# Patient Record
Sex: Female | Born: 1935 | Race: Black or African American | Hispanic: No | Smoking: Never smoker
Health system: Southern US, Community
[De-identification: ages and names within clinical notes are randomized; demographics above are authoritative.]

## PROBLEM LIST (undated history)

## (undated) DIAGNOSIS — R29898 Other symptoms and signs involving the musculoskeletal system: Secondary | ICD-10-CM

## (undated) DIAGNOSIS — R42 Dizziness and giddiness: Secondary | ICD-10-CM

## (undated) DIAGNOSIS — I82409 Acute embolism and thrombosis of unspecified deep veins of unspecified lower extremity: Secondary | ICD-10-CM

## (undated) DIAGNOSIS — H269 Unspecified cataract: Secondary | ICD-10-CM

## (undated) DIAGNOSIS — Z950 Presence of cardiac pacemaker: Secondary | ICD-10-CM

## (undated) DIAGNOSIS — I5032 Chronic diastolic (congestive) heart failure: Secondary | ICD-10-CM

## (undated) DIAGNOSIS — M1990 Unspecified osteoarthritis, unspecified site: Secondary | ICD-10-CM

## (undated) DIAGNOSIS — I471 Supraventricular tachycardia, unspecified: Secondary | ICD-10-CM

## (undated) DIAGNOSIS — N183 Chronic kidney disease, stage 3 unspecified: Secondary | ICD-10-CM

## (undated) DIAGNOSIS — I5042 Chronic combined systolic (congestive) and diastolic (congestive) heart failure: Secondary | ICD-10-CM

## (undated) DIAGNOSIS — C801 Malignant (primary) neoplasm, unspecified: Secondary | ICD-10-CM

## (undated) DIAGNOSIS — R931 Abnormal findings on diagnostic imaging of heart and coronary circulation: Secondary | ICD-10-CM

## (undated) DIAGNOSIS — I340 Nonrheumatic mitral (valve) insufficiency: Secondary | ICD-10-CM

## (undated) DIAGNOSIS — I10 Essential (primary) hypertension: Secondary | ICD-10-CM

## (undated) DIAGNOSIS — I3139 Other pericardial effusion (noninflammatory): Secondary | ICD-10-CM

## (undated) DIAGNOSIS — E669 Obesity, unspecified: Secondary | ICD-10-CM

## (undated) DIAGNOSIS — D696 Thrombocytopenia, unspecified: Secondary | ICD-10-CM

## (undated) DIAGNOSIS — I313 Pericardial effusion (noninflammatory): Secondary | ICD-10-CM

## (undated) DIAGNOSIS — D689 Coagulation defect, unspecified: Secondary | ICD-10-CM

## (undated) DIAGNOSIS — I447 Left bundle-branch block, unspecified: Secondary | ICD-10-CM

## (undated) DIAGNOSIS — I639 Cerebral infarction, unspecified: Secondary | ICD-10-CM

## (undated) DIAGNOSIS — E119 Type 2 diabetes mellitus without complications: Secondary | ICD-10-CM

## (undated) DIAGNOSIS — D649 Anemia, unspecified: Secondary | ICD-10-CM

## (undated) HISTORY — DX: Thrombocytopenia, unspecified: D69.6

## (undated) HISTORY — DX: Obesity, unspecified: E66.9

## (undated) HISTORY — DX: Supraventricular tachycardia, unspecified: I47.10

## (undated) HISTORY — DX: Other symptoms and signs involving the musculoskeletal system: R29.898

## (undated) HISTORY — DX: Anemia, unspecified: D64.9

## (undated) HISTORY — DX: Unspecified cataract: H26.9

## (undated) HISTORY — DX: Presence of cardiac pacemaker: Z95.0

## (undated) HISTORY — DX: Coagulation defect, unspecified: D68.9

## (undated) HISTORY — DX: Nonrheumatic mitral (valve) insufficiency: I34.0

## (undated) HISTORY — DX: Type 2 diabetes mellitus without complications: E11.9

## (undated) HISTORY — DX: Essential (primary) hypertension: I10

## (undated) HISTORY — DX: Abnormal findings on diagnostic imaging of heart and coronary circulation: R93.1

## (undated) HISTORY — DX: Unspecified osteoarthritis, unspecified site: M19.90

## (undated) HISTORY — DX: Other pericardial effusion (noninflammatory): I31.39

## (undated) HISTORY — DX: Chronic combined systolic (congestive) and diastolic (congestive) heart failure: I50.42

## (undated) HISTORY — DX: Chronic kidney disease, stage 3 unspecified: N18.30

## (undated) HISTORY — DX: Pericardial effusion (noninflammatory): I31.3

## (undated) HISTORY — DX: Supraventricular tachycardia: I47.1

## (undated) HISTORY — DX: Left bundle-branch block, unspecified: I44.7

## (undated) HISTORY — PX: EYE SURGERY: SHX253

## (undated) HISTORY — DX: Chronic kidney disease, stage 3 (moderate): N18.3

## (undated) HISTORY — DX: Chronic diastolic (congestive) heart failure: I50.32

---

## 2004-10-08 ENCOUNTER — Emergency Department (HOSPITAL_COMMUNITY): Admission: EM | Admit: 2004-10-08 | Discharge: 2004-10-08 | Payer: Self-pay | Admitting: Emergency Medicine

## 2005-09-05 ENCOUNTER — Encounter: Admission: RE | Admit: 2005-09-05 | Discharge: 2005-09-05 | Payer: Self-pay | Admitting: Gastroenterology

## 2005-09-13 ENCOUNTER — Encounter: Admission: RE | Admit: 2005-09-13 | Discharge: 2005-09-13 | Payer: Self-pay | Admitting: Gastroenterology

## 2005-09-21 ENCOUNTER — Encounter (INDEPENDENT_AMBULATORY_CARE_PROVIDER_SITE_OTHER): Payer: Self-pay | Admitting: Specialist

## 2005-09-21 ENCOUNTER — Ambulatory Visit (HOSPITAL_COMMUNITY): Admission: RE | Admit: 2005-09-21 | Discharge: 2005-09-21 | Payer: Self-pay | Admitting: Gastroenterology

## 2005-10-25 ENCOUNTER — Emergency Department (HOSPITAL_COMMUNITY): Admission: EM | Admit: 2005-10-25 | Discharge: 2005-10-25 | Payer: Self-pay | Admitting: Family Medicine

## 2006-08-25 ENCOUNTER — Emergency Department (HOSPITAL_COMMUNITY): Admission: EM | Admit: 2006-08-25 | Discharge: 2006-08-25 | Payer: Self-pay | Admitting: Family Medicine

## 2007-06-06 ENCOUNTER — Inpatient Hospital Stay (HOSPITAL_BASED_OUTPATIENT_CLINIC_OR_DEPARTMENT_OTHER): Admission: RE | Admit: 2007-06-06 | Discharge: 2007-06-06 | Payer: Self-pay | Admitting: Interventional Cardiology

## 2008-03-07 ENCOUNTER — Ambulatory Visit (HOSPITAL_COMMUNITY)
Admission: RE | Admit: 2008-03-07 | Discharge: 2008-03-07 | Payer: Self-pay | Admitting: Physical Medicine and Rehabilitation

## 2008-05-20 ENCOUNTER — Ambulatory Visit (HOSPITAL_COMMUNITY): Admission: RE | Admit: 2008-05-20 | Discharge: 2008-05-20 | Payer: Self-pay | Admitting: Gastroenterology

## 2008-11-10 ENCOUNTER — Encounter
Admission: RE | Admit: 2008-11-10 | Discharge: 2008-11-10 | Payer: Self-pay | Admitting: Physical Medicine and Rehabilitation

## 2008-12-10 ENCOUNTER — Inpatient Hospital Stay (HOSPITAL_COMMUNITY): Admission: EM | Admit: 2008-12-10 | Discharge: 2008-12-14 | Payer: Self-pay | Admitting: Emergency Medicine

## 2008-12-11 ENCOUNTER — Encounter (INDEPENDENT_AMBULATORY_CARE_PROVIDER_SITE_OTHER): Payer: Self-pay | Admitting: Internal Medicine

## 2009-01-08 ENCOUNTER — Ambulatory Visit: Payer: Self-pay | Admitting: Internal Medicine

## 2009-01-08 ENCOUNTER — Encounter: Payer: Self-pay | Admitting: Internal Medicine

## 2009-02-04 ENCOUNTER — Encounter: Payer: Self-pay | Admitting: Internal Medicine

## 2009-02-04 ENCOUNTER — Ambulatory Visit: Payer: Self-pay | Admitting: Internal Medicine

## 2009-02-16 ENCOUNTER — Emergency Department (HOSPITAL_COMMUNITY): Admission: EM | Admit: 2009-02-16 | Discharge: 2009-02-16 | Payer: Self-pay | Admitting: Emergency Medicine

## 2009-02-18 ENCOUNTER — Encounter: Payer: Self-pay | Admitting: Internal Medicine

## 2009-03-24 ENCOUNTER — Ambulatory Visit: Payer: Self-pay | Admitting: Internal Medicine

## 2009-03-24 LAB — CONVERTED CEMR LAB
BUN: 26 mg/dL — ABNORMAL HIGH (ref 6–23)
Basophils Absolute: 0 10*3/uL (ref 0.0–0.1)
Basophils Relative: 1 % (ref 0.0–3.0)
CO2: 29 meq/L (ref 19–32)
Calcium: 9.6 mg/dL (ref 8.4–10.5)
Chloride: 112 meq/L (ref 96–112)
Creatinine, Ser: 0.9 mg/dL (ref 0.4–1.2)
Eosinophils Absolute: 0.1 10*3/uL (ref 0.0–0.7)
Eosinophils Relative: 3.2 % (ref 0.0–5.0)
GFR calc non Af Amer: 78.69 mL/min (ref >60)
Glucose, Bld: 137 mg/dL — ABNORMAL HIGH (ref 70–99)
HCT: 30.8 % — ABNORMAL LOW (ref 36.0–46.0)
Hemoglobin: 10.5 g/dL — ABNORMAL LOW (ref 12.0–15.0)
INR: 1 (ref 0.8–1.0)
Lymphocytes Relative: 31.7 % (ref 12.0–46.0)
Lymphs Abs: 1.4 10*3/uL (ref 0.7–4.0)
MCHC: 34.2 g/dL (ref 30.0–36.0)
MCV: 90.2 fL (ref 78.0–100.0)
Monocytes Absolute: 0.4 10*3/uL (ref 0.1–1.0)
Monocytes Relative: 9.1 % (ref 3.0–12.0)
Neutro Abs: 2.5 10*3/uL (ref 1.4–7.7)
Neutrophils Relative %: 55 % (ref 43.0–77.0)
Platelets: 137 10*3/uL — ABNORMAL LOW (ref 150.0–400.0)
Potassium: 4.6 meq/L (ref 3.5–5.1)
Prothrombin Time: 10.4 s — ABNORMAL LOW (ref 10.9–13.3)
RBC: 3.41 M/uL — ABNORMAL LOW (ref 3.87–5.11)
RDW: 13.3 % (ref 11.5–14.6)
Sodium: 144 meq/L (ref 135–145)
WBC: 4.4 10*3/uL — ABNORMAL LOW (ref 4.5–10.5)
aPTT: 23.9 s (ref 21.7–28.8)

## 2009-03-30 ENCOUNTER — Ambulatory Visit: Payer: Self-pay | Admitting: Internal Medicine

## 2009-03-30 ENCOUNTER — Observation Stay (HOSPITAL_COMMUNITY): Admission: RE | Admit: 2009-03-30 | Discharge: 2009-03-31 | Payer: Self-pay | Admitting: Internal Medicine

## 2009-04-07 ENCOUNTER — Telehealth: Payer: Self-pay | Admitting: Internal Medicine

## 2009-04-29 ENCOUNTER — Ambulatory Visit: Payer: Self-pay | Admitting: Internal Medicine

## 2010-06-25 ENCOUNTER — Encounter (INDEPENDENT_AMBULATORY_CARE_PROVIDER_SITE_OTHER): Payer: Self-pay | Admitting: Emergency Medicine

## 2010-06-25 ENCOUNTER — Emergency Department (HOSPITAL_COMMUNITY): Admission: EM | Admit: 2010-06-25 | Discharge: 2010-06-25 | Payer: Self-pay | Admitting: Emergency Medicine

## 2010-06-25 ENCOUNTER — Ambulatory Visit: Payer: Self-pay | Admitting: Vascular Surgery

## 2010-10-13 ENCOUNTER — Encounter: Payer: Self-pay | Admitting: Internal Medicine

## 2010-10-13 ENCOUNTER — Ambulatory Visit: Payer: Self-pay | Admitting: Internal Medicine

## 2010-11-22 ENCOUNTER — Ambulatory Visit
Admission: RE | Admit: 2010-11-22 | Discharge: 2010-11-22 | Payer: Self-pay | Source: Home / Self Care | Attending: Internal Medicine | Admitting: Internal Medicine

## 2010-11-25 NOTE — Assessment & Plan Note (Signed)
Summary: sinus bradycardia,svt, r/o a-fib/mt   Referring Tris Howell:  Casandra Doffing, MD Primary [place]:  Dr.Rodin Baird Cancer   History of Present Illness: The patient presents today for electrophysiology followup.  She previously underwent SVT ablation by me 6/10.  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  She underwent slow pathway modification and had no inducible arrhythmias following ablation.   She did well initially following ablation.  Recently, the patient has developed recurrent sympotoms of "heart racing".  she describes episodes as gradual in onset and termination.  She had an event monitor placed by Dr Irish Lack which reveals wide complex tachycardia (she has a baseline LBBB).  She otherwise appears to be doing well.  She denies symptoms of chest pain, shortness of breath, orthopnea, PND,  dizziness, presyncope, syncope, or neurologic sequela. She feels that her chronic LE edema is stable.  The patient is tolerating medications without difficulties and is otherwise without complaint today.   Current Medications (verified): 1)  Calcium 600+d Plus Minerals 600-400 Mg-Unit Tabs (Calcium Carbonate-Vit D-Min) .... Take 1 Tablet By Mouth Once A Day 2)  Gabapentin 300 Mg Caps (Gabapentin) .... Take 1 Capsule By Mouth Once A Day 3)  Metformin Hcl 500 Mg Tabs (Metformin Hcl) .... Take Two Tablets Two Times A Day 4)  Lisinopril 20 Mg Tabs (Lisinopril) .... Take 1 Tablet By Mouth Once A Day 5)  Oxybutynin Chloride 5 Mg Xr24h-Tab (Oxybutynin Chloride) .... Take 1 Tablet By Mouth Two Times A Day 6)  Felodipine 10 Mg Xr24h-Tab (Felodipine) .... Once Daily 7)  Nexium 40mg  Tablet .... Take 1 Tablet By Mouth Once A Day 8)  Magnesium Oxide Tablet .... Take 1 Tablet By Mouth Once A Day 9)  Lasix 20 Mg Tabs (Furosemide) .... Take 1 Tablet By Mouth Once A Day 10)  Potassium Chloride Crys Cr 10 Meq Cr-Tabs (Potassium Chloride Crys Cr) .... Take 1 Tablet By Mouth Once A  Day 11)  Januvia 100 Mg Tabs (Sitagliptin Phosphate) .... Take 1 Tablet By Mouth Once A Day 12)  Glimepiride 2 Mg Tabs (Glimepiride) .... Take 1 Tablet By Mouth Two Times A Day 13)  Atenolol 50 Mg Tabs (Atenolol) .... Take 1/2 Tablet P.o. Once Daily 14)  Welchol 625 Mg Tabs (Colesevelam Hcl) .... Take 1 Tablet By Mouth Daily With Meals 15)  Lipitor 20 Mg Tabs (Atorvastatin Calcium) .... Take One Tablet By Mouth Daily.  Allergies: 1)  ! Aspirin 2)  ! Penicillin 3)  ! * No Ivp Dye Allergy 4)  ! * No Shellfish Allergy 5)  ! * No Latex Allergy  Past History:  Past Medical History: Reviewed history from 04/29/2009 and no changes required.  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 Hypertension.   3. Diabetes.   4. Peripheral vascular disease.   5. Osteoarthritis.   6. LBBB  7. Dyslipidemia.   8. Normocytic anemia.   9. Cholelithiasis  Past Surgical History: Reviewed history from 04/29/2009 and no changes required.  hysterectomy  Esophagogastroduodenoscopy with one gastric biopsy.09-21-2005 Heart Cath 8/08- no CAD Slow pathway modification for AVNRT 03/30/09  Social History: Reviewed history from 01/08/2009 and no changes required.  She lives independently in [place].  Does not use alcohol or   tobacco.   Review of Systems       All systems are reviewed and negative except as listed in the HPI.   Vital Signs:  Patient profile:   77 year old  female Height:      62 inches Weight:      163 pounds BMI:     29.92 BP sitting:   148 / 64  (left arm)  Vitals Entered By: Margaretmary Bayley CMA (October 13, 2010 10:37 AM)  Physical Exam  General:  Well developed, well nourished, in no acute distress. Head:  normocephalic and atraumatic Eyes:  PERRLA/EOM intact; conjunctiva and lids normal. Mouth:  Teeth, gums and palate normal. Oral mucosa normal. Neck:  Neck supple, no JVD. No masses, thyromegaly or abnormal cervical nodes. Lungs:  Clear bilaterally to  auscultation and percussion. Heart:  regular rate and rhythm, 2/6 early peaking systolic murmur along the left upper sternal border Abdomen:  Bowel sounds positive; abdomen soft and non-tender without masses, organomegaly, or hernias noted. No hepatosplenomegaly. Msk:  Back normal, normal gait. Muscle strength and tone normal. Extremities:  No clubbing or cyanosis. 2+ bilateral lower extremity edema Neurologic:  cranial nerves II through XII are intact, strength and sensation are intact   EKG  Procedure date:  10/13/2010  Findings:      sinus rhythm 64 bpm, LBBB  Impression & Recommendations:  Problem # 1:  SUPRAVENTRICULAR TACHYCARDIA (ICD-427.89) The patient is s/p AVNRT ablation 03/30/09.   She now presents with episodes of "heart racing" and has a wide complex tachycardia documented by event monitor.  She has a LBBB at baseline and tachycardia is likely SVT by mechanism.  This could possibly represent recurrence of AVNRT or another arrhythmia.  Therapeutic strategies for SVT including medicine and ablation were discussed in detail with the patient today. At this time, she is very clear that she wishes for medicine therapy.  I have therefore increased atenolol to 50mg  daily.  We will continue to titrate atenolol as HR and BP allow.  Problem # 2:  HYPERTENSION, BENIGN (ICD-401.1) stable increase atenolol given lower extremity edema, would consider stopping CCB down the road  Problem # 3:  LBBB (ICD-426.3) stable  Patient Instructions: 1)  Your physician recommends that you schedule a follow-up appointment in: 4-6 weeks with Dr Rayann Heman 2)  Your physician has recommended you make the following change in your medication: increase Atenolol to 50mg  daily Prescriptions: ATENOLOL 50 MG TABS (ATENOLOL) take 1 tablet p.o. once daily  #30 x 6   Entered by:   Janan Halter, RN, BSN   Authorized by:   Thompson Grayer, MD   Signed by:   Janan Halter, RN, BSN on 10/13/2010   Method used:    Electronically to        [facility]. [phone]* (retail)       [address], [place]  [address]       Ph: [phone]       Fax: [phone]   [place]:   [id number]

## 2010-12-01 NOTE — Assessment & Plan Note (Signed)
Summary: per check out/sf   Visit Type:  Follow-up Referring Provider:  Casandra Doffing, MD Primary Provider:  Dr.Rodin Baird [facility]   History of Present Illness: The patient presents today for routine electrophysiology followup. She reports doing very well since last being seen in our clinic.  She has had no further SVT with atenolol 50mg  daily.  She has stable edema.  The patient denies symptoms of palpitations, chest pain, shortness of breath, orthopnea, PND,dizziness, presyncope, syncope, or neurologic sequela. The patient is tolerating medications without difficulties and is otherwise without complaint today.   Current Medications (verified): 1)  Calcium 600+d Plus Minerals 600-400 Mg-Unit Tabs (Calcium Carbonate-Vit D-Min) .... Take 1 Tablet By Mouth Once A Day 2)  Gabapentin 300 Mg Caps (Gabapentin) .... Take 1 Capsule By Mouth Once A Day 3)  Metformin Hcl 500 Mg Tabs (Metformin Hcl) .... Take Two Tablets Two Times A Day 4)  Lisinopril 20 Mg Tabs (Lisinopril) .... Take 1 Tablet By Mouth Once A Day 5)  Oxybutynin Chloride 5 Mg Xr24h-Tab (Oxybutynin Chloride) .... Take 1 Tablet By Mouth Two Times A Day 6)  Felodipine 10 Mg Xr24h-Tab (Felodipine) .... Once Daily 7)  Nexium 40mg  Tablet .... Take 1 Tablet By Mouth Once A Day 8)  Magnesium Oxide Tablet .... Take 1 Tablet By Mouth Once A Day 9)  Lasix 20 Mg Tabs (Furosemide) .... Take 1 Tablet By Mouth Once A Day 10)  Potassium Chloride Crys Cr 10 Meq Cr-Tabs (Potassium Chloride Crys Cr) .... Take 1 Tablet By Mouth Once A Day 11)  Januvia 100 Mg Tabs (Sitagliptin Phosphate) .... Take 1 Tablet By Mouth Once A Day 12)  Glimepiride 2 Mg Tabs (Glimepiride) .... Take 1 Tablet By Mouth Two Times A Day 13)  Atenolol 50 Mg Tabs (Atenolol) .... Take 1 Tablet P.o. Once Daily 14)  Welchol 3.75 Gm Pack (Colesevelam Hcl) .... Once Daily 15)  Lipitor 20 Mg Tabs (Atorvastatin Calcium) .... Take One Tablet By Mouth Daily. 16)  Ferrous Sulfate 325 (65 Fe) Mg Tabs  (Ferrous Sulfate) .... Once Daily  Allergies: 1)  ! Aspirin 2)  ! Penicillin 3)  ! * No Ivp Dye Allergy 4)  ! * No Shellfish Allergy 5)  ! 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 Hypertension.   3. Diabetes.   4. Peripheral vascular disease.   5. Osteoarthritis.   6. LBBB  7. Dyslipidemia.   8. Normocytic anemia.   9. Cholelithiasis  Past Surgical History: Reviewed history from 04/29/2009 and no changes required.  hysterectomy  Esophagogastroduodenoscopy with one gastric biopsy.09-21-2005 Heart Cath 8/08- no CAD Slow pathway modification for AVNRT 03/30/09  Social History: Reviewed history from 01/08/2009 and no changes required.  She lives independently in [place].  Does not use alcohol or   tobacco.   Vital Signs:  Patient profile:   77 year old female Height:      62 inches Weight:      162 pounds BMI:     29.74 Pulse rate:   68 / minute BP sitting:   152 / 82  (left arm)  Vitals Entered By: Margaretmary Bayley CMA (November 22, 2010 10:34 AM)  Physical Exam  General:  Well developed, well nourished, in no acute distress. Head:  normocephalic and atraumatic Eyes:  PERRLA/EOM intact; conjunctiva and lids normal. Mouth:  Teeth, gums and palate normal. Oral mucosa normal. Neck:  Neck supple  Lungs:  Clear bilaterally to auscultation and percussion. Heart:  regular rate and rhythm, 2/6 early peaking systolic murmur along the left upper sternal border Abdomen:  Bowel sounds positive; abdomen soft and non-tender without masses, organomegaly, or hernias noted. No hepatosplenomegaly. Msk:  Back normal, normal gait. Muscle strength and tone normal. Extremities:  No clubbing or cyanosis. 2+ bilateral lower extremity edema Neurologic:  cranial nerves II through XII are intact, strength and sensation are intact Skin:  Intact without lesions or rashes.   Impression & Recommendations:  Problem # 1:   SUPRAVENTRICULAR TACHYCARDIA (ICD-427.89) stable The patient is s/p AVNRT ablation 03/30/09.   She now presents with episodes of "heart racing" and has a wide complex tachycardia documented by event monitor.  She has a LBBB at baseline and tachycardia is likely SVT by mechanism.  This could possibly represent recurrence of AVNRT or another arrhythmia. This is now controlled with atenolol 50mg  daily.  She is very clear in her desire to avoid further ablation at this time. No further workup is planned at this time.  Problem # 2:  HYPERTENSION, BENIGN (ICD-401.1) stable salt restriction  BLE edema likely worsened by felodipine,  as BP remains elevated, I would not make any changes today she will continue to follow with Dr Irish Lack  Patient Instructions: 1)  Your physician wants you to follow-up in: 6 months with Dr Vallery Ridge will receive a reminder letter in the mail two months in advance. If you don't receive a letter, please call our office to schedule the follow-up appointment. 2)  Your physician recommends that you continue on your current medications as directed. Please refer to the Current Medication list given to you today.

## 2011-01-31 LAB — GLUCOSE, CAPILLARY
Glucose-Capillary: 100 mg/dL — ABNORMAL HIGH (ref 70–99)
Glucose-Capillary: 114 mg/dL — ABNORMAL HIGH (ref 70–99)
Glucose-Capillary: 147 mg/dL — ABNORMAL HIGH (ref 70–99)
Glucose-Capillary: 158 mg/dL — ABNORMAL HIGH (ref 70–99)
Glucose-Capillary: 170 mg/dL — ABNORMAL HIGH (ref 70–99)
Glucose-Capillary: 214 mg/dL — ABNORMAL HIGH (ref 70–99)

## 2011-02-02 LAB — CBC
HCT: 30.7 % — ABNORMAL LOW (ref 36.0–46.0)
Hemoglobin: 10.6 g/dL — ABNORMAL LOW (ref 12.0–15.0)
MCHC: 34.5 g/dL (ref 30.0–36.0)
MCV: 90.4 fL (ref 78.0–100.0)
Platelets: 121 10*3/uL — ABNORMAL LOW (ref 150–400)
RBC: 3.4 MIL/uL — ABNORMAL LOW (ref 3.87–5.11)
RDW: 13.7 % (ref 11.5–15.5)
WBC: 5 10*3/uL (ref 4.0–10.5)

## 2011-02-02 LAB — COMPREHENSIVE METABOLIC PANEL
ALT: 16 U/L (ref 0–35)
AST: 21 U/L (ref 0–37)
Albumin: 3.6 g/dL (ref 3.5–5.2)
Alkaline Phosphatase: 59 U/L (ref 39–117)
BUN: 22 mg/dL (ref 6–23)
CO2: 24 mEq/L (ref 19–32)
Calcium: 9.3 mg/dL (ref 8.4–10.5)
Chloride: 106 mEq/L (ref 96–112)
Creatinine, Ser: 0.95 mg/dL (ref 0.4–1.2)
GFR calc Af Amer: >60 mL/min (ref >60)
GFR calc non Af Amer: 58 mL/min — ABNORMAL LOW (ref >60)
Glucose, Bld: 163 mg/dL — ABNORMAL HIGH (ref 70–99)
Potassium: 3.8 mEq/L (ref 3.5–5.1)
Sodium: 140 mEq/L (ref 135–145)
Total Bilirubin: 0.6 mg/dL (ref 0.3–1.2)
Total Protein: 7.3 g/dL (ref 6.0–8.3)

## 2011-02-02 LAB — DIFFERENTIAL
Basophils Absolute: 0.1 10*3/uL (ref 0.0–0.1)
Basophils Relative: 2 % — ABNORMAL HIGH (ref 0–1)
Eosinophils Absolute: 0.1 10*3/uL (ref 0.0–0.7)
Eosinophils Relative: 3 % (ref 0–5)
Lymphocytes Relative: 37 % (ref 12–46)
Lymphs Abs: 1.9 10*3/uL (ref 0.7–4.0)
Monocytes Absolute: 0.4 10*3/uL (ref 0.1–1.0)
Monocytes Relative: 9 % (ref 3–12)
Neutro Abs: 2.4 10*3/uL (ref 1.7–7.7)
Neutrophils Relative %: 49 % (ref 43–77)

## 2011-02-02 LAB — POCT CARDIAC MARKERS
CKMB, poc: <1 ng/mL — ABNORMAL LOW (ref 1.0–8.0)
Myoglobin, poc: 50.6 ng/mL (ref 12–200)
Troponin i, poc: <0.05 ng/mL (ref 0.00–0.09)

## 2011-02-02 LAB — POCT I-STAT, CHEM 8
BUN: 25 mg/dL — ABNORMAL HIGH (ref 6–23)
Calcium, Ion: 1.14 mmol/L (ref 1.12–1.32)
Chloride: 108 mEq/L (ref 96–112)
Creatinine, Ser: 1.1 mg/dL (ref 0.4–1.2)
Glucose, Bld: 165 mg/dL — ABNORMAL HIGH (ref 70–99)
HCT: 32 % — ABNORMAL LOW (ref 36.0–46.0)
Hemoglobin: 10.9 g/dL — ABNORMAL LOW (ref 12.0–15.0)
Potassium: 3.8 mEq/L (ref 3.5–5.1)
Sodium: 142 mEq/L (ref 135–145)
TCO2: 26 mmol/L (ref 0–100)

## 2011-02-02 LAB — BRAIN NATRIURETIC PEPTIDE: Pro B Natriuretic peptide (BNP): 149 pg/mL — ABNORMAL HIGH (ref 0.0–100.0)

## 2011-02-02 LAB — MAGNESIUM: Magnesium: 1.8 mg/dL (ref 1.5–2.5)

## 2011-02-08 LAB — CBC
HCT: 29.1 % — ABNORMAL LOW (ref 36.0–46.0)
HCT: 29.3 % — ABNORMAL LOW (ref 36.0–46.0)
Hemoglobin: 10 g/dL — ABNORMAL LOW (ref 12.0–15.0)
Hemoglobin: 11.8 g/dL — ABNORMAL LOW (ref 12.0–15.0)
Hemoglobin: 9.9 g/dL — ABNORMAL LOW (ref 12.0–15.0)
MCHC: 33 g/dL (ref 30.0–36.0)
MCHC: 33.9 g/dL (ref 30.0–36.0)
MCHC: 34.3 g/dL (ref 30.0–36.0)
MCV: 91.3 fL (ref 78.0–100.0)
Platelets: 141 10*3/uL — ABNORMAL LOW (ref 150–400)
RBC: 3.21 MIL/uL — ABNORMAL LOW (ref 3.87–5.11)
RDW: 13.1 % (ref 11.5–15.5)
RDW: 13.9 % (ref 11.5–15.5)

## 2011-02-08 LAB — CARDIAC PANEL(CRET KIN+CKTOT+MB+TROPI)
CK, MB: 2.3 ng/mL (ref 0.3–4.0)
CK, MB: 2.3 ng/mL (ref 0.3–4.0)
CK, MB: 3.1 ng/mL (ref 0.3–4.0)
Relative Index: 1.5 (ref 0.0–2.5)
Relative Index: 1.8 (ref 0.0–2.5)
Relative Index: 2.4 (ref 0.0–2.5)
Total CK: 127 U/L (ref 7–177)
Total CK: 130 U/L (ref 7–177)
Total CK: 149 U/L (ref 7–177)
Troponin I: 0.04 ng/mL (ref 0.00–0.06)
Troponin I: 0.07 ng/mL — ABNORMAL HIGH (ref 0.00–0.06)
Troponin I: 0.13 ng/mL — ABNORMAL HIGH (ref 0.00–0.06)

## 2011-02-08 LAB — COMPREHENSIVE METABOLIC PANEL
ALT: 14 U/L (ref 0–35)
BUN: 17 mg/dL (ref 6–23)
CO2: 29 mEq/L (ref 19–32)
Calcium: 8.8 mg/dL (ref 8.4–10.5)
GFR calc non Af Amer: 57 mL/min — ABNORMAL LOW (ref >60)
Glucose, Bld: 82 mg/dL (ref 70–99)
Sodium: 139 mEq/L (ref 135–145)

## 2011-02-08 LAB — DIFFERENTIAL
Basophils Absolute: 0.1 10*3/uL (ref 0.0–0.1)
Basophils Relative: 1 % (ref 0–1)
Lymphocytes Relative: 39 % (ref 12–46)
Monocytes Absolute: 0.4 10*3/uL (ref 0.1–1.0)
Neutro Abs: 2.9 10*3/uL (ref 1.7–7.7)

## 2011-02-08 LAB — GLUCOSE, CAPILLARY
Glucose-Capillary: 104 mg/dL — ABNORMAL HIGH (ref 70–99)
Glucose-Capillary: 118 mg/dL — ABNORMAL HIGH (ref 70–99)
Glucose-Capillary: 121 mg/dL — ABNORMAL HIGH (ref 70–99)
Glucose-Capillary: 130 mg/dL — ABNORMAL HIGH (ref 70–99)
Glucose-Capillary: 131 mg/dL — ABNORMAL HIGH (ref 70–99)
Glucose-Capillary: 75 mg/dL (ref 70–99)

## 2011-02-08 LAB — APTT: aPTT: 26 seconds (ref 24–37)

## 2011-02-08 LAB — FOLATE: Folate: 15.4 ng/mL

## 2011-02-08 LAB — POCT I-STAT, CHEM 8
BUN: 20 mg/dL (ref 6–23)
Chloride: 109 mEq/L (ref 96–112)
Glucose, Bld: 160 mg/dL — ABNORMAL HIGH (ref 70–99)
HCT: 37 % (ref 36.0–46.0)
Potassium: 3.6 mEq/L (ref 3.5–5.1)

## 2011-02-08 LAB — LIPID PANEL
Cholesterol: 202 mg/dL — ABNORMAL HIGH (ref 0–200)
HDL: 78 mg/dL (ref >39)
LDL Cholesterol: 111 mg/dL — ABNORMAL HIGH (ref 0–99)
Total CHOL/HDL Ratio: 2.6 RATIO
Triglycerides: 64 mg/dL (ref <150)
VLDL: 13 mg/dL (ref 0–40)

## 2011-02-08 LAB — BASIC METABOLIC PANEL
CO2: 29 mEq/L (ref 19–32)
Calcium: 8.6 mg/dL (ref 8.4–10.5)
Glucose, Bld: 134 mg/dL — ABNORMAL HIGH (ref 70–99)
Potassium: 4.4 mEq/L (ref 3.5–5.1)
Sodium: 136 mEq/L (ref 135–145)

## 2011-02-08 LAB — RETICULOCYTES
RBC.: 3.23 MIL/uL — ABNORMAL LOW (ref 3.87–5.11)
Retic Count, Absolute: 29.1 10*3/uL (ref 19.0–186.0)
Retic Ct Pct: 0.9 % (ref 0.4–3.1)

## 2011-02-08 LAB — HEMOGLOBIN A1C
Hgb A1c MFr Bld: 7.4 % — ABNORMAL HIGH (ref 4.6–6.1)
Mean Plasma Glucose: 166 mg/dL

## 2011-02-08 LAB — POCT CARDIAC MARKERS
CKMB, poc: 1.7 ng/mL (ref 1.0–8.0)
Troponin i, poc: <0.05 ng/mL (ref 0.00–0.09)

## 2011-02-08 LAB — BRAIN NATRIURETIC PEPTIDE
Pro B Natriuretic peptide (BNP): 108 pg/mL — ABNORMAL HIGH (ref 0.0–100.0)
Pro B Natriuretic peptide (BNP): 417 pg/mL — ABNORMAL HIGH (ref 0.0–100.0)

## 2011-02-08 LAB — TSH: TSH: 0.793 u[IU]/mL (ref 0.350–4.500)

## 2011-02-08 LAB — PROTIME-INR: Prothrombin Time: 13.3 seconds (ref 11.6–15.2)

## 2011-03-08 NOTE — Cardiovascular Report (Signed)
NAMESHELINA, JUNGLING                  ACCOUNT NO.:  [id number]   MEDICAL RECORD NO.:  [id number]          PATIENT TYPE:  OIB   LOCATION:  1963                         FACILITY:  [facility]   PHYSICIAN:  Jettie Booze, MDDATE OF BIRTH:  [birth year]   DATE OF PROCEDURE:  06/06/2007  DATE OF DISCHARGE:                            CARDIAC CATHETERIZATION   REFERRING PHYSICIAN:  Dr. [name].   PROCEDURES PERFORMED:  Left heart catheterization, left ventriculogram,  coronary angiogram, abdominal aortogram.   OPERATOR:  Dr. Irish Lack.   INDICATIONS:  Abnormal stress test with chest pain and fatigue.   PROCEDURE NARRATIVE:  The risks and benefits of cardiac catheterization  were explained to the patient and informed consent was obtained.  She  was brought to the cath lab.  She was prepped and draped in the usual  sterile fashion.  Her right groin was infiltrated with 1% lidocaine.  A  4-French arterial sheath was placed into her right femoral artery using  the modified Seldinger technique.  Left coronary artery angiography was  performed using a JL-4 pigtail catheter.  The catheter was advanced to  the vessel ostium under fluoroscopic guidance.  Digital angiography was  performed in multiple projections using hand injection with contrast.  Right coronary artery angiography was then performed using a 3-DRC  catheter.  The catheter was advanced to the vessel ostium under  fluoroscopic guidance.  Digital angiography was performed in multiple  projections using hand injection with contrast.  Pigtail catheter was  then advanced to the ascending aorta and across the aortic valve.  The  catheter was placed into the left ventricle under fluoroscopic guidance.  A power injection with contrast was performed in the RAO projection.  Catheter was pulled back under continuous hemodynamic pressure  monitoring.  The catheter was then pulled back to the abdominal aorta  and power injection with contrast  was performed in the AP projection.  The sheath was removed using manual compression.   FINDINGS:  Left main was a large vessel and widely patent.  Left  anterior descending was a large vessel with mild irregularities.  The  first diagonal was a long medium-sized vessel with mild irregularities.  The circumflex was a large vessel with mild luminal irregularities.  There was a medium-sized OM-1 and OM-2 with mild irregularities.  The  right coronary artery was a large dominant vessel with mild  irregularities.  There was a large PDA and medium-sized posterolateral  branch, both of which were widely patent.  The left ventricle showed  mildly decreased left ventricular function with an estimated ejection  fraction of 45-50%.  The abdominal aortogram shows no abdominal aortic  aneurysm.  There are single renal arteries bilaterally, both of which  were widely patent.   HEMODYNAMIC RESULTS:  Left ventricular pressure 159/10 with an LVEDP of  12 mmHg.  Aortic pressure of 156/71 with a mean aortic pressure of 107  mmHg.   IMPRESSION:  1. No significant coronary artery disease.  2. Mildly decreased left ventricular function.  3. No renal artery stenosis.  4. No aortic valve gradient.  The patient was hypertensive.  Normal      left ventricular end-diastolic pressure of 12.   RECOMMENDATIONS:  1. The patient will be managed medically with increased blood pressure      control in the hopes of relieving her symptoms of fatigue and also      improving her LV function.  2. Will also check her thyroid function.  3. I will follow up with her in the office.      Jettie Booze, MD  Electronically Signed     JSV/MEDQ  D:  06/06/2007  T:  06/07/2007  Job:  [id number]

## 2011-03-08 NOTE — Discharge Summary (Signed)
[name], [name]                  ACCOUNT NO.:  [id number]   MEDICAL RECORD NO.:  [id number]          PATIENT TYPE:  INP   LOCATION:  [id number]                         [facility]ACILITY:  [facility]   PHYSICIAN:  Barbette Merino, M.D.      DATE OF BIRTH:  1933-03-07   DATE OF ADMISSION:  12/10/2008  DATE OF DISCHARGE:  12/14/2008                               DISCHARGE SUMMARY   PRIMARY CARE PHYSICIAN:  Dr. Quentin Cornwall at [facility].   CARDIOLOGIST:  Dr. Irish Lack, Cardiologist.   DISCHARGE DIAGNOSES:  1. Tachy-Brady syndrome with wide complex tachycardia, right bundle      branch block and demand ischemia.  2. Hypertension.  3. Diabetes.  4. Peripheral vascular disease.  5. Osteoarthritis.  6. History of congestive heart failure-diastolic dysfunction.  7. Dyslipidemia.  8. Normocytic anemia.  9. Peripheral vascular disease.   DISCHARGE MEDICATIONS:  1. Iron tablet, one daily.  2. Calcium 600 mg up with vitamin D 1 tablet daily.  3. Gabapentin 300 mg daily.  4. Metformin 500 mg two tablet b.i.d.  5. Lisinopril 20 mg daily.  6. Oxybutynin 5 mg twice a day.  7. Felodipine 5 mg daily.  8. Nexium 40 mg daily.  9. Magnesium oxide 0 mg daily.  10.Lasix 20 mg daily.  11.Potassium 10 mEq daily.  12.Januvia 100 mg daily.  13.Glimepiride 2 mg daily.  14.Zetia 10 mg daily.  15.Atenolol 50 mg half-tablet daily.   DISPOSITION:  The patient was discharged home to follow up with Dr.  Irish Lack at [place] [facility].  She was stable at the time of discharge, although  slightly bradycardic with heart rate 57.   The procedures performed included chest x-ray performed February 17 that  showed enlargement of the cardia.  Cardiac silhouette without edema or  focal air space consolidation.   CONSULTATION:  [facility], Dr. Verlon Setting on behalf of Dr. Irish Lack.   HISTORY AND PHYSICAL:  Please refer to  dictated history and physical by  Dr. Raynelle Dick .  It short however, the patient is a 77 year old female  admitted with sinus tachycardia.  The patient also had some diarrhea and  hypertension.  She had trace pedal edema.  Her EKG shows wide-complex  tachycardia with right bundle branch block.  The patient was  subsequently admitted for further management.   HOSPITAL COURSE:  1. Arrhythmias.  The patient was being followed by Dr. Irish Lack in the      office.  She was admitted and her beta blocker dose was adjusted      accordingly.  She had a 2-D echo that showed normal EF of 60%-65%.      Cardiology was consulted.  Workup included checking her      cholesterol, TSH, her BNP which was over 400 on admission,      hemoglobin A1c of 7.4.  At the end of all her workup, everything      seemed to be stable except that the patient was having tachy brady      arrhythmia.  Cardiology's recommendation was for the patient to  follow up with them as an outpatient.  2. Demand ischemia secondary to number one.  The patient was initially      and some chest discomfort but she felt better.  3. Hypertension.  Blood pressure was controlled on her home regimen.  4. Diabetes mainly diet control.  Her A1c was 7.4.  5. Peripheral vascular disease.  The patient was on [place] and she      was maintained on that.  Otherwise the patient was very much stable      and at the time of discharge and was to follow up with Dr.      Irish Lack.      Barbette Merino, M.D.  Electronically Signed     LG/MEDQ  D:  01/05/2009  T:  01/05/2009  Job:  [id number]

## 2011-03-08 NOTE — Op Note (Signed)
NAMEKORRIN, HABEGGER                  ACCOUNT NO.:  [id number]   MEDICAL RECORD NO.:  [id number]          PATIENT TYPE:  INP   LOCATION:  3729                         FACILITY:  [facility]   PHYSICIAN:  Thompson Grayer, MD       DATE OF BIRTH:  19-Feb-1933   DATE OF PROCEDURE:  DATE OF DISCHARGE:                               OPERATIVE REPORT   SURGEON:  Thompson Grayer, MD   PREPROCEDURE DIAGNOSIS:  Supraventricular tachycardia.   POSTPROCEDURE DIAGNOSES:  1. Typical atrioventricular nodal reentrant tachycardia with antegrade      conduction over the slow atrioventricular nodal pathway and      retrograde conduction over the fast pathway.  2. Atypical atrioventricular nodal reentrant tachycardia with      antegrade conduction over the slow atrioventricular nodal pathway      and retrograde conduction over the second slow pathway.   PROCEDURES:  1. Comprehensive electrophysiology study.  2. Coronary sinus pacing and recording.  3. Three-dimensional mapping of supraventricular tachycardia.  4. Ablation of supraventricular tachycardia.  5. Isoproterenol infusion.   DESCRIPTION OF PROCEDURE:  Informed written consent was obtained and the  patient was brought to the electrophysiology lab in the fasting state.  She was adequately sedated with intravenous Versed and fentanyl as  outlined in the nursing report.  The patient's right neck and groin were  prepped and draped in the usual sterile fashion by the EP lab staff.  Using a percutaneous Seldinger technique, one 6-French hemostasis sheath  was placed into the right internal jugular vein.  A 6-French curved  hexapolar Damato catheter was introduced through the right internal  jugular vein and advanced into the coronary sinus for recording and  pacing from this location.  One 6-French, one 7-French, and one 8-French  hemostasis sheaths were placed into the right common femoral vein.  Two  6-French quadripolar Josephson catheters were introduced  through the  right common femoral vein and advanced into the His bundle and right  ventricular apex positions respectively.  The patient presented to the  electrophysiology lab in sinus rhythm with a left bundle-branch block  QRS morphology.  The RR interval measured 1063 milliseconds with a PR  interval of 168 milliseconds, QT interval of 454 milliseconds, and QRS  duration of 149 milliseconds.  The AH interval measured 119 milliseconds  with an HV interval of 46 milliseconds.  Ventricular pacing was  performed, which revealed midline decremental VA conduction with a VA  Wenckebach cycle length of 520 milliseconds.  Ventricular extrastimulus  testing was performed, which revealed midline decremental VA conduction  with a red retrograde AV nodal ERP of 600/440 milliseconds.  Rapid  atrial pacing was performed, which revealed an AV Wenckebach cycle  length of 630 milliseconds.  The AV nodal ERP was 680/548 milliseconds.  There was no evidence of accessory pathways at baseline.  Tachycardias  could not be induced without isoproterenol infusion.  Isoproterenol was  therefore infused at 2 mcg per minute and ventricular pacing was again  performed, which revealed a VA Wenckebach cycle length of 500  milliseconds.  Atrial pacing was performed and the patient developed  immediate and incessant tachycardia.  The tachycardia cycle length  measured 360 milliseconds with a VA time of 130 milliseconds.  PVCs were  delivered during His refractoriness, which did not advance VA or affect  the tachycardia.  The tachycardia ended with an atrial activation.  VA  linking was observed.  Three-dimensional electroanatomical mapping was  performed and this tachycardia earliest atrial activation was found to  arise along the paraseptal area of the right atrium near site 10 in [place]  triangle.  A second tachycardia was also observed with a cycle length of  430 milliseconds with a VA time of 72 milliseconds which  was failed to  represent typical-appearing AV nodal reentrant tachycardia.  The surface  QRS was identical to the baseline left bundle-branch block.  Atrial  extrastimulus testing could not be performed during isoproterenol  infusion due to incessant tachycardia.  Isoproterenol was therefore  allowed to wash out.  A 7-French Biosense Webster EZ Steer 4-mm ablation  catheter, which had previously been used for three-dimensional mapping,  was positioned along the slow AV nodal pathway.  The patient was noted  to have a rather normal Koch triangle.  The ablation catheter was  positioned at site 10 in [place] triangle and a series of radiofrequency  applications were delivered with a target temperature of 50 degrees at  50 watts for 60 seconds.  Accelerated junctional rhythm with intact VA  conduction was observed during ablation.  Following ablation,  isoproterenol was again infused at 1.5 mcg per minute and the patient  was again observed to have tachycardia.  Isoproterenol was therefore  allowed to wash out.  A series additional radiofrequency applications  were delivered between sites 10 and 7 in [place] triangle with a target  temperature of 50 degrees at 50 watts.  Accelerated junctional rhythm  with intact VA conduction was again observed.  The patient had several  ventricular beats which were not conduct to the atrium and ablation was  discontinued.  Isoproterenol was again infused at 1.5 mcg per minute.  Atrial extrastimulus testing could now easily be performed, which  revealed decremental AV conduction with no AH jumps or echo beats.  The  atrial ERP was 600/330 milliseconds without tachycardia induced.  The  procedure was therefore considered completed.  All catheters were  removed and the sheaths were aspirated and flushed.  The sheaths were  removed and hemostasis was assured.  There were no early apparent  complications.   CONCLUSIONS:  1. Sinus rhythm with a left bundle-branch  block upon presentation.  2. Inducible typical AV nodal reentrant tachycardia and atypical AV      nodal reentrant tachycardia (slow - slow reentry).  3. Successful slow pathway modification.  4. No inducible arrhythmias following ablation.  5. No early apparent complications.      Thompson Grayer, MD  Electronically Signed     JA/MEDQ  D:  03/30/2009  T:  03/31/2009  Job:  [id number]   cc:   Jettie Booze, MD

## 2011-03-08 NOTE — H&P (Signed)
NAMEJULANA, [name]                  ACCOUNT NO.:  [id number]   MEDICAL RECORD NO.:  [id number]          PATIENT TYPE:  INP   LOCATION:  0110                         FACILITY:  [facility]   PHYSICIAN:  Carney Bern, MD     DATE OF BIRTH:  [birth year]   DATE OF ADMISSION:  12/10/2008  DATE OF DISCHARGE:                              HISTORY & PHYSICAL   PRIMARY CARE PHYSICIAN:  Dr. Quentin Cornwall at [facility].   CARDIOLOGIST:  Dr. Lendell Caprice at [place].   CHIEF COMPLAINT:  Tachycardia.   HISTORY OF PRESENT ILLNESS:  This is a 77 year old African American  female with a history of arrhythmia, followed by Dr. Irish Lack at [facility]  cardiology who presents with racing heart and lightheadedness.  She  reports she has been having episodes coming and going intermittently for  several days, but more importantly has had this for 2-3 months since her  atenolol was decreased from 50-25 mg daily.  She denies any chest pain  with these episodes, but does have some shortness of breath and  presyncope without frank syncope.  She does have stable lower extremity  edema.  No orthopnea or PND.  She has had diarrhea x2 days and notably  had started Neurontin in the last week.   She was given metoprolol 5 mg IV x1 in the emergency department for a  heart rate in the 160s with return of normal sinus rhythm in the 70s.   PAST MEDICAL HISTORY:  1. Irregular heartbeat.  2. Hypertension.  3. Hyperlipidemia.  4. Diabetes.  5. Peripheral vascular disease in the right leg.  6. Right knee arthritis.   ALLERGIES:  PENICILLIN AND ASPIRIN WHICH CAUSES A RASH.   MEDICATIONS:  1. Lasix 20 mg daily.  2. Neurontin 300 mg nightly, started last week.  3. Zetia 10 mg daily.  4. Felodipine 5 mg daily.  5. Metformin 500 mg 2 tablets b.i.d.  6. Lisinopril 20 mg daily.  7. Oxybutynin 5 mg b.i.d.  8. Potassium chloride 10 mEq daily.  9. Nexium 40 mg daily.  10.Calcium supplement.  11.Multivitamin once daily.  12.Januvia 100 mg daily.  13.Glimepiride 10 mg daily.  14.Atenolol 50 mg 1/2 tablet daily.  15.Magnesium oxide 400 mg daily.   FAMILY HISTORY:  Her mother is deceased of MI at age 42.  Her father is  deceased of prostate cancer.   SOCIAL HISTORY:  She lives independently.  Does not use alcohol or  tobacco.   REVIEW OF SYSTEMS:  Negative except as per HPI.   PHYSICAL EXAMINATION:  VITAL SIGNS:  Temperature is 97.9, blood pressure  149/95, heart rate on presentation 202, down to 80 at the time of my  exam.  Oxygen saturation 98% on room air.  GENERAL:  This is a an elderly African American female in no acute  distress.  EYES:  Extraocular muscles intact.  Sclerae anicteric.  ENT:  Mucous membranes are moist.  She has dentures.  Nares are clear.  NECK:  Supple without lymphadenopathy.  No thyromegaly.  No JVD.  CARDIOVASCULAR:  Heart rate is  regular with normal S1-S2.  There is no  S3-S4.  There is a 3/6 systolic murmur at the right sternal border which  radiates to the carotids.  RESPIRATORY:  There is bibasilar rales, but no increased work of  breathing.  ABDOMEN/GI:  Positive bowel sounds.  Soft, nontender, nondistended.  NEURO:  There is no focal deficits.  She is alert and oriented x3.  EXTREMITIES:  There is 1+ bilateral lower extremity edema.   STUDIES:  1. Chest x-ray without edema or infiltrate.  2. EKG 1 demonstrates SVT at a rate of 165 with a left bundle branch      block.  3. EKG 2 is normal sinus rhythm at a rate of 75, left lower branch      block.  There is LVH with secondary QRS widening and repolarization      abnormalities.   LABORATORY DATA:  Sodium 144, potassium 3.6, chloride 109, bicarb 23,  BUN 20, creatinine 0.9, glucose 160.  White blood cell count is 5.  Hemoglobin is 11.8, hematocrit 35.7 and platelets are 141.  INR is 1.0.  CK-MB is 1.7 and troponin is less than 0.05.   ASSESSMENT/PLAN:  This is a 77 year old female with history of  arrhythmia  presenting with supraventricular tachycardia.   1. Supraventricular tachycardia:  This may in fact have been triggered      by her diarrhea with dehydration, however, more likely has been      occurring intermittently for several months after decrease of her      atenolol.  We will monitor on telemetry overnight, check a TSH ,      increase her atenolol back to 50 mg daily and obtain an      echocardiogram.  We will check serial cardiac biomarkers, as well      as a BNP.  I will continue her Lasix, as she appears well hydrated      at this time.  We will maintain K greater than 4, Mg greater than      2.  Replete potassium at this time.  2. Diarrhea:  She reports that this discontinued today.  She appears      well hydrated and did receive some IV fluids in the emergency      department.  If this recurs, we will start a workup for her      diarrhea.  We will hold Neurontin as this was started in the last      week and may be the etiology.  3. Hypertension, elevated blood pressure on admission.  Will continue      her medicines as above with the increase      of atenolol.  4. Hyperlipidemia.  Check fasting lipids and continue home regimen.  5. This patient is full code.      Carney Bern, MD  Electronically Signed     HH/MEDQ  D:  12/10/2008  T:  12/10/2008  Job:  [id number]

## 2011-03-08 NOTE — Discharge Summary (Signed)
[name], [name]                  ACCOUNT NO.:  [id number]   MEDICAL RECORD NO.:  [id number]          PATIENT TYPE:  OBV   LOCATION:  [id number]                         FACILITY:  [facility]   PHYSICIAN:  Thompson Grayer, MD       DATE OF BIRTH:  June 24, 1933   DATE OF ADMISSION:  03/30/2009  DATE OF DISCHARGE:  03/31/2009                               DISCHARGE SUMMARY   PRIMARY CARDIOLOGIST:  Jettie Booze, MD   PRIMARY CAREGIVER:  Gwynn Burly.   FINAL DIAGNOSES:  1. Typical atrioventricular nodal reentrant tachycardia and atypical      (slow-slow) atrioventricular nodal reentrant tachycardia.   SECONDARY DIAGNOSES:  1. Left bundle-branch block.  2. Hypertension.  3. Diabetes.  4. Peripheral vascular disease.  5. Osteoarthritis.  6. Dyslipidemia.  7. Normocytic anemia.  8. Cholelithiasis.  9. History of esophagogastroduodenoscopy with one gastric biopsy.  10.History of left heart catheterization in August 2008, finding of no      significant coronary artery disease.   PROCEDURE:  March 30, 2009, electrophysiology study with finding of dual  atrioventricular nodal physiology with both typical and atypical  atrioventricular nodal reentrant tachycardia, slow pathway modification  rendered both non-inducible after ablation both on or off Isuprel, Dr.  Rayann Heman.   BRIEF HISTORY:  [name] is a 77 year old female.  She has a history of  symptomatic bradycardia.  She has left bundle-branch block and has been  documented to have an acute onset and termination of tachycardia with  left bundle-branch block morphology.  This is felt likely to represent a  supraventricular tachycardia.  The patient has had several episodes.  They are always abrupt in onset, abrupt in termination.  She has  symptoms with these.  They are diaphoresis, palpitations, and  presyncope.  The episodes last about 2-3 minutes, they resolve  spontaneously.  She cannot terminate episodes with vagal maneuvers.  Therapeutic options will be discussed today at office visit.   The patient had an echocardiogram in the past.  Her ejection fraction is  normal with the range between 60% and 65%.  No left ventricular wall  motion abnormalities.   Because the patient continues to have a symptomatic tachycardia, which  is wide complex but likely SVT.  Therapeutic strategies would include  medical therapy, which the patient already follows with some  breakthrough and ablation.  Risks and benefits of ablation have been  discussed with the patient.  She wishes to proceed.   HOSPITAL COURSE:  The patient presents electively on March 30, 2009.  She  underwent the electrophysiology study with ablation of both typical and  atypical AVNRT.  The patient has done well and is discharging on  postprocedure day #1.  She goes home on the following medication.  1. Glimepiride 2 mg daily.  2. Atenolol 50 mg tablets one-half tablet daily.  3. Welchol 625 mg daily.  4. Calcium 600 mg plus vitamin D daily.  5. Gabapentin 300 mg daily.  6. Metformin 500 mg tablets 2 tablets in the morning, 2 tablets in the      evening.  7. Lisinopril 20 mg daily.  8. Oxybutynin 5 mg twice daily.  9. Felodipine 5 mg daily.  10.Nexium 40 mg daily.  11.Magnesium oxide 400 mg daily.  12.Lasix 20 mg daily.  13.Potassium chloride 10 mEq daily.  14.Januvia 100 mg daily.  15.Nu-Iron 150 mg daily.   She follows up at [facility], to see Dr. Rayann Heman, Thursday, April 23, 2009, at 3:30.  Time  for this dictation and examination for the patient greater than 40  minutes.  I would like to mention that the patient has allergies above  aspirin and penicillin.   Lab studies this admission were drawn on March 24, 2009, sodium 144,  potassium 4.6, chloride 112, carbonate 29, glucose 137, BUN is 26,  creatinine 0.9.  White cells 4.4, hemoglobin 10.5, hematocrit 30.8,  platelets of 137, protime of 10.4, INR  1.0.      Sueanne Margarita, PA      Thompson Grayer, MD  Electronically Signed    GM/MEDQ  D:  03/31/2009  T:  04/01/2009  Job:  [id number]   cc:   Jettie Booze, MD  Gwynn Burly

## 2011-03-11 NOTE — Op Note (Signed)
NAME:  [name], [name]                  ACCOUNT NO.:  [id number]   MEDICAL RECORD NO.:  [id number]          PATIENT TYPE:  AMB   LOCATION:  ENDO                         FACILITY:  [facility]   PHYSICIAN:  Nelwyn Salisbury, M.D.  DATE OF BIRTH:  21-Jul-1933   DATE OF PROCEDURE:  09/21/2005  DATE OF DISCHARGE:                                 OPERATIVE REPORT   PROCEDURE:  Esophagogastroduodenoscopy with one gastric biopsy.   ENDOSCOPIST:  Nelwyn Salisbury, M.D.   INSTRUMENT USED:  Olympus videopanendoscope.   INDICATIONS FOR PROCEDURE:  The patient is a 77 year old African American  female with a history of borderline gallbladder ejection fraction on a HIDA  scan, undergoing EGD to rule out peptic ulcer disease before referral to a  surgeon for a possible laparoscopic cholecystectomy.  The patient has had  persistent right upper quadrant discomfort and epigastric pain after meals,  worse with greasy foods.   PREPROCEDURE PREPARATION:  Informed consent was secured from the patient.  The patient was fasted for eight hours prior to the procedure.   PREPROCEDURE PHYSICAL EXAMINATION:  VITAL SIGNS:  Stable.  NECK:  Supple.  CHEST:  Clear to auscultation.  S1 and S2 regular.  ABDOMEN:  Soft with normal bowel sounds.   DESCRIPTION OF PROCEDURE:  The patient was placed in the left lateral  decubitus position and sedated with 30 mg of Demerol and 3 mg of Versed in  slow incremental doses.  Once the patient was adequately sedated and  maintained on low-flow oxygen and continuous cardiac monitoring, the Olympus  videopanendoscope was advanced through the mouthpiece, over the tongue, into  the esophagus under direct vision.  The esophagus was widely patent.  There  was no evidence of ring, stricture, mass, esophagitis, or Barrett's mucosa.  The scope was then advanced into the stomach.  A prominent fold was seen in  the antrum.  One biopsy was done.  There was easy bleeding from the site and  therefore  no further biopsies were taken.  Retroflexion did reveal a small  hiatal hernia.  The proximal sidewall appeared normal.  There was no  obstruction.  The patient tolerated the procedure well without immediate  complications.   IMPRESSION:  1.  Widely patent esophagus.  2.  Prominent antral fold.  One biopsy done.  3.  Small hiatal hernia seen on retroflexion.  4.  Normal proximal small bowel.   RECOMMENDATIONS:  1.  The patient has been advised to try Protonix 40 mg one p.o. daily and to      avoid all nonsteroidals.  2.  Outpatient followup as needed.  3.  A surgical evaluation will be made for a possible laparoscopic      cholecystectomy.      Nelwyn Salisbury, M.D.  Electronically Signed     JNM/MEDQ  D:  09/22/2005  T:  09/23/2005  Job:  [id number]   cc:   Theda Belfast. Baird Cancer, M.D.  Fax: [phone]   Gwenyth Ober, M.D.  [id number], [place]  [place] [address]

## 2011-04-19 ENCOUNTER — Inpatient Hospital Stay (HOSPITAL_COMMUNITY)
Admission: EM | Admit: 2011-04-19 | Discharge: 2011-04-23 | DRG: 392 | Disposition: A | Discharge status: Home / Self Care | Payer: Medicare Other | Attending: Internal Medicine | Admitting: Internal Medicine

## 2011-04-19 ENCOUNTER — Emergency Department (HOSPITAL_COMMUNITY): Payer: Medicare Other

## 2011-04-19 DIAGNOSIS — IMO0001 Reserved for inherently not codable concepts without codable children: Secondary | ICD-10-CM | POA: Diagnosis present

## 2011-04-19 DIAGNOSIS — I498 Other specified cardiac arrhythmias: Secondary | ICD-10-CM | POA: Diagnosis present

## 2011-04-19 DIAGNOSIS — R0789 Other chest pain: Secondary | ICD-10-CM | POA: Diagnosis present

## 2011-04-19 DIAGNOSIS — R7402 Elevation of levels of lactic acid dehydrogenase (LDH): Secondary | ICD-10-CM | POA: Diagnosis present

## 2011-04-19 DIAGNOSIS — I10 Essential (primary) hypertension: Secondary | ICD-10-CM | POA: Diagnosis present

## 2011-04-19 DIAGNOSIS — K589 Irritable bowel syndrome without diarrhea: Secondary | ICD-10-CM | POA: Diagnosis present

## 2011-04-19 DIAGNOSIS — D61818 Other pancytopenia: Secondary | ICD-10-CM | POA: Diagnosis present

## 2011-04-19 DIAGNOSIS — R1011 Right upper quadrant pain: Principal | ICD-10-CM | POA: Diagnosis present

## 2011-04-19 DIAGNOSIS — K5900 Constipation, unspecified: Secondary | ICD-10-CM | POA: Diagnosis present

## 2011-04-19 DIAGNOSIS — R7401 Elevation of levels of liver transaminase levels: Secondary | ICD-10-CM | POA: Diagnosis present

## 2011-04-19 LAB — COMPREHENSIVE METABOLIC PANEL
ALT: 204 U/L — ABNORMAL HIGH (ref 0–35)
Calcium: 9.9 mg/dL (ref 8.4–10.5)
Creatinine, Ser: 0.75 mg/dL (ref 0.50–1.10)
GFR calc Af Amer: >60 mL/min (ref >60)
Glucose, Bld: 227 mg/dL — ABNORMAL HIGH (ref 70–99)
Sodium: 135 mEq/L (ref 135–145)
Total Protein: 8.1 g/dL (ref 6.0–8.3)

## 2011-04-19 LAB — URINALYSIS, ROUTINE W REFLEX MICROSCOPIC
Hgb urine dipstick: NEGATIVE
Nitrite: NEGATIVE
Specific Gravity, Urine: 1.012 (ref 1.005–1.030)
Urobilinogen, UA: 0.2 mg/dL (ref 0.0–1.0)
pH: 7 (ref 5.0–8.0)

## 2011-04-19 LAB — CBC
HCT: 33.4 % — ABNORMAL LOW (ref 36.0–46.0)
MCHC: 32.9 g/dL (ref 30.0–36.0)
RDW: 12.8 % (ref 11.5–15.5)

## 2011-04-19 LAB — TROPONIN I: Troponin I: <0.3 ng/mL (ref <0.30)

## 2011-04-19 LAB — LIPASE, BLOOD: Lipase: 36 U/L (ref 11–59)

## 2011-04-19 LAB — DIFFERENTIAL
Basophils Absolute: 0 10*3/uL (ref 0.0–0.1)
Eosinophils Absolute: 0 10*3/uL (ref 0.0–0.7)
Eosinophils Relative: 1 % (ref 0–5)

## 2011-04-19 LAB — CK TOTAL AND CKMB (NOT AT ARMC)
Relative Index: 1.1 (ref 0.0–2.5)
Total CK: 243 U/L — ABNORMAL HIGH (ref 7–177)

## 2011-04-20 ENCOUNTER — Inpatient Hospital Stay (HOSPITAL_COMMUNITY): Payer: Medicare Other

## 2011-04-20 LAB — COMPREHENSIVE METABOLIC PANEL
Alkaline Phosphatase: 82 U/L (ref 39–117)
BUN: 18 mg/dL (ref 6–23)
Chloride: 102 mEq/L (ref 96–112)
GFR calc Af Amer: >60 mL/min (ref >60)
Glucose, Bld: 130 mg/dL — ABNORMAL HIGH (ref 70–99)
Potassium: 3.8 mEq/L (ref 3.5–5.1)
Total Bilirubin: 0.3 mg/dL (ref 0.3–1.2)

## 2011-04-20 LAB — GLUCOSE, CAPILLARY
Glucose-Capillary: 170 mg/dL — ABNORMAL HIGH (ref 70–99)
Glucose-Capillary: 145 mg/dL — ABNORMAL HIGH (ref 70–99)
Glucose-Capillary: 224 mg/dL — ABNORMAL HIGH (ref 70–99)

## 2011-04-20 LAB — CBC
HCT: 28.4 % — ABNORMAL LOW (ref 36.0–46.0)
MCHC: 32 g/dL (ref 30.0–36.0)
MCV: 93.1 fL (ref 78.0–100.0)
RDW: 12.9 % (ref 11.5–15.5)

## 2011-04-20 LAB — CARDIAC PANEL(CRET KIN+CKTOT+MB+TROPI)
Relative Index: 1.4 (ref 0.0–2.5)
Total CK: 129 U/L (ref 7–177)
Troponin I: <0.3 ng/mL (ref <0.30)
Troponin I: <0.3 ng/mL (ref <0.30)

## 2011-04-20 LAB — DIFFERENTIAL
Eosinophils Relative: 2 % (ref 0–5)
Lymphocytes Relative: 38 % (ref 12–46)
Lymphs Abs: 1 10*3/uL (ref 0.7–4.0)
Monocytes Absolute: 0.2 10*3/uL (ref 0.1–1.0)

## 2011-04-20 LAB — HEMOGLOBIN A1C: Hgb A1c MFr Bld: 8.4 % — ABNORMAL HIGH (ref <5.7)

## 2011-04-21 LAB — GLUCOSE, CAPILLARY
Glucose-Capillary: 213 mg/dL — ABNORMAL HIGH (ref 70–99)
Glucose-Capillary: 238 mg/dL — ABNORMAL HIGH (ref 70–99)
Glucose-Capillary: 281 mg/dL — ABNORMAL HIGH (ref 70–99)

## 2011-04-21 LAB — CBC
HCT: 29.2 % — ABNORMAL LOW (ref 36.0–46.0)
MCHC: 33.2 g/dL (ref 30.0–36.0)
RDW: 12.8 % (ref 11.5–15.5)

## 2011-04-21 LAB — COMPREHENSIVE METABOLIC PANEL
Albumin: 3 g/dL — ABNORMAL LOW (ref 3.5–5.2)
Alkaline Phosphatase: 80 U/L (ref 39–117)
BUN: 16 mg/dL (ref 6–23)
Calcium: 8.9 mg/dL (ref 8.4–10.5)
Creatinine, Ser: 0.78 mg/dL (ref 0.50–1.10)
Potassium: 3.9 mEq/L (ref 3.5–5.1)
Total Protein: 6 g/dL (ref 6.0–8.3)

## 2011-04-21 LAB — IRON AND TIBC: UIBC: 240 ug/dL

## 2011-04-22 ENCOUNTER — Inpatient Hospital Stay (HOSPITAL_COMMUNITY): Payer: Medicare Other

## 2011-04-22 LAB — GLUCOSE, CAPILLARY
Glucose-Capillary: 131 mg/dL — ABNORMAL HIGH (ref 70–99)
Glucose-Capillary: 157 mg/dL — ABNORMAL HIGH (ref 70–99)
Glucose-Capillary: 174 mg/dL — ABNORMAL HIGH (ref 70–99)
Glucose-Capillary: 194 mg/dL — ABNORMAL HIGH (ref 70–99)

## 2011-04-22 LAB — DIFFERENTIAL
Basophils Absolute: 0 10*3/uL (ref 0.0–0.1)
Eosinophils Relative: 4 % (ref 0–5)
Lymphocytes Relative: 42 % (ref 12–46)
Lymphs Abs: 1.3 10*3/uL (ref 0.7–4.0)
Neutro Abs: 1.4 10*3/uL — ABNORMAL LOW (ref 1.7–7.7)

## 2011-04-22 LAB — FERRITIN: Ferritin: 44 ng/mL (ref 10–291)

## 2011-04-22 LAB — CBC
HCT: 29 % — ABNORMAL LOW (ref 36.0–46.0)
Hemoglobin: 9.8 g/dL — ABNORMAL LOW (ref 12.0–15.0)
MCV: 90.3 fL (ref 78.0–100.0)
RDW: 12.7 % (ref 11.5–15.5)
WBC: 3.2 10*3/uL — ABNORMAL LOW (ref 4.0–10.5)

## 2011-04-22 LAB — COMPREHENSIVE METABOLIC PANEL
Alkaline Phosphatase: 75 U/L (ref 39–117)
BUN: 16 mg/dL (ref 6–23)
CO2: 27 mEq/L (ref 19–32)
Chloride: 105 mEq/L (ref 96–112)
Creatinine, Ser: 0.78 mg/dL (ref 0.50–1.10)
GFR calc Af Amer: >60 mL/min (ref >60)
GFR calc non Af Amer: >60 mL/min (ref >60)
Glucose, Bld: 177 mg/dL — ABNORMAL HIGH (ref 70–99)
Potassium: 3.7 mEq/L (ref 3.5–5.1)
Total Bilirubin: 0.3 mg/dL (ref 0.3–1.2)

## 2011-04-23 LAB — GLUCOSE, CAPILLARY

## 2011-05-05 NOTE — Consult Note (Signed)
[name], WIED NO.:  [id number]  MEDICAL RECORD NO.:  [id number]  LOCATION:  45                         FACILITY:  [facility]  PHYSICIAN:  [place] OF BIRTH:  1933-04-18  DATE OF CONSULTATION:  04/20/2011 DATE OF DISCHARGE:                                CONSULTATION   REFERRING PHYSICIAN:  Vernell Leep, MD.  PRIMARY CARE PHYSICIAN:  Robyn N. Baird Cancer, M.D.  REASON FOR CONSULTATION:  Chest discomfort.  HISTORY OF PRESENT ILLNESS:  The patient is a 77 year old woman with diabetes, hypertension, and high cholesterol who presented to hospital with right upper quadrant pain.  She had increased LFTs and ductal dilatation concerning for biliary disease.  She has had an extensive prior cardiac workup.  She had a false-positive Cardiolite stress test with subsequent normal cardiac catheterization.  She has also had an SVT and successfully underwent ablation with Dr. Thompson Grayer.  While in the hospital, she was lying in bed earlier today and had some chest pain that radiated to her jaw, it was relieved with some pain medication that was given and it has not returned.  She had not been having any exertional symptoms.  She had been feeling quite well prior to all of this starting.  The pain was the same character as the abdominal pain that she has been having over the last few days.  No diaphoresis or vomiting associated.  She has not had any syncope.  PAST MEDICAL HISTORY: 1. Diabetes. 2. SVT. 3. Hyperlipidemia. 4. Hypertension. 5. Anemia. 6. Irritable bowel syndrome. 7. Obesity.  PAST SURGICAL HISTORY:  She has had cataract surgery, carpal tunnel surgery, hysterectomy.  ALLERGIES:  PENICILLIN and ASPIRIN.  MEDICATIONS:  Medications while in the hospital: 1. Vitamin D. 2. Lasix 20 mg daily. 3. Insulin. 4. Lisinopril 20 mg daily. 5. Lotemax eyedrops. 6. Magnesium. 7. Ditropan. 8. Protonix 80 mg daily. 9. Potassium 10 mEq  daily. 10.Simvastatin 40 mg q.h.s.  SOCIAL HISTORY:  She does not smoke or drink.  She is widowed.  FAMILY HISTORY:  Father died from prostate cancer.  Mother had an MI at age 31.  REVIEW OF SYSTEMS:  Significant for the abdominal discomfort.  She has had some lower extremity edema.  She has had chest discomfort, jaw pain. No palpitations.  All other systems negative.  PHYSICAL EXAMINATION:  VITAL SIGNS:  Blood pressure 149/67, heart rates ranging from 39 to 70. GENERAL:  In general, she is awake, alert, in no apparent distress. HEAD:  Normocephalic, atraumatic. EYES:  Extraocular is intact. NECK:  No JVD. CARDIOVASCULAR:  Bradycardic.  S1, S2. LUNGS:  Clear to auscultation bilaterally. ABDOMEN:  Nondistended. EXTREMITIES:  Showed trace edema. NEURO:  No focal motor or sensory deficits.  ECG shows sinus bradycardia with left bundle-branch block.  Elevated AST and ALT.  Troponin is negative.  Pro-BNP 371 which is normal. Hemoglobin 11.  Chest x-ray shows cardiomegaly with no active disease.  ASSESSMENT/PLAN:  This is a 77 year old with several risk factors for heart disease.  PLAN: 1. Cardiac:  We will continue to check cardiac enzymes.  She had a     negative catheterization in 2008 and  subsequent echo in 2010 showed     normal left ventricular function.  At this time, I would not pursue     ischemia workup unless her enzymes are positive.  No further     evaluation needed before ERCP. 2. Bradycardia.  She has had very slow heart rates particularly while     she is asleep.  She has not had any symptoms from the bradycardia.     For now, we can decrease the dose of atenolol.  I discussed this     with Dr.     Algis Liming. 3. Blood pressure somewhat high today, it may increase since we have     dropped the dose of atenolol, may have to compensate with increase     in the ACE inhibitor.     Jettie Booze, MD     JSV/MEDQ  D:  04/20/2011  T:  04/20/2011  Job:   [id number]  cc:   Vernell Leep, MD  Theda Belfast. Baird Cancer, M.D. Fax: Daniel Benson Norway, MD Fax: [phone]  Electronically Signed by Larae Grooms MD on 05/05/2011 01:30:26 PM

## 2011-05-16 NOTE — Discharge Summary (Signed)
[name], [name] NO.:  [id number]  MEDICAL RECORD NO.:  [id number]  LOCATION:  [id number]                         FACILITY:  [facility]  PHYSICIAN:  Stephanie Leep, MD     DATE OF BIRTH:  Nov 06, 1932  DATE OF ADMISSION:  04/19/2011 DATE OF DISCHARGE:  04/23/2011                              DISCHARGE SUMMARY   PRIMARY CARE PHYSICIAN:  [name], M.D.  CARDIOLOGIST:  Stephanie Booze, MD.  [name]:  [name].  DISCHARGE DIAGNOSES: 1. Right upper quadrant abdominal pain with transaminitis, unclear     etiology, resolved. 2. Pancytopenia, chronic.  Recommend outpatient Hematology     consultation. 3. Uncontrolled type 2 diabetes mellitus. 4. Hypertension. 5. Sinus bradycardia. 6. History of supraventricular tachycardia, status post ablation. 7. Chest pain, possibly noncardiac.  Myocardial infarction ruled out. 8. Hyperlipidemia. 9. Constipation/irritable bowel syndrome.  DISCHARGE MEDICATIONS: 1. MiraLax 17 g in 8 ounces of liquids p.o. daily p.r.n. for     constipation. 2. Atenolol reduced to 25 mg p.o. daily. 3. Atorvastatin 20 mg p.o. q.h.s. 4. Calcium carbonate/vitamin D 1 tablet p.o. daily. 5. Furosemide 20 mg p.o. daily. 6. Iron 325 mg p.o. daily. 7. Januvia 100 mg p.o. daily. 8. Levemir 12 units subcutaneously q.h.s. 9. Lisinopril 20 mg p.o. daily. 10.Loteprednol  1 drop in right eye 3 times at day. 11.Magnesium oxide 1 tablet p.o. daily. 12.Nepafenac 1 drop in right eye four times daily. 13.Nexium 40 mg p.o. daily. 14.Oxybutynin XL 5 mg p.o. b.i.d. 15.Potassium chloride 10 mEq p.o. daily. 16.Vitamin D3 over-the-counter 1 tablet p.o. daily. 17.Welchol 3.75 g p.o. daily.  PROCEDURES:  Endoscopic ultrasound by Dr. Carol [name] on April 22, 2011. Impression:  Mildly dilated common bile duct.  No evidence of stones. Recommendations:  No further GI workup at this time.  IMAGING: 1. Complete abdominal ultrasound on April 19, 2011:  Impression:  Small     amount of pericholecystic fluid.  The technologist reports a     positive sonographic Murphy sign despite no wall thickening or     stones visualized.  They recommended clinical correlation.     Intrahepatic and extrahepatic biliary ductal dilatation.  No     visible filling defect or pancreatic head mass.  Correlate with     LFTs. 2. Chest x-ray on April 19, 2011.  Cardiomegaly and no active disease.  PERTINENT LABORATORY DATA:  Comprehensive metabolic panel yesterday only significant for glucose 177, ALT of 67 and albumin of 2.9.  CBC; hemoglobin 9.8, hematocrit 29, white blood cell 3.2, platelets 94. Anemia panel significant for ferritin 44, serum iron greater than 20, vitamin B12 785.  Cardiac enzymes were cycled and negative.  TSH was 0.726, hemoglobin A1c 8.4.  Urinalysis was negative for features of urinary tract infection and did not have any protein.  Lipase was normal.  Pro-BNP was 372.  Admission.  LFTs showed AST 356 and ALT 204, otherwise, normal LFTs.  CONSULTATIONS: 1. Gastroenterology, Dr. Carol [name] 2. Cardiology, Dr. Jettie [name].  ACTIVITY:  Increase activity slowly and the patient uses a cane and a walker.  She has assistance from family members at home.  DIET:  Diabetic diet.  COMPLAINTS:  The patient denies any complaints.  She denies abdominal pain.  She has tolerated diet.  REVIEW OF SYSTEMS:  Constipation.  PHYSICAL EXAMINATION:  GENERAL:  The patient is in no obvious distress. Telemetry shows sinus rhythm with heart rates now going up into the 80s and 90s with activity.  Occasional sinus bradycardia in the 50s but this was possibly during sleep but is significantly improved than before. VITAL SIGNS:  Temperature 98.5, pulse 59 per minute, respirations 20 per minute, blood pressure 127/64 mmHg and saturating at 100% on room air. RESPIRATORY SYSTEM:  Clear. CARDIOVASCULAR SYSTEM:  First and second heart sounds  heard, regular. ABDOMEN:  Nondistended, nontender, soft, and bowel sounds present. CENTRAL NERVOUS SYSTEM:  The patient is awake, alert, oriented x3, with no focal neurological deficits.  HOSPITAL COURSE:  [name] is a pleasant 77 year old African-American female patient with history of supraventricular tachycardia status post ablation, coronary artery disease, congestive heart failure, type 2 diabetes mellitus, hypertension, hypercholesterolemia, irritable bowel syndrome who is awaiting knee surgery.  In the past, she has been counseled to have cholecystectomy which has been deferred.  She now presented with right upper quadrant abdominal pain, abnormal liver function tests, and ED findings of abnormal ultrasound of the liver.  PROBLEMS: 1. Right upper quadrant pain with transaminitis and abnormal liver     ultrasound.  The patient was admitted to the hospital.  The     differential diagnosis was choledocholithiasis.  Gastroenterology     was consulted.  They performed endoscopic ultrasound and they did     not find any evidence of gallbladder stones or sludge.  They indicated no     further gastroenterology workup.  The patient has been without any     abdominal pain for days.  Her LFTs have nearly normalized.  The     etiology of her symptomatology and presentation is unclear. 2. Pancytopenia.  This seems to be intermittent and chronic looking     back in her lab results from 2010.  We do recommend an outpatient     hematology consultation which may involve a bone marrow biopsy to     rule out conditions such as myelodysplasia. 3. Iron deficiency.  Consider outpatient further evaluation as deemed     necessary.  The patient is on iron supplements which may be     increased as an outpatient. 4. Noncardiac chest pain.  The patient complained of brief episode of     left-sided chest pain on day of admission.  Cardiac enzymes were     cycled and negative.  According to her  cardiologist who was     consulted, she had a recent cardiac cath which was negative.  No     further cardiac workup was recommended. 5. Type 2 diabetes mellitus.  This will need tighter control as an     outpatient. 6. Sinus bradycardia.  The patient has heart rates as low as 40s and     even 30s.  Her atenolol was held for a couple of days and then as     her heart rate started increasing has been resumed at a lower dose.  DISPOSITION:  The patient is stable for discharge to home.  FOLLOWUP RECOMMENDATIONS.: 1. With Dr. [name] in 7 to 10 days from discharge with repeat     CBCs and LFTs.  The patient is to call for an appointment. 2. With Dr. Larae Grooms.  The  patient is to call for an     appointment.  Time taken in coordinating this discharge is 35 minutes.     Stephanie Leep, MD     AH/MEDQ  D:  04/23/2011  T:  04/23/2011  Job:  [id number]  cc:   Theda Belfast. Baird [name], M.D. Fax: [phone]  Electronically Signed by Stephanie Leep MD on 05/16/2011 10:17:40 AM

## 2011-05-17 NOTE — H&P (Signed)
[name], [name] NO.:  [id number]  MEDICAL RECORD NO.:  [id number]  LOCATION:  WLED                         FACILITY:  [facility]  PHYSICIAN:  Orvan Falconer, MD           DATE OF BIRTH:  [birth year]  DATE OF ADMISSION:  04/19/2011 DATE OF DISCHARGE:                             HISTORY & PHYSICAL   PRIMARY CARE PHYSICIAN:  Robyn N. Baird Cancer, M.D.  CARDIOLOGY:  Jettie Booze, MD.  ORTHOPEDICS:  [facility].  ADVANCE DIRECTIVE:  Full code.  REASON FOR ADMISSION:  Right upper quadrant pain, nausea, vomiting; likely having choledocholithiasis.  HISTORY OF PRESENT ILLNESS:  This is a 77 year old female with history of arrhythmia status post prior ablation, coronary artery disease, history of congestive heart failure, diabetes, hypertension, hypercholesterolemia, irritable bowel syndrome, presents to the emergency room with 2 days history of right upper quadrant pain radiating to her back and left shoulder.  She stated that she had episodes like this prior and was thinking there was some constipation but this time it stayed.  She denied fever, chills, nausea or vomiting, black or bloody stool.  She has no chest pain or shortness of breath. Evaluation in the emergency room included an abdominal ultrasound which showed intra and extrahepatic duct dilatation with positive Murphy's sign but no gallstone or gallbladder wall thickening.  There was no visible mass in the pancreatic area.  She further has a normal creatinine of 0.75, a lipase of 36.  Her urinalysis is negative and blood glucose is 227.  Her chest x-ray showed cardiomegaly, but no evidence of congestive heart failure or infiltrate.  She does have slight elevation of her BNP at 371.  She has a normal white count of 4700 and her EKG shows sinus rhythm with a left bundle branch block.  Hospitalist was asked to admit her because of biliary obstruction.  PAST MEDICAL HISTORY:  Arrhythmia, status  post ablation; hypercholesterolemia; diabetes; history of anemia; hypertension; irritable bowel syndrome.  PAST SURGICAL HISTORY:  Includes hysterectomy and carpal tunnel release.  ALLERGY:  ASPIRIN and PENICILLIN.  CURRENT MEDICATIONS:  Include Welchol, atenolol, felodipine, lisinopril, Januvia, potassium supplement, Nexium, Lasix, magnesium oxide, glimepiride, eyedrops, oxybutynin, Lipitor and insulin.  FAMILY HISTORY:  Noncontributory.  SOCIAL HISTORY:  She is a widow.  She has 2 children and she lives alone at [facility].  She denied tobacco, alcohol or drug use.  PHYSICAL EXAMINATION:  VITAL SIGNS:  Blood pressure 150/70, pulse is 62, respiratory rate of 18, temperature 98.4. GENERAL:  Exam shows she is alert and oriented and she is in no apparent distress.  Sclerae nonicteric.  Throat is clear. NECK:  Supple.  No lymphadenopathy, thyromegaly.  No stridor. CARDIAC EXAM:  Revealed S1, S2; regular.  I did not hear any murmur, rub or gallop. LUNGS:  Clear.  No wheezes or rales or any evidence of consolidation. ABDOMINAL EXAM:  Soft, nondistended, minimally tender in the right upper quadrant area.  No rebound.  Bowel sounds present.  No evidence of ascites. EXTREMITIES:  Shows trace edema.  No calf tenderness. SKIN:  Warm and dry. NEUROLOGICAL EXAM:  Unremarkable. PSYCHIATRIC EXAM:  Unremarkable as well.  OBJECTIVE FINDINGS:  As above.  IMPRESSION:  This is a 77 year old with multiple medical problems including diabetes, hypertension, history of congestive heart failure, hypercholesterolemia, presented with biliary obstruction.  She likely has choledocholithiasis.  There is no evidence of infection.  We will admit her and place her n.p.o.  We will give her some pain medication. She likely will need an ERCP with possible stone removal and our emergency room physician Dr. Kae Heller had consulted Dr. Benson Norway of the GI service.  I will hold her diabetic medications and place her on  insulin sliding scale of the sensitive range.  She is a full code, will be admitted to [facility].  Because of her history of congestive heart failure, we will continue her Lasix.  She should not get any intravenous fluid.     Orvan Falconer, MD     PL/MEDQ  D:  04/19/2011  T:  04/19/2011  Job:  [id number]  cc:   Theda Belfast. Baird Cancer, M.D. Fax: [place], MD Fax: [phone]  Electronically Signed by Orvan Falconer  on 05/17/2011 08:09:01 PM

## 2011-05-25 ENCOUNTER — Other Ambulatory Visit: Payer: Self-pay | Admitting: Hematology and Oncology

## 2011-05-25 ENCOUNTER — Encounter (HOSPITAL_BASED_OUTPATIENT_CLINIC_OR_DEPARTMENT_OTHER): Payer: Medicare Other | Admitting: Hematology and Oncology

## 2011-05-25 DIAGNOSIS — D696 Thrombocytopenia, unspecified: Secondary | ICD-10-CM

## 2011-05-25 DIAGNOSIS — D649 Anemia, unspecified: Secondary | ICD-10-CM

## 2011-05-25 DIAGNOSIS — D473 Essential (hemorrhagic) thrombocythemia: Secondary | ICD-10-CM

## 2011-05-25 LAB — CBC & DIFF AND RETIC
BASO%: 0.8 % (ref 0.0–2.0)
EOS%: 7.1 % — ABNORMAL HIGH (ref 0.0–7.0)
HCT: 33.3 % — ABNORMAL LOW (ref 34.8–46.6)
Immature Retic Fract: 8 % (ref 0.00–10.70)
MCH: 29.8 pg (ref 25.1–34.0)
MCHC: 32.7 g/dL (ref 31.5–36.0)
NEUT%: 48.4 % (ref 38.4–76.8)
RBC: 3.66 10*6/uL — ABNORMAL LOW (ref 3.70–5.45)
Retic %: 0.86 % (ref 0.50–1.50)
WBC: 3.8 10*3/uL — ABNORMAL LOW (ref 3.9–10.3)
lymph#: 1.3 10*3/uL (ref 0.9–3.3)

## 2011-05-25 LAB — MORPHOLOGY: PLT EST: DECREASED

## 2011-05-26 NOTE — Consult Note (Signed)
[name], [name]                  ACCOUNT NO.:  [id number]  MEDICAL RECORD NO.:  [id number]  LOCATION:  48                         FACILITY:  [facility]  PHYSICIAN:  Stephanie Emerald. Benson Norway, MD    DATE OF BIRTH:  12/03/1932  DATE OF CONSULTATION: DATE OF DISCHARGE:  04/20/2011                                CONSULTATION   PRIMARY CARE PROVIDER:  Theda [name]. [name], M.D.  CARDIOLOGIST:  Stephanie Booze, MD  REASON FOR CONSULTATION:  Abnormal ultrasound and epigastric and right upper quadrant pain.  HISTORY OF PRESENT ILLNESS:  This is a 77 year old female with past medical history of cardiac arrhythmia status post ablation, hyperlipidemia, diabetes, anemia, hypertension, irritable bowel syndrome who presented to the emergency room with complaints of right upper quadrant pain.  The patient states that she has had a pain in this area before but this is different and had radiated.  It appeared to be lower down in the radiation to the back.  In the past, she was counseled to have her cholecystectomy but she had multiple other medical issues that took precedence namely her heart and then subsequently she had eye surgery.  As a result, she started to have symptoms yesterday and she presented to the emergency room for further evaluation and treatment. An ultrasound was performed and did reveal that she had extrahepatic biliary ductal dilation at 11 mm and some mild intrahepatic biliary ductal dilation.  No evidence of any white count or fever.  Her transaminases were elevated in 300 range and as a result GI consultation was requested for further evaluation and treatment.  PAST MEDICAL AND SURGICAL HISTORY:  As stated above.  FAMILY HISTORY:  Noncontributory.  SOCIAL HISTORY:  Negative for alcohol, tobacco or illicit drug use.  ALLERGIES:  ASPIRIN and PENICILLIN.  MEDICATIONS: 1. Lasix 20 mg p.o. daily. 2. Sliding-scale insulin. 3. Lisinopril 20 mg p.o. daily. 4. Lotemax 1 drop in the  eye t.i.d. 5. Oxybutynin 5 mg p.o. b.i.d. 6. Protonix 80 mg one p.o. daily. 7. Zocor 40 mg p.o. q.h.s. 8. Tylenol 650 mg p.o. q.4 h p.r.n. 9. Dilaudid 0.5 to 1 mg IV q.4 h p.r.n. 10.Zofran 4 mg IV q.6 h p.r.n. 11.Ambien 5 mg p.o. q.h.s. p.r.n.  PHYSICAL EXAMINATION:  VITAL SIGNS:  Blood pressure is 149/67, heart rate is 43, respirations 17, temperature is 98. GENERAL:  The patient is in no acute distress, alert and oriented. HEENT:  Normocephalic, atraumatic.  Extraocular muscles intact. NECK:  Supple.  No lymphadenopathy. LUNGS:  Clear to auscultation bilaterally. CARDIOVASCULAR:  Regular rate and rhythm. ABDOMEN:  Flat, soft, nontender, nondistended.  Positive bowel sounds. EXTREMITIES:  No clubbing, cyanosis or edema.  LABORATORY VALUES:  White blood cell count is 2.5, hemoglobin 9.1, MCV is 93.1, platelets at 89.  Sodium 135, potassium 3.9, chloride 97, CO2 of 27, glucose 227.  BUN 21, creatinine 0.7, alk phos 102, AST 356, ALT is 204.  IMPRESSION: 1. Abnormal ultrasound. 2. Abnormal liver enzymes. 3. Right upper quadrant pain.  At this time the patient is stable but she does complain of having some left-sided chest pain with radiation to the neck and apparently cardiology will come  by to evaluate the patient.  Further evaluation with an endoscopic ultrasound/ERCP is required.  I have reviewed the prior ultrasounds in the past, namely in 2009 and also she had an ultrasound in 2006 and at that time there was no evidence of any gallstones or biliary ductal dilation.  In fact, the common bile duct was noted to be at 4 to 4.7 mm during those examinations, currently is at 11 mm.  Because of the chest pain and also scheduling issues, she will undergo the EUS/ERCP this coming Friday and further recommendations will be made pending the findings.     Stephanie Emerald Benson Norway, MD     PDH/MEDQ  D:  04/20/2011  T:  04/20/2011  Job:  [id number]  cc:   [name]. [name], M.D. Fax:  [place], MD Fax: [phone]  Electronically Signed by Stephanie Ada MD on 05/26/2011 11:20:04 AM

## 2011-05-27 LAB — COMPREHENSIVE METABOLIC PANEL
ALT: 15 U/L (ref 0–35)
AST: 17 U/L (ref 0–37)
Albumin: 4.4 g/dL (ref 3.5–5.2)
Alkaline Phosphatase: 63 U/L (ref 39–117)
BUN: 23 mg/dL (ref 6–23)
Potassium: 4.5 mEq/L (ref 3.5–5.3)
Sodium: 142 mEq/L (ref 135–145)
Total Protein: 8 g/dL (ref 6.0–8.3)

## 2011-05-27 LAB — PROTEIN ELECTROPHORESIS, SERUM, WITH REFLEX
Alpha-1-Globulin: 5.3 % — ABNORMAL HIGH (ref 2.9–4.9)
Alpha-2-Globulin: 11.6 % (ref 7.1–11.8)
Gamma Globulin: 17.8 % (ref 11.1–18.8)

## 2011-05-27 LAB — IRON AND TIBC
%SAT: 18 % — ABNORMAL LOW (ref 20–55)
Iron: 63 ug/dL (ref 42–145)

## 2011-05-27 LAB — FERRITIN: Ferritin: 48 ng/mL (ref 10–291)

## 2011-05-27 LAB — FOLATE RBC: RBC Folate: 858 ng/mL (ref >=366)

## 2011-06-01 ENCOUNTER — Other Ambulatory Visit: Payer: Self-pay | Admitting: Hematology and Oncology

## 2011-06-01 ENCOUNTER — Encounter (HOSPITAL_BASED_OUTPATIENT_CLINIC_OR_DEPARTMENT_OTHER): Payer: Medicare Other | Admitting: Hematology and Oncology

## 2011-06-01 DIAGNOSIS — D696 Thrombocytopenia, unspecified: Secondary | ICD-10-CM

## 2011-06-01 DIAGNOSIS — D473 Essential (hemorrhagic) thrombocythemia: Secondary | ICD-10-CM

## 2011-06-01 DIAGNOSIS — D649 Anemia, unspecified: Secondary | ICD-10-CM

## 2011-06-01 LAB — CBC WITH DIFFERENTIAL/PLATELET
BASO%: 0.4 % (ref 0.0–2.0)
EOS%: 3.7 % (ref 0.0–7.0)
HCT: 32.4 % — ABNORMAL LOW (ref 34.8–46.6)
LYMPH%: 34.4 % (ref 14.0–49.7)
MCH: 29.6 pg (ref 25.1–34.0)
MCHC: 31.8 g/dL (ref 31.5–36.0)
MONO%: 9 % (ref 0.0–14.0)
NEUT%: 52.5 % (ref 38.4–76.8)
Platelets: 122 10*3/uL — ABNORMAL LOW (ref 145–400)
lymph#: 1.7 10*3/uL (ref 0.9–3.3)

## 2011-06-01 LAB — FECAL OCCULT BLOOD, GUAIAC: Occult Blood: NEGATIVE

## 2011-06-07 ENCOUNTER — Encounter (HOSPITAL_BASED_OUTPATIENT_CLINIC_OR_DEPARTMENT_OTHER): Payer: Medicare Other | Admitting: Hematology and Oncology

## 2011-06-07 DIAGNOSIS — D649 Anemia, unspecified: Secondary | ICD-10-CM

## 2011-06-07 DIAGNOSIS — D473 Essential (hemorrhagic) thrombocythemia: Secondary | ICD-10-CM

## 2011-08-05 ENCOUNTER — Other Ambulatory Visit: Payer: Self-pay | Admitting: Internal Medicine

## 2011-08-05 DIAGNOSIS — Z1231 Encounter for screening mammogram for malignant neoplasm of breast: Secondary | ICD-10-CM

## 2011-08-29 ENCOUNTER — Telehealth: Payer: Self-pay | Admitting: Hematology and Oncology

## 2011-08-29 NOTE — Telephone Encounter (Signed)
Lvm advising Nov appts have been cancelled and moved to 11/22/11 lab @10 .45am and 11/24/11 md visit @ 11am.

## 2011-09-05 ENCOUNTER — Other Ambulatory Visit: Payer: Medicare Other | Admitting: Lab

## 2011-09-07 ENCOUNTER — Ambulatory Visit: Payer: Medicare Other | Admitting: Physician Assistant

## 2011-09-14 ENCOUNTER — Ambulatory Visit
Admission: RE | Admit: 2011-09-14 | Discharge: 2011-09-14 | Disposition: A | Discharge status: Home / Self Care | Payer: Medicare Other | Source: Ambulatory Visit | Attending: Internal Medicine | Admitting: Internal Medicine

## 2011-09-14 DIAGNOSIS — Z1231 Encounter for screening mammogram for malignant neoplasm of breast: Secondary | ICD-10-CM

## 2011-10-19 ENCOUNTER — Other Ambulatory Visit: Payer: Self-pay | Admitting: Internal Medicine

## 2011-10-19 MED ORDER — ATENOLOL 50 MG PO TABS: 50.0000 mg | ORAL_TABLET | Freq: Every day | ORAL | Status: DC

## 2011-11-21 ENCOUNTER — Encounter: Payer: Self-pay | Admitting: Hematology and Oncology

## 2011-11-21 DIAGNOSIS — D696 Thrombocytopenia, unspecified: Secondary | ICD-10-CM

## 2011-11-21 HISTORY — DX: Thrombocytopenia, unspecified: D69.6

## 2011-11-22 ENCOUNTER — Other Ambulatory Visit (HOSPITAL_BASED_OUTPATIENT_CLINIC_OR_DEPARTMENT_OTHER): Payer: Medicare Other | Admitting: Lab

## 2011-11-22 DIAGNOSIS — C221 Intrahepatic bile duct carcinoma: Secondary | ICD-10-CM

## 2011-11-22 DIAGNOSIS — D696 Thrombocytopenia, unspecified: Secondary | ICD-10-CM

## 2011-11-22 LAB — CBC WITH DIFFERENTIAL/PLATELET
EOS%: 2.6 % (ref 0.0–7.0)
Eosinophils Absolute: 0.1 10*3/uL (ref 0.0–0.5)
MCH: 30.5 pg (ref 25.1–34.0)
MCV: 92.8 fL (ref 79.5–101.0)
MONO%: 7.4 % (ref 0.0–14.0)
NEUT#: 2.5 10*3/uL (ref 1.5–6.5)
RBC: 3.61 10*6/uL — ABNORMAL LOW (ref 3.70–5.45)
RDW: 12.8 % (ref 11.2–14.5)

## 2011-11-22 LAB — IRON AND TIBC
%SAT: 20 % (ref 20–55)
Iron: 67 ug/dL (ref 42–145)
TIBC: 337 ug/dL (ref 250–470)

## 2011-11-22 LAB — FERRITIN: Ferritin: 119 ng/mL (ref 10–291)

## 2011-11-22 LAB — BASIC METABOLIC PANEL
Calcium: 9.2 mg/dL (ref 8.4–10.5)
Sodium: 139 mEq/L (ref 135–145)

## 2011-11-24 ENCOUNTER — Telehealth: Payer: Self-pay | Admitting: Nurse Practitioner

## 2011-11-24 ENCOUNTER — Ambulatory Visit: Payer: Medicare Other | Admitting: Hematology and Oncology

## 2011-11-24 NOTE — Telephone Encounter (Signed)
Called and left 2/27 appointment information on patient's voicemail.  Also called patient's contact- [name] Small and left appointment information on voicemail as well.

## 2011-11-30 ENCOUNTER — Telehealth: Payer: Self-pay | Admitting: *Deleted

## 2011-11-30 NOTE — Telephone Encounter (Signed)
Spoke with pt and gave pt date and time for next f/u with md on 12/21/11.

## 2011-12-20 ENCOUNTER — Telehealth: Payer: Self-pay | Admitting: Nurse Practitioner

## 2011-12-20 NOTE — Telephone Encounter (Signed)
Pt called to cancel appointment for tomorrow.  States she is sick.  Being treated by her primary MD.

## 2011-12-21 ENCOUNTER — Ambulatory Visit: Payer: Medicare Other | Admitting: Hematology and Oncology

## 2012-01-12 ENCOUNTER — Ambulatory Visit
Admission: RE | Admit: 2012-01-12 | Discharge: 2012-01-12 | Disposition: A | Discharge status: Home / Self Care | Payer: Medicare Other | Source: Ambulatory Visit | Attending: Internal Medicine | Admitting: Internal Medicine

## 2012-01-12 ENCOUNTER — Other Ambulatory Visit: Payer: Self-pay | Admitting: Internal Medicine

## 2012-01-12 DIAGNOSIS — I10 Essential (primary) hypertension: Secondary | ICD-10-CM

## 2012-03-28 ENCOUNTER — Other Ambulatory Visit: Payer: Self-pay | Admitting: Physical Medicine and Rehabilitation

## 2012-03-28 ENCOUNTER — Ambulatory Visit
Admission: RE | Admit: 2012-03-28 | Discharge: 2012-03-28 | Disposition: A | Discharge status: Home / Self Care | Payer: Medicare Other | Source: Ambulatory Visit | Attending: Physical Medicine and Rehabilitation | Admitting: Physical Medicine and Rehabilitation

## 2012-03-28 DIAGNOSIS — M79604 Pain in right leg: Secondary | ICD-10-CM

## 2012-03-28 DIAGNOSIS — R609 Edema, unspecified: Secondary | ICD-10-CM

## 2012-03-28 DIAGNOSIS — R52 Pain, unspecified: Secondary | ICD-10-CM

## 2012-03-28 DIAGNOSIS — M79605 Pain in left leg: Secondary | ICD-10-CM

## 2012-04-05 ENCOUNTER — Ambulatory Visit
Admission: RE | Admit: 2012-04-05 | Discharge: 2012-04-05 | Disposition: A | Discharge status: Home / Self Care | Payer: Medicare Other | Source: Ambulatory Visit | Attending: Physical Medicine and Rehabilitation | Admitting: Physical Medicine and Rehabilitation

## 2012-04-05 DIAGNOSIS — R52 Pain, unspecified: Secondary | ICD-10-CM

## 2012-04-05 DIAGNOSIS — R609 Edema, unspecified: Secondary | ICD-10-CM

## 2012-05-03 ENCOUNTER — Telehealth: Payer: Self-pay | Admitting: Hematology and Oncology

## 2012-05-03 NOTE — Telephone Encounter (Signed)
l/m on cell to call for new pt appt   aom

## 2012-05-04 ENCOUNTER — Telehealth: Payer: Self-pay | Admitting: Hematology and Oncology

## 2012-05-04 NOTE — Telephone Encounter (Signed)
s/w pt and she does not feel that she needs to see us,she is seeing dr Baird Cancer on Monday 7/15 and will talk with her and call us back.    aom

## 2012-05-10 ENCOUNTER — Telehealth: Payer: Self-pay | Admitting: Hematology and Oncology

## 2012-05-10 NOTE — Telephone Encounter (Signed)
l/m for her to call to see if she still wanted to make appt or not  aom

## 2012-05-11 ENCOUNTER — Telehealth: Payer: Self-pay | Admitting: Hematology and Oncology

## 2012-05-11 NOTE — Telephone Encounter (Signed)
s/w [name] and she stated that she was taking her to [facility] for a second op for the coumadin clinic and she had a ret. appt on 7.26    aom

## 2012-10-10 ENCOUNTER — Inpatient Hospital Stay (HOSPITAL_COMMUNITY)
Admission: EM | Admit: 2012-10-10 | Discharge: 2012-10-16 | DRG: 251 | Disposition: A | Discharge status: Skilled Nursing Facility | Payer: Medicare Other | Attending: Interventional Cardiology | Admitting: Interventional Cardiology

## 2012-10-10 ENCOUNTER — Emergency Department (HOSPITAL_COMMUNITY): Payer: Medicare Other

## 2012-10-10 ENCOUNTER — Encounter (HOSPITAL_COMMUNITY): Payer: Self-pay | Admitting: Emergency Medicine

## 2012-10-10 DIAGNOSIS — I498 Other specified cardiac arrhythmias: Principal | ICD-10-CM | POA: Diagnosis present

## 2012-10-10 DIAGNOSIS — I5032 Chronic diastolic (congestive) heart failure: Secondary | ICD-10-CM | POA: Diagnosis present

## 2012-10-10 DIAGNOSIS — N179 Acute kidney failure, unspecified: Secondary | ICD-10-CM | POA: Diagnosis not present

## 2012-10-10 DIAGNOSIS — Z86718 Personal history of other venous thrombosis and embolism: Secondary | ICD-10-CM

## 2012-10-10 DIAGNOSIS — E119 Type 2 diabetes mellitus without complications: Secondary | ICD-10-CM | POA: Diagnosis present

## 2012-10-10 DIAGNOSIS — R42 Dizziness and giddiness: Secondary | ICD-10-CM | POA: Insufficient documentation

## 2012-10-10 DIAGNOSIS — I10 Essential (primary) hypertension: Secondary | ICD-10-CM

## 2012-10-10 DIAGNOSIS — I447 Left bundle-branch block, unspecified: Secondary | ICD-10-CM

## 2012-10-10 DIAGNOSIS — I4892 Unspecified atrial flutter: Secondary | ICD-10-CM

## 2012-10-10 DIAGNOSIS — I440 Atrioventricular block, first degree: Secondary | ICD-10-CM

## 2012-10-10 DIAGNOSIS — N289 Disorder of kidney and ureter, unspecified: Secondary | ICD-10-CM | POA: Diagnosis not present

## 2012-10-10 DIAGNOSIS — I5031 Acute diastolic (congestive) heart failure: Secondary | ICD-10-CM

## 2012-10-10 DIAGNOSIS — I471 Supraventricular tachycardia: Secondary | ICD-10-CM

## 2012-10-10 HISTORY — DX: Dizziness and giddiness: R42

## 2012-10-10 HISTORY — DX: Acute embolism and thrombosis of unspecified deep veins of unspecified lower extremity: I82.409

## 2012-10-10 LAB — CBC WITH DIFFERENTIAL/PLATELET
Basophils Absolute: 0 10*3/uL (ref 0.0–0.1)
Eosinophils Relative: 1 % (ref 0–5)
HCT: 35.4 % — ABNORMAL LOW (ref 36.0–46.0)
Hemoglobin: 11.6 g/dL — ABNORMAL LOW (ref 12.0–15.0)
Lymphocytes Relative: 41 % (ref 12–46)
MCV: 89.6 fL (ref 78.0–100.0)
Monocytes Absolute: 0.4 10*3/uL (ref 0.1–1.0)
Monocytes Relative: 7 % (ref 3–12)
RDW: 13.7 % (ref 11.5–15.5)
WBC: 5 10*3/uL (ref 4.0–10.5)

## 2012-10-10 LAB — PROTIME-INR
INR: 2.06 — ABNORMAL HIGH (ref 0.00–1.49)
Prothrombin Time: 22.4 seconds — ABNORMAL HIGH (ref 11.6–15.2)

## 2012-10-10 LAB — MRSA PCR SCREENING: MRSA by PCR: NEGATIVE

## 2012-10-10 LAB — BASIC METABOLIC PANEL
BUN: 27 mg/dL — ABNORMAL HIGH (ref 6–23)
CO2: 25 mEq/L (ref 19–32)
Calcium: 9.8 mg/dL (ref 8.4–10.5)
Creatinine, Ser: 0.94 mg/dL (ref 0.50–1.10)
Glucose, Bld: 322 mg/dL — ABNORMAL HIGH (ref 70–99)

## 2012-10-10 LAB — CK TOTAL AND CKMB (NOT AT ARMC)
CK, MB: 2.8 ng/mL (ref 0.3–4.0)
Relative Index: 0.8 (ref 0.0–2.5)

## 2012-10-10 MED ORDER — OXYBUTYNIN CHLORIDE 5 MG PO TABS
5.0000 mg | ORAL_TABLET | Freq: Two times a day (BID) | ORAL | Status: DC
  Administered 2012-10-10 – 2012-10-16 (×12): 5 mg via ORAL
  Filled 2012-10-10 (×13): qty 1

## 2012-10-10 MED ORDER — VITAMIN D (ERGOCALCIFEROL) 1.25 MG (50000 UNIT) PO CAPS: 50000.0000 [IU] | ORAL_CAPSULE | ORAL | Status: DC

## 2012-10-10 MED ORDER — GUAIFENESIN 100 MG/5ML PO SOLN
10.0000 mL | ORAL | Status: DC | PRN
  Filled 2012-10-10: qty 10

## 2012-10-10 MED ORDER — PANTOPRAZOLE SODIUM 40 MG PO TBEC: 40.0000 mg | DELAYED_RELEASE_TABLET | Freq: Every day | ORAL | Status: DC

## 2012-10-10 MED ORDER — POTASSIUM CHLORIDE ER 10 MEQ PO TBCR
20.0000 meq | EXTENDED_RELEASE_TABLET | Freq: Every day | ORAL | Status: DC
  Administered 2012-10-12: 09:00:00 20 meq via ORAL
  Filled 2012-10-10 (×3): qty 2

## 2012-10-10 MED ORDER — PANTOPRAZOLE SODIUM 40 MG PO TBEC
80.0000 mg | DELAYED_RELEASE_TABLET | Freq: Every day | ORAL | Status: DC
  Administered 2012-10-11 – 2012-10-16 (×6): 80 mg via ORAL
  Filled 2012-10-10 (×2): qty 2
  Filled 2012-10-10: qty 1
  Filled 2012-10-10: qty 2
  Filled 2012-10-10 (×2): qty 1
  Filled 2012-10-10: qty 2

## 2012-10-10 MED ORDER — FERROUS GLUCONATE 324 (38 FE) MG PO TABS
324.0000 mg | ORAL_TABLET | Freq: Every day | ORAL | Status: DC
  Administered 2012-10-11 – 2012-10-16 (×6): 324 mg via ORAL
  Filled 2012-10-10 (×7): qty 1

## 2012-10-10 MED ORDER — ONDANSETRON HCL 4 MG/2ML IJ SOLN: 4.0000 mg | Freq: Four times a day (QID) | INTRAMUSCULAR | Status: DC | PRN

## 2012-10-10 MED ORDER — VITAMIN D (ERGOCALCIFEROL) 1.25 MG (50000 UNIT) PO CAPS
50000.0000 [IU] | ORAL_CAPSULE | ORAL | Status: DC
  Administered 2012-10-12 – 2012-10-16 (×2): 50000 [IU] via ORAL
  Filled 2012-10-10 (×2): qty 1

## 2012-10-10 MED ORDER — INSULIN ASPART 100 UNIT/ML ~~LOC~~ SOLN
0.0000 [IU] | Freq: Three times a day (TID) | SUBCUTANEOUS | Status: DC
  Administered 2012-10-11 – 2012-10-12 (×2): 8 [IU] via SUBCUTANEOUS
  Administered 2012-10-12: 5 [IU] via SUBCUTANEOUS
  Administered 2012-10-12: 15 [IU] via SUBCUTANEOUS
  Administered 2012-10-13: 5 [IU] via SUBCUTANEOUS
  Administered 2012-10-13: 15 [IU] via SUBCUTANEOUS
  Administered 2012-10-13 – 2012-10-14 (×2): 5 [IU] via SUBCUTANEOUS
  Administered 2012-10-14: 11 [IU] via SUBCUTANEOUS
  Administered 2012-10-14 – 2012-10-15 (×2): 15 [IU] via SUBCUTANEOUS
  Administered 2012-10-15: 5 [IU] via SUBCUTANEOUS
  Administered 2012-10-15: 8 [IU] via SUBCUTANEOUS
  Administered 2012-10-16: 3 [IU] via SUBCUTANEOUS
  Administered 2012-10-16: 8 [IU] via SUBCUTANEOUS

## 2012-10-10 MED ORDER — GUAIFENESIN 100 MG/5ML PO SYRP
200.0000 mg | ORAL_SOLUTION | ORAL | Status: DC | PRN
  Filled 2012-10-10: qty 118

## 2012-10-10 MED ORDER — FUROSEMIDE 10 MG/ML IJ SOLN
40.0000 mg | Freq: Two times a day (BID) | INTRAMUSCULAR | Status: DC
  Administered 2012-10-10 – 2012-10-13 (×5): 40 mg via INTRAVENOUS
  Filled 2012-10-10 (×7): qty 4

## 2012-10-10 MED ORDER — ATORVASTATIN CALCIUM 20 MG PO TABS
20.0000 mg | ORAL_TABLET | Freq: Every day | ORAL | Status: DC
  Administered 2012-10-10 – 2012-10-15 (×6): 20 mg via ORAL
  Filled 2012-10-10 (×7): qty 1

## 2012-10-10 MED ORDER — ALPRAZOLAM 0.25 MG PO TABS
0.2500 mg | ORAL_TABLET | Freq: Two times a day (BID) | ORAL | Status: DC | PRN
  Administered 2012-10-14 – 2012-10-15 (×2): 0.25 mg via ORAL
  Filled 2012-10-10 (×2): qty 1

## 2012-10-10 MED ORDER — COLESEVELAM HCL 3.75 G PO PACK
3.7500 g | PACK | Freq: Every day | ORAL | Status: DC
  Administered 2012-10-12 – 2012-10-13 (×2): 3.75 g via ORAL
  Administered 2012-10-14: 12:00:00 via ORAL
  Administered 2012-10-15 – 2012-10-16 (×2): 3.75 g via ORAL
  Filled 2012-10-10 (×6): qty 1

## 2012-10-10 MED ORDER — ATENOLOL 50 MG PO TABS
50.0000 mg | ORAL_TABLET | Freq: Every day | ORAL | Status: DC
  Administered 2012-10-12 – 2012-10-13 (×2): 50 mg via ORAL
  Filled 2012-10-10 (×5): qty 1

## 2012-10-10 MED ORDER — GUAIFENESIN 100 MG/5ML PO SOLN
10.0000 mL | ORAL | Status: DC | PRN
  Administered 2012-10-10: 200 mg via ORAL
  Filled 2012-10-10: qty 10

## 2012-10-10 MED ORDER — INSULIN GLARGINE 100 UNIT/ML ~~LOC~~ SOLN
25.0000 [IU] | Freq: Every day | SUBCUTANEOUS | Status: DC
  Administered 2012-10-11: 12 [IU] via SUBCUTANEOUS
  Administered 2012-10-12: 25 [IU] via SUBCUTANEOUS
  Administered 2012-10-13: 11:00:00 via SUBCUTANEOUS
  Administered 2012-10-14 – 2012-10-15 (×2): 25 [IU] via SUBCUTANEOUS

## 2012-10-10 MED ORDER — DILTIAZEM HCL 100 MG IV SOLR
5.0000 mg/h | INTRAVENOUS | Status: DC
  Filled 2012-10-10: qty 100

## 2012-10-10 MED ORDER — FELODIPINE ER 10 MG PO TB24
10.0000 mg | ORAL_TABLET | Freq: Every day | ORAL | Status: DC
  Administered 2012-10-12 – 2012-10-16 (×4): 10 mg via ORAL
  Filled 2012-10-10 (×6): qty 1

## 2012-10-10 MED ORDER — ACETAMINOPHEN 325 MG PO TABS: 650.0000 mg | ORAL_TABLET | ORAL | Status: DC | PRN

## 2012-10-10 MED ORDER — NITROGLYCERIN 0.4 MG SL SUBL: 0.4000 mg | SUBLINGUAL_TABLET | SUBLINGUAL | Status: DC | PRN

## 2012-10-10 NOTE — ED Notes (Signed)
Per EMS pt came from routine cardiology appointment and was sent to [facility] for SVT rate of 160. Pt c/o SOB with exertion and CP. Pt has hx of heart ablation.

## 2012-10-10 NOTE — H&P (Signed)
Admit date: 10/10/2012 Referring Physician Dr. Bryon Lions Primary Cardiologist Irish Lack Chief complaint/reason for admission:near syncope, SHOB, weight gain  HPI: 78 y/o with known diastolic dysfunction who has had worsening SHOB and edema over the past few weeks.  She has had episodic fluttering and near syncope episodes as well.  She was seen by her orthopedist and PCP and then sent to our office for fluid management.    She was having some chest discomfort in the office.  SHe has been therapeutic on her coumadin.  While examining her, she had a rapid heart beat that was different than her initial ECG.  We repeated the ECG which showed a rapid heart rate, up to 146.  SHe has a known LBBB.  The presyncope correlated perfectly to the tachycardia.  No syncope.  Atypical chest pain noted as well.    PMH:    Past Medical History  Diagnosis Date  . Thrombocytopenia 11/21/2011  . DVT (deep venous thrombosis)     PSH:   History reviewed. No pertinent past surgical history.  ALLERGIES:   Aspirin and Penicillins  Prior to Admit Meds:   (Not in a hospital admission) Family HX:   No family history on file. Social HX:    History   Social History  . Marital Status: Widowed    Spouse Name: N/A    Number of Children: N/A  . Years of Education: N/A   Occupational History  . Not on file.   Social History Main Topics  . Smoking status: Never Smoker   . Smokeless tobacco: Never Used  . Alcohol Use: No  . Drug Use: No  . Sexually Active:    Other Topics Concern  . Not on file   Social History Narrative  . No narrative on file     ROS:  All 11 ROS were addressed and are negative except what is stated in the HPI  PHYSICAL EXAM Filed Vitals:   10/10/12 1730  BP: 127/76  Pulse: 61  Temp:   Resp: 15   General: Well developed, well nourished, in no acute distress Head: Eyes PERRLA, No xanthomas.   Normal cephalic and atramatic  Lungs:   Clear bilaterally to auscultation and  percussion. Heart: Tachycardic S1 S2 Pulses are 2+ & equal.            No carotid bruit.  Abdomen: Bowel sounds are positive, abdomen soft and non-tender  Msk:  . Normal strength and tone for age. Extremities:   No edema.  DP +1 Neuro: Alert and oriented X 3. Psych:  normal affect, responds appropriately, anxious   Labs:   Lab Results  Component Value Date   WBC 5.0 10/10/2012   HGB 11.6* 10/10/2012   HCT 35.4* 10/10/2012   MCV 89.6 10/10/2012   PLT 149* 10/10/2012    Lab 10/10/12 1618  NA 136  K 4.4  CL 98  CO2 25  BUN 27*  CREATININE 0.94  CALCIUM 9.8  PROT --  BILITOT --  ALKPHOS --  ALT --  AST --  GLUCOSE 322*   Lab Results  Component Value Date   CKTOTAL 129 04/20/2011   CKMB 1.8 04/20/2011   TROPONINI <0.30 10/10/2012   No results found for this basename: PTT   Lab Results  Component Value Date   INR 1.0 ratio 03/24/2009   INR 1.0 12/10/2008     Lab Results  Component Value Date   CHOL  Value: 202  ATP III CLASSIFICATION:  <200     mg/dL   Desirable  200-239  mg/dL   Borderline High  >=240    mg/dL   High       * 12/11/2008   Lab Results  Component Value Date   HDL 78 12/11/2008   Lab Results  Component Value Date   LDLCALC  Value: 111        Total Cholesterol/HDL:CHD Risk Coronary Heart Disease Risk Table                     Men   Women  1/2 Average Risk   3.4   3.3  Average Risk       5.0   4.4  2 X Average Risk   9.6   7.1  3 X Average Risk  23.4   11.0        Use the calculated Patient Ratio above and the CHD Risk Table to determine the patient's CHD Risk.        ATP III CLASSIFICATION (LDL):  <100     mg/dL   Optimal  100-129  mg/dL   Near or Above                    Optimal  130-159  mg/dL   Borderline  160-189  mg/dL   High  >190     mg/dL   Very High* 12/11/2008   Lab Results  Component Value Date   TRIG 64 12/11/2008   Lab Results  Component Value Date   CHOLHDL 2.6 12/11/2008   No results found for this basename: LDLDIRECT       Radiology:  @RISRSLT24 @  EKG:  Wide complex tachycardia, likely SVT with LBBB  ASSESSMENT: Possible atrial tachycardia with LBBB vs. Atrial flutter.  She has had ablation in the past.    PLAN:  Start IV cardizem.  R/O for MI.  No h/o CAD.  Get EP consult.  Coumadin for DVT.  Doubt PE given therapeutic INR recently.  Pharmacy consult.  Hold ARB as IV diltiazem is being started.    SSI for diabetes.    Diureses as weight is up.  Likely related to diastolic dysfunction.  Will put echo from the office on the chart.    Jettie Booze., MD  10/10/2012  6:07 PM

## 2012-10-10 NOTE — Progress Notes (Signed)
Pt arrived on unit at approximately 1907 alert and oriented.

## 2012-10-10 NOTE — Progress Notes (Signed)
ANTICOAGULATION CONSULT NOTE - Initial Consult  Pharmacy Consult for Coumadin Indication:  History of  DVT  Allergies  Allergen Reactions  . Aspirin   . Penicillins     Patient Measurements: Height: 5\' 5"  (165.1 cm) Weight: 193 lb 12.6 oz (87.9 kg) IBW/kg (Calculated) : 57    Vital Signs: Temp: 98.2 F (36.8 C) (12/18 1916) Temp src: Oral (12/18 1916) BP: 138/54 mmHg (12/18 1945) Pulse Rate: 48  (12/18 1945)  Labs:  [facility] 10/10/12 1618  HGB 11.6*  HCT 35.4*  PLT 149*  APTT --  LABPROT 21.7*  INR 1.98*  HEPARINUNFRC --  CREATININE 0.94  CKTOTAL --  CKMB --  TROPONINI <0.30    Estimated Creatinine Clearance: 54.9 ml/min (by C-G formula based on Cr of 0.94).   Medical History: Past Medical History  Diagnosis Date  . Thrombocytopenia 11/21/2011  . DVT (deep venous thrombosis)   . Dizziness and giddiness   . Acute diastolic heart failure     Medications:  Prescriptions prior to admission  Medication Sig Dispense Refill  . atorvastatin (LIPITOR) 20 MG tablet Take 20 mg by mouth daily.      . calcium carbonate (OS-CAL) 600 MG TABS Take 600 mg by mouth 2 (two) times daily with a meal.      . Colesevelam HCl 3.75 G PACK Take 3.75 g by mouth daily.      [name] esomeprazole (NEXIUM) 40 MG capsule Take 40 mg by mouth daily before breakfast.      . felodipine (PLENDIL) 10 MG 24 hr tablet Take 10 mg by mouth daily.      . ferrous gluconate (FERGON) 324 MG tablet Take 324 mg by mouth daily with breakfast.      . insulin aspart (NOVOLOG) 100 UNIT/ML injection Inject 1-9 Units into the skin 3 (three) times daily before meals. Sliding scale      . insulin glargine (LANTUS) 100 UNIT/ML injection Inject 25 Units into the skin daily.      [name] lisinopril (PRINIVIL,ZESTRIL) 20 MG tablet Take 20 mg by mouth daily.      . magnesium oxide (MAG-OX) 400 MG tablet Take 400 mg by mouth daily.      [name] oxybutynin (DITROPAN) 5 MG tablet Take 5 mg by mouth 2 (two) times daily.      .  potassium chloride (K-DUR) 10 MEQ tablet Take 20 mEq by mouth daily.      . Vitamin D, Ergocalciferol, (DRISDOL) 50000 UNITS CAPS Take 50,000 Units by mouth 2 (two) times a week.      . warfarin (COUMADIN) 10 MG tablet Take 10 mg by mouth daily. Takes 5 mg along with a 10 mg tablet in Monday, Wednesday and Friday, then takes 2.5 mg along with a 10 mg all other days      . warfarin (COUMADIN) 2.5 MG tablet Take 2.5-5 mg by mouth daily. Takes 5 mg along with a 10 mg tablet in Monday, Wednesday and Friday, then takes 2.5 mg along with a 10 mg all other days      . atenolol (TENORMIN) 50 MG tablet Take 1 tablet (50 mg total) by mouth daily.  30 tablet  1    Assessment: 78 yo [name] on chronic coumadin for history of DVT.  Admitted today with possible atrial tachycardia with LBBB vs Atrial flutter and atypical chest pain. Cardizem IV infusion ordered but now on hold due to NSR.   Admit INR is 1.98 which is below therapeutic INR goal  of 2-3. Her home dose of warfarin is 12.5 mg q TTSS and  15 mg qMWF. No bleeding reported. History of thrombocytopenia noted.   Dr. Irish Lack indicated on pharmacy consult order to hold coumadin tonight if troponin abnormal.  Troponin = <0.3 Patient has already taken her coumadin today 10/10/12.   Goal of Therapy:  INR 2-3 Monitor platelets by anticoagulation protocol: Yes   Plan:  She has already taken her coumadin today prior to admission.  Follow up daily Protime/INR.    Nicole Cella, RPh Clinical Pharmacist Pager: [phone] 10/10/2012,8:09 PM

## 2012-10-10 NOTE — ED Provider Notes (Signed)
History     CSN: [id number]  Arrival date & time 10/10/12  87   First MD Initiated Contact with Patient 10/10/12 1553      Chief Complaint  Patient presents with  . Chest Pain  . Shortness of Breath    (Consider location/radiation/quality/duration/timing/severity/associated sxs/prior treatment) Patient is a 78 y.o. female presenting with shortness of breath. The history is provided by the patient.  Shortness of Breath  Associated symptoms include shortness of breath.  She was sent here from her cardiologist's office where she was noted to be having problems with rapid heart rate. She relates that she's been having dyspnea on exertion for several months which got significantly worse 2 days ago. She cannot walk to her mailbox without having to stop to catch her breath. She also has chest pain with this exertion. There is occasional mild nausea but no diaphoresis. She's also noted increasing swelling in her legs. She talked with her PCP about whether she could increase her dose of furosemide, but the PCP deferred that decision to her cardiologist. She says she has gained about 30 pounds.  Past Medical History  Diagnosis Date  . Thrombocytopenia 11/21/2011    No past surgical history on file.  No family history on file.  History  Substance Use Topics  . Smoking status: Not on file  . Smokeless tobacco: Not on file  . Alcohol Use: Not on file    OB History    Grav Para Term Preterm Abortions TAB SAB Ect Mult Living                  Review of Systems  Respiratory: Positive for shortness of breath.   All other systems reviewed and are negative.    Allergies  Aspirin and Penicillins  Home Medications   Current Outpatient Rx  Name  Route  Sig  Dispense  Refill  . ATENOLOL 50 MG PO TABS   Oral   Take 1 tablet (50 mg total) by mouth daily.   30 tablet   1     There were no vitals taken for this visit.  Physical Exam  Nursing note and vitals reviewed. 78 year  old female, resting comfortably and in no acute distress. Vital signs are significant for mild tachypnea with respiratory rate of 22, and hypertension with blood pressure 152/69. Oxygen saturation is 100%, which is normal. Head is normocephalic and atraumatic. PERRLA, EOMI. Oropharynx is clear. Neck is nontender and supple without adenopathy or JVD. Hyperdynamic carotid pulsations are noted.  Back is nontender and there is no CVA tenderness. Lungs are clear without rales, wheezes, or rhonchi. Chest is nontender. Heart has regular rate and rhythm without murmur. Abdomen is soft, flat, nontender without masses or hepatosplenomegaly and peristalsis is normoactive. Extremities have 2+edema, no cyanosis.  Skin is warm and dry without rash. Neurologic: Mental status is normal, cranial nerves are intact, there are no motor or sensory deficits.   ED Course  Procedures (including critical care time)  Results for orders placed during the hospital encounter of 10/10/12  CBC WITH DIFFERENTIAL      Component Value Range   WBC 5.0  4.0 - 10.5 K/uL   RBC 3.95  3.87 - 5.11 MIL/uL   Hemoglobin 11.6 (*) 12.0 - 15.0 g/dL   HCT 35.4 (*) 36.0 - 46.0 %   MCV 89.6  78.0 - 100.0 fL   MCH 29.4  26.0 - 34.0 pg   MCHC 32.8  30.0 - 36.0 g/dL  RDW 13.7  11.5 - 15.5 %   Platelets 149 (*) 150 - 400 K/uL   Neutrophils Relative 51  43 - 77 %   Neutro Abs 2.6  1.7 - 7.7 K/uL   Lymphocytes Relative 41  12 - 46 %   Lymphs Abs 2.0  0.7 - 4.0 K/uL   Monocytes Relative 7  3 - 12 %   Monocytes Absolute 0.4  0.1 - 1.0 K/uL   Eosinophils Relative 1  0 - 5 %   Eosinophils Absolute 0.1  0.0 - 0.7 K/uL   Basophils Relative 0  0 - 1 %   Basophils Absolute 0.0  0.0 - 0.1 K/uL  BASIC METABOLIC PANEL      Component Value Range   Sodium 136  135 - 145 mEq/L   Potassium 4.4  3.5 - 5.1 mEq/L   Chloride 98  96 - 112 mEq/L   CO2 25  19 - 32 mEq/L   Glucose, Bld 322 (*) 70 - 99 mg/dL   BUN 27 (*) 6 - 23 mg/dL   Creatinine,  Ser 0.94  0.50 - 1.10 mg/dL   Calcium 9.8  8.4 - 10.5 mg/dL   GFR calc non Af Amer 57 (*) >90 mL/min   GFR calc Af Amer 66 (*) >90 mL/min  PRO B NATRIURETIC PEPTIDE      Component Value Range   Pro B Natriuretic peptide (BNP) 237.9  0 - 450 pg/mL  TROPONIN I      Component Value Range   Troponin I <0.30  <0.30 ng/mL   Dg Chest Portable 1 View  10/10/2012  *RADIOLOGY REPORT*  Clinical Data: Chest pain, shortness of breath, on medication for hypertension  PORTABLE CHEST - 1 VIEW  Comparison: Chest x-ray of 01/12/2012  Findings: No active infiltrate or effusion is seen.  Very minimal patchy opacity is noted in the left upper lung field probably representing atelectasis but if symptoms persist or worsen follow- up chest x-ray may be warranted.  The heart is mildly enlarged. There is mild thoracic curvature convex to the right with degenerative change.  IMPRESSION:  1.  Minimal opacity in the left upper lung field possibly due to atelectasis or scarring.  Consider follow-up if clinically warranted. 2. Mild cardiomegaly   Original Report Authenticated By: Ivar Drape, M.D.       Date: 10/10/2012  1607  Rate: 74  Rhythm: normal sinus rhythm  QRS Axis: left  Intervals: normal  ST/T Wave abnormalities: normal  Conduction Disutrbances:left bundle branch block  Narrative Interpretation: Left bundle-branch block, left axis deviation. When compared with ECG of 04/19/2011, no significant changes are seen.  Old EKG Reviewed: unchanged   Date: 10/10/2012  1610  Rate: 137  Rhythm: supraventricular tachycardia (SVT)  QRS Axis: left  Intervals: normal  ST/T Wave abnormalities: normal  Conduction Disutrbances:left bundle branch block  Narrative Interpretation: Supraventricular tachycardia with left bundle branch block and left axis deviation. When compared with ECG of earlier today, supraventricular tachycardias replaced normal sinus rhythm.  Old EKG Reviewed: changes noted    1. Atrial flutter        MDM  Congestive heart failure exacerbation with exertional angina. Report of tachyarrhythmia. Her heart monitor showed sinus rhythm with occasional PACs. She then suddenly went to a regular rhythm at 133 beats per minute which slowed down promptly with carotid sinus massage. I discussed her case with her cardiologist prior to her arriving in the ED. Screening labs will be obtained and she  will be admitted for management of her arrhythmia.        Delora Fuel, MD [id number] [id number]

## 2012-10-11 ENCOUNTER — Encounter (HOSPITAL_COMMUNITY): Payer: Self-pay | Admitting: Family Medicine

## 2012-10-11 ENCOUNTER — Encounter (HOSPITAL_COMMUNITY)
Admission: EM | Disposition: A | Discharge status: Skilled Nursing Facility | Payer: Self-pay | Source: Home / Self Care | Attending: Interventional Cardiology

## 2012-10-11 DIAGNOSIS — I4892 Unspecified atrial flutter: Secondary | ICD-10-CM

## 2012-10-11 DIAGNOSIS — I471 Supraventricular tachycardia: Secondary | ICD-10-CM

## 2012-10-11 HISTORY — PX: SUPRAVENTRICULAR TACHYCARDIA ABLATION: SHX5492

## 2012-10-11 LAB — POCT ACTIVATED CLOTTING TIME
Activated Clotting Time: 198 seconds
Activated Clotting Time: 181 seconds

## 2012-10-11 LAB — GLUCOSE, CAPILLARY
Glucose-Capillary: 275 mg/dL — ABNORMAL HIGH (ref 70–99)
Glucose-Capillary: 76 mg/dL (ref 70–99)
Glucose-Capillary: 138 mg/dL — ABNORMAL HIGH (ref 70–99)

## 2012-10-11 LAB — PROTIME-INR
INR: 1.98 — ABNORMAL HIGH (ref 0.00–1.49)
Prothrombin Time: 21.7 seconds — ABNORMAL HIGH (ref 11.6–15.2)

## 2012-10-11 LAB — BASIC METABOLIC PANEL
Chloride: 102 mEq/L (ref 96–112)
GFR calc Af Amer: 74 mL/min — ABNORMAL LOW (ref >90)
GFR calc non Af Amer: 64 mL/min — ABNORMAL LOW (ref >90)
Potassium: 4 mEq/L (ref 3.5–5.1)
Sodium: 141 mEq/L (ref 135–145)

## 2012-10-11 LAB — CK TOTAL AND CKMB (NOT AT ARMC): Relative Index: 0.8 (ref 0.0–2.5)

## 2012-10-11 LAB — TROPONIN I: Troponin I: <0.3 ng/mL (ref <0.30)

## 2012-10-11 LAB — HEMOGLOBIN A1C: Mean Plasma Glucose: 214 mg/dL — ABNORMAL HIGH (ref <117)

## 2012-10-11 SURGERY — SUPRAVENTRICULAR TACHYCARDIA ABLATION
Anesthesia: LOCAL

## 2012-10-11 MED ORDER — MIDAZOLAM HCL 5 MG/5ML IJ SOLN
INTRAMUSCULAR | Status: AC
  Filled 2012-10-11: qty 5

## 2012-10-11 MED ORDER — BUPIVACAINE HCL (PF) 0.25 % IJ SOLN
INTRAMUSCULAR | Status: AC
  Filled 2012-10-11: qty 60

## 2012-10-11 MED ORDER — DEXTROSE 5 % IV SOLN
INTRAVENOUS | Status: AC
  Filled 2012-10-11: qty 250

## 2012-10-11 MED ORDER — WARFARIN SODIUM 2.5 MG PO TABS
12.5000 mg | ORAL_TABLET | ORAL | Status: DC
  Administered 2012-10-11: 12.5 mg via ORAL
  Filled 2012-10-11: qty 1

## 2012-10-11 MED ORDER — SODIUM CHLORIDE 0.9 % IJ SOLN
3.0000 mL | Freq: Two times a day (BID) | INTRAMUSCULAR | Status: DC
  Administered 2012-10-12 – 2012-10-16 (×8): 3 mL via INTRAVENOUS

## 2012-10-11 MED ORDER — MORPHINE SULFATE 2 MG/ML IJ SOLN
2.0000 mg | INTRAMUSCULAR | Status: DC | PRN
  Administered 2012-10-11 (×2): 2 mg via INTRAVENOUS
  Filled 2012-10-11 (×2): qty 1

## 2012-10-11 MED ORDER — SODIUM CHLORIDE 0.9 % IJ SOLN: 3.0000 mL | INTRAMUSCULAR | Status: DC | PRN

## 2012-10-11 MED ORDER — WARFARIN - PHARMACIST DOSING INPATIENT
Freq: Every day | Status: DC
  Administered 2012-10-14: 18:00:00

## 2012-10-11 MED ORDER — FENTANYL CITRATE 0.05 MG/ML IJ SOLN
INTRAMUSCULAR | Status: AC
  Filled 2012-10-11: qty 2

## 2012-10-11 MED ORDER — HYDROCODONE-ACETAMINOPHEN 5-325 MG PO TABS
1.0000 | ORAL_TABLET | Freq: Four times a day (QID) | ORAL | Status: DC | PRN
  Administered 2012-10-11: 1 via ORAL
  Administered 2012-10-12: 2 via ORAL
  Administered 2012-10-13 – 2012-10-15 (×2): 1 via ORAL
  Filled 2012-10-11: qty 2
  Filled 2012-10-11 (×3): qty 1

## 2012-10-11 MED ORDER — HEPARIN SODIUM (PORCINE) 1000 UNIT/ML IJ SOLN
INTRAMUSCULAR | Status: AC
  Filled 2012-10-11: qty 1

## 2012-10-11 MED ORDER — POTASSIUM CHLORIDE CRYS ER 20 MEQ PO TBCR
40.0000 meq | EXTENDED_RELEASE_TABLET | Freq: Once | ORAL | Status: DC
  Filled 2012-10-11: qty 2
  Filled 2012-10-11: qty 1

## 2012-10-11 MED ORDER — WARFARIN SODIUM 7.5 MG PO TABS
15.0000 mg | ORAL_TABLET | ORAL | Status: DC
  Filled 2012-10-11: qty 2

## 2012-10-11 MED ORDER — SODIUM CHLORIDE 0.9 % IV SOLN: 250.0000 mL | INTRAVENOUS | Status: DC | PRN

## 2012-10-11 MED ORDER — ONDANSETRON HCL 4 MG/2ML IJ SOLN: 4.0000 mg | Freq: Four times a day (QID) | INTRAMUSCULAR | Status: DC | PRN

## 2012-10-11 MED ORDER — ACETAMINOPHEN 325 MG PO TABS: 650.0000 mg | ORAL_TABLET | ORAL | Status: DC | PRN

## 2012-10-11 MED ORDER — HYDROXYUREA 500 MG PO CAPS
ORAL_CAPSULE | ORAL | Status: AC
  Filled 2012-10-11: qty 1

## 2012-10-11 NOTE — Progress Notes (Signed)
Act 181. 51fr right IJ sheath aspirated and removed. Manual pressure applied for 15 minutes. BP 132/67 hr 77. No s+s of hematoma, tegaderm dressing applied. Assisted nurse with arterial sheath removal.

## 2012-10-11 NOTE — Progress Notes (Signed)
Rt femoral arterial sheath removed, followed by 3 venous sheaths.  Total 35 minutes pressure held. Pt tolerated well but c/o severe rt knee throbbing.  Medicated with 2 mg IV morphine before sheath removal, followed by another 2 mg by Rosanne Gutting RN while this RN held pressure to rt groin.  RIJ and Rt groin sites level 0.  Continues to c/o rt knee pain.  States hx DVT.  Warm pack applied to top of rt knee and pt voices this brings comfort.  Will continue to monitor closely.  Granddaughter at bedside.  Pt up 30 degrees eating soup.  Call light in reach.

## 2012-10-11 NOTE — Progress Notes (Signed)
BP 91/46 with a recheck of 79/49. HR 55-60's. Pt easily aroused and resting comfortably at this time.  MD notified. No new orders at this time. Will continue to monitor.  M.Forest Gleason, RN

## 2012-10-11 NOTE — Progress Notes (Addendum)
Pt transported to IR for ablation procedure.  Pt was alert and oriented without questions or concerns prior to transport.  Vitals are documented prior to transport.  Pt belongings (dentures) were given to daughter during the procedure.  Family was informed that post procedure, pt would be sent to [id number].  Nurse requested family call prior to coming back to hospital to find out actual room assignment (not currently assigned as of yet).

## 2012-10-11 NOTE — H&P (Signed)
Patient ID: [name]  MRN: [id number], DOB/AGE: [birth year] 78 y.o.  Admit date: 10/10/2012  Date of Consult: 10-11-2012  Primary Physician: Maximino Greenland, MD  Primary Cardiologist: Casandra Doffing, MD  Electrophysiologist: Thompson Grayer, MD  Reason for Consultation: SVT  HPI:  [name] is a 78 year old female admitted with recurrent symptomatic SVT. EP has been asked to evaluate for treatment options.  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 There were no inducible arrhythmias following ablation. She was seen in the office in February of 2012 for recurrent palpitations. Event monitor demonstrated a WCT which was likely SVT. Ablation was discussed with the patient at that time, however, she wished to pursue medical therapy. Her Atenolol was increased to 50mg  daily. Following that visit, she has done well from a palpitation standpoint.  However, in the past 2 weeks, she has noticed recurrent episodes of palpitations that are associated with shortness of breath. These usually self-terminate. On the day of admission, she had another episode and became presyncopal while being seen at [facility] which prompted evaluation at [facility]. EKG demonstrated SVT at a rate of 147. It is unclear if this terminated spontaneously or if she was given Diltiazem.  She denies chest pain or frank syncope.  Past medical history is also significant for DVT (on Coumadin), thrombocytopenia, diabetes.  ROS is negative except as outlined above.  Past Medical History   Diagnosis  Date   .  Thrombocytopenia  11/21/2011   .  DVT (deep venous thrombosis)    .  Dizziness and giddiness    .  Acute diastolic heart failure     Surgical History: History reviewed. No pertinent past surgical history.  Prescriptions prior to admission   Medication  Sig  Dispense  Refill   .  atorvastatin  (LIPITOR) 20 MG tablet  Take 20 mg by mouth daily.     .  calcium carbonate (OS-CAL) 600 MG TABS  Take 600 mg by mouth 2 (two) times daily with a meal.     .  Colesevelam HCl 3.75 G PACK  Take 3.75 g by mouth daily.     [name]  esomeprazole (NEXIUM) 40 MG capsule  Take 40 mg by mouth daily before breakfast.     .  felodipine (PLENDIL) 10 MG 24 hr tablet  Take 10 mg by mouth daily.     .  ferrous gluconate (FERGON) 324 MG tablet  Take 324 mg by mouth daily with breakfast.     .  insulin aspart (NOVOLOG) 100 UNIT/ML injection  Inject 1-9 Units into the skin 3 (three) times daily before meals. Sliding scale     .  insulin glargine (LANTUS) 100 UNIT/ML injection  Inject 25 Units into the skin daily.     [name]  lisinopril (PRINIVIL,ZESTRIL) 20 MG tablet  Take 20 mg by mouth daily.     .  magnesium oxide (MAG-OX) 400 MG tablet  Take 400 mg by mouth daily.     [name]  oxybutynin (DITROPAN) 5 MG tablet  Take 5 mg by mouth 2 (two) times daily.     .  potassium chloride (K-DUR) 10 MEQ tablet  Take 20 mEq by mouth daily.     .  Vitamin D, Ergocalciferol, (DRISDOL) 50000 UNITS CAPS  Take 50,000 Units by mouth 2 (two) times a week.     [name]  warfarin (COUMADIN) 10 MG tablet  Take 10 mg by mouth daily. Takes 5 mg along with a 10 mg tablet in Monday, Wednesday and Friday, then takes 2.5 mg along with a 10 mg all other days     .  warfarin (COUMADIN) 2.5 MG tablet  Take 2.5-5 mg by mouth daily. Takes 5 mg along with a 10 mg tablet in Monday, Wednesday and Friday, then takes 2.5 mg along with a 10 mg all other days     .  atenolol (TENORMIN) 50 MG tablet  Take 1 tablet (50 mg total) by mouth daily.  30 tablet  1   Inpatient Medications:  .  atenolol  50 mg  Oral  Daily   .  atorvastatin  20 mg  Oral  q1800   .  Colesevelam HCl  3.75 g  Oral  Daily   .  felodipine  10 mg  Oral  Daily   .  ferrous gluconate  324 mg  Oral  Q breakfast   .  furosemide  40 mg  Intravenous  BID   .  insulin aspart  0-15 Units  Subcutaneous  TID WC    .  insulin glargine  25 Units  Subcutaneous  Daily   .  oxybutynin  5 mg  Oral  BID   .  pantoprazole  80 mg  Oral  Daily   .  potassium chloride  20 mEq  Oral  Daily   .  potassium chloride  40 mEq  Oral  Once   .  Vitamin D (Ergocalciferol)  50,000 Units  Oral  Custom   .  warfarin  15 mg  Oral  Q M,W,F-1800    And   .  warfarin  12.5 mg  Oral  Q T,Th,S,Su-1800   .  Warfarin - Pharmacist Dosing Inpatient   Does not apply  q1800    Allergies:  Allergies   Allergen  Reactions   .  Aspirin    .  Penicillins     History    Social History   .  Marital Status:  Widowed     Spouse Name:  N/A     Number of Children:  N/A   .  Years of Education:  N/A    Occupational History   .  Not on file.    Social History Main Topics   .  Smoking status:  Never Smoker   .  Smokeless tobacco:  Never Used   .  Alcohol Use:  No   .  Drug Use:  No   .  Sexually Active:     Other Topics  Concern   .  Not on file    Social History Narrative   .  No narrative on file    History reviewed. No pertinent family history.  Physical Exam  Well appearing elderly woman, NAD  HEENT: Unremarkable  Neck: 7 cm JVD, no thyromegally  Lungs: Clear with no wheezes  HEART: Regular rate rhythm, no murmurs, no rubs, no clicks  Abd: Flat, positive bowel sounds, no organomegally, no rebound, no guarding  Ext: 2 plus pulses, no edema, no cyanosis, no clubbing  Skin: No rashes no nodules  Neuro: CN II through XII intact, motor grossly intact  Labs:  Lab Results   Component  Value  Date    WBC  5.0  10/10/2012    HGB  11.6*  10/10/2012    HCT  35.4*  10/10/2012  MCV  89.6  10/10/2012    PLT  149*  10/10/2012    Lab  10/11/12 0839   NA  141   K  4.0   CL  102   CO2  29   BUN  23   CREATININE  0.86   CALCIUM  9.1   PROT  --   BILITOT  --   ALKPHOS  --   ALT  --   AST  --   GLUCOSE  267*    Lab Results   Component  Value  Date    CKTOTAL  263*  10/11/2012    CKMB  2.0  10/11/2012     TROPONINI  <0.30  10/11/2012    Radiology/Studies: Dg Chest Portable 1 View 10/10/2012 *RADIOLOGY REPORT* Clinical Data: Chest pain, shortness of breath, on medication for hypertension PORTABLE CHEST - 1 VIEW Comparison: Chest x-ray of 01/12/2012 Findings: No active infiltrate or effusion is seen. Very minimal patchy opacity is noted in the left upper lung field probably representing atelectasis but if symptoms persist or worsen follow- up chest x-ray may be warranted. The heart is mildly enlarged. There is mild thoracic curvature convex to the right with degenerative change. IMPRESSION: 1. Minimal opacity in the left upper lung field possibly due to atelectasis or scarring. Consider follow-up if clinically warranted. 2. Mild cardiomegaly Original Report Authenticated By: Ivar Drape, M.D.  TELEMETRY: sinus brady, sinus rhythm  Echo- report requested from [facility]  A/P  1. Recurrent SVT  2. LBBB  3. H/o SVT ablation  Rec: I have discussed the treatment options with the patient and her family and she wishes to proceed with ablation. I have explained to the family that she has significant conduction system disease as well as AVNRT and may end up with PPM. They understand.  Mikle Bosworth.D.

## 2012-10-11 NOTE — Progress Notes (Signed)
SUBJECTIVE:  Feels better this morning.  No further palpitations.  Not receiving diltiazem.  Unclear whether she did receive it at any point.  OBJECTIVE:   Vitals:   Filed Vitals:   10/11/12 0402 10/11/12 0600 10/11/12 0700 10/11/12 0741  BP: 97/47 98/51 118/50 115/46  Pulse: 52 51 50 64  Temp: 97.6 F (36.4 C)   98.2 F (36.8 C)  TempSrc: Axillary   Oral  Resp: 17 18 13 19   Height:      Weight:      SpO2: 99% 99% 99% 100%   I&O's:   Intake/Output Summary (Last 24 hours) at 10/11/12 [id number] Last data filed at 10/11/12 0615  Gross per 24 hour  Intake    120 ml  Output   1375 ml  Net  -1255 ml   TELEMETRY: Reviewed telemetry pt in sinus bradycardia:     PHYSICAL EXAM General: Well developed, well nourished, in no acute distress Head: Normal cephalic and atramatic  Lungs:   No wheezing Heart: bradycardic S1 S2 Pulses are 2+ & equal.            No carotid bruit. No JVD.  No abdominal bruits. No femoral bruits. Abdomen: , abdomen soft and non-tender  Msk: . Normal strength and tone for age. Extremities:   1+ bilateral edema.  DP +1 Neuro: Alert and oriented X 3. Psych:  Normal affect, responds appropriately   LABS: Basic Metabolic Panel:  Basename 10/10/12 1618  NA 136  K 4.4  CL 98  CO2 25  GLUCOSE 322*  BUN 27*  CREATININE 0.94  CALCIUM 9.8  MG --  PHOS --   Liver Function Tests: No results found for this basename: AST:2,ALT:2,ALKPHOS:2,BILITOT:2,PROT:2,ALBUMIN:2 in the last 72 hours No results found for this basename: LIPASE:2,AMYLASE:2 in the last 72 hours CBC:  Basename 10/10/12 1618  WBC 5.0  NEUTROABS 2.6  HGB 11.6*  HCT 35.4*  MCV 89.6  PLT 149*   Cardiac Enzymes:  Basename 10/11/12 0118 10/10/12 2004 10/10/12 1618  CKTOTAL 284* 346* --  CKMB 2.4 2.8 --  CKMBINDEX -- -- --  TROPONINI <0.30 <0.30 <0.30   BNP: No components found with this basename: POCBNP:3 D-Dimer: No results found for this basename: DDIMER:2 in the last 72  hours Hemoglobin A1C:  Basename 10/10/12 2004  HGBA1C 9.1*   Fasting Lipid Panel: No results found for this basename: CHOL,HDL,LDLCALC,TRIG,CHOLHDL,LDLDIRECT in the last 72 hours Thyroid Function Tests:  Basename 10/10/12 2004  TSH 0.843  T4TOTAL --  T3FREE --  THYROIDAB --   Anemia Panel: No results found for this basename: VITAMINB12,FOLATE,FERRITIN,TIBC,IRON,RETICCTPCT in the last 72 hours Coag Panel:   Lab Results  Component Value Date   INR 2.06* 10/10/2012   INR 1.98* 10/10/2012   INR 1.0 ratio 03/24/2009    RADIOLOGY: Dg Chest Portable 1 View  10/10/2012  *RADIOLOGY REPORT*  Clinical Data: Chest pain, shortness of breath, on medication for hypertension  PORTABLE CHEST - 1 VIEW  Comparison: Chest x-ray of 01/12/2012  Findings: No active infiltrate or effusion is seen.  Very minimal patchy opacity is noted in the left upper lung field probably representing atelectasis but if symptoms persist or worsen follow- up chest x-ray may be warranted.  The heart is mildly enlarged. There is mild thoracic curvature convex to the right with degenerative change.  IMPRESSION:  1.  Minimal opacity in the left upper lung field possibly due to atelectasis or scarring.  Consider follow-up if clinically warranted. 2. Mild cardiomegaly  Original Report Authenticated By: Ivar Drape, M.D.       ASSESSMENT:SVT, LBBB  PLAN:  H/o AVNRT with slow pathway modification in 6/10.  Spoke with Dr. Rayann Heman this AM.  EP to see.  ECG from yesterday in the system showing what I believe is SVT with LBBB.  No clear flutter waves.  DVT. INR slightly below 2.0. Pharmacy to dose coumadin.  Ruled out for MI.  NPO for now to see if EP procedure to be done today.    DM.  Poorly controlled.  Continue sliding scale for now.  Fluid overload.  Diuresing as well.    Jettie Booze., MD  10/11/2012  8:28 AM

## 2012-10-11 NOTE — Progress Notes (Signed)
Utilization Review Completed.Donne Anon T12/19/2013

## 2012-10-11 NOTE — Clinical Documentation Improvement (Signed)
CHF DOCUMENTATION CLARIFICATION QUERY  THIS DOCUMENT IS NOT A PERMANENT PART OF THE MEDICAL RECORD   Please update your documentation within the medical record to reflect your response to this query.                                                                                     10/11/12  Dr. Radford Pax and[facility] Associates,  In a better effort to capture your patient's severity of illness, reflect appropriate length of stay and utilization of resources, a review of the patient medical record has revealed the following indicators:  "Diureses as weight is up. Likely related to diastolic dysfunction. Will put echo from the office on the chart" VARANASI,JAYADEEP S., MD  10/10/2012  H&P   "Fluid overload. Diuresing as well." VARANASI,JAYADEEP S., MD  10/11/2012  H&P  Currently receiving Lasix 40 mg IV BID    Based on your clinical judgment, please document in the progress notes and discharge summary if a condition below provides greater specificity regarding the patient's heart function and treatment this admission:   - Acute on Chronic Diastolic Heart Failure   - Other Condition   - Unable to Clinically Determine   In responding to this query please exercise your independent judgment.    The fact that a query is asked, does not imply that any particular answer is desired or expected.   Reviewed: additional documentation in the medical record  Thank You,  Erling Conte  RN BSN CCDS Certified Clinical Documentation Specialist: Cell   [phone]  Health Information Management [place]:  1. If needed, update documentation for the patient's encounter via the notes activity.  2. Access this query again and click edit on the In [employer].  3. After updating, or not, click F2 to complete all highlighted (required) fields concerning your review. Select "additional documentation in the medical record"  OR "no additional documentation provided".  4. Click Sign note button.  5. The deficiency will fall out of your In Basket *Please let us know if you are not able to complete this workflow by phone or e-mail (listed below).

## 2012-10-11 NOTE — Progress Notes (Signed)
ANTICOAGULATION CONSULT NOTE - Follow Up Consult  Pharmacy Consult for Coumadin Indication: history of DVT  Allergies  Allergen Reactions  . Aspirin   . Penicillins     Patient Measurements: Height: 5\' 5"  (165.1 cm) Weight: 193 lb 9 oz (87.8 kg) IBW/kg (Calculated) : 57   Vital Signs: Temp: 98.2 F (36.8 C) (12/19 0741) Temp src: Oral (12/19 0741) BP: 126/52 mmHg (12/19 0900) Pulse Rate: 58  (12/19 0900)  Labs:  Basename 10/11/12 0839 10/11/12 0118 10/10/12 2004 10/10/12 1618  HGB -- -- -- 11.6*  HCT -- -- -- 35.4*  PLT -- -- -- 149*  APTT -- -- 32 --  LABPROT 21.7* -- 22.4* 21.7*  INR 1.98* -- 2.06* 1.98*  HEPARINUNFRC -- -- -- --  CREATININE -- -- -- 0.94  CKTOTAL -- 284* 346* --  CKMB -- 2.4 2.8 --  TROPONINI -- <0.30 <0.30 <0.30    Estimated Creatinine Clearance: 54.8 ml/min (by C-G formula based on Cr of 0.94).   Medications:  Scheduled:    . atenolol  50 mg Oral Daily  . atorvastatin  20 mg Oral q1800  . Colesevelam HCl  3.75 g Oral Daily  . felodipine  10 mg Oral Daily  . ferrous gluconate  324 mg Oral Q breakfast  . furosemide  40 mg Intravenous BID  . insulin aspart  0-15 Units Subcutaneous TID WC  . insulin glargine  25 Units Subcutaneous Daily  . oxybutynin  5 mg Oral BID  . pantoprazole  80 mg Oral Daily  . potassium chloride  20 mEq Oral Daily  . potassium chloride  40 mEq Oral Once  . Vitamin D (Ergocalciferol)  50,000 Units Oral Custom  . [DISCONTINUED] pantoprazole  40 mg Oral Daily  . [DISCONTINUED] Vitamin D (Ergocalciferol)  50,000 Units Oral 2 times weekly    Assessment: 78 year old female admitted with palpitations and chest pain attributed to atrial tachycardia vs. Atrial flutter.  She is on chronic anticoagulation with Coumadin for history of DVT.  Her INR is at the lower end of the therapeutic range.  Goal of Therapy:  INR 2-3   Plan:  Continue with her home Coumadin dose - 12.5mg  Tues/Thurs/Sat/Sun, 15mg  Mon/Wed/Fri. Daily  PT/INR monitoring for now  Legrand Como, Pharm.D., BCPS Clinical Pharmacist  Phone [phone] Pager [phone] 10/11/2012, 9:39 AM

## 2012-10-11 NOTE — Consult Note (Signed)
ELECTROPHYSIOLOGY CONSULT NOTE    Patient ID: [name] MRN: [id number], DOB/AGE: 76-30-1934 76 y.o.  Admit date: 10/10/2012 Date of Consult: 10-11-2012  Primary Physician: Maximino Greenland, MD Primary Cardiologist: Casandra Doffing, MD Electrophysiologist: Thompson Grayer, MD  Reason for Consultation: SVT  HPI:  [name] is a 78 year old female admitted with recurrent symptomatic SVT.  EP has been asked to evaluate for treatment options.  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  There were no inducible arrhythmias following ablation.  She was seen in the office in February of 2012 for recurrent palpitations.  Event monitor demonstrated a WCT which was likely SVT.  Ablation was discussed with the patient at that time, however, she wished to pursue medical therapy.  Her Atenolol was increased to 50mg  daily.  Following that visit, she has done well from a palpitation standpoint.    However, in the past 2 weeks, she has noticed recurrent episodes of palpitations that are associated with shortness of breath.  These usually self-terminate.  On the day of admission, she had another episode and became presyncopal while being seen at [facility] which prompted evaluation at [facility].  EKG demonstrated SVT at a rate of 147.  It is unclear if this terminated spontaneously or if she was given Diltiazem.     She denies chest pain or frank syncope.    Past medical history is also significant for DVT (on Coumadin), thrombocytopenia, diabetes.  ROS is negative except as outlined above.   Past Medical History  Diagnosis Date  . Thrombocytopenia 11/21/2011  . DVT (deep venous thrombosis)   . Dizziness and giddiness   . Acute diastolic heart failure      Surgical History: History reviewed. No pertinent past surgical history.   Prescriptions prior to admission    Medication Sig Dispense Refill  . atorvastatin (LIPITOR) 20 MG tablet Take 20 mg by mouth daily.      . calcium carbonate (OS-CAL) 600 MG TABS Take 600 mg by mouth 2 (two) times daily with a meal.      . Colesevelam HCl 3.75 G PACK Take 3.75 g by mouth daily.      [name] esomeprazole (NEXIUM) 40 MG capsule Take 40 mg by mouth daily before breakfast.      . felodipine (PLENDIL) 10 MG 24 hr tablet Take 10 mg by mouth daily.      . ferrous gluconate (FERGON) 324 MG tablet Take 324 mg by mouth daily with breakfast.      . insulin aspart (NOVOLOG) 100 UNIT/ML injection Inject 1-9 Units into the skin 3 (three) times daily before meals. Sliding scale      . insulin glargine (LANTUS) 100 UNIT/ML injection Inject 25 Units into the skin daily.      [name] lisinopril (PRINIVIL,ZESTRIL) 20 MG tablet Take 20 mg by mouth daily.      . magnesium oxide (MAG-OX) 400 MG tablet Take 400 mg by mouth daily.      [name] oxybutynin (DITROPAN) 5 MG tablet Take 5 mg by mouth 2 (two) times daily.      . potassium chloride (K-DUR) 10 MEQ tablet Take 20 mEq by mouth daily.      . Vitamin D, Ergocalciferol, (DRISDOL) 50000 UNITS CAPS Take 50,000 Units by mouth 2 (two) times a week.      . warfarin (COUMADIN)  10 MG tablet Take 10 mg by mouth daily. Takes 5 mg along with a 10 mg tablet in Monday, Wednesday and Friday, then takes 2.5 mg along with a 10 mg all other days      . warfarin (COUMADIN) 2.5 MG tablet Take 2.5-5 mg by mouth daily. Takes 5 mg along with a 10 mg tablet in Monday, Wednesday and Friday, then takes 2.5 mg along with a 10 mg all other days      . atenolol (TENORMIN) 50 MG tablet Take 1 tablet (50 mg total) by mouth daily.  30 tablet  1    Inpatient Medications:    . atenolol  50 mg Oral Daily  . atorvastatin  20 mg Oral q1800  . Colesevelam HCl  3.75 g Oral Daily  . felodipine  10 mg Oral Daily  . ferrous gluconate  324 mg Oral Q breakfast  . furosemide  40 mg Intravenous BID  . insulin aspart  0-15 Units  Subcutaneous TID WC  . insulin glargine  25 Units Subcutaneous Daily  . oxybutynin  5 mg Oral BID  . pantoprazole  80 mg Oral Daily  . potassium chloride  20 mEq Oral Daily  . potassium chloride  40 mEq Oral Once  . Vitamin D (Ergocalciferol)  50,000 Units Oral Custom  . warfarin  15 mg Oral Q M,W,F-1800   And  . warfarin  12.5 mg Oral Q T,Th,S,Su-1800  . Warfarin - Pharmacist Dosing Inpatient   Does not apply q1800    Allergies:  Allergies  Allergen Reactions  . Aspirin   . Penicillins     History   Social History  . Marital Status: Widowed    Spouse Name: N/A    Number of Children: N/A  . Years of Education: N/A   Occupational History  . Not on file.   Social History Main Topics  . Smoking status: Never Smoker   . Smokeless tobacco: Never Used  . Alcohol Use: No  . Drug Use: No  . Sexually Active:    Other Topics Concern  . Not on file   Social History Narrative  . No narrative on file     History reviewed. No pertinent family history.   Physical Exam  Well appearing elderly woman, NAD HEENT: Unremarkable Neck:  7 cm JVD, no thyromegally Lungs:  Clear with no wheezes HEART:  Regular rate rhythm, no murmurs, no rubs, no clicks Abd:  Flat, positive bowel sounds, no organomegally, no rebound, no guarding Ext:  2 plus pulses, no edema, no cyanosis, no clubbing Skin:  No rashes no nodules Neuro:  CN II through XII intact, motor grossly intact   Labs:   Lab Results  Component Value Date   WBC 5.0 10/10/2012   HGB 11.6* 10/10/2012   HCT 35.4* 10/10/2012   MCV 89.6 10/10/2012   PLT 149* 10/10/2012    Lab 10/11/12 0839  NA 141  K 4.0  CL 102  CO2 29  BUN 23  CREATININE 0.86  CALCIUM 9.1  PROT --  BILITOT --  ALKPHOS --  ALT --  AST --  GLUCOSE 267*   Lab Results  Component Value Date   CKTOTAL 263* 10/11/2012   CKMB 2.0 10/11/2012   TROPONINI <0.30 10/11/2012    Radiology/Studies: Dg Chest Portable 1 View 10/10/2012  *RADIOLOGY  REPORT*  Clinical Data: Chest pain, shortness of breath, on medication for hypertension  PORTABLE CHEST - 1 VIEW  Comparison: Chest x-ray of 01/12/2012  Findings: No active infiltrate or effusion is seen.  Very minimal patchy opacity is noted in the left upper lung field probably representing atelectasis but if symptoms persist or worsen follow- up chest x-ray may be warranted.  The heart is mildly enlarged. There is mild thoracic curvature convex to the right with degenerative change.  IMPRESSION:  1.  Minimal opacity in the left upper lung field possibly due to atelectasis or scarring.  Consider follow-up if clinically warranted. 2. Mild cardiomegaly   Original Report Authenticated By: Ivar Drape, M.D.     TELEMETRY: sinus brady, sinus rhythm  Echo- report requested from [facility]   A/P  1. Recurrent SVT 2. LBBB 3. H/o SVT ablation Rec: I have discussed the treatment options with the patient and her family and she wishes to proceed with ablation. I have explained to the family that she has significant conduction system disease as well as AVNRT and may end up with PPM. They understand.  Mikle Bosworth.D.

## 2012-10-11 NOTE — Op Note (Signed)
EPS/RFA of AVRT performed without immediate complication. [id number].

## 2012-10-11 NOTE — Progress Notes (Signed)
PT HR down to 47 non-sustaining. Now between 50-60's SR. All other VSS. MD notified. No parameters set for RN to call MD at this time. Continue to monitor.   M.Forest Gleason, RN

## 2012-10-12 LAB — BASIC METABOLIC PANEL
BUN: 22 mg/dL (ref 6–23)
CO2: 27 mEq/L (ref 19–32)
Calcium: 9 mg/dL (ref 8.4–10.5)
Chloride: 103 mEq/L (ref 96–112)
Creatinine, Ser: 0.95 mg/dL (ref 0.50–1.10)
Glucose, Bld: 263 mg/dL — ABNORMAL HIGH (ref 70–99)

## 2012-10-12 LAB — URINALYSIS, ROUTINE W REFLEX MICROSCOPIC
Hgb urine dipstick: NEGATIVE
Leukocytes, UA: NEGATIVE
Protein, ur: NEGATIVE mg/dL
Urobilinogen, UA: 0.2 mg/dL (ref 0.0–1.0)

## 2012-10-12 LAB — GLUCOSE, CAPILLARY
Glucose-Capillary: 278 mg/dL — ABNORMAL HIGH (ref 70–99)
Glucose-Capillary: 245 mg/dL — ABNORMAL HIGH (ref 70–99)

## 2012-10-12 MED ORDER — WARFARIN SODIUM 7.5 MG PO TABS
15.0000 mg | ORAL_TABLET | Freq: Once | ORAL | Status: AC
  Administered 2012-10-12: 15 mg via ORAL
  Filled 2012-10-12: qty 2

## 2012-10-12 MED ORDER — OFF THE BEAT BOOK
Freq: Once | Status: AC
  Administered 2012-10-12: 07:00:00
  Filled 2012-10-12: qty 1

## 2012-10-12 MED ORDER — AMIODARONE HCL 200 MG PO TABS
200.0000 mg | ORAL_TABLET | Freq: Two times a day (BID) | ORAL | Status: DC
  Administered 2012-10-12 – 2012-10-14 (×6): 200 mg via ORAL
  Filled 2012-10-12 (×10): qty 1

## 2012-10-12 NOTE — Op Note (Signed)
Stephanie, [name]                  ACCOUNT NO.:  [id number]  MEDICAL RECORD NO.:  [id number]  LOCATION:  [facility]                         FACILITY:  [place]  PHYSICIAN:  Champ Mungo. Lovena Le, MD    DATE OF BIRTH:  05/09/1933  DATE OF PROCEDURE:  10/11/2012 DATE OF DISCHARGE:                              OPERATIVE REPORT   PROCEDURE PERFORMED:  Electrophysiologic study and RF catheter ablation of AV reentrant tachycardia utilizing a right mid paraseptal accessory pathway.  Isoproterenol was used to help initiate the tachycardia.  The patient is a very pleasant 78 year old woman with longstanding tachy palpitations.  She was admitted to the hospital with shortness of breath and found to be in SVT at 140 beats per minute.  She has baseline left bundle-branch block, and this was a left bundle-branch block tachycardia.  She is now referred for catheter ablation.  Of note, the patient underwent catheter ablation approximately 3 years ago, and at that time was found to have AV node reentrant tachycardia and slow pathway modification was carried out.  However, she had persistent tachycardia documented by cardiac monitoring after her ablation.  PROCEDURE:  After informed consent was obtained, the patient was taken to the Diagnostic EP lab in the fasting state.  After usual preparation draping, intravenous fentanyl and midazolam was given for sedation.  A 6- [place] hexapolar catheter was inserted percutaneously in the right jugular vein and advanced to the coronary sinus.  A 6-French quadripolar catheter was inserted percutaneously in the right femoral vein and advanced to right ventricle.  A 6-French quadripolar catheter was inserted percutaneously in the right femoral vein and advanced to the His bundle region.  Rather rapid ventricular pacing was then carried out demonstrating VA Wenckebach at 360 milliseconds.  During rapid ventricular pacing, the atrial activation sequence was acentric though it  was difficult to tell whether it was decremental or not.  Programmed ventricular stimulation was then carried out demonstrating retrograde AV node ERP of 500/320.  During programmed ventricular stimulation, the atrial activation was again eccentric, minimally so and nondecremental. Programmed rather rapid atrial pacing was then carried out from the coronary sinus and the high right atrium, and this demonstrated no inducible SVT.  The AV Wenckebach cycle length was 320 msec.  During rapid atrial pacing, the PR interval was less than the RR interval and there was initially no SVT.  Finally programmed atrial stimulation was carried out from the atrium at a base cycle length of 500 milliseconds. This was 2 interval stepwise decreased down to 300 milliseconds where the AV node ERP was observed.  During programmed atrial stimulation, there was no pre-excitation of the atrium and the PR interval was less than the RR interval and there was no inducible SVT.  At this point, isoproterenol was infused at rates of 1-4 mcg per minute.  Additional rapid atrial pacing, rapid ventricular pacing, programmed atrial stimulation, and programmed ventricular stimulation were carried out. Rapid ventricular pacing resulted in the initiation of SVT.  During SVT, the cycle length varied somewhat with the amount of isoproterenol being utilized.  In general, the SVT was present at approximately 170 beats per minute.  PVCs replaced  at the time of His bundle refractoriness which clearly pre-excited the atrium.  Ventricular pacing during SVT demonstrated a VAV conduction sequence.  The atrial activation appeared initially earliest in the proximal CS catheter.  At this point, the right femoral artery was punctured and a 8-French sheath was advanced into the right femoral artery and a 7-French quadripolar ablation catheter was advanced into the systemic circulation.  Prior to crossing the diaphragm, 5000 units of heparin  was given intravenously.  The aortic valve was crossed retrograde and mapping of the mitral valve anulus was carried out.  There was a very dense ventricular ectopy during mapping of the mitral valve anulus making catheter manipulation very difficult.  Several RF energy applications were delivered to the atrial and ventricular insertion of the earliest atrial activation, and this had no effect on accessory pathway conduction.  At this point, the ablation catheter was moved from the right femoral artery and advanced through the right femoral vein into the right atrium and mapping of the coronary sinus was carried out.  In the proximal coronary sinus, RF energy was applied at 30 watts and this again demonstrated no change on the activation sequence, and persistence of inducible SVT despite persistently earlier atrial activations.  Finally, a manipulation of the right atrium was carried out, and a PFO was crossed and the mitral valve anulus was again mapped and the mid septal portion and the posteroseptal portion on the left side deliver RF energy application, with early atrial activation during ventricular pacing, and despite this, there remained persistent eccentric atrial activation and inducible SVT. Finally, the right mid septal space was mapped and very early activation was seen with ventricular pacing.  RF energy was then applied to the right mid septal space.  There was accelerated junctional rhythm. Following RF energy application, rapid ventricular pacing demonstrated midline atrial activation, and there was no inducible SVT.  The AV Wenckebach cycle length after mid septal pathway ablation was 490 milliseconds, and the PR interval had increased from around 180 msec to 300 msec.  I said there was persistent one-to-one AV conduction.  At this point, the catheters were removed.  Hemostasis was assured and the patient was returned to her room in satisfactory  condition.  COMPLICATIONS:  There were no immediate procedure complications.  RESULTS:  A.  Baseline ECG.  Baseline ECG demonstrates sinus rhythm with left bundle-branch block. B.  Baseline intervals, the sinus node cycle length was 1000 msec.  QRS duration was 148 milliseconds.  The HV interval was 42 milliseconds. C.  Rapid ventricular pacing.  Rapid atrial pacing was carried out from the right ventricle demonstrating eccentric atrial activation.  The CS56 was earlier than the HiSS catheter.  It should be noted the HiSS catheter sat more superiorly along the tricuspid valve anulus. D.  Programmed ventricular stimulation.  Programmed ventricular stimulation was carried out the right ventricle at a base drive cycle length of 500 milliseconds.  The S1-S2 interval stepwise decreased down to 420 milliseconds where the retrograde ERP was demonstrated.  The atrial activation was eccentric and not particularly decremental during programmed ventricular stimulation. E.  Programmed atrial stimulation.  Programmed atrial stimulation was carried out from the right atrium and the coronary sinus at base drive cycle length of 500 milliseconds.  The S1-S2 interval stepwise decreased from 440 milliseconds down to the AV node ERP.  Programmed atrial stimulation demonstrated multiple echo beats, but no inducible SVT.  The PR interval was less than the RR interval was  programmed atrial stimulation. F.  Rapid atrial pacing.  Rapid atrial pacing was carried out from the atrium at base drive cycle length of 600 millisecond stepwise decreased down to 320 milliseconds where AV Wenckebach was observed.  During rapid atrial pacing, there was no inducible SVT initially.  On heparin there was inducible SVT. G.  Arrhythmias observed. 1. AV reentrant tachycardia initiation was with rapid atrial pacing as     well as rapid ventricular pacing and the duration was sustained.     The cycle length was 305  milliseconds.  Method of termination was     spontaneous.  This arrhythmia was induced with isoproterenol.     a.     Mapping.  Mapping was very difficult.  The atrial activation      sequence initially appeared earliest on the left postdural septal      space, but subsequent mapping demonstrated the earliest atrial      activation to be in the right mid septal space.     b.     RF energy application.  Following right RF energy was      applied from the left posteroseptal space along the left mid      septum as well as the right mid septal space and inside the      coronary sinus os.  Following catheter ablation, the rhythm in the      right mid septal space. There was no inducible SVT.  The PR      interval after ablation however was prolonged from 190      milliseconds to 300 milliseconds.  Following ablation, AV      Wenckebach was 490 milliseconds.  CONCLUSION:  The study demonstrates apparent successful catheter ablation of a right mid septal accessory pathway (retrograde only conduction) with RF energy applied to the right mid septal space. Following ablation, there was no inducible SVT and the AV conduction was intact.     Champ Mungo. Lovena Le, MD     GWT/MEDQ  D:  10/11/2012  T:  10/12/2012  Job:  [id number]

## 2012-10-12 NOTE — Progress Notes (Signed)
Patient ID: [name], female   DOB: [birth year], 78 y.o.   MRN: [id number] Subjective:  "I am tired"  Objective:  Vital Signs in the last 24 hours: Temp:  [98.3 F (36.8 C)-100.1 F (37.8 C)] 100.1 F (37.8 C) (12/20 0450) Pulse Rate:  [45-96] 96  (12/20 0450) Resp:  [10-22] 16  (12/20 0450) BP: (104-151)/(48-90) 109/61 mmHg (12/20 0450) SpO2:  [97 %-100 %] 100 % (12/20 0450) Weight:  [193 lb 2 oz (87.6 kg)] 193 lb 2 oz (87.6 kg) (12/20 0000)  Intake/Output from previous day: 12/19 0701 - 12/20 0700 In: 240 [P.O.:240] Out: 1000 [Urine:1000] Intake/Output from this shift:    Physical Exam: Well appearing, sleepy, elderly woman, NAD HEENT: Unremarkable Neck:  No JVD, no thyromegally Lungs:  Clear with no wheezes HEART:  Regular rate rhythm, no murmurs, no rubs, no clicks Abd:  Flat, positive bowel sounds, no organomegally, no rebound, no guarding Ext:  2 plus pulses, no edema, no cyanosis, no clubbing Skin:  No rashes no nodules Neuro:  CN II through XII intact, motor grossly intact  Lab Results:  [facility] 10/10/12 1618  WBC 5.0  HGB 11.6*  PLT 149*    Basename 10/12/12 0550 10/11/12 0839  NA 139 141  K 3.8 4.0  CL 103 102  CO2 27 29  GLUCOSE 263* 267*  BUN 22 23  CREATININE 0.95 0.86    Basename 10/11/12 0839 10/11/12 0118  TROPONINI <0.30 <0.30   Hepatic Function Panel No results found for this basename: PROT,ALBUMIN,AST,ALT,ALKPHOS,BILITOT,BILIDIR,IBILI in the last 72 hours No results found for this basename: CHOL in the last 72 hours No results found for this basename: PROTIME in the last 72 hours  Imaging: Dg Chest Portable 1 View  10/10/2012  *RADIOLOGY REPORT*  Clinical Data: Chest pain, shortness of breath, on medication for hypertension  PORTABLE CHEST - 1 VIEW  Comparison: Chest x-ray of 01/12/2012  Findings: No active infiltrate or effusion is seen.  Very minimal patchy opacity is noted in the left upper lung field probably representing  atelectasis but if symptoms persist or worsen follow- up chest x-ray may be warranted.  The heart is mildly enlarged. There is mild thoracic curvature convex to the right with degenerative change.  IMPRESSION:  1.  Minimal opacity in the left upper lung field possibly due to atelectasis or scarring.  Consider follow-up if clinically warranted. 2. Mild cardiomegaly   Original Report Authenticated By: Ivar Drape, M.D.     Cardiac Studies: Tele - NSR with SVT Assessment/Plan:  1. Recurrent SVT 2. LBBB 3. S/p redo EPS/RFA of SVT. Rec: the patient had no inducible SVT at the end of her procedure and now has already had more SVT overnight. She appears to have multiple exit sites of her accessory pathway. I will start amiodarone at leas as a short term treatment. I suspect she will ultimately require PPM +/- AV node ablation. Would keep in hospital over the weekend observing while initiating amio. Dr. Rayann Heman and Caryl Comes will be covering me next week.   LOS: 2 days    Dashawn Golda,M.D. 10/12/2012, 8:00 AM

## 2012-10-12 NOTE — Evaluation (Signed)
Physical Therapy Evaluation Patient Details Name: [name] MRN: [id number] DOB: [birth year] Today's Date: 10/12/2012 Time: [id number] PT Time Calculation (min): 25 min  PT Assessment / Plan / Recommendation Clinical Impression  Patient is a 78 yo female admitted with SOB and tachycardia.  Pta, patient was able to be at home alone during the day.  Mobility has greatly decreased.  Patient will benefit from acute PT to maximize independence prior to discharge.  Recommend Inpatient Rehab consult for comprehensive therapy with goal to return home.    PT Assessment  Patient needs continued PT services    Follow Up Recommendations  CIR    Does the patient have the potential to tolerate intense rehabilitation      Barriers to Discharge Decreased caregiver support      Equipment Recommendations  Rolling walker with 5" wheels    Recommendations for Other Services Rehab consult;OT consult   Frequency Min 3X/week    Precautions / Restrictions Precautions Precautions: Fall Restrictions Weight Bearing Restrictions: No   Pertinent Vitals/Pain Pain limiting mobility.      Mobility  Bed Mobility Bed Mobility: Supine to Sit;Sitting - Scoot to [employer] of Bed;Sit to Supine Supine to Sit: 4: Min assist;With rails;HOB elevated Sitting - Scoot to Edge of Bed: 3: Mod assist;With rail Sit to Supine: 3: Mod assist;With rail;HOB elevated Details for Bed Mobility Assistance: Verbal cues for technique.  Assist needed to move RLE off of bed, and bil. LE's onto bed. Transfers Transfers: Sit to Stand;Stand to Sit Sit to Stand: 3: Mod assist;From elevated surface;With upper extremity assist;From bed Stand to Sit: 3: Mod assist;With upper extremity assist;To bed Details for Transfer Assistance: Verbal cues for hand placement.  Maximal effort to get up to standing position.  Once upright, noted right knee buckling.  Able to stand 20 seconds and returned to sitting.  Pain and weakness limiting mobility.   Unable to take steps. Ambulation/Gait Ambulation/Gait Assistance: Not tested (comment)           PT Diagnosis: Difficulty walking;Generalized weakness;Acute pain  PT Problem List: Decreased strength;Decreased activity tolerance;Decreased balance;Decreased mobility;Decreased knowledge of use of DME;Cardiopulmonary status limiting activity;Obesity;Pain PT Treatment Interventions: DME instruction;Gait training;Functional mobility training;Therapeutic activities;Therapeutic exercise;Balance training;Patient/family education   PT Goals Acute Rehab PT Goals PT Goal Formulation: With patient Time For Goal Achievement: 10/19/12 Potential to Achieve Goals: Good Pt will go Supine/Side to Sit: Independently;with HOB 0 degrees PT Goal: Supine/Side to Sit - Progress: Goal set today Pt will go Sit to Supine/Side: Independently;with HOB 0 degrees PT Goal: Sit to Supine/Side - Progress: Goal set today Pt will go Sit to Stand: with min assist;with upper extremity assist PT Goal: Sit to Stand - Progress: Goal set today Pt will Transfer Bed to Chair/Chair to Bed: with min assist PT Transfer Goal: Bed to Chair/Chair to Bed - Progress: Goal set today Pt will Ambulate: 16 - 50 feet;with min assist;with rolling walker PT Goal: Ambulate - Progress: Goal set today  Visit Information  Last PT Received On: 10/12/12 Assistance Needed: +2    Subjective Data  Subjective: "My knees are usually a lot bigger"  "They are both painful, with the right one worse" Patient Stated Goal: To be able to go home.   Prior Functioning  Home Living Lives With: Other (Comment) (Granddaughter) Available Help at Discharge: Family;Available PRN/intermittently Type of Home: House Home Access: Stairs to enter [employer] of Steps: 2 Entrance Stairs-Rails: None Home Layout: One level Bathroom Toilet: Standard Bathroom Accessibility: Yes How  Accessible: Accessible via walker Home Adaptive Equipment: Shower chair  with back;Walker - standard;Straight cane;Raised toilet seat with rails Prior Function Level of Independence: Independent with assistive device(s);Needs assistance Needs Assistance: Meal Prep;Light Housekeeping Meal Prep: Moderate Light Housekeeping: Maximal Able to Take Stairs?: Yes (with assist) Driving: No Vocation: Retired Corporate investment banker: No difficulties    Cognition  Overall Cognitive Status: Appears within functional limits for tasks assessed/performed Arousal/Alertness: Awake/alert Orientation Level: Appears intact for tasks assessed Behavior During Session: [facility] for tasks performed    Extremity/Trunk Assessment Right Upper Extremity Assessment RUE ROM/Strength/Tone: Deficits RUE ROM/Strength/Tone Deficits: Strength grossly 4/5 RUE Sensation: History of peripheral neuropathy Left Upper Extremity Assessment LUE ROM/Strength/Tone: Deficits LUE ROM/Strength/Tone Deficits: Strength 4/5 LUE Sensation: History of peripheral neuropathy Right Lower Extremity Assessment RLE ROM/Strength/Tone: Deficits RLE ROM/Strength/Tone Deficits: Strength 3/5.  Noted edema in knee RLE Sensation: History of peripheral neuropathy Left Lower Extremity Assessment LLE ROM/Strength/Tone: Deficits LLE ROM/Strength/Tone Deficits: Strength 4-/5.  Edema in knee LLE Sensation: History of peripheral neuropathy   Balance Balance Balance Assessed: Yes Static Sitting Balance Static Sitting - Balance Support: No upper extremity supported;Feet supported Static Sitting - Level of Assistance: 5: Stand by assistance Static Sitting - Comment/# of Minutes: Patient able to sit at EOB x 8 minutes with upright posture and good balance.  End of Session PT - End of Session Equipment Utilized During Treatment: Gait belt Activity Tolerance: Patient limited by pain Patient left: in bed;with call bell/phone within reach Nurse Communication: Mobility status  GP     Despina Pole 10/12/2012, 6:45  PM Carita Pian. Sanjuana Kava, [place] Pager [phone]

## 2012-10-12 NOTE — Progress Notes (Signed)
SUBJECTIVE:  S/p EP study, but more SVT overnight.  Plan is to initiate Amiodarone.  Pacer may be needed.  OBJECTIVE:   Vitals:   Filed Vitals:   10/11/12 2330 10/12/12 0000 10/12/12 0450 10/12/12 0800  BP: 133/68 130/57 109/61 147/65  Pulse: 70 80 96 75  Temp:  99.1 F (37.3 C) 100.1 F (37.8 C) 100 F (37.8 C)  TempSrc:  Oral Oral Oral  Resp: 19 18 16 22   Height:      Weight:  87.6 kg (193 lb 2 oz)    SpO2: 98% 100% 100% 100%   I&O's:    Intake/Output Summary (Last 24 hours) at 10/12/12 1134 Last data filed at 10/12/12 0600  Gross per 24 hour  Intake    240 ml  Output    350 ml  Net   -110 ml   TELEMETRY: Reviewed telemetry pt in NSR:     PHYSICAL EXAM General: Well developed, well nourished, in no acute distress Head: Normal cephalic and atramatic  Lungs:   No wheezing Heart: NSR S1 S2 Pulses are 2+ & equal.             Abdomen: , abdomen soft and non-tender  Msk: . Normal strength and tone for age. Extremities:   1+ bilateral edema.  DP +1 Neuro: Alert and oriented X 3. Psych:  Normal affect, responds appropriately   LABS: Basic Metabolic Panel:  Basename 10/12/12 0550 10/11/12 0839  NA 139 141  K 3.8 4.0  CL 103 102  CO2 27 29  GLUCOSE 263* 267*  BUN 22 23  CREATININE 0.95 0.86  CALCIUM 9.0 9.1  MG -- --  PHOS -- --   Liver Function Tests: No results found for this basename: AST:2,ALT:2,ALKPHOS:2,BILITOT:2,PROT:2,ALBUMIN:2 in the last 72 hours No results found for this basename: LIPASE:2,AMYLASE:2 in the last 72 hours CBC:  Basename 10/10/12 1618  WBC 5.0  NEUTROABS 2.6  HGB 11.6*  HCT 35.4*  MCV 89.6  PLT 149*   Cardiac Enzymes:  Basename 10/11/12 0839 10/11/12 0118 10/10/12 2004  CKTOTAL 263* 284* 346*  CKMB 2.0 2.4 2.8  CKMBINDEX -- -- --  TROPONINI <0.30 <0.30 <0.30   BNP: No components found with this basename: POCBNP:3 D-Dimer: No results found for this basename: DDIMER:2 in the last 72 hours Hemoglobin A1C:  Basename  10/10/12 2004  HGBA1C 9.1*   Fasting Lipid Panel: No results found for this basename: CHOL,HDL,LDLCALC,TRIG,CHOLHDL,LDLDIRECT in the last 72 hours Thyroid Function Tests:  Basename 10/10/12 2004  TSH 0.843  T4TOTAL --  T3FREE --  THYROIDAB --   Anemia Panel: No results found for this basename: VITAMINB12,FOLATE,FERRITIN,TIBC,IRON,RETICCTPCT in the last 72 hours Coag Panel:   Lab Results  Component Value Date   INR 1.75* 10/12/2012   INR 1.98* 10/11/2012   INR 2.06* 10/10/2012    RADIOLOGY: Dg Chest Portable 1 View  10/10/2012  *RADIOLOGY REPORT*  Clinical Data: Chest pain, shortness of breath, on medication for hypertension  PORTABLE CHEST - 1 VIEW  Comparison: Chest x-ray of 01/12/2012  Findings: No active infiltrate or effusion is seen.  Very minimal patchy opacity is noted in the left upper lung field probably representing atelectasis but if symptoms persist or worsen follow- up chest x-ray may be warranted.  The heart is mildly enlarged. There is mild thoracic curvature convex to the right with degenerative change.  IMPRESSION:  1.  Minimal opacity in the left upper lung field possibly due to atelectasis or scarring.  Consider follow-up if  clinically warranted. 2. Mild cardiomegaly   Original Report Authenticated By: Ivar Drape, M.D.       ASSESSMENT:SVT, LBBB  PLAN:  S/p ablation.  Amiodarone to treat SVT with LBBB.  She will be watched over the weekend per EP.   DVT. INR  below 2.0. Pharmacy to dose coumadin.  Lovenox today due to subtherapeutic INR.  Ruled out for MI.  DM.  Poorly controlled.  Continue sliding scale for now.  Diastolic dysfunction/Fluid overload.  Diuresing as well.  Feels better after -2L.  Follow electrolytes.   Jettie Booze., MD  10/12/2012  11:34 AM

## 2012-10-12 NOTE — Progress Notes (Signed)
ANTICOAGULATION CONSULT NOTE - Follow Up Consult  Pharmacy Consult for Coumadin Indication: history of DVT  Allergies  Allergen Reactions  . Aspirin   . Penicillins     Patient Measurements: Height: 5\' 5"  (165.1 cm) Weight: 193 lb 2 oz (87.6 kg) IBW/kg (Calculated) : 57    Vital Signs: Temp: 100 F (37.8 C) (12/20 0800) Temp src: Oral (12/20 0800) BP: 147/65 mmHg (12/20 0800) Pulse Rate: 75  (12/20 0800)  Labs:  Basename 10/12/12 0550 10/11/12 0839 10/11/12 0118 10/10/12 2004 10/10/12 1618  HGB -- -- -- -- 11.6*  HCT -- -- -- -- 35.4*  PLT -- -- -- -- 149*  APTT -- -- -- 32 --  LABPROT 19.8* 21.7* -- 22.4* --  INR 1.75* 1.98* -- 2.06* --  HEPARINUNFRC -- -- -- -- --  CREATININE 0.95 0.86 -- -- 0.94  CKTOTAL -- 263* 284* 346* --  CKMB -- 2.0 2.4 2.8 --  TROPONINI -- <0.30 <0.30 <0.30 --    Estimated Creatinine Clearance: 54.2 ml/min (by C-G formula based on Cr of 0.95).   Assessment: 78 year old female admitted with palpitations and chest pain attributed to atrial tachycardia vs. Atrial flutter. She is on chronic anticoagulation with Coumadin for history of DVT.  Admit INR is 1.98 which is essentially therapeutic. INR goal of 2-3. Warfarin home dose: 12.5 mg q TTSS and 15 mg qMWF. No bleeding reported. History of thrombocytopenia noted.   INR SUB-therapeutic at 1.75 this AM. 12/18: pltc 149 (above baseline of ~100K), Hgb 11.6- no new CBC To start amiodarone load today and will stay inpt through the w/e Expect INR to increase over the next few days with addition of amio,  LFTs wnl 08/12, no new cmet, no PFTs Drug interactions noted with atorvastatin, colesevelam Atorvastatin 20 mg dose likely ok, colesevelam already spaced appropriately to limit interaction with amio  Goal of Therapy:  INR 2-3 Monitor platelets by anticoagulation protocol: Yes   Plan:  -Coumadin 15mg  PO x 1 tonight to get back into therapeutic range -Will d/c standing Coumadin orders as  dosing will likely change with addition of amiodarone -Daily PT/INR -CBC in am and at least q72h while inpt -F/u s/sx bleeding  Thank you for the consult.  Johny Drilling, PharmD Clinical Pharmacist Pager: [phone] Pharmacy: [phone] 10/12/2012 9:50 AM

## 2012-10-12 NOTE — Progress Notes (Signed)
Pt has been in bed all night, assisted up to bsc, very weak and c/o rt knee stiff.  Used bsc w/ RN assist w/ great difficulty.  Tele showed HR up to 140 w/ getting up, otherwise 80-100's at rest.  Dr Lovena Le informed, PT consult requested.

## 2012-10-13 LAB — CBC
Hemoglobin: 10.4 g/dL — ABNORMAL LOW (ref 12.0–15.0)
MCH: 29.8 pg (ref 26.0–34.0)
RBC: 3.49 MIL/uL — ABNORMAL LOW (ref 3.87–5.11)

## 2012-10-13 LAB — BASIC METABOLIC PANEL
CO2: 24 mEq/L (ref 19–32)
Calcium: 8.8 mg/dL (ref 8.4–10.5)
Chloride: 100 mEq/L (ref 96–112)
Glucose, Bld: 231 mg/dL — ABNORMAL HIGH (ref 70–99)
Sodium: 136 mEq/L (ref 135–145)

## 2012-10-13 LAB — GLUCOSE, CAPILLARY
Glucose-Capillary: 224 mg/dL — ABNORMAL HIGH (ref 70–99)
Glucose-Capillary: 372 mg/dL — ABNORMAL HIGH (ref 70–99)
Glucose-Capillary: 245 mg/dL — ABNORMAL HIGH (ref 70–99)
Glucose-Capillary: 220 mg/dL — ABNORMAL HIGH (ref 70–99)

## 2012-10-13 LAB — PROTIME-INR: Prothrombin Time: 22.3 seconds — ABNORMAL HIGH (ref 11.6–15.2)

## 2012-10-13 MED ORDER — POTASSIUM CHLORIDE ER 10 MEQ PO TBCR
20.0000 meq | EXTENDED_RELEASE_TABLET | Freq: Two times a day (BID) | ORAL | Status: DC
  Administered 2012-10-14: 20 meq via ORAL
  Filled 2012-10-13 (×2): qty 2

## 2012-10-13 MED ORDER — WARFARIN SODIUM 2.5 MG PO TABS
12.5000 mg | ORAL_TABLET | Freq: Once | ORAL | Status: AC
  Administered 2012-10-13: 12.5 mg via ORAL
  Filled 2012-10-13: qty 1

## 2012-10-13 MED ORDER — FUROSEMIDE 40 MG PO TABS
40.0000 mg | ORAL_TABLET | Freq: Two times a day (BID) | ORAL | Status: DC
  Administered 2012-10-13 – 2012-10-14 (×3): 40 mg via ORAL
  Filled 2012-10-13 (×5): qty 1

## 2012-10-13 MED ORDER — POTASSIUM CHLORIDE CRYS ER 20 MEQ PO TBCR
40.0000 meq | EXTENDED_RELEASE_TABLET | Freq: Two times a day (BID) | ORAL | Status: AC
  Administered 2012-10-13 (×2): 40 meq via ORAL
  Filled 2012-10-13 (×2): qty 2

## 2012-10-13 MED ORDER — POTASSIUM CHLORIDE CRYS ER 20 MEQ PO TBCR: 20.0000 meq | EXTENDED_RELEASE_TABLET | Freq: Every day | ORAL | Status: DC

## 2012-10-13 NOTE — Progress Notes (Signed)
ANTICOAGULATION CONSULT NOTE - Follow Up Consult  Pharmacy Consult for Coumadin Indication: Hx DVT  Allergies  Allergen Reactions  . Aspirin   . Penicillins     Patient Measurements: Height: 5\' 5"  (165.1 cm) Weight: 191 lb 9.3 oz (86.9 kg) IBW/kg (Calculated) : 57  Heparin Dosing Weight:   Vital Signs: Temp: 98.2 F (36.8 C) (12/21 0507) Temp src: Oral (12/21 0507) BP: 124/60 mmHg (12/21 1116) Pulse Rate: 62  (12/21 1116)  Labs:  Basename 10/13/12 0500 10/12/12 0550 10/11/12 0839 10/11/12 0118 10/10/12 2004 10/10/12 1618  HGB 10.4* -- -- -- -- 11.6*  HCT 31.1* -- -- -- -- 35.4*  PLT 112* -- -- -- -- 149*  APTT -- -- -- -- 32 --  LABPROT 22.3* 19.8* 21.7* -- -- --  INR 2.05* 1.75* 1.98* -- -- --  HEPARINUNFRC -- -- -- -- -- --  CREATININE 1.25* 0.95 0.86 -- -- --  CKTOTAL -- -- 263* 284* 346* --  CKMB -- -- 2.0 2.4 2.8 --  TROPONINI -- -- <0.30 <0.30 <0.30 --    Estimated Creatinine Clearance: 41.1 ml/min (by C-G formula based on Cr of 1.25).  Assessment: 79yof on Coumadin for hx DVT. INR (2.05) is therapeutic on patient's home regimen (12.5mg  daily except 15mg  on MWF) - will continue for now. Patient was started on amiodarone on 12/20 which can increase the INR (effects usually not seen for 5-7 days) - monitor closely. - H/H and Plts trending down - monitor - No significant bleeding reported  Goal of Therapy:  INR 2-3   Plan:  1. Continue home Coumadin regimen - 12.5mg  due today 2. Follow-up AM INR  Earleen Newport [id number] 10/13/2012,12:36 PM

## 2012-10-13 NOTE — Progress Notes (Addendum)
SUBJECTIVE:  No complaints this am  OBJECTIVE:   Vitals:   Filed Vitals:   10/12/12 0800 10/12/12 1432 10/12/12 2204 10/13/12 0507  BP: 147/65 111/67 120/72 124/76  Pulse: 75 98 94 58  Temp: 100 F (37.8 C) 98.7 F (37.1 C) 98.1 F (36.7 C) 98.2 F (36.8 C)  TempSrc: Oral Oral Oral Oral  Resp: 22 19 20 20   Height:      Weight:    86.9 kg (191 lb 9.3 oz)  SpO2: 100% 93% 92% 98%   I&O's:   Intake/Output Summary (Last 24 hours) at 10/13/12 0827 Last data filed at 10/13/12 0405  Gross per 24 hour  Intake    120 ml  Output   1275 ml  Net  -1155 ml   TELEMETRY: Reviewed telemetry pt in atrial fibrillation with CVR:     PHYSICAL EXAM General: Well developed, well nourished, in no acute distress Head: Eyes PERRLA, No xanthomas.   Normal cephalic and atramatic  Lungs:   Clear bilaterally to auscultation and percussion. Heart:   Irregularly irregular S1 S2 Pulses are 2+ & equal. Abdomen: Bowel sounds are positive, abdomen soft and non-tender without masses Extremities:   No clubbing, cyanosis or edema.  DP +1 Neuro: Alert and oriented X 3. Psych:  Good affect, responds appropriately   LABS: Basic Metabolic Panel:  Basename 10/13/12 0500 10/12/12 0550  NA 136 139  K 3.1* 3.8  CL 100 103  CO2 24 27  GLUCOSE 231* 263*  BUN 25* 22  CREATININE 1.25* 0.95  CALCIUM 8.8 9.0  MG -- --  PHOS -- --   Liver Function Tests: No results found for this basename: AST:2,ALT:2,ALKPHOS:2,BILITOT:2,PROT:2,ALBUMIN:2 in the last 72 hours No results found for this basename: LIPASE:2,AMYLASE:2 in the last 72 hours CBC:  Basename 10/13/12 0500 10/10/12 1618  WBC 9.1 5.0  NEUTROABS -- 2.6  HGB 10.4* 11.6*  HCT 31.1* 35.4*  MCV 89.1 89.6  PLT 112* 149*   Cardiac Enzymes:  Basename 10/11/12 0839 10/11/12 0118 10/10/12 2004  CKTOTAL 263* 284* 346*  CKMB 2.0 2.4 2.8  CKMBINDEX -- -- --  TROPONINI <0.30 <0.30 <0.30   BNP: No components found with this basename:  POCBNP:3 D-Dimer: No results found for this basename: DDIMER:2 in the last 72 hours Hemoglobin A1C:  Basename 10/10/12 2004  HGBA1C 9.1*   Fasting Lipid Panel: No results found for this basename: CHOL,HDL,LDLCALC,TRIG,CHOLHDL,LDLDIRECT in the last 72 hours Thyroid Function Tests:  Basename 10/10/12 2004  TSH 0.843  T4TOTAL --  T3FREE --  THYROIDAB --   Anemia Panel: No results found for this basename: VITAMINB12,FOLATE,FERRITIN,TIBC,IRON,RETICCTPCT in the last 72 hours Coag Panel:   Lab Results  Component Value Date   INR 2.05* 10/13/2012   INR 1.75* 10/12/2012   INR 1.98* 10/11/2012    RADIOLOGY: Dg Chest Portable 1 View  10/10/2012  *RADIOLOGY REPORT*  Clinical Data: Chest pain, shortness of breath, on medication for hypertension  PORTABLE CHEST - 1 VIEW  Comparison: Chest x-ray of 01/12/2012  Findings: No active infiltrate or effusion is seen.  Very minimal patchy opacity is noted in the left upper lung field probably representing atelectasis but if symptoms persist or worsen follow- up chest x-ray may be warranted.  The heart is mildly enlarged. There is mild thoracic curvature convex to the right with degenerative change.  IMPRESSION:  1.  Minimal opacity in the left upper lung field possibly due to atelectasis or scarring.  Consider follow-up if clinically warranted. 2. Mild  cardiomegaly   Original Report Authenticated By: Ivar Drape, M.D.       ASSESSMENT:  1.  SVT s/p redo ablation now on Amiodarone 2.  ? Afib now vs. NSR with very frequent PAC's or short bursts of SVT on Amiodarone - rate controlled 3.  LBBB 4.  DVT with therapeutic INR on Coumadin 5.  DM 6.  Diastolic dysfunction with volume overload - lungs clear today with good urine output 3L 7.  Hypokalemia  PLAN:   1.  Continue Amio load 2.  Coumadin per pharmacy 3.  Check 12 lead EKG to assess rhythm 4.  Replete potassium 5.  BMET in am 6.  Change Lasix to PO 40mg  BID  Sueanne Margarita, MD   10/13/2012  8:27 AM

## 2012-10-14 DIAGNOSIS — N289 Disorder of kidney and ureter, unspecified: Secondary | ICD-10-CM | POA: Diagnosis not present

## 2012-10-14 LAB — GLUCOSE, CAPILLARY
Glucose-Capillary: 207 mg/dL — ABNORMAL HIGH (ref 70–99)
Glucose-Capillary: 380 mg/dL — ABNORMAL HIGH (ref 70–99)
Glucose-Capillary: 247 mg/dL — ABNORMAL HIGH (ref 70–99)

## 2012-10-14 LAB — BASIC METABOLIC PANEL
Calcium: 8.7 mg/dL (ref 8.4–10.5)
Creatinine, Ser: 1.56 mg/dL — ABNORMAL HIGH (ref 0.50–1.10)
GFR calc Af Amer: 36 mL/min — ABNORMAL LOW (ref >90)
GFR calc non Af Amer: 31 mL/min — ABNORMAL LOW (ref >90)

## 2012-10-14 LAB — PROTIME-INR
INR: 2.76 — ABNORMAL HIGH (ref 0.00–1.49)
Prothrombin Time: 27.8 seconds — ABNORMAL HIGH (ref 11.6–15.2)

## 2012-10-14 MED ORDER — WARFARIN SODIUM 6 MG PO TABS
6.0000 mg | ORAL_TABLET | Freq: Once | ORAL | Status: AC
  Administered 2012-10-14: 6 mg via ORAL
  Filled 2012-10-14: qty 1

## 2012-10-14 NOTE — Progress Notes (Signed)
Cardiac monitor down from 0800-1000, client monitored and remained stable.  No complaint of pain or distress noted during this downtime.

## 2012-10-14 NOTE — Progress Notes (Addendum)
SUBJECTIVE:  No complaints this am  OBJECTIVE:   Vitals:   Filed Vitals:   10/13/12 2112 10/13/12 2130 10/14/12 0230 10/14/12 0604  BP: 104/59   100/60  Pulse: 79   52  Temp: 102.6 F (39.2 C) 99.1 F (37.3 C) 100.7 F (38.2 C) 100.3 F (37.9 C)  TempSrc: Oral Oral Oral Oral  Resp: 18   18  Height:      Weight:    87.4 kg (192 lb 10.9 oz)  SpO2: 96%   95%   I&O's:   Intake/Output Summary (Last 24 hours) at 10/14/12 0858 Last data filed at 10/13/12 2000  Gross per 24 hour  Intake    360 ml  Output    800 ml  Net   -440 ml   TELEMETRY: Reviewed telemetry pt in NSR  PHYSICAL EXAM General: Well developed, well nourished, in no acute distress Head: Eyes PERRLA, No xanthomas.   Normal cephalic and atramatic  Lungs:   Clear bilaterally to auscultation and percussion. Heart:   HRRR S1 S2 Pulses are 2+ & equal. Abdomen: Bowel sounds are positive, abdomen soft and non-tender without masses Extremities:   No clubbing, cyanosis or edema.  DP +1 Neuro: Alert and oriented X 3. Psych:  Good affect, responds appropriately   LABS: Basic Metabolic Panel:  Basename 10/14/12 0612 10/13/12 0500  NA 134* 136  K 4.1 3.1*  CL 99 100  CO2 22 24  GLUCOSE 228* 231*  BUN 33* 25*  CREATININE 1.56* 1.25*  CALCIUM 8.7 8.8  MG -- --  PHOS -- --   Liver Function Tests: No results found for this basename: AST:2,ALT:2,ALKPHOS:2,BILITOT:2,PROT:2,ALBUMIN:2 in the last 72 hours No results found for this basename: LIPASE:2,AMYLASE:2 in the last 72 hours CBC:  Basename 10/13/12 0500  WBC 9.1  NEUTROABS --  HGB 10.4*  HCT 31.1*  MCV 89.1  PLT 112*   Coag Panel:   Lab Results  Component Value Date   INR 2.76* 10/14/2012   INR 2.05* 10/13/2012   INR 1.75* 10/12/2012    RADIOLOGY: Dg Chest Portable 1 View  10/10/2012  *RADIOLOGY REPORT*  Clinical Data: Chest pain, shortness of breath, on medication for hypertension  PORTABLE CHEST - 1 VIEW  Comparison: Chest x-ray of 01/12/2012   Findings: No active infiltrate or effusion is seen.  Very minimal patchy opacity is noted in the left upper lung field probably representing atelectasis but if symptoms persist or worsen follow- up chest x-ray may be warranted.  The heart is mildly enlarged. There is mild thoracic curvature convex to the right with degenerative change.  IMPRESSION:  1.  Minimal opacity in the left upper lung field possibly due to atelectasis or scarring.  Consider follow-up if clinically warranted. 2. Mild cardiomegaly   Original Report Authenticated By: Ivar Drape, M.D.     ASSESSMENT:  1. SVT s/p redo ablation now on Amiodarone  2. ? Afib now vs. NSR with very frequent PAC's or short bursts of SVT on Amiodarone - now in ? accelerated junctional rhythm with LBBB 3. LBBB  4. DVT with therapeutic INR on Coumadin  5. DM  6. Diastolic dysfunction with volume overload - lungs clear today with good urine output 3.5L - given bump in creatinine and borderline low BP will hold Lasix for now - she already got her am dose 7. Hypokalemia - resolved 8.  Acute kidney injury secondary to diuretics - will hold lasix for now PLAN:  1. Continue Amio load  2.  Coumadin per pharmacy  3.  Further recs per EP in am 4.  Hold lasix  5.  BMET in am    Sueanne Margarita, MD  10/14/2012  8:58 AM

## 2012-10-14 NOTE — Progress Notes (Signed)
ANTICOAGULATION CONSULT NOTE - Follow Up Consult  Pharmacy Consult for Coumadin Indication: Hx DVT  Allergies  Allergen Reactions  . Aspirin   . Penicillins     Patient Measurements: Height: 5\' 5"  (165.1 cm) Weight: 192 lb 10.9 oz (87.4 kg) IBW/kg (Calculated) : 57  Heparin Dosing Weight:   Vital Signs: Temp: 100.3 F (37.9 C) (12/22 0604) Temp src: Oral (12/22 0604) BP: 94/54 mmHg (12/22 0931) Pulse Rate: 52  (12/22 0604)  Labs:  Basename 10/14/12 0612 10/13/12 0500 10/12/12 0550  HGB -- 10.4* --  HCT -- 31.1* --  PLT -- 112* --  APTT -- -- --  LABPROT 27.8* 22.3* 19.8*  INR 2.76* 2.05* 1.75*  HEPARINUNFRC -- -- --  CREATININE 1.56* 1.25* 0.95  CKTOTAL -- -- --  CKMB -- -- --  TROPONINI -- -- --    Estimated Creatinine Clearance: 33 ml/min (by C-G formula based on Cr of 1.56).  Assessment: 79yof on Coumadin for hx DVT. INR is therapeutic but significantly increased from yesterday due to amiodarone (started on 12/20). Patient's home regimen is 12.5mg  daily except 15mg  on MWF - will likely need to decrease weekly regimen by ~50% upon discharge.  - H/H and Plts trending down - monitor - No significant bleeding reported  Goal of Therapy:  INR 2-3   Plan:  1. Coumadin 6 mg po tonight 2. Follow-up AM INR  [facility] [place], Pharm.D., BCPS Clinical Pharmacist Pager: [phone] 10/14/2012 10:51 AM

## 2012-10-14 NOTE — Progress Notes (Signed)
Rehab Admissions Coordinator Note:  Patient was screened by Cleatrice Burke for appropriateness for an Inpatient Acute Rehab Consult. [employer] Medicare will not approve inpt rehab admission for this diagnosis. At this time, we are recommending [place] or home with [facility] and family support.  Cleatrice Burke, RN 10/14/2012, 11:14 AM  I can be reached at [phone].

## 2012-10-15 DIAGNOSIS — I471 Supraventricular tachycardia: Secondary | ICD-10-CM

## 2012-10-15 DIAGNOSIS — I498 Other specified cardiac arrhythmias: Principal | ICD-10-CM

## 2012-10-15 DIAGNOSIS — I440 Atrioventricular block, first degree: Secondary | ICD-10-CM

## 2012-10-15 LAB — BASIC METABOLIC PANEL
CO2: 22 mEq/L (ref 19–32)
Chloride: 100 mEq/L (ref 96–112)
Creatinine, Ser: 1.26 mg/dL — ABNORMAL HIGH (ref 0.50–1.10)
Glucose, Bld: 235 mg/dL — ABNORMAL HIGH (ref 70–99)

## 2012-10-15 LAB — GLUCOSE, CAPILLARY: Glucose-Capillary: 358 mg/dL — ABNORMAL HIGH (ref 70–99)

## 2012-10-15 LAB — PROTIME-INR: INR: 3.83 — ABNORMAL HIGH (ref 0.00–1.49)

## 2012-10-15 MED ORDER — AMIODARONE HCL 200 MG PO TABS
200.0000 mg | ORAL_TABLET | Freq: Every day | ORAL | Status: DC
  Administered 2012-10-15 – 2012-10-16 (×2): 200 mg via ORAL
  Filled 2012-10-15 (×2): qty 1

## 2012-10-15 MED ORDER — ALUM & MAG HYDROXIDE-SIMETH 200-200-20 MG/5ML PO SUSP
15.0000 mL | Freq: Four times a day (QID) | ORAL | Status: DC | PRN
  Administered 2012-10-15: 15 mL via ORAL
  Filled 2012-10-15: qty 30

## 2012-10-15 MED ORDER — ATENOLOL 25 MG PO TABS
25.0000 mg | ORAL_TABLET | Freq: Every day | ORAL | Status: DC
Start: 2012-10-15 — End: 2012-10-16
  Administered 2012-10-15 – 2012-10-16 (×2): 25 mg via ORAL
  Filled 2012-10-15 (×2): qty 1

## 2012-10-15 MED ORDER — FUROSEMIDE 40 MG PO TABS
40.0000 mg | ORAL_TABLET | Freq: Every day | ORAL | Status: DC
  Administered 2012-10-16: 40 mg via ORAL
  Filled 2012-10-15: qty 1

## 2012-10-15 NOTE — Progress Notes (Signed)
ANTICOAGULATION CONSULT NOTE - Follow Up Consult  Pharmacy Consult for Coumadin Indication: hx DVT  Allergies  Allergen Reactions  . Aspirin   . Penicillins     Patient Measurements: Height: 5\' 5"  (165.1 cm) Weight: 193 lb 2 oz (87.6 kg) IBW/kg (Calculated) : 57   Vital Signs: Temp: 98.3 F (36.8 C) (12/23 0531) Temp src: Oral (12/23 0531) BP: 108/66 mmHg (12/23 0531) Pulse Rate: 77  (12/23 0531)  Labs:  Basename 10/15/12 0450 10/14/12 0612 10/13/12 0500  HGB -- -- 10.4*  HCT -- -- 31.1*  PLT -- -- 112*  APTT -- -- --  LABPROT 35.4* 27.8* 22.3*  INR 3.83* 2.76* 2.05*  HEPARINUNFRC -- -- --  CREATININE 1.26* 1.56* 1.25*  CKTOTAL -- -- --  CKMB -- -- --  TROPONINI -- -- --    Estimated Creatinine Clearance: 40.8 ml/min (by C-G formula based on Cr of 1.26).  Assessment: 79yof on coumadin for hx DVT. INR is now supratherapeutic - likely due to amiodarone which was started 12/20. Dose decreased from 200mg  bid to 200mg  daily today by Dr. Rayann Heman. No bleeding reported. No CBC today. Weekly coumadin requirements will decrease with addition of amiodarone.  Goal of Therapy:  INR 2-3 Monitor platelets by anticoagulation protocol: Yes   Plan:  1) No coumadin tonight 2) Follow up INR in AM  Deboraha Sprang 10/15/2012,9:50 AM

## 2012-10-15 NOTE — Progress Notes (Signed)
Utilization review completed.  

## 2012-10-15 NOTE — Progress Notes (Signed)
Physical Therapy Treatment Patient Details Name: [name] MRN: [id number] DOB: 07-28-1933 Today's Date: 10/15/2012 Time: 1040-1106 PT Time Calculation (min): 26 min  PT Assessment / Plan / Recommendation Comments on Treatment Session  pt rpesents s/p Ablation.  pt moving slowly and requires A for all aspects of mobility.  Discussed with pt benefits of D/C to SNF for rehab secondary to still having difficulty mobilizing and pt states there will be times during the day that she is alone.  pt indicated interest in finding a SNF near her family in [place].      Follow Up Recommendations  SNF     Does the patient have the potential to tolerate intense rehabilitation     Barriers to Discharge        Equipment Recommendations  Rolling walker with 5" wheels    Recommendations for Other Services OT consult  Frequency Min 3X/week   Plan Discharge plan needs to be updated;Frequency remains appropriate    Precautions / Restrictions Precautions Precautions: Fall Restrictions Weight Bearing Restrictions: No   Pertinent Vitals/Pain Indicates painful R knee during mobility, but better once resting in chair.      Mobility  Bed Mobility Bed Mobility: Not assessed Transfers Transfers: Sit to Stand;Stand to Sit Sit to Stand: 1: +2 Total assist;With upper extremity assist;From chair/3-in-1;With armrests Sit to Stand: Patient Percentage: 60% Stand to Sit: 4: Min assist;With upper extremity assist;To chair/3-in-1;With armrests Details for Transfer Assistance: Cues for UE use, anterior wt shift with sit to stand, and cues to control descent to chair.   Ambulation/Gait Ambulation/Gait Assistance: 4: Min assist Ambulation Distance (Feet): 20 Feet Assistive device: Rolling walker Ambulation/Gait Assistance Details: Cues and A for positioning in RW, upright posture, encouragement.  pt indicates pain in her R knee with mobility.   Gait Pattern: Step-through pattern;Decreased step length -  left;Decreased stance time - right;Trunk flexed Stairs: No Wheelchair Mobility Wheelchair Mobility: No    Exercises     PT Diagnosis:    PT Problem List:   PT Treatment Interventions:     PT Goals Acute Rehab PT Goals Time For Goal Achievement: 10/19/12 PT Goal: Sit to Stand - Progress: Progressing toward goal PT Goal: Ambulate - Progress: Progressing toward goal  Visit Information  Last PT Received On: 10/15/12 Assistance Needed: +2    Subjective Data  Subjective: This R knee just isn't acting right.     Cognition  Overall Cognitive Status: Appears within functional limits for tasks assessed/performed Arousal/Alertness: Awake/alert Orientation Level: Appears intact for tasks assessed Behavior During Session: [facility] for tasks performed    Balance  Balance Balance Assessed: No  End of Session PT - End of Session Equipment Utilized During Treatment: Gait belt Activity Tolerance: Patient limited by fatigue Patient left: in chair;with call bell/phone within reach Nurse Communication: Mobility status   GP     Catarina Hartshorn, [place] 10/15/2012, 11:15 AM

## 2012-10-15 NOTE — Progress Notes (Signed)
Inpatient Diabetes Program Recommendations  AACE/ADA: New Consensus Statement on Inpatient Glycemic Control (2013)  Target Ranges:  Prepandial:   less than 140 mg/dL      Peak postprandial:   less than 180 mg/dL (1-2 hours)      Critically ill patients:  140 - 180 mg/dL   Reason for Visit: Hyperglycemia      Inpatient Diabetes Program Recommendations Insulin - Basal: Receiving lantus 25 units daily Insulin - Meal Coverage: CBG pattern suggests meal coverage may be helpful.  Please consider adding 6 units tid with meals (and titrate as indicated)  in addition to correction scale.  Note:  Results for ZAKIYYA, HYPES (MRN [id number]) as of 10/15/2012 12:05  Ref. Range 10/14/2012 06:06 10/14/2012 11:05 10/14/2012 15:48 10/14/2012 21:25 10/15/2012 06:39 10/15/2012 11:03  Glucose-Capillary Latest Range: 70-99 mg/dL 207 (H) 380 (H) 321 (H) 247 (H) 225 (H) 358 (H)   Thank you.  Merlen Gurry S. Marcelline Mates, RN, CNS, CDE Inpatient Diabetes Program, team pager [phone] or [phone]

## 2012-10-15 NOTE — Progress Notes (Signed)
Met with patient today- per PT recommendation- patient now agrees to short term SNF.  She lives with her granddaughter but does not have anyone with her during the day.  SNF bed search process discussed and initiated in [facility]. Plan d/c to SNF when medicaly stable per MD.  Butch Penny T. [place], [place]

## 2012-10-15 NOTE — Progress Notes (Signed)
SUBJECTIVE: The patient is doing well today.  She reports mild abdominal pain.  At this time, she denies chest pain, shortness of breath, dizziness, or any new concerns.     [name] amiodarone  200 mg Oral Daily  . atenolol  25 mg Oral Daily  . atorvastatin  20 mg Oral q1800  . Colesevelam HCl  3.75 g Oral Daily  . felodipine  10 mg Oral Daily  . ferrous gluconate  324 mg Oral Q breakfast  . insulin aspart  0-15 Units Subcutaneous TID WC  . insulin glargine  25 Units Subcutaneous Daily  . oxybutynin  5 mg Oral BID  . pantoprazole  80 mg Oral Daily  . sodium chloride  3 mL Intravenous Q12H  . Vitamin D (Ergocalciferol)  50,000 Units Oral Custom  . Warfarin - Pharmacist Dosing Inpatient   Does not apply q1800      OBJECTIVE: Physical Exam: Filed Vitals:   10/14/12 0931 10/14/12 1330 10/14/12 2034 10/15/12 0531  BP: 94/54 92/61 102/62 108/66  Pulse:  80 74 77  Temp:  98.5 F (36.9 C) 98.3 F (36.8 C) 98.3 F (36.8 C)  TempSrc:  Oral Oral Oral  Resp:  19 20 20   Height:      Weight:    193 lb 2 oz (87.6 kg)  SpO2:  98% 98% 97%    Intake/Output Summary (Last 24 hours) at 10/15/12 0759 Last data filed at 10/14/12 2043  Gross per 24 hour  Intake    660 ml  Output    700 ml  Net    -40 ml    Telemetry reveals sinus rhythm with first degree AV block,  Occasional junctional rhythm is also noted  GEN- The patient is well appearing, alert and oriented x 3 today.   Head- normocephalic, atraumatic Eyes-  Sclera clear, conjunctiva pink Ears- hearing intact Oropharynx- clear Neck- supple, no JVP Lymph- no cervical lymphadenopathy Lungs- Clear to ausculation bilaterally, normal work of breathing Heart- Regular rate and rhythm, no murmurs, rubs or gallops, PMI not laterally displaced GI- soft, NT, ND, + BS Extremities- no clubbing, cyanosis, or edema   LABS: Basic Metabolic Panel:  Basename 10/15/12 0450 10/14/12 0612  NA 132* 134*  K 4.2 4.1  CL 100 99  CO2 22 22    GLUCOSE 235* 228*  BUN 37* 33*  CREATININE 1.26* 1.56*  CALCIUM 8.9 8.7  MG -- --  PHOS -- --   Liver Function Tests: No results found for this basename: AST:2,ALT:2,ALKPHOS:2,BILITOT:2,PROT:2,ALBUMIN:2 in the last 72 hours No results found for this basename: LIPASE:2,AMYLASE:2 in the last 72 hours CBC:  Basename 10/13/12 0500  WBC 9.1  NEUTROABS --  HGB 10.4*  HCT 31.1*  MCV 89.1  PLT 112*    ASSESSMENT AND PLAN:  Active Problems:  Acute renal insufficiency  1. SVT- recurrent SVT s/p ablation x 2.  PR is prolonged.  I think that any additional ablation would lead to AV block. I agree with Dr Lovena Le that medical management is reasonable. Decrease amiodarone to 200mg  daily Decrease atenolol to 25mg  daily. Would avoid PPM at this point.  If she has worsening AV block or symptoms of arrhythmia then we would likely proceed with PPM at that time.  2. 1st degree AV block As above   No further inpatient EP workup at this point. Would DC to home on atenolol 25mg  daily and amiodarone 200mg  daily once other medical issues are stable Follow-up with me in 4 weeks.  Call with questions.  Thompson Grayer, MD 10/15/2012 7:59 AM

## 2012-10-15 NOTE — Clinical Documentation Improvement (Addendum)
RENAL FAILURE DOCUMENTATION CLARIFICATION QUERY  THIS DOCUMENT IS NOT A PERMANENT PART OF THE MEDICAL RECORD  Please update your documentation within the medical record to reflect your response to this query.                                                                                     10/15/12  Dr. Radford Pax and/or Associates,  In a better effort to capture your patient's severity of illness, reflect appropriate length of stay and utilization of resources, a review of the patient medical record has revealed the following indicators:  "Acute renal insufficiency secondary to diuretics - will hold lasix for now" Dr. Radford Pax Progress Note   10/14/12   BUN/Cr/GFR   (black female)   This admission 27/0.94/66        10/10/12 23/0.86/74        10/11/12 22/0.95/65        10/12/12 25/1.25/47        10/13/12 33/1.56/36        10/14/12 27/1.26/46        10/15/12  Creat. up 0.3 x 2 days in a row - 12/20 to 10/13/12 and 12/21 to 10/14/12  Repeat Chemistries ordered.   Based on your clinical judgment, please document in the progress notes and discharge summary if a condition below provides greater specificity regarding the documented "Acute renal insufficiency secondary to diuretics":   - Acute Kidney Injury 2/2 Diuretics   - Acute Renal Failure 2/2 Diuretics   - Other Condition   - Unable to Clinically Determine   In responding to this query please exercise your independent judgment.    The fact that a query is asked, does not imply that any particular answer is desired or expected.    Reviewed: additional documentation in the medical record  Thank You,  Erling Conte  RN BSN CCDS Certified Clinical Documentation Specialist: Cell   [phone]  Health Information Management [place]:  1. If needed, update documentation for the patient's encounter via the notes activity.  2. Access this query again  and click edit on the In [place].  3. After updating, or not, click F2 to complete all highlighted (required) fields concerning your review. Select "additional documentation in the medical record" OR "no additional documentation provided".  4. Click Sign note button.  5. The deficiency will fall out of your In Basket *Please let us know if you are not able to complete this workflow by phone or e-mail (listed below).

## 2012-10-15 NOTE — Progress Notes (Addendum)
SUBJECTIVE:  Feels weak.  Wants to walk with PT.  OBJECTIVE:   Vitals:   Filed Vitals:   10/14/12 2034 10/15/12 0531 10/15/12 1110 10/15/12 1334  BP: 102/62 108/66 119/61 102/50  Pulse: 74 77 66 66  Temp: 98.3 F (36.8 C) 98.3 F (36.8 C)  98.2 F (36.8 C)  TempSrc: Oral Oral  Oral  Resp: 20 20 20 19   Height:      Weight:  87.6 kg (193 lb 2 oz)    SpO2: 98% 97% 98% 98%   I&O's:    Intake/Output Summary (Last 24 hours) at 10/15/12 1747 Last data filed at 10/15/12 1302  Gross per 24 hour  Intake    780 ml  Output    300 ml  Net    480 ml   TELEMETRY: Reviewed telemetry pt in NSR  PHYSICAL EXAM General: Well developed, well nourished, in no acute distress Head: Eyes PERRLA,   Lungs:   Clear bilaterally to auscultation and percussion. Heart:   HRRR S1 S2 Pulses are 2+ & equal. Abdomen: Bowel sounds are positive, abdomen soft and non-tender without masses Extremities:   No clubbing, cyanosis or edema.  DP +1 Neuro: Alert and oriented X 3. Psych:  Normal affect, responds appropriately   LABS: Basic Metabolic Panel:  Basename 10/15/12 0450 10/14/12 0612  NA 132* 134*  K 4.2 4.1  CL 100 99  CO2 22 22  GLUCOSE 235* 228*  BUN 37* 33*  CREATININE 1.26* 1.56*  CALCIUM 8.9 8.7  MG -- --  PHOS -- --   Liver Function Tests: No results found for this basename: AST:2,ALT:2,ALKPHOS:2,BILITOT:2,PROT:2,ALBUMIN:2 in the last 72 hours No results found for this basename: LIPASE:2,AMYLASE:2 in the last 72 hours CBC:  Basename 10/13/12 0500  WBC 9.1  NEUTROABS --  HGB 10.4*  HCT 31.1*  MCV 89.1  PLT 112*   Coag Panel:   Lab Results  Component Value Date   INR 3.83* 10/15/2012   INR 2.76* 10/14/2012   INR 2.05* 10/13/2012    RADIOLOGY: Dg Chest Portable 1 View  10/10/2012  *RADIOLOGY REPORT*  Clinical Data: Chest pain, shortness of breath, on medication for hypertension  PORTABLE CHEST - 1 VIEW  Comparison: Chest x-ray of 01/12/2012  Findings: No active infiltrate  or effusion is seen.  Very minimal patchy opacity is noted in the left upper lung field probably representing atelectasis but if symptoms persist or worsen follow- up chest x-ray may be warranted.  The heart is mildly enlarged. There is mild thoracic curvature convex to the right with degenerative change.  IMPRESSION:  1.  Minimal opacity in the left upper lung field possibly due to atelectasis or scarring.  Consider follow-up if clinically warranted. 2. Mild cardiomegaly   Original Report Authenticated By: Ivar Drape, M.D.     ASSESSMENT:  1. SVT s/p redo ablation now on Amiodarone  2. ? Afib now vs. NSR with very frequent PAC's or short bursts of SVT on Amiodarone- may need pacer due to tachybrady, with possible  AV node ablation. 3. LBBB  4. DVT with therapeutic INR on Coumadin  5. DM  6. Diastolic dysfunction with volume overload - lungs clear today with good urine output 3.5L - given bump in creatinine and borderline low BP will hold Lasix for now -  7. Hypokalemia - resolved 8.  Acute renal insufficiency secondary to diuretics -Cr. Better today. will hold lasix for now. restart in AM PLAN:  1. Continue Amio   2. Coumadin  per pharmacy  3.  Appreciate recs per EP , Amio 200 mg daily and atenolol 25 mg daily 4.  Hold lasix  5.  BMET in am    Jettie Booze., MD  10/15/2012  5:47 PM

## 2012-10-16 LAB — BASIC METABOLIC PANEL
CO2: 21 mEq/L (ref 19–32)
Calcium: 8.8 mg/dL (ref 8.4–10.5)
Creatinine, Ser: 1.08 mg/dL (ref 0.50–1.10)
Glucose, Bld: 221 mg/dL — ABNORMAL HIGH (ref 70–99)

## 2012-10-16 LAB — CBC
Hemoglobin: 9.2 g/dL — ABNORMAL LOW (ref 12.0–15.0)
MCH: 30 pg (ref 26.0–34.0)
MCV: 90.2 fL (ref 78.0–100.0)
RBC: 3.07 MIL/uL — ABNORMAL LOW (ref 3.87–5.11)

## 2012-10-16 LAB — GLUCOSE, CAPILLARY: Glucose-Capillary: 271 mg/dL — ABNORMAL HIGH (ref 70–99)

## 2012-10-16 MED ORDER — INSULIN GLARGINE 100 UNIT/ML ~~LOC~~ SOLN: 30.0000 [IU] | Freq: Every day | SUBCUTANEOUS | Status: DC

## 2012-10-16 MED ORDER — WARFARIN SODIUM 2.5 MG PO TABS: 2.5000 mg | ORAL_TABLET | Freq: Every day | ORAL | Status: DC

## 2012-10-16 MED ORDER — WARFARIN SODIUM 10 MG PO TABS: 10.0000 mg | ORAL_TABLET | Freq: Every day | ORAL | Status: DC

## 2012-10-16 MED ORDER — INSULIN ASPART 100 UNIT/ML ~~LOC~~ SOLN
6.0000 [IU] | Freq: Three times a day (TID) | SUBCUTANEOUS | Status: DC
  Administered 2012-10-16: 6 [IU] via SUBCUTANEOUS

## 2012-10-16 MED ORDER — FUROSEMIDE 40 MG PO TABS: 40.0000 mg | ORAL_TABLET | Freq: Every day | ORAL | Status: DC

## 2012-10-16 MED ORDER — ATENOLOL 25 MG PO TABS: 25.0000 mg | ORAL_TABLET | Freq: Every day | ORAL | Status: DC

## 2012-10-16 MED ORDER — INSULIN GLARGINE 100 UNIT/ML ~~LOC~~ SOLN
30.0000 [IU] | Freq: Every day | SUBCUTANEOUS | Status: DC
  Administered 2012-10-16: 30 [IU] via SUBCUTANEOUS

## 2012-10-16 MED ORDER — AMIODARONE HCL 200 MG PO TABS: 200.0000 mg | ORAL_TABLET | Freq: Every day | ORAL | Status: DC

## 2012-10-16 NOTE — Discharge Summary (Addendum)
Patient ID: [name] MRN: [id number] DOB/AGE: Mar 01, 1933 78 y.o.  Admit date: 10/10/2012 Discharge date: 10/16/2012  Primary Discharge Diagnosis Supraventricular tachycardia Secondary Discharge Diagnosis Diastolicheart failure, LBBB,   Significant Diagnostic Studies: EPS with ablation  Consults: electrophysiology  Hospital Course: 78 y/o who came to the office with palpitations and presyncope.  ECG in the office revealed paroxysmal SVT which correlated to lightheadedness.  She was admitted to the hospital and was found to have bradycardia as well which limited medication.  She had an EP study and ablation but this did not eliminate the SVT completely.  Dr. Lovena Le started the patient on Amiodarone and decreased the atenolol dose.  Heart rhythm was better controlled.  Symptoms were better controlled.  She may eventually need a pacer, but Dr. Rayann Heman would like to try this medicine combination first   PT walked with the patient and they recommended SNF placement.  Of note, blood sugars have been an issue for some time.  We increased her morning Lantus dose at the time of d/c.  Part of the issue was that she was NPO in the hospital, but HbA1C is >9, so this is a long term issue.  She will need to f/u with [name] regarding this.   INR was elevated at discharge. Hold coumadin for 2 days and then resume.  F/u with Alferd Apa for coumadin monitoring. [id number].  Chronic anemia.  Will need f/u CBC.  COntinue iron.  ARF after IV lasix for diastolic dysfunction. Resolved with less diuresis.  Restart daily Lasix.   Discharge Exam: Blood pressure 112/54, pulse 66, temperature 98.4 F (36.9 C), temperature source Oral, resp. rate 19, height 5\' 5"  (1.651 m), weight 87.5 kg (192 lb 14.4 oz), SpO2 99.00%.  [place]/AT RRR S1S2 CTA bilaterally Trace edema Labs:   Lab Results  Component Value Date   WBC 5.2 10/16/2012   HGB 9.2* 10/16/2012   HCT 27.7* 10/16/2012   MCV 90.2 10/16/2012   PLT 118* 10/16/2012    Lab 10/16/12 0540  NA 135  K 4.1  CL 103  CO2 21  BUN 30*  CREATININE 1.08  CALCIUM 8.8  PROT --  BILITOT --  ALKPHOS --  ALT --  AST --  GLUCOSE 221*   Lab Results  Component Value Date   CKTOTAL 263* 10/11/2012   CKMB 2.0 10/11/2012   TROPONINI <0.30 10/11/2012    Lab Results  Component Value Date   CHOL  Value: 202        ATP III CLASSIFICATION:  <200     mg/dL   Desirable  200-239  mg/dL   Borderline High  >=240    mg/dL   High       * 12/11/2008   Lab Results  Component Value Date   HDL 78 12/11/2008   Lab Results  Component Value Date   LDLCALC  Value: 111        Total Cholesterol/HDL:CHD Risk Coronary Heart Disease Risk Table                     Men   Women  1/2 Average Risk   3.4   3.3  Average Risk       5.0   4.4  2 X Average Risk   9.6   7.1  3 X Average Risk  23.4   11.0        Use the calculated Patient Ratio above and the CHD Risk Table to determine the  patient's CHD Risk.        ATP III CLASSIFICATION (LDL):  <100     mg/dL   Optimal  100-129  mg/dL   Near or Above                    Optimal  130-159  mg/dL   Borderline  160-189  mg/dL   High  >190     mg/dL   Very High* 12/11/2008   Lab Results  Component Value Date   TRIG 64 12/11/2008   Lab Results  Component Value Date   CHOLHDL 2.6 12/11/2008   No results found for this basename: LDLDIRECT      Radiology: Mild cardiomegaly EKG: junctional rhythm with retrograde P waves, LBBB  FOLLOW UP PLANS AND APPOINTMENTS    Medication List     As of 10/16/2012 10:52 AM    TAKE these medications         amiodarone 200 MG tablet   Commonly known as: PACERONE   Take 1 tablet (200 mg total) by mouth daily.      atenolol 25 MG tablet   Commonly known as: TENORMIN   Take 1 tablet (25 mg total) by mouth daily.      atorvastatin 20 MG tablet   Commonly known as: LIPITOR   Take 20 mg by mouth daily.      calcium carbonate 600 MG Tabs   Commonly known as: OS-CAL   Take 600 mg by  mouth 2 (two) times daily with a meal.      Colesevelam HCl 3.75 G Pack   Take 3.75 g by mouth daily.      esomeprazole 40 MG capsule   Commonly known as: NEXIUM   Take 40 mg by mouth daily before breakfast.      felodipine 10 MG 24 hr tablet   Commonly known as: PLENDIL   Take 10 mg by mouth daily.      ferrous gluconate 324 MG tablet   Commonly known as: FERGON   Take 324 mg by mouth daily with breakfast.      furosemide 40 MG tablet   Commonly known as: LASIX   Take 1 tablet (40 mg total) by mouth daily.      insulin aspart 100 UNIT/ML injection   Commonly known as: novoLOG   Inject 1-9 Units into the skin 3 (three) times daily before meals. Sliding scale      insulin glargine 100 UNIT/ML injection   Commonly known as: LANTUS   Inject 30 Units into the skin daily.      lisinopril 20 MG tablet   Commonly known as: PRINIVIL,ZESTRIL   Take 20 mg by mouth daily.      magnesium oxide 400 MG tablet   Commonly known as: MAG-OX   Take 400 mg by mouth daily.      oxybutynin 5 MG tablet   Commonly known as: DITROPAN   Take 5 mg by mouth 2 (two) times daily.      potassium chloride 10 MEQ tablet   Commonly known as: K-DUR   Take 20 mEq by mouth daily.      Vitamin D (Ergocalciferol) 50000 UNITS Caps   Commonly known as: DRISDOL   Take 50,000 Units by mouth 2 (two) times a week.      warfarin 10 MG tablet   Commonly known as: COUMADIN   Take 1 tablet (10 mg total) by mouth daily. Takes 5 mg along with a  10 mg tablet in Monday, Wednesday and Friday, then takes 2.5 mg along with a 10 mg all other days   Start taking on: 10/18/2012      warfarin 2.5 MG tablet   Commonly known as: COUMADIN   Take 1-2 tablets (2.5-5 mg total) by mouth daily. Takes 5 mg along with a 10 mg tablet in Monday, Wednesday and Friday, then takes 2.5 mg along with a 10 mg all other days   Start taking on: 10/18/2012           Follow-up Information    Follow up with Jettie Booze., MD.  In 2 weeks. (arrhytmia)    Contact information:   [address] [place] [address] [phone]       Follow up with Maximino Greenland, MD. In 1 week. (diabetes)    Contact information:   [place] [address] [phone]          BRING ALL MEDICATIONS WITH YOU TO FOLLOW UP APPOINTMENTS  Time spent with patient to include physician time: 40 minutes, going over plan and discussing diabetes options and discharge options.  SignedJettie Booze. 10/16/2012, 10:52 AM

## 2012-10-16 NOTE — Progress Notes (Signed)
Pt DC to [facility] skilled nursing facility via family and wc.  Report called to receiving nurse.

## 2012-10-16 NOTE — Progress Notes (Signed)
Physical Therapy Treatment Patient Details Name: [name] MRN: [id number] DOB: October 09, 1933 Today's Date: 10/16/2012 Time: [id number] PT Time Calculation (min): 24 min  PT Assessment / Plan / Recommendation Comments on Treatment Session  Pt admitted with presyncope, SVT s/p ablation and progressing well with mobility today. Pt reports she continues to have soreness of RLE knee with gait and HEP but encouraged to continue mobility as able. Will continue to follow.                                  Frequency     Plan Discharge plan remains appropriate;Frequency remains appropriate    Precautions / Restrictions Precautions Precautions: Fall   Pertinent Vitals/Pain Soreness grossly 3/10 Rknee with mobility    Mobility  Bed Mobility Bed Mobility: Not assessed Details for Bed Mobility Assistance: Pt in recliner before and after session Transfers Transfers: Sit to Stand;Stand to Sit Sit to Stand: 5: Supervision;From chair/3-in-1;From toilet Stand to Sit: 5: Supervision;To toilet;To chair/3-in-1 Details for Transfer Assistance: cueing for hand placement and safety with cueing to increase control of descent to lower surface of chair Ambulation/Gait Ambulation/Gait Assistance: 4: Min assist Ambulation Distance (Feet): 80 Feet (80' then 15') Assistive device: Rolling walker Ambulation/Gait Assistance Details: cueing to step into RW, extend trunk and for directional cues. 2 standing rest breaks during ambulation Gait Pattern: Step-through pattern;Decreased stride length;Trunk flexed Gait velocity: decreased Stairs: No    Exercises General Exercises - Lower Extremity Long Arc Quad: AROM;Both;15 reps;Seated Hip Flexion/Marching: AROM;Right;Left;10 reps;15 reps;Seated (10x on RLE, 15x on LLe)   PT Diagnosis:    PT Problem List:   PT Treatment Interventions:     PT Goals Acute Rehab PT Goals Pt will go Sit to Stand: with modified independence PT Goal: Sit to Stand - Progress:  Updated due to goal met Pt will Transfer Bed to Chair/Chair to Bed: with modified independence PT Transfer Goal: Bed to Chair/Chair to Bed - Progress: Updated due to goal met Pt will Ambulate: 51 - 150 feet;with supervision;with least restrictive assistive device PT Goal: Ambulate - Progress: Updated due to goal met  Visit Information  Last PT Received On: 10/16/12 Assistance Needed: +1    Subjective Data  Subjective: My right knee is still sore   Cognition  Overall Cognitive Status: Appears within functional limits for tasks assessed/performed Arousal/Alertness: Awake/alert Orientation Level: Appears intact for tasks assessed Behavior During Session: [facility] for tasks performed    Balance     End of Session PT - End of Session Equipment Utilized During Treatment: Gait belt Activity Tolerance: Patient tolerated treatment well Patient left: in chair;with call bell/phone within reach;with family/visitor present Nurse Communication: Mobility status   GP     Melford Aase 10/16/2012, 12:20 PM Elwyn Reach, [place]

## 2012-10-16 NOTE — Clinical Social Work Placement (Addendum)
    Clinical Social Work Department CLINICAL SOCIAL WORK PLACEMENT NOTE 10/16/2012  Patient:  [name], [name]  Account Number:  [id number] Admit date:  10/10/2012  Clinical Social Worker:  Butch Penny Clifton Kovacic, LCSWA  Date/time:  10/15/2012 05:00 PM  Clinical Social Work is seeking post-discharge placement for this patient at the following level of care:   SKILLED NURSING   (*CSW will update this form in Epic as items are completed)   10/15/2012  Patient/family provided with [facility] Department of Clinical Social Work's list of facilities offering this level of care within the geographic area requested by the patient (or if unable, by the patient's family).  10/15/2012  Patient/family informed of their freedom to choose among providers that offer the needed level of care, that participate in Medicare, Medicaid or managed care program needed by the patient, have an available bed and are willing to accept the patient.  10/15/2012  Patient/family informed of [facility]' ownership interest in [facility], as well as of the fact that they are under no obligation to receive care at this facility.  PASARR submitted to EDS on 10/16/12 PASARR number received from EDS on 10/16/12  FL2 transmitted to all facilities in geographic area requested by pt/family on  10/15/2012 FL2 transmitted to all facilities within larger geographic area on   Patient informed that his/her managed care company has contracts with or will negotiate with  certain facilities, including the following:   [facility]- Medicare Complete     Patient/family informed of bed offers received:  10/16/12 Patient chooses bed at [facility] Physician recommends and patient chooses bed at    Patient to be transferred to [facility]  on  10/16/12 Patient to be transferred to facility by  Car  (with daughter The following physician request were entered in Epic:   Additional Comments:OK per MD for d/c today to SNF. Patient and  daughter chose [facility] and bed is available per SNF. Transport via car per pt/daughter's request.  Notified pt's nurse- Tiffany of d/c.  No further CSW needs identified.  CSW signing off.  Lorie Phenix. [place], [place]

## 2012-10-16 NOTE — Progress Notes (Signed)
Inpatient Diabetes Program Recommendations  AACE/ADA: New Consensus Statement on Inpatient Glycemic Control (2013)  Target Ranges:  Prepandial:   less than 140 mg/dL      Peak postprandial:   less than 180 mg/dL (1-2 hours)      Critically ill patients:  140 - 180 mg/dL   Reason for Visit: Consult for management of diabetes  Inpatient Diabetes Program Recommendations Insulin - Basal: Pt takes at home: 26 units lantus in am and 10 units at HS. Please increase to 35 units here to start. Insulin - Meal Coverage: xxx Oral Agents: May want to consider addition of Januvia to med regimen.  This is a DPP-4 inhibitor.  Start with 25 mg/day. [facility] formulary uses Tradjenta 5 mg for its formulary))  Note: This class of oral agents helps to control the post-prandial glucose excursions without fear of hypogycemia. Thank you, Rosita Kea, RN, CNS, Diabetes Coordinator [phone])

## 2012-10-16 NOTE — Progress Notes (Signed)
SUBJECTIVE:  Awaiting rehab.  Sugars increased.  OBJECTIVE:   Vitals:   Filed Vitals:   10/15/12 1110 10/15/12 1334 10/15/12 2102 10/16/12 0538  BP: 119/61 102/50 105/55 110/60  Pulse: 66 66 79 66  Temp:  98.2 F (36.8 C) 100 F (37.8 C) 98.4 F (36.9 C)  TempSrc:  Oral Oral Oral  Resp: 20 19    Height:      Weight:    87.5 kg (192 lb 14.4 oz)  SpO2: 98% 98% 98% 99%   I&O's:    Intake/Output Summary (Last 24 hours) at 10/16/12 [id number] Last data filed at 10/16/12 [id number]  Gross per 24 hour  Intake    960 ml  Output      0 ml  Net    960 ml   TELEMETRY: Reviewed telemetry pt in NSR  PHYSICAL EXAM General: Well developed, well nourished, in no acute distress Head: Eyes PERRLA,   Lungs:   Clear bilaterally to auscultation and percussion. Heart:   HRRR S1 S2 Pulses are 2+ & equal. Abdomen: Bowel sounds are positive, abdomen soft and non-tender without masses Extremities:   No clubbing, cyanosis or edema.  DP +1 Neuro: Alert and oriented X 3. Psych:  Normal affect, responds appropriately   LABS: Basic Metabolic Panel:  Basename 10/16/12 0540 10/15/12 0450  NA 135 132*  K 4.1 4.2  CL 103 100  CO2 21 22  GLUCOSE 221* 235*  BUN 30* 37*  CREATININE 1.08 1.26*  CALCIUM 8.8 8.9  MG -- --  PHOS -- --   Liver Function Tests: No results found for this basename: AST:2,ALT:2,ALKPHOS:2,BILITOT:2,PROT:2,ALBUMIN:2 in the last 72 hours No results found for this basename: LIPASE:2,AMYLASE:2 in the last 72 hours CBC:  Basename 10/16/12 0540  WBC 5.2  NEUTROABS --  HGB 9.2*  HCT 27.7*  MCV 90.2  PLT 118*   Coag Panel:   Lab Results  Component Value Date   INR 3.42* 10/16/2012   INR 3.83* 10/15/2012   INR 2.76* 10/14/2012    RADIOLOGY: Dg Chest Portable 1 View  10/10/2012  *RADIOLOGY REPORT*  Clinical Data: Chest pain, shortness of breath, on medication for hypertension  PORTABLE CHEST - 1 VIEW  Comparison: Chest x-ray of 01/12/2012  Findings: No active infiltrate or  effusion is seen.  Very minimal patchy opacity is noted in the left upper lung field probably representing atelectasis but if symptoms persist or worsen follow- up chest x-ray may be warranted.  The heart is mildly enlarged. There is mild thoracic curvature convex to the right with degenerative change.  IMPRESSION:  1.  Minimal opacity in the left upper lung field possibly due to atelectasis or scarring.  Consider follow-up if clinically warranted. 2. Mild cardiomegaly   Original Report Authenticated By: Ivar Drape, M.D.     ASSESSMENT:  1. SVT s/p redo ablation now on Amiodarone  2. ? Afib now vs. NSR with very frequent PAC's or short bursts of SVT on Amiodarone- may need pacer due to tachybrady, with possible  AV node ablation. 3. LBBB  4. DVT with therapeutic INR on Coumadin  5. DM  6. Diastolic dysfunction with volume overload - lungs clear today  7. Hypokalemia - resolved 8.  Acute renal insufficiency secondary to diuretics -Cr. Better today.  PLAN:  1. Continue Amio   2. Coumadin per pharmacy  3.  Appreciate recs per EP , Amio 200 mg daily and atenolol 25 mg daily 4.  Restarted low dose lasix.   DM.  Increased lantus to 30 U daily.  Added meal coverage as well per DM coordinator recs. Awaiting SNF placement.  OK for placement from a medical standpoint when bed available.    Jettie Booze., MD  10/16/2012  9:17 AM

## 2012-10-16 NOTE — Clinical Social Work Psychosocial (Addendum)
    Clinical Social Work Department BRIEF PSYCHOSOCIAL ASSESSMENT 10/16/2012  Patient:  [name], [name]     Account Number:  [id number]     Admit date:  10/10/2012  Clinical Social Worker:  Iona Coach  Date/Time:  10/15/2012 05:00 PM  Referred by:  Physician  Date Referred:  10/15/2012 Referred for  SNF Placement   Other Referral:   Interview type:  Patient Other interview type:   message left for dgt [name] to call CSW    PSYCHOSOCIAL DATA Living Status:  FAMILY Admitted from facility:   Level of care:   Primary support name:  [name] Primary support relationship to patient:  CHILD, ADULT [name] of support available:   Strong support  Patient's granddaughter lives with patient but is not available during the day.    CURRENT CONCERNS Current Concerns  Post-Acute Placement   Other Concerns:    SOCIAL WORK ASSESSMENT / PLAN Patient had orginally planned to go home and stated that she has a 2 1/2 hour sitter at home; her granddaughter lives with her but is not there during the day. Her daughter lives closeby but she drives a school bus and is a Academic librarian as well.  Patient still hoped to go stay with her daughter but PT felt strongly that patient would benefit from short term SNF for rehab. After talking with patient- she changed her mind and agreed to SNF.  FL2 initiated and placed on chart for MD's signature.  Active bed search initated; SNF list left wiht patient and she will discuss with her daughter [name] this evening.   Assessment/plan status:  Psychosocial Support/Ongoing Assessment of Needs Other assessment/ plan:   Information/referral to community resources:   SNF bed list provided  Aftercare needs discussed- HH/DME to be arranged by SNF at d/c.    PATIENT'S/FAMILY'S RESPONSE TO PLAN OF CARE: Patient is now agreeable to short term SNF. She is unsure of bed preference and will talk to her daughter this evening. Patient was  hopint to go home but is ok with d/c to short term SNf.  Will discuss further with patient and daughter in the a.m.

## 2012-10-16 NOTE — Progress Notes (Signed)
ANTICOAGULATION CONSULT NOTE - Follow Up Consult  Pharmacy Consult for Coumadin Indication: hx DVT  Allergies  Allergen Reactions  . Aspirin   . Penicillins    Patient Measurements: Height: 5\' 5"  (165.1 cm) Weight: 192 lb 14.4 oz (87.5 kg) IBW/kg (Calculated) : 57   Vital Signs: Temp: 98.4 F (36.9 C) (12/24 0538) Temp src: Oral (12/24 0538) BP: 110/60 mmHg (12/24 0538) Pulse Rate: 66  (12/24 0538)  Labs:  Basename 10/16/12 0540 10/15/12 0450 10/14/12 0612  HGB 9.2* -- --  HCT 27.7* -- --  PLT 118* -- --  APTT -- -- --  LABPROT 32.6* 35.4* 27.8*  INR 3.42* 3.83* 2.76*  HEPARINUNFRC -- -- --  CREATININE 1.08 1.26* 1.56*  CKTOTAL -- -- --  CKMB -- -- --  TROPONINI -- -- --    Estimated Creatinine Clearance: 47.7 ml/min (by C-G formula based on Cr of 1.08).  Assessment: 79yof continues on coumadin for hx DVT. INR remains supratherapeutic but decreased from yesterday after holding last night's dose. Amiodarone dose decreased yesterday as well. No bleeding reported. CBC low - will watch.   Goal of Therapy:  INR 2-3 Monitor platelets by anticoagulation protocol: Yes   Plan:  1) No coumadin tonight 2) Follow up INR in AM  Deboraha Sprang 10/16/2012,9:17 AM

## 2012-11-19 ENCOUNTER — Emergency Department (HOSPITAL_COMMUNITY): Payer: Medicare Other

## 2012-11-19 ENCOUNTER — Emergency Department (HOSPITAL_COMMUNITY)
Admission: EM | Admit: 2012-11-19 | Discharge: 2012-11-19 | Disposition: A | Discharge status: Home / Self Care | Payer: Medicare Other | Attending: Emergency Medicine | Admitting: Emergency Medicine

## 2012-11-19 ENCOUNTER — Encounter (HOSPITAL_COMMUNITY): Payer: Self-pay | Admitting: Emergency Medicine

## 2012-11-19 DIAGNOSIS — Z862 Personal history of diseases of the blood and blood-forming organs and certain disorders involving the immune mechanism: Secondary | ICD-10-CM | POA: Insufficient documentation

## 2012-11-19 DIAGNOSIS — Z8669 Personal history of other diseases of the nervous system and sense organs: Secondary | ICD-10-CM | POA: Insufficient documentation

## 2012-11-19 DIAGNOSIS — Z7901 Long term (current) use of anticoagulants: Secondary | ICD-10-CM | POA: Insufficient documentation

## 2012-11-19 DIAGNOSIS — M25569 Pain in unspecified knee: Secondary | ICD-10-CM

## 2012-11-19 DIAGNOSIS — Z86718 Personal history of other venous thrombosis and embolism: Secondary | ICD-10-CM | POA: Insufficient documentation

## 2012-11-19 DIAGNOSIS — I5031 Acute diastolic (congestive) heart failure: Secondary | ICD-10-CM | POA: Insufficient documentation

## 2012-11-19 DIAGNOSIS — Z794 Long term (current) use of insulin: Secondary | ICD-10-CM | POA: Insufficient documentation

## 2012-11-19 DIAGNOSIS — Z79899 Other long term (current) drug therapy: Secondary | ICD-10-CM | POA: Insufficient documentation

## 2012-11-19 DIAGNOSIS — Z8739 Personal history of other diseases of the musculoskeletal system and connective tissue: Secondary | ICD-10-CM | POA: Insufficient documentation

## 2012-11-19 MED ORDER — HYDROCODONE-ACETAMINOPHEN 5-325 MG PO TABS
1.0000 | ORAL_TABLET | Freq: Once | ORAL | Status: AC
  Administered 2012-11-19: 1 via ORAL
  Filled 2012-11-19: qty 1

## 2012-11-19 MED ORDER — HYDROCODONE-ACETAMINOPHEN 5-325 MG PO TABS
1.0000 | ORAL_TABLET | ORAL | Status: DC | PRN
Start: 2012-11-19 — End: 2013-08-06

## 2012-11-19 NOTE — ED Notes (Signed)
Pharmacy tech at bedside 

## 2012-11-19 NOTE — ED Notes (Signed)
States no injury just moved it funny in bed left leg swollen

## 2012-11-19 NOTE — ED Notes (Addendum)
Pt c/o left knee pain started 3am this morning, pt reports she was dreaming that she was running then all of a sudden she started to have a sharp pain in left knee. Pt reports she is unable to put pressure on bend left knee d/t pain. Pt has slight swelling to left lateral knee. Pt has bilateral lower extremity 2+ pitting edema.

## 2012-11-19 NOTE — ED Notes (Signed)
Paged ortho 

## 2012-11-19 NOTE — ED Provider Notes (Signed)
History     CSN: [id number]  Arrival date & time 11/19/12  1228   First MD Initiated Contact with Patient 11/19/12 1351      Chief Complaint  Patient presents with  . Knee Pain    (Consider location/radiation/quality/duration/timing/severity/associated sxs/prior treatment) Patient is a 79 y.o. female presenting with knee pain. The history is provided by the patient and a caregiver.  Knee Pain This is a recurrent problem. Associated symptoms include arthralgias. Pertinent negatives include no chills, fever or numbness. Associated symptoms comments: Sent by home health nurse for evaluation of left knee pain and swelling. She has a history of arthritis in both knees, right greater than left, followed by Dr. Mina Marble. Last arthrocentesis was December, 2013. She feels she may have twisted her knee in the last couple of days and has increased pain and swelling. No fall. No redness or fever. . The symptoms are aggravated by bending and walking.    Past Medical History  Diagnosis Date  . Thrombocytopenia 11/21/2011  . DVT (deep venous thrombosis)   . Dizziness and giddiness   . Acute diastolic heart failure     History reviewed. No pertinent past surgical history.  No family history on file.  History  Substance Use Topics  . Smoking status: Never Smoker   . Smokeless tobacco: Never Used  . Alcohol Use: No    OB History    Grav Para Term Preterm Abortions TAB SAB Ect Mult Living                  Review of Systems  Constitutional: Negative for fever and chills.  Respiratory: Negative.  Negative for shortness of breath.   Cardiovascular: Negative.   Musculoskeletal: Positive for arthralgias.       See HPI.  Skin: Negative.  Negative for color change.  Neurological: Negative.  Negative for numbness.    Allergies  Aspirin and Penicillins  Home Medications   Current Outpatient Rx  Name  Route  Sig  Dispense  Refill  . AMIODARONE HCL 200 MG PO TABS   Oral   Take 1 tablet  (200 mg total) by mouth daily.   30 tablet   11   . ATENOLOL 50 MG PO TABS   Oral   Take 25 mg by mouth daily.         . ATORVASTATIN CALCIUM 20 MG PO TABS   Oral   Take 20 mg by mouth daily.         [name] CALCIUM CARBONATE 600 MG PO TABS   Oral   Take 600 mg by mouth 2 (two) times daily with a meal.         . COLESEVELAM HCL 3.75 G PO PACK   Oral   Take 3.75 g by mouth daily.         [name] ESOMEPRAZOLE MAGNESIUM 40 MG PO CPDR   Oral   Take 40 mg by mouth daily before breakfast.         . FELODIPINE ER 10 MG PO TB24   Oral   Take 10 mg by mouth daily.         [name] FERROUS GLUCONATE 324 (38 FE) MG PO TABS   Oral   Take 324 mg by mouth daily with breakfast.         . FUROSEMIDE 40 MG PO TABS   Oral   Take 1 tablet (40 mg total) by mouth daily.   30 tablet   11   .  INSULIN ASPART 100 UNIT/ML [place] SOLN   Subcutaneous   Inject 6 Units into the skin 3 (three) times daily before meals. Sliding scale.  Patient doesn't have the information with her right now pertaining to the sliding scale.         . INSULIN GLARGINE 100 UNIT/ML [place] SOLN   Subcutaneous   Inject 34 Units into the skin daily.         [name] LISINOPRIL 20 MG PO TABS   Oral   Take 20 mg by mouth daily.         [name] MAGNESIUM OXIDE 400 MG PO TABS   Oral   Take 400 mg by mouth daily.         . OXYBUTYNIN CHLORIDE 5 MG PO TABS   Oral   Take 5 mg by mouth 2 (two) times daily.         [name] POTASSIUM CHLORIDE ER 10 MEQ PO TBCR   Oral   Take 20 mEq by mouth daily.         . TRAMADOL HCL 50 MG PO TABS   Oral   Take 50 mg by mouth every 6 (six) hours as needed. For pain.         . WARFARIN SODIUM 10 MG PO TABS   Oral   Take 1 tablet (10 mg total) by mouth daily. Takes 5 mg along with a 10 mg tablet in Monday, Wednesday and Friday, then takes 2.5 mg along with a 10 mg all other days         . WARFARIN SODIUM 10 MG PO TABS   Oral   Take 10 mg by mouth daily.         [name] VITAMIN D  (ERGOCALCIFEROL) 50000 UNITS PO CAPS   Oral   Take 50,000 Units by mouth 2 (two) times a week.           BP 117/55  Pulse 65  Temp 98.3 F (36.8 C) (Oral)  Resp 18  SpO2 100%  Physical Exam  Constitutional: She is oriented to person, place, and time. She appears well-developed and well-nourished.  Neck: Normal range of motion.  Cardiovascular:       Pulses distally are 2+.  Pulmonary/Chest: Effort normal.  Musculoskeletal:       Left knee moderately swollen without redness or warmth. FROM, joint stable. Tender to superior anterolateral aspect.   Neurological: She is alert and oriented to person, place, and time.  Skin: Skin is warm and dry.    ED Course  Procedures (including critical care time)  Labs Reviewed - No data to display Dg Knee Complete 4 Views Left  11/19/2012  *RADIOLOGY REPORT*  Clinical Data: Pain and swelling.  LEFT KNEE - COMPLETE 4+ VIEW  Comparison: None  Findings: There are tricompartmental degenerative changes with joint space narrowing and osteophytic spurring.  No acute fracture or osteochondral lesion.  A small joint effusion is noted.  IMPRESSION:  1.  Tricompartmental degenerative changes and small joint effusion. 2.  No acute bony findings.   Original Report Authenticated By: Marijo Sanes, M.D.      No diagnosis found.  1. Left knee pain  MDM  Patient's last coumadin check was 2 days ago and they report it was 2.1 - less concerned for high value requiring intervention. No arthrocentesis performed secondary to patient's coagulopathy and minimal effusion present. Knee supported with knee immobilizer. Family has a Rx for rolling walker which they are working on obtaining  and patient/family are comfortable with discharge home. No further intervention.        Dewaine Oats, PA-C 11/19/12 1534

## 2012-11-19 NOTE — Progress Notes (Signed)
Orthopedic Tech Progress Note Patient Details:  [name] 1933/09/28 [id number]  Ortho Devices Type of Ortho Device: Knee Immobilizer Ortho Device/Splint Location: left knee Ortho Device/Splint Interventions: Application   [name] 11/19/2012, 3:29 PM

## 2012-11-19 NOTE — ED Notes (Signed)
Hurt left knee this am now swelling cannot bear wt

## 2012-11-19 NOTE — ED Notes (Signed)
PT returned from x ray  Via W/C. Pt waiting in lobby .

## 2012-11-19 NOTE — ED Provider Notes (Signed)
Medical screening examination/treatment/procedure(s) were conducted as a shared visit with non-physician practitioner(s) and myself.  I personally evaluated the patient during the encounter  [name] is a 79 y.o. female hx of DVT on coumadin, arthritis here with L knee pain. Recently d/c from rehab for R knee pain. Now having worse pain L knee. Xray showed arthritis and no fracture. Minimal fluid in joint but will not attempt aspiration since she is on coumadin. She was given knee immobilizer. She has Rx for rolling walker and family has aid at home. I told her that in the long term she will need knee replacement and she understands. She has ortho f/u.    Wandra Arthurs, MD 11/19/12 [phone]

## 2012-12-06 ENCOUNTER — Ambulatory Visit: Payer: Medicare Other | Admitting: Internal Medicine

## 2012-12-24 ENCOUNTER — Encounter: Payer: Self-pay | Admitting: Internal Medicine

## 2012-12-24 ENCOUNTER — Ambulatory Visit (INDEPENDENT_AMBULATORY_CARE_PROVIDER_SITE_OTHER): Payer: Medicare Other | Admitting: Internal Medicine

## 2012-12-24 ENCOUNTER — Encounter: Payer: Medicare Other | Admitting: Internal Medicine

## 2012-12-24 VITALS — BP 151/65 | HR 70 | Ht 64.0 in | Wt 195.0 lb

## 2012-12-24 DIAGNOSIS — I447 Left bundle-branch block, unspecified: Secondary | ICD-10-CM

## 2012-12-24 DIAGNOSIS — I5031 Acute diastolic (congestive) heart failure: Secondary | ICD-10-CM

## 2012-12-24 DIAGNOSIS — I471 Supraventricular tachycardia: Secondary | ICD-10-CM

## 2012-12-24 DIAGNOSIS — I10 Essential (primary) hypertension: Secondary | ICD-10-CM

## 2012-12-24 DIAGNOSIS — I498 Other specified cardiac arrhythmias: Secondary | ICD-10-CM

## 2012-12-24 NOTE — Assessment & Plan Note (Signed)
Salt restriction is advised Follow-up with Dr Beau Fanny is scheduled

## 2012-12-24 NOTE — Assessment & Plan Note (Signed)
S/p ablation x 2  Controlled with amiodarone No changes at this time

## 2012-12-24 NOTE — Assessment & Plan Note (Signed)
Stable No change required today  

## 2012-12-24 NOTE — Progress Notes (Signed)
PCP:  Maximino Greenland, MD Primary Cardiologist:  Dr Irish Lack  The patient presents today for electrophysiology followup.  Since her recent hospital discharge, the patient reports doing very well.  She denies symptoms of tachycardia or bradycardia.  Today, she denies symptoms of palpitations, chest pain, , orthopnea, PND,  dizziness, presyncope, syncope, or neurologic sequela. Her SOB and edema are stable. The patient feels that she is tolerating medications without difficulties and is otherwise without complaint today.   Past Medical History  Diagnosis Date  . Thrombocytopenia 11/21/2011  . DVT (deep venous thrombosis)   . Dizziness and giddiness   . Acute diastolic heart failure    No past surgical history on file.  Current Outpatient Prescriptions  Medication Sig Dispense Refill  . amiodarone (PACERONE) 200 MG tablet Take 1 tablet (200 mg total) by mouth daily.  30 tablet  11  . atenolol (TENORMIN) 50 MG tablet Take 25 mg by mouth daily.      [name] atorvastatin (LIPITOR) 20 MG tablet Take 20 mg by mouth daily.      . calcium carbonate (OS-CAL) 600 MG TABS Take 600 mg by mouth 2 (two) times daily with a meal.      . esomeprazole (NEXIUM) 40 MG capsule Take 40 mg by mouth daily before breakfast.      . felodipine (PLENDIL) 10 MG 24 hr tablet Take 10 mg by mouth daily.      . ferrous gluconate (FERGON) 324 MG tablet Take 324 mg by mouth daily with breakfast.      . furosemide (LASIX) 40 MG tablet Take 1 tablet (40 mg total) by mouth daily.  30 tablet  11  . HYDROcodone-acetaminophen (NORCO/VICODIN) 5-325 MG per tablet Take 1 tablet by mouth every 4 (four) hours as needed for pain.  12 tablet  0  . insulin aspart (NOVOLOG) 100 UNIT/ML injection Inject 6 Units into the skin 3 (three) times daily before meals. Sliding scale.  Patient doesn't have the information with her right now pertaining to the sliding scale.      . insulin glargine (LANTUS) 100 UNIT/ML injection Inject 34 Units into the  skin daily.      [name] lisinopril (PRINIVIL,ZESTRIL) 20 MG tablet Take 20 mg by mouth daily.      . magnesium oxide (MAG-OX) 400 MG tablet Take 400 mg by mouth daily.      [name] oxybutynin (DITROPAN) 5 MG tablet Take 5 mg by mouth 2 (two) times daily.      . potassium chloride (K-DUR) 10 MEQ tablet Take 20 mEq by mouth daily.      . Vitamin D, Ergocalciferol, (DRISDOL) 50000 UNITS CAPS Take 50,000 Units by mouth 2 (two) times a week.      . warfarin (COUMADIN) 10 MG tablet Take 1 tablet (10 mg total) by mouth daily. Takes 5 mg along with a 10 mg tablet in Monday, Wednesday and Friday, then takes 2.5 mg along with a 10 mg all other days      . Colesevelam HCl 3.75 G PACK Take 3.75 g by mouth daily.      [name] warfarin (COUMADIN) 10 MG tablet Take 10 mg by mouth daily.       No current facility-administered medications for this visit.    Allergies  Allergen Reactions  . Aspirin Itching  . Penicillins Rash    History   Social History  . Marital Status: Widowed    Spouse Name: N/A    Number of Children:  N/A  . Years of Education: N/A   Occupational History  . Not on file.   Social History Main Topics  . Smoking status: Never Smoker   . Smokeless tobacco: Never Used  . Alcohol Use: No  . Drug Use: No  . Sexually Active:    Other Topics Concern  . Not on file   Social History Narrative  . No narrative on file    Physical Exam: Filed Vitals:   12/24/12 1147  BP: 151/65  Pulse: 70  Height: 5\' 4"  (1.626 m)  Weight: 195 lb (88.451 kg)    GEN- The patient is elderly and overweight appearing, alert and oriented x 3 today.   Head- normocephalic, atraumatic Eyes-  Sclera clear, conjunctiva pink Ears- hearing intact Oropharynx- clear Neck- supple, Lungs- Clear to ausculation bilaterally, normal work of breathing Heart- Regular rate and rhythm, no murmurs, rubs or gallops, PMI not laterally displaced GI- soft, NT, ND, + BS Extremities- no clubbing, cyanosis, + edema MS- age  appropriate atrophy, get up and go test reveals significantly decreased ability to rise from a seated position Skin- no rash or lesion Psych- euthymic mood, full affect Neuro- strength and sensation are intact    Assessment and Plan:

## 2012-12-24 NOTE — Assessment & Plan Note (Signed)
First degree AV block and LBBB are stable, without symptoms of bradycardia. No changes at this time. She would like to avoid PPM

## 2012-12-24 NOTE — Patient Instructions (Addendum)
Your physician recommends that you schedule a follow-up appointment as needed.   Your physician recommends that you continue on your current medications as directed. Please refer to the Current Medication list given to you today.  

## 2013-01-07 ENCOUNTER — Encounter: Payer: Self-pay | Admitting: Internal Medicine

## 2013-02-22 ENCOUNTER — Ambulatory Visit
Admission: RE | Admit: 2013-02-22 | Discharge: 2013-02-22 | Disposition: A | Discharge status: Home / Self Care | Payer: Medicare Other | Source: Ambulatory Visit | Attending: Family | Admitting: Family

## 2013-02-22 ENCOUNTER — Other Ambulatory Visit: Payer: Self-pay | Admitting: Family

## 2013-02-22 DIAGNOSIS — R05 Cough: Secondary | ICD-10-CM

## 2013-02-22 DIAGNOSIS — R059 Cough, unspecified: Secondary | ICD-10-CM

## 2013-05-27 ENCOUNTER — Other Ambulatory Visit: Payer: Self-pay | Admitting: Geriatric Medicine

## 2013-06-18 ENCOUNTER — Ambulatory Visit: Payer: Medicare Other | Admitting: Dietician

## 2013-06-21 ENCOUNTER — Encounter: Payer: Self-pay | Admitting: Dietician

## 2013-06-21 ENCOUNTER — Encounter: Payer: Medicare Other | Attending: Endocrinology | Admitting: Dietician

## 2013-06-21 VITALS — Ht 63.0 in | Wt 202.3 lb

## 2013-06-21 DIAGNOSIS — E119 Type 2 diabetes mellitus without complications: Secondary | ICD-10-CM

## 2013-06-21 DIAGNOSIS — Z713 Dietary counseling and surveillance: Secondary | ICD-10-CM | POA: Insufficient documentation

## 2013-06-21 NOTE — Progress Notes (Signed)
Appt start time: 1130 end time: 1230.  Assessment:  Patient was seen on 06/21/13 for individual diabetes education. Pt reports with some good knowledge already, as she has been living with Diabetes for many years. She is multiple forms of insulin, including a sliding scale, and reports poor control both too high and too low (although her record only showed 3 instances of BG<70 in last 2 weeks, while BG>130 approximately half the checks).  Current HbA1c: 10.0 MEDICATIONS: see list   DIETARY INTAKE: Usual eating pattern includes 3 meals and 2 snacks per day. Everyday foods include fruit cup, oatmeal, sandwiches.  Avoided foods include limited leafy greens due to coumadin prescription.    24-hr recall:  B ( AM): oatmeal (butter, truvia, cooked in water), egg beaters, [place] bacon or other meat. Juice 6 oz. Rarely coffee or tea Snk ( AM): peanut butter crackers, 2% milk, fruit, juice  L ( PM): sandwich (lite mayo, mustard, [place] or chix or tunafish on whole wheat bead) with 1/2 cup vegetables, tea (decaf unsweet) or water, no sugar added fruit cup. Sometimes eats out at [employer]- admits to overeating. Snk ( PM): very rarely D ( PM): 2 veg (green beans, cauliflower, broccoli, salad, corn), meat (beef or broiled fish), starch (rice or potatoes, macaroni and cheese) Snk ( PM): may have an apple or fruit cup. May have a ham or lunchmeat sandwich or PB and graham crackers.  Beverages: juice, rarely coffee, tea, [employer] when BG is low.   Usual physical activity: very limited by difficulty walking secondary to loss of function following a heart attack in December  Progress Towards Goal(s):  In progress.   Nutritional Diagnosis:  NI-5.8.2 Excessive carbohydrate intake As related to intake of starchy vegetables with starch foods at meal time, unnecessarily large snacks.  As evidenced by pt diet recall, HgbA1c 10.0.    Intervention:  Nutrition counseling provided.  Discussed diabetes disease  process and treatment options.  Discussed physiology of diabetes and role of obesity on insulin resistance.  Encouraged moderate weight reduction to improve glucose levels.  Discussed role of medications and diet in glucose control  Provided education on macronutrients on glucose levels.  Provided education on carb counting, importance of regularly scheduled meals/snacks, and meal planning  Discussed effects of physical activity on glucose levels and long-term glucose control.  Recommended 150 minutes of physical activity/week.  Reviewed patient medications.  Discussed role of medication on blood glucose and possible side effects  Discussed blood glucose monitoring and interpretation.  Discussed recommended target ranges and individual ranges.    Described short-term complications: hyper- and hypo-glycemia.  Discussed causes,symptoms, and treatment options.  Discussed prevention, detection, and treatment of long-term complications.  Discussed the role of prolonged elevated glucose levels on body systems.  Discussed role of stress on blood glucose levels and discussed strategies to manage psychosocial issues.  Discussed recommendations for long-term diabetes self-care.  Established checklist for medical, dental, and emotional self-care.  Handouts given during visit include:  Living Well with Diabetes  Barriers to learning/adherance to lifestyle change: pt is very concerned with BG dropping too low due to insulin dosing, hesitant to reduce portions of snacks.   Diabetes self-care support plan:   [facility] support group  RD and pt worked together to identify patterns of excessive CHO intake at snack time, and when she treats starchy veg like a nonstarchy veg. She also admits to overeating when she eats out. RD and pt came to an understanding of what is the  necessary CHO portion for snack times, and identified a plate with an appropriate starch portion and large portion of nonstarchy veg.    Monitoring/Evaluation:  Dietary intake, exercise, portion control, BG, and body weight in 6 week(s).

## 2013-08-05 ENCOUNTER — Ambulatory Visit: Payer: Medicare Other | Admitting: Dietician

## 2013-08-06 ENCOUNTER — Telehealth: Payer: Self-pay | Admitting: Interventional Cardiology

## 2013-08-06 ENCOUNTER — Ambulatory Visit (INDEPENDENT_AMBULATORY_CARE_PROVIDER_SITE_OTHER): Payer: Medicare Other | Admitting: Interventional Cardiology

## 2013-08-06 ENCOUNTER — Encounter: Payer: Self-pay | Admitting: Interventional Cardiology

## 2013-08-06 ENCOUNTER — Ambulatory Visit: Payer: Medicare Other | Admitting: Interventional Cardiology

## 2013-08-06 ENCOUNTER — Encounter (INDEPENDENT_AMBULATORY_CARE_PROVIDER_SITE_OTHER): Payer: Self-pay

## 2013-08-06 VITALS — BP 170/78 | HR 59 | Ht 65.0 in | Wt 211.0 lb

## 2013-08-06 DIAGNOSIS — R0602 Shortness of breath: Secondary | ICD-10-CM

## 2013-08-06 DIAGNOSIS — I471 Supraventricular tachycardia: Secondary | ICD-10-CM

## 2013-08-06 DIAGNOSIS — I498 Other specified cardiac arrhythmias: Secondary | ICD-10-CM

## 2013-08-06 DIAGNOSIS — I5031 Acute diastolic (congestive) heart failure: Secondary | ICD-10-CM

## 2013-08-06 DIAGNOSIS — I10 Essential (primary) hypertension: Secondary | ICD-10-CM

## 2013-08-06 DIAGNOSIS — R609 Edema, unspecified: Secondary | ICD-10-CM | POA: Insufficient documentation

## 2013-08-06 MED ORDER — FUROSEMIDE 80 MG PO TABS: 80.0000 mg | ORAL_TABLET | Freq: Two times a day (BID) | ORAL | Status: DC

## 2013-08-06 NOTE — Telephone Encounter (Signed)
New problem      Both legs are swollen, this morning around 3 am, pt has taken a dieretic.  Swelling has not gone down.  Pt feeling faint and SOB.

## 2013-08-06 NOTE — Telephone Encounter (Signed)
Add pt to our schedule today per Dr. Irish Lack.

## 2013-08-06 NOTE — Patient Instructions (Signed)
Your physician has requested that you have an echocardiogram. Echocardiography is a painless test that uses sound waves to create images of your heart. It provides your doctor with information about the size and shape of your heart and how well your heart's chambers and valves are working. This procedure takes approximately one hour. There are no restrictions for this procedure.  Your physician recommends that you go to the lab today for Bmet and BNP.  Your physician has recommended you make the following change in your medication:   1. Increase Lasix to 80mg  1 tab by mouth twice a day. Refilled to Pharmacy.

## 2013-08-06 NOTE — Progress Notes (Signed)
Patient ID: [name], female   DOB: 06/06/1933, 79 y.o.   MRN: [id number]    [name], [place]  [address] Phone: [phone] Fax:  [phone]  Date:  08/06/2013   ID:  [name], DOB 12/25/1932, MRN [id number]  PCP:  Maximino Greenland, MD      History of Present Illness: [name] is a 79 y.o. female who has had diastolic dysfunction in the past. She is on Coumadin for DVT. She had some palpitations that correlate with the presyncope in the past. No sx like this recently. She has some LE edemawhich is worsened, over the past 3 days.  Her weight has gone up. Sugars have been poorly controlled.  She had a low BS reading early today and had to drink some juice.   Knee pain as well. She walks with a walker.  Fatigue worse over the past few days.   She feels like his vision is unchanged in last few days. Her overall capacity for exercise has been reduced for quite some time.   Wt Readings from Last 3 Encounters:  08/06/13 211 lb (95.709 kg)  06/21/13 202 lb 4.8 oz (91.763 kg)  12/24/12 195 lb (88.451 kg)     Past Medical History  Diagnosis Date  . Thrombocytopenia 11/21/2011  . DVT (deep venous thrombosis)   . Dizziness and giddiness   . Acute diastolic heart failure     Current Outpatient Prescriptions  Medication Sig Dispense Refill  . albuterol (PROVENTIL HFA;VENTOLIN HFA) 108 (90 BASE) MCG/ACT inhaler Inhale 2 puffs into the lungs every 6 (six) hours as needed for wheezing.      [name] amiodarone (PACERONE) 200 MG tablet Take 1 tablet (200 mg total) by mouth daily.  30 tablet  11  . atenolol (TENORMIN) 50 MG tablet Take 25 mg by mouth daily.      [name] atorvastatin (LIPITOR) 20 MG tablet Take 20 mg by mouth daily.      . calcium carbonate (OS-CAL) 600 MG TABS Take 600 mg by mouth 2 (two) times daily with a meal.      . Colesevelam HCl 3.75 G PACK Take 3.75 g by mouth daily.      [name] esomeprazole (NEXIUM) 40 MG capsule Take 40 mg by mouth daily before  breakfast.      . ferrous gluconate (FERGON) 324 MG tablet Take 325 mg by mouth daily with breakfast.       . furosemide (LASIX) 40 MG tablet Take 40 mg by mouth 2 (two) times daily.      . insulin aspart (NOVOLOG) 100 UNIT/ML injection Inject 6 Units into the skin 3 (three) times daily before meals. Sliding scale.  Patient doesn't have the information with her right now pertaining to the sliding scale.      . insulin glargine (LANTUS) 100 UNIT/ML injection Inject 34 Units into the skin daily.      [name] losartan (COZAAR) 100 MG tablet Take 100 mg by mouth daily.      . magnesium oxide (MAG-OX) 400 MG tablet Take 400 mg by mouth daily.      [name] oxybutynin (DITROPAN) 5 MG tablet Take 5 mg by mouth 2 (two) times daily.      . potassium chloride (K-DUR) 10 MEQ tablet Take 20 mEq by mouth daily.      . traMADol (ULTRAM) 50 MG tablet Take 50 mg by mouth every 6 (six) hours as needed for pain.      [name]  Vitamin D, Ergocalciferol, (DRISDOL) 50000 UNITS CAPS Take 5,000 Units by mouth 2 (two) times a week.       . warfarin (COUMADIN) 10 MG tablet Take 10 mg by mouth daily. Takes 7.5 mg along with a 10 mg tablet DAILY,, Wednesday and Friday takes 3.75 mg       No current facility-administered medications for this visit.    Allergies:    Allergies  Allergen Reactions  . Aspirin Itching  . Penicillins Rash    Social History:  The patient  reports that she has never smoked. She has never used smokeless tobacco. She reports that she does not drink alcohol or use illicit drugs.   Family History:  The patient's family history is not on file.   ROS:  Please see the history of present illness.  No nausea, vomiting.  No fevers, chills.  No focal weakness.  No dysuria.    All other systems reviewed and negative.   PHYSICAL EXAM: VS:  BP 170/78  Pulse 59  Ht 5\' 5"  (1.651 m)  Wt 211 lb (95.709 kg)  BMI 35.11 kg/m2 Well nourished, well developed, in no acute distress HEENT: normal Neck: no JVD, no carotid  bruits Cardiac:  normal S1, S2; RRR;  Lungs:  clear to auscultation bilaterally, no wheezing, rhonchi or rales Abd: soft, nontender, no hepatomegaly Ext: no edema Skin: warm and dry Neuro:   no focal abnormalities noted  EKG:  NSR, prolonged PR, LBBB  ASSESSMENT AND PLAN:  Chronic diastolic heart failure  Increase Furosemide Tablet, 80 Milligram, TAKE 1 TABLET BY MOUTH TWICE DAILY OR AS DIRECTED Notes: May need to use Metolazone if BNP elevated.  Check BNP and be met today. Check echocardiogram as well.  2. Essential hypertension, benign  Continue Atenolol Tablet, 50 MG, 1/2 tab, Orally, Once a day Notes: Patient Educated with: [facility] BP Action plan.pdf ([facility] BP Action plan.pdf). COntroled.  3. Arrhythmia  Continue Amiodarone HCl Tablet, 200 MG, 1 tablet, Orally, Once a day Notes: No sx from SVT.  4. Bilateral leg edema  Notes:  Can use compression stockings. Elevate legs as much as possible.   5.  SHOB.  BNP and BMEt today.      Signed, Mina Marble, MD, [facility] 08/06/2013 3:44 PM

## 2013-08-07 LAB — BASIC METABOLIC PANEL
Chloride: 103 mEq/L (ref 96–112)
Creatinine, Ser: 1.2 mg/dL (ref 0.4–1.2)
GFR: 54.24 mL/min — ABNORMAL LOW (ref >60.00)
Potassium: 4.1 mEq/L (ref 3.5–5.1)

## 2013-08-07 LAB — BRAIN NATRIURETIC PEPTIDE: Pro B Natriuretic peptide (BNP): 285 pg/mL — ABNORMAL HIGH (ref 0.0–100.0)

## 2013-08-09 ENCOUNTER — Telehealth: Payer: Self-pay | Admitting: Cardiology

## 2013-08-09 DIAGNOSIS — Z79899 Other long term (current) drug therapy: Secondary | ICD-10-CM

## 2013-08-09 NOTE — Telephone Encounter (Signed)
Pt notified of lab results. Pt will come back for lab on 08/13/13.

## 2013-08-09 NOTE — Telephone Encounter (Signed)
Called pt back to give her lab results.

## 2013-08-09 NOTE — Telephone Encounter (Signed)
Message copied by Alcario Drought on Fri Aug 09, 2013  1:52 PM ------      Message from: Jettie Booze      Created: Wed Aug 07, 2013 11:17 AM       Mildly increased fluid level. Stable renal function. COntinue increased dose of Lasix for a week.  Bmet in one week with [place] or with PMD. ------

## 2013-08-09 NOTE — Telephone Encounter (Signed)
F/up  Pt returned call// believes it was about her results.. Please call back to clarify.

## 2013-08-10 ENCOUNTER — Other Ambulatory Visit: Payer: Self-pay | Admitting: Interventional Cardiology

## 2013-08-13 ENCOUNTER — Other Ambulatory Visit (INDEPENDENT_AMBULATORY_CARE_PROVIDER_SITE_OTHER): Payer: Medicare Other

## 2013-08-13 DIAGNOSIS — Z79899 Other long term (current) drug therapy: Secondary | ICD-10-CM

## 2013-08-13 LAB — BASIC METABOLIC PANEL
CO2: 31 mEq/L (ref 19–32)
Calcium: 9 mg/dL (ref 8.4–10.5)
Chloride: 101 mEq/L (ref 96–112)
Creatinine, Ser: 1.2 mg/dL (ref 0.4–1.2)
Sodium: 140 mEq/L (ref 135–145)

## 2013-08-19 ENCOUNTER — Other Ambulatory Visit (HOSPITAL_COMMUNITY): Payer: Self-pay | Admitting: Interventional Cardiology

## 2013-08-19 DIAGNOSIS — R0602 Shortness of breath: Secondary | ICD-10-CM

## 2013-08-21 ENCOUNTER — Ambulatory Visit (HOSPITAL_COMMUNITY): Payer: Medicare Other | Attending: Cardiology

## 2013-08-21 DIAGNOSIS — Z86718 Personal history of other venous thrombosis and embolism: Secondary | ICD-10-CM | POA: Insufficient documentation

## 2013-08-21 DIAGNOSIS — I509 Heart failure, unspecified: Secondary | ICD-10-CM | POA: Insufficient documentation

## 2013-08-21 DIAGNOSIS — I379 Nonrheumatic pulmonary valve disorder, unspecified: Secondary | ICD-10-CM | POA: Insufficient documentation

## 2013-08-21 DIAGNOSIS — I10 Essential (primary) hypertension: Secondary | ICD-10-CM | POA: Insufficient documentation

## 2013-08-21 DIAGNOSIS — R0602 Shortness of breath: Secondary | ICD-10-CM

## 2013-08-21 DIAGNOSIS — R0609 Other forms of dyspnea: Secondary | ICD-10-CM | POA: Insufficient documentation

## 2013-08-21 DIAGNOSIS — I079 Rheumatic tricuspid valve disease, unspecified: Secondary | ICD-10-CM | POA: Insufficient documentation

## 2013-08-21 DIAGNOSIS — I447 Left bundle-branch block, unspecified: Secondary | ICD-10-CM | POA: Insufficient documentation

## 2013-08-21 DIAGNOSIS — I059 Rheumatic mitral valve disease, unspecified: Secondary | ICD-10-CM | POA: Insufficient documentation

## 2013-08-21 DIAGNOSIS — I498 Other specified cardiac arrhythmias: Secondary | ICD-10-CM | POA: Insufficient documentation

## 2013-08-21 DIAGNOSIS — R0989 Other specified symptoms and signs involving the circulatory and respiratory systems: Secondary | ICD-10-CM | POA: Insufficient documentation

## 2013-08-21 NOTE — Progress Notes (Signed)
Echocardiogram performed.  

## 2013-08-27 ENCOUNTER — Encounter: Payer: Self-pay | Admitting: Interventional Cardiology

## 2013-09-06 ENCOUNTER — Telehealth: Payer: Self-pay | Admitting: Interventional Cardiology

## 2013-09-06 NOTE — Telephone Encounter (Signed)
Called pt back with results.

## 2013-09-06 NOTE — Telephone Encounter (Signed)
New messge    Want [name] to call again and give her the echo results.  When she called last week, she was in a crowd of people and could not hear what she said.

## 2013-09-15 ENCOUNTER — Other Ambulatory Visit: Payer: Self-pay | Admitting: Interventional Cardiology

## 2013-09-16 ENCOUNTER — Telehealth: Payer: Self-pay | Admitting: Interventional Cardiology

## 2013-09-16 NOTE — Telephone Encounter (Signed)
New message      Pls fax echo report to Dr Shirlean Mylar Sanders---[phone]---fax number is [phone]

## 2013-09-16 NOTE — Telephone Encounter (Signed)
Report faxed.

## 2013-10-24 ENCOUNTER — Other Ambulatory Visit (HOSPITAL_COMMUNITY): Payer: Self-pay | Admitting: Interventional Cardiology

## 2013-11-02 ENCOUNTER — Emergency Department (HOSPITAL_BASED_OUTPATIENT_CLINIC_OR_DEPARTMENT_OTHER): Payer: Medicare Other

## 2013-11-02 ENCOUNTER — Emergency Department (HOSPITAL_BASED_OUTPATIENT_CLINIC_OR_DEPARTMENT_OTHER)
Admission: EM | Admit: 2013-11-02 | Discharge: 2013-11-02 | Disposition: A | Discharge status: Home / Self Care | Payer: Medicare Other | Attending: Emergency Medicine | Admitting: Emergency Medicine

## 2013-11-02 ENCOUNTER — Other Ambulatory Visit (HOSPITAL_COMMUNITY): Payer: Self-pay | Admitting: Interventional Cardiology

## 2013-11-02 ENCOUNTER — Encounter (HOSPITAL_BASED_OUTPATIENT_CLINIC_OR_DEPARTMENT_OTHER): Payer: Self-pay | Admitting: Emergency Medicine

## 2013-11-02 DIAGNOSIS — I5031 Acute diastolic (congestive) heart failure: Secondary | ICD-10-CM | POA: Insufficient documentation

## 2013-11-02 DIAGNOSIS — R6889 Other general symptoms and signs: Secondary | ICD-10-CM

## 2013-11-02 DIAGNOSIS — Z794 Long term (current) use of insulin: Secondary | ICD-10-CM | POA: Insufficient documentation

## 2013-11-02 DIAGNOSIS — E860 Dehydration: Secondary | ICD-10-CM

## 2013-11-02 DIAGNOSIS — R5381 Other malaise: Secondary | ICD-10-CM | POA: Insufficient documentation

## 2013-11-02 DIAGNOSIS — Z86711 Personal history of pulmonary embolism: Secondary | ICD-10-CM | POA: Insufficient documentation

## 2013-11-02 DIAGNOSIS — J111 Influenza due to unidentified influenza virus with other respiratory manifestations: Secondary | ICD-10-CM | POA: Insufficient documentation

## 2013-11-02 DIAGNOSIS — Z7901 Long term (current) use of anticoagulants: Secondary | ICD-10-CM | POA: Insufficient documentation

## 2013-11-02 DIAGNOSIS — R609 Edema, unspecified: Secondary | ICD-10-CM | POA: Insufficient documentation

## 2013-11-02 DIAGNOSIS — Z79899 Other long term (current) drug therapy: Secondary | ICD-10-CM | POA: Insufficient documentation

## 2013-11-02 DIAGNOSIS — I517 Cardiomegaly: Secondary | ICD-10-CM | POA: Insufficient documentation

## 2013-11-02 DIAGNOSIS — Z862 Personal history of diseases of the blood and blood-forming organs and certain disorders involving the immune mechanism: Secondary | ICD-10-CM | POA: Insufficient documentation

## 2013-11-02 DIAGNOSIS — Z880 Allergy status to penicillin: Secondary | ICD-10-CM | POA: Insufficient documentation

## 2013-11-02 DIAGNOSIS — R509 Fever, unspecified: Secondary | ICD-10-CM | POA: Insufficient documentation

## 2013-11-02 DIAGNOSIS — R5383 Other fatigue: Secondary | ICD-10-CM

## 2013-11-02 LAB — CBC WITH DIFFERENTIAL/PLATELET
Basophils Absolute: 0 10*3/uL (ref 0.0–0.1)
Basophils Relative: 0 % (ref 0–1)
EOS PCT: 2 % (ref 0–5)
Eosinophils Absolute: 0.1 10*3/uL (ref 0.0–0.7)
HCT: 31.8 % — ABNORMAL LOW (ref 36.0–46.0)
Hemoglobin: 9.9 g/dL — ABNORMAL LOW (ref 12.0–15.0)
LYMPHS ABS: 0.3 10*3/uL — AB (ref 0.7–4.0)
LYMPHS PCT: 11 % — AB (ref 12–46)
MCH: 30.3 pg (ref 26.0–34.0)
MCHC: 31.1 g/dL (ref 30.0–36.0)
MCV: 97.2 fL (ref 78.0–100.0)
Monocytes Absolute: 0.4 10*3/uL (ref 0.1–1.0)
Monocytes Relative: 14 % — ABNORMAL HIGH (ref 3–12)
NEUTROS PCT: 74 % (ref 43–77)
Neutro Abs: 2.2 10*3/uL (ref 1.7–7.7)
Platelets: 100 10*3/uL — ABNORMAL LOW (ref 150–400)
RBC: 3.27 MIL/uL — AB (ref 3.87–5.11)
RDW: 14.4 % (ref 11.5–15.5)
WBC: 3 10*3/uL — AB (ref 4.0–10.5)

## 2013-11-02 LAB — BASIC METABOLIC PANEL WITH GFR
BUN: 24 mg/dL — ABNORMAL HIGH (ref 6–23)
CO2: 30 meq/L (ref 19–32)
Calcium: 8.9 mg/dL (ref 8.4–10.5)
Chloride: 101 meq/L (ref 96–112)
Creatinine, Ser: 1.1 mg/dL (ref 0.50–1.10)
GFR calc Af Amer: 54 mL/min — ABNORMAL LOW
GFR calc non Af Amer: 47 mL/min — ABNORMAL LOW
Glucose, Bld: 153 mg/dL — ABNORMAL HIGH (ref 70–99)
Potassium: 3.8 meq/L (ref 3.7–5.3)
Sodium: 142 meq/L (ref 137–147)

## 2013-11-02 LAB — CG4 I-STAT (LACTIC ACID): Lactic Acid, Venous: 0.82 mmol/L (ref 0.5–2.2)

## 2013-11-02 MED ORDER — OSELTAMIVIR PHOSPHATE 75 MG PO CAPS: 75.0000 mg | ORAL_CAPSULE | Freq: Two times a day (BID) | ORAL | Status: DC

## 2013-11-02 MED ORDER — ACETAMINOPHEN 325 MG PO TABS
650.0000 mg | ORAL_TABLET | Freq: Once | ORAL | Status: AC
  Administered 2013-11-02: 650 mg via ORAL
  Filled 2013-11-02: qty 2

## 2013-11-02 MED ORDER — OSELTAMIVIR PHOSPHATE 75 MG PO CAPS
75.0000 mg | ORAL_CAPSULE | Freq: Once | ORAL | Status: AC
  Administered 2013-11-02: 75 mg via ORAL
  Filled 2013-11-02: qty 1

## 2013-11-02 NOTE — ED Notes (Addendum)
Patient here with productive cough, chest pain and bodyaches 1 day.  Denies fever. On assessment patient hot to touch and slightly tacyphneic. Bilateral ankle swelling noted, facial swelling noted as well, hx of CHF

## 2013-11-02 NOTE — Discharge Instructions (Signed)
If you were given medicines take as directed.  If you are on coumadin or contraceptives realize their levels and effectiveness is altered by many different medicines.  If you have any reaction (rash, tongues swelling, other) to the medicines stop taking and see a physician.   °Please follow up as directed and return to the ER or see a physician for new or worsening symptoms.  Thank you. ° ° °

## 2013-11-02 NOTE — ED Provider Notes (Signed)
CSN: [id number]     Arrival date & time 11/02/13  1217 History   First MD Initiated Contact with Patient 11/02/13 1257     Chief Complaint  Patient presents with  . Cough  . Nasal Congestion  . Fever   (Consider location/radiation/quality/duration/timing/severity/associated sxs/prior Treatment) HPI Comments: 80 yo female with diastolic HF, SVT, LBBB presents with cough, fevers and congestion for one day.  No sick contacts. Pt had similar one month ago. Tolerating po.  Mild worsening leg swelling/ wt gain, pt trying to minimize salt.  No sob or cp.  No travel.   Patient is a 80 y.o. female presenting with cough and fever. The history is provided by the patient.  Cough Cough characteristics:  Productive Associated symptoms: chills and fever   Associated symptoms: no chest pain, no headaches, no rash and no shortness of breath   Fever Associated symptoms: chills and cough   Associated symptoms: no chest pain, no congestion, no dysuria, no headaches, no rash and no vomiting     Past Medical History  Diagnosis Date  . Thrombocytopenia 11/21/2011  . DVT (deep venous thrombosis)   . Dizziness and giddiness   . Acute diastolic heart failure    History reviewed. No pertinent past surgical history. Family History  Problem Relation Age of Onset  . Hypertension Daughter    History  Substance Use Topics  . Smoking status: Never Smoker   . Smokeless tobacco: Never Used  . Alcohol Use: No   OB History   Grav Para Term Preterm Abortions TAB SAB Ect Mult Living                 Review of Systems  Constitutional: Positive for fever, chills, appetite change and fatigue.  HENT: Negative for congestion.   Eyes: Negative for visual disturbance.  Respiratory: Positive for cough. Negative for shortness of breath.   Cardiovascular: Positive for leg swelling. Negative for chest pain.  Gastrointestinal: Negative for vomiting and abdominal pain.  Genitourinary: Negative for dysuria and flank  pain.  Musculoskeletal: Negative for back pain, neck pain and neck stiffness.  Skin: Negative for rash.  Neurological: Negative for light-headedness and headaches.    Allergies  Aspirin and Penicillins  Home Medications   Current Outpatient Rx  Name  Route  Sig  Dispense  Refill  . albuterol (PROVENTIL HFA;VENTOLIN HFA) 108 (90 BASE) MCG/ACT inhaler   Inhalation   Inhale 2 puffs into the lungs every 6 (six) hours as needed for wheezing.         [name] amiodarone (PACERONE) 200 MG tablet   Oral   Take 1 tablet (200 mg total) by mouth daily.   30 tablet   11   . atenolol (TENORMIN) 25 MG tablet      TAKE 1 TABLET BY MOUTH DAILY   30 tablet   0   . atenolol (TENORMIN) 50 MG tablet   Oral   Take 25 mg by mouth daily.         [name] atorvastatin (LIPITOR) 20 MG tablet   Oral   Take 20 mg by mouth daily.         . calcium carbonate (OS-CAL) 600 MG TABS   Oral   Take 600 mg by mouth 2 (two) times daily with a meal.         . Colesevelam HCl 3.75 G PACK   Oral   Take 3.75 g by mouth daily.         [name]  esomeprazole (NEXIUM) 40 MG capsule   Oral   Take 40 mg by mouth daily before breakfast.         . ferrous gluconate (FERGON) 324 MG tablet   Oral   Take 325 mg by mouth daily with breakfast.          . furosemide (LASIX) 80 MG tablet   Oral   Take 1 tablet (80 mg total) by mouth 2 (two) times daily.   60 tablet   5   . insulin aspart (NOVOLOG) 100 UNIT/ML injection   Subcutaneous   Inject 6 Units into the skin 3 (three) times daily before meals. Sliding scale.  Patient doesn't have the information with her right now pertaining to the sliding scale.         . insulin glargine (LANTUS) 100 UNIT/ML injection   Subcutaneous   Inject 34 Units into the skin daily.         [name] losartan (COZAAR) 100 MG tablet   Oral   Take 100 mg by mouth daily.         . magnesium oxide (MAG-OX) 400 MG tablet   Oral   Take 400 mg by mouth daily.         [name] oxybutynin  (DITROPAN) 5 MG tablet   Oral   Take 5 mg by mouth 2 (two) times daily.         . potassium chloride (K-DUR) 10 MEQ tablet   Oral   Take 20 mEq by mouth daily.         . potassium chloride (MICRO-K) 10 MEQ CR capsule      TAKE 2 CAPSULES BY MOUTH DAILY   180 capsule   1   . traMADol (ULTRAM) 50 MG tablet   Oral   Take 50 mg by mouth every 6 (six) hours as needed for pain.         . Vitamin D, Ergocalciferol, (DRISDOL) 50000 UNITS CAPS   Oral   Take 5,000 Units by mouth 2 (two) times a week.          . warfarin (COUMADIN) 10 MG tablet   Oral   Take 10 mg by mouth daily. Takes 7.5 mg along with a 10 mg tablet DAILY,, Wednesday and Friday takes 3.75 mg          BP 147/69  Pulse 67  Temp(Src) 102.6 F (39.2 C) (Oral)  Resp 20  SpO2 99% Physical Exam  Nursing note and vitals reviewed. Constitutional: She is oriented to person, place, and time. She appears well-developed and well-nourished.  HENT:  Head: Normocephalic and atraumatic.  Mild dry mm  Eyes: Conjunctivae are normal. Right eye exhibits no discharge. Left eye exhibits no discharge.  Neck: Normal range of motion. Neck supple. No JVD present. No tracheal deviation present.  Cardiovascular: Normal rate and regular rhythm.   Pulmonary/Chest: Effort normal. No respiratory distress. She has rales (sparse at bases).  Abdominal: Soft. She exhibits no distension. There is no tenderness. There is no guarding.  Musculoskeletal: She exhibits edema (1+ bilateral LE).  Neurological: She is alert and oriented to person, place, and time.  Skin: Skin is warm. No rash noted.  Psychiatric: She has a normal mood and affect.    ED Course  Procedures (including critical care time) Labs Review Labs Reviewed  BASIC METABOLIC PANEL - Abnormal; Notable for the following:    Glucose, Bld 153 (*)    BUN 24 (*)    GFR calc  non Af Amer 47 (*)    GFR calc Af Amer 54 (*)    All other components within normal limits  CBC WITH  DIFFERENTIAL - Abnormal; Notable for the following:    WBC 3.0 (*)    RBC 3.27 (*)    Hemoglobin 9.9 (*)    HCT 31.8 (*)    Platelets 100 (*)    Lymphocytes Relative 11 (*)    Lymphs Abs 0.3 (*)    Monocytes Relative 14 (*)    All other components within normal limits  CG4 I-STAT (LACTIC ACID)   Imaging Review Dg Chest 2 View  11/02/2013   CLINICAL DATA:  Productive cough, upper chest pain and congestion.  EXAM: CHEST - 2 VIEW  COMPARISON:  02/22/2013  FINDINGS: The heart is mildly enlarged. There is no evidence of pulmonary edema, consolidation, pneumothorax, nodule or pleural fluid. Stable degenerative changes are present in the thoracic spine.  IMPRESSION: Mild cardiomegaly.  No active disease.   Electronically Signed   By: Aletta Edouard M.D.   On: 11/02/2013 13:10    EKG Interpretation    Date/Time:  Saturday November 02 2013 12:35:04 EST Ventricular Rate:  66 PR Interval:  336 QRS Duration: 148 QT Interval:  454 QTC Calculation: 475 R Axis:   -51 Text Interpretation:  Sinus rhythm with 1st degree A-V block Left axis deviation Left bundle branch block Similar to previous Confirmed by Garvin Ellena  MD, Aibhlinn Kalmar ([id number]) on 11/02/2013 3:59:14 PM            MDM   1. Flu-like symptoms   2. Fever   3. Dehydration   4. Cardiomegaly    Flu sxs. With cardiac hx/ age, labs/ lactate/ antipyretics. CXR no acute findings, reviewed, mild cardiomegaly. PO fluids. No signs of acute pulm edema. Filed Vitals:   11/02/13 1229 11/02/13 1231 11/02/13 1539  BP: 147/69 147/69 136/58  Pulse: 66 67 63  Temp: 102.6 F (39.2 C) 102.6 F (39.2 C) 100 F (37.8 C)  TempSrc: Oral Oral Oral  Resp: 18 20 18   SpO2: 98% 99% 99%    Since within 48 hrs and complex medical hx pt given tamiflu and strict reasons to return.  No signs of sepsis at this time.  Results and differential diagnosis were discussed with the patient. Close follow up outpatient was discussed, patient comfortable with the  plan.        Mariea Clonts, MD 11/02/13 1600

## 2013-11-24 ENCOUNTER — Other Ambulatory Visit (HOSPITAL_COMMUNITY): Payer: Self-pay | Admitting: Interventional Cardiology

## 2013-11-28 ENCOUNTER — Ambulatory Visit: Payer: Medicare Other | Admitting: Interventional Cardiology

## 2013-12-18 ENCOUNTER — Ambulatory Visit: Payer: Medicare Other | Admitting: Interventional Cardiology

## 2013-12-30 ENCOUNTER — Encounter: Payer: Self-pay | Admitting: Interventional Cardiology

## 2014-01-03 ENCOUNTER — Encounter: Payer: Self-pay | Admitting: Interventional Cardiology

## 2014-01-17 ENCOUNTER — Telehealth: Payer: Self-pay | Admitting: Interventional Cardiology

## 2014-01-17 NOTE — Telephone Encounter (Signed)
Returned call with EF.

## 2014-01-17 NOTE — Telephone Encounter (Signed)
New Prob    Requesting pts most recent EF. States if she is unavailable, OK to leave on her VM.

## 2014-02-01 ENCOUNTER — Other Ambulatory Visit: Payer: Self-pay | Admitting: Interventional Cardiology

## 2014-02-18 ENCOUNTER — Other Ambulatory Visit: Payer: Self-pay | Admitting: Interventional Cardiology

## 2014-03-06 ENCOUNTER — Other Ambulatory Visit (HOSPITAL_COMMUNITY): Payer: Self-pay | Admitting: Interventional Cardiology

## 2014-03-19 ENCOUNTER — Encounter (INDEPENDENT_AMBULATORY_CARE_PROVIDER_SITE_OTHER): Payer: Self-pay

## 2014-03-19 ENCOUNTER — Ambulatory Visit (INDEPENDENT_AMBULATORY_CARE_PROVIDER_SITE_OTHER): Payer: Medicare Other | Admitting: Interventional Cardiology

## 2014-03-19 ENCOUNTER — Other Ambulatory Visit (HOSPITAL_COMMUNITY): Payer: Self-pay | Admitting: Interventional Cardiology

## 2014-03-19 ENCOUNTER — Encounter: Payer: Self-pay | Admitting: Interventional Cardiology

## 2014-03-19 VITALS — BP 122/74 | HR 64 | Ht 65.0 in | Wt 223.0 lb

## 2014-03-19 DIAGNOSIS — I498 Other specified cardiac arrhythmias: Secondary | ICD-10-CM

## 2014-03-19 DIAGNOSIS — I10 Essential (primary) hypertension: Secondary | ICD-10-CM

## 2014-03-19 DIAGNOSIS — I5032 Chronic diastolic (congestive) heart failure: Secondary | ICD-10-CM

## 2014-03-19 DIAGNOSIS — I5033 Acute on chronic diastolic (congestive) heart failure: Secondary | ICD-10-CM | POA: Insufficient documentation

## 2014-03-19 DIAGNOSIS — I471 Supraventricular tachycardia: Secondary | ICD-10-CM

## 2014-03-19 DIAGNOSIS — R609 Edema, unspecified: Secondary | ICD-10-CM

## 2014-03-19 NOTE — Patient Instructions (Signed)
Your physician recommends that you continue on your current medications as directed. Please refer to the Current Medication list given to you today.  Your physician wants you to follow-up in: 6 months with Dr. Varanasi.  You will receive a reminder letter in the mail two months in advance. If you don't receive a letter, please call our office to schedule the follow-up appointment.  

## 2014-03-19 NOTE — Progress Notes (Signed)
Patient ID: [name], female   DOB: [birth year], 80 y.o.   MRN: [id number] Patient ID: [name], female   DOB: Apr 16, 1933, 80 y.o.   MRN: [id number]    [place], [place], [place]  [address] Phone: [phone] Fax:  [phone]  Date:  03/19/2014   ID:  [name], DOB April 11, 1933, MRN [id number]  PCP:  Maximino Greenland, MD      History of Present Illness: [name] is a 80 y.o. female who has had diastolic dysfunction in the past. She is on Coumadin for DVT. She had some palpitations that correlate with the presyncope in the past. No sx like this recently. She has some LE edema which is better.  Will be wearing compression stockings.  Her weight has gone up. Sugars have been poorly controlled.  She had a low BS reading early today and had to drink some juice.   Knee pain as well. She walks with a walker.     She feels like his vision is unchanged in last few days. Her overall capacity for exercise has been reduced for quite some time.   Wt Readings from Last 3 Encounters:  03/19/14 223 lb (101.152 kg)  08/06/13 211 lb (95.709 kg)  06/21/13 202 lb 4.8 oz (91.763 kg)     Past Medical History  Diagnosis Date  . Thrombocytopenia 11/21/2011  . DVT (deep venous thrombosis)   . Dizziness and giddiness   . Acute diastolic heart failure     Current Outpatient Prescriptions  Medication Sig Dispense Refill  . amiodarone (PACERONE) 200 MG tablet TAKE 1 TABLET BY MOUTH DAILY  30 tablet  0  . atenolol (TENORMIN) 50 MG tablet Take 25 mg by mouth daily.      [name] atorvastatin (LIPITOR) 20 MG tablet Take 20 mg by mouth daily.      . calcium carbonate (OS-CAL) 600 MG TABS Take 600 mg by mouth 2 (two) times daily with a meal.      . Colesevelam HCl 3.75 G PACK Take 3.75 g by mouth daily.      [name] esomeprazole (NEXIUM) 40 MG capsule Take 40 mg by mouth daily before breakfast.      . ferrous gluconate (FERGON) 324 MG tablet Take 325 mg by mouth daily with breakfast.       .  furosemide (LASIX) 80 MG tablet TAKE 1 TABLET BY MOUTH TWICE DAILY  60 tablet  1  . insulin aspart (NOVOLOG) 100 UNIT/ML injection Inject 6 Units into the skin 3 (three) times daily before meals. Sliding scale.  Patient doesn't have the information with her right now pertaining to the sliding scale.      . insulin glargine (LANTUS) 100 UNIT/ML injection Inject 34 Units into the skin daily.      [name] losartan (COZAAR) 100 MG tablet TAKE 1 TABLET BY MOUTH ONCE A DAY  30 tablet  0  . magnesium oxide (MAG-OX) 400 MG tablet Take 400 mg by mouth daily.      [name] oxybutynin (DITROPAN) 5 MG tablet Take 5 mg by mouth 2 (two) times daily.      . potassium chloride (MICRO-K) 10 MEQ CR capsule TAKE 2 CAPSULES BY MOUTH DAILY  180 capsule  0  . traMADol (ULTRAM) 50 MG tablet Take 50 mg by mouth every 6 (six) hours as needed for pain.      . Vitamin D, Ergocalciferol, (DRISDOL) 50000 UNITS CAPS Take 5,000 Units by  mouth 2 (two) times a week.       . warfarin (COUMADIN) 10 MG tablet Take 10 mg by mouth daily. Takes 7.5 mg along with a 10 mg tablet DAILY,, Wednesday and Friday takes 3.75 mg       No current facility-administered medications for this visit.    Allergies:    Allergies  Allergen Reactions  . Aspirin Itching  . Penicillins Rash    Social History:  The patient  reports that she has never smoked. She has never used smokeless tobacco. She reports that she does not drink alcohol or use illicit drugs.   Family History:  The patient's family history includes Hypertension in her daughter.   ROS:  Please see the history of present illness.  No nausea, vomiting.  No fevers, chills.  No focal weakness.  No dysuria.    All other systems reviewed and negative.   PHYSICAL EXAM: VS:  BP 122/74  Pulse 64  Ht 5\' 5"  (1.651 m)  Wt 223 lb (101.152 kg)  BMI 37.11 kg/m2 Well nourished, well developed, in no acute distress HEENT: normal Neck: no JVD, no carotid bruits Cardiac:  normal S1, S2; RRR;  Lungs:   clear to auscultation bilaterally, no wheezing, rhonchi or rales Abd: soft, nontender, no hepatomegaly Ext: no edema Skin: warm and dry Neuro:   no focal abnormalities noted  EKG:  NSR, prolonged PR, LBBB  ASSESSMENT AND PLAN:  Chronic diastolic heart failure  Increased Furosemide Tablet, 80 Milligram, TAKE 1 TABLET BY MOUTH TWICE DAILY OR AS DIRECTED Notes: May need to use Metolazone if refractory sx in the future. Check echocardiogram as well.  2. Essential hypertension, benign  Continue Atenolol Tablet, 50 MG, 1/2 tab, Orally, Once a day Notes: COntrolled.  3. Arrhythmia  Continue Amiodarone HCl Tablet, 200 MG, 1 tablet, Orally, Once a day Notes: No sx from SVT.  Need q six months LFTs, TSH.  Checked by Dr. Chalmers Cater per patient. 4. Bilateral leg edema  Notes:  Can use compression stockings. Elevate legs as much as possible.   5.  SHOB.  Better.  BNP 285 in 10/14- lasix was increased and sx are better.Weston Brass, Mina Marble, MD, [facility] 03/19/2014 10:21 AM

## 2014-04-03 ENCOUNTER — Other Ambulatory Visit (HOSPITAL_COMMUNITY): Payer: Self-pay | Admitting: Interventional Cardiology

## 2014-04-04 ENCOUNTER — Other Ambulatory Visit: Payer: Self-pay | Admitting: Interventional Cardiology

## 2014-05-19 ENCOUNTER — Other Ambulatory Visit: Payer: Self-pay | Admitting: Interventional Cardiology

## 2014-08-04 ENCOUNTER — Other Ambulatory Visit: Payer: Self-pay | Admitting: Interventional Cardiology

## 2014-09-03 ENCOUNTER — Other Ambulatory Visit: Payer: Self-pay | Admitting: Interventional Cardiology

## 2014-09-16 ENCOUNTER — Ambulatory Visit (INDEPENDENT_AMBULATORY_CARE_PROVIDER_SITE_OTHER): Payer: Medicare Other | Admitting: Interventional Cardiology

## 2014-09-16 VITALS — BP 130/80 | HR 60 | Ht 65.0 in | Wt 222.8 lb

## 2014-09-16 DIAGNOSIS — I10 Essential (primary) hypertension: Secondary | ICD-10-CM

## 2014-09-16 DIAGNOSIS — I471 Supraventricular tachycardia: Secondary | ICD-10-CM

## 2014-09-16 DIAGNOSIS — R609 Edema, unspecified: Secondary | ICD-10-CM

## 2014-09-16 DIAGNOSIS — I4891 Unspecified atrial fibrillation: Secondary | ICD-10-CM

## 2014-09-16 DIAGNOSIS — I5032 Chronic diastolic (congestive) heart failure: Secondary | ICD-10-CM

## 2014-09-16 NOTE — Patient Instructions (Signed)
Your physician recommends that you continue on your current medications as directed. Please refer to the Current Medication list given to you today. Your physician recommends that you return for lab work in: TODAY (CMET, TSH)  Your physician wants you to follow-up in: [place].   You will receive a reminder letter in the mail two months in advance. If you don't receive a letter, please call our office to schedule the follow-up appointment.

## 2014-09-16 NOTE — Progress Notes (Signed)
Patient ID: [name], female   DOB: 05/29/1933, 80 y.o.   MRN: [id number]    [place], [place], [place]  [address] Phone: [phone] Fax:  [phone]  Date:  09/16/2014   ID:  [name], DOB Dec 03, 1932, MRN [id number]  PCP:  Maximino Greenland, MD      History of Present Illness: [name] is a 80 y.o. female who has had diastolic dysfunction in the past. She is on Coumadin for DVT. She had some palpitations that correlate with the presyncope in the past. No sx like this recently. She has some LE edema which is better.  Not wearing compression stockings.  Her weight has gone up. Sugars have been poorly controlled.  She had a low BS reading early today and had to drink some juice.   Knee pain as well. She walks with a walker.   Elevates legs more.  She feels like his vision is unchanged in last few days. Her overall capacity for exercise has been reduced for quite some time.   Wt Readings from Last 3 Encounters:  09/16/14 222 lb 12.8 oz (101.061 kg)  03/19/14 223 lb (101.152 kg)  08/06/13 211 lb (95.709 kg)     Past Medical History  Diagnosis Date  . Thrombocytopenia 11/21/2011  . DVT (deep venous thrombosis)   . Dizziness and giddiness   . Acute diastolic heart failure     Current Outpatient Prescriptions  Medication Sig Dispense Refill  . amiodarone (PACERONE) 200 MG tablet TAKE 1 TABLET BY MOUTH DAILY 30 tablet 4  . atenolol (TENORMIN) 50 MG tablet Take 25 mg by mouth daily.    [name] atorvastatin (LIPITOR) 20 MG tablet Take 20 mg by mouth daily.    . calcium carbonate (OS-CAL) 600 MG TABS Take 600 mg by mouth 2 (two) times daily with a meal.    . Colesevelam HCl 3.75 G PACK Take 3.75 g by mouth daily.    [name] erythromycin ophthalmic ointment Administer to the right eye nightly. for 14 days    . ferrous gluconate (FERGON) 324 MG tablet Take 325 mg by mouth daily with breakfast.     . furosemide (LASIX) 80 MG tablet TAKE 1 TABLET BY MOUTH TWICE DAILY 60  tablet 1  . insulin aspart (NOVOLOG) 100 UNIT/ML injection Inject 6 Units into the skin 3 (three) times daily before meals. Sliding scale.  Patient doesn't have the information with her right now pertaining to the sliding scale.    . Insulin Detemir (LEVEMIR [place]) Inject 20 Units into the skin.    [name] losartan (COZAAR) 100 MG tablet TAKE 1 TABLET BY MOUTH ONCE A DAY 30 tablet 5  . magnesium oxide (MAG-OX) 400 MG tablet Take 400 mg by mouth daily.    [name] oxybutynin (DITROPAN) 5 MG tablet Take 5 mg by mouth 2 (two) times daily.    . potassium chloride (MICRO-K) 10 MEQ CR capsule TAKE 2 CAPSULE BY MOUTH DAILY 180 capsule 1  . traMADol (ULTRAM) 50 MG tablet Take 50 mg by mouth every 6 (six) hours as needed for pain.    [name] triamcinolone cream (KENALOG) 0.1 % Apply 1 application topically 2 (two) times daily.    . Vitamin D, Ergocalciferol, (DRISDOL) 50000 UNITS CAPS Take 5,000 Units by mouth 2 (two) times a week.     . warfarin (COUMADIN) 10 MG tablet Take 10 mg by mouth daily. Takes 7.5 mg along with a 10 mg tablet DAILY,,  Wednesday and Friday takes 3.75 mg     No current facility-administered medications for this visit.    Allergies:    Allergies  Allergen Reactions  . Aspirin Itching  . Penicillins Rash and Itching    Social History:  The patient  reports that she has never smoked. She has never used smokeless tobacco. She reports that she does not drink alcohol or use illicit drugs.   Family History:  The patient's family history includes Hypertension in her daughter.   ROS:  Please see the history of present illness.  No nausea, vomiting.  No fevers, chills.  No focal weakness.  No dysuria.    All other systems reviewed and negative.   PHYSICAL EXAM: VS:  BP 130/80 mmHg  Pulse 60  Ht 5\' 5"  (1.651 m)  Wt 222 lb 12.8 oz (101.061 kg)  BMI 37.08 kg/m2 Well nourished, well developed, in no acute distress HEENT: normal Neck: no JVD, no carotid bruits Cardiac:  normal S1, S2; RRR;  Lungs:   clear to auscultation bilaterally, no wheezing, rhonchi or rales Abd: soft, nontender, no hepatomegaly Ext: no edema Skin: warm and dry Neuro:   no focal abnormalities noted  EKG:  NSR, prolonged PR, LBBB  ASSESSMENT AND PLAN:  Chronic diastolic heart failure  Continue Furosemide Tablet, 80 Milligram, TAKE 1 TABLET BY MOUTH TWICE DAILY OR AS DIRECTED Notes: May need to use Metolazone if refractory sx in the future. Echocardiogram showed normal LV function in 11/14, moderate MR.  Change lasix based on renal function obtained today.  Cr stable.  COntinue current dose of Lasix.  2. Essential hypertension, benign  Continue Atenolol Tablet, 50 MG, 1/2 tab, Orally, Once a day Notes: COntrolled.  3. Arrhythmia  Continue Amiodarone HCl Tablet, 200 MG, 1 tablet, Orally, Once a day Notes: No sx from SVT.  Need q six months LFTs, TSH.  Will check today.  INR Checked by [name] per patient. 4. Bilateral leg edema  Notes:  Can use compression stockings. Elevate legs as much as possible.   5.  SHOB.  Better.  BNP 285 in 10/14- lasix was increased and sx are better.   Cr stable.  COntinue current dose of Lasix.     Signed, Mina Marble, MD, [facility] 09/16/2014 3:55 PM

## 2014-09-17 ENCOUNTER — Encounter: Payer: Self-pay | Admitting: Interventional Cardiology

## 2014-09-17 LAB — COMPREHENSIVE METABOLIC PANEL
ALT: 34 U/L (ref 0–35)
AST: 27 U/L (ref 0–37)
Albumin: 3.9 g/dL (ref 3.5–5.2)
Alkaline Phosphatase: 70 U/L (ref 39–117)
BUN: 37 mg/dL — ABNORMAL HIGH (ref 6–23)
CHLORIDE: 103 meq/L (ref 96–112)
CO2: 30 meq/L (ref 19–32)
Calcium: 8.9 mg/dL (ref 8.4–10.5)
Creatinine, Ser: 1.3 mg/dL — ABNORMAL HIGH (ref 0.4–1.2)
GFR: 50.74 mL/min — AB (ref >60.00)
Glucose, Bld: 131 mg/dL — ABNORMAL HIGH (ref 70–99)
Potassium: 4 mEq/L (ref 3.5–5.1)
Sodium: 141 mEq/L (ref 135–145)
TOTAL PROTEIN: 7.5 g/dL (ref 6.0–8.3)
Total Bilirubin: 0.6 mg/dL (ref 0.2–1.2)

## 2014-09-17 LAB — TSH: TSH: 1.2 u[IU]/mL (ref 0.35–4.50)

## 2014-09-30 ENCOUNTER — Other Ambulatory Visit: Payer: Self-pay | Admitting: Interventional Cardiology

## 2014-10-02 ENCOUNTER — Other Ambulatory Visit: Payer: Self-pay | Admitting: Interventional Cardiology

## 2014-10-02 ENCOUNTER — Encounter (HOSPITAL_COMMUNITY): Payer: Self-pay | Admitting: Internal Medicine

## 2014-11-28 ENCOUNTER — Other Ambulatory Visit: Payer: Self-pay | Admitting: Interventional Cardiology

## 2014-12-07 ENCOUNTER — Other Ambulatory Visit: Payer: Self-pay | Admitting: Interventional Cardiology

## 2014-12-08 NOTE — Telephone Encounter (Signed)
PATIENT NEEDS TO MAKE APPT FOR MORE REFILLS

## 2015-02-03 ENCOUNTER — Other Ambulatory Visit: Payer: Self-pay | Admitting: Interventional Cardiology

## 2015-06-11 ENCOUNTER — Other Ambulatory Visit: Payer: Self-pay | Admitting: Interventional Cardiology

## 2015-07-10 ENCOUNTER — Other Ambulatory Visit: Payer: Self-pay | Admitting: Interventional Cardiology

## 2015-07-14 ENCOUNTER — Other Ambulatory Visit: Payer: Self-pay | Admitting: Interventional Cardiology

## 2015-07-28 DIAGNOSIS — D6832 Hemorrhagic disorder due to extrinsic circulating anticoagulants: Secondary | ICD-10-CM | POA: Insufficient documentation

## 2015-07-28 DIAGNOSIS — W19XXXA Unspecified fall, initial encounter: Secondary | ICD-10-CM | POA: Insufficient documentation

## 2015-07-28 DIAGNOSIS — E1169 Type 2 diabetes mellitus with other specified complication: Secondary | ICD-10-CM | POA: Insufficient documentation

## 2015-07-28 DIAGNOSIS — T45515A Adverse effect of anticoagulants, initial encounter: Secondary | ICD-10-CM

## 2015-07-28 DIAGNOSIS — E119 Type 2 diabetes mellitus without complications: Secondary | ICD-10-CM | POA: Insufficient documentation

## 2015-08-11 ENCOUNTER — Other Ambulatory Visit: Payer: Self-pay | Admitting: Interventional Cardiology

## 2015-08-22 ENCOUNTER — Other Ambulatory Visit: Payer: Self-pay | Admitting: Adult Health

## 2015-08-24 ENCOUNTER — Other Ambulatory Visit: Payer: Self-pay | Admitting: Interventional Cardiology

## 2015-08-24 NOTE — Telephone Encounter (Signed)
Needs an appointment ASAP

## 2015-09-13 ENCOUNTER — Encounter: Payer: Self-pay | Admitting: Physician Assistant

## 2015-09-13 DIAGNOSIS — D649 Anemia, unspecified: Secondary | ICD-10-CM | POA: Insufficient documentation

## 2015-09-13 DIAGNOSIS — N1832 Chronic kidney disease, stage 3b: Secondary | ICD-10-CM | POA: Insufficient documentation

## 2015-09-13 DIAGNOSIS — I82409 Acute embolism and thrombosis of unspecified deep veins of unspecified lower extremity: Secondary | ICD-10-CM | POA: Insufficient documentation

## 2015-09-13 DIAGNOSIS — I10 Essential (primary) hypertension: Secondary | ICD-10-CM | POA: Insufficient documentation

## 2015-09-13 DIAGNOSIS — N183 Chronic kidney disease, stage 3 unspecified: Secondary | ICD-10-CM | POA: Insufficient documentation

## 2015-09-13 DIAGNOSIS — D61818 Other pancytopenia: Secondary | ICD-10-CM | POA: Insufficient documentation

## 2015-09-13 DIAGNOSIS — E669 Obesity, unspecified: Secondary | ICD-10-CM | POA: Insufficient documentation

## 2015-09-13 DIAGNOSIS — I447 Left bundle-branch block, unspecified: Secondary | ICD-10-CM | POA: Insufficient documentation

## 2015-09-13 NOTE — Progress Notes (Signed)
Cardiology Office Note Date:  09/14/2015  Patient ID:  [name], [name] 1933/03/28, MRN [id number] PCP:  Maximino Greenland, MD  Cardiologist: Dr. Irish Lack   Chief Complaint: feet swelling  History of Present Illness: [name] is a 81 y.o. female with history of h/o DVT (on Coumadin for this), chronic diastolic CHF, deconditioning, obesity, HTN, SVT (correlated with pre-syncope, on amiodarone), DM, chronic anemia/thrombocytopenia, LBBB, probable CKD stage III (Cr 1.3 in 08/2014), mod MR by echo 2014 who presents for f/u. Per notes in 2013 she had an EPS with ablation for SVT which did not eliminate the SVT completely. She also had bradycardia which limited medication. She was placed on amiodarone by Dr. Lovena Le. Last echo 07/2013: mild LVH, moderate focal basal hypertrophy of septum, EF 55-60%, grade 1 DD, mod MR, mod LAE, PASP 40.  She sustained a mechanical fall in 07/2015 and was in the hospital for a week then rehab until [place]. She has since been discharged and is doing PT/OT at home. She has noticed increase in her chronic edema. She and her daughter said she gained weight at SNF but her weight is actually down about 10lbs here compared to last year. Reports compliance with Lasix, low sodium diet, and does not drink excess fluid. UOP as expected. No CP, SOB, presyncope or syncope. INR is followed by [facility].   Past Medical History  Diagnosis Date  . Thrombocytopenia ([place]) 11/21/2011  . DVT (deep venous thrombosis) ([place])     a. On Coumadin for this.  . Dizziness and giddiness   . Chronic diastolic CHF (congestive heart failure) ([place])   . Muscular deconditioning   . Obesity   . Essential hypertension   . Diabetes mellitus ([place])   . SVT (supraventricular tachycardia) ([place])     a. In 2013 she had an EPS with ablation for SVT which did not eliminate the SVT completely. She also had bradycardia which limited medication. She was placed on amiodarone by Dr. Lovena Le.  .  Anemia   . LBBB (left bundle branch block)   . CKD (chronic kidney disease), stage III   . Mitral regurgitation     a. Mod MR by echo 2014.    Past Surgical History  Procedure Laterality Date  . Supraventricular tachycardia ablation N/A 10/11/2012    Procedure: SUPRAVENTRICULAR TACHYCARDIA ABLATION;  Surgeon: Evans Lance, MD;  Location: [facility] CATH LAB;  Service: Cardiovascular;  Laterality: N/A;    Current Outpatient Prescriptions  Medication Sig Dispense Refill  . amiodarone (PACERONE) 200 MG tablet TAKE 1 TABLET BY MOUTH EVERY DAY 30 tablet 0  . atenolol (TENORMIN) 50 MG tablet Take 25 mg by mouth daily.    [name] atorvastatin (LIPITOR) 20 MG tablet Take 20 mg by mouth daily.    . calcium carbonate (OS-CAL) 600 MG TABS Take 600 mg by mouth 2 (two) times daily with a meal.    . Colesevelam HCl 3.75 G PACK Take 3.75 g by mouth daily.    . ferrous gluconate (FERGON) 324 MG tablet Take 325 mg by mouth daily with breakfast.     . furosemide (LASIX) 80 MG tablet TAKE 1 TABLET BY MOUTH TWICE DAILY 60 tablet 10  . insulin aspart (NOVOLOG) 100 UNIT/ML injection Inject 6 Units into the skin 3 (three) times daily before meals. Sliding scale.  Patient doesn't have the information with her right now pertaining to the sliding scale.    . insulin detemir (LEVEMIR) 100 UNIT/ML injection Inject into  the skin at bedtime. Take 7 units in am  And 15 units pm    . Insulin Lispro (HUMALOG PEN [place]) Inject 3 Units into the skin 3 (three) times daily.    [name] losartan (COZAAR) 100 MG tablet TAKE 1 TABLET BY MOUTH EVERY DAY. NEEDS APPT 30 tablet 0  . magnesium oxide (MAG-OX) 400 MG tablet Take 400 mg by mouth daily.    [name] oxybutynin (DITROPAN) 5 MG tablet Take 5 mg by mouth 2 (two) times daily.    . potassium chloride (MICRO-K) 10 MEQ CR capsule TAKE 2 CAPSULES BY MOUTH DAILY 180 capsule 0  . traMADol (ULTRAM) 50 MG tablet Take 50 mg by mouth every 6 (six) hours as needed for pain.    [name] triamcinolone cream (KENALOG) 0.1  % Apply 1 application topically 2 (two) times daily.    . Vitamin D, Ergocalciferol, (DRISDOL) 50000 UNITS CAPS Take 5,000 Units by mouth 2 (two) times a week.     . warfarin (COUMADIN) 10 MG tablet Take 10 mg by mouth daily. Takes 7.5 mg along with a 10 mg tablet DAILY,, Wednesday and Friday takes 3.75 mg     No current facility-administered medications for this visit.    Allergies:   Aspirin and Penicillins   Social History:  The patient  reports that she has never smoked. She has never used smokeless tobacco. She reports that she does not drink alcohol or use illicit drugs.   Family History:  The patient's family history includes Heart attack in her brother; Hypertension in her daughter; Stroke in her mother and sister.  ROS:  Please see the history of present illness.  All other systems are reviewed and otherwise negative.   PHYSICAL EXAM:  VS:  BP 130/68 mmHg  Pulse 50  Ht 5\' 5"  (1.651 m)  Wt 213 lb (96.616 kg)  BMI 35.44 kg/m2 BMI: Body mass index is 35.44 kg/(m^2). Obese well-developed AAF in no acute distress HEENT: normocephalic, atraumatic Neck: no JVD, carotid bruits or masses Cardiac:  normal S1, S2; RRR; no murmurs, rubs, or gallops Lungs:  clear to auscultation bilaterally, no wheezing, rhonchi or rales Abd: soft, nontender, no hepatomegaly, + BS MS: no deformity or atrophy Ext: 2+ BLE edema, compression hose in place, no LE erythema, not weeping Skin: warm and dry, no rash Neuro:  moves all extremities spontaneously, no focal abnormalities noted, follows commands Psych: euthymic mood, full affect   EKG:  Done today shows SB 50bpm, 1st degree AVB, LBBB, no sig change from prior  Recent Labs: 09/16/2014: ALT 34; BUN 37*; Creatinine, Ser 1.3*; Potassium 4.0; Sodium 141; TSH 1.20  No results found for requested labs within last 365 days.   CrCl cannot be calculated (Patient has no serum creatinine result on file.).   Wt Readings from Last 3 Encounters:  09/14/15  213 lb (96.616 kg)  09/16/14 222 lb 12.8 oz (101.061 kg)  03/19/14 223 lb (101.152 kg)     Other studies reviewed: Additional studies/records reviewed today include: summarized above  ASSESSMENT AND PLAN:  1. Edema likely indicative of a/c diastolic CHF - acute precipitant not clear. She reports compliance with Lasix, sodium/fluid restriction and is wearing compression hose. She reports edema never goes fully down but it's worse lately. Will check CMET (to include albumin, Cr), CBC, BNP, TSH to further evaluate. If renal function is stable will plan to instruct her to take an extra dose of Lasix for several days. DVT less likely given that she is  on Coumadin. 2. Essential HTN - controlled. 3. Mitral regurgitation - moderate by echo in 2014. No significant increased murmur on exam today. If edema remains would consider updating 2D echo. 4. CKD III - f/u BMET before increasing Lasix. 5. H/o anemia/thrombcytopenia - f/u CBC today to ensure stability. 6. SVT - recheck LFTs/TSH since she is on amio. Further monitoring of organ systems while on amiodarone will be at discretion of primary cardiologist.  Disposition: F/u with Dr. Irish Lack in 6 months.  Current medicines are reviewed at length with the patient today.  The patient did not have any concerns regarding medicines.  Raechel Ache PA-C 09/14/2015 10:17 AM     [facility] [place] [address] [phone] (office)  [phone] (fax)

## 2015-09-14 ENCOUNTER — Encounter: Payer: Self-pay | Admitting: Physician Assistant

## 2015-09-14 ENCOUNTER — Ambulatory Visit (INDEPENDENT_AMBULATORY_CARE_PROVIDER_SITE_OTHER): Payer: Medicare Other | Admitting: Physician Assistant

## 2015-09-14 VITALS — BP 130/68 | HR 50 | Ht 65.0 in | Wt 213.0 lb

## 2015-09-14 DIAGNOSIS — I340 Nonrheumatic mitral (valve) insufficiency: Secondary | ICD-10-CM | POA: Diagnosis not present

## 2015-09-14 DIAGNOSIS — I5033 Acute on chronic diastolic (congestive) heart failure: Secondary | ICD-10-CM

## 2015-09-14 DIAGNOSIS — D649 Anemia, unspecified: Secondary | ICD-10-CM

## 2015-09-14 DIAGNOSIS — I10 Essential (primary) hypertension: Secondary | ICD-10-CM

## 2015-09-14 DIAGNOSIS — I471 Supraventricular tachycardia: Secondary | ICD-10-CM

## 2015-09-14 DIAGNOSIS — N183 Chronic kidney disease, stage 3 unspecified: Secondary | ICD-10-CM

## 2015-09-14 DIAGNOSIS — I5032 Chronic diastolic (congestive) heart failure: Secondary | ICD-10-CM

## 2015-09-14 DIAGNOSIS — R600 Localized edema: Secondary | ICD-10-CM | POA: Diagnosis not present

## 2015-09-14 LAB — CBC WITH DIFFERENTIAL/PLATELET
BASOS PCT: 0 % (ref 0–1)
Basophils Absolute: 0 10*3/uL (ref 0.0–0.1)
Eosinophils Absolute: 0.1 10*3/uL (ref 0.0–0.7)
Eosinophils Relative: 3 % (ref 0–5)
HEMATOCRIT: 32.2 % — AB (ref 36.0–46.0)
HEMOGLOBIN: 10.5 g/dL — AB (ref 12.0–15.0)
Lymphocytes Relative: 31 % (ref 12–46)
Lymphs Abs: 1.1 10*3/uL (ref 0.7–4.0)
MCH: 30.1 pg (ref 26.0–34.0)
MCHC: 32.6 g/dL (ref 30.0–36.0)
MCV: 92.3 fL (ref 78.0–100.0)
MONO ABS: 0.4 10*3/uL (ref 0.1–1.0)
MONOS PCT: 11 % (ref 3–12)
MPV: 12.3 fL (ref 8.6–12.4)
NEUTROS ABS: 2 10*3/uL (ref 1.7–7.7)
Neutrophils Relative %: 55 % (ref 43–77)
Platelets: 156 10*3/uL (ref 150–400)
RBC: 3.49 MIL/uL — ABNORMAL LOW (ref 3.87–5.11)
RDW: 14.8 % (ref 11.5–15.5)
WBC: 3.6 10*3/uL — ABNORMAL LOW (ref 4.0–10.5)

## 2015-09-14 LAB — COMPREHENSIVE METABOLIC PANEL
ALK PHOS: 66 U/L (ref 33–130)
ALT: 17 U/L (ref 6–29)
AST: 21 U/L (ref 10–35)
Albumin: 3.9 g/dL (ref 3.6–5.1)
BUN: 25 mg/dL (ref 7–25)
CO2: 30 mmol/L (ref 20–31)
CREATININE: 1.3 mg/dL — AB (ref 0.60–0.88)
Calcium: 9 mg/dL (ref 8.6–10.4)
Chloride: 104 mmol/L (ref 98–110)
GLUCOSE: 44 mg/dL — AB (ref 65–99)
POTASSIUM: 3.6 mmol/L (ref 3.5–5.3)
SODIUM: 143 mmol/L (ref 135–146)
Total Bilirubin: 0.5 mg/dL (ref 0.2–1.2)
Total Protein: 7.3 g/dL (ref 6.1–8.1)

## 2015-09-14 LAB — TSH: TSH: 1 u[IU]/mL (ref 0.350–4.500)

## 2015-09-14 LAB — BRAIN NATRIURETIC PEPTIDE: Brain Natriuretic Peptide: 253.2 pg/mL — ABNORMAL HIGH (ref 0.0–100.0)

## 2015-09-14 NOTE — Progress Notes (Signed)
Quick Note:  Please call patient. BNP mildly elevated - suspect a combination of diastolic CHF and venous insufficiency contributing to her edema. Prior notes seem to indicate she always has some degree of fluctuating swelling on board. Please have her add an extra 1/2 tablet of Lasix in the afternoon daily (so 80mg  QAM/40mg  noon/80mg  QPM) for 3 days to see if this helps then go back to usual dosing. She should use compression hose and elevate legs as much as possible. If edema does not improve, she should call us back at which time I would recommend continuing the extra dose of Lasix until we can see her back in clinic next week with a BMET.  Avoid salt and high sodium foods at Thanksgiving as this likely will only make things worse. Other notable findings: hemoglobin and WBC remain on the low side, should f/u PCP for these. Kidney function is stable in chronic kidney disease stage 3. Her blood sugar was quite low on today's labs (44) so needs to make sure she's eating regular healthy meals. Thanks. Rhyanna Sorce PA-C    ______

## 2015-09-14 NOTE — Patient Instructions (Signed)
Medication Instructions:  Your physician recommends that you continue on your current medications as directed. Please refer to the Current Medication list given to you today.   Labwork: TODAY:  BMET                CBC W/DIFF                TSH                BNP  Testing/Procedures: None ordered  Follow-Up: Your physician wants you to follow-up in: [place] DR. VARANASI.  You will receive a reminder letter in the mail two months in advance. If you don't receive a letter, please call our office to schedule the follow-up appointment.   Any Other Special Instructions Will Be Listed Below (If Applicable).     If you need a refill on your cardiac medications before your next appointment, please call your pharmacy.

## 2015-09-15 ENCOUNTER — Telehealth: Payer: Self-pay

## 2015-09-15 DIAGNOSIS — R899 Unspecified abnormal finding in specimens from other organs, systems and tissues: Secondary | ICD-10-CM

## 2015-09-15 NOTE — Telephone Encounter (Signed)
Called patient about lab results. Per Melina Copa PA, Please call patient. BNP mildly elevated - suspect a combination of diastolic CHF and venous insufficiency contributing to her edema. Prior notes seem to indicate she always has some degree of fluctuating swelling on board. Please have her add an extra 1/2 tablet of Lasix in the afternoon daily (so 80mg  QAM/40mg  noon/80mg  QPM) for 3 days to see if this helps then go back to usual dosing. She should use compression hose and elevate legs as much as possible. If edema does not improve, she should call us back at which time I would recommend continuing the extra dose of Lasix until we can see her back in clinic next week with a BMET. Avoid salt and high sodium foods at Thanksgiving as this likely will only make things worse. Other notable findings: hemoglobin and WBC remain on the low side, should f/u PCP for these. Kidney function is stable in chronic kidney disease stage 3. Her blood sugar was quite low on today's labs (44) so needs to make sure she's eating regular healthy meals. Patient verbalized understanding.

## 2015-09-22 ENCOUNTER — Other Ambulatory Visit (INDEPENDENT_AMBULATORY_CARE_PROVIDER_SITE_OTHER): Payer: Medicare Other

## 2015-09-22 DIAGNOSIS — R899 Unspecified abnormal finding in specimens from other organs, systems and tissues: Secondary | ICD-10-CM

## 2015-09-22 LAB — BASIC METABOLIC PANEL
BUN: 31 mg/dL — ABNORMAL HIGH (ref 7–25)
CHLORIDE: 101 mmol/L (ref 98–110)
CO2: 30 mmol/L (ref 20–31)
CREATININE: 1.45 mg/dL — AB (ref 0.60–0.88)
Calcium: 9.1 mg/dL (ref 8.6–10.4)
Glucose, Bld: 191 mg/dL — ABNORMAL HIGH (ref 65–99)
POTASSIUM: 3.8 mmol/L (ref 3.5–5.3)
SODIUM: 139 mmol/L (ref 135–146)

## 2015-10-15 ENCOUNTER — Other Ambulatory Visit: Payer: Self-pay | Admitting: Interventional Cardiology

## 2015-10-15 NOTE — Telephone Encounter (Signed)
amiodarone (PACERONE) 200 MG tablet  Medication   Date: 08/11/2015  Department: [facility]  Ordering/Authorizing: Jettie Booze, MD      Order Providers    Prescribing Provider Encounter Provider   Jettie Booze, MD Jettie Booze, MD    Medication Detail      Disp Refills Start End     amiodarone (PACERONE) 200 MG tablet 30 tablet 0 08/11/2015     Sig: TAKE 1 TABLET BY MOUTH EVERY DAY    Notes to Pharmacy: Patient needs an appointment for further refills    E-Prescribing Status: Receipt confirmed by pharmacy (08/11/2015 2:31 PM EDT)     Pharmacy    [id number] - [place], [place] - [address] AT [address]   1st attempt, pt was seen in nov 2016

## 2015-10-16 ENCOUNTER — Other Ambulatory Visit: Payer: Self-pay | Admitting: *Deleted

## 2015-10-16 ENCOUNTER — Other Ambulatory Visit: Payer: Self-pay | Admitting: Interventional Cardiology

## 2015-10-16 MED ORDER — LOSARTAN POTASSIUM 100 MG PO TABS: 100.0000 mg | ORAL_TABLET | Freq: Every day | ORAL | Status: DC

## 2015-10-30 ENCOUNTER — Other Ambulatory Visit: Payer: Self-pay | Admitting: Interventional Cardiology

## 2015-12-22 ENCOUNTER — Other Ambulatory Visit: Payer: Self-pay | Admitting: Interventional Cardiology

## 2015-12-24 DIAGNOSIS — Z7901 Long term (current) use of anticoagulants: Secondary | ICD-10-CM | POA: Diagnosis not present

## 2015-12-24 DIAGNOSIS — D6832 Hemorrhagic disorder due to extrinsic circulating anticoagulants: Secondary | ICD-10-CM | POA: Diagnosis not present

## 2015-12-24 DIAGNOSIS — Z9181 History of falling: Secondary | ICD-10-CM | POA: Diagnosis not present

## 2015-12-24 DIAGNOSIS — I10 Essential (primary) hypertension: Secondary | ICD-10-CM | POA: Diagnosis not present

## 2015-12-24 DIAGNOSIS — E119 Type 2 diabetes mellitus without complications: Secondary | ICD-10-CM | POA: Diagnosis not present

## 2015-12-24 DIAGNOSIS — Z794 Long term (current) use of insulin: Secondary | ICD-10-CM | POA: Diagnosis not present

## 2015-12-24 DIAGNOSIS — R2689 Other abnormalities of gait and mobility: Secondary | ICD-10-CM | POA: Diagnosis not present

## 2015-12-28 DIAGNOSIS — I10 Essential (primary) hypertension: Secondary | ICD-10-CM | POA: Diagnosis not present

## 2015-12-28 DIAGNOSIS — Z9181 History of falling: Secondary | ICD-10-CM | POA: Diagnosis not present

## 2015-12-28 DIAGNOSIS — Z794 Long term (current) use of insulin: Secondary | ICD-10-CM | POA: Diagnosis not present

## 2015-12-28 DIAGNOSIS — R2689 Other abnormalities of gait and mobility: Secondary | ICD-10-CM | POA: Diagnosis not present

## 2015-12-28 DIAGNOSIS — D6832 Hemorrhagic disorder due to extrinsic circulating anticoagulants: Secondary | ICD-10-CM | POA: Diagnosis not present

## 2015-12-28 DIAGNOSIS — Z7901 Long term (current) use of anticoagulants: Secondary | ICD-10-CM | POA: Diagnosis not present

## 2015-12-28 DIAGNOSIS — E119 Type 2 diabetes mellitus without complications: Secondary | ICD-10-CM | POA: Diagnosis not present

## 2015-12-31 DIAGNOSIS — Z794 Long term (current) use of insulin: Secondary | ICD-10-CM | POA: Diagnosis not present

## 2015-12-31 DIAGNOSIS — D6832 Hemorrhagic disorder due to extrinsic circulating anticoagulants: Secondary | ICD-10-CM | POA: Diagnosis not present

## 2015-12-31 DIAGNOSIS — Z7901 Long term (current) use of anticoagulants: Secondary | ICD-10-CM | POA: Diagnosis not present

## 2015-12-31 DIAGNOSIS — E119 Type 2 diabetes mellitus without complications: Secondary | ICD-10-CM | POA: Diagnosis not present

## 2015-12-31 DIAGNOSIS — Z9181 History of falling: Secondary | ICD-10-CM | POA: Diagnosis not present

## 2015-12-31 DIAGNOSIS — I10 Essential (primary) hypertension: Secondary | ICD-10-CM | POA: Diagnosis not present

## 2015-12-31 DIAGNOSIS — R2689 Other abnormalities of gait and mobility: Secondary | ICD-10-CM | POA: Diagnosis not present

## 2016-01-04 DIAGNOSIS — E119 Type 2 diabetes mellitus without complications: Secondary | ICD-10-CM | POA: Diagnosis not present

## 2016-01-04 DIAGNOSIS — Z794 Long term (current) use of insulin: Secondary | ICD-10-CM | POA: Diagnosis not present

## 2016-01-04 DIAGNOSIS — R2689 Other abnormalities of gait and mobility: Secondary | ICD-10-CM | POA: Diagnosis not present

## 2016-01-04 DIAGNOSIS — D6832 Hemorrhagic disorder due to extrinsic circulating anticoagulants: Secondary | ICD-10-CM | POA: Diagnosis not present

## 2016-01-04 DIAGNOSIS — I10 Essential (primary) hypertension: Secondary | ICD-10-CM | POA: Diagnosis not present

## 2016-01-04 DIAGNOSIS — Z7901 Long term (current) use of anticoagulants: Secondary | ICD-10-CM | POA: Diagnosis not present

## 2016-01-04 DIAGNOSIS — Z9181 History of falling: Secondary | ICD-10-CM | POA: Diagnosis not present

## 2016-01-07 DIAGNOSIS — I10 Essential (primary) hypertension: Secondary | ICD-10-CM | POA: Diagnosis not present

## 2016-01-07 DIAGNOSIS — R2689 Other abnormalities of gait and mobility: Secondary | ICD-10-CM | POA: Diagnosis not present

## 2016-01-07 DIAGNOSIS — D6832 Hemorrhagic disorder due to extrinsic circulating anticoagulants: Secondary | ICD-10-CM | POA: Diagnosis not present

## 2016-01-07 DIAGNOSIS — E119 Type 2 diabetes mellitus without complications: Secondary | ICD-10-CM | POA: Diagnosis not present

## 2016-01-07 DIAGNOSIS — Z7901 Long term (current) use of anticoagulants: Secondary | ICD-10-CM | POA: Diagnosis not present

## 2016-01-07 DIAGNOSIS — Z794 Long term (current) use of insulin: Secondary | ICD-10-CM | POA: Diagnosis not present

## 2016-01-07 DIAGNOSIS — Z9181 History of falling: Secondary | ICD-10-CM | POA: Diagnosis not present

## 2016-01-08 DIAGNOSIS — Z7901 Long term (current) use of anticoagulants: Secondary | ICD-10-CM | POA: Diagnosis not present

## 2016-01-08 DIAGNOSIS — D6832 Hemorrhagic disorder due to extrinsic circulating anticoagulants: Secondary | ICD-10-CM | POA: Diagnosis not present

## 2016-01-08 DIAGNOSIS — Z794 Long term (current) use of insulin: Secondary | ICD-10-CM | POA: Diagnosis not present

## 2016-01-08 DIAGNOSIS — E119 Type 2 diabetes mellitus without complications: Secondary | ICD-10-CM | POA: Diagnosis not present

## 2016-01-08 DIAGNOSIS — Z9181 History of falling: Secondary | ICD-10-CM | POA: Diagnosis not present

## 2016-01-08 DIAGNOSIS — I10 Essential (primary) hypertension: Secondary | ICD-10-CM | POA: Diagnosis not present

## 2016-01-08 DIAGNOSIS — R2689 Other abnormalities of gait and mobility: Secondary | ICD-10-CM | POA: Diagnosis not present

## 2016-01-11 DIAGNOSIS — Z794 Long term (current) use of insulin: Secondary | ICD-10-CM | POA: Diagnosis not present

## 2016-01-11 DIAGNOSIS — Z7901 Long term (current) use of anticoagulants: Secondary | ICD-10-CM | POA: Diagnosis not present

## 2016-01-11 DIAGNOSIS — I10 Essential (primary) hypertension: Secondary | ICD-10-CM | POA: Diagnosis not present

## 2016-01-11 DIAGNOSIS — R2689 Other abnormalities of gait and mobility: Secondary | ICD-10-CM | POA: Diagnosis not present

## 2016-01-11 DIAGNOSIS — E119 Type 2 diabetes mellitus without complications: Secondary | ICD-10-CM | POA: Diagnosis not present

## 2016-01-11 DIAGNOSIS — D6832 Hemorrhagic disorder due to extrinsic circulating anticoagulants: Secondary | ICD-10-CM | POA: Diagnosis not present

## 2016-01-11 DIAGNOSIS — Z9181 History of falling: Secondary | ICD-10-CM | POA: Diagnosis not present

## 2016-01-14 ENCOUNTER — Telehealth: Payer: Self-pay | Admitting: Interventional Cardiology

## 2016-01-14 DIAGNOSIS — R2689 Other abnormalities of gait and mobility: Secondary | ICD-10-CM | POA: Diagnosis not present

## 2016-01-14 DIAGNOSIS — E119 Type 2 diabetes mellitus without complications: Secondary | ICD-10-CM | POA: Diagnosis not present

## 2016-01-14 DIAGNOSIS — Z9181 History of falling: Secondary | ICD-10-CM | POA: Diagnosis not present

## 2016-01-14 DIAGNOSIS — Z7901 Long term (current) use of anticoagulants: Secondary | ICD-10-CM | POA: Diagnosis not present

## 2016-01-14 DIAGNOSIS — Z794 Long term (current) use of insulin: Secondary | ICD-10-CM | POA: Diagnosis not present

## 2016-01-14 DIAGNOSIS — D6832 Hemorrhagic disorder due to extrinsic circulating anticoagulants: Secondary | ICD-10-CM | POA: Diagnosis not present

## 2016-01-14 DIAGNOSIS — I10 Essential (primary) hypertension: Secondary | ICD-10-CM | POA: Diagnosis not present

## 2016-01-14 NOTE — Telephone Encounter (Signed)
New Message  Pt weight has gone up about 6 lbs in the last two weeks. Swelling in both legs  Pt c/o swelling: STAT is pt has developed SOB within 24 hours  1. How long have you been experiencing swelling? 2 weeks ( Off and on) gotten worse over the last 4-5 days   2. Where is the swelling located? In Both legs   3.  Are you currently taking a "fluid pill"? Yes ( 80 mg lasix and has not missed a dose)   4.  Are you currently SOB?  A little on exertion   5.  Have you traveled recently? No   Havery Moros with [facility] called for the pt. She states that she will send a fax of weights and please call the pt back to discuss further

## 2016-01-14 NOTE — Telephone Encounter (Signed)
Daughter called back and scheduled appt for 3/28 with Lyda Jester, PA-C.

## 2016-01-14 NOTE — Telephone Encounter (Signed)
Pt states that she has bilateral LE edema with right leg being worse than left. Pt states that she does have SOB on exertion that resolves quickly once she sits down. SOB is worse than normal though. Denies salt to her diet other than occasionally has salmon with salt. Denies missing any doses of Lasix 80mg  BID.  BP yesterday with [facility] nurse was 170/80. Offered appt but pt states that she has to call her daughter and see when she would be able to bring her. Pt will call back with this information. Will route this message to Dr. Irish Lack for review in the meantime.

## 2016-01-15 DIAGNOSIS — Z7901 Long term (current) use of anticoagulants: Secondary | ICD-10-CM | POA: Diagnosis not present

## 2016-01-15 DIAGNOSIS — E119 Type 2 diabetes mellitus without complications: Secondary | ICD-10-CM | POA: Diagnosis not present

## 2016-01-15 DIAGNOSIS — R2689 Other abnormalities of gait and mobility: Secondary | ICD-10-CM | POA: Diagnosis not present

## 2016-01-15 DIAGNOSIS — Z9181 History of falling: Secondary | ICD-10-CM | POA: Diagnosis not present

## 2016-01-15 DIAGNOSIS — I10 Essential (primary) hypertension: Secondary | ICD-10-CM | POA: Diagnosis not present

## 2016-01-15 DIAGNOSIS — D6832 Hemorrhagic disorder due to extrinsic circulating anticoagulants: Secondary | ICD-10-CM | POA: Diagnosis not present

## 2016-01-15 DIAGNOSIS — Z794 Long term (current) use of insulin: Secondary | ICD-10-CM | POA: Diagnosis not present

## 2016-01-15 MED ORDER — AMLODIPINE BESYLATE 5 MG PO TABS: 5.0000 mg | ORAL_TABLET | Freq: Every day | ORAL | Status: DC

## 2016-01-15 NOTE — Telephone Encounter (Signed)
Can start amlodipine 5 mg daily to help with BP.  Elevate legs for swelling.

## 2016-01-15 NOTE — Telephone Encounter (Signed)
**Note De-Identified  Obfuscation** The pt is advised and she verbalized understanding and is in agreement with paln. Per her request I have sent Amlodipine 5mg  RX to [facility] in [place] to fill.

## 2016-01-18 DIAGNOSIS — E119 Type 2 diabetes mellitus without complications: Secondary | ICD-10-CM | POA: Diagnosis not present

## 2016-01-18 DIAGNOSIS — I10 Essential (primary) hypertension: Secondary | ICD-10-CM | POA: Diagnosis not present

## 2016-01-18 DIAGNOSIS — Z9181 History of falling: Secondary | ICD-10-CM | POA: Diagnosis not present

## 2016-01-18 DIAGNOSIS — R2689 Other abnormalities of gait and mobility: Secondary | ICD-10-CM | POA: Diagnosis not present

## 2016-01-18 DIAGNOSIS — D6832 Hemorrhagic disorder due to extrinsic circulating anticoagulants: Secondary | ICD-10-CM | POA: Diagnosis not present

## 2016-01-18 DIAGNOSIS — Z7901 Long term (current) use of anticoagulants: Secondary | ICD-10-CM | POA: Diagnosis not present

## 2016-01-18 DIAGNOSIS — Z794 Long term (current) use of insulin: Secondary | ICD-10-CM | POA: Diagnosis not present

## 2016-01-19 ENCOUNTER — Ambulatory Visit (INDEPENDENT_AMBULATORY_CARE_PROVIDER_SITE_OTHER): Payer: Medicare Other | Admitting: Cardiology

## 2016-01-19 ENCOUNTER — Encounter: Payer: Self-pay | Admitting: Cardiology

## 2016-01-19 VITALS — BP 150/76 | HR 60 | Ht 65.0 in | Wt 213.1 lb

## 2016-01-19 DIAGNOSIS — R635 Abnormal weight gain: Secondary | ICD-10-CM

## 2016-01-19 DIAGNOSIS — Z9181 History of falling: Secondary | ICD-10-CM | POA: Diagnosis not present

## 2016-01-19 DIAGNOSIS — I340 Nonrheumatic mitral (valve) insufficiency: Secondary | ICD-10-CM

## 2016-01-19 DIAGNOSIS — I471 Supraventricular tachycardia: Secondary | ICD-10-CM

## 2016-01-19 DIAGNOSIS — I4891 Unspecified atrial fibrillation: Secondary | ICD-10-CM

## 2016-01-19 DIAGNOSIS — I10 Essential (primary) hypertension: Secondary | ICD-10-CM

## 2016-01-19 DIAGNOSIS — R600 Localized edema: Secondary | ICD-10-CM

## 2016-01-19 DIAGNOSIS — I5033 Acute on chronic diastolic (congestive) heart failure: Secondary | ICD-10-CM

## 2016-01-19 LAB — BASIC METABOLIC PANEL
BUN: 28 mg/dL — ABNORMAL HIGH (ref 7–25)
CHLORIDE: 102 mmol/L (ref 98–110)
CO2: 31 mmol/L (ref 20–31)
Calcium: 9.2 mg/dL (ref 8.6–10.4)
Creat: 1.5 mg/dL — ABNORMAL HIGH (ref 0.60–0.88)
Glucose, Bld: 185 mg/dL — ABNORMAL HIGH (ref 65–99)
POTASSIUM: 4.1 mmol/L (ref 3.5–5.3)
Sodium: 141 mmol/L (ref 135–146)

## 2016-01-19 LAB — BRAIN NATRIURETIC PEPTIDE: Brain Natriuretic Peptide: 178.7 pg/mL — ABNORMAL HIGH (ref <100)

## 2016-01-19 NOTE — Patient Instructions (Addendum)
Medication Instructions:  Your physician has recommended you make the following change in your medication:  WE WILL CALL YOU WITH YOUR MEDICATION CHANGES ONCE WE GET YOUR LAB RESULTS BACK   Labwork: TODAY:  BMET & BNP  Testing/Procedures: NONE ORDERED  Follow-Up: Your physician recommends that you schedule a follow-up appointment in: 2 MONTHS WITH DR. VARANASI   Any Other Special Instructions Will Be Listed Below (If Applicable).    If you need a refill on your cardiac medications before your next appointment, please call your pharmacy.

## 2016-01-19 NOTE — Progress Notes (Signed)
01/19/2016 [name]   [birth year]  [id number]  Primary Physician Maximino Greenland, MD Primary Cardiologist: Dr. Irish Lack Electrophysiologist: Dr. Rayann Heman  Reason for Visit/CC: DOE and LEE  HPI:  82 y.o. female with history of h/o DVT (on Coumadin for this), chronic diastolic CHF, deconditioning, obesity, HTN, SVT (correlated with pre-syncope, on amiodarone), IDDM, chronic anemia/thrombocytopenia, LBBB, probable CKD stage III (Cr 1.3 in 08/2014), mod MR by echo 2014 who presents for f/u. Per notes in 2013 she had an EPS with ablation for SVT which did not eliminate the SVT completely. She also had bradycardia which limited medication. She was placed on amiodarone by Dr. Lovena Le. Last echo 07/2013: mild LVH, moderate focal basal hypertrophy of septum, EF 55-60%, grade 1 DD, mod MR, mod LAE, PASP 40.  She presents to clinic today as advised by [facility]. [employer] [facility] RN called stating patient had gained 6 lb in a 2 week period. Her normal weight is 208-209 lb. Her weight today in clinic is 213. She states she has chronic bilateral LEE (thought to be due to venous insufficiency), however she has noticed mild increase in edema over the last week. She notes mild DOE with prolonged walking, but not with short distances. She denies resting dyspnea. No orthopnea or PND. She sleeps with only 1 pillow. She reports full compliance with lasix and notes that her UOP has  Not decreased. She is unsure about her sodium intake, as she does not cook her own meals. She has a HH aid and her daughter also performs most of the cooking. She denies CP. No palpitations.     Current Outpatient Prescriptions  Medication Sig Dispense Refill  . amiodarone (PACERONE) 200 MG tablet TAKE ONE TABLET BY MOUTH DAILY 90 tablet 3  . amLODipine (NORVASC) 5 MG tablet Take 1 tablet (5 mg total) by mouth daily. 30 tablet 3  . atenolol (TENORMIN) 50 MG tablet Take 25 mg by mouth daily.    [name]  atorvastatin (LIPITOR) 20 MG tablet Take 20 mg by mouth daily.    . calcium carbonate (OS-CAL) 600 MG TABS Take 600 mg by mouth 2 (two) times daily with a meal.    . Colesevelam HCl 3.75 G PACK Take 3.75 g by mouth daily.    . ferrous gluconate (FERGON) 324 MG tablet Take 325 mg by mouth daily with breakfast.     . furosemide (LASIX) 80 MG tablet TAKE 1 TABLET BY MOUTH TWICE DAILY 60 tablet 9  . gabapentin (NEURONTIN) 100 MG capsule     . insulin aspart (NOVOLOG) 100 UNIT/ML injection Inject 6 Units into the skin 3 (three) times daily before meals. Sliding scale.  Patient doesn't have the information with her right now pertaining to the sliding scale.    . insulin detemir (LEVEMIR) 100 UNIT/ML injection Inject into the skin at bedtime. Take 9 units in am  And 12 units pm    . Insulin Lispro (HUMALOG PEN [place]) Inject 3 Units into the skin 3 (three) times daily.    [name] losartan (COZAAR) 100 MG tablet Take 1 tablet (100 mg total) by mouth daily. 90 tablet 1  . magnesium oxide (MAG-OX) 400 MG tablet Take 400 mg by mouth daily.    [name] oxybutynin (DITROPAN) 5 MG tablet Take 5 mg by mouth 2 (two) times daily.    . potassium chloride (MICRO-K) 10 MEQ CR capsule TAKE 2 CAPSULES BY MOUTH DAILY 180 capsule 1  . traMADol (ULTRAM)  50 MG tablet Take 50 mg by mouth every 6 (six) hours as needed for pain.    . Vitamin D, Ergocalciferol, (DRISDOL) 50000 UNITS CAPS Take 5,000 Units by mouth 2 (two) times a week.     . warfarin (COUMADIN) 10 MG tablet Take 10 mg by mouth daily. Takes 7.5 mg along with a 10 mg tablet DAILY,, TEUSDAY and THURSDAY takes 3.75 mg     No current facility-administered medications for this visit.    Allergies  Allergen Reactions  . Aspirin Itching  . Penicillins Rash and Itching    Social History   Social History  . Marital Status: Widowed    Spouse Name: N/A  . Number of Children: N/A  . Years of Education: N/A   Occupational History  . Not on file.   Social History Main Topics   . Smoking status: Never Smoker   . Smokeless tobacco: Never Used  . Alcohol Use: No  . Drug Use: No  . Sexual Activity: Not on file   Other Topics Concern  . Not on file   Social History Narrative     Review of Systems: General: negative for chills, fever, night sweats or weight changes.  Cardiovascular: negative for chest pain, dyspnea on exertion, edema, orthopnea, palpitations, paroxysmal nocturnal dyspnea or shortness of breath Dermatological: negative for rash Respiratory: negative for cough or wheezing Urologic: negative for hematuria Abdominal: negative for nausea, vomiting, diarrhea, bright red blood per rectum, melena, or hematemesis Neurologic: negative for visual changes, syncope, or dizziness All other systems reviewed and are otherwise negative except as noted above.    Blood pressure 150/76, pulse 60, height 5\' 5"  (1.651 m), weight 213 lb 1.9 oz (96.671 kg).  General appearance: alert, cooperative and no distress Neck: no carotid bruit and no JVD Lungs: clear to auscultation bilaterally Heart: regular rate and rhythm, S1, S2 normal, no murmur, click, rub or gallop Extremities: chronic bilateral LEE, no pitting edema Pulses: 2+ and symmetric Skin: warm and dry Neurologic: Grossly normal  EKG SR. Chronic LBBB. 62 bpm  ASSESSMENT AND PLAN:   1. Chronic Diastolic HF: patient notes mild DOE with prolonged walking, but not with short distances. She denies resting dyspnea. No orthopnea or PND. She sleeps with only 1 pillow. She reports full compliance with lasix and notes that her UOP  has not changed. She is unsure about her sodium intake, as she does no cook her own meals. She has a HH aid and her daughter also performs most of the cooking. She denies CP. No palpitations. Her breathing appears stable in clinic today. Lung exam is w/o rales. We will check a BNP today as well as a BMP to assess renal function and K. When results return, we will notify patient of any  recommended dosing adjustments to her diuretic. We discussed the importance of continued daily lasix, daily weights and low sodium diet. She is to notify our office if any worsening symptoms or changes in respiratory status. Keep f/u with Dr. Irish Lack in May.    Lyda Jester PA-C 01/19/2016 11:35 AM

## 2016-01-20 ENCOUNTER — Telehealth: Payer: Self-pay | Admitting: *Deleted

## 2016-01-20 MED ORDER — FUROSEMIDE 80 MG PO TABS: ORAL_TABLET | ORAL | Status: DC

## 2016-01-20 MED ORDER — POTASSIUM CHLORIDE ER 10 MEQ PO CPCR: ORAL_CAPSULE | ORAL | Status: DC

## 2016-01-20 NOTE — Telephone Encounter (Signed)
Pt haws been advised of the mild fluid overload and to take extra lasix 80 mg this morning and 2 extra potassium tablets today, which will equal to 40 mg total for the day. Pt has been made aware to recheck her weight in the morning and to report to her home health rn and to call us if there was no change. Pt declined questions and verbalized understanding  Medication change made on med list

## 2016-01-20 NOTE — Telephone Encounter (Signed)
-----   Message from Consuelo Pandy, [place] sent at 01/20/2016  8:22 AM EDT ----- Labs suggest possible mild volume overload, which may explain her weight gain. Recommend patient take an extra lasix tablet this morning (160 mg). Continue 80 mg tonight. Take 2 extra potassium tablets today (4 total to equal 40 mEq). Recheck weight tomorrow morning and report back to home health RN. Call our office if no improvement in weight back to baseline.

## 2016-01-21 DIAGNOSIS — D6832 Hemorrhagic disorder due to extrinsic circulating anticoagulants: Secondary | ICD-10-CM | POA: Diagnosis not present

## 2016-01-21 DIAGNOSIS — I10 Essential (primary) hypertension: Secondary | ICD-10-CM | POA: Diagnosis not present

## 2016-01-21 DIAGNOSIS — E119 Type 2 diabetes mellitus without complications: Secondary | ICD-10-CM | POA: Diagnosis not present

## 2016-01-21 DIAGNOSIS — Z9181 History of falling: Secondary | ICD-10-CM | POA: Diagnosis not present

## 2016-01-21 DIAGNOSIS — Z794 Long term (current) use of insulin: Secondary | ICD-10-CM | POA: Diagnosis not present

## 2016-01-21 DIAGNOSIS — Z7901 Long term (current) use of anticoagulants: Secondary | ICD-10-CM | POA: Diagnosis not present

## 2016-01-21 DIAGNOSIS — R2689 Other abnormalities of gait and mobility: Secondary | ICD-10-CM | POA: Diagnosis not present

## 2016-01-25 ENCOUNTER — Encounter: Payer: Self-pay | Admitting: Cardiology

## 2016-01-25 DIAGNOSIS — I10 Essential (primary) hypertension: Secondary | ICD-10-CM | POA: Diagnosis not present

## 2016-01-25 DIAGNOSIS — D6832 Hemorrhagic disorder due to extrinsic circulating anticoagulants: Secondary | ICD-10-CM | POA: Diagnosis not present

## 2016-01-25 DIAGNOSIS — E119 Type 2 diabetes mellitus without complications: Secondary | ICD-10-CM | POA: Diagnosis not present

## 2016-01-25 DIAGNOSIS — R2689 Other abnormalities of gait and mobility: Secondary | ICD-10-CM | POA: Diagnosis not present

## 2016-01-25 DIAGNOSIS — Z9181 History of falling: Secondary | ICD-10-CM | POA: Diagnosis not present

## 2016-01-25 DIAGNOSIS — Z7901 Long term (current) use of anticoagulants: Secondary | ICD-10-CM | POA: Diagnosis not present

## 2016-01-25 DIAGNOSIS — Z794 Long term (current) use of insulin: Secondary | ICD-10-CM | POA: Diagnosis not present

## 2016-01-28 DIAGNOSIS — D6832 Hemorrhagic disorder due to extrinsic circulating anticoagulants: Secondary | ICD-10-CM | POA: Diagnosis not present

## 2016-01-28 DIAGNOSIS — E119 Type 2 diabetes mellitus without complications: Secondary | ICD-10-CM | POA: Diagnosis not present

## 2016-01-28 DIAGNOSIS — Z7901 Long term (current) use of anticoagulants: Secondary | ICD-10-CM | POA: Diagnosis not present

## 2016-01-28 DIAGNOSIS — Z9181 History of falling: Secondary | ICD-10-CM | POA: Diagnosis not present

## 2016-01-28 DIAGNOSIS — Z794 Long term (current) use of insulin: Secondary | ICD-10-CM | POA: Diagnosis not present

## 2016-01-28 DIAGNOSIS — R2689 Other abnormalities of gait and mobility: Secondary | ICD-10-CM | POA: Diagnosis not present

## 2016-01-28 DIAGNOSIS — I10 Essential (primary) hypertension: Secondary | ICD-10-CM | POA: Diagnosis not present

## 2016-02-04 DIAGNOSIS — I10 Essential (primary) hypertension: Secondary | ICD-10-CM | POA: Diagnosis not present

## 2016-02-04 DIAGNOSIS — Z7901 Long term (current) use of anticoagulants: Secondary | ICD-10-CM | POA: Diagnosis not present

## 2016-02-04 DIAGNOSIS — E119 Type 2 diabetes mellitus without complications: Secondary | ICD-10-CM | POA: Diagnosis not present

## 2016-02-04 DIAGNOSIS — D6832 Hemorrhagic disorder due to extrinsic circulating anticoagulants: Secondary | ICD-10-CM | POA: Diagnosis not present

## 2016-02-04 DIAGNOSIS — Z794 Long term (current) use of insulin: Secondary | ICD-10-CM | POA: Diagnosis not present

## 2016-02-04 DIAGNOSIS — Z9181 History of falling: Secondary | ICD-10-CM | POA: Diagnosis not present

## 2016-02-04 DIAGNOSIS — R2689 Other abnormalities of gait and mobility: Secondary | ICD-10-CM | POA: Diagnosis not present

## 2016-02-05 DIAGNOSIS — Z794 Long term (current) use of insulin: Secondary | ICD-10-CM | POA: Diagnosis not present

## 2016-02-05 DIAGNOSIS — Z9181 History of falling: Secondary | ICD-10-CM | POA: Diagnosis not present

## 2016-02-05 DIAGNOSIS — D6832 Hemorrhagic disorder due to extrinsic circulating anticoagulants: Secondary | ICD-10-CM | POA: Diagnosis not present

## 2016-02-05 DIAGNOSIS — Z7901 Long term (current) use of anticoagulants: Secondary | ICD-10-CM | POA: Diagnosis not present

## 2016-02-05 DIAGNOSIS — R2689 Other abnormalities of gait and mobility: Secondary | ICD-10-CM | POA: Diagnosis not present

## 2016-02-05 DIAGNOSIS — I10 Essential (primary) hypertension: Secondary | ICD-10-CM | POA: Diagnosis not present

## 2016-02-05 DIAGNOSIS — E119 Type 2 diabetes mellitus without complications: Secondary | ICD-10-CM | POA: Diagnosis not present

## 2016-02-09 DIAGNOSIS — D6832 Hemorrhagic disorder due to extrinsic circulating anticoagulants: Secondary | ICD-10-CM | POA: Diagnosis not present

## 2016-02-09 DIAGNOSIS — E119 Type 2 diabetes mellitus without complications: Secondary | ICD-10-CM | POA: Diagnosis not present

## 2016-02-09 DIAGNOSIS — I10 Essential (primary) hypertension: Secondary | ICD-10-CM | POA: Diagnosis not present

## 2016-02-09 DIAGNOSIS — R2689 Other abnormalities of gait and mobility: Secondary | ICD-10-CM | POA: Diagnosis not present

## 2016-02-09 DIAGNOSIS — Z9181 History of falling: Secondary | ICD-10-CM | POA: Diagnosis not present

## 2016-02-09 DIAGNOSIS — Z794 Long term (current) use of insulin: Secondary | ICD-10-CM | POA: Diagnosis not present

## 2016-02-09 DIAGNOSIS — Z7901 Long term (current) use of anticoagulants: Secondary | ICD-10-CM | POA: Diagnosis not present

## 2016-02-11 ENCOUNTER — Other Ambulatory Visit: Payer: Self-pay | Admitting: Interventional Cardiology

## 2016-02-11 DIAGNOSIS — R2689 Other abnormalities of gait and mobility: Secondary | ICD-10-CM | POA: Diagnosis not present

## 2016-02-11 DIAGNOSIS — Z9181 History of falling: Secondary | ICD-10-CM | POA: Diagnosis not present

## 2016-02-11 DIAGNOSIS — Z794 Long term (current) use of insulin: Secondary | ICD-10-CM | POA: Diagnosis not present

## 2016-02-11 DIAGNOSIS — E119 Type 2 diabetes mellitus without complications: Secondary | ICD-10-CM | POA: Diagnosis not present

## 2016-02-11 DIAGNOSIS — D6832 Hemorrhagic disorder due to extrinsic circulating anticoagulants: Secondary | ICD-10-CM | POA: Diagnosis not present

## 2016-02-11 DIAGNOSIS — I10 Essential (primary) hypertension: Secondary | ICD-10-CM | POA: Diagnosis not present

## 2016-02-11 DIAGNOSIS — Z7901 Long term (current) use of anticoagulants: Secondary | ICD-10-CM | POA: Diagnosis not present

## 2016-02-18 DIAGNOSIS — E119 Type 2 diabetes mellitus without complications: Secondary | ICD-10-CM | POA: Diagnosis not present

## 2016-02-18 DIAGNOSIS — Z794 Long term (current) use of insulin: Secondary | ICD-10-CM | POA: Diagnosis not present

## 2016-02-18 DIAGNOSIS — I10 Essential (primary) hypertension: Secondary | ICD-10-CM | POA: Diagnosis not present

## 2016-02-18 DIAGNOSIS — D6832 Hemorrhagic disorder due to extrinsic circulating anticoagulants: Secondary | ICD-10-CM | POA: Diagnosis not present

## 2016-02-18 DIAGNOSIS — Z7901 Long term (current) use of anticoagulants: Secondary | ICD-10-CM | POA: Diagnosis not present

## 2016-02-18 DIAGNOSIS — Z9181 History of falling: Secondary | ICD-10-CM | POA: Diagnosis not present

## 2016-02-18 DIAGNOSIS — R2689 Other abnormalities of gait and mobility: Secondary | ICD-10-CM | POA: Diagnosis not present

## 2016-02-19 DIAGNOSIS — R2689 Other abnormalities of gait and mobility: Secondary | ICD-10-CM | POA: Diagnosis not present

## 2016-02-19 DIAGNOSIS — Z794 Long term (current) use of insulin: Secondary | ICD-10-CM | POA: Diagnosis not present

## 2016-02-19 DIAGNOSIS — E119 Type 2 diabetes mellitus without complications: Secondary | ICD-10-CM | POA: Diagnosis not present

## 2016-02-19 DIAGNOSIS — D6832 Hemorrhagic disorder due to extrinsic circulating anticoagulants: Secondary | ICD-10-CM | POA: Diagnosis not present

## 2016-02-19 DIAGNOSIS — I10 Essential (primary) hypertension: Secondary | ICD-10-CM | POA: Diagnosis not present

## 2016-02-19 DIAGNOSIS — Z9181 History of falling: Secondary | ICD-10-CM | POA: Diagnosis not present

## 2016-02-19 DIAGNOSIS — Z7901 Long term (current) use of anticoagulants: Secondary | ICD-10-CM | POA: Diagnosis not present

## 2016-02-22 DIAGNOSIS — Z7901 Long term (current) use of anticoagulants: Secondary | ICD-10-CM | POA: Diagnosis not present

## 2016-02-22 DIAGNOSIS — Z794 Long term (current) use of insulin: Secondary | ICD-10-CM | POA: Diagnosis not present

## 2016-02-22 DIAGNOSIS — I10 Essential (primary) hypertension: Secondary | ICD-10-CM | POA: Diagnosis not present

## 2016-02-22 DIAGNOSIS — D6832 Hemorrhagic disorder due to extrinsic circulating anticoagulants: Secondary | ICD-10-CM | POA: Diagnosis not present

## 2016-02-22 DIAGNOSIS — R2689 Other abnormalities of gait and mobility: Secondary | ICD-10-CM | POA: Diagnosis not present

## 2016-02-22 DIAGNOSIS — Z9181 History of falling: Secondary | ICD-10-CM | POA: Diagnosis not present

## 2016-02-22 DIAGNOSIS — E119 Type 2 diabetes mellitus without complications: Secondary | ICD-10-CM | POA: Diagnosis not present

## 2016-02-24 DIAGNOSIS — E119 Type 2 diabetes mellitus without complications: Secondary | ICD-10-CM | POA: Diagnosis not present

## 2016-02-24 DIAGNOSIS — D6832 Hemorrhagic disorder due to extrinsic circulating anticoagulants: Secondary | ICD-10-CM | POA: Diagnosis not present

## 2016-02-24 DIAGNOSIS — I10 Essential (primary) hypertension: Secondary | ICD-10-CM | POA: Diagnosis not present

## 2016-02-24 DIAGNOSIS — Z794 Long term (current) use of insulin: Secondary | ICD-10-CM | POA: Diagnosis not present

## 2016-02-24 DIAGNOSIS — Z9181 History of falling: Secondary | ICD-10-CM | POA: Diagnosis not present

## 2016-02-24 DIAGNOSIS — Z7901 Long term (current) use of anticoagulants: Secondary | ICD-10-CM | POA: Diagnosis not present

## 2016-02-24 DIAGNOSIS — R2689 Other abnormalities of gait and mobility: Secondary | ICD-10-CM | POA: Diagnosis not present

## 2016-02-29 DIAGNOSIS — R2689 Other abnormalities of gait and mobility: Secondary | ICD-10-CM | POA: Diagnosis not present

## 2016-02-29 DIAGNOSIS — I10 Essential (primary) hypertension: Secondary | ICD-10-CM | POA: Diagnosis not present

## 2016-02-29 DIAGNOSIS — Z7901 Long term (current) use of anticoagulants: Secondary | ICD-10-CM | POA: Diagnosis not present

## 2016-02-29 DIAGNOSIS — Z794 Long term (current) use of insulin: Secondary | ICD-10-CM | POA: Diagnosis not present

## 2016-02-29 DIAGNOSIS — E119 Type 2 diabetes mellitus without complications: Secondary | ICD-10-CM | POA: Diagnosis not present

## 2016-02-29 DIAGNOSIS — Z9181 History of falling: Secondary | ICD-10-CM | POA: Diagnosis not present

## 2016-02-29 DIAGNOSIS — D6832 Hemorrhagic disorder due to extrinsic circulating anticoagulants: Secondary | ICD-10-CM | POA: Diagnosis not present

## 2016-03-02 DIAGNOSIS — Z7901 Long term (current) use of anticoagulants: Secondary | ICD-10-CM | POA: Diagnosis not present

## 2016-03-02 DIAGNOSIS — D6832 Hemorrhagic disorder due to extrinsic circulating anticoagulants: Secondary | ICD-10-CM | POA: Diagnosis not present

## 2016-03-02 DIAGNOSIS — R2689 Other abnormalities of gait and mobility: Secondary | ICD-10-CM | POA: Diagnosis not present

## 2016-03-02 DIAGNOSIS — Z794 Long term (current) use of insulin: Secondary | ICD-10-CM | POA: Diagnosis not present

## 2016-03-02 DIAGNOSIS — Z9181 History of falling: Secondary | ICD-10-CM | POA: Diagnosis not present

## 2016-03-02 DIAGNOSIS — E119 Type 2 diabetes mellitus without complications: Secondary | ICD-10-CM | POA: Diagnosis not present

## 2016-03-02 DIAGNOSIS — I10 Essential (primary) hypertension: Secondary | ICD-10-CM | POA: Diagnosis not present

## 2016-03-07 DIAGNOSIS — Z794 Long term (current) use of insulin: Secondary | ICD-10-CM | POA: Diagnosis not present

## 2016-03-07 DIAGNOSIS — D6832 Hemorrhagic disorder due to extrinsic circulating anticoagulants: Secondary | ICD-10-CM | POA: Diagnosis not present

## 2016-03-07 DIAGNOSIS — R2689 Other abnormalities of gait and mobility: Secondary | ICD-10-CM | POA: Diagnosis not present

## 2016-03-07 DIAGNOSIS — Z9181 History of falling: Secondary | ICD-10-CM | POA: Diagnosis not present

## 2016-03-07 DIAGNOSIS — I10 Essential (primary) hypertension: Secondary | ICD-10-CM | POA: Diagnosis not present

## 2016-03-07 DIAGNOSIS — E119 Type 2 diabetes mellitus without complications: Secondary | ICD-10-CM | POA: Diagnosis not present

## 2016-03-07 DIAGNOSIS — Z7901 Long term (current) use of anticoagulants: Secondary | ICD-10-CM | POA: Diagnosis not present

## 2016-03-09 DIAGNOSIS — Z794 Long term (current) use of insulin: Secondary | ICD-10-CM | POA: Diagnosis not present

## 2016-03-09 DIAGNOSIS — E119 Type 2 diabetes mellitus without complications: Secondary | ICD-10-CM | POA: Diagnosis not present

## 2016-03-09 DIAGNOSIS — I10 Essential (primary) hypertension: Secondary | ICD-10-CM | POA: Diagnosis not present

## 2016-03-09 DIAGNOSIS — R2689 Other abnormalities of gait and mobility: Secondary | ICD-10-CM | POA: Diagnosis not present

## 2016-03-09 DIAGNOSIS — Z7901 Long term (current) use of anticoagulants: Secondary | ICD-10-CM | POA: Diagnosis not present

## 2016-03-09 DIAGNOSIS — Z9181 History of falling: Secondary | ICD-10-CM | POA: Diagnosis not present

## 2016-03-09 DIAGNOSIS — D6832 Hemorrhagic disorder due to extrinsic circulating anticoagulants: Secondary | ICD-10-CM | POA: Diagnosis not present

## 2016-03-11 ENCOUNTER — Telehealth: Payer: Self-pay | Admitting: Interventional Cardiology

## 2016-03-11 NOTE — Telephone Encounter (Signed)
**Note De-Identified  Obfuscation** I received a Fax from Havery Moros with [facility] with same information as phone message. I called [place] and her VM stated she would not be back in the office until Monday 5/22. I called Jennie at [facility] per phone message and got a message stating that she is unavailable but will return my call if I leave a detailed message which I did.

## 2016-03-11 NOTE — Telephone Encounter (Signed)
I have called both phone numbers listed in the pts chart and was unable to reach the pt. Home phone message says voice mailbox is full and her cell phone message states that VM has not been set up.  I called [facility] to speak with Patrici Ranks but was on hold 15 mins+. Will call pt back later today.

## 2016-03-11 NOTE — Telephone Encounter (Signed)
New message     Calling to report findings from last visit with pt: Pt wt is up 3.3 lbs over last 3 days; pt has swelling in both legs from knees down; pt fell last sat but no injuries except for bruises.  Pt states she is drinking more fluids and eating out more; no sob and has not missed any medication.  OK to call pt instead of nurse

## 2016-03-11 NOTE — Telephone Encounter (Signed)
Follow Up:    The phone number she have for the pt  Is [phone]. If you have any other questions please call her.

## 2016-03-11 NOTE — Telephone Encounter (Addendum)
**Note De-Identified  Obfuscation** The pt states that she has had swelling in her feet and ankles for a couple of months and states that even though she has had a 3.3 lb weight gain in 3 days her edema is no worse than it has been. She denies SOB. She is taking Furosemide 80 mg BID daily. She is scheduled to see Dr Irish Lack on 6/16.  Also, she states that she fell this past Saturday and that she is ok other than bruising.   I advised the pt to avoid Fast foods and salt in her diet. She is advised that I am sending this message to Dr Irish Lack and that I will call her back with his recommendations if an. She verbalized understanding.

## 2016-03-14 DIAGNOSIS — Z794 Long term (current) use of insulin: Secondary | ICD-10-CM | POA: Diagnosis not present

## 2016-03-14 DIAGNOSIS — Z7901 Long term (current) use of anticoagulants: Secondary | ICD-10-CM | POA: Diagnosis not present

## 2016-03-14 DIAGNOSIS — E119 Type 2 diabetes mellitus without complications: Secondary | ICD-10-CM | POA: Diagnosis not present

## 2016-03-14 DIAGNOSIS — R2689 Other abnormalities of gait and mobility: Secondary | ICD-10-CM | POA: Diagnosis not present

## 2016-03-14 DIAGNOSIS — I10 Essential (primary) hypertension: Secondary | ICD-10-CM | POA: Diagnosis not present

## 2016-03-14 DIAGNOSIS — D6832 Hemorrhagic disorder due to extrinsic circulating anticoagulants: Secondary | ICD-10-CM | POA: Diagnosis not present

## 2016-03-14 DIAGNOSIS — Z9181 History of falling: Secondary | ICD-10-CM | POA: Diagnosis not present

## 2016-03-18 DIAGNOSIS — D6832 Hemorrhagic disorder due to extrinsic circulating anticoagulants: Secondary | ICD-10-CM | POA: Diagnosis not present

## 2016-03-18 DIAGNOSIS — Z7901 Long term (current) use of anticoagulants: Secondary | ICD-10-CM | POA: Diagnosis not present

## 2016-03-18 DIAGNOSIS — R2689 Other abnormalities of gait and mobility: Secondary | ICD-10-CM | POA: Diagnosis not present

## 2016-03-18 DIAGNOSIS — I10 Essential (primary) hypertension: Secondary | ICD-10-CM | POA: Diagnosis not present

## 2016-03-18 DIAGNOSIS — E119 Type 2 diabetes mellitus without complications: Secondary | ICD-10-CM | POA: Diagnosis not present

## 2016-03-18 DIAGNOSIS — Z794 Long term (current) use of insulin: Secondary | ICD-10-CM | POA: Diagnosis not present

## 2016-03-18 DIAGNOSIS — Z9181 History of falling: Secondary | ICD-10-CM | POA: Diagnosis not present

## 2016-03-20 DIAGNOSIS — Z9181 History of falling: Secondary | ICD-10-CM | POA: Diagnosis not present

## 2016-03-22 DIAGNOSIS — E119 Type 2 diabetes mellitus without complications: Secondary | ICD-10-CM | POA: Diagnosis not present

## 2016-03-22 DIAGNOSIS — Z794 Long term (current) use of insulin: Secondary | ICD-10-CM | POA: Diagnosis not present

## 2016-03-22 DIAGNOSIS — D6832 Hemorrhagic disorder due to extrinsic circulating anticoagulants: Secondary | ICD-10-CM | POA: Diagnosis not present

## 2016-03-22 DIAGNOSIS — M170 Bilateral primary osteoarthritis of knee: Secondary | ICD-10-CM | POA: Diagnosis not present

## 2016-03-22 DIAGNOSIS — E7800 Pure hypercholesterolemia, unspecified: Secondary | ICD-10-CM | POA: Diagnosis not present

## 2016-03-22 DIAGNOSIS — I10 Essential (primary) hypertension: Secondary | ICD-10-CM | POA: Diagnosis not present

## 2016-03-22 DIAGNOSIS — R2689 Other abnormalities of gait and mobility: Secondary | ICD-10-CM | POA: Diagnosis not present

## 2016-03-22 DIAGNOSIS — Z9181 History of falling: Secondary | ICD-10-CM | POA: Diagnosis not present

## 2016-03-22 DIAGNOSIS — E1165 Type 2 diabetes mellitus with hyperglycemia: Secondary | ICD-10-CM | POA: Diagnosis not present

## 2016-03-22 DIAGNOSIS — Z7901 Long term (current) use of anticoagulants: Secondary | ICD-10-CM | POA: Diagnosis not present

## 2016-03-23 DIAGNOSIS — I10 Essential (primary) hypertension: Secondary | ICD-10-CM | POA: Diagnosis not present

## 2016-03-23 DIAGNOSIS — Z9181 History of falling: Secondary | ICD-10-CM | POA: Diagnosis not present

## 2016-03-23 DIAGNOSIS — D6832 Hemorrhagic disorder due to extrinsic circulating anticoagulants: Secondary | ICD-10-CM | POA: Diagnosis not present

## 2016-03-23 DIAGNOSIS — Z7901 Long term (current) use of anticoagulants: Secondary | ICD-10-CM | POA: Diagnosis not present

## 2016-03-23 DIAGNOSIS — Z794 Long term (current) use of insulin: Secondary | ICD-10-CM | POA: Diagnosis not present

## 2016-03-23 DIAGNOSIS — R2689 Other abnormalities of gait and mobility: Secondary | ICD-10-CM | POA: Diagnosis not present

## 2016-03-23 DIAGNOSIS — E119 Type 2 diabetes mellitus without complications: Secondary | ICD-10-CM | POA: Diagnosis not present

## 2016-03-28 DIAGNOSIS — Z7901 Long term (current) use of anticoagulants: Secondary | ICD-10-CM | POA: Diagnosis not present

## 2016-03-28 DIAGNOSIS — I10 Essential (primary) hypertension: Secondary | ICD-10-CM | POA: Diagnosis not present

## 2016-03-28 DIAGNOSIS — D6832 Hemorrhagic disorder due to extrinsic circulating anticoagulants: Secondary | ICD-10-CM | POA: Diagnosis not present

## 2016-03-28 DIAGNOSIS — Z9181 History of falling: Secondary | ICD-10-CM | POA: Diagnosis not present

## 2016-03-28 DIAGNOSIS — R2689 Other abnormalities of gait and mobility: Secondary | ICD-10-CM | POA: Diagnosis not present

## 2016-03-28 DIAGNOSIS — Z794 Long term (current) use of insulin: Secondary | ICD-10-CM | POA: Diagnosis not present

## 2016-03-28 DIAGNOSIS — E119 Type 2 diabetes mellitus without complications: Secondary | ICD-10-CM | POA: Diagnosis not present

## 2016-04-04 DIAGNOSIS — H9190 Unspecified hearing loss, unspecified ear: Secondary | ICD-10-CM | POA: Diagnosis not present

## 2016-04-04 DIAGNOSIS — N08 Glomerular disorders in diseases classified elsewhere: Secondary | ICD-10-CM | POA: Diagnosis not present

## 2016-04-04 DIAGNOSIS — E1122 Type 2 diabetes mellitus with diabetic chronic kidney disease: Secondary | ICD-10-CM | POA: Diagnosis not present

## 2016-04-04 DIAGNOSIS — I5030 Unspecified diastolic (congestive) heart failure: Secondary | ICD-10-CM | POA: Diagnosis not present

## 2016-04-04 DIAGNOSIS — Z7901 Long term (current) use of anticoagulants: Secondary | ICD-10-CM | POA: Diagnosis not present

## 2016-04-04 DIAGNOSIS — I130 Hypertensive heart and chronic kidney disease with heart failure and stage 1 through stage 4 chronic kidney disease, or unspecified chronic kidney disease: Secondary | ICD-10-CM | POA: Diagnosis not present

## 2016-04-04 DIAGNOSIS — E559 Vitamin D deficiency, unspecified: Secondary | ICD-10-CM | POA: Diagnosis not present

## 2016-04-04 DIAGNOSIS — Z23 Encounter for immunization: Secondary | ICD-10-CM | POA: Diagnosis not present

## 2016-04-07 NOTE — Progress Notes (Signed)
Cardiology Office Note   Date:  04/08/2016   ID:  [name], DOB [birth year], MRN [id number]  PCP:  Maximino Greenland, MD    No chief complaint on file. diastolic heart failure   Wt Readings from Last 3 Encounters:  04/08/16 211 lb 12.8 oz (96.072 kg)  01/19/16 213 lb 1.9 oz (96.671 kg)  09/14/15 213 lb (96.616 kg)       History of Present Illness: [name] is a 82 y.o. female  who has had diastolic dysfunction in the past. She is on Coumadin for DVT. She has had atrial arrhythmias and chronic LE edema.   In addition to the history of h/o DVT (on Coumadin for this), chronic diastolic CHF, she also has deconditioning, obesity, HTN, SVT (correlated with pre-syncope, on amiodarone), DM, chronic anemia/thrombocytopenia, LBBB, probable CKD stage III (Cr 1.3 in 08/2014), mod MR by echo 2014 who presents for f/u. Per notes in 2013 she had an EPS with ablation for SVT which did not eliminate the SVT completely. She also had bradycardia which limited medication. She was placed on amiodarone by Dr. Lovena Le. Last echo 07/2013: mild LVH, moderate focal basal hypertrophy of septum, EF 55-60%, grade 1 DD, mod MR, mod LAE, PASP 40.  She sustained a mechanical fall in 07/2015 and was in the hospital for a week then rehab until [place]. She has since been discharged and is doing PT/OT at home. She has noticed increase in her chronic edema. She and her daughter said she gained weight at SNF but her weight is actually down about 10 lbs here compared to last year. Reports compliance with Lasix, low sodium diet, and does not drink excess fluid. UOP as expected. No CP, SOB, presyncope or syncope. INR is followed by [facility].  She was seen in [facility] 2017 for fluid overload.  Lasix was increased and potassium was increased.  SHe was concerned that he was taking too much diuretic.  She is bothered by arthritis.  SHe has had some stomach pain.  She fell again- no broken bones, only  bruising.  BP  Has been well controlled with nurse visits at her home.  Followed by [name].     Past Medical History  Diagnosis Date  . Thrombocytopenia ([place]) 11/21/2011  . DVT (deep venous thrombosis) ([place])     a. On Coumadin for this.  . Dizziness and giddiness   . Chronic diastolic CHF (congestive heart failure) ([place])   . Muscular deconditioning   . Obesity   . Essential hypertension   . Diabetes mellitus ([place])   . SVT (supraventricular tachycardia) ([place])     a. In 2013 she had an EPS with ablation for SVT which did not eliminate the SVT completely. She also had bradycardia which limited medication. She was placed on amiodarone by Dr. Lovena Le.  . Anemia   . LBBB (left bundle branch block)   . CKD (chronic kidney disease), stage III   . Mitral regurgitation     a. Mod MR by echo 2014.    Past Surgical History  Procedure Laterality Date  . Supraventricular tachycardia ablation N/A 10/11/2012    Procedure: SUPRAVENTRICULAR TACHYCARDIA ABLATION;  Surgeon: Evans Lance, MD;  Location: [facility] CATH LAB;  Service: Cardiovascular;  Laterality: N/A;     Current Outpatient Prescriptions  Medication Sig Dispense Refill  . ACCU-CHEK AVIVA PLUS test strip USE AS DIRECTED TO CHECK BLOOD SUGAR QID  11  . amiodarone (PACERONE) 200 MG tablet  TAKE ONE TABLET BY MOUTH DAILY 90 tablet 3  . amLODipine (NORVASC) 5 MG tablet Take 1 tablet (5 mg total) by mouth daily. 30 tablet 3  . atenolol (TENORMIN) 50 MG tablet Take 25 mg by mouth daily.    [name] atorvastatin (LIPITOR) 20 MG tablet Take 20 mg by mouth daily.    . BD PEN NEEDLE NANO U/F 32G X 4 MM MISC U UTD QID FOR 30 DAYS  0  . calcium carbonate (OS-CAL) 600 MG TABS Take 600 mg by mouth 2 (two) times daily with a meal.    . Colesevelam HCl 3.75 G PACK Take 3.75 g by mouth daily.    . ferrous gluconate (FERGON) 324 MG tablet Take 325 mg by mouth daily with breakfast.     . furosemide (LASIX) 80 MG tablet Take 2 tablets by mouth in the morning  and 1 tablet by mouth in the evening X's 1 then go back to 1 tablet by mouth in the morning and 1 tablet by mouth in the evening 60 tablet 9  . gabapentin (NEURONTIN) 100 MG capsule     . HUMALOG KWIKPEN 100 UNIT/ML KiwkPen Inject into the skin 3 (three) times daily. AS DIRECTED  3  . LEVEMIR FLEXTOUCH 100 UNIT/ML Pen INJECT 30 UNITS UNDER THE SKIN QHS UTD PER SLIDING SCALE PROTOCOL  4  . losartan (COZAAR) 100 MG tablet TAKE 1 TABLET BY MOUTH DAILY 90 tablet 2  . magnesium oxide (MAG-OX) 400 MG tablet Take 400 mg by mouth daily.    [name] omeprazole (PRILOSEC) 40 MG capsule Take 40 mg by mouth daily.    [name] oxybutynin (DITROPAN) 5 MG tablet Take 5 mg by mouth 2 (two) times daily.    . potassium chloride (MICRO-K) 10 MEQ CR capsule Take 4 tablets by mouth X's 1 day then go back to 2 tablets by mouth daily 180 capsule 1  . traMADol (ULTRAM) 50 MG tablet Take 50 mg by mouth every 6 (six) hours as needed for pain.    . Vitamin D, Ergocalciferol, (DRISDOL) 50000 UNITS CAPS Take 5,000 Units by mouth 2 (two) times a week.     . warfarin (COUMADIN) 10 MG tablet Take 10 mg by mouth daily. Takes 7.5 mg along with a 10 mg tablet DAILY,, TEUSDAY and THURSDAY takes 3.75 mg     No current facility-administered medications for this visit.    Allergies:   Aspirin and Penicillins    Social History:  The patient  reports that she has never smoked. She has never used smokeless tobacco. She reports that she does not drink alcohol or use illicit drugs.   Family History:  The patient's family history includes Heart attack in her brother; Hypertension in her daughter; Stroke in her mother and sister.    ROS:  Please see the history of present illness.   Otherwise, review of systems are positive for persistent swelling.   All other systems are reviewed and negative.    PHYSICAL EXAM: VS:  BP 150/80 mmHg  Pulse 60  Ht 5\' 5"  (1.651 m)  Wt 211 lb 12.8 oz (96.072 kg)  BMI 35.25 kg/m2 , BMI Body mass index is 35.25  kg/(m^2). GEN: Well nourished, well developed, in no acute distress HEENT: normal Neck: no JVD, carotid bruits, or masses Cardiac: RRR; no murmurs, rubs, or gallops,bilateral pitting edema  Respiratory:  clear to auscultation bilaterally, normal work of breathing GI: soft, nontender, nondistended, + BS MS: no deformity or atrophy Skin: warm  and dry, no rash Neuro:  Strength and sensation are intact Psych: euthymic mood, full affect      Recent Labs: 09/14/2015: ALT 17; Hemoglobin 10.5*; Platelets 156; TSH 1.000 01/19/2016: Brain Natriuretic Peptide 178.7*; BUN 28*; Creat 1.50*; Potassium 4.1; Sodium 141   Lipid Panel    Component Value Date/Time   CHOL * 12/11/2008 0130    202        ATP III CLASSIFICATION:  <200     mg/dL   Desirable  200-239  mg/dL   Borderline High  >=240    mg/dL   High          TRIG 64 12/11/2008 0130   HDL 78 12/11/2008 0130   CHOLHDL 2.6 12/11/2008 0130   VLDL 13 12/11/2008 0130   LDLCALC * 12/11/2008 0130    111        Total Cholesterol/HDL:CHD Risk Coronary Heart Disease Risk Table                     Men   Women  1/2 Average Risk   3.4   3.3  Average Risk       5.0   4.4  2 X Average Risk   9.6   7.1  3 X Average Risk  23.4   11.0        Use the calculated Patient Ratio above and the CHD Risk Table to determine the patient's CHD Risk.        ATP III CLASSIFICATION (LDL):  <100     mg/dL   Optimal  100-129  mg/dL   Near or Above                    Optimal  130-159  mg/dL   Borderline  160-189  mg/dL   High  >190     mg/dL   Very High     Other studies Reviewed: Additional studies/ records that were reviewed today with results demonstrating: awaiting lab results.   ASSESSMENT AND PLAN:  1. Chronic diastolic heart failure:  Still on higher dose of Lasix.  Check kidney function results from a few days ago.  Adjust diuretic based on renal function. Persistent edema.  COntinue to elevate legs as well.  Compression stocking also help.   Also with some venous insufficiency.   2. SVT:  No fast heart beats.  Controlled.  3. HTN Controlled at nurse visits. 4. Hyperlipidemia: Followed by [name].  Continue atorvastatin.  5. DM: Insulin recently increased by Dr. Chalmers Cater.    Current medicines are reviewed at length with the patient today.  The patient concerns regarding her medicines were addressed.  The following changes have been made:  No change  Labs/ tests ordered today include: get recent results No orders of the defined types were placed in this encounter.    Recommend 150 minutes/week of aerobic exercise Low fat, low carb, high fiber diet recommended  Disposition:   FU in 6 months   Signed, Larae Grooms, MD  04/08/2016 10:16 AM    [facility] [place], [place], [place]  [address] Phone: [phone]; Fax: [phone]

## 2016-04-08 ENCOUNTER — Encounter: Payer: Self-pay | Admitting: Interventional Cardiology

## 2016-04-08 ENCOUNTER — Ambulatory Visit (INDEPENDENT_AMBULATORY_CARE_PROVIDER_SITE_OTHER): Payer: Medicare Other | Admitting: Interventional Cardiology

## 2016-04-08 VITALS — BP 150/80 | HR 60 | Ht 65.0 in | Wt 211.8 lb

## 2016-04-08 DIAGNOSIS — E785 Hyperlipidemia, unspecified: Secondary | ICD-10-CM | POA: Diagnosis not present

## 2016-04-08 DIAGNOSIS — I5032 Chronic diastolic (congestive) heart failure: Secondary | ICD-10-CM

## 2016-04-08 DIAGNOSIS — R600 Localized edema: Secondary | ICD-10-CM

## 2016-04-08 DIAGNOSIS — E119 Type 2 diabetes mellitus without complications: Secondary | ICD-10-CM | POA: Diagnosis not present

## 2016-04-08 DIAGNOSIS — I10 Essential (primary) hypertension: Secondary | ICD-10-CM | POA: Diagnosis not present

## 2016-04-08 DIAGNOSIS — Z794 Long term (current) use of insulin: Secondary | ICD-10-CM

## 2016-04-08 NOTE — Telephone Encounter (Signed)
    Cr increased slightly.  Drink plenty of water.  Potassium stable.  Labs to be rechecked when she see the kidney doctor.    Jettie Booze, MD

## 2016-04-08 NOTE — Patient Instructions (Signed)
**Note De-identified  Obfuscation** Medication Instructions:  Same-no changes  Labwork: None  Testing/Procedures: None  Follow-Up: Your physician wants you to follow-up in: 6 months. You will receive a reminder letter in the mail two months in advance. If you don't receive a letter, please call our office to schedule the follow-up appointment.      If you need a refill on your cardiac medications before your next appointment, please call your pharmacy.   

## 2016-04-08 NOTE — Telephone Encounter (Signed)
**Note De-identified  Obfuscation** The pt is advised and she verbalized understanding. 

## 2016-04-11 DIAGNOSIS — R2689 Other abnormalities of gait and mobility: Secondary | ICD-10-CM | POA: Diagnosis not present

## 2016-04-11 DIAGNOSIS — Z794 Long term (current) use of insulin: Secondary | ICD-10-CM | POA: Diagnosis not present

## 2016-04-11 DIAGNOSIS — D6832 Hemorrhagic disorder due to extrinsic circulating anticoagulants: Secondary | ICD-10-CM | POA: Diagnosis not present

## 2016-04-11 DIAGNOSIS — Z7901 Long term (current) use of anticoagulants: Secondary | ICD-10-CM | POA: Diagnosis not present

## 2016-04-11 DIAGNOSIS — E119 Type 2 diabetes mellitus without complications: Secondary | ICD-10-CM | POA: Diagnosis not present

## 2016-04-11 DIAGNOSIS — Z9181 History of falling: Secondary | ICD-10-CM | POA: Diagnosis not present

## 2016-04-11 DIAGNOSIS — I10 Essential (primary) hypertension: Secondary | ICD-10-CM | POA: Diagnosis not present

## 2016-04-14 ENCOUNTER — Encounter: Payer: Self-pay | Admitting: Interventional Cardiology

## 2016-04-14 ENCOUNTER — Encounter: Payer: Self-pay | Admitting: Internal Medicine

## 2016-04-19 DIAGNOSIS — I10 Essential (primary) hypertension: Secondary | ICD-10-CM | POA: Diagnosis not present

## 2016-04-19 DIAGNOSIS — Z794 Long term (current) use of insulin: Secondary | ICD-10-CM | POA: Diagnosis not present

## 2016-04-19 DIAGNOSIS — Z7901 Long term (current) use of anticoagulants: Secondary | ICD-10-CM | POA: Diagnosis not present

## 2016-04-19 DIAGNOSIS — D6832 Hemorrhagic disorder due to extrinsic circulating anticoagulants: Secondary | ICD-10-CM | POA: Diagnosis not present

## 2016-04-19 DIAGNOSIS — Z9181 History of falling: Secondary | ICD-10-CM | POA: Diagnosis not present

## 2016-04-19 DIAGNOSIS — R2689 Other abnormalities of gait and mobility: Secondary | ICD-10-CM | POA: Diagnosis not present

## 2016-04-19 DIAGNOSIS — E119 Type 2 diabetes mellitus without complications: Secondary | ICD-10-CM | POA: Diagnosis not present

## 2016-04-20 DIAGNOSIS — Z794 Long term (current) use of insulin: Secondary | ICD-10-CM | POA: Diagnosis not present

## 2016-04-20 DIAGNOSIS — D6832 Hemorrhagic disorder due to extrinsic circulating anticoagulants: Secondary | ICD-10-CM | POA: Diagnosis not present

## 2016-04-20 DIAGNOSIS — I10 Essential (primary) hypertension: Secondary | ICD-10-CM | POA: Diagnosis not present

## 2016-04-20 DIAGNOSIS — R2689 Other abnormalities of gait and mobility: Secondary | ICD-10-CM | POA: Diagnosis not present

## 2016-04-20 DIAGNOSIS — Z7901 Long term (current) use of anticoagulants: Secondary | ICD-10-CM | POA: Diagnosis not present

## 2016-04-20 DIAGNOSIS — Z9181 History of falling: Secondary | ICD-10-CM | POA: Diagnosis not present

## 2016-04-20 DIAGNOSIS — E119 Type 2 diabetes mellitus without complications: Secondary | ICD-10-CM | POA: Diagnosis not present

## 2016-04-22 DIAGNOSIS — D6832 Hemorrhagic disorder due to extrinsic circulating anticoagulants: Secondary | ICD-10-CM | POA: Diagnosis not present

## 2016-04-22 DIAGNOSIS — R2689 Other abnormalities of gait and mobility: Secondary | ICD-10-CM | POA: Diagnosis not present

## 2016-04-22 DIAGNOSIS — I10 Essential (primary) hypertension: Secondary | ICD-10-CM | POA: Diagnosis not present

## 2016-04-22 DIAGNOSIS — Z7901 Long term (current) use of anticoagulants: Secondary | ICD-10-CM | POA: Diagnosis not present

## 2016-04-22 DIAGNOSIS — Z794 Long term (current) use of insulin: Secondary | ICD-10-CM | POA: Diagnosis not present

## 2016-04-22 DIAGNOSIS — Z9181 History of falling: Secondary | ICD-10-CM | POA: Diagnosis not present

## 2016-04-22 DIAGNOSIS — E119 Type 2 diabetes mellitus without complications: Secondary | ICD-10-CM | POA: Diagnosis not present

## 2016-04-22 DIAGNOSIS — I5040 Unspecified combined systolic (congestive) and diastolic (congestive) heart failure: Secondary | ICD-10-CM | POA: Diagnosis not present

## 2016-04-27 DIAGNOSIS — Z7901 Long term (current) use of anticoagulants: Secondary | ICD-10-CM | POA: Diagnosis not present

## 2016-04-27 DIAGNOSIS — E119 Type 2 diabetes mellitus without complications: Secondary | ICD-10-CM | POA: Diagnosis not present

## 2016-04-27 DIAGNOSIS — I10 Essential (primary) hypertension: Secondary | ICD-10-CM | POA: Diagnosis not present

## 2016-04-27 DIAGNOSIS — D6832 Hemorrhagic disorder due to extrinsic circulating anticoagulants: Secondary | ICD-10-CM | POA: Diagnosis not present

## 2016-04-27 DIAGNOSIS — Z9181 History of falling: Secondary | ICD-10-CM | POA: Diagnosis not present

## 2016-04-27 DIAGNOSIS — R2689 Other abnormalities of gait and mobility: Secondary | ICD-10-CM | POA: Diagnosis not present

## 2016-04-27 DIAGNOSIS — I5040 Unspecified combined systolic (congestive) and diastolic (congestive) heart failure: Secondary | ICD-10-CM | POA: Diagnosis not present

## 2016-04-27 DIAGNOSIS — Z794 Long term (current) use of insulin: Secondary | ICD-10-CM | POA: Diagnosis not present

## 2016-04-28 DIAGNOSIS — Z7901 Long term (current) use of anticoagulants: Secondary | ICD-10-CM | POA: Diagnosis not present

## 2016-04-28 DIAGNOSIS — I5040 Unspecified combined systolic (congestive) and diastolic (congestive) heart failure: Secondary | ICD-10-CM | POA: Diagnosis not present

## 2016-04-28 DIAGNOSIS — R2689 Other abnormalities of gait and mobility: Secondary | ICD-10-CM | POA: Diagnosis not present

## 2016-04-28 DIAGNOSIS — Z9181 History of falling: Secondary | ICD-10-CM | POA: Diagnosis not present

## 2016-04-28 DIAGNOSIS — Z794 Long term (current) use of insulin: Secondary | ICD-10-CM | POA: Diagnosis not present

## 2016-04-28 DIAGNOSIS — E119 Type 2 diabetes mellitus without complications: Secondary | ICD-10-CM | POA: Diagnosis not present

## 2016-04-28 DIAGNOSIS — I10 Essential (primary) hypertension: Secondary | ICD-10-CM | POA: Diagnosis not present

## 2016-04-28 DIAGNOSIS — D6832 Hemorrhagic disorder due to extrinsic circulating anticoagulants: Secondary | ICD-10-CM | POA: Diagnosis not present

## 2016-05-02 DIAGNOSIS — Z794 Long term (current) use of insulin: Secondary | ICD-10-CM | POA: Diagnosis not present

## 2016-05-02 DIAGNOSIS — I10 Essential (primary) hypertension: Secondary | ICD-10-CM | POA: Diagnosis not present

## 2016-05-02 DIAGNOSIS — I5040 Unspecified combined systolic (congestive) and diastolic (congestive) heart failure: Secondary | ICD-10-CM | POA: Diagnosis not present

## 2016-05-02 DIAGNOSIS — D6832 Hemorrhagic disorder due to extrinsic circulating anticoagulants: Secondary | ICD-10-CM | POA: Diagnosis not present

## 2016-05-02 DIAGNOSIS — Z7901 Long term (current) use of anticoagulants: Secondary | ICD-10-CM | POA: Diagnosis not present

## 2016-05-02 DIAGNOSIS — R2689 Other abnormalities of gait and mobility: Secondary | ICD-10-CM | POA: Diagnosis not present

## 2016-05-02 DIAGNOSIS — Z9181 History of falling: Secondary | ICD-10-CM | POA: Diagnosis not present

## 2016-05-02 DIAGNOSIS — E119 Type 2 diabetes mellitus without complications: Secondary | ICD-10-CM | POA: Diagnosis not present

## 2016-05-05 DIAGNOSIS — H524 Presbyopia: Secondary | ICD-10-CM | POA: Diagnosis not present

## 2016-05-05 DIAGNOSIS — E119 Type 2 diabetes mellitus without complications: Secondary | ICD-10-CM | POA: Diagnosis not present

## 2016-05-05 DIAGNOSIS — H26491 Other secondary cataract, right eye: Secondary | ICD-10-CM | POA: Diagnosis not present

## 2016-05-05 DIAGNOSIS — H2513 Age-related nuclear cataract, bilateral: Secondary | ICD-10-CM | POA: Diagnosis not present

## 2016-05-05 DIAGNOSIS — H35033 Hypertensive retinopathy, bilateral: Secondary | ICD-10-CM | POA: Diagnosis not present

## 2016-05-06 DIAGNOSIS — Z7901 Long term (current) use of anticoagulants: Secondary | ICD-10-CM | POA: Diagnosis not present

## 2016-05-06 DIAGNOSIS — I10 Essential (primary) hypertension: Secondary | ICD-10-CM | POA: Diagnosis not present

## 2016-05-06 DIAGNOSIS — R2689 Other abnormalities of gait and mobility: Secondary | ICD-10-CM | POA: Diagnosis not present

## 2016-05-06 DIAGNOSIS — I5040 Unspecified combined systolic (congestive) and diastolic (congestive) heart failure: Secondary | ICD-10-CM | POA: Diagnosis not present

## 2016-05-06 DIAGNOSIS — Z794 Long term (current) use of insulin: Secondary | ICD-10-CM | POA: Diagnosis not present

## 2016-05-06 DIAGNOSIS — E119 Type 2 diabetes mellitus without complications: Secondary | ICD-10-CM | POA: Diagnosis not present

## 2016-05-06 DIAGNOSIS — D6832 Hemorrhagic disorder due to extrinsic circulating anticoagulants: Secondary | ICD-10-CM | POA: Diagnosis not present

## 2016-05-06 DIAGNOSIS — Z9181 History of falling: Secondary | ICD-10-CM | POA: Diagnosis not present

## 2016-05-10 ENCOUNTER — Telehealth: Payer: Self-pay | Admitting: *Deleted

## 2016-05-10 NOTE — Telephone Encounter (Signed)
**Note De-Identified  Obfuscation** Jeanann Lewandowsky, RMA at 01/20/2016 8:42 AM     Status: Signed       Expand All Collapse All   Pt haws been advised of the mild fluid overload and to take extra lasix 80 mg this morning and 2 extra potassium tablets today, which will equal to 40 mg total for the day. Pt has been made aware to recheck her weight in the morning and to report to her home health rn and to call us if there was no change. Pt declined questions and verbalized understanding  Medication change made on med list       Will forward to Jeanann Lewandowsky, RMA per phone call on 01/20/16.

## 2016-05-10 NOTE — Telephone Encounter (Signed)
Message     Yes 2 a day. I tried to call pt, but no answer at either number.     Per above message from [facility]. I will try to reach patient at another time to discuss.

## 2016-05-10 NOTE — Telephone Encounter (Signed)
Patient called and stated that the pharmacy will not refill the potassium for her as they informed her that it is too soon per insurance. She stated that she has been taking two capsules twice a day. Her chart indicates that she should be taking two capsules a day. She would like to know how she should be taking it. She also seemed a little confused on how she should be taking the furosemide. She can be reached at [phone] or [phone]. Thanks, MI

## 2016-05-10 NOTE — Telephone Encounter (Signed)
Message     Not sure why pt was taking 2 twice a day, when instructions stated 2 extra doses that day only!        What to do in this case [name]?      Should the patient go back to two capsules daily? She will have to pay out of pocket for this rx as it is too soon per insurance.

## 2016-05-10 NOTE — Telephone Encounter (Signed)
I attempted to reach patient to discuss. No answer and vm is full.

## 2016-05-11 DIAGNOSIS — Z794 Long term (current) use of insulin: Secondary | ICD-10-CM | POA: Diagnosis not present

## 2016-05-11 DIAGNOSIS — E119 Type 2 diabetes mellitus without complications: Secondary | ICD-10-CM | POA: Diagnosis not present

## 2016-05-11 DIAGNOSIS — Z9181 History of falling: Secondary | ICD-10-CM | POA: Diagnosis not present

## 2016-05-11 DIAGNOSIS — I10 Essential (primary) hypertension: Secondary | ICD-10-CM | POA: Diagnosis not present

## 2016-05-11 DIAGNOSIS — Z7901 Long term (current) use of anticoagulants: Secondary | ICD-10-CM | POA: Diagnosis not present

## 2016-05-11 DIAGNOSIS — D6832 Hemorrhagic disorder due to extrinsic circulating anticoagulants: Secondary | ICD-10-CM | POA: Diagnosis not present

## 2016-05-11 DIAGNOSIS — I5040 Unspecified combined systolic (congestive) and diastolic (congestive) heart failure: Secondary | ICD-10-CM | POA: Diagnosis not present

## 2016-05-11 DIAGNOSIS — R2689 Other abnormalities of gait and mobility: Secondary | ICD-10-CM | POA: Diagnosis not present

## 2016-05-12 NOTE — Telephone Encounter (Signed)
Patients [employer] nurse case manager called in regards to this rx. I informed her that we have made several attempts to reach the patient but have been unsuccessful. I have made her aware that the patient would have to pay oop for the rx until insurance will pay again. She stated that the patient purchased 12 capsules from the pharmacy. She will explain this to the patient and ask the patient to call our office if she has any further questions.

## 2016-05-16 DIAGNOSIS — Z794 Long term (current) use of insulin: Secondary | ICD-10-CM | POA: Diagnosis not present

## 2016-05-16 DIAGNOSIS — Z9181 History of falling: Secondary | ICD-10-CM | POA: Diagnosis not present

## 2016-05-16 DIAGNOSIS — D6832 Hemorrhagic disorder due to extrinsic circulating anticoagulants: Secondary | ICD-10-CM | POA: Diagnosis not present

## 2016-05-16 DIAGNOSIS — R2689 Other abnormalities of gait and mobility: Secondary | ICD-10-CM | POA: Diagnosis not present

## 2016-05-16 DIAGNOSIS — Z7901 Long term (current) use of anticoagulants: Secondary | ICD-10-CM | POA: Diagnosis not present

## 2016-05-16 DIAGNOSIS — I5040 Unspecified combined systolic (congestive) and diastolic (congestive) heart failure: Secondary | ICD-10-CM | POA: Diagnosis not present

## 2016-05-16 DIAGNOSIS — I10 Essential (primary) hypertension: Secondary | ICD-10-CM | POA: Diagnosis not present

## 2016-05-16 DIAGNOSIS — E119 Type 2 diabetes mellitus without complications: Secondary | ICD-10-CM | POA: Diagnosis not present

## 2016-05-18 DIAGNOSIS — Z794 Long term (current) use of insulin: Secondary | ICD-10-CM | POA: Diagnosis not present

## 2016-05-18 DIAGNOSIS — I10 Essential (primary) hypertension: Secondary | ICD-10-CM | POA: Diagnosis not present

## 2016-05-18 DIAGNOSIS — I5040 Unspecified combined systolic (congestive) and diastolic (congestive) heart failure: Secondary | ICD-10-CM | POA: Diagnosis not present

## 2016-05-18 DIAGNOSIS — Z7901 Long term (current) use of anticoagulants: Secondary | ICD-10-CM | POA: Diagnosis not present

## 2016-05-18 DIAGNOSIS — D6832 Hemorrhagic disorder due to extrinsic circulating anticoagulants: Secondary | ICD-10-CM | POA: Diagnosis not present

## 2016-05-18 DIAGNOSIS — E119 Type 2 diabetes mellitus without complications: Secondary | ICD-10-CM | POA: Diagnosis not present

## 2016-05-18 DIAGNOSIS — R2689 Other abnormalities of gait and mobility: Secondary | ICD-10-CM | POA: Diagnosis not present

## 2016-05-18 DIAGNOSIS — Z9181 History of falling: Secondary | ICD-10-CM | POA: Diagnosis not present

## 2016-05-20 ENCOUNTER — Other Ambulatory Visit: Payer: Self-pay | Admitting: Interventional Cardiology

## 2016-05-20 DIAGNOSIS — Z7901 Long term (current) use of anticoagulants: Secondary | ICD-10-CM | POA: Diagnosis not present

## 2016-05-20 DIAGNOSIS — I10 Essential (primary) hypertension: Secondary | ICD-10-CM | POA: Diagnosis not present

## 2016-05-20 DIAGNOSIS — D6832 Hemorrhagic disorder due to extrinsic circulating anticoagulants: Secondary | ICD-10-CM | POA: Diagnosis not present

## 2016-05-20 DIAGNOSIS — E119 Type 2 diabetes mellitus without complications: Secondary | ICD-10-CM | POA: Diagnosis not present

## 2016-05-20 DIAGNOSIS — Z794 Long term (current) use of insulin: Secondary | ICD-10-CM | POA: Diagnosis not present

## 2016-05-20 DIAGNOSIS — R2689 Other abnormalities of gait and mobility: Secondary | ICD-10-CM | POA: Diagnosis not present

## 2016-05-20 DIAGNOSIS — I5040 Unspecified combined systolic (congestive) and diastolic (congestive) heart failure: Secondary | ICD-10-CM | POA: Diagnosis not present

## 2016-05-20 DIAGNOSIS — Z9181 History of falling: Secondary | ICD-10-CM | POA: Diagnosis not present

## 2016-05-27 DIAGNOSIS — Z9181 History of falling: Secondary | ICD-10-CM | POA: Diagnosis not present

## 2016-05-27 DIAGNOSIS — I5040 Unspecified combined systolic (congestive) and diastolic (congestive) heart failure: Secondary | ICD-10-CM | POA: Diagnosis not present

## 2016-05-27 DIAGNOSIS — Z7901 Long term (current) use of anticoagulants: Secondary | ICD-10-CM | POA: Diagnosis not present

## 2016-05-27 DIAGNOSIS — D6832 Hemorrhagic disorder due to extrinsic circulating anticoagulants: Secondary | ICD-10-CM | POA: Diagnosis not present

## 2016-05-27 DIAGNOSIS — Z794 Long term (current) use of insulin: Secondary | ICD-10-CM | POA: Diagnosis not present

## 2016-05-27 DIAGNOSIS — R2689 Other abnormalities of gait and mobility: Secondary | ICD-10-CM | POA: Diagnosis not present

## 2016-05-27 DIAGNOSIS — E119 Type 2 diabetes mellitus without complications: Secondary | ICD-10-CM | POA: Diagnosis not present

## 2016-05-27 DIAGNOSIS — I10 Essential (primary) hypertension: Secondary | ICD-10-CM | POA: Diagnosis not present

## 2016-05-30 DIAGNOSIS — I5040 Unspecified combined systolic (congestive) and diastolic (congestive) heart failure: Secondary | ICD-10-CM | POA: Diagnosis not present

## 2016-05-30 DIAGNOSIS — D6832 Hemorrhagic disorder due to extrinsic circulating anticoagulants: Secondary | ICD-10-CM | POA: Diagnosis not present

## 2016-05-30 DIAGNOSIS — Z7901 Long term (current) use of anticoagulants: Secondary | ICD-10-CM | POA: Diagnosis not present

## 2016-05-30 DIAGNOSIS — Z9181 History of falling: Secondary | ICD-10-CM | POA: Diagnosis not present

## 2016-05-30 DIAGNOSIS — Z794 Long term (current) use of insulin: Secondary | ICD-10-CM | POA: Diagnosis not present

## 2016-05-30 DIAGNOSIS — I10 Essential (primary) hypertension: Secondary | ICD-10-CM | POA: Diagnosis not present

## 2016-05-30 DIAGNOSIS — R2689 Other abnormalities of gait and mobility: Secondary | ICD-10-CM | POA: Diagnosis not present

## 2016-05-30 DIAGNOSIS — E119 Type 2 diabetes mellitus without complications: Secondary | ICD-10-CM | POA: Diagnosis not present

## 2016-06-03 DIAGNOSIS — E119 Type 2 diabetes mellitus without complications: Secondary | ICD-10-CM | POA: Diagnosis not present

## 2016-06-03 DIAGNOSIS — Z9181 History of falling: Secondary | ICD-10-CM | POA: Diagnosis not present

## 2016-06-03 DIAGNOSIS — I5040 Unspecified combined systolic (congestive) and diastolic (congestive) heart failure: Secondary | ICD-10-CM | POA: Diagnosis not present

## 2016-06-03 DIAGNOSIS — R2689 Other abnormalities of gait and mobility: Secondary | ICD-10-CM | POA: Diagnosis not present

## 2016-06-03 DIAGNOSIS — Z7901 Long term (current) use of anticoagulants: Secondary | ICD-10-CM | POA: Diagnosis not present

## 2016-06-03 DIAGNOSIS — I10 Essential (primary) hypertension: Secondary | ICD-10-CM | POA: Diagnosis not present

## 2016-06-03 DIAGNOSIS — Z794 Long term (current) use of insulin: Secondary | ICD-10-CM | POA: Diagnosis not present

## 2016-06-03 DIAGNOSIS — D6832 Hemorrhagic disorder due to extrinsic circulating anticoagulants: Secondary | ICD-10-CM | POA: Diagnosis not present

## 2016-06-08 DIAGNOSIS — N183 Chronic kidney disease, stage 3 (moderate): Secondary | ICD-10-CM | POA: Diagnosis not present

## 2016-06-08 DIAGNOSIS — Z794 Long term (current) use of insulin: Secondary | ICD-10-CM | POA: Diagnosis not present

## 2016-06-08 DIAGNOSIS — Z9181 History of falling: Secondary | ICD-10-CM | POA: Diagnosis not present

## 2016-06-08 DIAGNOSIS — I129 Hypertensive chronic kidney disease with stage 1 through stage 4 chronic kidney disease, or unspecified chronic kidney disease: Secondary | ICD-10-CM | POA: Diagnosis not present

## 2016-06-08 DIAGNOSIS — I951 Orthostatic hypotension: Secondary | ICD-10-CM | POA: Diagnosis not present

## 2016-06-08 DIAGNOSIS — Z7901 Long term (current) use of anticoagulants: Secondary | ICD-10-CM | POA: Diagnosis not present

## 2016-06-08 DIAGNOSIS — I5040 Unspecified combined systolic (congestive) and diastolic (congestive) heart failure: Secondary | ICD-10-CM | POA: Diagnosis not present

## 2016-06-08 DIAGNOSIS — D631 Anemia in chronic kidney disease: Secondary | ICD-10-CM | POA: Diagnosis not present

## 2016-06-08 DIAGNOSIS — N390 Urinary tract infection, site not specified: Secondary | ICD-10-CM | POA: Diagnosis not present

## 2016-06-08 DIAGNOSIS — I509 Heart failure, unspecified: Secondary | ICD-10-CM | POA: Diagnosis not present

## 2016-06-08 DIAGNOSIS — I10 Essential (primary) hypertension: Secondary | ICD-10-CM | POA: Diagnosis not present

## 2016-06-08 DIAGNOSIS — R2689 Other abnormalities of gait and mobility: Secondary | ICD-10-CM | POA: Diagnosis not present

## 2016-06-08 DIAGNOSIS — E119 Type 2 diabetes mellitus without complications: Secondary | ICD-10-CM | POA: Diagnosis not present

## 2016-06-08 DIAGNOSIS — D6832 Hemorrhagic disorder due to extrinsic circulating anticoagulants: Secondary | ICD-10-CM | POA: Diagnosis not present

## 2016-06-14 DIAGNOSIS — Z9181 History of falling: Secondary | ICD-10-CM | POA: Diagnosis not present

## 2016-06-14 DIAGNOSIS — I5040 Unspecified combined systolic (congestive) and diastolic (congestive) heart failure: Secondary | ICD-10-CM | POA: Diagnosis not present

## 2016-06-14 DIAGNOSIS — E119 Type 2 diabetes mellitus without complications: Secondary | ICD-10-CM | POA: Diagnosis not present

## 2016-06-14 DIAGNOSIS — Z7901 Long term (current) use of anticoagulants: Secondary | ICD-10-CM | POA: Diagnosis not present

## 2016-06-14 DIAGNOSIS — I10 Essential (primary) hypertension: Secondary | ICD-10-CM | POA: Diagnosis not present

## 2016-06-14 DIAGNOSIS — Z794 Long term (current) use of insulin: Secondary | ICD-10-CM | POA: Diagnosis not present

## 2016-06-14 DIAGNOSIS — R2689 Other abnormalities of gait and mobility: Secondary | ICD-10-CM | POA: Diagnosis not present

## 2016-06-14 DIAGNOSIS — D6832 Hemorrhagic disorder due to extrinsic circulating anticoagulants: Secondary | ICD-10-CM | POA: Diagnosis not present

## 2016-06-17 DIAGNOSIS — Z7901 Long term (current) use of anticoagulants: Secondary | ICD-10-CM | POA: Diagnosis not present

## 2016-07-05 DIAGNOSIS — Z7901 Long term (current) use of anticoagulants: Secondary | ICD-10-CM | POA: Diagnosis not present

## 2016-07-07 DIAGNOSIS — I129 Hypertensive chronic kidney disease with stage 1 through stage 4 chronic kidney disease, or unspecified chronic kidney disease: Secondary | ICD-10-CM | POA: Diagnosis not present

## 2016-07-07 DIAGNOSIS — I509 Heart failure, unspecified: Secondary | ICD-10-CM | POA: Diagnosis not present

## 2016-07-07 DIAGNOSIS — I951 Orthostatic hypotension: Secondary | ICD-10-CM | POA: Diagnosis not present

## 2016-07-07 DIAGNOSIS — N183 Chronic kidney disease, stage 3 (moderate): Secondary | ICD-10-CM | POA: Diagnosis not present

## 2016-07-21 ENCOUNTER — Telehealth: Payer: Self-pay | Admitting: *Deleted

## 2016-07-21 NOTE — Telephone Encounter (Signed)
Patient called to report that per [facility], the atenolol is on backorder and will not be available until November. She was instructed to call our office to see what she could take in place of it. Patient can be reached at [phone]. Please advise. Thanks, MI

## 2016-07-21 NOTE — Telephone Encounter (Signed)
**Note De-Identified  Obfuscation** I offered to call other pharmacies in the pts area to see if they had any Atenolol available. The pt was in agreement and asked me to call [facility] in [facility]. I called that pharmacy and was advised that they do have Atenolol.  I ordered #30 tablets. I called the pt and advised her that she can pick up her refill from [facility] in [facility] She was very appreciative and thanked me for my help.

## 2016-08-08 DIAGNOSIS — Z7901 Long term (current) use of anticoagulants: Secondary | ICD-10-CM | POA: Diagnosis not present

## 2016-08-08 DIAGNOSIS — I1310 Hypertensive heart and chronic kidney disease without heart failure, with stage 1 through stage 4 chronic kidney disease, or unspecified chronic kidney disease: Secondary | ICD-10-CM | POA: Diagnosis not present

## 2016-08-08 DIAGNOSIS — M791 Myalgia: Secondary | ICD-10-CM | POA: Diagnosis not present

## 2016-08-08 DIAGNOSIS — N183 Chronic kidney disease, stage 3 (moderate): Secondary | ICD-10-CM | POA: Diagnosis not present

## 2016-08-08 DIAGNOSIS — I482 Chronic atrial fibrillation: Secondary | ICD-10-CM | POA: Diagnosis not present

## 2016-08-22 ENCOUNTER — Other Ambulatory Visit: Payer: Self-pay | Admitting: Interventional Cardiology

## 2016-08-30 DIAGNOSIS — H2512 Age-related nuclear cataract, left eye: Secondary | ICD-10-CM | POA: Diagnosis not present

## 2016-08-30 DIAGNOSIS — H18413 Arcus senilis, bilateral: Secondary | ICD-10-CM | POA: Diagnosis not present

## 2016-08-30 DIAGNOSIS — H25012 Cortical age-related cataract, left eye: Secondary | ICD-10-CM | POA: Diagnosis not present

## 2016-09-01 ENCOUNTER — Telehealth: Payer: Self-pay

## 2016-09-01 NOTE — Telephone Encounter (Signed)
**Note De-Identified  Obfuscation** The pt needs to have cataract extraction w/intraocular lens implantation of the left eye.  They are requesting cardiac clearance.  FYI: the pt is taking Coumadin.  Please advise.

## 2016-09-02 NOTE — Telephone Encounter (Signed)
No further testing needed for cataract surgery.  OK to stay on Coumadin if ok with the ophthalmologist.

## 2016-09-06 NOTE — Telephone Encounter (Signed)
This message has been faxed to [place] [employer] [place] @ [phone] ATTN: Sharyn Lull. I did receive confirmation that the fax was successful.

## 2016-09-08 DIAGNOSIS — Z1389 Encounter for screening for other disorder: Secondary | ICD-10-CM | POA: Diagnosis not present

## 2016-09-08 DIAGNOSIS — Z7902 Long term (current) use of antithrombotics/antiplatelets: Secondary | ICD-10-CM | POA: Diagnosis not present

## 2016-09-08 DIAGNOSIS — I1310 Hypertensive heart and chronic kidney disease without heart failure, with stage 1 through stage 4 chronic kidney disease, or unspecified chronic kidney disease: Secondary | ICD-10-CM | POA: Diagnosis not present

## 2016-09-08 DIAGNOSIS — I482 Chronic atrial fibrillation: Secondary | ICD-10-CM | POA: Diagnosis not present

## 2016-09-08 DIAGNOSIS — N183 Chronic kidney disease, stage 3 (moderate): Secondary | ICD-10-CM | POA: Diagnosis not present

## 2016-09-13 DIAGNOSIS — N189 Chronic kidney disease, unspecified: Secondary | ICD-10-CM | POA: Diagnosis not present

## 2016-09-13 DIAGNOSIS — I10 Essential (primary) hypertension: Secondary | ICD-10-CM | POA: Diagnosis not present

## 2016-09-13 DIAGNOSIS — E1165 Type 2 diabetes mellitus with hyperglycemia: Secondary | ICD-10-CM | POA: Diagnosis not present

## 2016-09-13 DIAGNOSIS — E7800 Pure hypercholesterolemia, unspecified: Secondary | ICD-10-CM | POA: Diagnosis not present

## 2016-09-21 DIAGNOSIS — I509 Heart failure, unspecified: Secondary | ICD-10-CM | POA: Diagnosis not present

## 2016-09-21 DIAGNOSIS — I951 Orthostatic hypotension: Secondary | ICD-10-CM | POA: Diagnosis not present

## 2016-09-21 DIAGNOSIS — N183 Chronic kidney disease, stage 3 (moderate): Secondary | ICD-10-CM | POA: Diagnosis not present

## 2016-09-21 DIAGNOSIS — I129 Hypertensive chronic kidney disease with stage 1 through stage 4 chronic kidney disease, or unspecified chronic kidney disease: Secondary | ICD-10-CM | POA: Diagnosis not present

## 2016-10-09 ENCOUNTER — Other Ambulatory Visit: Payer: Self-pay | Admitting: Interventional Cardiology

## 2016-10-09 NOTE — Progress Notes (Signed)
Cardiology Office Note   Date:  10/10/2016   ID:  [name], DOB June 29, 1933, MRN [id number]  PCP:  Stephanie Greenland, MD    No chief complaint on file. diastolic heart failure   Wt Readings from Last 3 Encounters:  10/10/16 203 lb (92.1 kg)  04/08/16 211 lb 12.8 oz (96.1 kg)  01/19/16 213 lb 1.9 oz (96.7 kg)       History of Present Illness: [name] is a 82 y.o. female  who has had diastolic dysfunction in the past. She is on Coumadin for DVT. She has had atrial arrhythmias and chronic [name] edema.   In addition to the history of h/o DVT (on Coumadin for this), chronic diastolic CHF, she also has deconditioning, obesity, HTN, SVT (correlated with pre-syncope, on amiodarone), DM, chronic anemia/thrombocytopenia, LBBB, probable CKD stage III (Cr 1.3 in 08/2014), mod MR by echo 2014 who presents for f/u. Per notes in 2013 she had an EPS with ablation for SVT which did not eliminate the SVT completely. She also had bradycardia which limited medication. She was placed on amiodarone by [name]. Last echo 07/2013: mild LVH, moderate focal basal hypertrophy of septum, EF 55-60%, grade 1 DD, mod MR, mod LAE, PASP 40.  She sustained a mechanical fall in 07/2015 and was in the hospital for a week then rehab until [name]. She has since been discharged and is doing PT/OT at home. She has noticed increase in her chronic edema. She and her daughter said she gained weight at SNF but her weight is actually down about 10 lbs here compared to last year. Reports compliance with Lasix, low sodium diet, and does not drink excess fluid. UOP as expected. No CP, SOB, presyncope or syncope. INR is followed by [facility].  She was seen in [facility] 2017 for fluid overload.  Lasix was increased and potassium was increased.  SHe was concerned that he was taking too much diuretic.  She is bothered by arthritis.  SHe has had some stomach pain.  She fell again- no broken bones, only bruising.  BP   Has been well controlled with nurse visits at her home.  Followed by [name].   She is seen by the nephrologist for stage III kidney disease.  She is on a fluid restriction.    She has not had an INR checked since 2-3 months ago.  Cr 1.62 ion 09/21/16.  THere was difficulty getting her atenolol.  It was changed but she does not know the name, starts with an M..  ?metoprolol. She needs laser surgery on her left eye.   She may go to [facility] for this.    Past Medical History:  Diagnosis Date  . Anemia   . Chronic diastolic CHF (congestive heart failure) ([place])   . CKD (chronic kidney disease), stage III   . Diabetes mellitus ([name])   . Dizziness and giddiness   . DVT (deep venous thrombosis) ([place])    a. On Coumadin for this.  . Essential hypertension   . LBBB (left bundle branch block)   . Mitral regurgitation    a. Mod MR by echo 2014.  . Muscular deconditioning   . Obesity   . SVT (supraventricular tachycardia) ([place])    a. In 2013 she had an EPS with ablation for SVT which did not eliminate the SVT completely. She also had bradycardia which limited medication. She was placed on amiodarone by [name].  . Thrombocytopenia ([name]) 11/21/2011  Past Surgical History:  Procedure Laterality Date  . SUPRAVENTRICULAR TACHYCARDIA ABLATION N/A 10/11/2012   Procedure: SUPRAVENTRICULAR TACHYCARDIA ABLATION;  Surgeon: [name] Lance, MD;  Location: [facility] CATH LAB;  Service: Cardiovascular;  Laterality: N/A;     Current Outpatient Prescriptions  Medication Sig Dispense Refill  . ACCU-CHEK AVIVA PLUS test strip USE AS DIRECTED TO CHECK BLOOD SUGAR QID  11  . amiodarone (PACERONE) 200 MG tablet TAKE ONE TABLET BY MOUTH DAILY 90 tablet 3  . amLODipine (NORVASC) 5 MG tablet TAKE 1 TABLET(5 MG) BY MOUTH DAILY 30 tablet 10  . atenolol (TENORMIN) 50 MG tablet TAKE ONE (1) TABLET BY MOUTH ONCE DAILY 30 tablet 11  . atorvastatin (LIPITOR) 20 MG tablet Take 20 mg by mouth daily.    . BD PEN  NEEDLE NANO U/F 32G X 4 MM MISC U UTD QID FOR 30 DAYS  0  . calcium carbonate (OS-CAL) 600 MG TABS Take 600 mg by mouth 2 (two) times daily with a meal.    . Colesevelam HCl 3.75 G PACK Take 3.75 g by mouth daily.    . ferrous gluconate (FERGON) 324 MG tablet Take 325 mg by mouth daily with breakfast.     . furosemide (LASIX) 80 MG tablet Take 2 tablets by mouth in the morning and 1 tablet by mouth in the evening X's 1 then go back to 1 tablet by mouth in the morning and 1 tablet by mouth in the evening 60 tablet 9  . gabapentin (NEURONTIN) 100 MG capsule     . HUMALOG KWIKPEN 100 UNIT/ML KiwkPen Inject into the skin 3 (three) times daily. AS DIRECTED  3  . LEVEMIR FLEXTOUCH 100 UNIT/ML Pen Inject 12 units in the am and 12 units at night into skin as directed  4  . losartan (COZAAR) 100 MG tablet Take 50 mg by mouth daily.  2  . magnesium oxide (MAG-OX) 400 MG tablet Take 400 mg by mouth daily.    [name] omeprazole (PRILOSEC) 40 MG capsule Take 40 mg by mouth daily.    [name] oxybutynin (DITROPAN) 5 MG tablet Take 5 mg by mouth 2 (two) times daily.    . potassium chloride (MICRO-K) 10 MEQ CR capsule Take 4 tablets by mouth X's 1 day then go back to 2 tablets by mouth daily 180 capsule 1  . traMADol (ULTRAM) 50 MG tablet Take 50 mg by mouth every 6 (six) hours as needed for pain.    . Vitamin D, Ergocalciferol, (DRISDOL) 50000 UNITS CAPS Take 5,000 Units by mouth 2 (two) times a week.     . warfarin (COUMADIN) 10 MG tablet Take 10 mg by mouth daily. Takes 7.5 mg along with a 10 mg tablet DAILY,, TEUSDAY and THURSDAY takes 3.75 mg     No current facility-administered medications for this visit.     Allergies:   Aspirin and Penicillins    Social History:  The patient  reports that she has never smoked. She has never used smokeless tobacco. She reports that she does not drink alcohol or use drugs.   Family History:  The patient's family history includes Heart attack in her brother; Hypertension in her  daughter; Stroke in her mother and sister.    ROS:  Please see the history of present illness.   Otherwise, review of systems are positive for persistent swelling.   All other systems are reviewed and negative.    PHYSICAL EXAM: VS:  BP 124/82   Pulse 68  Ht 5\' 5"  (1.651 m)   Wt 203 lb (92.1 kg)   BMI 33.78 kg/m  , BMI Body mass index is 33.78 kg/m. GEN: Well nourished, well developed, in no acute distress  HEENT: normal  Neck: no JVD, carotid bruits, or masses Cardiac: RRR; no murmurs, rubs, or gallops,;bilateral pitting ankle and pretibial edema  Respiratory:  clear to auscultation bilaterally, normal work of breathing GI: soft, nontender, nondistended, + BS MS: no deformity or atrophy  Skin: warm and dry, no rash Neuro:  Strength and sensation are intact- uses a walker Psych: euthymic mood, full affect      Recent Labs: 01/19/2016: Brain Natriuretic Peptide 178.7; BUN 28; Creat 1.50; Potassium 4.1; Sodium 141   Lipid Panel    Component Value Date/Time   CHOL (H) 12/11/2008 0130    202        ATP III CLASSIFICATION:  <200     mg/dL   Desirable  200-239  mg/dL   Borderline High  >=240    mg/dL   High          TRIG 64 12/11/2008 0130   HDL 78 12/11/2008 0130   CHOLHDL 2.6 12/11/2008 0130   VLDL 13 12/11/2008 0130   LDLCALC (H) 12/11/2008 0130    111        Total Cholesterol/HDL:CHD Risk Coronary Heart Disease Risk Table                     Men   Women  1/2 Average Risk   3.4   3.3  Average Risk       5.0   4.4  2 X Average Risk   9.6   7.1  3 X Average Risk  23.4   11.0        Use the calculated Patient Ratio above and the CHD Risk Table to determine the patient's CHD Risk.        ATP III CLASSIFICATION (LDL):  <100     mg/dL   Optimal  100-129  mg/dL   Near or Above                    Optimal  130-159  mg/dL   Borderline  160-189  mg/dL   High  >190     mg/dL   Very High     Other studies Reviewed: Additional studies/ records that were reviewed  today with results demonstrating: awaiting lab results.   ASSESSMENT AND PLAN:  1. Chronic diastolic heart failure:  Continue Lasix.  Cr up to 1.62. Seen by Nephrology for CAD. Persistent edema.  COntinue to elevate legs as well.  Compression stockings will also help.  Also with some venous insufficiency.    2. SVT:  No fast heart beats.  Controlled.  3. HTN Controlled at nurse visits. 4. Hyperlipidemia: Followed by [name].  Continue atorvastatin.  5. DM: Insulin recently increased by Dr. Chalmers Cater.  6. Coumadin for DVT. She had not had this checked for some time so we checked today. INR was 5.1. We instructed her to hold her Coumadin for 2 days and then contact [name], and normally checks her INR for an appointment later this week to recheck INR.   Current medicines are reviewed at length with the patient today.  The patient concerns regarding her medicines were addressed.  The following changes have been made:  No change  Labs/ tests ordered today include: get recent results No orders of the defined  types were placed in this encounter.   Recommend 150 minutes/week of aerobic exercise- that can be done safely Low fat, low carb, high fiber diet recommended  Disposition:   FU in 9 months   Signed, Larae Grooms, MD  10/10/2016 12:34 PM    [facility] [place], [place], [place]  [address] Phone: [phone]; Fax: [phone]

## 2016-10-10 ENCOUNTER — Ambulatory Visit (INDEPENDENT_AMBULATORY_CARE_PROVIDER_SITE_OTHER): Payer: Medicare Other | Admitting: Interventional Cardiology

## 2016-10-10 ENCOUNTER — Encounter: Payer: Self-pay | Admitting: Interventional Cardiology

## 2016-10-10 VITALS — BP 124/82 | HR 68 | Ht 65.0 in | Wt 203.0 lb

## 2016-10-10 DIAGNOSIS — I10 Essential (primary) hypertension: Secondary | ICD-10-CM | POA: Diagnosis not present

## 2016-10-10 DIAGNOSIS — I471 Supraventricular tachycardia, unspecified: Secondary | ICD-10-CM

## 2016-10-10 DIAGNOSIS — I825Z9 Chronic embolism and thrombosis of unspecified deep veins of unspecified distal lower extremity: Secondary | ICD-10-CM | POA: Diagnosis not present

## 2016-10-10 DIAGNOSIS — E782 Mixed hyperlipidemia: Secondary | ICD-10-CM

## 2016-10-10 DIAGNOSIS — I5032 Chronic diastolic (congestive) heart failure: Secondary | ICD-10-CM

## 2016-10-10 DIAGNOSIS — Z7901 Long term (current) use of anticoagulants: Secondary | ICD-10-CM

## 2016-10-10 NOTE — Patient Instructions (Signed)
Medication Instructions:  Same-no changes  Labwork: None  Testing/Procedures: None  Follow-Up: Your physician wants you to follow-up in: 9 months. You will receive a reminder letter in the mail two months in advance. If you don't receive a letter, please call our office to schedule the follow-up appointment.     If you need a refill on your cardiac medications before your next appointment, please call your pharmacy.

## 2016-10-12 ENCOUNTER — Telehealth: Payer: Self-pay

## 2016-10-12 ENCOUNTER — Other Ambulatory Visit: Payer: Self-pay | Admitting: Interventional Cardiology

## 2016-10-12 NOTE — Telephone Encounter (Signed)
New Message  Pt call requesting to speak with RN. Pt states she was to call and let RN know the BP medication she was taking. Pt states she is taking Metoprolol 50mg  1x daily. Please call back to discuss

## 2016-10-12 NOTE — Telephone Encounter (Signed)
The pt states that she told Dr Irish Lack that she would call us back to let us know the name of a medication that she did not bring to her OV on 12/18 and she could not remember the name. She states that she is taking Metoprolol 50 mg daily. I have added that medication to her med list.  Will forward to Dr Irish Lack for his review.

## 2016-10-12 NOTE — Telephone Encounter (Signed)
The pt states that she told Dr Irish Lack that she would call us back to let us know the name of a medication that she did not bring to her OV on 12/18 and she could not remember the name. She states that she is taking Metoprolol 50 mg daily. I have added that medication to her med list.  Will forward to Dr Irish Lack for his review.   Documentation     Tamera Reason routed conversation to You 49 minutes ago (4:11 PM)    [place] 58 minutes ago (4:02 PM)    New Message  Pt call requesting to speak with RN. Pt states she was to call and let RN know the BP medication she was taking. Pt states she is taking Metoprolol 50mg  1x daily. Please call back to discuss    Documentation     Tyshauna F. Kolden [phone]  Kennis Carina Simpson 58 minutes ago (4:01 PM)

## 2016-10-12 NOTE — Telephone Encounter (Signed)
Note was started as a refill so I cant forward to Dr Irish Lack. I have started a new phone note and sent it to Dr Irish Lack.

## 2016-10-12 NOTE — Telephone Encounter (Signed)
Please take atenolol off the med list.  This was stopped and replaced with metoprolol.

## 2016-10-13 NOTE — Telephone Encounter (Signed)
Atenolol 50 mg was discontinued as per Dr Irish Lack MD order and . Metoprolol 50 mg added to pt's medication list by Jeani Hawking Via LPN.[name]

## 2016-10-13 NOTE — Telephone Encounter (Signed)
OK to refill metoprolol.

## 2016-10-14 DIAGNOSIS — Z7901 Long term (current) use of anticoagulants: Secondary | ICD-10-CM | POA: Diagnosis not present

## 2016-11-15 ENCOUNTER — Ambulatory Visit: Payer: Self-pay

## 2016-11-16 ENCOUNTER — Other Ambulatory Visit: Payer: Self-pay | Admitting: *Deleted

## 2016-11-16 NOTE — Patient Outreach (Signed)
[facility]) Care Management  11/16/2016  [name] [name] 06/07/1933 [id number]   Initial attempt to reach pt however unsuccessful. Unable to leave a HIPAA approved voice message however RN will continue attempts to reach this pt for possible [facility] services.   Raina Mina, RN Care Management Coordinator [place] Office [phone]

## 2016-11-17 ENCOUNTER — Other Ambulatory Visit: Payer: Self-pay | Admitting: *Deleted

## 2016-11-17 NOTE — Patient Outreach (Signed)
[place] [facility]) Care Management  11/17/2016  JACKY HARTUNG [birth year] [id number]   RN second attempt to reach pt however unsuccessful. RN able to leave a HIPAA approved voice message requesting a call back. Will continue attempts to reach pt for possible services.  Raina Mina, RN Care Management Coordinator [place] Office [phone]

## 2016-11-18 ENCOUNTER — Other Ambulatory Visit: Payer: Self-pay | Admitting: *Deleted

## 2016-11-18 NOTE — Patient Outreach (Signed)
[facility]) Care Management  11/18/2016  [name] 16-May-1933 [id number]   Assisting Mariann Laster, RN with screening calls.  RN spoke briefly with pt's today and introduced the case manager with [facility] and purpose for today's call. Pt reports she is currently with an agency "social services" at the time of this call. RN offered to follow up at a later time and day. Pt receptive to a follow up on Monday. RN will follow up accordingly and proceed with a [facility] screening for possible needs and services.  Raina Mina, RN Care Management Coordinator [place] Office [phone]

## 2016-11-22 ENCOUNTER — Other Ambulatory Visit: Payer: Self-pay | Admitting: *Deleted

## 2016-11-22 DIAGNOSIS — Z794 Long term (current) use of insulin: Secondary | ICD-10-CM

## 2016-11-22 DIAGNOSIS — E118 Type 2 diabetes mellitus with unspecified complications: Secondary | ICD-10-CM

## 2016-11-22 NOTE — Patient Outreach (Signed)
[facility]) Care Management  11/22/2016  TRANISE FORREST May 20, 1933 [id number]  Assisting Crystal Hutchinson, RN with screening   RN spoke with pt and reintroduced the [facility] services and purpose for today's call. Completed a screening today for possible interventions with [facility] services. Pt reports she has a Museum/gallery conservator who visits monthly along with DSS for 10 hours week for aide services. Pt states her case worker recently visited with these ongoing arrangements. Pt also states she is pending for [employer] (pt on the waiting list). Pt states she still drives herself to and from her appointments but has supportive children to assist if needed.  Pt detail her issues with the following information: HTN: Pt states her BP is under control with no reported issues. Pt reports she is cutting back on her sodium intake and exercising more to improve her numbers. Pt is aware when she gets a headache states she takes Tylenol and this improves her pain but this is "rare". Pt aware of the risk of not monitoring her blood pressure.  ATRIAL FIBRILLATION: Pt reports no episodes and this is being followed by her cardiologist. HEART FAILURE: Pt reports she has a HF scale device that was provided by the HF clinic that reports all her data each day. No reported issues or problems as pt states she has had this device for "years". RN continued to review the HF zones involving the GREEN/YELLOW/RED and verified pt remains in the GREEN zone today once the zones were reviewed for pt's knowledge base. DIABETES: Pt reports her readings vary from 111-200 with 3 finger sticks daily and SSI with her meals. Pt states she has an endocrinologist and has had several conversations about her nutritional habits. Pt states she is watching what she eats and exercising when she can (neuropathy-knees). RN further educated pt on dietary measures such as monitoring he carbohydrates and portion sizes.   PLAN: Offered community case  management however pt opt to declined but was receptive to a referral for [employer] for disease management (Diabetes).   Raina Mina, RN Care Management Coordinator [place] Office [phone]

## 2016-11-29 ENCOUNTER — Other Ambulatory Visit: Payer: Self-pay

## 2016-11-29 DIAGNOSIS — E118 Type 2 diabetes mellitus with unspecified complications: Secondary | ICD-10-CM

## 2016-11-29 DIAGNOSIS — Z794 Long term (current) use of insulin: Secondary | ICD-10-CM

## 2016-11-29 NOTE — Patient Outreach (Signed)
[facility]) Care Management  [place]  11/29/2016   [name] Jun 01, 1933 [id number]  Telephonic initial assessment completed past referral from screening on 11-22-16.  Patient has history including CHF, HTN, SVT, and DM II.  Admission completed for DM II self-management needs.  Encounter Medications:  Outpatient Encounter Prescriptions as of 11/29/2016  Medication Sig  . ACCU-CHEK AVIVA PLUS test strip USE AS DIRECTED TO CHECK BLOOD SUGAR QID  . amiodarone (PACERONE) 200 MG tablet TAKE 1 TABLET BY MOUTH DAILY  . amLODipine (NORVASC) 5 MG tablet TAKE 1 TABLET(5 MG) BY MOUTH DAILY  . atorvastatin (LIPITOR) 20 MG tablet Take 20 mg by mouth daily.  . BD PEN NEEDLE NANO U/F 32G X 4 MM MISC U UTD QID FOR 30 DAYS  . calcium carbonate (OS-CAL) 600 MG TABS Take 600 mg by mouth 2 (two) times daily with a meal.  . Colesevelam HCl 3.75 G PACK Take 3.75 g by mouth daily.  . ferrous gluconate (FERGON) 324 MG tablet Take 325 mg by mouth daily with breakfast.   . furosemide (LASIX) 80 MG tablet Take 2 tablets by mouth in the morning and 1 tablet by mouth in the evening X's 1 then go back to 1 tablet by mouth in the morning and 1 tablet by mouth in the evening  . gabapentin (NEURONTIN) 100 MG capsule   . HUMALOG KWIKPEN 100 UNIT/ML KiwkPen Inject into the skin 3 (three) times daily. AS DIRECTED  . LEVEMIR FLEXTOUCH 100 UNIT/ML Pen Inject 12 units in the am and 12 units at night into skin as directed  . losartan (COZAAR) 100 MG tablet Take 50 mg by mouth daily.  . magnesium oxide (MAG-OX) 400 MG tablet Take 400 mg by mouth daily.  . metoprolol succinate (TOPROL-XL) 50 MG 24 hr tablet Take 1 tablet (50 mg total) by mouth daily. Take with or immediately following a meal.  . omeprazole (PRILOSEC) 40 MG capsule Take 40 mg by mouth daily.  [name] oxybutynin (DITROPAN) 5 MG tablet Take 5 mg by mouth 2 (two) times daily.  . potassium chloride (MICRO-K) 10 MEQ CR capsule Take 4 tablets by  mouth X's 1 day then go back to 2 tablets by mouth daily  . traMADol (ULTRAM) 50 MG tablet Take 50 mg by mouth every 6 (six) hours as needed for pain.  . Vitamin D, Ergocalciferol, (DRISDOL) 50000 UNITS CAPS Take 5,000 Units by mouth 2 (two) times a week.   . warfarin (COUMADIN) 10 MG tablet Take 10 mg by mouth daily. Takes 7.5 mg along with a 10 mg tablet DAILY,, TEUSDAY and THURSDAY takes 3.75 mg   No facility-administered encounter medications on file as of 11/29/2016.     Functional Status:  In your present state of health, do you have any difficulty performing the following activities: 11/29/2016  Hearing? N  Vision? N  Difficulty concentrating or making decisions? N  Walking or climbing stairs? Y  Dressing or bathing? Y  Doing errands, shopping? Y  Preparing Food and eating ? N  Using the Toilet? N  In the past six months, have you accidently leaked urine? Y  Do you have problems with loss of bowel control? N  Managing your Medications? N  Managing your Finances? N  Housekeeping or managing your Housekeeping? Y  Some recent data might be hidden    Fall/Depression Screening: PHQ 2/9 Scores 11/29/2016 11/22/2016  PHQ - 2 Score 0 0    Assessment: Patient is insulin  dependent:  States she takes Humolog SSI TID and Levemir.                          Patient will benefit from disease management calls to educate and support self-management                         Guidelines. [facility] CM Care Plan Problem One   Flowsheet Row Most Recent Value  Care Plan Problem One  Chronic disease self management needs related to Diabetes  Role Documenting the Problem One  [place] for Problem One  Active  [facility]N Long Term Goal (31-90 days)  Patient will avoid hospitalization over the next 90 days.  [facility]N Long Term Goal Start Date  11/29/16  Interventions for Problem One Long Term Goal  Reviewed diabetic self-care management including diet, monitoring cbgs, taking meds as ordered (including SSI).   [facility] CM Short Term Goal #1 (0-30 days)  Patient will record daily cbgs and SSI dosages.  [facility] CM Short Term Goal #1 Start Date  11/29/16  Interventions for Short Term Goal #1  Reviewed importance of monitoring cbgs and taking SSI as directed.    [facility] CM Care Plan Problem Two   Flowsheet Row Most Recent Value  Care Plan Problem Two  Patient states she needs a dental appointment.  Role Documenting the Problem Two  [place] for Problem Two  Active  [facility] CM Short Term Goal #1 (0-30 days)  Patient will contact [place] and inquire about network dentists and schedule an appointment within 30 days.  [facility] CM Short Term Goal #1 Start Date  11/29/16  Interventions for Short Term Goal #2   Discussed with patient calling insurance customer service number and request assistance with scheduling dental care.    [facility] CM Care Plan Problem Three   Flowsheet Row Most Recent Value  Care Plan Problem Three  Patient has not completed advanced directives.  Role Documenting the Problem [place] for Problem Three  Active  [facility]N CM Short Term Goal #1 (0-30 days)  Patient will complete advanced directives within 30 days.  [facility] CM Short Term Goal #1 Start Date  11/29/16  Interventions for Short Term Goal #1  RN will mail information packet.  RN offered SW assistance with completion of forms if she desires.     Plan: Patient will keep a written record of cbgs and SSI insulin dosing.           RN will send [employer] and [employer] information.           RN will follow up within 3 weeks.  Candie Mile, RN, MSN [place] [phone] Fax [phone]

## 2016-12-13 ENCOUNTER — Other Ambulatory Visit: Payer: Self-pay

## 2016-12-13 VITALS — Ht 65.0 in | Wt 197.0 lb

## 2016-12-13 DIAGNOSIS — E118 Type 2 diabetes mellitus with unspecified complications: Secondary | ICD-10-CM

## 2016-12-13 DIAGNOSIS — Z794 Long term (current) use of insulin: Secondary | ICD-10-CM

## 2016-12-13 NOTE — Patient Outreach (Signed)
[place] [facility]) Care Management  12/13/2016  [name] [birth year] [id number]  Telephone call to patient.  She reports she has been regularly taking and recording cbgs, and that she is using her sliding scale insulin per orders.  Fasting cbg this am reported to be 180.   Patient is on Coumadin for tachycardia, but states she has not had a PT for several weeks.  Encouraged her to call PCP about scheduling lab appointment.  She agreed to call today.  Plan:  Patient will continue monitoring cbgs and taking meds as ordered.           Patient will schedule PT and dental appointments.           RN will resend [employer] and Therapist, occupational will follow up in March.  Candie Mile, RN, MSN [place] [phone] Fax [phone]

## 2016-12-23 DIAGNOSIS — I4891 Unspecified atrial fibrillation: Secondary | ICD-10-CM | POA: Diagnosis not present

## 2016-12-23 DIAGNOSIS — E1122 Type 2 diabetes mellitus with diabetic chronic kidney disease: Secondary | ICD-10-CM | POA: Diagnosis not present

## 2016-12-23 DIAGNOSIS — I130 Hypertensive heart and chronic kidney disease with heart failure and stage 1 through stage 4 chronic kidney disease, or unspecified chronic kidney disease: Secondary | ICD-10-CM | POA: Diagnosis not present

## 2016-12-23 DIAGNOSIS — Z7901 Long term (current) use of anticoagulants: Secondary | ICD-10-CM | POA: Diagnosis not present

## 2016-12-29 DIAGNOSIS — I129 Hypertensive chronic kidney disease with stage 1 through stage 4 chronic kidney disease, or unspecified chronic kidney disease: Secondary | ICD-10-CM | POA: Diagnosis not present

## 2016-12-29 DIAGNOSIS — N183 Chronic kidney disease, stage 3 (moderate): Secondary | ICD-10-CM | POA: Diagnosis not present

## 2016-12-29 DIAGNOSIS — I509 Heart failure, unspecified: Secondary | ICD-10-CM | POA: Diagnosis not present

## 2016-12-29 DIAGNOSIS — I951 Orthostatic hypotension: Secondary | ICD-10-CM | POA: Diagnosis not present

## 2017-01-10 ENCOUNTER — Other Ambulatory Visit: Payer: Self-pay | Admitting: Interventional Cardiology

## 2017-01-10 ENCOUNTER — Ambulatory Visit: Payer: Self-pay

## 2017-01-13 ENCOUNTER — Other Ambulatory Visit: Payer: Self-pay

## 2017-01-13 DIAGNOSIS — E118 Type 2 diabetes mellitus with unspecified complications: Secondary | ICD-10-CM

## 2017-01-13 DIAGNOSIS — Z794 Long term (current) use of insulin: Secondary | ICD-10-CM

## 2017-01-13 NOTE — Patient Outreach (Signed)
[facility]) Care Management  01/13/2017  TANAJA GANGER 1932/10/30 [id number]   Telephonic assessment with patient.  She reports doing well except for leg pain, the cause of which she is not sure.  She states she has an appointment on Monday to see a rheumatologist, because she thinks she may have arthritis. She reports she is taking Tylenol with good relief.  Patient states she is waiting for a call back today about her Coumadin dosage/lab report.  Encouraged her to call the office back if she does not hear from them by 4PM this afternoon.  Plan:  Patient will adjust her Coumadin dosage based on MD recommendation.           Patient will keep MD appointments.           Patient will continue monitoring cbgs and watching what she eats.           RN will follow up in April.  Candie Mile, RN, MSN [place] [phone] Fax [phone]

## 2017-01-19 ENCOUNTER — Ambulatory Visit: Payer: Medicare Other

## 2017-01-21 ENCOUNTER — Other Ambulatory Visit: Payer: Self-pay | Admitting: Interventional Cardiology

## 2017-01-23 ENCOUNTER — Other Ambulatory Visit: Payer: Self-pay | Admitting: *Deleted

## 2017-02-01 DIAGNOSIS — M48061 Spinal stenosis, lumbar region without neurogenic claudication: Secondary | ICD-10-CM | POA: Diagnosis not present

## 2017-02-06 DIAGNOSIS — Z7901 Long term (current) use of anticoagulants: Secondary | ICD-10-CM | POA: Diagnosis not present

## 2017-02-08 DIAGNOSIS — M545 Low back pain: Secondary | ICD-10-CM | POA: Diagnosis not present

## 2017-02-15 ENCOUNTER — Other Ambulatory Visit: Payer: Self-pay

## 2017-02-15 ENCOUNTER — Ambulatory Visit: Payer: Self-pay

## 2017-02-15 VITALS — Wt 199.0 lb

## 2017-02-15 DIAGNOSIS — Z794 Long term (current) use of insulin: Secondary | ICD-10-CM

## 2017-02-15 DIAGNOSIS — E118 Type 2 diabetes mellitus with unspecified complications: Secondary | ICD-10-CM

## 2017-02-15 NOTE — Patient Outreach (Signed)
[facility]) Care Management  02/15/2017  [name] 04/09/1933 [id number]   Telephonic assessment with patient.  She reports no new problems.  She states she has had an MRI to evaluate her leg pain but has not yet been called with the results.  Patient reports keeping daily log of weights and cbgs. Weight has been steady.  She reported no s/s hypo/hyperglycemia.  Patient has not yet pursued a dental appointment, but reports that she plans to do so.  Plan: RN will follow up in May.  Candie Mile, RN, MSN [place] [phone] Fax [phone]

## 2017-02-27 DIAGNOSIS — M25561 Pain in right knee: Secondary | ICD-10-CM | POA: Diagnosis not present

## 2017-02-27 DIAGNOSIS — M25562 Pain in left knee: Secondary | ICD-10-CM | POA: Diagnosis not present

## 2017-02-27 DIAGNOSIS — Z7901 Long term (current) use of anticoagulants: Secondary | ICD-10-CM | POA: Diagnosis not present

## 2017-02-27 DIAGNOSIS — M48061 Spinal stenosis, lumbar region without neurogenic claudication: Secondary | ICD-10-CM | POA: Diagnosis not present

## 2017-03-01 ENCOUNTER — Telehealth: Payer: Self-pay | Admitting: Interventional Cardiology

## 2017-03-01 NOTE — Telephone Encounter (Signed)
Patient made aware of Dr. Hassell Done recommendations. Patient verbalized understanding and appreciated the call.

## 2017-03-01 NOTE — Telephone Encounter (Signed)
New message    Caryl Pina, RN from [facility] is calling for pt. Pt weight is up 3.3 lbs in the past week.   Pt c/o swelling: STAT is pt has developed SOB within 24 hours  1. How long have you been experiencing swelling? 3 days  2. Where is the swelling located? Feet  3.  Are you currently taking a "fluid pill"? Yes 80 mg of lasix twice daily. Per RN, pt has not missed a fluid pill.  4.  Are you currently SOB? no  5.  Have you traveled recently? No.

## 2017-03-01 NOTE — Telephone Encounter (Signed)
Try to elevate legs.  OK to take 120 mg in the morning and 80 in the evening for 3 days and then can return to 80 mg BID.

## 2017-03-01 NOTE — Telephone Encounter (Signed)
Called and spoke with patient. She states that she has gained 3.3 pounds in the last week. She states that she has had lower extremity edema. She states that she is only SOB on exertion. She states that she has been taking lasix 80 mg BID and potassium 20 MEQ BID. Discussed the importance of limiting salt in her diet. Message routed to MD for review and recommendations.

## 2017-03-02 DIAGNOSIS — M1712 Unilateral primary osteoarthritis, left knee: Secondary | ICD-10-CM | POA: Diagnosis not present

## 2017-03-02 DIAGNOSIS — M1711 Unilateral primary osteoarthritis, right knee: Secondary | ICD-10-CM | POA: Diagnosis not present

## 2017-03-07 ENCOUNTER — Telehealth: Payer: Self-pay

## 2017-03-07 NOTE — Telephone Encounter (Signed)
Pt on warfarin for DVT and not followed by our clinic. It is somewhat unclear per our record how many DVT(s) she has had. Would route to prescribing provider for clearance.

## 2017-03-07 NOTE — Telephone Encounter (Signed)
No further cardiac testing needed before surgery. Await word from [name] regarding Coumadin management.

## 2017-03-07 NOTE — Telephone Encounter (Signed)
Request for cardiac clearance:  1. What type of surgery is being performed? R TKA   2. When is this surgery scheduled? TBD   3. Are there any medications that need to be held prior to surgery and how long? Coumadin  4. Name of physician performing surgery? Dr. Mayer Camel   5. What is your office phone and fax number? P- [phone], [phone]

## 2017-03-07 NOTE — Telephone Encounter (Signed)
Left a message for [name] (patient's PCP) at her office to call back and advise on how many DVTs patient has had and if she would like to advise on how long to hold coumadin prior to surgery.

## 2017-03-09 NOTE — Telephone Encounter (Signed)
Called and spoke with [name] regarding how long the patient can hold her coumadin prior to her procedure.   [name] states that she can hold her coumadin 5 days prior to her procedure.

## 2017-03-09 NOTE — Telephone Encounter (Signed)
Clearance faxed to Dr. Damita Dunnings office attn: Juliann Pulse at [phone]. Confirmation received that fax went through.

## 2017-03-15 ENCOUNTER — Other Ambulatory Visit: Payer: Self-pay

## 2017-03-15 NOTE — Patient Outreach (Signed)
[facility]) Care Management  03/15/2017  [name] 1933-03-10 [id number]  Reached patient for monthly follow up.  HIPAA identifiers confirmed.  Patient stated she was just getting out of the shower, and requested that I call her back next week.  Plan:  Reschedule contact.  Candie Mile, RN, MSN [place] [phone] Fax [phone]

## 2017-03-16 DIAGNOSIS — E1122 Type 2 diabetes mellitus with diabetic chronic kidney disease: Secondary | ICD-10-CM | POA: Diagnosis not present

## 2017-03-16 DIAGNOSIS — I4891 Unspecified atrial fibrillation: Secondary | ICD-10-CM | POA: Diagnosis not present

## 2017-03-16 DIAGNOSIS — Z01818 Encounter for other preprocedural examination: Secondary | ICD-10-CM | POA: Diagnosis not present

## 2017-03-16 DIAGNOSIS — M25561 Pain in right knee: Secondary | ICD-10-CM | POA: Diagnosis not present

## 2017-03-16 DIAGNOSIS — N183 Chronic kidney disease, stage 3 (moderate): Secondary | ICD-10-CM | POA: Diagnosis not present

## 2017-03-21 DIAGNOSIS — E1165 Type 2 diabetes mellitus with hyperglycemia: Secondary | ICD-10-CM | POA: Diagnosis not present

## 2017-03-21 DIAGNOSIS — E7800 Pure hypercholesterolemia, unspecified: Secondary | ICD-10-CM | POA: Diagnosis not present

## 2017-03-21 DIAGNOSIS — I10 Essential (primary) hypertension: Secondary | ICD-10-CM | POA: Diagnosis not present

## 2017-03-21 DIAGNOSIS — N189 Chronic kidney disease, unspecified: Secondary | ICD-10-CM | POA: Diagnosis not present

## 2017-03-22 ENCOUNTER — Other Ambulatory Visit: Payer: Self-pay

## 2017-03-22 VITALS — Wt 199.5 lb

## 2017-03-22 DIAGNOSIS — E118 Type 2 diabetes mellitus with unspecified complications: Secondary | ICD-10-CM

## 2017-03-22 DIAGNOSIS — Z794 Long term (current) use of insulin: Secondary | ICD-10-CM

## 2017-03-22 NOTE — Patient Outreach (Signed)
[facility]) Care Management  [place]  03/22/2017   [name] 12/31/1932 [id number]  Telephone contact with patient.  She reports she saw her MD on 03-17-17, and was told to watch her salt intake and to cut back on sugar.  RN reviewed dietary guidelines for low salt, diabetic diet.  Patient reported she did receive the educational materials mailed to her, and that she found them helpful.  She did admit to liking processed meats for sandwiches, and that she finds it difficult to cut back on sweets and bread.  Encounter Medications:  Outpatient Encounter Prescriptions as of 03/22/2017  Medication Sig  . ACCU-CHEK AVIVA PLUS test strip USE AS DIRECTED TO CHECK BLOOD SUGAR QID  . amiodarone (PACERONE) 200 MG tablet TAKE 1 TABLET BY MOUTH DAILY  . amLODipine (NORVASC) 5 MG tablet TAKE 1 TABLET(5 MG) BY MOUTH DAILY  . atorvastatin (LIPITOR) 20 MG tablet Take 20 mg by mouth daily.  . BD PEN NEEDLE NANO U/F 32G X 4 MM MISC U UTD QID FOR 30 DAYS  . calcium carbonate (OS-CAL) 600 MG TABS Take 600 mg by mouth 2 (two) times daily with a meal.  . Colesevelam HCl 3.75 G PACK Take 3.75 g by mouth daily.  . ferrous gluconate (FERGON) 324 MG tablet Take 325 mg by mouth daily with breakfast.   . furosemide (LASIX) 80 MG tablet TAKE 1 TABLET BY MOUTH TWICE DAILY  . gabapentin (NEURONTIN) 100 MG capsule   . HUMALOG KWIKPEN 100 UNIT/ML KiwkPen Inject into the skin 3 (three) times daily. AS DIRECTED  . LEVEMIR FLEXTOUCH 100 UNIT/ML Pen Inject 12 units in the am and 12 units at night into skin as directed  . losartan (COZAAR) 100 MG tablet Take 0.5 tablets (50 mg total) by mouth daily.  . magnesium oxide (MAG-OX) 400 MG tablet Take 400 mg by mouth daily.  . metoprolol succinate (TOPROL-XL) 50 MG 24 hr tablet Take 1 tablet (50 mg total) by mouth daily. Take with or immediately following a meal.  . omeprazole (PRILOSEC) 40 MG capsule Take 40 mg by mouth daily.  [name] oxybutynin  (DITROPAN) 5 MG tablet Take 5 mg by mouth 2 (two) times daily.  . potassium chloride (MICRO-K) 10 MEQ CR capsule Take 4 tablets by mouth X's 1 day then go back to 2 tablets by mouth daily  . traMADol (ULTRAM) 50 MG tablet Take 50 mg by mouth every 6 (six) hours as needed for pain.  . Vitamin D, Ergocalciferol, (DRISDOL) 50000 UNITS CAPS Take 5,000 Units by mouth 2 (two) times a week.   . warfarin (COUMADIN) 10 MG tablet Take 10 mg by mouth daily. Takes 7.5 mg along with a 10 mg tablet DAILY,, TEUSDAY and THURSDAY takes 3.75 mg   No facility-administered encounter medications on file as of 03/22/2017.     Functional Status:  In your present state of health, do you have any difficulty performing the following activities: 03/22/2017 11/29/2016  Hearing? - N  Vision? - N  Difficulty concentrating or making decisions? - N  Walking or climbing stairs? - Y  Dressing or bathing? (No Data) Y  Doing errands, shopping? - Y  Conservation officer, nature and eating ? - N  Using the Toilet? - N  In the past six months, have you accidently leaked urine? - Y  Do you have problems with loss of bowel control? - N  Managing your Medications? - N  Managing your Finances? - N  Housekeeping or managing your Housekeeping? - Y  Some recent data might be hidden    Fall/Depression Screening: Fall Risk  02/15/2017 11/29/2016  Falls in the past year? No No  Risk for fall due to : - History of fall(s);Impaired balance/gait;Medication side effect   PHQ 2/9 Scores 11/29/2016 11/22/2016  PHQ - 2 Score 0 0    Assessment: Patient needs reinforcement regarding dietary guidelines for low salt and low carbohydrates.  Plan: Patient will ask for assistance from her family to schedule a dental appointment          Patient will attempt to decrease salty processed foods and sweets.          RN will follow-up in June.  Candie Mile, RN, MSN [place] [phone] Fax [phone]

## 2017-04-06 DIAGNOSIS — E1122 Type 2 diabetes mellitus with diabetic chronic kidney disease: Secondary | ICD-10-CM | POA: Diagnosis not present

## 2017-04-06 DIAGNOSIS — M25561 Pain in right knee: Secondary | ICD-10-CM | POA: Diagnosis not present

## 2017-04-06 DIAGNOSIS — I482 Chronic atrial fibrillation: Secondary | ICD-10-CM | POA: Diagnosis not present

## 2017-04-06 DIAGNOSIS — I1310 Hypertensive heart and chronic kidney disease without heart failure, with stage 1 through stage 4 chronic kidney disease, or unspecified chronic kidney disease: Secondary | ICD-10-CM | POA: Diagnosis not present

## 2017-04-19 ENCOUNTER — Other Ambulatory Visit: Payer: Self-pay

## 2017-04-19 VITALS — Ht 65.0 in | Wt 202.3 lb

## 2017-04-19 DIAGNOSIS — Z794 Long term (current) use of insulin: Secondary | ICD-10-CM

## 2017-04-19 DIAGNOSIS — E118 Type 2 diabetes mellitus with unspecified complications: Secondary | ICD-10-CM

## 2017-04-19 NOTE — Patient Outreach (Signed)
[place] [facility]) Care Management  04/19/2017  DUNYA MEINERS [birth year] [id number]   Monthly assessment with patient.  She reports episode of hypoglycemia this am.  She was able to follow through with guidelines and bring CBG back up to normal.    Patient discussed difficulty of following through with management of DM and CHF.  She does have a telescale that transmits her daily weights to her cardiologist Dr. Irish Lack.  Reviewed with her CHF Action Plan, and guidelines for responding to sudden increases in weight and swollen ankles.  Stressed the importance of medication adherence and self-management guidelines to maintain her goals of avoiding hospitalization and living independently/  Plan:  Patient will continue efforts to limit dietary salt and carbohydrates.            Patient will follow through with plans to discuss advance directives with her daughter.           Patient will call PCP about new Rx for Tramadol.           RN will follow up in July.  Candie Mile, RN, MSN [place] [phone] Fax [phone]

## 2017-04-20 DIAGNOSIS — M1711 Unilateral primary osteoarthritis, right knee: Secondary | ICD-10-CM | POA: Diagnosis not present

## 2017-04-20 DIAGNOSIS — M25561 Pain in right knee: Secondary | ICD-10-CM | POA: Diagnosis not present

## 2017-05-05 DIAGNOSIS — I130 Hypertensive heart and chronic kidney disease with heart failure and stage 1 through stage 4 chronic kidney disease, or unspecified chronic kidney disease: Secondary | ICD-10-CM | POA: Diagnosis not present

## 2017-05-05 DIAGNOSIS — N183 Chronic kidney disease, stage 3 (moderate): Secondary | ICD-10-CM | POA: Diagnosis not present

## 2017-05-05 DIAGNOSIS — I482 Chronic atrial fibrillation: Secondary | ICD-10-CM | POA: Diagnosis not present

## 2017-05-05 DIAGNOSIS — Z0000 Encounter for general adult medical examination without abnormal findings: Secondary | ICD-10-CM | POA: Diagnosis not present

## 2017-05-16 DIAGNOSIS — M11262 Other chondrocalcinosis, left knee: Secondary | ICD-10-CM | POA: Diagnosis not present

## 2017-05-16 DIAGNOSIS — M170 Bilateral primary osteoarthritis of knee: Secondary | ICD-10-CM | POA: Diagnosis not present

## 2017-05-16 DIAGNOSIS — M25462 Effusion, left knee: Secondary | ICD-10-CM | POA: Diagnosis not present

## 2017-05-16 DIAGNOSIS — M25461 Effusion, right knee: Secondary | ICD-10-CM | POA: Diagnosis not present

## 2017-05-17 ENCOUNTER — Encounter: Payer: Self-pay | Admitting: *Deleted

## 2017-05-17 ENCOUNTER — Other Ambulatory Visit: Payer: Self-pay

## 2017-05-17 DIAGNOSIS — Z794 Long term (current) use of insulin: Secondary | ICD-10-CM

## 2017-05-17 DIAGNOSIS — E1110 Type 2 diabetes mellitus with ketoacidosis without coma: Secondary | ICD-10-CM

## 2017-05-17 DIAGNOSIS — E118 Type 2 diabetes mellitus with unspecified complications: Secondary | ICD-10-CM

## 2017-05-17 NOTE — Patient Outreach (Signed)
[place] [facility]) Care Management  [place]  05/17/2017   [name] 05/10/1933 [id number]   Telephonic assessment with patient.  HIPAA identifiers confirmed.  Patient reports she went to [facility] yesterday for assessment of knee pain.  EMR report states osteoarthritis in both knees, worse on right knee. She reports she was given a brace to aid with ambulation.  Patient reports ongoing chronic pain in both knees, aggravated with standing and walking.  States she is taking extra strength tylenol but rates her pain at 9.  Patient reports checking daily cbgs, and reports 2-3 hypoglycemic episodes in past week. Patient is able to discuss proper management of hypoglycemia.  Patient has telescales for monitoring CHF, but states she has unplugged her scales due to difficulty standing on the scales due to her knee pain.  Encouraged her to start weighing daily using the leg brace to help her stand on the scales.  She also reports edema in both feet/ankles.  Patient reports her family is discussing whether she should continue living alone.  She has an aide for 2.5 hours a day, and states that her family comes in often to bring food.  Encounter Medications:  Outpatient Encounter Prescriptions as of 05/17/2017  Medication Sig  . ACCU-CHEK AVIVA PLUS test strip USE AS DIRECTED TO CHECK BLOOD SUGAR QID  . acetaminophen (TYLENOL) 500 MG tablet Take 1,000 mg by mouth every 6 (six) hours as needed.  [name] amiodarone (PACERONE) 200 MG tablet TAKE 1 TABLET BY MOUTH DAILY  . amLODipine (NORVASC) 5 MG tablet TAKE 1 TABLET(5 MG) BY MOUTH DAILY  . atorvastatin (LIPITOR) 20 MG tablet Take 20 mg by mouth daily.  . BD PEN NEEDLE NANO U/F 32G X 4 MM MISC U UTD QID FOR 30 DAYS  . calcium carbonate (OS-CAL) 600 MG TABS Take 600 mg by mouth 2 (two) times daily with a meal.  . Colesevelam HCl 3.75 G PACK Take 3.75 g by mouth daily.  . ferrous gluconate (FERGON) 324 MG tablet Take 325 mg by mouth  daily with breakfast.   . furosemide (LASIX) 80 MG tablet TAKE 1 TABLET BY MOUTH TWICE DAILY  . gabapentin (NEURONTIN) 100 MG capsule   . HUMALOG KWIKPEN 100 UNIT/ML KiwkPen Inject into the skin 3 (three) times daily. AS DIRECTED  . LEVEMIR FLEXTOUCH 100 UNIT/ML Pen Inject 12 units in the am and 12 units at night into skin as directed  . losartan (COZAAR) 100 MG tablet Take 0.5 tablets (50 mg total) by mouth daily.  . magnesium oxide (MAG-OX) 400 MG tablet Take 400 mg by mouth daily.  . metoprolol succinate (TOPROL-XL) 50 MG 24 hr tablet Take 1 tablet (50 mg total) by mouth daily. Take with or immediately following a meal.  . omeprazole (PRILOSEC) 40 MG capsule Take 40 mg by mouth daily.  [name] oxybutynin (DITROPAN) 5 MG tablet Take 5 mg by mouth 2 (two) times daily.  . potassium chloride (MICRO-K) 10 MEQ CR capsule Take 4 tablets by mouth X's 1 day then go back to 2 tablets by mouth daily  . traMADol (ULTRAM) 50 MG tablet Take 50 mg by mouth every 6 (six) hours as needed for pain.  . Vitamin D, Ergocalciferol, (DRISDOL) 50000 UNITS CAPS Take 5,000 Units by mouth 2 (two) times a week.   . warfarin (COUMADIN) 10 MG tablet Take 10 mg by mouth daily. Takes 7.5 mg along with a 10 mg tablet DAILY,, TEUSDAY and THURSDAY takes 3.75 mg  No facility-administered encounter medications on file as of 05/17/2017.     Functional Status:  In your present state of health, do you have any difficulty performing the following activities: 05/17/2017 03/22/2017  Hearing? - -  Vision? N -  Difficulty concentrating or making decisions? N -  Walking or climbing stairs? Y -  Dressing or bathing? Y (No Data)  Doing errands, shopping? Y -  Conservation officer, nature and eating ? Y -  Using the Toilet? N -  In the past six months, have you accidently leaked urine? Y -  Do you have problems with loss of bowel control? N -  Managing your Medications? N -  Managing your Finances? N -  Housekeeping or managing your Housekeeping? Y -   Some recent data might be hidden    Fall/Depression Screening: Fall Risk  05/17/2017 04/19/2017 02/15/2017  Falls in the past year? No No No  Risk for fall due to : History of fall(s);Impaired balance/gait;Medication side effect - -   PHQ 2/9 Scores 11/29/2016 11/22/2016  PHQ - 2 Score 0 0   THN CM Care Plan Problem One     Most Recent Value  Care Plan Problem One  Chronic disease self management needs related to Diabetes  Role Documenting the Problem One  [facility] [place] for Problem One  Active  THN Long Term Goal   Patient will avoid hospitalization over the next 90 days.  THN Long Term Goal Start Date  11/29/16  THN Long Term Goal Met Date  03/22/17  Interventions for Problem One Long Term Goal  Reviewed diabetic self-care management including diet, monitoring cbgs, taking meds as ordered (including SSI).  THN CM Short Term Goal #1   Patient will record daily cbgs and SSI dosages.  THN CM Short Term Goal #1 Start Date  11/29/16  [facility] CM Short Term Goal #1 Met Date  02/15/17  Interventions for Short Term Goal #1  Pt. reports 2-3 hypoglycemic episodes/week.  Reinforced management of same.    [facility] CM Care Plan Problem Two     Most Recent Value  Care Plan Problem Two  Patient states she needs a dental appointment.  Role Documenting the Problem Two  [place] for Problem Two  Active  THN CM Short Term Goal #1   Patient will contact [place] and inquire about network dentists and schedule an appointment within 30 days.  THN CM Short Term Goal #1 Start Date  11/29/16  Interventions for Short Term Goal #2   Encouraged patient to request assistance of family in setting up appt.    THN CM Care Plan Problem Three     Most Recent Value  Care Plan Problem Three  Patient has not completed advanced directives.  Role Documenting the Problem [place] for Problem Three  Active  THN CM Short Term Goal #1   Patient will complete advanced directives within 30  days.  THN CM Short Term Goal #1 Start Date  11/29/16  Interventions for Short Term Goal #1  Encouraged her to discuss with daughter and get forms completed.     Plan: Patient will resume daily weights using her telescales for CHF monitoring.           Patient will continue checking and recording cbgs.           RN will refer to [place] for assessment/assistance with need for move to assisted living.  RN will communicate with PCP re pain management.           RN will follow up in August.  Candie Mile, RN, MSN [place] [phone] Fax [phone]

## 2017-05-19 ENCOUNTER — Other Ambulatory Visit: Payer: Self-pay | Admitting: *Deleted

## 2017-05-19 ENCOUNTER — Encounter: Payer: Self-pay | Admitting: *Deleted

## 2017-05-19 NOTE — Patient Outreach (Signed)
[place] [facility]) Care Management  05/19/2017  [name] 02-Sep-1933 [id number]   CSW was able to make initial contact with patient today to perform phone assessment, as well as assess and assist with social work needs and services.  CSW introduced self, explained role and types of services provided through [place] [facility] ([place] [facility]).  CSW further explained to patient that CSW works with patient's [place], also with [place] [facility], Candie Mile. CSW then explained the reason for the call, indicating that [name] thought that patient would benefit from social work services and resources to assist with completion of Advanced Directives ([place] documents), as well as with placement into a long-term care assisted living facility.  CSW obtained two HIPAA compliant identifiers from patient, which included patient's name and date of birth. Patient reported that she is already receiving in-home aide services, but would really benefit from 24 hour care and supervision.  Patient reported, "My doctor doesn't feel that I am safe to live alone". CSW voiced understanding, agreeing to assist patient with long-term care placement into an assisted living facility.  CSW will contact patient's Primary Care Physician, Dr. [name] to request a completed and signed [place]-2 Form on patient.  In addition, CSW will ensure that patient has a TB Skin Test administered during her next scheduled appointment with [name], both necessary for placement purposes.  In the meantime, CSW agreed to mail patient a complete list of all assisted living facilities in [facility], for her review.  In addition, CSW will mail patient an Advanced Directives packet.  Last, CSW will mail patient a [employer] copy, along with a Consent for Treatment form. CSW agreed to follow-up with patient in one  week to ensure that she received the packet of resource information mailed to her home.  In addition, CSW will review the contents of the packet with patient over the phone, agreeing to schedule a home visit with patient if she needs assistance with completion of Advanced Directives.  CSW explained to patient that CSW attempted to contact patient's daughter, [name] at both numbers provided; however, was unsuccessful.  [name] work number was called but CSW was told that no one worked at that office by that name.  CSW then tried calling [name]'s cell phone number but an automated recording came on stating that the number was not a working number.  Patient agreed to provide [name] with CSW's contact information. [name], BSW, MSW, LCSW  Licensed Education officer, environmental Health System  Mailing [place] [address] [place], [place] [address] Physical Address-[address], [place], [place] [address] Toll Free Main # [phone] Fax # [phone] Cell # [phone]  Office # [phone] [name]

## 2017-05-19 NOTE — Patient Outreach (Signed)
Request received from Joanna Saporito, LCSW to mail patient personal care resources.  Information mailed today. 

## 2017-05-22 ENCOUNTER — Telehealth: Payer: Self-pay | Admitting: Interventional Cardiology

## 2017-05-22 NOTE — Telephone Encounter (Signed)
Patient's daughter calling and state that her mother is having pain in her right leg and having slurred speech. She states that she is taking her to the ER to be evaluated and wanted to make Dr. Irish Lack aware.

## 2017-05-22 NOTE — Telephone Encounter (Signed)
Patient daughter (bernedette smalls) calling, states that patient is complaining of right leg and has slurred speech. Patient fears she has blood clots.

## 2017-05-23 ENCOUNTER — Other Ambulatory Visit: Payer: Self-pay | Admitting: Nurse Practitioner

## 2017-05-23 DIAGNOSIS — F8081 Childhood onset fluency disorder: Secondary | ICD-10-CM

## 2017-05-26 ENCOUNTER — Other Ambulatory Visit: Payer: Self-pay | Admitting: *Deleted

## 2017-05-26 NOTE — Patient Outreach (Signed)
[facility]) Care Management  05/26/2017  DYMOND GUTT [birth year] [id number]   CSW made an attempt to try and contact patient today to follow-up regarding social work services and resources, as well as to ensure that patient received the packet of resource information mailed to her home by CSW; however, patient was unavailable.  A HIPAA compliant message was left for patient on voicemail and CSW is currently awaiting a return call.  CSW will make a second outreach attempt within the next week, if CSW does not receive a return call from patient in the meantime. [name], BSW, MSW, LCSW  Licensed Education officer, environmental Health System  Mailing [place] [address], [place], [place] [address] Physical Address-[address], [place], [place] [address] Toll Free Main # [phone] Fax # [phone] Cell # [phone]  Office # [phone] [email or web address]

## 2017-05-31 ENCOUNTER — Other Ambulatory Visit: Payer: Self-pay

## 2017-05-31 VITALS — Wt 201.2 lb

## 2017-05-31 DIAGNOSIS — Z794 Long term (current) use of insulin: Secondary | ICD-10-CM

## 2017-05-31 DIAGNOSIS — E118 Type 2 diabetes mellitus with unspecified complications: Secondary | ICD-10-CM

## 2017-05-31 NOTE — Patient Outreach (Signed)
[facility]) Care Management  05/31/2017  [name] 1933-02-25 [id number]  Telephonic assessment with patient.  HIPAA identifiers confirmed.   Patient reports she is using a brace on her knee or a wheelchair to get around.  She reports no knee pain this morning.  Patient reports she developed some slurred speech recently, and that her daughter contacted the doctor.  She has a neurology consult scheduled for September 17th.  Patient's speech does not appear slurred this morning.  Patient stated she will go to ED for s/s stroke.  Patient states her daughter is checking into assisted living placement, and that she has received information sent by the [facility].  Plan:  Patient will continue self-management of her diabetes and CHF.           Patient will keep medical appointments.           Patient will go to ED for s/s of stroke.           RN will follow up in September.  Candie Mile, RN, MSN [place] [phone] Fax [phone]

## 2017-06-01 ENCOUNTER — Other Ambulatory Visit: Payer: Self-pay | Admitting: *Deleted

## 2017-06-01 NOTE — Patient Outreach (Signed)
[name]) Care Management  06/01/2017  [name] April 18, 1933 [id number]   CSW was able to make brief contact with patient today to follow-up regarding social work services and resources, as well as to answer any questions she may have regarding the packet of resource information CSW mailed to her home.  Patient admitted that she was able to review the contents of the packet, but then gave it to her daughter, [name] for her review.  Patient went on to say that [name] is in the process of touring various facilities, thinking it would be best for CSW to contact [name] directly to discuss the placement process. CSW was able to make contact with patient's daughter, [name] today to follow-up regarding long-term care assisted living placement arrangements for patient.  [name] reports that she is leaning toward placing patient at [facility], having already toured the facility and talked with staff about services provided and the business office about finances.  [name] is aware that she will need to change patient's Partial Adult Medicaid status to Long-Term Care Medicaid status, once patient is actually placed in a facility.  CSW explained the process for doing so, making [name] aware that she will need to complete the application process in person at the [facility]. [name] admitted that her goal is to get herself and patient moved to [place] within the next 6-8 months, but feeling the need to go ahead and place patient in a 24 hour care facility.  [name] is aware that she will need to have patient's Medicaid transferred to [place], as well.  [name] denied wanting to complete patient's Advanced Directives (Living Will and [name] documents) at this time.  [name] reported that she would prefer to have patient complete this paperwork once in [place].  CSW  inquired as to how CSW could be of further assistance to patient and/or [name] at this time.  [name] denied being able to identify any additional social work needs at present.  CSW was able to ensure that [name] has the correct contact information for CSW, encouraging [name] to contact CSW directly if additional needs arise in the near future.  [name] voiced understanding and was agreeable to this plan. CSW will perform a case closure on patient, as all goals of treatment have been met from social work standpoint and no additional social work needs have been identified at this time.  CSW will notify patient's RNCM with [facility] [facility], Raina Mina of CSW's plans to close patient's case.  CSW will fax an update to patient's Primary Care Physician, Dr. [name] to ensure that they are aware of CSW's involvement with patient's plan of care.  CSW will submit a case closure request to Verlon Setting, Care Management Assistant with [facility], in the form of an [employer].  CSW will ensure that [name] is aware of [name], RNCM with [facility] [facility], continued involvement with patient's care. [name], BSW, MSW, LCSW  Licensed Education officer, environmental Health System  Mailing [place] [address], [place], [place] [address] Physical Address-[address], [place], [place] [address] Toll Free Main # [phone] Fax # [phone] Cell # [phone]  Office # [phone] [email or web address]

## 2017-06-29 ENCOUNTER — Other Ambulatory Visit: Payer: Self-pay

## 2017-06-29 VITALS — Ht 65.0 in | Wt 199.0 lb

## 2017-06-29 DIAGNOSIS — Z794 Long term (current) use of insulin: Secondary | ICD-10-CM

## 2017-06-29 DIAGNOSIS — E118 Type 2 diabetes mellitus with unspecified complications: Secondary | ICD-10-CM

## 2017-06-29 NOTE — Patient Outreach (Signed)
[facility]) Care Management  06/29/2017  [name] [birth year] [id number]   Telephone assessment with patient.  She reports no changes in her diabetes.  She is doing daily cbg checks and daily weights.  She has appt with PCP scheduled for 07-20-17.  Pt. States her daughter is checking out assisted living facilities.  RN will follow up in October.  Candie Mile, RN, MSN [place] [phone] Fax [phone]

## 2017-07-08 DIAGNOSIS — J069 Acute upper respiratory infection, unspecified: Secondary | ICD-10-CM | POA: Diagnosis not present

## 2017-07-10 ENCOUNTER — Ambulatory Visit: Payer: Medicare Other | Admitting: Neurology

## 2017-07-11 ENCOUNTER — Encounter: Payer: Self-pay | Admitting: Neurology

## 2017-07-13 ENCOUNTER — Ambulatory Visit
Admission: RE | Admit: 2017-07-13 | Discharge: 2017-07-13 | Disposition: A | Discharge status: Home / Self Care | Payer: Medicare Other | Source: Ambulatory Visit | Attending: Nurse Practitioner | Admitting: Nurse Practitioner

## 2017-07-13 DIAGNOSIS — R479 Unspecified speech disturbances: Secondary | ICD-10-CM | POA: Diagnosis not present

## 2017-07-13 DIAGNOSIS — F8081 Childhood onset fluency disorder: Secondary | ICD-10-CM

## 2017-07-18 DIAGNOSIS — E1122 Type 2 diabetes mellitus with diabetic chronic kidney disease: Secondary | ICD-10-CM | POA: Diagnosis not present

## 2017-07-18 DIAGNOSIS — Z7901 Long term (current) use of anticoagulants: Secondary | ICD-10-CM | POA: Diagnosis not present

## 2017-07-18 DIAGNOSIS — N183 Chronic kidney disease, stage 3 (moderate): Secondary | ICD-10-CM | POA: Diagnosis not present

## 2017-07-18 DIAGNOSIS — I1310 Hypertensive heart and chronic kidney disease without heart failure, with stage 1 through stage 4 chronic kidney disease, or unspecified chronic kidney disease: Secondary | ICD-10-CM | POA: Diagnosis not present

## 2017-07-18 DIAGNOSIS — N08 Glomerular disorders in diseases classified elsewhere: Secondary | ICD-10-CM | POA: Diagnosis not present

## 2017-07-27 DIAGNOSIS — Z7901 Long term (current) use of anticoagulants: Secondary | ICD-10-CM | POA: Diagnosis not present

## 2017-07-28 ENCOUNTER — Other Ambulatory Visit: Payer: Self-pay

## 2017-07-28 VITALS — Wt 198.8 lb

## 2017-07-28 DIAGNOSIS — E1110 Type 2 diabetes mellitus with ketoacidosis without coma: Secondary | ICD-10-CM

## 2017-07-28 DIAGNOSIS — Z794 Long term (current) use of insulin: Secondary | ICD-10-CM

## 2017-07-28 DIAGNOSIS — E118 Type 2 diabetes mellitus with unspecified complications: Secondary | ICD-10-CM

## 2017-07-28 NOTE — Patient Outreach (Addendum)
[facility]) Care Management  07/28/2017  [name] Mar 09, 1933 [id number]   Monthly telephonic assessment completed with patient.  She kept her PCP appointment on 07-20-17, and states she has a neurology consult scheduled for 08-10-17 due to dysphasia.  Patient reports she did not get her flu shot at PCP visit because she had a cold.    Patient reports cbgs have been up and down.  This morning she reports cbg of 234, which she attributes to eating sweets last evening.  Patient states she does not always know what to eat.  Patient states that her daughter did look at several assisted living facilities, but that they were too expensive.  Patient did not seem to know that her Medicaid may cover some assisted living expenses.  She currently has aide services 2 hours a day for 5 days a week.  Plan:  Patient will get her flu shot as soon as her cold resolves.           Patient will keep neurology consult appointment on 08-10-17.           Patient will avoid sweets and other carbs that raise her blood sugar.           RN will mail educational materials on diabetic dietary guidelines.           RN will notify SW re questions about payment for assisted living.           RN will follow up in November.  Candie Mile, RN, MSN [place] [phone] Fax [phone]

## 2017-07-30 ENCOUNTER — Encounter: Payer: Self-pay | Admitting: *Deleted

## 2017-08-07 ENCOUNTER — Other Ambulatory Visit: Payer: Self-pay | Admitting: *Deleted

## 2017-08-07 NOTE — Patient Outreach (Signed)
[facility]) Care Management  08/07/2017  [name] Sep 06, 1933 [id number]   CSW made an initial attempt to try and contact patient and patient's daughter, [name] today to perform the phone assessment, as well as assess and assist with social needs and services, without success.  A HIPAA compliant message was left for patient and [name] on voicemail.  CSW is currently awaiting a return call. CSW will make a second outreach attempt within the next week, if CSW does not receive a return call from patient in the meantime. [name], BSW, MSW, LCSW  Licensed Education officer, [facility]  Mailing [place] [address] [place], [place] [address] Physical Address-[address], [place], [place] [address] Toll Free Main # [phone] Fax # [phone] Cell # [phone]  Office # [phone] [email or web address]

## 2017-08-10 ENCOUNTER — Ambulatory Visit: Payer: Medicare Other | Admitting: Neurology

## 2017-08-10 ENCOUNTER — Encounter: Payer: Self-pay | Admitting: *Deleted

## 2017-08-10 ENCOUNTER — Other Ambulatory Visit: Payer: Self-pay | Admitting: *Deleted

## 2017-08-10 NOTE — Patient Outreach (Signed)
[facility]) Care Management  08/10/2017  [name] May 30, 1933 [id number]   CSW was able to make initial contact with [name] today to perform phone assessment, as well as assess and assist with social work needs and services.  CSW introduced self, explained role and types of services provided through [facility] [facility] ([facility]).  CSW further explained to [name] that CSW works with [name]'s RNCM, also with [facility], [name]. CSW then explained the reason for the call, indicating that [name] thought that [name] would benefit from social work services and resources to assist with explaining the process for applying for Long-Term Care Medicaid.  CSW obtained two HIPAA compliant identifiers from [name], which included [name]'s name and date of birth. [name] admitted that she and her daughter, [name] are currently in the process of looking into long-term care placement in an assisted living facility for [name].  [name] requested that CSW explain the process of applying for Long-Term Care Medicaid to her, as she agreed to report findings to [name].  CSW explained the entire process, informing [name] that she would need to go to the [place] to apply for [employer] in person.  This is not an application that can be filled out on-line, or completed and then submitted.  CSW further explained that it can take 45-60 days to process the application and the representative with DSS typically likes to have a list of assisted living facilities of choice, to ensure that these facilities are Medicaid approved. CSW agreed to assist with the placement process, once [name] and [name] are to that point.  CSW explained that CSW can request a completed and signed FL-2 Form from [name]'s Primary Care Physician, [name], then fax to all facilities of choice.  Once bed  offers are received, CSW would review these offers with [name] and [name], encouraging them to go and view facilities of interest.  [name] voiced understanding and was agreeable to this plan; however, [name] reported that it would probably be several months before she and [name] pursue placement arrangements.  CSW was able to ensure that [name] has the correct contact information for CSW, encouraging [name] to contact CSW directly if additional social work needs arise in the near future, or if [name] needs further assistance with placement.  CSW will perform a case closure on [name], as all goals of treatment have been met from social work standpoint and no additional social work needs have been identified at this time.  CSW will notify [name]'s RNCM with [facility], [name] of CSW's plans to close [name]'s case.  CSW will fax an update to [name]'s Primary Care Physician, [name] to ensure that they are aware of CSW's involvement with [name]'s plan of care.  CSW will submit a case closure request to [name], Care Management Assistant with [facility], in the form of an [employer].  CSW will ensure that [name] is aware of [name], RNCM with [facility], continued involvement with [name]'s care.

## 2017-08-14 ENCOUNTER — Ambulatory Visit: Payer: Self-pay | Admitting: *Deleted

## 2017-08-22 DIAGNOSIS — Z7901 Long term (current) use of anticoagulants: Secondary | ICD-10-CM | POA: Diagnosis not present

## 2017-08-25 ENCOUNTER — Ambulatory Visit: Payer: Self-pay | Admitting: *Deleted

## 2017-08-28 ENCOUNTER — Other Ambulatory Visit: Payer: Self-pay | Admitting: *Deleted

## 2017-08-28 NOTE — Patient Outreach (Signed)
[facility]) Care Management  08/28/2017  [name] October 16, 1933 [id number]  RN Health Coach Monthly Outreach  Referral Date: 11/22/2016 Referral Source: [facility] Screening Reason for Referral: Disease Management/Education Insurance: [employer]   Outreach Attempt: Outreach attempt #1 to patient for telephone monthly outreach. No answer. RN Health Coach left HIPAA compliant voicemail message along with contact information.   Plan: RN Health Coach to make another outreach attempt in the month of November.   [place] [phone] [email or web address]

## 2017-08-29 ENCOUNTER — Ambulatory Visit: Payer: Medicare Other | Admitting: Neurology

## 2017-08-29 ENCOUNTER — Encounter: Payer: Self-pay | Admitting: Neurology

## 2017-08-29 VITALS — BP 149/77 | HR 69 | Wt 201.0 lb

## 2017-08-29 DIAGNOSIS — I639 Cerebral infarction, unspecified: Secondary | ICD-10-CM | POA: Diagnosis not present

## 2017-08-29 DIAGNOSIS — R4701 Aphasia: Secondary | ICD-10-CM

## 2017-08-29 DIAGNOSIS — R29818 Other symptoms and signs involving the nervous system: Secondary | ICD-10-CM

## 2017-08-29 DIAGNOSIS — R1319 Other dysphagia: Secondary | ICD-10-CM | POA: Diagnosis not present

## 2017-08-29 NOTE — Progress Notes (Signed)
[facility]    Provider:  Dr Jaynee Eagles Referring Provider: [name], MD Primary Care Physician:  [name], MD  CC:  stuttering   HPI:  [name] is a 83 y.o. female here as a referral from [name] for stuttering and speech slurring. PMHx SVT, obesity, LBBB, HTN, DVT, DM, clotting disorder, CKD, CHF.  She has a history of strokes. She is having expressive aphasia, dysphagia, not improving overall. Here with daughter who provides much information. She has to think about it, knows what she wants to say but can't get it out. No new weakness, numbness, facial droop, vision changes, new difficulty walking. It was acute onset, she woke up one morning and couldn't talk, couldn't swallow. Since then symptoms. She takes her coumadin. When she tests her coumadin it is always between 2-2.5. She checks it once a week but did not have the machine 5 months ago. The food wont go all the way down, coughing with food, no drooling. No recent falls, no trauma, no inciting events, last fall was in 2016. No memory loss, daughter here and also agrees.   Reviewed notes, labs and imaging from outside physicians, which showed:  CT head personally reviews images and agree with the following:  Brain: No evidence of acute infarction, hemorrhage, hydrocephalus, extra-axial collection or mass lesion/mass effect.  Vascular: No hyperdense vessel or unexpected calcification.  Skull: Normal. Negative for fracture or focal lesion.  Sinuses/Orbits: No acute finding.  Other: None.  IMPRESSION: No acute abnormality noted.  Review of Systems: Patient complains of symptoms per HPI as well as the following symptoms: difficulty speaking and swallowing, joint pain. Pertinent negatives and positives per HPI. All others negative.   Social History   Socioeconomic History  . Marital status: Widowed    Spouse name: Not on file  . Number of children: Not on file  . Years of education:  Not on file  . Highest education level: Not on file  Social Needs  . Financial resource strain: Not on file  . Food insecurity - worry: Not on file  . Food insecurity - inability: Not on file  . Transportation needs - medical: No  . Transportation needs - non-medical: No  Occupational History  . Not on file  Tobacco Use  . Smoking status: Never Smoker  . Smokeless tobacco: Never Used  Substance and Sexual Activity  . Alcohol use: No  . Drug use: No  . Sexual activity: Not on file  Other Topics Concern  . Not on file  Social History Narrative   Lives at home alone   She has a nursing aid who comes to her home for 2.5 hrs/day x 4 days/week    Right handed   1 cup caffeine daily    Family History  Problem Relation Age of Onset  . Stroke Mother   . Cancer Father   . Hypertension Daughter   . Heart attack Brother        x2  . Hypertension Brother   . Stroke Sister   . Hypertension Sister     Past Medical History:  Diagnosis Date  . Anemia   . Arthritis   . Cataract   . Chronic diastolic CHF (congestive heart failure) ([place])   . CKD (chronic kidney disease), stage III ([place])   . Clotting disorder ([place])   . Diabetes mellitus ([place])   . Dizziness and giddiness   . DVT (deep venous thrombosis) ([place])    a. On Coumadin  for this.  . Essential hypertension   . LBBB (left bundle branch block)   . Mitral regurgitation    a. Mod MR by echo 2014.  . Muscular deconditioning   . Obesity   . SVT (supraventricular tachycardia) ([place])    a. In 2013 she had an EPS with ablation for SVT which did not eliminate the SVT completely. She also had bradycardia which limited medication. She was placed on amiodarone by Dr. Lovena Le.  . Thrombocytopenia ([place]) 11/21/2011    Past Surgical History:  Procedure Laterality Date  . EYE SURGERY      Current Outpatient Medications  Medication Sig Dispense Refill  . ACCU-CHEK AVIVA PLUS test strip USE AS DIRECTED TO CHECK BLOOD SUGAR QID  11  .  acetaminophen (TYLENOL) 500 MG tablet Take 1,000 mg by mouth every 6 (six) hours as needed.    [name] amiodarone (PACERONE) 200 MG tablet TAKE 1 TABLET BY MOUTH DAILY 90 tablet 3  . amLODipine (NORVASC) 5 MG tablet TAKE 1 TABLET(5 MG) BY MOUTH DAILY 30 tablet 10  . atorvastatin (LIPITOR) 20 MG tablet Take 20 mg by mouth daily.    . BD PEN NEEDLE NANO U/F 32G X 4 MM MISC U UTD QID FOR 30 DAYS  0  . calcium carbonate (OS-CAL) 600 MG TABS Take 600 mg by mouth 2 (two) times daily with a meal.    . Colesevelam HCl 3.75 G PACK Take 3.75 g by mouth daily.    . ferrous gluconate (FERGON) 324 MG tablet Take 325 mg by mouth daily with breakfast.     . furosemide (LASIX) 80 MG tablet TAKE 1 TABLET BY MOUTH TWICE DAILY 60 tablet 8  . gabapentin (NEURONTIN) 100 MG capsule     . HUMALOG KWIKPEN 100 UNIT/ML KiwkPen Inject into the skin 3 (three) times daily. AS DIRECTED  3  . LEVEMIR FLEXTOUCH 100 UNIT/ML Pen Inject 12 units in the am and 12 units at night into skin as directed  4  . losartan (COZAAR) 100 MG tablet Take 0.5 tablets (50 mg total) by mouth daily. 45 tablet 2  . magnesium oxide (MAG-OX) 400 MG tablet Take 400 mg by mouth daily.    . metoprolol succinate (TOPROL-XL) 50 MG 24 hr tablet Take 1 tablet (50 mg total) by mouth daily. Take with or immediately following a meal. 90 tablet 3  . omeprazole (PRILOSEC) 40 MG capsule Take 40 mg by mouth daily.    [name] oxybutynin (DITROPAN) 5 MG tablet Take 5 mg by mouth 2 (two) times daily.    . potassium chloride (MICRO-K) 10 MEQ CR capsule Take 4 tablets by mouth X's 1 day then go back to 2 tablets by mouth daily 180 capsule 1  . traMADol (ULTRAM) 50 MG tablet Take 50 mg by mouth every 6 (six) hours as needed for pain.    . Vitamin D, Ergocalciferol, (DRISDOL) 50000 UNITS CAPS Take 5,000 Units by mouth 2 (two) times a week.     . warfarin (COUMADIN) 10 MG tablet Take 10 mg by mouth daily. Takes 7.5 mg along with a 10 mg tablet DAILY,, TEUSDAY and THURSDAY takes 3.75  mg     No current facility-administered medications for this visit.     Allergies as of 08/29/2017 - Review Complete 08/29/2017  Allergen Reaction Noted  . Aspirin Itching 01/07/2009  . Penicillins Itching and Rash 01/07/2009    Vitals: BP (!) 149/77 (BP Location: Left Arm, Patient Position: Sitting)   Pulse 69  Wt 201 lb (91.2 kg)   BMI 33.45 kg/m  Last Weight:  Wt Readings from Last 1 Encounters:  08/29/17 201 lb (91.2 kg)   Last Height:   Ht Readings from Last 1 Encounters:  06/29/17 5\' 5"  (1.651 m)    Physical exam: Exam: Gen: NAD, conversant                  CV: RRR, no MRG. No Carotid Bruits. +peripheral edema, warm, nontender Eyes: Conjunctivae clear without exudates or hemorrhage  Neuro: Detailed Neurologic Exam  Speech:    Speech is spontaneous with normal comprehension.  Cognition:    The patient is oriented to person, place, and time;     recent and remote memory intact;     language fluent;     normal attention, concentration,     fund of knowledge Cranial Nerves:    The pupils are pinpoint.  Attempted fundoscopic exam coul dnot visualize.  Visual fields are full to finger confrontation. left cataract. Extraocular movements are intact. Trigeminal sensation is intact and the muscles of mastication are normal. The face is symmetric. The palate elevates in the midline. Hearing intact. Voice is normal. Shoulder shrug is normal. The tongue has normal motion without fasciculations.   Coordination:    No dysmetria  Gait:    Hemiplegic with a walker  Motor Observation:    no involuntary movements noted. Tone:    Normal muscle tone.    Posture:    Posture is stooped    Strength: right pronator drift, right mild hemiparesis throughout, left proximal mild weakness deltoid and left hip flexion    Strength is V/V in the upper and lower limbs.      Sensation: intact to LT     Reflex Exam:  DTR's:   Absent lowers,  Deep tendon reflexes in the upper  and lower extremities are symmetrical bilaterally.   Toes:    The toes are equivocal bilaterally.   Clonus:    Clonus is absent.      Assessment/Plan:   83 y.o. female here as a referral from [name]. PMHx SVT, obesity, LBBB, HTN, DVT, DM, clotting disorder, CKD, CHF.  She has a history of strokes per patient. She is having expressive aphasia, dysphagia, not improving overall, acute onset.Need to rule out stroke, brain tumor  Apraxia, unlikely dementia or other degenerative disorders such as parkinson's disease.  MRI brain  MRA head Contact Dr. Irish Lack who is patient's cardiologist as they have an appt later this month and would like to have  echocardiogram and carotid doppler done there Swallow evaluation Speech therapy  Orders Placed This Encounter  Procedures  . MR BRAIN WO CONTRAST  . MR MRA HEAD WO CONTRAST  . DG OP Swallowing Func-Medicare/Speech Path  . Ambulatory referral to Speech Therapy  . SLP modified barium swallow   Cc: Dr. Dereck Leep, [facility] [address] [place],  [address]  Phone [phone] Fax [phone]

## 2017-08-29 NOTE — Patient Instructions (Signed)
MRI brain and blood vessels of the head Dr. Irish Lack is cardiologist - echocardiogram and carotid doppler, I will contact him Swallow evaluation Speech therapy     Ischemic Stroke An ischemic stroke (cerebrovascular accident, or CVA) is the sudden death of brain tissue that occurs when an area of the brain does not get enough oxygen. It is a medical emergency that must be treated right away. An ischemic stroke can cause permanent loss of brain function. This can cause problems with how different parts of your body function. What are the causes? This condition is caused by a decrease of oxygen supply to an area of the brain, which may be the result of:  A small blood clot (embolus) or a buildup of plaque in the blood vessels (atherosclerosis) that blocks blood flow in the brain.  An abnormal heart rhythm (atrial fibrillation).  A blocked or damaged artery in the head or neck.  What increases the risk? Certain factors may make you more likely to develop this condition. Some of these factors are things that you can change, such as:  Obesity.  Smoking cigarettes.  Taking oral birth control, especially if you also use tobacco.  Physical inactivity.  Excessive alcohol use.  Use of illegal drugs, especially cocaine and methamphetamine.  Other risk factors include:  High blood pressure (hypertension).  High cholesterol.  Diabetes mellitus.  Heart disease.  Being [place] American, Native American, Hispanic, or [place] Native.  Being over age 83.  Family history of stroke.  Previous history of blood clots, stroke, or transient ischemic attack (TIA).  Sickle cell disease.  Being a woman with a history of preeclampsia.  Migraine headache.  Sleep apnea.  Irregular heartbeats, such as atrial fibrillation.  Chronic inflammatory diseases, such as rheumatoid arthritis or lupus.  Blood clotting disorders (hypercoagulable state).  What are the signs or  symptoms? Symptoms of this condition usually develop suddenly, or you may notice them after waking up from sleep. Symptoms may include sudden:  Weakness or numbness in your face, arm, or leg, especially on one side of your body.  Trouble walking or difficulty moving your arms or legs.  Loss of balance or coordination.  Confusion.  Slurred speech (dysarthria).  Trouble speaking, understanding speech, or both (aphasia).  Vision changes-such as double vision, blurred vision, or loss of vision-inone or both eyes.  Dizziness.  Nausea and vomiting.  Severe headache with no known cause. The headache is often described as the worst headache ever experienced.  If possible, make note of the exact time that you last felt like your normal self and what time your symptoms started. Tell your health care provider. If symptoms come and go, this could be a sign of a warning stroke, or TIA. Get help right away, even if you feel better. How is this diagnosed? This condition may be diagnosed based on:  Your symptoms, your medical history, and a physical exam.  CT scan of the brain.  MRI.  CT angiogram. This test uses a computer to take X-rays of your arteries. A dye may be injected into your blood to show the inside of your blood vessels more clearly.  MRI angiogram. This is a type of MRI that is used to evaluate the blood vessels.  Cerebral angiogram. This test uses X-rays and a dye to show the blood vessels in the brain and neck.  You may need to see a health care provider who specializes in stroke care. A stroke specialist can be seen in person  or through communication using telephone or television technology (telemedicine). Other tests may also be done to find the cause of the stroke, such as:  Electrocardiogram (ECG).  Continuous heart monitoring.  Echocardiogram.  Carotid ultrasound.  A scan of the brain circulation.  Blood tests.  Sleep study to check for sleep  apnea.  How is this treated? Treatment for this condition will depend on the duration, severity, and cause of your symptoms and on the area of the brain affected. It is very important to get treatment at the first sign of stroke symptoms. Some treatments work better if they are done within 3-6 hours of the onset of stroke symptoms. These initial treatments may include:  Aspirin.  Medicines to control blood pressure.  Medicine given by injection to dissolve the blood clot (thrombolytic).  Treatments given directly to the affected artery to remove or dissolve the blood clot.  Other treatment options may include:  Oxygen.  IV fluids.  Medicines to thin the blood (anticoagulants or antiplatelets).  Procedures to increase blood flow.  Medicines and changes to your diet may be used to help treat and manage risk factors for stroke, such as diabetes, high cholesterol, and high blood pressure. After a stroke, you may work with physical, speech, mental health, or occupational therapists to help you recover. Follow these instructions at home: Medicines  Take over-the-counter and prescription medicines only as told by your health care provider.  If you were told to take a medicine to thin your blood, such as aspirin or an anticoagulant, take it exactly as told by your health care provider. ? Taking too much blood-thinning medicine can cause bleeding. ? If you do not take enough blood-thinning medicine, you will not have the protection that you need against another stroke and other problems.  Understand the side effects of taking anticoagulant medicine. When taking this type of medicine, make sure you: ? Hold pressure over any cuts for longer than usual. ? Tell your dentist and other health care providers that you are taking anticoagulants before you have any procedures that may cause bleeding. ? Avoid activities that may cause trauma or injury. Eating and drinking  Follow instructions  from your health care provider about diet.  Eat healthy foods.  If your ability to swallow was affected by the stroke, you may need to take steps to avoid choking, such as: ? Taking small bites when eating. ? Eating foods that are soft or pureed. Safety  Follow instructions from your health care team about physical activity.  Use a walker or cane as told by your health care provider.  Take steps to create a safe home environment in order to reduce the risk of falls. This may include: ? Having your home looked at by specialists. ? Installing grab bars in the bedroom and bathroom. ? Using safety equipment, such as raised toilets and a seat in the shower. General instructions  Do not use any tobacco products, such as cigarettes, chewing tobacco, and e-cigarettes. If you need help quitting, ask your health care provider.  Limit alcohol intake to no more than 1 drink a day for nonpregnant women and 2 drinks a day for men. One drink equals 12 oz of beer, 5 oz of wine, or 1 oz of hard liquor.  If you need help to stop using drugs or alcohol, ask your health care provider about a referral to a program or specialist.  Maintain an active and healthy lifestyle. Get regular exercise as told by your  health care provider.  Keep all follow-up visits as told by your health care provider, including visits with all specialists on your health care team. This is important. How is this prevented? Your risk of another stroke can be decreased by managing high blood pressure, high cholesterol, diabetes, heart disease, sleep apnea, and obesity. It can also be decreased by quitting smoking, limiting alcohol, and staying physically active. Your health care provider will continue to work with you on measures to prevent short-term and long-term complications of stroke. Get help right away if: You have:  Sudden weakness or numbness in your face, arm, or leg, especially on one side of your body.  Sudden  confusion.  Sudden trouble speaking, understanding, or both (aphasia).  Sudden trouble seeing with one or both eyes.  Sudden trouble walking or difficulty moving your arms or legs.  Sudden dizziness.  Sudden loss of balance or coordination.  Sudden, severe headache with no known cause.  A partial or total loss of consciousness.  A seizure. Any of these symptoms may represent a serious problem that is an emergency. Do not wait to see if the symptoms will go away. Get medical help right away. Call your local emergency services (911 in [place] Do not drive yourself to the hospital. This information is not intended to replace advice given to you by your health care provider. Make sure you discuss any questions you have with your health care provider. Document Released: 10/10/2005 Document Revised: 03/22/2016 Document Reviewed: 01/06/2016 Elsevier Interactive Patient Education  2017 [employer].

## 2017-08-30 ENCOUNTER — Telehealth: Payer: Self-pay | Admitting: Neurology

## 2017-08-30 NOTE — Telephone Encounter (Signed)
Patient's daughter calling to advise the patient has appointment with Dr. Irish Lack on 09-05-17 at 11:40am.

## 2017-08-31 DIAGNOSIS — R1310 Dysphagia, unspecified: Secondary | ICD-10-CM | POA: Insufficient documentation

## 2017-08-31 DIAGNOSIS — R4701 Aphasia: Secondary | ICD-10-CM | POA: Insufficient documentation

## 2017-09-01 ENCOUNTER — Encounter: Payer: Self-pay | Admitting: *Deleted

## 2017-09-01 ENCOUNTER — Other Ambulatory Visit: Payer: Self-pay | Admitting: *Deleted

## 2017-09-01 NOTE — Patient Outreach (Signed)
[facility]) Care Management  [place]  09/01/2017   [name] 1933-05-31 [id number]  RN Health Coach Monthly Outreach   Referral Date: 11/22/2016 Referral Source: [facility] Screening Reason for Referral: Disease Management/Education Insurance: [employer]   Outreach Attempt:  Successful telephone outreach to patient.  HIPAA verified.  Patient stating she was recently told she has had a stroke.  Patient states she has been having issues with stuttering and trouble swallowing.  She also says her right side/arm/leg has been a little weak. Patient states she went to neurologist 08/29/2017 and was told her symptoms was related to stroke.  Per patient she is also experiencing some aphasia/trouble finding her words.  Discussed outpatient therapy and patient states none has been prescribed as of yet, she was waiting to follow up with the physician.  Patient ambulating with walker.  Currently has family staying with her and can assist with care.  States she continues to receive home aide 4 days/week 2 1/2 hours/day.  Feels well supported and wants to start therapy soon to help with her speech.  Reports this morning's fasting blood glucose ws 131.  States her fasting blood sugars have ranged 90-130's.  Denies multiple episodes of hypo or hyperglycemia.  Patient states she and her daughter are still currently looking into assisted living facilities for future plans.  Encounter Medications:  Outpatient Encounter Medications as of 09/01/2017  Medication Sig  . warfarin (COUMADIN) 10 MG tablet Take 10 mg by mouth daily. Takes 7.5 mg along with a 10 mg tablet DAILY,, TEUSDAY and THURSDAY takes 3.75 mg  . ACCU-CHEK AVIVA PLUS test strip USE AS DIRECTED TO CHECK BLOOD SUGAR QID  . acetaminophen (TYLENOL) 500 MG tablet Take 1,000 mg by mouth every 6 (six) hours as needed.  [name] amiodarone (PACERONE) 200 MG tablet TAKE 1 TABLET BY MOUTH DAILY  . amLODipine (NORVASC) 5 MG tablet  TAKE 1 TABLET(5 MG) BY MOUTH DAILY  . atorvastatin (LIPITOR) 20 MG tablet Take 20 mg by mouth daily.  . BD PEN NEEDLE NANO U/F 32G X 4 MM MISC U UTD QID FOR 30 DAYS  . calcium carbonate (OS-CAL) 600 MG TABS Take 600 mg by mouth 2 (two) times daily with a meal.  . Colesevelam HCl 3.75 G PACK Take 3.75 g by mouth daily.  . ferrous gluconate (FERGON) 324 MG tablet Take 325 mg by mouth daily with breakfast.   . furosemide (LASIX) 80 MG tablet TAKE 1 TABLET BY MOUTH TWICE DAILY  . gabapentin (NEURONTIN) 100 MG capsule   . HUMALOG KWIKPEN 100 UNIT/ML KiwkPen Inject into the skin 3 (three) times daily. AS DIRECTED  . LEVEMIR FLEXTOUCH 100 UNIT/ML Pen Inject 12 units in the am and 12 units at night into skin as directed  . losartan (COZAAR) 100 MG tablet Take 0.5 tablets (50 mg total) by mouth daily.  . magnesium oxide (MAG-OX) 400 MG tablet Take 400 mg by mouth daily.  . metoprolol succinate (TOPROL-XL) 50 MG 24 hr tablet Take 1 tablet (50 mg total) by mouth daily. Take with or immediately following a meal.  . omeprazole (PRILOSEC) 40 MG capsule Take 40 mg by mouth daily.  [name] oxybutynin (DITROPAN) 5 MG tablet Take 5 mg by mouth 2 (two) times daily.  . potassium chloride (MICRO-K) 10 MEQ CR capsule Take 4 tablets by mouth X's 1 day then go back to 2 tablets by mouth daily  . traMADol (ULTRAM) 50 MG tablet Take 50 mg by  mouth every 6 (six) hours as needed for pain.  . Vitamin D, Ergocalciferol, (DRISDOL) 50000 UNITS CAPS Take 5,000 Units by mouth 2 (two) times a week.    No facility-administered encounter medications on file as of 09/01/2017.     Functional Status:  In your present state of health, do you have any difficulty performing the following activities: 08/10/2017 06/29/2017  Hearing? N N  Vision? N N  Difficulty concentrating or making decisions? N N  Walking or climbing stairs? Y Y  Dressing or bathing? Y Y  Comment - Has an aide who assists with personal care.  Doing errands, shopping? [place] Donning  Preparing Food and eating ? N Y  Comment - -  Using the Toilet? N N  In the past six months, have you accidently leaked urine? Y Y  Comment - Wears depends.  Do you have problems with loss of bowel control? N N  Managing your Medications? Y N  Managing your Finances? N N  Housekeeping or managing your Housekeeping? Y Y  Some recent data might be hidden    Fall/Depression Screening: Fall Risk  09/01/2017 08/10/2017 06/29/2017  Falls in the past year? No Yes No  Comment - - -  Number falls in past yr: - 1 -  Injury with Fall? - No -  Risk for fall due to : - History of fall(s);Impaired balance/gait;Impaired mobility History of fall(s);Impaired balance/gait;Medication side effect  Follow up - Education provided;Falls prevention discussed -   PHQ 2/9 Scores 09/01/2017 08/10/2017 05/19/2017 11/29/2016 11/22/2016  PHQ - 2 Score 0 0 0 0 0   THN CM Care Plan Problem One     Most Recent Value  Care Plan Problem One  Knowledge deficiet related to self care management of stroke  Role Documenting the Problem One  [place] for Problem One  Active  THN Long Term Goal   Patient will report no hospitalization in the next 90 days  THN Long Term Goal Met Date  09/01/17  Interventions for Problem One Long Term Goal  Reviewed and discussed current care plan with patient,  Patient verbalized understanding, Encouraged patient to make primary and endocrinology follow up appointments, Encouraged patient to discuss with neurologist need for Outpatient speech therapy and outpatient physical therapy  THN CM Short Term Goal #1   Patient will review stroke information sent in the next 30 days  THN CM Short Term Goal #1 Start Date  09/01/17  Interventions for Short Term Goal #1  Discussed with patient the signs and symptoms of stroke, Educated on the importance to call 911 instead of driving to hospital,  encouraged patient to discuss outpatient therapies with providers/physicians,  send EMMI  education on stroke  [facility] CM Short Term Goal #2   Patient will be able to state what the acronym FAST stands for in the next 30 days  THN CM Short Term Goal #2 Start Date  09/01/17  Interventions for Short Term Goal #2  EMMI education on stroke sent,  verbally explained what FAST represented,  utilized teachback to have patient verbalize what the letter stood for and what to do when symptoms aroused     Appointments:  Patient states she saw the neurologist on 08/29/2017.  States she has an appointment with her cardiologist on 09/05/2017.  She believes she is due to see her primary care provider January 2019.  Patient encouraged to speak with her primary care provider sooner to arrange therapies.  Plan:  RN Health Coach will send EMMI education on Stroke. RN Health Coach will send EMMI education on Recovery after Stroke. RN Health Coach will call patient in the month of December to make monthly outreach. RN Health Coach will send primary care provider quarterly update.  [place] [phone] [email or web address]

## 2017-09-04 ENCOUNTER — Other Ambulatory Visit (HOSPITAL_COMMUNITY): Payer: Self-pay | Admitting: Neurology

## 2017-09-04 DIAGNOSIS — R1310 Dysphagia, unspecified: Secondary | ICD-10-CM

## 2017-09-04 NOTE — Progress Notes (Signed)
Cardiology Office Note   Date:  09/05/2017   ID:  [name], DOB 06-04-1933, MRN [id number]  PCP:  [name], MD    No chief complaint on file.  ? stroke  Wt Readings from Last 3 Encounters:  09/05/17 199 lb 3.2 oz (90.4 kg)  08/29/17 201 lb (91.2 kg)  07/28/17 198 lb 12.8 oz (90.2 kg)       History of Present Illness: [name] is a 83 y.o. female   who has had diastolic dysfunction in the past. She is on Coumadin for DVT. She has had atrial arrhythmias and chronic LE edema.   In addition to the history of h/o DVT (on Coumadin for this), chronic diastolic CHF, she also has deconditioning, obesity, HTN, SVT (correlated with pre-syncope, on amiodarone), DM, chronic anemia/thrombocytopenia, LBBB, probable CKD stage III (Cr 1.3 in 08/2014), mod MR by echo 2014 who presents for f/u. Per notes in 2013 she had an EPS with ablation for SVT which did not eliminate the SVT completely. She also had bradycardia which limited medication. She was placed on amiodarone by Dr. Lovena Le. Last echo 07/2013: mild LVH, moderate focal basal hypertrophy of septum, EF 55-60%, grade 1 DD, mod MR, mod LAE, PASP 40.  She sustained a mechanical fall in 07/2015 and was in the hospital for a week.  She was seen in [facility] 2017 for fluid overload.  Lasix was increased and potassium was increased.   There is a concern regarding stroke as she developed some stuttering and swallowing difficulties.  Her workup has been unrevealing at this time.  We are asked to consider a carotid Doppler and echocardiogram.    Past Medical History:  Diagnosis Date  . Anemia   . Arthritis   . Cataract   . Chronic diastolic CHF (congestive heart failure) ([place])   . CKD (chronic kidney disease), stage III ([place])   . Clotting disorder ([place])   . Diabetes mellitus ([place])   . Dizziness and giddiness   . DVT (deep venous thrombosis) ([place])    a. On Coumadin for this.  . Essential hypertension   . LBBB (left bundle  branch block)   . Mitral regurgitation    a. Mod MR by echo 2014.  . Muscular deconditioning   . Obesity   . SVT (supraventricular tachycardia) ([place])    a. In 2013 she had an EPS with ablation for SVT which did not eliminate the SVT completely. She also had bradycardia which limited medication. She was placed on amiodarone by Dr. Lovena Le.  . Thrombocytopenia ([place]) 11/21/2011    Past Surgical History:  Procedure Laterality Date  . EYE SURGERY       Current Outpatient Medications  Medication Sig Dispense Refill  . ACCU-CHEK AVIVA PLUS test strip USE AS DIRECTED TO CHECK BLOOD SUGAR QID  11  . acetaminophen (TYLENOL) 500 MG tablet Take 1,000 mg by mouth every 6 (six) hours as needed.    [name] amiodarone (PACERONE) 200 MG tablet TAKE 1 TABLET BY MOUTH DAILY 90 tablet 3  . atorvastatin (LIPITOR) 20 MG tablet Take 20 mg by mouth daily.    . BD PEN NEEDLE NANO U/F 32G X 4 MM MISC U UTD QID FOR 30 DAYS  0  . calcium carbonate (OS-CAL) 600 MG TABS Take 600 mg by mouth 2 (two) times daily with a meal.    . Colesevelam HCl 3.75 G PACK Take 3.75 g by mouth daily.    . Cyanocobalamin (VITAMIN  B 12 PO) Take 1 tablet daily by mouth.    . ferrous gluconate (FERGON) 324 MG tablet Take 325 mg by mouth daily with breakfast.     . furosemide (LASIX) 80 MG tablet TAKE 1 TABLET BY MOUTH TWICE DAILY 60 tablet 8  . gabapentin (NEURONTIN) 100 MG capsule     . HUMALOG KWIKPEN 100 UNIT/ML KiwkPen Inject into the skin 3 (three) times daily. AS DIRECTED  3  . LEVEMIR FLEXTOUCH 100 UNIT/ML Pen Inject 12 units in the am and 12 units at night into skin as directed  4  . losartan (COZAAR) 100 MG tablet Take 0.5 tablets (50 mg total) by mouth daily. 45 tablet 2  . magnesium oxide (MAG-OX) 400 MG tablet Take 400 mg by mouth daily.    . metoprolol succinate (TOPROL-XL) 50 MG 24 hr tablet Take 1 tablet (50 mg total) by mouth daily. Take with or immediately following a meal. 90 tablet 3  . omeprazole (PRILOSEC) 40 MG capsule  Take 40 mg by mouth daily.    [name] oxybutynin (DITROPAN) 5 MG tablet Take 5 mg by mouth 2 (two) times daily.    . potassium chloride (MICRO-K) 10 MEQ CR capsule Take 4 tablets by mouth X's 1 day then go back to 2 tablets by mouth daily 180 capsule 1  . Vitamin D, Ergocalciferol, (DRISDOL) 50000 UNITS CAPS Take 5,000 Units by mouth 2 (two) times a week.     . warfarin (COUMADIN) 10 MG tablet Take 10 mg by mouth daily. Takes 7.5 mg along with a 10 mg tablet DAILY,, TEUSDAY and THURSDAY takes 3.75 mg     No current facility-administered medications for this visit.     Allergies:   Aspirin and Penicillins    Social History:  The patient  reports that  has never smoked. she has never used smokeless tobacco. She reports that she does not drink alcohol or use drugs.   Family History:  The patient's family history includes Cancer in her father; Heart attack in her brother; Hypertension in her brother, daughter, and sister; Stroke in her mother and sister.    ROS:  Please see the history of present illness.   Otherwise, review of systems are positive for persistent stuttering.   All other systems are reviewed and negative.    PHYSICAL EXAM: VS:  BP 136/64   Pulse (!) 56   Ht 5\' 5"  (1.651 m)   Wt 199 lb 3.2 oz (90.4 kg)   SpO2 99%   BMI 33.15 kg/m  , BMI Body mass index is 33.15 kg/m. GEN: Well nourished, well developed, in no acute distress  HEENT: normal  Neck: no JVD, carotid bruits, or masses Cardiac: RRR; no murmurs, rubs, or gallops; bilateral pitting LE edema  Respiratory:  clear to auscultation bilaterally, normal work of breathing GI: soft, nontender, nondistended, + BS MS: no deformity or atrophy  Skin: warm and dry, no rash Neuro:  Strength and sensation are intact Psych: euthymic mood, full affect   EKG:   The ekg ordered today demonstrates NSR, LBBB   Recent Labs: No results found for requested labs within last 8760 hours.   Lipid Panel    Component Value Date/Time     CHOL (H) 12/11/2008 0130    202        ATP III CLASSIFICATION:  <200     mg/dL   Desirable  200-239  mg/dL   Borderline High  >=240    mg/dL   High  TRIG 64 12/11/2008 0130   HDL 78 12/11/2008 0130   CHOLHDL 2.6 12/11/2008 0130   VLDL 13 12/11/2008 0130   LDLCALC (H) 12/11/2008 0130    111        Total Cholesterol/HDL:CHD Risk Coronary Heart Disease Risk Table                     Men   Women  1/2 Average Risk   3.4   3.3  Average Risk       5.0   4.4  2 X Average Risk   9.6   7.1  3 X Average Risk  23.4   11.0        Use the calculated Patient Ratio above and the CHD Risk Table to determine the patient's CHD Risk.        ATP III CLASSIFICATION (LDL):  <100     mg/dL   Optimal  100-129  mg/dL   Near or Above                    Optimal  130-159  mg/dL   Borderline  160-189  mg/dL   High  >190     mg/dL   Very High     Other studies Reviewed: Additional studies/ records that were reviewed today with results demonstrating:lipids, LDL 103.  .   ASSESSMENT AND PLAN:  1. Chronic diastolic heart failure: Continue Lasix.  Continue to elevate legs. 2. Hypertensive heart disease: Blood pressure well controlled today.  Continue current medications. 3. TIA: We will check carotid Dopplers and echocardiogram to evaluate for source of embolus. 4. Anticoagulated: No bleeding issues.    Current medicines are reviewed at length with the patient today.  The patient concerns regarding her medicines were addressed.  The following changes have been made:  No change  Labs/ tests ordered today include:   Orders Placed This Encounter  Procedures  . EKG 12-Lead  . ECHOCARDIOGRAM COMPLETE    Recommend 150 minutes/week of aerobic exercise Low fat, low carb, high fiber diet recommended  Disposition:   FU in 6 months   Signed, Larae Grooms, MD  09/05/2017 1:14 PM    [facility] [address], [place], [place]  [address] Phone: [phone];  Fax: [phone]

## 2017-09-05 ENCOUNTER — Ambulatory Visit (INDEPENDENT_AMBULATORY_CARE_PROVIDER_SITE_OTHER): Payer: Medicare Other | Admitting: Interventional Cardiology

## 2017-09-05 ENCOUNTER — Encounter: Payer: Self-pay | Admitting: Interventional Cardiology

## 2017-09-05 ENCOUNTER — Encounter (INDEPENDENT_AMBULATORY_CARE_PROVIDER_SITE_OTHER): Payer: Self-pay

## 2017-09-05 VITALS — BP 136/64 | HR 56 | Ht 65.0 in | Wt 199.2 lb

## 2017-09-05 DIAGNOSIS — Z7901 Long term (current) use of anticoagulants: Secondary | ICD-10-CM

## 2017-09-05 DIAGNOSIS — I110 Hypertensive heart disease with heart failure: Secondary | ICD-10-CM

## 2017-09-05 DIAGNOSIS — G459 Transient cerebral ischemic attack, unspecified: Secondary | ICD-10-CM | POA: Diagnosis not present

## 2017-09-05 DIAGNOSIS — I5032 Chronic diastolic (congestive) heart failure: Secondary | ICD-10-CM | POA: Diagnosis not present

## 2017-09-05 NOTE — Patient Instructions (Signed)
Medication Instructions:  Your physician recommends that you continue on your current medications as directed. Please refer to the Current Medication list given to you today.   Labwork: None ordered  Testing/Procedures: Your physician has requested that you have a carotid duplex. This test is an ultrasound of the carotid arteries in your neck. It looks at blood flow through these arteries that supply the brain with blood. Allow one hour for this exam. There are no restrictions or special instructions.  Your physician has requested that you have an echocardiogram. Echocardiography is a painless test that uses sound waves to create images of your heart. It provides your doctor with information about the size and shape of your heart and how well your heart's chambers and valves are working. This procedure takes approximately one hour. There are no restrictions for this procedure.  Follow-Up: Your physician wants you to follow-up in: 6 months with Dr. Irish Lack. You will receive a reminder letter in the mail two months in advance. If you don't receive a letter, please call our office to schedule the follow-up appointment.   Any Other Special Instructions Will Be Listed Below (If Applicable).     If you need a refill on your cardiac medications before your next appointment, please call your pharmacy.

## 2017-09-20 DIAGNOSIS — E1121 Type 2 diabetes mellitus with diabetic nephropathy: Secondary | ICD-10-CM | POA: Diagnosis not present

## 2017-09-20 DIAGNOSIS — Z23 Encounter for immunization: Secondary | ICD-10-CM | POA: Diagnosis not present

## 2017-09-20 DIAGNOSIS — E1165 Type 2 diabetes mellitus with hyperglycemia: Secondary | ICD-10-CM | POA: Diagnosis not present

## 2017-09-20 DIAGNOSIS — E7800 Pure hypercholesterolemia, unspecified: Secondary | ICD-10-CM | POA: Diagnosis not present

## 2017-09-20 DIAGNOSIS — I10 Essential (primary) hypertension: Secondary | ICD-10-CM | POA: Diagnosis not present

## 2017-09-20 DIAGNOSIS — N189 Chronic kidney disease, unspecified: Secondary | ICD-10-CM | POA: Diagnosis not present

## 2017-09-21 DIAGNOSIS — Z7901 Long term (current) use of anticoagulants: Secondary | ICD-10-CM | POA: Diagnosis not present

## 2017-09-26 ENCOUNTER — Ambulatory Visit (HOSPITAL_COMMUNITY)
Admission: RE | Admit: 2017-09-26 | Discharge: 2017-09-26 | Disposition: A | Discharge status: Home / Self Care | Payer: Medicare Other | Source: Ambulatory Visit | Attending: Neurology | Admitting: Neurology

## 2017-09-26 DIAGNOSIS — R1310 Dysphagia, unspecified: Secondary | ICD-10-CM

## 2017-09-26 DIAGNOSIS — R4701 Aphasia: Secondary | ICD-10-CM

## 2017-09-26 DIAGNOSIS — R29818 Other symptoms and signs involving the nervous system: Secondary | ICD-10-CM

## 2017-09-26 DIAGNOSIS — R1319 Other dysphagia: Secondary | ICD-10-CM

## 2017-09-26 DIAGNOSIS — I639 Cerebral infarction, unspecified: Secondary | ICD-10-CM | POA: Diagnosis not present

## 2017-09-26 NOTE — Progress Notes (Signed)
Modified Barium Swallow Progress Note  Patient Details  Name: [name] MRN: [id number] Date of Birth: Jan 30, 1933  Today's Date: 09/26/2017  Modified Barium Swallow completed.  Full report located under Chart Review in the Imaging Section.  Brief recommendations include the following:  Clinical Impression  Pt has a mild, primarily oral dysphagia although with secondary pharyngeal deficits. She has prolonged mastication and incomplete oral clearance with solids, which she clears spontaneously with a second swallow. She required a pureed bolus to clear the barium tablet from her tongue. Pt has premature spillage intermittently with thin liquids, causing trace, transient penetration that clears from the laryngela vestibule with cup sips. Sensed aspiration occurs when she takes larger straw sips, and when she tries to coordinate the thin liquid with a pill. Although she spontaneously coughs, this does not clear her airway. Recommend continuing with regular textures as tolerated and thin liquids by cup, with use of small, single sips and general aspiration precautions.   Swallow Evaluation Recommendations       SLP Diet Recommendations: Regular solids;Thin liquid   Liquid Administration via: Cup;No straw   Medication Administration: Whole meds with puree   Supervision: Patient able to self feed;Intermittent supervision to cue for compensatory strategies   Compensations: Slow rate;Small sips/bites   Postural Changes: Seated upright at 90 degrees   Oral Care Recommendations: Oral care BID        Germain Osgood 09/26/2017,12:37 PM   Germain Osgood, M.A. CCC-SLP [phone]

## 2017-09-29 ENCOUNTER — Ambulatory Visit (HOSPITAL_COMMUNITY)
Admission: RE | Admit: 2017-09-29 | Discharge: 2017-09-29 | Disposition: A | Discharge status: Home / Self Care | Payer: Medicare Other | Source: Ambulatory Visit | Attending: Cardiovascular Disease | Admitting: Cardiovascular Disease

## 2017-09-29 DIAGNOSIS — G459 Transient cerebral ischemic attack, unspecified: Secondary | ICD-10-CM | POA: Diagnosis not present

## 2017-09-29 DIAGNOSIS — I6523 Occlusion and stenosis of bilateral carotid arteries: Secondary | ICD-10-CM | POA: Diagnosis not present

## 2017-10-02 ENCOUNTER — Other Ambulatory Visit: Payer: Self-pay | Admitting: Interventional Cardiology

## 2017-10-03 MED ORDER — AMIODARONE HCL 200 MG PO TABS: 200.0000 mg | ORAL_TABLET | Freq: Every day | ORAL | 3 refills | Status: DC

## 2017-10-03 NOTE — Addendum Note (Signed)
Addended by: Merlene Laughter on: 10/03/2017 11:27 AM   Modules accepted: Orders

## 2017-10-06 ENCOUNTER — Other Ambulatory Visit: Payer: Self-pay | Admitting: *Deleted

## 2017-10-06 ENCOUNTER — Encounter: Payer: Self-pay | Admitting: *Deleted

## 2017-10-06 ENCOUNTER — Other Ambulatory Visit (HOSPITAL_COMMUNITY): Payer: Medicare Other

## 2017-10-06 NOTE — Patient Outreach (Signed)
[place] [facility]) Care Management  10/06/2017  [name] 03/09/1933 [id number]  RN Health Coach Monthly Outreach  Referral Date:11/22/2016 Referral Source:THN Screening Reason for Referral:Disease Management/Education Insurance:[employer] Medicare  Outreach Attempt:  Successful telephone outreach to patient.  HIPAA confirmed.  Patient stating her blood sugars have been elevated the last few weeks.  CBG this morning was 164 with ranges mainly 200-300's.  Denies any major change in her diet and feels the elevated blood sugars may be related to stress.  Has seen her Endocrinologist recently and had her insulin doses and sliding scale adjusted.  Patient states she is in the process of monitoring and recording her CBG readings to send to Dr. Chalmers Cater (Endocrinologist) weeks at a time.  Reports she has had outpatient swallow study and has been given instructions to take medications with puree and no straws.  States she has complied with swallowing instructions and not used any straws.  Does report that she only has top dentures that are not fitting and has lost her bottom dentures a while ago.  Encouraged patient to contact her insurance company to see if there is a denture benefit.  Patient verbalizes family is still in the home to assist her with her care.  Ambulating with a walker and using a wheelchair at times.  Patient continues to have some stuttering, aphasia, and right sided weakness.  Encouraged patient to discuss outpatient therapies (speech and physical) with the neurologist on her upcoming appointment in January.  Appointments:  Patient states her next appointment with her Primary Care Provider, [name] is 05/08/2018.  Reports seeing Dr. Chalmers Cater, Endocrinologist on 09/28/2017.  Seen Dr. Irish Lack, Cardiologist 09/05/2017 and has a scheduled echocardiogram on 10/12/2017.  Plan:  RN Health Coach to make next monthly telephone outreach to patient in the month of  January.   [place] [phone] [name]

## 2017-10-09 ENCOUNTER — Other Ambulatory Visit: Payer: Self-pay | Admitting: Interventional Cardiology

## 2017-10-12 ENCOUNTER — Ambulatory Visit (HOSPITAL_COMMUNITY): Payer: Medicare Other | Attending: Cardiovascular Disease

## 2017-10-12 ENCOUNTER — Other Ambulatory Visit: Payer: Self-pay

## 2017-10-12 DIAGNOSIS — I130 Hypertensive heart and chronic kidney disease with heart failure and stage 1 through stage 4 chronic kidney disease, or unspecified chronic kidney disease: Secondary | ICD-10-CM | POA: Insufficient documentation

## 2017-10-12 DIAGNOSIS — I272 Pulmonary hypertension, unspecified: Secondary | ICD-10-CM | POA: Insufficient documentation

## 2017-10-12 DIAGNOSIS — N189 Chronic kidney disease, unspecified: Secondary | ICD-10-CM | POA: Insufficient documentation

## 2017-10-12 DIAGNOSIS — I471 Supraventricular tachycardia: Secondary | ICD-10-CM | POA: Diagnosis not present

## 2017-10-12 DIAGNOSIS — I5032 Chronic diastolic (congestive) heart failure: Secondary | ICD-10-CM | POA: Diagnosis not present

## 2017-10-12 DIAGNOSIS — I081 Rheumatic disorders of both mitral and tricuspid valves: Secondary | ICD-10-CM | POA: Diagnosis not present

## 2017-10-12 DIAGNOSIS — I447 Left bundle-branch block, unspecified: Secondary | ICD-10-CM | POA: Insufficient documentation

## 2017-10-18 DIAGNOSIS — Z7901 Long term (current) use of anticoagulants: Secondary | ICD-10-CM | POA: Diagnosis not present

## 2017-10-25 ENCOUNTER — Telehealth: Payer: Self-pay

## 2017-10-25 NOTE — Telephone Encounter (Signed)
Fax received from [place] at [facility] stating that the patient has gained 6.2 lb in 3 days and has had fatigue for 3 days. Called and spoke to patient who states that she takes lasix 80 mg BID and potassium 20 mEq QD. Patient states that she took an extra 40 mg of lasix a day for 2 days and her weight has returned to normal. Patient states that the swelling in her legs that she was having has improved and she denies any SOB. Patient states that she feels better now. Patient states that she had been eating bad during the holidays. Advised the patient to limit the amount of salt in her diet, continue her current medicines, and elevate her legs to help with swelling. Advised patient to let us know if her symptoms return or worsen. Patient verbalized understanding and thanked me for the call.

## 2017-11-01 ENCOUNTER — Ambulatory Visit: Payer: Medicare Other | Admitting: Neurology

## 2017-11-01 ENCOUNTER — Telehealth: Payer: Self-pay

## 2017-11-01 NOTE — Telephone Encounter (Signed)
Patient had check in for 1100am to see Dr.Xu for second opinion. Pt was seen by Dr. Jaynee Eagles in 08/2017 for speech issues. Per.Dr. Jaynee Eagles pt did not get her MRI brain, and MRA head done as order. Per GI pt was call 09/04/2017 to schedule test, and pt decline. Pt told GI she did not want to schedule until she speak with Dr. Jaynee Eagles. RN went to lobby and spoke with pt,and granddaughter. Rn stated Dr. Erlinda Hong cannot do a consult visit if she did not get her MRI of head, and MRI brain. Dr Erlinda Hong stated he cannot do a visit and do a plan of care with scans. This was all relayed to patient, and her granddaughter in the lobby. Rn gave the granddaughter GI number to schedule both images. Rn went to [place] and she credit the pts card back for 30.00 dollars. Pt's visit was cancel so it wont be a no show visit. Pt was told to call back and speak with Dr.Ahern or her nurse if she needs to see Dr. Erlinda Hong once she get both scans done.

## 2017-11-01 NOTE — Telephone Encounter (Signed)
If patient or family member calls she needs to speak with Dr. Jaynee Eagles or the nurse if she needs to schedule for second opinion with Dr. Erlinda Hong. Pt needs to have both MRI head and Mri brain done.

## 2017-11-03 ENCOUNTER — Encounter: Payer: Self-pay | Admitting: *Deleted

## 2017-11-03 ENCOUNTER — Other Ambulatory Visit: Payer: Self-pay | Admitting: *Deleted

## 2017-11-03 NOTE — Patient Outreach (Signed)
[place] [facility]) Care Management  11/03/2017  [name] 09-02-1933 [id number]  RN Health Coach Monthly Outreach  Referral Date:11/22/2016 Referral Source:THN Screening Reason for Referral:Disease Management/Education Insurance:[employer] Medicare   Outreach Attempt:  Successful telephone outreach to patient for monthly follow up.  HIPAA confirmed.  Patient states she is doing well.  Admits her diet was not the best over the holidays and states she is trying to get back on track.  Weights daily with automatic scale.  Had a period of weight gain, related to sodium intake.  Patient reports she increased her dose of lasix when weight was up and has spoken with Cardiologist office.  Weight this morning 200 pounds, per patient within her range.  Denies any shortness of breath.  Does report some lower extremity edema, she relates to her "knees".  Patient encouraged to contact her Cardiologist if she continued to have lower extremity edema.  Monitors blood sugars 3 times a day.  Fasting blood sugar this morning 191 with ranges of 170-200.  Patient stating she is starting to do better with her diet.  Continues to report right sided weakness and states her stuttering and aphasia is a little better.  Appointments: Scheduled appointment with neurologist was cancelled due to patient not having MRI/MRA of brain.  Patient encouraged to have family assist her with scheduling the MRI/MRA and rescheduling her neurology appointment.  Patient states she has a scheduled appointment with [name] this month, she is unsure of the day.  Plan:   RN Health Coach will make next monthly outreach to patient in the month of February.   [place] [phone] [email or web address]

## 2017-11-09 NOTE — Telephone Encounter (Signed)
Pt called she is wanting to have scans done now. GI needs new orders. Please call to advise her at [phone]

## 2017-11-09 NOTE — Telephone Encounter (Signed)
Called patient and discussed that MRI brain & MRA head have already been ordered and that she is to call GI to have this scheduled. I gave her the number to GI [phone]. She verbalized understanding and appreciation.

## 2017-11-14 DIAGNOSIS — I50812 Chronic right heart failure: Secondary | ICD-10-CM | POA: Diagnosis not present

## 2017-11-14 DIAGNOSIS — I482 Chronic atrial fibrillation: Secondary | ICD-10-CM | POA: Diagnosis not present

## 2017-11-14 DIAGNOSIS — E1122 Type 2 diabetes mellitus with diabetic chronic kidney disease: Secondary | ICD-10-CM | POA: Diagnosis not present

## 2017-11-14 DIAGNOSIS — N183 Chronic kidney disease, stage 3 (moderate): Secondary | ICD-10-CM | POA: Diagnosis not present

## 2017-11-14 DIAGNOSIS — I130 Hypertensive heart and chronic kidney disease with heart failure and stage 1 through stage 4 chronic kidney disease, or unspecified chronic kidney disease: Secondary | ICD-10-CM | POA: Diagnosis not present

## 2017-11-14 DIAGNOSIS — Z7901 Long term (current) use of anticoagulants: Secondary | ICD-10-CM | POA: Diagnosis not present

## 2017-11-16 DIAGNOSIS — Z7901 Long term (current) use of anticoagulants: Secondary | ICD-10-CM | POA: Diagnosis not present

## 2017-11-17 ENCOUNTER — Ambulatory Visit
Admission: RE | Admit: 2017-11-17 | Discharge: 2017-11-17 | Disposition: A | Discharge status: Home / Self Care | Payer: Medicare Other | Source: Ambulatory Visit | Attending: Neurology | Admitting: Neurology

## 2017-11-17 DIAGNOSIS — R1319 Other dysphagia: Secondary | ICD-10-CM

## 2017-11-17 DIAGNOSIS — R4701 Aphasia: Secondary | ICD-10-CM | POA: Diagnosis not present

## 2017-11-17 DIAGNOSIS — I639 Cerebral infarction, unspecified: Secondary | ICD-10-CM | POA: Diagnosis not present

## 2017-11-17 DIAGNOSIS — R29818 Other symptoms and signs involving the nervous system: Secondary | ICD-10-CM

## 2017-11-20 ENCOUNTER — Telehealth: Payer: Self-pay | Admitting: *Deleted

## 2017-11-20 ENCOUNTER — Other Ambulatory Visit: Payer: Self-pay | Admitting: Interventional Cardiology

## 2017-11-20 NOTE — Telephone Encounter (Addendum)
Called patient and discussed MRI brain & MRA head normal for her age. She verbalized understanding and appreciation.   ----- Message from Melvenia Beam, MD sent at 11/18/2017  9:22 AM EST ----- MRI brain normal for age  MRA head normal for age

## 2017-12-08 ENCOUNTER — Other Ambulatory Visit: Payer: Self-pay | Admitting: *Deleted

## 2017-12-08 NOTE — Patient Outreach (Signed)
[place] [facility]) Care Management  12/08/2017  JANNINE ABREU October 02, 1933 [id number]   RN Health Coach Monthly Outreach  Referral Date:11/22/2016 Referral Source:THN Screening Reason for Referral:Disease Management/Education Insurance:[employer] Medicare   Outreach Attempt:  Outreach attempt #1 to patient for monthly follow up. No answer. RN Health Coach unable to leave voice message due to voicemail not available.  Plan:  RN Health Coach will make another outreach attempt to patient within one business day if no return call back from patient.  [place] [phone] [email or web address]

## 2017-12-11 ENCOUNTER — Other Ambulatory Visit: Payer: Self-pay | Admitting: *Deleted

## 2017-12-11 ENCOUNTER — Encounter: Payer: Self-pay | Admitting: *Deleted

## 2017-12-11 NOTE — Patient Outreach (Addendum)
[place] [facility]) Care Management  [place]  12/11/2017   [name] November 30, 1932 [id number]   RN Health Coach Monthly Outreach   Referral Date: 11/22/2016 Referral Source: [facility] Screening Reason for Referral: Disease Management/Education Insurance: [employer]   Outreach Attempt:   Successful telephone outreach to patient for monthly follow up.  HIPAA verified with patient.  Patient stating she is having pain in her right hip, knee, and lower extremities.  Reports swelling in her bilateral lower extremities, mainly in her right leg from her "ankle up to her knee".  Patient reports her weight is up.  Weight this morning is 203.8 pounds with her normal range being 197-200 pounds.  Patient weights on telemonitoring scale and she states a nurse from [employer] typically calls her when her weight is increased.  Denies shortness of breath or dry hacking cough.  Patient encouraged to contact her Cardiologist with increased swelling and weight gain for possible recommendations.  Reports this mornings fasting blood sugar was 164, with reporting ranges of 110-140's the past month.  Unsure of her current A1C and when it was last checked.  Patient also noting some neuropathy like pain in her legs.  Encouraged patient to contact Dr. Gardiner Barefoot office for neuropathy pain and her current Hgb A1C status.  Patient states she did have her MRI/MRA completed and still needs to follow up with the neurologist.  Reports continued stuttering and right sided weakness  Encouraged patient to reschedule appointment with neurologist and to discuss possible physical and speech therapies.  Encounter Medications:  Outpatient Encounter Medications as of 12/11/2017  Medication Sig Note  . ACCU-CHEK AVIVA PLUS test strip USE AS DIRECTED TO CHECK BLOOD SUGAR QID   . acetaminophen (TYLENOL) 500 MG tablet Take 1,000 mg by mouth every 6 (six) hours as needed.   [name] amiodarone (PACERONE) 200 MG  tablet TAKE 1 TABLET BY MOUTH DAILY   . atorvastatin (LIPITOR) 20 MG tablet Take 20 mg by mouth daily.   . BD PEN NEEDLE NANO U/F 32G X 4 MM MISC U UTD QID FOR 30 DAYS   . calcium carbonate (OS-CAL) 600 MG TABS Take 600 mg by mouth 2 (two) times daily with a meal.   . Cyanocobalamin (VITAMIN B 12 PO) Take 1 tablet daily by mouth.   . ferrous gluconate (FERGON) 324 MG tablet Take 325 mg by mouth daily with breakfast.    . furosemide (LASIX) 80 MG tablet TAKE 1 TABLET BY MOUTH TWICE DAILY   . gabapentin (NEURONTIN) 100 MG capsule    . HUMALOG KWIKPEN 100 UNIT/ML KiwkPen Inject into the skin 3 (three) times daily. AS DIRECTED   . LEVEMIR FLEXTOUCH 100 UNIT/ML Pen Inject 12 units in the am and 12 units at night into skin as directed 10/06/2017: Patient reports insulin was adjusted to 10 units at bedtime and 14 units every am  . losartan (COZAAR) 100 MG tablet Take 0.5 tablets (50 mg total) by mouth daily.   . magnesium oxide (MAG-OX) 400 MG tablet Take 400 mg by mouth daily.   . metoprolol succinate (TOPROL-XL) 50 MG 24 hr tablet Take 1 tablet (50 mg total) by mouth daily. Take with or immediately following a meal.   . oxybutynin (DITROPAN) 5 MG tablet Take 5 mg by mouth 2 (two) times daily.   . potassium chloride (MICRO-K) 10 MEQ CR capsule Take 4 tablets by mouth X's 1 day then go back to 2 tablets by mouth daily   .  Vitamin D, Ergocalciferol, (DRISDOL) 50000 UNITS CAPS Take 5,000 Units by mouth 2 (two) times a week.    . warfarin (COUMADIN) 10 MG tablet Take 10 mg by mouth daily. Takes 7.5 mg along with a 10 mg tablet DAILY,, TEUSDAY and THURSDAY takes 3.75 mg   . Colesevelam HCl 3.75 G PACK Take 3.75 g by mouth daily.   [name] omeprazole (PRILOSEC) 40 MG capsule Take 40 mg by mouth daily.    No facility-administered encounter medications on file as of 12/11/2017.     Functional Status:  In your present state of health, do you have any difficulty performing the following activities: 11/03/2017  08/10/2017  Hearing? N N  Vision? N N  Difficulty concentrating or making decisions? N N  Walking or climbing stairs? Y Y  Dressing or bathing? Tempie Donning  Comment gets assistance with baths -  Doing errands, shopping? Tempie Donning  Preparing Food and eating ? Y N  Using the Toilet? Y N  In the past six months, have you accidently leaked urine? Y Y  Comment - -  Do you have problems with loss of bowel control? N N  Managing your Medications? Tempie Donning  Comment aide assist with medications -  Managing your Finances? N N  Housekeeping or managing your Housekeeping? Y Y  Some recent data might be hidden    Fall/Depression Screening: Fall Risk  12/11/2017 11/03/2017 09/01/2017  Falls in the past year? No No No  Comment - - -  Number falls in past yr: - - -  Injury with Fall? - - -  Risk for fall due to : - - -  Follow up - - -   PHQ 2/9 Scores 09/01/2017 08/10/2017 05/19/2017 11/29/2016 11/22/2016  PHQ - 2 Score 0 0 0 0 0    THN CM Care Plan Problem One     Most Recent Value  Care Plan Problem One  Knowledge deficiet related to importance of medical follow up appointments  Role Documenting the Problem One  [place] for Problem One  Active  [facility] Long Term Goal   Patient will report a decrease in her A1C by 1 point in the next 90 days.  THN Long Term Goal Start Date  12/11/17  THN Long Term Goal Met Date  12/11/17  Interventions for Problem One Long Term Goal  Reviewed and discussed current care plan with patient,  Patient verbalized understanding, Ecouraged patient to continue with her sliding scale insulin, encouraged continued monitoring of blood sugars and medication compliance, discussed healthier meal options for patient, reviewed signs and symptoms of hyper and hypoglycemia with patient  THN CM Short Term Goal #1   Patient will speak with neurologist concerning need for outpatien speech and physical therapy in the next 30 days.  THN CM Short Term Goal #1 Start Date  12/11/17   Interventions for Short Term Goal #1  Discussed with patient the need for speech therapy to help with aphasia and finding words, discussed with patient the need for physical therapy to increase stregnth and endurance to assist patient with her goal of utilizing the wheelchair less, encouraged patient to discuss therapies with neurologist at rescheduled appointment, encouraged patient to reschedule neurology appointment, encouraged patient to discuss therapies with primary care provider  [facility] CM Short Term Goal #2   Patient will speak with Dr. Debbora Presto about rescheduling her appointment for a close date in the next 30 days.  THN CM Short Term Goal #2  Start Date  12/11/17  THN CM Short Term Goal #2 Met Date  12/11/17  Interventions for Short Term Goal #2  encouraged patient to call Dr. Nash Dimmer office to reschedule appointment for sooner date,  encouraged patient to discuss treatment of neuropathy with physician, encouraged patient to discuss current Hgb A1c and what the goal A1C will be, encouraged patient to take log of blood sugars ot meter to appointment with her so physician can review readings and adjust medications accordingly  THN CM Short Term Goal #3  Patient will reschedule neurologist appointment within the next 30 days.  THN CM Short Term Goal #3 Start Date  12/11/17  Interventions for Short Tern Goal #3  Discussed with patient the need to see neurologist after completion of MRI/MRA, encouraged patient to schedule follow up or second opinion appointment  with neurologist,  encouraged patient to seek the assistance of her granddaughter to schedule her appointments      Appointments:  Patient reports seeing [name], primary care provider in January 2019 and thinks her next follow up appointment is in May 2019.  States she has scheduled appointment in May 2019 with Dr. Debbora Presto, but is wishing to reschedule this appointment for a sooner one.  Verbalizes she needs to schedule follow up with  neurologist for her second opinion and post MRI/MRA follow up.  Plan: RN Health Coach will make next monthly outreach to patient in the month of March.   RN Health Coach to send Quarterly Update to primary care MD.  Hubert Azure RN [place] [phone] [name]

## 2017-12-14 DIAGNOSIS — Z7901 Long term (current) use of anticoagulants: Secondary | ICD-10-CM | POA: Diagnosis not present

## 2017-12-29 DIAGNOSIS — K130 Diseases of lips: Secondary | ICD-10-CM | POA: Diagnosis not present

## 2017-12-29 DIAGNOSIS — R5383 Other fatigue: Secondary | ICD-10-CM | POA: Diagnosis not present

## 2017-12-29 DIAGNOSIS — I130 Hypertensive heart and chronic kidney disease with heart failure and stage 1 through stage 4 chronic kidney disease, or unspecified chronic kidney disease: Secondary | ICD-10-CM | POA: Diagnosis not present

## 2017-12-29 DIAGNOSIS — N183 Chronic kidney disease, stage 3 (moderate): Secondary | ICD-10-CM | POA: Diagnosis not present

## 2017-12-29 DIAGNOSIS — I50812 Chronic right heart failure: Secondary | ICD-10-CM | POA: Diagnosis not present

## 2018-01-09 ENCOUNTER — Other Ambulatory Visit: Payer: Self-pay | Admitting: *Deleted

## 2018-01-09 NOTE — Patient Outreach (Addendum)
[place] [facility]) Care Management  01/09/2018  [name] [birth year] [id number]   RN Health Coach Monthly Outreach  Referral Date:11/22/2016 Referral Source:THN Screening Reason for Referral:Disease Management/Education Insurance:[employer] Medicare   Outreach Attempt:  Outreach attempt #1 to patient for monthly follow up. No answer and unable to leave voicemail message due to no voicemail available.  Plan:  RN Health Coach will make another outreach attempt to patient within one business day if no return call back from patient.  [place] [phone] [email or web address]

## 2018-01-10 ENCOUNTER — Other Ambulatory Visit: Payer: Self-pay | Admitting: *Deleted

## 2018-01-10 ENCOUNTER — Encounter: Payer: Self-pay | Admitting: *Deleted

## 2018-01-10 NOTE — Patient Outreach (Addendum)
[facility]) Care Management  01/10/2018  [name] 1933/01/28 [id number]   RN Health Coach Monthly Outreach  Referral Date:11/22/2016 Referral Source:THN Screening Reason for Referral:Disease Management/Education Insurance:[employer] Medicare   Outreach Attempt:  Successful telephone outreach to patient for monthly follow up.  HIPAA verified with patient.  Patient reporting she has been having an increase in lower extremity edema throughout the day and she is up moving around.  States edema resolves when she is in bed sleeping and utilizing pillows to elevate her legs.  Reports her primary care provider advised her to follow a no sodium diet and to take an extra 1/2 tablet of Lasix (40mg ) when swelling occurs.  Weight today 199.8 pounds, which is down after several days of the extra lasix doses.  Patient continues to report pain in her knee and wearing knee brace during the day to help with pain and swelling. Fasting blood sugar this morning is 225 with patient reporting variable ranges and stating she does not feel her CBG meter is accurate.  Discussed with patient, Hgb A1C was up to 8.3 on 11/14/2017.  Encouraged patient to contact insurance company to verify which CBG meter is covered and then to request a prescription for that meter from her primary care provider or endocrinologist.  Patient reporting difficulties with affording her monthly medications, at this time she can not verbalize which medication she has trouble affording although she does state at times she can not afford her insulin pens.  Does state her family helps her pay for her medications about twice a month.  Discussed [place] referral and patient verbally agrees.  Also discussed with patient her Over the Counter Quarterly Benefit with her [employer].  Encouraged patient to speak with her insurance company to retrieve booklet and verify her quarterly amount.  Appointments:  Patient  reports seeing [name] about 2 weeks ago, unsure of the exact date.  States she is due for follow up with [name] in June 2019.  Patient has not rescheduled her appointment with Neurologist, stating she does not have the money for the co pay and will do when she is able.  Scheduled to see Dr. Chalmers Cater, Endocrinologist in May 2019.  Encouraged patient to possible reschedule the Endocrinologist appointment to sooner with increased blood sugars and increased Hgb A1C.  Patient stated her understanding.  Plan: RN Health Coach will place [place] referral to assist with affording medications and medication reconciliation. RN Health Coach will make next monthly outreach to patient in the month of April.  [place] [phone] [email or web address]

## 2018-01-10 NOTE — Addendum Note (Signed)
Addended by: Hubert Azure D on: 01/10/2018 01:35 PM   Modules accepted: Orders

## 2018-01-11 ENCOUNTER — Other Ambulatory Visit: Payer: Self-pay | Admitting: Pharmacist

## 2018-01-11 DIAGNOSIS — Z7901 Long term (current) use of anticoagulants: Secondary | ICD-10-CM | POA: Diagnosis not present

## 2018-01-11 NOTE — Addendum Note (Signed)
Addended by: Bertis Ruddy on: 01/11/2018 04:55 PM   Modules accepted: Orders

## 2018-01-11 NOTE — Patient Outreach (Signed)
[facility]) Care Management  01/11/2018  [name] 10-05-1933 [id number]  84 y.o. year old female referred to [place] [facility] for Medication Assistance ([place])  Patient with [employer] advantage plan.   Patient confirms identity with HIPAA-identifiers x2 and gives verbal consent to speak over the phone about medications.    SUBJECTIVE:   Medication Assistance:  Patient inquired about assistance paying for her prescriptions, especially insulin. Patient states (verified by pharmacy) that her copays for insulin and her brand name medications (Pacerone, Coumadin) are $8.50.    Medication Management:  Reviewed medication list with patient and fill history with patient's preferred pharmacy. Patient uses a pill box that she fills on Wednesday. Her grandaughter, Elmo Putt, checks her pill box for her. Patient states she also has an aid that comes to her home Tuesday-Friday that will sometimes assist her with her medications. Patient appeared to have good understanding of her medications throughout our conversation and mentioned keeping notebooks of her blood sugars and weighing herself daily.Pharmacy fill history and patient's reported medication list match.  OBJECTIVE: Medications Reviewed Today    Reviewed by Bertis Ruddy, [facility] (Pharmacist) on 01/11/18 at 1418  Med List Status: <None>  Medication Order Taking? Sig Documenting Provider Last Dose Status Informant  ACCU-CHEK AVIVA PLUS test strip [id number] No USE AS DIRECTED TO CHECK BLOOD SUGAR QID [provider] Not Taking Active            Med Note Ronnald Ramp, Zenon Mayo   Fri Apr 08, 2016  9:52 AM)    acetaminophen (TYLENOL) 500 MG tablet [id number] Yes Take 1,000 mg by mouth every 6 (six) hours as needed. [provider] Taking Active   amiodarone (PACERONE) 200 MG tablet [id number] Yes TAKE 1 TABLET BY MOUTH DAILY Jettie Booze, MD Taking Active    atorvastatin (LIPITOR) 20 MG tablet [id number] Yes Take 20 mg by mouth daily. [provider] Taking Active Self  BD PEN NEEDLE NANO U/F 32G X 4 MM MISC [id number] Yes U UTD QID FOR 30 DAYS [provider] Taking Active            Med Note Ronnald Ramp, Zenon Mayo   Fri Apr 08, 2016  9:52 AM)    calcium carbonate (OS-CAL) 600 MG TABS [id number] Yes Take 600 mg by mouth daily with breakfast.  [provider] Taking Active Self        Discontinued 01/11/18 1418 (Patient Preference)   Cyanocobalamin (VITAMIN B 12 PO) [id number] Yes Take 1 tablet daily by mouth. [provider] Taking Active   ferrous gluconate (FERGON) 324 MG tablet [id number] Yes Take 325 mg by mouth daily with breakfast.  [provider] Taking Active Self  furosemide (LASIX) 80 MG tablet [id number] Yes TAKE 1 TABLET BY MOUTH TWICE DAILY Jettie Booze, MD Taking Active            Med Note Hubert Azure D   Wed Jan 10, 2018 12:38 PM) Taking 1 tablet twice a day and take an extra 1/2 tablet  gabapentin (NEURONTIN) 300 MG capsule [id number] Yes Take 300 mg by mouth at bedtime.  [provider] Taking Active            Med Note Freada Bergeron   Fri Apr 08, 2016  9:30 AM)    Ruthell Rummage KWIKPEN 200 UNIT/ML SOPN [id number] Yes Inject into the skin 3 (three) times daily. AS DIRECTED [provider] Taking Active  Med Note Tye Savoy, Harper Vandervoort E   Thu Jan 11, 2018  1:33 PM) Sliding Scale 100-150 mg/dL take 4 units; 151-200 mg/dL take 5 units;201-260 mg/dL take 6 units  LEVEMIR FLEXTOUCH 100 UNIT/ML Pen [id number] Yes Inject 12 units in the am and 12 units at night into skin as directed [provider] Taking Active Self           Med Note Shelby Mattocks, [facility]   Fri Oct 06, 2017 10:40 AM) Patient reports insulin was adjusted to 10 units at bedtime and 14 units every am  losartan (COZAAR) 100 MG tablet [id number] Yes Take 0.5 tablets (50 mg total) by mouth daily. Jettie Booze, MD Taking Active   magnesium oxide (MAG-OX) 400 MG tablet [id number] Yes Take 400 mg by mouth daily. [provider] Taking Active Self  metoprolol succinate (TOPROL-XL) 50 MG 24 hr tablet [id number] Yes Take 1 tablet (50 mg total) by mouth daily. Take with or immediately following a meal. Jettie Booze, MD Taking Active   omeprazole (PRILOSEC) 40 MG capsule [id number] Yes Take 40 mg by mouth daily. [provider] Taking Active   oxybutynin (DITROPAN) 5 MG tablet [id number] Yes Take 5 mg by mouth 2 (two) times daily. [provider] Taking Active Self  potassium chloride (MICRO-K) 10 MEQ CR capsule [id number] Yes Take 4 tablets by mouth X's 1 day then go back to 2 tablets by mouth daily Consuelo Pandy, PA-C Taking Active   Vitamin D, Ergocalciferol, (DRISDOL) 50000 UNITS CAPS [id number] No Take 5,000 Units by mouth 2 (two) times a week.  [provider] Not Taking Active Self           Med Note Tye Savoy, Garnetta Buddy Jan 11, 2018  1:37 PM) Uses OTC product, Vitamin D3, daily instead  warfarin (COUMADIN) 7.5 MG tablet [id number] Yes Take 7.5 mg by mouth daily. Takes 7.5 mg Monday through Friday, and a half tablet Saturday Sunday. Jettie Booze, MD Taking Active Self           Med Note Lesia Hausen Jan 11, 2018  1:36 PM) Uses Brand name  Med List Note Candie Mile, RN 07/28/17 1114): SW referral. SW previously worked with patient's daughter concerning available assisted living placement for patient.  However, patient states that the daughter looked at several facilities, but felt they were too expensive.  Patient was unaware that her Medicaid may assist with placement expenses.  Patient stated that living alone is getting too hard for her to manage.  She currently has a CNA for 2 hours a day five days a week.         ASSESSMENT/PLAN:   Drugs sorted by system:  Neurologic/Psychologic: Gabapentin    Cardiovascular: Amiodarone (Pacerone) Atorvastatin Furosemide  Losartan Metoprolol succinate Warfarin (Coumadin)   Gastrointestinal: Omeprazole   Endocrine: Humalog Kwikpen Levemir Flextouch:  Pain: acetaminophen  Vitamins/Minerals: Calcium carbonate Cyanocobalamin Ferrous gluconate Magnesium oxide Potassium chloride Ergocalciferol  Miscellaneous: Oxybutynin   Gaps in therapy: metformin (eGFR 12/29/17 = 47 mL/min) Medications to avoid in the elderly: sliding scale insulin, warfarin, omeprazole Drug interactions: warfarin-amiodarone (INR today was 4.1; patient spoke with her nurse who monitors and warfarin regimen was adjusted. Reflected today's dose adjustment in the chart) Other issues noted: Patient states her glucose monitor is "out of date"   1. Medication Assistance: Performed extra help inquiry for patient. Patient receives full extra help at this time and would not qualify for  additional patient assistance from drug manufacturers. Will place a referral to social work to review finances and budgeting.   2. Medication Adherence: Performed comprehensive medication review with patient. Updated medication list. Patient states she believes her glucometer is "out of date". Will investigate glucometers that are covered by [employer]. Will contact PCP ([name]) and ask for new prescription for glucometer and diabetes testing supplies to be sent in to patient's preferred pharmacy. Will follow up with patient in one week to check the status of the glucometer.   Charlett Lango, PharmD Clinical Pharmacist, [facility] [phone]

## 2018-01-12 ENCOUNTER — Other Ambulatory Visit: Payer: Self-pay | Admitting: *Deleted

## 2018-01-12 NOTE — Patient Outreach (Signed)
[facility]) Care Management  01/12/2018  MADYSEN FAIRCLOTH Dec 18, 1932 [id number]   CSW made an initial attempt to try and contact patient today to perform phone assessment, as well as assess and assist with social needs and services, without success.  A HIPAA compliant message was left for patient on voicemail.  CSW is currently awaiting a return call. CSW will make a second outreach attempt on Monday, January 15, 2018, if CSW does not receive a return call from patient in the meantime. [name], BSW, MSW, LCSW  Licensed Education officer, environmental Health System  Mailing [place] [address], [place], [place] [address] Physical Address-[address], [place], [place] [address] Toll Free Main # [phone] Fax # [phone] Cell # [phone]  Office # [phone] [email or web address]

## 2018-01-15 ENCOUNTER — Encounter: Payer: Self-pay | Admitting: *Deleted

## 2018-01-15 ENCOUNTER — Other Ambulatory Visit: Payer: Self-pay | Admitting: *Deleted

## 2018-01-15 ENCOUNTER — Other Ambulatory Visit: Payer: Self-pay | Admitting: Pharmacist

## 2018-01-15 NOTE — Patient Outreach (Signed)
[facility]) Care Management  01/15/2018  [name] 24-Mar-1933 [id number]   84 y.o. year old female referred to [facility] for Medication Assistance and Case Closure (Pharmacy case closure)  Patient with [employer] advantage plan.    Patient confirms identity with HIPAA-identifiers x2 and gives verbal consent to speak over the phone about medications.   Medication Assistance for Glucometer:  Received call back from Dr. Gala Murdoch office regarding a new glucometer and testing supplies for patient. Nurse sent in a new prescription for AccuChek Nano and associated testing supplies to patient's pharmacy. This is one of the preferred glucometers for [facility].   Called patient's preferred pharmacy, [facility] on [address] in [place], to ensure they received prescription. Pharmacy technician states they had received the prescriptions and had started to fill them. The copay will be $0 for the new meter and the supplies.   Called patient to update her on the new meter. Patient was grateful. Informed patient that the pharmacy case would now be closed, but LCSW Di Kindle would still work with her to help her with budgeting and finances. Gave patient my number if she has further questions. Will close pharmacy case.   Charlett Lango, PharmD Clinical Pharmacist, [facility] [phone]

## 2018-01-15 NOTE — Patient Outreach (Signed)
[facility]) Care Management  01/15/2018  TARRY BLAYNEY [birth year] [id number]   CSW was able to make initial contact with patient today to perform phone assessment, as well as assess and assist with social work needs and services.  CSW introduced self, explained role and types of services provided through [facility] [employer] ([place]).  CSW further explained to patient that CSW works with patient's Pharmacist, also with [place] [facility], Charlett Lango. CSW then explained the reason for the call, indicating that Mrs. Tye Savoy thought that patient would benefit from social work services and resources to assist with obtaining financial resources to help pay for prescription medications.  CSW obtained three HIPAA compliant identifiers from patient, which included patient's name, date of birth and home address. Patient admits that she is currently taking a lot of different prescription medications and that she is often unable to afford to have them all filled each month.  Patient further reported that she needs a new CBG Meter, as her current one is not working properly, but she cannot afford to purchase a new one.  According to Mrs. Tye Savoy, patient already receives full extra; therefore, she does not qualify for financial assistance to help purchase prescription medications.  Patient reported that she does not qualify for Adult Medicaid, as she has already tried to receive assistance in the past but was told that she makes too much in social security income each month.  CSW agreed to meet with patient for an initial home visit to assist with completion of a Financial Assessment Form to see if patient qualifies for financial assistance to purchase a new CBG Meter.  In addition, CSW will assist patient with budgeting her money each month so that she will have enough financial resources to help pay for all of her monthly prescriptions.  Last, CSW will provide  patient with a List of Psychologist, sport and exercise.  Patient was not at all interested in attending Budget Management classes at [place].  The initial home visit is scheduled for Thursday, January 18, 2018 at 1:00 PM. [facility] CM Care Plan Problem One     Most Recent Value  Care Plan Problem One  Lack of financial resources to obtain prescription medications and purchase a CBG Meter.  Role Documenting the Problem One  Clinical Social Worker  Care Plan for Problem One  Active  [facility] CM Short Term Goal #1   Patient will meet with CSW for an initial home visit for CSW to complete a Financial Assessment Form to see if patient qualifies for a new CBG Meter, at the expense of [place] [employer], within the next week.  [facility]N CM Short Term Goal #1 Start Date  01/15/18  Interventions for Short Term Goal #1  The initial home visit is scheduled for Thursday, January 18, 2018 at 1:00 PM.  [facility] CM Short Term Goal #2   Patient will utilize the List of Insurance risk surveyor Assistance to try and offset monthly expenses, with regards to paying for rent and utilities, so that she is able to afford to purchase all prescribed medications, within the next 30 days.  THN CM Short Term Goal #2 Start Date  01/15/18  Interventions for Short Term Goal #2  CSW will provide patient with a List of Psychologist, sport and exercise.  THN CM Short Term Goal #3  Patient will review all monthly expesnes  with CSW so that CSW can offer budget management, so that patient is able to afford all prescription medications each month, within the next month.  THN CM Short Term Goal #3 Start Date  01/15/18  Interventions for Short Tern Goal #3  CSW will review all monthly expenses with patient and assist patient with managing her finances better each month.     [name], BSW, MSW, LCSW  Licensed Teacher, adult education Health System  Mailing [place] [address] [place], [place] [address] Physical Address-[address], [place], [place] [address] Toll Free Main # [phone] Fax # [phone] Cell # [phone]  Office # [phone] [email or web address]

## 2018-01-18 ENCOUNTER — Other Ambulatory Visit: Payer: Self-pay | Admitting: *Deleted

## 2018-01-18 ENCOUNTER — Ambulatory Visit: Payer: Self-pay | Admitting: Pharmacist

## 2018-01-18 NOTE — Patient Outreach (Signed)
[facility]) Care Management  01/18/2018  [name] 02-Dec-1932 [id number]   CSW was able to meet with patient today to perform the initial home visit.  CSW provided patient with a list of resources, which included all of the following: List of Community Agencies and [place] in [place] in [place] in [place] Patient reported that she plans to call various agencies on the List of [place] to see if she can get some help paying for her rent and utilities.  This, in turn, would offset patient's monthly expenses so that she is able to afford her prescription medications.  CSW offered counseling and supportive services while discussing patient's financial situation, offering suggestions with regards to areas that she may be able to cut back each month.  Patient indicated that she plans to get her daughter, [name] to pick up bags of food for her through local Food Banks and [employer], which will also help her to save money each month. Patient received her new CBG Meter on Wednesday, January 17, 2018 and plans to go to her pharmacist today to pick up glucose strips for the meter.  Patient's old CBG Meter is giving patient false readings; therefore, patient has been encouraged to no longer use the old meter.  Patient is still able to drive and has her own vehicle.  Patient is independent with all activities of daily living, but reports that her daughter and granddaughter bring her food, just about every evening, as they do not want patient to have to use her oven/stove.  Patient had a lot of swelling in her feet and ankles, for which CSW encouraged patient to contact her Primary Care Physician, Dr. Glendale [name].  Patient voiced understanding and was  agreeable to this plan. CSW will perform a case closure on patient, as all goals of treatment have been met from social work standpoint and no additional social work needs have been identified at this time. CSW will notify patient's RNCM with [facility] [facility], Raina Mina of CSW's plans to close patient's case.  CSW will fax an update to patient's Primary Care Physician, Dr. Glendale [name] to ensure that they are aware of CSW's involvement with patient's plan of care.  CSW will submit a case closure request to [name], [facility] with [facility], in the form of an [employer].   THN CM Care Plan Problem One     Most Recent Value  Care Plan Problem One  Lack of financial resources to obtain prescription medications and purchase a CBG Meter.  Role Documenting the Problem One  Clinical Social Worker  Care Plan for Problem One  Active  [facility] CM Short Term Goal #1   Patient will meet with CSW for an initial home visit for CSW to complete a Financial Assessment Form to see if patient qualifies for a new CBG Meter, at the expense of [place] [employer], within the next week.  THN CM Short Term Goal #1 Start Date  01/15/18  [facility] CM Short Term Goal #1 Met Date  01/18/18  Interventions for Short Term Goal #1  Patient has new CBG Meter and was planning to purchase strips for the meter at the pharmacist today.  THN CM Short Term Goal #2  Patient will utilize the List of [employer] to try and offset monthly expenses, with regards to paying for rent and utilities, so that she is able to afford to purchase all prescribed medications, within the next 30 days.  THN CM Short Term Goal #2 Start Date  01/15/18  [facility] CM Short Term Goal #2 Met Date  01/18/18  Interventions for Short Term Goal #2  Patient is calling various community agencies and resources to try and obtain financial assistance to  pay rent and utilities to offset monthly expenses so that she is able to afford to pay for her prescription medications.  THN CM Short Term Goal #3  Patient will review all monthly expesnes with CSW so that CSW can offer budget management, so that patient is able to afford all prescription medications each month, within the next month.  THN CM Short Term Goal #3 Start Date  01/15/18  [facility] CM Short Term Goal #3 Met Date  01/18/18  Interventions for Short Tern Goal #3  Patient is on a very fixed income and will try and save money where possible to be able to afford her prescription medications each month.    [name], BSW, MSW, LCSW  Licensed Education officer, environmental Health System  Mailing [address], [place], [place] [address] Physical Address-[address], [place], [place] [address] Toll Free Main # [phone] Fax # [phone] Cell # [phone]  Office # [phone] [email or web address]

## 2018-01-25 ENCOUNTER — Telehealth: Payer: Self-pay | Admitting: Interventional Cardiology

## 2018-01-25 DIAGNOSIS — N189 Chronic kidney disease, unspecified: Secondary | ICD-10-CM | POA: Diagnosis not present

## 2018-01-25 DIAGNOSIS — E7800 Pure hypercholesterolemia, unspecified: Secondary | ICD-10-CM | POA: Diagnosis not present

## 2018-01-25 DIAGNOSIS — E1121 Type 2 diabetes mellitus with diabetic nephropathy: Secondary | ICD-10-CM | POA: Diagnosis not present

## 2018-01-25 DIAGNOSIS — E1165 Type 2 diabetes mellitus with hyperglycemia: Secondary | ICD-10-CM | POA: Diagnosis not present

## 2018-01-25 DIAGNOSIS — I10 Essential (primary) hypertension: Secondary | ICD-10-CM | POA: Diagnosis not present

## 2018-01-25 NOTE — Telephone Encounter (Signed)
New Message    Are you calling to confirm a diagnosis or obtain personal health information (Y/N)? No 1) If so, what information is requested? Ejection Fraction   A voicemail can be left with the ejection fraction and the date of the study as well as a recent blood pressure and heart rate. Voicemail is secure.   Please route to Medical Records or your medical records site representative

## 2018-01-26 NOTE — Telephone Encounter (Signed)
Left vm need EF% form faxed over.

## 2018-02-06 ENCOUNTER — Other Ambulatory Visit: Payer: Self-pay | Admitting: Interventional Cardiology

## 2018-02-06 ENCOUNTER — Encounter: Payer: Self-pay | Admitting: *Deleted

## 2018-02-06 ENCOUNTER — Other Ambulatory Visit: Payer: Self-pay | Admitting: *Deleted

## 2018-02-06 NOTE — Patient Outreach (Addendum)
[name]) Care Management  02/06/2018  [name] 1933/04/29 [id number]   RN Health Coach Monthly Outreach  Referral Date:11/22/2016 Referral Source:THN Screening Reason for Referral:Disease Management/Education Insurance:[employer] Medicare   Outreach Attempt:  Successful telephone outreach to patient for monthly follow up.  HIPAA verified with patient.  Patient is hoarse and losing voice at time of the call.  Denies any fevers or cough or feeling not well.  Reports seeing Dr. Chalmers Cater last week and reviewing her diet and weight reduction options; patient declined Nutritionist offered by Dr. Chalmers Cater..  Patient reports she has received her new CBG meter from the pharmacy, but the meter has been reading error.  States she plans to take meter back to pharmacy to have them trouble shoot machine.  Patient encouraged to do so.  Reports using her old meter to check blood sugars recently.  CBG this morning was 109 with range of 100-200's.  Weight this morning was 199.8 which is reduced from her normal range of 201-202.  Patient does continue to report swelling in her legs and feet.  Denies any shortness of breath or chest pain.  Encouraged to contact her Cardiologist to be seen.  Appointments: Patient has seen her primary care physician in March 2019 and states she is unsure of when she will follow up.  States she will touch basis with [name] when her INR is due to be checked this week.  Reports seeing Dr. Chalmers Cater last week and will follow up with her in 6 months.  Patient states she will call Cardiologist, Dr. Marko Stai nasi to schedule appointment.  Patient encouraged to do so.  Plan: RN Health Coach will make next monthly telephone outreach in the month of May.  [place] [phone] [email or web address]

## 2018-02-08 DIAGNOSIS — Z7901 Long term (current) use of anticoagulants: Secondary | ICD-10-CM | POA: Diagnosis not present

## 2018-03-08 DIAGNOSIS — Z7901 Long term (current) use of anticoagulants: Secondary | ICD-10-CM | POA: Diagnosis not present

## 2018-03-12 ENCOUNTER — Other Ambulatory Visit: Payer: Self-pay | Admitting: *Deleted

## 2018-03-12 NOTE — Patient Outreach (Signed)
[facility]) Care Management  03/12/2018  [name] 1933/03/18 [id number]   RN Health Coach Monthly Outreach  Referral Date:11/22/2016 Referral Source:THN Screening Reason for Referral:Disease Management/Education Insurance:[employer] Medicare   Outreach Attempt:  Outreach attempt #1 to patient for monthly follow up. No answer. RN Health Coach left HIPAA compliant voicemail message along with contact information.  Plan:  RN Health Coach will send unsuccessful outreach letter to patient.  RN Health Coach will make another outreach attempt to patient within 3-4 business days if no return call back from patient.  [place] [phone] [email or web address]

## 2018-03-14 ENCOUNTER — Encounter: Payer: Self-pay | Admitting: *Deleted

## 2018-03-14 ENCOUNTER — Other Ambulatory Visit: Payer: Self-pay | Admitting: *Deleted

## 2018-03-14 DIAGNOSIS — I130 Hypertensive heart and chronic kidney disease with heart failure and stage 1 through stage 4 chronic kidney disease, or unspecified chronic kidney disease: Secondary | ICD-10-CM | POA: Diagnosis not present

## 2018-03-14 DIAGNOSIS — I482 Chronic atrial fibrillation: Secondary | ICD-10-CM | POA: Diagnosis not present

## 2018-03-14 DIAGNOSIS — I50812 Chronic right heart failure: Secondary | ICD-10-CM | POA: Diagnosis not present

## 2018-03-14 DIAGNOSIS — Z7901 Long term (current) use of anticoagulants: Secondary | ICD-10-CM | POA: Diagnosis not present

## 2018-03-14 NOTE — Patient Outreach (Addendum)
[name]) Care Management  [place]  03/14/2018   [name] [birth year] [id number]   RN Health Coach Monthly Outreach   Referral Date: 11/22/2016 Referral Source: [facility] Screening Reason for Referral: Disease Management/Education Insurance: [employer]   Outreach Attempt:  Successful telephone outreach to patient for monthly follow up.   HIPAA verified with patient.  Patient reports she is just returning from her doctor's appointment with her primary care provider.  Reports she is having increase right leg swelling with right "inner leg hard and turning black".  Patient states she has been followed by vascular physician in the past.  Reports she does have pain in the right leg and it is progressively becoming harder to ambulate with right leg.  States she is awaiting test results from PCP's office today.  Weight from this morning was 198.8, within range.  Fasting blood sugar this morning was elevated, 250, related to patient eating late last night.  Fasting ranges normally are between 130-140's.   Encounter Medications:  Outpatient Encounter Medications as of 03/14/2018  Medication Sig Note  . ACCU-CHEK AVIVA PLUS test strip USE AS DIRECTED TO CHECK BLOOD SUGAR QID   . acetaminophen (TYLENOL) 500 MG tablet Take 1,000 mg by mouth every 6 (six) hours as needed.   [name] amiodarone (PACERONE) 200 MG tablet TAKE 1 TABLET BY MOUTH DAILY   . atorvastatin (LIPITOR) 20 MG tablet Take 20 mg by mouth daily.   . BD PEN NEEDLE NANO U/F 32G X 4 MM MISC U UTD QID FOR 30 DAYS   . calcium carbonate (OS-CAL) 600 MG TABS Take 600 mg by mouth daily with breakfast.    . Cyanocobalamin (VITAMIN B 12 PO) Take 1 tablet daily by mouth.   . ferrous gluconate (FERGON) 324 MG tablet Take 325 mg by mouth daily with breakfast.    . furosemide (LASIX) 80 MG tablet TAKE 1 TABLET BY MOUTH TWICE DAILY 01/10/2018: Taking 1 tablet twice a day and take an extra 1/2 tablet  . gabapentin  (NEURONTIN) 300 MG capsule Take 300 mg by mouth at bedtime.    [name] HUMALOG KWIKPEN 200 UNIT/ML SOPN Inject into the skin 3 (three) times daily. AS DIRECTED 01/11/2018: Sliding Scale 100-150 mg/dL take 4 units; 151-200 mg/dL take 5 units;201-260 mg/dL take 6 units  . LEVEMIR FLEXTOUCH 100 UNIT/ML Pen Inject 12 units in the am and 12 units at night into skin as directed 10/06/2017: Patient reports insulin was adjusted to 10 units at bedtime and 14 units every am  . losartan (COZAAR) 100 MG tablet TAKE 1/2 TABLET(50 MG) BY MOUTH DAILY   . magnesium oxide (MAG-OX) 400 MG tablet Take 400 mg by mouth daily.   . metoprolol succinate (TOPROL-XL) 50 MG 24 hr tablet Take 1 tablet (50 mg total) by mouth daily. Take with or immediately following a meal.   . omeprazole (PRILOSEC) 40 MG capsule Take 40 mg by mouth daily.   [name] oxybutynin (DITROPAN) 5 MG tablet Take 5 mg by mouth 2 (two) times daily.   . potassium chloride (MICRO-K) 10 MEQ CR capsule Take 4 tablets by mouth X's 1 day then go back to 2 tablets by mouth daily   . Vitamin D, Ergocalciferol, (DRISDOL) 50000 UNITS CAPS Take 5,000 Units by mouth 2 (two) times a week.  01/11/2018: Uses OTC product, Vitamin D3, daily instead  . warfarin (COUMADIN) 7.5 MG tablet Take 7.5 mg by mouth daily. Takes 7.5 mg Monday through Friday, and  a half tablet Saturday Sunday. 01/11/2018: Uses Brand name   No facility-administered encounter medications on file as of 03/14/2018.     Functional Status:  In your present state of health, do you have any difficulty performing the following activities: 01/15/2018 11/03/2017  Hearing? N N  Vision? N N  Difficulty concentrating or making decisions? N N  Walking or climbing stairs? Y Y  Dressing or bathing? Y Y  Comment - gets assistance with baths  Doing errands, shopping? Tempie Donning  Preparing Food and eating ? Y Y  Using the Toilet? N Y  In the past six months, have you accidently leaked urine? N Y  Comment - -  Do you have problems  with loss of bowel control? N N  Managing your Medications? Y Y  Comment - aide assist with medications  Managing your Finances? Y N  Housekeeping or managing your Housekeeping? Y Y  Some recent data might be hidden    Fall/Depression Screening: Fall Risk  02/06/2018 01/15/2018 12/11/2017  Falls in the past year? No No No  Comment denies any recent falls - -  Number falls in past yr: - - -  Injury with Fall? - - -  Risk for fall due to : - - -  Follow up - - -   PHQ 2/9 Scores 01/15/2018 09/01/2017 08/10/2017 05/19/2017 11/29/2016 11/22/2016  PHQ - 2 Score 0 0 0 0 0 0    [facility] CM Care Plan Problem One     Most Recent Value  Care Plan Problem One  Knowledge deficiet related to importance of medical follow up appointments and self care management of diabetes and congestive heart failure.  Role Documenting the Problem One  [place] for Problem One  Active  [facility] Long Term Goal   Patient will report a decrease in her A1C by 0.5 point in the next 90 days.  [facility] Long Term Goal Start Date  03/14/18  Interventions for Problem One Long Term Goal  Care plan and goals reviewed and discussed with patient, medications reviewed and discussed medication compliance with patient, discussed healthier low carbohydrate meal and drink options, congratulated patient on reducing and cutting out sodas in diet, encouraged eating healthier snack at bedtime to prevent hypoglycemic episdes, reviewed signs and symptoms of hypo and hyperglycemia, encouraged patient to keep and attend medical appointments  [facility] CM Short Term Goal #1   Patient will report a decrease in pain and swelling in right leg in the next 30 days.  [facility] CM Short Term Goal #1 Start Date  03/14/18  [facility] CM Short Term Goal #1 Met Date  03/14/18  Interventions for Short Term Goal #1  Encouraged patient to elevated lower extremitites especially when sitting and lying down, reviewed medications and discussed compliance, encouraged patient to speak  with physician concerning recent lab work for possible need of vascular referral, Fall precautions and preventions discussed with patient  [facility] CM Short Term Goal #2   Patient will contact insurance to verify denture benefit in the next 30 days.  [facility] CM Short Term Goal #2 Start Date  03/14/18  [facility] CM Short Term Goal #2 Met Date  03/14/18  Interventions for Short Term Goal #2  Discussed need for dentures, encouraged patient to contact insurance company to verify denture benefits and list of dentist in network, discussed possibility of dentures helping reduce indigestion with ability to chew food completely, swallowing precautions reviewed with patient, encouraged patient to discuss swallowing difficulties  with physicians      Appointments:  Patient reports just returning from seeing [name] today and is due to see her again in 04/2018.  Has follow up cardiologist appointment with DR. [place] on 04/16/2018.  Plan:  RN Health Coach will make next monthly outreach to patient in the month of June.  [facility] will send primary care provider Quarterly Update.  [place] [phone] [email or web address]

## 2018-04-04 ENCOUNTER — Encounter (INDEPENDENT_AMBULATORY_CARE_PROVIDER_SITE_OTHER): Payer: Self-pay

## 2018-04-04 ENCOUNTER — Ambulatory Visit: Payer: Medicare Other | Admitting: Cardiology

## 2018-04-04 ENCOUNTER — Encounter: Payer: Self-pay | Admitting: Cardiology

## 2018-04-04 VITALS — BP 146/70 | HR 53 | Ht 65.0 in | Wt 202.0 lb

## 2018-04-04 DIAGNOSIS — R0609 Other forms of dyspnea: Secondary | ICD-10-CM | POA: Diagnosis not present

## 2018-04-04 DIAGNOSIS — I110 Hypertensive heart disease with heart failure: Secondary | ICD-10-CM | POA: Diagnosis not present

## 2018-04-04 DIAGNOSIS — I5032 Chronic diastolic (congestive) heart failure: Secondary | ICD-10-CM

## 2018-04-04 DIAGNOSIS — R0600 Dyspnea, unspecified: Secondary | ICD-10-CM

## 2018-04-04 MED ORDER — METOPROLOL SUCCINATE ER 25 MG PO TB24: 25.0000 mg | ORAL_TABLET | Freq: Every day | ORAL | 3 refills | Status: DC

## 2018-04-04 MED ORDER — FUROSEMIDE 80 MG PO TABS: 80.0000 mg | ORAL_TABLET | Freq: Two times a day (BID) | ORAL | 9 refills | Status: DC

## 2018-04-04 NOTE — Progress Notes (Signed)
Cardiology Office Note:    Date:  04/04/2018   ID:  WILLARD MADRIGAL, DOB February 01, 1933, MRN [id number]  PCP:  [name], MD  Cardiologist:  Larae Grooms, MD  Referring MD: [name], MD   Chief Complaint  Patient presents with  . Follow-up    Heart failure    History of Present Illness:    [name] is a 84 y.o. female with a past medical history significant for DVT (on Coumadin for this), chronic diastolic CHF, she also has deconditioning, obesity, HTN, SVT (correlated with pre-syncope, on amiodarone), DM, chronic anemia/thrombocytopenia, LBBB, probable CKD stage III (Cr 1.3 in 08/2014), mod MR by echo 2014 who presents for f/u. Per notes in 2013 she had an EPS with ablation for SVT which did not eliminate the SVT completely. She also had bradycardia which limited medication. She was placed on amiodarone by Dr. Lovena Le. Last echo 10/12/2017 EF 50-55%, mild LVH, mild MR, severe LAE, mild TR with severe pulmonary hypertension.  She was last seen in the office on 09/05/2017 by Dr. Irish Lack at which time the patient had been noted to be having some possible TIA symptoms.  She underwent carotid Dopplers that showed minimal plaque, no significant disease as well as echocardiogram which showed normal LV function and no obvious source of embolus.  A brain MRI on 11/17/2017 showed no active disease.  Today she is here today alone. She says that she was told by neurology that she did have a stroke that effected her speech and somewhat her swallowing. She is scheduled for speech therapy. She reports that she has been short of breath with exertion and fatigued. She has occasional substernal chest pain, non-radiating, with activity that resovles with rest.   She has increased lower leg edema. She has been taking her lasix 80 mg BID and occasionally an extra 40 mg with her am dose, about once a week. She feels occ few seconds of heart fluttering. Wt has been within 3 lbs over the last several  weeks.   She fell last week with no injury. She had been moving from her walker to the stove. She was not dizzy but was off balance.  She tries to avoid salt in her food. She has cut down on lunch meats but still eats some.    A1c 8 01/25/18   Past Medical History:  Diagnosis Date  . Anemia   . Arthritis   . Cataract   . Chronic diastolic CHF (congestive heart failure) ([place])   . CKD (chronic kidney disease), stage III ([place])   . Clotting disorder ([place])   . Diabetes mellitus ([place])   . Dizziness and giddiness   . DVT (deep venous thrombosis) ([place])    a. On Coumadin for this.  . Essential hypertension   . LBBB (left bundle branch block)   . Mitral regurgitation    a. Mod MR by echo 2014.  . Muscular deconditioning   . Obesity   . SVT (supraventricular tachycardia) ([place])    a. In 2013 she had an EPS with ablation for SVT which did not eliminate the SVT completely. She also had bradycardia which limited medication. She was placed on amiodarone by Dr. Lovena Le.  . Thrombocytopenia ([place]) 11/21/2011    Past Surgical History:  Procedure Laterality Date  . EYE SURGERY    . SUPRAVENTRICULAR TACHYCARDIA ABLATION N/A 10/11/2012   Procedure: SUPRAVENTRICULAR TACHYCARDIA ABLATION;  Surgeon: Evans Lance, MD;  Location: [facility] CATH LAB;  Service: Cardiovascular;  Laterality: N/A;    Current Medications: Current Meds  Medication Sig  . ACCU-CHEK AVIVA PLUS test strip USE AS DIRECTED TO CHECK BLOOD SUGAR QID  . acetaminophen (TYLENOL) 500 MG tablet Take 1,000 mg by mouth every 6 (six) hours as needed.  [name] amiodarone (PACERONE) 200 MG tablet TAKE 1 TABLET BY MOUTH DAILY  . atorvastatin (LIPITOR) 20 MG tablet Take 20 mg by mouth daily.  . BD PEN NEEDLE NANO U/F 32G X 4 MM MISC U UTD QID FOR 30 DAYS  . calcium carbonate (OS-CAL) 600 MG TABS Take 600 mg by mouth daily with breakfast.   . Cyanocobalamin (VITAMIN B 12 PO) Take 1 tablet daily by mouth.  . ferrous gluconate (FERGON) 324 MG tablet Take  325 mg by mouth daily with breakfast.   . furosemide (LASIX) 80 MG tablet Take 1 tablet (80 mg total) by mouth 2 (two) times daily.  [name] gabapentin (NEURONTIN) 300 MG capsule Take 300 mg by mouth at bedtime.   [name] HUMALOG KWIKPEN 200 UNIT/ML SOPN Inject into the skin 3 (three) times daily. AS DIRECTED  . LEVEMIR FLEXTOUCH 100 UNIT/ML Pen Inject 12 units in the am and 12 units at night into skin as directed  . losartan (COZAAR) 100 MG tablet TAKE 1/2 TABLET(50 MG) BY MOUTH DAILY  . magnesium oxide (MAG-OX) 400 MG tablet Take 400 mg by mouth daily.  . metoprolol succinate (TOPROL-XL) 25 MG 24 hr tablet Take 1 tablet (25 mg total) by mouth daily. Take with or immediately following a meal.  . omeprazole (PRILOSEC) 40 MG capsule Take 40 mg by mouth daily.  [name] oxybutynin (DITROPAN) 5 MG tablet Take 5 mg by mouth 2 (two) times daily.  . potassium chloride (MICRO-K) 10 MEQ CR capsule Take 4 tablets by mouth X's 1 day then go back to 2 tablets by mouth daily  . Vitamin D, Ergocalciferol, (DRISDOL) 50000 UNITS CAPS Take 5,000 Units by mouth 2 (two) times a week.   . warfarin (COUMADIN) 7.5 MG tablet Take 7.5 mg by mouth daily. Takes 7.5 mg Monday through Friday, and a half tablet Saturday Sunday.  . [DISCONTINUED] furosemide (LASIX) 80 MG tablet TAKE 1 TABLET BY MOUTH TWICE DAILY  . [DISCONTINUED] metoprolol succinate (TOPROL-XL) 50 MG 24 hr tablet Take 1 tablet (50 mg total) by mouth daily. Take with or immediately following a meal.     Allergies:   Aspirin and Penicillins   Social History   Socioeconomic History  . Marital status: Widowed    Spouse name: Not on file  . Number of children: Not on file  . Years of education: Not on file  . Highest education level: Not on file  Occupational History  . Not on file  Social Needs  . Financial resource strain: Not on file  . Food insecurity:    Worry: Not on file    Inability: Not on file  . Transportation needs:    Medical: No    Non-medical: No    Tobacco Use  . Smoking status: Never Smoker  . Smokeless tobacco: Never Used  Substance and Sexual Activity  . Alcohol use: No  . Drug use: No  . Sexual activity: Not on file  Lifestyle  . Physical activity:    Days per week: Not on file    Minutes per session: Not on file  . Stress: Not on file  Relationships  . Social connections:    Talks on phone: Not on file    Gets  together: Not on file    Attends religious service: Not on file    Active member of club or organization: Not on file    Attends meetings of clubs or organizations: Not on file    Relationship status: Not on file  Other Topics Concern  . Not on file  Social History Narrative   Lives at home alone   She has a nursing aid who comes to her home for 2.5 hrs/day x 4 days/week    Right handed   1 cup caffeine daily     Family History: The patient's family history includes Cancer in her father; Heart attack in her brother; Hypertension in her brother, daughter, and sister; Stroke in her mother and sister. ROS:   Please see the history of present illness.     All other systems reviewed and are negative.  EKGs/Labs/Other Studies Reviewed:    The following studies were reviewed today:  Echocardiogram 10/12/2017 Study Conclusions - Left ventricle: The cavity size was normal. Wall thickness was   increased in a pattern of mild LVH. There was moderate focal   basal hypertrophy of the septum. Systolic function was normal.   The estimated ejection fraction was in the range of 50% to 55%.   Wall motion was normal; there were no regional wall motion   abnormalities. The study is not technically sufficient to allow   evaluation of LV diastolic function. - Ventricular septum: Septal motion showed abnormal function and   dyssynergy. - Mitral valve: Calcified annulus. Mildly thickened leaflets .   There was mild regurgitation. - Left atrium: The atrium was severely dilated. - Atrial septum: The septum bowed from  left to right, consistent   with increased left atrial pressure. - Pulmonary arteries: Systolic pressure was severely increased.  Impressions: - Low normal to mild global reduction in LV systolic function (EF   50); mild LVH with proximal septal thickening; mild MR; severe   LAE; mild TR with severe pulmonary hypertension.   Carotid Dopplers 09/29/2017 -Right Carotid: There is evidence in the right ICA of a 1-39% stenosis. -Left Carotid: There is evidence in the left ICA of a 1-39% stenosis. -Vertebrals: Both vertebral arteries were patent with antegrade flow. -Subclavians: Normal flow hemodynamics were seen in bilateral subclavian arteries.  EKG:  EKG is ordered today.  The ekg ordered today demonstrates sinus bradycardia, 53 bpm, LBBB.   Recent Labs: No results found for requested labs within last 8760 hours.   Recent Lipid Panel    Component Value Date/Time   CHOL (H) 12/11/2008 0130    202        ATP III CLASSIFICATION:  <200     mg/dL   Desirable  200-239  mg/dL   Borderline High  >=240    mg/dL   High          TRIG 64 12/11/2008 0130   HDL 78 12/11/2008 0130   CHOLHDL 2.6 12/11/2008 0130   VLDL 13 12/11/2008 0130   LDLCALC (H) 12/11/2008 0130    111        Total Cholesterol/HDL:CHD Risk Coronary Heart Disease Risk Table                     Men   Women  1/2 Average Risk   3.4   3.3  Average Risk       5.0   4.4  2 X Average Risk   9.6   7.1  3 X Average Risk  23.4   11.0        Use the calculated Patient Ratio above and the CHD Risk Table to determine the patient's CHD Risk.        ATP III CLASSIFICATION (LDL):  <100     mg/dL   Optimal  100-129  mg/dL   Near or Above                    Optimal  130-159  mg/dL   Borderline  160-189  mg/dL   High  >190     mg/dL   Very High    Physical Exam:    VS:  BP (!) 156/82   Pulse (!) 53   Ht 5\' 5"  (1.651 m)   Wt 202 lb (91.6 kg)   SpO2 99%   BMI 33.61 kg/m     Wt Readings from Last 3 Encounters:   04/04/18 202 lb (91.6 kg)  09/05/17 199 lb 3.2 oz (90.4 kg)  08/29/17 201 lb (91.2 kg)     Physical Exam  Constitutional: She is oriented to person, place, and time. She appears well-developed and well-nourished. No distress.  HENT:  Head: Normocephalic and atraumatic.  Neck: Normal range of motion. Neck supple. No JVD present.  Cardiovascular: Normal rate, regular rhythm, normal heart sounds and intact distal pulses. Exam reveals no gallop and no friction rub.  No murmur heard. Pulmonary/Chest: Effort normal and breath sounds normal. No respiratory distress. She has no wheezes. She has no rales.  Abdominal: Soft. Bowel sounds are normal.  Musculoskeletal: Normal range of motion. She exhibits edema.  1+ lower leg edema  Neurological: She is alert and oriented to person, place, and time.  Skin: Skin is warm and dry.  Psychiatric: She has a normal mood and affect. Her behavior is normal. Thought content normal.  Vitals reviewed.    ASSESSMENT:    1. Chronic diastolic heart failure ([place])   2. Hypertensive heart disease with heart failure ([place])   3. Dyspnea on exertion    PLAN:    In order of problems listed above:  DOE and occ mild chest discomfort: No previous ischemic testing.  Will check lexiscan myoveiw. Will also decrease metoprolol as pt is on amiodarone for control SVT and HR is in the 50's.   Chronic diastolic heart failure: EF 50-55%, unable to assess diastolic function by echo in 09/2017. Pt with increased lower leg edema. Wt unchanged. DOE. Will check pro-BNP. Continue Lasix.  Elevate legs. Limit salt intake.   Hypertensive heart disease: BP is slightly elevated but do not want to add additional BP med at this time. losartan 50 mg daily, Toprol-XL 50 mg daily. I am decreasing metoprolol. Consider increasing her ARB if BP increases.   Chronic anticoagulation: on warfarin for history of DVT. No unusual bleeding  SVT: On amiodarone. No complaints of palpitations.    Dyslipidemia: On atorvastatin 20 mg daily. Followed by PCP.    Medication Adjustments/Labs and Tests Ordered: Current medicines are reviewed at length with the patient today.  Concerns regarding medicines are outlined above. Labs and tests ordered and medication changes are outlined in the patient instructions below:  Patient Instructions  Medication Instructions: Your physician has recommended you make the following change in your medication: DECREASE: Metoprolol to 25 mg taking 1 tablet daily   Labwork: TODAY: BNP  Procedures/Testing: Your physician has requested that you have a lexiscan myoview. For further information please visit [email or web address]. Please follow instruction sheet, as given.  Follow-Up: Your physician recommends that you schedule a follow-up appointment in: 3 months with Dr.Varansi    Any Additional Special Instructions Will Be Listed Below (If Applicable).   Low-Sodium Eating Plan Sodium, which is an element that makes up salt, helps you maintain a healthy balance of fluids in your body. Too much sodium can increase your blood pressure and cause fluid and waste to be held in your body. Your health care provider or dietitian may recommend following this plan if you have high blood pressure (hypertension), kidney disease, liver disease, or heart failure. Eating less sodium can help lower your blood pressure, reduce swelling, and protect your heart, liver, and kidneys. What are tips for following this plan? General guidelines  Most people on this plan should limit their sodium intake to 1,500-2,000 mg (milligrams) of sodium each day. Reading food labels  The Nutrition Facts label lists the amount of sodium in one serving of the food. If you eat more than one serving, you must multiply the listed amount of sodium by the number of servings.  Choose foods with less than 140 mg of sodium per serving.  Avoid foods with 300 mg of sodium or more per  serving. Shopping  Look for lower-sodium products, often labeled as "low-sodium" or "no salt added."  Always check the sodium content even if foods are labeled as "unsalted" or "no salt added".  Buy fresh foods. ? Avoid canned foods and premade or frozen meals. ? Avoid canned, cured, or processed meats  Buy breads that have less than 80 mg of sodium per slice. Cooking  Eat more home-cooked food and less restaurant, buffet, and fast food.  Avoid adding salt when cooking. Use salt-free seasonings or herbs instead of table salt or sea salt. Check with your health care provider or pharmacist before using salt substitutes.  Cook with plant-based oils, such as canola, sunflower, or olive oil. Meal planning  When eating at a restaurant, ask that your food be prepared with less salt or no salt, if possible.  Avoid foods that contain MSG (monosodium glutamate). MSG is sometimes added to [place] food, bouillon, and some canned foods. What foods are recommended? The items listed may not be a complete list. Talk with your dietitian about what dietary choices are best for you. Grains Low-sodium cereals, including oats, puffed wheat and rice, and shredded wheat. Low-sodium crackers. Unsalted rice. Unsalted pasta. Low-sodium bread. Whole-grain breads and whole-grain pasta. Vegetables Fresh or frozen vegetables. "No salt added" canned vegetables. "No salt added" tomato sauce and paste. Low-sodium or reduced-sodium tomato and vegetable juice. Fruits Fresh, frozen, or canned fruit. Fruit juice. Meats and other protein foods Fresh or frozen (no salt added) meat, poultry, seafood, and fish. Low-sodium canned tuna and salmon. Unsalted nuts. Dried peas, beans, and lentils without added salt. Unsalted canned beans. Eggs. Unsalted nut butters. Dairy Milk. Soy milk. Cheese that is naturally low in sodium, such as ricotta cheese, fresh mozzarella, or Swiss cheese Low-sodium or reduced-sodium cheese. Cream  cheese. Yogurt. Fats and oils Unsalted butter. Unsalted margarine with no trans fat. Vegetable oils such as canola or olive oils. Seasonings and other foods Fresh and dried herbs and spices. Salt-free seasonings. Low-sodium mustard and ketchup. Sodium-free salad dressing. Sodium-free light mayonnaise. Fresh or refrigerated horseradish. Lemon juice. Vinegar. Homemade, reduced-sodium, or low-sodium soups. Unsalted popcorn and pretzels. Low-salt or salt-free chips. What foods are not recommended? The items listed may not be a complete list. Talk with your dietitian about what dietary choices are  best for you. Grains Instant hot cereals. Bread stuffing, pancake, and biscuit mixes. Croutons. Seasoned rice or pasta mixes. Noodle soup cups. Boxed or frozen macaroni and cheese. Regular salted crackers. Self-rising flour. Vegetables Sauerkraut, pickled vegetables, and relishes. Olives. [place] fries. Onion rings. Regular canned vegetables (not low-sodium or reduced-sodium). Regular canned tomato sauce and paste (not low-sodium or reduced-sodium). Regular tomato and vegetable juice (not low-sodium or reduced-sodium). Frozen vegetables in sauces. Meats and other protein foods Meat or fish that is salted, canned, smoked, spiced, or pickled. Bacon, ham, sausage, hotdogs, corned beef, chipped beef, packaged lunch meats, salt pork, jerky, pickled herring, anchovies, regular canned tuna, sardines, salted nuts. Dairy Processed cheese and cheese spreads. Cheese curds. Blue cheese. Feta cheese. String cheese. Regular cottage cheese. Buttermilk. Canned milk. Fats and oils Salted butter. Regular margarine. Ghee. Bacon fat. Seasonings and other foods Onion salt, garlic salt, seasoned salt, table salt, and sea salt. Canned and packaged gravies. Worcestershire sauce. Tartar sauce. Barbecue sauce. Teriyaki sauce. Soy sauce, including reduced-sodium. Steak sauce. Fish sauce. Oyster sauce. Cocktail sauce. Horseradish that you  find on the shelf. Regular ketchup and mustard. Meat flavorings and tenderizers. Bouillon cubes. Hot sauce and Tabasco sauce. Premade or packaged marinades. Premade or packaged taco seasonings. Relishes. Regular salad dressings. Salsa. Potato and tortilla chips. Corn chips and puffs. Salted popcorn and pretzels. Canned or dried soups. Pizza. Frozen entrees and pot pies. Summary  Eating less sodium can help lower your blood pressure, reduce swelling, and protect your heart, liver, and kidneys.  Most people on this plan should limit their sodium intake to 1,500-2,000 mg (milligrams) of sodium each day.  Canned, boxed, and frozen foods are high in sodium. Restaurant foods, fast foods, and pizza are also very high in sodium. You also get sodium by adding salt to food.  Try to cook at home, eat more fresh fruits and vegetables, and eat less fast food, canned, processed, or prepared foods. This information is not intended to replace advice given to you by your health care provider. Make sure you discuss any questions you have with your health care provider. Document Released: 04/01/2002 Document Revised: 10/03/2016 Document Reviewed: 10/03/2016 Elsevier Interactive Patient Education  [employer].    If you need a refill on your cardiac medications before your next appointment, please call your pharmacy.      Signed, Daune Perch, NP  04/04/2018 6:10 PM    [facility]

## 2018-04-04 NOTE — Patient Instructions (Signed)
Medication Instructions: Your physician has recommended you make the following change in your medication: DECREASE: Metoprolol to 25 mg taking 1 tablet daily   Labwork: TODAY: BNP  Procedures/Testing: Your physician has requested that you have a lexiscan myoview. For further information please visit [email or web address]. Please follow instruction sheet, as given.    Follow-Up: Your physician recommends that you schedule a follow-up appointment in: 3 months with Dr.Varansi    Any Additional Special Instructions Will Be Listed Below (If Applicable).   Low-Sodium Eating Plan Sodium, which is an element that makes up salt, helps you maintain a healthy balance of fluids in your body. Too much sodium can increase your blood pressure and cause fluid and waste to be held in your body. Your health care provider or dietitian may recommend following this plan if you have high blood pressure (hypertension), kidney disease, liver disease, or heart failure. Eating less sodium can help lower your blood pressure, reduce swelling, and protect your heart, liver, and kidneys. What are tips for following this plan? General guidelines  Most people on this plan should limit their sodium intake to 1,500-2,000 mg (milligrams) of sodium each day. Reading food labels  The Nutrition Facts label lists the amount of sodium in one serving of the food. If you eat more than one serving, you must multiply the listed amount of sodium by the number of servings.  Choose foods with less than 140 mg of sodium per serving.  Avoid foods with 300 mg of sodium or more per serving. Shopping  Look for lower-sodium products, often labeled as "low-sodium" or "no salt added."  Always check the sodium content even if foods are labeled as "unsalted" or "no salt added".  Buy fresh foods. ? Avoid canned foods and premade or frozen meals. ? Avoid canned, cured, or processed meats  Buy breads that have less than 80 mg of  sodium per slice. Cooking  Eat more home-cooked food and less restaurant, buffet, and fast food.  Avoid adding salt when cooking. Use salt-free seasonings or herbs instead of table salt or sea salt. Check with your health care provider or pharmacist before using salt substitutes.  Cook with plant-based oils, such as canola, sunflower, or olive oil. Meal planning  When eating at a restaurant, ask that your food be prepared with less salt or no salt, if possible.  Avoid foods that contain MSG (monosodium glutamate). MSG is sometimes added to [place] food, bouillon, and some canned foods. What foods are recommended? The items listed may not be a complete list. Talk with your dietitian about what dietary choices are best for you. Grains Low-sodium cereals, including oats, puffed wheat and rice, and shredded wheat. Low-sodium crackers. Unsalted rice. Unsalted pasta. Low-sodium bread. Whole-grain breads and whole-grain pasta. Vegetables Fresh or frozen vegetables. "No salt added" canned vegetables. "No salt added" tomato sauce and paste. Low-sodium or reduced-sodium tomato and vegetable juice. Fruits Fresh, frozen, or canned fruit. Fruit juice. Meats and other protein foods Fresh or frozen (no salt added) meat, poultry, seafood, and fish. Low-sodium canned tuna and salmon. Unsalted nuts. Dried peas, beans, and lentils without added salt. Unsalted canned beans. Eggs. Unsalted nut butters. Dairy Milk. Soy milk. Cheese that is naturally low in sodium, such as ricotta cheese, fresh mozzarella, or Swiss cheese Low-sodium or reduced-sodium cheese. Cream cheese. Yogurt. Fats and oils Unsalted butter. Unsalted margarine with no trans fat. Vegetable oils such as canola or olive oils. Seasonings and other foods Fresh and dried herbs and spices. Salt-free  seasonings. Low-sodium mustard and ketchup. Sodium-free salad dressing. Sodium-free light mayonnaise. Fresh or refrigerated horseradish. Lemon juice.  Vinegar. Homemade, reduced-sodium, or low-sodium soups. Unsalted popcorn and pretzels. Low-salt or salt-free chips. What foods are not recommended? The items listed may not be a complete list. Talk with your dietitian about what dietary choices are best for you. Grains Instant hot cereals. Bread stuffing, pancake, and biscuit mixes. Croutons. Seasoned rice or pasta mixes. Noodle soup cups. Boxed or frozen macaroni and cheese. Regular salted crackers. Self-rising flour. Vegetables Sauerkraut, pickled vegetables, and relishes. Olives. [place] fries. Onion rings. Regular canned vegetables (not low-sodium or reduced-sodium). Regular canned tomato sauce and paste (not low-sodium or reduced-sodium). Regular tomato and vegetable juice (not low-sodium or reduced-sodium). Frozen vegetables in sauces. Meats and other protein foods Meat or fish that is salted, canned, smoked, spiced, or pickled. Bacon, ham, sausage, hotdogs, corned beef, chipped beef, packaged lunch meats, salt pork, jerky, pickled herring, anchovies, regular canned tuna, sardines, salted nuts. Dairy Processed cheese and cheese spreads. Cheese curds. Blue cheese. Feta cheese. String cheese. Regular cottage cheese. Buttermilk. Canned milk. Fats and oils Salted butter. Regular margarine. Ghee. Bacon fat. Seasonings and other foods Onion salt, garlic salt, seasoned salt, table salt, and sea salt. Canned and packaged gravies. Worcestershire sauce. Tartar sauce. Barbecue sauce. Teriyaki sauce. Soy sauce, including reduced-sodium. Steak sauce. Fish sauce. Oyster sauce. Cocktail sauce. Horseradish that you find on the shelf. Regular ketchup and mustard. Meat flavorings and tenderizers. Bouillon cubes. Hot sauce and Tabasco sauce. Premade or packaged marinades. Premade or packaged taco seasonings. Relishes. Regular salad dressings. Salsa. Potato and tortilla chips. Corn chips and puffs. Salted popcorn and pretzels. Canned or dried soups. Pizza. Frozen  entrees and pot pies. Summary  Eating less sodium can help lower your blood pressure, reduce swelling, and protect your heart, liver, and kidneys.  Most people on this plan should limit their sodium intake to 1,500-2,000 mg (milligrams) of sodium each day.  Canned, boxed, and frozen foods are high in sodium. Restaurant foods, fast foods, and pizza are also very high in sodium. You also get sodium by adding salt to food.  Try to cook at home, eat more fresh fruits and vegetables, and eat less fast food, canned, processed, or prepared foods. This information is not intended to replace advice given to you by your health care provider. Make sure you discuss any questions you have with your health care provider. Document Released: 04/01/2002 Document Revised: 10/03/2016 Document Reviewed: 10/03/2016 Elsevier Interactive Patient Education  [employer].    If you need a refill on your cardiac medications before your next appointment, please call your pharmacy.

## 2018-04-06 DIAGNOSIS — Z7901 Long term (current) use of anticoagulants: Secondary | ICD-10-CM | POA: Diagnosis not present

## 2018-04-09 ENCOUNTER — Other Ambulatory Visit: Payer: Self-pay | Admitting: *Deleted

## 2018-04-09 NOTE — Patient Outreach (Signed)
[facility]) Care Management  04/09/2018  TRITIA ENDO 05-17-1933 [id number]   RN Health Coach Monthly Outreach  Referral Date:11/22/2016 Referral Source:THN Screening Reason for Referral:Disease Management/Education Insurance:[employer] Medicare   Outreach Attempt:  Outreach attempt #1 to patient for monthly follow up. No answer. RN Health Coach left HIPAA compliant voicemail message along with contact information.  Plan:  RN Health Coach will send unsuccessful outreach letter to patient.  RN Health Coach will make another outreach attempt to patient within 3-4 business days if no return call back from patient.  [place] [phone] [email or web address]

## 2018-04-10 ENCOUNTER — Telehealth (HOSPITAL_COMMUNITY): Payer: Self-pay | Admitting: *Deleted

## 2018-04-10 LAB — PRO B NATRIURETIC PEPTIDE: NT-Pro BNP: 1189 pg/mL — ABNORMAL HIGH (ref 0–738)

## 2018-04-10 NOTE — Telephone Encounter (Signed)
Left message on voicemail per DPR in reference to upcoming appointment scheduled on 04/13/18 at 1015 with detailed instructions given per Myocardial Perfusion Study Information Sheet for the test. LM to arrive 15 minutes early, and that it is imperative to arrive on time for appointment to keep from having the test rescheduled. If you need to cancel or reschedule your appointment, please call the office within 24 hours of your appointment. Failure to do so may result in a cancellation of your appointment, and a $50 no show fee. Phone number given for call back for any questions. Mayer Vondrak, Ranae [place]

## 2018-04-11 ENCOUNTER — Other Ambulatory Visit: Payer: Self-pay | Admitting: *Deleted

## 2018-04-11 NOTE — Patient Outreach (Signed)
[facility]) Care Management  04/11/2018  [name] 06/20/1933 [id number]   RN Health Coach Monthly Outreach  Referral Date:11/22/2016 Referral Source:THN Screening Reason for Referral:Disease Management/Education Insurance:[employer] Medicare   Outreach Attempt:  Outreach attempt #2 to patient for monthly follow up.  Patient answered.  HIPAA verified with patient.  Patient stating she is just starting to eat breakfast and is requesting a telephone call back later.  Plan:  RN Health Coach will attempt another telephone outreach to patient within the next 4 business days.  [place] [phone] [name]

## 2018-04-12 ENCOUNTER — Encounter: Payer: Self-pay | Admitting: *Deleted

## 2018-04-12 ENCOUNTER — Other Ambulatory Visit: Payer: Self-pay | Admitting: *Deleted

## 2018-04-12 NOTE — Patient Outreach (Signed)
[facility]) Care Management  [place]  04/12/2018   [name] 06-Aug-1933 [id number]   RN Health Coach Monthly Outreach   Referral Date: 11/22/2016 Referral Source: [facility] Screening Reason for Referral: Disease Management/Education Insurance: [employer]   Outreach Attempt:   Successful telephone outreach to patient for monthly follow up.  HIPAA verified with patient.  Patient stating she continues to have pain in her leg and discoloration.  Verbalizes she is due to have a stress test tomorrow.  Also reports a fall within the last few weeks, stating her foot got caught in the rug.  Denies any injuries, just soreness.  Continues to weigh daily.  Reports physician called and instructed her to double her morning dose of Lasix for the next 3 days.  Weight this morning decreased to 196.2 pounds, normally ranged 198-199.  Reports reduced swelling in her ankles.  CBG this morning was 150 with recent fasting ranges of 120-140's.  Encounter Medications:  Outpatient Encounter Medications as of 04/12/2018  Medication Sig Note  . ACCU-CHEK AVIVA PLUS test strip USE AS DIRECTED TO CHECK BLOOD SUGAR QID   . acetaminophen (TYLENOL) 500 MG tablet Take 1,000 mg by mouth every 6 (six) hours as needed.   [name] amiodarone (PACERONE) 200 MG tablet TAKE 1 TABLET BY MOUTH DAILY   . atorvastatin (LIPITOR) 20 MG tablet Take 20 mg by mouth daily.   . BD PEN NEEDLE NANO U/F 32G X 4 MM MISC U UTD QID FOR 30 DAYS   . calcium carbonate (OS-CAL) 600 MG TABS Take 600 mg by mouth daily with breakfast.    . Cyanocobalamin (VITAMIN B 12 PO) Take 1 tablet daily by mouth.   . ferrous gluconate (FERGON) 324 MG tablet Take 325 mg by mouth daily with breakfast.    . furosemide (LASIX) 80 MG tablet Take 1 tablet (80 mg total) by mouth 2 (two) times daily.   [name] gabapentin (NEURONTIN) 300 MG capsule Take 300 mg by mouth at bedtime.    [name] HUMALOG KWIKPEN 200 UNIT/ML SOPN Inject into the skin 3  (three) times daily. AS DIRECTED 01/11/2018: Sliding Scale 100-150 mg/dL take 4 units; 151-200 mg/dL take 5 units;201-260 mg/dL take 6 units  . LEVEMIR FLEXTOUCH 100 UNIT/ML Pen Inject 12 units in the am and 12 units at night into skin as directed 10/06/2017: Patient reports insulin was adjusted to 10 units at bedtime and 14 units every am  . losartan (COZAAR) 100 MG tablet TAKE 1/2 TABLET(50 MG) BY MOUTH DAILY   . magnesium oxide (MAG-OX) 400 MG tablet Take 400 mg by mouth daily.   . metoprolol succinate (TOPROL-XL) 25 MG 24 hr tablet Take 1 tablet (25 mg total) by mouth daily. Take with or immediately following a meal.   . omeprazole (PRILOSEC) 40 MG capsule Take 40 mg by mouth daily.   [name] oxybutynin (DITROPAN) 5 MG tablet Take 5 mg by mouth 2 (two) times daily.   . potassium chloride (MICRO-K) 10 MEQ CR capsule Take 4 tablets by mouth X's 1 day then go back to 2 tablets by mouth daily   . Vitamin D, Ergocalciferol, (DRISDOL) 50000 UNITS CAPS Take 5,000 Units by mouth 2 (two) times a week.  01/11/2018: Uses OTC product, Vitamin D3, daily instead  . warfarin (COUMADIN) 7.5 MG tablet Take 7.5 mg by mouth daily. Takes 7.5 mg Monday through Friday, and a half tablet Saturday Sunday. 01/11/2018: Uses Brand name   No facility-administered encounter medications on  file as of 04/12/2018.     Functional Status:  In your present state of health, do you have any difficulty performing the following activities: 01/15/2018 11/03/2017  Hearing? N N  Vision? N N  Difficulty concentrating or making decisions? N N  Walking or climbing stairs? Y Y  Dressing or bathing? Y Y  Comment - gets assistance with baths  Doing errands, shopping? Tempie Donning  Preparing Food and eating ? Y Y  Using the Toilet? N Y  In the past six months, have you accidently leaked urine? N Y  Comment - -  Do you have problems with loss of bowel control? N N  Managing your Medications? Y Y  Comment - aide assist with medications  Managing your  Finances? Y N  Housekeeping or managing your Housekeeping? Y Y  Some recent data might be hidden    Fall/Depression Screening: Fall Risk  04/12/2018 02/06/2018 01/15/2018  Falls in the past year? Yes No No  Comment Fall a few weeks ago denies any recent falls -  Number falls in past yr: 1 - -  Injury with Fall? No - -  Risk for fall due to : Impaired balance/gait;Impaired mobility;History of fall(s);Impaired vision - -  Follow up Falls evaluation completed - -   PHQ 2/9 Scores 01/15/2018 09/01/2017 08/10/2017 05/19/2017 11/29/2016 11/22/2016  PHQ - 2 Score 0 0 0 0 0 0    THN CM Care Plan Problem One     Most Recent Value  Care Plan Problem One  Knowledge deficiet related to importance of medical follow up appointments and self care management of diabetes and congestive heart failure.  Role Documenting the Problem One  [place] for Problem One  Active  [facility] Long Term Goal   Patient will report a decrease in her A1C by 0.5 point in the next 90 days.  THN Long Term Goal Start Date  03/14/18  Interventions for Problem One Long Term Goal  Current care plan and goals reviewed and discussed with patient, encouraged to keep and attend medical appointments, encouraged to discuss leg discoloration with physician, encouraged to continue to monitor blood sugars, reviewed medications and encouraged compliance, discussed healthier lower carbohydrate meal and drink options, discussed and encouraged patient to eat bedtime snack, congratulated patient on reducing her fasting blood sugar ranges  THN CM Short Term Goal #1   Patient will report a decrease in pain and swelling in right leg in the next 30 days.  THN CM Short Term Goal #1 Start Date  04/12/18  Interventions for Short Term Goal #1  discussed with patient the increase in dose of lasix for the next few days, discussed decrease in swelling in the ankles, encouraged patient to discuss pain and swelling and discoloration in the leg with her  physicians, discussed and encouraged lower extremity elevation to help with pain and swelling  THN CM Short Term Goal #2   Patient will contact insurance to verify denture benefit in the next 30 days.  THN CM Short Term Goal #2 Start Date  04/12/18  Interventions for Short Term Goal #2  Encouraged patient to contact insurance company for list of in network dentist, encouraged patient to arrange dental appointment as soon as possible to assist with getting dentures  THN CM Short Term Goal #3  Patient will report no falls in the next 30 days.  (Pended)   THN CM Short Term Goal #3 Start Date  04/12/18  (Pended)  Interventions for Short Tern Goal #3  Falls precautions and preventions discussed and encouraged with patient, encouraged to utilize walker at all times,   (Pended)       Appointments:  Reports next appointment with primary care provider, [name] is 05/08/2018.  Next scheduled appointment with Dr. Chalmers Cater, Endocrinologist is 05/28/2018.  Seen Cardiology on 04/04/2018 and has scheduled stress test for tomorrow, 04/13/2018.  Plan: RN Health Coach will make next monthly outreach to patient in the month of July. RN Health Coach will send primary care provider Quarterly Update.   [place] [phone]

## 2018-04-13 ENCOUNTER — Ambulatory Visit (HOSPITAL_COMMUNITY): Payer: Medicare Other | Attending: Cardiology

## 2018-04-13 ENCOUNTER — Telehealth: Payer: Self-pay | Admitting: Interventional Cardiology

## 2018-04-13 DIAGNOSIS — R0609 Other forms of dyspnea: Secondary | ICD-10-CM | POA: Diagnosis not present

## 2018-04-13 DIAGNOSIS — M79605 Pain in left leg: Principal | ICD-10-CM

## 2018-04-13 DIAGNOSIS — R0600 Dyspnea, unspecified: Secondary | ICD-10-CM

## 2018-04-13 DIAGNOSIS — M79604 Pain in right leg: Secondary | ICD-10-CM

## 2018-04-13 LAB — MYOCARDIAL PERFUSION IMAGING
CHL CUP NUCLEAR SDS: 2
CHL CUP NUCLEAR SRS: 12
CHL CUP NUCLEAR SSS: 14
LHR: 0.29
LV dias vol: 124 mL (ref 46–106)
LV sys vol: 62 mL
Peak HR: 90 {beats}/min
Rest HR: 65 {beats}/min
TID: 0.98

## 2018-04-13 MED ORDER — REGADENOSON 0.4 MG/5ML IV SOLN
0.4000 mg | Freq: Once | INTRAVENOUS | Status: AC
  Administered 2018-04-13: 0.4 mg via INTRAVENOUS

## 2018-04-13 MED ORDER — TECHNETIUM TC 99M TETROFOSMIN IV KIT
32.6000 | PACK | Freq: Once | INTRAVENOUS | Status: AC | PRN
  Administered 2018-04-13: 32.6 via INTRAVENOUS
  Filled 2018-04-13: qty 33

## 2018-04-13 MED ORDER — TECHNETIUM TC 99M TETROFOSMIN IV KIT
10.1000 | PACK | Freq: Once | INTRAVENOUS | Status: AC | PRN
  Administered 2018-04-13: 10.1 via INTRAVENOUS
  Filled 2018-04-13: qty 11

## 2018-04-13 NOTE — Telephone Encounter (Signed)
Returned call to daughter who is requesting a referral be placed to [facility] for the patient's swelling. Daughter states that the patient's feet and legs are swollen and are dark and red. She states that the patient has diabetes and is already seeing an endocrinologist. Daughter is concerned that if she does not see a specialist about her legs that they are going to get worse. Made daughter aware that I would forward the information.

## 2018-04-13 NOTE — Telephone Encounter (Signed)
New Message:       Pt's daughter calling and states he mother needs a referral sent to [facility]

## 2018-04-16 ENCOUNTER — Ambulatory Visit: Payer: Medicare Other | Admitting: Interventional Cardiology

## 2018-04-18 ENCOUNTER — Encounter (HOSPITAL_COMMUNITY): Payer: Medicare Other

## 2018-04-18 NOTE — Telephone Encounter (Signed)
Returned call to daughter and made her aware that Dr. Irish Lack would like to start with a lower extremity arterial doppler first before putting in a referral. Daughter verbalized understanding. LE Art Doppler order placed. Daughter aware that she will be called to schedule appointment.

## 2018-04-18 NOTE — Telephone Encounter (Signed)
Discussed with Dr. Irish Lack and we will start with a lower extremity arterial doppler first before putting in a referral.  Called to make daughter aware, but there was no answer. Left message for daughter to call back.

## 2018-04-18 NOTE — Telephone Encounter (Signed)
Follow up    Patients daughter is returning call in reference to referral to [facility]. Please call.

## 2018-04-20 ENCOUNTER — Other Ambulatory Visit: Payer: Self-pay | Admitting: Interventional Cardiology

## 2018-04-20 DIAGNOSIS — M79604 Pain in right leg: Secondary | ICD-10-CM

## 2018-04-20 DIAGNOSIS — M79605 Pain in left leg: Principal | ICD-10-CM

## 2018-04-20 DIAGNOSIS — L819 Disorder of pigmentation, unspecified: Secondary | ICD-10-CM

## 2018-04-23 ENCOUNTER — Ambulatory Visit (HOSPITAL_COMMUNITY)
Admission: RE | Admit: 2018-04-23 | Discharge: 2018-04-23 | Disposition: A | Discharge status: Home / Self Care | Payer: Medicare Other | Source: Ambulatory Visit | Attending: Cardiovascular Disease | Admitting: Cardiovascular Disease

## 2018-04-23 DIAGNOSIS — I10 Essential (primary) hypertension: Secondary | ICD-10-CM | POA: Diagnosis not present

## 2018-04-23 DIAGNOSIS — M79605 Pain in left leg: Secondary | ICD-10-CM

## 2018-04-23 DIAGNOSIS — L819 Disorder of pigmentation, unspecified: Secondary | ICD-10-CM

## 2018-04-23 DIAGNOSIS — E119 Type 2 diabetes mellitus without complications: Secondary | ICD-10-CM | POA: Diagnosis not present

## 2018-04-23 DIAGNOSIS — M79604 Pain in right leg: Secondary | ICD-10-CM | POA: Diagnosis not present

## 2018-05-09 ENCOUNTER — Other Ambulatory Visit: Payer: Self-pay | Admitting: *Deleted

## 2018-05-09 NOTE — Patient Outreach (Addendum)
[facility]) Care Management  05/09/2018  TIMAYA BOJARSKI [birth year] [id number]   RN Health Coach Monthly Outreach  Referral Date:11/22/2016 Referral Source:THN Screening Reason for Referral:Disease Management/Education Insurance:[employer] Medicare   Outreach Attempt:  Outreach attempt #1 to patient for monthly follow up. No answer. RN Health Coach unable to leave voice message due to voice mail not engaging.  Plan:  RN Health Coach will send unsuccessful outreach letter to patient.  RN Health Coach will make another outreach attempt to patient within 3-4 business days if no return call back from patient.  [place] [phone] [email or web address]

## 2018-05-12 ENCOUNTER — Emergency Department (HOSPITAL_BASED_OUTPATIENT_CLINIC_OR_DEPARTMENT_OTHER)
Admission: EM | Admit: 2018-05-12 | Discharge: 2018-05-12 | Disposition: A | Discharge status: Home / Self Care | Payer: Medicare Other | Attending: Emergency Medicine | Admitting: Emergency Medicine

## 2018-05-12 ENCOUNTER — Encounter (HOSPITAL_BASED_OUTPATIENT_CLINIC_OR_DEPARTMENT_OTHER): Payer: Self-pay | Admitting: *Deleted

## 2018-05-12 ENCOUNTER — Other Ambulatory Visit: Payer: Self-pay

## 2018-05-12 DIAGNOSIS — R1033 Periumbilical pain: Secondary | ICD-10-CM | POA: Diagnosis not present

## 2018-05-12 DIAGNOSIS — I130 Hypertensive heart and chronic kidney disease with heart failure and stage 1 through stage 4 chronic kidney disease, or unspecified chronic kidney disease: Secondary | ICD-10-CM | POA: Diagnosis not present

## 2018-05-12 DIAGNOSIS — Z79899 Other long term (current) drug therapy: Secondary | ICD-10-CM | POA: Insufficient documentation

## 2018-05-12 DIAGNOSIS — H1132 Conjunctival hemorrhage, left eye: Secondary | ICD-10-CM

## 2018-05-12 DIAGNOSIS — Z7901 Long term (current) use of anticoagulants: Secondary | ICD-10-CM | POA: Diagnosis not present

## 2018-05-12 DIAGNOSIS — N183 Chronic kidney disease, stage 3 (moderate): Secondary | ICD-10-CM | POA: Insufficient documentation

## 2018-05-12 DIAGNOSIS — H5789 Other specified disorders of eye and adnexa: Secondary | ICD-10-CM | POA: Diagnosis present

## 2018-05-12 DIAGNOSIS — E1122 Type 2 diabetes mellitus with diabetic chronic kidney disease: Secondary | ICD-10-CM | POA: Insufficient documentation

## 2018-05-12 DIAGNOSIS — I5032 Chronic diastolic (congestive) heart failure: Secondary | ICD-10-CM | POA: Insufficient documentation

## 2018-05-12 LAB — COMPREHENSIVE METABOLIC PANEL
ALBUMIN: 3.7 g/dL (ref 3.5–5.0)
ALK PHOS: 90 U/L (ref 38–126)
ALT: 35 U/L (ref 0–44)
ANION GAP: 9 (ref 5–15)
AST: 33 U/L (ref 15–41)
BILIRUBIN TOTAL: 0.7 mg/dL (ref 0.3–1.2)
BUN: 24 mg/dL — ABNORMAL HIGH (ref 8–23)
CALCIUM: 8.8 mg/dL — AB (ref 8.9–10.3)
CO2: 27 mmol/L (ref 22–32)
Chloride: 106 mmol/L (ref 98–111)
Creatinine, Ser: 1.2 mg/dL — ABNORMAL HIGH (ref 0.44–1.00)
GFR calc non Af Amer: 41 mL/min — ABNORMAL LOW (ref >60)
GFR, EST AFRICAN AMERICAN: 47 mL/min — AB (ref >60)
Glucose, Bld: 99 mg/dL (ref 70–99)
Potassium: 3.8 mmol/L (ref 3.5–5.1)
Sodium: 142 mmol/L (ref 135–145)
TOTAL PROTEIN: 8 g/dL (ref 6.5–8.1)

## 2018-05-12 LAB — CBC WITH DIFFERENTIAL/PLATELET
BASOS ABS: 0 10*3/uL (ref 0.0–0.1)
BASOS PCT: 0 %
EOS PCT: 5 %
Eosinophils Absolute: 0.2 10*3/uL (ref 0.0–0.7)
HCT: 35 % — ABNORMAL LOW (ref 36.0–46.0)
Hemoglobin: 11.1 g/dL — ABNORMAL LOW (ref 12.0–15.0)
Lymphocytes Relative: 25 %
Lymphs Abs: 0.9 10*3/uL (ref 0.7–4.0)
MCH: 30.2 pg (ref 26.0–34.0)
MCHC: 31.7 g/dL (ref 30.0–36.0)
MCV: 95.4 fL (ref 78.0–100.0)
MONO ABS: 0.4 10*3/uL (ref 0.1–1.0)
Monocytes Relative: 11 %
Neutro Abs: 2.1 10*3/uL (ref 1.7–7.7)
Neutrophils Relative %: 59 %
PLATELETS: 114 10*3/uL — AB (ref 150–400)
RBC: 3.67 MIL/uL — AB (ref 3.87–5.11)
RDW: 14.8 % (ref 11.5–15.5)
WBC: 3.6 10*3/uL — AB (ref 4.0–10.5)

## 2018-05-12 LAB — URINALYSIS, ROUTINE W REFLEX MICROSCOPIC
Bilirubin Urine: NEGATIVE
GLUCOSE, UA: NEGATIVE mg/dL
Hgb urine dipstick: NEGATIVE
KETONES UR: NEGATIVE mg/dL
LEUKOCYTES UA: NEGATIVE
NITRITE: NEGATIVE
Protein, ur: NEGATIVE mg/dL
Specific Gravity, Urine: <1.005 — ABNORMAL LOW (ref 1.005–1.030)
pH: 6 (ref 5.0–8.0)

## 2018-05-12 LAB — PROTIME-INR
INR: 1.88
PROTHROMBIN TIME: 21.4 s — AB (ref 11.4–15.2)

## 2018-05-12 NOTE — ED Triage Notes (Signed)
Pt reports she noticed her left eye was very red upon waking this am

## 2018-05-12 NOTE — Discharge Instructions (Signed)
Please read and follow all provided instructions.  Your diagnoses today include:  1. Subconjunctival hemorrhage of left eye   2. Periumbilical abdominal pain     Tests performed today include:  Blood counts and electrolytes  Blood tests to check liver and kidney function  Urine test to look for infection and pregnancy (in women)  Vital signs. See below for your results today.   Medications prescribed:   None  Take any prescribed medications only as directed.  Home care instructions:   Follow any educational materials contained in this packet.  Follow-up instructions: Please follow-up with your primary care provider in the next 3 days for further evaluation of your symptoms.    Return instructions:  SEEK IMMEDIATE MEDICAL ATTENTION IF:  The pain does not go away or becomes severe   A temperature above 101F develops   Repeated vomiting occurs (multiple episodes)   The pain becomes localized to portions of the abdomen. The right side could possibly be appendicitis. In an adult, the left lower portion of the abdomen could be colitis or diverticulitis.   Blood is being passed in stools or vomit (bright red or black tarry stools)   You develop chest pain, difficulty breathing, dizziness or fainting, or become confused, poorly responsive, or inconsolable (young children)  If you have any other emergent concerns regarding your health  Additional Information: Abdominal (belly) pain can be caused by many things. Your caregiver performed an examination and possibly ordered blood/urine tests and imaging (CT scan, x-rays, ultrasound). Many cases can be observed and treated at home after initial evaluation in the emergency department. Even though you are being discharged home, abdominal pain can be unpredictable. Therefore, you need a repeated exam if your pain does not resolve, returns, or worsens. Most patients with abdominal pain don't have to be admitted to the hospital or have  surgery, but serious problems like appendicitis and gallbladder attacks can start out as nonspecific pain. Many abdominal conditions cannot be diagnosed in one visit, so follow-up evaluations are very important.  Your vital signs today were: BP (!) 159/78 (BP Location: Right Arm)    Pulse 64    Temp 98.4 F (36.9 C) (Oral)    Resp 20    Wt 91.6 kg (202 lb)    SpO2 99%    BMI 33.61 kg/m  If your blood pressure (bp) was elevated above 135/85 this visit, please have this repeated by your doctor within one month. --------------

## 2018-05-12 NOTE — ED Provider Notes (Signed)
[facility] EMERGENCY DEPARTMENT Provider Note   CSN: [id number] Arrival date & time: 05/12/18  1405     History   Chief Complaint Chief Complaint  Patient presents with  . Eye Problem    HPI [name] is a 84 y.o. female.  Patient with history of DVT, stroke, on Coumadin, diabetes, diastolic heart failure --presents with complaint of red left eye and abdominal pain.  Patient states that her left eye redness started acutely this morning while she was asleep between midnight last night and this morning.  She first noticed it when she woke up.  She does not have any eye pain.  She has some minimal blurriness but no vision loss.  She denies any injuries to the area.  She denies vomiting or coughing.   Patient also reports a nagging intermittent abdominal pain to the left and right of her bellybutton over the past 3 days.  She has not had any associated nausea, vomiting, diarrhea.  No blood in the stool or urinary symptoms.  Pain comes and goes.  She has not done anything for this.     Past Medical History:  Diagnosis Date  . Anemia   . Arthritis   . Cataract   . Chronic diastolic CHF (congestive heart failure) ([place])   . CKD (chronic kidney disease), stage III ([place])   . Clotting disorder ([place])   . Diabetes mellitus ([place])   . Dizziness and giddiness   . DVT (deep venous thrombosis) ([place])    a. On Coumadin for this.  . Essential hypertension   . LBBB (left bundle branch block)   . Mitral regurgitation    a. Mod MR by echo 2014.  . Muscular deconditioning   . Obesity   . SVT (supraventricular tachycardia) ([place])    a. In 2013 she had an EPS with ablation for SVT which did not eliminate the SVT completely. She also had bradycardia which limited medication. She was placed on amiodarone by Dr. Lovena Le.  . Thrombocytopenia ([place]) 11/21/2011    Patient Active Problem List   Diagnosis Date Noted  . Hypertensive heart disease with heart failure ([place]) 09/05/2017  .  Aphasia 08/31/2017  . Dysphagia 08/31/2017  . CKD (chronic kidney disease), stage III ([place])   . LBBB (left bundle branch block)   . Anemia   . HTN (hypertension)   . Obesity   . DVT (deep venous thrombosis) ([place])   . DM (diabetes mellitus) ([place]) 07/28/2015  . Fall 07/28/2015  . Warfarin-induced coagulopathy ([place]) 07/28/2015  . Chronic diastolic heart failure ([place]) 03/19/2014  . Edema 08/06/2013  . SVT (supraventricular tachycardia) ([place]) 10/15/2012  . First degree AV block 10/15/2012  . Acute renal insufficiency 10/14/2012  . Dizziness and giddiness   . Thrombocytopenia ([place]) 11/21/2011    Past Surgical History:  Procedure Laterality Date  . EYE SURGERY    . SUPRAVENTRICULAR TACHYCARDIA ABLATION N/A 10/11/2012   Procedure: SUPRAVENTRICULAR TACHYCARDIA ABLATION;  Surgeon: Evans Lance, MD;  Location: [facility] CATH LAB;  Service: Cardiovascular;  Laterality: N/A;     OB History   None      Home Medications    Prior to Admission medications   Medication Sig Start Date End Date Taking? Authorizing Provider  ACCU-CHEK AVIVA PLUS test strip USE AS DIRECTED TO CHECK BLOOD SUGAR QID 02/03/16   [provider]  acetaminophen (TYLENOL) 500 MG tablet Take 1,000 mg by mouth every 6 (six) hours as needed.    [provider]  amiodarone (PACERONE) 200 MG tablet TAKE 1 TABLET BY MOUTH DAILY 10/10/17   Jettie Booze, MD  atorvastatin (LIPITOR) 20 MG tablet Take 20 mg by mouth daily.    [provider]  BD PEN NEEDLE NANO U/F 32G X 4 MM MISC U UTD QID FOR 30 DAYS 03/16/16   [provider]  calcium carbonate (OS-CAL) 600 MG TABS Take 600 mg by mouth daily with breakfast.     [provider]  Cyanocobalamin (VITAMIN B 12 PO) Take 1 tablet daily by mouth.    [provider]  ferrous gluconate (FERGON) 324 MG tablet Take 325 mg by mouth daily with breakfast.     [provider]  furosemide (LASIX) 80 MG tablet Take 1 tablet (80  mg total) by mouth 2 (two) times daily. 04/04/18   Daune Perch, NP  gabapentin (NEURONTIN) 300 MG capsule Take 300 mg by mouth at bedtime.  01/17/16   [provider]  HUMALOG KWIKPEN 200 UNIT/ML SOPN Inject into the skin 3 (three) times daily. AS DIRECTED 03/22/16   [provider]  LEVEMIR FLEXTOUCH 100 UNIT/ML Pen Inject 12 units in the am and 12 units at night into skin as directed 01/19/16   [provider]  losartan (COZAAR) 100 MG tablet TAKE 1/2 TABLET(50 MG) BY MOUTH DAILY 02/06/18   Jettie Booze, MD  magnesium oxide (MAG-OX) 400 MG tablet Take 400 mg by mouth daily.    [provider]  metoprolol succinate (TOPROL-XL) 25 MG 24 hr tablet Take 1 tablet (25 mg total) by mouth daily. Take with or immediately following a meal. 04/04/18   Daune Perch, NP  omeprazole (PRILOSEC) 40 MG capsule Take 40 mg by mouth daily.    [provider]  oxybutynin (DITROPAN) 5 MG tablet Take 5 mg by mouth 2 (two) times daily.    [provider]  potassium chloride (MICRO-K) 10 MEQ CR capsule Take 4 tablets by mouth X's 1 day then go back to 2 tablets by mouth daily 01/20/16   Lyda Jester M, PA-C  Vitamin D, Ergocalciferol, (DRISDOL) 50000 UNITS CAPS Take 5,000 Units by mouth 2 (two) times a week.     [provider]  warfarin (COUMADIN) 7.5 MG tablet Take 7.5 mg by mouth daily. Takes 7.5 mg Monday through Friday, and a half tablet Saturday Sunday. 10/18/12   Jettie Booze, MD    Family History Family History  Problem Relation Age of Onset  . Stroke Mother   . Cancer Father   . Hypertension Daughter   . Heart attack Brother        x2  . Hypertension Brother   . Stroke Sister   . Hypertension Sister     Social History Social History   Tobacco Use  . Smoking status: Never Smoker  . Smokeless tobacco: Never Used  Substance Use Topics  . Alcohol use: No  . Drug use: No     Allergies   Aspirin and  Penicillins   Review of Systems Review of Systems  Constitutional: Negative for fever.  HENT: Negative for rhinorrhea and sore throat.   Eyes: Positive for redness. Negative for photophobia, pain and visual disturbance.  Respiratory: Negative for cough.   Cardiovascular: Negative for chest pain.  Gastrointestinal: Positive for abdominal pain. Negative for diarrhea, nausea and vomiting.  Genitourinary: Negative for dysuria.  Musculoskeletal: Negative for myalgias.  Skin: Negative for rash.  Neurological: Negative for headaches.  Physical Exam Updated Vital Signs BP (!) 159/78 (BP Location: Right Arm)   Pulse 64   Temp 98.4 F (36.9 C) (Oral)   Resp 20   Wt 91.6 kg (202 lb)   SpO2 99%   BMI 33.61 kg/m   Physical Exam  Constitutional: She appears well-developed and well-nourished.  HENT:  Head: Normocephalic and atraumatic.  Eyes: Pupils are equal, round, and reactive to light. EOM are normal. Right eye exhibits no discharge. Left eye exhibits no discharge. Right conjunctiva is not injected. Right conjunctiva has no hemorrhage. Left conjunctiva has a hemorrhage.  Neck: Normal range of motion. Neck supple.  Cardiovascular: Normal rate, regular rhythm and normal heart sounds.  Pulmonary/Chest: Effort normal and breath sounds normal.  Abdominal: Soft. There is tenderness. There is no rebound and no guarding.  Mild tenderness, periumbilical.  Neurological: She is alert.  Skin: Skin is warm and dry.  Psychiatric: She has a normal mood and affect.  Nursing note and vitals reviewed.    ED Treatments / Results  Labs (all labs ordered are listed, but only abnormal results are displayed) Labs Reviewed  PROTIME-INR - Abnormal; Notable for the following components:      Result Value   Prothrombin Time 21.4 (*)    All other components within normal limits  CBC WITH DIFFERENTIAL/PLATELET - Abnormal; Notable for the following components:   WBC 3.6 (*)    RBC 3.67 (*)     Hemoglobin 11.1 (*)    HCT 35.0 (*)    Platelets 114 (*)    All other components within normal limits  COMPREHENSIVE METABOLIC PANEL - Abnormal; Notable for the following components:   BUN 24 (*)    Creatinine, Ser 1.20 (*)    Calcium 8.8 (*)    GFR calc non Af Amer 41 (*)    GFR calc Af Amer 47 (*)    All other components within normal limits  URINALYSIS, ROUTINE W REFLEX MICROSCOPIC - Abnormal; Notable for the following components:   Specific Gravity, Urine <1.005 (*)    All other components within normal limits    EKG None  Radiology No results found.  Procedures Procedures (including critical care time)  Medications Ordered in ED Medications - No data to display   Initial Impression / Assessment and Plan / ED Course  I have reviewed the triage vital signs and the nursing notes.  Pertinent labs & imaging results that were available during my care of the patient were reviewed by me and considered in my medical decision making (see chart for details).     Patient seen and examined.  Patient and family at bedside counseled on typical course and cause of subconjunctival hemorrhage.  Given that patient is on Coumadin, will check INR.  Given her recent abdominal pain, which is the lesser of her 2 complaints today, will check lab work.  On exam she has only minimal tenderness to palpation.  Vital signs reviewed and are as follows: BP (!) 159/78 (BP Location: Right Arm)   Pulse 64   Temp 98.4 F (36.9 C) (Oral)   Resp 20   Wt 91.6 kg (202 lb)   SpO2 99%   BMI 33.61 kg/m   5:05 PM patient discussed with and seen by Dr. Sherry Ruffing.  Patient without any abdominal pain at time of his exam.  Lab work is reassuring.  At this time, feel comfortable with discharge home with PCP follow-up as needed.  Family member will call and report her current  INR.  The patient was urged to return to the Emergency Department immediately with worsening of current symptoms, worsening abdominal  pain, persistent vomiting, blood noted in stools, fever, or any other concerns. The patient verbalized understanding.    Final Clinical Impressions(s) / ED Diagnoses   Final diagnoses:  Subconjunctival hemorrhage of left eye  Periumbilical abdominal pain   Subconjunctival hemorrhage: No visible obvious etiology however patient is anticoagulated.  INR is 1.8 today.  Patient and family educated.  Discussed expectant management.  Periumbilical abdominal pain: Minimal on exam here.  This is secondary complaint per patient.  No associated symptoms.  Lab work-up is reassuring.  Patient does not have significant findings on exam and no further evaluation with imaging felt indicated at this time unless symptoms were to worsen.  Patient counseled.  ED Discharge Orders    None       Carlisle Cater, [place] 05/12/18 1706    Tegeler, Gwenyth Allegra, MD 05/13/18 [phone]

## 2018-05-16 ENCOUNTER — Other Ambulatory Visit: Payer: Self-pay | Admitting: *Deleted

## 2018-05-16 NOTE — Patient Outreach (Signed)
[facility]) Care Management  05/16/2018  [name] 1933/03/07 [id number]   RN Health Coach Monthly Outreach  Referral Date:11/22/2016 Referral Source:THN Screening Reason for Referral:Disease Management/Education Insurance:[employer] Medicare   Outreach Attempt:  Outreach attempt #2 to patient for monthly follow up. No answer and unable to leave voicemail message due to voicemail not engaging.  Plan:  RN Health Coach will make another outreach attempt to patient within 6 business days if no return call back from patient.  [place] [phone] [email or web address]

## 2018-05-21 ENCOUNTER — Ambulatory Visit: Payer: Self-pay | Admitting: *Deleted

## 2018-05-22 ENCOUNTER — Encounter: Payer: Self-pay | Admitting: *Deleted

## 2018-05-22 ENCOUNTER — Other Ambulatory Visit: Payer: Self-pay | Admitting: *Deleted

## 2018-05-22 NOTE — Patient Outreach (Signed)
[facility] [facility]) Care Management  05/22/2018  [name] 08-18-1933 [id number]   RN Health Coach Monthly Outreach  Referral Date:11/22/2016 Referral Source:THN Screening Reason for Referral:Disease Management/Education Insurance:[employer] Medicare   Outreach Attempt:  Successful telephone outreach to patient for monthly follow up.  HIPAA verified with patient.  Patient stating she is doing well.  Denies any recent falls and states her swelling in her leg is going down reducing some of the leg pain.  Stating primary care has adjusted her Lasix dose and since then her swelling is going down.  [facility] [place] visit related to vision changes.  States she had blood vessel bust in left eye; redness is clearing and vision is getting better.  Encouraged to follow up with her eye doctor.  Fasting blood sugar this morning was 98 with ranges of 70-209.  Continues to weigh daily.  Weight this morning was 197.4 pounds.  Denies any shortness of breath or chest pains at this time.  Appointments:  Patient attended appointment th nurse practitioner with primary care 05/08/2018 and follow up in 2 weeks.  Scheduled appointment with Dr. Chalmers Cater on 05/28/2018.  Plan: RN Health Coach will make next monthly outreach to patient in the month of August.  Johnrobert Foti RN [place] [phone] [name]

## 2018-05-31 LAB — BASIC METABOLIC PANEL
BUN: 20 (ref 4–21)
Creatinine: 1.3 — AB (ref 0.5–1.1)
Glucose: 126
POTASSIUM: 3.9 (ref 3.4–5.3)
SODIUM: 139 (ref 137–147)

## 2018-05-31 LAB — IRON,TIBC AND FERRITIN PANEL
%SAT: 12
IRON: 33
TIBC: 273
UIBC: 240

## 2018-06-11 ENCOUNTER — Other Ambulatory Visit: Payer: Self-pay | Admitting: *Deleted

## 2018-06-11 NOTE — Patient Outreach (Signed)
[facility]) Care Management  06/11/2018  [name] 1933/01/22 [id number]   RN Health Coach Monthly Outreach  Referral Date:11/22/2016 Referral Source:THN Screening Reason for Referral:Disease Management/Education Insurance:[employer] Medicare   Outreach Attempt:  Outreach attempt #1 to patient for monthly follow up. No answer. RN Health Coach left HIPAA compliant voicemail message along with contact information.  Plan:  RN Health Coach will send unsuccessful outreach letter to patient.  RN Health Coach will make another outreach attempt to patient within 3-4 business days if no return call back from patient.  [place] [phone] Saban [email or web address]

## 2018-06-14 ENCOUNTER — Other Ambulatory Visit: Payer: Self-pay | Admitting: *Deleted

## 2018-06-14 ENCOUNTER — Encounter: Payer: Self-pay | Admitting: *Deleted

## 2018-06-14 NOTE — Patient Outreach (Signed)
[facility]) Care Management  [place]  06/14/2018   ANJALI MANZELLA [birth year] [id number]   RN Health Coach Monthly Outreach   Referral Date: 11/22/2016 Referral Source: [facility] Screening Reason for Referral: Disease Management/Education Insurance: [employer]    Outreach Attempt:  Successful telephone outreach to patient for monthly follow up.  HIPAA verified with patient.  Patient stating she is doing a little better.  Reports pain in her right leg, but pain getting better after starting new medication as prescribed by physician.  Continues to monitor blood sugars.  Fasting blood sugar this morning was 122 with ranges of 90-140's.  Reports one hypoglycemic event related to forgetting to eat lunch.  Current Hgb A1C is 8.2 on 05/28/2018. Continues to weigh daily.  Weight this morning was 194.6.  Denies any lower extremity edema or shortness of breath.   Encounter Medications:  Outpatient Encounter Medications as of 06/14/2018  Medication Sig Note  . ACCU-CHEK AVIVA PLUS test strip USE AS DIRECTED TO CHECK BLOOD SUGAR QID   . acetaminophen (TYLENOL) 500 MG tablet Take 1,000 mg by mouth every 6 (six) hours as needed.   [name] amiodarone (PACERONE) 200 MG tablet TAKE 1 TABLET BY MOUTH DAILY   . atorvastatin (LIPITOR) 20 MG tablet Take 20 mg by mouth daily.   . BD PEN NEEDLE NANO U/F 32G X 4 MM MISC U UTD QID FOR 30 DAYS   . Cyanocobalamin (VITAMIN B 12 PO) Take 1 tablet daily by mouth.   . ferrous gluconate (FERGON) 324 MG tablet Take 325 mg by mouth daily with breakfast.    . furosemide (LASIX) 80 MG tablet Take 1 tablet (80 mg total) by mouth 2 (two) times daily. 06/14/2018: Reports taking 80 mg daily and an extra 64m with lunch on Monday/Wednesday/Friday  . gabapentin (NEURONTIN) 300 MG capsule Take 300 mg by mouth at bedtime.    .Marland KitchenHUMALOG KWIKPEN 200 UNIT/ML SOPN Inject into the skin 3 (three) times daily. AS DIRECTED 01/11/2018: Sliding Scale 100-150 mg/dL  take 4 units; 151-200 mg/dL take 5 units;201-260 mg/dL take 6 units  . LEVEMIR FLEXTOUCH 100 UNIT/ML Pen Inject 12 units in the am and 12 units at night into skin as directed 06/14/2018: Reports taking 16 units every am and 10 units in the evening  . losartan (COZAAR) 100 MG tablet TAKE 1/2 TABLET(50 MG) BY MOUTH DAILY   . magnesium oxide (MAG-OX) 400 MG tablet Take 400 mg by mouth daily.   . metoprolol succinate (TOPROL-XL) 25 MG 24 hr tablet Take 1 tablet (25 mg total) by mouth daily. Take with or immediately following a meal. 06/14/2018: Reports taking 50 mg daily  . omeprazole (PRILOSEC) 40 MG capsule Take 40 mg by mouth daily.   .Marland Kitchenoxybutynin (DITROPAN) 5 MG tablet Take 5 mg by mouth 2 (two) times daily.   . potassium chloride (MICRO-K) 10 MEQ CR capsule Take 4 tablets by mouth X's 1 day then go back to 2 tablets by mouth daily 06/14/2018: Reports taking 2 tablets twice a day  . pregabalin (LYRICA) 75 MG capsule Take 75 mg by mouth at bedtime.   . Vitamin D, Ergocalciferol, (DRISDOL) 50000 UNITS CAPS Take 5,000 Units by mouth 2 (two) times a week.  01/11/2018: Uses OTC product, Vitamin D3, daily instead  . warfarin (COUMADIN) 7.5 MG tablet Take 7.5 mg by mouth daily. Takes 7.5 mg Monday through Friday, and a half tablet Saturday Sunday. 01/11/2018: Uses Brand name  . calcium carbonate (  OS-CAL) 600 MG TABS Take 600 mg by mouth daily with breakfast.  06/14/2018: Reports not taking any longer because tablets are too big   No facility-administered encounter medications on file as of 06/14/2018.     Functional Status:  In your present state of health, do you have any difficulty performing the following activities: 01/15/2018 11/03/2017  Hearing? N N  Vision? N N  Difficulty concentrating or making decisions? N N  Walking or climbing stairs? Y Y  Dressing or bathing? Y Y  Comment - gets assistance with baths  Doing errands, shopping? Tempie Donning  Preparing Food and eating ? Y Y  Using the Toilet? N Y  In the  past six months, have you accidently leaked urine? N Y  Comment - -  Do you have problems with loss of bowel control? N N  Managing your Medications? Y Y  Comment - aide assist with medications  Managing your Finances? Y N  Housekeeping or managing your Housekeeping? Y Y  Some recent data might be hidden    Fall/Depression Screening: Fall Risk  05/22/2018 04/12/2018 02/06/2018  Falls in the past year? Yes Yes No  Comment No falls this month Fall a few weeks ago denies any recent falls  Number falls in past yr: 1 1 -  Injury with Fall? No No -  Risk for fall due to : Impaired balance/gait;Impaired mobility;Impaired vision;History of fall(s);Medication side effect Impaired balance/gait;Impaired mobility;History of fall(s);Impaired vision -  Follow up Falls prevention discussed;Education provided Falls evaluation completed -   PHQ 2/9 Scores 01/15/2018 09/01/2017 08/10/2017 05/19/2017 11/29/2016 11/22/2016  PHQ - 2 Score 0 0 0 0 0 0    THN CM Care Plan Problem One     Most Recent Value  Care Plan Problem One  Knowledge deficiet related to importance of medical self care management of diabetes and congestive heart failure.  Role Documenting the Problem One  [place] for Problem One  Active  [facility] Long Term Goal   Patient will report a decrease in her A1C by 0.5 point in the next 90 days.  THN Long Term Goal Start Date  06/14/18  Interventions for Problem One Long Term Goal  Current care plan and goals reviewed and discussed with patient, reviewed latest Hgb A1C value with patient, encouraged patient to make dietary changes to help reduce Hgb A1C, encouraged patient to review diet education mailed to her, reviewed medications and encouraged compliance, encouraged patient to keep and attend medical appointments  THN CM Short Term Goal #1   Patient will report 1 diet change to lower blood sugars in the next 30 days.  THN CM Short Term Goal #1 Start Date  06/14/18  [facility] CM Short Term Goal  #1 Met Date  06/14/18  Interventions for Short Term Goal #1  Sending EMMI Diabetes Diet and EMMI Low Salt Diet, encouraged patient to review diet education, discussed current glucose ranges and current A1C and ways to reduce, encouraged healthier meal and drink options, discussed limiting sodas to only diet drinks if possible as recommended by endocrinologist  [facility] CM Short Term Goal #2   Patient will report making dental appointment in the next 30 days.  THN CM Short Term Goal #2 Start Date  06/14/18  [facility] CM Short Term Goal #2 Met Date  06/14/18  Interventions for Short Term Goal #2  Discussed with patient verifying with insurance company dental and denture benefits, discussed importance of good oral health care, dicsussed  benefits of dentures, encouraged patient to make dental appointment soon     Appointments:  Reports attending appointment with primary care provider on 05/31/2018 and thinks she follow up in September.  Reports attending appointment with Dr. Chalmers Cater on 05/28/2018.  Plan: RN Health Coach will make next monthly outreach to patient in the month of September. RN Health Coach will send primary care provider Quarterly Update.  [place] [phone] [email or web address]

## 2018-07-05 ENCOUNTER — Telehealth: Payer: Self-pay | Admitting: Interventional Cardiology

## 2018-07-05 NOTE — Telephone Encounter (Signed)
Called patient back. Patient complaining of swelling BLE, right leg more than left. Patient is also having SOB with activity for two days. Patient denies any chest pain. Patient's Myoview and BLE duplex was fine. Patient feels like she might need to see a vein specialist. Patient would like to see someone before October, when her appt with Dr. Irish Lack is scheduled. Made an appointment with Bhagat PA for tomorrow so patient can be evaluated. Patient agree to plan and will come tomorrow for an appointment with PA. Encouraged patient if her symptoms get worse to seek medical attention ASAP.

## 2018-07-05 NOTE — Telephone Encounter (Signed)
New Message:    Pt c/o swelling: STAT is pt has developed SOB within 24 hours  1) How much weight have you gained and in what time span? NO   2) If swelling, where is the swelling located? Right Leg   3) Are you currently taking a fluid pill? Yes , furosemide (LASIX) 80 MG tablet  4) Are you currently SOB? Yes  Do you have a log of your daily weights (if so, list)? NA  5) Have you gained 3 pounds in a day or 5 pounds in a week? NA  6) Have you traveled recently? No     Pt c/o Shortness Of Breath: STAT if SOB developed within the last 24 hours or pt is noticeably SOB on the phone  1. Are you currently SOB (can you hear that pt is SOB on the phone)? No   2. How long have you been experiencing SOB? 2 Days   3. Are you SOB when sitting or when up moving around? Moving Around   4. Are you currently experiencing any other symptoms? NO

## 2018-07-06 ENCOUNTER — Encounter: Payer: Self-pay | Admitting: Physician Assistant

## 2018-07-06 ENCOUNTER — Ambulatory Visit: Payer: Medicare Other | Admitting: Physician Assistant

## 2018-07-06 VITALS — BP 126/62 | HR 69 | Ht 65.0 in | Wt 171.5 lb

## 2018-07-06 DIAGNOSIS — N183 Chronic kidney disease, stage 3 unspecified: Secondary | ICD-10-CM

## 2018-07-06 DIAGNOSIS — I5032 Chronic diastolic (congestive) heart failure: Secondary | ICD-10-CM | POA: Diagnosis not present

## 2018-07-06 DIAGNOSIS — I110 Hypertensive heart disease with heart failure: Secondary | ICD-10-CM | POA: Diagnosis not present

## 2018-07-06 DIAGNOSIS — L819 Disorder of pigmentation, unspecified: Secondary | ICD-10-CM

## 2018-07-06 DIAGNOSIS — I471 Supraventricular tachycardia: Secondary | ICD-10-CM

## 2018-07-06 LAB — BASIC METABOLIC PANEL
BUN/Creatinine Ratio: 19 (ref 12–28)
BUN: 28 mg/dL — AB (ref 8–27)
CALCIUM: 9.1 mg/dL (ref 8.7–10.3)
CO2: 23 mmol/L (ref 20–29)
Chloride: 100 mmol/L (ref 96–106)
Creatinine, Ser: 1.48 mg/dL — ABNORMAL HIGH (ref 0.57–1.00)
GFR calc Af Amer: 37 mL/min/{1.73_m2} — ABNORMAL LOW (ref >59)
GFR, EST NON AFRICAN AMERICAN: 33 mL/min/{1.73_m2} — AB (ref >59)
Glucose: 147 mg/dL — ABNORMAL HIGH (ref 65–99)
Potassium: 4.3 mmol/L (ref 3.5–5.2)
Sodium: 142 mmol/L (ref 134–144)

## 2018-07-06 LAB — PRO B NATRIURETIC PEPTIDE: NT-Pro BNP: 1500 pg/mL — ABNORMAL HIGH (ref 0–738)

## 2018-07-06 NOTE — Progress Notes (Signed)
Cardiology Office Note    Date:  07/06/2018   ID:  [name], DOB 1933-09-11, MRN [id number]  PCP:  [name], MD  Cardiologist: Dr. Irish Lack  Chief Complaint:DOE and LE edema   History of Present Illness:   [name] is a 84 y.o. female with a past medical history significant for DVT (on Coumadin for this), chronic diastolic CHF, she also has deconditioning, obesity, HTN, SVT (correlated with pre-syncope, on amiodarone), DM, chronic anemia/thrombocytopenia, LBBB, probable CKD stage III (Cr 1.3 in 08/2014), mod MR by echo 2014 who presents for DOE and LE edema.  Per notes in 2013 she had an EPS with ablation for SVT which did not eliminate the SVT completely. She also had bradycardia which limited medication. She was placed on amiodarone by Dr. Lovena Le. Last echo 10/12/2017 EF 50-55%, mild LVH, mild MR, severe LAE, mild TR with severe pulmonary hypertension.  Seen 11/05/2016 by Dr. Irish Lack at that time the patient had been noted to be having some possible TIA symptoms.  She underwent carotid Dopplers that showed minimal plaque, no significant disease as well as echocardiogram which showed normal LV function and no obvious source of embolus.  A brain MRI on 11/17/2017 showed no active disease. She says that she was told by neurology that she did have a stroke that effected her speech and somewhat her swallowing.  Seen by Daune Perch, NP 04/04/18. Had DOE and chest pain. Stress test low risk. Had normal ABI.  Here today for follow up.  Patient has chronic bilateral lower extremity edema with knee issue and venous stasis.  She has significant skin discoloration on her bilateral leg.  Follows by endocrinologist.  She states that her PCP has reduced her Lasix.  Currently takes Lasix 80 mg twice daily except Monday Wednesday Friday when she takes 40 mg twice daily.  Weight has been trending down.  She has chronic kidney disease and follows by Dr. Moshe Cipro.  She also has chronic stable  dyspnea on exertion.  No chest pain, palpitation, orthopnea, PND or syncope.  Compliant with low-sodium diet.  Past Medical History:  Diagnosis Date  . Anemia   . Arthritis   . Cataract   . Chronic diastolic CHF (congestive heart failure) ([place])   . CKD (chronic kidney disease), stage III ([place])   . Clotting disorder ([place])   . Diabetes mellitus ([place])   . Dizziness and giddiness   . DVT (deep venous thrombosis) ([place])    a. On Coumadin for this.  . Essential hypertension   . LBBB (left bundle branch block)   . Mitral regurgitation    a. Mod MR by echo 2014.  . Muscular deconditioning   . Obesity   . SVT (supraventricular tachycardia) ([place])    a. In 2013 she had an EPS with ablation for SVT which did not eliminate the SVT completely. She also had bradycardia which limited medication. She was placed on amiodarone by Dr. Lovena Le.  . Thrombocytopenia ([place]) 11/21/2011    Past Surgical History:  Procedure Laterality Date  . EYE SURGERY    . SUPRAVENTRICULAR TACHYCARDIA ABLATION N/A 10/11/2012   Procedure: SUPRAVENTRICULAR TACHYCARDIA ABLATION;  Surgeon: Evans Lance, MD;  Location: [facility] CATH LAB;  Service: Cardiovascular;  Laterality: N/A;    Current Medications: Prior to Admission medications   Medication Sig Start Date End Date Taking? Authorizing Provider  ACCU-CHEK AVIVA PLUS test strip USE AS DIRECTED TO CHECK BLOOD SUGAR QID 02/03/16   [provider]  acetaminophen (TYLENOL) 500 MG tablet Take 1,000 mg by mouth every 6 (six) hours as needed.    [provider]  amiodarone (PACERONE) 200 MG tablet TAKE 1 TABLET BY MOUTH DAILY 10/10/17   Jettie Booze, MD  atorvastatin (LIPITOR) 20 MG tablet Take 20 mg by mouth daily.    [provider]  BD PEN NEEDLE NANO U/F 32G X 4 MM MISC U UTD QID FOR 30 DAYS 03/16/16   [provider]  calcium carbonate (OS-CAL) 600 MG TABS Take 600 mg by mouth daily with breakfast.     [provider]    Cyanocobalamin (VITAMIN B 12 PO) Take 1 tablet daily by mouth.    [provider]  ferrous gluconate (FERGON) 324 MG tablet Take 325 mg by mouth daily with breakfast.     [provider]  furosemide (LASIX) 80 MG tablet Take 1 tablet (80 mg total) by mouth 2 (two) times daily. 04/04/18   Daune Perch, NP  gabapentin (NEURONTIN) 300 MG capsule Take 300 mg by mouth at bedtime.  01/17/16   [provider]  HUMALOG KWIKPEN 200 UNIT/ML SOPN Inject into the skin 3 (three) times daily. AS DIRECTED 03/22/16   [provider]  LEVEMIR FLEXTOUCH 100 UNIT/ML Pen Inject 12 units in the am and 12 units at night into skin as directed 01/19/16   [provider]  losartan (COZAAR) 100 MG tablet TAKE 1/2 TABLET(50 MG) BY MOUTH DAILY 02/06/18   Jettie Booze, MD  magnesium oxide (MAG-OX) 400 MG tablet Take 400 mg by mouth daily.    [provider]  metoprolol succinate (TOPROL-XL) 25 MG 24 hr tablet Take 1 tablet (25 mg total) by mouth daily. Take with or immediately following a meal. 04/04/18   Daune Perch, NP  omeprazole (PRILOSEC) 40 MG capsule Take 40 mg by mouth daily.    [provider]  oxybutynin (DITROPAN) 5 MG tablet Take 5 mg by mouth 2 (two) times daily.    [provider]  potassium chloride (MICRO-K) 10 MEQ CR capsule Take 4 tablets by mouth X's 1 day then go back to 2 tablets by mouth daily 01/20/16   Lyda Jester M, PA-C  pregabalin (LYRICA) 75 MG capsule Take 75 mg by mouth at bedtime.    [provider]  Vitamin D, Ergocalciferol, (DRISDOL) 50000 UNITS CAPS Take 5,000 Units by mouth 2 (two) times a week.     [provider]  warfarin (COUMADIN) 7.5 MG tablet Take 7.5 mg by mouth daily. Takes 7.5 mg Monday through Friday, and a half tablet Saturday Sunday. 10/18/12   Jettie Booze, MD    Allergies:   Aspirin and Penicillins   Social History   Socioeconomic History  . Marital status:  Widowed    Spouse name: Not on file  . Number of children: Not on file  . Years of education: Not on file  . Highest education level: Not on file  Occupational History  . Not on file  Social Needs  . Financial resource strain: Not on file  . Food insecurity:    Worry: Not on file    Inability: Not on file  . Transportation needs:    Medical: No    Non-medical: No  Tobacco Use  . Smoking status: Never Smoker  . Smokeless tobacco: Never Used  Substance and Sexual Activity  . Alcohol use: No  . Drug use: No  . Sexual activity: Not on file  Lifestyle  .  Physical activity:    Days per week: Not on file    Minutes per session: Not on file  . Stress: Not on file  Relationships  . Social connections:    Talks on phone: Not on file    Gets together: Not on file    Attends religious service: Not on file    Active member of club or organization: Not on file    Attends meetings of clubs or organizations: Not on file    Relationship status: Not on file  Other Topics Concern  . Not on file  Social History Narrative   Lives at home alone   She has a nursing aid who comes to her home for 2.5 hrs/day x 4 days/week    Right handed   1 cup caffeine daily     Family History:  The patient's family history includes Cancer in her father; Heart attack in her brother; Hypertension in her brother, daughter, and sister; Stroke in her mother and sister.   ROS:   Please see the history of present illness.    ROS All other systems reviewed and are negative.   PHYSICAL EXAM:   VS:  BP 126/62   Pulse 69   Ht 5\' 5"  (1.651 m)   Wt 171 lb 8 oz (77.8 kg)   SpO2 98%   BMI 28.54 kg/m    GEN: Elderly female in no acute distress  HEENT: normal  Neck: no JVD, carotid bruits, or masses Cardiac: RRR; no murmurs, rubs, or gallops,1-2+ bilateral lower extremity edema with venous stasis Respiratory:  clear to auscultation bilaterally, normal work of breathing GI: soft, nontender, nondistended, +  BS MS: no deformity or atrophy  Skin: warm and dry, no rash Neuro:  Alert and Oriented x 3, Strength and sensation are intact Psych: euthymic mood, full affect  Wt Readings from Last 3 Encounters:  07/06/18 171 lb 8 oz (77.8 kg)  05/12/18 202 lb (91.6 kg)  04/04/18 202 lb (91.6 kg)      Studies/Labs Reviewed:   EKG:  EKG is not ordered today.    Recent Labs: 04/04/2018: NT-Pro BNP 1,189 05/12/2018: ALT 35; BUN 24; Creatinine, Ser 1.20; Hemoglobin 11.1; Platelets 114; Potassium 3.8; Sodium 142   Lipid Panel    Component Value Date/Time   CHOL (H) 12/11/2008 0130    202        ATP III CLASSIFICATION:  <200     mg/dL   Desirable  200-239  mg/dL   Borderline High  >=240    mg/dL   High          TRIG 64 12/11/2008 0130   HDL 78 12/11/2008 0130   CHOLHDL 2.6 12/11/2008 0130   VLDL 13 12/11/2008 0130   LDLCALC (H) 12/11/2008 0130    111        Total Cholesterol/HDL:CHD Risk Coronary Heart Disease Risk Table                     Men   Women  1/2 Average Risk   3.4   3.3  Average Risk       5.0   4.4  2 X Average Risk   9.6   7.1  3 X Average Risk  23.4   11.0        Use the calculated Patient Ratio above and the CHD Risk Table to determine the patient's CHD Risk.        ATP III CLASSIFICATION (LDL):  <  100     mg/dL   Optimal  100-129  mg/dL   Near or Above                    Optimal  130-159  mg/dL   Borderline  160-189  mg/dL   High  >190     mg/dL   Very High    Additional studies/ records that were reviewed today include:   Arterial doppler 04/23/18 Final Interpretation: Right: Resting right ankle-brachial index is within normal range. No evidence of significant right lower extremity arterial disease. The right toe-brachial index is normal.   Left: Resting left ankle-brachial index is within normal range. No evidence of significant left lower extremity arterial disease. The left toe-brachial index is normal.   Stress test 04/13/18   Nuclear stress EF:  50%.  Defect 1: There is a medium defect of severe severity present in the basal inferoseptal and mid inferoseptal location.  This is a low risk study.  The left ventricular ejection fraction is mildly decreased (45-54%).   Abnormal, low risk stress nuclear study with fixed inferoseptal defect c/w LBBB vs prior infarct; no ischemia; EF 50 with hypokinesis of the inferosepal wall; mild LVE.   Echocardiogram: 09/2017 Study Conclusions   - Left ventricle: The cavity size was normal. Wall thickness was   increased in a pattern of mild LVH. There was moderate focal   basal hypertrophy of the septum. Systolic function was normal.   The estimated ejection fraction was in the range of 50% to 55%.   Wall motion was normal; there were no regional wall motion   abnormalities. The study is not technically sufficient to allow   evaluation of LV diastolic function. - Ventricular septum: Septal motion showed abnormal function and   dyssynergy. - Mitral valve: Calcified annulus. Mildly thickened leaflets .   There was mild regurgitation. - Left atrium: The atrium was severely dilated. - Atrial septum: The septum bowed from left to right, consistent   with increased left atrial pressure. - Pulmonary arteries: Systolic pressure was severely increased.  Impressions:  - Low normal to mild global reduction in LV systolic function (EF   50); mild LVH with proximal septal thickening; mild MR; severe   LAE; mild TR with severe pulmonary hypertension.      ASSESSMENT & PLAN:    1. Chronic diastolic heart failure -Her dyspnea on exertion seems multifactorial from obesity and deconditioning.  She does not have any chest pain.  Recent stress test reassuring.  She has severe pulmonary hypertension.  If no improvement will need for right heart cath to Dr. Irish Lack.  2.  Bilateral lower extremity edema with venous stasis -Per patient her weight trending down despite reduce Lasix as discussed  above.  Will check BMP and BNP.  She also has severe knee issue.  Encouraged leg elevation and compression stocking.  No arterial disease bilaterally on Doppler.  3.  Hypertensive heart disease -Blood pressure stable on current medication  4.  History of DVT -On warfarin therapy.  Endorsed compliance.  5.  SVT -Denies palpitation.  On amiodarone.   Medication Adjustments/Labs and Tests Ordered: Current medicines are reviewed at length with the patient today.  Concerns regarding medicines are outlined above.  Medication changes, Labs and Tests ordered today are listed in the Patient Instructions below. Patient Instructions  Medication Instructions:  Your physician recommends that you continue on your current medications as directed. Please refer to the Current Medication list given to  you today.   Labwork: TODAY: BMET, BNP  Testing/Procedures: None ordered  Follow-Up: Your physician recommends that you keep you appointment with DR. VARANASI 10/7 AT 9 AM. PLEASE MAKE SURE TO ARRIVE 15 MINUTES PRIOR TO YOUR APPOINTMENT.    Any Other Special Instructions Will Be Listed Below (If Applicable).     If you need a refill on your cardiac medications before your next appointment, please call your pharmacy.       Jarrett Soho, [place]  07/06/2018 12:08 PM    [address], [place], [place]  [address] Phone: [phone]; Fax: [phone]

## 2018-07-06 NOTE — Patient Instructions (Addendum)
Medication Instructions:  Your physician recommends that you continue on your current medications as directed. Please refer to the Current Medication list given to you today.   Labwork: TODAY: BMET, BNP  Testing/Procedures: None ordered  Follow-Up: Your physician recommends that you keep you appointment with DR. VARANASI 10/7 AT 9 AM. PLEASE MAKE SURE TO ARRIVE 15 MINUTES PRIOR TO YOUR APPOINTMENT.    Any Other Special Instructions Will Be Listed Below (If Applicable).     If you need a refill on your cardiac medications before your next appointment, please call your pharmacy.

## 2018-07-09 ENCOUNTER — Telehealth: Payer: Self-pay | Admitting: *Deleted

## 2018-07-09 DIAGNOSIS — Z79899 Other long term (current) drug therapy: Secondary | ICD-10-CM

## 2018-07-09 MED ORDER — FUROSEMIDE 80 MG PO TABS: 80.0000 mg | ORAL_TABLET | Freq: Two times a day (BID) | ORAL | 9 refills | Status: DC

## 2018-07-09 NOTE — Telephone Encounter (Signed)
-----   Message from [place], [place] sent at 07/06/2018  3:49 PM EDT ----- Renal function relatively stable. Evidence of volume overload. Increase lasix to 80mg  BID every day. Have BMET and BNP check in 4 weeks.

## 2018-07-19 ENCOUNTER — Encounter: Payer: Self-pay | Admitting: *Deleted

## 2018-07-19 ENCOUNTER — Other Ambulatory Visit: Payer: Self-pay | Admitting: *Deleted

## 2018-07-19 DIAGNOSIS — Z7901 Long term (current) use of anticoagulants: Secondary | ICD-10-CM | POA: Diagnosis not present

## 2018-07-19 NOTE — Patient Outreach (Signed)
[facility]) Care Management  07/19/2018  MIJA EFFERTZ 1933/08/29 [id number]   RN Health Coach Monthly Outreach  Referral Date:11/22/2016 Referral Source:THN Screening Reason for Referral:Disease Management/Education Insurance:[employer] Medicare   Outreach Attempt:  Successful telephone outreach to patient for monthly follow up.  HIPAA verified with patient.  Patient verbalizing her scale is not working after changing the batteries as requested by the tele health nurse.  States she has not weighed since about Sunday and is trying to get in touch with tele health nurse.  Instructed patient to call the nurse line number on the back of her [employer] card.  Patient found insurance card and located telephone number and stated she would call.  Reviewed with patient instructions in chart from recent Cardiology visit and increase dosing of her Lasix to 80 mg twice a day everyday.  Reports she got confused and has been still taking her Lasix as her primary care physician directed her 80 mg twice a day except Monday Wednesday and Friday to take 40 mg in the afternoon.  Discussed with patient the follow up telephone call from Cardiologist office with lab results recently and new instructions of medications and patient stated she now understands to take her Lasix 80 mg twice a day.  States her lower extremity swelling is better and feels her shortness of breath is better.  Reports this mornings fasting blood sugar was 86.  Appointments:  Patient states she last saw, [name] on 06/20/2018 and thinks her next appointment is in October.  Encouraged patient to verify follow up appointment.  Has scheduled follow up with Dr. Irish Lack on 08/07/2018 and Dr. Chalmers Cater on 09/07/2018.  Plan: RN Health Coach will make next telephone outreach to patient in the month of October.  [place] [phone] [email or web address]

## 2018-07-30 ENCOUNTER — Ambulatory Visit: Payer: Medicare Other | Admitting: Interventional Cardiology

## 2018-08-02 LAB — POCT INR: INR: 2.7 — AB (ref 0.9–1.1)

## 2018-08-03 ENCOUNTER — Encounter: Payer: Self-pay | Admitting: Cardiology

## 2018-08-05 ENCOUNTER — Other Ambulatory Visit: Payer: Self-pay | Admitting: Internal Medicine

## 2018-08-06 ENCOUNTER — Other Ambulatory Visit: Payer: Medicare Other | Admitting: *Deleted

## 2018-08-06 DIAGNOSIS — Z79899 Other long term (current) drug therapy: Secondary | ICD-10-CM

## 2018-08-07 LAB — BASIC METABOLIC PANEL
BUN/Creatinine Ratio: 19 (ref 12–28)
BUN: 28 mg/dL — AB (ref 8–27)
CALCIUM: 8.9 mg/dL (ref 8.7–10.3)
CHLORIDE: 101 mmol/L (ref 96–106)
CO2: 24 mmol/L (ref 20–29)
Creatinine, Ser: 1.46 mg/dL — ABNORMAL HIGH (ref 0.57–1.00)
GFR calc Af Amer: 38 mL/min/{1.73_m2} — ABNORMAL LOW (ref >59)
GFR calc non Af Amer: 33 mL/min/{1.73_m2} — ABNORMAL LOW (ref >59)
GLUCOSE: 121 mg/dL — AB (ref 65–99)
Potassium: 4.2 mmol/L (ref 3.5–5.2)
Sodium: 143 mmol/L (ref 134–144)

## 2018-08-07 LAB — PRO B NATRIURETIC PEPTIDE: NT-PRO BNP: 1604 pg/mL — AB (ref 0–738)

## 2018-08-11 ENCOUNTER — Encounter: Payer: Self-pay | Admitting: Internal Medicine

## 2018-08-11 LAB — POCT INR: INR: 1.5 — AB (ref <=1.1)

## 2018-08-13 ENCOUNTER — Encounter: Payer: Self-pay | Admitting: *Deleted

## 2018-08-13 ENCOUNTER — Other Ambulatory Visit: Payer: Self-pay | Admitting: *Deleted

## 2018-08-13 NOTE — Patient Outreach (Signed)
[facility]) Care Management  08/13/2018  JEANANN BALINSKI 01-Apr-1933 [id number]   RN Health Coach Monthly Outreach  Referral Date:11/22/2016 Referral Source:THN Screening Reason for Referral:Disease Management/Education Insurance:[employer] Medicare   Outreach Attempt:  Successful telephone outreach to patient for monthly follow up.  HIPAA verified with patient.  Patient's voice very hoarse.  Denies any fever or increased shortness of breath.  Encouraged patient to contact primary care provider for increasing cold/flu like symptoms.  Reviewed with patient dose of lasix prescribed by Cardiologist.  Patient stating Nephrologist also recommended same lasix dose as Cardiologist and reports she is taking the 80 mg twice a day dosage prescribed.  Stating with this dosage she has less lower extremity edema.  Patient reporting she has had hypoglycemic episodes this morning.  Fasting blood sugar this morning was 52. States she did not feel well enough to drive herself to Endocrinologist appointment today.  Also reporting another glucose reading of 68 this afternoon.  Patient encouraged to notify physician for continued hypoglycemic events.  Reporting fasting glucose ranges of 120's.  Patient reporting she has received new telephonic scale and is weighing daily.  Believes her weight this morning was 192 pounds.  Appointments:  Reports a scheduled appointment with Dr. Bobetta Lime, PCP on 08/15/2018.  Missed appointment with Dr. Chalmers Cater, Endocrinologist today and has rescheduled for 09/24/2018.  Plans to call office periodically for cancelled appointments.  Scheduled Cardiology appointment on 08/21/2018.  Plan:  RN Health Coach will make next telephone outreach to patient in the month of November.   [place] [phone] [email or web address]

## 2018-08-15 ENCOUNTER — Ambulatory Visit (INDEPENDENT_AMBULATORY_CARE_PROVIDER_SITE_OTHER): Payer: Medicare Other | Admitting: Internal Medicine

## 2018-08-15 ENCOUNTER — Ambulatory Visit: Payer: Medicare Other | Admitting: Internal Medicine

## 2018-08-15 ENCOUNTER — Encounter: Payer: Self-pay | Admitting: Internal Medicine

## 2018-08-15 VITALS — BP 140/80 | HR 68 | Temp 98.3°F | Ht 65.0 in | Wt 195.6 lb

## 2018-08-15 DIAGNOSIS — M1711 Unilateral primary osteoarthritis, right knee: Secondary | ICD-10-CM | POA: Diagnosis not present

## 2018-08-15 DIAGNOSIS — Z7901 Long term (current) use of anticoagulants: Secondary | ICD-10-CM

## 2018-08-15 DIAGNOSIS — Z794 Long term (current) use of insulin: Secondary | ICD-10-CM

## 2018-08-15 DIAGNOSIS — N183 Chronic kidney disease, stage 3 unspecified: Secondary | ICD-10-CM

## 2018-08-15 DIAGNOSIS — E1122 Type 2 diabetes mellitus with diabetic chronic kidney disease: Secondary | ICD-10-CM | POA: Diagnosis not present

## 2018-08-15 DIAGNOSIS — Z5181 Encounter for therapeutic drug level monitoring: Secondary | ICD-10-CM

## 2018-08-15 DIAGNOSIS — Z23 Encounter for immunization: Secondary | ICD-10-CM | POA: Diagnosis not present

## 2018-08-15 DIAGNOSIS — I130 Hypertensive heart and chronic kidney disease with heart failure and stage 1 through stage 4 chronic kidney disease, or unspecified chronic kidney disease: Secondary | ICD-10-CM | POA: Diagnosis not present

## 2018-08-15 DIAGNOSIS — N184 Chronic kidney disease, stage 4 (severe): Principal | ICD-10-CM

## 2018-08-16 LAB — LIPID PANEL
Chol/HDL Ratio: 2.4 ratio (ref 0.0–4.4)
Cholesterol, Total: 195 mg/dL (ref 100–199)
HDL: 81 mg/dL (ref >39)
LDL Calculated: 96 mg/dL (ref 0–99)
TRIGLYCERIDES: 90 mg/dL (ref 0–149)
VLDL CHOLESTEROL CAL: 18 mg/dL (ref 5–40)

## 2018-08-16 LAB — HEMOGLOBIN A1C
Est. average glucose Bld gHb Est-mCnc: 186 mg/dL
Hgb A1c MFr Bld: 8.1 % — ABNORMAL HIGH (ref 4.8–5.6)

## 2018-08-19 ENCOUNTER — Encounter: Payer: Self-pay | Admitting: Internal Medicine

## 2018-08-19 NOTE — Progress Notes (Signed)
Subjective:     Patient ID: [name] , female    DOB: [birth year] , 84 y.o.   MRN: [id number]   Chief Complaint  Patient presents with  . Diabetes  . Hypertension    HPI  Diabetes  She presents for her follow-up diabetic visit. She has type 2 diabetes mellitus. There are no hypoglycemic associated symptoms. There are no hypoglycemic complications. Risk factors for coronary artery disease include diabetes mellitus, dyslipidemia, post-menopausal, sedentary lifestyle and hypertension.  Hypertension  This is a chronic problem. The current episode started more than 1 year ago. The problem is uncontrolled.   She reports compliance with meds.   Past Medical History:  Diagnosis Date  . Anemia   . Arthritis   . Cataract   . Chronic diastolic CHF (congestive heart failure) ([place])   . CKD (chronic kidney disease), stage III ([place])   . Clotting disorder ([place])   . Diabetes mellitus ([place])   . Dizziness and giddiness   . DVT (deep venous thrombosis) ([place])    a. On Coumadin for this.  . Essential hypertension   . LBBB (left bundle branch block)   . Mitral regurgitation    a. Mod MR by echo 2014.  . Muscular deconditioning   . Obesity   . SVT (supraventricular tachycardia) ([place])    a. In 2013 she had an EPS with ablation for SVT which did not eliminate the SVT completely. She also had bradycardia which limited medication. She was placed on amiodarone by Dr. Lovena Le.  . Thrombocytopenia ([place]) 11/21/2011      Current Outpatient Medications:  .  ACCU-CHEK AVIVA PLUS test strip, USE AS DIRECTED TO CHECK BLOOD SUGAR QID, Disp: , Rfl: 11 .  acetaminophen (TYLENOL) 500 MG tablet, Take 1,000 mg by mouth every 6 (six) hours as needed., Disp: , Rfl:  .  amiodarone (PACERONE) 200 MG tablet, TAKE 1 TABLET BY MOUTH DAILY, Disp: 90 tablet, Rfl: 3 .  atorvastatin (LIPITOR) 20 MG tablet, Take 20 mg by mouth daily., Disp: , Rfl:  .  BD PEN NEEDLE NANO U/F 32G X 4 MM MISC, U UTD QID FOR 30 DAYS, Disp: ,  Rfl: 0 .  calcium carbonate (OS-CAL) 600 MG TABS, Take 600 mg by mouth daily with breakfast. , Disp: , Rfl:  .  Cyanocobalamin (VITAMIN B 12 PO), Take 1 tablet daily by mouth., Disp: , Rfl:  .  ferrous gluconate (FERGON) 324 MG tablet, Take 325 mg by mouth daily with breakfast. , Disp: , Rfl:  .  furosemide (LASIX) 80 MG tablet, Take 1 tablet (80 mg total) by mouth 2 (two) times daily., Disp: 60 tablet, Rfl: 9 .  gabapentin (NEURONTIN) 300 MG capsule, Take 300 mg by mouth at bedtime. , Disp: , Rfl:  .  HUMALOG KWIKPEN 200 UNIT/ML SOPN, Inject into the skin 3 (three) times daily. AS DIRECTED, Disp: , Rfl: 3 .  LEVEMIR FLEXTOUCH 100 UNIT/ML Pen, Inject 12 units in the am and 12 units at night into skin as directed, Disp: , Rfl: 4 .  losartan (COZAAR) 100 MG tablet, TAKE 1/2 TABLET(50 MG) BY MOUTH DAILY, Disp: 45 tablet, Rfl: 2 .  magnesium oxide (MAG-OX) 400 MG tablet, Take 400 mg by mouth daily., Disp: , Rfl:  .  metoprolol succinate (TOPROL-XL) 25 MG 24 hr tablet, Take 1 tablet (25 mg total) by mouth daily. Take with or immediately following a meal., Disp: 90 tablet, Rfl: 3 .  omeprazole (PRILOSEC) 40 MG capsule, Take  40 mg by mouth daily., Disp: , Rfl:  .  oxybutynin (DITROPAN) 5 MG tablet, TAKE 1 TABLET BY MOUTH TWICE DAILY, Disp: 180 tablet, Rfl: 0 .  potassium chloride (MICRO-K) 10 MEQ CR capsule, Take 4 tablets by mouth X's 1 day then go back to 2 tablets by mouth daily, Disp: 180 capsule, Rfl: 1 .  pregabalin (LYRICA) 75 MG capsule, Take 75 mg by mouth at bedtime., Disp: , Rfl:  .  Vitamin D, Ergocalciferol, (DRISDOL) 50000 UNITS CAPS, Take 5,000 Units by mouth 2 (two) times a week. , Disp: , Rfl:  .  warfarin (COUMADIN) 7.5 MG tablet, Take 7.5 mg by mouth daily. Takes 7.5 mg Monday through Friday, and a half tablet Saturday Sunday., Disp: , Rfl:    Allergies  Allergen Reactions  . Aspirin Itching  . Penicillins Itching and Rash     Review of Systems  Constitutional: Negative.   Eyes:  Negative.   Respiratory: Negative.   Cardiovascular: Negative.   Gastrointestinal: Negative.   Genitourinary: Negative.   Neurological: Negative.   Psychiatric/Behavioral: Negative.      Today's Vitals   08/15/18 1121  BP: 140/80  Pulse: 68  Temp: 98.3 F (36.8 C)  TempSrc: Oral  SpO2: 97%  Weight: 195 lb 9.6 oz (88.7 kg)  Height: 5\' 5"  (1.651 m)   Body mass index is 32.55 kg/m.   Objective:  Physical Exam  Constitutional: She is oriented to person, place, and time. She appears well-developed and well-nourished.  HENT:  Head: Normocephalic and atraumatic.  Cardiovascular: Normal rate, regular rhythm and normal heart sounds.  Pulmonary/Chest: Effort normal and breath sounds normal.  Musculoskeletal: Edema: B/L le EDEMA   Neurological: She is alert and oriented to person, place, and time.  Psychiatric: She has a normal mood and affect.  Nursing note and vitals reviewed.       Assessment And Plan:     1. Type 2 diabetes mellitus with stage 4 chronic kidney disease, with long-term current use of insulin (HCC)  I WILL CHECK LABS AS LISTED BELOW. IMPORTANCE OF DIETARY COMPLIANCE WAS DISCUSSED WITH THE PATIENT.   - Lipid Profile - Hemoglobin A1c  2. Chronic renal disease, stage III (HCC)  CHRONIC, YET STABLE. I WILL CHECK GFR, CR TODAY.   3. Hypertensive heart and renal disease with heart failure (HCC)  FAIR CONTROL. SHE WILL CONTINUE WITH CURRENT MEDS. SHE IS ENCOURAGED TO LIMIT HER SALT INTAKE.  - Lipid Profile  4. Localized osteoarthritis of right knee  CHRONIC. SHE IS FOLLOWED BY ORTHO. SHE WILL CONTINUE TO WEAR KNEE BRACE ON OCCASION AS NEEDED.   5. Needs flu shot SHE WAS GIVEN HIGH DOSE FLU VACCINE.   6. Encounter for monitoring Coumadin therapy I WILL CHECK PT/INR TODAY.    Maximino Greenland, MD

## 2018-08-21 ENCOUNTER — Ambulatory Visit: Payer: Medicare Other | Admitting: Cardiology

## 2018-08-21 LAB — POCT INR: INR: 2.4 — AB (ref 0.9–1.1)

## 2018-08-30 LAB — POCT INR: INR: 3 — AB (ref 0.9–1.1)

## 2018-09-02 ENCOUNTER — Other Ambulatory Visit: Payer: Self-pay | Admitting: Internal Medicine

## 2018-09-06 LAB — POCT INR: INR: 2.7 — AB (ref 0.9–1.1)

## 2018-09-07 ENCOUNTER — Encounter: Payer: Self-pay | Admitting: Internal Medicine

## 2018-09-11 ENCOUNTER — Ambulatory Visit: Payer: Self-pay | Admitting: *Deleted

## 2018-09-13 LAB — POCT INR
INR: 4.3 — AB (ref 0.9–1.1)
INR: 5.2 — AB (ref 0.9–1.1)

## 2018-09-14 ENCOUNTER — Ambulatory Visit: Payer: Medicare Other | Admitting: Cardiology

## 2018-09-14 ENCOUNTER — Encounter: Payer: Self-pay | Admitting: Cardiology

## 2018-09-14 VITALS — BP 162/84 | HR 57 | Ht 65.0 in | Wt 190.4 lb

## 2018-09-14 DIAGNOSIS — I5032 Chronic diastolic (congestive) heart failure: Secondary | ICD-10-CM | POA: Diagnosis not present

## 2018-09-14 MED ORDER — CARVEDILOL 12.5 MG PO TABS: 12.5000 mg | ORAL_TABLET | Freq: Two times a day (BID) | ORAL | 3 refills | Status: DC

## 2018-09-14 NOTE — Patient Instructions (Addendum)
Your physician has recommended you make the following change in your medication:  STOP METOPROLOL START CARVEDILOL 12.5 MG TWICE DAILY  Your physician recommends that you schedule a follow-up appointment in: [place] B/P CHECK   Your physician wants you to follow-up in: [place] will receive a reminder letter in the mail two months in advance. If you don't receive a letter, please call our office to schedule the follow-up appointment.  ELEVATE LEGS AS MUCH AS POSSIBLE AND ALSO WEAR COMPRESSION STOCKINGS

## 2018-09-14 NOTE — Progress Notes (Signed)
09/14/2018 [name]   09-16-1933  [id number]  Primary Physician [name], MD Primary Cardiologist: Dr. Irish Lack   Reason for Visit/CC: Chronic Diastolic HF. Chronic LEE  HPI:  84 y.o. female with history of h/o DVT (on Coumadin for this), chronic diastolic CHF, deconditioning, obesity, HTN, SVT (correlated with pre-syncope, on amiodarone), IDDM, chronic anemia/thrombocytopenia, LBBB, probable CKD stage III (Cr 1.3 in 08/2014), mod MR by echo 2014 who presents for f/u. Per notes in 2013 she had an EPS with ablation for SVT which did not eliminate the SVT completely. She also had bradycardia which limited medication. She was placed on amiodarone by Dr. Lovena Le. Last echo 09/2017: Low normal to mild global reduction in LV systolic function (EF 50); mild LVH with proximal septal thickening; mild MR; severe LAE; mild TR with severe pulmonary hypertension.  Seen 11/05/2016 by Dr. Irish Lack. At that time the patient had been noted to be having some possible TIA symptoms. She underwent carotid Dopplers that showed minimal plaque, no significant disease as well as echocardiogram which showed normal LV function and no obvious source of embolus. A brain MRI on 11/17/2017 showed no active disease. She says that she was told by neurology that she did have a stroke that effected her speech and somewhat her swallowing.  Seen by Daune Perch, NP 04/04/18. Had DOE and chest pain. Stress test low risk. Had normal ABI.  Last OV was with Gilberto Better, PA, 06/2018. She complained of bilateral LEE and dyspnea. BNP was 1500. She was instructed to increase Lasix to 80 mg BID. She had repeat labs 08/06/18. BNP had further increased to 1600. At the time, pt reported feeling better. Dr. Irish Lack was asked to weigh in and stated that if she was feeling better, he would not recommend RHC.   Since her last cardiology visit, the patient was seen by her nephrologist Dr. Clover Mealy  The patient continued to have chronic  edema with but was overall feeling better and noted a 14 pound weight loss.  Her renal function was stable.  Nephrologist recommended that she continue on 80 mg twice daily daily.  She presents back to clinic today with her daughter.  She notes slight improvement but continues to have chronic lower extremity edema.  She does not wear compression stockings.  She reports good urine output with Lasix twice daily and reports full compliance.  She does have chronic venous stasis dermatitis on both legs. No ulcers. 2+ edema. Non wheeping.  She reports that her breathing is stable.  She does get short of breath with ambulation which she reports is her baseline.  It has not worsened.  She denies any resting dyspnea.  She sleeps with one pillow.  She denies any orthopnea or PND.  Her blood pressure is elevated in clinic today at 162/84.  Her heart rate is 57 bpm.  She took all of her morning medications today.  Current Meds  Medication Sig  . ACCU-CHEK AVIVA PLUS test strip USE AS DIRECTED TO CHECK BLOOD SUGAR QID  . acetaminophen (TYLENOL) 500 MG tablet Take 1,000 mg by mouth every 6 (six) hours as needed.  [name] amiodarone (PACERONE) 200 MG tablet TAKE 1 TABLET BY MOUTH DAILY  . atorvastatin (LIPITOR) 20 MG tablet Take 20 mg by mouth daily.  . BD PEN NEEDLE NANO U/F 32G X 4 MM MISC U UTD QID FOR 30 DAYS  . calcium carbonate (OS-CAL) 600 MG TABS Take 600 mg by mouth daily with breakfast.   .  COUMADIN 7.5 MG tablet TAKE 1 TABLET BY MOUTH ONCE DAILY ON TUESDAY, THURSDAY AND SATURDAY THEN 1/2 TABLET ON MONDAY, WEDNESDAY, FRIDAY, AND SUNDAY  . Cyanocobalamin (VITAMIN B 12 PO) Take 1 tablet daily by mouth.  . ferrous gluconate (FERGON) 324 MG tablet Take 325 mg by mouth daily with breakfast.   . furosemide (LASIX) 80 MG tablet Take 1 tablet (80 mg total) by mouth 2 (two) times daily.  [name] HUMALOG KWIKPEN 200 UNIT/ML SOPN Inject into the skin 3 (three) times daily. AS DIRECTED  . LEVEMIR FLEXTOUCH 100 UNIT/ML Pen  Inject 12 units in the am and 12 units at night into skin as directed  . losartan (COZAAR) 100 MG tablet TAKE 1/2 TABLET(50 MG) BY MOUTH DAILY  . magnesium oxide (MAG-OX) 400 MG tablet Take 400 mg by mouth daily.  [name] omeprazole (PRILOSEC) 40 MG capsule Take 40 mg by mouth daily.  [name] oxybutynin (DITROPAN) 5 MG tablet TAKE 1 TABLET BY MOUTH TWICE DAILY  . potassium chloride (MICRO-K) 10 MEQ CR capsule Take 4 tablets by mouth X's 1 day then go back to 2 tablets by mouth daily  . pregabalin (LYRICA) 75 MG capsule Take 75 mg by mouth at bedtime.  . [DISCONTINUED] metoprolol succinate (TOPROL-XL) 25 MG 24 hr tablet Take 1 tablet (25 mg total) by mouth daily. Take with or immediately following a meal.   Allergies  Allergen Reactions  . Aspirin Itching  . Penicillins Itching and Rash   Past Medical History:  Diagnosis Date  . Anemia   . Arthritis   . Cataract   . Chronic diastolic CHF (congestive heart failure) ([place])   . CKD (chronic kidney disease), stage III ([place])   . Clotting disorder ([place])   . Diabetes mellitus ([place])   . Dizziness and giddiness   . DVT (deep venous thrombosis) ([place])    a. On Coumadin for this.  . Essential hypertension   . LBBB (left bundle branch block)   . Mitral regurgitation    a. Mod MR by echo 2014.  . Muscular deconditioning   . Obesity   . SVT (supraventricular tachycardia) ([place])    a. In 2013 she had an EPS with ablation for SVT which did not eliminate the SVT completely. She also had bradycardia which limited medication. She was placed on amiodarone by Dr. Lovena Le.  . Thrombocytopenia ([place]) 11/21/2011   Family History  Problem Relation Age of Onset  . Stroke Mother   . Cancer Father   . Hypertension Daughter   . Heart attack Brother        x2  . Hypertension Brother   . Stroke Sister   . Hypertension Sister    Past Surgical History:  Procedure Laterality Date  . EYE SURGERY    . SUPRAVENTRICULAR TACHYCARDIA ABLATION N/A 10/11/2012   Procedure:  SUPRAVENTRICULAR TACHYCARDIA ABLATION;  Surgeon: Evans Lance, MD;  Location: [facility] CATH [facility];  Service: Cardiovascular;  Laterality: N/A;   Social History   Socioeconomic History  . Marital status: Widowed    Spouse name: Not on file  . Number of children: Not on file  . Years of education: Not on file  . Highest education level: Not on file  Occupational History  . Not on file  Social Needs  . Financial resource strain: Not on file  . Food insecurity:    Worry: Not on file    Inability: Not on file  . Transportation needs:    Medical: No  Non-medical: No  Tobacco Use  . Smoking status: Never Smoker  . Smokeless tobacco: Never Used  Substance and Sexual Activity  . Alcohol use: No  . Drug use: No  . Sexual activity: Not on file  Lifestyle  . Physical activity:    Days per week: Not on file    Minutes per session: Not on file  . Stress: Not on file  Relationships  . Social connections:    Talks on phone: Not on file    Gets together: Not on file    Attends religious service: Not on file    Active member of club or organization: Not on file    Attends meetings of clubs or organizations: Not on file    Relationship status: Not on file  . Intimate partner violence:    Fear of current or ex partner: Not on file    Emotionally abused: Not on file    Physically abused: Not on file    Forced sexual activity: Not on file  Other Topics Concern  . Not on file  Social History Narrative   Lives at home alone   She has a nursing aid who comes to her home for 2.5 hrs/day x 4 days/week    Right handed   1 cup caffeine daily     Review of Systems: General: negative for chills, fever, night sweats or weight changes.  Cardiovascular: negative for chest pain, dyspnea on exertion, edema, orthopnea, palpitations, paroxysmal nocturnal dyspnea or shortness of breath Dermatological: negative for rash Respiratory: negative for cough or wheezing Urologic: negative for  hematuria Abdominal: negative for nausea, vomiting, diarrhea, bright red blood per rectum, melena, or hematemesis Neurologic: negative for visual changes, syncope, or dizziness All other systems reviewed and are otherwise negative except as noted above.   Physical Exam:  Blood pressure (!) 162/84, pulse (!) 57, height 5\' 5"  (1.651 m), weight 190 lb 6.4 oz (86.4 kg), SpO2 96 %.  General appearance: alert, cooperative and no distress Neck: no carotid bruit and no JVD Lungs: clear to auscultation bilaterally Heart: regular rate and rhythm, S1, S2 normal, no murmur, click, rub or gallop Extremities: venous stasis dermatitis noted and chronic LEE bilateralling 2+ Pulses: 2+ and symmetric Skin: Skin color, texture, turgor normal. No rashes or lesions Neurologic: Grossly normal  EKG not performed -- personally reviewed   ASSESSMENT AND PLAN:   1. Chronic Diastolic HF: No dyspnea. Chronic LEE present. Continue diuretics, 80 mg twice daily.  She needs better blood pressure control.  Blood pressure today is 162/84.  Heart rate is 57 bpm.  She has taken all of her medications today.  We will change her beta-blocker from metoprolol to carvedilol for better control of BP.  Stop metoprolol and start carvedilol 12.5 twice daily.  I recommend she come back for a repeat blood pressure check with RN in 1 to 2 weeks however her daughter would prefer to have this done at her PCP office.  I feel that this is reasonable. We discussed low salt diet.   2. Chronic bilateral lower extremity edema with chronic venous stasis dermatitis: I feel this is multifactorial in the setting of chronic diastolic heart failure and chronic venous insufficiency.  She does have a prior history of DVT but is now on Coumadin and reports full medication compliance.  She did have recent bilateral lower extremity venous Dopplers which were negative for DVT.  She does not wear compression stockings and I feel that she would benefit from  this.  I have written Rx for patient to get medical compression stockings to help with edema.  Also encouraged that she elevate her legs and feet while resting at home.  Continue diuretics.  Her nephrologist has agreed that she could should continue on 80 mg Lasix twice daily.  3. Chronic kidney disease:  followed by nephrology  4. Hypertension: As outlined above, we will change her beta-blocker from metoprolol to carvedilol for better BP control.  Starting carvedilol 12.5 mg twice daily.  Patient will have blood pressure follow-up at PCP office, at daughter's request.   Follow-Up w/ Dr. Irish Lack in 6 months.   Alyvia Derk Ladoris Gene, MHS [facility] 09/14/2018 12:57 PM

## 2018-09-19 ENCOUNTER — Other Ambulatory Visit: Payer: Self-pay | Admitting: *Deleted

## 2018-09-19 NOTE — Patient Outreach (Signed)
[name]) Care Management  09/19/2018  [name] 06-24-1933 [id number]   RN Health Coach Monthly Outreach  Referral Date:11/22/2016 Referral Source:[facility] Screening Reason for Referral:Disease Management/Education Insurance:[employer] Medicare   Outreach Attempt:  Outreach attempt #1 to patient for monthly follow up.  Female answered and stated patient not at home.  HIPAA compliant voice message left.  Plan:  RN Health Coach will make another outreach attempt within the next 10 business days.  [place] [phone] [email or web address]

## 2018-09-21 ENCOUNTER — Other Ambulatory Visit: Payer: Self-pay | Admitting: Internal Medicine

## 2018-09-21 ENCOUNTER — Encounter: Payer: Self-pay | Admitting: *Deleted

## 2018-09-21 ENCOUNTER — Other Ambulatory Visit: Payer: Self-pay | Admitting: *Deleted

## 2018-09-21 NOTE — Patient Outreach (Signed)
[facility]) Care Management  [employer]  09/21/2018   [name] August 18, 1933 [id number]   RN Health Coach Monthly Outreach   Referral Date: 11/22/2016 Referral Source: [facility] Screening Reason for Referral: Disease Management/Education Insurance: [employer]     Outreach Attempt: Successful telephone outreach to patient for monthly follow up.  HIPAA verified with patient.  Patient stating she is doing well.  Unable to check blood sugar this morning due to not having test strips and pharmacy not refilling until today.  Patient reporting about 2 episodes of hypoglycemia related to forgetting to eat after taking insulin.  Encouraged patient to eat meals throughout the day.  Stating she has not weighed today due to waking up late, but weights have remained within the range of 188-190 pounds.  Denies any shortness of breath and any increase in lower extremity edema.  Encounter Medications:  Outpatient Encounter Medications as of 09/21/2018  Medication Sig Note  . ACCU-CHEK AVIVA PLUS test strip USE AS DIRECTED TO CHECK BLOOD SUGAR QID   . acetaminophen (TYLENOL) 500 MG tablet Take 1,000 mg by mouth every 6 (six) hours as needed.   [name] amiodarone (PACERONE) 200 MG tablet TAKE 1 TABLET BY MOUTH DAILY   . atorvastatin (LIPITOR) 20 MG tablet Take 20 mg by mouth daily.   . BD PEN NEEDLE NANO U/F 32G X 4 MM MISC U UTD QID FOR 30 DAYS   . calcium carbonate (OS-CAL) 600 MG TABS Take 600 mg by mouth daily with breakfast.    . carvedilol (COREG) 12.5 MG tablet Take 1 tablet (12.5 mg total) by mouth 2 (two) times daily.   [name] COUMADIN 7.5 MG tablet TAKE 1 TABLET BY MOUTH ONCE DAILY ON TUESDAY, THURSDAY AND SATURDAY THEN 1/2 TABLET ON MONDAY, WEDNESDAY, FRIDAY, AND SUNDAY   . Cyanocobalamin (VITAMIN B 12 PO) Take 1 tablet daily by mouth.   . ferrous gluconate (FERGON) 324 MG tablet Take 325 mg by mouth daily with breakfast.    . furosemide (LASIX) 80 MG tablet Take 1  tablet (80 mg total) by mouth 2 (two) times daily.   [name] HUMALOG KWIKPEN 200 UNIT/ML SOPN Inject into the skin 3 (three) times daily. AS DIRECTED   . LEVEMIR FLEXTOUCH 100 UNIT/ML Pen Inject 12 units in the am and 12 units at night into skin as directed 09/21/2018: Reports taking 16 units in the am and 10 units in the evening  . losartan (COZAAR) 100 MG tablet TAKE 1/2 TABLET(50 MG) BY MOUTH DAILY   . magnesium oxide (MAG-OX) 400 MG tablet Take 400 mg by mouth daily.   [name] omeprazole (PRILOSEC) 40 MG capsule Take 40 mg by mouth daily.   [name] oxybutynin (DITROPAN) 5 MG tablet TAKE 1 TABLET BY MOUTH TWICE DAILY   . potassium chloride (MICRO-K) 10 MEQ CR capsule Take 4 tablets by mouth X's 1 day then go back to 2 tablets by mouth daily   . pregabalin (LYRICA) 75 MG capsule Take 75 mg by mouth at bedtime.    No facility-administered encounter medications on file as of 09/21/2018.     Functional Status:  In your present state of health, do you have any difficulty performing the following activities: 08/13/2018 01/15/2018  Hearing? N N  Vision? N N  Difficulty concentrating or making decisions? N N  Walking or climbing stairs? Y Y  Dressing or bathing? Tempie Donning  Comment aide assist with baths -  Doing errands, shopping? Tempie Donning  Preparing  Food and eating ? Y Y  Using the Toilet? N N  In the past six months, have you accidently leaked urine? Y N  Do you have problems with loss of bowel control? N N  Managing your Medications? Y Y  Comment - -  Managing your Finances? N Y  Housekeeping or managing your Housekeeping? Y Y  Some recent data might be hidden    Fall/Depression Screening: Fall Risk  09/21/2018 08/15/2018 08/13/2018  Falls in the past year? (No Data) No Yes  Comment denies falls in the last 4 months - no falls in the last few months  Number falls in past yr: 1 - 1  Injury with Fall? 0 - No  Risk for fall due to : History of fall(s);Impaired vision;Medication side effect;Impaired  balance/gait;Impaired mobility - Impaired vision;Impaired balance/gait;Medication side effect;Impaired mobility;History of fall(s)  Follow up Education provided;Falls prevention discussed;Falls evaluation completed - Falls prevention discussed;Education provided   [facility] 2/9 Scores 08/15/2018 08/13/2018 01/15/2018 09/01/2017 08/10/2017 05/19/2017 11/29/2016  PHQ - 2 Score 0 0 0 0 0 0 0    THN CM Care Plan Problem One     Most Recent Value  Care Plan Problem One  Knowledge deficiet related to importance of medical self care management of diabetes and congestive heart failure.  Role Documenting the Problem One  [place] for Problem One  Active  [facility] Long Term Goal   Patient will report a decrease in her A1C by 0.5 point in the next 90 days.  THN Long Term Goal Start Date  09/21/18  Interventions for Problem One Long Term Goal  Current care plan and goals reviewed and discussed with patient, discussed current Hgb A1C value and ways to help reduce this, reviewed medications and encouraged medication compliance, encouraged to keep and attend medical appointments, encouraged low sodium diet as well as low carbohydrate diet, encouraged continued daily weighing  THN CM Short Term Goal #1   Patient will reschedule and attend missed Endocrinology appointment within the next 30 days.  THN CM Short Term Goal #1 Start Date  09/21/18  Interventions for Short Term Goal #1  discussed endocrinology missed appointment, encouraged patient to continue to call office to try and get fitted in for new appointment, encouraged patient to attend appointment when rescheduled  [facility] CM Short Term Goal #2   Patient will verbalize 2 food changes to help reduce A1C in the next 60 days.  THN CM Short Term Goal #2 Start Date  09/21/18  Interventions for Short Term Goal #2  Encouraged patient to eat more vegetables and decrease carbohydrate intake, discussed healthier meal and drink options,      Appointments:  Attended  appointment with [name] on 08/15/2018 and has scheduled follow up appointment on 10/10/2018.  Attended appointment with Cardiology on 09/14/2018.  Still needs to reschedule Endocrinology appointment with Dr. Chalmers Cater, encouraged to do so as soon as possible.  Plan: RN Health Coach will send primary care provider Quarterly Update. RN Health Coach will make next telephone outreach in the month of January.  [place] [phone] [email or web address]

## 2018-09-23 DIAGNOSIS — Z7901 Long term (current) use of anticoagulants: Secondary | ICD-10-CM | POA: Diagnosis not present

## 2018-09-24 ENCOUNTER — Telehealth: Payer: Self-pay

## 2018-09-24 NOTE — Telephone Encounter (Signed)
The pt was told that to hold her coumadin for today because her INR is 5.2 and to eat some greens.

## 2018-09-27 LAB — POCT INR: INR: 3.1 — AB (ref 0.9–1.1)

## 2018-09-28 ENCOUNTER — Telehealth: Payer: Self-pay

## 2018-09-28 NOTE — Telephone Encounter (Signed)
The pt called and left a message that her INR today is 3.1.

## 2018-10-04 ENCOUNTER — Other Ambulatory Visit: Payer: Self-pay | Admitting: Internal Medicine

## 2018-10-05 ENCOUNTER — Other Ambulatory Visit: Payer: Self-pay | Admitting: *Deleted

## 2018-10-05 LAB — POCT INR: INR: 4.1 — AB (ref 0.9–1.1)

## 2018-10-05 MED ORDER — AMIODARONE HCL 200 MG PO TABS: 200.0000 mg | ORAL_TABLET | Freq: Every day | ORAL | 2 refills | Status: DC

## 2018-10-10 ENCOUNTER — Ambulatory Visit (INDEPENDENT_AMBULATORY_CARE_PROVIDER_SITE_OTHER): Payer: Medicare Other | Admitting: Internal Medicine

## 2018-10-10 ENCOUNTER — Encounter: Payer: Self-pay | Admitting: Internal Medicine

## 2018-10-10 VITALS — BP 130/70 | HR 58 | Temp 97.6°F | Ht 65.0 in | Wt 191.2 lb

## 2018-10-10 DIAGNOSIS — I10 Essential (primary) hypertension: Secondary | ICD-10-CM

## 2018-10-10 DIAGNOSIS — Z8673 Personal history of transient ischemic attack (TIA), and cerebral infarction without residual deficits: Secondary | ICD-10-CM | POA: Diagnosis not present

## 2018-10-10 DIAGNOSIS — E1165 Type 2 diabetes mellitus with hyperglycemia: Secondary | ICD-10-CM

## 2018-10-10 DIAGNOSIS — N6310 Unspecified lump in the right breast, unspecified quadrant: Secondary | ICD-10-CM | POA: Diagnosis not present

## 2018-10-10 DIAGNOSIS — N63 Unspecified lump in unspecified breast: Secondary | ICD-10-CM

## 2018-10-10 NOTE — Progress Notes (Signed)
Subjective:     Patient ID: [name] , female    DOB: 02-02-1933 , 84 y.o.   MRN: [id number]   Chief Complaint  Patient presents with  . Hypertension  . Breast Pain    found a lump on R breast when showering.    HPI 1- Pt is here for HTN & DM FU. She does not check her BP at home.      She sees endocrinologist. Glucose has been from 63-200. She admits she drank sweet tea and coolid with her    dinner.   2- Found a R breast lump when she was showering last week. It is not painful   Has apt with [employer] [facility] RN at her home next month. Past Medical History:  Diagnosis Date  . Anemia   . Arthritis   . Cataract   . Chronic diastolic CHF (congestive heart failure) ([place])   . CKD (chronic kidney disease), stage III ([place])   . Clotting disorder ([place])   . Diabetes mellitus ([place])   . Dizziness and giddiness   . DVT (deep venous thrombosis) ([place])    a. On Coumadin for this.  . Essential hypertension   . LBBB (left bundle branch block)   . Mitral regurgitation    a. Mod MR by echo 2014.  . Muscular deconditioning   . Obesity   . SVT (supraventricular tachycardia) ([place])    a. In 2013 she had an EPS with ablation for SVT which did not eliminate the SVT completely. She also had bradycardia which limited medication. She was placed on amiodarone by Dr. Lovena Le.  . Thrombocytopenia ([place]) 11/21/2011     Family History  Problem Relation Age of Onset  . Stroke Mother   . Cancer Father   . Hypertension Daughter   . Heart attack Brother        x2  . Hypertension Brother   . Stroke Sister   . Hypertension Sister     Current Outpatient Medications:  .  ACCU-CHEK AVIVA PLUS test strip, USE AS DIRECTED TO CHECK BLOOD SUGAR QID, Disp: , Rfl: 11 .  acetaminophen (TYLENOL) 500 MG tablet, Take 1,000 mg by mouth every 6 (six) hours as needed., Disp: , Rfl:  .  amiodarone (PACERONE) 200 MG tablet, Take 1 tablet (200 mg total) by mouth daily., Disp: 90 tablet, Rfl: 2 .  atorvastatin  (LIPITOR) 20 MG tablet, TAKE 1 TABLET BY MOUTH AT NIGHT, Disp: 90 tablet, Rfl: 0 .  BD PEN NEEDLE NANO U/F 32G X 4 MM MISC, U UTD QID FOR 30 DAYS, Disp: , Rfl: 0 .  calcium carbonate (OS-CAL) 600 MG TABS, Take 600 mg by mouth daily with breakfast. , Disp: , Rfl:  .  carvedilol (COREG) 12.5 MG tablet, Take 1 tablet (12.5 mg total) by mouth 2 (two) times daily., Disp: 180 tablet, Rfl: 3 .  COUMADIN 7.5 MG tablet, TAKE 1 TABLET BY MOUTH ONCE DAILY ON TUESDAY, THURSDAY AND SATURDAY THEN 1/2 TABLET ON MONDAY, WEDNESDAY, FRIDAY, AND SUNDAY, Disp: 30 tablet, Rfl: 0 .  Cyanocobalamin (VITAMIN B 12 PO), Take 1 tablet daily by mouth., Disp: , Rfl:  .  ferrous gluconate (FERGON) 324 MG tablet, Take 325 mg by mouth daily with breakfast. , Disp: , Rfl:  .  furosemide (LASIX) 80 MG tablet, Take 1 tablet (80 mg total) by mouth 2 (two) times daily., Disp: 60 tablet, Rfl: 9 .  HUMALOG KWIKPEN 200 UNIT/ML SOPN, Inject into the skin 3 (three)  times daily. AS DIRECTED, Disp: , Rfl: 3 .  LEVEMIR FLEXTOUCH 100 UNIT/ML Pen, Inject 12 units in the am and 12 units at night into skin as directed, Disp: , Rfl: 4 .  losartan (COZAAR) 100 MG tablet, TAKE 1/2 TABLET(50 MG) BY MOUTH DAILY, Disp: 45 tablet, Rfl: 2 .  magnesium oxide (MAG-OX) 400 MG tablet, Take 400 mg by mouth daily., Disp: , Rfl:  .  omeprazole (PRILOSEC) 40 MG capsule, TAKE ONE CAPSULE BY MOUTH DAILY BEFORE A MEAL, Disp: 90 capsule, Rfl: 5 .  oxybutynin (DITROPAN) 5 MG tablet, TAKE 1 TABLET BY MOUTH TWICE DAILY, Disp: 180 tablet, Rfl: 0 .  potassium chloride (MICRO-K) 10 MEQ CR capsule, Take 4 tablets by mouth X's 1 day then go back to 2 tablets by mouth daily, Disp: 180 capsule, Rfl: 1 .  pregabalin (LYRICA) 75 MG capsule, Take 75 mg by mouth at bedtime., Disp: , Rfl:    Allergies  Allergen Reactions  . Aspirin Itching  . Penicillins Itching and Rash     Review of Systems  Constitutional: Negative for appetite change, chills, diaphoresis and fever.   HENT: Negative for congestion, postnasal drip and rhinorrhea.   Eyes: Negative for visual disturbance.  Respiratory: Negative for choking and shortness of breath.   Cardiovascular: Positive for leg swelling. Negative for chest pain.       Her cardiologist changed her lasix to 80 mg bid one month.   Endocrine: Negative for polydipsia and polyphagia.  Genitourinary: Negative for dysuria.  Musculoskeletal: Positive for gait problem.       Uses a walker to ambulate, and no new problems  Skin: Negative for rash and wound.  Neurological: Positive for speech difficulty and headaches. Negative for dizziness, facial asymmetry, weakness and numbness.       Her daughter noticed her speech is hard to understand and been having L temporal HAs in the past few weeks which wakes her up in the middle of the night,  She called pt's neurologist and was told to make an apt and pt has not made it. Pt does not feel she is worse, and cant tell changes in her speech.   .   Psychiatric/Behavioral: Negative for agitation and confusion.   Today's Vitals   10/10/18 1057  BP: 130/70  Pulse: (!) 58  Temp: 97.6 F (36.4 C)  TempSrc: Oral  SpO2: 96%  Weight: 191 lb 3.2 oz (86.7 kg)  Height: 5\' 5"  (1.651 m)   Body mass index is 31.82 kg/m.   Objective:  Physical Exam  Constitutional: She is oriented to person, place, and time. She appears well-developed and well-nourished. No distress.  HENT:  Head: Normocephalic and atraumatic. No area of tenderness on L scalp where she feels the HA. Face is symmetric.  Right Ear: External ear normal.  Left Ear: External ear normal.  Nose: Nose normal.  Eyes: Conjunctivae are normal. Right eye exhibits no discharge. Left eye exhibits no discharge. No scleral icterus.  Neck: Neck supple. No thyromegaly present.  No carotid bruits bilaterally  Cardiovascular: Normal rate and regular rhythm.  No murmur heard. Pulmonary/Chest: Effort normal and breath sounds normal. No  respiratory distress.  BREAST- has large solid,firm and non movable mass on R lower breast around 12 o'clock, possible size of a tangerine. No axillary nodes noted. L breast is benign.  Musculoskeletal: Normal range of motion. She exhibits +2/4 edema. Lymphadenopathy:    She has no cervical adenopathy.  Neurological: HerShe is alert and oriented  to person, place, and time.  Skin: Skin is warm and dry. Capillary refill takes less than 2 seconds. No rash noted. She is not diaphoretic.  Psychiatric: She has a normal mood and affect. Her behavior is normal. Judgment and thought content normal.  Nursing note reviewed.  Assessment And Plan:    1. Uncontrolled type 2 diabetes mellitus with hyperglycemia ([place])- chronic  - Hemoglobin A1c   We will see her results to see if we need to change her meds, but advised to d/c drinking sweet tea and cool aid.  2. Essential hypertension- stable. May continue same meds.  - CMP14 + Anion Gap - CBC no Diff  3. Breast lump- R breast - MM Digital Diagnostic Unilat R; Future.  4- past hx of CVA- having some speech changes. Advised to call her neurologist today to be seen. If she can see him this week, needs to go to ER.      Cinde Ebert RODRIGUEZ-SOUTHWORTH, PA-C

## 2018-10-10 NOTE — Patient Instructions (Addendum)
   GET A ALTERNATIVE SWEETENER CALLED [facility]EV[facility] INSTEAD OF EQUAL.   CALL YOUR NEUROLOGIST TODAY TO GET IN THIS WEEK, IF THEY CAN SEE YOU THEN GO TO ER TO HAVE HEAD SCAN TO RULE OUT EARLY STROKE( TIA)  CALL YOUR ENDOCRINOLOGIST TO HAVE A FOLLOW UP FOR YOUR DIABETES.   I ORDERED AN URGENT MAMMOGRAM AT   [facility]   [phone]

## 2018-10-11 LAB — CMP14 + ANION GAP
ALT: 21 IU/L (ref 0–32)
ANION GAP: 20 mmol/L — AB (ref 10.0–18.0)
AST: 26 IU/L (ref 0–40)
Albumin/Globulin Ratio: 1.3 (ref 1.2–2.2)
Albumin: 4 g/dL (ref 3.5–4.7)
Alkaline Phosphatase: 89 IU/L (ref 39–117)
BUN / CREAT RATIO: 22 (ref 12–28)
BUN: 34 mg/dL — ABNORMAL HIGH (ref 8–27)
Bilirubin Total: 0.3 mg/dL (ref 0.0–1.2)
CALCIUM: 9.2 mg/dL (ref 8.7–10.3)
CO2: 24 mmol/L (ref 20–29)
CREATININE: 1.52 mg/dL — AB (ref 0.57–1.00)
Chloride: 104 mmol/L (ref 96–106)
GFR, EST AFRICAN AMERICAN: 36 mL/min/{1.73_m2} — AB (ref >59)
GFR, EST NON AFRICAN AMERICAN: 31 mL/min/{1.73_m2} — AB (ref >59)
Globulin, Total: 3.1 g/dL (ref 1.5–4.5)
Glucose: 98 mg/dL (ref 65–99)
Potassium: 4.4 mmol/L (ref 3.5–5.2)
Sodium: 148 mmol/L — ABNORMAL HIGH (ref 134–144)
TOTAL PROTEIN: 7.1 g/dL (ref 6.0–8.5)

## 2018-10-11 LAB — CBC
HEMATOCRIT: 33.1 % — AB (ref 34.0–46.6)
HEMOGLOBIN: 10.6 g/dL — AB (ref 11.1–15.9)
MCH: 29.3 pg (ref 26.6–33.0)
MCHC: 32 g/dL (ref 31.5–35.7)
MCV: 91 fL (ref 79–97)
Platelets: 104 10*3/uL — ABNORMAL LOW (ref 150–450)
RBC: 3.62 x10E6/uL — AB (ref 3.77–5.28)
RDW: 13.6 % (ref 12.3–15.4)
WBC: 3.7 10*3/uL (ref 3.4–10.8)

## 2018-10-11 LAB — HEMOGLOBIN A1C
Est. average glucose Bld gHb Est-mCnc: 183 mg/dL
Hgb A1c MFr Bld: 8 % — ABNORMAL HIGH (ref 4.8–5.6)

## 2018-10-12 LAB — POCT INR: INR: 5.3 — AB (ref 0.9–1.1)

## 2018-10-16 ENCOUNTER — Encounter: Payer: Self-pay | Admitting: Internal Medicine

## 2018-10-16 ENCOUNTER — Telehealth: Payer: Self-pay

## 2018-10-16 MED ORDER — BD PEN NEEDLE NANO U/F 32G X 4 MM MISC: 4 refills | Status: DC

## 2018-10-16 MED ORDER — OXYBUTYNIN CHLORIDE 5 MG PO TABS: 5.0000 mg | ORAL_TABLET | Freq: Two times a day (BID) | ORAL | 1 refills | Status: DC

## 2018-10-16 NOTE — Telephone Encounter (Signed)
The patient was told that [name] wants to know what is going on with the patient because her INR is 5.6. The pt said that she doesn't know what happened and that when she took her coumadin a doctor from the coumadin clinic called her and said that she needed to skip her coumadin for Friday Saturday and Sunday and to eat some greens.  The patient said that she couldn't remember the doctor's name.  The pt was told that [name] said the pt was told right and to get the name of the doctor to the office so that we can note the name in the patient's chart.

## 2018-10-18 ENCOUNTER — Other Ambulatory Visit: Payer: Self-pay | Admitting: Internal Medicine

## 2018-10-18 DIAGNOSIS — N63 Unspecified lump in unspecified breast: Secondary | ICD-10-CM

## 2018-10-21 LAB — POCT INR: INR: 2 — AB (ref 0.9–1.1)

## 2018-10-24 DIAGNOSIS — C50919 Malignant neoplasm of unspecified site of unspecified female breast: Secondary | ICD-10-CM

## 2018-10-24 HISTORY — DX: Malignant neoplasm of unspecified site of unspecified female breast: C50.919

## 2018-10-25 ENCOUNTER — Other Ambulatory Visit: Payer: Self-pay | Admitting: Internal Medicine

## 2018-10-26 LAB — POCT INR: INR: 1.7 — AB (ref 0.9–1.1)

## 2018-10-30 ENCOUNTER — Other Ambulatory Visit: Payer: Self-pay | Admitting: Internal Medicine

## 2018-10-30 ENCOUNTER — Ambulatory Visit
Admission: RE | Admit: 2018-10-30 | Discharge: 2018-10-30 | Disposition: A | Discharge status: Home / Self Care | Payer: Medicare Other | Source: Ambulatory Visit | Attending: Internal Medicine | Admitting: Internal Medicine

## 2018-10-30 DIAGNOSIS — N6310 Unspecified lump in the right breast, unspecified quadrant: Secondary | ICD-10-CM

## 2018-10-30 DIAGNOSIS — N6314 Unspecified lump in the right breast, lower inner quadrant: Secondary | ICD-10-CM | POA: Diagnosis not present

## 2018-10-30 DIAGNOSIS — N63 Unspecified lump in unspecified breast: Secondary | ICD-10-CM

## 2018-10-30 DIAGNOSIS — R928 Other abnormal and inconclusive findings on diagnostic imaging of breast: Secondary | ICD-10-CM | POA: Diagnosis not present

## 2018-10-30 MED ORDER — DICLOFENAC SODIUM 1 % TD GEL: 2.0000 g | Freq: Four times a day (QID) | TRANSDERMAL | 1 refills | Status: DC

## 2018-10-30 NOTE — Progress Notes (Signed)
Pt  Requesting Voltaren gel for hip pain.

## 2018-11-02 DIAGNOSIS — Z7901 Long term (current) use of anticoagulants: Secondary | ICD-10-CM | POA: Diagnosis not present

## 2018-11-02 LAB — POCT INR: INR: 2.2 — AB (ref 0.9–1.1)

## 2018-11-06 ENCOUNTER — Emergency Department (HOSPITAL_COMMUNITY): Payer: Medicare Other

## 2018-11-06 ENCOUNTER — Encounter: Payer: Self-pay | Admitting: Internal Medicine

## 2018-11-06 ENCOUNTER — Encounter (HOSPITAL_COMMUNITY): Payer: Self-pay | Admitting: Emergency Medicine

## 2018-11-06 ENCOUNTER — Observation Stay (HOSPITAL_COMMUNITY)
Admission: EM | Admit: 2018-11-06 | Discharge: 2018-11-09 | Disposition: A | Discharge status: Skilled Nursing Facility | Payer: Medicare Other | Attending: Internal Medicine | Admitting: Internal Medicine

## 2018-11-06 ENCOUNTER — Other Ambulatory Visit: Payer: Medicare Other

## 2018-11-06 DIAGNOSIS — S7011XA Contusion of right thigh, initial encounter: Secondary | ICD-10-CM

## 2018-11-06 DIAGNOSIS — N6310 Unspecified lump in the right breast, unspecified quadrant: Secondary | ICD-10-CM | POA: Diagnosis not present

## 2018-11-06 DIAGNOSIS — E785 Hyperlipidemia, unspecified: Secondary | ICD-10-CM | POA: Insufficient documentation

## 2018-11-06 DIAGNOSIS — D619 Aplastic anemia, unspecified: Secondary | ICD-10-CM | POA: Diagnosis not present

## 2018-11-06 DIAGNOSIS — I480 Paroxysmal atrial fibrillation: Secondary | ICD-10-CM | POA: Insufficient documentation

## 2018-11-06 DIAGNOSIS — N183 Chronic kidney disease, stage 3 unspecified: Secondary | ICD-10-CM | POA: Diagnosis present

## 2018-11-06 DIAGNOSIS — I471 Supraventricular tachycardia: Secondary | ICD-10-CM | POA: Insufficient documentation

## 2018-11-06 DIAGNOSIS — I10 Essential (primary) hypertension: Secondary | ICD-10-CM | POA: Diagnosis not present

## 2018-11-06 DIAGNOSIS — I5032 Chronic diastolic (congestive) heart failure: Secondary | ICD-10-CM | POA: Insufficient documentation

## 2018-11-06 DIAGNOSIS — S76011A Strain of muscle, fascia and tendon of right hip, initial encounter: Principal | ICD-10-CM | POA: Insufficient documentation

## 2018-11-06 DIAGNOSIS — R4020 Unspecified coma: Secondary | ICD-10-CM | POA: Diagnosis not present

## 2018-11-06 DIAGNOSIS — T45515A Adverse effect of anticoagulants, initial encounter: Secondary | ICD-10-CM

## 2018-11-06 DIAGNOSIS — E1169 Type 2 diabetes mellitus with other specified complication: Secondary | ICD-10-CM

## 2018-11-06 DIAGNOSIS — M25551 Pain in right hip: Secondary | ICD-10-CM

## 2018-11-06 DIAGNOSIS — E1121 Type 2 diabetes mellitus with diabetic nephropathy: Secondary | ICD-10-CM

## 2018-11-06 DIAGNOSIS — E669 Obesity, unspecified: Secondary | ICD-10-CM | POA: Insufficient documentation

## 2018-11-06 DIAGNOSIS — I130 Hypertensive heart and chronic kidney disease with heart failure and stage 1 through stage 4 chronic kidney disease, or unspecified chronic kidney disease: Secondary | ICD-10-CM | POA: Insufficient documentation

## 2018-11-06 DIAGNOSIS — X58XXXA Exposure to other specified factors, initial encounter: Secondary | ICD-10-CM | POA: Insufficient documentation

## 2018-11-06 DIAGNOSIS — N1832 Chronic kidney disease, stage 3b: Secondary | ICD-10-CM | POA: Diagnosis present

## 2018-11-06 DIAGNOSIS — Z794 Long term (current) use of insulin: Secondary | ICD-10-CM

## 2018-11-06 DIAGNOSIS — R1031 Right lower quadrant pain: Secondary | ICD-10-CM | POA: Diagnosis present

## 2018-11-06 DIAGNOSIS — T148XXA Other injury of unspecified body region, initial encounter: Secondary | ICD-10-CM | POA: Diagnosis present

## 2018-11-06 DIAGNOSIS — R531 Weakness: Secondary | ICD-10-CM | POA: Diagnosis present

## 2018-11-06 DIAGNOSIS — Z86718 Personal history of other venous thrombosis and embolism: Secondary | ICD-10-CM | POA: Insufficient documentation

## 2018-11-06 DIAGNOSIS — Z6832 Body mass index (BMI) 32.0-32.9, adult: Secondary | ICD-10-CM | POA: Insufficient documentation

## 2018-11-06 DIAGNOSIS — Z7901 Long term (current) use of anticoagulants: Secondary | ICD-10-CM | POA: Diagnosis not present

## 2018-11-06 DIAGNOSIS — Z79899 Other long term (current) drug therapy: Secondary | ICD-10-CM | POA: Insufficient documentation

## 2018-11-06 DIAGNOSIS — Z8673 Personal history of transient ischemic attack (TIA), and cerebral infarction without residual deficits: Secondary | ICD-10-CM | POA: Insufficient documentation

## 2018-11-06 DIAGNOSIS — E119 Type 2 diabetes mellitus without complications: Secondary | ICD-10-CM

## 2018-11-06 DIAGNOSIS — M7981 Nontraumatic hematoma of soft tissue: Secondary | ICD-10-CM | POA: Diagnosis not present

## 2018-11-06 DIAGNOSIS — N6320 Unspecified lump in the left breast, unspecified quadrant: Secondary | ICD-10-CM | POA: Insufficient documentation

## 2018-11-06 DIAGNOSIS — D6832 Hemorrhagic disorder due to extrinsic circulating anticoagulants: Secondary | ICD-10-CM | POA: Diagnosis present

## 2018-11-06 DIAGNOSIS — E1122 Type 2 diabetes mellitus with diabetic chronic kidney disease: Secondary | ICD-10-CM | POA: Diagnosis not present

## 2018-11-06 DIAGNOSIS — I5033 Acute on chronic diastolic (congestive) heart failure: Secondary | ICD-10-CM | POA: Diagnosis present

## 2018-11-06 DIAGNOSIS — I82409 Acute embolism and thrombosis of unspecified deep veins of unspecified lower extremity: Secondary | ICD-10-CM | POA: Diagnosis present

## 2018-11-06 LAB — COMPREHENSIVE METABOLIC PANEL
ALT: 24 U/L (ref 0–44)
AST: 26 U/L (ref 15–41)
Albumin: 4 g/dL (ref 3.5–5.0)
Alkaline Phosphatase: 72 U/L (ref 38–126)
Anion gap: 9 (ref 5–15)
BUN: 30 mg/dL — ABNORMAL HIGH (ref 8–23)
CO2: 29 mmol/L (ref 22–32)
Calcium: 8.8 mg/dL — ABNORMAL LOW (ref 8.9–10.3)
Chloride: 106 mmol/L (ref 98–111)
Creatinine, Ser: 1.44 mg/dL — ABNORMAL HIGH (ref 0.44–1.00)
GFR calc Af Amer: 39 mL/min — ABNORMAL LOW (ref >60)
GFR calc non Af Amer: 33 mL/min — ABNORMAL LOW (ref >60)
Glucose, Bld: 134 mg/dL — ABNORMAL HIGH (ref 70–99)
POTASSIUM: 3.7 mmol/L (ref 3.5–5.1)
Sodium: 144 mmol/L (ref 135–145)
Total Bilirubin: 1 mg/dL (ref 0.3–1.2)
Total Protein: 7.6 g/dL (ref 6.5–8.1)

## 2018-11-06 LAB — URINALYSIS, ROUTINE W REFLEX MICROSCOPIC
Bacteria, UA: NONE SEEN
Bilirubin Urine: NEGATIVE
Glucose, UA: 150 mg/dL — AB
Hgb urine dipstick: NEGATIVE
Ketones, ur: NEGATIVE mg/dL
Nitrite: NEGATIVE
PH: 6 (ref 5.0–8.0)
Protein, ur: NEGATIVE mg/dL
SPECIFIC GRAVITY, URINE: 1.013 (ref 1.005–1.030)

## 2018-11-06 LAB — GLUCOSE, CAPILLARY: Glucose-Capillary: 252 mg/dL — ABNORMAL HIGH (ref 70–99)

## 2018-11-06 LAB — I-STAT TROPONIN, ED: TROPONIN I, POC: 0.05 ng/mL (ref 0.00–0.08)

## 2018-11-06 LAB — CBC WITH DIFFERENTIAL/PLATELET
Abs Immature Granulocytes: 0 10*3/uL (ref 0.00–0.07)
Basophils Absolute: 0 10*3/uL (ref 0.0–0.1)
Basophils Relative: 1 %
Eosinophils Absolute: 0.2 10*3/uL (ref 0.0–0.5)
Eosinophils Relative: 5 %
HCT: 35.4 % — ABNORMAL LOW (ref 36.0–46.0)
Hemoglobin: 10.7 g/dL — ABNORMAL LOW (ref 12.0–15.0)
Immature Granulocytes: 0 %
Lymphocytes Relative: 34 %
Lymphs Abs: 1.3 10*3/uL (ref 0.7–4.0)
MCH: 29.2 pg (ref 26.0–34.0)
MCHC: 30.2 g/dL (ref 30.0–36.0)
MCV: 96.5 fL (ref 80.0–100.0)
MONO ABS: 0.5 10*3/uL (ref 0.1–1.0)
Monocytes Relative: 12 %
Neutro Abs: 1.8 10*3/uL (ref 1.7–7.7)
Neutrophils Relative %: 48 %
Platelets: 94 10*3/uL — ABNORMAL LOW (ref 150–400)
RBC: 3.67 MIL/uL — ABNORMAL LOW (ref 3.87–5.11)
RDW: 14.6 % (ref 11.5–15.5)
WBC: 3.8 10*3/uL — AB (ref 4.0–10.5)
nRBC: 0 % (ref 0.0–0.2)

## 2018-11-06 LAB — CBG MONITORING, ED: Glucose-Capillary: 126 mg/dL — ABNORMAL HIGH (ref 70–99)

## 2018-11-06 LAB — PROTIME-INR
INR: 2.38
PROTHROMBIN TIME: 25.7 s — AB (ref 11.4–15.2)

## 2018-11-06 MED ORDER — FUROSEMIDE 40 MG PO TABS
80.0000 mg | ORAL_TABLET | Freq: Two times a day (BID) | ORAL | Status: DC
  Administered 2018-11-07 – 2018-11-09 (×6): 80 mg via ORAL
  Filled 2018-11-06 (×6): qty 2

## 2018-11-06 MED ORDER — INSULIN DETEMIR 100 UNIT/ML ~~LOC~~ SOLN
16.0000 [IU] | Freq: Every day | SUBCUTANEOUS | Status: DC
  Administered 2018-11-07 – 2018-11-09 (×3): 16 [IU] via SUBCUTANEOUS
  Filled 2018-11-06 (×3): qty 0.16

## 2018-11-06 MED ORDER — PREGABALIN 75 MG PO CAPS
75.0000 mg | ORAL_CAPSULE | Freq: Every day | ORAL | Status: DC
  Administered 2018-11-06 – 2018-11-08 (×3): 75 mg via ORAL
  Filled 2018-11-06 (×3): qty 1

## 2018-11-06 MED ORDER — ACETAMINOPHEN 325 MG PO TABS: 650.0000 mg | ORAL_TABLET | Freq: Four times a day (QID) | ORAL | Status: DC | PRN

## 2018-11-06 MED ORDER — LOSARTAN POTASSIUM 50 MG PO TABS
50.0000 mg | ORAL_TABLET | Freq: Every day | ORAL | Status: DC
  Administered 2018-11-07 – 2018-11-09 (×3): 50 mg via ORAL
  Filled 2018-11-06 (×3): qty 1

## 2018-11-06 MED ORDER — ONDANSETRON HCL 4 MG PO TABS: 4.0000 mg | ORAL_TABLET | Freq: Four times a day (QID) | ORAL | Status: DC | PRN

## 2018-11-06 MED ORDER — ONDANSETRON HCL 4 MG/2ML IJ SOLN
4.0000 mg | Freq: Four times a day (QID) | INTRAMUSCULAR | Status: DC | PRN
  Administered 2018-11-09: 4 mg via INTRAVENOUS
  Filled 2018-11-06: qty 2

## 2018-11-06 MED ORDER — PANTOPRAZOLE SODIUM 40 MG PO TBEC
40.0000 mg | DELAYED_RELEASE_TABLET | Freq: Every day | ORAL | Status: DC
  Administered 2018-11-07 – 2018-11-09 (×3): 40 mg via ORAL
  Filled 2018-11-06 (×4): qty 1

## 2018-11-06 MED ORDER — ATORVASTATIN CALCIUM 20 MG PO TABS
20.0000 mg | ORAL_TABLET | Freq: Every day | ORAL | Status: DC
  Administered 2018-11-06 – 2018-11-08 (×3): 20 mg via ORAL
  Filled 2018-11-06 (×3): qty 1

## 2018-11-06 MED ORDER — CARVEDILOL 12.5 MG PO TABS
12.5000 mg | ORAL_TABLET | Freq: Two times a day (BID) | ORAL | Status: DC
  Administered 2018-11-07 – 2018-11-08 (×4): 12.5 mg via ORAL
  Filled 2018-11-06 (×5): qty 1

## 2018-11-06 MED ORDER — FERROUS GLUCONATE 324 (38 FE) MG PO TABS
325.0000 mg | ORAL_TABLET | Freq: Every day | ORAL | Status: DC
  Administered 2018-11-07 – 2018-11-09 (×3): 325 mg via ORAL
  Filled 2018-11-06 (×3): qty 1

## 2018-11-06 MED ORDER — MAGNESIUM OXIDE 400 (241.3 MG) MG PO TABS
400.0000 mg | ORAL_TABLET | Freq: Every day | ORAL | Status: DC
  Administered 2018-11-07 – 2018-11-09 (×3): 400 mg via ORAL
  Filled 2018-11-06 (×3): qty 1

## 2018-11-06 MED ORDER — OXYBUTYNIN CHLORIDE 5 MG PO TABS
5.0000 mg | ORAL_TABLET | Freq: Two times a day (BID) | ORAL | Status: DC
  Administered 2018-11-06 – 2018-11-09 (×6): 5 mg via ORAL
  Filled 2018-11-06 (×6): qty 1

## 2018-11-06 MED ORDER — AMIODARONE HCL 200 MG PO TABS
200.0000 mg | ORAL_TABLET | Freq: Every day | ORAL | Status: DC
  Administered 2018-11-07 – 2018-11-09 (×3): 200 mg via ORAL
  Filled 2018-11-06 (×3): qty 1

## 2018-11-06 MED ORDER — INSULIN DETEMIR 100 UNIT/ML ~~LOC~~ SOLN
10.0000 [IU] | Freq: Every day | SUBCUTANEOUS | Status: DC
  Administered 2018-11-07 – 2018-11-08 (×3): 10 [IU] via SUBCUTANEOUS
  Filled 2018-11-06 (×4): qty 0.1

## 2018-11-06 MED ORDER — ACETAMINOPHEN 650 MG RE SUPP: 650.0000 mg | Freq: Four times a day (QID) | RECTAL | Status: DC | PRN

## 2018-11-06 MED ORDER — INSULIN ASPART 100 UNIT/ML ~~LOC~~ SOLN
0.0000 [IU] | Freq: Three times a day (TID) | SUBCUTANEOUS | Status: DC
  Administered 2018-11-07: 1 [IU] via SUBCUTANEOUS
  Administered 2018-11-08: 2 [IU] via SUBCUTANEOUS
  Administered 2018-11-08 – 2018-11-09 (×2): 5 [IU] via SUBCUTANEOUS

## 2018-11-06 MED ORDER — HYDROCODONE-ACETAMINOPHEN 5-325 MG PO TABS
2.0000 | ORAL_TABLET | Freq: Once | ORAL | Status: AC
  Administered 2018-11-06: 2 via ORAL
  Filled 2018-11-06: qty 2

## 2018-11-06 MED ORDER — POTASSIUM CHLORIDE CRYS ER 10 MEQ PO TBCR
20.0000 meq | EXTENDED_RELEASE_TABLET | Freq: Two times a day (BID) | ORAL | Status: DC
  Administered 2018-11-07 – 2018-11-09 (×5): 20 meq via ORAL
  Filled 2018-11-06 (×5): qty 2

## 2018-11-06 NOTE — ED Notes (Signed)
Hospitalist at bedside 

## 2018-11-06 NOTE — ED Notes (Signed)
Patient transported to CT 

## 2018-11-06 NOTE — ED Notes (Signed)
Pt taking home meds at this time: carvediolol, furosemide, coumadin, and potassium

## 2018-11-06 NOTE — ED Triage Notes (Signed)
Pt denies any falls or injuries to cause the right hip/groin pain that radiates to mid thigh.

## 2018-11-06 NOTE — ED Notes (Signed)
Bed: VA91 Expected date:  Expected time:  Means of arrival:  Comments: TR 9

## 2018-11-06 NOTE — ED Provider Notes (Signed)
[facility] DEPT Provider Note   CSN: [id number] Arrival date & time: 11/06/18  1052     History   Chief Complaint Chief Complaint  Patient presents with  . Hip Pain  . Leg Pain    HPI [name] is a 85 y.o. female.  85 y.o female with a PMH of CAD, CKD, CVA, Breast CA presents to the ED with a chief complaint of right hip pain x today.  His daughter report patient was transferring from the chair today onto the bed when she felt a twisting sensation to her right leg.  Reports after the incident patient was unable to move her leg.  She reports a similar episode prior week.  Reports she is having more weakness usual on the right leg recently and has a suspicion that patient might have had a couple of strokes within the past 2 weeks.  Does have an appointment with an oncologist today for a breast biopsy and staging as she currently has a history of breast cancer.  Patient also reports more swelling to her right leg along with decrease in range of motion.  Currently ambulates with a walker.  She denies any shortness of breath, chest pain at this time.     Past Medical History:  Diagnosis Date  . Anemia   . Arthritis   . Cataract   . Chronic diastolic CHF (congestive heart failure) ([place])   . CKD (chronic kidney disease), stage III ([place])   . Clotting disorder ([place])   . Diabetes mellitus ([place])   . Dizziness and giddiness   . DVT (deep venous thrombosis) ([place])    a. On Coumadin for this.  . Essential hypertension   . LBBB (left bundle branch block)   . Mitral regurgitation    a. Mod MR by echo 2014.  . Muscular deconditioning   . Obesity   . SVT (supraventricular tachycardia) ([place])    a. In 2013 she had an EPS with ablation for SVT which did not eliminate the SVT completely. She also had bradycardia which limited medication. She was placed on amiodarone by Dr. Lovena Le.  . Thrombocytopenia ([place]) 11/21/2011    Patient Active Problem List    Diagnosis Date Noted  . Hypertensive heart disease with heart failure ([place]) 09/05/2017  . Aphasia 08/31/2017  . Dysphagia 08/31/2017  . CKD (chronic kidney disease), stage III ([place])   . LBBB (left bundle branch block)   . Anemia   . HTN (hypertension)   . Obesity   . DVT (deep venous thrombosis) ([place])   . DM (diabetes mellitus) ([place]) 07/28/2015  . Fall 07/28/2015  . Warfarin-induced coagulopathy ([place]) 07/28/2015  . Chronic diastolic heart failure ([place]) 03/19/2014  . Edema 08/06/2013  . SVT (supraventricular tachycardia) ([place]) 10/15/2012  . First degree AV block 10/15/2012  . Acute renal insufficiency 10/14/2012  . Dizziness and giddiness   . Thrombocytopenia ([place]) 11/21/2011    Past Surgical History:  Procedure Laterality Date  . EYE SURGERY    . SUPRAVENTRICULAR TACHYCARDIA ABLATION N/A 10/11/2012   Procedure: SUPRAVENTRICULAR TACHYCARDIA ABLATION;  Surgeon: Evans Lance, MD;  Location: [facility] CATH LAB;  Service: Cardiovascular;  Laterality: N/A;     OB History   No obstetric history on file.      Home Medications    Prior to Admission medications   Medication Sig Start Date End Date Taking? Authorizing Provider  acetaminophen (TYLENOL) 500 MG tablet Take 1,000 mg by mouth every 6 (six) hours  as needed.   Yes [provider]  amiodarone (PACERONE) 200 MG tablet Take 1 tablet (200 mg total) by mouth daily. 10/05/18  Yes Jettie Booze, MD  atorvastatin (LIPITOR) 20 MG tablet TAKE 1 TABLET BY MOUTH AT NIGHT 10/04/18  Yes [name], MD  calcium carbonate (OS-CAL) 600 MG TABS Take 600 mg by mouth daily with breakfast.    Yes [provider]  carvedilol (COREG) 12.5 MG tablet Take 1 tablet (12.5 mg total) by mouth 2 (two) times daily. 09/14/18 12/13/18 Yes Simmons, Brittainy M, PA-C  COUMADIN 7.5 MG tablet TAKE 1 TABLET BY MOUTH ONCE DAILY ON TUESDAY, THURSDAY, AND SATURDAY, THEN 1/2 TABLET ON MONDAY, WEDNESDAY, [place] SUNDAY Patient taking  differently: Take 3.75-7.5 mg by mouth one time only at 6 PM. 3.75 mg on Mondays and Tuesdays, 7.5 mg on Wed, Thurs, Fri, Sat and Sun 10/19/18  Yes [name], MD  Cyanocobalamin (VITAMIN B 12 PO) Take 1 tablet daily by mouth.   Yes [provider]  ferrous gluconate (FERGON) 324 MG tablet Take 325 mg by mouth daily with breakfast.    Yes [provider]  furosemide (LASIX) 80 MG tablet Take 1 tablet (80 mg total) by mouth 2 (two) times daily. 07/09/18  Yes Bhagat, Bhavinkumar, PA  HUMALOG KWIKPEN 200 UNIT/ML SOPN Inject 4-8 Units into the skin 3 (three) times daily. Per sliding scale 03/22/16  Yes [provider]  LEVEMIR FLEXTOUCH 100 UNIT/ML Pen Inject 10-16 Units into the skin daily. 16 units in the am and 10 units in the evening 01/19/16  Yes [provider]  losartan (COZAAR) 100 MG tablet TAKE 1/2 TABLET(50 MG) BY MOUTH DAILY Patient taking differently: Take 50 mg by mouth daily.  02/06/18  Yes Jettie Booze, MD  magnesium oxide (MAG-OX) 400 MG tablet Take 400 mg by mouth daily.   Yes [provider]  omeprazole (PRILOSEC) 40 MG capsule TAKE ONE CAPSULE BY MOUTH DAILY BEFORE A MEAL Patient taking differently: Take 40 mg by mouth daily.  10/25/18  Yes [name], MD  oxybutynin (DITROPAN) 5 MG tablet Take 1 tablet (5 mg total) by mouth 2 (two) times daily. 10/16/18  Yes [name], MD  potassium chloride (MICRO-K) 10 MEQ CR capsule Take 4 tablets by mouth X's 1 day then go back to 2 tablets by mouth daily Patient taking differently: Take 20 mEq by mouth 2 (two) times daily.  01/20/16  Yes Lyda Jester M, PA-C  pregabalin (LYRICA) 75 MG capsule Take 75 mg by mouth at bedtime.   Yes [provider]  ACCU-CHEK AVIVA PLUS test strip USE AS DIRECTED TO CHECK BLOOD SUGAR QID 02/03/16   [provider]  BD PEN NEEDLE NANO U/F 32G X 4 MM MISC Use as directed 10/16/18   [name], MD  diclofenac sodium (VOLTAREN) 1 %  GEL Apply 2 g topically 4 (four) times daily. 10/30/18   Rodriguez-Southworth, Sunday Spillers, PA-C    Family History Family History  Problem Relation Age of Onset  . Stroke Mother   . Cancer Father   . Hypertension Daughter   . Heart attack Brother        x2  . Hypertension Brother   . Stroke Sister   . Hypertension Sister     Social History Social History   Tobacco Use  . Smoking status: Never Smoker  . Smokeless tobacco: Never Used  Substance Use Topics  . Alcohol use: No  . Drug use: No  Allergies   Aspirin and Penicillins   Review of Systems Review of Systems  Constitutional: Negative for chills and fever.  HENT: Negative for ear pain and sore throat.   Eyes: Negative for pain and visual disturbance.  Respiratory: Negative for cough and shortness of breath.   Cardiovascular: Negative for chest pain and palpitations.  Gastrointestinal: Negative for abdominal pain and vomiting.  Genitourinary: Negative for dysuria and hematuria.  Musculoskeletal: Positive for arthralgias and myalgias. Negative for back pain.  Skin: Negative for color change and rash.  Neurological: Negative for seizures and syncope.  All other systems reviewed and are negative.    Physical Exam Updated Vital Signs BP (!) 158/92   Pulse 63   Temp 98.2 F (36.8 C) (Oral)   Resp 20   Ht 5\' 5"  (1.651 m)   Wt 89.4 kg   SpO2 97%   BMI 32.78 kg/m   Physical Exam Vitals signs and nursing note reviewed.  Constitutional:      General: She is not in acute distress.    Appearance: She is well-developed.  HENT:     Head: Normocephalic and atraumatic.     Mouth/Throat:     Pharynx: No oropharyngeal exudate.  Eyes:     Pupils: Pupils are equal, round, and reactive to light.  Neck:     Musculoskeletal: Normal range of motion.  Cardiovascular:     Rate and Rhythm: Regular rhythm.     Heart sounds: Normal heart sounds.  Pulmonary:     Effort: Pulmonary effort is normal. No respiratory distress.      Breath sounds: Normal breath sounds.  Abdominal:     General: Bowel sounds are normal. There is no distension.     Palpations: Abdomen is soft.     Tenderness: There is no abdominal tenderness.  Musculoskeletal:        General: No tenderness or deformity.     Right lower leg: No edema.     Left lower leg: No edema.  Skin:    General: Skin is warm and dry.  Neurological:     Mental Status: She is alert and oriented to person, place, and time.     Comments: Extremities decreased from left upper or lower extremities.  Scissor present to the right extremity, 4/5 strength. Baseline per patient's daughter.       ED Treatments / Results  Labs (all labs ordered are listed, but only abnormal results are displayed) Labs Reviewed  CBC WITH DIFFERENTIAL/PLATELET - Abnormal; Notable for the following components:      Result Value   WBC 3.8 (*)    RBC 3.67 (*)    Hemoglobin 10.7 (*)    HCT 35.4 (*)    Platelets 94 (*)    All other components within normal limits  COMPREHENSIVE METABOLIC PANEL - Abnormal; Notable for the following components:   Glucose, Bld 134 (*)    BUN 30 (*)    Creatinine, Ser 1.44 (*)    Calcium 8.8 (*)    GFR calc non Af Amer 33 (*)    GFR calc Af Amer 39 (*)    All other components within normal limits  PROTIME-INR - Abnormal; Notable for the following components:   Prothrombin Time 25.7 (*)    All other components within normal limits  CBG MONITORING, ED - Abnormal; Notable for the following components:   Glucose-Capillary 126 (*)    All other components within normal limits  URINALYSIS, ROUTINE W REFLEX MICROSCOPIC  I-STAT  TROPONIN, ED    EKG None  Radiology Ct Pelvis Wo Contrast  Result Date: 11/06/2018 CLINICAL DATA:  Right hip and groin pain which began when the patient attempted to get out of her wheelchair this morning. Initial encounter. EXAM: CT OF THE LOWER BILATERAL EXTREMITY WITHOUT CONTRAST TECHNIQUE: Multidetector CT imaging of the  lower bilateral extremity was performed according to the standard protocol. COMPARISON:  None. FINDINGS: Bones/Joint/Cartilage The hips are located. No fracture is identified. Facet degenerative disease results in 0.6 cm anterolisthesis L4 on L5. There is disc bulging at both L4-5 and L5-S1. Foraminal narrowing is seen at both levels which appears worst bilaterally at L5-S1. Mild bilateral hip osteoarthritis is noted. Sacroiliac joints and symphysis pubis appear normal. Ligaments Suboptimally assessed by CT. Muscles and Tendons There is a small volume of hemorrhage and associated swelling in the right iliopsoas near the musculotendinous junction consistent with strain. No frank tear is identified on CT scan. Soft tissues Imaged intrapelvic contents demonstrate moderate distention of the urinary bladder. The patient is status post hysterectomy. IMPRESSION: Findings consistent with strain of the right iliopsoas with some associated hemorrhage and swelling at the musculotendinous junction. Negative for fracture. Lower lumbar spondylosis. Electronically Signed   By: Inge Rise M.D.   On: 11/06/2018 18:17   Mr Brain Wo Contrast  Result Date: 11/06/2018 CLINICAL DATA:  Altered level of consciousness.  Possible stroke. EXAM: MRI HEAD WITHOUT CONTRAST TECHNIQUE: Multiplanar, multiecho pulse sequences of the brain and surrounding structures were obtained without intravenous contrast. COMPARISON:  11/17/2017 FINDINGS: Brain: Diffusion imaging does not show any acute or subacute infarction. Brainstem and cerebellum are normal. Cerebral hemispheres are normal except for very minimal small vessel change of the hemispheric deep white matter, less than often seen at this age. No cortical or large vessel territory infarction. No mass lesion, hemorrhage, hydrocephalus or extra-axial collection. Vascular: Major vessels at the base of the brain show flow. Skull and upper cervical spine: Negative Sinuses/Orbits: Clear/normal.   Previous lens implant on the right. Other: None IMPRESSION: Normal examination for age. No acute or reversible finding. Very minimal small vessel change of the hemispheric deep white matter, less than often seen at this age. Electronically Signed   By: Nelson Chimes M.D.   On: 11/06/2018 15:02   Dg Hip Unilat  With Pelvis 2-3 Views Right  Result Date: 11/06/2018 CLINICAL DATA:  Right hip and groin pain after attempting to get out of wheelchair this morning. EXAM: DG HIP (WITH OR WITHOUT PELVIS) 2-3V RIGHT COMPARISON:  None. FINDINGS: The bones are demineralized. No evidence of acute fracture, dislocation or femoral head avascular necrosis. The hip joint spaces are relatively preserved. There are degenerative changes within the lower lumbar spine and sacroiliac joints. Bilateral pelvic calcifications are likely phleboliths. IMPRESSION: No acute osseous findings or significant hip arthropathy. If the patient has persistent hip pain or inability to bear weight, follow up imaging may be warranted as acute hip fractures can be radiographically occult in the elderly. Lumbar spondylosis. Electronically Signed   By: Richardean Sale M.D.   On: 11/06/2018 13:13    Procedures Procedures (including critical care time)  Medications Ordered in ED Medications - No data to display   Initial Impression / Assessment and Plan / ED Course  I have reviewed the triage vital signs and the nursing notes.  Pertinent labs & imaging results that were available during my care of the patient were reviewed by me and considered in my medical decision making (see  chart for details).   She presents with worsening right hip weakness, states that she felt a twisting sensation along her leg today.  This episode has happened previously in the past week.  Daughter is concerned for patient having stroke as she has a previous history of strokes.  Evaluation patient significantly weaker on the right side cannot rule out CVA will obtain  an MRI brain. Does have a history of breast cancer and was supposed to attend an appointment today to obtain staging.  CBC showed white blood cell count still with patient's previous visit.  The MP showed no electrolyte abnormality, no hyponatremia or hypokalemia.  Creatinine level is consistent with patient's previous visit.  Troponin was 0.05.  xray of the right hip showed: No acute osseous findings or significant hip arthropathy. If the  patient has persistent hip pain or inability to bear weight, follow  up imaging may be warranted as acute hip fractures can be  radiographically occult in the elderly. Lumbar spondylosis.     MRI Brain showed:  Normal examination for age. No acute or reversible finding. Very  minimal small vessel change of the hemispheric deep white matter,  less than often seen at this age.   Discussed this patient with Dr. Kathrynn Humble who has also evaluated patient.  She will also get a CT pelvis without contrast to rule out any acute process.  CT pelvis showed: Findings consistent with strain of the right iliopsoas with some  associated hemorrhage and swelling at the musculotendinous junction.    Negative for fracture.    Lower lumbar spondylosis.   INR was rechecked for patient, 2.3 on today's visits.  Order urine for patient at this time.  I have placed a call for admission as patient is unable to ambulate in the ED at this time.  Dr. Hal Hope will evaluate and admit patient for the hospitalist service.   Final Clinical Impressions(s) / ED Diagnoses   Final diagnoses:  Right hip pain  Hematoma of right iliopsoas muscle, initial encounter    ED Discharge Orders    None       Janeece Fitting, Hershal Coria 11/06/18 2031    Varney Biles, MD 11/07/18 [phone]

## 2018-11-06 NOTE — ED Notes (Signed)
Transport called to take patient upstairs 

## 2018-11-06 NOTE — ED Notes (Signed)
Bed: WTR9 Expected date:  Expected time:  Means of arrival:  Comments: 

## 2018-11-06 NOTE — ED Notes (Signed)
Per admission nurse, patient reports taking all of her "evening medicines" from home.

## 2018-11-06 NOTE — H&P (Signed)
History and Physical    [name]:[id number] DOB: 02-Oct-1933 DOA: 11/06/2018  PCP: [name], MD  Patient coming from: Home.  Chief Complaint: Right groin pain.    HPI: [name] is a 85 y.o. female with history of atrial fibrillation on Coumadin, chronic CHF, chronic kidney disease stage III, chronic anemia, diabetes mellitus type 2, history of DVT, hypertension started experiencing severe right groin pain after patient was planning to go to bed after watching TV at around 3 AM this morning.  Pain has been persistently worsening with some weakness of the right lower extremity.  Patient found a nodule-like sensation in the right groin.  Patient states when she was trying to get onto the bed she stretch her right leg when she felt like a pop following which the pain developed.  ED Course: In the ER patient's labs are at the baseline.  MRI brain was done because there was some concern for right lower extremity weakness and MRI brain was negative for anything acute.  CT of the pelvis shows strain of the right iliopsoas with some hemorrhage in it.  Given the pain and difficulty moving patient has been admitted for further observation.  Patient has already taken her dose of Coumadin today and INR is already therapeutic.  Review of Systems: As per HPI, rest all negative.   Past Medical History:  Diagnosis Date  . Anemia   . Arthritis   . Cataract   . Chronic diastolic CHF (congestive heart failure) ([place])   . CKD (chronic kidney disease), stage III ([place])   . Clotting disorder ([place])   . Diabetes mellitus ([place])   . Dizziness and giddiness   . DVT (deep venous thrombosis) ([place])    a. On Coumadin for this.  . Essential hypertension   . LBBB (left bundle branch block)   . Mitral regurgitation    a. Mod MR by echo 2014.  . Muscular deconditioning   . Obesity   . SVT (supraventricular tachycardia) ([place])    a. In 2013 she had an EPS with ablation for SVT which did not eliminate  the SVT completely. She also had bradycardia which limited medication. She was placed on amiodarone by Dr. Lovena Le.  . Thrombocytopenia ([place]) 11/21/2011    Past Surgical History:  Procedure Laterality Date  . EYE SURGERY    . SUPRAVENTRICULAR TACHYCARDIA ABLATION N/A 10/11/2012   Procedure: SUPRAVENTRICULAR TACHYCARDIA ABLATION;  Surgeon: Evans Lance, MD;  Location[facility] [place];  Service: Cardiovascular;  Laterality: N/A;     reports that she has never smoked. She has never used smokeless tobacco. She reports that she does not drink alcohol or use drugs.  Allergies  Allergen Reactions  . Aspirin Itching  . Penicillins Itching and Rash    DID THE REACTION INVOLVE: Swelling of the face/tongue/throat, SOB, or low BP? No Sudden or severe rash/hives, skin peeling, or the inside of the mouth or nose? Yes Did it require medical treatment? yes When did it last happen?more than 10 years If all above answers are "NO", may proceed with cephalosporin use.     Family History  Problem Relation Age of Onset  . Stroke Mother   . Cancer Father   . Hypertension Daughter   . Heart attack Brother        x2  . Hypertension Brother   . Stroke Sister   . Hypertension Sister     Prior to Admission medications   Medication Sig Start Date  End Date Taking? Authorizing Provider  acetaminophen (TYLENOL) 500 MG tablet Take 1,000 mg by mouth every 6 (six) hours as needed.   Yes [provider]  amiodarone (PACERONE) 200 MG tablet Take 1 tablet (200 mg total) by mouth daily. 10/05/18  Yes Jettie Booze, MD  atorvastatin (LIPITOR) 20 MG tablet TAKE 1 TABLET BY MOUTH AT NIGHT 10/04/18  Yes [name], MD  calcium carbonate (OS-CAL) 600 MG TABS Take 600 mg by mouth daily with breakfast.    Yes [provider]  carvedilol (COREG) 12.5 MG tablet Take 1 tablet (12.5 mg total) by mouth 2 (two) times daily. 09/14/18 12/13/18 Yes Simmons, Brittainy M, PA-C  COUMADIN 7.5 MG  tablet TAKE 1 TABLET BY MOUTH ONCE DAILY ON TUESDAY, THURSDAY, AND SATURDAY, THEN 1/2 TABLET ON MONDAY, WEDNESDAY, [place] SUNDAY Patient taking differently: Take 3.75-7.5 mg by mouth one time only at 6 PM. 3.75 mg on Mondays and Tuesdays, 7.5 mg on Wed, Thurs, Fri, Sat and Sun 10/19/18  Yes [name], MD  Cyanocobalamin (VITAMIN B 12 PO) Take 1 tablet daily by mouth.   Yes [provider]  ferrous gluconate (FERGON) 324 MG tablet Take 325 mg by mouth daily with breakfast.    Yes [provider]  furosemide (LASIX) 80 MG tablet Take 1 tablet (80 mg total) by mouth 2 (two) times daily. 07/09/18  Yes Bhagat, Bhavinkumar, PA  HUMALOG KWIKPEN 200 UNIT/ML SOPN Inject 4-8 Units into the skin 3 (three) times daily. Per sliding scale 03/22/16  Yes [provider]  LEVEMIR FLEXTOUCH 100 UNIT/ML Pen Inject 10-16 Units into the skin daily. 16 units in the am and 10 units in the evening 01/19/16  Yes [provider]  losartan (COZAAR) 100 MG tablet TAKE 1/2 TABLET(50 MG) BY MOUTH DAILY Patient taking differently: Take 50 mg by mouth daily.  02/06/18  Yes Jettie Booze, MD  magnesium oxide (MAG-OX) 400 MG tablet Take 400 mg by mouth daily.   Yes [provider]  omeprazole (PRILOSEC) 40 MG capsule TAKE ONE CAPSULE BY MOUTH DAILY BEFORE A MEAL Patient taking differently: Take 40 mg by mouth daily.  10/25/18  Yes [name], MD  oxybutynin (DITROPAN) 5 MG tablet Take 1 tablet (5 mg total) by mouth 2 (two) times daily. 10/16/18  Yes [name], MD  potassium chloride (MICRO-K) 10 MEQ CR capsule Take 4 tablets by mouth X's 1 day then go back to 2 tablets by mouth daily Patient taking differently: Take 20 mEq by mouth 2 (two) times daily.  01/20/16  Yes Lyda Jester M, PA-C  pregabalin (LYRICA) 75 MG capsule Take 75 mg by mouth at bedtime.   Yes [provider]  ACCU-CHEK AVIVA PLUS test strip USE AS DIRECTED TO CHECK BLOOD SUGAR QID 02/03/16    [provider]  BD PEN NEEDLE NANO U/F 32G X 4 MM MISC Use as directed 10/16/18   [name], MD  diclofenac sodium (VOLTAREN) 1 % GEL Apply 2 g topically 4 (four) times daily. 10/30/18   Rodriguez-SouthworthSunday Spillers, PA-C    Physical Exam: Vitals:   11/06/18 1800 11/06/18 1830 11/06/18 1900 11/06/18 2226  BP: (!) 130/99 (!) 152/66 (!) 158/92 (!) 161/70  Pulse: 64 63 63 (!) 57  Resp: 16 (!) 22 20 20   Temp:    99 F (37.2 C)  TempSrc:    Oral  SpO2: 96% 97% 97% 99%  Weight:    82.6 kg  Height:  5\' 5"  (1.651 m)      Constitutional: Moderately built and nourished. Vitals:   11/06/18 1800 11/06/18 1830 11/06/18 1900 11/06/18 2226  BP: (!) 130/99 (!) 152/66 (!) 158/92 (!) 161/70  Pulse: 64 63 63 (!) 57  Resp: 16 (!) 22 20 20   Temp:    99 F (37.2 C)  TempSrc:    Oral  SpO2: 96% 97% 97% 99%  Weight:    82.6 kg  Height:    5\' 5"  (1.651 m)   Eyes: Anicteric no pallor. ENMT: No discharge from the ears eyes nose or mouth. Neck: No mass felt.  No neck rigidity. Respiratory: No rhonchi or crepitations. Cardiovascular: S1-S2 heard. Abdomen: Soft nontender bowel sounds present. Musculoskeletal: Pain on moving right hip. Skin: No rash. Neurologic: Alert awake oriented to time place and person.  Moves all extremities. Psychiatric: Peers normal.  Normal affect.   Labs on Admission: I have personally reviewed following labs and imaging studies  CBC: Recent Labs  Lab 11/06/18 1356  WBC 3.8*  NEUTROABS 1.8  HGB 10.7*  HCT 35.4*  MCV 96.5  PLT 94*   Basic Metabolic Panel: Recent Labs  Lab 11/06/18 1356  NA 144  K 3.7  CL 106  CO2 29  GLUCOSE 134*  BUN 30*  CREATININE 1.44*  CALCIUM 8.8*   GFR: Estimated Creatinine Clearance: 31.4 mL/min (A) (by C-G formula based on SCr of 1.44 mg/dL (H)). Liver Function Tests: Recent Labs  Lab 11/06/18 1356  AST 26  ALT 24  ALKPHOS 72  BILITOT 1.0  PROT 7.6  ALBUMIN 4.0   No results for input(s): LIPASE,  AMYLASE in the last 168 hours. No results for input(s): AMMONIA in the last 168 hours. Coagulation Profile: Recent Labs  Lab 11/02/18 11/06/18 1356  INR 2.2* 2.38   Cardiac Enzymes: No results for input(s): CKTOTAL, CKMB, CKMBINDEX, TROPONINI in the last 168 hours. BNP (last 3 results) Recent Labs    04/04/18 1025 07/06/18 1118 08/06/18 1244  PROBNP 1,189* 1,500* 1,604*   HbA1C: No results for input(s): HGBA1C in the last 72 hours. CBG: Recent Labs  Lab 11/06/18 1208  GLUCAP 126*   Lipid Profile: No results for input(s): CHOL, HDL, LDLCALC, TRIG, CHOLHDL, LDLDIRECT in the last 72 hours. Thyroid Function Tests: No results for input(s): TSH, T4TOTAL, FREET4, T3FREE, THYROIDAB in the last 72 hours. Anemia Panel: No results for input(s): VITAMINB12, FOLATE, FERRITIN, TIBC, IRON, RETICCTPCT in the last 72 hours. Urine analysis:    Component Value Date/Time   COLORURINE YELLOW 11/06/2018 2027   APPEARANCEUR CLEAR 11/06/2018 2027   LABSPEC 1.013 11/06/2018 2027   PHURINE 6.0 11/06/2018 2027   GLUCOSEU 150 (A) 11/06/2018 2027   HGBUR NEGATIVE 11/06/2018 2027   BILIRUBINUR NEGATIVE 11/06/2018 2027   KETONESUR NEGATIVE 11/06/2018 2027   PROTEINUR NEGATIVE 11/06/2018 2027   UROBILINOGEN 0.2 10/12/2012 2037   NITRITE NEGATIVE 11/06/2018 2027   LEUKOCYTESUR MODERATE (A) 11/06/2018 2027   Sepsis Labs: @LABRCNTIP (procalcitonin:4,lacticidven:4) )No results found for this or any previous visit (from the past 240 hour(s)).   Radiological Exams on Admission: Ct Pelvis Wo Contrast  Result Date: 11/06/2018 CLINICAL DATA:  Right hip and groin pain which began when the patient attempted to get out of her wheelchair this morning. Initial encounter. EXAM: CT OF THE LOWER BILATERAL EXTREMITY WITHOUT CONTRAST TECHNIQUE: Multidetector CT imaging of the lower bilateral extremity was performed according to the standard protocol. COMPARISON:  None. FINDINGS: Bones/Joint/Cartilage The hips are  located. No fracture is  identified. Facet degenerative disease results in 0.6 cm anterolisthesis L4 on L5. There is disc bulging at both L4-5 and L5-S1. Foraminal narrowing is seen at both levels which appears worst bilaterally at L5-S1. Mild bilateral hip osteoarthritis is noted. Sacroiliac joints and symphysis pubis appear normal. Ligaments Suboptimally assessed by CT. Muscles and Tendons There is a small volume of hemorrhage and associated swelling in the right iliopsoas near the musculotendinous junction consistent with strain. No frank tear is identified on CT scan. Soft tissues Imaged intrapelvic contents demonstrate moderate distention of the urinary bladder. The patient is status post hysterectomy. IMPRESSION: Findings consistent with strain of the right iliopsoas with some associated hemorrhage and swelling at the musculotendinous junction. Negative for fracture. Lower lumbar spondylosis. Electronically Signed   By: Inge Rise M.D.   On: 11/06/2018 18:17   Mr Brain Wo Contrast  Result Date: 11/06/2018 CLINICAL DATA:  Altered level of consciousness.  Possible stroke. EXAM: MRI HEAD WITHOUT CONTRAST TECHNIQUE: Multiplanar, multiecho pulse sequences of the brain and surrounding structures were obtained without intravenous contrast. COMPARISON:  11/17/2017 FINDINGS: Brain: Diffusion imaging does not show any acute or subacute infarction. Brainstem and cerebellum are normal. Cerebral hemispheres are normal except for very minimal small vessel change of the hemispheric deep white matter, less than often seen at this age. No cortical or large vessel territory infarction. No mass lesion, hemorrhage, hydrocephalus or extra-axial collection. Vascular: Major vessels at the base of the brain show flow. Skull and upper cervical spine: Negative Sinuses/Orbits: Clear/normal.  Previous lens implant on the right. Other: None IMPRESSION: Normal examination for age. No acute or reversible finding. Very minimal small  vessel change of the hemispheric deep white matter, less than often seen at this age. Electronically Signed   By: Nelson Chimes M.D.   On: 11/06/2018 15:02   Dg Hip Unilat  With Pelvis 2-3 Views Right  Result Date: 11/06/2018 CLINICAL DATA:  Right hip and groin pain after attempting to get out of wheelchair this morning. EXAM: DG HIP (WITH OR WITHOUT PELVIS) 2-3V RIGHT COMPARISON:  None. FINDINGS: The bones are demineralized. No evidence of acute fracture, dislocation or femoral head avascular necrosis. The hip joint spaces are relatively preserved. There are degenerative changes within the lower lumbar spine and sacroiliac joints. Bilateral pelvic calcifications are likely phleboliths. IMPRESSION: No acute osseous findings or significant hip arthropathy. If the patient has persistent hip pain or inability to bear weight, follow up imaging may be warranted as acute hip fractures can be radiographically occult in the elderly. Lumbar spondylosis. Electronically Signed   By: Richardean Sale M.D.   On: 11/06/2018 13:13    EKG: Independently reviewed.  Possible complete heart block with LBBB.  Rate around 60.  Assessment/Plan Principal Problem:   Hematoma of right iliopsoas muscle Active Problems:   Chronic diastolic heart failure ([facility])   CKD (chronic kidney disease), stage III ([facility])   HTN (hypertension)   DVT (deep venous thrombosis) ([facility])   DM (diabetes mellitus) ([place])   Warfarin-induced coagulopathy ([facility])   Intramuscular hematoma    1. Right lower extremity pain with iliopsoas strain with small hemorrhage -given the significant pain and difficulty moving and patient being on anticoagulation with some hemorrhage seen will admit patient for observation hold Coumadin for now but patient is already taken her dose tonight.  INR is therapeutic but I am not reversing for now.  If pain worsens may have to reverse Coumadin and hold for some days. 2. Abnormal EKG showing possibility  of complete heart  block.  We will get a repeat EKG and monitor in telemetry. 3. History of A. fib on Coreg amiodarone and Coumadin.  Coumadin on hold secondary to #1. 4. Chronic kidney disease stage III creatinine appears to be at baseline.  Patient is on Lasix.  Note that patient is also on ARB.  If creatinine worsens may have to hold it. 5. Chronic diastolic CHF last EF measured in December 2018 was 50 to 55% on Lasix 80 mg daily. 6. Hypertension on ARB Coreg.  I am repeating an EKG to make sure there is no complete heart block.  If it is on alcohol Coreg. 7. Diabetes mellitus type 2 on Levemir. 8. Anemia likely from chronic kidney disease follow CBC.  Patient's family is also concerned about patient having Lyme disease since whole family has had.  Lyme titers have been ordered for the morning blood draw.   DVT prophylaxis: SCDs for now. Code Status: Full code. Family Communication: Patient's daughter. Disposition Plan: Home. Consults called: Physical therapy. Admission status: Observation.   Rise Patience MD [facility] Pager [phone].  If 7PM-7AM, please contact night-coverage [email or web address] Password [email or web address]  11/06/2018, 11:01 PM

## 2018-11-06 NOTE — ED Triage Notes (Signed)
Pt presents via EMS from home with c/o right hip/groin pain after attempting to get out of her wheelchair at home this morning. Pt has bed bugs per family.

## 2018-11-06 NOTE — ED Notes (Signed)
Patient transported to MRI 

## 2018-11-06 NOTE — ED Notes (Signed)
ED TO INPATIENT HANDOFF REPORT  Name/Age/Gender [name] 85 y.o. female  Code Status   Home/SNF/Other Home  Chief Complaint Groin/Hip Pain  Level of Care/Admitting Diagnosis ED Disposition    ED Disposition Condition [facility]: [place] [[id number]]  Level of Care: Med-Surg [16]  Diagnosis: Intramuscular hematoma [720713]  Admitting Physician: Rise Patience [phone]  Attending Physician: Rise Patience [phone]  PT Class (Do Not Modify): Observation [104]  PT Acc Code (Do Not Modify): Observation [10022]       Medical History Past Medical History:  Diagnosis Date  . Anemia   . Arthritis   . Cataract   . Chronic diastolic CHF (congestive heart failure) ([place])   . CKD (chronic kidney disease), stage III ([place])   . Clotting disorder ([place])   . Diabetes mellitus ([place])   . Dizziness and giddiness   . DVT (deep venous thrombosis) ([place])    a. On Coumadin for this.  . Essential hypertension   . LBBB (left bundle branch block)   . Mitral regurgitation    a. Mod MR by echo 2014.  . Muscular deconditioning   . Obesity   . SVT (supraventricular tachycardia) ([place])    a. In 2013 she had an EPS with ablation for SVT which did not eliminate the SVT completely. She also had bradycardia which limited medication. She was placed on amiodarone by Dr. Lovena Le.  . Thrombocytopenia ([place]) 11/21/2011    Allergies Allergies  Allergen Reactions  . Aspirin Itching  . Penicillins Itching and Rash    DID THE REACTION INVOLVE: Swelling of the face/tongue/throat, SOB, or low BP? No Sudden or severe rash/hives, skin peeling, or the inside of the mouth or nose? Yes Did it require medical treatment? yes When did it last happen?more than 10 years If all above answers are "NO", may proceed with cephalosporin use.     IV Location/Drains/Wounds Patient Lines/Drains/Airways Status   Active Line/Drains/Airways    Name:   Placement date:    Placement time:   Site:   Days:   Peripheral IV 11/06/18 Left Antecubital   11/06/18    1354    Antecubital   less than 1          Labs/Imaging Results for orders placed or performed during the hospital encounter of 11/06/18 (from the past 48 hour(s))  CBG monitoring, ED     Status: Abnormal   Collection Time: 11/06/18 12:08 PM  Result Value Ref Range   Glucose-Capillary 126 (H) 70 - 99 mg/dL  CBC with Differential     Status: Abnormal   Collection Time: 11/06/18  1:56 PM  Result Value Ref Range   WBC 3.8 (L) 4.0 - 10.5 K/uL   RBC 3.67 (L) 3.87 - 5.11 MIL/uL   Hemoglobin 10.7 (L) 12.0 - 15.0 g/dL   HCT 35.4 (L) 36.0 - 46.0 %   MCV 96.5 80.0 - 100.0 fL   MCH 29.2 26.0 - 34.0 pg   MCHC 30.2 30.0 - 36.0 g/dL   RDW 14.6 11.5 - 15.5 %   Platelets 94 (L) 150 - 400 K/uL    Comment: REPEATED TO VERIFY PLATELET COUNT CONFIRMED BY SMEAR SPECIMEN CHECKED FOR CLOTS Immature Platelet Fraction may be clinically indicated, consider ordering this additional test [id number]    nRBC 0.0 0.0 - 0.2 %   Neutrophils Relative % 48 %   Neutro Abs 1.8 1.7 - 7.7 K/uL   Lymphocytes Relative 34 %  Lymphs Abs 1.3 0.7 - 4.0 K/uL   Monocytes Relative 12 %   Monocytes Absolute 0.5 0.1 - 1.0 K/uL   Eosinophils Relative 5 %   Eosinophils Absolute 0.2 0.0 - 0.5 K/uL   Basophils Relative 1 %   Basophils Absolute 0.0 0.0 - 0.1 K/uL   WBC Morphology WHITE COUNT CONFIRMED ON SMEAR    Immature Granulocytes 0 %   Abs Immature Granulocytes 0.00 0.00 - 0.07 K/uL    Comment: Performed at [facility], [place] [address] [place], [place] [address]  Comprehensive metabolic panel     Status: Abnormal   Collection Time: 11/06/18  1:56 PM  Result Value Ref Range   Sodium 144 135 - 145 mmol/L   Potassium 3.7 3.5 - 5.1 mmol/L   Chloride 106 98 - 111 mmol/L   CO2 29 22 - 32 mmol/L   Glucose, Bld 134 (H) 70 - 99 mg/dL   BUN 30 (H) 8 - 23 mg/dL   Creatinine, Ser 1.44 (H) 0.44 - 1.00 mg/dL    Calcium 8.8 (L) 8.9 - 10.3 mg/dL   Total Protein 7.6 6.5 - 8.1 g/dL   Albumin 4.0 3.5 - 5.0 g/dL   AST 26 15 - 41 U/L   ALT 24 0 - 44 U/L   Alkaline Phosphatase 72 38 - 126 U/L   Total Bilirubin 1.0 0.3 - 1.2 mg/dL   GFR calc non Af Amer 33 (L) >60 mL/min   GFR calc Af Amer 39 (L) >60 mL/min   Anion gap 9 5 - 15    Comment: Performed at [facility], [place] [address] [place], [place] [address]  Protime-INR     Status: Abnormal   Collection Time: 11/06/18  1:56 PM  Result Value Ref Range   Prothrombin Time 25.7 (H) 11.4 - 15.2 seconds   INR 2.38     Comment: Performed at [facility], [place] [address], [place], [place] [address]  I-Stat Troponin, ED (not at [facility])     Status: None   Collection Time: 11/06/18  1:59 PM  Result Value Ref Range   Troponin i, poc 0.05 0.00 - 0.08 ng/mL   Comment 3            Comment: Due to the release kinetics of cTnI, a negative result within the first hours of the onset of symptoms does not rule out myocardial infarction with certainty. If myocardial infarction is still suspected, repeat the test at appropriate intervals.   Urinalysis, Routine w reflex microscopic     Status: Abnormal   Collection Time: 11/06/18  8:27 PM  Result Value Ref Range   Color, Urine YELLOW YELLOW   APPearance CLEAR CLEAR   Specific Gravity, Urine 1.013 1.005 - 1.030   pH 6.0 5.0 - 8.0   Glucose, UA 150 (A) NEGATIVE mg/dL   Hgb urine dipstick NEGATIVE NEGATIVE   Bilirubin Urine NEGATIVE NEGATIVE   Ketones, ur NEGATIVE NEGATIVE mg/dL   Protein, ur NEGATIVE NEGATIVE mg/dL   Nitrite NEGATIVE NEGATIVE   Leukocytes, UA MODERATE (A) NEGATIVE   RBC / HPF 0-5 0 - 5 RBC/hpf   WBC, UA 6-10 0 - 5 WBC/hpf   Bacteria, UA NONE SEEN NONE SEEN   Squamous Epithelial / LPF 0-5 0 - 5    Comment: Performed at [facility], [place] [address], [place], [place] [address]   Ct Pelvis Wo Contrast  Result Date: 11/06/2018 CLINICAL DATA:   Right hip  and groin pain which began when the patient attempted to get out of her wheelchair this morning. Initial encounter. EXAM: CT OF THE LOWER BILATERAL EXTREMITY WITHOUT CONTRAST TECHNIQUE: Multidetector CT imaging of the lower bilateral extremity was performed according to the standard protocol. COMPARISON:  None. FINDINGS: Bones/Joint/Cartilage The hips are located. No fracture is identified. Facet degenerative disease results in 0.6 cm anterolisthesis L4 on L5. There is disc bulging at both L4-5 and L5-S1. Foraminal narrowing is seen at both levels which appears worst bilaterally at L5-S1. Mild bilateral hip osteoarthritis is noted. Sacroiliac joints and symphysis pubis appear normal. Ligaments Suboptimally assessed by CT. Muscles and Tendons There is a small volume of hemorrhage and associated swelling in the right iliopsoas near the musculotendinous junction consistent with strain. No frank tear is identified on CT scan. Soft tissues Imaged intrapelvic contents demonstrate moderate distention of the urinary bladder. The patient is status post hysterectomy. IMPRESSION: Findings consistent with strain of the right iliopsoas with some associated hemorrhage and swelling at the musculotendinous junction. Negative for fracture. Lower lumbar spondylosis. Electronically Signed   By: Inge Rise M.D.   On: 11/06/2018 18:17   Mr Brain Wo Contrast  Result Date: 11/06/2018 CLINICAL DATA:  Altered level of consciousness.  Possible stroke. EXAM: MRI HEAD WITHOUT CONTRAST TECHNIQUE: Multiplanar, multiecho pulse sequences of the brain and surrounding structures were obtained without intravenous contrast. COMPARISON:  11/17/2017 FINDINGS: Brain: Diffusion imaging does not show any acute or subacute infarction. Brainstem and cerebellum are normal. Cerebral hemispheres are normal except for very minimal small vessel change of the hemispheric deep white matter, less than often seen at this age. No cortical or large  vessel territory infarction. No mass lesion, hemorrhage, hydrocephalus or extra-axial collection. Vascular: Major vessels at the base of the brain show flow. Skull and upper cervical spine: Negative Sinuses/Orbits: Clear/normal.  Previous lens implant on the right. Other: None IMPRESSION: Normal examination for age. No acute or reversible finding. Very minimal small vessel change of the hemispheric deep white matter, less than often seen at this age. Electronically Signed   By: Nelson Chimes M.D.   On: 11/06/2018 15:02   Dg Hip Unilat  With Pelvis 2-3 Views Right  Result Date: 11/06/2018 CLINICAL DATA:  Right hip and groin pain after attempting to get out of wheelchair this morning. EXAM: DG HIP (WITH OR WITHOUT PELVIS) 2-3V RIGHT COMPARISON:  None. FINDINGS: The bones are demineralized. No evidence of acute fracture, dislocation or femoral head avascular necrosis. The hip joint spaces are relatively preserved. There are degenerative changes within the lower lumbar spine and sacroiliac joints. Bilateral pelvic calcifications are likely phleboliths. IMPRESSION: No acute osseous findings or significant hip arthropathy. If the patient has persistent hip pain or inability to bear weight, follow up imaging may be warranted as acute hip fractures can be radiographically occult in the elderly. Lumbar spondylosis. Electronically Signed   By: Richardean Sale M.D.   On: 11/06/2018 13:13   None  Pending Labs Unresulted Labs (From admission, onward)   None      Vitals/Pain Today's Vitals   11/06/18 1730 11/06/18 1800 11/06/18 1830 11/06/18 1900  BP: 136/81 (!) 130/99 (!) 152/66 (!) 158/92  Pulse: 66 64 63 63  Resp: 18 16 (!) 22 20  Temp:      TempSrc:      SpO2: 99% 96% 97% 97%  Weight:      Height:      PainSc:  Isolation Precautions No active isolations  Medications Medications - No data to display  Mobility walks with person assist

## 2018-11-06 NOTE — ED Notes (Signed)
Attempted to ambulate pt but unsuccessful due to pain and weakness on right side.

## 2018-11-07 ENCOUNTER — Other Ambulatory Visit: Payer: Self-pay

## 2018-11-07 ENCOUNTER — Other Ambulatory Visit: Payer: Self-pay | Admitting: *Deleted

## 2018-11-07 DIAGNOSIS — S76011A Strain of muscle, fascia and tendon of right hip, initial encounter: Secondary | ICD-10-CM | POA: Diagnosis not present

## 2018-11-07 DIAGNOSIS — I480 Paroxysmal atrial fibrillation: Secondary | ICD-10-CM | POA: Diagnosis not present

## 2018-11-07 DIAGNOSIS — S7011XA Contusion of right thigh, initial encounter: Secondary | ICD-10-CM | POA: Diagnosis not present

## 2018-11-07 DIAGNOSIS — N183 Chronic kidney disease, stage 3 (moderate): Secondary | ICD-10-CM | POA: Diagnosis not present

## 2018-11-07 DIAGNOSIS — I5032 Chronic diastolic (congestive) heart failure: Secondary | ICD-10-CM | POA: Diagnosis not present

## 2018-11-07 DIAGNOSIS — I130 Hypertensive heart and chronic kidney disease with heart failure and stage 1 through stage 4 chronic kidney disease, or unspecified chronic kidney disease: Secondary | ICD-10-CM | POA: Diagnosis not present

## 2018-11-07 LAB — GLUCOSE, CAPILLARY
Glucose-Capillary: 124 mg/dL — ABNORMAL HIGH (ref 70–99)
Glucose-Capillary: 110 mg/dL — ABNORMAL HIGH (ref 70–99)
Glucose-Capillary: 89 mg/dL (ref 70–99)
Glucose-Capillary: 85 mg/dL (ref 70–99)

## 2018-11-07 LAB — BASIC METABOLIC PANEL
Anion gap: 11 (ref 5–15)
BUN: 27 mg/dL — ABNORMAL HIGH (ref 8–23)
CO2: 24 mmol/L (ref 22–32)
CREATININE: 1.2 mg/dL — AB (ref 0.44–1.00)
Calcium: 8.1 mg/dL — ABNORMAL LOW (ref 8.9–10.3)
Chloride: 108 mmol/L (ref 98–111)
GFR calc Af Amer: 48 mL/min — ABNORMAL LOW (ref >60)
GFR, EST NON AFRICAN AMERICAN: 42 mL/min — AB (ref >60)
Glucose, Bld: 226 mg/dL — ABNORMAL HIGH (ref 70–99)
Potassium: 3.4 mmol/L — ABNORMAL LOW (ref 3.5–5.1)
Sodium: 143 mmol/L (ref 135–145)

## 2018-11-07 LAB — PROTIME-INR
INR: 2.59
Prothrombin Time: 27.4 seconds — ABNORMAL HIGH (ref 11.4–15.2)

## 2018-11-07 LAB — CBC
HCT: 30.2 % — ABNORMAL LOW (ref 36.0–46.0)
Hemoglobin: 9.1 g/dL — ABNORMAL LOW (ref 12.0–15.0)
MCH: 29.8 pg (ref 26.0–34.0)
MCHC: 30.1 g/dL (ref 30.0–36.0)
MCV: 99 fL (ref 80.0–100.0)
Platelets: 88 10*3/uL — ABNORMAL LOW (ref 150–400)
RBC: 3.05 MIL/uL — ABNORMAL LOW (ref 3.87–5.11)
RDW: 14.6 % (ref 11.5–15.5)
WBC: 3 10*3/uL — ABNORMAL LOW (ref 4.0–10.5)
nRBC: 0 % (ref 0.0–0.2)

## 2018-11-07 NOTE — Care Management (Signed)
This CM was asked by MD for benefits check for Apixaban 5mg  BID for DC. Per benefits check: Apixaban 30 day supply is $8.95 Apixaban mail order 90 day supply is $8.95.  Marney Doctor RN,BSN [phone]

## 2018-11-07 NOTE — Patient Outreach (Signed)
[place] [facility]) Care Management  11/07/2018  [name] 11/26/1932 [id number]   RN Health Coach Hospitalization  Referral Date:11/22/2016 Referral Source:[facility] Screening Reason for Referral:Disease Management/Education Insurance:[employer] Medicare   Outreach Attempt:  Received notification patient admitted to [facility] for hip pain.  Awaiting physical therapy consult.  RN Health [facility] Liaison of hospital admission.  Plan:  RN Health Coach will await recommendations from [facility].  [place] [phone] [name]

## 2018-11-07 NOTE — Progress Notes (Signed)
PROGRESS NOTE    [name]  WFU:[id number] DOB: 08-30-1933 DOA: 11/06/2018 PCP: Stephanie Chard, MD    Brief Narrative:  85 year old with past medical history relevant for paroxysmal atrial fibrillation on warfarin, history of bilateral lower extremity DVT, chronic diastolic heart failure, stage III CKD, hypoproliferative anemia, type 2 diabetes, hypertension, chronic pain on insulin admitted with inability to walk due to right hip pain and found to have hip strain as well as some intramuscular bleeding.   Assessment & Plan:   Principal Problem:   Hematoma of right iliopsoas muscle Active Problems:   Chronic diastolic heart failure (HCC)   CKD (chronic kidney disease), stage III (HCC)   HTN (hypertension)   DVT (deep venous thrombosis) (HCC)   DM (diabetes mellitus) ([place])   Warfarin-induced coagulopathy (HCC)   Intramuscular hematoma   #) Right hip strain/hematoma: Patient has some bleeding around the hip strain site. -Hold warfarin, will consider discharging on [name] - PT consult recommends skilled nursing facility -Social work consult  #) History of DVT/paroxysmal atrial fibrillation: -Continue beta-blocker - Continue amiodarone 200 mg daily -Holding anticoagulation is apparent patient is therapeutic on INR  #) Stage III CKD/hypoproliferative anemia: -Stable -Continue iron supplementation  #) Type 2 diabetes: -Sliding scale insulin, AC at bedtime -Continue Levemir 16 units every morning and 10 units nightly  #) Hypertension/hyperlipidemia: -Continue losartan 50 mg daily - Continue carvedilol 12.5 mg twice daily -Continue with statin 20 mg nightly  #) Chronic diastolic heart failure: -Continue furosemide 80 mg twice daily  Fluids: Tolerating p.o. Electrolytes: Monitor and supplement Nutrition: Carb restricted heart healthy diet   Prophylaxis: Supratherapeutic INR   Disposition: Skilled nursing facility  Full code   Consultants:   none  Procedures:    none  Antimicrobials:   none    Subjective: Patient reports she is doing much better.  She reports that her pain is almost resolved.  She continues to report significant weakness and difficulty ambulating.  She denies any nausea, vomiting, diarrhea, cough, congestion, rhinorrhea.  Objective: Vitals:   11/06/18 1830 11/06/18 1900 11/06/18 2226 11/07/18 0635  BP: (!) 152/66 (!) 158/92 (!) 161/70 (!) 127/56  Pulse: 63 63 (!) 57 (!) 54  Resp: (!) 22 20 20 16   Temp:   99 F (37.2 C)   TempSrc:   Oral   SpO2: 97% 97% 99% 97%  Weight:   82.6 kg   Height:   5\' 5"  (1.651 m)     Intake/Output Summary (Last 24 hours) at 11/07/2018 1427 Last data filed at 11/07/2018 1020 Gross per 24 hour  Intake 360 ml  Output 650 ml  Net -290 ml   Filed Weights   11/06/18 1113 11/06/18 2226  Weight: 89.4 kg 82.6 kg    Examination:  General exam: Appears calm and comfortable  Respiratory system: Clear to auscultation. Respiratory effort normal. Cardiovascular system: S regular rate and rhythm, no murmurs Gastrointestinal system: Abdomen is nondistended, soft and nontender. No organomegaly or masses felt. Normal bowel sounds heard. Central nervous system: Alert and oriented.  Grossly intact. Extremities: Bilateral lower extremity edema, right hip painful to abduction Skin: Over visible skin Psychiatry: Judgement and insight appear normal. Mood & affect appropriate.     Data Reviewed: I have personally reviewed following labs and imaging studies  CBC: Recent Labs  Lab 11/06/18 1356 11/07/18 0347  WBC 3.8* 3.0*  NEUTROABS 1.8  --   HGB 10.7* 9.1*  HCT 35.4* 30.2*  MCV 96.5 99.0  PLT 94* 88*  Basic Metabolic Panel: Recent Labs  Lab 11/06/18 1356 11/07/18 0347  NA 144 143  K 3.7 3.4*  CL 106 108  CO2 29 24  GLUCOSE 134* 226*  BUN 30* 27*  CREATININE 1.44* 1.20*  CALCIUM 8.8* 8.1*   GFR: Estimated Creatinine Clearance: 37.7 mL/min (A) (by C-G formula based on SCr of 1.2  mg/dL (H)). Liver Function Tests: Recent Labs  Lab 11/06/18 1356  AST 26  ALT 24  ALKPHOS 72  BILITOT 1.0  PROT 7.6  ALBUMIN 4.0   No results for input(s): LIPASE, AMYLASE in the last 168 hours. No results for input(s): AMMONIA in the last 168 hours. Coagulation Profile: Recent Labs  Lab 11/02/18 11/06/18 1356 11/07/18 0347  INR 2.2* 2.38 2.59   Cardiac Enzymes: No results for input(s): CKTOTAL, CKMB, CKMBINDEX, TROPONINI in the last 168 hours. BNP (last 3 results) Recent Labs    04/04/18 1025 07/06/18 1118 08/06/18 1244  PROBNP 1,189* 1,500* 1,604*   HbA1C: No results for input(s): HGBA1C in the last 72 hours. CBG: Recent Labs  Lab 11/06/18 1208 11/06/18 2329 11/07/18 0815 11/07/18 1153  GLUCAP 126* 252* 124* 110*   Lipid Profile: No results for input(s): CHOL, HDL, LDLCALC, TRIG, CHOLHDL, LDLDIRECT in the last 72 hours. Thyroid Function Tests: No results for input(s): TSH, T4TOTAL, FREET4, T3FREE, THYROIDAB in the last 72 hours. Anemia Panel: No results for input(s): VITAMINB12, FOLATE, FERRITIN, TIBC, IRON, RETICCTPCT in the last 72 hours. Sepsis Labs: No results for input(s): PROCALCITON, LATICACIDVEN in the last 168 hours.  No results found for this or any previous visit (from the past 240 hour(s)).       Radiology Studies: Ct Pelvis Wo Contrast  Result Date: 11/06/2018 CLINICAL DATA:  Right hip and groin pain which began when the patient attempted to get out of her wheelchair this morning. Initial encounter. EXAM: CT OF THE LOWER BILATERAL EXTREMITY WITHOUT CONTRAST TECHNIQUE: Multidetector CT imaging of the lower bilateral extremity was performed according to the standard protocol. COMPARISON:  None. FINDINGS: Bones/Joint/Cartilage The hips are located. No fracture is identified. Facet degenerative disease results in 0.6 cm anterolisthesis L4 on L5. There is disc bulging at both L4-5 and L5-S1. Foraminal narrowing is seen at both levels which appears  worst bilaterally at L5-S1. Mild bilateral hip osteoarthritis is noted. Sacroiliac joints and symphysis pubis appear normal. Ligaments Suboptimally assessed by CT. Muscles and Tendons There is a small volume of hemorrhage and associated swelling in the right iliopsoas near the musculotendinous junction consistent with strain. No frank tear is identified on CT scan. Soft tissues Imaged intrapelvic contents demonstrate moderate distention of the urinary bladder. The patient is status post hysterectomy. IMPRESSION: Findings consistent with strain of the right iliopsoas with some associated hemorrhage and swelling at the musculotendinous junction. Negative for fracture. Lower lumbar spondylosis. Electronically Signed   By: Inge Rise M.D.   On: 11/06/2018 18:17   Mr Brain Wo Contrast  Result Date: 11/06/2018 CLINICAL DATA:  Altered level of consciousness.  Possible stroke. EXAM: MRI HEAD WITHOUT CONTRAST TECHNIQUE: Multiplanar, multiecho pulse sequences of the brain and surrounding structures were obtained without intravenous contrast. COMPARISON:  11/17/2017 FINDINGS: Brain: Diffusion imaging does not show any acute or subacute infarction. Brainstem and cerebellum are normal. Cerebral hemispheres are normal except for very minimal small vessel change of the hemispheric deep white matter, less than often seen at this age. No cortical or large vessel territory infarction. No mass lesion, hemorrhage, hydrocephalus or extra-axial collection. Vascular: Major  vessels at the base of the brain show flow. Skull and upper cervical spine: Negative Sinuses/Orbits: Clear/normal.  Previous lens implant on the right. Other: None IMPRESSION: Normal examination for age. No acute or reversible finding. Very minimal small vessel change of the hemispheric deep white matter, less than often seen at this age. Electronically Signed   By: Nelson Chimes M.D.   On: 11/06/2018 15:02   Dg Hip Unilat  With Pelvis 2-3 Views  Right  Result Date: 11/06/2018 CLINICAL DATA:  Right hip and groin pain after attempting to get out of wheelchair this morning. EXAM: DG HIP (WITH OR WITHOUT PELVIS) 2-3V RIGHT COMPARISON:  None. FINDINGS: The bones are demineralized. No evidence of acute fracture, dislocation or femoral head avascular necrosis. The hip joint spaces are relatively preserved. There are degenerative changes within the lower lumbar spine and sacroiliac joints. Bilateral pelvic calcifications are likely phleboliths. IMPRESSION: No acute osseous findings or significant hip arthropathy. If the patient has persistent hip pain or inability to bear weight, follow up imaging may be warranted as acute hip fractures can be radiographically occult in the elderly. Lumbar spondylosis. Electronically Signed   By: Richardean Sale M.D.   On: 11/06/2018 13:13        Scheduled Meds: . amiodarone  200 mg Oral Daily  . atorvastatin  20 mg Oral QHS  . carvedilol  12.5 mg Oral BID  . ferrous gluconate  325 mg Oral Q breakfast  . furosemide  80 mg Oral BID  . insulin aspart  0-9 Units Subcutaneous TID WC  . insulin detemir  10 Units Subcutaneous QHS  . insulin detemir  16 Units Subcutaneous Daily  . losartan  50 mg Oral Daily  . magnesium oxide  400 mg Oral Daily  . oxybutynin  5 mg Oral BID  . pantoprazole  40 mg Oral Daily  . potassium chloride  20 mEq Oral BID  . pregabalin  75 mg Oral QHS   Continuous Infusions:   LOS: 0 days    Time spent: [place], MD [facility]  If 7PM-7AM, please contact night-coverage [email or web address] Password [facility] 11/07/2018, 2:27 PM

## 2018-11-07 NOTE — Consult Note (Signed)
   [facility] CM Inpatient Consult   11/07/2018  IANTHA TITSWORTH [birth year] [id number]    Mrs. Hallett is active with [place] Management program services. She is active with [facility].  Went to Mrs. Reust's room. However, she was just  transferred to a different unit. Will follow up at later time.    Marthenia Rolling, MSN-Ed, RN,BSN [facility] Liaison [phone]

## 2018-11-07 NOTE — Evaluation (Signed)
Physical Therapy Evaluation Patient Details Name: [name] MRN: [id number] DOB: [birth year] Today's Date: 11/07/2018   History of Present Illness  85 yo female admitted with R iliopsoas muscle hematoma and strain. Hx of CVA, A fib, DVT, obesity, CHF, CKD, chronic anemia, DM, LBBB  Clinical Impression  On eval, pt required Mod assist for mobility. She was able to stand and take a few lateral steps along the side of the bed with a RW. Pt reported 7/10 pain with activity. She is at risk for falling when mobilizing. Discussed d/c plan with pt and family during session. Pt prefers to d/c home but she understands that rehab may be necessary and that it is likely the safest plan. Recommend ST SNF for continued therapy.     Follow Up Recommendations SNF    Equipment Recommendations  None recommended by PT    Recommendations for Other Services       Precautions / Restrictions Precautions Precautions: Fall Restrictions Weight Bearing Restrictions: No      Mobility  Bed Mobility Overal bed mobility: Needs Assistance Bed Mobility: Supine to Sit;Sit to Supine     Supine to sit: Min assist;HOB elevated Sit to supine: Mod assist;HOB elevated   General bed mobility comments: Assist for trunk and bil LEs. Increased time.   Transfers Overall transfer level: Needs assistance Equipment used: Rolling walker (2 wheeled) Transfers: Sit to/from Stand Sit to Stand: Mod assist;From elevated surface         General transfer comment: Assist to rise, stabilize, control descent. Increased time. VCs safety, technique, hand placement  Ambulation/Gait Ambulation/Gait assistance: Min assist           General Gait Details: Side steps along side of bed x 4. Pt also took 1 small shuffle step forwards then backwards. She has significant difficulty lifting and advancing R LE. Pt declined transfer to recliner.   Stairs            Wheelchair Mobility    Modified Rankin (Stroke Patients  Only)       Balance Overall balance assessment: Needs assistance;History of Falls         Standing balance support: Bilateral upper extremity supported Standing balance-Leahy Scale: Poor                               Pertinent Vitals/Pain Pain Assessment: 0-10 Pain Score: 7  Pain Location: R LE Pain Descriptors / Indicators: Aching;Tender;Sore;Discomfort Pain Intervention(s): Limited activity within patient's tolerance;Repositioned    Home Living Family/patient expects to be discharged to:: Unsure Living Arrangements: Alone Available Help at Discharge: Family;Available PRN/intermittently Type of Home: House Home Access: Stairs to enter Entrance Stairs-Rails: None Entrance Stairs-Number of Steps: 2 Home Layout: One level Home Equipment: Walker - 4 wheels;Wheelchair - manual      Prior Function Level of Independence: Needs assistance   Gait / Transfers Assistance Needed: ambulatory with RW vs wheelchair  ADL's / Homemaking Assistance Needed: assistance for bathing, dressing        Hand Dominance        Extremity/Trunk Assessment   Upper Extremity Assessment Upper Extremity Assessment: Generalized weakness    Lower Extremity Assessment Lower Extremity Assessment: Generalized weakness    Cervical / Trunk Assessment Cervical / Trunk Assessment: Kyphotic  Communication   Communication: No difficulties  Cognition Arousal/Alertness: Awake/alert Behavior During Therapy: WFL for tasks assessed/performed Overall Cognitive Status: Within Functional Limits for tasks assessed  General Comments      Exercises     Assessment/Plan    PT Assessment Patient needs continued PT services  PT Problem List Decreased strength;Decreased balance;Decreased mobility;Decreased activity tolerance;Pain;Decreased knowledge of use of DME       PT Treatment Interventions DME instruction;Gait  training;Functional mobility training;Balance training;Patient/family education;Therapeutic exercise;Therapeutic activities    PT Goals (Current goals can be found in the Care Plan section)  Acute Rehab PT Goals Patient Stated Goal: less pain. to d/c home PT Goal Formulation: With patient Time For Goal Achievement: 11/21/18 Potential to Achieve Goals: Good    Frequency Min 3X/week   Barriers to discharge        Co-evaluation               AM-PAC PT "6 Clicks" Mobility  Outcome Measure Help needed turning from your back to your side while in a flat bed without using bedrails?: A Little Help needed moving from lying on your back to sitting on the side of a flat bed without using bedrails?: A Lot Help needed moving to and from a bed to a chair (including a wheelchair)?: A Lot Help needed standing up from a chair using your arms (e.g., wheelchair or bedside chair)?: A Lot Help needed to walk in hospital room?: Total Help needed climbing 3-5 steps with a railing? : Total 6 Click Score: 11    End of Session Equipment Utilized During Treatment: Gait belt Activity Tolerance: Patient limited by fatigue;Patient limited by pain Patient left: in bed;with call bell/phone within reach;with bed alarm set;with family/visitor present   PT Visit Diagnosis: Difficulty in walking, not elsewhere classified (R26.2);Muscle weakness (generalized) (M62.81);Pain;Unsteadiness on feet (R26.81);History of falling (Z91.81) Pain - Right/Left: Right Pain - part of body: Leg    Time: 4008-6761 PT Time Calculation (min) (ACUTE ONLY): 24 min   Charges:   PT Evaluation $PT Eval Moderate Complexity: 1 Mod PT Treatments $Gait Training: 8-22 mins          Weston Anna, PT Acute Rehabilitation Services Pager: [phone] Office: [phone]

## 2018-11-08 DIAGNOSIS — S7011XA Contusion of right thigh, initial encounter: Secondary | ICD-10-CM | POA: Diagnosis not present

## 2018-11-08 LAB — CBC
HCT: 32.4 % — ABNORMAL LOW (ref 36.0–46.0)
Hemoglobin: 9.9 g/dL — ABNORMAL LOW (ref 12.0–15.0)
MCH: 30 pg (ref 26.0–34.0)
MCHC: 30.6 g/dL (ref 30.0–36.0)
MCV: 98.2 fL (ref 80.0–100.0)
Platelets: 87 10*3/uL — ABNORMAL LOW (ref 150–400)
RBC: 3.3 MIL/uL — ABNORMAL LOW (ref 3.87–5.11)
RDW: 14.6 % (ref 11.5–15.5)
WBC: 3.1 10*3/uL — ABNORMAL LOW (ref 4.0–10.5)
nRBC: 0 % (ref 0.0–0.2)

## 2018-11-08 LAB — GLUCOSE, CAPILLARY
Glucose-Capillary: 71 mg/dL (ref 70–99)
Glucose-Capillary: 255 mg/dL — ABNORMAL HIGH (ref 70–99)
Glucose-Capillary: 162 mg/dL — ABNORMAL HIGH (ref 70–99)
Glucose-Capillary: 117 mg/dL — ABNORMAL HIGH (ref 70–99)

## 2018-11-08 LAB — B. BURGDORFI ANTIBODIES: B burgdorferi Ab IgG+IgM: <0.91 {ISR} (ref 0.00–0.90)

## 2018-11-08 NOTE — NC FL2 (Signed)
[place] LEVEL OF CARE SCREENING TOOL     IDENTIFICATION  Patient Name: [name] Birthdate: [birth year] Sex: female Admission Date (Current Location): 11/06/2018  [facility] and [place] Number:  Herbalist and Address:  [facility],  [place] [address] [place]      Provider Number: [id number]  Attending Physician Name and Address:  Cristy Folks, MD  Relative Name and Phone Number:  [name], [phone]    Current Level of Care: Hospital Recommended Level of Care: [place] Prior Approval Number:    Date Approved/Denied:   PASRR Number: [id number]  Discharge Plan: SNF    Current Diagnoses: Patient Active Problem List   Diagnosis Date Noted  . Hematoma of right iliopsoas muscle 11/06/2018  . Intramuscular hematoma 11/06/2018  . Hypertensive heart disease with heart failure ([place]) 09/05/2017  . Aphasia 08/31/2017  . Dysphagia 08/31/2017  . CKD (chronic kidney disease), stage III ([place])   . LBBB (left bundle branch block)   . Anemia   . HTN (hypertension)   . Obesity   . DVT (deep venous thrombosis) ([place])   . DM (diabetes mellitus) ([place]) 07/28/2015  . Fall 07/28/2015  . Warfarin-induced coagulopathy ([place]) 07/28/2015  . Chronic diastolic heart failure ([place]) 03/19/2014  . Edema 08/06/2013  . SVT (supraventricular tachycardia) ([place]) 10/15/2012  . First degree AV block 10/15/2012  . Acute renal insufficiency 10/14/2012  . Dizziness and giddiness   . Thrombocytopenia ([place]) 11/21/2011    Orientation RESPIRATION BLADDER Height & Weight     Self, Time, Situation, Place  Normal External catheter, Continent Weight: 179 lb 3.7 oz (81.3 kg) Height:  5\' 5"  (165.1 cm)  BEHAVIORAL SYMPTOMS/MOOD NEUROLOGICAL BOWEL NUTRITION STATUS      Continent Diet(heart healthy/carb modified)  AMBULATORY STATUS COMMUNICATION OF NEEDS Skin   Limited Assist Verbally                         Personal Care  Assistance Level of Assistance  Bathing, Feeding, Dressing Bathing Assistance: Limited assistance Feeding assistance: Independent Dressing Assistance: Limited assistance     Functional Limitations Info  Sight, Hearing, Speech Sight Info: Adequate Hearing Info: Impaired Speech Info: Adequate    SPECIAL CARE FACTORS FREQUENCY  PT (By licensed PT), OT (By licensed OT)     PT Frequency: 5x wk OT Frequency: 5x wk            Contractures Contractures Info: Not present    Additional Factors Info  Code Status, Allergies Code Status Info: Full Code Allergies Info: ASPIRIN, PENICILLINS           Current Medications (11/08/2018):  This is the current hospital active medication list Current Facility-Administered Medications  Medication Dose Route Frequency Provider Last Rate Last Dose  . acetaminophen (TYLENOL) tablet 650 mg  650 mg Oral Q6H PRN Rise Patience, MD       Or  . acetaminophen (TYLENOL) suppository 650 mg  650 mg Rectal Q6H PRN Rise Patience, MD      . amiodarone (PACERONE) tablet 200 mg  200 mg Oral Daily Rise Patience, MD   200 mg at 11/08/18 0955  . atorvastatin (LIPITOR) tablet 20 mg  20 mg Oral QHS Rise Patience, MD   20 mg at 11/07/18 2238  . carvedilol (COREG) tablet 12.5 mg  12.5 mg Oral BID Rise Patience, MD   12.5 mg at 11/08/18 0954  .  ferrous gluconate (FERGON) tablet 325 mg  325 mg Oral Q breakfast Rise Patience, MD   325 mg at 11/08/18 0747  . furosemide (LASIX) tablet 80 mg  80 mg Oral BID Rise Patience, MD   80 mg at 11/08/18 0747  . insulin aspart (novoLOG) injection 0-9 Units  0-9 Units Subcutaneous TID WC Rise Patience, MD   1 Units at 11/07/18 0920  . insulin detemir (LEVEMIR) injection 10 Units  10 Units Subcutaneous QHS Rise Patience, MD   10 Units at 11/07/18 2241  . insulin detemir (LEVEMIR) injection 16 Units  16 Units Subcutaneous Daily Rise Patience, MD   16 Units at 11/08/18  0955  . losartan (COZAAR) tablet 50 mg  50 mg Oral Daily Rise Patience, MD   50 mg at 11/08/18 0954  . magnesium oxide (MAG-OX) tablet 400 mg  400 mg Oral Daily Rise Patience, MD   400 mg at 11/08/18 0954  . ondansetron (ZOFRAN) tablet 4 mg  4 mg Oral Q6H PRN Rise Patience, MD       Or  . ondansetron [facility]) injection 4 mg  4 mg Intravenous Q6H PRN Rise Patience, MD      . oxybutynin (DITROPAN) tablet 5 mg  5 mg Oral BID Rise Patience, MD   5 mg at 11/08/18 0955  . pantoprazole (PROTONIX) EC tablet 40 mg  40 mg Oral Daily Rise Patience, MD   40 mg at 11/08/18 0954  . potassium chloride (K-DUR,KLOR-CON) CR tablet 20 mEq  20 mEq Oral BID Rise Patience, MD   20 mEq at 11/08/18 0955  . pregabalin (LYRICA) capsule 75 mg  75 mg Oral QHS Rise Patience, MD   75 mg at 11/07/18 2238     Discharge Medications: Please see discharge summary for a list of discharge medications.  Relevant Imaging Results:  Relevant Lab Results:   Additional Information SS# [id number]  Wende Neighbors, LCSW

## 2018-11-08 NOTE — Consult Note (Signed)
   [facility] CM Inpatient Consult   11/08/2018  AYSLIN KUNDERT 09/27/1933 [id number]     [facility] Care Management hospital liaison follow up.  Spoke with inpatient RNCM to confirm disposition plans are for SNF.   Will continue to follow along and make appropriate [facility] referral to [facility] LCSW once disposition facility is known.   Marthenia Rolling, MSN-Ed, RN,BSN [facility] Liaison [phone]

## 2018-11-08 NOTE — Progress Notes (Signed)
Report received from previous RN, Lars Mage. I agree with previous RN's assessment and will continue to monitor pt closely. Carnella [place] I

## 2018-11-08 NOTE — Clinical Social Work Note (Signed)
Clinical Social Work Assessment  Patient Details  Name: [name] MRN: [id number] Date of Birth: November 15, 1932  Date of referral:  11/08/18               Reason for consult:  Facility Placement, Discharge Planning                Permission sought to share information with:  Family Supports Permission granted to share information::  Yes, Verbal Permission Granted  Name::        Agency::     Relationship::     Contact Information:     Housing/Transportation Living arrangements for the past 2 months:  Single Family Home Source of Information:  Patient Patient Interpreter Needed:  None Criminal Activity/Legal Involvement Pertinent to Current Situation/Hospitalization:  No - Comment as needed Significant Relationships:  Adult Children, Other Family Members Lives with:  Self Do you feel safe going back to the place where you live?  Yes Need for family participation in patient care:  Yes (Comment)  Care giving concerns:  Patient daughter at bedside. Patient stated she lives at home alone but has support from her daughter and other family members in the area. Patient stated she will be moving in with daughter after rehab.   Social Worker assessment / plan:  CSW met patient and family at bedside to discuss discharge plans. Patient stated she has been to rehab twice in the past and is aware of the process. Family gave CSW permission to fax patient out to [place] and [place] area. Family requested to talk to chaplain to do POA paperwork while. CSW made RN aware and she placed consult for chaplain. CSW will need to get prior authorization through [facility] before patient can go to rehab  Employment status:  Retired Forensic scientist:  Managed Care PT Recommendations:  [place] / Referral to community resources:  [place]  Patient/Family's Response to care:  Family pleasant and supportive of patients needs  Patient/Family's  Understanding of and Emotional Response to Diagnosis, Current Treatment, and Prognosis:  Family agreeable with discharge plans to rehab  Emotional Assessment Appearance:  Appears stated age Attitude/Demeanor/Rapport:  Engaged Affect (typically observed):  Accepting, Pleasant Orientation:  Oriented to Place, Oriented to Self, Oriented to  Time, Oriented to Situation Alcohol / Substance use:  Not Applicable Psych involvement (Current and /or in the community):  No (Comment)  Discharge Needs  Concerns to be addressed:  Care Coordination Readmission within the last 30 days:    Current discharge risk:  Dependent with Mobility Barriers to Discharge:  [place], LCSW 11/08/2018, 12:17 PM

## 2018-11-08 NOTE — Progress Notes (Addendum)
PROGRESS NOTE    [name]  NFA:[id number] DOB: 1933-01-14 DOA: 11/06/2018 PCP: [name], MD    Brief Narrative:  85 year old with past medical history relevant for paroxysmal atrial fibrillation on warfarin, history of bilateral lower extremity DVT, chronic diastolic heart failure, stage III CKD, hypoproliferative anemia, type 2 diabetes, hypertension, chronic pain on insulin admitted with inability to walk due to right hip pain and found to have hip strain as well as some intramuscular bleeding.   Assessment & Plan:   Principal Problem:   Hematoma of right iliopsoas muscle Active Problems:   Chronic diastolic heart failure (HCC)   CKD (chronic kidney disease), stage III (HCC)   HTN (hypertension)   DVT (deep venous thrombosis) (HCC)   DM (diabetes mellitus) ([place])   Warfarin-induced coagulopathy (HCC)   Intramuscular hematoma   #) Right hip strain/hematoma: Patient is unable to ambulate -Hold warfarin, will consider discharging on NOAC - PT consult recommends skilled nursing facility -Social work consult for placement  #) History of DVT/paroxysmal atrial fibrillation/h/o CVA: -Continue beta-blocker - Continue amiodarone 200 mg daily -Holding anticoagulation is apparent patient is therapeutic on INR, will transition to apixaban on discharge  #) Stage III CKD/hypoproliferative anemia: -Stable -Continue iron supplementation  #) Type 2 diabetes: -Sliding scale insulin, AC at bedtime -Continue Levemir 16 units every morning and 10 units nightly  #) Hypertension/hyperlipidemia: -Continue losartan 50 mg daily - Continue carvedilol 12.5 mg twice daily -Continue with statin 20 mg nightly  #) Chronic diastolic heart failure: -Continue furosemide 80 mg twice daily  #) Breast mass: Patient had a breast biopsy scheduled for 11/06/2018 but was admitted to the ER for her hip strain. -Outpatient follow-up for this  Fluids: Tolerating p.o. Electrolytes: Monitor and  supplement Nutrition: Carb restricted heart healthy diet   Prophylaxis: elevated INR  Disposition: Skilled nursing facility  Full code   Consultants:   none  Procedures:   none  Antimicrobials:   none    Subjective: Patient reports she is doing much better.  She has minimal pain but continues to have significant difficulty walking.  She denies any nausea, vomiting, diarrhea, cough, congestion.  Objective: Vitals:   11/07/18 1734 11/07/18 2122 11/08/18 0544 11/08/18 0600  BP: (!) 119/57 137/63 (!) 146/62   Pulse: 61 61 (!) 56   Resp:  16 16   Temp:  99.8 F (37.7 C) 98.9 F (37.2 C)   TempSrc:  Oral Oral   SpO2:  95% 96%   Weight:    81.3 kg  Height:        Intake/Output Summary (Last 24 hours) at 11/08/2018 0927 Last data filed at 11/08/2018 0539 Gross per 24 hour  Intake 720 ml  Output 2900 ml  Net -2180 ml   Filed Weights   11/06/18 1113 11/06/18 2226 11/08/18 0600  Weight: 89.4 kg 82.6 kg 81.3 kg    Examination:  General exam: Appears calm and comfortable  Respiratory system: Clear to auscultation. Respiratory effort normal. Cardiovascular system: S regular rate and rhythm, no murmurs Gastrointestinal system: Abdomen is nondistended, soft and nontender. No organomegaly or masses felt. Normal bowel sounds heard. Central nervous system: Alert and oriented.  Grossly intact. Extremities: Bilateral lower extremity edema, right hip painful to abduction Skin: Over visible skin Psychiatry: Judgement and insight appear normal. Mood & affect appropriate.     Data Reviewed: I have personally reviewed following labs and imaging studies  CBC: Recent Labs  Lab 11/06/18 1356 11/07/18 0347 11/08/18 0508  WBC  3.8* 3.0* 3.1*  NEUTROABS 1.8  --   --   HGB 10.7* 9.1* 9.9*  HCT 35.4* 30.2* 32.4*  MCV 96.5 99.0 98.2  PLT 94* 88* 87*   Basic Metabolic Panel: Recent Labs  Lab 11/06/18 1356 11/07/18 0347  NA 144 143  K 3.7 3.4*  CL 106 108  CO2 29 24   GLUCOSE 134* 226*  BUN 30* 27*  CREATININE 1.44* 1.20*  CALCIUM 8.8* 8.1*   GFR: Estimated Creatinine Clearance: 37.4 mL/min (A) (by C-G formula based on SCr of 1.2 mg/dL (H)). Liver Function Tests: Recent Labs  Lab 11/06/18 1356  AST 26  ALT 24  ALKPHOS 72  BILITOT 1.0  PROT 7.6  ALBUMIN 4.0   No results for input(s): LIPASE, AMYLASE in the last 168 hours. No results for input(s): AMMONIA in the last 168 hours. Coagulation Profile: Recent Labs  Lab 11/02/18 11/06/18 1356 11/07/18 0347  INR 2.2* 2.38 2.59   Cardiac Enzymes: No results for input(s): CKTOTAL, CKMB, CKMBINDEX, TROPONINI in the last 168 hours. BNP (last 3 results) Recent Labs    04/04/18 1025 07/06/18 1118 08/06/18 1244  PROBNP 1,189* 1,500* 1,604*   HbA1C: No results for input(s): HGBA1C in the last 72 hours. CBG: Recent Labs  Lab 11/07/18 0815 11/07/18 1153 11/07/18 1734 11/07/18 2226 11/08/18 0742  GLUCAP 124* 110* 89 85 71   Lipid Profile: No results for input(s): CHOL, HDL, LDLCALC, TRIG, CHOLHDL, LDLDIRECT in the last 72 hours. Thyroid Function Tests: No results for input(s): TSH, T4TOTAL, FREET4, T3FREE, THYROIDAB in the last 72 hours. Anemia Panel: No results for input(s): VITAMINB12, FOLATE, FERRITIN, TIBC, IRON, RETICCTPCT in the last 72 hours. Sepsis Labs: No results for input(s): PROCALCITON, LATICACIDVEN in the last 168 hours.  No results found for this or any previous visit (from the past 240 hour(s)).       Radiology Studies: Ct Pelvis Wo Contrast  Result Date: 11/06/2018 CLINICAL DATA:  Right hip and groin pain which began when the patient attempted to get out of her wheelchair this morning. Initial encounter. EXAM: CT OF THE LOWER BILATERAL EXTREMITY WITHOUT CONTRAST TECHNIQUE: Multidetector CT imaging of the lower bilateral extremity was performed according to the standard protocol. COMPARISON:  None. FINDINGS: Bones/Joint/Cartilage The hips are located. No fracture  is identified. Facet degenerative disease results in 0.6 cm anterolisthesis L4 on L5. There is disc bulging at both L4-5 and L5-S1. Foraminal narrowing is seen at both levels which appears worst bilaterally at L5-S1. Mild bilateral hip osteoarthritis is noted. Sacroiliac joints and symphysis pubis appear normal. Ligaments Suboptimally assessed by CT. Muscles and Tendons There is a small volume of hemorrhage and associated swelling in the right iliopsoas near the musculotendinous junction consistent with strain. No frank tear is identified on CT scan. Soft tissues Imaged intrapelvic contents demonstrate moderate distention of the urinary bladder. The patient is status post hysterectomy. IMPRESSION: Findings consistent with strain of the right iliopsoas with some associated hemorrhage and swelling at the musculotendinous junction. Negative for fracture. Lower lumbar spondylosis. Electronically Signed   By: Inge Rise M.D.   On: 11/06/2018 18:17   Mr Brain Wo Contrast  Result Date: 11/06/2018 CLINICAL DATA:  Altered level of consciousness.  Possible stroke. EXAM: MRI HEAD WITHOUT CONTRAST TECHNIQUE: Multiplanar, multiecho pulse sequences of the brain and surrounding structures were obtained without intravenous contrast. COMPARISON:  11/17/2017 FINDINGS: Brain: Diffusion imaging does not show any acute or subacute infarction. Brainstem and cerebellum are normal. Cerebral hemispheres are normal  except for very minimal small vessel change of the hemispheric deep white matter, less than often seen at this age. No cortical or large vessel territory infarction. No mass lesion, hemorrhage, hydrocephalus or extra-axial collection. Vascular: Major vessels at the base of the brain show flow. Skull and upper cervical spine: Negative Sinuses/Orbits: Clear/normal.  Previous lens implant on the right. Other: None IMPRESSION: Normal examination for age. No acute or reversible finding. Very minimal small vessel change of the  hemispheric deep white matter, less than often seen at this age. Electronically Signed   By: Nelson Chimes M.D.   On: 11/06/2018 15:02   Dg Hip Unilat  With Pelvis 2-3 Views Right  Result Date: 11/06/2018 CLINICAL DATA:  Right hip and groin pain after attempting to get out of wheelchair this morning. EXAM: DG HIP (WITH OR WITHOUT PELVIS) 2-3V RIGHT COMPARISON:  None. FINDINGS: The bones are demineralized. No evidence of acute fracture, dislocation or femoral head avascular necrosis. The hip joint spaces are relatively preserved. There are degenerative changes within the lower lumbar spine and sacroiliac joints. Bilateral pelvic calcifications are likely phleboliths. IMPRESSION: No acute osseous findings or significant hip arthropathy. If the patient has persistent hip pain or inability to bear weight, follow up imaging may be warranted as acute hip fractures can be radiographically occult in the elderly. Lumbar spondylosis. Electronically Signed   By: Richardean Sale M.D.   On: 11/06/2018 13:13        Scheduled Meds: . amiodarone  200 mg Oral Daily  . atorvastatin  20 mg Oral QHS  . carvedilol  12.5 mg Oral BID  . ferrous gluconate  325 mg Oral Q breakfast  . furosemide  80 mg Oral BID  . insulin aspart  0-9 Units Subcutaneous TID WC  . insulin detemir  10 Units Subcutaneous QHS  . insulin detemir  16 Units Subcutaneous Daily  . losartan  50 mg Oral Daily  . magnesium oxide  400 mg Oral Daily  . oxybutynin  5 mg Oral BID  . pantoprazole  40 mg Oral Daily  . potassium chloride  20 mEq Oral BID  . pregabalin  75 mg Oral QHS   Continuous Infusions:   LOS: 0 days    Time spent: [place], MD [facility]  If 7PM-7AM, please contact night-coverage [email or web address] Password [facility] [place] 11/08/2018, 9:27 AM

## 2018-11-09 ENCOUNTER — Encounter: Payer: Self-pay | Admitting: Internal Medicine

## 2018-11-09 DIAGNOSIS — I480 Paroxysmal atrial fibrillation: Secondary | ICD-10-CM | POA: Diagnosis not present

## 2018-11-09 DIAGNOSIS — M6281 Muscle weakness (generalized): Secondary | ICD-10-CM | POA: Diagnosis not present

## 2018-11-09 DIAGNOSIS — Z170 Estrogen receptor positive status [ER+]: Secondary | ICD-10-CM | POA: Diagnosis not present

## 2018-11-09 DIAGNOSIS — I5032 Chronic diastolic (congestive) heart failure: Secondary | ICD-10-CM | POA: Diagnosis not present

## 2018-11-09 DIAGNOSIS — Z741 Need for assistance with personal care: Secondary | ICD-10-CM | POA: Diagnosis not present

## 2018-11-09 DIAGNOSIS — S76011A Strain of muscle, fascia and tendon of right hip, initial encounter: Secondary | ICD-10-CM | POA: Diagnosis not present

## 2018-11-09 DIAGNOSIS — Z7901 Long term (current) use of anticoagulants: Secondary | ICD-10-CM | POA: Diagnosis not present

## 2018-11-09 DIAGNOSIS — M2550 Pain in unspecified joint: Secondary | ICD-10-CM | POA: Diagnosis not present

## 2018-11-09 DIAGNOSIS — Z7401 Bed confinement status: Secondary | ICD-10-CM | POA: Diagnosis not present

## 2018-11-09 DIAGNOSIS — N6320 Unspecified lump in the left breast, unspecified quadrant: Secondary | ICD-10-CM | POA: Diagnosis not present

## 2018-11-09 DIAGNOSIS — E119 Type 2 diabetes mellitus without complications: Secondary | ICD-10-CM | POA: Diagnosis not present

## 2018-11-09 DIAGNOSIS — Z86718 Personal history of other venous thrombosis and embolism: Secondary | ICD-10-CM | POA: Diagnosis not present

## 2018-11-09 DIAGNOSIS — Z79899 Other long term (current) drug therapy: Secondary | ICD-10-CM | POA: Diagnosis not present

## 2018-11-09 DIAGNOSIS — S76011D Strain of muscle, fascia and tendon of right hip, subsequent encounter: Secondary | ICD-10-CM | POA: Diagnosis not present

## 2018-11-09 DIAGNOSIS — E1122 Type 2 diabetes mellitus with diabetic chronic kidney disease: Secondary | ICD-10-CM | POA: Diagnosis not present

## 2018-11-09 DIAGNOSIS — I1310 Hypertensive heart and chronic kidney disease without heart failure, with stage 1 through stage 4 chronic kidney disease, or unspecified chronic kidney disease: Secondary | ICD-10-CM | POA: Diagnosis not present

## 2018-11-09 DIAGNOSIS — C50311 Malignant neoplasm of lower-inner quadrant of right female breast: Secondary | ICD-10-CM | POA: Diagnosis not present

## 2018-11-09 DIAGNOSIS — D619 Aplastic anemia, unspecified: Secondary | ICD-10-CM | POA: Diagnosis not present

## 2018-11-09 DIAGNOSIS — N189 Chronic kidney disease, unspecified: Secondary | ICD-10-CM | POA: Diagnosis not present

## 2018-11-09 DIAGNOSIS — M160 Bilateral primary osteoarthritis of hip: Secondary | ICD-10-CM | POA: Diagnosis not present

## 2018-11-09 DIAGNOSIS — M4306 Spondylolysis, lumbar region: Secondary | ICD-10-CM | POA: Diagnosis not present

## 2018-11-09 DIAGNOSIS — I471 Supraventricular tachycardia: Secondary | ICD-10-CM | POA: Diagnosis not present

## 2018-11-09 DIAGNOSIS — N183 Chronic kidney disease, stage 3 (moderate): Secondary | ICD-10-CM | POA: Diagnosis not present

## 2018-11-09 DIAGNOSIS — M25611 Stiffness of right shoulder, not elsewhere classified: Secondary | ICD-10-CM | POA: Diagnosis not present

## 2018-11-09 DIAGNOSIS — R2681 Unsteadiness on feet: Secondary | ICD-10-CM | POA: Diagnosis not present

## 2018-11-09 DIAGNOSIS — Z794 Long term (current) use of insulin: Secondary | ICD-10-CM | POA: Diagnosis not present

## 2018-11-09 DIAGNOSIS — R293 Abnormal posture: Secondary | ICD-10-CM | POA: Diagnosis not present

## 2018-11-09 DIAGNOSIS — Z8673 Personal history of transient ischemic attack (TIA), and cerebral infarction without residual deficits: Secondary | ICD-10-CM | POA: Diagnosis not present

## 2018-11-09 DIAGNOSIS — N6314 Unspecified lump in the right breast, lower inner quadrant: Secondary | ICD-10-CM | POA: Diagnosis not present

## 2018-11-09 DIAGNOSIS — I130 Hypertensive heart and chronic kidney disease with heart failure and stage 1 through stage 4 chronic kidney disease, or unspecified chronic kidney disease: Secondary | ICD-10-CM | POA: Diagnosis not present

## 2018-11-09 DIAGNOSIS — I509 Heart failure, unspecified: Secondary | ICD-10-CM | POA: Diagnosis not present

## 2018-11-09 DIAGNOSIS — N6310 Unspecified lump in the right breast, unspecified quadrant: Secondary | ICD-10-CM | POA: Diagnosis not present

## 2018-11-09 DIAGNOSIS — Z803 Family history of malignant neoplasm of breast: Secondary | ICD-10-CM | POA: Diagnosis not present

## 2018-11-09 DIAGNOSIS — D539 Nutritional anemia, unspecified: Secondary | ICD-10-CM | POA: Diagnosis not present

## 2018-11-09 DIAGNOSIS — M7981 Nontraumatic hematoma of soft tissue: Secondary | ICD-10-CM | POA: Diagnosis not present

## 2018-11-09 DIAGNOSIS — S7011XA Contusion of right thigh, initial encounter: Secondary | ICD-10-CM | POA: Diagnosis not present

## 2018-11-09 DIAGNOSIS — I4891 Unspecified atrial fibrillation: Secondary | ICD-10-CM | POA: Diagnosis not present

## 2018-11-09 DIAGNOSIS — C50911 Malignant neoplasm of unspecified site of right female breast: Secondary | ICD-10-CM | POA: Diagnosis not present

## 2018-11-09 DIAGNOSIS — E785 Hyperlipidemia, unspecified: Secondary | ICD-10-CM | POA: Diagnosis not present

## 2018-11-09 LAB — PROTIME-INR
INR: 2.12
Prothrombin Time: 23.5 seconds — ABNORMAL HIGH (ref 11.4–15.2)

## 2018-11-09 LAB — GLUCOSE, CAPILLARY
Glucose-Capillary: 84 mg/dL (ref 70–99)
Glucose-Capillary: 253 mg/dL — ABNORMAL HIGH (ref 70–99)
Glucose-Capillary: 115 mg/dL — ABNORMAL HIGH (ref 70–99)

## 2018-11-09 MED ORDER — MAGNESIUM CITRATE PO SOLN
1.0000 | Freq: Once | ORAL | Status: AC
  Administered 2018-11-09: 1 via ORAL
  Filled 2018-11-09: qty 296

## 2018-11-09 MED ORDER — APIXABAN 5 MG PO TABS: 5.0000 mg | ORAL_TABLET | Freq: Two times a day (BID) | ORAL | 0 refills | Status: DC

## 2018-11-09 NOTE — Progress Notes (Signed)
Patient discharged to facility via ambulance, discharge packet prepared by CSW and given to EMS transporter for facility. Report called to facility. Patient denies any pain/distress. Daughter at the bedside during transportation to facility.

## 2018-11-09 NOTE — Discharge Summary (Signed)
Physician Discharge Summary  [name] DVV:[id number] DOB: 04-Apr-1933 DOA: 11/06/2018  PCP: [name], MD  Admit date: 11/06/2018 Discharge date: 11/09/2018  Admitted From: Home Disposition:  SNF  Recommendations for Outpatient Follow-up:  1. Follow up with PCP in 1-2 weeks 2. Please obtain BMP/CBC in one week 3. Please check INR and start apixaban when INR drops below 2  Home Health: No Equipment/Devices:none  Discharge Condition: stable CODE STATUS: FULL Diet recommendation: Heart Healthy / Carb Modified   Brief/Interim Summary:  #) Right hip strain/hematoma: Patient was admitted with severe right hip strain and pain.  Imaging showed evidence of small hematoma as well as strain.  Patient's warfarin was held.  She was seen by physical therapy recommended discharge to skilled nursing facility.  #) History of DVT/paroxysmal atrial fibrillation/history of CVA: Patient was continued on beta-blocker, amiodarone.  Patient's warfarin was held and discontinued on discharge.  Her INR was still over 2 and so apixaban was not started but patient may start apixaban once her INR falls below 2.  #) Stage III CKD/hypoproliferative anemia: This was stable during this hospitalization.  Patient was continued on iron supplementation.  #) Type 2 diabetes: Patient was continued on Levemir 16 units every morning and 10 units nightly.  She was maintained on sliding scale insulin.  She was given a carb restricted diet.  #) Hypertension/hyperlipidemia: Patient was continued on home losartan, carvedilol, statin.  #) Chronic diastolic heart failure: Patient was continued on home furosemide 80 mg twice daily  #) Breast mass: Patient was scheduled for an outpatient breast biopsy on 11/06/2018 but was instead admitted to the emergency room after her hip strain and hematoma.  She will need outpatient follow-up for this.  Discharge Diagnoses:  Principal Problem:   Hematoma of right iliopsoas  muscle Active Problems:   Chronic diastolic heart failure (HCC)   CKD (chronic kidney disease), stage III (HCC)   HTN (hypertension)   DVT (deep venous thrombosis) (HCC)   DM (diabetes mellitus) ([place])   Warfarin-induced coagulopathy (HCC)   Intramuscular hematoma    Discharge Instructions  Discharge Instructions    Call MD for:  difficulty breathing, headache or visual disturbances   Complete by:  As directed    Call MD for:  extreme fatigue   Complete by:  As directed    Call MD for:  hives   Complete by:  As directed    Call MD for:  persistant dizziness or light-headedness   Complete by:  As directed    Call MD for:  persistant nausea and vomiting   Complete by:  As directed    Call MD for:  redness, tenderness, or signs of infection (pain, swelling, redness, odor or green/yellow discharge around incision site)   Complete by:  As directed    Call MD for:  severe uncontrolled pain   Complete by:  As directed    Call MD for:  temperature >100.4   Complete by:  As directed    Diet - low sodium heart healthy   Complete by:  As directed    Discharge instructions   Complete by:  As directed    Please start the apixaban when your INR has dropped below 2.   Increase activity slowly   Complete by:  As directed      Allergies as of 11/09/2018      Reactions   Aspirin Itching   Penicillins Itching, Rash   DID THE REACTION INVOLVE: Swelling of the face/tongue/throat, SOB,  or low BP? No Sudden or severe rash/hives, skin peeling, or the inside of the mouth or nose? Yes Did it require medical treatment? yes When did it last happen?more than 10 years If all above answers are "NO", may proceed with cephalosporin use.      Medication List    STOP taking these medications   COUMADIN 7.5 MG tablet Generic drug:  warfarin     TAKE these medications   ACCU-CHEK AVIVA PLUS test strip Generic drug:  glucose blood USE AS DIRECTED TO CHECK BLOOD SUGAR QID   acetaminophen  500 MG tablet Commonly known as:  TYLENOL Take 1,000 mg by mouth every 6 (six) hours as needed.   amiodarone 200 MG tablet Commonly known as:  PACERONE Take 1 tablet (200 mg total) by mouth daily.   apixaban 5 MG Tabs tablet Commonly known as:  ELIQUIS Take 1 tablet (5 mg total) by mouth 2 (two) times daily.   atorvastatin 20 MG tablet Commonly known as:  LIPITOR TAKE 1 TABLET BY MOUTH AT NIGHT   BD PEN NEEDLE NANO U/F 32G X 4 MM Misc Generic drug:  Insulin Pen Needle Use as directed   calcium carbonate 600 MG Tabs tablet Commonly known as:  OS-CAL Take 600 mg by mouth daily with breakfast.   carvedilol 12.5 MG tablet Commonly known as:  COREG Take 1 tablet (12.5 mg total) by mouth 2 (two) times daily.   diclofenac sodium 1 % Gel Commonly known as:  VOLTAREN Apply 2 g topically 4 (four) times daily.   ferrous gluconate 324 MG tablet Commonly known as:  FERGON Take 325 mg by mouth daily with breakfast.   furosemide 80 MG tablet Commonly known as:  LASIX Take 1 tablet (80 mg total) by mouth 2 (two) times daily.   HUMALOG KWIKPEN 200 UNIT/ML Sopn Generic drug:  Insulin Lispro Inject 4-8 Units into the skin 3 (three) times daily. Per sliding scale   LEVEMIR FLEXTOUCH 100 UNIT/ML Pen Generic drug:  Insulin Detemir Inject 10-16 Units into the skin daily. 16 units in the am and 10 units in the evening   losartan 100 MG tablet Commonly known as:  COZAAR TAKE 1/2 TABLET(50 MG) BY MOUTH DAILY What changed:  See the new instructions.   magnesium oxide 400 MG tablet Commonly known as:  MAG-OX Take 400 mg by mouth daily.   omeprazole 40 MG capsule Commonly known as:  PRILOSEC TAKE ONE CAPSULE BY MOUTH DAILY BEFORE A MEAL What changed:  See the new instructions.   oxybutynin 5 MG tablet Commonly known as:  DITROPAN Take 1 tablet (5 mg total) by mouth 2 (two) times daily.   potassium chloride 10 MEQ CR capsule Commonly known as:  MICRO-K Take 4 tablets by mouth  X's 1 day then go back to 2 tablets by mouth daily What changed:    how much to take  how to take this  when to take this  additional instructions   pregabalin 75 MG capsule Commonly known as:  LYRICA Take 75 mg by mouth at bedtime.   VITAMIN B 12 PO Take 1 tablet daily by mouth.      Follow-up Information    [name], MD.   Specialty:  Internal Medicine Contact information: [address] [place] [address] [phone]          Allergies  Allergen Reactions  . Aspirin Itching  . Penicillins Itching and Rash    DID THE REACTION INVOLVE: Swelling  of the face/tongue/throat, SOB, or low BP? No Sudden or severe rash/hives, skin peeling, or the inside of the mouth or nose? Yes Did it require medical treatment? yes When did it last happen?more than 10 years If all above answers are "NO", may proceed with cephalosporin use.     Consultations:  None   Procedures/Studies: Ct Pelvis Wo Contrast  Result Date: 11/06/2018 CLINICAL DATA:  Right hip and groin pain which began when the patient attempted to get out of her wheelchair this morning. Initial encounter. EXAM: CT OF THE LOWER BILATERAL EXTREMITY WITHOUT CONTRAST TECHNIQUE: Multidetector CT imaging of the lower bilateral extremity was performed according to the standard protocol. COMPARISON:  None. FINDINGS: Bones/Joint/Cartilage The hips are located. No fracture is identified. Facet degenerative disease results in 0.6 cm anterolisthesis L4 on L5. There is disc bulging at both L4-5 and L5-S1. Foraminal narrowing is seen at both levels which appears worst bilaterally at L5-S1. Mild bilateral hip osteoarthritis is noted. Sacroiliac joints and symphysis pubis appear normal. Ligaments Suboptimally assessed by CT. Muscles and Tendons There is a small volume of hemorrhage and associated swelling in the right iliopsoas near the musculotendinous junction consistent with strain. No frank tear is  identified on CT scan. Soft tissues Imaged intrapelvic contents demonstrate moderate distention of the urinary bladder. The patient is status post hysterectomy. IMPRESSION: Findings consistent with strain of the right iliopsoas with some associated hemorrhage and swelling at the musculotendinous junction. Negative for fracture. Lower lumbar spondylosis. Electronically Signed   By: Inge Rise M.D.   On: 11/06/2018 18:17   Mr Brain Wo Contrast  Result Date: 11/06/2018 CLINICAL DATA:  Altered level of consciousness.  Possible stroke. EXAM: MRI HEAD WITHOUT CONTRAST TECHNIQUE: Multiplanar, multiecho pulse sequences of the brain and surrounding structures were obtained without intravenous contrast. COMPARISON:  11/17/2017 FINDINGS: Brain: Diffusion imaging does not show any acute or subacute infarction. Brainstem and cerebellum are normal. Cerebral hemispheres are normal except for very minimal small vessel change of the hemispheric deep white matter, less than often seen at this age. No cortical or large vessel territory infarction. No mass lesion, hemorrhage, hydrocephalus or extra-axial collection. Vascular: Major vessels at the base of the brain show flow. Skull and upper cervical spine: Negative Sinuses/Orbits: Clear/normal.  Previous lens implant on the right. Other: None IMPRESSION: Normal examination for age. No acute or reversible finding. Very minimal small vessel change of the hemispheric deep white matter, less than often seen at this age. Electronically Signed   By: Nelson Chimes M.D.   On: 11/06/2018 15:02   US Breast Ltd Uni Right Inc Axilla  Addendum Date: 10/30/2018   ADDENDUM REPORT: 10/30/2018 15:30 ADDENDUM: Please note, the original report inadvertently states the incorrect side in the recommendation section. The recommendation section should read as follows. Recommendation: Ultrasound-guided biopsy of the patient's palpable RIGHT breast mass. Electronically Signed   By: Kristopher Oppenheim  M.D.   On: 10/30/2018 15:30   Result Date: 10/30/2018 CLINICAL DATA:  85 year old female with a palpable right breast lump. EXAM: DIGITAL DIAGNOSTIC BILATERAL MAMMOGRAM WITH CAD AND TOMO ULTRASOUND RIGHT BREAST COMPARISON:  Previous exam(s). ACR Breast Density Category b: There are scattered areas of fibroglandular density. FINDINGS: Radiopaque BB at the site of the patient's palpable lump in the inferior central right breast is noted. A spiculated mass with surrounding focally dense asymmetry is identified at the radiopaque BB. There associated pleomorphic calcifications. The overall area of tissue density spans approximately 5.7 x 5.5 x 5.3 cm.  No suspicious findings are identified in the left. Mammographic images were processed with CAD. On physical exam, I palpate a firm 5-6 cm mass in the inferior right breast. Targeted ultrasound is performed, showing an irregular heterogenous mass with associated vascularity at the 5 o'clock position 2 cm from the nipple. Exact measurements are difficult due to size and vague appearance however, it measures at least 4.6 x 3.4 x 2.2 cm. This corresponds with the mammographic findings. Evaluation of the right axilla demonstrates no suspicious lymphadenopathy. IMPRESSION: 1. Suspicious right breast mass corresponding with the patient's palpable lump. Associated density on the mammogram spans at least 5.7 x 5.5 cm. 2. No suspicious right axillary lymphadenopathy. 3. No mammographic evidence of malignancy on the left. RECOMMENDATION: Ultrasound-guided biopsy of the patient's palpable left breast mass. I have discussed the findings and recommendations with the patient. Results were also provided in writing at the conclusion of the visit. If applicable, a reminder letter will be sent to the patient regarding the next appointment. BI-RADS CATEGORY  5: Highly suggestive of malignancy. Electronically Signed: By: Kristopher Oppenheim M.D. On: 10/30/2018 13:37   Mm Diag Breast Tomo  Bilateral  Addendum Date: 10/30/2018   ADDENDUM REPORT: 10/30/2018 15:30 ADDENDUM: Please note, the original report inadvertently states the incorrect side in the recommendation section. The recommendation section should read as follows. Recommendation: Ultrasound-guided biopsy of the patient's palpable RIGHT breast mass. Electronically Signed   By: Kristopher Oppenheim M.D.   On: 10/30/2018 15:30   Result Date: 10/30/2018 CLINICAL DATA:  85 year old female with a palpable right breast lump. EXAM: DIGITAL DIAGNOSTIC BILATERAL MAMMOGRAM WITH CAD AND TOMO ULTRASOUND RIGHT BREAST COMPARISON:  Previous exam(s). ACR Breast Density Category b: There are scattered areas of fibroglandular density. FINDINGS: Radiopaque BB at the site of the patient's palpable lump in the inferior central right breast is noted. A spiculated mass with surrounding focally dense asymmetry is identified at the radiopaque BB. There associated pleomorphic calcifications. The overall area of tissue density spans approximately 5.7 x 5.5 x 5.3 cm. No suspicious findings are identified in the left. Mammographic images were processed with CAD. On physical exam, I palpate a firm 5-6 cm mass in the inferior right breast. Targeted ultrasound is performed, showing an irregular heterogenous mass with associated vascularity at the 5 o'clock position 2 cm from the nipple. Exact measurements are difficult due to size and vague appearance however, it measures at least 4.6 x 3.4 x 2.2 cm. This corresponds with the mammographic findings. Evaluation of the right axilla demonstrates no suspicious lymphadenopathy. IMPRESSION: 1. Suspicious right breast mass corresponding with the patient's palpable lump. Associated density on the mammogram spans at least 5.7 x 5.5 cm. 2. No suspicious right axillary lymphadenopathy. 3. No mammographic evidence of malignancy on the left. RECOMMENDATION: Ultrasound-guided biopsy of the patient's palpable left breast mass. I have discussed  the findings and recommendations with the patient. Results were also provided in writing at the conclusion of the visit. If applicable, a reminder letter will be sent to the patient regarding the next appointment. BI-RADS CATEGORY  5: Highly suggestive of malignancy. Electronically Signed: By: Kristopher Oppenheim M.D. On: 10/30/2018 13:37   Dg Hip Unilat  With Pelvis 2-3 Views Right  Result Date: 11/06/2018 CLINICAL DATA:  Right hip and groin pain after attempting to get out of wheelchair this morning. EXAM: DG HIP (WITH OR WITHOUT PELVIS) 2-3V RIGHT COMPARISON:  None. FINDINGS: The bones are demineralized. No evidence of acute fracture, dislocation or femoral head  avascular necrosis. The hip joint spaces are relatively preserved. There are degenerative changes within the lower lumbar spine and sacroiliac joints. Bilateral pelvic calcifications are likely phleboliths. IMPRESSION: No acute osseous findings or significant hip arthropathy. If the patient has persistent hip pain or inability to bear weight, follow up imaging may be warranted as acute hip fractures can be radiographically occult in the elderly. Lumbar spondylosis. Electronically Signed   By: Richardean Sale M.D.   On: 11/06/2018 13:13       Subjective:   Discharge Exam: Vitals:   11/09/18 1002 11/09/18 1345  BP: 117/61 (!) 113/54  Pulse: (!) 56 (!) 55  Resp:  18  Temp:  98.2 F (36.8 C)  SpO2:  99%   Vitals:   11/08/18 2144 11/09/18 0513 11/09/18 1002 11/09/18 1345  BP: (!) 162/82 (!) 120/54 117/61 (!) 113/54  Pulse: 62 (!) 51 (!) 56 (!) 55  Resp: 20 16  18   Temp: 98.9 F (37.2 C) 98.4 F (36.9 C)  98.2 F (36.8 C)  TempSrc: Oral Oral  Oral  SpO2: 96% 100%  99%  Weight:  81.9 kg    Height:        General exam: Appears calm and comfortable  Respiratory system: Clear to auscultation. Respiratory effort normal. Cardiovascular system: S regular rate and rhythm, no murmurs Gastrointestinal system: Abdomen is nondistended,  soft and nontender. No organomegaly or masses felt. Normal bowel sounds heard. Central nervous system: Alert and oriented.  Grossly intact. Extremities: Bilateral lower extremity edema, right hip painful to abduction Skin: Over visible skin Psychiatry: Judgement and insight appear normal. Mood & affect appropriate.      The results of significant diagnostics from this hospitalization (including imaging, microbiology, ancillary and laboratory) are listed below for reference.     Microbiology: No results found for this or any previous visit (from the past 240 hour(s)).   Labs: BNP (last 3 results) No results for input(s): BNP in the last 8760 hours. Basic Metabolic Panel: Recent Labs  Lab 11/06/18 1356 11/07/18 0347  NA 144 143  K 3.7 3.4*  CL 106 108  CO2 29 24  GLUCOSE 134* 226*  BUN 30* 27*  CREATININE 1.44* 1.20*  CALCIUM 8.8* 8.1*   Liver Function Tests: Recent Labs  Lab 11/06/18 1356  AST 26  ALT 24  ALKPHOS 72  BILITOT 1.0  PROT 7.6  ALBUMIN 4.0   No results for input(s): LIPASE, AMYLASE in the last 168 hours. No results for input(s): AMMONIA in the last 168 hours. CBC: Recent Labs  Lab 11/06/18 1356 11/07/18 0347 11/08/18 0508  WBC 3.8* 3.0* 3.1*  NEUTROABS 1.8  --   --   HGB 10.7* 9.1* 9.9*  HCT 35.4* 30.2* 32.4*  MCV 96.5 99.0 98.2  PLT 94* 88* 87*   Cardiac Enzymes: No results for input(s): CKTOTAL, CKMB, CKMBINDEX, TROPONINI in the last 168 hours. BNP: Invalid input(s): POCBNP CBG: Recent Labs  Lab 11/08/18 1213 11/08/18 1712 11/08/18 2145 11/09/18 0724 11/09/18 1124  GLUCAP 255* 162* 117* 84 253*   D-Dimer No results for input(s): DDIMER in the last 72 hours. Hgb A1c No results for input(s): HGBA1C in the last 72 hours. Lipid Profile No results for input(s): CHOL, HDL, LDLCALC, TRIG, CHOLHDL, LDLDIRECT in the last 72 hours. Thyroid function studies No results for input(s): TSH, T4TOTAL, T3FREE, THYROIDAB in the last 72  hours.  Invalid input(s): FREET3 Anemia work up No results for input(s): VITAMINB12, FOLATE, FERRITIN, TIBC, IRON, RETICCTPCT in  the last 72 hours. Urinalysis    Component Value Date/Time   COLORURINE YELLOW 11/06/2018 2027   APPEARANCEUR CLEAR 11/06/2018 2027   LABSPEC 1.013 11/06/2018 2027   PHURINE 6.0 11/06/2018 2027   GLUCOSEU 150 (A) 11/06/2018 2027   HGBUR NEGATIVE 11/06/2018 2027   BILIRUBINUR NEGATIVE 11/06/2018 2027   KETONESUR NEGATIVE 11/06/2018 2027   PROTEINUR NEGATIVE 11/06/2018 2027   UROBILINOGEN 0.2 10/12/2012 2037   NITRITE NEGATIVE 11/06/2018 2027   LEUKOCYTESUR MODERATE (A) 11/06/2018 2027   Sepsis Labs Invalid input(s): PROCALCITONIN,  WBC,  LACTICIDVEN Microbiology No results found for this or any previous visit (from the past 240 hour(s)).   Time coordinating discharge: 35  SIGNED:   Cristy Folks, MD  [facility] 11/09/2018, 1:55 PM  If 7PM-7AM, please contact night-coverage [email or web address] Password [email or web address]

## 2018-11-09 NOTE — Progress Notes (Signed)
Spiritual Care Note  While covering [facility], received page for assistance with Advance Directives. Reviewed full packet with [name]. Also described completion process, which requires notary and two witnesses. [name] wanted to digest the AD information and to discuss with her family before completing the forms and knows to request notary assistance here (or elsewhere, if she needs more time) when she is ready.    [place], [place], [facility] M-F daytime pager [phone] [facility] 24/7 pager [phone] Voicemail [phone]

## 2018-11-09 NOTE — Progress Notes (Signed)
PROGRESS NOTE    DESTYNEE [name]  NFA:[id number] DOB: 12-Apr-1933 DOA: 11/06/2018 PCP: [name], MD    Brief Narrative:  85 year old with past medical history relevant for paroxysmal atrial fibrillation on warfarin, history of bilateral lower extremity DVT, chronic diastolic heart failure, stage III CKD, hypoproliferative anemia, type 2 diabetes, hypertension, chronic pain on insulin admitted with inability to walk due to right hip pain and found to have hip strain as well as some intramuscular bleeding.   Assessment & Plan:   Principal Problem:   Hematoma of right iliopsoas muscle Active Problems:   Chronic diastolic heart failure (HCC)   CKD (chronic kidney disease), stage III (HCC)   HTN (hypertension)   DVT (deep venous thrombosis) (HCC)   DM (diabetes mellitus) ([place])   Warfarin-induced coagulopathy (HCC)   Intramuscular hematoma   #) Right hip strain/hematoma: Patient is unable to ambulate -Hold warfarin, will consider discharging on NOAC, apixaban is covered - PT consult recommends skilled nursing facility -Social work consult for placement, pending authorization  #) History of DVT/paroxysmal atrial fibrillation/h/o CVA: -Continue beta-blocker - Continue amiodarone 200 mg daily -Holding anticoagulation is apparent patient is therapeutic on INR, will transition to apixaban on discharge  #) Stage III CKD/hypoproliferative anemia: -Stable -Continue iron supplementation  #) Type 2 diabetes: -Sliding scale insulin, AC at bedtime -Continue Levemir 16 units every morning and 10 units nightly  #) Hypertension/hyperlipidemia: -Continue losartan 50 mg daily - Continue carvedilol 12.5 mg twice daily -Continue with statin 20 mg nightly  #) Chronic diastolic heart failure: -Continue furosemide 80 mg twice daily  #) Breast mass: Patient had a breast biopsy scheduled for 11/06/2018 but was admitted to the ER for her hip strain. -Outpatient follow-up for this  Fluids:  Tolerating p.o. Electrolytes: Monitor and supplement Nutrition: Carb restricted heart healthy diet   Prophylaxis: elevated INR  Disposition: Skilled nursing facility  Full code   Consultants:   none  Procedures:   none  Antimicrobials:   none    Subjective: Patient reports she is doing much better other than some soreness in her hip.  She denies any nausea, vomiting, diarrhea, cough, congestion, diarrhea.  Objective: Vitals:   11/08/18 1458 11/08/18 2139 11/08/18 2144 11/09/18 0513  BP: (!) 103/55 (!) 162/82 (!) 162/82 (!) 120/54  Pulse: 61 96 62 (!) 51  Resp: 18  20 16   Temp: 98.7 F (37.1 C)  98.9 F (37.2 C) 98.4 F (36.9 C)  TempSrc: Oral  Oral Oral  SpO2: 95%  96% 100%  Weight:    81.9 kg  Height:        Intake/Output Summary (Last 24 hours) at 11/09/2018 1000 Last data filed at 11/09/2018 4696 Gross per 24 hour  Intake 600 ml  Output 1380 ml  Net -780 ml   Filed Weights   11/06/18 2226 11/08/18 0600 11/09/18 0513  Weight: 82.6 kg 81.3 kg 81.9 kg    Examination:  General exam: Appears calm and comfortable  Respiratory system: Clear to auscultation. Respiratory effort normal. Cardiovascular system: S regular rate and rhythm, no murmurs Gastrointestinal system: Abdomen is nondistended, soft and nontender. No organomegaly or masses felt. Normal bowel sounds heard. Central nervous system: Alert and oriented.  Grossly intact. Extremities: Bilateral lower extremity edema, right hip painful to abduction Skin: Over visible skin Psychiatry: Judgement and insight appear normal. Mood & affect appropriate.     Data Reviewed: I have personally reviewed following labs and imaging studies  CBC: Recent Labs  Lab 11/06/18 1356  11/07/18 0347 11/08/18 0508  WBC 3.8* 3.0* 3.1*  NEUTROABS 1.8  --   --   HGB 10.7* 9.1* 9.9*  HCT 35.4* 30.2* 32.4*  MCV 96.5 99.0 98.2  PLT 94* 88* 87*   Basic Metabolic Panel: Recent Labs  Lab 11/06/18 1356  11/07/18 0347  NA 144 143  K 3.7 3.4*  CL 106 108  CO2 29 24  GLUCOSE 134* 226*  BUN 30* 27*  CREATININE 1.44* 1.20*  CALCIUM 8.8* 8.1*   GFR: Estimated Creatinine Clearance: 37.6 mL/min (A) (by C-G formula based on SCr of 1.2 mg/dL (H)). Liver Function Tests: Recent Labs  Lab 11/06/18 1356  AST 26  ALT 24  ALKPHOS 72  BILITOT 1.0  PROT 7.6  ALBUMIN 4.0   No results for input(s): LIPASE, AMYLASE in the last 168 hours. No results for input(s): AMMONIA in the last 168 hours. Coagulation Profile: Recent Labs  Lab 11/06/18 1356 11/07/18 0347 11/09/18 0522  INR 2.38 2.59 2.12   Cardiac Enzymes: No results for input(s): CKTOTAL, CKMB, CKMBINDEX, TROPONINI in the last 168 hours. BNP (last 3 results) Recent Labs    04/04/18 1025 07/06/18 1118 08/06/18 1244  PROBNP 1,189* 1,500* 1,604*   HbA1C: No results for input(s): HGBA1C in the last 72 hours. CBG: Recent Labs  Lab 11/08/18 0742 11/08/18 1213 11/08/18 1712 11/08/18 2145 11/09/18 0724  GLUCAP 71 255* 162* 117* 84   Lipid Profile: No results for input(s): CHOL, HDL, LDLCALC, TRIG, CHOLHDL, LDLDIRECT in the last 72 hours. Thyroid Function Tests: No results for input(s): TSH, T4TOTAL, FREET4, T3FREE, THYROIDAB in the last 72 hours. Anemia Panel: No results for input(s): VITAMINB12, FOLATE, FERRITIN, TIBC, IRON, RETICCTPCT in the last 72 hours. Sepsis Labs: No results for input(s): PROCALCITON, LATICACIDVEN in the last 168 hours.  No results found for this or any previous visit (from the past 240 hour(s)).       Radiology Studies: No results found.      Scheduled Meds: . amiodarone  200 mg Oral Daily  . atorvastatin  20 mg Oral QHS  . carvedilol  12.5 mg Oral BID  . ferrous gluconate  325 mg Oral Q breakfast  . furosemide  80 mg Oral BID  . insulin aspart  0-9 Units Subcutaneous TID WC  . insulin detemir  10 Units Subcutaneous QHS  . insulin detemir  16 Units Subcutaneous Daily  . losartan   50 mg Oral Daily  . magnesium oxide  400 mg Oral Daily  . oxybutynin  5 mg Oral BID  . pantoprazole  40 mg Oral Daily  . potassium chloride  20 mEq Oral BID  . pregabalin  75 mg Oral QHS   Continuous Infusions:   LOS: 0 days    Time spent: [place], MD [facility]  If 7PM-7AM, please contact night-coverage [email or web address] Password [email or web address] 11/09/2018, 10:00 AM

## 2018-11-09 NOTE — Discharge Instructions (Signed)
Apixaban oral tablets What is this medicine? APIXABAN (a PIX a ban) is an anticoagulant (blood thinner). It is used to lower the chance of stroke in people with a medical condition called atrial fibrillation. It is also used to treat or prevent blood clots in the lungs or in the veins. This medicine may be used for other purposes; ask your health care provider or pharmacist if you have questions. COMMON BRAND NAME(S): Eliquis What should I tell my health care provider before I take this medicine? They need to know if you have any of these conditions: -bleeding disorders -bleeding in the brain -blood in your stools (black or tarry stools) or if you have blood in your vomit -history of stomach bleeding -kidney disease -liver disease -mechanical heart valve -an unusual or allergic reaction to apixaban, other medicines, foods, dyes, or preservatives -pregnant or trying to get pregnant -breast-feeding How should I use this medicine? Take this medicine by mouth with a glass of water. Follow the directions on the prescription label. You can take it with or without food. If it upsets your stomach, take it with food. Take your medicine at regular intervals. Do not take it more often than directed. Do not stop taking except on your doctor's advice. Stopping this medicine may increase your risk of a blood clot. Be sure to refill your prescription before you run out of medicine. Talk to your pediatrician regarding the use of this medicine in children. Special care may be needed. Overdosage: If you think you have taken too much of this medicine contact a poison control center or emergency room at once. NOTE: This medicine is only for you. Do not share this medicine with others. What if I miss a dose? If you miss a dose, take it as soon as you can. If it is almost time for your next dose, take only that dose. Do not take double or extra doses. What may interact with this medicine? This medicine may  interact with the following: -aspirin and aspirin-like medicines -certain medicines for fungal infections like ketoconazole and itraconazole -certain medicines for seizures like carbamazepine and phenytoin -certain medicines that treat or prevent blood clots like warfarin, enoxaparin, and dalteparin -clarithromycin -NSAIDs, medicines for pain and inflammation, like ibuprofen or naproxen -rifampin -ritonavir -St. John's wort This list may not describe all possible interactions. Give your health care provider a list of all the medicines, herbs, non-prescription drugs, or dietary supplements you use. Also tell them if you smoke, drink alcohol, or use illegal drugs. Some items may interact with your medicine. What should I watch for while using this medicine? Visit your healthcare professional for regular checks on your progress. You may need blood work done while you are taking this medicine. Your condition will be monitored carefully while you are receiving this medicine. It is important not to miss any appointments. Avoid sports and activities that might cause injury while you are using this medicine. Severe falls or injuries can cause unseen bleeding. Be careful when using sharp tools or knives. Consider using an Copy. Take special care brushing or flossing your teeth. Report any injuries, bruising, or red spots on the skin to your healthcare professional. If you are going to need surgery or other procedure, tell your healthcare professional that you are taking this medicine. Wear a medical ID bracelet or chain. Carry a card that describes your disease and details of your medicine and dosage times. What side effects may I notice from receiving this medicine? Side  effects that you should report to your doctor or health care professional as soon as possible: -allergic reactions like skin rash, itching or hives, swelling of the face, lips, or tongue -signs and symptoms of bleeding such as  bloody or black, tarry stools; red or dark-brown urine; spitting up blood or brown material that looks like coffee grounds; red spots on the skin; unusual bruising or bleeding from the eye, gums, or nose -signs and symptoms of a blood clot such as chest pain; shortness of breath; pain, swelling, or warmth in the leg -signs and symptoms of a stroke such as changes in vision; confusion; trouble speaking or understanding; severe headaches; sudden numbness or weakness of the face, arm or leg; trouble walking; dizziness; loss of coordination This list may not describe all possible side effects. Call your doctor for medical advice about side effects. You may report side effects to FDA at 1-800-FDA-1088. Where should I keep my medicine? Keep out of the reach of children. Store at room temperature between 20 and 25 degrees C (68 and 77 degrees F). Throw away any unused medicine after the expiration date. NOTE: This sheet is a summary. It may not cover all possible information. If you have questions about this medicine, talk to your doctor, pharmacist, or health care provider.  2019 Elsevier/Gold Standard (2017-10-05 11:20:07)

## 2018-11-09 NOTE — Progress Notes (Signed)
Patient's heart rate in the 50 Coreg/Amiodarone and losartan due, notified Dr. Herbert Moors notified and stated to hold coreg and give the rest.

## 2018-11-09 NOTE — Progress Notes (Signed)
Clinical Social Worker facilitated patient discharge including contacting patient family and facility to confirm patient discharge plans.  Clinical information faxed to facility and family agreeable with plan.  CSW arranged ambulance transport via [facility] to [facility].  RN to call [phone] (pt will go in rm# [phone] P) for report prior to discharge.  Clinical Social Worker will sign off for now as social work intervention is no longer needed. Please consult [place] again if new need arises.  Rhea Pink, MSW, [place]

## 2018-11-09 NOTE — Progress Notes (Signed)
Clinical Social Worker following patient for support and discharge needs. Patient has chosen to go to [facility] for short term rehab. Facility started authorization through insurance. CSW awaiting insurance authorization from [facility].   Rhea Pink, MSW,  [place]

## 2018-11-09 NOTE — Progress Notes (Addendum)
Physical Therapy Treatment Patient Details Name: [name] MRN: [id number] DOB: 10-May-1933 Today's Date: 11/09/2018    History of Present Illness 85 yo female admitted with R iliopsoas muscle hematoma and strain. Hx of CVA, A fib, DVT, obesity, CHF, CKD, chronic anemia, DM, LBBB    PT Comments    Patient seen for mobility progression - good motivation to participate. Min A for all transfers and gait with RW. Assist needed to power up from seated position, initial standing balance as well as mobility with RW. Gait limited by pain at R LE. Will continue to recommend SNF at discharge to progress safe functional mobility. PT to continue to follow.     Follow Up Recommendations  SNF     Equipment Recommendations  None recommended by PT    Recommendations for Other Services       Precautions / Restrictions Precautions Precautions: Fall Restrictions Weight Bearing Restrictions: No    Mobility  Bed Mobility               General bed mobility comments: up in recliner  Transfers Overall transfer level: Needs assistance Equipment used: Rolling walker (2 wheeled) Transfers: Sit to/from Stand Sit to Stand: Min assist         General transfer comment: Min A from recliner; cueing for hand placement and sequencing  Ambulation/Gait Ambulation/Gait assistance: Min assist Gait Distance (Feet): 50 Feet(2 reps with 1 seated rest break) Assistive device: Rolling walker (2 wheeled) Gait Pattern/deviations: Step-to pattern;Step-through pattern;Decreased stride length;Decreased weight shift to right Gait velocity: decreased   General Gait Details: patient with reports of pain with R LE weight bearing - reduced weight shift to this LE; Min A for RW management   Stairs             Wheelchair Mobility    Modified Rankin (Stroke Patients Only)       Balance Overall balance assessment: Needs assistance;History of Falls Sitting-balance support: No upper extremity  supported;Feet supported Sitting balance-Leahy Scale: Fair     Standing balance support: Bilateral upper extremity supported;During functional activity Standing balance-Leahy Scale: Poor Standing balance comment: reliant on UE support                            Cognition Arousal/Alertness: Awake/alert Behavior During Therapy: WFL for tasks assessed/performed Overall Cognitive Status: Within Functional Limits for tasks assessed                                        Exercises      General Comments General comments (skin integrity, edema, etc.): patient motivated to participate      Pertinent Vitals/Pain Pain Assessment: 0-10 Pain Score: 5  Pain Location: R LE with weight bearing Pain Descriptors / Indicators: Aching;Discomfort;Grimacing;Guarding Pain Intervention(s): Limited activity within patient's tolerance;Monitored during session;Repositioned    Home Living                      Prior Function            PT Goals (current goals can now be found in the care plan section) Acute Rehab PT Goals Patient Stated Goal: less pain. to d/c home PT Goal Formulation: With patient Time For Goal Achievement: 11/21/18 Potential to Achieve Goals: Good Progress towards PT goals: Progressing toward goals    Frequency  Min 3X/week      PT Plan Current plan remains appropriate    Co-evaluation              AM-PAC PT "6 Clicks" Mobility   Outcome Measure  Help needed turning from your back to your side while in a flat bed without using bedrails?: A Little Help needed moving from lying on your back to sitting on the side of a flat bed without using bedrails?: A Little Help needed moving to and from a bed to a chair (including a wheelchair)?: A Little Help needed standing up from a chair using your arms (e.g., wheelchair or bedside chair)?: A Little Help needed to walk in hospital room?: A Little Help needed climbing 3-5 steps  with a railing? : A Lot 6 Click Score: 17    End of Session Equipment Utilized During Treatment: Gait belt Activity Tolerance: Patient tolerated treatment well Patient left: in chair;with call bell/phone within reach;with chair alarm set Nurse Communication: Mobility status PT Visit Diagnosis: Difficulty in walking, not elsewhere classified (R26.2);Muscle weakness (generalized) (M62.81);Pain;Unsteadiness on feet (R26.81);History of falling (Z91.81) Pain - Right/Left: Right Pain - part of body: Leg     Time: 1655-3748 PT Time Calculation (min) (ACUTE ONLY): 25 min  Charges:  $Gait Training: 8-22 mins $Therapeutic Activity: 8-22 mins                      Lanney Gins, PT, DPT Supplemental Physical Therapist 11/09/18 2:21 PM Pager: [phone] Office: [phone]

## 2018-11-09 NOTE — Clinical Social Work Placement (Signed)
   CLINICAL SOCIAL WORK PLACEMENT  NOTE  Date:  11/09/2018  Patient Details  Name: [name] MRN: [id number] Date of Birth: 08-30-1933  Clinical Social Work is seeking post-discharge placement for this patient at the [place] level of care (*CSW will initial, date and re-position this form in  chart as items are completed):  Yes   Patient/family provided with [facility] Work Department's list of facilities offering this level of care within the geographic area requested by the patient (or if unable, by the patient's family).  Yes   Patient/family informed of their freedom to choose among providers that offer the needed level of care, that participate in Medicare, Medicaid or managed care program needed by the patient, have an available bed and are willing to accept the patient.  Yes   Patient/family informed of [facility]'s ownership interest in [facility] and [facility], as well as of the fact that they are under no obligation to receive care at these facilities.  PASRR submitted to EDS on       PASRR number received on       Existing PASRR number confirmed on 11/09/18     FL2 transmitted to all facilities in geographic area requested by pt/family on 11/09/18     FL2 transmitted to all facilities within larger geographic area on       Patient informed that his/her managed care company has contracts with or will negotiate with certain facilities, including the following:            Patient/family informed of bed offers received.  Patient chooses bed at [facility]     Physician recommends and patient chooses bed at      Patient to be transferred to [facility] on 11/09/18.  Patient to be transferred to facility by [facility]     Patient family notified on 11/09/18 of transfer.  Name of family member notified:  granddaughter     PHYSICIAN       Additional Comment:    _______________________________________________ Wende Neighbors, LCSW 11/09/2018, 2:07 PM

## 2018-11-12 ENCOUNTER — Other Ambulatory Visit: Payer: Self-pay | Admitting: *Deleted

## 2018-11-12 DIAGNOSIS — I5032 Chronic diastolic (congestive) heart failure: Secondary | ICD-10-CM

## 2018-11-12 DIAGNOSIS — S76011D Strain of muscle, fascia and tendon of right hip, subsequent encounter: Secondary | ICD-10-CM | POA: Diagnosis not present

## 2018-11-12 DIAGNOSIS — M7981 Nontraumatic hematoma of soft tissue: Secondary | ICD-10-CM | POA: Diagnosis not present

## 2018-11-12 DIAGNOSIS — I1310 Hypertensive heart and chronic kidney disease without heart failure, with stage 1 through stage 4 chronic kidney disease, or unspecified chronic kidney disease: Secondary | ICD-10-CM | POA: Diagnosis not present

## 2018-11-12 DIAGNOSIS — I480 Paroxysmal atrial fibrillation: Secondary | ICD-10-CM | POA: Diagnosis not present

## 2018-11-12 NOTE — Patient Outreach (Signed)
[facility]) Care Management  11/12/2018  [name] [birth year] [id number]   RN Health Coach Discipline Closure  Referral Date:11/22/2016 Referral Source:[facility] Screening Reason for Referral:Disease Management/Education Insurance:[employer] Medicare   Outreach Attempt:  Patient discharged from [facility] on 11/09/2018 to [facility].  Plan:  RN Health Coach will close Disease Management Case at this time.  Per notes Hospital Liaison to place [place] Work referral.  [place] will send primary care provider Discipline Closure Letter.  [place] [phone] [email or web address]

## 2018-11-12 NOTE — Consult Note (Signed)
[facility] Care Management hospital liaison follow up.  Chart reviewed. Noted Mrs. Jarrard discharged to [facility] SNF on 11/09/18. She was active with [facility] prior to hospitalization. Therefore, Probation officer will make referral to [facility] LCSW for follow up while at [facility].   Marthenia Rolling, MSN-Ed, RN,BSN [facility] Liaison [phone]

## 2018-11-13 DIAGNOSIS — I5032 Chronic diastolic (congestive) heart failure: Secondary | ICD-10-CM | POA: Diagnosis not present

## 2018-11-13 DIAGNOSIS — S76011D Strain of muscle, fascia and tendon of right hip, subsequent encounter: Secondary | ICD-10-CM | POA: Diagnosis not present

## 2018-11-13 DIAGNOSIS — E119 Type 2 diabetes mellitus without complications: Secondary | ICD-10-CM | POA: Diagnosis not present

## 2018-11-13 DIAGNOSIS — I480 Paroxysmal atrial fibrillation: Secondary | ICD-10-CM | POA: Diagnosis not present

## 2018-11-14 ENCOUNTER — Encounter: Payer: Self-pay | Admitting: *Deleted

## 2018-11-14 ENCOUNTER — Other Ambulatory Visit: Payer: Self-pay | Admitting: *Deleted

## 2018-11-14 NOTE — Patient Outreach (Signed)
[facility]) Care Management  11/14/2018  [name] 1933-08-16 [id number]   CSW was able to make contact with patient and patient's daughter, [name] today to perform the initial assessment, as well as assess and assist with social work needs and services.  CSW met with patient and [name] at [facility] where patient currently resides to receive short-term rehabilitative services.  CSW introduced self, explained role and types of services provided through [facility] ([facility]).  CSW further explained to patient and [name] that CSW works with Marthenia Rolling, [facility], also with [facility].  CSW then explained the reason for the visit, indicating that Ms. [place] Crane thought that patient and [name] would benefit from social work services and resources to assist with discharge planning needs and services from the skilled nursing facility.  CSW obtained two HIPAA compliant identifiers from patient, which included patient's name and date of birth.  [name] reported that she just came from the [place] where she had to update patient's MQB. MQB is a limited Medicaid program for Medicare beneficiaries and is limited to payment of Medicare premiums, deductibles and coinsurance for charges covered by Medicare.  [name] went on to say that patient will not be returning home to live alone at time of discharge from [facility], that she will be moving patient in to live with her and her family, providing 24 hour care and supervision.  [name] is aware that patient is prone to frequent falls and may require assistance with activities of daily living moving forward.  Patient voiced understanding that she will be returning home to live with [name], agreeing that she is no longer capable of living alone, preparing her own  meals, performing light house-keeping duties, etc.  Patient and [name] are agreeable to home health services being arranged for patient at time of discharge.  CSW will follow-up with patient again next week (Thursday, November 22, 2018 at 10:00AM) to assess and assist with discharge planning needs and services.  [name], BSW, MSW, LCSW  Licensed Education officer, [facility]  Mailing [place] [address], [place], [place] [address] Physical Address-[address], [place], [place] [address] Toll Free Main # [phone] Fax # [phone] Cell # [phone]  Office # [phone] [email or web address]

## 2018-11-15 ENCOUNTER — Other Ambulatory Visit: Payer: Self-pay

## 2018-11-15 NOTE — Patient Outreach (Signed)
[facility]) Care Management  11/15/2018  [name] Jul 07, 1933 [id number]   Medication Adherence call to [name]  patient did not answer but [employer] said patient last pick up was on 11/14/18 for a 90 days supply. [name] is showing past due under [place].   [place] Management Direct Dial [phone]  Fax [phone] [name]

## 2018-11-19 ENCOUNTER — Ambulatory Visit
Admission: RE | Admit: 2018-11-19 | Discharge: 2018-11-19 | Disposition: A | Discharge status: Home / Self Care | Payer: Medicare Other | Source: Ambulatory Visit | Attending: Internal Medicine | Admitting: Internal Medicine

## 2018-11-19 ENCOUNTER — Ambulatory Visit: Payer: Medicare Other | Admitting: Internal Medicine

## 2018-11-19 DIAGNOSIS — N6314 Unspecified lump in the right breast, lower inner quadrant: Secondary | ICD-10-CM | POA: Diagnosis not present

## 2018-11-19 DIAGNOSIS — I480 Paroxysmal atrial fibrillation: Secondary | ICD-10-CM | POA: Diagnosis not present

## 2018-11-19 DIAGNOSIS — N6310 Unspecified lump in the right breast, unspecified quadrant: Secondary | ICD-10-CM

## 2018-11-19 DIAGNOSIS — S76011D Strain of muscle, fascia and tendon of right hip, subsequent encounter: Secondary | ICD-10-CM | POA: Diagnosis not present

## 2018-11-19 DIAGNOSIS — E119 Type 2 diabetes mellitus without complications: Secondary | ICD-10-CM | POA: Diagnosis not present

## 2018-11-19 DIAGNOSIS — M7981 Nontraumatic hematoma of soft tissue: Secondary | ICD-10-CM | POA: Diagnosis not present

## 2018-11-19 HISTORY — PX: BREAST BIOPSY: SHX20

## 2018-11-21 ENCOUNTER — Other Ambulatory Visit: Payer: Medicare Other

## 2018-11-22 ENCOUNTER — Telehealth: Payer: Self-pay | Admitting: Hematology and Oncology

## 2018-11-22 ENCOUNTER — Other Ambulatory Visit: Payer: Self-pay | Admitting: *Deleted

## 2018-11-22 DIAGNOSIS — I480 Paroxysmal atrial fibrillation: Secondary | ICD-10-CM | POA: Diagnosis not present

## 2018-11-22 DIAGNOSIS — E119 Type 2 diabetes mellitus without complications: Secondary | ICD-10-CM | POA: Diagnosis not present

## 2018-11-22 DIAGNOSIS — M160 Bilateral primary osteoarthritis of hip: Secondary | ICD-10-CM | POA: Diagnosis not present

## 2018-11-22 DIAGNOSIS — S76011D Strain of muscle, fascia and tendon of right hip, subsequent encounter: Secondary | ICD-10-CM | POA: Diagnosis not present

## 2018-11-22 NOTE — Telephone Encounter (Signed)
Spoke with patient and patients daughter for upcoming afternoon [facility] appointment for 2/5, packet will be mailed to the daughter

## 2018-11-22 NOTE — Patient Outreach (Signed)
[facility]) Care Management  11/22/2018  [name] 12-04-1932 [id number]   CSW was able to meet with patient at [place], [place] where patient currently resides to receive short-term rehabilitative services, today to perform a routine visit.  Patient appeared to be in good spirits today, but admitted to experiencing a great deal of pain in her right hip.  Patient indicated that she continues to ambulate with a rolling walker, able to walk about 50 feet before needing to rest.  Patient is fearful of falling, due to a significant history of falls; therefore, she reports always using her rolling walker when trying to ambulate, even short distances.  In talking with patient's physical therapist at the facility, CSW was told that patient is always very motivated to participate with therapies, sometimes even past the point of pain tolerance.  Patient's physical therapist always indicated that patient is progressing well toward goals, hoping to be discharged home with home health services within the next few weeks.  Patient was anxious to get to the "Activities Room", wanting to participate in "Senior Games".  CSW will plan to meet with patient again on Thursday, November 29, 2018 at 9:00AM to assess and assist with discharge planning needs and services.  [name], BSW, MSW, LCSW  Licensed Education officer, [facility]  Mailing [place] [address], [place], [place] [address] Physical Address-[address], [place],  [address] Toll Free Main # [phone] Fax # [phone] Cell # [phone]  Office # [phone] [email or web address]

## 2018-11-23 ENCOUNTER — Encounter: Payer: Self-pay | Admitting: *Deleted

## 2018-11-23 DIAGNOSIS — Z170 Estrogen receptor positive status [ER+]: Secondary | ICD-10-CM | POA: Insufficient documentation

## 2018-11-23 DIAGNOSIS — C50311 Malignant neoplasm of lower-inner quadrant of right female breast: Secondary | ICD-10-CM | POA: Insufficient documentation

## 2018-11-26 DIAGNOSIS — S76011D Strain of muscle, fascia and tendon of right hip, subsequent encounter: Secondary | ICD-10-CM | POA: Diagnosis not present

## 2018-11-26 DIAGNOSIS — E119 Type 2 diabetes mellitus without complications: Secondary | ICD-10-CM | POA: Diagnosis not present

## 2018-11-26 DIAGNOSIS — I480 Paroxysmal atrial fibrillation: Secondary | ICD-10-CM | POA: Diagnosis not present

## 2018-11-26 DIAGNOSIS — M160 Bilateral primary osteoarthritis of hip: Secondary | ICD-10-CM | POA: Diagnosis not present

## 2018-11-27 DIAGNOSIS — M7981 Nontraumatic hematoma of soft tissue: Secondary | ICD-10-CM | POA: Diagnosis not present

## 2018-11-27 DIAGNOSIS — S76011D Strain of muscle, fascia and tendon of right hip, subsequent encounter: Secondary | ICD-10-CM | POA: Diagnosis not present

## 2018-11-27 DIAGNOSIS — E119 Type 2 diabetes mellitus without complications: Secondary | ICD-10-CM | POA: Diagnosis not present

## 2018-11-27 DIAGNOSIS — I480 Paroxysmal atrial fibrillation: Secondary | ICD-10-CM | POA: Diagnosis not present

## 2018-11-28 ENCOUNTER — Ambulatory Visit
Admission: RE | Admit: 2018-11-28 | Discharge: 2018-11-28 | Disposition: A | Discharge status: Home / Self Care | Payer: Medicare Other | Source: Ambulatory Visit | Attending: Radiation Oncology | Admitting: Radiation Oncology

## 2018-11-28 ENCOUNTER — Other Ambulatory Visit: Payer: Self-pay

## 2018-11-28 ENCOUNTER — Ambulatory Visit: Payer: Medicare Other | Attending: General Surgery | Admitting: Physical Therapy

## 2018-11-28 ENCOUNTER — Encounter: Payer: Self-pay | Admitting: Hematology and Oncology

## 2018-11-28 ENCOUNTER — Inpatient Hospital Stay: Payer: Medicare Other | Attending: Hematology and Oncology | Admitting: Hematology and Oncology

## 2018-11-28 ENCOUNTER — Encounter: Payer: Self-pay | Admitting: Physical Therapy

## 2018-11-28 ENCOUNTER — Inpatient Hospital Stay: Payer: Medicare Other

## 2018-11-28 DIAGNOSIS — R293 Abnormal posture: Secondary | ICD-10-CM | POA: Diagnosis not present

## 2018-11-28 DIAGNOSIS — I509 Heart failure, unspecified: Secondary | ICD-10-CM | POA: Insufficient documentation

## 2018-11-28 DIAGNOSIS — Z170 Estrogen receptor positive status [ER+]: Secondary | ICD-10-CM

## 2018-11-28 DIAGNOSIS — I4891 Unspecified atrial fibrillation: Secondary | ICD-10-CM | POA: Diagnosis not present

## 2018-11-28 DIAGNOSIS — C50311 Malignant neoplasm of lower-inner quadrant of right female breast: Secondary | ICD-10-CM | POA: Insufficient documentation

## 2018-11-28 DIAGNOSIS — N189 Chronic kidney disease, unspecified: Secondary | ICD-10-CM | POA: Insufficient documentation

## 2018-11-28 DIAGNOSIS — E119 Type 2 diabetes mellitus without complications: Secondary | ICD-10-CM

## 2018-11-28 DIAGNOSIS — Z803 Family history of malignant neoplasm of breast: Secondary | ICD-10-CM | POA: Insufficient documentation

## 2018-11-28 DIAGNOSIS — C50911 Malignant neoplasm of unspecified site of right female breast: Secondary | ICD-10-CM | POA: Diagnosis not present

## 2018-11-28 DIAGNOSIS — M25611 Stiffness of right shoulder, not elsewhere classified: Secondary | ICD-10-CM | POA: Diagnosis not present

## 2018-11-28 LAB — CBC WITH DIFFERENTIAL (CANCER CENTER ONLY)
Abs Immature Granulocytes: 0 10*3/uL (ref 0.00–0.07)
BASOS ABS: 0 10*3/uL (ref 0.0–0.1)
Basophils Relative: 1 %
EOS PCT: 4 %
Eosinophils Absolute: 0.1 10*3/uL (ref 0.0–0.5)
HCT: 33.5 % — ABNORMAL LOW (ref 36.0–46.0)
Hemoglobin: 10.8 g/dL — ABNORMAL LOW (ref 12.0–15.0)
Immature Granulocytes: 0 %
Lymphocytes Relative: 30 %
Lymphs Abs: 1 10*3/uL (ref 0.7–4.0)
MCH: 30.2 pg (ref 26.0–34.0)
MCHC: 32.2 g/dL (ref 30.0–36.0)
MCV: 93.6 fL (ref 80.0–100.0)
Monocytes Absolute: 0.4 10*3/uL (ref 0.1–1.0)
Monocytes Relative: 10 %
NRBC: 0 % (ref 0.0–0.2)
Neutro Abs: 1.9 10*3/uL (ref 1.7–7.7)
Neutrophils Relative %: 55 %
Platelet Count: 110 10*3/uL — ABNORMAL LOW (ref 150–400)
RBC: 3.58 MIL/uL — ABNORMAL LOW (ref 3.87–5.11)
RDW: 14.3 % (ref 11.5–15.5)
WBC Count: 3.5 10*3/uL — ABNORMAL LOW (ref 4.0–10.5)

## 2018-11-28 LAB — CMP (CANCER CENTER ONLY)
ALT: 29 U/L (ref 0–44)
AST: 24 U/L (ref 15–41)
Albumin: 3.7 g/dL (ref 3.5–5.0)
Alkaline Phosphatase: 95 U/L (ref 38–126)
Anion gap: 12 (ref 5–15)
BUN: 50 mg/dL — ABNORMAL HIGH (ref 8–23)
CO2: 33 mmol/L — ABNORMAL HIGH (ref 22–32)
Calcium: 9.4 mg/dL (ref 8.9–10.3)
Chloride: 98 mmol/L (ref 98–111)
Creatinine: 2.35 mg/dL — ABNORMAL HIGH (ref 0.44–1.00)
GFR, Est AFR Am: 21 mL/min — ABNORMAL LOW (ref >60)
GFR, Estimated: 19 mL/min — ABNORMAL LOW (ref >60)
Glucose, Bld: 229 mg/dL — ABNORMAL HIGH (ref 70–99)
Potassium: 3.6 mmol/L (ref 3.5–5.1)
SODIUM: 143 mmol/L (ref 135–145)
Total Bilirubin: 0.4 mg/dL (ref 0.3–1.2)
Total Protein: 7.7 g/dL (ref 6.5–8.1)

## 2018-11-28 MED ORDER — ANASTROZOLE 1 MG PO TABS: 1.0000 mg | ORAL_TABLET | Freq: Every day | ORAL | 3 refills | Status: DC

## 2018-11-28 NOTE — Progress Notes (Signed)
[facility] NOTE  Patient Care Team: [name], MD as PCP - General Irish Lack, Charlann Lange, MD as PCP - Cardiology (Cardiology) [place], Maree [place], LCSW as [place], Benjamin, MD as Consulting Physician (General Surgery) Nicholas Lose, MD as Consulting Physician (Hematology and Oncology) Kyung Rudd, MD as Consulting Physician (Radiation Oncology)  CHIEF COMPLAINTS/PURPOSE OF CONSULTATION: Newly diagnosed breast cancer  HISTORY OF PRESENTING ILLNESS:  [name] 85 y.o. 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 The cancer was detected on a diagnostic mammogram on 10/30/18 and was palpable prior to diagnosis. It showed a 5.7cm mass in the right breast with no lymphadenopathy. A biopsy from 11/19/18 showed the cancer to be grade 2, HER2 negative, ER 100%, PR 80%, Ki67 5%.   She presents to the clinic today with her daughter, granddaughter, and son. She reports a family history of breast cancer in her maternal aunt who was over 34 years old. He daughter notes she has a history of chronic kidney disease, heart disease, and diabetes and is worried about treatment worsening these conditions and if surgery is possible. Her lab work from today shows WBC 3.5, Hg 10.8, platelets 110, and creatinine 2.35. She is currently on Eliquis and Coumadin and will follow-up with her PCP.   I reviewed her records extensively and collaborated the history with the patient.  SUMMARY OF ONCOLOGIC HISTORY:   Malignant neoplasm of lower-inner quadrant of right breast of female, estrogen receptor positive ([place])   11/19/2018 Initial Diagnosis    Palpable right breast mass with calcifications, mammogram revealed focal asymmetry lower right breast 5:00 measuring 4.6 cm, ultrasound revealed overall size of 5.7 cm ultrasound-guided biopsy revealed grade 2 IDC ER 90% PR 80% Ki-67 5%, HER-2 negative, T3N0  stage IIa clinical stage    11/28/2018 Cancer Staging    Staging form: Breast, AJCC 8th Edition - Clinical: Stage IIA (cT3, cN0, cM0, G2, ER+, PR+, HER2-) - Signed by Nicholas Lose, MD on 11/28/2018    MEDICAL HISTORY:  Past Medical History:  Diagnosis Date  . Anemia   . Arthritis   . Cataract   . Chronic diastolic CHF (congestive heart failure) ([place])   . CKD (chronic kidney disease), stage III ([place])   . Clotting disorder ([place])   . Diabetes mellitus ([place])   . Dizziness and giddiness   . DVT (deep venous thrombosis) ([place])    a. On Coumadin for this.  . Essential hypertension   . LBBB (left bundle branch block)   . Mitral regurgitation    a. Mod MR by echo 2014.  . Muscular deconditioning   . Obesity   . SVT (supraventricular tachycardia) ([place])    a. In 2013 she had an EPS with ablation for SVT which did not eliminate the SVT completely. She also had bradycardia which limited medication. She was placed on amiodarone by Dr. Lovena Le.  . Thrombocytopenia ([place]) 11/21/2011    SURGICAL HISTORY: Past Surgical History:  Procedure Laterality Date  . EYE SURGERY    . SUPRAVENTRICULAR TACHYCARDIA ABLATION N/A 10/11/2012   Procedure: SUPRAVENTRICULAR TACHYCARDIA ABLATION;  Surgeon: Evans Lance, MD;  Location: [facility] CATH LAB;  Service: Cardiovascular;  Laterality: N/A;    SOCIAL HISTORY: Social History   Socioeconomic History  . Marital status: Widowed    Spouse name: Not on file  . Number of children: Not on file  . Years of education: Not on  file  . Highest education level: Not on file  Occupational History  . Not on file  Social Needs  . Financial resource strain: Not on file  . Food insecurity:    Worry: Not on file    Inability: Not on file  . Transportation needs:    Medical: No    Non-medical: No  Tobacco Use  . Smoking status: Never Smoker  . Smokeless tobacco: Never Used  Substance and Sexual Activity  . Alcohol use: No  . Drug use: No  . Sexual activity: Not on  file  Lifestyle  . Physical activity:    Days per week: Not on file    Minutes per session: Not on file  . Stress: Not on file  Relationships  . Social connections:    Talks on phone: Not on file    Gets together: Not on file    Attends religious service: Not on file    Active member of club or organization: Not on file    Attends meetings of clubs or organizations: Not on file    Relationship status: Not on file  . Intimate partner violence:    Fear of current or ex partner: Not on file    Emotionally abused: Not on file    Physically abused: Not on file    Forced sexual activity: Not on file  Other Topics Concern  . Not on file  Social History Narrative   Lives at home alone   She has a nursing aid who comes to her home for 2.5 hrs/day x 4 days/week    Right handed   1 cup caffeine daily    FAMILY HISTORY: Family History  Problem Relation Age of Onset  . Stroke Mother   . Cancer Father        prostate  . Hypertension Daughter   . Heart attack Brother        x2  . Hypertension Brother   . Stroke Sister   . Hypertension Sister   . Breast cancer Maternal Aunt     ALLERGIES:  is allergic to aspirin and penicillins.  MEDICATIONS:  Current Outpatient Medications  Medication Sig Dispense Refill  . ACCU-CHEK AVIVA PLUS test strip USE AS DIRECTED TO CHECK BLOOD SUGAR QID  11  . acetaminophen (TYLENOL) 500 MG tablet Take 1,000 mg by mouth every 6 (six) hours as needed.    [name] apixaban (ELIQUIS) 5 MG TABS tablet Take 1 tablet (5 mg total) by mouth 2 (two) times daily. 60 tablet 0  . BD PEN NEEDLE NANO U/F 32G X 4 MM MISC Use as directed 50 each 4  . calcium carbonate (OS-CAL) 600 MG TABS Take 600 mg by mouth daily with breakfast.     . carvedilol (COREG) 12.5 MG tablet Take 1 tablet (12.5 mg total) by mouth 2 (two) times daily. 180 tablet 3  . empagliflozin (JARDIANCE) 10 MG TABS tablet Take 10 mg by mouth daily.    . ferrous gluconate (FERGON) 324 MG tablet Take 325 mg by  mouth daily with breakfast.     . metFORMIN (GLUCOPHAGE) 500 MG tablet Take 500 mg by mouth 2 (two) times daily with a meal.    . oxybutynin (DITROPAN) 5 MG tablet Take 1 tablet (5 mg total) by mouth 2 (two) times daily. 180 tablet 1  . amiodarone (PACERONE) 200 MG tablet Take 1 tablet (200 mg total) by mouth daily. (Patient not taking: Reported on 11/28/2018) 90 tablet 2  . anastrozole (  ARIMIDEX) 1 MG tablet Take 1 tablet (1 mg total) by mouth daily. 90 tablet 3  . atorvastatin (LIPITOR) 20 MG tablet TAKE 1 TABLET BY MOUTH AT NIGHT (Patient not taking: Reported on 11/28/2018) 90 tablet 0  . Cyanocobalamin (VITAMIN B 12 PO) Take 1 tablet daily by mouth.    . diclofenac sodium (VOLTAREN) 1 % GEL Apply 2 g topically 4 (four) times daily. (Patient not taking: Reported on 11/28/2018) 100 g 1  . furosemide (LASIX) 80 MG tablet Take 1 tablet (80 mg total) by mouth 2 (two) times daily. (Patient not taking: Reported on 11/28/2018) 60 tablet 9  . HUMALOG KWIKPEN 200 UNIT/ML SOPN Inject 4-8 Units into the skin 3 (three) times daily. Per sliding scale  3  . LEVEMIR FLEXTOUCH 100 UNIT/ML Pen Inject 10-16 Units into the skin daily. 16 units in the am and 10 units in the evening  4  . losartan (COZAAR) 100 MG tablet TAKE 1/2 TABLET(50 MG) BY MOUTH DAILY (Patient not taking: No sig reported) 45 tablet 2  . magnesium oxide (MAG-OX) 400 MG tablet Take 400 mg by mouth daily.    [name] omeprazole (PRILOSEC) 40 MG capsule TAKE ONE CAPSULE BY MOUTH DAILY BEFORE A MEAL (Patient not taking: No sig reported) 90 capsule 1  . potassium chloride (MICRO-K) 10 MEQ CR capsule Take 4 tablets by mouth X's 1 day then go back to 2 tablets by mouth daily (Patient not taking: Reported on 11/28/2018) 180 capsule 1  . pregabalin (LYRICA) 75 MG capsule Take 75 mg by mouth at bedtime.     No current facility-administered medications for this visit.     REVIEW OF SYSTEMS:   Constitutional: Denies fevers, chills or abnormal night sweats Eyes: Denies  blurriness of vision, double vision or watery eyes Ears, nose, mouth, throat, and face: Denies mucositis or sore throat Respiratory: Denies cough, dyspnea or wheezes Cardiovascular: Denies palpitation, chest discomfort or lower extremity swelling Gastrointestinal:  Denies nausea, heartburn or change in bowel habits Skin: Denies abnormal skin rashes Lymphatics: Denies new lymphadenopathy or easy bruising Neurological:Denies numbness, tingling or new weaknesses Behavioral/Psych: Mood is stable, no new changes  Breast: Denies any discharge (+) palpable lump in right breast Extremities: (+) swelling in both ankles All other systems were reviewed with the patient and are negative.  PHYSICAL EXAMINATION: ECOG PERFORMANCE STATUS: 2 - Symptomatic, <50% confined to bed  Vitals:   11/28/18 1254  BP: (!) 141/75  Pulse: 65  Resp: 17  Temp: 98.4 F (36.9 C)  SpO2: 100%   Filed Weights   11/28/18 1254  Weight: 85.1 kg    GENERAL:alert, no distress and comfortable SKIN: skin color, texture, turgor are normal, no rashes or significant lesions EYES: normal, conjunctiva are pink and non-injected, sclera clear OROPHARYNX:no exudate, no erythema and lips, buccal mucosa, and tongue normal  NECK: supple, thyroid normal size, non-tender, without nodularity LYMPH:  no palpable lymphadenopathy in the cervical, axillary or inguinal LUNGS: clear to auscultation and percussion with normal breathing effort HEART: regular rate & rhythm and no murmurs (+) lower extremity edema ABDOMEN:abdomen soft, non-tender and normal bowel sounds Musculoskeletal:no cyanosis of digits and no clubbing  PSYCH: alert & oriented x 3 with fluent speech NEURO: no focal motor/sensory deficits BREAST: Large palpable lump in the right breast with bruising. No palpable axillary supraclavicular or infraclavicular adenopathy no breast tenderness or nipple discharge. (exam performed in the presence of a chaperone)  LABORATORY  DATA:  I have reviewed the data as  listed Lab Results  Component Value Date   WBC 3.5 (L) 11/28/2018   HGB 10.8 (L) 11/28/2018   HCT 33.5 (L) 11/28/2018   MCV 93.6 11/28/2018   PLT 110 (L) 11/28/2018   Lab Results  Component Value Date   NA 143 11/28/2018   K 3.6 11/28/2018   CL 98 11/28/2018   CO2 33 (H) 11/28/2018    RADIOGRAPHIC STUDIES: I have personally reviewed the radiological reports and agreed with the findings in the report.  ASSESSMENT AND PLAN:  Malignant neoplasm of lower-inner quadrant of right breast of female, estrogen receptor positive ([place]) 11/19/2018:Palpable right breast mass with calcifications, mammogram revealed focal asymmetry lower right breast 5:00 measuring 4.6 cm, ultrasound revealed overall size of 5.7 cm ultrasound-guided biopsy revealed grade 2 IDC ER 90% PR 80% Ki-67 5%, HER-2 negative, T3N0 stage IIa clinical stage  Pathology and radiology counseling: Discussed with the patient, the details of pathology including the type of breast cancer,the clinical staging, the significance of ER, PR and HER-2/neu receptors and the implications for treatment. After reviewing the pathology in detail, we proceeded to discuss the different treatment options between surgery and antiestrogen therapies.  Treatment plan: 1.  Neoadjuvant antiestrogen therapy with anastrozole started today 2.  Once cardiac clearance is obtained patient may be eligible for mastectomy 3.  Plus/minus radiation 4.  Followed by adjuvant antiestrogen therapy with anastrozole  Patient has major comorbidities including atrial fibrillation, congestive heart failure, chronic renal failure, diabetes and is wheelchair bound. I discussed with her the results of blood work including elevation of creatinine which is suspected to be due to Lasix.  The dose was recently increased.  She will call Dr. Irish Lack and discuss her medications.  Return to clinic in 3 months for another assessment or if she does  undergo surgery then I will see her postoperatively.  All questions were answered. The patient knows to call the clinic with any problems, questions or concerns.   Nicholas Lose, MD 11/28/2018   Julious Oka Dorshimer, am acting as scribe for Nicholas Lose, MD.  I have reviewed the above documentation for accuracy and completeness, and I agree with the above.

## 2018-11-28 NOTE — Patient Instructions (Signed)

## 2018-11-28 NOTE — Therapy (Signed)
[place], [place], [address] Phone: [phone]   Fax:  [phone]  Physical Therapy Evaluation  Patient Details  Name: [name] MRN: [id number] Date of Birth: 08/09/1933 Referring Provider (PT): Dr. Excell Seltzer   Encounter Date: 11/28/2018  PT End of Session - 11/28/18 1336    Visit Number  1    Number of Visits  2    Date for PT Re-Evaluation  01/23/19    PT Start Time  1340    PT Stop Time  1405    PT Time Calculation (min)  25 min    Activity Tolerance  Patient tolerated treatment well    Behavior During Therapy  [facility] for tasks assessed/performed       Past Medical History:  Diagnosis Date  . Anemia   . Arthritis   . Cataract   . Chronic diastolic CHF (congestive heart failure) ([place])   . CKD (chronic kidney disease), stage III ([place])   . Clotting disorder ([place])   . Diabetes mellitus ([place])   . Dizziness and giddiness   . DVT (deep venous thrombosis) ([place])    a. On Coumadin for this.  . Essential hypertension   . LBBB (left bundle branch block)   . Mitral regurgitation    a. Mod MR by echo 2014.  . Muscular deconditioning   . Obesity   . SVT (supraventricular tachycardia) ([place])    a. In 2013 she had an EPS with ablation for SVT which did not eliminate the SVT completely. She also had bradycardia which limited medication. She was placed on amiodarone by Dr. Lovena Le.  . Thrombocytopenia ([place]) 11/21/2011    Past Surgical History:  Procedure Laterality Date  . EYE SURGERY    . SUPRAVENTRICULAR TACHYCARDIA ABLATION N/A 10/11/2012   Procedure: SUPRAVENTRICULAR TACHYCARDIA ABLATION;  Surgeon: Evans Lance, MD;  Location: [facility] C[facility] LAB;  Service: Cardiovascular;  Laterality: N/A;    There were no vitals filed for this visit.   Subjective Assessment - 11/28/18 1326    Subjective  Patient reports she is here today to be seen by her medical team for her newly diagnosed right breast  cancer. She is currently at an inpatient rehab facility due to a leg injury which required a hospitalization and will be discharged on 11/30/2018 to live with her daughter.    Pertinent History  Patient was diagnosed on 10/30/2018 with right grade II invasive ductal carcinoma breast cancer. It measures 4.6 cm and is located in the power inner quadrant. It is ER/PR positive and HER2 negative with a Ki67 of 5%.  History of CVA 2018; recent hospitalization due to fall and currently in rehab facility until 11/30/2018.    Patient Stated Goals  Reduce lymphedema risk and learn post op shoulder ROM HEP    Currently in Pain?  No/denies         [facility] PT Assessment - 11/28/18 0001      Assessment   Medical Diagnosis  Right breast cancer    Referring Provider (PT)  Dr. Excell Seltzer    Onset Date/Surgical Date  10/30/18    Hand Dominance  Right    Prior Therapy  In skilled rehab facility currently      Precautions   Precautions  Other (comment)    Precaution Comments  active cancer; fall risk      Restrictions   Weight Bearing Restrictions  No      Balance Screen   Has  the patient fallen in the past 6 months  No    Has the patient had a decrease in activity level because of a fear of falling?   No    Is the patient reluctant to leave their home because of a fear of falling?   No      Home Environment   Living Environment  Private residence    Living Arrangements  Children   In rehab facility; moving in with daughter 11/30/2018   Available Help at Discharge  Family      Prior Function   Level of Independence  Independent;Requires assistive device for independence   Uses rolling walker   Vocation  Retired    Leisure  She walks short distances with a walker      Cognition   Overall Cognitive Status  Within Functional Limits for tasks assessed      Posture/Postural Control   Posture/Postural Control  Postural limitations    Postural Limitations  Forward head;Rounded Shoulders;Flexed trunk       ROM / Strength   AROM / PROM / Strength  AROM;Strength      AROM   AROM Assessment Site  Shoulder;Cervical    Right/Left Shoulder  Right;Left    Right Shoulder Extension  65 Degrees    Right Shoulder Flexion  105 Degrees    Right Shoulder ABduction  109 Degrees    Right Shoulder Internal Rotation  50 Degrees    Right Shoulder External Rotation  68 Degrees    Left Shoulder Extension  34 Degrees    Left Shoulder Flexion  138 Degrees    Left Shoulder ABduction  118 Degrees    Left Shoulder Internal Rotation  72 Degrees    Left Shoulder External Rotation  50 Degrees    Cervical Flexion  WNL    Cervical Extension  WNL    Cervical - Right Side Bend  25% limited    Cervical - Left Side Bend  25% limited    Cervical - Right Rotation  WNL    Cervical - Left Rotation  WNL        LYMPHEDEMA/ONCOLOGY QUESTIONNAIRE - 11/28/18 1333      Type   Cancer Type  right breast cancer      Lymphedema Assessments   Lymphedema Assessments  Upper extremities      Right Upper Extremity Lymphedema   10 cm Proximal to Olecranon Process  28.9 cm    Olecranon Process  22.3 cm    10 cm Proximal to Ulnar Styloid Process  18.8 cm    Just Proximal to Ulnar Styloid Process  15.7 cm    Across Hand at [employer]  20.1 cm    At [place] of 2nd Digit  6.7 cm      Left Upper Extremity Lymphedema   10 cm Proximal to Olecranon Process  30.9 cm    Olecranon Process  23.2 cm    10 cm Proximal to Ulnar Styloid Process  19.1 cm    Just Proximal to Ulnar Styloid Process  15 cm    Across Hand at [employer]  19.1 cm    At [place] of 2nd Digit  6.1 cm             Objective measurements completed on examination: See above findings.     Patient was instructed today in a home exercise program today for post op shoulder range of motion. These included active assist shoulder flexion in sitting, scapular  retraction, wall walking with shoulder abduction, and hands behind head external rotation.  She was  encouraged to do these twice a day, holding 3 seconds and repeating 5 times when permitted by her physician.       PT Education - 11/28/18 1334    Education Details  Lymphedema risk reduction and post op shoulder ROM HEP    Person(s) Educated  Patient    Methods  Explanation;Demonstration;Handout    Comprehension  Returned demonstration;Verbalized understanding          PT Long Term Goals - 11/28/18 1511      PT LONG TERM GOAL #1   Title  Patient will demonstrate she has regained shoulder ROM and function post operatively compared to baseline measurements.    Time  8    Period  Weeks    Status  New      Breast Clinic Goals - 11/28/18 1510      Patient will be able to verbalize understanding of pertinent lymphedema risk reduction practices relevant to her diagnosis specifically related to skin care.   Time  1    Period  Days    Status  Achieved      Patient will be able to return demonstrate and/or verbalize understanding of the post-op home exercise program related to regaining shoulder range of motion.   Time  1    Period  Days    Status  Achieved      Patient will be able to verbalize understanding of the importance of attending the postoperative After Breast Cancer Class for further lymphedema risk reduction education and therapeutic exercise.   Time  1    Period  Days    Status  Achieved            Plan - 11/28/18 1336    Clinical Impression Statement  Patient was diagnosed on 10/30/2018 with right grade II invasive ductal carcinoma breast cancer. It measures 4.6 cm and is located in the power inner quadrant. It is ER/PR positive and HER2 negative with a Ki67 of 5%.  Her multidisciplinary medical team met prior to her assessments to determine a recommended treatment plan. She is planning to have a right mastectomy and sentinel node biopsy followed by possible radiation and anti-estrogen therapy. She will benefit from post op PT to regain shoulder ROM and reduce  lymphedema risk.     History and Personal Factors relevant to plan of care:  CVA; recent hospitalization for fall and is now in rehab facility;     Clinical Presentation  Stable    Clinical Decision Making  Low    Rehab Potential  Good    Clinical Impairments Affecting Rehab Potential  Comorbidities    PT Frequency  --   Eval and 1 f/u visit   PT Treatment/Interventions  ADLs/Self Care Home Management;Patient/family education;Therapeutic exercise    PT Next Visit Plan  Will reassess 3-4 weeks post op to determine needs    PT Home Exercise Plan  Post op shoulder ROM HEP    Consulted and Agree with Plan of Care  Patient       Patient will benefit from skilled therapeutic intervention in order to improve the following deficits and impairments:  Decreased range of motion, Impaired UE functional use, Pain, Decreased knowledge of precautions, Postural dysfunction, Decreased mobility, Difficulty walking  Visit Diagnosis: Malignant neoplasm of lower-inner quadrant of right breast of female, estrogen receptor positive ([place]) - Plan: PT plan of care cert/re-cert  Abnormal posture -  Plan: PT plan of care cert/re-cert  Stiffness of right shoulder, not elsewhere classified - Plan: PT plan of care cert/re-cert   Patient will follow up at outpatient cancer rehab 3-4 weeks following surgery.  If the patient requires physical therapy at that time, a specific plan will be dictated and sent to the referring physician for approval. The patient was educated today on appropriate basic range of motion exercises to begin post operatively and the importance of attending the After Breast Cancer class following surgery.  Patient was educated today on lymphedema risk reduction practices as it pertains to recommendations that will benefit the patient immediately following surgery.  She verbalized good understanding.      Problem List Patient Active Problem List   Diagnosis Date Noted  . Malignant neoplasm of  lower-inner quadrant of right breast of female, estrogen receptor positive ([place]) 11/23/2018  . Hematoma of right iliopsoas muscle 11/06/2018  . Intramuscular hematoma 11/06/2018  . Hypertensive heart disease with heart failure ([place]) 09/05/2017  . Aphasia 08/31/2017  . Dysphagia 08/31/2017  . CKD (chronic kidney disease), stage III ([place])   . LBBB (left bundle branch block)   . Anemia   . HTN (hypertension)   . Obesity   . DVT (deep venous thrombosis) ([place])   . DM (diabetes mellitus) ([place] [facility] [place]) 07/28/2015  . Fall 07/28/2015  . Warfarin-induced coagulopathy ([place]) 07/28/2015  . Chronic diastolic heart failure ([place]) 03/19/2014  . Edema 08/06/2013  . SVT (supraventricular tachycardia) ([place]) 10/15/2012  . First degree AV block 10/15/2012  . Acute renal insufficiency 10/14/2012  . Dizziness and giddiness   . Thrombocytopenia ([place]) 11/21/2011   Annia Friendly, PT 11/28/18 3:40 PM  [place], [place], [address] Phone: [phone]   Fax:  [phone]  Name: KAMAURI KATHOL MRN: [id number] Date of Birth: 1933/03/31

## 2018-11-28 NOTE — Progress Notes (Signed)
[place] Psychosocial Distress Screening Spiritual Care  Met with [name] in Breast Multidisciplinary Clinic to introduce [facility] team/resources, reviewing distress screen per protocol.  The patient scored a 2 on the Psychosocial Distress Thermometer which indicates mild distress. Also assessed for distress and other psychosocial needs.   [facility] DISTRESS SCREENING 11/28/2018  Screening Type Initial Screening  Distress experienced in past week (1-10) 2  Referral to support programs Yes   The pt presented to Breast Clinic with family. The pt expressed that she has found some relief from additional information about her diagnosis and treatment, but adjusting to her illness has kept her distress at a 2. The pt reported no current need for support programs, but she reported that she will reach out if any needs arise.  Follow up needed: No.  Doris Cheadle, Counseling Intern [phone]

## 2018-11-28 NOTE — Progress Notes (Signed)
Radiation Oncology         [phone] ________________________________  Name: [name]        MRN: [id number]  Date of Service: 11/28/2018 DOB: November 28, 1932  RJ:JOACZYS, Bailey Mech, MD  [name], MD     REFERRING PHYSICIAN: [name], MD   DIAGNOSIS: The encounter diagnosis was Malignant neoplasm of lower-inner quadrant of right breast of female, estrogen receptor positive ([place]).  Cancer Staging Malignant neoplasm of lower-inner quadrant of right breast of female, estrogen receptor positive ([place]) Staging form: Breast, AJCC 8th Edition - Clinical: Stage IIA (cT3, cN0, cM0, G2, ER+, PR+, HER2-) - Signed by Nicholas Lose, MD on 11/28/2018   Stage IIA, T3, Nx, Mx, Right Breast, LIQ, Invasive Ductal Carcinoma, ER (+), PR (+), HER2 (neg), grade 2  HISTORY OF PRESENT ILLNESS: [name] is a 85 y.o. female seen in the multidisciplinary breast clinic for a new diagnosis of right breast cancer. The patient was noted to have a palpable right breast mass.  She underwent bilateral diagnostic mammography with tomography and right breast ultrasonography on 10/30/2018 showing: Suspicious right breast mass corresponding with the patient's palpable lump. Associated density on the mammogram spans at least 5.7 x 5.5 cm. No suspicious right axillary lymphadenopathy. No mammographic evidence of malignancy on the left.  Accordingly on 11/19/2018 she proceeded to biopsy of the right breast area in question. The pathology from this procedure showed: Breast, right, needle core biopsy, 5 o'clock with invasive ductal carcinoma. Prognostic indicators significant for: ER, 100% positive with strong staining intensity and PR, 80% positive with moderate staining intensity. Proliferation marker Ki67 at 5%. HER2 negative.    PREVIOUS RADIATION THERAPY: No   PAST MEDICAL HISTORY:  Past Medical History:  Diagnosis Date  . Anemia   . Arthritis   . Cataract   . Chronic diastolic CHF (congestive heart failure)  ([place])   . CKD (chronic kidney disease), stage III ([place])   . Clotting disorder ([place])   . Diabetes mellitus ([place])   . Dizziness and giddiness   . DVT (deep venous thrombosis) ([place])    a. On Coumadin for this.  . Essential hypertension   . LBBB (left bundle branch block)   . Mitral regurgitation    a. Mod MR by echo 2014.  . Muscular deconditioning   . Obesity   . SVT (supraventricular tachycardia) ([place])    a. In 2013 she had an EPS with ablation for SVT which did not eliminate the SVT completely. She also had bradycardia which limited medication. She was placed on amiodarone by Dr. Lovena Le.  . Thrombocytopenia ([place]) 11/21/2011       PAST SURGICAL HISTORY: Past Surgical History:  Procedure Laterality Date  . EYE SURGERY    . SUPRAVENTRICULAR TACHYCARDIA ABLATION N/A 10/11/2012   Procedure: SUPRAVENTRICULAR TACHYCARDIA ABLATION;  Surgeon: Evans Lance, MD;  Location: [facility] CATH LAB;  Service: Cardiovascular;  Laterality: N/A;     FAMILY HISTORY:  Family History  Problem Relation Age of Onset  . Stroke Mother   . Cancer Father        prostate  . Hypertension Daughter   . Heart attack Brother        x2  . Hypertension Brother   . Stroke Sister   . Hypertension Sister   . Breast cancer Maternal Aunt      SOCIAL HISTORY:  reports that she has never smoked. She has never used smokeless tobacco. She reports that she does not drink alcohol or  use drugs.   ALLERGIES: Aspirin and Penicillins   MEDICATIONS:  Current Outpatient Medications  Medication Sig Dispense Refill  . ACCU-CHEK AVIVA PLUS test strip USE AS DIRECTED TO CHECK BLOOD SUGAR QID  11  . acetaminophen (TYLENOL) 500 MG tablet Take 1,000 mg by mouth every 6 (six) hours as needed.    [name] amiodarone (PACERONE) 200 MG tablet Take 1 tablet (200 mg total) by mouth daily. (Patient not taking: Reported on 11/28/2018) 90 tablet 2  . anastrozole (ARIMIDEX) 1 MG tablet Take 1 tablet (1 mg total) by mouth daily. 90 tablet 3  .  apixaban (ELIQUIS) 5 MG TABS tablet Take 1 tablet (5 mg total) by mouth 2 (two) times daily. 60 tablet 0  . atorvastatin (LIPITOR) 20 MG tablet TAKE 1 TABLET BY MOUTH AT NIGHT (Patient not taking: Reported on 11/28/2018) 90 tablet 0  . BD PEN NEEDLE NANO U/F 32G X 4 MM MISC Use as directed 50 each 4  . calcium carbonate (OS-CAL) 600 MG TABS Take 600 mg by mouth daily with breakfast.     . carvedilol (COREG) 12.5 MG tablet Take 1 tablet (12.5 mg total) by mouth 2 (two) times daily. 180 tablet 3  . Cyanocobalamin (VITAMIN B 12 PO) Take 1 tablet daily by mouth.    . diclofenac sodium (VOLTAREN) 1 % GEL Apply 2 g topically 4 (four) times daily. (Patient not taking: Reported on 11/28/2018) 100 g 1  . empagliflozin (JARDIANCE) 10 MG TABS tablet Take 10 mg by mouth daily.    . ferrous gluconate (FERGON) 324 MG tablet Take 325 mg by mouth daily with breakfast.     . furosemide (LASIX) 80 MG tablet Take 1 tablet (80 mg total) by mouth 2 (two) times daily. (Patient not taking: Reported on 11/28/2018) 60 tablet 9  . HUMALOG KWIKPEN 200 UNIT/ML SOPN Inject 4-8 Units into the skin 3 (three) times daily. Per sliding scale  3  . LEVEMIR FLEXTOUCH 100 UNIT/ML Pen Inject 10-16 Units into the skin daily. 16 units in the am and 10 units in the evening  4  . losartan (COZAAR) 100 MG tablet TAKE 1/2 TABLET(50 MG) BY MOUTH DAILY (Patient not taking: No sig reported) 45 tablet 2  . magnesium oxide (MAG-OX) 400 MG tablet Take 400 mg by mouth daily.    . metFORMIN (GLUCOPHAGE) 500 MG tablet Take 500 mg by mouth 2 (two) times daily with a meal.    . omeprazole (PRILOSEC) 40 MG capsule TAKE ONE CAPSULE BY MOUTH DAILY BEFORE A MEAL (Patient not taking: No sig reported) 90 capsule 1  . oxybutynin (DITROPAN) 5 MG tablet Take 1 tablet (5 mg total) by mouth 2 (two) times daily. 180 tablet 1  . potassium chloride (MICRO-K) 10 MEQ CR capsule Take 4 tablets by mouth X's 1 day then go back to 2 tablets by mouth daily (Patient not taking:  Reported on 11/28/2018) 180 capsule 1  . pregabalin (LYRICA) 75 MG capsule Take 75 mg by mouth at bedtime.     No current facility-administered medications for this encounter.      REVIEW OF SYSTEMS: On review of systems, the patient reports that she is doing well overall. She denies any chest pain, shortness of breath, cough, fevers, chills, night sweats, unintended weight changes. She denies any bowel or bladder disturbances, and denies abdominal pain, nausea or vomiting. She denies any new musculoskeletal or joint aches or pains. A complete review of systems is obtained and is otherwise negative.  PHYSICAL EXAM:  Wt Readings from Last 3 Encounters:  11/28/18 187 lb 11.2 oz (85.1 kg)  11/09/18 180 lb 8.9 oz (81.9 kg)  10/10/18 191 lb 3.2 oz (86.7 kg)   Temp Readings from Last 3 Encounters:  11/28/18 98.4 F (36.9 C) (Oral)  11/09/18 98.2 F (36.8 C) (Oral)  10/10/18 97.6 F (36.4 C) (Oral)   BP Readings from Last 3 Encounters:  11/28/18 (!) 141/75  11/09/18 (!) 113/54  10/10/18 130/70   Pulse Readings from Last 3 Encounters:  11/28/18 65  11/09/18 (!) 55  10/10/18 (!) 58     In general this is a well appearing African-American female in no acute distress. She is alert and oriented x4 and appropriate throughout the examination. HEENT reveals that the patient is normocephalic, atraumatic. EOMs are intact. Skin is intact without any evidence of gross lesions. Cardiovascular exam reveals a regular rate and rhythm, no clicks rubs or murmurs are auscultated. Chest is clear to auscultation bilaterally. Lymphatic assessment is performed and does not reveal any adenopathy in the cervical, supraclavicular, axillary, or inguinal chains.  Abdomen has active bowel sounds in all quadrants and is intact. The abdomen is soft, non tender, non distended. Lower extremities are negative for pretibial pitting edema, deep calf tenderness, cyanosis or clubbing.   ECOG = 1  0 - Asymptomatic  (Fully active, able to carry on all predisease activities without restriction)  1 - Symptomatic but completely ambulatory (Restricted in physically strenuous activity but ambulatory and able to carry out work of a light or sedentary nature. For example, light housework, office work)  2 - Symptomatic, <50% in bed during the day (Ambulatory and capable of all self care but unable to carry out any work activities. Up and about more than 50% of waking hours)  3 - Symptomatic, >50% in bed, but not bedbound (Capable of only limited self-care, confined to bed or chair 50% or more of waking hours)  4 - Bedbound (Completely disabled. Cannot carry on any self-care. Totally confined to bed or chair)  5 - Death   Eustace Pen MM, Creech RH, Tormey DC, et al. [phone]). "Toxicity and response criteria of the [facility]". [place] Oncol. 5 (6): 649-55    LABORATORY DATA:  Lab Results  Component Value Date   WBC 3.5 (L) 11/28/2018   HGB 10.8 (L) 11/28/2018   HCT 33.5 (L) 11/28/2018   MCV 93.6 11/28/2018   PLT 110 (L) 11/28/2018   Lab Results  Component Value Date   NA 143 11/28/2018   K 3.6 11/28/2018   CL 98 11/28/2018   CO2 33 (H) 11/28/2018   Lab Results  Component Value Date   ALT 29 11/28/2018   AST 24 11/28/2018   ALKPHOS 95 11/28/2018   BILITOT 0.4 11/28/2018      RADIOGRAPHY: Ct Pelvis Wo Contrast  Result Date: 11/06/2018 CLINICAL DATA:  Right hip and groin pain which began when the patient attempted to get out of her wheelchair this morning. Initial encounter. EXAM: CT OF THE LOWER BILATERAL EXTREMITY WITHOUT CONTRAST TECHNIQUE: Multidetector CT imaging of the lower bilateral extremity was performed according to the standard protocol. COMPARISON:  None. FINDINGS: Bones/Joint/Cartilage The hips are located. No fracture is identified. Facet degenerative disease results in 0.6 cm anterolisthesis L4 on L5. There is disc bulging at both L4-5 and L5-S1. Foraminal  narrowing is seen at both levels which appears worst bilaterally at L5-S1. Mild bilateral hip osteoarthritis is noted. Sacroiliac joints and  symphysis pubis appear normal. Ligaments Suboptimally assessed by CT. Muscles and Tendons There is a small volume of hemorrhage and associated swelling in the right iliopsoas near the musculotendinous junction consistent with strain. No frank tear is identified on CT scan. Soft tissues Imaged intrapelvic contents demonstrate moderate distention of the urinary bladder. The patient is status post hysterectomy. IMPRESSION: Findings consistent with strain of the right iliopsoas with some associated hemorrhage and swelling at the musculotendinous junction. Negative for fracture. Lower lumbar spondylosis. Electronically Signed   By: Inge Rise M.D.   On: 11/06/2018 18:17   Mr Brain Wo Contrast  Result Date: 11/06/2018 CLINICAL DATA:  Altered level of consciousness.  Possible stroke. EXAM: MRI HEAD WITHOUT CONTRAST TECHNIQUE: Multiplanar, multiecho pulse sequences of the brain and surrounding structures were obtained without intravenous contrast. COMPARISON:  11/17/2017 FINDINGS: Brain: Diffusion imaging does not show any acute or subacute infarction. Brainstem and cerebellum are normal. Cerebral hemispheres are normal except for very minimal small vessel change of the hemispheric deep white matter, less than often seen at this age. No cortical or large vessel territory infarction. No mass lesion, hemorrhage, hydrocephalus or extra-axial collection. Vascular: Major vessels at the base of the brain show flow. Skull and upper cervical spine: Negative Sinuses/Orbits: Clear/normal.  Previous lens implant on the right. Other: None IMPRESSION: Normal examination for age. No acute or reversible finding. Very minimal small vessel change of the hemispheric deep white matter, less than often seen at this age. Electronically Signed   By: Nelson Chimes M.D.   On: 11/06/2018 15:02   Mm  Clip Placement Right  Result Date: 11/19/2018 CLINICAL DATA:  Status post ultrasound-guided core biopsy of the right breast. EXAM: DIAGNOSTIC RIGHT MAMMOGRAM POST ULTRASOUND BIOPSY COMPARISON:  Previous exam(s). FINDINGS: Mammographic images were obtained following ultrasound guided biopsy of the right breast. Mammographic images show there is a ribbon shaped clip in appropriate position in the 5 o'clock region of the right breast. IMPRESSION: Status post ultrasound-guided core biopsy of the right breast with pathology pending. Final Assessment: Post Procedure Mammograms for Marker Placement Electronically Signed   By: Lillia Mountain M.D.   On: 11/19/2018 15:13   Dg Hip Unilat  With Pelvis 2-3 Views Right  Result Date: 11/06/2018 CLINICAL DATA:  Right hip and groin pain after attempting to get out of wheelchair this morning. EXAM: DG HIP (WITH OR WITHOUT PELVIS) 2-3V RIGHT COMPARISON:  None. FINDINGS: The bones are demineralized. No evidence of acute fracture, dislocation or femoral head avascular necrosis. The hip joint spaces are relatively preserved. There are degenerative changes within the lower lumbar spine and sacroiliac joints. Bilateral pelvic calcifications are likely phleboliths. IMPRESSION: No acute osseous findings or significant hip arthropathy. If the patient has persistent hip pain or inability to bear weight, follow up imaging may be warranted as acute hip fractures can be radiographically occult in the elderly. Lumbar spondylosis. Electronically Signed   By: Richardean Sale M.D.   On: 11/06/2018 13:13   [place] Rt Breast Bx W Loc Dev 1st Lesion Img Bx Spec US Guide  Addendum Date: 11/21/2018   ADDENDUM REPORT: 11/20/2018 15:19 ADDENDUM: Pathology revealed GRADE II INVASIVE DUCTAL CARCINOMA of the Right breast, 5 o'clock. This was found to be concordant by Dr. Lillia Mountain. Pathology results were discussed with the patient's daughter, [name] by telephone, per patient request. Ms. [name]  reported doing well after the biopsy with tenderness at the site. Post biopsy instructions and care were reviewed and questions were answered.  The patient's daughter was encouraged to call [facility] for any additional concerns. The patient was referred to [facility] on November 28, 2018. Pathology results reported by Terie Purser, RN on 11/20/2018. Electronically Signed   By: Lillia Mountain M.D.   On: 11/20/2018 15:19   Result Date: 11/21/2018 CLINICAL DATA:  Right breast mass. EXAM: ULTRASOUND GUIDED RIGHT BREAST CORE NEEDLE BIOPSY COMPARISON:  Previous exam(s). FINDINGS: I met with the patient and we discussed the procedure of ultrasound-guided biopsy, including benefits and alternatives. We discussed the high likelihood of a successful procedure. We discussed the risks of the procedure, including infection, bleeding, tissue injury, clip migration, and inadequate sampling. Informed written consent was given. The usual time-out protocol was performed immediately prior to the procedure. Lesion quadrant: Lower inner quadrant Using sterile technique and 1% lidocaine and 1% lidocaine with epinephrine as local anesthetic, under direct ultrasound visualization, a 12 gauge spring-loaded device was used to perform biopsy of a mass in the 5 o'clock region of the right breast using an inferior to superior approach. At the conclusion of the procedure a ribbon shaped tissue marker clip was deployed into the biopsy cavity. Follow up 2 view mammogram was performed and dictated separately. IMPRESSION: Ultrasound guided biopsy of the right breast. No apparent complications. Electronically Signed: By: Lillia Mountain M.D. On: 11/19/2018 15:01       IMPRESSION/PLAN: 1. Stage IIA, T3, Nx, Mx, Right Breast, LIQ, Invasive Ductal Carcinoma, ER (+), PR (+), HER2 (neg), grade 2  Per Dr. Landis Gandy' recommendations, patient will start on  hormone therapy prior to surgical intervention. Per Dr. Sharee Pimple' recommendations, the patient will undergo mastectomy with sentinel node biopsy. Patient will need cardiac workup prior to surgery, which Dr. Excell Seltzer will set up.   If we proceed with radiation depending on size of tumor, it will take place 4 weeks s/p surgery with a simulation appointment prior. If treated with radiation, she will receive 6.5 weeks of radiation to the right breast to prevent locoregional recurrence. Patient advised that the risks are but not limited to, skin irritation and mild fatigue towards the end of radiation treatments. Discussed with the patient that any and all side effects from radiation will be monitored and that the patien twill be given a cream during radiation treatment to help with skin irritation. Advised that the skin irritation will occur 2-3 weeks into radiation treatment and that we will monitor it as needed.    Advised use of annual mammograms after completion of treatment and stressed the importance of this.   Patient will follow up in the office if needed, I will be available and will continue after her surgery to be scheduled for CT simulation with treatments starting shortly afterwards. She knows to call with any questions or concerns.     ------------------------------------------------  Jodelle Gross, MD, PhD   This document serves as a record of services personally performed by Kyung Rudd, MD. It was created on his behalf by Steva Colder, a trained medical scribe. The creation of this record is based on the scribe's personal observations and the provider's statements to them. This document has been checked and approved by the attending provider.

## 2018-11-28 NOTE — Assessment & Plan Note (Signed)
11/19/2018:Palpable right breast mass with calcifications, mammogram revealed focal asymmetry lower right breast 5:00 measuring 4.6 cm, ultrasound revealed overall size of 5.7 cm ultrasound-guided biopsy revealed grade 2 IDC ER 90% PR 80% Ki-67 5%, HER-2 negative, T3N0 stage IIa clinical stage  Pathology and radiology counseling: Discussed with the patient, the details of pathology including the type of breast cancer,the clinical staging, the significance of ER, PR and HER-2/neu receptors and the implications for treatment. After reviewing the pathology in detail, we proceeded to discuss the different treatment options between surgery and antiestrogen therapies.  Treatment plan: 1.  Neoadjuvant antiestrogen therapy with anastrozole started today 2.  Once cardiac clearance is obtained patient may be eligible for mastectomy 3.  Plus/minus radiation 4.  Followed by adjuvant antiestrogen therapy with anastrozole  Patient has major comorbidities including atrial fibrillation, congestive heart failure, chronic renal failure, diabetes and is wheelchair bound.  Return to clinic in 3 months for another assessment or if she does undergo surgery then I will see her postoperatively.

## 2018-11-28 NOTE — Progress Notes (Signed)
Nutrition Assessment  Reason for Assessment:  Pt seen in Breast Clinic  ASSESSMENT:   85 year old female with new diagnosis of breast cancer.  Past medical history reviewed.    Met with patient, daughter and son in law.   Patient has been in rehab recently and planning to get out on Friday and stay with daughter.  Patient reports she has been eating well in rehab and has good appetite.  Medications:  reviewed  Labs: reviewed  Anthropometrics:   Height: 65 inches Weight: 187 lb 11.2 oz BMI: 31   NUTRITION DIAGNOSIS: Food and nutrition related knowledge deficit related to new diagnosis of breast cancer as evidenced by no prior need for nutrition related information.  INTERVENTION:   Discussed and provided packet of information regarding nutritional tips for breast cancer patients.  Questions answered.  Teachback method used.  Contact information provided and patient knows to contact me with questions/concerns.    MONITORING, EVALUATION, and GOAL: Pt will consume a healthy plant based diet to maintain lean body mass throughout treatment.   Deroy Noah B. Zenia Resides, [place], [place] Registered Dietitian [phone] (pager)

## 2018-11-29 ENCOUNTER — Telehealth: Payer: Self-pay

## 2018-11-29 ENCOUNTER — Telehealth: Payer: Self-pay | Admitting: Hematology and Oncology

## 2018-11-29 ENCOUNTER — Other Ambulatory Visit: Payer: Self-pay | Admitting: *Deleted

## 2018-11-29 ENCOUNTER — Telehealth: Payer: Self-pay | Admitting: *Deleted

## 2018-11-29 DIAGNOSIS — S76011D Strain of muscle, fascia and tendon of right hip, subsequent encounter: Secondary | ICD-10-CM | POA: Diagnosis not present

## 2018-11-29 DIAGNOSIS — E119 Type 2 diabetes mellitus without complications: Secondary | ICD-10-CM | POA: Diagnosis not present

## 2018-11-29 DIAGNOSIS — I480 Paroxysmal atrial fibrillation: Secondary | ICD-10-CM | POA: Diagnosis not present

## 2018-11-29 DIAGNOSIS — M7981 Nontraumatic hematoma of soft tissue: Secondary | ICD-10-CM | POA: Diagnosis not present

## 2018-11-29 NOTE — Telephone Encounter (Signed)
-----   Message from Shelby Mattocks, [place] sent at 11/21/2018  1:49 PM EST ----- Please call pt and inform her that I read the biopsy of her breast lump, and I'm sorry about the diagnosis. I will be praying for her. To call us if there anything we can do for her.

## 2018-11-29 NOTE — Telephone Encounter (Signed)
   [place], MD  Chart reviewed as part of pre-operative protocol coverage. Because of Ferrell F Olejniczak's past medical history and time since last visit, he/she will require a follow-up visit in order to better assess preoperative cardiovascular risk.  She was recently hospitalized.   For her Eliquis this will need to be managed from PCP.   Pre-op covering staff: - Please schedule appointment and call patient to inform them. - Please contact requesting surgeon's office via preferred method (i.e, phone, fax) to inform them of need for appointment prior to surgery.  If applicable, this message will also be routed to pharmacy pool and/or primary cardiologist for input on holding anticoagulant/antiplatelet agent as requested below so that this information is available at time of patient's appointment.   Cecilie Kicks, NP  11/29/2018, 12:25 PM

## 2018-11-29 NOTE — Telephone Encounter (Signed)
Pt has been scheduled to see Robbie Lis, PA-C, 12/06/18.

## 2018-11-29 NOTE — Telephone Encounter (Signed)
Patient takes Eliquis for DVT which is being prescribed/managed by Thomes Dinning, MD. Recommend that anticoagulation clearance come from managing provider.

## 2018-11-29 NOTE — Patient Outreach (Signed)
[name] [place]) Care Management  11/29/2018  [name] [birth year] [id number]   CSW was able to meet with patient at [place], [place] where patient currently resides to receive short-term rehabilitative services, today to perform a routine visit.  Patient was pleased to inform CSW that she is being discharged home tomorrow, Friday, November 30, 2018.  Patient continues to endorse that she will be living with her daughter, [name], who is able to assist patient with activities of daily living, as well as provide 24 hour care and supervision.  Patient also receives nursing aid services for 2 1/2 hours per day, 4 days per week, which will be resumed upon patient's return home.  CSW noted that patient was recently diagnosed with Stage IIA Breast Cancer, after a biopsy was performed on Monday, October 30, 2018.  CSW inquired about this new diagnosis, for which patient reported, "They will be taking off my right breast".  Patient went on to say that she was seen at the [facility] yesterday, Wednesday, November 28, 2018, and that it was recommended that she take some pills (hormone therapy).  After thorough review of patient's chart, CSW found that patient will receive Neoadjuvant Antiestrogen Therapy with Anastrozole, while trying to obtain cardiac clearance to be able to receive a right mastectomy. CSW offered counseling and supportive services to patient; although, patient appeared to be accepting the news well.  CSW agreed to contact patient on Monday, December 03, 2018 to follow-up to ensure that she had a smooth transition back home, as well as to assess need for continued social work involvement.  Patient reported that Mrs. Small plans to transport her to and from all her physician appointments, and that she will also be managing her medications.  Patient is agreeable to home health services being arranged for her,  but does not believe that she requires any additional durable medical equipment.  CSW agreed to speak with Gilmer Mor, Social Worker/Discharge Planning Coordinator at [facility], to assist with arranging home health services for patient.  [name], BSW, MSW, LCSW  Licensed Education officer, [facility]  Mailing [place] [address] [place], [place] [address] Physical Address-[address], [place], [place] [address] Toll Free Main # [phone] Fax # [phone] Cell # [phone]  Office # [phone] [email or web address]

## 2018-11-29 NOTE — Telephone Encounter (Signed)
   [facility] Pre-operative Risk Assessment    Request for surgical clearance:  1. What type of surgery is being performed? RIGHT TOTAL MASTECOMY   2. When is this surgery scheduled? TBD   3. What type of clearance is required (medical clearance vs. Pharmacy clearance to hold med vs. Both)? BOTH  4. Are there any medications that need to be held prior to surgery and how long?ELIQUIS   5. Practice name and name of physician performing surgery? [facility]; DR. [name] [place]   6. What is your office phone number [phone]    7.   What is your office fax number [phone]  8.   Anesthesia type (None, local, MAC, general) ? GENERAL   [name] 11/29/2018, 9:17 AM  _________________________________________________________________   (provider comments below)

## 2018-11-29 NOTE — Telephone Encounter (Signed)
No los °

## 2018-12-03 ENCOUNTER — Encounter: Payer: Self-pay | Admitting: *Deleted

## 2018-12-03 ENCOUNTER — Other Ambulatory Visit: Payer: Self-pay | Admitting: *Deleted

## 2018-12-03 ENCOUNTER — Telehealth: Payer: Self-pay | Admitting: Interventional Cardiology

## 2018-12-03 DIAGNOSIS — E119 Type 2 diabetes mellitus without complications: Secondary | ICD-10-CM | POA: Diagnosis not present

## 2018-12-03 DIAGNOSIS — M160 Bilateral primary osteoarthritis of hip: Secondary | ICD-10-CM | POA: Diagnosis not present

## 2018-12-03 DIAGNOSIS — N183 Chronic kidney disease, stage 3 (moderate): Secondary | ICD-10-CM | POA: Diagnosis not present

## 2018-12-03 DIAGNOSIS — I10 Essential (primary) hypertension: Secondary | ICD-10-CM | POA: Diagnosis not present

## 2018-12-03 DIAGNOSIS — I5032 Chronic diastolic (congestive) heart failure: Secondary | ICD-10-CM | POA: Diagnosis not present

## 2018-12-03 DIAGNOSIS — M7981 Nontraumatic hematoma of soft tissue: Secondary | ICD-10-CM | POA: Diagnosis not present

## 2018-12-03 NOTE — Patient Outreach (Signed)
[facility]) Care Management  12/03/2018  [name] [birth year] [id number]   CSW was able to make contact with patient today to ensure that she had a smooth transition back home, as patient was recently discharged from [facility] [place] where patient was residing to receive short-term rehabilitative services.  Patient admitted that she is now living with her daughter, no longer able to live alone and care for herself independently.  Patient was speaking with the home health nurse, through [facility], at the time of CSW's call.  Home health physical therapy, speech therapy and an aide have also been arranged for patient through [facility].  In talking with patient's daughter, [name], also present at the time of CSW's call, [name] reported that she will be available to provide 24 hour care and supervision to patient in the home.  [name] went on to say that she will be administering patient's medications, preparing patient's meals, transporting patient to and from all her physician appointments, assisting patient with her bathing and dressing, etc.  [name] reported that patient will not be able to return home to live alone, and that she is in the process of getting her home better equipped.  CSW will perform a case closure on patient, as all goals of treatment have been met from social work standpoint and no additional social work needs have been identified at this time.  CSW will place an order for patient to receive an RNCM, also through [employer], to provide community case management services to patient.  Patient is aware of this referral and very much in favor.  CSW will fax an update to patient's Primary Care Physician, Dr. [name] to ensure that they are aware of CSW's involvement with patient's plan of care.    [name], BSW, MSW, LCSW  Licensed Regulatory affairs officer Health System  Mailing [place] [address] [place], [place] [address] Physical Address-[address], [place], [place] [address] Toll Free Main # [phone] Fax # [phone] Cell # [phone]  Office # [phone] [name]

## 2018-12-03 NOTE — Telephone Encounter (Signed)
Patient has a scale and a monitor, that sends a report every morning. She currently moved in with her daughter and needs it re-set up at her daughter's house as patient is moving in with her.  Please call her daughter at the above number.

## 2018-12-03 NOTE — Telephone Encounter (Signed)
Spoke with the daughter, per dpr, she stated that her mother has moved addresses. The new address was updated. She also had questions about setting up the [facility] heart failure daily weight program. I advised if she had questions to contact [facility]. She had no further questions.

## 2018-12-04 ENCOUNTER — Other Ambulatory Visit: Payer: Self-pay | Admitting: *Deleted

## 2018-12-04 ENCOUNTER — Encounter: Payer: Self-pay | Admitting: *Deleted

## 2018-12-05 DIAGNOSIS — I10 Essential (primary) hypertension: Secondary | ICD-10-CM | POA: Diagnosis not present

## 2018-12-05 DIAGNOSIS — E119 Type 2 diabetes mellitus without complications: Secondary | ICD-10-CM | POA: Diagnosis not present

## 2018-12-05 DIAGNOSIS — I5032 Chronic diastolic (congestive) heart failure: Secondary | ICD-10-CM | POA: Diagnosis not present

## 2018-12-05 DIAGNOSIS — N183 Chronic kidney disease, stage 3 (moderate): Secondary | ICD-10-CM | POA: Diagnosis not present

## 2018-12-05 DIAGNOSIS — M7981 Nontraumatic hematoma of soft tissue: Secondary | ICD-10-CM | POA: Diagnosis not present

## 2018-12-05 DIAGNOSIS — M160 Bilateral primary osteoarthritis of hip: Secondary | ICD-10-CM | POA: Diagnosis not present

## 2018-12-05 NOTE — Patient Outreach (Signed)
[facility]) Care Management  12/04/2018  [name] 1933/01/06 [id number]   RN spoke with pt and verified identifiers. Pt requested RN to speak with her daughter [name]. RN spoke with the primary caregiver further concerning pt's ongoing medical issues and needs. States pt is doing well however remains limited with her ambulation but improving. RN able to completed the transition of care template and verify all medications post discharge from the facility. Offered to further engage with transition of care call and assist with any other issues. Verified [facility] is involved as caregiver requested a wheelchair. Verified PT will be visiting today and RN has encouraged caregiver to discuss all DME concerning pt's need to this agency who in turn will contact the pt's provider for the needed prescriptions (verbalized an understanding). Other requested for information on Adult Daycares. RN offered to notify the prior social worker [place]) to request a list be sent via mail of all active agencies in the area (caregiver very appreciative).   RN discussed a plan of care with goals and interventions. Caregiver adherent with enrolling pt into the program for case management services. Prevention measures discussed along with adherence to all medical appointments and medications (educated on each medication. Will inquire on any other needed resources and follow up next week to re-evaluate this plan of care.  [facility] CM Care Plan Problem One     Most Recent Value  Care Plan Problem One  Knowledge deficit related to preventive measures post SNF discharge  Role Documenting the Problem One  Care Management [place] for Problem One  Active  [facility]N Long Term Goal   Pt will actively pariticpate in preventive measures to avoid readmission over the next 60 days.  [facility] Long Term Goal Start Date  12/04/18  Interventions for Problem One Long Term Goal  Will discussed preventive  measures and verify pt has the tools needed to actively pariticpate. Will strongly encouraged pt to participate and stress the risk involved it pt does not take part in her ongoing recovery.   [facility] CM Short Term Goal #1   Adherence with all medical appointments post SNF discharge over the next 30 days.  [facility] CM Short Term Goal #1 Start Date  12/04/18  Interventions for Short Term Goal #1  Discussed the importance of attending all scheudled medical appointments to avoid acute issues from occurring. Will verify upcoming appointments and sufficient transportation  [facility] CM Short Term Goal #2   Adherence with post SNF discharge medications over the next 30 days.  [facility] CM Short Term Goal #2 Start Date  12/04/18  Interventions for Short Term Goal #2  Will discuss and review discharge medication list and confirm no discrepancies. Will offer any assist needed with education on all medications. Will also introduce [place] if needed. Will follow up on ongoing transition of care calls  of inquires    [facility] CM Care Plan Problem Two     Most Recent Value  Care Plan Problem Two  Community resources requested for Adult DayCares  Role Documenting the Problem Two  Care Management [place] for Problem Two  Active  [facility] CM Short Term Goal #1   Pt will be receptive to resources provided via social work within the next 30 days.  [facility]N CM Short Term Goal #1 Start Date  12/04/18  Interventions for Short Term Goal #2   Will contact prior social worker with this request for information to be sent to  pt for requested information (Adult Daycare services). WIll discuss some of the activities and agencies within the surrounding areas.       Raina Mina, RN Care Management Coordinator [place] Office [phone]

## 2018-12-06 ENCOUNTER — Ambulatory Visit (INDEPENDENT_AMBULATORY_CARE_PROVIDER_SITE_OTHER): Payer: Medicare Other | Admitting: Physician Assistant

## 2018-12-06 ENCOUNTER — Encounter: Payer: Self-pay | Admitting: Physician Assistant

## 2018-12-06 ENCOUNTER — Telehealth: Payer: Self-pay | Admitting: *Deleted

## 2018-12-06 ENCOUNTER — Ambulatory Visit: Payer: Medicare Other | Admitting: *Deleted

## 2018-12-06 VITALS — BP 110/60 | HR 69 | Ht 65.0 in | Wt 188.0 lb

## 2018-12-06 DIAGNOSIS — I110 Hypertensive heart disease with heart failure: Secondary | ICD-10-CM

## 2018-12-06 DIAGNOSIS — I5032 Chronic diastolic (congestive) heart failure: Secondary | ICD-10-CM

## 2018-12-06 DIAGNOSIS — Z01818 Encounter for other preprocedural examination: Secondary | ICD-10-CM

## 2018-12-06 NOTE — Patient Instructions (Signed)
Medication Instructions:  Your physician recommends that you continue on your current medications as directed. Please refer to the Current Medication list given to you today.   If you need a refill on your cardiac medications before your next appointment, please call your pharmacy.   Lab work: None ordered  If you have labs (blood work) drawn today and your tests are completely normal, you will receive your results only by: [name] MyChart Message (if you have MyChart) OR . A paper copy in the mail If you have any lab test that is abnormal or we need to change your treatment, we will call you to review the results.  Testing/Procedures: None ordered  Follow-Up: Your physician recommends that you schedule a follow-up appointment in: WITH DR. VARANASI AS PLANNED   Any Other Special Instructions Will Be Listed Below (If Applicable).

## 2018-12-06 NOTE — Telephone Encounter (Signed)
  Oncology Nurse Navigator Documentation  Navigator Location: [facility] (12/06/18 1300)   )Navigator Encounter Type: Telephone;MDC Follow-up (12/06/18 1300) Telephone: Outgoing Call;Clinic/MDC Follow-up (12/06/18 1300)                 Treatment Initiated Date: 11/28/18(started anti-estrogen) (12/06/18 1300)                                Time Spent with Patient: 15 (12/06/18 1300)

## 2018-12-06 NOTE — Progress Notes (Signed)
Cardiology Office Note    Date:  12/06/2018   ID:  [name], DOB 07-22-1933, MRN [id number]  PCP:  [name], MD  Cardiologist: Dr. Irish Lack   Chief Complaint: Surgical clearance for right total mastectomy  History of Present Illness:   [name] is a 85 y.o. female with a past medical history significant for DVT (on Eliquis, previously coumadin ), chronic diastolic CHF,  deconditioning, obesity, HTN, SVT (correlated with pre-syncope, on amiodarone), DM, chronic anemia/thrombocytopenia, LBBB, probable CKD stage III (Cr 1.3 in 08/2014), mod MR by echo 2014 who presents for surgical clearance.   Per notes in 2013 she had an EPS with ablation for SVT which did not eliminate the SVT completely. She also had bradycardia which limited medication. She was placed on amiodarone by Dr. Lovena Le. Last echo12/20/2018 EF 50-55%, mild LVH, mild MR, severe LAE, mild TR with severe pulmonary hypertension.  Seen 11/05/2016 by Dr. Irish Lack at that time the patient had been noted to be having some possible TIA symptoms. She underwent carotid Dopplers that showed minimal plaque, no significant disease as well as echocardiogram which showed normal LV function and no obvious source of embolus. A brain MRI on 11/17/2017 showed no active disease. She says that she was told by neurology that she did have a stroke that effected her speech and somewhat her swallowing.  Patient also has chronic bilateral lower extremity edema. No dyspnea. Felt  this is multifactorial in the setting of chronic diastolic heart failure and chronic venous insufficiency.  She does have a prior history of DVT and has been anticoagulated. Recommended low sodium diet.   Here today for surgical clearance.  Still uses walker at home for ambulation.  Undergoing speech and physical therapy without any acute issues.  She has chronic stable lower extremity edema.  Actually, her weight is down 2 pounds since last office visit.  Compliant  with low-sodium diet and medication.  Denies chest pain, shortness of breath, orthopnea, PND, syncope or melena.  Past Medical History:  Diagnosis Date  . Anemia   . Arthritis   . Cataract   . Chronic diastolic CHF (congestive heart failure) ([place])   . CKD (chronic kidney disease), stage III ([place])   . Clotting disorder ([place])   . Diabetes mellitus ([place])   . Dizziness and giddiness   . DVT (deep venous thrombosis) ([place])    a. On Coumadin for this.  . Essential hypertension   . LBBB (left bundle branch block)   . Mitral regurgitation    a. Mod MR by echo 2014.  . Muscular deconditioning   . Obesity   . SVT (supraventricular tachycardia) ([place])    a. In 2013 she had an EPS with ablation for SVT which did not eliminate the SVT completely. She also had bradycardia which limited medication. She was placed on amiodarone by Dr. Lovena Le.  . Thrombocytopenia ([place]) 11/21/2011    Past Surgical History:  Procedure Laterality Date  . EYE SURGERY    . SUPRAVENTRICULAR TACHYCARDIA ABLATION N/A 10/11/2012   Procedure: SUPRAVENTRICULAR TACHYCARDIA ABLATION;  Surgeon: Evans Lance, MD;  Location: [facility] CATH LAB;  Service: Cardiovascular;  Laterality: N/A;    Current Medications: Prior to Admission medications   Medication Sig Start Date End Date Taking? Authorizing Provider  ACCU-CHEK AVIVA PLUS test strip USE AS DIRECTED TO CHECK BLOOD SUGAR QID 02/03/16   [provider]  acetaminophen (TYLENOL) 500 MG tablet Take 1,000 mg by mouth every 6 (six) hours as  needed.    [provider]  amiodarone (PACERONE) 200 MG tablet Take 1 tablet (200 mg total) by mouth daily. 10/05/18   Jettie Booze, MD  anastrozole (ARIMIDEX) 1 MG tablet Take 1 tablet (1 mg total) by mouth daily. 11/28/18   Nicholas Lose, MD  apixaban (ELIQUIS) 5 MG TABS tablet Take 1 tablet (5 mg total) by mouth 2 (two) times daily. 11/09/18   Purohit, Konrad Dolores, MD  atorvastatin (LIPITOR) 20 MG tablet TAKE 1 TABLET BY MOUTH  AT NIGHT 10/04/18   [name], MD  BD PEN NEEDLE NANO U/F 32G X 4 MM MISC Use as directed 10/16/18   [name], MD  calcium carbonate (OS-CAL) 600 MG TABS Take 600 mg by mouth daily with breakfast.     [provider]  carvedilol (COREG) 12.5 MG tablet Take 1 tablet (12.5 mg total) by mouth 2 (two) times daily. 09/14/18 12/13/18  Lyda Jester M, PA-C  Cyanocobalamin (VITAMIN B 12 PO) Take 1 tablet daily by mouth.    [provider]  diclofenac sodium (VOLTAREN) 1 % GEL Apply 2 g topically 4 (four) times daily. 10/30/18   Rodriguez-Southworth, Sunday Spillers, PA-C  empagliflozin (JARDIANCE) 10 MG TABS tablet Take 10 mg by mouth daily.    [provider]  ferrous gluconate (FERGON) 324 MG tablet Take 325 mg by mouth daily with breakfast.     [provider]  furosemide (LASIX) 80 MG tablet Take 1 tablet (80 mg total) by mouth 2 (two) times daily. 07/09/18   Isabel Freese, PA  HUMALOG KWIKPEN 200 UNIT/ML SOPN Inject 4-8 Units into the skin 3 (three) times daily. Per sliding scale 03/22/16   [provider]  LEVEMIR FLEXTOUCH 100 UNIT/ML Pen Inject 10-16 Units into the skin daily. 16 units in the am and 10 units in the evening 01/19/16   [provider]  losartan (COZAAR) 100 MG tablet TAKE 1/2 TABLET(50 MG) BY MOUTH DAILY 02/06/18   Jettie Booze, MD  magnesium oxide (MAG-OX) 400 MG tablet Take 400 mg by mouth daily.    [provider]  metFORMIN (GLUCOPHAGE) 500 MG tablet Take 500 mg by mouth 2 (two) times daily with a meal.    [provider]  omeprazole (PRILOSEC) 40 MG capsule TAKE ONE CAPSULE BY MOUTH DAILY BEFORE A MEAL 10/25/18   [name], MD  oxybutynin (DITROPAN) 5 MG tablet Take 1 tablet (5 mg total) by mouth 2 (two) times daily. 10/16/18   [name], MD  potassium chloride (MICRO-K) 10 MEQ CR capsule Take 4 tablets by mouth X's 1 day then go back to 2 tablets by mouth daily 01/20/16   Lyda Jester M, PA-C  pregabalin (LYRICA) 75 MG capsule Take 75 mg by mouth at bedtime.    [provider]    Allergies:   Aspirin and Penicillins   Social History   Socioeconomic History  . Marital status: Widowed    Spouse name: Not on file  . Number of children: Not on file  . Years of education: Not on file  . Highest education level: Not on file  Occupational History  . Not on file  Social Needs  . Financial resource strain: Not on file  . Food insecurity:    Worry: Not on file    Inability: Not on file  . Transportation needs:    Medical: No    Non-medical: No  Tobacco Use  . Smoking status: Never Smoker  . Smokeless tobacco:  Never Used  Substance and Sexual Activity  . Alcohol use: No  . Drug use: No  . Sexual activity: Not on file  Lifestyle  . Physical activity:    Days per week: Not on file    Minutes per session: Not on file  . Stress: Not on file  Relationships  . Social connections:    Talks on phone: Not on file    Gets together: Not on file    Attends religious service: Not on file    Active member of club or organization: Not on file    Attends meetings of clubs or organizations: Not on file    Relationship status: Not on file  Other Topics Concern  . Not on file  Social History Narrative   Lives at home alone   She has a nursing aid who comes to her home for 2.5 hrs/day x 4 days/week    Right handed   1 cup caffeine daily     Family History:  The patient's family history includes Breast cancer in her maternal aunt; Cancer in her father; Heart attack in her brother; Hypertension in her brother, daughter, and sister; Stroke in her mother and sister.   ROS:   Please see the history of present illness.    ROS All other systems reviewed and are negative.   PHYSICAL EXAM:   VS:  BP 110/60   Pulse 69   Ht 5\' 5"  (1.651 m)   Wt 188 lb (85.3 kg)   SpO2 (!) 85%   BMI 31.28 kg/m    GEN: Well nourished, well developed, in no acute distress   HEENT: normal  Neck: no JVD, carotid bruits, or masses Cardiac: RRR; no murmurs, rubs, or gallops, 1-2+ edema with chronic venous stasis Respiratory:  clear to auscultation bilaterally, normal work of breathing GI: soft, nontender, nondistended, + BS MS: no deformity or atrophy  Skin: warm and dry, no rash Neuro:  Alert and Oriented x 3, Strength and sensation are intact Psych: euthymic mood, full affect  Wt Readings from Last 3 Encounters:  12/06/18 188 lb (85.3 kg)  11/28/18 187 lb 11.2 oz (85.1 kg)  11/09/18 180 lb 8.9 oz (81.9 kg)      Studies/Labs Reviewed:   EKG:  EKG is ordered today.  The ekg ordered today demonstrates normal sinus rhythm at rate of 69 bpm, left bundle block.  No acute changes.  Recent Labs: 08/06/2018: NT-Pro BNP 1,604 11/28/2018: ALT 29; BUN 50; Creatinine 2.35; Hemoglobin 10.8; Platelet Count 110; Potassium 3.6; Sodium 143   Lipid Panel    Component Value Date/Time   CHOL 195 08/15/2018 1236   TRIG 90 08/15/2018 1236   HDL 81 08/15/2018 1236   CHOLHDL 2.4 08/15/2018 1236   CHOLHDL 2.6 12/11/2008 0130   VLDL 13 12/11/2008 0130   LDLCALC 96 08/15/2018 1236    Additional studies/ records that were reviewed today include:  As summarized above.    ASSESSMENT & PLAN:    1. Surgical clearance -Her activity at home limited due to morbid obesity and chronic issue.  Per [facility] activity status index she can get 3.3 METS of activity.  Recent stress test 03/2018 was low risk.  Currently patient denies any anginal or CHF symptoms. -Patient is actually feeling better since last office visit and has lost 2 pounds.  She is doing speech and physical therapy without any issue.vGiven past medical history and  based on ACC/AHA guidelines, [name] would be at acceptable risk  for the planned procedure without further cardiovascular testing.   I will route this recommendation to the requesting party via Epic fax function and remove from pre-op pool.  Please  call with questions. She takes Eliquis for DVT. Will defer to PCP.   2.  Chronic bilateral lower extremity edema -Multifactorial from diastolic dysfunction and venous stasis.  She has lost 2 pounds since last office visit.  She is feeling better.  No change in therapy.  3. Hypertensive heart disease - BP stable on current medications.       Medication Adjustments/Labs and Tests Ordered: Current medicines are reviewed at length with the patient today.  Concerns regarding medicines are outlined above.  Medication changes, Labs and Tests ordered today are listed in the Patient Instructions below. Patient Instructions  Medication Instructions:  Your physician recommends that you continue on your current medications as directed. Please refer to the Current Medication list given to you today.   If you need a refill on your cardiac medications before your next appointment, please call your pharmacy.   Lab work: None ordered  If you have labs (blood work) drawn today and your tests are completely normal, you will receive your results only by: [name] MyChart Message (if you have MyChart) OR . A paper copy in the mail If you have any lab test that is abnormal or we need to change your treatment, we will call you to review the results.  Testing/Procedures: None ordered  Follow-Up: Your physician recommends that you schedule a follow-up appointment in: WITH DR. VARANASI AS PLANNED   Any Other Special Instructions Will Be Listed Below (If Applicable).       Jarrett Soho, [place]  12/06/2018 3:57 PM    [facility] [place], [place], [place]  [address] Phone: [phone]; Fax: [phone]

## 2018-12-07 ENCOUNTER — Encounter: Payer: Self-pay | Admitting: Nurse Practitioner

## 2018-12-07 ENCOUNTER — Ambulatory Visit (INDEPENDENT_AMBULATORY_CARE_PROVIDER_SITE_OTHER): Payer: Medicare Other | Admitting: Nurse Practitioner

## 2018-12-07 VITALS — BP 132/80 | HR 60 | Temp 98.2°F | Ht 65.0 in | Wt 188.0 lb

## 2018-12-07 DIAGNOSIS — N184 Chronic kidney disease, stage 4 (severe): Secondary | ICD-10-CM | POA: Diagnosis not present

## 2018-12-07 DIAGNOSIS — R4701 Aphasia: Secondary | ICD-10-CM

## 2018-12-07 DIAGNOSIS — E1122 Type 2 diabetes mellitus with diabetic chronic kidney disease: Secondary | ICD-10-CM | POA: Diagnosis not present

## 2018-12-07 DIAGNOSIS — I129 Hypertensive chronic kidney disease with stage 1 through stage 4 chronic kidney disease, or unspecified chronic kidney disease: Secondary | ICD-10-CM | POA: Diagnosis not present

## 2018-12-07 DIAGNOSIS — S7011XD Contusion of right thigh, subsequent encounter: Secondary | ICD-10-CM | POA: Diagnosis not present

## 2018-12-07 DIAGNOSIS — C50311 Malignant neoplasm of lower-inner quadrant of right female breast: Secondary | ICD-10-CM | POA: Diagnosis not present

## 2018-12-07 DIAGNOSIS — Z794 Long term (current) use of insulin: Secondary | ICD-10-CM | POA: Diagnosis not present

## 2018-12-07 DIAGNOSIS — Z09 Encounter for follow-up examination after completed treatment for conditions other than malignant neoplasm: Secondary | ICD-10-CM | POA: Diagnosis not present

## 2018-12-07 DIAGNOSIS — I10 Essential (primary) hypertension: Secondary | ICD-10-CM

## 2018-12-07 DIAGNOSIS — Z170 Estrogen receptor positive status [ER+]: Secondary | ICD-10-CM

## 2018-12-07 MED ORDER — HUMALOG KWIKPEN 200 UNIT/ML ~~LOC~~ SOPN: 4.0000 [IU] | PEN_INJECTOR | Freq: Three times a day (TID) | SUBCUTANEOUS | 1 refills | Status: DC

## 2018-12-07 NOTE — Progress Notes (Addendum)
Subjective:     Patient ID: [name] , female    DOB: 05/31/1933 , 85 y.o.   MRN: [id number]   Chief Complaint  Patient presents with  . Hospitalization Follow-up    strain muscle in the groin area that was surrounded by a bleed out.    HPI  Here for hospital follow up - she had a strained muscle on right groin area.  Stopped warfarin at that time.  Started on Eloquis on 11/09/18 - when went to nursing home started the medication.  She is getting physical therapy, occupational and speech therapy because her speech worsened.  She is receiving from [facility].  She also has an Technical sales engineer. She is currently staying with her daughter [name].  She has also been seen by social services for an aid 10 hours per week.      Past Medical History:  Diagnosis Date  . Anemia   . Arthritis   . Cataract   . Chronic diastolic CHF (congestive heart failure) ([place])   . CKD (chronic kidney disease), stage III ([place])   . Clotting disorder ([place])   . Diabetes mellitus ([place])   . Dizziness and giddiness   . DVT (deep venous thrombosis) ([place])    a. On Coumadin for this.  . Essential hypertension   . LBBB (left bundle branch block)   . Mitral regurgitation    a. Mod MR by echo 2014.  . Muscular deconditioning   . Obesity   . SVT (supraventricular tachycardia) ([place])    a. In 2013 she had an EPS with ablation for SVT which did not eliminate the SVT completely. She also had bradycardia which limited medication. She was placed on amiodarone by Dr. Lovena Le.  . Thrombocytopenia ([place]) 11/21/2011     Family History  Problem Relation Age of Onset  . Stroke Mother   . Cancer Father        prostate  . Hypertension Daughter   . Heart attack Brother        x2  . Hypertension Brother   . Stroke Sister   . Hypertension Sister   . Breast cancer Maternal Aunt      Current Outpatient Medications:  .  ACCU-CHEK AVIVA PLUS test strip, USE AS DIRECTED TO CHECK BLOOD SUGAR QID, Disp: , Rfl:  11 .  acetaminophen (TYLENOL) 500 MG tablet, Take 1,000 mg by mouth every 6 (six) hours as needed., Disp: , Rfl:  .  amiodarone (PACERONE) 200 MG tablet, Take 1 tablet (200 mg total) by mouth daily., Disp: 90 tablet, Rfl: 2 .  anastrozole (ARIMIDEX) 1 MG tablet, Take 1 tablet (1 mg total) by mouth daily., Disp: 90 tablet, Rfl: 3 .  apixaban (ELIQUIS) 5 MG TABS tablet, Take 1 tablet (5 mg total) by mouth 2 (two) times daily., Disp: 60 tablet, Rfl: 0 .  atorvastatin (LIPITOR) 20 MG tablet, TAKE 1 TABLET BY MOUTH AT NIGHT, Disp: 90 tablet, Rfl: 0 .  BD PEN NEEDLE NANO U/F 32G X 4 MM MISC, Use as directed, Disp: 50 each, Rfl: 4 .  calcium carbonate (OS-CAL) 600 MG TABS, Take 600 mg by mouth daily with breakfast. , Disp: , Rfl:  .  carvedilol (COREG) 12.5 MG tablet, Take 1 tablet (12.5 mg total) by mouth 2 (two) times daily., Disp: 180 tablet, Rfl: 3 .  Cyanocobalamin (VITAMIN B 12 PO), Take 1 tablet daily by mouth., Disp: , Rfl:  .  diclofenac sodium (VOLTAREN) 1 % GEL,  Apply 2 g topically 4 (four) times daily., Disp: 100 g, Rfl: 1 .  empagliflozin (JARDIANCE) 10 MG TABS tablet, Take 10 mg by mouth daily., Disp: , Rfl:  .  ferrous gluconate (FERGON) 324 MG tablet, Take 325 mg by mouth daily with breakfast. , Disp: , Rfl:  .  furosemide (LASIX) 80 MG tablet, Take 1 tablet (80 mg total) by mouth 2 (two) times daily., Disp: 60 tablet, Rfl: 9 .  HUMALOG KWIKPEN 200 UNIT/ML SOPN, Inject 4-8 Units into the skin 3 (three) times daily. Per sliding scale, Disp: , Rfl: 3 .  LEVEMIR FLEXTOUCH 100 UNIT/ML Pen, Inject 10-16 Units into the skin daily. 16 units in the am and 10 units in the evening, Disp: , Rfl: 4 .  losartan (COZAAR) 100 MG tablet, TAKE 1/2 TABLET(50 MG) BY MOUTH DAILY, Disp: 45 tablet, Rfl: 2 .  magnesium oxide (MAG-OX) 400 MG tablet, Take 400 mg by mouth daily., Disp: , Rfl:  .  metFORMIN (GLUCOPHAGE) 500 MG tablet, Take 500 mg by mouth 2 (two) times daily with a meal., Disp: , Rfl:  .   omeprazole (PRILOSEC) 40 MG capsule, TAKE ONE CAPSULE BY MOUTH DAILY BEFORE A MEAL, Disp: 90 capsule, Rfl: 1 .  oxybutynin (DITROPAN) 5 MG tablet, Take 1 tablet (5 mg total) by mouth 2 (two) times daily., Disp: 180 tablet, Rfl: 1 .  potassium chloride (MICRO-K) 10 MEQ CR capsule, Take 4 tablets by mouth X's 1 day then go back to 2 tablets by mouth daily, Disp: 180 capsule, Rfl: 1 .  pregabalin (LYRICA) 75 MG capsule, Take 75 mg by mouth at bedtime., Disp: , Rfl:    Allergies  Allergen Reactions  . Aspirin Itching  . Penicillins Itching and Rash    DID THE REACTION INVOLVE: Swelling of the face/tongue/throat, SOB, or low BP? No Sudden or severe rash/hives, skin peeling, or the inside of the mouth or nose? Yes Did it require medical treatment? yes When did it last happen?more than 10 years If all above answers are "NO", may proceed with cephalosporin use.      Review of Systems  Constitutional: Negative.   Respiratory: Negative.  Negative for cough.   Cardiovascular: Negative.  Negative for chest pain, palpitations and leg swelling.  Neurological: Negative for dizziness and headaches.     Today's Vitals   12/07/18 1525  BP: 132/80  Pulse: 60  Temp: 98.2 F (36.8 C)  TempSrc: Oral  SpO2: 95%  Weight: 188 lb (85.3 kg)  Height: 5\' 5"  (1.651 m)  PainSc: 2   PainLoc: Hip   Body mass index is 31.28 kg/m.   Objective:  Physical Exam Vitals signs reviewed.  Constitutional:      Appearance: Normal appearance. She is obese.  Cardiovascular:     Rate and Rhythm: Normal rate and regular rhythm.     Pulses: Normal pulses.     Heart sounds: Normal heart sounds.  Pulmonary:     Effort: Pulmonary effort is normal. No respiratory distress.     Breath sounds: Normal breath sounds.  Musculoskeletal: Normal range of motion.     Comments: In a wheelchair  Skin:    General: Skin is warm and dry.  Neurological:     General: No focal deficit present.     Mental Status: She is  alert and oriented to person, place, and time.     Comments: Speech is slightly garbled at times  Psychiatric:        Mood and  Affect: Mood normal.        Behavior: Behavior normal.        Thought Content: Thought content normal.        Judgment: Judgment normal.         Assessment And Plan:     1. Type 2 diabetes mellitus with stage 4 chronic kidney disease, with long-term current use of insulin (HCC)  Chronic, controlled  Continue with current medications  Encouraged to limit intake of sugary foods and drinks  Encouraged to increase physical activity to 150 minutes per week - HUMALOG KWIKPEN 200 UNIT/ML SOPN; Inject 4-8 Units into the skin 3 (three) times daily. Per sliding scale  Dispense: 15 pen; Refill: 1 - CMP14 + Anion Gap - Hemoglobin A1c  2. Essential hypertension . B/P is controlled.  . CMP ordered to check renal function.  . The importance of regular exercise and dietary modification was stressed to the patient.  . Stressed importance of losing ten percent of her body weight to help with B/P control.  . The weight loss would help with decreasing cardiac and cancer risk as well.   3. Malignant neoplasm of lower-inner quadrant of right breast of female, estrogen receptor positive ([place])  She is being seen by Oncology and planning to have chemo with oral agents.  4. Hematoma of right iliopsoas muscle, subsequent encounter  Now stable, remained in long term care facility for several months TCM Performed.  Discharge summary was reviewed in full detail during the visit. Meds reconciled and compared to discharge meds. Medication list is updated and reviewed with the patient.  Greater than 50% face to face time was spent in counseling an coordination of care.  All questions were answered to the satisfaction of the patient.   - CBC no Diff  5. Aphasia  Continues to have slurred speech and is no going through OT and PT  .       Minette Brine, FNP

## 2018-12-08 LAB — CMP14 + ANION GAP
ALT: 17 IU/L (ref 0–32)
AST: 22 IU/L (ref 0–40)
Albumin/Globulin Ratio: 1.3 (ref 1.2–2.2)
Albumin: 4.1 g/dL (ref 3.6–4.6)
Alkaline Phosphatase: 79 IU/L (ref 39–117)
Anion Gap: 18 mmol/L (ref 10.0–18.0)
BUN/Creatinine Ratio: 31 — ABNORMAL HIGH (ref 12–28)
BUN: 61 mg/dL — ABNORMAL HIGH (ref 8–27)
Bilirubin Total: 0.3 mg/dL (ref 0.0–1.2)
CO2: 29 mmol/L (ref 20–29)
Calcium: 9.4 mg/dL (ref 8.7–10.3)
Chloride: 101 mmol/L (ref 96–106)
Creatinine, Ser: 1.98 mg/dL — ABNORMAL HIGH (ref 0.57–1.00)
GFR calc Af Amer: 26 mL/min/{1.73_m2} — ABNORMAL LOW (ref >59)
GFR calc non Af Amer: 23 mL/min/{1.73_m2} — ABNORMAL LOW (ref >59)
Globulin, Total: 3.2 g/dL (ref 1.5–4.5)
Glucose: 99 mg/dL (ref 65–99)
POTASSIUM: 4.1 mmol/L (ref 3.5–5.2)
SODIUM: 148 mmol/L — AB (ref 134–144)
Total Protein: 7.3 g/dL (ref 6.0–8.5)

## 2018-12-08 LAB — CBC
Hematocrit: 32 % — ABNORMAL LOW (ref 34.0–46.6)
Hemoglobin: 10.6 g/dL — ABNORMAL LOW (ref 11.1–15.9)
MCH: 30.1 pg (ref 26.6–33.0)
MCHC: 33.1 g/dL (ref 31.5–35.7)
MCV: 91 fL (ref 79–97)
Platelets: 96 10*3/uL — CL (ref 150–450)
RBC: 3.52 x10E6/uL — ABNORMAL LOW (ref 3.77–5.28)
RDW: 14 % (ref 11.7–15.4)
WBC: 3.6 10*3/uL (ref 3.4–10.8)

## 2018-12-08 LAB — HEMOGLOBIN A1C
Est. average glucose Bld gHb Est-mCnc: 177 mg/dL
Hgb A1c MFr Bld: 7.8 % — ABNORMAL HIGH (ref 4.8–5.6)

## 2018-12-10 DIAGNOSIS — I10 Essential (primary) hypertension: Secondary | ICD-10-CM | POA: Diagnosis not present

## 2018-12-10 DIAGNOSIS — M7981 Nontraumatic hematoma of soft tissue: Secondary | ICD-10-CM | POA: Diagnosis not present

## 2018-12-10 DIAGNOSIS — I5032 Chronic diastolic (congestive) heart failure: Secondary | ICD-10-CM | POA: Diagnosis not present

## 2018-12-10 DIAGNOSIS — E119 Type 2 diabetes mellitus without complications: Secondary | ICD-10-CM | POA: Diagnosis not present

## 2018-12-10 DIAGNOSIS — M160 Bilateral primary osteoarthritis of hip: Secondary | ICD-10-CM | POA: Diagnosis not present

## 2018-12-10 DIAGNOSIS — N183 Chronic kidney disease, stage 3 (moderate): Secondary | ICD-10-CM | POA: Diagnosis not present

## 2018-12-11 ENCOUNTER — Other Ambulatory Visit: Payer: Self-pay | Admitting: *Deleted

## 2018-12-11 DIAGNOSIS — I5032 Chronic diastolic (congestive) heart failure: Secondary | ICD-10-CM | POA: Diagnosis not present

## 2018-12-11 DIAGNOSIS — M7981 Nontraumatic hematoma of soft tissue: Secondary | ICD-10-CM | POA: Diagnosis not present

## 2018-12-11 DIAGNOSIS — N183 Chronic kidney disease, stage 3 (moderate): Secondary | ICD-10-CM | POA: Diagnosis not present

## 2018-12-11 DIAGNOSIS — E119 Type 2 diabetes mellitus without complications: Secondary | ICD-10-CM | POA: Diagnosis not present

## 2018-12-11 DIAGNOSIS — I10 Essential (primary) hypertension: Secondary | ICD-10-CM | POA: Diagnosis not present

## 2018-12-11 DIAGNOSIS — M160 Bilateral primary osteoarthritis of hip: Secondary | ICD-10-CM | POA: Diagnosis not present

## 2018-12-11 NOTE — Patient Outreach (Signed)
[place] [facility]) Care Management  12/11/2018  CHRISS REDEL 04-Apr-1933 [id number]    Transition of care   RN attempted outreach to pt based upon the requested cell contact number available however not available. RN able to leave a HIPAA approved voice message requesting a call back. Will update plan of care and further engage as needed with pt's ongoing management of care. Will reschedule another outreach call for services.  Raina Mina, RN Care Management Coordinator [place] Office [phone]

## 2018-12-12 DIAGNOSIS — E119 Type 2 diabetes mellitus without complications: Secondary | ICD-10-CM | POA: Diagnosis not present

## 2018-12-12 DIAGNOSIS — M7981 Nontraumatic hematoma of soft tissue: Secondary | ICD-10-CM | POA: Diagnosis not present

## 2018-12-12 DIAGNOSIS — M160 Bilateral primary osteoarthritis of hip: Secondary | ICD-10-CM | POA: Diagnosis not present

## 2018-12-12 DIAGNOSIS — I10 Essential (primary) hypertension: Secondary | ICD-10-CM | POA: Diagnosis not present

## 2018-12-12 DIAGNOSIS — I5032 Chronic diastolic (congestive) heart failure: Secondary | ICD-10-CM | POA: Diagnosis not present

## 2018-12-12 DIAGNOSIS — N183 Chronic kidney disease, stage 3 (moderate): Secondary | ICD-10-CM | POA: Diagnosis not present

## 2018-12-13 DIAGNOSIS — I5032 Chronic diastolic (congestive) heart failure: Secondary | ICD-10-CM | POA: Diagnosis not present

## 2018-12-13 DIAGNOSIS — I10 Essential (primary) hypertension: Secondary | ICD-10-CM | POA: Diagnosis not present

## 2018-12-13 DIAGNOSIS — E119 Type 2 diabetes mellitus without complications: Secondary | ICD-10-CM | POA: Diagnosis not present

## 2018-12-13 DIAGNOSIS — M7981 Nontraumatic hematoma of soft tissue: Secondary | ICD-10-CM | POA: Diagnosis not present

## 2018-12-13 DIAGNOSIS — N183 Chronic kidney disease, stage 3 (moderate): Secondary | ICD-10-CM | POA: Diagnosis not present

## 2018-12-13 DIAGNOSIS — M160 Bilateral primary osteoarthritis of hip: Secondary | ICD-10-CM | POA: Diagnosis not present

## 2018-12-14 DIAGNOSIS — M160 Bilateral primary osteoarthritis of hip: Secondary | ICD-10-CM | POA: Diagnosis not present

## 2018-12-14 DIAGNOSIS — I5032 Chronic diastolic (congestive) heart failure: Secondary | ICD-10-CM | POA: Diagnosis not present

## 2018-12-14 DIAGNOSIS — N183 Chronic kidney disease, stage 3 (moderate): Secondary | ICD-10-CM | POA: Diagnosis not present

## 2018-12-14 DIAGNOSIS — E119 Type 2 diabetes mellitus without complications: Secondary | ICD-10-CM | POA: Diagnosis not present

## 2018-12-14 DIAGNOSIS — M7981 Nontraumatic hematoma of soft tissue: Secondary | ICD-10-CM | POA: Diagnosis not present

## 2018-12-14 DIAGNOSIS — I10 Essential (primary) hypertension: Secondary | ICD-10-CM | POA: Diagnosis not present

## 2018-12-17 ENCOUNTER — Other Ambulatory Visit: Payer: Self-pay | Admitting: *Deleted

## 2018-12-17 DIAGNOSIS — I10 Essential (primary) hypertension: Secondary | ICD-10-CM | POA: Diagnosis not present

## 2018-12-17 DIAGNOSIS — N183 Chronic kidney disease, stage 3 (moderate): Secondary | ICD-10-CM | POA: Diagnosis not present

## 2018-12-17 DIAGNOSIS — E119 Type 2 diabetes mellitus without complications: Secondary | ICD-10-CM | POA: Diagnosis not present

## 2018-12-17 DIAGNOSIS — M160 Bilateral primary osteoarthritis of hip: Secondary | ICD-10-CM | POA: Diagnosis not present

## 2018-12-17 DIAGNOSIS — I5032 Chronic diastolic (congestive) heart failure: Secondary | ICD-10-CM | POA: Diagnosis not present

## 2018-12-17 DIAGNOSIS — M7981 Nontraumatic hematoma of soft tissue: Secondary | ICD-10-CM | POA: Diagnosis not present

## 2018-12-17 NOTE — Patient Outreach (Signed)
[place] [facility]) Care Management  12/17/2018  AVIELLE IMBERT [birth year] [id number]    RN spoke with pt today and received an update on her ongoing management of care. Pt reports she continue to work with [employer] agency for PT/OT/RN and SPEECH. States would be visiting today. Other prevention measures discussed was pt has an emergency alarm necklace to push button if needed. Pt also indicates she is in a handicap apartment with alarms in several places she can pull that would directly alert the Fire Department that she need help.  Pt also states she continue to work with the [employer] agency for PT/OT/RN and SPEECH therapy (all visited today).  RN verified pt continue to attend all medical appointments with the assistance from her daughter and grandchildren when available.Reports she is taking all her medications and will get her daughter [name]) to call in a few refills when she gets home. Pt states she has not ran out of medication and will obtain the needed refills for future reference. Plan of care discussed with ongoing management of care as noted above. Will reiterated on her plan of care, goals and interventions that will be adjusted according to pt's progress. Will continue to encouraged adherence with the follow case plan and follow up next week on pt's ongoing progress.   THN CM Care Plan Problem One     Most Recent Value  Care Plan Problem One  Knowledge deficit related to preventive measures post SNF discharge  Role Documenting the Problem One  Care Management [place] for Problem One  Active  THN Long Term Goal   Pt will actively pariticpate in preventive measures to avoid readmission over the next 60 days.  THN Long Term Goal Start Date  12/04/18  Interventions for Problem One Long Term Goal  Will continue to confirm no acute events have occurred and pt continue to actively participate in preventive measures to avoid the risk of admission. Will stress the  importance of continuing to work with [facility] (PT/OT/RN/SPEECH). Will also verify pt's use of her emergency alert necklace and when to use this device with acute symptoms or issues that may occur.   THN CM Short Term Goal #1   Adherence with all medical appointments post SNF discharge over the next 30 days.  THN CM Short Term Goal #1 Start Date  12/04/18  Interventions for Short Term Goal #1  Will verify pt's has not missed any appointments to avoid teh NO SHOW fees that pt is well aware of this expense if she does not call to reschedule or cancel in advance and scheduled appointments. Will stress the importance of  attending all appointments to avoid acute events and issues from occurring. WIll reiterated on needed appointments such as her eye examine. Will stress that pt request a referral for an eye provider of choice with her and to be receptive to the referral office contacting her for an appointment. Will follow up accordingly.  THN CM Short Term Goal #2   Adherence with post SNF discharge medications over the next 30 days.  THN CM Short Term Goal #2 Start Date  12/04/18  Interventions for Short Term Goal #2  Will verify pt continues to take all prescribed medictaions. Will confirm supplies and stress the importance of obtaining needed refills prior to running out of her existing medications.  Will follow up on the successful of obtaining her medications and taking as prescribed.     Raina Mina, RN Care Management Coordinator  [employer] trader [phone]

## 2018-12-19 DIAGNOSIS — M7981 Nontraumatic hematoma of soft tissue: Secondary | ICD-10-CM | POA: Diagnosis not present

## 2018-12-19 DIAGNOSIS — I5032 Chronic diastolic (congestive) heart failure: Secondary | ICD-10-CM | POA: Diagnosis not present

## 2018-12-19 DIAGNOSIS — N183 Chronic kidney disease, stage 3 (moderate): Secondary | ICD-10-CM | POA: Diagnosis not present

## 2018-12-19 DIAGNOSIS — M160 Bilateral primary osteoarthritis of hip: Secondary | ICD-10-CM | POA: Diagnosis not present

## 2018-12-19 DIAGNOSIS — I10 Essential (primary) hypertension: Secondary | ICD-10-CM | POA: Diagnosis not present

## 2018-12-19 DIAGNOSIS — E119 Type 2 diabetes mellitus without complications: Secondary | ICD-10-CM | POA: Diagnosis not present

## 2018-12-21 DIAGNOSIS — M7062 Trochanteric bursitis, left hip: Secondary | ICD-10-CM | POA: Diagnosis not present

## 2018-12-21 DIAGNOSIS — M1711 Unilateral primary osteoarthritis, right knee: Secondary | ICD-10-CM | POA: Diagnosis not present

## 2018-12-22 DIAGNOSIS — E119 Type 2 diabetes mellitus without complications: Secondary | ICD-10-CM | POA: Diagnosis not present

## 2018-12-22 DIAGNOSIS — I5032 Chronic diastolic (congestive) heart failure: Secondary | ICD-10-CM | POA: Diagnosis not present

## 2018-12-22 DIAGNOSIS — I10 Essential (primary) hypertension: Secondary | ICD-10-CM | POA: Diagnosis not present

## 2018-12-22 DIAGNOSIS — N183 Chronic kidney disease, stage 3 (moderate): Secondary | ICD-10-CM | POA: Diagnosis not present

## 2018-12-22 DIAGNOSIS — M7981 Nontraumatic hematoma of soft tissue: Secondary | ICD-10-CM | POA: Diagnosis not present

## 2018-12-22 DIAGNOSIS — M160 Bilateral primary osteoarthritis of hip: Secondary | ICD-10-CM | POA: Diagnosis not present

## 2018-12-24 ENCOUNTER — Other Ambulatory Visit: Payer: Self-pay | Admitting: *Deleted

## 2018-12-24 DIAGNOSIS — N183 Chronic kidney disease, stage 3 (moderate): Secondary | ICD-10-CM | POA: Diagnosis not present

## 2018-12-24 DIAGNOSIS — M160 Bilateral primary osteoarthritis of hip: Secondary | ICD-10-CM | POA: Diagnosis not present

## 2018-12-24 DIAGNOSIS — I5032 Chronic diastolic (congestive) heart failure: Secondary | ICD-10-CM | POA: Diagnosis not present

## 2018-12-24 DIAGNOSIS — E119 Type 2 diabetes mellitus without complications: Secondary | ICD-10-CM | POA: Diagnosis not present

## 2018-12-24 DIAGNOSIS — M7981 Nontraumatic hematoma of soft tissue: Secondary | ICD-10-CM | POA: Diagnosis not present

## 2018-12-24 DIAGNOSIS — I10 Essential (primary) hypertension: Secondary | ICD-10-CM | POA: Diagnosis not present

## 2018-12-24 NOTE — Patient Outreach (Signed)
[place] [facility]) Care Management  12/24/2018  [name] Mar 20, 1933 [id number]    RN spoke with pt today and inquired on her ongoing management of care. Pt reports along with her daughter [name]) in the room today that [facility] continues with PT/OT and Speech. States PT-M-Thurs-Fr and OT-Tues and Speech has 2 weeks left of services. Also states pt has aide services with [facility]. Decline any needs at this time other then a call that needs to be made to the pt's provider concerning her diabetic medication. States her CBGs are fluctuating more then usual due to a recent change in her medication. Daughter indicated she would call today with this request. Reports AM readings around 159-280. No other issues or events have occurred since last conversation.  Plan of care discussed along with goals and interventions that were adjusted according to pt's progress. RN inquired on any issues with getting to her medical appointments (denied) and indicated pt is pending an appointment Friday with her oncologist. Again will contact her provider concerning her diabetic medications to be adjusted based upon her recent readings. RN will continue to encourage pt to work with [facility] therapy to prevent once again any potential risk of readmission to hospital or SNF. No other needs presented at this time and pt remains agreeable to the current plan of care and discussed interventions today. Will follow up next week with ongoing transition of care contact.  THN CM Care Plan Problem One     Most Recent Value  Care Plan Problem One  Knowledge deficit related to preventive measures post SNF discharge  Role Documenting the Problem One  Care Management [place] for Problem One  Active  THN Long Term Goal   Pt will actively pariticpate in preventive measures to avoid readmission over the next 60 days.  THN Long Term Goal Start Date  12/04/18  Interventions for Problem One  Long Term Goal  Will verify and continue to encourage pt on her participation with [facility] for PT/OT/Speech services. Will continue to encouraged adherence with progress in these area to prevent risk of redmission.   THN CM Short Term Goal #1   Adherence with all medical appointments post SNF discharge over the next 30 days.  THN CM Short Term Goal #1 Start Date  12/04/18  Interventions for Short Term Goal #1  Will continue to extend due to pending appointments this month with her speciality provider. Will follow up on pt's adherence with this goal..  THN CM Short Term Goal #2   Adherence with post SNF discharge medications over the next 30 days.  THN CM Short Term Goal #2 Start Date  12/04/18  Interventions for Short Term Goal #2  Will extend to allow pt to communicate with her provider on possible change with medication to accommodate pt recent changesin her CBG readings.       Raina Mina, RN Care Management Coordinator [place] Office [phone]     .

## 2018-12-25 DIAGNOSIS — M7981 Nontraumatic hematoma of soft tissue: Secondary | ICD-10-CM | POA: Diagnosis not present

## 2018-12-25 DIAGNOSIS — I5032 Chronic diastolic (congestive) heart failure: Secondary | ICD-10-CM | POA: Diagnosis not present

## 2018-12-25 DIAGNOSIS — I10 Essential (primary) hypertension: Secondary | ICD-10-CM | POA: Diagnosis not present

## 2018-12-25 DIAGNOSIS — M160 Bilateral primary osteoarthritis of hip: Secondary | ICD-10-CM | POA: Diagnosis not present

## 2018-12-25 DIAGNOSIS — N183 Chronic kidney disease, stage 3 (moderate): Secondary | ICD-10-CM | POA: Diagnosis not present

## 2018-12-25 DIAGNOSIS — E119 Type 2 diabetes mellitus without complications: Secondary | ICD-10-CM | POA: Diagnosis not present

## 2018-12-26 ENCOUNTER — Telehealth: Payer: Self-pay

## 2018-12-26 DIAGNOSIS — N183 Chronic kidney disease, stage 3 (moderate): Secondary | ICD-10-CM | POA: Diagnosis not present

## 2018-12-26 DIAGNOSIS — I10 Essential (primary) hypertension: Secondary | ICD-10-CM | POA: Diagnosis not present

## 2018-12-26 DIAGNOSIS — M7981 Nontraumatic hematoma of soft tissue: Secondary | ICD-10-CM | POA: Diagnosis not present

## 2018-12-26 DIAGNOSIS — I5032 Chronic diastolic (congestive) heart failure: Secondary | ICD-10-CM | POA: Diagnosis not present

## 2018-12-26 DIAGNOSIS — M160 Bilateral primary osteoarthritis of hip: Secondary | ICD-10-CM | POA: Diagnosis not present

## 2018-12-26 DIAGNOSIS — E119 Type 2 diabetes mellitus without complications: Secondary | ICD-10-CM | POA: Diagnosis not present

## 2018-12-26 NOTE — Telephone Encounter (Signed)
Pt  daughter called  Requested a refill a refill of novolog to be sent to [facility] , per patient  Chart [facility]og was [facility] to 12/07/2018. L/m on pt's daughter Ms.Smalls about rx being at the pharmacy. Pt 's daughter stated that her blood sugar  Have been running high.

## 2018-12-27 DIAGNOSIS — I10 Essential (primary) hypertension: Secondary | ICD-10-CM | POA: Diagnosis not present

## 2018-12-27 DIAGNOSIS — M160 Bilateral primary osteoarthritis of hip: Secondary | ICD-10-CM | POA: Diagnosis not present

## 2018-12-27 DIAGNOSIS — M7981 Nontraumatic hematoma of soft tissue: Secondary | ICD-10-CM | POA: Diagnosis not present

## 2018-12-27 DIAGNOSIS — N183 Chronic kidney disease, stage 3 (moderate): Secondary | ICD-10-CM | POA: Diagnosis not present

## 2018-12-27 DIAGNOSIS — I5032 Chronic diastolic (congestive) heart failure: Secondary | ICD-10-CM | POA: Diagnosis not present

## 2018-12-27 DIAGNOSIS — E119 Type 2 diabetes mellitus without complications: Secondary | ICD-10-CM | POA: Diagnosis not present

## 2018-12-28 ENCOUNTER — Ambulatory Visit: Payer: Self-pay | Admitting: General Surgery

## 2018-12-28 DIAGNOSIS — C50911 Malignant neoplasm of unspecified site of right female breast: Secondary | ICD-10-CM | POA: Diagnosis not present

## 2018-12-31 ENCOUNTER — Ambulatory Visit: Payer: Medicare Other | Admitting: *Deleted

## 2018-12-31 DIAGNOSIS — M160 Bilateral primary osteoarthritis of hip: Secondary | ICD-10-CM | POA: Diagnosis not present

## 2018-12-31 DIAGNOSIS — I5032 Chronic diastolic (congestive) heart failure: Secondary | ICD-10-CM | POA: Diagnosis not present

## 2018-12-31 DIAGNOSIS — E119 Type 2 diabetes mellitus without complications: Secondary | ICD-10-CM | POA: Diagnosis not present

## 2018-12-31 DIAGNOSIS — M7981 Nontraumatic hematoma of soft tissue: Secondary | ICD-10-CM | POA: Diagnosis not present

## 2018-12-31 DIAGNOSIS — I10 Essential (primary) hypertension: Secondary | ICD-10-CM | POA: Diagnosis not present

## 2018-12-31 DIAGNOSIS — N183 Chronic kidney disease, stage 3 (moderate): Secondary | ICD-10-CM | POA: Diagnosis not present

## 2019-01-01 ENCOUNTER — Other Ambulatory Visit: Payer: Self-pay

## 2019-01-01 ENCOUNTER — Encounter (HOSPITAL_COMMUNITY): Payer: Self-pay

## 2019-01-01 ENCOUNTER — Inpatient Hospital Stay (HOSPITAL_COMMUNITY)
Admission: EM | Admit: 2019-01-01 | Discharge: 2019-01-07 | DRG: 439 | Disposition: A | Discharge status: Skilled Nursing Facility | Payer: Medicare Other | Attending: Internal Medicine | Admitting: Internal Medicine

## 2019-01-01 DIAGNOSIS — I10 Essential (primary) hypertension: Secondary | ICD-10-CM | POA: Diagnosis not present

## 2019-01-01 DIAGNOSIS — I130 Hypertensive heart and chronic kidney disease with heart failure and stage 1 through stage 4 chronic kidney disease, or unspecified chronic kidney disease: Secondary | ICD-10-CM | POA: Diagnosis present

## 2019-01-01 DIAGNOSIS — K5909 Other constipation: Secondary | ICD-10-CM | POA: Diagnosis not present

## 2019-01-01 DIAGNOSIS — Z803 Family history of malignant neoplasm of breast: Secondary | ICD-10-CM | POA: Diagnosis not present

## 2019-01-01 DIAGNOSIS — I5032 Chronic diastolic (congestive) heart failure: Secondary | ICD-10-CM | POA: Diagnosis not present

## 2019-01-01 DIAGNOSIS — E1122 Type 2 diabetes mellitus with diabetic chronic kidney disease: Secondary | ICD-10-CM | POA: Diagnosis present

## 2019-01-01 DIAGNOSIS — R739 Hyperglycemia, unspecified: Secondary | ICD-10-CM | POA: Diagnosis not present

## 2019-01-01 DIAGNOSIS — N183 Chronic kidney disease, stage 3 (moderate): Secondary | ICD-10-CM | POA: Diagnosis not present

## 2019-01-01 DIAGNOSIS — Z823 Family history of stroke: Secondary | ICD-10-CM

## 2019-01-01 DIAGNOSIS — D696 Thrombocytopenia, unspecified: Secondary | ICD-10-CM | POA: Diagnosis present

## 2019-01-01 DIAGNOSIS — E1165 Type 2 diabetes mellitus with hyperglycemia: Secondary | ICD-10-CM | POA: Diagnosis present

## 2019-01-01 DIAGNOSIS — Z880 Allergy status to penicillin: Secondary | ICD-10-CM | POA: Diagnosis not present

## 2019-01-01 DIAGNOSIS — I272 Pulmonary hypertension, unspecified: Secondary | ICD-10-CM | POA: Diagnosis present

## 2019-01-01 DIAGNOSIS — K8689 Other specified diseases of pancreas: Secondary | ICD-10-CM | POA: Diagnosis not present

## 2019-01-01 DIAGNOSIS — I340 Nonrheumatic mitral (valve) insufficiency: Secondary | ICD-10-CM | POA: Diagnosis present

## 2019-01-01 DIAGNOSIS — Z794 Long term (current) use of insulin: Principal | ICD-10-CM

## 2019-01-01 DIAGNOSIS — K859 Acute pancreatitis without necrosis or infection, unspecified: Secondary | ICD-10-CM | POA: Diagnosis not present

## 2019-01-01 DIAGNOSIS — M1990 Unspecified osteoarthritis, unspecified site: Secondary | ICD-10-CM | POA: Diagnosis present

## 2019-01-01 DIAGNOSIS — Z8249 Family history of ischemic heart disease and other diseases of the circulatory system: Secondary | ICD-10-CM

## 2019-01-01 DIAGNOSIS — M48061 Spinal stenosis, lumbar region without neurogenic claudication: Secondary | ICD-10-CM | POA: Diagnosis not present

## 2019-01-01 DIAGNOSIS — N3000 Acute cystitis without hematuria: Secondary | ICD-10-CM | POA: Diagnosis not present

## 2019-01-01 DIAGNOSIS — Z87442 Personal history of urinary calculi: Secondary | ICD-10-CM

## 2019-01-01 DIAGNOSIS — Z853 Personal history of malignant neoplasm of breast: Secondary | ICD-10-CM

## 2019-01-01 DIAGNOSIS — N184 Chronic kidney disease, stage 4 (severe): Principal | ICD-10-CM

## 2019-01-01 DIAGNOSIS — Z886 Allergy status to analgesic agent status: Secondary | ICD-10-CM

## 2019-01-01 DIAGNOSIS — L8990 Pressure ulcer of unspecified site, unspecified stage: Secondary | ICD-10-CM

## 2019-01-01 DIAGNOSIS — E119 Type 2 diabetes mellitus without complications: Secondary | ICD-10-CM | POA: Diagnosis not present

## 2019-01-01 DIAGNOSIS — K5900 Constipation, unspecified: Secondary | ICD-10-CM | POA: Diagnosis not present

## 2019-01-01 DIAGNOSIS — K8500 Idiopathic acute pancreatitis without necrosis or infection: Secondary | ICD-10-CM

## 2019-01-01 DIAGNOSIS — E08649 Diabetes mellitus due to underlying condition with hypoglycemia without coma: Secondary | ICD-10-CM | POA: Diagnosis not present

## 2019-01-01 DIAGNOSIS — Z86718 Personal history of other venous thrombosis and embolism: Secondary | ICD-10-CM

## 2019-01-01 DIAGNOSIS — I82499 Acute embolism and thrombosis of other specified deep vein of unspecified lower extremity: Secondary | ICD-10-CM | POA: Diagnosis not present

## 2019-01-01 DIAGNOSIS — Z7901 Long term (current) use of anticoagulants: Secondary | ICD-10-CM

## 2019-01-01 DIAGNOSIS — I471 Supraventricular tachycardia: Secondary | ICD-10-CM | POA: Diagnosis not present

## 2019-01-01 DIAGNOSIS — M160 Bilateral primary osteoarthritis of hip: Secondary | ICD-10-CM | POA: Diagnosis not present

## 2019-01-01 DIAGNOSIS — I447 Left bundle-branch block, unspecified: Secondary | ICD-10-CM | POA: Diagnosis present

## 2019-01-01 DIAGNOSIS — E876 Hypokalemia: Secondary | ICD-10-CM | POA: Diagnosis not present

## 2019-01-01 DIAGNOSIS — M7981 Nontraumatic hematoma of soft tissue: Secondary | ICD-10-CM | POA: Diagnosis not present

## 2019-01-01 HISTORY — DX: Malignant (primary) neoplasm, unspecified: C80.1

## 2019-01-01 LAB — CBC
HCT: 33.7 % — ABNORMAL LOW (ref 36.0–46.0)
Hemoglobin: 10.6 g/dL — ABNORMAL LOW (ref 12.0–15.0)
MCH: 29.8 pg (ref 26.0–34.0)
MCHC: 31.5 g/dL (ref 30.0–36.0)
MCV: 94.7 fL (ref 80.0–100.0)
Platelets: 122 10*3/uL — ABNORMAL LOW (ref 150–400)
RBC: 3.56 MIL/uL — ABNORMAL LOW (ref 3.87–5.11)
RDW: 14 % (ref 11.5–15.5)
WBC: 5.4 10*3/uL (ref 4.0–10.5)
nRBC: 0 % (ref 0.0–0.2)

## 2019-01-01 LAB — COMPREHENSIVE METABOLIC PANEL
ALK PHOS: 83 U/L (ref 38–126)
ALT: 26 U/L (ref 0–44)
AST: 24 U/L (ref 15–41)
Albumin: 3.4 g/dL — ABNORMAL LOW (ref 3.5–5.0)
Anion gap: 10 (ref 5–15)
BUN: 25 mg/dL — ABNORMAL HIGH (ref 8–23)
CO2: 28 mmol/L (ref 22–32)
CREATININE: 1.46 mg/dL — AB (ref 0.44–1.00)
Calcium: 8.8 mg/dL — ABNORMAL LOW (ref 8.9–10.3)
Chloride: 97 mmol/L — ABNORMAL LOW (ref 98–111)
GFR calc Af Amer: 38 mL/min — ABNORMAL LOW (ref >60)
GFR, EST NON AFRICAN AMERICAN: 33 mL/min — AB (ref >60)
Glucose, Bld: 443 mg/dL — ABNORMAL HIGH (ref 70–99)
Potassium: 4.3 mmol/L (ref 3.5–5.1)
Sodium: 135 mmol/L (ref 135–145)
Total Bilirubin: 0.7 mg/dL (ref 0.3–1.2)
Total Protein: 7.6 g/dL (ref 6.5–8.1)

## 2019-01-01 LAB — LIPASE, BLOOD: Lipase: 80 U/L — ABNORMAL HIGH (ref 11–51)

## 2019-01-01 MED ORDER — HUMALOG KWIKPEN 200 UNIT/ML ~~LOC~~ SOPN: 4.0000 [IU] | PEN_INJECTOR | Freq: Three times a day (TID) | SUBCUTANEOUS | 1 refills | Status: DC

## 2019-01-01 MED ORDER — IOHEXOL 300 MG/ML  SOLN
30.0000 mL | Freq: Once | INTRAMUSCULAR | Status: AC | PRN
  Administered 2019-01-01: 30 mL via ORAL

## 2019-01-01 MED ORDER — SODIUM CHLORIDE 0.9 % IV BOLUS (SEPSIS)
500.0000 mL | Freq: Once | INTRAVENOUS | Status: AC
  Administered 2019-01-01: 500 mL via INTRAVENOUS

## 2019-01-01 MED ORDER — SODIUM CHLORIDE 0.9% FLUSH
3.0000 mL | Freq: Once | INTRAVENOUS | Status: AC
  Administered 2019-01-01: 3 mL via INTRAVENOUS

## 2019-01-01 MED ORDER — SODIUM CHLORIDE 0.9 % IV SOLN
1000.0000 mL | INTRAVENOUS | Status: DC
  Administered 2019-01-01: 1000 mL via INTRAVENOUS

## 2019-01-01 NOTE — ED Provider Notes (Signed)
[facility] DEPT Provider Note   CSN: [id number] Arrival date & time: 01/01/19  1654    History   Chief Complaint Chief Complaint  Patient presents with  . Abdominal Pain  . Constipation    HPI [name] is a 85 y.o. female.   HPI Patient presents to the emergency room for evaluation of abdominal pain and possible constipation.  Patient started having pain and discomfort in her abdomen couple of days ago.  He has not had a bowel movement for the last 4 days.  Her daughter who is a Pharmacologist attempted treating her with laxatives including MiraLAX.  Patient still has not had a bowel movement in she now feels like she is getting bloated and is having more pain.  No nausea vomiting.  No fevers. Past Medical History:  Diagnosis Date  . Anemia   . Arthritis   . Cancer ([place])   . Cataract   . Chronic diastolic CHF (congestive heart failure) ([place])   . CKD (chronic kidney disease), stage III ([place])   . Clotting disorder ([place])   . Diabetes mellitus ([place])   . Dizziness and giddiness   . DVT (deep venous thrombosis) ([place])    a. On Coumadin for this.  . Essential hypertension   . LBBB (left bundle branch block)   . Mitral regurgitation    a. Mod MR by echo 2014.  . Muscular deconditioning   . Obesity   . SVT (supraventricular tachycardia) ([place])    a. In 2013 she had an EPS with ablation for SVT which did not eliminate the SVT completely. She also had bradycardia which limited medication. She was placed on amiodarone by Dr. Lovena Le.  . Thrombocytopenia ([place]) 11/21/2011    Patient Active Problem List   Diagnosis Date Noted  . Malignant neoplasm of lower-inner quadrant of right breast of female, estrogen receptor positive ([place]) 11/23/2018  . Hematoma of right iliopsoas muscle 11/06/2018  . Intramuscular hematoma 11/06/2018  . Hypertensive heart disease with heart failure ([place]) 09/05/2017  . Aphasia 08/31/2017  . Dysphagia 08/31/2017   . CKD (chronic kidney disease), stage III ([place])   . LBBB (left bundle branch block)   . Anemia   . HTN (hypertension)   . Obesity   . DVT (deep venous thrombosis) ([place])   . DM (diabetes mellitus) ([place]) 07/28/2015  . Fall 07/28/2015  . Warfarin-induced coagulopathy ([place]) 07/28/2015  . Chronic diastolic heart failure ([place]) 03/19/2014  . Edema 08/06/2013  . SVT (supraventricular tachycardia) ([place]) 10/15/2012  . First degree AV block 10/15/2012  . Acute renal insufficiency 10/14/2012  . Dizziness and giddiness   . Thrombocytopenia ([place]) 11/21/2011    Past Surgical History:  Procedure Laterality Date  . EYE SURGERY    . SUPRAVENTRICULAR TACHYCARDIA ABLATION N/A 10/11/2012   Procedure: SUPRAVENTRICULAR TACHYCARDIA ABLATION;  Surgeon: Evans Lance, MD;  Location: [facility] CATH LAB;  Service: Cardiovascular;  Laterality: N/A;     OB History   No obstetric history on file.      Home Medications    Prior to Admission medications   Medication Sig Start Date End Date Taking? Authorizing Provider  acetaminophen (TYLENOL) 500 MG tablet Take 1,000 mg by mouth every 6 (six) hours as needed.   Yes [provider]  amiodarone (PACERONE) 200 MG tablet Take 1 tablet (200 mg total) by mouth daily. 10/05/18  Yes Jettie Booze, MD  anastrozole (ARIMIDEX) 1 MG tablet Take 1 tablet (1 mg total)  by mouth daily. 11/28/18  Yes Nicholas Lose, MD  apixaban (ELIQUIS) 5 MG TABS tablet Take 1 tablet (5 mg total) by mouth 2 (two) times daily. 11/09/18  Yes Purohit, Konrad Dolores, MD  atorvastatin (LIPITOR) 20 MG tablet TAKE 1 TABLET BY MOUTH AT NIGHT Patient taking differently: Take 20 mg by mouth daily at 6 PM.  10/04/18  Yes [name], MD  calcium carbonate (OS-CAL) 600 MG TABS Take 600 mg by mouth daily with breakfast.    Yes [provider]  carvedilol (COREG) 12.5 MG tablet Take 1 tablet (12.5 mg total) by mouth 2 (two) times daily. 09/14/18 01/01/19 Yes Lyda Jester M, PA-C   diclofenac sodium (VOLTAREN) 1 % GEL Apply 2 g topically 4 (four) times daily. 10/30/18  Yes Rodriguez-Southworth, Sunday Spillers, PA-C  furosemide (LASIX) 80 MG tablet Take 1 tablet (80 mg total) by mouth 2 (two) times daily. 07/09/18  Yes Bhagat, Bhavinkumar, PA  HUMALOG KWIKPEN 200 UNIT/ML SOPN Inject 4-8 Units into the skin 3 (three) times daily. Per sliding scale 01/01/19  Yes [name], MD  LEVEMIR FLEXTOUCH 100 UNIT/ML Pen Inject 10-16 Units into the skin daily. 16 units in the am and 10 units in the evening 01/19/16  Yes [provider]  losartan (COZAAR) 100 MG tablet TAKE 1/2 TABLET(50 MG) BY MOUTH DAILY Patient taking differently: Take 50 mg by mouth daily.  02/06/18  Yes Jettie Booze, MD  magnesium oxide (MAG-OX) 400 MG tablet Take 400 mg by mouth daily.   Yes [provider]  metoprolol succinate (TOPROL-XL) 50 MG 24 hr tablet Take 50 mg by mouth daily. 12/28/18  Yes [provider]  omeprazole (PRILOSEC) 40 MG capsule TAKE ONE CAPSULE BY MOUTH DAILY BEFORE A MEAL Patient taking differently: Take 40 mg by mouth daily.  10/25/18  Yes [name], MD  oxybutynin (DITROPAN) 5 MG tablet Take 1 tablet (5 mg total) by mouth 2 (two) times daily. 10/16/18  Yes [name], MD  potassium chloride (MICRO-K) 10 MEQ CR capsule Take 4 tablets by mouth X's 1 day then go back to 2 tablets by mouth daily 01/20/16  Yes Rosita Fire, Brittainy M, PA-C  pregabalin (LYRICA) 75 MG capsule Take 75 mg by mouth at bedtime.   Yes [provider]  traMADol (ULTRAM) 50 MG tablet Take 50 mg by mouth 2 (two) times daily as needed for pain. 12/21/18  Yes [provider]  Sac test strip USE AS DIRECTED TO CHECK BLOOD SUGAR QID 02/03/16   [provider]  BD PEN NEEDLE NANO U/F 32G X 4 MM MISC Use as directed 10/16/18   [name], MD    Family History Family History  Problem Relation Age of Onset  . Stroke Mother   . Cancer Father         prostate  . Hypertension Daughter   . Heart attack Brother        x2  . Hypertension Brother   . Stroke Sister   . Hypertension Sister   . Breast cancer Maternal Aunt     Social History Social History   Tobacco Use  . Smoking status: Never Smoker  . Smokeless tobacco: Never Used  Substance Use Topics  . Alcohol use: No  . Drug use: No     Allergies   Aspirin and Penicillins   Review of Systems Review of Systems  All other systems reviewed and are negative.    Physical Exam Updated Vital Signs BP [name])  166/74   Pulse 73   Temp 99.2 F (37.3 C) (Oral)   Resp 16   Ht 1.651 m (5\' 5" )   Wt 89.4 kg   SpO2 100%   BMI 32.78 kg/m   Physical Exam Vitals signs and nursing note reviewed.  Constitutional:      Appearance: She is not toxic-appearing or diaphoretic.  HENT:     Head: Normocephalic and atraumatic.     Right Ear: External ear normal.     Left Ear: External ear normal.  Eyes:     General: No scleral icterus.       Right eye: No discharge.        Left eye: No discharge.     Conjunctiva/sclera: Conjunctivae normal.  Neck:     Musculoskeletal: Neck supple.     Trachea: No tracheal deviation.  Cardiovascular:     Rate and Rhythm: Normal rate and regular rhythm.  Pulmonary:     Effort: Pulmonary effort is normal. No respiratory distress.     Breath sounds: Normal breath sounds. No stridor. No wheezing or rales.  Abdominal:     General: Bowel sounds are normal. There is no distension.     Palpations: Abdomen is soft. There is no mass.     Tenderness: There is generalized abdominal tenderness. There is no guarding or rebound.     Hernia: No hernia is present.  Genitourinary:    Comments: Rectal exam: No stool in the rectal vault Musculoskeletal:        General: No tenderness.  Skin:    General: Skin is warm and dry.     Findings: No rash.  Neurological:     Mental Status: She is alert.     Cranial Nerves: No cranial nerve deficit (no facial droop,  extraocular movements intact, no slurred speech).     Sensory: No sensory deficit.     Motor: No abnormal muscle tone or seizure activity.     Coordination: Coordination normal.      ED Treatments / Results  Labs (all labs ordered are listed, but only abnormal results are displayed) Labs Reviewed  LIPASE, BLOOD - Abnormal; Notable for the following components:      Result Value   Lipase 80 (*)    All other components within normal limits  COMPREHENSIVE METABOLIC PANEL - Abnormal; Notable for the following components:   Chloride 97 (*)    Glucose, Bld 443 (*)    BUN 25 (*)    Creatinine, Ser 1.46 (*)    Calcium 8.8 (*)    Albumin 3.4 (*)    GFR calc non Af Amer 33 (*)    GFR calc Af Amer 38 (*)    All other components within normal limits  CBC - Abnormal; Notable for the following components:   RBC 3.56 (*)    Hemoglobin 10.6 (*)    HCT 33.7 (*)    Platelets 122 (*)    All other components within normal limits  URINALYSIS, ROUTINE W REFLEX MICROSCOPIC - Abnormal; Notable for the following components:   APPearance HAZY (*)    Glucose, UA >=500 (*)    Hgb urine dipstick SMALL (*)    Ketones, ur 5 (*)    Leukocytes,Ua LARGE (*)    WBC, UA >50 (*)    Bacteria, UA RARE (*)    All other components within normal limits     Procedures Procedures (including critical care time)  Medications Ordered in ED Medications  sodium  chloride 0.9 % bolus 500 mL (0 mLs Intravenous Stopped 01/01/19 2357)    Followed by  0.9 %  sodium chloride infusion (1,000 mLs Intravenous New Bag/Given 01/01/19 2358)  cefTRIAXone (ROCEPHIN) 1 g in sodium chloride 0.9 % 100 mL IVPB (has no administration in time range)  sodium chloride flush (NS) 0.9 % injection 3 mL (3 mLs Intravenous Given 01/01/19 2238)  iohexol (OMNIPAQUE) 300 MG/ML solution 30 mL (30 mLs Oral Contrast Given 01/01/19 2207)     Initial Impression / Assessment and Plan / ED Course  I have reviewed the triage vital signs and the  nursing notes.  Pertinent labs & imaging results that were available during my care of the patient were reviewed by me and considered in my medical decision making (see chart for details).   Patient presented to the emergency room for evaluation of abdominal pain possible constipation.  Laboratory tests are notable for hyperglycemia as well as urinalysis suggestive of urinary tract infection.  Patient also has a slight elevation of the lipase.  Rectal exam did not show any evidence of fecal impaction.  CT scan was ordered to evaluate for possible obstruction, diverticulitis or colitis.  Will turn over to Dr Leonides Schanz  Final Clinical Impressions(s) / ED Diagnoses   Final diagnoses:  Acute cystitis without hematuria      Dorie Rank, MD 01/02/19 7121

## 2019-01-01 NOTE — ED Triage Notes (Signed)
Patient c/o mid abdominal pain and nausea.  No BM x 4 days. Patient has had Miralax, hot tea, and fleets enema with no results. Abdomen distended.

## 2019-01-02 ENCOUNTER — Emergency Department (HOSPITAL_COMMUNITY): Payer: Medicare Other

## 2019-01-02 ENCOUNTER — Encounter: Payer: Self-pay | Admitting: Nurse Practitioner

## 2019-01-02 DIAGNOSIS — K5909 Other constipation: Secondary | ICD-10-CM | POA: Diagnosis not present

## 2019-01-02 DIAGNOSIS — K8500 Idiopathic acute pancreatitis without necrosis or infection: Secondary | ICD-10-CM | POA: Diagnosis not present

## 2019-01-02 DIAGNOSIS — I82499 Acute embolism and thrombosis of other specified deep vein of unspecified lower extremity: Secondary | ICD-10-CM

## 2019-01-02 DIAGNOSIS — I471 Supraventricular tachycardia: Secondary | ICD-10-CM

## 2019-01-02 DIAGNOSIS — N183 Chronic kidney disease, stage 3 (moderate): Secondary | ICD-10-CM | POA: Diagnosis not present

## 2019-01-02 DIAGNOSIS — K859 Acute pancreatitis without necrosis or infection, unspecified: Secondary | ICD-10-CM | POA: Diagnosis present

## 2019-01-02 LAB — GLUCOSE, CAPILLARY
Glucose-Capillary: 152 mg/dL — ABNORMAL HIGH (ref 70–99)
Glucose-Capillary: 117 mg/dL — ABNORMAL HIGH (ref 70–99)
Glucose-Capillary: 161 mg/dL — ABNORMAL HIGH (ref 70–99)
Glucose-Capillary: 142 mg/dL — ABNORMAL HIGH (ref 70–99)
Glucose-Capillary: 100 mg/dL — ABNORMAL HIGH (ref 70–99)

## 2019-01-02 LAB — URINALYSIS, ROUTINE W REFLEX MICROSCOPIC
BILIRUBIN URINE: NEGATIVE
Glucose, UA: >=500 mg/dL — AB
Ketones, ur: 5 mg/dL — AB
Nitrite: NEGATIVE
Protein, ur: NEGATIVE mg/dL
Specific Gravity, Urine: 1.009 (ref 1.005–1.030)
WBC, UA: >50 WBC/hpf — ABNORMAL HIGH (ref 0–5)
pH: 6 (ref 5.0–8.0)

## 2019-01-02 LAB — TRIGLYCERIDES: Triglycerides: 56 mg/dL (ref <150)

## 2019-01-02 LAB — CBG MONITORING, ED: Glucose-Capillary: 344 mg/dL — ABNORMAL HIGH (ref 70–99)

## 2019-01-02 LAB — HEMOGLOBIN A1C
Hgb A1c MFr Bld: 8.4 % — ABNORMAL HIGH (ref 4.8–5.6)
Mean Plasma Glucose: 194.38 mg/dL

## 2019-01-02 MED ORDER — LACTATED RINGERS IV SOLN
INTRAVENOUS | Status: DC
  Administered 2019-01-02 – 2019-01-05 (×6): via INTRAVENOUS

## 2019-01-02 MED ORDER — AMIODARONE HCL 200 MG PO TABS
200.0000 mg | ORAL_TABLET | Freq: Every day | ORAL | Status: DC
  Administered 2019-01-02 – 2019-01-07 (×6): 200 mg via ORAL
  Filled 2019-01-02 (×6): qty 1

## 2019-01-02 MED ORDER — APIXABAN 5 MG PO TABS
5.0000 mg | ORAL_TABLET | Freq: Two times a day (BID) | ORAL | Status: DC
  Administered 2019-01-02 – 2019-01-07 (×11): 5 mg via ORAL
  Filled 2019-01-02 (×11): qty 1

## 2019-01-02 MED ORDER — TRAMADOL HCL 50 MG PO TABS
50.0000 mg | ORAL_TABLET | Freq: Two times a day (BID) | ORAL | Status: DC | PRN
  Administered 2019-01-03: 50 mg via ORAL
  Filled 2019-01-02: qty 1

## 2019-01-02 MED ORDER — POTASSIUM CHLORIDE CRYS ER 20 MEQ PO TBCR
20.0000 meq | EXTENDED_RELEASE_TABLET | Freq: Every day | ORAL | Status: DC
  Administered 2019-01-02 – 2019-01-04 (×3): 20 meq via ORAL
  Filled 2019-01-02 (×3): qty 1

## 2019-01-02 MED ORDER — INSULIN ASPART 100 UNIT/ML ~~LOC~~ SOLN: 5.0000 [IU] | Freq: Once | SUBCUTANEOUS | Status: DC

## 2019-01-02 MED ORDER — CARVEDILOL 12.5 MG PO TABS
12.5000 mg | ORAL_TABLET | Freq: Two times a day (BID) | ORAL | Status: DC
  Administered 2019-01-02 – 2019-01-07 (×11): 12.5 mg via ORAL
  Filled 2019-01-02 (×12): qty 1

## 2019-01-02 MED ORDER — INSULIN DETEMIR 100 UNIT/ML ~~LOC~~ SOLN
10.0000 [IU] | Freq: Two times a day (BID) | SUBCUTANEOUS | Status: DC
  Administered 2019-01-02: 10 [IU] via SUBCUTANEOUS
  Filled 2019-01-02 (×2): qty 0.1

## 2019-01-02 MED ORDER — INSULIN ASPART 100 UNIT/ML ~~LOC~~ SOLN
0.0000 [IU] | Freq: Three times a day (TID) | SUBCUTANEOUS | Status: DC
  Administered 2019-01-02: 1 [IU] via SUBCUTANEOUS
  Administered 2019-01-02: 2 [IU] via SUBCUTANEOUS
  Administered 2019-01-03 – 2019-01-04 (×3): 3 [IU] via SUBCUTANEOUS
  Administered 2019-01-04 (×2): 2 [IU] via SUBCUTANEOUS
  Administered 2019-01-05 (×2): 3 [IU] via SUBCUTANEOUS
  Administered 2019-01-06: 5 [IU] via SUBCUTANEOUS
  Administered 2019-01-06: 1 [IU] via SUBCUTANEOUS
  Administered 2019-01-06: 5 [IU] via SUBCUTANEOUS
  Administered 2019-01-07: 7 [IU] via SUBCUTANEOUS
  Administered 2019-01-07: 2 [IU] via SUBCUTANEOUS

## 2019-01-02 MED ORDER — INSULIN DETEMIR 100 UNIT/ML ~~LOC~~ SOLN
12.0000 [IU] | Freq: Two times a day (BID) | SUBCUTANEOUS | Status: DC
  Administered 2019-01-02: 12 [IU] via SUBCUTANEOUS
  Filled 2019-01-02 (×3): qty 0.12

## 2019-01-02 MED ORDER — CALCIUM CARBONATE 1250 (500 CA) MG PO TABS
1.0000 | ORAL_TABLET | Freq: Every day | ORAL | Status: DC
  Administered 2019-01-03 – 2019-01-07 (×5): 500 mg via ORAL
  Filled 2019-01-02 (×5): qty 1

## 2019-01-02 MED ORDER — INSULIN ASPART 100 UNIT/ML ~~LOC~~ SOLN
8.0000 [IU] | Freq: Once | SUBCUTANEOUS | Status: AC
  Administered 2019-01-02: 8 [IU] via SUBCUTANEOUS
  Filled 2019-01-02: qty 1

## 2019-01-02 MED ORDER — PANTOPRAZOLE SODIUM 40 MG PO TBEC
40.0000 mg | DELAYED_RELEASE_TABLET | Freq: Every day | ORAL | Status: DC
  Administered 2019-01-02 – 2019-01-07 (×6): 40 mg via ORAL
  Filled 2019-01-02 (×6): qty 1

## 2019-01-02 MED ORDER — SODIUM CHLORIDE 0.9 % IV SOLN
1000.0000 mL | INTRAVENOUS | Status: DC
  Administered 2019-01-02: 1000 mL via INTRAVENOUS

## 2019-01-02 MED ORDER — PROMETHAZINE HCL 25 MG/ML IJ SOLN: 12.5000 mg | Freq: Four times a day (QID) | INTRAMUSCULAR | Status: DC | PRN

## 2019-01-02 MED ORDER — ACETAMINOPHEN 325 MG PO TABS
650.0000 mg | ORAL_TABLET | Freq: Four times a day (QID) | ORAL | Status: DC | PRN
  Administered 2019-01-03: 650 mg via ORAL
  Filled 2019-01-02: qty 2

## 2019-01-02 MED ORDER — LOSARTAN POTASSIUM 50 MG PO TABS
50.0000 mg | ORAL_TABLET | Freq: Every day | ORAL | Status: DC
  Administered 2019-01-02 – 2019-01-07 (×6): 50 mg via ORAL
  Filled 2019-01-02 (×6): qty 1

## 2019-01-02 MED ORDER — SODIUM CHLORIDE 0.9 % IV SOLN
1.0000 g | Freq: Once | INTRAVENOUS | Status: AC
  Administered 2019-01-02: 1 g via INTRAVENOUS
  Filled 2019-01-02: qty 10

## 2019-01-02 MED ORDER — LACTULOSE 10 GM/15ML PO SOLN
30.0000 g | Freq: Once | ORAL | Status: AC
  Administered 2019-01-02: 30 g via ORAL
  Filled 2019-01-02: qty 45

## 2019-01-02 MED ORDER — MORPHINE SULFATE (PF) 4 MG/ML IV SOLN
4.0000 mg | Freq: Once | INTRAVENOUS | Status: AC
  Administered 2019-01-02: 4 mg via INTRAVENOUS
  Filled 2019-01-02: qty 1

## 2019-01-02 MED ORDER — OXYBUTYNIN CHLORIDE 5 MG PO TABS
5.0000 mg | ORAL_TABLET | Freq: Two times a day (BID) | ORAL | Status: DC
  Administered 2019-01-02 – 2019-01-07 (×11): 5 mg via ORAL
  Filled 2019-01-02 (×11): qty 1

## 2019-01-02 MED ORDER — PREGABALIN 75 MG PO CAPS
75.0000 mg | ORAL_CAPSULE | Freq: Every day | ORAL | Status: DC
  Administered 2019-01-02 – 2019-01-06 (×5): 75 mg via ORAL
  Filled 2019-01-02 (×5): qty 1

## 2019-01-02 MED ORDER — MORPHINE SULFATE (PF) 2 MG/ML IV SOLN
1.0000 mg | INTRAVENOUS | Status: DC | PRN
  Administered 2019-01-02 (×2): 1 mg via INTRAVENOUS
  Filled 2019-01-02 (×2): qty 1

## 2019-01-02 MED ORDER — ATORVASTATIN CALCIUM 20 MG PO TABS
20.0000 mg | ORAL_TABLET | Freq: Every day | ORAL | Status: DC
  Administered 2019-01-02 – 2019-01-06 (×5): 20 mg via ORAL
  Filled 2019-01-02 (×5): qty 1

## 2019-01-02 MED ORDER — INSULIN ASPART 100 UNIT/ML ~~LOC~~ SOLN
10.0000 [IU] | Freq: Once | SUBCUTANEOUS | Status: AC
  Administered 2019-01-02: 10 [IU] via SUBCUTANEOUS
  Filled 2019-01-02: qty 1

## 2019-01-02 MED ORDER — FUROSEMIDE 40 MG PO TABS
80.0000 mg | ORAL_TABLET | Freq: Two times a day (BID) | ORAL | Status: DC
  Administered 2019-01-02 – 2019-01-06 (×9): 80 mg via ORAL
  Filled 2019-01-02 (×8): qty 2

## 2019-01-02 MED ORDER — MAGNESIUM OXIDE 400 (241.3 MG) MG PO TABS
400.0000 mg | ORAL_TABLET | Freq: Every day | ORAL | Status: DC
  Administered 2019-01-02 – 2019-01-07 (×6): 400 mg via ORAL
  Filled 2019-01-02 (×6): qty 1

## 2019-01-02 MED ORDER — ANASTROZOLE 1 MG PO TABS
1.0000 mg | ORAL_TABLET | Freq: Every day | ORAL | Status: DC
  Administered 2019-01-02 – 2019-01-07 (×6): 1 mg via ORAL
  Filled 2019-01-02 (×6): qty 1

## 2019-01-02 NOTE — ED Notes (Signed)
Patient transported to CT via stretcher.

## 2019-01-02 NOTE — ED Notes (Addendum)
Report given to [place], Therapist, sports. Attempted to call daughter with patient care update, but no answer.

## 2019-01-02 NOTE — ED Notes (Signed)
Please call patient's daughter, [name], at [phone] with patient care updates.

## 2019-01-02 NOTE — ED Notes (Signed)
ED TO INPATIENT HANDOFF REPORT   Name/Age/Gender [name] 85 y.o. female Room/Bed: WA06/WA06  Code Status   Code Status: Prior  Home/SNF/Other Home Patient oriented to: self, place, time and situation Is this baseline? Yes   Triage Complete: Triage complete  Chief Complaint abdominal pain   Triage Note Patient c/o mid abdominal pain and nausea.  No BM x 4 days. Patient has had Miralax, hot tea, and fleets enema with no results. Abdomen distended.   Allergies Allergies  Allergen Reactions  . Aspirin Itching  . Penicillins Itching and Rash    DID THE REACTION INVOLVE: Swelling of the face/tongue/throat, SOB, or low BP? y Sudden or severe rash/hives, skin peeling, or the inside of the mouth or nose? n Did it require medical treatment? yes When did it last happen?more than 10 years If all above answers are "NO", may proceed with cephalosporin use.     Level of Care/Admitting Diagnosis ED Disposition    ED Disposition Condition Comment   Admit  Hospital Area: [facility] [[id number]]  Level of Care: Med-Surg [16]  Diagnosis: Acute pancreatitis [577.0.ICD-9-CM]  Admitting Physician: Shela Leff [[id number]]  Attending Physician: Shela Leff [[id number]]  PT Class (Do Not Modify): Observation [104]  PT Acc Code (Do Not Modify): Observation [10022]       B Medical/Surgery History Past Medical History:  Diagnosis Date  . Anemia   . Arthritis   . Cancer ([place])   . Cataract   . Chronic diastolic CHF (congestive heart failure) ([place])   . CKD (chronic kidney disease), stage III ([place])   . Clotting disorder ([place])   . Diabetes mellitus ([place])   . Dizziness and giddiness   . DVT (deep venous thrombosis) ([place])    a. On Coumadin for this.  . Essential hypertension   . LBBB (left bundle branch block)   . Mitral regurgitation    a. Mod MR by echo 2014.  . Muscular deconditioning   . Obesity   . SVT (supraventricular tachycardia) ([place])     a. In 2013 she had an EPS with ablation for SVT which did not eliminate the SVT completely. She also had bradycardia which limited medication. She was placed on amiodarone by Dr. Lovena Le.  . Thrombocytopenia ([place]) 11/21/2011   Past Surgical History:  Procedure Laterality Date  . EYE SURGERY    . SUPRAVENTRICULAR TACHYCARDIA ABLATION N/A 10/11/2012   Procedure: SUPRAVENTRICULAR TACHYCARDIA ABLATION;  Surgeon: Evans Lance, MD;  Location: [facility] CATH LAB;  Service: Cardiovascular;  Laterality: N/A;     A IV Location/Drains/Wounds Patient Lines/Drains/Airways Status   Active Line/Drains/Airways    Name:   Placement date:   Placement time:   Site:   Days:   Peripheral IV 01/01/19 Left Antecubital   01/01/19    2237    Antecubital   1   External Urinary Catheter   11/07/18    1600    -   56          Intake/Output Last 24 hours  Intake/Output Summary (Last 24 hours) at 01/02/2019 0338 Last data filed at 01/02/2019 0314 Gross per 24 hour  Intake 1600 ml  Output -  Net 1600 ml    Labs/Imaging Results for orders placed or performed during the hospital encounter of 01/01/19 (from the past 48 hour(s))  Lipase, blood     Status: Abnormal   Collection Time: 01/01/19  5:23 PM  Result Value Ref Range   Lipase 80 (H) 11 -  51 U/L    Comment: Performed at [facility], [place] [address], [place], [place] [address]  Comprehensive metabolic panel     Status: Abnormal   Collection Time: 01/01/19  5:23 PM  Result Value Ref Range   Sodium 135 135 - 145 mmol/L   Potassium 4.3 3.5 - 5.1 mmol/L   Chloride 97 (L) 98 - 111 mmol/L   CO2 28 22 - 32 mmol/L   Glucose, Bld 443 (H) 70 - 99 mg/dL   BUN 25 (H) 8 - 23 mg/dL   Creatinine, Ser 1.46 (H) 0.44 - 1.00 mg/dL   Calcium 8.8 (L) 8.9 - 10.3 mg/dL   Total Protein 7.6 6.5 - 8.1 g/dL   Albumin 3.4 (L) 3.5 - 5.0 g/dL   AST 24 15 - 41 U/L   ALT 26 0 - 44 U/L   Alkaline Phosphatase 83 38 - 126 U/L   Total Bilirubin 0.7 0.3 - 1.2 mg/dL    GFR calc non Af Amer 33 (L) >60 mL/min   GFR calc Af Amer 38 (L) >60 mL/min   Anion gap 10 5 - 15    Comment: Performed at [facility], [place] [address] [place], [place] [address]  CBC     Status: Abnormal   Collection Time: 01/01/19  5:23 PM  Result Value Ref Range   WBC 5.4 4.0 - 10.5 K/uL   RBC 3.56 (L) 3.87 - 5.11 MIL/uL   Hemoglobin 10.6 (L) 12.0 - 15.0 g/dL   HCT 33.7 (L) 36.0 - 46.0 %   MCV 94.7 80.0 - 100.0 fL   MCH 29.8 26.0 - 34.0 pg   MCHC 31.5 30.0 - 36.0 g/dL   RDW 14.0 11.5 - 15.5 %   Platelets 122 (L) 150 - 400 K/uL   nRBC 0.0 0.0 - 0.2 %    Comment: Performed at [facility], [place] [address] [place], [place] [address]  Urinalysis, Routine w reflex microscopic     Status: Abnormal   Collection Time: 01/01/19  5:23 PM  Result Value Ref Range   Color, Urine YELLOW YELLOW   APPearance HAZY (A) CLEAR   Specific Gravity, Urine 1.009 1.005 - 1.030   pH 6.0 5.0 - 8.0   Glucose, UA >=500 (A) NEGATIVE mg/dL   Hgb urine dipstick SMALL (A) NEGATIVE   Bilirubin Urine NEGATIVE NEGATIVE   Ketones, ur 5 (A) NEGATIVE mg/dL   Protein, ur NEGATIVE NEGATIVE mg/dL   Nitrite NEGATIVE NEGATIVE   Leukocytes,Ua LARGE (A) NEGATIVE   RBC / HPF 0-5 0 - 5 RBC/hpf   WBC, UA >50 (H) 0 - 5 WBC/hpf   Bacteria, UA RARE (A) NONE SEEN   Squamous Epithelial / LPF 0-5 0 - 5   Mucus PRESENT    Hyaline Casts, UA PRESENT     Comment: Performed at [facility], [place] [address] [place], [place] [address]  CBG monitoring, ED     Status: Abnormal   Collection Time: 01/02/19  2:19 AM  Result Value Ref Range   Glucose-Capillary 344 (H) 70 - 99 mg/dL   Comment 1 Notify RN    Ct Abdomen Pelvis Wo Contrast  Result Date: 01/02/2019 CLINICAL DATA:  85 y/o F; mid abdominal pain, nausea, and constipation for 4 days. EXAM: CT ABDOMEN AND PELVIS WITHOUT CONTRAST TECHNIQUE: Multidetector CT imaging of the abdomen and pelvis was performed following the  standard protocol without IV contrast. COMPARISON:  11/06/2018 pelvic CT. 08/01/2015 CT  abdomen and pelvis. 02/08/2017 lumbar spine MRI. FINDINGS: Lower chest: 2-3 mm scattered nodules in the lung bases are stable compatible with benign etiology. Small hiatal hernia. Hepatobiliary: No focal liver abnormality is seen. No gallstones, gallbladder wall thickening, or biliary dilatation. Pancreas: Pancreas is diffusely ill-defined with surrounding edema. Spleen: Normal in size without focal abnormality. Adrenals/Urinary Tract: Normal adrenal glands. Multiple nonobstructing kidney stones bilaterally measuring up to 4 mm in the lower pole of left kidney. No hydronephrosis. Normal bladder. Stomach/Bowel: Stomach is within normal limits. Appendix appears normal. No evidence of bowel wall thickening, distention, or inflammatory changes. Vascular/Lymphatic: No significant vascular findings are present. No enlarged abdominal or pelvic lymph nodes. Reproductive: Status post hysterectomy. No adnexal masses. Other: No abdominal wall hernia or abnormality. No abdominopelvic ascites. Musculoskeletal: No fracture is seen. L4-5 stable grade 1 anterolisthesis. Progression of L2-3 degenerative changes with endplate eburnation, loss of disc space height, and vacuum phenomenon. Moderate to severe multifactorial spinal canal stenosis at L2-3 and L4-5. IMPRESSION: 1. Diffusely ill-defined pancreas with surrounding edema, likely acute pancreatitis. No discrete acute peripancreatic collection identified on this noncontrast examination. 2. Bilateral nonobstructing kidney stones. 3. Small hiatal hernia. 4. Progression of lumbar degenerative changes with moderate to severe multifactorial spinal canal stenosis at L2-3 and L4-5. Electronically Signed   By: Kristine Garbe M.D.   On: 01/02/2019 01:16    Pending Labs Unresulted Labs (From admission, onward)    Start     Ordered   01/02/19 0043  Urine culture  ONCE - STAT,   STAT      01/02/19 0042          Vitals/Pain Today's Vitals   01/02/19 0206 01/02/19 0230 01/02/19 0300 01/02/19 0319  BP:  133/60 (!) 135/55   Pulse:  74 77   Resp:   17   Temp:      TempSrc:      SpO2:  100% 100%   Weight:      Height:      PainSc: 0-No pain   0-No pain    Isolation Precautions No active isolations  Medications Medications  sodium chloride 0.9 % bolus 500 mL (0 mLs Intravenous Stopped 01/01/19 2357)    Followed by  0.9 %  sodium chloride infusion (1,000 mLs Intravenous New Bag/Given 01/02/19 0318)  morphine 2 MG/ML injection 1 mg (has no administration in time range)  promethazine (PHENERGAN) injection 12.5 mg (has no administration in time range)  sodium chloride flush (NS) 0.9 % injection 3 mL (3 mLs Intravenous Given 01/01/19 2238)  iohexol (OMNIPAQUE) 300 MG/ML solution 30 mL (30 mLs Oral Contrast Given 01/01/19 2207)  cefTRIAXone (ROCEPHIN) 1 g in sodium chloride 0.9 % 100 mL IVPB (0 g Intravenous Stopped 01/02/19 0121)  insulin aspart (novoLOG) injection 8 Units (8 Units Subcutaneous Given 01/02/19 0120)  morphine 4 MG/ML injection 4 mg (4 mg Intravenous Given 01/02/19 0120)  insulin aspart (novoLOG) injection 10 Units (10 Units Subcutaneous Given 01/02/19 0316)    Mobility walks with person assist Moderate fall risk

## 2019-01-02 NOTE — Care Management Obs Status (Signed)
[facility] NOTIFICATION   Patient Details  Name: ADALEY KIENE MRN: [id number] Date of Birth: 09-Mar-1933   Medicare Observation Status Notification Given:  Yes    Nila Nephew, LCSW 01/02/2019, 3:34 PM

## 2019-01-02 NOTE — ED Provider Notes (Signed)
12:45 AM  Assumed care from Dr. Tomi Bamberger.  Patient is an 85 year old female who presents to the emergency department with abdominal pain and constipation.  Family member tried laxatives at home without relief.  No fecal impaction here on exam.  Does have a urinary tract infection.  Culture pending.  Received IV Rocephin.  Is hyperglycemic.  Received insulin in the ED.  CT of the abdomen pelvis pending to rule out obstruction.  1:50 AM  Pt's CT scan shows acute pancreatitis without necrosis, pseudocyst.  We will continue to hydrate patient.  Will recheck blood glucose.  Pain is controlled at this time.  Will admit patient.  3:00 AM Discussed patient's case with hospitalist, Dr. Marlowe Sax.  I have recommended admission and patient (and family if present) agree with this plan. Admitting physician will place admission orders.   Blood glucose has improved to 3 8:44 units of insulin.  Will give another 5 units of insulin.   I reviewed all nursing notes, vitals, pertinent previous records, EKGs, lab and urine results, imaging (as available).     Ward, Delice Bison, DO 01/02/19 0300

## 2019-01-02 NOTE — H&P (Addendum)
History and Physical    DOA: 01/01/2019  PCP: [name], MD  Patient coming from: Home  Chief Complaint: Abdominal pain  HPI: [name] is a 85 y.o. female with history h/o diabetes mellitus, DVT, chronic kidney disease stage III, hypertension, SVT, chronic diastolic CHF, chronic anemia presents with complaints of mid abdominal pain associated with nausea for the last 3 to 4 days.  Patient states she has been constipated with last bowel movement 4 days prior which was not relieved in spite of taking laxatives.  Patient related mid abdominal pain to constipation but after taking laxatives pain got worse and she decided to come to the ED.  She reports associated nausea but no vomiting.  She describes pain as cramping, 9/10, intermittent, radiating to back, alleviated to 4/10 after receiving pain medications in the ED.  Patient denies history of peptic ulcer disease but reports history of gallstones which were incidentally found on the CT previously.  She denies prior history of pancreatitis.  She denies alcohol or substance abuse history.  Denies any new medications other than over-the-counter laxatives.  CT abdomen/pelvis obtained in the ED showed diffuse pancreatic inflammation.  Lipase at 80.  Patient admitted to medical service for evaluation and management of acute pancreatitis.  She currently rates her pain at 2/10 and feels nausea is improving.  She wants to try clear liquid diet.  She has not had a bowel movement since she has been here.  She otherwise denies any history of melena, hematochezia, weight loss, chest pain, shortness of breath or palpitations.    Review of Systems: As per HPI otherwise 10 point review of systems negative.    Past Medical History:  Diagnosis Date  . Anemia   . Arthritis   . Cancer ([place])   . Cataract   . Chronic diastolic CHF (congestive heart failure) ([place])   . CKD (chronic kidney disease), stage III ([place])   . Clotting disorder ([place])   . Diabetes  mellitus ([place])   . Dizziness and giddiness   . DVT (deep venous thrombosis) ([place])    a. On Coumadin for this.  . Essential hypertension   . LBBB (left bundle branch block)   . Mitral regurgitation    a. Mod MR by echo 2014.  . Muscular deconditioning   . Obesity   . SVT (supraventricular tachycardia) ([place])    a. In 2013 she had an EPS with ablation for SVT which did not eliminate the SVT completely. She also had bradycardia which limited medication. She was placed on amiodarone by Dr. Lovena Le.  . Thrombocytopenia ([place]) 11/21/2011    Past Surgical History:  Procedure Laterality Date  . EYE SURGERY    . SUPRAVENTRICULAR TACHYCARDIA ABLATION N/A 10/11/2012   Procedure: SUPRAVENTRICULAR TACHYCARDIA ABLATION;  Surgeon: Evans Lance, MD;  Location: [facility] CATH LAB;  Service: Cardiovascular;  Laterality: N/A;    Social history:  reports that she has never smoked. She has never used smokeless tobacco. She reports that she does not drink alcohol or use drugs.   Allergies  Allergen Reactions  . Aspirin Itching  . Penicillins Itching and Rash    DID THE REACTION INVOLVE: Swelling of the face/tongue/throat, SOB, or low BP? y Sudden or severe rash/hives, skin peeling, or the inside of the mouth or nose? n Did it require medical treatment? yes When did it last happen?more than 10 years If all above answers are "NO", may proceed with cephalosporin use.     Family History  Problem Relation Age of Onset  . Stroke Mother   . Cancer Father        prostate  . Hypertension Daughter   . Heart attack Brother        x2  . Hypertension Brother   . Stroke Sister   . Hypertension Sister   . Breast cancer Maternal Aunt       Prior to Admission medications   Medication Sig Start Date End Date Taking? Authorizing Provider  acetaminophen (TYLENOL) 500 MG tablet Take 1,000 mg by mouth every 6 (six) hours as needed.   Yes [provider]  amiodarone (PACERONE) 200 MG tablet Take 1  tablet (200 mg total) by mouth daily. 10/05/18  Yes Jettie Booze, MD  anastrozole (ARIMIDEX) 1 MG tablet Take 1 tablet (1 mg total) by mouth daily. 11/28/18  Yes Nicholas Lose, MD  apixaban (ELIQUIS) 5 MG TABS tablet Take 1 tablet (5 mg total) by mouth 2 (two) times daily. 11/09/18  Yes Purohit, Konrad Dolores, MD  atorvastatin (LIPITOR) 20 MG tablet TAKE 1 TABLET BY MOUTH AT NIGHT Patient taking differently: Take 20 mg by mouth daily at 6 PM.  10/04/18  Yes [name], MD  calcium carbonate (OS-CAL) 600 MG TABS Take 600 mg by mouth daily with breakfast.    Yes [provider]  carvedilol (COREG) 12.5 MG tablet Take 1 tablet (12.5 mg total) by mouth 2 (two) times daily. 09/14/18 01/01/19 Yes Lyda Jester M, PA-C  diclofenac sodium (VOLTAREN) 1 % GEL Apply 2 g topically 4 (four) times daily. 10/30/18  Yes Rodriguez-Southworth, Sunday Spillers, PA-C  furosemide (LASIX) 80 MG tablet Take 1 tablet (80 mg total) by mouth 2 (two) times daily. 07/09/18  Yes Bhagat, Bhavinkumar, PA  HUMALOG KWIKPEN 200 UNIT/ML SOPN Inject 4-8 Units into the skin 3 (three) times daily. Per sliding scale 01/01/19  Yes [name], MD  LEVEMIR FLEXTOUCH 100 UNIT/ML Pen Inject 10-16 Units into the skin daily. 16 units in the am and 10 units in the evening 01/19/16  Yes [provider]  losartan (COZAAR) 100 MG tablet TAKE 1/2 TABLET(50 MG) BY MOUTH DAILY Patient taking differently: Take 50 mg by mouth daily.  02/06/18  Yes Jettie Booze, MD  magnesium oxide (MAG-OX) 400 MG tablet Take 400 mg by mouth daily.   Yes [provider]  metoprolol succinate (TOPROL-XL) 50 MG 24 hr tablet Take 50 mg by mouth daily. 12/28/18  Yes [provider]  omeprazole (PRILOSEC) 40 MG capsule TAKE ONE CAPSULE BY MOUTH DAILY BEFORE A MEAL Patient taking differently: Take 40 mg by mouth daily.  10/25/18  Yes [name], MD  oxybutynin (DITROPAN) 5 MG tablet Take 1 tablet (5 mg total) by mouth 2 (two) times  daily. 10/16/18  Yes [name], MD  potassium chloride (MICRO-K) 10 MEQ CR capsule Take 4 tablets by mouth X's 1 day then go back to 2 tablets by mouth daily 01/20/16  Yes Rosita Fire, Brittainy M, PA-C  pregabalin (LYRICA) 75 MG capsule Take 75 mg by mouth at bedtime.   Yes [provider]  traMADol (ULTRAM) 50 MG tablet Take 50 mg by mouth 2 (two) times daily as needed for pain. 12/21/18  Yes [provider]  [place] test strip USE AS DIRECTED TO CHECK BLOOD SUGAR QID 02/03/16   [provider]  BD PEN NEEDLE NANO U/F 32G X 4 MM MISC Use as directed 10/16/18   [name], MD    Physical  Exam: Vitals:   01/02/19 0300 01/02/19 0330 01/02/19 0430 01/02/19 1319  BP: (!) 135/55 (!) 125/56 (!) 146/66 (!) 140/59  Pulse: 77 73 75 72  Resp: 17 16 17 16   Temp:   100 F (37.8 C) 99.9 F (37.7 C)  TempSrc:   Oral Oral  SpO2: 100% 100% 94% 93%  Weight:      Height:        Constitutional: NAD, calm, comfortable Eyes: PERRL, lids and conjunctivae normal ENMT: Mucous membranes are moist. Posterior pharynx clear of any exudate or lesions.Normal dentition.  Neck: normal, supple, no masses, no thyromegaly Respiratory: clear to auscultation bilaterally, no wheezing, no crackles. Normal respiratory effort. No accessory muscle use.  Cardiovascular: Regular rate and rhythm, no murmurs / rubs / gallops. Bilateral chronic lower extremity edema. 2+ pedal pulses. No carotid bruits.  Abdomen: Mild tenderness to deep palpation in epigastrium/umbilical/mid abdominal area, no masses palpated.  Murphy sign negative.  No hepatosplenomegaly. Bowel sounds positive.  Musculoskeletal: no clubbing / cyanosis. No joint deformity upper and lower extremities. Good ROM, no contractures. Normal muscle tone.  Chronic leg edema Neurologic: CN 2-12 grossly intact. Sensation intact, DTR normal. Strength 5/5 in all 4.  Psychiatric: Normal judgment and insight. Alert and oriented x 3.  Normal mood.  SKIN/catheters: no rashes, lesions, ulcers. No induration  Labs on Admission: I have personally reviewed following labs and imaging studies  CBC: Recent Labs  Lab 01/01/19 1723  WBC 5.4  HGB 10.6*  HCT 33.7*  MCV 94.7  PLT 347*   Basic Metabolic Panel: Recent Labs  Lab 01/01/19 1723  NA 135  K 4.3  CL 97*  CO2 28  GLUCOSE 443*  BUN 25*  CREATININE 1.46*  CALCIUM 8.8*   GFR: Estimated Creatinine Clearance: 32.3 mL/min (A) (by C-G formula based on SCr of 1.46 mg/dL (H)). Liver Function Tests: Recent Labs  Lab 01/01/19 1723  AST 24  ALT 26  ALKPHOS 83  BILITOT 0.7  PROT 7.6  ALBUMIN 3.4*   Recent Labs  Lab 01/01/19 1723  LIPASE 80*   No results for input(s): AMMONIA in the last 168 hours. Coagulation Profile: No results for input(s): INR, PROTIME in the last 168 hours. Cardiac Enzymes: No results for input(s): CKTOTAL, CKMB, CKMBINDEX, TROPONINI in the last 168 hours. BNP (last 3 results) Recent Labs    04/04/18 1025 07/06/18 1118 08/06/18 1244  PROBNP 1,189* 1,500* 1,604*   HbA1C: No results for input(s): HGBA1C in the last 72 hours. CBG: Recent Labs  Lab 01/02/19 0219 01/02/19 0528 01/02/19 0744 01/02/19 1205  GLUCAP 344* 152* 117* 161*   Lipid Profile: No results for input(s): CHOL, HDL, LDLCALC, TRIG, CHOLHDL, LDLDIRECT in the last 72 hours. Thyroid Function Tests: No results for input(s): TSH, T4TOTAL, FREET4, T3FREE, THYROIDAB in the last 72 hours. Anemia Panel: No results for input(s): VITAMINB12, FOLATE, FERRITIN, TIBC, IRON, RETICCTPCT in the last 72 hours. Urine analysis:    Component Value Date/Time   COLORURINE YELLOW 01/01/2019 1723   APPEARANCEUR HAZY (A) 01/01/2019 1723   LABSPEC 1.009 01/01/2019 1723   PHURINE 6.0 01/01/2019 1723   GLUCOSEU >=500 (A) 01/01/2019 1723   HGBUR SMALL (A) 01/01/2019 1723   BILIRUBINUR NEGATIVE 01/01/2019 1723   KETONESUR 5 (A) 01/01/2019 1723   PROTEINUR NEGATIVE 01/01/2019  1723   UROBILINOGEN 0.2 10/12/2012 2037   NITRITE NEGATIVE 01/01/2019 1723   LEUKOCYTESUR LARGE (A) 01/01/2019 1723    Radiological Exams on Admission: Ct Abdomen Pelvis Wo Contrast  Result Date: 01/02/2019 CLINICAL DATA:  85 y/o F; mid abdominal pain, nausea, and constipation for 4 days. EXAM: CT ABDOMEN AND PELVIS WITHOUT CONTRAST TECHNIQUE: Multidetector CT imaging of the abdomen and pelvis was performed following the standard protocol without IV contrast. COMPARISON:  11/06/2018 pelvic CT. 08/01/2015 CT abdomen and pelvis. 02/08/2017 lumbar spine MRI. FINDINGS: Lower chest: 2-3 mm scattered nodules in the lung bases are stable compatible with benign etiology. Small hiatal hernia. Hepatobiliary: No focal liver abnormality is seen. No gallstones, gallbladder wall thickening, or biliary dilatation. Pancreas: Pancreas is diffusely ill-defined with surrounding edema. Spleen: Normal in size without focal abnormality. Adrenals/Urinary Tract: Normal adrenal glands. Multiple nonobstructing kidney stones bilaterally measuring up to 4 mm in the lower pole of left kidney. No hydronephrosis. Normal bladder. Stomach/Bowel: Stomach is within normal limits. Appendix appears normal. No evidence of bowel wall thickening, distention, or inflammatory changes. Vascular/Lymphatic: No significant vascular findings are present. No enlarged abdominal or pelvic lymph nodes. Reproductive: Status post hysterectomy. No adnexal masses. Other: No abdominal wall hernia or abnormality. No abdominopelvic ascites. Musculoskeletal: No fracture is seen. L4-5 stable grade 1 anterolisthesis. Progression of L2-3 degenerative changes with endplate eburnation, loss of disc space height, and vacuum phenomenon. Moderate to severe multifactorial spinal canal stenosis at L2-3 and L4-5. IMPRESSION: 1. Diffusely ill-defined pancreas with surrounding edema, likely acute pancreatitis. No discrete acute peripancreatic collection identified on this  noncontrast examination. 2. Bilateral nonobstructing kidney stones. 3. Small hiatal hernia. 4. Progression of lumbar degenerative changes with moderate to severe multifactorial spinal canal stenosis at L2-3 and L4-5. Electronically Signed   By: Kristine Garbe M.D.   On: 01/02/2019 01:16      Assessment and Plan:   1.  Acute pancreatitis: CT abdomen as above shows signs of diffuse inflammation of the pancreas.  Unclear etiology.  Denies any alcohol abuse.  CT does not report any gallstones but patient states she was told to have gallbladder stones in the past.  Bilirubin normal on arrival.  No ductal dilatation reported.  Gallstone pancreatitis unlikely.  Check triglyceride levels.?  Medication induced.  Will treat conservatively with clear liquid diet, IV fluids and pain medications.  2.  Constipation: No significant stool burden reported on CT.  Patient however reports last bowel movement 4 days back.  Enema x1 and stool softeners.  3.  History of DVT: On anticoagulation with Eliquis (previosuly on coumadin which was discontinued after she had ileopsoas hematoma with supratherapeutic INR)  4.  SVT: Status post ablation with recurrence.  On amiodarone/b-blockers.  5.  Diabetes mellitus, uncontrolled with hyperglycemia:   Patient received insulin 8 units in the ED and 10 units additional coverage before arriving on the floor. Blood glucose improved from 443 > 160. No signs of DKA on labs. Resume Lantus/Sliding scale insulin.  Check hemoglobin A1c  6.  Chronic kidney disease stage III: Creatinine stable at 1.4 (baseline appears to be 1.2-1.5)  7. HTN/chronic combined systolic-diastolic CHF: resume lasix, b-blockers, losartan . Last echo EF 50-55%/, LVH, Mode MR, Severe Pulm HTN  8.Breast cancer: Resume Arimidex  DVT prophylaxis: On anticoagulation  Code Status: Full code.  Patient states she is in the process of making a living will.  Family Communication: Discussed with  patient. Health care proxy would be her daughter with whom she lives Consults called: None Admission status:  Patient admitted as observation as anticipated LOS less than 2 midnights    Guilford Shi MD [facility] Pager [phone]  If 7PM-7AM, please contact  night-coverage [email or web address] Password [facility]  01/02/2019, 1:36 PM

## 2019-01-02 NOTE — ED Notes (Signed)
Patient transported back from CT 

## 2019-01-03 ENCOUNTER — Observation Stay (HOSPITAL_COMMUNITY): Payer: Medicare Other

## 2019-01-03 DIAGNOSIS — E08649 Diabetes mellitus due to underlying condition with hypoglycemia without coma: Secondary | ICD-10-CM

## 2019-01-03 DIAGNOSIS — I10 Essential (primary) hypertension: Secondary | ICD-10-CM

## 2019-01-03 DIAGNOSIS — K859 Acute pancreatitis without necrosis or infection, unspecified: Principal | ICD-10-CM

## 2019-01-03 LAB — GLUCOSE, CAPILLARY
GLUCOSE-CAPILLARY: 55 mg/dL — AB (ref 70–99)
Glucose-Capillary: 107 mg/dL — ABNORMAL HIGH (ref 70–99)
Glucose-Capillary: 221 mg/dL — ABNORMAL HIGH (ref 70–99)
Glucose-Capillary: 247 mg/dL — ABNORMAL HIGH (ref 70–99)
Glucose-Capillary: 225 mg/dL — ABNORMAL HIGH (ref 70–99)

## 2019-01-03 LAB — URINE CULTURE: Culture: <10000 — AB

## 2019-01-03 MED ORDER — DEXTROSE 50 % IV SOLN
25.0000 mL | Freq: Once | INTRAVENOUS | Status: AC
  Administered 2019-01-03: 25 mL via INTRAVENOUS

## 2019-01-03 MED ORDER — INSULIN DETEMIR 100 UNIT/ML ~~LOC~~ SOLN
10.0000 [IU] | Freq: Two times a day (BID) | SUBCUTANEOUS | Status: DC
  Administered 2019-01-03 – 2019-01-07 (×8): 10 [IU] via SUBCUTANEOUS
  Filled 2019-01-03 (×9): qty 0.1

## 2019-01-03 MED ORDER — DEXTROSE 50 % IV SOLN
INTRAVENOUS | Status: AC
  Filled 2019-01-03: qty 50

## 2019-01-03 NOTE — Progress Notes (Signed)
PROGRESS NOTE    [name]  TML:[id number] DOB: May 27, 1933 DOA: 01/01/2019 PCP: [name], MD   Brief Narrative: [name] is a 85 y.o. female with a history of diabetes, DVT, CKD stage III, hypertension, SVT, chronic diastolic heart failure, chronic anemia.  Patient presented secondary to abdominal pain was found to have acute pancreatitis of unknown etiology.   Assessment & Plan:   Active Problems:   Acute pancreatitis   Acute pancreatitis Symptoms significantly improved. Lipase of only 80, however. CT scan not very specific for acute pancreatitis per radiologist read. Unknown etiology of patient has a history of gallstones.  Imaging not significant for any cholelithiasis or ductal dilatation. -Advance diet as tolerated today -RUQ ultrasound to ensure no gallstones  Constipation Given an enema and started on stool softeners  History of DVT -Continue Eliquis  History of SVT Status post ablation with recurrence.  Currently rate controlled. -Continue amiodarone, coreg  Diabetes mellitus, uncontrolled Patient is on insulin as outpatient uncontrolled with hyperglycemia but also with hypoglycemia in setting of decreased oral intake overnight. -Continue Levemir 12 units twice daily now the patient's diet is advancing  CKD stage III Stable  Essential hypertension -Continue Coreg and losartan  Chronic diastolic heart failure Appears euvolemic.  On Lasix, losartan, coreg, as an outpatient.  Last EF of 50 to 55%.  Breast cancer Patient follows with Dr. Lindi Adie -Continue anastrozole  Nephrolithiasis -Nonobstructive  Moderate-severe multifactorial spinal canal stenosis Located at L2-3 and L4-5. -PT -Outpatient referral to neurosurgery   DVT prophylaxis: Eliquis Code Status:   Code Status: Prior Family Communication: None at bedside Disposition Plan: Discharge pending PT eval, improvement of hypoglycemia and continued resolution of acute  pancreatitis   Consultants:   None  Procedures:   None  Antimicrobials:  None   Subjective: Patient reports no abdominal pain today.  She was hypoglycemic earlier today.  She reports symptoms of abdominal itchiness when she is hypoglycemic.  Objective: Vitals:   01/02/19 1319 01/02/19 2051 01/03/19 0639 01/03/19 1332  BP: (!) 140/59 (!) 151/62 (!) 138/59 (!) 132/54  Pulse: 72 67 66 65  Resp: 16 14 15 18   Temp: 99.9 F (37.7 C) 99.1 F (37.3 C) 100.2 F (37.9 C) 99.3 F (37.4 C)  TempSrc: Oral Oral Oral Oral  SpO2: 93% 95% 95% 98%  Weight:      Height:        Intake/Output Summary (Last 24 hours) at 01/03/2019 1345 Last data filed at 01/03/2019 0641 Gross per 24 hour  Intake 1965.2 ml  Output 750 ml  Net 1215.2 ml   Filed Weights   01/01/19 1719  Weight: 89.4 kg    Examination:  General exam: Appears calm and comfortable Respiratory system: Clear to auscultation. Respiratory effort normal. Cardiovascular system: S1 & S2 heard, RRR. No murmurs, rubs, gallops or clicks. Gastrointestinal system: Abdomen is nondistended, soft and nontender. No organomegaly or masses felt. Normal bowel sounds heard. Central nervous system: Alert and oriented. No focal neurological deficits. Extremities: No edema. No calf tenderness Skin: No cyanosis. No rashes Psychiatry: Judgement and insight appear normal. Mood & affect appropriate.     Data Reviewed: I have personally reviewed following labs and imaging studies  CBC: Recent Labs  Lab 01/01/19 1723  WBC 5.4  HGB 10.6*  HCT 33.7*  MCV 94.7  PLT 681*   Basic Metabolic Panel: Recent Labs  Lab 01/01/19 1723  NA 135  K 4.3  CL 97*  CO2 28  GLUCOSE 443*  BUN 25*  CREATININE 1.46*  CALCIUM 8.8*   GFR: Estimated Creatinine Clearance: 32.3 mL/min (A) (by C-G formula based on SCr of 1.46 mg/dL (H)). Liver Function Tests: Recent Labs  Lab 01/01/19 1723  AST 24  ALT 26  ALKPHOS 83  BILITOT 0.7  PROT 7.6   ALBUMIN 3.4*   Recent Labs  Lab 01/01/19 1723  LIPASE 80*   No results for input(s): AMMONIA in the last 168 hours. Coagulation Profile: No results for input(s): INR, PROTIME in the last 168 hours. Cardiac Enzymes: No results for input(s): CKTOTAL, CKMB, CKMBINDEX, TROPONINI in the last 168 hours. BNP (last 3 results) Recent Labs    04/04/18 1025 07/06/18 1118 08/06/18 1244  PROBNP 1,189* 1,500* 1,604*   HbA1C: Recent Labs    01/02/19 1658  HGBA1C 8.4*   CBG: Recent Labs  Lab 01/02/19 1739 01/02/19 2054 01/03/19 0751 01/03/19 0825 01/03/19 1126  GLUCAP 142* 100* 55* 107* 221*   Lipid Profile: Recent Labs    01/02/19 1425  TRIG 56   Thyroid Function Tests: No results for input(s): TSH, T4TOTAL, FREET4, T3FREE, THYROIDAB in the last 72 hours. Anemia Panel: No results for input(s): VITAMINB12, FOLATE, FERRITIN, TIBC, IRON, RETICCTPCT in the last 72 hours. Sepsis Labs: No results for input(s): PROCALCITON, LATICACIDVEN in the last 168 hours.  Recent Results (from the past 240 hour(s))  Urine culture     Status: Abnormal   Collection Time: 01/01/19  5:23 PM  Result Value Ref Range Status   Specimen Description   Final    URINE, CLEAN CATCH Performed at [facility], [place] [address], [place], [place] [address]    Special Requests   Final    NONE Performed at [facility], [place] [address] [place], [place] [address]    Culture (A)  Final    <10,000 COLONIES/mL INSIGNIFICANT GROWTH Performed at [place] [address], [place], [place] [address]    Report Status 01/03/2019 FINAL  Final         Radiology Studies: Ct Abdomen Pelvis Wo Contrast  Result Date: 01/02/2019 CLINICAL DATA:  85 y/o F; mid abdominal pain, nausea, and constipation for 4 days. EXAM: CT ABDOMEN AND PELVIS WITHOUT CONTRAST TECHNIQUE: Multidetector CT imaging of the abdomen and pelvis was performed following the standard protocol  without IV contrast. COMPARISON:  11/06/2018 pelvic CT. 08/01/2015 CT abdomen and pelvis. 02/08/2017 lumbar spine MRI. FINDINGS: Lower chest: 2-3 mm scattered nodules in the lung bases are stable compatible with benign etiology. Small hiatal hernia. Hepatobiliary: No focal liver abnormality is seen. No gallstones, gallbladder wall thickening, or biliary dilatation. Pancreas: Pancreas is diffusely ill-defined with surrounding edema. Spleen: Normal in size without focal abnormality. Adrenals/Urinary Tract: Normal adrenal glands. Multiple nonobstructing kidney stones bilaterally measuring up to 4 mm in the lower pole of left kidney. No hydronephrosis. Normal bladder. Stomach/Bowel: Stomach is within normal limits. Appendix appears normal. No evidence of bowel wall thickening, distention, or inflammatory changes. Vascular/Lymphatic: No significant vascular findings are present. No enlarged abdominal or pelvic lymph nodes. Reproductive: Status post hysterectomy. No adnexal masses. Other: No abdominal wall hernia or abnormality. No abdominopelvic ascites. Musculoskeletal: No fracture is seen. L4-5 stable grade 1 anterolisthesis. Progression of L2-3 degenerative changes with endplate eburnation, loss of disc space height, and vacuum phenomenon. Moderate to severe multifactorial spinal canal stenosis at L2-3 and L4-5. IMPRESSION: 1. Diffusely ill-defined pancreas with surrounding edema, likely acute pancreatitis. No discrete acute peripancreatic collection identified on this  noncontrast examination. 2. Bilateral nonobstructing kidney stones. 3. Small hiatal hernia. 4. Progression of lumbar degenerative changes with moderate to severe multifactorial spinal canal stenosis at L2-3 and L4-5. Electronically Signed   By: Kristine Garbe M.D.   On: 01/02/2019 01:16        Scheduled Meds: . amiodarone  200 mg Oral Daily  . anastrozole  1 mg Oral Daily  . apixaban  5 mg Oral BID  . atorvastatin  20 mg Oral q1800   . calcium carbonate  1 tablet Oral Q breakfast  . carvedilol  12.5 mg Oral BID  . dextrose      . furosemide  80 mg Oral BID  . insulin aspart  0-9 Units Subcutaneous TID WC  . insulin detemir  12 Units Subcutaneous BID  . losartan  50 mg Oral Daily  . magnesium oxide  400 mg Oral Daily  . oxybutynin  5 mg Oral BID  . pantoprazole  40 mg Oral Daily  . potassium chloride  20 mEq Oral Daily  . pregabalin  75 mg Oral QHS   Continuous Infusions: . lactated ringers 100 mL/hr at 01/03/19 1423     LOS: 0 days     Cordelia Poche, MD [facility] 01/03/2019, 1:45 PM  If 7PM-7AM, please contact night-coverage [email or web address]

## 2019-01-03 NOTE — TOC Initial Note (Signed)
Transition of Care [facility]) - Initial/Assessment Note    Patient Details  Name: [name] MRN: [id number] Date of Birth: 06/13/1933  Transition of Care [facility]) CM/SW Contact:    Nila Nephew, LCSW Phone Number: [phone] 01/03/2019, 3:19 PM  Clinical Narrative: Pt admitted from home where she resides alone (family nearby "but they work"). Admitted for pancreatitis. States she had [facility] PTA (see below). Expecting to return home unless SNF recommended and then she will be agreeable to pursuing short term rehab. Will follow. Completed High Risk Readmission screening, see below.                   Expected Discharge Plan: Home/Self Care(pt expecting to return home unless other care recommended) Barriers to Discharge: Continued Medical Work up   Patient Goals and CMS Choice Patient states their goals for this hospitalization and ongoing recovery are:: "treat everything" CMS Medicare.gov Compare Post Acute Care list provided to:: (n/a- will provide list to pt if appropriate based on planning)    Expected Discharge Plan and Services Expected Discharge Plan: Home/Self Care(pt expecting to return home unless other care recommended)     Living arrangements for the past 2 months: Apartment Expected Discharge Date: (unknown)                     [place]: [facility](had PTA )  Prior Living Arrangements/Services Living arrangements for the past 2 months: Apartment Lives with:: Self Patient language and need for interpreter reviewed:: No Do you feel safe going back to the place where you live?: Yes      Need for Family Participation in Patient Care: No (Comment) Care giver support system in place?: Yes (comment)(reports daughter and granddaughter supportive) Current home services: Home OT, Home PT(Home ST) Criminal Activity/Legal Involvement Pertinent to Current Situation/Hospitalization: No - Comment as needed  Activities of Daily Living Home Assistive Devices/Equipment:  Eyeglasses, Environmental consultant (specify type), Wheelchair, Shower chair with back, CBG Meter, Bedside commode/3-in-1(4 wheeled walker) ADL Screening (condition at time of admission) Patient's cognitive ability adequate to safely complete daily activities?: Yes Is the patient deaf or have difficulty hearing?: No Does the patient have difficulty seeing, even when wearing glasses/contacts?: No Does the patient have difficulty concentrating, remembering, or making decisions?: No Patient able to express need for assistance with ADLs?: Yes Does the patient have difficulty dressing or bathing?: Yes Independently performs ADLs?: No Communication: Independent Dressing (OT): Needs assistance Is this a change from baseline?: Pre-admission baseline Grooming: Needs assistance Is this a change from baseline?: Pre-admission baseline Feeding: Independent Bathing: Needs assistance Is this a change from baseline?: Pre-admission baseline Toileting: Needs assistance Is this a change from baseline?: Pre-admission baseline In/Out Bed: Needs assistance Is this a change from baseline?: Pre-admission baseline Walks in Home: Needs assistance Is this a change from baseline?: Pre-admission baseline Does the patient have difficulty walking or climbing stairs?: Yes Weakness of Legs: Both Weakness of Arms/Hands: Both  Permission Sought/Granted                  Emotional Assessment Appearance:: Appears stated age Attitude/Demeanor/Rapport: Engaged Affect (typically observed): Pleasant, Happy, Calm Orientation: : Oriented to Self, Oriented to Place, Oriented to  Time, Oriented to Situation Alcohol / Substance Use: Not Applicable Psych Involvement: No (comment)  Admission diagnosis:  Hyperglycemia [R73.9] Acute cystitis without hematuria [N30.00] Idiopathic acute pancreatitis without infection or necrosis [K85.00] Patient Active Problem List   Diagnosis Date Noted  . Acute pancreatitis 01/02/2019  .  Malignant  neoplasm of lower-inner quadrant of right breast of female, estrogen receptor positive ([place]) 11/23/2018  . Hematoma of right iliopsoas muscle 11/06/2018  . Intramuscular hematoma 11/06/2018  . Hypertensive heart disease with heart failure ([place]) 09/05/2017  . Aphasia 08/31/2017  . Dysphagia 08/31/2017  . CKD (chronic kidney disease), stage III ([place])   . LBBB (left bundle branch block)   . Anemia   . HTN (hypertension)   . Obesity   . DVT (deep venous thrombosis) ([place])   . DM (diabetes mellitus) ([place]) 07/28/2015  . Fall 07/28/2015  . Warfarin-induced coagulopathy ([place]) 07/28/2015  . Chronic diastolic heart failure ([place]) 03/19/2014  . Edema 08/06/2013  . SVT (supraventricular tachycardia) ([place]) 10/15/2012  . First degree AV block 10/15/2012  . Acute renal insufficiency 10/14/2012  . Dizziness and giddiness   . Thrombocytopenia ([place]) 11/21/2011   PCP:  [name], MD Pharmacy:   [facility] [id number] - [place], [place] - [address] AT [place] [address] [place] [address] Phone: [phone] Fax: [phone]  [id number] [place] [place] [address] Phone: [phone] Fax: [phone]     Social Determinants of Health (SDOH) Interventions    Readmission Risk Interventions  Readmission Risk Prevention Plan 01/03/2019 01/02/2019  Transportation Screening (No Data) Complete  PCP or Specialist Appt within 3-5 Days - Complete  HRI or [place] - Complete  Social Work Consult for [place] Planning/Counseling - Complete  Palliative Care Screening - Not Applicable  Medication Review Press photographer) - Complete  Some recent data might be hidden

## 2019-01-03 NOTE — Progress Notes (Signed)
Inpatient Diabetes Program Recommendations  AACE/ADA: New Consensus Statement on Inpatient Glycemic Control (2015)  Target Ranges:  Prepandial:   less than 140 mg/dL      Peak postprandial:   less than 180 mg/dL (1-2 hours)      Critically ill patients:  140 - 180 mg/dL   Lab Results  Component Value Date   GLUCAP 107 (H) 01/03/2019   HGBA1C 8.4 (H) 01/02/2019    Review of Glycemic Control  Diabetes history: DM2 Outpatient Diabetes medications: Levemir 16 units in am and 10 units QHS, Humalog 4-8 units tidwc Current orders for Inpatient glycemic control: Levemir 12 units bid, Novolog 0-9 units tidwc  Had hypo this am - 55 mg/dL.  Inpatient Diabetes Program Recommendations:     Decrease Levemir to 10 units bid.  Will continue to follow.   Thank you. Lorenda Peck, RD, LDN, CDE Inpatient Diabetes Coordinator [phone]

## 2019-01-04 ENCOUNTER — Ambulatory Visit: Payer: Self-pay | Admitting: *Deleted

## 2019-01-04 DIAGNOSIS — Z8249 Family history of ischemic heart disease and other diseases of the circulatory system: Secondary | ICD-10-CM | POA: Diagnosis not present

## 2019-01-04 DIAGNOSIS — I130 Hypertensive heart and chronic kidney disease with heart failure and stage 1 through stage 4 chronic kidney disease, or unspecified chronic kidney disease: Secondary | ICD-10-CM | POA: Diagnosis present

## 2019-01-04 DIAGNOSIS — M1990 Unspecified osteoarthritis, unspecified site: Secondary | ICD-10-CM | POA: Diagnosis present

## 2019-01-04 DIAGNOSIS — N3000 Acute cystitis without hematuria: Secondary | ICD-10-CM | POA: Diagnosis present

## 2019-01-04 DIAGNOSIS — Z886 Allergy status to analgesic agent status: Secondary | ICD-10-CM | POA: Diagnosis not present

## 2019-01-04 DIAGNOSIS — E1122 Type 2 diabetes mellitus with diabetic chronic kidney disease: Secondary | ICD-10-CM | POA: Diagnosis present

## 2019-01-04 DIAGNOSIS — R739 Hyperglycemia, unspecified: Secondary | ICD-10-CM | POA: Diagnosis present

## 2019-01-04 DIAGNOSIS — Z823 Family history of stroke: Secondary | ICD-10-CM | POA: Diagnosis not present

## 2019-01-04 DIAGNOSIS — D696 Thrombocytopenia, unspecified: Secondary | ICD-10-CM | POA: Diagnosis present

## 2019-01-04 DIAGNOSIS — N183 Chronic kidney disease, stage 3 (moderate): Secondary | ICD-10-CM | POA: Diagnosis present

## 2019-01-04 DIAGNOSIS — K859 Acute pancreatitis without necrosis or infection, unspecified: Secondary | ICD-10-CM | POA: Diagnosis not present

## 2019-01-04 DIAGNOSIS — I471 Supraventricular tachycardia: Secondary | ICD-10-CM | POA: Diagnosis present

## 2019-01-04 DIAGNOSIS — Z880 Allergy status to penicillin: Secondary | ICD-10-CM | POA: Diagnosis not present

## 2019-01-04 DIAGNOSIS — E1165 Type 2 diabetes mellitus with hyperglycemia: Secondary | ICD-10-CM | POA: Diagnosis present

## 2019-01-04 DIAGNOSIS — M48061 Spinal stenosis, lumbar region without neurogenic claudication: Secondary | ICD-10-CM | POA: Diagnosis present

## 2019-01-04 DIAGNOSIS — I447 Left bundle-branch block, unspecified: Secondary | ICD-10-CM | POA: Diagnosis present

## 2019-01-04 DIAGNOSIS — I5032 Chronic diastolic (congestive) heart failure: Secondary | ICD-10-CM | POA: Diagnosis present

## 2019-01-04 DIAGNOSIS — L8990 Pressure ulcer of unspecified site, unspecified stage: Secondary | ICD-10-CM

## 2019-01-04 DIAGNOSIS — K5900 Constipation, unspecified: Secondary | ICD-10-CM | POA: Diagnosis present

## 2019-01-04 DIAGNOSIS — Z853 Personal history of malignant neoplasm of breast: Secondary | ICD-10-CM | POA: Diagnosis not present

## 2019-01-04 DIAGNOSIS — Z803 Family history of malignant neoplasm of breast: Secondary | ICD-10-CM | POA: Diagnosis not present

## 2019-01-04 DIAGNOSIS — I340 Nonrheumatic mitral (valve) insufficiency: Secondary | ICD-10-CM | POA: Diagnosis present

## 2019-01-04 DIAGNOSIS — Z7901 Long term (current) use of anticoagulants: Secondary | ICD-10-CM | POA: Diagnosis not present

## 2019-01-04 DIAGNOSIS — I272 Pulmonary hypertension, unspecified: Secondary | ICD-10-CM | POA: Diagnosis present

## 2019-01-04 DIAGNOSIS — Z794 Long term (current) use of insulin: Secondary | ICD-10-CM | POA: Diagnosis not present

## 2019-01-04 DIAGNOSIS — Z86718 Personal history of other venous thrombosis and embolism: Secondary | ICD-10-CM | POA: Diagnosis not present

## 2019-01-04 LAB — GLUCOSE, CAPILLARY
Glucose-Capillary: 157 mg/dL — ABNORMAL HIGH (ref 70–99)
Glucose-Capillary: 189 mg/dL — ABNORMAL HIGH (ref 70–99)
Glucose-Capillary: 228 mg/dL — ABNORMAL HIGH (ref 70–99)
Glucose-Capillary: 115 mg/dL — ABNORMAL HIGH (ref 70–99)

## 2019-01-04 NOTE — Progress Notes (Signed)
PROGRESS NOTE    [name]  VVO:[id number] DOB: 1933/01/09 DOA: 01/01/2019 PCP: [name], MD   Brief Narrative: Patient is 85 year old female with a past medical history of diabetes, DVT, CKD stage III, hypertension, SVT, chronic diastolic CHF, chronic anemia emergency room for the evaluation of abdominal pain.  Found to have acute pancreatitis of unknown etiology.  Currently her abdominal pain has resolved.  Physical therapy recommended skilled facility on discharge.  Currently waiting for skilled specialty bed.  Assessment & Plan:   Active Problems:   Acute pancreatitis   Pressure injury of skin   Acute pancreatitis: Abdominal pain has significantly improved.  Minimally elevated lipase.  CT scan not specific for acute pancreatitis.  Abdominal imaging did not show any cholelithiasis or ductal dilation.  She is tolerating advanced diet.  Constipation: Continue bowel regimen  History of DVT: Continue Eliquis  History of SVT: Status post ablation.  Currently rate is controlled.  Continue amiodarone, Coreg  Uncontrolled diabetes mellitus: Continue current insulin regimen.  CKD stage III: Stable  Hypertension: Currently blood was stable.  Continue current regimen  Chronic diastolic heart failure: Currently euvolemic.  On Lasix, losartan, Coreg.  Last ejection fraction of 55%.  History of breast cancer: Follows with Dr. Lindi Adie.  On anastrozole.  History of nonobstructive nephrolithiasis: Currently stable  Moderate-severe multifactorial spinal canal stenosis: Chronic.  Physical therapy recommended skilled nursing facility on discharge.  Follow-up with neurosurgery as an outpatient.         DVT prophylaxis: Eliquis Code Status: Full Family Communication: Discussed with daughter on phone Disposition Plan: Skilled nursing facility as soon as the bed is available   Consultants: None  Procedures: None  Antimicrobials:  Anti-infectives (From admission, onward)    Start     Dose/Rate Route Frequency Ordered Stop   01/02/19 0045  cefTRIAXone (ROCEPHIN) 1 g in sodium chloride 0.9 % 100 mL IVPB     1 g 200 mL/hr over 30 Minutes Intravenous  Once 01/02/19 0030 01/02/19 0121      Subjective: Patient seen and examined the bedside this morning.  Tolerating advanced diet.  Still complains of some abdominal discomfort but is not bothering her.  No nausea or vomiting.  Has been having bowel movement.  Objective: Vitals:   01/03/19 1332 01/03/19 2123 01/04/19 0527 01/04/19 1255  BP: (!) 132/54 (!) 143/65 128/60 115/83  Pulse: 65 67 (!) 57 63  Resp: 18 18 20 18   Temp: 99.3 F (37.4 C) 100.1 F (37.8 C) 98.5 F (36.9 C) 99 F (37.2 C)  TempSrc: Oral Oral Oral Oral  SpO2: 98% 96% 99% 97%  Weight:      Height:        Intake/Output Summary (Last 24 hours) at 01/04/2019 1430 Last data filed at 01/04/2019 1300 Gross per 24 hour  Intake 2710.21 ml  Output 2700 ml  Net 10.21 ml   Filed Weights   01/01/19 1719  Weight: 89.4 kg    Examination:  General exam: Appears calm and comfortable ,Not in distress,obese HEENT:PERRL,Oral mucosa moist, Ear/Nose normal on gross exam Respiratory system: Bilateral equal air entry, normal vesicular breath sounds, no wheezes or crackles  Cardiovascular system: S1 & S2 heard, RRR. No JVD, murmurs, rubs, gallops or clicks. No pedal edema. Gastrointestinal system: Abdomen is nondistended, soft and nontender. No organomegaly or masses felt. Normal bowel sounds heard. Central nervous system: Alert and oriented. No focal neurological deficits. Extremities: No edema, no clubbing ,no cyanosis, distal peripheral pulses palpable. Skin: No  rashes, lesions or ulcers,no icterus ,no pallor MSK: Normal muscle bulk,tone ,power Psychiatry: Judgement and insight appear normal. Mood & affect appropriate.     Data Reviewed: I have personally reviewed following labs and imaging studies  CBC: Recent Labs  Lab 01/01/19 1723  WBC  5.4  HGB 10.6*  HCT 33.7*  MCV 94.7  PLT 366*   Basic Metabolic Panel: Recent Labs  Lab 01/01/19 1723  NA 135  K 4.3  CL 97*  CO2 28  GLUCOSE 443*  BUN 25*  CREATININE 1.46*  CALCIUM 8.8*   GFR: Estimated Creatinine Clearance: 32.3 mL/min (A) (by C-G formula based on SCr of 1.46 mg/dL (H)). Liver Function Tests: Recent Labs  Lab 01/01/19 1723  AST 24  ALT 26  ALKPHOS 83  BILITOT 0.7  PROT 7.6  ALBUMIN 3.4*   Recent Labs  Lab 01/01/19 1723  LIPASE 80*   No results for input(s): AMMONIA in the last 168 hours. Coagulation Profile: No results for input(s): INR, PROTIME in the last 168 hours. Cardiac Enzymes: No results for input(s): CKTOTAL, CKMB, CKMBINDEX, TROPONINI in the last 168 hours. BNP (last 3 results) Recent Labs    04/04/18 1025 07/06/18 1118 08/06/18 1244  PROBNP 1,189* 1,500* 1,604*   HbA1C: Recent Labs    01/02/19 1658  HGBA1C 8.4*   CBG: Recent Labs  Lab 01/03/19 1126 01/03/19 1555 01/03/19 2124 01/04/19 0743 01/04/19 1142  GLUCAP 221* 247* 225* 157* 189*   Lipid Profile: Recent Labs    01/02/19 1425  TRIG 56   Thyroid Function Tests: No results for input(s): TSH, T4TOTAL, FREET4, T3FREE, THYROIDAB in the last 72 hours. Anemia Panel: No results for input(s): VITAMINB12, FOLATE, FERRITIN, TIBC, IRON, RETICCTPCT in the last 72 hours. Sepsis Labs: No results for input(s): PROCALCITON, LATICACIDVEN in the last 168 hours.  Recent Results (from the past 240 hour(s))  Urine culture     Status: Abnormal   Collection Time: 01/01/19  5:23 PM  Result Value Ref Range Status   Specimen Description   Final    URINE, CLEAN CATCH Performed at [facility], [place] [address], [place], [place] [address]    Special Requests   Final    NONE Performed at [facility], [name] [address] [place], [place] [address]    Culture (A)  Final    <10,000 COLONIES/mL INSIGNIFICANT GROWTH Performed at [place] [address] [place], [place] [address]    Report Status 01/03/2019 FINAL  Final         Radiology Studies: US Abdomen Limited Ruq  Result Date: 01/03/2019 CLINICAL DATA:  Acute pancreatitis EXAM: ULTRASOUND ABDOMEN LIMITED RIGHT UPPER QUADRANT COMPARISON:  CT 01/02/2019 FINDINGS: Gallbladder: No gallstones or wall thickening visualized. Single wall thickness of the gallbladder is approximately 2.2 mm. No sonographic Murphy sign noted by sonographer. Common bile duct: Diameter: Normal for age at 8.2 mm Liver: No focal lesion identified. Within normal limits in parenchymal echogenicity. Portal vein is patent on color Doppler imaging with normal direction of blood flow towards the liver. Other: Small right effusion with trace amount of fluid about the right kidney and liver. IMPRESSION: No evidence of cholecystitis. Trace about the liver and right kidney may reflect stigmata of the patient's pancreatitis. Likewise, trace right effusion is seen. Electronically Signed   By: Ashley Royalty M.D.   On: 01/03/2019 20:45        Scheduled Meds: . amiodarone  200 mg Oral Daily  .  anastrozole  1 mg Oral Daily  . apixaban  5 mg Oral BID  . atorvastatin  20 mg Oral q1800  . calcium carbonate  1 tablet Oral Q breakfast  . carvedilol  12.5 mg Oral BID  . furosemide  80 mg Oral BID  . insulin aspart  0-9 Units Subcutaneous TID WC  . insulin detemir  10 Units Subcutaneous BID  . losartan  50 mg Oral Daily  . magnesium oxide  400 mg Oral Daily  . oxybutynin  5 mg Oral BID  . pantoprazole  40 mg Oral Daily  . potassium chloride  20 mEq Oral Daily  . pregabalin  75 mg Oral QHS   Continuous Infusions: . lactated ringers 100 mL/hr at 01/04/19 0546     LOS: 0 days    Time spent: 35 mins.More than 50% of that time was spent in counseling and/or coordination of care.      Shelly Coss, MD [facility] Pager [phone]  If 7PM-7AM, please contact  night-coverage [email or web address] Password [facility] 01/04/2019, 2:30 PM

## 2019-01-04 NOTE — Evaluation (Signed)
Physical Therapy Evaluation Patient Details Name: [name] MRN: [id number] DOB: 23-Dec-1932 Today's Date: 01/04/2019   History of Present Illness  85 y.o. female with a history of diabetes, DVT, CKD stage III, hypertension, SVT, chronic diastolic heart failure, chronic anemia.  Recent admission one month ago for R iliopsoas muscle hematoma and strain.  Patient presented secondary to abdominal pain was found to have acute pancreatitis  Clinical Impression  Pt admitted with above diagnosis. Pt currently with functional limitations due to the deficits listed below (see PT Problem List).  Pt will benefit from skilled PT to increase their independence and safety with mobility to allow discharge to the venue listed below.   Pt reports recent stay at SNF and prefers to d/c home however would be home alone during the day.  Pt only able to transfer to recliner and required at least mod assist.  Recommending SNF at this time.     Follow Up Recommendations SNF    Equipment Recommendations  None recommended by PT    Recommendations for Other Services       Precautions / Restrictions Precautions Precautions: Fall Precaution Comments: right breast collection device per pt - place gait belt low Restrictions Weight Bearing Restrictions: No      Mobility  Bed Mobility Overal bed mobility: Needs Assistance Bed Mobility: Supine to Sit     Supine to sit: Min guard;HOB elevated        Transfers Overall transfer level: Needs assistance Equipment used: Rolling walker (2 wheeled) Transfers: Sit to/from [facility] Sit to Stand: Min assist;From elevated surface Stand pivot transfers: Min assist       General transfer comment: verbal cues for hand placement, pt with difficulty with rise and steady requiring assist, uncontrolled descent on recliner required assist as well  Ambulation/Gait             General Gait Details: pt declined, feeling too weak  Stairs             Wheelchair Mobility    Modified Rankin (Stroke Patients Only)       Balance Overall balance assessment: Needs assistance         Standing balance support: Bilateral upper extremity supported Standing balance-Leahy Scale: Poor Standing balance comment: requires UE support                             Pertinent Vitals/Pain Pain Assessment: Faces Faces Pain Scale: Hurts a little bit Pain Location: chronic right knee (valgus deformity) Pain Descriptors / Indicators: Sore Pain Intervention(s): Repositioned;Monitored during session;Premedicated before session    Home Living Family/patient expects to be discharged to:: Unsure Living Arrangements: Children(family works, pt would be alone during day) Available Help at Discharge: Family;Available PRN/intermittently Type of Home: House Home Access: Level entry     Home Layout: One level Home Equipment: Walker - 4 wheels;Wheelchair - [employer] - 2 wheels Additional Comments: pt reports she recently moved in to her daughters home after finishing SNF rehab stay     Prior Function Level of Independence: Needs assistance   Gait / Transfers Assistance Needed: ambulatory with RW vs wheelchair  ADL's / Homemaking Assistance Needed: assistance for bathing, dressing        Hand Dominance        Extremity/Trunk Assessment        Lower Extremity Assessment Lower Extremity Assessment: RLE deficits/detail;Generalized weakness RLE Deficits / Details: significant knee valgus deformity  Communication   Communication: No difficulties  Cognition Arousal/Alertness: Awake/alert Behavior During Therapy: WFL for tasks assessed/performed Overall Cognitive Status: Within Functional Limits for tasks assessed                                        General Comments      Exercises     Assessment/Plan    PT Assessment Patient needs continued PT services  PT Problem List Decreased  strength;Decreased mobility;Decreased activity tolerance;Decreased knowledge of use of DME;Decreased knowledge of precautions;Decreased balance       PT Treatment Interventions DME instruction;Functional mobility training;Gait training;Therapeutic activities;Balance training;Stair training;Therapeutic exercise;Patient/family education    PT Goals (Current goals can be found in the Care Plan section)  Acute Rehab PT Goals PT Goal Formulation: With patient Time For Goal Achievement: 01/11/19 Potential to Achieve Goals: Good    Frequency Min 3X/week   Barriers to discharge        Co-evaluation               AM-PAC PT "6 Clicks" Mobility  Outcome Measure Help needed turning from your back to your side while in a flat bed without using bedrails?: A Little Help needed moving from lying on your back to sitting on the side of a flat bed without using bedrails?: A Little Help needed moving to and from a bed to a chair (including a wheelchair)?: A Lot Help needed standing up from a chair using your arms (e.g., wheelchair or bedside chair)?: A Lot Help needed to walk in hospital room?: A Lot Help needed climbing 3-5 steps with a railing? : Total 6 Click Score: 13    End of Session Equipment Utilized During Treatment: Gait belt Activity Tolerance: Patient tolerated treatment well Patient left: in chair;with call bell/phone within reach;with chair alarm set   PT Visit Diagnosis: Other abnormalities of gait and mobility (R26.89)    Time: 2035-5974 PT Time Calculation (min) (ACUTE ONLY): 20 min   Charges:   PT Evaluation $PT Eval Low Complexity: Cameron, PT, DPT Acute Rehabilitation Services Office: [phone] Pager: [phone]   Trena Platt 01/04/2019, 12:16 PM

## 2019-01-05 LAB — BASIC METABOLIC PANEL
Anion gap: 10 (ref 5–15)
BUN: 14 mg/dL (ref 8–23)
CO2: 30 mmol/L (ref 22–32)
Calcium: 8.2 mg/dL — ABNORMAL LOW (ref 8.9–10.3)
Chloride: 99 mmol/L (ref 98–111)
Creatinine, Ser: 1.19 mg/dL — ABNORMAL HIGH (ref 0.44–1.00)
GFR calc Af Amer: 49 mL/min — ABNORMAL LOW (ref >60)
GFR calc non Af Amer: 42 mL/min — ABNORMAL LOW (ref >60)
Glucose, Bld: 188 mg/dL — ABNORMAL HIGH (ref 70–99)
Potassium: 3.2 mmol/L — ABNORMAL LOW (ref 3.5–5.1)
Sodium: 139 mmol/L (ref 135–145)

## 2019-01-05 LAB — GLUCOSE, CAPILLARY
Glucose-Capillary: 119 mg/dL — ABNORMAL HIGH (ref 70–99)
Glucose-Capillary: 209 mg/dL — ABNORMAL HIGH (ref 70–99)
Glucose-Capillary: 210 mg/dL — ABNORMAL HIGH (ref 70–99)
Glucose-Capillary: 259 mg/dL — ABNORMAL HIGH (ref 70–99)

## 2019-01-05 MED ORDER — POTASSIUM CHLORIDE CRYS ER 20 MEQ PO TBCR
40.0000 meq | EXTENDED_RELEASE_TABLET | Freq: Every day | ORAL | Status: DC
  Administered 2019-01-06 – 2019-01-07 (×2): 40 meq via ORAL
  Filled 2019-01-05 (×3): qty 2

## 2019-01-05 MED ORDER — POTASSIUM CHLORIDE CRYS ER 20 MEQ PO TBCR
40.0000 meq | EXTENDED_RELEASE_TABLET | Freq: Once | ORAL | Status: AC
  Administered 2019-01-05: 40 meq via ORAL
  Filled 2019-01-05: qty 2

## 2019-01-05 NOTE — Progress Notes (Signed)
PROGRESS NOTE    [name]  IWL:[id number] DOB: 11-23-1932 DOA: 01/01/2019 PCP: [name], MD   Brief Narrative: Patient is 85 year old female with a past medical history of diabetes, DVT, CKD stage III, hypertension, SVT, chronic diastolic CHF, chronic anemia emergency room for the evaluation of abdominal pain.  Found to have acute pancreatitis of unknown etiology.  Currently her abdominal pain has resolved.  Physical therapy recommended skilled facility on discharge.  Currently waiting for skilled specialty bed.  Assessment & Plan:   Active Problems:   Acute pancreatitis   Pressure injury of skin   Acute pancreatitis: Abdominal pain has significantly improved.  Minimally elevated lipase.  CT scan not specific for acute pancreatitis.  Abdominal imaging did not show any cholelithiasis or ductal dilation.  She is tolerating advanced diet. She had some abdominal pain last night which improved this morning.  Constipation: Continue bowel regimen  History of DVT: Continue Eliquis  History of SVT: Status post ablation.  Currently rate is controlled.  Continue amiodarone, Coreg  Uncontrolled diabetes mellitus: Continue current insulin regimen.  CKD stage III: Stable  Hypertension: Currently blood was stable.  Continue current regimen  Chronic diastolic heart failure: Currently euvolemic.  On Lasix, losartan, Coreg.  Last ejection fraction of 55%.  Hypokalemia: Supplemented with potassium  History of breast cancer: Follows with Dr. Lindi Adie.  On anastrozole.  History of nonobstructive nephrolithiasis: Currently stable  Moderate-severe multifactorial spinal canal stenosis: Chronic.  Physical therapy recommended skilled nursing facility on discharge.  Follow-up with neurosurgery as an outpatient.         DVT prophylaxis: Eliquis Code Status: Full Family Communication: Discussed with daughter on phone on 01/04/19 Disposition Plan: Skilled nursing facility as soon as the bed  is available   Consultants: None  Procedures: None  Antimicrobials:  Anti-infectives (From admission, onward)   Start     Dose/Rate Route Frequency Ordered Stop   01/02/19 0045  cefTRIAXone (ROCEPHIN) 1 g in sodium chloride 0.9 % 100 mL IVPB     1 g 200 mL/hr over 30 Minutes Intravenous  Once 01/02/19 0030 01/02/19 0121      Subjective: Patient seen and examined the bedside this morning.  Hemodynamically stable.  Had some abdominal discomfort last night which is improved this morning.  Denies nausea or vomiting. Objective: Vitals:   01/04/19 0527 01/04/19 1255 01/04/19 2209 01/05/19 0530  BP: 128/60 115/83 (!) 147/72 135/68  Pulse: (!) 57 63 63 (!) 59  Resp: 20 18 20 16   Temp: 98.5 F (36.9 C) 99 F (37.2 C) 98.9 F (37.2 C) 98.5 F (36.9 C)  TempSrc: Oral Oral Oral Oral  SpO2: 99% 97% 100% 97%  Weight:      Height:        Intake/Output Summary (Last 24 hours) at 01/05/2019 1229 Last data filed at 01/05/2019 0600 Gross per 24 hour  Intake 1674.96 ml  Output 1250 ml  Net 424.96 ml   Filed Weights   01/01/19 1719  Weight: 89.4 kg    Examination:   General exam: Appears calm and comfortable ,Not in distress,obese HEENT:PERRL,Oral mucosa moist, Ear/Nose normal on gross exam Respiratory system: Bilateral equal air entry, normal vesicular breath sounds, no wheezes or crackles  Cardiovascular system: S1 & S2 heard, RRR. No JVD, murmurs, rubs, gallops or clicks. Gastrointestinal system: Abdomen is nondistended, soft and nontender. No organomegaly or masses felt. Normal bowel sounds heard. Central nervous system: Alert and oriented. No focal neurological deficits. Extremities: No edema, no clubbing ,  no cyanosis, distal peripheral pulses palpable. Skin: No rashes, lesions or ulcers,no icterus ,no pallor  Data Reviewed: I have personally reviewed following labs and imaging studies  CBC: Recent Labs  Lab 01/01/19 1723  WBC 5.4  HGB 10.6*  HCT 33.7*  MCV 94.7   PLT 631*   Basic Metabolic Panel: Recent Labs  Lab 01/01/19 1723 01/05/19 0325  NA 135 139  K 4.3 3.2*  CL 97* 99  CO2 28 30  GLUCOSE 443* 188*  BUN 25* 14  CREATININE 1.46* 1.19*  CALCIUM 8.8* 8.2*   GFR: Estimated Creatinine Clearance: 39.6 mL/min (A) (by C-G formula based on SCr of 1.19 mg/dL (H)). Liver Function Tests: Recent Labs  Lab 01/01/19 1723  AST 24  ALT 26  ALKPHOS 83  BILITOT 0.7  PROT 7.6  ALBUMIN 3.4*   Recent Labs  Lab 01/01/19 1723  LIPASE 80*   No results for input(s): AMMONIA in the last 168 hours. Coagulation Profile: No results for input(s): INR, PROTIME in the last 168 hours. Cardiac Enzymes: No results for input(s): CKTOTAL, CKMB, CKMBINDEX, TROPONINI in the last 168 hours. BNP (last 3 results) Recent Labs    04/04/18 1025 07/06/18 1118 08/06/18 1244  PROBNP 1,189* 1,500* 1,604*   HbA1C: Recent Labs    01/02/19 1658  HGBA1C 8.4*   CBG: Recent Labs  Lab 01/04/19 1142 01/04/19 1609 01/04/19 2232 01/05/19 0820 01/05/19 1223  GLUCAP 189* 228* 115* 119* 209*   Lipid Profile: Recent Labs    01/02/19 1425  TRIG 56   Thyroid Function Tests: No results for input(s): TSH, T4TOTAL, FREET4, T3FREE, THYROIDAB in the last 72 hours. Anemia Panel: No results for input(s): VITAMINB12, FOLATE, FERRITIN, TIBC, IRON, RETICCTPCT in the last 72 hours. Sepsis Labs: No results for input(s): PROCALCITON, LATICACIDVEN in the last 168 hours.  Recent Results (from the past 240 hour(s))  Urine culture     Status: Abnormal   Collection Time: 01/01/19  5:23 PM  Result Value Ref Range Status   Specimen Description   Final    URINE, CLEAN CATCH Performed at [facility], [place] [address] [place], [place] [address]    Special Requests   Final    NONE Performed at [facility], [place] [address], [place], [place] [address]    Culture (A)  Final    <10,000 COLONIES/mL INSIGNIFICANT GROWTH Performed at  [place] [address] [place], [place] [address]    Report Status 01/03/2019 FINAL  Final         Radiology Studies: US Abdomen Limited Ruq  Result Date: 01/03/2019 CLINICAL DATA:  Acute pancreatitis EXAM: ULTRASOUND ABDOMEN LIMITED RIGHT UPPER QUADRANT COMPARISON:  CT 01/02/2019 FINDINGS: Gallbladder: No gallstones or wall thickening visualized. Single wall thickness of the gallbladder is approximately 2.2 mm. No sonographic Murphy sign noted by sonographer. Common bile duct: Diameter: Normal for age at 8.2 mm Liver: No focal lesion identified. Within normal limits in parenchymal echogenicity. Portal vein is patent on color Doppler imaging with normal direction of blood flow towards the liver. Other: Small right effusion with trace amount of fluid about the right kidney and liver. IMPRESSION: No evidence of cholecystitis. Trace about the liver and right kidney may reflect stigmata of the patient's pancreatitis. Likewise, trace right effusion is seen. Electronically Signed   By: Ashley Royalty M.D.   On: 01/03/2019 20:45        Scheduled Meds: . amiodarone  200 mg Oral Daily  .  anastrozole  1 mg Oral Daily  . apixaban  5 mg Oral BID  . atorvastatin  20 mg Oral q1800  . calcium carbonate  1 tablet Oral Q breakfast  . carvedilol  12.5 mg Oral BID  . furosemide  80 mg Oral BID  . insulin aspart  0-9 Units Subcutaneous TID WC  . insulin detemir  10 Units Subcutaneous BID  . losartan  50 mg Oral Daily  . magnesium oxide  400 mg Oral Daily  . oxybutynin  5 mg Oral BID  . pantoprazole  40 mg Oral Daily  . potassium chloride  20 mEq Oral Daily  . pregabalin  75 mg Oral QHS   Continuous Infusions:    LOS: 1 day    Time spent: 35 mins.More than 50% of that time was spent in counseling and/or coordination of care.      Shelly Coss, MD [facility] Pager [phone]  If 7PM-7AM, please contact night-coverage [email or web address] Password [facility] 01/05/2019, 12:29  PM

## 2019-01-06 LAB — GLUCOSE, CAPILLARY
Glucose-Capillary: 129 mg/dL — ABNORMAL HIGH (ref 70–99)
Glucose-Capillary: 276 mg/dL — ABNORMAL HIGH (ref 70–99)
Glucose-Capillary: 292 mg/dL — ABNORMAL HIGH (ref 70–99)
Glucose-Capillary: 332 mg/dL — ABNORMAL HIGH (ref 70–99)

## 2019-01-06 LAB — BASIC METABOLIC PANEL
Anion gap: 10 (ref 5–15)
BUN: 19 mg/dL (ref 8–23)
CHLORIDE: 102 mmol/L (ref 98–111)
CO2: 28 mmol/L (ref 22–32)
Calcium: 8.2 mg/dL — ABNORMAL LOW (ref 8.9–10.3)
Creatinine, Ser: 1.38 mg/dL — ABNORMAL HIGH (ref 0.44–1.00)
GFR calc Af Amer: 41 mL/min — ABNORMAL LOW (ref >60)
GFR calc non Af Amer: 35 mL/min — ABNORMAL LOW (ref >60)
Glucose, Bld: 175 mg/dL — ABNORMAL HIGH (ref 70–99)
Potassium: 3.3 mmol/L — ABNORMAL LOW (ref 3.5–5.1)
Sodium: 140 mmol/L (ref 135–145)

## 2019-01-06 MED ORDER — POTASSIUM CHLORIDE CRYS ER 20 MEQ PO TBCR
40.0000 meq | EXTENDED_RELEASE_TABLET | Freq: Once | ORAL | Status: AC
  Administered 2019-01-06: 40 meq via ORAL
  Filled 2019-01-06: qty 2

## 2019-01-06 MED ORDER — FUROSEMIDE 40 MG PO TABS
40.0000 mg | ORAL_TABLET | Freq: Two times a day (BID) | ORAL | Status: DC
  Administered 2019-01-06 – 2019-01-07 (×2): 40 mg via ORAL
  Filled 2019-01-06 (×2): qty 1

## 2019-01-06 NOTE — Progress Notes (Signed)
PROGRESS NOTE    [name]  VPX:[id number] DOB: July 22, 1933 DOA: 01/01/2019 PCP: [name], MD   Brief Narrative: Patient is 85 year old female with a past medical history of diabetes, DVT, CKD stage III, hypertension, SVT, chronic diastolic CHF, chronic anemia emergency room for the evaluation of abdominal pain.  Found to have acute pancreatitis of unknown etiology.  Currently her abdominal pain has resolved.  Physical therapy recommended skilled facility on discharge.  Currently waiting for skilled nursing facility bed.SW consulted.  Assessment & Plan:   Active Problems:   Acute pancreatitis   Pressure injury of skin   Acute pancreatitis: Abdominal pain has resolved.  Minimally elevated lipase.  CT scan not specific for acute pancreatitis.  Abdominal imaging did not show any cholelithiasis or ductal dilation.  She is tolerating advanced diet.  Constipation: Continue bowel regimen  History of DVT: Continue Eliquis  History of SVT: Status post ablation.  Currently rate is controlled.  Continue amiodarone, Coreg  Uncontrolled diabetes mellitus: Continue current insulin regimen.  CKD stage III: Creatinine bumped up today.Will taper lasix to 40 mg BID.  Hypertension: Currently blood was stable.  Continue current regimen  Chronic diastolic heart failure: Currently euvolemic.  On Lasix, losartan, Coreg.  Last ejection fraction of 55%. Follows with cardiology.  On Lasix 80 mg twice a day at home.  Will taper lasix to 40 mg BID.  Hypokalemia: Being supplemented with potassium  History of breast cancer: Follows with Dr. Lindi Adie.  On anastrozole.  History of nonobstructive nephrolithiasis: Currently stable  Moderate-severe multifactorial spinal canal stenosis: Chronic.  Physical therapy recommended skilled nursing facility on discharge.  Follow-up with neurosurgery as an outpatient.         DVT prophylaxis: Eliquis Code Status: Full Family Communication: Discussed with  daughter on phone on 01/04/19 Disposition Plan: Skilled nursing facility as soon as the bed is available   Consultants: None  Procedures: None  Antimicrobials:  Anti-infectives (From admission, onward)   Start     Dose/Rate Route Frequency Ordered Stop   01/02/19 0045  cefTRIAXone (ROCEPHIN) 1 g in sodium chloride 0.9 % 100 mL IVPB     1 g 200 mL/hr over 30 Minutes Intravenous  Once 01/02/19 0030 01/02/19 0121      Subjective: Patient seen and examined the bedside this morning.  Remains comfortable.  Hemodynamically stable.  No active issues.  Objective: Vitals:   01/05/19 0530 01/05/19 1500 01/05/19 2230 01/06/19 0539  BP: 135/68 107/71 (!) 126/53 131/63  Pulse: (!) 59 60 68 65  Resp: 16 16 15 15   Temp: 98.5 F (36.9 C) 99.6 F (37.6 C) 99.6 F (37.6 C) 99.3 F (37.4 C)  TempSrc: Oral Oral Oral Oral  SpO2: 97% 96% 95% 93%  Weight:      Height:        Intake/Output Summary (Last 24 hours) at 01/06/2019 1130 Last data filed at 01/06/2019 0500 Gross per 24 hour  Intake -  Output 700 ml  Net -700 ml   Filed Weights   01/01/19 1719  Weight: 89.4 kg    Examination:   General exam: Appears calm and comfortable ,Not in distress,average built HEENT:PERRL,Oral mucosa moist, Ear/Nose normal on gross exam Respiratory system: Bilateral equal air entry, normal vesicular breath sounds, no wheezes or crackles  Cardiovascular system: S1 & S2 heard, RRR. No JVD, murmurs, rubs, gallops or clicks. Gastrointestinal system: Abdomen is nondistended, soft and nontender. No organomegaly or masses felt. Normal bowel sounds heard. Central nervous system: Alert  and oriented. No focal neurological deficits. Extremities: No edema, no clubbing ,no cyanosis, distal peripheral pulses palpable. Skin: No rashes, lesions or ulcers,no icterus ,no pallor  Data Reviewed: I have personally reviewed following labs and imaging studies  CBC: Recent Labs  Lab 01/01/19 1723  WBC 5.4  HGB 10.6*   HCT 33.7*  MCV 94.7  PLT 008*   Basic Metabolic Panel: Recent Labs  Lab 01/01/19 1723 01/05/19 0325 01/06/19 0457  NA 135 139 140  K 4.3 3.2* 3.3*  CL 97* 99 102  CO2 28 30 28   GLUCOSE 443* 188* 175*  BUN 25* 14 19  CREATININE 1.46* 1.19* 1.38*  CALCIUM 8.8* 8.2* 8.2*   GFR: Estimated Creatinine Clearance: 34.1 mL/min (A) (by C-G formula based on SCr of 1.38 mg/dL (H)). Liver Function Tests: Recent Labs  Lab 01/01/19 1723  AST 24  ALT 26  ALKPHOS 83  BILITOT 0.7  PROT 7.6  ALBUMIN 3.4*   Recent Labs  Lab 01/01/19 1723  LIPASE 80*   No results for input(s): AMMONIA in the last 168 hours. Coagulation Profile: No results for input(s): INR, PROTIME in the last 168 hours. Cardiac Enzymes: No results for input(s): CKTOTAL, CKMB, CKMBINDEX, TROPONINI in the last 168 hours. BNP (last 3 results) Recent Labs    04/04/18 1025 07/06/18 1118 08/06/18 1244  PROBNP 1,189* 1,500* 1,604*   HbA1C: No results for input(s): HGBA1C in the last 72 hours. CBG: Recent Labs  Lab 01/05/19 0820 01/05/19 1223 01/05/19 1752 01/05/19 2231 01/06/19 0823  GLUCAP 119* 209* 210* 259* 129*   Lipid Profile: No results for input(s): CHOL, HDL, LDLCALC, TRIG, CHOLHDL, LDLDIRECT in the last 72 hours. Thyroid Function Tests: No results for input(s): TSH, T4TOTAL, FREET4, T3FREE, THYROIDAB in the last 72 hours. Anemia Panel: No results for input(s): VITAMINB12, FOLATE, FERRITIN, TIBC, IRON, RETICCTPCT in the last 72 hours. Sepsis Labs: No results for input(s): PROCALCITON, LATICACIDVEN in the last 168 hours.  Recent Results (from the past 240 hour(s))  Urine culture     Status: Abnormal   Collection Time: 01/01/19  5:23 PM  Result Value Ref Range Status   Specimen Description   Final    URINE, CLEAN CATCH Performed at [facility], [place] [address] [place], [place] [address]    Special Requests   Final    NONE Performed at [facility],  [place] [address], [place], [place] [address]    Culture (A)  Final    <10,000 COLONIES/mL INSIGNIFICANT GROWTH Performed at [place] [address], [place], [place] [address]    Report Status 01/03/2019 FINAL  Final         Radiology Studies: No results found.      Scheduled Meds: . amiodarone  200 mg Oral Daily  . anastrozole  1 mg Oral Daily  . apixaban  5 mg Oral BID  . atorvastatin  20 mg Oral q1800  . calcium carbonate  1 tablet Oral Q breakfast  . carvedilol  12.5 mg Oral BID  . furosemide  40 mg Oral BID  . insulin aspart  0-9 Units Subcutaneous TID WC  . insulin detemir  10 Units Subcutaneous BID  . losartan  50 mg Oral Daily  . magnesium oxide  400 mg Oral Daily  . oxybutynin  5 mg Oral BID  . pantoprazole  40 mg Oral Daily  . potassium chloride  40 mEq Oral Daily  . potassium chloride  40 mEq Oral  Once  . pregabalin  75 mg Oral QHS   Continuous Infusions:    LOS: 2 days    Time spent: 25 mins.More than 50% of that time was spent in counseling and/or coordination of care.      Shelly Coss, MD [facility] Pager [phone]  If 7PM-7AM, please contact night-coverage [email or web address] Password [email or web address] 01/06/2019, 11:30 AM

## 2019-01-07 ENCOUNTER — Other Ambulatory Visit: Payer: Self-pay | Admitting: *Deleted

## 2019-01-07 ENCOUNTER — Telehealth: Payer: Self-pay

## 2019-01-07 LAB — BASIC METABOLIC PANEL
Anion gap: 8 (ref 5–15)
BUN: 20 mg/dL (ref 8–23)
CO2: 26 mmol/L (ref 22–32)
CREATININE: 1.3 mg/dL — AB (ref 0.44–1.00)
Calcium: 8.2 mg/dL — ABNORMAL LOW (ref 8.9–10.3)
Chloride: 106 mmol/L (ref 98–111)
GFR calc Af Amer: 44 mL/min — ABNORMAL LOW (ref >60)
GFR calc non Af Amer: 38 mL/min — ABNORMAL LOW (ref >60)
Glucose, Bld: 206 mg/dL — ABNORMAL HIGH (ref 70–99)
Potassium: 3.8 mmol/L (ref 3.5–5.1)
Sodium: 140 mmol/L (ref 135–145)

## 2019-01-07 LAB — GLUCOSE, CAPILLARY
Glucose-Capillary: 266 mg/dL — ABNORMAL HIGH (ref 70–99)
Glucose-Capillary: 197 mg/dL — ABNORMAL HIGH (ref 70–99)
Glucose-Capillary: 309 mg/dL — ABNORMAL HIGH (ref 70–99)

## 2019-01-07 MED ORDER — FUROSEMIDE 80 MG PO TABS: 40.0000 mg | ORAL_TABLET | Freq: Two times a day (BID) | ORAL | 9 refills | Status: DC

## 2019-01-07 MED ORDER — POTASSIUM CHLORIDE CRYS ER 20 MEQ PO TBCR: 40.0000 meq | EXTENDED_RELEASE_TABLET | Freq: Every day | ORAL | 0 refills | Status: DC

## 2019-01-07 NOTE — TOC Transition Note (Signed)
Transition of Care [facility]) - CM/SW Discharge Note   Patient Details  Name: [name] MRN: [id number] Date of Birth: 1932-11-21  Transition of Care [facility]) CM/SW Contact:  Nila Nephew, LCSW Phone Number: [phone] 01/07/2019, 2:31 PM   Clinical Narrative:    Pt returning home with support and care of family at DC today. Arranged DME (walker, WC, BC through [employer]). Pt's granddaughter transporting her home.    Final next level of care: Home/Self Care Barriers to Discharge: No Barriers Identified   Patient Goals and CMS Choice Patient states their goals for this hospitalization and ongoing recovery are:: "treat everything" CMS Medicare.gov Compare Post Acute Care list provided to:: Patient Choice offered to / list presented to : Adult Children  Discharge Placement                  Name of family member notified: daughter [name]    Discharge Plan and Services                    [facility] Agency: [facility](had PTA )   Social Determinants of Health (SDOH) Interventions     Readmission Risk Interventions Readmission Risk Prevention Plan 01/03/2019 01/02/2019  Transportation Screening (No Data) Complete  PCP or Specialist Appt within 3-5 Days - Complete  HRI or [place] - Complete  Social Work Consult for [place] Planning/Counseling - Complete  Palliative Care Screening - Not Applicable  Medication Review [employer] photographer) - Complete  Some recent data might be hidden

## 2019-01-07 NOTE — Consult Note (Signed)
   [facility] CM Inpatient Consult   01/07/2019  BARRY CULVERHOUSE Oct 07, 1933 [id number]   Patient chart reviewed for medium risk score, 21%, for unplanned readmissions and 2 hospitalizations within the past 6 months. Patient is currently active with [facility] for chronic disease management services.  Patient has been engaged by a [facility].    Spoke with inpatient RNCM. Patient to transition to home and plan is to go to [place] with her family. Will make community RN aware of patient disposition.  Of note, [facility] Care Management services does not replace or interfere with any services that are needed or arranged by inpatient case management or social work.    Netta Cedars, MSN, [facility] Liaison Nurse Mobile Phone [phone]  Toll free office [phone]

## 2019-01-07 NOTE — Patient Outreach (Signed)
[facility]) Care Management  01/07/2019  [name] [birth year] [id number]   Hospital liaison reports pt will discharge to [place] and stay with family as she will continue to recover. Case will be closed as this does not meet [facility] criteria for ongoing [facility] community case management services. Will alert her provider with pt's disposition with [facility].   Raina Mina, RN Care Management Coordinator [place] Office [phone]

## 2019-01-07 NOTE — Progress Notes (Signed)
Inpatient Diabetes Program Recommendations  AACE/ADA: New Consensus Statement on Inpatient Glycemic Control (2015)  Target Ranges:  Prepandial:   less than 140 mg/dL      Peak postprandial:   less than 180 mg/dL (1-2 hours)      Critically ill patients:  140 - 180 mg/dL   Lab Results  Component Value Date   GLUCAP 197 (H) 01/07/2019   HGBA1C 8.4 (H) 01/02/2019    Review of Glycemic Control  Diabetes history: DM2 Outpatient Diabetes medications: Levemir 16 units in am and 10 units QHS, Humalog 4-8 units tidwc Current orders for Inpatient glycemic control: Levemir 10 units bid, Novolog 0-9 units tidwc  Inpatient Diabetes Program Recommendations:   Noted postprandial CBGs elevated. -Add Novolog 3 units tid meal coverage if eats 50%  Thank you, Bethena Roys E. Elkin Belfield, RN, MSN, CDE  Diabetes Coordinator Inpatient Glycemic Control Team Team Pager [phone] (8am-5pm) 01/07/2019 8:57 AM

## 2019-01-07 NOTE — Care Management Important Message (Signed)
Important Message  Patient Details  Name: [name] MRN: [id number] Date of Birth: 1933/04/26   Medicare Important Message Given:  Yes    Kerin Salen 01/07/2019, 11:56 AMImportant Message  Patient Details  Name: [name] MRN: [id number] Date of Birth: 1933/03/04   Medicare Important Message Given:  Yes    Kerin Salen 01/07/2019, 11:56 AM

## 2019-01-07 NOTE — Discharge Summary (Addendum)
Physician Discharge Summary  [name] DPO:[id number] DOB: 07/26/1933 DOA: 01/01/2019  PCP: [name], MD  Admit date: 01/01/2019 Discharge date: 01/07/2019  Admitted From: Home Disposition:  Home  Discharge Condition:Stable CODE STATUS:FULL Diet recommendation: Heart Healthy  Brief/Interim Summary: Patient is 85 year old female with a past medical history of diabetes, DVT, CKD stage III, hypertension, SVT, chronic diastolic CHF, chronic anemia emergency room for the evaluation of abdominal pain.  Found to have acute pancreatitis of unknown etiology.  Started on conservative management with IV fluids, n.p.o. status. Currently her abdominal pain has resolved.  Physical therapy recommended skilled facility on discharge.    Family wants to take her home so she is being discharged to home today.  Following problems were addressed during her hospitalization:  Acute pancreatitis: Abdominal pain has resolved.  Minimally elevated lipase.  CT scan not specific for acute pancreatitis.  Abdominal imaging did not show any cholelithiasis or ductal dilation.  She is tolerating advanced diet.  Constipation: Continue bowel regimen  History of DVT: Continue Eliquis  History of SVT: Status post ablation.  Currently rate is controlled.  Continue amiodarone, Coreg  Uncontrolled diabetes mellitus: Continue home insulin regimen.  CKD stage III: Creatinine around baseline .Will taper lasix to 40 mg BID.  Hypertension: Currently blood was stable.  Continue current regimen  Chronic diastolic heart failure: Currently euvolemic.  On Lasix, losartan, Coreg.  Last ejection fraction of 55%. Follows with cardiology.  On Lasix 80 mg twice a day at home.  Will taper lasix to 40 mg BID.  Hypokalemia: Being supplemented with potassium  History of breast cancer: Follows with Dr. Lindi Adie.  On anastrozole.  History of nonobstructive nephrolithiasis: Currently stable  Moderate-severe multifactorial  spinal canal stenosis: Chronic.  Physical therapy recommended skilled nursing facility on discharge.Granddaughter wants her to take her home.  Follow-up with neurosurgery as an outpatient. 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  A walker will not resolve  issue with performing activities of daily living. A wheelchair will allow patient to safely perform daily activities. Patient can safely propel the wheelchair in the home or has a caregiver who can provide assistance.  Accessories: elevating leg rests (ELRs), wheel locks, extensions and anti-tippers.     Discharge Diagnoses:  Active Problems:   Acute pancreatitis   Pressure injury of skin    Discharge Instructions  Discharge Instructions    Diet - low sodium heart healthy   Complete by:  As directed    Discharge instructions   Complete by:  As directed    1) Take prescribed medications as instructed. 2)Do  CBC and BMP tests in a week. 3)Follow up with your PCP in 2 weeks. 4)Monitor your blood sugars at home. 5)Follow up with your cardiologist.   Increase activity slowly   Complete by:  As directed      Allergies as of 01/07/2019      Reactions   Aspirin Itching   Penicillins Itching, Rash   DID THE REACTION INVOLVE: Swelling of the face/tongue/throat, SOB, or low BP? y Sudden or severe rash/hives, skin peeling, or the inside of the mouth or nose? n Did it require medical treatment? yes When did it last happen?more than 10 years If all above answers are "NO", may proceed with cephalosporin use.      Medication List    STOP taking these medications   potassium chloride 10 MEQ  CR capsule Commonly known as:  MICRO-K Replaced by:  potassium chloride SA 20 MEQ tablet     TAKE these medications   Accu-Chek Aviva Plus test strip Generic drug:  glucose blood USE AS DIRECTED TO CHECK BLOOD SUGAR QID    acetaminophen 500 MG tablet Commonly known as:  TYLENOL Take 1,000 mg by mouth every 6 (six) hours as needed.   amiodarone 200 MG tablet Commonly known as:  PACERONE Take 1 tablet (200 mg total) by mouth daily.   anastrozole 1 MG tablet Commonly known as:  ARIMIDEX Take 1 tablet (1 mg total) by mouth daily.   apixaban 5 MG Tabs tablet Commonly known as:  ELIQUIS Take 1 tablet (5 mg total) by mouth 2 (two) times daily.   atorvastatin 20 MG tablet Commonly known as:  LIPITOR TAKE 1 TABLET BY MOUTH AT NIGHT What changed:  when to take this   BD Pen Needle Nano U/F 32G X 4 MM Misc Generic drug:  Insulin Pen Needle Use as directed   calcium carbonate 600 MG Tabs tablet Commonly known as:  OS-CAL Take 600 mg by mouth daily with breakfast.   carvedilol 12.5 MG tablet Commonly known as:  COREG Take 1 tablet (12.5 mg total) by mouth 2 (two) times daily.   diclofenac sodium 1 % Gel Commonly known as:  Voltaren Apply 2 g topically 4 (four) times daily.   furosemide 80 MG tablet Commonly known as:  LASIX Take 0.5 tablets (40 mg total) by mouth 2 (two) times daily. What changed:  how much to take   HumaLOG KwikPen 200 UNIT/ML Sopn Generic drug:  Insulin Lispro Inject 4-8 Units into the skin 3 (three) times daily. Per sliding scale   Levemir FlexTouch 100 UNIT/ML Pen Generic drug:  Insulin Detemir Inject 10-16 Units into the skin daily. 16 units in the am and 10 units in the evening   losartan 100 MG tablet Commonly known as:  COZAAR TAKE 1/2 TABLET(50 MG) BY MOUTH DAILY What changed:  See the new instructions.   magnesium oxide 400 MG tablet Commonly known as:  MAG-OX Take 400 mg by mouth daily.   metoprolol succinate 50 MG 24 hr tablet Commonly known as:  TOPROL-XL Take 50 mg by mouth daily.   omeprazole 40 MG capsule Commonly known as:  PRILOSEC TAKE ONE CAPSULE BY MOUTH DAILY BEFORE A MEAL What changed:  See the new instructions.   oxybutynin 5 MG  tablet Commonly known as:  DITROPAN Take 1 tablet (5 mg total) by mouth 2 (two) times daily.   potassium chloride SA 20 MEQ tablet Commonly known as:  K-DUR,KLOR-CON Take 2 tablets (40 mEq total) by mouth daily. Start taking on:  January 08, 2019 Replaces:  potassium chloride 10 MEQ CR capsule   pregabalin 75 MG capsule Commonly known as:  LYRICA Take 75 mg by mouth at bedtime.   traMADol 50 MG tablet Commonly known as:  ULTRAM Take 50 mg by mouth 2 (two) times daily as needed for pain.      Follow-up Information    [name], MD. Schedule an appointment as soon as possible for a visit in 1 week(s).   Specialty:  Internal Medicine Contact information: [address] [place] [address] [phone]        Jettie Booze, MD .   Specialties:  Cardiology, Radiology, Interventional Cardiology Contact information: [address] [place] [address] [phone]  Allergies  Allergen Reactions  . Aspirin Itching  . Penicillins Itching and Rash    DID THE REACTION INVOLVE: Swelling of the face/tongue/throat, SOB, or low BP? y Sudden or severe rash/hives, skin peeling, or the inside of the mouth or nose? n Did it require medical treatment? yes When did it last happen?more than 10 years If all above answers are "NO", may proceed with cephalosporin use.     Consultations:  None   Procedures/Studies: Ct Abdomen Pelvis Wo Contrast  Result Date: 01/02/2019 CLINICAL DATA:  85 y/o F; mid abdominal pain, nausea, and constipation for 4 days. EXAM: CT ABDOMEN AND PELVIS WITHOUT CONTRAST TECHNIQUE: Multidetector CT imaging of the abdomen and pelvis was performed following the standard protocol without IV contrast. COMPARISON:  11/06/2018 pelvic CT. 08/01/2015 CT abdomen and pelvis. 02/08/2017 lumbar spine MRI. FINDINGS: Lower chest: 2-3 mm scattered nodules in the lung bases are stable compatible with benign etiology.  Small hiatal hernia. Hepatobiliary: No focal liver abnormality is seen. No gallstones, gallbladder wall thickening, or biliary dilatation. Pancreas: Pancreas is diffusely ill-defined with surrounding edema. Spleen: Normal in size without focal abnormality. Adrenals/Urinary Tract: Normal adrenal glands. Multiple nonobstructing kidney stones bilaterally measuring up to 4 mm in the lower pole of left kidney. No hydronephrosis. Normal bladder. Stomach/Bowel: Stomach is within normal limits. Appendix appears normal. No evidence of bowel wall thickening, distention, or inflammatory changes. Vascular/Lymphatic: No significant vascular findings are present. No enlarged abdominal or pelvic lymph nodes. Reproductive: Status post hysterectomy. No adnexal masses. Other: No abdominal wall hernia or abnormality. No abdominopelvic ascites. Musculoskeletal: No fracture is seen. L4-5 stable grade 1 anterolisthesis. Progression of L2-3 degenerative changes with endplate eburnation, loss of disc space height, and vacuum phenomenon. Moderate to severe multifactorial spinal canal stenosis at L2-3 and L4-5. IMPRESSION: 1. Diffusely ill-defined pancreas with surrounding edema, likely acute pancreatitis. No discrete acute peripancreatic collection identified on this noncontrast examination. 2. Bilateral nonobstructing kidney stones. 3. Small hiatal hernia. 4. Progression of lumbar degenerative changes with moderate to severe multifactorial spinal canal stenosis at L2-3 and L4-5. Electronically Signed   By: Kristine Garbe M.D.   On: 01/02/2019 01:16   US Abdomen Limited Ruq  Result Date: 01/03/2019 CLINICAL DATA:  Acute pancreatitis EXAM: ULTRASOUND ABDOMEN LIMITED RIGHT UPPER QUADRANT COMPARISON:  CT 01/02/2019 FINDINGS: Gallbladder: No gallstones or wall thickening visualized. Single wall thickness of the gallbladder is approximately 2.2 mm. No sonographic Murphy sign noted by sonographer. Common bile duct: Diameter: Normal  for age at 8.2 mm Liver: No focal lesion identified. Within normal limits in parenchymal echogenicity. Portal vein is patent on color Doppler imaging with normal direction of blood flow towards the liver. Other: Small right effusion with trace amount of fluid about the right kidney and liver. IMPRESSION: No evidence of cholecystitis. Trace about the liver and right kidney may reflect stigmata of the patient's pancreatitis. Likewise, trace right effusion is seen. Electronically Signed   By: Ashley Royalty M.D.   On: 01/03/2019 20:45       Subjective: Patient seen and examined the bedside this morning.  Remains comfortable.  Hemodynamically stable for discharge today.  Discharge Exam: Vitals:   01/06/19 2133 01/07/19 0454  BP: (!) 143/63 122/62  Pulse: 63 60  Resp: 16 16  Temp: 99.2 F (37.3 C) 98.1 F (36.7 C)  SpO2: 96% 96%   Vitals:   01/06/19 0539 01/06/19 1400 01/06/19 2133 01/07/19 0454  BP: 131/63 111/67 (!) 143/63 122/62  Pulse: 65 60 63 60  Resp: 15 16 16 16   Temp: 99.3 F (37.4 C) 98.8 F (37.1 C) 99.2 F (37.3 C) 98.1 F (36.7 C)  TempSrc: Oral Oral Oral Oral  SpO2: 93% 98% 96% 96%  Weight:      Height:        General: Pt is alert, awake, not in acute distress Cardiovascular: RRR, S1/S2 +, no rubs, no gallops Respiratory: CTA bilaterally, no wheezing, no rhonchi Abdominal: Soft, NT, ND, bowel sounds + Extremities: no edema, no cyanosis    The results of significant diagnostics from this hospitalization (including imaging, microbiology, ancillary and laboratory) are listed below for reference.     Microbiology: Recent Results (from the past 240 hour(s))  Urine culture     Status: Abnormal   Collection Time: 01/01/19  5:23 PM  Result Value Ref Range Status   Specimen Description   Final    URINE, CLEAN CATCH Performed at [facility], [place] [address], [place], [place] [address]    Special Requests   Final    NONE Performed at [facility], [place] [address], [place], [place] [address]    Culture (A)  Final    <10,000 COLONIES/mL INSIGNIFICANT GROWTH Performed at [place] [address] [place], [place] [address]    Report Status 01/03/2019 FINAL  Final     Labs: BNP (last 3 results) No results for input(s): BNP in the last 8760 hours. Basic Metabolic Panel: Recent Labs  Lab 01/01/19 1723 01/05/19 0325 01/06/19 0457 01/07/19 0449  NA 135 139 140 140  K 4.3 3.2* 3.3* 3.8  CL 97* 99 102 106  CO2 28 30 28 26   GLUCOSE 443* 188* 175* 206*  BUN 25* 14 19 20   CREATININE 1.46* 1.19* 1.38* 1.30*  CALCIUM 8.8* 8.2* 8.2* 8.2*   Liver Function Tests: Recent Labs  Lab 01/01/19 1723  AST 24  ALT 26  ALKPHOS 83  BILITOT 0.7  PROT 7.6  ALBUMIN 3.4*   Recent Labs  Lab 01/01/19 1723  LIPASE 80*   No results for input(s): AMMONIA in the last 168 hours. CBC: Recent Labs  Lab 01/01/19 1723  WBC 5.4  HGB 10.6*  HCT 33.7*  MCV 94.7  PLT 122*   Cardiac Enzymes: No results for input(s): CKTOTAL, CKMB, CKMBINDEX, TROPONINI in the last 168 hours. BNP: Invalid input(s): POCBNP CBG: Recent Labs  Lab 01/06/19 1623 01/06/19 2139 01/07/19 0220 01/07/19 0752 01/07/19 1121  GLUCAP 292* 332* 266* 197* 309*   D-Dimer No results for input(s): DDIMER in the last 72 hours. Hgb A1c No results for input(s): HGBA1C in the last 72 hours. Lipid Profile No results for input(s): CHOL, HDL, LDLCALC, TRIG, CHOLHDL, LDLDIRECT in the last 72 hours. Thyroid function studies No results for input(s): TSH, T4TOTAL, T3FREE, THYROIDAB in the last 72 hours.  Invalid input(s): FREET3 Anemia work up No results for input(s): VITAMINB12, FOLATE, FERRITIN, TIBC, IRON, RETICCTPCT in the last 72 hours. Urinalysis    Component Value Date/Time   COLORURINE YELLOW 01/01/2019 1723   APPEARANCEUR HAZY (A) 01/01/2019 1723   LABSPEC 1.009 01/01/2019 1723   PHURINE 6.0 01/01/2019 1723   GLUCOSEU >=500 (A)  01/01/2019 1723   HGBUR SMALL (A) 01/01/2019 1723   BILIRUBINUR NEGATIVE 01/01/2019 1723   KETONESUR 5 (A) 01/01/2019 1723   PROTEINUR NEGATIVE 01/01/2019 1723   UROBILINOGEN 0.2 10/12/2012 2037   NITRITE NEGATIVE 01/01/2019 1723   LEUKOCYTESUR LARGE (A) 01/01/2019 1723   Sepsis Labs Invalid  input(s): PROCALCITONIN,  WBC,  LACTICIDVEN Microbiology Recent Results (from the past 240 hour(s))  Urine culture     Status: Abnormal   Collection Time: 01/01/19  5:23 PM  Result Value Ref Range Status   Specimen Description   Final    URINE, CLEAN CATCH Performed at [facility], [place] [address], [place], [place] [address]    Special Requests   Final    NONE Performed at [facility], [place] [address], [place], [place] [address]    Culture (A)  Final    <10,000 COLONIES/mL INSIGNIFICANT GROWTH Performed at [place] [address], [place], [place] [address]    Report Status 01/03/2019 FINAL  Final    Please note: You were cared for by a hospitalist during your hospital stay. Once you are discharged, your primary care physician will handle any further medical issues. Please note that NO REFILLS for any discharge medications will be authorized once you are discharged, as it is imperative that you return to your primary care physician (or establish a relationship with a primary care physician if you do not have one) for your post hospital discharge needs so that they can reassess your need for medications and monitor your lab values.    Time coordinating discharge: 40 minutes  SIGNED:   Shelly Coss, MD  [facility] 01/07/2019, 12:52 PM Pager [phone]  If 7PM-7AM, please contact night-coverage [email or web address] Password [email or web address]

## 2019-01-07 NOTE — Progress Notes (Signed)
Physical Therapy Treatment Patient Details Name: [name] MRN: [id number] DOB: Jun 25, 1933 Today's Date: 01/07/2019    History of Present Illness 85 y.o. female with a history of diabetes, DVT, CKD stage III, hypertension, SVT, chronic diastolic heart failure, chronic anemia.  Recent admission one month ago for R iliopsoas muscle hematoma and strain.  Patient presented secondary to abdominal pain was found to have acute pancreatitis    PT Comments    Pt feeling better.  Tolerated an increased amb distance.  Able to self rise OOB and perform toilet transfer.   Follow Up Recommendations  SNF(granddaughter plans to take pt home and provided 24/7 assist)     Equipment Recommendations  None recommended by PT    Recommendations for Other Services       Precautions / Restrictions Precautions Precautions: Fall Restrictions Weight Bearing Restrictions: No    Mobility  Bed Mobility Overal bed mobility: Needs Assistance Bed Mobility: Supine to Sit     Supine to sit: Min guard;HOB elevated     General bed mobility comments: increased time  Transfers Overall transfer level: Needs assistance Equipment used: Rolling walker (2 wheeled) Transfers: Sit to/from [facility] Sit to Stand: Supervision;Min guard Stand pivot transfers: Supervision;Min guard       General transfer comment: increased time also assisted with toilet transfer  Ambulation/Gait Ambulation/Gait assistance: Supervision;Min guard Gait Distance (Feet): 45 Feet Assistive device: Rolling walker (2 wheeled) Gait Pattern/deviations: Step-to pattern;Step-through pattern;Trunk flexed Gait velocity: decreased    General Gait Details: tolerated distance well.     Stairs             Wheelchair Mobility    Modified Rankin (Stroke Patients Only)       Balance                                            Cognition Arousal/Alertness: Awake/alert Behavior During  Therapy: WFL for tasks assessed/performed Overall Cognitive Status: Within Functional Limits for tasks assessed                                        Exercises      General Comments        Pertinent Vitals/Pain Pain Assessment: Faces Faces Pain Scale: Hurts a little bit Pain Location: chronic right knee (valgus deformity) Pain Descriptors / Indicators: Sore Pain Intervention(s): Monitored during session;Repositioned    Home Living                      Prior Function            PT Goals (current goals can now be found in the care plan section) Progress towards PT goals: Progressing toward goals    Frequency    Min 3X/week      PT Plan Current plan remains appropriate    Co-evaluation              AM-PAC PT "6 Clicks" Mobility   Outcome Measure  Help needed turning from your back to your side while in a flat bed without using bedrails?: A Little Help needed moving from lying on your back to sitting on the side of a flat bed without using bedrails?: A Little Help needed moving to and from a bed to  a chair (including a wheelchair)?: A Little Help needed standing up from a chair using your arms (e.g., wheelchair or bedside chair)?: A Little Help needed to walk in hospital room?: A Little Help needed climbing 3-5 steps with a railing? : A Little 6 Click Score: 18    End of Session Equipment Utilized During Treatment: Gait belt Activity Tolerance: Patient tolerated treatment well   Nurse Communication: Mobility status PT Visit Diagnosis: Other abnormalities of gait and mobility (R26.89)     Time: 0221-7981 PT Time Calculation (min) (ACUTE ONLY): 25 min  Charges:  $Gait Training: 8-22 mins $Therapeutic Activity: 8-22 mins                     {Kewanda Poland  PTA Acute  Rehabilitation Services Pager      [phone] Office      [phone]

## 2019-01-07 NOTE — Telephone Encounter (Signed)
Left the pt a message to call the office back.  I was calling to schedule the pt an appointment for a hospital follow-up and to do a TCM.

## 2019-01-08 DIAGNOSIS — I110 Hypertensive heart disease with heart failure: Secondary | ICD-10-CM | POA: Diagnosis not present

## 2019-01-10 ENCOUNTER — Telehealth: Payer: Self-pay

## 2019-01-10 NOTE — Telephone Encounter (Signed)
Attempted to call patient in order to set up a hospital follow up visit. Home number had an error message and mobile number mailbox is full.

## 2019-01-11 ENCOUNTER — Other Ambulatory Visit: Payer: Self-pay

## 2019-01-11 MED ORDER — APIXABAN 5 MG PO TABS: 5.0000 mg | ORAL_TABLET | Freq: Two times a day (BID) | ORAL | 1 refills | Status: DC

## 2019-01-14 ENCOUNTER — Ambulatory Visit: Payer: Medicare Other | Admitting: Internal Medicine

## 2019-01-15 ENCOUNTER — Other Ambulatory Visit: Payer: Self-pay | Admitting: Internal Medicine

## 2019-01-15 ENCOUNTER — Other Ambulatory Visit: Payer: Self-pay

## 2019-01-15 DIAGNOSIS — E1122 Type 2 diabetes mellitus with diabetic chronic kidney disease: Secondary | ICD-10-CM

## 2019-01-15 DIAGNOSIS — Z79899 Other long term (current) drug therapy: Secondary | ICD-10-CM | POA: Diagnosis not present

## 2019-01-15 DIAGNOSIS — N184 Chronic kidney disease, stage 4 (severe): Principal | ICD-10-CM

## 2019-01-15 DIAGNOSIS — Z794 Long term (current) use of insulin: Principal | ICD-10-CM

## 2019-01-16 ENCOUNTER — Other Ambulatory Visit: Payer: Self-pay | Admitting: Internal Medicine

## 2019-01-16 LAB — CBC/DIFF AMBIGUOUS DEFAULT
Basophils Absolute: 0.1 10*3/uL (ref 0.0–0.2)
Basos: 1 %
EOS (ABSOLUTE): 0.2 10*3/uL (ref 0.0–0.4)
Eos: 6 %
HEMOGLOBIN: 10.3 g/dL — AB (ref 11.1–15.9)
Hematocrit: 31.8 % — ABNORMAL LOW (ref 34.0–46.6)
Immature Grans (Abs): 0 10*3/uL (ref 0.0–0.1)
Immature Granulocytes: 1 %
LYMPHS ABS: 1.2 10*3/uL (ref 0.7–3.1)
Lymphs: 31 %
MCH: 29 pg (ref 26.6–33.0)
MCHC: 32.4 g/dL (ref 31.5–35.7)
MCV: 90 fL (ref 79–97)
MONOCYTES: 9 %
Monocytes Absolute: 0.4 10*3/uL (ref 0.1–0.9)
Neutrophils Absolute: 2.1 10*3/uL (ref 1.4–7.0)
Neutrophils: 52 %
Platelets: 200 10*3/uL (ref 150–450)
RBC: 3.55 x10E6/uL — ABNORMAL LOW (ref 3.77–5.28)
RDW: 13.5 % (ref 11.7–15.4)
WBC: 4 10*3/uL (ref 3.4–10.8)

## 2019-01-16 LAB — BMP8+EGFR
BUN / CREAT RATIO: 14 (ref 12–28)
BUN: 19 mg/dL (ref 8–27)
CO2: 24 mmol/L (ref 20–29)
Calcium: 9 mg/dL (ref 8.7–10.3)
Chloride: 103 mmol/L (ref 96–106)
Creatinine, Ser: 1.36 mg/dL — ABNORMAL HIGH (ref 0.57–1.00)
GFR calc Af Amer: 42 mL/min/{1.73_m2} — ABNORMAL LOW (ref >59)
GFR calc non Af Amer: 36 mL/min/{1.73_m2} — ABNORMAL LOW (ref >59)
Glucose: 182 mg/dL — ABNORMAL HIGH (ref 65–99)
Potassium: 4.6 mmol/L (ref 3.5–5.2)
Sodium: 142 mmol/L (ref 134–144)

## 2019-01-31 ENCOUNTER — Ambulatory Visit: Payer: Self-pay

## 2019-01-31 NOTE — Chronic Care Management (AMB) (Signed)
  Chronic Care Management   Telephone Outreach Note  01/31/2019 Name: [name] MRN: [id number] DOB: Oct 14, 1933  Referred by: patient's health plan.   I received a referral from the patients health plan requesting a call to the patients caregiver/grand-daughter [name] to introduce CCM program. I placed an unsuccessful call to [name]. Unfortunately, the voice mailbox was full which prohibited leaving a HIPAA compliant message requesting a return call.   The CM team will reach out to the patient again over the next 5 days.    [name], MD has been notified of this outreach and plan.  [name], BSW, CDP [facility] Care Management Social Worker [phone]  Total time spent performing care coordination and/or care management activities with the patient by phone or face to face = 5 minutes.      [email or web address]@

## 2019-02-04 ENCOUNTER — Encounter: Payer: Self-pay | Admitting: Internal Medicine

## 2019-02-04 ENCOUNTER — Other Ambulatory Visit: Payer: Self-pay

## 2019-02-04 ENCOUNTER — Ambulatory Visit: Payer: Self-pay

## 2019-02-04 ENCOUNTER — Ambulatory Visit (INDEPENDENT_AMBULATORY_CARE_PROVIDER_SITE_OTHER): Payer: Medicare Other | Admitting: Internal Medicine

## 2019-02-04 VITALS — Ht 65.0 in

## 2019-02-04 DIAGNOSIS — Z7189 Other specified counseling: Secondary | ICD-10-CM

## 2019-02-04 DIAGNOSIS — N183 Chronic kidney disease, stage 3 unspecified: Secondary | ICD-10-CM

## 2019-02-04 DIAGNOSIS — N184 Chronic kidney disease, stage 4 (severe): Secondary | ICD-10-CM | POA: Diagnosis not present

## 2019-02-04 DIAGNOSIS — E1122 Type 2 diabetes mellitus with diabetic chronic kidney disease: Secondary | ICD-10-CM | POA: Diagnosis not present

## 2019-02-04 DIAGNOSIS — I130 Hypertensive heart and chronic kidney disease with heart failure and stage 1 through stage 4 chronic kidney disease, or unspecified chronic kidney disease: Secondary | ICD-10-CM

## 2019-02-04 DIAGNOSIS — Z794 Long term (current) use of insulin: Principal | ICD-10-CM

## 2019-02-04 DIAGNOSIS — D6832 Hemorrhagic disorder due to extrinsic circulating anticoagulants: Secondary | ICD-10-CM | POA: Diagnosis not present

## 2019-02-04 DIAGNOSIS — I10 Essential (primary) hypertension: Secondary | ICD-10-CM

## 2019-02-04 DIAGNOSIS — T45515A Adverse effect of anticoagulants, initial encounter: Secondary | ICD-10-CM

## 2019-02-04 DIAGNOSIS — C50311 Malignant neoplasm of lower-inner quadrant of right female breast: Secondary | ICD-10-CM | POA: Diagnosis not present

## 2019-02-04 DIAGNOSIS — Z170 Estrogen receptor positive status [ER+]: Secondary | ICD-10-CM

## 2019-02-04 MED ORDER — LEVEMIR FLEXTOUCH 100 UNIT/ML ~~LOC~~ SOPN: 10.0000 [IU] | PEN_INJECTOR | Freq: Every day | SUBCUTANEOUS | 2 refills | Status: DC

## 2019-02-04 MED ORDER — OXYBUTYNIN CHLORIDE 5 MG PO TABS: 5.0000 mg | ORAL_TABLET | Freq: Two times a day (BID) | ORAL | 1 refills | Status: DC

## 2019-02-04 MED ORDER — APIXABAN 5 MG PO TABS: 5.0000 mg | ORAL_TABLET | Freq: Two times a day (BID) | ORAL | 2 refills | Status: DC

## 2019-02-04 MED ORDER — OMEPRAZOLE 40 MG PO CPDR: DELAYED_RELEASE_CAPSULE | ORAL | 1 refills | Status: DC

## 2019-02-04 MED ORDER — CARVEDILOL 12.5 MG PO TABS: 12.5000 mg | ORAL_TABLET | Freq: Two times a day (BID) | ORAL | 2 refills | Status: DC

## 2019-02-04 MED ORDER — METOPROLOL SUCCINATE ER 50 MG PO TB24: 50.0000 mg | ORAL_TABLET | Freq: Every day | ORAL | 0 refills | Status: DC

## 2019-02-04 MED ORDER — HUMALOG KWIKPEN 200 UNIT/ML ~~LOC~~ SOPN: 4.0000 [IU] | PEN_INJECTOR | Freq: Three times a day (TID) | SUBCUTANEOUS | 1 refills | Status: DC

## 2019-02-04 MED ORDER — LOSARTAN POTASSIUM 100 MG PO TABS: ORAL_TABLET | ORAL | 2 refills | Status: DC

## 2019-02-04 MED ORDER — ATORVASTATIN CALCIUM 20 MG PO TABS: 20.0000 mg | ORAL_TABLET | Freq: Every day | ORAL | 2 refills | Status: DC

## 2019-02-04 MED ORDER — FUROSEMIDE 80 MG PO TABS: 40.0000 mg | ORAL_TABLET | Freq: Two times a day (BID) | ORAL | 2 refills | Status: DC

## 2019-02-04 NOTE — Progress Notes (Addendum)
Virtual Visit via Video Note   This visit type was conducted due to national recommendations for restrictions regarding the COVID-19 Pandemic (e.g. social distancing).  This format is felt to be most appropriate for this patient at this time.  All issues noted in this document were discussed and addressed.  No physical exam was performed (except for noted visual exam findings with Video Visits).  Please refer to the patient's chart (MyChart message for video visits and phone note for telephone visits) for the patient's consent to telehealth for [facility].  Date:  02/04/2019   ID:  [name], DOB 1933/09/12, MRN [id number]  Patient Location:  Granddaughter's home in [place], [place]. Her granddaughter, [name], was also present for the visit, with patient's consent.   Provider location:   Office   Chief Complaint:  Diabetes f/u  History of Present Illness:    [name] is a 85 y.o. female who presents via video conferencing for a telehealth visit today.    The patient does not have symptoms concerning for COVID-19 infection (fever, chills, cough, or new shortness of breath).   Hypertension  This is a chronic problem. The current episode started more than 1 year ago. The problem is uncontrolled. Risk factors for coronary artery disease include diabetes mellitus, dyslipidemia, obesity, post-menopausal state and sedentary lifestyle. Hypertensive end-organ damage includes kidney disease and heart failure. Identifiable causes of hypertension include chronic renal disease.  Diabetes  She presents for her follow-up diabetic visit. She has type 2 diabetes mellitus. There are no hypoglycemic associated symptoms. There are no hypoglycemic complications. Risk factors for coronary artery disease include diabetes mellitus, dyslipidemia, post-menopausal, sedentary lifestyle and hypertension. She never participates in exercise. Her breakfast blood glucose is taken between 8-9 am. Her breakfast blood glucose  range is generally 110-130 mg/dl. An ACE inhibitor/angiotensin II receptor blocker is not being taken. Eye exam is current.     Past Medical History:  Diagnosis Date  . Anemia   . Arthritis   . Cancer ([place])   . Cataract   . Chronic diastolic CHF (congestive heart failure) ([place])   . CKD (chronic kidney disease), stage III ([place])   . Clotting disorder ([place])   . Diabetes mellitus ([place])   . Dizziness and giddiness   . DVT (deep venous thrombosis) ([place])    a. On Coumadin for this.  . Essential hypertension   . LBBB (left bundle branch block)   . Mitral regurgitation    a. Mod MR by echo 2014.  . Muscular deconditioning   . Obesity   . SVT (supraventricular tachycardia) ([place])    a. In 2013 she had an EPS with ablation for SVT which did not eliminate the SVT completely. She also had bradycardia which limited medication. She was placed on amiodarone by Dr. Lovena Le.  . Thrombocytopenia ([place]) 11/21/2011   Past Surgical History:  Procedure Laterality Date  . EYE SURGERY    . SUPRAVENTRICULAR TACHYCARDIA ABLATION N/A 10/11/2012   Procedure: SUPRAVENTRICULAR TACHYCARDIA ABLATION;  Surgeon: Evans Lance, MD;  Location: [facility] CATH LAB;  Service: Cardiovascular;  Laterality: N/A;     Current Meds  Medication Sig  . ACCU-CHEK AVIVA PLUS test strip USE AS DIRECTED TO CHECK BLOOD SUGAR QID  . acetaminophen (TYLENOL) 500 MG tablet Take 1,000 mg by mouth every 6 (six) hours as needed.  [name] amiodarone (PACERONE) 200 MG tablet Take 1 tablet (200 mg total) by mouth daily.  . BD PEN NEEDLE NANO U/F 32G X 4  MM MISC Use as directed  . calcium carbonate (OS-CAL) 600 MG TABS Take 600 mg by mouth daily with breakfast.   . diclofenac sodium (VOLTAREN) 1 % GEL Apply 2 g topically 4 (four) times daily.  . potassium chloride SA (K-DUR,KLOR-CON) 20 MEQ tablet Take 2 tablets (40 mEq total) by mouth daily.  . pregabalin (LYRICA) 75 MG capsule Take 75 mg by mouth at bedtime.  . traMADol (ULTRAM) 50 MG tablet Take 50  mg by mouth 2 (two) times daily as needed for pain.  . [DISCONTINUED] apixaban (ELIQUIS) 5 MG TABS tablet Take 1 tablet (5 mg total) by mouth 2 (two) times daily.  . [DISCONTINUED] atorvastatin (LIPITOR) 20 MG tablet TAKE 1 TABLET BY MOUTH AT NIGHT (Patient taking differently: Take 20 mg by mouth daily at 6 PM. )  . [DISCONTINUED] carvedilol (COREG) 12.5 MG tablet Take 1 tablet (12.5 mg total) by mouth 2 (two) times daily.  . [DISCONTINUED] furosemide (LASIX) 80 MG tablet Take 0.5 tablets (40 mg total) by mouth 2 (two) times daily.  . [DISCONTINUED] HUMALOG KWIKPEN 200 UNIT/ML SOPN Inject 4-8 Units into the skin 3 (three) times daily. Per sliding scale  . [DISCONTINUED] LEVEMIR FLEXTOUCH 100 UNIT/ML Pen Inject 10-16 Units into the skin daily. 16 units in the am and 10 units in the evening  . [DISCONTINUED] losartan (COZAAR) 100 MG tablet TAKE 1/2 TABLET(50 MG) BY MOUTH DAILY (Patient taking differently: Take 50 mg by mouth daily. )  . [DISCONTINUED] metoprolol succinate (TOPROL-XL) 50 MG 24 hr tablet TAKE 1 TABLET BY MOUTH ONCE DAILY  . [DISCONTINUED] omeprazole (PRILOSEC) 40 MG capsule TAKE ONE CAPSULE BY MOUTH DAILY BEFORE A MEAL (Patient taking differently: Take 40 mg by mouth daily. )  . [DISCONTINUED] oxybutynin (DITROPAN) 5 MG tablet Take 1 tablet (5 mg total) by mouth 2 (two) times daily.     Allergies:   Aspirin and Penicillins   Social History   Tobacco Use  . Smoking status: Never Smoker  . Smokeless tobacco: Never Used  Substance Use Topics  . Alcohol use: No  . Drug use: No     Family Hx: The patient's family history includes Breast cancer in her maternal aunt; Cancer in her father; Heart attack in her brother; Hypertension in her brother, daughter, and sister; Stroke in her mother and sister.  ROS:   Please see the history of present illness.    Review of Systems  Constitutional: Negative.   Respiratory: Negative.   Cardiovascular: Negative.   Gastrointestinal:  Negative.   Neurological: Negative.   Psychiatric/Behavioral: Negative.     All other systems reviewed and are negative.   Labs/Other Tests and Data Reviewed:    Recent Labs: 08/06/2018: NT-Pro BNP 1,604 01/01/2019: ALT 26 01/15/2019: BUN 19; Creatinine, Ser 1.36; Hemoglobin 10.3; Platelets 200; Potassium 4.6; Sodium 142   Recent Lipid Panel Lab Results  Component Value Date/Time   CHOL 195 08/15/2018 12:36 PM   TRIG 56 01/02/2019 02:25 PM   HDL 81 08/15/2018 12:36 PM   CHOLHDL 2.4 08/15/2018 12:36 PM   CHOLHDL 2.6 12/11/2008 01:30 AM   LDLCALC 96 08/15/2018 12:36 PM    Wt Readings from Last 3 Encounters:  01/01/19 197 lb (89.4 kg)  12/07/18 188 lb (85.3 kg)  12/06/18 188 lb (85.3 kg)     Exam:    Vital Signs:  Ht 5\' 5"  (1.651 m)   BMI 32.78 kg/m     Physical Exam  Constitutional: She is oriented to person, place,  and time and well-developed, well-nourished, and in no distress.  HENT:  Head: Normocephalic and atraumatic.  Neck: Normal range of motion.  Pulmonary/Chest: Effort normal.  Neurological: She is alert and oriented to person, place, and time.  Psychiatric: Affect normal.  Nursing note and vitals reviewed.   ASESSMENT & PLAN:     1. 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 She will continue with current meds. Importance of salt restriction and dietary compliance was discussed with the patient.   2. Type 2 diabetes mellitus with stage 4 chronic kidney disease, with long-term current use of insulin (HCC)  Blood sugar log was reviewed in full detail. She was congratulated on the improvement in her blood sugars. She is encouraged to keep up the great work. A rx for pen needles will be sent to the pharmacy. She agrees to f/u in four to six weeks. This will be a virtual visit.   3. Malignant neoplasm of lower-inner quadrant of right breast of female, estrogen receptor positive ([place])  Newly diagnosed. She would like to have  second opinion at [facility]. Referral will be placed as requested.   4. Warfarin-induced coagulopathy ([place])  I will send orders for her to have repeat INR next week.     COVID-19 Education: The signs and symptoms of COVID-19 were discussed with the patient and how to seek care for testing (follow up with PCP or arrange E-visit).  The importance of social distancing was discussed today.  Patient Risk:   After full review of this patients clinical status, I feel that they are at least moderate risk at this time.  Time:   Today, I have spent 18 minutes/25 seconds with the patient with telehealth technology discussing above diagnoses.    Medication Adjustments/Labs and Tests Ordered: Current medicines are reviewed at length with the patient today.  Concerns regarding medicines are outlined above.  Tests Ordered: Orders Placed This Encounter  Procedures  . Protime-INR  . CBC no Diff  . Ambulatory referral to Oncology   Medication Changes: No orders of the defined types were placed in this encounter.   Disposition:  Follow up in 4 week(s)  Signed, Maximino Greenland, MD

## 2019-02-04 NOTE — Patient Instructions (Signed)
Social Worker Visit Information   Materials provided: Verbal education about CCM Program provided by phone  Ms. Martian was given information about Chronic Care Management services today including:  1. CCM service includes personalized support from designated clinical staff supervised by her physician, including individualized plan of care and coordination with other care providers 2. 24/7 contact phone numbers for assistance for urgent and routine care needs. 3. Service will only be billed when office clinical staff spend 20 minutes or more in a month to coordinate care. 4. Only one practitioner may furnish and bill the service in a calendar month. 5. The patient may stop CCM services at any time (effective at the end of the month) by phone call to the office staff. 6. The patient will be responsible for cost sharing (co-pay) of up to 20% of the service fee (after annual deductible is met).  Patient agreed to services and verbal consent obtained.   The patient verbalized understanding of instructions provided today and declined a print copy of patient instruction materials.   Follow up plan: SW will follow up with patient by phone over the next 14 days.   [name], BSW, CDP [facility] / [facility] Care Management Social Worker [phone]

## 2019-02-04 NOTE — Patient Instructions (Signed)
Diabetes Mellitus and Nutrition, Adult  When you have diabetes (diabetes mellitus), it is very important to have healthy eating habits because your blood sugar (glucose) levels are greatly affected by what you eat and drink. Eating healthy foods in the appropriate amounts, at about the same times every day, can help you:  · Control your blood glucose.  · Lower your risk of heart disease.  · Improve your blood pressure.  · Reach or maintain a healthy weight.  Every person with diabetes is different, and each person has different needs for a meal plan. Your health care provider may recommend that you work with a diet and nutrition specialist (dietitian) to make a meal plan that is best for you. Your meal plan may vary depending on factors such as:  · The calories you need.  · The medicines you take.  · Your weight.  · Your blood glucose, blood pressure, and cholesterol levels.  · Your activity level.  · Other health conditions you have, such as heart or kidney disease.  How do carbohydrates affect me?  Carbohydrates, also called carbs, affect your blood glucose level more than any other type of food. Eating carbs naturally raises the amount of glucose in your blood. Carb counting is a method for keeping track of how many carbs you eat. Counting carbs is important to keep your blood glucose at a healthy level, especially if you use insulin or take certain oral diabetes medicines.  It is important to know how many carbs you can safely have in each meal. This is different for every person. Your dietitian can help you calculate how many carbs you should have at each meal and for each snack.  Foods that contain carbs include:  · Bread, cereal, rice, pasta, and crackers.  · Potatoes and corn.  · Peas, beans, and lentils.  · Milk and yogurt.  · Fruit and juice.  · Desserts, such as cakes, cookies, ice cream, and candy.  How does alcohol affect me?  Alcohol can cause a sudden decrease in blood glucose (hypoglycemia),  especially if you use insulin or take certain oral diabetes medicines. Hypoglycemia can be a life-threatening condition. Symptoms of hypoglycemia (sleepiness, dizziness, and confusion) are similar to symptoms of having too much alcohol.  If your health care provider says that alcohol is safe for you, follow these guidelines:  · Limit alcohol intake to no more than 1 drink per day for nonpregnant women and 2 drinks per day for men. One drink equals 12 oz of beer, 5 oz of wine, or 1½ oz of hard liquor.  · Do not drink on an empty stomach.  · Keep yourself hydrated with water, diet soda, or unsweetened iced tea.  · Keep in mind that regular soda, juice, and other mixers may contain a lot of sugar and must be counted as carbs.  What are tips for following this plan?    Reading food labels  · Start by checking the serving size on the "Nutrition Facts" label of packaged foods and drinks. The amount of calories, carbs, fats, and other nutrients listed on the label is based on one serving of the item. Many items contain more than one serving per package.  · Check the total grams (g) of carbs in one serving. You can calculate the number of servings of carbs in one serving by dividing the total carbs by 15. For example, if a food has 30 g of total carbs, it would be equal to 2   servings of carbs.  · Check the number of grams (g) of saturated and trans fats in one serving. Choose foods that have low or no amount of these fats.  · Check the number of milligrams (mg) of salt (sodium) in one serving. Most people should limit total sodium intake to less than 2,300 mg per day.  · Always check the nutrition information of foods labeled as "low-fat" or "nonfat". These foods may be higher in added sugar or refined carbs and should be avoided.  · Talk to your dietitian to identify your daily goals for nutrients listed on the label.  Shopping  · Avoid buying canned, premade, or processed foods. These foods tend to be high in fat, sodium,  and added sugar.  · Shop around the outside edge of the grocery store. This includes fresh fruits and vegetables, bulk grains, fresh meats, and fresh dairy.  Cooking  · Use low-heat cooking methods, such as baking, instead of high-heat cooking methods like deep frying.  · Cook using healthy oils, such as olive, canola, or sunflower oil.  · Avoid cooking with butter, cream, or high-fat meats.  Meal planning  · Eat meals and snacks regularly, preferably at the same times every day. Avoid going long periods of time without eating.  · Eat foods high in fiber, such as fresh fruits, vegetables, beans, and whole grains. Talk to your dietitian about how many servings of carbs you can eat at each meal.  · Eat 4-6 ounces (oz) of lean protein each day, such as lean meat, chicken, fish, eggs, or tofu. One oz of lean protein is equal to:  ? 1 oz of meat, chicken, or fish.  ? 1 egg.  ? ¼ cup of tofu.  · Eat some foods each day that contain healthy fats, such as avocado, nuts, seeds, and fish.  Lifestyle  · Check your blood glucose regularly.  · Exercise regularly as told by your health care provider. This may include:  ? 150 minutes of moderate-intensity or vigorous-intensity exercise each week. This could be brisk walking, biking, or water aerobics.  ? Stretching and doing strength exercises, such as yoga or weightlifting, at least 2 times a week.  · Take medicines as told by your health care provider.  · Do not use any products that contain nicotine or tobacco, such as cigarettes and e-cigarettes. If you need help quitting, ask your health care provider.  · Work with a counselor or diabetes educator to identify strategies to manage stress and any emotional and social challenges.  Questions to ask a health care provider  · Do I need to meet with a diabetes educator?  · Do I need to meet with a dietitian?  · What number can I call if I have questions?  · When are the best times to check my blood glucose?  Where to find more  information:  · American Diabetes Association: diabetes.org  · Academy of Nutrition and Dietetics: www.eatright.org  · National Institute of Diabetes and Digestive and Kidney Diseases (NIH): www.niddk.nih.gov  Summary  · A healthy meal plan will help you control your blood glucose and maintain a healthy lifestyle.  · Working with a diet and nutrition specialist (dietitian) can help you make a meal plan that is best for you.  · Keep in mind that carbohydrates (carbs) and alcohol have immediate effects on your blood glucose levels. It is important to count carbs and to use alcohol carefully.  This information is not intended to   replace advice given to you by your health care provider. Make sure you discuss any questions you have with your health care provider.  Document Released: 07/07/2005 Document Revised: 05/10/2017 Document Reviewed: 11/14/2016  Elsevier Interactive Patient Education © 2019 Elsevier Inc.

## 2019-02-04 NOTE — Chronic Care Management (AMB) (Signed)
  Chronic Care Management   Telephone Outreach Note  02/04/2019 Name: [name] MRN: [id number] DOB: 09-Dec-1932  Referred by: patient's health plan.   I reached out to [name] today by phone in response to a referral sent by [name]'s health plan. [name] and I briefly discussed care management needs related to CKD Stage III, DM, and HTN.  [name] was given information about Chronic Care Management services today including:  1. CCM service includes personalized support from designated clinical staff supervised by her physician, including individualized plan of care and coordination with other care providers 2. 24/7 contact phone numbers for assistance for urgent and routine care needs. 3. Service will only be billed when office clinical staff spend 20 minutes or more in a month to coordinate care. 4. Only one practitioner may furnish and bill the service in a calendar month. 5. The patient may stop CCM services at any time (effective at the end of the month) by phone call to the office staff. 6. The patient will be responsible for cost sharing (co-pay) of up to 20% of the service fee (after annual deductible is met).  Patient agreed to services and verbal consent obtained.   The CM team will reach out to the patient again over the next 14 days.    [name], MD has been notified of this outreach and [name]'s decision and plan.   [name], BSW, CDP [facility] / [facility] Care Management Social Worker [phone]  Total time spent performing care coordination and/or care management activities with the patient by phone or face to face = 15 minutes.

## 2019-02-07 ENCOUNTER — Other Ambulatory Visit: Payer: Self-pay

## 2019-02-07 MED ORDER — BD PEN NEEDLE NANO U/F 32G X 4 MM MISC: 2 refills | Status: DC

## 2019-02-08 DIAGNOSIS — I110 Hypertensive heart disease with heart failure: Secondary | ICD-10-CM | POA: Diagnosis not present

## 2019-02-13 ENCOUNTER — Telehealth: Payer: Self-pay

## 2019-02-14 ENCOUNTER — Ambulatory Visit: Payer: Self-pay

## 2019-02-14 DIAGNOSIS — Z170 Estrogen receptor positive status [ER+]: Secondary | ICD-10-CM

## 2019-02-14 DIAGNOSIS — E1122 Type 2 diabetes mellitus with diabetic chronic kidney disease: Secondary | ICD-10-CM

## 2019-02-14 DIAGNOSIS — I10 Essential (primary) hypertension: Secondary | ICD-10-CM

## 2019-02-14 DIAGNOSIS — C50311 Malignant neoplasm of lower-inner quadrant of right female breast: Secondary | ICD-10-CM

## 2019-02-14 DIAGNOSIS — Z794 Long term (current) use of insulin: Secondary | ICD-10-CM

## 2019-02-14 DIAGNOSIS — N183 Chronic kidney disease, stage 3 unspecified: Secondary | ICD-10-CM

## 2019-02-14 DIAGNOSIS — N184 Chronic kidney disease, stage 4 (severe): Secondary | ICD-10-CM

## 2019-02-14 NOTE — Patient Instructions (Signed)
Social Worker Visit Information  It was great speaking with you today! Please contact me directly if anything is needed prior to my next outreach.   Follow Up Plan: SW will follow up with patient by phone over the next 4-5 weeks.   [name], BSW, CDP [facility] / [facility] Care Management Social Worker [phone]

## 2019-02-14 NOTE — Chronic Care Management (AMB) (Signed)
  Chronic Care Management   Social Work Note  02/14/2019 Name: SHAWNETTA LEIN MRN: [id number] DOB: [birth year]  [name] is a 85 y.o. year old female who sees [name], MD for primary care. The CCM team was consulted for assistance with care coordination.  I placed a follow up call to the patient to assess current status. The patient continues to reside with her grand-daughter in [place]. The patient reports she will be seeing a physician in [place] for a second opinion on a recent diagnosis. SW reminded the patient of the role this Probation officer has under the chronic care management program. The patient does not identify any SW needs during today's call but is agreeable to SW outreach in the next month to confirm.   Follow Up Plan: SW will follow up with patient by phone over the next 4-5 weeks.  [name], BSW, CDP [facility] / [facility] Care Management Social Worker [phone]  Total time spent performing care coordination and/or care management activities with the patient by phone or face to face = 8 minutes.

## 2019-02-15 ENCOUNTER — Telehealth: Payer: Self-pay | Admitting: *Deleted

## 2019-02-15 NOTE — Telephone Encounter (Signed)
Spoke with patient's granddaughter [name] and she states patient is going for a second opinion at [facility] in May.  Encouraged her to let us know what she decides regarding treatment.   The granddaughter is hoping she will be seen there 1st or 2nd week of May and she verbalized she would call and let us know.  Contact information given.

## 2019-02-18 DIAGNOSIS — D0510 Intraductal carcinoma in situ of unspecified breast: Secondary | ICD-10-CM | POA: Diagnosis not present

## 2019-02-18 DIAGNOSIS — N189 Chronic kidney disease, unspecified: Secondary | ICD-10-CM | POA: Diagnosis not present

## 2019-02-18 DIAGNOSIS — I10 Essential (primary) hypertension: Secondary | ICD-10-CM | POA: Diagnosis not present

## 2019-02-18 DIAGNOSIS — J209 Acute bronchitis, unspecified: Secondary | ICD-10-CM | POA: Diagnosis not present

## 2019-02-18 DIAGNOSIS — I5031 Acute diastolic (congestive) heart failure: Secondary | ICD-10-CM | POA: Diagnosis not present

## 2019-02-20 DIAGNOSIS — I5031 Acute diastolic (congestive) heart failure: Secondary | ICD-10-CM | POA: Diagnosis not present

## 2019-02-20 DIAGNOSIS — N189 Chronic kidney disease, unspecified: Secondary | ICD-10-CM | POA: Diagnosis not present

## 2019-02-22 ENCOUNTER — Ambulatory Visit: Payer: Self-pay

## 2019-02-22 DIAGNOSIS — N184 Chronic kidney disease, stage 4 (severe): Secondary | ICD-10-CM

## 2019-02-22 DIAGNOSIS — Z794 Long term (current) use of insulin: Secondary | ICD-10-CM

## 2019-02-22 DIAGNOSIS — I10 Essential (primary) hypertension: Secondary | ICD-10-CM

## 2019-02-22 DIAGNOSIS — C50311 Malignant neoplasm of lower-inner quadrant of right female breast: Secondary | ICD-10-CM

## 2019-02-22 DIAGNOSIS — Z170 Estrogen receptor positive status [ER+]: Secondary | ICD-10-CM

## 2019-02-22 DIAGNOSIS — N183 Chronic kidney disease, stage 3 unspecified: Secondary | ICD-10-CM

## 2019-02-22 DIAGNOSIS — E1122 Type 2 diabetes mellitus with diabetic chronic kidney disease: Secondary | ICD-10-CM

## 2019-02-22 NOTE — Chronic Care Management (AMB) (Signed)
Chronic Care Management   Initial Visit Note  02/22/2019 Name: [name] MRN: [id number] DOB: [birth year]  Referred by: [name], MD Reason for referral : Chronic Care Management (INITIAL CCM RN Telephone Outreach)   [name] is a 85 y.o. year old female who is a primary care patient of [name], MD. The CCM team was consulted for assistance with chronic disease management and care coordination needs.   Review of patient status, including review of consultants reports, relevant laboratory and other test results, and collaboration with appropriate care team members and the patient's provider was performed as part of comprehensive patient evaluation and provision of chronic care management services.    I spoke with [name] by telephone today for her initial CCM RN outreach.  Objective:  Lab Results  Component Value Date   HGBA1C 8.4 (H) 01/02/2019   HGBA1C 7.8 (H) 12/07/2018   HGBA1C 8.0 (H) 10/10/2018   Lab Results  Component Value Date   LDLCALC 96 08/15/2018   CREATININE 1.36 (H) 01/15/2019   BP Readings from Last 3 Encounters:  01/07/19 139/64  12/07/18 132/80  12/06/18 110/60   Goals Addressed      Patient Stated   . "I check my sugars three times a day" (pt-stated)       Current Barriers:  [name] Knowledge Deficits related to disease process and Self Health management for Diabetes  Nurse Case Manager Clinical Goal(s):  [name] Over the next 30 days, patient will verbalize basic understanding of Diabetes disease process and self health management plan as evidenced by patient will verbalize how to meal plan and follow a low carb Diabetic diet, monitor CBG's, take insulin exactly as prescribed and verbalize knowing when to call the doctor.   Interventions:   Completed initial CCM RN telephone outreach with patient  . Evaluation of current treatment plan related to Diabetes and patient's adherence to plan as established by provider . Provided education to  patient re: current A1C; discussed target A1C . Assessed for patient's comfort level and ability to self monitor CGB's (pt is checking tid) . Reviewed medications with patient and discussed importance of adherence to taking meds exactly as prescribed; assessed for financial hardship with paying for meds (Pharmacy referral sent) . Discussed plans with patient for ongoing care management follow up and provided patient with direct contact information for care management team . Provided patient with printed educational materials related to Meal Planning using the plate method, Knowing when to call the doctor and Knowing s/s of hypo/hyperglycemia . Advised patient, providing education and rationale, to check cbg preprandial each am and record, calling RN CM and or PCP for findings outside established parameters . Provided RN CM contact # and discussed nurse availability . Scheduled a CCM telephone follow up with patient for about 2 weeks  Patient Self Care Activities:   Verbalizes understanding of the education/information provided today  . Self administers medications as prescribed . Attends all scheduled provider appointments . Calls pharmacy for medication refills . Performs ADL's independently . Performs IADL's independently . Calls provider office for new concerns or questions  Initial goal documentation     . 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  . Over the next 30 days, patient will attend all scheduled medical appointments: including follow up at [facility] for 2nd opinion of breast neoplasm scheduled for 03/04/19.   Interventions:   Completed  initial CCM RN telephone outreach with patient  . Evaluation of current treatment plan related to diagnosis and treatment for right breast neoplasm and patient's adherence to plan as established by provider . Discussed plans with patient for ongoing care management follow up and provided patient with direct contact information for care management team  Reviewed scheduled/upcoming provider appointments including: new patient 2nd opinion; [name], [place]; 03/04/19  Provided patient with RN CM contact # and discussed hours of availability  Scheduled a CCM telephone follow up call with patient for about 2 weeks  Patient Self Care Activities:  . Self administers medications as prescribed . Attends all scheduled provider appointments . Calls pharmacy for medication refills . Performs ADL's independently . Performs IADL's independently . Calls provider office for new concerns or questions  Initial goal documentation        Telephone follow up appointment with CCM team member scheduled for: 03/08/19  [name], [facility] Care Management Coordinator [place] [facility]  Direct Phone: [phone]

## 2019-02-22 NOTE — Patient Instructions (Addendum)
Visit Information  Goals Addressed      Patient Stated   . "I check my sugars three times a day" (pt-stated)       Current Barriers:  [name] Knowledge Deficits related to disease process and Self Health management for Diabetes  Nurse Case Manager Clinical Goal(s):  [name] Over the next 30 days, patient will verbalize basic understanding of Diabetes disease process and self health management plan as evidenced by patient will verbalize how to meal plan and follow a low carb Diabetic diet, monitor CBG's, take insulin exactly as prescribed and verbalize knowing when to call the doctor.   Interventions:   Completed initial CCM RN telephone outreach with patient  . Evaluation of current treatment plan related to Diabetes and patient's adherence to plan as established by provider . Provided education to patient re: current A1C; discussed target A1C . Assessed for patient's comfort level and ability to self monitor CGB's (pt is monitoring tid) . Reviewed medications with patient and discussed importance of adherence to taking meds exactly as prescribed; assessed for financial hardship with paying for meds (pharmacy referral sent) . Discussed plans with patient for ongoing care management follow up and provided patient with direct contact information for care management team . Provided patient with printed educational materials related to Meal Planning using the plate method, Knowing when to call the doctor and Knowing s/s of hypo/hyperglycemia . Advised patient, providing education and rationale, to check cbg preprandial each am and record, calling RN CM and or PCP for findings outside established parameters . Provided RN CM contact # and discussed nurse availability . Scheduled a CCM telephone follow up with patient for about 2 weeks  Patient Self Care Activities:   Verbalizes understanding of the education/information provided today  . Self administers medications as prescribed . Attends all scheduled  provider appointments . Calls pharmacy for medication refills . Performs ADL's independently . Performs IADL's independently . Calls provider office for new concerns or questions  Initial goal documentation     . 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  . Over the next 30 days, patient will attend all scheduled medical appointments: including follow up at [facility] for 2nd opinion of breast neoplasm scheduled for 03/04/19.   Interventions:   Completed initial CCM RN telephone outreach with patient  . Evaluation of current treatment plan related to diagnosis and treatment for right breast neoplasm and patient's adherence to plan as established by provider . Discussed plans with patient for ongoing care management follow up and provided patient with direct contact information for care management team  Reviewed scheduled/upcoming provider appointments including: new patient 2nd opinion; [name], [place]; 03/04/19  Provided patient with RN CM contact # and discussed hours of availability  Scheduled a CCM telephone follow up call with patient for about 2 weeks  Patient Self Care Activities:  . Self administers medications as prescribed . Attends all scheduled provider appointments . Calls pharmacy for medication refills . Performs ADL's independently . Performs IADL's independently . Calls provider office for new concerns or questions  Initial goal documentation        The patient verbalized understanding of instructions provided  today and declined a print copy of patient instruction materials.   Telephone follow up appointment with CCM  team member scheduled for: 03/08/19  [name], [facility] Care Management Coordinator [place] [facility]  Direct Phone: [phone]

## 2019-02-25 ENCOUNTER — Telehealth: Payer: Self-pay | Admitting: *Deleted

## 2019-02-26 ENCOUNTER — Ambulatory Visit (INDEPENDENT_AMBULATORY_CARE_PROVIDER_SITE_OTHER): Payer: Medicare Other | Admitting: Pharmacist

## 2019-02-26 DIAGNOSIS — Z794 Long term (current) use of insulin: Secondary | ICD-10-CM

## 2019-02-26 DIAGNOSIS — N184 Chronic kidney disease, stage 4 (severe): Secondary | ICD-10-CM | POA: Diagnosis not present

## 2019-02-26 DIAGNOSIS — N183 Chronic kidney disease, stage 3 unspecified: Secondary | ICD-10-CM

## 2019-02-26 DIAGNOSIS — E1122 Type 2 diabetes mellitus with diabetic chronic kidney disease: Secondary | ICD-10-CM

## 2019-02-26 DIAGNOSIS — I10 Essential (primary) hypertension: Secondary | ICD-10-CM

## 2019-02-26 DIAGNOSIS — I130 Hypertensive heart and chronic kidney disease with heart failure and stage 1 through stage 4 chronic kidney disease, or unspecified chronic kidney disease: Secondary | ICD-10-CM | POA: Diagnosis not present

## 2019-02-26 NOTE — Progress Notes (Signed)
   Chronic Care Management   Initial Visit Note  02/27/2019 Name: [name] MRN: [id number] DOB: [birth year]  Referred by: [name], MD Reason for referral : Chronic Care Management   [name] is a 85 y.o. year old female who is a primary care patient of [name], MD. The CCM team was consulted for assistance with chronic disease management and care coordination needs.   Review of patient status, including review of consultants reports, relevant laboratory and other test results, and collaboration with appropriate care team members and the patient's provider was performed as part of comprehensive patient evaluation and provision of chronic care management services.    Objective:   Goals Addressed            This Visit's Progress     Patient Stated   . I think my sugar has been higher recently (pt-stated)       Current Barriers:  . Non Adherence to prescribed diabetes diet/regimen (most recent A1c 8.4)  Pharmacist Clinical Goal(s):  [name] Over the next 60 days, patient will demonstrate Improved medication/diet adherence as evidenced by decreased A1c/improved blood sugar readings.  Interventions: . Comprehensive medication review performed. . Advised patient to eat DM healthy diet, continue taking medications as prescribed. . Encouraged patient to review diabetes materials mailed by CCM RN  Patient Self Care Activities: with granddaughter assistance . Self administers medications as prescribed . Attends all scheduled provider appointments . Calls pharmacy for medication refills  Initial goal documentation     . My medications are too expensive (pt-stated)       Current Barriers:  . Financial Barriers  Pharmacist Clinical Goal(s):  [name] Over the next 14 days, patient will work with CCM team/PharmD to address needs related to medications (financial)  Interventions: . Comprehensive medication review performed. [name] Collaboration with health plan regarding  medication benefit . Collaboration with RN Care Manager re: medications/DM  . Call placed to [facility] pharmacy-->discovered patient has FULL Extra Help/LIS meaning that her copays are $8.90 (brand meds, 17-month supply) and $3.40 (generic meds, 45-month supply).  Unfortunately, she does not qualify for manufacturers programs due to FULL LIS/extra help.   . Will request new RX written for Lantus pens & Humalog pens qty: 75mL (8 insulin pens=90 day supply). New RX not needed until June 2020 since 90-supply filled April 2020.  . Will collaborate with CCM BSW regarding Medicaid and other resources. . Will continue to explore cost saving options . Will ensure all RXs are written for 81-month supplies.  Patient Self Care Activities:  . Self administers medications as prescribed . Attends all scheduled provider appointments . Calls pharmacy for medication refills  Initial goal documentation      Plan:   The CM team will reach out to the patient again over the next 14 days.    Regina Eck, PharmD, BCPS Clinical Pharmacist, [facility] [place]: [phone]

## 2019-02-27 ENCOUNTER — Telehealth: Payer: Self-pay | Admitting: Hematology and Oncology

## 2019-02-27 DIAGNOSIS — C50311 Malignant neoplasm of lower-inner quadrant of right female breast: Secondary | ICD-10-CM | POA: Diagnosis not present

## 2019-02-27 DIAGNOSIS — I10 Essential (primary) hypertension: Secondary | ICD-10-CM | POA: Diagnosis not present

## 2019-02-27 DIAGNOSIS — I5031 Acute diastolic (congestive) heart failure: Secondary | ICD-10-CM | POA: Diagnosis not present

## 2019-02-27 DIAGNOSIS — Z170 Estrogen receptor positive status [ER+]: Secondary | ICD-10-CM | POA: Diagnosis not present

## 2019-02-27 NOTE — Telephone Encounter (Signed)
Second Request. Faxed most recent office note and let [facility] with [name] know that there were no Chemo flow sheets. Release [id number]

## 2019-02-28 NOTE — Patient Instructions (Signed)
Visit Information  Goals Addressed            This Visit's Progress     Patient Stated   . I think my sugar has been higher recently (pt-stated)       Current Barriers:  . Non Adherence to prescribed diabetes diet/regimen (most recent A1c 8.4)  Pharmacist Clinical Goal(s):  [name] Over the next 60 days, patient will demonstrate Improved medication/diet adherence as evidenced by decreased A1c/improved blood sugar readings.  Interventions: . Comprehensive medication review performed. . Advised patient to eat DM healthy diet, continue taking medications as prescribed.  Patient Self Care Activities: with granddaughter assistance . Self administers medications as prescribed . Attends all scheduled provider appointments . Calls pharmacy for medication refills  Initial goal documentation     . My medications are too expensive (pt-stated)       Current Barriers:  . Financial Barriers  Pharmacist Clinical Goal(s):  [name] Over the next 14 days, patient will work with CCM team/PharmD to address needs related to medications (financial)  Interventions: . Comprehensive medication review performed. [name] Collaboration with health plan regarding medication benefit . Collaboration with RN Care Manager re: medications/DM  . Call placed to [facility] pharmacy-->discovered patient has FULL Extra Help/LIS meaning that her copays are $8.90 (brand meds, 36-month supply) and $3.40 (generic meds, 47-month supply).  Unfortunately, she does not qualify for manufacturers programs due to FULL LIS/extra help.   . Will request new RX written for Lantus pens & Humalog pens qty: 83mL (8 insulin pens=90 day supply). New RX not needed until June 2020-will follow . Will collaborate with CCM BSW regarding Medicaid and other resources. . Will continue to explore cost saving options . Will ensure all RXs are written for 60-month supplies.  Patient Self Care Activities:  . Self administers medications as prescribed . Attends  all scheduled provider appointments . Calls pharmacy for medication refills  Initial goal documentation      The patient verbalized understanding of instructions provided today and declined a print copy of patient instruction materials.   The CM team will reach out to the patient again over the next 14 days.   Regina Eck, PharmD, BCPS Clinical Pharmacist, [facility] [place]: [phone]

## 2019-03-01 ENCOUNTER — Ambulatory Visit: Payer: Self-pay

## 2019-03-01 ENCOUNTER — Telehealth: Payer: Self-pay

## 2019-03-01 DIAGNOSIS — I10 Essential (primary) hypertension: Secondary | ICD-10-CM

## 2019-03-01 DIAGNOSIS — N183 Chronic kidney disease, stage 3 unspecified: Secondary | ICD-10-CM

## 2019-03-01 DIAGNOSIS — E1122 Type 2 diabetes mellitus with diabetic chronic kidney disease: Secondary | ICD-10-CM

## 2019-03-01 DIAGNOSIS — Z794 Long term (current) use of insulin: Secondary | ICD-10-CM

## 2019-03-01 NOTE — Chronic Care Management (AMB) (Signed)
  Chronic Care Management   Outreach Note  03/01/2019 Name: [name] MRN: [id number] DOB: [birth year]  Referred by: [name], MD Reason for referral : Care Coordination   An unsuccessful telephone outreach was attempted today. CCM SW has collaborated with CCM Pharmacist, Lottie Dawson regarding patients need for financial resources.  Follow Up Plan: A HIPPA compliant phone message was left for the patient providing contact information and requesting a return call.  The CM team will reach out to the patient again over the next 7-10 days.   [name], BSW, CDP [facility] / [facility] Care Management Social Worker [phone]  Total time spent performing care coordination and/or care management activities with the patient by phone or face to face = 5 minutes.

## 2019-03-04 DIAGNOSIS — N183 Chronic kidney disease, stage 3 (moderate): Secondary | ICD-10-CM | POA: Diagnosis not present

## 2019-03-04 DIAGNOSIS — Z7901 Long term (current) use of anticoagulants: Secondary | ICD-10-CM | POA: Diagnosis not present

## 2019-03-04 DIAGNOSIS — I272 Pulmonary hypertension, unspecified: Secondary | ICD-10-CM | POA: Diagnosis not present

## 2019-03-04 DIAGNOSIS — I5022 Chronic systolic (congestive) heart failure: Secondary | ICD-10-CM | POA: Diagnosis not present

## 2019-03-04 DIAGNOSIS — I447 Left bundle-branch block, unspecified: Secondary | ICD-10-CM | POA: Diagnosis not present

## 2019-03-05 ENCOUNTER — Ambulatory Visit: Payer: Self-pay

## 2019-03-05 DIAGNOSIS — E1122 Type 2 diabetes mellitus with diabetic chronic kidney disease: Secondary | ICD-10-CM

## 2019-03-05 DIAGNOSIS — N183 Chronic kidney disease, stage 3 unspecified: Secondary | ICD-10-CM

## 2019-03-05 DIAGNOSIS — N184 Chronic kidney disease, stage 4 (severe): Secondary | ICD-10-CM | POA: Diagnosis not present

## 2019-03-05 DIAGNOSIS — I10 Essential (primary) hypertension: Secondary | ICD-10-CM

## 2019-03-05 DIAGNOSIS — Z170 Estrogen receptor positive status [ER+]: Secondary | ICD-10-CM

## 2019-03-05 DIAGNOSIS — I5031 Acute diastolic (congestive) heart failure: Secondary | ICD-10-CM | POA: Diagnosis not present

## 2019-03-05 DIAGNOSIS — C50311 Malignant neoplasm of lower-inner quadrant of right female breast: Secondary | ICD-10-CM | POA: Diagnosis not present

## 2019-03-05 DIAGNOSIS — Z794 Long term (current) use of insulin: Secondary | ICD-10-CM

## 2019-03-05 DIAGNOSIS — G5620 Lesion of ulnar nerve, unspecified upper limb: Secondary | ICD-10-CM | POA: Diagnosis not present

## 2019-03-05 NOTE — Chronic Care Management (AMB) (Signed)
  Chronic Care Management   Social Work Note  03/05/2019 Name: [name] MRN: [id number] DOB: [birth year]  [name] is a 85 y.o. year old female who sees Stephanie Chard, MD for primary care. The CCM team was consulted for assistance with [employer].   I placed a follow up call to the patients grand-daughter and caregiver [name] in response to request by CCM RPh Lottie Dawson to assist with identifying financial resources for the patient. During today's call it is reported the patient is lying down and not feeling well. [name] further reports the patient was seen by a cardiologist yesterday (5/11) and had a very slow heart rate. It is indicated the patient also had changes to her kidney function which resulted in two medications being stopped. [name] reported the cardiologist informed the patient she "has fluid around her heart causing pressure on her lungs". [name] reports the patient will follow up with cardiology in two weeks for a repeat EKG.  At this time the patient is reported to be experiencing pain in her left shoulder, neck, and leg. The patient also has edema to the lower extremities. CCM SW advised [name] to report these symptoms to the patients primary physician. [name] confirmed the patient would have a virtual visit today at 10:45 am with Dr. Delice Lesch and stated "that is her primary doctor up here". CCM SW ended the call with [facility] as it was 10:47am at the time of CCM call.  CCM SW has collaborated with [place] regarding patients reported symptoms. Upon chart review, CCM SW noted the patient had been seen in the ED March of 2020 and was discharged with strict instructions regarding heart failure and a low sodium diet. CCM RN Case Manager will follow up with the patient and her caregiver.  Follow Up Plan: No scheduled follow up by CCM SW at this time. CCM SW has collaborated with CCM RN Case Freight forwarder, [place], who will update CCM SW if future follow up is needed.   [name], BSW, CDP [facility] / [facility] Care Management Social Worker [phone]  Total time spent performing care coordination and/or care management activities with the patient by phone or face to face = 14 minutes.

## 2019-03-05 NOTE — Patient Instructions (Addendum)
Visit Information  Goals Addressed    . [name] High Risk for Knowledge Deficit for CHF          Current Barriers:  [name] Knowledge Deficits related to disease process and Self Health management for CHF   Nurse Case Manager Clinical Goal(s):  [name] Over the next 30 days, patient will verbalize basic understanding of CHF disease process and self health management plan as evidenced by patient and caregiver will verbalize better understanding of the disease process for CHF and will verbalize increased understanding of MD recommendations for CHF disease management.   Interventions:  . Collaborated with embedded BSW [name] regarding a status update related to worsening Cardiac signs and symptoms of potential CHF and patient/caregiver's lack of knowledge related to this diagnosis/disease process . Discussed patient followed up with a local Cardiologist on 03/04/19; discussed patient will follow up with Dr. Duncan Dull, family medicine today  . Scheduled a CCM follow up call to patient tomorrow, 03/06/19 to offer additional education for CHF disease management and support   Patient Self Care Activities:  . Attends all scheduled provider appointments . Calls pharmacy for medication refills . Calls provider office for new concerns or questions  Initial goal documentation       The CCM team will reach out to the patient/caregiver on 03/06/19  [name], [facility] Care Management Coordinator [place] [facility]  Direct Phone: [phone]

## 2019-03-05 NOTE — Chronic Care Management (AMB) (Addendum)
  Chronic Care Management   Follow Up Note   03/05/2019 Name: [name] MRN: [id number] DOB: 09-18-1933  Referred by: [name], MD Reason for referral : Chronic Care Management (CCM team collaboration)   CARLINA DERKS is a 85 y.o. year old female who is a primary care patient of [name], MD. The CCM team was consulted for assistance with chronic disease management and care coordination needs.    Review of patient status, including review of consultants reports, relevant laboratory and other test results, and collaboration with appropriate care team members and the patient's provider was performed as part of comprehensive patient evaluation and provision of chronic care management services.    CCM collaboration with embedded [place] regarding patient status change.   Goals Addressed    . High Risk for Knowledge Deficit for CHF        Current Barriers:  [name] Knowledge Deficits related to disease process and Self Health management for CHF   Nurse Case Manager Clinical Goal(s):  [name] Over the next 30 days, patient will verbalize basic understanding of CHF disease process and self health management plan as evidenced by patient and caregiver will verbalize better understanding of the disease process for CHF and will verbalize increased understanding of MD recommendations for CHF disease management.   Interventions:  . Collaborated with embedded BSW [name] regarding a status update related to worsening Cardiac signs and symptoms of potential CHF and patient/caregiver's lack of knowledge related to this diagnosis/disease process.  . Discussed patient followed up with a local Cardiologist on 03/04/19; discussed patient will follow up with Dr. Duncan Dull, family medicine today  . Scheduled a CCM follow up call to patient tomorrow, 03/06/19 to offer additional education for CHF disease management and support   Patient Self Care Activities:  . Attends all scheduled provider  appointments . Calls pharmacy for medication refills . Calls provider office for new concerns or questions  Initial goal documentation        Telephone follow up appointment with CCM team member scheduled for: 03/08/19  [name], [facility] Care Management Coordinator [place] [facility]  Direct Phone: [phone]

## 2019-03-06 ENCOUNTER — Telehealth: Payer: Self-pay

## 2019-03-07 ENCOUNTER — Other Ambulatory Visit: Payer: Self-pay

## 2019-03-07 ENCOUNTER — Ambulatory Visit: Payer: Self-pay

## 2019-03-07 ENCOUNTER — Telehealth: Payer: Self-pay

## 2019-03-07 DIAGNOSIS — I10 Essential (primary) hypertension: Secondary | ICD-10-CM | POA: Diagnosis not present

## 2019-03-07 DIAGNOSIS — I130 Hypertensive heart and chronic kidney disease with heart failure and stage 1 through stage 4 chronic kidney disease, or unspecified chronic kidney disease: Secondary | ICD-10-CM

## 2019-03-07 DIAGNOSIS — C50311 Malignant neoplasm of lower-inner quadrant of right female breast: Secondary | ICD-10-CM | POA: Diagnosis not present

## 2019-03-07 DIAGNOSIS — E1122 Type 2 diabetes mellitus with diabetic chronic kidney disease: Secondary | ICD-10-CM | POA: Diagnosis not present

## 2019-03-07 DIAGNOSIS — Z170 Estrogen receptor positive status [ER+]: Secondary | ICD-10-CM

## 2019-03-07 DIAGNOSIS — N184 Chronic kidney disease, stage 4 (severe): Secondary | ICD-10-CM | POA: Diagnosis not present

## 2019-03-07 DIAGNOSIS — Z794 Long term (current) use of insulin: Secondary | ICD-10-CM

## 2019-03-07 MED ORDER — POTASSIUM CHLORIDE CRYS ER 20 MEQ PO TBCR: 40.0000 meq | EXTENDED_RELEASE_TABLET | Freq: Every day | ORAL | 1 refills | Status: DC

## 2019-03-08 ENCOUNTER — Telehealth: Payer: Self-pay

## 2019-03-08 DIAGNOSIS — M47812 Spondylosis without myelopathy or radiculopathy, cervical region: Secondary | ICD-10-CM | POA: Diagnosis not present

## 2019-03-08 DIAGNOSIS — I5031 Acute diastolic (congestive) heart failure: Secondary | ICD-10-CM | POA: Diagnosis not present

## 2019-03-08 NOTE — Patient Instructions (Signed)
Visit Information  Goals Addressed      Patient Stated   . "I check my sugars three times a day" (pt-stated)       Current Barriers:  [name] Knowledge Deficits related to disease process and Self Health management for Diabetes  Nurse Case Manager Clinical Goal(s):  [name] Over the next 30 days, patient will verbalize basic understanding of Diabetes disease process and self health management plan as evidenced by patient will verbalize how to meal plan and follow a low carb Diabetic diet, monitor CBG's, take insulin exactly as prescribed and verbalize knowing when to call the doctor.   Interventions:   Completed CCM RN telephone outreach with granddaughter [name] . Evaluation of current treatment plan related to Diabetes and patient's adherence to plan as established by provider . Discussed plans with patient for ongoing care management follow up and provided patient with direct contact information for care management team . Confirmed patient received printed educational materials related to Meal Planning using the plate method, Knowing when to call the doctor and Knowing s/s of hypo/hyperglycemia - pt did receive and is reviewing-denies questions during this encounter . Reinforced rationale, to check cbg preprandial each am and record, calling RN CM and or PCP for findings outside established parameters . Provided RN CM contact # and discussed nurse availability . Scheduled a CCM telephone follow up with patient for about 2 weeks  Patient Self Care Activities:   Verbalizes understanding of the education/information provided today  . Self administers medications as prescribed . Attends all scheduled provider appointments . Calls pharmacy for medication refills . Performs ADL's independently . Performs IADL's independently . Calls provider office for new concerns or questions  Please see past updates related to this goal by clicking on the "Past Updates" button in the selected goal      . 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  . Over the next 30 days, patient will attend all scheduled medical appointments: including follow up at [facility] for 2nd opinion of breast neoplasm scheduled for 03/04/19.   Interventions:   Completed CCM RN telephone outreach with granddaughter [name] . Evaluation of current treatment plan related to diagnosis and treatment for right breast neoplasm and patient's adherence to plan as established by provider . Discussed plans with patient for ongoing care management follow up and provided patient with direct contact information for care management team  Reviewed scheduled/upcoming provider appointments including: new patient 2nd opinion; [name], [place]; rescheduled to 03/13/19  Provided patient with RN CM contact # and discussed hours of availability  Scheduled a CCM telephone follow up call with patient for about 2 weeks  Patient Self Care Activities:  . Self administers medications as prescribed . Attends all scheduled provider appointments . Calls pharmacy for medication refills . Performs ADL's independently . Performs IADL's independently . Calls provider office for new concerns or questions  Please see past updates related to this goal by clicking on the "Past Updates" button in the selected goal       Other   . Marland KitchenHigh Risk for Knowledge Deficit for CHF        Current Barriers:  .  Knowledge Deficits related to disease process and Self Health management for CHF   Nurse Case Manager Clinical Goal(s):  [name] Over the next 30 days, patient will verbalize basic understanding of  CHF disease process and self health management plan as evidenced by patient and caregiver will verbalize better understanding of the disease process for CHF and will verbalize increased understanding of MD recommendations for CHF disease management.   Interventions:   Completed CCM RN Telephone Follow Up with granddaughter [name] . Evaluation of current treatment plan related to CHF and bradycardia and patient's adherence to plan as established by provider. . Provided education to patient re: disease process and treatment management related to CHF . Reviewed medications with patient and discussed medication changes recommended by Dr. Duncan Dull and Dr. Elesa Massed . Collaborated with [name]  regarding refill request for Potassium . Discussed plans with patient for ongoing care management follow up and provided patient with direct contact information for care management team . Provided patient with printed  educational materials related to CHF . Reviewed scheduled/upcoming provider appointments including: virtual and Face to Face visits scheduled with Dr. Duncan Dull, PCP in [place], [name] in [place] and  Dr. Gevena Cotton, Cardiologist set for 03/13/19 & 03/18/19  . Advised patient, providing education and rationale, to weigh daily and record, calling Dr. Duncan Dull and or Dr. Elesa Massed for weight gain of 3lbs overnight or 5 pounds in a week.  . Scheduled a CCM RN follow up call with patient/granddaughter following face to face visit with Cardiologist on 03/18/19  Patient Self Care Activities:  . Attends all scheduled provider appointments . Calls pharmacy for medication refills . Calls provider office for new concerns or questions  Please see past updates related to this goal by clicking on the "Past Updates" button in the selected goal        The patient verbalized understanding of instructions provided today and declined a print copy of patient instruction materials.   The CCM  team will reach out to the patient again over the next 7-14 days.   [name], RN,CCM Care Management Coordinator [facility]  Direct Phone: [phone]

## 2019-03-08 NOTE — Chronic Care Management (AMB) (Signed)
Chronic Care Management   Follow Up Note   03/07/2019 Name: [name] MRN: [id number] DOB: [birth year]  Referred by: Stephanie Chard, MD Reason for referral : Chronic Care Management (CCM RN Telephone Follow Up )   [name] is a 85 y.o. year old female who is a primary care patient of Stephanie Chard, MD. The CCM team was consulted for assistance with chronic disease management and care coordination needs.    Review of patient status, including review of consultants reports, relevant laboratory and other test results, and collaboration with appropriate care team members and the patient's provider was performed as part of comprehensive patient evaluation and provision of chronic care management services.    I spoke with [name] by telephone today ( patient's granddaughter).   Goals Addressed      Patient Stated   . "I check my sugars three times a day" (pt-stated)       Current Barriers:  [name] Knowledge Deficits related to disease process and Self Health management for Diabetes  Nurse Case Manager Clinical Goal(s):  [name] Over the next 30 days, patient will verbalize basic understanding of Diabetes disease process and self health management plan as evidenced by patient will verbalize how to meal plan and follow a low carb Diabetic diet, monitor CBG's, take insulin exactly as prescribed and verbalize knowing when to call the doctor.   Interventions:   Completed CCM RN telephone outreach with [name] . Evaluation of current treatment plan related to Diabetes and patient's adherence to plan as established by provider . Discussed plans with patient for ongoing care management follow up and provided patient with direct contact information for care management team . Confirmed patient received printed educational materials related to Meal Planning using the plate method, Knowing when to call the doctor and Knowing s/s of hypo/hyperglycemia - pt did  receive and is reviewing-denies questions during this encounter . Reinforced rationale, to check cbg preprandial each am and record, calling RN CM and or PCP for findings outside established parameters . Provided RN CM contact # and discussed nurse availability . Scheduled a CCM telephone follow up with patient for about 2 weeks  Patient Self Care Activities:   Verbalizes understanding of the education/information provided today  . Self administers medications as prescribed . Attends all scheduled provider appointments . Calls pharmacy for medication refills . Performs ADL's independently . Performs IADL's independently . Calls provider office for new concerns or questions  Please see past updates related to this goal by clicking on the "Past Updates" button in the selected goal     . 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  . Over the next 30 days, patient will attend all scheduled medical appointments: including follow up at [name] for 2nd opinion of breast neoplasm scheduled for 03/04/19.   Interventions:   Completed CCM RN telephone outreach with [name] . Evaluation of current treatment plan related to diagnosis and treatment for right breast neoplasm and patient's adherence to plan as established by provider . Discussed plans with patient for ongoing care management follow up and provided patient with direct contact information for care  management team  Reviewed scheduled/upcoming provider appointments including: new patient 2nd opinion; [name], [name]; rescheduled to 03/13/19  Provided patient with RN CM  contact # and discussed hours of availability  Scheduled a CCM telephone follow up call with patient for about 2 weeks  Patient Self Care Activities:  . Self administers medications as prescribed . Attends all scheduled provider appointments . Calls pharmacy for medication refills . Performs ADL's independently . Performs IADL's independently . Calls provider office for new concerns or questions  Please see past updates related to this goal by clicking on the "Past Updates" button in the selected goal       Other   . Marland KitchenHigh Risk for Knowledge Deficit for CHF        Current Barriers:  [name] Knowledge Deficits related to disease process and Self Health management for CHF   Nurse Case Manager Clinical Goal(s):  [name] Over the next 30 days, patient will verbalize basic understanding of CHF disease process and self health management plan as evidenced by patient and caregiver will verbalize better understanding of the disease process for CHF and will verbalize increased understanding of MD recommendations for CHF disease management.   Interventions:   Completed CCM RN Telephone Follow Up with [name] . Evaluation of current treatment plan related to CHF and bradycardia and patient's adherence to plan as established by provider. . Provided education to patient re: disease process and treatment management related to CHF . Reviewed medications with patient and discussed medication changes recommended by [name] and [name] . Collaborated with [name]  regarding refill request for Potassium . Discussed plans with patient for ongoing care management follow up and provided patient with direct contact information for care management team . Provided patient with printed  educational materials related to CHF . Reviewed scheduled/upcoming provider appointments including: virtual and Face to Face visits scheduled with [name], PCP in [name], [name] in [name]  and  [name], Cardiologist set for 03/13/19 & 03/18/19  . Advised patient, providing education and rationale, to weigh daily and record, calling [name] and or [name] for weight gain of 3lbs overnight or 5 pounds in a week.  . Scheduled a CCM RN follow up call with patient/granddaughter following face to face visit with Cardiologist on 03/18/19  Patient Self Care Activities:  . Attends all scheduled provider appointments . Calls pharmacy for medication refills . Calls provider office for new concerns or questions  Please see past updates related to this goal by clicking on the "Past Updates" button in the selected goal         The CCM team will reach out to the patient again over the next 7-14 days.   [name], RN,CCM Care Management Coordinator [facility]  Direct Phone: [phone]

## 2019-03-10 DIAGNOSIS — I110 Hypertensive heart disease with heart failure: Secondary | ICD-10-CM | POA: Diagnosis not present

## 2019-03-12 ENCOUNTER — Telehealth: Payer: Self-pay

## 2019-03-12 ENCOUNTER — Encounter: Payer: Self-pay | Admitting: *Deleted

## 2019-03-12 NOTE — Progress Notes (Signed)
Received request from [facility] for pathology material and medical records, faxed records to [phone] and sent pathology request to pathology, confirmation received.

## 2019-03-13 ENCOUNTER — Telehealth: Payer: Self-pay

## 2019-03-13 ENCOUNTER — Telehealth: Payer: Self-pay | Admitting: *Deleted

## 2019-03-13 DIAGNOSIS — N183 Chronic kidney disease, stage 3 (moderate): Secondary | ICD-10-CM | POA: Diagnosis not present

## 2019-03-13 DIAGNOSIS — C50911 Malignant neoplasm of unspecified site of right female breast: Secondary | ICD-10-CM | POA: Diagnosis not present

## 2019-03-13 DIAGNOSIS — I480 Paroxysmal atrial fibrillation: Secondary | ICD-10-CM | POA: Diagnosis not present

## 2019-03-13 DIAGNOSIS — Z7901 Long term (current) use of anticoagulants: Secondary | ICD-10-CM | POA: Diagnosis not present

## 2019-03-13 DIAGNOSIS — R2243 Localized swelling, mass and lump, lower limb, bilateral: Secondary | ICD-10-CM | POA: Diagnosis not present

## 2019-03-13 NOTE — Telephone Encounter (Signed)
On 03-13-19 fax medical records to [facility], it was the breast clinic note from 11-28-18

## 2019-03-15 ENCOUNTER — Telehealth: Payer: Self-pay

## 2019-03-18 DIAGNOSIS — R600 Localized edema: Secondary | ICD-10-CM | POA: Diagnosis not present

## 2019-03-18 DIAGNOSIS — I429 Cardiomyopathy, unspecified: Secondary | ICD-10-CM | POA: Diagnosis not present

## 2019-03-18 DIAGNOSIS — N183 Chronic kidney disease, stage 3 (moderate): Secondary | ICD-10-CM | POA: Diagnosis not present

## 2019-03-18 DIAGNOSIS — Z01810 Encounter for preprocedural cardiovascular examination: Secondary | ICD-10-CM | POA: Diagnosis not present

## 2019-03-19 ENCOUNTER — Telehealth: Payer: Self-pay

## 2019-03-19 ENCOUNTER — Other Ambulatory Visit: Payer: Self-pay

## 2019-03-19 ENCOUNTER — Other Ambulatory Visit: Payer: Self-pay | Admitting: Internal Medicine

## 2019-03-19 ENCOUNTER — Ambulatory Visit: Payer: Self-pay

## 2019-03-19 DIAGNOSIS — C50311 Malignant neoplasm of lower-inner quadrant of right female breast: Secondary | ICD-10-CM

## 2019-03-19 DIAGNOSIS — I10 Essential (primary) hypertension: Secondary | ICD-10-CM

## 2019-03-19 DIAGNOSIS — N189 Chronic kidney disease, unspecified: Secondary | ICD-10-CM | POA: Diagnosis not present

## 2019-03-19 DIAGNOSIS — N184 Chronic kidney disease, stage 4 (severe): Secondary | ICD-10-CM | POA: Diagnosis not present

## 2019-03-19 DIAGNOSIS — I130 Hypertensive heart and chronic kidney disease with heart failure and stage 1 through stage 4 chronic kidney disease, or unspecified chronic kidney disease: Secondary | ICD-10-CM | POA: Diagnosis not present

## 2019-03-19 DIAGNOSIS — Z794 Long term (current) use of insulin: Secondary | ICD-10-CM

## 2019-03-19 DIAGNOSIS — D519 Vitamin B12 deficiency anemia, unspecified: Secondary | ICD-10-CM | POA: Diagnosis not present

## 2019-03-19 DIAGNOSIS — E1122 Type 2 diabetes mellitus with diabetic chronic kidney disease: Secondary | ICD-10-CM | POA: Diagnosis not present

## 2019-03-19 DIAGNOSIS — D529 Folate deficiency anemia, unspecified: Secondary | ICD-10-CM | POA: Diagnosis not present

## 2019-03-19 DIAGNOSIS — Z170 Estrogen receptor positive status [ER+]: Secondary | ICD-10-CM

## 2019-03-19 DIAGNOSIS — D509 Iron deficiency anemia, unspecified: Secondary | ICD-10-CM | POA: Diagnosis not present

## 2019-03-20 ENCOUNTER — Ambulatory Visit: Payer: Self-pay | Admitting: Pharmacist

## 2019-03-20 DIAGNOSIS — E1122 Type 2 diabetes mellitus with diabetic chronic kidney disease: Secondary | ICD-10-CM

## 2019-03-20 DIAGNOSIS — I10 Essential (primary) hypertension: Secondary | ICD-10-CM

## 2019-03-20 DIAGNOSIS — N184 Chronic kidney disease, stage 4 (severe): Secondary | ICD-10-CM | POA: Diagnosis not present

## 2019-03-20 DIAGNOSIS — Z794 Long term (current) use of insulin: Secondary | ICD-10-CM | POA: Diagnosis not present

## 2019-03-20 NOTE — Chronic Care Management (AMB) (Signed)
Chronic Care Management   Follow Up Note   03/19/2019 Name: [name] MRN: [id number] DOB: 22-Jul-1933  Referred by: Stephanie Chard, MD Reason for referral : Chronic Care Management (CCM RN Telephone Follow Up)   [name] is a 85 y.o. year old female who is a primary care patient of Stephanie Chard, MD. The CCM team was consulted for assistance with chronic disease management and care coordination needs.    Review of patient status, including review of consultants reports, relevant laboratory and other test results, and collaboration with appropriate care team members and the patient's provider was performed as part of comprehensive patient evaluation and provision of chronic care management services.    I spoke with [name] and her [name] today by telephone for a CCM follow up.   Goals Addressed      Patient Stated   . "I check my sugars three times a day" (pt-stated)       Current Barriers:  [name] Knowledge Deficits related to disease process and Self Health management for Diabetes  Nurse Case Manager Clinical Goal(s):  [name] Over the next 30 days, patient will verbalize basic understanding of Diabetes disease process and self health management plan as evidenced by patient will verbalize how to meal plan and follow a low carb Diabetic diet, monitor CBG's, take insulin exactly as prescribed and verbalize knowing when to call the doctor.   CCM RN CM Interventions:  Completed on 03/19/19: completed with patient and [name]   Reviewed patient's BG log; discussed patient continues to have BG < 70 and > 250  Reviewed patient's prescribed insulin regimen  Confirmed patient received printed DM materials related to Meal Planning and using the Plate Method (pt is using a sectioned plate)  Discussed ongoing CCM follow up   Sent message to embedded Pharm D [name] with patient update  Scheduled a follow up call with patient and [name] for 1-2  weeks  Patient Self Care Activities:   Verbalizes understanding of the education/information provided today  . Self administers medications as prescribed . Attends all scheduled provider appointments . Calls pharmacy for medication refills . Performs ADL's independently . Performs IADL's independently . Calls provider office for new concerns or questions  Please see past updates related to this goal by clicking on the "Past Updates" button in the selected goal      . 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  . Over the next 30 days, patient will attend all scheduled medical appointments: including follow up at [facility] for 2nd opinion of breast neoplasm scheduled for 03/04/19.   CCM RN CM Interventions:  Completed on 03/19/19: completed call with patient and [name]  . Evaluation of current treatment plan related to diagnosis and treatment for right breast neoplasm and patient's adherence to plan as established by provider  Reviewed scheduled/upcoming provider appointments including ongoing Oncology follow up at [facility] for understanding of the prescribed treatment plan as recommended by Oncologist  Discussed plans with patient for ongoing care management follow up and provided patient with direct contact information for care management team  Confirmed patient/granddaughter RN CM  contact # and discussed hours of availability  Scheduled a CCM telephone follow up call with patient/granddaughter for about 1-2 weeks  Patient Self Care Activities:  . Self administers medications as prescribed . Attends all scheduled  provider appointments . Calls pharmacy for medication refills . Performs ADL's independently . Performs IADL's independently . Calls provider office for new concerns or questions  Please see past updates related to this goal by clicking on the "Past Updates" button in the selected goal      . I think my sugar has been higher recently (pt-stated)       Current Barriers:  . Non Adherence to prescribed diabetes diet/regimen (most recent A1c 8.4)  Pharmacist Clinical Goal(s):  [name] Over the next 60 days, patient will demonstrate Improved medication/diet adherence as evidenced by decreased A1c/improved blood sugar readings.  Interventions: . Comprehensive medication review performed. . Advised patient to eat DM healthy diet, continue taking medications as prescribed.   CCM RN CM Interventions:  Completed on 03/19/19: completed with patient and [name]   Reviewed patient's BG log; discussed patient continues to have BG < 70 and > 250  Reviewed patient's prescribed insulin regimen  Confirmed patient received printed DM materials related to Meal Planning and using the Plate Method (pt is using a sectioned plate)  Discussed ongoing CCM follow up   Sent message to embedded Pharm D [name] with patient update  Scheduled a follow up call with patient and [name] for 1-2 weeks  Patient Self Care Activities: with granddaughter assistance . Self administers medications as prescribed . Attends all scheduled provider appointments . Calls pharmacy for medication refills  Please see past updates related to this goal by clicking on the "Past Updates" button in the selected goal        Other   . Marland KitchenHigh Risk for Knowledge Deficit for CHF        Current Barriers:  [name] Knowledge Deficits related to disease process and Self Health management for CHF   Nurse Case Manager Clinical Goal(s):  [name] Over the next 30 days, patient will verbalize basic understanding of CHF disease  process and self health management plan as evidenced by patient and caregiver will verbalize better understanding of the disease process for CHF and will verbalize increased understanding of MD recommendations for CHF disease management.   CCM RN CM Interventions:  Completed on 03/19/19: completed with patient and [name]  . Evaluation of current treatment plan related to CHF and bradycardia and patient's adherence to plan as established by provider (patient ordered by Cardiologist to wear 24 hr Holter monitor; granddaughter is monitoring BP/HR, reports HR has improved with average HR running in 60's) . Reinforced education to patient/[name] re: disease process and treatment management related to CHF (daily weights, reporting wt gain of 3 lbs in 1 day or 5 lbs in 1 week; increased edema in lower extremities, abdomen, hands, nausea, shortness of breath) (pt has not purchased a weight scale but plans to next week when she gets paid) . Reviewed medications with patient and discussed medication changes recommended by Dr. Duncan Dull and Dr. Elesa Massed . Reinforced importance of adhering to a strict low Sodium diet and provided rationale . Discussed plans with patient for ongoing care management follow up and provided patient with direct contact information for care management team . Confirmed patient received printed educational materials related to CHF . Reviewed scheduled/upcoming provider appointments including ongoing follow up with [name], Cardiology, PCP and [name] (  home PCP)  Scheduled a CCM RN follow up call with patient/granddaughter for about 1-2 weeks    Patient Self Care Activities:  . Attends all scheduled provider appointments . Calls pharmacy for medication refills . Calls provider office for new concerns or questions  Please see past updates related to this goal by clicking on the "Past Updates" button in the selected goal          The CCM team will  reach out to the patient again over the next 1-2 weeks.   [name], RN,CCM Care Management Coordinator [facility]  Direct Phone: [phone]

## 2019-03-20 NOTE — Patient Instructions (Signed)
Visit Information  Goals Addressed      Patient Stated   . "I check my sugars three times a day" (pt-stated)       Current Barriers:  [name] Knowledge Deficits related to disease process and Self Health management for Diabetes  Nurse Case Manager Clinical Goal(s):  [name] Over the next 30 days, patient will verbalize basic understanding of Diabetes disease process and self health management plan as evidenced by patient will verbalize how to meal plan and follow a low carb Diabetic diet, monitor CBG's, take insulin exactly as prescribed and verbalize knowing when to call the doctor.   CCM RN CM Interventions:  Completed on 03/19/19: completed with patient and granddaughter [name]   Reviewed patient's BG log; discussed patient continues to have BG < 70 and > 250  Reviewed patient's prescribed insulin regimen  Confirmed patient received printed DM materials related to Meal Planning and using the Plate Method (pt is using a sectioned plate)  Discussed ongoing CCM follow up   Sent message to embedded Pharm D Lottie Dawson with patient update  Scheduled a follow up call with patient and granddaughter [name] for 1-2 weeks  Patient Self Care Activities:   Verbalizes understanding of the education/information provided today  . Self administers medications as prescribed . Attends all scheduled provider appointments . Calls pharmacy for medication refills . Performs ADL's independently . Performs IADL's independently . Calls provider office for new concerns or questions  Please see past updates related to this goal by clicking on the "Past Updates" button in the selected goal     . "I want a second opinion about my cancer" (pt-stated)       Current Barriers:  Knowledge Deficits related to treatment management for malignant neoplasm of lower-inner quadrant or right breast  Nurse Case Manager Clinical Goal(s):  [name] Over the next 30 days, patient will verbalize understanding of plan for diagnosis and  treatment for malignant neoplasm of lower right breast.  . Over the next 30 days, patient will attend all scheduled medical appointments: including follow up at [facility] for 2nd opinion of breast neoplasm scheduled for 03/04/19.   CCM RN CM Interventions:  Completed on 03/19/19: completed call with patient and granddaughter [name]  . Evaluation of current treatment plan related to diagnosis and treatment for right breast neoplasm and patient's adherence to plan as established by provider  Reviewed scheduled/upcoming provider appointments including ongoing Oncology follow up at [facility] for understanding of the prescribed treatment plan as recommended by Oncologist  Discussed plans with patient for ongoing care management follow up and provided patient with direct contact information for care management team  Confirmed patient/granddaughter RN CM contact # and discussed hours of availability  Scheduled a CCM telephone follow up call with patient/granddaughter for about 1-2 weeks  Patient Self Care Activities:  . Self administers medications as prescribed . Attends all scheduled provider appointments . Calls pharmacy for medication refills . Performs ADL's independently . Performs IADL's independently . Calls provider office for new concerns or questions  Please see past updates related to this goal by clicking on the "Past Updates" button in the selected goal      . I think my sugar has been higher recently (pt-stated)       Current Barriers:  . Non Adherence to prescribed diabetes diet/regimen (most recent A1c 8.4)  Pharmacist Clinical Goal(s):  [name] Over the next 60 days, patient will demonstrate Improved medication/diet adherence as evidenced by decreased  A1c/improved blood sugar readings.  Interventions: . Comprehensive medication review performed. . Advised patient to eat DM healthy diet, continue taking medications as prescribed.   CCM RN CM Interventions:   Completed on 03/19/19: completed with patient and granddaughter [name]   Reviewed patient's BG log; discussed patient continues to have BG < 70 and > 250  Reviewed patient's prescribed insulin regimen  Confirmed patient received printed DM materials related to Meal Planning and using the Plate Method (pt is using a sectioned plate)  Discussed ongoing CCM follow up   Sent message to embedded Pharm D Lottie Dawson with patient update  Scheduled a follow up call with patient and granddaughter [name] for 1-2 weeks  Patient Self Care Activities: with granddaughter assistance . Self administers medications as prescribed . Attends all scheduled provider appointments . Calls pharmacy for medication refills  Please see past updates related to this goal by clicking on the "Past Updates" button in the selected goal        Other   . Marland KitchenHigh Risk for Knowledge Deficit for CHF        Current Barriers:  [name] Knowledge Deficits related to disease process and Self Health management for CHF   Nurse Case Manager Clinical Goal(s):  [name] Over the next 30 days, patient will verbalize basic understanding of CHF disease process and self health management plan as evidenced by patient and caregiver will verbalize better understanding of the disease process for CHF and will verbalize increased understanding of MD recommendations for CHF disease management.   CCM RN CM Interventions:  Completed on 03/19/19: completed with patient and granddaughter [name]  . Evaluation of current treatment plan related to CHF and bradycardia and patient's adherence to plan as established by provider (patient ordered by Cardiologist to wear 24 hr Holter monitor; granddaughter is monitoring BP/HR, reports HR has improved with average HR running in 60's) . Reinforced education to patient/granddaughter [name] re: disease process and treatment management related to CHF (daily weights, reporting wt gain of 3 lbs in 1 day or 5 lbs in 1 week;  increased edema in lower extremities, abdomen, hands, nausea, shortness of breath) (pt has not purchased a weight scale but plans to next week when she gets paid) . Reviewed medications with patient and discussed medication changes recommended by Dr. Duncan Dull and Dr. Elesa Massed . Reinforced importance of adhering to a strict low Sodium diet and provided rationale . Discussed plans with patient for ongoing care management follow up and provided patient with direct contact information for care management team . Confirmed patient received printed educational materials related to CHF . Reviewed scheduled/upcoming provider appointments including ongoing follow up with [name], Cardiology, PCP and [name] (home PCP)  Scheduled a CCM RN follow up call with patient/granddaughter for about 1-2 weeks    Patient Self Care Activities:  . Attends all scheduled provider appointments . Calls pharmacy for medication refills . Calls provider office for new concerns or questions  Please see past updates related to this goal by clicking on the "Past Updates" button in the selected goal          The patient verbalized understanding of instructions provided today and declined a print copy of patient instruction materials.   The CCM team will reach out to the patient again over the next 1-2 weeks.   [name], RN,CCM Care Management Coordinator [facility]  Direct Phone: [phone]

## 2019-03-21 ENCOUNTER — Ambulatory Visit (INDEPENDENT_AMBULATORY_CARE_PROVIDER_SITE_OTHER): Payer: Medicare Other | Admitting: Internal Medicine

## 2019-03-21 ENCOUNTER — Other Ambulatory Visit: Payer: Self-pay

## 2019-03-21 ENCOUNTER — Encounter: Payer: Self-pay | Admitting: Internal Medicine

## 2019-03-21 VITALS — BP 168/83 | HR 59 | Ht 65.0 in

## 2019-03-21 DIAGNOSIS — I5032 Chronic diastolic (congestive) heart failure: Secondary | ICD-10-CM

## 2019-03-21 DIAGNOSIS — N184 Chronic kidney disease, stage 4 (severe): Secondary | ICD-10-CM

## 2019-03-21 DIAGNOSIS — E1122 Type 2 diabetes mellitus with diabetic chronic kidney disease: Secondary | ICD-10-CM | POA: Diagnosis not present

## 2019-03-21 DIAGNOSIS — Z794 Long term (current) use of insulin: Secondary | ICD-10-CM

## 2019-03-21 DIAGNOSIS — Z170 Estrogen receptor positive status [ER+]: Secondary | ICD-10-CM

## 2019-03-21 DIAGNOSIS — I130 Hypertensive heart and chronic kidney disease with heart failure and stage 1 through stage 4 chronic kidney disease, or unspecified chronic kidney disease: Secondary | ICD-10-CM

## 2019-03-21 DIAGNOSIS — K5900 Constipation, unspecified: Secondary | ICD-10-CM | POA: Diagnosis not present

## 2019-03-21 DIAGNOSIS — C50311 Malignant neoplasm of lower-inner quadrant of right female breast: Secondary | ICD-10-CM

## 2019-03-21 NOTE — Progress Notes (Signed)
Virtual Visit via Video   This visit type was conducted due to national recommendations for restrictions regarding the COVID-19 Pandemic (e.g. social distancing) in an effort to limit this patient's exposure and mitigate transmission in our community.  Due to her co-morbid illnesses, this patient is at least at moderate risk for complications without adequate follow up.  This format is felt to be most appropriate for this patient at this time.  All issues noted in this document were discussed and addressed.  A limited physical exam was performed with this format.    This visit type was conducted due to national recommendations for restrictions regarding the COVID-19 Pandemic (e.g. social distancing) in an effort to limit this patient's exposure and mitigate transmission in our community.  Patients identity confirmed using two different identifiers.  This format is felt to be most appropriate for this patient at this time.  All issues noted in this document were discussed and addressed.  No physical exam was performed (except for noted visual exam findings with Video Visits).    Date:  04/06/2019   ID:  [name], DOB Mar 02, 1933, MRN [id number]  Patient Location:    Provider location:   Office    Chief Complaint:  Diabetes  History of Present Illness:    [name] is a 85 y.o. female who presents via video conferencing for a telehealth visit today.    The patient does not have symptoms concerning for COVID-19 infection (fever, chills, cough, or new shortness of breath).   She presents today for virtual visit. She prefers this method of contact due to COVID-19 pandemic.  She is still in [place], living with her granddaughter. She feels fairly well. She has been busy going to specialist appointments, cardiology and oncology.   She reports that her sugars are still elevated. Granddaughter reports pt has a healthy appetite, almost too good! She reports compliance with meds. She does  admit that she has missed her insulin dose on a couple of occasions. She denies chest pain, shortness of breath and headaches. She adds that her bp has been quite elevated recently. Also reports recent eval by cardiology, medications were adjusted.      Past Medical History:  Diagnosis Date   Anemia    Arthritis    Cancer ([place])    Cataract    Chronic diastolic CHF (congestive heart failure) (HCC)    CKD (chronic kidney disease), stage III (HCC)    Clotting disorder ([place])    Diabetes mellitus ([place])    Dizziness and giddiness    DVT (deep venous thrombosis) ([place])    a. On Coumadin for this.   Essential hypertension    LBBB (left bundle branch block)    Mitral regurgitation    a. Mod MR by echo 2014.   Muscular deconditioning    Obesity    SVT (supraventricular tachycardia) ([place])    a. In 2013 she had an EPS with ablation for SVT which did not eliminate the SVT completely. She also had bradycardia which limited medication. She was placed on amiodarone by Dr. Lovena Le.   Thrombocytopenia ([place]) 11/21/2011   Past Surgical History:  Procedure Laterality Date   EYE SURGERY     SUPRAVENTRICULAR TACHYCARDIA ABLATION N/A 10/11/2012   Procedure: SUPRAVENTRICULAR TACHYCARDIA ABLATION;  Surgeon: Evans Lance, MD;  Location: [facility] CATH LAB;  Service: Cardiovascular;  Laterality: N/A;     Current Meds  Medication Sig   acetaminophen (TYLENOL) 500 MG tablet Take 1,000 mg  by mouth every 6 (six) hours as needed.   amiodarone (PACERONE) 200 MG tablet Take 1 tablet (200 mg total) by mouth daily.   anastrozole (ARIMIDEX) 1 MG tablet Take 1 tablet (1 mg total) by mouth daily.   apixaban (ELIQUIS) 5 MG TABS tablet Take 1 tablet (5 mg total) by mouth 2 (two) times daily.   atorvastatin (LIPITOR) 20 MG tablet Take 1 tablet (20 mg total) by mouth at bedtime.   BD PEN NEEDLE NANO U/F 32G X 4 MM MISC Use as directed   bumetanide (BUMEX) 1 MG tablet Take 1 mg by mouth 2 (two) times  daily.   calcium carbonate (OS-CAL) 600 MG TABS Take 600 mg by mouth daily with breakfast.    carvedilol (COREG) 12.5 MG tablet Take 1 tablet (12.5 mg total) by mouth 2 (two) times daily. (Patient not taking: Reported on 04/02/2019)   diclofenac sodium (VOLTAREN) 1 % GEL Apply 2 g topically 4 (four) times daily.   HUMALOG KWIKPEN 200 UNIT/ML SOPN Inject 4-8 Units into the skin 3 (three) times daily. Per sliding scale   LEVEMIR FLEXTOUCH 100 UNIT/ML Pen Inject 10-16 Units into the skin daily. 16 units in the am and 10 units in the evening   magnesium oxide (MAG-OX) 400 MG tablet Take 400 mg by mouth daily.   omeprazole (PRILOSEC) 40 MG capsule TAKE ONE CAPSULE BY MOUTH DAILY BEFORE A MEAL   oxybutynin (DITROPAN) 5 MG tablet Take 1 tablet (5 mg total) by mouth 2 (two) times daily.   potassium chloride SA (K-DUR) 20 MEQ tablet Take 2 tablets (40 mEq total) by mouth daily.   traMADol (ULTRAM) 50 MG tablet Take 50 mg by mouth 2 (two) times daily as needed for pain.   [DISCONTINUED] ACCU-CHEK AVIVA PLUS test strip USE AS DIRECTED TO CHECK BLOOD SUGAR QID   [DISCONTINUED] pregabalin (LYRICA) 75 MG capsule Take 75 mg by mouth at bedtime.     Allergies:   Aspirin and Penicillins   Social History   Tobacco Use   Smoking status: Never Smoker   Smokeless tobacco: Never Used  Substance Use Topics   Alcohol use: No   Drug use: No     Family Hx: The patient's family history includes Breast cancer in her maternal aunt; Cancer in her father; Heart attack in her brother; Hypertension in her brother, daughter, and sister; Stroke in her mother and sister.  ROS:   Please see the history of present illness.    Review of Systems  Constitutional: Negative.   Respiratory: Negative.   Cardiovascular: Negative.   Gastrointestinal: Positive for constipation.       She reports she has been more constipated recently. There is no associated abdominal pain. Unable to state what triggered her sx.  Admits she is not moving around much.   Neurological: Negative.   Psychiatric/Behavioral: Negative.     All other systems reviewed and are negative.   Labs/Other Tests and Data Reviewed:    Recent Labs: 08/06/2018: NT-Pro BNP 1,604 01/01/2019: ALT 26 01/15/2019: BUN 19; Creatinine, Ser 1.36; Hemoglobin 10.3; Platelets 200; Potassium 4.6; Sodium 142   Recent Lipid Panel Lab Results  Component Value Date/Time   CHOL 195 08/15/2018 12:36 PM   TRIG 56 01/02/2019 02:25 PM   HDL 81 08/15/2018 12:36 PM   CHOLHDL 2.4 08/15/2018 12:36 PM   CHOLHDL 2.6 12/11/2008 01:30 AM   LDLCALC 96 08/15/2018 12:36 PM    Wt Readings from Last 3 Encounters:  04/02/19 192 lb (  87.1 kg)  01/01/19 197 lb (89.4 kg)  12/07/18 188 lb (85.3 kg)     Exam:    Vital Signs:  BP (!) 168/83 (BP Location: Left Arm, Patient Position: Sitting, Cuff Size: Normal) Comment: pt provided   Pulse (!) 59 Comment: pt provided   Ht 5\' 5"  (1.651 m)    BMI 32.78 kg/m     Physical Exam  Constitutional: She is oriented to person, place, and time and well-developed, well-nourished, and in no distress.  HENT:  Head: Normocephalic and atraumatic.  Pulmonary/Chest: Effort normal.  Neurological: She is alert and oriented to person, place, and time.  Psychiatric: Affect normal.  Nursing note and vitals reviewed.   ASSESSMENT & PLAN:     1. Type 2 diabetes mellitus with stage 4 chronic kidney disease, with long-term current use of insulin (HCC)  Chronic. Her blood sugars are still elevated. Granddaughter reports she is hungry all the time. She may benefit from GLP-1 agent in that regard. For now, she is encouraged to consistently eat breakfast, lunch and dinner. She is encouraged to send in fasting BS so we can adjust her insulin accordingly.   2. 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 Importance of medication compliance was discussed with the patient. She is encouraged to avoid deli  meats and packaged foods which tend to be high in sodium. Cardiology note reviewed, this was previously sent by patient's granddaughter. I will have this scanned into her chart for review.   3. Chronic diastolic heart failure (HCC)  Chronic, yet stable.   4. Constipation, unspecified constipation type  Chronic. She was advised to increase her fiber intake. She may use Miralax as needed. If no relief, she will need to use Dulcolax suppository. Both patient and granddaughter understand the treatment plan.   5. Malignant neoplasm of lower-inner quadrant of right breast of female, estrogen receptor positive ([place])  Chronic, currently under care of oncologist in [place].  ' COVID-19 Education: The signs and symptoms of COVID-19 were discussed with the patient and how to seek care for testing (follow up with PCP or arrange E-visit).  The importance of social distancing was discussed today.  Patient Risk:   After full review of this patients clinical status, I feel that they are at least moderate risk at this time.  Time:   Today, I have spent 35 minutes with the patient with telehealth technology discussing above diagnoses.  This is a failed virtual visit. I was able to see the patient initially, but the audio was not working on her end. I could not hear her. Therefore, the remainder of the visit was done by phone.    Medication Adjustments/Labs and Tests Ordered: Current medicines are reviewed at length with the patient today.  Concerns regarding medicines are outlined above.   Tests Ordered: No orders of the defined types were placed in this encounter.   Medication Changes: No orders of the defined types were placed in this encounter.   Disposition:  Follow up in 6 week(s)  Signed, Maximino Greenland, MD

## 2019-03-21 NOTE — Patient Instructions (Signed)
Diabetes Mellitus and Nutrition, Adult  When you have diabetes (diabetes mellitus), it is very important to have healthy eating habits because your blood sugar (glucose) levels are greatly affected by what you eat and drink. Eating healthy foods in the appropriate amounts, at about the same times every day, can help you:  · Control your blood glucose.  · Lower your risk of heart disease.  · Improve your blood pressure.  · Reach or maintain a healthy weight.  Every person with diabetes is different, and each person has different needs for a meal plan. Your health care provider may recommend that you work with a diet and nutrition specialist (dietitian) to make a meal plan that is best for you. Your meal plan may vary depending on factors such as:  · The calories you need.  · The medicines you take.  · Your weight.  · Your blood glucose, blood pressure, and cholesterol levels.  · Your activity level.  · Other health conditions you have, such as heart or kidney disease.  How do carbohydrates affect me?  Carbohydrates, also called carbs, affect your blood glucose level more than any other type of food. Eating carbs naturally raises the amount of glucose in your blood. Carb counting is a method for keeping track of how many carbs you eat. Counting carbs is important to keep your blood glucose at a healthy level, especially if you use insulin or take certain oral diabetes medicines.  It is important to know how many carbs you can safely have in each meal. This is different for every person. Your dietitian can help you calculate how many carbs you should have at each meal and for each snack.  Foods that contain carbs include:  · Bread, cereal, rice, pasta, and crackers.  · Potatoes and corn.  · Peas, beans, and lentils.  · Milk and yogurt.  · Fruit and juice.  · Desserts, such as cakes, cookies, ice cream, and candy.  How does alcohol affect me?  Alcohol can cause a sudden decrease in blood glucose (hypoglycemia),  especially if you use insulin or take certain oral diabetes medicines. Hypoglycemia can be a life-threatening condition. Symptoms of hypoglycemia (sleepiness, dizziness, and confusion) are similar to symptoms of having too much alcohol.  If your health care provider says that alcohol is safe for you, follow these guidelines:  · Limit alcohol intake to no more than 1 drink per day for nonpregnant women and 2 drinks per day for men. One drink equals 12 oz of beer, 5 oz of wine, or 1½ oz of hard liquor.  · Do not drink on an empty stomach.  · Keep yourself hydrated with water, diet soda, or unsweetened iced tea.  · Keep in mind that regular soda, juice, and other mixers may contain a lot of sugar and must be counted as carbs.  What are tips for following this plan?    Reading food labels  · Start by checking the serving size on the "Nutrition Facts" label of packaged foods and drinks. The amount of calories, carbs, fats, and other nutrients listed on the label is based on one serving of the item. Many items contain more than one serving per package.  · Check the total grams (g) of carbs in one serving. You can calculate the number of servings of carbs in one serving by dividing the total carbs by 15. For example, if a food has 30 g of total carbs, it would be equal to 2   servings of carbs.  · Check the number of grams (g) of saturated and trans fats in one serving. Choose foods that have low or no amount of these fats.  · Check the number of milligrams (mg) of salt (sodium) in one serving. Most people should limit total sodium intake to less than 2,300 mg per day.  · Always check the nutrition information of foods labeled as "low-fat" or "nonfat". These foods may be higher in added sugar or refined carbs and should be avoided.  · Talk to your dietitian to identify your daily goals for nutrients listed on the label.  Shopping  · Avoid buying canned, premade, or processed foods. These foods tend to be high in fat, sodium,  and added sugar.  · Shop around the outside edge of the grocery store. This includes fresh fruits and vegetables, bulk grains, fresh meats, and fresh dairy.  Cooking  · Use low-heat cooking methods, such as baking, instead of high-heat cooking methods like deep frying.  · Cook using healthy oils, such as olive, canola, or sunflower oil.  · Avoid cooking with butter, cream, or high-fat meats.  Meal planning  · Eat meals and snacks regularly, preferably at the same times every day. Avoid going long periods of time without eating.  · Eat foods high in fiber, such as fresh fruits, vegetables, beans, and whole grains. Talk to your dietitian about how many servings of carbs you can eat at each meal.  · Eat 4-6 ounces (oz) of lean protein each day, such as lean meat, chicken, fish, eggs, or tofu. One oz of lean protein is equal to:  ? 1 oz of meat, chicken, or fish.  ? 1 egg.  ? ¼ cup of tofu.  · Eat some foods each day that contain healthy fats, such as avocado, nuts, seeds, and fish.  Lifestyle  · Check your blood glucose regularly.  · Exercise regularly as told by your health care provider. This may include:  ? 150 minutes of moderate-intensity or vigorous-intensity exercise each week. This could be brisk walking, biking, or water aerobics.  ? Stretching and doing strength exercises, such as yoga or weightlifting, at least 2 times a week.  · Take medicines as told by your health care provider.  · Do not use any products that contain nicotine or tobacco, such as cigarettes and e-cigarettes. If you need help quitting, ask your health care provider.  · Work with a counselor or diabetes educator to identify strategies to manage stress and any emotional and social challenges.  Questions to ask a health care provider  · Do I need to meet with a diabetes educator?  · Do I need to meet with a dietitian?  · What number can I call if I have questions?  · When are the best times to check my blood glucose?  Where to find more  information:  · American Diabetes Association: diabetes.org  · Academy of Nutrition and Dietetics: www.eatright.org  · National Institute of Diabetes and Digestive and Kidney Diseases (NIH): www.niddk.nih.gov  Summary  · A healthy meal plan will help you control your blood glucose and maintain a healthy lifestyle.  · Working with a diet and nutrition specialist (dietitian) can help you make a meal plan that is best for you.  · Keep in mind that carbohydrates (carbs) and alcohol have immediate effects on your blood glucose levels. It is important to count carbs and to use alcohol carefully.  This information is not intended to   replace advice given to you by your health care provider. Make sure you discuss any questions you have with your health care provider.  Document Released: 07/07/2005 Document Revised: 05/10/2017 Document Reviewed: 11/14/2016  Elsevier Interactive Patient Education © 2019 Elsevier Inc.

## 2019-03-25 ENCOUNTER — Other Ambulatory Visit: Payer: Self-pay | Admitting: Internal Medicine

## 2019-03-25 ENCOUNTER — Other Ambulatory Visit: Payer: Self-pay

## 2019-03-25 MED ORDER — ACCU-CHEK AVIVA PLUS VI STRP: ORAL_STRIP | 11 refills | Status: DC

## 2019-03-26 ENCOUNTER — Other Ambulatory Visit: Payer: Self-pay

## 2019-03-26 MED ORDER — ACCU-CHEK AVIVA PLUS VI STRP: ORAL_STRIP | 3 refills | Status: DC

## 2019-03-26 NOTE — Patient Instructions (Signed)
Visit Information  Goals Addressed            This Visit's Progress     Patient Stated   . My medications are too expensive (pt-stated)       Current Barriers:  . Financial Barriers  Pharmacist Clinical Goal(s):  [name] Over the next 30 days, patient will work with CCM team/PharmD to address needs related to medications (financial)  Interventions: . Comprehensive medication review performed. [name] Collaboration with health plan regarding medication benefit . Collaboration with RN Care Manager re: medications/DM  . Call placed to [facility] pharmacy-->discovered patient has FULL Extra Help/LIS meaning that her copays are $8.90 (brand meds, 106-month supply) and $3.40 (generic meds, 68-month supply).  Unfortunately, she does not qualify for manufacturers programs due to FULL LIS/extra help.   . Will request new RX written for Lantus pens & Humalog pens qty: 30mL (8 insulin pens=90 day supply).  New RXs not needed until June 2020 since 90-day supply filled April 2020-->will follow . Will collaborate with CCM BSW regarding Medicaid and other resources. . Will continue to explore cost saving options . Will ensure all RXs are written for 5-month supplies.  Patient Self Care Activities:  . Self administers medications as prescribed . Attends all scheduled provider appointments . Calls pharmacy for medication refills  Please see past updates related to this goal by clicking on the "Past Updates" button in the selected goal      . My sugars are running higher (pt-stated)       Current Barriers:  . Non Adherence to diabetic health diet  Pharmacist Clinical Goal(s):  [name] Over the next 30 days, patient will work with PharmD and PCP to address needs related to optimized medication management of chronic conditions  Interventions: . Comprehensive medication review performed. . Advised patient to ensure she is eating 3 diabetic friendly meals daily, despite many MD appts in order to avoid hypoglycemia.   She will continue taking medication as prescribed.  Patient on twice daily Levemir with sliding scale and still having BGs range from 100-upper 200s.  Will discuss switching to a basal once daily insulin Tyler Aas covered on insurance) to avoid hypoglycemia during the day and provide steady daily coverage.  She reports 3 episodes of hypoglycemia over the past 3 months that are likely due to patient having multiple doctor appts in one day and not eating on a normal schedule. [name] Collaboration with provider re: medication management  Patient Self Care Activities: with help from granddaughter . Self administers medications as prescribed . Attends all scheduled provider appointments . Calls pharmacy for medication refills  Initial goal documentation      The patient verbalized understanding of instructions provided today and declined a print copy of patient instruction materials.   The care management team will reach out to the patient again over the next 14 days.    Regina Eck, PharmD, BCPS Clinical Pharmacist, [facility] [place]: [phone]

## 2019-03-26 NOTE — Progress Notes (Signed)
Chronic Care Management   Visit Note  03/20/2019 Name: [name] MRN: [id number] DOB: [birth year]  Referred by: [name], MD Reason for referral : Chronic Care Management   [name] is a 85 y.o. year old female who is a primary care patient of [name], MD. The CCM team was consulted for assistance with chronic disease management and care coordination needs.   Review of patient status, including review of consultants reports, relevant laboratory and other test results, and collaboration with appropriate care team members and the patient's provider was performed as part of comprehensive patient evaluation and provision of chronic care management services.    I spoke with Ms. Buell and her granddaughter by telephone today.  Objective:   Goals Addressed            This Visit's Progress     Patient Stated   . My medications are too expensive (pt-stated)       Current Barriers:  . Financial Barriers  Pharmacist Clinical Goal(s):  [name] Over the next 30 days, patient will work with CCM team/PharmD to address needs related to medications (financial)  Interventions: . Comprehensive medication review performed. [name] Collaboration with health plan regarding medication benefit . Collaboration with RN Care Manager re: medications/DM  . Call placed to [facility] pharmacy-->discovered patient has FULL Extra Help/LIS meaning that her copays are $8.90 (brand meds, 78-month supply) and $3.40 (generic meds, 31-month supply).  Unfortunately, she does not qualify for manufacturers programs due to FULL LIS/extra help.   . Will request new RX written for Lantus pens & Humalog pens qty: 50mL (8 insulin pens=90 day supply).  New RXs not needed until June 2020 since 90-day supply filled April 2020-->will follow . Will collaborate with CCM BSW regarding Medicaid and other resources. . Will continue to explore cost saving options . Will ensure all RXs are written for 18-month supplies.   Patient Self Care Activities:  . Self administers medications as prescribed . Attends all scheduled provider appointments . Calls pharmacy for medication refills  Please see past updates related to this goal by clicking on the "Past Updates" button in the selected goal      . My sugars are running higher (pt-stated)       Current Barriers:  . Non Adherence to diabetic health diet  Pharmacist Clinical Goal(s):  [name] Over the next 30 days, patient will work with PharmD and PCP to address needs related to optimized medication management of chronic conditions  Interventions: . Comprehensive medication review performed. . Advised patient to ensure she is eating 3 diabetic friendly meals daily, despite many MD appts in order to avoid hypoglycemia.  She will continue taking medication as prescribed.  Patient on twice daily Levemir with sliding scale and still having BGs range from 100-upper 200s.  Will discuss switching to a basal once daily insulin Tyler Aas covered on insurance) to avoid hypoglycemia during the day and provide steady daily coverage.  She reports 3 episodes of hypoglycemia over the past 3 months that are likely due to patient having multiple doctor appts in one day and not eating on a normal schedule. [name] Collaboration with provider re: medication management  Patient Self Care Activities: with help from granddaughter . Self administers medications as prescribed . Attends all scheduled provider appointments . Calls pharmacy for medication refills  Initial goal documentation         Plan:   The care management team will reach out to the patient again over the  next 14 days.   Regina Eck, PharmD, BCPS Clinical Pharmacist, [facility] [place]: [phone]

## 2019-03-27 ENCOUNTER — Ambulatory Visit: Payer: Self-pay

## 2019-03-27 ENCOUNTER — Telehealth: Payer: Self-pay

## 2019-03-27 DIAGNOSIS — Z170 Estrogen receptor positive status [ER+]: Secondary | ICD-10-CM

## 2019-03-27 DIAGNOSIS — E1122 Type 2 diabetes mellitus with diabetic chronic kidney disease: Secondary | ICD-10-CM

## 2019-03-27 DIAGNOSIS — C50311 Malignant neoplasm of lower-inner quadrant of right female breast: Secondary | ICD-10-CM

## 2019-03-27 DIAGNOSIS — Z794 Long term (current) use of insulin: Secondary | ICD-10-CM

## 2019-03-27 DIAGNOSIS — I5032 Chronic diastolic (congestive) heart failure: Secondary | ICD-10-CM

## 2019-03-27 DIAGNOSIS — I130 Hypertensive heart and chronic kidney disease with heart failure and stage 1 through stage 4 chronic kidney disease, or unspecified chronic kidney disease: Secondary | ICD-10-CM

## 2019-03-27 NOTE — Chronic Care Management (AMB) (Signed)
  Chronic Care Management   Outreach Note  03/27/2019 Name: [name] MRN: [id number] DOB: 14-Nov-1932  Referred by: [name], MD Reason for referral : Chronic Care Management (CCM RN CM Telephone Follow Up )   An unsuccessful telephone outreach was attempted today. The patient was referred to the case management team by [name] MD for assistance with chronic care management and care coordination.    Follow Up Plan: The care management team will reach out to the patient again over the next 5-7 days.   [name], RN,CCM Care Management Coordinator [facility]  Direct Phone: [phone]

## 2019-03-28 ENCOUNTER — Telehealth: Payer: Self-pay | Admitting: Internal Medicine

## 2019-03-28 ENCOUNTER — Other Ambulatory Visit: Payer: Self-pay

## 2019-03-28 DIAGNOSIS — D519 Vitamin B12 deficiency anemia, unspecified: Secondary | ICD-10-CM | POA: Diagnosis not present

## 2019-03-28 DIAGNOSIS — D529 Folate deficiency anemia, unspecified: Secondary | ICD-10-CM | POA: Diagnosis not present

## 2019-03-28 DIAGNOSIS — D509 Iron deficiency anemia, unspecified: Secondary | ICD-10-CM | POA: Diagnosis not present

## 2019-03-28 MED ORDER — GLUCOSE BLOOD VI STRP: ORAL_STRIP | 3 refills | Status: DC

## 2019-03-28 NOTE — Telephone Encounter (Signed)
I called the patient to reschedule her AWV to an earlier date.  Her granddaughter, Patton Salles, said that she received the wrong strips for her granddaughter's machine, so they need another prescription sent.  She said that Auburn Bilberry can call her if she needs to verify which strips. VDM (DD)

## 2019-03-30 DIAGNOSIS — M1990 Unspecified osteoarthritis, unspecified site: Secondary | ICD-10-CM | POA: Diagnosis not present

## 2019-03-30 DIAGNOSIS — K449 Diaphragmatic hernia without obstruction or gangrene: Secondary | ICD-10-CM | POA: Diagnosis not present

## 2019-03-30 DIAGNOSIS — R569 Unspecified convulsions: Secondary | ICD-10-CM | POA: Diagnosis not present

## 2019-03-30 DIAGNOSIS — J449 Chronic obstructive pulmonary disease, unspecified: Secondary | ICD-10-CM | POA: Diagnosis not present

## 2019-03-30 DIAGNOSIS — E1122 Type 2 diabetes mellitus with diabetic chronic kidney disease: Secondary | ICD-10-CM | POA: Diagnosis not present

## 2019-03-30 DIAGNOSIS — Z880 Allergy status to penicillin: Secondary | ICD-10-CM | POA: Diagnosis not present

## 2019-03-30 DIAGNOSIS — I447 Left bundle-branch block, unspecified: Secondary | ICD-10-CM | POA: Diagnosis not present

## 2019-03-30 DIAGNOSIS — R601 Generalized edema: Secondary | ICD-10-CM | POA: Diagnosis not present

## 2019-03-30 DIAGNOSIS — I2510 Atherosclerotic heart disease of native coronary artery without angina pectoris: Secondary | ICD-10-CM | POA: Diagnosis not present

## 2019-03-30 DIAGNOSIS — N189 Chronic kidney disease, unspecified: Secondary | ICD-10-CM | POA: Diagnosis not present

## 2019-03-30 DIAGNOSIS — Z1159 Encounter for screening for other viral diseases: Secondary | ICD-10-CM | POA: Diagnosis not present

## 2019-03-30 DIAGNOSIS — R001 Bradycardia, unspecified: Secondary | ICD-10-CM | POA: Diagnosis not present

## 2019-03-30 DIAGNOSIS — R471 Dysarthria and anarthria: Secondary | ICD-10-CM | POA: Diagnosis not present

## 2019-03-30 DIAGNOSIS — Z794 Long term (current) use of insulin: Secondary | ICD-10-CM | POA: Diagnosis not present

## 2019-03-30 DIAGNOSIS — I480 Paroxysmal atrial fibrillation: Secondary | ICD-10-CM | POA: Diagnosis not present

## 2019-03-30 DIAGNOSIS — Z79811 Long term (current) use of aromatase inhibitors: Secondary | ICD-10-CM | POA: Diagnosis not present

## 2019-03-30 DIAGNOSIS — R1310 Dysphagia, unspecified: Secondary | ICD-10-CM | POA: Diagnosis not present

## 2019-03-30 DIAGNOSIS — Z743 Need for continuous supervision: Secondary | ICD-10-CM | POA: Diagnosis not present

## 2019-03-30 DIAGNOSIS — Z8673 Personal history of transient ischemic attack (TIA), and cerebral infarction without residual deficits: Secondary | ICD-10-CM | POA: Diagnosis not present

## 2019-03-30 DIAGNOSIS — I509 Heart failure, unspecified: Secondary | ICD-10-CM | POA: Diagnosis not present

## 2019-03-30 DIAGNOSIS — I420 Dilated cardiomyopathy: Secondary | ICD-10-CM | POA: Diagnosis not present

## 2019-03-30 DIAGNOSIS — I130 Hypertensive heart and chronic kidney disease with heart failure and stage 1 through stage 4 chronic kidney disease, or unspecified chronic kidney disease: Secondary | ICD-10-CM | POA: Diagnosis not present

## 2019-03-30 DIAGNOSIS — I69321 Dysphasia following cerebral infarction: Secondary | ICD-10-CM | POA: Diagnosis not present

## 2019-03-30 DIAGNOSIS — I69351 Hemiplegia and hemiparesis following cerebral infarction affecting right dominant side: Secondary | ICD-10-CM | POA: Diagnosis not present

## 2019-03-30 DIAGNOSIS — I4891 Unspecified atrial fibrillation: Secondary | ICD-10-CM | POA: Diagnosis not present

## 2019-03-30 DIAGNOSIS — Z7901 Long term (current) use of anticoagulants: Secondary | ICD-10-CM | POA: Diagnosis not present

## 2019-03-30 DIAGNOSIS — R55 Syncope and collapse: Secondary | ICD-10-CM | POA: Diagnosis not present

## 2019-03-30 DIAGNOSIS — Z955 Presence of coronary angioplasty implant and graft: Secondary | ICD-10-CM | POA: Diagnosis not present

## 2019-03-30 DIAGNOSIS — Z888 Allergy status to other drugs, medicaments and biological substances status: Secondary | ICD-10-CM | POA: Diagnosis not present

## 2019-03-31 DIAGNOSIS — R4781 Slurred speech: Secondary | ICD-10-CM | POA: Diagnosis not present

## 2019-03-31 DIAGNOSIS — I480 Paroxysmal atrial fibrillation: Secondary | ICD-10-CM | POA: Diagnosis not present

## 2019-03-31 DIAGNOSIS — R471 Dysarthria and anarthria: Secondary | ICD-10-CM | POA: Diagnosis not present

## 2019-03-31 DIAGNOSIS — R001 Bradycardia, unspecified: Secondary | ICD-10-CM | POA: Diagnosis not present

## 2019-03-31 DIAGNOSIS — I420 Dilated cardiomyopathy: Secondary | ICD-10-CM | POA: Diagnosis not present

## 2019-03-31 DIAGNOSIS — I447 Left bundle-branch block, unspecified: Secondary | ICD-10-CM | POA: Diagnosis not present

## 2019-03-31 DIAGNOSIS — R55 Syncope and collapse: Secondary | ICD-10-CM | POA: Diagnosis not present

## 2019-03-31 DIAGNOSIS — R1310 Dysphagia, unspecified: Secondary | ICD-10-CM | POA: Diagnosis not present

## 2019-04-01 DIAGNOSIS — C50811 Malignant neoplasm of overlapping sites of right female breast: Secondary | ICD-10-CM | POA: Diagnosis not present

## 2019-04-01 DIAGNOSIS — Z170 Estrogen receptor positive status [ER+]: Secondary | ICD-10-CM | POA: Diagnosis not present

## 2019-04-02 ENCOUNTER — Other Ambulatory Visit: Payer: Self-pay

## 2019-04-02 ENCOUNTER — Ambulatory Visit (INDEPENDENT_AMBULATORY_CARE_PROVIDER_SITE_OTHER): Payer: Medicare Other

## 2019-04-02 ENCOUNTER — Other Ambulatory Visit: Payer: Self-pay | Admitting: Nurse Practitioner

## 2019-04-02 VITALS — BP 142/70 | HR 53 | Ht 65.0 in | Wt 192.0 lb

## 2019-04-02 DIAGNOSIS — R928 Other abnormal and inconclusive findings on diagnostic imaging of breast: Secondary | ICD-10-CM | POA: Diagnosis not present

## 2019-04-02 DIAGNOSIS — Z0000 Encounter for general adult medical examination without abnormal findings: Secondary | ICD-10-CM

## 2019-04-02 MED ORDER — PREGABALIN 75 MG PO CAPS: 75.0000 mg | ORAL_CAPSULE | Freq: Every day | ORAL | 2 refills | Status: DC

## 2019-04-02 NOTE — Progress Notes (Signed)
Subjective:   [name] is a 85 y.o. female who presents for Medicare Annual (Subsequent) preventive examination.  This visit type was conducted due to national recommendations for restrictions regarding the COVID-19 Pandemic (e.g. social distancing). This format is felt to be most appropriate for this patient at this time. All issues noted in this document were discussed and addressed. No physical exam was performed (except for noted visual exam findings with Video Visits). The patient, [name], has given consent to perform this visit via video. Vital signs may be absent or patient reported.   Patient location:   At home   Nurse location:  [place] office   Review of Systems:  n/a Cardiac Risk Factors include: advanced age (>29men, >70 women);diabetes mellitus;hypertension;obesity (BMI >30kg/m2);sedentary lifestyle     Objective:     Vitals: BP (!) 142/70 Comment: per patient   Pulse (!) 53 Comment: per patient   Ht 5\' 5"  (1.651 m) Comment: per patient   Wt 192 lb (87.1 kg) Comment: per patient   BMI 31.95 kg/m   Body mass index is 31.95 kg/m.  Advanced Directives 04/02/2019 01/01/2019 11/28/2018 11/14/2018 11/06/2018 11/06/2018 08/13/2018  Does Patient Have a Medical Advance Directive? No No No No No No No  Would patient like information on creating a medical advance directive? - No - Patient declined No - Patient declined No - Patient declined No - Patient declined - No - Patient declined  Pre-existing out of facility DNR order (yellow form or pink MOST form) - - - - - - -    Tobacco Social History   Tobacco Use  Smoking Status Never Smoker  Smokeless Tobacco Never Used     Counseling given: Not Answered   Clinical Intake:  Pre-visit preparation completed: Yes  Pain : No/denies pain Pain Score: 0-No pain     Nutritional Status: BMI > 30  Obese Nutritional Risks: None Diabetes: Yes CBG done?: No Did pt. bring in CBG monitor from home?: No  How often do you  need to have someone help you when you read instructions, pamphlets, or other written materials from your doctor or pharmacy?: 1 - Never What is the last grade level you completed in school?: 12th grade  Interpreter Needed?: No  Information entered by :: NAllen LPN  Past Medical History:  Diagnosis Date   Anemia    Arthritis    Cancer ([place])    Cataract    Chronic diastolic CHF (congestive heart failure) (HCC)    CKD (chronic kidney disease), stage III ([place])    Clotting disorder ([place])    Diabetes mellitus ([place])    Dizziness and giddiness    DVT (deep venous thrombosis) ([place])    a. On Coumadin for this.   Essential hypertension    LBBB (left bundle branch block)    Mitral regurgitation    a. Mod MR by echo 2014.   Muscular deconditioning    Obesity    SVT (supraventricular tachycardia) ([place])    a. In 2013 she had an EPS with ablation for SVT which did not eliminate the SVT completely. She also had bradycardia which limited medication. She was placed on amiodarone by Dr. Lovena Le.   Thrombocytopenia ([place]) 11/21/2011   Past Surgical History:  Procedure Laterality Date   EYE SURGERY     SUPRAVENTRICULAR TACHYCARDIA ABLATION N/A 10/11/2012   Procedure: SUPRAVENTRICULAR TACHYCARDIA ABLATION;  Surgeon: Evans Lance, MD;  Location: [facility] CATH LAB;  Service: Cardiovascular;  Laterality: N/A;  Family History  Problem Relation Age of Onset   Stroke Mother    Cancer Father        prostate   Hypertension Daughter    Heart attack Brother        x2   Hypertension Brother    Stroke Sister    Hypertension Sister    Breast cancer Maternal Aunt    Social History   Socioeconomic History   Marital status: Widowed    Spouse name: Not on file   Number of children: Not on file   Years of education: Not on file   Highest education level: Not on file  Occupational History   Occupation: retired  Scientist, product/process development strain: Not hard at [employer] insecurity:    Worry: Never true    Inability: Never true   Transportation needs:    Medical: No    Non-medical: No  Tobacco Use   Smoking status: Never Smoker   Smokeless tobacco: Never Used  Substance and Sexual Activity   Alcohol use: No   Drug use: No   Sexual activity: Not Currently  Lifestyle   Physical activity:    Days per week: 0 days    Minutes per session: 0 min   Stress: Not at all  Relationships   Social connections:    Talks on phone: Not on file    Gets together: Not on file    Attends religious service: Not on file    Active member of club or organization: Not on file    Attends meetings of clubs or organizations: Not on file    Relationship status: Not on file  Other Topics Concern   Not on file  Social History Narrative   Lives at home alone   She has a nursing aid who comes to her home for 2.5 hrs/day x 4 days/week    Right handed   1 cup caffeine daily    Outpatient Encounter Medications as of 04/02/2019  Medication Sig   acetaminophen (TYLENOL) 500 MG tablet Take 1,000 mg by mouth every 6 (six) hours as needed.   amiodarone (PACERONE) 200 MG tablet Take 1 tablet (200 mg total) by mouth daily.   anastrozole (ARIMIDEX) 1 MG tablet Take 1 tablet (1 mg total) by mouth daily.   apixaban (ELIQUIS) 5 MG TABS tablet Take 1 tablet (5 mg total) by mouth 2 (two) times daily.   atorvastatin (LIPITOR) 20 MG tablet Take 1 tablet (20 mg total) by mouth at bedtime.   BD PEN NEEDLE NANO U/F 32G X 4 MM MISC Use as directed   bumetanide (BUMEX) 1 MG tablet Take 1 mg by mouth 2 (two) times daily.   calcium carbonate (OS-CAL) 600 MG TABS Take 600 mg by mouth daily with breakfast.    diclofenac sodium (VOLTAREN) 1 % GEL Apply 2 g topically 4 (four) times daily.   glucose blood (ACCU-CHEK GUIDE) test strip Use as instructed to check blood sugars 3 times per day prn dx: e11.22   HUMALOG KWIKPEN 200 UNIT/ML SOPN Inject 4-8 Units into the skin 3  (three) times daily. Per sliding scale   LEVEMIR FLEXTOUCH 100 UNIT/ML Pen Inject 10-16 Units into the skin daily. 16 units in the am and 10 units in the evening   magnesium oxide (MAG-OX) 400 MG tablet Take 400 mg by mouth daily.   omeprazole (PRILOSEC) 40 MG capsule TAKE ONE CAPSULE BY MOUTH DAILY BEFORE A MEAL  oxybutynin (DITROPAN) 5 MG tablet Take 1 tablet (5 mg total) by mouth 2 (two) times daily.   potassium chloride SA (K-DUR) 20 MEQ tablet Take 2 tablets (40 mEq total) by mouth daily.   pregabalin (LYRICA) 75 MG capsule Take 1 capsule (75 mg total) by mouth at bedtime.   traMADol (ULTRAM) 50 MG tablet Take 50 mg by mouth 2 (two) times daily as needed for pain.   carvedilol (COREG) 12.5 MG tablet Take 1 tablet (12.5 mg total) by mouth 2 (two) times daily. (Patient not taking: Reported on 04/02/2019)   isosorbide-hydrALAZINE (BIDIL) 20-37.5 MG tablet BiDil 20 mg-37.5 mg tablet  Take 1 tablet twice a day by oral route.   [DISCONTINUED] pregabalin (LYRICA) 75 MG capsule Take 75 mg by mouth at bedtime.   No facility-administered encounter medications on file as of 04/02/2019.     Activities of Daily Living In your present state of health, do you have any difficulty performing the following activities: 04/02/2019 01/02/2019  Hearing? Y N  Vision? Y N  Difficulty concentrating or making decisions? Y N  Comment lately, might be stress related -  Walking or climbing stairs? Y Y  Dressing or bathing? Y Y  Comment granddaughter helps -  Doing errands, shopping? Y Y  Comment granddaughter takes -  Conservation officer, nature and eating ? Y -  Comment meals on wheels -  Using the Toilet? Y -  Comment uses walker -  In the past six months, have you accidently leaked urine? N -  Do you have problems with loss of bowel control? N -  Managing your Medications? Y -  Comment granddaughter helps -  Managing your Finances? Y -  Comment granddaughter helps -  Housekeeping or managing your  Housekeeping? Y -  Comment granddaughter helps -  Some recent data might be hidden    Patient Care Team: [name], MD as PCP - General Jettie Booze, MD as PCP - Cardiology (Cardiology) Excell Seltzer, MD as Consulting Physician (General Surgery) Nicholas Lose, MD as Consulting Physician (Hematology and Oncology) Kyung Rudd, MD as Consulting Physician (Radiation Oncology) [name] as Social Worker Little, Claudette Stapler, RN as Case Manager Lavera Guise, [facility] (Pharmacist)    Assessment:   This is a routine wellness examination for Deziree.  Exercise Activities and Dietary recommendations Current Exercise Habits: The patient does not participate in regular exercise at present  Goals     "I check my sugars three times a day" (pt-stated)     Current Barriers:   Knowledge Deficits related to disease process and Self Health management for Diabetes  Nurse Case Manager Clinical Goal(s):   Over the next 30 days, patient will verbalize basic understanding of Diabetes disease process and self health management plan as evidenced by patient will verbalize how to meal plan and follow a low carb Diabetic diet, monitor CBG's, take insulin exactly as prescribed and verbalize knowing when to call the doctor.   CCM RN CM Interventions:  Completed on 03/19/19: completed with patient and granddaughter [name]   Reviewed patient's BG log; discussed patient continues to have BG < 70 and > 250  Reviewed patient's prescribed insulin regimen  Confirmed patient received printed DM materials related to Meal Planning and using the Plate Method (pt is using a sectioned plate)  Discussed ongoing CCM follow up   Sent message to embedded Pharm D Lottie Dawson with patient update  Scheduled a follow up call with patient and granddaughter [name] for 1-2  weeks  Patient Self Care Activities:   Verbalizes understanding of the education/information provided today   Self administers medications  as prescribed  Attends all scheduled provider appointments  Calls pharmacy for medication refills  Performs ADL's independently  Performs IADL's independently  Calls provider office for new concerns or questions  Please see past updates related to this goal by clicking on the "Past Updates" button in the selected goal        "I want a second opinion about my cancer" (pt-stated)     Current Barriers:  Knowledge Deficits related to treatment management for 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   Over the next 30 days, patient will attend all scheduled medical appointments: including follow up at [facility] for 2nd opinion of breast neoplasm scheduled for 03/04/19.   CCM RN CM Interventions:  Completed on 03/19/19: completed call with patient and granddaughter [name]   Evaluation of current treatment plan related to diagnosis and treatment for right breast neoplasm and patient's adherence to plan as established by provider  Reviewed scheduled/upcoming provider appointments including ongoing Oncology follow up at [facility] for understanding of the prescribed treatment plan as recommended by Oncologist  Discussed plans with patient for ongoing care management follow up and provided patient with direct contact information for care management team  Confirmed patient/granddaughter RN CM contact # and discussed hours of availability  Scheduled a CCM telephone follow up call with patient/granddaughter for about 1-2 weeks  Patient Self Care Activities:   Self administers medications as prescribed  Attends all scheduled provider appointments  Calls pharmacy for medication refills  Performs ADL's independently  Performs IADL's independently  Calls provider  office for new concerns or questions  Please see past updates related to this goal by clicking on the "Past Updates" button in the selected goal        I think my sugar has been higher recently (pt-stated)     Current Barriers:   Non Adherence to prescribed diabetes diet/regimen (most recent A1c 8.4)  Pharmacist Clinical Goal(s):   Over the next 60 days, patient will demonstrate Improved medication/diet adherence as evidenced by decreased A1c/improved blood sugar readings.  Interventions:  Comprehensive medication review performed.  Advised patient to eat DM healthy diet, continue taking medications as prescribed.   CCM RN CM Interventions:  Completed on 03/19/19: completed with patient and granddaughter [name]   Reviewed patient's BG log; discussed patient continues to have BG < 70 and > 250  Reviewed patient's prescribed insulin regimen  Confirmed patient received printed DM materials related to Meal Planning and using the Plate Method (pt is using a sectioned plate)  Discussed ongoing CCM follow up   Sent message to embedded Pharm D Lottie Dawson with patient update  Scheduled a follow up call with patient and granddaughter [name] for 1-2 weeks  Patient Self Care Activities: with granddaughter assistance  Self administers medications as prescribed  Attends all scheduled provider appointments  Calls pharmacy for medication refills  Please see past updates related to this goal by clicking on the "Past Updates" button in the selected goal       My medications are too expensive (pt-stated)     Current Barriers:   Financial Barriers  Pharmacist Clinical Goal(s):   Over the next 30 days, patient  will work with CCM team/PharmD to address needs related to medications (financial)  Interventions:  Comprehensive medication review performed.  Collaboration with health plan regarding medication benefit  Collaboration with RN Care Manager re: medications/DM   Call  placed to [facility] pharmacy-->discovered patient has FULL Extra Help/LIS meaning that her copays are $8.90 (brand meds, 73-month supply) and $3.40 (generic meds, 73-month supply).  Unfortunately, she does not qualify for manufacturers programs due to FULL LIS/extra help.    Will request new RX written for Lantus pens & Humalog pens qty: 47mL (8 insulin pens=90 day supply).  New RXs not needed until June 2020 since 90-day supply filled April 2020-->will follow  Will collaborate with CCM BSW regarding Medicaid and other resources.  Will continue to explore cost saving options  Will ensure all RXs are written for 30-month supplies.  Patient Self Care Activities:   Self administers medications as prescribed  Attends all scheduled provider appointments  Calls pharmacy for medication refills  Please see past updates related to this goal by clicking on the "Past Updates" button in the selected goal       My sugars are running higher (pt-stated)     Current Barriers:   Non Adherence to diabetic health diet  Pharmacist Clinical Goal(s):   Over the next 30 days, patient will work with PharmD and PCP to address needs related to optimized medication management of chronic conditions  Interventions:  Comprehensive medication review performed.  Advised patient to ensure she is eating 3 diabetic friendly meals daily, despite many MD appts in order to avoid hypoglycemia.  She will continue taking medication as prescribed.  Patient on twice daily Levemir with sliding scale and still having BGs range from 100-upper 200s.  Will discuss switching to [place] (covered on insurance) to avoid hypoglycemia during the day and steady daily coverage.  She reports 3 episodes of hypoglycemia over the past 3 months that are likely due to patient having multiple doctor appts in one day and not eating on a normal schedule.  Collaboration with provider re: medication management  Patient Self Care Activities: with  help from granddaughter  Self administers medications as prescribed  Attends all scheduled provider appointments  Calls pharmacy for medication refills  Initial goal documentation      Patient Stated     Wants to get better      High Risk for Knowledge Deficit for CHF      Current Barriers:   Knowledge Deficits related to disease process and Self Health management for CHF   Nurse Case Manager Clinical Goal(s):   Over the next 30 days, patient will verbalize basic understanding of CHF disease process and self health management plan as evidenced by patient and caregiver will verbalize better understanding of the disease process for CHF and will verbalize increased understanding of MD recommendations for CHF disease management.   CCM RN CM Interventions:  Completed on 03/19/19: completed with patient and granddaughter [name]   Evaluation of current treatment plan related to CHF and bradycardia and patient's adherence to plan as established by provider (patient ordered by Cardiologist to wear 24 hr Holter monitor; granddaughter is monitoring BP/HR, reports HR has improved with average HR running in 60's)  Reinforced education to patient/granddaughter [name] re: disease process and treatment management related to CHF (daily weights, reporting wt gain of 3 lbs in 1 day or 5 lbs in 1 week; increased edema in lower extremities, abdomen, hands, nausea, shortness of breath) (pt has not purchased a weight  scale but plans to next week when she gets paid)  Reviewed medications with patient and discussed medication changes recommended by Dr. Duncan Dull and Dr. Elesa Massed  Reinforced importance of adhering to a strict low Sodium diet and provided rationale  Discussed plans with patient for ongoing care management follow up and provided patient with direct contact information for care management team  Confirmed patient received printed educational materials related to CHF  Reviewed scheduled/upcoming  provider appointments including ongoing follow up with [name], Cardiology, PCP and [name] (home PCP)  Scheduled a CCM RN follow up call with patient/granddaughter for about 1-2 weeks    Patient Self Care Activities:   Attends all scheduled provider appointments  Calls pharmacy for medication refills  Calls provider office for new concerns or questions  Please see past updates related to this goal by clicking on the "Past Updates" button in the selected goal          Fall Risk Fall Risk  04/02/2019 03/21/2019 02/04/2019 12/04/2018 11/14/2018  Falls in the past year? 0 0 0 0 1  Comment - - - - -  Number falls in past yr: - - - - 1  Injury with Fall? - - - - 1  Risk for fall due to : Impaired vision;Medication side effect;Impaired mobility;Impaired balance/gait - - - History of fall(s);Impaired balance/gait;Impaired mobility  Follow up Falls prevention discussed - - - Education provided;Falls prevention discussed   Is the patient's home free of loose throw rugs in walkways, pet beds, electrical cords, etc?   yes      Grab bars in the bathroom? no      Handrails on the stairs?   yes      Adequate lighting?   yes  Timed Get Up and Go performed: n/a  Depression Screen PHQ 2/9 Scores 04/02/2019 03/21/2019 02/04/2019 12/04/2018  PHQ - 2 Score 0 2 0 0  PHQ- 9 Score 1 3 - -     Cognitive Function     6CIT Screen 04/02/2019  What Year? 0 points  What month? 0 points  What time? 0 points  Count back from 20 0 points  Months in reverse 0 points  Repeat phrase 0 points  Total Score 0    Immunization History  Administered Date(s) Administered   DTaP 12/13/2012   Influenza, High Dose Seasonal PF 08/15/2018   Pneumococcal-Unspecified 04/04/2016    Qualifies for Shingles Vaccine? yes  Screening Tests Health Maintenance  Topic Date Due   FOOT EXAM  02/10/1943   OPHTHALMOLOGY EXAM  [birth year]10/1943   URINE MICROALBUMIN  [birth year]10/1943   DEXA SCAN  02/11/2000   PNA vac  Low Risk Adult (2 of 2 - PCV13) 04/04/2017   INFLUENZA VACCINE  05/25/2019   HEMOGLOBIN A1C  07/05/2019   TETANUS/TDAP  06/13/2023    Cancer Screenings: Lung: Low Dose CT Chest recommended if Age 45-80 years, 30 pack-year currently smoking OR have quit w/in 15years. Patient does not qualify. Breast:  Up to date on Mammogram? Yes   Up to date of Bone Density/Dexa? Yes Colorectal: not required  Additional Screenings: : Hepatitis C Screening: n/a     Plan:   Wants to get better  I have personally reviewed and noted the following in the patients chart:    Medical and social history  Use of alcohol, tobacco or illicit drugs   Current medications and supplements  Functional ability and status  Nutritional status  Physical activity  Advanced directives  List of  other physicians  Hospitalizations, surgeries, and ER visits in previous 12 months  Vitals  Screenings to include cognitive, depression, and falls  Referrals and appointments  In addition, I have reviewed and discussed with patient certain preventive protocols, quality metrics, and best practice recommendations. A written personalized care plan for preventive services as well as general preventive health recommendations were provided to patient.     Kellie Simmering, LPN  06/30/5299

## 2019-04-02 NOTE — Patient Instructions (Signed)
[name] , Thank you for taking time to come for your Medicare Wellness Visit. I appreciate your ongoing commitment to your health goals. Please review the following plan we discussed and let me know if I can assist you in the future.   Screening recommendations/referrals: Colonoscopy: not required Mammogram: 10/2018 Bone Density: couple years ago per patient Recommended yearly ophthalmology/optometry visit for glaucoma screening and checkup Recommended yearly dental visit for hygiene and checkup  Vaccinations: Influenza vaccine: 07/2018 Pneumococcal vaccine: 03/2016 Tdap vaccine: 05/2013 Shingles vaccine: discussed    Advanced directives: Advance directive discussed with you today.   Conditions/risks identified: obesity  Next appointment: 05/08/2019 at 3:30   Preventive Care 42 Years and Older, Female Preventive care refers to lifestyle choices and visits with your health care provider that can promote health and wellness. What does preventive care include?  A yearly physical exam. This is also called an annual well check.  Dental exams once or twice a year.  Routine eye exams. Ask your health care provider how often you should have your eyes checked.  Personal lifestyle choices, including:  Daily care of your teeth and gums.  Regular physical activity.  Eating a healthy diet.  Avoiding tobacco and drug use.  Limiting alcohol use.  Practicing safe sex.  Taking low-dose aspirin every day.  Taking vitamin and mineral supplements as recommended by your health care provider. What happens during an annual well check? The services and screenings done by your health care provider during your annual well check will depend on your age, overall health, lifestyle risk factors, and family history of disease. Counseling  Your health care provider may ask you questions about your:  Alcohol use.  Tobacco use.  Drug use.  Emotional well-being.  Home and relationship  well-being.  Sexual activity.  Eating habits.  History of falls.  Memory and ability to understand (cognition).  Work and work Statistician.  Reproductive health. Screening  You may have the following tests or measurements:  Height, weight, and BMI.  Blood pressure.  Lipid and cholesterol levels. These may be checked every 5 years, or more frequently if you are over 60 years old.  Skin check.  Lung cancer screening. You may have this screening every year starting at age 80 if you have a 30-pack-year history of smoking and currently smoke or have quit within the past 15 years.  Fecal occult blood test (FOBT) of the stool. You may have this test every year starting at age 52.  Flexible sigmoidoscopy or colonoscopy. You may have a sigmoidoscopy every 5 years or a colonoscopy every 10 years starting at age 54.  Hepatitis C blood test.  Hepatitis B blood test.  Sexually transmitted disease (STD) testing.  Diabetes screening. This is done by checking your blood sugar (glucose) after you have not eaten for a while (fasting). You may have this done every 1-3 years.  Bone density scan. This is done to screen for osteoporosis. You may have this done starting at age 65.  Mammogram. This may be done every 1-2 years. Talk to your health care provider about how often you should have regular mammograms. Talk with your health care provider about your test results, treatment options, and if necessary, the need for more tests. Vaccines  Your health care provider may recommend certain vaccines, such as:  Influenza vaccine. This is recommended every year.  Tetanus, diphtheria, and acellular pertussis (Tdap, Td) vaccine. You may need a Td booster every 10 years.  Zoster vaccine. You  may need this after age 64.  Pneumococcal 13-valent conjugate (PCV13) vaccine. One dose is recommended after age 64.  Pneumococcal polysaccharide (PPSV23) vaccine. One dose is recommended after age 54.  Talk to your health care provider about which screenings and vaccines you need and how often you need them. This information is not intended to replace advice given to you by your health care provider. Make sure you discuss any questions you have with your health care provider. Document Released: 11/06/2015 Document Revised: 06/29/2016 Document Reviewed: 08/11/2015 Elsevier Interactive Patient Education  2017 [place] Prevention in the Home Falls can cause injuries. They can happen to people of all ages. There are many things you can do to make your home safe and to help prevent falls. What can I do on the outside of my home?  Regularly fix the edges of walkways and driveways and fix any cracks.  Remove anything that might make you trip as you walk through a door, such as a raised step or threshold.  Trim any bushes or trees on the path to your home.  Use bright outdoor lighting.  Clear any walking paths of anything that might make someone trip, such as rocks or tools.  Regularly check to see if handrails are loose or broken. Make sure that both sides of any steps have handrails.  Any raised decks and porches should have guardrails on the edges.  Have any leaves, snow, or ice cleared regularly.  Use sand or salt on walking paths during winter.  Clean up any spills in your garage right away. This includes oil or grease spills. What can I do in the bathroom?  Use night lights.  Install grab bars by the toilet and in the tub and shower. Do not use towel bars as grab bars.  Use non-skid mats or decals in the tub or shower.  If you need to sit down in the shower, use a plastic, non-slip stool.  Keep the floor dry. Clean up any water that spills on the floor as soon as it happens.  Remove soap buildup in the tub or shower regularly.  Attach bath mats securely with double-sided non-slip rug tape.  Do not have throw rugs and other things on the floor that can make you  trip. What can I do in the bedroom?  Use night lights.  Make sure that you have a light by your bed that is easy to reach.  Do not use any sheets or blankets that are too big for your bed. They should not hang down onto the floor.  Have a firm chair that has side arms. You can use this for support while you get dressed.  Do not have throw rugs and other things on the floor that can make you trip. What can I do in the kitchen?  Clean up any spills right away.  Avoid walking on wet floors.  Keep items that you use a lot in easy-to-reach places.  If you need to reach something above you, use a strong step stool that has a grab bar.  Keep electrical cords out of the way.  Do not use floor polish or wax that makes floors slippery. If you must use wax, use non-skid floor wax.  Do not have throw rugs and other things on the floor that can make you trip. What can I do with my stairs?  Do not leave any items on the stairs.  Make sure that there are handrails on both  sides of the stairs and use them. Fix handrails that are broken or loose. Make sure that handrails are as long as the stairways.  Check any carpeting to make sure that it is firmly attached to the stairs. Fix any carpet that is loose or worn.  Avoid having throw rugs at the top or bottom of the stairs. If you do have throw rugs, attach them to the floor with carpet tape.  Make sure that you have a light switch at the top of the stairs and the bottom of the stairs. If you do not have them, ask someone to add them for you. What else can I do to help prevent falls?  Wear shoes that:  Do not have high heels.  Have rubber bottoms.  Are comfortable and fit you well.  Are closed at the toe. Do not wear sandals.  If you use a stepladder:  Make sure that it is fully opened. Do not climb a closed stepladder.  Make sure that both sides of the stepladder are locked into place.  Ask someone to hold it for you, if  possible.  Clearly mark and make sure that you can see:  Any grab bars or handrails.  First and last steps.  Where the edge of each step is.  Use tools that help you move around (mobility aids) if they are needed. These include:  Canes.  Walkers.  Scooters.  Crutches.  Turn on the lights when you go into a dark area. Replace any light bulbs as soon as they burn out.  Set up your furniture so you have a clear path. Avoid moving your furniture around.  If any of your floors are uneven, fix them.  If there are any pets around you, be aware of where they are.  Review your medicines with your doctor. Some medicines can make you feel dizzy. This can increase your chance of falling. Ask your doctor what other things that you can do to help prevent falls. This information is not intended to replace advice given to you by your health care provider. Make sure you discuss any questions you have with your health care provider. Document Released: 08/06/2009 Document Revised: 03/17/2016 Document Reviewed: 11/14/2014 Elsevier Interactive Patient Education  2017 [employer].

## 2019-04-03 DIAGNOSIS — D529 Folate deficiency anemia, unspecified: Secondary | ICD-10-CM | POA: Diagnosis not present

## 2019-04-03 DIAGNOSIS — E782 Mixed hyperlipidemia: Secondary | ICD-10-CM | POA: Diagnosis not present

## 2019-04-03 DIAGNOSIS — D519 Vitamin B12 deficiency anemia, unspecified: Secondary | ICD-10-CM | POA: Diagnosis not present

## 2019-04-03 DIAGNOSIS — Z7901 Long term (current) use of anticoagulants: Secondary | ICD-10-CM | POA: Diagnosis not present

## 2019-04-03 DIAGNOSIS — R55 Syncope and collapse: Secondary | ICD-10-CM | POA: Diagnosis not present

## 2019-04-03 DIAGNOSIS — I10 Essential (primary) hypertension: Secondary | ICD-10-CM | POA: Diagnosis not present

## 2019-04-03 DIAGNOSIS — N183 Chronic kidney disease, stage 3 (moderate): Secondary | ICD-10-CM | POA: Diagnosis not present

## 2019-04-04 ENCOUNTER — Telehealth: Payer: Self-pay

## 2019-04-04 ENCOUNTER — Other Ambulatory Visit: Payer: Self-pay

## 2019-04-04 ENCOUNTER — Ambulatory Visit (INDEPENDENT_AMBULATORY_CARE_PROVIDER_SITE_OTHER): Payer: Medicare Other

## 2019-04-04 DIAGNOSIS — N184 Chronic kidney disease, stage 4 (severe): Secondary | ICD-10-CM

## 2019-04-04 DIAGNOSIS — Z170 Estrogen receptor positive status [ER+]: Secondary | ICD-10-CM

## 2019-04-04 DIAGNOSIS — I4891 Unspecified atrial fibrillation: Secondary | ICD-10-CM | POA: Diagnosis not present

## 2019-04-04 DIAGNOSIS — C50911 Malignant neoplasm of unspecified site of right female breast: Secondary | ICD-10-CM | POA: Diagnosis not present

## 2019-04-04 DIAGNOSIS — D630 Anemia in neoplastic disease: Secondary | ICD-10-CM | POA: Diagnosis not present

## 2019-04-04 DIAGNOSIS — I5032 Chronic diastolic (congestive) heart failure: Secondary | ICD-10-CM

## 2019-04-04 DIAGNOSIS — Z794 Long term (current) use of insulin: Secondary | ICD-10-CM

## 2019-04-04 DIAGNOSIS — E1122 Type 2 diabetes mellitus with diabetic chronic kidney disease: Secondary | ICD-10-CM

## 2019-04-04 DIAGNOSIS — I10 Essential (primary) hypertension: Secondary | ICD-10-CM | POA: Diagnosis not present

## 2019-04-04 DIAGNOSIS — C50311 Malignant neoplasm of lower-inner quadrant of right female breast: Secondary | ICD-10-CM | POA: Diagnosis not present

## 2019-04-04 DIAGNOSIS — I130 Hypertensive heart and chronic kidney disease with heart failure and stage 1 through stage 4 chronic kidney disease, or unspecified chronic kidney disease: Secondary | ICD-10-CM | POA: Diagnosis not present

## 2019-04-04 DIAGNOSIS — E1040 Type 1 diabetes mellitus with diabetic neuropathy, unspecified: Secondary | ICD-10-CM | POA: Diagnosis not present

## 2019-04-04 NOTE — Chronic Care Management (AMB) (Signed)
  Chronic Care Management   Follow Up Note   04/04/2019 Name: [name] MRN: [id number] DOB: 25-Mar-1933  Referred by: [name], MD Reason for referral : Chronic Care Management (CCM RNCM Telephone Follow Up )   [name] is a 85 y.o. year old female who is a primary care patient of [name], MD. The CCM team was consulted for assistance with chronic disease management and care coordination needs.    Review of patient status, including review of consultants reports, relevant laboratory and other test results, and collaboration with appropriate care team members and the patient's provider was performed as part of comprehensive patient evaluation and provision of chronic care management services.    I received an email from [name]'s granddaughter [name] with a patient status update.    Goals Addressed    . Marland KitchenHigh Risk for Knowledge Deficit for CHF        Current Barriers:  [name] Knowledge Deficits related to disease process and Self Health management for CHF   Nurse Case Manager Clinical Goal(s):  [name] Over the next 60 days, patient will verbalize basic understanding of CHF disease process and self health management plan as evidenced by patient and caregiver will verbalize better understanding of the disease process for CHF and will verbalize increased understanding of MD recommendations for CHF disease management. 04/04/19 re-established goal date to 60 days due to treatment delays secondary to Corona virus  CCM RN CM Interventions:  04/04/19: Received incoming email from granddaughter [name]:   Kermit Balo Afternoon Glenard Haring,   Thanks for reaching out to [place] last week.  I wanted to follow up with you to let you know that [name] had to be rushed to the ED on this past Saturday and they kept her overnight for observation due to issues with her heart.  I am attaching that report and other medical records of the other medical visits she has had.  The cardiologist is making  changes to her medication and wants her to consider a pacemaker. Of course, she is not happy about that, however her pulse stays low and has some delays in her heartbeat.    Please let me know if a follow up televisit is necessary with [name]?   Thanks,  [employer]   . In-basket message forwarded to [name]  . Planned call to f/u with patient and granddaughter on 04/05/19  Patient Self Care Activities:  . Attends all scheduled provider appointments . Calls pharmacy for medication refills . Calls provider office for new concerns or questions  Please see past updates related to this goal by clicking on the "Past Updates" button in the selected goal         Telephone follow up appointment with care management team member scheduled for: 04/05/19   [name], [facility] Care Management Coordinator [place] [facility]  Direct Phone: [phone]

## 2019-04-04 NOTE — Telephone Encounter (Signed)
  Left a message for the pt to call back.   Stephanie Chard, MD  Michelle Nasuti, CMA        NO. We do not need to meet after every office visit. She should have upcoming appt scheduled already. We can discuss any new events at that time.   RS

## 2019-04-05 ENCOUNTER — Telehealth: Payer: Self-pay

## 2019-04-05 ENCOUNTER — Ambulatory Visit: Payer: Self-pay

## 2019-04-05 DIAGNOSIS — C50911 Malignant neoplasm of unspecified site of right female breast: Secondary | ICD-10-CM | POA: Diagnosis not present

## 2019-04-05 DIAGNOSIS — D630 Anemia in neoplastic disease: Secondary | ICD-10-CM | POA: Diagnosis not present

## 2019-04-05 DIAGNOSIS — E1040 Type 1 diabetes mellitus with diabetic neuropathy, unspecified: Secondary | ICD-10-CM | POA: Diagnosis not present

## 2019-04-05 DIAGNOSIS — E1122 Type 2 diabetes mellitus with diabetic chronic kidney disease: Secondary | ICD-10-CM

## 2019-04-05 DIAGNOSIS — Z794 Long term (current) use of insulin: Secondary | ICD-10-CM

## 2019-04-05 DIAGNOSIS — I4891 Unspecified atrial fibrillation: Secondary | ICD-10-CM | POA: Diagnosis not present

## 2019-04-05 DIAGNOSIS — Z170 Estrogen receptor positive status [ER+]: Secondary | ICD-10-CM

## 2019-04-05 DIAGNOSIS — I130 Hypertensive heart and chronic kidney disease with heart failure and stage 1 through stage 4 chronic kidney disease, or unspecified chronic kidney disease: Secondary | ICD-10-CM

## 2019-04-05 DIAGNOSIS — C50311 Malignant neoplasm of lower-inner quadrant of right female breast: Secondary | ICD-10-CM

## 2019-04-05 DIAGNOSIS — I10 Essential (primary) hypertension: Secondary | ICD-10-CM | POA: Diagnosis not present

## 2019-04-05 DIAGNOSIS — I5032 Chronic diastolic (congestive) heart failure: Secondary | ICD-10-CM

## 2019-04-05 NOTE — Chronic Care Management (AMB) (Signed)
  Chronic Care Management   Outreach Note  04/05/2019 Name: [name] MRN: [id number] DOB: Aug 01, 1933  Referred by: [name], MD Reason for referral : Chronic Care Management (CCM CMRN Telephone Follow Up )   An unsuccessful telephone outreach was attempted today. The patient was referred to the case management team by [name] MD for assistance with chronic care management and care coordination.   Follow Up Plan: The care management team will reach out to the patient again over the next 7-10 days.   [name], RN,CCM Care Management Coordinator [place] [facility]  Direct Phone: [phone]

## 2019-04-08 DIAGNOSIS — E1040 Type 1 diabetes mellitus with diabetic neuropathy, unspecified: Secondary | ICD-10-CM | POA: Diagnosis not present

## 2019-04-08 DIAGNOSIS — C50911 Malignant neoplasm of unspecified site of right female breast: Secondary | ICD-10-CM | POA: Diagnosis not present

## 2019-04-08 DIAGNOSIS — D630 Anemia in neoplastic disease: Secondary | ICD-10-CM | POA: Diagnosis not present

## 2019-04-08 DIAGNOSIS — I10 Essential (primary) hypertension: Secondary | ICD-10-CM | POA: Diagnosis not present

## 2019-04-08 DIAGNOSIS — I4891 Unspecified atrial fibrillation: Secondary | ICD-10-CM | POA: Diagnosis not present

## 2019-04-10 DIAGNOSIS — I110 Hypertensive heart disease with heart failure: Secondary | ICD-10-CM | POA: Diagnosis not present

## 2019-04-11 DIAGNOSIS — E1040 Type 1 diabetes mellitus with diabetic neuropathy, unspecified: Secondary | ICD-10-CM | POA: Diagnosis not present

## 2019-04-11 DIAGNOSIS — I4891 Unspecified atrial fibrillation: Secondary | ICD-10-CM | POA: Diagnosis not present

## 2019-04-11 DIAGNOSIS — C50911 Malignant neoplasm of unspecified site of right female breast: Secondary | ICD-10-CM | POA: Diagnosis not present

## 2019-04-11 DIAGNOSIS — I10 Essential (primary) hypertension: Secondary | ICD-10-CM | POA: Diagnosis not present

## 2019-04-11 DIAGNOSIS — D630 Anemia in neoplastic disease: Secondary | ICD-10-CM | POA: Diagnosis not present

## 2019-04-15 DIAGNOSIS — I441 Atrioventricular block, second degree: Secondary | ICD-10-CM | POA: Diagnosis not present

## 2019-04-15 DIAGNOSIS — K719 Toxic liver disease, unspecified: Secondary | ICD-10-CM | POA: Diagnosis not present

## 2019-04-15 DIAGNOSIS — I429 Cardiomyopathy, unspecified: Secondary | ICD-10-CM | POA: Diagnosis not present

## 2019-04-15 DIAGNOSIS — N189 Chronic kidney disease, unspecified: Secondary | ICD-10-CM | POA: Diagnosis not present

## 2019-04-15 DIAGNOSIS — I10 Essential (primary) hypertension: Secondary | ICD-10-CM | POA: Diagnosis not present

## 2019-04-15 DIAGNOSIS — I130 Hypertensive heart and chronic kidney disease with heart failure and stage 1 through stage 4 chronic kidney disease, or unspecified chronic kidney disease: Secondary | ICD-10-CM | POA: Diagnosis not present

## 2019-04-15 DIAGNOSIS — E860 Dehydration: Secondary | ICD-10-CM | POA: Diagnosis not present

## 2019-04-15 DIAGNOSIS — I499 Cardiac arrhythmia, unspecified: Secondary | ICD-10-CM | POA: Diagnosis not present

## 2019-04-15 DIAGNOSIS — I69391 Dysphagia following cerebral infarction: Secondary | ICD-10-CM | POA: Diagnosis not present

## 2019-04-15 DIAGNOSIS — R001 Bradycardia, unspecified: Secondary | ICD-10-CM | POA: Diagnosis not present

## 2019-04-15 DIAGNOSIS — R1030 Lower abdominal pain, unspecified: Secondary | ICD-10-CM | POA: Diagnosis not present

## 2019-04-15 DIAGNOSIS — K2970 Gastritis, unspecified, without bleeding: Secondary | ICD-10-CM | POA: Diagnosis not present

## 2019-04-15 DIAGNOSIS — T462X5A Adverse effect of other antidysrhythmic drugs, initial encounter: Secondary | ICD-10-CM | POA: Diagnosis not present

## 2019-04-15 DIAGNOSIS — I272 Pulmonary hypertension, unspecified: Secondary | ICD-10-CM | POA: Diagnosis not present

## 2019-04-15 DIAGNOSIS — R112 Nausea with vomiting, unspecified: Secondary | ICD-10-CM | POA: Diagnosis not present

## 2019-04-15 DIAGNOSIS — I2510 Atherosclerotic heart disease of native coronary artery without angina pectoris: Secondary | ICD-10-CM | POA: Diagnosis not present

## 2019-04-15 DIAGNOSIS — M1990 Unspecified osteoarthritis, unspecified site: Secondary | ICD-10-CM | POA: Diagnosis not present

## 2019-04-15 DIAGNOSIS — I447 Left bundle-branch block, unspecified: Secondary | ICD-10-CM | POA: Diagnosis not present

## 2019-04-15 DIAGNOSIS — R197 Diarrhea, unspecified: Secondary | ICD-10-CM | POA: Diagnosis not present

## 2019-04-15 DIAGNOSIS — I69321 Dysphasia following cerebral infarction: Secondary | ICD-10-CM | POA: Diagnosis not present

## 2019-04-15 DIAGNOSIS — E119 Type 2 diabetes mellitus without complications: Secondary | ICD-10-CM | POA: Diagnosis not present

## 2019-04-15 DIAGNOSIS — I509 Heart failure, unspecified: Secondary | ICD-10-CM | POA: Diagnosis not present

## 2019-04-15 DIAGNOSIS — I4891 Unspecified atrial fibrillation: Secondary | ICD-10-CM | POA: Diagnosis not present

## 2019-04-15 DIAGNOSIS — Z1159 Encounter for screening for other viral diseases: Secondary | ICD-10-CM | POA: Diagnosis not present

## 2019-04-15 DIAGNOSIS — C50911 Malignant neoplasm of unspecified site of right female breast: Secondary | ICD-10-CM | POA: Diagnosis not present

## 2019-04-15 DIAGNOSIS — I890 Lymphedema, not elsewhere classified: Secondary | ICD-10-CM | POA: Diagnosis not present

## 2019-04-15 DIAGNOSIS — E1040 Type 1 diabetes mellitus with diabetic neuropathy, unspecified: Secondary | ICD-10-CM | POA: Diagnosis not present

## 2019-04-15 DIAGNOSIS — J449 Chronic obstructive pulmonary disease, unspecified: Secondary | ICD-10-CM | POA: Diagnosis not present

## 2019-04-15 DIAGNOSIS — I69351 Hemiplegia and hemiparesis following cerebral infarction affecting right dominant side: Secondary | ICD-10-CM | POA: Diagnosis not present

## 2019-04-15 DIAGNOSIS — R55 Syncope and collapse: Secondary | ICD-10-CM | POA: Diagnosis not present

## 2019-04-15 DIAGNOSIS — D630 Anemia in neoplastic disease: Secondary | ICD-10-CM | POA: Diagnosis not present

## 2019-04-16 ENCOUNTER — Telehealth: Payer: Self-pay

## 2019-04-16 DIAGNOSIS — R918 Other nonspecific abnormal finding of lung field: Secondary | ICD-10-CM | POA: Diagnosis not present

## 2019-04-16 DIAGNOSIS — I4891 Unspecified atrial fibrillation: Secondary | ICD-10-CM | POA: Diagnosis not present

## 2019-04-16 DIAGNOSIS — J811 Chronic pulmonary edema: Secondary | ICD-10-CM | POA: Diagnosis not present

## 2019-04-16 DIAGNOSIS — I130 Hypertensive heart and chronic kidney disease with heart failure and stage 1 through stage 4 chronic kidney disease, or unspecified chronic kidney disease: Secondary | ICD-10-CM | POA: Diagnosis not present

## 2019-04-16 DIAGNOSIS — T462X5A Adverse effect of other antidysrhythmic drugs, initial encounter: Secondary | ICD-10-CM | POA: Diagnosis not present

## 2019-04-16 DIAGNOSIS — I447 Left bundle-branch block, unspecified: Secondary | ICD-10-CM | POA: Diagnosis not present

## 2019-04-16 DIAGNOSIS — I440 Atrioventricular block, first degree: Secondary | ICD-10-CM | POA: Diagnosis not present

## 2019-04-16 DIAGNOSIS — I110 Hypertensive heart disease with heart failure: Secondary | ICD-10-CM | POA: Diagnosis not present

## 2019-04-16 DIAGNOSIS — Z880 Allergy status to penicillin: Secondary | ICD-10-CM | POA: Diagnosis not present

## 2019-04-16 DIAGNOSIS — I639 Cerebral infarction, unspecified: Secondary | ICD-10-CM | POA: Diagnosis not present

## 2019-04-16 DIAGNOSIS — N189 Chronic kidney disease, unspecified: Secondary | ICD-10-CM | POA: Diagnosis not present

## 2019-04-16 DIAGNOSIS — J8410 Pulmonary fibrosis, unspecified: Secondary | ICD-10-CM | POA: Diagnosis not present

## 2019-04-16 DIAGNOSIS — R197 Diarrhea, unspecified: Secondary | ICD-10-CM | POA: Diagnosis not present

## 2019-04-16 DIAGNOSIS — D61811 Other drug-induced pancytopenia: Secondary | ICD-10-CM | POA: Diagnosis not present

## 2019-04-16 DIAGNOSIS — I480 Paroxysmal atrial fibrillation: Secondary | ICD-10-CM | POA: Diagnosis not present

## 2019-04-16 DIAGNOSIS — I272 Pulmonary hypertension, unspecified: Secondary | ICD-10-CM | POA: Diagnosis not present

## 2019-04-16 DIAGNOSIS — I69321 Dysphasia following cerebral infarction: Secondary | ICD-10-CM | POA: Diagnosis not present

## 2019-04-16 DIAGNOSIS — R001 Bradycardia, unspecified: Secondary | ICD-10-CM | POA: Diagnosis not present

## 2019-04-16 DIAGNOSIS — I5023 Acute on chronic systolic (congestive) heart failure: Secondary | ICD-10-CM | POA: Diagnosis not present

## 2019-04-16 DIAGNOSIS — I499 Cardiac arrhythmia, unspecified: Secondary | ICD-10-CM | POA: Diagnosis not present

## 2019-04-16 DIAGNOSIS — Z79811 Long term (current) use of aromatase inhibitors: Secondary | ICD-10-CM | POA: Diagnosis not present

## 2019-04-16 DIAGNOSIS — C50919 Malignant neoplasm of unspecified site of unspecified female breast: Secondary | ICD-10-CM | POA: Diagnosis not present

## 2019-04-16 DIAGNOSIS — K719 Toxic liver disease, unspecified: Secondary | ICD-10-CM | POA: Diagnosis not present

## 2019-04-16 DIAGNOSIS — I441 Atrioventricular block, second degree: Secondary | ICD-10-CM | POA: Diagnosis not present

## 2019-04-16 DIAGNOSIS — Z743 Need for continuous supervision: Secondary | ICD-10-CM | POA: Diagnosis not present

## 2019-04-16 DIAGNOSIS — E119 Type 2 diabetes mellitus without complications: Secondary | ICD-10-CM | POA: Diagnosis not present

## 2019-04-16 DIAGNOSIS — E860 Dehydration: Secondary | ICD-10-CM | POA: Diagnosis not present

## 2019-04-16 DIAGNOSIS — Z79899 Other long term (current) drug therapy: Secondary | ICD-10-CM | POA: Diagnosis not present

## 2019-04-16 DIAGNOSIS — Z955 Presence of coronary angioplasty implant and graft: Secondary | ICD-10-CM | POA: Diagnosis not present

## 2019-04-16 DIAGNOSIS — R1310 Dysphagia, unspecified: Secondary | ICD-10-CM | POA: Diagnosis not present

## 2019-04-16 DIAGNOSIS — I509 Heart failure, unspecified: Secondary | ICD-10-CM | POA: Diagnosis not present

## 2019-04-16 DIAGNOSIS — J449 Chronic obstructive pulmonary disease, unspecified: Secondary | ICD-10-CM | POA: Diagnosis not present

## 2019-04-16 DIAGNOSIS — I2510 Atherosclerotic heart disease of native coronary artery without angina pectoris: Secondary | ICD-10-CM | POA: Diagnosis not present

## 2019-04-16 DIAGNOSIS — T451X5A Adverse effect of antineoplastic and immunosuppressive drugs, initial encounter: Secondary | ICD-10-CM | POA: Diagnosis not present

## 2019-04-16 DIAGNOSIS — Z794 Long term (current) use of insulin: Secondary | ICD-10-CM | POA: Diagnosis not present

## 2019-04-16 DIAGNOSIS — I429 Cardiomyopathy, unspecified: Secondary | ICD-10-CM | POA: Diagnosis not present

## 2019-04-16 DIAGNOSIS — E1122 Type 2 diabetes mellitus with diabetic chronic kidney disease: Secondary | ICD-10-CM | POA: Diagnosis not present

## 2019-04-16 DIAGNOSIS — N184 Chronic kidney disease, stage 4 (severe): Secondary | ICD-10-CM | POA: Diagnosis not present

## 2019-04-16 DIAGNOSIS — I890 Lymphedema, not elsewhere classified: Secondary | ICD-10-CM | POA: Diagnosis not present

## 2019-04-16 DIAGNOSIS — Z1159 Encounter for screening for other viral diseases: Secondary | ICD-10-CM | POA: Diagnosis not present

## 2019-04-16 DIAGNOSIS — K2970 Gastritis, unspecified, without bleeding: Secondary | ICD-10-CM | POA: Diagnosis not present

## 2019-04-16 DIAGNOSIS — Z7901 Long term (current) use of anticoagulants: Secondary | ICD-10-CM | POA: Diagnosis not present

## 2019-04-16 DIAGNOSIS — R55 Syncope and collapse: Secondary | ICD-10-CM | POA: Diagnosis not present

## 2019-04-16 DIAGNOSIS — M1990 Unspecified osteoarthritis, unspecified site: Secondary | ICD-10-CM | POA: Diagnosis not present

## 2019-04-16 DIAGNOSIS — I69351 Hemiplegia and hemiparesis following cerebral infarction affecting right dominant side: Secondary | ICD-10-CM | POA: Diagnosis not present

## 2019-04-16 DIAGNOSIS — Z950 Presence of cardiac pacemaker: Secondary | ICD-10-CM | POA: Diagnosis not present

## 2019-04-16 DIAGNOSIS — I69391 Dysphagia following cerebral infarction: Secondary | ICD-10-CM | POA: Diagnosis not present

## 2019-04-16 DIAGNOSIS — Z886 Allergy status to analgesic agent status: Secondary | ICD-10-CM | POA: Diagnosis not present

## 2019-04-19 ENCOUNTER — Ambulatory Visit: Payer: Self-pay

## 2019-04-19 DIAGNOSIS — E1122 Type 2 diabetes mellitus with diabetic chronic kidney disease: Secondary | ICD-10-CM

## 2019-04-19 DIAGNOSIS — Z794 Long term (current) use of insulin: Secondary | ICD-10-CM

## 2019-04-19 DIAGNOSIS — C50311 Malignant neoplasm of lower-inner quadrant of right female breast: Secondary | ICD-10-CM

## 2019-04-19 DIAGNOSIS — K2970 Gastritis, unspecified, without bleeding: Secondary | ICD-10-CM | POA: Diagnosis not present

## 2019-04-19 DIAGNOSIS — I130 Hypertensive heart and chronic kidney disease with heart failure and stage 1 through stage 4 chronic kidney disease, or unspecified chronic kidney disease: Secondary | ICD-10-CM

## 2019-04-19 DIAGNOSIS — D538 Other specified nutritional anemias: Secondary | ICD-10-CM | POA: Diagnosis not present

## 2019-04-19 DIAGNOSIS — I5023 Acute on chronic systolic (congestive) heart failure: Secondary | ICD-10-CM | POA: Diagnosis not present

## 2019-04-19 DIAGNOSIS — I5032 Chronic diastolic (congestive) heart failure: Secondary | ICD-10-CM

## 2019-04-19 DIAGNOSIS — I480 Paroxysmal atrial fibrillation: Secondary | ICD-10-CM | POA: Diagnosis not present

## 2019-04-19 DIAGNOSIS — N184 Chronic kidney disease, stage 4 (severe): Secondary | ICD-10-CM | POA: Diagnosis not present

## 2019-04-19 DIAGNOSIS — Z170 Estrogen receptor positive status [ER+]: Secondary | ICD-10-CM

## 2019-04-19 NOTE — Chronic Care Management (AMB) (Signed)
  Chronic Care Management   Follow Up Note   04/19/2019 Name: [name] MRN: [id number] DOB: 1933/02/19  Referred by: [name], MD Reason for referral : Chronic Care Management (CCM RNCM Inbound Email from caregiver )   [name] is a 85 y.o. year old female who is a primary care patient of [name], MD. The CCM team was consulted for assistance with chronic disease management and care coordination needs.    Review of patient status, including review of consultants reports, relevant laboratory and other test results, and collaboration with appropriate care team members and the patient's provider was performed as part of comprehensive patient evaluation and provision of chronic care management services.    I received and email from [name] granddaughter [name] with an update regarding a recent IP event.   Goals Addressed    . Marland KitchenHigh Risk for Knowledge Deficit for CHF        Current Barriers:  [name] Knowledge Deficits related to disease process and Self Health management for CHF   Nurse Case Manager Clinical Goal(s):  [name] Over the next 60 days, patient will verbalize basic understanding of CHF disease process and self health management plan as evidenced by patient and caregiver will verbalize better understanding of the disease process for CHF and will verbalize increased understanding of MD recommendations for CHF disease management. 04/04/19 re-established goal date to 60 days due to treatment delays secondary to Corona virus  CCM RN CM Interventions:  04/19/19: Received incoming email from granddaughter [name]:  Good Morning,   Please find attached the hospital discharge papers for my Grandmother [name] - DOB 1933-08-14.  She was transferred to [facility] on 04/15/2019 for a Pacemaker.   Please ensure her medical physicians receive a copy.   Respectfully,  [name]   . In-basket message  forwarded to [name]  . Medical record attached forwarded to office manager Misti Sellers with request to have document hived to patient's EMR . Planned call to f/u with patient and granddaughter on 05/02/19  Patient Self Care Activities:  . Attends all scheduled provider appointments . Calls pharmacy for medication refills . Calls provider office for new concerns or questions  Please see past updates related to this goal by clicking on the "Past Updates" button in the selected goal         Telephone follow up appointment with care management team member scheduled for: 05/02/19   [name], RN, BSN, CCM Care Management Coordinator [place] [facility]  Direct Phone: [phone]

## 2019-04-20 DIAGNOSIS — E1040 Type 1 diabetes mellitus with diabetic neuropathy, unspecified: Secondary | ICD-10-CM | POA: Diagnosis not present

## 2019-04-20 DIAGNOSIS — C50911 Malignant neoplasm of unspecified site of right female breast: Secondary | ICD-10-CM | POA: Diagnosis not present

## 2019-04-20 DIAGNOSIS — D630 Anemia in neoplastic disease: Secondary | ICD-10-CM | POA: Diagnosis not present

## 2019-04-20 DIAGNOSIS — I10 Essential (primary) hypertension: Secondary | ICD-10-CM | POA: Diagnosis not present

## 2019-04-20 DIAGNOSIS — I4891 Unspecified atrial fibrillation: Secondary | ICD-10-CM | POA: Diagnosis not present

## 2019-04-22 DIAGNOSIS — Z853 Personal history of malignant neoplasm of breast: Secondary | ICD-10-CM | POA: Diagnosis not present

## 2019-04-22 DIAGNOSIS — E1121 Type 2 diabetes mellitus with diabetic nephropathy: Secondary | ICD-10-CM | POA: Diagnosis not present

## 2019-04-22 DIAGNOSIS — I4891 Unspecified atrial fibrillation: Secondary | ICD-10-CM | POA: Diagnosis not present

## 2019-04-22 DIAGNOSIS — I10 Essential (primary) hypertension: Secondary | ICD-10-CM | POA: Diagnosis not present

## 2019-04-22 DIAGNOSIS — E1040 Type 1 diabetes mellitus with diabetic neuropathy, unspecified: Secondary | ICD-10-CM | POA: Diagnosis not present

## 2019-04-22 DIAGNOSIS — C50911 Malignant neoplasm of unspecified site of right female breast: Secondary | ICD-10-CM | POA: Diagnosis not present

## 2019-04-22 DIAGNOSIS — N183 Chronic kidney disease, stage 3 (moderate): Secondary | ICD-10-CM | POA: Diagnosis not present

## 2019-04-22 DIAGNOSIS — D630 Anemia in neoplastic disease: Secondary | ICD-10-CM | POA: Diagnosis not present

## 2019-04-22 DIAGNOSIS — I509 Heart failure, unspecified: Secondary | ICD-10-CM | POA: Diagnosis not present

## 2019-04-23 DIAGNOSIS — N183 Chronic kidney disease, stage 3 (moderate): Secondary | ICD-10-CM | POA: Diagnosis not present

## 2019-04-23 DIAGNOSIS — Z950 Presence of cardiac pacemaker: Secondary | ICD-10-CM | POA: Diagnosis not present

## 2019-04-23 DIAGNOSIS — I5032 Chronic diastolic (congestive) heart failure: Secondary | ICD-10-CM | POA: Diagnosis not present

## 2019-04-23 DIAGNOSIS — I480 Paroxysmal atrial fibrillation: Secondary | ICD-10-CM | POA: Diagnosis not present

## 2019-04-23 DIAGNOSIS — R55 Syncope and collapse: Secondary | ICD-10-CM | POA: Diagnosis not present

## 2019-04-24 DIAGNOSIS — D630 Anemia in neoplastic disease: Secondary | ICD-10-CM | POA: Diagnosis not present

## 2019-04-24 DIAGNOSIS — I4891 Unspecified atrial fibrillation: Secondary | ICD-10-CM | POA: Diagnosis not present

## 2019-04-24 DIAGNOSIS — I10 Essential (primary) hypertension: Secondary | ICD-10-CM | POA: Diagnosis not present

## 2019-04-24 DIAGNOSIS — C50911 Malignant neoplasm of unspecified site of right female breast: Secondary | ICD-10-CM | POA: Diagnosis not present

## 2019-04-24 DIAGNOSIS — E1040 Type 1 diabetes mellitus with diabetic neuropathy, unspecified: Secondary | ICD-10-CM | POA: Diagnosis not present

## 2019-04-25 DIAGNOSIS — L84 Corns and callosities: Secondary | ICD-10-CM | POA: Diagnosis not present

## 2019-04-25 DIAGNOSIS — Z789 Other specified health status: Secondary | ICD-10-CM | POA: Diagnosis not present

## 2019-04-25 DIAGNOSIS — M79609 Pain in unspecified limb: Secondary | ICD-10-CM | POA: Diagnosis not present

## 2019-04-25 DIAGNOSIS — I70209 Unspecified atherosclerosis of native arteries of extremities, unspecified extremity: Secondary | ICD-10-CM | POA: Diagnosis not present

## 2019-04-25 DIAGNOSIS — K2940 Chronic atrophic gastritis without bleeding: Secondary | ICD-10-CM | POA: Diagnosis not present

## 2019-04-25 DIAGNOSIS — R1319 Other dysphagia: Secondary | ICD-10-CM | POA: Diagnosis not present

## 2019-04-26 DIAGNOSIS — R55 Syncope and collapse: Secondary | ICD-10-CM | POA: Diagnosis not present

## 2019-04-26 DIAGNOSIS — D630 Anemia in neoplastic disease: Secondary | ICD-10-CM | POA: Diagnosis not present

## 2019-04-26 DIAGNOSIS — C50911 Malignant neoplasm of unspecified site of right female breast: Secondary | ICD-10-CM | POA: Diagnosis not present

## 2019-04-26 DIAGNOSIS — I10 Essential (primary) hypertension: Secondary | ICD-10-CM | POA: Diagnosis not present

## 2019-04-26 DIAGNOSIS — E1040 Type 1 diabetes mellitus with diabetic neuropathy, unspecified: Secondary | ICD-10-CM | POA: Diagnosis not present

## 2019-04-26 DIAGNOSIS — I4891 Unspecified atrial fibrillation: Secondary | ICD-10-CM | POA: Diagnosis not present

## 2019-04-29 DIAGNOSIS — E1040 Type 1 diabetes mellitus with diabetic neuropathy, unspecified: Secondary | ICD-10-CM | POA: Diagnosis not present

## 2019-04-29 DIAGNOSIS — N200 Calculus of kidney: Secondary | ICD-10-CM | POA: Diagnosis not present

## 2019-04-29 DIAGNOSIS — N183 Chronic kidney disease, stage 3 (moderate): Secondary | ICD-10-CM | POA: Diagnosis not present

## 2019-04-29 DIAGNOSIS — I10 Essential (primary) hypertension: Secondary | ICD-10-CM | POA: Diagnosis not present

## 2019-04-29 DIAGNOSIS — I4891 Unspecified atrial fibrillation: Secondary | ICD-10-CM | POA: Diagnosis not present

## 2019-04-29 DIAGNOSIS — D630 Anemia in neoplastic disease: Secondary | ICD-10-CM | POA: Diagnosis not present

## 2019-04-29 DIAGNOSIS — I129 Hypertensive chronic kidney disease with stage 1 through stage 4 chronic kidney disease, or unspecified chronic kidney disease: Secondary | ICD-10-CM | POA: Diagnosis not present

## 2019-04-29 DIAGNOSIS — N281 Cyst of kidney, acquired: Secondary | ICD-10-CM | POA: Diagnosis not present

## 2019-04-29 DIAGNOSIS — E1121 Type 2 diabetes mellitus with diabetic nephropathy: Secondary | ICD-10-CM | POA: Diagnosis not present

## 2019-04-29 DIAGNOSIS — C50911 Malignant neoplasm of unspecified site of right female breast: Secondary | ICD-10-CM | POA: Diagnosis not present

## 2019-04-30 DIAGNOSIS — I10 Essential (primary) hypertension: Secondary | ICD-10-CM | POA: Diagnosis not present

## 2019-04-30 DIAGNOSIS — C50911 Malignant neoplasm of unspecified site of right female breast: Secondary | ICD-10-CM | POA: Diagnosis not present

## 2019-04-30 DIAGNOSIS — D630 Anemia in neoplastic disease: Secondary | ICD-10-CM | POA: Diagnosis not present

## 2019-04-30 DIAGNOSIS — I4891 Unspecified atrial fibrillation: Secondary | ICD-10-CM | POA: Diagnosis not present

## 2019-04-30 DIAGNOSIS — E1040 Type 1 diabetes mellitus with diabetic neuropathy, unspecified: Secondary | ICD-10-CM | POA: Diagnosis not present

## 2019-05-01 DIAGNOSIS — E1121 Type 2 diabetes mellitus with diabetic nephropathy: Secondary | ICD-10-CM | POA: Diagnosis not present

## 2019-05-01 DIAGNOSIS — D61818 Other pancytopenia: Secondary | ICD-10-CM | POA: Diagnosis not present

## 2019-05-01 DIAGNOSIS — Z7901 Long term (current) use of anticoagulants: Secondary | ICD-10-CM | POA: Diagnosis not present

## 2019-05-01 DIAGNOSIS — N183 Chronic kidney disease, stage 3 (moderate): Secondary | ICD-10-CM | POA: Diagnosis not present

## 2019-05-01 DIAGNOSIS — I10 Essential (primary) hypertension: Secondary | ICD-10-CM | POA: Diagnosis not present

## 2019-05-01 DIAGNOSIS — Z853 Personal history of malignant neoplasm of breast: Secondary | ICD-10-CM | POA: Diagnosis not present

## 2019-05-01 DIAGNOSIS — I872 Venous insufficiency (chronic) (peripheral): Secondary | ICD-10-CM | POA: Diagnosis not present

## 2019-05-02 ENCOUNTER — Telehealth: Payer: Self-pay

## 2019-05-02 DIAGNOSIS — E1040 Type 1 diabetes mellitus with diabetic neuropathy, unspecified: Secondary | ICD-10-CM | POA: Diagnosis not present

## 2019-05-02 DIAGNOSIS — I4891 Unspecified atrial fibrillation: Secondary | ICD-10-CM | POA: Diagnosis not present

## 2019-05-02 DIAGNOSIS — C50911 Malignant neoplasm of unspecified site of right female breast: Secondary | ICD-10-CM | POA: Diagnosis not present

## 2019-05-02 DIAGNOSIS — I10 Essential (primary) hypertension: Secondary | ICD-10-CM | POA: Diagnosis not present

## 2019-05-02 DIAGNOSIS — D630 Anemia in neoplastic disease: Secondary | ICD-10-CM | POA: Diagnosis not present

## 2019-05-03 ENCOUNTER — Other Ambulatory Visit: Payer: Self-pay | Admitting: Internal Medicine

## 2019-05-03 DIAGNOSIS — C50911 Malignant neoplasm of unspecified site of right female breast: Secondary | ICD-10-CM | POA: Diagnosis not present

## 2019-05-03 DIAGNOSIS — I4891 Unspecified atrial fibrillation: Secondary | ICD-10-CM | POA: Diagnosis not present

## 2019-05-03 DIAGNOSIS — E1040 Type 1 diabetes mellitus with diabetic neuropathy, unspecified: Secondary | ICD-10-CM | POA: Diagnosis not present

## 2019-05-03 DIAGNOSIS — I10 Essential (primary) hypertension: Secondary | ICD-10-CM | POA: Diagnosis not present

## 2019-05-03 DIAGNOSIS — D630 Anemia in neoplastic disease: Secondary | ICD-10-CM | POA: Diagnosis not present

## 2019-05-06 DIAGNOSIS — I10 Essential (primary) hypertension: Secondary | ICD-10-CM | POA: Diagnosis not present

## 2019-05-06 DIAGNOSIS — I509 Heart failure, unspecified: Secondary | ICD-10-CM | POA: Diagnosis not present

## 2019-05-06 DIAGNOSIS — E1121 Type 2 diabetes mellitus with diabetic nephropathy: Secondary | ICD-10-CM | POA: Diagnosis not present

## 2019-05-06 DIAGNOSIS — N183 Chronic kidney disease, stage 3 (moderate): Secondary | ICD-10-CM | POA: Diagnosis not present

## 2019-05-06 DIAGNOSIS — Z853 Personal history of malignant neoplasm of breast: Secondary | ICD-10-CM | POA: Diagnosis not present

## 2019-05-07 DIAGNOSIS — E1040 Type 1 diabetes mellitus with diabetic neuropathy, unspecified: Secondary | ICD-10-CM | POA: Diagnosis not present

## 2019-05-07 DIAGNOSIS — D630 Anemia in neoplastic disease: Secondary | ICD-10-CM | POA: Diagnosis not present

## 2019-05-07 DIAGNOSIS — Z1159 Encounter for screening for other viral diseases: Secondary | ICD-10-CM | POA: Diagnosis not present

## 2019-05-07 DIAGNOSIS — I4891 Unspecified atrial fibrillation: Secondary | ICD-10-CM | POA: Diagnosis not present

## 2019-05-07 DIAGNOSIS — C50911 Malignant neoplasm of unspecified site of right female breast: Secondary | ICD-10-CM | POA: Diagnosis not present

## 2019-05-07 DIAGNOSIS — I10 Essential (primary) hypertension: Secondary | ICD-10-CM | POA: Diagnosis not present

## 2019-05-08 ENCOUNTER — Ambulatory Visit: Payer: Medicare Other | Admitting: Internal Medicine

## 2019-05-08 DIAGNOSIS — C50911 Malignant neoplasm of unspecified site of right female breast: Secondary | ICD-10-CM | POA: Diagnosis not present

## 2019-05-08 DIAGNOSIS — D630 Anemia in neoplastic disease: Secondary | ICD-10-CM | POA: Diagnosis not present

## 2019-05-08 DIAGNOSIS — I10 Essential (primary) hypertension: Secondary | ICD-10-CM | POA: Diagnosis not present

## 2019-05-08 DIAGNOSIS — E1040 Type 1 diabetes mellitus with diabetic neuropathy, unspecified: Secondary | ICD-10-CM | POA: Diagnosis not present

## 2019-05-08 DIAGNOSIS — I4891 Unspecified atrial fibrillation: Secondary | ICD-10-CM | POA: Diagnosis not present

## 2019-05-09 DIAGNOSIS — Z886 Allergy status to analgesic agent status: Secondary | ICD-10-CM | POA: Diagnosis not present

## 2019-05-09 DIAGNOSIS — Z8673 Personal history of transient ischemic attack (TIA), and cerebral infarction without residual deficits: Secondary | ICD-10-CM | POA: Diagnosis not present

## 2019-05-09 DIAGNOSIS — C50911 Malignant neoplasm of unspecified site of right female breast: Secondary | ICD-10-CM | POA: Diagnosis not present

## 2019-05-09 DIAGNOSIS — E1040 Type 1 diabetes mellitus with diabetic neuropathy, unspecified: Secondary | ICD-10-CM | POA: Diagnosis not present

## 2019-05-09 DIAGNOSIS — K449 Diaphragmatic hernia without obstruction or gangrene: Secondary | ICD-10-CM | POA: Diagnosis not present

## 2019-05-09 DIAGNOSIS — Z79899 Other long term (current) drug therapy: Secondary | ICD-10-CM | POA: Diagnosis not present

## 2019-05-09 DIAGNOSIS — Z794 Long term (current) use of insulin: Secondary | ICD-10-CM | POA: Diagnosis not present

## 2019-05-09 DIAGNOSIS — Z950 Presence of cardiac pacemaker: Secondary | ICD-10-CM | POA: Diagnosis not present

## 2019-05-09 DIAGNOSIS — I639 Cerebral infarction, unspecified: Secondary | ICD-10-CM | POA: Diagnosis not present

## 2019-05-09 DIAGNOSIS — K2970 Gastritis, unspecified, without bleeding: Secondary | ICD-10-CM | POA: Diagnosis not present

## 2019-05-09 DIAGNOSIS — Z880 Allergy status to penicillin: Secondary | ICD-10-CM | POA: Diagnosis not present

## 2019-05-09 DIAGNOSIS — I10 Essential (primary) hypertension: Secondary | ICD-10-CM | POA: Diagnosis not present

## 2019-05-09 DIAGNOSIS — Z86718 Personal history of other venous thrombosis and embolism: Secondary | ICD-10-CM | POA: Diagnosis not present

## 2019-05-09 DIAGNOSIS — I509 Heart failure, unspecified: Secondary | ICD-10-CM | POA: Diagnosis not present

## 2019-05-09 DIAGNOSIS — Z8249 Family history of ischemic heart disease and other diseases of the circulatory system: Secondary | ICD-10-CM | POA: Diagnosis not present

## 2019-05-09 DIAGNOSIS — K222 Esophageal obstruction: Secondary | ICD-10-CM | POA: Diagnosis not present

## 2019-05-09 DIAGNOSIS — E1122 Type 2 diabetes mellitus with diabetic chronic kidney disease: Secondary | ICD-10-CM | POA: Diagnosis not present

## 2019-05-09 DIAGNOSIS — Z7901 Long term (current) use of anticoagulants: Secondary | ICD-10-CM | POA: Diagnosis not present

## 2019-05-09 DIAGNOSIS — K317 Polyp of stomach and duodenum: Secondary | ICD-10-CM | POA: Diagnosis not present

## 2019-05-09 DIAGNOSIS — R1319 Other dysphagia: Secondary | ICD-10-CM | POA: Diagnosis not present

## 2019-05-09 DIAGNOSIS — D630 Anemia in neoplastic disease: Secondary | ICD-10-CM | POA: Diagnosis not present

## 2019-05-09 DIAGNOSIS — K3189 Other diseases of stomach and duodenum: Secondary | ICD-10-CM | POA: Diagnosis not present

## 2019-05-09 DIAGNOSIS — N183 Chronic kidney disease, stage 3 (moderate): Secondary | ICD-10-CM | POA: Diagnosis not present

## 2019-05-09 DIAGNOSIS — I130 Hypertensive heart and chronic kidney disease with heart failure and stage 1 through stage 4 chronic kidney disease, or unspecified chronic kidney disease: Secondary | ICD-10-CM | POA: Diagnosis not present

## 2019-05-09 DIAGNOSIS — K2950 Unspecified chronic gastritis without bleeding: Secondary | ICD-10-CM | POA: Diagnosis not present

## 2019-05-09 DIAGNOSIS — I4891 Unspecified atrial fibrillation: Secondary | ICD-10-CM | POA: Diagnosis not present

## 2019-05-10 DIAGNOSIS — I110 Hypertensive heart disease with heart failure: Secondary | ICD-10-CM | POA: Diagnosis not present

## 2019-05-13 ENCOUNTER — Encounter: Payer: Self-pay | Admitting: Internal Medicine

## 2019-05-13 ENCOUNTER — Other Ambulatory Visit: Payer: Self-pay

## 2019-05-13 ENCOUNTER — Ambulatory Visit (INDEPENDENT_AMBULATORY_CARE_PROVIDER_SITE_OTHER): Payer: Medicare Other | Admitting: Internal Medicine

## 2019-05-13 VITALS — Ht 65.0 in

## 2019-05-13 DIAGNOSIS — I130 Hypertensive heart and chronic kidney disease with heart failure and stage 1 through stage 4 chronic kidney disease, or unspecified chronic kidney disease: Secondary | ICD-10-CM | POA: Diagnosis not present

## 2019-05-13 DIAGNOSIS — N184 Chronic kidney disease, stage 4 (severe): Secondary | ICD-10-CM

## 2019-05-13 DIAGNOSIS — I5032 Chronic diastolic (congestive) heart failure: Secondary | ICD-10-CM

## 2019-05-13 DIAGNOSIS — R001 Bradycardia, unspecified: Secondary | ICD-10-CM | POA: Diagnosis not present

## 2019-05-13 DIAGNOSIS — I10 Essential (primary) hypertension: Secondary | ICD-10-CM | POA: Diagnosis not present

## 2019-05-13 DIAGNOSIS — I480 Paroxysmal atrial fibrillation: Secondary | ICD-10-CM | POA: Diagnosis not present

## 2019-05-13 DIAGNOSIS — K2970 Gastritis, unspecified, without bleeding: Secondary | ICD-10-CM | POA: Diagnosis not present

## 2019-05-13 DIAGNOSIS — Z794 Long term (current) use of insulin: Secondary | ICD-10-CM

## 2019-05-13 DIAGNOSIS — C50911 Malignant neoplasm of unspecified site of right female breast: Secondary | ICD-10-CM | POA: Diagnosis not present

## 2019-05-13 DIAGNOSIS — K449 Diaphragmatic hernia without obstruction or gangrene: Secondary | ICD-10-CM

## 2019-05-13 DIAGNOSIS — Z950 Presence of cardiac pacemaker: Secondary | ICD-10-CM

## 2019-05-13 DIAGNOSIS — E1040 Type 1 diabetes mellitus with diabetic neuropathy, unspecified: Secondary | ICD-10-CM | POA: Diagnosis not present

## 2019-05-13 DIAGNOSIS — D538 Other specified nutritional anemias: Secondary | ICD-10-CM | POA: Diagnosis not present

## 2019-05-13 DIAGNOSIS — E1122 Type 2 diabetes mellitus with diabetic chronic kidney disease: Secondary | ICD-10-CM | POA: Diagnosis not present

## 2019-05-13 DIAGNOSIS — I4891 Unspecified atrial fibrillation: Secondary | ICD-10-CM | POA: Diagnosis not present

## 2019-05-13 DIAGNOSIS — D630 Anemia in neoplastic disease: Secondary | ICD-10-CM | POA: Diagnosis not present

## 2019-05-13 MED ORDER — PREGABALIN 75 MG PO CAPS: 75.0000 mg | ORAL_CAPSULE | Freq: Every day | ORAL | 2 refills | Status: DC

## 2019-05-13 MED ORDER — OXYBUTYNIN CHLORIDE 5 MG PO TABS: 5.0000 mg | ORAL_TABLET | Freq: Two times a day (BID) | ORAL | 1 refills | Status: DC

## 2019-05-13 NOTE — Progress Notes (Signed)
Virtual Visit via Video   This visit type was conducted due to national recommendations for restrictions regarding the COVID-19 Pandemic (e.g. social distancing) in an effort to limit this patient's exposure and mitigate transmission in our community.  Due to her co-morbid illnesses, this patient is at least at moderate risk for complications without adequate follow up.  This format is felt to be most appropriate for this patient at this time.  All issues noted in this document were discussed and addressed.  A limited physical exam was performed with this format.    This visit type was conducted due to national recommendations for restrictions regarding the COVID-19 Pandemic (e.g. social distancing) in an effort to limit this patient's exposure and mitigate transmission in our community.  Patients identity confirmed using two different identifiers.  This format is felt to be most appropriate for this patient at this time.  All issues noted in this document were discussed and addressed.  No physical exam was performed (except for noted visual exam findings with Video Visits).    Date:  05/13/2019   ID:  [name], DOB [birth year], MRN [id number]  Patient Location:  Home  Provider location:   Office    Chief Complaint:  I am here for DM check.   History of Present Illness:    [name] is a 85 y.o. female who presents via video conferencing for a telehealth visit today.    The patient does not have symptoms concerning for COVID-19 infection (fever, chills, cough, or new shortness of breath).   She presents today for virtual visit. She prefers this method of contact due to COVID-19 pandemic.  She is still living in [place], [place] with her granddaughter. She plans to move back to [place] within the next 90 days. She does not wish to move back in with her daughter. Her preference is to move into a skilled nursing facility.    Update: She recently had pacemaker placement for symptomatic  bradycardia. Performed on 04/18/2019. Additionally, she was having swallowing issues and EGD was performed. This was significant for gastric polyps and hiatal hernia. The polyps were removed. Pathology results are pending.   Diabetes She presents for her follow-up diabetic visit. She has type 2 diabetes mellitus. Hypoglycemia symptoms include dizziness, nervousness/anxiousness and sweats. There are no diabetic associated symptoms. Risk factors for coronary artery disease include diabetes mellitus, dyslipidemia, obesity, hypertension, sedentary lifestyle and post-menopausal. She is following a diabetic diet. She never participates in exercise. Eye exam is not current.  Hypertension This is a chronic problem. The current episode started more than 1 year ago. The problem has been gradually improving since onset. The problem is controlled. Associated symptoms include sweats. The current treatment provides moderate improvement.     Past Medical History:  Diagnosis Date  . Anemia   . Arthritis   . Cancer ([place])   . Cataract   . Chronic diastolic CHF (congestive heart failure) ([place])   . CKD (chronic kidney disease), stage III ([place])   . Clotting disorder ([place])   . Diabetes mellitus ([place])   . Dizziness and giddiness   . DVT (deep venous thrombosis) ([place])    a. On Coumadin for this.  . Essential hypertension   . LBBB (left bundle branch block)   . Mitral regurgitation    a. Mod MR by echo 2014.  . Muscular deconditioning   . Obesity   . SVT (supraventricular tachycardia) ([place])    a. In 2013 she had  an EPS with ablation for SVT which did not eliminate the SVT completely. She also had bradycardia which limited medication. She was placed on amiodarone by Dr. Lovena Le.  . Thrombocytopenia ([place]) 11/21/2011   Past Surgical History:  Procedure Laterality Date  . EYE SURGERY    . SUPRAVENTRICULAR TACHYCARDIA ABLATION N/A 10/11/2012   Procedure: SUPRAVENTRICULAR TACHYCARDIA ABLATION;  Surgeon: Evans Lance, [place];  Location: [facility] CATH LAB;  Service: Cardiovascular;  Laterality: N/A;     Current Meds  Medication Sig  . acetaminophen (TYLENOL) 500 MG tablet Take 1,000 mg by mouth every 6 (six) hours as needed.  [name] amiodarone (PACERONE) 200 MG tablet Take 1 tablet (200 mg total) by mouth daily.  [name] anastrozole (ARIMIDEX) 1 MG tablet Take 1 tablet (1 mg total) by mouth daily.  [name] apixaban (ELIQUIS) 5 MG TABS tablet Take 1 tablet (5 mg total) by mouth 2 (two) times daily.  [name] atorvastatin (LIPITOR) 20 MG tablet Take 1 tablet (20 mg total) by mouth at bedtime.  . BD PEN NEEDLE NANO U/F 32G X 4 MM MISC Use as directed  . bumetanide (BUMEX) 1 MG tablet Take 1 mg by mouth 2 (two) times daily. 1 and 1/2 tab in the am and 1 tab in the pm  . calcium carbonate (OS-CAL) 600 MG TABS Take 600 mg by mouth daily with breakfast.   . carvedilol (COREG) 12.5 MG tablet Take 1 tablet (12.5 mg total) by mouth 2 (two) times daily.  . diclofenac sodium (VOLTAREN) 1 % GEL Apply 2 g topically 4 (four) times daily.  [name] glucose blood (ACCU-CHEK GUIDE) test strip Use as instructed to check blood sugars 3 times per day prn dx: e11.22  . HUMALOG KWIKPEN 200 UNIT/ML SOPN Inject 4-8 Units into the skin 3 (three) times daily. Per sliding scale  . isosorbide-hydrALAZINE (BIDIL) 20-37.5 MG tablet BiDil 20 mg-37.5 mg tablet  Take 1 tablet twice a day by oral route.  [name] LEVEMIR FLEXTOUCH 100 UNIT/ML Pen Inject 10-16 Units into the skin daily. 16 units in the am and 10 units in the evening  . magnesium oxide (MAG-OX) 400 MG tablet Take 400 mg by mouth daily.  [name] omeprazole (PRILOSEC) 40 MG capsule TAKE ONE CAPSULE BY MOUTH DAILY BEFORE A MEAL  . oxybutynin (DITROPAN) 5 MG tablet Take 1 tablet (5 mg total) by mouth 2 (two) times daily.  . potassium chloride SA (K-DUR) 20 MEQ tablet Take 2 tablets (40 mEq total) by mouth daily.  . pregabalin (LYRICA) 75 MG capsule Take 1 capsule (75 mg total) by mouth at bedtime.  . traMADol (ULTRAM) 50 MG  tablet Take 50 mg by mouth 2 (two) times daily as needed for pain.  . [DISCONTINUED] oxybutynin (DITROPAN) 5 MG tablet Take 1 tablet (5 mg total) by mouth 2 (two) times daily.  . [DISCONTINUED] pregabalin (LYRICA) 75 MG capsule Take 1 capsule (75 mg total) by mouth at bedtime.     Allergies:   Aspirin and Penicillins   Social History   Tobacco Use  . Smoking status: Never Smoker  . Smokeless tobacco: Never Used  Substance Use Topics  . Alcohol use: No  . Drug use: No     Family Hx: The patient's family history includes Breast cancer in her maternal aunt; Cancer in her father; Heart attack in her brother; Hypertension in her brother, daughter, and sister; Stroke in her mother and sister.  ROS:   Please see the history of present illness.  Review of Systems  Constitutional: Negative.   Respiratory: Negative.   Cardiovascular: Negative.   Gastrointestinal: Negative.   Neurological: Positive for dizziness.  Psychiatric/Behavioral: The patient is nervous/anxious.     All other systems reviewed and are negative.   Labs/Other Tests and Data Reviewed:    Recent Labs: 08/06/2018: NT-Pro BNP 1,604 01/01/2019: ALT 26 01/15/2019: BUN 19; Creatinine, Ser 1.36; Hemoglobin 10.3; Platelets 200; Potassium 4.6; Sodium 142   Recent Lipid Panel Lab Results  Component Value Date/Time   CHOL 195 08/15/2018 12:36 PM   TRIG 56 01/02/2019 02:25 PM   HDL 81 08/15/2018 12:36 PM   CHOLHDL 2.4 08/15/2018 12:36 PM   CHOLHDL 2.6 12/11/2008 01:30 AM   LDLCALC 96 08/15/2018 12:36 PM    Wt Readings from Last 3 Encounters:  04/02/19 192 lb (87.1 kg)  01/01/19 197 lb (89.4 kg)  12/07/18 188 lb (85.3 kg)     Exam:    Vital Signs:  Ht 5\' 5"  (1.651 m)   BMI 31.95 kg/m     Physical Exam  Constitutional: She is oriented to person, place, and time and well-developed, well-nourished, and in no distress.  HENT:  Head: Normocephalic and atraumatic.  Pulmonary/Chest: Effort normal.  Pacemaker  located left anterior chest. No surrounding erythema.   Neurological: She is alert and oriented to person, place, and time.  Psychiatric: Affect normal.  Nursing note and vitals reviewed.   ASSESSMENT & PLAN:     1. Type 2 diabetes mellitus with stage 4 chronic kidney disease, with long-term current use of insulin (HCC)  Due to the hypoglycemia in the morning, we will decrease Levemir to 8 units nightly. She is encouraged to continue to keep BS log. A rx for labwork will be mailed to the patient.   2. 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 Importance of salt restriction was again stressed to the patient. I, along with her granddaughter, went through her recent discharge summary.   3. Chronic diastolic heart failure (HCC)  Chronic, yet stable. Importance of optimal bp control was stressed to the patient.   4. Symptomatic bradycardia  Pt is s/p syncopal episode, found to be bradycardic with HR in 40s. She is s/p pacemaker placement on April 18, 2019.   5. Hiatal hernia  She is s/p EGD and was found to also have gastric polyps.  She is advised to continue with current reflux meds for now. Also important to stop eating 3 hours prior to going to bed. Barium swallowing study is pending.     COVID-19 Education: The signs and symptoms of COVID-19 were discussed with the patient and how to seek care for testing (follow up with PCP or arrange E-visit).  The importance of social distancing was discussed today.  Patient Risk:   After full review of this patients clinical status, I feel that they are at least moderate risk at this time.  Time:   Today, I have spent 38 minutes/ 10 seconds with the patient with telehealth technology discussing above diagnoses.     Medication Adjustments/Labs and Tests Ordered: Current medicines are reviewed at length with the patient today.  Concerns regarding medicines are outlined above.   Tests Ordered: No orders of the  defined types were placed in this encounter.   Medication Changes: Meds ordered this encounter  Medications  . oxybutynin (DITROPAN) 5 MG tablet    Sig: Take 1 tablet (5 mg total) by mouth 2 (two) times daily.  Dispense:  180 tablet    Refill:  1  . pregabalin (LYRICA) 75 MG capsule    Sig: Take 1 capsule (75 mg total) by mouth at bedtime.    Dispense:  90 capsule    Refill:  2    Disposition:  Follow up in 8 week(s)  Signed, Maximino Greenland, MD

## 2019-05-14 DIAGNOSIS — I10 Essential (primary) hypertension: Secondary | ICD-10-CM | POA: Diagnosis not present

## 2019-05-14 DIAGNOSIS — C50911 Malignant neoplasm of unspecified site of right female breast: Secondary | ICD-10-CM | POA: Diagnosis not present

## 2019-05-14 DIAGNOSIS — I4891 Unspecified atrial fibrillation: Secondary | ICD-10-CM | POA: Diagnosis not present

## 2019-05-14 DIAGNOSIS — D630 Anemia in neoplastic disease: Secondary | ICD-10-CM | POA: Diagnosis not present

## 2019-05-14 DIAGNOSIS — E1040 Type 1 diabetes mellitus with diabetic neuropathy, unspecified: Secondary | ICD-10-CM | POA: Diagnosis not present

## 2019-05-16 DIAGNOSIS — I4891 Unspecified atrial fibrillation: Secondary | ICD-10-CM | POA: Diagnosis not present

## 2019-05-16 DIAGNOSIS — I10 Essential (primary) hypertension: Secondary | ICD-10-CM | POA: Diagnosis not present

## 2019-05-16 DIAGNOSIS — E1040 Type 1 diabetes mellitus with diabetic neuropathy, unspecified: Secondary | ICD-10-CM | POA: Diagnosis not present

## 2019-05-16 DIAGNOSIS — D630 Anemia in neoplastic disease: Secondary | ICD-10-CM | POA: Diagnosis not present

## 2019-05-16 DIAGNOSIS — C50911 Malignant neoplasm of unspecified site of right female breast: Secondary | ICD-10-CM | POA: Diagnosis not present

## 2019-05-20 ENCOUNTER — Ambulatory Visit (INDEPENDENT_AMBULATORY_CARE_PROVIDER_SITE_OTHER): Payer: Medicare Other

## 2019-05-20 DIAGNOSIS — E1122 Type 2 diabetes mellitus with diabetic chronic kidney disease: Secondary | ICD-10-CM | POA: Diagnosis not present

## 2019-05-20 DIAGNOSIS — I5032 Chronic diastolic (congestive) heart failure: Secondary | ICD-10-CM | POA: Diagnosis not present

## 2019-05-20 DIAGNOSIS — I130 Hypertensive heart and chronic kidney disease with heart failure and stage 1 through stage 4 chronic kidney disease, or unspecified chronic kidney disease: Secondary | ICD-10-CM

## 2019-05-20 DIAGNOSIS — Z794 Long term (current) use of insulin: Secondary | ICD-10-CM

## 2019-05-20 DIAGNOSIS — N184 Chronic kidney disease, stage 4 (severe): Secondary | ICD-10-CM | POA: Diagnosis not present

## 2019-05-20 NOTE — Chronic Care Management (AMB) (Signed)
Chronic Care Management    Social Work Follow Up Note  05/20/2019 Name: ADDIS BENNIE MRN: [id number] DOB: [birth year]  [name] is a 85 y.o. year old female who is a primary care patient of [name], MD. The CCM team was consulted for assistance with Level of Care Concerns.   Review of patient status, including review of consultants reports, other relevant assessments, and collaboration with appropriate care team members and the patient's provider was performed as part of comprehensive patient evaluation and provision of chronic care management services.     Goals Addressed            This Visit's Progress    "We want to learn about long term care options for placement"       Grand-daughter and daughter stated:  Current Barriers:   Financial constraints  Level of care concerns  Lacks knowledge of how to apply for long term care Medicaid  Clinical Social Work Clinical Goal(s):   Over the next 45 days, client will follow up with Social Services as directed by SW  Interventions:  Communication received from the patient primary care provider indicating the patient plans to return to [place] over the next 6 weeks and will need placement  Outbound call to the patients grand-daughter whom has been the patients primary caregiver for several months - the patients grand-daughter reports the patient returned to [facility] Thursday July 23rd.   Educated the patients grand daughter on long term care options for the patient including both assisted living and skilled nursing care  Assessed for patient interest in placement   Informed by [facility] the patient is currently staying with the patients daughter [name]) but this would not be a long term solution due to level of care concerns  Determined [name] and her mother [name] participated in a joint call to [facility] last week to learn about the program. Informed the patients daughter [name] is in process of  completing long term care Medicaid application  Communication with patients primary provider via in basket message to communicate patients current location and determine if the provider felt the patient would need ALF versus SNF  [place] would provide a list of facilities within [facility] for her to review once level of care is determined  Inbound call from patients daughter and current caregiver [name] who reports she has yet to initiate LTC Medicaid application  Encouraged [name] to begin application process as soon as possible  Determined the patient is covered under Medicaid MQB to assist with Medicare premiums  Obtained DSS caseworker contact information Rojelio Brenner [phone])  Riesa Pope that once the patients provider responds with level of care CCM SW would outreach Mrs. Holt to discuss next steps for placement  Inbound call from [facility] whom also had patients caseworker Mrs Helene Kelp on the [employer] with Mrs. Holt to discuss level of care concerns- Mrs. Helene Kelp has advised [name] to contact DSS via the main contact number [phone]) and request to speak with a LTC placement caseworker in order to initiate application process  Scheduled outbound call over the next week to assess progress of patient goal  Patient Self Care Activities:   Calls pharmacy for medication refills  Calls provider office for new concerns or questions`  Initial goal documentation         Follow Up Plan: SW will follow up with patient by phone over the next week.   [name], BSW, CDP Social Worker,  Certified Dementia Practitioner [place] [facility] [phone]  Total time spent performing care coordination and/or care management activities with the patient by phone or face to face = 42 minutes.

## 2019-05-20 NOTE — Patient Instructions (Signed)
Social Worker Visit Information  Goals we discussed today:  Goals Addressed            This Visit's Progress   . "We want to learn about long term care options for placement"       Grand-daughter and daughter stated:  Current Barriers:  . Financial constraints . Level of care concerns . Lacks knowledge of how to apply for long term care Medicaid  Clinical Social Work Clinical Goal(s):  [name] Over the next 45 days, client will follow up with Social Services as directed by SW  Interventions: . Communication received from the patient primary care provider indicating the patient plans to return to [facility] over the next 6 weeks and will need placement . Outbound call to the patients grand-daughter whom has been the patients primary caregiver for several months - the patients grand-daughter reports the patient returned to [facility] Thursday July 23rd.  . Educated the patients grand daughter on long term care options for the patient including both assisted living and skilled nursing care . Assessed for patient interest in placement  . Informed by [name] the patient is currently staying with the patients daughter [name]) but this would not be a long term solution due to level of care concerns . Determined [name] and her mother [name] participated in a joint call to [facility] last week to learn about the program. Informed the patients daughter [name] is in process of completing long term care Medicaid application . Communication with patients primary provider via in basket message to communicate patients current location and determine if the provider felt the patient would need ALF versus SNF . [place] would provide a list of facilities within [facility] for her to review once level of care is determined . Inbound call from patients daughter and current caregiver [name] who reports she has yet to initiate LTC Medicaid application . Encouraged [name] to  begin application process as soon as possible . Determined the patient is covered under Medicaid MQB to assist with Medicare premiums . Obtained DSS caseworker contact information Rojelio Brenner [phone]) . Riesa Pope that once the patients provider responds with level of care CCM SW would outreach Mrs. Holt to discuss next steps for placement . Inbound call from [facility] whom also had patients caseworker Mrs Helene Kelp on the line . Collaboration with Mrs. Holt to discuss level of care concerns- Mrs. Helene Kelp has advised [name] to contact DSS via the main contact number [phone]) and request to speak with a LTC placement caseworker in order to initiate application process . Scheduled outbound call over the next week to assess progress of patient goal  Patient Self Care Activities:  . Calls pharmacy for medication refills . Calls provider office for new concerns or questions`  Initial goal documentation         Materials Provided: Verbal education about long term care placement options provided by phone  Follow Up Plan: SW will follow up with patient by phone over the next week.   [name], BSW, CDP Social Worker, Certified Dementia Practitioner [place] [facility] [place] [facility] [phone]

## 2019-05-21 ENCOUNTER — Other Ambulatory Visit: Payer: Self-pay

## 2019-05-21 ENCOUNTER — Encounter: Payer: Self-pay | Admitting: Internal Medicine

## 2019-05-21 ENCOUNTER — Ambulatory Visit (INDEPENDENT_AMBULATORY_CARE_PROVIDER_SITE_OTHER): Payer: Medicare Other | Admitting: Internal Medicine

## 2019-05-21 VITALS — BP 160/82 | HR 65 | Temp 98.7°F | Ht 60.6 in | Wt 188.6 lb

## 2019-05-21 DIAGNOSIS — I10 Essential (primary) hypertension: Secondary | ICD-10-CM

## 2019-05-21 DIAGNOSIS — R300 Dysuria: Secondary | ICD-10-CM | POA: Diagnosis not present

## 2019-05-21 DIAGNOSIS — Z6836 Body mass index (BMI) 36.0-36.9, adult: Secondary | ICD-10-CM

## 2019-05-21 DIAGNOSIS — Z794 Long term (current) use of insulin: Secondary | ICD-10-CM

## 2019-05-21 DIAGNOSIS — N184 Chronic kidney disease, stage 4 (severe): Secondary | ICD-10-CM | POA: Diagnosis not present

## 2019-05-21 DIAGNOSIS — E1122 Type 2 diabetes mellitus with diabetic chronic kidney disease: Secondary | ICD-10-CM

## 2019-05-21 DIAGNOSIS — E2839 Other primary ovarian failure: Secondary | ICD-10-CM

## 2019-05-21 DIAGNOSIS — H04203 Unspecified epiphora, bilateral lacrimal glands: Secondary | ICD-10-CM

## 2019-05-21 LAB — POCT UA - MICROALBUMIN
Albumin/Creatinine Ratio, Urine, POC: >300
Creatinine, POC: 50 mg/dL
Microalbumin Ur, POC: 150 mg/L

## 2019-05-21 LAB — POCT URINALYSIS DIPSTICK
Bilirubin, UA: NEGATIVE
Glucose, UA: POSITIVE — AB
Ketones, UA: NEGATIVE
Nitrite, UA: NEGATIVE
Protein, UA: POSITIVE — AB
Spec Grav, UA: 1.02 (ref 1.010–1.025)
Urobilinogen, UA: 0.2 E.U./dL
pH, UA: 5.5 (ref 5.0–8.0)

## 2019-05-21 MED ORDER — DOXYCYCLINE HYCLATE 100 MG PO CAPS: 100.0000 mg | ORAL_CAPSULE | Freq: Two times a day (BID) | ORAL | 0 refills | Status: DC

## 2019-05-21 NOTE — Progress Notes (Signed)
Subjective:     Patient ID: [name] , female    DOB: [birth year] , 85 y.o.   MRN: [id number]   Chief Complaint  Patient presents with  . Urinary Tract Infection    HPI  She presents today for further evaluation of possible UTI. She c/o burning with urination and urinary frequency. She denies fever/chlls and flank pain.   Urinary Tract Infection  This is a new problem. The current episode started in the past 7 days. The problem occurs every urination. The problem has been unchanged. The quality of the pain is described as burning. The pain is at a severity of 4/10. The pain is moderate. There has been no fever. She is not sexually active. Pertinent negatives include no chills, hematuria or nausea. She has tried nothing for the symptoms.     Past Medical History:  Diagnosis Date  . Anemia   . Arthritis   . Cancer ([place])   . Cataract   . Chronic diastolic CHF (congestive heart failure) ([place])   . CKD (chronic kidney disease), stage III ([place])   . Clotting disorder ([place])   . Diabetes mellitus ([place])   . Dizziness and giddiness   . DVT (deep venous thrombosis) ([place])    a. On Coumadin for this.  . Essential hypertension   . LBBB (left bundle branch block)   . Mitral regurgitation    a. Mod MR by echo 2014.  . Muscular deconditioning   . Obesity   . SVT (supraventricular tachycardia) ([place])    a. In 2013 she had an EPS with ablation for SVT which did not eliminate the SVT completely. She also had bradycardia which limited medication. She was placed on amiodarone by Dr. Lovena Le.  . Thrombocytopenia ([place]) 11/21/2011     Family History  Problem Relation Age of Onset  . Stroke Mother   . Cancer Father        prostate  . Hypertension Daughter   . Heart attack Brother        x2  . Hypertension Brother   . Stroke Sister   . Hypertension Sister   . Breast cancer Maternal Aunt      Current Outpatient Medications:  .  acetaminophen (TYLENOL) 500 MG tablet, Take 1,000 mg by mouth  every 6 (six) hours as needed., Disp: , Rfl:  .  amiodarone (PACERONE) 200 MG tablet, Take 1 tablet (200 mg total) by mouth daily., Disp: 90 tablet, Rfl: 2 .  anastrozole (ARIMIDEX) 1 MG tablet, Take 1 tablet (1 mg total) by mouth daily., Disp: 90 tablet, Rfl: 3 .  apixaban (ELIQUIS) 5 MG TABS tablet, Take 1 tablet (5 mg total) by mouth 2 (two) times daily., Disp: 180 tablet, Rfl: 2 .  atorvastatin (LIPITOR) 20 MG tablet, Take 1 tablet (20 mg total) by mouth at bedtime., Disp: 90 tablet, Rfl: 2 .  BD PEN NEEDLE NANO U/F 32G X 4 MM MISC, Use as directed, Disp: 150 each, Rfl: 2 .  bumetanide (BUMEX) 1 MG tablet, Take 1 mg by mouth 2 (two) times daily. 1 and 1/2 tab in the am and 1 tab in the pm, Disp: , Rfl:  .  calcium carbonate (OS-CAL) 600 MG TABS, Take 600 mg by mouth daily with breakfast. , Disp: , Rfl:  .  carvedilol (COREG) 12.5 MG tablet, Take 1 tablet (12.5 mg total) by mouth 2 (two) times daily., Disp: 180 tablet, Rfl: 2 .  diclofenac sodium (VOLTAREN) 1 % GEL, Apply 2  g topically 4 (four) times daily., Disp: 100 g, Rfl: 1 .  doxycycline (VIBRAMYCIN) 100 MG capsule, Take 1 capsule (100 mg total) by mouth 2 (two) times daily., Disp: 14 capsule, Rfl: 0 .  glucose blood (ACCU-CHEK GUIDE) test strip, Use as instructed to check blood sugars 3 times per day prn dx: e11.22, Disp: 400 each, Rfl: 3 .  HUMALOG KWIKPEN 200 UNIT/ML SOPN, Inject 4-8 Units into the skin 3 (three) times daily. Per sliding scale, Disp: 15 pen, Rfl: 1 .  isosorbide-hydrALAZINE (BIDIL) 20-37.5 MG tablet, BiDil 20 mg-37.5 mg tablet  Take 1 tablet twice a day by oral route., Disp: , Rfl:  .  LEVEMIR FLEXTOUCH 100 UNIT/ML Pen, Inject 10-16 Units into the skin daily. 16 units in the am and 10 units in the evening, Disp: 15 mL, Rfl: 2 .  magnesium oxide (MAG-OX) 400 MG tablet, Take 400 mg by mouth daily., Disp: , Rfl:  .  omeprazole (PRILOSEC) 40 MG capsule, TAKE ONE CAPSULE BY MOUTH DAILY BEFORE A MEAL, Disp: 90 capsule, Rfl: 1 .   oxybutynin (DITROPAN) 5 MG tablet, Take 1 tablet (5 mg total) by mouth 2 (two) times daily., Disp: 180 tablet, Rfl: 1 .  potassium chloride SA (K-DUR) 20 MEQ tablet, Take 2 tablets (40 mEq total) by mouth daily., Disp: 180 tablet, Rfl: 1 .  pregabalin (LYRICA) 75 MG capsule, Take 1 capsule (75 mg total) by mouth at bedtime., Disp: 90 capsule, Rfl: 2 .  traMADol (ULTRAM) 50 MG tablet, Take 50 mg by mouth 2 (two) times daily as needed for pain., Disp: , Rfl:    Allergies  Allergen Reactions  . Aspirin Itching  . Penicillins Itching and Rash    DID THE REACTION INVOLVE: Swelling of the face/tongue/throat, SOB, or low BP? y Sudden or severe rash/hives, skin peeling, or the inside of the mouth or nose? n Did it require medical treatment? yes When did it last happen?more than 10 years If all above answers are "NO", may proceed with cephalosporin use.      Review of Systems  Constitutional: Negative.  Negative for chills.  Respiratory: Negative.   Cardiovascular: Negative.   Gastrointestinal: Negative.  Negative for nausea.  Genitourinary: Negative for hematuria.  Neurological: Negative.   Psychiatric/Behavioral: Negative.      Today's Vitals   05/21/19 1443  BP: (!) 160/82  Pulse: 65  Temp: 98.7 F (37.1 C)  TempSrc: Oral  Weight: 188 lb 9.6 oz (85.5 kg)  Height: 5' 0.6" (1.539 m)   Body mass index is 36.11 kg/m.   Objective:  Physical Exam Vitals signs and nursing note reviewed.  Constitutional:      Appearance: Normal appearance.  HENT:     Head: Normocephalic and atraumatic.  Cardiovascular:     Rate and Rhythm: Normal rate and regular rhythm.     Heart sounds: Normal heart sounds.  Pulmonary:     Effort: Pulmonary effort is normal.     Breath sounds: Normal breath sounds.  Skin:    General: Skin is warm.  Neurological:     General: No focal deficit present.     Mental Status: She is alert.  Psychiatric:        Mood and Affect: Mood normal.         Behavior: Behavior normal.         Assessment And Plan:     1. Burning with urination  I will check urinalysis.   If positive, I will send for  urine culture.   - POCT Urinalysis Dipstick (81002) - Culture, Urine; Future - Culture, Urine  2. Type 2 diabetes mellitus with stage 4 chronic kidney disease, with long-term current use of insulin ([place])  I will refer her for diabetic eye exam as requested.   - Ambulatory referral to Ophthalmology - POCT UA - Microalbumin  3. 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 I defer treatment to ophthalmology.   4. Class 2 severe obesity due to excess calories with serious comorbidity and body mass index (BMI) of 36.0 to 36.9 in adult [facility])  She is encouraged to strive to lose ten pounds to decrease her cardiac risk.   5. Estrogen deficiency  - DG Bone Density; Future        Maximino Greenland, MD    THE PATIENT IS ENCOURAGED TO PRACTICE SOCIAL DISTANCING DUE TO THE COVID-19 PANDEMIC.

## 2019-05-22 ENCOUNTER — Encounter: Payer: Self-pay | Admitting: Internal Medicine

## 2019-05-23 ENCOUNTER — Ambulatory Visit: Payer: Self-pay

## 2019-05-23 DIAGNOSIS — I5032 Chronic diastolic (congestive) heart failure: Secondary | ICD-10-CM

## 2019-05-23 DIAGNOSIS — N184 Chronic kidney disease, stage 4 (severe): Secondary | ICD-10-CM

## 2019-05-23 DIAGNOSIS — E1122 Type 2 diabetes mellitus with diabetic chronic kidney disease: Secondary | ICD-10-CM

## 2019-05-23 DIAGNOSIS — I130 Hypertensive heart and chronic kidney disease with heart failure and stage 1 through stage 4 chronic kidney disease, or unspecified chronic kidney disease: Secondary | ICD-10-CM

## 2019-05-23 NOTE — Patient Instructions (Signed)
Social Worker Visit Information  Goals we discussed today:  Goals Addressed            This Visit's Progress   . "We want to learn about long term care options for placement"       Grand-daughter and daughter stated:  Current Barriers:  . Financial constraints . Level of care concerns . Lacks knowledge of how to apply for long term care Medicaid  Clinical Social Work Clinical Goal(s):  [name] Over the next 45 days, client will follow up with Social Services as directed by SW  Interventions: Completed 05/23/2019 . Outbound call to the patients daughter [name] to assess progression of patient stated goal . Determined [name] has yet to initiate a long term care Medicaid application . Informed by [name] she had spoke with her mother who is agreeable to placement . Performed chart review to reveal the patients primary care provider has not yet identified whether the patient was appropriate for SNF or ALF placement . Sent the provider an in basket message requesting guidance on level of care needed for the patient in order to identify next steps with Medicaid . Assessed patients receipt of resources from [facility] - daughter denies receipt . Provided the patients daughter contact number to [facility] at her request to follow up on previous outreach call  Patient Self Care Activities:  . Calls pharmacy for medication refills . Calls provider office for new concerns or questions`  Please see past updates related to this goal by clicking on the "Past Updates" button in the selected goal            Follow Up Plan: SW will follow up with patient by phone over the next week  [name], BSW, CDP Social Worker, Certified Dementia Practitioner [place] [facility] [place] [facility] [phone]

## 2019-05-23 NOTE — Chronic Care Management (AMB) (Signed)
  Chronic Care Management   Social Work Follow Up Note  05/23/2019 Name: [name] MRN: [id number] DOB: January 10, 1933  [name] is a 85 y.o. year old female who is a primary care patient of Stephanie Chard, MD. The CCM team was consulted for assistance with Level of Care Concerns.   Review of patient status, including review of consultants reports, other relevant assessments, and collaboration with appropriate care team members and the patient's provider was performed as part of comprehensive patient evaluation and provision of chronic care management services.     Goals Addressed            This Visit's Progress   . "We want to learn about long term care options for placement"       Grand-daughter and daughter stated:  Current Barriers:  . Financial constraints . Level of care concerns . Lacks knowledge of how to apply for long term care Medicaid  Clinical Social Work Clinical Goal(s):  [name] Over the next 45 days, client will follow up with Social Services as directed by SW  Interventions: Completed 05/23/2019 . Outbound call to the patients daughter [name] to assess progression of patient stated goal . Determined [name] has yet to initiate a long term care Medicaid application . Informed by [name] she had spoke with her mother who is agreeable to placement . Performed chart review to reveal the patients primary care provider has not yet identified whether the patient was appropriate for SNF or ALF placement . Sent the provider an in basket message requesting guidance on level of care needed for the patient in order to identify next steps with Medicaid . Assessed patients receipt of resources from [facility] - daughter denies receipt . Provided the patients daughter contact number to [facility] at her request to follow up on previous outreach call  Patient Self Care Activities:  . Calls pharmacy for medication refills . Calls provider office for new  concerns or questions`  Please see past updates related to this goal by clicking on the "Past Updates" button in the selected goal          Follow Up Plan: SW will follow up with patient by phone over the next week  [name], BSW, CDP Social Worker, Certified Dementia Practitioner [place] [facility] [place] [facility] [phone]  Total time spent performing care coordination and/or care management activities with the patient by phone or face to face = 10 minutes.

## 2019-05-24 ENCOUNTER — Encounter: Payer: Self-pay | Admitting: Internal Medicine

## 2019-05-24 LAB — URINE CULTURE

## 2019-05-28 ENCOUNTER — Ambulatory Visit: Payer: Self-pay

## 2019-05-28 DIAGNOSIS — E1122 Type 2 diabetes mellitus with diabetic chronic kidney disease: Secondary | ICD-10-CM

## 2019-05-28 DIAGNOSIS — Z794 Long term (current) use of insulin: Secondary | ICD-10-CM

## 2019-05-28 DIAGNOSIS — I5032 Chronic diastolic (congestive) heart failure: Secondary | ICD-10-CM

## 2019-05-28 NOTE — Patient Instructions (Signed)
Social Worker Visit Information  Goals we discussed today:  Goals Addressed            This Visit's Progress   . "We want to learn about long term care options for placement"   Not on track    Grand-daughter and daughter stated:  Current Barriers:  . Financial constraints . Level of care concerns . Lacks knowledge of how to apply for long term care Medicaid  Clinical Social Work Clinical Goal(s):  [name] Over the next 45 days, client will follow up with Social Services as directed by SW  Interventions: Completed 05/28/2019 . Outbound call to the patients daughter [name] to assess progression of patient stated goal . Communicated to [place] the patients primary care provider felt ALF was an appropriate level of care for the patient . Discussed barriers for placement including COVID Pandemic as well as patients ability to afford placement . Determined the patient would like to move into a [facility] on [address] in [place] which is located close to her daughters home . Outbound call to [place] [facility] to determine admission process.  o Nanine Means is admitting patients from home and requiring a 14 day quarantine prior to mingling within the community. At this time there are no Medicaid beds available. Private pay room rate for a companion room would be $2,095 plus level of care . Outbound call to the patients daughter [name] to provide the above information . Riesa Pope that even if the patient is approved for Medicaid she may have difficulty finding an bed offer due to many communities having a set number of Medicaid beds they are licensed for . Mailed [name] a list of adults care homes obtained from [facility] within [facility] . Collaboration with the patients primary provider to inform of the patients interest in ALF placement and needed FL-2 in the coming days to proceed with bed offer . Encouraged [name] to apply for Medicaid on behalf  of the patient while researching placement options . Assessed for continued interest in [facility] . Determined the patient has yet to contact [facility] to discuss enrollment options . Advised [name] [facility] only enrolls at the first of the month and to contact as soon as possible if this is an option she and the patient would like to explore  Patient Self Care Activities:  . Calls pharmacy for medication refills . Calls provider office for new concerns or questions`  Please see past updates related to this goal by clicking on the "Past Updates" button in the selected goal          Materials Provided: Yes: Mailed a list of adult care homes in [place]: SW will follow up with patient by phone over the next week.   [name], BSW, CDP Social Worker, Certified Dementia Practitioner [place] [facility] [place] [facility] [phone]

## 2019-05-28 NOTE — Chronic Care Management (AMB) (Signed)
Chronic Care Management    Social Work Follow Up Note  05/28/2019 Name: JOANI COSMA MRN: [id number] DOB: [birth year]  [name] is a 85 y.o. year old female who is a primary care patient of [name], MD. The CCM team was consulted for assistance with Level of Care Concerns.   Review of patient status, including review of consultants reports, other relevant assessments, and collaboration with appropriate care team members and the patient's provider was performed as part of comprehensive patient evaluation and provision of chronic care management services.     Goals Addressed            This Visit's Progress   . "We want to learn about long term care options for placement"   Not on track    Grand-daughter and daughter stated:  Current Barriers:  . Financial constraints . Level of care concerns . Lacks knowledge of how to apply for long term care Medicaid  Clinical Social Work Clinical Goal(s):  [name] Over the next 45 days, client will follow up with Social Services as directed by SW  Interventions: Completed 05/28/2019 . Outbound call to the patients daughter [name] to assess progression of patient stated goal . Communicated to [place] the patients primary care provider felt ALF was an appropriate level of care for the patient . Discussed barriers for placement including COVID Pandemic as well as patients ability to afford placement . Determined the patient would like to move into a [facility] community on [address] in [place] which is located close to her daughters home . Outbound call to [place] [facility] to determine admission process.  o Nanine Means is admitting patients from home and requiring a 14 day quarantine prior to mingling within the community. At this time there are no Medicaid beds available. Private pay room rate for a companion room would be $2,095 plus level of care . Outbound call to the patients daughter [name] to provide the above  information . Riesa Pope that even if the patient is approved for Medicaid she may have difficulty finding an bed offer due to many communities having a set number of Medicaid beds they are licensed for . Mailed [name] a list of adults care homes obtained from [facility] within [facility] . Collaboration with the patients primary provider to inform of the patients interest in ALF placement and needed FL-2 in the coming days to proceed with bed offer . Encouraged [name] to apply for Medicaid on behalf of the patient while researching placement options . Assessed for continued interest in [facility] . Determined the patient has yet to contact [facility] to discuss enrollment options . Advised [name] [facility] only enrolls at the first of the month and to contact as soon as possible if this is an option she and the patient would like to explore  Patient Self Care Activities:  . Calls pharmacy for medication refills . Calls provider office for new concerns or questions`  Please see past updates related to this goal by clicking on the "Past Updates" button in the selected goal          Follow Up Plan: SW will follow up with patient by phone over the next week,  [name], BSW, CDP Social Worker, Certified Dementia Practitioner [place] [facility] [place] [facility] [phone]  Total time spent performing care coordination and/or care management activities with the patient by phone or face to face = 23 minutes.

## 2019-05-29 ENCOUNTER — Other Ambulatory Visit: Payer: Self-pay

## 2019-05-29 MED ORDER — LEVEMIR FLEXTOUCH 100 UNIT/ML ~~LOC~~ SOPN: 10.0000 [IU] | PEN_INJECTOR | Freq: Every day | SUBCUTANEOUS | 2 refills | Status: DC

## 2019-06-03 ENCOUNTER — Other Ambulatory Visit: Payer: Self-pay

## 2019-06-03 MED ORDER — BUMETANIDE 1 MG PO TABS: 1.0000 mg | ORAL_TABLET | Freq: Two times a day (BID) | ORAL | 0 refills | Status: DC

## 2019-06-04 ENCOUNTER — Telehealth: Payer: Self-pay

## 2019-06-04 ENCOUNTER — Encounter: Payer: Self-pay | Admitting: Internal Medicine

## 2019-06-04 ENCOUNTER — Ambulatory Visit: Payer: Self-pay

## 2019-06-04 DIAGNOSIS — I5032 Chronic diastolic (congestive) heart failure: Secondary | ICD-10-CM

## 2019-06-04 DIAGNOSIS — I130 Hypertensive heart and chronic kidney disease with heart failure and stage 1 through stage 4 chronic kidney disease, or unspecified chronic kidney disease: Secondary | ICD-10-CM

## 2019-06-04 DIAGNOSIS — E1122 Type 2 diabetes mellitus with diabetic chronic kidney disease: Secondary | ICD-10-CM

## 2019-06-04 NOTE — Chronic Care Management (AMB) (Signed)
  Chronic Care Management   Note  06/04/2019 Name: SHANQUITA RONNING MRN: [id number] DOB: Oct 24, 1933    Chronic Care Management   Outreach Note  06/04/2019 Name: MARIYANA FULOP MRN: [id number] DOB: 1933/01/27  Referred by: [name], MD Reason for referral : Chronic Care Management (CCM RNCM Telephone Follow up)   An unsuccessful telephone outreach was attempted today to Ms. Blasko's daughter and caregiver [name]. The patient was referred to the case management team by [name] MD for assistance with chronic care management and care coordination.     Follow Up Plan: Telephone follow up appointment with care management team member scheduled for: 07/03/19   [name], RN, BSN, CCM Care Management Coordinator [place] [facility]  Direct Phone: [phone]

## 2019-06-06 ENCOUNTER — Telehealth: Payer: Self-pay

## 2019-06-08 ENCOUNTER — Emergency Department (HOSPITAL_COMMUNITY): Payer: Medicare Other

## 2019-06-08 ENCOUNTER — Other Ambulatory Visit: Payer: Self-pay

## 2019-06-08 ENCOUNTER — Encounter (HOSPITAL_COMMUNITY): Payer: Self-pay

## 2019-06-08 ENCOUNTER — Inpatient Hospital Stay (HOSPITAL_COMMUNITY)
Admission: EM | Admit: 2019-06-08 | Discharge: 2019-06-11 | DRG: 291 | Disposition: A | Discharge status: Home Health | Payer: Medicare Other | Attending: Internal Medicine | Admitting: Internal Medicine

## 2019-06-08 DIAGNOSIS — Z7901 Long term (current) use of anticoagulants: Secondary | ICD-10-CM | POA: Diagnosis not present

## 2019-06-08 DIAGNOSIS — Z6835 Body mass index (BMI) 35.0-35.9, adult: Secondary | ICD-10-CM

## 2019-06-08 DIAGNOSIS — C50919 Malignant neoplasm of unspecified site of unspecified female breast: Secondary | ICD-10-CM | POA: Diagnosis present

## 2019-06-08 DIAGNOSIS — D631 Anemia in chronic kidney disease: Secondary | ICD-10-CM | POA: Diagnosis present

## 2019-06-08 DIAGNOSIS — E1122 Type 2 diabetes mellitus with diabetic chronic kidney disease: Secondary | ICD-10-CM | POA: Diagnosis not present

## 2019-06-08 DIAGNOSIS — I081 Rheumatic disorders of both mitral and tricuspid valves: Secondary | ICD-10-CM | POA: Diagnosis present

## 2019-06-08 DIAGNOSIS — D696 Thrombocytopenia, unspecified: Secondary | ICD-10-CM | POA: Diagnosis not present

## 2019-06-08 DIAGNOSIS — K224 Dyskinesia of esophagus: Secondary | ICD-10-CM | POA: Diagnosis not present

## 2019-06-08 DIAGNOSIS — I272 Pulmonary hypertension, unspecified: Secondary | ICD-10-CM | POA: Diagnosis not present

## 2019-06-08 DIAGNOSIS — I509 Heart failure, unspecified: Secondary | ICD-10-CM

## 2019-06-08 DIAGNOSIS — K449 Diaphragmatic hernia without obstruction or gangrene: Secondary | ICD-10-CM | POA: Diagnosis not present

## 2019-06-08 DIAGNOSIS — M5412 Radiculopathy, cervical region: Secondary | ICD-10-CM | POA: Diagnosis not present

## 2019-06-08 DIAGNOSIS — I10 Essential (primary) hypertension: Secondary | ICD-10-CM | POA: Diagnosis present

## 2019-06-08 DIAGNOSIS — Z79899 Other long term (current) drug therapy: Secondary | ICD-10-CM

## 2019-06-08 DIAGNOSIS — I361 Nonrheumatic tricuspid (valve) insufficiency: Secondary | ICD-10-CM | POA: Diagnosis not present

## 2019-06-08 DIAGNOSIS — Z86718 Personal history of other venous thrombosis and embolism: Secondary | ICD-10-CM | POA: Diagnosis not present

## 2019-06-08 DIAGNOSIS — M19012 Primary osteoarthritis, left shoulder: Secondary | ICD-10-CM | POA: Diagnosis not present

## 2019-06-08 DIAGNOSIS — Z794 Long term (current) use of insulin: Secondary | ICD-10-CM

## 2019-06-08 DIAGNOSIS — Z950 Presence of cardiac pacemaker: Secondary | ICD-10-CM

## 2019-06-08 DIAGNOSIS — Z823 Family history of stroke: Secondary | ICD-10-CM

## 2019-06-08 DIAGNOSIS — I5033 Acute on chronic diastolic (congestive) heart failure: Secondary | ICD-10-CM | POA: Diagnosis not present

## 2019-06-08 DIAGNOSIS — I340 Nonrheumatic mitral (valve) insufficiency: Secondary | ICD-10-CM | POA: Diagnosis not present

## 2019-06-08 DIAGNOSIS — D638 Anemia in other chronic diseases classified elsewhere: Secondary | ICD-10-CM | POA: Diagnosis present

## 2019-06-08 DIAGNOSIS — E669 Obesity, unspecified: Secondary | ICD-10-CM | POA: Diagnosis present

## 2019-06-08 DIAGNOSIS — Z880 Allergy status to penicillin: Secondary | ICD-10-CM

## 2019-06-08 DIAGNOSIS — N183 Chronic kidney disease, stage 3 unspecified: Secondary | ICD-10-CM | POA: Diagnosis present

## 2019-06-08 DIAGNOSIS — I150 Renovascular hypertension: Secondary | ICD-10-CM

## 2019-06-08 DIAGNOSIS — I5041 Acute combined systolic (congestive) and diastolic (congestive) heart failure: Secondary | ICD-10-CM | POA: Diagnosis not present

## 2019-06-08 DIAGNOSIS — E1169 Type 2 diabetes mellitus with other specified complication: Secondary | ICD-10-CM

## 2019-06-08 DIAGNOSIS — I130 Hypertensive heart and chronic kidney disease with heart failure and stage 1 through stage 4 chronic kidney disease, or unspecified chronic kidney disease: Secondary | ICD-10-CM | POA: Diagnosis not present

## 2019-06-08 DIAGNOSIS — Z20828 Contact with and (suspected) exposure to other viral communicable diseases: Secondary | ICD-10-CM | POA: Diagnosis present

## 2019-06-08 DIAGNOSIS — N1832 Chronic kidney disease, stage 3b: Secondary | ICD-10-CM | POA: Diagnosis present

## 2019-06-08 DIAGNOSIS — N184 Chronic kidney disease, stage 4 (severe): Secondary | ICD-10-CM | POA: Diagnosis not present

## 2019-06-08 DIAGNOSIS — I69351 Hemiplegia and hemiparesis following cerebral infarction affecting right dominant side: Secondary | ICD-10-CM | POA: Diagnosis not present

## 2019-06-08 DIAGNOSIS — N179 Acute kidney failure, unspecified: Secondary | ICD-10-CM | POA: Diagnosis not present

## 2019-06-08 DIAGNOSIS — E785 Hyperlipidemia, unspecified: Secondary | ICD-10-CM | POA: Diagnosis not present

## 2019-06-08 DIAGNOSIS — I447 Left bundle-branch block, unspecified: Secondary | ICD-10-CM | POA: Diagnosis present

## 2019-06-08 DIAGNOSIS — I517 Cardiomegaly: Secondary | ICD-10-CM | POA: Diagnosis not present

## 2019-06-08 DIAGNOSIS — I110 Hypertensive heart disease with heart failure: Secondary | ICD-10-CM | POA: Diagnosis not present

## 2019-06-08 DIAGNOSIS — M25512 Pain in left shoulder: Secondary | ICD-10-CM | POA: Diagnosis not present

## 2019-06-08 DIAGNOSIS — M542 Cervicalgia: Secondary | ICD-10-CM | POA: Diagnosis not present

## 2019-06-08 DIAGNOSIS — E119 Type 2 diabetes mellitus without complications: Secondary | ICD-10-CM

## 2019-06-08 DIAGNOSIS — G629 Polyneuropathy, unspecified: Secondary | ICD-10-CM | POA: Diagnosis not present

## 2019-06-08 DIAGNOSIS — J810 Acute pulmonary edema: Secondary | ICD-10-CM | POA: Diagnosis not present

## 2019-06-08 DIAGNOSIS — Z803 Family history of malignant neoplasm of breast: Secondary | ICD-10-CM

## 2019-06-08 DIAGNOSIS — M79603 Pain in arm, unspecified: Secondary | ICD-10-CM | POA: Diagnosis present

## 2019-06-08 DIAGNOSIS — Z9114 Patient's other noncompliance with medication regimen: Secondary | ICD-10-CM

## 2019-06-08 DIAGNOSIS — R1310 Dysphagia, unspecified: Secondary | ICD-10-CM

## 2019-06-08 DIAGNOSIS — K219 Gastro-esophageal reflux disease without esophagitis: Secondary | ICD-10-CM | POA: Diagnosis not present

## 2019-06-08 DIAGNOSIS — Z45018 Encounter for adjustment and management of other part of cardiac pacemaker: Secondary | ICD-10-CM | POA: Diagnosis not present

## 2019-06-08 DIAGNOSIS — Z79891 Long term (current) use of opiate analgesic: Secondary | ICD-10-CM

## 2019-06-08 DIAGNOSIS — Z8249 Family history of ischemic heart disease and other diseases of the circulatory system: Secondary | ICD-10-CM

## 2019-06-08 HISTORY — DX: Cerebral infarction, unspecified: I63.9

## 2019-06-08 LAB — CBC WITH DIFFERENTIAL/PLATELET
Abs Immature Granulocytes: 0 10*3/uL (ref 0.00–0.07)
Basophils Absolute: 0 10*3/uL (ref 0.0–0.1)
Basophils Relative: 1 %
Eosinophils Absolute: 0.2 10*3/uL (ref 0.0–0.5)
Eosinophils Relative: 6 %
HCT: 33.5 % — ABNORMAL LOW (ref 36.0–46.0)
Hemoglobin: 10.5 g/dL — ABNORMAL LOW (ref 12.0–15.0)
Immature Granulocytes: 0 %
Lymphocytes Relative: 42 %
Lymphs Abs: 1.2 10*3/uL (ref 0.7–4.0)
MCH: 30.3 pg (ref 26.0–34.0)
MCHC: 31.3 g/dL (ref 30.0–36.0)
MCV: 96.5 fL (ref 80.0–100.0)
Monocytes Absolute: 0.3 10*3/uL (ref 0.1–1.0)
Monocytes Relative: 11 %
Neutro Abs: 1.2 10*3/uL — ABNORMAL LOW (ref 1.7–7.7)
Neutrophils Relative %: 40 %
Platelets: 118 10*3/uL — ABNORMAL LOW (ref 150–400)
RBC: 3.47 MIL/uL — ABNORMAL LOW (ref 3.87–5.11)
RDW: 14.3 % (ref 11.5–15.5)
WBC: 3 10*3/uL — ABNORMAL LOW (ref 4.0–10.5)
nRBC: 0 % (ref 0.0–0.2)

## 2019-06-08 LAB — COMPREHENSIVE METABOLIC PANEL
ALT: 30 U/L (ref 0–44)
AST: 28 U/L (ref 15–41)
Albumin: 3.8 g/dL (ref 3.5–5.0)
Alkaline Phosphatase: 95 U/L (ref 38–126)
Anion gap: 10 (ref 5–15)
BUN: 28 mg/dL — ABNORMAL HIGH (ref 8–23)
CO2: 26 mmol/L (ref 22–32)
Calcium: 8.8 mg/dL — ABNORMAL LOW (ref 8.9–10.3)
Chloride: 103 mmol/L (ref 98–111)
Creatinine, Ser: 1.43 mg/dL — ABNORMAL HIGH (ref 0.44–1.00)
GFR calc Af Amer: 39 mL/min — ABNORMAL LOW (ref >60)
GFR calc non Af Amer: 34 mL/min — ABNORMAL LOW (ref >60)
Glucose, Bld: 87 mg/dL (ref 70–99)
Potassium: 4.2 mmol/L (ref 3.5–5.1)
Sodium: 139 mmol/L (ref 135–145)
Total Bilirubin: 0.6 mg/dL (ref 0.3–1.2)
Total Protein: 8.1 g/dL (ref 6.5–8.1)

## 2019-06-08 LAB — HEMOGLOBIN A1C
Hgb A1c MFr Bld: 7.9 % — ABNORMAL HIGH (ref 4.8–5.6)
Mean Plasma Glucose: 180.03 mg/dL

## 2019-06-08 LAB — TROPONIN I (HIGH SENSITIVITY)
Troponin I (High Sensitivity): 58 ng/L — ABNORMAL HIGH (ref <18)
Troponin I (High Sensitivity): 111 ng/L (ref <18)

## 2019-06-08 LAB — BRAIN NATRIURETIC PEPTIDE: B Natriuretic Peptide: 557.6 pg/mL — ABNORMAL HIGH (ref 0.0–100.0)

## 2019-06-08 LAB — CBG MONITORING, ED: Glucose-Capillary: 79 mg/dL (ref 70–99)

## 2019-06-08 LAB — SARS CORONAVIRUS 2 BY RT PCR (HOSPITAL ORDER, PERFORMED IN ~~LOC~~ HOSPITAL LAB): SARS Coronavirus 2: NEGATIVE

## 2019-06-08 MED ORDER — FUROSEMIDE 10 MG/ML IJ SOLN
INTRAMUSCULAR | Status: AC
  Filled 2019-06-08: qty 4

## 2019-06-08 MED ORDER — OXYBUTYNIN CHLORIDE 5 MG PO TABS
5.0000 mg | ORAL_TABLET | Freq: Two times a day (BID) | ORAL | Status: DC
  Administered 2019-06-09 – 2019-06-11 (×6): 5 mg via ORAL
  Filled 2019-06-08 (×6): qty 1

## 2019-06-08 MED ORDER — FUROSEMIDE 10 MG/ML IJ SOLN
40.0000 mg | Freq: Once | INTRAMUSCULAR | Status: AC
  Administered 2019-06-08: 40 mg via INTRAVENOUS
  Filled 2019-06-08: qty 4

## 2019-06-08 MED ORDER — AMIODARONE HCL 200 MG PO TABS
200.0000 mg | ORAL_TABLET | Freq: Every day | ORAL | Status: DC
  Administered 2019-06-09 – 2019-06-11 (×3): 200 mg via ORAL
  Filled 2019-06-08 (×3): qty 1

## 2019-06-08 MED ORDER — HYDROCODONE-ACETAMINOPHEN 5-325 MG PO TABS
1.0000 | ORAL_TABLET | Freq: Once | ORAL | Status: AC
  Administered 2019-06-08: 1 via ORAL
  Filled 2019-06-08: qty 1

## 2019-06-08 MED ORDER — ALBUTEROL SULFATE HFA 108 (90 BASE) MCG/ACT IN AERS
2.0000 | INHALATION_SPRAY | Freq: Once | RESPIRATORY_TRACT | Status: AC
  Administered 2019-06-08: 2 via RESPIRATORY_TRACT
  Filled 2019-06-08: qty 6.7

## 2019-06-08 MED ORDER — FUROSEMIDE 10 MG/ML IJ SOLN
40.0000 mg | Freq: Four times a day (QID) | INTRAMUSCULAR | Status: AC
  Administered 2019-06-09 (×3): 40 mg via INTRAVENOUS
  Filled 2019-06-08 (×3): qty 4

## 2019-06-08 MED ORDER — INSULIN ASPART 100 UNIT/ML ~~LOC~~ SOLN
0.0000 [IU] | Freq: Three times a day (TID) | SUBCUTANEOUS | Status: DC
  Administered 2019-06-09: 4 [IU] via SUBCUTANEOUS
  Administered 2019-06-09: 11 [IU] via SUBCUTANEOUS
  Administered 2019-06-10 – 2019-06-11 (×4): 4 [IU] via SUBCUTANEOUS

## 2019-06-08 MED ORDER — INSULIN DETEMIR 100 UNIT/ML FLEXPEN: 10.0000 [IU] | PEN_INJECTOR | Freq: Every day | SUBCUTANEOUS | Status: DC

## 2019-06-08 MED ORDER — PANTOPRAZOLE SODIUM 40 MG PO TBEC
40.0000 mg | DELAYED_RELEASE_TABLET | Freq: Every day | ORAL | Status: DC
  Administered 2019-06-09 – 2019-06-11 (×3): 40 mg via ORAL
  Filled 2019-06-08 (×3): qty 1

## 2019-06-08 MED ORDER — APIXABAN 5 MG PO TABS
5.0000 mg | ORAL_TABLET | Freq: Two times a day (BID) | ORAL | Status: DC
  Administered 2019-06-09 – 2019-06-11 (×6): 5 mg via ORAL
  Filled 2019-06-08 (×6): qty 1

## 2019-06-08 MED ORDER — ISOSORB DINITRATE-HYDRALAZINE 20-37.5 MG PO TABS: 1.0000 | ORAL_TABLET | Freq: Three times a day (TID) | ORAL | Status: DC

## 2019-06-08 MED ORDER — NITROGLYCERIN IN D5W 200-5 MCG/ML-% IV SOLN
0.0000 ug/min | INTRAVENOUS | Status: DC
  Administered 2019-06-08: 4 ug/min via INTRAVENOUS
  Filled 2019-06-08: qty 250

## 2019-06-08 MED ORDER — CARVEDILOL 25 MG PO TABS
25.0000 mg | ORAL_TABLET | Freq: Two times a day (BID) | ORAL | Status: DC
  Administered 2019-06-09 – 2019-06-11 (×4): 25 mg via ORAL
  Filled 2019-06-08 (×6): qty 1

## 2019-06-08 MED ORDER — PREGABALIN 50 MG PO CAPS
75.0000 mg | ORAL_CAPSULE | Freq: Every day | ORAL | Status: DC
  Administered 2019-06-09 – 2019-06-10 (×3): 75 mg via ORAL
  Filled 2019-06-08 (×3): qty 1

## 2019-06-08 MED ORDER — ONDANSETRON HCL 4 MG/2ML IJ SOLN: 4.0000 mg | Freq: Four times a day (QID) | INTRAMUSCULAR | Status: DC | PRN

## 2019-06-08 MED ORDER — ACETAMINOPHEN 325 MG PO TABS
650.0000 mg | ORAL_TABLET | ORAL | Status: DC | PRN
  Administered 2019-06-09 – 2019-06-11 (×3): 650 mg via ORAL
  Filled 2019-06-08 (×3): qty 2

## 2019-06-08 MED ORDER — MAGNESIUM OXIDE 400 (241.3 MG) MG PO TABS
400.0000 mg | ORAL_TABLET | Freq: Every day | ORAL | Status: DC
  Administered 2019-06-09 – 2019-06-11 (×3): 400 mg via ORAL
  Filled 2019-06-08 (×3): qty 1

## 2019-06-08 MED ORDER — LISINOPRIL 2.5 MG PO TABS
2.5000 mg | ORAL_TABLET | Freq: Every day | ORAL | Status: DC
  Administered 2019-06-09 – 2019-06-11 (×3): 2.5 mg via ORAL
  Filled 2019-06-08 (×3): qty 1

## 2019-06-08 MED ORDER — ATORVASTATIN CALCIUM 20 MG PO TABS
20.0000 mg | ORAL_TABLET | Freq: Every day | ORAL | Status: DC
  Administered 2019-06-09 – 2019-06-10 (×3): 20 mg via ORAL
  Filled 2019-06-08 (×3): qty 1

## 2019-06-08 MED ORDER — ANASTROZOLE 1 MG PO TABS
1.0000 mg | ORAL_TABLET | Freq: Every day | ORAL | Status: DC
  Administered 2019-06-09 – 2019-06-11 (×3): 1 mg via ORAL
  Filled 2019-06-08 (×4): qty 1

## 2019-06-08 MED ORDER — CARVEDILOL 12.5 MG PO TABS: 12.5000 mg | ORAL_TABLET | Freq: Two times a day (BID) | ORAL | Status: DC

## 2019-06-08 NOTE — ED Triage Notes (Signed)
Pt states she is having left arm pain, running from her shoulder down to her wrist. Denies injury/trauma

## 2019-06-08 NOTE — ED Notes (Signed)
Date and time results received: 06/08/19 10:07 PM  (use smartphrase ".now" to insert current time)  Test: troponin Critical Value: 111  Name of Provider Notified: Dr Veverly Fells  Orders Received? Or Actions Taken?:

## 2019-06-08 NOTE — H&P (Signed)
History and Physical    [name] IPJ:[id number] DOB: 1933-01-28 DOA: 06/08/2019  PCP: [name], MD (Confirm with patient/family/NH records and if not entered, this has to be entered at [facility] point of entry) Patient coming from: Patient is coming from home  I have personally briefly reviewed patient's old medical records in [place]  Chief Complaint: Acute onset shortness of breath  HPI: [name] is a 85 y.o. female with medical history significant of chronic diastolic heart failure, diabetes, hypertension, history of CVA, DVT, CKD 4.  Patient recently returned to [place] from [place].  Been followed medically in [place].  Patient missed several days of medication in the moving process and just started on August 14 back to her regular medications.  Patient recently had a pacemaker placed.  For the past 3 days she reports she has been having pain in her left shoulder and arm and pain radiating to her back. She report this has been a steady pain that is mild- moderate in nature w/o SOB, N/V.  She denies any chest pressure or chest discomfort.  Because of her arm pain she presented to the emergency department for evaluation.  While in the emergency department the patient became acutely short of breath.  Examination was consistent with pulmonary edema.  Chest x-ray was also consistent with pulmonary edema.  She is now for admission for the management of acute on chronic diastolic heart failure   ED Course: Patient was evaluated in the emergency department for her left arm pain.  She had no acute changes on EKG and her symptoms were atypical for cardiac related pain.  Troponin did return  mildly elevated at 58.  Patient during her ER evaluation did become acutely short of breath and hypertensive with a blood pressure of 190/120.  In the emergency department she was given Lasix 40 mg IV.  He was started on IV nitroglycerin for her acute hypertension.  She was referred to [facility] for [facility].  Review of Systems: As per HPI otherwise 10 point review of systems negative.  Patient specifically denies any chest pressure or chest discomfort.  She does report a recent history of pancreatitis but at this time denies any abdominal pain or discomfort.  Her pancreatitis was related to gallbladder disease.  Does have diagnosis of breast cancer in the right breast for which she is currently taking medical therapy with a plan for surgical therapy in the future.  She does have chronic peripheral edema but reports that this has been significantly worse over the past several days.   Past Medical History:  Diagnosis Date  . Anemia   . Arthritis   . Cancer ([place])   . Cataract   . Chronic diastolic CHF (congestive heart failure) ([place])   . CKD (chronic kidney disease), stage III ([place])   . Clotting disorder ([place])   . CVA (cerebral vascular accident) ([place])    residual right sided weakness and mild dysphagia  . Diabetes mellitus ([place])   . Dizziness and giddiness   . DVT (deep venous thrombosis) ([place])    a. On Coumadin for this.  . Essential hypertension   . LBBB (left bundle branch block)   . Mitral regurgitation    a. Mod MR by echo 2014.  . Muscular deconditioning   . Obesity   . SVT (supraventricular tachycardia) ([place])    a. In 2013 she had an EPS with ablation for SVT which did not eliminate the SVT completely. She also  had bradycardia which limited medication. She was placed on amiodarone by Dr. Lovena Le.  . Thrombocytopenia ([place]) 11/21/2011    Past Surgical History:  Procedure Laterality Date  . EYE SURGERY    . SUPRAVENTRICULAR TACHYCARDIA ABLATION N/A 10/11/2012   Procedure: SUPRAVENTRICULAR TACHYCARDIA ABLATION;  Surgeon: Evans Lance, MD;  Location: [facility] CATH LAB;  Service: Cardiovascular;  Laterality: N/A;     reports that she has never smoked. She has never used smokeless tobacco. She reports that she does not drink alcohol or use drugs.   Social  history -patient was married for 20 years and widowed in 1998.  She has 2 sons and 1 daughter, approximately 10 grandchildren, 4 great-grandchildren, and one great great grandchild.  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  She retired in 1998.  She has been living with her granddaughter recently in [place] and now is back in [place] with her daughter.  Allergies  Allergen Reactions  . Aspirin Itching  . Penicillins Itching and Rash    DID THE REACTION INVOLVE: Swelling of the face/tongue/throat, SOB, or low BP? y Sudden or severe rash/hives, skin peeling, or the inside of the mouth or nose? n Did it require medical treatment? yes When did it last happen?more than 10 years If all above answers are "NO", may proceed with cephalosporin use.     Family History  Problem Relation Age of Onset  . Stroke Mother   . Cancer Father        prostate  . Hypertension Daughter   . Heart attack Brother        x2  . Hypertension Brother   . Stroke Sister   . Hypertension Sister   . Breast cancer Maternal Aunt      Prior to Admission medications   Medication Sig Start Date End Date Taking? Authorizing Provider  acetaminophen (TYLENOL) 500 MG tablet Take 1,000 mg by mouth every 6 (six) hours as needed.    [provider]  amiodarone (PACERONE) 200 MG tablet Take 1 tablet (200 mg total) by mouth daily. 10/05/18   Jettie Booze, MD  anastrozole (ARIMIDEX) 1 MG tablet Take 1 tablet (1 mg total) by mouth daily. 11/28/18   Nicholas Lose, MD  apixaban (ELIQUIS) 5 MG TABS tablet Take 1 tablet (5 mg total) by mouth 2 (two) times daily. 02/04/19   [name], MD  atorvastatin (LIPITOR) 20 MG tablet Take 1 tablet (20 mg total) by mouth at bedtime. 02/04/19   [name], MD  BD PEN NEEDLE NANO U/F 32G X 4 MM MISC Use as directed 02/07/19   [name], MD  bumetanide (BUMEX) 1 MG tablet  Take 1 tablet (1 mg total) by mouth 2 (two) times daily. 1 and 1/2 tab in the am and 1 tab in the pm 06/03/19   [name], MD  calcium carbonate (OS-CAL) 600 MG TABS Take 600 mg by mouth daily with breakfast.     [provider]  carvedilol (COREG) 12.5 MG tablet Take 1 tablet (12.5 mg total) by mouth 2 (two) times daily. 02/04/19 05/13/19  [name], MD  diclofenac sodium (VOLTAREN) 1 % GEL Apply 2 g topically 4 (four) times daily. 10/30/18   Rodriguez-Southworth, Sunday Spillers, PA-C  doxycycline (VIBRAMYCIN) 100 MG capsule Take 1 capsule (100 mg total) by mouth 2 (two) times daily. Patient not taking: Reported on 06/08/2019 05/21/19   [facility] Cancer,  Bailey Mech, MD  glucose blood (ACCU-CHEK GUIDE) test strip Use as instructed to check blood sugars 3 times per day prn dx: e11.22 03/28/19   [name], MD  HUMALOG KWIKPEN 200 UNIT/ML SOPN Inject 4-8 Units into the skin 3 (three) times daily. Per sliding scale 02/04/19   [name], MD  isosorbide-hydrALAZINE (BIDIL) 20-37.5 MG tablet Take 1 tablet by mouth 2 (two) times daily.     [provider]  LEVEMIR FLEXTOUCH 100 UNIT/ML Pen Inject 10-16 Units into the skin daily. 16 units in the am and 10 units in the evening 05/29/19   [name], MD  magnesium oxide (MAG-OX) 400 MG tablet Take 400 mg by mouth daily.    [provider]  omeprazole (PRILOSEC) 40 MG capsule TAKE ONE CAPSULE BY MOUTH DAILY BEFORE A MEAL Patient taking differently: Take 40 mg by mouth daily before breakfast.  02/04/19   [name], MD  oxybutynin (DITROPAN) 5 MG tablet Take 1 tablet (5 mg total) by mouth 2 (two) times daily. 05/13/19   [name], MD  potassium chloride SA (K-DUR) 20 MEQ tablet Take 2 tablets (40 mEq total) by mouth daily. 03/07/19   [name], MD  pregabalin (LYRICA) 75 MG capsule Take 1 capsule (75 mg total) by mouth at bedtime. 05/13/19   [name], MD  traMADol (ULTRAM) 50 MG tablet Take 50 mg by mouth 2 (two) times daily as  needed for pain. 12/21/18   [provider]    Physical Exam: Vitals:   06/08/19 2000 06/08/19 2021 06/08/19 2030 06/08/19 2100  BP: (!) 163/98 (!) 167/99 (!) 166/98 (!) 159/92  Pulse: 69 69 66 67  Resp: (!) 24 (!) 23 (!) 23 (!) 24  Temp:      TempSrc:      SpO2: 99% 99% 100% 100%  Weight:        Constitutional: NAD, calm, comfortable Vitals:   06/08/19 2000 06/08/19 2021 06/08/19 2030 06/08/19 2100  BP: (!) 163/98 (!) 167/99 (!) 166/98 (!) 159/92  Pulse: 69 69 66 67  Resp: (!) 24 (!) 23 (!) 23 (!) 24  Temp:      TempSrc:      SpO2: 99% 99% 100% 100%  Weight:       General appearance - overweight, elderly woman in no distress Eyes: PERRL, bilateral arcus senilis, lids and conjunctivae normal ENMT: Mucous membranes are moist. Posterior pharynx clear of any exudate or lesions.Edentulous with dentures in place.  Neck: normal, supple, no masses, no thyromegaly Respiratory: Normal respiratory effort. No accessory muscle use. Bilateral crackles 1/2 way up back, mild end-expiratory wheeze Chest wall - pacemake left upper chest - not hot, not tender, no swelling of the pocket Breast Exam - deferred, followed by PCP and oncology Cardiovascular: Regular rate and rhythm, no murmurs / rubs / gallops. 3+ lower extremity edema to below the knee, 1+ pedal pulses. No carotid bruits. JVD to 5 cm at 30 degrees elevation, no HJR. Abdomen: Obese.  no tenderness, no masses palpated. No hepatosplenomegaly. Bowel sounds positive.  Musculoskeletal: no clubbing / cyanosis. No joint deformity upper and lower extremities. Hammer toe deformity toes.Good ROM, no contractures. Normal muscle tone left UE/LE, slightly diminished rigth UE Skin: no rashes, lesions, ulcers. No induration, chronic stasis changes distal LE Neurologic: CN 2-12 grossly intact. Sensation intact to light touch at plantar feet. Strength 5/5 left UE/LE, 4/5 grip strength right hand Psychiatric: Normal judgment and insight. Alert  and oriented x 3. Normal mood.  Labs on Admission: I have personally reviewed following labs and imaging studies  CBC: Recent Labs  Lab 06/08/19 1846  WBC 3.0*  NEUTROABS 1.2*  HGB 10.5*  HCT 33.5*  MCV 96.5  PLT 956*   Basic Metabolic Panel: Recent Labs  Lab 06/08/19 1846  NA 139  K 4.2  CL 103  CO2 26  GLUCOSE 87  BUN 28*  CREATININE 1.43*  CALCIUM 8.8*   GFR: Estimated Creatinine Clearance: 28.7 mL/min (A) (by C-G formula based on SCr of 1.43 mg/dL (H)). Liver Function Tests: Recent Labs  Lab 06/08/19 1846  AST 28  ALT 30  ALKPHOS 95  BILITOT 0.6  PROT 8.1  ALBUMIN 3.8   No results for input(s): LIPASE, AMYLASE in the last 168 hours. No results for input(s): AMMONIA in the last 168 hours. Coagulation Profile: No results for input(s): INR, PROTIME in the last 168 hours. Cardiac Enzymes: No results for input(s): CKTOTAL, CKMB, CKMBINDEX, TROPONINI in the last 168 hours. BNP (last 3 results) Recent Labs    07/06/18 1118 08/06/18 1244  PROBNP 1,500* 1,604*   HbA1C: No results for input(s): HGBA1C in the last 72 hours. CBG: No results for input(s): GLUCAP in the last 168 hours. Lipid Profile: No results for input(s): CHOL, HDL, LDLCALC, TRIG, CHOLHDL, LDLDIRECT in the last 72 hours. Thyroid Function Tests: No results for input(s): TSH, T4TOTAL, FREET4, T3FREE, THYROIDAB in the last 72 hours. Anemia Panel: No results for input(s): VITAMINB12, FOLATE, FERRITIN, TIBC, IRON, RETICCTPCT in the last 72 hours. Urine analysis:    Component Value Date/Time   COLORURINE YELLOW 01/01/2019 1723   APPEARANCEUR HAZY (A) 01/01/2019 1723   LABSPEC 1.009 01/01/2019 1723   PHURINE 6.0 01/01/2019 1723   GLUCOSEU >=500 (A) 01/01/2019 1723   HGBUR SMALL (A) 01/01/2019 1723   BILIRUBINUR negative 05/21/2019 1448   KETONESUR 5 (A) 01/01/2019 1723   PROTEINUR Positive (A) 05/21/2019 1448   PROTEINUR NEGATIVE 01/01/2019 1723   UROBILINOGEN 0.2 05/21/2019 1448    UROBILINOGEN 0.2 10/12/2012 2037   NITRITE negative 05/21/2019 1448   NITRITE NEGATIVE 01/01/2019 1723   LEUKOCYTESUR Large (3+) (A) 05/21/2019 1448   LEUKOCYTESUR LARGE (A) 01/01/2019 1723    Radiological Exams on Admission: Dg Chest 2 View  Result Date: 06/08/2019 CLINICAL DATA:  LEFT neck, shoulder and arm pain. Pacemaker placed 1 month ago. Wheezing. EXAM: CHEST - 2 VIEW COMPARISON:  Chest x-ray dated 11/02/2013. FINDINGS: Mild cardiomegaly. LEFT chest wall dual lead pacemaker in place. Central pulmonary vascular congestion. Prominent interstitial markings bilaterally, presumably edema. No pleural effusion or pneumothorax seen. No acute appearing osseous abnormality. IMPRESSION: Cardiomegaly with central pulmonary vascular congestion and bilateral interstitial prominence, presumably edema, suggesting CHF/volume overload. Electronically Signed   By: Franki Cabot M.D.   On: 06/08/2019 18:30   Dg Cervical Spine Complete  Result Date: 06/08/2019 CLINICAL DATA:  Pain EXAM: CERVICAL SPINE - COMPLETE 4+ VIEW COMPARISON:  None. FINDINGS: There is no prevertebral soft tissue swelling. There appears to be grade 1 anterolisthesis of C5 on C6 and C6 on C7. These levels are difficult to evaluate secondary to overlapping soft tissues on the lateral view. There is mild bilateral osseous neural foraminal narrowing. IMPRESSION: 1. No acute displaced fracture. 2. Mild-to-moderate multilevel degenerative changes are noted of the cervical spine. 3. Possible grade 1 anterolisthesis of C5 on C6 and C6 on C7. Electronically Signed   By: Constance Holster M.D.   On: 06/08/2019 17:40   Dg Shoulder Left  Result Date:  06/08/2019 CLINICAL DATA:  Neck and shoulder pain. EXAM: LEFT SHOULDER - 2+ VIEW COMPARISON:  None. FINDINGS: There is no acute displaced fracture or dislocation. There are degenerative changes of the glenohumeral and acromioclavicular joints. There is some superior subluxation of the humeral head which  is presumably chronic. IMPRESSION: No acute osseous abnormality. Electronically Signed   By: Constance Holster M.D.   On: 06/08/2019 17:38    EKG: Independently reviewed. NSR, LBBB, no acute changes  Assessment/Plan Active Problems:   Acute on chronic diastolic CHF (congestive heart failure) (HCC)   CKD (chronic kidney disease), stage III (HCC)   HTN (hypertension)   DM (diabetes mellitus) ([place])  (please populate well all problems here in Problem List. (For example, if patient is on BP meds at home and you resume or decide to hold them, it is a problem that needs to be her. Same for CAD, COPD, HLD and so on)   1.  Acute on chronic diastolic heart failure -patient developed acute shortness of breath while in the emergency department.  Chart review reveals she had a Myoview study in June 2019 which was a low risk study with a fixed defect.  2D echo 10/12/2017 with LVH, LVEF 50 to 55%, severe pulmonary hypertension, severe left atrial enlargement, mild MR and TR.  By family report patient had 2D echo in [place] in the last year.  Patient has been on a medical regimen including nitrates, hydralazine, carvedilol and diuretic.  She had previously been on lisinopril but this was discontinued -possibly due to CKD 3.  Patient has responded in the ER to IV Lasix with a diuresis of approximately 850 cc. Plan telemetry observation admission for acute on chronic heart failure  Continued IV Lasix at 40 mg every 6 hours for least 1 or 2 more doses.  Continue the patient's home medications with the addition of low-dose lisinopril  Follow-up chest x-ray in a.m.  2 additional troponins at 2-hour intervals.  Repeat BNP in the a.m.  Request cardiology consult - message sent to oncall fellow.  2.  Hypertension-longstanding hypertension with documented pulmonary hypertension and left ventricular enlargement.  Patient had missed several doses of medication.  During her emergency department stay her blood pressure  did spike to 190/120.  She was treated in the ER with IV nitroglycerin with good response.  At admission the IV nitroglycerin is discontinued and she will be continued on her home medications Plan continue home medications  Follow up metabolic panel in the a.m. with particular attention to her renal function  3.  Diabetes -last A1c on record was 8.4% more than 120 days ago.  She reports that she has been adherent to her basal insulin therapy and sliding scale. Plan A1C  continue her home dose of Levemir  Sliding scale coverage with no at bedtime coverage  Heart healthy/carb modified diet  Standard glycemic protocol  4.  CKD 3 -patient's admission creatinine is 1.43.  Continue her home regimen for hypertension and diabetes management.  Follow-up metabolic panel for a.m. is noted  5.  Neuro -patient reports she has had a CVA with some residual right-sided weakness.  Does complain of some swallowing difficulty particularly of meat.  PCP was going to schedule a swallowing study as an outpatient. Plan we will asked the patient's diet be modified to include chopped meats  If the patient's stay is prolonged will consider doing a swallowing study while in-patient  6.  Left arm and back pain -patient in ER including  imaging studies was unremarkable.  His pain seems to be mild.  No evidence of any infection normality around her pacemaker.  Symptoms appear to be noncardiac. Plan will cycle cardiac enzymes as above.  DVT prophylaxis: On Eliquis and will continue (Lovenox/Heparin/SCD's/anticoagulated/None (if comfort care) Code Status: Full code (Full/Partial (specify details) Family Communication: The diagnosis and treatment plan was discussed with the patient her daughter and granddaughter by phone.  They understand and agree with the above (Specify name, relationship. Do not write "discussed with patient". Specify tel # if discussed over the phone) Disposition Plan: Home in 24 to 48 hours (specify when  and where you expect patient to be discharged) Consults called: Cardiology -routine consult tomorrow (with names) Admission status: Observation-tele (inpatient / obs / tele / medical floor / SDU)   Adella Hare MD [facility] Pager [phone]  If 7PM-7AM, please contact night-coverage [email or web address] Password [facility]  06/08/2019, 9:28 PM

## 2019-06-08 NOTE — ED Notes (Signed)
ED TO INPATIENT HANDOFF REPORT  Name/Age/Gender [name] 85 y.o. female  Code Status    Code Status Orders  (From admission, onward)         Start     Ordered   06/08/19 2031  Full code  Continuous     06/08/19 2037        Code Status History    Date Active Date Inactive Code Status Order ID Comments User Context   11/06/2018 2300 11/09/2018 2152 Full Code [id number]  Rise Patience, MD Inpatient   Advance Care Planning Activity      Home/SNF/Other Home  Chief Complaint arm, shoulder pain  Level of Care/Admitting Diagnosis ED Disposition    ED Disposition Condition [facility] Area: [place] [[id number]]  Level of Care: Telemetry [5]  Admit to tele based on following criteria: Acute CHF  Covid Evaluation: Asymptomatic Screening Protocol (No Symptoms)  Diagnosis: Acute on chronic diastolic CHF (congestive heart failure) ([place]) [063016]  Admitting Physician: Neena Rhymes [[id number]]  Attending Physician: Adella Hare E [[id number]]  PT Class (Do Not Modify): Observation [104]  PT Acc Code (Do Not Modify): Observation [10022]       Medical History Past Medical History:  Diagnosis Date  . Anemia   . Arthritis   . Cancer ([place])   . Cataract   . Chronic diastolic CHF (congestive heart failure) ([place])   . CKD (chronic kidney disease), stage III ([place])   . Clotting disorder ([place])   . CVA (cerebral vascular accident) ([place])    residual right sided weakness and mild dysphagia  . Diabetes mellitus ([place])   . Dizziness and giddiness   . DVT (deep venous thrombosis) ([place])    a. On Coumadin for this.  . Essential hypertension   . LBBB (left bundle branch block)   . Mitral regurgitation    a. Mod MR by echo 2014.  . Muscular deconditioning   . Obesity   . SVT (supraventricular tachycardia) ([place])    a. In 2013 she had an EPS with ablation for SVT which did not eliminate the SVT completely. She also had bradycardia which limited  medication. She was placed on amiodarone by Dr. Lovena Le.  . Thrombocytopenia ([place]) 11/21/2011    Allergies Allergies  Allergen Reactions  . Aspirin Itching  . Penicillins Itching and Rash    DID THE REACTION INVOLVE: Swelling of the face/tongue/throat, SOB, or low BP? y Sudden or severe rash/hives, skin peeling, or the inside of the mouth or nose? n Did it require medical treatment? yes When did it last happen?more than 10 years If all above answers are "NO", may proceed with cephalosporin use.     IV Location/Drains/Wounds Patient Lines/Drains/Airways Status   Active Line/Drains/Airways    Name:   Placement date:   Placement time:   Site:   Days:   Peripheral IV 06/08/19 Left Antecubital   06/08/19    1925    Antecubital   less than 1   Pressure Injury 01/02/19 Stage II -  Partial thickness loss of dermis presenting as a shallow open ulcer with a red, pink wound bed without slough.   01/02/19    0430     157          Labs/Imaging Results for orders placed or performed during the hospital encounter of 06/08/19 (from the past 48 hour(s))  CBC with Differential/Platelet     Status: Abnormal   Collection Time: 06/08/19  6:46 PM  Result Value Ref Range   WBC 3.0 (L) 4.0 - 10.5 K/uL   RBC 3.47 (L) 3.87 - 5.11 MIL/uL   Hemoglobin 10.5 (L) 12.0 - 15.0 g/dL   HCT 33.5 (L) 36.0 - 46.0 %   MCV 96.5 80.0 - 100.0 fL   MCH 30.3 26.0 - 34.0 pg   MCHC 31.3 30.0 - 36.0 g/dL   RDW 14.3 11.5 - 15.5 %   Platelets 118 (L) 150 - 400 K/uL    Comment: REPEATED TO VERIFY PLATELET COUNT CONFIRMED BY SMEAR SPECIMEN CHECKED FOR CLOTS Immature Platelet Fraction may be clinically indicated, consider ordering this additional test [id number]    nRBC 0.0 0.0 - 0.2 %   Neutrophils Relative % 40 %   Neutro Abs 1.2 (L) 1.7 - 7.7 K/uL   Lymphocytes Relative 42 %   Lymphs Abs 1.2 0.7 - 4.0 K/uL   Monocytes Relative 11 %   Monocytes Absolute 0.3 0.1 - 1.0 K/uL   Eosinophils Relative 6 %    Eosinophils Absolute 0.2 0.0 - 0.5 K/uL   Basophils Relative 1 %   Basophils Absolute 0.0 0.0 - 0.1 K/uL   Immature Granulocytes 0 %   Abs Immature Granulocytes 0.00 0.00 - 0.07 K/uL    Comment: Performed at [facility], [place] [address] [place], [place] [address]  Comprehensive metabolic panel     Status: Abnormal   Collection Time: 06/08/19  6:46 PM  Result Value Ref Range   Sodium 139 135 - 145 mmol/L   Potassium 4.2 3.5 - 5.1 mmol/L   Chloride 103 98 - 111 mmol/L   CO2 26 22 - 32 mmol/L   Glucose, Bld 87 70 - 99 mg/dL   BUN 28 (H) 8 - 23 mg/dL   Creatinine, Ser 1.43 (H) 0.44 - 1.00 mg/dL   Calcium 8.8 (L) 8.9 - 10.3 mg/dL   Total Protein 8.1 6.5 - 8.1 g/dL   Albumin 3.8 3.5 - 5.0 g/dL   AST 28 15 - 41 U/L   ALT 30 0 - 44 U/L   Alkaline Phosphatase 95 38 - 126 U/L   Total Bilirubin 0.6 0.3 - 1.2 mg/dL   GFR calc non Af Amer 34 (L) >60 mL/min   GFR calc Af Amer 39 (L) >60 mL/min   Anion gap 10 5 - 15    Comment: Performed at [facility], [place] [address] [place], [place] [address]  Brain natriuretic peptide     Status: Abnormal   Collection Time: 06/08/19  6:46 PM  Result Value Ref Range   B Natriuretic Peptide 557.6 (H) 0.0 - 100.0 pg/mL    Comment: Performed at [facility], [place] [address], [place], [place] [address]  Troponin I (High Sensitivity)     Status: Abnormal   Collection Time: 06/08/19  6:46 PM  Result Value Ref Range   Troponin I (High Sensitivity) 58 (H) <18 ng/L    Comment: (NOTE) Elevated high sensitivity troponin I (hsTnI) values and significant  changes across serial measurements may suggest ACS but many other  chronic and acute conditions are known to elevate hsTnI results.  Refer to the "Links" section for chest pain algorithms and additional  guidance. Performed at [facility], [place] [address] [place], [place] [address]   SARS Coronavirus 2 [facility] order, Performed in  [facility] hospital lab) Nasopharyngeal Nasopharyngeal Swab     Status: None   Collection Time: 06/08/19  8:04  PM   Specimen: Nasopharyngeal Swab  Result Value Ref Range   SARS Coronavirus 2 NEGATIVE NEGATIVE    Comment: (NOTE) If result is NEGATIVE SARS-CoV-2 target nucleic acids are NOT DETECTED. The SARS-CoV-2 RNA is generally detectable in upper and lower  respiratory specimens during the acute phase of infection. The lowest  concentration of SARS-CoV-2 viral copies this assay can detect is 250  copies / mL. A negative result does not preclude SARS-CoV-2 infection  and should not be used as the sole basis for treatment or other  patient management decisions.  A negative result may occur with  improper specimen collection / handling, submission of specimen other  than nasopharyngeal swab, presence of viral mutation(s) within the  areas targeted by this assay, and inadequate number of viral copies  (<250 copies / mL). A negative result must be combined with clinical  observations, patient history, and epidemiological information. If result is POSITIVE SARS-CoV-2 target nucleic acids are DETECTED. The SARS-CoV-2 RNA is generally detectable in upper and lower  respiratory specimens dur ing the acute phase of infection.  Positive  results are indicative of active infection with SARS-CoV-2.  Clinical  correlation with patient history and other diagnostic information is  necessary to determine patient infection status.  Positive results do  not rule out bacterial infection or co-infection with other viruses. If result is PRESUMPTIVE POSTIVE SARS-CoV-2 nucleic acids MAY BE PRESENT.   A presumptive positive result was obtained on the submitted specimen  and confirmed on repeat testing.  While 2019 novel coronavirus  (SARS-CoV-2) nucleic acids may be present in the submitted sample  additional confirmatory testing may be necessary for epidemiological  and / or clinical management  purposes  to differentiate between  SARS-CoV-2 and other Sarbecovirus currently known to infect humans.  If clinically indicated additional testing with an alternate test  methodology [phone]) is advised. The SARS-CoV-2 RNA is generally  detectable in upper and lower respiratory sp ecimens during the acute  phase of infection. The expected result is Negative. Fact Sheet for Patients:  StrictlyIdeas.no Fact Sheet for Healthcare Providers: BankingDealers.co.za This test is not yet approved or cleared by the [place] FDA and has been authorized for detection and/or diagnosis of SARS-CoV-2 by FDA under an Emergency Use Authorization (EUA).  This EUA will remain in effect (meaning this test can be used) for the duration of the COVID-19 declaration under Section 564(b)(1) of the Act, 21 U.S.C. section 360bbb-3(b)(1), unless the authorization is terminated or revoked sooner. Performed at [facility], [place] [address] [place], [place] [address]   Troponin I (High Sensitivity)     Status: Abnormal   Collection Time: 06/08/19  8:37 PM  Result Value Ref Range   Troponin I (High Sensitivity) 111 (HH) <18 ng/L    Comment: CRITICAL RESULT CALLED TO, READ BACK BY AND VERIFIED WITH: A,BULLCKY AT 2208 ON 06/08/19 BY A,MOHAMED (NOTE) Elevated high sensitivity troponin I (hsTnI) values and significant  changes across serial measurements may suggest ACS but many other  chronic and acute conditions are known to elevate hsTnI results.  Refer to the Links section for chest pain algorithms and additional  guidance. Performed at [facility], [place] [address], [place], [place] [address]   Hemoglobin A1c     Status: Abnormal   Collection Time: 06/08/19  8:37 PM  Result Value Ref Range   Hgb A1c MFr Bld 7.9 (H) 4.8 - 5.6 %    Comment: (NOTE) Pre diabetes:  5.7%-6.4% Diabetes:              >6.4% Glycemic  control for   <7.0% adults with diabetes    Mean Plasma Glucose 180.03 mg/dL    Comment: Performed at [facility] [address], [place], [place] [address]  CBG monitoring, ED     Status: None   Collection Time: 06/08/19 10:28 PM  Result Value Ref Range   Glucose-Capillary 79 70 - 99 mg/dL   Dg Chest 2 View  Result Date: 06/08/2019 CLINICAL DATA:  LEFT neck, shoulder and arm pain. Pacemaker placed 1 month ago. Wheezing. EXAM: CHEST - 2 VIEW COMPARISON:  Chest x-ray dated 11/02/2013. FINDINGS: Mild cardiomegaly. LEFT chest wall dual lead pacemaker in place. Central pulmonary vascular congestion. Prominent interstitial markings bilaterally, presumably edema. No pleural effusion or pneumothorax seen. No acute appearing osseous abnormality. IMPRESSION: Cardiomegaly with central pulmonary vascular congestion and bilateral interstitial prominence, presumably edema, suggesting CHF/volume overload. Electronically Signed   By: Franki Cabot M.D.   On: 06/08/2019 18:30   Dg Cervical Spine Complete  Result Date: 06/08/2019 CLINICAL DATA:  Pain EXAM: CERVICAL SPINE - COMPLETE 4+ VIEW COMPARISON:  None. FINDINGS: There is no prevertebral soft tissue swelling. There appears to be grade 1 anterolisthesis of C5 on C6 and C6 on C7. These levels are difficult to evaluate secondary to overlapping soft tissues on the lateral view. There is mild bilateral osseous neural foraminal narrowing. IMPRESSION: 1. No acute displaced fracture. 2. Mild-to-moderate multilevel degenerative changes are noted of the cervical spine. 3. Possible grade 1 anterolisthesis of C5 on C6 and C6 on C7. Electronically Signed   By: Constance Holster M.D.   On: 06/08/2019 17:40   Dg Shoulder Left  Result Date: 06/08/2019 CLINICAL DATA:  Neck and shoulder pain. EXAM: LEFT SHOULDER - 2+ VIEW COMPARISON:  None. FINDINGS: There is no acute displaced fracture or dislocation. There are degenerative changes of the glenohumeral and  acromioclavicular joints. There is some superior subluxation of the humeral head which is presumably chronic. IMPRESSION: No acute osseous abnormality. Electronically Signed   By: Constance Holster M.D.   On: 06/08/2019 17:38    Pending Labs Unresulted Labs (From admission, onward)    Start     Ordered   06/09/19 9381  Basic metabolic panel  Daily,   R     06/08/19 2037          Vitals/Pain Today's Vitals   06/08/19 2100 06/08/19 2130 06/08/19 2200 06/08/19 2220  BP: (!) 159/92 (!) 162/92 (!) 157/87   Pulse: 67  74   Resp: (!) 24 20 (!) 22   Temp:      TempSrc:      SpO2: 100%  99%   Weight:      PainSc:    5     Isolation Precautions No active isolations  Medications Medications  anastrozole (ARIMIDEX) tablet 1 mg (has no administration in time range)  amiodarone (PACERONE) tablet 200 mg (has no administration in time range)  atorvastatin (LIPITOR) tablet 20 mg (has no administration in time range)  Insulin Detemir (LEVEMIR) FlexPen 10-16 Units (has no administration in time range)  magnesium oxide (MAG-OX) tablet 400 mg (has no administration in time range)  pantoprazole (PROTONIX) EC tablet 40 mg (has no administration in time range)  oxybutynin (DITROPAN) tablet 5 mg (has no administration in time range)  apixaban (ELIQUIS) tablet 5 mg (has no administration in time range)  pregabalin (LYRICA) capsule 75 mg (  has no administration in time range)  acetaminophen (TYLENOL) tablet 650 mg (has no administration in time range)  ondansetron (ZOFRAN) injection 4 mg (has no administration in time range)  furosemide (LASIX) injection 40 mg (has no administration in time range)  lisinopril (ZESTRIL) tablet 2.5 mg (has no administration in time range)  insulin aspart (novoLOG) injection 0-20 Units (has no administration in time range)  furosemide (LASIX) 10 MG/ML injection (has no administration in time range)  carvedilol (COREG) tablet 25 mg (has no administration in time  range)  isosorbide-hydrALAZINE (BIDIL) 20-37.5 MG per tablet 1 tablet (has no administration in time range)  HYDROcodone-acetaminophen (NORCO/VICODIN) 5-325 MG per tablet 1 tablet (1 tablet Oral Given 06/08/19 1826)  albuterol (VENTOLIN HFA) 108 (90 Base) MCG/ACT inhaler 2 puff (2 puffs Inhalation Given 06/08/19 1826)  furosemide (LASIX) injection 40 mg (40 mg Intravenous Given 06/08/19 1916)    Mobility walks with device

## 2019-06-08 NOTE — ED Provider Notes (Signed)
[facility] DEPT Provider Note   CSN: [id number] Arrival date & time: 06/08/19  1322    History   Chief Complaint Chief Complaint  Patient presents with  . Arm Pain    HPI [name] is a 85 y.o. female.     HPI Patient presents with 3 days of neck pain which radiates to her left shoulder and down her left arm.  Denies any known injury.  States she has tingling to her fingers but attributes this to her chronic peripheral neuropathy.  Denies any new weakness.  No chest pain or shortness of breath.  No fever or chills. Past Medical History:  Diagnosis Date  . Anemia   . Arthritis   . Cancer ([place])   . Cataract   . Chronic diastolic CHF (congestive heart failure) ([place])   . CKD (chronic kidney disease), stage III ([place])   . Clotting disorder ([place])   . Diabetes mellitus ([place])   . Dizziness and giddiness   . DVT (deep venous thrombosis) ([place])    a. On Coumadin for this.  . Essential hypertension   . LBBB (left bundle branch block)   . Mitral regurgitation    a. Mod MR by echo 2014.  . Muscular deconditioning   . Obesity   . SVT (supraventricular tachycardia) ([place])    a. In 2013 she had an EPS with ablation for SVT which did not eliminate the SVT completely. She also had bradycardia which limited medication. She was placed on amiodarone by Dr. Lovena Le.  . Thrombocytopenia ([place]) 11/21/2011    Patient Active Problem List   Diagnosis Date Noted  . Pressure injury of skin 01/04/2019  . Acute pancreatitis 01/02/2019  . Malignant neoplasm of lower-inner quadrant of right breast of female, estrogen receptor positive ([place]) 11/23/2018  . Hematoma of right iliopsoas muscle 11/06/2018  . Intramuscular hematoma 11/06/2018  . Hypertensive heart disease with heart failure ([place]) 09/05/2017  . Aphasia 08/31/2017  . Dysphagia 08/31/2017  . CKD (chronic kidney disease), stage III ([place])   . LBBB (left bundle branch block)   . Anemia   . HTN (hypertension)    . Obesity   . DVT (deep venous thrombosis) ([place])   . DM (diabetes mellitus) ([place]) 07/28/2015  . Fall 07/28/2015  . Warfarin-induced coagulopathy ([place]) 07/28/2015  . Acute on chronic diastolic CHF (congestive heart failure) ([place]) 03/19/2014  . Edema 08/06/2013  . SVT (supraventricular tachycardia) ([place]) 10/15/2012  . First degree AV block 10/15/2012  . Acute renal insufficiency 10/14/2012  . Dizziness and giddiness   . Thrombocytopenia ([place]) 11/21/2011    Past Surgical History:  Procedure Laterality Date  . EYE SURGERY    . SUPRAVENTRICULAR TACHYCARDIA ABLATION N/A 10/11/2012   Procedure: SUPRAVENTRICULAR TACHYCARDIA ABLATION;  Surgeon: Evans Lance, MD;  Location: [facility] CATH LAB;  Service: Cardiovascular;  Laterality: N/A;     OB History   No obstetric history on file.      Home Medications    Prior to Admission medications   Medication Sig Start Date End Date Taking? Authorizing Provider  acetaminophen (TYLENOL) 500 MG tablet Take 1,000 mg by mouth every 6 (six) hours as needed.    [provider]  amiodarone (PACERONE) 200 MG tablet Take 1 tablet (200 mg total) by mouth daily. 10/05/18   Jettie Booze, MD  anastrozole (ARIMIDEX) 1 MG tablet Take 1 tablet (1 mg total) by mouth daily. 11/28/18   Nicholas Lose, MD  apixaban (ELIQUIS) 5 MG  TABS tablet Take 1 tablet (5 mg total) by mouth 2 (two) times daily. 02/04/19   [name], MD  atorvastatin (LIPITOR) 20 MG tablet Take 1 tablet (20 mg total) by mouth at bedtime. 02/04/19   [name], MD  BD PEN NEEDLE NANO U/F 32G X 4 MM MISC Use as directed 02/07/19   [name], MD  bumetanide (BUMEX) 1 MG tablet Take 1 tablet (1 mg total) by mouth 2 (two) times daily. 1 and 1/2 tab in the am and 1 tab in the pm 06/03/19   [name], MD  calcium carbonate (OS-CAL) 600 MG TABS Take 600 mg by mouth daily with breakfast.     [provider]  carvedilol (COREG) 12.5 MG tablet Take 1 tablet (12.5 mg total)  by mouth 2 (two) times daily. 02/04/19 05/13/19  [name], MD  diclofenac sodium (VOLTAREN) 1 % GEL Apply 2 g topically 4 (four) times daily. 10/30/18   Rodriguez-Southworth, Sunday Spillers, PA-C  doxycycline (VIBRAMYCIN) 100 MG capsule Take 1 capsule (100 mg total) by mouth 2 (two) times daily. Patient not taking: Reported on 06/08/2019 05/21/19   [name], MD  glucose blood (ACCU-CHEK GUIDE) test strip Use as instructed to check blood sugars 3 times per day prn dx: e11.22 03/28/19   [name], MD  HUMALOG KWIKPEN 200 UNIT/ML SOPN Inject 4-8 Units into the skin 3 (three) times daily. Per sliding scale 02/04/19   [name], MD  isosorbide-hydrALAZINE (BIDIL) 20-37.5 MG tablet Take 1 tablet by mouth 2 (two) times daily.     [provider]  LEVEMIR FLEXTOUCH 100 UNIT/ML Pen Inject 10-16 Units into the skin daily. 16 units in the am and 10 units in the evening 05/29/19   [name], MD  magnesium oxide (MAG-OX) 400 MG tablet Take 400 mg by mouth daily.    [provider]  omeprazole (PRILOSEC) 40 MG capsule TAKE ONE CAPSULE BY MOUTH DAILY BEFORE A MEAL Patient taking differently: Take 40 mg by mouth daily before breakfast.  02/04/19   [name], MD  oxybutynin (DITROPAN) 5 MG tablet Take 1 tablet (5 mg total) by mouth 2 (two) times daily. 05/13/19   [name], MD  potassium chloride SA (K-DUR) 20 MEQ tablet Take 2 tablets (40 mEq total) by mouth daily. 03/07/19   [name], MD  pregabalin (LYRICA) 75 MG capsule Take 1 capsule (75 mg total) by mouth at bedtime. 05/13/19   [name], MD  traMADol (ULTRAM) 50 MG tablet Take 50 mg by mouth 2 (two) times daily as needed for pain. 12/21/18   [provider]    Family History Family History  Problem Relation Age of Onset  . Stroke Mother   . Cancer Father        prostate  . Hypertension Daughter   . Heart attack Brother        x2  . Hypertension Brother   . Stroke Sister   . Hypertension Sister   .  Breast cancer Maternal Aunt     Social History Social History   Tobacco Use  . Smoking status: Never Smoker  . Smokeless tobacco: Never Used  Substance Use Topics  . Alcohol use: No  . Drug use: No     Allergies   Aspirin and Penicillins   Review of Systems Review of Systems  Constitutional: Negative for chills and fever.  HENT: Negative for sore throat and trouble swallowing.   Eyes: Negative for visual disturbance.  Respiratory: Negative for  cough and shortness of breath.   Cardiovascular: Negative for chest pain.  Gastrointestinal: Negative for abdominal pain, nausea and vomiting.  Musculoskeletal: Positive for arthralgias, back pain, myalgias and neck pain. Negative for neck stiffness.  Skin: Negative for rash and wound.  Neurological: Positive for numbness. Negative for dizziness, weakness, light-headedness and headaches.  All other systems reviewed and are negative.    Physical Exam Updated Vital Signs BP (!) 163/98   Pulse 69   Temp 98.8 F (37.1 C) (Oral)   Resp (!) 24   Wt 85 kg   SpO2 99%   BMI 35.88 kg/m   Physical Exam Vitals signs and nursing note reviewed.  Constitutional:      Appearance: Normal appearance. She is well-developed.  HENT:     Head: Normocephalic and atraumatic.     Nose: Nose normal.     Mouth/Throat:     Mouth: Mucous membranes are moist.  Eyes:     Extraocular Movements: Extraocular movements intact.     Pupils: Pupils are equal, round, and reactive to light.  Neck:     Musculoskeletal: Normal range of motion and neck supple. Muscular tenderness present. No neck rigidity.     Comments: Patient with diffuse posterior cervical tenderness especially in the left paracervical and trapezius musculature.  No meningismus. Cardiovascular:     Rate and Rhythm: Normal rate and regular rhythm.     Heart sounds: No murmur. No friction rub. No gallop.   Pulmonary:     Effort: Pulmonary effort is normal. No respiratory distress.      Breath sounds: Normal breath sounds. No stridor. No wheezing, rhonchi or rales.  Chest:     Chest wall: No tenderness.  Abdominal:     General: Bowel sounds are normal.     Palpations: Abdomen is soft.     Tenderness: There is no abdominal tenderness. There is no guarding or rebound.  Musculoskeletal: Normal range of motion.        General: Tenderness present.     Comments: Patient has left thoracic muscular tenderness to palpation.  No midline thoracic tenderness or lumbar tenderness.  Full range of motion of the left shoulder without obvious deformity or effusion.  Full range of motion of left wrist and elbow without deformity.  Distal pulses are 2+.  Lymphadenopathy:     Cervical: No cervical adenopathy.  Skin:    General: Skin is warm and dry.     Capillary Refill: Capillary refill takes less than 2 seconds.     Findings: No erythema or rash.  Neurological:     Mental Status: She is alert and oriented to person, place, and time.     Comments: 5/5 grip strength in the left upper extremity.  5/5 strength in left lower extremity.  Right side with residual deficits related to previous stroke.  Mild decreased sensation to light touch to the entire left hand.  Psychiatric:        Mood and Affect: Mood normal.        Behavior: Behavior normal.      ED Treatments / Results  Labs (all labs ordered are listed, but only abnormal results are displayed) Labs Reviewed  CBC WITH DIFFERENTIAL/PLATELET - Abnormal; Notable for the following components:      Result Value   WBC 3.0 (*)    RBC 3.47 (*)    Hemoglobin 10.5 (*)    HCT 33.5 (*)    Platelets 118 (*)    Neutro Abs 1.2 (*)  All other components within normal limits  COMPREHENSIVE METABOLIC PANEL - Abnormal; Notable for the following components:   BUN 28 (*)    Creatinine, Ser 1.43 (*)    Calcium 8.8 (*)    GFR calc non Af Amer 34 (*)    GFR calc Af Amer 39 (*)    All other components within normal limits  BRAIN NATRIURETIC  PEPTIDE - Abnormal; Notable for the following components:   B Natriuretic Peptide 557.6 (*)    All other components within normal limits  TROPONIN I (HIGH SENSITIVITY) - Abnormal; Notable for the following components:   Troponin I (High Sensitivity) 58 (*)    All other components within normal limits  SARS CORONAVIRUS 2 (HOSPITAL ORDER, [facility])  TROPONIN I (HIGH SENSITIVITY)    EKG EKG Interpretation  Date/Time:  Saturday June 08 2019 13:54:50 EDT Ventricular Rate:  63 PR Interval:    QRS Duration: 165 QT Interval:  491 QTC Calculation: 503 R Axis:   -75 Text Interpretation:  Sinus rhythm Left bundle branch block Confirmed by Julianne Rice [phone]) on 06/08/2019 6:40:04 PM   Radiology Dg Chest 2 View  Result Date: 06/08/2019 CLINICAL DATA:  LEFT neck, shoulder and arm pain. Pacemaker placed 1 month ago. Wheezing. EXAM: CHEST - 2 VIEW COMPARISON:  Chest x-ray dated 11/02/2013. FINDINGS: Mild cardiomegaly. LEFT chest wall dual lead pacemaker in place. Central pulmonary vascular congestion. Prominent interstitial markings bilaterally, presumably edema. No pleural effusion or pneumothorax seen. No acute appearing osseous abnormality. IMPRESSION: Cardiomegaly with central pulmonary vascular congestion and bilateral interstitial prominence, presumably edema, suggesting CHF/volume overload. Electronically Signed   By: Franki Cabot M.D.   On: 06/08/2019 18:30   Dg Cervical Spine Complete  Result Date: 06/08/2019 CLINICAL DATA:  Pain EXAM: CERVICAL SPINE - COMPLETE 4+ VIEW COMPARISON:  None. FINDINGS: There is no prevertebral soft tissue swelling. There appears to be grade 1 anterolisthesis of C5 on C6 and C6 on C7. These levels are difficult to evaluate secondary to overlapping soft tissues on the lateral view. There is mild bilateral osseous neural foraminal narrowing. IMPRESSION: 1. No acute displaced fracture. 2. Mild-to-moderate multilevel degenerative  changes are noted of the cervical spine. 3. Possible grade 1 anterolisthesis of C5 on C6 and C6 on C7. Electronically Signed   By: Constance Holster M.D.   On: 06/08/2019 17:40   Dg Shoulder Left  Result Date: 06/08/2019 CLINICAL DATA:  Neck and shoulder pain. EXAM: LEFT SHOULDER - 2+ VIEW COMPARISON:  None. FINDINGS: There is no acute displaced fracture or dislocation. There are degenerative changes of the glenohumeral and acromioclavicular joints. There is some superior subluxation of the humeral head which is presumably chronic. IMPRESSION: No acute osseous abnormality. Electronically Signed   By: Constance Holster M.D.   On: 06/08/2019 17:38    Procedures Procedures (including critical care time)  Medications Ordered in ED Medications  nitroGLYCERIN 50 mg in dextrose 5 % 250 mL (0.2 mg/mL) infusion (4 mcg/min Intravenous New Bag/Given 06/08/19 2020)  HYDROcodone-acetaminophen (NORCO/VICODIN) 5-325 MG per tablet 1 tablet (1 tablet Oral Given 06/08/19 1826)  albuterol (VENTOLIN HFA) 108 (90 Base) MCG/ACT inhaler 2 puff (2 puffs Inhalation Given 06/08/19 1826)  furosemide (LASIX) injection 40 mg (40 mg Intravenous Given 06/08/19 1916)   CRITICAL CARE Performed by: Julianne Rice Total critical care time: 35 minutes Critical care time was exclusive of separately billable procedures and treating other patients. Critical care was necessary to treat or prevent imminent or  life-threatening deterioration. Critical care was time spent personally by me on the following activities: development of treatment plan with patient and/or surrogate as well as nursing, discussions with consultants, evaluation of patient's response to treatment, examination of patient, obtaining history from patient or surrogate, ordering and performing treatments and interventions, ordering and review of laboratory studies, ordering and review of radiographic studies, pulse oximetry and re-evaluation of patient's  condition.  Initial Impression / Assessment and Plan / ED Course  I have reviewed the triage vital signs and the nursing notes.  Pertinent labs & imaging results that were available during my care of the patient were reviewed by me and considered in my medical decision making (see chart for details).        Symptoms consistent with cervical radiculopathy.  Will screen with x-rays and treat symptomatically. On reevaluation patient is now complaining of shortness of breath.  Few scattered rhonchi and wheezes.  Chest x-ray concerning for pulmonary edema.  Laboratory tests ordered. Increasing shortness of breath.  Oxygen saturations dropped into the 80s.  Placed on supplemental oxygen. Given IV Lasix and patient starting to diurese.  Appears to be more comfortable.  Significantly elevated blood pressure.  Will start on IV nitroglycerin.  Discussed with hospitalist who will see patient emergency department and admit. Final Clinical Impressions(s) / ED Diagnoses   Final diagnoses:  Cervical radiculopathy  Acute pulmonary edema [facility])    ED Discharge Orders    None       Julianne Rice, MD 06/08/19 2023

## 2019-06-08 NOTE — ED Notes (Signed)
ED TO INPATIENT HANDOFF REPORT  ED Nurse Name and Phone #:  Montez Hageman, RN [phone]  S Name/Age/Gender [name] 85 y.o. female Room/Bed: WA16/WA16  Code Status   Code Status: Full Code  Home/SNF/Other Home Patient oriented to: self, place, time and situation Is this baseline? Yes   Triage Complete: Triage complete  Chief Complaint arm, shoulder pain  Triage Note Pt states she is having left arm pain, running from her shoulder down to her wrist. Denies injury/trauma   Allergies Allergies  Allergen Reactions  . Aspirin Itching  . Penicillins Itching and Rash    DID THE REACTION INVOLVE: Swelling of the face/tongue/throat, SOB, or low BP? y Sudden or severe rash/hives, skin peeling, or the inside of the mouth or nose? n Did it require medical treatment? yes When did it last happen?more than 10 years If all above answers are "NO", may proceed with cephalosporin use.     Level of Care/Admitting Diagnosis ED Disposition    ED Disposition Condition [facility] Area: [place] [[id number]]  Level of Care: Telemetry [5]  Admit to tele based on following criteria: Acute CHF  Covid Evaluation: Asymptomatic Screening Protocol (No Symptoms)  Diagnosis: Acute on chronic diastolic CHF (congestive heart failure) ([place]) [371696]  Admitting Physician: Neena Rhymes [[id number]]  Attending Physician: Adella Hare E [[id number]]  PT Class (Do Not Modify): Observation [104]  PT Acc Code (Do Not Modify): Observation [10022]       B Medical/Surgery History Past Medical History:  Diagnosis Date  . Anemia   . Arthritis   . Cancer ([place])   . Cataract   . Chronic diastolic CHF (congestive heart failure) ([place])   . CKD (chronic kidney disease), stage III ([place])   . Clotting disorder ([place])   . CVA (cerebral vascular accident) ([place])    residual right sided weakness and mild dysphagia  . Diabetes mellitus ([place])   . Dizziness and giddiness   . DVT  (deep venous thrombosis) ([place])    a. On Coumadin for this.  . Essential hypertension   . LBBB (left bundle branch block)   . Mitral regurgitation    a. Mod MR by echo 2014.  . Muscular deconditioning   . Obesity   . SVT (supraventricular tachycardia) ([place])    a. In 2013 she had an EPS with ablation for SVT which did not eliminate the SVT completely. She also had bradycardia which limited medication. She was placed on amiodarone by Dr. Lovena Le.  . Thrombocytopenia ([place]) 11/21/2011   Past Surgical History:  Procedure Laterality Date  . EYE SURGERY    . SUPRAVENTRICULAR TACHYCARDIA ABLATION N/A 10/11/2012   Procedure: SUPRAVENTRICULAR TACHYCARDIA ABLATION;  Surgeon: Evans Lance, MD;  Location[facility] CATH LAB;  Service: Cardiovascular;  Laterality: N/A;     A IV Location/Drains/Wounds Patient Lines/Drains/Airways Status   Active Line/Drains/Airways    Name:   Placement date:   Placement time:   Site:   Days:   Peripheral IV 06/08/19 Left Antecubital   06/08/19    1925    Antecubital   less than 1   Pressure Injury 01/02/19 Stage II -  Partial thickness loss of dermis presenting as a shallow open ulcer with a red, pink wound bed without slough.   01/02/19    0430     157          Intake/Output Last 24 hours  Intake/Output Summary (Last 24 hours) at 06/08/2019 2247  Last data filed at 06/08/2019 2015 Gross per 24 hour  Intake -  Output 850 ml  Net -850 ml    Labs/Imaging Results for orders placed or performed during the hospital encounter of 06/08/19 (from the past 48 hour(s))  CBC with Differential/Platelet     Status: Abnormal   Collection Time: 06/08/19  6:46 PM  Result Value Ref Range   WBC 3.0 (L) 4.0 - 10.5 K/uL   RBC 3.47 (L) 3.87 - 5.11 MIL/uL   Hemoglobin 10.5 (L) 12.0 - 15.0 g/dL   HCT 33.5 (L) 36.0 - 46.0 %   MCV 96.5 80.0 - 100.0 fL   MCH 30.3 26.0 - 34.0 pg   MCHC 31.3 30.0 - 36.0 g/dL   RDW 14.3 11.5 - 15.5 %   Platelets 118 (L) 150 - 400 K/uL    Comment:  REPEATED TO VERIFY PLATELET COUNT CONFIRMED BY SMEAR SPECIMEN CHECKED FOR CLOTS Immature Platelet Fraction may be clinically indicated, consider ordering this additional test [id number]    nRBC 0.0 0.0 - 0.2 %   Neutrophils Relative % 40 %   Neutro Abs 1.2 (L) 1.7 - 7.7 K/uL   Lymphocytes Relative 42 %   Lymphs Abs 1.2 0.7 - 4.0 K/uL   Monocytes Relative 11 %   Monocytes Absolute 0.3 0.1 - 1.0 K/uL   Eosinophils Relative 6 %   Eosinophils Absolute 0.2 0.0 - 0.5 K/uL   Basophils Relative 1 %   Basophils Absolute 0.0 0.0 - 0.1 K/uL   Immature Granulocytes 0 %   Abs Immature Granulocytes 0.00 0.00 - 0.07 K/uL    Comment: Performed at [facility], [place] [address], [place], [place] [address]  Comprehensive metabolic panel     Status: Abnormal   Collection Time: 06/08/19  6:46 PM  Result Value Ref Range   Sodium 139 135 - 145 mmol/L   Potassium 4.2 3.5 - 5.1 mmol/L   Chloride 103 98 - 111 mmol/L   CO2 26 22 - 32 mmol/L   Glucose, Bld 87 70 - 99 mg/dL   BUN 28 (H) 8 - 23 mg/dL   Creatinine, Ser 1.43 (H) 0.44 - 1.00 mg/dL   Calcium 8.8 (L) 8.9 - 10.3 mg/dL   Total Protein 8.1 6.5 - 8.1 g/dL   Albumin 3.8 3.5 - 5.0 g/dL   AST 28 15 - 41 U/L   ALT 30 0 - 44 U/L   Alkaline Phosphatase 95 38 - 126 U/L   Total Bilirubin 0.6 0.3 - 1.2 mg/dL   GFR calc non Af Amer 34 (L) >60 mL/min   GFR calc Af Amer 39 (L) >60 mL/min   Anion gap 10 5 - 15    Comment: Performed at [facility], [place] [address] [place], [place] [address]  Brain natriuretic peptide     Status: Abnormal   Collection Time: 06/08/19  6:46 PM  Result Value Ref Range   B Natriuretic Peptide 557.6 (H) 0.0 - 100.0 pg/mL    Comment: Performed at [facility], [place] [address], [place], [place] [address]  Troponin I (High Sensitivity)     Status: Abnormal   Collection Time: 06/08/19  6:46 PM  Result Value Ref Range   Troponin I (High Sensitivity) 58 (H) <18 ng/L     Comment: (NOTE) Elevated high sensitivity troponin I (hsTnI) values and significant  changes across serial measurements may suggest ACS but many other  chronic and acute conditions are known to elevate  hsTnI results.  Refer to the "Links" section for chest pain algorithms and additional  guidance. Performed at [facility], [place] [address], [place], [place] [address]   SARS Coronavirus 2 [facility] order, Performed in [facility] hospital lab) Nasopharyngeal Nasopharyngeal Swab     Status: None   Collection Time: 06/08/19  8:04 PM   Specimen: Nasopharyngeal Swab  Result Value Ref Range   SARS Coronavirus 2 NEGATIVE NEGATIVE    Comment: (NOTE) If result is NEGATIVE SARS-CoV-2 target nucleic acids are NOT DETECTED. The SARS-CoV-2 RNA is generally detectable in upper and lower  respiratory specimens during the acute phase of infection. The lowest  concentration of SARS-CoV-2 viral copies this assay can detect is 250  copies / mL. A negative result does not preclude SARS-CoV-2 infection  and should not be used as the sole basis for treatment or other  patient management decisions.  A negative result may occur with  improper specimen collection / handling, submission of specimen other  than nasopharyngeal swab, presence of viral mutation(s) within the  areas targeted by this assay, and inadequate number of viral copies  (<250 copies / mL). A negative result must be combined with clinical  observations, patient history, and epidemiological information. If result is POSITIVE SARS-CoV-2 target nucleic acids are DETECTED. The SARS-CoV-2 RNA is generally detectable in upper and lower  respiratory specimens dur ing the acute phase of infection.  Positive  results are indicative of active infection with SARS-CoV-2.  Clinical  correlation with patient history and other diagnostic information is  necessary to determine patient infection status.  Positive results do  not rule  out bacterial infection or co-infection with other viruses. If result is PRESUMPTIVE POSTIVE SARS-CoV-2 nucleic acids MAY BE PRESENT.   A presumptive positive result was obtained on the submitted specimen  and confirmed on repeat testing.  While 2019 novel coronavirus  (SARS-CoV-2) nucleic acids may be present in the submitted sample  additional confirmatory testing may be necessary for epidemiological  and / or clinical management purposes  to differentiate between  SARS-CoV-2 and other Sarbecovirus currently known to infect humans.  If clinically indicated additional testing with an alternate test  methodology [phone]) is advised. The SARS-CoV-2 RNA is generally  detectable in upper and lower respiratory sp ecimens during the acute  phase of infection. The expected result is Negative. Fact Sheet for Patients:  StrictlyIdeas.no Fact Sheet for Healthcare Providers: BankingDealers.co.za This test is not yet approved or cleared by the [place] FDA and has been authorized for detection and/or diagnosis of SARS-CoV-2 by FDA under an Emergency Use Authorization (EUA).  This EUA will remain in effect (meaning this test can be used) for the duration of the COVID-19 declaration under Section 564(b)(1) of the Act, 21 U.S.C. section 360bbb-3(b)(1), unless the authorization is terminated or revoked sooner. Performed at [facility], [place] [address], [place],  [address]   Troponin I (High Sensitivity)     Status: Abnormal   Collection Time: 06/08/19  8:37 PM  Result Value Ref Range   Troponin I (High Sensitivity) 111 (HH) <18 ng/L    Comment: CRITICAL RESULT CALLED TO, READ BACK BY AND VERIFIED WITH: A,BULLCKY AT 2208 ON 06/08/19 BY A,MOHAMED (NOTE) Elevated high sensitivity troponin I (hsTnI) values and significant  changes across serial measurements may suggest ACS but many other  chronic and acute conditions are  known to elevate hsTnI results.  Refer to the Links section for chest pain algorithms and  additional  guidance. Performed at [facility], [place] [address] [place], [place] [address]   Hemoglobin A1c     Status: Abnormal   Collection Time: 06/08/19  8:37 PM  Result Value Ref Range   Hgb A1c MFr Bld 7.9 (H) 4.8 - 5.6 %    Comment: (NOTE) Pre diabetes:          5.7%-6.4% Diabetes:              >6.4% Glycemic control for   <7.0% adults with diabetes    Mean Plasma Glucose 180.03 mg/dL    Comment: Performed at [place] [address] [place], [place] [address]  CBG monitoring, ED     Status: None   Collection Time: 06/08/19 10:28 PM  Result Value Ref Range   Glucose-Capillary 79 70 - 99 mg/dL   Dg Chest 2 View  Result Date: 06/08/2019 CLINICAL DATA:  LEFT neck, shoulder and arm pain. Pacemaker placed 1 month ago. Wheezing. EXAM: CHEST - 2 VIEW COMPARISON:  Chest x-ray dated 11/02/2013. FINDINGS: Mild cardiomegaly. LEFT chest wall dual lead pacemaker in place. Central pulmonary vascular congestion. Prominent interstitial markings bilaterally, presumably edema. No pleural effusion or pneumothorax seen. No acute appearing osseous abnormality. IMPRESSION: Cardiomegaly with central pulmonary vascular congestion and bilateral interstitial prominence, presumably edema, suggesting CHF/volume overload. Electronically Signed   By: Franki Cabot M.D.   On: 06/08/2019 18:30   Dg Cervical Spine Complete  Result Date: 06/08/2019 CLINICAL DATA:  Pain EXAM: CERVICAL SPINE - COMPLETE 4+ VIEW COMPARISON:  None. FINDINGS: There is no prevertebral soft tissue swelling. There appears to be grade 1 anterolisthesis of C5 on C6 and C6 on C7. These levels are difficult to evaluate secondary to overlapping soft tissues on the lateral view. There is mild bilateral osseous neural foraminal narrowing. IMPRESSION: 1. No acute displaced fracture. 2. Mild-to-moderate multilevel degenerative  changes are noted of the cervical spine. 3. Possible grade 1 anterolisthesis of C5 on C6 and C6 on C7. Electronically Signed   By: Constance Holster M.D.   On: 06/08/2019 17:40   Dg Shoulder Left  Result Date: 06/08/2019 CLINICAL DATA:  Neck and shoulder pain. EXAM: LEFT SHOULDER - 2+ VIEW COMPARISON:  None. FINDINGS: There is no acute displaced fracture or dislocation. There are degenerative changes of the glenohumeral and acromioclavicular joints. There is some superior subluxation of the humeral head which is presumably chronic. IMPRESSION: No acute osseous abnormality. Electronically Signed   By: Constance Holster M.D.   On: 06/08/2019 17:38    Pending Labs Unresulted Labs (From admission, onward)    Start     Ordered   06/09/19 4431  Basic metabolic panel  Daily,   R     06/08/19 2037          Vitals/Pain Today's Vitals   06/08/19 2100 06/08/19 2130 06/08/19 2200 06/08/19 2220  BP: (!) 159/92 (!) 162/92 (!) 157/87   Pulse: 67  74   Resp: (!) 24 20 (!) 22   Temp:      TempSrc:      SpO2: 100%  99%   Weight:      PainSc:    5     Isolation Precautions No active isolations  Medications Medications  anastrozole (ARIMIDEX) tablet 1 mg (has no administration in time range)  amiodarone (PACERONE) tablet 200 mg (has no administration in time range)  atorvastatin (LIPITOR) tablet 20 mg (has no administration in time range)  Insulin Detemir (  LEVEMIR) FlexPen 10-16 Units (has no administration in time range)  magnesium oxide (MAG-OX) tablet 400 mg (has no administration in time range)  pantoprazole (PROTONIX) EC tablet 40 mg (has no administration in time range)  oxybutynin (DITROPAN) tablet 5 mg (has no administration in time range)  apixaban (ELIQUIS) tablet 5 mg (has no administration in time range)  pregabalin (LYRICA) capsule 75 mg (has no administration in time range)  acetaminophen (TYLENOL) tablet 650 mg (has no administration in time range)  ondansetron (ZOFRAN)  injection 4 mg (has no administration in time range)  furosemide (LASIX) injection 40 mg (has no administration in time range)  lisinopril (ZESTRIL) tablet 2.5 mg (has no administration in time range)  insulin aspart (novoLOG) injection 0-20 Units (has no administration in time range)  furosemide (LASIX) 10 MG/ML injection (has no administration in time range)  carvedilol (COREG) tablet 25 mg (has no administration in time range)  isosorbide-hydrALAZINE (BIDIL) 20-37.5 MG per tablet 1 tablet (has no administration in time range)  HYDROcodone-acetaminophen (NORCO/VICODIN) 5-325 MG per tablet 1 tablet (1 tablet Oral Given 06/08/19 1826)  albuterol (VENTOLIN HFA) 108 (90 Base) MCG/ACT inhaler 2 puff (2 puffs Inhalation Given 06/08/19 1826)  furosemide (LASIX) injection 40 mg (40 mg Intravenous Given 06/08/19 1916)    Mobility walks with device High fall risk   Focused Assessments    R Recommendations: See Admitting Provider Note  Report given to: Kathi Der, RN  Additional Notes:

## 2019-06-09 ENCOUNTER — Observation Stay (HOSPITAL_COMMUNITY): Payer: Medicare Other

## 2019-06-09 ENCOUNTER — Observation Stay (HOSPITAL_BASED_OUTPATIENT_CLINIC_OR_DEPARTMENT_OTHER): Payer: Medicare Other

## 2019-06-09 DIAGNOSIS — M5412 Radiculopathy, cervical region: Secondary | ICD-10-CM | POA: Diagnosis present

## 2019-06-09 DIAGNOSIS — D638 Anemia in other chronic diseases classified elsewhere: Secondary | ICD-10-CM | POA: Diagnosis present

## 2019-06-09 DIAGNOSIS — I5041 Acute combined systolic (congestive) and diastolic (congestive) heart failure: Secondary | ICD-10-CM | POA: Diagnosis not present

## 2019-06-09 DIAGNOSIS — Z7901 Long term (current) use of anticoagulants: Secondary | ICD-10-CM | POA: Diagnosis not present

## 2019-06-09 DIAGNOSIS — E1122 Type 2 diabetes mellitus with diabetic chronic kidney disease: Secondary | ICD-10-CM | POA: Diagnosis present

## 2019-06-09 DIAGNOSIS — I340 Nonrheumatic mitral (valve) insufficiency: Secondary | ICD-10-CM | POA: Diagnosis not present

## 2019-06-09 DIAGNOSIS — E669 Obesity, unspecified: Secondary | ICD-10-CM | POA: Diagnosis present

## 2019-06-09 DIAGNOSIS — Z794 Long term (current) use of insulin: Secondary | ICD-10-CM | POA: Diagnosis not present

## 2019-06-09 DIAGNOSIS — G629 Polyneuropathy, unspecified: Secondary | ICD-10-CM | POA: Diagnosis present

## 2019-06-09 DIAGNOSIS — I5033 Acute on chronic diastolic (congestive) heart failure: Secondary | ICD-10-CM

## 2019-06-09 DIAGNOSIS — Z6835 Body mass index (BMI) 35.0-35.9, adult: Secondary | ICD-10-CM | POA: Diagnosis not present

## 2019-06-09 DIAGNOSIS — I69351 Hemiplegia and hemiparesis following cerebral infarction affecting right dominant side: Secondary | ICD-10-CM | POA: Diagnosis not present

## 2019-06-09 DIAGNOSIS — I447 Left bundle-branch block, unspecified: Secondary | ICD-10-CM | POA: Diagnosis present

## 2019-06-09 DIAGNOSIS — Z20828 Contact with and (suspected) exposure to other viral communicable diseases: Secondary | ICD-10-CM | POA: Diagnosis present

## 2019-06-09 DIAGNOSIS — I10 Essential (primary) hypertension: Secondary | ICD-10-CM | POA: Diagnosis not present

## 2019-06-09 DIAGNOSIS — I361 Nonrheumatic tricuspid (valve) insufficiency: Secondary | ICD-10-CM | POA: Diagnosis not present

## 2019-06-09 DIAGNOSIS — J810 Acute pulmonary edema: Secondary | ICD-10-CM | POA: Diagnosis not present

## 2019-06-09 DIAGNOSIS — I130 Hypertensive heart and chronic kidney disease with heart failure and stage 1 through stage 4 chronic kidney disease, or unspecified chronic kidney disease: Secondary | ICD-10-CM | POA: Diagnosis present

## 2019-06-09 DIAGNOSIS — C50919 Malignant neoplasm of unspecified site of unspecified female breast: Secondary | ICD-10-CM | POA: Diagnosis present

## 2019-06-09 DIAGNOSIS — K224 Dyskinesia of esophagus: Secondary | ICD-10-CM | POA: Diagnosis present

## 2019-06-09 DIAGNOSIS — Z880 Allergy status to penicillin: Secondary | ICD-10-CM | POA: Diagnosis not present

## 2019-06-09 DIAGNOSIS — N183 Chronic kidney disease, stage 3 (moderate): Secondary | ICD-10-CM | POA: Diagnosis not present

## 2019-06-09 DIAGNOSIS — E785 Hyperlipidemia, unspecified: Secondary | ICD-10-CM | POA: Diagnosis present

## 2019-06-09 DIAGNOSIS — M79603 Pain in arm, unspecified: Secondary | ICD-10-CM | POA: Diagnosis present

## 2019-06-09 DIAGNOSIS — I081 Rheumatic disorders of both mitral and tricuspid valves: Secondary | ICD-10-CM | POA: Diagnosis present

## 2019-06-09 DIAGNOSIS — N184 Chronic kidney disease, stage 4 (severe): Secondary | ICD-10-CM | POA: Diagnosis present

## 2019-06-09 DIAGNOSIS — N179 Acute kidney failure, unspecified: Secondary | ICD-10-CM | POA: Diagnosis present

## 2019-06-09 DIAGNOSIS — D631 Anemia in chronic kidney disease: Secondary | ICD-10-CM | POA: Diagnosis present

## 2019-06-09 DIAGNOSIS — Z79899 Other long term (current) drug therapy: Secondary | ICD-10-CM | POA: Diagnosis not present

## 2019-06-09 DIAGNOSIS — Z86718 Personal history of other venous thrombosis and embolism: Secondary | ICD-10-CM | POA: Diagnosis not present

## 2019-06-09 DIAGNOSIS — I509 Heart failure, unspecified: Secondary | ICD-10-CM

## 2019-06-09 DIAGNOSIS — D696 Thrombocytopenia, unspecified: Secondary | ICD-10-CM | POA: Diagnosis present

## 2019-06-09 DIAGNOSIS — I272 Pulmonary hypertension, unspecified: Secondary | ICD-10-CM | POA: Diagnosis present

## 2019-06-09 LAB — BASIC METABOLIC PANEL
Anion gap: 11 (ref 5–15)
BUN: 28 mg/dL — ABNORMAL HIGH (ref 8–23)
CO2: 26 mmol/L (ref 22–32)
Calcium: 8.6 mg/dL — ABNORMAL LOW (ref 8.9–10.3)
Chloride: 102 mmol/L (ref 98–111)
Creatinine, Ser: 1.29 mg/dL — ABNORMAL HIGH (ref 0.44–1.00)
GFR calc Af Amer: 44 mL/min — ABNORMAL LOW (ref >60)
GFR calc non Af Amer: 38 mL/min — ABNORMAL LOW (ref >60)
Glucose, Bld: 180 mg/dL — ABNORMAL HIGH (ref 70–99)
Potassium: 3.9 mmol/L (ref 3.5–5.1)
Sodium: 139 mmol/L (ref 135–145)

## 2019-06-09 LAB — ECHOCARDIOGRAM COMPLETE
Height: 60 in
Weight: 3001.78 oz

## 2019-06-09 LAB — GLUCOSE, CAPILLARY
Glucose-Capillary: 173 mg/dL — ABNORMAL HIGH (ref 70–99)
Glucose-Capillary: 255 mg/dL — ABNORMAL HIGH (ref 70–99)
Glucose-Capillary: 85 mg/dL (ref 70–99)
Glucose-Capillary: 133 mg/dL — ABNORMAL HIGH (ref 70–99)

## 2019-06-09 LAB — TROPONIN I (HIGH SENSITIVITY)
Troponin I (High Sensitivity): 159 ng/L (ref <18)
Troponin I (High Sensitivity): 257 ng/L (ref <18)
Troponin I (High Sensitivity): 310 ng/L (ref <18)

## 2019-06-09 LAB — D-DIMER, QUANTITATIVE: D-Dimer, Quant: 1.04 ug/mL-FEU — ABNORMAL HIGH (ref 0.00–0.50)

## 2019-06-09 LAB — BRAIN NATRIURETIC PEPTIDE: B Natriuretic Peptide: 888.9 pg/mL — ABNORMAL HIGH (ref 0.0–100.0)

## 2019-06-09 MED ORDER — INSULIN DETEMIR 100 UNIT/ML ~~LOC~~ SOLN
16.0000 [IU] | Freq: Every day | SUBCUTANEOUS | Status: DC
  Administered 2019-06-09 – 2019-06-11 (×3): 16 [IU] via SUBCUTANEOUS
  Filled 2019-06-09 (×3): qty 0.16

## 2019-06-09 MED ORDER — INSULIN DETEMIR 100 UNIT/ML ~~LOC~~ SOLN
8.0000 [IU] | Freq: Every day | SUBCUTANEOUS | Status: DC
  Administered 2019-06-09 – 2019-06-10 (×2): 8 [IU] via SUBCUTANEOUS
  Filled 2019-06-09 (×3): qty 0.08

## 2019-06-09 MED ORDER — ISOSORB DINITRATE-HYDRALAZINE 20-37.5 MG PO TABS
1.0000 | ORAL_TABLET | Freq: Two times a day (BID) | ORAL | Status: DC
  Administered 2019-06-09 – 2019-06-11 (×5): 1 via ORAL
  Filled 2019-06-09 (×6): qty 1

## 2019-06-09 NOTE — Progress Notes (Signed)
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  Evaluation in the ED she was found to have pulmonary edema on chest x-ray and she was referred to admission for acute on chronic diastolic heart failure exacerbation. She was started on IV Lasix and cardiology was consulted.  Cardiology recommends to continue with IV Lasix, follow-up with a 2D echocardiogram and outpatient follow-up for interrogation of her PPM and suggest that the left arm pain is probably noncardiac in origin.    Assessment & Plan:   Active Problems:   Acute on chronic diastolic CHF (congestive heart failure) (HCC)   CKD (chronic kidney disease), stage III (HCC)   HTN (hypertension)   DM (diabetes mellitus) ([place])   Acute on chronic diastolic CHF:  Admitted for IV diuresis with IV lasix, replete electrolytes as needed.  Renal parameters are improving.  Echocardiogram ordered and pending.  Overnight telemetry does not show any ischemic changes.    SVT: S/p ablation, followed by bradycardia.  S/p PPM and as per Dr Lovena Le the device is working  and is on amiodarone for maintenance of her rhythm.  Hyperlipidemia:  Continue with lipitor.    Hypertension:  Suboptimally controlled. Pt reports non compliant to meds, hence restarted her meds. Continue with lisinopril 2.5 mg daily , and bidil 1 tab BID.   Stage 4 CKD:  Creatinine is better than on admission.  Continue to monitor.     Anemia of chronic disease:  No signs of rectal bleed or malena.    Mild thrombocytopenia.  Monitor.    H/o Breast  cancer on arimidex.   Diabetes Mellitus:  CBG (last 3)  Recent Labs    06/08/19 2228 06/09/19 0836  GLUCAP 79 173*   A1c if 7.9.  Resume levemir BID and SSI.     DVT prophylaxis: eliquis.  Code Status : full code.  Family Communication: none at bedside.  Disposition Plan: pending clinical improvement.    Consultants:   Cardiology Dr Lovena Le   Procedures: Echocardiogram.   Antimicrobials: none.    Subjective: Breathing better , no chest pain or sob, left shoulder pain is no more .  No nausea or vomiting.   Objective: Vitals:   06/09/19 0128 06/09/19 0500 06/09/19 0525 06/09/19 0535  BP: (!) 153/82  (!) 161/85   Pulse: 71  69   Resp: 20  20   Temp: 97.9 F (36.6 C)  98.7 F (37.1 C)   TempSrc: Oral  Oral   SpO2: 98%  93%   Weight:  86.8 kg  85.1 kg  Height:        Intake/Output Summary (Last 24 hours) at 06/09/2019 0939 Last data filed at 06/09/2019 0825 Gross per 24 hour  Intake 0 ml  Output 3250 ml  Net -3250 ml   Filed Weights   06/08/19 2310 06/09/19 0500 06/09/19 0535  Weight: 86.1 kg 86.8 kg 85.1 kg    Examination:  General exam: Appears calm and comfortable  Respiratory system: scattered rales, no wheezing. Diminished air entry at bases.  Cardiovascular system: S1 & S2 heard, leg edema present, paced.  Gastrointestinal system: Abdomen is soft NT nd bs+ Central nervous system: Alert and oriented. No focal neurological deficits. Extremities: Symmetric 5 x 5 power. Leg edema.  Skin: No rashes, lesions or ulcers Psychiatry:  Mood & affect appropriate.     Data Reviewed: I have personally reviewed following labs and imaging studies  CBC: Recent Labs  Lab 06/08/19 1846  WBC 3.0*  NEUTROABS 1.2*  HGB 10.5*  HCT 33.5*  MCV 96.5  PLT 161*   Basic Metabolic Panel: Recent Labs  Lab 06/08/19 1846 06/09/19 0206  NA 139 139  K 4.2 3.9  CL 103 102  CO2 26 26  GLUCOSE 87 180*  BUN 28* 28*  CREATININE 1.43* 1.29*  CALCIUM 8.8* 8.6*    GFR: Estimated Creatinine Clearance: 31.4 mL/min (A) (by C-G formula based on SCr of 1.29 mg/dL (H)). Liver Function Tests: Recent Labs  Lab 06/08/19 1846  AST 28  ALT 30  ALKPHOS 95  BILITOT 0.6  PROT 8.1  ALBUMIN 3.8   No results for input(s): LIPASE, AMYLASE in the last 168 hours. No results for input(s): AMMONIA in the last 168 hours. Coagulation Profile: No results for input(s): INR, PROTIME in the last 168 hours. Cardiac Enzymes: No results for input(s): CKTOTAL, CKMB, CKMBINDEX, TROPONINI in the last 168 hours. BNP (last 3 results) Recent Labs    07/06/18 1118 08/06/18 1244  PROBNP 1,500* 1,604*   HbA1C: Recent Labs    06/08/19 2037  HGBA1C 7.9*   CBG: Recent Labs  Lab 06/08/19 2228 06/09/19 0836  GLUCAP 79 173*   Lipid Profile: No results for input(s): CHOL, HDL, LDLCALC, TRIG, CHOLHDL, LDLDIRECT in the last 72 hours. Thyroid Function Tests: No results for input(s): TSH, T4TOTAL, FREET4, T3FREE, THYROIDAB in the last 72 hours. Anemia Panel: No results for input(s): VITAMINB12, FOLATE, FERRITIN, TIBC, IRON, RETICCTPCT in the last 72 hours. Sepsis Labs: No results for input(s): PROCALCITON, LATICACIDVEN in the last 168 hours.  Recent Results (from the past 240 hour(s))  SARS Coronavirus 2 [facility] order, Performed in [facility] hospital lab) Nasopharyngeal Nasopharyngeal Swab     Status: None   Collection Time: 06/08/19  8:04 PM   Specimen: Nasopharyngeal Swab  Result Value Ref Range Status   SARS Coronavirus 2 NEGATIVE NEGATIVE Final    Comment: (NOTE) If result is NEGATIVE SARS-CoV-2 target nucleic acids are NOT DETECTED. The SARS-CoV-2 RNA is generally detectable in upper and lower  respiratory specimens during the acute phase of infection. The lowest  concentration of SARS-CoV-2 viral copies this assay can detect is 250  copies / mL. A negative result does not preclude SARS-CoV-2 infection  and should not be used as the sole basis for  treatment or other  patient management decisions.  A negative result may occur with  improper specimen collection / handling, submission of specimen other  than nasopharyngeal swab, presence of viral mutation(s) within the  areas targeted by this assay, and inadequate number of viral copies  (<250 copies / mL). A negative result must be combined with clinical  observations, patient history, and epidemiological information. If result is POSITIVE SARS-CoV-2 target nucleic acids are DETECTED. The SARS-CoV-2 RNA is generally detectable in upper and lower  respiratory specimens dur ing the acute phase of infection.  Positive  results are indicative of active infection with SARS-CoV-2.  Clinical  correlation with patient history and other diagnostic information is  necessary to determine patient infection status.  Positive results do  not rule out bacterial infection or  co-infection with other viruses. If result is PRESUMPTIVE POSTIVE SARS-CoV-2 nucleic acids MAY BE PRESENT.   A presumptive positive result was obtained on the submitted specimen  and confirmed on repeat testing.  While 2019 novel coronavirus  (SARS-CoV-2) nucleic acids may be present in the submitted sample  additional confirmatory testing may be necessary for epidemiological  and / or clinical management purposes  to differentiate between  SARS-CoV-2 and other Sarbecovirus currently known to infect humans.  If clinically indicated additional testing with an alternate test  methodology [phone]) is advised. The SARS-CoV-2 RNA is generally  detectable in upper and lower respiratory sp ecimens during the acute  phase of infection. The expected result is Negative. Fact Sheet for Patients:  StrictlyIdeas.no Fact Sheet for Healthcare Providers: BankingDealers.co.za This test is not yet approved or cleared by the [place] FDA and has been authorized for detection and/or  diagnosis of SARS-CoV-2 by FDA under an Emergency Use Authorization (EUA).  This EUA will remain in effect (meaning this test can be used) for the duration of the COVID-19 declaration under Section 564(b)(1) of the Act, 21 U.S.C. section 360bbb-3(b)(1), unless the authorization is terminated or revoked sooner. Performed at [facility], [place] [address] [place], [place] [address]          Radiology Studies: Dg Chest 2 View  Result Date: 06/08/2019 CLINICAL DATA:  LEFT neck, shoulder and arm pain. Pacemaker placed 1 month ago. Wheezing. EXAM: CHEST - 2 VIEW COMPARISON:  Chest x-ray dated 11/02/2013. FINDINGS: Mild cardiomegaly. LEFT chest wall dual lead pacemaker in place. Central pulmonary vascular congestion. Prominent interstitial markings bilaterally, presumably edema. No pleural effusion or pneumothorax seen. No acute appearing osseous abnormality. IMPRESSION: Cardiomegaly with central pulmonary vascular congestion and bilateral interstitial prominence, presumably edema, suggesting CHF/volume overload. Electronically Signed   By: Franki Cabot M.D.   On: 06/08/2019 18:30   Dg Cervical Spine Complete  Result Date: 06/08/2019 CLINICAL DATA:  Pain EXAM: CERVICAL SPINE - COMPLETE 4+ VIEW COMPARISON:  None. FINDINGS: There is no prevertebral soft tissue swelling. There appears to be grade 1 anterolisthesis of C5 on C6 and C6 on C7. These levels are difficult to evaluate secondary to overlapping soft tissues on the lateral view. There is mild bilateral osseous neural foraminal narrowing. IMPRESSION: 1. No acute displaced fracture. 2. Mild-to-moderate multilevel degenerative changes are noted of the cervical spine. 3. Possible grade 1 anterolisthesis of C5 on C6 and C6 on C7. Electronically Signed   By: Constance Holster M.D.   On: 06/08/2019 17:40   Dg Chest Port 1 View  Result Date: 06/09/2019 CLINICAL DATA:  Congestive heart failure EXAM: PORTABLE CHEST 1 VIEW COMPARISON:   06/08/2019 FINDINGS: Unchanged position of left chest wall pacemaker leads. Improved aeration of both lungs. Unchanged mild cardiomegaly. No pleural effusion or pneumothorax. IMPRESSION: Improved aeration of both lungs. Electronically Signed   By: Ulyses Jarred M.D.   On: 06/09/2019 05:49   Dg Shoulder Left  Result Date: 06/08/2019 CLINICAL DATA:  Neck and shoulder pain. EXAM: LEFT SHOULDER - 2+ VIEW COMPARISON:  None. FINDINGS: There is no acute displaced fracture or dislocation. There are degenerative changes of the glenohumeral and acromioclavicular joints. There is some superior subluxation of the humeral head which is presumably chronic. IMPRESSION: No acute osseous abnormality. Electronically Signed   By: Constance Holster M.D.   On: 06/08/2019 17:38        Scheduled Meds: . amiodarone  200 mg Oral Daily  . anastrozole  1 mg Oral Daily  . apixaban  5 mg Oral BID  . atorvastatin  20 mg Oral QHS  . carvedilol  25 mg Oral BID  . furosemide  40 mg Intravenous Q6H  . insulin aspart  0-20 Units Subcutaneous TID WC  . insulin detemir  16 Units Subcutaneous Daily  . insulin detemir  8 Units Subcutaneous QHS  . isosorbide-hydrALAZINE  1 tablet Oral BID  . lisinopril  2.5 mg Oral Daily  . magnesium oxide  400 mg Oral Daily  . oxybutynin  5 mg Oral BID  . pantoprazole  40 mg Oral Daily  . pregabalin  75 mg Oral QHS   Continuous Infusions:   LOS: 0 days    Time spent: [place]    Hosie Poisson, MD [facility] Pager [phone]   If 7PM-7AM, please contact night-coverage [email or web address] Password [facility] 06/09/2019, 9:39 AM

## 2019-06-09 NOTE — Progress Notes (Signed)
CRITICAL VALUE ALERT  Critical Value:  Troponin 310  Date & Time Notied:  6/16  Orders Received/Actions taken: continue to cycle troponin, cardiology consult, pt asymptomatic.

## 2019-06-09 NOTE — Evaluation (Signed)
Physical Therapy Evaluation Patient Details Name: [name] MRN: [id number] DOB: [birth year] Today's Date: 06/09/2019   History of Present Illness  85 yo female admitted with CHF, L shoulder pain. Hx of PPM, DVT, CKD, DM, breast cancer  Clinical Impression  On eval, pt required Min assist for mobility. She walked ~60 feet with a RW. Pt c/o L UE pain with activity-unsure of cause. Discussed d/c plan-pt plans to return home where she lives with her daughter. Will follow and progress activity as tolerated.     Follow Up Recommendations Home health PT;Supervision/Assistance - 24 hour    Equipment Recommendations  None recommended by PT    Recommendations for Other Services       Precautions / Restrictions Precautions Precautions: Fall Restrictions Weight Bearing Restrictions: No      Mobility  Bed Mobility               General bed mobility comments: oob in recliner  Transfers Overall transfer level: Needs assistance Equipment used: Rolling walker (2 wheeled) Transfers: Sit to/from Stand Sit to Stand: Min assist         General transfer comment: Assist to steady.  Ambulation/Gait Ambulation/Gait assistance: Min assist Gait Distance (Feet): 60 Feet Assistive device: Rolling walker (2 wheeled) Gait Pattern/deviations: Step-through pattern;Decreased stride length     General Gait Details: Assist to steady throughout distance. Pt fatigues fairly easily. She c/o discomfort/pain along the entire L arm  Stairs            Wheelchair Mobility    Modified Rankin (Stroke Patients Only)       Balance Overall balance assessment: Needs assistance         Standing balance support: Bilateral upper extremity supported Standing balance-Leahy Scale: Poor                               Pertinent Vitals/Pain Pain Assessment: Faces Faces Pain Scale: Hurts even more Pain Location: L shoulder with activity Pain Descriptors / Indicators:  Sore Pain Intervention(s): Monitored during session    Home Living Family/patient expects to be discharged to:: Private residence Living Arrangements: Children Available Help at Discharge: Family;Available PRN/intermittently Type of Home: Apartment Home Access: Level entry     Home Layout: One level Home Equipment: Walker - 4 wheels;Wheelchair - [employer] - 2 wheels      Prior Function Level of Independence: Needs assistance   Gait / Transfers Assistance Needed: ambulatory with RW vs wheelchair  ADL's / Homemaking Assistance Needed: assistance for bathing, dressing        Hand Dominance        Extremity/Trunk Assessment   Upper Extremity Assessment Upper Extremity Assessment: Generalized weakness    Lower Extremity Assessment Lower Extremity Assessment: Generalized weakness    Cervical / Trunk Assessment Cervical / Trunk Assessment: Kyphotic  Communication   Communication: No difficulties  Cognition Arousal/Alertness: Awake/alert Behavior During Therapy: WFL for tasks assessed/performed Overall Cognitive Status: Within Functional Limits for tasks assessed                                        General Comments      Exercises     Assessment/Plan    PT Assessment Patient needs continued PT services  PT Problem List Decreased strength;Decreased mobility;Decreased activity tolerance;Decreased balance;Decreased knowledge of use of DME;Pain  PT Treatment Interventions DME instruction;Gait training;Therapeutic exercise;Therapeutic activities;Patient/family education;Balance training;Functional mobility training    PT Goals (Current goals can be found in the Care Plan section)  Acute Rehab PT Goals Patient Stated Goal: to get better PT Goal Formulation: With patient Time For Goal Achievement: 06/23/19 Potential to Achieve Goals: Good    Frequency Min 3X/week   Barriers to discharge        Co-evaluation                AM-PAC PT "6 Clicks" Mobility  Outcome Measure Help needed turning from your back to your side while in a flat bed without using bedrails?: A Little Help needed moving from lying on your back to sitting on the side of a flat bed without using bedrails?: A Little Help needed moving to and from a bed to a chair (including a wheelchair)?: A Little Help needed standing up from a chair using your arms (e.g., wheelchair or bedside chair)?: A Little Help needed to walk in hospital room?: A Little Help needed climbing 3-5 steps with a railing? : A Little 6 Click Score: 18    End of Session Equipment Utilized During Treatment: Gait belt Activity Tolerance: Patient tolerated treatment well Patient left: in chair;with call bell/phone within reach   PT Visit Diagnosis: Muscle weakness (generalized) (M62.81);Unsteadiness on feet (R26.81);Pain Pain - Right/Left: Left Pain - part of body: Shoulder    Time: 1549-1600 PT Time Calculation (min) (ACUTE ONLY): 11 min   Charges:   PT Evaluation $PT Eval Moderate Complexity: [place], PT Acute Rehabilitation Services Pager: [phone] Office: [phone]

## 2019-06-09 NOTE — Progress Notes (Signed)
CRITICAL VALUE ALERT  Critical Value:  Troponin 257  Date & Time Notied:  06/09/19   Provider Notified: Silas [place], NP  Orders Received/Actions taken new order for lab draws

## 2019-06-09 NOTE — Consult Note (Signed)
Cardiology Consultation:   Patient ID: [name] MRN: [id number]; DOB: 11-Aug-1933  Admit date: 06/08/2019 Date of Consult: 06/09/2019  Primary Care Provider: [name], MD Primary Cardiologist: Larae Grooms, MD  Primary Electrophysiologist:  None Lovena Le   Patient Profile:   [name] is a 85 y.o. female with a hx of SVT who is being seen today for the evaluation of sob and arm pain at the request of Dr. Karleen Hampshire.  History of Present Illness:   [name] is well known to our EP service. She had SVT years ago and underwent catheter ablation. She had 2 distinct slow pathways and underwent ablation of both. She had recurrent SVT on cardiac monitor but declined ablation. She had moved to [place] and was found to have recurrent syncope and underwent PPM insertion last month. She has developed breast CA and is pending surgery. She moved back to [place] to be closer to her daughter. She has been out of her meds. Note she became short of breath in the ED last night. She actually presented with left arm pain. Evaluation has demonstrated an elevated BNP and a minimally elevated troponin. Her arm pain is not cardiac. Her ECG demonstrates p synchronous ventricular pacing and cannot be utilized to assess ischemia. She feels better this morning.   Heart Pathway Score:     Past Medical History:  Diagnosis Date  . Anemia   . Arthritis   . Cancer ([place])   . Cataract   . Chronic diastolic CHF (congestive heart failure) ([place])   . CKD (chronic kidney disease), stage III ([place])   . Clotting disorder ([place])   . CVA (cerebral vascular accident) ([place])    residual right sided weakness and mild dysphagia  . Diabetes mellitus ([place])   . Dizziness and giddiness   . DVT (deep venous thrombosis) ([place])    a. On Coumadin for this.  . Essential hypertension   . LBBB (left bundle branch block)   . Mitral regurgitation    a. Mod MR by echo 2014.  . Muscular deconditioning   . Obesity   . SVT  (supraventricular tachycardia) ([place])    a. In 2013 she had an EPS with ablation for SVT which did not eliminate the SVT completely. She also had bradycardia which limited medication. She was placed on amiodarone by Dr. Lovena Le.  . Thrombocytopenia ([place]) 11/21/2011    Past Surgical History:  Procedure Laterality Date  . EYE SURGERY    . SUPRAVENTRICULAR TACHYCARDIA ABLATION N/A 10/11/2012   Procedure: SUPRAVENTRICULAR TACHYCARDIA ABLATION;  Surgeon: Evans Lance, MD;  Location[facility] CATH LAB;  Service: Cardiovascular;  Laterality: N/A;     Home Medications:  Prior to Admission medications   Medication Sig Start Date End Date Taking? Authorizing Provider  acetaminophen (TYLENOL) 500 MG tablet Take 1,000 mg by mouth every 6 (six) hours as needed for mild pain, moderate pain or headache.    Yes [provider]  amiodarone (PACERONE) 200 MG tablet Take 1 tablet (200 mg total) by mouth daily. 10/05/18  Yes Jettie Booze, MD  anastrozole (ARIMIDEX) 1 MG tablet Take 1 tablet (1 mg total) by mouth daily. 11/28/18  Yes Nicholas Lose, MD  apixaban (ELIQUIS) 5 MG TABS tablet Take 1 tablet (5 mg total) by mouth 2 (two) times daily. 02/04/19  Yes [name], MD  atorvastatin (LIPITOR) 20 MG tablet Take 1 tablet (20 mg total) by mouth at bedtime. 02/04/19  Yes [name], MD  BD PEN  NEEDLE NANO U/F 32G X 4 MM MISC Use as directed 02/07/19  Yes [name], MD  bumetanide (BUMEX) 1 MG tablet Take 1 tablet (1 mg total) by mouth 2 (two) times daily. 1 and 1/2 tab in the am and 1 tab in the pm Patient taking differently: Take 1-1.5 mg by mouth See admin instructions. Take 1.5 mg in the am and 1mg  in the pm 06/03/19  Yes [name], MD  calcium carbonate (OS-CAL) 600 MG TABS Take 600 mg by mouth daily with breakfast.    Yes [provider]  carvedilol (COREG) 12.5 MG tablet Take 1 tablet (12.5 mg total) by mouth 2 (two) times daily. Patient taking differently: Take 25 mg by mouth  2 (two) times daily.  02/04/19 06/09/19 Yes [name], MD  diclofenac sodium (VOLTAREN) 1 % GEL Apply 2 g topically 4 (four) times daily. Patient taking differently: Apply 2 g topically 4 (four) times daily as needed (pain).  10/30/18  Yes Rodriguez-[place], Sunday Spillers, PA-C  ferrous sulfate 325 (65 FE) MG tablet Take 325 mg by mouth daily with breakfast.   Yes [provider]  glucose blood (ACCU-CHEK GUIDE) test strip Use as instructed to check blood sugars 3 times per day prn dx: e11.22 03/28/19  Yes [name], MD  HUMALOG KWIKPEN 200 UNIT/ML SOPN Inject 4-8 Units into the skin 3 (three) times daily. Per sliding scale 02/04/19  Yes [name], MD  isosorbide-hydrALAZINE (BIDIL) 20-37.5 MG tablet Take 1 tablet by mouth 2 (two) times daily.    Yes [provider]  LEVEMIR FLEXTOUCH 100 UNIT/ML Pen Inject 10-16 Units into the skin daily. 16 units in the am and 10 units in the evening Patient taking differently: Inject 8-16 Units into the skin daily. 16 units in the am and 8 units in the evening 05/29/19  Yes [name], MD  magnesium oxide (MAG-OX) 400 MG tablet Take 400 mg by mouth daily.   Yes [provider]  omeprazole (PRILOSEC) 40 MG capsule TAKE ONE CAPSULE BY MOUTH DAILY BEFORE A MEAL Patient taking differently: Take 40 mg by mouth daily before breakfast.  02/04/19  Yes [name], MD  oxybutynin (DITROPAN) 5 MG tablet Take 1 tablet (5 mg total) by mouth 2 (two) times daily. 05/13/19  Yes [name], MD  potassium chloride SA (K-DUR) 20 MEQ tablet Take 2 tablets (40 mEq total) by mouth daily. 03/07/19  Yes [name], MD  pregabalin (LYRICA) 75 MG capsule Take 1 capsule (75 mg total) by mouth at bedtime. 05/13/19  Yes [name], MD  traMADol (ULTRAM) 50 MG tablet Take 50 mg by mouth 2 (two) times daily as needed for pain. 12/21/18  Yes [provider]  doxycycline (VIBRAMYCIN) 100 MG capsule Take 1 capsule (100 mg total) by mouth 2 (two)  times daily. Patient not taking: Reported on 06/09/2019 05/21/19   [name], MD    Inpatient Medications: Scheduled Meds: . amiodarone  200 mg Oral Daily  . anastrozole  1 mg Oral Daily  . apixaban  5 mg Oral BID  . atorvastatin  20 mg Oral QHS  . carvedilol  25 mg Oral BID  . furosemide      . furosemide  40 mg Intravenous Q6H  . insulin aspart  0-20 Units Subcutaneous TID WC  . insulin detemir  16 Units Subcutaneous Daily  . insulin detemir  8 Units Subcutaneous QHS  . isosorbide-hydrALAZINE  1 tablet Oral BID  . lisinopril  2.5 mg Oral  Daily  . magnesium oxide  400 mg Oral Daily  . oxybutynin  5 mg Oral BID  . pantoprazole  40 mg Oral Daily  . pregabalin  75 mg Oral QHS   Continuous Infusions:  PRN Meds: acetaminophen, ondansetron (ZOFRAN) IV  Allergies:    Allergies  Allergen Reactions  . Aspirin Itching  . Penicillins Itching and Rash    DID THE REACTION INVOLVE: Swelling of the face/tongue/throat, SOB, or low BP? y Sudden or severe rash/hives, skin peeling, or the inside of the mouth or nose? n Did it require medical treatment? yes When did it last happen?more than 10 years If all above answers are "NO", may proceed with cephalosporin use.     Social History:   Social History   Socioeconomic History  . Marital status: Widowed    Spouse name: Not on file  . Number of children: Not on file  . Years of education: Not on file  . Highest education level: Not on file  Occupational History  . Occupation: retired  Scientific laboratory technician  . Financial resource strain: Not hard at all  . Food insecurity    Worry: Never true    Inability: Never true  . Transportation needs    Medical: No    Non-medical: No  Tobacco Use  . Smoking status: Never Smoker  . Smokeless tobacco: Never Used  Substance and Sexual Activity  . Alcohol use: No  . Drug use: No  . Sexual activity: Not Currently  Lifestyle  . Physical activity    Days per week: 0 days    Minutes per  session: 0 min  . Stress: Not at all  Relationships  . Social Herbalist on phone: Not on file    Gets together: Not on file    Attends religious service: Not on file    Active member of club or organization: Not on file    Attends meetings of clubs or organizations: Not on file    Relationship status: Not on file  . Intimate partner violence    Fear of current or ex partner: No    Emotionally abused: No    Physically abused: No    Forced sexual activity: No  Other Topics Concern  . Not on file  Social History Narrative   Lives at home alone   She has a nursing aid who comes to her home for 2.5 hrs/day x 4 days/week    Right handed   1 cup caffeine daily    Family History:    Family History  Problem Relation Age of Onset  . Stroke Mother   . Cancer Father        prostate  . Hypertension Daughter   . Heart attack Brother        x2  . Hypertension Brother   . Stroke Sister   . Hypertension Sister   . Breast cancer Maternal Aunt      ROS:  Please see the history of present illness.   All other ROS reviewed and negative.     Physical Exam/Data:   Vitals:   06/09/19 0128 06/09/19 0500 06/09/19 0525 06/09/19 0535  BP: (!) 153/82  (!) 161/85   Pulse: 71  69   Resp: 20  20   Temp: 97.9 F (36.6 C)  98.7 F (37.1 C)   TempSrc: Oral  Oral   SpO2: 98%  93%   Weight:  86.8 kg  85.1 kg  Height:  Intake/Output Summary (Last 24 hours) at 06/09/2019 0841 Last data filed at 06/09/2019 0825 Gross per 24 hour  Intake 0 ml  Output 3250 ml  Net -3250 ml   Last 3 Weights 06/09/2019 06/09/2019 06/08/2019  Weight (lbs) 187 lb 9.8 oz 191 lb 5.8 oz 189 lb 13.8 oz  Weight (kg) 85.1 kg 86.8 kg 86.12 kg     Body mass index is 36.64 kg/m.  General:  Elderly, well nourished, well developed, in no acute distress HEENT: normal Lymph: no adenopathy Neck: no JVD Endocrine:  No thryomegaly Vascular: No carotid bruits; FA pulses 2+ bilaterally without bruits   Cardiac:  normal S1, split S2; RRR; no murmur,  Lungs:  clear to auscultation bilaterally, no wheezing, rhonchi or rales  Abd: soft, nontender, no hepatomegaly  Ext: no edema, left arm is tender to palpation but is not swollen Musculoskeletal:  No deformities, BUE and BLE strength normal and equal Skin: warm and dry  Neuro:  CNs 2-12 intact, no focal abnormalities noted Psych:  Normal affect   EKG:  The EKG was personally reviewed and demonstrates:  P synchronous ventricular pacing Telemetry:  Telemetry was personally reviewed and demonstrates:  P synchronous ventricular pacing  Relevant CV Studies: none  Laboratory Data:  High Sensitivity Troponin:   Recent Labs  Lab 06/08/19 1846 06/08/19 2037 06/08/19 2350 06/09/19 0206  TROPONINIHS 58* 111* 257* 310*     Cardiac EnzymesNo results for input(s): TROPONINI in the last 168 hours. No results for input(s): TROPIPOC in the last 168 hours.  Chemistry Recent Labs  Lab 06/08/19 1846 06/09/19 0206  NA 139 139  K 4.2 3.9  CL 103 102  CO2 26 26  GLUCOSE 87 180*  BUN 28* 28*  CREATININE 1.43* 1.29*  CALCIUM 8.8* 8.6*  GFRNONAA 34* 38*  GFRAA 39* 44*  ANIONGAP 10 11    Recent Labs  Lab 06/08/19 1846  PROT 8.1  ALBUMIN 3.8  AST 28  ALT 30  ALKPHOS 95  BILITOT 0.6   Hematology Recent Labs  Lab 06/08/19 1846  WBC 3.0*  RBC 3.47*  HGB 10.5*  HCT 33.5*  MCV 96.5  MCH 30.3  MCHC 31.3  RDW 14.3  PLT 118*   BNP Recent Labs  Lab 06/08/19 1846 06/09/19 0206  BNP 557.6* 888.9*    DDimer No results for input(s): DDIMER in the last 168 hours.   Radiology/Studies:  Dg Chest 2 View  Result Date: 06/08/2019 CLINICAL DATA:  LEFT neck, shoulder and arm pain. Pacemaker placed 1 month ago. Wheezing. EXAM: CHEST - 2 VIEW COMPARISON:  Chest x-ray dated 11/02/2013. FINDINGS: Mild cardiomegaly. LEFT chest wall dual lead pacemaker in place. Central pulmonary vascular congestion. Prominent interstitial markings  bilaterally, presumably edema. No pleural effusion or pneumothorax seen. No acute appearing osseous abnormality. IMPRESSION: Cardiomegaly with central pulmonary vascular congestion and bilateral interstitial prominence, presumably edema, suggesting CHF/volume overload. Electronically Signed   By: Franki Cabot M.D.   On: 06/08/2019 18:30   Dg Cervical Spine Complete  Result Date: 06/08/2019 CLINICAL DATA:  Pain EXAM: CERVICAL SPINE - COMPLETE 4+ VIEW COMPARISON:  None. FINDINGS: There is no prevertebral soft tissue swelling. There appears to be grade 1 anterolisthesis of C5 on C6 and C6 on C7. These levels are difficult to evaluate secondary to overlapping soft tissues on the lateral view. There is mild bilateral osseous neural foraminal narrowing. IMPRESSION: 1. No acute displaced fracture. 2. Mild-to-moderate multilevel degenerative changes are noted of the cervical spine. 3. Possible  grade 1 anterolisthesis of C5 on C6 and C6 on C7. Electronically Signed   By: Constance Holster M.D.   On: 06/08/2019 17:40   Dg Chest Port 1 View  Result Date: 06/09/2019 CLINICAL DATA:  Congestive heart failure EXAM: PORTABLE CHEST 1 VIEW COMPARISON:  06/08/2019 FINDINGS: Unchanged position of left chest wall pacemaker leads. Improved aeration of both lungs. Unchanged mild cardiomegaly. No pleural effusion or pneumothorax. IMPRESSION: Improved aeration of both lungs. Electronically Signed   By: Ulyses Jarred M.D.   On: 06/09/2019 05:49   Dg Shoulder Left  Result Date: 06/08/2019 CLINICAL DATA:  Neck and shoulder pain. EXAM: LEFT SHOULDER - 2+ VIEW COMPARISON:  None. FINDINGS: There is no acute displaced fracture or dislocation. There are degenerative changes of the glenohumeral and acromioclavicular joints. There is some superior subluxation of the humeral head which is presumably chronic. IMPRESSION: No acute osseous abnormality. Electronically Signed   By: Constance Holster M.D.   On: 06/08/2019 17:38     Assessment and Plan:   1. Left arm pain - this is non-cardiac. I suspect musculoskeletal. However, despite her arm not being particularly swollen, she could have developed a blood clot. Would check a D dimer. Unfortunately ultrasound of the left upper extremity is not very accurate in diagnosing an upper extremity DVT after PPM insertion. Consider Tramadol for pain.  2. Sob - the episode of sob is unclear as to the etiology but she is paced and her BNP is up. I would suggest a 2D echo. Continue IV lasix. 3. HTN - she admits to being off of her meds. I would continue her current meds. I expect her bp will come down once she has been restarted and reached steady state.  4. PPM - her device was placed in [place]. It appears to be working normally. She does not need it interrogated but we will see her back for device interrogation as an outpatient.       For questions or updates, please contact [place] Please consult [email or web address] for contact info under     Signed, Cristopher Peru, MD  06/09/2019 8:41 AM

## 2019-06-09 NOTE — Progress Notes (Signed)
  Echocardiogram 2D Echocardiogram has been performed.  [name] 06/09/2019, 9:42 AM

## 2019-06-10 ENCOUNTER — Encounter (HOSPITAL_COMMUNITY): Payer: Self-pay | Admitting: Radiology

## 2019-06-10 ENCOUNTER — Inpatient Hospital Stay (HOSPITAL_COMMUNITY): Payer: Medicare Other

## 2019-06-10 DIAGNOSIS — I10 Essential (primary) hypertension: Secondary | ICD-10-CM

## 2019-06-10 DIAGNOSIS — J810 Acute pulmonary edema: Secondary | ICD-10-CM

## 2019-06-10 LAB — CBC
HCT: 27.9 % — ABNORMAL LOW (ref 36.0–46.0)
Hemoglobin: 8.7 g/dL — ABNORMAL LOW (ref 12.0–15.0)
MCH: 30 pg (ref 26.0–34.0)
MCHC: 31.2 g/dL (ref 30.0–36.0)
MCV: 96.2 fL (ref 80.0–100.0)
Platelets: 95 10*3/uL — ABNORMAL LOW (ref 150–400)
RBC: 2.9 MIL/uL — ABNORMAL LOW (ref 3.87–5.11)
RDW: 14.5 % (ref 11.5–15.5)
WBC: 2.4 10*3/uL — ABNORMAL LOW (ref 4.0–10.5)
nRBC: 0 % (ref 0.0–0.2)

## 2019-06-10 LAB — GLUCOSE, CAPILLARY
Glucose-Capillary: 75 mg/dL (ref 70–99)
Glucose-Capillary: 178 mg/dL — ABNORMAL HIGH (ref 70–99)
Glucose-Capillary: 169 mg/dL — ABNORMAL HIGH (ref 70–99)
Glucose-Capillary: 152 mg/dL — ABNORMAL HIGH (ref 70–99)

## 2019-06-10 LAB — BASIC METABOLIC PANEL
Anion gap: 10 (ref 5–15)
BUN: 36 mg/dL — ABNORMAL HIGH (ref 8–23)
CO2: 29 mmol/L (ref 22–32)
Calcium: 8.4 mg/dL — ABNORMAL LOW (ref 8.9–10.3)
Chloride: 102 mmol/L (ref 98–111)
Creatinine, Ser: 1.52 mg/dL — ABNORMAL HIGH (ref 0.44–1.00)
GFR calc Af Amer: 36 mL/min — ABNORMAL LOW (ref >60)
GFR calc non Af Amer: 31 mL/min — ABNORMAL LOW (ref >60)
Glucose, Bld: 123 mg/dL — ABNORMAL HIGH (ref 70–99)
Potassium: 3.7 mmol/L (ref 3.5–5.1)
Sodium: 141 mmol/L (ref 135–145)

## 2019-06-10 LAB — IRON AND TIBC
Iron: 37 ug/dL (ref 28–170)
Saturation Ratios: 14 % (ref 10.4–31.8)
TIBC: 271 ug/dL (ref 250–450)
UIBC: 234 ug/dL

## 2019-06-10 LAB — FERRITIN: Ferritin: 30 ng/mL (ref 11–307)

## 2019-06-10 MED ORDER — METHOCARBAMOL 500 MG PO TABS: 500.0000 mg | ORAL_TABLET | Freq: Three times a day (TID) | ORAL | Status: DC | PRN

## 2019-06-10 MED ORDER — TRAMADOL HCL 50 MG PO TABS
50.0000 mg | ORAL_TABLET | Freq: Four times a day (QID) | ORAL | Status: DC | PRN
  Administered 2019-06-10: 50 mg via ORAL
  Filled 2019-06-10: qty 1

## 2019-06-10 NOTE — TOC Initial Note (Signed)
Transition of Care [facility]) - Initial/Assessment Note    Patient Details  Name: [name] MRN: [id number] Date of Birth: Mar 03, 1933  Transition of [facility]) CM/SW Contact:    Wende Neighbors, LCSW Phone Number: 06/10/2019, 4:21 PM  Clinical Narrative:      CSW spoke to patient at bedside about discharge plans. Patient stated she is agreeable to dc home with [facility] following. Patient requested that CSW follow up with daughter so daughter can be aware of dc plan. CSW spoke with daughter [name] via phone and she stated she would like to have PT follow patient in the home. [name] stated she would like [facility] since patient has had them before in the past. CSW to reach out to [facility] to set up home health              Expected Discharge Plan: [place] Barriers to Discharge: Continued Medical Work up   Patient Goals and CMS Choice Patient states their goals for this hospitalization and ongoing recovery are:: to be home with family CMS Medicare.gov Compare Post Acute Care list provided to:: Other (Comment Required)(spoke with daughter and she would [facility]) Choice offered to / list presented to : Adult Children  Expected Discharge Plan and Services Expected Discharge Plan: [place] In-house Referral: Clinical Social Work   Post Acute Care Choice: [place] arrangements for the past 2 months: Single Family Home                           HH Arranged: PT          Prior Living Arrangements/Services Living arrangements for the past 2 months: Single Family Home Lives with:: Self, Adult Children Patient language and need for interpreter reviewed:: Yes        Need for Family Participation in Patient Care: Yes (Comment) Care giver support system in place?: Yes (comment)   Criminal Activity/Legal Involvement Pertinent to Current Situation/Hospitalization: No - Comment as needed  Activities of Daily Living Home  Assistive Devices/Equipment: None, Eyeglasses, Walker (specify type) ADL Screening (condition at time of admission) Patient's cognitive ability adequate to safely complete daily activities?: Yes Is the patient deaf or have difficulty hearing?: No Does the patient have difficulty seeing, even when wearing glasses/contacts?: No Does the patient have difficulty concentrating, remembering, or making decisions?: No Patient able to express need for assistance with ADLs?: No Does the patient have difficulty dressing or bathing?: No Independently performs ADLs?: No Communication: Independent Dressing (OT): Needs assistance Is this a change from baseline?: Pre-admission baseline Grooming: Independent Feeding: Independent Bathing: Needs assistance Is this a change from baseline?: Pre-admission baseline Toileting: Independent with device (comment) In/Out Bed: Independent with device (comment) Walks in Home: Independent with device (comment) Does the patient have difficulty walking or climbing stairs?: Yes Weakness of Legs: Both Weakness of Arms/Hands: None  Permission Sought/Granted      Share Information with NAME: [place] granted to share info w Relationship: daughter  Permission granted to share info w Contact Information: [phone]  Emotional Assessment Appearance:: Appears stated age Attitude/Demeanor/Rapport: Engaged Affect (typically observed): Accepting, Pleasant Orientation: : Oriented to Self, Oriented to  Time, Oriented to Place, Oriented to Situation Alcohol / Substance Use: Not Applicable Psych Involvement: No (comment)  Admission diagnosis:  CHF (congestive heart failure) (HCC) [I50.9] Acute pulmonary edema (HCC) [J81.0] Cervical radiculopathy [M54.12] Patient Active Problem  List   Diagnosis Date Noted  . Acute CHF (congestive heart failure) ([place]) 06/09/2019  . Acute pulmonary edema (HCC)   . Pressure injury of skin 01/04/2019  . Acute  pancreatitis 01/02/2019  . Malignant neoplasm of lower-inner quadrant of right breast of female, estrogen receptor positive ([place]) 11/23/2018  . Hematoma of right iliopsoas muscle 11/06/2018  . Intramuscular hematoma 11/06/2018  . Hypertensive heart disease with heart failure ([place]) 09/05/2017  . Aphasia 08/31/2017  . Dysphagia 08/31/2017  . CKD (chronic kidney disease), stage III ([place])   . LBBB (left bundle branch block)   . Anemia   . HTN (hypertension)   . Obesity   . DVT (deep venous thrombosis) ([place])   . DM (diabetes mellitus) ([place]) 07/28/2015  . Fall 07/28/2015  . Warfarin-induced coagulopathy ([place]) 07/28/2015  . Acute on chronic diastolic CHF (congestive heart failure) ([place]) 03/19/2014  . Edema 08/06/2013  . SVT (supraventricular tachycardia) ([place]) 10/15/2012  . First degree AV block 10/15/2012  . Acute renal insufficiency 10/14/2012  . Dizziness and giddiness   . Thrombocytopenia ([place]) 11/21/2011   PCP:  [name], MD Pharmacy:   [facility] [phone] - [place], [place] - [address] AT [place] [address] [place] [address] Phone: [phone] Fax: [phone]     Social Determinants of Health ([place]) Interventions    Readmission Risk Interventions Readmission Risk Prevention Plan 01/03/2019 01/02/2019  Transportation Screening (No Data) Complete  PCP or Specialist Appt within 3-5 Days - Complete  HRI or [place] - Complete  Social Work Consult for [place] Planning/Counseling - Complete  Palliative Care Screening - Not Applicable  Medication Review Press photographer) - Complete  Some recent data might be hidden

## 2019-06-10 NOTE — Progress Notes (Addendum)
Progress Note  Patient Name: [name] Date of Encounter: 06/10/2019  Primary Cardiologist: Larae Grooms, MD   Subjective   No acute overnight events. Breathing has improved some. Patient states she is no longer wheezing but still has a productive cough. Continues to have some left shoulder pain with occasional numbness/tingling shooting down left arm. No chest pain. Also notes a mild headache this morning.  Inpatient Medications    Scheduled Meds: . amiodarone  200 mg Oral Daily  . anastrozole  1 mg Oral Daily  . apixaban  5 mg Oral BID  . atorvastatin  20 mg Oral QHS  . carvedilol  25 mg Oral BID  . insulin aspart  0-20 Units Subcutaneous TID WC  . insulin detemir  16 Units Subcutaneous Daily  . insulin detemir  8 Units Subcutaneous QHS  . isosorbide-hydrALAZINE  1 tablet Oral BID  . lisinopril  2.5 mg Oral Daily  . magnesium oxide  400 mg Oral Daily  . oxybutynin  5 mg Oral BID  . pantoprazole  40 mg Oral Daily  . pregabalin  75 mg Oral QHS   Continuous Infusions:  PRN Meds: acetaminophen, methocarbamol, ondansetron (ZOFRAN) IV, traMADol   Vital Signs    Vitals:   06/09/19 2125 06/10/19 0513 06/10/19 0558 06/10/19 0749  BP: (!) 155/74 109/61    Pulse: 71 (!) 59    Resp: 16 18    Temp: 98.6 F (37 C) 97.8 F (36.6 C)    TempSrc: Oral     SpO2: 100% 98%    Weight:   85.2 kg 83.3 kg  Height:        Intake/Output Summary (Last 24 hours) at 06/10/2019 0941 Last data filed at 06/10/2019 0750 Gross per 24 hour  Intake 220 ml  Output 1750 ml  Net -1530 ml   Last 3 Weights 06/10/2019 06/10/2019 06/09/2019  Weight (lbs) 183 lb 11.2 oz 187 lb 13.3 oz 187 lb 9.8 oz  Weight (kg) 83.326 kg 85.2 kg 85.1 kg      Telemetry    AV pacing. - Personally Reviewed  ECG    No new ECG tracing today. - Personally Reviewed  Physical Exam   GEN: Elderly African-American female resting comfortably in no acute distress.   Neck: Supple. No JVD Cardiac: RRR. No  significant murmurs, rubs, or gallops. Radial pulses 2+ and equal bilaterally. Respiratory: No increased work of breathing. Crackles noted in bilateral bases up to mid back. No wheezes or rhonchi. GI: Soft, non-distended, and non-tender. Bowel sounds present. MS: Mild bilateral lower extremity edema (right > left but patient states this is not new). No deformity. Left arm tender to palpation over bony landmarks. Skin: Warm and dry. Neuro:  No focal deficits.  Psych: Normal affect. Responds appropriately.  Labs    High Sensitivity Troponin:   Recent Labs  Lab 06/08/19 1846 06/08/19 2037 06/08/19 2350 06/09/19 0206 06/09/19 1405  TROPONINIHS 58* 111* 257* 310* 159*      Cardiac EnzymesNo results for input(s): TROPONINI in the last 168 hours. No results for input(s): TROPIPOC in the last 168 hours.   Chemistry Recent Labs  Lab 06/08/19 1846 06/09/19 0206 06/10/19 0530  NA 139 139 141  K 4.2 3.9 3.7  CL 103 102 102  CO2 26 26 29   GLUCOSE 87 180* 123*  BUN 28* 28* 36*  CREATININE 1.43* 1.29* 1.52*  CALCIUM 8.8* 8.6* 8.4*  PROT 8.1  --   --   ALBUMIN 3.8  --   --  AST 28  --   --   ALT 30  --   --   ALKPHOS 95  --   --   BILITOT 0.6  --   --   GFRNONAA 34* 38* 31*  GFRAA 39* 44* 36*  ANIONGAP 10 11 10      Hematology Recent Labs  Lab 06/08/19 1846 06/10/19 0530  WBC 3.0* 2.4*  RBC 3.47* 2.90*  HGB 10.5* 8.7*  HCT 33.5* 27.9*  MCV 96.5 96.2  MCH 30.3 30.0  MCHC 31.3 31.2  RDW 14.3 14.5  PLT 118* 95*    BNP Recent Labs  Lab 06/08/19 1846 06/09/19 0206  BNP 557.6* 888.9*     DDimer  Recent Labs  Lab 06/09/19 1011  DDIMER 1.04*     Radiology    Dg Chest 2 View  Result Date: 06/08/2019 CLINICAL DATA:  LEFT neck, shoulder and arm pain. Pacemaker placed 1 month ago. Wheezing. EXAM: CHEST - 2 VIEW COMPARISON:  Chest x-ray dated 11/02/2013. FINDINGS: Mild cardiomegaly. LEFT chest wall dual lead pacemaker in place. Central pulmonary vascular  congestion. Prominent interstitial markings bilaterally, presumably edema. No pleural effusion or pneumothorax seen. No acute appearing osseous abnormality. IMPRESSION: Cardiomegaly with central pulmonary vascular congestion and bilateral interstitial prominence, presumably edema, suggesting CHF/volume overload. Electronically Signed   By: Franki Cabot M.D.   On: 06/08/2019 18:30   Dg Cervical Spine Complete  Result Date: 06/08/2019 CLINICAL DATA:  Pain EXAM: CERVICAL SPINE - COMPLETE 4+ VIEW COMPARISON:  None. FINDINGS: There is no prevertebral soft tissue swelling. There appears to be grade 1 anterolisthesis of C5 on C6 and C6 on C7. These levels are difficult to evaluate secondary to overlapping soft tissues on the lateral view. There is mild bilateral osseous neural foraminal narrowing. IMPRESSION: 1. No acute displaced fracture. 2. Mild-to-moderate multilevel degenerative changes are noted of the cervical spine. 3. Possible grade 1 anterolisthesis of C5 on C6 and C6 on C7. Electronically Signed   By: Constance Holster M.D.   On: 06/08/2019 17:40   Dg Chest Port 1 View  Result Date: 06/09/2019 CLINICAL DATA:  Congestive heart failure EXAM: PORTABLE CHEST 1 VIEW COMPARISON:  06/08/2019 FINDINGS: Unchanged position of left chest wall pacemaker leads. Improved aeration of both lungs. Unchanged mild cardiomegaly. No pleural effusion or pneumothorax. IMPRESSION: Improved aeration of both lungs. Electronically Signed   By: Ulyses Jarred M.D.   On: 06/09/2019 05:49   Dg Shoulder Left  Result Date: 06/08/2019 CLINICAL DATA:  Neck and shoulder pain. EXAM: LEFT SHOULDER - 2+ VIEW COMPARISON:  None. FINDINGS: There is no acute displaced fracture or dislocation. There are degenerative changes of the glenohumeral and acromioclavicular joints. There is some superior subluxation of the humeral head which is presumably chronic. IMPRESSION: No acute osseous abnormality. Electronically Signed   By: Constance Holster M.D.   On: 06/08/2019 17:38    Cardiac Studies   Echocardiogram 06/09/2019: Impressions: 1. Stage 1: 1: Apex is abnormal.  2. The left ventricle has low normal systolic function, with an ejection fraction of 50-55%. The cavity size was normal. There is moderate concentric left ventricular hypertrophy. Left ventricular diastolic Doppler parameters are consistent with  impaired relaxation. Indeterminate filling pressures.  3. Septal dysynergy consistent with LBBB.  4. The right ventricle has mildly reduced systolic function. The cavity was normal. There is mildly increased right ventricular wall thickness.  5. Left atrial size was severely dilated.  6. Right atrial size was moderately dilated.  7. Small  pericardial effusion.  8. The pericardial effusion is circumferential.  9. Mildly thickened tricuspid valve leaflets. 10. The aortic valve is tricuspid. Moderate thickening of the aortic valve. Mild aortic annular calcification noted. 11. The mitral valve is degenerative. Mild thickening of the mitral valve leaflet. There is mild mitral annular calcification present. 12. The tricuspid valve is grossly normal. Tricuspid valve regurgitation is moderate. 13. The aorta is normal unless otherwise noted. 14. There appears to be a circular mass attached to the pacemaker lead in the right atrium. Cannot exclude thrombus vs vegetation. Consider TEE for further evaluation.  Patient Profile   Ms Gagen is a 85 y.o. female with a history of SVT s/p ablation with recurrence and now on Amiodarone due to bradycardia with rate control medications, chronic diastolic CHF, mitral regurgitation, CVA, hypertension, hyperlipidemia, diabetes mellitus, DVT on Coumadin, CKD stage IV who is being seen for evaluation of shortness of breath and arm pain at the request of Dr. Karleen Hampshire.   Assessment & Plan    Acute on Chronic Diastolic CHF  - Patient presented with shortness of breath after running out of home Bumex 4  days prior. - BNP elevated at 888.9.  - Chest x-ray showed cardiomegaly with central pulmonary vascular congestion and  bilateral interstitial prominence, presumably edema, suggesting  CHF/volume overload.  - Echo this admission showed LVEF of 50-55% with moderate LVH, grade 1 diastolic dysfunction, and indeterminate filling pressures. RV systolic function also mildly reduced. Septal dyssynergy consistent with LBBB was also noted. There was circular mass attached to the pacemaker lead in the right atrium. Cannot exclude thrombus vs vegetation. Will defer whether TEE is needed to MD. - Patient currently on IV Lasix 40mg  every 6 hours. Documented urinary output of 2.9 L in the past 24 hours and net negative 4.4 since admission. Weight down 4 lbs from yesterday and 6 lbs from admission. Creatinine up from 1.29 to 1.52 today. - Patient still has significant crackles on exam. Continue current dose of IV Lasix at this time. However, will need to watch renal function closely. - Continue home Lisinopril 2.5mg  daily and Bidil 20-37.5mg  twice daily. Home Coreg was increased to 25mg  twice daily yesterday. - Continue to monitor daily weights, strict I/O's, and renal function.   Elevated Troponin  - High-sensitivity troponin elevated at 58 >> 111 >> 257 >> 310 >> 159.  - EKG shows AV paced rhythm so unable to really assess for ischemia. - Patient denies any angina.  - Likely demand ischemia in the setting of acute CHF. - Do not anticipate any additional ischemic work-up at this time.   Left Arm Pain  - Dr. Lovena Le felt like arm pain was non-cardiac and likely musculoskeletal. Chest x-ray showed degenerative changes of the glenohumeral and acromioclavicular joints. Some superior subluxation of the humeral head noted which is presumably chronic.  - Age adjusted D-dimer negative so upper extremity DVT unlikely.  - Management per primary team.   History of SVT with Syncope s/p PPM  - Patient s/p ablation with  recurrence of SVT. She recently had PPM placed about 1 month ago while living in [place] after recurrent episodes of syncope. Per Dr. Lovena Le, device appears to be working normally and does not need it interrogated at this time. However, EP team will see her back for device interrogation as an outpatient.  - Telemetry shows AV paced rhythm.  - Continue PO Amiodarone 200mg  daily.  - Continue Coreg as above.  CKD Stage III - Serum creatinine 1.43  on admission. Recent baseline around 1.2 to 1.4.  - Creatinine 1.52 today. - Continue to monitor daily BMETs.  History of DVT - Patient reports history of bilateral lower extremity DVTs about 10 years ago. - Previously on Coumadin but now on Eliquis.   Otherwise, per primary team. - Diabetes mellitus - Anemia of chronic disease - Mild thrombocytopenia - History of breast cancer on Arimidex  For questions or updates, please contact [place] Please consult [email or web address] for contact info under    Signed, Darreld Mclean, PA-C  06/10/2019, 9:41 AM    The patient was seen, examined and discussed with Darreld Mclean, PA-C  and I agree with the above.   The patient has diuresed, now with weights below her baseline of 185 pounds, currently 183 pounds.  Diuretics have been held in the settings of acute on chronic kidney failure with creatinine elevated from 1.29-1.52.  Telemetry shows sinus rhythm with intermittent AV pacing heart rates in 60s. Echocardiogram showed mildly decreased LVEF around 45 to 50% with LV dyssynchrony, there is a mobile echodensity on pacing leads in her right atrium.  In the absence of fever or sign of sepsis I would not pursue further work-up at this point given the patient age and comorbidities.  I would consider TEE once any signs of infection.  Ena Dawley, MD 06/10/2019

## 2019-06-10 NOTE — Consult Note (Signed)
   [facility] CM Inpatient Consult   06/10/2019  [name] 09-08-1933 [id number]   Patient chart has been reviewed for [place] Management services due to high unplanned readmission risk score, 25%, and 2 hospital admissions within past 6 months. Ms. Dumire is currently engaged with several disciplines, [place] care manager, pharmacist, and social worker, at primary care physician office, [facility].   Will notify [facility] CM care manager of patient hospital admission and will follow for progression and disposition plans.  Of note, [facility] Care Management services does not replace or interfere with any services that are arranged by inpatient case management or social work.  Netta Cedars, MSN, [facility] Liaison Nurse Mobile Phone [phone]  Toll free office [phone]

## 2019-06-10 NOTE — Progress Notes (Signed)
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  Evaluation in the ED she was found to have pulmonary edema on chest x-ray and she was referred to admission for acute on chronic diastolic heart failure exacerbation. She was started on IV Lasix and cardiology was consulted.  Cardiology recommends to continue with IV Lasix, follow-up with a 2D echocardiogram and outpatient follow-up for interrogation of her PPM and suggest that the left arm pain is probably noncardiac in origin. Echocardiogram showed low normal systolic function with an EF of 50% moderate concentric left ventricular hypertrophy and left ventricular diastolic Doppler parameters consistent with impaired relaxation.  Severely dilated left atrial size and moderately dilated right atrial size.  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  To consider TEE for further evaluation will defer to cardiology.    Assessment & Plan:   Active Problems:   Acute on chronic diastolic CHF (congestive heart failure) (HCC)   CKD (chronic kidney disease), stage III (HCC)   HTN (hypertension)   DM (diabetes mellitus) ([place])   Acute pulmonary edema (HCC)   Acute CHF (congestive heart failure) (HCC)   Acute on chronic diastolic CHF:  Admitted for IV diuresis with IV lasix 40 mg BID, replete electrolytes as needed.  Echocardiogram ordered  And showed  low normal systolic function with an EF of 50% moderate concentric left ventricular hypertrophy and left ventricular diastolic Doppler  parameters consistent with impaired relaxation.  Severely dilated left atrial size and moderately dilated right atrial size.  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  To consider TEE for further evaluation will defer to cardiology. Cardiology on board.  Diuresed about 4.5 L since admission, continue with strict intake and output and daily weights. Patient reports noncompliance to her medications for 4 days prior to admission. Fluid restriction.  Continue with Coreg, lisinopril 2.5 mg, Bidil,  Lipitor and Eliquis.  Left shoulder pain:  Better than yesterday, but still has difficulty with pain sometimes. X RAYS rule out fracture and dislocation.  CT of the shoulder ordered.  Tramadol and robaxin added.    H/o DVT:  Pt on eliquis at this time.  SVT: S/p ablation, followed by bradycardia.  S/p PPM and as per Dr Lovena Le the device is working  and is on amiodarone and coreg.   Hyperlipidemia:  Continue with lipitor.    Hypertension:  Well controlled,   Continue with coreg,  lisinopril 2.5 mg daily , and bidil 1 tab BID.   Stage 4 CKD:  Creatinine is at base line.     Anemia of chronic disease:  Patient's baseline hemoglobin around 10 currently dropped to 8.7 but no obvious signs of bleeding.  Repeat CBC in the morning. No signs of rectal bleed or malena.    Mild chronic thrombocytopenia.  Monitor. No obvious signs of bleeding.    H/o Breast cancer on arimidex.  Outpatient follow up with oncology.   Diabetes Mellitus:  CBG (last 3)  Recent Labs    06/09/19 2129 06/10/19 4627  06/10/19 1206  GLUCAP 133* 75 178*   A1c if 7.9.  Resume levemir BID and SSI.  No changes in her meds.     DVT prophylaxis: eliquis.  Code Status : full code.  Family Communication: none at bedside.  Disposition Plan: pending clinical improvement.    Consultants:   Cardiology Dr Lovena Le   Procedures: Echocardiogram.   Antimicrobials: none.     Subjective: She reports her left shoulder pain and arm pain is better than yesterday.  Wants to know when she can go home.   Objective: Vitals:   06/09/19 2125 06/10/19 0513 06/10/19 0558 06/10/19 0749  BP: (!) 155/74 109/61    Pulse: 71 (!) 59    Resp: 16 18    Temp: 98.6 F (37 C) 97.8 F (36.6 C)    TempSrc: Oral     SpO2: 100% 98%    Weight:   85.2 kg 83.3 kg  Height:        Intake/Output Summary (Last 24 hours) at 06/10/2019 1248 Last data filed at 06/10/2019 0750 Gross per 24 hour  Intake 220 ml  Output 1750 ml  Net -1530 ml   Filed Weights   06/09/19 0535 06/10/19 0558 06/10/19 0749  Weight: 85.1 kg 85.2 kg 83.3 kg    Examination:  General exam: Not appear to be in distress Respiratory system: Basilar Rales, no wheezing or rhonchi Cardiovascular system: S1-S2 heard, leg edema present Gastrointestinal system: Abdomen is soft, nontender, nondistended, bowel sounds are good Central nervous system: Alert and oriented to place and person, grossly nonfocal Extremities: Left shoulder pain on movement, pedal edema present Skin: No rashes seen Psychiatry: Mood appropriate    Data Reviewed: I have personally reviewed following labs and imaging studies  CBC: Recent Labs  Lab 06/08/19 1846 06/10/19 0530  WBC 3.0* 2.4*  NEUTROABS 1.2*  --   HGB 10.5* 8.7*  HCT 33.5* 27.9*  MCV 96.5 96.2  PLT 118* 95*   Basic Metabolic Panel: Recent Labs  Lab 06/08/19 1846 06/09/19 0206 06/10/19 0530  NA 139 139 141  K 4.2 3.9 3.7  CL 103 102 102  CO2 26 26 29   GLUCOSE 87 180* 123*  BUN 28* 28* 36*  CREATININE 1.43* 1.29* 1.52*  CALCIUM 8.8* 8.6* 8.4*   GFR: Estimated Creatinine Clearance: 26.4 mL/min (A) (by C-G formula based on SCr of 1.52 mg/dL (H)). Liver Function Tests: Recent Labs  Lab 06/08/19 1846  AST 28  ALT 30  ALKPHOS 95  BILITOT 0.6  PROT 8.1  ALBUMIN 3.8   No results for input(s): LIPASE, AMYLASE in the last 168 hours. No results for  input(s): AMMONIA in the last 168 hours. Coagulation Profile: No results for input(s): INR, PROTIME in the last 168 hours. Cardiac Enzymes: No results for input(s): CKTOTAL, CKMB, CKMBINDEX, TROPONINI in the last 168 hours. BNP (last 3 results) Recent Labs    07/06/18 1118 08/06/18 1244  PROBNP 1,500* 1,604*   HbA1C: Recent Labs    06/08/19 2037  HGBA1C 7.9*   CBG: Recent Labs  Lab 06/09/19 1202 06/09/19 1639 06/09/19 2129 06/10/19 0718 06/10/19 1206  GLUCAP 255* 85 133* 75 178*   Lipid Profile: No results for input(s): CHOL, HDL, LDLCALC, TRIG, CHOLHDL, LDLDIRECT in the last 72 hours. Thyroid Function Tests: No results for input(s): TSH, T4TOTAL, FREET4, T3FREE, THYROIDAB in the last 72 hours. Anemia Panel: No results for input(s): VITAMINB12, FOLATE, FERRITIN, TIBC, IRON, RETICCTPCT in the last 72 hours. Sepsis Labs: No results for  input(s): PROCALCITON, LATICACIDVEN in the last 168 hours.  Recent Results (from the past 240 hour(s))  SARS Coronavirus 2 [facility] order, Performed in [facility] hospital lab) Nasopharyngeal Nasopharyngeal Swab     Status: None   Collection Time: 06/08/19  8:04 PM   Specimen: Nasopharyngeal Swab  Result Value Ref Range Status   SARS Coronavirus 2 NEGATIVE NEGATIVE Final    Comment: (NOTE) If result is NEGATIVE SARS-CoV-2 target nucleic acids are NOT DETECTED. The SARS-CoV-2 RNA is generally detectable in upper and lower  respiratory specimens during the acute phase of infection. The lowest  concentration of SARS-CoV-2 viral copies this assay can detect is 250  copies / mL. A negative result does not preclude SARS-CoV-2 infection  and should not be used as the sole basis for treatment or other  patient management decisions.  A negative result may occur with  improper specimen collection / handling, submission of specimen other  than nasopharyngeal swab, presence of viral mutation(s) within the  areas targeted by this assay, and  inadequate number of viral copies  (<250 copies / mL). A negative result must be combined with clinical  observations, patient history, and epidemiological information. If result is POSITIVE SARS-CoV-2 target nucleic acids are DETECTED. The SARS-CoV-2 RNA is generally detectable in upper and lower  respiratory specimens dur ing the acute phase of infection.  Positive  results are indicative of active infection with SARS-CoV-2.  Clinical  correlation with patient history and other diagnostic information is  necessary to determine patient infection status.  Positive results do  not rule out bacterial infection or co-infection with other viruses. If result is PRESUMPTIVE POSTIVE SARS-CoV-2 nucleic acids MAY BE PRESENT.   A presumptive positive result was obtained on the submitted specimen  and confirmed on repeat testing.  While 2019 novel coronavirus  (SARS-CoV-2) nucleic acids may be present in the submitted sample  additional confirmatory testing may be necessary for epidemiological  and / or clinical management purposes  to differentiate between  SARS-CoV-2 and other Sarbecovirus currently known to infect humans.  If clinically indicated additional testing with an alternate test  methodology [phone]) is advised. The SARS-CoV-2 RNA is generally  detectable in upper and lower respiratory sp ecimens during the acute  phase of infection. The expected result is Negative. Fact Sheet for Patients:  StrictlyIdeas.no Fact Sheet for Healthcare Providers: BankingDealers.co.za This test is not yet approved or cleared by the [place] FDA and has been authorized for detection and/or diagnosis of SARS-CoV-2 by FDA under an Emergency Use Authorization (EUA).  This EUA will remain in effect (meaning this test can be used) for the duration of the COVID-19 declaration under Section 564(b)(1) of the Act, 21 U.S.C. section 360bbb-3(b)(1), unless the  authorization is terminated or revoked sooner. Performed at [facility], [address], [place], [place] [address]          Radiology Studies: Dg Chest 2 View  Result Date: 06/08/2019 CLINICAL DATA:  LEFT neck, shoulder and arm pain. Pacemaker placed 1 month ago. Wheezing. EXAM: CHEST - 2 VIEW COMPARISON:  Chest x-ray dated 11/02/2013. FINDINGS: Mild cardiomegaly. LEFT chest wall dual lead pacemaker in place. Central pulmonary vascular congestion. Prominent interstitial markings bilaterally, presumably edema. No pleural effusion or pneumothorax seen. No acute appearing osseous abnormality. IMPRESSION: Cardiomegaly with central pulmonary vascular congestion and bilateral interstitial prominence, presumably edema, suggesting CHF/volume overload. Electronically Signed   By: Franki Cabot M.D.   On: 06/08/2019 18:30   Dg Cervical  Spine Complete  Result Date: 06/08/2019 CLINICAL DATA:  Pain EXAM: CERVICAL SPINE - COMPLETE 4+ VIEW COMPARISON:  None. FINDINGS: There is no prevertebral soft tissue swelling. There appears to be grade 1 anterolisthesis of C5 on C6 and C6 on C7. These levels are difficult to evaluate secondary to overlapping soft tissues on the lateral view. There is mild bilateral osseous neural foraminal narrowing. IMPRESSION: 1. No acute displaced fracture. 2. Mild-to-moderate multilevel degenerative changes are noted of the cervical spine. 3. Possible grade 1 anterolisthesis of C5 on C6 and C6 on C7. Electronically Signed   By: Constance Holster M.D.   On: 06/08/2019 17:40   Dg Chest Port 1 View  Result Date: 06/09/2019 CLINICAL DATA:  Congestive heart failure EXAM: PORTABLE CHEST 1 VIEW COMPARISON:  06/08/2019 FINDINGS: Unchanged position of left chest wall pacemaker leads. Improved aeration of both lungs. Unchanged mild cardiomegaly. No pleural effusion or pneumothorax. IMPRESSION: Improved aeration of both lungs. Electronically Signed   By: Ulyses Jarred M.D.    On: 06/09/2019 05:49   Dg Shoulder Left  Result Date: 06/08/2019 CLINICAL DATA:  Neck and shoulder pain. EXAM: LEFT SHOULDER - 2+ VIEW COMPARISON:  None. FINDINGS: There is no acute displaced fracture or dislocation. There are degenerative changes of the glenohumeral and acromioclavicular joints. There is some superior subluxation of the humeral head which is presumably chronic. IMPRESSION: No acute osseous abnormality. Electronically Signed   By: Constance Holster M.D.   On: 06/08/2019 17:38        Scheduled Meds: . amiodarone  200 mg Oral Daily  . anastrozole  1 mg Oral Daily  . apixaban  5 mg Oral BID  . atorvastatin  20 mg Oral QHS  . carvedilol  25 mg Oral BID  . insulin aspart  0-20 Units Subcutaneous TID WC  . insulin detemir  16 Units Subcutaneous Daily  . insulin detemir  8 Units Subcutaneous QHS  . isosorbide-hydrALAZINE  1 tablet Oral BID  . lisinopril  2.5 mg Oral Daily  . magnesium oxide  400 mg Oral Daily  . oxybutynin  5 mg Oral BID  . pantoprazole  40 mg Oral Daily  . pregabalin  75 mg Oral QHS   Continuous Infusions:   LOS: 1 day    Time spent: [place], MD [facility] Pager [phone]   If 7PM-7AM, please contact night-coverage [email or web address] Password [facility] 06/10/2019, 12:48 PM

## 2019-06-10 NOTE — Progress Notes (Signed)
OT Cancellation Note  Patient Details Name: CAILAH REACH MRN: [id number] DOB: 27-Sep-1933   Cancelled Treatment:    Reason Eval/Treat Not Completed: Other (comment)  Pt not in room when OT checked on her.  Will recheck on pt next day  Kari Baars, [place] [phone] Office- [phone], Thereasa Parkin 06/10/2019, 5:24 PM

## 2019-06-10 NOTE — Plan of Care (Signed)
  Problem: Education: Goal: Knowledge of General Education information will improve Description: Including pain rating scale, medication(s)/side effects and non-pharmacologic comfort measures Outcome: Progressing   Problem: Health Behavior/Discharge Planning: Goal: Ability to manage health-related needs will improve Outcome: Progressing   Problem: Clinical Measurements: Goal: Ability to maintain clinical measurements within normal limits will improve Outcome: Progressing Goal: Will remain free from infection Outcome: Progressing Goal: Diagnostic test results will improve Outcome: Progressing Goal: Respiratory complications will improve Outcome: Progressing Goal: Cardiovascular complication will be avoided Outcome: Progressing   Problem: Activity: Goal: Risk for activity intolerance will decrease Outcome: Progressing   Problem: Nutrition: Goal: Adequate nutrition will be maintained Outcome: Progressing   Problem: Coping: Goal: Level of anxiety will decrease Outcome: Progressing   Problem: Elimination: Goal: Will not experience complications related to urinary retention Outcome: Progressing   Problem: Pain Managment: Goal: General experience of comfort will improve Outcome: Progressing   Problem: Safety: Goal: Ability to remain free from injury will improve Outcome: Progressing   Problem: Skin Integrity: Goal: Risk for impaired skin integrity will decrease Outcome: Progressing   Problem: Education: Goal: Ability to demonstrate management of disease process will improve Outcome: Progressing Goal: Ability to verbalize understanding of medication therapies will improve Outcome: Progressing   Problem: Activity: Goal: Capacity to carry out activities will improve Outcome: Progressing   Problem: Cardiac: Goal: Ability to achieve and maintain adequate cardiopulmonary perfusion will improve Outcome: Progressing

## 2019-06-11 ENCOUNTER — Other Ambulatory Visit: Payer: Self-pay | Admitting: Student

## 2019-06-11 ENCOUNTER — Inpatient Hospital Stay (HOSPITAL_COMMUNITY): Payer: Medicare Other

## 2019-06-11 ENCOUNTER — Other Ambulatory Visit: Payer: Self-pay

## 2019-06-11 DIAGNOSIS — I5031 Acute diastolic (congestive) heart failure: Secondary | ICD-10-CM

## 2019-06-11 DIAGNOSIS — I5041 Acute combined systolic (congestive) and diastolic (congestive) heart failure: Secondary | ICD-10-CM

## 2019-06-11 DIAGNOSIS — N183 Chronic kidney disease, stage 3 unspecified: Secondary | ICD-10-CM

## 2019-06-11 LAB — GLUCOSE, CAPILLARY
Glucose-Capillary: 51 mg/dL — ABNORMAL LOW (ref 70–99)
Glucose-Capillary: 85 mg/dL (ref 70–99)
Glucose-Capillary: 95 mg/dL (ref 70–99)
Glucose-Capillary: 172 mg/dL — ABNORMAL HIGH (ref 70–99)
Glucose-Capillary: 183 mg/dL — ABNORMAL HIGH (ref 70–99)

## 2019-06-11 LAB — CBC
HCT: 28.9 % — ABNORMAL LOW (ref 36.0–46.0)
Hemoglobin: 8.9 g/dL — ABNORMAL LOW (ref 12.0–15.0)
MCH: 30 pg (ref 26.0–34.0)
MCHC: 30.8 g/dL (ref 30.0–36.0)
MCV: 97.3 fL (ref 80.0–100.0)
Platelets: 105 10*3/uL — ABNORMAL LOW (ref 150–400)
RBC: 2.97 MIL/uL — ABNORMAL LOW (ref 3.87–5.11)
RDW: 14.6 % (ref 11.5–15.5)
WBC: 2.4 10*3/uL — ABNORMAL LOW (ref 4.0–10.5)
nRBC: 0 % (ref 0.0–0.2)

## 2019-06-11 LAB — BASIC METABOLIC PANEL
Anion gap: 9 (ref 5–15)
BUN: 33 mg/dL — ABNORMAL HIGH (ref 8–23)
CO2: 27 mmol/L (ref 22–32)
Calcium: 8.4 mg/dL — ABNORMAL LOW (ref 8.9–10.3)
Chloride: 104 mmol/L (ref 98–111)
Creatinine, Ser: 1.42 mg/dL — ABNORMAL HIGH (ref 0.44–1.00)
GFR calc Af Amer: 39 mL/min — ABNORMAL LOW (ref >60)
GFR calc non Af Amer: 34 mL/min — ABNORMAL LOW (ref >60)
Glucose, Bld: 64 mg/dL — ABNORMAL LOW (ref 70–99)
Potassium: 3.7 mmol/L (ref 3.5–5.1)
Sodium: 140 mmol/L (ref 135–145)

## 2019-06-11 MED ORDER — BUMETANIDE 1 MG PO TABS: 1.0000 mg | ORAL_TABLET | Freq: Every day | ORAL | 0 refills | Status: DC

## 2019-06-11 MED ORDER — LISINOPRIL 2.5 MG PO TABS: 2.5000 mg | ORAL_TABLET | Freq: Every day | ORAL | 0 refills | Status: DC

## 2019-06-11 MED ORDER — CARVEDILOL 25 MG PO TABS: 25.0000 mg | ORAL_TABLET | Freq: Two times a day (BID) | ORAL | 0 refills | Status: DC

## 2019-06-11 NOTE — Patient Outreach (Signed)
[name] [facility]) Care Management  06/11/2019  [name] [birth year] [id number]   Medication Adherence call to [name] Telephone call to Patient regarding Medication Adherence unable to reach patient. [name] is showing past due on Atorvastatin 20 mg under [place].   [place] Management Direct Dial [phone]  Fax [phone] [name]

## 2019-06-11 NOTE — TOC Transition Note (Signed)
Transition of Care [facility]) - CM/SW Discharge Note   Patient Details  Name: LANEY LOUDERBACK MRN: [id number] Date of Birth: 01-15-1933  Transition of Care [facility]) CM/SW Contact:  Dessa Phi, RN Phone Number: 06/11/2019, 2:14 PM   Clinical Narrative:  Received call from dtr [name] asking for [place] can provide [facility] aide also. No further CM needs.     Final next level of care: [place] Barriers to Discharge: No Barriers Identified   Patient Goals and CMS Choice Patient states their goals for this hospitalization and ongoing recovery are:: to be home with family CMS Medicare.gov Compare Post Acute Care list provided to:: Patient Represenative (must comment) Choice offered to / list presented to : Adult Children  Discharge Placement                       Discharge Plan and Services In-house Referral: Clinical Social Work   Post Acute Care Choice: Home Health                    HH Arranged: Nurse's Aide [facility] Agency: [facility]     Representative spoke with at [place]: [place] ([place]) Interventions     Readmission Risk Interventions Readmission Risk Prevention Plan 01/03/2019 01/02/2019  Transportation Screening (No Data) Complete  PCP or Specialist Appt within 3-5 Days - Complete  HRI or [place] - Complete  Social Work Consult for [place] Planning/Counseling - Complete  Palliative Care Screening - Not Applicable  Medication Review Press photographer) - Complete  Some recent data might be hidden

## 2019-06-11 NOTE — TOC Transition Note (Signed)
Transition of Care [facility]) - CM/SW Discharge Note   Patient Details  Name: LANIAH GRIMM MRN: [id number] Date of Birth: [birth year]  Transition of Care [facility]) CM/SW Contact:  Dessa Phi, RN Phone Number: 06/11/2019, 1:40 PM   Clinical Narrative:  Spoke to dtr [name] about d/c plans-[place] was used in [employer] rep Cory-able to provide only HHPT/OT-patient attending/dtr aware. No further CM needs.     Final next level of care: [place] Barriers to Discharge: No Barriers Identified   Patient Goals and CMS Choice Patient states their goals for this hospitalization and ongoing recovery are:: to be home with family CMS Medicare.gov Compare Post Acute Care list provided to:: Patient Represenative (must comment) Choice offered to / list presented to : Adult Children  Discharge Placement                       Discharge Plan and Services In-house Referral: Clinical Social Work   Post Acute Care Choice: Home Health                    HH Arranged: PT, OT [place] Agency: [facility]     Representative spoke with at [place]: [place] ([place]) Interventions     Readmission Risk Interventions Readmission Risk Prevention Plan 01/03/2019 01/02/2019  Transportation Screening (No Data) Complete  PCP or Specialist Appt within 3-5 Days - Complete  HRI or [place] - Complete  Social Work Consult for [place] Planning/Counseling - Complete  Palliative Care Screening - Not Applicable  Medication Review Press photographer) - Complete  Some recent data might be hidden

## 2019-06-11 NOTE — Progress Notes (Signed)
Pt's manual BP 98/55, HR 60's on tele. Dr. Karleen Hampshire made aware since this is lower than what pt's BP has been during this hospitalization. MD returned call. Will recheck BP and HR in 2 hours prior to discharge.

## 2019-06-11 NOTE — Progress Notes (Signed)
Physical Therapy Treatment Patient Details Name: [name] MRN: [id number] DOB: 1933/09/30 Today's Date: 06/11/2019    History of Present Illness 85 yo female admitted with CHF, L shoulder pain. Hx of PPM, DVT, CKD, DM, breast cancer    PT Comments    Pt able to increase gait distance today and only requires min/guard to do so with RW.  Also note less shoulder pain during activity today.  Note she was able to perform all aspects of bed mobility with HOB flat and without rails today without assistance. Pt will continue to benefit from skilled acute PT in order to address remaining deficits.    Follow Up Recommendations  Home health PT;Supervision/Assistance - 24 hour     Equipment Recommendations  None recommended by PT    Recommendations for Other Services       Precautions / Restrictions Precautions Precautions: Fall Precaution Comments: severe R knee arthritis/deformity Restrictions Weight Bearing Restrictions: No    Mobility  Bed Mobility Overal bed mobility: Modified Independent             General bed mobility comments: Increased time, but able to do so without use of bed functions and HOB flat.  Transfers Overall transfer level: Needs assistance Equipment used: Rolling walker (2 wheeled) Transfers: Sit to/from Stand Sit to Stand: Min guard         General transfer comment: Light assist for forward weight shift, cues for hand placement.  Ambulation/Gait Ambulation/Gait assistance: Min guard Gait Distance (Feet): 100 Feet Assistive device: Rolling walker (2 wheeled) Gait Pattern/deviations: Step-through pattern;Decreased stride length Gait velocity: decreased   General Gait Details: Pt able to increase gait distance today and also reports less shoulder pain during session today.  Min cues for safe turning with RW and upright posture.   Stairs             Wheelchair Mobility    Modified Rankin (Stroke Patients Only)       Balance  Overall balance assessment: Needs assistance Sitting-balance support: Feet supported Sitting balance-Leahy Scale: Good     Standing balance support: Single extremity supported Standing balance-Leahy Scale: Fair Standing balance comment: Pt able to let go of RW with BUEs in order to allow PT to remove back gown.                            Cognition Arousal/Alertness: Awake/alert Behavior During Therapy: WFL for tasks assessed/performed Overall Cognitive Status: Within Functional Limits for tasks assessed                                        Exercises      General Comments        Pertinent Vitals/Pain Pain Assessment: 0-10 Faces Pain Scale: Hurts a little bit Pain Location: L shoulder with activity Pain Descriptors / Indicators: Sore Pain Intervention(s): Monitored during session    Home Living                      Prior Function            PT Goals (current goals can now be found in the care plan section) Acute Rehab PT Goals Patient Stated Goal: to get better PT Goal Formulation: With patient Time For Goal Achievement: 06/23/19 Potential to Achieve Goals: Good Progress towards PT goals: Progressing toward goals  Frequency    Min 3X/week      PT Plan Current plan remains appropriate    Co-evaluation              AM-PAC PT "6 Clicks" Mobility   Outcome Measure  Help needed turning from your back to your side while in a flat bed without using bedrails?: None Help needed moving from lying on your back to sitting on the side of a flat bed without using bedrails?: None Help needed moving to and from a bed to a chair (including a wheelchair)?: A Little Help needed standing up from a chair using your arms (e.g., wheelchair or bedside chair)?: A Little Help needed to walk in hospital room?: A Little Help needed climbing 3-5 steps with a railing? : A Lot 6 Click Score: 19    End of Session Equipment Utilized  During Treatment: Gait belt Activity Tolerance: Patient tolerated treatment well Patient left: in chair;with call bell/phone within reach Nurse Communication: Mobility status PT Visit Diagnosis: Muscle weakness (generalized) (M62.81);Unsteadiness on feet (R26.81);Pain Pain - Right/Left: Left Pain - part of body: Shoulder     Time: 3967-2897 PT Time Calculation (min) (ACUTE ONLY): 23 min  Charges:  $Gait Training: 8-22 mins $Therapeutic Activity: 8-22 mins                     Cameron Sprang, PT, MPT  06/11/19, 9:39 AM    [place] Lasseter, Betha Loa 06/11/2019, 9:38 AM

## 2019-06-11 NOTE — TOC Progression Note (Signed)
Transition of Care [facility]) - Progression Note    Patient Details  Name: [name] MRN: [id number] Date of Birth: 1932-12-31  Transition of Care [facility]) CM/SW Contact  Nilo Fallin, Juliann Pulse, RN Phone Number: 06/11/2019, 1:44 PM  Clinical Narrative:   Tiburcio Bash- will  Contact a custodial level agency([facility]) for an aide.    Expected Discharge Plan: [place] Barriers to Discharge: No Barriers Identified  Expected Discharge Plan and Services Expected Discharge Plan: [place] In-house Referral: Clinical Social Work   Post Acute Care Choice: [place] arrangements for the past 2 months: [place] Expected Discharge Date: 06/11/19                         HH Arranged: PT, OT HH Agency: [place]     Representative spoke with at [place]: [place] ([place]) Interventions    Readmission Risk Interventions Readmission Risk Prevention Plan 01/03/2019 01/02/2019  Transportation Screening (No Data) Complete  PCP or Specialist Appt within 3-5 Days - Complete  HRI or [place] - Complete  Social Work Consult for [place] Planning/Counseling - Complete  Palliative Care Screening - Not Applicable  Medication Review [employer]) - Complete  Some recent data might be hidden

## 2019-06-11 NOTE — Plan of Care (Signed)
  Problem: Education: Goal: Knowledge of General Education information will improve Description: Including pain rating scale, medication(s)/side effects and non-pharmacologic comfort measures Outcome: Completed/Met

## 2019-06-11 NOTE — Evaluation (Signed)
Occupational Therapy Evaluation Patient Details Name: [name] MRN: [id number] DOB: 07-23-1933 Today's Date: 06/11/2019    History of Present Illness 85 yo female admitted with CHF, L shoulder pain. Hx of PPM, DVT, CKD, DM, breast cancer   Clinical Impression   Pt admitted with CHF and L shoulder pain. Pt currently with functional limitations due to the deficits listed below (see OT Problem List).  Pt will benefit from skilled OT to increase their safety and independence with ADL and functional mobility for ADL to facilitate discharge to venue listed below.      Follow Up Recommendations  Home health OT;Supervision/Assistance - 24 hour    Equipment Recommendations  None recommended by OT    Recommendations for Other Services       Precautions / Restrictions Precautions Precautions: Fall Precaution Comments: severe R knee arthritis/deformity Restrictions Weight Bearing Restrictions: No      Mobility Bed Mobility Overal bed mobility: Modified Independent             General bed mobility comments: Increased time, but able to do so without use of bed functions and HOB flat.  Transfers Overall transfer level: Needs assistance Equipment used: Rolling walker (2 wheeled) Transfers: Sit to/from Stand Sit to Stand: Min guard         General transfer comment: Light assist for forward weight shift, cues for hand placement.    Balance Overall balance assessment: Needs assistance Sitting-balance support: Feet supported Sitting balance-Leahy Scale: Good     Standing balance support: Single extremity supported Standing balance-Leahy Scale: Fair Standing balance comment: Pt able to let go of RW with BUEs in order to allow PT to remove back gown.                           ADL either performed or assessed with clinical judgement   ADL Overall ADL's : Needs assistance/impaired Eating/Feeding: Set up;Sitting   Grooming: Set up;Sitting   Upper Body  Bathing: Set up;Sitting   Lower Body Bathing: Sit to/from stand;Cueing for safety;With adaptive equipment;Moderate assistance   Upper Body Dressing : Set up;Sitting   Lower Body Dressing: Minimal assistance;Sit to/from stand;Cueing for compensatory techniques;Cueing for safety   Toilet Transfer: Minimal assistance;Cueing for safety;RW   Toileting- Clothing Manipulation and Hygiene: Minimal assistance;Sit to/from stand;Cueing for safety;Cueing for compensatory techniques         General ADL Comments: pt states family will A as needed.                  Pertinent Vitals/Pain Pain Assessment: 0-10 Faces Pain Scale: Hurts a little bit Pain Location: L shoulder with activity Pain Descriptors / Indicators: Sore Pain Intervention(s): Monitored during session        Extremity/Trunk Assessment         Cervical / Trunk Assessment Cervical / Trunk Assessment: Kyphotic   Communication Communication Communication: No difficulties   Cognition Arousal/Alertness: Awake/alert Behavior During Therapy: WFL for tasks assessed/performed Overall Cognitive Status: Within Functional Limits for tasks assessed                                                Home Living Family/patient expects to be discharged to:: Private residence Living Arrangements: Children Available Help at Discharge: Family;Available PRN/intermittently Type of Home: Apartment Home Access: Level entry  Home Layout: One level               Home Equipment: [place] - 4 wheels;Wheelchair - [employer] - 2 wheels          Prior Functioning/Environment Level of Independence: Needs assistance  Gait / Transfers Assistance Needed: ambulatory with RW vs wheelchair ADL's / Homemaking Assistance Needed: assistance for bathing, dressing            OT Problem List: Decreased strength;Impaired balance (sitting and/or standing)      OT Treatment/Interventions: Patient/family  education;Therapeutic activities;DME and/or AE instruction    OT Goals(Current goals can be found in the care plan section) Acute Rehab OT Goals Patient Stated Goal: home and regain I OT Goal Formulation: With patient Time For Goal Achievement: 06/18/19  OT Frequency: Min 2X/week    AM-PAC OT "6 Clicks" Daily Activity     Outcome Measure Help from another person eating meals?: None Help from another person taking care of personal grooming?: None Help from another person toileting, which includes using toliet, bedpan, or urinal?: A Little Help from another person bathing (including washing, rinsing, drying)?: A Little Help from another person to put on and taking off regular upper body clothing?: None Help from another person to put on and taking off regular lower body clothing?: A Lot 6 Click Score: 20   End of Session Nurse Communication: Mobility status  Activity Tolerance: Patient tolerated treatment well Patient left: in chair  OT Visit Diagnosis: Unsteadiness on feet (R26.81);Other abnormalities of gait and mobility (R26.89);Muscle weakness (generalized) (M62.81)                Time: 1914-7829 OT Time Calculation (min): 20 min Charges:  OT General Charges $OT Visit: 1 Visit OT Evaluation $OT Eval Moderate Complexity: 1 Mod  Kari Baars, [place] [phone] Office- [phone]     Yadier Bramhall, Edwena Felty D 06/11/2019, 11:36 AM

## 2019-06-11 NOTE — Progress Notes (Signed)
Dr. Karleen Hampshire made aware of repeat BP check 126/62 HR 59. Okay to still discharge home today.

## 2019-06-11 NOTE — Progress Notes (Signed)
Inpatient Diabetes Program Recommendations  AACE/ADA: New Consensus Statement on Inpatient Glycemic Control (2015)  Target Ranges:  Prepandial:   less than 140 mg/dL      Peak postprandial:   less than 180 mg/dL (1-2 hours)      Critically ill patients:  140 - 180 mg/dL   Lab Results  Component Value Date   GLUCAP 95 06/11/2019   HGBA1C 7.9 (H) 06/08/2019    Review of Glycemic Control  Diabetes history: DM2 Outpatient Diabetes medications: Levemir 16 units in am and 8 units QHS, Humalog 4-8 units tidwc Current orders for Inpatient glycemic control: Levemir 16 units in am and 8 units QHS  HgbA1C - 7.9% - acceptable for age, CKD.  Inpatient Diabetes Program Recommendations:      Decrease pm dose of Levemir to 6 units QHS Decrease Novolog to 0-15 units tidwc.  Will follow closely.  Thank you. Lorenda Peck, RD, LDN, CDE Inpatient Diabetes Coordinator [phone]

## 2019-06-11 NOTE — Plan of Care (Signed)
  Problem: Education: Goal: Knowledge of General Education information will improve Description: Including pain rating scale, medication(s)/side effects and non-pharmacologic comfort measures Outcome: Completed/Met   Problem: Health Behavior/Discharge Planning: Goal: Ability to manage health-related needs will improve Outcome: Completed/Met   Problem: Clinical Measurements: Goal: Ability to maintain clinical measurements within normal limits will improve 06/11/2019 1340 by Annie Sable, RN Outcome: Completed/Met 06/11/2019 0810 by Annie Sable, RN Outcome: Progressing Goal: Will remain free from infection Outcome: Completed/Met Goal: Diagnostic test results will improve 06/11/2019 1340 by Annie Sable, RN Outcome: Completed/Met 06/11/2019 0810 by Annie Sable, RN Outcome: Progressing Goal: Respiratory complications will improve 06/11/2019 1340 by Annie Sable, RN Outcome: Completed/Met 06/11/2019 0810 by Annie Sable, RN Outcome: Progressing Goal: Cardiovascular complication will be avoided 06/11/2019 1340 by Annie Sable, RN Outcome: Completed/Met 06/11/2019 0810 by Annie Sable, RN Outcome: Progressing   Problem: Activity: Goal: Risk for activity intolerance will decrease 06/11/2019 1340 by Annie Sable, RN Outcome: Completed/Met 06/11/2019 0810 by Annie Sable, RN Outcome: Progressing   Problem: Nutrition: Goal: Adequate nutrition will be maintained 06/11/2019 1340 by Annie Sable, RN Outcome: Completed/Met 06/11/2019 0810 by Annie Sable, RN Outcome: Progressing   Problem: Coping: Goal: Level of anxiety will decrease Outcome: Completed/Met   Problem: Elimination: Goal: Will not experience complications related to bowel motility Outcome: Completed/Met Goal: Will not experience complications related to urinary retention Outcome: Completed/Met   Problem: Pain Managment: Goal: General experience of comfort will improve 06/11/2019 1340  by Annie Sable, RN Outcome: Completed/Met 06/11/2019 0810 by Annie Sable, RN Outcome: Progressing   Problem: Safety: Goal: Ability to remain free from injury will improve 06/11/2019 1340 by Annie Sable, RN Outcome: Completed/Met 06/11/2019 0810 by Annie Sable, RN Outcome: Progressing   Problem: Skin Integrity: Goal: Risk for impaired skin integrity will decrease Outcome: Completed/Met   Problem: Education: Goal: Ability to demonstrate management of disease process will improve Outcome: Completed/Met Goal: Ability to verbalize understanding of medication therapies will improve Outcome: Completed/Met Goal: Individualized Educational Video(s) Outcome: Completed/Met   Problem: Activity: Goal: Capacity to carry out activities will improve 06/11/2019 1340 by Annie Sable, RN Outcome: Completed/Met 06/11/2019 0810 by Annie Sable, RN Outcome: Progressing   Problem: Cardiac: Goal: Ability to achieve and maintain adequate cardiopulmonary perfusion will improve 06/11/2019 1340 by Annie Sable, RN Outcome: Completed/Met 06/11/2019 0810 by Annie Sable, RN Outcome: Progressing

## 2019-06-11 NOTE — Discharge Summary (Signed)
Physician Discharge Summary  [name] TIR:[id number] DOB: 01/14/1933 DOA: 06/08/2019  PCP: [name], MD  Admit date: 06/08/2019 Discharge date: 06/11/2019  Admitted From: Home.  Disposition:  Home.  Recommendations for Outpatient Follow-up:  1. Follow up with PCP in 1-2 weeks 2. Please obtain BMP/CBC in one week 3. Please follow up cardiology as recommended.  4. Please follow up with Dr Collene Mares for esophageal dysmotility 5. Please follow up with orthopedics Dr Lorin Mercy in the office in one week for intraarticular steroid injection.   Home Health: yes  Discharge Condition:stable.  CODE STATUS:full code.  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 presents to ED with some shortness of breath,  left shoulder pain and left arm pain.  Evaluation in the ED she was found to have pulmonary edema on chest x-ray and she was referred to admission for acute on chronic diastolic heart failure exacerbation. She was started on IV Lasix and cardiology was consulted.  Cardiology recommends to continue with IV Lasix, follow-up with a 2D echocardiogram and outpatient follow-up for interrogation of her PPM and suggest that the left arm pain is probably noncardiac in origin. Echocardiogram showed low normal systolic function with an EF of 50% moderate concentric left ventricular hypertrophy and left ventricular diastolic Doppler parameters consistent with impaired relaxation.  Severely dilated left atrial size and moderately dilated right atrial size.  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  To consider TEE for further evaluation will defer to cardiology. Cardiology suggests no  indication for TEE  In the  Absence of  fever and sepsis and absence of sings of infection, . Discussed with the patient's daughter.     Discharge Diagnoses:  Active Problems:   Acute on chronic diastolic CHF (congestive heart failure) (HCC)   CKD (chronic kidney disease), stage III (HCC)   HTN (hypertension)   DM (diabetes mellitus) ([place])   Acute pulmonary edema (HCC)   Acute CHF (congestive heart failure) (HCC)  Acute on chronic diastolic CHF:  Admitted for IV diuresis with IV lasix, diuresed appropriately. Lasix discontinued and she was discharged on bumex to be started in am.  replete electrolytes as needed.  Renal parameters are improving.  Echocardiogram reviewed  It shows The left ventricle has low normal systolic function, with an ejection fraction of 50-55%. The cavity size was normal. There is moderate concentric left ventricular hypertrophy. Left ventricular diastolic Doppler parameters are consistent with  impaired relaxation. Indeterminate filling pressures. Overnight telemetry does not show any ischemic changes.    SVT: S/p ablation, followed by bradycardia.  S/p PPM and as per Dr Lovena Le the device is working  and is on amiodarone for maintenance of her rhythm.  Hyperlipidemia:  Continue with lipitor.    Hypertension:  Well controlled on discharge.   Stage 4 CKD:  Creatinine is better than on admission.  Continue to monitor.     Anemia of chronic disease:  No signs of rectal bleed or malena.    Mild thrombocytopenia.  Monitor.    H/o Breast cancer on arimidex.   Diabetes Mellitus:  CBG (last 3)  Recent Labs (last 2 labs)       Recent Labs    06/08/19 2228  06/09/19 0836  GLUCAP 79 173*     A1c if 7.9.  Resume levemir BID on discharge.         Discharge Instructions  Discharge Instructions    Diet - low sodium heart healthy   Complete by: As directed    Increase activity slowly   Complete by: As directed       Allergies as of 06/11/2019      Reactions   Aspirin Itching   Penicillins Itching, Rash   DID THE REACTION INVOLVE: Swelling of the face/tongue/throat, SOB, or low BP? y Sudden or severe rash/hives, skin peeling, or the inside of the mouth or nose? n Did it require medical treatment? yes When did it last happen?more than 10 years If all above answers are "NO", may proceed with cephalosporin use.      Medication List    STOP taking these medications   doxycycline 100 MG capsule Commonly known as: VIBRAMYCIN     TAKE these medications   acetaminophen 500 MG tablet Commonly known as: TYLENOL Take 1,000 mg by mouth every 6 (six) hours as needed for mild pain, moderate pain or headache.   amiodarone 200 MG tablet Commonly known as: PACERONE Take 1 tablet (200 mg total) by mouth daily.   anastrozole 1 MG tablet Commonly known as: ARIMIDEX Take 1 tablet (1 mg total) by mouth daily.   apixaban 5 MG Tabs tablet Commonly known as: ELIQUIS Take 1 tablet (5 mg total) by mouth 2 (two) times daily.   atorvastatin 20 MG tablet Commonly known as: LIPITOR Take 1 tablet (20 mg total) by mouth at bedtime.   BD Pen Needle Nano U/F 32G X 4 MM Misc Generic drug: Insulin Pen Needle Use as directed   BiDil 20-37.5 MG tablet Generic drug: isosorbide-hydrALAZINE Take 1 tablet by mouth 2 (two) times daily.   bumetanide 1 MG tablet Commonly known as: BUMEX Take 1 tablet (1 mg total) by mouth daily. Start taking on: June 12, 2019 What changed:   when to take this  additional instructions   calcium carbonate 600 MG Tabs tablet Commonly known as: OS-CAL Take 600 mg by mouth daily with breakfast.   carvedilol 25 MG tablet Commonly known as: COREG Take 1 tablet (25 mg total) by mouth 2 (two) times daily. What changed:   medication strength  how much to take   diclofenac sodium 1 % Gel Commonly known as: Voltaren Apply 2 g topically 4 (four) times daily. What  changed:   when to take this  reasons to take this   ferrous sulfate 325 (65 FE) MG tablet Take 325 mg by mouth daily with breakfast.   glucose blood test strip Commonly known as: Accu-Chek Guide Use as instructed to check blood sugars 3 times per day prn dx: e11.22   HumaLOG KwikPen 200 UNIT/ML Sopn Generic drug: Insulin Lispro Inject 4-8 Units into the skin 3 (three) times daily. Per sliding scale   Levemir FlexTouch 100 UNIT/ML Pen Generic drug: Insulin Detemir Inject 10-16 Units into the skin daily. 16 units in the am and 10 units in the evening What changed:   how much to take  additional instructions   lisinopril 2.5 MG tablet Commonly known as: ZESTRIL Take 1 tablet (2.5 mg total) by mouth daily. Start taking on: June 12, 2019   magnesium oxide 400 MG tablet Commonly known as: MAG-OX Take 400 mg by mouth daily.   omeprazole 40 MG capsule Commonly known as: PRILOSEC TAKE ONE CAPSULE  BY MOUTH DAILY BEFORE A MEAL What changed:   how much to take  how to take this  when to take this  additional instructions   oxybutynin 5 MG tablet Commonly known as: DITROPAN Take 1 tablet (5 mg total) by mouth 2 (two) times daily.   potassium chloride SA 20 MEQ tablet Commonly known as: K-DUR Take 2 tablets (40 mEq total) by mouth daily.   pregabalin 75 MG capsule Commonly known as: LYRICA Take 1 capsule (75 mg total) by mouth at bedtime.   traMADol 50 MG tablet Commonly known as: ULTRAM Take 50 mg by mouth 2 (two) times daily as needed for pain.      Follow-up Information    [place] Office Follow up.   Specialty: Cardiology Why: Please come by our [facility] office on 06/24/2019 for a lab visit so we can recheck your kidney function. Appointment listed for 10:30am but you can come by any time that day from 7:30am - 4:00pm. Contact information: [address] [place]       Charlie Pitter, PA-C Follow up.   Specialties: Cardiology, Radiology Why: You have a hospital follow-up scheduled for 07/02/2019 at 9:00am with Melina Copa, one of Dr. Hassell Done PAs. Please arrive 15 minutes early for check-in. Contact information: [address] [place]  [address] [phone]        Marybelle Killings, MD Follow up in 1 week(s).   Specialty: Orthopedic Surgery Why: LEFT SHOULDER PAIN  Contact information: [place] [address] [phone]        Juanita Craver, MD. Schedule an appointment as soon as possible for a visit in 1 week(s).   Specialty: Gastroenterology Why: FOR ESOPHAGEAL DYSMOTILITY Contact information: [address] [place] [address] [phone]          Allergies  Allergen Reactions  . Aspirin Itching  . Penicillins Itching and Rash    DID THE REACTION INVOLVE: Swelling of the face/tongue/throat, SOB, or low BP? y Sudden or severe rash/hives, skin peeling, or the inside of the mouth or nose? n Did it require medical treatment? yes When did it last happen?more than 10 years If all above answers are "NO", may proceed with cephalosporin use.     Consultations:  cardiology   Procedures/Studies: Dg Chest 2 View  Result Date: 06/08/2019 CLINICAL DATA:  LEFT neck, shoulder and arm pain. Pacemaker placed 1 month ago. Wheezing. EXAM: CHEST - 2 VIEW COMPARISON:  Chest x-ray dated 11/02/2013. FINDINGS: Mild cardiomegaly. LEFT chest wall dual lead pacemaker in place. Central pulmonary vascular congestion. Prominent interstitial markings bilaterally, presumably edema. No pleural effusion or pneumothorax seen. No acute appearing osseous abnormality. IMPRESSION: Cardiomegaly with central pulmonary vascular congestion and bilateral interstitial prominence, presumably edema, suggesting CHF/volume overload. Electronically Signed   By: Franki Cabot M.D.   On: 06/08/2019 18:30   Dg  Cervical Spine Complete  Result Date: 06/08/2019 CLINICAL DATA:  Pain EXAM: CERVICAL SPINE - COMPLETE 4+ VIEW COMPARISON:  None. FINDINGS: There is no prevertebral soft tissue swelling. There appears to be grade 1 anterolisthesis of C5 on C6 and C6 on C7. These levels are difficult to evaluate secondary to overlapping soft tissues on the lateral view. There is mild bilateral osseous neural foraminal narrowing. IMPRESSION: 1. No acute displaced fracture. 2. Mild-to-moderate multilevel degenerative changes are noted of the cervical spine. 3. Possible grade 1 anterolisthesis of C5  on C6 and C6 on C7. Electronically Signed   By: Constance Holster M.D.   On: 06/08/2019 17:40   Ct Shoulder Left Wo Contrast  Result Date: 06/10/2019 CLINICAL DATA:  Left shoulder pain for 6 days.  No known injury. EXAM: CT OF THE UPPER LEFT EXTREMITY WITHOUT CONTRAST TECHNIQUE: Multidetector CT imaging of the upper left extremity was performed according to the standard protocol. COMPARISON:  None. FINDINGS: Bones/Joint/Cartilage There is no acute bony or joint abnormality. Mild glenohumeral osteoarthritis is present. The acromion is type 2. No lytic or sclerotic lesion is seen. Degenerative cystic change in the humeral head at the rotator cuff insertion is noted. Ligaments Suboptimally assessed by CT. Muscles and Tendons Musculature of the shoulder girdle is preserved without atrophy. Although visualization is limited on CT without intra-articular contrast, the anterior 1 cm of the supraspinatus is thinned and likely torn with retraction of 1-2 cm. No other evidence of rotator cuff tear is identified. The long head of biceps is not definitely seen in the bicipital groove worrisome for tear from the superior labrum. Soft tissues A moderate to moderately large volume of fluid is present in the subacromial/subdeltoid bursa. Imaged lung parenchyma is clear. IMPRESSION: Likely 1 cm from front to back tear of the anterior and far lateral  supraspinatus with 1-2 cm of retraction. No atrophy. CT arthrogram is a more sensitive test for evaluation of rotator cuff tear. Likely tear of the long head of biceps from the superior labrum. Moderate to moderately large volume of subacromial/subdeltoid fluid consistent with bursitis. Mild acromioclavicular osteoarthritis. Electronically Signed   By: Inge Rise M.D.   On: 06/10/2019 15:28   Dg Chest Port 1 View  Result Date: 06/09/2019 CLINICAL DATA:  Congestive heart failure EXAM: PORTABLE CHEST 1 VIEW COMPARISON:  06/08/2019 FINDINGS: Unchanged position of left chest wall pacemaker leads. Improved aeration of both lungs. Unchanged mild cardiomegaly. No pleural effusion or pneumothorax. IMPRESSION: Improved aeration of both lungs. Electronically Signed   By: Ulyses Jarred M.D.   On: 06/09/2019 05:49   Dg Shoulder Left  Result Date: 06/08/2019 CLINICAL DATA:  Neck and shoulder pain. EXAM: LEFT SHOULDER - 2+ VIEW COMPARISON:  None. FINDINGS: There is no acute displaced fracture or dislocation. There are degenerative changes of the glenohumeral and acromioclavicular joints. There is some superior subluxation of the humeral head which is presumably chronic. IMPRESSION: No acute osseous abnormality. Electronically Signed   By: Constance Holster M.D.   On: 06/08/2019 17:38   Dg Esophagus W Single Cm (sol Or Thin Ba)  Result Date: 06/11/2019 CLINICAL DATA:  Difficulty swallowing following stroke 2 years ago EXAM: ESOPHOGRAM/BARIUM SWALLOW TECHNIQUE: Single contrast examination was performed using  thin barium. FLUOROSCOPY TIME:  Fluoroscopy Time:  1 minutes 12 seconds Radiation Exposure Index (if provided by the fluoroscopic device): 9.90 mGy Number of Acquired Spot Images: 23 COMPARISON:  None. FINDINGS: Swallowing mechanism appeared grossly unremarkable. There is pronounced esophageal dysmotility with extensive non propulsive, tertiary contractions. Proximal and mid esophagus remained mildly dilated  throughout the duration of the exam. Tortuous course of the mid to distal esophagus, likely accentuated by mass effect from tortuous descending thoracic aorta. There is no fixed stricture. No discrete ulceration. No contrast outpouchings or extraluminal contrast. Small sliding-type hiatal hernia. Spontaneous gastroesophageal reflux to the level of the midesophagus. IMPRESSION: 1. Esophageal dysmotility. 2. Small sliding-type hiatal hernia. 3. GERD. Electronically Signed   By: Davina Poke M.D.   On: 06/11/2019 09:21  Echocardiogram. The left ventricle  has low normal systolic function, with an ejection fraction of 50-55%. The cavity size was normal. There is moderate concentric left ventricular hypertrophy. Left ventricular diastolic Doppler parameters are consistent with  impaired relaxation. Indeterminate filling pressures.   Subjective: No new complaints.   Discharge Exam:  Vitals:   06/10/19 2054 06/11/19 0439 06/11/19 0500 06/11/19 0932  BP: 139/64 119/64    Pulse: 66 66  60  Resp: 16 20    Temp: 98.9 F (37.2 C) 97.6 F (36.4 C)    TempSrc: Oral Oral    SpO2: 95% 96%  98%  Weight:   85.2 kg   Height:        General: Pt is alert, awake, not in acute distress Cardiovascular: RRR, S1/S2 +, no rubs, no gallops Respiratory: CTA bilaterally, no wheezing, no rhonchi Abdominal: Soft, NT, ND, bowel sounds + Extremities: no edema, no cyanosis    The results of significant diagnostics from this hospitalization (including imaging, microbiology, ancillary and laboratory) are listed below for reference.     Microbiology: Recent Results (from the past 240 hour(s))  SARS Coronavirus 2 [facility] order, Performed in [facility] hospital lab) Nasopharyngeal Nasopharyngeal Swab     Status: None   Collection Time: 06/08/19  8:04 PM   Specimen: Nasopharyngeal Swab  Result Value Ref Range Status   SARS Coronavirus 2 NEGATIVE NEGATIVE Final    Comment: (NOTE) If result is  NEGATIVE SARS-CoV-2 target nucleic acids are NOT DETECTED. The SARS-CoV-2 RNA is generally detectable in upper and lower  respiratory specimens during the acute phase of infection. The lowest  concentration of SARS-CoV-2 viral copies this assay can detect is 250  copies / mL. A negative result does not preclude SARS-CoV-2 infection  and should not be used as the sole basis for treatment or other  patient management decisions.  A negative result may occur with  improper specimen collection / handling, submission of specimen other  than nasopharyngeal swab, presence of viral mutation(s) within the  areas targeted by this assay, and inadequate number of viral copies  (<250 copies / mL). A negative result must be combined with clinical  observations, patient history, and epidemiological information. If result is POSITIVE SARS-CoV-2 target nucleic acids are DETECTED. The SARS-CoV-2 RNA is generally detectable in upper and lower  respiratory specimens dur ing the acute phase of infection.  Positive  results are indicative of active infection with SARS-CoV-2.  Clinical  correlation with patient history and other diagnostic information is  necessary to determine patient infection status.  Positive results do  not rule out bacterial infection or co-infection with other viruses. If result is PRESUMPTIVE POSTIVE SARS-CoV-2 nucleic acids MAY BE PRESENT.   A presumptive positive result was obtained on the submitted specimen  and confirmed on repeat testing.  While 2019 novel coronavirus  (SARS-CoV-2) nucleic acids may be present in the submitted sample  additional confirmatory testing may be necessary for epidemiological  and / or clinical management purposes  to differentiate between  SARS-CoV-2 and other Sarbecovirus currently known to infect humans.  If clinically indicated additional testing with an alternate test  methodology [phone]) is advised. The SARS-CoV-2 RNA is generally  detectable  in upper and lower respiratory sp ecimens during the acute  phase of infection. The expected result is Negative. Fact Sheet for Patients:  StrictlyIdeas.no Fact Sheet for Healthcare Providers: BankingDealers.co.za This test is not yet approved or cleared by the [place] FDA and has been authorized for detection and/or diagnosis of  SARS-CoV-2 by FDA under an Emergency Use Authorization (EUA).  This EUA will remain in effect (meaning this test can be used) for the duration of the COVID-19 declaration under Section 564(b)(1) of the Act, 21 U.S.C. section 360bbb-3(b)(1), unless the authorization is terminated or revoked sooner. Performed at [facility], [place] [address] [place], [place] [address]      Labs: BNP (last 3 results) Recent Labs    06/08/19 1846 06/09/19 0206  BNP 557.6* 540.0*   Basic Metabolic Panel: Recent Labs  Lab 06/08/19 1846 06/09/19 0206 06/10/19 0530 06/11/19 0444  NA 139 139 141 140  K 4.2 3.9 3.7 3.7  CL 103 102 102 104  CO2 26 26 29 27   GLUCOSE 87 180* 123* 64*  BUN 28* 28* 36* 33*  CREATININE 1.43* 1.29* 1.52* 1.42*  CALCIUM 8.8* 8.6* 8.4* 8.4*   Liver Function Tests: Recent Labs  Lab 06/08/19 1846  AST 28  ALT 30  ALKPHOS 95  BILITOT 0.6  PROT 8.1  ALBUMIN 3.8   No results for input(s): LIPASE, AMYLASE in the last 168 hours. No results for input(s): AMMONIA in the last 168 hours. CBC: Recent Labs  Lab 06/08/19 1846 06/10/19 0530 06/11/19 0444  WBC 3.0* 2.4* 2.4*  NEUTROABS 1.2*  --   --   HGB 10.5* 8.7* 8.9*  HCT 33.5* 27.9* 28.9*  MCV 96.5 96.2 97.3  PLT 118* 95* 105*   Cardiac Enzymes: No results for input(s): CKTOTAL, CKMB, CKMBINDEX, TROPONINI in the last 168 hours. BNP: Invalid input(s): POCBNP CBG: Recent Labs  Lab 06/10/19 2134 06/11/19 0433 06/11/19 0503 06/11/19 0743 06/11/19 1119  GLUCAP 152* 51* 85 95 172*   D-Dimer Recent Labs     06/09/19 1011  DDIMER 1.04*   Hgb A1c Recent Labs    06/08/19 2037  HGBA1C 7.9*   Lipid Profile No results for input(s): CHOL, HDL, LDLCALC, TRIG, CHOLHDL, LDLDIRECT in the last 72 hours. Thyroid function studies No results for input(s): TSH, T4TOTAL, T3FREE, THYROIDAB in the last 72 hours.  Invalid input(s): FREET3 Anemia work up Recent Labs    06/10/19 1340  FERRITIN 30  TIBC 271  IRON 37   Urinalysis    Component Value Date/Time   COLORURINE YELLOW 01/01/2019 1723   APPEARANCEUR HAZY (A) 01/01/2019 1723   LABSPEC 1.009 01/01/2019 1723   PHURINE 6.0 01/01/2019 1723   GLUCOSEU >=500 (A) 01/01/2019 1723   HGBUR SMALL (A) 01/01/2019 1723   BILIRUBINUR negative 05/21/2019 1448   KETONESUR 5 (A) 01/01/2019 1723   PROTEINUR Positive (A) 05/21/2019 1448   PROTEINUR NEGATIVE 01/01/2019 1723   UROBILINOGEN 0.2 05/21/2019 1448   UROBILINOGEN 0.2 10/12/2012 2037   NITRITE negative 05/21/2019 1448   NITRITE NEGATIVE 01/01/2019 1723   LEUKOCYTESUR Large (3+) (A) 05/21/2019 1448   LEUKOCYTESUR LARGE (A) 01/01/2019 1723   Sepsis Labs Invalid input(s): PROCALCITONIN,  WBC,  LACTICIDVEN Microbiology Recent Results (from the past 240 hour(s))  SARS Coronavirus 2 [facility] order, Performed in [facility] hospital lab) Nasopharyngeal Nasopharyngeal Swab     Status: None   Collection Time: 06/08/19  8:04 PM   Specimen: Nasopharyngeal Swab  Result Value Ref Range Status   SARS Coronavirus 2 NEGATIVE NEGATIVE Final    Comment: (NOTE) If result is NEGATIVE SARS-CoV-2 target nucleic acids are NOT DETECTED. The SARS-CoV-2 RNA is generally detectable in upper and lower  respiratory specimens during the acute phase of infection. The lowest  concentration of SARS-CoV-2 viral copies this assay can  detect is 250  copies / mL. A negative result does not preclude SARS-CoV-2 infection  and should not be used as the sole basis for treatment or other  patient management decisions.  A  negative result may occur with  improper specimen collection / handling, submission of specimen other  than nasopharyngeal swab, presence of viral mutation(s) within the  areas targeted by this assay, and inadequate number of viral copies  (<250 copies / mL). A negative result must be combined with clinical  observations, patient history, and epidemiological information. If result is POSITIVE SARS-CoV-2 target nucleic acids are DETECTED. The SARS-CoV-2 RNA is generally detectable in upper and lower  respiratory specimens dur ing the acute phase of infection.  Positive  results are indicative of active infection with SARS-CoV-2.  Clinical  correlation with patient history and other diagnostic information is  necessary to determine patient infection status.  Positive results do  not rule out bacterial infection or co-infection with other viruses. If result is PRESUMPTIVE POSTIVE SARS-CoV-2 nucleic acids MAY BE PRESENT.   A presumptive positive result was obtained on the submitted specimen  and confirmed on repeat testing.  While 2019 novel coronavirus  (SARS-CoV-2) nucleic acids may be present in the submitted sample  additional confirmatory testing may be necessary for epidemiological  and / or clinical management purposes  to differentiate between  SARS-CoV-2 and other Sarbecovirus currently known to infect humans.  If clinically indicated additional testing with an alternate test  methodology [phone]) is advised. The SARS-CoV-2 RNA is generally  detectable in upper and lower respiratory sp ecimens during the acute  phase of infection. The expected result is Negative. Fact Sheet for Patients:  StrictlyIdeas.no Fact Sheet for Healthcare Providers: BankingDealers.co.za This test is not yet approved or cleared by the [place] FDA and has been authorized for detection and/or diagnosis of SARS-CoV-2 by FDA under an Emergency Use  Authorization (EUA).  This EUA will remain in effect (meaning this test can be used) for the duration of the COVID-19 declaration under Section 564(b)(1) of the Act, 21 U.S.C. section 360bbb-3(b)(1), unless the authorization is terminated or revoked sooner. Performed at [facility], [place] [address], [place], [place] [address]      Time coordinating discharge: 32 minutes  SIGNED:   Hosie Poisson, MD  [facility] 06/11/2019, 1:35 PM Pager   If 7PM-7AM, please contact night-coverage [email or web address] Password [email or web address]

## 2019-06-11 NOTE — Progress Notes (Addendum)
Progress Note  Patient Name: [name] Date of Encounter: 06/11/2019  Primary Cardiologist: Larae Grooms, MD   Subjective   No acute overnight events. Breathing continues to improved but patient states she still has a productive cough. Continues to have some left shoulder pain with occasional numbness/tingling shooting down left arm. She also had some spasms in her right shoulder this morning. No chest pain.   Inpatient Medications    Scheduled Meds: . amiodarone  200 mg Oral Daily  . anastrozole  1 mg Oral Daily  . apixaban  5 mg Oral BID  . atorvastatin  20 mg Oral QHS  . carvedilol  25 mg Oral BID  . insulin aspart  0-20 Units Subcutaneous TID WC  . insulin detemir  16 Units Subcutaneous Daily  . insulin detemir  8 Units Subcutaneous QHS  . isosorbide-hydrALAZINE  1 tablet Oral BID  . lisinopril  2.5 mg Oral Daily  . magnesium oxide  400 mg Oral Daily  . oxybutynin  5 mg Oral BID  . pantoprazole  40 mg Oral Daily  . pregabalin  75 mg Oral QHS   Continuous Infusions:  PRN Meds: acetaminophen, methocarbamol, ondansetron (ZOFRAN) IV, traMADol   Vital Signs    Vitals:   06/10/19 2054 06/11/19 0439 06/11/19 0500 06/11/19 0932  BP: 139/64 119/64    Pulse: 66 66  60  Resp: 16 20    Temp: 98.9 F (37.2 C) 97.6 F (36.4 C)    TempSrc: Oral Oral    SpO2: 95% 96%  98%  Weight:   85.2 kg   Height:        Intake/Output Summary (Last 24 hours) at 06/11/2019 1122 Last data filed at 06/11/2019 1000 Gross per 24 hour  Intake 420 ml  Output 1200 ml  Net -780 ml   Last 3 Weights 06/11/2019 06/10/2019 06/10/2019  Weight (lbs) 187 lb 13.3 oz 183 lb 11.2 oz 187 lb 13.3 oz  Weight (kg) 85.2 kg 83.326 kg 85.2 kg      Telemetry    AV pacing with rates in the 60's to 70's. - Personally Reviewed  ECG    No new ECG tracing today. - Personally Reviewed  Physical Exam   GEN: Elderly African-American female resting comfortably in no acute distress.   Neck: Supple.  No JVD Cardiac: RRR. No significant murmurs, rubs, or gallops. Radial pulses 2+ and equal bilaterally. Respiratory: No increased work of breathing. Crackles noted in bilateral bases. No wheezes or rhonchi. GI: Soft, non-distended, and non-tender. Bowel sounds present. MS: Mild bilateral lower extremity edema (right > left but patient states this is not new). No deformity. Left arm tender to palpation over bony landmarks. Skin: Warm and dry. Neuro:  No focal deficits.  Psych: Normal affect. Responds appropriately.  Labs    High Sensitivity Troponin:   Recent Labs  Lab 06/08/19 1846 06/08/19 2037 06/08/19 2350 06/09/19 0206 06/09/19 1405  TROPONINIHS 58* 111* 257* 310* 159*      Cardiac EnzymesNo results for input(s): TROPONINI in the last 168 hours. No results for input(s): TROPIPOC in the last 168 hours.   Chemistry Recent Labs  Lab 06/08/19 1846 06/09/19 0206 06/10/19 0530 06/11/19 0444  NA 139 139 141 140  K 4.2 3.9 3.7 3.7  CL 103 102 102 104  CO2 26 26 29 27   GLUCOSE 87 180* 123* 64*  BUN 28* 28* 36* 33*  CREATININE 1.43* 1.29* 1.52* 1.42*  CALCIUM 8.8* 8.6* 8.4* 8.4*  PROT 8.1  --   --   --   ALBUMIN 3.8  --   --   --   AST 28  --   --   --   ALT 30  --   --   --   ALKPHOS 95  --   --   --   BILITOT 0.6  --   --   --   GFRNONAA 34* 38* 31* 34*  GFRAA 39* 44* 36* 39*  ANIONGAP 10 11 10 9      Hematology Recent Labs  Lab 06/08/19 1846 06/10/19 0530 06/11/19 0444  WBC 3.0* 2.4* 2.4*  RBC 3.47* 2.90* 2.97*  HGB 10.5* 8.7* 8.9*  HCT 33.5* 27.9* 28.9*  MCV 96.5 96.2 97.3  MCH 30.3 30.0 30.0  MCHC 31.3 31.2 30.8  RDW 14.3 14.5 14.6  PLT 118* 95* 105*    BNP Recent Labs  Lab 06/08/19 1846 06/09/19 0206  BNP 557.6* 888.9*     DDimer  Recent Labs  Lab 06/09/19 1011  DDIMER 1.04*     Radiology    Ct Shoulder Left Wo Contrast  Result Date: 06/10/2019 CLINICAL DATA:  Left shoulder pain for 6 days.  No known injury. EXAM: CT OF THE UPPER  LEFT EXTREMITY WITHOUT CONTRAST TECHNIQUE: Multidetector CT imaging of the upper left extremity was performed according to the standard protocol. COMPARISON:  None. FINDINGS: Bones/Joint/Cartilage There is no acute bony or joint abnormality. Mild glenohumeral osteoarthritis is present. The acromion is type 2. No lytic or sclerotic lesion is seen. Degenerative cystic change in the humeral head at the rotator cuff insertion is noted. Ligaments Suboptimally assessed by CT. Muscles and Tendons Musculature of the shoulder girdle is preserved without atrophy. Although visualization is limited on CT without intra-articular contrast, the anterior 1 cm of the supraspinatus is thinned and likely torn with retraction of 1-2 cm. No other evidence of rotator cuff tear is identified. The long head of biceps is not definitely seen in the bicipital groove worrisome for tear from the superior labrum. Soft tissues A moderate to moderately large volume of fluid is present in the subacromial/subdeltoid bursa. Imaged lung parenchyma is clear. IMPRESSION: Likely 1 cm from front to back tear of the anterior and far lateral supraspinatus with 1-2 cm of retraction. No atrophy. CT arthrogram is a more sensitive test for evaluation of rotator cuff tear. Likely tear of the long head of biceps from the superior labrum. Moderate to moderately large volume of subacromial/subdeltoid fluid consistent with bursitis. Mild acromioclavicular osteoarthritis. Electronically Signed   By: Inge Rise M.D.   On: 06/10/2019 15:28   Dg Esophagus W Single Cm (sol Or Thin Ba)  Result Date: 06/11/2019 CLINICAL DATA:  Difficulty swallowing following stroke 2 years ago EXAM: ESOPHOGRAM/BARIUM SWALLOW TECHNIQUE: Single contrast examination was performed using  thin barium. FLUOROSCOPY TIME:  Fluoroscopy Time:  1 minutes 12 seconds Radiation Exposure Index (if provided by the fluoroscopic device): 9.90 mGy Number of Acquired Spot Images: 23 COMPARISON:   None. FINDINGS: Swallowing mechanism appeared grossly unremarkable. There is pronounced esophageal dysmotility with extensive non propulsive, tertiary contractions. Proximal and mid esophagus remained mildly dilated throughout the duration of the exam. Tortuous course of the mid to distal esophagus, likely accentuated by mass effect from tortuous descending thoracic aorta. There is no fixed stricture. No discrete ulceration. No contrast outpouchings or extraluminal contrast. Small sliding-type hiatal hernia. Spontaneous gastroesophageal reflux to the level of the midesophagus. IMPRESSION: 1. Esophageal dysmotility. 2. Small sliding-type  hiatal hernia. 3. GERD. Electronically Signed   By: Davina Poke M.D.   On: 06/11/2019 09:21    Cardiac Studies   Echocardiogram 06/09/2019: Impressions: 1. Stage 1: 1: Apex is abnormal.  2. The left ventricle has low normal systolic function, with an ejection fraction of 50-55%. The cavity size was normal. There is moderate concentric left ventricular hypertrophy. Left ventricular diastolic Doppler parameters are consistent with  impaired relaxation. Indeterminate filling pressures.  3. Septal dysynergy consistent with LBBB.  4. The right ventricle has mildly reduced systolic function. The cavity was normal. There is mildly increased right ventricular wall thickness.  5. Left atrial size was severely dilated.  6. Right atrial size was moderately dilated.  7. Small pericardial effusion.  8. The pericardial effusion is circumferential.  9. Mildly thickened tricuspid valve leaflets. 10. The aortic valve is tricuspid. Moderate thickening of the aortic valve. Mild aortic annular calcification noted. 11. The mitral valve is degenerative. Mild thickening of the mitral valve leaflet. There is mild mitral annular calcification present. 12. The tricuspid valve is grossly normal. Tricuspid valve regurgitation is moderate. 13. The aorta is normal unless otherwise  noted. 14. There appears to be a circular mass attached to the pacemaker lead in the right atrium. Cannot exclude thrombus vs vegetation. Consider TEE for further evaluation.  Patient Profile   Ms Guzzetta is a 85 y.o. female with a history of SVT s/p ablation with recurrence and now on Amiodarone due to bradycardia with rate control medications, chronic diastolic CHF, mitral regurgitation, CVA, hypertension, hyperlipidemia, diabetes mellitus, DVT on Coumadin, CKD stage IV who is being seen for evaluation of shortness of breath and arm pain at the request of Dr. Karleen Hampshire.   Assessment & Plan    Acute on Chronic Diastolic CHF  - Patient presented with shortness of breath after running out of home Bumex 4 days prior. - BNP elevated at 888.9.  - Chest x-ray showed cardiomegaly with central pulmonary vascular congestion and  bilateral interstitial prominence, presumably edema, suggesting CHF/volume overload.  - Echo this admission showed LVEF of 50-55% with moderate LVH, grade 1 diastolic dysfunction, and indeterminate filling pressures. RV systolic function also mildly reduced. Septal dyssynergy consistent with LBBB was also noted. There was circular mass attached to the pacemaker lead in the right atrium. Cannot exclude thrombus vs vegetation. Dr. Meda Coffee does not recommend TEE unless patient develops fever or signs of sepsis/infection. - IV Lasix stopped yesterday. Documented urinary output of 1.8 L in the past 24 hours and net negative 4.96 since admission. Weight up 4 lbs since yesterday but unsure if this is accurate. Renal function stable. - Patient still has crackles on exam but this may atelectasis. Will discuss whether additional IV Lasix is needed or whether patient can be restarted on home Bumex with MD. - Continue home Lisinopril 2.5mg  daily and Bidil 20-37.5mg  twice daily. Home Coreg was increased to 25mg  twice daily yesterday. - Continue to monitor daily weights, strict I/O's, and renal  function.   Elevated Troponin  - High-sensitivity troponin elevated at 58 >> 111 >> 257 >> 310 >> 159.  - EKG shows AV paced rhythm so unable to really assess for ischemia. - Patient denies any angina.  - Likely demand ischemia in the setting of acute CHF. - Do not anticipate any additional ischemic work-up at this time.   Left Arm Pain  - Dr. Lovena Le felt like arm pain was non-cardiac and likely musculoskeletal. Chest x-ray showed degenerative changes of the glenohumeral  and acromioclavicular joints. Some superior subluxation of the humeral head noted which is presumably chronic.  - Age adjusted D-dimer negative so upper extremity DVT unlikely.  - Management per primary team.   History of SVT with Syncope s/p PPM  - Patient s/p ablation with recurrence of SVT. She recently had PPM placed about 1 month ago while living in [place] after recurrent episodes of syncope. Per Dr. Lovena Le, device appears to be working normally and does not need it interrogated at this time. However, EP team will see her back for device interrogation as an outpatient.  - Telemetry shows AV paced rhythm.  - Continue PO Amiodarone 200mg  daily.  - Continue Coreg as above.  CKD Stage III - Serum creatinine 1.43 on admission. Recent baseline around 1.2 to 1.4.  - Creatinine 1.42 today. - Continue to monitor daily BMETs.  History of DVT - Patient reports history of bilateral lower extremity DVTs about 10 years ago. - Previously on Coumadin but now on Eliquis.   Otherwise, per primary team. - Diabetes mellitus - Anemia of chronic disease - Mild thrombocytopenia - History of breast cancer on Arimidex  For questions or updates, please contact [place] Please consult [email or web address] for contact info under    Signed, Darreld Mclean, PA-C  06/11/2019, 11:22 AM    The patient was seen, examined and discussed with Darreld Mclean, PA-C  and I agree with the above.   The patient has diuresed, now with  weights below her baseline of 185 pounds, currently 183 pounds.  Diuretics have been held in the settings of acute on chronic kidney failure with creatinine elevated from 1.29-1.52-1.42 off diuretics.  Telemetry shows sinus rhythm with intermittent AV pacing heart rates in 60s. Echocardiogram showed mildly decreased LVEF around 45 to 50% with LV dyssynchrony, there is a mobile echodensity on pacing leads in her right atrium.  In the absence of fever or sign of sepsis I would not pursue further work-up at this point given the patient age and comorbidities.  I would consider TEE once any signs of infection.  She appears euvolemic, she can be discharged, I would hold off diuretics today and restart Bumex 1 mg starting tomorrow. We will arrange for an outpatient follow up in our clinic in 2 weeks with labs.   [facility] will sign off.   Medication Recommendations:  As above Other recommendations (labs, testing, etc):  No further testing Follow up as an outpatient:  We wil arrange in 2 weeks with labs: BMP  Ena Dawley, MD 06/11/2019

## 2019-06-11 NOTE — Progress Notes (Signed)
Hypoglycemic Event  CBG: 51  Treatment: 8 oz juice/soda  Symptoms: Vision changes and itchiness  Follow-up CBG: Time: 0503 CBG Result: 85  Possible Reasons for Event: Inadequate meal intake  Comments/MD notified: Pt c/o vision changes and itchiness which is consistent for her prior hypoglycemic events. CBG checked per request and hypoglycemic protocol initiated.    [place]

## 2019-06-11 NOTE — Progress Notes (Signed)
Pt discharged home today per MD. Pt's IV site D/C'd and WDL. Pt's VSS. Pt provided with home medication list, discharge instructions and prescriptions. Verbalized understanding. Pt currently awaiting arrival of daughter for transport home.

## 2019-06-12 ENCOUNTER — Ambulatory Visit (INDEPENDENT_AMBULATORY_CARE_PROVIDER_SITE_OTHER): Payer: Medicare Other

## 2019-06-12 ENCOUNTER — Ambulatory Visit: Payer: Self-pay

## 2019-06-12 ENCOUNTER — Other Ambulatory Visit: Payer: Self-pay | Admitting: Internal Medicine

## 2019-06-12 ENCOUNTER — Telehealth: Payer: Self-pay

## 2019-06-12 DIAGNOSIS — I5032 Chronic diastolic (congestive) heart failure: Secondary | ICD-10-CM | POA: Diagnosis not present

## 2019-06-12 DIAGNOSIS — I130 Hypertensive heart and chronic kidney disease with heart failure and stage 1 through stage 4 chronic kidney disease, or unspecified chronic kidney disease: Secondary | ICD-10-CM

## 2019-06-12 DIAGNOSIS — Z794 Long term (current) use of insulin: Secondary | ICD-10-CM

## 2019-06-12 DIAGNOSIS — E1122 Type 2 diabetes mellitus with diabetic chronic kidney disease: Secondary | ICD-10-CM

## 2019-06-12 DIAGNOSIS — N184 Chronic kidney disease, stage 4 (severe): Secondary | ICD-10-CM | POA: Diagnosis not present

## 2019-06-12 MED ORDER — CARVEDILOL 25 MG PO TABS: 25.0000 mg | ORAL_TABLET | Freq: Two times a day (BID) | ORAL | 1 refills | Status: DC

## 2019-06-12 NOTE — Chronic Care Management (AMB) (Signed)
  Chronic Care Management   Outreach Note  06/12/2019 Name: [name] MRN: [id number] DOB: Mar 31, 1933  Referred by: [name], MD Reason for referral : Chronic Care Management (CCM RNCM Telephone Outreach )   A second unsuccessful telephone outreach was attempted today. The patient was referred to the case management team for assistance with chronic care management and care coordination.   Follow Up Plan: Telephone follow up appointment with care management team member scheduled for: 07/03/19  [name], RN, BSN, CCM Care Management Coordinator [place] [facility]  Direct Phone: [phone]

## 2019-06-12 NOTE — Telephone Encounter (Signed)
Left message no answer. Need to schedule hospital fu. I cancelled physical

## 2019-06-13 ENCOUNTER — Encounter: Payer: Medicare Other | Admitting: Internal Medicine

## 2019-06-13 ENCOUNTER — Telehealth: Payer: Self-pay

## 2019-06-13 ENCOUNTER — Ambulatory Visit: Payer: Medicare Other

## 2019-06-13 DIAGNOSIS — I69351 Hemiplegia and hemiparesis following cerebral infarction affecting right dominant side: Secondary | ICD-10-CM | POA: Diagnosis not present

## 2019-06-13 DIAGNOSIS — I69354 Hemiplegia and hemiparesis following cerebral infarction affecting left non-dominant side: Secondary | ICD-10-CM | POA: Diagnosis not present

## 2019-06-13 DIAGNOSIS — I69391 Dysphagia following cerebral infarction: Secondary | ICD-10-CM | POA: Diagnosis not present

## 2019-06-13 DIAGNOSIS — R1310 Dysphagia, unspecified: Secondary | ICD-10-CM | POA: Diagnosis not present

## 2019-06-13 DIAGNOSIS — I130 Hypertensive heart and chronic kidney disease with heart failure and stage 1 through stage 4 chronic kidney disease, or unspecified chronic kidney disease: Secondary | ICD-10-CM | POA: Diagnosis not present

## 2019-06-14 ENCOUNTER — Telehealth: Payer: Self-pay

## 2019-06-14 ENCOUNTER — Other Ambulatory Visit: Payer: Self-pay

## 2019-06-14 ENCOUNTER — Ambulatory Visit: Payer: Self-pay

## 2019-06-14 DIAGNOSIS — Z794 Long term (current) use of insulin: Secondary | ICD-10-CM

## 2019-06-14 DIAGNOSIS — I69354 Hemiplegia and hemiparesis following cerebral infarction affecting left non-dominant side: Secondary | ICD-10-CM | POA: Diagnosis not present

## 2019-06-14 DIAGNOSIS — I130 Hypertensive heart and chronic kidney disease with heart failure and stage 1 through stage 4 chronic kidney disease, or unspecified chronic kidney disease: Secondary | ICD-10-CM | POA: Diagnosis not present

## 2019-06-14 DIAGNOSIS — R1310 Dysphagia, unspecified: Secondary | ICD-10-CM | POA: Diagnosis not present

## 2019-06-14 DIAGNOSIS — I5032 Chronic diastolic (congestive) heart failure: Secondary | ICD-10-CM

## 2019-06-14 DIAGNOSIS — E1122 Type 2 diabetes mellitus with diabetic chronic kidney disease: Secondary | ICD-10-CM

## 2019-06-14 DIAGNOSIS — I69351 Hemiplegia and hemiparesis following cerebral infarction affecting right dominant side: Secondary | ICD-10-CM | POA: Diagnosis not present

## 2019-06-14 DIAGNOSIS — I69391 Dysphagia following cerebral infarction: Secondary | ICD-10-CM | POA: Diagnosis not present

## 2019-06-14 MED ORDER — ATORVASTATIN CALCIUM 20 MG PO TABS: 20.0000 mg | ORAL_TABLET | Freq: Every day | ORAL | 2 refills | Status: DC

## 2019-06-14 NOTE — Chronic Care Management (AMB) (Signed)
Chronic Care Management   Social Work Note  06/14/2019 Name: [name] MRN: [id number] DOB: 08/05/1933  [name] is a 85 y.o. year old female who sees [name], MD for primary care. The CCM team was consulted for assistance with Level of Care Concerns.   I placed an unsuccessful follow up call to the patient and her daughter to assess progression of patient SW goals.  Outpatient Encounter Medications as of 06/14/2019  Medication Sig Note  . acetaminophen (TYLENOL) 500 MG tablet Take 1,000 mg by mouth every 6 (six) hours as needed for mild pain, moderate pain or headache.    [name] amiodarone (PACERONE) 200 MG tablet Take 1 tablet (200 mg total) by mouth daily.   [name] anastrozole (ARIMIDEX) 1 MG tablet Take 1 tablet (1 mg total) by mouth daily.   [name] apixaban (ELIQUIS) 5 MG TABS tablet Take 1 tablet (5 mg total) by mouth 2 (two) times daily.   [name] atorvastatin (LIPITOR) 20 MG tablet Take 1 tablet (20 mg total) by mouth at bedtime.   . BD PEN NEEDLE NANO U/F 32G X 4 MM MISC Use as directed   . bumetanide (BUMEX) 1 MG tablet Take 1 tablet (1 mg total) by mouth daily.   . calcium carbonate (OS-CAL) 600 MG TABS Take 600 mg by mouth daily with breakfast.    . carvedilol (COREG) 25 MG tablet Take 1 tablet (25 mg total) by mouth 2 (two) times daily.   . diclofenac sodium (VOLTAREN) 1 % GEL Apply 2 g topically 4 (four) times daily. (Patient taking differently: Apply 2 g topically 4 (four) times daily as needed (pain). )   . ferrous sulfate 325 (65 FE) MG tablet Take 325 mg by mouth daily with breakfast.   . glucose blood (ACCU-CHEK GUIDE) test strip Use as instructed to check blood sugars 3 times per day prn dx: e11.22   . HUMALOG KWIKPEN 200 UNIT/ML SOPN Inject 4-8 Units into the skin 3 (three) times daily. Per sliding scale 06/09/2019: Pt used 4 units today   . isosorbide-hydrALAZINE (BIDIL) 20-37.5 MG tablet Take 1 tablet by mouth 2 (two) times daily.    [name] LEVEMIR FLEXTOUCH 100 UNIT/ML Pen Inject  10-16 Units into the skin daily. 16 units in the am and 10 units in the evening (Patient taking differently: Inject 8-16 Units into the skin daily. 16 units in the am and 8 units in the evening)   . lisinopril (ZESTRIL) 2.5 MG tablet Take 1 tablet (2.5 mg total) by mouth daily.   . magnesium oxide (MAG-OX) 400 MG tablet Take 400 mg by mouth daily.   [name] omeprazole (PRILOSEC) 40 MG capsule TAKE ONE CAPSULE BY MOUTH DAILY BEFORE A MEAL (Patient taking differently: Take 40 mg by mouth daily before breakfast. )   . oxybutynin (DITROPAN) 5 MG tablet Take 1 tablet (5 mg total) by mouth 2 (two) times daily.   . potassium chloride SA (K-DUR) 20 MEQ tablet Take 2 tablets (40 mEq total) by mouth daily.   . pregabalin (LYRICA) 75 MG capsule Take 1 capsule (75 mg total) by mouth at bedtime.   . traMADol (ULTRAM) 50 MG tablet Take 50 mg by mouth 2 (two) times daily as needed for pain.    No facility-administered encounter medications on file as of 06/14/2019.      Follow Up Plan: SW will follow up with patient by phone over the next 10 days.  [name], BSW, CDP Social Worker, Certified Dementia Practitioner  [facility] / [facility] [phone]

## 2019-06-14 NOTE — Chronic Care Management (AMB) (Signed)
Chronic Care Management   Follow Up Note   06/12/2019 Name: [name] MRN: [id number] DOB: [birth year]  Referred by: Stephanie Chard, MD Reason for referral : No chief complaint on file.   [name] is a 85 y.o. year old female who is a primary care patient of Stephanie Chard, MD. The CCM team was consulted for assistance with chronic disease management and care coordination needs.    Review of patient status, including review of consultants reports, relevant laboratory and other test results, and collaboration with appropriate care team members and the patient's provider was performed as part of comprehensive patient evaluation and provision of chronic care management services.    SDOH (Social Determinants of Health) screening performed today: None. See Care Plan for related entries.   I spoke briefly with patient's daughter [name] today and was asked to call [name] on her cell phone (updated demographics). I completed a CCM RNCM Telephone Follow up with [name] to f/u on her recent IP admission.   Outpatient Encounter Medications as of 06/12/2019  Medication Sig Note   traMADol (ULTRAM) 50 MG tablet Take 50 mg by mouth 2 (two) times daily as needed for pain.    acetaminophen (TYLENOL) 500 MG tablet Take 1,000 mg by mouth every 6 (six) hours as needed for mild pain, moderate pain or headache.     amiodarone (PACERONE) 200 MG tablet Take 1 tablet (200 mg total) by mouth daily.    anastrozole (ARIMIDEX) 1 MG tablet Take 1 tablet (1 mg total) by mouth daily.    apixaban (ELIQUIS) 5 MG TABS tablet Take 1 tablet (5 mg total) by mouth 2 (two) times daily.    BD PEN NEEDLE NANO U/F 32G X 4 MM MISC Use as directed    bumetanide (BUMEX) 1 MG tablet Take 1 tablet (1 mg total) by mouth daily.    calcium carbonate (OS-CAL) 600 MG TABS Take 600 mg by mouth daily with breakfast.     diclofenac sodium (VOLTAREN) 1 % GEL Apply 2 g topically 4 (four) times daily. (Patient  taking differently: Apply 2 g topically 4 (four) times daily as needed (pain). )    ferrous sulfate 325 (65 FE) MG tablet Take 325 mg by mouth daily with breakfast.    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 Per sliding scale 06/09/2019: Pt used 4 units today    isosorbide-hydrALAZINE (BIDIL) 20-37.5 MG tablet Take 1 tablet by mouth 2 (two) times daily.     LEVEMIR FLEXTOUCH 100 UNIT/ML Pen Inject 10-16 Units into the skin daily. 16 units in the am and 10 units in the evening (Patient taking differently: Inject 8-16 Units into the skin daily. 16 units in the am and 8 units in the evening)    lisinopril (ZESTRIL) 2.5 MG tablet Take 1 tablet (2.5 mg total) by mouth daily.    magnesium oxide (MAG-OX) 400 MG tablet Take 400 mg by mouth daily.    omeprazole (PRILOSEC) 40 MG capsule TAKE ONE CAPSULE BY MOUTH DAILY BEFORE A MEAL (Patient taking differently: Take 40 mg by mouth daily before breakfast. )    oxybutynin (DITROPAN) 5 MG tablet Take 1 tablet (5 mg total) by mouth 2 (two) times daily.    potassium chloride SA (K-DUR) 20 MEQ tablet Take 2 tablets (  40 mEq total) by mouth daily.    pregabalin (LYRICA) 75 MG capsule Take 1 capsule (75 mg total) by mouth at bedtime.    [DISCONTINUED] atorvastatin (LIPITOR) 20 MG tablet Take 1 tablet (20 mg total) by mouth at bedtime.    [DISCONTINUED] carvedilol (COREG) 25 MG tablet Take 1 tablet (25 mg total) by mouth 2 (two) times daily.    No facility-administered encounter medications on file as of 06/12/2019.      Goals Addressed      Patient Stated    "I check my sugars three times a day" (pt-stated)   On track    Current Barriers:   Knowledge Deficits related to disease process and Self Health management for Diabetes  Nurse Case Manager Clinical Goal(s):   Over the next 30  days, patient will verbalize basic understanding of Diabetes disease process and self health management plan as evidenced by patient will verbalize how to meal plan and follow a low carb Diabetic diet, monitor CBG's, take insulin exactly as prescribed and verbalize knowing when to call the doctor. Goal Met  06/14/19 Over the next 30 days, patient will report having no hypo/hyperglycemic episodes; <80 and or >250  06/14/19 Over the next 90 days, patient will lower her A1C less than 7.8  CCM RN CM Interventions:  06/12/19 call completed with patient and daughter [name]  Evaluation of current treatment plan related to diabetes and patient's adherence to plan as established by provider.  Discussed plans with patient for ongoing care management follow up and provided patient with direct contact information for care management team  Provided patient with printed educational materials related to Diabetes Management Safety Tool  Advised patient, providing education and rationale, to check cbg before meals and record, calling the CCM team for findings outside established parameters.    Patient reports adherence to following her prescribed treatment plan for her diabetes; reports no recent lows, today FBS 132; continues to monitor CBG before meals and is logging; patient verbalizes understanding as to when to call the CCM team and or provider if needed for hypo/hyperglycemic episodes  Patient Self Care Activities with assistance from daughter  [name] understanding of the education/information provided today   Self administers medications as prescribed  Attends all scheduled provider appointments  Calls pharmacy for medication refills  Performs ADL's independently  Performs IADL's independently  Calls provider office for new concerns or questions  Please see past updates related to this goal by clicking on the "Past Updates" button in the selected goal        "I want a second  opinion about my cancer" (pt-stated)   On track    Current Barriers:  Knowledge Deficits related to treatment management for malignant neoplasm of lower-inner quadrant or right breast   Nurse Case Manager Clinical Goal(s):   Over the next 30 days, patient will verbalize understanding of plan for diagnosis and treatment for malignant neoplasm of lower right breast. Goal Met  Over the next 30 days, patient will attend all scheduled medical appointments: including follow up at [facility] for 2nd opinion of breast neoplasm scheduled for 03/04/19. Goal Met  06/14/19: Over the next 30 days, patient will have increased knowledge and understanding of the plan for surgical consultation of her right breast and lymph nodes secondary to malignancy.   CCM RN CM Interventions:  06/12/19 call completed with patient and daughter [name]   Evaluation of current treatment plan related to diagnosis and treatment for right breast neoplasm and  patient's adherence to plan as established by provider  Assessed for understanding of the prescribed treatment plan as recommended by Oncologist  Discussed plans with patient for ongoing care management follow up and provided patient with direct contact information for care management team  Per chart review, noted the following update; second opinion for R breast mass; completed at [facility]: April 02, 2019 per Ozella Rocks, MD  Right:     Spiculated mass with surrounding developing asymmetry and pleomorphic     calcifications in the lower central right breast at middle depth on     mammography, corresponding to large, heterogeneous, irregular, spiculated     mass on ultrasound, status post ultrasound guided biopsy with placement of     ribbon clip, consistent with biopsy-proven invasive ductal carcinoma  Left:     No mammographic evidence of malignancy.    BI-RADS Category:     Overall Assessment: 6 - Known Biopsy-Proven Malignancy     RECOMMENDATION:    Surgical Consultation is recommended for the right breast.    [place]      Patient Self Care Activities with assistance from daughter  Self administers medications as prescribed  Attends all scheduled provider appointments  Calls pharmacy for medication refills  Performs ADL's independently  Performs IADL's independently  Calls provider office for new concerns or questions  Please see past updates related to this goal by clicking on the "Past Updates" button in the selected goal        "My back, neck and left arm pain has worsened" (pt-stated)   Not on track    Current Barriers:   Knowledge Deficits related to diagnosis and treatment management for cervical radiculopathy  Nurse Case Manager Clinical Goal(s):   Over the next 30 days, patient will verbalize understanding of plan for long term treatment and or symptom management for cervical radiculopathy  Over the next 90 days, patient will not experience hospital admission. Hospital Admissions in last 6 months = 01/01/19; 06/08/19  CCM RN CM Interventions:  06/12/19 call completed with patient and daughter [name]   Evaluation of current treatment plan related to Cervical radiculopathy and associated pain and patient's adherence to plan as established by provider.  Reviewed medications with patient and discussed discussed patient's current pain regimen for back, neck and L shoulder pain; confirmed patient is taking Tramadol 50 mg tablets, 2 as needed; patient reports this medication "takes the edge off"; discussed patient was informed she would have a muscle relaxer ordered at discharge but this was not done; patient is missing several of her medications including Magnesium Oxide, Vitamin B12, Vitamin D3, Carvedilol 25 mg, Lisinopril 2.5 mg; discussed patient's concerns that Lisinopril was newly added during IP admission, patient would like [name] to [place] Lisinopril due to [name] d/c  this medication years ago but patient unsure why  Collaborated with [name] regarding missing medications and refills needed, [name] advised of patient's concerns related to Lisinopril being added to her regimen during IP admission; also advised patient is requesting a muscle relaxer be prescribed, she was expecting this to be done at discharge however it was not prescribed   Discussed plans with patient for ongoing care management follow up and provided patient with direct contact information for care management team  Reviewed scheduled/upcoming provider appointments including: post discharge follow up with [name]; patient's daughter requested a call from the office to arrange but will need to be late afternoon due she is working, sent message to  [facility] CMA staff  Patient Self Care Activities with assistance of her daughter   Self administers medications as prescribed  Attends all scheduled provider appointments  Calls pharmacy for medication refills  Performs ADL's independently  Performs IADL's independently  Calls provider office for new concerns or questions  Initial goal documentation         Telephone follow up appointment with care management team member scheduled for: 06/19/19   [name], RN, BSN, CCM Care Management Coordinator [place] [facility]  Direct Phone: [phone]

## 2019-06-14 NOTE — Patient Instructions (Signed)
Visit Information  Goals Addressed      Patient Stated   . "I check my sugars three times a day" (pt-stated)   On track    Current Barriers:  [name] Knowledge Deficits related to disease process and Self Health management for Diabetes  Nurse Case Manager Clinical Goal(s):  [name] Over the next 30 days, patient will verbalize basic understanding of Diabetes disease process and self health management plan as evidenced by patient will verbalize how to meal plan and follow a low carb Diabetic diet, monitor CBG's, take insulin exactly as prescribed and verbalize knowing when to call the doctor. Goal Met . 06/14/19 Over the next 30 days, patient will report having no hypo/hyperglycemic episodes; <80 and or >250 . 06/14/19 Over the next 90 days, patient will lower her A1C less than 7.8  CCM RN CM Interventions:  06/12/19 call completed with patient and daughter [name] . Evaluation of current treatment plan related to diabetes and patient's adherence to plan as established by provider. . Discussed plans with patient for ongoing care management follow up and provided patient with direct contact information for care management team . Provided patient with printed educational materials related to Diabetes Management Safety Tool . Advised patient, providing education and rationale, to check cbg before meals and record, calling the CCM team for findings outside established parameters.   . Patient reports adherence to following her prescribed treatment plan for her diabetes; reports no recent lows, today FBS 132; continues to monitor CBG before meals and is logging; patient verbalizes understanding as to when to call the CCM team and or provider if needed for hypo/hyperglycemic episodes  Patient Self Care Activities with assistance from daughter  Rosezena Sensor understanding of the education/information provided today  . Self administers medications as prescribed . Attends all scheduled provider  appointments . Calls pharmacy for medication refills . Performs ADL's independently . Performs IADL's independently . Calls provider office for new concerns or questions  Please see past updates related to this goal by clicking on the "Past Updates" button in the selected goal       . "I want a second opinion about my cancer" (pt-stated)   On track    Current Barriers:  Knowledge Deficits related to treatment management for malignant neoplasm of lower-inner quadrant or right breast   Nurse Case Manager Clinical Goal(s):  [name] Over the next 30 days, patient will verbalize understanding of plan for diagnosis and treatment for malignant neoplasm of lower right breast. Goal Met . Over the next 30 days, patient will attend all scheduled medical appointments: including follow up at [facility] for 2nd opinion of breast neoplasm scheduled for 03/04/19. Goal Met . 06/14/19: Over the next 30 days, patient will have increased knowledge and understanding of the plan for surgical consultation of her right breast and lymph nodes secondary to malignancy.   CCM RN CM Interventions:  06/12/19 call completed with patient and daughter [name]  . Evaluation of current treatment plan related to diagnosis and treatment for right breast neoplasm and patient's adherence to plan as established by provider  Assessed for understanding of the prescribed treatment plan as recommended by Oncologist  Discussed plans with patient for ongoing care management follow up and provided patient with direct contact information for care management team  Per chart review, noted the following update; second opinion for R breast mass; completed at [facility]: April 02, 2019 per Ozella Rocks, MD  Right:     Spiculated mass with surrounding developing  asymmetry and pleomorphic     calcifications in the lower central right breast at middle depth on     mammography, corresponding to large, heterogeneous, irregular,  spiculated     mass on ultrasound, status post ultrasound guided biopsy with placement of     ribbon clip, consistent with biopsy-proven invasive ductal carcinoma  Left:     No mammographic evidence of malignancy.    BI-RADS Category:     Overall Assessment: 6 - Known Biopsy-Proven Malignancy    RECOMMENDATION:    Surgical Consultation is recommended for the right breast.    [place]      Patient Self Care Activities with assistance from daughter . Self administers medications as prescribed . Attends all scheduled provider appointments . Calls pharmacy for medication refills . Performs ADL's independently . Performs IADL's independently . Calls provider office for new concerns or questions  Please see past updates related to this goal by clicking on the "Past Updates" button in the selected goal       . "My back, neck and left arm pain has worsened" (pt-stated)   Not on track    Current Barriers:  [name] Knowledge Deficits related to diagnosis and treatment management for cervical radiculopathy  Nurse Case Manager Clinical Goal(s):  [name] Over the next 30 days, patient will verbalize understanding of plan for long term treatment and or symptom management for cervical radiculopathy . Over the next 90 days, patient will not experience hospital admission. Hospital Admissions in last 6 months = 01/01/19; 06/08/19  CCM RN CM Interventions:  06/12/19 call completed with patient and daughter [name]  . Evaluation of current treatment plan related to Cervical radiculopathy and associated pain and patient's adherence to plan as established by provider. . Reviewed medications with patient and discussed discussed patient's current pain regimen for back, neck and L shoulder pain; confirmed patient is taking Tramadol 50 mg tablets, 2 as needed; patient reports this medication "takes the edge off"; discussed patient was informed she would have a muscle relaxer ordered at discharge but  this was not done; patient is missing several of her medications including Magnesium Oxide, Vitamin B12, Vitamin D3, Carvedilol 25 mg, Lisinopril 2.5 mg; discussed patient's concerns that Lisinopril was newly added during IP admission, patient would like [name] to [place] Lisinopril due to [name] d/c this medication years ago but patient unsure why . Collaborated with [name] regarding missing medications and refills needed, [name] advised of patient's concerns related to Lisinopril being added to her regimen during IP admission; also advised patient is requesting a muscle relaxer be prescribed, she was expecting this to be done at discharge however it was not prescribed  . Discussed plans with patient for ongoing care management follow up and provided patient with direct contact information for care management team . Reviewed scheduled/upcoming provider appointments including: post discharge follow up with [name]; patient's daughter requested a call from the office to arrange but will need to be late afternoon due she is working, sent message to [facility] staff  Patient Self Care Activities with assistance of her daughter  . Self administers medications as prescribed . Attends all scheduled provider appointments . Calls pharmacy for medication refills . Performs ADL's independently . Performs IADL's independently . Calls provider office for new concerns or questions  Initial goal documentation        The patient verbalized understanding of instructions provided today and declined a print copy of patient instruction materials.  Telephone follow up appointment with care management team member scheduled for: 06/19/19  [name], RN, BSN, CCM Care Management Coordinator [place] [facility]  Direct Phone: [phone]

## 2019-06-17 ENCOUNTER — Telehealth: Payer: Self-pay

## 2019-06-17 NOTE — Telephone Encounter (Signed)
2nd attempt to reschedule appt. Unable to leave message

## 2019-06-18 ENCOUNTER — Encounter: Payer: Self-pay | Admitting: Internal Medicine

## 2019-06-18 ENCOUNTER — Other Ambulatory Visit: Payer: Self-pay

## 2019-06-18 ENCOUNTER — Ambulatory Visit (INDEPENDENT_AMBULATORY_CARE_PROVIDER_SITE_OTHER): Payer: Medicare Other | Admitting: Internal Medicine

## 2019-06-18 VITALS — Ht 60.0 in

## 2019-06-18 DIAGNOSIS — R5381 Other malaise: Secondary | ICD-10-CM | POA: Diagnosis not present

## 2019-06-18 DIAGNOSIS — Z20828 Contact with and (suspected) exposure to other viral communicable diseases: Secondary | ICD-10-CM

## 2019-06-18 DIAGNOSIS — J069 Acute upper respiratory infection, unspecified: Secondary | ICD-10-CM | POA: Diagnosis not present

## 2019-06-18 MED ORDER — DOXYCYCLINE HYCLATE 100 MG PO CAPS: 100.0000 mg | ORAL_CAPSULE | Freq: Two times a day (BID) | ORAL | 0 refills | Status: DC

## 2019-06-19 ENCOUNTER — Other Ambulatory Visit: Payer: Self-pay

## 2019-06-19 ENCOUNTER — Telehealth: Payer: Self-pay

## 2019-06-19 ENCOUNTER — Ambulatory Visit: Payer: Self-pay

## 2019-06-19 DIAGNOSIS — I130 Hypertensive heart and chronic kidney disease with heart failure and stage 1 through stage 4 chronic kidney disease, or unspecified chronic kidney disease: Secondary | ICD-10-CM

## 2019-06-19 DIAGNOSIS — I69391 Dysphagia following cerebral infarction: Secondary | ICD-10-CM | POA: Diagnosis not present

## 2019-06-19 DIAGNOSIS — Z794 Long term (current) use of insulin: Secondary | ICD-10-CM | POA: Diagnosis not present

## 2019-06-19 DIAGNOSIS — R1310 Dysphagia, unspecified: Secondary | ICD-10-CM | POA: Diagnosis not present

## 2019-06-19 DIAGNOSIS — I5032 Chronic diastolic (congestive) heart failure: Secondary | ICD-10-CM

## 2019-06-19 DIAGNOSIS — I69354 Hemiplegia and hemiparesis following cerebral infarction affecting left non-dominant side: Secondary | ICD-10-CM | POA: Diagnosis not present

## 2019-06-19 DIAGNOSIS — E1122 Type 2 diabetes mellitus with diabetic chronic kidney disease: Secondary | ICD-10-CM

## 2019-06-19 DIAGNOSIS — I69351 Hemiplegia and hemiparesis following cerebral infarction affecting right dominant side: Secondary | ICD-10-CM | POA: Diagnosis not present

## 2019-06-19 DIAGNOSIS — N184 Chronic kidney disease, stage 4 (severe): Secondary | ICD-10-CM | POA: Diagnosis not present

## 2019-06-20 ENCOUNTER — Telehealth: Payer: Self-pay

## 2019-06-20 ENCOUNTER — Ambulatory Visit: Payer: Self-pay

## 2019-06-20 DIAGNOSIS — I130 Hypertensive heart and chronic kidney disease with heart failure and stage 1 through stage 4 chronic kidney disease, or unspecified chronic kidney disease: Secondary | ICD-10-CM | POA: Diagnosis not present

## 2019-06-20 DIAGNOSIS — E1122 Type 2 diabetes mellitus with diabetic chronic kidney disease: Secondary | ICD-10-CM

## 2019-06-20 DIAGNOSIS — I69391 Dysphagia following cerebral infarction: Secondary | ICD-10-CM | POA: Diagnosis not present

## 2019-06-20 DIAGNOSIS — I69354 Hemiplegia and hemiparesis following cerebral infarction affecting left non-dominant side: Secondary | ICD-10-CM | POA: Diagnosis not present

## 2019-06-20 DIAGNOSIS — Z794 Long term (current) use of insulin: Secondary | ICD-10-CM

## 2019-06-20 DIAGNOSIS — R5381 Other malaise: Secondary | ICD-10-CM

## 2019-06-20 DIAGNOSIS — I69351 Hemiplegia and hemiparesis following cerebral infarction affecting right dominant side: Secondary | ICD-10-CM | POA: Diagnosis not present

## 2019-06-20 DIAGNOSIS — R1310 Dysphagia, unspecified: Secondary | ICD-10-CM | POA: Diagnosis not present

## 2019-06-20 NOTE — Patient Instructions (Signed)
Visit Information  Goals Addressed      Patient Stated   . "I have a [name] bit of a cough" (pt-stated)       Current Barriers:  [name] Knowledge Deficits related to diagnosis and treatment management related to having developed a cough . Risk for COVID-19 . Lack of transportation for COVID testing   Nurse Case Manager Clinical Goal(s):  [name] Over the next 14 days, patient will report her "cough" has resolved without developing further s/s suggestive of COVID-19  CCM RN CM Interventions:  06/19/19 completed call with patient   . Discussed patient does not have transportation in order to receive a COVID-19 test, discussed [name] is aware . Discussed patient's cough has improved and she denies having other known symptoms suspicious of COVID-19 . Discussed the importance of quarantining in her home for at least 14 days and to wear a mask and keep 30ft away if she has to leave her home; discussed using good handwashing and hand sanitizer appropriately . Evaluation of current treatment plan related to new onset of developing a cough and patient's adherence to plan as established by provider. . Provided education to patient re: signs and symptoms suggestive of COVID-19; discussed when to call the provider, CCM team and or 911 for new or worsening symptoms including fever, increased weakness and shortness of breath . Discussed plans with patient for ongoing care management follow up and provided patient with direct contact information for care management team  Patient Self Care Activities:  . Self administers medications as prescribed . Attends all scheduled provider appointments . Calls pharmacy for medication refills . Performs ADL's independently . Performs IADL's independently . Calls provider office for new concerns or questions  Initial goal documentation     . "My back, neck and left arm pain has worsened" (pt-stated)   On track    Current Barriers:  [name] Knowledge Deficits related to  diagnosis and treatment management for cervical radiculopathy  Nurse Case Manager Clinical Goal(s):  [name] Over the next 30 days, patient will verbalize understanding of plan for long term treatment and or symptom management for cervical radiculopathy . Over the next 90 days, patient will not experience hospital admission. Hospital Admissions in last 6 months = 01/01/19; 06/08/19  CCM RN CM Interventions:  06/19/19 call completed with patient   . Evaluation of current treatment plan related to Cervical radiculopathy and associated pain and patient's adherence to plan as established by provider. . Reviewed medications with patient and discussed discussed patient completed a telephonic follow up visit with [name] on 06/18/19 at which time she reviewed her medications and was given the Okay to resume the Lisinopril prescribed by the hospitalist following her most recent IP admission/discharge; patient states she has all medications filled and in her home; she verbalizes having a good understanding about the indication, dosage and frequency of each of her prescribed medications . Discussed plans with patient for ongoing care management follow up and provided patient with direct contact information for care management team . Provided patient with printed educational materials related to Medication management Safety Tool . Reviewed scheduled/upcoming provider appointments including: post discharge follow up visits, including scheduled visits and recommendations for needed follow up visits . Discussed patient has started in home PT and is satisfied with the therapy she received with her initial PT visit . Encouraged patient to balance her activity with rest, stay well hydrated and take pain medication about 30 minutes before her PT visit to help  optimize her ability to participate  Patient Self Care Activities with assistance of her daughter  . Self administers medications as prescribed . Attends all  scheduled provider appointments . Calls pharmacy for medication refills . Performs ADL's independently . Performs IADL's independently . Calls provider office for new concerns or questions  Please see past updates related to this goal by clicking on the "Past Updates" button in the selected goal        Other   . Marland KitchenHigh Risk for Knowledge Deficit for CHF    On track    Current Barriers:  [name] Knowledge Deficits related to disease process and Self Health management for CHF   Nurse Case Manager Clinical Goal(s):  [name] Over the next 60 days, patient will verbalize basic understanding of CHF disease process and self health management plan as evidenced by patient and caregiver will verbalize better understanding of the disease process for CHF and will verbalize increased understanding of MD recommendations for CHF disease management. 04/04/19 re-established goal date to 60 days due to treatment delays secondary to Corona virus  CCM RN CM Interventions:  06/19/19 completed call with patient   . Evaluation of current treatment plan related to CHF and patient's adherence to plan as established by provider. . Provided education to patient re: the importance of completing daily weights each am after voiding and wearing the smallest amount of clothing; discussed recording weights daily; discussed when to call the doctor and or CCM team if needed . Discussed plans with patient for ongoing care management follow up and provided patient with direct contact information for care management team . Provided patient with printed educational materials related to CHF Zone Management Tool  Patient Self Care Activities:  . Attends all scheduled provider appointments . Calls pharmacy for medication refills . Calls provider office for new concerns or questions  Please see past updates related to this goal by clicking on the "Past Updates" button in the selected goal        The patient verbalized understanding of  instructions provided today and declined a print copy of patient instruction materials.   Telephone follow up appointment with care management team member scheduled for: 06/24/19  [name], RN, BSN, CCM Care Management Coordinator [facility]  Direct Phone: [phone]

## 2019-06-20 NOTE — Chronic Care Management (AMB) (Signed)
Chronic Care Management   Social Work Note  06/20/2019 Name: [name] MRN: [id number] DOB: 01-Apr-1933  CCM SW placed a second unsuccessful call to the patient to assess placement needs as well as provide transportation education after receiving feedback from the patients provider the patient was in need of transportation resources. Unfortunately, the patient did not answer and her voice mailbox was full which prohibited CCM SW from leaving a HIPAA compliant voice message requesting a return call.  Outpatient Encounter Medications as of 06/20/2019  Medication Sig Note  . acetaminophen (TYLENOL) 500 MG tablet Take 1,000 mg by mouth every 6 (six) hours as needed for mild pain, moderate pain or headache.    [name] amiodarone (PACERONE) 200 MG tablet Take 1 tablet (200 mg total) by mouth daily.   [name] anastrozole (ARIMIDEX) 1 MG tablet Take 1 tablet (1 mg total) by mouth daily.   [name] apixaban (ELIQUIS) 5 MG TABS tablet Take 1 tablet (5 mg total) by mouth 2 (two) times daily.   [name] atorvastatin (LIPITOR) 20 MG tablet Take 1 tablet (20 mg total) by mouth at bedtime.   . BD PEN NEEDLE NANO U/F 32G X 4 MM MISC Use as directed   . bumetanide (BUMEX) 1 MG tablet Take 1 tablet (1 mg total) by mouth daily.   . carvedilol (COREG) 25 MG tablet Take 1 tablet (25 mg total) by mouth 2 (two) times daily.   . diclofenac sodium (VOLTAREN) 1 % GEL Apply 2 g topically 4 (four) times daily. (Patient taking differently: Apply 2 g topically 4 (four) times daily as needed (pain). )   . doxycycline (VIBRAMYCIN) 100 MG capsule Take 1 capsule (100 mg total) by mouth 2 (two) times daily.   . ferrous sulfate 325 (65 FE) MG tablet Take 325 mg by mouth daily with breakfast.   . glucose blood (ACCU-CHEK GUIDE) test strip Use as instructed to check blood sugars 3 times per day prn dx: e11.22   . HUMALOG KWIKPEN 200 UNIT/ML SOPN Inject 4-8 Units into the skin 3 (three) times daily. Per sliding scale 06/09/2019: Pt used 4 units today   .  isosorbide-hydrALAZINE (BIDIL) 20-37.5 MG tablet Take 1 tablet by mouth 2 (two) times daily.    [name] LEVEMIR FLEXTOUCH 100 UNIT/ML Pen Inject 10-16 Units into the skin daily. 16 units in the am and 10 units in the evening (Patient taking differently: Inject 8-16 Units into the skin daily. 16 units in the am and 8 units in the evening)   . lisinopril (ZESTRIL) 2.5 MG tablet Take 1 tablet (2.5 mg total) by mouth daily.   . magnesium oxide (MAG-OX) 400 MG tablet Take 400 mg by mouth daily.   [name] omeprazole (PRILOSEC) 40 MG capsule TAKE ONE CAPSULE BY MOUTH DAILY BEFORE A MEAL (Patient taking differently: Take 40 mg by mouth daily before breakfast. )   . oxybutynin (DITROPAN) 5 MG tablet Take 1 tablet (5 mg total) by mouth 2 (two) times daily.   . potassium chloride SA (K-DUR) 20 MEQ tablet Take 2 tablets (40 mEq total) by mouth daily.   . pregabalin (LYRICA) 75 MG capsule Take 1 capsule (75 mg total) by mouth at bedtime.   . traMADol (ULTRAM) 50 MG tablet Take 50 mg by mouth 2 (two) times daily as needed for pain.    No facility-administered encounter medications on file as of 06/20/2019.      Follow Up Plan: SW will follow up with patient by phone over the next  10 days.  [name], BSW, CDP Social Worker, Certified Dementia Practitioner [place] [facility] [place] [facility] [phone]

## 2019-06-20 NOTE — Chronic Care Management (AMB) (Signed)
Chronic Care Management   Follow Up Note   06/19/2019 Name: [name] MRN: [id number] DOB: January 22, 1933  Referred by: [name], MD Reason for referral : Chronic Care Management (CCM RNCM Telephone follow up )   [name] is a 85 y.o. year old female who is a primary care patient of [name], MD. The CCM team was consulted for assistance with chronic disease management and care coordination needs.    Review of patient status, including review of consultants reports, relevant laboratory and other test results, and collaboration with appropriate care team members and the patient's provider was performed as part of comprehensive patient evaluation and provision of chronic care management services.    SDOH (Social Determinants of Health) screening performed today: None. See Care Plan for related entries.   I spoke with [name] by telephone today for a CCM follow up.   Outpatient Encounter Medications as of 06/19/2019  Medication Sig Note   acetaminophen (TYLENOL) 500 MG tablet Take 1,000 mg by mouth every 6 (six) hours as needed for mild pain, moderate pain or headache.     amiodarone (PACERONE) 200 MG tablet Take 1 tablet (200 mg total) by mouth daily.    anastrozole (ARIMIDEX) 1 MG tablet Take 1 tablet (1 mg total) by mouth daily.    apixaban (ELIQUIS) 5 MG TABS tablet Take 1 tablet (5 mg total) by mouth 2 (two) times daily.    atorvastatin (LIPITOR) 20 MG tablet Take 1 tablet (20 mg total) by mouth at bedtime.    BD PEN NEEDLE NANO U/F 32G X 4 MM MISC Use as directed    bumetanide (BUMEX) 1 MG tablet Take 1 tablet (1 mg total) by mouth daily.    carvedilol (COREG) 25 MG tablet Take 1 tablet (25 mg total) by mouth 2 (two) times daily.    diclofenac sodium (VOLTAREN) 1 % GEL Apply 2 g topically 4 (four) times daily. (Patient taking differently: Apply 2 g topically 4 (four) times daily as needed (pain). )    doxycycline (VIBRAMYCIN) 100 MG capsule Take 1 capsule  (100 mg total) by mouth 2 (two) times daily.    ferrous sulfate 325 (65 FE) MG tablet Take 325 mg by mouth daily with breakfast.    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 Per sliding scale 06/09/2019: Pt used 4 units today    isosorbide-hydrALAZINE (BIDIL) 20-37.5 MG tablet Take 1 tablet by mouth 2 (two) times daily.     LEVEMIR FLEXTOUCH 100 UNIT/ML Pen Inject 10-16 Units into the skin daily. 16 units in the am and 10 units in the evening (Patient taking differently: Inject 8-16 Units into the skin daily. 16 units in the am and 8 units in the evening)    lisinopril (ZESTRIL) 2.5 MG tablet Take 1 tablet (2.5 mg total) by mouth daily.    magnesium oxide (MAG-OX) 400 MG tablet Take 400 mg by mouth daily.    omeprazole (PRILOSEC) 40 MG capsule TAKE ONE CAPSULE BY MOUTH DAILY BEFORE A MEAL (Patient taking differently: Take 40 mg by mouth daily before breakfast. )    oxybutynin (DITROPAN) 5 MG tablet Take 1 tablet (5 mg total) by mouth 2 (two) times daily.    potassium chloride SA (K-DUR) 20 MEQ tablet Take 2 tablets (40 mEq total) by  mouth daily.    pregabalin (LYRICA) 75 MG capsule Take 1 capsule (75 mg total) by mouth at bedtime.    traMADol (ULTRAM) 50 MG tablet Take 50 mg by mouth 2 (two) times daily as needed for pain.    No facility-administered encounter medications on file as of 06/19/2019.      Goals Addressed      Patient Stated    "I have a [name] bit of a cough" (pt-stated)       Current Barriers:   Knowledge Deficits related to diagnosis and treatment management related to having developed a cough  Risk for COVID-19  Lack of transportation for COVID testing   Nurse Case Manager Clinical Goal(s):   Over the next 14 days, patient will report her "cough" has resolved without developing further s/s  suggestive of COVID-19  CCM RN CM Interventions:  06/19/19 completed call with patient    Discussed patient does not have transportation in order to receive a COVID-19 test, discussed [name] is aware  Discussed patient's cough has improved and she denies having other known symptoms suspicious of COVID-19  Discussed the importance of quarantining in her home for at least 14 days and to wear a mask and keep 30ft away if she has to leave her home; discussed using good handwashing and hand sanitizer appropriately  Evaluation of current treatment plan related to new onset of developing a cough and patient's adherence to plan as established by provider.  Provided education to patient re: signs and symptoms suggestive of COVID-19; discussed when to call the provider, CCM team and or 911 for new or worsening symptoms including fever, increased weakness and shortness of breath  Discussed plans with patient for ongoing care management follow up and provided patient with direct contact information for care management team  Patient Self Care Activities:   Self administers medications as prescribed  Attends all scheduled provider appointments  Calls pharmacy for medication refills  Performs ADL's independently  Performs IADL's independently  Calls provider office for new concerns or questions  Initial goal documentation      "My back, neck and left arm pain has worsened" (pt-stated)   On track    Current Barriers:   Knowledge Deficits related to diagnosis and treatment management for cervical radiculopathy  Nurse Case Manager Clinical Goal(s):   Over the next 30 days, patient will verbalize understanding of plan for long term treatment and or symptom management for cervical radiculopathy  Over the next 90 days, patient will not experience hospital admission. Hospital Admissions in last 6 months = 01/01/19; 06/08/19  CCM RN CM Interventions:  06/19/19 call completed with patient     Evaluation of current treatment plan related to Cervical radiculopathy and associated pain and patient's adherence to plan as established by provider.  Reviewed medications with patient and discussed discussed patient completed a telephonic follow up visit with [name] on 06/18/19 at which time she reviewed her medications and was given the Okay to resume the Lisinopril prescribed by the hospitalist following her most recent IP admission/discharge; patient states she has all medications filled and in her home; she verbalizes having a good understanding about the indication, dosage and frequency of each of her prescribed medications  Discussed plans with patient for ongoing care management follow up and provided patient with direct contact information for care management team  Provided patient with printed educational materials related to Medication management Safety Tool  Reviewed scheduled/upcoming provider appointments including: post discharge follow up visits,  including scheduled visits and recommendations for needed follow up visits  Discussed patient has started in home PT and is satisfied with the therapy she received with her initial PT visit  Encouraged patient to balance her activity with rest, stay well hydrated and take pain medication about 30 minutes before her PT visit to help optimize her ability to participate  Patient Self Care Activities with assistance of her daughter   Self administers medications as prescribed  Attends all scheduled provider appointments  Calls pharmacy for medication refills  Performs ADL's independently  Performs IADL's independently  Calls provider office for new concerns or questions  Please see past updates related to this goal by clicking on the "Past Updates" button in the selected goal        Other    High Risk for Knowledge Deficit for CHF    On track    Current Barriers:   Knowledge Deficits related to disease process  and Self Health management for CHF   Nurse Case Manager Clinical Goal(s):   Over the next 60 days, patient will verbalize basic understanding of CHF disease process and self health management plan as evidenced by patient and caregiver will verbalize better understanding of the disease process for CHF and will verbalize increased understanding of MD recommendations for CHF disease management. 04/04/19 re-established goal date to 60 days due to treatment delays secondary to Corona virus  CCM RN CM Interventions:  06/19/19 completed call with patient    Evaluation of current treatment plan related to CHF and patient's adherence to plan as established by provider.  Provided education to patient re: the importance of completing daily weights each am after voiding and wearing the smallest amount of clothing; discussed recording weights daily; discussed when to call the doctor and or CCM team if needed  Discussed plans with patient for ongoing care management follow up and provided patient with direct contact information for care management team  Provided patient with printed educational materials related to CHF Zone Management Tool  Patient Self Care Activities:   Attends all scheduled provider appointments  Calls pharmacy for medication refills  Calls provider office for new concerns or questions  Please see past updates related to this goal by clicking on the "Past Updates" button in the selected goal           Telephone follow up appointment with care management team member scheduled for: 06/24/19   [name], RN, BSN, CCM Care Management Coordinator [facility]  Direct Phone: [phone]

## 2019-06-20 NOTE — Telephone Encounter (Signed)
Mailbox full.  I was calling the pt because [name] wanted to know how the pt is doing.

## 2019-06-21 ENCOUNTER — Telehealth: Payer: Self-pay

## 2019-06-21 DIAGNOSIS — I130 Hypertensive heart and chronic kidney disease with heart failure and stage 1 through stage 4 chronic kidney disease, or unspecified chronic kidney disease: Secondary | ICD-10-CM | POA: Diagnosis not present

## 2019-06-21 DIAGNOSIS — I69354 Hemiplegia and hemiparesis following cerebral infarction affecting left non-dominant side: Secondary | ICD-10-CM | POA: Diagnosis not present

## 2019-06-21 DIAGNOSIS — I69391 Dysphagia following cerebral infarction: Secondary | ICD-10-CM | POA: Diagnosis not present

## 2019-06-21 DIAGNOSIS — I69351 Hemiplegia and hemiparesis following cerebral infarction affecting right dominant side: Secondary | ICD-10-CM | POA: Diagnosis not present

## 2019-06-21 DIAGNOSIS — R1310 Dysphagia, unspecified: Secondary | ICD-10-CM | POA: Diagnosis not present

## 2019-06-24 ENCOUNTER — Other Ambulatory Visit: Payer: Medicare Other

## 2019-06-24 ENCOUNTER — Telehealth: Payer: Self-pay

## 2019-06-24 ENCOUNTER — Other Ambulatory Visit: Payer: Self-pay

## 2019-06-24 DIAGNOSIS — I69351 Hemiplegia and hemiparesis following cerebral infarction affecting right dominant side: Secondary | ICD-10-CM | POA: Diagnosis not present

## 2019-06-24 DIAGNOSIS — N183 Chronic kidney disease, stage 3 unspecified: Secondary | ICD-10-CM

## 2019-06-24 DIAGNOSIS — I5031 Acute diastolic (congestive) heart failure: Secondary | ICD-10-CM | POA: Diagnosis not present

## 2019-06-24 DIAGNOSIS — I69391 Dysphagia following cerebral infarction: Secondary | ICD-10-CM | POA: Diagnosis not present

## 2019-06-24 DIAGNOSIS — I69354 Hemiplegia and hemiparesis following cerebral infarction affecting left non-dominant side: Secondary | ICD-10-CM | POA: Diagnosis not present

## 2019-06-24 DIAGNOSIS — I130 Hypertensive heart and chronic kidney disease with heart failure and stage 1 through stage 4 chronic kidney disease, or unspecified chronic kidney disease: Secondary | ICD-10-CM | POA: Diagnosis not present

## 2019-06-24 DIAGNOSIS — R1310 Dysphagia, unspecified: Secondary | ICD-10-CM | POA: Diagnosis not present

## 2019-06-25 ENCOUNTER — Telehealth: Payer: Self-pay | Admitting: *Deleted

## 2019-06-25 DIAGNOSIS — N183 Chronic kidney disease, stage 3 unspecified: Secondary | ICD-10-CM

## 2019-06-25 DIAGNOSIS — I5041 Acute combined systolic (congestive) and diastolic (congestive) heart failure: Secondary | ICD-10-CM

## 2019-06-25 LAB — BASIC METABOLIC PANEL
BUN/Creatinine Ratio: 21 (ref 12–28)
BUN: 45 mg/dL — ABNORMAL HIGH (ref 8–27)
CO2: 24 mmol/L (ref 20–29)
Calcium: 8.9 mg/dL (ref 8.7–10.3)
Chloride: 102 mmol/L (ref 96–106)
Creatinine, Ser: 2.13 mg/dL — ABNORMAL HIGH (ref 0.57–1.00)
GFR calc Af Amer: 24 mL/min/{1.73_m2} — ABNORMAL LOW (ref >59)
GFR calc non Af Amer: 21 mL/min/{1.73_m2} — ABNORMAL LOW (ref >59)
Glucose: 270 mg/dL — ABNORMAL HIGH (ref 65–99)
Potassium: 4.3 mmol/L (ref 3.5–5.2)
Sodium: 141 mmol/L (ref 134–144)

## 2019-06-25 LAB — BRAIN NATRIURETIC PEPTIDE: BNP: 159.8 pg/mL — ABNORMAL HIGH (ref 0.0–100.0)

## 2019-06-25 NOTE — Telephone Encounter (Signed)
Patient informed and verbalized understanding of plan. Has already taken bumex today but will hold dose tomorrow. Copy sent to PCP

## 2019-06-25 NOTE — Telephone Encounter (Signed)
-----   Message from Darreld Mclean, [place] sent at 06/25/2019 10:27 AM EDT ----- Please notify patient of results: Kidney function worse since discharge. Looks like patient was discharged on Lisinopril - she should stop this. Would also recommend holding Bumex for one day (if patient has already taken today's dose, can hold tomorrow). Will need repeat BMET on Friday 06/28/2019.   Blood glucose also elevated. She needs to continue to monitor this and follow-up with PCP as directed for management of diabetes.  She already has follow-up in our office scheduled on 07/02/2019.

## 2019-06-26 ENCOUNTER — Ambulatory Visit: Payer: Self-pay

## 2019-06-26 DIAGNOSIS — I69391 Dysphagia following cerebral infarction: Secondary | ICD-10-CM | POA: Diagnosis not present

## 2019-06-26 DIAGNOSIS — I69354 Hemiplegia and hemiparesis following cerebral infarction affecting left non-dominant side: Secondary | ICD-10-CM | POA: Diagnosis not present

## 2019-06-26 DIAGNOSIS — R1310 Dysphagia, unspecified: Secondary | ICD-10-CM | POA: Diagnosis not present

## 2019-06-26 DIAGNOSIS — Z794 Long term (current) use of insulin: Secondary | ICD-10-CM

## 2019-06-26 DIAGNOSIS — E1122 Type 2 diabetes mellitus with diabetic chronic kidney disease: Secondary | ICD-10-CM

## 2019-06-26 DIAGNOSIS — R5381 Other malaise: Secondary | ICD-10-CM

## 2019-06-26 DIAGNOSIS — I69351 Hemiplegia and hemiparesis following cerebral infarction affecting right dominant side: Secondary | ICD-10-CM | POA: Diagnosis not present

## 2019-06-26 DIAGNOSIS — I130 Hypertensive heart and chronic kidney disease with heart failure and stage 1 through stage 4 chronic kidney disease, or unspecified chronic kidney disease: Secondary | ICD-10-CM | POA: Diagnosis not present

## 2019-06-26 NOTE — Patient Instructions (Signed)
Social Worker Visit Information  Goals we discussed today:  Goals Addressed            This Visit's Progress     Patient Stated   . "I need transportation resources to get to the doctor" (pt-stated)       Current Barriers:  . Limited access to transportation resources due to living in [facility] and physician being in [place] . Lacks knowledge of transportation benefit offered by health plan  Clinical Social Work Clinical Goal(s):  [name] Over the next 30 days patient will become more knowledgeable of health plan benefit  Interventions: . Patient interviewed and appropriate assessments performed . Provided patient with information about transportation benefit covered by her health plan provided by [employer] . Mailed the patient a guide on how to access her transportation benefit . Scheduled a follow up call to the patient to confirm receipt of resource  Patient Self Care Activities:  . Currently UNABLE TO independently drive self to physician appointments  Initial goal documentation                Materials Provided: Yes: Mailed the patient information on transportation benefit offerred under health plan benefits  Follow Up Plan: SW will follow up with patient by phone over the next three weeks   [name], BSW, CDP Social Worker, Certified Dementia Practitioner [place] [facility] [phone]

## 2019-06-26 NOTE — Chronic Care Management (AMB) (Signed)
Chronic Care Management    Social Work Follow Up Note  06/26/2019 Name: SAHAR RYBACK MRN: [id number] DOB: 17-Nov-1932  [name] is a 85 y.o. year old female who is a primary care patient of [name], MD. The CCM team was consulted for assistance with Transportation Needs .   Review of patient status, including review of consultants reports, other relevant assessments, and collaboration with appropriate care team members and the patient's provider was performed as part of comprehensive patient evaluation and provision of chronic care management services.    SDOH (Social Determinants of Health) screening performed today: Transportation. See Care Plan for related entries.   Outpatient Encounter Medications as of 06/26/2019  Medication Sig Note  . acetaminophen (TYLENOL) 500 MG tablet Take 1,000 mg by mouth every 6 (six) hours as needed for mild pain, moderate pain or headache.    [name] amiodarone (PACERONE) 200 MG tablet Take 1 tablet (200 mg total) by mouth daily.   [name] anastrozole (ARIMIDEX) 1 MG tablet Take 1 tablet (1 mg total) by mouth daily.   [name] apixaban (ELIQUIS) 5 MG TABS tablet Take 1 tablet (5 mg total) by mouth 2 (two) times daily.   [name] atorvastatin (LIPITOR) 20 MG tablet Take 1 tablet (20 mg total) by mouth at bedtime.   . BD PEN NEEDLE NANO U/F 32G X 4 MM MISC Use as directed   . bumetanide (BUMEX) 1 MG tablet Take 1 tablet (1 mg total) by mouth daily.   . carvedilol (COREG) 25 MG tablet Take 1 tablet (25 mg total) by mouth 2 (two) times daily.   . diclofenac sodium (VOLTAREN) 1 % GEL Apply 2 g topically 4 (four) times daily. (Patient taking differently: Apply 2 g topically 4 (four) times daily as needed (pain). )   . doxycycline (VIBRAMYCIN) 100 MG capsule Take 1 capsule (100 mg total) by mouth 2 (two) times daily.   . ferrous sulfate 325 (65 FE) MG tablet Take 325 mg by mouth daily with breakfast.   . glucose blood (ACCU-CHEK GUIDE) test strip Use as instructed to check blood  sugars 3 times per day prn dx: e11.22   . HUMALOG KWIKPEN 200 UNIT/ML SOPN Inject 4-8 Units into the skin 3 (three) times daily. Per sliding scale 06/09/2019: Pt used 4 units today   . isosorbide-hydrALAZINE (BIDIL) 20-37.5 MG tablet Take 1 tablet by mouth 2 (two) times daily.    [name] LEVEMIR FLEXTOUCH 100 UNIT/ML Pen Inject 10-16 Units into the skin daily. 16 units in the am and 10 units in the evening (Patient taking differently: Inject 8-16 Units into the skin daily. 16 units in the am and 8 units in the evening)   . magnesium oxide (MAG-OX) 400 MG tablet Take 400 mg by mouth daily.   [name] omeprazole (PRILOSEC) 40 MG capsule TAKE ONE CAPSULE BY MOUTH DAILY BEFORE A MEAL (Patient taking differently: Take 40 mg by mouth daily before breakfast. )   . oxybutynin (DITROPAN) 5 MG tablet Take 1 tablet (5 mg total) by mouth 2 (two) times daily.   . potassium chloride SA (K-DUR) 20 MEQ tablet Take 2 tablets (40 mEq total) by mouth daily.   . pregabalin (LYRICA) 75 MG capsule Take 1 capsule (75 mg total) by mouth at bedtime.   . traMADol (ULTRAM) 50 MG tablet Take 50 mg by mouth 2 (two) times daily as needed for pain.    No facility-administered encounter medications on file as of 06/26/2019.  Goals Addressed            This Visit's Progress     Patient Stated   . "I need transportation resources to get to the doctor" (pt-stated)       Current Barriers:  . Limited access to transportation resources due to living in [facility] and physician being in [place] . Lacks knowledge of transportation benefit offered by health plan  Clinical Social Work Clinical Goal(s):  [name] Over the next 30 days patient will become more knowledgeable of health plan benefit  Interventions: . Patient interviewed and appropriate assessments performed . Provided patient with information about transportation benefit covered by her health plan provided by [employer] . Mailed the patient a guide on how to access her  transportation benefit . Scheduled a follow up call to the patient to confirm receipt of resource  Patient Self Care Activities:  . Currently UNABLE TO independently drive self to physician appointments  Initial goal documentation                Follow Up Plan: SW will follow up with patient by phone over the next three weeks to confirm receipt of mailed resource.   [name], BSW, CDP Social Worker, Certified Dementia Practitioner [place] [facility] [place] [facility] [phone]  Total time spent performing care coordination and/or care management activities with the patient by phone or face to face = 9 minutes.

## 2019-06-27 ENCOUNTER — Ambulatory Visit: Payer: Self-pay | Admitting: Pharmacist

## 2019-06-27 DIAGNOSIS — I5032 Chronic diastolic (congestive) heart failure: Secondary | ICD-10-CM

## 2019-06-27 DIAGNOSIS — R1310 Dysphagia, unspecified: Secondary | ICD-10-CM | POA: Diagnosis not present

## 2019-06-27 DIAGNOSIS — I130 Hypertensive heart and chronic kidney disease with heart failure and stage 1 through stage 4 chronic kidney disease, or unspecified chronic kidney disease: Secondary | ICD-10-CM

## 2019-06-27 DIAGNOSIS — I69354 Hemiplegia and hemiparesis following cerebral infarction affecting left non-dominant side: Secondary | ICD-10-CM | POA: Diagnosis not present

## 2019-06-27 DIAGNOSIS — I69391 Dysphagia following cerebral infarction: Secondary | ICD-10-CM | POA: Diagnosis not present

## 2019-06-27 DIAGNOSIS — I69351 Hemiplegia and hemiparesis following cerebral infarction affecting right dominant side: Secondary | ICD-10-CM | POA: Diagnosis not present

## 2019-06-27 DIAGNOSIS — N184 Chronic kidney disease, stage 4 (severe): Secondary | ICD-10-CM

## 2019-06-27 DIAGNOSIS — E1122 Type 2 diabetes mellitus with diabetic chronic kidney disease: Secondary | ICD-10-CM

## 2019-06-28 ENCOUNTER — Other Ambulatory Visit: Payer: Self-pay

## 2019-06-28 ENCOUNTER — Other Ambulatory Visit: Payer: Medicare Other | Admitting: *Deleted

## 2019-06-28 DIAGNOSIS — I5041 Acute combined systolic (congestive) and diastolic (congestive) heart failure: Secondary | ICD-10-CM

## 2019-06-28 DIAGNOSIS — N183 Chronic kidney disease, stage 3 unspecified: Secondary | ICD-10-CM

## 2019-06-29 LAB — BASIC METABOLIC PANEL
BUN/Creatinine Ratio: 24 (ref 12–28)
BUN: 44 mg/dL — ABNORMAL HIGH (ref 8–27)
CO2: 26 mmol/L (ref 20–29)
Calcium: 9.2 mg/dL (ref 8.7–10.3)
Chloride: 101 mmol/L (ref 96–106)
Creatinine, Ser: 1.87 mg/dL — ABNORMAL HIGH (ref 0.57–1.00)
GFR calc Af Amer: 28 mL/min/{1.73_m2} — ABNORMAL LOW (ref >59)
GFR calc non Af Amer: 24 mL/min/{1.73_m2} — ABNORMAL LOW (ref >59)
Glucose: 289 mg/dL — ABNORMAL HIGH (ref 65–99)
Potassium: 4.5 mmol/L (ref 3.5–5.2)
Sodium: 141 mmol/L (ref 134–144)

## 2019-07-01 ENCOUNTER — Encounter: Payer: Self-pay | Admitting: Physician Assistant

## 2019-07-01 NOTE — Progress Notes (Addendum)
Cardiology Office Note    Date:  07/02/2019   ID:  [name], DOB 1933/02/26, MRN [id number]  PCP:  [name], MD  Cardiologist:  Larae Grooms, MD  Electrophysiologist:  None   Chief Complaint: f/u hospitalization for CHF  History of Present Illness:   [name] is a 85 y.o. female with history of h/o DVT (on anticoagulation), chronic combined (?) CHF, deconditioning, obesity, HTN, SVT (previously correlated with pre-syncope, on amiodarone), DM, chronic anemia/thrombocytopenia, LBBB, CKD stage III, CVA, HLD, PPM in [place] in 2020, mild mitral regurgitation and small pericardial effusion by echo 05/2019 who presents for post-hospital follow-up.  Per notes in 2013 she had an EPS with ablation for SVT which did not eliminate the SVT completely. She also had bradycardia which limited medication. She was placed on amiodarone by Dr. Lovena Le. Per [name], she was admitted several times in [place] and [place] earlier in 2020 and received a pacemaker. Her discharge paperwork also contains diagnoses of cardiomyopathy, acute on chronic systolic CHF, paroxysmal atrial fibrillation, and COPD but we do not have any further details on this. At some point her Lasix was changed to Bumex, carvedilol was reduced, and she was placed on Bidil. This makes me suspect she had dropped her EF at one point. She followed with nephrology and cardiology in [place] and also has established care with [facility] as well. There are records in [place] but these only appear to be patient discharge instructions rather than provider notes.  She was recently admitted 06/08/2019 with shortness of breath and arm pain, found to have acute on chronic diastolic CHF requiring diuresis. Her troponin was elevated to a peak of 310, felt due to demand ischemia in absence of true angina. Left arm pain was felt MSK, supported by plain films. No further ischemic workup was recommended. Age adjusted  D-dimer was felt wnl. Per Dr. Lovena Le, device appeared to be working normally and did not need it interrogated during admission but routine EP f/u was recommended. 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 Per Dr. Francesca Oman note, "In the absence of fever or sign of sepsis I would not pursue further work-up at this point given the patient age and comorbidities.  I would consider TEE once any signs of infection." She recommended to restart oral Bumex after a 1 day holiday from diuresis. F/u Cr was still elevated on f/u labs 8/31 so lisinopril was discontinued. F/u BMET 06/28/19 showed K 4.5, Cr 1.87 (previous range variable from 1.4-2.3). Otherwise recent labs 05/2019 showed Hgb 8.9 (variable in 9-10 range this year), Plt 105 (variable in 2020), WBC 2.4, A1C 7.9, 07/2018 LDL 96.   She continues to struggle with significant lower extremity edema which she states is somewhat worse from recent hospitalization. She is taking 1.5 tablets of Bumex in AM and 1 tablet in PM. They are unsure how much total fluid she drinks, as she gets it from different sources (ginger ale, water, coffee). She follows a low sodium diet. She snores and has been noted to gasp when she sleeps but she herself was unaware of this. She never smoked so she and her family are not sure where prior diagnoses of COPD came from on her paperwork from [place]. She has not had a prior sleep study. She denies any symptoms suggestive of angina.   Past Medical History:  Diagnosis Date   Abnormal echocardiogram    a. possible mass on echo 05/2019 on PPM -  Per Dr. Francesca Oman note, " In the absence of fever or sign of sepsis I would not pursue further work-up at this point given the patient age and comorbidities.  I would consider TEE once any signs of infection."    Anemia    Arthritis      Cancer (HCC)    Cataract    Chronic combined systolic and diastolic CHF (congestive heart failure) ([place])    a. Previously diastolic but patient AVS from outside hospital listed systolic CHF and cardiomyopathy, records pending.   CKD (chronic kidney disease), stage III (HCC)    Clotting disorder (HCC)    CVA (cerebral vascular accident) ([place])    residual right sided weakness and mild dysphagia   Diabetes mellitus ([place])    Dizziness and giddiness    DVT (deep venous thrombosis) ([place])    a. on anticoagulation for this.   Essential hypertension    LBBB (left bundle branch block)    Mitral regurgitation    a. Mod MR by echo 2014.   Muscular deconditioning    Obesity    Pacemaker    Pericardial effusion    SVT (supraventricular tachycardia) ([place])    a. In 2013 she had an EPS with ablation for SVT which did not eliminate the SVT completely. She also had bradycardia which limited medication. She was placed on amiodarone by Dr. Lovena Le.   Thrombocytopenia ([place]) 11/21/2011    Past Surgical History:  Procedure Laterality Date   EYE SURGERY     SUPRAVENTRICULAR TACHYCARDIA ABLATION N/A 10/11/2012   Procedure: SUPRAVENTRICULAR TACHYCARDIA ABLATION;  Surgeon: Evans Lance, MD;  [facility] C[facility] LAB;  Service: Cardiovascular;  Laterality: N/A;    Current Medications: Current Meds  Medication Sig   acetaminophen (TYLENOL) 500 MG tablet Take 1,000 mg by mouth every 6 (six) hours as needed for mild pain, moderate pain or headache.    amiodarone (PACERONE) 200 MG tablet Take 1 tablet (200 mg total) by mouth daily.   anastrozole (ARIMIDEX) 1 MG tablet Take 1 tablet (1 mg total) by mouth daily.   apixaban (ELIQUIS) 5 MG TABS tablet Take 1 tablet (5 mg total) by mouth 2 (two) times daily.   atorvastatin (LIPITOR) 20 MG tablet Take 1 tablet (20 mg total) by mouth at bedtime.   BD PEN NEEDLE NANO U/F 32G X 4 MM MISC Use as directed   bumetanide (BUMEX) 1 MG tablet Take  1 tablet (1 mg total) by mouth daily. (Patient taking differently: Take 1 mg by mouth daily. Take 1.5 tablets in the morning and 1 tablet in the evening.)   carvedilol (COREG) 12.5 MG tablet Take 12.5 mg by mouth 2 (two) times daily with a meal.   diclofenac sodium (VOLTAREN) 1 % GEL Apply 2 g topically 4 (four) times daily.   doxycycline (VIBRAMYCIN) 100 MG capsule Take 1 capsule (100 mg total) by mouth 2 (two) times daily.   ferrous sulfate 325 (65 FE) MG tablet Take 325 mg by mouth daily with breakfast.   glucose blood (ACCU-CHEK GUIDE) test strip Use as instructed to check blood sugars 3 times per day prn dx: e11.22   HUMALOG KWIKPEN 200 UNIT/ML SOPN Inject 4-8 Units into the skin 3 (three) times daily. Per sliding scale   isosorbide-hydrALAZINE (BIDIL) 20-37.5 MG tablet Take 1 tablet by mouth 3 (three) times daily.    LEVEMIR FLEXTOUCH 100 UNIT/ML  Pen Inject 10-16 Units into the skin daily. 16 units in the am and 10 units in the evening (Patient taking differently: Inject 8-16 Units into the skin daily. 16 units in the am and 8 units in the evening)   magnesium oxide (MAG-OX) 400 MG tablet Take 400 mg by mouth daily.   omeprazole (PRILOSEC) 40 MG capsule TAKE ONE CAPSULE BY MOUTH DAILY BEFORE A MEAL   oxybutynin (DITROPAN) 5 MG tablet Take 1 tablet (5 mg total) by mouth 2 (two) times daily.   potassium chloride SA (K-DUR) 20 MEQ tablet Take 2 tablets (40 mEq total) by mouth daily.   pregabalin (LYRICA) 75 MG capsule Take 1 capsule (75 mg total) by mouth at bedtime.   traMADol (ULTRAM) 50 MG tablet Take 50 mg by mouth 2 (two) times daily as needed for pain.     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 [employer] insecurity    Worry: Never true    Inability: Never true   Transportation needs    Medical: No    Non-medical: No  Tobacco Use   Smoking status: Never Smoker   Smokeless tobacco: Never Used  Substance and Sexual Activity   Alcohol use: No   Drug use: No   Sexual activity: Not Currently  Lifestyle   Physical activity    Days per week: 0 days    Minutes per session: 0 min   Stress: Not at all  Relationships   Social connections    Talks on phone: Not on file    Gets together: Not on file    Attends religious service: Not on file    Active member of club or organization: Not on file    Attends meetings of clubs or organizations: Not on file    Relationship status: Not on file  Other Topics Concern   Not on file  Social History Narrative   Lives at home alone   She has a nursing aid who comes to her home for 2.5 hrs/day x 4 days/week    Right handed   1 cup caffeine daily     Family History:  The patient's family history includes Breast cancer in her maternal aunt; Cancer in her father; Heart attack in her brother; Hypertension in her brother, daughter, and sister; Stroke in her mother and sister.  ROS:   Please see the history of present illness.  All other systems are reviewed and otherwise negative.    EKGs/Labs/Other Studies Reviewed:    Studies reviewed were summarized above.   EKG:  EKG is not ordered today. Reviewed from 8/15, NSR with LBBB.  Recent Labs: 08/06/2018: NT-Pro BNP 1,604 06/08/2019: ALT 30 06/11/2019: Hemoglobin 8.9; Platelets 105 06/24/2019: BNP 159.8 06/28/2019: BUN 44; Creatinine, Ser 1.87; Potassium 4.5; Sodium 141  Recent Lipid Panel    Component Value Date/Time   CHOL 195 08/15/2018 1236   TRIG 56 01/02/2019 1425   HDL 81 08/15/2018 1236   CHOLHDL 2.4 08/15/2018 1236   CHOLHDL 2.6 12/11/2008 0130   VLDL 13 12/11/2008 0130   LDLCALC 96 08/15/2018 1236    PHYSICAL EXAM:  VS:  BP 122/60    Pulse 64    Ht 5' (1.524 m)    Wt 187 lb (84.8 kg)    SpO2 96%    BMI 36.52 kg/m   BMI: Body mass index is 36.52 kg/m.  GEN: Well nourished, well developed AAF, in no acute distress HEENT: normocephalic, atraumatic Neck: no JVD, carotid bruits, or masses Cardiac: RRR; no murmurs, rubs, or gallops, 2+ diffuse BLE edema of lower extremities, stalk-like into shoes Respiratory:  clear to auscultation bilaterally, normal work of breathing GI: soft, nontender, nondistended, + BS MS: no deformity or atrophy Skin: warm and dry, no rash Neuro:  Alert and Oriented x 3, Strength and sensation are intact, follows commands Psych: euthymic mood, full affect  Wt Readings from Last 3 Encounters:  07/02/19 187 lb (84.8 kg)  06/11/19 187 lb 13.3 oz (85.2 kg)  05/21/19 188 lb 9.6 oz (85.5 kg)     ASSESSMENT & PLAN:   1. Acute on chronic diastolic CHF with right heart failure - by records, RV dysfunction appears new - unclear cause. Will arrange sleep study and PFTs to start. She is already on anticoagulation for history of DVT. Also with question of afib on [place] AVS (?) - no provider records available. Will check repeat labs today off lisinopril. If Cr is stable, will need to consider changing Bumex to torsemide since she does not seem to be responding well to the Bumex. Given that her legs are likely too large to comfortably use compression hose, advised daughter assist with ACE wraps in AM all the way up to knee and elevate legs above heart level whenever seated. Reviewed 2g sodium restriction, 2L fluid restriction with patient. 2. History of SVT and PPM - check thyroid and PFTs given ongoing amiodarone use. Will need EP f/u.  3. AKI on CKD stage III - denies any other nephrotoxic meds. Check labs today. If Cr remains >1.5, will need dose reduction in Eliquis to 2.5mg  BID. 4. Abnormal echocardiogram - will order blood cultures for completeness. If negative, Dr. Meda Coffee did not  feel any further imaging was necessary at this time. 5. Small pericardial effusion - follow clinically for now. Not felt to be of hemodynamic significance during recent hospitalization. She is not tachycardic or hypotensive today.  Disposition: F/u with Dr. Irish Lack in 1-2 weeks (pt's family wishes to see MD rather than APP). Will ask nursing staff to try and get records from outside hospitals - there are records scanned in [place] but these appear to be patient discharge instructions. Will also recommend f/u with EP as suggested with Dr. Lovena Le for routine monitoring of pacemaker.  Medication Adjustments/Labs and Tests Ordered: Current medicines are reviewed at length with the patient today.  Concerns regarding medicines are outlined above. Medication changes, Labs and Tests ordered today are summarized above and listed in the Patient Instructions accessible in Encounters. Did not bill TOC as I do not see patient received phone call.  Signed, Charlie Pitter, PA-C  07/02/2019 10:41 AM    [facility] [address], [place], [place]  [address] Phone: [phone]; Fax: [phone]

## 2019-07-02 ENCOUNTER — Ambulatory Visit (INDEPENDENT_AMBULATORY_CARE_PROVIDER_SITE_OTHER): Payer: Medicare Other | Admitting: Physician Assistant

## 2019-07-02 ENCOUNTER — Other Ambulatory Visit: Payer: Self-pay

## 2019-07-02 ENCOUNTER — Encounter: Payer: Self-pay | Admitting: Physician Assistant

## 2019-07-02 ENCOUNTER — Telehealth: Payer: Self-pay | Admitting: *Deleted

## 2019-07-02 VITALS — BP 122/60 | HR 64 | Ht 60.0 in | Wt 187.0 lb

## 2019-07-02 DIAGNOSIS — I313 Pericardial effusion (noninflammatory): Secondary | ICD-10-CM

## 2019-07-02 DIAGNOSIS — R1310 Dysphagia, unspecified: Secondary | ICD-10-CM | POA: Diagnosis not present

## 2019-07-02 DIAGNOSIS — N189 Chronic kidney disease, unspecified: Secondary | ICD-10-CM

## 2019-07-02 DIAGNOSIS — R931 Abnormal findings on diagnostic imaging of heart and coronary circulation: Secondary | ICD-10-CM

## 2019-07-02 DIAGNOSIS — K219 Gastro-esophageal reflux disease without esophagitis: Secondary | ICD-10-CM | POA: Diagnosis not present

## 2019-07-02 DIAGNOSIS — N179 Acute kidney failure, unspecified: Secondary | ICD-10-CM

## 2019-07-02 DIAGNOSIS — I50810 Right heart failure, unspecified: Secondary | ICD-10-CM | POA: Diagnosis not present

## 2019-07-02 DIAGNOSIS — I3139 Other pericardial effusion (noninflammatory): Secondary | ICD-10-CM

## 2019-07-02 DIAGNOSIS — I471 Supraventricular tachycardia: Secondary | ICD-10-CM

## 2019-07-02 DIAGNOSIS — I5043 Acute on chronic combined systolic (congestive) and diastolic (congestive) heart failure: Secondary | ICD-10-CM

## 2019-07-02 DIAGNOSIS — D509 Iron deficiency anemia, unspecified: Secondary | ICD-10-CM | POA: Diagnosis not present

## 2019-07-02 DIAGNOSIS — R933 Abnormal findings on diagnostic imaging of other parts of digestive tract: Secondary | ICD-10-CM | POA: Diagnosis not present

## 2019-07-02 LAB — BASIC METABOLIC PANEL
BUN/Creatinine Ratio: 21 (ref 12–28)
BUN: 33 mg/dL — ABNORMAL HIGH (ref 8–27)
CO2: 31 mmol/L — ABNORMAL HIGH (ref 20–29)
Calcium: 9.1 mg/dL (ref 8.7–10.3)
Chloride: 101 mmol/L (ref 96–106)
Creatinine, Ser: 1.58 mg/dL — ABNORMAL HIGH (ref 0.57–1.00)
GFR calc Af Amer: 34 mL/min/{1.73_m2} — ABNORMAL LOW (ref >59)
GFR calc non Af Amer: 30 mL/min/{1.73_m2} — ABNORMAL LOW (ref >59)
Glucose: 104 mg/dL — ABNORMAL HIGH (ref 65–99)
Potassium: 4 mmol/L (ref 3.5–5.2)
Sodium: 141 mmol/L (ref 134–144)

## 2019-07-02 NOTE — Progress Notes (Signed)
  Chronic Care Management   Outreach Note  06/27/2019 Name: [name] MRN: [id number] DOB: November 25, 1932  Referred by: [name], MD Reason for referral : Chronic Care Management   An unsuccessful telephone outreach was attempted today. The patient was referred to the case management team by for assistance with chronic care management and care coordination.   Follow Up Plan: The care management team will reach out to the patient again over the next 5-7 business days.     Regina Eck, PharmD, BCPS Clinical Pharmacist, [facility] [place]: [phone]

## 2019-07-02 NOTE — Telephone Encounter (Signed)
Call placed to pt re: lab results.  Left a message for pt to call back.  

## 2019-07-02 NOTE — Telephone Encounter (Signed)
-----   Message from Charlie Pitter, [place] sent at 07/02/2019  4:24 PM EDT ----- Addendum: additionally, since Cr is >1.5, need to decrease dose of Eliquis to 2.5mg  BID. It is not clear to me what direction her kidney function will go, so for now she can cut the tablets she has in half and please send in new rx to pharmacy while we trend this. Dayna Dunn PA-c

## 2019-07-02 NOTE — Patient Instructions (Addendum)
Medication Instructions:  Your physician recommends that you continue on your current medications as directed. Please refer to the Current Medication list given to you today.  If you need a refill on your cardiac medications before your next appointment, please call your pharmacy.   Lab work: TODAY:  STAT BMET, MAG, CBC, FREE T4, TSH, & BLOOD CULTURES  If you have labs (blood work) drawn today and your tests are completely normal, you will receive your results only by: [name] MyChart Message (if you have MyChart) OR . A paper copy in the mail If you have any lab test that is abnormal or we need to change your treatment, we will call you to review the results.  Testing/Procedures: Your physician has recommended that you have a sleep study. This test records several body functions during sleep, including: brain activity, eye movement, oxygen and carbon dioxide blood levels, heart rate and rhythm, breathing rate and rhythm, the flow of air through your mouth and nose, snoring, body muscle movements, and chest and belly movement.  Your physician has recommended that you have a pulmonary function test. Pulmonary Function Tests are a group of tests that measure how well air moves in and out of your lungs.     Follow-Up: At [facility], you and your health needs are our priority.  As part of our continuing mission to provide you with exceptional heart care, we have created designated Provider Care Teams.  These Care Teams include your primary Cardiologist (physician) and Advanced Practice Providers (APPs -  Physician Assistants and Nurse Practitioners) who all work together to provide you with the care you need, when you need it. . You are scheduled to see Dr. Irish Lack 07/08/2019 at 11:40.   Please make sure to arrive 15 minutes early. Your physician recommends that you schedule a follow-up appointment in: [place]. (pt has [place] pac[place] from another [place] since last seen  here)   Any Other Special Instructions Will Be Listed Below (If Applicable).  I recommend you wrapping your legs in an Ace Bandage before you get out of bed every morning.

## 2019-07-03 ENCOUNTER — Telehealth: Payer: Self-pay

## 2019-07-03 DIAGNOSIS — R1310 Dysphagia, unspecified: Secondary | ICD-10-CM | POA: Diagnosis not present

## 2019-07-03 DIAGNOSIS — I130 Hypertensive heart and chronic kidney disease with heart failure and stage 1 through stage 4 chronic kidney disease, or unspecified chronic kidney disease: Secondary | ICD-10-CM | POA: Diagnosis not present

## 2019-07-03 DIAGNOSIS — I69351 Hemiplegia and hemiparesis following cerebral infarction affecting right dominant side: Secondary | ICD-10-CM | POA: Diagnosis not present

## 2019-07-03 DIAGNOSIS — I69354 Hemiplegia and hemiparesis following cerebral infarction affecting left non-dominant side: Secondary | ICD-10-CM | POA: Diagnosis not present

## 2019-07-03 DIAGNOSIS — I69391 Dysphagia following cerebral infarction: Secondary | ICD-10-CM | POA: Diagnosis not present

## 2019-07-03 LAB — CBC
Hematocrit: 30.2 % — ABNORMAL LOW (ref 34.0–46.6)
Hemoglobin: 10 g/dL — ABNORMAL LOW (ref 11.1–15.9)
MCH: 30.1 pg (ref 26.6–33.0)
MCHC: 33.1 g/dL (ref 31.5–35.7)
MCV: 91 fL (ref 79–97)
Platelets: 111 10*3/uL — ABNORMAL LOW (ref 150–450)
RBC: 3.32 x10E6/uL — ABNORMAL LOW (ref 3.77–5.28)
RDW: 13.6 % (ref 11.7–15.4)
WBC: 3.2 10*3/uL — ABNORMAL LOW (ref 3.4–10.8)

## 2019-07-03 LAB — TSH: TSH: 2.14 u[IU]/mL (ref 0.450–4.500)

## 2019-07-03 LAB — MAGNESIUM: Magnesium: 2.3 mg/dL (ref 1.6–2.3)

## 2019-07-03 LAB — T4, FREE: Free T4: 1.36 ng/dL (ref 0.82–1.77)

## 2019-07-03 NOTE — Telephone Encounter (Signed)
-----   Message from Charlie Pitter, [place] sent at 07/03/2019  9:29 AM EDT ----- Additional labs have resulted (aside from stat BMET yesterday). Please let patient know blood count appears stable but remains abnormal with low white and red blood cell count along with low platelets. I do not see that this would be acutely related to her cardiac status but would recommend attention by her PCP, I will cc her here - may need heme eval. Dayna Dunn PA-C

## 2019-07-03 NOTE — Telephone Encounter (Signed)
Called pt re: lab results, left a message for pt to call back.  

## 2019-07-04 DIAGNOSIS — I69354 Hemiplegia and hemiparesis following cerebral infarction affecting left non-dominant side: Secondary | ICD-10-CM | POA: Diagnosis not present

## 2019-07-04 DIAGNOSIS — I130 Hypertensive heart and chronic kidney disease with heart failure and stage 1 through stage 4 chronic kidney disease, or unspecified chronic kidney disease: Secondary | ICD-10-CM | POA: Diagnosis not present

## 2019-07-04 DIAGNOSIS — I69351 Hemiplegia and hemiparesis following cerebral infarction affecting right dominant side: Secondary | ICD-10-CM | POA: Diagnosis not present

## 2019-07-04 DIAGNOSIS — R1310 Dysphagia, unspecified: Secondary | ICD-10-CM | POA: Diagnosis not present

## 2019-07-04 DIAGNOSIS — I69391 Dysphagia following cerebral infarction: Secondary | ICD-10-CM | POA: Diagnosis not present

## 2019-07-04 MED ORDER — APIXABAN 2.5 MG PO TABS: 2.5000 mg | ORAL_TABLET | Freq: Two times a day (BID) | ORAL | 11 refills | Status: DC

## 2019-07-04 MED ORDER — TORSEMIDE 20 MG PO TABS: ORAL_TABLET | ORAL | 3 refills | Status: DC

## 2019-07-05 ENCOUNTER — Ambulatory Visit: Payer: Self-pay

## 2019-07-05 DIAGNOSIS — I5032 Chronic diastolic (congestive) heart failure: Secondary | ICD-10-CM

## 2019-07-05 DIAGNOSIS — Z794 Long term (current) use of insulin: Secondary | ICD-10-CM

## 2019-07-05 DIAGNOSIS — I130 Hypertensive heart and chronic kidney disease with heart failure and stage 1 through stage 4 chronic kidney disease, or unspecified chronic kidney disease: Secondary | ICD-10-CM | POA: Diagnosis not present

## 2019-07-05 DIAGNOSIS — I69391 Dysphagia following cerebral infarction: Secondary | ICD-10-CM | POA: Diagnosis not present

## 2019-07-05 DIAGNOSIS — E1122 Type 2 diabetes mellitus with diabetic chronic kidney disease: Secondary | ICD-10-CM

## 2019-07-05 DIAGNOSIS — I69351 Hemiplegia and hemiparesis following cerebral infarction affecting right dominant side: Secondary | ICD-10-CM | POA: Diagnosis not present

## 2019-07-05 DIAGNOSIS — I69354 Hemiplegia and hemiparesis following cerebral infarction affecting left non-dominant side: Secondary | ICD-10-CM | POA: Diagnosis not present

## 2019-07-05 DIAGNOSIS — R1310 Dysphagia, unspecified: Secondary | ICD-10-CM | POA: Diagnosis not present

## 2019-07-05 NOTE — Chronic Care Management (AMB) (Signed)
Chronic Care Management   Follow Up Note   07/05/2019 Name: [name] MRN: [id number] DOB: 08/07/1933  Referred by: [name], MD Reason for referral : Chronic Care Management (CCM RNCM Telephone Outreach )   [name] is a 85 y.o. year old female who is a primary care patient of [name], MD. The CCM team was consulted for assistance with chronic disease management and care coordination needs.    Review of patient status, including review of consultants reports, relevant laboratory and other test results, and collaboration with appropriate care team members and the patient's provider was performed as part of comprehensive patient evaluation and provision of chronic care management services.    SDOH (Social Determinants of Health) screening performed today: None. See Care Plan for related entries.   I spoke with [name] by telephone to follow up on her CHF.   Outpatient Encounter Medications as of 07/05/2019  Medication Sig  . acetaminophen (TYLENOL) 500 MG tablet Take 1,000 mg by mouth every 6 (six) hours as needed for mild pain, moderate pain or headache.   [name] amiodarone (PACERONE) 200 MG tablet Take 1 tablet (200 mg total) by mouth daily.  [name] anastrozole (ARIMIDEX) 1 MG tablet Take 1 tablet (1 mg total) by mouth daily.  [name] apixaban (ELIQUIS) 2.5 MG TABS tablet Take 1 tablet (2.5 mg total) by mouth 2 (two) times daily.  [name] atorvastatin (LIPITOR) 20 MG tablet Take 1 tablet (20 mg total) by mouth at bedtime. (Patient not taking: Reported on 07/05/2019)  . BD PEN NEEDLE NANO U/F 32G X 4 MM MISC Use as directed  . carvedilol (COREG) 12.5 MG tablet Take 12.5 mg by mouth 2 (two) times daily with a meal.  . diclofenac sodium (VOLTAREN) 1 % GEL Apply 2 g topically 4 (four) times daily.  [name] doxycycline (VIBRAMYCIN) 100 MG capsule Take 1 capsule (100 mg total) by mouth 2 (two) times daily.  . ferrous sulfate 325 (65 FE) MG tablet Take 325 mg by mouth daily with breakfast.  .  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 HUMALOG KWIKPEN 200 UNIT/ML SOPN Inject 4-8 Units into the skin 3 (three) times daily. Per sliding scale  . isosorbide-hydrALAZINE (BIDIL) 20-37.5 MG tablet Take 1 tablet by mouth 3 (three) times daily.   [name] LEVEMIR FLEXTOUCH 100 UNIT/ML Pen Inject 10-16 Units into the skin daily. 16 units in the am and 10 units in the evening (Patient taking differently: Inject 8-16 Units into the skin daily. 16 units in the am and 8 units in the evening)  . magnesium oxide (MAG-OX) 400 MG tablet Take 400 mg by mouth daily.  [name] omeprazole (PRILOSEC) 40 MG capsule TAKE ONE CAPSULE BY MOUTH DAILY BEFORE A MEAL  . oxybutynin (DITROPAN) 5 MG tablet Take 1 tablet (5 mg total) by mouth 2 (two) times daily.  . potassium chloride SA (K-DUR) 20 MEQ tablet Take 2 tablets (40 mEq total) by mouth daily.  . pregabalin (LYRICA) 75 MG capsule Take 1 capsule (75 mg total) by mouth at bedtime.  . torsemide (DEMADEX) 20 MG tablet Take 2 tablets by mouth twice a day X's 3 days then take 2 tablets by mouth in the a.m. and 1 tablet by mouth in the p.m  . traMADol (ULTRAM) 50 MG tablet Take 50 mg by mouth 2 (two) times daily as needed for pain.   No facility-administered encounter medications  on file as of 07/05/2019.      Goals Addressed      Patient Stated   . "I am having pain in my ankle" (pt-stated)       Current Barriers:  [name] Knowledge Deficits related to diagnosis and treatment of right ankle pain and swelling  Nurse Case Manager Clinical Goal(s):  [name] Over the next 30 days, patient will verbalize understanding of plan for evaluation and treatment of right ankle  CCM RN CM Interventions:  07/05/19 call completed with patient  . Evaluation of current treatment plan related to ankle pain and swelling and patient's adherence to plan as established by provider. . Advised patient to keep this extremity elevated  while resting; use the pivot technique for transferring from W/C as directed by PT; keep ankle wrapped with ACE bandage as instructed by PT and continue to use heat/ice to affected area as instructed by PT; patient encouraged to discuss this further with [name] at next visit . Collaborated with provider [name] regarding patient's complaints of right ankle pain and swelling . Discussed plans with patient for ongoing care management follow up and provided patient with direct contact information for care management team  Patient Self Care Activities:  . Self administers medications as prescribed . Attends all scheduled provider appointments . Calls pharmacy for medication refills . Performs ADL's independently . Performs IADL's independently . Calls provider office for new concerns or questions  Initial goal documentation     . COMPLETED: "I have a [name] bit of a cough" (pt-stated)       Current Barriers:  [name] Knowledge Deficits related to diagnosis and treatment management related to having developed a cough . Risk for COVID-19 . Lack of transportation for COVID testing   Nurse Case Manager Clinical Goal(s):  [name] Over the next 14 days, patient will report her "cough" has resolved without developing further s/s suggestive of COVID-19  CCM RN CM Interventions:  07/05/19 completed call with patient   . Evaluation of current treatment plan related to new onset of developing a cough and patient's adherence to plan as established by provider. Cough resolved   Patient Self Care Activities:  . Self administers medications as prescribed . Attends all scheduled provider appointments . Calls pharmacy for medication refills . Performs ADL's independently . Performs IADL's independently . Calls provider office for new concerns or questions  Initial goal documentation       Other   . Marland KitchenHigh Risk for Knowledge Deficit for CHF        Current Barriers:  [name] Knowledge Deficits related to  disease process and Self Health management for CHF   Nurse Case Manager Clinical Goal(s):  [name] Over the next 60 days, patient will verbalize basic understanding of CHF disease process and self health management plan as evidenced by patient and caregiver will verbalize better understanding of the disease process for CHF and will verbalize increased understanding of MD recommendations for CHF disease management. 04/04/19 re-established goal date to 60 days due to treatment delays secondary to Corona virus Goal Met . 07/05/19 Over the next 60 days, patient will experience no IP or ED events secondary to exacerbation of CHF.  CCM RN CM Interventions:  07/05/19 completed call with patient   . Evaluation of current treatment plan related to CHF and patient's adherence to plan as established by provider. . Provided education to patient re: importance of performing daily weights each am after voiding and wearing the smallest amount of clothing;  discussed recording weights daily; patient reports her weight on 07/04/19 was 184 lbs; she was unable to weigh today due to being unable to bare weight on her right ankle - PT has evaluated and is concerned patient has a sprain; patient is elevating and wrapping with an Ace wrap  Reviewed and discussed the following medication changes directed by [facility]; Torsemide 20 MG Take 2 tablets by mouth twice a day X's 3 days then take 2 tablets by mouth in the a.m. and 1 tablet by mouth in the p.m; Apixaban 2.5 mg Oral 2 times daily ;    (Discontinued by provider); Patient taking differently:  Take 1 mg by mouth daily. Take 1.5 tablets in the morning and 1 tablet in the evening.  Discussed plans with patient for ongoing care management follow up and provided patient with direct contact information for care management team . Discussed next scheduled OV with Cardiology is scheduled for 07/08/19 '@11' :40 AM . Discussed next scheduled OV with [name] is scheduled for  07/09/19 '@2'  PM  Patient Self Care Activities:  . Attends all scheduled provider appointments . Calls pharmacy for medication refills . Calls provider office for new concerns or questions  Please see past updates related to this goal by clicking on the "Past Updates" button in the selected goal           Telephone follow up appointment with care management team member scheduled for: 08/06/19   [name], RN, BSN, CCM Care Management Coordinator [facility]  Direct Phone: [phone]

## 2019-07-05 NOTE — Patient Instructions (Signed)
Visit Information  Goals Addressed      Patient Stated   . "I am having pain in my ankle" (pt-stated)       Current Barriers:  [name] Knowledge Deficits related to diagnosis and treatment of right ankle pain and swelling  Nurse Case Manager Clinical Goal(s):  [name] Over the next 30 days, patient will verbalize understanding of plan for evaluation and treatment of right ankle  Interventions:  . Evaluation of current treatment plan related to ankle pain and swelling and patient's adherence to plan as established by provider. . Advised patient to keep this extremity elevated while resting; use the pivot technique for transferring from W/C as directed by PT; keep ankle wrapped with ACE bandage as instructed by PT and continue to use heat/ice to affected area as instructed by PT; patient encouraged to discuss this further with [name] at next visit . Collaborated with provider [name] regarding patient's complaints of right ankle pain and swelling . Discussed plans with patient for ongoing care management follow up and provided patient with direct contact information for care management team  Patient Self Care Activities:  . Self administers medications as prescribed . Attends all scheduled provider appointments . Calls pharmacy for medication refills . Performs ADL's independently . Performs IADL's independently . Calls provider office for new concerns or questions  Initial goal documentation     . COMPLETED: "I have a [place] bit of a cough" (pt-stated)       Current Barriers:  [name] Knowledge Deficits related to diagnosis and treatment management related to having developed a cough . Risk for COVID-19 . Lack of transportation for COVID testing   Nurse Case Manager Clinical Goal(s):  [name] Over the next 14 days, patient will report her "cough" has resolved without developing further s/s suggestive of COVID-19  CCM RN CM Interventions:  07/05/19 completed call with patient   . Evaluation  of current treatment plan related to new onset of developing a cough and patient's adherence to plan as established by provider. Cough resolved   Patient Self Care Activities:  . Self administers medications as prescribed . Attends all scheduled provider appointments . Calls pharmacy for medication refills . Performs ADL's independently . Performs IADL's independently . Calls provider office for new concerns or questions  Initial goal documentation       Other   . Marland KitchenHigh Risk for Knowledge Deficit for CHF        Current Barriers:  [name] Knowledge Deficits related to disease process and Self Health management for CHF   Nurse Case Manager Clinical Goal(s):  [name] Over the next 60 days, patient will verbalize basic understanding of CHF disease process and self health management plan as evidenced by patient and caregiver will verbalize better understanding of the disease process for CHF and will verbalize increased understanding of MD recommendations for CHF disease management. 04/04/19 re-established goal date to 60 days due to treatment delays secondary to Corona virus Goal Met . 07/05/19 Over the next 60 days, patient will experience no IP or ED events secondary to exacerbation of CHF.  CCM RN CM Interventions:  07/05/19 completed call with patient   . Evaluation of current treatment plan related to CHF and patient's adherence to plan as established by provider. . Provided education to patient re: importance of performing daily weights each am after voiding and wearing the smallest amount of clothing; discussed recording weights daily; patient reports her weight on 07/04/19 was 184 lbs; she was unable to  weigh today due to being unable to bare weight on her right ankle - PT has evaluated and is concerned patient has a sprain; patient is elevating and wrapping with an Ace wrap  Reviewed and discussed the following medication changes directed by [facility]; Torsemide 20 MG Take 2 tablets by  mouth twice a day X's 3 days then take 2 tablets by mouth in the a.m. and 1 tablet by mouth in the p.m; Apixaban 2.5 mg Oral 2 times daily ;  .   (Discontinued by provider); Patient taking differently:  Take 1 mg by mouth daily. Take 1.5 tablets in the morning and 1 tablet in the evening.  Discussed plans with patient for ongoing care management follow up and provided patient with direct contact information for care management team . Discussed next scheduled OV with Cardiology is scheduled for 07/08/19 '@11' :40 AM . Discussed next scheduled OV with [name] is scheduled for 07/09/19 '@2'  PM  Patient Self Care Activities:  . Attends all scheduled provider appointments . Calls pharmacy for medication refills . Calls provider office for new concerns or questions  Please see past updates related to this goal by clicking on the "Past Updates" button in the selected goal          The patient verbalized understanding of instructions provided today and declined a print copy of patient instruction materials.   Telephone follow up appointment with care management team member scheduled for: 08/06/19  [name], RN, BSN, CCM Care Management Coordinator [place] [facility]  Direct Phone: [phone]

## 2019-07-07 NOTE — Progress Notes (Signed)
Cardiology Office Note   Date:  07/08/2019   ID:  [name], DOB 1933/06/22, MRN [id number]  PCP:  [name], MD    No chief complaint on file.  DOE/LE edema  Wt Readings from Last 3 Encounters:  07/08/19 182 lb (82.6 kg)  07/02/19 187 lb (84.8 kg)  06/11/19 187 lb 13.3 oz (85.2 kg)       History of Present Illness: [name] is a 85 y.o. female  who has had diastolic dysfunction in the past. She is on Coumadin for DVT. She has had atrial arrhythmias and chronic LE edema.   In addition to thehistory of h/o DVT (on Coumadin for this), chronic diastolic CHF, she also has deconditioning, obesity, HTN, SVT (correlated with pre-syncope, on amiodarone), DM, chronic anemia/thrombocytopenia, LBBB, probable CKD stage III (Cr 1.3 in 08/2014), mod MR by echo 2014 who presents for f/u. Per notes in 2013 she had an EPS with ablation for SVT which did not eliminate the SVT completely. She also had bradycardia which limited medication. She was placed on amiodarone by Dr. Lovena Le. Last echo 07/2013: mild LVH, moderate focal basal hypertrophy of septum, EF 55-60%, grade 1 DD, mod MR, mod LAE, PASP 40.  She sustained a mechanical fall in 07/2015 and was in the hospital for a week.  She was seen in [facility] 2017 for fluid overload. Lasix was increased and potassium was increased.   In 2018, there was a concern regarding stroke as she developed some stuttering and swallowing difficulties.  Her workup has been unrevealing at this time.  We checked carotid Doppler and echocardiogram.  Prior records show: "She followed with nephrology and cardiology in [place] and also has established care with [facility] as well. There are records in [place] but these only appear to be patient discharge instructions rather than provider notes.  She was recently admitted 06/08/2019 with shortness of breath and arm pain, found to have acute on chronic diastolic CHF requiring diuresis. Her  troponin was elevated to a peak of 310, felt due to demand ischemia in absence of true angina. Left arm pain was felt MSK, supported by plain films. No further ischemic workup was recommended. Age adjusted D-dimer was felt wnl. Per Dr. Lovena Le, device appeared to be working normally and did not need it interrogated during admission but routine EP f/u was recommended. 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 Per Dr. Francesca Oman note, "In the absence of fever or sign of sepsis I would not pursue further work-up at this point given the patient age and comorbidities.  I would consider TEE once any signs of infection." She recommended to restart oral Bumex after a 1 day holiday from diuresis. F/u Cr was still elevated on f/u labs 8/31 so lisinopril was discontinued. F/u BMET 06/28/19 showed K 4.5, Cr 1.87 (previous range variable from 1.4-2.3). Otherwise recent labs 05/2019 showed Hgb 8.9 (variable in 9-10 range this year), Plt 105 (variable in 2020), WBC 2.4, A1C 7.9, 07/2018 LDL 96.   She had a pacer placed in [place], and has f/u in our PM clinic scheduled.   She continues to struggle with significant lower extremity edema which she states is somewhat worse from recent hospitalization. She is taking 1.5 tablets of Bumex in AM and 1 tablet in PM. They are unsure how much total fluid she drinks,  as she gets it from different sources (ginger ale, water, coffee). She follows a low sodium diet. She snores and has been noted to gasp when she sleeps but she herself was unaware of this. She never smoked so she and her family are not sure where prior diagnoses of COPD came from on her paperwork from [place]. She has not had a prior sleep study. She denies any symptoms suggestive of angina."  Since the last visit, she has had better effect from the  torsemide.  ZShe has lost a few lbs and er breathing is better.  Her leg edema has persisted.    She was diagnosed with breast cancer.  She going back to [place] to have surgery.  She is on oral chemo.    Past Medical History:  Diagnosis Date   Abnormal echocardiogram    a. possible mass on echo 05/2019 on [facility] -  Per Dr. Francesca Oman note, " In the absence of fever or sign of sepsis I would not pursue further work-up at this point given the patient age and comorbidities.  I would consider TEE once any signs of infection."    Anemia    Arthritis    Cancer (HCC)    Cataract    Chronic combined systolic and diastolic CHF (congestive heart failure) ([place])    a. Previously diastolic but patient AVS from outside hospital listed systolic CHF and cardiomyopathy, records pending.   CKD (chronic kidney disease), stage III (HCC)    Clotting disorder (HCC)    CVA (cerebral vascular accident) ([place])    residual right sided weakness and mild dysphagia   Diabetes mellitus ([employer])    Dizziness and giddiness    DVT (deep venous thrombosis) ([place])    a. on anticoagulation for this.   Essential hypertension    LBBB (left bundle branch block)    Mitral regurgitation    a. Mod MR by echo 2014.   Muscular deconditioning    Obesity    Pacemaker    Pericardial effusion    SVT (supraventricular tachycardia) ([place])    a. In 2013 she had an EPS with ablation for SVT which did not eliminate the SVT completely. She also had bradycardia which limited medication. She was placed on amiodarone by Dr. Lovena Le.   Thrombocytopenia ([place]) 11/21/2011    Past Surgical History:  Procedure Laterality Date   EYE SURGERY     SUPRAVENTRICULAR TACHYCARDIA ABLATION N/A 10/11/2012   Procedure: SUPRAVENTRICULAR TACHYCARDIA ABLATION;  Surgeon: Evans Lance, MD;  Location[facility] C[facility] LAB;  Service: Cardiovascular;  Laterality: N/A;     Current Outpatient Medications  Medication Sig Dispense Refill    acetaminophen (TYLENOL) 500 MG tablet Take 1,000 mg by mouth every 6 (six) hours as needed for mild pain, moderate pain or headache.      amiodarone (PACERONE) 200 MG tablet Take 1 tablet (200 mg total) by mouth daily. 90 tablet 2   anastrozole (ARIMIDEX) 1 MG tablet Take 1 tablet (1 mg total) by mouth daily. 90 tablet 3   apixaban (ELIQUIS) 2.5 MG TABS tablet Take 1 tablet (2.5 mg total) by mouth 2 (two) times daily. 60 tablet 11   atorvastatin (LIPITOR) 20 MG tablet Take 1 tablet (20 mg total) by mouth at bedtime. 90 tablet 2   BD PEN NEEDLE NANO U/F 32G X 4 MM MISC Use as directed 150 each 2   carvedilol (COREG) 12.5 MG tablet Take 12.5 mg by mouth 2 (two) times daily with a meal.  diclofenac sodium (VOLTAREN) 1 % GEL Apply 2 g topically 4 (four) times daily. 100 g 1   doxycycline (VIBRAMYCIN) 100 MG capsule Take 1 capsule (100 mg total) by mouth 2 (two) times daily. 14 capsule 0   ferrous sulfate 325 (65 FE) MG tablet Take 325 mg by mouth daily with breakfast.     glucose blood (ACCU-CHEK GUIDE) test strip Use as instructed to check blood sugars 3 times per day prn dx: e11.22 400 each 3   HUMALOG KWIKPEN 200 UNIT/ML SOPN Inject 4-8 Units into the skin 3 (three) times daily. Per sliding scale 15 pen 1   isosorbide-hydrALAZINE (BIDIL) 20-37.5 MG tablet Take 1 tablet by mouth 3 (three) times daily.      LEVEMIR FLEXTOUCH 100 UNIT/ML Pen Inject 10-16 Units into the skin daily. 16 units in the am and 10 units in the evening (Patient taking differently: Inject 8-16 Units into the skin daily. 16 units in the am and 8 units in the evening) 15 mL 2   magnesium oxide (MAG-OX) 400 MG tablet Take 400 mg by mouth daily.     omeprazole (PRILOSEC) 40 MG capsule TAKE ONE CAPSULE BY MOUTH DAILY BEFORE A MEAL 90 capsule 1   oxybutynin (DITROPAN) 5 MG tablet Take 1 tablet (5 mg total) by mouth 2 (two) times daily. 180 tablet 1   potassium chloride SA (K-DUR) 20 MEQ tablet Take 2 tablets (40 mEq  total) by mouth daily. 180 tablet 1   pregabalin (LYRICA) 75 MG capsule Take 1 capsule (75 mg total) by mouth at bedtime. 90 capsule 2   torsemide (DEMADEX) 20 MG tablet Take 2 tablets by mouth twice a day X's 3 days then take 2 tablets by mouth in the a.m. and 1 tablet by mouth in the p.m 102 tablet 3   traMADol (ULTRAM) 50 MG tablet Take 50 mg by mouth 2 (two) times daily as needed for pain.     No current facility-administered medications for this visit.     Allergies:   Aspirin and Penicillins    Social History:  The patient  reports that she has never smoked. She has never used smokeless tobacco. She reports that she does not drink alcohol or use drugs.   Family History:  The patient's family history includes Breast cancer in her maternal aunt; Cancer in her father; Heart attack in her brother; Hypertension in her brother, daughter, and sister; Stroke in her mother and sister.    ROS:  Please see the history of present illness.   Otherwise, review of systems are positive for leg edema.   All other systems are reviewed and negative.    PHYSICAL EXAM: VS:  BP 134/60    Pulse 71    Ht 5' (1.524 m)    Wt 182 lb (82.6 kg)    SpO2 98%    BMI 35.54 kg/m  , BMI Body mass index is 35.54 kg/m. GEN: Well nourished, well developed, in no acute distress  HEENT: normal  Neck: no JVD, carotid bruits, or masses Cardiac: RRR; no murmurs, rubs, or gallops,bilateral LE edema  Respiratory:  clear to auscultation bilaterally, normal work of breathing GI: soft, nontender, nondistended, + BS MS: no deformity or atrophy  Skin: warm and dry, skin changes c/w LE edema noted Neuro:  Strength and sensation are intact Psych: euthymic mood, full affect   Recent Labs: 08/06/2018: NT-Pro BNP 1,604 06/08/2019: ALT 30 06/24/2019: BNP 159.8 07/02/2019: BUN 33; Creatinine, Ser 1.58; Hemoglobin 10.0; Magnesium  2.3; Platelets 111; Potassium 4.0; Sodium 141; TSH 2.140   Lipid Panel    Component Value  Date/Time   CHOL 195 08/15/2018 1236   TRIG 56 01/02/2019 1425   HDL 81 08/15/2018 1236   CHOLHDL 2.4 08/15/2018 1236   CHOLHDL 2.6 12/11/2008 0130   VLDL 13 12/11/2008 0130   LDLCALC 96 08/15/2018 1236     Other studies Reviewed: Additional studies/ records that were reviewed today with results demonstrating: Records from [facility]. reviewed.   ASSESSMENT AND PLAN:  1. Chronic diastolic heart failure: Appears euvolemic.  Stage 3 CKD. Stable.  Hbg improving.   2. Hypertensive heart disease: The current medical regimen is effective;  continue present plan and medications.   3. Ankle edema: Pain in right ankle which started acutely.  Will check right ankle xray.   4. Prior CVA: She has swallowing issues and right sided weakness.  She had speech therapy at the time.  5. Anticoagulated: No bleedig problems.  6. LE edema: Elevate legs.  Measured for compression stockings.  7. Pacer lead had something on it.  Will recheck echo.  Limited echo on 9/28 for the vegetation and pericardial effusion. No fevers or signs of infection so we have not pursued TEE.   Current medicines are reviewed at length with the patient today.  The patient concerns regarding her medicines were addressed.  The following changes have been made:  No change  Labs/ tests ordered today include:  No orders of the defined types were placed in this encounter.   Recommend 150 minutes/week of aerobic exercise Low fat, low carb, high fiber diet recommended  Disposition:   FU in 6 months   Signed, Larae Grooms, MD  07/08/2019 12:12 PM    [facility] [address], [place], [place]  [address] Phone: [phone]; Fax: [phone]    Addendum: she did have a fracture in her ankle and was referred to orthopedics.

## 2019-07-08 ENCOUNTER — Ambulatory Visit (INDEPENDENT_AMBULATORY_CARE_PROVIDER_SITE_OTHER): Payer: Medicare Other | Admitting: Interventional Cardiology

## 2019-07-08 ENCOUNTER — Ambulatory Visit
Admission: RE | Admit: 2019-07-08 | Discharge: 2019-07-08 | Disposition: A | Discharge status: Home / Self Care | Payer: Medicare Other | Source: Ambulatory Visit | Attending: Interventional Cardiology | Admitting: Interventional Cardiology

## 2019-07-08 ENCOUNTER — Other Ambulatory Visit: Payer: Self-pay

## 2019-07-08 ENCOUNTER — Encounter: Payer: Self-pay | Admitting: Interventional Cardiology

## 2019-07-08 VITALS — BP 134/60 | HR 71 | Ht 60.0 in | Wt 182.0 lb

## 2019-07-08 DIAGNOSIS — I69354 Hemiplegia and hemiparesis following cerebral infarction affecting left non-dominant side: Secondary | ICD-10-CM | POA: Diagnosis not present

## 2019-07-08 DIAGNOSIS — I313 Pericardial effusion (noninflammatory): Secondary | ICD-10-CM | POA: Diagnosis not present

## 2019-07-08 DIAGNOSIS — S82401A Unspecified fracture of shaft of right fibula, initial encounter for closed fracture: Secondary | ICD-10-CM | POA: Diagnosis not present

## 2019-07-08 DIAGNOSIS — I5032 Chronic diastolic (congestive) heart failure: Secondary | ICD-10-CM | POA: Diagnosis not present

## 2019-07-08 DIAGNOSIS — I3139 Other pericardial effusion (noninflammatory): Secondary | ICD-10-CM

## 2019-07-08 DIAGNOSIS — I110 Hypertensive heart disease with heart failure: Secondary | ICD-10-CM | POA: Diagnosis not present

## 2019-07-08 DIAGNOSIS — M25571 Pain in right ankle and joints of right foot: Secondary | ICD-10-CM

## 2019-07-08 DIAGNOSIS — I130 Hypertensive heart and chronic kidney disease with heart failure and stage 1 through stage 4 chronic kidney disease, or unspecified chronic kidney disease: Secondary | ICD-10-CM | POA: Diagnosis not present

## 2019-07-08 DIAGNOSIS — R1310 Dysphagia, unspecified: Secondary | ICD-10-CM | POA: Diagnosis not present

## 2019-07-08 DIAGNOSIS — I69351 Hemiplegia and hemiparesis following cerebral infarction affecting right dominant side: Secondary | ICD-10-CM | POA: Diagnosis not present

## 2019-07-08 DIAGNOSIS — I69391 Dysphagia following cerebral infarction: Secondary | ICD-10-CM | POA: Diagnosis not present

## 2019-07-08 LAB — CULTURE, BLOOD (SINGLE)

## 2019-07-08 MED ORDER — AMIODARONE HCL 200 MG PO TABS: 200.0000 mg | ORAL_TABLET | Freq: Every day | ORAL | 2 refills | Status: DC

## 2019-07-08 NOTE — Patient Instructions (Addendum)
Medication Instructions:  Your physician recommends that you continue on your current medications as directed. Please refer to the Current Medication list given to you today.  If you need a refill on your cardiac medications before your next appointment, please call your pharmacy.   Lab work: None Ordered   Testing/Procedures:  Right Ankle Xray  Echocardiogram 07/22/19 at [facility] has requested that you have an echocardiogram. Echocardiography is a painless test that uses sound waves to create images of your heart. It provides your doctor with information about the size and shape of your heart and how well your heart's chambers and valves are working. This procedure takes approximately one hour. There are no restrictions for this procedure.    Follow-Up: At [facility], you and your health needs are our priority.  As part of our continuing mission to provide you with exceptional heart care, we have created designated Provider Care Teams.  These Care Teams include your primary Cardiologist (physician) and Advanced Practice Providers (APPs -  Physician Assistants and Nurse Practitioners) who all work together to provide you with the care you need, when you need it. You will need a follow up appointment in 6 months.  Please call our office 2 months in advance to schedule this appointment.  You may see Larae Grooms, MD  or one of the following Advanced Practice Providers on your designated Care Team:   Ro[place], PA-C Melina Copa, PA-C . Ermalinda Barrios, PA-C  Any Other Special Instructions Will Be Listed Below (If Applicable).  Echocardiogram An echocardiogram is a procedure that uses painless sound waves (ultrasound) to produce an image of the heart. Images from an echocardiogram can provide important information about:  Signs of coronary artery disease (CAD).  Aneurysm detection. An aneurysm is a weak or damaged part of an artery wall that bulges out from the  normal force of blood pumping through the body.  Heart size and shape. Changes in the size or shape of the heart can be associated with certain conditions, including heart failure, aneurysm, and CAD.  Heart muscle function.  Heart valve function.  Signs of a past heart attack.  Fluid buildup around the heart.  Thickening of the heart muscle.  A tumor or infectious growth around the heart valves. Tell a health care provider about:  Any allergies you have.  All medicines you are taking, including vitamins, herbs, eye drops, creams, and over-the-counter medicines.  Any blood disorders you have.  Any surgeries you have had.  Any medical conditions you have.  Whether you are pregnant or may be pregnant. What are the risks? Generally, this is a safe procedure. However, problems may occur, including:  Allergic reaction to dye (contrast) that may be used during the procedure. What happens before the procedure? No specific preparation is needed. You may eat and drink normally. What happens during the procedure?   An IV tube may be inserted into one of your veins.  You may receive contrast through this tube. A contrast is an injection that improves the quality of the pictures from your heart.  A gel will be applied to your chest.  A wand-like tool (transducer) will be moved over your chest. The gel will help to transmit the sound waves from the transducer.  The sound waves will harmlessly bounce off of your heart to allow the heart images to be captured in real-time motion. The images will be recorded on a computer. The procedure may vary among health care providers  and hospitals. What happens after the procedure?  You may return to your normal, everyday life, including diet, activities, and medicines, unless your health care provider tells you not to do that. Summary  An echocardiogram is a procedure that uses painless sound waves (ultrasound) to produce an image of the  heart.  Images from an echocardiogram can provide important information about the size and shape of your heart, heart muscle function, heart valve function, and fluid buildup around your heart.  You do not need to do anything to prepare before this procedure. You may eat and drink normally.  After the echocardiogram is completed, you may return to your normal, everyday life, unless your health care provider tells you not to do that. This information is not intended to replace advice given to you by your health care provider. Make sure you discuss any questions you have with your health care provider. Document Released: 10/07/2000 Document Revised: 01/31/2019 Document Reviewed: 11/12/2016 Elsevier Patient Education  2020 [employer].

## 2019-07-09 ENCOUNTER — Telehealth: Payer: Self-pay | Admitting: *Deleted

## 2019-07-09 ENCOUNTER — Ambulatory Visit: Payer: Medicare Other | Admitting: Internal Medicine

## 2019-07-09 ENCOUNTER — Ambulatory Visit (INDEPENDENT_AMBULATORY_CARE_PROVIDER_SITE_OTHER): Payer: Medicare Other | Admitting: Orthopaedic Surgery

## 2019-07-09 ENCOUNTER — Encounter: Payer: Self-pay | Admitting: Orthopaedic Surgery

## 2019-07-09 DIAGNOSIS — S8261XA Displaced fracture of lateral malleolus of right fibula, initial encounter for closed fracture: Secondary | ICD-10-CM

## 2019-07-09 DIAGNOSIS — M25512 Pain in left shoulder: Secondary | ICD-10-CM

## 2019-07-09 DIAGNOSIS — G8929 Other chronic pain: Secondary | ICD-10-CM | POA: Diagnosis not present

## 2019-07-09 DIAGNOSIS — M25571 Pain in right ankle and joints of right foot: Secondary | ICD-10-CM | POA: Diagnosis not present

## 2019-07-09 NOTE — Telephone Encounter (Signed)
Staff message sent to [facility] per [facility] no PA is required. Ok to schedule sleep study.

## 2019-07-10 ENCOUNTER — Telehealth: Payer: Self-pay | Admitting: Orthopaedic Surgery

## 2019-07-10 DIAGNOSIS — I69354 Hemiplegia and hemiparesis following cerebral infarction affecting left non-dominant side: Secondary | ICD-10-CM | POA: Diagnosis not present

## 2019-07-10 DIAGNOSIS — I130 Hypertensive heart and chronic kidney disease with heart failure and stage 1 through stage 4 chronic kidney disease, or unspecified chronic kidney disease: Secondary | ICD-10-CM | POA: Diagnosis not present

## 2019-07-10 DIAGNOSIS — M25512 Pain in left shoulder: Secondary | ICD-10-CM | POA: Insufficient documentation

## 2019-07-10 DIAGNOSIS — S8261XA Displaced fracture of lateral malleolus of right fibula, initial encounter for closed fracture: Secondary | ICD-10-CM | POA: Insufficient documentation

## 2019-07-10 DIAGNOSIS — R1310 Dysphagia, unspecified: Secondary | ICD-10-CM | POA: Diagnosis not present

## 2019-07-10 DIAGNOSIS — I69351 Hemiplegia and hemiparesis following cerebral infarction affecting right dominant side: Secondary | ICD-10-CM | POA: Diagnosis not present

## 2019-07-10 DIAGNOSIS — I69391 Dysphagia following cerebral infarction: Secondary | ICD-10-CM | POA: Diagnosis not present

## 2019-07-10 NOTE — Progress Notes (Signed)
Office Visit Note   Patient: [name]           Date of Birth: 03/23/1933           MRN: [id number] Visit Date: 07/09/2019              Requested by: [name], [address] [place],  [place] [address] PCP: [name], MD   Assessment & Plan: Visit Diagnoses:  1. Displaced fracture of lateral malleolus of right fibula, initial encounter for closed fracture   2. Chronic left shoulder pain     Plan: Patient has CT likely supraspinatus tear with minimal retraction.  With her diabetes not really good candidate for subacromial cortisone injection.  We discussed using her hand in front of her avoiding overhead activities.  She has too much pitting edema to fit in a cam boot which we tried.  We placed her in a stirrup splint with improvement in her discomfort.  We will recheck her in 8 weeks.  She will use her walker with transfers and limit limited ambulation.  Her ankle mortise is in satisfactory position and should get satisfactory fibular healing.  Recheck 8 weeks repeat x-rays right ankle on return.  Follow-Up Instructions: Return in about 8 weeks (around 09/03/2019).   Orders:  No orders of the defined types were placed in this encounter.  No orders of the defined types were placed in this encounter.     Procedures: No procedures performed   Clinical Data: No additional findings.   Subjective: Chief Complaint  Patient presents with  . Left Shoulder - Pain  . Right Ankle - Pain    HPI 86 year old female here with 2 problems one is left shoulder and the other is right ankle pain.  She has congestive heart failure with bilateral lower extremity edema despite treatment with diuretics and has not been able to ambulate due to right ankle pain primarily lateral.  She is also had pain in her shoulder had a CT scan of her shoulder done 06/10/2019 which showed likely supraspinatus full-thickness tear with a slight retraction.  She has not had an MRI scan.   Ankle images 07/08/2019 showed transverse fracture of the fibula 7 to 8 cm proximal to the ankle joint without displacement.  She had osteopenia.  She has had pain with weightbearing and is here with her daughter.  She saw Dr. Irish Lack on 07/08/2023 images of her ankle were obtained.  She is used tramadol and Tylenol with slight relief.  Right ankle is more swollen than the left.  She denies fall or injury to her ankle that she can recall.  Review of Systems possible heart failure SVT, currently on Eliquis, diabetes on insulin, history of iliopsoas hematoma, congestive heart failure, chronic diastolic heart failure, history of pulmonary edema, pancreatitis, renal insufficiency chronic kidney disease stage III, history of DVT.  Positive history of right breast cancer.   Objective: Vital Signs: Ht 5\' 5"  (1.651 m)   Wt 182 lb (82.6 kg)   BMI 30.29 kg/m   Physical Exam Constitutional:      Appearance: Normal appearance.  HENT:     Head: Normocephalic.     Nose: Nose normal.  Eyes:     Extraocular Movements: Extraocular movements intact.     Pupils: Pupils are equal, round, and reactive to light.  Neck:     Musculoskeletal: Normal range of motion.  Cardiovascular:     Rate and Rhythm: Normal rate.  Pulmonary:  Effort: Pulmonary effort is normal.     Breath sounds: No wheezing.  Abdominal:     Comments: Obese.  Musculoskeletal:        General: Swelling and tenderness present.  Neurological:     Mental Status: She is alert.  Psychiatric:        Mood and Affect: Mood normal.        Behavior: Behavior normal.     Ortho Exam bilateral lower extremity pitting edema from the knee down worse in the right than left with tenderness over the fibula 7 8 cm proximal to the joint.  Normal capillary refill no plantar foot lesions.  Right shoulder should get arm up overhead left shoulder she can reach her hand to the top of her head but not higher.  Positive impingement mild to moderate left.   Long head the biceps is palpable.  Specialty Comments:  No specialty comments available.  Imaging: CLINICAL DATA:  Right ankle pain and swelling.  No known injury.  EXAM: RIGHT ANKLE - 2 VIEW  COMPARISON:  None.  FINDINGS: The patient has an acute fracture through the distal diaphysis of the fibula. There is minimal medial displacement of the distal fragment. No other acute bony abnormality is identified. Soft tissues are diffusely swollen, worse on the lateral side. Soft tissue calcifications about the ankle are consistent with prior infectious or inflammatory process.  IMPRESSION: Minimally displaced fracture of the distal diaphysis of the right fibula.  Diffuse soft tissue swelling.   Electronically Signed   By: Inge Rise M.D.   On: 07/08/2019 13:58  CLINICAL DATA:  Left shoulder pain for 6 days.  No known injury.  EXAM: CT OF THE UPPER LEFT EXTREMITY WITHOUT CONTRAST  TECHNIQUE: Multidetector CT imaging of the upper left extremity was performed according to the standard protocol.  COMPARISON:  None.  FINDINGS: Bones/Joint/Cartilage  There is no acute bony or joint abnormality. Mild glenohumeral osteoarthritis is present. The acromion is type 2. No lytic or sclerotic lesion is seen. Degenerative cystic change in the humeral head at the rotator cuff insertion is noted.  Ligaments  Suboptimally assessed by CT.  Muscles and Tendons  Musculature of the shoulder girdle is preserved without atrophy. Although visualization is limited on CT without intra-articular contrast, the anterior 1 cm of the supraspinatus is thinned and likely torn with retraction of 1-2 cm. No other evidence of rotator cuff tear is identified. The long head of biceps is not definitely seen in the bicipital groove worrisome for tear from the superior labrum.  Soft tissues  A moderate to moderately large volume of fluid is present in the  subacromial/subdeltoid bursa. Imaged lung parenchyma is clear.  IMPRESSION: Likely 1 cm from front to back tear of the anterior and far lateral supraspinatus with 1-2 cm of retraction. No atrophy. CT arthrogram is a more sensitive test for evaluation of rotator cuff tear.  Likely tear of the long head of biceps from the superior labrum.  Moderate to moderately large volume of subacromial/subdeltoid fluid consistent with bursitis.  Mild acromioclavicular osteoarthritis.   Electronically Signed   By: Inge Rise M.D.   On: 06/10/2019 15:28 PMFS History: Patient Active Problem List   Diagnosis Date Noted  . Displaced fracture of lateral malleolus of right fibula, initial encounter for closed fracture 07/10/2019  . Pain in left shoulder 07/10/2019  . Acute CHF (congestive heart failure) ([place]) 06/09/2019  . Acute pulmonary edema (HCC)   . Pressure injury of skin 01/04/2019  .  Acute pancreatitis 01/02/2019  . Malignant neoplasm of lower-inner quadrant of right breast of female, estrogen receptor positive ([place]) 11/23/2018  . Hematoma of right iliopsoas muscle 11/06/2018  . Intramuscular hematoma 11/06/2018  . Hypertensive heart disease with heart failure ([place]) 09/05/2017  . Aphasia 08/31/2017  . Dysphagia 08/31/2017  . CKD (chronic kidney disease), stage III ([place])   . LBBB (left bundle branch block)   . Anemia   . HTN (hypertension)   . Obesity   . DVT (deep venous thrombosis) ([place])   . DM (diabetes mellitus) ([place]) 07/28/2015  . Fall 07/28/2015  . Warfarin-induced coagulopathy ([place]) 07/28/2015  . Acute on chronic diastolic CHF (congestive heart failure) ([place]) 03/19/2014  . Edema 08/06/2013  . SVT (supraventricular tachycardia) ([place]) 10/15/2012  . First degree AV block 10/15/2012  . Acute renal insufficiency 10/14/2012  . Dizziness and giddiness   . Thrombocytopenia ([place]) 11/21/2011   Past Medical History:  Diagnosis Date  . Abnormal echocardiogram    a.  possible mass on echo 05/2019 on PPM -  Per Dr. Francesca Oman note, " In the absence of fever or sign of sepsis I would not pursue further work-up at this point given the patient age and comorbidities.  I would consider TEE once any signs of infection."   . Anemia   . Arthritis   . Cancer ([place])   . Cataract   . Chronic combined systolic and diastolic CHF (congestive heart failure) ([place])    a. Previously diastolic but patient AVS from outside hospital listed systolic CHF and cardiomyopathy, records pending.  . CKD (chronic kidney disease), stage III ([place])   . Clotting disorder ([place])   . CVA (cerebral vascular accident) ([place])    residual right sided weakness and mild dysphagia  . Diabetes mellitus ([place])   . Dizziness and giddiness   . DVT (deep venous thrombosis) (HCC)    a. on anticoagulation for this.  . Essential hypertension   . LBBB (left bundle branch block)   . Mitral regurgitation    a. Mod MR by echo 2014.  . Muscular deconditioning   . Obesity   . Pacemaker   . Pericardial effusion   . SVT (supraventricular tachycardia) ([place])    a. In 2013 she had an EPS with ablation for SVT which did not eliminate the SVT completely. She also had bradycardia which limited medication. She was placed on amiodarone by Dr. Lovena Le.  . Thrombocytopenia ([place]) 11/21/2011    Family History  Problem Relation Age of Onset  . Stroke Mother   . Cancer Father        prostate  . Hypertension Daughter   . Heart attack Brother        x2  . Hypertension Brother   . Stroke Sister   . Hypertension Sister   . Breast cancer Maternal Aunt     Past Surgical History:  Procedure Laterality Date  . EYE SURGERY    . SUPRAVENTRICULAR TACHYCARDIA ABLATION N/A 10/11/2012   Procedure: SUPRAVENTRICULAR TACHYCARDIA ABLATION;  Surgeon: Evans Lance, MD;  Location: [facility] CATH LAB;  Service: Cardiovascular;  Laterality: N/A;   Social History   Occupational History  . Occupation: retired  Tobacco Use  . Smoking  status: Never Smoker  . Smokeless tobacco: Never Used  Substance and Sexual Activity  . Alcohol use: No  . Drug use: No  . Sexual activity: Not Currently

## 2019-07-10 NOTE — Telephone Encounter (Signed)
Would d/c pt for now. Can restart in 4 wks

## 2019-07-10 NOTE — Telephone Encounter (Signed)
[name] with [name] called. Need clarification on patient's PT orders. Does she need the aircast? Is she WBAT? His call back number is [phone].

## 2019-07-10 NOTE — Telephone Encounter (Signed)
Would you like for patient to continue PT or discontinue due to limited weight bearing?

## 2019-07-11 ENCOUNTER — Telehealth: Payer: Self-pay

## 2019-07-11 ENCOUNTER — Telehealth: Payer: Self-pay | Admitting: *Deleted

## 2019-07-11 ENCOUNTER — Ambulatory Visit: Payer: Self-pay

## 2019-07-11 DIAGNOSIS — I110 Hypertensive heart disease with heart failure: Secondary | ICD-10-CM | POA: Diagnosis not present

## 2019-07-11 DIAGNOSIS — E1122 Type 2 diabetes mellitus with diabetic chronic kidney disease: Secondary | ICD-10-CM

## 2019-07-11 DIAGNOSIS — R5381 Other malaise: Secondary | ICD-10-CM

## 2019-07-11 DIAGNOSIS — I130 Hypertensive heart and chronic kidney disease with heart failure and stage 1 through stage 4 chronic kidney disease, or unspecified chronic kidney disease: Secondary | ICD-10-CM

## 2019-07-11 NOTE — Telephone Encounter (Signed)
I called [name] and advised. He states that they were seeing patient for PT for other things as well. I advised per Dr. Lorin Mercy she should limit ambulation at this point.  He expressed understanding.

## 2019-07-11 NOTE — Telephone Encounter (Signed)
Patient is scheduled for lab study on 9/27. [name] is scheduled for COVID screening on 9/24 2:55 prior to [facility] Patient understands her sleep study will be done at [facility] sleep lab. Patient understands she will receive a sleep packet in a week or so. Patient understands to call if she does not receive the sleep packet in a timely manner. Patient agrees with treatment and thanked me for call.

## 2019-07-11 NOTE — Chronic Care Management (AMB) (Signed)
  Chronic Care Management   Social Work Note  07/11/2019 Name: [name] MRN: [id number] DOB: 1933/09/13  Unsuccessful outbound call to confirm receipt of transportation resource. HIPAA compliant voice message left requesting a return call.   Follow Up Plan: SW will follow up with patient by phone over the next 10 days.  [name], BSW, CDP Social Worker, Certified Dementia Practitioner [place] [facility] [place] [facility] [phone]

## 2019-07-11 NOTE — Telephone Encounter (Signed)
-----   Message from Lauralee Evener, Melba sent at 07/09/2019 12:41 PM EDT ----- Regarding: RE: Sleep Study Ok to schedule sleep study. Per [facility] web portal no PA is required. Decision [id number] ----- Message ----- From: Jeanann Lewandowsky, RMA Sent: 07/02/2019  10:04 AM EDT To: Cv Div Sleep Studies Subject: Sleep Study                                    Pt needs a sleep study Thanks!

## 2019-07-12 NOTE — Telephone Encounter (Signed)
Sleep study on hold until patient calls back after her covid test.

## 2019-07-14 NOTE — Progress Notes (Addendum)
Virtual Visit via Phone   This visit type was conducted due to national recommendations for restrictions regarding the COVID-19 Pandemic (e.g. social distancing) in an effort to limit this patient's exposure and mitigate transmission in our community.  Due to her co-morbid illnesses, this patient is at least at moderate risk for complications without adequate follow up.  This format is felt to be most appropriate for this patient at this time.  All issues noted in this document were discussed and addressed.  A limited physical exam was performed with this format.    This visit type was conducted due to national recommendations for restrictions regarding the COVID-19 Pandemic (e.g. social distancing) in an effort to limit this patient's exposure and mitigate transmission in our community.  Patients identity confirmed using two different identifiers.  This format is felt to be most appropriate for this patient at this time.  All issues noted in this document were discussed and addressed.  No physical exam was performed (except for noted visual exam findings with Video Visits).    Date:  07/14/2019   ID:  [name], DOB 23-Mar-1933, MRN [id number]  Patient Location:  Home  Provider location:   Office    Chief Complaint:  "I have a cough"  History of Present Illness:    [name] is a 85 y.o. female who presents via video conferencing for a telehealth visit today.    The patient does have symptoms concerning for COVID-19 infection (fever, chills, cough, or new shortness of breath).   She presents today for virtual visit. She prefers this method of contact due to COVID-19 pandemic.  She presents today for evaluation of a cough. Cough is productive of discolored sputum. She denies coughing up blood. She has not noticed a fever/chills. She reports her daughter's boyfriend had COVID recently. She lives in the same home.  Cough This is a new problem. The current episode started in the  past 7 days. The problem has been gradually worsening. The cough is productive of sputum. The symptoms are aggravated by lying down. She has tried nothing for the symptoms.     Past Medical History:  Diagnosis Date  . Abnormal echocardiogram    a. possible mass on echo 05/2019 on PPM -  Per Dr. Francesca Oman note, " In the absence of fever or sign of sepsis I would not pursue further work-up at this point given the patient age and comorbidities.  I would consider TEE once any signs of infection."   . Anemia   . Arthritis   . Cancer ([place])   . Cataract   . Chronic combined systolic and diastolic CHF (congestive heart failure) ([place])    a. Previously diastolic but patient AVS from outside hospital listed systolic CHF and cardiomyopathy, records pending.  . CKD (chronic kidney disease), stage III ([place])   . Clotting disorder ([place])   . CVA (cerebral vascular accident) ([place])    residual right sided weakness and mild dysphagia  . Diabetes mellitus ([place])   . Dizziness and giddiness   . DVT (deep venous thrombosis) (HCC)    a. on anticoagulation for this.  . Essential hypertension   . LBBB (left bundle branch block)   . Mitral regurgitation    a. Mod MR by echo 2014.  . Muscular deconditioning   . Obesity   . Pacemaker   . Pericardial effusion   . SVT (supraventricular tachycardia) ([place])    a. In 2013 she  had an EPS with ablation for SVT which did not eliminate the SVT completely. She also had bradycardia which limited medication. She was placed on amiodarone by Dr. Lovena Le.  . Thrombocytopenia ([place]) 11/21/2011   Past Surgical History:  Procedure Laterality Date  . EYE SURGERY    . SUPRAVENTRICULAR TACHYCARDIA ABLATION N/A 10/11/2012   Procedure: SUPRAVENTRICULAR TACHYCARDIA ABLATION;  Surgeon: Evans Lance, MD;  Location: [facility] CATH LAB;  Service: Cardiovascular;  Laterality: N/A;     Current Meds  Medication Sig  . acetaminophen (TYLENOL) 500 MG tablet Take 1,000 mg by mouth every 6 (six)  hours as needed for mild pain, moderate pain or headache.   . anastrozole (ARIMIDEX) 1 MG tablet Take 1 tablet (1 mg total) by mouth daily.  [name] atorvastatin (LIPITOR) 20 MG tablet Take 1 tablet (20 mg total) by mouth at bedtime.  . BD PEN NEEDLE NANO U/F 32G X 4 MM MISC Use as directed  . diclofenac sodium (VOLTAREN) 1 % GEL Apply 2 g topically 4 (four) times daily.  . ferrous sulfate 325 (65 FE) MG tablet Take 325 mg by mouth daily with breakfast.  . 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 HUMALOG KWIKPEN 200 UNIT/ML SOPN Inject 4-8 Units into the skin 3 (three) times daily. Per sliding scale  . isosorbide-hydrALAZINE (BIDIL) 20-37.5 MG tablet Take 1 tablet by mouth 3 (three) times daily.   [name] LEVEMIR FLEXTOUCH 100 UNIT/ML Pen Inject 10-16 Units into the skin daily. 16 units in the am and 10 units in the evening (Patient taking differently: Inject 8-16 Units into the skin daily. 16 units in the am and 8 units in the evening)  . magnesium oxide (MAG-OX) 400 MG tablet Take 400 mg by mouth daily.  [name] omeprazole (PRILOSEC) 40 MG capsule TAKE ONE CAPSULE BY MOUTH DAILY BEFORE A MEAL  . oxybutynin (DITROPAN) 5 MG tablet Take 1 tablet (5 mg total) by mouth 2 (two) times daily.  . potassium chloride SA (K-DUR) 20 MEQ tablet Take 2 tablets (40 mEq total) by mouth daily.  . pregabalin (LYRICA) 75 MG capsule Take 1 capsule (75 mg total) by mouth at bedtime.  . traMADol (ULTRAM) 50 MG tablet Take 50 mg by mouth 2 (two) times daily as needed for pain.  . [DISCONTINUED] amiodarone (PACERONE) 200 MG tablet Take 1 tablet (200 mg total) by mouth daily.  . [DISCONTINUED] apixaban (ELIQUIS) 5 MG TABS tablet Take 1 tablet (5 mg total) by mouth 2 (two) times daily.  . [DISCONTINUED] bumetanide (BUMEX) 1 MG tablet Take 1 tablet (1 mg total) by mouth daily. (Patient taking differently: Take 1 mg by mouth daily. Take 1.5 tablets in the morning and  1 tablet in the evening.)  . [DISCONTINUED] carvedilol (COREG) 25 MG tablet Take 1 tablet (25 mg total) by mouth 2 (two) times daily. (Patient not taking: Reported on 07/02/2019)  . [DISCONTINUED] lisinopril (ZESTRIL) 2.5 MG tablet Take 1 tablet (2.5 mg total) by mouth daily.     Allergies:   Aspirin and Penicillins   Social History   Tobacco Use  . Smoking status: Never Smoker  . Smokeless tobacco: Never Used  Substance Use Topics  . Alcohol use: No  . Drug use: No     Family Hx: The patient's family history includes Breast cancer in her maternal aunt; Cancer in her father; Heart attack in her brother; Hypertension in her brother, daughter,  and sister; Stroke in her mother and sister.  ROS:   Please see the history of present illness.    Review of Systems  Constitutional: Positive for malaise/fatigue.  Respiratory: Positive for cough.   Cardiovascular: Negative.   Gastrointestinal: Negative.   Neurological: Negative.   Psychiatric/Behavioral: Negative.     All other systems reviewed and are negative.   Labs/Other Tests and Data Reviewed:    Recent Labs: 08/06/2018: NT-Pro BNP 1,604 06/08/2019: ALT 30 06/24/2019: BNP 159.8 07/02/2019: BUN 33; Creatinine, Ser 1.58; Hemoglobin 10.0; Magnesium 2.3; Platelets 111; Potassium 4.0; Sodium 141; TSH 2.140   Recent Lipid Panel Lab Results  Component Value Date/Time   CHOL 195 08/15/2018 12:36 PM   TRIG 56 01/02/2019 02:25 PM   HDL 81 08/15/2018 12:36 PM   CHOLHDL 2.4 08/15/2018 12:36 PM   CHOLHDL 2.6 12/11/2008 01:30 AM   LDLCALC 96 08/15/2018 12:36 PM    Wt Readings from Last 3 Encounters:  07/09/19 182 lb (82.6 kg)  07/08/19 182 lb (82.6 kg)  07/02/19 187 lb (84.8 kg)     Exam:    Vital Signs:  Ht 5' (1.524 m)   BMI 36.68 kg/m     Physical Exam  PE not performed, this is a phone visit.   ASSESSMENT & PLAN:     1. Upper respiratory tract infection, unspecified type  She agrees to COVID testing. I will also  send rx doxycycline to the pharmacy. She is encouraged to take full course of abx.   She is encouraged to contact me in 48 hours to let me know how she is doing.  She is encouraged to go to ER should her sx persist.   - Novel Coronavirus, NAA ([facility])  2. Debility  She is encouraged to stay well hydrated.     COVID-19 Education: The signs and symptoms of COVID-19 were discussed with the patient and how to seek care for testing (follow up with PCP or arrange E-visit).  The importance of social distancing was discussed today.  Patient Risk:   After full review of this patients clinical status, I feel that they are at least moderate risk at this time.  Time:   Today, I have spent 25 minutes/ seconds with the patient with telehealth technology discussing above diagnoses.     Medication Adjustments/Labs and Tests Ordered: Current medicines are reviewed at length with the patient today.  Concerns regarding medicines are outlined above.   Tests Ordered: Orders Placed This Encounter  Procedures  . Novel Coronavirus, NAA ([facility])    Medication Changes: Meds ordered this encounter  Medications  . doxycycline (VIBRAMYCIN) 100 MG capsule    Sig: Take 1 capsule (100 mg total) by mouth 2 (two) times daily.    Dispense:  14 capsule    Refill:  0    Disposition:  Follow up prn  Signed, Maximino [place], MD

## 2019-07-15 DIAGNOSIS — R1310 Dysphagia, unspecified: Secondary | ICD-10-CM | POA: Diagnosis not present

## 2019-07-15 DIAGNOSIS — I69391 Dysphagia following cerebral infarction: Secondary | ICD-10-CM | POA: Diagnosis not present

## 2019-07-15 DIAGNOSIS — I130 Hypertensive heart and chronic kidney disease with heart failure and stage 1 through stage 4 chronic kidney disease, or unspecified chronic kidney disease: Secondary | ICD-10-CM | POA: Diagnosis not present

## 2019-07-15 DIAGNOSIS — I69354 Hemiplegia and hemiparesis following cerebral infarction affecting left non-dominant side: Secondary | ICD-10-CM | POA: Diagnosis not present

## 2019-07-15 DIAGNOSIS — I69351 Hemiplegia and hemiparesis following cerebral infarction affecting right dominant side: Secondary | ICD-10-CM | POA: Diagnosis not present

## 2019-07-16 ENCOUNTER — Telehealth: Payer: Self-pay

## 2019-07-16 ENCOUNTER — Ambulatory Visit: Payer: Self-pay

## 2019-07-16 DIAGNOSIS — I5032 Chronic diastolic (congestive) heart failure: Secondary | ICD-10-CM

## 2019-07-16 DIAGNOSIS — E1122 Type 2 diabetes mellitus with diabetic chronic kidney disease: Secondary | ICD-10-CM

## 2019-07-16 DIAGNOSIS — Z794 Long term (current) use of insulin: Secondary | ICD-10-CM

## 2019-07-16 DIAGNOSIS — R5381 Other malaise: Secondary | ICD-10-CM

## 2019-07-16 NOTE — Chronic Care Management (AMB) (Signed)
  Chronic Care Management   Social Work Note  07/16/2019 Name: [name] MRN: [id number] DOB: [birth year]  CCM SW placed an outbound call to the patients home number listed in demographics. Spoke with the patients daughter who reported this contact number was her direct number. Patients daughter requested SW contact the patient on her cell number. CCM SW placed an unsuccessful call to the patients cell number. HIPAA compliant voice message left requesting a return call.   Follow Up Plan: SW will place a third and final call attempt to the patient over the next 10 days if no return call is received.  [name], BSW, CDP Social Worker, Certified Dementia Practitioner [place] [facility] [place] [facility] [phone]

## 2019-07-17 ENCOUNTER — Telehealth: Payer: Self-pay | Admitting: Interventional Cardiology

## 2019-07-17 ENCOUNTER — Telehealth: Payer: Self-pay

## 2019-07-17 DIAGNOSIS — R1310 Dysphagia, unspecified: Secondary | ICD-10-CM | POA: Diagnosis not present

## 2019-07-17 DIAGNOSIS — I69391 Dysphagia following cerebral infarction: Secondary | ICD-10-CM | POA: Diagnosis not present

## 2019-07-17 DIAGNOSIS — I69354 Hemiplegia and hemiparesis following cerebral infarction affecting left non-dominant side: Secondary | ICD-10-CM | POA: Diagnosis not present

## 2019-07-17 DIAGNOSIS — L89151 Pressure ulcer of sacral region, stage 1: Secondary | ICD-10-CM | POA: Diagnosis not present

## 2019-07-17 DIAGNOSIS — I130 Hypertensive heart and chronic kidney disease with heart failure and stage 1 through stage 4 chronic kidney disease, or unspecified chronic kidney disease: Secondary | ICD-10-CM | POA: Diagnosis not present

## 2019-07-17 DIAGNOSIS — I69351 Hemiplegia and hemiparesis following cerebral infarction affecting right dominant side: Secondary | ICD-10-CM | POA: Diagnosis not present

## 2019-07-17 NOTE — Telephone Encounter (Signed)
New Message  Patient's daughter [name]) is returning a missed call, no voicemail was left. No notes in the chart for me to be able to see who called and for what reason. Please give patient/patient's daughter a call back.

## 2019-07-17 NOTE — Telephone Encounter (Signed)
Returned call: Patients daughter states [name] missed getting her covid test and she will call our office back once she gets her covid test rescheduled.

## 2019-07-18 ENCOUNTER — Other Ambulatory Visit (HOSPITAL_COMMUNITY): Payer: Medicare Other

## 2019-07-21 ENCOUNTER — Encounter (HOSPITAL_BASED_OUTPATIENT_CLINIC_OR_DEPARTMENT_OTHER): Payer: Medicare Other | Admitting: Cardiology

## 2019-07-22 ENCOUNTER — Ambulatory Visit (INDEPENDENT_AMBULATORY_CARE_PROVIDER_SITE_OTHER): Payer: Medicare Other | Admitting: Internal Medicine

## 2019-07-22 ENCOUNTER — Encounter: Payer: Self-pay | Admitting: Internal Medicine

## 2019-07-22 ENCOUNTER — Other Ambulatory Visit (HOSPITAL_COMMUNITY)
Admission: RE | Admit: 2019-07-22 | Discharge: 2019-07-22 | Disposition: A | Discharge status: Home / Self Care | Payer: Medicare Other | Source: Ambulatory Visit | Attending: Physician Assistant | Admitting: Physician Assistant

## 2019-07-22 ENCOUNTER — Other Ambulatory Visit: Payer: Self-pay

## 2019-07-22 ENCOUNTER — Ambulatory Visit (HOSPITAL_BASED_OUTPATIENT_CLINIC_OR_DEPARTMENT_OTHER): Payer: Medicare Other

## 2019-07-22 DIAGNOSIS — I110 Hypertensive heart disease with heart failure: Secondary | ICD-10-CM

## 2019-07-22 DIAGNOSIS — I5032 Chronic diastolic (congestive) heart failure: Secondary | ICD-10-CM

## 2019-07-22 DIAGNOSIS — R55 Syncope and collapse: Secondary | ICD-10-CM

## 2019-07-22 DIAGNOSIS — Z1159 Encounter for screening for other viral diseases: Secondary | ICD-10-CM | POA: Diagnosis not present

## 2019-07-22 DIAGNOSIS — I3139 Other pericardial effusion (noninflammatory): Secondary | ICD-10-CM

## 2019-07-22 DIAGNOSIS — I313 Pericardial effusion (noninflammatory): Secondary | ICD-10-CM

## 2019-07-22 DIAGNOSIS — Z20828 Contact with and (suspected) exposure to other viral communicable diseases: Secondary | ICD-10-CM | POA: Insufficient documentation

## 2019-07-22 LAB — CUP PACEART INCLINIC DEVICE CHECK
Battery Remaining Longevity: 106 mo
Brady Statistic RA Percent Paced: 8 %
Brady Statistic RV Percent Paced: 100 %
Date Time Interrogation Session: 20200928111900
Implantable Lead Implant Date: 20200624
Implantable Lead Implant Date: 20200624
Implantable Lead Location: 753859
Implantable Lead Location: 753860
Implantable Lead Model: 377
Implantable Lead Model: 377
Implantable Lead Serial Number: 81195510
Implantable Pulse Generator Implant Date: 20200624
Lead Channel Impedance Value: 448 Ohm
Lead Channel Impedance Value: 604 Ohm
Lead Channel Pacing Threshold Amplitude: 0.7 V
Lead Channel Pacing Threshold Amplitude: 0.9 V
Lead Channel Pacing Threshold Pulse Width: 0.4 ms
Lead Channel Pacing Threshold Pulse Width: 0.4 ms
Lead Channel Sensing Intrinsic Amplitude: 1.9 mV
Lead Channel Sensing Intrinsic Amplitude: 20.3 mV
Lead Channel Setting Pacing Amplitude: 2.4 V
Lead Channel Setting Pacing Amplitude: 2.4 V
Lead Channel Setting Pacing Pulse Width: 0.4 ms
Pulse Gen Model: 407145
Pulse Gen Serial Number: 69638782

## 2019-07-22 LAB — ECHOCARDIOGRAM LIMITED
Height: 65 in
Weight: 2924.8 oz

## 2019-07-22 NOTE — Progress Notes (Signed)
HPI [name] is referred today by Dr. Irish Lack for ongoing PPM evaluation. I had seen her years ago with recurrent SVT and LBBB and she underwent successful catheter ablation of a concealed mid-septal AP causing AVRT and following ablation had no inducible SVT but LBBB (old) and first degree AV block which was new. She was noted to have symptomatic daytime pauses and underwent PPM insertion in [place]. She decided to move back to [place].  Allergies  Allergen Reactions   Aspirin Itching   Penicillins Itching and Rash    DID THE REACTION INVOLVE: Swelling of the face/tongue/throat, SOB, or low BP? y Sudden or severe rash/hives, skin peeling, or the inside of the mouth or nose? n Did it require medical treatment? yes When did it last happen?more than 10 years If all above answers are "NO", may proceed with cephalosporin use.      Current Outpatient Medications  Medication Sig Dispense Refill   acetaminophen (TYLENOL) 500 MG tablet Take 1,000 mg by mouth every 6 (six) hours as needed for mild pain, moderate pain or headache.      amiodarone (PACERONE) 200 MG tablet Take 1 tablet (200 mg total) by mouth daily. 90 tablet 2   anastrozole (ARIMIDEX) 1 MG tablet Take 1 tablet (1 mg total) by mouth daily. 90 tablet 3   apixaban (ELIQUIS) 2.5 MG TABS tablet Take 1 tablet (2.5 mg total) by mouth 2 (two) times daily. 60 tablet 11   atorvastatin (LIPITOR) 20 MG tablet Take 1 tablet (20 mg total) by mouth at bedtime. 90 tablet 2   BD PEN NEEDLE NANO U/F 32G X 4 MM MISC Use as directed 150 each 2   calcium carbonate (OS-CAL) 600 MG TABS tablet Take by mouth.     carvedilol (COREG) 12.5 MG tablet Take 12.5 mg by mouth 2 (two) times daily with a meal.     diclofenac sodium (VOLTAREN) 1 % GEL Apply 2 g topically 4 (four) times daily. 100 g 1   doxycycline (VIBRAMYCIN) 100 MG capsule Take 1 capsule (100 mg total) by mouth 2 (two) times daily. 14 capsule 0   ferrous sulfate  325 (65 FE) MG tablet Take 325 mg by mouth daily with breakfast.     glucose blood (ACCU-CHEK GUIDE) test strip Use as instructed to check blood sugars 3 times per day prn dx: e11.22 400 each 3   HUMALOG KWIKPEN 200 UNIT/ML SOPN Inject 4-8 Units into the skin 3 (three) times daily. Per sliding scale 15 pen 1   isosorbide-hydrALAZINE (BIDIL) 20-37.5 MG tablet Take 1 tablet by mouth 3 (three) times daily.      LEVEMIR FLEXTOUCH 100 UNIT/ML Pen Inject 10-16 Units into the skin daily. 16 units in the am and 10 units in the evening (Patient taking differently: Inject 8-16 Units into the skin daily. 16 units in the am and 8 units in the evening) 15 mL 2   magnesium oxide (MAG-OX) 400 MG tablet Take 400 mg by mouth daily.     omeprazole (PRILOSEC) 40 MG capsule TAKE ONE CAPSULE BY MOUTH DAILY BEFORE A MEAL 90 capsule 1   oxybutynin (DITROPAN) 5 MG tablet Take 1 tablet (5 mg total) by mouth 2 (two) times daily. 180 tablet 1   potassium chloride SA (K-DUR) 20 MEQ tablet Take 2 tablets (40 mEq total) by mouth daily. 180 tablet 1   pregabalin (LYRICA) 75 MG capsule Take 1 capsule (75 mg total) by mouth at bedtime. 90 capsule  2   torsemide (DEMADEX) 20 MG tablet Take 2 tablets by mouth twice a day X's 3 days then take 2 tablets by mouth in the a.m. and 1 tablet by mouth in the p.m 102 tablet 3   traMADol (ULTRAM) 50 MG tablet Take 50 mg by mouth 2 (two) times daily as needed for pain.     No current facility-administered medications for this visit.      Past Medical History:  Diagnosis Date   Abnormal echocardiogram    a. possible mass on echo 05/2019 on PPM -  Per Dr. Francesca Oman note, " In the absence of fever or sign of sepsis I would not pursue further work-up at this point given the patient age and comorbidities.  I would consider TEE once any signs of infection."    Anemia    Arthritis    Cancer (HCC)    Cataract    Chronic combined systolic and diastolic CHF (congestive heart  failure) ([place])    a. Previously diastolic but patient AVS from outside hospital listed systolic CHF and cardiomyopathy, records pending.   CKD (chronic kidney disease), stage III (HCC)    Clotting disorder (HCC)    CVA (cerebral vascular accident) ([place])    residual right sided weakness and mild dysphagia   Diabetes mellitus ([place])    Dizziness and giddiness    DVT (deep venous thrombosis) ([place])    a. on anticoagulation for this.   Essential hypertension    LBBB (left bundle branch block)    Mitral regurgitation    a. Mod MR by echo 2014.   Muscular deconditioning    Obesity    Pacemaker    Pericardial effusion    SVT (supraventricular tachycardia) ([place])    a. In 2013 she had an EPS with ablation for SVT which did not eliminate the SVT completely. She also had bradycardia which limited medication. She was placed on amiodarone by Dr. Lovena Le.   Thrombocytopenia ([place]) 11/21/2011    ROS:   All systems reviewed and negative except as noted in the HPI.   Past Surgical History:  Procedure Laterality Date   EYE SURGERY     SUPRAVENTRICULAR TACHYCARDIA ABLATION N/A 10/11/2012   Procedure: SUPRAVENTRICULAR TACHYCARDIA ABLATION;  Surgeon: Evans Lance, MD;  Location: [facility] CATH LAB;  Service: Cardiovascular;  Laterality: N/A;     Family History  Problem Relation Age of Onset   Stroke Mother    Cancer Father        prostate   Hypertension Daughter    Heart attack Brother        x2   Hypertension Brother    Stroke Sister    Hypertension Sister    Breast cancer Maternal Aunt      Social History   Socioeconomic History   Marital status: Widowed    Spouse name: Not on file   Number of children: Not on file   Years of education: Not on file   Highest education level: Not on file  Occupational History   Occupation: retired  Scientist, product/process development strain: Not hard at [employer]    Worry: Never true    Inability: Never  true   Transportation needs    Medical: No    Non-medical: No  Tobacco Use   Smoking status: Never Smoker   Smokeless tobacco: Never Used  Substance and Sexual Activity   Alcohol use: No   Drug use: No   Sexual activity:  Not Currently  Lifestyle   Physical activity    Days per week: 0 days    Minutes per session: 0 min   Stress: Not at all  Relationships   Social connections    Talks on phone: Not on file    Gets together: Not on file    Attends religious service: Not on file    Active member of club or organization: Not on file    Attends meetings of clubs or organizations: Not on file    Relationship status: Not on file   Intimate partner violence    Fear of current or ex partner: No    Emotionally abused: No    Physically abused: No    Forced sexual activity: No  Other Topics Concern   Not on file  Social History Narrative   Lives at home alone   She has a nursing aid who comes to her home for 2.5 hrs/day x 4 days/week    Right handed   1 cup caffeine daily     BP 122/64    Pulse 70    Ht 5\' 5"  (1.651 m)    Wt 182 lb 12.8 oz (82.9 kg)    SpO2 97%    BMI 30.42 kg/m  BP - 122/62 Physical Exam:  Well appearing NAD HEENT: Unremarkable Neck:  No JVD, no thyromegally Lymphatics:  No adenopathy Back:  No CVA tenderness Lungs:  Clear with no wheezes HEART:  Regular rate rhythm, no murmurs, no rubs, no clicks Abd:  soft, positive bowel sounds, no organomegally, no rebound, no guarding Ext:  2 plus pulses, no edema, no cyanosis, no clubbing Skin:  No rashes no nodules Neuro:  CN II through XII intact, motor grossly intact  EKG - nsr with LBBB  DEVICE  Normal device function.  See PaceArt for details.   Assess/Plan: 1. Transient AV block with syncope - she is asymptomatic, s/p PPM insertion.. 2. PPM- her Biotronik DDD PM is working normally.  3. HTN - her SBP is well controlled. No change in her meds.  Mikle Bosworth.D.

## 2019-07-22 NOTE — Patient Instructions (Addendum)
Medication Instructions:  Your physician recommends that you continue on your current medications as directed. Please refer to the Current Medication list given to you today.  Labwork: None ordered.  Testing/Procedures: None ordered.  Follow-Up: Your physician wants you to follow-up in: 9 months with Dr. Lovena Le.   You will receive a reminder letter in the mail two months in advance. If you don't receive a letter, please call our office to schedule the follow-up appointment.  Remote monitoring is used to monitor your Pacemaker from home. This monitoring reduces the number of office visits required to check your device to one time per year. It allows [place] to keep an eye on the functioning of your device to ensure it is working properly. You are scheduled for a device check from home on 10/21/2019. You may send your transmission at any time that day. If you have a wireless device, the transmission will be sent automatically. After your physician reviews your transmission, you will receive a postcard with your next transmission date.  Any Other Special Instructions Will Be Listed Below (If Applicable).  If you need a refill on your cardiac medications before your next appointment, please call your pharmacy.

## 2019-07-23 ENCOUNTER — Telehealth: Payer: Self-pay | Admitting: *Deleted

## 2019-07-23 ENCOUNTER — Ambulatory Visit (INDEPENDENT_AMBULATORY_CARE_PROVIDER_SITE_OTHER): Payer: Medicare Other

## 2019-07-23 DIAGNOSIS — N184 Chronic kidney disease, stage 4 (severe): Secondary | ICD-10-CM

## 2019-07-23 DIAGNOSIS — E1122 Type 2 diabetes mellitus with diabetic chronic kidney disease: Secondary | ICD-10-CM | POA: Diagnosis not present

## 2019-07-23 DIAGNOSIS — Z794 Long term (current) use of insulin: Secondary | ICD-10-CM

## 2019-07-23 DIAGNOSIS — I5032 Chronic diastolic (congestive) heart failure: Secondary | ICD-10-CM

## 2019-07-23 DIAGNOSIS — R5381 Other malaise: Secondary | ICD-10-CM

## 2019-07-23 LAB — NOVEL CORONAVIRUS, NAA (HOSP ORDER, SEND-OUT TO REF LAB; TAT 18-24 HRS): SARS-CoV-2, NAA: NOT DETECTED

## 2019-07-23 NOTE — Telephone Encounter (Signed)
-----   Message from Charlie Pitter, [place] sent at 07/23/2019  4:07 PM EDT ----- Let pt know covid test was negative. It was ordered under my name, I think for sleep study? Will cc Gershon Cull too!  Dayna Dunn PA-C

## 2019-07-23 NOTE — Patient Instructions (Signed)
Social Worker Visit Information  Goals we discussed today:  Goals Addressed            This Visit's Progress     Patient Stated   . "I need transportation resources to get to the doctor" (pt-stated)   On track    Current Barriers:  . Limited access to transportation resources due to living in [facility] and physician being in [place] . Lacks knowledge of transportation benefit offered by health plan  Clinical Social Work Clinical Goal(s):  [name] Over the next 30 days patient will become more knowledgeable of health plan benefit  CCM SW Interventions: Completed 07/23/2019 . Outbound call to the patient to confirm receipt of mailed resources . Determined the patient has yet to receive transportation resources previously mailed by SW . Routed letter and resource information via MyChart to the patient who confirms she accesses MyChart . Reviewed transportation benefit with the patient and how to access . Inbound call received from patients daughter [name] who reports upcomming pulmonology appointment on October 1 at 4:00 pm. "I need help getting her there and then I can meet her after work and drive her home" . Assessed for patient ambulation status. Determined the patient is utilizing a wheelchair due to recent ankle fracture but can stand and pivot in and out of vehicles . Collaboration with [place] services to arrange transportation the [facility] on 07/25/2019 at 4:00pm . Received confirmation the patient would be picked up at 3:15 pm . Outbound call to [place] to confirm transportation arrangements. [name] will communicate pick-up time with the patient . Scheduled follow up call over the next 3 weeks to assess patient understanding of how to access transportation benefit  Patient Self Care Activities:  . Currently UNABLE TO independently drive self to physician appointments  Please see past updates related to this goal by clicking on the "Past  Updates" button in the selected goal                How to Schedule Transportation  using [place]   Your Transportation benefit: Your plan provides rides to physician appointments within 50 miles of your home. Call to confirm number of trips available to you.   How to schedule: Call [phone]  . Have a pen and paper to write down your reservation number  When to schedule: You must call at least 3 business days before your appointment date but can call up to 2 weeks ahead of time  Information needed when calling:  . Your insurance card with member ID number . Appointment date and time . Address of appointment . Phone number of physician office . Time you wish to be picked up from your appointment to return home. You can opt to call when ready however, due to other needs you may wait up to 60 minutes for driver to return.   If you choose to call when finished with appointment you need to call: [phone] Be sure to have your reservation number when calling to be picked-up   Materials Provided: Yes: Provided transportation resource information via MyChart  Follow Up Plan: SW will follow up with patient by phone over the next 3 weeks.   [name], BSW, CDP Social Worker, Certified Dementia Practitioner [place] [facility] [place] [facility] [phone]

## 2019-07-23 NOTE — Telephone Encounter (Signed)
Called patient results and encouraged her daughter to call the sleep lab in the morning to get in for her sleep study as she must quarantine for 3 days after her covid test before completing her sleep study or risk having to re-take her covid test again. Daughter/Patient are aware and agreeable to treatment.

## 2019-07-23 NOTE — Chronic Care Management (AMB) (Signed)
Chronic Care Management    Social Work Follow Up Note  07/23/2019 Name: [name] MRN: [id number] DOB: Nov 24, 1932  [name] is a 85 y.o. year old female who is a primary care patient of [name], MD. The CCM team was consulted for assistance with Transportation Needs .   Review of patient status, including review of consultants reports, other relevant assessments, and collaboration with appropriate care team members and the patient's provider was performed as part of comprehensive patient evaluation and provision of chronic care management services.    I placed a follow up call to the patient to continue assisting with patient need for transportation resources.  SDOH (Social Determinants of Health) screening performed today: Transportation. See Care Plan for related entries.   Outpatient Encounter Medications as of 07/23/2019  Medication Sig  . acetaminophen (TYLENOL) 500 MG tablet Take 1,000 mg by mouth every 6 (six) hours as needed for mild pain, moderate pain or headache.   [name] amiodarone (PACERONE) 200 MG tablet Take 1 tablet (200 mg total) by mouth daily.  [name] anastrozole (ARIMIDEX) 1 MG tablet Take 1 tablet (1 mg total) by mouth daily.  [name] apixaban (ELIQUIS) 2.5 MG TABS tablet Take 1 tablet (2.5 mg total) by mouth 2 (two) times daily.  [name] atorvastatin (LIPITOR) 20 MG tablet Take 1 tablet (20 mg total) by mouth at bedtime.  . BD PEN NEEDLE NANO U/F 32G X 4 MM MISC Use as directed  . calcium carbonate (OS-CAL) 600 MG TABS tablet Take by mouth.  . carvedilol (COREG) 12.5 MG tablet Take 12.5 mg by mouth 2 (two) times daily with a meal.  . diclofenac sodium (VOLTAREN) 1 % GEL Apply 2 g topically 4 (four) times daily.  [name] doxycycline (VIBRAMYCIN) 100 MG capsule Take 1 capsule (100 mg total) by mouth 2 (two) times daily.  . ferrous sulfate 325 (65 FE) MG tablet Take 325 mg by mouth daily with breakfast.  . glucose blood (ACCU-CHEK GUIDE) test strip Use as instructed to check blood  sugars 3 times per day prn dx: e11.22  . HUMALOG KWIKPEN 200 UNIT/ML SOPN Inject 4-8 Units into the skin 3 (three) times daily. Per sliding scale  . isosorbide-hydrALAZINE (BIDIL) 20-37.5 MG tablet Take 1 tablet by mouth 3 (three) times daily.   [name] LEVEMIR FLEXTOUCH 100 UNIT/ML Pen Inject 10-16 Units into the skin daily. 16 units in the am and 10 units in the evening (Patient taking differently: Inject 8-16 Units into the skin daily. 16 units in the am and 8 units in the evening)  . magnesium oxide (MAG-OX) 400 MG tablet Take 400 mg by mouth daily.  [name] omeprazole (PRILOSEC) 40 MG capsule TAKE ONE CAPSULE BY MOUTH DAILY BEFORE A MEAL  . oxybutynin (DITROPAN) 5 MG tablet Take 1 tablet (5 mg total) by mouth 2 (two) times daily.  . potassium chloride SA (K-DUR) 20 MEQ tablet Take 2 tablets (40 mEq total) by mouth daily.  . pregabalin (LYRICA) 75 MG capsule Take 1 capsule (75 mg total) by mouth at bedtime.  . torsemide (DEMADEX) 20 MG tablet Take 2 tablets by mouth twice a day X's 3 days then take 2 tablets by mouth in the a.m. and 1 tablet by mouth in the p.m  . traMADol (ULTRAM) 50 MG tablet Take 50 mg by mouth 2 (two) times daily as needed for pain.   No facility-administered encounter medications on file as of 07/23/2019.      Goals Addressed  This Visit's Progress     Patient Stated   . "I need transportation resources to get to the doctor" (pt-stated)   On track    Current Barriers:  . Limited access to transportation resources due to living in [facility] and physician being in [place] . Lacks knowledge of transportation benefit offered by health plan  Clinical Social Work Clinical Goal(s):  [name] Over the next 30 days patient will become more knowledgeable of health plan benefit  CCM SW Interventions: Completed 07/23/2019 . Outbound call to the patient to confirm receipt of mailed resources . Determined the patient has yet to receive transportation resources previously mailed  by SW . Routed letter and resource information via MyChart to the patient who confirms she accesses MyChart . Reviewed transportation benefit with the patient and how to access . Discussed importance of attending all upcomming provider appointments to assist with management of patient chronic conditions . Inbound call received from patients daughter [name] who reports upcomming pulmonology appointment on October 1 at 4:00 pm. "I need help getting her there and then I can meet her after work and drive her home" . Assessed for patient ambulation status. Determined the patient is utilizing a wheelchair due to recent ankle fracture but can stand and pivot in and out of vehicles . Collaboration with [place] services to arrange transportation the [facility] on 07/25/2019 at 4:00pm . Received confirmation the patient would be picked up at 3:15 pm . Outbound call to [place] to confirm transportation arrangements. [name] will communicate pick-up time with the patient . Scheduled follow up call over the next 3 weeks to assess patient understanding of how to access transportation benefit  Patient Self Care Activities:  . Currently UNABLE TO independently drive self to physician appointments  Please see past updates related to this goal by clicking on the "Past Updates" button in the selected goal                 Follow Up Plan: SW will follow up with patient by phone over the next 3 weeks.   [name], BSW, CDP Social Worker, Certified Dementia Practitioner [place] [facility] [place] [facility] [phone]  Total time spent performing care coordination and/or care management activities with the patient by phone or face to face = 15 minutes.

## 2019-07-24 ENCOUNTER — Other Ambulatory Visit: Payer: Self-pay

## 2019-07-24 ENCOUNTER — Ambulatory Visit: Payer: Self-pay | Admitting: Pharmacist

## 2019-07-24 DIAGNOSIS — I5032 Chronic diastolic (congestive) heart failure: Secondary | ICD-10-CM

## 2019-07-24 DIAGNOSIS — E1122 Type 2 diabetes mellitus with diabetic chronic kidney disease: Secondary | ICD-10-CM

## 2019-07-24 NOTE — Patient Outreach (Signed)
[facility]) Care Management  07/24/2019  [name] 14-Jan-1933 [id number]   Medication Adherence call to [name] [place] Identifiers Verify spoke with patient's daughter she explain patient is no longer taking Lisinopril 2.5 mg patient was having side effects doctor took her off. [name] is showing past due under [place].   [facility] Direct Dial [phone]  Fax [phone] [name]

## 2019-07-25 ENCOUNTER — Ambulatory Visit (INDEPENDENT_AMBULATORY_CARE_PROVIDER_SITE_OTHER): Payer: Medicare Other | Admitting: Internal Medicine

## 2019-07-25 ENCOUNTER — Other Ambulatory Visit: Payer: Self-pay

## 2019-07-25 DIAGNOSIS — I50810 Right heart failure, unspecified: Secondary | ICD-10-CM | POA: Diagnosis not present

## 2019-07-25 LAB — PULMONARY FUNCTION TEST
DL/VA % pred: 106 %
DL/VA: 4.41 ml/min/mmHg/L
DLCO cor % pred: 107 %
DLCO cor: 17.75 ml/min/mmHg
DLCO unc % pred: 94 %
DLCO unc: 15.57 ml/min/mmHg
FEF 25-75 Post: 2.6 L/sec
FEF 25-75 Pre: 1.75 L/sec
FEF2575-%Change-Post: 48 %
FEF2575-%Pred-Post: 280 %
FEF2575-%Pred-Pre: 188 %
FEV1-%Change-Post: 6 %
FEV1-%Pred-Post: 158 %
FEV1-%Pred-Pre: 149 %
FEV1-Post: 1.83 L
FEV1-Pre: 1.72 L
FEV1FVC-%Change-Post: 4 %
FEV1FVC-%Pred-Pre: 109 %
FEV6-%Change-Post: 1 %
FEV6-%Pred-Post: 150 %
FEV6-%Pred-Pre: 148 %
FEV6-Post: 2.13 L
FEV6-Pre: 2.11 L
FEV6FVC-%Pred-Post: 105 %
FEV6FVC-%Pred-Pre: 105 %
FVC-%Change-Post: 1 %
FVC-%Pred-Post: 141 %
FVC-%Pred-Pre: 140 %
FVC-Post: 2.13 L
FVC-Pre: 2.11 L
Post FEV1/FVC ratio: 86 %
Post FEV6/FVC ratio: 100 %
Pre FEV1/FVC ratio: 82 %
Pre FEV6/FVC Ratio: 100 %
RV % pred: 109 %
RV: 2.52 L
TLC % pred: 108 %
TLC: 4.92 L

## 2019-07-25 NOTE — Progress Notes (Signed)
Full PFT performed today. °

## 2019-07-26 ENCOUNTER — Ambulatory Visit: Payer: Self-pay

## 2019-07-26 DIAGNOSIS — E1122 Type 2 diabetes mellitus with diabetic chronic kidney disease: Secondary | ICD-10-CM

## 2019-07-26 DIAGNOSIS — Z794 Long term (current) use of insulin: Secondary | ICD-10-CM

## 2019-07-26 NOTE — Patient Instructions (Signed)
Social Worker Visit Information  Goals we discussed today:  Goals Addressed            This Visit's Progress     Patient Stated   . "I need transportation resources to get to the doctor" (pt-stated)       Current Barriers:  . Limited access to transportation resources due to living in [facility] and physician being in [place] . Lacks knowledge of transportation benefit offered by health plan  Clinical Social Work Clinical Goal(s):  [name] Over the next 30 days patient will become more knowledgeable of health plan benefit  CCM SW Interventions: Completed 07/23/2019 . Outbound call to the patients daughter, [name] in response to a voice message left requesting a return call . Determined the patient was successful in attending her physician appointment on 07/25/2019 . Educated [name] on the patients insurance benefit that can assist with transportation to [place] future appointments . Determined the patient has an upcomming sleep study scheduled for Friday October 9th . Provided the contact number for [place] benefit to [place] to allow her to schedule transportation for the patient . Encouraged [name] to contact SW if assistance is needed in scheduling transportation to sleep study appointment  Patient Self Care Activities:  . Currently UNABLE TO independently drive self to physician appointments  Please see past updates related to this goal by clicking on the "Past Updates" button in the selected goal               Other   . "My mom needs ted hose and diabetic shoes"       Daughter [name] stated:  Current Barriers:  . Lacks ability to obtain items without physician orders  Clinical Social Work Clinical Goal(s):  [name] Over the next 45 days the patient will work with SW to obtain desired DME supplies  CCM SW Interventions: Completed with [place] on 07/26/2019 . Outbound call to the patients daughter to assess patients current case management  needs . Determined the patient is in need of new diabetic shoes as well as ted hose . Educated [place] on the process to obtain these items . Riesa Pope SW would request prescription from the patients provider  . Collaboration with the patients primary provider, Dr. [name] to request orders be sent for the patient to obtain appropriate equipment . Scheduled follow up call to the patient and/or her daughter within the next two weeks to assist with goal progression  Patient Self Care Activities:  . Self administers medications as prescribed . Performs ADL's independently . Calls provider office for new concerns or questions  Initial goal documentation         Materials Provided: Verbal education about the patients health plan benefits provided by phone  Follow Up Plan: SW will follow up with patient by phone over the next 10 days.   [name], BSW, CDP Social Worker, Certified Dementia Practitioner [place] [facility] [place] [facility] [phone]

## 2019-07-26 NOTE — Chronic Care Management (AMB) (Signed)
Chronic Care Management   Social Work Follow Up Note  07/26/2019 Name: [name] MRN: [id number] DOB: 12-13-1932  [name] is a 85 y.o. year old female who is a primary care patient of [name], MD. The CCM team was consulted for assistance with chronic care management and care coordination.   Review of patient status, including review of consultants reports, other relevant assessments, and collaboration with appropriate care team members and the patient's provider was performed as part of comprehensive patient evaluation and provision of chronic care management services.    I placed an outbound call to the patients daughter in response to a voice message received requesting assistance. See care plan below.  Outpatient Encounter Medications as of 07/26/2019  Medication Sig  . acetaminophen (TYLENOL) 500 MG tablet Take 1,000 mg by mouth every 6 (six) hours as needed for mild pain, moderate pain or headache.   [name] amiodarone (PACERONE) 200 MG tablet Take 1 tablet (200 mg total) by mouth daily.  [name] anastrozole (ARIMIDEX) 1 MG tablet Take 1 tablet (1 mg total) by mouth daily.  [name] apixaban (ELIQUIS) 2.5 MG TABS tablet Take 1 tablet (2.5 mg total) by mouth 2 (two) times daily.  [name] atorvastatin (LIPITOR) 20 MG tablet Take 1 tablet (20 mg total) by mouth at bedtime.  . BD PEN NEEDLE NANO U/F 32G X 4 MM MISC Use as directed  . calcium carbonate (OS-CAL) 600 MG TABS tablet Take by mouth.  . carvedilol (COREG) 12.5 MG tablet Take 12.5 mg by mouth 2 (two) times daily with a meal.  . diclofenac sodium (VOLTAREN) 1 % GEL Apply 2 g topically 4 (four) times daily.  [name] doxycycline (VIBRAMYCIN) 100 MG capsule Take 1 capsule (100 mg total) by mouth 2 (two) times daily.  . ferrous sulfate 325 (65 FE) MG tablet Take 325 mg by mouth daily with breakfast.  . 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 HUMALOG KWIKPEN 200 UNIT/ML SOPN  Inject 4-8 Units into the skin 3 (three) times daily. Per sliding scale  . isosorbide-hydrALAZINE (BIDIL) 20-37.5 MG tablet Take 1 tablet by mouth 3 (three) times daily.   [name] LEVEMIR FLEXTOUCH 100 UNIT/ML Pen Inject 10-16 Units into the skin daily. 16 units in the am and 10 units in the evening (Patient taking differently: Inject 8-16 Units into the skin daily. 16 units in the am and 8 units in the evening)  . magnesium oxide (MAG-OX) 400 MG tablet Take 400 mg by mouth daily.  [name] omeprazole (PRILOSEC) 40 MG capsule TAKE ONE CAPSULE BY MOUTH DAILY BEFORE A MEAL  . oxybutynin (DITROPAN) 5 MG tablet Take 1 tablet (5 mg total) by mouth 2 (two) times daily.  . potassium chloride SA (K-DUR) 20 MEQ tablet Take 2 tablets (40 mEq total) by mouth daily.  . pregabalin (LYRICA) 75 MG capsule Take 1 capsule (75 mg total) by mouth at bedtime.  . torsemide (DEMADEX) 20 MG tablet Take 2 tablets by mouth twice a day X's 3 days then take 2 tablets by mouth in the a.m. and 1 tablet by mouth in the p.m  . traMADol (ULTRAM) 50 MG tablet Take 50 mg by mouth 2 (two) times daily as needed for pain.   No facility-administered encounter medications on file as of 07/26/2019.      Goals Addressed            This Visit's Progress  Patient Stated   . "I need transportation resources to get to the doctor" (pt-stated)       Current Barriers:  . Limited access to transportation resources due to living in [facility] and physician being in [place] . Lacks knowledge of transportation benefit offered by health plan  Clinical Social Work Clinical Goal(s):  [name] Over the next 30 days patient will become more knowledgeable of health plan benefit  CCM SW Interventions: Completed 07/23/2019 . Outbound call to the patients daughter, [name] in response to a voice message left requesting a return call . Determined the patient was successful in attending her physician appointment on 07/25/2019 . Educated [name] on the  patients insurance benefit that can assist with transportation to [place] future appointments . Determined the patient has an upcomming sleep study scheduled for Friday October 9th . Provided the contact number for [place] benefit to [place] to allow her to schedule transportation for the patient . Encouraged [name] to contact SW if assistance is needed in scheduling transportation to sleep study appointment  Patient Self Care Activities:  . Currently UNABLE TO independently drive self to physician appointments  Please see past updates related to this goal by clicking on the "Past Updates" button in the selected goal               Other   . "My mom needs ted hose and diabetic shoes"       Daughter [name] stated:  Current Barriers:  . Lacks ability to obtain items without physician orders  Clinical Social Work Clinical Goal(s):  [name] Over the next 45 days the patient will work with SW to obtain desired DME supplies  CCM SW Interventions: Completed with [facility] on 07/26/2019 . Outbound call to the patients daughter to assess patients current case management needs . Determined the patient is in need of new diabetic shoes as well as ted hose . Educated [place] on the process to obtain these items . Riesa Pope SW would request prescription from the patients provider  . Collaboration with the patients primary provider, Dr. [name] to request orders be sent for the patient to obtain appropriate equipment . Scheduled follow up call to the patient and/or her daughter within the next two weeks to assist with goal progression  Patient Self Care Activities:  . Self administers medications as prescribed . Performs ADL's independently . Calls provider office for new concerns or questions  Initial goal documentation         Follow Up Plan: SW will follow up with patient by phone over the next 10 days.  [name], BSW, CDP Social Worker,  Certified Dementia Practitioner [place] [facility] [place] [facility] [phone]  Total time spent performing care coordination and/or care management activities with the patient by phone or face to face = 11 minutes.

## 2019-07-28 NOTE — Patient Instructions (Signed)
Visit Information  Goals Addressed            This Visit's Progress     Patient Stated   . COMPLETED: My medications are too expensive (pt-stated)       Current Barriers:  . Financial Barriers  Pharmacist Clinical Goal(s):  [name] Over the next 90 days, patient will work with CCM team/PharmD to address needs related to medications (financial)  Interventions: . Comprehensive medication review performed. [name] Collaboration with health plan regarding medication benefit . Collaboration with RN Care Manager re: medications/DM   . Call placed to [facility] pharmacy-->discovered patient has FULL Extra Help/LIS meaning that her copays are $8.90 (brand meds, 105-month supply) and $3.40 (generic meds, 60-month supply).  Unfortunately, she does not qualify for manufacturers programs due to FULL LIS/extra help.   . Will request new RX written for Lantus pens & Humalog pens qty: 37mL (8 insulin pens=90 day supply).  New RXs not needed until June 2020 since 90-day supply filled April 2020-->will follow . Will collaborate with CCM BSW regarding Medicaid and other resources. . Will continue to explore cost saving options . Will ensure all RXs are written for 38-month supplies.  Patient Self Care Activities:  . Self administers medications as prescribed . Attends all scheduled provider appointments . Calls pharmacy for medication refills  Please see past updates related to this goal by clicking on the "Past Updates" button in the selected goal      . My sugars are running higher (pt-stated)        Current Barriers:  . Diabetes: T2DM; most recent A1c 7.9% on 06/08/19 (A1c was 8.4%)  . Current antihyperglycemic regimen: Levemir twice daily (16 units in AM, 10 units  in PM), Humalog sliding scale o Could consider Tyler Aas for steady control instead of twice daily Levemir . denies hypoglycemic symptoms; denies hyperglycemic symptoms . Current exercise: n/a . Current blood glucose readings: 100s to  200s . Cardiovascular risk reduction: o Current hypertensive regimen: carvedilol, isosorbide/hydralazine o Current hyperlipidemia regimen: atorvastatin 20mg  daily (last filled 8/20.20 or #90)  Pharmacist Clinical Goal(s):  [name] Over the next 90 days, patient with work with PharmD and primary care provider to address needs related to optimized medication management of chronic conditions.  Interventions: Call completed on 07/24/19 . Comprehensive medication review performed, medication list updated in electronic medical record . Discussed diet restrictions/suggestions  Patient Self Care Activities:  . Patient will check blood glucose 3 times daily , document, and provide at future appointments . Patient will focus on medication adherence by continuing to take medications as prescribed . Patient will take medications as prescribed . Patient will contact provider with any episodes of hypoglycemia . Patient will report any questions or concerns to provider    Please see past updates related to this goal by clicking on the "Past Updates" button in the selected goal         The patient verbalized understanding of instructions provided today and declined a print copy of patient instruction materials.   The care management team will reach out to the patient again over the next 4-6 weeks.  Regina Eck, PharmD, BCPS Clinical Pharmacist, [facility] [place]: [phone]

## 2019-07-28 NOTE — Progress Notes (Signed)
Chronic Care Management   Initial Visit Note  07/24/2019 Name: [name] MRN: [id number] DOB: 1932-12-11  Referred by: [name], MD Reason for referral : Chronic Care Management   [name] is a 85 y.o. year old female who is a primary care patient of [name], MD. The CCM team was consulted for assistance with chronic disease management and care coordination needs.   Review of patient status, including review of consultants reports, relevant laboratory and other test results, and collaboration with appropriate care team members and the patient's provider was performed as part of comprehensive patient evaluation and provision of chronic care management services.    I spoke with [name] and granddaughter by telephone today.  Advanced Directives Status: N See Care Plan and [employer] application for related entries.   Medications: Outpatient Encounter Medications as of 07/24/2019  Medication Sig  . acetaminophen (TYLENOL) 500 MG tablet Take 1,000 mg by mouth every 6 (six) hours as needed for mild pain, moderate pain or headache.   [name] amiodarone (PACERONE) 200 MG tablet Take 1 tablet (200 mg total) by mouth daily.  [name] anastrozole (ARIMIDEX) 1 MG tablet Take 1 tablet (1 mg total) by mouth daily.  [name] apixaban (ELIQUIS) 2.5 MG TABS tablet Take 1 tablet (2.5 mg total) by mouth 2 (two) times daily.  [name] atorvastatin (LIPITOR) 20 MG tablet Take 1 tablet (20 mg total) by mouth at bedtime.  . BD PEN NEEDLE NANO U/F 32G X 4 MM MISC Use as directed  . calcium carbonate (OS-CAL) 600 MG TABS tablet Take by mouth.  . carvedilol (COREG) 12.5 MG tablet Take 12.5 mg by mouth 2 (two) times daily with a meal.  . diclofenac sodium (VOLTAREN) 1 % GEL Apply 2 g topically 4 (four) times daily.  [name] doxycycline (VIBRAMYCIN) 100 MG capsule Take 1 capsule (100 mg total) by mouth 2 (two) times daily.  . ferrous sulfate 325 (65 FE) MG tablet Take 325 mg by mouth daily with breakfast.  . 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 HUMALOG KWIKPEN 200 UNIT/ML SOPN Inject 4-8 Units into the skin 3 (three) times daily. Per sliding scale  . isosorbide-hydrALAZINE (BIDIL) 20-37.5 MG tablet Take 1 tablet by mouth 3 (three) times daily.   [name] LEVEMIR FLEXTOUCH 100 UNIT/ML Pen Inject 10-16 Units into the skin daily. 16 units in the am and 10 units in the evening (Patient taking differently: Inject 8-16 Units into the skin daily. 16 units in the am and 8 units in the evening)  . magnesium oxide (MAG-OX) 400 MG tablet Take 400 mg by mouth daily.  [name] omeprazole (PRILOSEC) 40 MG capsule TAKE ONE CAPSULE BY MOUTH DAILY BEFORE A MEAL  . oxybutynin (DITROPAN) 5 MG tablet Take 1 tablet (5 mg total) by mouth 2 (two) times daily.  . potassium chloride SA (K-DUR) 20 MEQ tablet Take 2 tablets (40 mEq total) by mouth daily.  . pregabalin (LYRICA) 75 MG capsule Take 1 capsule (75 mg total) by mouth at bedtime.  . torsemide (DEMADEX) 20 MG tablet Take 2 tablets by mouth twice a day X's 3 days then take 2 tablets by mouth in the a.m. and 1 tablet by mouth in the p.m  . traMADol (ULTRAM) 50 MG tablet Take 50 mg by mouth 2 (two) times daily as needed for pain.   No facility-administered encounter medications on file as of 07/24/2019.  Objective:   Goals Addressed            This Visit's Progress     Patient Stated   . COMPLETED: My medications are too expensive (pt-stated)       Current Barriers:  . Financial Barriers  Pharmacist Clinical Goal(s):  [name] Over the next 90 days, patient will work with CCM team/PharmD to address needs related to medications (financial)  Interventions: . Comprehensive medication review performed. [name] Collaboration with health plan regarding medication benefit . Collaboration with RN Care Manager re: medications/DM   . [facility] to [facility]-->discovered patient has FULL Extra Help/LIS meaning that  her copays are $8.90 (brand meds, 79-month supply) and $3.40 (generic meds, 32-month supply).  Unfortunately, she does not qualify for manufacturers programs due to FULL LIS/extra help.   . Will request new RX written for Lantus pens & Humalog pens qty: 70mL (8 insulin pens=90 day supply).  New RXs not needed until June 2020 since 90-day supply filled April 2020-->will follow . Will collaborate with CCM BSW regarding Medicaid and other resources. . Will continue to explore cost saving options . Will ensure all RXs are written for 64-month supplies.  Patient Self Care Activities:  . Self administers medications as prescribed . Attends all scheduled provider appointments . Calls pharmacy for medication refills  Please see past updates related to this goal by clicking on the "Past Updates" button in the selected goal      . My sugars are running higher (pt-stated)        Current Barriers:  . Diabetes: T2DM; most recent A1c 7.9% on 06/08/19 (A1c was 8.4%)  . Current antihyperglycemic regimen: Levemir twice daily (16 units in AM, 10 units  in PM), Humalog sliding scale o Could consider Tyler Aas for steady control instead of twice daily Levemir . denies hypoglycemic symptoms; denies hyperglycemic symptoms . Current exercise: n/a . Current blood glucose readings: 100s to 200s . Cardiovascular risk reduction: o Current hypertensive regimen: carvedilol, isosorbide/hydralazine o Current hyperlipidemia regimen: atorvastatin 20mg  daily (last filled 8/20.20 or #90)  Pharmacist Clinical Goal(s):  [name] Over the next 90 days, patient with work with PharmD and primary care provider to address needs related to optimized medication management of chronic conditions.  Interventions: Call completed on 07/24/19 . Comprehensive medication review performed, medication list updated in electronic medical record . Discussed diet restrictions/suggestions  Patient Self Care Activities:  . Patient will check blood  glucose 3 times daily , document, and provide at future appointments . Patient will focus on medication adherence by continuing to take medications as prescribed . Patient will take medications as prescribed . Patient will contact provider with any episodes of hypoglycemia . Patient will report any questions or concerns to provider    Please see past updates related to this goal by clicking on the "Past Updates" button in the selected goal          Plan:   The care management team will reach out to the patient again over the next 4-6 weeks.  Regina Eck, PharmD, BCPS Clinical Pharmacist, [facility] [place]: [phone]

## 2019-07-29 ENCOUNTER — Ambulatory Visit: Payer: Self-pay

## 2019-07-29 ENCOUNTER — Other Ambulatory Visit: Payer: Self-pay | Admitting: Internal Medicine

## 2019-07-29 DIAGNOSIS — C50311 Malignant neoplasm of lower-inner quadrant of right female breast: Secondary | ICD-10-CM

## 2019-07-29 DIAGNOSIS — N184 Chronic kidney disease, stage 4 (severe): Secondary | ICD-10-CM

## 2019-07-29 DIAGNOSIS — E1122 Type 2 diabetes mellitus with diabetic chronic kidney disease: Secondary | ICD-10-CM

## 2019-07-29 DIAGNOSIS — Z170 Estrogen receptor positive status [ER+]: Secondary | ICD-10-CM

## 2019-07-29 DIAGNOSIS — Z794 Long term (current) use of insulin: Secondary | ICD-10-CM

## 2019-07-29 NOTE — Patient Instructions (Signed)
Social Worker Visit Information  Goals we discussed today:  Goals Addressed            This Visit's Progress     Patient Stated   . "I need to see the doctor at the [facility] in [place]" (pt-stated)       Current Barriers:  . Extended time away from practice due to short-term stay in [place] . Knowledge deficits related to obtaining a referral  Social Work Clinical Goal(s):  [name] Over the next 10 days the patient will work with SW to obtain a referral to [employer] . Over the next 30 days the patient will follow up with [facility] as directed by SW to obtain a follow up appointment regarding previously diagnosed cancer  CCM SW Interventions: Completed 07/29/2019 with the patient and her daughter [name] . Patient interviewed and appropriate assessments performed . Determined the patient would like to continue cancer treatment in [place] at [facility] after returning from [place] . Advised the patient and her daughter to contact the [facility] to request a follow up appointment . Informed by the patients daughter, [name], contact was made and a voice message was left requesting to schedule an appointment for the patient "they haven't called back and she was seen over 6 months ago" . Collaboration with the patients primary provider Dr [name] to request a referral for the patient to receive Oncology care . Reviewed current treatment plan with the patient and her daughter. [name] reports the patient is taking a white pill daily. The patient had plans to have a lumpectomy from [facility] but prefers to receive care in [place] at this time . Collaboration with RN Case Manager regarding patient newly identified goal  Patient Self Care Activities:  . Self administers medications as prescribed . Calls pharmacy for medication refills . Performs ADL's independently  Initial goal documentation     . "I need transportation resources to  get to the doctor" (pt-stated)   On track    Current Barriers:  . Limited access to transportation resources due to living in [facility] and physician being in [place] . Lacks knowledge of transportation benefit offered by health plan  Clinical Social Work Clinical Goal(s):  [name] Over the next 30 days patient will become more knowledgeable of health plan benefit  CCM SW Interventions: Completed 07/29/2019 with the patient and her daughter [name] . Outbound call to assess progression of patient stated goal . Spoke to both the patient and her daughter during a joint call . Reviewed the patients transportation benefit including how to schedule a ride and the timeframe scheduling must occur (3 business days prior to an appointment) . Assessed for upcomming transportation needs. The patient reports a future appointment at [facility] on October 14th . Discussed plans for the patients daughter to contact [facility] to confirm appointment time prior to arranging transportation via the patients health plan benefit . Provided the contact number to the health plan to arrange transportation services . Educated both the patient and her daughter where to locate the transportation service phone number on the back of the patients insurance card . Encouraged the patient and/or her daughter to contact SW if assistance is needed  Patient Self Care Activities:  . Currently UNABLE TO independently drive self to physician appointments  Please see past updates related to this goal by clicking on the "Past Updates" button in the selected goal  Other   . "My mom needs ted hose and diabetic shoes"       Daughter [name] stated:  Current Barriers:  . Lacks ability to obtain items without physician orders  Clinical Social Work Clinical Goal(s):  [name] Over the next 45 days the patient will work with SW to obtain desired DME supplies  CCM SW Interventions: Completed with  [name] on 07/29/2019 . Collaboration with the patients primary physician Dr Baird Cancer whom requests the patient receive orders for diabetic shoes from podiatry . Outbound call to the patients daughter [name] whom denies patient involvement with a podiatrist . Participated in a joint call with both the the patients and her daughter to discuss why a podiatrist would be more appropriate to assist with an order for diabetic shoes . Obtained verbal permission for a referral to be placed for the patient to be seen by podiatry . Collaboration with [name] regarding patient agreement for referral  Patient Self Care Activities:  . Self administers medications as prescribed . Performs ADL's independently . Calls provider office for new concerns or questions  Please see past updates related to this goal by clicking on the "Past Updates" button in the selected goal          Materials Provided: Verbal education about health plan benefits provided by phone  Follow Up Plan: SW will follow up with patient by phone over the next 5 days to confirm transportation arrangments have been secured for upcomming appointment   [name], BSW, CDP Social Worker, Certified Dementia Practitioner [place] [facility] [place] [facility] [phone]

## 2019-07-29 NOTE — Chronic Care Management (AMB) (Signed)
Chronic Care Management   Social Work Follow Up Note  07/29/2019 Name: [name] MRN: [id number] DOB: Jul 05, 1933  [name] is a 85 y.o. year old female who is a primary care patient of [name], MD. The CCM team was consulted for assistance with [employer] .   Review of patient status, including review of consultants reports, other relevant assessments, and collaboration with appropriate care team members and the patient's provider was performed as part of comprehensive patient evaluation and provision of chronic care management services.    I placed a follow up call to the patients daughter [name] in response to a communication received from the patients provider Dr. [name]. I participated in a joint call with both the patient and her daughter, [name] to discuss opportunity for a podiatry referral to assist with orders for diabetic shoes. The patient also expressed concern for a referral to the [facility] as well as continued knowledge deficit of transportation resources. See care plan below for today's interventions.  Outpatient Encounter Medications as of 07/29/2019  Medication Sig  . acetaminophen (TYLENOL) 500 MG tablet Take 1,000 mg by mouth every 6 (six) hours as needed for mild pain, moderate pain or headache.   [name] amiodarone (PACERONE) 200 MG tablet Take 1 tablet (200 mg total) by mouth daily.  [name] anastrozole (ARIMIDEX) 1 MG tablet Take 1 tablet (1 mg total) by mouth daily.  [name] apixaban (ELIQUIS) 2.5 MG TABS tablet Take 1 tablet (2.5 mg total) by mouth 2 (two) times daily.  [name] atorvastatin (LIPITOR) 20 MG tablet Take 1 tablet (20 mg total) by mouth at bedtime.  . BD PEN NEEDLE NANO U/F 32G X 4 MM MISC Use as directed  . calcium carbonate (OS-CAL) 600 MG TABS tablet Take by mouth.  . carvedilol (COREG) 12.5 MG tablet Take 12.5 mg by mouth 2 (two) times daily with a meal.  . diclofenac sodium (VOLTAREN) 1 % GEL Apply 2 g topically 4 (four) times daily.   [name] doxycycline (VIBRAMYCIN) 100 MG capsule Take 1 capsule (100 mg total) by mouth 2 (two) times daily.  . ferrous sulfate 325 (65 FE) MG tablet Take 325 mg by mouth daily with breakfast.  . 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 HUMALOG KWIKPEN 200 UNIT/ML SOPN Inject 4-8 Units into the skin 3 (three) times daily. Per sliding scale  . isosorbide-hydrALAZINE (BIDIL) 20-37.5 MG tablet Take 1 tablet by mouth 3 (three) times daily.   [name] LEVEMIR FLEXTOUCH 100 UNIT/ML Pen Inject 10-16 Units into the skin daily. 16 units in the am and 10 units in the evening (Patient taking differently: Inject 8-16 Units into the skin daily. 16 units in the am and 8 units in the evening)  . magnesium oxide (MAG-OX) 400 MG tablet Take 400 mg by mouth daily.  [name] omeprazole (PRILOSEC) 40 MG capsule TAKE ONE CAPSULE BY MOUTH DAILY BEFORE A MEAL  . oxybutynin (DITROPAN) 5 MG tablet Take 1 tablet (5 mg total) by mouth 2 (two) times daily.  . potassium chloride SA (K-DUR) 20 MEQ tablet Take 2 tablets (40 mEq total) by mouth daily.  . pregabalin (LYRICA) 75 MG capsule Take 1 capsule (75 mg total) by mouth at bedtime.  . torsemide (DEMADEX) 20 MG tablet Take 2 tablets by mouth twice a day X's 3 days then take 2 tablets by mouth in the a.m. and 1 tablet by mouth  in the p.m  . traMADol (ULTRAM) 50 MG tablet Take 50 mg by mouth 2 (two) times daily as needed for pain.   No facility-administered encounter medications on file as of 07/29/2019.      Goals Addressed            This Visit's Progress     Patient Stated   . "I need to see the doctor at the [facility] in [place]" (pt-stated)       Current Barriers:  . Extended time away from practice due to short-term stay in [place] . Knowledge deficits related to obtaining a referral  Social Work Clinical Goal(s):  [name] Over the next 10 days the patient will work with SW to obtain a referral to [employer] . Over the next 30 days the patient will follow up with [facility] as directed by SW to obtain a follow up appointment regarding previously diagnosed cancer  CCM SW Interventions: Completed 07/29/2019 with the patient and her daughter [name] . Patient interviewed and appropriate assessments performed . Determined the patient would like to continue cancer treatment in [place] at [facility] after returning from [place] . Advised the patient and her daughter to contact the [facility] to request a follow up appointment . Informed by the patients daughter, [name], contact was made and a voice message was left requesting to schedule an appointment for the patient "they haven't called back and she was seen over 6 months ago" . Collaboration with the patients primary provider Dr [name] to request a referral for the patient to receive Oncology care . Reviewed current treatment plan with the patient and her daughter. [name] reports the patient is taking a white pill daily. The patient had plans to have a lumpectomy from [facility] but prefers to receive care in [place] at this time . Collaboration with RN Case Manager regarding patient newly identified goal  Patient Self Care Activities:  . Self administers medications as prescribed . Calls pharmacy for medication refills . Performs ADL's independently  Initial goal documentation     . "I need transportation resources to get to the doctor" (pt-stated)   On track    Current Barriers:  . Limited access to transportation resources due to living in [facility] and physician being in [place] . Lacks knowledge of transportation benefit offered by health plan  Clinical Social Work Clinical Goal(s):  [name] Over the next 30 days patient will become more knowledgeable of health plan benefit  CCM SW Interventions: Completed 07/29/2019 with the patient and her daughter [name] . Outbound call to  assess progression of patient stated goal . Spoke to both the patient and her daughter during a joint call . Reviewed the patients transportation benefit including how to schedule a ride and the timeframe scheduling must occur (3 business days prior to an appointment) . Assessed for upcomming transportation needs. The patient reports a future appointment at [facility] on October 14th . Discussed plans for the patients daughter to contact [facility] to confirm appointment time prior to arranging transportation via the patients health plan benefit . Provided the contact number to the health plan to arrange transportation services . Educated both the patient and her daughter where to locate the transportation service phone number on the back of the patients insurance card . Encouraged the patient and/or her daughter to contact SW if assistance is needed  Patient Self Care Activities:  . Currently UNABLE TO independently drive self  to physician appointments  Please see past updates related to this goal by clicking on the "Past Updates" button in the selected goal               Other   . "My mom needs ted hose and diabetic shoes"       Daughter [name] stated:  Current Barriers:  . Lacks ability to obtain items without physician orders  Clinical Social Work Clinical Goal(s):  [name] Over the next 45 days the patient will work with SW to obtain desired DME supplies  CCM SW Interventions: Completed with [facility] on 07/29/2019 . Collaboration with the patients primary physician Dr Baird Cancer whom requests the patient receive orders for diabetic shoes from podiatry . Outbound call to the patients daughter [name] whom denies patient involvement with a podiatrist . Participated in a joint call with both the the patients and her daughter to discuss why a podiatrist would be more appropriate to assist with an order for diabetic shoes . Obtained verbal permission for a referral to  be placed for the patient to be seen by podiatry . Collaboration with [name] regarding patient agreement for referral  Patient Self Care Activities:  . Self administers medications as prescribed . Performs ADL's independently . Calls provider office for new concerns or questions  Please see past updates related to this goal by clicking on the "Past Updates" button in the selected goal          Follow Up Plan: SW will follow up with patient by phone over the next 5 days to confirm transportation arrangments have been secured for the patients upcomming appointment.   [name], BSW, CDP Social Worker, Certified Dementia Practitioner [place] [facility] [place] [facility] [phone]  Total time spent performing care coordination and/or care management activities with the patient by phone or face to face = 16 minutes.

## 2019-07-30 ENCOUNTER — Telehealth: Payer: Self-pay | Admitting: Hematology and Oncology

## 2019-07-30 NOTE — Telephone Encounter (Signed)
Scheduled appt per 10/6 sch message - unable to reach pt . Left message with appt date and time

## 2019-08-01 ENCOUNTER — Telehealth: Payer: Self-pay | Admitting: *Deleted

## 2019-08-01 ENCOUNTER — Ambulatory Visit: Payer: Self-pay

## 2019-08-01 DIAGNOSIS — Z794 Long term (current) use of insulin: Secondary | ICD-10-CM

## 2019-08-01 DIAGNOSIS — Z170 Estrogen receptor positive status [ER+]: Secondary | ICD-10-CM

## 2019-08-01 DIAGNOSIS — E1122 Type 2 diabetes mellitus with diabetic chronic kidney disease: Secondary | ICD-10-CM

## 2019-08-01 DIAGNOSIS — C50311 Malignant neoplasm of lower-inner quadrant of right female breast: Secondary | ICD-10-CM

## 2019-08-01 NOTE — Chronic Care Management (AMB) (Signed)
Chronic Care Management   Social Work Follow Up Note  08/01/2019 Name: [name] MRN: [id number] DOB: 15-Mar-1933  [name] is a 85 y.o. year old female who is a primary care patient of [name], MD. The CCM team was consulted for assistance with Transportation Needs .   Review of patient status, including review of consultants reports, other relevant assessments, and collaboration with appropriate care team members and the patient's provider was performed as part of comprehensive patient evaluation and provision of chronic care management services.    SDOH (Social Determinants of Health) screening performed today: Transportation. See Care Plan for related entries.    Outpatient Encounter Medications as of 08/01/2019  Medication Sig  . acetaminophen (TYLENOL) 500 MG tablet Take 1,000 mg by mouth every 6 (six) hours as needed for mild pain, moderate pain or headache.   [name] amiodarone (PACERONE) 200 MG tablet Take 1 tablet (200 mg total) by mouth daily.  [name] anastrozole (ARIMIDEX) 1 MG tablet Take 1 tablet (1 mg total) by mouth daily.  [name] apixaban (ELIQUIS) 2.5 MG TABS tablet Take 1 tablet (2.5 mg total) by mouth 2 (two) times daily.  [name] atorvastatin (LIPITOR) 20 MG tablet Take 1 tablet (20 mg total) by mouth at bedtime.  . BD PEN NEEDLE NANO U/F 32G X 4 MM MISC Use as directed  . calcium carbonate (OS-CAL) 600 MG TABS tablet Take by mouth.  . carvedilol (COREG) 12.5 MG tablet Take 12.5 mg by mouth 2 (two) times daily with a meal.  . diclofenac sodium (VOLTAREN) 1 % GEL Apply 2 g topically 4 (four) times daily.  [name] doxycycline (VIBRAMYCIN) 100 MG capsule Take 1 capsule (100 mg total) by mouth 2 (two) times daily.  . ferrous sulfate 325 (65 FE) MG tablet Take 325 mg by mouth daily with breakfast.  . 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 HUMALOG KWIKPEN 200 UNIT/ML SOPN Inject 4-8 Units into the skin 3 (three)  times daily. Per sliding scale  . isosorbide-hydrALAZINE (BIDIL) 20-37.5 MG tablet Take 1 tablet by mouth 3 (three) times daily.   [name] LEVEMIR FLEXTOUCH 100 UNIT/ML Pen Inject 10-16 Units into the skin daily. 16 units in the am and 10 units in the evening (Patient taking differently: Inject 8-16 Units into the skin daily. 16 units in the am and 8 units in the evening)  . magnesium oxide (MAG-OX) 400 MG tablet Take 400 mg by mouth daily.  [name] omeprazole (PRILOSEC) 40 MG capsule TAKE ONE CAPSULE BY MOUTH DAILY BEFORE A MEAL  . oxybutynin (DITROPAN) 5 MG tablet Take 1 tablet (5 mg total) by mouth 2 (two) times daily.  . potassium chloride SA (K-DUR) 20 MEQ tablet Take 2 tablets (40 mEq total) by mouth daily.  . pregabalin (LYRICA) 75 MG capsule Take 1 capsule (75 mg total) by mouth at bedtime.  . torsemide (DEMADEX) 20 MG tablet Take 2 tablets by mouth twice a day X's 3 days then take 2 tablets by mouth in the a.m. and 1 tablet by mouth in the p.m  . traMADol (ULTRAM) 50 MG tablet Take 50 mg by mouth 2 (two) times daily as needed for pain.   No facility-administered encounter medications on file as of 08/01/2019.      Goals Addressed            This Visit's Progress     Patient Stated   . "  I need to see the doctor at the [facility] in [place]" (pt-stated)   On track    Current Barriers:  . Extended time away from practice due to short-term stay in [place] . Knowledge deficits related to obtaining a referral  Social Work Clinical Goal(s):  [name] Over the next 10 days the patient will work with SW to obtain a referral to [employer] . Over the next 30 days the patient will follow up with [facility] as directed by SW to obtain a follow up appointment regarding previously diagnosed cancer  CCM SW Interventions: Completed 08/01/2019 with the patient and her daughter [name] . Outbound call to the patients daughter [name] to assess progression of patient stated goal .  Determined the patient has an [place] appointment at the [facility] . Assessed for transportation barriers. [name] reports successful use of the patients plan benefit to arrange transportation . Encouraged [name] to contact the CM team if needed to assist with progression of patient goal  Patient Self Care Activities:  . Self administers medications as prescribed . Calls pharmacy for medication refills . Performs ADL's independently  Please see past updates related to this goal by clicking on the "Past Updates" button in the selected goal      . COMPLETED: "I need transportation resources to get to the doctor" (pt-stated)       Current Barriers:  . Limited access to transportation resources due to living in [facility] and physician being in [place] . Lacks knowledge of transportation benefit offered by health plan  Clinical Social Work Clinical Goal(s):  [name] Over the next 30 days patient will become more knowledgeable of health plan benefit  CCM SW Interventions: Completed 08/01/2019 with the patient and her daughter [name] . Outbound call to assess progression of patient stated goal . Determined [name] has been successful in accessing the patients transportation benefit under her health plan for an [facility] appointment . Riesa Pope to contact CM SW with future transportation barriers . Goal Met  Patient Self Care Activities:  . Currently UNABLE TO independently drive self to physician appointments  Please see past updates related to this goal by clicking on the "Past Updates" button in the selected goal                 Follow Up Plan: SW will follow up with patient by phone over the next month.   [name], BSW, CDP Social Worker, Certified Dementia Practitioner [place] [facility] [place] [facility] [phone]  Total time spent performing care coordination and/or care management activities with the patient by phone or face to face  = 8 minutes.

## 2019-08-01 NOTE — Patient Instructions (Signed)
Social Worker Visit Information  Goals we discussed today:  Goals Addressed            This Visit's Progress     Patient Stated   . "I need to see the doctor at the [facility] in [place]" (pt-stated)   On track    Current Barriers:  . Extended time away from practice due to short-term stay in [place] . Knowledge deficits related to obtaining a referral  Social Work Clinical Goal(s):  [name] Over the next 10 days the patient will work with SW to obtain a referral to [employer] . Over the next 30 days the patient will follow up with [facility] as directed by SW to obtain a follow up appointment regarding previously diagnosed cancer  CCM SW Interventions: Completed 08/01/2019 with the patient and her daughter [name] . Outbound call to the patients daughter [name] to assess progression of patient stated goal . Determined the patient has an [place] appointment at the [facility] . Assessed for transportation barriers. [name] reports successful use of the patients plan benefit to arrange transportation . Encouraged [name] to contact the CM team if needed to assist with progression of patient goal  Patient Self Care Activities:  . Self administers medications as prescribed . Calls pharmacy for medication refills . Performs ADL's independently  Please see past updates related to this goal by clicking on the "Past Updates" button in the selected goal      . COMPLETED: "I need transportation resources to get to the doctor" (pt-stated)       Current Barriers:  . Limited access to transportation resources due to living in [facility] and physician being in [place] . Lacks knowledge of transportation benefit offered by health plan  Clinical Social Work Clinical Goal(s):  [name] Over the next 30 days patient will become more knowledgeable of health plan benefit  CCM SW Interventions: Completed 08/01/2019 with the patient and her daughter  [name] . Outbound call to assess progression of patient stated goal . Determined [name] has been successful in accessing the patients transportation benefit under her health plan . Riesa Pope to contact CM SW with future transportation barriers . Goal Met  Patient Self Care Activities:  . Currently UNABLE TO independently drive self to physician appointments  Please see past updates related to this goal by clicking on the "Past Updates" button in the selected goal                Follow Up Plan: SW will follow up with patient by phone over the next month.   [name], BSW, CDP Social Worker, Certified Dementia Practitioner [place] [facility] [place] [facility] [phone]

## 2019-08-01 NOTE — Telephone Encounter (Signed)
Daughter called requesting to change from Dr. Lindi Adie to Dr. Benay Spice, since Dr. Benay Spice has taken care of a family member in the past and and they were pleased with the care. Patient moved to [place] and has returned to [place] and needs to re-establish. Currently on anastrazole. Asking to have her seen in the next month if possible since she is having some breast changes. Informed daughter that Dr. Benay Spice will be made aware of their request and that we also need to let Dr. Lindi Adie know of request to change practioners.

## 2019-08-01 NOTE — Telephone Encounter (Signed)
Per pt request, October calendar printed and mailed to address on file

## 2019-08-02 ENCOUNTER — Ambulatory Visit (HOSPITAL_BASED_OUTPATIENT_CLINIC_OR_DEPARTMENT_OTHER): Payer: Medicare Other | Attending: Physician Assistant | Admitting: Cardiology

## 2019-08-02 ENCOUNTER — Telehealth: Payer: Self-pay | Admitting: *Deleted

## 2019-08-02 ENCOUNTER — Other Ambulatory Visit: Payer: Self-pay

## 2019-08-02 VITALS — Temp 97.0°F | Ht 65.0 in | Wt 187.0 lb

## 2019-08-02 DIAGNOSIS — G4733 Obstructive sleep apnea (adult) (pediatric): Secondary | ICD-10-CM

## 2019-08-02 DIAGNOSIS — I50810 Right heart failure, unspecified: Secondary | ICD-10-CM | POA: Diagnosis not present

## 2019-08-02 DIAGNOSIS — R0902 Hypoxemia: Secondary | ICD-10-CM | POA: Diagnosis not present

## 2019-08-02 DIAGNOSIS — Z79899 Other long term (current) drug therapy: Secondary | ICD-10-CM | POA: Diagnosis not present

## 2019-08-02 DIAGNOSIS — Z794 Long term (current) use of insulin: Secondary | ICD-10-CM | POA: Insufficient documentation

## 2019-08-02 NOTE — Telephone Encounter (Signed)
Per Dr. Benay Spice: Needs referral to surgery, Dr. Donne Hazel or Dr. Lucia Gaskins. Recommends she stays with Dr. Lindi Adie, our breast cancer specialist. IF they still want to see Dr. Benay Spice, he can see her on 10/28 at 1130 for 60 minutes. Notified daughter of above and informed her that although Dr.Sherrill is able to manage breast cancer, Dr. Lindi Adie is the specialist in this site and has the back up of breast navigators and support services in her care. She will discuss this with patient and family and call back with decision next week.

## 2019-08-04 NOTE — Procedures (Signed)
° °  Patient Name: [name], [name] Date: 08/02/2019 Gender: Female D.O.B: January 03, 1933 Age (years): 31 Referring Provider: Melina Copa Height (inches): 65 Interpreting Physician: Fransico Him MD, ABSM Weight (lbs): 187 RPSGT: Zadie Rhine BMI: 31 MRN: [id number] Neck Size: 15.00  CLINICAL INFORMATION The patient is referred for a split night study with BPAP.  MEDICATIONS Medications self-administered by patient taken the night of the study : FAMOTIDINE, LEVEMIR FLEXTOUCH, LIPITOR, LYRICA  SLEEP STUDY TECHNIQUE As per the AASM Manual for the Scoring of Sleep and Associated Events v2.3 (April 2016) with a hypopnea requiring 4% desaturations.  The channels recorded and monitored were frontal, central and occipital EEG, electrooculogram (EOG), submentalis EMG (chin), nasal and oral airflow, thoracic and abdominal wall motion, anterior tibialis EMG, snore microphone, electrocardiogram, and pulse oximetry. Bi-level positive airway pressure (BiPAP) was initiated when the patient met split night criteria and was titrated according to treat sleep-disordered breathing.  RESPIRATORY PARAMETERS Diagnostic Total AHI (/hr): 46.8  RDI (/hr):52.9  CA Index (/hr): 0.0 REM AHI (/hr): 40.6  NREM AHI (/hr):48.6  Supine AHI (/hr):46.8  Non-supine AHI (/hr):N/A Min O2 Sat (%):80.0  Mean O2 (%): 92.4  Time below 88% (min):7.2   Titration Optimal IPAP Pressure (cm): 17  Optimal EPAP Pressure (cm):13  AHI at Optimal Pressure (/hr):2  Min O2 at Optimal Pressure (%):93.0 Sleep % at Optimal (%):91  Supine % at Optimal (%):100   SLEEP ARCHITECTURE The study was initiated at 10:32:34 PM and terminated at 5:12:50 AM. The total recorded time was 400.3 minutes. EEG confirmed total sleep time was 341 minutes yielding a sleep efficiency of 85.2%. Sleep onset after lights out was 49.9 minutes with a REM latency of 46.0 minutes. The patient spent 1.6% of the night in stage N1 sleep, 72.9% in stage N2 sleep,  0.0% in stage N3 and 25.5% in REM. Wake after sleep onset (WASO) was 9.3 minutes. The Arousal Index was 13.4/hour.  LEG MOVEMENT DATA The total Periodic Limb Movements of Sleep (PLMS) were 0. The PLMS index was 0.0 .  CARDIAC DATA The 2 lead EKG demonstrated sinus rhythm, pacemaker generated. The mean heart rate was 100.0 beats per minute. Other EKG findings include: None.  IMPRESSIONS - Severe obstructive sleep apnea occurred during the diagnostic portion of the study (AHI = 46.8 /hour). An optimal PAP pressure was selected for this patient ( 17/13 cm of water) - No significant central sleep apnea occurred during the diagnostic portion of the study (CAI = 0.0/hour). - Moderate oxygen desaturation was noted during the diagnostic portion of the study (Min O2 = 80.0%). - No snoring was audible during this study. - No cardiac abnormalities were noted during this study. - Clinically significant periodic limb movements of sleep did not occur during the study.  DIAGNOSIS - Obstructive Sleep Apnea (327.23 [G47.33 ICD-10]) - Nocturnal Hypoxemia  RECOMMENDATIONS - Trial of BiPAP therapy on 17/13 cm H2O with a Small size Fisher&Paykel Full Face Mask Simplus mask and heated humidification. - Avoid alcohol, sedatives and other CNS depressants that may worsen sleep apnea and disrupt normal sleep architecture. - Sleep hygiene should be reviewed to assess factors that may improve sleep quality. - Weight management and regular exercise should be initiated or continued.  [Electronically signed] 08/04/2019 09:34 PM  Fransico Him MD, ABSM Diplomate, American Board of Sleep Medicine

## 2019-08-05 ENCOUNTER — Other Ambulatory Visit: Payer: Self-pay

## 2019-08-05 ENCOUNTER — Encounter: Payer: Self-pay | Admitting: *Deleted

## 2019-08-05 ENCOUNTER — Encounter: Payer: Self-pay | Admitting: Podiatry

## 2019-08-05 ENCOUNTER — Ambulatory Visit: Payer: Medicare Other | Admitting: Podiatry

## 2019-08-05 VITALS — BP 125/63 | HR 65

## 2019-08-05 DIAGNOSIS — N184 Chronic kidney disease, stage 4 (severe): Secondary | ICD-10-CM | POA: Diagnosis not present

## 2019-08-05 DIAGNOSIS — Z794 Long term (current) use of insulin: Secondary | ICD-10-CM

## 2019-08-05 DIAGNOSIS — E1122 Type 2 diabetes mellitus with diabetic chronic kidney disease: Secondary | ICD-10-CM

## 2019-08-05 DIAGNOSIS — M79674 Pain in right toe(s): Secondary | ICD-10-CM | POA: Diagnosis not present

## 2019-08-05 DIAGNOSIS — M79675 Pain in left toe(s): Secondary | ICD-10-CM | POA: Diagnosis not present

## 2019-08-05 DIAGNOSIS — B351 Tinea unguium: Secondary | ICD-10-CM

## 2019-08-05 DIAGNOSIS — R6889 Other general symptoms and signs: Secondary | ICD-10-CM | POA: Diagnosis not present

## 2019-08-05 NOTE — Patient Instructions (Signed)
Diabetes Mellitus and Foot Care Foot care is an important part of your health, especially when you have diabetes. Diabetes may cause you to have problems because of poor blood flow (circulation) to your feet and legs, which can cause your skin to:  Become thinner and drier.  Break more easily.  Heal more slowly.  Peel and crack. You may also have nerve damage (neuropathy) in your legs and feet, causing decreased feeling in them. This means that you may not notice minor injuries to your feet that could lead to more serious problems. Noticing and addressing any potential problems early is the best way to prevent future foot problems. How to care for your feet Foot hygiene  Wash your feet daily with warm water and mild soap. Do not use hot water. Then, pat your feet and the areas between your toes until they are completely dry. Do not soak your feet as this can dry your skin.  Trim your toenails straight across. Do not dig under them or around the cuticle. File the edges of your nails with an emery board or nail file.  Apply a moisturizing lotion or petroleum jelly to the skin on your feet and to dry, brittle toenails. Use lotion that does not contain alcohol and is unscented. Do not apply lotion between your toes. Shoes and socks  Wear clean socks or stockings every day. Make sure they are not too tight. Do not wear knee-high stockings since they may decrease blood flow to your legs.  Wear shoes that fit properly and have enough cushioning. Always look in your shoes before you put them on to be sure there are no objects inside.  To break in new shoes, wear them for just a few hours a day. This prevents injuries on your feet. Wounds, scrapes, corns, and calluses  Check your feet daily for blisters, cuts, bruises, sores, and redness. If you cannot see the bottom of your feet, use a mirror or ask someone for help.  Do not cut corns or calluses or try to remove them with medicine.  If you  find a minor scrape, cut, or break in the skin on your feet, keep it and the skin around it clean and dry. You may clean these areas with mild soap and water. Do not clean the area with peroxide, alcohol, or iodine.  If you have a wound, scrape, corn, or callus on your foot, look at it several times a day to make sure it is healing and not infected. Check for: ? Redness, swelling, or pain. ? Fluid or blood. ? Warmth. ? Pus or a bad smell. General instructions  Do not cross your legs. This may decrease blood flow to your feet.  Do not use heating pads or hot water bottles on your feet. They may burn your skin. If you have lost feeling in your feet or legs, you may not know this is happening until it is too late.  Protect your feet from hot and cold by wearing shoes, such as at the beach or on hot pavement.  Schedule a complete foot exam at least once a year (annually) or more often if you have foot problems. If you have foot problems, report any cuts, sores, or bruises to your health care provider immediately. Contact a health care provider if:  You have a medical condition that increases your risk of infection and you have any cuts, sores, or bruises on your feet.  You have an injury that is not   healing.  You have redness on your legs or feet.  You feel burning or tingling in your legs or feet.  You have pain or cramps in your legs and feet.  Your legs or feet are numb.  Your feet always feel cold.  You have pain around a toenail. Get help right away if:  You have a wound, scrape, corn, or callus on your foot and: ? You have pain, swelling, or redness that gets worse. ? You have fluid or blood coming from the wound, scrape, corn, or callus. ? Your wound, scrape, corn, or callus feels warm to the touch. ? You have pus or a bad smell coming from the wound, scrape, corn, or callus. ? You have a fever. ? You have a red line going up your leg. Summary  Check your feet every day  for cuts, sores, red spots, swelling, and blisters.  Moisturize feet and legs daily.  Wear shoes that fit properly and have enough cushioning.  If you have foot problems, report any cuts, sores, or bruises to your health care provider immediately.  Schedule a complete foot exam at least once a year (annually) or more often if you have foot problems. This information is not intended to replace advice given to you by your health care provider. Make sure you discuss any questions you have with your health care provider. Document Released: 10/07/2000 Document Revised: 11/22/2017 Document Reviewed: 11/11/2016 Elsevier Patient Education  2020 [employer] Inc[employer]   Onychomycosis/Fungal Toenails  WHAT IS IT? An infection that lies within the keratin of your nail plate that is caused by a fungus.  WHY ME? Fungal infections affect all ages, sexes, races, and creeds.  There may be many factors that predispose you to a fungal infection such as age, coexisting medical conditions such as diabetes, or an autoimmune disease; stress, medications, fatigue, genetics, etc.  Bottom line: fungus thrives in a warm, moist environment and your shoes offer such a location.  IS IT CONTAGIOUS? Theoretically, yes.  You do not want to share shoes, nail clippers or files with someone who has fungal toenails.  Walking around barefoot in the same room or sleeping in the same bed is unlikely to transfer the organism.  It is important to realize, however, that fungus can spread easily from one nail to the next on the same foot.  HOW DO WE TREAT THIS?  There are several ways to treat this condition.  Treatment may depend on many factors such as age, medications, pregnancy, liver and kidney conditions, etc.  It is best to ask your doctor which options are available to you.  1. No treatment.   Unlike many other medical concerns, you can live with this condition.  However for many people this can be a painful condition and may lead to  ingrown toenails or a bacterial infection.  It is recommended that you keep the nails cut short to help reduce the amount of fungal nail. 2. Topical treatment.  These range from herbal remedies to prescription strength nail lacquers.  About 40-50% effective, topicals require twice daily application for approximately 9 to 12 months or until an entirely new nail has grown out.  The most effective topicals are medical grade medications available through physicians offices. 3. Oral antifungal medications.  With an 80-90% cure rate, the most common oral medication requires 3 to 4 months of therapy and stays in your system for a year as the new nail grows out.  Oral antifungal medications do require   blood work to make sure it is a safe drug for you.  A liver function panel will be performed prior to starting the medication and after the first month of treatment.  It is important to have the blood work performed to avoid any harmful side effects.  In general, this medication safe but blood work is required. 4. Laser Therapy.  This treatment is performed by applying a specialized laser to the affected nail plate.  This therapy is noninvasive, fast, and non-painful.  It is not covered by insurance and is therefore, out of pocket.  The results have been very good with a 80-95% cure rate.  The [facility] is the only practice in the area to offer this therapy. 5. Permanent Nail Avulsion.  Removing the entire nail so that a new nail will not grow back. 

## 2019-08-05 NOTE — Telephone Encounter (Signed)
-----   Message from Sueanne Margarita, MD sent at 08/04/2019  9:38 PM EDT ----- Please let patient know that they have significant sleep apnea and had successful PAP titration and will be set up with PAP unit.  Please let DME know that order is in EPIC.  Please set patient up for OV in 10 weeks

## 2019-08-06 ENCOUNTER — Ambulatory Visit: Payer: Self-pay

## 2019-08-06 ENCOUNTER — Telehealth: Payer: Self-pay

## 2019-08-06 ENCOUNTER — Telehealth: Payer: Self-pay | Admitting: *Deleted

## 2019-08-06 DIAGNOSIS — I130 Hypertensive heart and chronic kidney disease with heart failure and stage 1 through stage 4 chronic kidney disease, or unspecified chronic kidney disease: Secondary | ICD-10-CM

## 2019-08-06 DIAGNOSIS — E1122 Type 2 diabetes mellitus with diabetic chronic kidney disease: Secondary | ICD-10-CM

## 2019-08-06 DIAGNOSIS — Z794 Long term (current) use of insulin: Secondary | ICD-10-CM

## 2019-08-06 DIAGNOSIS — Z170 Estrogen receptor positive status [ER+]: Secondary | ICD-10-CM

## 2019-08-06 DIAGNOSIS — I5032 Chronic diastolic (congestive) heart failure: Secondary | ICD-10-CM

## 2019-08-06 DIAGNOSIS — C50311 Malignant neoplasm of lower-inner quadrant of right female breast: Secondary | ICD-10-CM

## 2019-08-06 NOTE — Chronic Care Management (AMB) (Signed)
  Chronic Care Management   Outreach Note  08/06/2019 Name: [name] MRN: [id number] DOB: August 25, 1933  Referred by: [name], MD Reason for referral : Chronic Care Management ([facility] RNCM Telephone Outreach )   An unsuccessful telephone outreach was attempted today. The patient was referred to the case management team by [name] MD for assistance with care management and care coordination.   Follow Up Plan: Telephone follow up appointment with care management team member scheduled for: 08/20/19  [name], RN, BSN, CCM Care Management Coordinator [facility]  Direct Phone: [phone]

## 2019-08-06 NOTE — Telephone Encounter (Signed)
Informed patient of sleep study results and patient understanding was verbalized. Patient understands her sleep study showed they have significant sleep apnea and had successful PAP titration and will be set up with PAP unit. Please let DME know that order is in EPIC. Please set patient up for OV in 10 weeks   Upon patient request DME selection is  [name]. Patient understands she will be contacted by [facility] to set up her cpap. Patient understands to call if [facility] does not contact her with new setup in a timely manner. Patient understands they will be called once confirmation has been received from [facility] that they have received their new machine to schedule 10 week follow up appointment.  [facility] notified of new cpap order  Please add to airview Patient was grateful for the call and thanked me.

## 2019-08-06 NOTE — Telephone Encounter (Signed)
-----   Message from Sueanne Margarita, MD sent at 08/04/2019  9:38 PM EDT ----- Please let patient know that they have significant sleep apnea and had successful PAP titration and will be set up with PAP unit.  Please let DME know that order is in EPIC.  Please set patient up for OV in 10 weeks

## 2019-08-06 NOTE — Telephone Encounter (Signed)
This encounter was created in error - please disregard.

## 2019-08-07 ENCOUNTER — Ambulatory Visit (INDEPENDENT_AMBULATORY_CARE_PROVIDER_SITE_OTHER): Payer: Medicare Other

## 2019-08-07 DIAGNOSIS — E1122 Type 2 diabetes mellitus with diabetic chronic kidney disease: Secondary | ICD-10-CM

## 2019-08-07 DIAGNOSIS — C50311 Malignant neoplasm of lower-inner quadrant of right female breast: Secondary | ICD-10-CM | POA: Diagnosis not present

## 2019-08-07 DIAGNOSIS — I951 Orthostatic hypotension: Secondary | ICD-10-CM | POA: Diagnosis not present

## 2019-08-07 DIAGNOSIS — N184 Chronic kidney disease, stage 4 (severe): Secondary | ICD-10-CM

## 2019-08-07 DIAGNOSIS — I5032 Chronic diastolic (congestive) heart failure: Secondary | ICD-10-CM | POA: Diagnosis not present

## 2019-08-07 DIAGNOSIS — Z170 Estrogen receptor positive status [ER+]: Secondary | ICD-10-CM

## 2019-08-07 DIAGNOSIS — R6889 Other general symptoms and signs: Secondary | ICD-10-CM | POA: Diagnosis not present

## 2019-08-07 DIAGNOSIS — I130 Hypertensive heart and chronic kidney disease with heart failure and stage 1 through stage 4 chronic kidney disease, or unspecified chronic kidney disease: Secondary | ICD-10-CM

## 2019-08-07 DIAGNOSIS — Z794 Long term (current) use of insulin: Secondary | ICD-10-CM

## 2019-08-07 DIAGNOSIS — N183 Chronic kidney disease, stage 3 unspecified: Secondary | ICD-10-CM | POA: Diagnosis not present

## 2019-08-07 DIAGNOSIS — I129 Hypertensive chronic kidney disease with stage 1 through stage 4 chronic kidney disease, or unspecified chronic kidney disease: Secondary | ICD-10-CM | POA: Diagnosis not present

## 2019-08-07 DIAGNOSIS — I509 Heart failure, unspecified: Secondary | ICD-10-CM | POA: Diagnosis not present

## 2019-08-07 NOTE — Progress Notes (Signed)
Patient Care Team: [name], MD as PCP - General Irish Lack, Charlann Lange, MD as PCP - Cardiology (Cardiology) Excell Seltzer, MD as Consulting Physician (General Surgery) Nicholas Lose, MD as Consulting Physician (Hematology and Oncology) Kyung Rudd, MD as Consulting Physician (Radiation Oncology) [name] as Social Worker Little, Claudette Stapler, RN as Case Manager Lavera Guise, [facility] (Pharmacist)  DIAGNOSIS:    ICD-10-CM   1. Malignant neoplasm of lower-inner quadrant of right breast of female, estrogen receptor positive ([place])  C50.311 MM DIAG BREAST TOMO BILATERAL   Z17.0     SUMMARY OF ONCOLOGIC HISTORY: Oncology History  Malignant neoplasm of lower-inner quadrant of right breast of female, estrogen receptor positive ([place])  11/19/2018 Initial Diagnosis   Palpable right breast mass with calcifications, mammogram revealed focal asymmetry lower right breast 5:00 measuring 4.6 cm, ultrasound revealed overall size of 5.7 cm ultrasound-guided biopsy revealed grade 2 IDC ER 90% PR 80% Ki-67 5%, HER-2 negative, T3N0 stage IIa clinical stage   11/28/2018 Cancer Staging   Staging form: Breast, AJCC 8th Edition - Clinical: Stage IIA (cT3, cN0, cM0, G2, ER+, PR+, HER2-) - Signed by Nicholas Lose, MD on 11/28/2018     CHIEF COMPLIANT: Follow-up of right breast cancer  INTERVAL HISTORY: [name] is a 85 y.o. with above-mentioned history of right breast cancer currently on neoadjuvant treatment with anastrozole. She presents to the clinic today for follow-up.  She moved to [place] and stayed with her granddaughter for several months.  She saw an oncologist at [facility] who felt that she is now amenable for surgery.  She did not undergo surgery and came back to [facility] to live with her daughter.  She does feel that she is tolerating anastrozole extremely well and that the tumor in the right breast is much smaller.  She also informed me that she had a pacemaker  implanted in the interim.  REVIEW OF SYSTEMS:   Constitutional: Uses a wheelchair for ambulation. Eyes: Denies blurriness of vision Ears, nose, mouth, throat, and face: Denies mucositis or sore throat Respiratory: Denies cough, dyspnea or wheezes Cardiovascular: Denies palpitation, chest discomfort Gastrointestinal: Denies nausea, heartburn or change in bowel habits Skin: Denies abnormal skin rashes Lymphatics: Denies new lymphadenopathy or easy bruising Neurological: Denies numbness, tingling or new weaknesses Behavioral/Psych: Mood is stable, no new changes  Extremities: No lower extremity edema Breast: Right breast tumor is smaller All other systems were reviewed with the patient and are negative.  I have reviewed the past medical history, past surgical history, social history and family history with the patient and they are unchanged from previous note.  ALLERGIES:  is allergic to aspirin and penicillins.  MEDICATIONS:  Current Outpatient Medications  Medication Sig Dispense Refill   acetaminophen (TYLENOL) 500 MG tablet Take 1,000 mg by mouth every 6 (six) hours as needed for mild pain, moderate pain or headache.      amiodarone (PACERONE) 200 MG tablet Take 1 tablet (200 mg total) by mouth daily. 90 tablet 2   anastrozole (ARIMIDEX) 1 MG tablet Take 1 tablet (1 mg total) by mouth daily. 90 tablet 3   apixaban (ELIQUIS) 2.5 MG TABS tablet Take 1 tablet (2.5 mg total) by mouth 2 (two) times daily. 60 tablet 11   atorvastatin (LIPITOR) 20 MG tablet Take 1 tablet (20 mg total) by mouth at bedtime. 90 tablet 2   BD PEN NEEDLE NANO U/F 32G X 4 MM MISC Use as directed 150 each 2   calcium  carbonate (OS-CAL) 600 MG TABS tablet Take by mouth.     carvedilol (COREG) 12.5 MG tablet Take 12.5 mg by mouth 2 (two) times daily with a meal.     diclofenac sodium (VOLTAREN) 1 % GEL Apply 2 g topically 4 (four) times daily. 100 g 1   doxycycline (VIBRAMYCIN) 100 MG capsule Take 1  capsule (100 mg total) by mouth 2 (two) times daily. 14 capsule 0   ferrous sulfate 325 (65 FE) MG tablet Take 325 mg by mouth daily with breakfast.     glucose blood (ACCU-CHEK GUIDE) test strip Use as instructed to check blood sugars 3 times per day prn dx: e11.22 400 each 3   HUMALOG KWIKPEN 200 UNIT/ML SOPN Inject 4-8 Units into the skin 3 (three) times daily. Per sliding scale 15 pen 1   isosorbide-hydrALAZINE (BIDIL) 20-37.5 MG tablet Take 1 tablet by mouth 3 (three) times daily.      LEVEMIR FLEXTOUCH 100 UNIT/ML Pen Inject 10-16 Units into the skin daily. 16 units in the am and 10 units in the evening (Patient taking differently: Inject 8-16 Units into the skin daily. 16 units in the am and 8 units in the evening) 15 mL 2   magnesium oxide (MAG-OX) 400 MG tablet Take 400 mg by mouth daily.     omeprazole (PRILOSEC) 40 MG capsule TAKE ONE CAPSULE BY MOUTH DAILY BEFORE A MEAL 90 capsule 1   oxybutynin (DITROPAN) 5 MG tablet Take 1 tablet (5 mg total) by mouth 2 (two) times daily. 180 tablet 1   potassium chloride SA (K-DUR) 20 MEQ tablet Take 2 tablets (40 mEq total) by mouth daily. 180 tablet 1   pregabalin (LYRICA) 75 MG capsule Take 1 capsule (75 mg total) by mouth at bedtime. 90 capsule 2   torsemide (DEMADEX) 20 MG tablet Take 2 tablets by mouth twice a day X's 3 days then take 2 tablets by mouth in the a.m. and 1 tablet by mouth in the p.m 102 tablet 3   traMADol (ULTRAM) 50 MG tablet Take 50 mg by mouth 2 (two) times daily as needed for pain.     No current facility-administered medications for this visit.     PHYSICAL EXAMINATION: ECOG PERFORMANCE STATUS: 1 - Symptomatic but completely ambulatory  Vitals:   08/08/19 1554  BP: 135/65  Pulse: 63  Resp: 16  Temp: 99.1 F (37.3 C)  SpO2: 100%   Filed Weights   08/08/19 1554  Weight: 178 lb 8 oz (81 kg)    GENERAL: alert, no distress and comfortable SKIN: skin color, texture, turgor are normal, no rashes or  significant lesions EYES: normal, Conjunctiva are pink and non-injected, sclera clear OROPHARYNX: no exudate, no erythema and lips, buccal mucosa, and tongue normal  NECK: supple, thyroid normal size, non-tender, without nodularity LYMPH: no palpable lymphadenopathy in the cervical, axillary or inguinal LUNGS: clear to auscultation and percussion with normal breathing effort HEART: regular rate & rhythm and no murmurs and no lower extremity edema ABDOMEN: abdomen soft, non-tender and normal bowel sounds MUSCULOSKELETAL: no cyanosis of digits and no clubbing  NEURO: alert & oriented x 3 with fluent speech, no focal motor/sensory deficits EXTREMITIES: No lower extremity edema Breast: Palpable lump in the right breast appears to be much smaller.  LABORATORY DATA:  I have reviewed the data as listed CMP Latest Ref Rng & Units 07/02/2019 06/28/2019 06/24/2019  Glucose 65 - 99 mg/dL 104(H) 289(H) 270(H)  BUN 8 - 27 mg/dL 33(H) 44(H)  45(H)  Creatinine 0.57 - 1.00 mg/dL 1.58(H) 1.87(H) 2.13(H)  Sodium 134 - 144 mmol/L 141 141 141  Potassium 3.5 - 5.2 mmol/L 4.0 4.5 4.3  Chloride 96 - 106 mmol/L 101 101 102  CO2 20 - 29 mmol/L 31(H) 26 24  Calcium 8.7 - 10.3 mg/dL 9.1 9.2 8.9  Total Protein 6.5 - 8.1 g/dL - - -  Total Bilirubin 0.3 - 1.2 mg/dL - - -  Alkaline Phos 38 - 126 U/L - - -  AST 15 - 41 U/L - - -  ALT 0 - 44 U/L - - -    Lab Results  Component Value Date   WBC 3.2 (L) 07/02/2019   HGB 10.0 (L) 07/02/2019   HCT 30.2 (L) 07/02/2019   MCV 91 07/02/2019   PLT 111 (L) 07/02/2019   NEUTROABS 1.2 (L) 06/08/2019    ASSESSMENT & PLAN:  Malignant neoplasm of lower-inner quadrant of right breast of female, estrogen receptor positive ([place]) 11/19/2018:Palpable right breast mass with calcifications, mammogram revealed focal asymmetry lower right breast 5:00 measuring 4.6 cm, ultrasound revealed overall size of 5.7 cm ultrasound-guided biopsy revealed grade 2 IDC ER 90% PR 80% Ki-67 5%, HER-2  negative, T3N0 stage IIa clinical stage  Treatment plan: 1.  Neoadjuvant antiestrogen therapy with anastrozole started 11/28/2018 2.  Patient went to [place] and has seen [facility] medical oncologist who felt that the tumor has shrunk in size and that she could be amenable to surgery. 3.  We will request Dr. Donne Hazel to see the patient to consider surgical options. 4.  I will follow her up after surgery to discuss pathology report.  She will likely need adjuvant radiation followed by antiestrogen therapy continuation with anastrozole.  Patient's family tells me that she was cleared by cardiology to undergo the lumpectomy.  She also tells me that she got a pacemaker implantation in the interim.  We will try to obtain her records from [facility] but in the interim I will obtain a mammogram for further assessment of her tumor.    Orders Placed This Encounter  Procedures   MM DIAG BREAST TOMO BILATERAL    Standing Status:   Future    Standing Expiration Date:   08/07/2020    Order Specific Question:   Reason for Exam (SYMPTOM  OR DIAGNOSIS REQUIRED)    Answer:   neo adjuvant hormonal therapy evaluation    Order Specific Question:   Preferred imaging location?    Answer:   [facility]   The patient has a good understanding of the overall plan. she agrees with it. she will call with any problems that may develop before the next visit here.  Nicholas Lose, MD 08/08/2019  Julious Oka Dorshimer am acting as scribe for Dr. Nicholas Lose.  I have reviewed the above documentation for accuracy and completeness, and I agree with the above.

## 2019-08-08 ENCOUNTER — Inpatient Hospital Stay: Payer: Medicare Other | Attending: Hematology and Oncology | Admitting: Hematology and Oncology

## 2019-08-08 ENCOUNTER — Other Ambulatory Visit: Payer: Self-pay

## 2019-08-08 VITALS — BP 135/65 | HR 63 | Temp 99.1°F | Resp 16 | Ht 65.0 in | Wt 178.5 lb

## 2019-08-08 DIAGNOSIS — S3993XA Unspecified injury of pelvis, initial encounter: Secondary | ICD-10-CM | POA: Diagnosis not present

## 2019-08-08 DIAGNOSIS — Z79899 Other long term (current) drug therapy: Secondary | ICD-10-CM | POA: Insufficient documentation

## 2019-08-08 DIAGNOSIS — I951 Orthostatic hypotension: Secondary | ICD-10-CM | POA: Diagnosis not present

## 2019-08-08 DIAGNOSIS — M7989 Other specified soft tissue disorders: Secondary | ICD-10-CM | POA: Diagnosis not present

## 2019-08-08 DIAGNOSIS — E1122 Type 2 diabetes mellitus with diabetic chronic kidney disease: Secondary | ICD-10-CM | POA: Diagnosis not present

## 2019-08-08 DIAGNOSIS — I130 Hypertensive heart and chronic kidney disease with heart failure and stage 1 through stage 4 chronic kidney disease, or unspecified chronic kidney disease: Secondary | ICD-10-CM | POA: Diagnosis not present

## 2019-08-08 DIAGNOSIS — S82891A Other fracture of right lower leg, initial encounter for closed fracture: Secondary | ICD-10-CM | POA: Diagnosis not present

## 2019-08-08 DIAGNOSIS — D72819 Decreased white blood cell count, unspecified: Secondary | ICD-10-CM | POA: Diagnosis not present

## 2019-08-08 DIAGNOSIS — R5381 Other malaise: Secondary | ICD-10-CM | POA: Diagnosis not present

## 2019-08-08 DIAGNOSIS — S199XXA Unspecified injury of neck, initial encounter: Secondary | ICD-10-CM | POA: Diagnosis not present

## 2019-08-08 DIAGNOSIS — Z794 Long term (current) use of insulin: Secondary | ICD-10-CM | POA: Diagnosis not present

## 2019-08-08 DIAGNOSIS — Z950 Presence of cardiac pacemaker: Secondary | ICD-10-CM | POA: Insufficient documentation

## 2019-08-08 DIAGNOSIS — D631 Anemia in chronic kidney disease: Secondary | ICD-10-CM | POA: Diagnosis not present

## 2019-08-08 DIAGNOSIS — I447 Left bundle-branch block, unspecified: Secondary | ICD-10-CM | POA: Diagnosis not present

## 2019-08-08 DIAGNOSIS — B9689 Other specified bacterial agents as the cause of diseases classified elsewhere: Secondary | ICD-10-CM | POA: Diagnosis not present

## 2019-08-08 DIAGNOSIS — I959 Hypotension, unspecified: Secondary | ICD-10-CM | POA: Diagnosis not present

## 2019-08-08 DIAGNOSIS — S3992XA Unspecified injury of lower back, initial encounter: Secondary | ICD-10-CM | POA: Diagnosis not present

## 2019-08-08 DIAGNOSIS — N390 Urinary tract infection, site not specified: Secondary | ICD-10-CM | POA: Diagnosis not present

## 2019-08-08 DIAGNOSIS — I82441 Acute embolism and thrombosis of right tibial vein: Secondary | ICD-10-CM | POA: Diagnosis not present

## 2019-08-08 DIAGNOSIS — N184 Chronic kidney disease, stage 4 (severe): Secondary | ICD-10-CM | POA: Diagnosis not present

## 2019-08-08 DIAGNOSIS — I110 Hypertensive heart disease with heart failure: Secondary | ICD-10-CM | POA: Diagnosis not present

## 2019-08-08 DIAGNOSIS — K5900 Constipation, unspecified: Secondary | ICD-10-CM | POA: Diagnosis not present

## 2019-08-08 DIAGNOSIS — Z8673 Personal history of transient ischemic attack (TIA), and cerebral infarction without residual deficits: Secondary | ICD-10-CM | POA: Diagnosis not present

## 2019-08-08 DIAGNOSIS — M79609 Pain in unspecified limb: Secondary | ICD-10-CM | POA: Diagnosis not present

## 2019-08-08 DIAGNOSIS — Z743 Need for continuous supervision: Secondary | ICD-10-CM | POA: Diagnosis not present

## 2019-08-08 DIAGNOSIS — R0789 Other chest pain: Secondary | ICD-10-CM | POA: Diagnosis not present

## 2019-08-08 DIAGNOSIS — R05 Cough: Secondary | ICD-10-CM | POA: Diagnosis not present

## 2019-08-08 DIAGNOSIS — I10 Essential (primary) hypertension: Secondary | ICD-10-CM | POA: Diagnosis not present

## 2019-08-08 DIAGNOSIS — Z9114 Patient's other noncompliance with medication regimen: Secondary | ICD-10-CM | POA: Diagnosis not present

## 2019-08-08 DIAGNOSIS — Z7901 Long term (current) use of anticoagulants: Secondary | ICD-10-CM | POA: Insufficient documentation

## 2019-08-08 DIAGNOSIS — Z86718 Personal history of other venous thrombosis and embolism: Secondary | ICD-10-CM | POA: Diagnosis not present

## 2019-08-08 DIAGNOSIS — R079 Chest pain, unspecified: Secondary | ICD-10-CM | POA: Diagnosis not present

## 2019-08-08 DIAGNOSIS — Z20828 Contact with and (suspected) exposure to other viral communicable diseases: Secondary | ICD-10-CM | POA: Diagnosis present

## 2019-08-08 DIAGNOSIS — Z79811 Long term (current) use of aromatase inhibitors: Secondary | ICD-10-CM | POA: Insufficient documentation

## 2019-08-08 DIAGNOSIS — Z170 Estrogen receptor positive status [ER+]: Secondary | ICD-10-CM | POA: Insufficient documentation

## 2019-08-08 DIAGNOSIS — R0602 Shortness of breath: Secondary | ICD-10-CM | POA: Diagnosis not present

## 2019-08-08 DIAGNOSIS — I82491 Acute embolism and thrombosis of other specified deep vein of right lower extremity: Secondary | ICD-10-CM | POA: Diagnosis not present

## 2019-08-08 DIAGNOSIS — W19XXXA Unspecified fall, initial encounter: Secondary | ICD-10-CM | POA: Diagnosis present

## 2019-08-08 DIAGNOSIS — E875 Hyperkalemia: Secondary | ICD-10-CM | POA: Diagnosis not present

## 2019-08-08 DIAGNOSIS — I82409 Acute embolism and thrombosis of unspecified deep veins of unspecified lower extremity: Secondary | ICD-10-CM | POA: Diagnosis not present

## 2019-08-08 DIAGNOSIS — C50311 Malignant neoplasm of lower-inner quadrant of right female breast: Secondary | ICD-10-CM | POA: Diagnosis not present

## 2019-08-08 DIAGNOSIS — G4733 Obstructive sleep apnea (adult) (pediatric): Secondary | ICD-10-CM | POA: Diagnosis not present

## 2019-08-08 DIAGNOSIS — M6281 Muscle weakness (generalized): Secondary | ICD-10-CM | POA: Diagnosis not present

## 2019-08-08 DIAGNOSIS — R279 Unspecified lack of coordination: Secondary | ICD-10-CM | POA: Diagnosis not present

## 2019-08-08 DIAGNOSIS — Z886 Allergy status to analgesic agent status: Secondary | ICD-10-CM | POA: Diagnosis not present

## 2019-08-08 DIAGNOSIS — Z880 Allergy status to penicillin: Secondary | ICD-10-CM | POA: Diagnosis not present

## 2019-08-08 DIAGNOSIS — I824Z1 Acute embolism and thrombosis of unspecified deep veins of right distal lower extremity: Secondary | ICD-10-CM | POA: Diagnosis not present

## 2019-08-08 DIAGNOSIS — R55 Syncope and collapse: Secondary | ICD-10-CM | POA: Diagnosis not present

## 2019-08-08 DIAGNOSIS — N1832 Chronic kidney disease, stage 3b: Secondary | ICD-10-CM | POA: Diagnosis not present

## 2019-08-08 DIAGNOSIS — R1312 Dysphagia, oropharyngeal phase: Secondary | ICD-10-CM | POA: Diagnosis not present

## 2019-08-08 DIAGNOSIS — S0990XA Unspecified injury of head, initial encounter: Secondary | ICD-10-CM | POA: Diagnosis not present

## 2019-08-08 DIAGNOSIS — Z86711 Personal history of pulmonary embolism: Secondary | ICD-10-CM | POA: Diagnosis not present

## 2019-08-08 DIAGNOSIS — R410 Disorientation, unspecified: Secondary | ICD-10-CM | POA: Diagnosis not present

## 2019-08-08 DIAGNOSIS — M1990 Unspecified osteoarthritis, unspecified site: Secondary | ICD-10-CM | POA: Diagnosis not present

## 2019-08-08 DIAGNOSIS — M545 Low back pain: Secondary | ICD-10-CM | POA: Diagnosis not present

## 2019-08-08 DIAGNOSIS — I5043 Acute on chronic combined systolic (congestive) and diastolic (congestive) heart failure: Secondary | ICD-10-CM | POA: Diagnosis not present

## 2019-08-08 DIAGNOSIS — N183 Chronic kidney disease, stage 3 unspecified: Secondary | ICD-10-CM | POA: Diagnosis not present

## 2019-08-08 DIAGNOSIS — R262 Difficulty in walking, not elsewhere classified: Secondary | ICD-10-CM | POA: Diagnosis not present

## 2019-08-08 NOTE — Assessment & Plan Note (Signed)
11/19/2018:Palpable right breast mass with calcifications, mammogram revealed focal asymmetry lower right breast 5:00 measuring 4.6 cm, ultrasound revealed overall size of 5.7 cm ultrasound-guided biopsy revealed grade 2 IDC ER 90% PR 80% Ki-67 5%, HER-2 negative, T3N0 stage IIa clinical stage  Treatment plan: 1.  Neoadjuvant antiestrogen therapy with anastrozole started 11/28/2018 2.  Patient went to [place] and has seen [facility] medical oncologist who felt that the tumor has shrunk in size and that she could be amenable to surgery. 3.  We will request Dr. Donne Hazel to see the patient to consider surgical options. 4.  I will follow her up after surgery to discuss pathology report.  She will likely need adjuvant radiation followed by antiestrogen therapy continuation with anastrozole.  Patient's family tells me that she was cleared by cardiology to undergo the lumpectomy.  She also tells me that she got a pacemaker implantation in the interim.  We will try to obtain her records from [facility] but in the interim I will obtain a mammogram for further assessment of her tumor.

## 2019-08-08 NOTE — Progress Notes (Signed)
Subjective: [name] presents today referred by [name], MD for diabetic foot evaluation.  Patient relates 14 year history of diabetes.  Patient denies any history of foot wounds.  Patient denies any history of tingling, burning, pins/needles sensations.  She does admit numbness b/l.   She has h/o fracture right ankle and is followed by Orthopedics for this. She is currently wearing an Aircast Stirrup ankle brace and in wheelchair.   Today, patient c/o of painful, discolored, thick toenails which interfere with daily activities.  Pain is aggravated when wearing enclosed shoe gear.   Past Medical History:  Diagnosis Date  . Abnormal echocardiogram    a. possible mass on echo 05/2019 on [facility] -  Per Dr. Francesca Oman note, " In the absence of fever or sign of sepsis I would not pursue further work-up at this point given the patient age and comorbidities.  I would consider TEE once any signs of infection."   . Anemia   . Arthritis   . Cancer ([place])   . Cataract   . Chronic combined systolic and diastolic CHF (congestive heart failure) ([place])    a. Previously diastolic but patient AVS from outside hospital listed systolic CHF and cardiomyopathy, records pending.  . CKD (chronic kidney disease), stage III   . Clotting disorder ([place])   . CVA (cerebral vascular accident) ([place])    residual right sided weakness and mild dysphagia  . Diabetes mellitus ([place])   . Dizziness and giddiness   . DVT (deep venous thrombosis) (H[facility])    a. on anticoagulation for this.  . Essential hypertension   . LBBB (left bundle branch block)   . Mitral regurgitation    a. Mod MR by echo 2014.  . Muscular deconditioning   . Obesity   . Pacemaker   . Pericardial effusion   . SVT (supraventricular tachycardia) ([place])    a. In 2013 she had an EPS with ablation for SVT which did not eliminate the SVT completely. She also had bradycardia which limited medication. She was placed on amiodarone by Dr. Lovena Le.  .  Thrombocytopenia ([place]) 11/21/2011    Patient Active Problem List   Diagnosis Date Noted  . Displaced fracture of lateral malleolus of right fibula, initial encounter for closed fracture 07/10/2019  . Pain in left shoulder 07/10/2019  . Acute CHF (congestive heart failure) ([place]) 06/09/2019  . Acute pulmonary edema (HCC)   . Personal history of breast cancer 04/22/2019  . Pressure injury of skin 01/04/2019  . Acute pancreatitis 01/02/2019  . Malignant neoplasm of lower-inner quadrant of right breast of female, estrogen receptor positive ([place]) 11/23/2018  . Hematoma of right iliopsoas muscle 11/06/2018  . Intramuscular hematoma 11/06/2018  . Hypertensive heart disease with heart failure ([place]) 09/05/2017  . Aphasia 08/31/2017  . Dysphagia 08/31/2017  . CKD (chronic kidney disease), stage III ([place])   . LBBB (left bundle branch block)   . Anemia   . HTN (hypertension)   . Obesity   . DVT (deep venous thrombosis) ([place])   . DM (diabetes mellitus) ([place]) 07/28/2015  . Fall 07/28/2015  . Warfarin-induced coagulopathy ([place]) 07/28/2015  . Acute on chronic diastolic CHF (congestive heart failure) ([place]) 03/19/2014  . Edema 08/06/2013  . SVT (supraventricular tachycardia) ([place]) 10/15/2012  . First degree AV block 10/15/2012  . Acute renal insufficiency 10/14/2012  . Dizziness and giddiness   . Thrombocytopenia ([place]) 11/21/2011    Past Surgical History:  Procedure Laterality Date  . EYE SURGERY    .  SUPRAVENTRICULAR TACHYCARDIA ABLATION N/A 10/11/2012   Procedure: SUPRAVENTRICULAR TACHYCARDIA ABLATION;  Surgeon: Evans Lance, MD;  Location: [facility] CATH LAB;  Service: Cardiovascular;  Laterality: N/A;    Current Outpatient Medications on File Prior to Visit  Medication Sig Dispense Refill  . acetaminophen (TYLENOL) 500 MG tablet Take 1,000 mg by mouth every 6 (six) hours as needed for mild pain, moderate pain or headache.     [name] amiodarone (PACERONE) 200 MG tablet Take 1 tablet (200 mg total)  by mouth daily. 90 tablet 2  . anastrozole (ARIMIDEX) 1 MG tablet Take 1 tablet (1 mg total) by mouth daily. 90 tablet 3  . apixaban (ELIQUIS) 2.5 MG TABS tablet Take 1 tablet (2.5 mg total) by mouth 2 (two) times daily. 60 tablet 11  . atorvastatin (LIPITOR) 20 MG tablet Take 1 tablet (20 mg total) by mouth at bedtime. 90 tablet 2  . BD PEN NEEDLE NANO U/F 32G X 4 MM MISC Use as directed 150 each 2  . calcium carbonate (OS-CAL) 600 MG TABS tablet Take by mouth.    . carvedilol (COREG) 12.5 MG tablet Take 12.5 mg by mouth 2 (two) times daily with a meal.    . diclofenac sodium (VOLTAREN) 1 % GEL Apply 2 g topically 4 (four) times daily. 100 g 1  . doxycycline (VIBRAMYCIN) 100 MG capsule Take 1 capsule (100 mg total) by mouth 2 (two) times daily. 14 capsule 0  . ferrous sulfate 325 (65 FE) MG tablet Take 325 mg by mouth daily with breakfast.    . glucose blood (ACCU-CHEK GUIDE) test strip Use as instructed to check blood sugars 3 times per day prn dx: e11.22 400 each 3  . HUMALOG KWIKPEN 200 UNIT/ML SOPN Inject 4-8 Units into the skin 3 (three) times daily. Per sliding scale 15 pen 1  . isosorbide-hydrALAZINE (BIDIL) 20-37.5 MG tablet Take 1 tablet by mouth 3 (three) times daily.     [name] LEVEMIR FLEXTOUCH 100 UNIT/ML Pen Inject 10-16 Units into the skin daily. 16 units in the am and 10 units in the evening (Patient taking differently: Inject 8-16 Units into the skin daily. 16 units in the am and 8 units in the evening) 15 mL 2  . magnesium oxide (MAG-OX) 400 MG tablet Take 400 mg by mouth daily.    [name] omeprazole (PRILOSEC) 40 MG capsule TAKE ONE CAPSULE BY MOUTH DAILY BEFORE A MEAL 90 capsule 1  . oxybutynin (DITROPAN) 5 MG tablet Take 1 tablet (5 mg total) by mouth 2 (two) times daily. 180 tablet 1  . potassium chloride SA (K-DUR) 20 MEQ tablet Take 2 tablets (40 mEq total) by mouth daily. 180 tablet 1  . pregabalin (LYRICA) 75 MG capsule Take 1 capsule (75 mg total) by mouth at bedtime. 90 capsule 2   . torsemide (DEMADEX) 20 MG tablet Take 2 tablets by mouth twice a day X's 3 days then take 2 tablets by mouth in the a.m. and 1 tablet by mouth in the p.m 102 tablet 3  . traMADol (ULTRAM) 50 MG tablet Take 50 mg by mouth 2 (two) times daily as needed for pain.     No current facility-administered medications on file prior to visit.      Allergies  Allergen Reactions  . Aspirin Itching  . Penicillins Itching and Rash    DID THE REACTION INVOLVE: Swelling of the face/tongue/throat, SOB, or low BP? y Sudden or severe rash/hives, skin peeling, or the inside of the mouth  or nose? n Did it require medical treatment? yes When did it last happen?more than 10 years If all above answers are "NO", may proceed with cephalosporin use.     Social History   Occupational History  . Occupation: retired  Tobacco Use  . Smoking status: Never Smoker  . Smokeless tobacco: Never Used  Substance and Sexual Activity  . Alcohol use: No  . Drug use: No  . Sexual activity: Not Currently    Family History  Problem Relation Age of Onset  . Stroke Mother   . Cancer Father        prostate  . Hypertension Daughter   . Heart attack Brother        x2  . Hypertension Brother   . Stroke Sister   . Hypertension Sister   . Breast cancer Maternal Aunt     Immunization History  Administered Date(s) Administered  . DTaP 12/13/2012  . Influenza, High Dose Seasonal PF 08/15/2018  . Pneumococcal-Unspecified 04/04/2016    Review of systems: Positive Findings in bold print.  Constitutional:  chills, fatigue, fever, sweats, weight change Communication: Optometrist, sign Ecologist, hand writing, iPad/Android device Head: headaches, head injury Eyes: changes in vision, eye pain, glaucoma, cataracts, macular degeneration, diplopia, glare,  light sensitivity, eyeglasses or contacts, blindness Ears nose mouth throat: hearing impaired, hearing aids,  ringing in ears, deaf, sign language,   vertigo, nosebleeds,  rhinitis,  cold sores, snoring, swollen glands Cardiovascular: HTN, edema, arrhythmia, pacemaker in place, defibrillator in place, chest pain/tightness, chronic anticoagulation, blood clot, heart failure, MI Peripheral Vascular: leg cramps, varicose veins, blood clots, lymphedema, varicosities Respiratory:  difficulty breathing, denies congestion, SOB, wheezing, cough, emphysema Gastrointestinal: change in appetite or weight, abdominal pain, constipation, diarrhea, nausea, vomiting, vomiting blood, change in bowel habits, abdominal pain, jaundice, rectal bleeding, hemorrhoids, GERD Genitourinary:  nocturia,  pain on urination, polyuria,  blood in urine, Foley catheter, urinary urgency, ESRD on hemodialysis Musculoskeletal: amputation, cramping, stiff joints, painful joints, decreased joint motion, fractures, OA, gout, hemiplegia, paraplegia, uses cane, wheelchair bound, uses walker, uses rollator Skin: +changes in toenails, color change, dryness, itching, mole changes,  rash, wound(s) Neurological: headaches, numbness in feet, paresthesias in feet, burning in feet, fainting,  seizures, change in speech,  headaches, memory problems/poor historian, cerebral palsy, weakness, paralysis, CVA, TIA Endocrine: diabetes, hypothyroidism, hyperthyroidism,  goiter, dry mouth, flushing, heat intolerance,  cold intolerance,  excessive thirst, denies polyuria,  nocturia Hematological:  easy bleeding, excessive bleeding, easy bruising, enlarged lymph nodes, on long term blood thinner, history of past transusions Allergy/immunological:  hives, eczema, frequent infections, multiple drug allergies, seasonal allergies, transplant recipient, multiple food allergies Psychiatric:  anxiety, depression, mood disorder, suicidal ideations, hallucinations, insomnia  Objective: Vitals:   08/05/19 1026  BP: 125/63  Pulse: 65   Vascular Examination: Capillary refill time <3 seconds x 10  digits  Dorsalis pedis pulses 2/4 b/l.  Posterior tibial pulses unable to palpate due to ankle edema left; cast right foot.  Digital hair absent x 10 digits  Skin temperature gradient WNL b/l.  Bilateral ankle edema.   Dermatological Examination: Skin with normal turgor, texture and tone b/l.  Toenails 1-5 b/l discolored, thick, dystrophic with subungual debris and pain with palpation to nailbeds due to thickness of nails.  Musculoskeletal: Muscle strength 5/5 to all LE muscle groups LLE; deferred right LE.  Severe genu valgum right LE.  HAV with bunion b/l.  Hammertoes 2-5 b/l.  Neurological: Sensation intact  5/5 b/l with 10 gram  monofilament.  Vibratory sensation deferred due to amount of edema b/l.   Assessment: 1. Painful onychomycosis toenails 1-5 b/l  2. NIDDM  Plan: 1. Discussed diabetic foot care principles. Literature dispensed on today. 2. Toenails 1-5 b/l were debrided in length and girth without iatrogenic bleeding. 3. Patient to continue soft, supportive shoe gear daily. 4. Patient to report any pedal injuries to medical professional immediately. 5. Follow up 3 months.  6. Patient/POA to call should there be a concern in the interim.

## 2019-08-09 NOTE — Chronic Care Management (AMB) (Signed)
Chronic Care Management   Follow Up Note   08/07/2019 Name: EVOLA HOLLIS MRN: [id number] DOB: May 23, 1933  Referred by: [name], MD Reason for referral : Chronic Care Management (CCM RNCM Telephone Outreach )   RHEAGAN [name] is a 85 y.o. year old female who is a primary care patient of [name], MD. The CCM team was consulted for assistance with chronic disease management and care coordination needs.    Review of patient status, including review of consultants reports, relevant laboratory and other test results, and collaboration with appropriate care team members and the patient's provider was performed as part of comprehensive patient evaluation and provision of chronic care management services.    SDOH (Social Determinants of Health) screening performed today: Housing . See Care Plan for related entries.   Advanced Directives Status: N See Care Plan and [facility] application for related entries.  I spoke with [name]'s daughter [name] by telephone today and her care plan was updated.   Outpatient Encounter Medications as of 08/07/2019  Medication Sig  . acetaminophen (TYLENOL) 500 MG tablet Take 1,000 mg by mouth every 6 (six) hours as needed for mild pain, moderate pain or headache.   [name] amiodarone (PACERONE) 200 MG tablet Take 1 tablet (200 mg total) by mouth daily.  [name] anastrozole (ARIMIDEX) 1 MG tablet Take 1 tablet (1 mg total) by mouth daily.  [name] apixaban (ELIQUIS) 2.5 MG TABS tablet Take 1 tablet (2.5 mg total) by mouth 2 (two) times daily.  [name] atorvastatin (LIPITOR) 20 MG tablet Take 1 tablet (20 mg total) by mouth at bedtime.  . BD PEN NEEDLE NANO U/F 32G X 4 MM MISC Use as directed  . calcium carbonate (OS-CAL) 600 MG TABS tablet Take by mouth.  . carvedilol (COREG) 12.5 MG tablet Take 12.5 mg by mouth 2 (two) times daily with a meal.  . diclofenac sodium (VOLTAREN) 1 % GEL Apply 2 g topically 4 (four) times daily.  [name] doxycycline (VIBRAMYCIN) 100 MG  capsule Take 1 capsule (100 mg total) by mouth 2 (two) times daily.  . ferrous sulfate 325 (65 FE) MG tablet Take 325 mg by mouth daily with breakfast.  . 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 HUMALOG KWIKPEN 200 UNIT/ML SOPN Inject 4-8 Units into the skin 3 (three) times daily. Per sliding scale  . isosorbide-hydrALAZINE (BIDIL) 20-37.5 MG tablet Take 1 tablet by mouth 3 (three) times daily.   [name] LEVEMIR FLEXTOUCH 100 UNIT/ML Pen Inject 10-16 Units into the skin daily. 16 units in the am and 10 units in the evening (Patient taking differently: Inject 8-16 Units into the skin daily. 16 units in the am and 8 units in the evening)  . magnesium oxide (MAG-OX) 400 MG tablet Take 400 mg by mouth daily.  [name] omeprazole (PRILOSEC) 40 MG capsule TAKE ONE CAPSULE BY MOUTH DAILY BEFORE A MEAL  . oxybutynin (DITROPAN) 5 MG tablet Take 1 tablet (5 mg total) by mouth 2 (two) times daily.  . potassium chloride SA (K-DUR) 20 MEQ tablet Take 2 tablets (40 mEq total) by mouth daily.  . pregabalin (LYRICA) 75 MG capsule Take 1 capsule (75 mg total) by mouth at bedtime.  . torsemide (DEMADEX) 20 MG tablet Take 2 tablets by mouth twice a day X's 3 days then take 2 tablets by mouth in the a.m. and 1 tablet by mouth in the p.m  .  traMADol (ULTRAM) 50 MG tablet Take 50 mg by mouth 2 (two) times daily as needed for pain.   No facility-administered encounter medications on file as of 08/07/2019.      Goals Addressed      Patient Stated   . "I am having pain in my ankle" (pt-stated)       Current Barriers:  [name] Knowledge Deficits related to diagnosis and treatment of right ankle pain and swelling  Nurse Case Manager Clinical Goal(s):  [name] Over the next 30 days, patient will verbalize understanding of plan for evaluation and treatment of right ankle  CCM RN CM Interventions:  08/07/19 call completed with patients daughter [name]  .  Received return voice message from daughter [name]; placed outbound call to dtr as requested . Evaluation of current treatment plan related to ankle pain and swelling and patient's adherence to plan as established by provider . Reinforced to daughter for patient to keep this extremity elevated while resting; use the pivot technique for transferring from W/C as directed by MD; discussed patient is wearing a soft boot and allowed partial weight with transfers, she is using her W/C at all times and her pain has improved . PT was placed on hold x 3 weeks but will resume next week in home . Discussed plans with patient for ongoing care management follow up and provided patient with direct contact information for care management team  Patient Self Care Activities:  . Self administers medications as prescribed . Attends all scheduled provider appointments . Calls pharmacy for medication refills . Performs ADL's independently . Performs IADL's independently . Calls provider office for new concerns or questions  Please see past updates related to this goal by clicking on the "Past Updates" button in the selected goal      . I think my sugar has been higher recently (pt-stated)       Current Barriers:  . Non Adherence to prescribed diabetes diet/regimen (most recent A1c 8.4)  Pharmacist Clinical Goal(s):  [name] Over the next 60 days, patient will demonstrate Improved medication/diet adherence as evidenced by decreased A1c/improved blood sugar readings. Goal Met . New - 08/07/19 Over the next 90 days, patient will work with the CCM team for ongoing education and instruction on how to [place] DM with use of DM resources and tools related to Meal planning, taking meds as directed and implementing activity/exercise into daily routine to help lower A1C < 7.0  CCM RN CM Interventions:  Completed on 08/07/19: completed call with daughter [name]   Reviewed patient's BG log; discussed  patient's BG have are stable with BG running in the 100's  Discussed most recent A1C is down to 7.9 to 8.4 with target A1C of 7.0 to help reduce risk for complications related to poorly controlled DM  Reviewed patient's prescribed insulin regimen and patient is self injecting, dtr states "mom isn't having to take as much insulin because her sugars have been better'  Confirmed patient received printed DM materials related to Meal Planning and using the Plate Method (pt is using a sectioned plate)  Discussed importance of continued monitoring of CBG's, recording CBG's and reporting abnormal readings to the CCM team, BG <70 and or >250  Discussed plans with patient for ongoing care management follow up and provided patient with direct contact information for care management team  Patient Self Care Activities: with granddaughter assistance . Self administers medications as prescribed . Attends all scheduled provider appointments . Calls pharmacy for  medication refills  Please see past updates related to this goal by clicking on the "Past Updates" button in the selected goal        Other   . "We want to learn about long term care options for placement"       Grand-daughter and daughter stated:  Current Barriers:  . Financial constraints . Level of care concerns . Lacks knowledge of how to apply for long term care Medicaid  Clinical Social Work Clinical Goal(s):  [name] Over the next 45 days, client will follow up with Social Services as directed by SW  Interventions: Completed 05/28/2019 . Outbound call to the patients daughter [name] to assess progression of patient stated goal . Communicated to [facility] the patients primary care provider felt ALF was an appropriate level of care for the patient . Discussed barriers for placement including COVID Pandemic as well as patients ability to afford placement . Determined the patient would like to move into a [facility] community on [address] in [place] which is located close to her daughters home . Outbound call to [facility] to determine admission process.  o [facility] is admitting patients from home and requiring a 14 day quarantine prior to mingling within the community. At this time there are no Medicaid beds available. Private pay room rate for a companion room would be $2,095 plus level of care . Outbound call to the patients daughter [name] to provide the above information . Riesa Pope that even if the patient is approved for Medicaid she may have difficulty finding an bed offer due to many communities having a set number of Medicaid beds they are licensed for . Mailed [name] a list of adults care homes obtained from [facility] . Collaboration with the patients primary provider to inform of the patients interest in ALF placement and needed FL-2 in the coming days to proceed with bed offer . Encouraged [name] to apply for Medicaid on behalf of the patient while researching placement options . Assessed for continued interest in [facility] . Determined the patient has yet to contact [facility] to discuss enrollment options . Riesa Pope [facility] only enrolls at the first of the month and to contact as soon as possible if this is an option she and the patient would like to explore  CCM RN CM Interventions: 08/07/19 call completed with patient's daughter [name]  . Discussed with daughter Ms. Lisbon has changed her mind about moving to an ALF . Discussed the patient has been placed on a waiting list for a low income 3 bedroom apartment and will have her granddaughter [name] and her 2 children move in with Ms. Koepp to provide 24/7 care . Discussed Ms. Noblett will need a letter from [name] stating Ms. B[place] will require a live in caretaker . Discussed Ms. Ryant and her family feel that this is a better long term care  plan for her and she feels better about this decision . Message sent to [name] with notification of patient's request . Message sent to [facility] regarding patient's long term care plan . Discussed plans with patient for ongoing care management follow up and provided patient with direct contact information for care management team   Patient Self Care Activities:  . Calls pharmacy for medication refills . Calls provider office for new concerns or questions`  Please see past updates related to this goal by  clicking on the "Past Updates" button in the selected goal      . .High Risk for Knowledge Deficit for CHF        Current Barriers:  [name] Knowledge Deficits related to disease process and Self Health management for CHF   Nurse Case Manager Clinical Goal(s):  [name] Over the next 60 days, patient will verbalize basic understanding of CHF disease process and self health management plan as evidenced by patient and caregiver will verbalize better understanding of the disease process for CHF and will verbalize increased understanding of MD recommendations for CHF disease management. 04/04/19 re-established goal date to 60 days due to treatment delays secondary to Corona virus Goal Met . New - 07/05/19 Over the next 60 days, patient will experience no IP or ED events secondary to exacerbation of CHF.  CCM RN CM Interventions:  08/07/19 completed call with patients daughter [name]   . Evaluation of current treatment plan related to CHF and patient's adherence to plan as established by provider. . Provided education to daughter re: importance of performing daily weights each am after voiding and wearing the smallest amount of clothing; discussed recording weights daily when patient able to resume full body weight . Discussed when to call the doctor for weight gain of 3 lbs in 1 day and or 5 lbs in 1 week . Discussed other s/s such as new or worsening fatigue, dry cough, shortness  of breath, swelling to face, hands, abdomen and lower extremities, nausea/vomiting with abdominal swelling   Discussed plans with patient for ongoing care management follow up and provided patient with direct contact information for care management team   Patient Self Care Activities:  . Attends all scheduled provider appointments . Calls pharmacy for medication refills . Calls provider office for new concerns or questions  Please see past updates related to this goal by clicking on the "Past Updates" button in the selected goal          Telephone follow up appointment with care management team member scheduled for: 08/20/19   [name], RN, BSN, CCM Care Management Coordinator [facility]  Direct Phone: [phone]

## 2019-08-09 NOTE — Patient Instructions (Addendum)
Visit Information  Goals Addressed      Patient Stated   . "I am having pain in my ankle" (pt-stated)       Current Barriers:  [name] Knowledge Deficits related to diagnosis and treatment of right ankle pain and swelling  Nurse Case Manager Clinical Goal(s):  [name] Over the next 30 days, patient will verbalize understanding of plan for evaluation and treatment of right ankle  CCM RN CM Interventions:  08/07/19 call completed with patients daughter [name]  . Received return voice message from daughter [name]; placed outbound call to dtr as requested . Evaluation of current treatment plan related to ankle pain and swelling and patient's adherence to plan as established by provider . Reinforced to daughter for patient to keep this extremity elevated while resting; use the pivot technique for transferring from W/C as directed by MD; discussed patient is wearing a soft boot and allowed partial weight with transfers, she is using her W/C at all times and her pain has improved . PT was placed on hold x 3 weeks but will resume next week in home . Discussed plans with patient for ongoing care management follow up and provided patient with direct contact information for care management team  Patient Self Care Activities:  . Self administers medications as prescribed . Attends all scheduled provider appointments . Calls pharmacy for medication refills . Performs ADL's independently . Performs IADL's independently . Calls provider office for new concerns or questions  Please see past updates related to this goal by clicking on the "Past Updates" button in the selected goal      . I think my sugar has been higher recently (pt-stated)       Current Barriers:  . Non Adherence to prescribed diabetes diet/regimen (most recent A1c 8.4)  Pharmacist Clinical Goal(s):  [name] Over the next 60 days, patient will demonstrate Improved medication/diet adherence as evidenced by decreased A1c/improved  blood sugar readings. Goal Met . New - 08/07/19 Over the next 90 days, patient will work with the CCM team for ongoing education and instruction on how to [place] DM with use of DM resources and tools related to Meal planning, taking meds as directed and implementing activity/exercise into daily routine to help lower A1C < 7.0  CCM RN CM Interventions:  Completed on 08/07/19: completed call with daughter [name]   Reviewed patient's BG log; discussed patient's BG have are stable with BG running in the 100's  Discussed most recent A1C is down to 7.9 to 8.4 with target A1C of 7.0 to help reduce risk for complications related to poorly controlled DM  Reviewed patient's prescribed insulin regimen and patient is self injecting, dtr states "mom isn't having to take as much insulin because her sugars have been better'  Confirmed patient received printed DM materials related to Meal Planning and using the Plate Method (pt is using a sectioned plate)  Discussed importance of continued monitoring of CBG's, recording CBG's and reporting abnormal readings to the CCM team, BG <70 and or >250  Discussed plans with patient for ongoing care management follow up and provided patient with direct contact information for care management team  Patient Self Care Activities: with granddaughter assistance . Self administers medications as prescribed . Attends all scheduled provider appointments . Calls pharmacy for medication refills  Please see past updates related to this goal by clicking on the "Past Updates" button in the selected goal        Other   . "  We want to learn about long term care options for placement"       Grand-daughter and daughter stated:  Current Barriers:  . Financial constraints . Level of care concerns . Lacks knowledge of how to apply for long term care Medicaid  Clinical Social Work Clinical Goal(s):  [name] Over the next 45 days, client will follow up with Social  Services as directed by SW  Interventions: Completed 05/28/2019 . Outbound call to the patients daughter [name] to assess progression of patient stated goal . Communicated to Tea the patients primary care provider felt ALF was an appropriate level of care for the patient . Discussed barriers for placement including COVID Pandemic as well as patients ability to afford placement . Determined the patient would like to move into a [facility] community on [address] in [place] which is located close to her daughters home . Outbound call to [facility] to determine admission process.  o [facility] is admitting patients from home and requiring a 14 day quarantine prior to mingling within the community. At this time there are no Medicaid beds available. Private pay room rate for a companion room would be $2,095 plus level of care . Outbound call to the patients daughter [name] to provide the above information . Riesa Pope that even if the patient is approved for Medicaid she may have difficulty finding an bed offer due to many communities having a set number of Medicaid beds they are licensed for . Mailed [name] a list of adults care homes obtained from [facility] within [facility] . Collaboration with the patients primary provider to inform of the patients interest in ALF placement and needed FL-2 in the coming days to proceed with bed offer . Encouraged [name] to apply for Medicaid on behalf of the patient while researching placement options . Assessed for continued interest in [facility] . Determined the patient has yet to contact [facility] to discuss enrollment options . Riesa Pope [facility] only enrolls at the first of the month and to contact as soon as possible if this is an option she and the patient would like to explore  CCM RN CM Interventions: 08/07/19 call completed with patient's daughter [name]  . Discussed with daughter Ms. Compere has changed her mind about moving to an ALF . Discussed the patient has been placed on a waiting list for a low income 3 bedroom apartment and will have her granddaughter [name] and her 2 children move in with Ms. Balla to provide 24/7 care . Discussed Ms. Monaco will need a letter from [name] stating Ms. Hjort will require a live in caretaker . Discussed Ms. Rhee and her family feel that this is a better long term care plan for her and she feels better about this decision . Message sent to [name] with notification of patient's request . Message sent to [facility] regarding patient's long term care plan . Discussed plans with patient for ongoing care management follow up and provided patient with direct contact information for care management team   Patient Self Care Activities:  . Calls pharmacy for medication refills . Calls provider office for new concerns or questions`  Please see past updates related to this goal by clicking on the "Past Updates" button in the selected goal      . .High Risk for Knowledge Deficit for CHF        Current Barriers:  .  Knowledge Deficits related to disease process and Self Health management for CHF   Nurse Case Manager Clinical Goal(s):  [name] Over the next 60 days, patient will verbalize basic understanding of CHF disease process and self health management plan as evidenced by patient and caregiver will verbalize better understanding of the disease process for CHF and will verbalize increased understanding of MD recommendations for CHF disease management. 04/04/19 re-established goal date to 60 days due to treatment delays secondary to Corona virus Goal Met . New - 07/05/19 Over the next 60 days, patient will experience no IP or ED events secondary to exacerbation of CHF.  CCM RN CM Interventions:  08/07/19 completed call with patients daughter [name]   . Evaluation of  current treatment plan related to CHF and patient's adherence to plan as established by provider. . Provided education to daughter re: importance of performing daily weights each am after voiding and wearing the smallest amount of clothing; discussed recording weights daily when patient able to resume full body weight . Discussed when to call the doctor for weight gain of 3 lbs in 1 day and or 5 lbs in 1 week . Discussed other s/s such as new or worsening fatigue, dry cough, shortness of breath, swelling to face, hands, abdomen and lower extremities, nausea/vomiting with abdominal swelling   Discussed plans with patient for ongoing care management follow up and provided patient with direct contact information for care management team   Patient Self Care Activities:  . Attends all scheduled provider appointments . Calls pharmacy for medication refills . Calls provider office for new concerns or questions  Please see past updates related to this goal by clicking on the "Past Updates" button in the selected goal          The patient verbalized understanding of instructions provided today and declined a print copy of patient instruction materials.   Telephone follow up appointment with care management team member scheduled for:08/20/19  [name], RN, BSN, CCM Care Management Coordinator [facility]  Direct Phone: [phone]

## 2019-08-10 ENCOUNTER — Encounter (HOSPITAL_COMMUNITY): Payer: Self-pay | Admitting: Emergency Medicine

## 2019-08-10 ENCOUNTER — Emergency Department (HOSPITAL_COMMUNITY): Payer: Medicare Other

## 2019-08-10 ENCOUNTER — Observation Stay (HOSPITAL_COMMUNITY): Payer: Medicare Other

## 2019-08-10 ENCOUNTER — Inpatient Hospital Stay (HOSPITAL_COMMUNITY)
Admission: EM | Admit: 2019-08-10 | Discharge: 2019-08-22 | DRG: 299 | Disposition: A | Discharge status: Skilled Nursing Facility | Payer: Medicare Other | Attending: Family Medicine | Admitting: Family Medicine

## 2019-08-10 ENCOUNTER — Other Ambulatory Visit: Payer: Self-pay

## 2019-08-10 DIAGNOSIS — I340 Nonrheumatic mitral (valve) insufficiency: Secondary | ICD-10-CM | POA: Diagnosis present

## 2019-08-10 DIAGNOSIS — Z86711 Personal history of pulmonary embolism: Secondary | ICD-10-CM

## 2019-08-10 DIAGNOSIS — W19XXXA Unspecified fall, initial encounter: Secondary | ICD-10-CM

## 2019-08-10 DIAGNOSIS — N1832 Chronic kidney disease, stage 3b: Secondary | ICD-10-CM | POA: Diagnosis present

## 2019-08-10 DIAGNOSIS — Z7901 Long term (current) use of anticoagulants: Secondary | ICD-10-CM

## 2019-08-10 DIAGNOSIS — M1990 Unspecified osteoarthritis, unspecified site: Secondary | ICD-10-CM | POA: Diagnosis present

## 2019-08-10 DIAGNOSIS — M7989 Other specified soft tissue disorders: Secondary | ICD-10-CM

## 2019-08-10 DIAGNOSIS — Z09 Encounter for follow-up examination after completed treatment for conditions other than malignant neoplasm: Secondary | ICD-10-CM

## 2019-08-10 DIAGNOSIS — Z880 Allergy status to penicillin: Secondary | ICD-10-CM

## 2019-08-10 DIAGNOSIS — Z794 Long term (current) use of insulin: Secondary | ICD-10-CM

## 2019-08-10 DIAGNOSIS — C50311 Malignant neoplasm of lower-inner quadrant of right female breast: Secondary | ICD-10-CM | POA: Diagnosis present

## 2019-08-10 DIAGNOSIS — Z79811 Long term (current) use of aromatase inhibitors: Secondary | ICD-10-CM

## 2019-08-10 DIAGNOSIS — R55 Syncope and collapse: Secondary | ICD-10-CM | POA: Diagnosis not present

## 2019-08-10 DIAGNOSIS — Z9114 Patient's other noncompliance with medication regimen: Secondary | ICD-10-CM

## 2019-08-10 DIAGNOSIS — E876 Hypokalemia: Secondary | ICD-10-CM | POA: Diagnosis present

## 2019-08-10 DIAGNOSIS — N183 Chronic kidney disease, stage 3 unspecified: Secondary | ICD-10-CM | POA: Diagnosis not present

## 2019-08-10 DIAGNOSIS — Z79899 Other long term (current) drug therapy: Secondary | ICD-10-CM

## 2019-08-10 DIAGNOSIS — G4733 Obstructive sleep apnea (adult) (pediatric): Secondary | ICD-10-CM | POA: Diagnosis present

## 2019-08-10 DIAGNOSIS — I82409 Acute embolism and thrombosis of unspecified deep veins of unspecified lower extremity: Secondary | ICD-10-CM | POA: Diagnosis present

## 2019-08-10 DIAGNOSIS — Z950 Presence of cardiac pacemaker: Secondary | ICD-10-CM

## 2019-08-10 DIAGNOSIS — Z8249 Family history of ischemic heart disease and other diseases of the circulatory system: Secondary | ICD-10-CM

## 2019-08-10 DIAGNOSIS — N184 Chronic kidney disease, stage 4 (severe): Secondary | ICD-10-CM

## 2019-08-10 DIAGNOSIS — I4430 Unspecified atrioventricular block: Secondary | ICD-10-CM | POA: Diagnosis present

## 2019-08-10 DIAGNOSIS — Z8673 Personal history of transient ischemic attack (TIA), and cerebral infarction without residual deficits: Secondary | ICD-10-CM

## 2019-08-10 DIAGNOSIS — Z886 Allergy status to analgesic agent status: Secondary | ICD-10-CM

## 2019-08-10 DIAGNOSIS — E669 Obesity, unspecified: Secondary | ICD-10-CM | POA: Diagnosis present

## 2019-08-10 DIAGNOSIS — Z823 Family history of stroke: Secondary | ICD-10-CM

## 2019-08-10 DIAGNOSIS — Z170 Estrogen receptor positive status [ER+]: Secondary | ICD-10-CM

## 2019-08-10 DIAGNOSIS — E875 Hyperkalemia: Secondary | ICD-10-CM | POA: Diagnosis present

## 2019-08-10 DIAGNOSIS — I82441 Acute embolism and thrombosis of right tibial vein: Principal | ICD-10-CM | POA: Diagnosis present

## 2019-08-10 DIAGNOSIS — I824Z1 Acute embolism and thrombosis of unspecified deep veins of right distal lower extremity: Secondary | ICD-10-CM

## 2019-08-10 DIAGNOSIS — M79609 Pain in unspecified limb: Secondary | ICD-10-CM | POA: Diagnosis not present

## 2019-08-10 DIAGNOSIS — I82491 Acute embolism and thrombosis of other specified deep vein of right lower extremity: Secondary | ICD-10-CM

## 2019-08-10 DIAGNOSIS — R059 Cough, unspecified: Secondary | ICD-10-CM

## 2019-08-10 DIAGNOSIS — S82891A Other fracture of right lower leg, initial encounter for closed fracture: Secondary | ICD-10-CM | POA: Diagnosis present

## 2019-08-10 DIAGNOSIS — Z79891 Long term (current) use of opiate analgesic: Secondary | ICD-10-CM

## 2019-08-10 DIAGNOSIS — I447 Left bundle-branch block, unspecified: Secondary | ICD-10-CM | POA: Diagnosis present

## 2019-08-10 DIAGNOSIS — B9689 Other specified bacterial agents as the cause of diseases classified elsewhere: Secondary | ICD-10-CM | POA: Diagnosis present

## 2019-08-10 DIAGNOSIS — Z803 Family history of malignant neoplasm of breast: Secondary | ICD-10-CM

## 2019-08-10 DIAGNOSIS — D72819 Decreased white blood cell count, unspecified: Secondary | ICD-10-CM | POA: Diagnosis present

## 2019-08-10 DIAGNOSIS — I5043 Acute on chronic combined systolic (congestive) and diastolic (congestive) heart failure: Secondary | ICD-10-CM | POA: Diagnosis not present

## 2019-08-10 DIAGNOSIS — K5900 Constipation, unspecified: Secondary | ICD-10-CM | POA: Diagnosis present

## 2019-08-10 DIAGNOSIS — I130 Hypertensive heart and chronic kidney disease with heart failure and stage 1 through stage 4 chronic kidney disease, or unspecified chronic kidney disease: Secondary | ICD-10-CM | POA: Diagnosis present

## 2019-08-10 DIAGNOSIS — Z20828 Contact with and (suspected) exposure to other viral communicable diseases: Secondary | ICD-10-CM | POA: Diagnosis present

## 2019-08-10 DIAGNOSIS — L8990 Pressure ulcer of unspecified site, unspecified stage: Secondary | ICD-10-CM | POA: Diagnosis present

## 2019-08-10 DIAGNOSIS — E11649 Type 2 diabetes mellitus with hypoglycemia without coma: Secondary | ICD-10-CM | POA: Diagnosis present

## 2019-08-10 DIAGNOSIS — E119 Type 2 diabetes mellitus without complications: Secondary | ICD-10-CM

## 2019-08-10 DIAGNOSIS — D631 Anemia in chronic kidney disease: Secondary | ICD-10-CM | POA: Diagnosis present

## 2019-08-10 DIAGNOSIS — Z6829 Body mass index (BMI) 29.0-29.9, adult: Secondary | ICD-10-CM

## 2019-08-10 DIAGNOSIS — R05 Cough: Secondary | ICD-10-CM

## 2019-08-10 DIAGNOSIS — N390 Urinary tract infection, site not specified: Secondary | ICD-10-CM | POA: Diagnosis present

## 2019-08-10 DIAGNOSIS — E1122 Type 2 diabetes mellitus with diabetic chronic kidney disease: Secondary | ICD-10-CM | POA: Diagnosis not present

## 2019-08-10 DIAGNOSIS — Z86718 Personal history of other venous thrombosis and embolism: Secondary | ICD-10-CM

## 2019-08-10 DIAGNOSIS — I10 Essential (primary) hypertension: Secondary | ICD-10-CM | POA: Diagnosis present

## 2019-08-10 DIAGNOSIS — I951 Orthostatic hypotension: Secondary | ICD-10-CM | POA: Diagnosis present

## 2019-08-10 DIAGNOSIS — E1169 Type 2 diabetes mellitus with other specified complication: Secondary | ICD-10-CM

## 2019-08-10 LAB — HEPATIC FUNCTION PANEL
ALT: 41 U/L (ref 0–44)
AST: 38 U/L (ref 15–41)
Albumin: 3 g/dL — ABNORMAL LOW (ref 3.5–5.0)
Alkaline Phosphatase: 97 U/L (ref 38–126)
Bilirubin, Direct: <0.1 mg/dL (ref 0.0–0.2)
Total Bilirubin: 0.5 mg/dL (ref 0.3–1.2)
Total Protein: 6.9 g/dL (ref 6.5–8.1)

## 2019-08-10 LAB — URINALYSIS, ROUTINE W REFLEX MICROSCOPIC
Bilirubin Urine: NEGATIVE
Glucose, UA: NEGATIVE mg/dL
Hgb urine dipstick: NEGATIVE
Ketones, ur: NEGATIVE mg/dL
Nitrite: NEGATIVE
Protein, ur: NEGATIVE mg/dL
Specific Gravity, Urine: 1.008 (ref 1.005–1.030)
pH: 5 (ref 5.0–8.0)

## 2019-08-10 LAB — BASIC METABOLIC PANEL
Anion gap: 12 (ref 5–15)
BUN: 40 mg/dL — ABNORMAL HIGH (ref 8–23)
CO2: 26 mmol/L (ref 22–32)
Calcium: 8.7 mg/dL — ABNORMAL LOW (ref 8.9–10.3)
Chloride: 102 mmol/L (ref 98–111)
Creatinine, Ser: 1.94 mg/dL — ABNORMAL HIGH (ref 0.44–1.00)
GFR calc Af Amer: 27 mL/min — ABNORMAL LOW (ref >60)
GFR calc non Af Amer: 23 mL/min — ABNORMAL LOW (ref >60)
Glucose, Bld: 254 mg/dL — ABNORMAL HIGH (ref 70–99)
Potassium: 3.8 mmol/L (ref 3.5–5.1)
Sodium: 140 mmol/L (ref 135–145)

## 2019-08-10 LAB — SARS CORONAVIRUS 2 (TAT 6-24 HRS): SARS Coronavirus 2: NEGATIVE

## 2019-08-10 LAB — CBG MONITORING, ED
Glucose-Capillary: 309 mg/dL — ABNORMAL HIGH (ref 70–99)
Glucose-Capillary: 152 mg/dL — ABNORMAL HIGH (ref 70–99)

## 2019-08-10 LAB — BRAIN NATRIURETIC PEPTIDE: B Natriuretic Peptide: 138.5 pg/mL — ABNORMAL HIGH (ref 0.0–100.0)

## 2019-08-10 LAB — CBC
HCT: 29.6 % — ABNORMAL LOW (ref 36.0–46.0)
Hemoglobin: 9.5 g/dL — ABNORMAL LOW (ref 12.0–15.0)
MCH: 30.9 pg (ref 26.0–34.0)
MCHC: 32.1 g/dL (ref 30.0–36.0)
MCV: 96.4 fL (ref 80.0–100.0)
Platelets: 108 10*3/uL — ABNORMAL LOW (ref 150–400)
RBC: 3.07 MIL/uL — ABNORMAL LOW (ref 3.87–5.11)
RDW: 14.6 % (ref 11.5–15.5)
WBC: 3.4 10*3/uL — ABNORMAL LOW (ref 4.0–10.5)
nRBC: 0 % (ref 0.0–0.2)

## 2019-08-10 LAB — TROPONIN I (HIGH SENSITIVITY)
Troponin I (High Sensitivity): 42 ng/L — ABNORMAL HIGH (ref <18)
Troponin I (High Sensitivity): 39 ng/L — ABNORMAL HIGH (ref <18)
Troponin I (High Sensitivity): 37 ng/L — ABNORMAL HIGH (ref <18)
Troponin I (High Sensitivity): 46 ng/L — ABNORMAL HIGH (ref <18)

## 2019-08-10 LAB — D-DIMER, QUANTITATIVE: D-Dimer, Quant: 1.59 ug/mL-FEU — ABNORMAL HIGH (ref 0.00–0.50)

## 2019-08-10 LAB — PROTIME-INR
INR: 1.2 (ref 0.8–1.2)
Prothrombin Time: 14.9 seconds (ref 11.4–15.2)

## 2019-08-10 MED ORDER — PREGABALIN 75 MG PO CAPS
75.0000 mg | ORAL_CAPSULE | Freq: Every day | ORAL | Status: DC
  Administered 2019-08-10 – 2019-08-21 (×12): 75 mg via ORAL
  Filled 2019-08-10: qty 3
  Filled 2019-08-10 (×11): qty 1

## 2019-08-10 MED ORDER — ANASTROZOLE 1 MG PO TABS
1.0000 mg | ORAL_TABLET | Freq: Every day | ORAL | Status: DC
  Administered 2019-08-11 – 2019-08-22 (×12): 1 mg via ORAL
  Filled 2019-08-10 (×13): qty 1

## 2019-08-10 MED ORDER — TORSEMIDE 20 MG PO TABS
20.0000 mg | ORAL_TABLET | Freq: Every evening | ORAL | Status: DC
  Administered 2019-08-10 – 2019-08-17 (×8): 20 mg via ORAL
  Filled 2019-08-10 (×8): qty 1

## 2019-08-10 MED ORDER — PANTOPRAZOLE SODIUM 40 MG PO TBEC
80.0000 mg | DELAYED_RELEASE_TABLET | Freq: Every day | ORAL | Status: DC
  Administered 2019-08-11 – 2019-08-22 (×12): 80 mg via ORAL
  Filled 2019-08-10 (×11): qty 2

## 2019-08-10 MED ORDER — INSULIN ASPART 100 UNIT/ML ~~LOC~~ SOLN
3.0000 [IU] | Freq: Three times a day (TID) | SUBCUTANEOUS | Status: DC
  Administered 2019-08-11 – 2019-08-16 (×14): 3 [IU] via SUBCUTANEOUS

## 2019-08-10 MED ORDER — TECHNETIUM TC 99M DIETHYLENETRIAME-PENTAACETIC ACID
37.9000 | Freq: Once | INTRAVENOUS | Status: AC | PRN
  Administered 2019-08-10: 37.9 via INTRAVENOUS

## 2019-08-10 MED ORDER — SODIUM CHLORIDE 0.9% FLUSH
3.0000 mL | Freq: Two times a day (BID) | INTRAVENOUS | Status: DC
  Administered 2019-08-11 – 2019-08-21 (×19): 3 mL via INTRAVENOUS

## 2019-08-10 MED ORDER — AMIODARONE HCL 200 MG PO TABS
200.0000 mg | ORAL_TABLET | Freq: Every day | ORAL | Status: DC
  Administered 2019-08-11 – 2019-08-22 (×12): 200 mg via ORAL
  Filled 2019-08-10 (×12): qty 1

## 2019-08-10 MED ORDER — TRAMADOL HCL 50 MG PO TABS
50.0000 mg | ORAL_TABLET | Freq: Two times a day (BID) | ORAL | Status: DC | PRN
  Administered 2019-08-11: 50 mg via ORAL
  Filled 2019-08-10: qty 1

## 2019-08-10 MED ORDER — FERROUS SULFATE 325 (65 FE) MG PO TABS
325.0000 mg | ORAL_TABLET | Freq: Every day | ORAL | Status: DC
  Administered 2019-08-11 – 2019-08-22 (×12): 325 mg via ORAL
  Filled 2019-08-10 (×12): qty 1

## 2019-08-10 MED ORDER — INSULIN DETEMIR 100 UNIT/ML ~~LOC~~ SOLN
8.0000 [IU] | Freq: Every day | SUBCUTANEOUS | Status: DC
  Administered 2019-08-10 – 2019-08-15 (×6): 8 [IU] via SUBCUTANEOUS
  Filled 2019-08-10 (×7): qty 0.08

## 2019-08-10 MED ORDER — TECHNETIUM TO 99M ALBUMIN AGGREGATED
1.6500 | Freq: Once | INTRAVENOUS | Status: AC | PRN
  Administered 2019-08-10: 1.65 via INTRAVENOUS

## 2019-08-10 MED ORDER — MAGNESIUM OXIDE 400 MG PO TABS: 400.0000 mg | ORAL_TABLET | Freq: Every day | ORAL | Status: DC

## 2019-08-10 MED ORDER — OXYBUTYNIN CHLORIDE 5 MG PO TABS
5.0000 mg | ORAL_TABLET | Freq: Two times a day (BID) | ORAL | Status: DC
  Administered 2019-08-10 – 2019-08-22 (×24): 5 mg via ORAL
  Filled 2019-08-10 (×24): qty 1

## 2019-08-10 MED ORDER — PANTOPRAZOLE SODIUM 40 MG PO TBEC: 80.0000 mg | DELAYED_RELEASE_TABLET | Freq: Every day | ORAL | Status: DC

## 2019-08-10 MED ORDER — INSULIN ASPART 100 UNIT/ML ~~LOC~~ SOLN
0.0000 [IU] | Freq: Three times a day (TID) | SUBCUTANEOUS | Status: DC
  Administered 2019-08-11: 2 [IU] via SUBCUTANEOUS
  Administered 2019-08-11: 3 [IU] via SUBCUTANEOUS
  Administered 2019-08-12: 2 [IU] via SUBCUTANEOUS
  Administered 2019-08-12: 1 [IU] via SUBCUTANEOUS
  Administered 2019-08-13: 12:00:00 3 [IU] via SUBCUTANEOUS
  Administered 2019-08-13: 5 [IU] via SUBCUTANEOUS
  Administered 2019-08-14: 2 [IU] via SUBCUTANEOUS
  Administered 2019-08-14: 5 [IU] via SUBCUTANEOUS
  Administered 2019-08-15: 2 [IU] via SUBCUTANEOUS
  Administered 2019-08-15: 3 [IU] via SUBCUTANEOUS
  Administered 2019-08-16: 5 [IU] via SUBCUTANEOUS
  Administered 2019-08-16: 3 [IU] via SUBCUTANEOUS
  Administered 2019-08-17: 2 [IU] via SUBCUTANEOUS
  Administered 2019-08-17: 1 [IU] via SUBCUTANEOUS
  Administered 2019-08-17: 5 [IU] via SUBCUTANEOUS
  Administered 2019-08-18: 2 [IU] via SUBCUTANEOUS
  Administered 2019-08-18: 3 [IU] via SUBCUTANEOUS
  Administered 2019-08-18: 7 [IU] via SUBCUTANEOUS
  Administered 2019-08-19 – 2019-08-20 (×2): 3 [IU] via SUBCUTANEOUS
  Administered 2019-08-20: 2 [IU] via SUBCUTANEOUS
  Administered 2019-08-21: 3 [IU] via SUBCUTANEOUS
  Administered 2019-08-21: 2 [IU] via SUBCUTANEOUS
  Administered 2019-08-21: 1 [IU] via SUBCUTANEOUS
  Administered 2019-08-22 (×2): 2 [IU] via SUBCUTANEOUS

## 2019-08-10 MED ORDER — TORSEMIDE 20 MG PO TABS: 20.0000 mg | ORAL_TABLET | Freq: Every evening | ORAL | Status: DC

## 2019-08-10 MED ORDER — MAGNESIUM OXIDE 400 (241.3 MG) MG PO TABS
400.0000 mg | ORAL_TABLET | Freq: Every day | ORAL | Status: DC
  Administered 2019-08-11 – 2019-08-22 (×12): 400 mg via ORAL
  Filled 2019-08-10 (×12): qty 1

## 2019-08-10 MED ORDER — ACETAMINOPHEN 325 MG PO TABS
650.0000 mg | ORAL_TABLET | Freq: Four times a day (QID) | ORAL | Status: DC | PRN
  Administered 2019-08-19: 650 mg via ORAL
  Filled 2019-08-10: qty 2

## 2019-08-10 MED ORDER — HEPARIN (PORCINE) 25000 UT/250ML-% IV SOLN
1350.0000 [IU]/h | INTRAVENOUS | Status: DC
  Administered 2019-08-10: 1200 [IU]/h via INTRAVENOUS
  Administered 2019-08-11: 1300 [IU]/h via INTRAVENOUS
  Administered 2019-08-12 – 2019-08-14 (×4): 1450 [IU]/h via INTRAVENOUS
  Administered 2019-08-15: 1350 [IU]/h via INTRAVENOUS
  Filled 2019-08-10 (×6): qty 250

## 2019-08-10 MED ORDER — DICLOFENAC SODIUM 1 % TD GEL
2.0000 g | Freq: Every day | TRANSDERMAL | Status: DC
  Filled 2019-08-10: qty 100

## 2019-08-10 MED ORDER — INSULIN DETEMIR 100 UNIT/ML ~~LOC~~ SOLN
16.0000 [IU] | Freq: Every day | SUBCUTANEOUS | Status: DC
  Administered 2019-08-11 – 2019-08-16 (×6): 16 [IU] via SUBCUTANEOUS
  Filled 2019-08-10 (×6): qty 0.16

## 2019-08-10 MED ORDER — SODIUM CHLORIDE 0.9 % IV SOLN
Freq: Once | INTRAVENOUS | Status: AC
  Administered 2019-08-10: 19:00:00 via INTRAVENOUS

## 2019-08-10 MED ORDER — ONDANSETRON HCL 4 MG PO TABS: 4.0000 mg | ORAL_TABLET | Freq: Four times a day (QID) | ORAL | Status: DC | PRN

## 2019-08-10 MED ORDER — CALCIUM CARBONATE 1250 (500 CA) MG PO TABS
1250.0000 mg | ORAL_TABLET | Freq: Every day | ORAL | Status: DC
  Administered 2019-08-11 – 2019-08-22 (×12): 1250 mg via ORAL
  Filled 2019-08-10 (×13): qty 1

## 2019-08-10 MED ORDER — SODIUM CHLORIDE 0.9% FLUSH
3.0000 mL | Freq: Once | INTRAVENOUS | Status: AC
  Administered 2019-08-10: 3 mL via INTRAVENOUS

## 2019-08-10 MED ORDER — ACETAMINOPHEN 650 MG RE SUPP: 650.0000 mg | Freq: Four times a day (QID) | RECTAL | Status: DC | PRN

## 2019-08-10 MED ORDER — TORSEMIDE 20 MG PO TABS
40.0000 mg | ORAL_TABLET | Freq: Every day | ORAL | Status: DC
  Administered 2019-08-11 – 2019-08-12 (×2): 40 mg via ORAL
  Filled 2019-08-10 (×2): qty 2

## 2019-08-10 MED ORDER — POTASSIUM CHLORIDE CRYS ER 20 MEQ PO TBCR: 40.0000 meq | EXTENDED_RELEASE_TABLET | Freq: Every day | ORAL | Status: DC

## 2019-08-10 MED ORDER — ISOSORB DINITRATE-HYDRALAZINE 20-37.5 MG PO TABS
1.0000 | ORAL_TABLET | Freq: Three times a day (TID) | ORAL | Status: DC
  Administered 2019-08-10 – 2019-08-22 (×36): 1 via ORAL
  Filled 2019-08-10 (×36): qty 1

## 2019-08-10 MED ORDER — ATORVASTATIN CALCIUM 10 MG PO TABS
20.0000 mg | ORAL_TABLET | Freq: Every day | ORAL | Status: DC
  Administered 2019-08-10 – 2019-08-21 (×12): 20 mg via ORAL
  Filled 2019-08-10 (×12): qty 2

## 2019-08-10 MED ORDER — TORSEMIDE 20 MG PO TABS: 20.0000 mg | ORAL_TABLET | ORAL | Status: DC

## 2019-08-10 MED ORDER — CARVEDILOL 12.5 MG PO TABS
12.5000 mg | ORAL_TABLET | Freq: Two times a day (BID) | ORAL | Status: DC
  Administered 2019-08-11 – 2019-08-22 (×24): 12.5 mg via ORAL
  Filled 2019-08-10 (×24): qty 1

## 2019-08-10 MED ORDER — DICLOFENAC SODIUM 1 % TD GEL
2.0000 g | Freq: Three times a day (TID) | TRANSDERMAL | Status: DC | PRN
  Filled 2019-08-10: qty 100

## 2019-08-10 MED ORDER — ONDANSETRON HCL 4 MG/2ML IJ SOLN: 4.0000 mg | Freq: Four times a day (QID) | INTRAMUSCULAR | Status: DC | PRN

## 2019-08-10 MED ORDER — POTASSIUM CHLORIDE CRYS ER 20 MEQ PO TBCR
40.0000 meq | EXTENDED_RELEASE_TABLET | Freq: Every day | ORAL | Status: DC
  Administered 2019-08-11 – 2019-08-17 (×7): 40 meq via ORAL
  Filled 2019-08-10 (×7): qty 2

## 2019-08-10 NOTE — ED Notes (Signed)
Called dtr to get device type. Called number on card for facility. Now calling  [employer] and on hold now to get a rep. Rep is being paged for our area stat.

## 2019-08-10 NOTE — ED Notes (Signed)
Holly from [employer] at bedside.

## 2019-08-10 NOTE — ED Notes (Signed)
Patient transported to CT 

## 2019-08-10 NOTE — ED Notes (Signed)
[employer] rep called. Pt device reports each morning and was fine today. Stated she could send report. I gave her the information provided in the triage note and she agreed to come and interrogate the device. She is coming from [facility].

## 2019-08-10 NOTE — ED Provider Notes (Addendum)
[facility]ERGENCY DEPARTMENT Provider Note   CSN: [id number] Arrival date & time: 08/10/19  1429     History   Chief Complaint Chief Complaint  Patient presents with  . Loss of Consciousness  . Chest Pain    HPI [name] is a 85 y.o. female with a past medical history of CHF, CKD, CVA, DM, DVT on anticoagulation, left bundle branch block, hypertension, who presents today for evaluation of a syncopal event.  She also reports that her right ankle has been broken for the past 2 weeks.  She reports compliance with all of her medications.  She was reportedly eating when she felt her vision go blurry.  She called her daughter into the room who she states that she could hear talking to her but could not see.    Her daughter reports that her mother had just finished eating and she went to see her and her head was rolled back.  She states that she was unable to wake her.  She then put sugar on her finger and rubbed it inside her mother's mouth thinking that maybe the sugar was low.  She reportedly "came around a little and then went out again."  She gave her more sugar.  She states that she was fully out for approximately 2 to 3 minutes and during this time her face and lips turned to light gray. Daughter reports that patient has been complaining of blurred vision the past 2 mornings.  She reports that currently her vision is normal.  She did have chest pain in the ambulance.  She says that she has never had a similar chest pain however it has fully resolved at this point.  She states that this morning when she got up she felt generally weak like she was getting sick.  She reports that she has had increased cough over the past 3 days and that over the past day her sputum has become yellow and increased amount.  She denies any history of smoking.    She states that she fell last Wednesday/thursday from a mechanical fall.  She denies any syncope at that time.  She has  reportedly been complaining of tailbone pain since the phone.     HPI  Past Medical History:  Diagnosis Date  . Abnormal echocardiogram    a. possible mass on echo 05/2019 on [facility] -  Per Dr. Francesca Oman note, " In the absence of fever or sign of sepsis I would not pursue further work-up at this point given the patient age and comorbidities.  I would consider TEE once any signs of infection."   . Anemia   . Arthritis   . Cancer ([place])   . Cataract   . Chronic combined systolic and diastolic CHF (congestive heart failure) ([place])    a. Previously diastolic but patient AVS from outside hospital listed systolic CHF and cardiomyopathy, records pending.  . CKD (chronic kidney disease), stage III   . Clotting disorder ([place])   . CVA (cerebral vascular accident) ([place])    residual right sided weakness and mild dysphagia  . Diabetes mellitus ([place])   . Dizziness and giddiness   . DVT (deep venous thrombosis) (H[facility])    a. on anticoagulation for this.  . Essential hypertension   . LBBB (left bundle branch block)   . Mitral regurgitation    a. Mod MR by echo 2014.  . Muscular deconditioning   . Obesity   . Pacemaker   .  Pericardial effusion   . SVT (supraventricular tachycardia) ([place])    a. In 2013 she had an EPS with ablation for SVT which did not eliminate the SVT completely. She also had bradycardia which limited medication. She was placed on amiodarone by Dr. Lovena Le.  . Thrombocytopenia ([place]) 11/21/2011    Patient Active Problem List   Diagnosis Date Noted  . Displaced fracture of lateral malleolus of right fibula, initial encounter for closed fracture 07/10/2019  . Pain in left shoulder 07/10/2019  . Acute CHF (congestive heart failure) ([place]) 06/09/2019  . Acute pulmonary edema (HCC)   . Personal history of breast cancer 04/22/2019  . Pressure injury of skin 01/04/2019  . Acute pancreatitis 01/02/2019  . Malignant neoplasm of lower-inner quadrant of right breast of female, estrogen receptor  positive ([place]) 11/23/2018  . Hematoma of right iliopsoas muscle 11/06/2018  . Intramuscular hematoma 11/06/2018  . Hypertensive heart disease with heart failure ([place]) 09/05/2017  . Aphasia 08/31/2017  . Dysphagia 08/31/2017  . CKD (chronic kidney disease), stage III ([place])   . LBBB (left bundle branch block)   . Anemia   . HTN (hypertension)   . Obesity   . DVT (deep venous thrombosis) ([place])   . DM (diabetes mellitus) ([place]) 07/28/2015  . Fall 07/28/2015  . Warfarin-induced coagulopathy ([place]) 07/28/2015  . Acute on chronic diastolic CHF (congestive heart failure) ([place]) 03/19/2014  . Edema 08/06/2013  . SVT (supraventricular tachycardia) ([place]) 10/15/2012  . First degree AV block 10/15/2012  . Acute renal insufficiency 10/14/2012  . Dizziness and giddiness   . Thrombocytopenia ([place]) 11/21/2011    Past Surgical History:  Procedure Laterality Date  . EYE SURGERY    . SUPRAVENTRICULAR TACHYCARDIA ABLATION N/A 10/11/2012   Procedure: SUPRAVENTRICULAR TACHYCARDIA ABLATION;  Surgeon: Evans Lance, MD;  Location: [facility] C[facility] LAB;  Service: Cardiovascular;  Laterality: N/A;     OB History   No obstetric history on file.      Home Medications    Prior to Admission medications   Medication Sig Start Date End Date Taking? Authorizing Provider  acetaminophen (TYLENOL) 500 MG tablet Take 1,000 mg by mouth every 6 (six) hours as needed for mild pain, moderate pain or headache.    Yes [provider]  amiodarone (PACERONE) 200 MG tablet Take 1 tablet (200 mg total) by mouth daily. 07/08/19  Yes Jettie Booze, MD  anastrozole (ARIMIDEX) 1 MG tablet Take 1 tablet (1 mg total) by mouth daily. 11/28/18  Yes Nicholas Lose, MD  apixaban (ELIQUIS) 2.5 MG TABS tablet Take 1 tablet (2.5 mg total) by mouth 2 (two) times daily. 07/04/19  Yes Dunn, Dayna N, PA-C  atorvastatin (LIPITOR) 20 MG tablet Take 1 tablet (20 mg total) by mouth at bedtime. 06/14/19  Yes [name], MD  calcium  carbonate (OS-CAL) 600 MG TABS tablet Take 600 mg by mouth daily with breakfast.    Yes [provider]  carvedilol (COREG) 12.5 MG tablet Take 12.5 mg by mouth 2 (two) times daily with a meal.   Yes [provider]  Cholecalciferol (VITAMIN D-3 PO) Take 1 capsule by mouth daily after breakfast.   Yes [provider]  Cyanocobalamin (B-12 PO) Take 1 tablet by mouth daily after breakfast.   Yes [provider]  diclofenac sodium (VOLTAREN) 1 % GEL Apply 2 g topically 4 (four) times daily. Patient taking differently: Apply 2 g topically See admin instructions. Apply 2 grams to knees at bedtime and an additional 2  grams three times a day as needed for pain 10/30/18  Yes Rodriguez-Southworth, Sunday Spillers, PA-C  ferrous sulfate 325 (65 FE) MG tablet Take 325 mg by mouth daily with breakfast.   Yes [provider]  HUMALOG KWIKPEN 200 UNIT/ML SOPN Inject 4-8 Units into the skin 3 (three) times daily. Per sliding scale Patient taking differently: Inject 4-8 Units into the skin See admin instructions. Inject 4-8 units into the skin three times a day before meals, per sliding scale 02/04/19  Yes [name], MD  isosorbide-hydrALAZINE (BIDIL) 20-37.5 MG tablet Take 1 tablet by mouth 3 (three) times daily.    Yes [provider]  LEVEMIR FLEXTOUCH 100 UNIT/ML Pen Inject 10-16 Units into the skin daily. 16 units in the am and 10 units in the evening Patient taking differently: Inject 8-16 Units into the skin See admin instructions. Inject 16 units into the skin in the morning and 8 units at bedtime 05/29/19  Yes [name], MD  magnesium oxide (MAG-OX) 400 MG tablet Take 400 mg by mouth daily.   Yes [provider]  omeprazole (PRILOSEC) 40 MG capsule TAKE ONE CAPSULE BY MOUTH DAILY BEFORE A MEAL Patient taking differently: Take 40 mg by mouth 2 (two) times daily before a meal.  02/04/19  Yes [name], MD  oxybutynin (DITROPAN) 5 MG tablet Take 1  tablet (5 mg total) by mouth 2 (two) times daily. 05/13/19  Yes [name], MD  potassium chloride SA (K-DUR) 20 MEQ tablet Take 2 tablets (40 mEq total) by mouth daily. 03/07/19  Yes [name], MD  pregabalin (LYRICA) 75 MG capsule Take 1 capsule (75 mg total) by mouth at bedtime. 05/13/19  Yes [name], MD  torsemide (DEMADEX) 20 MG tablet Take 2 tablets by mouth twice a day X's 3 days then take 2 tablets by mouth in the a.m. and 1 tablet by mouth in the p.m Patient taking differently: Take 20-40 mg by mouth See admin instructions. Take 40 mg by mouth two times a day for 3 days, then take 40 mg in the morning and 20 mg in the evening for 4 days (then, repeat cycle) 07/04/19  Yes Dunn, Dayna N, PA-C  traMADol (ULTRAM) 50 MG tablet Take 50 mg by mouth 2 (two) times daily as needed for pain. 12/21/18  Yes [provider]  BD PEN NEEDLE NANO U/F 32G X 4 MM MISC Use as directed 02/07/19   [name], MD  doxycycline (VIBRAMYCIN) 100 MG capsule Take 1 capsule (100 mg total) by mouth 2 (two) times daily. Patient not taking: Reported on 08/10/2019 06/18/19   [name], MD  glucose blood (ACCU-CHEK GUIDE) test strip Use as instructed to check blood sugars 3 times per day prn dx: e11.22 03/28/19   [name], MD    Family History Family History  Problem Relation Age of Onset  . Stroke Mother   . Cancer Father        prostate  . Hypertension Daughter   . Heart attack Brother        x2  . Hypertension Brother   . Stroke Sister   . Hypertension Sister   . Breast cancer Maternal Aunt     Social History Social History   Tobacco Use  . Smoking status: Never Smoker  . Smokeless tobacco: Never Used  Substance Use Topics  . Alcohol use: No  . Drug use: No     Allergies   Aspirin and Penicillins   Review of Systems  Review of Systems  Constitutional: Positive for fatigue. Negative for chills and fever.  HENT: Negative for congestion, nosebleeds and postnasal  drip.   Eyes: Positive for visual disturbance (Resolved).  Respiratory: Positive for cough. Negative for shortness of breath.   Cardiovascular: Positive for chest pain. Negative for palpitations and leg swelling.  Gastrointestinal: Negative for abdominal pain, nausea and vomiting.  Genitourinary: Negative for dysuria and urgency.  Musculoskeletal: Negative for back pain and neck pain.  Skin: Negative for color change and rash.  Neurological: Positive for syncope and weakness (Generalized). Negative for seizures, facial asymmetry and speech difficulty.  Psychiatric/Behavioral: Negative for confusion.  All other systems reviewed and are negative.    Physical Exam Updated Vital Signs BP (!) 142/67   Pulse 63   Temp 98.4 F (36.9 C)   Resp 19   SpO2 100%   Physical Exam Vitals signs and nursing note reviewed.  Constitutional:      General: She is not in acute distress.    Appearance: She is well-developed.  HENT:     Head: Normocephalic and atraumatic.  Eyes:     Conjunctiva/sclera: Conjunctivae normal.  Neck:     Musculoskeletal: Neck supple.  Cardiovascular:     Rate and Rhythm: Normal rate and regular rhythm.     Pulses:          Dorsalis pedis pulses are 1+ on the right side and 1+ on the left side.     Heart sounds: Normal heart sounds. No murmur.     Comments: Physical exam limited by body habitus. Right lower leg is larger than left. Pulmonary:     Effort: Pulmonary effort is normal. No respiratory distress.     Breath sounds: Normal breath sounds. No decreased breath sounds, wheezing or rhonchi.     Comments: Frequent wet sounding cough Chest:     Chest wall: No tenderness.  Abdominal:     Palpations: Abdomen is soft.     Tenderness: There is no abdominal tenderness.  Musculoskeletal:     Comments: Right ankle is in an Aircast type splint.  Skin:    General: Skin is warm and dry.  Neurological:     General: No focal deficit present.     Mental Status: She  is alert.     Cranial Nerves: No cranial nerve deficit.      ED Treatments / Results  Labs (all labs ordered are listed, but only abnormal results are displayed) Labs Reviewed  BASIC METABOLIC PANEL - Abnormal; Notable for the following components:      Result Value   Glucose, Bld 254 (*)    BUN 40 (*)    Creatinine, Ser 1.94 (*)    Calcium 8.7 (*)    GFR calc non Af Amer 23 (*)    GFR calc Af Amer 27 (*)    All other components within normal limits  CBC - Abnormal; Notable for the following components:   WBC 3.4 (*)    RBC 3.07 (*)    Hemoglobin 9.5 (*)    HCT 29.6 (*)    Platelets 108 (*)    All other components within normal limits  URINALYSIS, ROUTINE W REFLEX MICROSCOPIC - Abnormal; Notable for the following components:   APPearance HAZY (*)    Leukocytes,Ua LARGE (*)    Bacteria, UA MANY (*)    All other components within normal limits  HEPATIC FUNCTION PANEL - Abnormal; Notable for the following components:   Albumin 3.0 (*)  All other components within normal limits  BRAIN NATRIURETIC PEPTIDE - Abnormal; Notable for the following components:   B Natriuretic Peptide 138.5 (*)    All other components within normal limits  D-DIMER, QUANTITATIVE (NOT AT [facility]) - Abnormal; Notable for the following components:   D-Dimer, Quant 1.59 (*)    All other components within normal limits  CBG MONITORING, ED - Abnormal; Notable for the following components:   Glucose-Capillary 309 (*)    All other components within normal limits  TROPONIN I (HIGH SENSITIVITY) - Abnormal; Notable for the following components:   Troponin I (High Sensitivity) 42 (*)    All other components within normal limits  TROPONIN I (HIGH SENSITIVITY) - Abnormal; Notable for the following components:   Troponin I (High Sensitivity) 39 (*)    All other components within normal limits  TROPONIN I (HIGH SENSITIVITY) - Abnormal; Notable for the following components:   Troponin I (High Sensitivity) 37 (*)     All other components within normal limits  URINE CULTURE  SARS CORONAVIRUS 2 (TAT 6-24 HRS)  PROTIME-INR  HEPARIN LEVEL (UNFRACTIONATED)  APTT  CBC  TROPONIN I (HIGH SENSITIVITY)    EKG EKG Interpretation  Date/Time:  Saturday August 10 2019 14:39:07 EDT Ventricular Rate:  61 PR Interval:    QRS Duration: 160 QT Interval:  522 QTC Calculation: 526 R Axis:   -72 Text Interpretation:  Sinus rhythm Short PR interval Left bundle branch block No signficiant changes from Aug 2020 ECG  Does not meet sgarbossa criteria, no STEMI  Confirmed by Octaviano Glow [phone]) on 08/10/2019 2:43:51 PM  EKG Interpretation  Date/Time:  Saturday August 10 2019 15:18:48 EDT Ventricular Rate:  65 PR Interval:    QRS Duration: 158 QT Interval:  510 QTC Calculation: 531 R Axis:   -74 Text Interpretation:  Sinus rhythm Left bundle branch block When compared to prior, no significant changes, appears paced.  No sTEMI Confirmed by Antony Blackbird [phone]) on 08/10/2019 3:51:13 PM        Radiology Dg Lumbar Spine Complete  Result Date: 08/10/2019 CLINICAL DATA:  85 year old female with fall and back pain. EXAM: LUMBAR SPINE - COMPLETE 4+ VIEW; PELVIS - 1-2 VIEW COMPARISON:  None. FINDINGS: There is no acute fracture or subluxation of the lumbar spine. There is osteopenia with multilevel degenerative changes. Grade 1 L4-L5 anterolisthesis. Multilevel disc desiccation and vacuum phenomena. No definite acute fracture of the pelvis identified. There is arthritic changes of the hips. The soft tissues are grossly unremarkable. Punctate radiopaque focus over the right renal silhouette may represent a tiny calculus. IMPRESSION: No acute/traumatic lumbar spine or pelvic pathology identified. Electronically Signed   By: Anner Crete M.D.   On: 08/10/2019 17:19   Dg Pelvis 1-2 Views  Result Date: 08/10/2019 CLINICAL DATA:  85 year old female with fall and back pain. EXAM: LUMBAR SPINE - COMPLETE 4+ VIEW;  PELVIS - 1-2 VIEW COMPARISON:  None. FINDINGS: There is no acute fracture or subluxation of the lumbar spine. There is osteopenia with multilevel degenerative changes. Grade 1 L4-L5 anterolisthesis. Multilevel disc desiccation and vacuum phenomena. No definite acute fracture of the pelvis identified. There is arthritic changes of the hips. The soft tissues are grossly unremarkable. Punctate radiopaque focus over the right renal silhouette may represent a tiny calculus. IMPRESSION: No acute/traumatic lumbar spine or pelvic pathology identified. Electronically Signed   By: Anner Crete M.D.   On: 08/10/2019 17:19   Ct Head Wo Contrast  Result Date: 08/10/2019 CLINICAL DATA:  Syncope, head trauma EXAM: CT HEAD WITHOUT CONTRAST TECHNIQUE: Contiguous axial images were obtained from the base of the skull through the vertex without intravenous contrast. COMPARISON:  07/13/2017, 11/06/2018 FINDINGS: Brain: No evidence of acute infarction, hemorrhage, hydrocephalus, extra-axial collection or mass lesion/mass effect. Mild cerebral volume loss, age-appropriate. Vascular: Mild atherosclerotic calcifications involving the large vessels of the skull base. No unexpected hyperdense vessel. Skull: Normal. Negative for fracture or focal lesion. Sinuses/Orbits: Scattered bilateral ethmoid air cell opacification. Remaining paranasal sinuses and mastoid air cells are clear. Orbital structures unremarkable. Other: None. IMPRESSION: 1. No acute intracranial findings. 2. Mild ethmoid sinus disease. Electronically Signed   By: Davina Poke M.D.   On: 08/10/2019 16:54   Ct Cervical Spine Wo Contrast  Result Date: 08/10/2019 CLINICAL DATA:  Fall, syncope EXAM: CT CERVICAL SPINE WITHOUT CONTRAST TECHNIQUE: Multidetector CT imaging of the cervical spine was performed without intravenous contrast. Multiplanar CT image reconstructions were also generated. COMPARISON:  X-ray 06/08/2019 FINDINGS: Alignment: Grade 1 anterolisthesis  C5 on C6 and T2 on T3. Skull base and vertebrae: No acute fracture. No suspicious bone lesion. Soft tissues and spinal canal: No prevertebral fluid or swelling. No visible canal hematoma. Disc levels: Severe degenerative changes at the C5-6 disc space and right C5-6 facet joint as well as the left C6-7 facet joint with prominent subchondral cysts versus erosions. There appears to be a moderate-sized disc protrusion at C3-4 likely resulting in at least mild canal stenosis. Upper chest: Enlarged, heterogeneous thyroid gland. Other: None. IMPRESSION: 1. No acute cervical spine fracture. 2. Severe degenerative changes at C5-6 and C6-7 with probable erosions involving the left C5-6 facet joint and right C6-7 facet joint concerning for an underlying inflammatory or crystalline arthropathy. 3. Moderate-sized disc protrusion at C3-4 likely resulting in at least mild canal stenosis. 4. Enlarged, heterogeneous thyroid gland. Electronically Signed   By: Davina Poke M.D.   On: 08/10/2019 17:06   Dg Chest Portable 1 View  Result Date: 08/10/2019 CLINICAL DATA:  85 year old female with chest pain, blackouts. EXAM: PORTABLE CHEST 1 VIEW COMPARISON:  Portable chest 06/09/2019 and earlier. FINDINGS: Portable AP semi upright view at 1507 hours. Improved lung volumes and ventilation. Allowing for portable technique the lungs are clear. Stable left chest cardiac pacemaker. Stable mild cardiomegaly. Other mediastinal contours are within normal limits. Paucity of bowel gas in the upper abdomen. No acute osseous abnormality identified. IMPRESSION: No acute cardiopulmonary abnormality. Electronically Signed   By: Genevie Ann M.D.   On: 08/10/2019 15:25   Vas [place] Lower Extremity Venous (dvt) (only Mc & Wl 7a-7p)  Result Date: 08/10/2019  Lower Venous Study Indications: Pain, Swelling, and broken right ankle.  Comparison Study: Prior study from 2013 is available for comparison Performing Technologist: Sharion Dove RVS   Examination Guidelines: A complete evaluation includes B-mode imaging, spectral Doppler, color Doppler, and power Doppler as needed of all accessible portions of each vessel. Bilateral testing is considered an integral part of a complete examination. Limited examinations for reoccurring indications may be performed as noted.  +---------+---------------+---------+-----------+----------+--------------+ RIGHT    CompressibilityPhasicitySpontaneityPropertiesThrombus Aging +---------+---------------+---------+-----------+----------+--------------+ CFV      Full           Yes      Yes                                 +---------+---------------+---------+-----------+----------+--------------+ SFJ      Full                                                        +---------+---------------+---------+-----------+----------+--------------+  FV Prox  Full                                                        +---------+---------------+---------+-----------+----------+--------------+ FV Mid   Full                                                        +---------+---------------+---------+-----------+----------+--------------+ FV DistalFull                                                        +---------+---------------+---------+-----------+----------+--------------+ PFV      Full                                                        +---------+---------------+---------+-----------+----------+--------------+ POP      Full           Yes      Yes                                 +---------+---------------+---------+-----------+----------+--------------+ PTV      None                                         Acute          +---------+---------------+---------+-----------+----------+--------------+ PERO     None                                         Acute          +---------+---------------+---------+-----------+----------+--------------+    +----+---------------+---------+-----------+----------+--------------+ LEFTCompressibilityPhasicitySpontaneityPropertiesThrombus Aging +----+---------------+---------+-----------+----------+--------------+ CFV Full           Yes      Yes                                 +----+---------------+---------+-----------+----------+--------------+     Summary: Right: Findings consistent with acute deep vein thrombosis involving the right posterior tibial veins, and right peroneal veins. Patient had SVT in varicosities in 2013 and chronic SVT at the [facility] which appear resolved Left: No evidence of common femoral vein obstruction.  *See table(s) above for measurements and observations.    Preliminary     Procedures Procedures (including critical care time)  Medications Ordered in ED Medications  sodium chloride flush (NS) 0.9 % injection 3 mL (has no administration in time range)  heparin ADULT infusion 100 units/mL (25000 units/294mL sodium chloride 0.45%) (1,200 Units/hr Intravenous New Bag/Given 08/10/19 1842)  0.9 %  sodium chloride infusion ( Intravenous New Bag/Given 08/10/19 1841)     Initial  Impression / Assessment and Plan / ED Course  I have reviewed the triage vital signs and the nursing notes.  Pertinent labs & imaging results that were available during my care of the patient were reviewed by me and considered in my medical decision making (see chart for details).  Clinical Course as of Aug 10 1951  Sat Aug 10, 2019  1607 Daughter says that she wasn't waking up, her head was back,  Gave sugar, came around a little and went out again, more sugar She said she was hot.  She was able to blink her  Fully out 2-3 minutes Face and lips turned light grey. Two mornings vision is blurry    [EH]  1844 Dr. Weber Cooks from radiology agrees VQ scan is needed.    [EH]  1951 I spoke with Dr. Alcario Drought who will see patient for admission.    [EH]    Clinical Course User Index [EH] Lorin Glass, PA-C      Presents today for evaluation of a syncopal event lasting approximately 2 to 3 minutes today.  Also reported chest pain in the ambulance on the way here however states that that is fully resolved at this time.  She did reportedly have a fall 2 to 3 days ago, and has reported blurry vision since.    Labs are obtained and reviewed.  She is anemic at 9.5 consistent with her baseline.  Her white count is low at 3.4, again consistent with her baseline.  She has mild thrombocytopenia with platelets of 108 consistent with her baseline.  Her sugar is elevated at 254.  BUN and creatinine are both elevated at 40 and 1.94 respectively, again consistent with her baseline.  High-sensitivity troponin is 42, which is improved from August when she was 159.  Chest x-ray is obtained without evidence of consolidation or other acute abnormality.  EKG without evidence of acute ischemia.  Given her immobilization with right leg swelling, active cancer, and age she is at increased risk for DVT/PE.  DVT study ordered showing right sided acute deep vein thrombosis involving the right posterior tibial veins, and right peroneal veins.  D-dimer elevated at 1.59.    Based on her elevated creatinine and decreased GFR unable to get CTA PE study.  I spoke with radiologist who agrees that VQ scan is needed.  Given the patient has acute DVT in the setting of being anticoagulated with Eliquis she is heparinized pending VQ scan.  Covid test was ordered.  Troponin did not significantly change while in my care.  Urine shows 0-5 red cells, 11-20 white cells with many bacteria and only 0-5 squamous epithelial cells.  Patient is not complaining of urinary specific symptoms however just complains of generally feeling unwell.  Urine culture was sent.  She does not have a significant leukocytosis.  At patient's request I spoke with patient's daughter and kept her informed of patient's plan of care throughout her  stay in the emergency room.  Pacemaker was interrogated without significant abnormalities found.  This patient was seen as a shared visit with Dr. Sherry Ruffing.   I spoke with Dr. Alcario Drought who agreed to admit the patient.   Final Clinical Impressions(s) / ED Diagnoses   Final diagnoses:  Syncope and collapse  Acute deep vein thrombosis (DVT) of other specified vein of right lower extremity [facility])    ED Discharge Orders    None       Lorin Glass, [place] 08/10/19 2003  Tegeler, Gwenyth Allegra, MD 08/11/19 1520  Addendum 08/16/19 CRITICAL CARE Performed by: Wyn Quaker Total critical care time: 40 minutes Critical care time was exclusive of separately billable procedures and treating other patients. Critical care was necessary to treat or prevent imminent or life-threatening deterioration. Critical care was time spent personally by me on the following activities: development of treatment plan with patient and/or surrogate as well as nursing, discussions with consultants, evaluation of patient's response to treatment, examination of patient, obtaining history from patient or surrogate, ordering and performing treatments and interventions, ordering and review of laboratory studies, ordering and review of radiographic studies, pulse oximetry and re-evaluation of patient's condition.    Lorin Glass, [place] 08/16/19 1839    Tegeler, Gwenyth Allegra, MD 08/19/19 1414

## 2019-08-10 NOTE — ED Notes (Signed)
Holly from [place] provided a report for today from this pt's pacer.

## 2019-08-10 NOTE — ED Notes (Signed)
Attempted to call for VQ scan, no answer.

## 2019-08-10 NOTE — Progress Notes (Signed)
ANTICOAGULATION CONSULT NOTE - Initial Consult  Pharmacy Consult for heparin Indication: DVT  Allergies  Allergen Reactions  . Aspirin Itching  . Penicillins Itching and Rash    DID THE REACTION INVOLVE: Swelling of the face/tongue/throat, SOB, or low BP? Yes Sudden or severe rash/hives, skin peeling, or the inside of the mouth or nose? No Did it require medical treatment? Yes When did it last happen?"More than 10 years ago" If all above answers are "NO", may proceed with cephalosporin use.     Patient Measurements:   Heparin Dosing Weight: 74.2kg  Vital Signs: Temp: 98.4 F (36.9 C) (10/17 1440) BP: 136/65 (10/17 1500) Pulse Rate: 62 (10/17 1445)  Labs: Recent Labs    08/10/19 1441  HGB 9.5*  HCT 29.6*  PLT 108*  LABPROT 14.9  INR 1.2  CREATININE 1.94*  TROPONINIHS 42*    Estimated Creatinine Clearance: 22.7 mL/min (A) (by C-G formula based on SCr of 1.94 mg/dL (H)).   Medical History: Past Medical History:  Diagnosis Date  . Abnormal echocardiogram    a. possible mass on echo 05/2019 on PPM -  Per Dr. Francesca Oman note, " In the absence of fever or sign of sepsis I would not pursue further work-up at this point given the patient age and comorbidities.  I would consider TEE once any signs of infection."   . Anemia   . Arthritis   . Cancer ([place])   . Cataract   . Chronic combined systolic and diastolic CHF (congestive heart failure) ([place])    a. Previously diastolic but patient AVS from outside hospital listed systolic CHF and cardiomyopathy, records pending.  . CKD (chronic kidney disease), stage III   . Clotting disorder ([place])   . CVA (cerebral vascular accident) ([place])    residual right sided weakness and mild dysphagia  . Diabetes mellitus ([place])   . Dizziness and giddiness   . DVT (deep venous thrombosis) ([facility])    a. on anticoagulation for this.  . Essential hypertension   . LBBB (left bundle branch block)   . Mitral regurgitation    a. Mod MR by echo  2014.  . Muscular deconditioning   . Obesity   . Pacemaker   . Pericardial effusion   . SVT (supraventricular tachycardia) ([place])    a. In 2013 she had an EPS with ablation for SVT which did not eliminate the SVT completely. She also had bradycardia which limited medication. She was placed on amiodarone by Dr. Lovena Le.  . Thrombocytopenia ([place]) 11/21/2011    Medications:  Infusions:  . sodium chloride    . heparin      Assessment: 72 yof presented to the ED with LOC. Found to have an acute DVT. Now starting IV heparin. Of note, pt is on apixaban PTA with her last dose this morning. Baseline Hgb and platelets are slightly low. No bleeding noted.   Goal of Therapy:  Heparin level 0.3-0.7 units/ml aPTT 66-103 seconds Monitor platelets by anticoagulation protocol: Yes   Plan:  Heparin gtt 1200 units/hr Check an 8 hr heparin level and aPTT Daily heparin level, aPTT and CBC  Cherilynn Schomburg, Rande Lawman 08/10/2019,5:58 PM

## 2019-08-10 NOTE — Progress Notes (Signed)
VASCULAR LAB PRELIMINARY  PRELIMINARY  PRELIMINARY  PRELIMINARY  Right lower extremity venous duplex completed.    Preliminary report:  See CV proc for preliminary results.  Gave Dr. Sherry Ruffing report  Mauro Kaufmann, Latashia Koch, RVT 08/10/2019, 5:22 PM

## 2019-08-10 NOTE — ED Notes (Signed)
NS will call us for this pt.

## 2019-08-10 NOTE — ED Notes (Signed)
All clear pacerwise, report given to Dr Sherry Ruffing.

## 2019-08-10 NOTE — ED Notes (Signed)
Messaged Pharmacy to verify medication.
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
## 2019-08-10 NOTE — ED Notes (Signed)
Readjusted purewick, removed soiled brief and placed another chuck pad under pt.

## 2019-08-10 NOTE — ED Notes (Signed)
Notified EDP to contact Rad to get VQ underway.

## 2019-08-10 NOTE — ED Notes (Signed)
Called lab to add on labs. Lab said they should be able to do all three new lab orders.

## 2019-08-11 ENCOUNTER — Encounter (HOSPITAL_COMMUNITY): Payer: Self-pay

## 2019-08-11 ENCOUNTER — Observation Stay (HOSPITAL_COMMUNITY): Payer: Medicare Other

## 2019-08-11 DIAGNOSIS — G4733 Obstructive sleep apnea (adult) (pediatric): Secondary | ICD-10-CM | POA: Diagnosis present

## 2019-08-11 DIAGNOSIS — I130 Hypertensive heart and chronic kidney disease with heart failure and stage 1 through stage 4 chronic kidney disease, or unspecified chronic kidney disease: Secondary | ICD-10-CM | POA: Diagnosis present

## 2019-08-11 DIAGNOSIS — E1122 Type 2 diabetes mellitus with diabetic chronic kidney disease: Secondary | ICD-10-CM | POA: Diagnosis not present

## 2019-08-11 DIAGNOSIS — R55 Syncope and collapse: Secondary | ICD-10-CM | POA: Diagnosis present

## 2019-08-11 DIAGNOSIS — I824Z1 Acute embolism and thrombosis of unspecified deep veins of right distal lower extremity: Secondary | ICD-10-CM | POA: Diagnosis not present

## 2019-08-11 DIAGNOSIS — W19XXXA Unspecified fall, initial encounter: Secondary | ICD-10-CM | POA: Diagnosis present

## 2019-08-11 DIAGNOSIS — R05 Cough: Secondary | ICD-10-CM | POA: Diagnosis not present

## 2019-08-11 DIAGNOSIS — N390 Urinary tract infection, site not specified: Secondary | ICD-10-CM | POA: Diagnosis present

## 2019-08-11 DIAGNOSIS — I951 Orthostatic hypotension: Secondary | ICD-10-CM | POA: Diagnosis present

## 2019-08-11 DIAGNOSIS — M1990 Unspecified osteoarthritis, unspecified site: Secondary | ICD-10-CM | POA: Diagnosis present

## 2019-08-11 DIAGNOSIS — Z7901 Long term (current) use of anticoagulants: Secondary | ICD-10-CM | POA: Diagnosis not present

## 2019-08-11 DIAGNOSIS — N184 Chronic kidney disease, stage 4 (severe): Secondary | ICD-10-CM | POA: Diagnosis not present

## 2019-08-11 DIAGNOSIS — Z20828 Contact with and (suspected) exposure to other viral communicable diseases: Secondary | ICD-10-CM | POA: Diagnosis present

## 2019-08-11 DIAGNOSIS — D631 Anemia in chronic kidney disease: Secondary | ICD-10-CM | POA: Diagnosis present

## 2019-08-11 DIAGNOSIS — N183 Chronic kidney disease, stage 3 unspecified: Secondary | ICD-10-CM | POA: Diagnosis not present

## 2019-08-11 DIAGNOSIS — Z880 Allergy status to penicillin: Secondary | ICD-10-CM | POA: Diagnosis not present

## 2019-08-11 DIAGNOSIS — I5043 Acute on chronic combined systolic (congestive) and diastolic (congestive) heart failure: Secondary | ICD-10-CM | POA: Diagnosis not present

## 2019-08-11 DIAGNOSIS — Z9114 Patient's other noncompliance with medication regimen: Secondary | ICD-10-CM | POA: Diagnosis not present

## 2019-08-11 DIAGNOSIS — K5900 Constipation, unspecified: Secondary | ICD-10-CM | POA: Diagnosis present

## 2019-08-11 DIAGNOSIS — Z886 Allergy status to analgesic agent status: Secondary | ICD-10-CM | POA: Diagnosis not present

## 2019-08-11 DIAGNOSIS — Z86711 Personal history of pulmonary embolism: Secondary | ICD-10-CM | POA: Diagnosis not present

## 2019-08-11 DIAGNOSIS — Z8673 Personal history of transient ischemic attack (TIA), and cerebral infarction without residual deficits: Secondary | ICD-10-CM | POA: Diagnosis not present

## 2019-08-11 DIAGNOSIS — Z86718 Personal history of other venous thrombosis and embolism: Secondary | ICD-10-CM | POA: Diagnosis not present

## 2019-08-11 DIAGNOSIS — N1832 Chronic kidney disease, stage 3b: Secondary | ICD-10-CM | POA: Diagnosis present

## 2019-08-11 DIAGNOSIS — I447 Left bundle-branch block, unspecified: Secondary | ICD-10-CM | POA: Diagnosis present

## 2019-08-11 DIAGNOSIS — S82891A Other fracture of right lower leg, initial encounter for closed fracture: Secondary | ICD-10-CM | POA: Diagnosis present

## 2019-08-11 DIAGNOSIS — Z950 Presence of cardiac pacemaker: Secondary | ICD-10-CM | POA: Diagnosis not present

## 2019-08-11 DIAGNOSIS — B9689 Other specified bacterial agents as the cause of diseases classified elsewhere: Secondary | ICD-10-CM | POA: Diagnosis present

## 2019-08-11 DIAGNOSIS — I10 Essential (primary) hypertension: Secondary | ICD-10-CM | POA: Diagnosis not present

## 2019-08-11 DIAGNOSIS — D72819 Decreased white blood cell count, unspecified: Secondary | ICD-10-CM | POA: Diagnosis present

## 2019-08-11 DIAGNOSIS — E875 Hyperkalemia: Secondary | ICD-10-CM | POA: Diagnosis present

## 2019-08-11 DIAGNOSIS — I82441 Acute embolism and thrombosis of right tibial vein: Secondary | ICD-10-CM | POA: Diagnosis present

## 2019-08-11 LAB — GLUCOSE, CAPILLARY
Glucose-Capillary: 186 mg/dL — ABNORMAL HIGH (ref 70–99)
Glucose-Capillary: 204 mg/dL — ABNORMAL HIGH (ref 70–99)
Glucose-Capillary: 76 mg/dL (ref 70–99)
Glucose-Capillary: 246 mg/dL — ABNORMAL HIGH (ref 70–99)

## 2019-08-11 LAB — CBC
HCT: 35.1 % — ABNORMAL LOW (ref 36.0–46.0)
Hemoglobin: 11.3 g/dL — ABNORMAL LOW (ref 12.0–15.0)
MCH: 30.3 pg (ref 26.0–34.0)
MCHC: 32.2 g/dL (ref 30.0–36.0)
MCV: 94.1 fL (ref 80.0–100.0)
Platelets: 133 10*3/uL — ABNORMAL LOW (ref 150–400)
RBC: 3.73 MIL/uL — ABNORMAL LOW (ref 3.87–5.11)
RDW: 14.5 % (ref 11.5–15.5)
WBC: 4.6 10*3/uL (ref 4.0–10.5)
nRBC: 0 % (ref 0.0–0.2)

## 2019-08-11 LAB — BASIC METABOLIC PANEL
Anion gap: 15 (ref 5–15)
BUN: 31 mg/dL — ABNORMAL HIGH (ref 8–23)
CO2: 25 mmol/L (ref 22–32)
Calcium: 8.8 mg/dL — ABNORMAL LOW (ref 8.9–10.3)
Chloride: 104 mmol/L (ref 98–111)
Creatinine, Ser: 1.63 mg/dL — ABNORMAL HIGH (ref 0.44–1.00)
GFR calc Af Amer: 33 mL/min — ABNORMAL LOW (ref >60)
GFR calc non Af Amer: 29 mL/min — ABNORMAL LOW (ref >60)
Glucose, Bld: 92 mg/dL (ref 70–99)
Potassium: 3.4 mmol/L — ABNORMAL LOW (ref 3.5–5.1)
Sodium: 144 mmol/L (ref 135–145)

## 2019-08-11 LAB — HEPARIN LEVEL (UNFRACTIONATED): Heparin Unfractionated: 0.84 IU/mL — ABNORMAL HIGH (ref 0.30–0.70)

## 2019-08-11 LAB — APTT
aPTT: 58 seconds — ABNORMAL HIGH (ref 24–36)
aPTT: 78 seconds — ABNORMAL HIGH (ref 24–36)

## 2019-08-11 MED ORDER — INFLUENZA VAC A&B SA ADJ QUAD 0.5 ML IM PRSY
0.5000 mL | PREFILLED_SYRINGE | INTRAMUSCULAR | Status: DC
  Filled 2019-08-11: qty 0.5

## 2019-08-11 NOTE — Plan of Care (Signed)
Poc progressing.  

## 2019-08-11 NOTE — Progress Notes (Signed)
Pt received from ED, ao x4. Breathing even and unlabored in RA. CHG bath completed, connected to tele and CCMD notified. Oriented pt to room and call bell system. Will continue to monitor.

## 2019-08-11 NOTE — Progress Notes (Addendum)
PROGRESS NOTE    [name]  LZJ:[id number] DOB: [birth year] DOA: 08/10/2019 PCP: [name], MD   Brief Narrative:  HPI on 10/172020 by Dr. Jennette Kettle [name] is a 85 y.o. female with medical history significant of DVT and PE on eliquis, breast cancer undergoing neoadjuvant hormone therapy, chronic CHF with EF 40-45%, CKD stage 3, sinus pauses s/p PPM.  Patient has a broken R ankle.  Patient presents to the ED after a syncopal episode today.  She was reportedly eating when she felt her vision go blurry. She called her daughter into the room who she states that she could hear talking to her but could not see.   Her daughter reportsthat her mother had just finished eating and she went to see her and her head was rolled back. She states that she was unable to wake her. She then put sugar on her finger and rubbed it inside her mother's mouth thinking that maybe the sugar was low. She reportedly "came around a little and then went out again." She gave her more sugar. She states that she was fully out for approximately 2 to 3 minutes and during this time her face and lips turned to light gray. Daughter reports that patient has been complaining of blurred vision the past 2 mornings.  Had CP in ambulance which resolved. Assessment & Plan   DVT -Patient tells me she was on Coumadin quite a long time ago and was transitioned to Eliquis recently approximately 3 months ago -Closely provoked by a broken ankle -Lower extremity Doppler acute DVT right posterior tibial veins, right peroneal veins.  -VQ scan unremarkable for PE -Currently placed on heparin -Will discuss with oncology  Syncope -Possibly secondary to hypoglycemia as patient admitted to not eating much -CT head shows no acute or cranial findings -Pacemaker was interrogated and no arrhythmias were noted (per H&P, however no record or report within chart) -Troponins flat -V/Q as above -Pending standing  orthostatic vitals.  Orthostatic vitals from laying to sitting were unremarkable. -Had recent Echocardiogram Sept 2020- see below  History of breast cancer -Patient follows with oncology, Dr. Payton Mccallum -Continue arimidex  Diabetes mellitus, type II -Continue Levemir, insulin sliding scale, CBG monitoring  Essential hypertension -Continue Coreg, BiDil, Demadex  Chronic kidney disease, stage III -Creatinine stable, currently 0.24  Chronic systolic and diastolic CHF -Echocardiogram 07/22/2019 showed EF of 40 to 09%, LV diastolic parameters consistent with impaired relaxation.  Severe hypokinesis in the distal LV. -Appears to be euvolemic and stable -Continue Demadex  Hypokalemia -continue supplementation, monitor BMP  Severe obstructive sleep apnea -Patient had sleep study done on 08/02/2019 showing severe obstructive sleep apnea, recommendations were trial of BiPAP therapy 17/13 -Order BiPAP  History of recurrent SVT/LBBB -Has pacemaker in place given that she had symptomatic daytime pauses (This occurred in [place]) -Of note patient followed up with Dr. Lovena Le, cardiology on 07/22/2019 was noted to have transient AV block with syncope and found to be asymptomatic at that time.  Her Biotronik DDD p.m. is working normally. -on amiodarone  DVT Prophylaxis  heparin  Code Status: Full  Family Communication: None at bedside  Disposition Plan: Admitted.  Patient currently on IV heparin for failed therapy of Eliquis for DVT.  Will need to discuss further anticoagulation options with hematology oncology.  Also pending physical therapy evaluation given syncopal episode.  Consultants None  Procedures  Lower extremity Doppler VQ scan  Antibiotics   Anti-infectives (From admission, onward)   None  Subjective:   [name] seen and examined today.  Patient only complaining of feeling tired today.  Denies chest pain, shortness of breath, abdominal pain, nausea vomiting,  diarrhea constipation, dizziness, or headache.  Objective:   Vitals:   08/11/19 0859 08/11/19 0900 08/11/19 1100 08/11/19 1200  BP: 129/67 129/67    Pulse: 78 74 64 64  Resp: 18 17 16  (!) 26  Temp:      TempSrc:      SpO2:   97% 98%  Weight:      Height:        Intake/Output Summary (Last 24 hours) at 08/11/2019 1357 Last data filed at 08/11/2019 1347 Gross per 24 hour  Intake 452.35 ml  Output 1550 ml  Net -1097.65 ml   Filed Weights   08/11/19 0403  Weight: 77 kg    Exam  General: Well developed, well nourished, NAD, appears stated age  35: NCAT,  mucous membranes moist.   Cardiovascular: S1 S2 auscultated,   Respiratory: Clear to auscultation bilaterally with equal chest rise  Abdomen: Soft, nontender, nondistended, + bowel sounds  Extremities: warm dry without cyanosis clubbing or edema  Neuro: AAOx3, nonfocal  Psych: Normal affect and demeanor   Data Reviewed: I have personally reviewed following labs and imaging studies  CBC: Recent Labs  Lab 08/10/19 1441 08/11/19 0449  WBC 3.4* 4.6  HGB 9.5* 11.3*  HCT 29.6* 35.1*  MCV 96.4 94.1  PLT 108* 638*   Basic Metabolic Panel: Recent Labs  Lab 08/10/19 1441 08/11/19 0449  NA 140 144  K 3.8 3.4*  CL 102 104  CO2 26 25  GLUCOSE 254* 92  BUN 40* 31*  CREATININE 1.94* 1.63*  CALCIUM 8.7* 8.8*   GFR: Estimated Creatinine Clearance: 26.4 mL/min (A) (by C-G formula based on SCr of 1.63 mg/dL (H)). Liver Function Tests: Recent Labs  Lab 08/10/19 1441  AST 38  ALT 41  ALKPHOS 97  BILITOT 0.5  PROT 6.9  ALBUMIN 3.0*   No results for input(s): LIPASE, AMYLASE in the last 168 hours. No results for input(s): AMMONIA in the last 168 hours. Coagulation Profile: Recent Labs  Lab 08/10/19 1441  INR 1.2   Cardiac Enzymes: No results for input(s): CKTOTAL, CKMB, CKMBINDEX, TROPONINI in the last 168 hours. BNP (last 3 results) No results for input(s): PROBNP in the last 8760  hours. HbA1C: No results for input(s): HGBA1C in the last 72 hours. CBG: Recent Labs  Lab 08/10/19 1623 08/10/19 2123 08/11/19 0643 08/11/19 1207  GLUCAP 309* 152* 186* 204*   Lipid Profile: No results for input(s): CHOL, HDL, LDLCALC, TRIG, CHOLHDL, LDLDIRECT in the last 72 hours. Thyroid Function Tests: No results for input(s): TSH, T4TOTAL, FREET4, T3FREE, THYROIDAB in the last 72 hours. Anemia Panel: No results for input(s): VITAMINB12, FOLATE, FERRITIN, TIBC, IRON, RETICCTPCT in the last 72 hours. Urine analysis:    Component Value Date/Time   COLORURINE YELLOW 08/10/2019 1624   APPEARANCEUR HAZY (A) 08/10/2019 1624   LABSPEC 1.008 08/10/2019 1624   PHURINE 5.0 08/10/2019 1624   GLUCOSEU NEGATIVE 08/10/2019 1624   HGBUR NEGATIVE 08/10/2019 1624   BILIRUBINUR NEGATIVE 08/10/2019 1624   BILIRUBINUR negative 05/21/2019 1448   KETONESUR NEGATIVE 08/10/2019 1624   PROTEINUR NEGATIVE 08/10/2019 1624   UROBILINOGEN 0.2 05/21/2019 1448   UROBILINOGEN 0.2 10/12/2012 2037   NITRITE NEGATIVE 08/10/2019 1624   LEUKOCYTESUR LARGE (A) 08/10/2019 1624   Sepsis Labs: @LABRCNTIP (procalcitonin:4,lacticidven:4)  ) Recent Results (from the past 240 hour(s))  SARS CORONAVIRUS 2 (TAT 6-24 HRS) Nasopharyngeal Nasopharyngeal Swab     Status: None   Collection Time: 08/10/19  6:24 PM   Specimen: Nasopharyngeal Swab  Result Value Ref Range Status   SARS Coronavirus 2 NEGATIVE NEGATIVE Final    Comment: (NOTE) SARS-CoV-2 target nucleic acids are NOT DETECTED. The SARS-CoV-2 RNA is generally detectable in upper and lower respiratory specimens during the acute phase of infection. Negative results do not preclude SARS-CoV-2 infection, do not rule out co-infections with other pathogens, and should not be used as the sole basis for treatment or other patient management decisions. Negative results must be combined with clinical observations, patient history, and epidemiological information.  The expected result is Negative. Fact Sheet for Patients: SugarRoll.be Fact Sheet for Healthcare Providers: https://www.woods-mathews.com/ This test is not yet approved or cleared by the [place] FDA and  has been authorized for detection and/or diagnosis of SARS-CoV-2 by FDA under an Emergency Use Authorization (EUA). This EUA will remain  in effect (meaning this test can be used) for the duration of the COVID-19 declaration under Section 56 4(b)(1) of the Act, 21 U.S.C. section 360bbb-3(b)(1), unless the authorization is terminated or revoked sooner. Performed at [facility], [place] [address], [place], [place] [address]       Radiology Studies: Dg Lumbar Spine Complete  Result Date: 08/10/2019 CLINICAL DATA:  85 year old female with fall and back pain. EXAM: LUMBAR SPINE - COMPLETE 4+ VIEW; PELVIS - 1-2 VIEW COMPARISON:  None. FINDINGS: There is no acute fracture or subluxation of the lumbar spine. There is osteopenia with multilevel degenerative changes. Grade 1 L4-L5 anterolisthesis. Multilevel disc desiccation and vacuum phenomena. No definite acute fracture of the pelvis identified. There is arthritic changes of the hips. The soft tissues are grossly unremarkable. Punctate radiopaque focus over the right renal silhouette may represent a tiny calculus. IMPRESSION: No acute/traumatic lumbar spine or pelvic pathology identified. Electronically Signed   By: Anner Crete M.D.   On: 08/10/2019 17:19   Dg Pelvis 1-2 Views  Result Date: 08/10/2019 CLINICAL DATA:  85 year old female with fall and back pain. EXAM: LUMBAR SPINE - COMPLETE 4+ VIEW; PELVIS - 1-2 VIEW COMPARISON:  None. FINDINGS: There is no acute fracture or subluxation of the lumbar spine. There is osteopenia with multilevel degenerative changes. Grade 1 L4-L5 anterolisthesis. Multilevel disc desiccation and vacuum phenomena. No definite acute fracture of the pelvis  identified. There is arthritic changes of the hips. The soft tissues are grossly unremarkable. Punctate radiopaque focus over the right renal silhouette may represent a tiny calculus. IMPRESSION: No acute/traumatic lumbar spine or pelvic pathology identified. Electronically Signed   By: Anner Crete M.D.   On: 08/10/2019 17:19   Ct Head Wo Contrast  Result Date: 08/10/2019 CLINICAL DATA:  Syncope, head trauma EXAM: CT HEAD WITHOUT CONTRAST TECHNIQUE: Contiguous axial images were obtained from the base of the skull through the vertex without intravenous contrast. COMPARISON:  07/13/2017, 11/06/2018 FINDINGS: Brain: No evidence of acute infarction, hemorrhage, hydrocephalus, extra-axial collection or mass lesion/mass effect. Mild cerebral volume loss, age-appropriate. Vascular: Mild atherosclerotic calcifications involving the large vessels of the skull base. No unexpected hyperdense vessel. Skull: Normal. Negative for fracture or focal lesion. Sinuses/Orbits: Scattered bilateral ethmoid air cell opacification. Remaining paranasal sinuses and mastoid air cells are clear. Orbital structures unremarkable. Other: None. IMPRESSION: 1. No acute intracranial findings. 2. Mild ethmoid sinus disease. Electronically Signed   By: Davina Poke M.D.   On: 08/10/2019 16:54   Ct  Cervical Spine Wo Contrast  Result Date: 08/10/2019 CLINICAL DATA:  Fall, syncope EXAM: CT CERVICAL SPINE WITHOUT CONTRAST TECHNIQUE: Multidetector CT imaging of the cervical spine was performed without intravenous contrast. Multiplanar CT image reconstructions were also generated. COMPARISON:  X-ray 06/08/2019 FINDINGS: Alignment: Grade 1 anterolisthesis C5 on C6 and T2 on T3. Skull base and vertebrae: No acute fracture. No suspicious bone lesion. Soft tissues and spinal canal: No prevertebral fluid or swelling. No visible canal hematoma. Disc levels: Severe degenerative changes at the C5-6 disc space and right C5-6 facet joint as well as  the left C6-7 facet joint with prominent subchondral cysts versus erosions. There appears to be a moderate-sized disc protrusion at C3-4 likely resulting in at least mild canal stenosis. Upper chest: Enlarged, heterogeneous thyroid gland. Other: None. IMPRESSION: 1. No acute cervical spine fracture. 2. Severe degenerative changes at C5-6 and C6-7 with probable erosions involving the left C5-6 facet joint and right C6-7 facet joint concerning for an underlying inflammatory or crystalline arthropathy. 3. Moderate-sized disc protrusion at C3-4 likely resulting in at least mild canal stenosis. 4. Enlarged, heterogeneous thyroid gland. Electronically Signed   By: Davina Poke M.D.   On: 08/10/2019 17:06   Nm Pulmonary Vent And Perf (v/q Scan)  Result Date: 08/10/2019 CLINICAL DATA:  Lower extremity DVT EXAM: NUCLEAR MEDICINE VENTILATION - PERFUSION LUNG SCAN TECHNIQUE: Ventilation images were obtained in multiple projections using inhaled aerosol Tc-54m DTPA. Perfusion images were obtained in multiple projections after intravenous injection of Tc-44m MAA. RADIOPHARMACEUTICALS:  38.7 mCi of Tc-35m DTPA aerosol inhalation and 1.65 mCi Tc28m MAA IV COMPARISON:  None. FINDINGS: Ventilation: No focal ventilation defect. Perfusion: No wedge shaped peripheral perfusion defects to suggest acute pulmonary embolism. IMPRESSION: Normal ventilation perfusion scan. Electronically Signed   By: Prudencio Pair M.D.   On: 08/10/2019 21:36   Dg Chest Portable 1 View  Result Date: 08/10/2019 CLINICAL DATA:  85 year old female with chest pain, blackouts. EXAM: PORTABLE CHEST 1 VIEW COMPARISON:  Portable chest 06/09/2019 and earlier. FINDINGS: Portable AP semi upright view at 1507 hours. Improved lung volumes and ventilation. Allowing for portable technique the lungs are clear. Stable left chest cardiac pacemaker. Stable mild cardiomegaly. Other mediastinal contours are within normal limits. Paucity of bowel gas in the upper  abdomen. No acute osseous abnormality identified. IMPRESSION: No acute cardiopulmonary abnormality. Electronically Signed   By: Genevie Ann M.D.   On: 08/10/2019 15:25   Vas [place] Lower Extremity Venous (dvt) (only Mc & Wl 7a-7p)  Result Date: 08/10/2019  Lower Venous Study Indications: Pain, Swelling, and broken right ankle.  Comparison Study: Prior study from 2013 is available for comparison Performing Technologist: Sharion Dove RVS  Examination Guidelines: A complete evaluation includes B-mode imaging, spectral Doppler, color Doppler, and power Doppler as needed of all accessible portions of each vessel. Bilateral testing is considered an integral part of a complete examination. Limited examinations for reoccurring indications may be performed as noted.  +---------+---------------+---------+-----------+----------+--------------+ RIGHT    CompressibilityPhasicitySpontaneityPropertiesThrombus Aging +---------+---------------+---------+-----------+----------+--------------+ CFV      Full           Yes      Yes                                 +---------+---------------+---------+-----------+----------+--------------+ SFJ      Full                                                        +---------+---------------+---------+-----------+----------+--------------+  FV Prox  Full                                                        +---------+---------------+---------+-----------+----------+--------------+ FV Mid   Full                                                        +---------+---------------+---------+-----------+----------+--------------+ FV DistalFull                                                        +---------+---------------+---------+-----------+----------+--------------+ PFV      Full                                                        +---------+---------------+---------+-----------+----------+--------------+ POP      Full           Yes      Yes                                  +---------+---------------+---------+-----------+----------+--------------+ PTV      None                                         Acute          +---------+---------------+---------+-----------+----------+--------------+ PERO     None                                         Acute          +---------+---------------+---------+-----------+----------+--------------+   +----+---------------+---------+-----------+----------+--------------+ LEFTCompressibilityPhasicitySpontaneityPropertiesThrombus Aging +----+---------------+---------+-----------+----------+--------------+ CFV Full           Yes      Yes                                 +----+---------------+---------+-----------+----------+--------------+     Summary: Right: Findings consistent with acute deep vein thrombosis involving the right posterior tibial veins, and right peroneal veins. Patient had SVT in varicosities in 2013 and chronic SVT at the [facility] which appear resolved Left: No evidence of common femoral vein obstruction.  *See table(s) above for measurements and observations.    Preliminary      Scheduled Meds: . amiodarone  200 mg Oral Daily  . anastrozole  1 mg Oral Daily  . atorvastatin  20 mg Oral QHS  . calcium carbonate  1,250 mg Oral Q breakfast  . carvedilol  12.5 mg Oral BID WC  . diclofenac sodium  2 g Topical QHS  . ferrous sulfate  325 mg Oral Q breakfast  . [  START ON 08/12/2019] influenza vaccine adjuvanted  0.5 mL Intramuscular Tomorrow-1000  . insulin aspart  0-9 Units Subcutaneous TID WC  . insulin aspart  3 Units Subcutaneous TID WC  . insulin detemir  16 Units Subcutaneous Daily  . insulin detemir  8 Units Subcutaneous QHS  . isosorbide-hydrALAZINE  1 tablet Oral TID  . magnesium oxide  400 mg Oral Daily  . oxybutynin  5 mg Oral BID  . pantoprazole  80 mg Oral Daily  . potassium chloride SA  40 mEq Oral Daily  . pregabalin  75 mg Oral QHS  . sodium chloride  flush  3 mL Intravenous Q12H  . torsemide  20 mg Oral QPM  . torsemide  40 mg Oral Daily   Continuous Infusions: . heparin 1,300 Units/hr (08/11/19 0650)     LOS: 0 days   Time Spent in minutes   45 minutes (greater than 50% of time spent with patient face to face, as well as reviewing old records, calling consults, and formulating a plan)   Radonna Bracher D.O. on 08/11/2019 at 1:57 PM  Between 7am to 7pm - Please see pager noted on [email or web address]  After 7pm go to [email or web address]  And look for the night coverage person covering for me after hours  [facility] Office  [phone]

## 2019-08-11 NOTE — Progress Notes (Signed)
ANTICOAGULATION CONSULT NOTE - [place] for heparin Indication: DVT  Allergies  Allergen Reactions  . Aspirin Itching  . Penicillins Itching and Rash    DID THE REACTION INVOLVE: Swelling of the face/tongue/throat, SOB, or low BP? Yes Sudden or severe rash/hives, skin peeling, or the inside of the mouth or nose? No Did it require medical treatment? Yes When did it last happen?"More than 10 years ago" If all above answers are "NO", may proceed with cephalosporin use.     Patient Measurements: Height: 5\' 5"  (165.1 cm) Weight: 169 lb 12.1 oz (77 kg) IBW/kg (Calculated) : 57 Heparin Dosing Weight: 74.2kg  Vital Signs: Temp: 99.1 F (37.3 C) (10/18 0403) Temp Source: Oral (10/18 0403) BP: 129/67 (10/18 0900) Pulse Rate: 64 (10/18 1200)  Labs: Recent Labs    08/10/19 1441 08/10/19 1729 08/10/19 1834 08/10/19 2209 08/11/19 0449 08/11/19 1456  HGB 9.5*  --   --   --  11.3*  --   HCT 29.6*  --   --   --  35.1*  --   PLT 108*  --   --   --  133*  --   APTT  --   --   --   --  58* 78*  LABPROT 14.9  --   --   --   --   --   INR 1.2  --   --   --   --   --   HEPARINUNFRC  --   --   --   --  0.84*  --   CREATININE 1.94*  --   --   --  1.63*  --   TROPONINIHS 42* 39* 37* 46*  --   --     Estimated Creatinine Clearance: 26.4 mL/min (A) (by C-G formula based on SCr of 1.63 mg/dL (H)).   Medical History: Past Medical History:  Diagnosis Date  . Abnormal echocardiogram    a. possible mass on echo 05/2019 on PPM, b. no evidence of mass / atrial lead vegetation on follow up echo 06/2019  . Anemia   . Arthritis   . Cancer ([place])   . Cataract   . Chronic combined systolic and diastolic CHF (congestive heart failure) ([place])    a. Previously diastolic but patient AVS from outside hospital listed systolic CHF and cardiomyopathy, records pending.  . CKD (chronic kidney disease), stage III   . Clotting disorder ([place])   . CVA (cerebral vascular accident)  ([place])    residual right sided weakness and mild dysphagia  . Diabetes mellitus ([place])   . Dizziness and giddiness   . DVT (deep venous thrombosis) ([facility])    a. on anticoagulation for this.  . Essential hypertension   . LBBB (left bundle branch block)   . Mitral regurgitation    a. Mod MR by echo 2014.  . Muscular deconditioning   . Obesity   . Pacemaker   . Pericardial effusion   . SVT (supraventricular tachycardia) ([place])    a. In 2013 she had an EPS with ablation for SVT which did not eliminate the SVT completely. She also had bradycardia which limited medication. She was placed on amiodarone by Dr. Lovena Le.  . Thrombocytopenia ([place]) 11/21/2011    Medications:  Infusions:  . heparin 1,300 Units/hr (08/11/19 0650)    Assessment: 76 yof presented to the ED with LOC. Found to have an acute DVT. Now starting IV heparin. Of note, pt is on apixaban PTA.  APTT therapeutic this afternoon at 78 seconds, CBC stable this am.  Goal of Therapy:  Heparin level 0.3-0.7 units/ml aPTT 66-103 seconds Monitor platelets by anticoagulation protocol: Yes   Plan:  -Continue heparin to 1300 units/hr -Daily aPTT and CBC   Arrie Senate, PharmD, BCPS Clinical Pharmacist [phone] Please check AMION for all [place] numbers 08/11/2019

## 2019-08-11 NOTE — ED Notes (Signed)
Daughter [name] called and would like a status update [phone] also states that her mother is hungry and her legs are hurting because of it.  States she is a diabetic and needs to eat--Deziya Amero

## 2019-08-11 NOTE — Progress Notes (Signed)
ANTICOAGULATION CONSULT NOTE - Follow Up Consult  Pharmacy Consult for heparin Indication: DVT  Labs: Recent Labs    08/10/19 1441 08/10/19 1729 08/10/19 1834 08/10/19 2209 08/11/19 0449  HGB 9.5*  --   --   --  11.3*  HCT 29.6*  --   --   --  35.1*  PLT 108*  --   --   --  133*  APTT  --   --   --   --  58*  LABPROT 14.9  --   --   --   --   INR 1.2  --   --   --   --   CREATININE 1.94*  --   --   --  1.63*  TROPONINIHS 42* 39* 37* 46*  --     Assessment: 86yo female subtherapeutic on heparin with initial dosing while Eliquis on hold; no gtt issues or signs of bleeding per RN.  Goal of Therapy:  aPTT 66-102 seconds   Plan:  Will increase heparin gtt by 1-2 units/kg/hr to 1300 units/hr and check PTT in 8 hours.    Wynona Neat, PharmD, BCPS  08/11/2019,6:48 AM

## 2019-08-11 NOTE — Progress Notes (Signed)
PT Cancellation Note  Patient Details Name: [name] MRN: [id number] DOB: 06-04-1933   Cancelled Treatment:    Reason Eval/Treat Not Completed: Medical issues which prohibited therapy Patient with acute DVT, on Heparin but has not hit 24-hour mark required to start PT evaluation per [facility] policy. Will check back when patient is medically appropriate.   Deniece Ree PT, DPT, CBIS  Supplemental Physical Therapist [facility]    Pager [phone] Acute Rehab Office [phone]

## 2019-08-11 NOTE — Progress Notes (Signed)
Attempted CPAP and pt started sitting up and screaming that she can't handle all that air. She said she has had her sleep study but doesn't have anything at home yet. She is refusing CPAP for tonight.

## 2019-08-12 ENCOUNTER — Telehealth: Payer: Self-pay | Admitting: Hematology and Oncology

## 2019-08-12 ENCOUNTER — Other Ambulatory Visit: Payer: Self-pay | Admitting: *Deleted

## 2019-08-12 DIAGNOSIS — R55 Syncope and collapse: Secondary | ICD-10-CM | POA: Diagnosis not present

## 2019-08-12 DIAGNOSIS — E1122 Type 2 diabetes mellitus with diabetic chronic kidney disease: Secondary | ICD-10-CM | POA: Diagnosis not present

## 2019-08-12 DIAGNOSIS — N183 Chronic kidney disease, stage 3 unspecified: Secondary | ICD-10-CM | POA: Diagnosis not present

## 2019-08-12 DIAGNOSIS — I824Z1 Acute embolism and thrombosis of unspecified deep veins of right distal lower extremity: Secondary | ICD-10-CM | POA: Diagnosis not present

## 2019-08-12 DIAGNOSIS — C50311 Malignant neoplasm of lower-inner quadrant of right female breast: Secondary | ICD-10-CM

## 2019-08-12 DIAGNOSIS — Z170 Estrogen receptor positive status [ER+]: Secondary | ICD-10-CM

## 2019-08-12 LAB — GLUCOSE, CAPILLARY
Glucose-Capillary: 79 mg/dL (ref 70–99)
Glucose-Capillary: 190 mg/dL — ABNORMAL HIGH (ref 70–99)
Glucose-Capillary: 129 mg/dL — ABNORMAL HIGH (ref 70–99)
Glucose-Capillary: 102 mg/dL — ABNORMAL HIGH (ref 70–99)

## 2019-08-12 LAB — CBC
HCT: 30.6 % — ABNORMAL LOW (ref 36.0–46.0)
Hemoglobin: 9.6 g/dL — ABNORMAL LOW (ref 12.0–15.0)
MCH: 30.1 pg (ref 26.0–34.0)
MCHC: 31.4 g/dL (ref 30.0–36.0)
MCV: 95.9 fL (ref 80.0–100.0)
Platelets: 112 10*3/uL — ABNORMAL LOW (ref 150–400)
RBC: 3.19 MIL/uL — ABNORMAL LOW (ref 3.87–5.11)
RDW: 15 % (ref 11.5–15.5)
WBC: 3.5 10*3/uL — ABNORMAL LOW (ref 4.0–10.5)
nRBC: 0 % (ref 0.0–0.2)

## 2019-08-12 LAB — APTT: aPTT: 58 seconds — ABNORMAL HIGH (ref 24–36)

## 2019-08-12 LAB — HEPARIN LEVEL (UNFRACTIONATED): Heparin Unfractionated: 0.89 IU/mL — ABNORMAL HIGH (ref 0.30–0.70)

## 2019-08-12 MED ORDER — WARFARIN - PHARMACIST DOSING INPATIENT
Freq: Every day | Status: DC
  Administered 2019-08-20: 17:00:00

## 2019-08-12 MED ORDER — WARFARIN SODIUM 5 MG PO TABS
5.0000 mg | ORAL_TABLET | Freq: Once | ORAL | Status: AC
  Administered 2019-08-12: 17:00:00 5 mg via ORAL
  Filled 2019-08-12: qty 1

## 2019-08-12 NOTE — Progress Notes (Addendum)
ANTICOAGULATION CONSULT NOTE - Follow Up Consult  Pharmacy Consult for heparin / Warfarin Indication: DVT  Labs: Recent Labs    08/10/19 1441 08/10/19 1729 08/10/19 1834 08/10/19 2209 08/11/19 0449 08/11/19 1456 08/12/19 0557  HGB 9.5*  --   --   --  11.3*  --  9.6*  HCT 29.6*  --   --   --  35.1*  --  30.6*  PLT 108*  --   --   --  133*  --  112*  APTT  --   --   --   --  58* 78* 58*  LABPROT 14.9  --   --   --   --   --   --   INR 1.2  --   --   --   --   --   --   HEPARINUNFRC  --   --   --   --  0.84*  --  0.89*  CREATININE 1.94*  --   --   --  1.63*  --   --   TROPONINIHS 42* 39* 37* 46*  --   --   --     Assessment: 85yo female subtherapeutic on heparin while Eliquis on hold; no gtt issues or signs of bleeding per RN. PTT low this AM   Adding warfarin today  Goal of Therapy:  aPTT 66-102 seconds   Plan:  Increase heparin to 1450 units / hr Warfarin 5 mg po x 1 dose tonight Daily heparin level, PTT, CBC, INR  Thank you Anette Guarneri, PharmD  08/12/2019,8:08 AM

## 2019-08-12 NOTE — Telephone Encounter (Signed)
Scheduled appt per 10/19 sch message - pt daughter aware of appt date and time

## 2019-08-12 NOTE — Evaluation (Signed)
Physical Therapy Evaluation Patient Details Name: [name] MRN: [id number] DOB: 1933/08/30 Today's Date: 08/12/2019   History of Present Illness  85 y.o. female with medical history significant of DVT and PE on eliquis, breast cancer undergoing neoadjuvant hormone therapy, chronic CHF with EF 40-45%, CKD stage 3, sinus pauses s/p PPM. She presented to the ED after a syncopal episode at home. Pt currently with R ankle fx diagnosed approx 2 weeks ago. Doppler revealed acute DVT R distal LE.    Clinical Impression  Pt admitted with above diagnosis. On eval, pt required min assist bed mobility and min assist transfers.Air cast donned RLE for mobility. Pt currently with functional limitations due to the deficits listed below (see PT Problem List). Pt will benefit from skilled PT to increase their independence and safety with mobility to allow discharge to the venue listed below.       Follow Up Recommendations Home health PT;Supervision/Assistance - 24 hour    Equipment Recommendations  None recommended by PT    Recommendations for Other Services       Precautions / Restrictions Precautions Precautions: Fall Required Braces or Orthoses: Other Brace Other Brace: air cast RLE for transfers/ambulation Restrictions Weight Bearing Restrictions: No Other Position/Activity Restrictions: No WB orders in chart. Per pt. WBAT in air cast RLE and limit walking.      Mobility  Bed Mobility Overal bed mobility: Needs Assistance Bed Mobility: Supine to Sit     Supine to sit: Min assist;HOB elevated     General bed mobility comments: +rail, increased time  Transfers Overall transfer level: Needs assistance Equipment used: 1 person hand held assist Transfers: Sit to/from [facility] Sit to Stand: Min assist Stand pivot transfers: Min assist       General transfer comment: assist to power up and take 5-6 pivot steps toward R, bed to recliner. Utilized +1 HHA instead  of RW due to pt preference. She is concerned about using RW due to reporting the RW is what caused her to fall at home.  Ambulation/Gait             General Gait Details: Pt declining. Fear of falling. Also reports she is suppose to limit walking due to R ankle fx. No WB/mobility orders in chart.  Stairs            Wheelchair Mobility    Modified Rankin (Stroke Patients Only)       Balance Overall balance assessment: Needs assistance;History of Falls Sitting-balance support: No upper extremity supported;Feet supported Sitting balance-Leahy Scale: Fair     Standing balance support: Bilateral upper extremity supported;During functional activity Standing balance-Leahy Scale: Poor Standing balance comment: reliant on external support                             Pertinent Vitals/Pain Pain Assessment: Faces Faces Pain Scale: Hurts a little bit Pain Location: RLE Pain Descriptors / Indicators: Sore Pain Intervention(s): Limited activity within patient's tolerance;Monitored during session;Repositioned    Home Living Family/patient expects to be discharged to:: Private residence Living Arrangements: Children Available Help at Discharge: Family;Available 24 hours/day Type of Home: Apartment Home Access: Level entry     Home Layout: One level Home Equipment: Walker - 4 wheels;Wheelchair - [employer] - 2 wheels;Bedside commode      Prior Function Level of Independence: Needs assistance   Gait / Transfers Assistance Needed: ambulatory with RW vs wheelchair. Has been primarily w/c bound  since R ankle fx.  ADL's / Homemaking Assistance Needed: daughter assists with bathing and dressing  Comments: Pt active with HHPT/OT at time of admission.     Hand Dominance        Extremity/Trunk Assessment   Upper Extremity Assessment Upper Extremity Assessment: Generalized weakness    Lower Extremity Assessment Lower Extremity Assessment: Generalized  weakness    Cervical / Trunk Assessment Cervical / Trunk Assessment: Kyphotic  Communication   Communication: No difficulties  Cognition Arousal/Alertness: Awake/alert Behavior During Therapy: WFL for tasks assessed/performed Overall Cognitive Status: Within Functional Limits for tasks assessed                                        General Comments      Exercises     Assessment/Plan    PT Assessment Patient needs continued PT services  PT Problem List Decreased strength;Decreased mobility;Decreased activity tolerance;Pain;Decreased balance       PT Treatment Interventions Therapeutic activities;Gait training;Therapeutic exercise;Patient/family education;Balance training;Functional mobility training    PT Goals (Current goals can be found in the Care Plan section)  Acute Rehab PT Goals Patient Stated Goal: home PT Goal Formulation: With patient Time For Goal Achievement: 08/26/19 Potential to Achieve Goals: Good    Frequency Min 3X/week   Barriers to discharge        Co-evaluation               AM-PAC PT "6 Clicks" Mobility  Outcome Measure Help needed turning from your back to your side while in a flat bed without using bedrails?: A Little Help needed moving from lying on your back to sitting on the side of a flat bed without using bedrails?: A Little Help needed moving to and from a bed to a chair (including a wheelchair)?: A Little Help needed standing up from a chair using your arms (e.g., wheelchair or bedside chair)?: A Little Help needed to walk in hospital room?: A Little Help needed climbing 3-5 steps with a railing? : A Lot 6 Click Score: 17    End of Session Equipment Utilized During Treatment: Gait belt Activity Tolerance: Patient tolerated treatment well Patient left: in chair;with call bell/phone within reach Nurse Communication: Mobility status PT Visit Diagnosis: Other abnormalities of gait and mobility (R26.89);History  of falling (Z91.81);Muscle weakness (generalized) (M62.81)    Time: 5997-7414 PT Time Calculation (min) (ACUTE ONLY): 15 min   Charges:   PT Evaluation $PT Eval Moderate Complexity: 1 Mod          Lorrin Goodell, PT  Office # [phone] Pager [phone]   Lorriane Shire 08/12/2019, 10:04 AM

## 2019-08-12 NOTE — Progress Notes (Signed)
PROGRESS NOTE    [name]  HTX:[id number] DOB: [birth year] DOA: 08/10/2019 PCP: [name], MD   Brief Narrative:  HPI on 10/172020 by Dr. Jennette Kettle [name] is a 85 y.o. female with medical history significant of DVT and PE on eliquis, breast cancer undergoing neoadjuvant hormone therapy, chronic CHF with EF 40-45%, CKD stage 3, sinus pauses s/p PPM.  Patient has a broken R ankle.  Patient presents to the ED after a syncopal episode today.  She was reportedly eating when she felt her vision go blurry. She called her daughter into the room who she states that she could hear talking to her but could not see.   Her daughter reportsthat her mother had just finished eating and she went to see her and her head was rolled back. She states that she was unable to wake her. She then put sugar on her finger and rubbed it inside her mother's mouth thinking that maybe the sugar was low. She reportedly "came around a little and then went out again." She gave her more sugar. She states that she was fully out for approximately 2 to 3 minutes and during this time her face and lips turned to light gray. Daughter reports that patient has been complaining of blurred vision the past 2 mornings.  Had CP in ambulance which resolved.  Interim history Patient admitted with recurrent DVT and syncope.  It seems that she has failed her outpatient Eliquis.  Discussed with oncology, recommended starting patient on Coumadin. Assessment & Plan   DVT, recurrent -Patient tells me she was on Coumadin quite a long time ago and was transitioned to Eliquis recently approximately 3 months ago -Closely provoked by a broken ankle -has not failed Eliquis  -Lower extremity Doppler acute DVT right posterior tibial veins, right peroneal veins.  -VQ scan unremarkable for PE -Currently placed on heparin -Discussed with Dr. Lindi Adie (patient's oncologist), recommended placing patient back on  coumadin.  Syncope -Possibly secondary to hypoglycemia as patient admitted to not eating much vs orthostasis -CT head shows no acute or cranial findings -Pacemaker was interrogated and no arrhythmias were noted (per H&P, however no record or report within chart) -Discussed with [employer] rep, device was interrogated, at the time that her syncopal episode occurred heart rate was between 60s and 70s.  Nothing was noted to be abnormal. -Troponins flat -V/Q as above -Orthostatic vitals were positive - feel that patient should follow up with her cardiologist regarding HTN/CHF meds- possibly being over diuresed? -Had recent Echocardiogram Sept 2020- see below  History of breast cancer -Patient follows with oncology, Dr. Lindi Adie -Continue arimidex  Diabetes mellitus, type II -Continue Levemir, insulin sliding scale, CBG monitoring  Essential hypertension -Continue Coreg, BiDil, Demadex  Chronic kidney disease, stage IIIb -Creatinine stable, currently 3.20  Chronic systolic and diastolic CHF -Echocardiogram 07/22/2019 showed EF of 40 to 23%, LV diastolic parameters consistent with impaired relaxation.  Severe hypokinesis in the distal LV. -Appears to be euvolemic and stable -Continue Demadex  Hypokalemia -continue supplementation, monitor BMP  Severe obstructive sleep apnea -Patient had sleep study done on 08/02/2019 showing severe obstructive sleep apnea, recommendations were trial of BiPAP therapy 17/13 -Order BiPAP  History of recurrent SVT/LBBB -Has pacemaker in place given that she had symptomatic daytime pauses (This occurred in [place]) -Of note patient followed up with Dr. Lovena Le, cardiology on 07/22/2019 was noted to have transient AV block with syncope and found to be asymptomatic at that time.  Her [employer] DDD  p.m. is working normally. -on amiodarone  DVT Prophylaxis  heparin  Code Status: Full  Family Communication: None at bedside  Disposition Plan: Admitted. The  patient failed therapy with Eliquis, currently on IV heparin.  Will start to bridge with Coumadin therapy.  Suspect patient will continue to need hospitalization until she is therapeutic.  May consider Lovenox however patient does have chronic kidney disease, stage IIIb at baseline.  Consultants None  Procedures  Lower extremity Doppler VQ scan  Antibiotics   Anti-infectives (From admission, onward)   None      Subjective:   [name] seen and examined today.  Patient with no complaints today. Denies current chest pain, shortness of breath, abdominal pain, N/V/D/C.   Objective:   Vitals:   08/12/19 0200 08/12/19 0400 08/12/19 0503 08/12/19 1113  BP: (!) 95/47 (!) 96/51 (!) 113/55 (!) 105/57  Pulse: 70 66 63   Resp: 14 14 17    Temp:  98.8 F (37.1 C)  98.7 F (37.1 C)  TempSrc:  Oral  Oral  SpO2: 98% 98% 97%   Weight:  77.4 kg 76.3 kg   Height:        Intake/Output Summary (Last 24 hours) at 08/12/2019 1121 Last data filed at 08/12/2019 1114 Gross per 24 hour  Intake 1347 ml  Output 2000 ml  Net -653 ml   Filed Weights   08/11/19 0403 08/12/19 0400 08/12/19 0503  Weight: 77 kg 77.4 kg 76.3 kg   Exam  General: Well developed, well nourished, NAD, appears stated age  HEENT: NCAT, mucous membranes moist.   Cardiovascular: S1 S2 auscultated, RRR  Respiratory: Clear to auscultation bilaterally   Abdomen: Soft, nontender, nondistended, + bowel sounds  Extremities: warm dry without cyanosis clubbing or edema  Neuro: AAOx3, nonfocal  Psych: Pleasant, appropriate mood and affect  Data Reviewed: I have personally reviewed following labs and imaging studies  CBC: Recent Labs  Lab 08/10/19 1441 08/11/19 0449 08/12/19 0557  WBC 3.4* 4.6 3.5*  HGB 9.5* 11.3* 9.6*  HCT 29.6* 35.1* 30.6*  MCV 96.4 94.1 95.9  PLT 108* 133* 235*   Basic Metabolic Panel: Recent Labs  Lab 08/10/19 1441 08/11/19 0449  NA 140 144  K 3.8 3.4*  CL 102 104  CO2 26 25   GLUCOSE 254* 92  BUN 40* 31*  CREATININE 1.94* 1.63*  CALCIUM 8.7* 8.8*   GFR: Estimated Creatinine Clearance: 26.2 mL/min (A) (by C-G formula based on SCr of 1.63 mg/dL (H)). Liver Function Tests: Recent Labs  Lab 08/10/19 1441  AST 38  ALT 41  ALKPHOS 97  BILITOT 0.5  PROT 6.9  ALBUMIN 3.0*   No results for input(s): LIPASE, AMYLASE in the last 168 hours. No results for input(s): AMMONIA in the last 168 hours. Coagulation Profile: Recent Labs  Lab 08/10/19 1441  INR 1.2   Cardiac Enzymes: No results for input(s): CKTOTAL, CKMB, CKMBINDEX, TROPONINI in the last 168 hours. BNP (last 3 results) No results for input(s): PROBNP in the last 8760 hours. HbA1C: No results for input(s): HGBA1C in the last 72 hours. CBG: Recent Labs  Lab 08/11/19 1207 08/11/19 1621 08/11/19 2200 08/12/19 0633 08/12/19 1117  GLUCAP 204* 76 246* 79 190*   Lipid Profile: No results for input(s): CHOL, HDL, LDLCALC, TRIG, CHOLHDL, LDLDIRECT in the last 72 hours. Thyroid Function Tests: No results for input(s): TSH, T4TOTAL, FREET4, T3FREE, THYROIDAB in the last 72 hours. Anemia Panel: No results for input(s): VITAMINB12, FOLATE, FERRITIN, TIBC, IRON, RETICCTPCT  in the last 72 hours. Urine analysis:    Component Value Date/Time   COLORURINE YELLOW 08/10/2019 1624   APPEARANCEUR HAZY (A) 08/10/2019 1624   LABSPEC 1.008 08/10/2019 1624   PHURINE 5.0 08/10/2019 1624   GLUCOSEU NEGATIVE 08/10/2019 1624   HGBUR NEGATIVE 08/10/2019 1624   BILIRUBINUR NEGATIVE 08/10/2019 1624   BILIRUBINUR negative 05/21/2019 1448   KETONESUR NEGATIVE 08/10/2019 1624   PROTEINUR NEGATIVE 08/10/2019 1624   UROBILINOGEN 0.2 05/21/2019 1448   UROBILINOGEN 0.2 10/12/2012 2037   NITRITE NEGATIVE 08/10/2019 1624   LEUKOCYTESUR LARGE (A) 08/10/2019 1624   Sepsis Labs: @LABRCNTIP (procalcitonin:4,lacticidven:4)  ) Recent Results (from the past 240 hour(s))  Urine culture     Status: Abnormal (Preliminary  result)   Collection Time: 08/10/19  4:25 PM   Specimen: Urine, Random  Result Value Ref Range Status   Specimen Description URINE, RANDOM  Final   Special Requests NONE  Final   Culture (A)  Final    >=100,000 COLONIES/mL GRAM NEGATIVE RODS IDENTIFICATION AND SUSCEPTIBILITIES TO FOLLOW Performed at [facility], [address], [place], [place] [address]    Report Status PENDING  Incomplete  SARS CORONAVIRUS 2 (TAT 6-24 HRS) Nasopharyngeal Nasopharyngeal Swab     Status: None   Collection Time: 08/10/19  6:24 PM   Specimen: Nasopharyngeal Swab  Result Value Ref Range Status   SARS Coronavirus 2 NEGATIVE NEGATIVE Final    Comment: (NOTE) SARS-CoV-2 target nucleic acids are NOT DETECTED. The SARS-CoV-2 RNA is generally detectable in upper and lower respiratory specimens during the acute phase of infection. Negative results do not preclude SARS-CoV-2 infection, do not rule out co-infections with other pathogens, and should not be used as the sole basis for treatment or other patient management decisions. Negative results must be combined with clinical observations, patient history, and epidemiological information. The expected result is Negative. Fact Sheet for Patients: SugarRoll.be Fact Sheet for Healthcare Providers: https://www.woods-mathews.com/ This test is not yet approved or cleared by the [place] FDA and  has been authorized for detection and/or diagnosis of SARS-CoV-2 by FDA under an Emergency Use Authorization (EUA). This EUA will remain  in effect (meaning this test can be used) for the duration of the COVID-19 declaration under Section 56 4(b)(1) of the Act, 21 U.S.C. section 360bbb-3(b)(1), unless the authorization is terminated or revoked sooner. Performed at [facility], [place] [address] [place], [place] [address]       Radiology Studies: Dg Lumbar Spine Complete  Result Date: 08/10/2019 CLINICAL  DATA:  85 year old female with fall and back pain. EXAM: LUMBAR SPINE - COMPLETE 4+ VIEW; PELVIS - 1-2 VIEW COMPARISON:  None. FINDINGS: There is no acute fracture or subluxation of the lumbar spine. There is osteopenia with multilevel degenerative changes. Grade 1 L4-L5 anterolisthesis. Multilevel disc desiccation and vacuum phenomena. No definite acute fracture of the pelvis identified. There is arthritic changes of the hips. The soft tissues are grossly unremarkable. Punctate radiopaque focus over the right renal silhouette may represent a tiny calculus. IMPRESSION: No acute/traumatic lumbar spine or pelvic pathology identified. Electronically Signed   By: Anner Crete M.D.   On: 08/10/2019 17:19   Dg Pelvis 1-2 Views  Result Date: 08/10/2019 CLINICAL DATA:  85 year old female with fall and back pain. EXAM: LUMBAR SPINE - COMPLETE 4+ VIEW; PELVIS - 1-2 VIEW COMPARISON:  None. FINDINGS: There is no acute fracture or subluxation of the lumbar spine. There is osteopenia with multilevel degenerative changes. Grade 1 L4-L5 anterolisthesis.  Multilevel disc desiccation and vacuum phenomena. No definite acute fracture of the pelvis identified. There is arthritic changes of the hips. The soft tissues are grossly unremarkable. Punctate radiopaque focus over the right renal silhouette may represent a tiny calculus. IMPRESSION: No acute/traumatic lumbar spine or pelvic pathology identified. Electronically Signed   By: Anner Crete M.D.   On: 08/10/2019 17:19   Ct Head Wo Contrast  Result Date: 08/10/2019 CLINICAL DATA:  Syncope, head trauma EXAM: CT HEAD WITHOUT CONTRAST TECHNIQUE: Contiguous axial images were obtained from the base of the skull through the vertex without intravenous contrast. COMPARISON:  07/13/2017, 11/06/2018 FINDINGS: Brain: No evidence of acute infarction, hemorrhage, hydrocephalus, extra-axial collection or mass lesion/mass effect. Mild cerebral volume loss, age-appropriate. Vascular:  Mild atherosclerotic calcifications involving the large vessels of the skull base. No unexpected hyperdense vessel. Skull: Normal. Negative for fracture or focal lesion. Sinuses/Orbits: Scattered bilateral ethmoid air cell opacification. Remaining paranasal sinuses and mastoid air cells are clear. Orbital structures unremarkable. Other: None. IMPRESSION: 1. No acute intracranial findings. 2. Mild ethmoid sinus disease. Electronically Signed   By: Davina Poke M.D.   On: 08/10/2019 16:54   Ct Cervical Spine Wo Contrast  Result Date: 08/10/2019 CLINICAL DATA:  Fall, syncope EXAM: CT CERVICAL SPINE WITHOUT CONTRAST TECHNIQUE: Multidetector CT imaging of the cervical spine was performed without intravenous contrast. Multiplanar CT image reconstructions were also generated. COMPARISON:  X-ray 06/08/2019 FINDINGS: Alignment: Grade 1 anterolisthesis C5 on C6 and T2 on T3. Skull base and vertebrae: No acute fracture. No suspicious bone lesion. Soft tissues and spinal canal: No prevertebral fluid or swelling. No visible canal hematoma. Disc levels: Severe degenerative changes at the C5-6 disc space and right C5-6 facet joint as well as the left C6-7 facet joint with prominent subchondral cysts versus erosions. There appears to be a moderate-sized disc protrusion at C3-4 likely resulting in at least mild canal stenosis. Upper chest: Enlarged, heterogeneous thyroid gland. Other: None. IMPRESSION: 1. No acute cervical spine fracture. 2. Severe degenerative changes at C5-6 and C6-7 with probable erosions involving the left C5-6 facet joint and right C6-7 facet joint concerning for an underlying inflammatory or crystalline arthropathy. 3. Moderate-sized disc protrusion at C3-4 likely resulting in at least mild canal stenosis. 4. Enlarged, heterogeneous thyroid gland. Electronically Signed   By: Davina Poke M.D.   On: 08/10/2019 17:06   Nm Pulmonary Vent And Perf (v/q Scan)  Result Date: 08/10/2019 CLINICAL  DATA:  Lower extremity DVT EXAM: NUCLEAR MEDICINE VENTILATION - PERFUSION LUNG SCAN TECHNIQUE: Ventilation images were obtained in multiple projections using inhaled aerosol Tc-58m DTPA. Perfusion images were obtained in multiple projections after intravenous injection of Tc-67m MAA. RADIOPHARMACEUTICALS:  38.7 mCi of Tc-83m DTPA aerosol inhalation and 1.65 mCi Tc48m MAA IV COMPARISON:  None. FINDINGS: Ventilation: No focal ventilation defect. Perfusion: No wedge shaped peripheral perfusion defects to suggest acute pulmonary embolism. IMPRESSION: Normal ventilation perfusion scan. Electronically Signed   By: Prudencio Pair M.D.   On: 08/10/2019 21:36   Dg Chest Portable 1 View  Result Date: 08/10/2019 CLINICAL DATA:  85 year old female with chest pain, blackouts. EXAM: PORTABLE CHEST 1 VIEW COMPARISON:  Portable chest 06/09/2019 and earlier. FINDINGS: Portable AP semi upright view at 1507 hours. Improved lung volumes and ventilation. Allowing for portable technique the lungs are clear. Stable left chest cardiac pacemaker. Stable mild cardiomegaly. Other mediastinal contours are within normal limits. Paucity of bowel gas in the upper abdomen. No acute osseous abnormality identified. IMPRESSION: No acute cardiopulmonary  abnormality. Electronically Signed   By: Genevie Ann M.D.   On: 08/10/2019 15:25   Vas [place] Lower Extremity Venous (dvt) (only Mc & Wl 7a-7p)  Result Date: 08/11/2019  Lower Venous Study Indications: Pain, Swelling, and broken right ankle.  Comparison Study: Prior study from 2013 is available for comparison Performing Technologist: Sharion Dove RVS  Examination Guidelines: A complete evaluation includes B-mode imaging, spectral Doppler, color Doppler, and power Doppler as needed of all accessible portions of each vessel. Bilateral testing is considered an integral part of a complete examination. Limited examinations for reoccurring indications may be performed as noted.   +---------+---------------+---------+-----------+----------+--------------+ RIGHT    CompressibilityPhasicitySpontaneityPropertiesThrombus Aging +---------+---------------+---------+-----------+----------+--------------+ CFV      Full           Yes      Yes                                 +---------+---------------+---------+-----------+----------+--------------+ SFJ      Full                                                        +---------+---------------+---------+-----------+----------+--------------+ FV Prox  Full                                                        +---------+---------------+---------+-----------+----------+--------------+ FV Mid   Full                                                        +---------+---------------+---------+-----------+----------+--------------+ FV DistalFull                                                        +---------+---------------+---------+-----------+----------+--------------+ PFV      Full                                                        +---------+---------------+---------+-----------+----------+--------------+ POP      Full           Yes      Yes                                 +---------+---------------+---------+-----------+----------+--------------+ PTV      None                                         Acute          +---------+---------------+---------+-----------+----------+--------------+ PERO     None  Acute          +---------+---------------+---------+-----------+----------+--------------+   +----+---------------+---------+-----------+----------+--------------+ LEFTCompressibilityPhasicitySpontaneityPropertiesThrombus Aging +----+---------------+---------+-----------+----------+--------------+ CFV Full           Yes      Yes                                  +----+---------------+---------+-----------+----------+--------------+     Summary: Right: Findings consistent with acute deep vein thrombosis involving the right posterior tibial veins, and right peroneal veins. Patient had SVT in varicosities in 2013 and chronic SVT at the [facility] which appear resolved Left: No evidence of common femoral vein obstruction.  *See table(s) above for measurements and observations. Electronically signed by Harold Barban MD on 08/11/2019 at 4:35:52 PM.    Final      Scheduled Meds: . amiodarone  200 mg Oral Daily  . anastrozole  1 mg Oral Daily  . atorvastatin  20 mg Oral QHS  . calcium carbonate  1,250 mg Oral Q breakfast  . carvedilol  12.5 mg Oral BID WC  . diclofenac sodium  2 g Topical QHS  . ferrous sulfate  325 mg Oral Q breakfast  . influenza vaccine adjuvanted  0.5 mL Intramuscular Tomorrow-1000  . insulin aspart  0-9 Units Subcutaneous TID WC  . insulin aspart  3 Units Subcutaneous TID WC  . insulin detemir  16 Units Subcutaneous Daily  . insulin detemir  8 Units Subcutaneous QHS  . isosorbide-hydrALAZINE  1 tablet Oral TID  . magnesium oxide  400 mg Oral Daily  . oxybutynin  5 mg Oral BID  . pantoprazole  80 mg Oral Daily  . potassium chloride SA  40 mEq Oral Daily  . pregabalin  75 mg Oral QHS  . sodium chloride flush  3 mL Intravenous Q12H  . torsemide  20 mg Oral QPM  . torsemide  40 mg Oral Daily   Continuous Infusions: . heparin 1,450 Units/hr (08/12/19 0922)     LOS: 1 day   Time Spent in minutes   45 minutes (greater than 50% of time spent with patient face to face, as well as reviewing old records, calling consults, and formulating a plan)   Alphonza Tramell D.O. on 08/12/2019 at 11:21 AM  Between 7am to 7pm - Please see pager noted on [email or web address]  After 7pm go to [email or web address]  And look for the night coverage person covering for me after hours  [facility] Office  [phone]

## 2019-08-12 NOTE — Progress Notes (Addendum)
Pt refused BiPAP, monitor when she 's sleeping, SPO2 98-99% on room air, no decreasing of O2 saturation. Her EKG was Atrial paced on monitor, HR 60s, BP 92/48-126/57 mmHg at night. No acute distress noted. Continue to monitor.  [name] [id number],OI

## 2019-08-13 DIAGNOSIS — R55 Syncope and collapse: Secondary | ICD-10-CM | POA: Diagnosis not present

## 2019-08-13 DIAGNOSIS — E1122 Type 2 diabetes mellitus with diabetic chronic kidney disease: Secondary | ICD-10-CM | POA: Diagnosis not present

## 2019-08-13 DIAGNOSIS — I824Z1 Acute embolism and thrombosis of unspecified deep veins of right distal lower extremity: Secondary | ICD-10-CM | POA: Diagnosis not present

## 2019-08-13 DIAGNOSIS — N183 Chronic kidney disease, stage 3 unspecified: Secondary | ICD-10-CM | POA: Diagnosis not present

## 2019-08-13 LAB — HEPARIN LEVEL (UNFRACTIONATED): Heparin Unfractionated: 0.97 IU/mL — ABNORMAL HIGH (ref 0.30–0.70)

## 2019-08-13 LAB — URINE CULTURE: Culture: >=100000 — AB

## 2019-08-13 LAB — GLUCOSE, CAPILLARY
Glucose-Capillary: 97 mg/dL (ref 70–99)
Glucose-Capillary: 252 mg/dL — ABNORMAL HIGH (ref 70–99)
Glucose-Capillary: 298 mg/dL — ABNORMAL HIGH (ref 70–99)
Glucose-Capillary: 100 mg/dL — ABNORMAL HIGH (ref 70–99)

## 2019-08-13 LAB — BASIC METABOLIC PANEL
Anion gap: 9 (ref 5–15)
BUN: 37 mg/dL — ABNORMAL HIGH (ref 8–23)
CO2: 28 mmol/L (ref 22–32)
Calcium: 8.7 mg/dL — ABNORMAL LOW (ref 8.9–10.3)
Chloride: 103 mmol/L (ref 98–111)
Creatinine, Ser: 1.96 mg/dL — ABNORMAL HIGH (ref 0.44–1.00)
GFR calc Af Amer: 27 mL/min — ABNORMAL LOW (ref >60)
GFR calc non Af Amer: 23 mL/min — ABNORMAL LOW (ref >60)
Glucose, Bld: 140 mg/dL — ABNORMAL HIGH (ref 70–99)
Potassium: 3.9 mmol/L (ref 3.5–5.1)
Sodium: 140 mmol/L (ref 135–145)

## 2019-08-13 LAB — APTT: aPTT: 76 seconds — ABNORMAL HIGH (ref 24–36)

## 2019-08-13 LAB — PROTIME-INR
INR: 1.1 (ref 0.8–1.2)
Prothrombin Time: 14.2 seconds (ref 11.4–15.2)

## 2019-08-13 LAB — HEMOGLOBIN AND HEMATOCRIT, BLOOD
HCT: 28.5 % — ABNORMAL LOW (ref 36.0–46.0)
Hemoglobin: 9.2 g/dL — ABNORMAL LOW (ref 12.0–15.0)

## 2019-08-13 MED ORDER — CEFDINIR 300 MG PO CAPS
300.0000 mg | ORAL_CAPSULE | Freq: Every day | ORAL | Status: AC
  Administered 2019-08-13 – 2019-08-17 (×5): 300 mg via ORAL
  Filled 2019-08-13 (×5): qty 1

## 2019-08-13 MED ORDER — SODIUM CHLORIDE 0.9 % IV SOLN
510.0000 mg | Freq: Once | INTRAVENOUS | Status: AC
  Administered 2019-08-13: 510 mg via INTRAVENOUS
  Filled 2019-08-13: qty 17

## 2019-08-13 MED ORDER — WARFARIN SODIUM 5 MG PO TABS
5.0000 mg | ORAL_TABLET | Freq: Once | ORAL | Status: AC
  Administered 2019-08-13: 5 mg via ORAL
  Filled 2019-08-13: qty 1

## 2019-08-13 NOTE — Progress Notes (Signed)
Physical Therapy Treatment Patient Details Name: [name] MRN: [id number] DOB: December 25, 1932 Today's Date: 08/13/2019    History of Present Illness 85 y.o. female with medical history significant of DVT and PE on eliquis, breast cancer undergoing neoadjuvant hormone therapy, chronic CHF with EF 40-45%, CKD stage 3, sinus pauses s/p PPM. She presented to the ED after a syncopal episode at home. R ankle fx diagnosed 07/09/19. Doppler revealed acute DVT R distal LE.    PT Comments    Patient seen for mobility progression. Pt requires mod A to stand and min A (+2 for chair follow/safety) for gait training distance of 35-30 ft X 2 trials. Pt required seated rest break due to fatigue and SOB. VSS. Noted ankle fracture and supraspinatus tear diagnosed 07/09/19. Pt tolerated mobility without c/o increased pain. Continue to progress as tolerated.     Follow Up Recommendations  Home health PT;Supervision/Assistance - 24 hour     Equipment Recommendations  None recommended by PT    Recommendations for Other Services       Precautions / Restrictions Precautions Precautions: Fall Precaution Comments: L supraspinatus tear also noted from orthopedic note on 07/09/19 Required Braces or Orthoses: Other Brace Other Brace: air cast RLE for transfers/ambulation Restrictions Weight Bearing Restrictions: Yes RLE Weight Bearing: Weight bearing as tolerated Other Position/Activity Restrictions: per Dr. Lorin Mercy 07/09/19 WBAT in air cast RLE and limit ambulation    Mobility  Bed Mobility               General bed mobility comments: pt OOB in chair upon arrival   Transfers Overall transfer level: Needs assistance Equipment used: Rolling walker (2 wheeled) Transfers: Sit to/from Stand Sit to Stand: Mod assist         General transfer comment: cues for safe hand placement; assist to power up into standing from recliner and commode using grab bar  Ambulation/Gait Ambulation/Gait assistance:  Min assist;+2 safety/equipment(chair follow) Gait Distance (Feet): (25-30 ft X 2 trials with seated rest break) Assistive device: Rolling walker (2 wheeled) Gait Pattern/deviations: Step-through pattern;Decreased step length - right;Decreased step length - left;Shuffle;Trunk flexed;Wide base of support     General Gait Details: cues for safe use of AD and upright posture; seated break due to fatigue and pt feeling SOB; VSS   Stairs             Wheelchair Mobility    Modified Rankin (Stroke Patients Only)       Balance Overall balance assessment: Needs assistance;History of Falls Sitting-balance support: No upper extremity supported;Feet supported Sitting balance-Leahy Scale: Fair     Standing balance support: Bilateral upper extremity supported;During functional activity Standing balance-Leahy Scale: Poor                              Cognition Arousal/Alertness: Awake/alert Behavior During Therapy: WFL for tasks assessed/performed Overall Cognitive Status: Within Functional Limits for tasks assessed                                        Exercises      General Comments        Pertinent Vitals/Pain Pain Assessment: No/denies pain    Home Living                      Prior Function  PT Goals (current goals can now be found in the care plan section) Acute Rehab PT Goals Patient Stated Goal: home Progress towards PT goals: Progressing toward goals    Frequency    Min 3X/week      PT Plan Current plan remains appropriate    Co-evaluation              AM-PAC PT "6 Clicks" Mobility   Outcome Measure  Help needed turning from your back to your side while in a flat bed without using bedrails?: A Little Help needed moving from lying on your back to sitting on the side of a flat bed without using bedrails?: A Little Help needed moving to and from a bed to a chair (including a wheelchair)?: A  Little Help needed standing up from a chair using your arms (e.g., wheelchair or bedside chair)?: A Lot Help needed to walk in hospital room?: A Little Help needed climbing 3-5 steps with a railing? : A Lot 6 Click Score: 16    End of Session Equipment Utilized During Treatment: Gait belt Activity Tolerance: Patient tolerated treatment well Patient left: in chair;with call bell/phone within reach Nurse Communication: Mobility status PT Visit Diagnosis: Other abnormalities of gait and mobility (R26.89);History of falling (Z91.81);Muscle weakness (generalized) (M62.81)     Time: 1331-1400 PT Time Calculation (min) (ACUTE ONLY): 29 min  Charges:  $Gait Training: 23-37 mins                     Earney Navy, PTA Acute Rehabilitation Services Pager: [phone] Office: [phone]     Darliss Cheney 08/13/2019, 2:18 PM

## 2019-08-13 NOTE — Progress Notes (Signed)
Pt is seen by me in the office and we were going to arrange OP iron.  We found out she was in hospital so I ordered her iron infusion to be given now   Louis Meckel

## 2019-08-13 NOTE — Progress Notes (Addendum)
PROGRESS NOTE    [name]  IOX:[id number] DOB: October 15, 1933 DOA: 08/10/2019 PCP: [name], MD   Brief Narrative:  HPI on 10/172020 by Dr. Jennette Kettle Stephanie NGHIEM is a 85 y.o. female with medical history significant of DVT and PE on eliquis, breast cancer undergoing neoadjuvant hormone therapy, chronic CHF with EF 40-45%, CKD stage 3, sinus pauses s/p PPM.  Patient has a broken R ankle.  Patient presents to the ED after a syncopal episode today.  She was reportedly eating when she felt her vision go blurry. She called her daughter into the room who she states that she could hear talking to her but could not see.   Her daughter reportsthat her mother had just finished eating and she went to see her and her head was rolled back. She states that she was unable to wake her. She then put sugar on her finger and rubbed it inside her mother's mouth thinking that maybe the sugar was low. She reportedly "came around a little and then went out again." She gave her more sugar. She states that she was fully out for approximately 2 to 3 minutes and during this time her face and lips turned to light gray. Daughter reports that patient has been complaining of blurred vision the past 2 mornings.  Had CP in ambulance which resolved.  Interim history Patient admitted with recurrent DVT and syncope.  It seems that she has failed her outpatient Eliquis.  Discussed with oncology, recommended starting patient on Coumadin. Assessment & Plan   DVT, recurrent -Patient tells me she was on Coumadin quite a long time ago and was transitioned to Eliquis recently approximately 3 months ago -Closely provoked by a broken ankle -Lower extremity Doppler acute DVT right posterior tibial veins, right peroneal veins.  -VQ scan unremarkable for PE -Currently placed on heparin -Discussed with Dr. Lindi Adie (patient's oncologist), recommended placing patient back on coumadin.  Currently INR 1.1 -Not sure  if patient has failed Eliquis or was not taking the medications properly.  Discussed with her daughter via phone, she states that she does arrange patient's medications for her but is not sure if she takes all of them.  Syncope -Possibly secondary to hypoglycemia as patient admitted to not eating much vs orthostasis -CT head shows no acute or cranial findings -Pacemaker was interrogated and no arrhythmias were noted (per H&P, however no record or report within chart) -Discussed with [employer] rep, device was interrogated, at the time that her syncopal episode occurred heart rate was between 60s and 70s.  Nothing was noted to be abnormal. -Troponins flat -V/Q as above -Orthostatic vitals were positive - feel that patient should follow up with her cardiologist regarding HTN/CHF meds- possibly being over diuresed?  Have decreased patient's Demadex dose and ordered TED hose -Had recent Echocardiogram Sept 2020- see below  History of breast cancer -Patient follows with oncology, Dr. Lindi Adie -Continue arimidex  Diabetes mellitus, type II -Continue Levemir, insulin sliding scale, CBG monitoring  Essential hypertension -Continue Coreg, BiDil, Demadex  Chronic kidney disease, stage IIIb -Creatinine stable, currently 1.96 -Continue to monitor BMP  Chronic systolic and diastolic CHF -Echocardiogram 07/22/2019 showed EF of 40 to 26%, LV diastolic parameters consistent with impaired relaxation.  Severe hypokinesis in the distal LV. -Appears to be euvolemic and stable -Continue Demadex  Hypokalemia -Solved, continue to monitor BMP  Severe obstructive sleep apnea -Patient had sleep study done on 08/02/2019 showing severe obstructive sleep apnea, recommendations were trial of BiPAP therapy  17/13 -Continue BiPAP  History of recurrent SVT/LBBB -Has pacemaker in place given that she had symptomatic daytime pauses (This occurred in [place]) -Of note patient followed up with Dr. Lovena Le, cardiology  on 07/22/2019 was noted to have transient AV block with syncope and found to be asymptomatic at that time.  Her [employer] DDD p.m. is working normally. -on amiodarone  UTI -Urine culture Enterobacter hormaechei -will place on cefdinir 300mg  daily (renally adjusted)  Normocytic anemia -Upon review of chart, baseline hemoglobin approximately 8-10 -Hemoglobin currently 9.2, continue to monitor  Deconditioning -PT consulted recommending home health physical therapy/supervision 24 hours -Discussed with patient's daughter, unfortunately she is unable to provide 24-hour care -Social Work consulted for SNF  DVT Prophylaxis  Heparin/Coumadin  Code Status: Full  Family Communication: None at bedside. Daughter via phone  Disposition Plan: Admitted. The patient failed therapy with Eliquis, currently on IV heparin. Given renal function, feel the patient should remain in the hospital and be bridged to Coumadin therapy.  Would also like to look at SNF placement given the daughter cannot provide 24-hour supervision at home.    Consultants Oncology, Dr. Lindi Adie, via phone  Procedures  Lower extremity Doppler VQ scan  Antibiotics   Anti-infectives (From admission, onward)   None      Subjective:   [name] seen and examined today.  Patient with no complaints today.  Denies current chest pain, shortness of breath, dumping, nausea vomiting, diarrhea constipation, dizziness or headache. Objective:   Vitals:   08/13/19 0000 08/13/19 0200 08/13/19 0436 08/13/19 0800  BP: (!) 98/51  132/62 (!) 129/58  Pulse: 66  62 69  Resp: (!) 28 19 18 19   Temp:   98.7 F (37.1 C)   TempSrc:   Oral   SpO2: 97%  99% 95%  Weight:   80.8 kg   Height:        Intake/Output Summary (Last 24 hours) at 08/13/2019 1054 Last data filed at 08/13/2019 0556 Gross per 24 hour  Intake 974.98 ml  Output 950 ml  Net 24.98 ml   Filed Weights   08/12/19 0400 08/12/19 0503 08/13/19 0436  Weight: 77.4 kg 76.3  kg 80.8 kg   Exam  General: Well developed, well nourished, NAD, appears stated age  77: NCAT, mucous membranes moist.   Cardiovascular: S1 S2 auscultated, RRR  Respiratory: Clear to auscultation bilaterally  Abdomen: Soft, nontender, nondistended, + bowel sounds  Extremities: warm dry without cyanosis clubbing   Neuro: AAOx3, nonfocal  Psych: Pleasant, appropriate mood and affect   Data Reviewed: I have personally reviewed following labs and imaging studies  CBC: Recent Labs  Lab 08/10/19 1441 08/11/19 0449 08/12/19 0557 08/13/19 0337  WBC 3.4* 4.6 3.5*  --   HGB 9.5* 11.3* 9.6* 9.2*  HCT 29.6* 35.1* 30.6* 28.5*  MCV 96.4 94.1 95.9  --   PLT 108* 133* 112*  --    Basic Metabolic Panel: Recent Labs  Lab 08/10/19 1441 08/11/19 0449 08/13/19 0337  NA 140 144 140  K 3.8 3.4* 3.9  CL 102 104 103  CO2 26 25 28   GLUCOSE 254* 92 140*  BUN 40* 31* 37*  CREATININE 1.94* 1.63* 1.96*  CALCIUM 8.7* 8.8* 8.7*   GFR: Estimated Creatinine Clearance: 22.4 mL/min (A) (by C-G formula based on SCr of 1.96 mg/dL (H)). Liver Function Tests: Recent Labs  Lab 08/10/19 1441  AST 38  ALT 41  ALKPHOS 97  BILITOT 0.5  PROT 6.9  ALBUMIN 3.0*  No results for input(s): LIPASE, AMYLASE in the last 168 hours. No results for input(s): AMMONIA in the last 168 hours. Coagulation Profile: Recent Labs  Lab 08/10/19 1441 08/13/19 0337  INR 1.2 1.1   Cardiac Enzymes: No results for input(s): CKTOTAL, CKMB, CKMBINDEX, TROPONINI in the last 168 hours. BNP (last 3 results) No results for input(s): PROBNP in the last 8760 hours. HbA1C: No results for input(s): HGBA1C in the last 72 hours. CBG: Recent Labs  Lab 08/12/19 0633 08/12/19 1117 08/12/19 1612 08/12/19 2147 08/13/19 0620  GLUCAP 79 190* 129* 102* 97   Lipid Profile: No results for input(s): CHOL, HDL, LDLCALC, TRIG, CHOLHDL, LDLDIRECT in the last 72 hours. Thyroid Function Tests: No results for input(s):  TSH, T4TOTAL, FREET4, T3FREE, THYROIDAB in the last 72 hours. Anemia Panel: No results for input(s): VITAMINB12, FOLATE, FERRITIN, TIBC, IRON, RETICCTPCT in the last 72 hours. Urine analysis:    Component Value Date/Time   COLORURINE YELLOW 08/10/2019 1624   APPEARANCEUR HAZY (A) 08/10/2019 1624   LABSPEC 1.008 08/10/2019 1624   PHURINE 5.0 08/10/2019 1624   GLUCOSEU NEGATIVE 08/10/2019 1624   HGBUR NEGATIVE 08/10/2019 1624   BILIRUBINUR NEGATIVE 08/10/2019 1624   BILIRUBINUR negative 05/21/2019 1448   KETONESUR NEGATIVE 08/10/2019 1624   PROTEINUR NEGATIVE 08/10/2019 1624   UROBILINOGEN 0.2 05/21/2019 1448   UROBILINOGEN 0.2 10/12/2012 2037   NITRITE NEGATIVE 08/10/2019 1624   LEUKOCYTESUR LARGE (A) 08/10/2019 1624   Sepsis Labs: @LABRCNTIP (procalcitonin:4,lacticidven:4)  ) Recent Results (from the past 240 hour(s))  Urine culture     Status: Abnormal   Collection Time: 08/10/19  4:25 PM   Specimen: Urine, Random  Result Value Ref Range Status   Specimen Description URINE, RANDOM  Final   Special Requests   Final    NONE Performed at [facility], [place] [address] [place], [place] [address]    Culture >=100,000 COLONIES/mL ENTEROBACTER HORMAECHEI (A)  Final   Report Status 08/13/2019 FINAL  Final   Organism ID, Bacteria ENTEROBACTER HORMAECHEI (A)  Final      Susceptibility   Enterobacter hormaechei - MIC*    CEFAZOLIN >=64 RESISTANT Resistant     CEFTRIAXONE <=1 SENSITIVE Sensitive     CIPROFLOXACIN <=0.25 SENSITIVE Sensitive     GENTAMICIN <=1 SENSITIVE Sensitive     IMIPENEM 0.5 SENSITIVE Sensitive     NITROFURANTOIN 32 SENSITIVE Sensitive     TRIMETH/SULFA <=20 SENSITIVE Sensitive     PIP/TAZO <=4 SENSITIVE Sensitive     * >=100,000 COLONIES/mL ENTEROBACTER HORMAECHEI  SARS CORONAVIRUS 2 (TAT 6-24 HRS) Nasopharyngeal Nasopharyngeal Swab     Status: None   Collection Time: 08/10/19  6:24 PM   Specimen: Nasopharyngeal Swab  Result Value Ref Range Status    SARS Coronavirus 2 NEGATIVE NEGATIVE Final    Comment: (NOTE) SARS-CoV-2 target nucleic acids are NOT DETECTED. The SARS-CoV-2 RNA is generally detectable in upper and lower respiratory specimens during the acute phase of infection. Negative results do not preclude SARS-CoV-2 infection, do not rule out co-infections with other pathogens, and should not be used as the sole basis for treatment or other patient management decisions. Negative results must be combined with clinical observations, patient history, and epidemiological information. The expected result is Negative. Fact Sheet for Patients: SugarRoll.be Fact Sheet for Healthcare Providers: https://www.woods-mathews.com/ This test is not yet approved or cleared by the [place] FDA and  has been authorized for detection and/or diagnosis of SARS-CoV-2 by FDA under an Emergency Use Authorization (EUA).  This EUA will remain  in effect (meaning this test can be used) for the duration of the COVID-19 declaration under Section 56 4(b)(1) of the Act, 21 U.S.C. section 360bbb-3(b)(1), unless the authorization is terminated or revoked sooner. Performed at [facility], [place] [address] [place], [place] [address]       Radiology Studies: No results found.   Scheduled Meds: . amiodarone  200 mg Oral Daily  . anastrozole  1 mg Oral Daily  . atorvastatin  20 mg Oral QHS  . calcium carbonate  1,250 mg Oral Q breakfast  . carvedilol  12.5 mg Oral BID WC  . diclofenac sodium  2 g Topical QHS  . ferrous sulfate  325 mg Oral Q breakfast  . influenza vaccine adjuvanted  0.5 mL Intramuscular Tomorrow-1000  . insulin aspart  0-9 Units Subcutaneous TID WC  . insulin aspart  3 Units Subcutaneous TID WC  . insulin detemir  16 Units Subcutaneous Daily  . insulin detemir  8 Units Subcutaneous QHS  . isosorbide-hydrALAZINE  1 tablet Oral TID  . magnesium oxide  400 mg Oral Daily  . oxybutynin   5 mg Oral BID  . pantoprazole  80 mg Oral Daily  . potassium chloride SA  40 mEq Oral Daily  . pregabalin  75 mg Oral QHS  . sodium chloride flush  3 mL Intravenous Q12H  . torsemide  20 mg Oral QPM  . warfarin  5 mg Oral ONCE-1800  . Warfarin - Pharmacist Dosing Inpatient   Does not apply q1800   Continuous Infusions: . heparin 1,450 Units/hr (08/13/19 1050)     LOS: 2 days   Time Spent in minutes   45 minutes (greater than 50% of time spent with patient face to face, as well as reviewing old records, discussing with daughter via phone, and formulating a plan)   Cristal Ford D.O. on 08/13/2019 at 10:54 AM  Between 7am to 7pm - Please see pager noted on [email or web address]  After 7pm go to [email or web address]  And look for the night coverage person covering for me after hours  [facility] Office  [phone]

## 2019-08-13 NOTE — Progress Notes (Addendum)
ANTICOAGULATION CONSULT NOTE - Follow Up Consult  Pharmacy Consult for heparin / Warfarin Indication: DVT  Labs: Recent Labs    08/10/19 1441 08/10/19 1729 08/10/19 1834 08/10/19 2209  08/11/19 0449 08/11/19 1456 08/12/19 0557 08/13/19 0337  HGB 9.5*  --   --   --   --  11.3*  --  9.6* 9.2*  HCT 29.6*  --   --   --   --  35.1*  --  30.6* 28.5*  PLT 108*  --   --   --   --  133*  --  112*  --   APTT  --   --   --   --    < > 58* 78* 58* 76*  LABPROT 14.9  --   --   --   --   --   --   --  14.2  INR 1.2  --   --   --   --   --   --   --  1.1  HEPARINUNFRC  --   --   --   --   --  0.84*  --  0.89* 0.97*  CREATININE 1.94*  --   --   --   --  1.63*  --   --  1.96*  TROPONINIHS 42* 39* 37* 46*  --   --   --   --   --    < > = values in this interval not displayed.    Assessment: 85yo female continues on heparin / warfarin PTT therapeutic / heparin level not yet correlating   Warfarin started 10/19 Day 2 of 5 of overlap   Goal of Therapy:  aPTT 66-102 seconds  INR = 2 to 3   Plan:  Continue heparin at 1450 units / hr Repeat Warfarin 5 mg po x 1 dose tonight Daily heparin level, PTT, CBC, INR  Thank you Anette Guarneri, PharmD  08/13/2019,7:52 AM

## 2019-08-13 NOTE — Progress Notes (Signed)
Per discussion with pharmacy, heparin drip paused for approx 15 mins while feraheme transfused.  Pt tolerated well.  Has spent all afternoon in the chair dosing, eating all meals.  Will con't plan of care.

## 2019-08-13 NOTE — Progress Notes (Addendum)
Alert and oriented x 4, vital signs remained stable, afebrile, Spo2 97-99% with room air, auscultated bilateral lungs clear. Pt refused BiPAP. Atrial paced on monitor, HR 60s. BP stable.   Continue Heparin at 1450 unit/hr. Her right leg with 1+ pitting edema, she denied pain, requested to removed cast on right ankle at night. Strong Dorsalis Pedis and Posterior Tibial pulses both lower extremities.  She slept well tonight. No evidents of acute distress noted. Will continue to monitor.  Kennyth Lose, RN

## 2019-08-14 DIAGNOSIS — I824Z1 Acute embolism and thrombosis of unspecified deep veins of right distal lower extremity: Secondary | ICD-10-CM | POA: Diagnosis not present

## 2019-08-14 DIAGNOSIS — N183 Chronic kidney disease, stage 3 unspecified: Secondary | ICD-10-CM | POA: Diagnosis not present

## 2019-08-14 DIAGNOSIS — I10 Essential (primary) hypertension: Secondary | ICD-10-CM | POA: Diagnosis not present

## 2019-08-14 LAB — BASIC METABOLIC PANEL
Anion gap: 8 (ref 5–15)
BUN: 36 mg/dL — ABNORMAL HIGH (ref 8–23)
CO2: 26 mmol/L (ref 22–32)
Calcium: 8.7 mg/dL — ABNORMAL LOW (ref 8.9–10.3)
Chloride: 106 mmol/L (ref 98–111)
Creatinine, Ser: 1.83 mg/dL — ABNORMAL HIGH (ref 0.44–1.00)
GFR calc Af Amer: 29 mL/min — ABNORMAL LOW (ref >60)
GFR calc non Af Amer: 25 mL/min — ABNORMAL LOW (ref >60)
Glucose, Bld: 93 mg/dL (ref 70–99)
Potassium: 3.9 mmol/L (ref 3.5–5.1)
Sodium: 140 mmol/L (ref 135–145)

## 2019-08-14 LAB — CBC
HCT: 27.1 % — ABNORMAL LOW (ref 36.0–46.0)
Hemoglobin: 8.7 g/dL — ABNORMAL LOW (ref 12.0–15.0)
MCH: 30.5 pg (ref 26.0–34.0)
MCHC: 32.1 g/dL (ref 30.0–36.0)
MCV: 95.1 fL (ref 80.0–100.0)
Platelets: 108 10*3/uL — ABNORMAL LOW (ref 150–400)
RBC: 2.85 MIL/uL — ABNORMAL LOW (ref 3.87–5.11)
RDW: 15 % (ref 11.5–15.5)
WBC: 3 10*3/uL — ABNORMAL LOW (ref 4.0–10.5)
nRBC: 0 % (ref 0.0–0.2)

## 2019-08-14 LAB — GLUCOSE, CAPILLARY
Glucose-Capillary: 88 mg/dL (ref 70–99)
Glucose-Capillary: 290 mg/dL — ABNORMAL HIGH (ref 70–99)
Glucose-Capillary: 179 mg/dL — ABNORMAL HIGH (ref 70–99)
Glucose-Capillary: 163 mg/dL — ABNORMAL HIGH (ref 70–99)

## 2019-08-14 LAB — PROTIME-INR
INR: 1.1 (ref 0.8–1.2)
Prothrombin Time: 14.5 seconds (ref 11.4–15.2)

## 2019-08-14 LAB — APTT: aPTT: 118 seconds — ABNORMAL HIGH (ref 24–36)

## 2019-08-14 LAB — HEPARIN LEVEL (UNFRACTIONATED): Heparin Unfractionated: 1.02 IU/mL — ABNORMAL HIGH (ref 0.30–0.70)

## 2019-08-14 MED ORDER — SENNOSIDES-DOCUSATE SODIUM 8.6-50 MG PO TABS
2.0000 | ORAL_TABLET | Freq: Two times a day (BID) | ORAL | Status: DC
  Administered 2019-08-14 – 2019-08-17 (×4): 2 via ORAL
  Filled 2019-08-14 (×9): qty 2

## 2019-08-14 MED ORDER — WARFARIN SODIUM 7.5 MG PO TABS
7.5000 mg | ORAL_TABLET | Freq: Once | ORAL | Status: AC
  Administered 2019-08-14: 7.5 mg via ORAL
  Filled 2019-08-14: qty 1

## 2019-08-14 MED ORDER — POLYETHYLENE GLYCOL 3350 17 G PO PACK
17.0000 g | PACK | Freq: Every day | ORAL | Status: DC
  Administered 2019-08-15 – 2019-08-17 (×2): 17 g via ORAL
  Filled 2019-08-14 (×5): qty 1

## 2019-08-14 NOTE — Care Management Important Message (Signed)
Important Message  Patient Details  Name: KENISE BARRACO MRN: [id number] Date of Birth: 1933/02/07   Medicare Important Message Given:  Yes     [name] 08/14/2019, 11:43 AM

## 2019-08-14 NOTE — Procedures (Signed)
Patient declined Bipap tonight.

## 2019-08-14 NOTE — Progress Notes (Signed)
Patient has refused CPAP multiple nights in a row.  CPAP machine removed from patients room.

## 2019-08-14 NOTE — Progress Notes (Signed)
PROGRESS NOTE    [name]  KVQ:[id number] DOB: [birth year] DOA: 08/10/2019 PCP: [name], MD    Brief Narrative:  85 year old lady with prior h/o systolic heart failure, stage 3 CKD, s/p PPM,  Breast cancer s/p neo adjuvant hormone therapy, DVT, PE on eliquis, presents with syncope.   She is found to have recurrent DVT and syncope.    Assessment & Plan:   Principal Problem:   DVT (deep venous thrombosis) (HCC) Active Problems:   CKD (chronic kidney disease), stage III (HCC)   HTN (hypertension)   DM (diabetes mellitus) ([place])   Malignant neoplasm of lower-inner quadrant of right breast of female, estrogen receptor positive ([place])   Syncope   Recurrent DVT:  ? NON COMPLIANCE TO eliquis vs medication failure.  Restarted her on coumadin. And also on IV heparin    Syncope:  Sec to hypoglycemia vs orthostatic hypotension.  CT head negative.  PPM interrogated and no arrhythmias.  Decreased the dose of torsemide and TED ordered.    H/o breast cancer:  Follows up with Dr Lindi Adie.    Type 2 DM: CBG (last 3)  Recent Labs    08/14/19 0617 08/14/19 1234 08/14/19 1652  GLUCAP 88 290* 179*   CONTINUE WITH SSI.    Hypertension;  Well controlled.    Stage 3 CKD:  Creatinine at baseline.   Chronic systolic and diastolic heart failure.  She appears to be euvolemic.  Continue with demadex.    Hypokalemia:  Replaced.    OSA: bipap    H/o recurrent SVT/ LBBB: S/P PPM.    Enterobacter UTI:  Complete the course of cefnidir.     DVT prophylaxis: coumadin .  Code Status: full code.  Family Communication: none at bedside.  Disposition Plan: pending therapeutic INR.    Consultants:   NONE.    Procedures: NONE.    Antimicrobials: ON CEFNIDIR.    Subjective: No BM since 3 days. No chest pain or sob , no nausea, vomiting or abdominal pain.   Objective: Vitals:   08/14/19 0457 08/14/19 0903 08/14/19 1236 08/14/19 1656  BP: (!) 125/55  113/62 126/63 119/75  Pulse: 65 64 64 63  Resp: 19 18 19 18   Temp: 98.7 F (37.1 C) 99 F (37.2 C) 98.3 F (36.8 C) 99 F (37.2 C)  TempSrc: Oral Oral Oral Oral  SpO2: 96% 97% 96% 96%  Weight: 78.3 kg     Height:        Intake/Output Summary (Last 24 hours) at 08/14/2019 1705 Last data filed at 08/14/2019 0505 Gross per 24 hour  Intake -  Output 1000 ml  Net -1000 ml   Filed Weights   08/12/19 0503 08/13/19 0436 08/14/19 0457  Weight: 76.3 kg 80.8 kg 78.3 kg    Examination:  General exam: Appears calm and comfortable  Respiratory system: Clear to auscultation. Respiratory effort normal. Cardiovascular system: S1 & S2 heard, RRR.  Gastrointestinal system: Abdomen is nondistended, soft and nontender. No organomegaly or masses felt. Normal bowel sounds heard. Central nervous system: Alert and oriented. No focal neurological deficits. Extremities: Symmetric 5 x 5 power. Skin: No rashes, lesions or ulcers Psychiatry: Mood & affect appropriate.     Data Reviewed: I have personally reviewed following labs and imaging studies  CBC: Recent Labs  Lab 08/10/19 1441 08/11/19 0449 08/12/19 0557 08/13/19 0337 08/14/19 0329  WBC 3.4* 4.6 3.5*  --  3.0*  HGB 9.5* 11.3* 9.6* 9.2* 8.7*  HCT 29.6* 35.1*  30.6* 28.5* 27.1*  MCV 96.4 94.1 95.9  --  95.1  PLT 108* 133* 112*  --  397*   Basic Metabolic Panel: Recent Labs  Lab 08/10/19 1441 08/11/19 0449 08/13/19 0337 08/14/19 0329  NA 140 144 140 140  K 3.8 3.4* 3.9 3.9  CL 102 104 103 106  CO2 26 25 28 26   GLUCOSE 254* 92 140* 93  BUN 40* 31* 37* 36*  CREATININE 1.94* 1.63* 1.96* 1.83*  CALCIUM 8.7* 8.8* 8.7* 8.7*   GFR: Estimated Creatinine Clearance: 23.7 mL/min (A) (by C-G formula based on SCr of 1.83 mg/dL (H)). Liver Function Tests: Recent Labs  Lab 08/10/19 1441  AST 38  ALT 41  ALKPHOS 97  BILITOT 0.5  PROT 6.9  ALBUMIN 3.0*   No results for input(s): LIPASE, AMYLASE in the last 168 hours. No  results for input(s): AMMONIA in the last 168 hours. Coagulation Profile: Recent Labs  Lab 08/10/19 1441 08/13/19 0337 08/14/19 0329  INR 1.2 1.1 1.1   Cardiac Enzymes: No results for input(s): CKTOTAL, CKMB, CKMBINDEX, TROPONINI in the last 168 hours. BNP (last 3 results) No results for input(s): PROBNP in the last 8760 hours. HbA1C: No results for input(s): HGBA1C in the last 72 hours. CBG: Recent Labs  Lab 08/13/19 1611 08/13/19 2141 08/14/19 0617 08/14/19 1234 08/14/19 1652  GLUCAP 298* 100* 88 290* 179*   Lipid Profile: No results for input(s): CHOL, HDL, LDLCALC, TRIG, CHOLHDL, LDLDIRECT in the last 72 hours. Thyroid Function Tests: No results for input(s): TSH, T4TOTAL, FREET4, T3FREE, THYROIDAB in the last 72 hours. Anemia Panel: No results for input(s): VITAMINB12, FOLATE, FERRITIN, TIBC, IRON, RETICCTPCT in the last 72 hours. Sepsis Labs: No results for input(s): PROCALCITON, LATICACIDVEN in the last 168 hours.  Recent Results (from the past 240 hour(s))  Urine culture     Status: Abnormal   Collection Time: 08/10/19  4:25 PM   Specimen: Urine, Random  Result Value Ref Range Status   Specimen Description URINE, RANDOM  Final   Special Requests   Final    NONE Performed at [facility], [address], [place], [place] [address]    Culture >=100,000 COLONIES/mL ENTEROBACTER HORMAECHEI (A)  Final   Report Status 08/13/2019 FINAL  Final   Organism ID, Bacteria ENTEROBACTER HORMAECHEI (A)  Final      Susceptibility   Enterobacter hormaechei - MIC*    CEFAZOLIN >=64 RESISTANT Resistant     CEFTRIAXONE <=1 SENSITIVE Sensitive     CIPROFLOXACIN <=0.25 SENSITIVE Sensitive     GENTAMICIN <=1 SENSITIVE Sensitive     IMIPENEM 0.5 SENSITIVE Sensitive     NITROFURANTOIN 32 SENSITIVE Sensitive     TRIMETH/SULFA <=20 SENSITIVE Sensitive     PIP/TAZO <=4 SENSITIVE Sensitive     * >=100,000 COLONIES/mL ENTEROBACTER HORMAECHEI  SARS CORONAVIRUS 2 (TAT 6-24 HRS)  Nasopharyngeal Nasopharyngeal Swab     Status: None   Collection Time: 08/10/19  6:24 PM   Specimen: Nasopharyngeal Swab  Result Value Ref Range Status   SARS Coronavirus 2 NEGATIVE NEGATIVE Final    Comment: (NOTE) SARS-CoV-2 target nucleic acids are NOT DETECTED. The SARS-CoV-2 RNA is generally detectable in upper and lower respiratory specimens during the acute phase of infection. Negative results do not preclude SARS-CoV-2 infection, do not rule out co-infections with other pathogens, and should not be used as the sole basis for treatment or other patient management decisions. Negative results must be combined with clinical observations, patient history, and  epidemiological information. The expected result is Negative. Fact Sheet for Patients: SugarRoll.be Fact Sheet for Healthcare Providers: https://www.woods-mathews.com/ This test is not yet approved or cleared by the [place] FDA and  has been authorized for detection and/or diagnosis of SARS-CoV-2 by FDA under an Emergency Use Authorization (EUA). This EUA will remain  in effect (meaning this test can be used) for the duration of the COVID-19 declaration under Section 56 4(b)(1) of the Act, 21 U.S.C. section 360bbb-3(b)(1), unless the authorization is terminated or revoked sooner. Performed at [facility], [place] [address], [place], [place] [address]          Radiology Studies: No results found.      Scheduled Meds: . amiodarone  200 mg Oral Daily  . anastrozole  1 mg Oral Daily  . atorvastatin  20 mg Oral QHS  . calcium carbonate  1,250 mg Oral Q breakfast  . carvedilol  12.5 mg Oral BID WC  . cefdinir  300 mg Oral Daily  . diclofenac sodium  2 g Topical QHS  . ferrous sulfate  325 mg Oral Q breakfast  . influenza vaccine adjuvanted  0.5 mL Intramuscular Tomorrow-1000  . insulin aspart  0-9 Units Subcutaneous TID WC  . insulin aspart  3 Units Subcutaneous  TID WC  . insulin detemir  16 Units Subcutaneous Daily  . insulin detemir  8 Units Subcutaneous QHS  . isosorbide-hydrALAZINE  1 tablet Oral TID  . magnesium oxide  400 mg Oral Daily  . oxybutynin  5 mg Oral BID  . pantoprazole  80 mg Oral Daily  . polyethylene glycol  17 g Oral Daily  . potassium chloride SA  40 mEq Oral Daily  . pregabalin  75 mg Oral QHS  . senna-docusate  2 tablet Oral BID  . sodium chloride flush  3 mL Intravenous Q12H  . torsemide  20 mg Oral QPM  . warfarin  7.5 mg Oral ONCE-1800  . Warfarin - Pharmacist Dosing Inpatient   Does not apply q1800   Continuous Infusions: . heparin 1,350 Units/hr (08/14/19 0909)     LOS: 3 days        Hosie Poisson, MD [facility] Pager [phone]  If 7PM-7AM, please contact night-coverage [email or web address] Password [email or web address] 08/14/2019, 5:05 PM

## 2019-08-14 NOTE — Progress Notes (Addendum)
ANTICOAGULATION CONSULT NOTE - Follow Up Consult  Pharmacy Consult for heparin / Warfarin Indication: DVT  Labs: Recent Labs    08/12/19 0557 08/13/19 0337 08/14/19 0329  HGB 9.6* 9.2* 8.7*  HCT 30.6* 28.5* 27.1*  PLT 112*  --  108*  APTT 58* 76* 118*  LABPROT  --  14.2 14.5  INR  --  1.1 1.1  HEPARINUNFRC 0.89* 0.97* 1.02*  CREATININE  --  1.96* 1.83*    Assessment: 85yo female continues on heparin / Warfarin, was on Eliquis prior to admission PTT elevated / heparin level not yet correlating   Warfarin started 10/19 INR not moving Day 3 of 5 of overlap   Goal of Therapy:  aPTT 66-102 seconds  INR = 2 to 3   Plan:  Decrease heparin to 1350  units / hr Warfarin 7.5 mg po x 1 dose tonight Daily heparin level, PTT, CBC, INR  Thank you Anette Guarneri, PharmD  08/14/2019,7:48 AM

## 2019-08-14 NOTE — Discharge Instructions (Signed)

## 2019-08-14 NOTE — Care Management Important Message (Signed)
Important Message  Patient Details  Name: [name] MRN: [id number] Date of Birth: 1933/04/19   Medicare Important Message Given:  Yes     [name] 08/14/2019, 11:35 AM

## 2019-08-15 ENCOUNTER — Inpatient Hospital Stay (HOSPITAL_COMMUNITY): Payer: Medicare Other

## 2019-08-15 ENCOUNTER — Ambulatory Visit: Payer: Self-pay

## 2019-08-15 DIAGNOSIS — I824Z1 Acute embolism and thrombosis of unspecified deep veins of right distal lower extremity: Secondary | ICD-10-CM | POA: Diagnosis not present

## 2019-08-15 DIAGNOSIS — I5032 Chronic diastolic (congestive) heart failure: Secondary | ICD-10-CM

## 2019-08-15 DIAGNOSIS — I10 Essential (primary) hypertension: Secondary | ICD-10-CM | POA: Diagnosis not present

## 2019-08-15 DIAGNOSIS — E1122 Type 2 diabetes mellitus with diabetic chronic kidney disease: Secondary | ICD-10-CM

## 2019-08-15 DIAGNOSIS — N183 Chronic kidney disease, stage 3 unspecified: Secondary | ICD-10-CM | POA: Diagnosis not present

## 2019-08-15 DIAGNOSIS — R5381 Other malaise: Secondary | ICD-10-CM

## 2019-08-15 LAB — CBC
HCT: 29.6 % — ABNORMAL LOW (ref 36.0–46.0)
Hemoglobin: 9.2 g/dL — ABNORMAL LOW (ref 12.0–15.0)
MCH: 29.7 pg (ref 26.0–34.0)
MCHC: 31.1 g/dL (ref 30.0–36.0)
MCV: 95.5 fL (ref 80.0–100.0)
Platelets: 111 10*3/uL — ABNORMAL LOW (ref 150–400)
RBC: 3.1 MIL/uL — ABNORMAL LOW (ref 3.87–5.11)
RDW: 15 % (ref 11.5–15.5)
WBC: 3.6 10*3/uL — ABNORMAL LOW (ref 4.0–10.5)
nRBC: 0 % (ref 0.0–0.2)

## 2019-08-15 LAB — CREATININE, SERUM
Creatinine, Ser: 1.78 mg/dL — ABNORMAL HIGH (ref 0.44–1.00)
GFR calc Af Amer: 30 mL/min — ABNORMAL LOW (ref >60)
GFR calc non Af Amer: 26 mL/min — ABNORMAL LOW (ref >60)

## 2019-08-15 LAB — GLUCOSE, CAPILLARY
Glucose-Capillary: 109 mg/dL — ABNORMAL HIGH (ref 70–99)
Glucose-Capillary: 195 mg/dL — ABNORMAL HIGH (ref 70–99)
Glucose-Capillary: 230 mg/dL — ABNORMAL HIGH (ref 70–99)
Glucose-Capillary: 172 mg/dL — ABNORMAL HIGH (ref 70–99)

## 2019-08-15 LAB — PROTIME-INR
INR: 1.1 (ref 0.8–1.2)
Prothrombin Time: 14.1 seconds (ref 11.4–15.2)

## 2019-08-15 LAB — HEPARIN LEVEL (UNFRACTIONATED): Heparin Unfractionated: 0.9 IU/mL — ABNORMAL HIGH (ref 0.30–0.70)

## 2019-08-15 LAB — APTT: aPTT: 94 seconds — ABNORMAL HIGH (ref 24–36)

## 2019-08-15 MED ORDER — IPRATROPIUM-ALBUTEROL 0.5-2.5 (3) MG/3ML IN SOLN
3.0000 mL | Freq: Four times a day (QID) | RESPIRATORY_TRACT | Status: DC
  Administered 2019-08-15 (×2): 3 mL via RESPIRATORY_TRACT
  Filled 2019-08-15 (×4): qty 3

## 2019-08-15 MED ORDER — FUROSEMIDE 10 MG/ML IJ SOLN
20.0000 mg | Freq: Once | INTRAMUSCULAR | Status: AC
  Administered 2019-08-15: 20 mg via INTRAVENOUS
  Filled 2019-08-15: qty 2

## 2019-08-15 MED ORDER — BISACODYL 10 MG RE SUPP: 10.0000 mg | Freq: Every day | RECTAL | Status: DC | PRN

## 2019-08-15 MED ORDER — WARFARIN SODIUM 10 MG PO TABS
10.0000 mg | ORAL_TABLET | Freq: Once | ORAL | Status: AC
  Administered 2019-08-15: 10 mg via ORAL
  Filled 2019-08-15: qty 1

## 2019-08-15 MED ORDER — GUAIFENESIN-DM 100-10 MG/5ML PO SYRP
5.0000 mL | ORAL_SOLUTION | ORAL | Status: DC | PRN
  Administered 2019-08-15 – 2019-08-20 (×3): 5 mL via ORAL
  Filled 2019-08-15 (×3): qty 5

## 2019-08-15 MED ORDER — ENOXAPARIN SODIUM 80 MG/0.8ML ~~LOC~~ SOLN
80.0000 mg | SUBCUTANEOUS | Status: DC
  Administered 2019-08-15 – 2019-08-18 (×4): 80 mg via SUBCUTANEOUS
  Filled 2019-08-15 (×4): qty 0.8

## 2019-08-15 MED ORDER — TRAMADOL HCL 50 MG PO TABS: 50.0000 mg | ORAL_TABLET | Freq: Four times a day (QID) | ORAL | Status: DC | PRN

## 2019-08-15 MED ORDER — BENZONATATE 100 MG PO CAPS: 200.0000 mg | ORAL_CAPSULE | Freq: Three times a day (TID) | ORAL | Status: DC | PRN

## 2019-08-15 NOTE — Progress Notes (Signed)
PROGRESS NOTE    [name]  CVE:[id number] DOB: 1932/10/27 DOA: 08/10/2019 PCP: [name], MD    Brief Narrative:  85 year old lady with prior h/o systolic heart failure, stage 3 CKD, s/p PPM,  Breast cancer s/p neo adjuvant hormone therapy, DVT, PE on eliquis, presents with syncope.   She is found to have recurrent DVT and syncope.  Interim history:  Currently waiting for her INR TO be therapeutic for discharge.  Changed IV heparin to lovenox injections.    Assessment & Plan:   Principal Problem:   DVT (deep venous thrombosis) (HCC) Active Problems:   CKD (chronic kidney disease), stage III (HCC)   HTN (hypertension)   DM (diabetes mellitus) ([place])   Malignant neoplasm of lower-inner quadrant of right breast of female, estrogen receptor positive ([place])   Syncope   Recurrent DVT:  ? Non compliance to  eliquis vs medication failure.  Restarted her on coumadin. Her INR is still sub therapeutic, her heparin changed to lovenox injections.     Syncope:  Sec to hypoglycemia vs orthostatic hypotension.  CT head negative.  PPM interrogated and no arrhythmias.  Decreased the dose of torsemide and TED hose ordered.  Repeat orthostatic vital signs in am.    H/o breast cancer:  Follows up with Dr Lindi Adie.    Type 2 DM: CBG (last 3)  Recent Labs    08/14/19 2110 08/15/19 0609 08/15/19 1111  GLUCAP 163* 109* 195*   No changes in SSI.    Hypertension;  Well controlled. No changes in meds.    Stage 3 CKD:  Creatinine at baseline.   Chronic systolic and diastolic heart failure.  She appears to be euvolemic.  Continue with demadex.    Hypokalemia:  Replaced.    OSA: bipap    H/o recurrent SVT/ LBBB: S/P PPM.    Enterobacter UTI:  Complete the course of cefnidir.   Constipation:  Senna, colace, miralax ordered.  Dulcolax prn will be ordered.    Cough :  CXR show pulm vascular congestion, .  Ordered one dose of IV lasix 20 mg once,  duonebs and tessalon drops      DVT prophylaxis: coumadin .  Code Status: full code.  Family Communication: none at bedside.  Disposition Plan: pending therapeutic INR.    Consultants:   NONE.    Procedures: NONE.    Antimicrobials: ON CEFNIDIR.    Subjective:  Persistent coughing and bringing up phlegm HAS NOT HAD A BM yet.  Will add dulcolax prn. .   Objective: Vitals:   08/14/19 1948 08/15/19 0454 08/15/19 0458 08/15/19 1433  BP: (!) 127/58 121/62  130/60  Pulse: 64 68  78  Resp: 18 20 18 18   Temp: 98.8 F (37.1 C) 99.3 F (37.4 C)  97.6 F (36.4 C)  TempSrc: Oral Oral  Oral  SpO2: 95% 95%  98%  Weight:   77.2 kg   Height:        Intake/Output Summary (Last 24 hours) at 08/15/2019 1537 Last data filed at 08/15/2019 1300 Gross per 24 hour  Intake 600 ml  Output 1100 ml  Net -500 ml   Filed Weights   08/13/19 0436 08/14/19 0457 08/15/19 0458  Weight: 80.8 kg 78.3 kg 77.2 kg    Examination:  General exam: not in distress.  Respiratory system: clear to auscultation, no wheezing or rhonchi.  Cardiovascular system: s1s2. RRR.  Gastrointestinal system: abd is soft , non tender non distended bowel sounds good.  Central nervous system: alert and non focal Extremities: no cyanosis or clubbing.  Skin:  No rashes.  Psychiatry: mood is appropriate.    Data Reviewed: I have personally reviewed following labs and imaging studies  CBC: Recent Labs  Lab 08/10/19 1441 08/11/19 0449 08/12/19 0557 08/13/19 0337 08/14/19 0329 08/15/19 0318  WBC 3.4* 4.6 3.5*  --  3.0* 3.6*  HGB 9.5* 11.3* 9.6* 9.2* 8.7* 9.2*  HCT 29.6* 35.1* 30.6* 28.5* 27.1* 29.6*  MCV 96.4 94.1 95.9  --  95.1 95.5  PLT 108* 133* 112*  --  108* 353*   Basic Metabolic Panel: Recent Labs  Lab 08/10/19 1441 08/11/19 0449 08/13/19 0337 08/14/19 0329 08/15/19 0953  NA 140 144 140 140  --   K 3.8 3.4* 3.9 3.9  --   CL 102 104 103 106  --   CO2 26 25 28 26   --   GLUCOSE 254* 92  140* 93  --   BUN 40* 31* 37* 36*  --   CREATININE 1.94* 1.63* 1.96* 1.83* 1.78*  CALCIUM 8.7* 8.8* 8.7* 8.7*  --    GFR: Estimated Creatinine Clearance: 24.2 mL/min (A) (by C-G formula based on SCr of 1.78 mg/dL (H)). Liver Function Tests: Recent Labs  Lab 08/10/19 1441  AST 38  ALT 41  ALKPHOS 97  BILITOT 0.5  PROT 6.9  ALBUMIN 3.0*   No results for input(s): LIPASE, AMYLASE in the last 168 hours. No results for input(s): AMMONIA in the last 168 hours. Coagulation Profile: Recent Labs  Lab 08/10/19 1441 08/13/19 0337 08/14/19 0329 08/15/19 0318  INR 1.2 1.1 1.1 1.1   Cardiac Enzymes: No results for input(s): CKTOTAL, CKMB, CKMBINDEX, TROPONINI in the last 168 hours. BNP (last 3 results) No results for input(s): PROBNP in the last 8760 hours. HbA1C: No results for input(s): HGBA1C in the last 72 hours. CBG: Recent Labs  Lab 08/14/19 1234 08/14/19 1652 08/14/19 2110 08/15/19 0609 08/15/19 1111  GLUCAP 290* 179* 163* 109* 195*   Lipid Profile: No results for input(s): CHOL, HDL, LDLCALC, TRIG, CHOLHDL, LDLDIRECT in the last 72 hours. Thyroid Function Tests: No results for input(s): TSH, T4TOTAL, FREET4, T3FREE, THYROIDAB in the last 72 hours. Anemia Panel: No results for input(s): VITAMINB12, FOLATE, FERRITIN, TIBC, IRON, RETICCTPCT in the last 72 hours. Sepsis Labs: No results for input(s): PROCALCITON, LATICACIDVEN in the last 168 hours.  Recent Results (from the past 240 hour(s))  Urine culture     Status: Abnormal   Collection Time: 08/10/19  4:25 PM   Specimen: Urine, Random  Result Value Ref Range Status   Specimen Description URINE, RANDOM  Final   Special Requests   Final    NONE Performed at [facility], [address], [place],  [address]    Culture >=100,000 COLONIES/mL ENTEROBACTER HORMAECHEI (A)  Final   Report Status 08/13/2019 FINAL  Final   Organism ID, Bacteria ENTEROBACTER HORMAECHEI (A)  Final      Susceptibility    Enterobacter hormaechei - MIC*    CEFAZOLIN >=64 RESISTANT Resistant     CEFTRIAXONE <=1 SENSITIVE Sensitive     CIPROFLOXACIN <=0.25 SENSITIVE Sensitive     GENTAMICIN <=1 SENSITIVE Sensitive     IMIPENEM 0.5 SENSITIVE Sensitive     NITROFURANTOIN 32 SENSITIVE Sensitive     TRIMETH/SULFA <=20 SENSITIVE Sensitive     PIP/TAZO <=4 SENSITIVE Sensitive     * >=100,000 COLONIES/mL ENTEROBACTER HORMAECHEI  SARS CORONAVIRUS 2 (TAT 6-24 HRS) Nasopharyngeal Nasopharyngeal  Swab     Status: None   Collection Time: 08/10/19  6:24 PM   Specimen: Nasopharyngeal Swab  Result Value Ref Range Status   SARS Coronavirus 2 NEGATIVE NEGATIVE Final    Comment: (NOTE) SARS-CoV-2 target nucleic acids are NOT DETECTED. The SARS-CoV-2 RNA is generally detectable in upper and lower respiratory specimens during the acute phase of infection. Negative results do not preclude SARS-CoV-2 infection, do not rule out co-infections with other pathogens, and should not be used as the sole basis for treatment or other patient management decisions. Negative results must be combined with clinical observations, patient history, and epidemiological information. The expected result is Negative. Fact Sheet for Patients: SugarRoll.be Fact Sheet for Healthcare Providers: https://www.woods-mathews.com/ This test is not yet approved or cleared by the [place] FDA and  has been authorized for detection and/or diagnosis of SARS-CoV-2 by FDA under an Emergency Use Authorization (EUA). This EUA will remain  in effect (meaning this test can be used) for the duration of the COVID-19 declaration under Section 56 4(b)(1) of the Act, 21 U.S.C. section 360bbb-3(b)(1), unless the authorization is terminated or revoked sooner. Performed at [facility], [place] [address], [place], [place] [address]          Radiology Studies: Dg Chest 2 View  Result Date: 08/15/2019 CLINICAL  DATA:  Weakness, falling, shortness of breath, diabetes mellitus, hypertension, CHF EXAM: CHEST - 2 VIEW COMPARISON:  08/10/2019 FINDINGS: LEFT subclavian sequential transvenous pacemaker leads project at RIGHT atrium and RIGHT ventricle. Enlargement of cardiac silhouette with pulmonary vascular congestion. Mediastinal contours normal. Slightly decreased lung volumes with bibasilar atelectasis. No acute infiltrate, pleural effusion, or pneumothorax. Bones demineralized with probable BILATERAL chronic rotator cuff tears. IMPRESSION: Enlargement of cardiac silhouette with pulmonary vascular congestion and pacemaker. Bibasilar atelectasis. Electronically Signed   By: Lavonia Dana M.D.   On: 08/15/2019 12:47        Scheduled Meds: . amiodarone  200 mg Oral Daily  . anastrozole  1 mg Oral Daily  . atorvastatin  20 mg Oral QHS  . calcium carbonate  1,250 mg Oral Q breakfast  . carvedilol  12.5 mg Oral BID WC  . cefdinir  300 mg Oral Daily  . diclofenac sodium  2 g Topical QHS  . enoxaparin (LOVENOX) injection  80 mg Subcutaneous Q24H  . ferrous sulfate  325 mg Oral Q breakfast  . influenza vaccine adjuvanted  0.5 mL Intramuscular Tomorrow-1000  . insulin aspart  0-9 Units Subcutaneous TID WC  . insulin aspart  3 Units Subcutaneous TID WC  . insulin detemir  16 Units Subcutaneous Daily  . insulin detemir  8 Units Subcutaneous QHS  . ipratropium-albuterol  3 mL Nebulization Q6H  . isosorbide-hydrALAZINE  1 tablet Oral TID  . magnesium oxide  400 mg Oral Daily  . oxybutynin  5 mg Oral BID  . pantoprazole  80 mg Oral Daily  . polyethylene glycol  17 g Oral Daily  . potassium chloride SA  40 mEq Oral Daily  . pregabalin  75 mg Oral QHS  . senna-docusate  2 tablet Oral BID  . sodium chloride flush  3 mL Intravenous Q12H  . torsemide  20 mg Oral QPM  . warfarin  10 mg Oral ONCE-1800  . Warfarin - Pharmacist Dosing Inpatient   Does not apply q1800   Continuous Infusions:    LOS: 4 days         Hosie Poisson, MD [facility] Pager [phone]  If 7PM-7AM, please contact night-coverage [email or web address] Password [facility] 08/15/2019, 3:37 PM

## 2019-08-15 NOTE — Progress Notes (Signed)
Physical Therapy Treatment Patient Details Name: [name] MRN: [id number] DOB: 1933/05/13 Today's Date: 08/15/2019    History of Present Illness 85 y.o. female with medical history significant of DVT and PE on eliquis, breast cancer undergoing neoadjuvant hormone therapy, chronic CHF with EF 40-45%, CKD stage 3, sinus pauses s/p PPM. She presented to the ED after a syncopal episode at home. R ankle fx diagnosed 07/09/19. Doppler revealed acute DVT R distal LE.    PT Comments    Patient seen for mobility progression. Pt is making progress toward PT goals and tolerated session well. Pt reports feeling congested and with new cough since 10/21 but that is really her only complaint at time of session. Pt tolerated gait distance of 120 ft total with seated break due to fatigue. Current plan remains appropriate.     Follow Up Recommendations  Home health PT;Supervision/Assistance - 24 hour     Equipment Recommendations  None recommended by PT    Recommendations for Other Services       Precautions / Restrictions Precautions Precautions: Fall Precaution Comments: L supraspinatus tear also noted from orthopedic note on 07/09/19 Required Braces or Orthoses: Other Brace Other Brace: air cast RLE for transfers/ambulation Restrictions Weight Bearing Restrictions: No RLE Weight Bearing: Weight bearing as tolerated Other Position/Activity Restrictions: per Dr. Lorin Mercy 07/09/19 WBAT in air cast RLE and limit ambulation    Mobility  Bed Mobility               General bed mobility comments: pt sitting EOB upon arrival  Transfers Overall transfer level: Needs assistance Equipment used: Rolling walker (2 wheeled) Transfers: Sit to/from Stand Sit to Stand: Min guard;Min assist         General transfer comment: cues for safe hand placement; assist for eccentric loading when returning to recliner; no physical assist to stand from EOB  Ambulation/Gait Ambulation/Gait assistance: +2  safety/equipment;Min guard(chair follow) Gait Distance (Feet): (120 ft total with seated break) Assistive device: Rolling walker (2 wheeled) Gait Pattern/deviations: Step-through pattern;Decreased step length - right;Decreased step length - left;Trunk flexed;Wide base of support;Decreased dorsiflexion - right;Decreased weight shift to right(valgus deformity at baseline)     General Gait Details: cues for safe use of AD and upright posture; seated break due to fatigue    Stairs             Wheelchair Mobility    Modified Rankin (Stroke Patients Only)       Balance Overall balance assessment: Needs assistance;History of Falls Sitting-balance support: No upper extremity supported;Feet supported Sitting balance-Leahy Scale: Fair     Standing balance support: Bilateral upper extremity supported;During functional activity Standing balance-Leahy Scale: Poor Standing balance comment: reliant on external support                            Cognition Arousal/Alertness: Awake/alert Behavior During Therapy: WFL for tasks assessed/performed Overall Cognitive Status: Within Functional Limits for tasks assessed                                        Exercises General Exercises - Lower Extremity Ankle Circles/Pumps: AROM;Both;Seated Long Arc Quad: AROM;Both;Seated Hip Flexion/Marching: AROM;Both;Seated;Standing    General Comments General comments (skin integrity, edema, etc.): pt reports feeling congested adn with a cough since 10/21 and pt reports that she notified RN  Pertinent Vitals/Pain Pain Assessment: No/denies pain    Home Living                      Prior Function            PT Goals (current goals can now be found in the care plan section) Acute Rehab PT Goals Patient Stated Goal: home Progress towards PT goals: Progressing toward goals    Frequency    Min 3X/week      PT Plan Current plan remains appropriate     Co-evaluation              AM-PAC PT "6 Clicks" Mobility   Outcome Measure  Help needed turning from your back to your side while in a flat bed without using bedrails?: A Little Help needed moving from lying on your back to sitting on the side of a flat bed without using bedrails?: A Little Help needed moving to and from a bed to a chair (including a wheelchair)?: A Little Help needed standing up from a chair using your arms (e.g., wheelchair or bedside chair)?: A Little Help needed to walk in hospital room?: A Little Help needed climbing 3-5 steps with a railing? : A Lot 6 Click Score: 17    End of Session Equipment Utilized During Treatment: Gait belt Activity Tolerance: Patient tolerated treatment well Patient left: in chair;with call bell/phone within reach Nurse Communication: Mobility status PT Visit Diagnosis: Other abnormalities of gait and mobility (R26.89);History of falling (Z91.81);Muscle weakness (generalized) (M62.81)     Time: 4734-0370 PT Time Calculation (min) (ACUTE ONLY): 23 min  Charges:  $Gait Training: 8-22 mins $Therapeutic Exercise: 8-22 mins                     Earney Navy, PTA Acute Rehabilitation Services Pager: [phone] Office: [phone]     Darliss Cheney 08/15/2019, 10:20 AM

## 2019-08-15 NOTE — Chronic Care Management (AMB) (Signed)
Chronic Care Management   Social Work Follow Up Note  08/15/2019 Name: [name] MRN: [id number] DOB: 01-16-1933  [name] is a 85 y.o. year old female who is a primary care patient of [name], MD. The CCM team was consulted for assistance with [employer] .   Review of patient status, including review of consultants reports, other relevant assessments, and collaboration with appropriate care team members and the patient's provider was performed as part of comprehensive patient evaluation and provision of chronic care management services.    SW received an inbound call from the patients daughter regarding follow up on housing options. See care plan below.  Facility-Administered Encounter Medications as of 08/15/2019  Medication  . acetaminophen (TYLENOL) tablet 650 mg   Or  . acetaminophen (TYLENOL) suppository 650 mg  . amiodarone (PACERONE) tablet 200 mg  . anastrozole (ARIMIDEX) tablet 1 mg  . atorvastatin (LIPITOR) tablet 20 mg  . benzonatate (TESSALON) capsule 200 mg  . calcium carbonate (OS-CAL - dosed in mg of elemental calcium) tablet 1,250 mg  . carvedilol (COREG) tablet 12.5 mg  . cefdinir (OMNICEF) capsule 300 mg  . diclofenac sodium (VOLTAREN) 1 % transdermal gel 2 g  . diclofenac sodium (VOLTAREN) 1 % transdermal gel 2 g  . enoxaparin (LOVENOX) injection 80 mg  . ferrous sulfate tablet 325 mg  . guaiFENesin-dextromethorphan (ROBITUSSIN DM) 100-10 MG/5ML syrup 5 mL  . influenza vaccine adjuvanted (FLUAD) injection 0.5 mL  . insulin aspart (novoLOG) injection 0-9 Units  . insulin aspart (novoLOG) injection 3 Units  . insulin detemir (LEVEMIR) injection 16 Units  . insulin detemir (LEVEMIR) injection 8 Units  . ipratropium-albuterol (DUONEB) 0.5-2.5 (3) MG/3ML nebulizer solution 3 mL  . isosorbide-hydrALAZINE (BIDIL) 20-37.5 MG per tablet 1 tablet  . magnesium oxide (MAG-OX) tablet 400 mg  . ondansetron (ZOFRAN) tablet 4 mg   Or  . ondansetron  (ZOFRAN) injection 4 mg  . oxybutynin (DITROPAN) tablet 5 mg  . pantoprazole (PROTONIX) EC tablet 80 mg  . polyethylene glycol (MIRALAX / GLYCOLAX) packet 17 g  . potassium chloride SA (KLOR-CON) CR tablet 40 mEq  . pregabalin (LYRICA) capsule 75 mg  . senna-docusate (Senokot-S) tablet 2 tablet  . sodium chloride flush (NS) 0.9 % injection 3 mL  . torsemide (DEMADEX) tablet 20 mg  . traMADol (ULTRAM) tablet 50 mg  . warfarin (COUMADIN) tablet 10 mg  . Warfarin - Pharmacist Dosing Inpatient   Outpatient Encounter Medications as of 08/15/2019  Medication Sig Note  . acetaminophen (TYLENOL) 500 MG tablet Take 1,000 mg by mouth every 6 (six) hours as needed for mild pain, moderate pain or headache.    [name] amiodarone (PACERONE) 200 MG tablet Take 1 tablet (200 mg total) by mouth daily.   [name] anastrozole (ARIMIDEX) 1 MG tablet Take 1 tablet (1 mg total) by mouth daily.   [name] apixaban (ELIQUIS) 2.5 MG TABS tablet Take 1 tablet (2.5 mg total) by mouth 2 (two) times daily. 08/10/2019: Regimen confirmed to be accurate by the patient  . atorvastatin (LIPITOR) 20 MG tablet Take 1 tablet (20 mg total) by mouth at bedtime.   . BD PEN NEEDLE NANO U/F 32G X 4 MM MISC Use as directed   . calcium carbonate (OS-CAL) 600 MG TABS tablet Take 600 mg by mouth daily with breakfast.    . carvedilol (COREG) 12.5 MG tablet Take 12.5 mg by mouth 2 (two) times daily with a meal. 08/10/2019: Regimen confirmed to be accurate by  the patient  . Cholecalciferol (VITAMIN D-3 PO) Take 1 capsule by mouth daily after breakfast.   . Cyanocobalamin (B-12 PO) Take 1 tablet by mouth daily after breakfast.   . diclofenac sodium (VOLTAREN) 1 % GEL Apply 2 g topically 4 (four) times daily. (Patient taking differently: Apply 2 g topically See admin instructions. Apply 2 grams to knees at bedtime and an additional 2 grams three times a day as needed for pain)   . ferrous sulfate 325 (65 FE) MG tablet Take 325 mg by mouth daily with breakfast.    . glucose blood (ACCU-CHEK GUIDE) test strip Use as instructed to check blood sugars 3 times per day prn dx: e11.22   . HUMALOG KWIKPEN 200 UNIT/ML SOPN Inject 4-8 Units into the skin 3 (three) times daily. Per sliding scale (Patient taking differently: Inject 4-8 Units into the skin See admin instructions. Inject 4-8 units into the skin three times a day before meals, per sliding scale) 08/10/2019: Regimen confirmed to be accurate by the patient  . isosorbide-hydrALAZINE (BIDIL) 20-37.5 MG tablet Take 1 tablet by mouth 3 (three) times daily.    [name] LEVEMIR FLEXTOUCH 100 UNIT/ML Pen Inject 10-16 Units into the skin daily. 16 units in the am and 10 units in the evening (Patient taking differently: Inject 8-16 Units into the skin See admin instructions. Inject 16 units into the skin in the morning and 8 units at bedtime) 08/10/2019: .Regimen confirmed to be accurate by the patient  . magnesium oxide (MAG-OX) 400 MG tablet Take 400 mg by mouth daily.   [name] omeprazole (PRILOSEC) 40 MG capsule TAKE ONE CAPSULE BY MOUTH DAILY BEFORE A MEAL (Patient taking differently: Take 40 mg by mouth 2 (two) times daily before a meal. )   . oxybutynin (DITROPAN) 5 MG tablet Take 1 tablet (5 mg total) by mouth 2 (two) times daily.   . potassium chloride SA (K-DUR) 20 MEQ tablet Take 2 tablets (40 mEq total) by mouth daily.   . pregabalin (LYRICA) 75 MG capsule Take 1 capsule (75 mg total) by mouth at bedtime.   . torsemide (DEMADEX) 20 MG tablet Take 2 tablets by mouth twice a day X's 3 days then take 2 tablets by mouth in the a.m. and 1 tablet by mouth in the p.m (Patient taking differently: Take 20-40 mg by mouth See admin instructions. Take 40 mg by mouth two times a day for 3 days, then take 40 mg in the morning and 20 mg in the evening for 4 days (then, repeat cycle))   . traMADol (ULTRAM) 50 MG tablet Take 50 mg by mouth 2 (two) times daily as needed for pain.      Goals Addressed            This Visit's Progress    . "We want to learn about long term care options for placement"       Grand-daughter and daughter stated:  Current Barriers:  . Financial constraints . Level of care concerns . Lacks knowledge of how to apply for long term care Medicaid  Clinical Social Work Clinical Goal(s):  [name] Over the next 45 days, client will follow up with Social Services as directed by SW . New 08/15/2019- Over the next 30 days the patient will follow up with her primary provider regarding necessary documents for a live in caregiver   Interventions: Completed 08/15/2019 with [name] . Inbound call from [name] to discuss patients desire for apartment living rather than SNF .  Determined that although patient continues to be inpatient she has an appointment scheduled for Wednesday October 28th regarding a new apartment . Advised by [name] that although the patients grand-daughter [name] plans to move in with the patient to assist with care, she is unable to attend the interview on the 28th. [name] requests the physician letter indicate caregiver to be either [name] or the patients son Lianne Moris . Advised Mrs Small the patients provider [name] is to write the desired letter . Communication with [name] requesting above caregivers be indicated in patient letter. Also advised patient would need to pick up letter by 08/20/2019   Patient Self Care Activities:  . Calls pharmacy for medication refills . Calls provider office for new concerns or questions`  Please see past updates related to this goal by clicking on the "Past Updates" button in the selected goal          Follow Up Plan: SW will follow up with patient by phone over the next month as previously planned.   [name], BSW, CDP Social Worker, Certified Dementia Practitioner [facility] [phone]  Total time spent performing care coordination and/or care management  activities with the patient by phone or face to face = 15 minutes.

## 2019-08-15 NOTE — TOC Initial Note (Addendum)
Transition of Care [facility]) - Initial/Assessment Note    Patient Details  Name: [name] MRN: [id number] Date of Birth: 15-Nov-1932  Transition of Care [facility]) CM/SW Contact:    Carles Collet, RN Phone Number: 08/15/2019, 3:10 PM  Clinical Narrative:        Patient from home w her daughter, per nursing report her daughter was looking in to having stay with her at home while she was at work. Spoke w patient at bedside. She states she has used [facility] and [facility] in the past, she would be agreeable to either. Referral made to [facility], waiting to hear back.           [facility] declined referral. Alvis Lemmings- pending   Expected Discharge Plan: [place] Barriers to Discharge: Continued Medical Work up   Patient Goals and CMS Choice        Expected Discharge Plan and Services Expected Discharge Plan: [place]                                              Prior Living Arrangements/Services                       Activities of Daily Living Home Assistive Devices/Equipment: Wheelchair, Environmental consultant (specify type) ADL Screening (condition at time of admission) Patient's cognitive ability adequate to safely complete daily activities?: Yes Is the patient deaf or have difficulty hearing?: No Does the patient have difficulty seeing, even when wearing glasses/contacts?: No Does the patient have difficulty concentrating, remembering, or making decisions?: No Patient able to express need for assistance with ADLs?: Yes Does the patient have difficulty dressing or bathing?: No Independently performs ADLs?: No Dressing (OT): Independent with device (comment) Toileting: Needs assistance Is this a change from baseline?: Pre-admission baseline In/Out Bed: Independent with device (comment) Walks in Home: Needs assistance Is this a change from baseline?: Pre-admission baseline Does the patient have difficulty walking or climbing stairs?: Yes Weakness of Legs:  Right Weakness of Arms/Hands: None  Permission Sought/Granted                  Emotional Assessment              Admission diagnosis:  Syncope and collapse [R55] Fall [W19.XXXA] Acute deep vein thrombosis (DVT) of other specified vein of right lower extremity ([place]) [I82.491] Patient Active Problem List   Diagnosis Date Noted  . Syncope 08/10/2019  . Displaced fracture of lateral malleolus of right fibula, initial encounter for closed fracture 07/10/2019  . Pain in left shoulder 07/10/2019  . Acute CHF (congestive heart failure) ([place]) 06/09/2019  . Acute pulmonary edema (HCC)   . Personal history of breast cancer 04/22/2019  . Pressure injury of skin 01/04/2019  . Acute pancreatitis 01/02/2019  . Malignant neoplasm of lower-inner quadrant of right breast of female, estrogen receptor positive ([place]) 11/23/2018  . Hematoma of right iliopsoas muscle 11/06/2018  . Intramuscular hematoma 11/06/2018  . Hypertensive heart disease with heart failure ([place]) 09/05/2017  . Aphasia 08/31/2017  . Dysphagia 08/31/2017  . CKD (chronic kidney disease), stage III ([place])   . LBBB (left bundle branch block)   . Anemia   . HTN (hypertension)   . Obesity   . DVT (deep venous thrombosis) ([place])   . DM (diabetes mellitus) ([place]) 07/28/2015  .  Fall 07/28/2015  . Warfarin-induced coagulopathy ([place]) 07/28/2015  . Acute on chronic diastolic CHF (congestive heart failure) ([place]) 03/19/2014  . Edema 08/06/2013  . SVT (supraventricular tachycardia) ([place]) 10/15/2012  . First degree AV block 10/15/2012  . Acute renal insufficiency 10/14/2012  . Dizziness and giddiness   . Thrombocytopenia ([place]) 11/21/2011   PCP:  [name], MD Pharmacy:   [facility] [phone] - [place], [place] - [address] AT [place] [address] [place] [address] Phone: [phone] Fax: [phone]     Social Determinants of Health ([place]) Interventions    Readmission  Risk Interventions Readmission Risk Prevention Plan 01/03/2019 01/02/2019  Transportation Screening (No Data) Complete  PCP or Specialist Appt within 3-5 Days - Complete  HRI or [place] - Complete  Social Work Consult for [place] Planning/Counseling - Complete  Palliative Care Screening - Not Applicable  Medication Review Press photographer) - Complete  Some recent data might be hidden

## 2019-08-15 NOTE — Progress Notes (Signed)
Placed patient on bipap with IPAP set at 10cm and EPAP set at 4cm. Patient unable to tolerate any higher settings. Will continue to monitor patient.

## 2019-08-15 NOTE — Patient Instructions (Signed)
Social Worker Visit Information  Goals we discussed today:  Goals Addressed            This Visit's Progress   . "We want to learn about long term care options for placement"       Grand-daughter and daughter stated:  Current Barriers:  . Financial constraints . Level of care concerns . Lacks knowledge of how to apply for long term care Medicaid  Clinical Social Work Clinical Goal(s):  [name] Over the next 45 days, client will follow up with Social Services as directed by SW . New 08/15/2019- Over the next 30 days the patient will follow up with her primary provider regarding necessary documents for a live in caregiver   Interventions: Completed 08/15/2019 with [name] . Inbound call from [name] to discuss patients desire for apartment living rather than SNF . Determined that although patient continues to be inpatient she has an appointment scheduled for Wednesday October 28th regarding a new apartment . Advised by [name] that although the patients grand-daughter [name] plans to move in with the patient to assist with care, she is unable to attend the interview on the 28th. [name] requests the physician letter indicate caregiver to be either [name] or the patients son Lianne Moris . Advised Mrs Small the patients provider [name] is to write the desired letter . Communication with [name] requesting above caregivers be indicated in patient letter. Also advised patient would need to pick up letter by 08/20/2019  Patient Self Care Activities:  . Calls pharmacy for medication refills . Calls provider office for new concerns or questions`  Please see past updates related to this goal by clicking on the "Past Updates" button in the selected goal         Follow Up Plan: SW will follow up with patient by phone over the next month as previously planned.   [name], BSW, CDP Social Worker, Certified Dementia  Practitioner [place] [facility] [place] Management [phone]

## 2019-08-15 NOTE — Progress Notes (Addendum)
ANTICOAGULATION CONSULT NOTE - Follow Up Consult  Pharmacy Consult for heparin to Lovenox  / Warfarin Indication: DVT  Labs: Recent Labs    08/13/19 0337 08/14/19 0329 08/15/19 0318  HGB 9.2* 8.7* 9.2*  HCT 28.5* 27.1* 29.6*  PLT  --  108* 111*  APTT 76* 118* 94*  LABPROT 14.2 14.5 14.1  INR 1.1 1.1 1.1  HEPARINUNFRC 0.97* 1.02* 0.90*  CREATININE 1.96* 1.83*  --     Assessment: 85yo female continues on heparin / Warfarin, was on Eliquis prior to admission (? Compliance, failure, Scr 1.83)  Switching heparin to Lovenox, CrCl< 30 ml / min  Warfarin started 10/19 INR not moving - > was on 10 mg / 7.5 mg doses when she took Coumadin before   Goal of Therapy:  Appropriate Lovenox dosing INR = 2 to 3   Plan:  DC heparin -- > Lovenox 80 mg sq Q 24 hours Warfarin 10 mg po x 1 dose tonight Daily INR and CBC  Thank you Anette Guarneri, PharmD  08/15/2019,8:05 AM

## 2019-08-16 ENCOUNTER — Encounter (HOSPITAL_COMMUNITY): Payer: Medicare Other

## 2019-08-16 DIAGNOSIS — N184 Chronic kidney disease, stage 4 (severe): Secondary | ICD-10-CM | POA: Diagnosis not present

## 2019-08-16 DIAGNOSIS — R05 Cough: Secondary | ICD-10-CM

## 2019-08-16 DIAGNOSIS — I824Z1 Acute embolism and thrombosis of unspecified deep veins of right distal lower extremity: Secondary | ICD-10-CM | POA: Diagnosis not present

## 2019-08-16 DIAGNOSIS — E1122 Type 2 diabetes mellitus with diabetic chronic kidney disease: Secondary | ICD-10-CM | POA: Diagnosis not present

## 2019-08-16 LAB — GLUCOSE, CAPILLARY
Glucose-Capillary: 65 mg/dL — ABNORMAL LOW (ref 70–99)
Glucose-Capillary: 116 mg/dL — ABNORMAL HIGH (ref 70–99)
Glucose-Capillary: 290 mg/dL — ABNORMAL HIGH (ref 70–99)
Glucose-Capillary: 209 mg/dL — ABNORMAL HIGH (ref 70–99)
Glucose-Capillary: 195 mg/dL — ABNORMAL HIGH (ref 70–99)

## 2019-08-16 LAB — BASIC METABOLIC PANEL
Anion gap: 10 (ref 5–15)
BUN: 32 mg/dL — ABNORMAL HIGH (ref 8–23)
CO2: 26 mmol/L (ref 22–32)
Calcium: 9 mg/dL (ref 8.9–10.3)
Chloride: 102 mmol/L (ref 98–111)
Creatinine, Ser: 1.78 mg/dL — ABNORMAL HIGH (ref 0.44–1.00)
GFR calc Af Amer: 30 mL/min — ABNORMAL LOW (ref >60)
GFR calc non Af Amer: 26 mL/min — ABNORMAL LOW (ref >60)
Glucose, Bld: 88 mg/dL (ref 70–99)
Potassium: 4.4 mmol/L (ref 3.5–5.1)
Sodium: 138 mmol/L (ref 135–145)

## 2019-08-16 LAB — CBC
HCT: 28.2 % — ABNORMAL LOW (ref 36.0–46.0)
Hemoglobin: 8.9 g/dL — ABNORMAL LOW (ref 12.0–15.0)
MCH: 29.9 pg (ref 26.0–34.0)
MCHC: 31.6 g/dL (ref 30.0–36.0)
MCV: 94.6 fL (ref 80.0–100.0)
Platelets: 114 10*3/uL — ABNORMAL LOW (ref 150–400)
RBC: 2.98 MIL/uL — ABNORMAL LOW (ref 3.87–5.11)
RDW: 15 % (ref 11.5–15.5)
WBC: 2.9 10*3/uL — ABNORMAL LOW (ref 4.0–10.5)
nRBC: 0 % (ref 0.0–0.2)

## 2019-08-16 LAB — PROTIME-INR
INR: 1.3 — ABNORMAL HIGH (ref 0.8–1.2)
Prothrombin Time: 16.2 seconds — ABNORMAL HIGH (ref 11.4–15.2)

## 2019-08-16 MED ORDER — FUROSEMIDE 10 MG/ML IJ SOLN
20.0000 mg | Freq: Once | INTRAMUSCULAR | Status: AC
  Administered 2019-08-16: 20 mg via INTRAVENOUS
  Filled 2019-08-16: qty 2

## 2019-08-16 MED ORDER — IPRATROPIUM-ALBUTEROL 0.5-2.5 (3) MG/3ML IN SOLN
3.0000 mL | Freq: Two times a day (BID) | RESPIRATORY_TRACT | Status: DC
  Administered 2019-08-17: 3 mL via RESPIRATORY_TRACT
  Filled 2019-08-16 (×3): qty 3

## 2019-08-16 MED ORDER — IPRATROPIUM-ALBUTEROL 0.5-2.5 (3) MG/3ML IN SOLN
3.0000 mL | Freq: Three times a day (TID) | RESPIRATORY_TRACT | Status: DC
  Administered 2019-08-16: 3 mL via RESPIRATORY_TRACT
  Filled 2019-08-16: qty 3

## 2019-08-16 MED ORDER — WARFARIN SODIUM 10 MG PO TABS
10.0000 mg | ORAL_TABLET | Freq: Once | ORAL | Status: AC
  Administered 2019-08-16: 10 mg via ORAL
  Filled 2019-08-16: qty 1

## 2019-08-16 MED ORDER — INSULIN DETEMIR 100 UNIT/ML ~~LOC~~ SOLN
10.0000 [IU] | Freq: Every day | SUBCUTANEOUS | Status: DC
  Administered 2019-08-17 – 2019-08-18 (×2): 10 [IU] via SUBCUTANEOUS
  Filled 2019-08-16 (×3): qty 0.1

## 2019-08-16 NOTE — TOC Progression Note (Signed)
Transition of Care [facility]) - Progression Note    Patient Details  Name: [name] MRN: [id number] Date of Birth: [birth year]  Transition of Care [facility]) CM/SW [place], [place] Phone Number: 08/16/2019, 4:58 PM  Clinical Narrative:     CSW called [place], RN with [facility]. She stated that the patient is involved with embedded care wit [facility]. She has an appointment with CSW Tillie Rung on 08/21/2019.   CSW called the patient's daughter to discuss her concerns. She stated that she works as a Recruitment consultant in [place] and is gone 10-12 hours a day and the patient would be home alone with her Granddaughter, who has a lot of health issues. Granddaughter and daughter are not able to provide the care and supervision that the patient needs. The patient's daughter spends two hours a day with her mother but is needing more care. The patient's daughter stated that she has not been able to find a home health aid to provide long term care hours.   Patient's daughter plans on talking with the patient about going to rehab.   CSW will continue to follow and assist with discharge planning.   Expected Discharge Plan: [place] Barriers to Discharge: Continued Medical Work up  Expected Discharge Plan and Services Expected Discharge Plan: [place]                                               Social Determinants of Health (SDOH) Interventions    Readmission Risk Interventions Readmission Risk Prevention Plan 01/03/2019 01/02/2019  Transportation Screening (No Data) Complete  PCP or Specialist Appt within 3-5 Days - Complete  HRI or [place] - Complete  Social Work Consult for [place] Planning/Counseling - Complete  Palliative Care Screening - Not Applicable  Medication Review Press photographer) - Complete  Some recent data might be hidden

## 2019-08-16 NOTE — Progress Notes (Signed)
ANTICOAGULATION CONSULT NOTE - Follow Up Consult  Pharmacy Consult for heparin to Lovenox  / Warfarin Indication: DVT  Labs: Recent Labs    08/14/19 0329 08/15/19 0318 08/15/19 0953 08/16/19 0338  HGB 8.7* 9.2*  --  8.9*  HCT 27.1* 29.6*  --  28.2*  PLT 108* 111*  --  114*  APTT 118* 94*  --   --   LABPROT 14.5 14.1  --  16.2*  INR 1.1 1.1  --  1.3*  HEPARINUNFRC 1.02* 0.90*  --   --   CREATININE 1.83*  --  1.78* 1.78*    Assessment: 85yo female continues on heparin / Warfarin, was on Eliquis prior to admission (? Compliance, failure, Scr 1.83)  Switching heparin to Lovenox, CrCl< 30 ml / min  Warfarin started 10/19 INR not moving - > was on 10 mg / 7.5 mg doses when she took Coumadin before   Goal of Therapy:  Appropriate Lovenox dosing INR = 2 to 3   Plan:  DC heparin -- > Lovenox 80 mg sq Q 24 hours Warfarin 10 mg po x 1 again tonight Daily INR and CBC  Marguerite Olea, [facility] Clinical Pharmacist Phone [phone]  08/16/2019 8:29 AM

## 2019-08-16 NOTE — Consult Note (Addendum)
   [facility] CM Inpatient Consult   08/16/2019  ERNEST ORR 03-Dec-1932 [id number]   Patient screened for extreme high risk score for unplanned readmission score of 33% hospitalizations to check if potential [place] Management services are needed.  Review of patient's medical record reveals patient is in the Embedded Practice of [facility] and has a Embedded Education officer, museum assisting the family on some possible long term care needs.  Primary Care Provider is Dr. [name], this practice provides the transition of care follow up and care management needs.  Plan: Will update Embedded CM staff of disposition needs changes.   Call received from inpatient [facility] LCSW regarding follow up with [place] social worker. Inpatient TOC team is working on Home health needs.  Please place a [facility] Care Management consult as appropriate and for questions contact:   Natividad Brood, RN BSN [facility] Liaison  [phone] business mobile phone Toll free office [phone]  Fax number: [phone] [email or web address]

## 2019-08-16 NOTE — Progress Notes (Signed)
PROGRESS NOTE    [name]  OYD:[id number] DOB: 09-30-1933 DOA: 08/10/2019 PCP: [name], MD    Brief Narrative:  85 year old lady with prior h/o systolic heart failure, stage 3 CKD, s/p PPM,  Breast cancer s/p neo adjuvant hormone therapy, DVT, PE on eliquis, presents with syncope.   She is found to have recurrent DVT and syncope.  Interim history:  INR improving its 1.3 this morning.  She has diuresed about 3 L in the last 24 hours.  Recommend another dose of IV Lasix today.   Assessment & Plan:   Principal Problem:   DVT (deep venous thrombosis) (HCC) Active Problems:   CKD (chronic kidney disease), stage III (HCC)   HTN (hypertension)   DM (diabetes mellitus) ([place])   Malignant neoplasm of lower-inner quadrant of right breast of female, estrogen receptor positive ([place])   Syncope   Recurrent DVT:  ? Non compliance to  eliquis vs medication failure.  Restarted her on coumadin. Her INR is still sub therapeutic, her heparin changed to lovenox injections.  Her INR has improved to 1.3 continue with Lovenox and Coumadin.    Syncope:  Sec to hypoglycemia vs orthostatic hypotension.  CT head negative.  PPM interrogated and no arrhythmias.  Decreased the dose of torsemide and TED hose ordered.  She denies any dizziness or syncope   H/o breast cancer:  Follows up with Dr Lindi Adie.    Type 2 DM: CBG (last 3)  Recent Labs    08/16/19 0624 08/16/19 0647 08/16/19 1117  GLUCAP 65* 116* 290*   Decrease the dose of Levemir from 16 units to 10 units daily and discontinued premeal insulin coverage.   Hypertension;  Well controlled. No changes in meds.    Stage 3 CKD:  Creatinine at baseline.   Chronic systolic and diastolic heart failure.  She appears to be euvolemic.  Continue with demadex.    Hypokalemia:  Replaced.    OSA: BiPAP at night   H/o recurrent SVT/ LBBB: S/P PPM.    Enterobacter UTI:  Complete the course of cefnidir.    Constipation:  Resolved.  She had a bowel movement yesterday.   Cough :  CXR show pulm vascular congestion, .  She has diuresed about 3 L after a dose of Lasix.  Continue with Lewayne Bunting.   Anemia of chronic disease Hemoglobin stable around 9.  No signs of bleeding.  DVT prophylaxis: coumadin .  Code Status: full code.  Family Communication: none at bedside.  Disposition Plan: pending therapeutic INR.    Consultants:   NONE.    Procedures: NONE.    Antimicrobials: ON CEFNIDIR.    Subjective:  Coughing has improved she is on room air in good oxygen saturations. Bowel movement yesterday.  Objective: Vitals:   08/16/19 0758 08/16/19 0850 08/16/19 0904 08/16/19 1115  BP:  (!) 114/52 (!) 114/52 (!) 141/62  Pulse:   72 62  Resp:  17  17  Temp:  98.5 F (36.9 C)  98.5 F (36.9 C)  TempSrc:  Oral  Oral  SpO2: 96% 100%    Weight:      Height:        Intake/Output Summary (Last 24 hours) at 08/16/2019 1628 Last data filed at 08/16/2019 1300 Gross per 24 hour  Intake 120 ml  Output 3150 ml  Net -3030 ml   Filed Weights   08/14/19 0457 08/15/19 0458 08/16/19 0626  Weight: 78.3 kg 77.2 kg 78 kg  Examination:  General exam: Alert and comfortable and not in any distress. Respiratory system: Air entry fair bilaterally, no wheezing or rhonchi Cardiovascular system: S1-S2 heard, regular rate rhythm Gastrointestinal system: Abdomen is soft, nontender, nondistended with good bowel sounds Central nervous system: Alert and oriented, nonfocal Extremities: No cyanosis or clubbing Skin: No rashes seen Psychiatry: Mood is appropriate   Data Reviewed: I have personally reviewed following labs and imaging studies  CBC: Recent Labs  Lab 08/11/19 0449 08/12/19 0557 08/13/19 0337 08/14/19 0329 08/15/19 0318 08/16/19 0338  WBC 4.6 3.5*  --  3.0* 3.6* 2.9*  HGB 11.3* 9.6* 9.2* 8.7* 9.2* 8.9*  HCT 35.1* 30.6* 28.5* 27.1* 29.6* 28.2*  MCV 94.1 95.9  --   95.1 95.5 94.6  PLT 133* 112*  --  108* 111* 382*   Basic Metabolic Panel: Recent Labs  Lab 08/10/19 1441 08/11/19 0449 08/13/19 0337 08/14/19 0329 08/15/19 0953 08/16/19 0338  NA 140 144 140 140  --  138  K 3.8 3.4* 3.9 3.9  --  4.4  CL 102 104 103 106  --  102  CO2 26 25 28 26   --  26  GLUCOSE 254* 92 140* 93  --  88  BUN 40* 31* 37* 36*  --  32*  CREATININE 1.94* 1.63* 1.96* 1.83* 1.78* 1.78*  CALCIUM 8.7* 8.8* 8.7* 8.7*  --  9.0   GFR: Estimated Creatinine Clearance: 24.3 mL/min (A) (by C-G formula based on SCr of 1.78 mg/dL (H)). Liver Function Tests: Recent Labs  Lab 08/10/19 1441  AST 38  ALT 41  ALKPHOS 97  BILITOT 0.5  PROT 6.9  ALBUMIN 3.0*   No results for input(s): LIPASE, AMYLASE in the last 168 hours. No results for input(s): AMMONIA in the last 168 hours. Coagulation Profile: Recent Labs  Lab 08/10/19 1441 08/13/19 0337 08/14/19 0329 08/15/19 0318 08/16/19 0338  INR 1.2 1.1 1.1 1.1 1.3*   Cardiac Enzymes: No results for input(s): CKTOTAL, CKMB, CKMBINDEX, TROPONINI in the last 168 hours. BNP (last 3 results) No results for input(s): PROBNP in the last 8760 hours. HbA1C: No results for input(s): HGBA1C in the last 72 hours. CBG: Recent Labs  Lab 08/15/19 1600 08/15/19 2101 08/16/19 0624 08/16/19 0647 08/16/19 1117  GLUCAP 230* 172* 65* 116* 290*   Lipid Profile: No results for input(s): CHOL, HDL, LDLCALC, TRIG, CHOLHDL, LDLDIRECT in the last 72 hours. Thyroid Function Tests: No results for input(s): TSH, T4TOTAL, FREET4, T3FREE, THYROIDAB in the last 72 hours. Anemia Panel: No results for input(s): VITAMINB12, FOLATE, FERRITIN, TIBC, IRON, RETICCTPCT in the last 72 hours. Sepsis Labs: No results for input(s): PROCALCITON, LATICACIDVEN in the last 168 hours.  Recent Results (from the past 240 hour(s))  Urine culture     Status: Abnormal   Collection Time: 08/10/19  4:25 PM   Specimen: Urine, Random  Result Value Ref Range Status    Specimen Description URINE, RANDOM  Final   Special Requests   Final    NONE Performed at [facility], [address], [place], [place] [address]    Culture >=100,000 COLONIES/mL ENTEROBACTER HORMAECHEI (A)  Final   Report Status 08/13/2019 FINAL  Final   Organism ID, Bacteria ENTEROBACTER HORMAECHEI (A)  Final      Susceptibility   Enterobacter hormaechei - MIC*    CEFAZOLIN >=64 RESISTANT Resistant     CEFTRIAXONE <=1 SENSITIVE Sensitive     CIPROFLOXACIN <=0.25 SENSITIVE Sensitive     GENTAMICIN <=1 SENSITIVE Sensitive  IMIPENEM 0.5 SENSITIVE Sensitive     NITROFURANTOIN 32 SENSITIVE Sensitive     TRIMETH/SULFA <=20 SENSITIVE Sensitive     PIP/TAZO <=4 SENSITIVE Sensitive     * >=100,000 COLONIES/mL ENTEROBACTER HORMAECHEI  SARS CORONAVIRUS 2 (TAT 6-24 HRS) Nasopharyngeal Nasopharyngeal Swab     Status: None   Collection Time: 08/10/19  6:24 PM   Specimen: Nasopharyngeal Swab  Result Value Ref Range Status   SARS Coronavirus 2 NEGATIVE NEGATIVE Final    Comment: (NOTE) SARS-CoV-2 target nucleic acids are NOT DETECTED. The SARS-CoV-2 RNA is generally detectable in upper and lower respiratory specimens during the acute phase of infection. Negative results do not preclude SARS-CoV-2 infection, do not rule out co-infections with other pathogens, and should not be used as the sole basis for treatment or other patient management decisions. Negative results must be combined with clinical observations, patient history, and epidemiological information. The expected result is Negative. Fact Sheet for Patients: SugarRoll.be Fact Sheet for Healthcare Providers: https://www.woods-mathews.com/ This test is not yet approved or cleared by the [place] FDA and  has been authorized for detection and/or diagnosis of SARS-CoV-2 by FDA under an Emergency Use Authorization (EUA). This EUA will remain  in effect (meaning this test can be  used) for the duration of the COVID-19 declaration under Section 56 4(b)(1) of the Act, 21 U.S.C. section 360bbb-3(b)(1), unless the authorization is terminated or revoked sooner. Performed at [facility], [place] [address] [place], [place] [address]          Radiology Studies: Dg Chest 2 View  Result Date: 08/15/2019 CLINICAL DATA:  Weakness, falling, shortness of breath, diabetes mellitus, hypertension, CHF EXAM: CHEST - 2 VIEW COMPARISON:  08/10/2019 FINDINGS: LEFT subclavian sequential transvenous pacemaker leads project at RIGHT atrium and RIGHT ventricle. Enlargement of cardiac silhouette with pulmonary vascular congestion. Mediastinal contours normal. Slightly decreased lung volumes with bibasilar atelectasis. No acute infiltrate, pleural effusion, or pneumothorax. Bones demineralized with probable BILATERAL chronic rotator cuff tears. IMPRESSION: Enlargement of cardiac silhouette with pulmonary vascular congestion and pacemaker. Bibasilar atelectasis. Electronically Signed   By: Lavonia Dana M.D.   On: 08/15/2019 12:47        Scheduled Meds: . amiodarone  200 mg Oral Daily  . anastrozole  1 mg Oral Daily  . atorvastatin  20 mg Oral QHS  . calcium carbonate  1,250 mg Oral Q breakfast  . carvedilol  12.5 mg Oral BID WC  . cefdinir  300 mg Oral Daily  . diclofenac sodium  2 g Topical QHS  . enoxaparin (LOVENOX) injection  80 mg Subcutaneous Q24H  . ferrous sulfate  325 mg Oral Q breakfast  . influenza vaccine adjuvanted  0.5 mL Intramuscular Tomorrow-1000  . insulin aspart  0-9 Units Subcutaneous TID WC  . [START ON 08/17/2019] insulin detemir  10 Units Subcutaneous Daily  . ipratropium-albuterol  3 mL Nebulization BID  . isosorbide-hydrALAZINE  1 tablet Oral TID  . magnesium oxide  400 mg Oral Daily  . oxybutynin  5 mg Oral BID  . pantoprazole  80 mg Oral Daily  . polyethylene glycol  17 g Oral Daily  . potassium chloride SA  40 mEq Oral Daily  . pregabalin  75  mg Oral QHS  . senna-docusate  2 tablet Oral BID  . sodium chloride flush  3 mL Intravenous Q12H  . torsemide  20 mg Oral QPM  . warfarin  10 mg Oral ONCE-1800  . Warfarin - Pharmacist Dosing Inpatient  Does not apply q1800   Continuous Infusions:    LOS: 5 days        Hosie Poisson, MD [facility] Pager [phone]  If 7PM-7AM, please contact night-coverage [email or web address] Password [facility] 08/16/2019, 4:28 PM

## 2019-08-16 NOTE — Progress Notes (Signed)
Hypoglycemic Event  CBG: 65  Treatment: 240 mLs apple juice, graham crackers  Symptoms: none  Follow-up CBG: [id number] CBG Result:116  Possible Reasons for Event: low intake?  Comments/MD notified: [place]

## 2019-08-17 DIAGNOSIS — E1122 Type 2 diabetes mellitus with diabetic chronic kidney disease: Secondary | ICD-10-CM | POA: Diagnosis not present

## 2019-08-17 DIAGNOSIS — R05 Cough: Secondary | ICD-10-CM | POA: Diagnosis not present

## 2019-08-17 DIAGNOSIS — N184 Chronic kidney disease, stage 4 (severe): Secondary | ICD-10-CM | POA: Diagnosis not present

## 2019-08-17 DIAGNOSIS — I824Z1 Acute embolism and thrombosis of unspecified deep veins of right distal lower extremity: Secondary | ICD-10-CM | POA: Diagnosis not present

## 2019-08-17 LAB — CBC
HCT: 30 % — ABNORMAL LOW (ref 36.0–46.0)
Hemoglobin: 9.5 g/dL — ABNORMAL LOW (ref 12.0–15.0)
MCH: 30.4 pg (ref 26.0–34.0)
MCHC: 31.7 g/dL (ref 30.0–36.0)
MCV: 95.8 fL (ref 80.0–100.0)
Platelets: 127 10*3/uL — ABNORMAL LOW (ref 150–400)
RBC: 3.13 MIL/uL — ABNORMAL LOW (ref 3.87–5.11)
RDW: 15.2 % (ref 11.5–15.5)
WBC: 3.1 10*3/uL — ABNORMAL LOW (ref 4.0–10.5)
nRBC: 0.7 % — ABNORMAL HIGH (ref 0.0–0.2)

## 2019-08-17 LAB — PROTIME-INR
INR: 1.5 — ABNORMAL HIGH (ref 0.8–1.2)
Prothrombin Time: 18.1 seconds — ABNORMAL HIGH (ref 11.4–15.2)

## 2019-08-17 LAB — GLUCOSE, CAPILLARY
Glucose-Capillary: 147 mg/dL — ABNORMAL HIGH (ref 70–99)
Glucose-Capillary: 184 mg/dL — ABNORMAL HIGH (ref 70–99)
Glucose-Capillary: 263 mg/dL — ABNORMAL HIGH (ref 70–99)
Glucose-Capillary: 244 mg/dL — ABNORMAL HIGH (ref 70–99)

## 2019-08-17 MED ORDER — WARFARIN SODIUM 2.5 MG PO TABS
12.5000 mg | ORAL_TABLET | Freq: Once | ORAL | Status: AC
  Administered 2019-08-17: 12.5 mg via ORAL
  Filled 2019-08-17: qty 1

## 2019-08-17 NOTE — Progress Notes (Signed)
PROGRESS NOTE    [name]  EPP:[id number] DOB: Jun 17, 1933 DOA: 08/10/2019 PCP: [name], MD    Brief Narrative:  85 year old lady with prior h/o systolic heart failure, stage 3 CKD, s/p PPM,  Breast cancer s/p neo adjuvant hormone therapy, DVT, PE on eliquis, presents with syncope.   She is found to have recurrent DVT and syncope.  She is started on heparin and coumadin , later on transitioned to lovenox and coumadin. . Plan to discharge the patient home with home care services but after speaking with the daughter over the phone family and the patient would like to pursue SNF placement.  Social worker is aware.   Assessment & Plan:   Principal Problem:   DVT (deep venous thrombosis) (HCC) Active Problems:   CKD (chronic kidney disease), stage III (HCC)   HTN (hypertension)   DM (diabetes mellitus) ([place])   Malignant neoplasm of lower-inner quadrant of right breast of female, estrogen receptor positive ([place])   Syncope   Recurrent DVT:  ? Non compliance to  eliquis vs medication failure.  Restarted her on coumadin. Her INR is still sub therapeutic, her heparin changed to lovenox injections.  Her INR has improved to 1.5 continue with Lovenox and Coumadin.    Syncope:  Sec to hypoglycemia vs orthostatic hypotension.  CT head negative.  PPM interrogated and no arrhythmias.  Decreased the dose of torsemide and TED hose ordered.  She denies any dizziness or syncope. No new complaints at this time.   H/o breast cancer:  Follows up with Dr Lindi Adie as outpatient.   Type 2 DM: CBG (last 3)  Recent Labs    08/17/19 0613 08/17/19 1142 08/17/19 1629  GLUCAP 147* 184* 263*   Continue with Levemir 10 units daily and sliding scale insulin.   Hypertension;  Well-controlled. No changes in meds.    Stage 3 CKD:  Creatinine at baseline at 1.7  Chronic systolic and diastolic heart failure.  She appears to be euvolemic.  Continue with demadex.    Hypokalemia:   Replaced.    OSA: BiPAP at night   H/o recurrent SVT/ LBBB: S/P PPM.    Enterobacter UTI:  Completed the course of cefnidir.   Constipation:  Resolved   Cough :  CXR show pulm vascular congestion, .  Diuresed appropriately with Demadex and Lasix.   Anemia of chronic disease Hemoglobin stable around 9.  No signs of bleeding.  DVT prophylaxis: coumadin .  Code Status: full code.  Family Communication: none at bedside.  Disposition Plan: pending therapeutic INR.    Consultants:   NONE.    Procedures: NONE.    Antimicrobials: ON CEFNIDIR.    Subjective: Patient denies any complaints at this time currently waiting for the INR to be therapeutic.  Patient agreeable to SNF placement.  Objective: Vitals:   08/17/19 0941 08/17/19 1136 08/17/19 1658 08/17/19 1700  BP: 131/62 103/60 123/60   Pulse: 65 65 (!) 47 66  Resp: (!) 29 20 (!) 22   Temp: 99.1 F (37.3 C) 98.3 F (36.8 C)    TempSrc: Oral Oral    SpO2: 99% 99% 96%   Weight:      Height:        Intake/Output Summary (Last 24 hours) at 08/17/2019 1708 Last data filed at 08/17/2019 1533 Gross per 24 hour  Intake 360 ml  Output 2400 ml  Net -2040 ml   Filed Weights   08/15/19 0458 08/16/19 0626 08/17/19 0330  Weight:  77.2 kg 78 kg 77.9 kg    Examination:  General exam: Alert and comfortable Respiratory system: Diminished air entry at bases no wheezing or rhonchi Cardiovascular system: S1-S2 heard, regular rate rhythm Gastrointestinal system: Abdomen is soft, nontender, nondistended, bowel sounds are good Central nervous system: Alert and oriented, nonfocal Extremities: No cyanosis or clubbing Skin: no rashes seen Psychiatry: Mood is appropriate   Data Reviewed: I have personally reviewed following labs and imaging studies  CBC: Recent Labs  Lab 08/12/19 0557 08/13/19 0337 08/14/19 0329 08/15/19 0318 08/16/19 0338 08/17/19 0335  WBC 3.5*  --  3.0* 3.6* 2.9* 3.1*  HGB 9.6* 9.2*  8.7* 9.2* 8.9* 9.5*  HCT 30.6* 28.5* 27.1* 29.6* 28.2* 30.0*  MCV 95.9  --  95.1 95.5 94.6 95.8  PLT 112*  --  108* 111* 114* 811*   Basic Metabolic Panel: Recent Labs  Lab 08/11/19 0449 08/13/19 0337 08/14/19 0329 08/15/19 0953 08/16/19 0338  NA 144 140 140  --  138  K 3.4* 3.9 3.9  --  4.4  CL 104 103 106  --  102  CO2 25 28 26   --  26  GLUCOSE 92 140* 93  --  88  BUN 31* 37* 36*  --  32*  CREATININE 1.63* 1.96* 1.83* 1.78* 1.78*  CALCIUM 8.8* 8.7* 8.7*  --  9.0   GFR: Estimated Creatinine Clearance: 24.3 mL/min (A) (by C-G formula based on SCr of 1.78 mg/dL (H)). Liver Function Tests: No results for input(s): AST, ALT, ALKPHOS, BILITOT, PROT, ALBUMIN in the last 168 hours. No results for input(s): LIPASE, AMYLASE in the last 168 hours. No results for input(s): AMMONIA in the last 168 hours. Coagulation Profile: Recent Labs  Lab 08/13/19 0337 08/14/19 0329 08/15/19 0318 08/16/19 0338 08/17/19 0335  INR 1.1 1.1 1.1 1.3* 1.5*   Cardiac Enzymes: No results for input(s): CKTOTAL, CKMB, CKMBINDEX, TROPONINI in the last 168 hours. BNP (last 3 results) No results for input(s): PROBNP in the last 8760 hours. HbA1C: No results for input(s): HGBA1C in the last 72 hours. CBG: Recent Labs  Lab 08/16/19 1719 08/16/19 2141 08/17/19 0613 08/17/19 1142 08/17/19 1629  GLUCAP 209* 195* 147* 184* 263*   Lipid Profile: No results for input(s): CHOL, HDL, LDLCALC, TRIG, CHOLHDL, LDLDIRECT in the last 72 hours. Thyroid Function Tests: No results for input(s): TSH, T4TOTAL, FREET4, T3FREE, THYROIDAB in the last 72 hours. Anemia Panel: No results for input(s): VITAMINB12, FOLATE, FERRITIN, TIBC, IRON, RETICCTPCT in the last 72 hours. Sepsis Labs: No results for input(s): PROCALCITON, LATICACIDVEN in the last 168 hours.  Recent Results (from the past 240 hour(s))  Urine culture     Status: Abnormal   Collection Time: 08/10/19  4:25 PM   Specimen: Urine, Random  Result  Value Ref Range Status   Specimen Description URINE, RANDOM  Final   Special Requests   Final    NONE Performed at [facility], [address] [place], [place] [address]    Culture >=100,000 COLONIES/mL ENTEROBACTER HORMAECHEI (A)  Final   Report Status 08/13/2019 FINAL  Final   Organism ID, Bacteria ENTEROBACTER HORMAECHEI (A)  Final      Susceptibility   Enterobacter hormaechei - MIC*    CEFAZOLIN >=64 RESISTANT Resistant     CEFTRIAXONE <=1 SENSITIVE Sensitive     CIPROFLOXACIN <=0.25 SENSITIVE Sensitive     GENTAMICIN <=1 SENSITIVE Sensitive     IMIPENEM 0.5 SENSITIVE Sensitive     NITROFURANTOIN 32 SENSITIVE Sensitive  TRIMETH/SULFA <=20 SENSITIVE Sensitive     PIP/TAZO <=4 SENSITIVE Sensitive     * >=100,000 COLONIES/mL ENTEROBACTER HORMAECHEI  SARS CORONAVIRUS 2 (TAT 6-24 HRS) Nasopharyngeal Nasopharyngeal Swab     Status: None   Collection Time: 08/10/19  6:24 PM   Specimen: Nasopharyngeal Swab  Result Value Ref Range Status   SARS Coronavirus 2 NEGATIVE NEGATIVE Final    Comment: (NOTE) SARS-CoV-2 target nucleic acids are NOT DETECTED. The SARS-CoV-2 RNA is generally detectable in upper and lower respiratory specimens during the acute phase of infection. Negative results do not preclude SARS-CoV-2 infection, do not rule out co-infections with other pathogens, and should not be used as the sole basis for treatment or other patient management decisions. Negative results must be combined with clinical observations, patient history, and epidemiological information. The expected result is Negative. Fact Sheet for Patients: SugarRoll.be Fact Sheet for Healthcare Providers: https://www.woods-mathews.com/ This test is not yet approved or cleared by the [place] FDA and  has been authorized for detection and/or diagnosis of SARS-CoV-2 by FDA under an Emergency Use Authorization (EUA). This EUA will remain  in effect  (meaning this test can be used) for the duration of the COVID-19 declaration under Section 56 4(b)(1) of the Act, 21 U.S.C. section 360bbb-3(b)(1), unless the authorization is terminated or revoked sooner. Performed at [facility], [address], [place], [place] [address]          Radiology Studies: No results found.      Scheduled Meds: . amiodarone  200 mg Oral Daily  . anastrozole  1 mg Oral Daily  . atorvastatin  20 mg Oral QHS  . calcium carbonate  1,250 mg Oral Q breakfast  . carvedilol  12.5 mg Oral BID WC  . diclofenac sodium  2 g Topical QHS  . enoxaparin (LOVENOX) injection  80 mg Subcutaneous Q24H  . ferrous sulfate  325 mg Oral Q breakfast  . influenza vaccine adjuvanted  0.5 mL Intramuscular Tomorrow-1000  . insulin aspart  0-9 Units Subcutaneous TID WC  . insulin detemir  10 Units Subcutaneous Daily  . ipratropium-albuterol  3 mL Nebulization BID  . isosorbide-hydrALAZINE  1 tablet Oral TID  . magnesium oxide  400 mg Oral Daily  . oxybutynin  5 mg Oral BID  . pantoprazole  80 mg Oral Daily  . polyethylene glycol  17 g Oral Daily  . potassium chloride SA  40 mEq Oral Daily  . pregabalin  75 mg Oral QHS  . senna-docusate  2 tablet Oral BID  . sodium chloride flush  3 mL Intravenous Q12H  . torsemide  20 mg Oral QPM  . warfarin  12.5 mg Oral ONCE-1800  . Warfarin - Pharmacist Dosing Inpatient   Does not apply q1800   Continuous Infusions:    LOS: 6 days        Hosie Poisson, MD [facility] Pager [phone]  If 7PM-7AM, please contact night-coverage [email or web address] Password [email or web address] 08/17/2019, 5:08 PM

## 2019-08-17 NOTE — Progress Notes (Signed)
Patient refused BIPAP tonight.

## 2019-08-17 NOTE — Progress Notes (Signed)
ANTICOAGULATION CONSULT NOTE - Follow Up Consult  Pharmacy Consult for heparin to Lovenox  / Warfarin Indication: DVT  Labs: Recent Labs    08/15/19 0318 08/15/19 0953 08/16/19 0338 08/17/19 0335  HGB 9.2*  --  8.9* 9.5*  HCT 29.6*  --  28.2* 30.0*  PLT 111*  --  114* 127*  APTT 94*  --   --   --   LABPROT 14.1  --  16.2* 18.1*  INR 1.1  --  1.3* 1.5*  HEPARINUNFRC 0.90*  --   --   --   CREATININE  --  1.78* 1.78*  --     Assessment: 85yo female continues on heparin / Warfarin, was on Eliquis prior to admission (? Compliance, failure)  Switching heparin to Lovenox, CrCl< 30 ml / min  Warfarin started 10/19; INR slow to increase - > was on 10 mg / 7.5 mg doses when she took Coumadin before.  No overt bleeding or complications noted.   Goal of Therapy:  Appropriate Lovenox dosing INR = 2 to 3   Plan:  Continue Lovenox 80 mg sq Q 24 hours Warfarin 12.5 mg po x 1 again tonight Will need therapeutic INR over 2 x 24 hrs before stopping Lovenox. Daily INR and CBC  [place] Crane, Roylene Reason, [facility] Clinical Pharmacist Phone [phone]  08/17/2019 8:59 AM

## 2019-08-18 DIAGNOSIS — R05 Cough: Secondary | ICD-10-CM | POA: Diagnosis not present

## 2019-08-18 DIAGNOSIS — N184 Chronic kidney disease, stage 4 (severe): Secondary | ICD-10-CM | POA: Diagnosis not present

## 2019-08-18 DIAGNOSIS — I824Z1 Acute embolism and thrombosis of unspecified deep veins of right distal lower extremity: Secondary | ICD-10-CM | POA: Diagnosis not present

## 2019-08-18 DIAGNOSIS — E1122 Type 2 diabetes mellitus with diabetic chronic kidney disease: Secondary | ICD-10-CM | POA: Diagnosis not present

## 2019-08-18 LAB — PROTIME-INR
INR: 1.8 — ABNORMAL HIGH (ref 0.8–1.2)
Prothrombin Time: 20.8 seconds — ABNORMAL HIGH (ref 11.4–15.2)

## 2019-08-18 LAB — BASIC METABOLIC PANEL
Anion gap: 10 (ref 5–15)
BUN: 38 mg/dL — ABNORMAL HIGH (ref 8–23)
CO2: 25 mmol/L (ref 22–32)
Calcium: 8.7 mg/dL — ABNORMAL LOW (ref 8.9–10.3)
Chloride: 102 mmol/L (ref 98–111)
Creatinine, Ser: 2.19 mg/dL — ABNORMAL HIGH (ref 0.44–1.00)
GFR calc Af Amer: 23 mL/min — ABNORMAL LOW (ref >60)
GFR calc non Af Amer: 20 mL/min — ABNORMAL LOW (ref >60)
Glucose, Bld: 187 mg/dL — ABNORMAL HIGH (ref 70–99)
Potassium: 5.2 mmol/L — ABNORMAL HIGH (ref 3.5–5.1)
Sodium: 137 mmol/L (ref 135–145)

## 2019-08-18 LAB — CBC
HCT: 29 % — ABNORMAL LOW (ref 36.0–46.0)
Hemoglobin: 9.3 g/dL — ABNORMAL LOW (ref 12.0–15.0)
MCH: 30.4 pg (ref 26.0–34.0)
MCHC: 32.1 g/dL (ref 30.0–36.0)
MCV: 94.8 fL (ref 80.0–100.0)
Platelets: 124 10*3/uL — ABNORMAL LOW (ref 150–400)
RBC: 3.06 MIL/uL — ABNORMAL LOW (ref 3.87–5.11)
RDW: 15.3 % (ref 11.5–15.5)
WBC: 3.5 10*3/uL — ABNORMAL LOW (ref 4.0–10.5)
nRBC: 0 % (ref 0.0–0.2)

## 2019-08-18 LAB — GLUCOSE, CAPILLARY
Glucose-Capillary: 171 mg/dL — ABNORMAL HIGH (ref 70–99)
Glucose-Capillary: 245 mg/dL — ABNORMAL HIGH (ref 70–99)
Glucose-Capillary: 303 mg/dL — ABNORMAL HIGH (ref 70–99)
Glucose-Capillary: 151 mg/dL — ABNORMAL HIGH (ref 70–99)

## 2019-08-18 MED ORDER — INSULIN DETEMIR 100 UNIT/ML ~~LOC~~ SOLN
14.0000 [IU] | Freq: Every day | SUBCUTANEOUS | Status: DC
  Administered 2019-08-19 – 2019-08-20 (×2): 14 [IU] via SUBCUTANEOUS
  Filled 2019-08-18 (×2): qty 0.14

## 2019-08-18 MED ORDER — WARFARIN SODIUM 2.5 MG PO TABS
12.5000 mg | ORAL_TABLET | Freq: Once | ORAL | Status: AC
  Administered 2019-08-18: 12.5 mg via ORAL
  Filled 2019-08-18: qty 1

## 2019-08-18 MED ORDER — INSULIN DETEMIR 100 UNIT/ML ~~LOC~~ SOLN
8.0000 [IU] | Freq: Every day | SUBCUTANEOUS | Status: DC
  Administered 2019-08-18: 8 [IU] via SUBCUTANEOUS
  Filled 2019-08-18 (×3): qty 0.08

## 2019-08-18 MED ORDER — IPRATROPIUM-ALBUTEROL 0.5-2.5 (3) MG/3ML IN SOLN: 3.0000 mL | Freq: Four times a day (QID) | RESPIRATORY_TRACT | Status: DC | PRN

## 2019-08-18 MED ORDER — SENNOSIDES-DOCUSATE SODIUM 8.6-50 MG PO TABS: 2.0000 | ORAL_TABLET | Freq: Every evening | ORAL | Status: DC | PRN

## 2019-08-18 MED ORDER — SODIUM POLYSTYRENE SULFONATE 15 GM/60ML PO SUSP
30.0000 g | Freq: Once | ORAL | Status: AC
  Administered 2019-08-18: 13:00:00 30 g via ORAL
  Filled 2019-08-18: qty 120

## 2019-08-18 NOTE — Progress Notes (Signed)
Patient refused CPAP tonight 

## 2019-08-18 NOTE — TOC Progression Note (Signed)
Transition of Care [facility]) - Progression Note    Patient Details  Name: [name] MRN: [id number] Date of Birth: 08/23/1933  Transition of Care [facility]) CM/SW [place], [place] Phone Number: [phone] 08/18/2019, 4:29 PM  Clinical Narrative:     CSW spoke with RN Shanon Brow about case consult for SNF. CSW informed RN that OT/PT would need to update recommendation for insurance purposes due to patient's daughter not able to provide 24 hours supervision. RN informed CSW that he would put in a consult for PT/OT to complete another evaluation.  TOC team will continue to follow for discharge planning.    Expected Discharge Plan: [place] Barriers to Discharge: Continued Medical Work up  Expected Discharge Plan and Services Expected Discharge Plan: [place]                                               Social Determinants of Health (SDOH) Interventions    Readmission Risk Interventions Readmission Risk Prevention Plan 01/03/2019 01/02/2019  Transportation Screening (No Data) Complete  PCP or Specialist Appt within 3-5 Days - Complete  HRI or [place] - Complete  Social Work Consult for [place] Planning/Counseling - Complete  Palliative Care Screening - Not Applicable  Medication Review [employer] photographer) - Complete  Some recent data might be hidden

## 2019-08-18 NOTE — Progress Notes (Signed)
PROGRESS NOTE    [name]  QIO:[id number] DOB: 20-Dec-1932 DOA: 08/10/2019 PCP: [name], MD    Brief Narrative:  85 year old lady with prior h/o systolic heart failure, stage 3 CKD, s/p PPM,  Breast cancer s/p neo adjuvant hormone therapy, DVT, PE on eliquis, presents with syncope.   She is found to have recurrent DVT and syncope.  She is started on heparin and coumadin , later on transitioned to lovenox and coumadin. . INR is 1.8 today. Plan to discharge the patient home with home care services but after speaking with the daughter over the phone family and the patient would like to pursue SNF placement.  Social worker is aware.    Assessment & Plan:   Principal Problem:   DVT (deep venous thrombosis) (HCC) Active Problems:   CKD (chronic kidney disease), stage III (HCC)   HTN (hypertension)   DM (diabetes mellitus) ([place])   Malignant neoplasm of lower-inner quadrant of right breast of female, estrogen receptor positive ([place])   Syncope   Recurrent DVT:  ? Non compliance to  eliquis vs medication failure.  Restarted her on Coumadin her INR is 1.8 continue with Lovenox and Coumadin.    Syncope:  Sec to hypoglycemia vs orthostatic hypotension.  CT head negative.  PPM interrogated and no arrhythmias.  Decreased the dose of torsemide and TED hose ordered.  She denies any dizziness or syncope. No new complaints at this time.   H/o breast cancer:  Follows up with Dr. Jacqualine Mau as outpatient.   Type 2 DM: Uncontrolled with hyperglycemia CBG (last 3)  Recent Labs    08/18/19 0603 08/18/19 1222 08/18/19 1602  GLUCAP 171* 245* 303*   Restart Levemir at 14 units in the morning and 8 units at night and continue with sliding scale insulin. Hemoglobin A1c is 7.9   Hypertension;  Well-controlled   Stage 3 CKD:  Slight worsening of creatinine from 1.7-2 possibly from IV Lasix.  Recheck creatinine in the morning.  Chronic systolic and diastolic heart failure.   She appears to be euvolemic   Hyperkalemia 1 dose of Kayexalate given.  Repeat potassium in the morning.   OSA: BiPAP at night   H/o recurrent SVT/ LBBB: S/P PPM.    Enterobacter UTI:  Completed the course of cefnidir.   Constipation:  Resolved   Cough :  CXR show pulm vascular congestion, .  Diuresed appropriately Demadex Resolved  Anemia of chronic disease Hemoglobin stable around 9.  No signs of bleeding.  DVT prophylaxis: coumadin .  Code Status: full code.  Family Communication: none at bedside.  Disposition Plan: pending therapeutic INR.  And SNF placement   Consultants:   NONE.    Procedures: NONE.    Antimicrobials: ON CEFNIDIR.    Subjective: Patient reports having 3 bowel movements today after Kayexalate.  Patient denies any chest pain, shortness of breath, nausea vomiting or abdominal pain  Objective: Vitals:   08/18/19 0904 08/18/19 0917 08/18/19 1634 08/18/19 1636  BP: 131/66  123/70   Pulse: 64 67 67 71  Resp: 18  18   Temp: 98.8 F (37.1 C)  98.8 F (37.1 C)   TempSrc: Oral  Oral   SpO2: 100%  100%   Weight:      Height:        Intake/Output Summary (Last 24 hours) at 08/18/2019 1748 Last data filed at 08/18/2019 1128 Gross per 24 hour  Intake 200 ml  Output 2200 ml  Net -2000 ml  Filed Weights   08/16/19 0626 08/17/19 0330 08/18/19 0444  Weight: 78 kg 77.9 kg 77 kg    Examination:  General exam: Alert and comfortable not in any kind of distress. Respiratory system: Clear to auscultation bilaterally, no wheezing or rhonchi Cardiovascular system: S1-S2 heard, regular rate rhythm, trace pedal edema. Gastrointestinal system: Abdomen is soft, nontender, nondistended, bowel sounds are good Central nervous system: Alert and oriented, grossly nonfocal Extremities: Trace pedal edema, no cyanosis or clubbing Skin: No rashes seen Psychiatry: Mood is appropriate   Data Reviewed: I have personally reviewed following labs  and imaging studies  CBC: Recent Labs  Lab 08/14/19 0329 08/15/19 0318 08/16/19 0338 08/17/19 0335 08/18/19 0325  WBC 3.0* 3.6* 2.9* 3.1* 3.5*  HGB 8.7* 9.2* 8.9* 9.5* 9.3*  HCT 27.1* 29.6* 28.2* 30.0* 29.0*  MCV 95.1 95.5 94.6 95.8 94.8  PLT 108* 111* 114* 127* 952*   Basic Metabolic Panel: Recent Labs  Lab 08/13/19 0337 08/14/19 0329 08/15/19 0953 08/16/19 0338 08/18/19 0325  NA 140 140  --  138 137  K 3.9 3.9  --  4.4 5.2*  CL 103 106  --  102 102  CO2 28 26  --  26 25  GLUCOSE 140* 93  --  88 187*  BUN 37* 36*  --  32* 38*  CREATININE 1.96* 1.83* 1.78* 1.78* 2.19*  CALCIUM 8.7* 8.7*  --  9.0 8.7*   GFR: Estimated Creatinine Clearance: 19.6 mL/min (A) (by C-G formula based on SCr of 2.19 mg/dL (H)). Liver Function Tests: No results for input(s): AST, ALT, ALKPHOS, BILITOT, PROT, ALBUMIN in the last 168 hours. No results for input(s): LIPASE, AMYLASE in the last 168 hours. No results for input(s): AMMONIA in the last 168 hours. Coagulation Profile: Recent Labs  Lab 08/14/19 0329 08/15/19 0318 08/16/19 0338 08/17/19 0335 08/18/19 0325  INR 1.1 1.1 1.3* 1.5* 1.8*   Cardiac Enzymes: No results for input(s): CKTOTAL, CKMB, CKMBINDEX, TROPONINI in the last 168 hours. BNP (last 3 results) No results for input(s): PROBNP in the last 8760 hours. HbA1C: No results for input(s): HGBA1C in the last 72 hours. CBG: Recent Labs  Lab 08/17/19 1629 08/17/19 2104 08/18/19 0603 08/18/19 1222 08/18/19 1602  GLUCAP 263* 244* 171* 245* 303*   Lipid Profile: No results for input(s): CHOL, HDL, LDLCALC, TRIG, CHOLHDL, LDLDIRECT in the last 72 hours. Thyroid Function Tests: No results for input(s): TSH, T4TOTAL, FREET4, T3FREE, THYROIDAB in the last 72 hours. Anemia Panel: No results for input(s): VITAMINB12, FOLATE, FERRITIN, TIBC, IRON, RETICCTPCT in the last 72 hours. Sepsis Labs: No results for input(s): PROCALCITON, LATICACIDVEN in the last 168 hours.  Recent  Results (from the past 240 hour(s))  Urine culture     Status: Abnormal   Collection Time: 08/10/19  4:25 PM   Specimen: Urine, Random  Result Value Ref Range Status   Specimen Description URINE, RANDOM  Final   Special Requests   Final    NONE Performed at [facility], [address], [place], [place] [address]    Culture >=100,000 COLONIES/mL ENTEROBACTER HORMAECHEI (A)  Final   Report Status 08/13/2019 FINAL  Final   Organism ID, Bacteria ENTEROBACTER HORMAECHEI (A)  Final      Susceptibility   Enterobacter hormaechei - MIC*    CEFAZOLIN >=64 RESISTANT Resistant     CEFTRIAXONE <=1 SENSITIVE Sensitive     CIPROFLOXACIN <=0.25 SENSITIVE Sensitive     GENTAMICIN <=1 SENSITIVE Sensitive     IMIPENEM  0.5 SENSITIVE Sensitive     NITROFURANTOIN 32 SENSITIVE Sensitive     TRIMETH/SULFA <=20 SENSITIVE Sensitive     PIP/TAZO <=4 SENSITIVE Sensitive     * >=100,000 COLONIES/mL ENTEROBACTER HORMAECHEI  SARS CORONAVIRUS 2 (TAT 6-24 HRS) Nasopharyngeal Nasopharyngeal Swab     Status: None   Collection Time: 08/10/19  6:24 PM   Specimen: Nasopharyngeal Swab  Result Value Ref Range Status   SARS Coronavirus 2 NEGATIVE NEGATIVE Final    Comment: (NOTE) SARS-CoV-2 target nucleic acids are NOT DETECTED. The SARS-CoV-2 RNA is generally detectable in upper and lower respiratory specimens during the acute phase of infection. Negative results do not preclude SARS-CoV-2 infection, do not rule out co-infections with other pathogens, and should not be used as the sole basis for treatment or other patient management decisions. Negative results must be combined with clinical observations, patient history, and epidemiological information. The expected result is Negative. Fact Sheet for Patients: SugarRoll.be Fact Sheet for Healthcare Providers: https://www.woods-mathews.com/ This test is not yet approved or cleared by the [place] FDA and  has  been authorized for detection and/or diagnosis of SARS-CoV-2 by FDA under an Emergency Use Authorization (EUA). This EUA will remain  in effect (meaning this test can be used) for the duration of the COVID-19 declaration under Section 56 4(b)(1) of the Act, 21 U.S.C. section 360bbb-3(b)(1), unless the authorization is terminated or revoked sooner. Performed at [facility], [place] [address], [place], [place] [address]          Radiology Studies: No results found.      Scheduled Meds: . amiodarone  200 mg Oral Daily  . anastrozole  1 mg Oral Daily  . atorvastatin  20 mg Oral QHS  . calcium carbonate  1,250 mg Oral Q breakfast  . carvedilol  12.5 mg Oral BID WC  . diclofenac sodium  2 g Topical QHS  . enoxaparin (LOVENOX) injection  80 mg Subcutaneous Q24H  . ferrous sulfate  325 mg Oral Q breakfast  . influenza vaccine adjuvanted  0.5 mL Intramuscular Tomorrow-1000  . insulin aspart  0-9 Units Subcutaneous TID WC  . [START ON 08/19/2019] insulin detemir  14 Units Subcutaneous Daily  . insulin detemir  8 Units Subcutaneous QHS  . isosorbide-hydrALAZINE  1 tablet Oral TID  . magnesium oxide  400 mg Oral Daily  . oxybutynin  5 mg Oral BID  . pantoprazole  80 mg Oral Daily  . pregabalin  75 mg Oral QHS  . sodium chloride flush  3 mL Intravenous Q12H  . Warfarin - Pharmacist Dosing Inpatient   Does not apply q1800   Continuous Infusions:    LOS: 7 days        Hosie Poisson, MD [facility] Pager [phone]  If 7PM-7AM, please contact night-coverage [email or web address] Password [facility] 08/18/2019, 5:48 PM

## 2019-08-18 NOTE — Progress Notes (Signed)
ANTICOAGULATION CONSULT NOTE - Follow Up Consult  Pharmacy Consult for heparin to Lovenox  / Warfarin Indication: DVT  Labs: Recent Labs    08/15/19 0953  08/16/19 0338 08/17/19 0335 08/18/19 0325  HGB  --    < > 8.9* 9.5* 9.3*  HCT  --   --  28.2* 30.0* 29.0*  PLT  --   --  114* 127* 124*  LABPROT  --   --  16.2* 18.1* 20.8*  INR  --   --  1.3* 1.5* 1.8*  CREATININE 1.78*  --  1.78*  --  2.19*   < > = values in this interval not displayed.    Assessment: 85yo female continues on heparin / Warfarin, was on Eliquis prior to admission (? Compliance, failure)  Switched heparin to Lovenox, CrCl< 30 ml / min  Warfarin started 10/19; INR slow to increase - > was on 10 mg / 7.5 mg doses when she took Coumadin before.    No overt bleeding or complications noted.   Goal of Therapy:  Appropriate Lovenox dosing INR = 2 to 3   Plan:  Continue Lovenox 80 mg sq Q 24 hours Warfarin 12.5 mg po x 1 again tonight. Will need therapeutic INR over 2 x 24 hrs before stopping Lovenox. Daily INR and CBC  [place] Crane, Roylene Reason, [facility] Clinical Pharmacist Phone [phone]  08/18/2019 8:09 AM

## 2019-08-19 ENCOUNTER — Inpatient Hospital Stay: Payer: Medicare Other | Admitting: Hematology and Oncology

## 2019-08-19 ENCOUNTER — Ambulatory Visit: Payer: Medicare Other | Admitting: Hematology and Oncology

## 2019-08-19 ENCOUNTER — Inpatient Hospital Stay (HOSPITAL_COMMUNITY): Payer: Medicare Other

## 2019-08-19 ENCOUNTER — Inpatient Hospital Stay: Payer: Medicare Other

## 2019-08-19 ENCOUNTER — Other Ambulatory Visit: Payer: Medicare Other

## 2019-08-19 DIAGNOSIS — N184 Chronic kidney disease, stage 4 (severe): Secondary | ICD-10-CM | POA: Diagnosis not present

## 2019-08-19 DIAGNOSIS — E1122 Type 2 diabetes mellitus with diabetic chronic kidney disease: Secondary | ICD-10-CM | POA: Diagnosis not present

## 2019-08-19 DIAGNOSIS — I824Z1 Acute embolism and thrombosis of unspecified deep veins of right distal lower extremity: Secondary | ICD-10-CM | POA: Diagnosis not present

## 2019-08-19 DIAGNOSIS — R05 Cough: Secondary | ICD-10-CM | POA: Diagnosis not present

## 2019-08-19 LAB — BASIC METABOLIC PANEL
Anion gap: 14 (ref 5–15)
BUN: 44 mg/dL — ABNORMAL HIGH (ref 8–23)
CO2: 24 mmol/L (ref 22–32)
Calcium: 8.5 mg/dL — ABNORMAL LOW (ref 8.9–10.3)
Chloride: 104 mmol/L (ref 98–111)
Creatinine, Ser: 2.06 mg/dL — ABNORMAL HIGH (ref 0.44–1.00)
GFR calc Af Amer: 25 mL/min — ABNORMAL LOW (ref >60)
GFR calc non Af Amer: 22 mL/min — ABNORMAL LOW (ref >60)
Glucose, Bld: 65 mg/dL — ABNORMAL LOW (ref 70–99)
Potassium: 3.4 mmol/L — ABNORMAL LOW (ref 3.5–5.1)
Sodium: 142 mmol/L (ref 135–145)

## 2019-08-19 LAB — PROTIME-INR
INR: 2.3 — ABNORMAL HIGH (ref 0.8–1.2)
Prothrombin Time: 25.3 seconds — ABNORMAL HIGH (ref 11.4–15.2)

## 2019-08-19 LAB — GLUCOSE, CAPILLARY
Glucose-Capillary: 63 mg/dL — ABNORMAL LOW (ref 70–99)
Glucose-Capillary: 138 mg/dL — ABNORMAL HIGH (ref 70–99)
Glucose-Capillary: 226 mg/dL — ABNORMAL HIGH (ref 70–99)
Glucose-Capillary: 204 mg/dL — ABNORMAL HIGH (ref 70–99)

## 2019-08-19 MED ORDER — INSULIN ASPART 100 UNIT/ML ~~LOC~~ SOLN
2.0000 [IU] | Freq: Three times a day (TID) | SUBCUTANEOUS | Status: DC
  Administered 2019-08-20 – 2019-08-22 (×7): 2 [IU] via SUBCUTANEOUS

## 2019-08-19 MED ORDER — WARFARIN SODIUM 7.5 MG PO TABS
7.5000 mg | ORAL_TABLET | Freq: Once | ORAL | Status: AC
  Administered 2019-08-19: 7.5 mg via ORAL
  Filled 2019-08-19: qty 1

## 2019-08-19 NOTE — Care Management Important Message (Signed)
Important Message  Patient Details  Name: [name] MRN: [id number] Date of Birth: April 19, 1933   Medicare Important Message Given:  Yes     [name] 08/19/2019, 1:56 PM

## 2019-08-19 NOTE — Progress Notes (Signed)
Physical Therapy Treatment Patient Details Name: OLEDA BORSKI MRN: [id number] DOB: 05-Apr-1933 Today's Date: 08/19/2019    History of Present Illness 85 y.o. female with medical history significant of DVT and PE on eliquis, breast cancer undergoing neoadjuvant hormone therapy, chronic CHF with EF 40-45%, CKD stage 3, sinus pauses s/p PPM. She presented to the ED after a syncopal episode at home. R ankle fx diagnosed 07/09/19. Doppler revealed acute DVT R distal LE.    PT Comments    Pt tolerated treatment well. Pt remains limited in functional mobility, transfers, and ambulation, by LE weakness and ankle fracture. Per MD note on 9/15 pt is WBAT in air cast for RLE for transfers, limit ambulation. Pt continues to require physical assistance for transfers. Due to limited assistance at home during the day pt will be at a high falls risk if attempting to mobilize alone. Pt will benefit from continued acute PT services to improve transfer quality and wheelchair mobility. Pt will benefit from short term rehab stay to improve LE strength and power and reduce falls risk.   Follow Up Recommendations  SNF     Equipment Recommendations  (defer to sub-acute setting)    Recommendations for Other Services       Precautions / Restrictions Precautions Precautions: Fall Precaution Comments: L supraspinatus tear also noted from orthopedic note on 07/09/19 Required Braces or Orthoses: Other Brace Other Brace: air cast RLE for transfers, limit ambulation Restrictions Weight Bearing Restrictions: Yes RLE Weight Bearing: Weight bearing as tolerated(in air cast)    Mobility  Bed Mobility Overal bed mobility: (pt received up in recliner)                Transfers Overall transfer level: Needs assistance Equipment used: Rolling walker (2 wheeled) Transfers: Sit to/from Stand Sit to Stand: Min assist            Ambulation/Gait Ambulation/Gait assistance: Min guard Gait Distance (Feet): 30  Feet Assistive device: Rolling walker (2 wheeled) Gait Pattern/deviations: Step-to pattern;Decreased step length - right;Decreased step length - left;Wide base of support Gait velocity: reduced Gait velocity interpretation: <1.31 ft/sec, indicative of household ambulator General Gait Details: pt with short step to gait with reduced step length bilaterally. Pt with forward flexed trunk and kyphotic posture. Pt with R knee valgus and R ankle inversion during stance phase   Stairs             Wheelchair Mobility    Modified Rankin (Stroke Patients Only)       Balance Overall balance assessment: Needs assistance;History of Falls Sitting-balance support: No upper extremity supported;Feet supported Sitting balance-Leahy Scale: Fair Sitting balance - Comments: supervision of one person   Standing balance support: Bilateral upper extremity supported Standing balance-Leahy Scale: Fair Standing balance comment: minG with BUE support of RW                            Cognition Arousal/Alertness: Awake/alert Behavior During Therapy: WFL for tasks assessed/performed Overall Cognitive Status: Within Functional Limits for tasks assessed                                        Exercises      General Comments        Pertinent Vitals/Pain Pain Assessment: Faces Faces Pain Scale: Hurts little more Pain Location: RLE Pain Descriptors / Indicators:  Aching Pain Intervention(s): Limited activity within patient's tolerance    Home Living                      Prior Function            PT Goals (current goals can now be found in the care plan section) Acute Rehab PT Goals Patient Stated Goal: to stand up with less difficulty Progress towards PT goals: Progressing toward goals    Frequency    Min 3X/week      PT Plan Current plan remains appropriate    Co-evaluation              AM-PAC PT "6 Clicks" Mobility   Outcome  Measure  Help needed turning from your back to your side while in a flat bed without using bedrails?: A Little Help needed moving from lying on your back to sitting on the side of a flat bed without using bedrails?: A Little Help needed moving to and from a bed to a chair (including a wheelchair)?: A Little Help needed standing up from a chair using your arms (e.g., wheelchair or bedside chair)?: A Little Help needed to walk in hospital room?: A Little Help needed climbing 3-5 steps with a railing? : A Lot 6 Click Score: 17    End of Session Equipment Utilized During Treatment: Gait belt Activity Tolerance: Patient tolerated treatment well Patient left: in chair;with call bell/phone within reach Nurse Communication: Mobility status PT Visit Diagnosis: Other abnormalities of gait and mobility (R26.89);History of falling (Z91.81);Muscle weakness (generalized) (M62.81)     Time: 5035-4656 PT Time Calculation (min) (ACUTE ONLY): 23 min  Charges:  $Gait Training: 23-37 mins                     Zenaida Niece, PT, DPT Acute Rehabilitation Pager: [phone]    Zenaida Niece 08/19/2019, 3:18 PM

## 2019-08-19 NOTE — Progress Notes (Signed)
Inpatient Diabetes Program Recommendations  AACE/ADA: New Consensus Statement on Inpatient Glycemic Control (2015)  Target Ranges:  Prepandial:   less than 140 mg/dL      Peak postprandial:   less than 180 mg/dL (1-2 hours)      Critically ill patients:  140 - 180 mg/dL   Lab Results  Component Value Date   GLUCAP 138 (H) 08/19/2019   HGBA1C 7.9 (H) 06/08/2019    Review of Glycemic Control Results for [name], [name] (MRN [id number]) as of 08/19/2019 11:50  Ref. Range 08/18/2019 16:02 08/18/2019 20:53 08/19/2019 06:07 08/19/2019 06:40  Glucose-Capillary Latest Ref Range: 70 - 99 mg/dL 303 (H) 151 (H) 63 (L) 138 (H)   Diabetes history: DM 2 Outpatient Diabetes medications:  Humalog 4-8 units tid with meals, Levemir 16 units AM and 8 units PM  Current orders for Inpatient glycemic control:  Levemir 14 units q AM and 8 units q PM Novolog sensitive tid with meals  Inpatient Diabetes Program Recommendations:   Note low fasting CBG this AM.  Consider holding PM dose of Levemir tonight.  Also consider adding Novolog meal coverage 2 units tid with meals to prevent spikes in post-prandial CBG's.   Thanks,  Adah Perl, RN, BC-ADM Inpatient Diabetes Coordinator Pager [phone] (8a-5p)

## 2019-08-19 NOTE — Assessment & Plan Note (Deleted)
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  Patient went to [place] and has seen [facility] medical oncologist who felt that the tumor has shrunk in size and that she could be amenable to surgery. 3.  We will request Dr. Donne Hazel to see the patient to consider surgical options. 4.  I will follow her up after surgery to discuss pathology report.  She will likely need adjuvant radiation followed by antiestrogen therapy continuation with anastrozole. ---------------------------------------------------------------- Hospitalization 08/10/2019 for syncope and recurrent DVT

## 2019-08-19 NOTE — Progress Notes (Signed)
Hypoglycemic Event  CBG: 63  Treatment: 8 oz juice/soda  Symptoms: None  Follow-up CBG: EUVH:[id number] CBG Result:138  Possible Reasons for Event: Medication regimen: Change in basal insulin  Comments/MD notified:n/a    Felecia Jan

## 2019-08-19 NOTE — Evaluation (Signed)
Occupational Therapy Evaluation Patient Details Name: [name] MRN: [id number] DOB: 1933-04-04 Today's Date: 08/19/2019    History of Present Illness 85 y.o. female with medical history significant of DVT and PE on eliquis, breast cancer undergoing neoadjuvant hormone therapy, chronic CHF with EF 40-45%, CKD stage 3, sinus pauses s/p PPM. She presented to the ED after a syncopal episode at home. R ankle fx diagnosed 07/09/19. Doppler revealed acute DVT R distal LE.   Clinical Impression   Pt admitted with above. She demonstrates the below listed deficits and will benefit from continued OT to maximize safety and independence with BADLs.  Pt presents to OT with impaired balance, decreased activity tolerance.  She requires min guard assist for functional transfers and set up to mod A for ADLs.  She reports she lives with her daughter and required assist for ADLs and was using a w/c at home due to recent ankle fx.  Daughter works during the day and pt with limited support and h/o falls.  Recommend SNF level rehab to allow her to maximize safety and independence with ADLs and reduce risk of falls, injury, and readmission.       Follow Up Recommendations  SNF;Supervision - Intermittent    Equipment Recommendations  None recommended by OT    Recommendations for Other Services       Precautions / Restrictions Precautions Precautions: Fall Precaution Comments: L supraspinatus tear also noted from orthopedic note on 07/09/19 Required Braces or Orthoses: Other Brace Other Brace: air cast RLE for transfers/ambulation Restrictions Weight Bearing Restrictions: Yes RLE Weight Bearing: Weight bearing as tolerated      Mobility Bed Mobility Overal bed mobility: Needs Assistance Bed Mobility: Supine to Sit     Supine to sit: Supervision     General bed mobility comments: increased time   Transfers Overall transfer level: Needs assistance Equipment used: Rolling walker (2  wheeled) Transfers: Sit to/from [facility] Sit to Stand: Min guard Stand pivot transfers: Min guard       General transfer comment: min guard for safety     Balance Overall balance assessment: Needs assistance;History of Falls Sitting-balance support: No upper extremity supported;Feet supported Sitting balance-Leahy Scale: Fair     Standing balance support: Bilateral upper extremity supported;During functional activity Standing balance-Leahy Scale: Poor Standing balance comment: reliant on external support                           ADL either performed or assessed with clinical judgement   ADL Overall ADL's : Needs assistance/impaired Eating/Feeding: Independent   Grooming: Wash/dry hands;Wash/dry face;Oral care;Brushing hair;Min guard;Standing   Upper Body Bathing: Supervision/ safety;Min guard;Sitting Upper Body Bathing Details (indicate cue type and reason): assist with back  Lower Body Bathing: Moderate assistance;Sit to/from stand   Upper Body Dressing : Set up;Sitting   Lower Body Dressing: Maximal assistance;Sit to/from stand Lower Body Dressing Details (indicate cue type and reason): unable to access feet  Toilet Transfer: Min guard;Stand-pivot;BSC;RW   Toileting- Clothing Manipulation and Hygiene: Min guard;Sit to/from stand       Functional mobility during ADLs: Min guard;Rolling walker       Vision Baseline Vision/History: Wears glasses Wears Glasses: At all times Patient Visual Report: No change from baseline       Perception     Praxis      Pertinent Vitals/Pain Pain Assessment: No/denies pain     Hand Dominance Right   Extremity/Trunk Assessment Upper  Extremity Assessment Upper Extremity Assessment: Generalized weakness;RUE deficits/detail RUE Deficits / Details: recent supraspinatus tear per ortho notes 07/09/2019   Lower Extremity Assessment Lower Extremity Assessment: Generalized weakness;RLE  deficits/detail RLE Deficits / Details: recent malleolar and fib fx 07/09/2019   Cervical / Trunk Assessment Cervical / Trunk Assessment: Kyphotic   Communication Communication Communication: No difficulties   Cognition Arousal/Alertness: Awake/alert Behavior During Therapy: WFL for tasks assessed/performed Overall Cognitive Status: Within Functional Limits for tasks assessed                                 General Comments: WFL for tasks assessed    General Comments       Exercises     Shoulder Instructions      Home Living Family/patient expects to be discharged to:: Private residence Living Arrangements: Children Available Help at Discharge: Family;Available PRN/intermittently Type of Home: Apartment Home Access: Level entry     Home Layout: One level         Bathroom Toilet: Standard Bathroom Accessibility: Yes How Accessible: Accessible via walker Home Equipment: Rhine - 4 wheels;Wheelchair - [employer] - 2 wheels;Bedside commode;Tub bench;Grab bars - toilet   Additional Comments: pt reports she recently moved in to her daughters home after finishing SNF rehab stay.  Pt reports daughter works during the day, so she is alone with check ins from grand daughter, and has a "care taker", "who can't do much" that she can call if she needs something        Prior Functioning/Environment Level of Independence: Needs assistance  Gait / Transfers Assistance Needed: Pt reports she was using w/c in the home since ankle fx, and she reports h/o falls  ADL's / Homemaking Assistance Needed: Pt reports daughter assists with bathing and dressing    Comments: Pt active with HHPT/OT at time of admission.        OT Problem List: Decreased strength;Decreased activity tolerance;Impaired balance (sitting and/or standing);Decreased knowledge of use of DME or AE;Impaired UE functional use      OT Treatment/Interventions: Self-care/ADL training;Therapeutic  exercise;DME and/or AE instruction;Therapeutic activities;Patient/family education;Balance training    OT Goals(Current goals can be found in the care plan section) Acute Rehab OT Goals Patient Stated Goal: to be able to stand and bathe at the sink  OT Goal Formulation: With patient Time For Goal Achievement: 09/02/19 Potential to Achieve Goals: Good  OT Frequency: Min 2X/week   Barriers to D/C: Decreased caregiver support          Co-evaluation              AM-PAC OT "6 Clicks" Daily Activity     Outcome Measure Help from another person eating meals?: None Help from another person taking care of personal grooming?: A Little Help from another person toileting, which includes using toliet, bedpan, or urinal?: A Little Help from another person bathing (including washing, rinsing, drying)?: A Lot Help from another person to put on and taking off regular upper body clothing?: A Little Help from another person to put on and taking off regular lower body clothing?: A Lot 6 Click Score: 17   End of Session Equipment Utilized During Treatment: Gait belt;Other (comment)(air cast Rt ankle ) Nurse Communication: Mobility status  Activity Tolerance: Patient tolerated treatment well Patient left: in chair;with call bell/phone within reach  OT Visit Diagnosis: Unsteadiness on feet (R26.81)  Time: 7253-6644 OT Time Calculation (min): 24 min Charges:  OT General Charges $OT Visit: 1 Visit OT Evaluation $OT Eval Moderate Complexity: 1 Mod OT Treatments $Self Care/Home Management : 8-22 mins  Lucille Passy, OTR/L Acute Rehabilitation Services Pager [phone] Office [phone]   Lucille Passy M 08/19/2019, 10:30 AM

## 2019-08-19 NOTE — Progress Notes (Signed)
PROGRESS NOTE    [name]  EHU:[id number] DOB: [birth year] DOA: 08/10/2019 PCP: [name], MD    Brief Narrative:  85 year old lady with prior h/o systolic heart failure, stage 3 CKD, s/p PPM,  Breast cancer s/p neo adjuvant hormone therapy, DVT, PE on eliquis, presents with syncope.   She is found to have recurrent DVT and syncope.  She is started on heparin and coumadin , later on transitioned to lovenox and coumadin. . INR is 1.8 today. Plan to discharge the patient home with home care services but after speaking with the daughter over the phone family and the patient would like to pursue SNF placement.  Social worker is aware.    Assessment & Plan:   Principal Problem:   DVT (deep venous thrombosis) (HCC) Active Problems:   CKD (chronic kidney disease), stage III (HCC)   HTN (hypertension)   DM (diabetes mellitus) ([place])   Malignant neoplasm of lower-inner quadrant of right breast of female, estrogen receptor positive ([place])   Syncope   Recurrent DVT:  ? Non compliance to  eliquis vs medication failure.  INR is therapeutic, discontinued Lovenox, continue with Coumadin for now.    Syncope:  Sec to hypoglycemia vs orthostatic hypotension.  CT head negative.  PPM interrogated and no arrhythmias.  Decreased the dose of torsemide and TED hose ordered.  She denies any dizziness or syncope. No new complaints at this time.   H/o breast cancer:  Follows up with Dr. Lindi Adie as outpatient   Type 2 DM: Uncontrolled with hyperglycemia CBG (last 3)  Recent Labs    08/19/19 0607 08/19/19 0640 08/19/19 1214  GLUCAP 63* 138* 226*   Hypoglycemic episode earlier this morning.  Stop Levemir 8 units at midnight.  Continue with daily Levemir 14 units in the morning.  And continue with sliding scale insulin.   Hypertension;  Well-controlled   Stage 3 CKD:  Baseline creatinine around 1.7 currently it said at 2.  continue to monitor  Chronic systolic and diastolic  heart failure.  She appears to be euvolemic.   Hyperkalemia Resolved   OSA: BiPAP at night   H/o recurrent SVT/ LBBB: S/P PPM.    Enterobacter UTI:  Completed the course of cefdinir  Constipation:  Resolved   Cough :  Chest x-ray showed pulmonary congestion.  IV Lasix x2 ordered Repeat chest x-ray for follow-up.  Anemia of chronic disease Hemoglobin stable around 9 no signs of bleeding.  DVT prophylaxis: coumadin .  Code Status: full code.  Family Communication: none at bedside.  Disposition Plan: pending therapeutic INR.  And SNF placement   Consultants:   NONE.    Procedures: NONE.    Antimicrobials: ON CEFNIDIR.    Subjective: Constipation resolved.  Patient reports some cough and yellow colored phlegm. She is at room air with good oxygen saturations will repeat chest x-ray for further evaluation.  Objective: Vitals:   08/18/19 1634 08/18/19 1636 08/18/19 2050 08/19/19 0348  BP: 123/70  119/63 102/66  Pulse: 67 71 73 67  Resp: 18  14 19   Temp: 98.8 F (37.1 C)  98.8 F (37.1 C) 98.4 F (36.9 C)  TempSrc: Oral  Oral Oral  SpO2: 100%  100% 99%  Weight:    76.9 kg  Height:       No intake or output data in the 24 hours ending 08/19/19 1337 Filed Weights   08/17/19 0330 08/18/19 0444 08/19/19 0348  Weight: 77.9 kg 77 kg 76.9 kg  Examination:  General exam: Alert and comfortable, not in any kind of distress. Respiratory system: Air entry fair bilaterally, no wheezing or rhonchi Cardiovascular system: S1-S2 heard, regular rate rhythm, trace pedal edema. Gastrointestinal system: Abdomen is soft, nontender, nondistended, bowel sounds are heard. Central nervous system: Alert and oriented Extremities: No cyanosis or clubbing Skin: No rashes seen Psychiatry: Mood is appropriate   Data Reviewed: I have personally reviewed following labs and imaging studies  CBC: Recent Labs  Lab 08/14/19 0329 08/15/19 0318 08/16/19 0338  08/17/19 0335 08/18/19 0325  WBC 3.0* 3.6* 2.9* 3.1* 3.5*  HGB 8.7* 9.2* 8.9* 9.5* 9.3*  HCT 27.1* 29.6* 28.2* 30.0* 29.0*  MCV 95.1 95.5 94.6 95.8 94.8  PLT 108* 111* 114* 127* 841*   Basic Metabolic Panel: Recent Labs  Lab 08/13/19 0337 08/14/19 0329 08/15/19 0953 08/16/19 0338 08/18/19 0325 08/19/19 0337  NA 140 140  --  138 137 142  K 3.9 3.9  --  4.4 5.2* 3.4*  CL 103 106  --  102 102 104  CO2 28 26  --  26 25 24   GLUCOSE 140* 93  --  88 187* 65*  BUN 37* 36*  --  32* 38* 44*  CREATININE 1.96* 1.83* 1.78* 1.78* 2.19* 2.06*  CALCIUM 8.7* 8.7*  --  9.0 8.7* 8.5*   GFR: Estimated Creatinine Clearance: 20.9 mL/min (A) (by C-G formula based on SCr of 2.06 mg/dL (H)). Liver Function Tests: No results for input(s): AST, ALT, ALKPHOS, BILITOT, PROT, ALBUMIN in the last 168 hours. No results for input(s): LIPASE, AMYLASE in the last 168 hours. No results for input(s): AMMONIA in the last 168 hours. Coagulation Profile: Recent Labs  Lab 08/15/19 0318 08/16/19 0338 08/17/19 0335 08/18/19 0325 08/19/19 0337  INR 1.1 1.3* 1.5* 1.8* 2.3*   Cardiac Enzymes: No results for input(s): CKTOTAL, CKMB, CKMBINDEX, TROPONINI in the last 168 hours. BNP (last 3 results) No results for input(s): PROBNP in the last 8760 hours. HbA1C: No results for input(s): HGBA1C in the last 72 hours. CBG: Recent Labs  Lab 08/18/19 1602 08/18/19 2053 08/19/19 0607 08/19/19 0640 08/19/19 1214  GLUCAP 303* 151* 63* 138* 226*   Lipid Profile: No results for input(s): CHOL, HDL, LDLCALC, TRIG, CHOLHDL, LDLDIRECT in the last 72 hours. Thyroid Function Tests: No results for input(s): TSH, T4TOTAL, FREET4, T3FREE, THYROIDAB in the last 72 hours. Anemia Panel: No results for input(s): VITAMINB12, FOLATE, FERRITIN, TIBC, IRON, RETICCTPCT in the last 72 hours. Sepsis Labs: No results for input(s): PROCALCITON, LATICACIDVEN in the last 168 hours.  Recent Results (from the past 240 hour(s))   Urine culture     Status: Abnormal   Collection Time: 08/10/19  4:25 PM   Specimen: Urine, Random  Result Value Ref Range Status   Specimen Description URINE, RANDOM  Final   Special Requests   Final    NONE Performed at [facility], [address], [place], [place] [address]    Culture >=100,000 COLONIES/mL ENTEROBACTER HORMAECHEI (A)  Final   Report Status 08/13/2019 FINAL  Final   Organism ID, Bacteria ENTEROBACTER HORMAECHEI (A)  Final      Susceptibility   Enterobacter hormaechei - MIC*    CEFAZOLIN >=64 RESISTANT Resistant     CEFTRIAXONE <=1 SENSITIVE Sensitive     CIPROFLOXACIN <=0.25 SENSITIVE Sensitive     GENTAMICIN <=1 SENSITIVE Sensitive     IMIPENEM 0.5 SENSITIVE Sensitive     NITROFURANTOIN 32 SENSITIVE Sensitive     TRIMETH/SULFA <=  20 SENSITIVE Sensitive     PIP/TAZO <=4 SENSITIVE Sensitive     * >=100,000 COLONIES/mL ENTEROBACTER HORMAECHEI  SARS CORONAVIRUS 2 (TAT 6-24 HRS) Nasopharyngeal Nasopharyngeal Swab     Status: None   Collection Time: 08/10/19  6:24 PM   Specimen: Nasopharyngeal Swab  Result Value Ref Range Status   SARS Coronavirus 2 NEGATIVE NEGATIVE Final    Comment: (NOTE) SARS-CoV-2 target nucleic acids are NOT DETECTED. The SARS-CoV-2 RNA is generally detectable in upper and lower respiratory specimens during the acute phase of infection. Negative results do not preclude SARS-CoV-2 infection, do not rule out co-infections with other pathogens, and should not be used as the sole basis for treatment or other patient management decisions. Negative results must be combined with clinical observations, patient history, and epidemiological information. The expected result is Negative. Fact Sheet for Patients: SugarRoll.be Fact Sheet for Healthcare Providers: https://www.woods-mathews.com/ This test is not yet approved or cleared by the [place] FDA and  has been authorized for detection and/or  diagnosis of SARS-CoV-2 by FDA under an Emergency Use Authorization (EUA). This EUA will remain  in effect (meaning this test can be used) for the duration of the COVID-19 declaration under Section 56 4(b)(1) of the Act, 21 U.S.C. section 360bbb-3(b)(1), unless the authorization is terminated or revoked sooner. Performed at [facility], [place] [address], [place], [place] [address]          Radiology Studies: No results found.      Scheduled Meds: . amiodarone  200 mg Oral Daily  . anastrozole  1 mg Oral Daily  . atorvastatin  20 mg Oral QHS  . calcium carbonate  1,250 mg Oral Q breakfast  . carvedilol  12.5 mg Oral BID WC  . diclofenac sodium  2 g Topical QHS  . ferrous sulfate  325 mg Oral Q breakfast  . influenza vaccine adjuvanted  0.5 mL Intramuscular Tomorrow-1000  . insulin aspart  0-9 Units Subcutaneous TID WC  . insulin detemir  14 Units Subcutaneous Daily  . isosorbide-hydrALAZINE  1 tablet Oral TID  . magnesium oxide  400 mg Oral Daily  . oxybutynin  5 mg Oral BID  . pantoprazole  80 mg Oral Daily  . pregabalin  75 mg Oral QHS  . sodium chloride flush  3 mL Intravenous Q12H  . warfarin  7.5 mg Oral ONCE-1800  . Warfarin - Pharmacist Dosing Inpatient   Does not apply q1800   Continuous Infusions:    LOS: 8 days        Hosie Poisson, MD [facility] Pager [phone]  If 7PM-7AM, please contact night-coverage [email or web address] Password [email or web address] 08/19/2019, 1:37 PM

## 2019-08-19 NOTE — TOC Progression Note (Signed)
Transition of Care [facility]) - Progression Note    Patient Details  Name: [name] MRN: [id number] Date of Birth: May 09, 1933  Transition of Care [facility]) CM/SW Contact  Carles Collet, RN Phone Number: 08/19/2019, 8:41 AM  Clinical Narrative:    Update- [facility] able to accept for [facility] services.     Expected Discharge Plan: [place] Barriers to Discharge: Continued Medical Work up  Expected Discharge Plan and Services Expected Discharge Plan: [place]                                               Social Determinants of Health (SDOH) Interventions    Readmission Risk Interventions Readmission Risk Prevention Plan 01/03/2019 01/02/2019  Transportation Screening (No Data) Complete  PCP or Specialist Appt within 3-5 Days - Complete  HRI or [place] - Complete  Social Work Consult for [place] Planning/Counseling - Complete  Palliative Care Screening - Not Applicable  Medication Review Press photographer) - Complete  Some recent data might be hidden

## 2019-08-19 NOTE — Progress Notes (Signed)
ANTICOAGULATION CONSULT NOTE - Follow Up Consult  Pharmacy Consult for heparin to Lovenox  / Warfarin Indication: DVT  Labs: Recent Labs    08/17/19 0335 08/18/19 0325 08/19/19 0337  HGB 9.5* 9.3*  --   HCT 30.0* 29.0*  --   PLT 127* 124*  --   LABPROT 18.1* 20.8* 25.3*  INR 1.5* 1.8* 2.3*  CREATININE  --  2.19* 2.06*    Assessment: 85yo female continues on heparin / Warfarin, was on Eliquis prior to admission (? Compliance, failure). Heparin switched Lovenox, CrCl< 30 ml / min, now off with therapeutic INR.  Goal of Therapy:  INR = 2 to 3   Plan:  -Stop enoxaparin -Warfarin 7.5mg  PO x1 tonight -Daily INR   Arrie Senate, PharmD, BCPS Clinical Pharmacist [phone] Please check AMION for all [place] numbers 08/19/2019

## 2019-08-20 ENCOUNTER — Telehealth: Payer: Self-pay

## 2019-08-20 DIAGNOSIS — I824Z1 Acute embolism and thrombosis of unspecified deep veins of right distal lower extremity: Secondary | ICD-10-CM | POA: Diagnosis not present

## 2019-08-20 DIAGNOSIS — N183 Chronic kidney disease, stage 3 unspecified: Secondary | ICD-10-CM | POA: Diagnosis not present

## 2019-08-20 DIAGNOSIS — E1122 Type 2 diabetes mellitus with diabetic chronic kidney disease: Secondary | ICD-10-CM | POA: Diagnosis not present

## 2019-08-20 DIAGNOSIS — N184 Chronic kidney disease, stage 4 (severe): Secondary | ICD-10-CM | POA: Diagnosis not present

## 2019-08-20 LAB — CBC
HCT: 31.4 % — ABNORMAL LOW (ref 36.0–46.0)
Hemoglobin: 10 g/dL — ABNORMAL LOW (ref 12.0–15.0)
MCH: 30.5 pg (ref 26.0–34.0)
MCHC: 31.8 g/dL (ref 30.0–36.0)
MCV: 95.7 fL (ref 80.0–100.0)
Platelets: 122 10*3/uL — ABNORMAL LOW (ref 150–400)
RBC: 3.28 MIL/uL — ABNORMAL LOW (ref 3.87–5.11)
RDW: 15 % (ref 11.5–15.5)
WBC: 2.7 10*3/uL — ABNORMAL LOW (ref 4.0–10.5)
nRBC: 0 % (ref 0.0–0.2)

## 2019-08-20 LAB — GLUCOSE, CAPILLARY
Glucose-Capillary: 95 mg/dL (ref 70–99)
Glucose-Capillary: 229 mg/dL — ABNORMAL HIGH (ref 70–99)
Glucose-Capillary: 234 mg/dL — ABNORMAL HIGH (ref 70–99)
Glucose-Capillary: 162 mg/dL — ABNORMAL HIGH (ref 70–99)
Glucose-Capillary: 124 mg/dL — ABNORMAL HIGH (ref 70–99)

## 2019-08-20 LAB — BASIC METABOLIC PANEL
Anion gap: 11 (ref 5–15)
BUN: 41 mg/dL — ABNORMAL HIGH (ref 8–23)
CO2: 24 mmol/L (ref 22–32)
Calcium: 8.7 mg/dL — ABNORMAL LOW (ref 8.9–10.3)
Chloride: 103 mmol/L (ref 98–111)
Creatinine, Ser: 1.74 mg/dL — ABNORMAL HIGH (ref 0.44–1.00)
GFR calc Af Amer: 31 mL/min — ABNORMAL LOW (ref >60)
GFR calc non Af Amer: 26 mL/min — ABNORMAL LOW (ref >60)
Glucose, Bld: 243 mg/dL — ABNORMAL HIGH (ref 70–99)
Potassium: 3.8 mmol/L (ref 3.5–5.1)
Sodium: 138 mmol/L (ref 135–145)

## 2019-08-20 LAB — PROTIME-INR
INR: 2.8 — ABNORMAL HIGH (ref 0.8–1.2)
Prothrombin Time: 29.2 seconds — ABNORMAL HIGH (ref 11.4–15.2)

## 2019-08-20 LAB — SARS CORONAVIRUS 2 (TAT 6-24 HRS): SARS Coronavirus 2: NEGATIVE

## 2019-08-20 MED ORDER — INSULIN DETEMIR 100 UNIT/ML ~~LOC~~ SOLN
18.0000 [IU] | Freq: Every day | SUBCUTANEOUS | Status: DC
  Administered 2019-08-21 – 2019-08-22 (×2): 18 [IU] via SUBCUTANEOUS
  Filled 2019-08-20 (×2): qty 0.18

## 2019-08-20 MED ORDER — WARFARIN SODIUM 7.5 MG PO TABS
7.5000 mg | ORAL_TABLET | Freq: Once | ORAL | Status: AC
  Administered 2019-08-20: 7.5 mg via ORAL
  Filled 2019-08-20: qty 1

## 2019-08-20 NOTE — Progress Notes (Signed)
ANTICOAGULATION CONSULT NOTE - Follow Up Consult  Pharmacy Consult for warfarin Indication: DVT  Labs: Recent Labs    08/18/19 0325 08/19/19 0337 08/20/19 0334  HGB 9.3*  --   --   HCT 29.0*  --   --   PLT 124*  --   --   LABPROT 20.8* 25.3* 29.2*  INR 1.8* 2.3* 2.8*  CREATININE 2.19* 2.06*  --     Assessment: 86 yoF on eliquis PTA with new DVT. Pt transitioned to warfarin with concern for poor compliance and/or treatment failure.   INR remains therapeutic at 2.8, enoxaparin bridge off.   Goal of Therapy:  INR = 2 to 3   Plan:  -Warfarin 7.5mg  PO x1 tonight -Daily INR   [name], PharmD, BCPS Clinical Pharmacist [phone] Please check AMION for all [place] numbers 08/20/2019

## 2019-08-20 NOTE — TOC Progression Note (Signed)
Transition of Care [facility]) - Progression Note    Patient Details  Name: [name] MRN: [id number] Date of Birth: [birth year]  Transition of Care [facility]) CM/SW [place], [place] Phone Number: 08/20/2019, 2:07 PM  Clinical Narrative:     CSW visit with the patient at bedside. CSW introduced self and explained role. Patient states she lives in the home with her daughter. Patient states her daughter works during the day and she is home alone while her daughter is at work. CSW discussed PT recommendation of ST rehab at SNF before going home. Patient agreed to [place] rehab at [facility]. Patient gave CSW permission to call her daughter. Patient's daughter expressed concerns about the patient falling at home and wanted her to go to rehab prior to coming home. CSW was given permission to fax referrals to SNF with [facility] being their first choice.   CSW will provide bed offers once available. CSW will continue to follow and assist with discharge planning.  Thurmond Butts, MSW, [facility] Clinical Social Worker [phone]    Expected Discharge Plan: Skilled Nursing Facility Barriers to Discharge: Continued Medical Work up, Ship broker, SNF Pending bed offer  Expected Discharge Plan and Services Expected Discharge Plan: [place] In-house Referral: Clinical Social Work     Living arrangements for the past 2 months: Apartment                                       Social Determinants of Health (SDOH) Interventions    Readmission Risk Interventions Readmission Risk Prevention Plan 01/03/2019 01/02/2019  Transportation Screening (No Data) Complete  PCP or Specialist Appt within 3-5 Days - Complete  HRI or [place] - Complete  Social Work Consult for [place] Planning/Counseling - Complete  Palliative Care Screening - Not Applicable  Medication Review Press photographer) - Complete  Some recent data might be hidden

## 2019-08-20 NOTE — NC FL2 (Signed)
[place] MEDICAID FL2 LEVEL OF CARE SCREENING TOOL     IDENTIFICATION  Patient Name: [name] Birthdate: January 16, 1933 Sex: female Admission Date (Current Location): 08/10/2019  [facility] and [place] Number:  Herbalist and Address:  [facility], [place] [address], [place], [place] [address]      Provider Number: [id number]  Attending Physician Name and Address:  Hosie Poisson, MD  Relative Name and Phone Number:       Current Level of Care: Hospital Recommended Level of Care: [place] Prior Approval Number:    Date Approved/Denied:   PASRR Number: [id number]  Discharge Plan: SNF    Current Diagnoses: Patient Active Problem List   Diagnosis Date Noted  . Syncope 08/10/2019  . Displaced fracture of lateral malleolus of right fibula, initial encounter for closed fracture 07/10/2019  . Pain in left shoulder 07/10/2019  . Acute CHF (congestive heart failure) ([place]) 06/09/2019  . Acute pulmonary edema (HCC)   . Personal history of breast cancer 04/22/2019  . Pressure injury of skin 01/04/2019  . Acute pancreatitis 01/02/2019  . Malignant neoplasm of lower-inner quadrant of right breast of female, estrogen receptor positive ([place]) 11/23/2018  . Hematoma of right iliopsoas muscle 11/06/2018  . Intramuscular hematoma 11/06/2018  . Hypertensive heart disease with heart failure ([place]) 09/05/2017  . Aphasia 08/31/2017  . Dysphagia 08/31/2017  . CKD (chronic kidney disease), stage III ([place])   . LBBB (left bundle branch block)   . Anemia   . HTN (hypertension)   . Obesity   . DVT (deep venous thrombosis) ([place])   . DM (diabetes mellitus) ([place]) 07/28/2015  . Fall 07/28/2015  . Warfarin-induced coagulopathy ([place]) 07/28/2015  . Acute on chronic diastolic CHF (congestive heart failure) ([place]) 03/19/2014  . Edema 08/06/2013  . SVT (supraventricular tachycardia) ([place]) 10/15/2012  . First degree AV block 10/15/2012  . Acute renal  insufficiency 10/14/2012  . Dizziness and giddiness   . Thrombocytopenia ([place]) 11/21/2011    Orientation RESPIRATION BLADDER Height & Weight     Self, Time, Situation, Place  Normal Incontinent, External catheter Weight: 172 lb 9.9 oz (78.3 kg) Height:  5\' 5"  (165.1 cm)  BEHAVIORAL SYMPTOMS/MOOD NEUROLOGICAL BOWEL NUTRITION STATUS      Continent Diet(see discharge summary)  AMBULATORY STATUS COMMUNICATION OF NEEDS Skin   Extensive Assist   Normal                       Personal Care Assistance Level of Assistance  Bathing, Feeding, Dressing Bathing Assistance: Maximum assistance Feeding assistance: Limited assistance Dressing Assistance: Maximum assistance     Functional Limitations Info  Sight, Speech, Hearing Sight Info: Impaired Hearing Info: Adequate Speech Info: Adequate    SPECIAL CARE FACTORS FREQUENCY  OT (By licensed OT), PT (By licensed PT)     PT Frequency: 5x week OT Frequency: 5x week            Contractures Contractures Info: Not present    Additional Factors Info  Code Status, Allergies, Insulin Sliding Scale Code Status Info: Full Code Allergies Info: Aspirin, Penicillins   Insulin Sliding Scale Info: insulin aspart (novoLOG) injection 0-9 Units 3x daily with meals; insulin aspart (novoLOG) injection 2 Units 3x daily with meals; insulin detemir (LEVEMIR) injection 14 Units       Current Medications (08/20/2019):  This is the current hospital active medication list Current Facility-Administered Medications  Medication Dose Route Frequency Provider Last Rate Last  Dose  . acetaminophen (TYLENOL) tablet 650 mg  650 mg Oral Q6H PRN Etta Quill, DO   650 mg at 08/19/19 4540   Or  . acetaminophen (TYLENOL) suppository 650 mg  650 mg Rectal Q6H PRN Etta Quill, DO      . amiodarone (PACERONE) tablet 200 mg  200 mg Oral Daily Jennette Kettle M, DO   200 mg at 08/20/19 0935  . anastrozole (ARIMIDEX) tablet 1 mg  1 mg Oral Daily Jennette Kettle M, DO   1 mg at 08/20/19 0935  . atorvastatin (LIPITOR) tablet 20 mg  20 mg Oral QHS Jennette Kettle M, DO   20 mg at 08/19/19 2114  . benzonatate (TESSALON) capsule 200 mg  200 mg Oral TID PRN Hosie Poisson, MD      . bisacodyl (DULCOLAX) suppository 10 mg  10 mg Rectal Daily PRN Hosie Poisson, MD      . calcium carbonate (OS-CAL - dosed in mg of elemental calcium) tablet 1,250 mg  1,250 mg Oral Q breakfast Jennette Kettle M, DO   1,250 mg at 08/20/19 0935  . carvedilol (COREG) tablet 12.5 mg  12.5 mg Oral BID WC Jennette Kettle M, DO   12.5 mg at 08/20/19 0936  . diclofenac sodium (VOLTAREN) 1 % transdermal gel 2 g  2 g Topical QHS Jennette Kettle M, DO      . diclofenac sodium (VOLTAREN) 1 % transdermal gel 2 g  2 g Topical TID PRN Etta Quill, DO      . ferrous sulfate tablet 325 mg  325 mg Oral Q breakfast Etta Quill, DO   325 mg at 08/20/19 0935  . guaiFENesin-dextromethorphan (ROBITUSSIN DM) 100-10 MG/5ML syrup 5 mL  5 mL Oral Q4H PRN Hosie Poisson, MD   5 mL at 08/19/19 2114  . influenza vaccine adjuvanted (FLUAD) injection 0.5 mL  0.5 mL Intramuscular Tomorrow-1000 Mikhail, [place], DO      . insulin aspart (novoLOG) injection 0-9 Units  0-9 Units Subcutaneous TID WC Etta Quill, DO   3 Units at 08/20/19 1244  . insulin aspart (novoLOG) injection 2 Units  2 Units Subcutaneous TID WC Hosie Poisson, MD   2 Units at 08/20/19 1244  . insulin detemir (LEVEMIR) injection 14 Units  14 Units Subcutaneous Daily Hosie Poisson, MD   14 Units at 08/20/19 0936  . ipratropium-albuterol (DUONEB) 0.5-2.5 (3) MG/3ML nebulizer solution 3 mL  3 mL Nebulization Q6H PRN Hosie Poisson, MD      . isosorbide-hydrALAZINE (BIDIL) 20-37.5 MG per tablet 1 tablet  1 tablet Oral TID Etta Quill, DO   1 tablet at 08/20/19 0935  . magnesium oxide (MAG-OX) tablet 400 mg  400 mg Oral Daily Jennette Kettle M, DO   400 mg at 08/20/19 0935  . ondansetron (ZOFRAN) tablet 4 mg  4 mg Oral Q6H PRN Etta Quill, DO       Or  . ondansetron [facility]) injection 4 mg  4 mg Intravenous Q6H PRN Etta Quill, DO      . oxybutynin (DITROPAN) tablet 5 mg  5 mg Oral BID Etta Quill, DO   5 mg at 08/20/19 0936  . pantoprazole (PROTONIX) EC tablet 80 mg  80 mg Oral Daily Etta Quill, DO   80 mg at 08/20/19 0935  . pregabalin (LYRICA) capsule 75 mg  75 mg Oral QHS Etta Quill, DO   75 mg at 08/19/19 2114  . senna-docusate (  Senokot-S) tablet 2 tablet  2 tablet Oral QHS PRN Hosie Poisson, MD      . sodium chloride flush (NS) 0.9 % injection 3 mL  3 mL Intravenous Q12H Jennette Kettle M, DO   3 mL at 08/20/19 1244  . traMADol (ULTRAM) tablet 50 mg  50 mg Oral Q6H PRN Hosie Poisson, MD      . warfarin (COUMADIN) tablet 7.5 mg  7.5 mg Oral ONCE-1800 Einar Grad, [facility]      . Warfarin - Pharmacist Dosing Inpatient   Does not apply q1800 Cristal Ford, DO         Discharge Medications: Please see discharge summary for a list of discharge medications.  Relevant Imaging Results:  Relevant Lab Results:   Additional Information SS# [id number]  [place], [place]

## 2019-08-20 NOTE — Progress Notes (Signed)
PROGRESS NOTE    [name]  ZOX:[id number] DOB: 11-10-1932 DOA: 08/10/2019 PCP: [name], MD    Brief Narrative:  85 year old lady with prior h/o systolic heart failure, stage 3 CKD, s/p PPM,  Breast cancer s/p neo adjuvant hormone therapy, DVT, PE on eliquis, presents with syncope.   She is found to have recurrent DVT and syncope.  She is started on heparin and coumadin , later on transitioned to lovenox and coumadin. . INR is 1.8 today. Plan to discharge the patient home with home care services but after speaking with the daughter over the phone family and the patient would like to pursue SNF placement.  Social worker is aware.     Assessment & Plan:   Principal Problem:   DVT (deep venous thrombosis) (HCC) Active Problems:   CKD (chronic kidney disease), stage III (HCC)   HTN (hypertension)   DM (diabetes mellitus) ([place])   Malignant neoplasm of lower-inner quadrant of right breast of female, estrogen receptor positive ([place])   Syncope   Recurrent DVT:  ? Non compliance to  eliquis vs medication failure.  INR is therapeutic, discontinued Lovenox, continue with coumadin    Syncope:  Sec to hypoglycemia vs orthostatic hypotension.  CT head negative.  PPM interrogated and no arrhythmias.  Decreased the dose of torsemide and TED hose ordered.  Pt reports some dizziness earlier today lasted for less than 10 tmin, while she is sitting in her chair and doing puzzles, her BP and CBG are wnl. Get orthostatic vital signs.    H/o breast cancer:  Follows up with Dr. Lindi Adie as outpatient   Type 2 DM: Uncontrolled with hyperglycemia CBG (last 3)  Recent Labs    08/20/19 1059 08/20/19 1237 08/20/19 1657  GLUCAP 229* 234* 162*   Stopped qhs levemir.  Increase am Levemir to 18 units daily  add novolog 2 units TIDAC.    Hypertension;  WELL CONTROLLED.    Stage 3 CKD:  Creatinine back to baseline at 1.7  Chronic systolic and diastolic heart failure.  She  appears to be euvolemic.   Hyperkalemia Resolved   OSA: BiPAP at night   H/o recurrent SVT/ LBBB: S/P PPM.    Enterobacter UTI:  Completed the course of cefdinir  Constipation:  Resolved   Cough :  Chest x-ray showed pulmonary congestion.  IV Lasix x2 ordered Repeat chest x-ray is negative for acute cardiopulm disease.   Anemia of chronic disease Hemoglobin stable around 10. No signs of bleeding.   DVT prophylaxis: coumadin .  Code Status: full code.  Family Communication: none at bedside.  Disposition Plan: pending  SNF placement, pt not safe for discharge home.    Consultants:   NONE.    Procedures: NONE.    Antimicrobials: ON CEFNIDIR.    Subjective: Some dizziness earlier today. Pt sitting int he chair and doing puzzles, resolved spontaneously.  No chest pain or sob. No nausea, vomiting  Objective: Vitals:   08/20/19 0516 08/20/19 0816 08/20/19 1152 08/20/19 1716  BP: 115/64  105/60 137/76  Pulse: 70 68 62 68  Resp: 18     Temp: 97.9 F (36.6 C)  97.8 F (36.6 C)   TempSrc: Oral  Oral   SpO2: 100%  97%   Weight: 78.3 kg     Height:       No intake or output data in the 24 hours ending 08/20/19 1820 Filed Weights   08/18/19 0444 08/19/19 0348 08/20/19 0516  Weight:  77 kg 76.9 kg 78.3 kg    Examination:  General exam: alert and comfortable. No distress.  Respiratory system: clear to ausculation bilaterally. No wheezing or rhonchi.  Cardiovascular system: S1S2, RRR, trace pedal edema.  Gastrointestinal system: ABD is soft, non tender non distended bowel sounds.  Central nervous system: alert and oriented, non focal.  Extremities: no cyanosis or clubbing.  Skin: no rashes seen.  Psychiatry: \ mood is appropriate.    Data Reviewed: I have personally reviewed following labs and imaging studies  CBC: Recent Labs  Lab 08/15/19 0318 08/16/19 0338 08/17/19 0335 08/18/19 0325 08/20/19 0957  WBC 3.6* 2.9* 3.1* 3.5* 2.7*  HGB 9.2*  8.9* 9.5* 9.3* 10.0*  HCT 29.6* 28.2* 30.0* 29.0* 31.4*  MCV 95.5 94.6 95.8 94.8 95.7  PLT 111* 114* 127* 124* 169*   Basic Metabolic Panel: Recent Labs  Lab 08/14/19 0329 08/15/19 0953 08/16/19 0338 08/18/19 0325 08/19/19 0337 08/20/19 0957  NA 140  --  138 137 142 138  K 3.9  --  4.4 5.2* 3.4* 3.8  CL 106  --  102 102 104 103  CO2 26  --  26 25 24 24   GLUCOSE 93  --  88 187* 65* 243*  BUN 36*  --  32* 38* 44* 41*  CREATININE 1.83* 1.78* 1.78* 2.19* 2.06* 1.74*  CALCIUM 8.7*  --  9.0 8.7* 8.5* 8.7*   GFR: Estimated Creatinine Clearance: 24.9 mL/min (A) (by C-G formula based on SCr of 1.74 mg/dL (H)). Liver Function Tests: No results for input(s): AST, ALT, ALKPHOS, BILITOT, PROT, ALBUMIN in the last 168 hours. No results for input(s): LIPASE, AMYLASE in the last 168 hours. No results for input(s): AMMONIA in the last 168 hours. Coagulation Profile: Recent Labs  Lab 08/16/19 0338 08/17/19 0335 08/18/19 0325 08/19/19 0337 08/20/19 0334  INR 1.3* 1.5* 1.8* 2.3* 2.8*   Cardiac Enzymes: No results for input(s): CKTOTAL, CKMB, CKMBINDEX, TROPONINI in the last 168 hours. BNP (last 3 results) No results for input(s): PROBNP in the last 8760 hours. HbA1C: No results for input(s): HGBA1C in the last 72 hours. CBG: Recent Labs  Lab 08/19/19 2109 08/20/19 0618 08/20/19 1059 08/20/19 1237 08/20/19 1657  GLUCAP 204* 95 229* 234* 162*   Lipid Profile: No results for input(s): CHOL, HDL, LDLCALC, TRIG, CHOLHDL, LDLDIRECT in the last 72 hours. Thyroid Function Tests: No results for input(s): TSH, T4TOTAL, FREET4, T3FREE, THYROIDAB in the last 72 hours. Anemia Panel: No results for input(s): VITAMINB12, FOLATE, FERRITIN, TIBC, IRON, RETICCTPCT in the last 72 hours. Sepsis Labs: No results for input(s): PROCALCITON, LATICACIDVEN in the last 168 hours.  Recent Results (from the past 240 hour(s))  SARS CORONAVIRUS 2 (TAT 6-24 HRS) Nasopharyngeal Nasopharyngeal Swab      Status: None   Collection Time: 08/10/19  6:24 PM   Specimen: Nasopharyngeal Swab  Result Value Ref Range Status   SARS Coronavirus 2 NEGATIVE NEGATIVE Final    Comment: (NOTE) SARS-CoV-2 target nucleic acids are NOT DETECTED. The SARS-CoV-2 RNA is generally detectable in upper and lower respiratory specimens during the acute phase of infection. Negative results do not preclude SARS-CoV-2 infection, do not rule out co-infections with other pathogens, and should not be used as the sole basis for treatment or other patient management decisions. Negative results must be combined with clinical observations, patient history, and epidemiological information. The expected result is Negative. Fact Sheet for Patients: SugarRoll.be Fact Sheet for Healthcare Providers: https://www.woods-mathews.com/ This test is not yet approved or  cleared by the [place] and  has been authorized for detection and/or diagnosis of SARS-CoV-2 by FDA under an Emergency Use Authorization (EUA). This EUA will remain  in effect (meaning this test can be used) for the duration of the COVID-19 declaration under Section 56 4(b)(1) of the Act, 21 U.S.C. section 360bbb-3(b)(1), unless the authorization is terminated or revoked sooner. Performed at [facility], [place] [address] [place], [place] [address]          Radiology Studies: Dg Chest [place] 1 View  Result Date: 08/19/2019 CLINICAL DATA:  85 year old female with chest pain. EXAM: PORTABLE CHEST 1 VIEW COMPARISON:  Chest radiograph dated 08/15/2019 FINDINGS: There is no focal consolidation, pleural effusion, or pneumothorax. Left lung base linear atelectasis. Mild cardiomegaly. Left pectoral pacemaker device. No acute osseous pathology. Osteopenia with degenerative changes of the spine and shoulders. IMPRESSION: 1. No acute cardiopulmonary process. 2. Mild cardiomegaly. Electronically Signed   By: Anner Crete M.D.   On: 08/19/2019 19:48        Scheduled Meds: . amiodarone  200 mg Oral Daily  . anastrozole  1 mg Oral Daily  . atorvastatin  20 mg Oral QHS  . calcium carbonate  1,250 mg Oral Q breakfast  . carvedilol  12.5 mg Oral BID WC  . diclofenac sodium  2 g Topical QHS  . ferrous sulfate  325 mg Oral Q breakfast  . influenza vaccine adjuvanted  0.5 mL Intramuscular Tomorrow-1000  . insulin aspart  0-9 Units Subcutaneous TID WC  . insulin aspart  2 Units Subcutaneous TID WC  . insulin detemir  14 Units Subcutaneous Daily  . isosorbide-hydrALAZINE  1 tablet Oral TID  . magnesium oxide  400 mg Oral Daily  . oxybutynin  5 mg Oral BID  . pantoprazole  80 mg Oral Daily  . pregabalin  75 mg Oral QHS  . sodium chloride flush  3 mL Intravenous Q12H  . Warfarin - Pharmacist Dosing Inpatient   Does not apply q1800   Continuous Infusions:    LOS: 9 days        Hosie Poisson, MD [facility] Pager [phone]  If 7PM-7AM, please contact night-coverage [email or web address] Password [email or web address] 08/20/2019, 6:20 PM

## 2019-08-21 DIAGNOSIS — I824Z1 Acute embolism and thrombosis of unspecified deep veins of right distal lower extremity: Secondary | ICD-10-CM | POA: Diagnosis not present

## 2019-08-21 DIAGNOSIS — N183 Chronic kidney disease, stage 3 unspecified: Secondary | ICD-10-CM | POA: Diagnosis not present

## 2019-08-21 DIAGNOSIS — R55 Syncope and collapse: Secondary | ICD-10-CM | POA: Diagnosis not present

## 2019-08-21 DIAGNOSIS — E1122 Type 2 diabetes mellitus with diabetic chronic kidney disease: Secondary | ICD-10-CM | POA: Diagnosis not present

## 2019-08-21 LAB — GLUCOSE, CAPILLARY
Glucose-Capillary: 165 mg/dL — ABNORMAL HIGH (ref 70–99)
Glucose-Capillary: 201 mg/dL — ABNORMAL HIGH (ref 70–99)
Glucose-Capillary: 124 mg/dL — ABNORMAL HIGH (ref 70–99)
Glucose-Capillary: 161 mg/dL — ABNORMAL HIGH (ref 70–99)

## 2019-08-21 LAB — PROTIME-INR
INR: 2.8 — ABNORMAL HIGH (ref 0.8–1.2)
Prothrombin Time: 29.2 seconds — ABNORMAL HIGH (ref 11.4–15.2)

## 2019-08-21 MED ORDER — FUROSEMIDE 10 MG/ML IJ SOLN
60.0000 mg | Freq: Once | INTRAMUSCULAR | Status: AC
  Administered 2019-08-21: 60 mg via INTRAVENOUS
  Filled 2019-08-21: qty 6

## 2019-08-21 MED ORDER — WARFARIN SODIUM 7.5 MG PO TABS
7.5000 mg | ORAL_TABLET | Freq: Once | ORAL | Status: AC
  Administered 2019-08-21: 7.5 mg via ORAL
  Filled 2019-08-21: qty 1

## 2019-08-21 MED ORDER — TORSEMIDE 20 MG PO TABS
40.0000 mg | ORAL_TABLET | Freq: Two times a day (BID) | ORAL | Status: DC
  Administered 2019-08-22 (×2): 40 mg via ORAL
  Filled 2019-08-21 (×2): qty 2

## 2019-08-21 NOTE — TOC Progression Note (Signed)
Transition of Care [facility]) - Progression Note    Patient Details  Name: [name] MRN: [id number] Date of Birth: August 28, 1933  Transition of Care [facility]) CM/SW [place], [place] Phone Number: 08/21/2019, 5:18 PM  Clinical Narrative:     CSW gave patient PACE pamphlet to give to her daughter.   Thurmond Butts, MSW, [facility] Clinical Social Worker [phone]     Expected Discharge Plan: Skilled Nursing Facility Barriers to Discharge: Continued Medical Work up, Ship broker, SNF Pending bed offer  Expected Discharge Plan and Services Expected Discharge Plan: [place] In-house Referral: Clinical Social Work     Living arrangements for the past 2 months: Apartment                                       Social Determinants of Health (SDOH) Interventions    Readmission Risk Interventions Readmission Risk Prevention Plan 01/03/2019 01/02/2019  Transportation Screening (No Data) Complete  PCP or Specialist Appt within 3-5 Days - Complete  HRI or [place] - Complete  Social Work Consult for [place] Planning/Counseling - Complete  Palliative Care Screening - Not Applicable  Medication Review Press photographer) - Complete  Some recent data might be hidden

## 2019-08-21 NOTE — Progress Notes (Signed)
ANTICOAGULATION CONSULT NOTE - Follow Up Consult  Pharmacy Consult for warfarin Indication: recurrent DVT  Allergies  Allergen Reactions  . Aspirin Itching  . Penicillins Itching and Rash    DID THE REACTION INVOLVE: Swelling of the face/tongue/throat, SOB, or low BP? Yes Sudden or severe rash/hives, skin peeling, or the inside of the mouth or nose? No Did it require medical treatment? Yes When did it last happen?"More than 10 years ago" If all above answers are "NO", may proceed with cephalosporin use.     Patient Measurements: Height: 5\' 5"  (165.1 cm) Weight: 173 lb 11.2 oz (78.8 kg) IBW/kg (Calculated) : 57   Vital Signs: Temp: 98.5 F (36.9 C) (10/28 0520) Temp Source: Oral (10/28 0520) BP: 125/64 (10/28 0520) Pulse Rate: 104 (10/28 0520)  Labs: Recent Labs    08/19/19 0337 08/20/19 0334 08/20/19 0957 08/21/19 0354  HGB  --   --  10.0*  --   HCT  --   --  31.4*  --   PLT  --   --  122*  --   LABPROT 25.3* 29.2*  --  29.2*  INR 2.3* 2.8*  --  2.8*  CREATININE 2.06*  --  1.74*  --     Estimated Creatinine Clearance: 25 mL/min (A) (by C-G formula based on SCr of 1.74 mg/dL (H)).  Assessment: 3 yoF on eliquis PTA with new DVT. Pt transitioned to warfarin with concern for poor compliance and/or treatment failure. Patient appears to be extensive metabolizer, required 10 - 12.5mg  x4 days to get to therapeutic INR. Anticipate 7.5mg  daily will keep patient at goal.   INR remains therapeutic at 2.8, enoxaparin bridge off. H/H stable. On chronic amiodarone dose, no other significant interactions. Eating well. Planned discharge to SNF soon.  Goal of Therapy:  INR = 2 to 3  Plan:  Continue 7.5mg  x1  Daily INR  Benetta Spar, PharmD, BCPS, [facility] Clinical Pharmacist  Please check AMION for all [place] phone numbers After 10:00 PM, call [place] [phone]

## 2019-08-21 NOTE — Progress Notes (Signed)
PROGRESS NOTE    [name]  JWL:[id number] DOB: 07/19/1933 DOA: 08/10/2019 PCP: [name], MD      Brief Narrative:  [name] is an 85 y.o. F with sCHF EF 40-45%, CKD III baseline 1.7, PPM, BrCa surrently on neoadj hormone therapy, DVT/PE on ELiquis who presented with syncope.   In the ER, found to have acute distal RLE DVT.      Assessment & Plan:   Syncope Suspect this was due to hypoglycemia, exacerbated by orthostasis.   CT head negative, pacer interrogated and no arrhythmias. TED hose ordered, torsemide decreased, no further syncope.    Recurrent DVT In the ER found to have a calf DVT.  Possible med noncompliance versus medication failure.  Given concern for medication failure, she was switched to warfarin with Lovenox bridge. Now therapeutic INR -Continue warfarin  Breast cancer Follows up with Dr. Lindi [name] as outpatient -Continue Arimidex  Type 2 DM Glucoses controlled -Continue Levemir -Continue mealtime and SS correction insulin -Continue atorvastatin -Continue Lyrica  Stage 3 CKD  Creatinine back to baseline at 1.7  Acute on chronic systolic and diastolic heart failure  Hypertension BP controlled  WRT fluid status, torsemide had been reduced earlier, then never restarted due to concerns re orthostasis.  Now dyspneic, congested again, feels puffy.  CXR yesterday shows no pneumonia. -Lasix IV x2 -BMP tomorrow -Resume torsemide tomorrow -Continue BiDil  Hyperkalemia Resolved  OSA BiPAP at night  H/o recurrent SVT/ LBBB S/P PPM.   Enterobacter UTI Completed the course of cefdinir  Constipation Resolved   Anemia of chronic disease Hemoglobin stable around 10. No signs of bleeding.  -Continue iron  Other medications -Continue oxybutynin, pantoprazole  Leukopenia Stable     MDM and disposition: The below labs and imaging reports were reviewed and summarized above.  Medication management as above.  The  patient was admitted with syncope and DVT.  She is persistently orthostatic and unsteady.  Will require IV Lasix today, then abdominal binder and rehab in nursing center.        DVT prophylaxis: Warfarin Code Status: FULL Family Communication: Daughter by phone    Consultants:     Procedures:   10/17 [place] LLE -- distal DVT  10/17 VQ scan -- normal  Antimicrobials:   Cefdinir  10/20-10/24   Subjective: Congested today.  Orthopneic, dyspneic.  No fever, no confusion, no dizziness.  Appetite good, no vomiting.  Objective: Vitals:   08/20/19 1940 08/21/19 0515 08/21/19 0520 08/21/19 1223  BP: (!) 105/45  125/64 122/72  Pulse: 60  (!) 104 62  Resp:   18   Temp: 98.2 F (36.8 C)  98.5 F (36.9 C) 98.3 F (36.8 C)  TempSrc: Oral  Oral Oral  SpO2: 97%  98%   Weight:  78.8 kg    Height:        Intake/Output Summary (Last 24 hours) at 08/21/2019 1639 Last data filed at 08/21/2019 0222 Gross per 24 hour  Intake -  Output 500 ml  Net -500 ml   Filed Weights   08/19/19 0348 08/20/19 0516 08/21/19 0515  Weight: 76.9 kg 78.3 kg 78.8 kg    Examination: General appearance:  adult female, alert and in no acute distress.   HEENT: Anicteric, conjunctiva pink, lids and lashes normal. No nasal deformity, discharge, epistaxis.  Lips moist.   Skin: Warm and dry.   No suspicious rashes or lesions. Cardiac: RRR, nl S1-S2, no murmurs appreciated.  Capillary refill is brisk.  JVP not  visible.  trace LE edema.  Radia  pulses 2+ and symmetric. Respiratory: Normal respiratory rate and rhythm.  CTAB without rales or wheezes.  Crackles at bases. Abdomen: Abdomen soft.  no TTP. No ascites, distension, hepatosplenomegaly.   MSK: No deformities or effusions. Neuro: Awake and alert.  EOMI, moves all extremities. Speech fluent.    Psych: Sensorium intact and responding to questions, attention normal. Affect normal.  Judgment and insight appear normal.    Data Reviewed: I have  personally reviewed following labs and imaging studies:  CBC: Recent Labs  Lab 08/15/19 0318 08/16/19 0338 08/17/19 0335 08/18/19 0325 08/20/19 0957  WBC 3.6* 2.9* 3.1* 3.5* 2.7*  HGB 9.2* 8.9* 9.5* 9.3* 10.0*  HCT 29.6* 28.2* 30.0* 29.0* 31.4*  MCV 95.5 94.6 95.8 94.8 95.7  PLT 111* 114* 127* 124* 789*   Basic Metabolic Panel: Recent Labs  Lab 08/15/19 0953 08/16/19 0338 08/18/19 0325 08/19/19 0337 08/20/19 0957  NA  --  138 137 142 138  K  --  4.4 5.2* 3.4* 3.8  CL  --  102 102 104 103  CO2  --  _0 GLUCOSE  --  88 187* 65* 243*  BUN  --  32* 38* 44* 41*  CREATININE 1.78* 1.78* 2.19* 2.06* 1.74*  CALCIUM  --  9.0 8.7* 8.5* 8.7*   GFR: Estimated Creatinine Clearance: 25 mL/min (A) (by C-G formula based on SCr of 1.74 mg/dL (H)). Liver Function Tests: No results for input(s): AST, ALT, ALKPHOS, BILITOT, PROT, ALBUMIN in the last 168 hours. No results for input(s): LIPASE, AMYLASE in the last 168 hours. No results for input(s): AMMONIA in the last 168 hours. Coagulation Profile: Recent Labs  Lab 08/17/19 0335 08/18/19 0325 08/19/19 0337 08/20/19 0334 08/21/19 0354  INR 1.5* 1.8* 2.3* 2.8* 2.8*   Cardiac Enzymes: No results for input(s): CKTOTAL, CKMB, CKMBINDEX, TROPONINI in the last 168 hours. BNP (last 3 results) No results for input(s): PROBNP in the last 8760 hours. HbA1C: No results for input(s): HGBA1C in the last 72 hours. CBG: Recent Labs  Lab 08/20/19 1237 08/20/19 1657 08/20/19 2058 08/21/19 0626 08/21/19 1146  GLUCAP 234* 162* 124* 165* 201*   Lipid Profile: No results for input(s): CHOL, HDL, LDLCALC, TRIG, CHOLHDL, LDLDIRECT in the last 72 hours. Thyroid Function Tests: No results for input(s): TSH, T4TOTAL, FREET4, T3FREE, THYROIDAB in the last 72 hours. Anemia Panel: No results for input(s): VITAMINB12, FOLATE, FERRITIN, TIBC, IRON, RETICCTPCT in the last 72 hours. Urine analysis:    Component Value Date/Time    COLORURINE YELLOW 08/10/2019 1624   APPEARANCEUR HAZY (A) 08/10/2019 1624   LABSPEC 1.008 08/10/2019 1624   PHURINE 5.0 08/10/2019 1624   GLUCOSEU NEGATIVE 08/10/2019 1624   HGBUR NEGATIVE 08/10/2019 1624   BILIRUBINUR NEGATIVE 08/10/2019 1624   BILIRUBINUR negative 05/21/2019 1448   KETONESUR NEGATIVE 08/10/2019 1624   PROTEINUR NEGATIVE 08/10/2019 1624   UROBILINOGEN 0.2 05/21/2019 1448   UROBILINOGEN 0.2 10/12/2012 2037   NITRITE NEGATIVE 08/10/2019 1624   LEUKOCYTESUR LARGE (A) 08/10/2019 1624   Sepsis Labs: _1 (procalcitonin:4,lacticacidven:4)  ) Recent Results (from the past 240 hour(s))  SARS CORONAVIRUS 2 (TAT 6-24 HRS) Nasopharyngeal Nasopharyngeal Swab     Status: None   Collection Time: 08/20/19  5:59 PM   Specimen: Nasopharyngeal Swab  Result Value Ref Range Status   SARS Coronavirus 2 NEGATIVE NEGATIVE Final    Comment: (NOTE) SARS-CoV-2 target nucleic acids are NOT DETECTED. The SARS-CoV-2 RNA is generally detectable in  upper and lower respiratory specimens during the acute phase of infection. Negative results do not preclude SARS-CoV-2 infection, do not rule out co-infections with other pathogens, and should not be used as the sole basis for treatment or other patient management decisions. Negative results must be combined with clinical observations, patient history, and epidemiological information. The expected result is Negative. Fact Sheet for Patients: SugarRoll.be Fact Sheet for Healthcare Providers: https://www.woods-mathews.com/ This test is not yet approved or cleared by the [place] FDA and  has been authorized for detection and/or diagnosis of SARS-CoV-2 by FDA under an Emergency Use Authorization (EUA). This EUA will remain  in effect (meaning this test can be used) for the duration of the COVID-19 declaration under Section 56 4(b)(1) of the Act, 21 U.S.C. section 360bbb-3(b)(1), unless the  authorization is terminated or revoked sooner. Performed at [facility], [place] [address], [place], [place] [address]          Radiology Studies: Dg Chest [place] 1 View  Result Date: 08/19/2019 CLINICAL DATA:  85 year old female with chest pain. EXAM: PORTABLE CHEST 1 VIEW COMPARISON:  Chest radiograph dated 08/15/2019 FINDINGS: There is no focal consolidation, pleural effusion, or pneumothorax. Left lung base linear atelectasis. Mild cardiomegaly. Left pectoral pacemaker device. No acute osseous pathology. Osteopenia with degenerative changes of the spine and shoulders. IMPRESSION: 1. No acute cardiopulmonary process. 2. Mild cardiomegaly. Electronically Signed   By: Anner Crete M.D.   On: 08/19/2019 19:48        Scheduled Meds: . amiodarone  200 mg Oral Daily  . anastrozole  1 mg Oral Daily  . atorvastatin  20 mg Oral QHS  . calcium carbonate  1,250 mg Oral Q breakfast  . carvedilol  12.5 mg Oral BID WC  . diclofenac sodium  2 g Topical QHS  . ferrous sulfate  325 mg Oral Q breakfast  . influenza vaccine adjuvanted  0.5 mL Intramuscular Tomorrow-1000  . insulin aspart  0-9 Units Subcutaneous TID WC  . insulin aspart  2 Units Subcutaneous TID WC  . insulin detemir  18 Units Subcutaneous Daily  . isosorbide-hydrALAZINE  1 tablet Oral TID  . magnesium oxide  400 mg Oral Daily  . oxybutynin  5 mg Oral BID  . pantoprazole  80 mg Oral Daily  . pregabalin  75 mg Oral QHS  . sodium chloride flush  3 mL Intravenous Q12H  . [START ON 08/22/2019] torsemide  40 mg Oral BID  . warfarin  7.5 mg Oral ONCE-1800  . Warfarin - Pharmacist Dosing Inpatient   Does not apply q1800   Continuous Infusions:   LOS: 10 days    Time spent: 25 minutes    Edwin Dada, MD [facility] 08/21/2019, 4:39 PM     Please page through Impact:  [email or web address] Password [email or web address] If 7PM-7AM, please contact night-coverage

## 2019-08-21 NOTE — Progress Notes (Signed)
Physical Therapy Treatment Patient Details Name: [name] MRN: [id number] DOB: May 31, 1933 Today's Date: 08/21/2019    History of Present Illness 85 y.o. female with medical history significant of DVT and PE on eliquis, breast cancer undergoing neoadjuvant hormone therapy, chronic CHF with EF 40-45%, CKD stage 3, sinus pauses s/p PPM. She presented to the ED after a syncopal episode at home. R ankle fx diagnosed 07/09/19. Doppler revealed acute DVT R distal LE.    PT Comments    Pt tolerated treatment well. Pt with improved ambulation tolerance, however reporting some minimal discomfort in R ankle and knee during mobility, PT ceases ambulation at this time as MD recommendations to limit ambulation from 07/09/19. Pt tolerates LE therapeutic exercise well with goof form after PT instruction. Pt expresses a strong desire to return to her baseline and will benefit from PT POC to do so.   Follow Up Recommendations  SNF     Equipment Recommendations  None recommended by PT    Recommendations for Other Services       Precautions / Restrictions Precautions Precautions: Fall Precaution Comments: L supraspinatus tear also noted from orthopedic note on 07/09/19 Required Braces or Orthoses: Other Brace Other Brace: air cast RLE for transfers, limit ambulation Restrictions Weight Bearing Restrictions: Yes RLE Weight Bearing: Weight bearing as tolerated Other Position/Activity Restrictions: per Dr. Lorin Mercy 07/09/19 WBAT in air cast RLE and limit ambulation    Mobility  Bed Mobility Overal bed mobility: (pt sitting in recliner upon arrival)                Transfers Overall transfer level: Needs assistance Equipment used: Rolling walker (2 wheeled) Transfers: Sit to/from Stand Sit to Stand: Min guard            Ambulation/Gait Ambulation/Gait assistance: Min guard Gait Distance (Feet): 35 Feet Assistive device: Rolling walker (2 wheeled) Gait Pattern/deviations: Step-to  pattern;Decreased step length - right;Decreased step length - left;Wide base of support Gait velocity: reduced Gait velocity interpretation: <1.31 ft/sec, indicative of household ambulator General Gait Details: pt with short step to gait, increased trunk flexion   Stairs             Wheelchair Mobility    Modified Rankin (Stroke Patients Only)       Balance Overall balance assessment: Needs assistance Sitting-balance support: No upper extremity supported;Feet supported Sitting balance-Leahy Scale: Fair Sitting balance - Comments: supervision   Standing balance support: Bilateral upper extremity supported Standing balance-Leahy Scale: Fair Standing balance comment: minG to close supervision with BUE support of RW                            Cognition Arousal/Alertness: Awake/alert Behavior During Therapy: WFL for tasks assessed/performed Overall Cognitive Status: Within Functional Limits for tasks assessed                                        Exercises General Exercises - Lower Extremity Ankle Circles/Pumps: AROM;Both;10 reps Gluteal Sets: AROM;Both;10 reps Long Arc Quad: AROM;Both;10 reps Hip Flexion/Marching: AROM;Both;10 reps    General Comments General comments (skin integrity, edema, etc.): pt requiring education on management of fracture and need for limited ambulation only until MD documents otherwise      Pertinent Vitals/Pain Pain Assessment: 0-10 Pain Score: 2  Pain Location: R ankle Pain Descriptors / Indicators: Aching Pain Intervention(s):  Limited activity within patient's tolerance    Home Living                      Prior Function            PT Goals (current goals can now be found in the care plan section) Acute Rehab PT Goals Patient Stated Goal: to stand up with less difficulty Progress towards PT goals: Progressing toward goals    Frequency    Min 3X/week      PT Plan Current plan  remains appropriate    Co-evaluation              AM-PAC PT "6 Clicks" Mobility   Outcome Measure  Help needed turning from your back to your side while in a flat bed without using bedrails?: A Little Help needed moving from lying on your back to sitting on the side of a flat bed without using bedrails?: A Little Help needed moving to and from a bed to a chair (including a wheelchair)?: A Little Help needed standing up from a chair using your arms (e.g., wheelchair or bedside chair)?: A Little Help needed to walk in hospital room?: A Little Help needed climbing 3-5 steps with a railing? : A Lot 6 Click Score: 17    End of Session Equipment Utilized During Treatment: Gait belt Activity Tolerance: Patient tolerated treatment well Patient left: in chair;with call bell/phone within reach Nurse Communication: Mobility status PT Visit Diagnosis: Other abnormalities of gait and mobility (R26.89);History of falling (Z91.81);Muscle weakness (generalized) (M62.81)     Time: 1017-5102 PT Time Calculation (min) (ACUTE ONLY): 28 min  Charges:  $Gait Training: 8-22 mins $Therapeutic Exercise: 8-22 mins                     Zenaida Niece, PT, DPT Acute Rehabilitation Pager: [phone]   Zenaida Niece 08/21/2019, 11:43 AM

## 2019-08-21 NOTE — TOC Progression Note (Addendum)
Transition of Care [facility]) - Progression Note    Patient Details  Name: [name] MRN: [id number] Date of Birth: December 10, 1932  Transition of Care [facility]) CM/SW [place], [place] Phone Number: 08/21/2019, 3:29 PM  Clinical Narrative:     Patient's insurance remains pending for ST rehab at SNF.. CSW was informed by patient's insurance they had approved in error inpatient rehab at [facility], it will be correct and they will contact CSW with new authorization.  Patient will discharge to [facility] once insurance approval is received.  Thurmond Butts, MSW, [facility] Clinical Social Worker [phone]   Expected Discharge Plan: Skilled Nursing Facility Barriers to Discharge: Continued Medical Work up, Ship broker, SNF Pending bed offer  Expected Discharge Plan and Services Expected Discharge Plan: [place] In-house Referral: Clinical Social Work     Living arrangements for the past 2 months: Apartment                                       Social Determinants of Health (SDOH) Interventions    Readmission Risk Interventions Readmission Risk Prevention Plan 01/03/2019 01/02/2019  Transportation Screening (No Data) Complete  PCP or Specialist Appt within 3-5 Days - Complete  HRI or [place] - Complete  Social Work Consult for [place] Planning/Counseling - Complete  Palliative Care Screening - Not Applicable  Medication Review Press photographer) - Complete  Some recent data might be hidden

## 2019-08-22 ENCOUNTER — Ambulatory Visit: Payer: Self-pay

## 2019-08-22 DIAGNOSIS — I10 Essential (primary) hypertension: Secondary | ICD-10-CM | POA: Diagnosis not present

## 2019-08-22 DIAGNOSIS — I82401 Acute embolism and thrombosis of unspecified deep veins of right lower extremity: Secondary | ICD-10-CM | POA: Diagnosis not present

## 2019-08-22 DIAGNOSIS — N184 Chronic kidney disease, stage 4 (severe): Secondary | ICD-10-CM | POA: Diagnosis not present

## 2019-08-22 DIAGNOSIS — R1312 Dysphagia, oropharyngeal phase: Secondary | ICD-10-CM | POA: Diagnosis not present

## 2019-08-22 DIAGNOSIS — U071 COVID-19: Secondary | ICD-10-CM | POA: Diagnosis not present

## 2019-08-22 DIAGNOSIS — I471 Supraventricular tachycardia: Secondary | ICD-10-CM | POA: Diagnosis not present

## 2019-08-22 DIAGNOSIS — N183 Chronic kidney disease, stage 3 unspecified: Secondary | ICD-10-CM | POA: Diagnosis not present

## 2019-08-22 DIAGNOSIS — Z743 Need for continuous supervision: Secondary | ICD-10-CM | POA: Diagnosis not present

## 2019-08-22 DIAGNOSIS — R279 Unspecified lack of coordination: Secondary | ICD-10-CM | POA: Diagnosis not present

## 2019-08-22 DIAGNOSIS — M6281 Muscle weakness (generalized): Secondary | ICD-10-CM | POA: Diagnosis not present

## 2019-08-22 DIAGNOSIS — I951 Orthostatic hypotension: Secondary | ICD-10-CM | POA: Diagnosis not present

## 2019-08-22 DIAGNOSIS — I82491 Acute embolism and thrombosis of other specified deep vein of right lower extremity: Secondary | ICD-10-CM | POA: Diagnosis not present

## 2019-08-22 DIAGNOSIS — R921 Mammographic calcification found on diagnostic imaging of breast: Secondary | ICD-10-CM | POA: Diagnosis not present

## 2019-08-22 DIAGNOSIS — E1122 Type 2 diabetes mellitus with diabetic chronic kidney disease: Secondary | ICD-10-CM | POA: Diagnosis not present

## 2019-08-22 DIAGNOSIS — C50911 Malignant neoplasm of unspecified site of right female breast: Secondary | ICD-10-CM | POA: Diagnosis not present

## 2019-08-22 DIAGNOSIS — N6489 Other specified disorders of breast: Secondary | ICD-10-CM | POA: Diagnosis not present

## 2019-08-22 DIAGNOSIS — R5381 Other malaise: Secondary | ICD-10-CM | POA: Diagnosis not present

## 2019-08-22 DIAGNOSIS — D61818 Other pancytopenia: Secondary | ICD-10-CM | POA: Diagnosis not present

## 2019-08-22 DIAGNOSIS — R262 Difficulty in walking, not elsewhere classified: Secondary | ICD-10-CM | POA: Diagnosis not present

## 2019-08-22 DIAGNOSIS — R269 Unspecified abnormalities of gait and mobility: Secondary | ICD-10-CM | POA: Diagnosis not present

## 2019-08-22 DIAGNOSIS — I5032 Chronic diastolic (congestive) heart failure: Secondary | ICD-10-CM

## 2019-08-22 DIAGNOSIS — E11649 Type 2 diabetes mellitus with hypoglycemia without coma: Secondary | ICD-10-CM | POA: Diagnosis not present

## 2019-08-22 DIAGNOSIS — E0822 Diabetes mellitus due to underlying condition with diabetic chronic kidney disease: Secondary | ICD-10-CM | POA: Diagnosis not present

## 2019-08-22 DIAGNOSIS — I824Z1 Acute embolism and thrombosis of unspecified deep veins of right distal lower extremity: Secondary | ICD-10-CM | POA: Diagnosis not present

## 2019-08-22 DIAGNOSIS — Z794 Long term (current) use of insulin: Secondary | ICD-10-CM | POA: Diagnosis not present

## 2019-08-22 LAB — CBC
HCT: 27.8 % — ABNORMAL LOW (ref 36.0–46.0)
Hemoglobin: 8.9 g/dL — ABNORMAL LOW (ref 12.0–15.0)
MCH: 30.5 pg (ref 26.0–34.0)
MCHC: 32 g/dL (ref 30.0–36.0)
MCV: 95.2 fL (ref 80.0–100.0)
Platelets: 112 10*3/uL — ABNORMAL LOW (ref 150–400)
RBC: 2.92 MIL/uL — ABNORMAL LOW (ref 3.87–5.11)
RDW: 15 % (ref 11.5–15.5)
WBC: 2.6 10*3/uL — ABNORMAL LOW (ref 4.0–10.5)
nRBC: 0 % (ref 0.0–0.2)

## 2019-08-22 LAB — BASIC METABOLIC PANEL
Anion gap: 8 (ref 5–15)
BUN: 40 mg/dL — ABNORMAL HIGH (ref 8–23)
CO2: 24 mmol/L (ref 22–32)
Calcium: 8.1 mg/dL — ABNORMAL LOW (ref 8.9–10.3)
Chloride: 106 mmol/L (ref 98–111)
Creatinine, Ser: 1.69 mg/dL — ABNORMAL HIGH (ref 0.44–1.00)
GFR calc Af Amer: 32 mL/min — ABNORMAL LOW (ref >60)
GFR calc non Af Amer: 27 mL/min — ABNORMAL LOW (ref >60)
Glucose, Bld: 70 mg/dL (ref 70–99)
Potassium: 3.5 mmol/L (ref 3.5–5.1)
Sodium: 138 mmol/L (ref 135–145)

## 2019-08-22 LAB — PROTIME-INR
INR: 3.1 — ABNORMAL HIGH (ref 0.8–1.2)
Prothrombin Time: 31.8 seconds — ABNORMAL HIGH (ref 11.4–15.2)

## 2019-08-22 LAB — GLUCOSE, CAPILLARY
Glucose-Capillary: 58 mg/dL — ABNORMAL LOW (ref 70–99)
Glucose-Capillary: 175 mg/dL — ABNORMAL HIGH (ref 70–99)
Glucose-Capillary: 156 mg/dL — ABNORMAL HIGH (ref 70–99)

## 2019-08-22 MED ORDER — TRAMADOL HCL 50 MG PO TABS: 50.0000 mg | ORAL_TABLET | Freq: Two times a day (BID) | ORAL | 0 refills | Status: DC | PRN

## 2019-08-22 MED ORDER — INSULIN DETEMIR 100 UNIT/ML ~~LOC~~ SOLN: 18.0000 [IU] | Freq: Every day | SUBCUTANEOUS | 11 refills | Status: DC

## 2019-08-22 MED ORDER — WARFARIN SODIUM 5 MG PO TABS: 5.0000 mg | ORAL_TABLET | Freq: Every day | ORAL | Status: DC

## 2019-08-22 MED ORDER — WARFARIN SODIUM 5 MG PO TABS
5.0000 mg | ORAL_TABLET | Freq: Once | ORAL | Status: AC
  Administered 2019-08-22: 5 mg via ORAL
  Filled 2019-08-22: qty 1

## 2019-08-22 MED ORDER — SENNOSIDES-DOCUSATE SODIUM 8.6-50 MG PO TABS: 2.0000 | ORAL_TABLET | Freq: Every evening | ORAL | Status: DC | PRN

## 2019-08-22 MED ORDER — TORSEMIDE 20 MG PO TABS: 20.0000 mg | ORAL_TABLET | Freq: Two times a day (BID) | ORAL | 3 refills | Status: DC

## 2019-08-22 NOTE — Progress Notes (Signed)
ANTICOAGULATION CONSULT NOTE - Follow Up Consult  Pharmacy Consult for Warfarin Indication: DVT  Allergies  Allergen Reactions  . Aspirin Itching  . Penicillins Itching and Rash    DID THE REACTION INVOLVE: Swelling of the face/tongue/throat, SOB, or low BP? Yes Sudden or severe rash/hives, skin peeling, or the inside of the mouth or nose? No Did it require medical treatment? Yes When did it last happen?"More than 10 years ago" If all above answers are "NO", may proceed with cephalosporin use.     Patient Measurements: Height: 5\' 5"  (165.1 cm) Weight: 179 lb 0.2 oz (81.2 kg) IBW/kg (Calculated) : 57  Vital Signs: Temp: 98.4 F (36.9 C) (10/29 0604) Temp Source: Oral (10/29 0604) BP: 105/60 (10/29 0604) Pulse Rate: 66 (10/29 0604)  Labs: Recent Labs    08/20/19 0334 08/20/19 0957 08/21/19 0354 08/22/19 0317  HGB  --  10.0*  --  8.9*  HCT  --  31.4*  --  27.8*  PLT  --  122*  --  112*  LABPROT 29.2*  --  29.2* 31.8*  INR 2.8*  --  2.8* 3.1*  CREATININE  --  1.74*  --  1.69*    Estimated Creatinine Clearance: 26.1 mL/min (A) (by C-G formula based on SCr of 1.69 mg/dL (H)).  Assessment:  82 yoF on Eliquis PTA with new DVT. Pt transitioned to warfarin with concern for poor compliance and/or treatment failure.     INR slightly supratherapeutic today at 3.1.  Has had Warfarin 7.5 mg the last 3 days. On amiodarone as PTA.  Goal of Therapy:  INR 2-3 Monitor platelets by anticoagulation protocol: Yes   Plan:   Decrease Warfarin to 5 mg x 1 tonight.  Daily PT/INR.  Arty Baumgartner, [place] Pager: [phone] or phone: [phone] 08/22/2019,11:55 AM

## 2019-08-22 NOTE — Consult Note (Signed)
   [facility] CM Inpatient Consult   08/22/2019  TAISIA FANTINI [birth year] [id number]    Update Note:  Following disposition of this [employer] Medicare patient with 39% extreme high risk score for readmission and hospitalization.  Transition of care SW note, indicates that patient will discharge to skilled nursing facility (SNF) at [facility] for short-term rehab.  Embedded CM staff notified of patient's disposition.   For questions and additional information, please call:  Kenni Newton A. Hailyn Zarr, BSN, RN-BC [facility] Liaison Cell: [phone]

## 2019-08-22 NOTE — TOC Transition Note (Signed)
Transition of Care [facility]) - CM/SW Discharge Note   Patient Details  Name: [name] MRN: [id number] Date of Birth: August 21, 1933  Transition of Care [facility]) CM/SW Contact:  Vinie Sill, [place] Phone Number: 08/22/2019, 2:34 PM   Clinical Narrative:     Patient will DC to: [place] Date: 08/21/21 Family Notified: [name], daughter Transport By: Corey Harold  RN will arrange transportation when patient is ready for transport.  RN, patient, and facility notified of DC. Discharge Summary sent to facility. RN given number for report [phone], Room 606-P.  Clinical Social Worker signing off. Thurmond Butts, MSW, [facility] Clinical Social Worker [phone]     Final next level of care: Skilled Nursing Facility Barriers to Discharge: Barriers Resolved   Patient Goals and CMS Choice        Discharge Placement PASRR number recieved: 08/20/19            Patient chooses bed at: [facility] Patient to be transferred to facility by: [place] Name of family member notified: Small,[name], daughter Patient and family notified of of transfer: 08/22/19  Discharge Plan and Services In-house Referral: Clinical Social Work                                   Social Determinants of Health ([place]) Interventions     Readmission Risk Interventions Readmission Risk Prevention Plan 01/03/2019 01/02/2019  Transportation Screening (No Data) Complete  PCP or Specialist Appt within 3-5 Days - Complete  HRI or [place] - Complete  Social Work Consult for [place] Planning/Counseling - Complete  Palliative Care Screening - Not Applicable  Medication Review Press photographer) - Complete  Some recent data might be hidden

## 2019-08-22 NOTE — Discharge Summary (Signed)
Physician Discharge Summary  [name] DTO:[id number] DOB: [birth year] DOA: 08/10/2019  PCP: [name], MD  Admit date: 08/10/2019 Discharge date: 08/22/2019  Admitted From: Home  Disposition:  SNF [facility]   Recommendations for Outpatient Follow-up:  1. [facility]: Current total weekly dose of warfarin is 35 mg per week --> Please check INR on Friday Oct 30, adjust warfarin per protocol and then repeat INR every 3 days until therapeutic x2 2. [facility]: Please place TED hose on left leg 3. [facility]: Please check orthostatic BP on Monday Nov 2 --> If she becomes orthostatic at that time, please add abdominal binder to TED hose 4. [facility]: Please obtain BMP in 1 week 5. Follow up with [name] 1 week after discharge from [facility] 6. [name] and Dr. Lindi Adie: Daughters have asked for review by PCP and hematology of risks/benefits of warfarin vs Xarelto, to consider transition back to [place]: N/A  Equipment/Devices: TBD at SNF  Discharge Condition: Fair  CODE STATUS: FULL Diet recommendation: Diabetic, low sodium  Brief/Interim Summary: [name] is an 85 y.o. F with sCHF EF 40-45%, CKD III baseline 1.7, PPM, BrCa surrently on neoadj hormone therapy, DVT/PE on ELiquis who presented with syncope.   In the ER, found to have acute distal RLE DVT.        PRINCIPAL HOSPITAL DIAGNOSIS: Orthostatic syncope    Discharge Diagnoses:   Syncope Suspect this was due to hypoglycemia, exacerbated by orthostasis.   CT head negative, pacer interrogated and no arrhythmias.  TED hose ordered, torsemide decreased, insulin reduced.  The patient had one other episode of orthostasis, and occasional symptoms with standing, but no further syncope and was not orthostatic on the day of discharge, while wearing TED hose on left leg and after having restarted torsemide. -Repeat orthostatics in 4 days and start abdominal binder if needed -Oxybutynin  would be a reasonable medicine to trial off    Recurrent DVT In the ER had right leg pain (had had a previous fracture of the right ankle).  Found to have a calf DVT.    It was thought that this was from med noncompliance versus medication failure.  Given concern for medication failure, she was switched to warfarin with Lovenox bridge.  Now therapeutic INR -Continue warfarin 35 mg/weekly  -Close monitoring INR at SNF  I have equipoise about whether this distal DVT represents treatment failure of Eliquis or not.  Patient deferred to daughter's judgment, and daughter requested to continue warfarin for now, and discuss with Drs. Baird Cancer and Lindi Adie.  I would find it reasonable to transition back to Eliquis or perhaps Xarelto.   Breast cancer Follows up with Dr. Lindi Adie as outpatient -Continue Arimidex  Type 2 DM Glucoses controlled -Continue Levemir, dose reduced  Stage 3 CKD  Creatinine back to baseline at 1.7  Acute on chronic systolic and diastolic heart failure  Hypertension BP controlled  Hyperkalemia Resolved  OSA BiPAP at night  H/o recurrent SVT/ LBBB S/P PPM.   Enterobacter UTI Completed the course of cefdinir  Constipation Resolved   Anemia of chronic disease Hemoglobin stable around 10. No signs of bleeding.  -Continue iron  Leukopenia Stable          Discharge Instructions  Discharge Instructions    Discharge instructions   Complete by: As directed    Call [name] for a follow up appointment in 1 week after discharge from the hospital.  In  the hospital, we have changed some of your medicines: STOP apixaban START warfarin  The doctors at the nursing center will fill your new warfarin script when you leave, and they can help you arrange a clinic to have your INR checked  Discuss whether to stay on warfarin with [name]  We have reduced your insulin a little. Confirm your insulin with the doctors at rehab before you  leave  Wear the compression hose when you go home.   Increase activity slowly   Complete by: As directed      Allergies as of 08/22/2019      Reactions   Aspirin Itching   Penicillins Itching, Rash   DID THE REACTION INVOLVE: Swelling of the face/tongue/throat, SOB, or low BP? Yes Sudden or severe rash/hives, skin peeling, or the inside of the mouth or nose? No Did it require medical treatment? Yes When did it last happen?"More than 10 years ago" If all above answers are "NO", may proceed with cephalosporin use.      Medication List    STOP taking these medications   apixaban 2.5 MG Tabs tablet Commonly known as: Eliquis   Levemir FlexTouch 100 UNIT/ML Pen Generic drug: Insulin Detemir Replaced by: insulin detemir 100 UNIT/ML injection     TAKE these medications   acetaminophen 500 MG tablet Commonly known as: TYLENOL Take 1,000 mg by mouth every 6 (six) hours as needed for mild pain, moderate pain or headache.   amiodarone 200 MG tablet Commonly known as: PACERONE Take 1 tablet (200 mg total) by mouth daily.   anastrozole 1 MG tablet Commonly known as: ARIMIDEX Take 1 tablet (1 mg total) by mouth daily.   atorvastatin 20 MG tablet Commonly known as: LIPITOR Take 1 tablet (20 mg total) by mouth at bedtime.   B-12 PO Take 1 tablet by mouth daily after breakfast.   BD Pen Needle Nano U/F 32G X 4 MM Misc Generic drug: Insulin Pen Needle Use as directed   BiDil 20-37.5 MG tablet Generic drug: isosorbide-hydrALAZINE Take 1 tablet by mouth 3 (three) times daily.   calcium carbonate 600 MG Tabs tablet Commonly known as: OS-CAL Take 600 mg by mouth daily with breakfast.   carvedilol 12.5 MG tablet Commonly known as: COREG Take 12.5 mg by mouth 2 (two) times daily with a meal.   diclofenac sodium 1 % Gel Commonly known as: Voltaren Apply 2 g topically 4 (four) times daily. What changed:   when to take this  additional instructions   ferrous sulfate  325 (65 FE) MG tablet Take 325 mg by mouth daily with breakfast.   glucose blood test strip Commonly known as: Accu-Chek Guide Use as instructed to check blood sugars 3 times per day prn dx: e11.22   HumaLOG KwikPen 200 UNIT/ML Sopn Generic drug: Insulin Lispro Inject 4-8 Units into the skin 3 (three) times daily. Per sliding scale What changed:   when to take this  additional instructions   insulin detemir 100 UNIT/ML injection Commonly known as: LEVEMIR Inject 0.18 mLs (18 Units total) into the skin daily. Start taking on: August 23, 2019 Replaces: Levemir FlexTouch 100 UNIT/ML Pen   magnesium oxide 400 MG tablet Commonly known as: MAG-OX Take 400 mg by mouth daily.   omeprazole 40 MG capsule Commonly known as: PRILOSEC TAKE ONE CAPSULE BY MOUTH DAILY BEFORE A MEAL What changed:   how much to take  how to take this  when to take this  additional instructions  oxybutynin 5 MG tablet Commonly known as: DITROPAN Take 1 tablet (5 mg total) by mouth 2 (two) times daily.   potassium chloride SA 20 MEQ tablet Commonly known as: KLOR-CON Take 2 tablets (40 mEq total) by mouth daily.   pregabalin 75 MG capsule Commonly known as: LYRICA Take 1 capsule (75 mg total) by mouth at bedtime.   senna-docusate 8.6-50 MG tablet Commonly known as: Senokot-S Take 2 tablets by mouth at bedtime as needed for mild constipation.   torsemide 20 MG tablet Commonly known as: DEMADEX Take 1 tablet (20 mg total) by mouth 2 (two) times daily. What changed:   how much to take  how to take this  when to take this  additional instructions   traMADol 50 MG tablet Commonly known as: ULTRAM Take 1 tablet (50 mg total) by mouth 2 (two) times daily as needed. What changed: reasons to take this   VITAMIN D-3 PO Take 1 capsule by mouth daily after breakfast.   warfarin 5 MG tablet Commonly known as: COUMADIN Take 1 tablet (5 mg total) by mouth daily at 6 PM.       Follow-up Information    [name], MD Follow up.   Specialty: Internal Medicine Contact information: [address] [place] [address] [phone]        Jettie Booze, MD .   Specialties: Cardiology, Radiology, Interventional Cardiology Contact information: [address] [place] [address] [phone]          Allergies  Allergen Reactions  . Aspirin Itching  . Penicillins Itching and Rash    DID THE REACTION INVOLVE: Swelling of the face/tongue/throat, SOB, or low BP? Yes Sudden or severe rash/hives, skin peeling, or the inside of the mouth or nose? No Did it require medical treatment? Yes When did it last happen?"More than 10 years ago" If all above answers are "NO", may proceed with cephalosporin use.     Consultations:  None   Procedures/Studies: Dg Chest 2 View  Result Date: 08/15/2019 CLINICAL DATA:  Weakness, falling, shortness of breath, diabetes mellitus, hypertension, CHF EXAM: CHEST - 2 VIEW COMPARISON:  08/10/2019 FINDINGS: LEFT subclavian sequential transvenous pacemaker leads project at RIGHT atrium and RIGHT ventricle. Enlargement of cardiac silhouette with pulmonary vascular congestion. Mediastinal contours normal. Slightly decreased lung volumes with bibasilar atelectasis. No acute infiltrate, pleural effusion, or pneumothorax. Bones demineralized with probable BILATERAL chronic rotator cuff tears. IMPRESSION: Enlargement of cardiac silhouette with pulmonary vascular congestion and pacemaker. Bibasilar atelectasis. Electronically Signed   By: Lavonia Dana M.D.   On: 08/15/2019 12:47   Dg Lumbar Spine Complete  Result Date: 08/10/2019 CLINICAL DATA:  85 year old female with fall and back pain. EXAM: LUMBAR SPINE - COMPLETE 4+ VIEW; PELVIS - 1-2 VIEW COMPARISON:  None. FINDINGS: There is no acute fracture or subluxation of the lumbar spine. There is osteopenia with multilevel degenerative changes.  Grade 1 L4-L5 anterolisthesis. Multilevel disc desiccation and vacuum phenomena. No definite acute fracture of the pelvis identified. There is arthritic changes of the hips. The soft tissues are grossly unremarkable. Punctate radiopaque focus over the right renal silhouette may represent a tiny calculus. IMPRESSION: No acute/traumatic lumbar spine or pelvic pathology identified. Electronically Signed   By: Anner Crete M.D.   On: 08/10/2019 17:19   Dg Pelvis 1-2 Views  Result Date: 08/10/2019 CLINICAL DATA:  85 year old female with fall and back pain. EXAM: LUMBAR SPINE - COMPLETE 4+ VIEW; PELVIS - 1-2 VIEW COMPARISON:  None. FINDINGS: There is no acute fracture or subluxation of the lumbar spine. There is osteopenia with multilevel degenerative changes. Grade 1 L4-L5 anterolisthesis. Multilevel disc desiccation and vacuum phenomena. No definite acute fracture of the pelvis identified. There is arthritic changes of the hips. The soft tissues are grossly unremarkable. Punctate radiopaque focus over the right renal silhouette may represent a tiny calculus. IMPRESSION: No acute/traumatic lumbar spine or pelvic pathology identified. Electronically Signed   By: Anner Crete M.D.   On: 08/10/2019 17:19   Ct Head Wo Contrast  Result Date: 08/10/2019 CLINICAL DATA:  Syncope, head trauma EXAM: CT HEAD WITHOUT CONTRAST TECHNIQUE: Contiguous axial images were obtained from the base of the skull through the vertex without intravenous contrast. COMPARISON:  07/13/2017, 11/06/2018 FINDINGS: Brain: No evidence of acute infarction, hemorrhage, hydrocephalus, extra-axial collection or mass lesion/mass effect. Mild cerebral volume loss, age-appropriate. Vascular: Mild atherosclerotic calcifications involving the large vessels of the skull base. No unexpected hyperdense vessel. Skull: Normal. Negative for fracture or focal lesion. Sinuses/Orbits: Scattered bilateral ethmoid air cell opacification. Remaining paranasal  sinuses and mastoid air cells are clear. Orbital structures unremarkable. Other: None. IMPRESSION: 1. No acute intracranial findings. 2. Mild ethmoid sinus disease. Electronically Signed   By: Davina Poke M.D.   On: 08/10/2019 16:54   Ct Cervical Spine Wo Contrast  Result Date: 08/10/2019 CLINICAL DATA:  Fall, syncope EXAM: CT CERVICAL SPINE WITHOUT CONTRAST TECHNIQUE: Multidetector CT imaging of the cervical spine was performed without intravenous contrast. Multiplanar CT image reconstructions were also generated. COMPARISON:  X-ray 06/08/2019 FINDINGS: Alignment: Grade 1 anterolisthesis C5 on C6 and T2 on T3. Skull base and vertebrae: No acute fracture. No suspicious bone lesion. Soft tissues and spinal canal: No prevertebral fluid or swelling. No visible canal hematoma. Disc levels: Severe degenerative changes at the C5-6 disc space and right C5-6 facet joint as well as the left C6-7 facet joint with prominent subchondral cysts versus erosions. There appears to be a moderate-sized disc protrusion at C3-4 likely resulting in at least mild canal stenosis. Upper chest: Enlarged, heterogeneous thyroid gland. Other: None. IMPRESSION: 1. No acute cervical spine fracture. 2. Severe degenerative changes at C5-6 and C6-7 with probable erosions involving the left C5-6 facet joint and right C6-7 facet joint concerning for an underlying inflammatory or crystalline arthropathy. 3. Moderate-sized disc protrusion at C3-4 likely resulting in at least mild canal stenosis. 4. Enlarged, heterogeneous thyroid gland. Electronically Signed   By: Davina Poke M.D.   On: 08/10/2019 17:06   Nm Pulmonary Vent And Perf (v/q Scan)  Result Date: 08/10/2019 CLINICAL DATA:  Lower extremity DVT EXAM: NUCLEAR MEDICINE VENTILATION - PERFUSION LUNG SCAN TECHNIQUE: Ventilation images were obtained in multiple projections using inhaled aerosol Tc-35mDTPA. Perfusion images were obtained in multiple projections after intravenous  injection of Tc-990mAA. RADIOPHARMACEUTICALS:  38.7 mCi of Tc-9978mPA aerosol inhalation and 1.65 mCi Tc99m14m IV COMPARISON:  None. FINDINGS: Ventilation: No focal ventilation defect. Perfusion: No wedge shaped peripheral perfusion defects to suggest acute pulmonary embolism. IMPRESSION: Normal ventilation perfusion scan. Electronically Signed   By: BindPrudencio Pair.   On: 08/10/2019 21:36   Dg Chest Port 1 View  Result Date: 08/19/2019 CLINICAL DATA:  86 y26r old female with chest pain. EXAM: PORTABLE CHEST 1 VIEW COMPARISON:  Chest radiograph dated 08/15/2019 FINDINGS: There is no focal consolidation, pleural effusion, or pneumothorax. Left lung base linear atelectasis. Mild cardiomegaly. Left pectoral pacemaker device. No acute osseous pathology. Osteopenia with degenerative changes of the spine  and shoulders. IMPRESSION: 1. No acute cardiopulmonary process. 2. Mild cardiomegaly. Electronically Signed   By: Anner Crete M.D.   On: 08/19/2019 19:48   Dg Chest Portable 1 View  Result Date: 08/10/2019 CLINICAL DATA:  85 year old female with chest pain, blackouts. EXAM: PORTABLE CHEST 1 VIEW COMPARISON:  Portable chest 06/09/2019 and earlier. FINDINGS: Portable AP semi upright view at 1507 hours. Improved lung volumes and ventilation. Allowing for portable technique the lungs are clear. Stable left chest cardiac pacemaker. Stable mild cardiomegaly. Other mediastinal contours are within normal limits. Paucity of bowel gas in the upper abdomen. No acute osseous abnormality identified. IMPRESSION: No acute cardiopulmonary abnormality. Electronically Signed   By: Genevie Ann M.D.   On: 08/10/2019 15:25   Vas [place] Lower Extremity Venous (dvt) (only Mc & Wl 7a-7p)  Result Date: 08/11/2019  Lower Venous Study Indications: Pain, Swelling, and broken right ankle.  Comparison Study: Prior study from 2013 is available for comparison Performing Technologist: Sharion Dove RVS  Examination Guidelines: A complete  evaluation includes B-mode imaging, spectral Doppler, color Doppler, and power Doppler as needed of all accessible portions of each vessel. Bilateral testing is considered an integral part of a complete examination. Limited examinations for reoccurring indications may be performed as noted.  +---------+---------------+---------+-----------+----------+--------------+ RIGHT    CompressibilityPhasicitySpontaneityPropertiesThrombus Aging +---------+---------------+---------+-----------+----------+--------------+ CFV      Full           Yes      Yes                                 +---------+---------------+---------+-----------+----------+--------------+ SFJ      Full                                                        +---------+---------------+---------+-----------+----------+--------------+ FV Prox  Full                                                        +---------+---------------+---------+-----------+----------+--------------+ FV Mid   Full                                                        +---------+---------------+---------+-----------+----------+--------------+ FV DistalFull                                                        +---------+---------------+---------+-----------+----------+--------------+ PFV      Full                                                        +---------+---------------+---------+-----------+----------+--------------+ POP      Full  Yes      Yes                                 +---------+---------------+---------+-----------+----------+--------------+ PTV      None                                         Acute          +---------+---------------+---------+-----------+----------+--------------+ PERO     None                                         Acute          +---------+---------------+---------+-----------+----------+--------------+    +----+---------------+---------+-----------+----------+--------------+ LEFTCompressibilityPhasicitySpontaneityPropertiesThrombus Aging +----+---------------+---------+-----------+----------+--------------+ CFV Full           Yes      Yes                                 +----+---------------+---------+-----------+----------+--------------+     Summary: Right: Findings consistent with acute deep vein thrombosis involving the right posterior tibial veins, and right peroneal veins. Patient had SVT in varicosities in 2013 and chronic SVT at the [facility] which appear resolved Left: No evidence of common femoral vein obstruction.  *See table(s) above for measurements and observations. Electronically signed by Harold Barban MD on 08/11/2019 at 4:35:52 PM.    Final       Subjective: Feeling well.  A little nausea today.  No confusion, fever, abdominal pain, dizziness with standing.   Discharge Exam: Vitals:   08/22/19 1256 08/22/19 1259  BP: 117/61 (!) 114/59  Pulse:    Resp:    Temp:    SpO2:     Vitals:   08/21/19 2017 08/22/19 0604 08/22/19 1256 08/22/19 1259  BP: (!) 152/64 105/60 117/61 (!) 114/59  Pulse: 68 66    Resp: 16 12    Temp: 98.5 F (36.9 C) 98.4 F (36.9 C)    TempSrc: Oral Oral    SpO2: 100% 100%    Weight:  81.2 kg    Height:        General: Pt is alert, awake, not in acute distress Cardiovascular: RRR, nl S1-S2, no murmurs appreciated.   No LE edema.   Respiratory: Normal respiratory rate and rhythm.  CTAB without rales or wheezes. Abdominal: Abdomen soft and non-tender.  No distension or HSM.   Neuro/Psych: Strength symmetric in upper and lower extremities.  Judgment and insight appear normal.   The results of significant diagnostics from this hospitalization (including imaging, microbiology, ancillary and laboratory) are listed below for reference.     Microbiology: Recent Results (from the past 240 hour(s))  SARS CORONAVIRUS 2 (TAT 6-24 HRS)  Nasopharyngeal Nasopharyngeal Swab     Status: None   Collection Time: 08/20/19  5:59 PM   Specimen: Nasopharyngeal Swab  Result Value Ref Range Status   SARS Coronavirus 2 NEGATIVE NEGATIVE Final    Comment: (NOTE) SARS-CoV-2 target nucleic acids are NOT DETECTED. The SARS-CoV-2 RNA is generally detectable in upper and lower respiratory specimens during the acute phase of infection. Negative results do not preclude SARS-CoV-2 infection, do not rule out co-infections with other pathogens, and should not be used  as the sole basis for treatment or other patient management decisions. Negative results must be combined with clinical observations, patient history, and epidemiological information. The expected result is Negative. Fact Sheet for Patients: SugarRoll.be Fact Sheet for Healthcare Providers: https://www.woods-mathews.com/ This test is not yet approved or cleared by the [place] FDA and  has been authorized for detection and/or diagnosis of SARS-CoV-2 by FDA under an Emergency Use Authorization (EUA). This EUA will remain  in effect (meaning this test can be used) for the duration of the COVID-19 declaration under Section 56 4(b)(1) of the Act, 21 U.S.C. section 360bbb-3(b)(1), unless the authorization is terminated or revoked sooner. Performed at [facility], [place] [address], [place], [place] [address]      Labs: BNP (last 3 results) Recent Labs    06/09/19 0206 06/24/19 1550 08/10/19 1441  BNP 888.9* 159.8* 588.5*   Basic Metabolic Panel: Recent Labs  Lab 08/16/19 0338 08/18/19 0325 08/19/19 0337 08/20/19 0957 08/22/19 0317  NA 138 137 142 138 138  K 4.4 5.2* 3.4* 3.8 3.5  CL 102 102 104 103 106  CO2 '26 25 24 24 24  ' GLUCOSE 88 187* 65* 243* 70  BUN 32* 38* 44* 41* 40*  CREATININE 1.78* 2.19* 2.06* 1.74* 1.69*  CALCIUM 9.0 8.7* 8.5* 8.7* 8.1*   Liver Function Tests: No results for input(s): AST, ALT,  ALKPHOS, BILITOT, PROT, ALBUMIN in the last 168 hours. No results for input(s): LIPASE, AMYLASE in the last 168 hours. No results for input(s): AMMONIA in the last 168 hours. CBC: Recent Labs  Lab 08/16/19 0338 08/17/19 0335 08/18/19 0325 08/20/19 0957 08/22/19 0317  WBC 2.9* 3.1* 3.5* 2.7* 2.6*  HGB 8.9* 9.5* 9.3* 10.0* 8.9*  HCT 28.2* 30.0* 29.0* 31.4* 27.8*  MCV 94.6 95.8 94.8 95.7 95.2  PLT 114* 127* 124* 122* 112*   Cardiac Enzymes: No results for input(s): CKTOTAL, CKMB, CKMBINDEX, TROPONINI in the last 168 hours. BNP: Invalid input(s): POCBNP CBG: Recent Labs  Lab 08/21/19 1643 08/21/19 2141 08/22/19 0609 08/22/19 0908 08/22/19 1336  GLUCAP 124* 161* 58* 175* 156*   D-Dimer No results for input(s): DDIMER in the last 72 hours. Hgb A1c No results for input(s): HGBA1C in the last 72 hours. Lipid Profile No results for input(s): CHOL, HDL, LDLCALC, TRIG, CHOLHDL, LDLDIRECT in the last 72 hours. Thyroid function studies No results for input(s): TSH, T4TOTAL, T3FREE, THYROIDAB in the last 72 hours.  Invalid input(s): FREET3 Anemia work up No results for input(s): VITAMINB12, FOLATE, FERRITIN, TIBC, IRON, RETICCTPCT in the last 72 hours. Urinalysis    Component Value Date/Time   COLORURINE YELLOW 08/10/2019 1624   APPEARANCEUR HAZY (A) 08/10/2019 1624   LABSPEC 1.008 08/10/2019 1624   PHURINE 5.0 08/10/2019 1624   GLUCOSEU NEGATIVE 08/10/2019 1624   HGBUR NEGATIVE 08/10/2019 1624   BILIRUBINUR NEGATIVE 08/10/2019 1624   BILIRUBINUR negative 05/21/2019 1448   KETONESUR NEGATIVE 08/10/2019 1624   PROTEINUR NEGATIVE 08/10/2019 1624   UROBILINOGEN 0.2 05/21/2019 1448   UROBILINOGEN 0.2 10/12/2012 2037   NITRITE NEGATIVE 08/10/2019 1624   LEUKOCYTESUR LARGE (A) 08/10/2019 1624   Sepsis Labs Invalid input(s): PROCALCITONIN,  WBC,  LACTICIDVEN Microbiology Recent Results (from the past 240 hour(s))  SARS CORONAVIRUS 2 (TAT 6-24 HRS) Nasopharyngeal  Nasopharyngeal Swab     Status: None   Collection Time: 08/20/19  5:59 PM   Specimen: Nasopharyngeal Swab  Result Value Ref Range Status   SARS Coronavirus 2 NEGATIVE NEGATIVE Final    Comment: (  NOTE) SARS-CoV-2 target nucleic acids are NOT DETECTED. The SARS-CoV-2 RNA is generally detectable in upper and lower respiratory specimens during the acute phase of infection. Negative results do not preclude SARS-CoV-2 infection, do not rule out co-infections with other pathogens, and should not be used as the sole basis for treatment or other patient management decisions. Negative results must be combined with clinical observations, patient history, and epidemiological information. The expected result is Negative. Fact Sheet for Patients: SugarRoll.be Fact Sheet for Healthcare Providers: https://www.woods-mathews.com/ This test is not yet approved or cleared by the [place] FDA and  has been authorized for detection and/or diagnosis of SARS-CoV-2 by FDA under an Emergency Use Authorization (EUA). This EUA will remain  in effect (meaning this test can be used) for the duration of the COVID-19 declaration under Section 56 4(b)(1) of the Act, 21 U.S.C. section 360bbb-3(b)(1), unless the authorization is terminated or revoked sooner. Performed at [facility] [facility], [place] [address] [place], [place] [address]      Time coordinating discharge: 45 minutes      SIGNED:   Edwin Dada, MD  [facility] 08/22/2019, 2:19 PM

## 2019-08-22 NOTE — Care Management Important Message (Signed)
Important Message  Patient Details  Name: [name] MRN: [id number] Date of Birth: [birth year]   Medicare Important Message Given:  Yes     [name] 08/22/2019, 1:43 PM

## 2019-08-22 NOTE — Chronic Care Management (AMB) (Signed)
  Chronic Care Management   Outreach Note  08/22/2019 Name: [name] MRN: [id number] DOB: 11/18/1932  Referred by: [name], MD Reason for referral : Care Coordination   SW placed an unsuccessful outbound call to the patients daughter in response to a voice message requesting information on [facility]. SW left a HIPAA compliant voice message requesting a return call.  Follow Up Plan: SW will follow up with [facility] over the next week.  [name], BSW, CDP Social Worker, Certified Dementia Practitioner [place] [facility] [phone]

## 2019-08-23 DIAGNOSIS — R269 Unspecified abnormalities of gait and mobility: Secondary | ICD-10-CM | POA: Diagnosis not present

## 2019-08-23 DIAGNOSIS — I951 Orthostatic hypotension: Secondary | ICD-10-CM | POA: Diagnosis not present

## 2019-08-23 DIAGNOSIS — E11649 Type 2 diabetes mellitus with hypoglycemia without coma: Secondary | ICD-10-CM | POA: Diagnosis not present

## 2019-08-23 DIAGNOSIS — D61818 Other pancytopenia: Secondary | ICD-10-CM | POA: Diagnosis not present

## 2019-08-27 ENCOUNTER — Ambulatory Visit: Payer: Self-pay

## 2019-08-27 ENCOUNTER — Telehealth: Payer: Self-pay

## 2019-08-27 DIAGNOSIS — R5381 Other malaise: Secondary | ICD-10-CM

## 2019-08-27 DIAGNOSIS — I5032 Chronic diastolic (congestive) heart failure: Secondary | ICD-10-CM

## 2019-08-27 DIAGNOSIS — E1122 Type 2 diabetes mellitus with diabetic chronic kidney disease: Secondary | ICD-10-CM

## 2019-08-27 DIAGNOSIS — N184 Chronic kidney disease, stage 4 (severe): Secondary | ICD-10-CM

## 2019-08-27 NOTE — Chronic Care Management (AMB) (Signed)
  Chronic Care Management   Outreach Note  08/27/2019 Name: [name] MRN: [id number] DOB: [birth year]  Referred by: [name], MD Reason for referral : Care Coordination   Second unsuccessful outbound call placed to the patients daughter in response to a voice message received requesting information on [facility]. SW left a HIPAA compliant voice message requesting a return call.  Follow Up Plan: SW will follow up regarding patient care needs over the next three weeks. Of note, the patient is currently in short-term rehab at [facility].  [name], BSW, CDP Social Worker, Certified Dementia Practitioner [place] [facility] [facility] [phone]

## 2019-08-29 ENCOUNTER — Telehealth: Payer: Self-pay | Admitting: *Deleted

## 2019-08-29 ENCOUNTER — Ambulatory Visit: Payer: Self-pay

## 2019-08-29 DIAGNOSIS — I951 Orthostatic hypotension: Secondary | ICD-10-CM | POA: Diagnosis not present

## 2019-08-29 DIAGNOSIS — I5032 Chronic diastolic (congestive) heart failure: Secondary | ICD-10-CM

## 2019-08-29 DIAGNOSIS — C50911 Malignant neoplasm of unspecified site of right female breast: Secondary | ICD-10-CM | POA: Diagnosis not present

## 2019-08-29 DIAGNOSIS — I471 Supraventricular tachycardia: Secondary | ICD-10-CM | POA: Diagnosis not present

## 2019-08-29 DIAGNOSIS — Z170 Estrogen receptor positive status [ER+]: Secondary | ICD-10-CM

## 2019-08-29 DIAGNOSIS — E1122 Type 2 diabetes mellitus with diabetic chronic kidney disease: Secondary | ICD-10-CM

## 2019-08-29 DIAGNOSIS — I130 Hypertensive heart and chronic kidney disease with heart failure and stage 1 through stage 4 chronic kidney disease, or unspecified chronic kidney disease: Secondary | ICD-10-CM

## 2019-08-29 DIAGNOSIS — I82401 Acute embolism and thrombosis of unspecified deep veins of right lower extremity: Secondary | ICD-10-CM | POA: Diagnosis not present

## 2019-08-29 DIAGNOSIS — C50311 Malignant neoplasm of lower-inner quadrant of right female breast: Secondary | ICD-10-CM

## 2019-08-29 NOTE — Telephone Encounter (Signed)
Spoke to pt daughter. Was informed pt is currently at [facility]. After receiving appt for diag mammo to evaluate response to AI, called [facility]. Left vm for transportation coordinator with appt date and time as well as location of appt. Gave contact information for questions.

## 2019-08-30 ENCOUNTER — Ambulatory Visit: Payer: Self-pay

## 2019-08-30 DIAGNOSIS — E1122 Type 2 diabetes mellitus with diabetic chronic kidney disease: Secondary | ICD-10-CM

## 2019-08-30 DIAGNOSIS — Z170 Estrogen receptor positive status [ER+]: Secondary | ICD-10-CM

## 2019-08-30 DIAGNOSIS — I130 Hypertensive heart and chronic kidney disease with heart failure and stage 1 through stage 4 chronic kidney disease, or unspecified chronic kidney disease: Secondary | ICD-10-CM

## 2019-08-30 DIAGNOSIS — C50311 Malignant neoplasm of lower-inner quadrant of right female breast: Secondary | ICD-10-CM

## 2019-08-30 NOTE — Patient Instructions (Signed)
Visit Information  Goals Addressed    . "I'm concerned about my grandmother being on Coumadin"       Granddaughter stated Current Barriers:  [name] Knowledge Deficits related to best anticoagulant treatment options  . Hx of DVT . Hx of GI/nose bleed while on Coumadin therapy  Nurse Case Manager Clinical Goal(s):  [name] Over the next 30 days, patient/granddaughter will verbalize understanding of plan for patient's long term anticoagulant therapy  CCM RN CM Interventions:  08/29/19 call completed with granddaughter [name]   . Evaluation of current treatment plan related to anticoagulation therapy and patient's adherence to plan as established by provider. . Advised patient to discuss their concerns related to patient's prescribed anticoagulation therapy with the prescribed MD managing patient's care at [facility] . Reviewed medications with patient and discussed [place] MD has prescribed for patient to take Coumadin; discussed granddaughters concerns related to patient having past adverse bleeding events, GI and nose from Coumadin use resulting in a hospital admission; discussed patient was changed to Eliquis with good effectiveness and the patient/family would like for her to d/c the Coumadin and resume Eliquis as they prepare for patient's d/c home; educated granddaughter with rationale on the importance of patient being supervised with any/all anticoagulant drugs prescribed to ensure no there are no missed doses  . Collaborated with [name] regarding patient and granddaughter's concerns related her anticoagulation therapy - received reply from [name] advising she did not prescribe Ms. Harston Eliquis in the past and the patient/family should contact patient's Cardiologist to discuss concerns/recommendations for anticoagulation- CCM RN will notify patient's granddaughter accordingly and encourage she reach out to the Cardiologist for direction on the best anticoagulation option   . Discussed plans with patient for ongoing care management follow up and provided patient with direct contact information for care management team  Patient Self Care Activities:  . Attends all scheduled provider appointments . Calls pharmacy for medication refills . Calls provider office for new concerns or questions  Initial goal documentation       The patient verbalized understanding of instructions provided today and declined a print copy of patient instruction materials.   Telephone follow up appointment with care management team member scheduled for: 09/30/19  [name], RN, BSN, CCM Care Management Coordinator [place] [facility]  Direct Phone: [phone]

## 2019-08-30 NOTE — Chronic Care Management (AMB) (Signed)
Chronic Care Management   Follow Up Note   08/29/2019 Name: [name] MRN: [id number] DOB: 04/14/1933  Referred by: [name], MD Reason for referral : Chronic Care Management (CCM RNCM Inbound Telephone Follow up)   [name] is a 85 y.o. year old female who is a primary care patient of [name], MD. The CCM team was consulted for assistance with chronic disease management and care coordination needs.    Review of patient status, including review of consultants reports, relevant laboratory and other test results, and collaboration with appropriate care team members and the patient's provider was performed as part of comprehensive patient evaluation and provision of chronic care management services.    SDOH (Social Determinants of Health) screening performed today: None. See Care Plan for related entries.   Inbound call received from [name] regarding concerns related to [name]'s prescribed treatment plan for anticoagulation.   Outpatient Encounter Medications as of 08/29/2019  Medication Sig Note  . acetaminophen (TYLENOL) 500 MG tablet Take 1,000 mg by mouth every 6 (six) hours as needed for mild pain, moderate pain or headache.    [name] amiodarone (PACERONE) 200 MG tablet Take 1 tablet (200 mg total) by mouth daily.   [name] anastrozole (ARIMIDEX) 1 MG tablet Take 1 tablet (1 mg total) by mouth daily.   [name] atorvastatin (LIPITOR) 20 MG tablet Take 1 tablet (20 mg total) by mouth at bedtime.   . BD PEN NEEDLE NANO U/F 32G X 4 MM MISC Use as directed   . calcium carbonate (OS-CAL) 600 MG TABS tablet Take 600 mg by mouth daily with breakfast.    . carvedilol (COREG) 12.5 MG tablet Take 12.5 mg by mouth 2 (two) times daily with a meal. 08/10/2019: Regimen confirmed to be accurate by the patient  . Cholecalciferol (VITAMIN D-3 PO) Take 1 capsule by mouth daily after breakfast.   . Cyanocobalamin (B-12 PO) Take 1 tablet by mouth daily after breakfast.   .  diclofenac sodium (VOLTAREN) 1 % GEL Apply 2 g topically 4 (four) times daily. (Patient taking differently: Apply 2 g topically See admin instructions. Apply 2 grams to knees at bedtime and an additional 2 grams three times a day as needed for pain)   . ferrous sulfate 325 (65 FE) MG tablet Take 325 mg by mouth daily with breakfast.   . glucose blood (ACCU-CHEK GUIDE) test strip Use as instructed to check blood sugars 3 times per day prn dx: e11.22   . HUMALOG KWIKPEN 200 UNIT/ML SOPN Inject 4-8 Units into the skin 3 (three) times daily. Per sliding scale (Patient taking differently: Inject 4-8 Units into the skin See admin instructions. Inject 4-8 units into the skin three times a day before meals, per sliding scale) 08/10/2019: Regimen confirmed to be accurate by the patient  . insulin detemir (LEVEMIR) 100 UNIT/ML injection Inject 0.18 mLs (18 Units total) into the skin daily.   . isosorbide-hydrALAZINE (BIDIL) 20-37.5 MG tablet Take 1 tablet by mouth 3 (three) times daily.    . magnesium oxide (MAG-OX) 400 MG tablet Take 400 mg by mouth daily.   [name] omeprazole (PRILOSEC) 40 MG capsule TAKE ONE CAPSULE BY MOUTH DAILY BEFORE A MEAL (Patient taking differently: Take 40 mg by mouth 2 (two) times daily before a meal. )   . oxybutynin (DITROPAN) 5 MG tablet Take 1 tablet (5 mg total) by mouth 2 (two) times daily.   . potassium chloride SA (K-DUR) 20 MEQ tablet Take  2 tablets (40 mEq total) by mouth daily.   . pregabalin (LYRICA) 75 MG capsule Take 1 capsule (75 mg total) by mouth at bedtime.   . senna-docusate (SENOKOT-S) 8.6-50 MG tablet Take 2 tablets by mouth at bedtime as needed for mild constipation.   . torsemide (DEMADEX) 20 MG tablet Take 1 tablet (20 mg total) by mouth 2 (two) times daily.   . traMADol (ULTRAM) 50 MG tablet Take 1 tablet (50 mg total) by mouth 2 (two) times daily as needed.   . warfarin (COUMADIN) 5 MG tablet Take 1 tablet (5 mg total) by mouth daily at 6 PM.    No  facility-administered encounter medications on file as of 08/29/2019.      Goals Addressed    . "I'm concerned about my grandmother being on Coumadin"       Granddaughter stated Current Barriers:  [name] Knowledge Deficits related to best anticoagulant treatment options  . Hx of DVT . Hx of GI/nose bleed while on Coumadin therapy  Nurse Case Manager Clinical Goal(s):  [name] Over the next 30 days, patient/granddaughter will verbalize understanding of plan for patient's long term anticoagulant therapy  CCM RN CM Interventions:  08/29/19 call completed with [name]   . Evaluation of current treatment plan related to anticoagulation therapy and patient's adherence to plan as established by provider. . Advised patient to discuss their concerns related to patient's prescribed anticoagulation therapy with the prescribed MD managing patient's care at [facility] . Reviewed medications with patient and discussed Cam[place] MD has prescribed for patient to take Coumadin; discussed granddaughters concerns related to patient having past adverse bleeding events, GI and nose from Coumadin use resulting in a hospital admission; discussed patient was changed to Eliquis with good effectiveness and the patient/family would like for her to d/c the Coumadin and resume Eliquis as they prepare for patient's d/c home; educated granddaughter with rationale on the importance of patient being supervised with any/all anticoagulant drugs prescribed to ensure no there are no missed doses  . Collaborated with [name] regarding patient and granddaughter's concerns related her anticoagulation therapy - received reply from [name] advising she did not prescribe [name] Eliquis in the past and the patient/family should contact patient's Cardiologist to discuss concerns/recommendations for anticoagulation- CCM RN will notify patient's granddaughter accordingly and encourage she reach out to the Cardiologist  for direction on the best anticoagulation option  . Discussed plans with patient for ongoing care management follow up and provided patient with direct contact information for care management team  Patient Self Care Activities:  . Attends all scheduled provider appointments . Calls pharmacy for medication refills . Calls provider office for new concerns or questions  Initial goal documentation          Telephone follow up appointment with care management team member scheduled for: 09/30/19   [name], RN, BSN, CCM Care Management Coordinator [place] [facility]  Direct Phone: [phone]

## 2019-09-02 ENCOUNTER — Telehealth: Payer: Self-pay

## 2019-09-02 ENCOUNTER — Telehealth: Payer: Self-pay | Admitting: Interventional Cardiology

## 2019-09-02 NOTE — Telephone Encounter (Signed)
Call returned to [place] with APP for [name].  Advised per review of Pt chart she was changed to Eliquis during hospitalization in January (see November 09, 2018 discharge summary).  Pt has been seen by cardiac office 4 times since that day and Eliquis was documented as Pt's [place] at every visit.

## 2019-09-02 NOTE — Telephone Encounter (Signed)
Caleen Jobs was calling on behalf of  Dr [name] @ [facility] in regards to this mutual patient. [name] wanted to know if the cardiologist was the one who started the patient on Eliquis. The patient's granddaughter was under the impression that the patient was on Coumadin, and the PCP has on file that the patient is on Coumadin. The granddaughter contacted [name] and just needs clarification.   The number provided is the doctor line. Please call [name] to confirm the orders

## 2019-09-02 NOTE — Telephone Encounter (Signed)
I spoke with the pt's granddaughter and her daughter [name] and [name] to let them know that the pt was stated on Eliquis in January, the cardiologist's office ( Dr. Lovena Le ) said that the pt is supposed to be on the Eliquis. The hospital notes from January says that the pt was given the prescription at discharge.  The pt was switched back to coumadin from the eliquis December 29th when she was discharged from the hospital because the note says that the eliquis wasn't helping because the pt had another DVT.  Ms. [name] was told to contact the cardiologist office and that the [place] place.

## 2019-09-03 ENCOUNTER — Ambulatory Visit: Payer: Medicare Other | Admitting: Orthopaedic Surgery

## 2019-09-03 ENCOUNTER — Ambulatory Visit (INDEPENDENT_AMBULATORY_CARE_PROVIDER_SITE_OTHER): Payer: Medicare Other | Admitting: Pharmacist

## 2019-09-03 DIAGNOSIS — Z794 Long term (current) use of insulin: Secondary | ICD-10-CM | POA: Diagnosis not present

## 2019-09-03 DIAGNOSIS — I5032 Chronic diastolic (congestive) heart failure: Secondary | ICD-10-CM | POA: Diagnosis not present

## 2019-09-03 DIAGNOSIS — E1122 Type 2 diabetes mellitus with diabetic chronic kidney disease: Secondary | ICD-10-CM

## 2019-09-03 DIAGNOSIS — N184 Chronic kidney disease, stage 4 (severe): Secondary | ICD-10-CM

## 2019-09-03 NOTE — Patient Instructions (Signed)
Visit Information  Goals Addressed    . "I'm concerned about my grandmother being on Coumadin"       Granddaughter stated Current Barriers:  [name] Knowledge Deficits related to best anticoagulant treatment options  . Hx of DVT . Hx of GI/nose bleed while on Coumadin therapy  Nurse Case Manager Clinical Goal(s):  [name] Over the next 30 days, patient/granddaughter will verbalize understanding of plan for patient's long term anticoagulant therapy  CCM RN CM Interventions:  08/29/19 call completed with granddaughter [name]   . Evaluation of current treatment plan related to anticoagulation therapy and patient's adherence to plan as established by provider. . Advised patient to discuss their concerns related to patient's prescribed anticoagulation therapy with the prescribed MD managing patient's care at [facility] . Reviewed medications with patient and discussed [place] MD has prescribed for patient to take Coumadin; discussed granddaughters concerns related to patient having past adverse bleeding events, GI and nose from Coumadin use resulting in a hospital admission; discussed patient was changed to Eliquis with good effectiveness and the patient/family would like for her to d/c the Coumadin and resume Eliquis as they prepare for patient's d/c home; educated granddaughter with rationale on the importance of patient being supervised with any/all anticoagulant drugs prescribed to ensure no there are no missed doses  . Collaborated with [name] regarding patient and granddaughter's concerns related her anticoagulation therapy - received reply from [name] advising she did not prescribe Ms. Marrs Eliquis in the past and the patient/family should contact patient's Cardiologist to discuss concerns/recommendations for anticoagulation- CCM RN will notify patient's granddaughter accordingly and encourage she reach out to the Cardiologist for direction on the best anticoagulation option   . Discussed plans with patient for ongoing care management follow up and provided patient with direct contact information for care management team  08/30/19 Placed outbound call to granddaughter [employer]   . Discussed [name] stated she did not prescribe the Eliquis for Ms. [name]; discussed Cardiology would be the prescribing physician for the Eliquis and the patient/family should refer to this physician for advice concerning the best anticoagulation therapy for Ms. Fabro . Reviewed and discussed prior medical events and determined Dr. Lovena Le Cardiology initially prescribed Eliquis for patient . Discussed [name] will f/u with Dr. Lovena Le and the Treating physician at [facility] to discuss best recommendations for anticoagulation . Discussed plans for ongoing care management follow up once Ms. Predmore has been d/c home; confirmed [name] and dtr [name] have the direct contact information for the care management team   Patient Self Care Activities:  . Attends all scheduled provider appointments . Calls pharmacy for medication refills . Calls provider office for new concerns or questions  Please see past updates related to this goal by clicking on the "Past Updates" button in the selected goal        The patient verbalized understanding of instructions provided today and declined a print copy of patient instruction materials.   Telephone follow up appointment with care management team member scheduled for: 09/30/19  [name], RN, BSN, CCM Care Management Coordinator [place] [facility]  Direct Phone: [phone]

## 2019-09-03 NOTE — Chronic Care Management (AMB) (Signed)
Chronic Care Management   Follow Up Note   08/30/2019 Name: [name] MRN: [id number] DOB: [birth year]  Referred by: Stephanie Chard, [name] Reason for referral : Chronic Care Management (CCM RNCM Telephone Follow up )   [name] is a 85 y.o. year old female who is a primary care patient of Stephanie Chard, [name]. The CCM team was consulted for assistance with chronic disease management and care coordination needs.    Review of patient status, including review of consultants reports, relevant laboratory and other test results, and collaboration with appropriate care team members and the patient's provider was performed as part of comprehensive patient evaluation and provision of chronic care management services.    SDOH (Social Determinants of Health) screening performed today: None. See Care Plan for related entries.   Inbound call received from [name] to discuss concerns regarding patient's anticoagulation therapy.   Outpatient Encounter Medications as of 08/30/2019  Medication Sig Note   acetaminophen (TYLENOL) 500 MG tablet Take 1,000 mg by mouth every 6 (six) hours as needed for mild pain, moderate pain or headache.     amiodarone (PACERONE) 200 MG tablet Take 1 tablet (200 mg total) by mouth daily.    anastrozole (ARIMIDEX) 1 MG tablet Take 1 tablet (1 mg total) by mouth daily.    atorvastatin (LIPITOR) 20 MG tablet Take 1 tablet (20 mg total) by mouth at bedtime.    BD PEN NEEDLE NANO U/F 32G X 4 MM MISC Use as directed    calcium carbonate (OS-CAL) 600 MG TABS tablet Take 600 mg by mouth daily with breakfast.     carvedilol (COREG) 12.5 MG tablet Take 12.5 mg by mouth 2 (two) times daily with a meal. 08/10/2019: Regimen confirmed to be accurate by the patient   Cholecalciferol (VITAMIN D-3 PO) Take 1 capsule by mouth daily after breakfast.    Cyanocobalamin (B-12 PO) Take 1 tablet by mouth daily after breakfast.    diclofenac sodium (VOLTAREN) 1 % GEL  Apply 2 g topically 4 (four) times daily. (Patient taking differently: Apply 2 g topically See admin instructions. Apply 2 grams to knees at bedtime and an additional 2 grams three times a day as needed for pain)    ferrous sulfate 325 (65 FE) MG tablet Take 325 mg by mouth daily with breakfast.    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 Per sliding scale (Patient taking differently: Inject 4-8 Units into the skin See admin instructions. Inject 4-8 units into the skin three times a day before meals, per sliding scale) 08/10/2019: Regimen confirmed to be accurate by the patient   insulin detemir (LEVEMIR) 100 UNIT/ML injection Inject 0.18 mLs (18 Units total) into the skin daily.    isosorbide-hydrALAZINE (BIDIL) 20-37.5 MG tablet Take 1 tablet by mouth 3 (three) times daily.     magnesium oxide (MAG-OX) 400 MG tablet Take 400 mg by mouth daily.    omeprazole (PRILOSEC) 40 MG capsule TAKE ONE CAPSULE BY MOUTH DAILY BEFORE A MEAL (Patient taking differently: Take 40 mg by mouth 2 (two) times daily before a meal. )    oxybutynin (DITROPAN) 5 MG tablet Take 1 tablet (5 mg total) by mouth 2 (two) times daily.    potassium chloride SA (K-DUR) 20 MEQ tablet Take 2 tablets (40 mEq total)  by mouth daily.    pregabalin (LYRICA) 75 MG capsule Take 1 capsule (75 mg total) by mouth at bedtime.    senna-docusate (SENOKOT-S) 8.6-50 MG tablet Take 2 tablets by mouth at bedtime as needed for mild constipation.    torsemide (DEMADEX) 20 MG tablet Take 1 tablet (20 mg total) by mouth 2 (two) times daily.    traMADol (ULTRAM) 50 MG tablet Take 1 tablet (50 mg total) by mouth 2 (two) times daily as needed.    warfarin (COUMADIN) 5 MG tablet Take 1 tablet (5 mg total) by mouth daily at 6 PM.    No facility-administered encounter medications on  file as of 08/30/2019.      Goals Addressed     "I'm concerned about my grandmother being on Coumadin"       Granddaughter stated Current Barriers:   Knowledge Deficits related to best anticoagulant treatment options   Hx of DVT  Hx of GI/nose bleed while on Coumadin therapy  Nurse Case Manager Clinical Goal(s):   Over the next 30 days, patient/granddaughter will verbalize understanding of plan for patient's long term anticoagulant therapy  CCM RN CM Interventions:  08/29/19 call completed with [name]    Evaluation of current treatment plan related to anticoagulation therapy and patient's adherence to plan as established by provider.  Advised patient to discuss their concerns related to patient's prescribed anticoagulation therapy with the prescribed [name] managing patient's care at [facility] medications with patient and discussed [name] has prescribed for patient to take Coumadin; discussed granddaughters concerns related to patient having past adverse bleeding events, GI and nose from Coumadin use resulting in a hospital admission; discussed patient was changed to Eliquis with good effectiveness and the patient/family would like for her to d/c the Coumadin and resume Eliquis as they prepare for patient's d/c home; educated granddaughter with rationale on the importance of patient being supervised with any/all anticoagulant drugs prescribed to ensure no there are no missed doses   Collaborated with Dr. Baird [name] regarding patient and granddaughter's concerns related her anticoagulation therapy - received reply from Dr. Baird [name] advising she did not prescribe [name] Eliquis in the past and the patient/family should contact patient's Cardiologist to discuss concerns/recommendations for anticoagulation- CCM RN will notify patient's granddaughter accordingly and encourage she reach out to the Cardiologist for direction on the best anticoagulation  option   Discussed plans with patient for ongoing care management follow up and provided patient with direct contact information for care management team  08/30/19 Placed outbound call to [name]    Discussed Dr. Baird [name] stated she did not prescribe the Eliquis for Ms. [name]; discussed Cardiology would be the prescribing physician for the Eliquis and the patient/family should refer to this physician for advice concerning the best anticoagulation therapy for Ms. Dellis  Reviewed and discussed prior medical events and determined Dr. Lovena Le Cardiology initially prescribed Eliquis for patient  Discussed [name] will f/u with Dr. Lovena Le and the Treating physician at [facility] to discuss best recommendations for anticoagulation  Discussed plans for ongoing care management follow up once Ms. Streetman has been d/c home; confirmed [name] and dtr [name] have the direct contact information for the care management team   Patient Self Care Activities:   Attends all scheduled provider appointments  Calls pharmacy for medication refills  Calls provider office for new concerns or questions  Please see past updates related to this goal by clicking on the "Past Updates" button in  the selected goal         Telephone follow up appointment with care management team member scheduled for: 09/30/19   [name], RN, BSN, CCM Care Management Coordinator [place] [facility]  Direct Phone: [phone]

## 2019-09-04 ENCOUNTER — Telehealth: Payer: Self-pay

## 2019-09-04 DIAGNOSIS — E11649 Type 2 diabetes mellitus with hypoglycemia without coma: Secondary | ICD-10-CM | POA: Diagnosis not present

## 2019-09-04 DIAGNOSIS — Z794 Long term (current) use of insulin: Secondary | ICD-10-CM | POA: Diagnosis not present

## 2019-09-05 DIAGNOSIS — I471 Supraventricular tachycardia: Secondary | ICD-10-CM | POA: Diagnosis not present

## 2019-09-05 DIAGNOSIS — E11649 Type 2 diabetes mellitus with hypoglycemia without coma: Secondary | ICD-10-CM | POA: Diagnosis not present

## 2019-09-05 DIAGNOSIS — I82401 Acute embolism and thrombosis of unspecified deep veins of right lower extremity: Secondary | ICD-10-CM | POA: Diagnosis not present

## 2019-09-05 DIAGNOSIS — C50911 Malignant neoplasm of unspecified site of right female breast: Secondary | ICD-10-CM | POA: Diagnosis not present

## 2019-09-05 DIAGNOSIS — E0822 Diabetes mellitus due to underlying condition with diabetic chronic kidney disease: Secondary | ICD-10-CM | POA: Diagnosis not present

## 2019-09-06 ENCOUNTER — Other Ambulatory Visit: Payer: Self-pay | Admitting: Hematology and Oncology

## 2019-09-06 ENCOUNTER — Ambulatory Visit
Admission: RE | Admit: 2019-09-06 | Discharge: 2019-09-06 | Disposition: A | Discharge status: Home / Self Care | Payer: Medicare Other | Source: Ambulatory Visit | Attending: Hematology and Oncology | Admitting: Hematology and Oncology

## 2019-09-06 ENCOUNTER — Other Ambulatory Visit: Payer: Self-pay

## 2019-09-06 DIAGNOSIS — Z170 Estrogen receptor positive status [ER+]: Secondary | ICD-10-CM

## 2019-09-06 DIAGNOSIS — R921 Mammographic calcification found on diagnostic imaging of breast: Secondary | ICD-10-CM | POA: Diagnosis not present

## 2019-09-06 DIAGNOSIS — C50311 Malignant neoplasm of lower-inner quadrant of right female breast: Secondary | ICD-10-CM

## 2019-09-06 DIAGNOSIS — N6489 Other specified disorders of breast: Secondary | ICD-10-CM | POA: Diagnosis not present

## 2019-09-06 NOTE — Patient Outreach (Signed)
[facility]) Care Management  09/06/2019  FAYLENE ALLERTON [birth year] [id number]   Medication Adherence call to Mrs. Kiley [employer] spoke with patients daughter she explain patient has been in the hospital,patient fell and now  is at rehab and they are providing with her medications.Mrs. Viramontes is showing past due on Lisinopril 2.5 mg under [place].   [place] [facility] Direct Dial [phone]  Fax [phone] [email or web address]

## 2019-09-09 ENCOUNTER — Telehealth: Payer: Self-pay

## 2019-09-09 ENCOUNTER — Ambulatory Visit: Payer: Self-pay

## 2019-09-09 ENCOUNTER — Telehealth: Payer: Self-pay | Admitting: Internal Medicine

## 2019-09-09 DIAGNOSIS — I951 Orthostatic hypotension: Secondary | ICD-10-CM | POA: Diagnosis not present

## 2019-09-09 DIAGNOSIS — I5032 Chronic diastolic (congestive) heart failure: Secondary | ICD-10-CM

## 2019-09-09 DIAGNOSIS — I509 Heart failure, unspecified: Secondary | ICD-10-CM

## 2019-09-09 DIAGNOSIS — G4733 Obstructive sleep apnea (adult) (pediatric): Secondary | ICD-10-CM

## 2019-09-09 DIAGNOSIS — I82401 Acute embolism and thrombosis of unspecified deep veins of right lower extremity: Secondary | ICD-10-CM | POA: Diagnosis not present

## 2019-09-09 DIAGNOSIS — I130 Hypertensive heart and chronic kidney disease with heart failure and stage 1 through stage 4 chronic kidney disease, or unspecified chronic kidney disease: Secondary | ICD-10-CM

## 2019-09-09 DIAGNOSIS — I471 Supraventricular tachycardia: Secondary | ICD-10-CM

## 2019-09-09 DIAGNOSIS — E1122 Type 2 diabetes mellitus with diabetic chronic kidney disease: Secondary | ICD-10-CM

## 2019-09-09 DIAGNOSIS — M4802 Spinal stenosis, cervical region: Secondary | ICD-10-CM | POA: Diagnosis not present

## 2019-09-09 DIAGNOSIS — Z794 Long term (current) use of insulin: Secondary | ICD-10-CM

## 2019-09-09 DIAGNOSIS — I69351 Hemiplegia and hemiparesis following cerebral infarction affecting right dominant side: Secondary | ICD-10-CM | POA: Diagnosis not present

## 2019-09-09 DIAGNOSIS — I69321 Dysphasia following cerebral infarction: Secondary | ICD-10-CM | POA: Diagnosis not present

## 2019-09-09 DIAGNOSIS — I447 Left bundle-branch block, unspecified: Secondary | ICD-10-CM

## 2019-09-09 DIAGNOSIS — I8390 Asymptomatic varicose veins of unspecified lower extremity: Secondary | ICD-10-CM

## 2019-09-09 DIAGNOSIS — M25512 Pain in left shoulder: Secondary | ICD-10-CM

## 2019-09-09 DIAGNOSIS — D631 Anemia in chronic kidney disease: Secondary | ICD-10-CM

## 2019-09-09 DIAGNOSIS — N183 Chronic kidney disease, stage 3 unspecified: Secondary | ICD-10-CM

## 2019-09-09 NOTE — Telephone Encounter (Signed)
Returned call to daughter.  Left message to return call.

## 2019-09-09 NOTE — Patient Instructions (Signed)
Visit Information  Goals Addressed            This Visit's Progress   . "I'm concerned about my grandmother being on Coumadin"       Granddaughter stated Current Barriers:  [name] Knowledge Deficits related to best anticoagulant treatment options  . Hx of DVT . Hx of GI/nose bleed while on Coumadin therapy  Nurse Case Manager Clinical Goal(s):  [name] Over the next 30 days, patient/granddaughter will verbalize understanding of plan for patient's long term anticoagulant therapy  CCM RN CM Interventions:  08/29/19 call completed with [name]   . Evaluation of current treatment plan related to anticoagulation therapy and patient's adherence to plan as established by provider. . Advised patient to discuss their concerns related to patient's prescribed anticoagulation therapy with the prescribed MD managing patient's care at [facility] . Reviewed medications with patient and discussed [place] MD has prescribed for patient to take Coumadin; discussed granddaughters concerns related to patient having past adverse bleeding events, GI and nose from Coumadin use resulting in a hospital admission; discussed patient was changed to Eliquis with good effectiveness and the patient/family would like for her to d/c the Coumadin and resume Eliquis as they prepare for patient's d/c home; educated granddaughter with rationale on the importance of patient being supervised with any/all anticoagulant drugs prescribed to ensure no there are no missed doses  . Collaborated with [name] regarding patient and granddaughter's concerns related her anticoagulation therapy - received reply from [name] advising she did not prescribe Ms. Grajales Eliquis in the past and the patient/family should contact patient's Cardiologist to discuss concerns/recommendations for anticoagulation- CCM RN will notify patient's granddaughter accordingly and encourage she reach out to the Cardiologist for direction on  the best anticoagulation option  . Discussed plans with patient for ongoing care management follow up and provided patient with direct contact information for care management team  08/30/19 Placed outbound call to granddaughter [employer]   . Discussed [name] stated she did not prescribe the Eliquis for [name]; discussed Cardiology would be the prescribing physician for the Eliquis and the patient/family should refer to this physician for advice concerning the best anticoagulation therapy for Ms. Balaguer . Reviewed and discussed prior medical events and determined Dr. Lovena Le Cardiology initially prescribed Eliquis for patient . Discussed [name] will f/u with Dr. Lovena Le and the Treating physician at [facility] to discuss best recommendations for anticoagulation . Discussed plans for ongoing care management follow up once Ms. Santacroce has been d/c home; confirmed [name] and dtr [name] have the direct contact information for the care management team   CCM PharmD Interventions:  09/03/19 call completed with [name]  . Discussed updates with daughter.  Encouraged [name] to call cardiologist (Dr. Theressa [name] provided) if she wishes for her grandmother to be switched to back to Eliquis.  It is my understanding that patient was switched to warfarin in the hospital due to a potential warfarin failure.  It was prescribed on discharge and continued at [facility].  Granddaughter verbalizes understanding.  . Discussed with her that any form of AC was a benefit vs. Risk issue.  Encouraged her to call if she has questions.  Patient Self Care Activities:  . Attends all scheduled provider appointments . Calls pharmacy for medication refills . Calls provider office for new concerns or questions  Please see past updates related to this goal by clicking on the "Past Updates" button in the selected goal  The patient verbalized understanding of instructions  provided today and declined a print copy of patient instruction materials.   The care management team will reach out to the patient again over the next 6-8 weeks.  SIGNATURE Regina Eck, PharmD, BCPS Clinical Pharmacist, [facility] [place]: [phone]

## 2019-09-09 NOTE — Progress Notes (Signed)
Chronic Care Management   Visit Note  09/03/2019 Name: [name] MRN: [id number] DOB: December 21, 1932  Referred by: Stephanie Chard, [name] Reason for referral : Chronic Care Management   [name] is a 85 y.o. year old female who is a primary care patient of Stephanie Chard, [name]. The CCM team was consulted for assistance with chronic disease management and care coordination needs related to Atrial Fibrillation  Review of patient status, including review of consultants reports, relevant laboratory and other test results, and collaboration with appropriate care team members and the patient's provider was performed as part of comprehensive patient evaluation and provision of chronic care management services.    I spoke with [name]'s granddaughter, [name], by telephone today.  Medications: Outpatient Encounter Medications as of 09/03/2019  Medication Sig Note  . acetaminophen (TYLENOL) 500 MG tablet Take 1,000 mg by mouth every 6 (six) hours as needed for mild pain, moderate pain or headache.    [name] amiodarone (PACERONE) 200 MG tablet Take 1 tablet (200 mg total) by mouth daily.   [name] anastrozole (ARIMIDEX) 1 MG tablet Take 1 tablet (1 mg total) by mouth daily.   [name] atorvastatin (LIPITOR) 20 MG tablet Take 1 tablet (20 mg total) by mouth at bedtime.   . BD PEN NEEDLE NANO U/F 32G X 4 MM MISC Use as directed   . calcium carbonate (OS-CAL) 600 MG TABS tablet Take 600 mg by mouth daily with breakfast.    . carvedilol (COREG) 12.5 MG tablet Take 12.5 mg by mouth 2 (two) times daily with a meal. 08/10/2019: Regimen confirmed to be accurate by the patient  . Cholecalciferol (VITAMIN D-3 PO) Take 1 capsule by mouth daily after breakfast.   . Cyanocobalamin (B-12 PO) Take 1 tablet by mouth daily after breakfast.   . diclofenac sodium (VOLTAREN) 1 % GEL Apply 2 g topically 4 (four) times daily. (Patient taking differently: Apply 2 g topically See admin instructions. Apply 2 grams to knees at bedtime and  an additional 2 grams three times a day as needed for pain)   . ferrous sulfate 325 (65 FE) MG tablet Take 325 mg by mouth daily with breakfast.   . glucose blood (ACCU-CHEK GUIDE) test strip Use as instructed to check blood sugars 3 times per day prn dx: e11.22   . HUMALOG KWIKPEN 200 UNIT/ML SOPN Inject 4-8 Units into the skin 3 (three) times daily. Per sliding scale (Patient taking differently: Inject 4-8 Units into the skin See admin instructions. Inject 4-8 units into the skin three times a day before meals, per sliding scale) 08/10/2019: Regimen confirmed to be accurate by the patient  . insulin detemir (LEVEMIR) 100 UNIT/ML injection Inject 0.18 mLs (18 Units total) into the skin daily.   . isosorbide-hydrALAZINE (BIDIL) 20-37.5 MG tablet Take 1 tablet by mouth 3 (three) times daily.    . magnesium oxide (MAG-OX) 400 MG tablet Take 400 mg by mouth daily.   [name] omeprazole (PRILOSEC) 40 MG capsule TAKE ONE CAPSULE BY MOUTH DAILY BEFORE A MEAL (Patient taking differently: Take 40 mg by mouth 2 (two) times daily before a meal. )   . oxybutynin (DITROPAN) 5 MG tablet Take 1 tablet (5 mg total) by mouth 2 (two) times daily.   . potassium chloride SA (K-DUR) 20 MEQ tablet Take 2 tablets (40 mEq total) by mouth daily.   . pregabalin (LYRICA) 75 MG capsule Take 1 capsule (75 mg total) by mouth at bedtime.   . senna-docusate (SENOKOT-S)  8.6-50 MG tablet Take 2 tablets by mouth at bedtime as needed for mild constipation.   . torsemide (DEMADEX) 20 MG tablet Take 1 tablet (20 mg total) by mouth 2 (two) times daily.   . traMADol (ULTRAM) 50 MG tablet Take 1 tablet (50 mg total) by mouth 2 (two) times daily as needed.   . warfarin (COUMADIN) 5 MG tablet Take 1 tablet (5 mg total) by mouth daily at 6 PM.    No facility-administered encounter medications on file as of 09/03/2019.      Objective:   Goals Addressed            This Visit's Progress   . "I'm concerned about my grandmother being on  Coumadin"       Granddaughter stated Current Barriers:  [name] Knowledge Deficits related to best anticoagulant treatment options  . Hx of DVT . Hx of GI/nose bleed while on Coumadin therapy  Nurse Case Manager Clinical Goal(s):  [name] Over the next 30 days, patient/granddaughter will verbalize understanding of plan for patient's long term anticoagulant therapy  CCM RN CM Interventions:  08/29/19 call completed with [name]   . Evaluation of current treatment plan related to anticoagulation therapy and patient's adherence to plan as established by provider. . Advised patient to discuss their concerns related to patient's prescribed anticoagulation therapy with the prescribed [name] managing patient's care at [facility] . Reviewed medications with patient and discussed [name] has prescribed for patient to take Coumadin; discussed granddaughters concerns related to patient having past adverse bleeding events, GI and nose from Coumadin use resulting in a hospital admission; discussed patient was changed to [name] with good effectiveness and the patient/family would like for her to d/c the Coumadin and resume [name] as they prepare for patient's d/c home; educated granddaughter with rationale on the importance of patient being supervised with any/all anticoagulant drugs prescribed to ensure no there are no missed doses  . Collaborated with [name] regarding patient and granddaughter's concerns related her anticoagulation therapy - received reply from [name] advising she did not prescribe [name] [name] in the past and the patient/family should contact patient's Cardiologist to discuss concerns/recommendations for anticoagulation- CCM RN will notify patient's granddaughter accordingly and encourage she reach out to the Cardiologist for direction on the best anticoagulation option  . Discussed plans with patient for ongoing care management follow up and provided patient  with direct contact information for care management team  08/30/19 Placed outbound call to granddaughter [employer]   . Discussed [name] stated she did not prescribe the [name] for Ms. [name]; discussed Cardiology would be the prescribing physician for the [name] and the patient/family should refer to this physician for advice concerning the best anticoagulation therapy for Ms. Dooner . Reviewed and discussed prior medical events and determined Dr. Lovena Le Cardiology initially prescribed [name] for patient . Discussed [name] will f/u with Dr. Lovena Le and the Treating physician at [facility] to discuss best recommendations for anticoagulation . Discussed plans for ongoing care management follow up once Ms. Baker has been d/c home; confirmed [name] and dtr [name] have the direct contact information for the care management team   CCM PharmD Interventions:  09/03/19 call completed with [name]  . Discussed updates with daughter.  Encouraged [name] to call cardiologist (Dr. Theressa Stamps provided) if she wishes for her grandmother to be switched to back to [name].  It is my understanding that patient was switched to warfarin in the hospital  due to a potential warfarin failure.  It was prescribed on discharge and continued at [facility].  Granddaughter verbalizes understanding.  . Discussed with her that any form of AC was a benefit vs. Risk issue.  Encouraged her to call if she has questions.  Patient Self Care Activities:  . Attends all scheduled provider appointments . Calls pharmacy for medication refills . Calls provider office for new concerns or questions  Please see past updates related to this goal by clicking on the "Past Updates" button in the selected goal          Plan:   The care management team will reach out to the patient again over the next 6-8 weeks  Provider Signature Regina Eck, PharmD, [place] Pharmacist,  [facility] [place]: [phone]

## 2019-09-09 NOTE — Telephone Encounter (Signed)
Called again and spoke with patient's daughter - she states pt was taking her meds out of her pill box and setting them to the side without taking them occasionally.  Pt just left [place] on Saturday and daughter wants her changed back to Eliquis. This is reasonable since non-adherence seems to have been contributing to DVT rather than therapeutic failure on Eliquis. Pt's daughter states she will be with pt more frequently to ensure she is taking her medications - she feels that pt will be able to adhere to twice daily Eliquis.   Will forward to Dr Lovena Le to ensure he is ok with pt changing from warfarin back to Eliquis. If so, we can coordinate in anticoag clinic.

## 2019-09-09 NOTE — Telephone Encounter (Signed)
Call back received from daughter.  Pt was changed to [name] at the beginning of this year.    During Pt's last hospitalization Pt was changed back to warfarin.  Pt's daughter calling because she would like Pt to be back on [name].  Should we schedule an appt with coumadin clinic to check INR and be able to give Pt direction on changing back to [name]?  Pt's daughter has availability this Friday November 20  Please advise.

## 2019-09-09 NOTE — Telephone Encounter (Signed)
Transition Care Management Follow-up Telephone Call  Date of discharge and from where: 09/07/2019  How have you been since you were released from the hospital? Pretty good  Any questions or concerns? no  Items Reviewed:  Did the pt receive and understand the discharge instructions provided? Yes   Medications obtained and verified? Yes   Any new allergies since your discharge? no  Dietary orders reviewed? Low sodium  Do you have support at home? Yes Other (ie: DME, Home Health, etc) yes   Functional Questionnaire: (I = Independent and D = Dependent) ADL's: d  Bathing/Dressing- d   Meal Prep- d  Eating- i  Maintaining continence-i  Transferring/Ambulation- i  Managing Meds-i  Follow up appointments reviewed:    PCP Hospital f/u appt confirmed? Yes  Scheduled to see J. Laurance Flatten on Nov 24  Specialist Hospital f/u appt confirmed? Yes nov 20,2020   .  Are transportation arrangements needed? no  If their condition worsens, is the pt aware to call  their PCP or go to the ED? yes  Was the patient provided with contact information for the PCP's office or ED? Yes   Was the pt encouraged to call back with questions or concerns? Yes

## 2019-09-09 NOTE — Chronic Care Management (AMB) (Signed)
  Chronic Care Management   Social Work Note  09/09/2019 Name: [name] MRN: [id number] DOB: 1933-06-20  SW received an inbound call from the patients daughter [name] informing SW the patient discharged from [facility] on Saturday November 14. [name] requested SW alert the patients primary provider. Unfortunately, [name] had to cut the call short due to receiving an inbound call from the patients cardiologist.   SW collaborated with the patients primary care team informing of patient return home from rehab. SW will outreach the patient and her daughter over the next week to assist with care coordination needs.   [name], BSW, CDP Social Worker, Certified Dementia Practitioner [place] [facility] [place] [facility] [phone]

## 2019-09-09 NOTE — Telephone Encounter (Signed)
New message    Patients daughter calling to request Coumadin be discontinued.

## 2019-09-09 NOTE — Telephone Encounter (Addendum)
Pt admitted to hospital 08/10/19, discharge summary notes the following:  "Recurrent DVT In the ER had right leg pain (had had a previous fracture of the right ankle).  Found to have a calf DVT.   It was thought that this was from med noncompliance versus medication failure. Given concern for medication failure, she was switched to warfarin with Lovenox bridge.  Now therapeutic INR -Continue warfarin 35 mg/weekly  -Close monitoring INR at SNF  I have equipoise about whether this distal DVT represents treatment failure of Eliquis or not.  Patient deferred to daughter's judgment, and daughter requested to continue warfarin for now, and discuss with Drs. Baird Cancer and Lindi Adie.  I would find it reasonable to transition back to Eliquis or perhaps Xarelto."   Left message for patient's daughter to discuss - if she was non-compliant with Eliquis, would be reasonable to resume if she can improve adherence in the future. If she was already adherent to Eliquis and still developed DVT, would not be the best decision to resume Eliquis therapy.

## 2019-09-10 ENCOUNTER — Other Ambulatory Visit: Payer: Self-pay

## 2019-09-10 ENCOUNTER — Telehealth: Payer: Self-pay

## 2019-09-10 DIAGNOSIS — I110 Hypertensive heart disease with heart failure: Secondary | ICD-10-CM | POA: Diagnosis not present

## 2019-09-10 DIAGNOSIS — I69351 Hemiplegia and hemiparesis following cerebral infarction affecting right dominant side: Secondary | ICD-10-CM | POA: Diagnosis not present

## 2019-09-10 DIAGNOSIS — I69321 Dysphasia following cerebral infarction: Secondary | ICD-10-CM | POA: Diagnosis not present

## 2019-09-10 DIAGNOSIS — I951 Orthostatic hypotension: Secondary | ICD-10-CM | POA: Diagnosis not present

## 2019-09-10 DIAGNOSIS — I82401 Acute embolism and thrombosis of unspecified deep veins of right lower extremity: Secondary | ICD-10-CM | POA: Diagnosis not present

## 2019-09-10 DIAGNOSIS — M4802 Spinal stenosis, cervical region: Secondary | ICD-10-CM | POA: Diagnosis not present

## 2019-09-10 MED ORDER — BD PEN NEEDLE NANO U/F 32G X 4 MM MISC: 2 refills | Status: DC

## 2019-09-10 MED ORDER — APIXABAN 2.5 MG PO TABS: ORAL_TABLET | ORAL | 1 refills | Status: DC

## 2019-09-10 NOTE — Telephone Encounter (Signed)
I spoke with the pt's granddaughter [name] and notified her that [name] is sending Eliqus 2.5 mg to the pharmacy for the pt to take and to stop the coumadin medication.

## 2019-09-10 NOTE — Telephone Encounter (Signed)
I returned a call to Nira Conn with [facility] [phone] and left her a message that [name] has switched the pt from the warfarin back to the Eliquis and that I have notified the pt's granddaughter [name].

## 2019-09-11 DIAGNOSIS — I69351 Hemiplegia and hemiparesis following cerebral infarction affecting right dominant side: Secondary | ICD-10-CM | POA: Diagnosis not present

## 2019-09-11 DIAGNOSIS — I82401 Acute embolism and thrombosis of unspecified deep veins of right lower extremity: Secondary | ICD-10-CM | POA: Diagnosis not present

## 2019-09-11 DIAGNOSIS — I69321 Dysphasia following cerebral infarction: Secondary | ICD-10-CM | POA: Diagnosis not present

## 2019-09-11 DIAGNOSIS — M4802 Spinal stenosis, cervical region: Secondary | ICD-10-CM | POA: Diagnosis not present

## 2019-09-11 DIAGNOSIS — I951 Orthostatic hypotension: Secondary | ICD-10-CM | POA: Diagnosis not present

## 2019-09-11 NOTE — Telephone Encounter (Signed)
Called patient's daughter, [name], to schedule pt for 1 time INR check to transition back to Eliquis 2.5mg  BID (age > 80, SCr > 1.5). Left message for [place].

## 2019-09-11 NOTE — Telephone Encounter (Signed)
Ok to switch back to [employer]. GT

## 2019-09-12 ENCOUNTER — Telehealth: Payer: Self-pay

## 2019-09-12 DIAGNOSIS — I82401 Acute embolism and thrombosis of unspecified deep veins of right lower extremity: Secondary | ICD-10-CM | POA: Diagnosis not present

## 2019-09-12 DIAGNOSIS — M4802 Spinal stenosis, cervical region: Secondary | ICD-10-CM | POA: Diagnosis not present

## 2019-09-12 DIAGNOSIS — I951 Orthostatic hypotension: Secondary | ICD-10-CM | POA: Diagnosis not present

## 2019-09-12 DIAGNOSIS — I69351 Hemiplegia and hemiparesis following cerebral infarction affecting right dominant side: Secondary | ICD-10-CM | POA: Diagnosis not present

## 2019-09-12 DIAGNOSIS — I69321 Dysphasia following cerebral infarction: Secondary | ICD-10-CM | POA: Diagnosis not present

## 2019-09-12 MED ORDER — APIXABAN 2.5 MG PO TABS: 2.5000 mg | ORAL_TABLET | Freq: Two times a day (BID) | ORAL | 5 refills | Status: DC

## 2019-09-12 NOTE — Telephone Encounter (Signed)
The pt was asked if she is still taking Bumetanide because the pharmacy sent a refill request for it.  The pt said no she isn't  using the med any more.

## 2019-09-12 NOTE — Telephone Encounter (Addendum)
Spoke with patient's daughter [name] to advise that pt can change from warfarin back to Eliquis. She states that pt has Psychologist, prison and probation services coming out later this week and they can check her INR.  [facility], they stated they would have RN call us back to take verbal order.  Heather with [facility] returned call to clinic - she is seeing pt tomorrow. Provided verbal order under Dr Lovena Le for her to check patient's INR. She will call result to clinic so that we can provide advice on when pt should start taking her Eliquis. Rx has already been sent to pharmacy of choice.  Pt states her daughter fills her pill box and warfarin is currently in her pill box - this will need to be updated once Eliquis instructions are provided tomorrow (home health RN should be able to update).

## 2019-09-13 ENCOUNTER — Ambulatory Visit: Payer: Self-pay

## 2019-09-13 DIAGNOSIS — R5381 Other malaise: Secondary | ICD-10-CM

## 2019-09-13 DIAGNOSIS — I69351 Hemiplegia and hemiparesis following cerebral infarction affecting right dominant side: Secondary | ICD-10-CM | POA: Diagnosis not present

## 2019-09-13 DIAGNOSIS — E1122 Type 2 diabetes mellitus with diabetic chronic kidney disease: Secondary | ICD-10-CM | POA: Diagnosis not present

## 2019-09-13 DIAGNOSIS — I5032 Chronic diastolic (congestive) heart failure: Secondary | ICD-10-CM | POA: Diagnosis not present

## 2019-09-13 DIAGNOSIS — Z794 Long term (current) use of insulin: Secondary | ICD-10-CM | POA: Diagnosis not present

## 2019-09-13 DIAGNOSIS — I951 Orthostatic hypotension: Secondary | ICD-10-CM | POA: Diagnosis not present

## 2019-09-13 DIAGNOSIS — I69321 Dysphasia following cerebral infarction: Secondary | ICD-10-CM | POA: Diagnosis not present

## 2019-09-13 DIAGNOSIS — M4802 Spinal stenosis, cervical region: Secondary | ICD-10-CM | POA: Diagnosis not present

## 2019-09-13 DIAGNOSIS — N184 Chronic kidney disease, stage 4 (severe): Secondary | ICD-10-CM | POA: Diagnosis not present

## 2019-09-13 DIAGNOSIS — I82401 Acute embolism and thrombosis of unspecified deep veins of right lower extremity: Secondary | ICD-10-CM | POA: Diagnosis not present

## 2019-09-13 NOTE — Patient Instructions (Signed)
Social Worker Visit Information  Goals we discussed today:  Goals Addressed            This Visit's Progress   . "We want to learn about long term care options for placement"       Grand-daughter and daughter stated:  Current Barriers:  . Financial constraints . Level of care concerns . Lacks knowledge of how to apply for long term care Medicaid  Clinical Social Work Clinical Goal(s):  [name] Over the next 45 days, client will follow up with Social Services as directed by SW . New 08/15/2019- Over the next 30 days the patient will follow up with her primary provider regarding necessary documents for a live in caregiver  . New 09/13/2019 - Over the next 60 days the patient will follow up with [facility] regarding January enrollment  CCM SW Interventions: Completed 09/13/2019 with [name] . Outbound call to the patients daughter [name] following recent inpatient discharge . Determined the patient is doing well at home and has been in contact with [facility] regarding enrollment o "she is supposed to start in January" . Discussed plans for the patient to follow up with [facility] intake coordinator regarding needed paperwork . Determined the patient is not quite ready for SNF placement based on Medicaid guidelines and therefore is unable to be placed at this time . Scheduled follow up call over the next month  CCM RN CM Interventions: 08/07/19 call completed with patient's daughter [name]  . Discussed with daughter Ms. Tallman has changed her mind about moving to an ALF . Discussed the patient has been placed on a waiting list for a low income 3 bedroom apartment and will have her granddaughter [name] and her 2 children move in with Ms. Ousley to provide 24/7 care . Discussed Ms. Pangilinan will need a letter from [name] stating Ms. Vora will require a live in caretaker . Discussed Ms. Gao and her family feel that this is a better long term  care plan for her and she feels better about this decision . Message sent to [name] with notification of patient's request . Message sent to [facility] regarding patient's long term care plan . Discussed plans with patient for ongoing care management follow up and provided patient with direct contact information for care management team   Patient Self Care Activities:  . Calls pharmacy for medication refills . Calls provider office for new concerns or questions`  Please see past updates related to this goal by clicking on the "Past Updates" button in the selected goal           Follow Up Plan: SW will follow up with patient by phone over the next month   [name], BSW, CDP Social Worker, Certified Dementia Practitioner [place] [facility] [phone]

## 2019-09-13 NOTE — Chronic Care Management (AMB) (Signed)
Chronic Care Management   Social Work Follow Up Note  09/13/2019 Name: [name] MRN: [id number] DOB: [birth year]  [name] is a 85 y.o. year old female who is a primary care patient of Stephanie Chard, MD. The CCM team was consulted for assistance with care coordination.   Review of patient status, including review of consultants reports, other relevant assessments, and collaboration with appropriate care team members and the patient's provider was performed as part of comprehensive patient evaluation and provision of chronic care management services.    SW placed an outbound call to the patients daughter following recent return home from inpatient and short term rehab stay.  Outpatient Encounter Medications as of 09/13/2019  Medication Sig Note  . acetaminophen (TYLENOL) 500 MG tablet Take 1,000 mg by mouth every 6 (six) hours as needed for mild pain, moderate pain or headache.    [name] amiodarone (PACERONE) 200 MG tablet Take 1 tablet (200 mg total) by mouth daily.   [name] anastrozole (ARIMIDEX) 1 MG tablet Take 1 tablet (1 mg total) by mouth daily.   [name] apixaban (ELIQUIS) 2.5 MG TABS tablet Take 1 tablet (2.5 mg total) by mouth 2 (two) times daily.   [name] atorvastatin (LIPITOR) 20 MG tablet Take 1 tablet (20 mg total) by mouth at bedtime.   . BD PEN NEEDLE NANO U/F 32G X 4 MM MISC Use as directed   . calcium carbonate (OS-CAL) 600 MG TABS tablet Take 600 mg by mouth daily with breakfast.    . carvedilol (COREG) 12.5 MG tablet Take 12.5 mg by mouth 2 (two) times daily with a meal. 08/10/2019: Regimen confirmed to be accurate by the patient  . Cholecalciferol (VITAMIN D-3 PO) Take 1 capsule by mouth daily after breakfast.   . Cyanocobalamin (B-12 PO) Take 1 tablet by mouth daily after breakfast.   . diclofenac sodium (VOLTAREN) 1 % GEL Apply 2 g topically 4 (four) times daily. (Patient taking differently: Apply 2 g topically See admin instructions. Apply 2 grams to knees at bedtime and an  additional 2 grams three times a day as needed for pain)   . ferrous sulfate 325 (65 FE) MG tablet Take 325 mg by mouth daily with breakfast.   . glucose blood (ACCU-CHEK GUIDE) test strip Use as instructed to check blood sugars 3 times per day prn dx: e11.22   . HUMALOG KWIKPEN 200 UNIT/ML SOPN Inject 4-8 Units into the skin 3 (three) times daily. Per sliding scale (Patient taking differently: Inject 4-8 Units into the skin See admin instructions. Inject 4-8 units into the skin three times a day before meals, per sliding scale) 08/10/2019: Regimen confirmed to be accurate by the patient  . insulin detemir (LEVEMIR) 100 UNIT/ML injection Inject 0.18 mLs (18 Units total) into the skin daily.   . isosorbide-hydrALAZINE (BIDIL) 20-37.5 MG tablet Take 1 tablet by mouth 3 (three) times daily.    . magnesium oxide (MAG-OX) 400 MG tablet Take 400 mg by mouth daily.   [name] omeprazole (PRILOSEC) 40 MG capsule TAKE ONE CAPSULE BY MOUTH DAILY BEFORE A MEAL (Patient taking differently: Take 40 mg by mouth 2 (two) times daily before a meal. )   . oxybutynin (DITROPAN) 5 MG tablet Take 1 tablet (5 mg total) by mouth 2 (two) times daily.   . potassium chloride SA (K-DUR) 20 MEQ tablet Take 2 tablets (40 mEq total) by mouth daily.   . pregabalin (LYRICA) 75 MG capsule Take 1 capsule (75 mg total) by  mouth at bedtime.   . senna-docusate (SENOKOT-S) 8.6-50 MG tablet Take 2 tablets by mouth at bedtime as needed for mild constipation.   . torsemide (DEMADEX) 20 MG tablet Take 1 tablet (20 mg total) by mouth 2 (two) times daily.   . traMADol (ULTRAM) 50 MG tablet Take 1 tablet (50 mg total) by mouth 2 (two) times daily as needed.    No facility-administered encounter medications on file as of 09/13/2019.      Goals Addressed            This Visit's Progress   . "We want to learn about long term care options for placement"       Grand-daughter and daughter stated:  Current Barriers:  . Financial constraints .  Level of care concerns . Lacks knowledge of how to apply for long term care Medicaid  Clinical Social Work Clinical Goal(s):  [name] Over the next 45 days, client will follow up with Social Services as directed by SW . New 08/15/2019- Over the next 30 days the patient will follow up with her primary provider regarding necessary documents for a live in caregiver  . New 09/13/2019 - Over the next 60 days the patient will follow up with [facility] regarding January enrollment  CCM SW Interventions: Completed 09/13/2019 with [name] . Outbound call to the patients daughter [name] following recent inpatient discharge . Determined the patient is doing well at home and has been in contact with [facility] regarding enrollment o "she is supposed to start in January" . Discussed plans for the patient to follow up with [facility] intake coordinator regarding needed paperwork . Determined the patient is not quite ready for SNF placement based on Medicaid guidelines and therefore is unable to be placed at this time . Scheduled follow up call over the next month  CCM RN CM Interventions: 08/07/19 call completed with patient's daughter [name]  . Discussed with daughter [name] has changed her mind about moving to an ALF . Discussed the patient has been placed on a waiting list for a low income 3 bedroom apartment and will have her [name] and her 2 children move in with [name] to provide 24/7 care . Discussed [name] will need a letter from [name] stating [name] will require a live in caretaker . Discussed [name] and her family feel that this is a better long term care plan for her and she feels better about this decision . Message sent to [name] with notification of patient's request . Message sent to [name] regarding patient's long term care plan . Discussed plans with patient for ongoing care management follow up  and provided patient with direct contact information for care management team   Patient Self Care Activities:  . Calls pharmacy for medication refills . Calls provider office for new concerns or questions`  Please see past updates related to this goal by clicking on the "Past Updates" button in the selected goal          Follow Up Plan: SW will follow up with the patient over the next month.   [name], BSW, CDP Social Worker, Certified Dementia Practitioner [place] [facility] [phone]  Total time spent performing care coordination and/or care management activities with the patient by phone or face to face = 17 minutes.

## 2019-09-13 NOTE — Telephone Encounter (Addendum)
Spoke with [name] home health RN. INR was 1.3. Will have patient switch to Eliquis tonight.  I called the daughter, [name], she stated she would be able to pick up the Elqiuis tonight. I advised that she remove all coumadin from patient medbox as she will not be taking anymore. Advised that Eliquis is taken twice a day and should be placed in her medbox in the AM and PM.  Daughter in agreement with plan. Start Eliquis tonight

## 2019-09-16 ENCOUNTER — Ambulatory Visit: Payer: Self-pay

## 2019-09-16 DIAGNOSIS — I951 Orthostatic hypotension: Secondary | ICD-10-CM | POA: Diagnosis not present

## 2019-09-16 DIAGNOSIS — I69351 Hemiplegia and hemiparesis following cerebral infarction affecting right dominant side: Secondary | ICD-10-CM | POA: Diagnosis not present

## 2019-09-16 DIAGNOSIS — I82401 Acute embolism and thrombosis of unspecified deep veins of right lower extremity: Secondary | ICD-10-CM | POA: Diagnosis not present

## 2019-09-16 DIAGNOSIS — Z794 Long term (current) use of insulin: Secondary | ICD-10-CM

## 2019-09-16 DIAGNOSIS — R5381 Other malaise: Secondary | ICD-10-CM

## 2019-09-16 DIAGNOSIS — M4802 Spinal stenosis, cervical region: Secondary | ICD-10-CM | POA: Diagnosis not present

## 2019-09-16 DIAGNOSIS — I69321 Dysphasia following cerebral infarction: Secondary | ICD-10-CM | POA: Diagnosis not present

## 2019-09-16 DIAGNOSIS — E1122 Type 2 diabetes mellitus with diabetic chronic kidney disease: Secondary | ICD-10-CM

## 2019-09-16 NOTE — Chronic Care Management (AMB) (Signed)
  Chronic Care Management   Outreach Note  09/16/2019 Name: [name] MRN: [id number] DOB: [birth year]  Referred by: [name], MD Reason for referral : Care Coordination   SW received an inbound call from the patients daughter [name] requesting contact number to [place]: "She has a doctors appointment tomorrow and I need to arrange transportation"  SW reminded Mrs [name] all coordinated rides are to be scheduled 3 business days prior to appointment date. SW provided the contact number as requested to [name] who reports plans to attempt to arrange transportation prior to rescheduling appointment.  Follow Up Plan: SW will follow up with the patient as previously planned.  [name], BSW, CDP Social Worker, Certified Dementia Practitioner [place] [facility] [phone]

## 2019-09-17 ENCOUNTER — Encounter: Payer: Self-pay | Admitting: Nurse Practitioner

## 2019-09-17 ENCOUNTER — Telehealth (INDEPENDENT_AMBULATORY_CARE_PROVIDER_SITE_OTHER): Payer: Medicare Other | Admitting: Nurse Practitioner

## 2019-09-17 ENCOUNTER — Inpatient Hospital Stay: Payer: Medicare Other | Admitting: Nurse Practitioner

## 2019-09-17 ENCOUNTER — Other Ambulatory Visit: Payer: Self-pay

## 2019-09-17 VITALS — BP 109/63 | HR 67 | Temp 98.0°F | Wt 180.3 lb

## 2019-09-17 DIAGNOSIS — I951 Orthostatic hypotension: Secondary | ICD-10-CM | POA: Diagnosis not present

## 2019-09-17 DIAGNOSIS — N184 Chronic kidney disease, stage 4 (severe): Secondary | ICD-10-CM

## 2019-09-17 DIAGNOSIS — R55 Syncope and collapse: Secondary | ICD-10-CM

## 2019-09-17 DIAGNOSIS — Z09 Encounter for follow-up examination after completed treatment for conditions other than malignant neoplasm: Secondary | ICD-10-CM | POA: Diagnosis not present

## 2019-09-17 DIAGNOSIS — I69321 Dysphasia following cerebral infarction: Secondary | ICD-10-CM | POA: Diagnosis not present

## 2019-09-17 DIAGNOSIS — I824Z1 Acute embolism and thrombosis of unspecified deep veins of right distal lower extremity: Secondary | ICD-10-CM | POA: Diagnosis not present

## 2019-09-17 DIAGNOSIS — E1122 Type 2 diabetes mellitus with diabetic chronic kidney disease: Secondary | ICD-10-CM

## 2019-09-17 DIAGNOSIS — I82401 Acute embolism and thrombosis of unspecified deep veins of right lower extremity: Secondary | ICD-10-CM | POA: Diagnosis not present

## 2019-09-17 DIAGNOSIS — M4802 Spinal stenosis, cervical region: Secondary | ICD-10-CM | POA: Diagnosis not present

## 2019-09-17 DIAGNOSIS — I69351 Hemiplegia and hemiparesis following cerebral infarction affecting right dominant side: Secondary | ICD-10-CM | POA: Diagnosis not present

## 2019-09-17 DIAGNOSIS — Z794 Long term (current) use of insulin: Secondary | ICD-10-CM

## 2019-09-17 MED ORDER — ACCU-CHEK GUIDE VI STRP: ORAL_STRIP | 3 refills | Status: DC

## 2019-09-17 MED ORDER — INSULIN DETEMIR 100 UNIT/ML ~~LOC~~ SOLN: SUBCUTANEOUS | Status: DC

## 2019-09-17 NOTE — Progress Notes (Signed)
Virtual Visit via Telephone   This visit type was conducted due to national recommendations for restrictions regarding the COVID-19 Pandemic (e.g. social distancing) in an effort to limit this patient's exposure and mitigate transmission in our community.  Due to her co-morbid illnesses, this patient is at least at moderate risk for complications without adequate follow up.  This format is felt to be most appropriate for this patient at this time.  All issues noted in this document were discussed and addressed.  A limited physical exam was performed with this format.    This visit type was conducted due to national recommendations for restrictions regarding the COVID-19 Pandemic (e.g. social distancing) in an effort to limit this patient's exposure and mitigate transmission in our community.  Patients identity confirmed using two different identifiers.  This format is felt to be most appropriate for this patient at this time.  All issues noted in this document were discussed and addressed.  No physical exam was performed (except for noted visual exam findings with Video Visits).    Date:  09/17/2019   ID:  JASANI LENGEL, DOB Dec 02, 1932, MRN [id number]  Patient Location:  Home - spoke with Ayesha Rumpf  Provider location:   Office    Chief Complaint:  Hospital follow up - syncope and DVT  History of Present Illness:    DRUANNE BOSQUES is a 85 y.o. female who presents via video conferencing for a telehealth visit today.    The patient does not have symptoms concerning for COVID-19 infection (fever, chills, cough, or new shortness of breath).   She is having a telephone visit today for a hospital follow up admission on   She tells me she had a fractured ankle prior to having the blood clot.  She is scheduled to see the orthopedic after Thanksgiving.  She is home now with home physical therapy.  She also has a home [employer]. There was concern about having an increase in weight , she is  unable to stand up by herself due to the pain in her right ankle and knees. She is taking Eliquis.    Diabetes She presents for her follow-up diabetic visit. She has type 2 diabetes mellitus. There are no hypoglycemic associated symptoms. There are no hypoglycemic complications. There are no diabetic complications. Risk factors for coronary artery disease include obesity, sedentary lifestyle, hypertension and diabetes mellitus. She is following a generally unhealthy diet. When asked about meal planning, she reported none. She has not had a previous visit with a dietitian. She rarely participates in exercise. (November 22nd was 63, blood sugar this morning was 165.  Blood sugar was 207 at 110 pm.  )     Past Medical History:  Diagnosis Date   Abnormal echocardiogram    a. possible mass on echo 05/2019 on [facility], b. no evidence of mass / atrial lead vegetation on follow up echo 06/2019   Anemia    Arthritis    Cancer (HCC)    Cataract    Chronic combined systolic and diastolic CHF (congestive heart failure) ([place])    a. Previously diastolic but patient AVS from outside hospital listed systolic CHF and cardiomyopathy, records pending.   CKD (chronic kidney disease), stage III    Clotting disorder (HCC)    CVA (cerebral vascular accident) ([place])    residual right sided weakness and mild dysphagia   Diabetes mellitus ([place])    Dizziness and giddiness    DVT (deep venous thrombosis) ([place])  a. on anticoagulation for this.   Essential hypertension    LBBB (left bundle branch block)    Mitral regurgitation    a. Mod MR by echo 2014.   Muscular deconditioning    Obesity    Pacemaker    Pericardial effusion    SVT (supraventricular tachycardia) ([place])    a. In 2013 she had an EPS with ablation for SVT which did not eliminate the SVT completely. She also had bradycardia which limited medication. She was placed on amiodarone by Dr. Lovena Le.   Thrombocytopenia ([place]) 11/21/2011    Past Surgical History:  Procedure Laterality Date   EYE SURGERY     SUPRAVENTRICULAR TACHYCARDIA ABLATION N/A 10/11/2012   Procedure: SUPRAVENTRICULAR TACHYCARDIA ABLATION;  Surgeon: Evans Lance, MD;  [facility]: [facility] CATH LAB;  Service: Cardiovascular;  Laterality: N/A;     Current Meds  Medication Sig   acetaminophen (TYLENOL) 500 MG tablet Take 1,000 mg by mouth every 6 (six) hours as needed for mild pain, moderate pain or headache.    amiodarone (PACERONE) 200 MG tablet Take 1 tablet (200 mg total) by mouth daily.   anastrozole (ARIMIDEX) 1 MG tablet Take 1 tablet (1 mg total) by mouth daily.   apixaban (ELIQUIS) 2.5 MG TABS tablet Take 1 tablet (2.5 mg total) by mouth 2 (two) times daily.   atorvastatin (LIPITOR) 20 MG tablet Take 1 tablet (20 mg total) by mouth at bedtime.   calcium carbonate (OS-CAL) 600 MG TABS tablet Take 600 mg by mouth daily with breakfast.    carvedilol (COREG) 12.5 MG tablet Take 12.5 mg by mouth 2 (two) times daily with a meal.   Cholecalciferol (VITAMIN D-3 PO) Take 1 capsule by mouth daily after breakfast.   Cyanocobalamin (B-12 PO) Take 1 tablet by mouth daily after breakfast.   diclofenac sodium (VOLTAREN) 1 % GEL Apply 2 g topically 4 (four) times daily. (Patient taking differently: Apply 2 g topically See admin instructions. Apply 2 grams to knees at bedtime and an additional 2 grams three times a day as needed for pain)   ferrous sulfate 325 (65 FE) MG tablet Take 325 mg by mouth daily with breakfast.   glucose blood (ACCU-CHEK GUIDE) test strip Use as instructed to check blood sugars 3 times per day prn dx: e11.22   HUMALOG KWIKPEN 200 UNIT/ML SOPN Inject 4-8 Units into the skin 3 (three) times daily. Per sliding scale (Patient taking differently: Inject 4-8 Units into the skin See admin instructions. Inject 4-8 units into the skin three times a day before meals, per sliding scale)   insulin detemir (LEVEMIR) 100 UNIT/ML injection Inject  0.18 mLs (18 Units total) into the skin daily. (Patient taking differently: Inject 18 Units into the skin daily. Take 16 units in the morning and 10 units in the evening)   isosorbide-hydrALAZINE (BIDIL) 20-37.5 MG tablet Take 1 tablet by mouth 3 (three) times daily.    magnesium oxide (MAG-OX) 400 MG tablet Take 400 mg by mouth daily.   omeprazole (PRILOSEC) 40 MG capsule TAKE ONE CAPSULE BY MOUTH DAILY BEFORE A MEAL (Patient taking differently: Take 40 mg by mouth 2 (two) times daily before a meal. )   oxybutynin (DITROPAN) 5 MG tablet Take 1 tablet (5 mg total) by mouth 2 (two) times daily.   potassium chloride SA (K-DUR) 20 MEQ tablet Take 2 tablets (40 mEq total) by mouth daily.   pregabalin (LYRICA) 75 MG capsule Take 1 capsule (75 mg total) by mouth at bedtime.  senna-docusate (SENOKOT-S) 8.6-50 MG tablet Take 2 tablets by mouth at bedtime as needed for mild constipation.   torsemide (DEMADEX) 20 MG tablet Take 1 tablet (20 mg total) by mouth 2 (two) times daily.   traMADol (ULTRAM) 50 MG tablet Take 1 tablet (50 mg total) by mouth 2 (two) times daily as needed.     Allergies:   Aspirin and Penicillins   Social History   Tobacco Use   Smoking status: Never Smoker   Smokeless tobacco: Never Used  Substance Use Topics   Alcohol use: No   Drug use: No     Family Hx: The patient's family history includes Breast cancer in her maternal aunt; Cancer in her father; Heart attack in her brother; Hypertension in her brother, daughter, and sister; Stroke in her mother and sister.  ROS:   Please see the history of present illness.    Review of Systems  Constitutional: Negative.   Respiratory: Negative.   Cardiovascular: Negative.   Neurological: Negative.   Psychiatric/Behavioral: Negative.     All other systems reviewed and are negative.   Labs/Other Tests and Data Reviewed:    Recent Labs: 07/02/2019: Magnesium 2.3; TSH 2.140 08/10/2019: ALT 41; B Natriuretic  Peptide 138.5 08/22/2019: BUN 40; Creatinine, Ser 1.69; Hemoglobin 8.9; Platelets 112; Potassium 3.5; Sodium 138   Recent Lipid Panel Lab Results  Component Value Date/Time   CHOL 195 08/15/2018 12:36 PM   TRIG 56 01/02/2019 02:25 PM   HDL 81 08/15/2018 12:36 PM   CHOLHDL 2.4 08/15/2018 12:36 PM   CHOLHDL 2.6 12/11/2008 01:30 AM   LDLCALC 96 08/15/2018 12:36 PM    Wt Readings from Last 3 Encounters:  09/17/19 180 lb 4.8 oz (81.8 kg)  08/22/19 179 lb 0.2 oz (81.2 kg)  08/08/19 178 lb 8 oz (81 kg)     Exam:    Vital Signs:  BP 109/63 (BP Location: Left Arm, Patient Position: Sitting, Cuff Size: Small)    Pulse 67    Temp 98 F (36.7 C) (Oral)    Wt 180 lb 4.8 oz (81.8 kg)    BMI 30.00 kg/m     Physical Exam  Constitutional: She is oriented to person, place, and time and well-developed, well-nourished, and in no distress. No distress.  Neurological: She is alert and oriented to person, place, and time.  Psychiatric: Mood, memory, affect and judgment normal.    ASSESSMENT & PLAN:    1. Type 2 diabetes mellitus with stage 4 chronic kidney disease, with long-term current use of insulin (HCC)  Chronic,   She had a syncopal episode that was thought to be related to hypoglycemia  She is now on levemir 16 units am and 10 units pm  Doing well since being home.  Advised to continue to monitor her blood sugars at least 3 times a day - glucose blood (ACCU-CHEK GUIDE) test strip; Use as instructed to check blood sugars 3 times per day prn dx: e11.22  Dispense: 400 each; Refill: 3 - insulin detemir (LEVEMIR) 100 UNIT/ML injection; Take 16 units in the morning and 10 units in the evening.  2. Syncope, unspecified syncope type  Was admitted to the hospital for a syncopal episode thought to be related to hypoglycemia  She is feeling much better now after remaining in the hospital from 10/17-10/29, she then went to [facility] for 3 weeks now she is staying with her daughter TCM  Performed. A member of the clinical team spoke with the patient upon  dischare. Discharge summary was reviewed in full detail during the visit. Meds reconciled and compared to discharge meds. Medication list is updated and reviewed with the patient.  Greater than 50% face to face time was spent in counseling an coordination of care.  All questions were answered to the satisfaction of the patient.    3. Acute deep vein thrombosis (DVT) of distal vein of right lower extremity (HCC)  Found to have a right lower extremity DVT, she is on Eliquis 2.5 mg BID and tolerating well. This was thought to be related to med noncompliance vs medication failure.    COVID-19 Education: The signs and symptoms of COVID-19 were discussed with the patient and how to seek care for testing (follow up with PCP or arrange E-visit).  The importance of social distancing was discussed today.  Patient Risk:   After full review of this patients clinical status, I feel that they are at least moderate risk at this time.  Time:   Today, I have spent  26 minutes/ seconds with the patient with telehealth technology discussing above diagnoses.     Medication Adjustments/Labs and Tests Ordered: Current medicines are reviewed at length with the patient today.  Concerns regarding medicines are outlined above.   Tests Ordered: No orders of the defined types were placed in this encounter.   Medication Changes: No orders of the defined types were placed in this encounter.   Disposition:  Follow up 3 months  Signed, Minette Brine, FNP

## 2019-09-18 DIAGNOSIS — I82401 Acute embolism and thrombosis of unspecified deep veins of right lower extremity: Secondary | ICD-10-CM | POA: Diagnosis not present

## 2019-09-18 DIAGNOSIS — I69351 Hemiplegia and hemiparesis following cerebral infarction affecting right dominant side: Secondary | ICD-10-CM | POA: Diagnosis not present

## 2019-09-18 DIAGNOSIS — I69321 Dysphasia following cerebral infarction: Secondary | ICD-10-CM | POA: Diagnosis not present

## 2019-09-18 DIAGNOSIS — M4802 Spinal stenosis, cervical region: Secondary | ICD-10-CM | POA: Diagnosis not present

## 2019-09-18 DIAGNOSIS — I951 Orthostatic hypotension: Secondary | ICD-10-CM | POA: Diagnosis not present

## 2019-09-23 DIAGNOSIS — I69351 Hemiplegia and hemiparesis following cerebral infarction affecting right dominant side: Secondary | ICD-10-CM | POA: Diagnosis not present

## 2019-09-23 DIAGNOSIS — M4802 Spinal stenosis, cervical region: Secondary | ICD-10-CM | POA: Diagnosis not present

## 2019-09-23 DIAGNOSIS — I82401 Acute embolism and thrombosis of unspecified deep veins of right lower extremity: Secondary | ICD-10-CM | POA: Diagnosis not present

## 2019-09-23 DIAGNOSIS — I951 Orthostatic hypotension: Secondary | ICD-10-CM | POA: Diagnosis not present

## 2019-09-23 DIAGNOSIS — I69321 Dysphasia following cerebral infarction: Secondary | ICD-10-CM | POA: Diagnosis not present

## 2019-09-24 ENCOUNTER — Encounter: Payer: Self-pay | Admitting: Internal Medicine

## 2019-09-24 DIAGNOSIS — I951 Orthostatic hypotension: Secondary | ICD-10-CM | POA: Diagnosis not present

## 2019-09-24 DIAGNOSIS — I69351 Hemiplegia and hemiparesis following cerebral infarction affecting right dominant side: Secondary | ICD-10-CM | POA: Diagnosis not present

## 2019-09-24 DIAGNOSIS — M4802 Spinal stenosis, cervical region: Secondary | ICD-10-CM | POA: Diagnosis not present

## 2019-09-24 DIAGNOSIS — I82401 Acute embolism and thrombosis of unspecified deep veins of right lower extremity: Secondary | ICD-10-CM | POA: Diagnosis not present

## 2019-09-24 DIAGNOSIS — I69321 Dysphasia following cerebral infarction: Secondary | ICD-10-CM | POA: Diagnosis not present

## 2019-09-25 DIAGNOSIS — I69321 Dysphasia following cerebral infarction: Secondary | ICD-10-CM | POA: Diagnosis not present

## 2019-09-25 DIAGNOSIS — I951 Orthostatic hypotension: Secondary | ICD-10-CM | POA: Diagnosis not present

## 2019-09-25 DIAGNOSIS — M4802 Spinal stenosis, cervical region: Secondary | ICD-10-CM | POA: Diagnosis not present

## 2019-09-25 DIAGNOSIS — I69351 Hemiplegia and hemiparesis following cerebral infarction affecting right dominant side: Secondary | ICD-10-CM | POA: Diagnosis not present

## 2019-09-25 DIAGNOSIS — I82401 Acute embolism and thrombosis of unspecified deep veins of right lower extremity: Secondary | ICD-10-CM | POA: Diagnosis not present

## 2019-09-26 DIAGNOSIS — I69351 Hemiplegia and hemiparesis following cerebral infarction affecting right dominant side: Secondary | ICD-10-CM | POA: Diagnosis not present

## 2019-09-26 DIAGNOSIS — I951 Orthostatic hypotension: Secondary | ICD-10-CM | POA: Diagnosis not present

## 2019-09-26 DIAGNOSIS — I69321 Dysphasia following cerebral infarction: Secondary | ICD-10-CM | POA: Diagnosis not present

## 2019-09-26 DIAGNOSIS — M4802 Spinal stenosis, cervical region: Secondary | ICD-10-CM | POA: Diagnosis not present

## 2019-09-26 DIAGNOSIS — I82401 Acute embolism and thrombosis of unspecified deep veins of right lower extremity: Secondary | ICD-10-CM | POA: Diagnosis not present

## 2019-09-30 ENCOUNTER — Telehealth: Payer: Self-pay

## 2019-09-30 DIAGNOSIS — I69351 Hemiplegia and hemiparesis following cerebral infarction affecting right dominant side: Secondary | ICD-10-CM | POA: Diagnosis not present

## 2019-09-30 DIAGNOSIS — I82401 Acute embolism and thrombosis of unspecified deep veins of right lower extremity: Secondary | ICD-10-CM | POA: Diagnosis not present

## 2019-09-30 DIAGNOSIS — M4802 Spinal stenosis, cervical region: Secondary | ICD-10-CM | POA: Diagnosis not present

## 2019-09-30 DIAGNOSIS — I951 Orthostatic hypotension: Secondary | ICD-10-CM | POA: Diagnosis not present

## 2019-09-30 DIAGNOSIS — I69321 Dysphasia following cerebral infarction: Secondary | ICD-10-CM | POA: Diagnosis not present

## 2019-10-01 ENCOUNTER — Ambulatory Visit: Payer: Self-pay

## 2019-10-01 DIAGNOSIS — I130 Hypertensive heart and chronic kidney disease with heart failure and stage 1 through stage 4 chronic kidney disease, or unspecified chronic kidney disease: Secondary | ICD-10-CM

## 2019-10-01 DIAGNOSIS — Z170 Estrogen receptor positive status [ER+]: Secondary | ICD-10-CM

## 2019-10-01 DIAGNOSIS — I69351 Hemiplegia and hemiparesis following cerebral infarction affecting right dominant side: Secondary | ICD-10-CM | POA: Diagnosis not present

## 2019-10-01 DIAGNOSIS — M4802 Spinal stenosis, cervical region: Secondary | ICD-10-CM | POA: Diagnosis not present

## 2019-10-01 DIAGNOSIS — I5032 Chronic diastolic (congestive) heart failure: Secondary | ICD-10-CM

## 2019-10-01 DIAGNOSIS — I82401 Acute embolism and thrombosis of unspecified deep veins of right lower extremity: Secondary | ICD-10-CM | POA: Diagnosis not present

## 2019-10-01 DIAGNOSIS — E1122 Type 2 diabetes mellitus with diabetic chronic kidney disease: Secondary | ICD-10-CM

## 2019-10-01 DIAGNOSIS — C50311 Malignant neoplasm of lower-inner quadrant of right female breast: Secondary | ICD-10-CM

## 2019-10-01 DIAGNOSIS — I951 Orthostatic hypotension: Secondary | ICD-10-CM | POA: Diagnosis not present

## 2019-10-01 DIAGNOSIS — I69321 Dysphasia following cerebral infarction: Secondary | ICD-10-CM | POA: Diagnosis not present

## 2019-10-01 NOTE — Chronic Care Management (AMB) (Signed)
  Chronic Care Management   Outreach Note  10/01/2019 Name: [name] MRN: [id number] DOB: 10-14-1933  Referred by: [name], MD Reason for referral : Chronic Care Management (CCM RNCM Telephone Outreach )   An unsuccessful telephone outreach was attempted today. The patient was referred to the case management team by [name] MD for assistance with care management and care coordination.   Follow Up Plan: Telephone follow up appointment with care management team member scheduled for: 10/15/19  [name], RN, BSN, CCM Care Management Coordinator [place] [facility]  Direct Phone: [phone]

## 2019-10-02 ENCOUNTER — Encounter: Payer: Self-pay | Admitting: Nurse Practitioner

## 2019-10-02 ENCOUNTER — Telehealth: Payer: Self-pay | Admitting: Pharmacist

## 2019-10-03 ENCOUNTER — Ambulatory Visit: Payer: Self-pay

## 2019-10-03 DIAGNOSIS — E1122 Type 2 diabetes mellitus with diabetic chronic kidney disease: Secondary | ICD-10-CM

## 2019-10-03 NOTE — Chronic Care Management (AMB) (Signed)
  Chronic Care Management   Outreach Note  10/03/2019 Name: [name] MRN: [id number] DOB: [birth year]  Referred by: [name], MD Reason for referral : Care Coordination   SW placed an outbound call to the patients daughter [name] after receiving a voice message requesting a call. Unfortunately, Mrs [name] reported she was unable to complete the call at the time SW returned her call and would return SW call at a later time.  Follow Up Plan: SW will await return call and follow up with the patient as previously scheduled.  [name], BSW, CDP Social Worker, Certified Dementia Practitioner [place] [facility] [place] [facility] [phone]

## 2019-10-06 ENCOUNTER — Other Ambulatory Visit: Payer: Self-pay | Admitting: Cardiology

## 2019-10-07 DIAGNOSIS — I69351 Hemiplegia and hemiparesis following cerebral infarction affecting right dominant side: Secondary | ICD-10-CM | POA: Diagnosis not present

## 2019-10-07 DIAGNOSIS — I82401 Acute embolism and thrombosis of unspecified deep veins of right lower extremity: Secondary | ICD-10-CM | POA: Diagnosis not present

## 2019-10-07 DIAGNOSIS — M4802 Spinal stenosis, cervical region: Secondary | ICD-10-CM | POA: Diagnosis not present

## 2019-10-07 DIAGNOSIS — I951 Orthostatic hypotension: Secondary | ICD-10-CM | POA: Diagnosis not present

## 2019-10-07 DIAGNOSIS — I69321 Dysphasia following cerebral infarction: Secondary | ICD-10-CM | POA: Diagnosis not present

## 2019-10-08 ENCOUNTER — Telehealth: Payer: Self-pay | Admitting: *Deleted

## 2019-10-08 DIAGNOSIS — I951 Orthostatic hypotension: Secondary | ICD-10-CM | POA: Diagnosis not present

## 2019-10-08 DIAGNOSIS — I69351 Hemiplegia and hemiparesis following cerebral infarction affecting right dominant side: Secondary | ICD-10-CM | POA: Diagnosis not present

## 2019-10-08 DIAGNOSIS — M4802 Spinal stenosis, cervical region: Secondary | ICD-10-CM | POA: Diagnosis not present

## 2019-10-08 DIAGNOSIS — I69321 Dysphasia following cerebral infarction: Secondary | ICD-10-CM | POA: Diagnosis not present

## 2019-10-08 DIAGNOSIS — I82401 Acute embolism and thrombosis of unspecified deep veins of right lower extremity: Secondary | ICD-10-CM | POA: Diagnosis not present

## 2019-10-08 NOTE — Telephone Encounter (Signed)
Received call from pt daughter [name] requesting follow up with MD.  States pt has not seen surgeon and is concerned.  RN placed call to [facility] and confirmed an apt was made for 11/05/2018 at 11:15 am.  RN informed pt daughter [name] that pt currently has an apt with [facility] and gave her the date, time, phone number and address.  RN also scheduled pt f/u up Dr. Lissa Morales the following week. [name] verbalized understanding and appreciative of the help.

## 2019-10-10 DIAGNOSIS — I110 Hypertensive heart disease with heart failure: Secondary | ICD-10-CM | POA: Diagnosis not present

## 2019-10-15 ENCOUNTER — Telehealth: Payer: Self-pay

## 2019-10-15 DIAGNOSIS — I82401 Acute embolism and thrombosis of unspecified deep veins of right lower extremity: Secondary | ICD-10-CM | POA: Diagnosis not present

## 2019-10-15 DIAGNOSIS — I951 Orthostatic hypotension: Secondary | ICD-10-CM | POA: Diagnosis not present

## 2019-10-15 DIAGNOSIS — M4802 Spinal stenosis, cervical region: Secondary | ICD-10-CM | POA: Diagnosis not present

## 2019-10-15 DIAGNOSIS — I69351 Hemiplegia and hemiparesis following cerebral infarction affecting right dominant side: Secondary | ICD-10-CM | POA: Diagnosis not present

## 2019-10-15 DIAGNOSIS — I69321 Dysphasia following cerebral infarction: Secondary | ICD-10-CM | POA: Diagnosis not present

## 2019-10-22 ENCOUNTER — Ambulatory Visit: Payer: Self-pay

## 2019-10-22 DIAGNOSIS — I130 Hypertensive heart and chronic kidney disease with heart failure and stage 1 through stage 4 chronic kidney disease, or unspecified chronic kidney disease: Secondary | ICD-10-CM

## 2019-10-22 DIAGNOSIS — E1122 Type 2 diabetes mellitus with diabetic chronic kidney disease: Secondary | ICD-10-CM

## 2019-10-22 DIAGNOSIS — Z794 Long term (current) use of insulin: Secondary | ICD-10-CM

## 2019-10-22 NOTE — Chronic Care Management (AMB) (Signed)
Chronic Care Management    Social Work Follow Up Note  10/22/2019 Name: [name] MRN: [id number] DOB: 05/18/1933  [name] is a 85 y.o. year old female who is a primary care patient of Stephanie Chard, MD. The CCM team was consulted for assistance with care coordination.   Review of patient status, including review of consultants reports, other relevant assessments, and collaboration with appropriate care team members and the patient's provider was performed as part of comprehensive patient evaluation and provision of chronic care management services.    SW placed an outbound call to the patients daughter and caregiver [name] to assist with care coordination needs.  Outpatient Encounter Medications as of 10/22/2019  Medication Sig Note  . acetaminophen (TYLENOL) 500 MG tablet Take 1,000 mg by mouth every 6 (six) hours as needed for mild pain, moderate pain or headache.    [name] amiodarone (PACERONE) 200 MG tablet Take 1 tablet (200 mg total) by mouth daily.   [name] anastrozole (ARIMIDEX) 1 MG tablet Take 1 tablet (1 mg total) by mouth daily.   [name] apixaban (ELIQUIS) 2.5 MG TABS tablet Take 1 tablet (2.5 mg total) by mouth 2 (two) times daily.   [name] atorvastatin (LIPITOR) 20 MG tablet Take 1 tablet (20 mg total) by mouth at bedtime.   . BD PEN NEEDLE NANO U/F 32G X 4 MM MISC Use as directed   . calcium carbonate (OS-CAL) 600 MG TABS tablet Take 600 mg by mouth daily with breakfast.    . carvedilol (COREG) 12.5 MG tablet TAKE 1 TABLET(12.5 MG) BY MOUTH TWICE DAILY   . Cholecalciferol (VITAMIN D-3 PO) Take 1 capsule by mouth daily after breakfast.   . Cyanocobalamin (B-12 PO) Take 1 tablet by mouth daily after breakfast.   . diclofenac sodium (VOLTAREN) 1 % GEL Apply 2 g topically 4 (four) times daily. (Patient taking differently: Apply 2 g topically See admin instructions. Apply 2 grams to knees at bedtime and an additional 2 grams three times a day as needed for pain)   . ferrous  sulfate 325 (65 FE) MG tablet Take 325 mg by mouth daily with breakfast.   . glucose blood (ACCU-CHEK GUIDE) test strip Use as instructed to check blood sugars 3 times per day prn dx: e11.22   . HUMALOG KWIKPEN 200 UNIT/ML SOPN Inject 4-8 Units into the skin 3 (three) times daily. Per sliding scale (Patient taking differently: Inject 4-8 Units into the skin See admin instructions. Inject 4-8 units into the skin three times a day before meals, per sliding scale) 08/10/2019: Regimen confirmed to be accurate by the patient  . insulin detemir (LEVEMIR) 100 UNIT/ML injection Take 16 units in the morning and 10 units in the evening.   . isosorbide-hydrALAZINE (BIDIL) 20-37.5 MG tablet Take 1 tablet by mouth 3 (three) times daily.    . magnesium oxide (MAG-OX) 400 MG tablet Take 400 mg by mouth daily.   [name] omeprazole (PRILOSEC) 40 MG capsule TAKE ONE CAPSULE BY MOUTH DAILY BEFORE A MEAL (Patient taking differently: Take 40 mg by mouth 2 (two) times daily before a meal. )   . oxybutynin (DITROPAN) 5 MG tablet Take 1 tablet (5 mg total) by mouth 2 (two) times daily.   . potassium chloride SA (K-DUR) 20 MEQ tablet Take 2 tablets (40 mEq total) by mouth daily.   . pregabalin (LYRICA) 75 MG capsule Take 1 capsule (75 mg total) by mouth at bedtime.   . senna-docusate (SENOKOT-S) 8.6-50 MG  tablet Take 2 tablets by mouth at bedtime as needed for mild constipation.   . torsemide (DEMADEX) 20 MG tablet Take 1 tablet (20 mg total) by mouth 2 (two) times daily.   . traMADol (ULTRAM) 50 MG tablet Take 1 tablet (50 mg total) by mouth 2 (two) times daily as needed.    No facility-administered encounter medications on file as of 10/22/2019.     Goals Addressed            This Visit's Progress   . COMPLETED: "We want to learn about long term care options for placement"       Grand-daughter and daughter stated:  Current Barriers:  . Financial constraints . Level of care concerns . Lacks knowledge of how to apply  for long term care Medicaid  Clinical Social Work Clinical Goal(s):  [name] Over the next 45 days, client will follow up with Social Services as directed by SW . New 08/15/2019- Over the next 30 days the patient will follow up with her primary provider regarding necessary documents for a live in caregiver  . New 09/13/2019 - Over the next 60 days the patient will follow up with [facility] regarding January enrollment  CCM SW Interventions: Completed 10/22/2019 with [name] . Outbound call to the patients daughter [name] to review patient planned transition into [facility] o [name] reports the patient is no longer enrolling due to assets that would cause family to pay privately for services for several months until qualifying for LTC Medicaid . Discussed plans for the patient to remain in the home with her daughter and extended family who will assist with care needs o Determined [name] is in need of a letter from the patients physician indicating the patient requires a live in caregiver to submit to [place] [employer] o Collaboration with provider office regarding letter needs o Advised [name] to contact the patients provider office over the next 10 days to follow up on letter completion due to the patients provider being out of the office the rest of the week   CCM RN CM Interventions: 08/07/19 call completed with patient's daughter [name]  . Discussed with daughter Ms. [name] has changed her mind about moving to an ALF . Discussed the patient has been placed on a waiting list for a low income 3 bedroom apartment and will have her [name] and her 2 children move in with [name] to provide 24/7 care . Discussed [name] will need a letter from [name] stating [name] will require a live in caretaker . Discussed [name] and her family feel that this is a better long term care plan for her and she feels better about this  decision . Message sent to [name] with notification of patient's request . Message sent to [facility] regarding patient's long term care plan . Discussed plans with patient for ongoing care management follow up and provided patient with direct contact information for care management team   Patient Self Care Activities:  . Calls pharmacy for medication refills . Calls provider office for new concerns or questions`  Please see past updates related to this goal by clicking on the "Past Updates" button in the selected goal          Follow Up Plan: SW will follow up with patient by phone over the next month.   [name], BSW, CDP Social Worker, Teacher, adult education /  [facility] Care Management [phone]

## 2019-10-29 ENCOUNTER — Encounter: Payer: Self-pay | Admitting: Internal Medicine

## 2019-10-29 ENCOUNTER — Ambulatory Visit: Payer: Self-pay

## 2019-10-29 DIAGNOSIS — E1122 Type 2 diabetes mellitus with diabetic chronic kidney disease: Secondary | ICD-10-CM

## 2019-10-29 DIAGNOSIS — I130 Hypertensive heart and chronic kidney disease with heart failure and stage 1 through stage 4 chronic kidney disease, or unspecified chronic kidney disease: Secondary | ICD-10-CM

## 2019-10-29 NOTE — Chronic Care Management (AMB) (Signed)
  Chronic Care Management   Outreach Note  10/29/2019 Name: [name] MRN: [id number] DOB: 01-23-1933  Referred by: [name], MD Reason for referral : Care Coordination   SW returned a call to the patients daughter [name] after receiving a voice message requesting assistance. [name] requested assistance in determining if the patients letter for the [employer] is ready for pick up. SW collaborated with the patients provider office and determined the letter would be ready for pick up by 2:00 pm 10/28/2018. SW contacted [name] to convey the message.  [name], BSW, CDP Social Worker, Certified Dementia Practitioner [place] / [place] [facility] [phone]

## 2019-11-02 ENCOUNTER — Other Ambulatory Visit: Payer: Self-pay | Admitting: Physician Assistant

## 2019-11-04 ENCOUNTER — Other Ambulatory Visit: Payer: Self-pay

## 2019-11-04 ENCOUNTER — Telehealth: Payer: Self-pay

## 2019-11-04 ENCOUNTER — Ambulatory Visit: Payer: Self-pay

## 2019-11-04 DIAGNOSIS — E1122 Type 2 diabetes mellitus with diabetic chronic kidney disease: Secondary | ICD-10-CM

## 2019-11-04 DIAGNOSIS — Z794 Long term (current) use of insulin: Secondary | ICD-10-CM

## 2019-11-04 NOTE — Chronic Care Management (AMB) (Signed)
  Chronic Care Management   Outreach Note  11/04/2019 Name: TOMIE SPIZZIRRI MRN: [id number] DOB: [birth year]  Referred by: [name], MD Reason for referral : Care Coordination   SW placed an unsuccessful outbound call to the patients daughter and caregiver [name] to assist with care coordination needs. SW left a HIPAA compliant voice message requesting a return call.  Follow Up Plan: SW will follow up with the patient and her daughter over the next 3 weeks.  [name], BSW, CDP Social Worker, Certified Dementia Practitioner [place] / [place] [facility] [phone]

## 2019-11-06 DIAGNOSIS — R6889 Other general symptoms and signs: Secondary | ICD-10-CM | POA: Diagnosis not present

## 2019-11-06 DIAGNOSIS — C50911 Malignant neoplasm of unspecified site of right female breast: Secondary | ICD-10-CM | POA: Diagnosis not present

## 2019-11-11 ENCOUNTER — Ambulatory Visit: Payer: Medicare Other | Admitting: Podiatry

## 2019-11-11 ENCOUNTER — Telehealth: Payer: Self-pay | Admitting: Interventional Cardiology

## 2019-11-11 MED ORDER — TORSEMIDE 20 MG PO TABS: 20.0000 mg | ORAL_TABLET | Freq: Two times a day (BID) | ORAL | 2 refills | Status: DC

## 2019-11-11 MED ORDER — BIDIL 20-37.5 MG PO TABS: 1.0000 | ORAL_TABLET | Freq: Three times a day (TID) | ORAL | 2 refills | Status: DC

## 2019-11-11 NOTE — Telephone Encounter (Signed)
*  STAT* If patient is at the pharmacy, call can be transferred to refill team.   1. Which medications need to be refilled? (please list name of each medication and dose if known) isosorbide-hydrALAZINE (BIDIL) 20-37.5 MG tablet  torsemide (DEMADEX) 20 MG tablet   2. Which pharmacy/location (including street and city if local pharmacy) is medication to be sent to?  [id number] - [place], [place] - [address] AT [facility] OF [address]  3. Do they need a 30 day or 90 day supply? 90 day

## 2019-11-11 NOTE — Progress Notes (Signed)
Patient Care Team: [name], MD as PCP - General Irish Lack, Charlann Lange, MD as PCP - Cardiology (Cardiology) Excell Seltzer, MD (Inactive) as Consulting Physician (General Surgery) Nicholas Lose, MD as Consulting Physician (Hematology and Oncology) Kyung Rudd, MD as Consulting Physician (Radiation Oncology) [name] as Social Worker Little, Claudette Stapler, RN as Case Manager Lavera Guise, [facility] (Pharmacist)  DIAGNOSIS:    ICD-10-CM   1. Malignant neoplasm of lower-inner quadrant of right breast of female, estrogen receptor positive ([place])  C50.311    Z17.0     SUMMARY OF ONCOLOGIC HISTORY: Oncology History  Malignant neoplasm of lower-inner quadrant of right breast of female, estrogen receptor positive ([place])  11/19/2018 Initial Diagnosis   Palpable right breast mass with calcifications, mammogram revealed focal asymmetry lower right breast 5:00 measuring 4.6 cm, ultrasound revealed overall size of 5.7 cm ultrasound-guided biopsy revealed grade 2 IDC ER 90% PR 80% Ki-67 5%, HER-2 negative, T3N0 stage IIa clinical stage   11/28/2018 Cancer Staging   Staging form: Breast, AJCC 8th Edition - Clinical: Stage IIA (cT3, cN0, cM0, G2, ER+, PR+, HER2-) - Signed by Nicholas Lose, MD on 11/28/2018     CHIEF COMPLIANT: Follow-up of right breast cancer on neoadjuvant anastrozole  INTERVAL HISTORY: MANALI MCELMURRY is a 86 y.o. with above-mentioned history of right breast cancer currently on neoadjuvant treatment with anastrozole. She was admitted from 08/10/19-08/22/19 after presenting to the ED for syncope and a right calf DVT and was switched to warfarin. Mammogram and [place] on 09/06/19 showed significant improvement in the right breast mass from 4.6cm to 0.5cm. She presents to the clinic today for follow-up.   ALLERGIES:  is allergic to aspirin and penicillins.  MEDICATIONS:  Current Outpatient Medications  Medication Sig Dispense Refill  . acetaminophen (TYLENOL) 500 MG tablet Take 1,000  mg by mouth every 6 (six) hours as needed for mild pain, moderate pain or headache.     [name] amiodarone (PACERONE) 200 MG tablet Take 1 tablet (200 mg total) by mouth daily. 90 tablet 2  . anastrozole (ARIMIDEX) 1 MG tablet Take 1 tablet (1 mg total) by mouth daily. 90 tablet 3  . apixaban (ELIQUIS) 2.5 MG TABS tablet Take 1 tablet (2.5 mg total) by mouth 2 (two) times daily. 60 tablet 5  . atorvastatin (LIPITOR) 20 MG tablet Take 1 tablet (20 mg total) by mouth at bedtime. 90 tablet 2  . BD PEN NEEDLE NANO U/F 32G X 4 MM MISC Use as directed 150 each 2  . calcium carbonate (OS-CAL) 600 MG TABS tablet Take 600 mg by mouth daily with breakfast.     . carvedilol (COREG) 12.5 MG tablet TAKE 1 TABLET(12.5 MG) BY MOUTH TWICE DAILY 180 tablet 2  . Cholecalciferol (VITAMIN D-3 PO) Take 1 capsule by mouth daily after breakfast.    . Cyanocobalamin (B-12 PO) Take 1 tablet by mouth daily after breakfast.    . diclofenac sodium (VOLTAREN) 1 % GEL Apply 2 g topically 4 (four) times daily. (Patient taking differently: Apply 2 g topically See admin instructions. Apply 2 grams to knees at bedtime and an additional 2 grams three times a day as needed for pain) 100 g 1  . ferrous sulfate 325 (65 FE) MG tablet Take 325 mg by mouth daily with breakfast.    . glucose blood (ACCU-CHEK GUIDE) test strip Use as instructed to check blood sugars 3 times per day prn dx: e11.22 400 each 3  . HUMALOG KWIKPEN 200 UNIT/ML SOPN  Inject 4-8 Units into the skin 3 (three) times daily. Per sliding scale (Patient taking differently: Inject 4-8 Units into the skin See admin instructions. Inject 4-8 units into the skin three times a day before meals, per sliding scale) 15 pen 1  . insulin detemir (LEVEMIR) 100 UNIT/ML injection Take 16 units in the morning and 10 units in the evening.    . isosorbide-hydrALAZINE (BIDIL) 20-37.5 MG tablet Take 1 tablet by mouth 3 (three) times daily. 90 tablet 2  . magnesium oxide (MAG-OX) 400 MG tablet Take  400 mg by mouth daily.    [name] omeprazole (PRILOSEC) 40 MG capsule TAKE ONE CAPSULE BY MOUTH DAILY BEFORE A MEAL (Patient taking differently: Take 40 mg by mouth 2 (two) times daily before a meal. ) 90 capsule 1  . oxybutynin (DITROPAN) 5 MG tablet Take 1 tablet (5 mg total) by mouth 2 (two) times daily. 180 tablet 1  . potassium chloride SA (K-DUR) 20 MEQ tablet Take 2 tablets (40 mEq total) by mouth daily. 180 tablet 1  . pregabalin (LYRICA) 75 MG capsule Take 1 capsule (75 mg total) by mouth at bedtime. 90 capsule 2  . senna-docusate (SENOKOT-S) 8.6-50 MG tablet Take 2 tablets by mouth at bedtime as needed for mild constipation.    . torsemide (DEMADEX) 20 MG tablet Take 1 tablet (20 mg total) by mouth 2 (two) times daily. 180 tablet 2  . traMADol (ULTRAM) 50 MG tablet Take 1 tablet (50 mg total) by mouth 2 (two) times daily as needed. 12 tablet 0   No current facility-administered medications for this visit.    PHYSICAL EXAMINATION: ECOG PERFORMANCE STATUS: 1 - Symptomatic but completely ambulatory  There were no vitals filed for this visit. There were no vitals filed for this visit.  LABORATORY DATA:  I have reviewed the data as listed CMP Latest Ref Rng & Units 08/22/2019 08/20/2019 08/19/2019  Glucose 70 - 99 mg/dL 70 243(H) 65(L)  BUN 8 - 23 mg/dL 40(H) 41(H) 44(H)  Creatinine 0.44 - 1.00 mg/dL 1.69(H) 1.74(H) 2.06(H)  Sodium 135 - 145 mmol/L 138 138 142  Potassium 3.5 - 5.1 mmol/L 3.5 3.8 3.4(L)  Chloride 98 - 111 mmol/L 106 103 104  CO2 22 - 32 mmol/L '24 24 24  ' Calcium 8.9 - 10.3 mg/dL 8.1(L) 8.7(L) 8.5(L)  Total Protein 6.5 - 8.1 g/dL - - -  Total Bilirubin 0.3 - 1.2 mg/dL - - -  Alkaline Phos 38 - 126 U/L - - -  AST 15 - 41 U/L - - -  ALT 0 - 44 U/L - - -    Lab Results  Component Value Date   WBC 2.6 (L) 08/22/2019   HGB 8.9 (L) 08/22/2019   HCT 27.8 (L) 08/22/2019   MCV 95.2 08/22/2019   PLT 112 (L) 08/22/2019   NEUTROABS 1.2 (L) 06/08/2019    ASSESSMENT &  PLAN:  Malignant neoplasm of lower-inner quadrant of right breast of female, estrogen receptor positive ([place]) 11/19/2018:Palpable right breast mass with calcifications, mammogram revealed focal asymmetry lower right breast 5:00 measuring 4.6 cm, ultrasound revealed overall size of 5.7 cm ultrasound-guided biopsy revealed grade 2 IDC ER 90% PR 80% Ki-67 5%, HER-2 negative, T3N0 stage IIa clinical stage  Treatment plan: 1.Neoadjuvant antiestrogen therapy with anastrozole started 11/28/2018 2.  Patient went to [facility] and they felt that she could undergo surgery.  She was seen by Dr. Donne Hazel recently.    Mammogram and ultrasound 09/06/2019: Decrease in size of the  mass from 4.6 cm to 0.5 cm, no axillary lymph nodes We discussed different options and patient is leaning towards watchful monitoring on hormonal therapy  Anastrozole toxicities: hair loss  RTC in 6 months with Bil mammograms and ultrasound of Rt breast  No orders of the defined types were placed in this encounter.  The patient has a good understanding of the overall plan. she agrees with it. she will call with any problems that may develop before the next visit here.  Total time spent: 15 mins including face to face time and time spent for planning, charting and coordination of care  Nicholas Lose, MD 11/12/2019  I, Cloyde Reams Dorshimer, am acting as scribe for Dr. Nicholas Lose.  I have reviewed the above documentation for accuracy and completeness, and I agree with the above.

## 2019-11-11 NOTE — Telephone Encounter (Signed)
Pt's medications were sent to pt's pharmacy as requested. Confirmation received.  

## 2019-11-12 ENCOUNTER — Other Ambulatory Visit: Payer: Self-pay

## 2019-11-12 ENCOUNTER — Inpatient Hospital Stay: Payer: Medicare Other | Attending: Hematology and Oncology | Admitting: Hematology and Oncology

## 2019-11-12 DIAGNOSIS — Z79899 Other long term (current) drug therapy: Secondary | ICD-10-CM | POA: Diagnosis not present

## 2019-11-12 DIAGNOSIS — C50311 Malignant neoplasm of lower-inner quadrant of right female breast: Secondary | ICD-10-CM | POA: Insufficient documentation

## 2019-11-12 DIAGNOSIS — Z170 Estrogen receptor positive status [ER+]: Secondary | ICD-10-CM | POA: Diagnosis not present

## 2019-11-12 DIAGNOSIS — Z794 Long term (current) use of insulin: Secondary | ICD-10-CM | POA: Diagnosis not present

## 2019-11-12 MED ORDER — ANASTROZOLE 1 MG PO TABS: 1.0000 mg | ORAL_TABLET | Freq: Every day | ORAL | 3 refills | Status: DC

## 2019-11-12 NOTE — Assessment & Plan Note (Signed)
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  Patient went to [facility] and they felt that she could undergo surgery.  She was seen by Dr. Donne Hazel recently.  Plan is to undergo breast conserving surgery 3.  Followed by radiation 4.  Followed by antiestrogen therapy  Mammogram and ultrasound 09/06/2019: Decrease in size of the mass from 4.6 cm to 0.5 cm, no axillary lymph nodes Return to clinic after surgery to discuss pathology report.

## 2019-11-13 ENCOUNTER — Telehealth: Payer: Self-pay | Admitting: Hematology and Oncology

## 2019-11-13 ENCOUNTER — Telehealth: Payer: Self-pay | Admitting: *Deleted

## 2019-11-13 NOTE — Telephone Encounter (Signed)
Will do anticoagulation assessment if needed in the future

## 2019-11-13 NOTE — Telephone Encounter (Signed)
   Primary Cardiologist: Larae Grooms, MD  Chart reviewed as part of pre-operative protocol coverage. Patient was contacted 11/13/2019 in reference to pre-operative risk assessment for pending surgery as outlined below. Her daughter states that [name] went to see oncology yesterday, Dr. Lindi Adie, and it was decided that they would rely on medication for now and defer surgery at this time.   According to the RCRI and history of CKD, CHF, DM and stroke as well as advanced age, this pt is at least moderate risk for surgery. I think that trying conservative measures when possible is wise. Please reach out to our office again if the plan should change.   I will route this recommendation to the requesting party via Epic fax function and remove from pre-op pool.  Please call with questions.  Daune Perch, NP 11/13/2019, 1:03 PM

## 2019-11-13 NOTE — Telephone Encounter (Signed)
I talk with patient regarding schedule  

## 2019-11-13 NOTE — Telephone Encounter (Signed)
   [facility] Pre-operative Risk Assessment    Request for surgical clearance:  1. What type of surgery is being performed? BREAST CANCER SURGERY (LOOKS LIKE LUMPECTOMY AT THIS TIME)   2. When is this surgery scheduled? TBD   3. What type of clearance is required (medical clearance vs. Pharmacy clearance to hold med vs. Both)? BOTH  4. Are there any medications that need to be held prior to surgery and how long? ELIQUIS   5. Practice name and name of physician performing surgery? [facility]; DR. Rodman [place]   6. What is your office phone number [phone]    7.   What is your office fax number [phone] ATTN: Illene Regulus, CMA  8.   Anesthesia type (None, local, MAC, general) ? GENERAL   [name] 11/13/2019, 11:51 AM  _________________________________________________________________   (provider comments below)

## 2019-11-18 ENCOUNTER — Telehealth: Payer: Self-pay | Admitting: Pharmacist

## 2019-11-26 ENCOUNTER — Telehealth: Payer: Self-pay

## 2019-11-26 ENCOUNTER — Ambulatory Visit: Payer: Self-pay

## 2019-11-26 DIAGNOSIS — Z794 Long term (current) use of insulin: Secondary | ICD-10-CM

## 2019-11-26 DIAGNOSIS — E1122 Type 2 diabetes mellitus with diabetic chronic kidney disease: Secondary | ICD-10-CM

## 2019-11-26 NOTE — Chronic Care Management (AMB) (Signed)
  Chronic Care Management   Outreach Note  11/26/2019 Name: [name] MRN: [id number] DOB: 15-Sep-1933  Referred by: [name], MD Reason for referral : Care Coordination   SW placed a second unsuccessful outbound call to assist with care coordination needs. SW left a HIPAA compliant voice message requesting a return call.  Follow Up Plan: The care management team will reach out to the patient again over the next 14 days.   [name], BSW, CDP Social Worker, Certified Dementia Practitioner [place] [facility] [place] [phone]

## 2019-11-28 ENCOUNTER — Encounter: Payer: Self-pay | Admitting: Internal Medicine

## 2019-11-28 ENCOUNTER — Ambulatory Visit (INDEPENDENT_AMBULATORY_CARE_PROVIDER_SITE_OTHER): Payer: Medicare Other | Admitting: Internal Medicine

## 2019-11-28 ENCOUNTER — Other Ambulatory Visit: Payer: Self-pay

## 2019-11-28 VITALS — BP 130/78 | HR 76 | Temp 98.3°F | Ht 65.0 in | Wt 187.0 lb

## 2019-11-28 DIAGNOSIS — N184 Chronic kidney disease, stage 4 (severe): Secondary | ICD-10-CM | POA: Diagnosis not present

## 2019-11-28 DIAGNOSIS — E1122 Type 2 diabetes mellitus with diabetic chronic kidney disease: Secondary | ICD-10-CM | POA: Diagnosis not present

## 2019-11-28 DIAGNOSIS — R300 Dysuria: Secondary | ICD-10-CM

## 2019-11-28 DIAGNOSIS — Z794 Long term (current) use of insulin: Secondary | ICD-10-CM | POA: Diagnosis not present

## 2019-11-28 DIAGNOSIS — N390 Urinary tract infection, site not specified: Secondary | ICD-10-CM

## 2019-11-28 LAB — POCT URINALYSIS DIPSTICK
Bilirubin, UA: NEGATIVE
Blood, UA: NEGATIVE
Glucose, UA: NEGATIVE
Ketones, UA: NEGATIVE
Nitrite, UA: POSITIVE
Protein, UA: NEGATIVE
Spec Grav, UA: 1.015 (ref 1.010–1.025)
Urobilinogen, UA: 0.2 E.U./dL
pH, UA: 5 (ref 5.0–8.0)

## 2019-11-28 MED ORDER — CIPROFLOXACIN HCL 250 MG PO TABS: 250.0000 mg | ORAL_TABLET | Freq: Two times a day (BID) | ORAL | 0 refills | Status: DC

## 2019-11-28 MED ORDER — PHENAZOPYRIDINE HCL 200 MG PO TABS: ORAL_TABLET | ORAL | 0 refills | Status: DC

## 2019-11-28 NOTE — Progress Notes (Signed)
This visit occurred during the SARS-CoV-2 public health emergency.  Safety protocols were in place, including screening questions prior to the visit, additional usage of staff PPE, and extensive cleaning of exam room while observing appropriate contact time as indicated for disinfecting solutions.  Subjective:     Patient ID: [name] , female    DOB: [birth year] , 86 y.o.   MRN: [id number]   Chief Complaint  Patient presents with  . Urinary Tract Infection    HPI Onset of Dysuria x 1.5 weeks ago, and she could not get in to be seen til now. Denies blood in urine, fever, or flank pain.   Past Medical History:  Diagnosis Date  . Abnormal echocardiogram    a. possible mass on echo 05/2019 on PPM, b. no evidence of mass / atrial lead vegetation on follow up echo 06/2019  . Anemia   . Arthritis   . Cancer ([place])   . Cataract   . Chronic combined systolic and diastolic CHF (congestive heart failure) ([place])    a. Previously diastolic but patient AVS from outside hospital listed systolic CHF and cardiomyopathy, records pending.  . CKD (chronic kidney disease), stage III   . Clotting disorder ([place])   . CVA (cerebral vascular accident) ([place])    residual right sided weakness and mild dysphagia  . Diabetes mellitus ([place])   . Dizziness and giddiness   . DVT (deep venous thrombosis) ([facility])    a. on anticoagulation for this.  . Essential hypertension   . LBBB (left bundle branch block)   . Mitral regurgitation    a. Mod MR by echo 2014.  . Muscular deconditioning   . Obesity   . Pacemaker   . Pericardial effusion   . SVT (supraventricular tachycardia) ([place])    a. In 2013 she had an EPS with ablation for SVT which did not eliminate the SVT completely. She also had bradycardia which limited medication. She was placed on amiodarone by Dr. Lovena Le.  . Thrombocytopenia ([place]) 11/21/2011     Family History  Problem Relation Age of Onset  . Stroke Mother   . Cancer Father        prostate  .  Hypertension Daughter   . Heart attack Brother        x2  . Hypertension Brother   . Stroke Sister   . Hypertension Sister   . Breast cancer Maternal Aunt      Current Outpatient Medications:  .  acetaminophen (TYLENOL) 500 MG tablet, Take 1,000 mg by mouth every 6 (six) hours as needed for mild pain, moderate pain or headache. , Disp: , Rfl:  .  amiodarone (PACERONE) 200 MG tablet, Take 1 tablet (200 mg total) by mouth daily., Disp: 90 tablet, Rfl: 2 .  anastrozole (ARIMIDEX) 1 MG tablet, Take 1 tablet (1 mg total) by mouth daily., Disp: 90 tablet, Rfl: 3 .  apixaban (ELIQUIS) 2.5 MG TABS tablet, Take 1 tablet (2.5 mg total) by mouth 2 (two) times daily., Disp: 60 tablet, Rfl: 5 .  atorvastatin (LIPITOR) 20 MG tablet, Take 1 tablet (20 mg total) by mouth at bedtime., Disp: 90 tablet, Rfl: 2 .  BD PEN NEEDLE NANO U/F 32G X 4 MM MISC, Use as directed, Disp: 150 each, Rfl: 2 .  calcium carbonate (OS-CAL) 600 MG TABS tablet, Take 600 mg by mouth daily with breakfast. , Disp: , Rfl:  .  carvedilol (COREG) 12.5 MG tablet, TAKE 1 TABLET(12.5 MG) BY MOUTH TWICE DAILY,  Disp: 180 tablet, Rfl: 2 .  Cholecalciferol (VITAMIN D-3 PO), Take 1 capsule by mouth daily after breakfast., Disp: , Rfl:  .  Cyanocobalamin (B-12 PO), Take 1 tablet by mouth daily after breakfast., Disp: , Rfl:  .  diclofenac sodium (VOLTAREN) 1 % GEL, Apply 2 g topically 4 (four) times daily. (Patient taking differently: Apply 2 g topically See admin instructions. Apply 2 grams to knees at bedtime and an additional 2 grams three times a day as needed for pain), Disp: 100 g, Rfl: 1 .  ferrous sulfate 325 (65 FE) MG tablet, Take 325 mg by mouth daily with breakfast., Disp: , Rfl:  .  glucose blood (ACCU-CHEK GUIDE) test strip, Use as instructed to check blood sugars 3 times per day prn dx: e11.22, Disp: 400 each, Rfl: 3 .  HUMALOG KWIKPEN 200 UNIT/ML SOPN, Inject 4-8 Units into the skin 3 (three) times daily. Per sliding scale  (Patient taking differently: Inject 4-8 Units into the skin See admin instructions. Inject 4-8 units into the skin three times a day before meals, per sliding scale), Disp: 15 pen, Rfl: 1 .  insulin detemir (LEVEMIR) 100 UNIT/ML injection, Take 16 units in the morning and 10 units in the evening., Disp: , Rfl:  .  isosorbide-hydrALAZINE (BIDIL) 20-37.5 MG tablet, Take 1 tablet by mouth 3 (three) times daily., Disp: 90 tablet, Rfl: 2 .  magnesium oxide (MAG-OX) 400 MG tablet, Take 400 mg by mouth daily., Disp: , Rfl:  .  omeprazole (PRILOSEC) 40 MG capsule, TAKE ONE CAPSULE BY MOUTH DAILY BEFORE A MEAL (Patient taking differently: Take 40 mg by mouth 2 (two) times daily before a meal. ), Disp: 90 capsule, Rfl: 1 .  oxybutynin (DITROPAN) 5 MG tablet, Take 1 tablet (5 mg total) by mouth 2 (two) times daily., Disp: 180 tablet, Rfl: 1 .  potassium chloride SA (K-DUR) 20 MEQ tablet, Take 2 tablets (40 mEq total) by mouth daily., Disp: 180 tablet, Rfl: 1 .  pregabalin (LYRICA) 75 MG capsule, Take 1 capsule (75 mg total) by mouth at bedtime., Disp: 90 capsule, Rfl: 2 .  senna-docusate (SENOKOT-S) 8.6-50 MG tablet, Take 2 tablets by mouth at bedtime as needed for mild constipation., Disp:  , Rfl:  .  torsemide (DEMADEX) 20 MG tablet, Take 1 tablet (20 mg total) by mouth 2 (two) times daily., Disp: 180 tablet, Rfl: 2 .  traMADol (ULTRAM) 50 MG tablet, Take 1 tablet (50 mg total) by mouth 2 (two) times daily as needed., Disp: 12 tablet, Rfl: 0   Allergies  Allergen Reactions  . Aspirin Itching  . Penicillins Itching and Rash    DID THE REACTION INVOLVE: Swelling of the face/tongue/throat, SOB, or low BP? Yes Sudden or severe rash/hives, skin peeling, or the inside of the mouth or nose? No Did it require medical treatment? Yes When did it last happen?"More than 10 years ago" If all above answers are "NO", may proceed with cephalosporin use.      Review of Systems   Today's Vitals   11/28/19 1031   BP: 130/78  Pulse: 76  Temp: 98.3 F (36.8 C)  TempSrc: Oral  Weight: 187 lb (84.8 kg)  Height: 5\' 5"  (1.651 m)   Body mass index is 31.12 kg/m.   Objective:  Physical Exam Vitals and nursing note reviewed.  Constitutional:      Appearance: She is obese.     Comments: Sitting on a wheelchair  HENT:     Head: Normocephalic.  Right Ear: External ear normal.     Left Ear: External ear normal.  Eyes:     General: No scleral icterus.    Conjunctiva/sclera: Conjunctivae normal.  Cardiovascular:     Rate and Rhythm: Normal rate.  Pulmonary:     Effort: Pulmonary effort is normal.     Breath sounds: Normal breath sounds.  Abdominal:     General: Bowel sounds are normal.     Tenderness: There is no abdominal tenderness. There is no right CVA tenderness or left CVA tenderness.  Musculoskeletal:     Cervical back: Normal range of motion.  Skin:    General: Skin is warm.  Neurological:     Mental Status: She is alert and oriented to person, place, and time.  Psychiatric:        Mood and Affect: Mood normal.        Behavior: Behavior normal.        Thought Content: Thought content normal.        Judgment: Judgment normal.   UA- + for nitrates and leuks.     Assessment And Plan:  UTI - Culture, Urine sent out She was placed on Pyridium and Cipro as noted.  FU in 2 weeks for DM.   Darrall Strey RODRIGUEZ-SOUTHWORTH, PA-C    THE PATIENT IS ENCOURAGED TO PRACTICE SOCIAL DISTANCING DUE TO THE COVID-19 PANDEMIC.

## 2019-11-28 NOTE — Patient Instructions (Signed)
Urinary Tract Infection, Adult A urinary tract infection (UTI) is an infection of any part of the urinary tract. The urinary tract includes:  The kidneys.  The ureters.  The bladder.  The urethra. These organs make, store, and get rid of pee (urine) in the body. What are the causes? This is caused by germs (bacteria) in your genital area. These germs grow and cause swelling (inflammation) of your urinary tract. What increases the risk? You are more likely to develop this condition if:  You have a small, thin tube (catheter) to drain pee.  You cannot control when you pee or poop (incontinence).  You are female, and: ? You use these methods to prevent pregnancy:  A medicine that kills sperm (spermicide).  A device that blocks sperm (diaphragm). ? You have low levels of a female hormone (estrogen). ? You are pregnant.  You have genes that add to your risk.  You are sexually active.  You take antibiotic medicines.  You have trouble peeing because of: ? A prostate that is bigger than normal, if you are female. ? A blockage in the part of your body that drains pee from the bladder (urethra). ? A kidney stone. ? A nerve condition that affects your bladder (neurogenic bladder). ? Not getting enough to drink. ? Not peeing often enough.  You have other conditions, such as: ? Diabetes. ? A weak disease-fighting system (immune system). ? Sickle cell disease. ? Gout. ? Injury of the spine. What are the signs or symptoms? Symptoms of this condition include:  Needing to pee right away (urgently).  Peeing often.  Peeing small amounts often.  Pain or burning when peeing.  Blood in the pee.  Pee that smells bad or not like normal.  Trouble peeing.  Pee that is cloudy.  Fluid coming from the vagina, if you are female.  Pain in the belly or lower back. Other symptoms include:  Throwing up (vomiting).  No urge to eat.  Feeling mixed up (confused).  Being tired  and grouchy (irritable).  A fever.  Watery poop (diarrhea). How is this treated? This condition may be treated with:  Antibiotic medicine.  Other medicines.  Drinking enough water. Follow these instructions at home:  Medicines  Take over-the-counter and prescription medicines only as told by your doctor.  If you were prescribed an antibiotic medicine, take it as told by your doctor. Do not stop taking it even if you start to feel better. General instructions  Make sure you: ? Pee until your bladder is empty. ? Do not hold pee for a long time. ? Empty your bladder after sex. ? Wipe from front to back after pooping if you are a female. Use each tissue one time when you wipe.  Drink enough fluid to keep your pee pale yellow.  Keep all follow-up visits as told by your doctor. This is important. Contact a doctor if:  You do not get better after 1-2 days.  Your symptoms go away and then come back. Get help right away if:  You have very bad back pain.  You have very bad pain in your lower belly.  You have a fever.  You are sick to your stomach (nauseous).  You are throwing up. Summary  A urinary tract infection (UTI) is an infection of any part of the urinary tract.  This condition is caused by germs in your genital area.  There are many risk factors for a UTI. These include having a small, thin   tube to drain pee and not being able to control when you pee or poop.  Treatment includes antibiotic medicines for germs.  Drink enough fluid to keep your pee pale yellow. This information is not intended to replace advice given to you by your health care provider. Make sure you discuss any questions you have with your health care provider. Document Revised: 09/27/2018 Document Reviewed: 04/19/2018 Elsevier Patient Education  2020 Elsevier Inc.  

## 2019-12-02 LAB — URINE CULTURE

## 2019-12-03 ENCOUNTER — Other Ambulatory Visit: Payer: Self-pay | Admitting: Internal Medicine

## 2019-12-04 ENCOUNTER — Other Ambulatory Visit: Payer: Self-pay | Admitting: Internal Medicine

## 2019-12-04 NOTE — Progress Notes (Unsigned)
As I was about to send rx for cipro looks like someone send it on 2/4, so please void the prior note I sent you.  Thanks

## 2019-12-05 ENCOUNTER — Telehealth: Payer: Self-pay

## 2019-12-05 NOTE — Telephone Encounter (Signed)
Rodriguez-Southworth, Sunday Spillers, PA-C  Candiss Norse T, CMA  Please infor pt that her urne culture is positive for infection. I sent cipro to her pharmacy.   As I was about to send rx for cipro looks like someone send it on 2/4, so please void the prior note I sent you.  Thanks   Spoke w/pt daughter she stated the prescription was picked up and she has completed it

## 2019-12-09 ENCOUNTER — Ambulatory Visit: Payer: Self-pay

## 2019-12-09 DIAGNOSIS — E1165 Type 2 diabetes mellitus with hyperglycemia: Secondary | ICD-10-CM

## 2019-12-10 ENCOUNTER — Other Ambulatory Visit: Payer: Self-pay | Admitting: Internal Medicine

## 2019-12-10 NOTE — Chronic Care Management (AMB) (Signed)
Chronic Care Management    Social Work Follow Up Note  12/09/2019 Name: [name] MRN: [id number] DOB: Feb 16, 1933  [name] is a 86 y.o. year old female who is a primary care patient of Stephanie Chard, MD. The CCM team was consulted for assistance with care coordination.   Review of patient status, including review of consultants reports, other relevant assessments, and collaboration with appropriate care team members and the patient's provider was performed as part of comprehensive patient evaluation and provision of chronic care management services.    SW placed a successful outbound call to the patient and her daughter to assist with care coordination needs.  Outpatient Encounter Medications as of 12/09/2019  Medication Sig Note  . acetaminophen (TYLENOL) 500 MG tablet Take 1,000 mg by mouth every 6 (six) hours as needed for mild pain, moderate pain or headache.    [name] amiodarone (PACERONE) 200 MG tablet Take 1 tablet (200 mg total) by mouth daily.   [name] anastrozole (ARIMIDEX) 1 MG tablet Take 1 tablet (1 mg total) by mouth daily.   [name] apixaban (ELIQUIS) 2.5 MG TABS tablet Take 1 tablet (2.5 mg total) by mouth 2 (two) times daily.   [name] atorvastatin (LIPITOR) 20 MG tablet Take 1 tablet (20 mg total) by mouth at bedtime.   . BD PEN NEEDLE NANO U/F 32G X 4 MM MISC Use as directed   . calcium carbonate (OS-CAL) 600 MG TABS tablet Take 600 mg by mouth daily with breakfast.    . carvedilol (COREG) 12.5 MG tablet TAKE 1 TABLET(12.5 MG) BY MOUTH TWICE DAILY   . Cholecalciferol (VITAMIN D-3 PO) Take 1 capsule by mouth daily after breakfast.   . ciprofloxacin (CIPRO) 250 MG tablet Take 1 tablet (250 mg total) by mouth 2 (two) times daily.   . Cyanocobalamin (B-12 PO) Take 1 tablet by mouth daily after breakfast.   . diclofenac sodium (VOLTAREN) 1 % GEL Apply 2 g topically 4 (four) times daily. (Patient taking differently: Apply 2 g topically See admin instructions. Apply 2 grams to knees at bedtime  and an additional 2 grams three times a day as needed for pain)   . ferrous sulfate 325 (65 FE) MG tablet Take 325 mg by mouth daily with breakfast.   . glucose blood (ACCU-CHEK GUIDE) test strip Use as instructed to check blood sugars 3 times per day prn dx: e11.22   . HUMALOG KWIKPEN 200 UNIT/ML SOPN Inject 4-8 Units into the skin 3 (three) times daily. Per sliding scale (Patient taking differently: Inject 4-8 Units into the skin See admin instructions. Inject 4-8 units into the skin three times a day before meals, per sliding scale) 08/10/2019: Regimen confirmed to be accurate by the patient  . insulin detemir (LEVEMIR) 100 UNIT/ML injection Take 16 units in the morning and 10 units in the evening.   . isosorbide-hydrALAZINE (BIDIL) 20-37.5 MG tablet Take 1 tablet by mouth 3 (three) times daily.   . magnesium oxide (MAG-OX) 400 MG tablet Take 400 mg by mouth daily.   [name] omeprazole (PRILOSEC) 40 MG capsule TAKE ONE CAPSULE BY MOUTH DAILY BEFORE A MEAL (Patient taking differently: Take 40 mg by mouth 2 (two) times daily before a meal. )   . oxybutynin (DITROPAN) 5 MG tablet Take 1 tablet (5 mg total) by mouth 2 (two) times daily.   . phenazopyridine (PYRIDIUM) 200 MG tablet One bid prn dysuria   . potassium chloride SA (K-DUR) 20 MEQ tablet Take 2 tablets (40  mEq total) by mouth daily.   . pregabalin (LYRICA) 75 MG capsule Take 1 capsule (75 mg total) by mouth at bedtime.   . senna-docusate (SENOKOT-S) 8.6-50 MG tablet Take 2 tablets by mouth at bedtime as needed for mild constipation.   . torsemide (DEMADEX) 20 MG tablet Take 1 tablet (20 mg total) by mouth 2 (two) times daily.   . traMADol (ULTRAM) 50 MG tablet Take 1 tablet (50 mg total) by mouth 2 (two) times daily as needed.    No facility-administered encounter medications on file as of 12/09/2019.     Goals Addressed            This Visit's Progress   . COMPLETED: "My mom needs ted hose and diabetic shoes"       Daughter [name]  stated:  Current Barriers:  . Lacks ability to obtain items without physician orders . Diagnosis of DM II  Clinical Social Work Clinical Goal(s):  [name] Over the next 45 days the patient will work with SW to obtain desired DME supplies  CCM SW Interventions: Completed with [name] on 12/09/2019 . Contacted patient's daughter to assess progression of patient goal . Reviewed importance of following up with podiatrist regarding desire for new DM shoes . Confirmed plan to have the patient and her daughter access podiatry services to obtain orders for DM shoes  Patient Self Care Activities:  . Self administers medications as prescribed . Performs ADL's independently . Calls provider office for new concerns or questions  Please see past updates related to this goal by clicking on the "Past Updates" button in the selected goal          Follow Up Plan: No further SW follow up planned at this time. The patient is encouraged to contact SW with future resource needs.   [name], BSW, CDP Social Worker, Certified Dementia Practitioner [facility] [phone]  Total time spent performing care coordination and/or care management activities with the patient by phone or face to face = 9 minutes.

## 2019-12-10 NOTE — Patient Instructions (Signed)
Social Worker Visit Information  Goals we discussed today:  Goals Addressed            This Visit's Progress   . COMPLETED: "My mom needs ted hose and diabetic shoes"       Daughter [name] stated:  Current Barriers:  . Lacks ability to obtain items without physician orders . Diagnosis of DM II  Clinical Social Work Clinical Goal(s):  [name] Over the next 45 days the patient will work with SW to obtain desired DME supplies  CCM SW Interventions: Completed with [facility] on 12/09/2019 . Contacted patient's daughter to assess progression of patient goal . Reviewed importance of following up with podiatrist regarding desire for new DM shoes . Confirmed plan to have the patient and her daughter access podiatry services to obtain orders for DM shoes  Patient Self Care Activities:  . Self administers medications as prescribed . Performs ADL's independently . Calls provider office for new concerns or questions  Please see past updates related to this goal by clicking on the "Past Updates" button in the selected goal          Follow Up Plan: No SW follow up planned at this time. The patient is encouraged to contact SW with future resource needs.   [name], BSW, CDP Social Worker, Certified Dementia Practitioner [facility] [phone]

## 2019-12-11 ENCOUNTER — Other Ambulatory Visit: Payer: Self-pay | Admitting: Internal Medicine

## 2019-12-11 ENCOUNTER — Other Ambulatory Visit: Payer: Self-pay | Admitting: Nurse Practitioner

## 2019-12-11 DIAGNOSIS — E1122 Type 2 diabetes mellitus with diabetic chronic kidney disease: Secondary | ICD-10-CM

## 2019-12-11 DIAGNOSIS — Z794 Long term (current) use of insulin: Secondary | ICD-10-CM

## 2019-12-17 ENCOUNTER — Other Ambulatory Visit: Payer: Self-pay | Admitting: Internal Medicine

## 2019-12-21 ENCOUNTER — Other Ambulatory Visit: Payer: Self-pay | Admitting: Internal Medicine

## 2019-12-23 ENCOUNTER — Other Ambulatory Visit: Payer: Self-pay

## 2019-12-23 ENCOUNTER — Telehealth: Payer: Self-pay

## 2019-12-23 ENCOUNTER — Ambulatory Visit: Payer: Self-pay

## 2019-12-23 DIAGNOSIS — E1165 Type 2 diabetes mellitus with hyperglycemia: Secondary | ICD-10-CM

## 2019-12-23 DIAGNOSIS — I5032 Chronic diastolic (congestive) heart failure: Secondary | ICD-10-CM

## 2019-12-23 DIAGNOSIS — C50311 Malignant neoplasm of lower-inner quadrant of right female breast: Secondary | ICD-10-CM

## 2019-12-23 DIAGNOSIS — Z170 Estrogen receptor positive status [ER+]: Secondary | ICD-10-CM

## 2019-12-23 DIAGNOSIS — I130 Hypertensive heart and chronic kidney disease with heart failure and stage 1 through stage 4 chronic kidney disease, or unspecified chronic kidney disease: Secondary | ICD-10-CM

## 2019-12-24 NOTE — Chronic Care Management (AMB) (Signed)
  Chronic Care Management   Outreach Note  12/24/2019 Name: [name] MRN: [id number] DOB: [birth year]  Referred by: [name], MD Reason for referral : Chronic Care Management (FU Call - DMII, CHF, Malignant Neoplasm, CKD)   An unsuccessful telephone outreach was attempted today. The patient was referred to the case management team for assistance with care management and care coordination.   Follow Up Plan: A HIPPA compliant phone message was left for the patient providing contact information and requesting a return call.  Telephone follow up appointment with care management team member scheduled for: 01/01/20  [name], RN, BSN, CCM Care Management Coordinator [facility]  Direct Phone: [phone]

## 2020-01-01 ENCOUNTER — Telehealth: Payer: Self-pay

## 2020-01-08 ENCOUNTER — Ambulatory Visit: Payer: Medicare Other | Admitting: Orthopaedic Surgery

## 2020-01-08 ENCOUNTER — Other Ambulatory Visit: Payer: Self-pay | Admitting: *Deleted

## 2020-01-08 MED ORDER — AMIODARONE HCL 200 MG PO TABS: 200.0000 mg | ORAL_TABLET | Freq: Every day | ORAL | 3 refills | Status: DC

## 2020-01-10 ENCOUNTER — Other Ambulatory Visit: Payer: Self-pay

## 2020-01-10 ENCOUNTER — Ambulatory Visit: Payer: Medicare Other | Admitting: Orthopaedic Surgery

## 2020-01-10 ENCOUNTER — Ambulatory Visit: Payer: Self-pay

## 2020-01-10 DIAGNOSIS — G8929 Other chronic pain: Secondary | ICD-10-CM

## 2020-01-10 DIAGNOSIS — M25561 Pain in right knee: Secondary | ICD-10-CM | POA: Diagnosis not present

## 2020-01-10 DIAGNOSIS — R6889 Other general symptoms and signs: Secondary | ICD-10-CM | POA: Diagnosis not present

## 2020-01-10 NOTE — Progress Notes (Signed)
Office Visit Note   Patient: [name]           Date of Birth: June 11, 1933           MRN: [id number] Visit Date: 01/10/2020              Requested by: [name], [address] [place],  [place] [address] PCP: [name], MD   Assessment & Plan: Visit Diagnoses:  1. Chronic pain of right knee     Plan: End-stage osteoarthritis with 40 degrees valgus.  With all her medical problems she is not really a candidate for total knee arthroplasty.  Prescription for brace given for knee brace with metal upright.  In the past when she tried a knee immobilizer she states that road up on her she had problems taking it on and off.  She principally applies the knee sleeve when she stands or transfers.  Follow-up here as needed.  Follow-Up Instructions: No follow-ups on file.   Orders:  Orders Placed This Encounter  Procedures  . XR KNEE 3 VIEW RIGHT   No orders of the defined types were placed in this encounter.     Procedures: No procedures performed   Clinical Data: No additional findings.   Subjective: Chief Complaint  Patient presents with  . Right Knee - Pain   HPI 86 year old female returns she is using a wheelchair she states when she stands her leg does not want to hold her well.  She has had severe longstanding osteoarthritis but has multiple problems including diabetes pacemaker heart failure.  She has been on tramadol and Tylenol.  She states in a wheelchair she does not have knee pain just pain with giving way.  She has had Velcro knee sleeve she is used in the past and had a metal stay that seemed to help some but now to worn out and she is requesting a prescription for a new brace.   Review of Systems is systems positive for history of right lateral ankle fracture treated by me in the past.  Renal insufficiency, pulmonary edema, pancreatitis, diastolic heart failure, pacemaker from heart block, DVT, diabetes on insulin , chronic kidney disease  stage III, history of aphasia, breast cancer.  Patient is currently on Eliquis.  Otherwise noncontributory as pertains HPI.   Objective: Vital Signs: There were no vitals taken for this visit.  Physical Exam Constitutional:      Appearance: She is well-developed.  HENT:     Head: Normocephalic.     Right Ear: External ear normal.     Left Ear: External ear normal.  Eyes:     Pupils: Pupils are equal, round, and reactive to light.  Neck:     Thyroid: No thyromegaly.     Trachea: No tracheal deviation.  Cardiovascular:     Rate and Rhythm: Normal rate.  Pulmonary:     Effort: Pulmonary effort is normal.  Abdominal:     Palpations: Abdomen is soft.  Skin:    General: Skin is warm and dry.  Neurological:     Mental Status: She is alert and oriented to person, place, and time.  Psychiatric:        Behavior: Behavior normal.     Ortho Exam right knee is in 3040 degrees valgus.  With standing position she shifts to 40 degrees valgus.  She passively corrects to within 10 degrees of being straight.  Severe crepitus knee range of motion she does not reach full  extension.  She can flex past 90 degrees.  Bilateral lower extremity swelling.  Anterior tib gastrocsoleus is intact.  Specialty Comments:  No specialty comments available.  Imaging: No results found.   PMFS History: Patient Active Problem List   Diagnosis Date Noted  . Syncope 08/10/2019  . Displaced fracture of lateral malleolus of right fibula, initial encounter for closed fracture 07/10/2019  . Pain in left shoulder 07/10/2019  . Acute CHF (congestive heart failure) ([place]) 06/09/2019  . Acute pulmonary edema (HCC)   . Personal history of breast cancer 04/22/2019  . Pressure ulcer 01/04/2019  . Acute pancreatitis 01/02/2019  . Malignant neoplasm of lower-inner quadrant of right breast of female, estrogen receptor positive ([place]) 11/23/2018  . Hematoma of right iliopsoas muscle 11/06/2018  . Intramuscular hematoma  11/06/2018  . Hypertensive heart disease with heart failure ([place]) 09/05/2017  . Aphasia 08/31/2017  . Dysphagia 08/31/2017  . CKD (chronic kidney disease), stage III ([place])   . LBBB (left bundle branch block)   . Anemia   . HTN (hypertension)   . Obesity   . DVT (deep venous thrombosis) ([place])   . DM (diabetes mellitus) ([place]) 07/28/2015  . Fall 07/28/2015  . Warfarin-induced coagulopathy ([place]) 07/28/2015  . Acute on chronic diastolic CHF (congestive heart failure) ([place]) 03/19/2014  . Edema 08/06/2013  . SVT (supraventricular tachycardia) ([place]) 10/15/2012  . First degree AV block 10/15/2012  . Acute renal insufficiency 10/14/2012  . Dizziness and giddiness   . Thrombocytopenia ([place]) 11/21/2011   Past Medical History:  Diagnosis Date  . Abnormal echocardiogram    a. possible mass on echo 05/2019 on PPM, b. no evidence of mass / atrial lead vegetation on follow up echo 06/2019  . Anemia   . Arthritis   . Cancer ([place])   . Cataract   . Chronic combined systolic and diastolic CHF (congestive heart failure) ([place])    a. Previously diastolic but patient AVS from outside hospital listed systolic CHF and cardiomyopathy, records pending.  . CKD (chronic kidney disease), stage III   . Clotting disorder ([place])   . CVA (cerebral vascular accident) ([place])    residual right sided weakness and mild dysphagia  . Diabetes mellitus ([place])   . Dizziness and giddiness   . DVT (deep venous thrombosis) (H[facility])    a. on anticoagulation for this.  . Essential hypertension   . LBBB (left bundle branch block)   . Mitral regurgitation    a. Mod MR by echo 2014.  . Muscular deconditioning   . Obesity   . Pacemaker   . Pericardial effusion   . SVT (supraventricular tachycardia) ([facility])    a. In 2013 she had an EPS with ablation for SVT which did not eliminate the SVT completely. She also had bradycardia which limited medication. She was placed on amiodarone by Dr. Lovena Le.  . Thrombocytopenia ([facility]) 11/21/2011     Family History  Problem Relation Age of Onset  . Stroke Mother   . Cancer Father        prostate  . Hypertension Daughter   . Heart attack Brother        x2  . Hypertension Brother   . Stroke Sister   . Hypertension Sister   . Breast cancer Maternal Aunt     Past Surgical History:  Procedure Laterality Date  . EYE SURGERY    . SUPRAVENTRICULAR TACHYCARDIA ABLATION N/A 10/11/2012   Procedure: SUPRAVENTRICULAR TACHYCARDIA ABLATION;  Surgeon: Evans Lance, MD;  [facility]  CATH LAB;  Service: Cardiovascular;  Laterality: N/A;   Social History   Occupational History  . Occupation: retired  Tobacco Use  . Smoking status: Never Smoker  . Smokeless tobacco: Never Used  Substance and Sexual Activity  . Alcohol use: No  . Drug use: No  . Sexual activity: Not Currently

## 2020-01-13 ENCOUNTER — Ambulatory Visit: Payer: Medicare Other | Admitting: Internal Medicine

## 2020-01-13 NOTE — Progress Notes (Signed)
Cardiology Office Note   Date:  01/14/2020   ID:  Stephanie, [name] 1933-07-05, MRN [id number]  PCP:  Stephanie Chard, MD    No chief complaint on file.  Chronic diastolic heart failure  Wt Readings from Last 3 Encounters:  01/14/20 192 lb (87.1 kg)  11/28/19 187 lb (84.8 kg)  11/12/19 185 lb 14.4 oz (84.3 kg)       History of Present Illness: [name] is a 86 y.o. female  who has had diastolic dysfunction in the past. She is on Coumadin for DVT. She has had atrial arrhythmias and chronic LE edema.   In addition to thehistory of h/o DVT (on Coumadin for this), chronic diastolic CHF, she also has deconditioning, obesity, HTN, SVT (correlated with pre-syncope, on amiodarone), DM, chronic anemia/thrombocytopenia, LBBB, probable CKD stage III (Cr 1.3 in 08/2014), mod MR by echo 2014 who presents for f/u. Per notes in 2013 she had an EPS with ablation for SVT which did not eliminate the SVT completely. She also had bradycardia which limited medication. She was placed on amiodarone by Dr. Lovena Le. Last echo 07/2013: mild LVH, moderate focal basal hypertrophy of septum, EF 55-60%, grade 1 DD, mod MR, mod LAE, PASP 40.  She sustained a mechanical fall in 07/2015 and was in the hospital for a week.  She was seen in [facility] 2017 for fluid overload. Lasix was increased and potassium was increased.  In 2018, there was a concern regarding stroke as she developed some stuttering and swallowing difficulties. Her workup has been unrevealing at this time. We checked carotid Doppler and echocardiogram.  Prior records show: "She followed with nephrology and cardiology in [place] and also has established care with [facility] as well. There are records in [place] but these only appear to be patient discharge instructions rather than provider notes.  She was admitted 06/08/2019 with shortness of breath and arm pain, found to have acute on chronic diastolic CHF requiring  diuresis. Her troponin was elevated to a peak of 310, felt due to demand ischemia in absence of true angina. Left arm pain was felt MSK, supported by plain films.No further ischemic workup was recommended.Age adjusted D-dimer was felt wnl. Per Dr. Lovena Le, device appeared to be working normally and did not need it interrogated during admission but routine EPf/u wasrecommended. 2D echo 06/09/19 showed EF 50-55%, impaired relaxation, akinetic apex,mildly reduced RV function, severe LAE, moderate RAE, small pericardial effusion, circular mass attached to pacemaker lead in right atrium. Per Dr. Francesca Oman note, "In the absence of fever or sign of sepsis I would not pursue further work-up at this point given the patient age and comorbidities. I would consider TEE once any signs of infection." She recommended to restart oral Bumex after a 1 day holiday from diuresis. F/u Crwas still elevated on f/u labs 8/31 so lisinopril was discontinued. F/u BMET 06/28/19 showed K 4.5, Cr 1.87 (previous range variable from 1.4-2.3). Otherwise recent labs 05/2019 showed Hgb 8.9 (variable in 9-10 range this year), Plt 105 (variable in 2020), WBC 2.4, A1C 7.9, 07/2018 LDL 96.  She had a pacer placed in [place], and has f/u in our PM clinic scheduled.   She was diagnosed with breast cancer and the plan was for her to have surgery in [place].  Ultimately, she did not have surgery and is being treated with anastrozole.  There was a concern about putting her to sleep.   She has ben bothered by arthritis in her right  shoulder.   Denies : Chest pain. Dizziness. Nitroglycerin use. Orthopnea. Palpitations. Paroxysmal nocturnal dyspnea. Shortness of breath. Syncope.     Past Medical History:  Diagnosis Date  . Abnormal echocardiogram    a. possible mass on echo 05/2019 on PPM, b. no evidence of mass / atrial lead vegetation on follow up echo 06/2019  . Anemia   . Arthritis   . Cancer ([place])   . Cataract   . Chronic combined  systolic and diastolic CHF (congestive heart failure) ([place])    a. Previously diastolic but patient AVS from outside hospital listed systolic CHF and cardiomyopathy, records pending.  . CKD (chronic kidney disease), stage III   . Clotting disorder ([place])   . CVA (cerebral vascular accident) ([place])    residual right sided weakness and mild dysphagia  . Diabetes mellitus ([place])   . Dizziness and giddiness   . DVT (deep venous thrombosis) ([facility])    a. on anticoagulation for this.  . Essential hypertension   . LBBB (left bundle branch block)   . Mitral regurgitation    a. Mod MR by echo 2014.  . Muscular deconditioning   . Obesity   . Pacemaker   . Pericardial effusion   . SVT (supraventricular tachycardia) ([place])    a. In 2013 she had an EPS with ablation for SVT which did not eliminate the SVT completely. She also had bradycardia which limited medication. She was placed on amiodarone by Dr. Lovena Le.  . Thrombocytopenia ([place]) 11/21/2011    Past Surgical History:  Procedure Laterality Date  . EYE SURGERY    . SUPRAVENTRICULAR TACHYCARDIA ABLATION N/A 10/11/2012   Procedure: SUPRAVENTRICULAR TACHYCARDIA ABLATION;  Surgeon: [name] Lance, MD;  Location: [facility] CATH LAB;  Service: Cardiovascular;  Laterality: N/A;     Current Outpatient Medications  Medication Sig Dispense Refill  . ACCU-CHEK AVIVA PLUS test strip USE AS DIRECTED TO CHECK BLOOD SUGAR FOUR TIMES DAILY 300 strip 2  . acetaminophen (TYLENOL) 500 MG tablet Take 1,000 mg by mouth every 6 (six) hours as needed for mild pain, moderate pain or headache.     [name] amiodarone (PACERONE) 200 MG tablet Take 1 tablet (200 mg total) by mouth daily. 90 tablet 3  . anastrozole (ARIMIDEX) 1 MG tablet Take 1 tablet (1 mg total) by mouth daily. 90 tablet 3  . apixaban (ELIQUIS) 2.5 MG TABS tablet Take 1 tablet (2.5 mg total) by mouth 2 (two) times daily. 60 tablet 5  . atorvastatin (LIPITOR) 20 MG tablet Take 1 tablet (20 mg total) by mouth at bedtime.  90 tablet 2  . BD PEN NEEDLE NANO U/F 32G X 4 MM MISC Use as directed 150 each 2  . calcium carbonate (OS-CAL) 600 MG TABS tablet Take 600 mg by mouth daily with breakfast.     . carvedilol (COREG) 12.5 MG tablet TAKE 1 TABLET(12.5 MG) BY MOUTH TWICE DAILY 180 tablet 2  . Cholecalciferol (VITAMIN D-3 PO) Take 1 capsule by mouth daily after breakfast.    . Cyanocobalamin (B-12 PO) Take 1 tablet by mouth daily after breakfast.    . diclofenac sodium (VOLTAREN) 1 % GEL Apply 2 g topically 4 (four) times daily. (Patient taking differently: Apply 2 g topically See admin instructions. Apply 2 grams to knees at bedtime and an additional 2 grams three times a day as needed for pain) 100 g 1  . ferrous sulfate 325 (65 FE) MG tablet Take 325 mg by mouth daily with breakfast.    .  HUMALOG KWIKPEN 200 UNIT/ML SOPN Inject 4-8 Units into the skin 3 (three) times daily. Per sliding scale (Patient taking differently: Inject 4-8 Units into the skin See admin instructions. Inject 4-8 units into the skin three times a day before meals, per sliding scale) 15 pen 1  . insulin detemir (LEVEMIR) 100 UNIT/ML injection Take 16 units in the morning and 10 units in the evening.    . isosorbide-hydrALAZINE (BIDIL) 20-37.5 MG tablet Take 1 tablet by mouth 3 (three) times daily. 90 tablet 2  . LEVEMIR FLEXTOUCH 100 UNIT/ML Pen INJECT 16 UNITS UNDER THE SKIN IN THE MORNING AND 10 UNITS IN THE EVENING 15 mL 2  . magnesium oxide (MAG-OX) 400 MG tablet Take 400 mg by mouth daily.    [name] omeprazole (PRILOSEC) 40 MG capsule TAKE 1 CAPSULE BY MOUTH DAILY BEFORE A MEAL 90 capsule 1  . oxybutynin (DITROPAN) 5 MG tablet TAKE 1 TABLET(5 MG) BY MOUTH TWICE DAILY 180 tablet 1  . potassium chloride SA (KLOR-CON) 20 MEQ tablet TAKE 2 TABLETS(40 MEQ) BY MOUTH DAILY 180 tablet 1  . pregabalin (LYRICA) 75 MG capsule Take 1 capsule (75 mg total) by mouth at bedtime. 90 capsule 2  . senna-docusate (SENOKOT-S) 8.6-50 MG tablet Take 2 tablets by mouth  at bedtime as needed for mild constipation.    . torsemide (DEMADEX) 20 MG tablet Take 1 tablet (20 mg total) by mouth 2 (two) times daily. 180 tablet 2  . traMADol (ULTRAM) 50 MG tablet Take 1 tablet (50 mg total) by mouth 2 (two) times daily as needed. 12 tablet 0  . ciprofloxacin (CIPRO) 250 MG tablet Take 1 tablet (250 mg total) by mouth 2 (two) times daily. (Patient not taking: Reported on 01/14/2020) 14 tablet 0  . metoprolol succinate (TOPROL-XL) 50 MG 24 hr tablet TAKE 1 TABLET BY MOUTH EVERY DAY 90 tablet 0  . phenazopyridine (PYRIDIUM) 200 MG tablet One bid prn dysuria (Patient not taking: Reported on 01/14/2020) 6 tablet 0   No current facility-administered medications for this visit.    Allergies:   Aspirin and Penicillins    Social History:  The patient  reports that she has never smoked. She has never used smokeless tobacco. She reports that she does not drink alcohol or use drugs.   Family History:  The patient's family history includes Breast cancer in her maternal aunt; Cancer in her father; Heart attack in her brother; Hypertension in her brother, daughter, and sister; Stroke in her mother and sister.    ROS:  Please see the history of present illness.   Otherwise, review of systems are positive for leg edema.   All other systems are reviewed and negative.    PHYSICAL EXAM: VS:  BP (!) 144/80 (BP Location: Right Arm, Patient Position: Sitting)   Pulse 68   Wt 192 lb (87.1 kg)   SpO2 97%   BMI 31.95 kg/m  , BMI Body mass index is 31.95 kg/m. GEN: Well nourished, well developed, in no acute distress ; slurred speech- mild HEENT: normal  Neck: no JVD, carotid bruits, or masses Cardiac: RRR; no murmurs, rubs, or gallops,; bilateral leg edema  Respiratory:  clear to auscultation bilaterally, normal work of breathing GI: soft, nontender, nondistended, + BS MS: crooked right leg at knee- inverted Skin: warm and dry, no rash Neuro:  Strength and sensation are  intact Psych: euthymic mood, full affect   EKG:   The ekg ordered in 07/2019 demonstrates V paced   Recent  Labs: 07/02/2019: Magnesium 2.3; TSH 2.140 08/10/2019: ALT 41; B Natriuretic Peptide 138.5 08/22/2019: BUN 40; Creatinine, Ser 1.69; Hemoglobin 8.9; Platelets 112; Potassium 3.5; Sodium 138   Lipid Panel    Component Value Date/Time   CHOL 195 08/15/2018 1236   TRIG 56 01/02/2019 1425   HDL 81 08/15/2018 1236   CHOLHDL 2.4 08/15/2018 1236   CHOLHDL 2.6 12/11/2008 0130   VLDL 13 12/11/2008 0130   LDLCALC 96 08/15/2018 1236     Other studies Reviewed: Additional studies/ records that were reviewed today with results demonstrating: labs reviewed.  Cr 1.69 when checked with PMD.   ASSESSMENT AND PLAN:  1. Chronic diastolic heart failure: Can increase torsemide to take an extra pill when she feels increased leg swelling. 2. Hypertensive heart disease: Adequately controlled.  3. Lower extremity edema: Elevate legs.  Compression stockings being used.  4. 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 5. Anticoagulated: No bleeding issues.  Now on lower dose Eliquis. 6. Pacemaker, there was a concern for a lead infection.  She was seen by Dr. Lovena Le.  Pacer was functioning normally.  Due to lack of symptoms of infection, no TEE was done.  No fevers or chills at this time. 7. COVID vaccine recommended.   Current medicines are reviewed at length with the patient today.  The patient concerns regarding her medicines were addressed.  The following changes have been made:ok to take increased torsemide  Labs/ tests ordered today include:  No orders of the defined types were placed in this encounter.   Recommend 150 minutes/week of aerobic exercise Low fat, low carb, high fiber diet recommended  Disposition:   FU in 1 year   Signed, Larae Grooms, MD  01/14/2020 2:28 PM    [facility] [address], [place], [place]   [address] Phone: [phone]; Fax: [phone]

## 2020-01-14 ENCOUNTER — Other Ambulatory Visit: Payer: Self-pay | Admitting: Hematology and Oncology

## 2020-01-14 ENCOUNTER — Ambulatory Visit: Payer: Medicare Other | Admitting: Interventional Cardiology

## 2020-01-14 ENCOUNTER — Other Ambulatory Visit: Payer: Self-pay

## 2020-01-14 ENCOUNTER — Encounter: Payer: Self-pay | Admitting: Interventional Cardiology

## 2020-01-14 VITALS — BP 144/80 | HR 68 | Wt 192.0 lb

## 2020-01-14 DIAGNOSIS — I110 Hypertensive heart disease with heart failure: Secondary | ICD-10-CM | POA: Diagnosis not present

## 2020-01-14 DIAGNOSIS — I471 Supraventricular tachycardia: Secondary | ICD-10-CM | POA: Diagnosis not present

## 2020-01-14 DIAGNOSIS — I5032 Chronic diastolic (congestive) heart failure: Secondary | ICD-10-CM

## 2020-01-14 DIAGNOSIS — R6889 Other general symptoms and signs: Secondary | ICD-10-CM | POA: Diagnosis not present

## 2020-01-14 MED ORDER — TORSEMIDE 20 MG PO TABS: 20.0000 mg | ORAL_TABLET | Freq: Two times a day (BID) | ORAL | 2 refills | Status: DC

## 2020-01-14 NOTE — Patient Instructions (Addendum)
Medication Instructions:  Your physician has recommended you make the following change in your medication:   You may take an extra 20 mg of torsemide daily AS NEEDED for increased swelling  *If you need a refill on your cardiac medications before your next appointment, please call your pharmacy*   Lab Work: None ordered  If you have labs (blood work) drawn today and your tests are completely normal, you will receive your results only by: [name] MyChart Message (if you have MyChart) OR . A paper copy in the mail If you have any lab test that is abnormal or we need to change your treatment, we will call you to review the results.   Testing/Procedures: None ordered   Follow-Up: At [facility], you and your health needs are our priority.  As part of our continuing mission to provide you with exceptional heart care, we have created designated Provider Care Teams.  These Care Teams include your primary Cardiologist (physician) and Advanced Practice Providers (APPs -  Physician Assistants and Nurse Practitioners) who all work together to provide you with the care you need, when you need it.  We recommend signing up for the patient portal called "MyChart".  Sign up information is provided on this After Visit Summary.  MyChart is used to connect with patients for Virtual Visits (Telemedicine).  Patients are able to view lab/test results, encounter notes, upcoming appointments, etc.  Non-urgent messages can be sent to your provider as well.   To learn more about what you can do with MyChart, go to [email or web address].    Your next appointment:   12 month(s)  The format for your next appointment:   In Person  Provider:   You may see Larae Grooms, MD or one of the following Advanced Practice Providers on your designated Care Team:    Melina Copa, PA-C  Ermalinda Barrios, PA-C    Other Instructions  We are recommending the COVID-19 vaccine to all of our patients. Cardiac medications  (including blood thinners) should not deter anyone from being vaccinated and there is no need to hold any of those medications prior to vaccine administration.   Currently, there is a hotline to call (active 11/01/19) to schedule vaccination appointments as no walk-ins will be accepted.    Vaccines through the health department can be arranged by calling [phone]    Vaccines through [facility] can be arranged by calling [phone] or visiting [email or web address]   If you have further questions or concerns about the vaccine process, please visit [email or web address], [email or web address], or contact your primary care physician.

## 2020-01-20 DIAGNOSIS — N3021 Other chronic cystitis with hematuria: Secondary | ICD-10-CM | POA: Diagnosis not present

## 2020-01-20 DIAGNOSIS — R3915 Urgency of urination: Secondary | ICD-10-CM | POA: Diagnosis not present

## 2020-02-03 ENCOUNTER — Telehealth: Payer: Self-pay

## 2020-02-03 NOTE — Telephone Encounter (Signed)
Multiple attempts made to return call to patient. Unable to leave vm on all phones.

## 2020-02-11 ENCOUNTER — Other Ambulatory Visit: Payer: Self-pay | Admitting: Internal Medicine

## 2020-02-11 DIAGNOSIS — E1122 Type 2 diabetes mellitus with diabetic chronic kidney disease: Secondary | ICD-10-CM

## 2020-02-17 DIAGNOSIS — I951 Orthostatic hypotension: Secondary | ICD-10-CM | POA: Diagnosis not present

## 2020-02-17 DIAGNOSIS — I509 Heart failure, unspecified: Secondary | ICD-10-CM | POA: Diagnosis not present

## 2020-02-17 DIAGNOSIS — N183 Chronic kidney disease, stage 3 unspecified: Secondary | ICD-10-CM | POA: Diagnosis not present

## 2020-02-17 DIAGNOSIS — D631 Anemia in chronic kidney disease: Secondary | ICD-10-CM | POA: Diagnosis not present

## 2020-02-17 DIAGNOSIS — I129 Hypertensive chronic kidney disease with stage 1 through stage 4 chronic kidney disease, or unspecified chronic kidney disease: Secondary | ICD-10-CM | POA: Diagnosis not present

## 2020-02-28 ENCOUNTER — Other Ambulatory Visit: Payer: Self-pay

## 2020-02-28 MED ORDER — BIDIL 20-37.5 MG PO TABS: 1.0000 | ORAL_TABLET | Freq: Three times a day (TID) | ORAL | 3 refills | Status: DC

## 2020-03-02 ENCOUNTER — Ambulatory Visit: Payer: Medicare Other | Admitting: Podiatry

## 2020-03-12 ENCOUNTER — Other Ambulatory Visit: Payer: Self-pay

## 2020-03-12 ENCOUNTER — Ambulatory Visit: Payer: Medicare Other | Admitting: Podiatry

## 2020-03-12 ENCOUNTER — Encounter: Payer: Self-pay | Admitting: Podiatry

## 2020-03-12 VITALS — Temp 98.2°F

## 2020-03-12 DIAGNOSIS — B351 Tinea unguium: Secondary | ICD-10-CM

## 2020-03-12 DIAGNOSIS — S9032XA Contusion of left foot, initial encounter: Secondary | ICD-10-CM

## 2020-03-12 DIAGNOSIS — R6889 Other general symptoms and signs: Secondary | ICD-10-CM | POA: Diagnosis not present

## 2020-03-12 DIAGNOSIS — M79674 Pain in right toe(s): Secondary | ICD-10-CM

## 2020-03-12 DIAGNOSIS — M79675 Pain in left toe(s): Secondary | ICD-10-CM | POA: Diagnosis not present

## 2020-03-15 ENCOUNTER — Other Ambulatory Visit: Payer: Self-pay | Admitting: Internal Medicine

## 2020-03-16 NOTE — Progress Notes (Signed)
Subjective:   Patient ID: [name], female   DOB: 86 y.o.   MRN: [id number]   HPI Patient presents with caregiver with 2 different problems with 1 being a black area on the left medial side of the plantar heel left measuring about 2 x 2 centimeter with no history of contusion with thick yellow brittle nailbeds noted 1-5 both feet that she cannot take care of and they are painful   ROS      Objective:  Physical Exam  Neurovascular status unchanged with an area of discoloration plantar medial left heel measuring around 2 x 2 centimeter localized with no erythema edema or drainage surrounding it with thick yellow brittle nailbeds 1-5 both feet     Assessment:  Probable low-grade contusion left that the patient was not aware of with mycotic nail infection 1-5 both feet     Plan:  H&P reviewed conditions and at this time sterile debridement of nailbeds 1-5 both feet accomplished with patient having careful debridement of the side left and I did not note any breakdown of tissue or pathology I explained that should heal but if it were to start to drain or breakdown the patient is to let us know immediately and I educated caregiver on this

## 2020-03-18 DIAGNOSIS — N183 Chronic kidney disease, stage 3 unspecified: Secondary | ICD-10-CM | POA: Diagnosis not present

## 2020-03-19 ENCOUNTER — Telehealth: Payer: Self-pay | Admitting: Internal Medicine

## 2020-03-19 NOTE — Telephone Encounter (Signed)
I agree with using extra torsemide over the next few days.  Let us know how she is doing next week.  JV

## 2020-03-19 NOTE — Telephone Encounter (Signed)
Spoke with the patient who states that she has been getting more SOB this week. She states that it only occurs with exertion. She has not been up and moving around as often and gets SOB when wheeling herself around in her wheelchair. She denies any chest pain. She states that she does have some swelling in her legs. Her daughter states that the swelling has not increased. She reports that she does elevate her legs and use her compression hose. I advised the patient that she could take an extra 20mg  of torsemide daily for increased swelling.  The patient's daughter states that she is going to speak with the case manager tomorrow and they are going to try and set up in home PT for the patient.

## 2020-03-19 NOTE — Telephone Encounter (Signed)
Pt c/o Shortness Of Breath: STAT if SOB developed within the last 24 hours or pt is noticeably SOB on the phone  1. Are you currently SOB (can you hear that pt is SOB on the phone)? No  2. How long have you been experiencing SOB? A couple of days.   3. Are you SOB when sitting or when up moving around? Moving around  4. Are you currently experiencing any other symptoms? No

## 2020-03-19 NOTE — Telephone Encounter (Signed)
Spoke with the patient and she will take an extra 20 mg torsemide for the next few days and call us back next week with an update.

## 2020-03-20 ENCOUNTER — Telehealth: Payer: Self-pay

## 2020-03-24 ENCOUNTER — Ambulatory Visit: Payer: Self-pay

## 2020-03-24 DIAGNOSIS — I130 Hypertensive heart and chronic kidney disease with heart failure and stage 1 through stage 4 chronic kidney disease, or unspecified chronic kidney disease: Secondary | ICD-10-CM

## 2020-03-24 DIAGNOSIS — E1165 Type 2 diabetes mellitus with hyperglycemia: Secondary | ICD-10-CM

## 2020-03-24 NOTE — Chronic Care Management (AMB) (Signed)
  Chronic Care Management   Outreach Note  03/24/2020 Name: [name] MRN: [id number] DOB: 1933/01/16  Referred by: [name], MD Reason for referral : Care Coordination   SW placed an unsuccessful outbound call to the patient to assist with ongoing care coordination needs. SW left a HIPAA compliant voice message requesting a return call.  Follow Up Plan: The care management team will reach out to the patient again over the next 10 days.   [name], BSW, CDP Social Worker, Certified Dementia Practitioner [facility] [phone]

## 2020-03-31 ENCOUNTER — Ambulatory Visit (INDEPENDENT_AMBULATORY_CARE_PROVIDER_SITE_OTHER): Payer: Medicare Other

## 2020-03-31 DIAGNOSIS — I5032 Chronic diastolic (congestive) heart failure: Secondary | ICD-10-CM

## 2020-03-31 DIAGNOSIS — Z170 Estrogen receptor positive status [ER+]: Secondary | ICD-10-CM

## 2020-03-31 DIAGNOSIS — E1165 Type 2 diabetes mellitus with hyperglycemia: Secondary | ICD-10-CM

## 2020-03-31 DIAGNOSIS — C50311 Malignant neoplasm of lower-inner quadrant of right female breast: Secondary | ICD-10-CM

## 2020-03-31 NOTE — Patient Instructions (Signed)
Social Worker Visit Information  Goals we discussed today:  Goals Addressed            This Visit's Progress   . Collaborate with RN Care Manager to perform appropriate assessments to assist with care management and care coordination needs       CARE PLAN ENTRY (see longitudinal plan of care for additional care plan information)  Current Barriers:  . Transportation . Limited access to caregiver . Inability to perform ADL's independently . Inability to perform IADL's independently . Physical decline directly impacted by DM II, CHF, and Malignant Neoplasm . Knowledge deficits related to management of chronic conditions   Social Work Clinical Goal(s):  [name] Over the next 10 days the patient will attend appointment with primary care provider as previously scheduled to address hyperglycemia . Over the next 30 days the patient will work with SW to review health plan benefits including transportation to and from physician appointments . Over the next 45 days the patient will work with primary care provider as well as care management team to better manage DM II as evidenced by improvement of self reported fasting blood sugars  CCM SW Interventions: Completed 03/31/20 . Inter-disciplinary care team collaboration (see longitudinal plan of care) . Performed chart review  o Noted contact with cardiology team 5/27 to report increased SOB on exertion with swelling to lower legs o Cardiology team instructed patient to increase Torsemide by 20mg  and contact office if problems persist . Successful outbound call placed to the patient who reports SOB has improved o Patient reports she feels the SOB was a result of catching a cold from her grand-children o Patient reports cold like symptoms are improving  . Discussed patient management of diabetes and goal to check CBG 3 times dails o Patient reports her fasting BS readings have been elevated for several days without known cause o Reported readings  ranging from 200-300 with this mornings FSBS over 300 o Patient denies symptoms of hyperglycemia or snacking throughout the night . Advised patient SW would collaborate with [name] regarding increase in readings . Reminded patient of upcomming appointment to see [name] on Thursday 6/10 o Patient reports she is not sure if she has secured transportation for this appointment o Provided date and time for the patient to discuss with her daughter, [name], who assists with transportation arrangements o Encouraged the patient to contact SW as needed for transportation resources . Patient reports continued engagement with [facility] to address Malignant Neoplasm o Patient continues with oral chemo. No nausea reported by the patient o Next appointment planned for July o Encouraged the patient to contact oncologist with concerns as needed prior to next appointment . Informed by the patient she is pureeing meat with meals due to concern with meat becoming stuck in her throat o Advised the patient to speak with [name] about this at [place] appointment . Collaboration with [name] and RN Care Manager to provide patient update  Patient Self Care Activities:  . Patient verbalizes understanding of plan to work with PCP and care management team to address ongoing care management needs . Calls provider office for new concerns or questions . Unable to perform ADLs independently . Unable to perform IADLs independently  Initial goal documentation        Follow Up Plan: SW will follow up with patient by phone over the next three weeks.   [name], BSW, CDP Social Worker, Certified Dementia Practitioner [facility] [place] [facility]  [phone]      

## 2020-03-31 NOTE — Chronic Care Management (AMB) (Signed)
Chronic Care Management    Social Work Follow Up Note  03/31/2020 Name: [name] MRN: [id number] DOB: May 09, 1933  [name] is a 86 y.o. year old female who is a primary care patient of [name], MD. The CCM team was consulted for assistance with care coordination.   Review of patient status, including review of consultants reports, other relevant assessments, and collaboration with appropriate care team members and the patient's provider was performed as part of comprehensive patient evaluation and provision of chronic care management services.    SDOH (Social Determinants of Health) assessments performed: No    Outpatient Encounter Medications as of 03/31/2020  Medication Sig   ACCU-CHEK AVIVA PLUS test strip USE AS DIRECTED TO CHECK BLOOD SUGAR FOUR TIMES DAILY   acetaminophen (TYLENOL) 500 MG tablet Take 1,000 mg by mouth every 6 (six) hours as needed for mild pain, moderate pain or headache.    amiodarone (PACERONE) 200 MG tablet Take 1 tablet (200 mg total) by mouth daily.   anastrozole (ARIMIDEX) 1 MG tablet TAKE 1 TABLET(1 MG) BY MOUTH DAILY   apixaban (ELIQUIS) 2.5 MG TABS tablet Take 1 tablet (2.5 mg total) by mouth 2 (two) times daily.   atorvastatin (LIPITOR) 20 MG tablet Take 1 tablet (20 mg total) by mouth at bedtime.   BD PEN NEEDLE NANO U/F 32G X 4 MM MISC Use as directed   calcium carbonate (OS-CAL) 600 MG TABS tablet Take 600 mg by mouth daily with breakfast.    carvedilol (COREG) 12.5 MG tablet TAKE 1 TABLET(12.5 MG) BY MOUTH TWICE DAILY   Cholecalciferol (VITAMIN D-3 PO) Take 1 capsule by mouth daily after breakfast.   ciprofloxacin (CIPRO) 250 MG tablet Take 1 tablet (250 mg total) by mouth 2 (two) times daily. (Patient not taking: Reported on 01/14/2020)   Cyanocobalamin (B-12 PO) Take 1 tablet by mouth daily after breakfast.   diclofenac sodium (VOLTAREN) 1 % GEL Apply 2 g topically 4 (four) times daily. (Patient taking differently: Apply 2 g  topically See admin instructions. Apply 2 grams to knees at bedtime and an additional 2 grams three times a day as needed for pain)   ferrous sulfate 325 (65 FE) MG tablet Take 325 mg by mouth daily with breakfast.   HUMALOG KWIKPEN 200 UNIT/ML KwikPen INJECT 3 UNITS UNDER THE SKIN BEFORE BREAKFAST, LUNCH, DINNER IF SUGARS ARE GREATER THAN 175.   insulin detemir (LEVEMIR) 100 UNIT/ML injection Take 16 units in the morning and 10 units in the evening.   isosorbide-hydrALAZINE (BIDIL) 20-37.5 MG tablet Take 1 tablet by mouth 3 (three) times daily.   LEVEMIR FLEXTOUCH 100 UNIT/ML FlexPen INJECT 30 UNITS UNDER THE SKIN EVERY NIGHT AT BEDTIME AS DIRECTED PER SLIDING SCALE PROTOCOL   magnesium oxide (MAG-OX) 400 MG tablet Take 400 mg by mouth daily.   metoprolol succinate (TOPROL-XL) 50 MG 24 hr tablet TAKE 1 TABLET BY MOUTH EVERY DAY   omeprazole (PRILOSEC) 40 MG capsule TAKE 1 CAPSULE BY MOUTH DAILY BEFORE A MEAL   oxybutynin (DITROPAN) 5 MG tablet TAKE 1 TABLET(5 MG) BY MOUTH TWICE DAILY   phenazopyridine (PYRIDIUM) 200 MG tablet One bid prn dysuria (Patient not taking: Reported on 01/14/2020)   potassium chloride SA (KLOR-CON) 20 MEQ tablet TAKE 2 TABLETS(40 MEQ) BY MOUTH DAILY   pregabalin (LYRICA) 75 MG capsule TAKE ONE CAPSULE BY MOUTH AT BEDTIME   senna-docusate (SENOKOT-S) 8.6-50 MG tablet Take 2 tablets by mouth at bedtime as needed for mild constipation.  torsemide (DEMADEX) 20 MG tablet Take 1 tablet (20 mg total) by mouth 2 (two) times daily. You may take an extra 20 mg tablet daily as needed for swelling   traMADol (ULTRAM) 50 MG tablet Take 1 tablet (50 mg total) by mouth 2 (two) times daily as needed.   No facility-administered encounter medications on file as of 03/31/2020.     Goals Addressed            This Visit's Progress    Collaborate with RN Care Manager to perform appropriate assessments to assist with care management and care coordination needs       CARE  PLAN ENTRY (see longitudinal plan of care for additional care plan information)  Current Barriers:   Transportation  Limited access to caregiver  Inability to perform ADL's independently  Inability to perform IADL's independently  Physical decline directly impacted by DM II, CHF, and Malignant Neoplasm  Knowledge deficits related to management of chronic conditions   Social Work Clinical Goal(s):   Over the next 10 days the patient will attend appointment with primary care provider as previously scheduled to address hyperglycemia  Over the next 30 days the patient will work with SW to review health plan benefits including transportation to and from physician appointments  Over the next 45 days the patient will work with primary care provider as well as care management team to better manage DM II as evidenced by improvement of self reported fasting blood sugars  CCM SW Interventions: Completed 03/31/20  Inter-disciplinary care team collaboration (see longitudinal plan of care)  Performed chart review  o Noted contact with cardiology team 5/27 to report increased SOB on exertion with swelling to lower legs o Cardiology team instructed patient to increase Torsemide by 20mg  and contact office if problems persist  Successful outbound call placed to the patient who reports SOB has improved o Patient reports she feels the SOB was a result of catching a cold from her grand-children o Patient reports cold like symptoms are improving   Discussed patient management of diabetes and goal to check CBG 3 times dails o Patient reports her fasting BS readings have been elevated for several days without known cause o Reported readings ranging from 200-300 with this mornings FSBS over 300 o Patient denies symptoms of hyperglycemia or snacking throughout the night  Advised patient SW would collaborate with [name] regarding increase in readings  Reminded patient of [place] appointment to  see [name] on Thursday 6/10 o Patient reports she is not sure if she has secured transportation for this appointment o Provided date and time for the patient to discuss with her daughter, [name], who assists with transportation arrangements o Encouraged the patient to contact SW as needed for transportation resources  Patient reports continued engagement with [facility] to address Malignant Neoplasm o Patient continues with oral chemo. No nausea reported by the patient o Next appointment planned for July o Encouraged the patient to contact oncologist with concerns as needed prior to next appointment  Informed by the patient she is pureeing meat with meals due to concern with meat becoming stuck in her throat o Advised the patient to speak with [name] about this at [place] appointment  Collaboration with [name] and [place] to provide patient update  Patient Self Care Activities:   Patient verbalizes understanding of plan to work with PCP and care management team to address ongoing care management needs  Calls provider office for new concerns  or questions  Unable to perform ADLs independently  Unable to perform IADLs independently  Initial goal documentation         Follow Up Plan: SW will follow up with patient by phone over the next three weeks.   [name], BSW, CDP Social Worker, Certified Dementia Practitioner [facility] [phone]  Total time spent performing care coordination and/or care management activities with the patient by phone or face to face = 31 minutes.

## 2020-04-01 ENCOUNTER — Ambulatory Visit: Payer: Self-pay

## 2020-04-01 DIAGNOSIS — E1165 Type 2 diabetes mellitus with hyperglycemia: Secondary | ICD-10-CM

## 2020-04-01 DIAGNOSIS — I5032 Chronic diastolic (congestive) heart failure: Secondary | ICD-10-CM

## 2020-04-01 NOTE — Chronic Care Management (AMB) (Signed)
  Chronic Care Management   Outreach Note  04/01/2020 Name: [name] MRN: [id number] DOB: 12/26/1932  Referred by: [name], MD Reason for referral : Care Coordination   SW placed a successful outbound call to the patient to confirm transportation arranged for upcomming PCP appointment on 6/10. Patient reports her daughter plans to provide transportation.  Follow Up Plan: SW will follow up with the patient over the next month as previously planned.  [name], BSW, CDP Social Worker, Certified Dementia Practitioner [facility] [phone]

## 2020-04-02 ENCOUNTER — Other Ambulatory Visit: Payer: Self-pay

## 2020-04-02 ENCOUNTER — Ambulatory Visit (INDEPENDENT_AMBULATORY_CARE_PROVIDER_SITE_OTHER): Payer: Medicare Other | Admitting: Internal Medicine

## 2020-04-02 ENCOUNTER — Encounter: Payer: Self-pay | Admitting: Internal Medicine

## 2020-04-02 ENCOUNTER — Ambulatory Visit (INDEPENDENT_AMBULATORY_CARE_PROVIDER_SITE_OTHER): Payer: Medicare Other

## 2020-04-02 VITALS — BP 148/70 | HR 67 | Temp 98.1°F | Ht 65.0 in | Wt 185.0 lb

## 2020-04-02 DIAGNOSIS — R269 Unspecified abnormalities of gait and mobility: Secondary | ICD-10-CM

## 2020-04-02 DIAGNOSIS — Z0000 Encounter for general adult medical examination without abnormal findings: Secondary | ICD-10-CM

## 2020-04-02 DIAGNOSIS — I130 Hypertensive heart and chronic kidney disease with heart failure and stage 1 through stage 4 chronic kidney disease, or unspecified chronic kidney disease: Secondary | ICD-10-CM | POA: Diagnosis not present

## 2020-04-02 DIAGNOSIS — E1165 Type 2 diabetes mellitus with hyperglycemia: Secondary | ICD-10-CM | POA: Diagnosis not present

## 2020-04-02 DIAGNOSIS — R2689 Other abnormalities of gait and mobility: Secondary | ICD-10-CM | POA: Diagnosis not present

## 2020-04-02 DIAGNOSIS — I5032 Chronic diastolic (congestive) heart failure: Secondary | ICD-10-CM

## 2020-04-02 MED ORDER — MIRTAZAPINE 7.5 MG PO TABS: 7.5000 mg | ORAL_TABLET | Freq: Every day | ORAL | 1 refills | Status: DC

## 2020-04-02 MED ORDER — CYANOCOBALAMIN 1000 MCG/ML IJ SOLN
1000.0000 ug | Freq: Once | INTRAMUSCULAR | Status: AC
  Administered 2020-04-02: 1000 ug via INTRAMUSCULAR

## 2020-04-02 NOTE — Progress Notes (Signed)
This visit occurred during the SARS-CoV-2 public health emergency.  Safety protocols were in place, including screening questions prior to the visit, additional usage of staff PPE, and extensive cleaning of exam room while observing appropriate contact time as indicated for disinfecting solutions.  Subjective:     Patient ID: NYGERIA LAGER , female    DOB: 1933/08/20 , 86 y.o.   MRN: [id number]   Chief Complaint  Patient presents with  . Diabetes  . Gait Problem    HPI  She presents today for DM check. She reports compliance with meds. She is accompanied by her daughter today.   Additionally, she is concerned about her ability to walk. She is seated in wheelchair for now. She would like to have physical therapy. She has been in contact with CCM nurse and Education officer, [facility]. She wants to have PT come to the home. Her daughter would like [facility] to come out.   Diabetes She presents for her follow-up diabetic visit. She has type 2 diabetes mellitus. Hypoglycemia symptoms include dizziness and nervousness/anxiousness. There are no diabetic associated symptoms. Risk factors for coronary artery disease include diabetes mellitus, dyslipidemia, obesity, hypertension, sedentary lifestyle and post-menopausal. She is following a diabetic diet. She never participates in exercise. Eye exam is not current.     Past Medical History:  Diagnosis Date  . Abnormal echocardiogram    a. possible mass on echo 05/2019 on [facility], b. no evidence of mass / atrial lead vegetation on follow up echo 06/2019  . Anemia   . Arthritis   . Cancer ([place])   . Cataract   . Chronic combined systolic and diastolic CHF (congestive heart failure) ([place])    a. Previously diastolic but patient AVS from outside hospital listed systolic CHF and cardiomyopathy, records pending.  . CKD (chronic kidney disease), stage III   . Clotting disorder ([place])   . CVA (cerebral vascular accident) ([place])    residual right sided weakness and mild  dysphagia  . Diabetes mellitus ([place])   . Dizziness and giddiness   . DVT (deep venous thrombosis) (H[facility])    a. on anticoagulation for this.  . Essential hypertension   . LBBB (left bundle branch block)   . Mitral regurgitation    a. Mod MR by echo 2014.  . Muscular deconditioning   . Obesity   . Pacemaker   . Pericardial effusion   . SVT (supraventricular tachycardia) ([place])    a. In 2013 she had an EPS with ablation for SVT which did not eliminate the SVT completely. She also had bradycardia which limited medication. She was placed on amiodarone by Dr. Lovena Le.  . Thrombocytopenia ([place]) 11/21/2011     Family History  Problem Relation Age of Onset  . Stroke Mother   . Cancer Father        prostate  . Hypertension Daughter   . Heart attack Brother        x2  . Hypertension Brother   . Stroke Sister   . Hypertension Sister   . Breast cancer Maternal Aunt      Current Outpatient Medications:  .  ACCU-CHEK AVIVA PLUS test strip, USE AS DIRECTED TO CHECK BLOOD SUGAR FOUR TIMES DAILY, Disp: 300 strip, Rfl: 2 .  acetaminophen (TYLENOL) 500 MG tablet, Take 1,000 mg by mouth every 6 (six) hours as needed for mild pain, moderate pain or headache. , Disp: , Rfl:  .  amiodarone (PACERONE) 200 MG tablet, Take 1 tablet (200 mg total)  by mouth daily., Disp: 90 tablet, Rfl: 3 .  anastrozole (ARIMIDEX) 1 MG tablet, TAKE 1 TABLET(1 MG) BY MOUTH DAILY, Disp: 90 tablet, Rfl: 0 .  apixaban (ELIQUIS) 2.5 MG TABS tablet, Take 1 tablet (2.5 mg total) by mouth 2 (two) times daily., Disp: 60 tablet, Rfl: 5 .  atorvastatin (LIPITOR) 20 MG tablet, Take 1 tablet (20 mg total) by mouth at bedtime., Disp: 90 tablet, Rfl: 2 .  BD PEN NEEDLE NANO U/F 32G X 4 MM MISC, Use as directed, Disp: 150 each, Rfl: 2 .  calcium carbonate (OS-CAL) 600 MG TABS tablet, Take 600 mg by mouth daily with breakfast.  (Patient not taking: Reported on 04/06/2020), Disp: , Rfl:  .  carvedilol (COREG) 12.5 MG tablet, TAKE 1  TABLET(12.5 MG) BY MOUTH TWICE DAILY, Disp: 180 tablet, Rfl: 2 .  Cholecalciferol (VITAMIN D-3 PO), Take 1 capsule by mouth daily after breakfast., Disp: , Rfl:  .  Cyanocobalamin (B-12 PO), Take 1 tablet by mouth daily after breakfast. (Patient not taking: Reported on 04/06/2020), Disp: , Rfl:  .  diclofenac sodium (VOLTAREN) 1 % GEL, Apply 2 g topically 4 (four) times daily. (Patient taking differently: Apply 2 g topically See admin instructions. Apply 2 grams to knees at bedtime and an additional 2 grams three times a day as needed for pain), Disp: 100 g, Rfl: 1 .  ferrous sulfate 325 (65 FE) MG tablet, Take 325 mg by mouth daily with breakfast., Disp: , Rfl:  .  HUMALOG KWIKPEN 200 UNIT/ML KwikPen, INJECT 3 UNITS UNDER THE SKIN BEFORE BREAKFAST, LUNCH, DINNER IF SUGARS ARE GREATER THAN 175., Disp: 45 mL, Rfl: 1 .  insulin detemir (LEVEMIR) 100 UNIT/ML injection, Take 16 units in the morning and 10 units in the evening., Disp: , Rfl:  .  isosorbide-hydrALAZINE (BIDIL) 20-37.5 MG tablet, Take 1 tablet by mouth 3 (three) times daily., Disp: 270 tablet, Rfl: 3 .  LEVEMIR FLEXTOUCH 100 UNIT/ML FlexPen, INJECT 30 UNITS UNDER THE SKIN EVERY NIGHT AT BEDTIME AS DIRECTED PER SLIDING SCALE PROTOCOL (Patient not taking: Reported on 04/06/2020), Disp: 15 mL, Rfl: 2 .  magnesium oxide (MAG-OX) 400 MG tablet, Take 400 mg by mouth daily. (Patient not taking: Reported on 04/06/2020), Disp: , Rfl:  .  metoprolol succinate (TOPROL-XL) 50 MG 24 hr tablet, TAKE 1 TABLET BY MOUTH EVERY DAY, Disp: 90 tablet, Rfl: 0 .  omeprazole (PRILOSEC) 40 MG capsule, TAKE 1 CAPSULE BY MOUTH DAILY BEFORE A MEAL, Disp: 90 capsule, Rfl: 1 .  oxybutynin (DITROPAN) 5 MG tablet, TAKE 1 TABLET(5 MG) BY MOUTH TWICE DAILY, Disp: 180 tablet, Rfl: 1 .  potassium chloride SA (KLOR-CON) 20 MEQ tablet, TAKE 2 TABLETS(40 MEQ) BY MOUTH DAILY, Disp: 180 tablet, Rfl: 1 .  senna-docusate (SENOKOT-S) 8.6-50 MG tablet, Take 2 tablets by mouth at bedtime as  needed for mild constipation., Disp:  , Rfl:  .  torsemide (DEMADEX) 20 MG tablet, Take 1 tablet (20 mg total) by mouth 2 (two) times daily. You may take an extra 20 mg tablet daily as needed for swelling, Disp: 180 tablet, Rfl: 2 .  Accu-Chek Softclix Lancets lancets, Use as instructed to check blood sugars 4 times per day dx: e11.65, Disp: 400 each, Rfl: 3 .  Blood Glucose Monitoring Suppl (ACCU-CHEK AVIVA PLUS) w/Device KIT, USE AS DIRECTED TO CHECK BLOOD SUGAR FOUR TIMES DAILY dx: e11.65, Disp: 1 kit, Rfl: 1 .  mirtazapine (REMERON) 7.5 MG tablet, Take 1 tablet (7.5 mg total) by mouth  at bedtime., Disp: 30 tablet, Rfl: 1 .  pregabalin (LYRICA) 75 MG capsule, TAKE ONE CAPSULE BY MOUTH AT BEDTIME, Disp: 90 capsule, Rfl: 1   Allergies  Allergen Reactions  . Aspirin Itching  . Penicillins Itching and Rash    DID THE REACTION INVOLVE: Swelling of the face/tongue/throat, SOB, or low BP? Yes Sudden or severe rash/hives, skin peeling, or the inside of the mouth or nose? No Did it require medical treatment? Yes When did it last happen?"More than 10 years ago" If all above answers are "NO", may proceed with cephalosporin use.      Review of Systems  Neurological: Positive for dizziness.  Psychiatric/Behavioral: The patient is nervous/anxious.      Today's Vitals   04/02/20 1126  BP: (!) 148/70  Pulse: 67  Temp: 98.1 F (36.7 C)  TempSrc: Oral  Weight: 185 lb (83.9 kg)  Height: '5\' 5"'  (1.651 m)   Body mass index is 30.79 kg/m.   Objective:  Physical Exam Vitals and nursing note reviewed.  Constitutional:      Appearance: Normal appearance. She is obese.  HENT:     Head: Normocephalic and atraumatic.  Cardiovascular:     Rate and Rhythm: Normal rate and regular rhythm.     Heart sounds: Normal heart sounds.  Pulmonary:     Effort: Pulmonary effort is normal.     Breath sounds: Normal breath sounds.  Musculoskeletal:     Right lower leg: 1+ Pitting Edema present.     Left  lower leg: 1+ Pitting Edema present.     Comments: r knee abnormality  Skin:    General: Skin is warm.  Neurological:     General: No focal deficit present.     Mental Status: She is alert.  Psychiatric:        Mood and Affect: Mood normal.        Behavior: Behavior normal.         Assessment And Plan:     1. Uncontrolled type 2 diabetes mellitus with hyperglycemia (HCC)  Chronic, I will check labs as listed below. I did review her home BS readings during her visit. I will make further recommendations once her labs are available for review. She is also encouraged to eat regular meals throughout the day to decrease risk of hypoglycemia.   - CMP14+EGFR - Hemoglobin A1c  2. Gait abnormality  I will place home health PT referral as requested.   - Vitamin B12 - Ambulatory referral to Home Health  3. 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  She will continue with current meds. She is encouraged to follow low sodium diet.   4. Chronic diastolic heart failure (HCC)  Chronic, yet stable.   5. Loss of balance  Again, PT referral placed. She was also given vitamin B12 IM x1.   - cyanocobalamin ((VITAMIN B-12)) injection 1,000 mcg - Ambulatory referral to Home Health      Maximino Greenland, MD    THE PATIENT IS ENCOURAGED TO PRACTICE SOCIAL DISTANCING DUE TO THE COVID-19 PANDEMIC.

## 2020-04-02 NOTE — Patient Instructions (Signed)
[name] , Thank you for taking time to come for your Medicare Wellness Visit. I appreciate your ongoing commitment to your health goals. Please review the following plan we discussed and let me know if I can assist you in the future.   Screening recommendations/referrals: Colonoscopy: not required Mammogram: 08/2019 Bone Density: previously ordered Recommended yearly ophthalmology/optometry visit for glaucoma screening and checkup Recommended yearly dental visit for hygiene and checkup  Vaccinations: Influenza vaccine: 07/2018 Pneumococcal vaccine: hold due to covid vaccine Tdap vaccine: 05/2013 Shingles vaccine: discussed    Advanced directives: Advance directive discussed with you today. I have provided a copy for you to complete at home and have notarized. Once this is complete please bring a copy in to our office so we can scan it into your chart.  Conditions/risks identified: obesity  Next appointment: 04/08/2021 at 11:00   Preventive Care 1 Years and Older, Female Preventive care refers to lifestyle choices and visits with your health care provider that can promote health and wellness. What does preventive care include?  A yearly physical exam. This is also called an annual well check.  Dental exams once or twice a year.  Routine eye exams. Ask your health care provider how often you should have your eyes checked.  Personal lifestyle choices, including:  Daily care of your teeth and gums.  Regular physical activity.  Eating a healthy diet.  Avoiding tobacco and drug use.  Limiting alcohol use.  Practicing safe sex.  Taking low-dose aspirin every day.  Taking vitamin and mineral supplements as recommended by your health care provider. What happens during an annual well check? The services and screenings done by your health care provider during your annual well check will depend on your age, overall health, lifestyle risk factors, and family history of  disease. Counseling  Your health care provider may ask you questions about your:  Alcohol use.  Tobacco use.  Drug use.  Emotional well-being.  Home and relationship well-being.  Sexual activity.  Eating habits.  History of falls.  Memory and ability to understand (cognition).  Work and work Statistician.  Reproductive health. Screening  You may have the following tests or measurements:  Height, weight, and BMI.  Blood pressure.  Lipid and cholesterol levels. These may be checked every 5 years, or more frequently if you are over 32 years old.  Skin check.  Lung cancer screening. You may have this screening every year starting at age 19 if you have a 30-pack-year history of smoking and currently smoke or have quit within the past 15 years.  Fecal occult blood test (FOBT) of the stool. You may have this test every year starting at age 71.  Flexible sigmoidoscopy or colonoscopy. You may have a sigmoidoscopy every 5 years or a colonoscopy every 10 years starting at age 44.  Hepatitis C blood test.  Hepatitis B blood test.  Sexually transmitted disease (STD) testing.  Diabetes screening. This is done by checking your blood sugar (glucose) after you have not eaten for a while (fasting). You may have this done every 1-3 years.  Bone density scan. This is done to screen for osteoporosis. You may have this done starting at age 53.  Mammogram. This may be done every 1-2 years. Talk to your health care provider about how often you should have regular mammograms. Talk with your health care provider about your test results, treatment options, and if necessary, the need for more tests. Vaccines  Your health care provider may recommend  certain vaccines, [name] as:  Influenza vaccine. This is recommended every year.  Tetanus, diphtheria, and acellular pertussis (Tdap, Td) vaccine. You may need a Td booster every 10 years.  Zoster vaccine. You may need this after age  52.  Pneumococcal 13-valent conjugate (PCV13) vaccine. One dose is recommended after age 28.  Pneumococcal polysaccharide (PPSV23) vaccine. One dose is recommended after age 13. Talk to your health care provider about which screenings and vaccines you need and how often you need them. This information is not intended to replace advice given to you by your health care provider. Make sure you discuss any questions you have with your health care provider. Document Released: 11/06/2015 Document Revised: 06/29/2016 Document Reviewed: 08/11/2015 Elsevier Interactive Patient Education  2017 [place] Prevention in the Home Falls can cause injuries. They can happen to people of all ages. There are many things you can do to make your home safe and to help prevent falls. What can I do on the outside of my home?  Regularly fix the edges of walkways and driveways and fix any cracks.  Remove anything that might make you trip as you walk through a door, [name] as a raised step or threshold.  Trim any bushes or trees on the path to your home.  Use bright outdoor lighting.  Clear any walking paths of anything that might make someone trip, [name] as rocks or tools.  Regularly check to see if handrails are loose or broken. Make sure that both sides of any steps have handrails.  Any raised decks and porches should have guardrails on the edges.  Have any leaves, snow, or ice cleared regularly.  Use sand or salt on walking paths during winter.  Clean up any spills in your garage right away. This includes oil or grease spills. What can I do in the bathroom?  Use night lights.  Install grab bars by the toilet and in the tub and shower. Do not use towel bars as grab bars.  Use non-skid mats or decals in the tub or shower.  If you need to sit down in the shower, use a plastic, non-slip stool.  Keep the floor dry. Clean up any water that spills on the floor as soon as it happens.  Remove  soap buildup in the tub or shower regularly.  Attach bath mats securely with double-sided non-slip rug tape.  Do not have throw rugs and other things on the floor that can make you trip. What can I do in the bedroom?  Use night lights.  Make sure that you have a light by your bed that is easy to reach.  Do not use any sheets or blankets that are too big for your bed. They should not hang down onto the floor.  Have a firm chair that has side arms. You can use this for support while you get dressed.  Do not have throw rugs and other things on the floor that can make you trip. What can I do in the kitchen?  Clean up any spills right away.  Avoid walking on wet floors.  Keep items that you use a lot in easy-to-reach places.  If you need to reach something above you, use a strong step stool that has a grab bar.  Keep electrical cords out of the way.  Do not use floor polish or wax that makes floors slippery. If you must use wax, use non-skid floor wax.  Do not have throw rugs and other  things on the floor that can make you trip. What can I do with my stairs?  Do not leave any items on the stairs.  Make sure that there are handrails on both sides of the stairs and use them. Fix handrails that are broken or loose. Make sure that handrails are as long as the stairways.  Check any carpeting to make sure that it is firmly attached to the stairs. Fix any carpet that is loose or worn.  Avoid having throw rugs at the top or bottom of the stairs. If you do have throw rugs, attach them to the floor with carpet tape.  Make sure that you have a light switch at the top of the stairs and the bottom of the stairs. If you do not have them, ask someone to add them for you. What else can I do to help prevent falls?  Wear shoes that:  Do not have high heels.  Have rubber bottoms.  Are comfortable and fit you well.  Are closed at the toe. Do not wear sandals.  If you use a  stepladder:  Make sure that it is fully opened. Do not climb a closed stepladder.  Make sure that both sides of the stepladder are locked into place.  Ask someone to hold it for you, if possible.  Clearly mark and make sure that you can see:  Any grab bars or handrails.  First and last steps.  Where the edge of each step is.  Use tools that help you move around (mobility aids) if they are needed. These include:  Canes.  Walkers.  Scooters.  Crutches.  Turn on the lights when you go into a dark area. Replace any light bulbs as soon as they burn out.  Set up your furniture so you have a clear path. Avoid moving your furniture around.  If any of your floors are uneven, fix them.  If there are any pets around you, be aware of where they are.  Review your medicines with your doctor. Some medicines can make you feel dizzy. This can increase your chance of falling. Ask your doctor what other things that you can do to help prevent falls. This information is not intended to replace advice given to you by your health care provider. Make sure you discuss any questions you have with your health care provider. Document Released: 08/06/2009 Document Revised: 03/17/2016 Document Reviewed: 11/14/2014 Elsevier Interactive Patient Education  2017 [employer].

## 2020-04-02 NOTE — Progress Notes (Signed)
This visit occurred during the SARS-CoV-2 public health emergency.  Safety protocols were in place, including screening questions prior to the visit, additional usage of staff PPE, and extensive cleaning of exam room while observing appropriate contact time as indicated for disinfecting solutions.  Subjective:   [name] is a 86 y.o. female who presents for Medicare Annual (Subsequent) preventive examination.  Review of Systems:  n/a Cardiac Risk Factors include: advanced age (>64mn, >>44women);diabetes mellitus;hypertension;obesity (BMI >30kg/m2);sedentary lifestyle     Objective:     Vitals: BP (!) 148/70   Pulse 67   Temp 98.1 F (36.7 C) (Oral)   Ht _0  (1.651 m)   Wt 185 lb (83.9 kg)   BMI 30.79 kg/m   Body mass index is 30.79 kg/m.  Advanced Directives 04/02/2020 08/11/2019 08/02/2019 06/08/2019 06/08/2019 04/02/2019 01/01/2019  Does Patient Have a Medical Advance Directive? _1  No No  Would patient like information on creating a medical advance directive? Yes (MAU/Ambulatory/Procedural Areas - Information given) No - Patient declined Yes (ED - Information included in AVS) No - Patient declined Yes (ED - Information included in AVS) - No - Patient declined  Pre-existing out of facility DNR order (yellow form or pink MOST form) - - - - - - -    Tobacco Social History   Tobacco Use  Smoking Status Never Smoker  Smokeless Tobacco Never Used     Counseling given: Not Answered   Clinical Intake:  Pre-visit preparation completed: Yes  Pain : No/denies pain     Nutritional Status: BMI > 30  Obese Nutritional Risks: None Diabetes: Yes  How often do you need to have someone help you when you read instructions, pamphlets, or other written materials from your doctor or pharmacy?: 1 - Never What is the last grade level you completed in school?: 12th grade  Interpreter Needed?: No  Information entered by :: NAllen LPN  Past Medical History:  Diagnosis  Date  . Abnormal echocardiogram    a. possible mass on echo 05/2019 on PPM, b. no evidence of mass / atrial lead vegetation on follow up echo 06/2019  . Anemia   . Arthritis   . Cancer ([facility]   . Cataract   . Chronic combined systolic and diastolic CHF (congestive heart failure) ([facility]    a. Previously diastolic but patient AVS from outside hospital listed systolic CHF and cardiomyopathy, records pending.  . CKD (chronic kidney disease), stage III   . Clotting disorder ([facility]   . CVA (cerebral vascular accident) ([facility]    residual right sided weakness and mild dysphagia  . Diabetes mellitus ([facility]   . Dizziness and giddiness   . DVT (deep venous thrombosis) (H[facility])    a. on anticoagulation for this.  . Essential hypertension   . LBBB (left bundle branch block)   . Mitral regurgitation    a. Mod MR by echo 2014.  . Muscular deconditioning   . Obesity   . Pacemaker   . Pericardial effusion   . SVT (supraventricular tachycardia) ([facility]    a. In 2013 she had an EPS with ablation for SVT which did not eliminate the SVT completely. She also had bradycardia which limited medication. She was placed on amiodarone by Dr. TLovena Le  . Thrombocytopenia ([facility] 11/21/2011   Past Surgical History:  Procedure Laterality Date  . EYE SURGERY    . SUPRAVENTRICULAR TACHYCARDIA ABLATION N/A 10/11/2012   Procedure: SUPRAVENTRICULAR TACHYCARDIA ABLATION;  Surgeon: GEvans Lance  MD;  Location: [facility]ATH [facility];  Service: Cardiovascular;  Laterality: N/A;   Family History  Problem Relation Age of Onset  . Stroke Mother   . Cancer Father        prostate  . Hypertension Daughter   . Heart attack Brother        x2  . Hypertension Brother   . Stroke Sister   . Hypertension Sister   . Breast cancer Maternal Aunt    Social History   Socioeconomic History  . Marital status: Widowed    Spouse name: Not on file  . Number of children: Not on file  . Years of education: Not on file  . Highest education level: Not  on file  Occupational History  . Occupation: retired  Tobacco Use  . Smoking status: Never Smoker  . Smokeless tobacco: Never Used  Vaping Use  . Vaping Use: Never used  Substance and Sexual Activity  . Alcohol use: No  . Drug use: No  . Sexual activity: Not Currently  Other Topics Concern  . Not on file  Social History Narrative   Lives at home with grandchild, daughter comes and help    Social Determinants of Health   Financial Resource Strain: Low Risk   . Difficulty of Paying Living Expenses: Not hard at all  Food Insecurity: No Food Insecurity  . Worried About Charity fundraiser in the Last Year: Never true  . Ran Out of Food in the Last Year: Never true  Transportation Needs: No Transportation Needs  . Lack of Transportation (Medical): No  . Lack of Transportation (Non-Medical): No  Physical Activity: Inactive  . Days of Exercise per Week: 0 days  . Minutes of Exercise per Session: 0 min  Stress: No Stress Concern Present  . Feeling of Stress : Only a little  Social Connections:   . Frequency of Communication with Friends and Family:   . Frequency of Social Gatherings with Friends and Family:   . Attends Religious Services:   . Active Member of Clubs or Organizations:   . Attends Archivist Meetings:   [name] Marital Status:     Outpatient Encounter Medications as of 04/02/2020  Medication Sig  . ACCU-CHEK AVIVA PLUS test strip USE AS DIRECTED TO CHECK BLOOD SUGAR FOUR TIMES DAILY  . acetaminophen (TYLENOL) 500 MG tablet Take 1,000 mg by mouth every 6 (six) hours as needed for mild pain, moderate pain or headache.   [name] amiodarone (PACERONE) 200 MG tablet Take 1 tablet (200 mg total) by mouth daily.  [name] anastrozole (ARIMIDEX) 1 MG tablet TAKE 1 TABLET(1 MG) BY MOUTH DAILY  . apixaban (ELIQUIS) 2.5 MG TABS tablet Take 1 tablet (2.5 mg total) by mouth 2 (two) times daily.  [name] atorvastatin (LIPITOR) 20 MG tablet Take 1 tablet (20 mg total) by mouth at bedtime.  .  BD PEN NEEDLE NANO U/F 32G X 4 MM MISC Use as directed  . calcium carbonate (OS-CAL) 600 MG TABS tablet Take 600 mg by mouth daily with breakfast.   . carvedilol (COREG) 12.5 MG tablet TAKE 1 TABLET(12.5 MG) BY MOUTH TWICE DAILY  . Cholecalciferol (VITAMIN D-3 PO) Take 1 capsule by mouth daily after breakfast.  . ciprofloxacin (CIPRO) 250 MG tablet Take 1 tablet (250 mg total) by mouth 2 (two) times daily. (Patient not taking: Reported on 04/02/2020)  . Cyanocobalamin (B-12 PO) Take 1 tablet by mouth daily after breakfast.  . diclofenac sodium (VOLTAREN) 1 % GEL  Apply 2 g topically 4 (four) times daily. (Patient taking differently: Apply 2 g topically See admin instructions. Apply 2 grams to knees at bedtime and an additional 2 grams three times a day as needed for pain)  . ferrous sulfate 325 (65 FE) MG tablet Take 325 mg by mouth daily with breakfast.  . HUMALOG KWIKPEN 200 UNIT/ML KwikPen INJECT 3 UNITS UNDER THE SKIN BEFORE BREAKFAST, LUNCH, DINNER IF SUGARS ARE GREATER THAN 175.  . insulin detemir (LEVEMIR) 100 UNIT/ML injection Take 16 units in the morning and 10 units in the evening.  . isosorbide-hydrALAZINE (BIDIL) 20-37.5 MG tablet Take 1 tablet by mouth 3 (three) times daily.  [name] LEVEMIR FLEXTOUCH 100 UNIT/ML FlexPen INJECT 30 UNITS UNDER THE SKIN EVERY NIGHT AT BEDTIME AS DIRECTED PER SLIDING SCALE PROTOCOL  . magnesium oxide (MAG-OX) 400 MG tablet Take 400 mg by mouth daily.  . metoprolol succinate (TOPROL-XL) 50 MG 24 hr tablet TAKE 1 TABLET BY MOUTH EVERY DAY  . omeprazole (PRILOSEC) 40 MG capsule TAKE 1 CAPSULE BY MOUTH DAILY BEFORE A MEAL  . oxybutynin (DITROPAN) 5 MG tablet TAKE 1 TABLET(5 MG) BY MOUTH TWICE DAILY  . phenazopyridine (PYRIDIUM) 200 MG tablet One bid prn dysuria  . potassium chloride SA (KLOR-CON) 20 MEQ tablet TAKE 2 TABLETS(40 MEQ) BY MOUTH DAILY  . pregabalin (LYRICA) 75 MG capsule TAKE ONE CAPSULE BY MOUTH AT BEDTIME  . senna-docusate (SENOKOT-S) 8.6-50 MG tablet  Take 2 tablets by mouth at bedtime as needed for mild constipation.  . torsemide (DEMADEX) 20 MG tablet Take 1 tablet (20 mg total) by mouth 2 (two) times daily. You may take an extra 20 mg tablet daily as needed for swelling  . [DISCONTINUED] traMADol (ULTRAM) 50 MG tablet Take 1 tablet (50 mg total) by mouth 2 (two) times daily as needed.   No facility-administered encounter medications on file as of 04/02/2020.    Activities of Daily Living In your present state of health, do you have any difficulty performing the following activities: 04/02/2020 08/11/2019  Hearing? Y N  Vision? Y N  Comment trouble out the left eye -  Difficulty concentrating or making decisions? Y N  Walking or climbing stairs? Y Y  Dressing or bathing? Y N  Comment daughter assists -  Doing errands, shopping? Aggie Moats  Comment daughter accompanies -  Conservation officer, nature and eating ? Y -  Comment daughter handles, she uses the microwave -  Using the Toilet? Y -  Comment daughter assists -  In the past six months, have you accidently leaked urine? Y -  Comment wears a pad -  Do you have problems with loss of bowel control? N -  Managing your Medications? Y -  Comment daughter sets up, but she knows what they are -  Managing your Finances? Y -  Housekeeping or managing your Housekeeping? Y -  Some recent data might be hidden    Patient Care Team: [name], MD as PCP - General Jettie Booze, MD as PCP - Cardiology (Cardiology) Excell Seltzer, MD (Inactive) as Consulting Physician (General Surgery) Nicholas Lose, MD as Consulting Physician (Hematology and Oncology) Kyung Rudd, MD as Consulting Physician (Radiation Oncology) [name] as Social Worker Little, Claudette Stapler, RN as Case Manager    Assessment:   This is a routine wellness examination for [name].  Exercise Activities and Dietary recommendations Current Exercise Habits: The patient does not participate in regular exercise at  present  Goals    .  "  I am having pain in my ankle" (pt-stated)      Current Barriers:  [name] Knowledge Deficits related to diagnosis and treatment of right ankle pain and swelling  Nurse Case Manager Clinical Goal(s):  [name] Over the next 30 days, patient will verbalize understanding of plan for evaluation and treatment of right ankle  CCM RN CM Interventions:  08/07/19 call completed with patients daughter [name]  . Received return voice message from daughter [name]; placed outbound call to dtr as requested . Evaluation of current treatment plan related to ankle pain and swelling and patient's adherence to plan as established by provider . Reinforced to daughter for patient to keep this extremity elevated while resting; use the pivot technique for transferring from W/C as directed by MD; discussed patient is wearing a soft boot and allowed partial weight with transfers, she is using her W/C at all times and her pain has improved . PT was placed on hold x 3 weeks but will resume next week in home . Discussed plans with patient for ongoing care management follow up and provided patient with direct contact information for care management team  Patient Self Care Activities:  . Self administers medications as prescribed . Attends all scheduled provider appointments . Calls pharmacy for medication refills . Performs ADL's independently . Performs IADL's independently . Calls provider office for new concerns or questions  Please see past updates related to this goal by clicking on the "Past Updates" button in the selected goal      .  "I check my sugars three times a day" (pt-stated)      Current Barriers:  [name] Knowledge Deficits related to disease process and Self Health management for Diabetes  Nurse Case Manager Clinical Goal(s):  [name] Over the next 30 days, patient will verbalize basic understanding of Diabetes disease process and self health management plan as evidenced by patient  will verbalize how to meal plan and follow a low carb Diabetic diet, monitor CBG's, take insulin exactly as prescribed and verbalize knowing when to call the doctor. Goal Met . 06/14/19 Over the next 30 days, patient will report having no hypo/hyperglycemic episodes; <80 and or >250 . 06/14/19 Over the next 90 days, patient will lower her A1C less than 7.8  CCM RN CM Interventions:  06/12/19 call completed with patient and daughter [name] . Evaluation of current treatment plan related to diabetes and patient's adherence to plan as established by provider. . Discussed plans with patient for ongoing care management follow up and provided patient with direct contact information for care management team . Provided patient with printed educational materials related to Diabetes Management Safety Tool . Advised patient, providing education and rationale, to check cbg before meals and record, calling the CCM team for findings outside established parameters.   . Patient reports adherence to following her prescribed treatment plan for her diabetes; reports no recent lows, today FBS 132; continues to monitor CBG before meals and is logging; patient verbalizes understanding as to when to call the CCM team and or provider if needed for hypo/hyperglycemic episodes  Patient Self Care Activities with assistance from daughter  Rosezena Sensor understanding of the education/information provided today  . Self administers medications as prescribed . Attends all scheduled provider appointments . Calls pharmacy for medication refills . Performs ADL's independently . Performs IADL's independently . Calls provider office for new concerns or questions  Please see past updates related to this goal by clicking on the "Past Updates" button in the  selected goal       .  "I want a second opinion about my cancer" (pt-stated)      Current Barriers:  Knowledge Deficits related to treatment management for malignant  neoplasm of lower-inner quadrant or right breast   Nurse Case Manager Clinical Goal(s):  [name] Over the next 30 days, patient will verbalize understanding of plan for diagnosis and treatment for malignant neoplasm of lower right breast. Goal Met . Over the next 30 days, patient will attend all scheduled medical appointments: including follow up at [facility] for 2nd opinion of breast neoplasm scheduled for 03/04/19. Goal Met . 06/14/19: Over the next 30 days, patient will have increased knowledge and understanding of the plan for surgical consultation of her right breast and lymph nodes secondary to malignancy.   CCM RN CM Interventions:  06/12/19 call completed with patient and daughter [name]  . Evaluation of current treatment plan related to diagnosis and treatment for right breast neoplasm and patient's adherence to plan as established by provider  Assessed for understanding of the prescribed treatment plan as recommended by Oncologist  Discussed plans with patient for ongoing care management follow up and provided patient with direct contact information for care management team  Per chart review, noted the following update; second opinion for R breast mass; completed at [facility]: April 02, 2019 per Ozella Rocks, MD  Right:     Spiculated mass with surrounding developing asymmetry and pleomorphic     calcifications in the lower central right breast at middle depth on     mammography, corresponding to large, heterogeneous, irregular, spiculated     mass on ultrasound, status post ultrasound guided biopsy with placement of     ribbon clip, consistent with biopsy-proven invasive ductal carcinoma  Left:     No mammographic evidence of malignancy.    BI-RADS Category:     Overall Assessment: 6 - Known Biopsy-Proven Malignancy    RECOMMENDATION:    Surgical Consultation is recommended for the right breast.    [place]      Patient Self Care Activities with  assistance from daughter . Self administers medications as prescribed . Attends all scheduled provider appointments . Calls pharmacy for medication refills . Performs ADL's independently . Performs IADL's independently . Calls provider office for new concerns or questions  Please see past updates related to this goal by clicking on the "Past Updates" button in the selected goal       .  "My back, neck and left arm pain has worsened" (pt-stated)      Current Barriers:  [name] Knowledge Deficits related to diagnosis and treatment management for cervical radiculopathy  Nurse Case Manager Clinical Goal(s):  [name] Over the next 30 days, patient will verbalize understanding of plan for long term treatment and or symptom management for cervical radiculopathy . Over the next 90 days, patient will not experience hospital admission. Hospital Admissions in last 6 months = 01/01/19; 06/08/19  CCM RN CM Interventions:  06/19/19 call completed with patient   . Evaluation of current treatment plan related to Cervical radiculopathy and associated pain and patient's adherence to plan as established by provider. . Reviewed medications with patient and discussed discussed patient completed a telephonic follow up visit with [name] on 06/18/19 at which time she reviewed her medications and was given the Okay to resume the Lisinopril prescribed by the hospitalist following her most recent IP admission/discharge; patient states she has all medications filled and in her home;  she verbalizes having a good understanding about the indication, dosage and frequency of each of her prescribed medications . Discussed plans with patient for ongoing care management follow up and provided patient with direct contact information for care management team . Provided patient with printed educational materials related to Medication management Safety Tool . Reviewed scheduled/upcoming provider appointments including: post discharge  follow up visits, including scheduled visits and recommendations for needed follow up visits . Discussed patient has started in home PT and is satisfied with the therapy she received with her initial PT visit . Encouraged patient to balance her activity with rest, stay well hydrated and take pain medication about 30 minutes before her PT visit to help optimize her ability to participate  Patient Self Care Activities with assistance of her daughter  . Self administers medications as prescribed . Attends all scheduled provider appointments . Calls pharmacy for medication refills . Performs ADL's independently . Performs IADL's independently . Calls provider office for new concerns or questions  Please see past updates related to this goal by clicking on the "Past Updates" button in the selected goal      .  Collaborate with RN Care Manager to perform appropriate assessments to assist with care management and care coordination needs      CARE PLAN ENTRY (see longitudinal plan of care for additional care plan information)  Current Barriers:  . Transportation . Limited access to caregiver . Inability to perform ADL's independently . Inability to perform IADL's independently . Physical decline directly impacted by DM II, CHF, and Malignant Neoplasm . Knowledge deficits related to management of chronic conditions   Social Work Clinical Goal(s):  [name] Over the next 10 days the patient will attend appointment with primary care provider as previously scheduled to address hyperglycemia . Over the next 30 days the patient will work with SW to review health plan benefits including transportation to and from physician appointments . Over the next 45 days the patient will work with primary care provider as well as care management team to better manage DM II as evidenced by improvement of self reported fasting blood sugars  CCM SW Interventions: Completed 03/31/20 . Inter-disciplinary care team  collaboration (see longitudinal plan of care) . Performed chart review  o Noted contact with cardiology team 5/27 to report increased SOB on exertion with swelling to lower legs o Cardiology team instructed patient to increase Torsemide by '20mg'$  and contact office if problems persist . Successful outbound call placed to the patient who reports SOB has improved o Patient reports she feels the SOB was a result of catching a cold from her grand-children o Patient reports cold like symptoms are improving  . Discussed patient management of diabetes and goal to check CBG 3 times dails o Patient reports her fasting BS readings have been elevated for several days without known cause o Reported readings ranging from 200-300 with this mornings FSBS over 300 o Patient denies symptoms of hyperglycemia or snacking throughout the night . Advised patient SW would collaborate with [name] regarding increase in readings . Reminded patient of upcomming appointment to see [name] on Thursday 6/10 o Patient reports she is not sure if she has secured transportation for this appointment o Provided date and time for the patient to discuss with her daughter, [name], who assists with transportation arrangements o Encouraged the patient to contact SW as needed for transportation resources . Patient reports continued engagement with [facility] to address Malignant Neoplasm o Patient  continues with oral chemo. No nausea reported by the patient o Next appointment planned for July o Encouraged the patient to contact oncologist with concerns as needed prior to next appointment . Informed by the patient she is pureeing meat with meals due to concern with meat becoming stuck in her throat o Advised the patient to speak with [name] about this at [place] appointment . Collaboration with [name] and RN Care Manager to provide patient update  Patient Self Care Activities:  . Patient verbalizes  understanding of plan to work with PCP and care management team to address ongoing care management needs . Calls provider office for new concerns or questions . Unable to perform ADLs independently . Unable to perform IADLs independently  Initial goal documentation     .  Patient Stated      Wants to get better     .  Patient Stated      04/02/2020, wants to start walking and driving again    .  Marland KitchenHigh Risk for Knowledge Deficit for CHF       Current Barriers:  [name] Knowledge Deficits related to disease process and Self Health management for CHF   Nurse Case Manager Clinical Goal(s):  [name] Over the next 60 days, patient will verbalize basic understanding of CHF disease process and self health management plan as evidenced by patient and caregiver will verbalize better understanding of the disease process for CHF and will verbalize increased understanding of MD recommendations for CHF disease management. 04/04/19 re-established goal date to 60 days due to treatment delays secondary to Corona virus Goal Met . New - 07/05/19 Over the next 60 days, patient will experience no IP or ED events secondary to exacerbation of CHF.  CCM RN CM Interventions:  08/07/19 completed call with patients daughter [name]   . Evaluation of current treatment plan related to CHF and patient's adherence to plan as established by provider. . Provided education to daughter re: importance of performing daily weights each am after voiding and wearing the smallest amount of clothing; discussed recording weights daily when patient able to resume full body weight . Discussed when to call the doctor for weight gain of 3 lbs in 1 day and or 5 lbs in 1 week . Discussed other s/s such as new or worsening fatigue, dry cough, shortness of breath, swelling to face, hands, abdomen and lower extremities, nausea/vomiting with abdominal swelling   Discussed plans with patient for ongoing care management follow up and provided  patient with direct contact information for care management team   Patient Self Care Activities:  . Attends all scheduled provider appointments . Calls pharmacy for medication refills . Calls provider office for new concerns or questions  Please see past updates related to this goal by clicking on the "Past Updates" button in the selected goal          Fall Risk Fall Risk  04/02/2020 04/02/2020 05/21/2019 04/02/2019 03/21/2019  Falls in the past year? 0 0 0 0 0  Comment - - - - -  Number falls in past yr: - 0 - - -  Injury with Fall? - 0 - - -  Risk for fall due to : Medication side effect;Impaired mobility;Impaired balance/gait - - Impaired vision;Medication side effect;Impaired mobility;Impaired balance/gait -  Follow up Falls evaluation completed;Education provided;Falls prevention discussed - - Falls prevention discussed -   Is the patient's home free of loose throw rugs in walkways, pet beds, electrical cords, etc?  yes      Grab bars in the bathroom? yes      Handrails on the stairs?   n/a      Adequate lighting?   yes  Timed Get Up and Go performed: n/a  Depression Screen PHQ 2/9 Scores 04/02/2020 04/02/2020 05/21/2019 04/02/2019  PHQ - 2 Score 1 1 0 0  PHQ- 9 Score 9 - - 1     Cognitive Function     6CIT Screen 04/02/2020 04/02/2019  What Year? 0 points 0 points  What month? 0 points 0 points  What time? 3 points 0 points  Count back from 20 0 points 0 points  Months in reverse 0 points 0 points  Repeat phrase 6 points 0 points  Total Score 9 0    Immunization History  Administered Date(s) Administered  . DTaP 12/13/2012  . Influenza, High Dose Seasonal PF 08/15/2018  . Pneumococcal-Unspecified 04/04/2016    Qualifies for Shingles Vaccine? yes  Screening Tests Health Maintenance  Topic Date Due  . FOOT EXAM  Never done  . OPHTHALMOLOGY EXAM  Never done  . COVID-19 Vaccine (1) Never done  . DEXA SCAN  Never done  . URINE MICROALBUMIN  05/20/2020  . PNA vac  Low Risk Adult (2 of 2 - PCV13) 04/02/2021 (Originally 04/04/2017)  . INFLUENZA VACCINE  05/24/2020  . HEMOGLOBIN A1C  10/02/2020  . TETANUS/TDAP  06/13/2023    Cancer Screenings: Lung: Low Dose CT Chest recommended if Age 49-80 years, 30 pack-year currently smoking OR have quit w/in 15years. Patient does not qualify. Breast:  Up to date on Mammogram? Yes   Up to date of Bone Density/Dexa? Yes Colorectal: not required  Additional Screenings: : Hepatitis C Screening: n/a     Plan:    Patient wants to start walking and driving again.   I have personally reviewed and noted the following in the patient's chart:   . Medical and social history . Use of alcohol, tobacco or illicit drugs  . Current medications and supplements . Functional ability and status . Nutritional status . Physical activity . Advanced directives . List of other physicians . Hospitalizations, surgeries, and ER visits in previous 12 months . Vitals . Screenings to include cognitive, depression, and falls . Referrals and appointments  In addition, I have reviewed and discussed with patient certain preventive protocols, quality metrics, and best practice recommendations. A written personalized care plan for preventive services as well as general preventive health recommendations were provided to patient.     Kellie Simmering, LPN  06/10/4036

## 2020-04-03 ENCOUNTER — Ambulatory Visit: Payer: Self-pay

## 2020-04-03 ENCOUNTER — Telehealth: Payer: Self-pay

## 2020-04-03 DIAGNOSIS — I5032 Chronic diastolic (congestive) heart failure: Secondary | ICD-10-CM

## 2020-04-03 DIAGNOSIS — I130 Hypertensive heart and chronic kidney disease with heart failure and stage 1 through stage 4 chronic kidney disease, or unspecified chronic kidney disease: Secondary | ICD-10-CM

## 2020-04-03 DIAGNOSIS — Z170 Estrogen receptor positive status [ER+]: Secondary | ICD-10-CM | POA: Diagnosis not present

## 2020-04-03 DIAGNOSIS — R269 Unspecified abnormalities of gait and mobility: Secondary | ICD-10-CM

## 2020-04-03 DIAGNOSIS — E1165 Type 2 diabetes mellitus with hyperglycemia: Secondary | ICD-10-CM

## 2020-04-03 DIAGNOSIS — C50311 Malignant neoplasm of lower-inner quadrant of right female breast: Secondary | ICD-10-CM | POA: Diagnosis not present

## 2020-04-03 LAB — CMP14+EGFR
ALT: 24 IU/L (ref 0–32)
AST: 27 IU/L (ref 0–40)
Albumin/Globulin Ratio: 1.2 (ref 1.2–2.2)
Albumin: 4.1 g/dL (ref 3.6–4.6)
Alkaline Phosphatase: 96 IU/L (ref 48–121)
BUN/Creatinine Ratio: 16 (ref 12–28)
BUN: 30 mg/dL — ABNORMAL HIGH (ref 8–27)
Bilirubin Total: 0.2 mg/dL (ref 0.0–1.2)
CO2: 26 mmol/L (ref 20–29)
Calcium: 9.3 mg/dL (ref 8.7–10.3)
Chloride: 102 mmol/L (ref 96–106)
Creatinine, Ser: 1.84 mg/dL — ABNORMAL HIGH (ref 0.57–1.00)
GFR calc Af Amer: 28 mL/min/{1.73_m2} — ABNORMAL LOW (ref >59)
GFR calc non Af Amer: 25 mL/min/{1.73_m2} — ABNORMAL LOW (ref >59)
Globulin, Total: 3.3 g/dL (ref 1.5–4.5)
Glucose: 57 mg/dL — ABNORMAL LOW (ref 65–99)
Potassium: 4.1 mmol/L (ref 3.5–5.2)
Sodium: 143 mmol/L (ref 134–144)
Total Protein: 7.4 g/dL (ref 6.0–8.5)

## 2020-04-03 LAB — HEMOGLOBIN A1C
Est. average glucose Bld gHb Est-mCnc: 209 mg/dL
Hgb A1c MFr Bld: 8.9 % — ABNORMAL HIGH (ref 4.8–5.6)

## 2020-04-03 LAB — VITAMIN B12: Vitamin B-12: >2000 pg/mL — ABNORMAL HIGH (ref 232–1245)

## 2020-04-04 ENCOUNTER — Other Ambulatory Visit: Payer: Self-pay | Admitting: Internal Medicine

## 2020-04-06 ENCOUNTER — Telehealth: Payer: Self-pay

## 2020-04-06 ENCOUNTER — Telehealth: Payer: Self-pay | Admitting: Internal Medicine

## 2020-04-06 ENCOUNTER — Other Ambulatory Visit: Payer: Self-pay

## 2020-04-06 ENCOUNTER — Other Ambulatory Visit: Payer: Self-pay | Admitting: Internal Medicine

## 2020-04-06 MED ORDER — ACCU-CHEK SOFTCLIX LANCETS MISC: 3 refills | Status: DC

## 2020-04-06 MED ORDER — ACCU-CHEK AVIVA PLUS W/DEVICE KIT: PACK | 1 refills | Status: DC

## 2020-04-06 NOTE — Chronic Care Management (AMB) (Signed)
  Chronic Care Management   Note  04/06/2020 Name: [name] MRN: [id number] DOB: September 11, 1933  [name] is a 86 y.o. year old female who is a primary care patient of [name], MD. [name] is currently enrolled in care management services. An additional referral for Pharm D was placed.   Follow up plan: Unsuccessful telephone outreach attempt made. A HIPPA compliant phone message was left for the patient providing contact information and requesting a return call.  The care management team will reach out to the patient again over the next 7 days.  If patient returns call to provider office, please advise to call Embedded Care Management Care Guide Glenna Durand LPN at [phone]  Amare Bail, LPN Health Advisor, [facility] ??[email or web address] ??[phone]

## 2020-04-06 NOTE — Telephone Encounter (Signed)
-----   Message from [name], MD sent at 04/04/2020  3:38 PM EDT ----- Here are your lab results:  Your kidney function has decreased slightly. Be sure to take fluid pills only as directed.   Your hba1c is 8.9, this is higher than what it should be. You did mention not having a good appetite, do you think you need a medication to help you with this?   Your vitamin B12 level is elevated. Are you taking supplements? If yes, you can stop at this time.   Please let me know if you have any questions or concerns. Stay safe!   Sincerely,    Robyn N. Baird Cancer, MD

## 2020-04-07 NOTE — Patient Instructions (Signed)
Visit Information  Goals Addressed      Patient Stated   .  "I am having pain in my ankle" (pt-stated)   On track     Current Barriers:  [name] Knowledge Deficits related to diagnosis and treatment of right ankle pain and swelling . 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 New 04/06/20 Over the next 30 days, patient will have received her orthotic foot brace and will verbalize she is wearing this brace as directed  CCM RN CM Interventions:  04/06/20 call completed with patient and daughter [name] . Evaluation of current treatment plan related to ankle pain and swelling and patient's adherence to plan as established by provider . Determined patient completed her PT and the ankle fracture has healed; Determined she continues to suffer from bilateral knee pain and it was recommended for her to wear an orthotic knee brace to help correct her leg rotation . Determined patient completed an Ortho visit at [facility] on 01/10/20 with Dr. Rodell Perna with the following Assessment/Plan noted; o Assessment & Plan: o Visit Diagnoses:  o 1. 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  With all her medical problems she is not really a candidate for total knee arthroplasty.  Prescription for brace given for knee brace with metal upright.  In the past when she tried a knee immobilizer she states that road up on her she had problems taking it on and off.  She principally applies the knee sleeve when she stands or transfers.  Follow-up here as needed. o Follow-Up Instructions: No follow-ups on file.  o Orders:  o  o  o  Orders Placed This Encounter o  Procedures o  . o XR KNEE 3  VIEW RIGHT o    . Determined she was fitted for an orthotic knee brace and will be notified once it is ready . Assessed for falls and DME needs, patient denies having falls, is using a cane and walker to help with ambulation  . Discussed plans with patient for ongoing care management follow up and provided patient with direct contact information for care management team  Patient Self Care Activities:  . Self administers medications as prescribed . Attends all scheduled provider appointments . Calls pharmacy for medication refills . Performs ADL's independently . Performs IADL's independently . Calls provider office for new concerns or questions  Please see past updates related to this goal by clicking on the "Past Updates" button in the selected goal      .  "I check my sugars three times a day" (pt-stated)   Not on track     Current Barriers:  [name] Knowledge Deficits related to disease process and Self Health management for Diabetes  Nurse Case Manager Clinical Goal(s):  [name] Over the next 30 days, patient will verbalize basic understanding of Diabetes disease process and self health management plan as evidenced by patient will verbalize how to meal plan and follow a low carb Diabetic diet, monitor CBG's, take insulin exactly as prescribed and verbalize knowing when to call the doctor. Goal Met . 06/14/19 Over the next 30 days, patient will report having no hypo/hyperglycemic episodes; <80 and  or >250 Goal Met  . 06/14/19 Over the next 90 days, patient will lower her A1C less than 7.8 Goal Not Met  . New 04/06/20 Over the next 60 days, patient will verbalize modifying her diet to better adhere to ADA diet recommendations to help improve her daily glycemic control and lower her A1c . New 04/06/20 Over the next 90 days, patient will lower her A1c <8.0 %   CCM RN CM Interventions:  04/06/20 call completed with patient and daughter [name] . Evaluation of current treatment plan related to  diabetes and patient's adherence to plan as established by provider . Reviewed and discussed patient's A1c is up to 8.9 from 7.9 obtained on 04/02/20; Determined patient needs a new glucometer, in basket message sent to [name]; Determined patient reports adherence to following her prescribed treatment plan for her diabetes, although she admits she can do better with her diet and is willing to eat better; reports no recent lows, continues to monitor CBG before meals and is logging; reports FBS are ranging in the 200's some days; patient verbalizes understanding as to when to call the CCM team and or provider if needed for hypo/hyperglycemic episodes . Education patient on daily glycemic control FBS 80-130, <180 after meals; educated on target A1c <7.0; Educated on 15'15' rule  . Reviewed medications with patient including DM regimen, patient has a good understanding, reports adherence; Sent embedded Pharm D referral for medication review and for improved  management of polypharmacy  . Advised patient, providing education and rationale, to continue to check FBS before meals and record, calling the CCM team for findings outside established parameters . Mailed printed educational materials related to Diabetes management using the Plate Method, Grocery Shopping with Diabetes; Diabetes Care Guide; Diabetes and Kidney disease; Carb Counting, Carb Choices . Discussed plans with patient for ongoing care management follow up and provided patient with direct contact information for care management team  Patient Self Care Activities with assistance from daughter  Rosezena Sensor understanding of the education/information provided today  . Self administers medications as prescribed . Attends all scheduled provider appointments . Calls pharmacy for medication refills . Performs ADL's independently . Performs IADL's independently . Calls provider office for new concerns or questions  Please see past updates  related to this goal by clicking on the "Past Updates" button in the selected goal       .  "I want a second opinion about my cancer" (pt-stated)   On track     Current Barriers:  [name] Knowledge Deficits related to treatment management for malignant neoplasm of lower-inner quadrant or right breast  . 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 Goal Met . Over the next 30 days, patient will attend all scheduled medical appointments: including follow up at [facility] for 2nd opinion of breast neoplasm scheduled for 03/04/19. Goal Met . 06/14/19: Over the next 30 days, patient will have increased knowledge and understanding of the plan for surgical consultation of her right breast and lymph nodes secondary to malignancy Goal Met  . New 04/06/20 Over the next 90 days, patient will verbalize increased knowledge and understanding about her Cancer diagnosis, prognosis and potential complications that may occur as a result of her  treatment and or disease process   CCM RN CM Interventions:  04/06/20 call completed with patient and daughter [name] . Evaluation of current treatment plan related to diagnosis and treatment for right breast neoplasm and patient's adherence to plan as established by provider  Determined patient completed an Oncology visit with Dr. Lindi Adie from [facility] on 11/12/19   Reviewed and discussed the following Assessment/Plan noted;   ASSESSMENT & PLAN:   Malignant neoplasm of lower-inner quadrant of right breast of female, estrogen receptor positive ([place])  11/19/2018:Palpable right breast mass with calcifications, mammogram revealed focal asymmetry  lower right breast 5:00 measuring 4.6 cm, ultrasound revealed overall size of 5.7 cm ultrasound-guided biopsy revealed grade 2 IDC ER 90% PR 80% Ki-67 5%, HER-2 negative, T3N0 stage IIa clinical stage  Treatment plan:  1.  Neoadjuvant antiestrogen therapy with anastrozole started 11/28/2018  2.  Patient went to [facility] and they felt that she could undergo surgery.  She was seen by Dr. Donne Hazel recently.    Mammogram and ultrasound 09/06/2019: Decrease in size of the mass from 4.6 cm to 0.5 cm, no axillary lymph nodes  We discussed different options and patient is leaning towards watchful monitoring on hormonal therapy  Anastrozole toxicities: hair loss  RTC in 6 months with Bil mammograms and ultrasound of Rt breast  The patient has a good understanding of the overall plan. she agrees with it. she will call with any problems that may develop before the next visit here  Reviewed and discussed next OV with Dr. Lindi Adie is scheduled for 05/11/20 _0 :00 AM  Discussed plans with patient for ongoing care management follow up and provided patient with direct contact information for care management team  Patient Self Care Activities with assistance from daughter . Self administers medications as prescribed . Attends all scheduled provider appointments . Calls pharmacy for medication refills . Performs ADL's independently . Performs IADL's independently . Calls provider office for new concerns or questions  Please see past updates related to this goal by clicking on the "Past Updates" button in the selected goal       .  "I would like to improve my kidney function" (pt-stated)   Not on track     Malcolm (see longitudinal plan of care for additional care plan information)  Current Barriers:  [name] Knowledge Deficits related to disease process and Self Health management of CKD . Chronic Disease Management support and education needs related to Hypertensive heart and renal disease  with heart failure, Type 2 DM, CHF, Malignant neoplasm  Nurse Case Manager Clinical Goal(s):  [name] Over the next 90 days, patient will work with the CCM team and PCP to address needs related to disease education and support to improve Self Health management of CKD   CCM RN CM Interventions:  04/06/20 call completed with patient  . Inter-disciplinary care team collaboration (see longitudinal plan of care) . Evaluation of current treatment plan related to CKD and patient's adherence to plan as established by provider. . Provided education to patient re: GFR from recent blood draw; Educated on the stages of CKD and how to improve renal function with better DM and BP control, maintaining ideal body weight, drinking plenty of fluids, staying active by implementing a routine exercise regimen as tolerated  . Discussed plans with patient for ongoing care management follow up and provided patient with direct contact information for care management team . Provided patient with printed educational materials related to  stages of CKD   Patient Self Care Activities:  . Self administers medications as prescribed . Attends all scheduled provider appointments . Calls pharmacy for medication refills . Calls provider office for new concerns or questions . Supportive daughter to assist with care needs  Initial goal documentation     .  COMPLETED: "My back, neck and left arm pain has worsened" (pt-stated)        Current Barriers:  [name] Knowledge Deficits related to diagnosis and treatment management for cervical radiculopathy  Nurse Case Manager Clinical Goal(s):  [name] Over the next 30 days, patient will verbalize understanding of plan for long term treatment and or symptom management for cervical radiculopathy . Over the next 90 days, patient will not experience hospital admission. Hospital Admissions in last 6 months = 01/01/19; 06/08/19  CCM RN CM Interventions:  04/06/20 call completed with patient and  daughter [name] . Evaluation of current treatment plan related to Cervical radiculopathy and associated pain and patient's adherence to plan as established by provider . Determined patient completed in home PT, she adheres to following a HEP on good days . Determined patient continues to be followed and established with Dr. Lorin Mercy and is following his recommendations  . Discussed plans with patient for ongoing care management follow up and provided patient with direct contact information for care management team  Patient Self Care Activities with assistance of her daughter  . Self administers medications as prescribed . Attends all scheduled provider appointments . Calls pharmacy for medication refills . Performs ADL's independently . Performs IADL's independently . Calls provider office for new concerns or questions  Please see past updates related to this goal by clicking on the "Past Updates" button in the selected goal        Other   .  Marland KitchenHigh Risk for Knowledge Deficit for CHF    On track     Current Barriers:  [name] Knowledge Deficits related to disease process and Self Health management for CHF  . 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 04/04/19 re-established goal date to 60 days due to treatment delays secondary to Corona virus - Goal Met  . New - 07/05/19 Over the next 60 days, patient will experience no IP or ED events secondary to exacerbation of CHF Goal Met  . New 04/06/20 Over the next 30 days, patient will  increase her adherence to obtaining daily weights and recording them as directed . New 04/06/20 Over the next 90 days, patient will contact her health care team to report symptoms suggestive of CHF promptly, as directed to help improve Self Health management of CHF w/o disease exacerbation  CCM RN CM Interventions:  04/06/20 completed call with patient and daughter [name]  . Evaluation of current treatment plan related to CHF and patient's adherence to plan as established by provider . Provided education to patient and daughter re: importance of performing daily weights each am after voiding and wearing the smallest amount of clothing; discussed recording weights daily  . Discussed when to call the doctor for weight gain of 3 lbs  in 1 day and or 5 lbs in 1 week . Reviewed and discussed other s/s suggestive of CHF exacerbation and when to call the doctor; new or worsening fatigue, dry cough, shortness of breath, swelling to face, hands, abdomen and lower extremities, nausea/vomiting with abdominal swelling  . Reviewed and discussed Cardiology visit completed on 01/14/20 with Dr. Irish Lack with the following Assessment/Plan completed;  o ASSESSMENT AND PLAN: 1. Chronic diastolic heart failure: Can increase torsemide to take an extra pill when she feels increased leg swelling. 2. Hypertensive heart disease: Adequately controlled.  3. Lower extremity edema: Elevate legs.  Compression stockings being used.  4. 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 5. Anticoagulated: No bleeding issues.  Now on lower dose Eliquis. 6. Pacemaker, there was a concern for a lead infection.  She was seen by Dr. Lovena Le.  Pacer was functioning normally.  Due to lack of symptoms of infection, no TEE was done.  No fevers or chills at this time. 7. COVID vaccine recommended. o Current medicines are reviewed at length with the patient today.  The patient concerns regarding her medicines were  addressed. o The following changes have been made:ok to take increased torsemide o Labs/ tests ordered today include:  o Recommend 150 minutes/week of aerobic exercise o Low fat, low carb, high fiber diet recommended o Disposition:   FU in 1 year  Reviewed and discussed the following reported symptoms reported by patient to Cardiology on 03/12/20 and the recommendations provided;   patient reported that she has been getting more SOB this week. She states that it only occurs with exertion. She has not been up and moving around as often and gets SOB when wheeling herself around in her wheelchair. She denies any chest pain. She states that she does have some swelling in her legs. Her daughter states that the swelling has not increased. She reports that she does elevate her legs and use her compression hose.   I advised the patient that she could take an extra 72m of torsemide daily for increased swelling  Discussed plans with patient for ongoing care management follow up and provided patient with direct contact information for care management team  Mailed printed educational material to patient related to CHF zone safety tool  Patient Self Care Activities:  . Attends all scheduled provider appointments . Calls pharmacy for medication refills . Calls provider office for new concerns or questions . Supportive daughter to assist with care needs  Please see past updates related to this goal by clicking on the "Past Updates" button in the selected goal        Patient verbalizes understanding of instructions provided today.   Telephone follow up appointment with care management team member scheduled for: 05/12/20  ABarb Merino RN, BSN, CCM Care Management Coordinator [facility]  Direct Phone: [phone]

## 2020-04-07 NOTE — Chronic Care Management (AMB) (Signed)
  Chronic Care Management   Note  04/07/2020 Name: [name] MRN: [id number] DOB: [birth year]  [name] is a 86 y.o. year old female who is a primary care patient of [name], MD. [name] is currently enrolled in care management services. An additional referral for Pharm D was placed.   Follow up plan: Telephone appointment with care management team member scheduled for:05/04/2020  Glenna Durand, LPN Health Advisor, [facility] ??[email or web address] ??[phone]

## 2020-04-07 NOTE — Chronic Care Management (AMB) (Signed)
Chronic Care Management   Follow Up Note   04/06/2020 Name: [name] MRN: [id number] DOB: 04-28-1933  Referred by: Stephanie Chard, MD Reason for referral : Chronic Care Management (FU RN CM Call )   [name] is a 86 y.o. year old female who is a primary care patient of Stephanie Chard, MD. The CCM team was consulted for assistance with chronic disease management and care coordination needs.    Review of patient status, including review of consultants reports, relevant laboratory and other test results, and collaboration with appropriate care team members and the patient's provider was performed as part of comprehensive patient evaluation and provision of chronic care management services.    SDOH (Social Determinants of Health) assessments performed: Yes - no acute needs See Care Plan activities for detailed interventions related to [place])   Placed outbound follow up call to patient and daughter [name] for a CCM RN CM care plan update.     Outpatient Encounter Medications as of 04/03/2020  Medication Sig Note  . ACCU-CHEK AVIVA PLUS test strip USE AS DIRECTED TO CHECK BLOOD SUGAR FOUR TIMES DAILY   . acetaminophen (TYLENOL) 500 MG tablet Take 1,000 mg by mouth every 6 (six) hours as needed for mild pain, moderate pain or headache.    [name] amiodarone (PACERONE) 200 MG tablet Take 1 tablet (200 mg total) by mouth daily.   [name] anastrozole (ARIMIDEX) 1 MG tablet TAKE 1 TABLET(1 MG) BY MOUTH DAILY   . apixaban (ELIQUIS) 2.5 MG TABS tablet Take 1 tablet (2.5 mg total) by mouth 2 (two) times daily.   [name] atorvastatin (LIPITOR) 20 MG tablet Take 1 tablet (20 mg total) by mouth at bedtime.   . BD PEN NEEDLE NANO U/F 32G X 4 MM MISC Use as directed   . carvedilol (COREG) 12.5 MG tablet TAKE 1 TABLET(12.5 MG) BY MOUTH TWICE DAILY   . Cholecalciferol (VITAMIN D-3 PO) Take 1 capsule by mouth daily after breakfast.   . diclofenac sodium (VOLTAREN) 1 % GEL Apply 2 g topically 4 (four) times  daily. (Patient taking differently: Apply 2 g topically See admin instructions. Apply 2 grams to knees at bedtime and an additional 2 grams three times a day as needed for pain)   . ferrous sulfate 325 (65 FE) MG tablet Take 325 mg by mouth daily with breakfast.   . HUMALOG KWIKPEN 200 UNIT/ML KwikPen INJECT 3 UNITS UNDER THE SKIN BEFORE BREAKFAST, LUNCH, DINNER IF SUGARS ARE GREATER THAN 175.   . insulin detemir (LEVEMIR) 100 UNIT/ML injection Take 16 units in the morning and 10 units in the evening.   . isosorbide-hydrALAZINE (BIDIL) 20-37.5 MG tablet Take 1 tablet by mouth 3 (three) times daily.   . metoprolol succinate (TOPROL-XL) 50 MG 24 hr tablet TAKE 1 TABLET BY MOUTH EVERY DAY   . mirtazapine (REMERON) 7.5 MG tablet Take 1 tablet (7.5 mg total) by mouth at bedtime.   [name] omeprazole (PRILOSEC) 40 MG capsule TAKE 1 CAPSULE BY MOUTH DAILY BEFORE A MEAL   . oxybutynin (DITROPAN) 5 MG tablet TAKE 1 TABLET(5 MG) BY MOUTH TWICE DAILY   . potassium chloride SA (KLOR-CON) 20 MEQ tablet TAKE 2 TABLETS(40 MEQ) BY MOUTH DAILY   . pregabalin (LYRICA) 75 MG capsule TAKE ONE CAPSULE BY MOUTH AT BEDTIME   . senna-docusate (SENOKOT-S) 8.6-50 MG tablet Take 2 tablets by mouth at bedtime as needed for mild constipation.   . torsemide (DEMADEX) 20 MG tablet Take 1 tablet (  20 mg total) by mouth 2 (two) times daily. You may take an extra 20 mg tablet daily as needed for swelling   . calcium carbonate (OS-CAL) 600 MG TABS tablet Take 600 mg by mouth daily with breakfast.  (Patient not taking: Reported on 04/06/2020)   . Cyanocobalamin (B-12 PO) Take 1 tablet by mouth daily after breakfast. (Patient not taking: Reported on 04/06/2020) 04/06/2020: Patient instructed to hold Cyanocobalamin per [name] for elevated B12.   [name] LEVEMIR FLEXTOUCH 100 UNIT/ML FlexPen INJECT 30 UNITS UNDER THE SKIN EVERY NIGHT AT BEDTIME AS DIRECTED PER SLIDING SCALE PROTOCOL (Patient not taking: Reported on 04/06/2020)   . magnesium oxide  (MAG-OX) 400 MG tablet Take 400 mg by mouth daily. (Patient not taking: Reported on 04/06/2020) 04/06/2020: Patient ran out of this medication but plans to refill and restart.   . [DISCONTINUED] ciprofloxacin (CIPRO) 250 MG tablet Take 1 tablet (250 mg total) by mouth 2 (two) times daily. (Patient not taking: Reported on 04/02/2020)   . [DISCONTINUED] phenazopyridine (PYRIDIUM) 200 MG tablet One bid prn dysuria   . [DISCONTINUED] pregabalin (LYRICA) 75 MG capsule TAKE ONE CAPSULE BY MOUTH AT BEDTIME    No facility-administered encounter medications on file as of 04/03/2020.     Objective:  Lab Results  Component Value Date   HGBA1C 8.9 (H) 04/02/2020   HGBA1C 7.9 (H) 06/08/2019   HGBA1C 8.4 (H) 01/02/2019   Lab Results  Component Value Date   MICROALBUR 150 05/21/2019   LDLCALC 96 08/15/2018   CREATININE 1.84 (H) 04/02/2020   BP Readings from Last 3 Encounters:  04/02/20 (!) 148/70  04/02/20 (!) 148/70  01/14/20 (!) 144/80    Goals Addressed      Patient Stated   .  "I am having pain in my ankle" (pt-stated)   On track     Current Barriers:  [name] Knowledge Deficits related to diagnosis and treatment of right ankle pain and swelling . 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 New 04/06/20 Over the next 30 days, patient will have received her orthotic foot brace and will verbalize she is wearing this brace as directed  CCM RN CM Interventions:  04/06/20 call completed with patient and daughter [name] . Evaluation of current treatment plan related to ankle pain and swelling and patient's adherence to plan as established by provider . Determined patient completed her PT and the ankle fracture has  healed; Determined she continues to suffer from bilateral knee pain and it was recommended for her to wear an orthotic knee brace to help correct her leg rotation . Determined patient completed an Ortho visit at [facility] on 01/10/20 with Dr. Rodell Perna with the following Assessment/Plan noted; o Assessment & Plan: o Visit Diagnoses:  o 1. 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  With all her medical problems she is not really a candidate for total knee arthroplasty.  Prescription for brace given for knee brace with metal upright.  In the past when she tried a knee immobilizer she states that road up on her she had problems taking it on and off.  She principally applies the knee sleeve when she  stands or transfers.  Follow-up here as needed. o Follow-Up Instructions: No follow-ups on file.  o Orders:  o  o  o  Orders Placed This Encounter o  Procedures o  . o XR KNEE 3 VIEW RIGHT o    . Determined she was fitted for an orthotic knee brace and will be notified once it is ready . Assessed for falls and DME needs, patient denies having falls, is using a cane and walker to help with ambulation  . Discussed plans with patient for ongoing care management follow up and provided patient with direct contact information for care management team  Patient Self Care Activities:  . Self administers medications as prescribed . Attends all scheduled provider appointments . Calls pharmacy for medication refills . Performs ADL's independently . Performs IADL's independently . Calls provider office for new concerns or questions  Please see past updates related to this goal by clicking on the "Past Updates" button in the selected goal      .  "I check my sugars three times a day" (pt-stated)   Not on track     Current Barriers:  [name] Knowledge Deficits related to disease process and Self Health management for Diabetes  Nurse Case Manager Clinical Goal(s):    [name] Over the next 30 days, patient will verbalize basic understanding of Diabetes disease process and self health management plan as evidenced by patient will verbalize how to meal plan and follow a low carb Diabetic diet, monitor CBG's, take insulin exactly as prescribed and verbalize knowing when to call the doctor. Goal Met . 06/14/19 Over the next 30 days, patient will report having no hypo/hyperglycemic episodes; <80 and or >250 Goal Met  . 06/14/19 Over the next 90 days, patient will lower her A1C less than 7.8 Goal Not Met  . New 04/06/20 Over the next 60 days, patient will verbalize modifying her diet to better adhere to ADA diet recommendations to help improve her daily glycemic control and lower her A1c . New 04/06/20 Over the next 90 days, patient will lower her A1c <8.0 %   CCM RN CM Interventions:  04/06/20 call completed with patient and daughter [name] . Evaluation of current treatment plan related to diabetes and patient's adherence to plan as established by provider . Reviewed and discussed patient's A1c is up to 8.9 from 7.9 obtained on 04/02/20; Determined patient needs a new glucometer, in basket message sent to [name]; Determined patient reports adherence to following her prescribed treatment plan for her diabetes, although she admits she can do better with her diet and is willing to eat better; reports no recent lows, continues to monitor CBG before meals and is logging; reports FBS are ranging in the 200's some days; patient verbalizes understanding as to when to call the CCM team and or provider if needed for hypo/hyperglycemic episodes . Education patient on daily glycemic control FBS 80-130, <180 after meals; educated on target A1c <7.0; Educated on 15'15' rule  . Reviewed medications with patient including DM regimen, patient has a good understanding, reports adherence; Sent embedded Pharm D referral for medication review and for improved  management of polypharmacy   . Advised patient, providing education and rationale, to continue to check FBS before meals and record, calling the CCM team for findings outside established parameters . Mailed printed educational materials related to Diabetes management using the Plate Method, Grocery Shopping with Diabetes; Diabetes Care Guide; Diabetes and Kidney disease; Carb Counting, Carb Choices . Discussed plans  with patient for ongoing care management follow up and provided patient with direct contact information for care management team  Patient Self Care Activities with assistance from daughter  Rosezena Sensor understanding of the education/information provided today  . Self administers medications as prescribed . Attends all scheduled provider appointments . Calls pharmacy for medication refills . Performs ADL's independently . Performs IADL's independently . Calls provider office for new concerns or questions  Please see past updates related to this goal by clicking on the "Past Updates" button in the selected goal     .  "I want a second opinion about my cancer" (pt-stated)   On track     Current Barriers:  [name] Knowledge Deficits related to treatment management for malignant neoplasm of lower-inner quadrant or right breast  . 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 Goal Met . Over the next 30 days, patient will attend all scheduled medical appointments: including follow up at [facility] for 2nd opinion of breast neoplasm scheduled for 03/04/19. Goal Met . 06/14/19: Over the next 30 days, patient will have increased knowledge and understanding of the plan for surgical consultation of her right breast and lymph nodes  secondary to malignancy Goal Met  . New 04/06/20 Over the next 90 days, patient will verbalize increased knowledge and understanding about her Cancer diagnosis, prognosis and potential complications that may occur as a result of her treatment and or disease process   CCM RN CM Interventions:  04/06/20 call completed with patient and daughter [name] . Evaluation of current treatment plan related to diagnosis and treatment for right breast neoplasm and patient's adherence to plan as established by provider  Determined patient completed an Oncology visit with Dr. Lindi Adie from [facility] on 11/12/19   Reviewed and discussed the following Assessment/Plan noted;   ASSESSMENT & PLAN:   Malignant neoplasm of lower-inner quadrant of right breast of female, estrogen receptor positive ([place])  11/19/2018:Palpable right breast mass with calcifications, mammogram revealed focal asymmetry lower right breast 5:00 measuring 4.6 cm, ultrasound revealed overall size of 5.7 cm ultrasound-guided biopsy revealed grade 2 IDC ER 90% PR 80% Ki-67 5%, HER-2 negative, T3N0 stage IIa clinical stage  Treatment plan:  1.  Neoadjuvant antiestrogen therapy with anastrozole started 11/28/2018  2.  Patient went to [facility] and they felt that she could undergo surgery.  She was seen by Dr. Donne Hazel recently.    Mammogram and ultrasound 09/06/2019: Decrease in size of the mass from 4.6 cm to 0.5 cm, no axillary lymph nodes  We discussed different options and patient is leaning towards watchful monitoring on hormonal therapy  Anastrozole toxicities: hair loss  RTC in 6 months with Bil mammograms and ultrasound of Rt breast  The patient has a good understanding of the overall plan. she agrees with it. she will call with any problems that may develop before the next visit here  Reviewed and discussed next OV with Dr. Lindi Adie is scheduled for 05/11/20 @11 :00 AM  Discussed plans with  patient for ongoing care management follow up and provided patient with direct contact information for care management team  Patient [place] with assistance from daughter . Self administers medications as prescribed . Attends  all scheduled provider appointments . Calls pharmacy for medication refills . Performs ADL's independently . Performs IADL's independently . Calls provider office for new concerns or questions  Please see past updates related to this goal by clicking on the "Past Updates" button in the selected goal       .  "I would like to improve my kidney function" (pt-stated)   Not on track     [place] (see longitudinal plan of care for additional care plan information)  Current Barriers:  [name] Knowledge Deficits related to disease process and Self Health management of CKD . 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 Inter-disciplinary care team collaboration (see longitudinal plan of care) . Evaluation of current treatment plan related to CKD and patient's adherence to plan as established by provider. . Provided education to patient re: GFR from recent blood draw; Educated on the stages of CKD and how to improve renal function with better DM and BP control, maintaining ideal body weight, drinking plenty of fluids, staying active by implementing a routine exercise regimen as tolerated  . Discussed plans with patient for ongoing care management follow up and provided patient with direct contact information for care management team . Provided patient with printed educational  materials related to stages of CKD   Patient Self Care Activities:  . Self administers medications as prescribed . Attends all scheduled provider appointments . Calls pharmacy for medication refills . Calls provider office for new concerns or questions . Supportive daughter to assist with care needs  Initial goal documentation     .  COMPLETED: "My back, neck and left arm pain has worsened" (pt-stated)        Current Barriers:  [name] Knowledge Deficits related to diagnosis and treatment management for cervical radiculopathy  Nurse Case Manager Clinical Goal(s):  [name] Over the next 30 days, patient will verbalize understanding of plan for long term treatment and or symptom management for cervical radiculopathy . Over the next 90 days, patient will not experience hospital admission. Hospital Admissions in last 6 months = 01/01/19; 06/08/19  CCM RN CM Interventions:  04/06/20 call completed with patient and daughter [name] . Evaluation of current treatment plan related to Cervical radiculopathy and associated pain and patient's adherence to plan as established by provider . Determined patient completed in home PT, she adheres to following a HEP on good days . Determined patient continues to be followed and established with Dr. Lorin Mercy and is following his recommendations  . Discussed plans with patient for ongoing care management follow up and provided patient with direct contact information for care management team  Patient Self Care Activities with assistance of her daughter  . Self administers medications as prescribed . Attends all scheduled provider appointments . Calls pharmacy for medication refills . Performs ADL's independently . Performs IADL's independently . Calls provider office for new concerns or questions  Please see past updates related to this goal by clicking on the "Past Updates" button in the selected goal        Other   .  Marland KitchenHigh Risk for Knowledge Deficit  for CHF     On track     Current Barriers:  [name] Knowledge Deficits related to disease process and Self Health management for CHF  . 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 04/04/19 re-established goal date to 60 days due to treatment delays secondary to Corona virus - Goal Met  . New - 07/05/19 Over the next 60 days, patient will experience no IP or ED events secondary to exacerbation of CHF Goal Met  . New 04/06/20 Over the next 30 days, patient will increase her adherence to obtaining daily weights and recording them as directed . New 04/06/20 Over the next 90 days, patient will contact her health care team to report symptoms suggestive of CHF promptly, as directed to help improve Self Health management of CHF w/o disease exacerbation  CCM RN CM Interventions:  04/06/20 completed call with patient and daughter [name]  . Evaluation of current treatment plan related to CHF and patient's adherence to plan as established by provider . Provided education to patient and daughter re: importance of performing daily weights each am after voiding and wearing the smallest amount of clothing; discussed recording weights daily  . Discussed when to call the doctor for weight gain of 3 lbs in 1 day and or 5 lbs in 1 week . Reviewed and discussed other s/s suggestive of CHF exacerbation and when to call the doctor; new or worsening fatigue, dry cough, shortness of breath, swelling to face, hands, abdomen and lower extremities, nausea/vomiting  with abdominal swelling  . Reviewed and discussed Cardiology visit completed on 01/14/20 with Dr. Irish Lack with the following Assessment/Plan completed;  o ASSESSMENT AND PLAN: 1. Chronic diastolic heart failure: Can increase torsemide to take an extra pill when she feels increased leg swelling. 2. Hypertensive heart disease: Adequately controlled.  3. Lower extremity edema: Elevate legs.  Compression stockings being used.  4. 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 5. Anticoagulated: No bleeding issues.  Now on lower dose Eliquis. 6. Pacemaker, there was a concern for a lead infection.  She was seen by Dr. Lovena Le.  Pacer was functioning normally.  Due to lack of symptoms of infection, no TEE was done.  No fevers or chills at this time. 7. COVID vaccine recommended. o Current medicines are reviewed at length with the patient today.  The patient concerns regarding her medicines were addressed. o The following changes have been made:ok to take increased torsemide o Labs/ tests ordered today include:  o Recommend 150 minutes/week of aerobic exercise o Low fat, low carb, high fiber diet recommended o Disposition:   FU in 1 year  Reviewed and discussed the following reported symptoms reported by patient to Cardiology on 03/12/20 and the recommendations provided;   patient reported that she has been getting more SOB this week. She states that it only occurs with exertion. She has not been up and moving around as often and gets SOB when wheeling herself around in her wheelchair. She denies any chest pain. She states that she does have some swelling in her legs.  Her daughter states that the swelling has not increased. She reports that she does elevate her legs and use her compression hose.   I advised the patient that she could take an extra 22m of torsemide daily for increased swelling  Discussed plans with patient for ongoing care management follow up and provided  patient with direct contact information for care management team  Mailed printed educational material to patient related to CHF zone safety tool  Patient Self Care Activities:  . Attends all scheduled provider appointments . Calls pharmacy for medication refills . Calls provider office for new concerns or questions . Supportive daughter to assist with care needs  Please see past updates related to this goal by clicking on the "Past Updates" button in the selected goal        Plan:   Telephone follow up appointment with care management team member scheduled for: 05/12/20  ABarb Merino RN, BSN, CCM Care Management Coordinator [email or web address]  Direct Phone: [phone]

## 2020-04-08 ENCOUNTER — Ambulatory Visit: Payer: Self-pay

## 2020-04-08 ENCOUNTER — Telehealth: Payer: Self-pay

## 2020-04-08 DIAGNOSIS — E1165 Type 2 diabetes mellitus with hyperglycemia: Secondary | ICD-10-CM

## 2020-04-08 DIAGNOSIS — I130 Hypertensive heart and chronic kidney disease with heart failure and stage 1 through stage 4 chronic kidney disease, or unspecified chronic kidney disease: Secondary | ICD-10-CM

## 2020-04-08 NOTE — Chronic Care Management (AMB) (Signed)
  Chronic Care Management   Outreach Note  04/08/2020 Name: [name] MRN: [id number] DOB: Jan 15, 1933  Referred by: [name], MD Reason for referral : Care Coordination   SW placed an unsuccessful outbound call to the patient to assist with care coordination needs. SW spoke with the patients daughter, [name], whom reports the patient is currently resting.   Follow Up Plan: SW will follow up with the patient over the next 14 days.  [name], BSW, CDP Social Worker, Certified Dementia Practitioner [place] [facility] [phone]

## 2020-04-09 ENCOUNTER — Telehealth: Payer: Self-pay

## 2020-04-09 NOTE — Telephone Encounter (Signed)
LEFT MESSAGE FOR PT TO RETURN CALL FOR LAB RESULTS

## 2020-04-09 NOTE — Telephone Encounter (Signed)
-----   Message from [name], MD sent at 04/04/2020  3:38 PM EDT ----- Here are your lab results:  Your kidney function has decreased slightly. Be sure to take fluid pills only as directed.   Your hba1c is 8.9, this is higher than what it should be. You did mention not having a good appetite, do you think you need a medication to help you with this?   Your vitamin B12 level is elevated. Are you taking supplements? If yes, you can stop at this time.   Please let me know if you have any questions or concerns. Stay safe!   Sincerely,    Robyn N. Baird Cancer, MD

## 2020-04-14 ENCOUNTER — Other Ambulatory Visit: Payer: Self-pay | Admitting: Internal Medicine

## 2020-04-16 ENCOUNTER — Other Ambulatory Visit: Payer: Self-pay | Admitting: Internal Medicine

## 2020-04-17 ENCOUNTER — Ambulatory Visit: Payer: Self-pay

## 2020-04-17 DIAGNOSIS — I5032 Chronic diastolic (congestive) heart failure: Secondary | ICD-10-CM

## 2020-04-17 DIAGNOSIS — E1165 Type 2 diabetes mellitus with hyperglycemia: Secondary | ICD-10-CM

## 2020-04-17 NOTE — Patient Instructions (Signed)
Social Worker Visit Information  Goals we discussed today:  Goals Addressed            This Visit's Progress   . Collaborate with RN Care Manager to perform appropriate assessments to assist with care management and care coordination needs       CARE PLAN ENTRY (see longitudinal plan of care for additional care plan information)  Current Barriers:  . Transportation . Limited access to caregiver . Inability to perform ADL's independently . Inability to perform IADL's independently . Physical decline directly impacted by DM II, CHF, and Malignant Neoplasm . Knowledge deficits related to management of chronic conditions   Social Work Clinical Goal(s):  [name] Over the next 10 days the patient will attend appointment with primary care provider as previously scheduled to address hyperglycemia Goal Met . Over the next 30 days the patient will work with SW to review health plan benefits including transportation to and from physician appointments Goal Met . Over the next 45 days the patient will work with primary care provider as well as care management team to better manage DM II as evidenced by improvement of self reported fasting blood sugars  CCM SW Interventions: Completed 04/17/20 . Successful outbound call placed to the patient to assess for goal progression . Confirmed patient attended OV with [name] . Reviewed patient transportation benefit offered under health plan o Confirmed patient knowledge how to contact health plan to arrange transportation for future needs o Reminded patient of need to arrange transportation a minimum of 3 business days prior to appointment . Determined the patient is continuing to have difficulty managing blood sugar o Chart reviewed to note [place] PharmD call scheduled for 7/12 and outreach by [place] scheduled for 7/20 o Advised the patient of [place] appointments and plans for both clinicians to assist with disease management . Scheduled  follow up call over the next 6 weeks to assist with ongoing care coordination needs  Completed 03/31/20 . Inter-disciplinary care team collaboration (see longitudinal plan of care) . Performed chart review  o Noted contact with cardiology team 5/27 to report increased SOB on exertion with swelling to lower legs o Cardiology team instructed patient to increase Torsemide by 19m and contact office if problems persist . Successful outbound call placed to the patient who reports SOB has improved o Patient reports she feels the SOB was a result of catching a cold from her grand-children o Patient reports cold like symptoms are improving  . Discussed patient management of diabetes and goal to check CBG 3 times dails o Patient reports her fasting BS readings have been elevated for several days without known cause o Reported readings ranging from 200-300 with this mornings FSBS over 300 o Patient denies symptoms of hyperglycemia or snacking throughout the night . Advised patient SW would collaborate with Dr. SBaird Cancerregarding increase in readings . Reminded patient of upcomming appointment to see Dr. SBaird Canceron Thursday 6/10 o Patient reports she is not sure if she has secured transportation for this appointment o Provided date and time for the patient to discuss with her daughter, BMliss Sax who assists with transportation arrangements o Encouraged the patient to contact SW as needed for transportation resources . Patient reports continued [facility] o Patient continues with oral chemo. No nausea reported by the patient o Next appointment planned for July o Encouraged the patient to contact oncologist with concerns as needed prior to next appointment . Informed by  the patient she is pureeing meat with meals due to concern with meat becoming stuck in her throat o Advised the patient to speak with [name] about this at [place]  appointment . Collaboration with [name] and RN Care Manager to provide patient update  Patient Self Care Activities:  . Patient verbalizes understanding of plan to work with PCP and care management team to address ongoing care management needs . Calls provider office for new concerns or questions . Unable to perform ADLs independently . Unable to perform IADLs independently  Please see past updates related to this goal by clicking on the "Past Updates" button in the selected goal          Materials Provided: Verbal education about healthplan benefits provided by phone  Follow Up Plan: SW will follow up with patient by phone over the next 6 weeks.  [name], BSW, CDP Social Worker, Certified Dementia Practitioner [place] [facility] [place] [facility] [phone]

## 2020-04-17 NOTE — Chronic Care Management (AMB) (Signed)
Chronic Care Management    Social Work Follow Up Note  04/17/2020 Name: [name] MRN: [id number] DOB: 05-26-1933  [name] is a 86 y.o. year old female who is a primary care patient of [name], MD. The CCM team was consulted for assistance with care coordination.   Review of patient status, including review of consultants reports, other relevant assessments, and collaboration with appropriate care team members and the patient's provider was performed as part of comprehensive patient evaluation and provision of chronic care management services.    SDOH (Social Determinants of Health) assessments performed: No    Outpatient Encounter Medications as of 04/17/2020  Medication Sig Note  . ACCU-CHEK AVIVA PLUS test strip USE AS DIRECTED TO CHECK BLOOD SUGAR FOUR TIMES DAILY   . Accu-Chek Softclix Lancets lancets Use as instructed to check blood sugars 4 times per day dx: e11.65   . acetaminophen (TYLENOL) 500 MG tablet Take 1,000 mg by mouth every 6 (six) hours as needed for mild pain, moderate pain or headache.    [name] amiodarone (PACERONE) 200 MG tablet Take 1 tablet (200 mg total) by mouth daily.   [name] anastrozole (ARIMIDEX) 1 MG tablet TAKE 1 TABLET(1 MG) BY MOUTH DAILY   . apixaban (ELIQUIS) 2.5 MG TABS tablet Take 1 tablet (2.5 mg total) by mouth 2 (two) times daily.   [name] atorvastatin (LIPITOR) 20 MG tablet Take 1 tablet (20 mg total) by mouth at bedtime.   . BD PEN NEEDLE NANO 2ND GEN 32G X 4 MM MISC USE AS DIRECTED   . Blood Glucose Monitoring Suppl (ACCU-CHEK AVIVA PLUS) w/Device KIT USE AS DIRECTED TO CHECK BLOOD SUGAR FOUR TIMES DAILY dx: e11.65   . calcium carbonate (OS-CAL) 600 MG TABS tablet Take 600 mg by mouth daily with breakfast.  (Patient not taking: Reported on 04/06/2020)   . carvedilol (COREG) 12.5 MG tablet TAKE 1 TABLET(12.5 MG) BY MOUTH TWICE DAILY   . Cholecalciferol (VITAMIN D-3 PO) Take 1 capsule by mouth daily after breakfast.   . Cyanocobalamin (B-12 PO) Take  1 tablet by mouth daily after breakfast. (Patient not taking: Reported on 04/06/2020) 04/06/2020: Patient instructed to hold Cyanocobalamin per [name] for elevated B12.   . diclofenac sodium (VOLTAREN) 1 % GEL Apply 2 g topically 4 (four) times daily. (Patient taking differently: Apply 2 g topically See admin instructions. Apply 2 grams to knees at bedtime and an additional 2 grams three times a day as needed for pain)   . ferrous sulfate 325 (65 FE) MG tablet Take 325 mg by mouth daily with breakfast.   . HUMALOG KWIKPEN 200 UNIT/ML KwikPen INJECT 3 UNITS UNDER THE SKIN BEFORE BREAKFAST, LUNCH, DINNER IF SUGARS ARE GREATER THAN 175.   . insulin detemir (LEVEMIR) 100 UNIT/ML injection Take 16 units in the morning and 10 units in the evening.   . isosorbide-hydrALAZINE (BIDIL) 20-37.5 MG tablet Take 1 tablet by mouth 3 (three) times daily.   [name] LEVEMIR FLEXTOUCH 100 UNIT/ML FlexPen INJECT 30 UNITS UNDER THE SKIN EVERY NIGHT AT BEDTIME AS DIRECTED PER SLIDING SCALE PROTOCOL (Patient not taking: Reported on 04/06/2020)   . magnesium oxide (MAG-OX) 400 MG tablet Take 400 mg by mouth daily. (Patient not taking: Reported on 04/06/2020) 04/06/2020: Patient ran out of this medication but plans to refill and restart.   . metoprolol succinate (TOPROL-XL) 50 MG 24 hr tablet TAKE 1 TABLET BY MOUTH EVERY DAY   . mirtazapine (REMERON) 7.5 MG tablet TAKE 1  TABLET(7.5 MG) BY MOUTH AT BEDTIME   . omeprazole (PRILOSEC) 40 MG capsule TAKE 1 CAPSULE BY MOUTH DAILY BEFORE A MEAL   . oxybutynin (DITROPAN) 5 MG tablet TAKE 1 TABLET(5 MG) BY MOUTH TWICE DAILY   . potassium chloride SA (KLOR-CON) 20 MEQ tablet TAKE 2 TABLETS(40 MEQ) BY MOUTH DAILY   . pregabalin (LYRICA) 75 MG capsule TAKE ONE CAPSULE BY MOUTH AT BEDTIME   . senna-docusate (SENOKOT-S) 8.6-50 MG tablet Take 2 tablets by mouth at bedtime as needed for mild constipation.   . torsemide (DEMADEX) 20 MG tablet Take 1 tablet (20 mg total) by mouth 2 (two) times  daily. You may take an extra 20 mg tablet daily as needed for swelling    No facility-administered encounter medications on file as of 04/17/2020.     Goals Addressed            This Visit's Progress   . Collaborate with RN Care Manager to perform appropriate assessments to assist with care management and care coordination needs       CARE PLAN ENTRY (see longitudinal plan of care for additional care plan information)  Current Barriers:  . Transportation . Limited access to caregiver . Inability to perform ADL's independently . Inability to perform IADL's independently . Physical decline directly impacted by DM II, CHF, and Malignant Neoplasm . Knowledge deficits related to management of chronic conditions   Social Work Clinical Goal(s):  [name] Over the next 10 days the patient will attend appointment with primary care provider as previously scheduled to address hyperglycemia Goal Met . Over the next 30 days the patient will work with SW to review health plan benefits including transportation to and from physician appointments Goal Met . Over the next 45 days the patient will work with primary care provider as well as care management team to better manage DM II as evidenced by improvement of self reported fasting blood sugars  CCM SW Interventions: Completed 04/17/20 . Successful outbound call placed to the patient to assess for goal progression . Confirmed patient attended OV with [name] . Reviewed patient transportation benefit offered under health plan o Confirmed patient knowledge how to contact health plan to arrange transportation for future needs o Reminded patient of need to arrange transportation a minimum of 3 business days prior to appointment . Determined the patient is continuing to have difficulty managing blood sugar o Chart reviewed to note [place] PharmD call scheduled for 7/12 and outreach by [name] scheduled for 7/20 o Advised the patient of [place]  appointments and plans for both clinicians to assist with disease management . Scheduled follow up call over the next 6 weeks to assist with ongoing care coordination needs  Completed 03/31/20 . Inter-disciplinary care team collaboration (see longitudinal plan of care) . Performed chart review  o Noted contact with cardiology team 5/27 to report increased SOB on exertion with swelling to lower legs o Cardiology team instructed patient to increase Torsemide by 21m and contact office if problems persist . Successful outbound call placed to the patient who reports SOB has improved o Patient reports she feels the SOB was a result of catching a cold from her grand-children o Patient reports cold like symptoms are improving  . Discussed patient management of diabetes and goal to check CBG 3 times dails o Patient reports her fasting BS readings have been elevated for several days without known cause o Reported readings ranging from 200-300 with this mornings FSBS over 300  o Patient denies symptoms of hyperglycemia or snacking throughout the night . Advised patient SW would collaborate with [name] regarding increase in readings . Reminded patient of upcomming appointment to see [name] on Thursday 6/10 o Patient reports she is not sure if she has secured transportation for this appointment o Provided date and time for the patient to discuss with her daughter, [name], who assists with transportation arrangements o Encouraged the patient to contact SW as needed for transportation resources . Patient reports continued engagement with [facility] to address Malignant Neoplasm o Patient continues with oral chemo. No nausea reported by the patient o Next appointment planned for July o Encouraged the patient to contact oncologist with concerns as needed prior to next appointment . Informed by the patient she is pureeing meat with meals due to concern with meat becoming stuck in her  throat o Advised the patient to speak with [name] about this at [place] appointment . Collaboration with [name] and RN Care Manager to provide patient update  Patient Self Care Activities:  . Patient verbalizes understanding of plan to work with PCP and care management team to address ongoing care management needs . Calls provider office for new concerns or questions . Unable to perform ADLs independently . Unable to perform IADLs independently  Please see past updates related to this goal by clicking on the "Past Updates" button in the selected goal          Follow Up Plan: SW will follow up with patient by phone over the next 6 weeks.   [name], BSW, CDP Social Worker, Certified Dementia Practitioner [place] [facility] [place] [facility] [phone]  Total time spent performing care coordination and/or care management activities with the patient by phone or face to face = 25 minutes.

## 2020-04-30 DIAGNOSIS — N183 Chronic kidney disease, stage 3 unspecified: Secondary | ICD-10-CM | POA: Diagnosis not present

## 2020-04-30 DIAGNOSIS — I69351 Hemiplegia and hemiparesis following cerebral infarction affecting right dominant side: Secondary | ICD-10-CM | POA: Diagnosis not present

## 2020-04-30 DIAGNOSIS — E1165 Type 2 diabetes mellitus with hyperglycemia: Secondary | ICD-10-CM | POA: Diagnosis not present

## 2020-04-30 DIAGNOSIS — Z794 Long term (current) use of insulin: Secondary | ICD-10-CM | POA: Diagnosis not present

## 2020-04-30 DIAGNOSIS — Z950 Presence of cardiac pacemaker: Secondary | ICD-10-CM | POA: Diagnosis not present

## 2020-04-30 DIAGNOSIS — E1122 Type 2 diabetes mellitus with diabetic chronic kidney disease: Secondary | ICD-10-CM | POA: Diagnosis not present

## 2020-04-30 DIAGNOSIS — I130 Hypertensive heart and chronic kidney disease with heart failure and stage 1 through stage 4 chronic kidney disease, or unspecified chronic kidney disease: Secondary | ICD-10-CM | POA: Diagnosis not present

## 2020-04-30 DIAGNOSIS — Z86718 Personal history of other venous thrombosis and embolism: Secondary | ICD-10-CM | POA: Diagnosis not present

## 2020-04-30 DIAGNOSIS — I5032 Chronic diastolic (congestive) heart failure: Secondary | ICD-10-CM | POA: Diagnosis not present

## 2020-04-30 DIAGNOSIS — Z7901 Long term (current) use of anticoagulants: Secondary | ICD-10-CM | POA: Diagnosis not present

## 2020-04-30 DIAGNOSIS — D649 Anemia, unspecified: Secondary | ICD-10-CM | POA: Diagnosis not present

## 2020-04-30 DIAGNOSIS — M1990 Unspecified osteoarthritis, unspecified site: Secondary | ICD-10-CM | POA: Diagnosis not present

## 2020-05-01 ENCOUNTER — Ambulatory Visit
Admission: RE | Admit: 2020-05-01 | Discharge: 2020-05-01 | Disposition: A | Discharge status: Home / Self Care | Payer: Medicare Other | Source: Ambulatory Visit | Attending: Hematology and Oncology | Admitting: Hematology and Oncology

## 2020-05-01 ENCOUNTER — Other Ambulatory Visit: Payer: Self-pay

## 2020-05-01 DIAGNOSIS — C50311 Malignant neoplasm of lower-inner quadrant of right female breast: Secondary | ICD-10-CM

## 2020-05-01 DIAGNOSIS — D0511 Intraductal carcinoma in situ of right breast: Secondary | ICD-10-CM | POA: Diagnosis not present

## 2020-05-01 DIAGNOSIS — Z170 Estrogen receptor positive status [ER+]: Secondary | ICD-10-CM

## 2020-05-01 DIAGNOSIS — R928 Other abnormal and inconclusive findings on diagnostic imaging of breast: Secondary | ICD-10-CM | POA: Diagnosis not present

## 2020-05-04 ENCOUNTER — Ambulatory Visit: Payer: Medicare Other

## 2020-05-04 ENCOUNTER — Other Ambulatory Visit: Payer: Self-pay

## 2020-05-04 DIAGNOSIS — I130 Hypertensive heart and chronic kidney disease with heart failure and stage 1 through stage 4 chronic kidney disease, or unspecified chronic kidney disease: Secondary | ICD-10-CM

## 2020-05-04 DIAGNOSIS — E1165 Type 2 diabetes mellitus with hyperglycemia: Secondary | ICD-10-CM

## 2020-05-04 NOTE — Chronic Care Management (AMB) (Signed)
Chronic Care Management Pharmacy  Name: [name]  MRN: [id number] DOB: 04-Aug-1933  Chief Complaint/ HPI  [name],  86 y.o. , female presents for their Initial CCM visit with the clinical pharmacist via telephone due to COVID-19 Pandemic.  PCP : [name], MD  Their chronic conditions include: HTN, Hypertensive heart disease with heart failure, SVT, DM, CKD, CHF, Anemia, Arthritis.   Office Visits: 04/02/2020 - patient requesting home health PT. CMP14+ EGFR, Vitamin b12 and HgbA1c ordered. Vitamin B-12 injection administered. Vitamin b12 elevated - may stop supplements. A1c is 8.9%. Patient complains of low appetite - begin mirtazapine. Worsened kidney function take torsemide only as prescribed.    04/02/2020 - AWV - Recommend Dexa, annual eye doctor and annual dental visit. Discussed Shingrix vaccine.    11/28/2019 - UTI - placed on Cipro and Pyridium. Sent out urine culture. F/u in 2 weeks for DM.   Consult Visit: 03/12/2020 - Podiatry - debridement of nail beds both feet.   01/20/2020 - Urology   01/14/2020 - Cardio - can increase torsemide to take an extra pill when she feels increased leg swelling. Elevate legs and continue to use compression stockings.   01/10/2020 - Ortho - OA of knee and not a candidate for replacement. Prescription for brace given for knee brace with metal upright.   11/12/2019 - Oncology - watchful monitoring with antiestrogen therapy. RTC in 6 months with bil mammogram and ultrasound.   11/06/2019 - [facility] Surgeon   CCM Visits: 04/17/2020 - Reviewed transportation options and updated patient on details regarding scheduling.   04/03/2020 - improve diet to ADA standards, Lower a1c <8% and begin using orthotic brace.   03/31/2020 - Improved SOB. Coordinating transportation needs with daughter for appointments.   12/10/2019 - SW - coordination of needs regarding TED hose and Diabetic shoes.   Medications: Outpatient  Encounter Medications as of 05/04/2020  Medication Sig Note  . acetaminophen (TYLENOL) 500 MG tablet Take 1,000 mg by mouth every 6 (six) hours as needed for mild pain, moderate pain or headache.    . anastrozole (ARIMIDEX) 1 MG tablet TAKE 1 TABLET(1 MG) BY MOUTH DAILY (Patient taking differently: Take 1 mg by mouth daily. )   . atorvastatin (LIPITOR) 20 MG tablet Take 1 tablet (20 mg total) by mouth at bedtime.   . diclofenac sodium (VOLTAREN) 1 % GEL Apply 2 g topically 4 (four) times daily.   . ferrous sulfate 325 (65 FE) MG tablet Take 325 mg by mouth daily with breakfast.   . HUMALOG KWIKPEN 200 UNIT/ML KwikPen INJECT 3 UNITS UNDER THE SKIN BEFORE BREAKFAST, LUNCH, DINNER IF SUGARS ARE GREATER THAN 175. (Patient taking differently: Inject 3 Units into the skin 3 (three) times daily as needed (Blood Sugar > 175). )   . mirtazapine (REMERON) 7.5 MG tablet TAKE 1 TABLET(7.5 MG) BY MOUTH AT BEDTIME (Patient taking differently: Take 7.5 mg by mouth at bedtime. )   . omeprazole (PRILOSEC) 40 MG capsule TAKE 1 CAPSULE BY MOUTH DAILY BEFORE A MEAL (Patient taking differently: Take 40 mg by mouth daily before breakfast. )   . oxybutynin (DITROPAN) 5 MG tablet TAKE 1 TABLET(5 MG) BY MOUTH TWICE DAILY (Patient taking differently: Take 5 mg by mouth 2 (two) times daily. )   . [DISCONTINUED] ACCU-CHEK AVIVA PLUS test strip USE AS DIRECTED TO CHECK BLOOD SUGAR FOUR TIMES DAILY   . [DISCONTINUED] Accu-Chek Softclix Lancets lancets Use as instructed to check blood sugars 4 times  per day dx: e11.65   . [DISCONTINUED] amiodarone (PACERONE) 200 MG tablet Take 1 tablet (200 mg total) by mouth daily.   . [DISCONTINUED] apixaban (ELIQUIS) 2.5 MG TABS tablet Take 1 tablet (2.5 mg total) by mouth 2 (two) times daily.   . [DISCONTINUED] BD PEN NEEDLE NANO 2ND GEN 32G X 4 MM MISC USE AS DIRECTED   . [DISCONTINUED] Blood Glucose Monitoring Suppl (ACCU-CHEK AVIVA PLUS) w/Device KIT USE AS DIRECTED TO CHECK BLOOD SUGAR FOUR  TIMES DAILY dx: e11.65   . [DISCONTINUED] calcium carbonate (OS-CAL) 600 MG TABS tablet Take 600 mg by mouth daily with breakfast.  (Patient not taking: Reported on 06/22/2020)   . [DISCONTINUED] carvedilol (COREG) 12.5 MG tablet TAKE 1 TABLET(12.5 MG) BY MOUTH TWICE DAILY (Patient taking differently: Take 12.5 mg by mouth 2 (two) times daily with a meal. )   . [DISCONTINUED] Cholecalciferol (VITAMIN D-3 PO) Take 1 capsule by mouth daily after breakfast.   . [DISCONTINUED] Cyanocobalamin (B-12 PO) Take 1 tablet by mouth daily after breakfast.    . [DISCONTINUED] insulin detemir (LEVEMIR) 100 UNIT/ML injection Take 16 units in the morning and 10 units in the evening. (Patient not taking: Reported on 06/22/2020)   . [DISCONTINUED] isosorbide-hydrALAZINE (BIDIL) 20-37.5 MG tablet Take 1 tablet by mouth 3 (three) times daily.   . [DISCONTINUED] LEVEMIR FLEXTOUCH 100 UNIT/ML FlexPen INJECT 30 UNITS UNDER THE SKIN EVERY NIGHT AT BEDTIME AS DIRECTED PER SLIDING SCALE PROTOCOL (Patient taking differently: Inject 0-30 Units into the skin at bedtime. )   . [DISCONTINUED] magnesium oxide (MAG-OX) 400 MG tablet Take 400 mg by mouth daily.  (Patient not taking: Reported on 06/22/2020) 04/06/2020: Patient ran out of this medication but plans to refill and restart.   . [DISCONTINUED] metoprolol succinate (TOPROL-XL) 50 MG 24 hr tablet TAKE 1 TABLET BY MOUTH EVERY DAY   . [DISCONTINUED] potassium chloride SA (KLOR-CON) 20 MEQ tablet TAKE 2 TABLETS(40 MEQ) BY MOUTH DAILY   . [DISCONTINUED] pregabalin (LYRICA) 75 MG capsule TAKE ONE CAPSULE BY MOUTH AT BEDTIME   . [DISCONTINUED] senna-docusate (SENOKOT-S) 8.6-50 MG tablet Take 2 tablets by mouth at bedtime as needed for mild constipation.   . [DISCONTINUED] torsemide (DEMADEX) 20 MG tablet Take 1 tablet (20 mg total) by mouth 2 (two) times daily. You may take an extra 20 mg tablet daily as needed for swelling (Patient taking differently: Take 20 mg by mouth 2 (two) times  daily. )    No facility-administered encounter medications on file as of 05/04/2020.   Allergies  Allergen Reactions  . Aspirin Itching  . Penicillins Itching and Rash    DID THE REACTION INVOLVE: Swelling of the face/tongue/throat, SOB, or low BP? Yes Sudden or severe rash/hives, skin peeling, or the inside of the mouth or nose? No Did it require medical treatment? Yes When did it last happen?"More than 10 years ago" If all above answers are "NO", may proceed with cephalosporin use.    SDOH Interventions     Most Recent Value  SDOH Interventions  Financial Strain Interventions Other (Comment)  [Assist patient with patient assistance for insulin]     Current Diagnosis/Assessment:  Goals Addressed            This Visit's Progress   . Pharmacy Care Plan       CARE PLAN ENTRY (see longitudinal plan of care for additional care plan information)  Current Barriers:  . Chronic Disease Management support, education, and care coordination needs related to Hypertension and  Diabetes   Hypertension BP Readings from Last 3 Encounters:  07/20/20 (!) 147/73  07/01/20 (!) 116/59  06/21/20 (!) 156/64   . Pharmacist Clinical Goal(s): o Over the next 90 days, patient will work with PharmD and providers to achieve BP goal <130/80 . Current regimen:  o Carvedilol 12.582m twice daily o Torsemide 20 twice daily, may take an extra tablet daily if needed for swelling o Bidil 20-37.553mthree times daily . Interventions: o Provided dietary and exercise recommendations o Recommend chair exercises o Assist patient with obtaining home blood pressure monitor if needed . Patient self care activities - Over the next 90 days, patient will: o Check BP once blood pressure monitored obtained, document, and provide at future appointments o Ensure daily salt intake < 2300 mg/day o Increase exercise as able with a goal of 30 minutes 5 times weekly - Chair exercises  Diabetes Lab Results  Component  Value Date/Time   HGBA1C 9.0 (H) 07/14/2020 09:00 AM   HGBA1C 9.1 (H) 06/22/2020 04:21 PM   . Pharmacist Clinical Goal(s): o Over the next 90 days, patient will work with PharmD and providers to achieve A1c goal <7% . Current regimen:  o Humalog kwikpen 200 unit/ml 3 units before meals if blood sugar greater than 175 per sliding scale  o Levemir 16 units in the am and 10 units in the pm . Interventions: o Provided dietary and exercise recommendations o Will assist with patient assistance application for insulin . Patient self care activities - Over the next 90 days, patient will: o Check blood sugar twice daily, document, and provide at future appointments o Contact provider with any episodes of hypoglycemia o Increase exercise as able with a goal of 30 minutes 5 times weekly - Chair exercises  Medication management . Pharmacist Clinical Goal(s): o Over the next 90 days, patient will work with PharmD and providers to achieve optimal medication adherence . Current pharmacy: [facility] . Interventions o Comprehensive medication review performed. o Continue current medication management strategy . Patient self care activities - Over the next 90 days, patient will: o Focus on medication adherence by utilizing a pill box o Take medications as prescribed o Report any questions or concerns to PharmD and/or provider(s)  Initial goal documentation       SVT   Patient has failed these meds in past: Atenolol, metoprolol,  Patient is currently controlled on the following medications:  . Amiodarone 200 mg daily . Carvedilol 12.5 mg bid  Plan Continue current medications   Heart Failure   Type: Diastolic  Last ejection fraction: 30%  BP today is:  Less than 130/80  Office blood pressures are  BP Readings from Last 3 Encounters:  04/02/20 (!) 148/70  04/02/20 (!) 148/70  01/14/20 (!) 144/80   Patient has failed these meds in the past: Amlodipine, bumetanide, furosemide,  atenolol, lisinopril, losartan, metoprolol, felodipine Patient is currently uncontrolled on the following medications:   Carvedilol 12.82m72mID  Torsemide 20 BID, may take an extra tablet daily if needed for swelling  Bidil 20-37.82mg18mD  Patient checks BP at home: Pt does not have home BP monitor Patient home BP readings are ranging: N/A  We discussed:  Pt reports she sometimes gets dizzy when she stands up  Pt states she does not have an OTC catalog from the insurance Diet and exercise extensively Limit salt  Plan Continue current medications Will try to assist pt with obtaining home BP monitor from DME company  Diabetes  Recent Relevant Labs: Lab Results  Component Value Date/Time   HGBA1C 9.0 (H) 07/14/2020 09:00 AM   HGBA1C 9.1 (H) 06/22/2020 04:21 PM   MICROALBUR 150 05/21/2019 04:37 PM    Checking BG: 2x per Day- 3x per day  Recent FBG Readings: 99, 310, 184, 220, 279, 150 Recent pre-meal BG readings:  Recent 2hr PP BG readings:  189, 144, 160, 170 Recent HS BG readings: 200, 144, 192 Patient has failed these meds in past: lantus, metformin Patient is currently uncontrolled on the following medications:   Humalog kwikpen 200 unit/ml 3 units before meals if blood sugar greater than 175 per sliding scale   Levemir 16 units in the am and 10 units in the pm  Last diabetic Foot exam: No results found for: HMDIABEYEEXA  Last diabetic Eye exam: No results found for: HMDIABFOOTEX   We discussed:   Pt states she has always used Levemir twice daily . Diet extensively o Breakfast: cereal, oatmeal, egg beaters, [place] bacon, link sausage, toast, cranberry juice o Lunch: [place] sandwich, fruit cup (no sugar) - Pt states she sometimes forgets o Dinner: Meat, starch vegetable - Fried chicken, BBQ, macaroni and cheese, potatoes - Pt stats she has cut back on carbohydrates in the last week o Pt does go out to eat some o Sometimes pt snacks at night o Drinks 2-3 16  ounce bottles of water daily o Recommend pt drink 64 ounces of water daily . Exercise extensively o No exercise due to wheelchair o Recommend chair exercises o Pt is starting physical therapy tomorrow . Pt mentions that insulin is expensive  Plan Continue current medications  Assist with patient assistance application for insulin   Hx of CVA   LDL goal < 100  Lipid Panel     Component Value Date/Time   CHOL 195 08/15/2018 1236   TRIG 56 01/02/2019 1425   HDL 81 08/15/2018 1236   LDLCALC 96 08/15/2018 1236    Hepatic Function Latest Ref Rng & Units 07/19/2020 07/18/2020 07/17/2020  Total Protein 6.5 - 8.1 g/dL 5.6(L) 5.9(L) 5.7(L)  Albumin 3.5 - 5.0 g/dL 2.1(L) 2.2(L) 2.2(L)  AST 15 - 41 U/L '22 25 31  ' ALT 0 - 44 U/L '23 27 30  ' Alk Phosphatase 38 - 126 U/L 58 65 55  Total Bilirubin 0.3 - 1.2 mg/dL 0.4 0.5 0.4  Bilirubin, Direct 0.0 - 0.2 mg/dL - - -     The ASCVD Risk score ([place]., et al., 2013) failed to calculate for the following reasons:   The 2013 ASCVD risk score is only valid for ages 26 to 62   Patient has failed these meds in past: N/A Patient is currently controlled on the following medications:  . Atorvastatin 20 mg qhs . Eliquis 2.5 mg bid   We discussed:  Pt says she bruises easily, but no bleeding usually (once in a while has a nose bleed, not serious)  Plan Continue current medications  Decreased Appetite   Patient has failed these meds in past: N/A Patient is currently controlled on the following medications:   Mirtazapine 7.5 mg qhs  We discussed:    Pt is sleeping better since starting mirtazapine  Has to urinate at night though  Reports mirtazapine is helping with appetite some  Plan Continue current medications  Dysphagia   Patient has failed these meds in past: esomeprazole Patient is currently controlled on the following medications:  . Omeprazole 40 mg daily before a meal  Plan Continue  current medications  Anemia    CBC Latest Ref Rng & Units 07/20/2020 07/19/2020 07/18/2020  WBC 4.0 - 10.5 K/uL 4.3 4.2 4.6  Hemoglobin 12.0 - 15.0 g/dL 8.4(L) 8.7(L) 9.5(L)  Hematocrit 36 - 46 % 26.7(L) 28.1(L) 30.9(L)  Platelets 150 - 400 K/uL 218 204 197   Patient has failed these meds in past: N/A Patient is currently controlled on the following medications:  . Ferrous sulfate 325 mg daily with breakfast  Plan Continue current medications  Osteopenia / Osteoporosis   Last DEXA Scan: Not on file  T-Score femoral neck:   T-Score total hip:   T-Score lumbar spine:   T-Score forearm radius:   10-year probability of major osteoporotic fracture:   10-year probability of hip fracture:   No results found for: VD25OH   Patient is not a candidate for pharmacologic treatment  Patient has failed these meds in past: [place] Patient is currently controlled on the following medications:  [name] Vitamin D3 2000 units daily  Plan Continue current medications  Health Maintenance   Patient is currently on the following medications:  . Acetaminophen 1000 mg q6h prn -  mild pain, moderate pain or headache . Anastrozole 1 mg daily - hx of breast cancer . Magnesium oxide 400 mg daily  . Lyrica 75 mg qhs . Potassium chloride 40 meq daily  . Voltaren gel 1% 2 grams qid   Plan Continue current medications  Vaccines   Reviewed and discussed patient's vaccination history.    Immunization History  Administered Date(s) Administered  . DTaP 12/13/2012  . Fluad Quad(high Dose 65+) 07/16/2020  . Influenza, High Dose Seasonal PF 08/15/2018  . Pneumococcal-Unspecified 04/04/2016   Plan Review and discuss at follow up.   Medication Management   Pt uses [facility] for all medications Uses pill box? Yes Pt endorses 85% compliance  We discussed:  Importance of taking all medications as directed every day  Plan Continue current medication management strategy  Follow up: 1 month phone visit  Jannette Fogo, PharmD Clinical Pharmacist [facility] [phone]

## 2020-05-05 DIAGNOSIS — Z7901 Long term (current) use of anticoagulants: Secondary | ICD-10-CM | POA: Diagnosis not present

## 2020-05-05 DIAGNOSIS — D649 Anemia, unspecified: Secondary | ICD-10-CM | POA: Diagnosis not present

## 2020-05-05 DIAGNOSIS — I5032 Chronic diastolic (congestive) heart failure: Secondary | ICD-10-CM | POA: Diagnosis not present

## 2020-05-05 DIAGNOSIS — E1122 Type 2 diabetes mellitus with diabetic chronic kidney disease: Secondary | ICD-10-CM | POA: Diagnosis not present

## 2020-05-05 DIAGNOSIS — M1990 Unspecified osteoarthritis, unspecified site: Secondary | ICD-10-CM | POA: Diagnosis not present

## 2020-05-05 DIAGNOSIS — I130 Hypertensive heart and chronic kidney disease with heart failure and stage 1 through stage 4 chronic kidney disease, or unspecified chronic kidney disease: Secondary | ICD-10-CM | POA: Diagnosis not present

## 2020-05-05 DIAGNOSIS — Z950 Presence of cardiac pacemaker: Secondary | ICD-10-CM | POA: Diagnosis not present

## 2020-05-05 DIAGNOSIS — Z794 Long term (current) use of insulin: Secondary | ICD-10-CM | POA: Diagnosis not present

## 2020-05-05 DIAGNOSIS — I69351 Hemiplegia and hemiparesis following cerebral infarction affecting right dominant side: Secondary | ICD-10-CM | POA: Diagnosis not present

## 2020-05-05 DIAGNOSIS — E1165 Type 2 diabetes mellitus with hyperglycemia: Secondary | ICD-10-CM | POA: Diagnosis not present

## 2020-05-05 DIAGNOSIS — Z86718 Personal history of other venous thrombosis and embolism: Secondary | ICD-10-CM | POA: Diagnosis not present

## 2020-05-05 DIAGNOSIS — N183 Chronic kidney disease, stage 3 unspecified: Secondary | ICD-10-CM | POA: Diagnosis not present

## 2020-05-06 ENCOUNTER — Other Ambulatory Visit: Payer: Self-pay

## 2020-05-06 ENCOUNTER — Ambulatory Visit (INDEPENDENT_AMBULATORY_CARE_PROVIDER_SITE_OTHER): Payer: Medicare Other | Admitting: Internal Medicine

## 2020-05-06 ENCOUNTER — Encounter: Payer: Self-pay | Admitting: Internal Medicine

## 2020-05-06 VITALS — BP 120/64 | HR 68 | Ht 65.0 in | Wt 192.0 lb

## 2020-05-06 DIAGNOSIS — Z950 Presence of cardiac pacemaker: Secondary | ICD-10-CM | POA: Diagnosis not present

## 2020-05-06 DIAGNOSIS — I10 Essential (primary) hypertension: Secondary | ICD-10-CM | POA: Diagnosis not present

## 2020-05-06 DIAGNOSIS — I440 Atrioventricular block, first degree: Secondary | ICD-10-CM

## 2020-05-06 DIAGNOSIS — R55 Syncope and collapse: Secondary | ICD-10-CM

## 2020-05-06 MED ORDER — AMIODARONE HCL 200 MG PO TABS: ORAL_TABLET | ORAL | 3 refills | Status: DC

## 2020-05-06 NOTE — Patient Instructions (Addendum)
Medication Instructions:  Your physician has recommended you make the following change in your medication:   1 Change how you take amiodarone (200mg )   Take one tablet by mouth daily Monday- Saturday. Do NOT take Sunday   *If you need a refill on your cardiac medications before your next appointment, please call your pharmacy*  Lab Work: None ordered.  If you have labs (blood work) drawn today and your tests are completely normal, you will receive your results only by: [name] MyChart Message (if you have MyChart) OR . A paper copy in the mail If you have any lab test that is abnormal or we need to change your treatment, we will call you to review the results.  Testing/Procedures: None ordered.  Follow-Up: At [facility], you and your health needs are our priority.  As part of our continuing mission to provide you with exceptional heart care, we have created designated Provider Care Teams.  These Care Teams include your primary Cardiologist (physician) and Advanced Practice Providers (APPs -  Physician Assistants and Nurse Practitioners) who all work together to provide you with the care you need, when you need it.  We recommend signing up for the patient portal called "MyChart".  Sign up information is provided on this After Visit Summary.  MyChart is used to connect with patients for Virtual Visits (Telemedicine).  Patients are able to view lab/test results, encounter notes, upcoming appointments, etc.  Non-urgent messages can be sent to your provider as well.   To learn more about what you can do with MyChart, go to [email or web address].    Your next appointment:   Your physician wants you to follow-up in: 1 year with Dr. Lovena Le. You will receive a reminder letter in the mail two months in advance. If you don't receive a letter, please call our office to schedule the follow-up appointment.   Other Instructions:

## 2020-05-06 NOTE — Progress Notes (Signed)
HPI [name] returns today for followup. She is a pleasant 86 yo woman with a h/o SVT s/p remote ablation of a concealed mid-septal AP. She subsequently developed heart block and underwent PPM insertion. She has done well in the interim except for an overall reduction in physical activity. She is using a wheel chair.She has been on amiodarone and maintaining NSR. No chest pain or sob. No syncope.  Allergies  Allergen Reactions  . Aspirin Itching  . Penicillins Itching and Rash    DID THE REACTION INVOLVE: Swelling of the face/tongue/throat, SOB, or low BP? Yes Sudden or severe rash/hives, skin peeling, or the inside of the mouth or nose? No Did it require medical treatment? Yes When did it last happen?"More than 10 years ago" If all above answers are "NO", may proceed with cephalosporin use.      Current Outpatient Medications  Medication Sig Dispense Refill  . ACCU-CHEK AVIVA PLUS test strip USE AS DIRECTED TO CHECK BLOOD SUGAR FOUR TIMES DAILY 300 strip 2  . Accu-Chek Softclix Lancets lancets Use as instructed to check blood sugars 4 times per day dx: e11.65 400 each 3  . acetaminophen (TYLENOL) 500 MG tablet Take 1,000 mg by mouth every 6 (six) hours as needed for mild pain, moderate pain or headache.     . anastrozole (ARIMIDEX) 1 MG tablet TAKE 1 TABLET(1 MG) BY MOUTH DAILY 90 tablet 0  . apixaban (ELIQUIS) 2.5 MG TABS tablet Take 1 tablet (2.5 mg total) by mouth 2 (two) times daily. 60 tablet 5  . atorvastatin (LIPITOR) 20 MG tablet Take 1 tablet (20 mg total) by mouth at bedtime. 90 tablet 2  . BD PEN NEEDLE NANO 2ND GEN 32G X 4 MM MISC USE AS DIRECTED 200 each 2  . Blood Glucose Monitoring Suppl (ACCU-CHEK AVIVA PLUS) w/Device KIT USE AS DIRECTED TO CHECK BLOOD SUGAR FOUR TIMES DAILY dx: e11.65 1 kit 1  . calcium carbonate (OS-CAL) 600 MG TABS tablet Take 600 mg by mouth daily with breakfast.     . carvedilol (COREG) 12.5 MG tablet TAKE 1 TABLET(12.5 MG) BY MOUTH TWICE  DAILY 180 tablet 2  . Cholecalciferol (VITAMIN D-3 PO) Take 1 capsule by mouth daily after breakfast.    . Cyanocobalamin (B-12 PO) Take 1 tablet by mouth daily after breakfast.     . diclofenac sodium (VOLTAREN) 1 % GEL Apply 2 g topically 4 (four) times daily. (Patient taking differently: Apply 2 g topically See admin instructions. Apply 2 grams to knees at bedtime and an additional 2 grams three times a day as needed for pain) 100 g 1  . ferrous sulfate 325 (65 FE) MG tablet Take 325 mg by mouth daily with breakfast.    . HUMALOG KWIKPEN 200 UNIT/ML KwikPen INJECT 3 UNITS UNDER THE SKIN BEFORE BREAKFAST, LUNCH, DINNER IF SUGARS ARE GREATER THAN 175. 45 mL 1  . insulin detemir (LEVEMIR) 100 UNIT/ML injection Take 16 units in the morning and 10 units in the evening.    . isosorbide-hydrALAZINE (BIDIL) 20-37.5 MG tablet Take 1 tablet by mouth 3 (three) times daily. 270 tablet 3  . LEVEMIR FLEXTOUCH 100 UNIT/ML FlexPen INJECT 30 UNITS UNDER THE SKIN EVERY NIGHT AT BEDTIME AS DIRECTED PER SLIDING SCALE PROTOCOL 15 mL 2  . magnesium oxide (MAG-OX) 400 MG tablet Take 400 mg by mouth daily.     . metoprolol succinate (TOPROL-XL) 50 MG 24 hr tablet TAKE 1 TABLET BY MOUTH EVERY DAY  90 tablet 0  . mirtazapine (REMERON) 7.5 MG tablet TAKE 1 TABLET(7.5 MG) BY MOUTH AT BEDTIME 90 tablet 1  . omeprazole (PRILOSEC) 40 MG capsule TAKE 1 CAPSULE BY MOUTH DAILY BEFORE A MEAL 90 capsule 1  . oxybutynin (DITROPAN) 5 MG tablet TAKE 1 TABLET(5 MG) BY MOUTH TWICE DAILY 180 tablet 1  . potassium chloride SA (KLOR-CON) 20 MEQ tablet TAKE 2 TABLETS(40 MEQ) BY MOUTH DAILY 180 tablet 1  . pregabalin (LYRICA) 75 MG capsule TAKE ONE CAPSULE BY MOUTH AT BEDTIME 90 capsule 1  . senna-docusate (SENOKOT-S) 8.6-50 MG tablet Take 2 tablets by mouth at bedtime as needed for mild constipation.    . torsemide (DEMADEX) 20 MG tablet Take 1 tablet (20 mg total) by mouth 2 (two) times daily. You may take an extra 20 mg tablet daily as  needed for swelling 180 tablet 2  . amiodarone (PACERONE) 200 MG tablet Take one tablet by mouth daily Monday-Saturday. Do NOT take Sunday 90 tablet 3   No current facility-administered medications for this visit.     Past Medical History:  Diagnosis Date  . Abnormal echocardiogram    a. possible mass on echo 05/2019 on PPM, b. no evidence of mass / atrial lead vegetation on follow up echo 06/2019  . Anemia   . Arthritis   . Breast cancer ([place]) 10/2018   Invasive ductal carcinoma  . Cancer ([place])   . Cataract   . Chronic combined systolic and diastolic CHF (congestive heart failure) ([place])    a. Previously diastolic but patient AVS from outside hospital listed systolic CHF and cardiomyopathy, records pending.  . CKD (chronic kidney disease), stage III   . Clotting disorder ([place])   . CVA (cerebral vascular accident) ([place])    residual right sided weakness and mild dysphagia  . Diabetes mellitus ([place])   . Dizziness and giddiness   . DVT (deep venous thrombosis) (HCC)    a. on anticoagulation for this.  . Essential hypertension   . LBBB (left bundle branch block)   . Mitral regurgitation    a. Mod MR by echo 2014.  . Muscular deconditioning   . Obesity   . Pacemaker   . Pericardial effusion   . SVT (supraventricular tachycardia) ([place])    a. In 2013 she had an EPS with ablation for SVT which did not eliminate the SVT completely. She also had bradycardia which limited medication. She was placed on amiodarone by Dr. Lovena Le.  . Thrombocytopenia ([place]) 11/21/2011    ROS:   All systems reviewed and negative except as noted in the HPI.   Past Surgical History:  Procedure Laterality Date  . EYE SURGERY    . SUPRAVENTRICULAR TACHYCARDIA ABLATION N/A 10/11/2012   Procedure: SUPRAVENTRICULAR TACHYCARDIA ABLATION;  Surgeon: Evans Lance, MD;  Location: [facility] CATH LAB;  Service: Cardiovascular;  Laterality: N/A;     Family History  Problem Relation Age of Onset  . Stroke Mother   .  Cancer Father        prostate  . Hypertension Daughter   . Heart attack Brother        x2  . Hypertension Brother   . Stroke Sister   . Hypertension Sister   . Breast cancer Maternal Aunt      Social History   Socioeconomic History  . Marital status: Widowed    Spouse name: Not on file  . Number of children: Not on file  . Years of education: Not on file  .  Highest education level: Not on file  Occupational History  . Occupation: retired  Tobacco Use  . Smoking status: Never Smoker  . Smokeless tobacco: Never Used  Vaping Use  . Vaping Use: Never used  Substance and Sexual Activity  . Alcohol use: No  . Drug use: No  . Sexual activity: Not Currently  Other Topics Concern  . Not on file  Social History Narrative   Lives at home with grandchild, daughter comes and help    Social Determinants of Health   Financial Resource Strain: Low Risk   . Difficulty of Paying Living Expenses: Not hard at all  Food Insecurity: No Food Insecurity  . Worried About Charity fundraiser in the Last Year: Never true  . Ran Out of Food in the Last Year: Never true  Transportation Needs: No Transportation Needs  . Lack of Transportation (Medical): No  . Lack of Transportation (Non-Medical): No  Physical Activity: Inactive  . Days of Exercise per Week: 0 days  . Minutes of Exercise per Session: 0 min  Stress: No Stress Concern Present  . Feeling of Stress : Only a little  Social Connections:   . Frequency of Communication with Friends and Family:   . Frequency of Social Gatherings with Friends and Family:   . Attends Religious Services:   . Active Member of Clubs or Organizations:   . Attends Archivist Meetings:   [name] Marital Status:   Intimate Partner Violence:   . Fear of Current or Ex-Partner:   . Emotionally Abused:   [name] Physically Abused:   . Sexually Abused:      BP 120/64   Pulse 68   Ht '5\' 5"'  (1.651 m)   Wt 192 lb (87.1 kg)   SpO2 96%   BMI 31.95 kg/m    Physical Exam:  Chronically ill appearing elderly woman, NAD HEENT: Unremarkable Neck:  6 cm JVD, no thyromegally Lymphatics:  No adenopathy Back:  No CVA tenderness Lungs:  Clear with no wheezes HEART:  Regular rate rhythm, no murmurs, no rubs, no clicks Abd:  soft, positive bowel sounds, no organomegally, no rebound, no guarding Ext:  2 plus pulses, no edema, no cyanosis, no clubbing Skin:  No rashes no nodules Neuro:  CN II through XII intact, motor grossly intact  EKG - NSR with pacing induced LBBB  DEVICE  Normal device function.  See PaceArt for details.   Assess/Plan: 1. CHB - she is asymptomatic, s/p PPM insertion. 2. PPM - her Biotronik DDD PM is working normally. She has about 8 years of battery longevity. 3. HTN - her SBP is well controlled. No change in her meds.   Salome Spotted.

## 2020-05-07 DIAGNOSIS — Z86718 Personal history of other venous thrombosis and embolism: Secondary | ICD-10-CM | POA: Diagnosis not present

## 2020-05-07 DIAGNOSIS — I5032 Chronic diastolic (congestive) heart failure: Secondary | ICD-10-CM | POA: Diagnosis not present

## 2020-05-07 DIAGNOSIS — I69351 Hemiplegia and hemiparesis following cerebral infarction affecting right dominant side: Secondary | ICD-10-CM | POA: Diagnosis not present

## 2020-05-07 DIAGNOSIS — N183 Chronic kidney disease, stage 3 unspecified: Secondary | ICD-10-CM | POA: Diagnosis not present

## 2020-05-07 DIAGNOSIS — E1165 Type 2 diabetes mellitus with hyperglycemia: Secondary | ICD-10-CM | POA: Diagnosis not present

## 2020-05-07 DIAGNOSIS — Z7901 Long term (current) use of anticoagulants: Secondary | ICD-10-CM | POA: Diagnosis not present

## 2020-05-07 DIAGNOSIS — I130 Hypertensive heart and chronic kidney disease with heart failure and stage 1 through stage 4 chronic kidney disease, or unspecified chronic kidney disease: Secondary | ICD-10-CM | POA: Diagnosis not present

## 2020-05-07 DIAGNOSIS — M1990 Unspecified osteoarthritis, unspecified site: Secondary | ICD-10-CM | POA: Diagnosis not present

## 2020-05-07 DIAGNOSIS — Z950 Presence of cardiac pacemaker: Secondary | ICD-10-CM | POA: Diagnosis not present

## 2020-05-07 DIAGNOSIS — Z794 Long term (current) use of insulin: Secondary | ICD-10-CM | POA: Diagnosis not present

## 2020-05-07 DIAGNOSIS — E1122 Type 2 diabetes mellitus with diabetic chronic kidney disease: Secondary | ICD-10-CM | POA: Diagnosis not present

## 2020-05-07 DIAGNOSIS — D649 Anemia, unspecified: Secondary | ICD-10-CM | POA: Diagnosis not present

## 2020-05-11 ENCOUNTER — Inpatient Hospital Stay: Payer: Medicare Other | Attending: Hematology and Oncology | Admitting: Hematology and Oncology

## 2020-05-11 ENCOUNTER — Telehealth: Payer: Self-pay | Admitting: Hematology and Oncology

## 2020-05-11 NOTE — Telephone Encounter (Signed)
Called pt per 7/19 sch msg - no answer. Left  message for patient to call back to reschedule appt.

## 2020-05-11 NOTE — Assessment & Plan Note (Deleted)
11/19/2018:Palpable right breast mass with calcifications, mammogram revealed focal asymmetry lower right breast 5:00 measuring 4.6 cm, ultrasound revealed overall size of 5.7 cm ultrasound-guided biopsy revealed grade 2 IDC ER 90% PR 80% Ki-67 5%, HER-2 negative, T3N0 stage IIa clinical stage  Treatment plan: 1.Neoadjuvant antiestrogen therapy with anastrozolestarted 11/28/2018 2.  Patient went to [facility] and they felt that she could undergo surgery.  She was seen by Dr. Donne Hazel and the plan is watchful monitoring on antiestrogen therapy.  09/06/2019: Mammogram and [place]: Decrease in size of the mass from 4.6 cm to 0.5 cm, no axillary lymph nodes 05/01/2020: Mammogram and ultrasound: Stable appearance of the right breast lesion measuring 0.4 cm.  Current plan: Watchful monitoring  Anastrozole toxicities: hair loss  RTC in 1 year with Bil mammograms and ultrasound of Rt breast

## 2020-05-12 ENCOUNTER — Other Ambulatory Visit: Payer: Self-pay

## 2020-05-12 ENCOUNTER — Ambulatory Visit: Payer: Self-pay

## 2020-05-12 ENCOUNTER — Telehealth: Payer: Medicare Other

## 2020-05-12 DIAGNOSIS — E1165 Type 2 diabetes mellitus with hyperglycemia: Secondary | ICD-10-CM

## 2020-05-12 DIAGNOSIS — M1990 Unspecified osteoarthritis, unspecified site: Secondary | ICD-10-CM | POA: Diagnosis not present

## 2020-05-12 DIAGNOSIS — D649 Anemia, unspecified: Secondary | ICD-10-CM | POA: Diagnosis not present

## 2020-05-12 DIAGNOSIS — I5032 Chronic diastolic (congestive) heart failure: Secondary | ICD-10-CM

## 2020-05-12 DIAGNOSIS — I69351 Hemiplegia and hemiparesis following cerebral infarction affecting right dominant side: Secondary | ICD-10-CM | POA: Diagnosis not present

## 2020-05-12 DIAGNOSIS — E1122 Type 2 diabetes mellitus with diabetic chronic kidney disease: Secondary | ICD-10-CM | POA: Diagnosis not present

## 2020-05-12 DIAGNOSIS — Z950 Presence of cardiac pacemaker: Secondary | ICD-10-CM | POA: Diagnosis not present

## 2020-05-12 DIAGNOSIS — N183 Chronic kidney disease, stage 3 unspecified: Secondary | ICD-10-CM | POA: Diagnosis not present

## 2020-05-12 DIAGNOSIS — Z170 Estrogen receptor positive status [ER+]: Secondary | ICD-10-CM

## 2020-05-12 DIAGNOSIS — Z794 Long term (current) use of insulin: Secondary | ICD-10-CM | POA: Diagnosis not present

## 2020-05-12 DIAGNOSIS — C50311 Malignant neoplasm of lower-inner quadrant of right female breast: Secondary | ICD-10-CM

## 2020-05-12 DIAGNOSIS — I130 Hypertensive heart and chronic kidney disease with heart failure and stage 1 through stage 4 chronic kidney disease, or unspecified chronic kidney disease: Secondary | ICD-10-CM

## 2020-05-12 DIAGNOSIS — Z7901 Long term (current) use of anticoagulants: Secondary | ICD-10-CM | POA: Diagnosis not present

## 2020-05-12 DIAGNOSIS — Z86718 Personal history of other venous thrombosis and embolism: Secondary | ICD-10-CM | POA: Diagnosis not present

## 2020-05-13 NOTE — Chronic Care Management (AMB) (Signed)
  Chronic Care Management   Outreach Note  05/13/2020 Name: [name] MRN: [id number] DOB: May 18, 1933  Referred by: [name], MD Reason for referral : Chronic Care Management ([facility] RN CM Call )   An unsuccessful telephone outreach was attempted today. The patient was referred to the case management team for assistance with care management and care coordination.   Follow Up Plan: A HIPPA compliant phone message was left for the patient providing contact information and requesting a return call.  Telephone follow up appointment with care management team member scheduled for: 06/24/20  [name], RN, BSN, CCM Care Management Coordinator [place] [facility]  Direct Phone: [phone]

## 2020-05-14 DIAGNOSIS — Z950 Presence of cardiac pacemaker: Secondary | ICD-10-CM | POA: Diagnosis not present

## 2020-05-14 DIAGNOSIS — I5032 Chronic diastolic (congestive) heart failure: Secondary | ICD-10-CM | POA: Diagnosis not present

## 2020-05-14 DIAGNOSIS — I69351 Hemiplegia and hemiparesis following cerebral infarction affecting right dominant side: Secondary | ICD-10-CM | POA: Diagnosis not present

## 2020-05-14 DIAGNOSIS — I130 Hypertensive heart and chronic kidney disease with heart failure and stage 1 through stage 4 chronic kidney disease, or unspecified chronic kidney disease: Secondary | ICD-10-CM | POA: Diagnosis not present

## 2020-05-14 DIAGNOSIS — Z7901 Long term (current) use of anticoagulants: Secondary | ICD-10-CM | POA: Diagnosis not present

## 2020-05-14 DIAGNOSIS — E1165 Type 2 diabetes mellitus with hyperglycemia: Secondary | ICD-10-CM | POA: Diagnosis not present

## 2020-05-14 DIAGNOSIS — Z794 Long term (current) use of insulin: Secondary | ICD-10-CM | POA: Diagnosis not present

## 2020-05-14 DIAGNOSIS — D649 Anemia, unspecified: Secondary | ICD-10-CM | POA: Diagnosis not present

## 2020-05-14 DIAGNOSIS — Z86718 Personal history of other venous thrombosis and embolism: Secondary | ICD-10-CM | POA: Diagnosis not present

## 2020-05-14 DIAGNOSIS — E1122 Type 2 diabetes mellitus with diabetic chronic kidney disease: Secondary | ICD-10-CM | POA: Diagnosis not present

## 2020-05-14 DIAGNOSIS — N183 Chronic kidney disease, stage 3 unspecified: Secondary | ICD-10-CM | POA: Diagnosis not present

## 2020-05-14 DIAGNOSIS — M1990 Unspecified osteoarthritis, unspecified site: Secondary | ICD-10-CM | POA: Diagnosis not present

## 2020-05-19 ENCOUNTER — Ambulatory Visit: Payer: Medicare Other | Admitting: Internal Medicine

## 2020-05-19 ENCOUNTER — Other Ambulatory Visit: Payer: Self-pay

## 2020-05-19 ENCOUNTER — Ambulatory Visit (INDEPENDENT_AMBULATORY_CARE_PROVIDER_SITE_OTHER): Payer: Medicare Other | Admitting: Internal Medicine

## 2020-05-19 ENCOUNTER — Encounter: Payer: Self-pay | Admitting: Internal Medicine

## 2020-05-19 VITALS — BP 142/76 | HR 68 | Temp 97.9°F

## 2020-05-19 DIAGNOSIS — F5101 Primary insomnia: Secondary | ICD-10-CM | POA: Diagnosis not present

## 2020-05-19 DIAGNOSIS — R269 Unspecified abnormalities of gait and mobility: Secondary | ICD-10-CM

## 2020-05-19 DIAGNOSIS — E1122 Type 2 diabetes mellitus with diabetic chronic kidney disease: Secondary | ICD-10-CM | POA: Diagnosis not present

## 2020-05-19 DIAGNOSIS — I5032 Chronic diastolic (congestive) heart failure: Secondary | ICD-10-CM | POA: Diagnosis not present

## 2020-05-19 DIAGNOSIS — N184 Chronic kidney disease, stage 4 (severe): Secondary | ICD-10-CM

## 2020-05-19 DIAGNOSIS — G8929 Other chronic pain: Secondary | ICD-10-CM

## 2020-05-19 DIAGNOSIS — M25511 Pain in right shoulder: Secondary | ICD-10-CM

## 2020-05-19 DIAGNOSIS — R600 Localized edema: Secondary | ICD-10-CM | POA: Diagnosis not present

## 2020-05-19 DIAGNOSIS — I130 Hypertensive heart and chronic kidney disease with heart failure and stage 1 through stage 4 chronic kidney disease, or unspecified chronic kidney disease: Secondary | ICD-10-CM

## 2020-05-19 DIAGNOSIS — Z794 Long term (current) use of insulin: Secondary | ICD-10-CM

## 2020-05-19 MED ORDER — TRAMADOL HCL 50 MG PO TABS: 50.0000 mg | ORAL_TABLET | Freq: Two times a day (BID) | ORAL | 0 refills | Status: DC | PRN

## 2020-05-19 NOTE — Patient Instructions (Signed)

## 2020-05-19 NOTE — Progress Notes (Signed)
This visit occurred during the SARS-CoV-2 public health emergency.  Safety protocols were in place, including screening questions prior to the visit, additional usage of staff PPE, and extensive cleaning of exam room while observing appropriate contact time as indicated for disinfecting solutions.  Subjective:     Patient ID: [name] , female    DOB: Apr 19, 1933 , 86 y.o.   MRN: [id number]   Chief Complaint  Patient presents with  . Diabetes    HPI  She presents today for DM check. She reports compliance with meds. She is accompanied by her daughter today.   Additionally, she is concerned about her ability to walk. She is seated in wheelchair for now.   Diabetes She presents for her follow-up diabetic visit. She has type 2 diabetes mellitus. Hypoglycemia symptoms include nervousness/anxiousness. There are no diabetic associated symptoms. Risk factors for coronary artery disease include diabetes mellitus, dyslipidemia, obesity, hypertension, sedentary lifestyle and post-menopausal. She is following a diabetic diet. She never participates in exercise. Eye exam is not current.     Past Medical History:  Diagnosis Date  . Abnormal echocardiogram    a. possible mass on echo 05/2019 on PPM, b. no evidence of mass / atrial lead vegetation on follow up echo 06/2019  . Anemia   . Arthritis   . Breast cancer ([place]) 10/2018   Invasive ductal carcinoma  . Cancer ([place])   . Cataract   . Chronic combined systolic and diastolic CHF (congestive heart failure) ([place])    a. Previously diastolic but patient AVS from outside hospital listed systolic CHF and cardiomyopathy, records pending.  . CKD (chronic kidney disease), stage III   . Clotting disorder ([place])   . CVA (cerebral vascular accident) ([place])    residual right sided weakness and mild dysphagia  . Diabetes mellitus ([place])   . Dizziness and giddiness   . DVT (deep venous thrombosis) (HCC)    a. on anticoagulation for this.  . Essential  hypertension   . LBBB (left bundle branch block)   . Mitral regurgitation    a. Mod MR by echo 2014.  . Muscular deconditioning   . Obesity   . Pacemaker   . Pericardial effusion   . SVT (supraventricular tachycardia) ([place])    a. In 2013 she had an EPS with ablation for SVT which did not eliminate the SVT completely. She also had bradycardia which limited medication. She was placed on amiodarone by Dr. Lovena Le.  . Thrombocytopenia ([place]) 11/21/2011     Family History  Problem Relation Age of Onset  . Stroke Mother   . Cancer Father        prostate  . Hypertension Daughter   . Heart attack Brother        x2  . Hypertension Brother   . Stroke Sister   . Hypertension Sister   . Breast cancer Maternal Aunt      Current Outpatient Medications:  .  ACCU-CHEK AVIVA PLUS test strip, USE AS DIRECTED TO CHECK BLOOD SUGAR FOUR TIMES DAILY, Disp: 300 strip, Rfl: 2 .  Accu-Chek Softclix Lancets lancets, Use as instructed to check blood sugars 4 times per day dx: e11.65, Disp: 400 each, Rfl: 3 .  acetaminophen (TYLENOL) 500 MG tablet, Take 1,000 mg by mouth every 6 (six) hours as needed for mild pain, moderate pain or headache. , Disp: , Rfl:  .  amiodarone (PACERONE) 200 MG tablet, Take one tablet by mouth daily Monday-Saturday. Do NOT take Sunday, Disp: 90  tablet, Rfl: 3 .  anastrozole (ARIMIDEX) 1 MG tablet, TAKE 1 TABLET(1 MG) BY MOUTH DAILY, Disp: 90 tablet, Rfl: 0 .  apixaban (ELIQUIS) 2.5 MG TABS tablet, Take 1 tablet (2.5 mg total) by mouth 2 (two) times daily., Disp: 60 tablet, Rfl: 5 .  atorvastatin (LIPITOR) 20 MG tablet, Take 1 tablet (20 mg total) by mouth at bedtime., Disp: 90 tablet, Rfl: 2 .  BD PEN NEEDLE NANO 2ND GEN 32G X 4 MM MISC, USE AS DIRECTED, Disp: 200 each, Rfl: 2 .  Blood Glucose Monitoring Suppl (ACCU-CHEK AVIVA PLUS) w/Device KIT, USE AS DIRECTED TO CHECK BLOOD SUGAR FOUR TIMES DAILY dx: e11.65, Disp: 1 kit, Rfl: 1 .  calcium carbonate (OS-CAL) 600 MG TABS tablet,  Take 600 mg by mouth daily with breakfast. , Disp: , Rfl:  .  carvedilol (COREG) 12.5 MG tablet, TAKE 1 TABLET(12.5 MG) BY MOUTH TWICE DAILY, Disp: 180 tablet, Rfl: 2 .  Cholecalciferol (VITAMIN D-3 PO), Take 1 capsule by mouth daily after breakfast., Disp: , Rfl:  .  Cyanocobalamin (B-12 PO), Take 1 tablet by mouth daily after breakfast. , Disp: , Rfl:  .  diclofenac sodium (VOLTAREN) 1 % GEL, Apply 2 g topically 4 (four) times daily. (Patient taking differently: Apply 2 g topically See admin instructions. Apply 2 grams to knees at bedtime and an additional 2 grams three times a day as needed for pain), Disp: 100 g, Rfl: 1 .  ferrous sulfate 325 (65 FE) MG tablet, Take 325 mg by mouth daily with breakfast., Disp: , Rfl:  .  HUMALOG KWIKPEN 200 UNIT/ML KwikPen, INJECT 3 UNITS UNDER THE SKIN BEFORE BREAKFAST, LUNCH, DINNER IF SUGARS ARE GREATER THAN 175., Disp: 45 mL, Rfl: 1 .  insulin detemir (LEVEMIR) 100 UNIT/ML injection, Take 16 units in the morning and 10 units in the evening., Disp: , Rfl:  .  isosorbide-hydrALAZINE (BIDIL) 20-37.5 MG tablet, Take 1 tablet by mouth 3 (three) times daily., Disp: 270 tablet, Rfl: 3 .  LEVEMIR FLEXTOUCH 100 UNIT/ML FlexPen, INJECT 30 UNITS UNDER THE SKIN EVERY NIGHT AT BEDTIME AS DIRECTED PER SLIDING SCALE PROTOCOL, Disp: 15 mL, Rfl: 2 .  magnesium oxide (MAG-OX) 400 MG tablet, Take 400 mg by mouth daily. , Disp: , Rfl:  .  metoprolol succinate (TOPROL-XL) 50 MG 24 hr tablet, TAKE 1 TABLET BY MOUTH EVERY DAY, Disp: 90 tablet, Rfl: 0 .  mirtazapine (REMERON) 7.5 MG tablet, TAKE 1 TABLET(7.5 MG) BY MOUTH AT BEDTIME, Disp: 90 tablet, Rfl: 1 .  omeprazole (PRILOSEC) 40 MG capsule, TAKE 1 CAPSULE BY MOUTH DAILY BEFORE A MEAL, Disp: 90 capsule, Rfl: 1 .  oxybutynin (DITROPAN) 5 MG tablet, TAKE 1 TABLET(5 MG) BY MOUTH TWICE DAILY, Disp: 180 tablet, Rfl: 1 .  pregabalin (LYRICA) 75 MG capsule, TAKE ONE CAPSULE BY MOUTH AT BEDTIME, Disp: 90 capsule, Rfl: 1 .  torsemide  (DEMADEX) 20 MG tablet, Take 1 tablet (20 mg total) by mouth 2 (two) times daily. You may take an extra 20 mg tablet daily as needed for swelling, Disp: 180 tablet, Rfl: 2 .  potassium chloride SA (KLOR-CON) 20 MEQ tablet, TAKE 2 TABLETS(40 MEQ) BY MOUTH DAILY, Disp: 180 tablet, Rfl: 1 .  Semaglutide,0.25 or 0.5MG/DOS, (OZEMPIC, 0.25 OR 0.5 MG/DOSE,) 2 MG/1.5ML SOPN, Inject 0.25 mg into the skin., Disp: , Rfl:  .  traMADol (ULTRAM) 50 MG tablet, Take 1 tablet (50 mg total) by mouth every 12 (twelve) hours as needed., Disp: 30 tablet, Rfl: 0 No  current facility-administered medications for this visit.  Facility-Administered Medications Ordered in Other Visits:  .  0.9 %  sodium chloride infusion, , Intravenous, PRN, Kathrine Haddock, NP .  albuterol (VENTOLIN HFA) 108 (90 Base) MCG/ACT inhaler 2 puff, 2 puff, Inhalation, Once PRN, Kathrine Haddock, NP .  diphenhydrAMINE (BENADRYL) injection 50 mg, 50 mg, Intravenous, Once PRN, Kathrine Haddock, NP .  EPINEPHrine (EPI-PEN) injection 0.3 mg, 0.3 mg, Intramuscular, Once PRN, Kathrine Haddock, NP .  famotidine (PEPCID) IVPB 20 mg premix, 20 mg, Intravenous, Once PRN, Kathrine Haddock, NP .  methylPREDNISolone sodium succinate (SOLU-MEDROL) 125 mg/2 mL injection 125 mg, 125 mg, Intravenous, Once PRN, Kathrine Haddock, NP   Allergies  Allergen Reactions  . Aspirin Itching  . Penicillins Itching and Rash    DID THE REACTION INVOLVE: Swelling of the face/tongue/throat, SOB, or low BP? Yes Sudden or severe rash/hives, skin peeling, or the inside of the mouth or nose? No Did it require medical treatment? Yes When did it last happen?"More than 10 years ago" If all above answers are "NO", may proceed with cephalosporin use.      Review of Systems  Constitutional: Negative.   Respiratory: Negative.   Cardiovascular: Negative.   Gastrointestinal: Negative.   Neurological: Negative.   Psychiatric/Behavioral: The patient is nervous/anxious.      Today's  Vitals   05/19/20 1228  BP: (!) 142/76  Pulse: 68  Temp: 97.9 F (36.6 C)  TempSrc: Oral   There is no height or weight on file to calculate BMI.   Objective:  Physical Exam Vitals and nursing note reviewed.  Constitutional:      Appearance: Normal appearance. She is obese.  HENT:     Head: Normocephalic and atraumatic.  Cardiovascular:     Rate and Rhythm: Normal rate and regular rhythm.     Heart sounds: Normal heart sounds.  Pulmonary:     Effort: Pulmonary effort is normal.     Breath sounds: Normal breath sounds.  Musculoskeletal:        General: Tenderness present.     Right lower leg: Edema present.     Left lower leg: Edema present.     Comments: LLE > RLE Unable to raise right shoulder. There is tenderness to palpation. No overlying erythema.   Skin:    General: Skin is warm.  Neurological:     General: No focal deficit present.     Mental Status: She is alert.  Psychiatric:        Mood and Affect: Mood normal.        Behavior: Behavior normal.         Assessment And Plan:     1. Type 2 diabetes mellitus with stage 4 chronic kidney disease, with long-term current use of insulin ([place]) Comments: Labwork not needed today.  BS log reviewed in full detail, sugars are erratic. She is encouraged to eat regular meals. Advised to avoid sugary beverages.   2. 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 She is encouraged to avoid adding salt to her foods.   3. Chronic diastolic heart failure (HCC) Comments: Chronic, yet stable.  Again, encouraged to elevate feet when seated.   4. Primary insomnia Comments: Chronic. There is likely a component of adjustment insomnia as well. Advised to take magnesium 250-444m nightly. Also advised to have bedtime routine.   5. Localized edema Comments: I will refer her to LLE venous u/s to r/o DVT. - VAS [place]  EXTREMITY VENOUS (DVT); Future  6. Chronic right shoulder  pain Comments: Her sx are suggestive of frozen shoulder. I will refer her to home health PT for further evaluation.    7. Gait abnormality  She agrees to [facility] PT eval.  - Ambulatory referral to [place]   Patient was given opportunity to ask questions. Patient verbalized understanding of the plan and was able to repeat key elements of the plan. All questions were answered to their satisfaction.  Maximino Greenland, MD   I, Maximino Greenland, MD, have reviewed all documentation for this visit. The documentation on 06/21/20 for the exam, diagnosis, procedures, and orders are all accurate and complete.  THE PATIENT IS ENCOURAGED TO PRACTICE SOCIAL DISTANCING DUE TO THE COVID-19 PANDEMIC.

## 2020-05-20 ENCOUNTER — Telehealth: Payer: Self-pay | Admitting: Hematology and Oncology

## 2020-05-20 DIAGNOSIS — N183 Chronic kidney disease, stage 3 unspecified: Secondary | ICD-10-CM | POA: Diagnosis not present

## 2020-05-20 DIAGNOSIS — M1990 Unspecified osteoarthritis, unspecified site: Secondary | ICD-10-CM | POA: Diagnosis not present

## 2020-05-20 DIAGNOSIS — I69351 Hemiplegia and hemiparesis following cerebral infarction affecting right dominant side: Secondary | ICD-10-CM | POA: Diagnosis not present

## 2020-05-20 DIAGNOSIS — Z7901 Long term (current) use of anticoagulants: Secondary | ICD-10-CM | POA: Diagnosis not present

## 2020-05-20 DIAGNOSIS — D649 Anemia, unspecified: Secondary | ICD-10-CM | POA: Diagnosis not present

## 2020-05-20 DIAGNOSIS — E1165 Type 2 diabetes mellitus with hyperglycemia: Secondary | ICD-10-CM | POA: Diagnosis not present

## 2020-05-20 DIAGNOSIS — I5032 Chronic diastolic (congestive) heart failure: Secondary | ICD-10-CM | POA: Diagnosis not present

## 2020-05-20 DIAGNOSIS — Z794 Long term (current) use of insulin: Secondary | ICD-10-CM | POA: Diagnosis not present

## 2020-05-20 DIAGNOSIS — Z950 Presence of cardiac pacemaker: Secondary | ICD-10-CM | POA: Diagnosis not present

## 2020-05-20 DIAGNOSIS — I130 Hypertensive heart and chronic kidney disease with heart failure and stage 1 through stage 4 chronic kidney disease, or unspecified chronic kidney disease: Secondary | ICD-10-CM | POA: Diagnosis not present

## 2020-05-20 DIAGNOSIS — Z86718 Personal history of other venous thrombosis and embolism: Secondary | ICD-10-CM | POA: Diagnosis not present

## 2020-05-20 DIAGNOSIS — E1122 Type 2 diabetes mellitus with diabetic chronic kidney disease: Secondary | ICD-10-CM | POA: Diagnosis not present

## 2020-05-20 NOTE — Telephone Encounter (Signed)
Rescheduled appointment per 7/27 scheduling message. Left message on patient voicemail with updated appointment date and time.

## 2020-05-21 ENCOUNTER — Telehealth: Payer: Self-pay

## 2020-05-21 DIAGNOSIS — N183 Chronic kidney disease, stage 3 unspecified: Secondary | ICD-10-CM | POA: Diagnosis not present

## 2020-05-21 DIAGNOSIS — E1122 Type 2 diabetes mellitus with diabetic chronic kidney disease: Secondary | ICD-10-CM | POA: Diagnosis not present

## 2020-05-21 DIAGNOSIS — Z86718 Personal history of other venous thrombosis and embolism: Secondary | ICD-10-CM | POA: Diagnosis not present

## 2020-05-21 DIAGNOSIS — E1165 Type 2 diabetes mellitus with hyperglycemia: Secondary | ICD-10-CM | POA: Diagnosis not present

## 2020-05-21 DIAGNOSIS — Z794 Long term (current) use of insulin: Secondary | ICD-10-CM | POA: Diagnosis not present

## 2020-05-21 DIAGNOSIS — I69351 Hemiplegia and hemiparesis following cerebral infarction affecting right dominant side: Secondary | ICD-10-CM | POA: Diagnosis not present

## 2020-05-21 DIAGNOSIS — Z7901 Long term (current) use of anticoagulants: Secondary | ICD-10-CM | POA: Diagnosis not present

## 2020-05-21 DIAGNOSIS — I130 Hypertensive heart and chronic kidney disease with heart failure and stage 1 through stage 4 chronic kidney disease, or unspecified chronic kidney disease: Secondary | ICD-10-CM | POA: Diagnosis not present

## 2020-05-21 DIAGNOSIS — M1990 Unspecified osteoarthritis, unspecified site: Secondary | ICD-10-CM | POA: Diagnosis not present

## 2020-05-21 DIAGNOSIS — I5032 Chronic diastolic (congestive) heart failure: Secondary | ICD-10-CM | POA: Diagnosis not present

## 2020-05-21 DIAGNOSIS — Z950 Presence of cardiac pacemaker: Secondary | ICD-10-CM | POA: Diagnosis not present

## 2020-05-21 DIAGNOSIS — D649 Anemia, unspecified: Secondary | ICD-10-CM | POA: Diagnosis not present

## 2020-05-21 NOTE — Telephone Encounter (Signed)
  Chronic Care Management   Outreach Note  05/21/2020 Name: [name] MRN: [id number] DOB: 1933-07-31  Referred by: [name], MD Reason for referral : Care Coordination   An unsuccessful telephone outreach was attempted today to assess patient goal progression. The patient was referred to the case management team for assistance with care management and care coordination.   Follow Up Plan: A HIPPA compliant phone message was left for the patient providing contact information and requesting a return call.  The care management team will reach out to the patient again over the next 14 days.   [name], BSW, CDP Social Worker, Certified Dementia Practitioner [place] [facility] [place] [facility] [phone]

## 2020-05-22 DIAGNOSIS — M21061 Valgus deformity, not elsewhere classified, right knee: Secondary | ICD-10-CM | POA: Diagnosis not present

## 2020-05-26 DIAGNOSIS — Z7901 Long term (current) use of anticoagulants: Secondary | ICD-10-CM | POA: Diagnosis not present

## 2020-05-26 DIAGNOSIS — D649 Anemia, unspecified: Secondary | ICD-10-CM | POA: Diagnosis not present

## 2020-05-26 DIAGNOSIS — Z794 Long term (current) use of insulin: Secondary | ICD-10-CM | POA: Diagnosis not present

## 2020-05-26 DIAGNOSIS — N183 Chronic kidney disease, stage 3 unspecified: Secondary | ICD-10-CM | POA: Diagnosis not present

## 2020-05-26 DIAGNOSIS — I69351 Hemiplegia and hemiparesis following cerebral infarction affecting right dominant side: Secondary | ICD-10-CM | POA: Diagnosis not present

## 2020-05-26 DIAGNOSIS — E1122 Type 2 diabetes mellitus with diabetic chronic kidney disease: Secondary | ICD-10-CM | POA: Diagnosis not present

## 2020-05-26 DIAGNOSIS — E1165 Type 2 diabetes mellitus with hyperglycemia: Secondary | ICD-10-CM | POA: Diagnosis not present

## 2020-05-26 DIAGNOSIS — I5032 Chronic diastolic (congestive) heart failure: Secondary | ICD-10-CM | POA: Diagnosis not present

## 2020-05-26 DIAGNOSIS — Z950 Presence of cardiac pacemaker: Secondary | ICD-10-CM | POA: Diagnosis not present

## 2020-05-26 DIAGNOSIS — I130 Hypertensive heart and chronic kidney disease with heart failure and stage 1 through stage 4 chronic kidney disease, or unspecified chronic kidney disease: Secondary | ICD-10-CM | POA: Diagnosis not present

## 2020-05-26 DIAGNOSIS — M1990 Unspecified osteoarthritis, unspecified site: Secondary | ICD-10-CM | POA: Diagnosis not present

## 2020-05-26 DIAGNOSIS — Z86718 Personal history of other venous thrombosis and embolism: Secondary | ICD-10-CM | POA: Diagnosis not present

## 2020-05-28 DIAGNOSIS — D649 Anemia, unspecified: Secondary | ICD-10-CM | POA: Diagnosis not present

## 2020-05-28 DIAGNOSIS — M1990 Unspecified osteoarthritis, unspecified site: Secondary | ICD-10-CM | POA: Diagnosis not present

## 2020-05-28 DIAGNOSIS — Z794 Long term (current) use of insulin: Secondary | ICD-10-CM | POA: Diagnosis not present

## 2020-05-28 DIAGNOSIS — I130 Hypertensive heart and chronic kidney disease with heart failure and stage 1 through stage 4 chronic kidney disease, or unspecified chronic kidney disease: Secondary | ICD-10-CM | POA: Diagnosis not present

## 2020-05-28 DIAGNOSIS — Z86718 Personal history of other venous thrombosis and embolism: Secondary | ICD-10-CM | POA: Diagnosis not present

## 2020-05-28 DIAGNOSIS — Z7901 Long term (current) use of anticoagulants: Secondary | ICD-10-CM | POA: Diagnosis not present

## 2020-05-28 DIAGNOSIS — Z950 Presence of cardiac pacemaker: Secondary | ICD-10-CM | POA: Diagnosis not present

## 2020-05-28 DIAGNOSIS — I69351 Hemiplegia and hemiparesis following cerebral infarction affecting right dominant side: Secondary | ICD-10-CM | POA: Diagnosis not present

## 2020-05-28 DIAGNOSIS — E1165 Type 2 diabetes mellitus with hyperglycemia: Secondary | ICD-10-CM | POA: Diagnosis not present

## 2020-05-28 DIAGNOSIS — N183 Chronic kidney disease, stage 3 unspecified: Secondary | ICD-10-CM | POA: Diagnosis not present

## 2020-05-28 DIAGNOSIS — E1122 Type 2 diabetes mellitus with diabetic chronic kidney disease: Secondary | ICD-10-CM | POA: Diagnosis not present

## 2020-05-28 DIAGNOSIS — I5032 Chronic diastolic (congestive) heart failure: Secondary | ICD-10-CM | POA: Diagnosis not present

## 2020-05-28 LAB — CUP PACEART INCLINIC DEVICE CHECK
Date Time Interrogation Session: 20210714152505
Implantable Lead Implant Date: 20200624
Implantable Lead Implant Date: 20200624
Implantable Lead Location: 753859
Implantable Lead Location: 753860
Implantable Lead Model: 377
Implantable Lead Model: 377
Implantable Lead Serial Number: 81195510
Implantable Pulse Generator Implant Date: 20200624
Lead Channel Pacing Threshold Amplitude: 0.8 V
Lead Channel Pacing Threshold Amplitude: 0.9 V
Lead Channel Pacing Threshold Pulse Width: 0.4 ms
Lead Channel Pacing Threshold Pulse Width: 0.4 ms
Lead Channel Sensing Intrinsic Amplitude: 1.4 mV
Lead Channel Sensing Intrinsic Amplitude: 20.2 mV
Pulse Gen Model: 407145
Pulse Gen Serial Number: 69638782

## 2020-05-29 ENCOUNTER — Inpatient Hospital Stay: Payer: Medicare Other | Attending: Hematology and Oncology | Admitting: Hematology and Oncology

## 2020-05-29 NOTE — Assessment & Plan Note (Deleted)
11/19/2018:Palpable right breast mass with calcifications, mammogram revealed focal asymmetry lower right breast 5:00 measuring 4.6 cm, ultrasound revealed overall size of 5.7 cm ultrasound-guided biopsy revealed grade 2 IDC ER 90% PR 80% Ki-67 5%, HER-2 negative, T3N0 stage IIa clinical stage  Treatment plan: 1.Neoadjuvant antiestrogen therapy with anastrozolestarted 11/28/2018 2.  Patient went to [facility] and they felt that she could undergo surgery.  She was seen by Dr. Donne Hazel recently.    Mammogram and ultrasound 05/01/2020: Stable appearance of right breast at the site of previous malignancy 4 x 2.6 x 2.2 cm.  By ultrasound it measured 4 mm.  Anastrozole toxicities: hair loss  RTC in 1 year for follow-up

## 2020-06-02 ENCOUNTER — Telehealth: Payer: Self-pay

## 2020-06-02 NOTE — Telephone Encounter (Signed)
0.25mg  once weekly. She should already be doing this. If she is unable to administer medication as requested, she needs to let us know. I can get a home health agency to do this.

## 2020-06-02 NOTE — Telephone Encounter (Signed)
The pt's daughter [name] called and said that she forgot how much Ozempic [name] told her to give the pt at the pt's visit. Ms. Lavone Nian was told that [name] is out of the office and that I would make sure [name] got the message when she returns.

## 2020-06-02 NOTE — Telephone Encounter (Signed)
[name] was notified that Dr Baird Cancer said the pt is to do 0.25 mg weekly of the Ozempic and that she was supposed to had already started the medication. The pt said that she didn't get any information on how much to take of the Ozempic.  [name] was asked if she needed any help with giving herself her Ozempic injection and she said no she gives herself her own insulin shots so she can do the Ozempic.

## 2020-06-03 DIAGNOSIS — Z7901 Long term (current) use of anticoagulants: Secondary | ICD-10-CM | POA: Diagnosis not present

## 2020-06-03 DIAGNOSIS — Z950 Presence of cardiac pacemaker: Secondary | ICD-10-CM | POA: Diagnosis not present

## 2020-06-03 DIAGNOSIS — I69351 Hemiplegia and hemiparesis following cerebral infarction affecting right dominant side: Secondary | ICD-10-CM | POA: Diagnosis not present

## 2020-06-03 DIAGNOSIS — N183 Chronic kidney disease, stage 3 unspecified: Secondary | ICD-10-CM | POA: Diagnosis not present

## 2020-06-03 DIAGNOSIS — I5032 Chronic diastolic (congestive) heart failure: Secondary | ICD-10-CM | POA: Diagnosis not present

## 2020-06-03 DIAGNOSIS — M1990 Unspecified osteoarthritis, unspecified site: Secondary | ICD-10-CM | POA: Diagnosis not present

## 2020-06-03 DIAGNOSIS — D649 Anemia, unspecified: Secondary | ICD-10-CM | POA: Diagnosis not present

## 2020-06-03 DIAGNOSIS — E1122 Type 2 diabetes mellitus with diabetic chronic kidney disease: Secondary | ICD-10-CM | POA: Diagnosis not present

## 2020-06-03 DIAGNOSIS — E1165 Type 2 diabetes mellitus with hyperglycemia: Secondary | ICD-10-CM | POA: Diagnosis not present

## 2020-06-03 DIAGNOSIS — Z794 Long term (current) use of insulin: Secondary | ICD-10-CM | POA: Diagnosis not present

## 2020-06-03 DIAGNOSIS — Z86718 Personal history of other venous thrombosis and embolism: Secondary | ICD-10-CM | POA: Diagnosis not present

## 2020-06-03 DIAGNOSIS — I130 Hypertensive heart and chronic kidney disease with heart failure and stage 1 through stage 4 chronic kidney disease, or unspecified chronic kidney disease: Secondary | ICD-10-CM | POA: Diagnosis not present

## 2020-06-04 ENCOUNTER — Telehealth: Payer: Medicare Other

## 2020-06-04 ENCOUNTER — Telehealth: Payer: Self-pay

## 2020-06-04 DIAGNOSIS — I5032 Chronic diastolic (congestive) heart failure: Secondary | ICD-10-CM | POA: Diagnosis not present

## 2020-06-04 DIAGNOSIS — I69351 Hemiplegia and hemiparesis following cerebral infarction affecting right dominant side: Secondary | ICD-10-CM | POA: Diagnosis not present

## 2020-06-04 DIAGNOSIS — Z7901 Long term (current) use of anticoagulants: Secondary | ICD-10-CM | POA: Diagnosis not present

## 2020-06-04 DIAGNOSIS — M1990 Unspecified osteoarthritis, unspecified site: Secondary | ICD-10-CM | POA: Diagnosis not present

## 2020-06-04 DIAGNOSIS — Z86718 Personal history of other venous thrombosis and embolism: Secondary | ICD-10-CM | POA: Diagnosis not present

## 2020-06-04 DIAGNOSIS — E1122 Type 2 diabetes mellitus with diabetic chronic kidney disease: Secondary | ICD-10-CM | POA: Diagnosis not present

## 2020-06-04 DIAGNOSIS — E1165 Type 2 diabetes mellitus with hyperglycemia: Secondary | ICD-10-CM | POA: Diagnosis not present

## 2020-06-04 DIAGNOSIS — D649 Anemia, unspecified: Secondary | ICD-10-CM | POA: Diagnosis not present

## 2020-06-04 DIAGNOSIS — Z794 Long term (current) use of insulin: Secondary | ICD-10-CM | POA: Diagnosis not present

## 2020-06-04 DIAGNOSIS — N183 Chronic kidney disease, stage 3 unspecified: Secondary | ICD-10-CM | POA: Diagnosis not present

## 2020-06-04 DIAGNOSIS — Z950 Presence of cardiac pacemaker: Secondary | ICD-10-CM | POA: Diagnosis not present

## 2020-06-04 DIAGNOSIS — I130 Hypertensive heart and chronic kidney disease with heart failure and stage 1 through stage 4 chronic kidney disease, or unspecified chronic kidney disease: Secondary | ICD-10-CM | POA: Diagnosis not present

## 2020-06-04 NOTE — Telephone Encounter (Signed)
°  Chronic Care Management   Outreach Note  06/04/2020 Name: [name] MRN: [id number] DOB: 1933/05/23  Referred by: [name], MD Reason for referral : Care Coordination   A second unsuccessful telephone outreach was attempted today. The patient was referred to the case management team for assistance with care management and care coordination.   Follow Up Plan: A HIPPA compliant phone message was left for the patient providing contact information and requesting a return call.  The care management team will reach out to the patient again over the next 14 days.   [name], BSW, CDP Social Worker, Certified Dementia Practitioner [place] [facility] [place] [facility] [phone]

## 2020-06-09 DIAGNOSIS — I130 Hypertensive heart and chronic kidney disease with heart failure and stage 1 through stage 4 chronic kidney disease, or unspecified chronic kidney disease: Secondary | ICD-10-CM | POA: Diagnosis not present

## 2020-06-09 DIAGNOSIS — Z794 Long term (current) use of insulin: Secondary | ICD-10-CM | POA: Diagnosis not present

## 2020-06-09 DIAGNOSIS — D649 Anemia, unspecified: Secondary | ICD-10-CM | POA: Diagnosis not present

## 2020-06-09 DIAGNOSIS — M1990 Unspecified osteoarthritis, unspecified site: Secondary | ICD-10-CM | POA: Diagnosis not present

## 2020-06-09 DIAGNOSIS — Z7901 Long term (current) use of anticoagulants: Secondary | ICD-10-CM | POA: Diagnosis not present

## 2020-06-09 DIAGNOSIS — E1165 Type 2 diabetes mellitus with hyperglycemia: Secondary | ICD-10-CM | POA: Diagnosis not present

## 2020-06-09 DIAGNOSIS — E1122 Type 2 diabetes mellitus with diabetic chronic kidney disease: Secondary | ICD-10-CM | POA: Diagnosis not present

## 2020-06-09 DIAGNOSIS — I69351 Hemiplegia and hemiparesis following cerebral infarction affecting right dominant side: Secondary | ICD-10-CM | POA: Diagnosis not present

## 2020-06-09 DIAGNOSIS — I5032 Chronic diastolic (congestive) heart failure: Secondary | ICD-10-CM | POA: Diagnosis not present

## 2020-06-09 DIAGNOSIS — Z950 Presence of cardiac pacemaker: Secondary | ICD-10-CM | POA: Diagnosis not present

## 2020-06-09 DIAGNOSIS — Z86718 Personal history of other venous thrombosis and embolism: Secondary | ICD-10-CM | POA: Diagnosis not present

## 2020-06-09 DIAGNOSIS — N183 Chronic kidney disease, stage 3 unspecified: Secondary | ICD-10-CM | POA: Diagnosis not present

## 2020-06-11 DIAGNOSIS — M1990 Unspecified osteoarthritis, unspecified site: Secondary | ICD-10-CM | POA: Diagnosis not present

## 2020-06-11 DIAGNOSIS — Z86718 Personal history of other venous thrombosis and embolism: Secondary | ICD-10-CM | POA: Diagnosis not present

## 2020-06-11 DIAGNOSIS — I130 Hypertensive heart and chronic kidney disease with heart failure and stage 1 through stage 4 chronic kidney disease, or unspecified chronic kidney disease: Secondary | ICD-10-CM | POA: Diagnosis not present

## 2020-06-11 DIAGNOSIS — Z950 Presence of cardiac pacemaker: Secondary | ICD-10-CM | POA: Diagnosis not present

## 2020-06-11 DIAGNOSIS — N183 Chronic kidney disease, stage 3 unspecified: Secondary | ICD-10-CM | POA: Diagnosis not present

## 2020-06-11 DIAGNOSIS — Z7901 Long term (current) use of anticoagulants: Secondary | ICD-10-CM | POA: Diagnosis not present

## 2020-06-11 DIAGNOSIS — E1165 Type 2 diabetes mellitus with hyperglycemia: Secondary | ICD-10-CM | POA: Diagnosis not present

## 2020-06-11 DIAGNOSIS — I69351 Hemiplegia and hemiparesis following cerebral infarction affecting right dominant side: Secondary | ICD-10-CM | POA: Diagnosis not present

## 2020-06-11 DIAGNOSIS — D649 Anemia, unspecified: Secondary | ICD-10-CM | POA: Diagnosis not present

## 2020-06-11 DIAGNOSIS — I5032 Chronic diastolic (congestive) heart failure: Secondary | ICD-10-CM | POA: Diagnosis not present

## 2020-06-11 DIAGNOSIS — Z794 Long term (current) use of insulin: Secondary | ICD-10-CM | POA: Diagnosis not present

## 2020-06-11 DIAGNOSIS — E1122 Type 2 diabetes mellitus with diabetic chronic kidney disease: Secondary | ICD-10-CM | POA: Diagnosis not present

## 2020-06-12 ENCOUNTER — Ambulatory Visit: Payer: Medicare Other | Admitting: Podiatry

## 2020-06-12 ENCOUNTER — Other Ambulatory Visit: Payer: Self-pay

## 2020-06-12 DIAGNOSIS — N184 Chronic kidney disease, stage 4 (severe): Secondary | ICD-10-CM

## 2020-06-12 DIAGNOSIS — B351 Tinea unguium: Secondary | ICD-10-CM

## 2020-06-12 DIAGNOSIS — Z794 Long term (current) use of insulin: Secondary | ICD-10-CM

## 2020-06-12 DIAGNOSIS — E1122 Type 2 diabetes mellitus with diabetic chronic kidney disease: Secondary | ICD-10-CM

## 2020-06-12 DIAGNOSIS — M79674 Pain in right toe(s): Secondary | ICD-10-CM

## 2020-06-12 DIAGNOSIS — M79675 Pain in left toe(s): Secondary | ICD-10-CM

## 2020-06-13 ENCOUNTER — Encounter: Payer: Self-pay | Admitting: Podiatry

## 2020-06-13 NOTE — Progress Notes (Signed)
Subjective:  Patient ID: [name], female    DOB: 06/09/1933,  MRN: [id number]  [name] presents to clinic today for painful thick toenails that are difficult to trim. Pain interferes with ambulation. Aggravating factors include wearing enclosed shoe gear. Pain is relieved with periodic professional debridement..  86 y.o. female presents with the above complaint.    Review of Systems: Negative except as noted in the HPI. Past Medical History:  Diagnosis Date  . Abnormal echocardiogram    a. possible mass on echo 05/2019 on [facility], b. no evidence of mass / atrial lead vegetation on follow up echo 06/2019  . Anemia   . Arthritis   . Breast cancer ([place]) 10/2018   Invasive ductal carcinoma  . Cancer ([place])   . Cataract   . Chronic combined systolic and diastolic CHF (congestive heart failure) ([place])    a. Previously diastolic but patient AVS from outside hospital listed systolic CHF and cardiomyopathy, records pending.  . CKD (chronic kidney disease), stage III   . Clotting disorder ([place])   . CVA (cerebral vascular accident) ([place])    residual right sided weakness and mild dysphagia  . Diabetes mellitus ([place])   . Dizziness and giddiness   . DVT (deep venous thrombosis) ([facility])    a. on anticoagulation for this.  . Essential hypertension   . LBBB (left bundle branch block)   . Mitral regurgitation    a. Mod MR by echo 2014.  . Muscular deconditioning   . Obesity   . Pacemaker   . Pericardial effusion   . SVT (supraventricular tachycardia) ([place])    a. In 2013 she had an EPS with ablation for SVT which did not eliminate the SVT completely. She also had bradycardia which limited medication. She was placed on amiodarone by Dr. Lovena Le.  . Thrombocytopenia ([place]) 11/21/2011   Past Surgical History:  Procedure Laterality Date  . EYE SURGERY    . SUPRAVENTRICULAR TACHYCARDIA ABLATION N/A 10/11/2012   Procedure: SUPRAVENTRICULAR TACHYCARDIA ABLATION;  Surgeon: Evans Lance, MD;   Location[facility] C[facility];  Service: Cardiovascular;  Laterality: N/A;    Current Outpatient Medications:  .  ACCU-CHEK AVIVA PLUS test strip, USE AS DIRECTED TO CHECK BLOOD SUGAR FOUR TIMES DAILY, Disp: 300 strip, Rfl: 2 .  Accu-Chek Softclix Lancets lancets, Use as instructed to check blood sugars 4 times per day dx: e11.65, Disp: 400 each, Rfl: 3 .  acetaminophen (TYLENOL) 500 MG tablet, Take 1,000 mg by mouth every 6 (six) hours as needed for mild pain, moderate pain or headache. , Disp: , Rfl:  .  amiodarone (PACERONE) 200 MG tablet, Take one tablet by mouth daily Monday-Saturday. Do NOT take Sunday, Disp: 90 tablet, Rfl: 3 .  anastrozole (ARIMIDEX) 1 MG tablet, TAKE 1 TABLET(1 MG) BY MOUTH DAILY, Disp: 90 tablet, Rfl: 0 .  apixaban (ELIQUIS) 2.5 MG TABS tablet, Take 1 tablet (2.5 mg total) by mouth 2 (two) times daily., Disp: 60 tablet, Rfl: 5 .  atorvastatin (LIPITOR) 20 MG tablet, Take 1 tablet (20 mg total) by mouth at bedtime., Disp: 90 tablet, Rfl: 2 .  BD PEN NEEDLE NANO 2ND GEN 32G X 4 MM MISC, USE AS DIRECTED, Disp: 200 each, Rfl: 2 .  Blood Glucose Monitoring Suppl (ACCU-CHEK AVIVA PLUS) w/Device KIT, USE AS DIRECTED TO CHECK BLOOD SUGAR FOUR TIMES DAILY dx: e11.65, Disp: 1 kit, Rfl: 1 .  calcium carbonate (OS-CAL) 600 MG TABS tablet, Take 600 mg by mouth daily with  breakfast. , Disp: , Rfl:  .  carvedilol (COREG) 12.5 MG tablet, TAKE 1 TABLET(12.5 MG) BY MOUTH TWICE DAILY, Disp: 180 tablet, Rfl: 2 .  Cholecalciferol (VITAMIN D-3 PO), Take 1 capsule by mouth daily after breakfast., Disp: , Rfl:  .  Cyanocobalamin (B-12 PO), Take 1 tablet by mouth daily after breakfast. , Disp: , Rfl:  .  diclofenac sodium (VOLTAREN) 1 % GEL, Apply 2 g topically 4 (four) times daily. (Patient taking differently: Apply 2 g topically See admin instructions. Apply 2 grams to knees at bedtime and an additional 2 grams three times a day as needed for pain), Disp: 100 g, Rfl: 1 .  ferrous sulfate 325 (65 FE) MG  tablet, Take 325 mg by mouth daily with breakfast., Disp: , Rfl:  .  HUMALOG KWIKPEN 200 UNIT/ML KwikPen, INJECT 3 UNITS UNDER THE SKIN BEFORE BREAKFAST, LUNCH, DINNER IF SUGARS ARE GREATER THAN 175., Disp: 45 mL, Rfl: 1 .  insulin detemir (LEVEMIR) 100 UNIT/ML injection, Take 16 units in the morning and 10 units in the evening., Disp: , Rfl:  .  isosorbide-hydrALAZINE (BIDIL) 20-37.5 MG tablet, Take 1 tablet by mouth 3 (three) times daily., Disp: 270 tablet, Rfl: 3 .  LEVEMIR FLEXTOUCH 100 UNIT/ML FlexPen, INJECT 30 UNITS UNDER THE SKIN EVERY NIGHT AT BEDTIME AS DIRECTED PER SLIDING SCALE PROTOCOL, Disp: 15 mL, Rfl: 2 .  magnesium oxide (MAG-OX) 400 MG tablet, Take 400 mg by mouth daily. , Disp: , Rfl:  .  metoprolol succinate (TOPROL-XL) 50 MG 24 hr tablet, TAKE 1 TABLET BY MOUTH EVERY DAY, Disp: 90 tablet, Rfl: 0 .  mirtazapine (REMERON) 7.5 MG tablet, TAKE 1 TABLET(7.5 MG) BY MOUTH AT BEDTIME, Disp: 90 tablet, Rfl: 1 .  omeprazole (PRILOSEC) 40 MG capsule, TAKE 1 CAPSULE BY MOUTH DAILY BEFORE A MEAL, Disp: 90 capsule, Rfl: 1 .  oxybutynin (DITROPAN) 5 MG tablet, TAKE 1 TABLET(5 MG) BY MOUTH TWICE DAILY, Disp: 180 tablet, Rfl: 1 .  potassium chloride SA (KLOR-CON) 20 MEQ tablet, TAKE 2 TABLETS(40 MEQ) BY MOUTH DAILY, Disp: 180 tablet, Rfl: 1 .  pregabalin (LYRICA) 75 MG capsule, TAKE ONE CAPSULE BY MOUTH AT BEDTIME, Disp: 90 capsule, Rfl: 1 .  Semaglutide,0.25 or 0.5MG/DOS, (OZEMPIC, 0.25 OR 0.5 MG/DOSE,) 2 MG/1.5ML SOPN, Inject 0.25 mg into the skin., Disp: , Rfl:  .  torsemide (DEMADEX) 20 MG tablet, Take 1 tablet (20 mg total) by mouth 2 (two) times daily. You may take an extra 20 mg tablet daily as needed for swelling, Disp: 180 tablet, Rfl: 2 .  traMADol (ULTRAM) 50 MG tablet, Take 1 tablet (50 mg total) by mouth every 12 (twelve) hours as needed., Disp: 30 tablet, Rfl: 0 Allergies  Allergen Reactions  . Aspirin Itching  . Penicillins Itching and Rash    DID THE REACTION INVOLVE: Swelling  of the face/tongue/throat, SOB, or low BP? Yes Sudden or severe rash/hives, skin peeling, or the inside of the mouth or nose? No Did it require medical treatment? Yes When did it last happen?"More than 10 years ago" If all above answers are "NO", may proceed with cephalosporin use.    Social History   Occupational History  . Occupation: retired  Tobacco Use  . Smoking status: Never Smoker  . Smokeless tobacco: Never Used  Vaping Use  . Vaping Use: Never used  Substance and Sexual Activity  . Alcohol use: No  . Drug use: No  . Sexual activity: Not Currently    Objective:  Constitutional [name] is a pleasant 37 y.o. African American female, obese in NAD.[name] AAO x 3.   Vascular Capillary fill time to digits <3 seconds b/l lower extremities. Palpable pedal pulses b/l LE. Pedal hair absent. Lower extremity skin temperature gradient within normal limits. Nonpitting edema noted left ankle and right ankle.  No cyanosis or clubbing noted.  Neurologic Normal speech. Oriented to person, place, and time. Epicritic sensation to light touch grossly present bilaterally. Protective sensation intact 5/5 intact bilaterally with 10g monofilament b/l.  Dermatologic Pedal skin with normal turgor, texture and tone bilaterally. No open wounds bilaterally. No interdigital macerations bilaterally. Toenails 1-5 b/l elongated, discolored, dystrophic, thickened, crumbly with subungual debris and tenderness to dorsal palpation. Area of postinflammatory hyperpigmentation posteromedial aspect left heel. No erythema, no edema, no warmth, no fluctuance. No callus on today's visit.  Orthopedic: Normal muscle strength 5/5 to all lower extremity muscle groups bilaterally. No pain crepitus or joint limitation noted with ROM b/l. Hallux valgus with bunion deformity noted b/l lower extremities. Hammertoes noted to the 2-5 bilaterally. Utilizes wheelchair for mobility assistance. Severe genu valgum of right knee with  brace intact.    Radiographs: None Assessment:   1. Pain due to onychomycosis of toenails of both feet   2. Type 2 diabetes mellitus with stage 4 chronic kidney disease, with long-term current use of insulin ([place])    Plan:  Patient was evaluated and treated and all questions answered.  Onychomycosis with pain -Nails palliatively debridement as below -Educated on self-care  Procedure: Nail Debridement Rationale: Pain Type of Debridement: manual, sharp debridement. Instrumentation: Nail nipper, rotary burr. Number of Nails: 10 -Examined patient. -No new findings. No new orders. -Toenails 1-5 b/l were debrided in length and girth with sterile nail nippers and dremel without iatrogenic bleeding.  -Patient to report any pedal injuries to medical professional immediately. -Advised patient on use of heel protector to prevent recurrence of heel callus. She declined the heel protectors we have here in the office. -Patient to continue soft, supportive shoe gear daily. -Patient/POA to call should there be question/concern in the interim.  Return in about 3 months (around 09/12/2020).  Marzetta Board, DPM

## 2020-06-15 DIAGNOSIS — I69351 Hemiplegia and hemiparesis following cerebral infarction affecting right dominant side: Secondary | ICD-10-CM | POA: Diagnosis not present

## 2020-06-15 DIAGNOSIS — E1122 Type 2 diabetes mellitus with diabetic chronic kidney disease: Secondary | ICD-10-CM | POA: Diagnosis not present

## 2020-06-15 DIAGNOSIS — Z86718 Personal history of other venous thrombosis and embolism: Secondary | ICD-10-CM | POA: Diagnosis not present

## 2020-06-15 DIAGNOSIS — I5032 Chronic diastolic (congestive) heart failure: Secondary | ICD-10-CM | POA: Diagnosis not present

## 2020-06-15 DIAGNOSIS — E1165 Type 2 diabetes mellitus with hyperglycemia: Secondary | ICD-10-CM | POA: Diagnosis not present

## 2020-06-15 DIAGNOSIS — Z794 Long term (current) use of insulin: Secondary | ICD-10-CM | POA: Diagnosis not present

## 2020-06-15 DIAGNOSIS — M1990 Unspecified osteoarthritis, unspecified site: Secondary | ICD-10-CM | POA: Diagnosis not present

## 2020-06-15 DIAGNOSIS — I130 Hypertensive heart and chronic kidney disease with heart failure and stage 1 through stage 4 chronic kidney disease, or unspecified chronic kidney disease: Secondary | ICD-10-CM | POA: Diagnosis not present

## 2020-06-15 DIAGNOSIS — N183 Chronic kidney disease, stage 3 unspecified: Secondary | ICD-10-CM | POA: Diagnosis not present

## 2020-06-15 DIAGNOSIS — Z7901 Long term (current) use of anticoagulants: Secondary | ICD-10-CM | POA: Diagnosis not present

## 2020-06-15 DIAGNOSIS — D649 Anemia, unspecified: Secondary | ICD-10-CM | POA: Diagnosis not present

## 2020-06-15 DIAGNOSIS — Z950 Presence of cardiac pacemaker: Secondary | ICD-10-CM | POA: Diagnosis not present

## 2020-06-17 ENCOUNTER — Other Ambulatory Visit: Payer: Self-pay | Admitting: Internal Medicine

## 2020-06-17 ENCOUNTER — Ambulatory Visit: Payer: Medicare Other

## 2020-06-17 DIAGNOSIS — Z7901 Long term (current) use of anticoagulants: Secondary | ICD-10-CM | POA: Diagnosis not present

## 2020-06-17 DIAGNOSIS — I130 Hypertensive heart and chronic kidney disease with heart failure and stage 1 through stage 4 chronic kidney disease, or unspecified chronic kidney disease: Secondary | ICD-10-CM | POA: Diagnosis not present

## 2020-06-17 DIAGNOSIS — E1122 Type 2 diabetes mellitus with diabetic chronic kidney disease: Secondary | ICD-10-CM

## 2020-06-17 DIAGNOSIS — E1165 Type 2 diabetes mellitus with hyperglycemia: Secondary | ICD-10-CM | POA: Diagnosis not present

## 2020-06-17 DIAGNOSIS — Z794 Long term (current) use of insulin: Secondary | ICD-10-CM

## 2020-06-17 DIAGNOSIS — Z950 Presence of cardiac pacemaker: Secondary | ICD-10-CM | POA: Diagnosis not present

## 2020-06-17 DIAGNOSIS — N183 Chronic kidney disease, stage 3 unspecified: Secondary | ICD-10-CM | POA: Diagnosis not present

## 2020-06-17 DIAGNOSIS — D649 Anemia, unspecified: Secondary | ICD-10-CM | POA: Diagnosis not present

## 2020-06-17 DIAGNOSIS — I5032 Chronic diastolic (congestive) heart failure: Secondary | ICD-10-CM | POA: Diagnosis not present

## 2020-06-17 DIAGNOSIS — I69351 Hemiplegia and hemiparesis following cerebral infarction affecting right dominant side: Secondary | ICD-10-CM | POA: Diagnosis not present

## 2020-06-17 DIAGNOSIS — Z86718 Personal history of other venous thrombosis and embolism: Secondary | ICD-10-CM | POA: Diagnosis not present

## 2020-06-17 DIAGNOSIS — M1990 Unspecified osteoarthritis, unspecified site: Secondary | ICD-10-CM | POA: Diagnosis not present

## 2020-06-17 NOTE — Patient Instructions (Signed)
Social Worker Visit Information  Goals we discussed today:  Goals Addressed            This Visit's Progress    COMPLETED: Collaborate with RN Care Manager to perform appropriate assessments to assist with care management and care coordination needs       CARE PLAN ENTRY (see longitudinal plan of care for additional care plan information)  Current Barriers:   Transportation  Limited access to caregiver  Inability to perform ADL's independently  Inability to perform IADL's independently  Physical decline directly impacted by DM II, CHF, and Malignant Neoplasm  Knowledge deficits related to management of chronic conditions   Social Work Clinical Goal(s):   Over the next 10 days the patient will attend appointment with primary care provider as previously scheduled to address hyperglycemia Goal Met  Over the next 30 days the patient will work with SW to review health plan benefits including transportation to and from physician appointments Goal Met  Over the next 45 days the patient will work with primary care provider as well as care management team to better manage DM II as evidenced by improvement of self reported fasting blood sugars  CCM SW Interventions: Completed 06/17/20  Successful outbound call placed to the patient to assess for care coordination needs  Determined the patient is still in need of diabetic shoes o Reminded the patient she would need to contact her podiatrist to obtain a prescription o The patient indicated she saw her podiatrist "last Friday" and will contact the office for assistance  No further care coordination needs identified  Goal closed  Completed 04/17/20  Successful outbound call placed to the patient to assess for goal progression  Confirmed patient attended OV with [name]  Reviewed patient transportation benefit offered under health plan o Confirmed patient knowledge how to contact health plan to arrange transportation for  future needs o Reminded patient of need to arrange transportation a minimum of 3 business days prior to appointment  Determined the patient is continuing to have difficulty managing blood sugar o Chart reviewed to note [place] PharmD call scheduled for 7/12 and outreach by RN Care Manager scheduled for 7/20 o Advised the patient of upcomming appointments and plans for both clinicians to assist with disease management  Scheduled follow up call over the next 6 weeks to assist with ongoing care coordination needs  Completed 03/31/20  Inter-disciplinary care team collaboration (see longitudinal plan of care)  Performed chart review  o Noted contact with cardiology team 5/27 to report increased SOB on exertion with swelling to lower legs o Cardiology team instructed patient to increase Torsemide by $[id number]$  and contact office if problems persist  Successful outbound call placed to the patient who reports SOB has improved o Patient reports she feels the SOB was a result of catching a cold from her grand-children o Patient reports cold like symptoms are improving   Discussed patient management of diabetes and goal to check CBG 3 times dails o Patient reports her fasting BS readings have been elevated for several days without known cause o Reported readings ranging from 200-300 with this mornings FSBS over 300 o Patient denies symptoms of hyperglycemia or snacking throughout the night  Advised patient SW would collaborate with [name] regarding increase in readings  Reminded patient of [place] appointment to see [name] on Thursday 6/10 o Patient reports she is not sure if she has secured transportation for this appointment o Provided date and time for the patient  to discuss with her daughter, [name], who assists with transportation arrangements o Encouraged the patient to contact SW as needed for transportation resources  Patient reports continued engagement with [place] to  address Malignant Neoplasm o Patient continues with oral chemo. No nausea reported by the patient o Next appointment planned for July o Encouraged the patient to contact oncologist with concerns as needed prior to next appointment  Informed by the patient she is pureeing meat with meals due to concern with meat becoming stuck in her throat o Advised the patient to speak with [name] about this at [place] appointment  Collaboration with [name] and RN Care Manager to provide patient update  Patient Self Care Activities:   Patient verbalizes understanding of plan to work with PCP and care management team to address ongoing care management needs  Calls provider office for new concerns or questions  Unable to perform ADLs independently  Unable to perform IADLs independently  Please see past updates related to this goal by clicking on the "Past Updates" button in the selected goal          Follow Up Plan: No SW follow up planned at this time. The patient will remain active with RN Care Manager.   [name], BSW, CDP Social Worker, Certified Dementia Practitioner [place] [facility] [place] [facility] [phone]

## 2020-06-17 NOTE — Chronic Care Management (AMB) (Signed)
Chronic Care Management    Social Work Follow Up Note  06/17/2020 Name: [name] MRN: [id number] DOB: 1933/01/12  [name] is a 86 y.o. year old female who is a primary care patient of [name], MD. The CCM team was consulted for assistance with care coordination.   Review of patient status, including review of consultants reports, other relevant assessments, and collaboration with appropriate care team members and the patient's provider was performed as part of comprehensive patient evaluation and provision of chronic care management services.    SDOH (Social Determinants of Health) assessments performed: No    Outpatient Encounter Medications as of 06/17/2020  Medication Sig Note  . ACCU-CHEK AVIVA PLUS test strip USE AS DIRECTED TO CHECK BLOOD SUGAR FOUR TIMES DAILY   . Accu-Chek Softclix Lancets lancets Use as instructed to check blood sugars 4 times per day dx: e11.65   . acetaminophen (TYLENOL) 500 MG tablet Take 1,000 mg by mouth every 6 (six) hours as needed for mild pain, moderate pain or headache.    [name] amiodarone (PACERONE) 200 MG tablet Take one tablet by mouth daily Monday-Saturday. Do NOT take Sunday   . anastrozole (ARIMIDEX) 1 MG tablet TAKE 1 TABLET(1 MG) BY MOUTH DAILY   . apixaban (ELIQUIS) 2.5 MG TABS tablet Take 1 tablet (2.5 mg total) by mouth 2 (two) times daily.   [name] atorvastatin (LIPITOR) 20 MG tablet Take 1 tablet (20 mg total) by mouth at bedtime.   . BD PEN NEEDLE NANO 2ND GEN 32G X 4 MM MISC USE AS DIRECTED   . Blood Glucose Monitoring Suppl (ACCU-CHEK AVIVA PLUS) w/Device KIT USE AS DIRECTED TO CHECK BLOOD SUGAR FOUR TIMES DAILY dx: e11.65   . calcium carbonate (OS-CAL) 600 MG TABS tablet Take 600 mg by mouth daily with breakfast.    . carvedilol (COREG) 12.5 MG tablet TAKE 1 TABLET(12.5 MG) BY MOUTH TWICE DAILY   . Cholecalciferol (VITAMIN D-3 PO) Take 1 capsule by mouth daily after breakfast.   . Cyanocobalamin (B-12 PO) Take 1 tablet by mouth  daily after breakfast.  04/06/2020: Patient instructed to hold Cyanocobalamin per [name] for elevated B12.   . diclofenac sodium (VOLTAREN) 1 % GEL Apply 2 g topically 4 (four) times daily. (Patient taking differently: Apply 2 g topically See admin instructions. Apply 2 grams to knees at bedtime and an additional 2 grams three times a day as needed for pain)   . ferrous sulfate 325 (65 FE) MG tablet Take 325 mg by mouth daily with breakfast.   . HUMALOG KWIKPEN 200 UNIT/ML KwikPen INJECT 3 UNITS UNDER THE SKIN BEFORE BREAKFAST, LUNCH, DINNER IF SUGARS ARE GREATER THAN 175.   . insulin detemir (LEVEMIR) 100 UNIT/ML injection Take 16 units in the morning and 10 units in the evening.   . isosorbide-hydrALAZINE (BIDIL) 20-37.5 MG tablet Take 1 tablet by mouth 3 (three) times daily.   [name] LEVEMIR FLEXTOUCH 100 UNIT/ML FlexPen INJECT 30 UNITS UNDER THE SKIN EVERY NIGHT AT BEDTIME AS DIRECTED PER SLIDING SCALE PROTOCOL   . magnesium oxide (MAG-OX) 400 MG tablet Take 400 mg by mouth daily.  04/06/2020: Patient ran out of this medication but plans to refill and restart.   . metoprolol succinate (TOPROL-XL) 50 MG 24 hr tablet TAKE 1 TABLET BY MOUTH EVERY DAY   . mirtazapine (REMERON) 7.5 MG tablet TAKE 1 TABLET(7.5 MG) BY MOUTH AT BEDTIME   . omeprazole (PRILOSEC) 40 MG capsule TAKE 1 CAPSULE BY MOUTH DAILY  BEFORE A MEAL   . oxybutynin (DITROPAN) 5 MG tablet TAKE 1 TABLET(5 MG) BY MOUTH TWICE DAILY   . potassium chloride SA (KLOR-CON) 20 MEQ tablet TAKE 2 TABLETS(40 MEQ) BY MOUTH DAILY   . pregabalin (LYRICA) 75 MG capsule TAKE ONE CAPSULE BY MOUTH AT BEDTIME   . Semaglutide,0.25 or 0.5MG/DOS, (OZEMPIC, 0.25 OR 0.5 MG/DOSE,) 2 MG/1.5ML SOPN Inject 0.25 mg into the skin.   [name] torsemide (DEMADEX) 20 MG tablet Take 1 tablet (20 mg total) by mouth 2 (two) times daily. You may take an extra 20 mg tablet daily as needed for swelling   . traMADol (ULTRAM) 50 MG tablet Take 1 tablet (50 mg total) by mouth every 12  (twelve) hours as needed.    No facility-administered encounter medications on file as of 06/17/2020.     Goals Addressed            This Visit's Progress   . COMPLETED: Collaborate with RN Care Manager to perform appropriate assessments to assist with care management and care coordination needs       CARE PLAN ENTRY (see longitudinal plan of care for additional care plan information)  Current Barriers:  . Transportation . Limited access to caregiver . Inability to perform ADL's independently . Inability to perform IADL's independently . Physical decline directly impacted by DM II, CHF, and Malignant Neoplasm . Knowledge deficits related to management of chronic conditions   Social Work Clinical Goal(s):  [name] Over the next 10 days the patient will attend appointment with primary care provider as previously scheduled to address hyperglycemia Goal Met . Over the next 30 days the patient will work with SW to review health plan benefits including transportation to and from physician appointments Goal Met . Over the next 45 days the patient will work with primary care provider as well as care management team to better manage DM II as evidenced by improvement of self reported fasting blood sugars  CCM SW Interventions: Completed 06/17/20 . Successful outbound call placed to the patient to assess for care coordination needs . Determined the patient is still in need of diabetic shoes o Reminded the patient she would need to contact her podiatrist to obtain a prescription o The patient indicated she saw her podiatrist "last Friday" and will contact the office for assistance . No further care coordination needs identified . Goal closed  Completed 04/17/20 . Successful outbound call placed to the patient to assess for goal progression . Confirmed patient attended OV with [name] . Reviewed patient transportation benefit offered under health plan o Confirmed patient knowledge how to  contact health plan to arrange transportation for future needs o Reminded patient of need to arrange transportation a minimum of 3 business days prior to appointment . Determined the patient is continuing to have difficulty managing blood sugar o Chart reviewed to note [place] PharmD call scheduled for 7/12 and outreach by [place] scheduled for 7/20 o Advised the patient of [place] appointments and plans for both clinicians to assist with disease management . Scheduled follow up call over the next 6 weeks to assist with ongoing care coordination needs  Completed 03/31/20 . Inter-disciplinary care team collaboration (see longitudinal plan of care) . Performed chart review  o Noted contact with cardiology team 5/27 to report increased SOB on exertion with swelling to lower legs o Cardiology team instructed patient to increase Torsemide by 61m and contact office if problems persist . Successful outbound call placed to the patient  who reports SOB has improved o Patient reports she feels the SOB was a result of catching a cold from her grand-children o Patient reports cold like symptoms are improving  . Discussed patient management of diabetes and goal to check CBG 3 times dails o Patient reports her fasting BS readings have been elevated for several days without known cause o Reported readings ranging from 200-300 with this mornings FSBS over 300 o Patient denies symptoms of hyperglycemia or snacking throughout the night . Advised patient SW would collaborate with [name] regarding increase in readings . Reminded patient of upcomming appointment to see [name] on Thursday 6/10 o Patient reports she is not sure if she has secured transportation for this appointment o Provided date and time for the patient to discuss with her daughter, [name], who assists with transportation arrangements o Encouraged the patient to contact SW as needed for transportation resources . Patient  reports continued engagement with [place] to address Malignant Neoplasm o Patient continues with oral chemo. No nausea reported by the patient o Next appointment planned for July o Encouraged the patient to contact oncologist with concerns as needed prior to next appointment . Informed by the patient she is pureeing meat with meals due to concern with meat becoming stuck in her throat o Advised the patient to speak with [name] about this at [place] appointment . Collaboration with [name] and RN Care Manager to provide patient update  Patient Self Care Activities:  . Patient verbalizes understanding of plan to work with PCP and care management team to address ongoing care management needs . Calls provider office for new concerns or questions . Unable to perform ADLs independently . Unable to perform IADLs independently  Please see past updates related to this goal by clicking on the "Past Updates" button in the selected goal          Follow Up Plan: No SW follow up planned at this time. The patient will remain active with RN Care Manager.   [name], BSW, CDP Social Worker, Certified Dementia Practitioner [place] [facility] [place] [facility] [phone]  Total time spent performing care coordination and/or care management activities with the patient by phone or face to face = 8 minutes.

## 2020-06-19 ENCOUNTER — Encounter (HOSPITAL_BASED_OUTPATIENT_CLINIC_OR_DEPARTMENT_OTHER): Payer: Self-pay | Admitting: Emergency Medicine

## 2020-06-19 DIAGNOSIS — E860 Dehydration: Secondary | ICD-10-CM | POA: Diagnosis not present

## 2020-06-19 DIAGNOSIS — Z79811 Long term (current) use of aromatase inhibitors: Secondary | ICD-10-CM | POA: Diagnosis not present

## 2020-06-19 DIAGNOSIS — N1832 Chronic kidney disease, stage 3b: Secondary | ICD-10-CM | POA: Diagnosis not present

## 2020-06-19 DIAGNOSIS — E1122 Type 2 diabetes mellitus with diabetic chronic kidney disease: Secondary | ICD-10-CM | POA: Insufficient documentation

## 2020-06-19 DIAGNOSIS — U071 COVID-19: Secondary | ICD-10-CM | POA: Insufficient documentation

## 2020-06-19 DIAGNOSIS — Z7901 Long term (current) use of anticoagulants: Secondary | ICD-10-CM | POA: Diagnosis not present

## 2020-06-19 DIAGNOSIS — I471 Supraventricular tachycardia: Secondary | ICD-10-CM | POA: Diagnosis not present

## 2020-06-19 DIAGNOSIS — J1282 Pneumonia due to coronavirus disease 2019: Secondary | ICD-10-CM | POA: Diagnosis not present

## 2020-06-19 DIAGNOSIS — N183 Chronic kidney disease, stage 3 unspecified: Secondary | ICD-10-CM | POA: Insufficient documentation

## 2020-06-19 DIAGNOSIS — I5022 Chronic systolic (congestive) heart failure: Secondary | ICD-10-CM | POA: Insufficient documentation

## 2020-06-19 DIAGNOSIS — Z950 Presence of cardiac pacemaker: Secondary | ICD-10-CM | POA: Diagnosis not present

## 2020-06-19 DIAGNOSIS — R05 Cough: Secondary | ICD-10-CM | POA: Diagnosis not present

## 2020-06-19 DIAGNOSIS — I447 Left bundle-branch block, unspecified: Secondary | ICD-10-CM | POA: Diagnosis not present

## 2020-06-19 DIAGNOSIS — R197 Diarrhea, unspecified: Secondary | ICD-10-CM | POA: Diagnosis not present

## 2020-06-19 DIAGNOSIS — Z79899 Other long term (current) drug therapy: Secondary | ICD-10-CM | POA: Insufficient documentation

## 2020-06-19 DIAGNOSIS — R778 Other specified abnormalities of plasma proteins: Secondary | ICD-10-CM | POA: Diagnosis not present

## 2020-06-19 DIAGNOSIS — Z794 Long term (current) use of insulin: Secondary | ICD-10-CM | POA: Diagnosis not present

## 2020-06-19 DIAGNOSIS — R55 Syncope and collapse: Secondary | ICD-10-CM | POA: Diagnosis not present

## 2020-06-19 DIAGNOSIS — E1165 Type 2 diabetes mellitus with hyperglycemia: Secondary | ICD-10-CM | POA: Diagnosis not present

## 2020-06-19 DIAGNOSIS — D61818 Other pancytopenia: Secondary | ICD-10-CM | POA: Diagnosis not present

## 2020-06-19 DIAGNOSIS — I517 Cardiomegaly: Secondary | ICD-10-CM | POA: Diagnosis not present

## 2020-06-19 DIAGNOSIS — Z853 Personal history of malignant neoplasm of breast: Secondary | ICD-10-CM | POA: Insufficient documentation

## 2020-06-19 DIAGNOSIS — Z86718 Personal history of other venous thrombosis and embolism: Secondary | ICD-10-CM | POA: Diagnosis not present

## 2020-06-19 DIAGNOSIS — G894 Chronic pain syndrome: Secondary | ICD-10-CM | POA: Diagnosis not present

## 2020-06-19 DIAGNOSIS — I951 Orthostatic hypotension: Secondary | ICD-10-CM | POA: Diagnosis not present

## 2020-06-19 DIAGNOSIS — I5042 Chronic combined systolic (congestive) and diastolic (congestive) heart failure: Secondary | ICD-10-CM | POA: Diagnosis not present

## 2020-06-19 DIAGNOSIS — R531 Weakness: Secondary | ICD-10-CM | POA: Diagnosis not present

## 2020-06-19 DIAGNOSIS — E876 Hypokalemia: Secondary | ICD-10-CM | POA: Diagnosis not present

## 2020-06-19 DIAGNOSIS — Z8673 Personal history of transient ischemic attack (TIA), and cerebral infarction without residual deficits: Secondary | ICD-10-CM | POA: Insufficient documentation

## 2020-06-19 DIAGNOSIS — I130 Hypertensive heart and chronic kidney disease with heart failure and stage 1 through stage 4 chronic kidney disease, or unspecified chronic kidney disease: Secondary | ICD-10-CM | POA: Insufficient documentation

## 2020-06-19 DIAGNOSIS — E11649 Type 2 diabetes mellitus with hypoglycemia without coma: Secondary | ICD-10-CM | POA: Diagnosis not present

## 2020-06-19 MED ORDER — ACETAMINOPHEN 325 MG PO TABS
650.0000 mg | ORAL_TABLET | Freq: Once | ORAL | Status: AC
  Administered 2020-06-19: 650 mg via ORAL

## 2020-06-19 MED ORDER — ACETAMINOPHEN 325 MG PO TABS
ORAL_TABLET | ORAL | Status: AC
  Filled 2020-06-19: qty 2

## 2020-06-19 NOTE — ED Triage Notes (Signed)
P/o cough, headache, chills, body aches x 2 days.

## 2020-06-20 ENCOUNTER — Emergency Department (HOSPITAL_BASED_OUTPATIENT_CLINIC_OR_DEPARTMENT_OTHER)
Admission: EM | Admit: 2020-06-20 | Discharge: 2020-06-20 | Disposition: A | Discharge status: Home / Self Care | Payer: Medicare Other | Source: Home / Self Care | Attending: Emergency Medicine | Admitting: Emergency Medicine

## 2020-06-20 ENCOUNTER — Telehealth: Payer: Self-pay | Admitting: Unknown Physician Specialty

## 2020-06-20 ENCOUNTER — Emergency Department (HOSPITAL_BASED_OUTPATIENT_CLINIC_OR_DEPARTMENT_OTHER): Payer: Medicare Other

## 2020-06-20 ENCOUNTER — Other Ambulatory Visit: Payer: Self-pay

## 2020-06-20 ENCOUNTER — Other Ambulatory Visit: Payer: Self-pay | Admitting: Unknown Physician Specialty

## 2020-06-20 DIAGNOSIS — I5041 Acute combined systolic (congestive) and diastolic (congestive) heart failure: Secondary | ICD-10-CM

## 2020-06-20 DIAGNOSIS — U071 COVID-19: Secondary | ICD-10-CM

## 2020-06-20 DIAGNOSIS — I10 Essential (primary) hypertension: Secondary | ICD-10-CM

## 2020-06-20 LAB — SARS CORONAVIRUS 2 BY RT PCR (HOSPITAL ORDER, PERFORMED IN ~~LOC~~ HOSPITAL LAB): SARS Coronavirus 2: POSITIVE — AB

## 2020-06-20 NOTE — Telephone Encounter (Signed)
I connected by phone with Stellarose Cerny Jakes's daughter on 06/20/2020 at 8:54 AM to discuss the potential use of a new treatment for mild to moderate COVID-19 viral infection in non-hospitalized patients.  This patient is a 86 y.o. female that meets the FDA criteria for Emergency Use Authorization of COVID monoclonal antibody casirivimab/imdevimab.  Has a (+) direct SARS-CoV-2 viral test result  Has mild or moderate COVID-19   Is NOT hospitalized due to COVID-19  Is within 10 days of symptom onset  Has at least one of the high risk factor(s) for progression to severe COVID-19 and/or hospitalization as defined in EUA.  Specific high risk criteria : Older age (>/= 84 yo), BMI > 25, Diabetes, Cardiovascular disease or hypertension and Chronic Lung Disease   I have spoken and communicated the following to the patient or parent/caregiver regarding COVID monoclonal antibody treatment:  1. FDA has authorized the emergency use for the treatment of mild to moderate COVID-19 in adults and pediatric patients with positive results of direct SARS-CoV-2 viral testing who are 31 years of age and older weighing at least 40 kg, and who are at high risk for progressing to severe COVID-19 and/or hospitalization.  2. The significant known and potential risks and benefits of COVID monoclonal antibody, and the extent to which such potential risks and benefits are unknown.  3. Information on available alternative treatments and the risks and benefits of those alternatives, including clinical trials.  4. Patients treated with COVID monoclonal antibody should continue to self-isolate and use infection control measures (e.g., wear mask, isolate, social distance, avoid sharing personal items, clean and disinfect "high touch" surfaces, and frequent handwashing) according to CDC guidelines.   5. The patient or parent/caregiver has the option to accept or refuse COVID monoclonal antibody treatment.  After reviewing this  information with the patient, The patient agreed to proceed with receiving casirivimab\imdevimab infusion and will be provided a copy of the Fact sheet prior to receiving the infusion. Kathrine Haddock 06/20/2020 8:54 AM Sx onset 8/27.  No oxygen

## 2020-06-20 NOTE — Discharge Instructions (Signed)
We will have the outpatient monoclonal antibody infusion clinic contact you regarding outpatient treatment for your Covid infection.

## 2020-06-20 NOTE — ED Notes (Signed)
Positive covid result received, Dr Florina Ou aware

## 2020-06-20 NOTE — ED Provider Notes (Signed)
[facility] Provider Note: Georgena Spurling, MD, FACEP  CSN: [id number] MRN: [id number] ARRIVAL: 06/19/20 at [place]: [place]  06/20/20 2:10 AM [name] is a 86 y.o. female with a 2-day history of cough, headache, chills and body aches. Her temperature was 100.2 on arrival and she was given Tylenol. She states she feels like she has been run over by a truck. She has some mild shortness of breath with this as well. She has not been hypoxic in the ED. She denies vomiting or diarrhea. Her chest is sore when she coughs, and she rates it as a 6 out of 10. She has had a recent Covid exposure in the family.   Past Medical History:  Diagnosis Date   Abnormal echocardiogram    a. possible mass on echo 05/2019 on PPM, b. 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 Previously diastolic but patient AVS from outside hospital listed systolic CHF and cardiomyopathy, records pending.   CKD (chronic kidney disease), stage III    Clotting disorder (HCC)    CVA (cerebral vascular accident) ([place])    residual right sided weakness and mild dysphagia   Diabetes mellitus ([place])    Dizziness and giddiness    DVT (deep venous thrombosis) ([place])    a. on anticoagulation for this.   Essential hypertension    LBBB (left bundle branch block)    Mitral regurgitation    a. Mod MR by echo 2014.   Muscular deconditioning    Obesity    Pacemaker    Pericardial effusion    SVT (supraventricular tachycardia) ([place])    a. In 2013 she had an EPS with ablation for SVT which did not eliminate the SVT completely. She also had bradycardia which limited medication. She was placed on amiodarone by Dr.  Lovena Le.   Thrombocytopenia ([place]) 11/21/2011    Past Surgical History:  Procedure Laterality Date   EYE SURGERY     SUPRAVENTRICULAR TACHYCARDIA ABLATION N/A 10/11/2012   Procedure: SUPRAVENTRICULAR TACHYCARDIA ABLATION;  Surgeon: Evans Lance, MD;  Location: [facility] CATH LAB;  Service: Cardiovascular;  Laterality: N/A;    Family History  Problem Relation Age of Onset   Stroke Mother    Cancer Father        prostate   Hypertension Daughter    Heart attack Brother        x2   Hypertension Brother    Stroke Sister    Hypertension Sister    Breast cancer Maternal Aunt     Social History   Tobacco Use   Smoking status: Never Smoker   Smokeless tobacco: Never Used  Scientific laboratory technician Use: Never used  Substance Use Topics   Alcohol use: No   Drug use: No    Prior to Admission medications   Medication Sig Start Date End Date Taking? Authorizing Provider  ACCU-CHEK AVIVA PLUS test strip USE AS DIRECTED TO CHECK BLOOD SUGAR FOUR TIMES DAILY 12/11/19   [name], MD  Accu-Chek Softclix Lancets lancets Use as instructed to check blood sugars 4 times per day dx: e11.65 04/06/20   Baird Cancer,  Bailey Mech, MD  acetaminophen (TYLENOL) 500 MG tablet Take 1,000 mg by mouth every 6 (six) hours as needed for mild pain, moderate pain or headache.     [provider]  amiodarone (PACERONE) 200 MG tablet Take one tablet by mouth daily Monday-Saturday. Do NOT take Sunday 05/06/20   Evans Lance, MD  anastrozole (ARIMIDEX) 1 MG tablet TAKE 1 TABLET(1 MG) BY MOUTH DAILY 01/15/20   Nicholas Lose, MD  apixaban (ELIQUIS) 2.5 MG TABS tablet Take 1 tablet (2.5 mg total) by mouth 2 (two) times daily. 09/12/19   Evans Lance, MD  atorvastatin (LIPITOR) 20 MG tablet Take 1 tablet (20 mg total) by mouth at bedtime. 06/14/19   [name], MD  BD PEN NEEDLE NANO 2ND GEN 32G X 4 MM MISC USE AS DIRECTED 04/17/20   [name], MD  Blood Glucose Monitoring Suppl (ACCU-CHEK AVIVA PLUS)  w/Device KIT USE AS DIRECTED TO CHECK BLOOD SUGAR FOUR TIMES DAILY dx: e11.65 04/06/20   [name], MD  calcium carbonate (OS-CAL) 600 MG TABS tablet Take 600 mg by mouth daily with breakfast.     [provider]  carvedilol (COREG) 12.5 MG tablet TAKE 1 TABLET(12.5 MG) BY MOUTH TWICE DAILY 10/08/19   Lyda Jester M, PA-C  Cholecalciferol (VITAMIN D-3 PO) Take 1 capsule by mouth daily after breakfast.    [provider]  Cyanocobalamin (B-12 PO) Take 1 tablet by mouth daily after breakfast.     [provider]  diclofenac sodium (VOLTAREN) 1 % GEL Apply 2 g topically 4 (four) times daily. Patient taking differently: Apply 2 g topically See admin instructions. Apply 2 grams to knees at bedtime and an additional 2 grams three times a day as needed for pain 10/30/18   Rodriguez[place], Sunday Spillers, PA-C  ferrous sulfate 325 (65 FE) MG tablet Take 325 mg by mouth daily with breakfast.    [provider]  HUMALOG KWIKPEN 200 UNIT/ML KwikPen INJECT 3 UNITS UNDER THE SKIN BEFORE BREAKFAST, LUNCH, DINNER IF SUGARS ARE GREATER THAN 175. 02/11/20   [name], MD  insulin detemir (LEVEMIR) 100 UNIT/ML injection Take 16 units in the morning and 10 units in the evening. 09/17/19   Minette Brine, FNP  isosorbide-hydrALAZINE (BIDIL) 20-37.5 MG tablet Take 1 tablet by mouth 3 (three) times daily. 02/28/20   Jettie Booze, MD  LEVEMIR FLEXTOUCH 100 UNIT/ML FlexPen INJECT 30 UNITS UNDER THE SKIN EVERY NIGHT AT BEDTIME AS DIRECTED PER SLIDING SCALE PROTOCOL 02/11/20   [name], MD  magnesium oxide (MAG-OX) 400 MG tablet Take 400 mg by mouth daily.     [provider]  metoprolol succinate (TOPROL-XL) 50 MG 24 hr tablet TAKE 1 TABLET BY MOUTH EVERY DAY 12/11/19   Minette Brine, FNP  mirtazapine (REMERON) 7.5 MG tablet TAKE 1 TABLET(7.5 MG) BY MOUTH AT BEDTIME 04/14/20   [name], MD  omeprazole (PRILOSEC) 40 MG capsule TAKE 1 CAPSULE BY MOUTH DAILY  BEFORE A MEAL 12/11/19   [name], MD  oxybutynin (DITROPAN) 5 MG tablet TAKE 1 TABLET(5 MG) BY MOUTH TWICE DAILY 12/23/19   [name], MD  potassium chloride SA (KLOR-CON) 20 MEQ tablet TAKE 2 TABLETS(40 MEQ) BY MOUTH DAILY 06/17/20   [name], MD  pregabalin (LYRICA) 75 MG capsule TAKE ONE CAPSULE BY MOUTH AT BEDTIME 04/06/20   [name], MD  Semaglutide,0.25 or 0.5MG/DOS, (OZEMPIC, 0.25 OR 0.5 MG/DOSE,) 2 MG/1.5ML SOPN Inject 0.25 mg into the skin.    [provider]  torsemide (DEMADEX) 20 MG tablet Take 1 tablet (20 mg total) by mouth 2 (two) times daily. You may take an extra 20 mg tablet daily as needed for swelling 01/14/20   Jettie Booze, MD  traMADol (ULTRAM) 50 MG tablet Take 1 tablet (50 mg total) by mouth every 12 (twelve) hours as needed. 05/19/20   [name], MD    Allergies Aspirin and Penicillins   REVIEW OF SYSTEMS  Negative except as noted here or in the History of Present Illness.   PHYSICAL EXAMINATION  Initial Vital Signs Blood pressure 122/66, pulse 80, temperature 100.2 F (37.9 C), temperature source Oral, resp. rate 20, height 5' 5" (1.651 m), weight 87.1 kg, SpO2 99 %.  Examination General: Well-developed, well-nourished female in no acute distress; appearance consistent with age of record HENT: normocephalic; atraumatic Eyes: pupils equal, round and reactive to light; extraocular muscles intact; arcus senilis bilaterally Neck: supple Heart: regular rate and rhythm Lungs: clear to auscultation bilaterally Abdomen: soft; nondistended; nontender; bowel sounds present Extremities: No deformity; chronic appearing edema of lower legs Neurologic: Awake, alert and oriented; motor function intact in all extremities and symmetric; no facial droop Skin: Warm and dry Psychiatric: Normal mood and affect   RESULTS  Summary of this visit's results, reviewed and interpreted by myself:   EKG Interpretation  Date/Time:     Ventricular Rate:    PR Interval:    QRS Duration:   QT Interval:    QTC Calculation:   R Axis:     Text Interpretation:        Laboratory Studies: Results for orders placed or performed during the hospital encounter of 06/20/20 (from the past 24 hour(s))  SARS Coronavirus 2 by RT PCR (hospital order, performed in [place] hospital lab) Nasopharyngeal Nasopharyngeal Swab     Status: Abnormal   Collection Time: 06/19/20 11:50 PM   Specimen: Nasopharyngeal Swab  Result Value Ref Range   SARS Coronavirus 2 POSITIVE (A) NEGATIVE   Imaging Studies: DG Chest Port 1 View  Result Date: 06/20/2020 CLINICAL DATA:  Cough and congestion EXAM: PORTABLE CHEST 1 VIEW COMPARISON:  08/19/2019 FINDINGS: Left-sided pacing device. Mild cardiomegaly with linear scarring at the left base. No consolidation or effusion. No pneumothorax. IMPRESSION: No active disease. Mild cardiomegaly. Electronically Signed   By: Donavan Foil M.D.   On: 06/20/2020 00:30    ED COURSE and MDM  Nursing notes, initial and subsequent vitals signs, including pulse oximetry, reviewed and interpreted by myself.  Vitals:   06/19/20 2345 06/19/20 2347  BP:  122/66  Pulse:  80  Resp:  20  Temp:  100.2 F (37.9 C)  TempSrc:  Oral  SpO2:  99%  Weight: 87.1 kg   Height: 5' 5" (1.651 m)    Medications  acetaminophen (TYLENOL) tablet 650 mg (650 mg Oral Given 06/19/20 2355)   Patient does not appear sick enough to admit to the hospital but we will contact the outpatient monoclonal antibody clinic for consideration of outpatient treatment.   PROCEDURES  Procedures   ED DIAGNOSES     ICD-10-CM   1. COVID-19  U07.1 DG Chest [facility] Chest [place] [address]       [place], [place], [place] 06/20/20 [phone]

## 2020-06-21 ENCOUNTER — Ambulatory Visit (HOSPITAL_COMMUNITY)
Admission: RE | Admit: 2020-06-21 | Discharge: 2020-06-21 | Disposition: A | Discharge status: Home / Self Care | Payer: Medicare Other | Source: Ambulatory Visit | Attending: Pulmonary Disease | Admitting: Pulmonary Disease

## 2020-06-21 DIAGNOSIS — Z23 Encounter for immunization: Secondary | ICD-10-CM | POA: Insufficient documentation

## 2020-06-21 DIAGNOSIS — I5041 Acute combined systolic (congestive) and diastolic (congestive) heart failure: Secondary | ICD-10-CM | POA: Insufficient documentation

## 2020-06-21 DIAGNOSIS — I10 Essential (primary) hypertension: Secondary | ICD-10-CM

## 2020-06-21 DIAGNOSIS — I110 Hypertensive heart disease with heart failure: Secondary | ICD-10-CM | POA: Insufficient documentation

## 2020-06-21 DIAGNOSIS — U071 COVID-19: Secondary | ICD-10-CM | POA: Insufficient documentation

## 2020-06-21 MED ORDER — METHYLPREDNISOLONE SODIUM SUCC 125 MG IJ SOLR: 125.0000 mg | Freq: Once | INTRAMUSCULAR | Status: DC | PRN

## 2020-06-21 MED ORDER — DIPHENHYDRAMINE HCL 50 MG/ML IJ SOLN: 50.0000 mg | Freq: Once | INTRAMUSCULAR | Status: DC | PRN

## 2020-06-21 MED ORDER — SODIUM CHLORIDE 0.9 % IV SOLN
1200.0000 mg | Freq: Once | INTRAVENOUS | Status: AC
  Administered 2020-06-21: 1200 mg via INTRAVENOUS

## 2020-06-21 MED ORDER — FAMOTIDINE IN NACL 20-0.9 MG/50ML-% IV SOLN: 20.0000 mg | Freq: Once | INTRAVENOUS | Status: DC | PRN

## 2020-06-21 MED ORDER — EPINEPHRINE 0.3 MG/0.3ML IJ SOAJ: 0.3000 mg | Freq: Once | INTRAMUSCULAR | Status: DC | PRN

## 2020-06-21 MED ORDER — SODIUM CHLORIDE 0.9 % IV SOLN: INTRAVENOUS | Status: DC | PRN

## 2020-06-21 MED ORDER — ALBUTEROL SULFATE HFA 108 (90 BASE) MCG/ACT IN AERS: 2.0000 | INHALATION_SPRAY | Freq: Once | RESPIRATORY_TRACT | Status: DC | PRN

## 2020-06-21 NOTE — Discharge Instructions (Signed)

## 2020-06-21 NOTE — Progress Notes (Signed)
  Diagnosis: COVID-19  Physician:Dr Wright   Procedure: Covid Infusion Clinic Med: casirivimab\imdevimab infusion - Provided patient with casirivimab\imdevimab fact sheet for patients, parents and caregivers prior to infusion.  Complications: No immediate complications noted.  Discharge: Discharged home   [name] 06/21/2020  

## 2020-06-22 ENCOUNTER — Emergency Department (HOSPITAL_COMMUNITY): Payer: Medicare Other

## 2020-06-22 ENCOUNTER — Inpatient Hospital Stay (HOSPITAL_COMMUNITY)
Admission: EM | Admit: 2020-06-22 | Discharge: 2020-07-01 | DRG: 177 | Disposition: A | Discharge status: Skilled Nursing Facility | Payer: Medicare Other | Attending: Internal Medicine | Admitting: Internal Medicine

## 2020-06-22 ENCOUNTER — Encounter (HOSPITAL_COMMUNITY): Payer: Self-pay | Admitting: Emergency Medicine

## 2020-06-22 DIAGNOSIS — I951 Orthostatic hypotension: Secondary | ICD-10-CM | POA: Diagnosis present

## 2020-06-22 DIAGNOSIS — R404 Transient alteration of awareness: Secondary | ICD-10-CM | POA: Diagnosis not present

## 2020-06-22 DIAGNOSIS — M6259 Muscle wasting and atrophy, not elsewhere classified, multiple sites: Secondary | ICD-10-CM | POA: Diagnosis not present

## 2020-06-22 DIAGNOSIS — E11649 Type 2 diabetes mellitus with hypoglycemia without coma: Secondary | ICD-10-CM | POA: Diagnosis not present

## 2020-06-22 DIAGNOSIS — R55 Syncope and collapse: Secondary | ICD-10-CM | POA: Diagnosis not present

## 2020-06-22 DIAGNOSIS — G894 Chronic pain syndrome: Secondary | ICD-10-CM | POA: Diagnosis present

## 2020-06-22 DIAGNOSIS — Z8673 Personal history of transient ischemic attack (TIA), and cerebral infarction without residual deficits: Secondary | ICD-10-CM

## 2020-06-22 DIAGNOSIS — I69351 Hemiplegia and hemiparesis following cerebral infarction affecting right dominant side: Secondary | ICD-10-CM | POA: Diagnosis not present

## 2020-06-22 DIAGNOSIS — I130 Hypertensive heart and chronic kidney disease with heart failure and stage 1 through stage 4 chronic kidney disease, or unspecified chronic kidney disease: Secondary | ICD-10-CM | POA: Diagnosis not present

## 2020-06-22 DIAGNOSIS — Z170 Estrogen receptor positive status [ER+]: Secondary | ICD-10-CM

## 2020-06-22 DIAGNOSIS — R197 Diarrhea, unspecified: Secondary | ICD-10-CM | POA: Diagnosis present

## 2020-06-22 DIAGNOSIS — M6281 Muscle weakness (generalized): Secondary | ICD-10-CM | POA: Diagnosis not present

## 2020-06-22 DIAGNOSIS — I447 Left bundle-branch block, unspecified: Secondary | ICD-10-CM | POA: Diagnosis present

## 2020-06-22 DIAGNOSIS — Z950 Presence of cardiac pacemaker: Secondary | ICD-10-CM | POA: Diagnosis not present

## 2020-06-22 DIAGNOSIS — N1832 Chronic kidney disease, stage 3b: Secondary | ICD-10-CM | POA: Diagnosis present

## 2020-06-22 DIAGNOSIS — I471 Supraventricular tachycardia, unspecified: Secondary | ICD-10-CM | POA: Diagnosis present

## 2020-06-22 DIAGNOSIS — J1282 Pneumonia due to coronavirus disease 2019: Secondary | ICD-10-CM | POA: Diagnosis not present

## 2020-06-22 DIAGNOSIS — C50311 Malignant neoplasm of lower-inner quadrant of right female breast: Secondary | ICD-10-CM

## 2020-06-22 DIAGNOSIS — Z853 Personal history of malignant neoplasm of breast: Secondary | ICD-10-CM | POA: Diagnosis not present

## 2020-06-22 DIAGNOSIS — N183 Chronic kidney disease, stage 3 unspecified: Secondary | ICD-10-CM | POA: Diagnosis present

## 2020-06-22 DIAGNOSIS — E876 Hypokalemia: Secondary | ICD-10-CM | POA: Diagnosis present

## 2020-06-22 DIAGNOSIS — Z86718 Personal history of other venous thrombosis and embolism: Secondary | ICD-10-CM

## 2020-06-22 DIAGNOSIS — E860 Dehydration: Secondary | ICD-10-CM | POA: Diagnosis present

## 2020-06-22 DIAGNOSIS — E1122 Type 2 diabetes mellitus with diabetic chronic kidney disease: Secondary | ICD-10-CM

## 2020-06-22 DIAGNOSIS — U071 COVID-19: Principal | ICD-10-CM | POA: Diagnosis present

## 2020-06-22 DIAGNOSIS — Z79899 Other long term (current) drug therapy: Secondary | ICD-10-CM

## 2020-06-22 DIAGNOSIS — E1169 Type 2 diabetes mellitus with other specified complication: Secondary | ICD-10-CM

## 2020-06-22 DIAGNOSIS — R278 Other lack of coordination: Secondary | ICD-10-CM | POA: Diagnosis not present

## 2020-06-22 DIAGNOSIS — I5033 Acute on chronic diastolic (congestive) heart failure: Secondary | ICD-10-CM | POA: Diagnosis not present

## 2020-06-22 DIAGNOSIS — R0602 Shortness of breath: Secondary | ICD-10-CM

## 2020-06-22 DIAGNOSIS — Z743 Need for continuous supervision: Secondary | ICD-10-CM | POA: Diagnosis not present

## 2020-06-22 DIAGNOSIS — Z741 Need for assistance with personal care: Secondary | ICD-10-CM | POA: Diagnosis not present

## 2020-06-22 DIAGNOSIS — E1165 Type 2 diabetes mellitus with hyperglycemia: Secondary | ICD-10-CM | POA: Diagnosis present

## 2020-06-22 DIAGNOSIS — R6889 Other general symptoms and signs: Secondary | ICD-10-CM | POA: Diagnosis not present

## 2020-06-22 DIAGNOSIS — D61818 Other pancytopenia: Secondary | ICD-10-CM | POA: Diagnosis not present

## 2020-06-22 DIAGNOSIS — C50919 Malignant neoplasm of unspecified site of unspecified female breast: Secondary | ICD-10-CM | POA: Diagnosis not present

## 2020-06-22 DIAGNOSIS — Z7901 Long term (current) use of anticoagulants: Secondary | ICD-10-CM | POA: Diagnosis not present

## 2020-06-22 DIAGNOSIS — Z794 Long term (current) use of insulin: Secondary | ICD-10-CM

## 2020-06-22 DIAGNOSIS — E119 Type 2 diabetes mellitus without complications: Secondary | ICD-10-CM | POA: Diagnosis not present

## 2020-06-22 DIAGNOSIS — R778 Other specified abnormalities of plasma proteins: Secondary | ICD-10-CM | POA: Diagnosis not present

## 2020-06-22 DIAGNOSIS — R52 Pain, unspecified: Secondary | ICD-10-CM | POA: Diagnosis not present

## 2020-06-22 DIAGNOSIS — R279 Unspecified lack of coordination: Secondary | ICD-10-CM | POA: Diagnosis not present

## 2020-06-22 DIAGNOSIS — I110 Hypertensive heart disease with heart failure: Secondary | ICD-10-CM | POA: Diagnosis not present

## 2020-06-22 DIAGNOSIS — I5042 Chronic combined systolic (congestive) and diastolic (congestive) heart failure: Secondary | ICD-10-CM | POA: Diagnosis present

## 2020-06-22 DIAGNOSIS — Z79811 Long term (current) use of aromatase inhibitors: Secondary | ICD-10-CM

## 2020-06-22 DIAGNOSIS — R2681 Unsteadiness on feet: Secondary | ICD-10-CM | POA: Diagnosis not present

## 2020-06-22 DIAGNOSIS — D649 Anemia, unspecified: Secondary | ICD-10-CM | POA: Diagnosis not present

## 2020-06-22 DIAGNOSIS — R1312 Dysphagia, oropharyngeal phase: Secondary | ICD-10-CM | POA: Diagnosis not present

## 2020-06-22 DIAGNOSIS — R531 Weakness: Secondary | ICD-10-CM | POA: Diagnosis not present

## 2020-06-22 LAB — CBC WITH DIFFERENTIAL/PLATELET
Abs Immature Granulocytes: 0.01 10*3/uL (ref 0.00–0.07)
Basophils Absolute: 0 10*3/uL (ref 0.0–0.1)
Basophils Relative: 0 %
Eosinophils Absolute: 0 10*3/uL (ref 0.0–0.5)
Eosinophils Relative: 0 %
HCT: 32.8 % — ABNORMAL LOW (ref 36.0–46.0)
Hemoglobin: 10.5 g/dL — ABNORMAL LOW (ref 12.0–15.0)
Immature Granulocytes: 0 %
Lymphocytes Relative: 30 %
Lymphs Abs: 1 10*3/uL (ref 0.7–4.0)
MCH: 30.9 pg (ref 26.0–34.0)
MCHC: 32 g/dL (ref 30.0–36.0)
MCV: 96.5 fL (ref 80.0–100.0)
Monocytes Absolute: 0.4 10*3/uL (ref 0.1–1.0)
Monocytes Relative: 12 %
Neutro Abs: 1.8 10*3/uL (ref 1.7–7.7)
Neutrophils Relative %: 58 %
Platelets: 94 10*3/uL — ABNORMAL LOW (ref 150–400)
RBC: 3.4 MIL/uL — ABNORMAL LOW (ref 3.87–5.11)
RDW: 14.1 % (ref 11.5–15.5)
WBC: 3.2 10*3/uL — ABNORMAL LOW (ref 4.0–10.5)
nRBC: 0 % (ref 0.0–0.2)

## 2020-06-22 LAB — LACTIC ACID, PLASMA: Lactic Acid, Venous: 1.5 mmol/L (ref 0.5–1.9)

## 2020-06-22 LAB — COMPREHENSIVE METABOLIC PANEL
ALT: 55 U/L — ABNORMAL HIGH (ref 0–44)
AST: 65 U/L — ABNORMAL HIGH (ref 15–41)
Albumin: 3.4 g/dL — ABNORMAL LOW (ref 3.5–5.0)
Alkaline Phosphatase: 64 U/L (ref 38–126)
Anion gap: 11 (ref 5–15)
BUN: 33 mg/dL — ABNORMAL HIGH (ref 8–23)
CO2: 28 mmol/L (ref 22–32)
Calcium: 8.1 mg/dL — ABNORMAL LOW (ref 8.9–10.3)
Chloride: 101 mmol/L (ref 98–111)
Creatinine, Ser: 1.78 mg/dL — ABNORMAL HIGH (ref 0.44–1.00)
GFR calc Af Amer: 30 mL/min — ABNORMAL LOW (ref >60)
GFR calc non Af Amer: 26 mL/min — ABNORMAL LOW (ref >60)
Glucose, Bld: 171 mg/dL — ABNORMAL HIGH (ref 70–99)
Potassium: 3.4 mmol/L — ABNORMAL LOW (ref 3.5–5.1)
Sodium: 140 mmol/L (ref 135–145)
Total Bilirubin: 0.4 mg/dL (ref 0.3–1.2)
Total Protein: 7.2 g/dL (ref 6.5–8.1)

## 2020-06-22 LAB — TROPONIN I (HIGH SENSITIVITY)
Troponin I (High Sensitivity): 85 ng/L — ABNORMAL HIGH (ref <18)
Troponin I (High Sensitivity): 70 ng/L — ABNORMAL HIGH (ref <18)

## 2020-06-22 LAB — LIPASE, BLOOD: Lipase: 22 U/L (ref 11–51)

## 2020-06-22 MED ORDER — ACETAMINOPHEN 650 MG RE SUPP: 650.0000 mg | Freq: Four times a day (QID) | RECTAL | Status: DC | PRN

## 2020-06-22 MED ORDER — DICLOFENAC SODIUM 1 % TD GEL
2.0000 g | Freq: Four times a day (QID) | TRANSDERMAL | Status: DC | PRN
  Administered 2020-06-28: 2 g via TOPICAL
  Filled 2020-06-22: qty 100

## 2020-06-22 MED ORDER — FERROUS SULFATE 325 (65 FE) MG PO TABS
325.0000 mg | ORAL_TABLET | Freq: Every day | ORAL | Status: DC
  Administered 2020-06-23 – 2020-07-01 (×9): 325 mg via ORAL
  Filled 2020-06-22 (×9): qty 1

## 2020-06-22 MED ORDER — FAMOTIDINE 20 MG PO TABS
40.0000 mg | ORAL_TABLET | Freq: Two times a day (BID) | ORAL | Status: DC
  Administered 2020-06-23: 40 mg via ORAL
  Filled 2020-06-22: qty 2

## 2020-06-22 MED ORDER — SODIUM CHLORIDE 0.9% FLUSH
3.0000 mL | Freq: Two times a day (BID) | INTRAVENOUS | Status: DC
  Administered 2020-06-23 – 2020-06-30 (×11): 3 mL via INTRAVENOUS

## 2020-06-22 MED ORDER — CARVEDILOL 12.5 MG PO TABS
12.5000 mg | ORAL_TABLET | Freq: Two times a day (BID) | ORAL | Status: DC
  Administered 2020-06-23 – 2020-07-01 (×17): 12.5 mg via ORAL
  Filled 2020-06-22 (×17): qty 1

## 2020-06-22 MED ORDER — AMIODARONE HCL 200 MG PO TABS
200.0000 mg | ORAL_TABLET | Freq: Two times a day (BID) | ORAL | Status: DC
  Administered 2020-06-23 – 2020-06-30 (×14): 200 mg via ORAL
  Filled 2020-06-22 (×16): qty 1

## 2020-06-22 MED ORDER — INSULIN DETEMIR 100 UNIT/ML ~~LOC~~ SOLN
30.0000 [IU] | Freq: Every day | SUBCUTANEOUS | Status: DC
  Administered 2020-06-23: 30 [IU] via SUBCUTANEOUS
  Filled 2020-06-22 (×2): qty 0.3

## 2020-06-22 MED ORDER — ISOSORB DINITRATE-HYDRALAZINE 20-37.5 MG PO TABS
1.0000 | ORAL_TABLET | Freq: Three times a day (TID) | ORAL | Status: DC
  Administered 2020-06-23 – 2020-07-01 (×25): 1 via ORAL
  Filled 2020-06-22 (×28): qty 1

## 2020-06-22 MED ORDER — TORSEMIDE 20 MG PO TABS
20.0000 mg | ORAL_TABLET | Freq: Two times a day (BID) | ORAL | Status: DC
  Administered 2020-06-23: 20 mg via ORAL
  Filled 2020-06-22 (×2): qty 1

## 2020-06-22 MED ORDER — SODIUM CHLORIDE 0.9 % IV BOLUS
500.0000 mL | Freq: Once | INTRAVENOUS | Status: AC
  Administered 2020-06-22: 500 mL via INTRAVENOUS

## 2020-06-22 MED ORDER — OXYBUTYNIN CHLORIDE 5 MG PO TABS
5.0000 mg | ORAL_TABLET | Freq: Two times a day (BID) | ORAL | Status: DC
  Administered 2020-06-23 – 2020-07-01 (×17): 5 mg via ORAL
  Filled 2020-06-22 (×17): qty 1

## 2020-06-22 MED ORDER — VITAMIN D3 25 MCG (1000 UNIT) PO TABS
1000.0000 [IU] | ORAL_TABLET | Freq: Every day | ORAL | Status: DC
  Administered 2020-06-23 – 2020-07-01 (×9): 1000 [IU] via ORAL
  Filled 2020-06-22 (×18): qty 1

## 2020-06-22 MED ORDER — MIRABEGRON ER 25 MG PO TB24
50.0000 mg | ORAL_TABLET | Freq: Every day | ORAL | Status: DC
  Administered 2020-06-23 – 2020-07-01 (×9): 50 mg via ORAL
  Filled 2020-06-22 (×9): qty 2

## 2020-06-22 MED ORDER — ONDANSETRON HCL 4 MG PO TABS
4.0000 mg | ORAL_TABLET | Freq: Four times a day (QID) | ORAL | Status: DC | PRN
  Filled 2020-06-22: qty 1

## 2020-06-22 MED ORDER — ATORVASTATIN CALCIUM 20 MG PO TABS
20.0000 mg | ORAL_TABLET | Freq: Every day | ORAL | Status: DC
  Administered 2020-06-23 – 2020-06-30 (×8): 20 mg via ORAL
  Filled 2020-06-22 (×8): qty 1

## 2020-06-22 MED ORDER — APIXABAN 2.5 MG PO TABS
2.5000 mg | ORAL_TABLET | Freq: Two times a day (BID) | ORAL | Status: DC
  Administered 2020-06-23 – 2020-07-01 (×17): 2.5 mg via ORAL
  Filled 2020-06-22 (×19): qty 1

## 2020-06-22 MED ORDER — ONDANSETRON HCL 4 MG/2ML IJ SOLN
4.0000 mg | Freq: Four times a day (QID) | INTRAMUSCULAR | Status: DC | PRN
  Administered 2020-06-25 – 2020-06-26 (×2): 4 mg via INTRAVENOUS
  Filled 2020-06-22 (×2): qty 2

## 2020-06-22 MED ORDER — PANTOPRAZOLE SODIUM 40 MG PO TBEC
40.0000 mg | DELAYED_RELEASE_TABLET | Freq: Every day | ORAL | Status: DC
  Administered 2020-06-23 – 2020-07-01 (×9): 40 mg via ORAL
  Filled 2020-06-22 (×9): qty 1

## 2020-06-22 MED ORDER — MIRTAZAPINE 15 MG PO TABS
7.5000 mg | ORAL_TABLET | Freq: Every day | ORAL | Status: DC
  Administered 2020-06-23 – 2020-06-30 (×8): 7.5 mg via ORAL
  Filled 2020-06-22 (×8): qty 1

## 2020-06-22 MED ORDER — METOPROLOL SUCCINATE ER 50 MG PO TB24
50.0000 mg | ORAL_TABLET | Freq: Every day | ORAL | Status: DC
  Administered 2020-06-23 – 2020-07-01 (×9): 50 mg via ORAL
  Filled 2020-06-22 (×9): qty 1

## 2020-06-22 MED ORDER — POTASSIUM CHLORIDE CRYS ER 20 MEQ PO TBCR
40.0000 meq | EXTENDED_RELEASE_TABLET | Freq: Every day | ORAL | Status: DC
  Administered 2020-06-23 – 2020-06-27 (×5): 40 meq via ORAL
  Filled 2020-06-22 (×5): qty 2

## 2020-06-22 MED ORDER — PREGABALIN 75 MG PO CAPS
75.0000 mg | ORAL_CAPSULE | Freq: Every day | ORAL | Status: DC
  Administered 2020-06-23 – 2020-06-30 (×8): 75 mg via ORAL
  Filled 2020-06-22 (×8): qty 1

## 2020-06-22 MED ORDER — INSULIN ASPART 100 UNIT/ML ~~LOC~~ SOLN
0.0000 [IU] | Freq: Every day | SUBCUTANEOUS | Status: DC
  Administered 2020-06-27: 5 [IU] via SUBCUTANEOUS
  Administered 2020-06-28: 2 [IU] via SUBCUTANEOUS
  Administered 2020-06-29: 4 [IU] via SUBCUTANEOUS
  Administered 2020-06-30: 3 [IU] via SUBCUTANEOUS
  Filled 2020-06-22: qty 0.05

## 2020-06-22 MED ORDER — ACETAMINOPHEN 500 MG PO TABS: 1000.0000 mg | ORAL_TABLET | Freq: Four times a day (QID) | ORAL | Status: DC | PRN

## 2020-06-22 MED ORDER — INSULIN ASPART 100 UNIT/ML ~~LOC~~ SOLN
0.0000 [IU] | Freq: Three times a day (TID) | SUBCUTANEOUS | Status: DC
  Administered 2020-06-23: 8 [IU] via SUBCUTANEOUS
  Administered 2020-06-23: 2 [IU] via SUBCUTANEOUS
  Administered 2020-06-23: 3 [IU] via SUBCUTANEOUS
  Administered 2020-06-24 – 2020-06-25 (×3): 2 [IU] via SUBCUTANEOUS
  Administered 2020-06-26 (×2): 11 [IU] via SUBCUTANEOUS
  Administered 2020-06-26 – 2020-06-27 (×2): 3 [IU] via SUBCUTANEOUS
  Administered 2020-06-27: 5 [IU] via SUBCUTANEOUS
  Administered 2020-06-27: 8 [IU] via SUBCUTANEOUS
  Administered 2020-06-28: 11 [IU] via SUBCUTANEOUS
  Administered 2020-06-28: 5 [IU] via SUBCUTANEOUS
  Administered 2020-06-28: 8 [IU] via SUBCUTANEOUS
  Administered 2020-06-29: 5 [IU] via SUBCUTANEOUS
  Administered 2020-06-29: 11 [IU] via SUBCUTANEOUS
  Administered 2020-06-30: 8 [IU] via SUBCUTANEOUS
  Administered 2020-06-30: 5 [IU] via SUBCUTANEOUS
  Administered 2020-06-30: 8 [IU] via SUBCUTANEOUS
  Administered 2020-07-01 (×2): 3 [IU] via SUBCUTANEOUS
  Filled 2020-06-22: qty 0.15

## 2020-06-22 MED ORDER — ACETAMINOPHEN 325 MG PO TABS
650.0000 mg | ORAL_TABLET | Freq: Four times a day (QID) | ORAL | Status: DC | PRN
  Administered 2020-06-24 – 2020-06-30 (×7): 650 mg via ORAL
  Filled 2020-06-22 (×7): qty 2

## 2020-06-22 MED ORDER — SODIUM CHLORIDE 0.45 % IV SOLN: INTRAVENOUS | Status: DC

## 2020-06-22 MED ORDER — TRAMADOL HCL 50 MG PO TABS
50.0000 mg | ORAL_TABLET | Freq: Two times a day (BID) | ORAL | Status: DC | PRN
  Administered 2020-06-25 – 2020-06-28 (×2): 50 mg via ORAL
  Filled 2020-06-22 (×3): qty 1

## 2020-06-22 MED ORDER — CYANOCOBALAMIN 500 MCG PO TABS
250.0000 ug | ORAL_TABLET | Freq: Every day | ORAL | Status: DC
  Administered 2020-06-23 – 2020-07-01 (×9): 250 ug via ORAL
  Filled 2020-06-22 (×9): qty 1

## 2020-06-22 MED ORDER — ANASTROZOLE 1 MG PO TABS
1.0000 mg | ORAL_TABLET | Freq: Every day | ORAL | Status: DC
  Administered 2020-06-23 – 2020-07-01 (×9): 1 mg via ORAL
  Filled 2020-06-22 (×9): qty 1

## 2020-06-22 NOTE — ED Notes (Signed)
Hospitalist at bedside 

## 2020-06-22 NOTE — ED Notes (Signed)
Patient expressed need to void. Placed on purewick.

## 2020-06-22 NOTE — ED Triage Notes (Signed)
Per EMS, pt from home, covid + x3 days ago. Had scheduled antibody infusion yesterday and complains of diarrhea since that time. Daughter reports syncopal episode this morning while ambulating.   20 G left hand 100 mL NS

## 2020-06-22 NOTE — ED Provider Notes (Signed)
[facility] DEPT Provider Note   CSN: [id number] Arrival date & time: 06/22/20  1504     History Chief Complaint  Patient presents with  . Covid +  . Diarrhea    [name] is a 86 y.o. female.  She was diagnosed with Covid 3 days ago after having fevers chills.  She felt like she was doing pretty well without and did not need to be admitted.  She got her antibody infusion yesterday and has had diarrhea starting this morning multiple episodes.  Reportedly was in the kitchen in her wheelchair when she passed out.  She denies any injury.  She said she feels kind of weak.  Denies being short of breath.  No known recent fevers.  Lives at home with family.  The history is provided by the patient.  Diarrhea Quality:  Unable to specify Severity:  Moderate Onset quality:  Sudden Timing:  Intermittent Progression:  Unchanged Relieved by:  None tried Worsened by:  Nothing Ineffective treatments:  None tried Associated symptoms: myalgias   Associated symptoms: no abdominal pain, no chills, no recent cough, no fever and no headaches        Past Medical History:  Diagnosis Date  . Abnormal echocardiogram    a. possible mass on echo 05/2019 on PPM, b. no evidence of mass / atrial lead vegetation on follow up echo 06/2019  . Anemia   . Arthritis   . Breast cancer ([place]) 10/2018   Invasive ductal carcinoma  . Cancer ([place])   . Cataract   . Chronic combined systolic and diastolic CHF (congestive heart failure) ([place])    a. Previously diastolic but patient AVS from outside hospital listed systolic CHF and cardiomyopathy, records pending.  . CKD (chronic kidney disease), stage III   . Clotting disorder ([place])   . CVA (cerebral vascular accident) ([place])    residual right sided weakness and mild dysphagia  . Diabetes mellitus ([place])   . Dizziness and giddiness   . DVT (deep venous thrombosis) (H[facility])    a. on anticoagulation for this.  . Essential hypertension    . LBBB (left bundle branch block)   . Mitral regurgitation    a. Mod MR by echo 2014.  . Muscular deconditioning   . Obesity   . Pacemaker   . Pericardial effusion   . SVT (supraventricular tachycardia) ([place])    a. In 2013 she had an EPS with ablation for SVT which did not eliminate the SVT completely. She also had bradycardia which limited medication. She was placed on amiodarone by Dr. Lovena Le.  . Thrombocytopenia ([place]) 11/21/2011    Patient Active Problem List   Diagnosis Date Noted  . Pacemaker 05/06/2020  . Syncope 08/10/2019  . Displaced fracture of lateral malleolus of right fibula, initial encounter for closed fracture 07/10/2019  . Pain in left shoulder 07/10/2019  . Acute CHF (congestive heart failure) ([place]) 06/09/2019  . Acute pulmonary edema (H[facility])   . Personal history of breast cancer 04/22/2019  . Pressure ulcer 01/04/2019  . Acute pancreatitis 01/02/2019  . Malignant neoplasm of lower-inner quadrant of right breast of female, estrogen receptor positive ([place]) 11/23/2018  . Hematoma of right iliopsoas muscle 11/06/2018  . Intramuscular hematoma 11/06/2018  . Hypertensive heart disease with heart failure ([place]) 09/05/2017  . Aphasia 08/31/2017  . Dysphagia 08/31/2017  . CKD (chronic kidney disease), stage III ([place])   . LBBB (left bundle branch block)   . Anemia   . HTN (  hypertension)   . Obesity   . DVT (deep venous thrombosis) ([place])   . DM (diabetes mellitus) ([place]) 07/28/2015  . Fall 07/28/2015  . Warfarin-induced coagulopathy ([place]) 07/28/2015  . Acute on chronic diastolic CHF (congestive heart failure) ([place]) 03/19/2014  . Edema 08/06/2013  . SVT (supraventricular tachycardia) ([place]) 10/15/2012  . First degree AV block 10/15/2012  . Acute renal insufficiency 10/14/2012  . Dizziness and giddiness   . Thrombocytopenia ([place]) 11/21/2011    Past Surgical History:  Procedure Laterality Date  . EYE SURGERY    . SUPRAVENTRICULAR TACHYCARDIA ABLATION N/A 10/11/2012    Procedure: SUPRAVENTRICULAR TACHYCARDIA ABLATION;  Surgeon: Evans Lance, MD;  Location: [facility] CATH LAB;  Service: Cardiovascular;  Laterality: N/A;     OB History   No obstetric history on file.     Family History  Problem Relation Age of Onset  . Stroke Mother   . Cancer Father        prostate  . Hypertension Daughter   . Heart attack Brother        x2  . Hypertension Brother   . Stroke Sister   . Hypertension Sister   . Breast cancer Maternal Aunt     Social History   Tobacco Use  . Smoking status: Never Smoker  . Smokeless tobacco: Never Used  Vaping Use  . Vaping Use: Never used  Substance Use Topics  . Alcohol use: No  . Drug use: No    Home Medications Prior to Admission medications   Medication Sig Start Date End Date Taking? Authorizing Provider  ACCU-CHEK AVIVA PLUS test strip USE AS DIRECTED TO CHECK BLOOD SUGAR FOUR TIMES DAILY 12/11/19   [name], MD  Accu-Chek Softclix Lancets lancets Use as instructed to check blood sugars 4 times per day dx: e11.65 04/06/20   [name], MD  acetaminophen (TYLENOL) 500 MG tablet Take 1,000 mg by mouth every 6 (six) hours as needed for mild pain, moderate pain or headache.     [provider]  amiodarone (PACERONE) 200 MG tablet Take one tablet by mouth daily Monday-Saturday. Do NOT take Sunday 05/06/20   Evans Lance, MD  anastrozole (ARIMIDEX) 1 MG tablet TAKE 1 TABLET(1 MG) BY MOUTH DAILY 01/15/20   Nicholas Lose, MD  apixaban (ELIQUIS) 2.5 MG TABS tablet Take 1 tablet (2.5 mg total) by mouth 2 (two) times daily. 09/12/19   Evans Lance, MD  atorvastatin (LIPITOR) 20 MG tablet Take 1 tablet (20 mg total) by mouth at bedtime. 06/14/19   [name], MD  BD PEN NEEDLE NANO 2ND GEN 32G X 4 MM MISC USE AS DIRECTED 04/17/20   [name], MD  Blood Glucose Monitoring Suppl (ACCU-CHEK AVIVA PLUS) w/Device KIT USE AS DIRECTED TO CHECK BLOOD SUGAR FOUR TIMES DAILY dx: e11.65 04/06/20   [name],  MD  calcium carbonate (OS-CAL) 600 MG TABS tablet Take 600 mg by mouth daily with breakfast.     [provider]  carvedilol (COREG) 12.5 MG tablet TAKE 1 TABLET(12.5 MG) BY MOUTH TWICE DAILY 10/08/19   Lyda Jester M, PA-C  Cholecalciferol (VITAMIN D-3 PO) Take 1 capsule by mouth daily after breakfast.    [provider]  Cyanocobalamin (B-12 PO) Take 1 tablet by mouth daily after breakfast.     [provider]  diclofenac sodium (VOLTAREN) 1 % GEL Apply 2 g topically 4 (four) times daily. Patient taking differently: Apply 2 g topically See admin instructions. Apply 2 grams to  knees at bedtime and an additional 2 grams three times a day as needed for pain 10/30/18   Rodriguez-Southworth, Sunday Spillers, PA-C  ferrous sulfate 325 (65 FE) MG tablet Take 325 mg by mouth daily with breakfast.    [provider]  HUMALOG KWIKPEN 200 UNIT/ML KwikPen INJECT 3 UNITS UNDER THE SKIN BEFORE BREAKFAST, LUNCH, DINNER IF SUGARS ARE GREATER THAN 175. 02/11/20   [name], MD  insulin detemir (LEVEMIR) 100 UNIT/ML injection Take 16 units in the morning and 10 units in the evening. 09/17/19   Minette Brine, FNP  isosorbide-hydrALAZINE (BIDIL) 20-37.5 MG tablet Take 1 tablet by mouth 3 (three) times daily. 02/28/20   Jettie Booze, MD  LEVEMIR FLEXTOUCH 100 UNIT/ML FlexPen INJECT 30 UNITS UNDER THE SKIN EVERY NIGHT AT BEDTIME AS DIRECTED PER SLIDING SCALE PROTOCOL 02/11/20   [name], MD  magnesium oxide (MAG-OX) 400 MG tablet Take 400 mg by mouth daily.     [provider]  metoprolol succinate (TOPROL-XL) 50 MG 24 hr tablet TAKE 1 TABLET BY MOUTH EVERY DAY 12/11/19   Minette Brine, FNP  mirtazapine (REMERON) 7.5 MG tablet TAKE 1 TABLET(7.5 MG) BY MOUTH AT BEDTIME 04/14/20   [name], MD  omeprazole (PRILOSEC) 40 MG capsule TAKE 1 CAPSULE BY MOUTH DAILY BEFORE A MEAL 12/11/19   [name], MD  oxybutynin (DITROPAN) 5 MG tablet TAKE 1 TABLET(5 MG) BY MOUTH  TWICE DAILY 12/23/19   [name], MD  potassium chloride SA (KLOR-CON) 20 MEQ tablet TAKE 2 TABLETS(40 MEQ) BY MOUTH DAILY 06/17/20   [name], MD  pregabalin (LYRICA) 75 MG capsule TAKE ONE CAPSULE BY MOUTH AT BEDTIME 04/06/20   [name], MD  Semaglutide,0.25 or 0.5MG/DOS, (OZEMPIC, 0.25 OR 0.5 MG/DOSE,) 2 MG/1.5ML SOPN Inject 0.25 mg into the skin.    [provider]  torsemide (DEMADEX) 20 MG tablet Take 1 tablet (20 mg total) by mouth 2 (two) times daily. You may take an extra 20 mg tablet daily as needed for swelling 01/14/20   Jettie Booze, MD  traMADol (ULTRAM) 50 MG tablet Take 1 tablet (50 mg total) by mouth every 12 (twelve) hours as needed. 05/19/20   [name], MD    Allergies    Aspirin and Penicillins  Review of Systems   Review of Systems  Constitutional: Negative for chills and fever.  HENT: Negative for sore throat.   Eyes: Negative for visual disturbance.  Respiratory: Negative for shortness of breath.   Cardiovascular: Positive for chest pain.  Gastrointestinal: Positive for diarrhea. Negative for abdominal pain.  Genitourinary: Negative for dysuria.  Musculoskeletal: Positive for gait problem and myalgias.  Skin: Negative for rash.  Neurological: Negative for headaches.    Physical Exam Updated Vital Signs BP 127/65 (BP Location: Left Arm)   Pulse 62   Temp 98.3 F (36.8 C) (Oral)   Resp 19   SpO2 100%   Physical Exam Vitals and nursing note reviewed.  Constitutional:      General: She is not in acute distress.    Appearance: She is well-developed.  HENT:     Head: Normocephalic and atraumatic.  Eyes:     Conjunctiva/sclera: Conjunctivae normal.  Cardiovascular:     Rate and Rhythm: Normal rate and regular rhythm.     Pulses: Normal pulses.     Heart sounds: No murmur heard.   Pulmonary:     Effort: Pulmonary effort is normal. No respiratory distress.     Breath sounds: Normal breath  sounds.  Abdominal:      Palpations: Abdomen is soft.     Tenderness: There is no abdominal tenderness. There is no guarding or rebound.  Musculoskeletal:        General: No deformity or signs of injury.     Cervical back: Neck supple.  Skin:    General: Skin is warm and dry.     Capillary Refill: Capillary refill takes less than 2 seconds.  Neurological:     Mental Status: She is alert. Mental status is at baseline.     Comments: Patient is awake and alert.  She has had prior strokes and is not ambulatory at baseline due to general deconditioning.  Psychiatric:        Behavior: Behavior is cooperative.     ED Results / Procedures / Treatments   Labs (all labs ordered are listed, but only abnormal results are displayed) Labs Reviewed  COMPREHENSIVE METABOLIC PANEL - Abnormal; Notable for the following components:      Result Value   Potassium 3.4 (*)    Glucose, Bld 171 (*)    BUN 33 (*)    Creatinine, Ser 1.78 (*)    Calcium 8.1 (*)    Albumin 3.4 (*)    AST 65 (*)    ALT 55 (*)    GFR calc non Af Amer 26 (*)    GFR calc Af Amer 30 (*)    All other components within normal limits  CBC WITH DIFFERENTIAL/PLATELET - Abnormal; Notable for the following components:   WBC 3.2 (*)    RBC 3.40 (*)    Hemoglobin 10.5 (*)    HCT 32.8 (*)    Platelets 94 (*)    All other components within normal limits  COMPREHENSIVE METABOLIC PANEL - Abnormal; Notable for the following components:   Potassium 2.9 (*)    Glucose, Bld 139 (*)    BUN 33 (*)    Creatinine, Ser 1.65 (*)    Calcium 7.9 (*)    Albumin 3.1 (*)    AST 55 (*)    ALT 47 (*)    GFR calc non Af Amer 28 (*)    GFR calc Af Amer 32 (*)    All other components within normal limits  CBC - Abnormal; Notable for the following components:   WBC 2.4 (*)    RBC 3.42 (*)    Hemoglobin 10.4 (*)    HCT 32.8 (*)    Platelets 89 (*)    All other components within normal limits  HEMOGLOBIN A1C - Abnormal; Notable for the following components:   Hgb A1c  MFr Bld 9.1 (*)    All other components within normal limits  CBG MONITORING, ED - Abnormal; Notable for the following components:   Glucose-Capillary 135 (*)    All other components within normal limits  CBG MONITORING, ED - Abnormal; Notable for the following components:   Glucose-Capillary 148 (*)    All other components within normal limits  TROPONIN I (HIGH SENSITIVITY) - Abnormal; Notable for the following components:   Troponin I (High Sensitivity) 85 (*)    All other components within normal limits  TROPONIN I (HIGH SENSITIVITY) - Abnormal; Notable for the following components:   Troponin I (High Sensitivity) 70 (*)    All other components within normal limits  LACTIC ACID, PLASMA  LIPASE, BLOOD  URINALYSIS, ROUTINE W REFLEX MICROSCOPIC    EKG EKG Interpretation  Date/Time:  Monday June 22 2020 16:18:03 EDT  Ventricular Rate:  63 PR Interval:    QRS Duration: 168 QT Interval:  500 QTC Calculation: 512 R Axis:   -74 Text Interpretation: Sinus rhythm Left bundle branch block No significant change since prior 10/20 Confirmed by Aletta Edouard [phone]) on 06/22/2020 4:27:54 PM   Radiology DG Chest Port 1 View  Result Date: 06/22/2020 CLINICAL DATA:  COVID positive, weakness EXAM: PORTABLE CHEST 1 VIEW COMPARISON:  06/20/2020 FINDINGS: Chronic mild interstitial prominence. No new consolidation. No pleural effusion. Stable cardiomediastinal contours. Left chest wall pacemaker. IMPRESSION: No significant change since 06/20/2020. Electronically Signed   By: Macy Mis M.D.   On: 06/22/2020 16:29    Procedures Procedures (including critical care time)  Medications Ordered in ED Medications  sodium chloride flush (NS) 0.9 % injection 3 mL (3 mLs Intravenous Given 06/23/20 0951)  acetaminophen (TYLENOL) tablet 1,000 mg (has no administration in time range)  diclofenac sodium (VOLTAREN) 1 % transdermal gel 2 g (has no administration in time range)  ferrous sulfate tablet  325 mg (325 mg Oral Given 06/23/20 0936)  atorvastatin (LIPITOR) tablet 20 mg (0 mg Oral Hold 06/23/20 0532)  cholecalciferol (VITAMIN D) tablet 1,000 Units (1,000 Units Oral Given 06/23/20 0937)  vitamin B-12 (CYANOCOBALAMIN) tablet 250 mcg (250 mcg Oral Given 06/23/20 0937)  apixaban (ELIQUIS) tablet 2.5 mg (2.5 mg Oral Given 06/23/20 0935)  carvedilol (COREG) tablet 12.5 mg (12.5 mg Oral Given 06/23/20 0938)  pantoprazole (PROTONIX) EC tablet 40 mg (40 mg Oral Given 06/23/20 0939)  metoprolol succinate (TOPROL-XL) 24 hr tablet 50 mg (50 mg Oral Given 06/23/20 0937)  oxybutynin (DITROPAN) tablet 5 mg (5 mg Oral Given 06/23/20 0936)  torsemide (DEMADEX) tablet 20 mg (20 mg Oral Given 06/23/20 0938)  anastrozole (ARIMIDEX) tablet 1 mg (1 mg Oral Given 06/23/20 0939)  insulin detemir (LEVEMIR) injection 30 Units (has no administration in time range)  isosorbide-hydrALAZINE (BIDIL) 20-37.5 MG per tablet 1 tablet (1 tablet Oral Given 06/23/20 0937)  pregabalin (LYRICA) capsule 75 mg (0 mg Oral Hold 06/23/20 0533)  mirtazapine (REMERON) tablet 7.5 mg (0 mg Oral Hold 06/23/20 0533)  amiodarone (PACERONE) tablet 200 mg (200 mg Oral Given 06/23/20 0936)  traMADol (ULTRAM) tablet 50 mg (has no administration in time range)  potassium chloride SA (KLOR-CON) CR tablet 40 mEq (40 mEq Oral Given 06/23/20 0935)  famotidine (PEPCID) tablet 40 mg (40 mg Oral Given 06/23/20 0936)  mirabegron ER (MYRBETRIQ) tablet 50 mg (50 mg Oral Given 06/23/20 0935)  0.45 % sodium chloride infusion ( Intravenous Rate/Dose Verify 06/23/20 0555)  ondansetron (ZOFRAN) tablet 4 mg (has no administration in time range)    Or  ondansetron (ZOFRAN) injection 4 mg (has no administration in time range)  acetaminophen (TYLENOL) tablet 650 mg (has no administration in time range)    Or  acetaminophen (TYLENOL) suppository 650 mg (has no administration in time range)  insulin aspart (novoLOG) injection 0-15 Units (2 Units Subcutaneous Given 06/23/20  0939)  insulin aspart (novoLOG) injection 0-5 Units (0 Units Subcutaneous Hold 06/23/20 0534)  sodium chloride 0.9 % bolus 500 mL (0 mLs Intravenous Stopped 06/22/20 1824)    ED Course  I have reviewed the triage vital signs and the nursing notes.  Pertinent labs & imaging results that were available during my care of the patient were reviewed by me and considered in my medical decision making (see chart for details).  Clinical Course as of Jun 23 1016  Mon Jun 22, 2020  1631 Chest x-ray interpreted by  me as no gross infiltrates.   [MB]  9798 Discussed with Dr. Jonelle Sidle [facility] hospitalist to evaluate patient for admission.   [MB]    Clinical Course User Index [MB] Hayden Rasmussen, MD   MDM Rules/Calculators/A&P                         This patient complains of diarrhea, syncope, recent Covid diagnosis; this involves an extensive number of treatment Options and is a complaint that carries with it a high risk of complications and Morbidity. The differential includes arrhythmia, bleed, metabolic derangement, dehydration  I ordered, reviewed and interpreted labs, which included CBC with pancytopenia seen on prior, chemistries with mildly low potassium, elevated creatinine stable from priors, mild elevation of LFTs consistent with her Covid infection, elevated troponin will need to be trended I ordered medication IV fluids I ordered imaging studies which included chest x-ray and I independently    visualized and interpreted imaging which showed no acute infiltrates Previous records obtained and reviewed in epic including prior ED visit few days ago where she was diagnosed with Covid I consulted [facility] hospitalist Dr. Jonelle Sidle and discussed lab and imaging findings  Critical Interventions: None  After the interventions stated above, I reevaluated the patient and found patient to be fairly asymptomatic from a respiratory standpoint.  Due to her syncope diarrhea and ongoing Covid infection think  she would benefit from admission to the hospital for cardiac monitoring hydration and further trending of her troponins   Final Clinical Impression(s) / ED Diagnoses Final diagnoses:  COVID-19 virus infection  Syncope and collapse  Pancytopenia (HCC)  Elevated troponin    Rx / DC Orders ED Discharge Orders    None       Hayden Rasmussen, MD 06/23/20 1021

## 2020-06-22 NOTE — H&P (Signed)
History and Physical   [name] ZOX:[id number] DOB: 1933-09-12 DOA: 06/22/2020  Referring MD/NP/PA: Dr. Melina Copa  PCP: [name], MD   Outpatient Specialists: None  Patient coming from: Home  Chief Complaint: Passing out  HPI: [name] is a 86 y.o. female with medical history significant of breast cancer, chronic combined systolic and diastolic CHF, chronic kidney disease stage III, history of CVA, history of left bundle branch block as well as mitral regurgitation, essential hypertension diabetes who was diagnosed with COVID-19 infection 3 days ago.  At the time she only had fever and chills but no respiratory distress.  Patient also was sent to get antibody infusion yesterday.  She got it and was doing fine but when she got home she started having diarrhea.  She had multiple episodes of diarrhea.  She was in her kitchen this afternoon when she felt weak drowsy and passed out.  No injuries.  Did not hit her head.  She was brought to the ER where she is now being admitted for evaluation of syncope.  She appears to be orthostatic.  Patient currently having a headache and laying down in bed.  Patient suspected to have had syncope secondary to dehydration as well as her newly diagnosed COVID-19 infection..  ED Course: Temperature 98.3 blood pressure 149/94 pulse 190 respirate 24 oxygen sats 95% room air.  Sodium is 140 potassium 3.4 chloride 101 glucose 171.  Creatinine 1.78.  Lactic acid is 1.5 troponin 85.  White count 3.2 hemoglobin 10.5.  Platelets 94.  Urinalysis essentially negative.  EKG showed sinus rhythm with left bundle branch block.  No significant change.  Her COVID-19 was +3 days ago.  Chest x-ray showed no active infiltrates.  Patient being admitted for syncopal work-up  Review of Systems: As per HPI otherwise 10 point review of systems negative.    Past Medical History:  Diagnosis Date  . Abnormal echocardiogram    a. possible mass on echo 05/2019 on PPM, b. no evidence  of mass / atrial lead vegetation on follow up echo 06/2019  . Anemia   . Arthritis   . Breast cancer ([place]) 10/2018   Invasive ductal carcinoma  . Cancer ([place])   . Cataract   . Chronic combined systolic and diastolic CHF (congestive heart failure) ([place])    a. Previously diastolic but patient AVS from outside hospital listed systolic CHF and cardiomyopathy, records pending.  . CKD (chronic kidney disease), stage III   . Clotting disorder ([place])   . CVA (cerebral vascular accident) ([place])    residual right sided weakness and mild dysphagia  . Diabetes mellitus ([place])   . Dizziness and giddiness   . DVT (deep venous thrombosis) ([facility])    a. on anticoagulation for this.  . Essential hypertension   . LBBB (left bundle branch block)   . Mitral regurgitation    a. Mod MR by echo 2014.  . Muscular deconditioning   . Obesity   . Pacemaker   . Pericardial effusion   . SVT (supraventricular tachycardia) ([place])    a. In 2013 she had an EPS with ablation for SVT which did not eliminate the SVT completely. She also had bradycardia which limited medication. She was placed on amiodarone by Dr. Lovena Le.  . Thrombocytopenia ([place]) 11/21/2011    Past Surgical History:  Procedure Laterality Date  . EYE SURGERY    . SUPRAVENTRICULAR TACHYCARDIA ABLATION N/A 10/11/2012   Procedure: SUPRAVENTRICULAR TACHYCARDIA ABLATION;  Surgeon: Evans Lance, MD;  Location: [facility];  Service: Cardiovascular;  Laterality: N/A;     reports that she has never smoked. She has never used smokeless tobacco. She reports that she does not drink alcohol and does not use drugs.  Allergies  Allergen Reactions  . Aspirin Itching  . Penicillins Itching and Rash    DID THE REACTION INVOLVE: Swelling of the face/tongue/throat, SOB, or low BP? Yes Sudden or severe rash/hives, skin peeling, or the inside of the mouth or nose? No Did it require medical treatment? Yes When did it last happen?"More than 10 years ago" If all above  answers are "NO", may proceed with cephalosporin use.     Family History  Problem Relation Age of Onset  . Stroke Mother   . Cancer Father        prostate  . Hypertension Daughter   . Heart attack Brother        x2  . Hypertension Brother   . Stroke Sister   . Hypertension Sister   . Breast cancer Maternal Aunt      Prior to Admission medications   Medication Sig Start Date End Date Taking? Authorizing Provider  acetaminophen (TYLENOL) 500 MG tablet Take 1,000 mg by mouth every 6 (six) hours as needed for mild pain, moderate pain or headache.    Yes [provider]  amiodarone (PACERONE) 200 MG tablet Take one tablet by mouth daily Monday-Saturday. Do NOT take Sunday 05/06/20  Yes Evans Lance, MD  anastrozole (ARIMIDEX) 1 MG tablet TAKE 1 TABLET(1 MG) BY MOUTH DAILY Patient taking differently: Take 1 mg by mouth daily.  01/15/20  Yes Nicholas Lose, MD  apixaban (ELIQUIS) 2.5 MG TABS tablet Take 1 tablet (2.5 mg total) by mouth 2 (two) times daily. 09/12/19  Yes Evans Lance, MD  atorvastatin (LIPITOR) 20 MG tablet Take 1 tablet (20 mg total) by mouth at bedtime. 06/14/19  Yes [name], MD  carvedilol (COREG) 12.5 MG tablet TAKE 1 TABLET(12.5 MG) BY MOUTH TWICE DAILY Patient taking differently: Take 12.5 mg by mouth 2 (two) times daily with a meal.  10/08/19  Yes Lyda Jester M, PA-C  Cholecalciferol (VITAMIN D-3 PO) Take 1 capsule by mouth daily after breakfast.   Yes [provider]  Cyanocobalamin (B-12 PO) Take 1 tablet by mouth daily after breakfast.    Yes [provider]  diclofenac sodium (VOLTAREN) 1 % GEL Apply 2 g topically 4 (four) times daily. Patient taking differently: Apply 2 g topically 4 (four) times daily as needed (pain).  10/30/18  Yes Rodriguez-Southworth, Sunday Spillers, PA-C  famotidine (PEPCID) 40 MG tablet Take 40 mg by mouth 2 (two) times daily. 05/22/20  Yes [provider]  ferrous sulfate 325 (65 FE) MG tablet  Take 325 mg by mouth daily with breakfast.   Yes [provider]  HUMALOG KWIKPEN 200 UNIT/ML KwikPen INJECT 3 UNITS UNDER THE SKIN BEFORE BREAKFAST, LUNCH, DINNER IF SUGARS ARE GREATER THAN 175. Patient taking differently: Inject 3 Units into the skin 3 (three) times daily as needed (Blood Sugar > 175).  02/11/20  Yes [name], MD  isosorbide-hydrALAZINE (BIDIL) 20-37.5 MG tablet Take 1 tablet by mouth 3 (three) times daily. 02/28/20  Yes Varanasi, Charlann Lange, MD  LEVEMIR FLEXTOUCH 100 UNIT/ML FlexPen INJECT 30 UNITS UNDER THE SKIN EVERY NIGHT AT BEDTIME AS DIRECTED PER SLIDING SCALE PROTOCOL Patient taking differently: Inject 0-30 Units into the skin at bedtime.  02/11/20  Yes [name], MD  metoprolol succinate (  TOPROL-XL) 50 MG 24 hr tablet TAKE 1 TABLET BY MOUTH EVERY DAY 12/11/19  Yes Minette Brine, FNP  mirtazapine (REMERON) 7.5 MG tablet TAKE 1 TABLET(7.5 MG) BY MOUTH AT BEDTIME Patient taking differently: Take 7.5 mg by mouth at bedtime.  04/14/20  Yes [name], MD  MYRBETRIQ 50 MG TB24 tablet Take 50 mg by mouth daily. 05/24/20  Yes [provider]  omeprazole (PRILOSEC) 40 MG capsule TAKE 1 CAPSULE BY MOUTH DAILY BEFORE A MEAL Patient taking differently: Take 40 mg by mouth in the morning, at noon, and at bedtime.  12/11/19  Yes [name], MD  oxybutynin (DITROPAN) 5 MG tablet TAKE 1 TABLET(5 MG) BY MOUTH TWICE DAILY Patient taking differently: Take 5 mg by mouth 2 (two) times daily.  12/23/19  Yes [name], MD  potassium chloride SA (KLOR-CON) 20 MEQ tablet TAKE 2 TABLETS(40 MEQ) BY MOUTH DAILY Patient taking differently: Take 40 mEq by mouth daily.  06/17/20  Yes [name], MD  pregabalin (LYRICA) 75 MG capsule TAKE ONE CAPSULE BY MOUTH AT BEDTIME 04/06/20  Yes [name], MD  Semaglutide,0.25 or 0.5MG/DOS, (OZEMPIC, 0.25 OR 0.5 MG/DOSE,) 2 MG/1.5ML SOPN Inject 0.25 mg into the skin once a week. Take on Wednesday   Yes [provider]   torsemide (DEMADEX) 20 MG tablet Take 1 tablet (20 mg total) by mouth 2 (two) times daily. You may take an extra 20 mg tablet daily as needed for swelling 01/14/20  Yes Jettie Booze, MD  traMADol (ULTRAM) 50 MG tablet Take 1 tablet (50 mg total) by mouth every 12 (twelve) hours as needed. Patient taking differently: Take 50 mg by mouth every 12 (twelve) hours as needed for moderate pain or severe pain.  05/19/20  Yes [name], MD  ACCU-CHEK AVIVA PLUS test strip USE AS DIRECTED TO CHECK BLOOD SUGAR FOUR TIMES DAILY 12/11/19   [name], MD  Accu-Chek Softclix Lancets lancets Use as instructed to check blood sugars 4 times per day dx: e11.65 04/06/20   [name], MD  BD PEN NEEDLE NANO 2ND GEN 32G X 4 MM MISC USE AS DIRECTED 04/17/20   [name], MD  Blood Glucose Monitoring Suppl (ACCU-CHEK AVIVA PLUS) w/Device KIT USE AS DIRECTED TO CHECK BLOOD SUGAR FOUR TIMES DAILY dx: e11.65 04/06/20   [name], MD  insulin detemir (LEVEMIR) 100 UNIT/ML injection Take 16 units in the morning and 10 units in the evening. Patient not taking: Reported on 06/22/2020 09/17/19   Minette Brine, FNP    Physical Exam: Vitals:   06/22/20 1745 06/22/20 1800 06/22/20 1815 06/22/20 1830  BP: (!) 113/58 (!) 100/56 (!) 101/57 106/60  Pulse: 64 (!) 59 60 (!) 118  Resp: (!) '24 20 18 20  ' Temp:      TempSrc:      SpO2: 99% 97% 97% 99%      Constitutional: Acutely ill looking with no distress Vitals:   06/22/20 1745 06/22/20 1800 06/22/20 1815 06/22/20 1830  BP: (!) 113/58 (!) 100/56 (!) 101/57 106/60  Pulse: 64 (!) 59 60 (!) 118  Resp: (!) '24 20 18 20  ' Temp:      TempSrc:      SpO2: 99% 97% 97% 99%   Eyes: PERRL, lids and conjunctivae normal ENMT: Mucous membranes are dry. Posterior pharynx clear of any exudate or lesions.Normal dentition.  Neck: normal, supple, no masses, no thyromegaly Respiratory: clear to auscultation bilaterally, no wheezing, no crackles. Normal respiratory  effort. No accessory muscle  use.  Cardiovascular: Regular rate and rhythm, no murmurs / rubs / gallops. No extremity edema. 2+ pedal pulses. No carotid bruits.  Abdomen: no tenderness, no masses palpated. No hepatosplenomegaly. Bowel sounds positive.  Musculoskeletal: no clubbing / cyanosis. No joint deformity upper and lower extremities. Good ROM, no contractures. Normal muscle tone.  Skin: no rashes, lesions, ulcers. No induration Neurologic: CN 2-12 grossly intact. Sensation intact, DTR normal. Strength 5/5 in all 4.  Psychiatric: Patient withdrawn, awake and alert but not communicating well    Labs on Admission: I have personally reviewed following labs and imaging studies  CBC: Recent Labs  Lab 06/22/20 1621  WBC 3.2*  NEUTROABS 1.8  HGB 10.5*  HCT 32.8*  MCV 96.5  PLT 94*   Basic Metabolic Panel: Recent Labs  Lab 06/22/20 1621  NA 140  K 3.4*  CL 101  CO2 28  GLUCOSE 171*  BUN 33*  CREATININE 1.78*  CALCIUM 8.1*   GFR: Estimated Creatinine Clearance: 25.2 mL/min (A) (by C-G formula based on SCr of 1.78 mg/dL (H)). Liver Function Tests: Recent Labs  Lab 06/22/20 1621  AST 65*  ALT 55*  ALKPHOS 64  BILITOT 0.4  PROT 7.2  ALBUMIN 3.4*   Recent Labs  Lab 06/22/20 1621  LIPASE 22   No results for input(s): AMMONIA in the last 168 hours. Coagulation Profile: No results for input(s): INR, PROTIME in the last 168 hours. Cardiac Enzymes: No results for input(s): CKTOTAL, CKMB, CKMBINDEX, TROPONINI in the last 168 hours. BNP (last 3 results) No results for input(s): PROBNP in the last 8760 hours. HbA1C: No results for input(s): HGBA1C in the last 72 hours. CBG: No results for input(s): GLUCAP in the last 168 hours. Lipid Profile: No results for input(s): CHOL, HDL, LDLCALC, TRIG, CHOLHDL, LDLDIRECT in the last 72 hours. Thyroid Function Tests: No results for input(s): TSH, T4TOTAL, FREET4, T3FREE, THYROIDAB in the last 72 hours. Anemia Panel: No  results for input(s): VITAMINB12, FOLATE, FERRITIN, TIBC, IRON, RETICCTPCT in the last 72 hours. Urine analysis:    Component Value Date/Time   COLORURINE YELLOW 08/10/2019 1624   APPEARANCEUR HAZY (A) 08/10/2019 1624   LABSPEC 1.008 08/10/2019 1624   PHURINE 5.0 08/10/2019 1624   GLUCOSEU NEGATIVE 08/10/2019 1624   HGBUR NEGATIVE 08/10/2019 1624   BILIRUBINUR negative 11/28/2019 1206   KETONESUR NEGATIVE 08/10/2019 1624   PROTEINUR Negative 11/28/2019 1206   PROTEINUR NEGATIVE 08/10/2019 1624   UROBILINOGEN 0.2 11/28/2019 1206   UROBILINOGEN 0.2 10/12/2012 2037   NITRITE positive 11/28/2019 1206   NITRITE NEGATIVE 08/10/2019 1624   LEUKOCYTESUR Large (3+) (A) 11/28/2019 1206   LEUKOCYTESUR LARGE (A) 08/10/2019 1624   Sepsis Labs: '@LABRCNTIP' (procalcitonin:4,lacticidven:4) ) Recent Results (from the past 240 hour(s))  SARS Coronavirus 2 by RT PCR (hospital order, performed in [facility] hospital lab) Nasopharyngeal Nasopharyngeal Swab     Status: Abnormal   Collection Time: 06/19/20 11:50 PM   Specimen: Nasopharyngeal Swab  Result Value Ref Range Status   SARS Coronavirus 2 POSITIVE (A) NEGATIVE Final    Comment: RESULT CALLED TO, READ BACK BY AND VERIFIED WITH: ADKINS,C AT 2952 ON [id number] BY CHERESNOWSKY,T (NOTE) SARS-CoV-2 target nucleic acids are DETECTED  SARS-CoV-2 RNA is generally detectable in upper respiratory specimens  during the acute phase of infection.  Positive results are indicative  of the presence of the identified virus, but do not rule out bacterial infection or co-infection with other pathogens not detected by the test.  Clinical correlation with patient history  and  other diagnostic information is necessary to determine patient infection status.  The expected result is negative.  Fact Sheet for Patients:   StrictlyIdeas.no   Fact Sheet for Healthcare Providers:   BankingDealers.co.za    This test is not  yet approved or cleared by the [place] FDA and  has been authorized for detection and/or diagnosis of SARS-CoV-2 by FDA under an Emergency Use Authorization (EUA).  This EUA will remain in effect (meani ng this test can be used) for the duration of  the COVID-19 declaration under Section 564(b)(1) of the Act, 21 U.S.C. section 360-bbb-3(b)(1), unless the authorization is terminated or revoked sooner.  Performed at [facility], [address] [place], [place] [address]      Radiological Exams on Admission: DG Chest [facility] 1 View  Result Date: 06/22/2020 CLINICAL DATA:  COVID positive, weakness EXAM: PORTABLE CHEST 1 VIEW COMPARISON:  06/20/2020 FINDINGS: Chronic mild interstitial prominence. No new consolidation. No pleural effusion. Stable cardiomediastinal contours. Left chest wall pacemaker. IMPRESSION: No significant change since 06/20/2020. Electronically Signed   By: Macy Mis M.D.   On: 06/22/2020 16:29    EKG: Independently reviewed.  Sinus rhythm with left bundle branch block  Assessment/Plan Principal Problem:   Syncope and collapse Active Problems:   Acute on chronic diastolic CHF (congestive heart failure) (H[facility])   CKD (chronic kidney disease), stage III (HCC)   LBBB (left bundle branch block)   DM (diabetes mellitus) ([place])   Malignant neoplasm of lower-inner quadrant of right breast of female, estrogen receptor positive ([place])   COVID-19 virus infection   Hypokalemia     #1 syncope: Patient has syncopal episode probably dehydration and possible reaction to her monoclonal antibodies and COVID-19 infection.  We will admit the patient.  Hydrate patient.  Recheck orthostatics tomorrow.  PT OT evaluation and echocardiogram.  #2  COVID-19 infection: No active respiratory disease.  Have GI symptoms may be COVID-19 related.  Patient will be treated symptomatically.  #3 diabetes: Blood sugar well controlled.  Continue treatment  #4 chronic kidney  disease stage III: Appears to be at her baseline.  Continue treatment  #5 chronic LBBB: Patient could have had arrhythmia leading to her syncope.  We will get an echo and monitor on telemetry.  #6 CHF: Combined diastolic and systolic.  Appears compensated.     DVT prophylaxis: Eliquis Code Status: Full code Family Communication: No family at bedside Disposition Plan: Home Consults called: None Admission status: Inpatient  Severity of Illness: The appropriate patient status for this patient is OBSERVATION. Observation status is judged to be reasonable and necessary in order to provide the required intensity of service to ensure the patient's safety. The patient's presenting symptoms, physical exam findings, and initial radiographic and laboratory data in the context of their medical condition is felt to place them at decreased risk for further clinical deterioration. Furthermore, it is anticipated that the patient will be medically stable for discharge from the hospital within 2 midnights of admission. The following factors support the patient status of observation.   " The patient's presenting symptoms include syncope. " The physical exam findings include dry mucous membranes. " The initial radiographic and laboratory data are dehydration and acute kidney injury.     Barbette Merino MD [facility] Pager [phone]  If 7PM-7AM, please contact night-coverage [email or web address] Password [email or web address]  06/22/2020, 7:14 PM

## 2020-06-22 NOTE — ED Notes (Signed)
XR at bedside

## 2020-06-23 ENCOUNTER — Ambulatory Visit: Payer: Medicare Other

## 2020-06-23 ENCOUNTER — Other Ambulatory Visit: Payer: Self-pay

## 2020-06-23 ENCOUNTER — Inpatient Hospital Stay (HOSPITAL_COMMUNITY): Payer: Medicare Other

## 2020-06-23 ENCOUNTER — Encounter (HOSPITAL_COMMUNITY): Payer: Self-pay | Admitting: Internal Medicine

## 2020-06-23 DIAGNOSIS — R55 Syncope and collapse: Secondary | ICD-10-CM

## 2020-06-23 DIAGNOSIS — I5032 Chronic diastolic (congestive) heart failure: Secondary | ICD-10-CM

## 2020-06-23 DIAGNOSIS — U071 COVID-19: Secondary | ICD-10-CM

## 2020-06-23 DIAGNOSIS — N184 Chronic kidney disease, stage 4 (severe): Secondary | ICD-10-CM

## 2020-06-23 DIAGNOSIS — E1122 Type 2 diabetes mellitus with diabetic chronic kidney disease: Secondary | ICD-10-CM

## 2020-06-23 LAB — HEMOGLOBIN A1C
Hgb A1c MFr Bld: 9.1 % — ABNORMAL HIGH (ref 4.8–5.6)
Mean Plasma Glucose: 214.47 mg/dL

## 2020-06-23 LAB — CBC
HCT: 32.8 % — ABNORMAL LOW (ref 36.0–46.0)
Hemoglobin: 10.4 g/dL — ABNORMAL LOW (ref 12.0–15.0)
MCH: 30.4 pg (ref 26.0–34.0)
MCHC: 31.7 g/dL (ref 30.0–36.0)
MCV: 95.9 fL (ref 80.0–100.0)
Platelets: 89 10*3/uL — ABNORMAL LOW (ref 150–400)
RBC: 3.42 MIL/uL — ABNORMAL LOW (ref 3.87–5.11)
RDW: 14.1 % (ref 11.5–15.5)
WBC: 2.4 10*3/uL — ABNORMAL LOW (ref 4.0–10.5)
nRBC: 0 % (ref 0.0–0.2)

## 2020-06-23 LAB — GLUCOSE, CAPILLARY
Glucose-Capillary: 155 mg/dL — ABNORMAL HIGH (ref 70–99)
Glucose-Capillary: 115 mg/dL — ABNORMAL HIGH (ref 70–99)

## 2020-06-23 LAB — COMPREHENSIVE METABOLIC PANEL
ALT: 47 U/L — ABNORMAL HIGH (ref 0–44)
AST: 55 U/L — ABNORMAL HIGH (ref 15–41)
Albumin: 3.1 g/dL — ABNORMAL LOW (ref 3.5–5.0)
Alkaline Phosphatase: 55 U/L (ref 38–126)
Anion gap: 12 (ref 5–15)
BUN: 33 mg/dL — ABNORMAL HIGH (ref 8–23)
CO2: 26 mmol/L (ref 22–32)
Calcium: 7.9 mg/dL — ABNORMAL LOW (ref 8.9–10.3)
Chloride: 102 mmol/L (ref 98–111)
Creatinine, Ser: 1.65 mg/dL — ABNORMAL HIGH (ref 0.44–1.00)
GFR calc Af Amer: 32 mL/min — ABNORMAL LOW (ref >60)
GFR calc non Af Amer: 28 mL/min — ABNORMAL LOW (ref >60)
Glucose, Bld: 139 mg/dL — ABNORMAL HIGH (ref 70–99)
Potassium: 2.9 mmol/L — ABNORMAL LOW (ref 3.5–5.1)
Sodium: 140 mmol/L (ref 135–145)
Total Bilirubin: 0.5 mg/dL (ref 0.3–1.2)
Total Protein: 6.6 g/dL (ref 6.5–8.1)

## 2020-06-23 LAB — URINALYSIS, ROUTINE W REFLEX MICROSCOPIC
Bilirubin Urine: NEGATIVE
Glucose, UA: NEGATIVE mg/dL
Hgb urine dipstick: NEGATIVE
Ketones, ur: NEGATIVE mg/dL
Leukocytes,Ua: NEGATIVE
Nitrite: NEGATIVE
Protein, ur: NEGATIVE mg/dL
Specific Gravity, Urine: 1.009 (ref 1.005–1.030)
pH: 5 (ref 5.0–8.0)

## 2020-06-23 LAB — ECHOCARDIOGRAM COMPLETE
Area-P 1/2: 2.69 cm2
S' Lateral: 3.2 cm

## 2020-06-23 LAB — CBG MONITORING, ED
Glucose-Capillary: 135 mg/dL — ABNORMAL HIGH (ref 70–99)
Glucose-Capillary: 148 mg/dL — ABNORMAL HIGH (ref 70–99)
Glucose-Capillary: 251 mg/dL — ABNORMAL HIGH (ref 70–99)

## 2020-06-23 MED ORDER — FAMOTIDINE 20 MG PO TABS
20.0000 mg | ORAL_TABLET | Freq: Every day | ORAL | Status: DC
  Administered 2020-06-24 – 2020-07-01 (×8): 20 mg via ORAL
  Filled 2020-06-23 (×8): qty 1

## 2020-06-23 NOTE — Chronic Care Management (AMB) (Signed)
  Chronic Care Management   Inpatient Admit Review Note  06/23/2020 Name: [name] MRN: [id number] DOB: [birth year]  [name] is a 86 y.o. year old female who is a primary care patient of [name], MD. [name] is actively engaged with the embedded care management team in the primary care practice and is being followed by RN Case Manager for assistance with disease management and care coordination needs related to CHF and DMII.   [name] is currently in the ED with plan for admission for evaluation and treatment of COVID 19. Current treatment plan is to admit the patient to continue treatment.  Plan: CM team will collaborate with [facility] and will follow patient post discharge.    [name], BSW, CDP Social Worker, Certified Dementia Practitioner [facility] [phone]

## 2020-06-23 NOTE — Evaluation (Signed)
Physical Therapy Evaluation Patient Details Name: [name] MRN: [id number] DOB: 1932/11/20 Today's Date: 06/23/2020   History of Present Illness  Patient is a 86 y.o. female with medical history significant of breast cancer, chronic combined systolic and diastolic CHF, chronic kidney disease stage III, history of CVA, history of left bundle branch block as well as mitral regurgitation, essential hypertension diabetes who was diagnosed with COVID-19 infection 3 days PTA with fever and chills but no respiratory distress.  Patient also was sent to get antibody infusion 06/21/20.   Onset of watery diarrhea after returning home with syncopal episode.  Clinical Impression  Evaluation limited due to diarrhea. Unable to mobilize from stretcher at the time. Patient resides withndaughter and his mod Independent for transfers in her home. Has HHPT on board per patient. Hopefully patient will be able to safely mobilize to maintain strength and functional level to return home.  Pt admitted with above diagnosis.   Pt currently with functional limitations due to the deficits listed below (see PT Problem List). Pt will benefit from skilled PT to increase their independence and safety with mobility to allow discharge to the venue listed below.       Follow Up Recommendations Home health PT (if has caregivers)    Equipment Recommendations  None recommended by PT    Recommendations for Other Services       Precautions / Restrictions Precautions Precautions: Fall Precaution Comments: diarrhea      Mobility  Bed Mobility Overal bed mobility: Needs Assistance Bed Mobility: Rolling;Supine to Sit           General bed mobility comments: initiated  mobility with  patient declining to further mobilize due to diarrhea,  Transfers                 General transfer comment: TBA  Ambulation/Gait                Stairs            Wheelchair Mobility    Modified Rankin (Stroke  Patients Only)       Balance                                             Pertinent Vitals/Pain Pain Assessment: No/denies pain    Home Living Family/patient expects to be discharged to:: Private residence Living Arrangements: Children;Other relatives Available Help at Discharge: Family;Available PRN/intermittently Type of Home: Apartment Home Access: Level entry     Home Layout: One level Home Equipment: Walker - 4 wheels;Wheelchair - [employer] - 2 wheels;Bedside commode;Tub bench;Grab bars - toilet Additional Comments: patient  uses [place] for mod I mobiliyty, HHPT working on gait with RW.    Prior Function                 Hand Dominance        Extremity/Trunk Assessment   Upper Extremity Assessment Upper Extremity Assessment: RUE deficits/detail;Defer to OT evaluation    Lower Extremity Assessment Lower Extremity Assessment: RLE deficits/detail RLE Deficits / Details: grossly  2+       Communication      Cognition Arousal/Alertness: Awake/alert Behavior During Therapy: WFL for tasks assessed/performed Overall Cognitive Status: Within Functional Limits for tasks assessed  General Comments General comments (skin integrity, edema, etc.): TBA    Exercises     Assessment/Plan    PT Assessment Patient needs continued PT services  PT Problem List Decreased strength;Decreased mobility;Decreased range of motion;Decreased activity tolerance;Decreased balance;Decreased knowledge of use of DME       PT Treatment Interventions Therapeutic exercise;Functional mobility training;Therapeutic activities;Patient/family education;Wheelchair mobility training    PT Goals (Current goals can be found in the Care Plan section)  Acute Rehab PT Goals Patient Stated Goal: tp go home PT Goal Formulation: With patient Time For Goal Achievement: 07/07/20 Potential to Achieve Goals: Fair     Frequency Min 3X/week   Barriers to discharge        Co-evaluation               AM-PAC PT "6 Clicks" Mobility  Outcome Measure Help needed turning from your back to your side while in a flat bed without using bedrails?: A Lot Help needed moving from lying on your back to sitting on the side of a flat bed without using bedrails?: A Lot Help needed moving to and from a bed to a chair (including a wheelchair)?: A Lot Help needed standing up from a chair using your arms (e.g., wheelchair or bedside chair)?: A Lot Help needed to walk in hospital room?: Total Help needed climbing 3-5 steps with a railing? : Total 6 Click Score: 10    End of Session   Activity Tolerance: Treatment limited secondary to medical complications (Comment) (diarrhea) Patient left: in bed;with call bell/phone within reach Nurse Communication: Mobility status PT Visit Diagnosis: Unsteadiness on feet (R26.81);Hemiplegia and hemiparesis Hemiplegia - Right/Left: Right Hemiplegia - dominant/non-dominant: Dominant Hemiplegia - caused by: Other cerebrovascular disease    Time: 8891-6945 PT Time Calculation (min) (ACUTE ONLY): 21 min   Charges:   PT Evaluation $PT Eval Low Complexity: [facility] PT Acute Rehabilitation Services Pager [phone] Office [phone]   Claretha Cooper 06/23/2020, 3:28 PM

## 2020-06-23 NOTE — ED Notes (Signed)
Pt states not able to stand due to weakness, unable to complete Orthos

## 2020-06-23 NOTE — ED Notes (Signed)
Pharmacy contacted to verify meds

## 2020-06-23 NOTE — Progress Notes (Addendum)
PROGRESS NOTE    [name]  GBT:[id number] DOB: [birth year] DOA: 06/22/2020 PCP: [name], MD   Brief Narrative:  Patient is a 86 y.o. female with medical history significant of breast cancer, chronic combined systolic and diastolic CHF, chronic kidney disease stage III, history of CVA, history of left bundle branch block as well as mitral regurgitation, essential hypertension diabetes who was diagnosed with COVID-19 infection 3 days ago.  At the time she only had fever and chills but no respiratory distress.  Patient also was sent to get antibody infusion yesterday.  She got it and was doing fine but when she got home she started having diarrhea.  She had multiple episodes of diarrhea.  She was in her kitchen this afternoon when she felt weak drowsy and passed out.  No injuries.  Did not hit her head.  She was brought to the ER where she is now being admitted for evaluation of syncope.  She appears to be orthostatic.  Patient currently having a headache and laying down in bed.  Patient suspected to have had syncope secondary to dehydration as well as her newly diagnosed COVID-19 infection. Temperature 98.3 blood pressure 149/94 pulse 190 respirate 24 oxygen sats 95% room air.  Sodium is 140 potassium 3.4 chloride 101 glucose 171.  Creatinine 1.78.  Lactic acid is 1.5 troponin 85.  White count 3.2 hemoglobin 10.5.  Platelets 94.  Urinalysis essentially negative.  EKG showed sinus rhythm with left bundle branch block.  No significant change. Her COVID-19 was +3 days ago.  Chest x-ray showed no active infiltrates. Patient being admitted for syncopal work-up   Assessment & Plan:   Principal Problem:   Syncope and collapse Active Problems:   Acute on chronic diastolic CHF (congestive heart failure) (HCC)   CKD (chronic kidney disease), stage III (HCC)   LBBB (left bundle branch block)   DM (diabetes mellitus) ([place])   Malignant neoplasm of lower-inner quadrant of right breast of female,  estrogen receptor positive ([place])   COVID-19 virus infection   Hypokalemia   Acute syncopal event in the setting of hypotension and dehydration   -Poor p.o. intake admitted by the patient over approximately 1 week timeframe  -Unclear if related to patient's recent monoclonal antibody treatment although less likely given timeframe  -Patient does have COVID-19, without respiratory disease or pneumonia possibly GI symptoms given diarrhea as below -Continue IV fluids, advanced p.o. diet as tolerated - PT OT evaluation and echocardiogram pending.  Acute COVID-19 infection, without respiratory symptoms, intractable diarrhea:  -Patient remains on room air without hypoxia denies any respiratory symptoms, GI symptoms ongoing but markedly improving  -Patient's above syncopal event likely the setting of volume loss poor p.o. intake.   -Patient has no inflammatory markers since admission, will order with morning labs No results for input(s): DDIMER, FERRITIN, LDH, CRP in the last 72 hours.  Insulin-dependent diabetes, well controlled: -Continue sliding scale insulin, hypoglycemic protocol -Lantus 30 daily  Chronic kidney disease stage IIIb:  - Appears to be at her baseline. Continue treatment  Chronic LBBB  - Patient could have had arrhythmia leading to her syncope.   - We will get an echo and monitor on telemetry.  History of DVT, on full dose anticoagulation  -Continue home dose Eliquis  CHF: Combined diastolic and systolic   -Continues to appear dehydrated continue IV fluids, monitor closely  DVT prophylaxis: Eliquis Code Status: Full code Family Communication: Updated over the phone  Status is: Inpatient  Dispo: The  patient is from: Home              Anticipated d/c is to: To be determined              Anticipated d/c date is: 48 to 72 hours pending clinical course              Patient currently not medically stable for discharge given ongoing GI losses, ongoing syncopal event  evaluation with poor p.o. intake and dehydration.  Consultants:   None  Procedures:   None indicated  Antimicrobials:  None  Subjective: No acute issues or events overnight, patient indicates she feels less fatigued weak and dehydrated with IV fluids.  Ongoing diarrhea but improving in duration and frequency.  Denies nausea, vomiting, constipation, headache, fevers, chills.  Objective: Vitals:   06/23/20 1200 06/23/20 1230 06/23/20 1300 06/23/20 1330  BP: 126/69 117/61 (!) 103/55 (!) 103/55  Pulse: 65 72 64 64  Resp: 18 18  18   Temp:      TempSrc:      SpO2: 98% 100% 97% 97%    Intake/Output Summary (Last 24 hours) at 06/23/2020 1404 Last data filed at 06/23/2020 0555 Gross per 24 hour  Intake 95.86 ml  Output --  Net 95.86 ml   There were no vitals filed for this visit.  Examination:  General:  Pleasantly resting in bed, No acute distress. HEENT:  Normocephalic atraumatic.  Sclerae nonicteric, noninjected.  Extraocular movements intact bilaterally. Neck:  Without mass or deformity.  Trachea is midline. Lungs:  Clear to auscultate bilaterally without rhonchi, wheeze, or rales. Heart:  Regular rate and rhythm.  Without murmurs, rubs, or gallops. Abdomen:  Soft, nontender, nondistended.  Without guarding or rebound. Extremities: Without cyanosis, clubbing, edema, or obvious deformity. Vascular:  Dorsalis pedis and posterior tibial pulses palpable bilaterally. Skin:  Warm and dry, no erythema, no ulcerations.  Data Reviewed: I have personally reviewed following labs and imaging studies  CBC: Recent Labs  Lab 06/22/20 1621 06/23/20 0446  WBC 3.2* 2.4*  NEUTROABS 1.8  --   HGB 10.5* 10.4*  HCT 32.8* 32.8*  MCV 96.5 95.9  PLT 94* 89*   Basic Metabolic Panel: Recent Labs  Lab 06/22/20 1621 06/23/20 0446  NA 140 140  K 3.4* 2.9*  CL 101 102  CO2 28 26  GLUCOSE 171* 139*  BUN 33* 33*  CREATININE 1.78* 1.65*  CALCIUM 8.1* 7.9*   GFR: Estimated  Creatinine Clearance: 27.2 mL/min (A) (by C-G formula based on SCr of 1.65 mg/dL (H)). Liver Function Tests: Recent Labs  Lab 06/22/20 1621 06/23/20 0446  AST 65* 55*  ALT 55* 47*  ALKPHOS 64 55  BILITOT 0.4 0.5  PROT 7.2 6.6  ALBUMIN 3.4* 3.1*   Recent Labs  Lab 06/22/20 1621  LIPASE 22   No results for input(s): AMMONIA in the last 168 hours. Coagulation Profile: No results for input(s): INR, PROTIME in the last 168 hours. Cardiac Enzymes: No results for input(s): CKTOTAL, CKMB, CKMBINDEX, TROPONINI in the last 168 hours. BNP (last 3 results) No results for input(s): PROBNP in the last 8760 hours. HbA1C: Recent Labs    06/22/20 1621  HGBA1C 9.1*   CBG: Recent Labs  Lab 06/23/20 0553 06/23/20 0845 06/23/20 1137  GLUCAP 135* 148* 251*   Lipid Profile: No results for input(s): CHOL, HDL, LDLCALC, TRIG, CHOLHDL, LDLDIRECT in the last 72 hours. Thyroid Function Tests: No results for input(s): TSH, T4TOTAL, FREET4, T3FREE, THYROIDAB in the last 72  hours. Anemia Panel: No results for input(s): VITAMINB12, FOLATE, FERRITIN, TIBC, IRON, RETICCTPCT in the last 72 hours. Sepsis Labs: Recent Labs  Lab 06/22/20 1621  LATICACIDVEN 1.5    Recent Results (from the past 240 hour(s))  SARS Coronavirus 2 by RT PCR (hospital order, performed in [facility] hospital lab) Nasopharyngeal Nasopharyngeal Swab     Status: Abnormal   Collection Time: 06/19/20 11:50 PM   Specimen: Nasopharyngeal Swab  Result Value Ref Range Status   SARS Coronavirus 2 POSITIVE (A) NEGATIVE Final    Comment: RESULT CALLED TO, READ BACK BY AND VERIFIED WITH: ADKINS,C AT 1884 ON [id number] BY CHERESNOWSKY,T (NOTE) SARS-CoV-2 target nucleic acids are DETECTED  SARS-CoV-2 RNA is generally detectable in upper respiratory specimens  during the acute phase of infection.  Positive results are indicative  of the presence of the identified virus, but do not rule out bacterial infection or co-infection with  other pathogens not detected by the test.  Clinical correlation with patient history and  other diagnostic information is necessary to determine patient infection status.  The expected result is negative.  Fact Sheet for Patients:   StrictlyIdeas.no   Fact Sheet for Healthcare Providers:   BankingDealers.co.za    This test is not yet approved or cleared by the [place] FDA and  has been authorized for detection and/or diagnosis of SARS-CoV-2 by FDA under an Emergency Use Authorization (EUA).  This EUA will remain in effect (meani ng this test can be used) for the duration of  the COVID-19 declaration under Section 564(b)(1) of the Act, 21 U.S.C. section 360-bbb-3(b)(1), unless the authorization is terminated or revoked sooner.  Performed at [facility], [address], [place], [place] [address]          Radiology Studies: DG Chest [place] 1 View  Result Date: 06/22/2020 CLINICAL DATA:  COVID positive, weakness EXAM: PORTABLE CHEST 1 VIEW COMPARISON:  06/20/2020 FINDINGS: Chronic mild interstitial prominence. No new consolidation. No pleural effusion. Stable cardiomediastinal contours. Left chest wall pacemaker. IMPRESSION: No significant change since 06/20/2020. Electronically Signed   By: Macy Mis M.D.   On: 06/22/2020 16:29        Scheduled Meds: . amiodarone  200 mg Oral BID  . anastrozole  1 mg Oral Daily  . apixaban  2.5 mg Oral BID  . atorvastatin  20 mg Oral QHS  . carvedilol  12.5 mg Oral BID WC  . cholecalciferol  1,000 Units Oral Daily  . [START ON 06/24/2020] famotidine  20 mg Oral Daily  . ferrous sulfate  325 mg Oral Q breakfast  . insulin aspart  0-15 Units Subcutaneous TID WC  . insulin aspart  0-5 Units Subcutaneous QHS  . insulin detemir  30 Units Subcutaneous QHS  . isosorbide-hydrALAZINE  1 tablet Oral TID  . metoprolol succinate  50 mg Oral Daily  . mirabegron ER  50 mg Oral Daily   . mirtazapine  7.5 mg Oral QHS  . oxybutynin  5 mg Oral BID  . pantoprazole  40 mg Oral Daily  . potassium chloride SA  40 mEq Oral Daily  . pregabalin  75 mg Oral QHS  . sodium chloride flush  3 mL Intravenous Q12H  . torsemide  20 mg Oral BID  . vitamin B-12  250 mcg Oral Daily   Continuous Infusions: . sodium chloride 100 mL/hr at 06/23/20 0555     LOS: 1 day   Time spent: 79min  Rhen Dossantos C Tabria Steines, DO  [facility]  If 7PM-7AM, please contact night-coverage [email or web address]  06/23/2020, 2:04 PM

## 2020-06-23 NOTE — Progress Notes (Signed)
  Echocardiogram 2D Echocardiogram has been performed.  [name] 06/23/2020, 3:56 PM

## 2020-06-24 ENCOUNTER — Telehealth: Payer: Medicare Other

## 2020-06-24 LAB — CBC WITH DIFFERENTIAL/PLATELET
Abs Immature Granulocytes: 0.01 10*3/uL (ref 0.00–0.07)
Basophils Absolute: 0 10*3/uL (ref 0.0–0.1)
Basophils Relative: 0 %
Eosinophils Absolute: 0.1 10*3/uL (ref 0.0–0.5)
Eosinophils Relative: 3 %
HCT: 31.2 % — ABNORMAL LOW (ref 36.0–46.0)
Hemoglobin: 9.9 g/dL — ABNORMAL LOW (ref 12.0–15.0)
Immature Granulocytes: 0 %
Lymphocytes Relative: 39 %
Lymphs Abs: 0.9 10*3/uL (ref 0.7–4.0)
MCH: 30.7 pg (ref 26.0–34.0)
MCHC: 31.7 g/dL (ref 30.0–36.0)
MCV: 96.6 fL (ref 80.0–100.0)
Monocytes Absolute: 0.3 10*3/uL (ref 0.1–1.0)
Monocytes Relative: 11 %
Neutro Abs: 1.1 10*3/uL — ABNORMAL LOW (ref 1.7–7.7)
Neutrophils Relative %: 47 %
Platelets: 93 10*3/uL — ABNORMAL LOW (ref 150–400)
RBC: 3.23 MIL/uL — ABNORMAL LOW (ref 3.87–5.11)
RDW: 14.2 % (ref 11.5–15.5)
WBC: 2.3 10*3/uL — ABNORMAL LOW (ref 4.0–10.5)
nRBC: 0 % (ref 0.0–0.2)

## 2020-06-24 LAB — BASIC METABOLIC PANEL
Anion gap: 11 (ref 5–15)
BUN: 30 mg/dL — ABNORMAL HIGH (ref 8–23)
CO2: 26 mmol/L (ref 22–32)
Calcium: 8.1 mg/dL — ABNORMAL LOW (ref 8.9–10.3)
Chloride: 105 mmol/L (ref 98–111)
Creatinine, Ser: 1.5 mg/dL — ABNORMAL HIGH (ref 0.44–1.00)
GFR calc Af Amer: 36 mL/min — ABNORMAL LOW (ref >60)
GFR calc non Af Amer: 31 mL/min — ABNORMAL LOW (ref >60)
Glucose, Bld: 87 mg/dL (ref 70–99)
Potassium: 3.5 mmol/L (ref 3.5–5.1)
Sodium: 142 mmol/L (ref 135–145)

## 2020-06-24 LAB — MAGNESIUM: Magnesium: 1.9 mg/dL (ref 1.7–2.4)

## 2020-06-24 LAB — GLUCOSE, CAPILLARY
Glucose-Capillary: 51 mg/dL — ABNORMAL LOW (ref 70–99)
Glucose-Capillary: 74 mg/dL (ref 70–99)
Glucose-Capillary: 148 mg/dL — ABNORMAL HIGH (ref 70–99)
Glucose-Capillary: 122 mg/dL — ABNORMAL HIGH (ref 70–99)
Glucose-Capillary: 136 mg/dL — ABNORMAL HIGH (ref 70–99)

## 2020-06-24 MED ORDER — INSULIN DETEMIR 100 UNIT/ML ~~LOC~~ SOLN
25.0000 [IU] | Freq: Every day | SUBCUTANEOUS | Status: DC
  Administered 2020-06-24 – 2020-06-25 (×2): 25 [IU] via SUBCUTANEOUS
  Filled 2020-06-24 (×2): qty 0.25

## 2020-06-24 NOTE — Consult Note (Signed)
   [facility] CM Inpatient Consult   06/24/2020  BRENLY TRAWICK [birth year] [id number]   Patient is currently active with [facility] for chronic care management services with case manager embedded at primary care office, [place].  [facility] CM aware of patient's hospital admission.   Per chart review, current recommendation plan is for SNF.  Hospital liaison will continue to follow for progression.   Of note, [facility] Care Management services does not replace or interfere with any services that are arranged by inpatient case management or social work.  Netta Cedars, MSN, [facility] Liaison Nurse Mobile Phone [phone]  Toll free office [phone]

## 2020-06-24 NOTE — Progress Notes (Signed)
PROGRESS NOTE    [name]  PFX:[id number] DOB: 04/11/1933 DOA: 06/22/2020 PCP: [name], MD    Brief Narrative:  86 year old with history of breast cancer, chronic combined heart failure, chronic kidney disease stage IIIb, history of stroke and hypertension diagnosed with COVID-19 about 3 days prior to arrival.  Was a started on antibiotic infusion which she received.  Went home and started having diarrhea felt weak and drowsy so came to the ER. At the emergency room reported orthostatic.  Hemodynamically stable at rest.  Chest x-ray was clear.  Assessment & Plan:   Principal Problem:   Syncope and collapse Active Problems:   Acute on chronic diastolic CHF (congestive heart failure) (HCC)   CKD (chronic kidney disease), stage III (HCC)   LBBB (left bundle branch block)   DM (diabetes mellitus) ([place])   Malignant neoplasm of lower-inner quadrant of right breast of female, estrogen receptor positive ([place])   COVID-19 virus infection   Hypokalemia  Acute syncopal event in the setting of hypotension and dehydration: Treated with IV fluids with improvement.  Diarrhea frequency and amount is improving now.  Continue symptomatic treatment.  COVID-19 infection without respiratory symptoms/intractable diarrhea: Without respiratory symptoms.  Symptomatic management. COVID-19 Labs  No results for input(s): DDIMER, FERRITIN, LDH, CRP in the last 72 hours.  Lab Results  Component Value Date   [place] (A) 06/19/2020   [place] NEGATIVE 08/20/2019   [place] NEGATIVE 08/10/2019   SARSCOV2NAA NOT DETECTED 07/22/2019   Insulin-dependent diabetes mellitus: Poorly controlled at home with A1c of 9. Reported early morning hypoglycemia, will decrease dose of nighttime Lantus.  Hopefully with increased oral intake, her insulin demand will regulate.  Chronic kidney disease stage IIIb: Stable at baseline.  Chronic left bundle branch block: Stable at baseline.  History  of DVT: On Eliquis  Chronic combined heart failure: Euvolemic.  Hypokalemia: Severe.  Replaced aggressively with improvement.  Magnesium improved.   DVT prophylaxis: apixaban (ELIQUIS) tablet 2.5 mg Start: 06/23/20 1000 apixaban (ELIQUIS) tablet 2.5 mg   Code Status: Full code Family Communication: None.  Daughter could not pick up the phone Disposition Plan: Status is: Inpatient  Remains inpatient appropriate because:Inpatient level of care appropriate due to severity of illness   Dispo: The patient is from: Home              Anticipated d/c is to: SNF              Anticipated d/c date is: 1 day              Patient currently is not medically stable to d/c.         Consultants:   None  Procedures:   None  Antimicrobials:   None   Subjective: Patient seen and examined.  Overnight she had 2 loose bowel movements.  Patient thinks they are better than before and not as much as before.  Remains afebrile.  On room air.  Objective: Vitals:   06/23/20 2127 06/24/20 0114 06/24/20 0509 06/24/20 1311  BP: (!) 143/80 134/66 118/62 106/65  Pulse: 66 66 (!) 59 (!) 59  Resp: 18 18 15 15   Temp: 98.9 F (37.2 C) 98.6 F (37 C) 97.7 F (36.5 C) 98.8 F (37.1 C)  TempSrc: Oral Oral Oral Oral  SpO2: 98% 98% 93% 97%  Weight:   91.6 kg     Intake/Output Summary (Last 24 hours) at 06/24/2020 1358 Last data filed at 06/24/2020 0600 Gross per 24 hour  Intake 1767.57 ml  Output --  Net 1767.57 ml   Filed Weights   06/24/20 0509  Weight: 91.6 kg    Examination:  General exam: Appears calm and comfortable  Age-appropriate.  Comfortable on room air. Respiratory system: Clear to auscultation. Respiratory effort normal. Cardiovascular system: S1 & S2 heard, RRR. No JVD, murmurs, rubs, gallops or clicks. No pedal edema. Gastrointestinal system: Abdomen is nondistended, soft and nontender. No organomegaly or masses felt. Normal bowel sounds heard. Central nervous system:  Alert and oriented. No focal neurological deficits. Extremities: Symmetric 5 x 5 power. Skin: No rashes, lesions or ulcers Psychiatry: Judgement and insight appear normal. Mood & affect appropriate.     Data Reviewed: I have personally reviewed following labs and imaging studies  CBC: Recent Labs  Lab 06/22/20 1621 06/23/20 0446 06/24/20 0835  WBC 3.2* 2.4* 2.3*  NEUTROABS 1.8  --  1.1*  HGB 10.5* 10.4* 9.9*  HCT 32.8* 32.8* 31.2*  MCV 96.5 95.9 96.6  PLT 94* 89* 93*   Basic Metabolic Panel: Recent Labs  Lab 06/22/20 1621 06/23/20 0446 06/24/20 0835  NA 140 140 142  K 3.4* 2.9* 3.5  CL 101 102 105  CO2 28 26 26   GLUCOSE 171* 139* 87  BUN 33* 33* 30*  CREATININE 1.78* 1.65* 1.50*  CALCIUM 8.1* 7.9* 8.1*  MG  --   --  1.9   GFR: Estimated Creatinine Clearance: 30.6 mL/min (A) (by C-G formula based on SCr of 1.5 mg/dL (H)). Liver Function Tests: Recent Labs  Lab 06/22/20 1621 06/23/20 0446  AST 65* 55*  ALT 55* 47*  ALKPHOS 64 55  BILITOT 0.4 0.5  PROT 7.2 6.6  ALBUMIN 3.4* 3.1*   Recent Labs  Lab 06/22/20 1621  LIPASE 22   No results for input(s): AMMONIA in the last 168 hours. Coagulation Profile: No results for input(s): INR, PROTIME in the last 168 hours. Cardiac Enzymes: No results for input(s): CKTOTAL, CKMB, CKMBINDEX, TROPONINI in the last 168 hours. BNP (last 3 results) No results for input(s): PROBNP in the last 8760 hours. HbA1C: Recent Labs    06/22/20 1621  HGBA1C 9.1*   CBG: Recent Labs  Lab 06/23/20 1730 06/23/20 2145 06/24/20 0547 06/24/20 0807 06/24/20 1122  GLUCAP 155* 115* 51* 74 148*   Lipid Profile: No results for input(s): CHOL, HDL, LDLCALC, TRIG, CHOLHDL, LDLDIRECT in the last 72 hours. Thyroid Function Tests: No results for input(s): TSH, T4TOTAL, FREET4, T3FREE, THYROIDAB in the last 72 hours. Anemia Panel: No results for input(s): VITAMINB12, FOLATE, FERRITIN, TIBC, IRON, RETICCTPCT in the last 72  hours. Sepsis Labs: Recent Labs  Lab 06/22/20 1621  LATICACIDVEN 1.5    Recent Results (from the past 240 hour(s))  SARS Coronavirus 2 by RT PCR (hospital order, performed in [facility] hospital lab) Nasopharyngeal Nasopharyngeal Swab     Status: Abnormal   Collection Time: 06/19/20 11:50 PM   Specimen: Nasopharyngeal Swab  Result Value Ref Range Status   SARS Coronavirus 2 POSITIVE (A) NEGATIVE Final    Comment: RESULT CALLED TO, READ BACK BY AND VERIFIED WITH: ADKINS,C AT [id number] ON [id number] BY CHERESNOWSKY,T (NOTE) SARS-CoV-2 target nucleic acids are DETECTED  SARS-CoV-2 RNA is generally detectable in upper respiratory specimens  during the acute phase of infection.  Positive results are indicative  of the presence of the identified virus, but do not rule out bacterial infection or co-infection with other pathogens not detected by the test.  Clinical correlation with patient history and  other diagnostic information is necessary to determine patient infection status.  The expected result is negative.  Fact Sheet for Patients:   StrictlyIdeas.no   Fact Sheet for Healthcare Providers:   BankingDealers.co.za    This test is not yet approved or cleared by the [place] FDA and  has been authorized for detection and/or diagnosis of SARS-CoV-2 by FDA under an Emergency Use Authorization (EUA).  This EUA will remain in effect (meani ng this test can be used) for the duration of  the COVID-19 declaration under Section 564(b)(1) of the Act, 21 U.S.C. section 360-bbb-3(b)(1), unless the authorization is terminated or revoked sooner.  Performed at [facility], [address] [place], [place] [address]          Radiology Studies: DG Chest [place] 1 View  Result Date: 06/22/2020 CLINICAL DATA:  COVID positive, weakness EXAM: PORTABLE CHEST 1 VIEW COMPARISON:  06/20/2020 FINDINGS: Chronic mild interstitial prominence.  No new consolidation. No pleural effusion. Stable cardiomediastinal contours. Left chest wall pacemaker. IMPRESSION: No significant change since 06/20/2020. Electronically Signed   By: Macy Mis M.D.   On: 06/22/2020 16:29   ECHOCARDIOGRAM COMPLETE  Result Date: 06/23/2020    ECHOCARDIOGRAM REPORT   Patient Name:   [name] Date of Exam: 06/23/2020 Medical Rec #:  [id number]    Height:       65.0 in Accession #:    [id number]   Weight:       192.0 lb Date of Birth:  07-08-1933    BSA:          1.944 m Patient Age:    60 years     BP:           97/55 mmHg Patient Gender: F            HR:           64 bpm. Exam Location:  Inpatient Procedure: 2D Echo, Cardiac Doppler and Color Doppler Indications:    R55 Syncope  History:        Patient has prior history of Echocardiogram examinations, most                 recent 07/22/2019. CHF, Pacemaker, Stroke, Arrythmias:LBBB; Risk                 Factors:Diabetes. Cancer. CKD. COVID-19 Positive.  Sonographer:    Jonelle Sidle Dance Referring Phys: [place]  1. Left ventricular ejection fraction, by estimation, is 30%. The left ventricle has moderate to severely decreased function. The left ventricle demonstrates global hypokinesis with septal-lateral dyssynchrony. There is severe left ventricular hypertrophy. Left ventricular diastolic parameters are consistent with Grade I diastolic dysfunction (impaired relaxation).  2. Right ventricular systolic function is mildly reduced. The right ventricular size is normal. Tricuspid regurgitation signal is inadequate for assessing PA pressure.  3. Left atrial size was severely dilated.  4. The mitral valve is normal in structure. Trivial mitral valve regurgitation. No evidence of mitral stenosis.  5. The aortic valve is tricuspid. Aortic valve regurgitation is not visualized. Mild aortic valve sclerosis is present, with no evidence of aortic valve stenosis.  6. Aortic dilatation noted. There is mild dilatation  of the aortic root measuring 39 mm.  7. The inferior vena cava is dilated in size with <50% respiratory variability, suggesting right atrial pressure of 15 mmHg. FINDINGS  Left Ventricle: Left ventricular ejection fraction, by estimation, is 30%. The left ventricle has moderate to  severely decreased function. The left ventricle demonstrates global hypokinesis. The left ventricular internal cavity size was normal in size. There is severe left ventricular hypertrophy. Left ventricular diastolic parameters are consistent with Grade I diastolic dysfunction (impaired relaxation). Right Ventricle: The right ventricular size is normal. No increase in right ventricular wall thickness. Right ventricular systolic function is mildly reduced. Tricuspid regurgitation signal is inadequate for assessing PA pressure. Left Atrium: Left atrial size was severely dilated. Right Atrium: Right atrial size was normal in size. Pericardium: Trivial pericardial effusion is present. Mitral Valve: The mitral valve is normal in structure. Trivial mitral valve regurgitation. No evidence of mitral valve stenosis. Tricuspid Valve: The tricuspid valve is normal in structure. Tricuspid valve regurgitation is trivial. Aortic Valve: The aortic valve is tricuspid. Aortic valve regurgitation is not visualized. Mild aortic valve sclerosis is present, with no evidence of aortic valve stenosis. Pulmonic Valve: The pulmonic valve was normal in structure. Pulmonic valve regurgitation is not visualized. Aorta: Aortic dilatation noted. There is mild dilatation of the aortic root measuring 39 mm. Venous: The inferior vena cava is dilated in size with less than 50% respiratory variability, suggesting right atrial pressure of 15 mmHg. IAS/Shunts: No atrial level shunt detected by color flow Doppler.  LEFT VENTRICLE PLAX 2D LVIDd:         5.00 cm  Diastology LVIDs:         3.20 cm  LV e' lateral:   4.35 cm/s LV PW:         1.20 cm  LV E/e' lateral: 11.8 LV IVS:         1.20 cm  LV e' medial:    3.48 cm/s LVOT diam:     2.30 cm  LV E/e' medial:  14.8 LV SV:         74 LV SV Index:   38 LVOT Area:     4.15 cm  RIGHT VENTRICLE            IVC RV Basal diam:  2.70 cm    IVC diam: 2.40 cm RV S prime:     9.36 cm/s TAPSE (M-mode): 1.2 cm LEFT ATRIUM              Index       RIGHT ATRIUM          Index LA diam:        5.40 cm  2.78 cm/m  RA Area:     6.15 cm LA Vol (A2C):   98.9 ml  50.87 ml/m RA Volume:   7.56 ml  3.89 ml/m LA Vol (A4C):   151.0 ml 77.67 ml/m LA Biplane Vol: 125.0 ml 64.29 ml/m  AORTIC VALVE LVOT Vmax:   83.50 cm/s LVOT Vmean:  57.400 cm/s LVOT VTI:    0.177 m  AORTA Ao Root diam: 3.90 cm Ao Asc diam:  3.40 cm MITRAL VALVE MV Area (PHT): 2.69 cm    SHUNTS MV Decel Time: 282 msec    Systemic VTI:  0.18 m MV E velocity: 51.40 cm/s  Systemic Diam: 2.30 cm MV A velocity: 52.80 cm/s MV E/A ratio:  0.97 Loralie Champagne MD Electronically signed by Loralie Champagne MD Signature Date/Time: 06/23/2020/5:51:38 PM    Final         Scheduled Meds: . amiodarone  200 mg Oral BID  . anastrozole  1 mg Oral Daily  . apixaban  2.5 mg Oral BID  . atorvastatin  20 mg Oral QHS  . carvedilol  12.5 mg Oral BID WC  . cholecalciferol  1,000 Units Oral Daily  . famotidine  20 mg Oral Daily  . ferrous sulfate  325 mg Oral Q breakfast  . insulin aspart  0-15 Units Subcutaneous TID WC  . insulin aspart  0-5 Units Subcutaneous QHS  . insulin detemir  25 Units Subcutaneous QHS  . isosorbide-hydrALAZINE  1 tablet Oral TID  . metoprolol succinate  50 mg Oral Daily  . mirabegron ER  50 mg Oral Daily  . mirtazapine  7.5 mg Oral QHS  . oxybutynin  5 mg Oral BID  . pantoprazole  40 mg Oral Daily  . potassium chloride SA  40 mEq Oral Daily  . pregabalin  75 mg Oral QHS  . sodium chloride flush  3 mL Intravenous Q12H  . vitamin B-12  250 mcg Oral Daily   Continuous Infusions: . sodium chloride 100 mL/hr at 06/24/20 1021     LOS: 2 days    Time spent: 30  minutes    [name], MD [facility] Pager [phone]

## 2020-06-24 NOTE — TOC Initial Note (Addendum)
Transition of Care [facility]) - Initial/Assessment Note    Patient Details  Name: [name] MRN: [id number] Date of Birth: Aug 06, 1933  [facility] CM/SW Contact:    Trish Mage, LCSW Phone Number: 06/24/2020, 11:20 AM  Clinical Narrative:   Patient seen in follow up to PT recommendation of [facility], dependent on if home care is able to manage her.  Spoke with patient who stated her preference is to return home; she gave permission to speak with daughter with whom she lives.  Small,[name] (Daughter) [phone], states she is full time care giver for mother, and due to her own medical issues and her mother's increased weakness, she is asking for referral to rehab. Daughter is also caregiver for adult son who has mental health issues, and she is feeling overwhelmed.  Went back to patient to confirm plan after speaking with daughter.  She is agreeable to go to short term rehab.  Bed search inititated. TOC will continue to follow during the course of hospitalization.  Addendum:  [facility] ref. #[id number].                  Expected Discharge Plan: [place] Barriers to Discharge: SNF Pending bed offer   Patient Goals and CMS Choice Patient states their goals for this hospitalization and ongoing recovery are:: "I don't want to [facility] to [facility].gov Compare Post Acute Care list provided to:: Patient Choice offered to / list presented to : Patient, Adult Children  Expected Discharge Plan and Services Expected Discharge Plan: [place]   Discharge Planning Services: CM Consult Post Acute Care Choice: [place] Living arrangements for the past 2 months: Apartment                                      Prior Living Arrangements/Services Living arrangements for the past 2 months: Apartment Lives with:: Adult Children Patient language and need for interpreter reviewed:: Yes        Need for Family Participation in Patient  Care: Yes (Comment) Care giver support system in place?: Yes (comment)   Criminal Activity/Legal Involvement Pertinent to Current Situation/Hospitalization: No - Comment as needed  Activities of Daily Living Home Assistive Devices/Equipment: CBG Meter, Eyeglasses, Bedside commode/3-in-1, Grab bars around toilet, Walker (specify type), Wheelchair, Other (Comment) (2 and 4 wheeled walker, tub bench, manual wheelchair) ADL Screening (condition at time of admission) Patient's cognitive ability adequate to safely complete daily activities?: Yes Is the patient deaf or have difficulty hearing?: No Does the patient have difficulty seeing, even when wearing glasses/contacts?: No Does the patient have difficulty concentrating, remembering, or making decisions?: No Patient able to express need for assistance with ADLs?: Yes Does the patient have difficulty dressing or bathing?: Yes Independently performs ADLs?: No Communication: Independent Dressing (OT): Needs assistance Grooming: Needs assistance Is this a change from baseline?: Pre-admission baseline Feeding: Needs assistance Is this a change from baseline?: Pre-admission baseline Bathing: Needs assistance Is this a change from baseline?: Pre-admission baseline Toileting: Needs assistance Is this a change from baseline?: Pre-admission baseline In/Out Bed: Needs assistance Is this a change from baseline?: Pre-admission baseline Walks in Home: Needs assistance Is this a change from baseline?: Pre-admission baseline Does the patient have difficulty walking or climbing stairs?: Yes (secondary to weakness) Weakness of Legs: Both Weakness of Arms/Hands: None  Permission Sought/Granted Permission sought to share information with :  Family Supports Permission granted to share information with : Yes, Verbal Permission Granted  Share Information with NAME: daughter           Emotional Assessment Appearance:: Appears stated  age Attitude/Demeanor/Rapport: Engaged Affect (typically observed): Appropriate Orientation: : Oriented to Self, Oriented to Place, Oriented to Situation Alcohol / Substance Use: Not Applicable Psych Involvement: No (comment)  Admission diagnosis:  Syncope and collapse [R55] Elevated troponin [R77.8] Pancytopenia ([place]) [D61.818] COVID-19 virus infection [U07.1] Patient Active Problem List   Diagnosis Date Noted  . Syncope and collapse 06/22/2020  . COVID-19 virus infection 06/22/2020  . Hypokalemia 06/22/2020  . Pacemaker 05/06/2020  . Syncope 08/10/2019  . Displaced fracture of lateral malleolus of right fibula, initial encounter for closed fracture 07/10/2019  . Pain in left shoulder 07/10/2019  . Acute CHF (congestive heart failure) ([place]) 06/09/2019  . Acute pulmonary edema (HCC)   . Personal history of breast cancer 04/22/2019  . Pressure ulcer 01/04/2019  . Acute pancreatitis 01/02/2019  . Malignant neoplasm of lower-inner quadrant of right breast of female, estrogen receptor positive ([place]) 11/23/2018  . Hematoma of right iliopsoas muscle 11/06/2018  . Intramuscular hematoma 11/06/2018  . Hypertensive heart disease with heart failure ([place]) 09/05/2017  . Aphasia 08/31/2017  . Dysphagia 08/31/2017  . CKD (chronic kidney disease), stage III ([place])   . LBBB (left bundle branch block)   . Anemia   . HTN (hypertension)   . Obesity   . DVT (deep venous thrombosis) ([place])   . DM (diabetes mellitus) ([place]) 07/28/2015  . Fall 07/28/2015  . Warfarin-induced coagulopathy ([place]) 07/28/2015  . Acute on chronic diastolic CHF (congestive heart failure) ([place]) 03/19/2014  . Edema 08/06/2013  . SVT (supraventricular tachycardia) ([place]) 10/15/2012  . First degree AV block 10/15/2012  . Acute renal insufficiency 10/14/2012  . Dizziness and giddiness   . Thrombocytopenia ([place]) 11/21/2011   PCP:  [name], MD Pharmacy:   [facility] [phone] - [place], [place] - [address]  AT [place] [address] [place] [address] Phone: [phone] Fax: [phone]  [facility] [place] Hjalmar Ballengee, MD - [place] AT [place] [address] Phone: [phone] Fax: [phone]     Social Determinants of Health (SDOH) Interventions    Readmission Risk Interventions Readmission Risk Prevention Plan 01/03/2019 01/02/2019  Transportation Screening (No Data) Complete  PCP or Specialist Appt within 3-5 Days - Complete  HRI or [place] - Complete  Social Work Consult for [place] Planning/Counseling - Complete  Palliative Care Screening - Not Applicable  Medication Review Press photographer) - Complete  Some recent data might be hidden

## 2020-06-24 NOTE — Progress Notes (Signed)
Glucose cbg 51 asymptomatic.  Orange juice/ghrahm crackers given  Will continue to [employer]

## 2020-06-24 NOTE — Evaluation (Signed)
Occupational Therapy Evaluation Patient Details Name: [name] MRN: [id number] DOB: 11-22-1932 Today's Date: 06/24/2020    History of Present Illness Patient is a 86 y.o. female with medical history significant of breast cancer, chronic combined systolic and diastolic CHF, chronic kidney disease stage III, history of CVA, history of left bundle branch block as well as mitral regurgitation, essential hypertension diabetes who was diagnosed with COVID-19 infection 3 days PTA with fever and chills but no respiratory distress.  Patient also was sent to get antibody infusion 06/21/20.   Onset of watery diarrhea after returning home with syncopal episode.   Clinical Impression   [name] is an 88 year old woman admitted to hospital with [facility]. Patient presents on room air with 02 sats at 99%. On evaluation patient demonstrates functional upper body strength in order to perform bed mobility and use of walker with transfer to recliner. Patient supervision for supine to sit. Patient donned socks at side of bed and transferred to recliner with RW and min guard from therapist. Patient able to take hands of walker briefly to demonstrate ability to use hands for toileting but unsteady and nervous with hands off the walker. Patient required set up with food tray. Patient did not report shortness of breath or need oxygen but did report feeling a little tired. Patient presents with decreased activity tolerance and decreased ability to perform baseline mobility and ADLs. Patient will benefit from skilled OT services to improve deficits. Recommend continued [place] services at discharge.    Follow Up Recommendations  Home health OT    Equipment Recommendations  None recommended by OT    Recommendations for Other Services       Precautions / Restrictions Precautions Precautions: Fall Restrictions Weight Bearing Restrictions: No      Mobility Bed Mobility Overal bed mobility: Needs Assistance Bed  Mobility: Supine to Sit     Supine to sit: Supervision     General bed mobility comments: increased time  Transfers Overall transfer level: Needs assistance Equipment used: Rolling walker (2 wheeled) Transfers: Sit to/from [facility] Sit to Stand: Min guard Stand pivot transfers: Min guard            Balance Overall balance assessment: Needs assistance Sitting-balance support: No upper extremity supported;Feet supported Sitting balance-Leahy Scale: Good     Standing balance support: During functional activity Standing balance-Leahy Scale: Fair Standing balance comment: Able to take hands off of walker but unsteady and patient feels unsafe.                           ADL either performed or assessed with clinical judgement   ADL Overall ADL's : Needs assistance/impaired Eating/Feeding: Set up Eating/Feeding Details (indicate cue type and reason): required assistance to open containers. Grooming: Wash/dry face;Sitting;Set up Grooming Details (indicate cue type and reason): washed hands seated in recliner. Upper Body Bathing: Set up;Sitting   Lower Body Bathing: Minimal assistance;Sit to/from stand   Upper Body Dressing : Set up;Sitting   Lower Body Dressing: Min guard Lower Body Dressing Details (indicate cue type and reason): patient donned socks seated at edge of bed Toilet Transfer: Min guard;RW;Stand-pivot;BSC   Toileting- Clothing Manipulation and Hygiene: Minimal assistance;Sit to/from stand       Functional mobility during ADLs: Min guard;Rolling walker       Vision Baseline Vision/History: Wears glasses Patient Visual Report: No change from baseline       Perception  Praxis      Pertinent Vitals/Pain Pain Assessment: No/denies pain     Hand Dominance Right   Extremity/Trunk Assessment Upper Extremity Assessment Upper Extremity Assessment: Overall WFL for tasks assessed   Lower Extremity Assessment Lower  Extremity Assessment: Defer to PT evaluation   Cervical / Trunk Assessment Cervical / Trunk Assessment: Kyphotic   Communication Communication Communication: Expressive difficulties (mild word finding)   Cognition Arousal/Alertness: Awake/alert Behavior During Therapy: WFL for tasks assessed/performed Overall Cognitive Status: Within Functional Limits for tasks assessed                                     General Comments       Exercises     Shoulder Instructions      Home Living Family/patient expects to be discharged to:: Private residence Living Arrangements: Children;Other relatives Available Help at Discharge: Family;Available PRN/intermittently Type of Home: Apartment Home Access: Level entry     Home Layout: One level         Bathroom Toilet: Standard Bathroom Accessibility: Yes How Accessible: Accessible via wheelchair Home Equipment: [place] - 4 wheels;Wheelchair - [employer] - 2 wheels;Bedside commode;Tub bench;Grab bars - toilet   Additional Comments: patient  uses Wc for mod I mobilty, HHPT working on gait with RW. Reports performing toilet transfers w/ mod I.      Prior Functioning/Environment Level of Independence: Needs assistance    ADL's / Homemaking Assistance Needed: Reports being able to perform ADLs from set up seated level. Supervision for tub transfers.            OT Problem List: Decreased activity tolerance;Decreased knowledge of use of DME or AE;Obesity      OT Treatment/Interventions: Self-care/ADL training;Therapeutic activities;DME and/or AE instruction;Patient/family education    OT Goals(Current goals can be found in the care plan section) Acute Rehab OT Goals Patient Stated Goal: to get out of bed OT Goal Formulation: With patient Time For Goal Achievement: 07/08/20 Potential to Achieve Goals: Good  OT Frequency: Min 2X/week   Barriers to D/C:            Co-evaluation              AM-PAC OT  "6 Clicks" Daily Activity     Outcome Measure Help from another person eating meals?: A Little Help from another person taking care of personal grooming?: A Little Help from another person toileting, which includes using toliet, bedpan, or urinal?: A Little Help from another person bathing (including washing, rinsing, drying)?: A Little Help from another person to put on and taking off regular upper body clothing?: A Little Help from another person to put on and taking off regular lower body clothing?: A Little 6 Click Score: 18   End of Session Equipment Utilized During Treatment: Gait belt;Rolling walker Nurse Communication: Mobility status  Activity Tolerance: Patient tolerated treatment well Patient left: in chair;with call bell/phone within reach;with chair alarm set  OT Visit Diagnosis: Unsteadiness on feet (R26.81);Muscle weakness (generalized) (M62.81)                Time: 1660-6301 OT Time Calculation (min): 16 min Charges:  OT General Charges $OT Visit: 1 Visit OT Evaluation $OT Eval Low Complexity: 1 Low  Kery Batzel, OTR/L [place]  Office [phone] Pager: [place] 06/24/2020, 12:24 PM

## 2020-06-24 NOTE — NC FL2 (Signed)
[place] MEDICAID FL2 LEVEL OF CARE SCREENING TOOL     IDENTIFICATION  Patient Name: [name] Birthdate: Jun 14, 1933 Sex: female Admission Date (Current Location): 06/22/2020  [facility] and [place] Number:  Herbalist and Address:  [facility],  [place] [address] [place]      Provider Number: [id number]  Attending Physician Name and Address:  [name], MD  Relative Name and Phone Number:  [name] (Daughter) [phone]    Current Level of Care: Hospital Recommended Level of Care: [place] Prior Approval Number:    Date Approved/Denied:   PASRR Number: [id number]  Discharge Plan: SNF    Current Diagnoses: Patient Active Problem List   Diagnosis Date Noted  . Syncope and collapse 06/22/2020  . COVID-19 virus infection 06/22/2020  . Hypokalemia 06/22/2020  . Pacemaker 05/06/2020  . Syncope 08/10/2019  . Displaced fracture of lateral malleolus of right fibula, initial encounter for closed fracture 07/10/2019  . Pain in left shoulder 07/10/2019  . Acute CHF (congestive heart failure) ([place]) 06/09/2019  . Acute pulmonary edema (HCC)   . Personal history of breast cancer 04/22/2019  . Pressure ulcer 01/04/2019  . Acute pancreatitis 01/02/2019  . Malignant neoplasm of lower-inner quadrant of right breast of female, estrogen receptor positive ([place]) 11/23/2018  . Hematoma of right iliopsoas muscle 11/06/2018  . Intramuscular hematoma 11/06/2018  . Hypertensive heart disease with heart failure ([place]) 09/05/2017  . Aphasia 08/31/2017  . Dysphagia 08/31/2017  . CKD (chronic kidney disease), stage III ([place])   . LBBB (left bundle branch block)   . Anemia   . HTN (hypertension)   . Obesity   . DVT (deep venous thrombosis) ([place])   . DM (diabetes mellitus) ([place]) 07/28/2015  . Fall 07/28/2015  . Warfarin-induced coagulopathy ([place]) 07/28/2015  . Acute on chronic diastolic CHF (congestive heart failure) ([place])  03/19/2014  . Edema 08/06/2013  . SVT (supraventricular tachycardia) ([place]) 10/15/2012  . First degree AV block 10/15/2012  . Acute renal insufficiency 10/14/2012  . Dizziness and giddiness   . Thrombocytopenia ([place]) 11/21/2011    Orientation RESPIRATION BLADDER Height & Weight     Self, Situation, Place  Normal External catheter Weight: 91.6 kg Height:     BEHAVIORAL SYMPTOMS/MOOD NEUROLOGICAL BOWEL NUTRITION STATUS   (none)  (none) Continent Diet (see d/c summary)  AMBULATORY STATUS COMMUNICATION OF NEEDS Skin   Extensive Assist Verbally Normal                       Personal Care Assistance Level of Assistance  Bathing, Feeding, Dressing Bathing Assistance: Maximum assistance Feeding assistance: Independent Dressing Assistance: Limited assistance     Functional Limitations Info  Sight, Hearing, Speech Sight Info: Adequate Hearing Info: Adequate Speech Info: Adequate    SPECIAL CARE FACTORS FREQUENCY   (none)                    Contractures Contractures Info: Not present    Additional Factors Info  Code Status, Allergies Code Status Info: full Allergies Info: penicillins           Current Medications (06/24/2020):  This is the current hospital active medication list Current Facility-Administered Medications  Medication Dose Route Frequency Provider Last Rate Last Admin  . 0.45 % sodium chloride infusion   Intravenous Continuous Elwyn Reach, MD 100 mL/hr at 06/24/20 1021 New Bag at 06/24/20 1021  . acetaminophen (TYLENOL) tablet 650 mg  650 mg  Oral Q6H PRN Elwyn Reach, MD       Or  . acetaminophen (TYLENOL) suppository 650 mg  650 mg Rectal Q6H PRN Elwyn Reach, MD      . amiodarone (PACERONE) tablet 200 mg  200 mg Oral BID Gala Romney L, MD   200 mg at 06/24/20 0917  . anastrozole (ARIMIDEX) tablet 1 mg  1 mg Oral Daily Elwyn Reach, MD   1 mg at 06/24/20 0918  . apixaban (ELIQUIS) tablet 2.5 mg  2.5 mg Oral BID Gala Romney L, MD   2.5 mg at 06/24/20 0915  . atorvastatin (LIPITOR) tablet 20 mg  20 mg Oral QHS Elwyn Reach, MD   20 mg at 06/23/20 2238  . carvedilol (COREG) tablet 12.5 mg  12.5 mg Oral BID WC Gala Romney L, MD   12.5 mg at 06/24/20 0847  . cholecalciferol (VITAMIN D) tablet 1,000 Units  1,000 Units Oral Daily Elwyn Reach, MD   1,000 Units at 06/24/20 0916  . diclofenac sodium (VOLTAREN) 1 % transdermal gel 2 g  2 g Topical QID PRN Elwyn Reach, MD      . famotidine (PEPCID) tablet 20 mg  20 mg Oral Daily Little Ishikawa, MD   20 mg at 06/24/20 0915  . ferrous sulfate tablet 325 mg  325 mg Oral Q breakfast Elwyn Reach, MD   325 mg at 06/24/20 0847  . insulin aspart (novoLOG) injection 0-15 Units  0-15 Units Subcutaneous TID WC Elwyn Reach, MD   2 Units at 06/24/20 1204  . insulin aspart (novoLOG) injection 0-5 Units  0-5 Units Subcutaneous QHS Garba, Mohammad L, MD      . insulin detemir (LEVEMIR) injection 30 Units  30 Units Subcutaneous QHS Elwyn Reach, MD   30 Units at 06/23/20 2239  . isosorbide-hydrALAZINE (BIDIL) 20-37.5 MG per tablet 1 tablet  1 tablet Oral TID Elwyn Reach, MD   1 tablet at 06/24/20 0917  . metoprolol succinate (TOPROL-XL) 24 hr tablet 50 mg  50 mg Oral Daily Elwyn Reach, MD   50 mg at 06/24/20 0919  . mirabegron ER (MYRBETRIQ) tablet 50 mg  50 mg Oral Daily Gala Romney L, MD   50 mg at 06/24/20 0918  . mirtazapine (REMERON) tablet 7.5 mg  7.5 mg Oral QHS Gala Romney L, MD   7.5 mg at 06/23/20 2238  . ondansetron (ZOFRAN) tablet 4 mg  4 mg Oral Q6H PRN Elwyn Reach, MD       Or  . ondansetron (ZOFRAN) injection 4 mg  4 mg Intravenous Q6H PRN Elwyn Reach, MD      . oxybutynin (DITROPAN) tablet 5 mg  5 mg Oral BID Elwyn Reach, MD   5 mg at 06/24/20 0917  . pantoprazole (PROTONIX) EC tablet 40 mg  40 mg Oral Daily Elwyn Reach, MD   40 mg at 06/24/20 0917  . potassium chloride SA (KLOR-CON)  CR tablet 40 mEq  40 mEq Oral Daily Elwyn Reach, MD   40 mEq at 06/24/20 0911  . pregabalin (LYRICA) capsule 75 mg  75 mg Oral QHS Elwyn Reach, MD   75 mg at 06/23/20 2238  . sodium chloride flush (NS) 0.9 % injection 3 mL  3 mL Intravenous Q12H Elwyn Reach, MD   3 mL at 06/23/20 0951  . traMADol (ULTRAM) tablet 50 mg  50 mg Oral Q12H PRN Jonelle Sidle,  Henderson Newcomer, MD      . vitamin B-12 (CYANOCOBALAMIN) tablet 250 mcg  250 mcg Oral Daily Elwyn Reach, MD   250 mcg at 06/24/20 0712     Discharge Medications: Please see discharge summary for a list of discharge medications.  Relevant Imaging Results:  Relevant Lab Results:   Additional Information SS# [id number]  [place], [place]

## 2020-06-25 ENCOUNTER — Inpatient Hospital Stay (HOSPITAL_COMMUNITY): Payer: Medicare Other

## 2020-06-25 LAB — GLUCOSE, CAPILLARY
Glucose-Capillary: 94 mg/dL (ref 70–99)
Glucose-Capillary: 95 mg/dL (ref 70–99)
Glucose-Capillary: 128 mg/dL — ABNORMAL HIGH (ref 70–99)
Glucose-Capillary: 86 mg/dL (ref 70–99)
Glucose-Capillary: 79 mg/dL (ref 70–99)

## 2020-06-25 LAB — D-DIMER, QUANTITATIVE: D-Dimer, Quant: 1.81 ug/mL-FEU — ABNORMAL HIGH (ref 0.00–0.50)

## 2020-06-25 MED ORDER — SODIUM CHLORIDE 0.9 % IV SOLN
100.0000 mg | Freq: Every day | INTRAVENOUS | Status: AC
  Administered 2020-06-26 – 2020-06-29 (×4): 100 mg via INTRAVENOUS
  Filled 2020-06-25 (×4): qty 20

## 2020-06-25 MED ORDER — METHYLPREDNISOLONE SODIUM SUCC 40 MG IJ SOLR
40.0000 mg | Freq: Two times a day (BID) | INTRAMUSCULAR | Status: DC
  Administered 2020-06-25 – 2020-06-30 (×10): 40 mg via INTRAVENOUS
  Filled 2020-06-25 (×10): qty 1

## 2020-06-25 MED ORDER — GUAIFENESIN 100 MG/5ML PO SOLN
5.0000 mL | ORAL | Status: DC | PRN
  Administered 2020-06-25 – 2020-07-01 (×13): 100 mg via ORAL
  Filled 2020-06-25 (×6): qty 10
  Filled 2020-06-25: qty 50
  Filled 2020-06-25 (×8): qty 10

## 2020-06-25 MED ORDER — SODIUM CHLORIDE 0.9 % IV SOLN
200.0000 mg | Freq: Once | INTRAVENOUS | Status: AC
  Administered 2020-06-25: 200 mg via INTRAVENOUS
  Filled 2020-06-25: qty 200

## 2020-06-25 NOTE — TOC Progression Note (Signed)
Transition of Care [facility]) - Progression Note    Patient Details  Name: [name] MRN: [id number] Date of Birth: 02-10-1933  Transition of Care [facility]) CM/SW [place], [place] Phone Number: 06/25/2020, 2:02 PM  Clinical Narrative:  Everlene Balls authorized placement at [facility]. Reference # [id number]. Good for 6 days through 9/7. Reviewer is Lorel Monaco. FAX [phone]. Spoke with daughter who made sure both needed meds were called into pharmacy-the [place] needed a refill from PCP.  She will pick them up.  I instructed her to await call from [place] at facility for instructions on where and when to bring them.  Will plan on transfer tomorrow if patient is medically stable and med hand off has taken place. TOC will continue to follow during the course of hospitalization.       Expected Discharge Plan: Skilled Nursing Facility Barriers to Discharge: SNF Pending bed offer  Expected Discharge Plan and Services Expected Discharge Plan: [place]   Discharge Planning Services: CM Consult Post Acute Care Choice: [place] Living arrangements for the past 2 months: Apartment                                       Social Determinants of Health (SDOH) Interventions    Readmission Risk Interventions Readmission Risk Prevention Plan 01/03/2019 01/02/2019  Transportation Screening (No Data) Complete  PCP or Specialist Appt within 3-5 Days - Complete  HRI or [place] - Complete  Social Work Consult for [place] Planning/Counseling - Complete  Palliative Care Screening - Not Applicable  Medication Review Press photographer) - Complete  Some recent data might be hidden

## 2020-06-25 NOTE — Progress Notes (Signed)
This patient is developing more pulmonary symptoms today.  She had no pulmonary symptoms hence was not started on treatment on admission.  Repeat x-ray shows evidence of pneumonia.  We will treat her with remdesivir and steroids for COVID-19 associated pneumonia.

## 2020-06-25 NOTE — Progress Notes (Signed)
PROGRESS NOTE    [name]  DZH:[id number] DOB: Nov 11, 1932 DOA: 06/22/2020 PCP: [name], MD    Brief Narrative:  86 year old with history of breast cancer, chronic combined heart failure, chronic kidney disease stage IIIb, history of stroke and hypertension diagnosed with COVID-19 about 3 days prior to arrival.  Was started on antibody infusion which she received.  Went home and started having diarrhea felt weak and drowsy so came to the ER. At the emergency room reported orthostatic.  Hemodynamically stable at rest.  Chest x-ray was clear.  Assessment & Plan:   Principal Problem:   Syncope and collapse Active Problems:   Acute on chronic diastolic CHF (congestive heart failure) (HCC)   CKD (chronic kidney disease), stage III (HCC)   LBBB (left bundle branch block)   DM (diabetes mellitus) ([place])   Malignant neoplasm of lower-inner quadrant of right breast of female, estrogen receptor positive ([place])   COVID-19 virus infection   Hypokalemia  Acute syncopal event in the setting of hypotension and dehydration: Treated with IV fluids with improvement.  Diarrhea frequency and amount is improving now.  Continue symptomatic treatment.  COVID-19 infection without respiratory symptoms/intractable diarrhea: Without minimal respiratory symptoms.  Symptomatic management. Patient has more cough and congestion today.  Start mucolytic's and chest physiotherapy. COVID-19 Labs  No results for input(s): DDIMER, FERRITIN, LDH, CRP in the last 72 hours.  Lab Results  Component Value Date   [place] (A) 06/19/2020   [place] NEGATIVE 08/20/2019   [place] NEGATIVE 08/10/2019   SARSCOV2NAA NOT DETECTED 07/22/2019   Insulin-dependent diabetes mellitus: Poorly controlled at home with A1c of 9. Reported early morning hypoglycemia 9/1 so on reduced dose of insulin. Better today.  Chronic kidney disease stage IIIb: Stable at baseline.  Chronic left bundle branch block:  Stable at baseline.  History of DVT: On Eliquis  Chronic combined heart failure: Euvolemic.  Hypokalemia: Severe.  Replaced aggressively with improvement.  Magnesium improved.   DVT prophylaxis: apixaban (ELIQUIS) tablet 2.5 mg Start: 06/23/20 1000 apixaban (ELIQUIS) tablet 2.5 mg   Code Status: Full code Family Communication: None.  We will try to connect to patient's daughter. Disposition Plan: Status is: Inpatient  Remains inpatient appropriate because:Inpatient level of care appropriate due to severity of illness   Dispo: The patient is from: Home              Anticipated d/c is to: SNF              Anticipated d/c date is: 1 day              Patient currently is not medically stable to d/c.         Consultants:   None  Procedures:   None  Antimicrobials:   None   Subjective: Seen and examined.  More anxious today.  2 loose bowel movements overnight.  Denies any nausea vomiting.  She has more cough and congestion today.  Objective: Vitals:   06/24/20 2056 06/25/20 0500 06/25/20 0521 06/25/20 1321  BP: 133/67  135/68 125/64  Pulse: 61  68 63  Resp: 18  18 16   Temp: 97.9 F (36.6 C)  98.2 F (36.8 C) 98.1 F (36.7 C)  TempSrc:      SpO2: 99%  100% 100%  Weight:  91.7 kg      Intake/Output Summary (Last 24 hours) at 06/25/2020 1346 Last data filed at 06/25/2020 1052 Gross per 24 hour  Intake 832 ml  Output 400 ml  Net 432 ml   Filed Weights   06/24/20 0509 06/25/20 0500  Weight: 91.6 kg 91.7 kg    Examination:  General exam: Appears calm and comfortable  Looks anxious. Respiratory system: Clear to auscultation. Respiratory effort normal.  Some conducted airway sounds. Cardiovascular system: S1 & S2 heard, RRR. No JVD, murmurs, rubs, gallops or clicks. No pedal edema. Gastrointestinal system: Abdomen is nondistended, soft and nontender. No organomegaly or masses felt. Normal bowel sounds heard. Central nervous system: Alert and oriented. No  focal neurological deficits. Extremities: Symmetric 5 x 5 power. Skin: No rashes, lesions or ulcers Psychiatry: Judgement and insight appear normal. Mood & affect anxious.    Data Reviewed: I have personally reviewed following labs and imaging studies  CBC: Recent Labs  Lab 06/22/20 1621 06/23/20 0446 06/24/20 0835  WBC 3.2* 2.4* 2.3*  NEUTROABS 1.8  --  1.1*  HGB 10.5* 10.4* 9.9*  HCT 32.8* 32.8* 31.2*  MCV 96.5 95.9 96.6  PLT 94* 89* 93*   Basic Metabolic Panel: Recent Labs  Lab 06/22/20 1621 06/23/20 0446 06/24/20 0835  NA 140 140 142  K 3.4* 2.9* 3.5  CL 101 102 105  CO2 28 26 26   GLUCOSE 171* 139* 87  BUN 33* 33* 30*  CREATININE 1.78* 1.65* 1.50*  CALCIUM 8.1* 7.9* 8.1*  MG  --   --  1.9   GFR: Estimated Creatinine Clearance: 30.7 mL/min (A) (by C-G formula based on SCr of 1.5 mg/dL (H)). Liver Function Tests: Recent Labs  Lab 06/22/20 1621 06/23/20 0446  AST 65* 55*  ALT 55* 47*  ALKPHOS 64 55  BILITOT 0.4 0.5  PROT 7.2 6.6  ALBUMIN 3.4* 3.1*   Recent Labs  Lab 06/22/20 1621  LIPASE 22   No results for input(s): AMMONIA in the last 168 hours. Coagulation Profile: No results for input(s): INR, PROTIME in the last 168 hours. Cardiac Enzymes: No results for input(s): CKTOTAL, CKMB, CKMBINDEX, TROPONINI in the last 168 hours. BNP (last 3 results) No results for input(s): PROBNP in the last 8760 hours. HbA1C: Recent Labs    06/22/20 1621  HGBA1C 9.1*   CBG: Recent Labs  Lab 06/24/20 1618 06/24/20 2055 06/25/20 0651 06/25/20 0801 06/25/20 1121  GLUCAP 122* 136* 94 95 128*   Lipid Profile: No results for input(s): CHOL, HDL, LDLCALC, TRIG, CHOLHDL, LDLDIRECT in the last 72 hours. Thyroid Function Tests: No results for input(s): TSH, T4TOTAL, FREET4, T3FREE, THYROIDAB in the last 72 hours. Anemia Panel: No results for input(s): VITAMINB12, FOLATE, FERRITIN, TIBC, IRON, RETICCTPCT in the last 72 hours. Sepsis Labs: Recent Labs  Lab  06/22/20 1621  LATICACIDVEN 1.5    Recent Results (from the past 240 hour(s))  SARS Coronavirus 2 by RT PCR (hospital order, performed in [facility] hospital lab) Nasopharyngeal Nasopharyngeal Swab     Status: Abnormal   Collection Time: 06/19/20 11:50 PM   Specimen: Nasopharyngeal Swab  Result Value Ref Range Status   SARS Coronavirus 2 POSITIVE (A) NEGATIVE Final    Comment: RESULT CALLED TO, READ BACK BY AND VERIFIED WITH: ADKINS,C AT [id number] ON [id number] BY CHERESNOWSKY,T (NOTE) SARS-CoV-2 target nucleic acids are DETECTED  SARS-CoV-2 RNA is generally detectable in upper respiratory specimens  during the acute phase of infection.  Positive results are indicative  of the presence of the identified virus, but do not rule out bacterial infection or co-infection with other pathogens not detected by the test.  Clinical correlation with patient history and  other diagnostic information  is necessary to determine patient infection status.  The expected result is negative.  Fact Sheet for Patients:   StrictlyIdeas.no   Fact Sheet for Healthcare Providers:   BankingDealers.co.za    This test is not yet approved or cleared by the [place] FDA and  has been authorized for detection and/or diagnosis of SARS-CoV-2 by FDA under an Emergency Use Authorization (EUA).  This EUA will remain in effect (meani ng this test can be used) for the duration of  the COVID-19 declaration under Section 564(b)(1) of the Act, 21 U.S.C. section 360-bbb-3(b)(1), unless the authorization is terminated or revoked sooner.  Performed at [facility], [address] [place], [place] [address]          Radiology Studies: ECHOCARDIOGRAM COMPLETE  Result Date: 06/23/2020    ECHOCARDIOGRAM REPORT   Patient Name:   Jeslie JADESOLA POYNTER Date of Exam: 06/23/2020 Medical Rec #:  [id number]    Height:       65.0 in Accession #:    [id number]   Weight:       192.0  lb Date of Birth:  [birth year]    BSA:          1.944 m Patient Age:    72 years     BP:           97/55 mmHg Patient Gender: F            HR:           64 bpm. Exam Location:  Inpatient Procedure: 2D Echo, Cardiac Doppler and Color Doppler Indications:    R55 Syncope  History:        Patient has prior history of Echocardiogram examinations, most                 recent 07/22/2019. CHF, Pacemaker, Stroke, Arrythmias:LBBB; Risk                 Factors:Diabetes. Cancer. CKD. COVID-19 Positive.  Sonographer:    Jonelle Sidle Dance Referring Phys: [place]  1. Left ventricular ejection fraction, by estimation, is 30%. The left ventricle has moderate to severely decreased function. The left ventricle demonstrates global hypokinesis with septal-lateral dyssynchrony. There is severe left ventricular hypertrophy. Left ventricular diastolic parameters are consistent with Grade I diastolic dysfunction (impaired relaxation).  2. Right ventricular systolic function is mildly reduced. The right ventricular size is normal. Tricuspid regurgitation signal is inadequate for assessing PA pressure.  3. Left atrial size was severely dilated.  4. The mitral valve is normal in structure. Trivial mitral valve regurgitation. No evidence of mitral stenosis.  5. The aortic valve is tricuspid. Aortic valve regurgitation is not visualized. Mild aortic valve sclerosis is present, with no evidence of aortic valve stenosis.  6. Aortic dilatation noted. There is mild dilatation of the aortic root measuring 39 mm.  7. The inferior vena cava is dilated in size with <50% respiratory variability, suggesting right atrial pressure of 15 mmHg. FINDINGS  Left Ventricle: Left ventricular ejection fraction, by estimation, is 30%. The left ventricle has moderate to severely decreased function. The left ventricle demonstrates global hypokinesis. The left ventricular internal cavity size was normal in size. There is severe left ventricular  hypertrophy. Left ventricular diastolic parameters are consistent with Grade I diastolic dysfunction (impaired relaxation). Right Ventricle: The right ventricular size is normal. No increase in right ventricular wall thickness. Right ventricular systolic function is mildly reduced. Tricuspid regurgitation signal is inadequate for  assessing PA pressure. Left Atrium: Left atrial size was severely dilated. Right Atrium: Right atrial size was normal in size. Pericardium: Trivial pericardial effusion is present. Mitral Valve: The mitral valve is normal in structure. Trivial mitral valve regurgitation. No evidence of mitral valve stenosis. Tricuspid Valve: The tricuspid valve is normal in structure. Tricuspid valve regurgitation is trivial. Aortic Valve: The aortic valve is tricuspid. Aortic valve regurgitation is not visualized. Mild aortic valve sclerosis is present, with no evidence of aortic valve stenosis. Pulmonic Valve: The pulmonic valve was normal in structure. Pulmonic valve regurgitation is not visualized. Aorta: Aortic dilatation noted. There is mild dilatation of the aortic root measuring 39 mm. Venous: The inferior vena cava is dilated in size with less than 50% respiratory variability, suggesting right atrial pressure of 15 mmHg. IAS/Shunts: No atrial level shunt detected by color flow Doppler.  LEFT VENTRICLE PLAX 2D LVIDd:         5.00 cm  Diastology LVIDs:         3.20 cm  LV e' lateral:   4.35 cm/s LV PW:         1.20 cm  LV E/e' lateral: 11.8 LV IVS:        1.20 cm  LV e' medial:    3.48 cm/s LVOT diam:     2.30 cm  LV E/e' medial:  14.8 LV SV:         74 LV SV Index:   38 LVOT Area:     4.15 cm  RIGHT VENTRICLE            IVC RV Basal diam:  2.70 cm    IVC diam: 2.40 cm RV S prime:     9.36 cm/s TAPSE (M-mode): 1.2 cm LEFT ATRIUM              Index       RIGHT ATRIUM          Index LA diam:        5.40 cm  2.78 cm/m  RA Area:     6.15 cm LA Vol (A2C):   98.9 ml  50.87 ml/m RA Volume:   7.56 ml   3.89 ml/m LA Vol (A4C):   151.0 ml 77.67 ml/m LA Biplane Vol: 125.0 ml 64.29 ml/m  AORTIC VALVE LVOT Vmax:   83.50 cm/s LVOT Vmean:  57.400 cm/s LVOT VTI:    0.177 m  AORTA Ao Root diam: 3.90 cm Ao Asc diam:  3.40 cm MITRAL VALVE MV Area (PHT): 2.69 cm    SHUNTS MV Decel Time: 282 msec    Systemic VTI:  0.18 m MV E velocity: 51.40 cm/s  Systemic Diam: 2.30 cm MV A velocity: 52.80 cm/s MV E/A ratio:  0.97 Loralie Champagne MD Electronically signed by Loralie Champagne MD Signature Date/Time: 06/23/2020/5:51:38 PM    Final         Scheduled Meds: . amiodarone  200 mg Oral BID  . anastrozole  1 mg Oral Daily  . apixaban  2.5 mg Oral BID  . atorvastatin  20 mg Oral QHS  . carvedilol  12.5 mg Oral BID WC  . cholecalciferol  1,000 Units Oral Daily  . famotidine  20 mg Oral Daily  . ferrous sulfate  325 mg Oral Q breakfast  . insulin aspart  0-15 Units Subcutaneous TID WC  . insulin aspart  0-5 Units Subcutaneous QHS  . insulin detemir  25 Units Subcutaneous QHS  . isosorbide-hydrALAZINE  1 tablet Oral  TID  . metoprolol succinate  50 mg Oral Daily  . mirabegron ER  50 mg Oral Daily  . mirtazapine  7.5 mg Oral QHS  . oxybutynin  5 mg Oral BID  . pantoprazole  40 mg Oral Daily  . potassium chloride SA  40 mEq Oral Daily  . pregabalin  75 mg Oral QHS  . sodium chloride flush  3 mL Intravenous Q12H  . vitamin B-12  250 mcg Oral Daily   Continuous Infusions:    LOS: 3 days    Time spent: 30 minutes    [name], MD [facility] Pager [phone]

## 2020-06-25 NOTE — Care Management Important Message (Signed)
Important Message  Patient Details IM Letter given to the Patient Name: [name] MRN: [id number] Date of Birth: Dec 21, 1932   Medicare Important Message Given:  Yes     Kerin Salen 06/25/2020, 10:41 AM

## 2020-06-25 NOTE — Progress Notes (Signed)
Physical Therapy Treatment Patient Details Name: [name] MRN: [id number] DOB: Oct 24, 1933 Today's Date: 06/25/2020    History of Present Illness Patient is a 86 y.o. female with medical history significant of breast cancer, chronic combined systolic and diastolic CHF, chronic kidney disease stage III, history of CVA, history of left bundle branch block as well as mitral regurgitation, essential hypertension diabetes who was diagnosed with COVID-19 infection 3 days PTA with fever and chills but no respiratory distress.  Patient also was sent to get antibody infusion 06/21/20.   Onset of watery diarrhea after returning home with syncopal episode.    PT Comments    The patient is very participatory, able to mobilize and transfer with min assistance for safety, extra time. Patient is not functioning at her baseline of mod Independent transfers and will benefit from post acute rehab.  patient on RA  At 95%  Follow Up Recommendations  SNF     Equipment Recommendations  None recommended by PT    Recommendations for Other Services       Precautions / Restrictions Precautions Precautions: Fall    Mobility  Bed Mobility   Bed Mobility: Supine to Sit     Supine to sit: Supervision     General bed mobility comments: increased time  Transfers Overall transfer level: Needs assistance Equipment used: 1 person hand held assist Transfers: Sit to/from Stand;Stand Pivot Transfers Sit to Stand: Min assist Stand pivot transfers: Min assist       General transfer comment: Assisted to stabnd and transfe to recliner then to and from [facility], extra time, patient steadies self on armrests  Ambulation/Gait                 Stairs             Wheelchair Mobility    Modified Rankin (Stroke Patients Only)       Balance Overall balance assessment: Needs assistance Sitting-balance support: No upper extremity supported;Feet supported Sitting balance-Leahy Scale: Good      Standing balance support: During functional activity;Bilateral upper extremity supported Standing balance-Leahy Scale: Fair                              Cognition Arousal/Alertness: Awake/alert Behavior During Therapy: WFL for tasks assessed/performed                                          Exercises      General Comments        Pertinent Vitals/Pain Pain Assessment: No/denies pain    Home Living                      Prior Function            PT Goals (current goals can now be found in the care plan section) Progress towards PT goals: Progressing toward goals    Frequency    Min 2X/week      PT Plan Discharge plan needs to be updated;Frequency needs to be updated    Co-evaluation              AM-PAC PT "6 Clicks" Mobility   Outcome Measure  Help needed turning from your back to your side while in a flat bed without using bedrails?: A Little Help needed moving from lying on your  back to sitting on the side of a flat bed without using bedrails?: A Little Help needed moving to and from a bed to a chair (including a wheelchair)?: A Little Help needed standing up from a chair using your arms (e.g., wheelchair or bedside chair)?: A Little Help needed to walk in hospital room?: Total Help needed climbing 3-5 steps with a railing? : Total 6 Click Score: 14    End of Session   Activity Tolerance: Treatment limited secondary to medical complications (Comment) Patient left: with call bell/phone within reach;in chair;with chair alarm set Nurse Communication: Mobility status PT Visit Diagnosis: Unsteadiness on feet (R26.81);Hemiplegia and hemiparesis Hemiplegia - Right/Left: Right Hemiplegia - dominant/non-dominant: Dominant Hemiplegia - caused by: Other cerebrovascular disease     Time: 1000-1033 PT Time Calculation (min) (ACUTE ONLY): 33 min  Charges:  $Therapeutic Activity: 23-37 mins                     Tresa Endo PT Acute Rehabilitation Services Pager [phone] Office [phone]    Claretha Cooper 06/25/2020, 12:32 PM

## 2020-06-26 ENCOUNTER — Telehealth: Payer: Medicare Other

## 2020-06-26 DIAGNOSIS — N183 Chronic kidney disease, stage 3 unspecified: Secondary | ICD-10-CM | POA: Diagnosis not present

## 2020-06-26 DIAGNOSIS — E1165 Type 2 diabetes mellitus with hyperglycemia: Secondary | ICD-10-CM | POA: Diagnosis not present

## 2020-06-26 DIAGNOSIS — I69351 Hemiplegia and hemiparesis following cerebral infarction affecting right dominant side: Secondary | ICD-10-CM | POA: Diagnosis not present

## 2020-06-26 DIAGNOSIS — I130 Hypertensive heart and chronic kidney disease with heart failure and stage 1 through stage 4 chronic kidney disease, or unspecified chronic kidney disease: Secondary | ICD-10-CM | POA: Diagnosis not present

## 2020-06-26 LAB — CBC WITH DIFFERENTIAL/PLATELET
Abs Immature Granulocytes: 0 10*3/uL (ref 0.00–0.07)
Basophils Absolute: 0 10*3/uL (ref 0.0–0.1)
Basophils Relative: 0 %
Eosinophils Absolute: 0 10*3/uL (ref 0.0–0.5)
Eosinophils Relative: 1 %
HCT: 31.4 % — ABNORMAL LOW (ref 36.0–46.0)
Hemoglobin: 9.6 g/dL — ABNORMAL LOW (ref 12.0–15.0)
Immature Granulocytes: 0 %
Lymphocytes Relative: 26 %
Lymphs Abs: 0.9 10*3/uL (ref 0.7–4.0)
MCH: 30.4 pg (ref 26.0–34.0)
MCHC: 30.6 g/dL (ref 30.0–36.0)
MCV: 99.4 fL (ref 80.0–100.0)
Monocytes Absolute: 0.3 10*3/uL (ref 0.1–1.0)
Monocytes Relative: 8 %
Neutro Abs: 2.3 10*3/uL (ref 1.7–7.7)
Neutrophils Relative %: 65 %
Platelets: 118 10*3/uL — ABNORMAL LOW (ref 150–400)
RBC: 3.16 MIL/uL — ABNORMAL LOW (ref 3.87–5.11)
RDW: 14.2 % (ref 11.5–15.5)
WBC: 3.5 10*3/uL — ABNORMAL LOW (ref 4.0–10.5)
nRBC: 0 % (ref 0.0–0.2)

## 2020-06-26 LAB — COMPREHENSIVE METABOLIC PANEL
ALT: 29 U/L (ref 0–44)
AST: 27 U/L (ref 15–41)
Albumin: 2.8 g/dL — ABNORMAL LOW (ref 3.5–5.0)
Alkaline Phosphatase: 52 U/L (ref 38–126)
Anion gap: 8 (ref 5–15)
BUN: 18 mg/dL (ref 8–23)
CO2: 23 mmol/L (ref 22–32)
Calcium: 8.1 mg/dL — ABNORMAL LOW (ref 8.9–10.3)
Chloride: 110 mmol/L (ref 98–111)
Creatinine, Ser: 1.29 mg/dL — ABNORMAL HIGH (ref 0.44–1.00)
GFR calc Af Amer: 44 mL/min — ABNORMAL LOW (ref >60)
GFR calc non Af Amer: 38 mL/min — ABNORMAL LOW (ref >60)
Glucose, Bld: 251 mg/dL — ABNORMAL HIGH (ref 70–99)
Potassium: 4.8 mmol/L (ref 3.5–5.1)
Sodium: 141 mmol/L (ref 135–145)
Total Bilirubin: 0.3 mg/dL (ref 0.3–1.2)
Total Protein: 6.1 g/dL — ABNORMAL LOW (ref 6.5–8.1)

## 2020-06-26 LAB — GLUCOSE, CAPILLARY
Glucose-Capillary: 245 mg/dL — ABNORMAL HIGH (ref 70–99)
Glucose-Capillary: 200 mg/dL — ABNORMAL HIGH (ref 70–99)
Glucose-Capillary: 349 mg/dL — ABNORMAL HIGH (ref 70–99)
Glucose-Capillary: 337 mg/dL — ABNORMAL HIGH (ref 70–99)
Glucose-Capillary: 436 mg/dL — ABNORMAL HIGH (ref 70–99)

## 2020-06-26 LAB — PHOSPHORUS: Phosphorus: 3.2 mg/dL (ref 2.5–4.6)

## 2020-06-26 LAB — MAGNESIUM: Magnesium: 2 mg/dL (ref 1.7–2.4)

## 2020-06-26 LAB — C-REACTIVE PROTEIN: CRP: 0.6 mg/dL (ref <1.0)

## 2020-06-26 LAB — FERRITIN: Ferritin: 185 ng/mL (ref 11–307)

## 2020-06-26 MED ORDER — INSULIN ASPART 100 UNIT/ML ~~LOC~~ SOLN
7.0000 [IU] | Freq: Once | SUBCUTANEOUS | Status: AC
  Administered 2020-06-26: 7 [IU] via SUBCUTANEOUS

## 2020-06-26 MED ORDER — INSULIN DETEMIR 100 UNIT/ML ~~LOC~~ SOLN
30.0000 [IU] | Freq: Every day | SUBCUTANEOUS | Status: DC
  Administered 2020-06-26 – 2020-06-27 (×2): 30 [IU] via SUBCUTANEOUS
  Filled 2020-06-26 (×2): qty 0.3

## 2020-06-26 NOTE — Evaluation (Signed)
Clinical/Bedside Swallow Evaluation Patient Details  Name: [name] MRN: [id number] Date of Birth: 1932-12-29  Today's Date: 06/26/2020 Time: SLP Start Time (ACUTE ONLY): 4196 SLP Stop Time (ACUTE ONLY): 1300 SLP Time Calculation (min) (ACUTE ONLY): 25 min  Past Medical History:  Past Medical History:  Diagnosis Date  . Abnormal echocardiogram    a. possible mass on echo 05/2019 on PPM, b. no evidence of mass / atrial lead vegetation on follow up echo 06/2019  . Anemia   . Arthritis   . Breast cancer ([place]) 10/2018   Invasive ductal carcinoma  . Cancer ([place])   . Cataract   . Chronic combined systolic and diastolic CHF (congestive heart failure) ([place])    a. Previously diastolic but patient AVS from outside hospital listed systolic CHF and cardiomyopathy, records pending.  . CKD (chronic kidney disease), stage III   . Clotting disorder ([place])   . CVA (cerebral vascular accident) ([place])    residual right sided weakness and mild dysphagia  . Diabetes mellitus ([place])   . Dizziness and giddiness   . DVT (deep venous thrombosis) (HCC)    a. on anticoagulation for this.  . Essential hypertension   . LBBB (left bundle branch block)   . Mitral regurgitation    a. Mod MR by echo 2014.  . Muscular deconditioning   . Obesity   . Pacemaker   . Pericardial effusion   . SVT (supraventricular tachycardia) ([place])    a. In 2013 she had an EPS with ablation for SVT which did not eliminate the SVT completely. She also had bradycardia which limited medication. She was placed on amiodarone by Dr. Lovena Le.  . Thrombocytopenia ([place]) 11/21/2011   Past Surgical History:  Past Surgical History:  Procedure Laterality Date  . EYE SURGERY    . SUPRAVENTRICULAR TACHYCARDIA ABLATION N/A 10/11/2012   Procedure: SUPRAVENTRICULAR TACHYCARDIA ABLATION;  Surgeon: Evans Lance, MD;  Location: [facility] CATH LAB;  Service: Cardiovascular;  Laterality: N/A;   HPI:  86yo female admitted 06/22/20 with syncope. PMH: breast  cancer, CHF, CKD3, CVA, L bundle branch block and mitral regurgitation, essential HTN, DM, Covid +   Assessment / Plan / Recommendation Clinical Impression  Pt seen at bedside for swallow evaluation. Pt has upper dentures, and reports she prefers soft chopped solids. Pt accepted trials of thin liquid, puree, and soft solid textures. No overt s/s aspiration observed on any consistency. MBS was completed several years ago, and indicated aspiration of large boluses of thin liquids and thins with pills. Recommend dys 2 diet per pt preference, and thin liquids. Recommend meds whole in puree to minimize aspiration risk. Safe swallow precautions left to be [facility] at [facility]. SLP will follow to assess diet tolerance and continue education.   SLP Visit Diagnosis: Dysphagia, unspecified (R13.10)    Aspiration Risk  Mild aspiration risk    Diet Recommendation Dysphagia 2 (Fine chop);Thin liquid   Liquid Administration via: Cup Medication Administration: Whole meds with puree Supervision: Patient able to self feed;Intermittent supervision to cue for compensatory strategies Compensations: Minimize environmental distractions;Slow rate;Small sips/bites Postural Changes: Seated upright at 90 degrees    Other  Recommendations Oral Care Recommendations: Oral care BID   Follow up Recommendations Other (comment) (TBD)      Frequency and Duration min 1 x/week  1 week       Prognosis Prognosis for Safe Diet Advancement: Good      Swallow Study   General Date of Onset: 06/22/20 HPI: 86yo female  admitted 06/22/20 with syncope. PMH: breast cancer, CHF, CKD3, CVA, L bundle branch block and mitral regurgitation, essential HTN, DM, Covid + Type of Study: Bedside Swallow Evaluation Previous Swallow Assessment: MBS 09/26/2017 = trace flash penetration on thin liquids. Sensed aspiration occurred with larger straw sips, and when she tries to coordinate the thin liquid with a pill. Diet Prior to this Study: Dysphagia  3 (soft);Thin liquids Temperature Spikes Noted: No Respiratory Status: Room air History of Recent Intubation: No Behavior/Cognition: Alert;Cooperative;Pleasant mood Oral Cavity Assessment: Within Functional Limits Oral Care Completed by SLP: No Oral Cavity - Dentition: Dentures, top Vision: Functional for self-feeding Self-Feeding Abilities: Able to feed self Patient Positioning: Upright in bed Baseline Vocal Quality: Normal Volitional Cough: Strong Volitional Swallow: Able to elicit    Oral/Motor/Sensory Function Overall Oral Motor/Sensory Function: Within functional limits   Thin Liquid Thin Liquid: Within functional limits    Puree Puree: Within functional limits Presentation: Self Fed;Spoon   Solid     Solid: Within functional limits Other Comments: softened graham cracker - pt reports preference of soft chopped solids.     [name], [facility], [place] Speech Language Pathologist Office: [phone] Pager: [phone]  Shonna Chock 06/26/2020,1:11 PM

## 2020-06-26 NOTE — Progress Notes (Signed)
VAST consult received to obtain IV access as pt's current IV access is leaking.  Upon entering pt's room, she was up in chair eating dinner.  Spoke with pt's nurse Crystal and advised that pt needs to be in bed for IV start. Asked that RN place consult once pt finished with dinner and back in bed. Crystal verbalized understanding.

## 2020-06-26 NOTE — Progress Notes (Signed)
PROGRESS NOTE    [name]  DJT:[id number] DOB: 1933/05/08 DOA: 06/22/2020 PCP: [name], MD    Brief Narrative:  86 year old with history of breast cancer, chronic combined heart failure, chronic kidney disease stage IIIb, history of stroke and hypertension diagnosed with COVID-19 about 3 days prior to arrival.  Was started on antibody infusion which she received.  Went home and started having diarrhea felt weak and drowsy so came to the ER. At the emergency room reported orthostatic.  Hemodynamically stable at rest.  Chest x-ray was clear. 9/2, started coughing and wheezing, repeat x-ray with pneumonia consistent with COVID-19 lung involvement and is started on Covid directed therapy with remdesivir and steroids.  Assessment & Plan:   Principal Problem:   Syncope and collapse Active Problems:   Acute on chronic diastolic CHF (congestive heart failure) (HCC)   CKD (chronic kidney disease), stage III (HCC)   LBBB (left bundle branch block)   DM (diabetes mellitus) ([place])   Malignant neoplasm of lower-inner quadrant of right breast of female, estrogen receptor positive ([place])   COVID-19 virus infection   Hypokalemia  Acute syncopal event in the setting of hypotension and dehydration: Treated with IV fluids with improvement. Diarrhea resolved.  COVID-19 pneumonia: Initially with minimal symptoms.  Subsequently developed more cough and congestion with evidence of lung involvement. Continue to monitor due to significant symptoms  chest physiotherapy, incentive spirometry, deep breathing exercises, sputum induction, mucolytic's and bronchodilators. Supplemental oxygen to keep saturations more than 90%. Covid directed therapy with , steroids, Solu-Medrol remdesivir, day 2/5 COVID-19 Labs  Recent Labs    06/25/20 1753  DDIMER 1.81*  FERRITIN 185  CRP 0.6   SpO2: 100 %   Lab Results  Component Value Date   SARSCOV2NAA POSITIVE (A) 06/19/2020   [place] NEGATIVE  08/20/2019   [place] NEGATIVE 08/10/2019   SARSCOV2NAA NOT DETECTED 07/22/2019   Insulin-dependent diabetes mellitus: Poorly controlled at home with A1c of 9. On reduced dose of insulin because of early morning hypoglycemia. Now her blood sugars are picking up, we will keep on the current doses tonight and probably escalate tomorrow.  Chronic kidney disease stage IIIb: Stable at baseline.  Chronic left bundle branch block: Stable at baseline.  History of DVT: On Eliquis  Chronic combined heart failure: Euvolemic.  Hypokalemia: Severe.  Replaced aggressively with improvement.  Magnesium improved.   DVT prophylaxis: apixaban (ELIQUIS) tablet 2.5 mg Start: 06/23/20 1000 apixaban (ELIQUIS) tablet 2.5 mg   Code Status: Full code Family Communication: Daughter on the phone 9/2. Disposition Plan: Status is: Inpatient  Remains inpatient appropriate because:Inpatient level of care appropriate due to severity of illness   Dispo: The patient is from: Home              Anticipated d/c is to: SNF              Anticipated d/c date is: 3 to 4 days.              Patient currently is not medically stable to d/c.         Consultants:   None  Procedures:   None  Antimicrobials:   None   Subjective: Patient seen and examined. Has some cough. Diarrhea improved. She feels like she could better breathe today.  Objective: Vitals:   06/25/20 2040 06/26/20 0500 06/26/20 0521 06/26/20 1317  BP: 128/80  (!) 146/77 (!) 146/64  Pulse: 63  67 63  Resp: 16  18 16   Temp: 97.9  F (36.6 C)  98.4 F (36.9 C) 98.3 F (36.8 C)  TempSrc: Oral  Oral   SpO2: 95%  93% 100%  Weight:  81.2 kg      Intake/Output Summary (Last 24 hours) at 06/26/2020 1319 Last data filed at 06/26/2020 1305 Gross per 24 hour  Intake 2324 ml  Output 550 ml  Net 1774 ml   Filed Weights   06/24/20 0509 06/25/20 0500 06/26/20 0500  Weight: 91.6 kg 91.7 kg 81.2 kg    Examination:  Physical  Exam Constitutional:      Comments: Chronically sick looking and frail lady not in any distress. Anxious with intractable cough.  HENT:     Head: Atraumatic.  Cardiovascular:     Rate and Rhythm: Regular rhythm.  Pulmonary:     Comments: Bilateral equal air entry. He has plenty of conducted upper airway noise. Abdominal:     Palpations: Abdomen is soft.  Musculoskeletal:     Cervical back: Neck supple.  Neurological:     General: No focal deficit present.     Mental Status: She is oriented to person, place, and time.       Data Reviewed: I have personally reviewed following labs and imaging studies  CBC: Recent Labs  Lab 06/22/20 1621 06/23/20 0446 06/24/20 0835 06/26/20 0408  WBC 3.2* 2.4* 2.3* 3.5*  NEUTROABS 1.8  --  1.1* 2.3  HGB 10.5* 10.4* 9.9* 9.6*  HCT 32.8* 32.8* 31.2* 31.4*  MCV 96.5 95.9 96.6 99.4  PLT 94* 89* 93* 884*   Basic Metabolic Panel: Recent Labs  Lab 06/22/20 1621 06/23/20 0446 06/24/20 0835 06/26/20 0408  NA 140 140 142 141  K 3.4* 2.9* 3.5 4.8  CL 101 102 105 110  CO2 28 26 26 23   GLUCOSE 171* 139* 87 251*  BUN 33* 33* 30* 18  CREATININE 1.78* 1.65* 1.50* 1.29*  CALCIUM 8.1* 7.9* 8.1* 8.1*  MG  --   --  1.9 2.0  PHOS  --   --   --  3.2   GFR: Estimated Creatinine Clearance: 33.6 mL/min (A) (by C-G formula based on SCr of 1.29 mg/dL (H)). Liver Function Tests: Recent Labs  Lab 06/22/20 1621 06/23/20 0446 06/26/20 0408  AST 65* 55* 27  ALT 55* 47* 29  ALKPHOS 64 55 52  BILITOT 0.4 0.5 0.3  PROT 7.2 6.6 6.1*  ALBUMIN 3.4* 3.1* 2.8*   Recent Labs  Lab 06/22/20 1621  LIPASE 22   No results for input(s): AMMONIA in the last 168 hours. Coagulation Profile: No results for input(s): INR, PROTIME in the last 168 hours. Cardiac Enzymes: No results for input(s): CKTOTAL, CKMB, CKMBINDEX, TROPONINI in the last 168 hours. BNP (last 3 results) No results for input(s): PROBNP in the last 8760 hours. HbA1C: No results for  input(s): HGBA1C in the last 72 hours. CBG: Recent Labs  Lab 06/25/20 1646 06/25/20 2038 06/26/20 0557 06/26/20 0746 06/26/20 1200  GLUCAP 86 79 245* 200* 349*   Lipid Profile: No results for input(s): CHOL, HDL, LDLCALC, TRIG, CHOLHDL, LDLDIRECT in the last 72 hours. Thyroid Function Tests: No results for input(s): TSH, T4TOTAL, FREET4, T3FREE, THYROIDAB in the last 72 hours. Anemia Panel: Recent Labs    06/25/20 1753  FERRITIN 185   Sepsis Labs: Recent Labs  Lab 06/22/20 1621  LATICACIDVEN 1.5    Recent Results (from the past 240 hour(s))  SARS Coronavirus 2 by RT PCR (hospital order, performed in [facility] hospital lab) Nasopharyngeal Nasopharyngeal  Swab     Status: Abnormal   Collection Time: 06/19/20 11:50 PM   Specimen: Nasopharyngeal Swab  Result Value Ref Range Status   SARS Coronavirus 2 POSITIVE (A) NEGATIVE Final    Comment: RESULT CALLED TO, READ BACK BY AND VERIFIED WITH: ADKINS,C AT 1660 ON [id number] BY CHERESNOWSKY,T (NOTE) SARS-CoV-2 target nucleic acids are DETECTED  SARS-CoV-2 RNA is generally detectable in upper respiratory specimens  during the acute phase of infection.  Positive results are indicative  of the presence of the identified virus, but do not rule out bacterial infection or co-infection with other pathogens not detected by the test.  Clinical correlation with patient history and  other diagnostic information is necessary to determine patient infection status.  The expected result is negative.  Fact Sheet for Patients:   StrictlyIdeas.no   Fact Sheet for Healthcare Providers:   BankingDealers.co.za    This test is not yet approved or cleared by the [place] FDA and  has been authorized for detection and/or diagnosis of SARS-CoV-2 by FDA under an Emergency Use Authorization (EUA).  This EUA will remain in effect (meani ng this test can be used) for the duration of  the COVID-19  declaration under Section 564(b)(1) of the Act, 21 U.S.C. section 360-bbb-3(b)(1), unless the authorization is terminated or revoked sooner.  Performed at [facility], [address] [place], [place] [address]          Radiology Studies: DG CHEST PORT 1 VIEW  Result Date: 06/25/2020 CLINICAL DATA:  History of breast cancer. Heart failure, kidney failure. COVID-19 positive EXAM: PORTABLE CHEST 1 VIEW COMPARISON:  06/22/2020 FINDINGS: Decreased lung volume. Progression of mild bibasilar airspace disease which could be atelectasis or pneumonia. No effusion. Dual lead pacemaker unchanged. Chronic rotator cuff impingement/rotator cuff tear. IMPRESSION: Progression of mild bibasilar airspace disease which may be atelectasis or pneumonia. Electronically Signed   By: Franchot Gallo M.D.   On: 06/25/2020 17:05        Scheduled Meds: . amiodarone  200 mg Oral BID  . anastrozole  1 mg Oral Daily  . apixaban  2.5 mg Oral BID  . atorvastatin  20 mg Oral QHS  . carvedilol  12.5 mg Oral BID WC  . cholecalciferol  1,000 Units Oral Daily  . famotidine  20 mg Oral Daily  . ferrous sulfate  325 mg Oral Q breakfast  . insulin aspart  0-15 Units Subcutaneous TID WC  . insulin aspart  0-5 Units Subcutaneous QHS  . insulin detemir  25 Units Subcutaneous QHS  . isosorbide-hydrALAZINE  1 tablet Oral TID  . methylPREDNISolone (SOLU-MEDROL) injection  40 mg Intravenous Q12H  . metoprolol succinate  50 mg Oral Daily  . mirabegron ER  50 mg Oral Daily  . mirtazapine  7.5 mg Oral QHS  . oxybutynin  5 mg Oral BID  . pantoprazole  40 mg Oral Daily  . potassium chloride SA  40 mEq Oral Daily  . pregabalin  75 mg Oral QHS  . sodium chloride flush  3 mL Intravenous Q12H  . vitamin B-12  250 mcg Oral Daily   Continuous Infusions: . remdesivir 100 mg in NS 100 mL 100 mg (06/26/20 1232)     LOS: 4 days    Time spent: 30 minutes    [name], MD [facility] Pager  [phone]

## 2020-06-26 NOTE — Progress Notes (Signed)
Results for FARAH, LEPAK (MRN [id number]) as of 06/26/2020 15:15  Ref. Range 06/25/2020 16:46 06/25/2020 20:38 06/26/2020 05:57 06/26/2020 07:46 06/26/2020 12:00  Glucose-Capillary Latest Ref Range: 70 - 99 mg/dL 86 79 245 (H) 200 (H) 349 (H)  Noted that blood sugars continue to be greater than 180 mg/dl.  If blood sugars continue to be elevated, recommend increasing Novolog correction scale to RESISTANT, and add Novolog 4 units TID with meals if patient eats at least 50% of meal.   Harvel Ricks RN BSN CDE Diabetes Coordinator Pager: [phone]  8am-5pm

## 2020-06-27 ENCOUNTER — Other Ambulatory Visit: Payer: Self-pay

## 2020-06-27 LAB — COMPREHENSIVE METABOLIC PANEL
ALT: 27 U/L (ref 0–44)
AST: 23 U/L (ref 15–41)
Albumin: 2.8 g/dL — ABNORMAL LOW (ref 3.5–5.0)
Alkaline Phosphatase: 53 U/L (ref 38–126)
Anion gap: 6 (ref 5–15)
BUN: 26 mg/dL — ABNORMAL HIGH (ref 8–23)
CO2: 24 mmol/L (ref 22–32)
Calcium: 8.5 mg/dL — ABNORMAL LOW (ref 8.9–10.3)
Chloride: 108 mmol/L (ref 98–111)
Creatinine, Ser: 1.31 mg/dL — ABNORMAL HIGH (ref 0.44–1.00)
GFR calc Af Amer: 43 mL/min — ABNORMAL LOW (ref >60)
GFR calc non Af Amer: 37 mL/min — ABNORMAL LOW (ref >60)
Glucose, Bld: 243 mg/dL — ABNORMAL HIGH (ref 70–99)
Potassium: 5.1 mmol/L (ref 3.5–5.1)
Sodium: 138 mmol/L (ref 135–145)
Total Bilirubin: 0.3 mg/dL (ref 0.3–1.2)
Total Protein: 6.1 g/dL — ABNORMAL LOW (ref 6.5–8.1)

## 2020-06-27 LAB — GLUCOSE, CAPILLARY
Glucose-Capillary: 224 mg/dL — ABNORMAL HIGH (ref 70–99)
Glucose-Capillary: 207 mg/dL — ABNORMAL HIGH (ref 70–99)
Glucose-Capillary: 197 mg/dL — ABNORMAL HIGH (ref 70–99)
Glucose-Capillary: 259 mg/dL — ABNORMAL HIGH (ref 70–99)
Glucose-Capillary: 353 mg/dL — ABNORMAL HIGH (ref 70–99)

## 2020-06-27 MED ORDER — INSULIN ASPART 100 UNIT/ML ~~LOC~~ SOLN
8.0000 [IU] | Freq: Three times a day (TID) | SUBCUTANEOUS | Status: DC
  Administered 2020-06-27 – 2020-06-28 (×4): 8 [IU] via SUBCUTANEOUS

## 2020-06-27 MED ORDER — TORSEMIDE 20 MG PO TABS
20.0000 mg | ORAL_TABLET | Freq: Every day | ORAL | Status: DC
  Administered 2020-06-27 – 2020-07-01 (×5): 20 mg via ORAL
  Filled 2020-06-27 (×5): qty 1

## 2020-06-27 NOTE — Progress Notes (Signed)
PROGRESS NOTE    [name]  VVO:[id number] DOB: April 06, 1933 DOA: 06/22/2020 PCP: [name], MD    Brief Narrative:  86 year old with history of breast cancer, chronic combined heart failure, chronic kidney disease stage IIIb, history of stroke and hypertension diagnosed with COVID-19 about 3 days prior to arrival.  Was started on antibody infusion which she received.  Went home and started having diarrhea felt weak and drowsy so came to the ER. At the emergency room reported orthostatic.  Hemodynamically stable at rest.  Chest x-ray was clear.  9/2, started coughing and wheezing, repeat x-ray with pneumonia consistent with COVID-19 lung involvement and is started on Covid directed therapy with remdesivir and steroids.  Assessment & Plan:   Principal Problem:   Syncope and collapse Active Problems:   Acute on chronic diastolic CHF (congestive heart failure) (HCC)   CKD (chronic kidney disease), stage III (HCC)   LBBB (left bundle branch block)   DM (diabetes mellitus) ([place])   Malignant neoplasm of lower-inner quadrant of right breast of female, estrogen receptor positive ([place])   COVID-19 virus infection   Hypokalemia  Acute syncopal event in the setting of hypotension and dehydration: Treated with IV fluids with improvement. Diarrhea resolved.  COVID-19 pneumonia: Initially with minimal symptoms.  Subsequently developed more cough and congestion with evidence of lung involvement. Continue to monitor due to significant symptoms  chest physiotherapy, incentive spirometry, deep breathing exercises, sputum induction, mucolytic's and bronchodilators. Supplemental oxygen to keep saturations more than 90%. Covid directed therapy with , steroids, Solu-Medrol remdesivir, day 3/5 COVID-19 Labs  Recent Labs    06/25/20 1753  DDIMER 1.81*  FERRITIN 185  CRP 0.6   SpO2: 97 %   Lab Results  Component Value Date   SARSCOV2NAA POSITIVE (A) 06/19/2020   [place] NEGATIVE  08/20/2019   [place] NEGATIVE 08/10/2019   SARSCOV2NAA NOT DETECTED 07/22/2019   Insulin-dependent diabetes mellitus: Poorly controlled at home with A1c of 9.  Blood sugars elevated.  On increasing dose of long-acting insulin.  We will continue to uptitrate.  Chronic kidney disease stage IIIb: Stable at baseline.  Chronic left bundle branch block: Stable at baseline.  History of DVT: On Eliquis  Chronic combined heart failure: Euvolemic.  Hypokalemia: Severe.  Replaced aggressively with improvement.  Magnesium improved. Discontinue potassium supplement.  Start torsemide.   DVT prophylaxis: apixaban (ELIQUIS) tablet 2.5 mg Start: 06/23/20 1000 apixaban (ELIQUIS) tablet 2.5 mg   Code Status: Full code Family Communication: Daughter called, unable to leave message. Disposition Plan: Status is: Inpatient  Remains inpatient appropriate because:Inpatient level of care appropriate due to severity of illness   Dispo: The patient is from: Home              Anticipated d/c is to: SNF              Anticipated d/c date is: When bed is available.              Patient currently is medically stable to transfer to skilled level of care.      Consultants:   None  Procedures:   None  Antimicrobials:  Antibiotics Given (last 72 hours)    Date/Time Action Medication Dose Rate   06/25/20 2042 New Bag/Given   remdesivir 200 mg in sodium chloride 0.9% 250 mL IVPB 200 mg 580 mL/hr   06/26/20 1232 New Bag/Given   remdesivir 100 mg in sodium chloride 0.9 % 100 mL IVPB 100 mg 200 mL/hr   06/27/20  0911 New Bag/Given   remdesivir 100 mg in sodium chloride 0.9 % 100 mL IVPB 100 mg 200 mL/hr        Subjective: Seen and examined.  Sleepy.  No overnight events.  No more shortness of breath.  No more diarrhea.  Objective: Vitals:   06/26/20 2020 06/27/20 0500 06/27/20 0511 06/27/20 0745  BP: (!) 145/93  116/65   Pulse: 73  63   Resp: 18  18   Temp: 99 F (37.2 C)  98.6 F (37  C)   TempSrc:      SpO2: 99%  97%   Weight:  82.6 kg    Height:    5\' 5"  (1.651 m)    Intake/Output Summary (Last 24 hours) at 06/27/2020 1330 Last data filed at 06/27/2020 0800 Gross per 24 hour  Intake 118 ml  Output --  Net 118 ml   Filed Weights   06/25/20 0500 06/26/20 0500 06/27/20 0500  Weight: 91.7 kg 81.2 kg 82.6 kg    Examination:  Physical Exam Constitutional:      Comments: Looks comfortable.  Not in any distress.  Normal mood.  HENT:     Head: Atraumatic.  Cardiovascular:     Rate and Rhythm: Regular rhythm.  Pulmonary:     Comments: Bilateral equal air entry. Abdominal:     Palpations: Abdomen is soft.  Musculoskeletal:     Cervical back: Neck supple.  Neurological:     General: No focal deficit present.     Mental Status: She is oriented to person, place, and time.       Data Reviewed: I have personally reviewed following labs and imaging studies  CBC: Recent Labs  Lab 06/22/20 1621 06/23/20 0446 06/24/20 0835 06/26/20 0408  WBC 3.2* 2.4* 2.3* 3.5*  NEUTROABS 1.8  --  1.1* 2.3  HGB 10.5* 10.4* 9.9* 9.6*  HCT 32.8* 32.8* 31.2* 31.4*  MCV 96.5 95.9 96.6 99.4  PLT 94* 89* 93* 563*   Basic Metabolic Panel: Recent Labs  Lab 06/22/20 1621 06/23/20 0446 06/24/20 0835 06/26/20 0408 06/27/20 0355  NA 140 140 142 141 138  K 3.4* 2.9* 3.5 4.8 5.1  CL 101 102 105 110 108  CO2 28 26 26 23 24   GLUCOSE 171* 139* 87 251* 243*  BUN 33* 33* 30* 18 26*  CREATININE 1.78* 1.65* 1.50* 1.29* 1.31*  CALCIUM 8.1* 7.9* 8.1* 8.1* 8.5*  MG  --   --  1.9 2.0  --   PHOS  --   --   --  3.2  --    GFR: Estimated Creatinine Clearance: 33.3 mL/min (A) (by C-G formula based on SCr of 1.31 mg/dL (H)). Liver Function Tests: Recent Labs  Lab 06/22/20 1621 06/23/20 0446 06/26/20 0408 06/27/20 0355  AST 65* 55* 27 23  ALT 55* 47* 29 27  ALKPHOS 64 55 52 53  BILITOT 0.4 0.5 0.3 0.3  PROT 7.2 6.6 6.1* 6.1*  ALBUMIN 3.4* 3.1* 2.8* 2.8*   Recent Labs  Lab  06/22/20 1621  LIPASE 22   No results for input(s): AMMONIA in the last 168 hours. Coagulation Profile: No results for input(s): INR, PROTIME in the last 168 hours. Cardiac Enzymes: No results for input(s): CKTOTAL, CKMB, CKMBINDEX, TROPONINI in the last 168 hours. BNP (last 3 results) No results for input(s): PROBNP in the last 8760 hours. HbA1C: No results for input(s): HGBA1C in the last 72 hours. CBG: Recent Labs  Lab 06/26/20 1602 06/26/20 2022 06/27/20 8756  06/27/20 0752 06/27/20 1230  GLUCAP 337* 436* 224* 207* 197*   Lipid Profile: No results for input(s): CHOL, HDL, LDLCALC, TRIG, CHOLHDL, LDLDIRECT in the last 72 hours. Thyroid Function Tests: No results for input(s): TSH, T4TOTAL, FREET4, T3FREE, THYROIDAB in the last 72 hours. Anemia Panel: Recent Labs    06/25/20 1753  FERRITIN 185   Sepsis Labs: Recent Labs  Lab 06/22/20 1621  LATICACIDVEN 1.5    Recent Results (from the past 240 hour(s))  SARS Coronavirus 2 by RT PCR (hospital order, performed in [facility] hospital lab) Nasopharyngeal Nasopharyngeal Swab     Status: Abnormal   Collection Time: 06/19/20 11:50 PM   Specimen: Nasopharyngeal Swab  Result Value Ref Range Status   SARS Coronavirus 2 POSITIVE (A) NEGATIVE Final    Comment: RESULT CALLED TO, READ BACK BY AND VERIFIED WITH: ADKINS,C AT 3893 ON [id number] BY CHERESNOWSKY,T (NOTE) SARS-CoV-2 target nucleic acids are DETECTED  SARS-CoV-2 RNA is generally detectable in upper respiratory specimens  during the acute phase of infection.  Positive results are indicative  of the presence of the identified virus, but do not rule out bacterial infection or co-infection with other pathogens not detected by the test.  Clinical correlation with patient history and  other diagnostic information is necessary to determine patient infection status.  The expected result is negative.  Fact Sheet for Patients:    StrictlyIdeas.no   Fact Sheet for Healthcare Providers:   BankingDealers.co.za    This test is not yet approved or cleared by the [place] FDA and  has been authorized for detection and/or diagnosis of SARS-CoV-2 by FDA under an Emergency Use Authorization (EUA).  This EUA will remain in effect (meani ng this test can be used) for the duration of  the COVID-19 declaration under Section 564(b)(1) of the Act, 21 U.S.C. section 360-bbb-3(b)(1), unless the authorization is terminated or revoked sooner.  Performed at [facility], [address], [place], [place] [address]          Radiology Studies: DG CHEST PORT 1 VIEW  Result Date: 06/25/2020 CLINICAL DATA:  History of breast cancer. Heart failure, kidney failure. COVID-19 positive EXAM: PORTABLE CHEST 1 VIEW COMPARISON:  06/22/2020 FINDINGS: Decreased lung volume. Progression of mild bibasilar airspace disease which could be atelectasis or pneumonia. No effusion. Dual lead pacemaker unchanged. Chronic rotator cuff impingement/rotator cuff tear. IMPRESSION: Progression of mild bibasilar airspace disease which may be atelectasis or pneumonia. Electronically Signed   By: Franchot Gallo M.D.   On: 06/25/2020 17:05        Scheduled Meds: . amiodarone  200 mg Oral BID  . anastrozole  1 mg Oral Daily  . apixaban  2.5 mg Oral BID  . atorvastatin  20 mg Oral QHS  . carvedilol  12.5 mg Oral BID WC  . cholecalciferol  1,000 Units Oral Daily  . famotidine  20 mg Oral Daily  . ferrous sulfate  325 mg Oral Q breakfast  . insulin aspart  0-15 Units Subcutaneous TID WC  . insulin aspart  0-5 Units Subcutaneous QHS  . insulin aspart  8 Units Subcutaneous TID WC  . insulin detemir  30 Units Subcutaneous QHS  . isosorbide-hydrALAZINE  1 tablet Oral TID  . methylPREDNISolone (SOLU-MEDROL) injection  40 mg Intravenous Q12H  . metoprolol succinate  50 mg Oral Daily  . mirabegron  ER  50 mg Oral Daily  . mirtazapine  7.5 mg Oral QHS  . oxybutynin  5 mg  Oral BID  . pantoprazole  40 mg Oral Daily  . pregabalin  75 mg Oral QHS  . sodium chloride flush  3 mL Intravenous Q12H  . torsemide  20 mg Oral Daily  . vitamin B-12  250 mcg Oral Daily   Continuous Infusions: . remdesivir 100 mg in NS 100 mL 100 mg (06/27/20 0911)     LOS: 5 days    Time spent: 30 minutes    [name], MD [facility] Pager [phone]

## 2020-06-28 LAB — GLUCOSE, CAPILLARY
Glucose-Capillary: 421 mg/dL — ABNORMAL HIGH (ref 70–99)
Glucose-Capillary: 271 mg/dL — ABNORMAL HIGH (ref 70–99)
Glucose-Capillary: 340 mg/dL — ABNORMAL HIGH (ref 70–99)
Glucose-Capillary: 239 mg/dL — ABNORMAL HIGH (ref 70–99)
Glucose-Capillary: 240 mg/dL — ABNORMAL HIGH (ref 70–99)

## 2020-06-28 LAB — COMPREHENSIVE METABOLIC PANEL
ALT: 27 U/L (ref 0–44)
AST: 24 U/L (ref 15–41)
Albumin: 2.8 g/dL — ABNORMAL LOW (ref 3.5–5.0)
Alkaline Phosphatase: 51 U/L (ref 38–126)
Anion gap: 9 (ref 5–15)
BUN: 38 mg/dL — ABNORMAL HIGH (ref 8–23)
CO2: 24 mmol/L (ref 22–32)
Calcium: 8.9 mg/dL (ref 8.9–10.3)
Chloride: 106 mmol/L (ref 98–111)
Creatinine, Ser: 1.46 mg/dL — ABNORMAL HIGH (ref 0.44–1.00)
GFR calc Af Amer: 38 mL/min — ABNORMAL LOW (ref >60)
GFR calc non Af Amer: 32 mL/min — ABNORMAL LOW (ref >60)
Glucose, Bld: 288 mg/dL — ABNORMAL HIGH (ref 70–99)
Potassium: 5.2 mmol/L — ABNORMAL HIGH (ref 3.5–5.1)
Sodium: 139 mmol/L (ref 135–145)
Total Bilirubin: 0.4 mg/dL (ref 0.3–1.2)
Total Protein: 6.3 g/dL — ABNORMAL LOW (ref 6.5–8.1)

## 2020-06-28 MED ORDER — INSULIN ASPART 100 UNIT/ML ~~LOC~~ SOLN
10.0000 [IU] | Freq: Three times a day (TID) | SUBCUTANEOUS | Status: DC
  Administered 2020-06-28 – 2020-07-01 (×9): 10 [IU] via SUBCUTANEOUS

## 2020-06-28 MED ORDER — INSULIN DETEMIR 100 UNIT/ML ~~LOC~~ SOLN
35.0000 [IU] | Freq: Every day | SUBCUTANEOUS | Status: DC
  Administered 2020-06-28 – 2020-06-30 (×3): 35 [IU] via SUBCUTANEOUS
  Filled 2020-06-28 (×3): qty 0.35

## 2020-06-28 NOTE — Progress Notes (Signed)
Patient c/o severe superficial pain to left chest wall and back during coughing. Reports that it's too painful to cough forcefully enough to bring up sputum. Lung fields CTA but coarse sounds present medially. Positioned patient up in bed, pillow for splinting, administered pain and cough prns. O2 sats at 96% on room air but applied O2 for patient comfort. Cardiac monitor shows no change. Will cont to monitor.

## 2020-06-28 NOTE — Progress Notes (Signed)
Patient's daughter, [name], brought patient belongings and requested that we use an updated cell phone number to contact her for updates. That number has been input in the chart. It is [phone].

## 2020-06-28 NOTE — Progress Notes (Signed)
PROGRESS NOTE    [name]  FXT:[id number] DOB: Jan 04, 1933 DOA: 06/22/2020 PCP: [name], MD    Brief Narrative:  86 year old with history of breast cancer on anastrozole, SVT status post ablation with permanent pacemaker, chronic combined heart failure, chronic kidney disease stage IIIb, history of stroke and hypertension diagnosed with COVID-19 about 3 days prior to arrival.  Was started on antibody infusion which she received.  Went home and started having diarrhea felt weak and drowsy so came to the ER. At the emergency room reported orthostatic.  Hemodynamically stable at rest.  Chest x-ray was clear.  9/2, started coughing and wheezing, repeat x-ray with pneumonia consistent with COVID-19 lung involvement and is started on Covid directed therapy with remdesivir and steroids.  Clinically improving.  Assessment & Plan:   Principal Problem:   Syncope and collapse Active Problems:   SVT (supraventricular tachycardia) (HCC)   Acute on chronic diastolic CHF (congestive heart failure) (HCC)   CKD (chronic kidney disease), stage III (HCC)   LBBB (left bundle branch block)   DM (diabetes mellitus) ([place])   Malignant neoplasm of lower-inner quadrant of right breast of female, estrogen receptor positive ([place])   COVID-19 virus infection   Hypokalemia  Acute syncopal event in the setting of hypotension and dehydration: Treated with IV fluids with improvement. Diarrhea resolved.  Clinically improving. Did not have any evidence of pacemaker malfunction.  COVID-19 pneumonia: Initially with minimal symptoms.  Subsequently developed more cough and congestion with evidence of lung involvement. Continue to monitor due to significant symptoms  chest physiotherapy, incentive spirometry, deep breathing exercises, sputum induction, mucolytic's and bronchodilators. Supplemental oxygen to keep saturations more than 90%. Covid directed therapy with , steroids, Solu-Medrol remdesivir, day  4/5 COVID-19 Labs  Recent Labs    06/25/20 1753  DDIMER 1.81*  FERRITIN 185  CRP 0.6   SpO2: 98 %   Lab Results  Component Value Date   SARSCOV2NAA POSITIVE (A) 06/19/2020   [place] NEGATIVE 08/20/2019   [place] NEGATIVE 08/10/2019   SARSCOV2NAA NOT DETECTED 07/22/2019   Insulin-dependent diabetes mellitus: Poorly controlled at home with A1c of 9.  Blood sugars elevated.  On increasing dose of long-acting insulin.   With improved appetite, now blood sugars are greatly elevated.  Increase Lantus 35 units at night, increase prandial insulin 10 units 3 times a day.  Chronic kidney disease stage IIIb: Stable at baseline.  SVT status post pacemaker: Stable.  Patient on amiodarone and normal sinus rhythm.  Therapeutic on Eliquis.  Chronic pain syndrome: On Lyrica.  Chronic left bundle branch block: Stable at baseline.  History of DVT: On Eliquis  Chronic combined heart failure: Euvolemic.  Hypokalemia: Severe.  Replaced aggressively with improvement.  Magnesium improved. Discontinue potassium supplement.  Start torsemide.   DVT prophylaxis: apixaban (ELIQUIS) tablet 2.5 mg Start: 06/23/20 1000 apixaban (ELIQUIS) tablet 2.5 mg   Code Status: Full code Family Communication: daughter [name] called and updated. Disposition Plan: Status is: Inpatient  Remains inpatient appropriate because:Inpatient level of care appropriate due to severity of illness   Dispo: The patient is from: Home              Anticipated d/c is to: SNF              Anticipated d/c date is: When bed is available.              Patient currently is medically stable to transfer to skilled level of care.  Consultants:   None  Procedures:   None  Antimicrobials:  Antibiotics Given (last 72 hours)    Date/Time Action Medication Dose Rate   06/25/20 2042 New Bag/Given   remdesivir 200 mg in sodium chloride 0.9% 250 mL IVPB 200 mg 580 mL/hr   06/26/20 1232 New Bag/Given    remdesivir 100 mg in sodium chloride 0.9 % 100 mL IVPB 100 mg 200 mL/hr   06/27/20 0911 New Bag/Given   remdesivir 100 mg in sodium chloride 0.9 % 100 mL IVPB 100 mg 200 mL/hr   06/28/20 0935 New Bag/Given   remdesivir 100 mg in sodium chloride 0.9 % 100 mL IVPB 100 mg 200 mL/hr       Subjective: Seen and examined.  No overnight events.  No more breathing trouble or cough.  No more diarrhea.   "Everything is amazing in this hospital except the food"  Objective: Vitals:   06/27/20 1537 06/27/20 2032 06/28/20 0500 06/28/20 0520  BP: 128/61 129/72  (!) 141/78  Pulse: 61 65  64  Resp: 20 16  16   Temp: 98.9 F (37.2 C) 98.9 F (37.2 C)  98.2 F (36.8 C)  TempSrc:    Oral  SpO2: 93% 95%  98%  Weight:   87.1 kg   Height:        Intake/Output Summary (Last 24 hours) at 06/28/2020 1150 Last data filed at 06/28/2020 0800 Gross per 24 hour  Intake 236 ml  Output 1000 ml  Net -764 ml   Filed Weights   06/26/20 0500 06/27/20 0500 06/28/20 0500  Weight: 81.2 kg 82.6 kg 87.1 kg    Examination:  Physical Exam Constitutional:      Comments: Looks comfortable.  Not in any distress.  Normal mood.  HENT:     Head: Atraumatic.  Cardiovascular:     Rate and Rhythm: Regular rhythm.  Pulmonary:     Comments: Bilateral equal air entry. Abdominal:     Palpations: Abdomen is soft.  Musculoskeletal:     Cervical back: Neck supple.  Neurological:     General: No focal deficit present.     Mental Status: She is oriented to person, place, and time.       Data Reviewed: I have personally reviewed following labs and imaging studies  CBC: Recent Labs  Lab 06/22/20 1621 06/23/20 0446 06/24/20 0835 06/26/20 0408  WBC 3.2* 2.4* 2.3* 3.5*  NEUTROABS 1.8  --  1.1* 2.3  HGB 10.5* 10.4* 9.9* 9.6*  HCT 32.8* 32.8* 31.2* 31.4*  MCV 96.5 95.9 96.6 99.4  PLT 94* 89* 93* 010*   Basic Metabolic Panel: Recent Labs  Lab 06/23/20 0446 06/24/20 0835 06/26/20 0408 06/27/20 0355  06/28/20 0459  NA 140 142 141 138 139  K 2.9* 3.5 4.8 5.1 5.2*  CL 102 105 110 108 106  CO2 26 26 23 24 24   GLUCOSE 139* 87 251* 243* 288*  BUN 33* 30* 18 26* 38*  CREATININE 1.65* 1.50* 1.29* 1.31* 1.46*  CALCIUM 7.9* 8.1* 8.1* 8.5* 8.9  MG  --  1.9 2.0  --   --   PHOS  --   --  3.2  --   --    GFR: Estimated Creatinine Clearance: 30.7 mL/min (A) (by C-G formula based on SCr of 1.46 mg/dL (H)). Liver Function Tests: Recent Labs  Lab 06/22/20 1621 06/23/20 0446 06/26/20 0408 06/27/20 0355 06/28/20 0459  AST 65* 55* 27 23 24   ALT 55* 47* 29 27 27  ALKPHOS 64 55 52 53 51  BILITOT 0.4 0.5 0.3 0.3 0.4  PROT 7.2 6.6 6.1* 6.1* 6.3*  ALBUMIN 3.4* 3.1* 2.8* 2.8* 2.8*   Recent Labs  Lab 06/22/20 1621  LIPASE 22   No results for input(s): AMMONIA in the last 168 hours. Coagulation Profile: No results for input(s): INR, PROTIME in the last 168 hours. Cardiac Enzymes: No results for input(s): CKTOTAL, CKMB, CKMBINDEX, TROPONINI in the last 168 hours. BNP (last 3 results) No results for input(s): PROBNP in the last 8760 hours. HbA1C: No results for input(s): HGBA1C in the last 72 hours. CBG: Recent Labs  Lab 06/27/20 1717 06/27/20 2033 06/28/20 0522 06/28/20 0820 06/28/20 1120  GLUCAP 259* 353* 421* 271* 340*   Lipid Profile: No results for input(s): CHOL, HDL, LDLCALC, TRIG, CHOLHDL, LDLDIRECT in the last 72 hours. Thyroid Function Tests: No results for input(s): TSH, T4TOTAL, FREET4, T3FREE, THYROIDAB in the last 72 hours. Anemia Panel: Recent Labs    06/25/20 1753  FERRITIN 185   Sepsis Labs: Recent Labs  Lab 06/22/20 1621  LATICACIDVEN 1.5    Recent Results (from the past 240 hour(s))  SARS Coronavirus 2 by RT PCR (hospital order, performed in [facility] hospital lab) Nasopharyngeal Nasopharyngeal Swab     Status: Abnormal   Collection Time: 06/19/20 11:50 PM   Specimen: Nasopharyngeal Swab  Result Value Ref Range Status   SARS Coronavirus 2  POSITIVE (A) NEGATIVE Final    Comment: RESULT CALLED TO, READ BACK BY AND VERIFIED WITH: ADKINS,C AT [id number] ON [id number] BY CHERESNOWSKY,T (NOTE) SARS-CoV-2 target nucleic acids are DETECTED  SARS-CoV-2 RNA is generally detectable in upper respiratory specimens  during the acute phase of infection.  Positive results are indicative  of the presence of the identified virus, but do not rule out bacterial infection or co-infection with other pathogens not detected by the test.  Clinical correlation with patient history and  other diagnostic information is necessary to determine patient infection status.  The expected result is negative.  Fact Sheet for Patients:   StrictlyIdeas.no   Fact Sheet for Healthcare Providers:   BankingDealers.co.za    This test is not yet approved or cleared by the [place] FDA and  has been authorized for detection and/or diagnosis of SARS-CoV-2 by FDA under an Emergency Use Authorization (EUA).  This EUA will remain in effect (meani ng this test can be used) for the duration of  the COVID-19 declaration under Section 564(b)(1) of the Act, 21 U.S.C. section 360-bbb-3(b)(1), unless the authorization is terminated or revoked sooner.  Performed at [facility], [address] [place], [place] [address]          Radiology Studies: No results found.      Scheduled Meds: . amiodarone  200 mg Oral BID  . anastrozole  1 mg Oral Daily  . apixaban  2.5 mg Oral BID  . atorvastatin  20 mg Oral QHS  . carvedilol  12.5 mg Oral BID WC  . cholecalciferol  1,000 Units Oral Daily  . famotidine  20 mg Oral Daily  . ferrous sulfate  325 mg Oral Q breakfast  . insulin aspart  0-15 Units Subcutaneous TID WC  . insulin aspart  0-5 Units Subcutaneous QHS  . insulin aspart  10 Units Subcutaneous TID WC  . insulin detemir  35 Units Subcutaneous QHS  . isosorbide-hydrALAZINE  1 tablet Oral TID  .  methylPREDNISolone (SOLU-MEDROL) injection  40 mg Intravenous Q12H  .  metoprolol succinate  50 mg Oral Daily  . mirabegron ER  50 mg Oral Daily  . mirtazapine  7.5 mg Oral QHS  . oxybutynin  5 mg Oral BID  . pantoprazole  40 mg Oral Daily  . pregabalin  75 mg Oral QHS  . sodium chloride flush  3 mL Intravenous Q12H  . torsemide  20 mg Oral Daily  . vitamin B-12  250 mcg Oral Daily   Continuous Infusions: . remdesivir 100 mg in NS 100 mL 100 mg (06/28/20 0935)     LOS: 6 days    Time spent: 30 minutes    [name], MD [facility] Pager [phone]

## 2020-06-28 NOTE — Progress Notes (Signed)
TOC CSW received consult for pts dc.  CSW contacted [facility] in regards to pts dc and admission to [facility].  Buffy/nurse supervisor at [facility] stated she had not received any paperwork on pt for admission, therefore she could not accept her today.    CSW will continue to follow for dc needs.  Argyle Gustafson Tarpley-Carter, MSW, [facility] Transitions of CareClinical Social Worker [email or web address] [phone]

## 2020-06-29 LAB — GLUCOSE, CAPILLARY
Glucose-Capillary: 255 mg/dL — ABNORMAL HIGH (ref 70–99)
Glucose-Capillary: 250 mg/dL — ABNORMAL HIGH (ref 70–99)
Glucose-Capillary: 321 mg/dL — ABNORMAL HIGH (ref 70–99)
Glucose-Capillary: 313 mg/dL — ABNORMAL HIGH (ref 70–99)

## 2020-06-29 MED ORDER — BENZONATATE 100 MG PO CAPS
100.0000 mg | ORAL_CAPSULE | Freq: Three times a day (TID) | ORAL | Status: DC
  Administered 2020-06-29 – 2020-07-01 (×7): 100 mg via ORAL
  Filled 2020-06-29 (×7): qty 1

## 2020-06-29 MED ORDER — POLYETHYLENE GLYCOL 3350 17 G PO PACK
17.0000 g | PACK | Freq: Every day | ORAL | Status: DC
  Administered 2020-06-30 – 2020-07-01 (×2): 17 g via ORAL
  Filled 2020-06-29 (×2): qty 1

## 2020-06-29 NOTE — Plan of Care (Signed)
  Problem: Clinical Measurements: Goal: Respiratory complications will improve Outcome: Progressing   Problem: Elimination: Goal: Will not experience complications related to bowel motility Outcome: Progressing   Problem: Pain Managment: Goal: General experience of comfort will improve Outcome: Progressing   Problem: Safety: Goal: Ability to remain free from injury will improve Outcome: Progressing   

## 2020-06-29 NOTE — Progress Notes (Signed)
PROGRESS NOTE    [name]  OZH:[id number] DOB: [birth year] DOA: 06/22/2020 PCP: [name], MD    Brief Narrative:  86 year old with history of breast cancer on anastrozole, SVT status post ablation with permanent pacemaker, chronic combined heart failure, chronic kidney disease stage IIIb, history of stroke and hypertension diagnosed with COVID-19 about 3 days prior to arrival.  Was started on antibody infusion which she received.  Went home and started having diarrhea felt weak and drowsy so came to the ER. At the emergency room reported orthostatic.  Hemodynamically stable at rest.  Chest x-ray was clear.  9/2, started coughing and wheezing, repeat x-ray with pneumonia consistent with COVID-19 lung involvement and is started on Covid directed therapy with remdesivir and steroids.  Clinically improving.  Assessment & Plan:   Principal Problem:   Syncope and collapse Active Problems:   SVT (supraventricular tachycardia) (HCC)   Acute on chronic diastolic CHF (congestive heart failure) (HCC)   CKD (chronic kidney disease), stage III (HCC)   LBBB (left bundle branch block)   DM (diabetes mellitus) ([place])   Malignant neoplasm of lower-inner quadrant of right breast of female, estrogen receptor positive ([place])   COVID-19 virus infection   Hypokalemia  Acute syncopal event in the setting of hypotension and dehydration: Treated with IV fluids with improvement. Diarrhea resolved.  Clinically improving. Did not have any evidence of pacemaker malfunction.  COVID-19 pneumonia: Initially with minimal symptoms.  Subsequently developed more cough and congestion with evidence of lung involvement. Continue to monitor due to significant symptoms  chest physiotherapy, incentive spirometry, deep breathing exercises, sputum induction, mucolytic's and bronchodilators. Supplemental oxygen to keep saturations more than 90%. Covid directed therapy with , steroids, Solu-Medrol remdesivir, day  5/5 Symptomatic treatment for left rib cage pain from coughing. COVID-19 Labs  No results for input(s): DDIMER, FERRITIN, LDH, CRP in the last 72 hours. SpO2: 95 %   Lab Results  Component Value Date   SARSCOV2NAA POSITIVE (A) 06/19/2020   [place] NEGATIVE 08/20/2019   [place] NEGATIVE 08/10/2019   SARSCOV2NAA NOT DETECTED 07/22/2019   Insulin-dependent diabetes mellitus: Poorly controlled at home with A1c of 9.  Blood sugars elevated.  On increasing dose of long-acting insulin.   With improved appetite, now blood sugars are greatly elevated.  Increase Lantus 35 units at night, increase prandial insulin 10 units 3 times a day.  Blood sugars better today.  Chronic kidney disease stage IIIb: Stable at baseline.  SVT status post pacemaker: Stable.  Patient on amiodarone and normal sinus rhythm.  Therapeutic on Eliquis.  Chronic pain syndrome: On Lyrica.  Chronic left bundle branch block: Stable at baseline.  History of DVT: On Eliquis  Chronic combined heart failure: Euvolemic.  Hypokalemia: Severe.  Replaced aggressively with improvement.  Magnesium improved. Discontinue potassium supplement.  Start torsemide.   DVT prophylaxis: apixaban (ELIQUIS) tablet 2.5 mg Start: 06/23/20 1000 apixaban (ELIQUIS) tablet 2.5 mg   Code Status: Full code Family Communication: daughter [name] called and updated 6/5.  She is waiting for Lyrica to pick up from pharmacy to drop her to the skilled nursing facility when she goes there. Disposition Plan: Status is: Inpatient  Remains inpatient appropriate because:Inpatient level of care appropriate due to severity of illness   Dispo: The patient is from: Home              Anticipated d/c is to: SNF              Anticipated d/c date is: When  bed is available.  Anticipate tomorrow.              Patient currently is medically stable to transfer to skilled level of care.      Consultants:   None  Procedures:    None  Antimicrobials:  Antibiotics Given (last 72 hours)    Date/Time Action Medication Dose Rate   06/26/20 1232 New Bag/Given   remdesivir 100 mg in sodium chloride 0.9 % 100 mL IVPB 100 mg 200 mL/hr   06/27/20 0911 New Bag/Given   remdesivir 100 mg in sodium chloride 0.9 % 100 mL IVPB 100 mg 200 mL/hr   06/28/20 0935 New Bag/Given   remdesivir 100 mg in sodium chloride 0.9 % 100 mL IVPB 100 mg 200 mL/hr   06/29/20 1026 New Bag/Given   remdesivir 100 mg in sodium chloride 0.9 % 100 mL IVPB 100 mg 200 mL/hr       Subjective: Seen and examined.  No overnight events.  Since yesterday afternoon, she started coughing and started hurting her left lower chest wall.  She wishes not to cough.  Will use some cough suppressants.  Objective: Vitals:   06/28/20 2046 06/29/20 0506 06/29/20 0700 06/29/20 1022  BP: 132/70 (!) 163/83  135/75  Pulse: 69 62  66  Resp: 18 17  16   Temp: 98.6 F (37 C) 98.5 F (36.9 C)    TempSrc: Oral Oral    SpO2: 100% 94%  95%  Weight:   87 kg   Height:        Intake/Output Summary (Last 24 hours) at 06/29/2020 1127 Last data filed at 06/29/2020 0841 Gross per 24 hour  Intake 750.22 ml  Output --  Net 750.22 ml   Filed Weights   06/27/20 0500 06/28/20 0500 06/29/20 0700  Weight: 82.6 kg 87.1 kg 87 kg    Examination:  Physical Exam Constitutional:      Comments: Looks comfortable.  However anxious due to pain from deep breathing and coughing.  HENT:     Head: Atraumatic.  Cardiovascular:     Rate and Rhythm: Regular rhythm.  Pulmonary:     Comments: Bilateral equal air entry. Abdominal:     Palpations: Abdomen is soft.  Musculoskeletal:     Cervical back: Neck supple.  Neurological:     General: No focal deficit present.     Mental Status: She is oriented to person, place, and time.       Data Reviewed: I have personally reviewed following labs and imaging studies  CBC: Recent Labs  Lab 06/22/20 1621 06/23/20 0446  06/24/20 0835 06/26/20 0408  WBC 3.2* 2.4* 2.3* 3.5*  NEUTROABS 1.8  --  1.1* 2.3  HGB 10.5* 10.4* 9.9* 9.6*  HCT 32.8* 32.8* 31.2* 31.4*  MCV 96.5 95.9 96.6 99.4  PLT 94* 89* 93* 696*   Basic Metabolic Panel: Recent Labs  Lab 06/23/20 0446 06/24/20 0835 06/26/20 0408 06/27/20 0355 06/28/20 0459  NA 140 142 141 138 139  K 2.9* 3.5 4.8 5.1 5.2*  CL 102 105 110 108 106  CO2 26 26 23 24 24   GLUCOSE 139* 87 251* 243* 288*  BUN 33* 30* 18 26* 38*  CREATININE 1.65* 1.50* 1.29* 1.31* 1.46*  CALCIUM 7.9* 8.1* 8.1* 8.5* 8.9  MG  --  1.9 2.0  --   --   PHOS  --   --  3.2  --   --    GFR: Estimated Creatinine Clearance: 30.7 mL/min (A) (by  C-G formula based on SCr of 1.46 mg/dL (H)). Liver Function Tests: Recent Labs  Lab 06/22/20 1621 06/23/20 0446 06/26/20 0408 06/27/20 0355 06/28/20 0459  AST 65* 55* 27 23 24   ALT 55* 47* 29 27 27   ALKPHOS 64 55 52 53 51  BILITOT 0.4 0.5 0.3 0.3 0.4  PROT 7.2 6.6 6.1* 6.1* 6.3*  ALBUMIN 3.4* 3.1* 2.8* 2.8* 2.8*   Recent Labs  Lab 06/22/20 1621  LIPASE 22   No results for input(s): AMMONIA in the last 168 hours. Coagulation Profile: No results for input(s): INR, PROTIME in the last 168 hours. Cardiac Enzymes: No results for input(s): CKTOTAL, CKMB, CKMBINDEX, TROPONINI in the last 168 hours. BNP (last 3 results) No results for input(s): PROBNP in the last 8760 hours. HbA1C: No results for input(s): HGBA1C in the last 72 hours. CBG: Recent Labs  Lab 06/28/20 1120 06/28/20 1745 06/28/20 2043 06/29/20 0458 06/29/20 0800  GLUCAP 340* 239* 240* 255* 250*   Lipid Profile: No results for input(s): CHOL, HDL, LDLCALC, TRIG, CHOLHDL, LDLDIRECT in the last 72 hours. Thyroid Function Tests: No results for input(s): TSH, T4TOTAL, FREET4, T3FREE, THYROIDAB in the last 72 hours. Anemia Panel: No results for input(s): VITAMINB12, FOLATE, FERRITIN, TIBC, IRON, RETICCTPCT in the last 72 hours. Sepsis Labs: Recent Labs  Lab  06/22/20 1621  LATICACIDVEN 1.5    Recent Results (from the past 240 hour(s))  SARS Coronavirus 2 by RT PCR (hospital order, performed in [facility] hospital lab) Nasopharyngeal Nasopharyngeal Swab     Status: Abnormal   Collection Time: 06/19/20 11:50 PM   Specimen: Nasopharyngeal Swab  Result Value Ref Range Status   SARS Coronavirus 2 POSITIVE (A) NEGATIVE Final    Comment: RESULT CALLED TO, READ BACK BY AND VERIFIED WITH: ADKINS,C AT [id number] ON [id number] BY CHERESNOWSKY,T (NOTE) SARS-CoV-2 target nucleic acids are DETECTED  SARS-CoV-2 RNA is generally detectable in upper respiratory specimens  during the acute phase of infection.  Positive results are indicative  of the presence of the identified virus, but do not rule out bacterial infection or co-infection with other pathogens not detected by the test.  Clinical correlation with patient history and  other diagnostic information is necessary to determine patient infection status.  The expected result is negative.  Fact Sheet for Patients:   StrictlyIdeas.no   Fact Sheet for Healthcare Providers:   BankingDealers.co.za    This test is not yet approved or cleared by the [place] FDA and  has been authorized for detection and/or diagnosis of SARS-CoV-2 by FDA under an Emergency Use Authorization (EUA).  This EUA will remain in effect (meani ng this test can be used) for the duration of  the COVID-19 declaration under Section 564(b)(1) of the Act, 21 U.S.C. section 360-bbb-3(b)(1), unless the authorization is terminated or revoked sooner.  Performed at [facility], [address], [place], [place] [address]          Radiology Studies: No results found.      Scheduled Meds: . amiodarone  200 mg Oral BID  . anastrozole  1 mg Oral Daily  . apixaban  2.5 mg Oral BID  . atorvastatin  20 mg Oral QHS  . benzonatate  100 mg Oral TID  . carvedilol  12.5 mg  Oral BID WC  . cholecalciferol  1,000 Units Oral Daily  . famotidine  20 mg Oral Daily  . ferrous sulfate  325 mg Oral Q breakfast  . insulin aspart  0-15  Units Subcutaneous TID WC  . insulin aspart  0-5 Units Subcutaneous QHS  . insulin aspart  10 Units Subcutaneous TID WC  . insulin detemir  35 Units Subcutaneous QHS  . isosorbide-hydrALAZINE  1 tablet Oral TID  . methylPREDNISolone (SOLU-MEDROL) injection  40 mg Intravenous Q12H  . metoprolol succinate  50 mg Oral Daily  . mirabegron ER  50 mg Oral Daily  . mirtazapine  7.5 mg Oral QHS  . oxybutynin  5 mg Oral BID  . pantoprazole  40 mg Oral Daily  . pregabalin  75 mg Oral QHS  . sodium chloride flush  3 mL Intravenous Q12H  . torsemide  20 mg Oral Daily  . vitamin B-12  250 mcg Oral Daily   Continuous Infusions:    LOS: 7 days    Time spent: 30 minutes    [name], MD [facility] Pager [phone]

## 2020-06-29 NOTE — Progress Notes (Signed)
Physical Therapy Treatment Patient Details Name: [name] MRN: [id number] DOB: 03-29-1933 Today's Date: 06/29/2020    History of Present Illness Patient is a 86 y.o. female with medical history significant of breast cancer, chronic combined systolic and diastolic CHF, chronic kidney disease stage III, history of CVA, history of left bundle branch block as well as mitral regurgitation, essential hypertension diabetes who was diagnosed with COVID-19 infection 3 days PTA with fever and chills but no respiratory distress.  Patient also was sent to get antibody infusion 06/21/20.   Onset of watery diarrhea after returning home with syncopal episode.    PT Comments    The patient reports feeling better than yesterday but does not feel like getting into recliner. Patient performed UE exercises using theraband and LE exercises while seated/ SPO2 99% on RA. Plans are for [facility] for rehab.  Follow Up Recommendations  SNF     Equipment Recommendations  None recommended by PT    Recommendations for Other Services       Precautions / Restrictions Precautions Precautions: Fall    Mobility  Bed Mobility   Bed Mobility: Supine to Sit;Sit to Supine     Supine to sit: Supervision Sit to supine: Supervision   General bed mobility comments: patient mobilized self to sitting and back to supine  Transfers                 General transfer comment: NT  Ambulation/Gait                 Stairs             Wheelchair Mobility    Modified Rankin (Stroke Patients Only)       Balance   Sitting-balance support: No upper extremity supported;Feet supported Sitting balance-Leahy Scale: Good                                      Cognition Arousal/Alertness: Awake/alert                                            Exercises General Exercises - Lower Extremity Ankle Circles/Pumps: AROM;Both;10 reps Long Arc Quad: AROM;Both;10 reps Hip  ABduction/ADduction: AROM;Both;10 reps Hip Flexion/Marching: AROM;Both;10 reps Toe Raises: AROM;Both;10 reps Heel Raises: AROM;Both;10 reps Shoulder Exercises Shoulder Flexion: AROM;Seated;Both;10 reps;Theraband Shoulder Extension: AROM;Both;10 reps;Seated Shoulder ABduction: AROM;Seated;Both;10 reps Shoulder External Rotation: AROM;Both;10 reps;Seated Elbow Flexion: AROM;Both;10 reps;Seated Elbow Extension: AROM;Both;10 reps;Seated    General Comments        Pertinent Vitals/Pain Pain Assessment: Faces Faces Pain Scale: Hurts a little bit Pain Location: knees and hands Pain Descriptors / Indicators: Discomfort Pain Intervention(s): Limited activity within patient's tolerance;Monitored during session;Repositioned    Home Living                      Prior Function            PT Goals (current goals can now be found in the care plan section) Progress towards PT goals: Progressing toward goals    Frequency    Min 2X/week      PT Plan Current plan remains appropriate    Co-evaluation              AM-PAC PT "6 Clicks" Mobility   Outcome Measure  Help needed turning from your back to your side while in a flat bed without using bedrails?: A Little Help needed moving from lying on your back to sitting on the side of a flat bed without using bedrails?: A Little Help needed moving to and from a bed to a chair (including a wheelchair)?: A Little Help needed standing up from a chair using your arms (e.g., wheelchair or bedside chair)?: A Little Help needed to walk in hospital room?: Total Help needed climbing 3-5 steps with a railing? : Total 6 Click Score: 14    End of Session   Activity Tolerance: Patient tolerated treatment well Patient left: with call bell/phone within reach;with chair alarm set;in bed Nurse Communication: Mobility status PT Visit Diagnosis: Unsteadiness on feet (R26.81);Hemiplegia and hemiparesis Hemiplegia - caused by: Other  cerebrovascular disease     Time: 9675-9163 PT Time Calculation (min) (ACUTE ONLY): 27 min  Charges:  $Therapeutic Exercise: 8-22 mins $Therapeutic Activity: 8-22 mins                     Tresa Endo PT Acute Rehabilitation Services Pager [phone] Office [phone]    Claretha Cooper 06/29/2020, 4:10 PM

## 2020-06-30 ENCOUNTER — Other Ambulatory Visit: Payer: Self-pay | Admitting: Nurse Practitioner

## 2020-06-30 ENCOUNTER — Telehealth: Payer: Medicare Other

## 2020-06-30 ENCOUNTER — Other Ambulatory Visit: Payer: Self-pay | Admitting: Internal Medicine

## 2020-06-30 LAB — GLUCOSE, CAPILLARY
Glucose-Capillary: 266 mg/dL — ABNORMAL HIGH (ref 70–99)
Glucose-Capillary: 238 mg/dL — ABNORMAL HIGH (ref 70–99)
Glucose-Capillary: 294 mg/dL — ABNORMAL HIGH (ref 70–99)
Glucose-Capillary: 259 mg/dL — ABNORMAL HIGH (ref 70–99)
Glucose-Capillary: 268 mg/dL — ABNORMAL HIGH (ref 70–99)

## 2020-06-30 MED ORDER — PREGABALIN 75 MG PO CAPS
75.0000 mg | ORAL_CAPSULE | Freq: Every day | ORAL | 1 refills | Status: DC
Start: 2020-06-30 — End: 2024-09-12

## 2020-06-30 MED ORDER — PREDNISONE 20 MG PO TABS
40.0000 mg | ORAL_TABLET | Freq: Every day | ORAL | Status: DC
  Administered 2020-07-01: 40 mg via ORAL
  Filled 2020-06-30 (×2): qty 2

## 2020-06-30 MED ORDER — AMIODARONE HCL 200 MG PO TABS
200.0000 mg | ORAL_TABLET | Freq: Every day | ORAL | Status: DC
  Administered 2020-07-01: 200 mg via ORAL
  Filled 2020-06-30: qty 1

## 2020-06-30 NOTE — TOC Progression Note (Signed)
Transition of Care [facility]) - Progression Note    Patient Details  Name: [name] MRN: [id number] Date of Birth: 15-Aug-1933  Transition of Care [facility]) CM/SW [place], [place] Phone Number: 06/30/2020, 2:28 PM  Clinical Narrative:  Facility will not take patient without bringing cancer med and Lyrica with her. Daughter states she called PCP last Friday to ask for prescription refill; had to leave message and no refill today.  She will try them back again. In the meantime, I called twice today, leaving a message for both the RN and front desk.  No call back.  Hospitalist offered to write 5 day script, but facility will not budge on their expectation for admission. Called daughter back and asked her to swing by PCP in person to check.  She states she will do that on the way home from a medical appointment at [facility] states they cannot take her tomorrow if no meds today; Thursday at earliest.    Expected Discharge Plan: [place] Barriers to Discharge: SNF Pending bed offer  Expected Discharge Plan and Services Expected Discharge Plan: [place]   Discharge Planning Services: CM Consult Post Acute Care Choice: [place] Living arrangements for the past 2 months: Apartment                                       Social Determinants of Health (SDOH) Interventions    Readmission Risk Interventions Readmission Risk Prevention Plan 01/03/2019 01/02/2019  Transportation Screening (No Data) Complete  PCP or Specialist Appt within 3-5 Days - Complete  HRI or [place] - Complete  Social Work Consult for [place] Planning/Counseling - Complete  Palliative Care Screening - Not Applicable  Medication Review [employer]) - Complete  Some recent data might be hidden

## 2020-06-30 NOTE — Progress Notes (Signed)
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  Was started on antibody infusion which she received.  Went home and started having diarrhea felt weak and drowsy so came to the ER. At the emergency room reported orthostatic.  Hemodynamically stable at rest.  Chest x-ray was clear.  9/2, started coughing and wheezing, repeat x-ray with pneumonia consistent with COVID-19 lung involvement and is started on Covid directed therapy with remdesivir and steroids.  Clinically improving.  Assessment & Plan:   Principal Problem:   Syncope and collapse Active Problems:   SVT (supraventricular tachycardia) (HCC)   Acute on chronic diastolic CHF (congestive heart failure) (HCC)   CKD (chronic kidney disease), stage III (HCC)   LBBB (left bundle branch block)   DM (diabetes mellitus) ([place])   Malignant neoplasm of lower-inner quadrant of right breast of female, estrogen receptor positive ([place])   COVID-19 virus infection   Hypokalemia  Acute syncopal event in the setting of hypotension and dehydration: Treated with IV fluids with improvement. Diarrhea resolved.  Clinically improving. Did not have any evidence of pacemaker malfunction.  COVID-19 pneumonia: Initially with minimal symptoms.  Subsequently developed more cough and congestion with evidence of lung involvement. chest physiotherapy, incentive spirometry, deep breathing exercises, sputum induction, mucolytic's and bronchodilators. Supplemental oxygen to keep saturations more than 90%. Covid directed therapy with , steroids, Solu-Medrol remdesivir, day 5/5 Symptomatic treatment for left rib cage pain from  coughing. Improved symptoms.  Discontinue IV steroids.  Will quickly taper off on prednisone. COVID-19 Labs  No results for input(s): DDIMER, FERRITIN, LDH, CRP in the last 72 hours. SpO2: 98 %   Lab Results  Component Value Date   SARSCOV2NAA POSITIVE (A) 06/19/2020   [place] NEGATIVE 08/20/2019   [place] NEGATIVE 08/10/2019   SARSCOV2NAA NOT DETECTED 07/22/2019   Insulin-dependent diabetes mellitus: Poorly controlled at home with A1c of 9.  Blood sugars elevated.  On increasing dose of long-acting insulin.   With improved appetite, now blood sugars are greatly elevated.  Increase Lantus 35 units at night, increase prandial insulin 10 units 3 times a day.  Blood sugars better today.  Chronic kidney disease stage IIIb: Stable at baseline.  SVT status post pacemaker: Stable.  Patient on amiodarone and normal sinus rhythm.  Therapeutic on Eliquis.  Chronic pain syndrome: On Lyrica.  Chronic left bundle branch block: Stable at baseline.  History of DVT: On Eliquis  Chronic combined heart failure: Euvolemic.  Hypokalemia: Severe.  Replaced aggressively with improvement.  Magnesium improved. Discontinue potassium supplement.  Start torsemide.   DVT prophylaxis: apixaban (ELIQUIS) tablet 2.5 mg Start: 06/23/20 1000 apixaban (ELIQUIS) tablet 2.5 mg   Code Status: Full code Family Communication: daughter [name] called and updated 6/5.  She is waiting for Lyrica to pick up from pharmacy to drop her to the skilled nursing facility when she goes there. Disposition Plan: Status is: Inpatient  Remains inpatient appropriate because:Inpatient level of care appropriate due to severity of illness   Dispo: The patient is from: Home              Anticipated d/c is to: SNF  Anticipated d/c date is: When bed is available.  Medically stable.              Patient currently is medically stable to transfer to skilled level of care.      Consultants:    None  Procedures:   None  Antimicrobials:  Antibiotics Given (last 72 hours)    Date/Time Action Medication Dose Rate   06/28/20 0935 New Bag/Given   remdesivir 100 mg in sodium chloride 0.9 % 100 mL IVPB 100 mg 200 mL/hr   06/29/20 1026 New Bag/Given   remdesivir 100 mg in sodium chloride 0.9 % 100 mL IVPB 100 mg 200 mL/hr       Subjective: Seen and examined.  No overnight events.  Still has mild pain on coughing otherwise no other symptoms.  Objective: Vitals:   06/30/20 0409 06/30/20 0500 06/30/20 0924 06/30/20 1301  BP: 126/69  (!) 158/84 (!) 114/54  Pulse: 69  67 65  Resp: 18  19 16   Temp: 98 F (36.7 C)  98.2 F (36.8 C) 97.8 F (36.6 C)  TempSrc:   Oral Oral  SpO2: 92%  97% 98%  Weight:  87.1 kg    Height:        Intake/Output Summary (Last 24 hours) at 06/30/2020 1311 Last data filed at 06/30/2020 1000 Gross per 24 hour  Intake 540 ml  Output 1500 ml  Net -960 ml   Filed Weights   06/28/20 0500 06/29/20 0700 06/30/20 0500  Weight: 87.1 kg 87 kg 87.1 kg    Examination:  Physical Exam Constitutional:      Comments: Looks comfortable.    HENT:     Head: Atraumatic.  Cardiovascular:     Rate and Rhythm: Regular rhythm.     Comments: Pacemaker in place. Pulmonary:     Comments: Bilateral equal air entry. Abdominal:     Palpations: Abdomen is soft.  Musculoskeletal:     Cervical back: Neck supple.  Neurological:     General: No focal deficit present.     Mental Status: She is oriented to person, place, and time.       Data Reviewed: I have personally reviewed following labs and imaging studies  CBC: Recent Labs  Lab 06/24/20 0835 06/26/20 0408  WBC 2.3* 3.5*  NEUTROABS 1.1* 2.3  HGB 9.9* 9.6*  HCT 31.2* 31.4*  MCV 96.6 99.4  PLT 93* 299*   Basic Metabolic Panel: Recent Labs  Lab 06/24/20 0835 06/26/20 0408 06/27/20 0355 06/28/20 0459  NA 142 141 138 139  K 3.5 4.8 5.1 5.2*  CL 105 110 108 106  CO2 26 23 24 24   GLUCOSE  87 251* 243* 288*  BUN 30* 18 26* 38*  CREATININE 1.50* 1.29* 1.31* 1.46*  CALCIUM 8.1* 8.1* 8.5* 8.9  MG 1.9 2.0  --   --   PHOS  --  3.2  --   --    GFR: Estimated Creatinine Clearance: 30.7 mL/min (A) (by C-G formula based on SCr of 1.46 mg/dL (H)). Liver Function Tests: Recent Labs  Lab 06/26/20 0408 06/27/20 0355 06/28/20 0459  AST 27 23 24   ALT 29 27 27   ALKPHOS 52 53 51  BILITOT 0.3 0.3 0.4  PROT 6.1* 6.1* 6.3*  ALBUMIN 2.8* 2.8* 2.8*   No results for input(s): LIPASE, AMYLASE in the last 168 hours. No results for input(s): AMMONIA in the last 168 hours. Coagulation Profile: No results for input(s): INR, PROTIME in the last 168 hours.  Cardiac Enzymes: No results for input(s): CKTOTAL, CKMB, CKMBINDEX, TROPONINI in the last 168 hours. BNP (last 3 results) No results for input(s): PROBNP in the last 8760 hours. HbA1C: No results for input(s): HGBA1C in the last 72 hours. CBG: Recent Labs  Lab 06/29/20 1623 06/29/20 2031 06/30/20 0413 06/30/20 0741 06/30/20 1109  GLUCAP 321* 313* 266* 238* 294*   Lipid Profile: No results for input(s): CHOL, HDL, LDLCALC, TRIG, CHOLHDL, LDLDIRECT in the last 72 hours. Thyroid Function Tests: No results for input(s): TSH, T4TOTAL, FREET4, T3FREE, THYROIDAB in the last 72 hours. Anemia Panel: No results for input(s): VITAMINB12, FOLATE, FERRITIN, TIBC, IRON, RETICCTPCT in the last 72 hours. Sepsis Labs: No results for input(s): PROCALCITON, LATICACIDVEN in the last 168 hours.  No results found for this or any previous visit (from the past 240 hour(s)).       Radiology Studies: No results found.      Scheduled Meds: . [START ON 07/01/2020] amiodarone  200 mg Oral Daily  . anastrozole  1 mg Oral Daily  . apixaban  2.5 mg Oral BID  . atorvastatin  20 mg Oral QHS  . benzonatate  100 mg Oral TID  . carvedilol  12.5 mg Oral BID WC  . cholecalciferol  1,000 Units Oral Daily  . famotidine  20 mg Oral Daily  . ferrous  sulfate  325 mg Oral Q breakfast  . insulin aspart  0-15 Units Subcutaneous TID WC  . insulin aspart  0-5 Units Subcutaneous QHS  . insulin aspart  10 Units Subcutaneous TID WC  . insulin detemir  35 Units Subcutaneous QHS  . isosorbide-hydrALAZINE  1 tablet Oral TID  . metoprolol succinate  50 mg Oral Daily  . mirabegron ER  50 mg Oral Daily  . mirtazapine  7.5 mg Oral QHS  . oxybutynin  5 mg Oral BID  . pantoprazole  40 mg Oral Daily  . polyethylene glycol  17 g Oral Daily  . [START ON 07/01/2020] predniSONE  40 mg Oral Q breakfast  . pregabalin  75 mg Oral QHS  . sodium chloride flush  3 mL Intravenous Q12H  . torsemide  20 mg Oral Daily  . vitamin B-12  250 mcg Oral Daily   Continuous Infusions:    LOS: 8 days    Time spent: 30 minutes    [name], MD [facility] Pager [phone]

## 2020-06-30 NOTE — Progress Notes (Signed)
Occupational Therapy Treatment Patient Details Name: [name] MRN: [id number] DOB: 1933/07/30 Today's Date: 06/30/2020    History of present illness Patient is a 86 y.o. female with medical history significant of breast cancer, chronic combined systolic and diastolic CHF, chronic kidney disease stage III, history of CVA, history of left bundle branch block as well as mitral regurgitation, essential hypertension diabetes who was diagnosed with COVID-19 infection 3 days PTA with fever and chills but no respiratory distress.  Patient also was sent to get antibody infusion 06/21/20.   Onset of watery diarrhea after returning home with syncopal episode.   OT comments  Pt with good participation.  Pt sats greater than 90 entire OT session on room air  Follow Up Recommendations  Home health OT;SNF (depending on progress)    Equipment Recommendations  None recommended by OT    Recommendations for Other Services      Precautions / Restrictions Precautions Precautions: Fall       Mobility Bed Mobility               General bed mobility comments: pt in chair  Transfers Overall transfer level: Needs assistance Equipment used: Rolling walker (2 wheeled) Transfers: Sit to/from [facility] Sit to Stand: Supervision Stand pivot transfers: Min guard            Balance Overall balance assessment: Needs assistance Sitting-balance support: No upper extremity supported;Feet supported Sitting balance-Leahy Scale: Good     Standing balance support: During functional activity;Bilateral upper extremity supported Standing balance-Leahy Scale: Fair                             ADL either performed or assessed with clinical judgement   ADL Overall ADL's : Needs assistance/impaired Eating/Feeding: Set up;Sitting   Grooming: Wash/dry face;Set up;Oral care;Standing                   Toilet Transfer: RW;Stand-pivot;BSC;Supervision/safety    Toileting- Clothing Manipulation and Hygiene: Minimal assistance;Sit to/from stand         General ADL Comments: pt very pleasant and motivated!     Vision Baseline Vision/History: Wears glasses Patient Visual Report: No change from baseline     Perception     Praxis      Cognition Arousal/Alertness: Awake/alert Behavior During Therapy: WFL for tasks assessed/performed                                                       Pertinent Vitals/ Pain       Pain Assessment: No/denies pain  Home Living                                              Frequency  Min 2X/week        Progress Toward Goals  OT Goals(current goals can now be found in the care plan section)  Progress towards OT goals: Progressing toward goals     Plan Discharge plan remains appropriate       AM-PAC OT "6 Clicks" Daily Activity     Outcome Measure   Help from another person eating meals?: A Little Help from another  person taking care of personal grooming?: A Little Help from another person toileting, which includes using toliet, bedpan, or urinal?: A Little Help from another person bathing (including washing, rinsing, drying)?: A Little Help from another person to put on and taking off regular upper body clothing?: A Little Help from another person to put on and taking off regular lower body clothing?: A Little 6 Click Score: 18    End of Session Equipment Utilized During Treatment: Gait belt;Rolling walker  OT Visit Diagnosis: Unsteadiness on feet (R26.81);Muscle weakness (generalized) (M62.81)   Activity Tolerance Patient tolerated treatment well   Patient Left in chair;with call bell/phone within reach;with chair alarm set   Nurse Communication Mobility status        Time: 2257-5051 OT Time Calculation (min): 28 min  Charges: OT General Charges $OT Visit: 1 Visit OT Treatments $Self Care/Home Management : 23-37 mins  Kari Baars, [place] [phone] Office- [phone]      Spyros Winch, Edwena Felty D 06/30/2020, 3:33 PM

## 2020-07-01 DIAGNOSIS — I420 Dilated cardiomyopathy: Secondary | ICD-10-CM | POA: Diagnosis not present

## 2020-07-01 DIAGNOSIS — I872 Venous insufficiency (chronic) (peripheral): Secondary | ICD-10-CM | POA: Diagnosis not present

## 2020-07-01 DIAGNOSIS — R05 Cough: Secondary | ICD-10-CM | POA: Diagnosis not present

## 2020-07-01 DIAGNOSIS — I5020 Unspecified systolic (congestive) heart failure: Secondary | ICD-10-CM | POA: Diagnosis not present

## 2020-07-01 DIAGNOSIS — E669 Obesity, unspecified: Secondary | ICD-10-CM | POA: Diagnosis present

## 2020-07-01 DIAGNOSIS — R279 Unspecified lack of coordination: Secondary | ICD-10-CM | POA: Diagnosis not present

## 2020-07-01 DIAGNOSIS — N183 Chronic kidney disease, stage 3 unspecified: Secondary | ICD-10-CM | POA: Diagnosis not present

## 2020-07-01 DIAGNOSIS — R6889 Other general symptoms and signs: Secondary | ICD-10-CM | POA: Diagnosis not present

## 2020-07-01 DIAGNOSIS — G9341 Metabolic encephalopathy: Secondary | ICD-10-CM | POA: Diagnosis not present

## 2020-07-01 DIAGNOSIS — Z743 Need for continuous supervision: Secondary | ICD-10-CM | POA: Diagnosis not present

## 2020-07-01 DIAGNOSIS — R0902 Hypoxemia: Secondary | ICD-10-CM | POA: Diagnosis not present

## 2020-07-01 DIAGNOSIS — Z66 Do not resuscitate: Secondary | ICD-10-CM | POA: Diagnosis not present

## 2020-07-01 DIAGNOSIS — Z741 Need for assistance with personal care: Secondary | ICD-10-CM | POA: Diagnosis not present

## 2020-07-01 DIAGNOSIS — C50311 Malignant neoplasm of lower-inner quadrant of right female breast: Secondary | ICD-10-CM | POA: Diagnosis not present

## 2020-07-01 DIAGNOSIS — D631 Anemia in chronic kidney disease: Secondary | ICD-10-CM | POA: Diagnosis not present

## 2020-07-01 DIAGNOSIS — E161 Other hypoglycemia: Secondary | ICD-10-CM | POA: Diagnosis not present

## 2020-07-01 DIAGNOSIS — I10 Essential (primary) hypertension: Secondary | ICD-10-CM | POA: Diagnosis not present

## 2020-07-01 DIAGNOSIS — J690 Pneumonitis due to inhalation of food and vomit: Secondary | ICD-10-CM | POA: Diagnosis not present

## 2020-07-01 DIAGNOSIS — I69351 Hemiplegia and hemiparesis following cerebral infarction affecting right dominant side: Secondary | ICD-10-CM | POA: Diagnosis not present

## 2020-07-01 DIAGNOSIS — J209 Acute bronchitis, unspecified: Secondary | ICD-10-CM | POA: Diagnosis not present

## 2020-07-01 DIAGNOSIS — R278 Other lack of coordination: Secondary | ICD-10-CM | POA: Diagnosis not present

## 2020-07-01 DIAGNOSIS — Z170 Estrogen receptor positive status [ER+]: Secondary | ICD-10-CM | POA: Diagnosis not present

## 2020-07-01 DIAGNOSIS — I130 Hypertensive heart and chronic kidney disease with heart failure and stage 1 through stage 4 chronic kidney disease, or unspecified chronic kidney disease: Secondary | ICD-10-CM | POA: Diagnosis not present

## 2020-07-01 DIAGNOSIS — A419 Sepsis, unspecified organism: Secondary | ICD-10-CM | POA: Diagnosis not present

## 2020-07-01 DIAGNOSIS — U071 COVID-19: Secondary | ICD-10-CM | POA: Diagnosis not present

## 2020-07-01 DIAGNOSIS — R410 Disorientation, unspecified: Secondary | ICD-10-CM | POA: Diagnosis not present

## 2020-07-01 DIAGNOSIS — E11649 Type 2 diabetes mellitus with hypoglycemia without coma: Secondary | ICD-10-CM | POA: Diagnosis not present

## 2020-07-01 DIAGNOSIS — Z515 Encounter for palliative care: Secondary | ICD-10-CM | POA: Diagnosis not present

## 2020-07-01 DIAGNOSIS — D649 Anemia, unspecified: Secondary | ICD-10-CM | POA: Diagnosis not present

## 2020-07-01 DIAGNOSIS — E1122 Type 2 diabetes mellitus with diabetic chronic kidney disease: Secondary | ICD-10-CM | POA: Diagnosis not present

## 2020-07-01 DIAGNOSIS — Z7189 Other specified counseling: Secondary | ICD-10-CM | POA: Diagnosis not present

## 2020-07-01 DIAGNOSIS — I5032 Chronic diastolic (congestive) heart failure: Secondary | ICD-10-CM | POA: Diagnosis not present

## 2020-07-01 DIAGNOSIS — E1159 Type 2 diabetes mellitus with other circulatory complications: Secondary | ICD-10-CM | POA: Diagnosis not present

## 2020-07-01 DIAGNOSIS — I428 Other cardiomyopathies: Secondary | ICD-10-CM | POA: Diagnosis not present

## 2020-07-01 DIAGNOSIS — R55 Syncope and collapse: Secondary | ICD-10-CM | POA: Diagnosis not present

## 2020-07-01 DIAGNOSIS — E860 Dehydration: Secondary | ICD-10-CM | POA: Diagnosis not present

## 2020-07-01 DIAGNOSIS — R2681 Unsteadiness on feet: Secondary | ICD-10-CM | POA: Diagnosis not present

## 2020-07-01 DIAGNOSIS — I471 Supraventricular tachycardia: Secondary | ICD-10-CM | POA: Diagnosis not present

## 2020-07-01 DIAGNOSIS — I447 Left bundle-branch block, unspecified: Secondary | ICD-10-CM | POA: Diagnosis not present

## 2020-07-01 DIAGNOSIS — J9691 Respiratory failure, unspecified with hypoxia: Secondary | ICD-10-CM | POA: Diagnosis not present

## 2020-07-01 DIAGNOSIS — I110 Hypertensive heart disease with heart failure: Secondary | ICD-10-CM | POA: Diagnosis not present

## 2020-07-01 DIAGNOSIS — N179 Acute kidney failure, unspecified: Secondary | ICD-10-CM | POA: Diagnosis not present

## 2020-07-01 DIAGNOSIS — I5033 Acute on chronic diastolic (congestive) heart failure: Secondary | ICD-10-CM | POA: Diagnosis not present

## 2020-07-01 DIAGNOSIS — E162 Hypoglycemia, unspecified: Secondary | ICD-10-CM | POA: Diagnosis not present

## 2020-07-01 DIAGNOSIS — I248 Other forms of acute ischemic heart disease: Secondary | ICD-10-CM | POA: Diagnosis not present

## 2020-07-01 DIAGNOSIS — Z794 Long term (current) use of insulin: Secondary | ICD-10-CM | POA: Diagnosis not present

## 2020-07-01 DIAGNOSIS — R5381 Other malaise: Secondary | ICD-10-CM | POA: Diagnosis not present

## 2020-07-01 DIAGNOSIS — R6520 Severe sepsis without septic shock: Secondary | ICD-10-CM | POA: Diagnosis not present

## 2020-07-01 DIAGNOSIS — E876 Hypokalemia: Secondary | ICD-10-CM | POA: Diagnosis not present

## 2020-07-01 DIAGNOSIS — E1165 Type 2 diabetes mellitus with hyperglycemia: Secondary | ICD-10-CM | POA: Diagnosis not present

## 2020-07-01 DIAGNOSIS — Z950 Presence of cardiac pacemaker: Secondary | ICD-10-CM | POA: Diagnosis not present

## 2020-07-01 DIAGNOSIS — N184 Chronic kidney disease, stage 4 (severe): Secondary | ICD-10-CM | POA: Diagnosis not present

## 2020-07-01 DIAGNOSIS — J90 Pleural effusion, not elsewhere classified: Secondary | ICD-10-CM | POA: Diagnosis not present

## 2020-07-01 DIAGNOSIS — J208 Acute bronchitis due to other specified organisms: Secondary | ICD-10-CM | POA: Diagnosis not present

## 2020-07-01 DIAGNOSIS — R778 Other specified abnormalities of plasma proteins: Secondary | ICD-10-CM | POA: Diagnosis not present

## 2020-07-01 DIAGNOSIS — M6259 Muscle wasting and atrophy, not elsewhere classified, multiple sites: Secondary | ICD-10-CM | POA: Diagnosis not present

## 2020-07-01 DIAGNOSIS — Z23 Encounter for immunization: Secondary | ICD-10-CM | POA: Diagnosis not present

## 2020-07-01 DIAGNOSIS — E119 Type 2 diabetes mellitus without complications: Secondary | ICD-10-CM | POA: Diagnosis not present

## 2020-07-01 DIAGNOSIS — R1312 Dysphagia, oropharyngeal phase: Secondary | ICD-10-CM | POA: Diagnosis not present

## 2020-07-01 DIAGNOSIS — J9601 Acute respiratory failure with hypoxia: Secondary | ICD-10-CM | POA: Diagnosis not present

## 2020-07-01 DIAGNOSIS — M6281 Muscle weakness (generalized): Secondary | ICD-10-CM | POA: Diagnosis not present

## 2020-07-01 DIAGNOSIS — I69321 Dysphasia following cerebral infarction: Secondary | ICD-10-CM | POA: Diagnosis not present

## 2020-07-01 DIAGNOSIS — N1832 Chronic kidney disease, stage 3b: Secondary | ICD-10-CM | POA: Diagnosis not present

## 2020-07-01 DIAGNOSIS — N1831 Chronic kidney disease, stage 3a: Secondary | ICD-10-CM | POA: Diagnosis not present

## 2020-07-01 DIAGNOSIS — Z8616 Personal history of COVID-19: Secondary | ICD-10-CM | POA: Diagnosis not present

## 2020-07-01 DIAGNOSIS — I5042 Chronic combined systolic (congestive) and diastolic (congestive) heart failure: Secondary | ICD-10-CM | POA: Diagnosis not present

## 2020-07-01 LAB — GLUCOSE, CAPILLARY
Glucose-Capillary: 209 mg/dL — ABNORMAL HIGH (ref 70–99)
Glucose-Capillary: 172 mg/dL — ABNORMAL HIGH (ref 70–99)
Glucose-Capillary: 177 mg/dL — ABNORMAL HIGH (ref 70–99)

## 2020-07-01 MED ORDER — INSULIN DETEMIR 100 UNIT/ML ~~LOC~~ SOLN: 35.0000 [IU] | Freq: Every day | SUBCUTANEOUS | 11 refills | Status: DC

## 2020-07-01 MED ORDER — TRAMADOL HCL 50 MG PO TABS
50.0000 mg | ORAL_TABLET | Freq: Two times a day (BID) | ORAL | 0 refills | Status: AC | PRN
Start: 2020-07-01 — End: 2020-07-06

## 2020-07-01 NOTE — Progress Notes (Signed)
Attempted to call report x2. No answer and unable to leave a voicemail. Will try again after [facility]AR picks up patient.

## 2020-07-01 NOTE — Care Management Important Message (Signed)
Important Message  Patient Details IM Letter given to the Patient Name: [name] MRN: [id number] Date of Birth: 06-10-1933   Medicare Important Message Given:  Yes     Kerin Salen 07/01/2020, 11:42 AM

## 2020-07-01 NOTE — Progress Notes (Signed)
Third attempt to call report to [facility] place. Abigail Butts stated that she would give a message to the oncoming nurse to call me back.

## 2020-07-01 NOTE — Discharge Summary (Addendum)
Physician Discharge Summary  [name] WIO:[id number] DOB: 02-20-1933 DOA: 06/22/2020  PCP: [name], MD  Admit date: 06/22/2020 Discharge date: 07/01/2020  Admitted From: Home Disposition: Skilled nursing facility  Recommendations for Outpatient Follow-up:  1. Follow up with PCP in 1-2 weeks, please schedule follow-up with cardiology. 2. Please obtain BMP/CBC in one week  Home Health: Not applicable Equipment/Devices: Not needed  Discharge Condition: Stable CODE STATUS: Full code Diet recommendation: Low-salt, low-carb diet  Discharge summary: 86 year old female with multiple medical issues history of breast cancer on anastrozole, SVT status post ablation with permanent pacemaker, chronic combined heart failure, chronic kidney disease stage IIIb, history of a stroke and recurrent DVT on Eliquis who was diagnosed with COVID-19 about 3 days prior to arrival.  She was started on antibiotic infusion which she received, however started having diarrhea, felt very weak and drowsy so came to the ER.  After admission to the hospital she started coughing and wheezing with repeat chest x-ray consistent with COVID-19 pneumonia so treated with Covid directed therapy.  Now patient is clinically improved.  Treated for multiple medical conditions that are explained below as a bullet points.  Acute syncopal event in the setting of hypotension and dehydration: Treated with IV fluids with improvement. Diarrhea resolved.  Clinically improving. Did not have any evidence of pacemaker malfunction.  COVID-19 pneumonia: Initially with minimal symptoms.  Subsequently developed more cough and congestion with evidence of lung involvement. Patient with improved symptoms.  She has some dry cough and some left-sided rib pain that has improved today. Currently on room air. Finished all therapeutics.  Continue supportive treatment at nursing home.  SpO2: 98 %   Recent Labs       Lab Results  Component  Value Date   SARSCOV2NAA POSITIVE (A) 06/19/2020   [place] NEGATIVE 08/20/2019   [place] NEGATIVE 08/10/2019   SARSCOV2NAA NOT DETECTED 07/22/2019     Insulin-dependent diabetes mellitus: Poorly controlled at home with A1c of 9.  Blood sugars elevated.   Discontinuing steroids today.  Resume home dose of insulin including long-acting insulin and Ozempic.  Chronic kidney disease stage IIIb: Stable at baseline.  SVT status post pacemaker: Stable.  Patient on amiodarone and normal sinus rhythm.  Therapeutic on Eliquis. Patient is on amiodarone and Coreg that is continued. Patient is also on metoprolol XL along with Coreg, could not verify that she is taking both beta-blockers.  Cardiologist office note indicates Coreg and amiodarone.  Discontinue metoprolol XL.  Chronic pain syndrome: On Lyrica.  Patient uses tramadol.  Prescriptions provided.  Chronic left bundle branch block: Stable at baseline.  History of DVT: On Eliquis  Chronic combined heart failure: Euvolemic.  Hypokalemia: Replaced and normalized.  Torsemide resumed.   Patient has multiple medical issues but is stabilizing today.  She is a stable to transfer to skilled level of care to continue inpatient therapies before returning home with family.  07/17/2020 648PM:This addendum done only to satisfy diagnosis clarification. Sepsis ruled out    Discharge Diagnoses:  Principal Problem:   Syncope and collapse Active Problems:   SVT (supraventricular tachycardia) (HCC)   Acute on chronic diastolic CHF (congestive heart failure) (HCC)   CKD (chronic kidney disease), stage III (HCC)   LBBB (left bundle branch block)   DM (diabetes mellitus) ([place])   Malignant neoplasm of lower-inner quadrant of right breast of female, estrogen receptor positive ([place])   COVID-19 virus infection   Hypokalemia    Discharge Instructions  Discharge Instructions  Diet - low sodium heart healthy   Complete by: As  directed    Diet Carb Modified   Complete by: As directed    Increase activity slowly   Complete by: As directed      Allergies as of 07/01/2020      Reactions   Aspirin Itching   Penicillins Itching, Rash   DID THE REACTION INVOLVE: Swelling of the face/tongue/throat, SOB, or low BP? Yes Sudden or severe rash/hives, skin peeling, or the inside of the mouth or nose? No Did it require medical treatment? Yes When did it last happen?"More than 10 years ago" If all above answers are "NO", may proceed with cephalosporin use.      Medication List    STOP taking these medications   Accu-Chek Aviva Plus test strip Generic drug: glucose blood   Accu-Chek Aviva Plus w/Device Kit   Accu-Chek Softclix Lancets lancets   BD Pen Needle Nano 2nd Gen 32G X 4 MM Misc Generic drug: Insulin Pen Needle   Levemir FlexTouch 100 UNIT/ML FlexPen Generic drug: insulin detemir Replaced by: insulin detemir 100 UNIT/ML injection   metoprolol succinate 50 MG 24 hr tablet Commonly known as: TOPROL-XL   potassium chloride SA 20 MEQ tablet Commonly known as: KLOR-CON     TAKE these medications   acetaminophen 500 MG tablet Commonly known as: TYLENOL Take 1,000 mg by mouth every 6 (six) hours as needed for mild pain, moderate pain or headache.   amiodarone 200 MG tablet Commonly known as: PACERONE Take one tablet by mouth daily Monday-Saturday. Do NOT take Sunday   anastrozole 1 MG tablet Commonly known as: ARIMIDEX TAKE 1 TABLET(1 MG) BY MOUTH DAILY What changed: See the new instructions.   apixaban 2.5 MG Tabs tablet Commonly known as: Eliquis Take 1 tablet (2.5 mg total) by mouth 2 (two) times daily.   atorvastatin 20 MG tablet Commonly known as: LIPITOR Take 1 tablet (20 mg total) by mouth at bedtime.   B-12 PO Take 1 tablet by mouth daily after breakfast.   BiDil 20-37.5 MG tablet Generic drug: isosorbide-hydrALAZINE Take 1 tablet by mouth 3 (three) times daily.   carvedilol  12.5 MG tablet Commonly known as: COREG Take 1 tablet (12.5 mg total) by mouth 2 (two) times daily with a meal. What changed: See the new instructions.   diclofenac sodium 1 % Gel Commonly known as: Voltaren Apply 2 g topically 4 (four) times daily. What changed:   when to take this  reasons to take this   famotidine 40 MG tablet Commonly known as: PEPCID Take 40 mg by mouth 2 (two) times daily.   ferrous sulfate 325 (65 FE) MG tablet Take 325 mg by mouth daily with breakfast.   HumaLOG KwikPen 200 UNIT/ML KwikPen Generic drug: insulin lispro INJECT 3 UNITS UNDER THE SKIN BEFORE BREAKFAST, LUNCH, DINNER IF SUGARS ARE GREATER THAN 175. What changed: See the new instructions.   insulin detemir 100 UNIT/ML injection Commonly known as: LEVEMIR Inject 0.35 mLs (35 Units total) into the skin at bedtime. Replaces: Levemir FlexTouch 100 UNIT/ML FlexPen   mirtazapine 7.5 MG tablet Commonly known as: REMERON TAKE 1 TABLET(7.5 MG) BY MOUTH AT BEDTIME What changed: See the new instructions.   Myrbetriq 50 MG Tb24 tablet Generic drug: mirabegron ER Take 50 mg by mouth daily.   omeprazole 40 MG capsule Commonly known as: PRILOSEC TAKE 1 CAPSULE BY MOUTH DAILY BEFORE A MEAL What changed:   how much to take  how to  take this  when to take this  additional instructions   oxybutynin 5 MG tablet Commonly known as: DITROPAN TAKE 1 TABLET(5 MG) BY MOUTH TWICE DAILY What changed: See the new instructions.   Ozempic (0.25 or 0.5 MG/DOSE) 2 MG/1.5ML Sopn Generic drug: Semaglutide(0.25 or 0.5MG/DOS) Inject 0.25 mg into the skin once a week. Take on Wednesday   pregabalin 75 MG capsule Commonly known as: LYRICA Take 1 capsule (75 mg total) by mouth at bedtime.   torsemide 20 MG tablet Commonly known as: DEMADEX Take 1 tablet (20 mg total) by mouth 2 (two) times daily. You may take an extra 20 mg tablet daily as needed for swelling   traMADol 50 MG tablet Commonly known  as: ULTRAM Take 1 tablet (50 mg total) by mouth every 12 (twelve) hours as needed for up to 5 days for moderate pain or severe pain.   VITAMIN D-3 PO Take 1 capsule by mouth daily after breakfast.       Contact information for follow-up providers    [name], MD Follow up.   Specialty: Internal Medicine Contact information: [address] [place] [address] [phone]            Contact information for after-discharge care    Destination    [facility] Preferred SNF .   Service: Skilled Nursing Contact information: [address] [place] [phone]                 Allergies  Allergen Reactions  . Aspirin Itching  . Penicillins Itching and Rash    DID THE REACTION INVOLVE: Swelling of the face/tongue/throat, SOB, or low BP? Yes Sudden or severe rash/hives, skin peeling, or the inside of the mouth or nose? No Did it require medical treatment? Yes When did it last happen?"More than 10 years ago" If all above answers are "NO", may proceed with cephalosporin use.     Consultations:  None   Procedures/Studies: DG CHEST PORT 1 VIEW  Result Date: 06/25/2020 CLINICAL DATA:  History of breast cancer. Heart failure, kidney failure. COVID-19 positive EXAM: PORTABLE CHEST 1 VIEW COMPARISON:  06/22/2020 FINDINGS: Decreased lung volume. Progression of mild bibasilar airspace disease which could be atelectasis or pneumonia. No effusion. Dual lead pacemaker unchanged. Chronic rotator cuff impingement/rotator cuff tear. IMPRESSION: Progression of mild bibasilar airspace disease which may be atelectasis or pneumonia. Electronically Signed   By: Franchot Gallo M.D.   On: 06/25/2020 17:05   DG Chest Port 1 View  Result Date: 06/22/2020 CLINICAL DATA:  COVID positive, weakness EXAM: PORTABLE CHEST 1 VIEW COMPARISON:  06/20/2020 FINDINGS: Chronic mild interstitial prominence. No new consolidation. No pleural  effusion. Stable cardiomediastinal contours. Left chest wall pacemaker. IMPRESSION: No significant change since 06/20/2020. Electronically Signed   By: Macy Mis M.D.   On: 06/22/2020 16:29   DG Chest Port 1 View  Result Date: 06/20/2020 CLINICAL DATA:  Cough and congestion EXAM: PORTABLE CHEST 1 VIEW COMPARISON:  08/19/2019 FINDINGS: Left-sided pacing device. Mild cardiomegaly with linear scarring at the left base. No consolidation or effusion. No pneumothorax. IMPRESSION: No active disease. Mild cardiomegaly. Electronically Signed   By: Donavan Foil M.D.   On: 06/20/2020 00:30   ECHOCARDIOGRAM COMPLETE  Result Date: 06/23/2020    ECHOCARDIOGRAM REPORT   Patient Name:   [name] Date of Exam: 06/23/2020 Medical Rec #:  [id number]    Height:       65.0 in Accession #:  [id number]   Weight:       192.0 lb Date of Birth:  [birth year]    BSA:          1.944 m Patient Age:    85 years     BP:           97/55 mmHg Patient Gender: F            HR:           64 bpm. Exam Location:  Inpatient Procedure: 2D Echo, Cardiac Doppler and Color Doppler Indications:    R55 Syncope  History:        Patient has prior history of Echocardiogram examinations, most                 recent 07/22/2019. CHF, Pacemaker, Stroke, Arrythmias:LBBB; Risk                 Factors:Diabetes. Cancer. CKD. COVID-19 Positive.  Sonographer:    Jonelle Sidle Dance Referring Phys: [place]  1. Left ventricular ejection fraction, by estimation, is 30%. The left ventricle has moderate to severely decreased function. The left ventricle demonstrates global hypokinesis with septal-lateral dyssynchrony. There is severe left ventricular hypertrophy. Left ventricular diastolic parameters are consistent with Grade I diastolic dysfunction (impaired relaxation).  2. Right ventricular systolic function is mildly reduced. The right ventricular size is normal. Tricuspid regurgitation signal is inadequate for assessing PA pressure.  3.  Left atrial size was severely dilated.  4. The mitral valve is normal in structure. Trivial mitral valve regurgitation. No evidence of mitral stenosis.  5. The aortic valve is tricuspid. Aortic valve regurgitation is not visualized. Mild aortic valve sclerosis is present, with no evidence of aortic valve stenosis.  6. Aortic dilatation noted. There is mild dilatation of the aortic root measuring 39 mm.  7. The inferior vena cava is dilated in size with <50% respiratory variability, suggesting right atrial pressure of 15 mmHg. FINDINGS  Left Ventricle: Left ventricular ejection fraction, by estimation, is 30%. The left ventricle has moderate to severely decreased function. The left ventricle demonstrates global hypokinesis. The left ventricular internal cavity size was normal in size. There is severe left ventricular hypertrophy. Left ventricular diastolic parameters are consistent with Grade I diastolic dysfunction (impaired relaxation). Right Ventricle: The right ventricular size is normal. No increase in right ventricular wall thickness. Right ventricular systolic function is mildly reduced. Tricuspid regurgitation signal is inadequate for assessing PA pressure. Left Atrium: Left atrial size was severely dilated. Right Atrium: Right atrial size was normal in size. Pericardium: Trivial pericardial effusion is present. Mitral Valve: The mitral valve is normal in structure. Trivial mitral valve regurgitation. No evidence of mitral valve stenosis. Tricuspid Valve: The tricuspid valve is normal in structure. Tricuspid valve regurgitation is trivial. Aortic Valve: The aortic valve is tricuspid. Aortic valve regurgitation is not visualized. Mild aortic valve sclerosis is present, with no evidence of aortic valve stenosis. Pulmonic Valve: The pulmonic valve was normal in structure. Pulmonic valve regurgitation is not visualized. Aorta: Aortic dilatation noted. There is mild dilatation of the aortic root measuring 39 mm.  Venous: The inferior vena cava is dilated in size with less than 50% respiratory variability, suggesting right atrial pressure of 15 mmHg. IAS/Shunts: No atrial level shunt detected by color flow Doppler.  LEFT VENTRICLE PLAX 2D LVIDd:         5.00 cm  Diastology LVIDs:         3.20 cm  LV  e' lateral:   4.35 cm/s LV PW:         1.20 cm  LV E/e' lateral: 11.8 LV IVS:        1.20 cm  LV e' medial:    3.48 cm/s LVOT diam:     2.30 cm  LV E/e' medial:  14.8 LV SV:         74 LV SV Index:   38 LVOT Area:     4.15 cm  RIGHT VENTRICLE            IVC RV Basal diam:  2.70 cm    IVC diam: 2.40 cm RV S prime:     9.36 cm/s TAPSE (M-mode): 1.2 cm LEFT ATRIUM              Index       RIGHT ATRIUM          Index LA diam:        5.40 cm  2.78 cm/m  RA Area:     6.15 cm LA Vol (A2C):   98.9 ml  50.87 ml/m RA Volume:   7.56 ml  3.89 ml/m LA Vol (A4C):   151.0 ml 77.67 ml/m LA Biplane Vol: 125.0 ml 64.29 ml/m  AORTIC VALVE LVOT Vmax:   83.50 cm/s LVOT Vmean:  57.400 cm/s LVOT VTI:    0.177 m  AORTA Ao Root diam: 3.90 cm Ao Asc diam:  3.40 cm MITRAL VALVE MV Area (PHT): 2.69 cm    SHUNTS MV Decel Time: 282 msec    Systemic VTI:  0.18 m MV E velocity: 51.40 cm/s  Systemic Diam: 2.30 cm MV A velocity: 52.80 cm/s MV E/A ratio:  0.97 Loralie Champagne MD Electronically signed by Loralie Champagne MD Signature Date/Time: 06/23/2020/5:51:38 PM    Final    (Echo, Carotid, EGD, Colonoscopy, ERCP)    Subjective: Patient seen and examined.  Today she is without any issues.  Sometimes it hurts on her left lower rib cage when she coughs.  Otherwise no other complaints.  No more diarrhea.  Appetite is fair.   Discharge Exam: Vitals:   07/01/20 0942 07/01/20 1012  BP: 116/84 (!) 142/70  Pulse: 67 63  Resp:  18  Temp:    SpO2:  100%   Vitals:   07/01/20 0026 07/01/20 0412 07/01/20 0942 07/01/20 1012  BP: 130/65 136/70 116/84 (!) 142/70  Pulse: 65 65 67 63  Resp: '20 20  18  ' Temp: 98.6 F (37 C) 98 F (36.7 C)    TempSrc:       SpO2: 100% 100%  100%  Weight:      Height:        General: Pt is alert, awake, not in acute distress On room air.  Chronically sick looking but not in any distress.  Age-appropriate. Cardiovascular: RRR, S1/S2 +, no rubs, no gallops, pacemaker on left precordium. Respiratory: CTA bilaterally, no wheezing, no rhonchi Abdominal: Soft, NT, ND, bowel sounds + Extremities: no edema, no cyanosis    The results of significant diagnostics from this hospitalization (including imaging, microbiology, ancillary and laboratory) are listed below for reference.     Microbiology: No results found for this or any previous visit (from the past 240 hour(s)).   Labs: BNP (last 3 results) Recent Labs    08/10/19 1441  BNP 151.7*   Basic Metabolic Panel: Recent Labs  Lab 06/26/20 0408 06/27/20 0355 06/28/20 0459  NA 141 138 139  K 4.8 5.1 5.2*  CL 110 108 106  CO2 '23 24 24  ' GLUCOSE 251* 243* 288*  BUN 18 26* 38*  CREATININE 1.29* 1.31* 1.46*  CALCIUM 8.1* 8.5* 8.9  MG 2.0  --   --   PHOS 3.2  --   --    Liver Function Tests: Recent Labs  Lab 06/26/20 0408 06/27/20 0355 06/28/20 0459  AST '27 23 24  ' ALT '29 27 27  ' ALKPHOS 52 53 51  BILITOT 0.3 0.3 0.4  PROT 6.1* 6.1* 6.3*  ALBUMIN 2.8* 2.8* 2.8*   No results for input(s): LIPASE, AMYLASE in the last 168 hours. No results for input(s): AMMONIA in the last 168 hours. CBC: Recent Labs  Lab 06/26/20 0408  WBC 3.5*  NEUTROABS 2.3  HGB 9.6*  HCT 31.4*  MCV 99.4  PLT 118*   Cardiac Enzymes: No results for input(s): CKTOTAL, CKMB, CKMBINDEX, TROPONINI in the last 168 hours. BNP: Invalid input(s): POCBNP CBG: Recent Labs  Lab 06/30/20 1639 06/30/20 2130 07/01/20 0451 07/01/20 0800 07/01/20 1129  GLUCAP 259* 268* 209* 172* 177*   D-Dimer No results for input(s): DDIMER in the last 72 hours. Hgb A1c No results for input(s): HGBA1C in the last 72 hours. Lipid Profile No results for input(s): CHOL, HDL, LDLCALC,  TRIG, CHOLHDL, LDLDIRECT in the last 72 hours. Thyroid function studies No results for input(s): TSH, T4TOTAL, T3FREE, THYROIDAB in the last 72 hours.  Invalid input(s): FREET3 Anemia work up No results for input(s): VITAMINB12, FOLATE, FERRITIN, TIBC, IRON, RETICCTPCT in the last 72 hours. Urinalysis    Component Value Date/Time   COLORURINE YELLOW 06/23/2020 0446   APPEARANCEUR CLEAR 06/23/2020 0446   LABSPEC 1.009 06/23/2020 0446   PHURINE 5.0 06/23/2020 0446   GLUCOSEU NEGATIVE 06/23/2020 0446   HGBUR NEGATIVE 06/23/2020 0446   BILIRUBINUR NEGATIVE 06/23/2020 0446   BILIRUBINUR negative 11/28/2019 1206   KETONESUR NEGATIVE 06/23/2020 0446   PROTEINUR NEGATIVE 06/23/2020 0446   UROBILINOGEN 0.2 11/28/2019 1206   UROBILINOGEN 0.2 10/12/2012 2037   NITRITE NEGATIVE 06/23/2020 0446   LEUKOCYTESUR NEGATIVE 06/23/2020 0446   Sepsis Labs Invalid input(s): PROCALCITONIN,  WBC,  LACTICIDVEN Microbiology No results found for this or any previous visit (from the past 240 hour(s)).   Time coordinating discharge: 45 minutes  SIGNED:   [name], MD  [facility] 07/01/2020, 11:50 AM

## 2020-07-01 NOTE — TOC Progression Note (Signed)
Transition of Care [facility]) - Progression Note    Patient Details  Name: [name] MRN: [id number] Date of Birth: [birth year]  [facility]) CM/SW [place], [place] Phone Number: 07/01/2020, 9:01 AM  Clinical Narrative:   Received call back from PCP office late yesterday afternoon stating refill would be sent to pharmacy.  Called daughter this AM.  She will follow up and let me know where things stand. TOC will continue to follow during the course of hospitalization.     Expected Discharge Plan: Skilled Nursing Facility Barriers to Discharge: SNF Pending bed offer  Expected Discharge Plan and Services Expected Discharge Plan: [place]   Discharge Planning Services: CM Consult Post Acute Care Choice: [place] Living arrangements for the past 2 months: Apartment                                       Social Determinants of Health (SDOH) Interventions    Readmission Risk Interventions Readmission Risk Prevention Plan 01/03/2019 01/02/2019  Transportation Screening (No Data) Complete  PCP or Specialist Appt within 3-5 Days - Complete  HRI or [place] - Complete  Social Work Consult for [place] Planning/Counseling - Complete  Palliative Care Screening - Not Applicable  Medication Review Press photographer) - Complete  Some recent data might be hidden

## 2020-07-01 NOTE — TOC Transition Note (Signed)
Transition of Care [facility]) - CM/SW Discharge Note   Patient Details  Name: [name] MRN: [id number] Date of Birth: 26-Aug-1933  [facility]) CM/SW Contact:  Trish Mage, LCSW Phone Number: 07/01/2020, 11:06 AM   Clinical Narrative:  Daughter able to get meds today and will deliver to [facility].  I called [employer] for updated reference number since  Her authorization expired yesterday, and I was informed that there is a 24 hour grace period, so as long as she transfers today, reference number remains the same. PTAR arranged. Nursing, please call report to [phone], room 802A. TOC sign off.    Final next level of care: [place] Barriers to Discharge: Barriers Resolved   Patient Goals and CMS Choice Patient states their goals for this hospitalization and ongoing recovery are:: "I [facility]'t want to [facility] to [facility].gov Compare Post Acute Care list provided to:: Patient Choice offered to / list presented to : Patient, Adult Children  Discharge Placement                       Discharge Plan and Services   Discharge Planning Services: CM Consult Post Acute Care Choice: [place]                               Social Determinants of Health (SDOH) Interventions     Readmission Risk Interventions Readmission Risk Prevention Plan 01/03/2019 01/02/2019  Transportation Screening (No Data) Complete  PCP or Specialist Appt within 3-5 Days - Complete  HRI or [place] - Complete  Social Work Consult for [place] Planning/Counseling - Complete  Palliative Care Screening - Not Applicable  Medication Review Press photographer) - Complete  Some recent data might be hidden

## 2020-07-02 ENCOUNTER — Ambulatory Visit: Payer: Medicare Other

## 2020-07-02 DIAGNOSIS — E1122 Type 2 diabetes mellitus with diabetic chronic kidney disease: Secondary | ICD-10-CM

## 2020-07-02 DIAGNOSIS — R5381 Other malaise: Secondary | ICD-10-CM | POA: Diagnosis not present

## 2020-07-02 DIAGNOSIS — I5033 Acute on chronic diastolic (congestive) heart failure: Secondary | ICD-10-CM | POA: Diagnosis not present

## 2020-07-02 DIAGNOSIS — I5032 Chronic diastolic (congestive) heart failure: Secondary | ICD-10-CM

## 2020-07-02 DIAGNOSIS — I110 Hypertensive heart disease with heart failure: Secondary | ICD-10-CM | POA: Diagnosis not present

## 2020-07-02 DIAGNOSIS — E1159 Type 2 diabetes mellitus with other circulatory complications: Secondary | ICD-10-CM | POA: Diagnosis not present

## 2020-07-02 DIAGNOSIS — U071 COVID-19: Secondary | ICD-10-CM

## 2020-07-02 NOTE — Chronic Care Management (AMB) (Signed)
  Care Management    Consult Note  07/02/2020 Name: MAHARI VANKIRK MRN: [id number] DOB: 1933-05-09  Care management team received notification of patient's recent inpatient hospitalization related to COVID 19 .Based on review of health record, DEVETTA HAGENOW is currently active in the embedded care coordination program.   Review of patient status, including review of consultants reports, relevant laboratory and other test results, and collaboration with appropriate care team members and the patient's provider was performed as part of comprehensive patient evaluation and provision of chronic care management services.    SW collaboration with [facility] Liaison who reported plans of patient to discharge to [facility] for rehab. SW placed a successful outbound call to the patients daughter to assess for care coordination needs. [name] reports the patient is settled at [facility] with no acute needs at this time. Encouraged [name] to contact SW as needed to assist with care coordination needs.   Plan: SW will follow up with patient by phone over the next month.  [name], BSW, CDP Social Worker, Certified Dementia Practitioner [place] / [place] Management [phone]

## 2020-07-06 ENCOUNTER — Ambulatory Visit: Payer: Medicare Other | Admitting: Internal Medicine

## 2020-07-06 ENCOUNTER — Telehealth: Payer: Self-pay

## 2020-07-06 NOTE — Telephone Encounter (Signed)
Spoke to Ms. Small the pt's daughter and she confirmed that the pt is still at [facility] place because she was discharged from the hospital she had covid and pneumonia so the pt will not be coming to her appt today.

## 2020-07-07 ENCOUNTER — Ambulatory Visit: Payer: Medicare Other | Admitting: Internal Medicine

## 2020-07-08 DIAGNOSIS — I10 Essential (primary) hypertension: Secondary | ICD-10-CM | POA: Diagnosis not present

## 2020-07-08 DIAGNOSIS — Z950 Presence of cardiac pacemaker: Secondary | ICD-10-CM | POA: Diagnosis not present

## 2020-07-10 DIAGNOSIS — E1159 Type 2 diabetes mellitus with other circulatory complications: Secondary | ICD-10-CM | POA: Diagnosis not present

## 2020-07-10 DIAGNOSIS — M6259 Muscle wasting and atrophy, not elsewhere classified, multiple sites: Secondary | ICD-10-CM | POA: Diagnosis not present

## 2020-07-10 DIAGNOSIS — R5381 Other malaise: Secondary | ICD-10-CM | POA: Diagnosis not present

## 2020-07-12 ENCOUNTER — Other Ambulatory Visit: Payer: Self-pay | Admitting: Physician Assistant

## 2020-07-13 ENCOUNTER — Emergency Department (HOSPITAL_COMMUNITY): Payer: Medicare Other

## 2020-07-13 ENCOUNTER — Other Ambulatory Visit: Payer: Self-pay

## 2020-07-13 ENCOUNTER — Ambulatory Visit: Payer: Medicare Other

## 2020-07-13 ENCOUNTER — Inpatient Hospital Stay (HOSPITAL_COMMUNITY)
Admission: EM | Admit: 2020-07-13 | Discharge: 2020-07-20 | DRG: 871 | Disposition: A | Discharge status: Skilled Nursing Facility | Payer: Medicare Other | Attending: Internal Medicine | Admitting: Internal Medicine

## 2020-07-13 DIAGNOSIS — N184 Chronic kidney disease, stage 4 (severe): Secondary | ICD-10-CM

## 2020-07-13 DIAGNOSIS — G9341 Metabolic encephalopathy: Secondary | ICD-10-CM | POA: Diagnosis not present

## 2020-07-13 DIAGNOSIS — U071 COVID-19: Secondary | ICD-10-CM

## 2020-07-13 DIAGNOSIS — I6930 Unspecified sequelae of cerebral infarction: Secondary | ICD-10-CM | POA: Diagnosis not present

## 2020-07-13 DIAGNOSIS — R778 Other specified abnormalities of plasma proteins: Secondary | ICD-10-CM | POA: Diagnosis not present

## 2020-07-13 DIAGNOSIS — N183 Chronic kidney disease, stage 3 unspecified: Secondary | ICD-10-CM | POA: Diagnosis not present

## 2020-07-13 DIAGNOSIS — I5042 Chronic combined systolic (congestive) and diastolic (congestive) heart failure: Secondary | ICD-10-CM | POA: Diagnosis present

## 2020-07-13 DIAGNOSIS — J90 Pleural effusion, not elsewhere classified: Secondary | ICD-10-CM | POA: Diagnosis not present

## 2020-07-13 DIAGNOSIS — R0902 Hypoxemia: Secondary | ICD-10-CM

## 2020-07-13 DIAGNOSIS — I10 Essential (primary) hypertension: Secondary | ICD-10-CM | POA: Diagnosis not present

## 2020-07-13 DIAGNOSIS — R0600 Dyspnea, unspecified: Secondary | ICD-10-CM

## 2020-07-13 DIAGNOSIS — I5032 Chronic diastolic (congestive) heart failure: Secondary | ICD-10-CM

## 2020-07-13 DIAGNOSIS — Z515 Encounter for palliative care: Secondary | ICD-10-CM | POA: Diagnosis not present

## 2020-07-13 DIAGNOSIS — R918 Other nonspecific abnormal finding of lung field: Secondary | ICD-10-CM | POA: Diagnosis not present

## 2020-07-13 DIAGNOSIS — Z7189 Other specified counseling: Secondary | ICD-10-CM | POA: Diagnosis not present

## 2020-07-13 DIAGNOSIS — E669 Obesity, unspecified: Secondary | ICD-10-CM | POA: Diagnosis present

## 2020-07-13 DIAGNOSIS — E11649 Type 2 diabetes mellitus with hypoglycemia without coma: Secondary | ICD-10-CM | POA: Diagnosis present

## 2020-07-13 DIAGNOSIS — J690 Pneumonitis due to inhalation of food and vomit: Secondary | ICD-10-CM | POA: Diagnosis not present

## 2020-07-13 DIAGNOSIS — J208 Acute bronchitis due to other specified organisms: Secondary | ICD-10-CM

## 2020-07-13 DIAGNOSIS — Z7901 Long term (current) use of anticoagulants: Secondary | ICD-10-CM

## 2020-07-13 DIAGNOSIS — N179 Acute kidney failure, unspecified: Secondary | ICD-10-CM | POA: Diagnosis not present

## 2020-07-13 DIAGNOSIS — Z86718 Personal history of other venous thrombosis and embolism: Secondary | ICD-10-CM | POA: Diagnosis not present

## 2020-07-13 DIAGNOSIS — N1832 Chronic kidney disease, stage 3b: Secondary | ICD-10-CM | POA: Diagnosis present

## 2020-07-13 DIAGNOSIS — I5040 Unspecified combined systolic (congestive) and diastolic (congestive) heart failure: Secondary | ICD-10-CM | POA: Diagnosis not present

## 2020-07-13 DIAGNOSIS — R6520 Severe sepsis without septic shock: Secondary | ICD-10-CM | POA: Diagnosis present

## 2020-07-13 DIAGNOSIS — I428 Other cardiomyopathies: Secondary | ICD-10-CM | POA: Diagnosis present

## 2020-07-13 DIAGNOSIS — E161 Other hypoglycemia: Secondary | ICD-10-CM | POA: Diagnosis not present

## 2020-07-13 DIAGNOSIS — A419 Sepsis, unspecified organism: Secondary | ICD-10-CM | POA: Diagnosis not present

## 2020-07-13 DIAGNOSIS — Z853 Personal history of malignant neoplasm of breast: Secondary | ICD-10-CM

## 2020-07-13 DIAGNOSIS — J209 Acute bronchitis, unspecified: Secondary | ICD-10-CM | POA: Diagnosis not present

## 2020-07-13 DIAGNOSIS — I5041 Acute combined systolic (congestive) and diastolic (congestive) heart failure: Secondary | ICD-10-CM

## 2020-07-13 DIAGNOSIS — I130 Hypertensive heart and chronic kidney disease with heart failure and stage 1 through stage 4 chronic kidney disease, or unspecified chronic kidney disease: Secondary | ICD-10-CM | POA: Diagnosis present

## 2020-07-13 DIAGNOSIS — E1169 Type 2 diabetes mellitus with other specified complication: Secondary | ICD-10-CM

## 2020-07-13 DIAGNOSIS — C50311 Malignant neoplasm of lower-inner quadrant of right female breast: Secondary | ICD-10-CM | POA: Diagnosis not present

## 2020-07-13 DIAGNOSIS — E860 Dehydration: Secondary | ICD-10-CM | POA: Diagnosis not present

## 2020-07-13 DIAGNOSIS — E1122 Type 2 diabetes mellitus with diabetic chronic kidney disease: Secondary | ICD-10-CM

## 2020-07-13 DIAGNOSIS — Z823 Family history of stroke: Secondary | ICD-10-CM

## 2020-07-13 DIAGNOSIS — J9601 Acute respiratory failure with hypoxia: Secondary | ICD-10-CM | POA: Diagnosis not present

## 2020-07-13 DIAGNOSIS — R5381 Other malaise: Secondary | ICD-10-CM

## 2020-07-13 DIAGNOSIS — N1831 Chronic kidney disease, stage 3a: Secondary | ICD-10-CM | POA: Diagnosis not present

## 2020-07-13 DIAGNOSIS — Z8249 Family history of ischemic heart disease and other diseases of the circulatory system: Secondary | ICD-10-CM

## 2020-07-13 DIAGNOSIS — Z993 Dependence on wheelchair: Secondary | ICD-10-CM

## 2020-07-13 DIAGNOSIS — I5033 Acute on chronic diastolic (congestive) heart failure: Secondary | ICD-10-CM | POA: Diagnosis present

## 2020-07-13 DIAGNOSIS — E118 Type 2 diabetes mellitus with unspecified complications: Secondary | ICD-10-CM | POA: Diagnosis not present

## 2020-07-13 DIAGNOSIS — R05 Cough: Secondary | ICD-10-CM | POA: Diagnosis not present

## 2020-07-13 DIAGNOSIS — Z66 Do not resuscitate: Secondary | ICD-10-CM | POA: Diagnosis not present

## 2020-07-13 DIAGNOSIS — R7989 Other specified abnormal findings of blood chemistry: Secondary | ICD-10-CM | POA: Diagnosis present

## 2020-07-13 DIAGNOSIS — I872 Venous insufficiency (chronic) (peripheral): Secondary | ICD-10-CM | POA: Diagnosis not present

## 2020-07-13 DIAGNOSIS — I447 Left bundle-branch block, unspecified: Secondary | ICD-10-CM | POA: Diagnosis not present

## 2020-07-13 DIAGNOSIS — D631 Anemia in chronic kidney disease: Secondary | ICD-10-CM | POA: Diagnosis not present

## 2020-07-13 DIAGNOSIS — I69351 Hemiplegia and hemiparesis following cerebral infarction affecting right dominant side: Secondary | ICD-10-CM

## 2020-07-13 DIAGNOSIS — I5020 Unspecified systolic (congestive) heart failure: Secondary | ICD-10-CM | POA: Diagnosis not present

## 2020-07-13 DIAGNOSIS — I69321 Dysphasia following cerebral infarction: Secondary | ICD-10-CM

## 2020-07-13 DIAGNOSIS — R410 Disorientation, unspecified: Secondary | ICD-10-CM | POA: Diagnosis not present

## 2020-07-13 DIAGNOSIS — Z794 Long term (current) use of insulin: Secondary | ICD-10-CM

## 2020-07-13 DIAGNOSIS — Z743 Need for continuous supervision: Secondary | ICD-10-CM | POA: Diagnosis not present

## 2020-07-13 DIAGNOSIS — C50919 Malignant neoplasm of unspecified site of unspecified female breast: Secondary | ICD-10-CM | POA: Diagnosis not present

## 2020-07-13 DIAGNOSIS — Z23 Encounter for immunization: Secondary | ICD-10-CM | POA: Diagnosis not present

## 2020-07-13 DIAGNOSIS — Z950 Presence of cardiac pacemaker: Secondary | ICD-10-CM | POA: Diagnosis not present

## 2020-07-13 DIAGNOSIS — Z6831 Body mass index (BMI) 31.0-31.9, adult: Secondary | ICD-10-CM

## 2020-07-13 DIAGNOSIS — Z7401 Bed confinement status: Secondary | ICD-10-CM | POA: Diagnosis not present

## 2020-07-13 DIAGNOSIS — I420 Dilated cardiomyopathy: Secondary | ICD-10-CM | POA: Diagnosis not present

## 2020-07-13 DIAGNOSIS — R109 Unspecified abdominal pain: Secondary | ICD-10-CM

## 2020-07-13 DIAGNOSIS — E119 Type 2 diabetes mellitus without complications: Secondary | ICD-10-CM

## 2020-07-13 DIAGNOSIS — Z8616 Personal history of COVID-19: Secondary | ICD-10-CM

## 2020-07-13 DIAGNOSIS — Z79899 Other long term (current) drug therapy: Secondary | ICD-10-CM

## 2020-07-13 DIAGNOSIS — M2550 Pain in unspecified joint: Secondary | ICD-10-CM | POA: Diagnosis not present

## 2020-07-13 DIAGNOSIS — E876 Hypokalemia: Secondary | ICD-10-CM | POA: Diagnosis present

## 2020-07-13 DIAGNOSIS — E1165 Type 2 diabetes mellitus with hyperglycemia: Secondary | ICD-10-CM | POA: Diagnosis not present

## 2020-07-13 DIAGNOSIS — I248 Other forms of acute ischemic heart disease: Secondary | ICD-10-CM | POA: Diagnosis not present

## 2020-07-13 DIAGNOSIS — Z880 Allergy status to penicillin: Secondary | ICD-10-CM

## 2020-07-13 DIAGNOSIS — Z886 Allergy status to analgesic agent status: Secondary | ICD-10-CM

## 2020-07-13 DIAGNOSIS — E162 Hypoglycemia, unspecified: Secondary | ICD-10-CM | POA: Diagnosis not present

## 2020-07-13 DIAGNOSIS — R55 Syncope and collapse: Secondary | ICD-10-CM | POA: Diagnosis not present

## 2020-07-13 DIAGNOSIS — J9691 Respiratory failure, unspecified with hypoxia: Secondary | ICD-10-CM | POA: Diagnosis not present

## 2020-07-13 DIAGNOSIS — Z170 Estrogen receptor positive status [ER+]: Secondary | ICD-10-CM | POA: Diagnosis not present

## 2020-07-13 DIAGNOSIS — E785 Hyperlipidemia, unspecified: Secondary | ICD-10-CM | POA: Diagnosis present

## 2020-07-13 LAB — PROTIME-INR
INR: 1.1 (ref 0.8–1.2)
Prothrombin Time: 13.6 seconds (ref 11.4–15.2)

## 2020-07-13 LAB — CBG MONITORING, ED
Glucose-Capillary: 75 mg/dL (ref 70–99)
Glucose-Capillary: 105 mg/dL — ABNORMAL HIGH (ref 70–99)
Glucose-Capillary: 306 mg/dL — ABNORMAL HIGH (ref 70–99)

## 2020-07-13 LAB — COMPREHENSIVE METABOLIC PANEL
ALT: 22 U/L (ref 0–44)
AST: 27 U/L (ref 15–41)
Albumin: 2.8 g/dL — ABNORMAL LOW (ref 3.5–5.0)
Alkaline Phosphatase: 62 U/L (ref 38–126)
Anion gap: 11 (ref 5–15)
BUN: 19 mg/dL (ref 8–23)
CO2: 29 mmol/L (ref 22–32)
Calcium: 8.3 mg/dL — ABNORMAL LOW (ref 8.9–10.3)
Chloride: 104 mmol/L (ref 98–111)
Creatinine, Ser: 1.71 mg/dL — ABNORMAL HIGH (ref 0.44–1.00)
GFR calc Af Amer: 31 mL/min — ABNORMAL LOW (ref >60)
GFR calc non Af Amer: 27 mL/min — ABNORMAL LOW (ref >60)
Glucose, Bld: 71 mg/dL (ref 70–99)
Potassium: 3.1 mmol/L — ABNORMAL LOW (ref 3.5–5.1)
Sodium: 144 mmol/L (ref 135–145)
Total Bilirubin: 0.5 mg/dL (ref 0.3–1.2)
Total Protein: 6.5 g/dL (ref 6.5–8.1)

## 2020-07-13 LAB — CBC WITH DIFFERENTIAL/PLATELET
Abs Immature Granulocytes: 0.02 10*3/uL (ref 0.00–0.07)
Basophils Absolute: 0 10*3/uL (ref 0.0–0.1)
Basophils Relative: 0 %
Eosinophils Absolute: 0.2 10*3/uL (ref 0.0–0.5)
Eosinophils Relative: 5 %
HCT: 32.2 % — ABNORMAL LOW (ref 36.0–46.0)
Hemoglobin: 9.9 g/dL — ABNORMAL LOW (ref 12.0–15.0)
Immature Granulocytes: 1 %
Lymphocytes Relative: 13 %
Lymphs Abs: 0.6 10*3/uL — ABNORMAL LOW (ref 0.7–4.0)
MCH: 30.1 pg (ref 26.0–34.0)
MCHC: 30.7 g/dL (ref 30.0–36.0)
MCV: 97.9 fL (ref 80.0–100.0)
Monocytes Absolute: 0.5 10*3/uL (ref 0.1–1.0)
Monocytes Relative: 13 %
Neutro Abs: 2.8 10*3/uL (ref 1.7–7.7)
Neutrophils Relative %: 68 %
Platelets: 111 10*3/uL — ABNORMAL LOW (ref 150–400)
RBC: 3.29 MIL/uL — ABNORMAL LOW (ref 3.87–5.11)
RDW: 14.6 % (ref 11.5–15.5)
WBC: 4.2 10*3/uL (ref 4.0–10.5)
nRBC: 0 % (ref 0.0–0.2)

## 2020-07-13 LAB — SARS CORONAVIRUS 2 BY RT PCR (HOSPITAL ORDER, PERFORMED IN ~~LOC~~ HOSPITAL LAB): SARS Coronavirus 2: POSITIVE — AB

## 2020-07-13 LAB — I-STAT VENOUS BLOOD GAS, ED
Acid-Base Excess: 11 mmol/L — ABNORMAL HIGH (ref 0.0–2.0)
Bicarbonate: 33 mmol/L — ABNORMAL HIGH (ref 20.0–28.0)
Calcium, Ion: 1 mmol/L — ABNORMAL LOW (ref 1.15–1.40)
HCT: 29 % — ABNORMAL LOW (ref 36.0–46.0)
Hemoglobin: 9.9 g/dL — ABNORMAL LOW (ref 12.0–15.0)
O2 Saturation: 99 %
Potassium: 3.2 mmol/L — ABNORMAL LOW (ref 3.5–5.1)
Sodium: 144 mmol/L (ref 135–145)
TCO2: 34 mmol/L — ABNORMAL HIGH (ref 22–32)
pCO2, Ven: 34.2 mmHg — ABNORMAL LOW (ref 44.0–60.0)
pH, Ven: 7.593 — ABNORMAL HIGH (ref 7.250–7.430)
pO2, Ven: 125 mmHg — ABNORMAL HIGH (ref 32.0–45.0)

## 2020-07-13 LAB — URINALYSIS, ROUTINE W REFLEX MICROSCOPIC
Bilirubin Urine: NEGATIVE
Glucose, UA: NEGATIVE mg/dL
Hgb urine dipstick: NEGATIVE
Ketones, ur: NEGATIVE mg/dL
Nitrite: NEGATIVE
Protein, ur: NEGATIVE mg/dL
Specific Gravity, Urine: 1.01 (ref 1.005–1.030)
pH: 5 (ref 5.0–8.0)

## 2020-07-13 LAB — LACTIC ACID, PLASMA
Lactic Acid, Venous: 0.8 mmol/L (ref 0.5–1.9)
Lactic Acid, Venous: 1.1 mmol/L (ref 0.5–1.9)
Lactic Acid, Venous: 1.7 mmol/L (ref 0.5–1.9)
Lactic Acid, Venous: 0.5 mmol/L (ref 0.5–1.9)

## 2020-07-13 LAB — TROPONIN I (HIGH SENSITIVITY)
Troponin I (High Sensitivity): 41 ng/L — ABNORMAL HIGH (ref <18)
Troponin I (High Sensitivity): 52 ng/L — ABNORMAL HIGH (ref <18)
Troponin I (High Sensitivity): 466 ng/L (ref <18)

## 2020-07-13 LAB — BRAIN NATRIURETIC PEPTIDE: B Natriuretic Peptide: 678.9 pg/mL — ABNORMAL HIGH (ref 0.0–100.0)

## 2020-07-13 MED ORDER — IPRATROPIUM BROMIDE HFA 17 MCG/ACT IN AERS
2.0000 | INHALATION_SPRAY | Freq: Once | RESPIRATORY_TRACT | Status: AC
  Administered 2020-07-13: 2 via RESPIRATORY_TRACT
  Filled 2020-07-13: qty 12.9

## 2020-07-13 MED ORDER — ATORVASTATIN CALCIUM 10 MG PO TABS
20.0000 mg | ORAL_TABLET | Freq: Every day | ORAL | Status: DC
  Administered 2020-07-14 – 2020-07-20 (×8): 20 mg via ORAL
  Filled 2020-07-13 (×8): qty 2

## 2020-07-13 MED ORDER — HYDROCODONE-ACETAMINOPHEN 5-325 MG PO TABS: 1.0000 | ORAL_TABLET | ORAL | Status: DC | PRN

## 2020-07-13 MED ORDER — SODIUM CHLORIDE 0.9 % IV SOLN: 1.0000 g | INTRAVENOUS | Status: DC

## 2020-07-13 MED ORDER — ACETAMINOPHEN 650 MG RE SUPP: 650.0000 mg | Freq: Four times a day (QID) | RECTAL | Status: DC | PRN

## 2020-07-13 MED ORDER — ALBUTEROL SULFATE HFA 108 (90 BASE) MCG/ACT IN AERS: 2.0000 | INHALATION_SPRAY | RESPIRATORY_TRACT | 0 refills | Status: DC | PRN

## 2020-07-13 MED ORDER — GLUCOSE 40 % PO GEL
1.0000 | Freq: Once | ORAL | Status: AC
  Administered 2020-07-13: 37.5 g via ORAL
  Filled 2020-07-13: qty 1

## 2020-07-13 MED ORDER — ACETAMINOPHEN 325 MG PO TABS
650.0000 mg | ORAL_TABLET | Freq: Once | ORAL | Status: AC
  Administered 2020-07-13: 650 mg via ORAL
  Filled 2020-07-13: qty 2

## 2020-07-13 MED ORDER — FAMOTIDINE 20 MG PO TABS
10.0000 mg | ORAL_TABLET | Freq: Every day | ORAL | Status: DC
  Administered 2020-07-14: 10 mg via ORAL
  Filled 2020-07-13: qty 1

## 2020-07-13 MED ORDER — SODIUM CHLORIDE 0.9% FLUSH: 3.0000 mL | INTRAVENOUS | Status: DC | PRN

## 2020-07-13 MED ORDER — AMIODARONE HCL 200 MG PO TABS
200.0000 mg | ORAL_TABLET | Freq: Every day | ORAL | Status: DC
  Administered 2020-07-13 – 2020-07-20 (×8): 200 mg via ORAL
  Filled 2020-07-13 (×8): qty 1

## 2020-07-13 MED ORDER — FAMOTIDINE 20 MG PO TABS: 40.0000 mg | ORAL_TABLET | Freq: Two times a day (BID) | ORAL | Status: DC

## 2020-07-13 MED ORDER — ALBUTEROL SULFATE HFA 108 (90 BASE) MCG/ACT IN AERS
2.0000 | INHALATION_SPRAY | Freq: Once | RESPIRATORY_TRACT | Status: AC
  Administered 2020-07-13: 2 via RESPIRATORY_TRACT
  Filled 2020-07-13: qty 6.7

## 2020-07-13 MED ORDER — ONDANSETRON HCL 4 MG PO TABS: 4.0000 mg | ORAL_TABLET | Freq: Four times a day (QID) | ORAL | Status: DC | PRN

## 2020-07-13 MED ORDER — APIXABAN 2.5 MG PO TABS
2.5000 mg | ORAL_TABLET | Freq: Two times a day (BID) | ORAL | Status: DC
  Filled 2020-07-13: qty 1

## 2020-07-13 MED ORDER — SODIUM CHLORIDE 0.9 % IV SOLN
500.0000 mg | Freq: Once | INTRAVENOUS | Status: AC
  Administered 2020-07-13: 500 mg via INTRAVENOUS
  Filled 2020-07-13: qty 500

## 2020-07-13 MED ORDER — CARVEDILOL 3.125 MG PO TABS: 12.5000 mg | ORAL_TABLET | Freq: Two times a day (BID) | ORAL | Status: DC

## 2020-07-13 MED ORDER — SODIUM CHLORIDE 0.9 % IV SOLN: 500.0000 mg | INTRAVENOUS | Status: DC

## 2020-07-13 MED ORDER — BENZONATATE 100 MG PO CAPS: 100.0000 mg | ORAL_CAPSULE | Freq: Three times a day (TID) | ORAL | 0 refills | Status: DC

## 2020-07-13 MED ORDER — PREGABALIN 75 MG PO CAPS
75.0000 mg | ORAL_CAPSULE | Freq: Every day | ORAL | Status: DC
  Administered 2020-07-14 – 2020-07-20 (×8): 75 mg via ORAL
  Filled 2020-07-13 (×5): qty 1
  Filled 2020-07-13: qty 3
  Filled 2020-07-13 (×2): qty 1

## 2020-07-13 MED ORDER — ANASTROZOLE 1 MG PO TABS
1.0000 mg | ORAL_TABLET | Freq: Every day | ORAL | Status: DC
  Administered 2020-07-14: 1 mg via ORAL
  Filled 2020-07-13: qty 1

## 2020-07-13 MED ORDER — FERROUS SULFATE 325 (65 FE) MG PO TABS
325.0000 mg | ORAL_TABLET | Freq: Every day | ORAL | Status: DC
  Administered 2020-07-14 – 2020-07-20 (×7): 325 mg via ORAL
  Filled 2020-07-13 (×7): qty 1

## 2020-07-13 MED ORDER — BENZONATATE 100 MG PO CAPS
100.0000 mg | ORAL_CAPSULE | Freq: Once | ORAL | Status: AC
  Administered 2020-07-13: 100 mg via ORAL
  Filled 2020-07-13: qty 1

## 2020-07-13 MED ORDER — IPRATROPIUM BROMIDE HFA 17 MCG/ACT IN AERS: 2.0000 | INHALATION_SPRAY | Freq: Four times a day (QID) | RESPIRATORY_TRACT | 12 refills | Status: DC | PRN

## 2020-07-13 MED ORDER — ACETAMINOPHEN 325 MG PO TABS
650.0000 mg | ORAL_TABLET | Freq: Four times a day (QID) | ORAL | Status: DC | PRN
  Administered 2020-07-14 – 2020-07-16 (×3): 650 mg via ORAL
  Filled 2020-07-13 (×3): qty 2

## 2020-07-13 MED ORDER — SODIUM CHLORIDE 0.9 % IV SOLN: 250.0000 mL | INTRAVENOUS | Status: DC | PRN

## 2020-07-13 MED ORDER — HEPARIN (PORCINE) 25000 UT/250ML-% IV SOLN
1000.0000 [IU]/h | INTRAVENOUS | Status: DC
  Administered 2020-07-14: 1000 [IU]/h via INTRAVENOUS
  Filled 2020-07-13: qty 250

## 2020-07-13 MED ORDER — SODIUM CHLORIDE 0.9 % IV SOLN
1.0000 g | Freq: Once | INTRAVENOUS | Status: AC
  Administered 2020-07-13: 1 g via INTRAVENOUS
  Filled 2020-07-13: qty 10

## 2020-07-13 MED ORDER — SODIUM CHLORIDE 0.9% FLUSH
3.0000 mL | Freq: Two times a day (BID) | INTRAVENOUS | Status: DC
  Administered 2020-07-14: 3 mL via INTRAVENOUS

## 2020-07-13 MED ORDER — INSULIN ASPART 100 UNIT/ML ~~LOC~~ SOLN
0.0000 [IU] | SUBCUTANEOUS | Status: DC
  Administered 2020-07-14: 2 [IU] via SUBCUTANEOUS

## 2020-07-13 MED ORDER — POTASSIUM CHLORIDE CRYS ER 20 MEQ PO TBCR
40.0000 meq | EXTENDED_RELEASE_TABLET | Freq: Once | ORAL | Status: AC
  Administered 2020-07-13: 40 meq via ORAL
  Filled 2020-07-13: qty 2

## 2020-07-13 MED ORDER — ONDANSETRON HCL 4 MG/2ML IJ SOLN: 4.0000 mg | Freq: Four times a day (QID) | INTRAMUSCULAR | Status: DC | PRN

## 2020-07-13 MED ORDER — SODIUM CHLORIDE 0.9 % IV BOLUS
500.0000 mL | Freq: Once | INTRAVENOUS | Status: AC
  Administered 2020-07-13: 500 mL via INTRAVENOUS

## 2020-07-13 NOTE — Chronic Care Management (AMB) (Signed)
  Care Management    Consult Note  07/13/2020 Name: [name] MRN: [id number] DOB: Sep 11, 1933  Care management team received notification of patient's current disposition in the emergency department related to hypoglycemia. Based on review of health record, [name] is currently active in the embedded care coordination program.   Review of patient status, including review of consultants reports, relevant laboratory and other test results, and collaboration with appropriate care team members and the patient's provider was performed as part of comprehensive patient evaluation and provision of chronic care management services.    SW collaboration with RN Care Manager to inform of patient current disposition.    Plan: SW will continue to follow for updates.  [name], BSW, CDP Social Worker, Certified Dementia Practitioner [place] / [place] Management [phone]

## 2020-07-13 NOTE — Discharge Instructions (Addendum)
1.  Monitor blood sugars every 6 hours for the next 24 hours.  Be sure to eat within 15 minutes of taking your regular insulin dose. 2.  You appear to have bronchitis after Covid.  At this time, your chest x-ray is not showing pneumonia.  You are not currently being started on antibiotics.  You may have cough for prolonged period of time after having Covid.  Use the incentive spirometer as instructed in the emergency department every 2 hours while you are awake.  Use the albuterol inhaler 2 puffs every 4-6 hours for the next 48 hours then as needed.  Use the Atrovent inhaler every 6 hours for the next 48 hours then continue if your cough has not significantly improved. 3.  Return to emergency department immediately if you develop a fever you are becoming short of breath, your cough is worsening or other concerning symptoms develop.

## 2020-07-13 NOTE — ED Notes (Signed)
Attempted x 2 to give report to [facility]

## 2020-07-13 NOTE — ED Notes (Signed)
RN walked past room and pt had emesis on floor. Room smelled of feces. Pt had vomited and was covered in large brown soft stool. RN cleaned pt and PTAR arrived. Vitals showed sats in 76% on room air. RN advised charge nurse. Pt to go to 14 when ready. Placed on 3 L [place]. RN discussed with providers.

## 2020-07-13 NOTE — ED Triage Notes (Signed)
Pt arrives from [facility], staffed called for unresponsiveness for unknown amount of time. CBG 50, given 12.5g D10. 90% SpO2 room air. CBG up to 340, alert and moving extremities on arrival to ED.

## 2020-07-13 NOTE — ED Provider Notes (Signed)
[facility] EMERGENCY DEPARTMENT Provider Note   CSN: [id number] Arrival date & time: 07/13/20  3557     History Chief Complaint  Patient presents with  . Hypoglycemia    [name] is a 86 y.o. female.  HPI Patient is brought from [facility] for change mental status this morning.  Reportedly they tried to arouse the patient this morning and could not get her to wake up.  They had reported to EMS CBG in the 200s.  Review of MAR indicates that patient was given 3 units of Humalog this morning.  We did not find a documented a.m. CBG.  EMS put the patient in the vehicle for transport and rechecked CBG.  They found CBG to be 50 mg.  Patient was given a amp of D50.  At the time of arriving to the emergency department, she started to become more responsive and alert.  They report her respiratory status was stable throughout.  She was breathing despite decreased mental status.  Respiratory rate rate was reported at 30.  She does have some rhonchi.  Patient reportedly in nursing care for post Covid care.  Patient is awake and answering questions.  She denies any areas of focal pain.  Patient does endorse frequent cough.  She reports is sometimes productive of yellow sputum.  Patient's granddaughter reports that it had gotten better as she was recovering from Covid but then over the past few days to week seems to be coming back again.  Patient has not been experiencing any fever or vomiting.    Past Medical History:  Diagnosis Date  . Abnormal echocardiogram    a. possible mass on echo 05/2019 on PPM, b. no evidence of mass / atrial lead vegetation on follow up echo 06/2019  . Anemia   . Arthritis   . Breast cancer ([place]) 10/2018   Invasive ductal carcinoma  . Cancer ([place])   . Cataract   . Chronic combined systolic and diastolic CHF (congestive heart failure) ([place])    a. Previously diastolic but patient AVS from outside hospital listed systolic CHF and cardiomyopathy,  records pending.  . CKD (chronic kidney disease), stage III   . Clotting disorder ([place])   . CVA (cerebral vascular accident) ([place])    residual right sided weakness and mild dysphagia  . Diabetes mellitus ([place])   . Dizziness and giddiness   . DVT (deep venous thrombosis) (HCC)    a. on anticoagulation for this.  . Essential hypertension   . LBBB (left bundle branch block)   . Mitral regurgitation    a. Mod MR by echo 2014.  . Muscular deconditioning   . Obesity   . Pacemaker   . Pericardial effusion   . SVT (supraventricular tachycardia) ([place])    a. In 2013 she had an EPS with ablation for SVT which did not eliminate the SVT completely. She also had bradycardia which limited medication. She was placed on amiodarone by Dr. Lovena Le.  . Thrombocytopenia ([place]) 11/21/2011    Patient Active Problem List   Diagnosis Date Noted  . Syncope and collapse 06/22/2020  . COVID-19 virus infection 06/22/2020  . Hypokalemia 06/22/2020  . Pacemaker 05/06/2020  . Syncope 08/10/2019  . Displaced fracture of lateral malleolus of right fibula, initial encounter for closed fracture 07/10/2019  . Pain in left shoulder 07/10/2019  . Acute CHF (congestive heart failure) ([place]) 06/09/2019  . Acute pulmonary edema (HCC)   . Personal history of breast cancer 04/22/2019  .  Pressure ulcer 01/04/2019  . Acute pancreatitis 01/02/2019  . Malignant neoplasm of lower-inner quadrant of right breast of female, estrogen receptor positive ([place]) 11/23/2018  . Hematoma of right iliopsoas muscle 11/06/2018  . Intramuscular hematoma 11/06/2018  . Hypertensive heart disease with heart failure ([place]) 09/05/2017  . Aphasia 08/31/2017  . Dysphagia 08/31/2017  . CKD (chronic kidney disease), stage III ([place])   . LBBB (left bundle branch block)   . Anemia   . HTN (hypertension)   . Obesity   . DVT (deep venous thrombosis) ([place])   . DM (diabetes mellitus) ([place]) 07/28/2015  . Fall 07/28/2015  . Warfarin-induced coagulopathy  ([place]) 07/28/2015  . Acute on chronic diastolic CHF (congestive heart failure) ([place]) 03/19/2014  . Edema 08/06/2013  . SVT (supraventricular tachycardia) ([place]) 10/15/2012  . First degree AV block 10/15/2012  . Acute renal insufficiency 10/14/2012  . Dizziness and giddiness   . Thrombocytopenia ([place]) 11/21/2011    Past Surgical History:  Procedure Laterality Date  . EYE SURGERY    . SUPRAVENTRICULAR TACHYCARDIA ABLATION N/A 10/11/2012   Procedure: SUPRAVENTRICULAR TACHYCARDIA ABLATION;  Surgeon: Evans Lance, MD;  Location: [facility] CATH LAB;  Service: Cardiovascular;  Laterality: N/A;     OB History   No obstetric history on file.     Family History  Problem Relation Age of Onset  . Stroke Mother   . Cancer Father        prostate  . Hypertension Daughter   . Heart attack Brother        x2  . Hypertension Brother   . Stroke Sister   . Hypertension Sister   . Breast cancer Maternal Aunt     Social History   Tobacco Use  . Smoking status: Never Smoker  . Smokeless tobacco: Never Used  Vaping Use  . Vaping Use: Never used  Substance Use Topics  . Alcohol use: No  . Drug use: No    Home Medications Prior to Admission medications   Medication Sig Start Date End Date Taking? Authorizing Provider  acetaminophen (TYLENOL) 500 MG tablet Take 1,000 mg by mouth every 6 (six) hours as needed for mild pain, moderate pain or headache.     [provider]  albuterol (VENTOLIN HFA) 108 (90 Base) MCG/ACT inhaler Inhale 2 puffs into the lungs every 4 (four) hours as needed for wheezing or shortness of breath. 07/13/20   Charlesetta Shanks, MD  amiodarone (PACERONE) 200 MG tablet Take one tablet by mouth daily Monday-Saturday. Do NOT take Sunday 05/06/20   Evans Lance, MD  anastrozole (ARIMIDEX) 1 MG tablet TAKE 1 TABLET(1 MG) BY MOUTH DAILY Patient taking differently: Take 1 mg by mouth daily.  01/15/20   Nicholas Lose, MD  atorvastatin (LIPITOR) 20 MG tablet Take 1 tablet (20  mg total) by mouth at bedtime. 06/14/19   [name], MD  benzonatate (TESSALON) 100 MG capsule Take 1 capsule (100 mg total) by mouth every 8 (eight) hours. Swallow capsule whole.  Do not chew 07/13/20   Charlesetta Shanks, MD  carvedilol (COREG) 12.5 MG tablet Take 1 tablet (12.5 mg total) by mouth 2 (two) times daily with a meal. 06/30/20   [name], MD  Cholecalciferol (VITAMIN D-3 PO) Take 1 capsule by mouth daily after breakfast.    [provider]  Cyanocobalamin (B-12 PO) Take 1 tablet by mouth daily after breakfast.     [provider]  diclofenac sodium (VOLTAREN) 1 % GEL Apply 2 g topically 4 (  four) times daily. Patient taking differently: Apply 2 g topically 4 (four) times daily as needed (pain).  10/30/18   Rodriguez-[place], Sunday Spillers, PA-C  ELIQUIS 2.5 MG TABS tablet TAKE 1 TABLET(2.5 MG) BY MOUTH TWICE DAILY 07/13/20   Dunn, Nedra Hai, PA-C  famotidine (PEPCID) 40 MG tablet Take 40 mg by mouth 2 (two) times daily. 05/22/20   [provider]  ferrous sulfate 325 (65 FE) MG tablet Take 325 mg by mouth daily with breakfast.    [provider]  HUMALOG KWIKPEN 200 UNIT/ML KwikPen INJECT 3 UNITS UNDER THE SKIN BEFORE BREAKFAST, LUNCH, DINNER IF SUGARS ARE GREATER THAN 175. Patient taking differently: Inject 3 Units into the skin 3 (three) times daily as needed (Blood Sugar > 175).  02/11/20   [name], MD  insulin detemir (LEVEMIR) 100 UNIT/ML injection Inject 0.35 mLs (35 Units total) into the skin at bedtime. 07/01/20   [name], MD  ipratropium (ATROVENT HFA) 17 MCG/ACT inhaler Inhale 2 puffs into the lungs every 6 (six) hours as needed for wheezing. 07/13/20   Charlesetta Shanks, MD  isosorbide-hydrALAZINE (BIDIL) 20-37.5 MG tablet Take 1 tablet by mouth 3 (three) times daily. 02/28/20   Jettie Booze, MD  mirtazapine (REMERON) 7.5 MG tablet TAKE 1 TABLET(7.5 MG) BY MOUTH AT BEDTIME Patient taking differently: Take 7.5 mg by mouth at bedtime.   04/14/20   [name], MD  MYRBETRIQ 50 MG TB24 tablet Take 50 mg by mouth daily. 05/24/20   [provider]  omeprazole (PRILOSEC) 40 MG capsule TAKE 1 CAPSULE BY MOUTH DAILY BEFORE A MEAL Patient taking differently: Take 40 mg by mouth in the morning, at noon, and at bedtime.  12/11/19   [name], MD  oxybutynin (DITROPAN) 5 MG tablet TAKE 1 TABLET(5 MG) BY MOUTH TWICE DAILY Patient taking differently: Take 5 mg by mouth 2 (two) times daily.  12/23/19   [name], MD  pregabalin (LYRICA) 75 MG capsule Take 1 capsule (75 mg total) by mouth at bedtime. 06/30/20   Minette Brine, FNP  Semaglutide,0.25 or 0.5MG /DOS, (OZEMPIC, 0.25 OR 0.5 MG/DOSE,) 2 MG/1.5ML SOPN Inject 0.25 mg into the skin once a week. Take on Wednesday    [provider]  torsemide (DEMADEX) 20 MG tablet Take 1 tablet (20 mg total) by mouth 2 (two) times daily. You may take an extra 20 mg tablet daily as needed for swelling 01/14/20   Jettie Booze, MD    Allergies    Aspirin and Penicillins  Review of Systems   Review of Systems 10 systems reviewed and negative except as per HPI Physical Exam Updated Vital Signs BP (!) 192/85 (BP Location: Left Arm)   Pulse 93   Temp 98.6 F (37 C) (Oral)   Resp (!) 25   Ht 5\' 5"  (1.651 m)   Wt 87 kg   SpO2 90%   BMI 31.92 kg/m   Physical Exam Constitutional:      Comments: On arrival, patient is talking and interactive.  No respiratory distress at rest, occasional moist sounding cough.  HENT:     Head: Normocephalic and atraumatic.     Mouth/Throat:     Pharynx: Oropharynx is clear.  Eyes:     Extraocular Movements: Extraocular movements intact.  Cardiovascular:     Rate and Rhythm: Normal rate and regular rhythm.     Pulses: Normal pulses.  Pulmonary:     Comments: No respiratory distress.  Occasional wet cough.  Occasional scattered wheeze with  adequate airflow to the bases. Abdominal:     General: There is no distension.      Palpations: Abdomen is soft.     Tenderness: There is no abdominal tenderness. There is no guarding.  Musculoskeletal:     Comments: Calves are soft nontender.  1+ swelling around the ankles.  Skin changes consistent with chronic venous stasis.  Legs appear less swollen than historically is skin is slightly loose and pliable.  Hyperpigmentation changes of lower legs.  Feet are warm and dry.  Skin:    General: Skin is warm and dry.  Neurological:     General: No focal deficit present.     Mental Status: She is oriented to person, place, and time.     Cranial Nerves: No cranial nerve deficit.     Coordination: Coordination normal.     Comments: Patient is alert and answering questions.  No focal motor deficits.  Follows commands for grip strength and moving extremities.  Psychiatric:        Mood and Affect: Mood normal.     ED Results / Procedures / Treatments   Labs (all labs ordered are listed, but only abnormal results are displayed) Labs Reviewed  SARS CORONAVIRUS 2 BY RT PCR ([place] [place] [facility]) - Abnormal; Notable for the following components:      Result Value   SARS Coronavirus 2 POSITIVE (*)    All other components within normal limits  COMPREHENSIVE METABOLIC PANEL - Abnormal; Notable for the following components:   Potassium 3.1 (*)    Creatinine, Ser 1.71 (*)    Calcium 8.3 (*)    Albumin 2.8 (*)    GFR calc non Af Amer 27 (*)    GFR calc Af Amer 31 (*)    All other components within normal limits  CBC WITH DIFFERENTIAL/PLATELET - Abnormal; Notable for the following components:   RBC 3.29 (*)    Hemoglobin 9.9 (*)    HCT 32.2 (*)    Platelets 111 (*)    Lymphs Abs 0.6 (*)    All other components within normal limits  URINALYSIS, ROUTINE W REFLEX MICROSCOPIC - Abnormal; Notable for the following components:   APPearance CLOUDY (*)    Leukocytes,Ua MODERATE (*)    Bacteria, UA RARE (*)    All other components within normal limits   I-STAT VENOUS BLOOD GAS, ED - Abnormal; Notable for the following components:   pH, Ven 7.593 (*)    pCO2, Ven 34.2 (*)    pO2, Ven 125.0 (*)    Bicarbonate 33.0 (*)    TCO2 34 (*)    Acid-Base Excess 11.0 (*)    Potassium 3.2 (*)    Calcium, Ion 1.00 (*)    HCT 29.0 (*)    Hemoglobin 9.9 (*)    All other components within normal limits  CBG MONITORING, ED - Abnormal; Notable for the following components:   Glucose-Capillary 105 (*)    All other components within normal limits  TROPONIN I (HIGH SENSITIVITY) - Abnormal; Notable for the following components:   Troponin I (High Sensitivity) 41 (*)    All other components within normal limits  TROPONIN I (HIGH SENSITIVITY) - Abnormal; Notable for the following components:   Troponin I (High Sensitivity) 52 (*)    All other components within normal limits  URINE CULTURE  LACTIC ACID, PLASMA  LACTIC ACID, PLASMA  PROTIME-INR  CBG MONITORING, ED    EKG EKG Interpretation  Date/Time:  Monday July 13 2020 09:26:54 EDT Ventricular Rate:  81 PR Interval:    QRS Duration: 166 QT Interval:  487 QTC Calculation: 566 R Axis:   -64 Text Interpretation: Sinus rhythm Nonspecific IVCD with LAD LVH with secondary repolarization abnormality old LBBB. no sig change from previous. Confirmed by Charlesetta Shanks [id number]) on 07/13/2020 1:15:24 PM   Radiology CT Head Wo Contrast  Result Date: 07/13/2020 CLINICAL DATA:  Mental status change. EXAM: CT HEAD WITHOUT CONTRAST TECHNIQUE: Contiguous axial images were obtained from the base of the skull through the vertex without intravenous contrast. COMPARISON:  08/10/2019 FINDINGS: Brain: No evidence for acute hemorrhage, mass lesion, midline shift, hydrocephalus or large infarct. Small calcifications in the basal ganglia. Vascular: No hyperdense vessel or unexpected calcification. Skull: Normal. Negative for fracture or focal lesion. Sinuses/Orbits: Mucosal thickening in the maxillary sinuses, right  side greater than left. Mild mucosal thickening involving the left frontal sinus and ethmoid air cells. Other: None IMPRESSION: 1. No acute intracranial abnormality. 2. Mild paranasal sinus disease. Electronically Signed   By: Markus Daft M.D.   On: 07/13/2020 10:45   DG Chest Port 1 View  Result Date: 07/13/2020 CLINICAL DATA:  Unresponsive and cough. EXAM: PORTABLE CHEST 1 VIEW COMPARISON:  06/25/2020 FINDINGS: Stable appearance of the left dual chamber cardiac pacemaker. Heart size is upper limits of normal but stable. Evidence for a soft tissue Mach line overlying the left upper chest. No evidence to suggest a pneumothorax. No focal airspace disease or lung consolidation. Trachea is deviated towards the right and this is unchanged. IMPRESSION: Slightly improved aeration at the lung bases compared to the prior examination. No acute cardiopulmonary disease. Electronically Signed   By: Markus Daft M.D.   On: 07/13/2020 10:35    Procedures Procedures (including critical care time)  Medications Ordered in ED Medications  dextrose (GLUTOSE) 40 % oral gel 37.5 g (37.5 g Oral Given 07/13/20 0932)  albuterol (VENTOLIN HFA) 108 (90 Base) MCG/ACT inhaler 2 puff (2 puffs Inhalation Given 07/13/20 1238)  ipratropium (ATROVENT HFA) inhaler 2 puff (2 puffs Inhalation Given 07/13/20 1238)  benzonatate (TESSALON) capsule 100 mg (100 mg Oral Given 07/13/20 1237)  potassium chloride SA (KLOR-CON) CR tablet 40 mEq (40 mEq Oral Given 07/13/20 1303)    ED Course  I have reviewed the triage vital signs and the nursing notes.  Pertinent labs & imaging results that were available during my care of the patient were reviewed by me and considered in my medical decision making (see chart for details).    MDM Rules/Calculators/A&P                         Patient reportedly was unresponsive this morning for nursing staff.  Patient became responsive with normal baseline mental status after receiving D50 from EMS.  EMS  identified patient blood sugar at 50 mg/dL.  Patient's granddaughter had added that the patient does not like the food too well at the nursing facility and may not have eaten her dinner yesterday evening.  Her granddaughter reports she did get given her insulin yesterday evening and EMR review indicates she got given insulin this morning.  At this time mental status changes consistent with hypoglycemia and is resolved.  She has second complaint of persistent cough after Covid.  Patient's granddaughter reports that the cough seemed to improve and now is returning some.  Patient has no respiratory distress.  There are some occasional wheeze.  Chest x-ray shows  improvement of prior pneumonia findings.  Patient does not have leukocytosis or lactic acid increase.  At this time this appears unlikely to be a bacterial pneumonia or secondary infection.  Most consistent with persistent bronchitis after Covid.  Will have patient use incentive spirometer, albuterol and Atrovent inhalers and Tessalon Per[place].  Careful return precautions reviewed. Final Clinical Impression(s) / ED Diagnoses Final diagnoses:  Hypoglycemia  Confusion  Acute bronchitis due to COVID-19 virus    Rx / DC Orders ED Discharge Orders         Ordered    benzonatate (TESSALON) 100 MG capsule  Every 8 hours        07/13/20 1350    albuterol (VENTOLIN HFA) 108 (90 Base) MCG/ACT inhaler  Every 4 hours PRN        07/13/20 1350    ipratropium (ATROVENT HFA) 17 MCG/ACT inhaler  Every 6 hours PRN        07/13/20 [place], MD 07/13/20 1402

## 2020-07-13 NOTE — H&P (Addendum)
[name] IZX:[id number] DOB: [birth year] DOA: 07/13/2020     PCP: [name], MD   Outpatient Specialists:      Oncology  Dr. Lindi Adie    Patient arrived to ER on 07/13/20 at 0918 Referred by Attending Lajean Saver, MD   Patient coming from:   From facility SNF [facility]  Chief Complaint:  Chief Complaint  Patient presents with  . Hypoglycemia    HPI: [name] is a 86 y.o. female with medical history significant of DM2,  history of breast cancer on anastrozole, SVT status post ablation with permanent pacemaker, chronic combined heart failure, chronic kidney disease stage IIIb, history of a stroke and recurrent DVT on Eliquis , recent COVID infection and PNeumonia    Presented with   unresponsive event this AM, noted to be hypoxic down to 50  Given d50 Noted to be 90% on RA BG up to 340 Pt recently admitted for COVID infection was discharged on 8 September And was discharged to SNF post COVID Producing yellow sputum, her symptoms seemed to be getting worse again    Infectious risk factors:  Reports  fever, shortness of breath  Cough,  Nausea   KNOWN COVID POSITIVE      Initial COVID TEST   POSITIVE,      Lab Results  Component Value Date   [place] (A) 07/13/2020   [place] (A) 06/19/2020   [place] NEGATIVE 08/20/2019   [place] NEGATIVE 08/10/2019    Regarding pertinent Chronic problems:   Hyperlipidemia - on statins lipitor Lipid Panel     Component Value Date/Time   CHOL 195 08/15/2018 1236   TRIG 56 01/02/2019 1425   HDL 81 08/15/2018 1236   CHOLHDL 2.4 08/15/2018 1236   CHOLHDL 2.6 12/11/2008 0130   VLDL 13 12/11/2008 0130   LDLCALC 96 08/15/2018 1236   LABVLDL 18 08/15/2018 1236    breast Cancer - on anastrozole   HTN on metoprolol, coreg, bidil   chronic CHF diastolic/systolic/ combined - last echo 06/23/2020 EF 37% Grade I diastolic dysfunction (impaired relaxation)  Right ventricular systolic  function is mildly reduced.  on Demadex  SVT -  status post pacemaker: Stable. Patient on amiodarone and normal sinus rhythm.  Coreg metoprolol  LBBB - stable  Hx of DVT on Eliquis   DM 2 -  Lab Results  Component Value Date   HGBA1C 9.1 (H) 06/22/2020   on insulin,    obesity-   BMI Readings from Last 1 Encounters:  07/13/20 31.92 kg/m      CKD stage III - baseline Cr 1.4   Lab Results  Component Value Date   CREATININE 1.71 (H) 07/13/2020   CREATININE 1.46 (H) 06/28/2020   CREATININE 1.31 (H) 06/27/2020       Chronic anemia - baseline hg Hemoglobin & Hematocrit  Recent Labs    06/26/20 0408 07/13/20 0934 07/13/20 0943  HGB 9.6* 9.9* 9.9*   While in ER: Was prepping for discharge when Noted to have a repeat episode of poor responsiveness vital signs showing satting 76% room air started on 3 L Patient noted to be febrile up to 101.8 tachycardic Troponin noted to be elevated 41-50 Patient reporting some chills Repeat chest x-ray showed worsening pneumonia started on antibiotics worrisome for aspiration pneumonia  Hospitalist was called for admission for acute respiratory failure thought to be secondary to aspiration pneumonia in the setting of recurrent hypoglycemic events  The following Work up has been ordered  so far:  Orders Placed This Encounter  Procedures  . SARS Coronavirus 2 by RT PCR (hospital order, performed in [facility] hospital lab) Nasopharyngeal Nasopharyngeal Swab  . Urine culture  . Blood culture (routine x 2)  . DG Chest Port 1 View  . CT Head Wo Contrast  . DG Chest Portable 1 View  . Comprehensive metabolic panel  . Lactic acid, plasma  . CBC with Differential  . Protime-INR  . Urinalysis, Routine w reflex microscopic  . Brain natriuretic peptide  . Nursing communication  . Bladder scan  . Nursing communication  . Vital signs  . Check Rectal Temperature  . Check Rectal Temperature  . Consult to hospitalist  ALL PATIENTS BEING  ADMITTED/HAVING PROCEDURES NEED COVID-19 SCREENING  . Airborne / Contact ([place]) Isolation  . Incentive spirometry RT  . Pulse oximetry, continuous  . CBG monitoring, ED  . I-Stat venous blood gas, [facility] ED)  . CBG monitoring, ED  . POC CBG, ED  . EKG 12-Lead  . ED EKG  . Saline lock IV    Following Medications were ordered in ER: Medications  cefTRIAXone (ROCEPHIN) 1 g in sodium chloride 0.9 % 100 mL IVPB (has no administration in time range)  azithromycin (ZITHROMAX) 500 mg in sodium chloride 0.9 % 250 mL IVPB (has no administration in time range)  dextrose (GLUTOSE) 40 % oral gel 37.5 g (37.5 g Oral Given 07/13/20 0932)  albuterol (VENTOLIN HFA) 108 (90 Base) MCG/ACT inhaler 2 puff (2 puffs Inhalation Given 07/13/20 1238)  ipratropium (ATROVENT HFA) inhaler 2 puff (2 puffs Inhalation Given 07/13/20 1238)  benzonatate (TESSALON) capsule 100 mg (100 mg Oral Given 07/13/20 1237)  potassium chloride SA (KLOR-CON) CR tablet 40 mEq (40 mEq Oral Given 07/13/20 1303)        Consult Orders  (From admission, onward)         Start     Ordered   07/13/20 1800  Consult to hospitalist  ALL PATIENTS BEING ADMITTED/HAVING PROCEDURES NEED COVID-19 SCREENING  Once       Comments: ALL PATIENTS BEING ADMITTED/HAVING PROCEDURES NEED COVID-19 SCREENING  Provider:  (Not yet assigned)  Question Answer Comment  Place call to: [facility]   Reason for Consult Admit      07/13/20 1759         Significant initial  Findings: Abnormal Labs Reviewed  SARS CORONAVIRUS 2 BY RT PCR (HOSPITAL ORDER, [facility]) - Abnormal; Notable for the following components:      Result Value   SARS Coronavirus 2 POSITIVE (*)    All other components within normal limits  COMPREHENSIVE METABOLIC PANEL - Abnormal; Notable for the following components:   Potassium 3.1 (*)    Creatinine, Ser 1.71 (*)    Calcium 8.3 (*)    Albumin 2.8 (*)    GFR calc non Af Amer 27 (*)    GFR calc Af  Amer 31 (*)    All other components within normal limits  CBC WITH DIFFERENTIAL/PLATELET - Abnormal; Notable for the following components:   RBC 3.29 (*)    Hemoglobin 9.9 (*)    HCT 32.2 (*)    Platelets 111 (*)    Lymphs Abs 0.6 (*)    All other components within normal limits  URINALYSIS, ROUTINE W REFLEX MICROSCOPIC - Abnormal; Notable for the following components:   APPearance CLOUDY (*)    Leukocytes,Ua MODERATE (*)    Bacteria, UA RARE (*)  All other components within normal limits  I-STAT VENOUS BLOOD GAS, ED - Abnormal; Notable for the following components:   pH, Ven 7.593 (*)    pCO2, Ven 34.2 (*)    pO2, Ven 125.0 (*)    Bicarbonate 33.0 (*)    TCO2 34 (*)    Acid-Base Excess 11.0 (*)    Potassium 3.2 (*)    Calcium, Ion 1.00 (*)    HCT 29.0 (*)    Hemoglobin 9.9 (*)    All other components within normal limits  CBG MONITORING, ED - Abnormal; Notable for the following components:   Glucose-Capillary 105 (*)    All other components within normal limits  CBG MONITORING, ED - Abnormal; Notable for the following components:   Glucose-Capillary 306 (*)    All other components within normal limits  TROPONIN I (HIGH SENSITIVITY) - Abnormal; Notable for the following components:   Troponin I (High Sensitivity) 41 (*)    All other components within normal limits  TROPONIN I (HIGH SENSITIVITY) - Abnormal; Notable for the following components:   Troponin I (High Sensitivity) 52 (*)    All other components within normal limits   Otherwise labs showing:    Recent Labs  Lab 07/13/20 0934 07/13/20 0943  NA 144 144  K 3.1* 3.2*  CO2 29  --   GLUCOSE 71  --   BUN 19  --   CREATININE 1.71*  --   CALCIUM 8.3*  --     Cr    Up from baseline see below Lab Results  Component Value Date   CREATININE 1.71 (H) 07/13/2020   CREATININE 1.46 (H) 06/28/2020   CREATININE 1.31 (H) 06/27/2020    Recent Labs  Lab 07/13/20 0934  AST 27  ALT 22  ALKPHOS 62  BILITOT 0.5   PROT 6.5  ALBUMIN 2.8*   Lab Results  Component Value Date   CALCIUM 8.3 (L) 07/13/2020   PHOS 3.2 06/26/2020          WBC      Component Value Date/Time   WBC 4.2 07/13/2020 0934   LYMPHSABS 0.6 (L) 07/13/2020 0934   LYMPHSABS 1.2 01/15/2019 1303   LYMPHSABS 1.2 11/22/2011 1112   MONOABS 0.5 07/13/2020 0934   MONOABS 0.3 11/22/2011 1112   EOSABS 0.2 07/13/2020 0934   EOSABS 0.2 01/15/2019 1303   BASOSABS 0.0 07/13/2020 0934   BASOSABS 0.1 01/15/2019 1303   BASOSABS 0.0 11/22/2011 1112    Plt: Lab Results  Component Value Date   PLT 111 (L) 07/13/2020     Lactic Acid, Venous    Component Value Date/Time   LATICACIDVEN 1.1 07/13/2020 1213     Procalcitonin  Ordered    COVID-19 Labs  No results for input(s): DDIMER, FERRITIN, LDH, CRP in the last 72 hours.  Lab Results  Component Value Date   SARSCOV2NAA POSITIVE (A) 07/13/2020   SARSCOV2NAA POSITIVE (A) 06/19/2020   SARSCOV2NAA NEGATIVE 08/20/2019   SARSCOV2NAA NEGATIVE 08/10/2019      Venous  Blood Gas result:  pH 7.593  pCO2 34   ABG    Component Value Date/Time   HCO3 33.0 (H) 07/13/2020 0943   TCO2 34 (H) 07/13/2020 0943   O2SAT 99.0 07/13/2020 0943    HG/HCT  Stable,     Component Value Date/Time   HGB 9.9 (L) 07/13/2020 0943   HGB 10.0 (L) 07/02/2019 1218   HGB 11.0 (L) 11/22/2011 1112   HCT 29.0 (L) 07/13/2020 1937  HCT 30.2 (L) 07/02/2019 1218   HCT 33.5 (L) 11/22/2011 1112   MCV 97.9 07/13/2020 0934   MCV 91 07/02/2019 1218   MCV 92.8 11/22/2011 1112    No results for input(s): LIPASE, AMYLASE in the last 168 hours. No results for input(s): AMMONIA in the last 168 hours.    Troponin [id number] Cardiac Panel (last 3 results) No results for input(s): CKTOTAL, CKMB, TROPONINI, RELINDX in the last 72 hours.   ECG: Ordered Personally reviewed by me showing: HR : 104 Rhythm  Paced   QTC 569   BNP (last 3 results) Recent Labs    08/10/19 1441  BNP 138.5*      DM   labs:  HbA1C: Recent Labs    04/02/20 1244 06/22/20 1621  HGBA1C 8.9* 9.1*     CBG (last 3)  Recent Labs    07/13/20 0924 07/13/20 1100 07/13/20 1843  GLUCAP 75 105* 306*       UA leukocytes   Urine analysis:    Component Value Date/Time   COLORURINE YELLOW 07/13/2020 1240   APPEARANCEUR CLOUDY (A) 07/13/2020 1240   LABSPEC 1.010 07/13/2020 1240   PHURINE 5.0 07/13/2020 1240   GLUCOSEU NEGATIVE 07/13/2020 1240   HGBUR NEGATIVE 07/13/2020 [place] 07/13/2020 1240   BILIRUBINUR negative 11/28/2019 1206   KETONESUR NEGATIVE 07/13/2020 1240   PROTEINUR NEGATIVE 07/13/2020 1240   UROBILINOGEN 0.2 11/28/2019 1206   UROBILINOGEN 0.2 10/12/2012 2037   NITRITE NEGATIVE 07/13/2020 1240   LEUKOCYTESUR MODERATE (A) 07/13/2020 1240       Ordered  CT HEAD   NON acute  CXR - Edema vs PNA    ED Triage Vitals  Enc Vitals Group     BP 07/13/20 0932 (!) 154/59     Pulse Rate 07/13/20 0932 75     Resp 07/13/20 0932 18     Temp 07/13/20 0932 (!) 96.5 F (35.8 C)     Temp Source 07/13/20 0932 Temporal     SpO2 07/13/20 0932 95 %     Weight 07/13/20 0942 191 lb 12.8 oz (87 kg)     Height 07/13/20 0942 5' 5"  (1.651 m)     Head Circumference --      Peak Flow --      Pain Score 07/13/20 0923 0     Pain Loc --      Pain Edu? --      Excl. in [place]? --   TMAX(24)@       Latest  Blood pressure 109/67, pulse (!) 117, temperature 98.6 F (37 C), temperature source Oral, resp. rate (!) 26, height 5' 5"  (1.651 m), weight 87 kg, SpO2 100 %.       Review of Systems:    Pertinent positives include:  Fevers, chills, fatigue,  confusion nausea, vomiting, Constitutional:  No weight loss, night sweats, weight loss  HEENT:  No headaches, Difficulty swallowing,Tooth/dental problems,Sore throat,  No sneezing, itching, ear ache, nasal congestion, post nasal drip,  Cardio-vascular:  No chest pain, Orthopnea, PND, anasarca, dizziness, palpitations.no Bilateral  lower extremity swelling  GI:  No heartburn, indigestion, abdominal pain,  diarrhea, change in bowel habits, loss of appetite, melena, blood in stool, hematemesis Resp:  no shortness of breath at rest. No dyspnea on exertion, No excess mucus, no productive cough, No non-productive cough, No coughing up of blood.No change in color of mucus.No wheezing. Skin:  no rash or lesions. No jaundice GU:  no dysuria, change in color  of urine, no urgency or frequency. No straining to urinate.  No flank pain.  Musculoskeletal:  No joint pain or no joint swelling. No decreased range of motion. No back pain.  Psych:  No change in mood or affect. No depression or anxiety. No memory loss.  Neuro: no localizing neurological complaints, no tingling, no weakness, no double vision, no gait abnormality, no slurred speech, no  All systems reviewed and apart from [place] all are negative  Past Medical History:   Past Medical History:  Diagnosis Date  . Abnormal echocardiogram    a. possible mass on echo 05/2019 on PPM, b. no evidence of mass / atrial lead vegetation on follow up echo 06/2019  . Anemia   . Arthritis   . Breast cancer ([place]) 10/2018   Invasive ductal carcinoma  . Cancer ([place])   . Cataract   . Chronic combined systolic and diastolic CHF (congestive heart failure) ([place])    a. Previously diastolic but patient AVS from outside hospital listed systolic CHF and cardiomyopathy, records pending.  . CKD (chronic kidney disease), stage III   . Clotting disorder ([place])   . CVA (cerebral vascular accident) ([place])    residual right sided weakness and mild dysphagia  . Diabetes mellitus ([place])   . Dizziness and giddiness   . DVT (deep venous thrombosis) (H[facility])    a. on anticoagulation for this.  . Essential hypertension   . LBBB (left bundle branch block)   . Mitral regurgitation    a. Mod MR by echo 2014.  . Muscular deconditioning   . Obesity   . Pacemaker   . Pericardial effusion   . SVT  (supraventricular tachycardia) ([place])    a. In 2013 she had an EPS with ablation for SVT which did not eliminate the SVT completely. She also had bradycardia which limited medication. She was placed on amiodarone by Dr. Lovena Le.  . Thrombocytopenia ([place]) 11/21/2011     Past Surgical History:  Procedure Laterality Date  . EYE SURGERY    . SUPRAVENTRICULAR TACHYCARDIA ABLATION N/A 10/11/2012   Procedure: SUPRAVENTRICULAR TACHYCARDIA ABLATION;  Surgeon: Evans Lance, MD;  Location[facility] CATH LAB;  Service: Cardiovascular;  Laterality: N/A;    Social History:  Ambulatory walker        reports that she has never smoked. She has never used smokeless tobacco. She reports that she does not drink alcohol and does not use drugs.  Family History:   Family History  Problem Relation Age of Onset  . Stroke Mother   . Cancer Father        prostate  . Hypertension Daughter   . Heart attack Brother        x2  . Hypertension Brother   . Stroke Sister   . Hypertension Sister   . Breast cancer Maternal Aunt     Allergies: Allergies  Allergen Reactions  . Aspirin Itching  . Penicillins Itching and Rash    DID THE REACTION INVOLVE: Swelling of the face/tongue/throat, SOB, or low BP? Yes Sudden or severe rash/hives, skin peeling, or the inside of the mouth or nose? No Did it require medical treatment? Yes When did it last happen?"More than 10 years ago" If all above answers are "NO", may proceed with cephalosporin use.      Prior to Admission medications   Medication Sig Start Date End Date Taking? Authorizing Provider  acetaminophen (TYLENOL) 500 MG tablet Take 1,000 mg by mouth every 6 (six) hours as needed for  mild pain, moderate pain or headache.     [provider]  albuterol (VENTOLIN HFA) 108 (90 Base) MCG/ACT inhaler Inhale 2 puffs into the lungs every 4 (four) hours as needed for wheezing or shortness of breath. 07/13/20   Charlesetta Shanks, MD  amiodarone (PACERONE) 200 MG  tablet Take one tablet by mouth daily Monday-Saturday. Do NOT take Sunday 05/06/20   Evans Lance, MD  anastrozole (ARIMIDEX) 1 MG tablet TAKE 1 TABLET(1 MG) BY MOUTH DAILY Patient taking differently: Take 1 mg by mouth daily.  01/15/20   Nicholas Lose, MD  atorvastatin (LIPITOR) 20 MG tablet Take 1 tablet (20 mg total) by mouth at bedtime. 06/14/19   [name], MD  benzonatate (TESSALON) 100 MG capsule Take 1 capsule (100 mg total) by mouth every 8 (eight) hours. Swallow capsule whole.  Do not chew 07/13/20   Charlesetta Shanks, MD  carvedilol (COREG) 12.5 MG tablet Take 1 tablet (12.5 mg total) by mouth 2 (two) times daily with a meal. 06/30/20   [name], MD  Cholecalciferol (VITAMIN D-3 PO) Take 1 capsule by mouth daily after breakfast.    [provider]  Cyanocobalamin (B-12 PO) Take 1 tablet by mouth daily after breakfast.     [provider]  diclofenac sodium (VOLTAREN) 1 % GEL Apply 2 g topically 4 (four) times daily. Patient taking differently: Apply 2 g topically 4 (four) times daily as needed (pain).  10/30/18   Rodriguez-Southworth, Sunday Spillers, PA-C  ELIQUIS 2.5 MG TABS tablet TAKE 1 TABLET(2.5 MG) BY MOUTH TWICE DAILY 07/13/20   Dunn, Nedra Hai, PA-C  famotidine (PEPCID) 40 MG tablet Take 40 mg by mouth 2 (two) times daily. 05/22/20   [provider]  ferrous sulfate 325 (65 FE) MG tablet Take 325 mg by mouth daily with breakfast.    [provider]  HUMALOG KWIKPEN 200 UNIT/ML KwikPen INJECT 3 UNITS UNDER THE SKIN BEFORE BREAKFAST, LUNCH, DINNER IF SUGARS ARE GREATER THAN 175. Patient taking differently: Inject 3 Units into the skin 3 (three) times daily as needed (Blood Sugar > 175).  02/11/20   [name], MD  insulin detemir (LEVEMIR) 100 UNIT/ML injection Inject 0.35 mLs (35 Units total) into the skin at bedtime. 07/01/20   [name], MD  ipratropium (ATROVENT HFA) 17 MCG/ACT inhaler Inhale 2 puffs into the lungs every 6 (six) hours as needed  for wheezing. 07/13/20   Charlesetta Shanks, MD  isosorbide-hydrALAZINE (BIDIL) 20-37.5 MG tablet Take 1 tablet by mouth 3 (three) times daily. 02/28/20   Jettie Booze, MD  mirtazapine (REMERON) 7.5 MG tablet TAKE 1 TABLET(7.5 MG) BY MOUTH AT BEDTIME Patient taking differently: Take 7.5 mg by mouth at bedtime.  04/14/20   [name], MD  MYRBETRIQ 50 MG TB24 tablet Take 50 mg by mouth daily. 05/24/20   [provider]  omeprazole (PRILOSEC) 40 MG capsule TAKE 1 CAPSULE BY MOUTH DAILY BEFORE A MEAL Patient taking differently: Take 40 mg by mouth in the morning, at noon, and at bedtime.  12/11/19   [name], MD  oxybutynin (DITROPAN) 5 MG tablet TAKE 1 TABLET(5 MG) BY MOUTH TWICE DAILY Patient taking differently: Take 5 mg by mouth 2 (two) times daily.  12/23/19   [name], MD  pregabalin (LYRICA) 75 MG capsule Take 1 capsule (75 mg total) by mouth at bedtime. 06/30/20   Minette Brine, FNP  Semaglutide,0.25 or 0.5MG/DOS, (OZEMPIC, 0.25 OR 0.5 MG/DOSE,) 2 MG/1.5ML SOPN Inject 0.25 mg into the skin  once a week. Take on Wednesday    [provider]  torsemide (DEMADEX) 20 MG tablet Take 1 tablet (20 mg total) by mouth 2 (two) times daily. You may take an extra 20 mg tablet daily as needed for swelling 01/14/20   Jettie Booze, MD   Physical Exam: Vitals with BMI 07/13/2020 07/13/2020 07/13/2020  Height - - -  Weight - - -  BMI - - -  Systolic 244 010 272  Diastolic 67 74 74  Pulse - - 117     1. General:  in No  Acute distress   Chronically ill  -appearing 2. Psychological: Alert and  Oriented 3. Head/ENT:  Dry Mucous Membranes                          Head Non traumatic, neck supple                            Poor Dentition 4. SKIN: normal   Skin turgor,  Skin clean Dry and intact no rash 5. Heart: Regular rate and rhythm no  Murmur, no Rub or gallop 6. Lungs: distant  no wheezes or crackles   7. Abdomen: Soft,   non-tender, Non distended   obese bowel  sounds present 8. Lower extremities: no clubbing, cyanosis, no  edema 9. Neurologically Grossly intact, moving all 4 extremities equally  10. MSK: Normal range of motion   All other LABS:     Recent Labs  Lab 07/13/20 0934 07/13/20 0943  WBC 4.2  --   NEUTROABS 2.8  --   HGB 9.9* 9.9*  HCT 32.2* 29.0*  MCV 97.9  --   PLT 111*  --      Recent Labs  Lab 07/13/20 0934 07/13/20 0943  NA 144 144  K 3.1* 3.2*  CL 104  --   CO2 29  --   GLUCOSE 71  --   BUN 19  --   CREATININE 1.71*  --   CALCIUM 8.3*  --      Recent Labs  Lab 07/13/20 0934  AST 27  ALT 22  ALKPHOS 62  BILITOT 0.5  PROT 6.5  ALBUMIN 2.8*       Cultures:    Component Value Date/Time   SDES URINE, RANDOM 08/10/2019 1625   SPECREQUEST  08/10/2019 1625    NONE Performed at [facility], [place] [address] [place], [place] [address]    CULT >=100,000 COLONIES/mL ENTEROBACTER HORMAECHEI (A) 08/10/2019 1625   REPTSTATUS 08/13/2019 FINAL 08/10/2019 1625     Radiological Exams on Admission: CT Head Wo Contrast  Result Date: 07/13/2020 CLINICAL DATA:  Mental status change. EXAM: CT HEAD WITHOUT CONTRAST TECHNIQUE: Contiguous axial images were obtained from the base of the skull through the vertex without intravenous contrast. COMPARISON:  08/10/2019 FINDINGS: Brain: No evidence for acute hemorrhage, mass lesion, midline shift, hydrocephalus or large infarct. Small calcifications in the basal ganglia. Vascular: No hyperdense vessel or unexpected calcification. Skull: Normal. Negative for fracture or focal lesion. Sinuses/Orbits: Mucosal thickening in the maxillary sinuses, right side greater than left. Mild mucosal thickening involving the left frontal sinus and ethmoid air cells. Other: None IMPRESSION: 1. No acute intracranial abnormality. 2. Mild paranasal sinus disease. Electronically Signed   By: Markus Daft M.D.   On: 07/13/2020 10:45   DG Chest Portable 1 View  Result Date: 07/13/2020 CLINICAL  DATA:  Hypoxia EXAM: PORTABLE CHEST 1 VIEW COMPARISON:  07/13/2020 at 9:51 a.m. FINDINGS: Single frontal view of the chest demonstrates stable dual lead pacemaker. Cardiac silhouette is unchanged. Since the prior exam, there is progressive central vascular congestion with increased interstitial and ground-glass opacity within the perihilar distribution. Trace right pleural effusion. No pneumothorax. IMPRESSION: 1. Worsening bilateral perihilar interstitial and ground-glass opacities, favor mild edema over progressive pneumonia. Electronically Signed   By: Randa Ngo M.D.   On: 07/13/2020 17:11   DG Chest Port 1 View  Result Date: 07/13/2020 CLINICAL DATA:  Unresponsive and cough. EXAM: PORTABLE CHEST 1 VIEW COMPARISON:  06/25/2020 FINDINGS: Stable appearance of the left dual chamber cardiac pacemaker. Heart size is upper limits of normal but stable. Evidence for a soft tissue Mach line overlying the left upper chest. No evidence to suggest a pneumothorax. No focal airspace disease or lung consolidation. Trachea is deviated towards the right and this is unchanged. IMPRESSION: Slightly improved aeration at the lung bases compared to the prior examination. No acute cardiopulmonary disease. Electronically Signed   By: Markus Daft M.D.   On: 07/13/2020 10:35    Chart has been reviewed   Assessment/Plan   86 y.o. female with medical history significant of DM2,  history of breast cancer on anastrozole, SVT status post ablation with permanent pacemaker, chronic combined heart failure, chronic kidney disease stage IIIb, history of a stroke and recurrent DVT on Eliquis , recent COVID infection and PNeumonia Admitted for acute respiratory failure with hypoxia secondary to aspiration pneumonia versus CHF exacerbation  Present on Admission: . Aspiration pneumonia (HCC) allergic to penicillin continue with Rocephin also had azithromycin.  This occurred in setting of hypoglycemia currently patient bit mental  status back to base Speech pathology evaluate  . Sepsis ([place]) -   -SIRS criteria met with       Component Value Date/Time   WBC 4.2 07/13/2020 0934   LYMPHSABS 0.6 (L) 07/13/2020 0934   LYMPHSABS 1.2 01/15/2019 1303   LYMPHSABS 1.2 11/22/2011 1112   fever   RR >20 Today's Vitals   07/13/20 1915 07/13/20 1930 07/13/20 1945 07/13/20 2000  BP: 120/62 115/60 117/60 (!) 120/98  Pulse: 96 90 93 96  Resp: (!) 40 (!) 25 (!) 28 (!) 30  Temp:      TempSrc:      SpO2: 100% 100% 100% 100%  Weight:      Height:      PainSc:         This patient meets SIRS Criteria and may be septic.    The recent clinical data is shown below. Vitals:   07/13/20 1915 07/13/20 1930 07/13/20 1945 07/13/20 2000  BP: 120/62 115/60 117/60 (!) 120/98  Pulse: 96 90 93 96  Resp: (!) 40 (!) 25 (!) 28 (!) 30  Temp:      TempSrc:      SpO2: 100% 100% 100% 100%  Weight:      Height:         -Most likely source being pulmonary,  Meeting criteria for severe sepsis  acute metabolic encephalopathy  RJJ<88 mmhg    Acute hypoxia requiring new supplemental oxygen, SpO2: 100 % O2 Flow Rate (L/min): 2 L/min     - Obtain serial lactic acid and procalcitonin level.  - Initiated IV antibiotics   - await results of blood and urine culture  - Rehydrate aggressively       10:15 PM  . Acute on chronic diastolic CHF (congestive heart  failure) ([place]) -  given hypotension will hold off on diuresis.  Appreciate cardiology input we will order echogram   . CKD (chronic kidney dsease), stage III ([place]) avoid nephrotoxic medications monitor kidney for  Hx of COVID infection -patient have had severe illness with some of recurrence of his symptoms although I suspect currently more consistent with aspiration pneumonia versus CHF exacerbation.  But given severity of illness and prolonged hospital stay will continue on precautions for now patient continues to test positive which does not necessarily mean that she is having active  infection  Dm2 - - Order Sensitive  SSI   hold insulin  -  check TSH and HgA1C  - Hold by mouth medications    . HTN (hypertension) -hold home medications given hypotension  . Hypokalemia -replace and recheck, check magnesium level   . LBBB (left bundle branch block), patient currently status post pacemaker continue to  . Hypoglycemia -in the setting of dehydration decreased p.o. intake.  Will hold insulin long-lasting.  Currently blood sugars in 300s will restart sliding scale and monitor carefully  . Elevated troponin -currently denies any chest pain obtain echogram initiate heparin drip as per cardiology  History of breast cancer continue home medications  History of DVT bridge with heparin while having elevated troponin patient has been on anticoagulation consistently Other plan as per orders.  DVT prophylaxis:   Heparin drip    Code Status:    Code Status: Prior FULL CODE   as per patient  I had personally discussed CODE STATUS with patient      Family Communication:   Family not at  Bedside    Disposition Plan:                            Back to current facility when stable                              Following barriers for discharge:                            Electrolytes corrected                                                           Afebrile, white count improving able to transition to PO antibiotics                             Will need to be able to tolerate PO                                                      Will need consultants to evaluate patient prior to discharge                       Would benefit from PT/OT eval prior to DC  Ordered                   Swallow eval -  SLP ordered                   Diabetes care coordinator                   Transition of care consulted                   Nutrition    consulted                                       Consults called: Cardiology is aware  Admission status:  ED Disposition    ED Disposition  Condition [place]: [place] [[id number]]  Level of Care: Progressive [102]  Admit to Progressive based on following criteria: CARDIOVASCULAR & THORACIC of moderate stability with acute coronary syndrome symptoms/low risk myocardial infarction/hypertensive urgency/arrhythmias/heart failure potentially compromising stability and stable post cardiovascular intervention patients.  May admit patient to [facility] or [facility] if equivalent level of care is available:: No  Covid Evaluation: Confirmed COVID Positive  Diagnosis: Aspiration pneumonia [facility]) [[id number]]  Admitting Physician: Toy Baker [[id number]]  Attending Physician: Toy Baker [[id number]]  Estimated length of stay: past midnight tomorrow  Certification:: I certify this patient will need inpatient services for at least 2 midnights         inpatient     I Expect 2 midnight stay secondary to severity of patient's current illness need for inpatient interventions justified by the following:  hemodynamic instability despite optimal treatment (tachycardia  hypotension  hypoxia,    Severe lab/radiological/exam abnormalities including:    PNA and extensive comorbidities including:  DM2   CHF  Morbid Obesity   CKD   malignancy, .   . Chronic anticoagulation  That are currently affecting medical management.   I expect  patient to be hospitalized for 2 midnights requiring inpatient medical care.  Patient is at high risk for adverse outcome (such as loss of life or disability) if not treated.  Indication for inpatient stay as follows:  Severe change from baseline regarding mental status Hemodynamic instability despite maximal medical therapy,    New or worsening hypoxia  Need for IV antibiotics, IV fluids,   IV anticoagulation    Level of care  progressive   tele indefinitely please discontinue once patient no longer qualifies COVID-19 Labs     Lab Results  Component Value  Date   SARSCOV2NAA POSITIVE (A) 07/13/2020     Precautions: admitted as   covid positive Airborne and Contact precautions    PPE: Used by the provider:   P100  eye Goggles,  Gloves  gown    Talayia Hjort 07/13/2020, 10:23 PM    [facility]     after 2 AM please page floor coverage PA If 7AM-7PM, please contact the day team taking care of the patient using [email or web address]   Patient was evaluated in the context of the global COVID-19 pandemic, which necessitated consideration that the patient might be at risk for infection with the SARS-CoV-2 virus that causes COVID-19. Institutional protocols and algorithms that pertain to the evaluation of patients at risk for COVID-19 are in a state of rapid change based on information released by regulatory bodies including the CDC and federal and state organizations. These policies and algorithms were followed during the patient's care.

## 2020-07-13 NOTE — ED Notes (Signed)
Pt moved to rm 14, report received from [place], [place]. Pt alert, c/o cold, oriented to self, situation and time.

## 2020-07-13 NOTE — Progress Notes (Signed)
ANTICOAGULATION CONSULT NOTE - Initial Consult  Pharmacy Consult for IV heparin Indication: chest pain/ACS  Allergies  Allergen Reactions  . Aspirin Itching  . Penicillins Itching and Rash    DID THE REACTION INVOLVE: Swelling of the face/tongue/throat, SOB, or low BP? Yes Sudden or severe rash/hives, skin peeling, or the inside of the mouth or nose? No Did it require medical treatment? Yes When did it last happen?"More than 10 years ago" If all above answers are "NO", may proceed with cephalosporin use.     Patient Measurements: Height: 5\' 5"  (165.1 cm) Weight: 87 kg (191 lb 12.8 oz) IBW/kg (Calculated) : 57 Heparin Dosing Weight: 76kg  Vital Signs: Temp: 101.8 F (38.8 C) (09/20 1914) Temp Source: Rectal (09/20 1914) BP: 120/98 (09/20 2000) Pulse Rate: 96 (09/20 2000)  Labs: Recent Labs    07/13/20 0934 07/13/20 0943 07/13/20 1213 07/13/20 1836  HGB 9.9* 9.9*  --   --   HCT 32.2* 29.0*  --   --   PLT 111*  --   --   --   LABPROT 13.6  --   --   --   INR 1.1  --   --   --   CREATININE 1.71*  --   --   --   TROPONINIHS 41*  --  52* 466*    Estimated Creatinine Clearance: 26.2 mL/min (A) (by C-G formula based on SCr of 1.71 mg/dL (H)).   Medical History: Past Medical History:  Diagnosis Date  . Abnormal echocardiogram    a. possible mass on echo 05/2019 on PPM, b. no evidence of mass / atrial lead vegetation on follow up echo 06/2019  . Anemia   . Arthritis   . Breast cancer ([place]) 10/2018   Invasive ductal carcinoma  . Cancer ([place])   . Cataract   . Chronic combined systolic and diastolic CHF (congestive heart failure) ([place])    a. Previously diastolic but patient AVS from outside hospital listed systolic CHF and cardiomyopathy, records pending.  . CKD (chronic kidney disease), stage III   . Clotting disorder ([place])   . CVA (cerebral vascular accident) ([place])    residual right sided weakness and mild dysphagia  . Diabetes mellitus ([place])   . Dizziness and  giddiness   . DVT (deep venous thrombosis) ([facility])    a. on anticoagulation for this.  . Essential hypertension   . LBBB (left bundle branch block)   . Mitral regurgitation    a. Mod MR by echo 2014.  . Muscular deconditioning   . Obesity   . Pacemaker   . Pericardial effusion   . SVT (supraventricular tachycardia) ([place])    a. In 2013 she had an EPS with ablation for SVT which did not eliminate the SVT completely. She also had bradycardia which limited medication. She was placed on amiodarone by Dr. Lovena Le.  . Thrombocytopenia ([place]) 11/21/2011    Medications:  Infusions:  . sodium chloride    . [START ON 07/14/2020] azithromycin    . [START ON 07/14/2020] cefTRIAXone (ROCEPHIN)  IV      Assessment: 87yoF presenting with possible ACS. Troponins have elevated since admission, now 466. EKG WNL. Hemoglobin is 9.9, PLT ~100. Last dose of apixaban was 9/20 AM. ECHO to be performed 9/21 AM.   Pharmacy consulted to dose IV heparin.  Goal of Therapy:  Heparin level 0.3-0.7 units/ml aPTT 66-102 seconds Monitor platelets by anticoagulation protocol: Yes   Plan:  Start heparin infusion at 1,000 units/hr Monitor  aPTT in ~6 hours Follow aPTT and heparin daily until correlated, then discontinue aPTT  Continue to monitor H&H and platelets  Follow cardiology plans  Mercy Riding, PharmD PGY1 Acute Care Pharmacy Resident Please refer to [facility] for unit-specific pharmacist

## 2020-07-13 NOTE — ED Provider Notes (Addendum)
Physical Exam  BP (!) 120/98   Pulse 96   Temp (!) 101.8 F (38.8 C) (Rectal)   Resp (!) 30   Ht 5\' 5"  (1.651 m)   Wt 87 kg   SpO2 100%   BMI 31.92 kg/m   Physical Exam Vitals and nursing note reviewed.  Constitutional:      General: She is not in acute distress.    Appearance: She is well-developed.  HENT:     Head: Normocephalic and atraumatic.     Nose: Nose normal.  Eyes:     General: No scleral icterus.       Left eye: No discharge.     Conjunctiva/sclera: Conjunctivae normal.  Cardiovascular:     Rate and Rhythm: Regular rhythm. Tachycardia present.     Heart sounds: Normal heart sounds. No murmur heard.  No friction rub. No gallop.   Pulmonary:     Effort: Pulmonary effort is normal. Tachypnea present. No respiratory distress.     Breath sounds: Normal breath sounds.  Abdominal:     General: Bowel sounds are normal. There is no distension.     Palpations: Abdomen is soft.     Tenderness: There is no abdominal tenderness. There is no guarding.  Musculoskeletal:        General: Normal range of motion.     Cervical back: Normal range of motion and neck supple.     Right lower leg: Edema present.     Left lower leg: Edema present.  Skin:    General: Skin is warm and dry.     Findings: No rash.  Neurological:     Mental Status: She is alert.     Motor: No abnormal muscle tone.     Coordination: Coordination normal.     ED Course/Procedures   Clinical Course as of Jul 14 2115  [facility] Jul 13, 2020  2116 Troponin I (High Sensitivity)(!!): 466 [HK]    Clinical Course User Index [HK] Onalee Steinbach, PA-C    .Critical Care Performed by: Delia Heady, PA-C Authorized by: Delia Heady, PA-C   Critical care provider statement:    Critical care time (minutes):  35   Critical care was necessary to treat or prevent imminent or life-threatening deterioration of the following conditions:  Circulatory failure, respiratory failure and cardiac failure   Critical care  was time spent personally by me on the following activities:  Development of treatment plan with patient or surrogate, discussions with primary provider, evaluation of patient's response to treatment, obtaining history from patient or surrogate, examination of patient, review of old charts, re-evaluation of patient's condition, pulse oximetry, ordering and review of radiographic studies and ordering and performing treatments and interventions   I assumed direction of critical care for this patient from another provider in my specialty: no      MDM  Patient initially presented to the ED this morning for mental status changes secondary to hypoglycemia.  She was Covid positive at the end of August and was hospitalized until 07/01/2020.  She also complained of a persistent cough productive with yellow sputum. Work-up this morning showed blood sugar of 50 which responded well to D50.  Chest.  This morning showed improvement of prior pneumonia findings.  Lab work appears unremarkable.  Troponins today were elevated to 41, 52.  She was discharged with inhalers.  She was waiting in another pod to be picked up by [facility].  Nurse found that she had emesis and stool around her, hypoxic to  the 70s on room air and was placed on 2 L.  Collier Salina also reported fever, although I do not see any documentation of this and unsure which way this was taken.  Patient complains of cough and chills.  He denies shortness of breath, chest pain, abdominal pain.  Abdomen is soft, nontender nondistended.  She does have pitting edema bilateral lower extremities symmetrically which was noted this morning.  5:45 PM Repeat chest x-ray shows worsening effusion versus pneumonia.  6:00 PM Due to hypoxia, worsening pneumonia on chest x-ray, fever, will need to admit for further management.  We will begin antibiotics.  7:11 PM Have ordered BNP cultures and third troponin. She has a rectal temperature of 101.35F. UA this morning with some  leukocytes and bacteria, sent for culture and not treated as UTI. Dr. Roel Cluck to admit to hospitalist service.  9:13 PM Patient's third troponin today is elevated at 466. EKG shows paced rhythm. Consulted cardiology who recommends order echo in AM, trend troponins; D/c carvedilol, hold Eliquis, ask pharmacy about when to give heparin (due to eliquis dose this AM and AKI), repeat Cr, lactic; ASA 325 give here (listed as allergy). I have updated Drs. Ashok Cordia and Doutova on cardiology recommendations. Cardiology to see patient in consult. Appreciate the help of cardiology and hospitalist in management of this patient.    Portions of this note were generated with [employer]. Dictation errors may occur despite best attempts at proofreading.       Delia Heady, PA-C 07/13/20 2133    Lajean Saver, MD 07/13/20 2237

## 2020-07-13 NOTE — Telephone Encounter (Addendum)
Prescription refill request for Eliquis received.  Last office visit: [name], 05/06/2020 Scr: 1.46, 06/28/2020 Age: 86 y.o. Weight: 87.1 kg    Pt Scr has decreased to 1.31, per dosing criteria pt qualifies for a dose increase of Eliquis.

## 2020-07-13 NOTE — Telephone Encounter (Signed)
Spoke to [place], [place] D. Will send in a 3 month supply of Eliquis 2.5mg  BID. Pt was recently in the hospital and her Scr was inconsistently less then 1.5.   Prescription refill sent.

## 2020-07-14 DIAGNOSIS — I5020 Unspecified systolic (congestive) heart failure: Secondary | ICD-10-CM

## 2020-07-14 DIAGNOSIS — J9691 Respiratory failure, unspecified with hypoxia: Secondary | ICD-10-CM

## 2020-07-14 LAB — RETICULOCYTES
Immature Retic Fract: 14.7 % (ref 2.3–15.9)
RBC.: 3.45 MIL/uL — ABNORMAL LOW (ref 3.87–5.11)
Retic Count, Absolute: 93.2 10*3/uL (ref 19.0–186.0)
Retic Ct Pct: 2.7 % (ref 0.4–3.1)

## 2020-07-14 LAB — COMPREHENSIVE METABOLIC PANEL
ALT: 27 U/L (ref 0–44)
ALT: 32 U/L (ref 0–44)
AST: 37 U/L (ref 15–41)
AST: 42 U/L — ABNORMAL HIGH (ref 15–41)
Albumin: 2.3 g/dL — ABNORMAL LOW (ref 3.5–5.0)
Albumin: 2.6 g/dL — ABNORMAL LOW (ref 3.5–5.0)
Alkaline Phosphatase: 59 U/L (ref 38–126)
Alkaline Phosphatase: 65 U/L (ref 38–126)
Anion gap: 10 (ref 5–15)
Anion gap: 11 (ref 5–15)
BUN: 19 mg/dL (ref 8–23)
BUN: 17 mg/dL (ref 8–23)
CO2: 25 mmol/L (ref 22–32)
CO2: 26 mmol/L (ref 22–32)
Calcium: 7.9 mg/dL — ABNORMAL LOW (ref 8.9–10.3)
Calcium: 8.3 mg/dL — ABNORMAL LOW (ref 8.9–10.3)
Chloride: 106 mmol/L (ref 98–111)
Chloride: 106 mmol/L (ref 98–111)
Creatinine, Ser: 1.67 mg/dL — ABNORMAL HIGH (ref 0.44–1.00)
Creatinine, Ser: 1.61 mg/dL — ABNORMAL HIGH (ref 0.44–1.00)
GFR calc Af Amer: 32 mL/min — ABNORMAL LOW (ref >60)
GFR calc Af Amer: 33 mL/min — ABNORMAL LOW (ref >60)
GFR calc non Af Amer: 28 mL/min — ABNORMAL LOW (ref >60)
GFR calc non Af Amer: 29 mL/min — ABNORMAL LOW (ref >60)
Glucose, Bld: 215 mg/dL — ABNORMAL HIGH (ref 70–99)
Glucose, Bld: 171 mg/dL — ABNORMAL HIGH (ref 70–99)
Potassium: 4.1 mmol/L (ref 3.5–5.1)
Potassium: 4.3 mmol/L (ref 3.5–5.1)
Sodium: 141 mmol/L (ref 135–145)
Sodium: 143 mmol/L (ref 135–145)
Total Bilirubin: 0.5 mg/dL (ref 0.3–1.2)
Total Bilirubin: 0.7 mg/dL (ref 0.3–1.2)
Total Protein: 5.7 g/dL — ABNORMAL LOW (ref 6.5–8.1)
Total Protein: 6.6 g/dL (ref 6.5–8.1)

## 2020-07-14 LAB — CBC WITH DIFFERENTIAL/PLATELET
Abs Immature Granulocytes: 0.02 10*3/uL (ref 0.00–0.07)
Basophils Absolute: 0 10*3/uL (ref 0.0–0.1)
Basophils Relative: 0 %
Eosinophils Absolute: 0.2 10*3/uL (ref 0.0–0.5)
Eosinophils Relative: 3 %
HCT: 34.3 % — ABNORMAL LOW (ref 36.0–46.0)
Hemoglobin: 10.2 g/dL — ABNORMAL LOW (ref 12.0–15.0)
Immature Granulocytes: 0 %
Lymphocytes Relative: 14 %
Lymphs Abs: 1.1 10*3/uL (ref 0.7–4.0)
MCH: 30.1 pg (ref 26.0–34.0)
MCHC: 29.7 g/dL — ABNORMAL LOW (ref 30.0–36.0)
MCV: 101.2 fL — ABNORMAL HIGH (ref 80.0–100.0)
Monocytes Absolute: 0.6 10*3/uL (ref 0.1–1.0)
Monocytes Relative: 8 %
Neutro Abs: 5.7 10*3/uL (ref 1.7–7.7)
Neutrophils Relative %: 75 %
Platelets: 116 10*3/uL — ABNORMAL LOW (ref 150–400)
RBC: 3.39 MIL/uL — ABNORMAL LOW (ref 3.87–5.11)
RDW: 14.8 % (ref 11.5–15.5)
WBC: 7.7 10*3/uL (ref 4.0–10.5)
nRBC: 0 % (ref 0.0–0.2)

## 2020-07-14 LAB — CBG MONITORING, ED
Glucose-Capillary: 130 mg/dL — ABNORMAL HIGH (ref 70–99)
Glucose-Capillary: 196 mg/dL — ABNORMAL HIGH (ref 70–99)
Glucose-Capillary: 213 mg/dL — ABNORMAL HIGH (ref 70–99)
Glucose-Capillary: 217 mg/dL — ABNORMAL HIGH (ref 70–99)

## 2020-07-14 LAB — PROCALCITONIN: Procalcitonin: 8.15 ng/mL

## 2020-07-14 LAB — MAGNESIUM: Magnesium: 2.2 mg/dL (ref 1.7–2.4)

## 2020-07-14 LAB — HEPARIN LEVEL (UNFRACTIONATED): Heparin Unfractionated: 0.75 IU/mL — ABNORMAL HIGH (ref 0.30–0.70)

## 2020-07-14 LAB — IRON AND TIBC
Iron: 20 ug/dL — ABNORMAL LOW (ref 28–170)
Saturation Ratios: 8 % — ABNORMAL LOW (ref 10.4–31.8)
TIBC: 262 ug/dL (ref 250–450)
UIBC: 242 ug/dL

## 2020-07-14 LAB — EXPECTORATED SPUTUM ASSESSMENT W GRAM STAIN, RFLX TO RESP C

## 2020-07-14 LAB — APTT
aPTT: 29 seconds (ref 24–36)
aPTT: 41 seconds — ABNORMAL HIGH (ref 24–36)

## 2020-07-14 LAB — FOLATE: Folate: 19.4 ng/mL (ref >5.9)

## 2020-07-14 LAB — URINE CULTURE: Culture: NO GROWTH

## 2020-07-14 LAB — TROPONIN I (HIGH SENSITIVITY): Troponin I (High Sensitivity): 454 ng/L (ref <18)

## 2020-07-14 LAB — BRAIN NATRIURETIC PEPTIDE: B Natriuretic Peptide: 1163.9 pg/mL — ABNORMAL HIGH (ref 0.0–100.0)

## 2020-07-14 LAB — FERRITIN: Ferritin: 208 ng/mL (ref 11–307)

## 2020-07-14 LAB — STREP PNEUMONIAE URINARY ANTIGEN: Strep Pneumo Urinary Antigen: NEGATIVE

## 2020-07-14 LAB — LACTIC ACID, PLASMA: Lactic Acid, Venous: 1.9 mmol/L (ref 0.5–1.9)

## 2020-07-14 LAB — HEMOGLOBIN A1C
Hgb A1c MFr Bld: 9 % — ABNORMAL HIGH (ref 4.8–5.6)
Mean Plasma Glucose: 211.6 mg/dL

## 2020-07-14 LAB — TSH: TSH: 1.099 u[IU]/mL (ref 0.350–4.500)

## 2020-07-14 LAB — VITAMIN B12: Vitamin B-12: 3092 pg/mL — ABNORMAL HIGH (ref 180–914)

## 2020-07-14 MED ORDER — VITAMIN D 25 MCG (1000 UNIT) PO TABS
1000.0000 [IU] | ORAL_TABLET | Freq: Every day | ORAL | Status: DC
  Administered 2020-07-14 – 2020-07-20 (×7): 1000 [IU] via ORAL
  Filled 2020-07-14 (×7): qty 1

## 2020-07-14 MED ORDER — VITAMIN B-12 1000 MCG PO TABS
1000.0000 ug | ORAL_TABLET | Freq: Every day | ORAL | Status: DC
  Administered 2020-07-14 – 2020-07-20 (×7): 1000 ug via ORAL
  Filled 2020-07-14 (×7): qty 1

## 2020-07-14 MED ORDER — INSULIN ASPART 100 UNIT/ML ~~LOC~~ SOLN
0.0000 [IU] | Freq: Three times a day (TID) | SUBCUTANEOUS | Status: DC
  Administered 2020-07-14: 2 [IU] via SUBCUTANEOUS
  Administered 2020-07-14 (×2): 3 [IU] via SUBCUTANEOUS
  Administered 2020-07-15 (×2): 2 [IU] via SUBCUTANEOUS
  Administered 2020-07-15 – 2020-07-16 (×3): 3 [IU] via SUBCUTANEOUS
  Administered 2020-07-16: 5 [IU] via SUBCUTANEOUS
  Administered 2020-07-17: 3 [IU] via SUBCUTANEOUS
  Administered 2020-07-17: 2 [IU] via SUBCUTANEOUS
  Administered 2020-07-17: 1 [IU] via SUBCUTANEOUS
  Administered 2020-07-18: 2 [IU] via SUBCUTANEOUS
  Administered 2020-07-18: 3 [IU] via SUBCUTANEOUS
  Administered 2020-07-19: 2 [IU] via SUBCUTANEOUS
  Administered 2020-07-19: 1 [IU] via SUBCUTANEOUS
  Administered 2020-07-20: 2 [IU] via SUBCUTANEOUS
  Administered 2020-07-20: 1 [IU] via SUBCUTANEOUS
  Administered 2020-07-20: 3 [IU] via SUBCUTANEOUS

## 2020-07-14 MED ORDER — FUROSEMIDE 20 MG PO TABS: 40.0000 mg | ORAL_TABLET | Freq: Once | ORAL | Status: DC

## 2020-07-14 MED ORDER — METOPROLOL TARTRATE 25 MG PO TABS
25.0000 mg | ORAL_TABLET | Freq: Two times a day (BID) | ORAL | Status: DC
  Administered 2020-07-14 (×2): 25 mg via ORAL
  Filled 2020-07-14 (×2): qty 1

## 2020-07-14 MED ORDER — LACTATED RINGERS IV SOLN: INTRAVENOUS | Status: DC

## 2020-07-14 MED ORDER — PANTOPRAZOLE SODIUM 40 MG PO TBEC
40.0000 mg | DELAYED_RELEASE_TABLET | Freq: Every day | ORAL | Status: DC
  Administered 2020-07-14 – 2020-07-20 (×7): 40 mg via ORAL
  Filled 2020-07-14 (×8): qty 1

## 2020-07-14 MED ORDER — SODIUM CHLORIDE 0.9 % IV SOLN
3.0000 g | Freq: Two times a day (BID) | INTRAVENOUS | Status: DC
  Administered 2020-07-14 – 2020-07-15 (×4): 3 g via INTRAVENOUS
  Filled 2020-07-14: qty 8
  Filled 2020-07-14: qty 3
  Filled 2020-07-14 (×4): qty 8
  Filled 2020-07-14: qty 3

## 2020-07-14 MED ORDER — APIXABAN 2.5 MG PO TABS
2.5000 mg | ORAL_TABLET | Freq: Two times a day (BID) | ORAL | Status: DC
  Administered 2020-07-14 – 2020-07-20 (×14): 2.5 mg via ORAL
  Filled 2020-07-14 (×15): qty 1

## 2020-07-14 NOTE — Consult Note (Addendum)
Cardiology Admission History and Physical:   Patient ID: [name] MRN: [id number]; DOB: [birth year]   Admission date: 07/13/2020  Primary Care Provider: [name], MD [facility] Cardiologist: Larae Grooms, MD  [facility] Electrophysiologist:  None   Chief Complaint:  unresponsiveness  Patient Profile:   [name] is a 86 y.o. female with HFrEF (EF 30% with dyssynchrony, severe LVH), HTN, SVT on amiodarone, CKD III, and recent COVID infection who presents after being found unresponsive, hypoglycemic.   History of Present Illness:   [name] was brought to the ER yesterday morning from the SNF when they were unable to arouse her yesterday morning. EMS was called and found her to be hypoglycemic to 50. She was initially given fluids and D50 in the ER and had returned to baseline. hsTN was checked and noted to be 41, 52 with EKG paced with LBBB.   She was planned to be discharged home, but upon re-evaluation was found to have had an episode of emesis with new hypoxia to the 70s. A repeat CXR performed demonstrated worsening edema v. Infiltrates and she shortly thereafter developed a fever to 101.8. She was started on IV abx and given fluids.   At the time of my exam, she was awake and arousable thought with BP 90/60. She was chest pain free. She was febrile to 101.8 and extremities were warm. She tells me that yesterday AM she developed some left sided pain underneath her left breast which she thinks responded to tylenol. She also noted some epigastric and SSCP yesterday but this was more difficult for her to characterize. She hadn't experienced any of these pains before.   Of note, she recently had a prolonged hospitalization with COVID and has continued to test positive.   Past Medical History:  Diagnosis Date  . Abnormal echocardiogram    a. possible mass on echo 05/2019 on PPM, b. no evidence of mass / atrial lead vegetation on follow up echo 06/2019  . Anemia    . Arthritis   . Breast cancer ([place]) 10/2018   Invasive ductal carcinoma  . Cancer ([place])   . Cataract   . Chronic combined systolic and diastolic CHF (congestive heart failure) ([place])    a. Previously diastolic but patient AVS from outside hospital listed systolic CHF and cardiomyopathy, records pending.  . CKD (chronic kidney disease), stage III   . Clotting disorder ([place])   . CVA (cerebral vascular accident) ([place])    residual right sided weakness and mild dysphagia  . Diabetes mellitus ([place])   . Dizziness and giddiness   . DVT (deep venous thrombosis) (HCC)    a. on anticoagulation for this.  . Essential hypertension   . LBBB (left bundle branch block)   . Mitral regurgitation    a. Mod MR by echo 2014.  . Muscular deconditioning   . Obesity   . Pacemaker   . Pericardial effusion   . SVT (supraventricular tachycardia) ([place])    a. In 2013 she had an EPS with ablation for SVT which did not eliminate the SVT completely. She also had bradycardia which limited medication. She was placed on amiodarone by Dr. Lovena Le.  . Thrombocytopenia ([place]) 11/21/2011    Past Surgical History:  Procedure Laterality Date  . EYE SURGERY    . SUPRAVENTRICULAR TACHYCARDIA ABLATION N/A 10/11/2012   Procedure: SUPRAVENTRICULAR TACHYCARDIA ABLATION;  Surgeon: Evans Lance, MD;  Location: [facility] CATH LAB;  Service: Cardiovascular;  Laterality: N/A;  Medications Prior to Admission: Prior to Admission medications   Medication Sig Start Date End Date Taking? Authorizing Provider  acetaminophen (TYLENOL) 500 MG tablet Take 1,000 mg by mouth every 6 (six) hours as needed for mild pain, moderate pain or headache.     [provider]  albuterol (VENTOLIN HFA) 108 (90 Base) MCG/ACT inhaler Inhale 2 puffs into the lungs every 4 (four) hours as needed for wheezing or shortness of breath. 07/13/20   Charlesetta Shanks, MD  amiodarone (PACERONE) 200 MG tablet Take one tablet by mouth daily Monday-Saturday. Do  NOT take Sunday 05/06/20   Evans Lance, MD  anastrozole (ARIMIDEX) 1 MG tablet TAKE 1 TABLET(1 MG) BY MOUTH DAILY Patient taking differently: Take 1 mg by mouth daily.  01/15/20   Nicholas Lose, MD  atorvastatin (LIPITOR) 20 MG tablet Take 1 tablet (20 mg total) by mouth at bedtime. 06/14/19   [name], MD  benzonatate (TESSALON) 100 MG capsule Take 1 capsule (100 mg total) by mouth every 8 (eight) hours. Swallow capsule whole.  Do not chew 07/13/20   Charlesetta Shanks, MD  carvedilol (COREG) 12.5 MG tablet Take 1 tablet (12.5 mg total) by mouth 2 (two) times daily with a meal. 06/30/20   [name], MD  Cholecalciferol (VITAMIN D-3 PO) Take 1 capsule by mouth daily after breakfast.    [provider]  Cyanocobalamin (B-12 PO) Take 1 tablet by mouth daily after breakfast.     [provider]  diclofenac sodium (VOLTAREN) 1 % GEL Apply 2 g topically 4 (four) times daily. Patient taking differently: Apply 2 g topically 4 (four) times daily as needed (pain).  10/30/18   Rodriguez-Southworth, Sunday Spillers, PA-C  ELIQUIS 2.5 MG TABS tablet TAKE 1 TABLET(2.5 MG) BY MOUTH TWICE DAILY 07/13/20   Dunn, Nedra Hai, PA-C  famotidine (PEPCID) 40 MG tablet Take 40 mg by mouth 2 (two) times daily. 05/22/20   [provider]  ferrous sulfate 325 (65 FE) MG tablet Take 325 mg by mouth daily with breakfast.    [provider]  HUMALOG KWIKPEN 200 UNIT/ML KwikPen INJECT 3 UNITS UNDER THE SKIN BEFORE BREAKFAST, LUNCH, DINNER IF SUGARS ARE GREATER THAN 175. Patient taking differently: Inject 3 Units into the skin 3 (three) times daily as needed (Blood Sugar > 175).  02/11/20   [name], MD  insulin detemir (LEVEMIR) 100 UNIT/ML injection Inject 0.35 mLs (35 Units total) into the skin at bedtime. 07/01/20   [name], MD  ipratropium (ATROVENT HFA) 17 MCG/ACT inhaler Inhale 2 puffs into the lungs every 6 (six) hours as needed for wheezing. 07/13/20   Charlesetta Shanks, MD   isosorbide-hydrALAZINE (BIDIL) 20-37.5 MG tablet Take 1 tablet by mouth 3 (three) times daily. 02/28/20   Jettie Booze, MD  mirtazapine (REMERON) 7.5 MG tablet TAKE 1 TABLET(7.5 MG) BY MOUTH AT BEDTIME Patient taking differently: Take 7.5 mg by mouth at bedtime.  04/14/20   [name], MD  MYRBETRIQ 50 MG TB24 tablet Take 50 mg by mouth daily. 05/24/20   [provider]  omeprazole (PRILOSEC) 40 MG capsule TAKE 1 CAPSULE BY MOUTH DAILY BEFORE A MEAL Patient taking differently: Take 40 mg by mouth in the morning, at noon, and at bedtime.  12/11/19   [name], MD  oxybutynin (DITROPAN) 5 MG tablet TAKE 1 TABLET(5 MG) BY MOUTH TWICE DAILY Patient taking differently: Take 5 mg by mouth 2 (two) times daily.  12/23/19   [name], MD  pregabalin Recardo Evangelist)  75 MG capsule Take 1 capsule (75 mg total) by mouth at bedtime. 06/30/20   Minette Brine, FNP  Semaglutide,0.25 or 0.5MG /DOS, (OZEMPIC, 0.25 OR 0.5 MG/DOSE,) 2 MG/1.5ML SOPN Inject 0.25 mg into the skin once a week. Take on Wednesday    [provider]  torsemide (DEMADEX) 20 MG tablet Take 1 tablet (20 mg total) by mouth 2 (two) times daily. You may take an extra 20 mg tablet daily as needed for swelling 01/14/20   Jettie Booze, MD     Allergies:    Allergies  Allergen Reactions  . Aspirin Itching  . Penicillins Itching and Rash    DID THE REACTION INVOLVE: Swelling of the face/tongue/throat, SOB, or low BP? Yes Sudden or severe rash/hives, skin peeling, or the inside of the mouth or nose? No Did it require medical treatment? Yes When did it last happen?"More than 10 years ago" If all above answers are "NO", may proceed with cephalosporin use.     Social History:   Social History   Socioeconomic History  . Marital status: Widowed    Spouse name: Not on file  . Number of children: Not on file  . Years of education: Not on file  . Highest education level: Not on file  Occupational History  .  Occupation: retired  Tobacco Use  . Smoking status: Never Smoker  . Smokeless tobacco: Never Used  Vaping Use  . Vaping Use: Never used  Substance and Sexual Activity  . Alcohol use: No  . Drug use: No  . Sexual activity: Not Currently  Other Topics Concern  . Not on file  Social History Narrative   Lives at home with grandchild, daughter comes and help    Social Determinants of Health   Financial Resource Strain: Low Risk   . Difficulty of Paying Living Expenses: Not hard at all  Food Insecurity: No Food Insecurity  . Worried About Charity fundraiser in the Last Year: Never true  . Ran Out of Food in the Last Year: Never true  Transportation Needs: No Transportation Needs  . Lack of Transportation (Medical): No  . Lack of Transportation (Non-Medical): No  Physical Activity: Inactive  . Days of Exercise per Week: 0 days  . Minutes of Exercise per Session: 0 min  Stress: No Stress Concern Present  . Feeling of Stress : Only a little  Social Connections:   . Frequency of Communication with Friends and Family: Not on file  . Frequency of Social Gatherings with Friends and Family: Not on file  . Attends Religious Services: Not on file  . Active Member of Clubs or Organizations: Not on file  . Attends Archivist Meetings: Not on file  . Marital Status: Not on file  Intimate Partner Violence:   . Fear of Current or Ex-Partner: Not on file  . Emotionally Abused: Not on file  . Physically Abused: Not on file  . Sexually Abused: Not on file    Family History:   The patient's family history includes Breast cancer in her maternal aunt; Cancer in her father; Heart attack in her brother; Hypertension in her brother, daughter, and sister; Stroke in her mother and sister.    ROS:  Please see the history of present illness.  All other ROS reviewed and negative.     Physical Exam/Data:   Vitals:   07/13/20 2330 07/13/20 2345 07/14/20 0000 07/14/20 0015  BP: (!) 86/50  (!) 88/66 (!) 94/56 94/67  Pulse:  85 82 86   Resp: (!) 23 17 (!) 23 16  Temp:  99.4 F (37.4 C)    TempSrc:  Oral    SpO2: 100% 100% 100%   Weight:      Height:        Intake/Output Summary (Last 24 hours) at 07/14/2020 0028 Last data filed at 07/14/2020 0000 Gross per 24 hour  Intake 850 ml  Output 450 ml  Net 400 ml   Last 3 Weights 07/13/2020 06/30/2020 06/29/2020  Weight (lbs) 191 lb 12.8 oz 192 lb 0.3 oz 191 lb 13.1 oz  Weight (kg) 87 kg 87.1 kg 87.008 kg     Body mass index is 31.92 kg/m.  General:  Ill appearing african american woman, pleasant, in mild distress.  HEENT: normal Neck: no JVD  Cardiac:  normal S1, S2; RRR; no murmur  Lungs:  Distant breath sounds anteriorly.  Abd: soft, nontender, no hepatomegaly  Ext: 1+ edema, warm.  Skin: warm and dry   EKG:  The ECG that was done demonstrates paced LBBB.   Relevant CV Studies: 1. Left ventricular ejection fraction, by estimation, is 30%. The left  ventricle has moderate to severely decreased function. The left ventricle  demonstrates global hypokinesis with septal-lateral dyssynchrony. There is  severe left ventricular  hypertrophy. Left ventricular diastolic parameters are consistent with  Grade I diastolic dysfunction (impaired relaxation).  2. Right ventricular systolic function is mildly reduced. The right  ventricular size is normal. Tricuspid regurgitation signal is inadequate  for assessing PA pressure.  3. Left atrial size was severely dilated.  4. The mitral valve is normal in structure. Trivial mitral valve  regurgitation. No evidence of mitral stenosis.  5. The aortic valve is tricuspid. Aortic valve regurgitation is not  visualized. Mild aortic valve sclerosis is present, with no evidence of  aortic valve stenosis.  6. Aortic dilatation noted. There is mild dilatation of the aortic root  measuring 39 mm.  7. The inferior vena cava is dilated in size with <50% respiratory  variability,  suggesting right atrial pressure of 15 mmHg.   Laboratory Data:  High Sensitivity Troponin:   Recent Labs  Lab 06/22/20 1621 06/22/20 1735 07/13/20 0934 07/13/20 1213 07/13/20 1836  TROPONINIHS 85* 70* 41* 52* 466*      Chemistry Recent Labs  Lab 07/13/20 0934 07/13/20 0943  NA 144 144  K 3.1* 3.2*  CL 104  --   CO2 29  --   GLUCOSE 71  --   BUN 19  --   CREATININE 1.71*  --   CALCIUM 8.3*  --   GFRNONAA 27*  --   GFRAA 31*  --   ANIONGAP 11  --     Recent Labs  Lab 07/13/20 0934  PROT 6.5  ALBUMIN 2.8*  AST 27  ALT 22  ALKPHOS 62  BILITOT 0.5   Hematology Recent Labs  Lab 07/13/20 0934 07/13/20 0943  WBC 4.2  --   RBC 3.29*  --   HGB 9.9* 9.9*  HCT 32.2* 29.0*  MCV 97.9  --   MCH 30.1  --   MCHC 30.7  --   RDW 14.6  --   PLT 111*  --    BNP Recent Labs  Lab 07/13/20 1850  BNP 678.9*    DDimer No results for input(s): DDIMER in the last 168 hours.   Radiology/Studies:  CT Head Wo Contrast  Result Date: 07/13/2020 CLINICAL DATA:  Mental status change. EXAM: CT  HEAD WITHOUT CONTRAST TECHNIQUE: Contiguous axial images were obtained from the base of the skull through the vertex without intravenous contrast. COMPARISON:  08/10/2019 FINDINGS: Brain: No evidence for acute hemorrhage, mass lesion, midline shift, hydrocephalus or large infarct. Small calcifications in the basal ganglia. Vascular: No hyperdense vessel or unexpected calcification. Skull: Normal. Negative for fracture or focal lesion. Sinuses/Orbits: Mucosal thickening in the maxillary sinuses, right side greater than left. Mild mucosal thickening involving the left frontal sinus and ethmoid air cells. Other: None IMPRESSION: 1. No acute intracranial abnormality. 2. Mild paranasal sinus disease. Electronically Signed   By: Markus Daft M.D.   On: 07/13/2020 10:45   DG Chest Portable 1 View  Result Date: 07/13/2020 CLINICAL DATA:  Hypoxia EXAM: PORTABLE CHEST 1 VIEW COMPARISON:  07/13/2020 at  9:51 a.m. FINDINGS: Single frontal view of the chest demonstrates stable dual lead pacemaker. Cardiac silhouette is unchanged. Since the prior exam, there is progressive central vascular congestion with increased interstitial and ground-glass opacity within the perihilar distribution. Trace right pleural effusion. No pneumothorax. IMPRESSION: 1. Worsening bilateral perihilar interstitial and ground-glass opacities, favor mild edema over progressive pneumonia. Electronically Signed   By: Randa Ngo M.D.   On: 07/13/2020 17:11   DG Chest Port 1 View  Result Date: 07/13/2020 CLINICAL DATA:  Unresponsive and cough. EXAM: PORTABLE CHEST 1 VIEW COMPARISON:  06/25/2020 FINDINGS: Stable appearance of the left dual chamber cardiac pacemaker. Heart size is upper limits of normal but stable. Evidence for a soft tissue Mach line overlying the left upper chest. No evidence to suggest a pneumothorax. No focal airspace disease or lung consolidation. Trachea is deviated towards the right and this is unchanged. IMPRESSION: Slightly improved aeration at the lung bases compared to the prior examination. No acute cardiopulmonary disease. Electronically Signed   By: Markus Daft M.D.   On: 07/13/2020 10:35    Assessment and Plan:   1. Hypoxemic Respiratory Failure, Rising Troponin -- Unclear course of events this evening, but currently with fever, worsening hypoxemia, and R>L infiltrates on CXR after episode of emesis. What precipitated this event is unclear but hsTn in this setting 70 -> 466.  -- Suspect Type II MI (demand) in setting of impending sepsis.  -- However, given ischemia very difficult to read in EKG (but does not meet [place] criteria) would consider possibility of ACS given patients' reports of CP yesterday.  -- Would trend troponin to peak.  -- Heparin for ACS; ensure ASA on board.  -- Repeat limited TTE to assess function.  -- Continue lipitor -- Agree with panculture and search for source.   2.  Systolic HF (LVEF 48% 10/8561 -> 30% 05/2020).  -- Newly reduced LVEF with likely chronic pacing requirement and dyssynchrony.  -- Once sepsis / infection has been addressed will need to be on aggressive GDMT with consideration of CRT upgrade.  -- May need ischemic evaluation once acute illness passes.  -- Hold Coreg and bidil given hypotension, fevers.  -- Caution with aggressive fluid resuscitation given low EF.   3. Hx of DVT -- On eliquis @ home; heparin now given above.   3. SVT s/p PPM -- Continue amiodarone.   We will continue to follow. Please page if chest pain or other clinical changes.   Signed, Milus Banister, MD  07/14/2020 12:28 AM

## 2020-07-14 NOTE — ED Notes (Signed)
Pt resting with eyes closed. Easily arousable with light voice. Pt's resp e/u. Pt asks for water. Given water. Warm blankets given. Pt aware she's in the hospital and waiting for a bed upstairs. Call light in pts reach.

## 2020-07-14 NOTE — Evaluation (Signed)
Clinical/Bedside Swallow Evaluation Patient Details  Name: [name] MRN: [id number] Date of Birth: 05-22-1933  Today's Date: 07/14/2020 Time: SLP Start Time (ACUTE ONLY): 0900 SLP Stop Time (ACUTE ONLY): 0935 SLP Time Calculation (min) (ACUTE ONLY): 35 min  Past Medical History:  Past Medical History:  Diagnosis Date  . Abnormal echocardiogram    a. possible mass on echo 05/2019 on PPM, b. no evidence of mass / atrial lead vegetation on follow up echo 06/2019  . Anemia   . Arthritis   . Breast cancer ([place]) 10/2018   Invasive ductal carcinoma  . Cancer ([place])   . Cataract   . Chronic combined systolic and diastolic CHF (congestive heart failure) ([place])    a. Previously diastolic but patient AVS from outside hospital listed systolic CHF and cardiomyopathy, records pending.  . CKD (chronic kidney disease), stage III   . Clotting disorder ([place])   . CVA (cerebral vascular accident) ([place])    residual right sided weakness and mild dysphagia  . Diabetes mellitus ([place])   . Dizziness and giddiness   . DVT (deep venous thrombosis) ([facility])    a. on anticoagulation for this.  . Essential hypertension   . LBBB (left bundle branch block)   . Mitral regurgitation    a. Mod MR by echo 2014.  . Muscular deconditioning   . Obesity   . Pacemaker   . Pericardial effusion   . SVT (supraventricular tachycardia) ([place])    a. In 2013 she had an EPS with ablation for SVT which did not eliminate the SVT completely. She also had bradycardia which limited medication. She was placed on amiodarone by Dr. Lovena Le.  . Thrombocytopenia ([place]) 11/21/2011   Past Surgical History:  Past Surgical History:  Procedure Laterality Date  . EYE SURGERY    . SUPRAVENTRICULAR TACHYCARDIA ABLATION N/A 10/11/2012   Procedure: SUPRAVENTRICULAR TACHYCARDIA ABLATION;  Surgeon: Evans Lance, MD;  Location: [facility] CATH LAB;  Service: Cardiovascular;  Laterality: N/A;   HPI:  86yo female admitted 07/13/20. PMH: DM2,  history of  breast cancer on anastrozole, SVT status post ablation with permanent pacemaker, chronic combined heart failure, chronic kidney disease stage IIIb, history of a stroke and recurrent DVT on Eliquis, recent COVID infection and PNA   Assessment / Plan / Recommendation Clinical Impression  Pt seen at bedside in ED. She is awake and alert. Pt was seen for bedside swallow evaluation earlier this month, and was placed on Dys2 solids and thin liquids, meds in puree. Today, pt continues to report preference of soft chopped solids due to lack of lower dentition. She also reports a history of esophageal dysphagia, with dilation completed about a year ago. She indicates taiking a PPI daily. Pt reports no pain unless she coughs, and indicates globus sensation with solids. Pt accepted trials of thin liquid, puree, and solid textures. No overt s/s aspiration observed on any consistency, however, pt reported globus sensation after puree and solid textures. Instrumental study would be beneficia - would begin with esophageal work up given pt history of GERD and presentation today. SLP will follow up to assess diet tolerance and coninue education. RN informed.      Aspiration Risk  Mild aspiration risk;Moderate aspiration risk    Diet Recommendation Dysphagia 2 (Fine chop);Thin liquid   Liquid Administration via: Cup Medication Administration: Whole meds with puree Supervision: Patient able to self feed;Intermittent supervision to cue for compensatory strategies Compensations: Minimize environmental distractions;Slow rate;Small sips/bites Postural Changes: Seated upright at 90  degrees;Remain upright for at least 30 minutes after po intake    Other  Recommendations Recommended Consults: Consider esophageal assessment Oral Care Recommendations: Oral care BID   Follow up Recommendations Other (comment)      Frequency and Duration min 1 x/week  1 week;2 weeks       Prognosis Prognosis for Safe Diet  Advancement: Good      Swallow Study   General Date of Onset: 07/13/20 HPI: 86yo female admitted 07/13/20. PMH: DM2,  history of breast cancer on anastrozole, SVT status post ablation with permanent pacemaker, chronic combined heart failure, chronic kidney disease stage IIIb, history of a stroke and recurrent DVT on Eliquis, recent COVID infection and PNA Type of Study: Bedside Swallow Evaluation Previous Swallow Assessment: BSE 06/26/20 Diet Prior to this Study: Regular;Thin liquids Temperature Spikes Noted:  (99) Respiratory Status: Nasal cannula History of Recent Intubation: No Behavior/Cognition: Alert;Cooperative;Pleasant mood Oral Cavity Assessment: Within Functional Limits Oral Care Completed by SLP: No Oral Cavity - Dentition: Dentures, top Self-Feeding Abilities: Able to feed self Patient Positioning: Upright in bed Baseline Vocal Quality: Normal Volitional Cough: Strong Volitional Swallow: Able to elicit    Oral/Motor/Sensory Function Overall Oral Motor/Sensory Function: Within functional limits   Thin Liquid Thin Liquid: Within functional limits    Puree Puree: Within functional limits Presentation: Self Fed;Spoon   Solid     Solid: Within functional limits     Stacie Knutzen B. Quentin Ore, [facility] [place] Speech Language Pathologist Office: [phone] Pager: [phone]  Shonna Chock 07/14/2020,9:52 AM

## 2020-07-14 NOTE — Progress Notes (Signed)
Pharmacy Antibiotic Note  [name] is a 86 y.o. female admitted on 07/13/2020 with COVID-19 infection and aspiration pna.  Pharmacy has been consulted for Unasyn dosing.  Plan: Unasyn 3g IV every 12 hours Monitor renal function, clinical progression and LOT  Height: 5\' 5"  (165.1 cm) Weight: 87 kg (191 lb 12.8 oz) IBW/kg (Calculated) : 57  Temp (24hrs), Avg:99.1 F (37.3 C), Min:96.5 F (35.8 C), Max:101.8 F (38.8 C)  Recent Labs  Lab 07/13/20 0934 07/13/20 0935 07/13/20 1213 07/13/20 1850 07/13/20 2116 07/13/20 2316 07/14/20 0020  WBC 4.2  --   --   --   --   --   --   CREATININE 1.71*  --   --   --   --   --  1.67*  LATICACIDVEN  --  0.8 1.1 1.7 0.5 1.9  --     Estimated Creatinine Clearance: 26.8 mL/min (A) (by C-G formula based on SCr of 1.67 mg/dL (H)).    Allergies  Allergen Reactions   Aspirin Itching   Penicillins Itching and Rash    DID THE REACTION INVOLVE: Swelling of the face/tongue/throat, SOB, or low BP? Yes Sudden or severe rash/hives, skin peeling, or the inside of the mouth or nose? No Did it require medical treatment? Yes When did it last happen?"More than 10 years ago" If all above answers are "NO", may proceed with cephalosporin use.     Bertis Ruddy, PharmD Clinical Pharmacist ED Pharmacist Phone # [phone] 07/14/2020 9:27 AM

## 2020-07-14 NOTE — Progress Notes (Signed)
[place] for IV heparin Indication: chest pain/ACS  Allergies  Allergen Reactions  . Aspirin Itching  . Penicillins Itching and Rash    DID THE REACTION INVOLVE: Swelling of the face/tongue/throat, SOB, or low BP? Yes Sudden or severe rash/hives, skin peeling, or the inside of the mouth or nose? No Did it require medical treatment? Yes When did it last happen?"More than 10 years ago" If all above answers are "NO", may proceed with cephalosporin use.     Patient Measurements: Height: 5\' 5"  (165.1 cm) Weight: 87 kg (191 lb 12.8 oz) IBW/kg (Calculated) : 57 Heparin Dosing Weight: 76kg  Vital Signs: Temp: 99.4 F (37.4 C) (09/20 2345) Temp Source: Oral (09/20 2345) BP: 101/53 (09/21 0730) Pulse Rate: 71 (09/21 0730)  Labs: Recent Labs    07/13/20 0934 07/13/20 0934 07/13/20 0943 07/13/20 1213 07/13/20 1836 07/14/20 0020  HGB 9.9*  --  9.9*  --   --   --   HCT 32.2*  --  29.0*  --   --   --   PLT 111*  --   --   --   --   --   APTT  --   --   --   --   --  29  LABPROT 13.6  --   --   --   --   --   INR 1.1  --   --   --   --   --   CREATININE 1.71*  --   --   --   --  1.67*  TROPONINIHS 41*   < >  --  52* 466* 454*   < > = values in this interval not displayed.    Estimated Creatinine Clearance: 26.8 mL/min (A) (by C-G formula based on SCr of 1.67 mg/dL (H)).   Medical History: Past Medical History:  Diagnosis Date  . Abnormal echocardiogram    a. possible mass on echo 05/2019 on PPM, b. no evidence of mass / atrial lead vegetation on follow up echo 06/2019  . Anemia   . Arthritis   . Breast cancer ([place]) 10/2018   Invasive ductal carcinoma  . Cancer ([place])   . Cataract   . Chronic combined systolic and diastolic CHF (congestive heart failure) ([place])    a. Previously diastolic but patient AVS from outside hospital listed systolic CHF and cardiomyopathy, records pending.  . CKD (chronic kidney disease), stage III   .  Clotting disorder ([place])   . CVA (cerebral vascular accident) ([place])    residual right sided weakness and mild dysphagia  . Diabetes mellitus ([place])   . Dizziness and giddiness   . DVT (deep venous thrombosis) ([facility])    a. on anticoagulation for this.  . Essential hypertension   . LBBB (left bundle branch block)   . Mitral regurgitation    a. Mod MR by echo 2014.  . Muscular deconditioning   . Obesity   . Pacemaker   . Pericardial effusion   . SVT (supraventricular tachycardia) ([place])    a. In 2013 she had an EPS with ablation for SVT which did not eliminate the SVT completely. She also had bradycardia which limited medication. She was placed on amiodarone by Dr. Lovena Le.  . Thrombocytopenia ([place]) 11/21/2011    Medications:  Infusions:  . ampicillin-sulbactam (UNASYN) IV    . heparin 1,000 Units/hr (07/14/20 0002)    Assessment: 87yoF presenting with possible ACS. Troponins have elevated since admission, now  466. EKG WNL. Hemoglobin is 9.9, PLT ~100. Last dose of apixaban was 9/20 AM. ECHO to be performed 9/21 AM.   Now to switch back to eliquis, on 2.5 mg BID PTA for Age + SCr  Goal of Therapy:  Heparin level 0.3-0.7 units/ml aPTT 66-102 seconds Monitor platelets by anticoagulation protocol: Yes   Plan:  D/c heparin gtt Give Eliquis 2.5 mg PO BID  Bertis Ruddy, PharmD Clinical Pharmacist ED Pharmacist Phone # [phone] 07/14/2020 9:29 AM

## 2020-07-14 NOTE — Progress Notes (Signed)
PROGRESS NOTE                                                                                                                                                                                                             Patient Demographics:    [name], is a 86 y.o. female, DOB - 06/11/1933, FXT:[id number]  Outpatient Primary MD for the patient is [name], MD    LOS - 1  Admit date - 07/13/2020    Chief Complaint  Patient presents with  . Hypoglycemia       Brief Narrative - [name] is a 86 y.o. female with medical history significant of DM2, history of breast cancer on anastrozole, SVT status post ablation with permanent pacemaker, chronic combined heart failure, chronic kidney disease stage IIIb, history of a stroke and recurrent DVT on Eliquis , recent COVID pneumonia and charge to a nursing home, on the day of admission she was found to have low blood sugars at nursing home due to not eating well and being on insulin and became briefly unresponsive, she was brought to the hospital for further care.  CT of the head was nonacute, she was also noted to be slightly hypoxic in the ER with mild fever and there was suspicion that patient could have aspiration pneumonia due to her decreased mental status.   Subjective:    Tranice Mom today has, No headache, No chest pain, No abdominal pain - No Nausea, No new weakness tingling or numbness, no SOB.   Assessment  & Plan :     1. Recent fully treated COVID-19 pneumonia - resolved.  Encouraged the patient to sit up in chair in the daytime use I-S and flutter valve for pulmonary toiletry and then prone in bed when at night.  Will advance activity and titrate down oxygen as possible.  SpO2: 100 % O2 Flow Rate (L/min): 2 L/min  Recent Labs  Lab 07/13/20 0934 07/13/20 0935 07/13/20 0940 07/13/20 1213 07/13/20 1850 07/13/20 2116 07/13/20 2316 07/14/20 0020  WBC  4.2  --   --   --   --   --   --   --   PLT 111*  --   --   --   --   --   --   --   BNP  --   --   --   --  678.9*  --   --   --   AST 27  --   --   --   --   --   --  37  ALT 22  --   --   --   --   --   --  27  ALKPHOS 62  --   --   --   --   --   --  59  BILITOT 0.5  --   --   --   --   --   --  0.5  ALBUMIN 2.8*  --   --   --   --   --   --  2.3*  INR 1.1  --   --   --   --   --   --   --   LATICACIDVEN  --  0.8  --  1.1 1.7 0.5 1.9  --   SARSCOV2NAA  --   --  POSITIVE*  --   --   --   --   --      2.  New aspiration pneumonia.  Likely due to decreased mentation from hypoglycemia, placed on Unasyn, MRSA PCR screen, follow procalcitonin and blood cultures, speech evaluation, mentation has improved for now soft diet with feeding assistance and aspiration precautions.  Monitor closely.  3.  Metabolic encephalopathy due to hypoglycemia from poor oral intake at SNF and being on insulin.  For now sliding scale and monitor CBGs closely.  Lab Results  Component Value Date   HGBA1C 9.1 (H) 06/22/2020   CBG (last 3)  Recent Labs    07/13/20 1843 07/14/20 0017 07/14/20 0826  GLUCAP 306* 196* 130*    4.  Hypertension.  Pressure slightly soft, low-dose beta-blocker if tolerated by blood pressure, gentle IV fluids and monitor.  5.  Dehydration causing AKI, underlying CKD stage IIIb.  Baseline creatinine around 1.5.  Gently hydrate and monitor avoid nephrotoxins.  6.  Chronic left bundle branch block with non-ACS pattern troponin rise.  No chest pain, not a candidate for invasive procedures, I think we can pursue her home dose Eliquis at this point.  Beta-blocker and statin as tolerated.  7.  Chronic systolic and diastolic CHF.  EF 30% on recent echocardiogram.  Currently dehydrated, gentle IV fluids and hold Lasix, monitor fluid status closely.  8.  History of DVT.  Switch back to Eliquis.  9.  Previous CVA.  On combination of Eliquis along with statin for secondary prevention.  10.   History of SVT, pacemaker placement.  Patient apparently on amiodarone for SVT, continue, no documentation of A. fib that I can find.  11.  Chronic weakness and deconditioning.  Wheelchair-bound at baseline per daughter, PT OT and SNF.  12.  Metabolic encephalopathy caused by hypoglycemia, improved, head CT nonacute, monitor with supportive care.  Minimize benzodiazepines and narcotics.     Condition -   Guarded  Family Communication  : daughter [phone] on 07/14/20  Code Status :  Full  Consults  :  None  Procedures  :    CT Head - no acute changes  PUD Prophylaxis : Zantac  Disposition Plan  :    Status is: Inpatient  Remains inpatient appropriate because:IV treatments appropriate due to intensity of illness or inability to take PO   Dispo: The patient is from: SNF              Anticipated d/c is to: SNF  Anticipated d/c date is: > 3 days              Patient currently is not medically stable to d/c.   DVT Prophylaxis  :   Heparin gtt  Lab Results  Component Value Date   PLT 111 (L) 07/13/2020    Diet :  Diet Order            DIET SOFT Room service appropriate? Yes; Fluid consistency: Thin  Diet effective now                  Inpatient Medications  Scheduled Meds: . amiodarone  200 mg Oral Daily  . anastrozole  1 mg Oral Daily  . atorvastatin  20 mg Oral QHS  . cholecalciferol  1,000 Units Oral Daily  . famotidine  10 mg Oral Daily  . ferrous sulfate  325 mg Oral Q breakfast  . furosemide  40 mg Oral Once  . insulin aspart  0-9 Units Subcutaneous TID WC  . pregabalin  75 mg Oral QHS  . vitamin B-12  1,000 mcg Oral Daily   Continuous Infusions: . ampicillin-sulbactam (UNASYN) IV    . heparin 1,000 Units/hr (07/14/20 0002)   PRN Meds:.acetaminophen **OR** acetaminophen, [DISCONTINUED] ondansetron **OR** ondansetron (ZOFRAN) IV  Antibiotics  :    Anti-infectives (From admission, onward)   Start     Dose/Rate Route Frequency  Ordered Stop   07/14/20 1900  cefTRIAXone (ROCEPHIN) 1 g in sodium chloride 0.9 % 100 mL IVPB  Status:  Discontinued        1 g 200 mL/hr over 30 Minutes Intravenous Every 24 hours 07/13/20 2002 07/14/20 0924   07/14/20 1800  azithromycin (ZITHROMAX) 500 mg in sodium chloride 0.9 % 250 mL IVPB  Status:  Discontinued        500 mg 250 mL/hr over 60 Minutes Intravenous Every 24 hours 07/13/20 2010 07/14/20 0924   07/14/20 1000  Ampicillin-Sulbactam (UNASYN) 3 g in sodium chloride 0.9 % 100 mL IVPB        3 g 200 mL/hr over 30 Minutes Intravenous Every 12 hours 07/14/20 0928     07/13/20 1815  cefTRIAXone (ROCEPHIN) 1 g in sodium chloride 0.9 % 100 mL IVPB        1 g 200 mL/hr over 30 Minutes Intravenous  Once 07/13/20 1803 07/13/20 2003   07/13/20 1815  azithromycin (ZITHROMAX) 500 mg in sodium chloride 0.9 % 250 mL IVPB        500 mg 250 mL/hr over 60 Minutes Intravenous  Once 07/13/20 1803 07/13/20 2114       Time Spent in minutes  30   Lala Lund M.D on 07/14/2020 at 9:28 AM  To page go to [email or web address] - password [facility]  [facility] -  Office  [phone]   See all Orders from today for further details    Objective:   Vitals:   07/14/20 0515 07/14/20 0645 07/14/20 0700 07/14/20 0730  BP: (!) 142/130 (!) 102/50 127/66 (!) 101/53  Pulse: 81 71 74 71  Resp: (!) 30 (!) 26 (!) 27 (!) 24  Temp:      TempSrc:      SpO2: 100% 100% 100% 100%  Weight:      Height:        Wt Readings from Last 3 Encounters:  07/13/20 87 kg  06/30/20 87.1 kg  06/19/20 87.1 kg     Intake/Output Summary (Last 24 hours) at 07/14/2020 9326 Last data filed  at 07/14/2020 0000 Gross per 24 hour  Intake 850 ml  Output 450 ml  Net 400 ml     Physical Exam  Awake, mildly confused, No new F.N deficits,   [place].AT,PERRAL Supple Neck,No JVD, No cervical lymphadenopathy appriciated.  Symmetrical Chest wall movement, Good air movement bilaterally, CTAB RRR,No Gallops,Rubs or new Murmurs,  No Parasternal Heave +ve B.Sounds, Abd Soft, No tenderness, No organomegaly appriciated, No rebound - guarding or rigidity. No Cyanosis, Clubbing or edema, No new Rash or bruise     Data Review:    CBC Recent Labs  Lab 07/13/20 0934 07/13/20 0943  WBC 4.2  --   HGB 9.9* 9.9*  HCT 32.2* 29.0*  PLT 111*  --   MCV 97.9  --   MCH 30.1  --   MCHC 30.7  --   RDW 14.6  --   LYMPHSABS 0.6*  --   MONOABS 0.5  --   EOSABS 0.2  --   BASOSABS 0.0  --     Recent Labs  Lab 07/13/20 0934 07/13/20 0935 07/13/20 0943 07/13/20 1213 07/13/20 1850 07/13/20 2116 07/13/20 2316 07/14/20 0020  NA 144  --  144  --   --   --   --  141  K 3.1*  --  3.2*  --   --   --   --  4.1  CL 104  --   --   --   --   --   --  106  CO2 29  --   --   --   --   --   --  25  GLUCOSE 71  --   --   --   --   --   --  215*  BUN 19  --   --   --   --   --   --  19  CREATININE 1.71*  --   --   --   --   --   --  1.67*  CALCIUM 8.3*  --   --   --   --   --   --  7.9*  AST 27  --   --   --   --   --   --  37  ALT 22  --   --   --   --   --   --  27  ALKPHOS 62  --   --   --   --   --   --  59  BILITOT 0.5  --   --   --   --   --   --  0.5  ALBUMIN 2.8*  --   --   --   --   --   --  2.3*  LATICACIDVEN  --  0.8  --  1.1 1.7 0.5 1.9  --   INR 1.1  --   --   --   --   --   --   --   BNP  --   --   --   --  678.9*  --   --   --     ------------------------------------------------------------------------------------------------------------------ No results for input(s): CHOL, HDL, LDLCALC, TRIG, CHOLHDL, LDLDIRECT in the last 72 hours.  Lab Results  Component Value Date   HGBA1C 9.1 (H) 06/22/2020   ------------------------------------------------------------------------------------------------------------------ No results for input(s): TSH, T4TOTAL, T3FREE, THYROIDAB in the last 72 hours.  Invalid input(s): FREET3  Cardiac Enzymes No results for  input(s): CKMB, TROPONINI, MYOGLOBIN in the last 168  hours.  Invalid input(s): CK ------------------------------------------------------------------------------------------------------------------    Component Value Date/Time   BNP 678.9 (H) 07/13/2020 1850   BNP 178.7 (H) 01/19/2016 1212    Micro Results Recent Results (from the past 240 hour(s))  SARS Coronavirus 2 by RT PCR (hospital order, performed in [facility] hospital lab) Nasopharyngeal Nasopharyngeal Swab     Status: Abnormal   Collection Time: 07/13/20  9:40 AM   Specimen: Nasopharyngeal Swab  Result Value Ref Range Status   SARS Coronavirus 2 POSITIVE (A) NEGATIVE Final    Comment: emailed L. Berdik RN 11:55 07/13/20 ([email or web address]) (NOTE) SARS-CoV-2 target nucleic acids are DETECTED  SARS-CoV-2 RNA is generally detectable in upper respiratory specimens  during the acute phase of infection.  Positive results are indicative  of the presence of the identified virus, but do not rule out bacterial infection or co-infection with other pathogens not detected by the test.  Clinical correlation with patient history and  other diagnostic information is necessary to determine patient infection status.  The expected result is negative.  Fact Sheet for Patients:   StrictlyIdeas.no   Fact Sheet for Healthcare Providers:   BankingDealers.co.za    This test is not yet approved or cleared by the [place] FDA and  has been authorized for detection and/or diagnosis of SARS-CoV-2 by FDA under an Emergency Use Authorization (EUA).  This EUA will remain in effect (meaning this test can be used) for the duration of  the  COVID-19 declaration under Section 564(b)(1) of the Act, 21 U.S.C. section 360-bbb-3(b)(1), unless the authorization is terminated or revoked sooner.  Performed at [facility], [place] [address], [place], [place] [address]   Culture, sputum-assessment     Status: None   Collection Time: 07/14/20 12:05 AM   Specimen:  Sputum  Result Value Ref Range Status   Specimen Description SPUTUM  Final   Special Requests NONE  Final   Sputum evaluation   Final    THIS SPECIMEN IS ACCEPTABLE FOR SPUTUM CULTURE Performed at [facility], [address] [place], [place] [address]    Report Status 07/14/2020 FINAL  Final  Culture, respiratory     Status: None (Preliminary result)   Collection Time: 07/14/20 12:05 AM   Specimen: SPU  Result Value Ref Range Status   Specimen Description SPUTUM  Final   Special Requests NONE Reflexed from [id number]  Final   Gram Stain   Final    RARE WBC PRESENT,BOTH PMN AND MONONUCLEAR MODERATE GRAM POSITIVE COCCI IN PAIRS IN CLUSTERS Performed at [facility], [place] [address], [place], [place] [address]    Culture PENDING  Incomplete   Report Status PENDING  Incomplete    Radiology Reports CT Head Wo Contrast  Result Date: 07/13/2020 CLINICAL DATA:  Mental status change. EXAM: CT HEAD WITHOUT CONTRAST TECHNIQUE: Contiguous axial images were obtained from the base of the skull through the vertex without intravenous contrast. COMPARISON:  08/10/2019 FINDINGS: Brain: No evidence for acute hemorrhage, mass lesion, midline shift, hydrocephalus or large infarct. Small calcifications in the basal ganglia. Vascular: No hyperdense vessel or unexpected calcification. Skull: Normal. Negative for fracture or focal lesion. Sinuses/Orbits: Mucosal thickening in the maxillary sinuses, right side greater than left. Mild mucosal thickening involving the left frontal sinus and ethmoid air cells. Other: None IMPRESSION: 1. No acute intracranial abnormality. 2. Mild paranasal sinus disease. Electronically Signed   By: Scherrie Gerlach.D.  On: 07/13/2020 10:45   DG Chest Portable 1 View  Result Date: 07/13/2020 CLINICAL DATA:  Hypoxia EXAM: PORTABLE CHEST 1 VIEW COMPARISON:  07/13/2020 at 9:51 a.m. FINDINGS: Single frontal view of the chest demonstrates stable dual lead pacemaker. Cardiac  silhouette is unchanged. Since the prior exam, there is progressive central vascular congestion with increased interstitial and ground-glass opacity within the perihilar distribution. Trace right pleural effusion. No pneumothorax. IMPRESSION: 1. Worsening bilateral perihilar interstitial and ground-glass opacities, favor mild edema over progressive pneumonia. Electronically Signed   By: Randa Ngo M.D.   On: 07/13/2020 17:11   DG Chest Port 1 View  Result Date: 07/13/2020 CLINICAL DATA:  Unresponsive and cough. EXAM: PORTABLE CHEST 1 VIEW COMPARISON:  06/25/2020 FINDINGS: Stable appearance of the left dual chamber cardiac pacemaker. Heart size is upper limits of normal but stable. Evidence for a soft tissue Mach line overlying the left upper chest. No evidence to suggest a pneumothorax. No focal airspace disease or lung consolidation. Trachea is deviated towards the right and this is unchanged. IMPRESSION: Slightly improved aeration at the lung bases compared to the prior examination. No acute cardiopulmonary disease. Electronically Signed   By: Markus Daft M.D.   On: 07/13/2020 10:35   DG CHEST PORT 1 VIEW  Result Date: 06/25/2020 CLINICAL DATA:  History of breast cancer. Heart failure, kidney failure. COVID-19 positive EXAM: PORTABLE CHEST 1 VIEW COMPARISON:  06/22/2020 FINDINGS: Decreased lung volume. Progression of mild bibasilar airspace disease which could be atelectasis or pneumonia. No effusion. Dual lead pacemaker unchanged. Chronic rotator cuff impingement/rotator cuff tear. IMPRESSION: Progression of mild bibasilar airspace disease which may be atelectasis or pneumonia. Electronically Signed   By: Franchot Gallo M.D.   On: 06/25/2020 17:05   DG Chest Port 1 View  Result Date: 06/22/2020 CLINICAL DATA:  COVID positive, weakness EXAM: PORTABLE CHEST 1 VIEW COMPARISON:  06/20/2020 FINDINGS: Chronic mild interstitial prominence. No new consolidation. No pleural effusion. Stable cardiomediastinal  contours. Left chest wall pacemaker. IMPRESSION: No significant change since 06/20/2020. Electronically Signed   By: Macy Mis M.D.   On: 06/22/2020 16:29   DG Chest Port 1 View  Result Date: 06/20/2020 CLINICAL DATA:  Cough and congestion EXAM: PORTABLE CHEST 1 VIEW COMPARISON:  08/19/2019 FINDINGS: Left-sided pacing device. Mild cardiomegaly with linear scarring at the left base. No consolidation or effusion. No pneumothorax. IMPRESSION: No active disease. Mild cardiomegaly. Electronically Signed   By: Donavan Foil M.D.   On: 06/20/2020 00:30   ECHOCARDIOGRAM COMPLETE  Result Date: 06/23/2020    ECHOCARDIOGRAM REPORT   Patient Name:   Brook DOTTI BUSEY Date of Exam: 06/23/2020 Medical Rec #:  [id number]    Height:       65.0 in Accession #:    [id number]   Weight:       192.0 lb Date of Birth:  04-20-1933    BSA:          1.944 m Patient Age:    23 years     BP:           97/55 mmHg Patient Gender: F            HR:           64 bpm. Exam Location:  Inpatient Procedure: 2D Echo, Cardiac Doppler and Color Doppler Indications:    R55 Syncope  History:        Patient has prior history of Echocardiogram examinations, most  recent 07/22/2019. CHF, Pacemaker, Stroke, Arrythmias:LBBB; Risk                 Factors:Diabetes. Cancer. CKD. COVID-19 Positive.  Sonographer:    Jonelle Sidle Dance Referring Phys: [place]  1. Left ventricular ejection fraction, by estimation, is 30%. The left ventricle has moderate to severely decreased function. The left ventricle demonstrates global hypokinesis with septal-lateral dyssynchrony. There is severe left ventricular hypertrophy. Left ventricular diastolic parameters are consistent with Grade I diastolic dysfunction (impaired relaxation).  2. Right ventricular systolic function is mildly reduced. The right ventricular size is normal. Tricuspid regurgitation signal is inadequate for assessing PA pressure.  3. Left atrial size was severely  dilated.  4. The mitral valve is normal in structure. Trivial mitral valve regurgitation. No evidence of mitral stenosis.  5. The aortic valve is tricuspid. Aortic valve regurgitation is not visualized. Mild aortic valve sclerosis is present, with no evidence of aortic valve stenosis.  6. Aortic dilatation noted. There is mild dilatation of the aortic root measuring 39 mm.  7. The inferior vena cava is dilated in size with <50% respiratory variability, suggesting right atrial pressure of 15 mmHg. FINDINGS  Left Ventricle: Left ventricular ejection fraction, by estimation, is 30%. The left ventricle has moderate to severely decreased function. The left ventricle demonstrates global hypokinesis. The left ventricular internal cavity size was normal in size. There is severe left ventricular hypertrophy. Left ventricular diastolic parameters are consistent with Grade I diastolic dysfunction (impaired relaxation). Right Ventricle: The right ventricular size is normal. No increase in right ventricular wall thickness. Right ventricular systolic function is mildly reduced. Tricuspid regurgitation signal is inadequate for assessing PA pressure. Left Atrium: Left atrial size was severely dilated. Right Atrium: Right atrial size was normal in size. Pericardium: Trivial pericardial effusion is present. Mitral Valve: The mitral valve is normal in structure. Trivial mitral valve regurgitation. No evidence of mitral valve stenosis. Tricuspid Valve: The tricuspid valve is normal in structure. Tricuspid valve regurgitation is trivial. Aortic Valve: The aortic valve is tricuspid. Aortic valve regurgitation is not visualized. Mild aortic valve sclerosis is present, with no evidence of aortic valve stenosis. Pulmonic Valve: The pulmonic valve was normal in structure. Pulmonic valve regurgitation is not visualized. Aorta: Aortic dilatation noted. There is mild dilatation of the aortic root measuring 39 mm. Venous: The inferior vena cava  is dilated in size with less than 50% respiratory variability, suggesting right atrial pressure of 15 mmHg. IAS/Shunts: No atrial level shunt detected by color flow Doppler.  LEFT VENTRICLE PLAX 2D LVIDd:         5.00 cm  Diastology LVIDs:         3.20 cm  LV e' lateral:   4.35 cm/s LV PW:         1.20 cm  LV E/e' lateral: 11.8 LV IVS:        1.20 cm  LV e' medial:    3.48 cm/s LVOT diam:     2.30 cm  LV E/e' medial:  14.8 LV SV:         74 LV SV Index:   38 LVOT Area:     4.15 cm  RIGHT VENTRICLE            IVC RV Basal diam:  2.70 cm    IVC diam: 2.40 cm RV S prime:     9.36 cm/s TAPSE (M-mode): 1.2 cm LEFT ATRIUM  Index       RIGHT ATRIUM          Index LA diam:        5.40 cm  2.78 cm/m  RA Area:     6.15 cm LA Vol (A2C):   98.9 ml  50.87 ml/m RA Volume:   7.56 ml  3.89 ml/m LA Vol (A4C):   151.0 ml 77.67 ml/m LA Biplane Vol: 125.0 ml 64.29 ml/m  AORTIC VALVE LVOT Vmax:   83.50 cm/s LVOT Vmean:  57.400 cm/s LVOT VTI:    0.177 m  AORTA Ao Root diam: 3.90 cm Ao Asc diam:  3.40 cm MITRAL VALVE MV Area (PHT): 2.69 cm    SHUNTS MV Decel Time: 282 msec    Systemic VTI:  0.18 m MV E velocity: 51.40 cm/s  Systemic Diam: 2.30 cm MV A velocity: 52.80 cm/s MV E/A ratio:  0.97 Loralie Champagne MD Electronically signed by Loralie Champagne MD Signature Date/Time: 06/23/2020/5:51:38 PM    Final

## 2020-07-15 ENCOUNTER — Ambulatory Visit: Payer: Medicare Other

## 2020-07-15 DIAGNOSIS — U071 COVID-19: Secondary | ICD-10-CM

## 2020-07-15 DIAGNOSIS — Z794 Long term (current) use of insulin: Secondary | ICD-10-CM

## 2020-07-15 DIAGNOSIS — R778 Other specified abnormalities of plasma proteins: Secondary | ICD-10-CM

## 2020-07-15 DIAGNOSIS — E1122 Type 2 diabetes mellitus with diabetic chronic kidney disease: Secondary | ICD-10-CM

## 2020-07-15 DIAGNOSIS — I5032 Chronic diastolic (congestive) heart failure: Secondary | ICD-10-CM

## 2020-07-15 DIAGNOSIS — N179 Acute kidney failure, unspecified: Secondary | ICD-10-CM

## 2020-07-15 DIAGNOSIS — Z7189 Other specified counseling: Secondary | ICD-10-CM

## 2020-07-15 DIAGNOSIS — G9341 Metabolic encephalopathy: Secondary | ICD-10-CM

## 2020-07-15 DIAGNOSIS — R5381 Other malaise: Secondary | ICD-10-CM

## 2020-07-15 DIAGNOSIS — Z515 Encounter for palliative care: Secondary | ICD-10-CM

## 2020-07-15 LAB — CBC WITH DIFFERENTIAL/PLATELET
Abs Immature Granulocytes: 0.01 10*3/uL (ref 0.00–0.07)
Basophils Absolute: 0 10*3/uL (ref 0.0–0.1)
Basophils Relative: 0 %
Eosinophils Absolute: 0.4 10*3/uL (ref 0.0–0.5)
Eosinophils Relative: 8 %
HCT: 31.6 % — ABNORMAL LOW (ref 36.0–46.0)
Hemoglobin: 9.8 g/dL — ABNORMAL LOW (ref 12.0–15.0)
Immature Granulocytes: 0 %
Lymphocytes Relative: 14 %
Lymphs Abs: 0.8 10*3/uL (ref 0.7–4.0)
MCH: 30.7 pg (ref 26.0–34.0)
MCHC: 31 g/dL (ref 30.0–36.0)
MCV: 99.1 fL (ref 80.0–100.0)
Monocytes Absolute: 0.4 10*3/uL (ref 0.1–1.0)
Monocytes Relative: 8 %
Neutro Abs: 4 10*3/uL (ref 1.7–7.7)
Neutrophils Relative %: 70 %
Platelets: 142 10*3/uL — ABNORMAL LOW (ref 150–400)
RBC: 3.19 MIL/uL — ABNORMAL LOW (ref 3.87–5.11)
RDW: 14.6 % (ref 11.5–15.5)
WBC: 5.7 10*3/uL (ref 4.0–10.5)
nRBC: 0 % (ref 0.0–0.2)

## 2020-07-15 LAB — COMPREHENSIVE METABOLIC PANEL
ALT: 31 U/L (ref 0–44)
AST: 36 U/L (ref 15–41)
Albumin: 2.4 g/dL — ABNORMAL LOW (ref 3.5–5.0)
Alkaline Phosphatase: 64 U/L (ref 38–126)
Anion gap: 5 (ref 5–15)
BUN: 13 mg/dL (ref 8–23)
CO2: 24 mmol/L (ref 22–32)
Calcium: 8.5 mg/dL — ABNORMAL LOW (ref 8.9–10.3)
Chloride: 111 mmol/L (ref 98–111)
Creatinine, Ser: 1.22 mg/dL — ABNORMAL HIGH (ref 0.44–1.00)
GFR calc Af Amer: 47 mL/min — ABNORMAL LOW (ref >60)
GFR calc non Af Amer: 40 mL/min — ABNORMAL LOW (ref >60)
Glucose, Bld: 274 mg/dL — ABNORMAL HIGH (ref 70–99)
Potassium: 4.7 mmol/L (ref 3.5–5.1)
Sodium: 140 mmol/L (ref 135–145)
Total Bilirubin: 0.6 mg/dL (ref 0.3–1.2)
Total Protein: 6.2 g/dL — ABNORMAL LOW (ref 6.5–8.1)

## 2020-07-15 LAB — MAGNESIUM: Magnesium: 2.2 mg/dL (ref 1.7–2.4)

## 2020-07-15 LAB — GLUCOSE, CAPILLARY
Glucose-Capillary: 196 mg/dL — ABNORMAL HIGH (ref 70–99)
Glucose-Capillary: 173 mg/dL — ABNORMAL HIGH (ref 70–99)
Glucose-Capillary: 243 mg/dL — ABNORMAL HIGH (ref 70–99)
Glucose-Capillary: 268 mg/dL — ABNORMAL HIGH (ref 70–99)

## 2020-07-15 LAB — BRAIN NATRIURETIC PEPTIDE: B Natriuretic Peptide: 891.4 pg/mL — ABNORMAL HIGH (ref 0.0–100.0)

## 2020-07-15 LAB — C-REACTIVE PROTEIN: CRP: 10.8 mg/dL — ABNORMAL HIGH (ref <1.0)

## 2020-07-15 LAB — LEGIONELLA PNEUMOPHILA SEROGP 1 UR AG: L. pneumophila Serogp 1 Ur Ag: NEGATIVE

## 2020-07-15 MED ORDER — METOPROLOL SUCCINATE ER 25 MG PO TB24
25.0000 mg | ORAL_TABLET | Freq: Every day | ORAL | Status: DC
  Administered 2020-07-15 – 2020-07-20 (×6): 25 mg via ORAL
  Filled 2020-07-15 (×6): qty 1

## 2020-07-15 MED ORDER — INFLUENZA VAC A&B SA ADJ QUAD 0.5 ML IM PRSY
0.5000 mL | PREFILLED_SYRINGE | INTRAMUSCULAR | Status: AC
  Administered 2020-07-16: 0.5 mL via INTRAMUSCULAR
  Filled 2020-07-15: qty 0.5

## 2020-07-15 MED ORDER — RESOURCE THICKENUP CLEAR PO POWD
ORAL | Status: DC | PRN
  Filled 2020-07-15: qty 125

## 2020-07-15 MED ORDER — IPRATROPIUM-ALBUTEROL 0.5-2.5 (3) MG/3ML IN SOLN: 3.0000 mL | Freq: Four times a day (QID) | RESPIRATORY_TRACT | Status: DC | PRN

## 2020-07-15 MED ORDER — GLUCERNA SHAKE PO LIQD
237.0000 mL | Freq: Two times a day (BID) | ORAL | Status: DC
  Administered 2020-07-15 – 2020-07-19 (×4): 237 mL via ORAL

## 2020-07-15 MED ORDER — INSULIN ASPART 100 UNIT/ML ~~LOC~~ SOLN
2.0000 [IU] | Freq: Once | SUBCUTANEOUS | Status: AC
  Administered 2020-07-15: 2 [IU] via SUBCUTANEOUS

## 2020-07-15 NOTE — Consult Note (Signed)
Consultation Note Date: 07/15/2020   Patient Name: [name]  DOB: 03-30-1933  MRN: [id number]  Age / Sex: 86 y.o., female  PCP: [name], MD Referring Physician: Aline August, MD  Reason for Consultation: Establishing goals of care  HPI/Patient Profile: 86 y.o. female  with past medical history of diabetes, breast cancer on anastrozole, SVT s/p ablation with permanent pacemaker, combined heart failure EF 30%, CKD stage 3b, stroke, recurrent DVT on Eliquis, recent COVID-19 pneumonia admitted on 07/13/2020 with hypoglycemia associated with poor intake (also on insulin) and brief period of unresponsiveness. CT head negative for acute changes. Concern for aspiration pneumonia. Wheel-chair bound at baseline.   Clinical Assessment and Goals of Care: I met today at [name]'s bedside but have recently missed family that was visiting with her. [name] is in good spirits and sitting up in recliner and feeding herself lunch. She eats all her pudding and finishing her mashed potatoes and working down her egg salad. We discussed adding supplement such as Ensure and she agrees. Overall she is feeling well with no real complains (other than her food) and is hopeful to continue with her rehab stay and ultimately to return to her home where she lives with her daughter. Prior to her recent COVID infection and pneumonia she was living at home, wheelchair bound for past ~1 year, but able to transport and dress, bathe, toilet herself independently.   [name] mentions to me that she was concerned when she had COVID that she was "on her way out of here" but laughs and says she is still kicking as she has to stay and keep her family straight! [name] appears to have a very supportive family. We did briefly discuss if during all these past illnesses and hospitalizations if any conversation came up about her wishes. She shares  with me that she has wanted to complete a Living Will while she still can and share her wishes with her family. I offered to help arrange a meeting with her family to discuss and complete if she wishes and she agrees this would be helpful. She does share that she does believe that she has been through enough and when her time comes she wishes to be comfortable and would not desire aggressive measures to prolong her life. We agree that we can discuss this further with her family.   I called and spoke with daughter and granddaughter, [name] and [name], and we discussed her desire to meet together to discuss advance directives and complete Living Will. They agree to meet with me tomorrow 9/23 1500 to discuss further. They did voice concern regarding conversations previously about potential poor prognosis. I did explain that [name] has some very serious barriers to improvement but she is currently in good spirits, eating/drinking, and motivated to continue with therapy. I also explained that now is the perfect time to plan for the future and have these discussions as none of [place] know what the future will bring and we need to know more  about what [name] wishes when faced with these difficult decisions. They agree.   All questions/concerns addressed. Emotional support provided.   Primary Decision Maker PATIENT    SUMMARY OF RECOMMENDATIONS   - Family meeting tomorrow 9/23 1500  Code Status/Advance Care Planning:  Full code -  To discuss further tomorrow   Symptom Management:   Per attending.   Added Glucerna supplementation.   Palliative Prophylaxis:   Aspiration, Bowel Regimen, Delirium Protocol, Frequent Pain Assessment and Turn Reposition  Psycho-social/Spiritual:   Desire for further Chaplaincy support:yes  Additional Recommendations: Caregiving  Support/Resources  Prognosis:   Barriers to significant improvement due to severe deconditioning.   Discharge Planning:  [place] for rehab with Palliative care service follow-up      Primary Diagnoses: Present on Admission: . Aspiration pneumonia ([place]) . Acute on chronic diastolic CHF (congestive heart failure) ([place]) . CKD (chronic kidney disease), stage III ([place]) . HTN (hypertension) . Hypokalemia . LBBB (left bundle branch block) . Hypoglycemia . Elevated troponin . Sepsis ([place])   I have reviewed the medical record, interviewed the patient and family, and examined the patient. The following aspects are pertinent.  Past Medical History:  Diagnosis Date  . Abnormal echocardiogram    a. possible mass on echo 05/2019 on PPM, b. no evidence of mass / atrial lead vegetation on follow up echo 06/2019  . Anemia   . Arthritis   . Breast cancer ([place]) 10/2018   Invasive ductal carcinoma  . Cancer ([place])   . Cataract   . Chronic combined systolic and diastolic CHF (congestive heart failure) ([place])    a. Previously diastolic but patient AVS from outside hospital listed systolic CHF and cardiomyopathy, records pending.  . CKD (chronic kidney disease), stage III   . Clotting disorder ([place])   . CVA (cerebral vascular accident) ([place])    residual right sided weakness and mild dysphagia  . Diabetes mellitus ([place])   . Dizziness and giddiness   . DVT (deep venous thrombosis) ([facility])    a. on anticoagulation for this.  . Essential hypertension   . LBBB (left bundle branch block)   . Mitral regurgitation    a. Mod MR by echo 2014.  . Muscular deconditioning   . Obesity   . Pacemaker   . Pericardial effusion   . SVT (supraventricular tachycardia) ([place])    a. In 2013 she had an EPS with ablation for SVT which did not eliminate the SVT completely. She also had bradycardia which limited medication. She was placed on amiodarone by Dr. Lovena Le.  . Thrombocytopenia ([place]) 11/21/2011   Social History   Socioeconomic History  . Marital status: Widowed    Spouse name: Not on file  . Number of children:  Not on file  . Years of education: Not on file  . Highest education level: Not on file  Occupational History  . Occupation: retired  Tobacco Use  . Smoking status: Never Smoker  . Smokeless tobacco: Never Used  Vaping Use  . Vaping Use: Never used  Substance and Sexual Activity  . Alcohol use: No  . Drug use: No  . Sexual activity: Not Currently  Other Topics Concern  . Not on file  Social History Narrative   Lives at home with grandchild, daughter comes and help    Social Determinants of Health   Financial Resource Strain: Low Risk   . Difficulty of Paying Living Expenses: Not hard at all  Food Insecurity: No Food Insecurity  .  Worried About Charity fundraiser in the Last Year: Never true  . Ran Out of Food in the Last Year: Never true  Transportation Needs: No Transportation Needs  . Lack of Transportation (Medical): No  . Lack of Transportation (Non-Medical): No  Physical Activity: Inactive  . Days of Exercise per Week: 0 days  . Minutes of Exercise per Session: 0 min  Stress: No Stress Concern Present  . Feeling of Stress : Only a little  Social Connections:   . Frequency of Communication with Friends and Family: Not on file  . Frequency of Social Gatherings with Friends and Family: Not on file  . Attends Religious Services: Not on file  . Active Member of Clubs or Organizations: Not on file  . Attends Archivist Meetings: Not on file  . Marital Status: Not on file   Family History  Problem Relation Age of Onset  . Stroke Mother   . Cancer Father        prostate  . Hypertension Daughter   . Heart attack Brother        x2  . Hypertension Brother   . Stroke Sister   . Hypertension Sister   . Breast cancer Maternal Aunt    Scheduled Meds: . amiodarone  200 mg Oral Daily  . apixaban  2.5 mg Oral BID  . atorvastatin  20 mg Oral QHS  . cholecalciferol  1,000 Units Oral Daily  . ferrous sulfate  325 mg Oral Q breakfast  . insulin aspart  0-9 Units  Subcutaneous TID WC  . metoprolol succinate  25 mg Oral Daily  . pantoprazole  40 mg Oral Daily  . pregabalin  75 mg Oral QHS  . vitamin B-12  1,000 mcg Oral Daily   Continuous Infusions: . ampicillin-sulbactam (UNASYN) IV 3 g (07/15/20 1051)   PRN Meds:.acetaminophen **OR** acetaminophen, [DISCONTINUED] ondansetron **OR** ondansetron (ZOFRAN) IV, Resource ThickenUp Clear Allergies  Allergen Reactions  . Aspirin Itching  . Penicillins Itching and Rash    DID THE REACTION INVOLVE: Swelling of the face/tongue/throat, SOB, or low BP? Yes Sudden or severe rash/hives, skin peeling, or the inside of the mouth or nose? No Did it require medical treatment? Yes When did it last happen?"More than 10 years ago" If all above answers are "NO", may proceed with cephalosporin use.    Review of Systems  Constitutional: Positive for activity change, appetite change and fatigue.  Respiratory: Negative for cough and shortness of breath.   Neurological: Positive for weakness.    Physical Exam Vitals and nursing note reviewed.  Constitutional:      General: She is not in acute distress.    Appearance: She is ill-appearing.  Cardiovascular:     Rate and Rhythm: Normal rate.  Pulmonary:     Effort: Pulmonary effort is normal. No tachypnea, accessory muscle usage or respiratory distress.     Comments: Room air  Abdominal:     Palpations: Abdomen is soft.  Neurological:     Mental Status: She is alert and oriented to person, place, and time.     Vital Signs: BP 133/63   Pulse 71   Temp 98.8 F (37.1 C) (Oral)   Resp 20   Ht '5\' 5"'  (1.651 m)   Wt 87 kg   SpO2 95%   BMI 31.92 kg/m  Pain Scale: 0-10   Pain Score: 0-No pain   SpO2: SpO2: 95 % O2 Device:SpO2: 95 % O2 Flow Rate: .O2 Flow Rate (L/min):  1 L/min  IO: Intake/output summary:   Intake/Output Summary (Last 24 hours) at 07/15/2020 1246 Last data filed at 07/15/2020 1051 Gross per 24 hour  Intake 927.13 ml  Output 900 ml   Net 27.13 ml    LBM: Last BM Date: 07/14/20 Baseline Weight: Weight: 87 kg Most recent weight: Weight: 87 kg     Palliative Assessment/Data:     Time In: 1300 Time Out: 1410 Time Total: 70 MIN Greater than 50%  of this time was spent counseling and coordinating care related to the above assessment and plan.  Signed by: Vinie Sill, NP Palliative Medicine Team Pager # [phone] (M-F 8a-5p) Team Phone # [phone] (Nights/Weekends)

## 2020-07-15 NOTE — Progress Notes (Signed)
Progress Note  Patient Name: [name] Date of Encounter: 07/15/2020  [facility] Cardiologist: Larae Grooms, MD   Subjective   No dyspnea; CP with cough  Inpatient Medications    Scheduled Meds: . amiodarone  200 mg Oral Daily  . apixaban  2.5 mg Oral BID  . atorvastatin  20 mg Oral QHS  . cholecalciferol  1,000 Units Oral Daily  . ferrous sulfate  325 mg Oral Q breakfast  . insulin aspart  0-9 Units Subcutaneous TID WC  . metoprolol tartrate  25 mg Oral BID  . pantoprazole  40 mg Oral Daily  . pregabalin  75 mg Oral QHS  . vitamin B-12  1,000 mcg Oral Daily   Continuous Infusions: . ampicillin-sulbactam (UNASYN) IV 3 g (07/14/20 2336)  . lactated ringers 75 mL/hr at 07/14/20 1138   PRN Meds: acetaminophen **OR** acetaminophen, [DISCONTINUED] ondansetron **OR** ondansetron (ZOFRAN) IV   Vital Signs    Vitals:   07/14/20 2106 07/14/20 2245 07/15/20 0438 07/15/20 0440  BP:  113/67  121/66  Pulse: 81 79    Resp: 20 (!) 21    Temp:   99 F (37.2 C) 99 F (37.2 C)  TempSrc:   Oral Oral  SpO2: 98% 96%    Weight:      Height:        Intake/Output Summary (Last 24 hours) at 07/15/2020 0743 Last data filed at 07/15/2020 0300 Gross per 24 hour  Intake 927.13 ml  Output 1000 ml  Net -72.87 ml   Last 3 Weights 07/13/2020 06/30/2020 06/29/2020  Weight (lbs) 191 lb 12.8 oz 192 lb 0.3 oz 191 lb 13.1 oz  Weight (kg) 87 kg 87.1 kg 87.008 kg      Telemetry    Vpaced - Personally Reviewed  Physical Exam   GEN: No acute distress.   Neck: No JVD Cardiac: RRR, no murmurs, rubs, or gallops.  Respiratory: Diminished BS bases GI: Soft, nontender, non-distended  MS: Trace edema Neuro:  Nonfocal  Psych: Normal affect   Labs    High Sensitivity Troponin:   Recent Labs  Lab 06/22/20 1735 07/13/20 0934 07/13/20 1213 07/13/20 1836 07/14/20 0020  TROPONINIHS 70* 41* 52* 466* 454*      Chemistry Recent Labs  Lab 07/13/20 0934 07/13/20 0934  07/13/20 0943 07/14/20 0020 07/14/20 0900  NA 144   < > 144 141 143  K 3.1*   < > 3.2* 4.1 4.3  CL 104  --   --  106 106  CO2 29  --   --  25 26  GLUCOSE 71  --   --  215* 171*  BUN 19  --   --  19 17  CREATININE 1.71*  --   --  1.67* 1.61*  CALCIUM 8.3*  --   --  7.9* 8.3*  PROT 6.5  --   --  5.7* 6.6  ALBUMIN 2.8*  --   --  2.3* 2.6*  AST 27  --   --  37 42*  ALT 22  --   --  27 32  ALKPHOS 62  --   --  59 65  BILITOT 0.5  --   --  0.5 0.7  GFRNONAA 27*  --   --  28* 29*  GFRAA 31*  --   --  32* 33*  ANIONGAP 11  --   --  10 11   < > = values in this interval not displayed.  Hematology Recent Labs  Lab 07/13/20 0934 07/13/20 0943 07/14/20 0900  WBC 4.2  --  7.7  RBC 3.29*  --  3.39*  3.45*  HGB 9.9* 9.9* 10.2*  HCT 32.2* 29.0* 34.3*  MCV 97.9  --  101.2*  MCH 30.1  --  30.1  MCHC 30.7  --  29.7*  RDW 14.6  --  14.8  PLT 111*  --  116*    BNP Recent Labs  Lab 07/13/20 1850 07/14/20 0900  BNP 678.9* 1,163.9*     Radiology    CT Head Wo Contrast  Result Date: 07/13/2020 CLINICAL DATA:  Mental status change. EXAM: CT HEAD WITHOUT CONTRAST TECHNIQUE: Contiguous axial images were obtained from the base of the skull through the vertex without intravenous contrast. COMPARISON:  08/10/2019 FINDINGS: Brain: No evidence for acute hemorrhage, mass lesion, midline shift, hydrocephalus or large infarct. Small calcifications in the basal ganglia. Vascular: No hyperdense vessel or unexpected calcification. Skull: Normal. Negative for fracture or focal lesion. Sinuses/Orbits: Mucosal thickening in the maxillary sinuses, right side greater than left. Mild mucosal thickening involving the left frontal sinus and ethmoid air cells. Other: None IMPRESSION: 1. No acute intracranial abnormality. 2. Mild paranasal sinus disease. Electronically Signed   By: Markus Daft M.D.   On: 07/13/2020 10:45   DG Chest Portable 1 View  Result Date: 07/13/2020 CLINICAL DATA:  Hypoxia EXAM:  PORTABLE CHEST 1 VIEW COMPARISON:  07/13/2020 at 9:51 a.m. FINDINGS: Single frontal view of the chest demonstrates stable dual lead pacemaker. Cardiac silhouette is unchanged. Since the prior exam, there is progressive central vascular congestion with increased interstitial and ground-glass opacity within the perihilar distribution. Trace right pleural effusion. No pneumothorax. IMPRESSION: 1. Worsening bilateral perihilar interstitial and ground-glass opacities, favor mild edema over progressive pneumonia. Electronically Signed   By: Randa Ngo M.D.   On: 07/13/2020 17:11   DG Chest Port 1 View  Result Date: 07/13/2020 CLINICAL DATA:  Unresponsive and cough. EXAM: PORTABLE CHEST 1 VIEW COMPARISON:  06/25/2020 FINDINGS: Stable appearance of the left dual chamber cardiac pacemaker. Heart size is upper limits of normal but stable. Evidence for a soft tissue Mach line overlying the left upper chest. No evidence to suggest a pneumothorax. No focal airspace disease or lung consolidation. Trachea is deviated towards the right and this is unchanged. IMPRESSION: Slightly improved aeration at the lung bases compared to the prior examination. No acute cardiopulmonary disease. Electronically Signed   By: Markus Daft M.D.   On: 07/13/2020 10:35    Patient Profile     86 y.o. female with past medical history of hypertension, SVT on amiodarone, chronic stage III kidney disease, chronic systolic congestive heart failure, recent Covid infection admitted with hypoglycemia and probable aspiration pneumonia for evaluation of elevated troponin.  Echocardiogram August 31 showed ejection fraction 85%, grade 1 diastolic dysfunction, severe left atrial enlargement.  Assessment & Plan    1 elevated troponin-likely demand ischemia in the setting of aspiration pneumonia.  She is not having chest pain other than with cough.  Patient will likely have Tamora nuclear study when she recovers from pneumonia as an outpatient.  No  plans for further inpatient ischemia evaluation.  2 cardiomyopathy-LV function newly reduced in the setting of recent Covid infection.  Plan Lexiscan nuclear study following discharge.  Blood pressure has been borderline.  We will change metoprolol to Toprol 25 mg daily.  Add ARB later as blood pressure allows.  Titrate medications as an outpatient.  3 aspiration  pneumonia-continue antibiotics per primary care.  4 prior pacemaker-she will follow up with Dr. Lovena Le following discharge.  5 history of supraventricular tachycardia-patient on chronic amiodarone.  We will continue.  6 history of DVT on chronic anticoagulation-continue apixaban.  For questions or updates, please contact [place] Please consult [email or web address] for contact info under        Signed, Kirk Ruths, MD  07/15/2020, 7:43 AM

## 2020-07-15 NOTE — Evaluation (Signed)
Physical Therapy Evaluation Patient Details Name: [name] MRN: [id number] DOB: Feb 25, 1933 Today's Date: 07/15/2020   History of Present Illness  86 year old female with history of diabetes mellitus type 2, breast cancer on anastrozole, SVT status post ablation with permanent pacemaker, chronic combined heart failure, chronic kidney disease stage IIIb, unspecified stroke, recurrent DVT on Eliquis, recentCOVID-19 pneumonia and discharged to a nursing home who presented with very low blood sugars at nursing home along with poor appetite and brief.  Of unresponsiveness.  On presentation, CT of the head was negative for any acute abnormality.  She was noted to be slightly hypoxic with mild fever and there was a suspicion for aspiration pneumonia for which she was started on antibiotics.  Clinical Impression  Pt fully participated in evaluation; pt states she would like to go home, but daughter will need surgery soon on her shoulder; pt performs functional mobility tasks from w/c level but is mod I with transfers at SNF; pt on room air, stating RN took her off this AM, pt is limited in activity tolerance due to RLE pain, generalized weakness, poor coordination and poor overall endurance; pt will benefit from skilled PT to progress deficits to maximize independence with functional mobility prior to discharge. Pt will discharge home with HHPT or SNF pending progress with therapist, follow therapy is recommended    Follow Up Recommendations Home health PT;SNF;Other (comment) (pending progress with PT)    Equipment Recommendations  Other (comment) (TBD with progress; pt states she has all equipment needed, will follow up regarding w/c to ensure proper size and cushion)    Recommendations for Other Services       Precautions / Restrictions Precautions Precautions: Fall;Other (comment) (aspiration) Precaution Comments: diarrhea Restrictions Weight Bearing Restrictions: No      Mobility  Bed  Mobility Overal bed mobility: Needs Assistance Bed Mobility: Supine to Sit Rolling: Modified independent (Device/Increase time) (with use of bed rail)   Supine to sit: HOB elevated;Min assist        Transfers Overall transfer level: Needs assistance Equipment used: Rolling walker (2 wheeled) Transfers: Sit to/from Stand Sit to Stand: Min assist;Mod assist Stand pivot transfers: Min assist       General transfer comment: pt requiring mod A to stand with RW from EOB, pt pulling up on RW even wtih cueing to push up from bed; pt min A stand step with RW  Ambulation/Gait                Stairs            Wheelchair Mobility    Modified Rankin (Stroke Patients Only)       Balance Overall balance assessment: Needs assistance Sitting-balance support: No upper extremity supported;Feet supported Sitting balance-Leahy Scale: Good     Standing balance support: During functional activity;Bilateral upper extremity supported Standing balance-Leahy Scale: Fair Standing balance comment: pt with increased weight bearing through B UEs on RW during static and dynamic standing                             Pertinent Vitals/Pain Pain Assessment: Faces Pain Score: 8  Faces Pain Scale: Hurts little more Pain Location: hurts only when she coughs Pain Descriptors / Indicators: Discomfort;Sore Pain Intervention(s): Limited activity within patient's tolerance;Monitored during session;Repositioned    Home Living Family/patient expects to be discharged to:: Private residence Living Arrangements: Children;Other relatives Available Help at Discharge: Family;Available 24 hours/day Type of Home:  Apartment Home Access: Level entry     Home Layout: One level Home Equipment: Tub bench;Grab bars - toilet;Grab bars - tub/shower;Hand held shower head;Walker - 4 wheels;Walker - 2 wheels;Wheelchair - manual;Cane - single point Additional Comments: pt states she uses w/c for  mobility; pt states she is mod I with transfers; states at SNF working on ambulation    Prior Function Level of Independence: Needs assistance   Gait / Transfers Assistance Needed: pt states she has been using a w/c since ankle fx; pt requiring assist for tub transfer and set up for ADLs  ADL's / Homemaking Assistance Needed: Reports being able to perform ADLs from set up seated level. Supervision for tub transfers.  Comments: pt came from SNF where she was recieving PT, OT and SLP     Hand Dominance   Dominant Hand: Right    Extremity/Trunk Assessment   Upper Extremity Assessment Upper Extremity Assessment: Defer to OT evaluation    Lower Extremity Assessment Lower Extremity Assessment: Generalized weakness RLE Deficits / Details: BLE  grossly 3-/5    Cervical / Trunk Assessment Cervical / Trunk Assessment: Kyphotic  Communication   Communication: No difficulties  Cognition Arousal/Alertness: Awake/alert Behavior During Therapy: WFL for tasks assessed/performed Overall Cognitive Status: Within Functional Limits for tasks assessed                                        General Comments      Exercises     Assessment/Plan    PT Assessment Patient needs continued PT services  PT Problem List Decreased strength;Decreased mobility;Decreased range of motion;Decreased activity tolerance;Decreased balance;Decreased knowledge of use of DME       PT Treatment Interventions Therapeutic exercise;Functional mobility training;Therapeutic activities;Patient/family education;Wheelchair mobility training    PT Goals (Current goals can be found in the Care Plan section)  Acute Rehab PT Goals Patient Stated Goal: to go home PT Goal Formulation: With patient Time For Goal Achievement: 07/29/20 Potential to Achieve Goals: Fair    Frequency Min 2X/week   Barriers to discharge        Co-evaluation               AM-PAC PT "6 Clicks" Mobility  Outcome  Measure Help needed turning from your back to your side while in a flat bed without using bedrails?: A Little Help needed moving from lying on your back to sitting on the side of a flat bed without using bedrails?: A Little Help needed moving to and from a bed to a chair (including a wheelchair)?: A Little Help needed standing up from a chair using your arms (e.g., wheelchair or bedside chair)?: A Lot Help needed to walk in hospital room?: Total Help needed climbing 3-5 steps with a railing? : Total 6 Click Score: 13    End of Session Equipment Utilized During Treatment: Gait belt Activity Tolerance: Patient tolerated treatment well Patient left: with call bell/phone within reach;with chair alarm set;in chair Nurse Communication: Mobility status PT Visit Diagnosis: Unsteadiness on feet (R26.81);Other abnormalities of gait and mobility (R26.89);Muscle weakness (generalized) (M62.81)    Time: 6269-4854 PT Time Calculation (min) (ACUTE ONLY): 23 min   Charges:   PT Evaluation $PT Eval Low Complexity: 1 Low PT Treatments $Therapeutic Activity: 8-22 mins        Lyanne Co, DPT Acute Rehabilitation Services [phone]  [name] 07/15/2020, 11:16 AM

## 2020-07-15 NOTE — Progress Notes (Addendum)
Patient ID: [name], female   DOB: [birth year], 86 y.o.   MRN: [id number]  PROGRESS NOTE    [name]  ZGY:[id number] DOB: 25-Apr-1933 DOA: 07/13/2020 PCP: [name], MD   Brief Narrative:  86 year old female with history of diabetes mellitus type 2, breast cancer on anastrozole, SVT status post ablation with permanent pacemaker, chronic combined heart failure, chronic kidney disease stage IIIb, unspecified stroke, recurrent DVT on Eliquis, recent COVID-19 pneumonia and discharged to a nursing home presented with very low blood sugars at nursing home along with poor appetite and brief.  Of unresponsiveness.  On presentation, CT of the head was negative for any acute abnormality.  She was noted to be slightly hypoxic with mild fever and there was a suspicion for aspiration pneumonia for which she was started on antibiotics.   Assessment & Plan:   Possible aspiration pneumonia hypoxia -Currently on Unasyn and requiring 1 L nasal cannula oxygen.  Wean off as able. -Diet as per SLP recommendations.  Cultures negative so far  Acute metabolic encephalopathy -Probably from pneumonia and hypoglycemia. -Mental status improving.  Continue neurochecks.  Fall precautions.  PT eval.  Recent fully treated COVID-19 pneumonia -Patient had a recent hospitalization and was treated for COVID-19 pneumonia. -Incentive spirometry.  Hypertension -Blood pressure stable.  Continue beta-blocker.  DC IV fluids  Diabetes mellitus type 2 uncontrolled with hypoglycemia/hyperglycemia -Presented with hypoglycemia and altered mental status probably from poor oral intake.  A1c 9.0.  Blood sugars are trending upwards while hospitalized.  Continue CBGs with SSI.  Dehydration -Improving.  DC IV fluids.  AKI on CKD stage IIIb -Baseline creatinine 1.5.  Creatinine 1.61 on 07/14/2020.  Labs pending for today.  Chronic combined CHF -EF of 30% on most recent echocardiogram.  Lasix on hold.  Strict input output.   Daily weights.  No signs of fluid overload  History of DVT -Continue Eliquis  Prior unspecified CVA -Continue statin and Eliquis  History of SVT/pacemaker placement -On amiodarone as an outpatient for possible?  SVT.  Continue amiodarone.  Outpatient follow-up with cardiology  Chronic weakness and deconditioning -Wheelchair-bound at baseline.  Currently from SNF.  Consult Education officer, museum for placement back to SNF.  PT eval. -Palliative care evaluation for goals of care discussion   DVT prophylaxis: Eliquis Code Status: Full Family Communication: Spoke to daughter & granddaughter on phone on 07/15/2020 disposition Plan: Status is: Inpatient  Remains inpatient appropriate because:Inpatient level of care appropriate due to severity of illness   Dispo: The patient is from: SNF              Anticipated d/c is to: SNF              Anticipated d/c date is: 2 days              Patient currently is not medically stable to d/c.   Consultants: None Procedures: None  Antimicrobials:  Anti-infectives (From admission, onward)   Start     Dose/Rate Route Frequency Ordered Stop   07/14/20 1900  cefTRIAXone (ROCEPHIN) 1 g in sodium chloride 0.9 % 100 mL IVPB  Status:  Discontinued        1 g 200 mL/hr over 30 Minutes Intravenous Every 24 hours 07/13/20 2002 07/14/20 0924   07/14/20 1800  azithromycin (ZITHROMAX) 500 mg in sodium chloride 0.9 % 250 mL IVPB  Status:  Discontinued        500 mg 250 mL/hr over 60 Minutes Intravenous Every 24 hours 07/13/20  2010 07/14/20 0924   07/14/20 1000  Ampicillin-Sulbactam (UNASYN) 3 g in sodium chloride 0.9 % 100 mL IVPB        3 g 200 mL/hr over 30 Minutes Intravenous Every 12 hours 07/14/20 0928     07/13/20 1815  cefTRIAXone (ROCEPHIN) 1 g in sodium chloride 0.9 % 100 mL IVPB        1 g 200 mL/hr over 30 Minutes Intravenous  Once 07/13/20 1803 07/13/20 2003   07/13/20 1815  azithromycin (ZITHROMAX) 500 mg in sodium chloride 0.9 % 250 mL IVPB         500 mg 250 mL/hr over 60 Minutes Intravenous  Once 07/13/20 1803 07/13/20 2114       Subjective: Patient seen and examined at bedside.  Poor historian.  Complains of intermittent cough and weakness but feels slightly better.  No overnight fever or vomiting reported.  Objective: Vitals:   07/14/20 2245 07/15/20 0438 07/15/20 0440 07/15/20 0853  BP: 113/67  121/66 133/63  Pulse: 79   75  Resp: (!) 21   20  Temp:  99 F (37.2 C) 99 F (37.2 C) 98.8 F (37.1 C)  TempSrc:  Oral Oral Oral  SpO2: 96%   99%  Weight:      Height:        Intake/Output Summary (Last 24 hours) at 07/15/2020 0928 Last data filed at 07/15/2020 0300 Gross per 24 hour  Intake 927.13 ml  Output 1000 ml  Net -72.87 ml   Filed Weights   07/13/20 0942  Weight: 87 kg    Examination:  General exam: Appears calm and comfortable.  Currently on 1 L oxygen via nasal cannula.  Chronically ill looking female lying on bed. Respiratory system: Bilateral decreased breath sounds at bases with scattered crackles Cardiovascular system: S1 & S2 heard, Rate controlled Gastrointestinal system: Abdomen is nondistended, soft and nontender. Normal bowel sounds heard. Extremities: No cyanosis, clubbing; trace lower extremity edema present Central nervous system: Awake and alert.  Poor historian.  Slow to respond.  No focal neurological deficits. Moving extremities Skin: No rashes, lesions or ulcers Psychiatry: Flat affect    Data Reviewed: I have personally reviewed following labs and imaging studies  CBC: Recent Labs  Lab 07/13/20 0934 07/13/20 0943 07/14/20 0900  WBC 4.2  --  7.7  NEUTROABS 2.8  --  5.7  HGB 9.9* 9.9* 10.2*  HCT 32.2* 29.0* 34.3*  MCV 97.9  --  101.2*  PLT 111*  --  101*   Basic Metabolic Panel: Recent Labs  Lab 07/13/20 0934 07/13/20 0943 07/14/20 0020 07/14/20 0900  NA 144 144 141 143  K 3.1* 3.2* 4.1 4.3  CL 104  --  106 106  CO2 29  --  25 26  GLUCOSE 71  --  215* 171*  BUN  19  --  19 17  CREATININE 1.71*  --  1.67* 1.61*  CALCIUM 8.3*  --  7.9* 8.3*  MG  --   --   --  2.2   GFR: Estimated Creatinine Clearance: 27.8 mL/min (A) (by C-G formula based on SCr of 1.61 mg/dL (H)). Liver Function Tests: Recent Labs  Lab 07/13/20 0934 07/14/20 0020 07/14/20 0900  AST 27 37 42*  ALT 22 27 32  ALKPHOS 62 59 65  BILITOT 0.5 0.5 0.7  PROT 6.5 5.7* 6.6  ALBUMIN 2.8* 2.3* 2.6*   No results for input(s): LIPASE, AMYLASE in the last 168 hours. No results for input(s): AMMONIA  in the last 168 hours. Coagulation Profile: Recent Labs  Lab 07/13/20 0934  INR 1.1   Cardiac Enzymes: No results for input(s): CKTOTAL, CKMB, CKMBINDEX, TROPONINI in the last 168 hours. BNP (last 3 results) No results for input(s): PROBNP in the last 8760 hours. HbA1C: Recent Labs    07/14/20 0900  HGBA1C 9.0*   CBG: Recent Labs  Lab 07/14/20 0017 07/14/20 0826 07/14/20 1131 07/14/20 1731 07/15/20 0845  GLUCAP 196* 130* 217* 213* 196*   Lipid Profile: No results for input(s): CHOL, HDL, LDLCALC, TRIG, CHOLHDL, LDLDIRECT in the last 72 hours. Thyroid Function Tests: Recent Labs    07/14/20 0900  TSH 1.099   Anemia Panel: Recent Labs    07/14/20 0900  VITAMINB12 3,092*  FOLATE 19.4  FERRITIN 208  TIBC 262  IRON 20*  RETICCTPCT 2.7   Sepsis Labs: Recent Labs  Lab 07/13/20 1213 07/13/20 1850 07/13/20 2116 07/13/20 2316 07/14/20 0900  PROCALCITON  --   --   --   --  8.15  LATICACIDVEN 1.1 1.7 0.5 1.9  --     Recent Results (from the past 240 hour(s))  SARS Coronavirus 2 by RT PCR (hospital order, performed in [facility] hospital lab) Nasopharyngeal Nasopharyngeal Swab     Status: Abnormal   Collection Time: 07/13/20  9:40 AM   Specimen: Nasopharyngeal Swab  Result Value Ref Range Status   SARS Coronavirus 2 POSITIVE (A) NEGATIVE Final    Comment: emailed L. Berdik RN 11:55 07/13/20 ([email or web address]) (NOTE) SARS-CoV-2 target nucleic acids are  DETECTED  SARS-CoV-2 RNA is generally detectable in upper respiratory specimens  during the acute phase of infection.  Positive results are indicative  of the presence of the identified virus, but do not rule out bacterial infection or co-infection with other pathogens not detected by the test.  Clinical correlation with patient history and  other diagnostic information is necessary to determine patient infection status.  The expected result is negative.  Fact Sheet for Patients:   StrictlyIdeas.no   Fact Sheet for Healthcare Providers:   BankingDealers.co.za    This test is not yet approved or cleared by the [place] FDA and  has been authorized for detection and/or diagnosis of SARS-CoV-2 by FDA under an Emergency Use Authorization (EUA).  This EUA will remain in effect (meaning this test can be used) for the duration of  the  COVID-19 declaration under Section 564(b)(1) of the Act, 21 U.S.C. section 360-bbb-3(b)(1), unless the authorization is terminated or revoked sooner.  Performed at [facility], [place] [address] [place], [place] [address]   Urine culture     Status: None   Collection Time: 07/13/20  1:50 PM   Specimen: Urine, Random  Result Value Ref Range Status   Specimen Description URINE, RANDOM  Final   Special Requests NONE  Final   Culture   Final    NO GROWTH Performed at [facility], [place] [address], [place],  [address]    Report Status 07/14/2020 FINAL  Final  Blood culture (routine x 2)     Status: None (Preliminary result)   Collection Time: 07/13/20  6:50 PM   Specimen: BLOOD  Result Value Ref Range Status   Specimen Description BLOOD RIGHT ANTECUBITAL  Final   Special Requests   Final    BOTTLES DRAWN AEROBIC AND ANAEROBIC Blood Culture results may not be optimal due to an inadequate volume of blood received in culture bottles   Culture  Final    NO GROWTH < 24 HOURS Performed at  [facility], [place] [address] [place], [place] [address]    Report Status PENDING  Incomplete  Blood culture (routine x 2)     Status: None (Preliminary result)   Collection Time: 07/13/20  6:54 PM   Specimen: BLOOD  Result Value Ref Range Status   Specimen Description BLOOD LEFT ANTECUBITAL  Final   Special Requests   Final    BOTTLES DRAWN AEROBIC AND ANAEROBIC Blood Culture results may not be optimal due to an inadequate volume of blood received in culture bottles   Culture   Final    NO GROWTH < 24 HOURS Performed at [facility], [place] [address] [place], [place] [address]    Report Status PENDING  Incomplete  Culture, sputum-assessment     Status: None   Collection Time: 07/14/20 12:05 AM   Specimen: Sputum  Result Value Ref Range Status   Specimen Description SPUTUM  Final   Special Requests NONE  Final   Sputum evaluation   Final    THIS SPECIMEN IS ACCEPTABLE FOR SPUTUM CULTURE Performed at [facility], [address], [place], [place] [address]    Report Status 07/14/2020 FINAL  Final  Culture, respiratory     Status: None (Preliminary result)   Collection Time: 07/14/20 12:05 AM   Specimen: SPU  Result Value Ref Range Status   Specimen Description SPUTUM  Final   Special Requests NONE Reflexed from [id number]  Final   Gram Stain   Final    RARE WBC PRESENT,BOTH PMN AND MONONUCLEAR MODERATE GRAM POSITIVE COCCI IN PAIRS IN CLUSTERS Performed at [facility], [place] [address], [place], [place] [address]    Culture PENDING  Incomplete   Report Status PENDING  Incomplete         Radiology Studies: CT Head Wo Contrast  Result Date: 07/13/2020 CLINICAL DATA:  Mental status change. EXAM: CT HEAD WITHOUT CONTRAST TECHNIQUE: Contiguous axial images were obtained from the base of the skull through the vertex without intravenous contrast. COMPARISON:  08/10/2019 FINDINGS: Brain: No evidence for acute hemorrhage, mass lesion, midline shift,  hydrocephalus or large infarct. Small calcifications in the basal ganglia. Vascular: No hyperdense vessel or unexpected calcification. Skull: Normal. Negative for fracture or focal lesion. Sinuses/Orbits: Mucosal thickening in the maxillary sinuses, right side greater than left. Mild mucosal thickening involving the left frontal sinus and ethmoid air cells. Other: None IMPRESSION: 1. No acute intracranial abnormality. 2. Mild paranasal sinus disease. Electronically Signed   By: Markus Daft M.D.   On: 07/13/2020 10:45   DG Chest Portable 1 View  Result Date: 07/13/2020 CLINICAL DATA:  Hypoxia EXAM: PORTABLE CHEST 1 VIEW COMPARISON:  07/13/2020 at 9:51 a.m. FINDINGS: Single frontal view of the chest demonstrates stable dual lead pacemaker. Cardiac silhouette is unchanged. Since the prior exam, there is progressive central vascular congestion with increased interstitial and ground-glass opacity within the perihilar distribution. Trace right pleural effusion. No pneumothorax. IMPRESSION: 1. Worsening bilateral perihilar interstitial and ground-glass opacities, favor mild edema over progressive pneumonia. Electronically Signed   By: Randa Ngo M.D.   On: 07/13/2020 17:11   DG Chest Port 1 View  Result Date: 07/13/2020 CLINICAL DATA:  Unresponsive and cough. EXAM: PORTABLE CHEST 1 VIEW COMPARISON:  06/25/2020 FINDINGS: Stable appearance of the left dual chamber cardiac pacemaker. Heart size is upper limits of normal but stable. Evidence for a soft tissue Mach  line overlying the left upper chest. No evidence to suggest a pneumothorax. No focal airspace disease or lung consolidation. Trachea is deviated towards the right and this is unchanged. IMPRESSION: Slightly improved aeration at the lung bases compared to the prior examination. No acute cardiopulmonary disease. Electronically Signed   By: Markus Daft M.D.   On: 07/13/2020 10:35        Scheduled Meds:  amiodarone  200 mg Oral Daily   apixaban  2.5  mg Oral BID   atorvastatin  20 mg Oral QHS   cholecalciferol  1,000 Units Oral Daily   ferrous sulfate  325 mg Oral Q breakfast   insulin aspart  0-9 Units Subcutaneous TID WC   metoprolol succinate  25 mg Oral Daily   pantoprazole  40 mg Oral Daily   pregabalin  75 mg Oral QHS   vitamin B-12  1,000 mcg Oral Daily   Continuous Infusions:  ampicillin-sulbactam (UNASYN) IV 3 g (07/14/20 2336)   lactated ringers 75 mL/hr at 07/14/20 1138          Skylarr Liz, MD [facility] 07/15/2020, 9:28 AM

## 2020-07-15 NOTE — Chronic Care Management (AMB) (Signed)
°  Chronic Care Management   Inpatient Admit Review Note  07/15/2020 Name: [name] MRN: [id number] DOB: July 24, 1933  [name] is a 86 y.o. year old female who is a primary care patient of [name], MD. [name] is actively engaged with the embedded care management team in the primary care practice and is being followed by RN Case Manager Pharmacist BSW for assistance with disease management and care coordination needs related to CHF, DMII and Malignant Neoplasm.   [name] is currently admitted to the hospital for evaluation and treatment of Hypoglycemia and acute bronchitic due to COVID 19 virus.   Plan: CM team will collaborate with [facility] and will follow patient post discharge.    [name], BSW, CDP Social Worker, Certified Dementia Practitioner [place] [facility] [place] [facility] [phone]

## 2020-07-15 NOTE — Progress Notes (Signed)
Occupational Therapy Evaluation Patient Details Name: [name] MRN: [id number] DOB: 1933-01-02 Today's Date: 07/15/2020    History of Present Illness Patient is a 86 y.o. female with medical history significant of breast cancer, chronic combined systolic and diastolic CHF, chronic kidney disease stage III, history of CVA, history of left bundle branch block as well as mitral regurgitation, essential hypertension diabetes who was diagnosed with COVID-19 infection 3 days PTA with fever and chills but no respiratory distress.  Patient also was sent to get antibody infusion 06/21/20.   Onset of watery diarrhea after returning home with syncopal episode.   Clinical Impression   Pt seen for OT re-eval, presents with above diagnosis. Pt currently limited with functional mobility and transfer due to weakness in standing for functional tasks, such as sink level grooming or toilet transfers. Pt will benefit from continued acute OT to address safety functional transfers (use of RW - recommended from MD), and improved strength for ADL engagement. DC [facility] to [facility] or SNF all depending on the progression of the patient.     Follow Up Recommendations  Home health OT;SNF (depending on progress)    Equipment Recommendations  Wheelchair (measurements OT);Wheelchair cushion (measurements OT)    Recommendations for Other Services       Precautions / Restrictions Precautions Precautions: Fall Precaution Comments: diarrhea Restrictions Weight Bearing Restrictions: No      Mobility Bed Mobility Overal bed mobility: Needs Assistance Bed Mobility: Supine to Sit;Sit to Supine;Rolling Rolling: Modified independent (Device/Increase time) (with use of bed rail)   Supine to sit: Min guard        Transfers Overall transfer level: Needs assistance Equipment used: Rolling walker (2 wheeled) Transfers: Sit to/from [facility] Sit to Stand: Min guard;Min assist         General  transfer comment: Min A push to stand EOB with RW, side step with min guard with cueing for safety with seqeuncing.     Balance Overall balance assessment: Needs assistance Sitting-balance support: No upper extremity supported;Feet supported Sitting balance-Leahy Scale: Good     Standing balance support: During functional activity;Bilateral upper extremity supported Standing balance-Leahy Scale: Fair Standing balance comment: Able to take hands off of walker but unsteady and patient feels unsafe.                           ADL either performed or assessed with clinical judgement   ADL Overall ADL's : Needs assistance/impaired Eating/Feeding: Set up;Sitting   Grooming: Wash/dry face;Set up;Oral care;Sitting Grooming Details (indicate cue type and reason): oral care and wash face seated EOB.                  Toilet Transfer: RW;BSC;Supervision/safety;Min Government social research officer Details (indicate cue type and reason): simulated toilet transfer from bed <>bed Min A mostly for safety with cueing, Min Guard         Functional mobility during ADLs: Min guard;Rolling walker General ADL Comments: Pt motivated sit EOB for oral care. Advised of pursed lip breathing due to desat to 85% with bed mobility then increased back to 94% on RA.     Vision Baseline Vision/History: Wears glasses Patient Visual Report: No change from baseline       Perception     Praxis      Pertinent Vitals/Pain Pain Assessment: Faces Faces Pain Scale: Hurts little more Pain Location: hurts only when she coughs Pain Descriptors / Indicators: Discomfort;Sore Pain  Intervention(s): Limited activity within patient's tolerance;Monitored during session;Repositioned     Hand Dominance Right   Extremity/Trunk Assessment Upper Extremity Assessment Upper Extremity Assessment: Overall WFL for tasks assessed   Lower Extremity Assessment Lower Extremity Assessment: Defer to PT  evaluation RLE Deficits / Details: grossly  2+   Cervical / Trunk Assessment Cervical / Trunk Assessment: Kyphotic   Communication Communication Communication: Expressive difficulties (mild word finding)   Cognition Arousal/Alertness: Awake/alert Behavior During Therapy: WFL for tasks assessed/performed Overall Cognitive Status: Within Functional Limits for tasks assessed                                     General Comments       Exercises     Shoulder Instructions      Home Living Family/patient expects to be discharged to:: Private residence Living Arrangements: Children;Other relatives Available Help at Discharge: Family;Available PRN/intermittently Type of Home: Apartment Home Access: Level entry     Home Layout: One level         Bathroom Toilet: Standard Bathroom Accessibility: Yes How Accessible: Accessible via wheelchair Home Equipment: [place] - 4 wheels;Wheelchair - [employer] - 2 wheels;Bedside commode;Tub bench;Grab bars - toilet   Additional Comments: patient  uses [place] for mod I mobilty, HHPT working on gait with RW. Reports performing toilet transfers w/ mod I.      Prior Functioning/Environment Level of Independence: Needs assistance    ADL's / Homemaking Assistance Needed: Reports being able to perform ADLs from set up seated level. Supervision for tub transfers.   Comments: Pt active with HHPT/OT at time of admission.        OT Problem List: Decreased activity tolerance;Decreased knowledge of use of DME or AE;Obesity      OT Treatment/Interventions: Self-care/ADL training;Therapeutic activities;DME and/or AE instruction;Patient/family education    OT Goals(Current goals can be found in the care plan section) Acute Rehab OT Goals Patient Stated Goal: to get out of bed OT Goal Formulation: With patient Time For Goal Achievement: 07/08/20 Potential to Achieve Goals: Good  OT Frequency: Min 2X/week   Barriers to D/C:             Co-evaluation              AM-PAC OT "6 Clicks" Daily Activity     Outcome Measure Help from another person eating meals?: A Little Help from another person taking care of personal grooming?: A Little Help from another person toileting, which includes using toliet, bedpan, or urinal?: A Little Help from another person bathing (including washing, rinsing, drying)?: A Little Help from another person to put on and taking off regular upper body clothing?: A Little Help from another person to put on and taking off regular lower body clothing?: A Little 6 Click Score: 18   End of Session Equipment Utilized During Treatment: Gait belt;Rolling walker Nurse Communication: Mobility status  Activity Tolerance: Patient tolerated treatment well Patient left: with call bell/phone within reach;in bed;with bed alarm set  OT Visit Diagnosis: Unsteadiness on feet (R26.81);Muscle weakness (generalized) (M62.81)                Time: 0947-0962 OT Time Calculation (min): 35 min Charges:  OT General Charges $OT Visit: 1 Visit OT Evaluation $OT Re-eval: 1 Re-eval OT Treatments $Self Care/Home Management : 8-22 mins  Minus Breeding, MSOT, OTR/L  Supplemental Rehabilitation Services  [phone]   Marius Ditch  07/15/2020, 10:17 AM

## 2020-07-16 LAB — CBC WITH DIFFERENTIAL/PLATELET
Abs Immature Granulocytes: 0.01 10*3/uL (ref 0.00–0.07)
Basophils Absolute: 0 10*3/uL (ref 0.0–0.1)
Basophils Relative: 0 %
Eosinophils Absolute: 0.5 10*3/uL (ref 0.0–0.5)
Eosinophils Relative: 10 %
HCT: 27.3 % — ABNORMAL LOW (ref 36.0–46.0)
Hemoglobin: 8.4 g/dL — ABNORMAL LOW (ref 12.0–15.0)
Immature Granulocytes: 0 %
Lymphocytes Relative: 22 %
Lymphs Abs: 1 10*3/uL (ref 0.7–4.0)
MCH: 29.8 pg (ref 26.0–34.0)
MCHC: 30.8 g/dL (ref 30.0–36.0)
MCV: 96.8 fL (ref 80.0–100.0)
Monocytes Absolute: 0.5 10*3/uL (ref 0.1–1.0)
Monocytes Relative: 11 %
Neutro Abs: 2.6 10*3/uL (ref 1.7–7.7)
Neutrophils Relative %: 57 %
Platelets: UNDETERMINED 10*3/uL (ref 150–400)
RBC: 2.82 MIL/uL — ABNORMAL LOW (ref 3.87–5.11)
RDW: 14.6 % (ref 11.5–15.5)
WBC: 4.5 10*3/uL (ref 4.0–10.5)
nRBC: 0 % (ref 0.0–0.2)

## 2020-07-16 LAB — COMPREHENSIVE METABOLIC PANEL
ALT: 28 U/L (ref 0–44)
AST: 28 U/L (ref 15–41)
Albumin: 2 g/dL — ABNORMAL LOW (ref 3.5–5.0)
Alkaline Phosphatase: 54 U/L (ref 38–126)
Anion gap: 10 (ref 5–15)
BUN: 10 mg/dL (ref 8–23)
CO2: 24 mmol/L (ref 22–32)
Calcium: 8.1 mg/dL — ABNORMAL LOW (ref 8.9–10.3)
Chloride: 107 mmol/L (ref 98–111)
Creatinine, Ser: 1.13 mg/dL — ABNORMAL HIGH (ref 0.44–1.00)
GFR calc Af Amer: 51 mL/min — ABNORMAL LOW (ref >60)
GFR calc non Af Amer: 44 mL/min — ABNORMAL LOW (ref >60)
Glucose, Bld: 222 mg/dL — ABNORMAL HIGH (ref 70–99)
Potassium: 3.9 mmol/L (ref 3.5–5.1)
Sodium: 141 mmol/L (ref 135–145)
Total Bilirubin: 0.6 mg/dL (ref 0.3–1.2)
Total Protein: 5.3 g/dL — ABNORMAL LOW (ref 6.5–8.1)

## 2020-07-16 LAB — GLUCOSE, CAPILLARY
Glucose-Capillary: 208 mg/dL — ABNORMAL HIGH (ref 70–99)
Glucose-Capillary: 258 mg/dL — ABNORMAL HIGH (ref 70–99)
Glucose-Capillary: 211 mg/dL — ABNORMAL HIGH (ref 70–99)
Glucose-Capillary: 210 mg/dL — ABNORMAL HIGH (ref 70–99)

## 2020-07-16 LAB — CULTURE, RESPIRATORY W GRAM STAIN: Culture: NORMAL

## 2020-07-16 LAB — C-REACTIVE PROTEIN: CRP: 7.8 mg/dL — ABNORMAL HIGH (ref <1.0)

## 2020-07-16 LAB — MRSA PCR SCREENING: MRSA by PCR: NEGATIVE

## 2020-07-16 LAB — BRAIN NATRIURETIC PEPTIDE: B Natriuretic Peptide: 872.3 pg/mL — ABNORMAL HIGH (ref 0.0–100.0)

## 2020-07-16 LAB — MAGNESIUM: Magnesium: 2.2 mg/dL (ref 1.7–2.4)

## 2020-07-16 MED ORDER — ANASTROZOLE 1 MG PO TABS
1.0000 mg | ORAL_TABLET | Freq: Every day | ORAL | Status: DC
  Administered 2020-07-16 – 2020-07-20 (×5): 1 mg via ORAL
  Filled 2020-07-16 (×5): qty 1

## 2020-07-16 MED ORDER — INSULIN DETEMIR 100 UNIT/ML ~~LOC~~ SOLN
10.0000 [IU] | Freq: Every day | SUBCUTANEOUS | Status: DC
  Administered 2020-07-16: 10 [IU] via SUBCUTANEOUS
  Filled 2020-07-16 (×2): qty 0.1

## 2020-07-16 MED ORDER — GUAIFENESIN 100 MG/5ML PO SOLN
5.0000 mL | ORAL | Status: DC | PRN
  Administered 2020-07-16 – 2020-07-18 (×4): 100 mg via ORAL
  Filled 2020-07-16 (×4): qty 5

## 2020-07-16 MED ORDER — SODIUM CHLORIDE 0.9 % IV SOLN
3.0000 g | Freq: Four times a day (QID) | INTRAVENOUS | Status: AC
  Administered 2020-07-16 – 2020-07-20 (×19): 3 g via INTRAVENOUS
  Filled 2020-07-16 (×2): qty 8
  Filled 2020-07-16: qty 3
  Filled 2020-07-16: qty 8
  Filled 2020-07-16: qty 2.47
  Filled 2020-07-16 (×4): qty 8
  Filled 2020-07-16: qty 3
  Filled 2020-07-16: qty 2.47
  Filled 2020-07-16: qty 8
  Filled 2020-07-16: qty 3
  Filled 2020-07-16 (×2): qty 8
  Filled 2020-07-16: qty 3
  Filled 2020-07-16: qty 8
  Filled 2020-07-16: qty 2.47
  Filled 2020-07-16: qty 8

## 2020-07-16 NOTE — Progress Notes (Signed)
SLP Cancellation Note  Patient Details Name: KAYLEANNA LORMAN MRN: [id number] DOB: June 26, 1933   Cancelled treatment:       Reason Eval/Treat Not Completed: Patient at procedure or test/unavailable (Pt having palliative care family meeting at this time. SLP will follow up on subsequent date.)  Calea Hribar I. Hardin Negus, [place], [place] Office number [phone] Pager [place] 07/16/2020, 3:41 PM

## 2020-07-16 NOTE — Progress Notes (Signed)
Pharmacy Antibiotic Note  [name] is a 86 y.o. female admitted on 07/13/2020 with COVID-19 infection and aspiration pna. Pharmacy has been consulted for Unasyn dosing. Cr improved this morning, CrCl ~40.  Plan: Increase Unasyn to 3g IV q6h for now  Height: 5\' 5"  (165.1 cm) Weight: 88.8 kg (195 lb 12.3 oz) IBW/kg (Calculated) : 57  Temp (24hrs), Avg:99.9 F (37.7 C), Min:98.8 F (37.1 C), Max:101.2 F (38.4 C)  Recent Labs  Lab 07/13/20 0934 07/13/20 0935 07/13/20 1213 07/13/20 1850 07/13/20 2116 07/13/20 2316 07/14/20 0020 07/14/20 0900 07/15/20 1411 07/16/20 0633  WBC 4.2  --   --   --   --   --   --  7.7 5.7 4.5  CREATININE 1.71*  --   --   --   --   --  1.67* 1.61* 1.22* 1.13*  LATICACIDVEN  --  0.8 1.1 1.7 0.5 1.9  --   --   --   --     Estimated Creatinine Clearance: 40.1 mL/min (A) (by C-G formula based on SCr of 1.13 mg/dL (H)).    Allergies  Allergen Reactions  . Aspirin Itching  . Penicillins Itching and Rash    DID THE REACTION INVOLVE: Swelling of the face/tongue/throat, SOB, or low BP? Yes Sudden or severe rash/hives, skin peeling, or the inside of the mouth or nose? No Did it require medical treatment? Yes When did it last happen?"More than 10 years ago" If all above answers are "NO", may proceed with cephalosporin use.     Arrie Senate, PharmD, BCPS Clinical Pharmacist [phone] Please check AMION for all [place] numbers 07/16/2020

## 2020-07-16 NOTE — Progress Notes (Signed)
Patient ID: [name], female   DOB: October 19, 1933, 86 y.o.   MRN: [id number]  PROGRESS NOTE    [name]  ENI:[id number] DOB: [birth year] DOA: 07/13/2020 PCP: [name], MD   Brief Narrative:  86 year old female with history of diabetes mellitus type 2, breast cancer on anastrozole, SVT status post ablation with permanent pacemaker, chronic combined heart failure, chronic kidney disease stage IIIb, unspecified stroke, recurrent DVT on Eliquis, recent COVID-19 pneumonia and discharged to a nursing home presented with very low blood sugars at nursing home along with poor appetite and brief.  Of unresponsiveness.  On presentation, CT of the head was negative for any acute abnormality.  She was noted to be slightly hypoxic with mild fever and there was a suspicion for aspiration pneumonia for which she was started on antibiotics.   Assessment & Plan:   Possible aspiration pneumonia hypoxia -Currently on Unasyn  -Currently on room air. -Diet as per SLP recommendations.  Cultures negative so far -Still spiking temperatures  Acute metabolic encephalopathy -Probably from pneumonia and hypoglycemia. -Mental status improving.  Continue neurochecks.  Fall precautions.  PT recommends SNF placement.  Recent fully treated COVID-19 pneumonia -Patient had a recent hospitalization and was treated for COVID-19 pneumonia. -Incentive spirometry.  Hypertension -Blood pressure stable.  Continue beta-blocker.  Of IV fluids  Diabetes mellitus type 2 uncontrolled with hypoglycemia/hyperglycemia -Presented with hypoglycemia and altered mental status probably from poor oral intake.  A1c 9.0.  Blood sugars are trending upwards while hospitalized.  Continue CBGs with SSI.  Dehydration -Improving.  Off IV fluids.  AKI on CKD stage IIIb -Baseline creatinine 1.5.  Creatinine 1.13 today.  Chronic combined CHF -EF of 30% on most recent echocardiogram.  Lasix on hold.  Strict input output.  Daily  weights.  No signs of fluid overload -Cardiology evaluation appreciated.  Continue Toprol.  Positive troponins -Probably from demand ischemia.  Cardiology following and recommending no plans for further inpatient ischemic evaluation.  History of DVT -Continue Eliquis  Prior unspecified CVA -Continue statin and Eliquis  History of SVT/pacemaker placement -On amiodarone as an outpatient for possible?  SVT.  Continue amiodarone.  Outpatient follow-up with cardiology  Chronic weakness and deconditioning -Wheelchair-bound at baseline.  Currently from SNF.  Social worker following for placement back to SNF -Palliative care evaluation for goals of care discussion   DVT prophylaxis: Eliquis Code Status: Full Family Communication: Spoke to daughter & granddaughter on phone on 07/15/2020 disposition Plan: Status is: Inpatient  Remains inpatient appropriate because:Inpatient level of care appropriate due to severity of illness   Dispo: The patient is from: SNF              Anticipated d/c is to: SNF              Anticipated d/c date is: 1 day              Patient currently is not medically stable to d/c.   Consultants: Cardiology/palliative care Procedures: None  Antimicrobials:  Anti-infectives (From admission, onward)   Start     Dose/Rate Route Frequency Ordered Stop   07/14/20 1900  cefTRIAXone (ROCEPHIN) 1 g in sodium chloride 0.9 % 100 mL IVPB  Status:  Discontinued        1 g 200 mL/hr over 30 Minutes Intravenous Every 24 hours 07/13/20 2002 07/14/20 0924   07/14/20 1800  azithromycin (ZITHROMAX) 500 mg in sodium chloride 0.9 % 250 mL IVPB  Status:  Discontinued  500 mg 250 mL/hr over 60 Minutes Intravenous Every 24 hours 07/13/20 2010 07/14/20 0924   07/14/20 1000  Ampicillin-Sulbactam (UNASYN) 3 g in sodium chloride 0.9 % 100 mL IVPB        3 g 200 mL/hr over 30 Minutes Intravenous Every 12 hours 07/14/20 0928     07/13/20 1815  cefTRIAXone (ROCEPHIN) 1 g in sodium  chloride 0.9 % 100 mL IVPB        1 g 200 mL/hr over 30 Minutes Intravenous  Once 07/13/20 1803 07/13/20 2003   07/13/20 1815  azithromycin (ZITHROMAX) 500 mg in sodium chloride 0.9 % 250 mL IVPB        500 mg 250 mL/hr over 60 Minutes Intravenous  Once 07/13/20 1803 07/13/20 2114       Subjective: Patient seen and examined at bedside.  Poor historian.  Still complains of cough with some shortness of breath.  Feels slightly better.  Still having fevers.  No overnight chest pain, abdominal pain or diarrhea..  Objective: Vitals:   07/15/20 1920 07/15/20 1924 07/16/20 0002 07/16/20 0514  BP:  120/64 (!) 142/77 (!) 145/74  Pulse: 81 81 78 80  Resp:  20 16 20   Temp:  99.3 F (37.4 C) 100.1 F (37.8 C) (!) 100.5 F (38.1 C)  TempSrc:  Oral Oral Oral  SpO2: 90% 93% 94% 92%  Weight:    88.8 kg  Height:        Intake/Output Summary (Last 24 hours) at 07/16/2020 0752 Last data filed at 07/15/2020 2206 Gross per 24 hour  Intake 220 ml  Output 900 ml  Net -680 ml   Filed Weights   07/13/20 0942 07/16/20 0514  Weight: 87 kg 88.8 kg    Examination:  General exam: No acute distress.  Currently on room air.  Chronically ill looking female lying on bed. Respiratory system: Bilateral decreased breath sounds at bases with some crackles.  No wheezing Cardiovascular system: Rate controlled, S1-S2 heard Gastrointestinal system: Abdomen is nondistended, soft and nontender.  Bowel sounds are heard  extremities: Bilateral lower extremity edema present.  No clubbing  Central nervous system: Alert and awake but still slow to respond.  No focal neurological deficits.  Moves extremities Skin: No obvious ecchymosis/rashes Psychiatry: Affect is flat    Data Reviewed: I have personally reviewed following labs and imaging studies  CBC: Recent Labs  Lab 07/13/20 0934 07/13/20 0943 07/14/20 0900 07/15/20 1411  WBC 4.2  --  7.7 5.7  NEUTROABS 2.8  --  5.7 4.0  HGB 9.9* 9.9* 10.2* 9.8*   HCT 32.2* 29.0* 34.3* 31.6*  MCV 97.9  --  101.2* 99.1  PLT 111*  --  116* 562*   Basic Metabolic Panel: Recent Labs  Lab 07/13/20 0934 07/13/20 0934 07/13/20 0943 07/14/20 0020 07/14/20 0900 07/15/20 1411 07/16/20 0633  NA 144   < > 144 141 143 140 141  K 3.1*   < > 3.2* 4.1 4.3 4.7 3.9  CL 104  --   --  106 106 111 107  CO2 29  --   --  25 26 24 24   GLUCOSE 71  --   --  215* 171* 274* 222*  BUN 19  --   --  19 17 13 10   CREATININE 1.71*  --   --  1.67* 1.61* 1.22* 1.13*  CALCIUM 8.3*  --   --  7.9* 8.3* 8.5* 8.1*  MG  --   --   --   --  2.2 2.2 2.2   < > = values in this interval not displayed.   GFR: Estimated Creatinine Clearance: 40.1 mL/min (A) (by C-G formula based on SCr of 1.13 mg/dL (H)). Liver Function Tests: Recent Labs  Lab 07/13/20 0934 07/14/20 0020 07/14/20 0900 07/15/20 1411 07/16/20 0633  AST 27 37 42* 36 28  ALT 22 27 32 31 28  ALKPHOS 62 59 65 64 54  BILITOT 0.5 0.5 0.7 0.6 0.6  PROT 6.5 5.7* 6.6 6.2* 5.3*  ALBUMIN 2.8* 2.3* 2.6* 2.4* 2.0*   No results for input(s): LIPASE, AMYLASE in the last 168 hours. No results for input(s): AMMONIA in the last 168 hours. Coagulation Profile: Recent Labs  Lab 07/13/20 0934  INR 1.1   Cardiac Enzymes: No results for input(s): CKTOTAL, CKMB, CKMBINDEX, TROPONINI in the last 168 hours. BNP (last 3 results) No results for input(s): PROBNP in the last 8760 hours. HbA1C: Recent Labs    07/14/20 0900  HGBA1C 9.0*   CBG: Recent Labs  Lab 07/14/20 1731 07/15/20 0845 07/15/20 1137 07/15/20 1640 07/15/20 2211  GLUCAP 213* 196* 173* 243* 268*   Lipid Profile: No results for input(s): CHOL, HDL, LDLCALC, TRIG, CHOLHDL, LDLDIRECT in the last 72 hours. Thyroid Function Tests: Recent Labs    07/14/20 0900  TSH 1.099   Anemia Panel: Recent Labs    07/14/20 0900  VITAMINB12 3,092*  FOLATE 19.4  FERRITIN 208  TIBC 262  IRON 20*  RETICCTPCT 2.7   Sepsis Labs: Recent Labs  Lab  07/13/20 1213 07/13/20 1850 07/13/20 2116 07/13/20 2316 07/14/20 0900  PROCALCITON  --   --   --   --  8.15  LATICACIDVEN 1.1 1.7 0.5 1.9  --     Recent Results (from the past 240 hour(s))  SARS Coronavirus 2 by RT PCR (hospital order, performed in [place] hospital lab) Nasopharyngeal Nasopharyngeal Swab     Status: Abnormal   Collection Time: 07/13/20  9:40 AM   Specimen: Nasopharyngeal Swab  Result Value Ref Range Status   SARS Coronavirus 2 POSITIVE (A) NEGATIVE Final    Comment: emailed L. Berdik RN 11:55 07/13/20 ([email or web address]) (NOTE) SARS-CoV-2 target nucleic acids are DETECTED  SARS-CoV-2 RNA is generally detectable in upper respiratory specimens  during the acute phase of infection.  Positive results are indicative  of the presence of the identified virus, but do not rule out bacterial infection or co-infection with other pathogens not detected by the test.  Clinical correlation with patient history and  other diagnostic information is necessary to determine patient infection status.  The expected result is negative.  Fact Sheet for Patients:   StrictlyIdeas.no   Fact Sheet for Healthcare Providers:   BankingDealers.co.za    This test is not yet approved or cleared by the [place] FDA and  has been authorized for detection and/or diagnosis of SARS-CoV-2 by FDA under an Emergency Use Authorization (EUA).  This EUA will remain in effect (meaning this test can be used) for the duration of  the  COVID-19 declaration under Section 564(b)(1) of the Act, 21 U.S.C. section 360-bbb-3(b)(1), unless the authorization is terminated or revoked sooner.  Performed at [facility], [place] [address], [place], [place] [address]   Urine culture     Status: None   Collection Time: 07/13/20  1:50 PM   Specimen: Urine, Random  Result Value Ref Range Status   Specimen Description URINE, RANDOM  Final   Special Requests NONE   Final  Culture   Final    NO GROWTH Performed at [facility], [place] [address] [place], [place] [address]    Report Status 07/14/2020 FINAL  Final  Blood culture (routine x 2)     Status: None (Preliminary result)   Collection Time: 07/13/20  6:50 PM   Specimen: BLOOD  Result Value Ref Range Status   Specimen Description BLOOD RIGHT ANTECUBITAL  Final   Special Requests   Final    BOTTLES DRAWN AEROBIC AND ANAEROBIC Blood Culture results may not be optimal due to an inadequate volume of blood received in culture bottles   Culture   Final    NO GROWTH 2 DAYS Performed at [facility], [place] [address], [place], [place] [address]    Report Status PENDING  Incomplete  Blood culture (routine x 2)     Status: None (Preliminary result)   Collection Time: 07/13/20  6:54 PM   Specimen: BLOOD  Result Value Ref Range Status   Specimen Description BLOOD LEFT ANTECUBITAL  Final   Special Requests   Final    BOTTLES DRAWN AEROBIC AND ANAEROBIC Blood Culture results may not be optimal due to an inadequate volume of blood received in culture bottles   Culture   Final    NO GROWTH 2 DAYS Performed at [facility], [place] [address] [place], [place] [address]    Report Status PENDING  Incomplete  Culture, sputum-assessment     Status: None   Collection Time: 07/14/20 12:05 AM   Specimen: Sputum  Result Value Ref Range Status   Specimen Description SPUTUM  Final   Special Requests NONE  Final   Sputum evaluation   Final    THIS SPECIMEN IS ACCEPTABLE FOR SPUTUM CULTURE Performed at [facility], [address] [place], [place] [address]    Report Status 07/14/2020 FINAL  Final  Culture, respiratory     Status: None (Preliminary result)   Collection Time: 07/14/20 12:05 AM   Specimen: SPU  Result Value Ref Range Status   Specimen Description SPUTUM  Final   Special Requests NONE Reflexed from [id number]  Final   Gram Stain   Final    RARE WBC PRESENT,BOTH PMN AND  MONONUCLEAR MODERATE GRAM POSITIVE COCCI IN PAIRS IN CLUSTERS    Culture   Final    CULTURE REINCUBATED FOR BETTER GROWTH Performed at [facility], [place] [address] [place], [place] [address]    Report Status PENDING  Incomplete         Radiology Studies: No results found.      Scheduled Meds: . amiodarone  200 mg Oral Daily  . apixaban  2.5 mg Oral BID  . atorvastatin  20 mg Oral QHS  . cholecalciferol  1,000 Units Oral Daily  . feeding supplement (GLUCERNA SHAKE)  237 mL Oral BID BM  . ferrous sulfate  325 mg Oral Q breakfast  . influenza vaccine adjuvanted  0.5 mL Intramuscular Tomorrow-1000  . insulin aspart  0-9 Units Subcutaneous TID WC  . metoprolol succinate  25 mg Oral Daily  . pantoprazole  40 mg Oral Daily  . pregabalin  75 mg Oral QHS  . vitamin B-12  1,000 mcg Oral Daily   Continuous Infusions: . ampicillin-sulbactam (UNASYN) IV 3 g (07/15/20 2206)          Aline August, MD [facility] 07/16/2020, 7:52 AM

## 2020-07-16 NOTE — Progress Notes (Signed)
Inpatient Diabetes Program Recommendations  AACE/ADA: New Consensus Statement on Inpatient Glycemic Control (2015)  Target Ranges:  Prepandial:   less than 140 mg/dL      Peak postprandial:   less than 180 mg/dL (1-2 hours)      Critically ill patients:  140 - 180 mg/dL   Lab Results  Component Value Date   GLUCAP 208 (H) 07/16/2020   HGBA1C 9.0 (H) 07/14/2020    Review of Glycemic Control Results for MACAIAH, MANGAL (MRN [id number]) as of 07/16/2020 12:19  Ref. Range 07/15/2020 11:37 07/15/2020 16:40 07/15/2020 22:11 07/16/2020 07:50  Glucose-Capillary Latest Ref Range: 70 - 99 mg/dL 173 (H) 243 (H) 268 (H) 208 (H)   Diabetes history: Type 2 DM Outpatient Diabetes medications: Ozempic 0.25 mg Qwed, Humalog 30 units TID, Levemir 35 units QHS Current orders for Inpatient glycemic control: Novolog 0-9 units TID  Inpatient Diabetes Program Recommendations:    Consider adding Levemir 15 units QHS.   Thanks, Bronson Curb, MSN, RNC-OB Diabetes Coordinator [phone] (8a-5p)

## 2020-07-16 NOTE — Plan of Care (Signed)
  Problem: Elimination: Goal: Will not experience complications related to urinary retention Outcome: Progressing   Problem: Pain Managment: Goal: General experience of comfort will improve Outcome: Progressing   

## 2020-07-16 NOTE — Progress Notes (Signed)
Palliative:  HPI: 86 y.o. female  with past medical history of diabetes, breast cancer on anastrozole, SVT s/p ablation with permanent pacemaker, combined heart failure EF 30%, CKD stage 3b, stroke, recurrent DVT on Eliquis, recent COVID-19 pneumonia admitted on 07/13/2020 with hypoglycemia associated with poor intake (also on insulin) and brief period of unresponsiveness. CT head negative for acute changes. Concern for aspiration pneumonia. Wheel-chair bound at baseline.   I met today with [name] and her daughter and granddaughter, [name] and [name]. We discussed [name]'s deconditioned state since COVID and pneumonia infections. We discussed CHF and chronic kidney disease and how this progresses. After fully discussing her medical conditions we discussed Living Will and decisions. [name] has decided for DNR status. Family supports her decisions. After much discussion they also determined that she would not want long term feeding tube (unless condition potentially reversible and with good QOL this could be considered). She names both [name] as her HCPOAs and she would want them to make decisions together.   [name] has taken care of multiple members of her family and has made note of everything that they went through. She does not want to suffer and family would not want her to suffer. She is also a woman of strong faith and this provides them comfort as well. [name] is at peace when her time comes and expresses that she has had a good life. She is hopeful for more time and is doing well considering all she has been through lately. She is hopeful to continue working with therapy in rehab and ultimately return home where she lives with her daughter [name].   Overall very good conversation today. All questions/concerns addressed. Emotional support provided.   Exam: Alert, oriented. Good spirits. No distress. Breathing regular, unlabored. Abd flat, soft. BLE with chronic edema  (better than baseline at home per family).   Plan: - Living Will filled out and discussed. Spiritual consult to notarize tomorrow hopefully.  - DNR decided.  - SNF rehab with plans to ultimately return home.   [place], NP Palliative Medicine Team Pager [phone] (Please see [email or web address] for schedule) Team Phone [phone]    Greater than 50%  of this time was spent counseling and coordinating care related to the above assessment and plan

## 2020-07-17 ENCOUNTER — Inpatient Hospital Stay (HOSPITAL_COMMUNITY): Payer: Medicare Other

## 2020-07-17 DIAGNOSIS — I5032 Chronic diastolic (congestive) heart failure: Secondary | ICD-10-CM

## 2020-07-17 LAB — RESPIRATORY PANEL BY RT PCR (FLU A&B, COVID)
Influenza A by PCR: NEGATIVE
Influenza B by PCR: NEGATIVE
SARS Coronavirus 2 by RT PCR: NEGATIVE

## 2020-07-17 LAB — CBC WITH DIFFERENTIAL/PLATELET
Abs Immature Granulocytes: 0.01 10*3/uL (ref 0.00–0.07)
Basophils Absolute: 0 10*3/uL (ref 0.0–0.1)
Basophils Relative: 0 %
Eosinophils Absolute: 0.5 10*3/uL (ref 0.0–0.5)
Eosinophils Relative: 12 %
HCT: 28.7 % — ABNORMAL LOW (ref 36.0–46.0)
Hemoglobin: 8.7 g/dL — ABNORMAL LOW (ref 12.0–15.0)
Immature Granulocytes: 0 %
Lymphocytes Relative: 20 %
Lymphs Abs: 0.8 10*3/uL (ref 0.7–4.0)
MCH: 29.4 pg (ref 26.0–34.0)
MCHC: 30.3 g/dL (ref 30.0–36.0)
MCV: 97 fL (ref 80.0–100.0)
Monocytes Absolute: 0.5 10*3/uL (ref 0.1–1.0)
Monocytes Relative: 12 %
Neutro Abs: 2.1 10*3/uL (ref 1.7–7.7)
Neutrophils Relative %: 56 %
Platelets: 143 10*3/uL — ABNORMAL LOW (ref 150–400)
RBC: 2.96 MIL/uL — ABNORMAL LOW (ref 3.87–5.11)
RDW: 14.2 % (ref 11.5–15.5)
WBC: 3.8 10*3/uL — ABNORMAL LOW (ref 4.0–10.5)
nRBC: 0 % (ref 0.0–0.2)

## 2020-07-17 LAB — COMPREHENSIVE METABOLIC PANEL
ALT: 30 U/L (ref 0–44)
AST: 31 U/L (ref 15–41)
Albumin: 2.2 g/dL — ABNORMAL LOW (ref 3.5–5.0)
Alkaline Phosphatase: 55 U/L (ref 38–126)
Anion gap: 13 (ref 5–15)
BUN: 7 mg/dL — ABNORMAL LOW (ref 8–23)
CO2: 21 mmol/L — ABNORMAL LOW (ref 22–32)
Calcium: 8.2 mg/dL — ABNORMAL LOW (ref 8.9–10.3)
Chloride: 107 mmol/L (ref 98–111)
Creatinine, Ser: 1.08 mg/dL — ABNORMAL HIGH (ref 0.44–1.00)
GFR calc Af Amer: 54 mL/min — ABNORMAL LOW (ref >60)
GFR calc non Af Amer: 47 mL/min — ABNORMAL LOW (ref >60)
Glucose, Bld: 182 mg/dL — ABNORMAL HIGH (ref 70–99)
Potassium: 4.2 mmol/L (ref 3.5–5.1)
Sodium: 141 mmol/L (ref 135–145)
Total Bilirubin: 0.4 mg/dL (ref 0.3–1.2)
Total Protein: 5.7 g/dL — ABNORMAL LOW (ref 6.5–8.1)

## 2020-07-17 LAB — BRAIN NATRIURETIC PEPTIDE: B Natriuretic Peptide: 856.6 pg/mL — ABNORMAL HIGH (ref 0.0–100.0)

## 2020-07-17 LAB — GLUCOSE, CAPILLARY
Glucose-Capillary: 204 mg/dL — ABNORMAL HIGH (ref 70–99)
Glucose-Capillary: 171 mg/dL — ABNORMAL HIGH (ref 70–99)
Glucose-Capillary: 150 mg/dL — ABNORMAL HIGH (ref 70–99)
Glucose-Capillary: 223 mg/dL — ABNORMAL HIGH (ref 70–99)

## 2020-07-17 LAB — MAGNESIUM: Magnesium: 2.1 mg/dL (ref 1.7–2.4)

## 2020-07-17 LAB — C-REACTIVE PROTEIN: CRP: 5.9 mg/dL — ABNORMAL HIGH (ref <1.0)

## 2020-07-17 MED ORDER — INSULIN DETEMIR 100 UNIT/ML ~~LOC~~ SOLN
15.0000 [IU] | Freq: Every day | SUBCUTANEOUS | Status: DC
  Administered 2020-07-17 – 2020-07-20 (×4): 15 [IU] via SUBCUTANEOUS
  Filled 2020-07-17 (×4): qty 0.15

## 2020-07-17 MED ORDER — LOPERAMIDE HCL 2 MG PO CAPS
2.0000 mg | ORAL_CAPSULE | Freq: Four times a day (QID) | ORAL | Status: DC | PRN
  Administered 2020-07-18: 2 mg via ORAL
  Filled 2020-07-17: qty 1

## 2020-07-17 MED ORDER — TRAMADOL HCL 50 MG PO TABS: 50.0000 mg | ORAL_TABLET | Freq: Four times a day (QID) | ORAL | Status: DC | PRN

## 2020-07-17 NOTE — Care Management Important Message (Signed)
Important Message  Patient Details  Name: [name] MRN: [id number] Date of Birth: 07-10-1933   Medicare Important Message Given:  Yes  Gave letter to nurse Ray to deliver to pt.     Holli Humbles Smith 07/17/2020, 3:31 PM

## 2020-07-17 NOTE — TOC Progression Note (Signed)
Transition of Care [facility]) - Progression Note    Patient Details  Name: [name] MRN: [id number] Date of Birth: 1933/09/08  Transition of Care [facility]) CM/SW [place], [place] Phone Number: 07/17/2020, 11:54 AM  Clinical Narrative:     CSW started insurance authorization for patient. Reference #[id number]. Requested start date for today.  SNF bed offers pending. Insurance authorization started.  CSW will continue to follow.       Expected Discharge Plan and Services                                                 Social Determinants of Health (SDOH) Interventions    Readmission Risk Interventions Readmission Risk Prevention Plan 01/03/2019 01/02/2019  Transportation Screening (No Data) Complete  PCP or Specialist Appt within 3-5 Days - Complete  HRI or [place] - Complete  Social Work Consult for [place] Planning/Counseling - Complete  Palliative Care Screening - Not Applicable  Medication Review [employer] photographer) - Complete  Some recent data might be hidden

## 2020-07-17 NOTE — Progress Notes (Signed)
OT Cancellation Note  Patient Details Name: [name] MRN: [id number] DOB: [birth year]   Cancelled Treatment:    Reason Eval/Treat Not Completed: Patient declined, no reason specified (Patient states loose stools all day and is too tired.  )  Keven Soucy D Cherine Drumgoole 07/17/2020, 3:11 PM  07/17/2020  Denice Paradise, OTR/L  Acute Rehabilitation Services  Office:  [phone]

## 2020-07-17 NOTE — NC FL2 (Signed)
[place] MEDICAID FL2 LEVEL OF CARE SCREENING TOOL     IDENTIFICATION  Patient Name: [name] Birthdate: [birth year] Sex: female Admission Date (Current Location): 07/13/2020  [facility] and [place] Number:  Herbalist and Address:  [facility] [place]. [facility], [place] [address], [place], [place] [address]      Provider Number: [id number]  Attending Physician Name and Address:  Aline August, MD  Relative Name and Phone Number:  [name] [phone]    Current Level of Care: Hospital Recommended Level of Care: [place] Prior Approval Number:    Date Approved/Denied:   PASRR Number: [id number]  Discharge Plan: SNF    Current Diagnoses: Patient Active Problem List   Diagnosis Date Noted  . Declining functional status   . Goals of care, counseling/discussion   . Palliative care by specialist   . Aspiration pneumonia ([place]) 07/13/2020  . Hypoglycemia 07/13/2020  . Elevated troponin 07/13/2020  . Sepsis ([place]) 07/13/2020  . Syncope and collapse 06/22/2020  . COVID-19 virus infection 06/22/2020  . Hypokalemia 06/22/2020  . Pacemaker 05/06/2020  . Syncope 08/10/2019  . Displaced fracture of lateral malleolus of right fibula, initial encounter for closed fracture 07/10/2019  . Pain in left shoulder 07/10/2019  . Acute CHF (congestive heart failure) ([place]) 06/09/2019  . Acute pulmonary edema (HCC)   . Personal history of breast cancer 04/22/2019  . Pressure ulcer 01/04/2019  . Acute pancreatitis 01/02/2019  . Malignant neoplasm of lower-inner quadrant of right breast of female, estrogen receptor positive ([place]) 11/23/2018  . Hematoma of right iliopsoas muscle 11/06/2018  . Intramuscular hematoma 11/06/2018  . Hypertensive heart disease with heart failure ([place]) 09/05/2017  . Aphasia 08/31/2017  . Dysphagia 08/31/2017  . CKD (chronic kidney disease), stage III ([place])   . LBBB (left bundle branch block)   . Anemia   . HTN (hypertension)    . Obesity   . DVT (deep venous thrombosis) ([place])   . DM (diabetes mellitus) ([place]) 07/28/2015  . Fall 07/28/2015  . Warfarin-induced coagulopathy ([place]) 07/28/2015  . Acute on chronic diastolic CHF (congestive heart failure) ([place]) 03/19/2014  . Edema 08/06/2013  . SVT (supraventricular tachycardia) ([place]) 10/15/2012  . First degree AV block 10/15/2012  . Acute renal insufficiency 10/14/2012  . Dizziness and giddiness   . Thrombocytopenia ([place]) 11/21/2011    Orientation RESPIRATION BLADDER Height & Weight     Self, Time, Situation, Place  Normal Continent, External catheter (External Urinary Catheter) Weight: 191 lb 2.2 oz (86.7 kg) Height:  5\' 5"  (165.1 cm)  BEHAVIORAL SYMPTOMS/MOOD NEUROLOGICAL BOWEL NUTRITION STATUS      Continent Diet (See Discharge Summary)  AMBULATORY STATUS COMMUNICATION OF NEEDS Skin   Limited Assist Verbally Normal                       Personal Care Assistance Level of Assistance  Bathing, Feeding, Dressing Bathing Assistance: Limited assistance Feeding assistance: Limited assistance (Needs assist sitting up;Fluid Restriction Dysphagia 2 thin liquids) Dressing Assistance: Limited assistance     Functional Limitations Info  Sight, Hearing, Speech Sight Info: Impaired Hearing Info: Impaired Speech Info: Adequate    SPECIAL CARE FACTORS FREQUENCY  PT (By licensed PT), OT (By licensed OT)     PT Frequency: 5x min weekly OT Frequency: 5x min weekly            Contractures Contractures Info: Not present    Additional Factors Info  Code Status, Allergies, Insulin Sliding  Scale Code Status Info: DNR Allergies Info: Aspirin,Penicillins   Insulin Sliding Scale Info: insulin aspart (novoLOG) injection 0-9 Units 3 times daily with meals,insulin detemir (LEVEMIR) injection 15 Units daily at bedtime,       Current Medications (07/17/2020):  This is the current hospital active medication list Current Facility-Administered Medications   Medication Dose Route Frequency Provider Last Rate Last Admin  . acetaminophen (TYLENOL) tablet 650 mg  650 mg Oral Q6H PRN Toy Baker, MD   650 mg at 07/16/20 1029   Or  . acetaminophen (TYLENOL) suppository 650 mg  650 mg Rectal Q6H PRN Doutova, Anastassia, MD      . amiodarone (PACERONE) tablet 200 mg  200 mg Oral Daily Doutova, Anastassia, MD   200 mg at 07/17/20 0937  . Ampicillin-Sulbactam (UNASYN) 3 g in sodium chloride 0.9 % 100 mL IVPB  3 g Intravenous Q6H Einar Grad, RPH 200 mL/hr at 07/17/20 0947 3 g at 07/17/20 0947  . anastrozole (ARIMIDEX) tablet 1 mg  1 mg Oral Daily Starla Link, Kshitiz, MD   1 mg at 07/16/20 1740  . apixaban (ELIQUIS) tablet 2.5 mg  2.5 mg Oral BID Bertis Ruddy, RPH   2.5 mg at 07/17/20 3007  . atorvastatin (LIPITOR) tablet 20 mg  20 mg Oral QHS Toy Baker, MD   20 mg at 07/16/20 2155  . cholecalciferol (VITAMIN D3) tablet 1,000 Units  1,000 Units Oral Daily Thurnell Lose, MD   1,000 Units at 07/17/20 0936  . feeding supplement (GLUCERNA SHAKE) (GLUCERNA SHAKE) liquid 237 mL  237 mL Oral BID BM Pershing Proud, NP   622 mL at 07/16/20 1303  . ferrous sulfate tablet 325 mg  325 mg Oral Q breakfast Toy Baker, MD   325 mg at 07/17/20 0936  . guaiFENesin (ROBITUSSIN) 100 MG/5ML solution 100 mg  5 mL Oral Q4H PRN Opyd, Ilene Qua, MD   100 mg at 07/16/20 2156  . insulin aspart (novoLOG) injection 0-9 Units  0-9 Units Subcutaneous TID WC Thurnell Lose, MD   3 Units at 07/17/20 [id number]  . insulin detemir (LEVEMIR) injection 15 Units  15 Units Subcutaneous QHS Alekh, Kshitiz, MD      . ipratropium-albuterol (DUONEB) 0.5-2.5 (3) MG/3ML nebulizer solution 3 mL  3 mL Nebulization Q6H PRN Alekh, Kshitiz, MD      . loperamide (IMODIUM) capsule 2 mg  2 mg Oral Q6H PRN Alekh, Kshitiz, MD      . metoprolol succinate (TOPROL-XL) 24 hr tablet 25 mg  25 mg Oral Daily Lelon Perla, MD   25 mg at 07/17/20 0937  . ondansetron (ZOFRAN)  injection 4 mg  4 mg Intravenous Q6H PRN Doutova, Anastassia, MD      . pantoprazole (PROTONIX) EC tablet 40 mg  40 mg Oral Daily Thurnell Lose, MD   40 mg at 07/17/20 0937  . pregabalin (LYRICA) capsule 75 mg  75 mg Oral QHS Toy Baker, MD   75 mg at 07/16/20 2155  . Resource ThickenUp Clear   Oral PRN Aline August, MD      . traMADol Veatrice Bourbon) tablet 50 mg  50 mg Oral Q6H PRN Starla Link, Kshitiz, MD      . vitamin B-12 (CYANOCOBALAMIN) tablet 1,000 mcg  1,000 mcg Oral Daily Thurnell Lose, MD   1,000 mcg at 07/17/20 5456     Discharge Medications: Please see discharge summary for a list of discharge medications.  Relevant Imaging Results:  Relevant Lab Results:  Additional Information [id number]  Trula Ore, LCSWA

## 2020-07-17 NOTE — Consult Note (Signed)
   [facility] Inpatient Consult   07/17/2020  [name] 04-02-1933 [id number]  [place] [facility] [ACO] Patient: Theme park manager Medicare with [place] practice [facility] [[facility]]  Patient was screened for extreme high risk scores for unplanned readmission risk.  Patient will have the transition of care call conducted by the primary care provider. This patient is also in an Embedded practice which has a chronic disease management Embedded Care Management team.  Currently, patient is recommended for SNF and also reviewed Palliative Care consult.  Plan: Will update [facility] Embedded Care Management and made aware of above needs.   Please contact for further questions,  Natividad Brood, RN BSN [facility] Liaison  [phone] business mobile phone Toll free office [phone]  Fax number: [phone] [email or web address]

## 2020-07-17 NOTE — Progress Notes (Signed)
This chaplain responded to PMT consult for notarizing Pt. Advance Directive.  The chaplain, notary and witnesses were present and provided the services necessary for completing the Pt. Advance Directive.  The Pt. named [name] [phone]) as her Healthcare Agent and [name] [phone]) as the person to be the [place] if [name] is unwilling or unable. The chaplain gave the original and copies to the Pt. A copy was scanned to Electronic Medical Records.  This chaplain is available for F/U spiritual care as needed.

## 2020-07-17 NOTE — Progress Notes (Signed)
[place] for apixaban Indication: chest pain/ACS  Allergies  Allergen Reactions  . Aspirin Itching  . Penicillins Itching and Rash    DID THE REACTION INVOLVE: Swelling of the face/tongue/throat, SOB, or low BP? Yes Sudden or severe rash/hives, skin peeling, or the inside of the mouth or nose? No Did it require medical treatment? Yes When did it last happen?"More than 10 years ago" If all above answers are "NO", may proceed with cephalosporin use.     Patient Measurements: Height: 5\' 5"  (165.1 cm) Weight: 86.7 kg (191 lb 2.2 oz) IBW/kg (Calculated) : 57 Heparin Dosing Weight: 76kg  Vital Signs: Temp: 98.2 F (36.8 C) (09/24 0722) Temp Source: Oral (09/24 0722) BP: 148/102 (09/24 0722) Pulse Rate: 71 (09/24 0722)  Labs: Recent Labs    07/14/20 0900 07/14/20 0900 07/15/20 1411 07/16/20 0633  HGB 10.2*   < > 9.8* 8.4*  HCT 34.3*  --  31.6* 27.3*  PLT 116*  --  142* PLATELET CLUMPS NOTED ON SMEAR, UNABLE TO ESTIMATE  APTT 41*  --   --   --   HEPARINUNFRC 0.75*  --   --   --   CREATININE 1.61*  --  1.22* 1.13*   < > = values in this interval not displayed.    Estimated Creatinine Clearance: 39.6 mL/min (A) (by C-G formula based on SCr of 1.13 mg/dL (H)).   Medical History: Past Medical History:  Diagnosis Date  . Abnormal echocardiogram    a. possible mass on echo 05/2019 on PPM, b. no evidence of mass / atrial lead vegetation on follow up echo 06/2019  . Anemia   . Arthritis   . Breast cancer ([place]) 10/2018   Invasive ductal carcinoma  . Cancer ([place])   . Cataract   . Chronic combined systolic and diastolic CHF (congestive heart failure) ([place])    a. Previously diastolic but patient AVS from outside hospital listed systolic CHF and cardiomyopathy, records pending.  . CKD (chronic kidney disease), stage III   . Clotting disorder ([place])   . CVA (cerebral vascular accident) ([place])    residual right sided weakness and mild  dysphagia  . Diabetes mellitus ([place])   . Dizziness and giddiness   . DVT (deep venous thrombosis) (HCC)    a. on anticoagulation for this.  . Essential hypertension   . LBBB (left bundle branch block)   . Mitral regurgitation    a. Mod MR by echo 2014.  . Muscular deconditioning   . Obesity   . Pacemaker   . Pericardial effusion   . SVT (supraventricular tachycardia) ([place])    a. In 2013 she had an EPS with ablation for SVT which did not eliminate the SVT completely. She also had bradycardia which limited medication. She was placed on amiodarone by Dr. Lovena Le.  . Thrombocytopenia ([place]) 11/21/2011    Medications:  Infusions:  . ampicillin-sulbactam (UNASYN) IV 3 g (07/17/20 9242)    Assessment: 87yoF presenting with possible ACS started on IV heparin. Pt quickly transitioned back to home apixaban. PTA dose was 2.5mg  BID due to age >43 and baseline SCr >1.5. SCr has been <1.5 the past two days, but given historical baseline will continue low dose for now and trend SCr. If it remains <1.5 closer to D/C would consider increasing dose.   Goal of Therapy:  Heparin level 0.3-0.7 units/ml aPTT 66-102 seconds Monitor platelets by anticoagulation protocol: Yes   Plan:  Apixaban 2.5mg  BID for now as  above   Arrie Senate, PharmD, BCPS Clinical Pharmacist [phone] Please check AMION for all [place] numbers 07/17/2020

## 2020-07-17 NOTE — Progress Notes (Signed)
  Speech Language Pathology Treatment: Dysphagia  Patient Details Name: [name] MRN: [id number] DOB: [birth year] Today's Date: 07/17/2020 Time: 1572-6203 SLP Time Calculation (min) (ACUTE ONLY): 13 min  Assessment / Plan / Recommendation Clinical Impression  Pt was seen for dysphagia treatment and was cooperative throughout the session. She reported that she has been able to consume the current diet without difficulty but wishes all her meats were not ground since she typically just likes them to be softer. Pt tolerated dysphagia 2 solids, dysphagia 3 solids, regular texture solids, and thin liquids via straw without overt s/sx of aspiration. Mastication was mildly prolonged with regular texture solids due to dental status with only maxillary dentures, but no significant oral residue was noted. Pt's diet will be advanced to dysphagia 3 solids with thin liquids and SLP will follow briefly to ensure tolerance.    HPI HPI: 86yo female admitted 07/13/20. PMH: DM2,  history of breast cancer on anastrozole, SVT status post ablation with permanent pacemaker, chronic combined heart failure, chronic kidney disease stage IIIb, history of a stroke and recurrent DVT on Eliquis, recent COVID infection and PNA. CXR 9/20: Worsening bilateral perihilar interstitial and ground-glass opacities, favor mild edema over progressive pneumonia.      SLP Plan  Continue with current plan of care       Recommendations  Diet recommendations: Dysphagia 3 (mechanical soft);Thin liquid Liquids provided via: Cup;Straw Medication Administration: Whole meds with puree Supervision: Staff to assist with self feeding Compensations: Minimize environmental distractions;Slow rate;Small sips/bites Postural Changes and/or Swallow Maneuvers: Seated upright 90 degrees;Upright 30-60 min after meal                Oral Care Recommendations: Oral care BID Follow up Recommendations: Other (comment) (TBD) SLP Visit Diagnosis:  Dysphagia, unspecified (R13.10) Plan: Continue with current plan of care       [name] I. [place] Negus, [place], [place] Office number [phone] Pager [place] 07/17/2020, 9:24 AM

## 2020-07-17 NOTE — TOC Initial Note (Addendum)
Transition of Care [facility]) - Initial/Assessment Note    Patient Details  Name: [name] MRN: [id number] Date of Birth: Mar 07, 1933  [facility] CM/SW Contact:    Trula Ore, [place] Phone Number: 07/17/2020, 10:23 AM  Clinical Narrative:                     CSW received consult for possible SNF placement at time of discharge. CSW spoke with patients daughter [name] regarding PT recommendation of SNF placement at time of discharge.  Patients daughter [name] expressed understanding of PT recommendation and is agreeable to SNF placement at time of discharge. Patient comes from [facility]. Patients daughter does not want her to return there.Patients daughter request for CSW to fax out initial referral to [place] area. Patient has not received the COVID vaccines.Patient requested to receive COVID vaccine. CSW received MD's approval for patient to receive Covid Vaccine. CSW put in request for patient to receive the Covid vaccine. No further questions reported at this time. CSW to continue to follow and assist with discharge planning needs.     Patient Goals and CMS Choice        Expected Discharge Plan and Services                                                Prior Living Arrangements/Services                       Activities of Daily Living      Permission Sought/Granted                  Emotional Assessment              Admission diagnosis:  Confusion [R41.0] Aspiration pneumonia ([place]) [J69.0] Hypoglycemia [E16.2] Hypoxia [R09.02] Elevated troponin [R77.8] COVID-19 virus infection [U07.1] Acute bronchitis due to COVID-19 virus [U07.1, J20.8] Patient Active Problem List   Diagnosis Date Noted  . Declining functional status   . Goals of care, counseling/discussion   . Palliative care by specialist   . Aspiration pneumonia ([place]) 07/13/2020  . Hypoglycemia 07/13/2020  . Elevated troponin  07/13/2020  . Sepsis ([place]) 07/13/2020  . Syncope and collapse 06/22/2020  . COVID-19 virus infection 06/22/2020  . Hypokalemia 06/22/2020  . Pacemaker 05/06/2020  . Syncope 08/10/2019  . Displaced fracture of lateral malleolus of right fibula, initial encounter for closed fracture 07/10/2019  . Pain in left shoulder 07/10/2019  . Acute CHF (congestive heart failure) ([place]) 06/09/2019  . Acute pulmonary edema (HCC)   . Personal history of breast cancer 04/22/2019  . Pressure ulcer 01/04/2019  . Acute pancreatitis 01/02/2019  . Malignant neoplasm of lower-inner quadrant of right breast of female, estrogen receptor positive ([place]) 11/23/2018  . Hematoma of right iliopsoas muscle 11/06/2018  . Intramuscular hematoma 11/06/2018  . Hypertensive heart disease with heart failure ([place]) 09/05/2017  . Aphasia 08/31/2017  . Dysphagia 08/31/2017  . CKD (chronic kidney disease), stage III ([place])   . LBBB (left bundle branch block)   . Anemia   . HTN (hypertension)   . Obesity   . DVT (deep venous thrombosis) ([place])   . DM (diabetes mellitus) ([place]) 07/28/2015  . Fall 07/28/2015  . Warfarin-induced coagulopathy ([place]) 07/28/2015  . Acute on chronic diastolic CHF (congestive heart failure) ([place]) 03/19/2014  .  Edema 08/06/2013  . SVT (supraventricular tachycardia) ([place]) 10/15/2012  . First degree AV block 10/15/2012  . Acute renal insufficiency 10/14/2012  . Dizziness and giddiness   . Thrombocytopenia ([place]) 11/21/2011   PCP:  [name], MD Pharmacy:  No Pharmacies Listed    Social Determinants of Health (SDOH) Interventions    Readmission Risk Interventions Readmission Risk Prevention Plan 01/03/2019 01/02/2019  Transportation Screening (No Data) Complete  PCP or Specialist Appt within 3-5 Days - Complete  HRI or [place] - Complete  Social Work Consult for [place] Planning/Counseling - Complete  Palliative Care Screening - Not Applicable  Medication Review Human resources officer) - Complete  Some recent data might be hidden

## 2020-07-17 NOTE — Progress Notes (Signed)
Patient ID: [name], female   DOB: 09-May-1933, 86 y.o.   MRN: [id number]  PROGRESS NOTE    CYBILL URIEGAS  OTL:[id number] DOB: [birth year] DOA: 07/13/2020 PCP: [name], MD   Brief Narrative:  86 year old female with history of diabetes mellitus type 2, breast cancer on anastrozole, SVT status post ablation with permanent pacemaker, chronic combined heart failure, chronic kidney disease stage IIIb, unspecified stroke, recurrent DVT on Eliquis, recent COVID-19 pneumonia and discharged to a nursing home presented with very low blood sugars at nursing home along with poor appetite and brief.  Of unresponsiveness.  On presentation, CT of the head was negative for any acute abnormality.  She was noted to be slightly hypoxic with mild fever and there was a suspicion for aspiration pneumonia for which she was started on antibiotics.   Assessment & Plan:   Possible aspiration pneumonia hypoxia -Currently on Unasyn.  will switch to Augmentin once ready to be discharged to complete 7-day course of therapy -Currently on room air. -Diet as per SLP recommendations.  Cultures negative so far -T-max of 100.2 over the last 24 hours.  Acute metabolic encephalopathy -Probably from pneumonia and hypoglycemia. -Mental status improving.  Continue neurochecks.  Fall precautions.  PT recommends SNF placement.  Recent fully treated COVID-19 pneumonia -Patient had a recent hospitalization and was treated for COVID-19 pneumonia. -Incentive spirometry.  Hypertension -Blood pressure stable.  Continue beta-blocker.  Of IV fluids  Diabetes mellitus type 2 uncontrolled with hypoglycemia/hyperglycemia -Presented with hypoglycemia and altered mental status probably from poor oral intake.  A1c 9.0.  Blood sugars are trending upwards while hospitalized.  Started Levemir on 07/13/2020.  Increase Levemir to 15 units nightly.  Continue CBGs with SSI.  Dehydration -Improving.  Off IV fluids.  AKI on CKD stage  IIIb -Baseline creatinine 1.5.  Creatinine 1.13 on 07/16/2020.  Chronic combined CHF -EF of 30% on most recent echocardiogram.  Lasix on hold.  Strict input output.  Daily weights.  No signs of fluid overload -Cardiology evaluation appreciated.  Continue Toprol.  Positive troponins -Probably from demand ischemia.  Cardiology following and recommending no plans for further inpatient ischemic evaluation.  History of DVT -Continue Eliquis  Prior unspecified CVA -Continue statin and Eliquis  History of SVT/pacemaker placement -On amiodarone as an outpatient for possible?  SVT.  Continue amiodarone.  Outpatient follow-up with cardiology  Chronic weakness and deconditioning -Wheelchair-bound at baseline.  Currently from SNF.  Social worker following for placement back to SNF -Palliative care following.  CODE STATUS has been changed to DNR per palliative care team.  DVT prophylaxis: Eliquis Code Status: DNR Family Communication: Spoke to daughter & granddaughter on phone on 07/15/2020 disposition Plan: Status is: Inpatient  Remains inpatient appropriate because:Inpatient level of care appropriate due to severity of illness.  Possible discharge back to SNF if remains fever free for the next 24 hours.   Dispo: The patient is from: SNF              Anticipated d/c is to: SNF              Anticipated d/c date is: 1 day              Patient currently is not medically stable to d/c.   Consultants: Cardiology/palliative care Procedures: None  Antimicrobials:  Anti-infectives (From admission, onward)   Start     Dose/Rate Route Frequency Ordered Stop   07/16/20 0815  Ampicillin-Sulbactam (UNASYN) 3 g in sodium chloride 0.9 %  100 mL IVPB        3 g 200 mL/hr over 30 Minutes Intravenous Every 6 hours 07/16/20 0813     07/14/20 1900  cefTRIAXone (ROCEPHIN) 1 g in sodium chloride 0.9 % 100 mL IVPB  Status:  Discontinued        1 g 200 mL/hr over 30 Minutes Intravenous Every 24 hours  07/13/20 2002 07/14/20 0924   07/14/20 1800  azithromycin (ZITHROMAX) 500 mg in sodium chloride 0.9 % 250 mL IVPB  Status:  Discontinued        500 mg 250 mL/hr over 60 Minutes Intravenous Every 24 hours 07/13/20 2010 07/14/20 0924   07/14/20 1000  Ampicillin-Sulbactam (UNASYN) 3 g in sodium chloride 0.9 % 100 mL IVPB  Status:  Discontinued        3 g 200 mL/hr over 30 Minutes Intravenous Every 12 hours 07/14/20 0928 07/16/20 0813   07/13/20 1815  cefTRIAXone (ROCEPHIN) 1 g in sodium chloride 0.9 % 100 mL IVPB        1 g 200 mL/hr over 30 Minutes Intravenous  Once 07/13/20 1803 07/13/20 2003   07/13/20 1815  azithromycin (ZITHROMAX) 500 mg in sodium chloride 0.9 % 250 mL IVPB        500 mg 250 mL/hr over 60 Minutes Intravenous  Once 07/13/20 1803 07/13/20 2114       Subjective: Patient seen and examined at bedside.  Poor historian.  Complains of abdominal pain, back pain and some diarrhea.  Breathing is better.  Still intermittently coughing.  No overnight vomiting.  Nursing staff reports mushy bowel movement this morning. Objective: Vitals:   07/16/20 2026 07/17/20 0500 07/17/20 0539 07/17/20 0722  BP: (!) 155/77  134/70 (!) 148/102  Pulse: 69  73 71  Resp: (!) 23  (!) 23 18  Temp: 99.4 F (37.4 C)  100.2 F (37.9 C) 98.2 F (36.8 C)  TempSrc: Oral  Oral Oral  SpO2: 94%  96%   Weight:  86.7 kg    Height:        Intake/Output Summary (Last 24 hours) at 07/17/2020 0740 Last data filed at 07/17/2020 0548 Gross per 24 hour  Intake 800 ml  Output 950 ml  Net -150 ml   Filed Weights   07/13/20 0942 07/16/20 0514 07/17/20 0500  Weight: 87 kg 88.8 kg 86.7 kg    Examination:  General exam: No distress.  Currently on room air.  Chronically ill looking female lying on bed. Respiratory system: Bilateral decreased breath sounds at bases with scattered crackles.  No wheezing  cardiovascular system: S1-S2 heard, rate controlled Gastrointestinal system: Abdomen is nondistended,  soft and nontender.  Normal bowel sounds are heard  extremities: No cyanosis or clubbing.  Mild lower extremity edema present Central nervous system: Awake.  Still slow to respond to questions.  No focal neurological deficits.  Moving extremities Skin: No obvious lesions/petechiae  psychiatry: Flat affect.  Poor historian.    Data Reviewed: I have personally reviewed following labs and imaging studies  CBC: Recent Labs  Lab 07/13/20 0934 07/13/20 0943 07/14/20 0900 07/15/20 1411 07/16/20 0633  WBC 4.2  --  7.7 5.7 4.5  NEUTROABS 2.8  --  5.7 4.0 2.6  HGB 9.9* 9.9* 10.2* 9.8* 8.4*  HCT 32.2* 29.0* 34.3* 31.6* 27.3*  MCV 97.9  --  101.2* 99.1 96.8  PLT 111*  --  116* 142* PLATELET CLUMPS NOTED ON SMEAR, UNABLE TO ESTIMATE   Basic Metabolic Panel: Recent Labs  Lab 07/13/20 0934 07/13/20 0934 07/13/20 0943 07/14/20 0020 07/14/20 0900 07/15/20 1411 07/16/20 0633  NA 144   < > 144 141 143 140 141  K 3.1*   < > 3.2* 4.1 4.3 4.7 3.9  CL 104  --   --  106 106 111 107  CO2 29  --   --  25 26 24 24   GLUCOSE 71  --   --  215* 171* 274* 222*  BUN 19  --   --  19 17 13 10   CREATININE 1.71*  --   --  1.67* 1.61* 1.22* 1.13*  CALCIUM 8.3*  --   --  7.9* 8.3* 8.5* 8.1*  MG  --   --   --   --  2.2 2.2 2.2   < > = values in this interval not displayed.   GFR: Estimated Creatinine Clearance: 39.6 mL/min (A) (by C-G formula based on SCr of 1.13 mg/dL (H)). Liver Function Tests: Recent Labs  Lab 07/13/20 0934 07/14/20 0020 07/14/20 0900 07/15/20 1411 07/16/20 0633  AST 27 37 42* 36 28  ALT 22 27 32 31 28  ALKPHOS 62 59 65 64 54  BILITOT 0.5 0.5 0.7 0.6 0.6  PROT 6.5 5.7* 6.6 6.2* 5.3*  ALBUMIN 2.8* 2.3* 2.6* 2.4* 2.0*   No results for input(s): LIPASE, AMYLASE in the last 168 hours. No results for input(s): AMMONIA in the last 168 hours. Coagulation Profile: Recent Labs  Lab 07/13/20 0934  INR 1.1   Cardiac Enzymes: No results for input(s): CKTOTAL, CKMB, CKMBINDEX,  TROPONINI in the last 168 hours. BNP (last 3 results) No results for input(s): PROBNP in the last 8760 hours. HbA1C: Recent Labs    07/14/20 0900  HGBA1C 9.0*   CBG: Recent Labs  Lab 07/16/20 0750 07/16/20 1217 07/16/20 1713 07/16/20 2148 07/17/20 0726  GLUCAP 208* 258* 211* 210* 204*   Lipid Profile: No results for input(s): CHOL, HDL, LDLCALC, TRIG, CHOLHDL, LDLDIRECT in the last 72 hours. Thyroid Function Tests: Recent Labs    07/14/20 0900  TSH 1.099   Anemia Panel: Recent Labs    07/14/20 0900  VITAMINB12 3,092*  FOLATE 19.4  FERRITIN 208  TIBC 262  IRON 20*  RETICCTPCT 2.7   Sepsis Labs: Recent Labs  Lab 07/13/20 1213 07/13/20 1850 07/13/20 2116 07/13/20 2316 07/14/20 0900  PROCALCITON  --   --   --   --  8.15  LATICACIDVEN 1.1 1.7 0.5 1.9  --     Recent Results (from the past 240 hour(s))  SARS Coronavirus 2 by RT PCR (hospital order, performed in [place] hospital lab) Nasopharyngeal Nasopharyngeal Swab     Status: Abnormal   Collection Time: 07/13/20  9:40 AM   Specimen: Nasopharyngeal Swab  Result Value Ref Range Status   SARS Coronavirus 2 POSITIVE (A) NEGATIVE Final    Comment: emailed L. Berdik RN 11:55 07/13/20 ([email or web address]) (NOTE) SARS-CoV-2 target nucleic acids are DETECTED  SARS-CoV-2 RNA is generally detectable in upper respiratory specimens  during the acute phase of infection.  Positive results are indicative  of the presence of the identified virus, but do not rule out bacterial infection or co-infection with other pathogens not detected by the test.  Clinical correlation with patient history and  other diagnostic information is necessary to determine patient infection status.  The expected result is negative.  Fact Sheet for Patients:   StrictlyIdeas.no   Fact Sheet for Healthcare Providers:   BankingDealers.co.za    This  test is not yet approved or cleared by the [place] and  has been authorized for detection and/or diagnosis of SARS-CoV-2 by FDA under an Emergency Use Authorization (EUA).  This EUA will remain in effect (meaning this test can be used) for the duration of  the  COVID-19 declaration under Section 564(b)(1) of the Act, 21 U.S.C. section 360-bbb-3(b)(1), unless the authorization is terminated or revoked sooner.  Performed at [facility], [place] [address], [place], [place] [address]   Urine culture     Status: None   Collection Time: 07/13/20  1:50 PM   Specimen: Urine, Random  Result Value Ref Range Status   Specimen Description URINE, RANDOM  Final   Special Requests NONE  Final   Culture   Final    NO GROWTH Performed at [facility], [place] [address], [place], [place] [address]    Report Status 07/14/2020 FINAL  Final  Blood culture (routine x 2)     Status: None (Preliminary result)   Collection Time: 07/13/20  6:50 PM   Specimen: BLOOD  Result Value Ref Range Status   Specimen Description BLOOD RIGHT ANTECUBITAL  Final   Special Requests   Final    BOTTLES DRAWN AEROBIC AND ANAEROBIC Blood Culture results may not be optimal due to an inadequate volume of blood received in culture bottles   Culture   Final    NO GROWTH 3 DAYS Performed at [facility], [place] [address], [place], [place] [address]    Report Status PENDING  Incomplete  Blood culture (routine x 2)     Status: None (Preliminary result)   Collection Time: 07/13/20  6:54 PM   Specimen: BLOOD  Result Value Ref Range Status   Specimen Description BLOOD LEFT ANTECUBITAL  Final   Special Requests   Final    BOTTLES DRAWN AEROBIC AND ANAEROBIC Blood Culture results may not be optimal due to an inadequate volume of blood received in culture bottles   Culture   Final    NO GROWTH 3 DAYS Performed at [facility], [place] [address] [place], [place] [address]    Report Status PENDING  Incomplete  Culture, sputum-assessment     Status: None    Collection Time: 07/14/20 12:05 AM   Specimen: Sputum  Result Value Ref Range Status   Specimen Description SPUTUM  Final   Special Requests NONE  Final   Sputum evaluation   Final    THIS SPECIMEN IS ACCEPTABLE FOR SPUTUM CULTURE Performed at [facility], [address], [place], [place] [address]    Report Status 07/14/2020 FINAL  Final  Culture, respiratory     Status: None   Collection Time: 07/14/20 12:05 AM   Specimen: SPU  Result Value Ref Range Status   Specimen Description SPUTUM  Final   Special Requests NONE Reflexed from [id number]  Final   Gram Stain   Final    RARE WBC PRESENT,BOTH PMN AND MONONUCLEAR MODERATE GRAM POSITIVE COCCI IN PAIRS IN CLUSTERS    Culture   Final    RARE Normal respiratory flora-no Staph aureus or Pseudomonas seen Performed at [facility], [place] [address], [place],  [address]    Report Status 07/16/2020 FINAL  Final  MRSA PCR Screening     Status: None   Collection Time: 07/14/20  9:25 AM   Specimen: Nasal Mucosa; Nasopharyngeal  Result Value Ref Range Status   MRSA  by PCR NEGATIVE NEGATIVE Final    Comment:        The GeneXpert MRSA Assay (FDA approved for NASAL specimens only), is one component of a comprehensive MRSA colonization surveillance program. It is not intended to diagnose MRSA infection nor to guide or monitor treatment for MRSA infections. Performed at [facility], [place] [address], [place], [place] [address]          Radiology Studies: No results found.      Scheduled Meds: . amiodarone  200 mg Oral Daily  . anastrozole  1 mg Oral Daily  . apixaban  2.5 mg Oral BID  . atorvastatin  20 mg Oral QHS  . cholecalciferol  1,000 Units Oral Daily  . feeding supplement (GLUCERNA SHAKE)  237 mL Oral BID BM  . ferrous sulfate  325 mg Oral Q breakfast  . insulin aspart  0-9 Units Subcutaneous TID WC  . insulin detemir  10 Units Subcutaneous QHS  . metoprolol succinate  25 mg Oral Daily  .  pantoprazole  40 mg Oral Daily  . pregabalin  75 mg Oral QHS  . vitamin B-12  1,000 mcg Oral Daily   Continuous Infusions: . ampicillin-sulbactam (UNASYN) IV 3 g (07/17/20 2130)          Aline August, MD [facility] 07/17/2020, 7:40 AM

## 2020-07-18 ENCOUNTER — Inpatient Hospital Stay (HOSPITAL_COMMUNITY): Payer: Medicare Other

## 2020-07-18 LAB — COMPREHENSIVE METABOLIC PANEL
ALT: 27 U/L (ref 0–44)
AST: 25 U/L (ref 15–41)
Albumin: 2.2 g/dL — ABNORMAL LOW (ref 3.5–5.0)
Alkaline Phosphatase: 65 U/L (ref 38–126)
Anion gap: 10 (ref 5–15)
BUN: 6 mg/dL — ABNORMAL LOW (ref 8–23)
CO2: 25 mmol/L (ref 22–32)
Calcium: 8.5 mg/dL — ABNORMAL LOW (ref 8.9–10.3)
Chloride: 107 mmol/L (ref 98–111)
Creatinine, Ser: 1.07 mg/dL — ABNORMAL HIGH (ref 0.44–1.00)
GFR calc Af Amer: 55 mL/min — ABNORMAL LOW (ref >60)
GFR calc non Af Amer: 47 mL/min — ABNORMAL LOW (ref >60)
Glucose, Bld: 151 mg/dL — ABNORMAL HIGH (ref 70–99)
Potassium: 4.3 mmol/L (ref 3.5–5.1)
Sodium: 142 mmol/L (ref 135–145)
Total Bilirubin: 0.5 mg/dL (ref 0.3–1.2)
Total Protein: 5.9 g/dL — ABNORMAL LOW (ref 6.5–8.1)

## 2020-07-18 LAB — CULTURE, BLOOD (ROUTINE X 2)
Culture: NO GROWTH
Culture: NO GROWTH

## 2020-07-18 LAB — CBC WITH DIFFERENTIAL/PLATELET
Abs Immature Granulocytes: 0.03 10*3/uL (ref 0.00–0.07)
Basophils Absolute: 0 10*3/uL (ref 0.0–0.1)
Basophils Relative: 1 %
Eosinophils Absolute: 0.6 10*3/uL — ABNORMAL HIGH (ref 0.0–0.5)
Eosinophils Relative: 13 %
HCT: 30.9 % — ABNORMAL LOW (ref 36.0–46.0)
Hemoglobin: 9.5 g/dL — ABNORMAL LOW (ref 12.0–15.0)
Immature Granulocytes: 1 %
Lymphocytes Relative: 22 %
Lymphs Abs: 1 10*3/uL (ref 0.7–4.0)
MCH: 29.9 pg (ref 26.0–34.0)
MCHC: 30.7 g/dL (ref 30.0–36.0)
MCV: 97.2 fL (ref 80.0–100.0)
Monocytes Absolute: 0.6 10*3/uL (ref 0.1–1.0)
Monocytes Relative: 12 %
Neutro Abs: 2.3 10*3/uL (ref 1.7–7.7)
Neutrophils Relative %: 51 %
Platelets: 197 10*3/uL (ref 150–400)
RBC: 3.18 MIL/uL — ABNORMAL LOW (ref 3.87–5.11)
RDW: 14.2 % (ref 11.5–15.5)
WBC: 4.6 10*3/uL (ref 4.0–10.5)
nRBC: 0 % (ref 0.0–0.2)

## 2020-07-18 LAB — GLUCOSE, CAPILLARY
Glucose-Capillary: 73 mg/dL (ref 70–99)
Glucose-Capillary: 176 mg/dL — ABNORMAL HIGH (ref 70–99)
Glucose-Capillary: 246 mg/dL — ABNORMAL HIGH (ref 70–99)
Glucose-Capillary: 298 mg/dL — ABNORMAL HIGH (ref 70–99)

## 2020-07-18 LAB — C-REACTIVE PROTEIN: CRP: 4.2 mg/dL — ABNORMAL HIGH (ref <1.0)

## 2020-07-18 LAB — MAGNESIUM: Magnesium: 2.1 mg/dL (ref 1.7–2.4)

## 2020-07-18 MED ORDER — TORSEMIDE 20 MG PO TABS
20.0000 mg | ORAL_TABLET | Freq: Every day | ORAL | Status: DC
  Administered 2020-07-18 – 2020-07-20 (×3): 20 mg via ORAL
  Filled 2020-07-18 (×3): qty 1

## 2020-07-18 MED ORDER — INSULIN ASPART 100 UNIT/ML ~~LOC~~ SOLN
2.0000 [IU] | Freq: Once | SUBCUTANEOUS | Status: AC
  Administered 2020-07-18: 2 [IU] via SUBCUTANEOUS

## 2020-07-18 MED ORDER — ALUM & MAG HYDROXIDE-SIMETH 200-200-20 MG/5ML PO SUSP: 15.0000 mL | Freq: Four times a day (QID) | ORAL | Status: DC | PRN

## 2020-07-18 MED ORDER — GUAIFENESIN-DM 100-10 MG/5ML PO SYRP
10.0000 mL | ORAL_SOLUTION | ORAL | Status: DC | PRN
  Administered 2020-07-18: 10 mL via ORAL
  Filled 2020-07-18: qty 10

## 2020-07-18 NOTE — Progress Notes (Signed)
PT Cancellation Note  Patient Details Name: [name] MRN: [id number] DOB: 02-Jul-1933   Cancelled Treatment:    Reason Eval/Treat Not Completed: Patient declined, no reason specified. Pt reports she is too fatigued from not sleeping well and just returned to bed. She does not wish to participate with therapy today. Will check back as time allows.   Benjiman Core, PTA Pager [phone] Acute Rehab  Allena Katz 07/18/2020, 3:16 PM

## 2020-07-18 NOTE — Progress Notes (Signed)
Patient ID: [name], female   DOB: 06-10-1933, 86 y.o.   MRN: [id number]  PROGRESS NOTE    [name]  LYY:[id number] DOB: 1933-06-22 DOA: 07/13/2020 PCP: [name], MD   Brief Narrative:  86 year old female with history of diabetes mellitus type 2, breast cancer on anastrozole, SVT status post ablation with permanent pacemaker, chronic combined heart failure, chronic kidney disease stage IIIb, unspecified stroke, recurrent DVT on Eliquis, recent COVID-19 pneumonia and discharged to a nursing home presented with very low blood sugars at nursing home along with poor appetite and brief.  Of unresponsiveness.  On presentation, CT of the head was negative for any acute abnormality.  She was noted to be slightly hypoxic with mild fever and there was a suspicion for aspiration pneumonia for which she was started on antibiotics.   Assessment & Plan:   Possible aspiration pneumonia hypoxia -Currently on Unasyn.  will switch to Augmentin once ready to be discharged to complete 7-day course of therapy -Currently on room air. -Diet as per SLP recommendations.  Cultures negative so far -Afebrile over the last 24 hours.  Acute metabolic encephalopathy -Probably from pneumonia and hypoglycemia. -Mental status improving.  Continue neurochecks.  Fall precautions.  PT recommends SNF placement.  Recent fully treated COVID-19 pneumonia -Patient had a recent hospitalization and was treated for COVID-19 pneumonia. -Incentive spirometry.  Hypertension -Blood pressure stable.  Continue beta-blocker.  Of IV fluids  Diabetes mellitus type 2 uncontrolled with hypoglycemia/hyperglycemia -Presented with hypoglycemia and altered mental status probably from poor oral intake.  A1c 9.0.  Blood sugars are still fluctuating.  Started Levemir on 07/13/2020.  Decrease Levemir to 10 units nightly.  Continue CBGs with SSI.  Dehydration -Improving.  Off IV fluids.  AKI on CKD stage IIIb -Baseline creatinine  1.5.  Creatinine 1.07 today.  Chronic combined CHF -EF of 30% on most recent echocardiogram.  Lasix on hold.  Strict input output.  Daily weights.  No signs of fluid overload -Cardiology evaluation appreciated.  Continue Toprol.  Positive troponins -Probably from demand ischemia.  Cardiology following and recommending no plans for further inpatient ischemic evaluation.  History of DVT -Continue Eliquis  Prior unspecified CVA -Continue statin and Eliquis  History of SVT/pacemaker placement -On amiodarone as an outpatient for possible?  SVT.  Continue amiodarone.  Outpatient follow-up with cardiology  Chronic weakness and deconditioning -Wheelchair-bound at baseline.  Currently from SNF.  Social worker following for placement back to SNF -Palliative care following.  CODE STATUS has been changed to DNR per palliative care team.  DVT prophylaxis: Eliquis Code Status: DNR Family Communication: Spoke to daughter & granddaughter on phone on 07/15/2020 disposition Plan: Status is: Inpatient  Remains inpatient appropriate because:Inpatient level of care appropriate due to severity of illness.  Possible discharge back to SNF if patient feels better over the next 24 hours.   Dispo: The patient is from: SNF              Anticipated d/c is to: SNF              Anticipated d/c date is: 1 day              Patient currently is not medically stable to d/c.   Consultants: Cardiology/palliative care Procedures: None  Antimicrobials:  Anti-infectives (From admission, onward)   Start     Dose/Rate Route Frequency Ordered Stop   07/16/20 0815  Ampicillin-Sulbactam (UNASYN) 3 g in sodium chloride 0.9 % 100 mL IVPB  3 g 200 mL/hr over 30 Minutes Intravenous Every 6 hours 07/16/20 0813     07/14/20 1900  cefTRIAXone (ROCEPHIN) 1 g in sodium chloride 0.9 % 100 mL IVPB  Status:  Discontinued        1 g 200 mL/hr over 30 Minutes Intravenous Every 24 hours 07/13/20 2002 07/14/20 0924    07/14/20 1800  azithromycin (ZITHROMAX) 500 mg in sodium chloride 0.9 % 250 mL IVPB  Status:  Discontinued        500 mg 250 mL/hr over 60 Minutes Intravenous Every 24 hours 07/13/20 2010 07/14/20 0924   07/14/20 1000  Ampicillin-Sulbactam (UNASYN) 3 g in sodium chloride 0.9 % 100 mL IVPB  Status:  Discontinued        3 g 200 mL/hr over 30 Minutes Intravenous Every 12 hours 07/14/20 0928 07/16/20 0813   07/13/20 1815  cefTRIAXone (ROCEPHIN) 1 g in sodium chloride 0.9 % 100 mL IVPB        1 g 200 mL/hr over 30 Minutes Intravenous  Once 07/13/20 1803 07/13/20 2003   07/13/20 1815  azithromycin (ZITHROMAX) 500 mg in sodium chloride 0.9 % 250 mL IVPB        500 mg 250 mL/hr over 60 Minutes Intravenous  Once 07/13/20 1803 07/13/20 2114       Subjective: Patient seen and examined at bedside.  Poor historian.  Does not feel well and complains of having a rough night with diarrhea and cough.  Feels very weak.  Does not feel ready to be discharged today.  No overnight fever or vomiting reported. Objective: Vitals:   07/17/20 1922 07/18/20 0022 07/18/20 0504 07/18/20 0726  BP: (!) 152/81 140/77 (!) 155/88 (!) 175/90  Pulse:  75  85  Resp:  18  (!) 23  Temp: 98.5 F (36.9 C) 98 F (36.7 C) 98 F (36.7 C) 97.9 F (36.6 C)  TempSrc: Oral Oral Oral Oral  SpO2:    97%  Weight:   88.8 kg   Height:        Intake/Output Summary (Last 24 hours) at 07/18/2020 0941 Last data filed at 07/18/2020 0500 Gross per 24 hour  Intake --  Output 350 ml  Net -350 ml   Filed Weights   07/16/20 0514 07/17/20 0500 07/18/20 0504  Weight: 88.8 kg 86.7 kg 88.8 kg    Examination:  General exam: In mild distress and complains of not feeling well due to diarrhea and cough.  Currently on room air.  Chronically ill looking female lying on bed. Respiratory system: Bilateral decreased breath sounds at bases with some crackles but no wheezing.  Intermittently tachypneic  cardiovascular system: Rate controlled,  S1-S2 heard Gastrointestinal system: Abdomen is nondistended, soft and nontender.  Bowel sounds are heard  extremities: Trace lower extremity edema present.  No clubbing Central nervous system: Awake.  Poor historian.  Responding to questions slowly.  No focal neurological deficits.  Moves extremities Skin: No obvious lesions/ecchymosis  psychiatry: Has extremely flat affect.  Looks anxious intermittently    Data Reviewed: I have personally reviewed following labs and imaging studies  CBC: Recent Labs  Lab 07/14/20 0900 07/15/20 1411 07/16/20 0633 07/17/20 0848 07/18/20 0802  WBC 7.7 5.7 4.5 3.8* 4.6  NEUTROABS 5.7 4.0 2.6 2.1 2.3  HGB 10.2* 9.8* 8.4* 8.7* 9.5*  HCT 34.3* 31.6* 27.3* 28.7* 30.9*  MCV 101.2* 99.1 96.8 97.0 97.2  PLT 116* 142* PLATELET CLUMPS NOTED ON SMEAR, UNABLE TO ESTIMATE 143* 197   Basic  Metabolic Panel: Recent Labs  Lab 07/14/20 0900 07/15/20 1411 07/16/20 0633 07/17/20 0848 07/18/20 0802  NA 143 140 141 141 142  K 4.3 4.7 3.9 4.2 4.3  CL 106 111 107 107 107  CO2 26 24 24  21* 25  GLUCOSE 171* 274* 222* 182* 151*  BUN 17 13 10  7* 6*  CREATININE 1.61* 1.22* 1.13* 1.08* 1.07*  CALCIUM 8.3* 8.5* 8.1* 8.2* 8.5*  MG 2.2 2.2 2.2 2.1 2.1   GFR: Estimated Creatinine Clearance: 42.3 mL/min (A) (by C-G formula based on SCr of 1.07 mg/dL (H)). Liver Function Tests: Recent Labs  Lab 07/14/20 0900 07/15/20 1411 07/16/20 0633 07/17/20 0848 07/18/20 0802  AST 42* 36 28 31 25   ALT 32 31 28 30 27   ALKPHOS 65 64 54 55 65  BILITOT 0.7 0.6 0.6 0.4 0.5  PROT 6.6 6.2* 5.3* 5.7* 5.9*  ALBUMIN 2.6* 2.4* 2.0* 2.2* 2.2*   No results for input(s): LIPASE, AMYLASE in the last 168 hours. No results for input(s): AMMONIA in the last 168 hours. Coagulation Profile: Recent Labs  Lab 07/13/20 0934  INR 1.1   Cardiac Enzymes: No results for input(s): CKTOTAL, CKMB, CKMBINDEX, TROPONINI in the last 168 hours. BNP (last 3 results) No results for input(s):  PROBNP in the last 8760 hours. HbA1C: No results for input(s): HGBA1C in the last 72 hours. CBG: Recent Labs  Lab 07/17/20 0726 07/17/20 1112 07/17/20 1608 07/17/20 2039 07/18/20 0725  GLUCAP 204* 171* 150* 223* 73   Lipid Profile: No results for input(s): CHOL, HDL, LDLCALC, TRIG, CHOLHDL, LDLDIRECT in the last 72 hours. Thyroid Function Tests: No results for input(s): TSH, T4TOTAL, FREET4, T3FREE, THYROIDAB in the last 72 hours. Anemia Panel: No results for input(s): VITAMINB12, FOLATE, FERRITIN, TIBC, IRON, RETICCTPCT in the last 72 hours. Sepsis Labs: Recent Labs  Lab 07/13/20 1213 07/13/20 1850 07/13/20 2116 07/13/20 2316 07/14/20 0900  PROCALCITON  --   --   --   --  8.15  LATICACIDVEN 1.1 1.7 0.5 1.9  --     Recent Results (from the past 240 hour(s))  SARS Coronavirus 2 by RT PCR (hospital order, performed in [facility] hospital lab) Nasopharyngeal Nasopharyngeal Swab     Status: Abnormal   Collection Time: 07/13/20  9:40 AM   Specimen: Nasopharyngeal Swab  Result Value Ref Range Status   SARS Coronavirus 2 POSITIVE (A) NEGATIVE Final    Comment: emailed L. Berdik RN 11:55 07/13/20 ([email or web address]) (NOTE) SARS-CoV-2 target nucleic acids are DETECTED  SARS-CoV-2 RNA is generally detectable in upper respiratory specimens  during the acute phase of infection.  Positive results are indicative  of the presence of the identified virus, but do not rule out bacterial infection or co-infection with other pathogens not detected by the test.  Clinical correlation with patient history and  other diagnostic information is necessary to determine patient infection status.  The expected result is negative.  Fact Sheet for Patients:   StrictlyIdeas.no   Fact Sheet for Healthcare Providers:   BankingDealers.co.za    This test is not yet approved or cleared by the [place] FDA and  has been authorized for detection and/or  diagnosis of SARS-CoV-2 by FDA under an Emergency Use Authorization (EUA).  This EUA will remain in effect (meaning this test can be used) for the duration of  the  COVID-19 declaration under Section 564(b)(1) of the Act, 21 U.S.C. section 360-bbb-3(b)(1), unless the authorization is terminated or revoked sooner.  Performed at [facility], [address], [place], [place] [address]   Urine culture     Status: None   Collection Time: 07/13/20  1:50 PM   Specimen: Urine, Random  Result Value Ref Range Status   Specimen Description URINE, RANDOM  Final   Special Requests NONE  Final   Culture   Final    NO GROWTH Performed at [facility], [place] [address], [place], [place] [address]    Report Status 07/14/2020 FINAL  Final  Blood culture (routine x 2)     Status: None (Preliminary result)   Collection Time: 07/13/20  6:50 PM   Specimen: BLOOD  Result Value Ref Range Status   Specimen Description BLOOD RIGHT ANTECUBITAL  Final   Special Requests   Final    BOTTLES DRAWN AEROBIC AND ANAEROBIC Blood Culture results may not be optimal due to an inadequate volume of blood received in culture bottles   Culture   Final    NO GROWTH 3 DAYS Performed at [facility], [place] [address] [place], [place] [address]    Report Status PENDING  Incomplete  Blood culture (routine x 2)     Status: None (Preliminary result)   Collection Time: 07/13/20  6:54 PM   Specimen: BLOOD  Result Value Ref Range Status   Specimen Description BLOOD LEFT ANTECUBITAL  Final   Special Requests   Final    BOTTLES DRAWN AEROBIC AND ANAEROBIC Blood Culture results may not be optimal due to an inadequate volume of blood received in culture bottles   Culture   Final    NO GROWTH 3 DAYS Performed at [facility], [place] [address] [place], [place] [address]    Report Status PENDING  Incomplete  Culture, sputum-assessment     Status: None   Collection Time: 07/14/20 12:05 AM   Specimen: Sputum   Result Value Ref Range Status   Specimen Description SPUTUM  Final   Special Requests NONE  Final   Sputum evaluation   Final    THIS SPECIMEN IS ACCEPTABLE FOR SPUTUM CULTURE Performed at [facility], [address] [place], [place] [address]    Report Status 07/14/2020 FINAL  Final  Culture, respiratory     Status: None   Collection Time: 07/14/20 12:05 AM   Specimen: SPU  Result Value Ref Range Status   Specimen Description SPUTUM  Final   Special Requests NONE Reflexed from [id number]  Final   Gram Stain   Final    RARE WBC PRESENT,BOTH PMN AND MONONUCLEAR MODERATE GRAM POSITIVE COCCI IN PAIRS IN CLUSTERS    Culture   Final    RARE Normal respiratory flora-no Staph aureus or Pseudomonas seen Performed at [facility], [place] [address] [place], [place] [address]    Report Status 07/16/2020 FINAL  Final  MRSA PCR Screening     Status: None   Collection Time: 07/14/20  9:25 AM   Specimen: Nasal Mucosa; Nasopharyngeal  Result Value Ref Range Status   MRSA by PCR NEGATIVE NEGATIVE Final    Comment:        The GeneXpert MRSA Assay (FDA approved for NASAL specimens only), is one component of a comprehensive MRSA colonization surveillance program. It is not intended to diagnose MRSA infection nor to guide or monitor treatment for MRSA infections. Performed at [facility], [place] [address] [place],  [address]   Respiratory Panel by RT PCR (Flu A&B, Covid) - Nasopharyngeal  Swab     Status: None   Collection Time: 07/17/20 12:04 PM   Specimen: Nasopharyngeal Swab  Result Value Ref Range Status   SARS Coronavirus 2 by RT PCR NEGATIVE NEGATIVE Final    Comment: (NOTE) SARS-CoV-2 target nucleic acids are NOT DETECTED.  The SARS-CoV-2 RNA is generally detectable in upper respiratoy specimens during the acute phase of infection. The lowest concentration of SARS-CoV-2 viral copies this assay can detect is 131 copies/mL. A negative result does not preclude  SARS-Cov-2 infection and should not be used as the sole basis for treatment or other patient management decisions. A negative result may occur with  improper specimen collection/handling, submission of specimen other than nasopharyngeal swab, presence of viral mutation(s) within the areas targeted by this assay, and inadequate number of viral copies (<131 copies/mL). A negative result must be combined with clinical observations, patient history, and epidemiological information. The expected result is Negative.  Fact Sheet for Patients:  PinkCheek.be  Fact Sheet for Healthcare Providers:  GravelBags.it  This test is no t yet approved or cleared by the [place] FDA and  has been authorized for detection and/or diagnosis of SARS-CoV-2 by FDA under an Emergency Use Authorization (EUA). This EUA will remain  in effect (meaning this test can be used) for the duration of the COVID-19 declaration under Section 564(b)(1) of the Act, 21 U.S.C. section 360bbb-3(b)(1), unless the authorization is terminated or revoked sooner.     Influenza A by PCR NEGATIVE NEGATIVE Final   Influenza B by PCR NEGATIVE NEGATIVE Final    Comment: (NOTE) The Xpert Xpress SARS-CoV-2/FLU/RSV assay is intended as an aid in  the diagnosis of influenza from Nasopharyngeal swab specimens and  should not be used as a sole basis for treatment. Nasal washings and  aspirates are unacceptable for Xpert Xpress SARS-CoV-2/FLU/RSV  testing.  Fact Sheet for Patients: PinkCheek.be  Fact Sheet for Healthcare Providers: GravelBags.it  This test is not yet approved or cleared by the [place] FDA and  has been authorized for detection and/or diagnosis of SARS-CoV-2 by  FDA under an Emergency Use Authorization (EUA). This EUA will remain  in effect (meaning this test can be used) for the duration of the   Covid-19 declaration under Section 564(b)(1) of the Act, 21  U.S.C. section 360bbb-3(b)(1), unless the authorization is  terminated or revoked. Performed at [facility], [place] [address] [place], [place] [address]          Radiology Studies: DG Abd 2 Views  Result Date: 07/17/2020 CLINICAL DATA:  Abdominal pain. EXAM: X-RAY ABDOMEN 3 VIEWS COMPARISON:  None. FINDINGS: The bowel gas pattern is normal. There is no evidence of free air. No radio-opaque calculi or other significant radiographic abnormality is seen. IMPRESSION: Negative. Electronically Signed   By: Marijo Conception M.D.   On: 07/17/2020 10:29        Scheduled Meds:  amiodarone  200 mg Oral Daily   anastrozole  1 mg Oral Daily   apixaban  2.5 mg Oral BID   atorvastatin  20 mg Oral QHS   cholecalciferol  1,000 Units Oral Daily   feeding supplement (GLUCERNA SHAKE)  237 mL Oral BID BM   ferrous sulfate  325 mg Oral Q breakfast   insulin aspart  0-9 Units Subcutaneous TID WC   insulin detemir  15 Units Subcutaneous QHS   metoprolol succinate  25 mg Oral Daily   pantoprazole  40 mg Oral Daily   pregabalin  75 mg Oral QHS   vitamin B-12  1,000 mcg Oral Daily   Continuous Infusions:  ampicillin-sulbactam (UNASYN) IV 3 g (07/18/20 0940)          Stephanie August, MD [facility] 07/18/2020, 9:41 AM

## 2020-07-18 NOTE — Progress Notes (Signed)
Pharmacist Heart Failure Core Measure Documentation  Assessment: [name] has an EF documented as 30% on 8/31 by ECHO.  Rationale: Heart failure patients with left ventricular systolic dysfunction (LVSD) and an EF < 40% should be prescribed an angiotensin converting enzyme inhibitor (ACEI) or angiotensin receptor blocker (ARB) at discharge unless a contraindication is documented in the medical record.  This patient is not currently on an ACEI or ARB for HF.  This note is being placed in the record in order to provide documentation that a contraindication to the use of these agents is present for this encounter.  ACE Inhibitor or Angiotensin Receptor Blocker is contraindicated (specify all that apply)  []   ACEI allergy AND ARB allergy []   Angioedema []   Moderate or severe aortic stenosis []   Hyperkalemia []   Hypotension []   Renal artery stenosis [x]   Worsening renal function, preexisting renal disease or dysfunction - pt with historical serum creatinine <1.5 and fluctuating serum creatinine. Discussed with provider, the patient is not an appropriate candidate or an ACE-I or ARB.   Per the provider, the patient will be reassessed in the outpatient setting for appropriateness of an ACE-I or ARB.  Shauna Hugh, PharmD, [place]  PGY-1 Pharmacy Resident 07/18/2020 8:42 AM  Please check [email or web address] for unit-specific pharmacy phone numbers.

## 2020-07-19 ENCOUNTER — Encounter (HOSPITAL_COMMUNITY): Payer: Self-pay | Admitting: Internal Medicine

## 2020-07-19 LAB — CBC WITH DIFFERENTIAL/PLATELET
Abs Immature Granulocytes: 0.02 10*3/uL (ref 0.00–0.07)
Basophils Absolute: 0 10*3/uL (ref 0.0–0.1)
Basophils Relative: 1 %
Eosinophils Absolute: 0.5 10*3/uL (ref 0.0–0.5)
Eosinophils Relative: 11 %
HCT: 28.1 % — ABNORMAL LOW (ref 36.0–46.0)
Hemoglobin: 8.7 g/dL — ABNORMAL LOW (ref 12.0–15.0)
Immature Granulocytes: 1 %
Lymphocytes Relative: 24 %
Lymphs Abs: 1 10*3/uL (ref 0.7–4.0)
MCH: 29.7 pg (ref 26.0–34.0)
MCHC: 31 g/dL (ref 30.0–36.0)
MCV: 95.9 fL (ref 80.0–100.0)
Monocytes Absolute: 0.6 10*3/uL (ref 0.1–1.0)
Monocytes Relative: 14 %
Neutro Abs: 2.1 10*3/uL (ref 1.7–7.7)
Neutrophils Relative %: 49 %
Platelets: 204 10*3/uL (ref 150–400)
RBC: 2.93 MIL/uL — ABNORMAL LOW (ref 3.87–5.11)
RDW: 14.3 % (ref 11.5–15.5)
WBC: 4.2 10*3/uL (ref 4.0–10.5)
nRBC: 0 % (ref 0.0–0.2)

## 2020-07-19 LAB — GLUCOSE, CAPILLARY
Glucose-Capillary: 87 mg/dL (ref 70–99)
Glucose-Capillary: 144 mg/dL — ABNORMAL HIGH (ref 70–99)
Glucose-Capillary: 187 mg/dL — ABNORMAL HIGH (ref 70–99)
Glucose-Capillary: 275 mg/dL — ABNORMAL HIGH (ref 70–99)

## 2020-07-19 LAB — COMPREHENSIVE METABOLIC PANEL
ALT: 23 U/L (ref 0–44)
AST: 22 U/L (ref 15–41)
Albumin: 2.1 g/dL — ABNORMAL LOW (ref 3.5–5.0)
Alkaline Phosphatase: 58 U/L (ref 38–126)
Anion gap: 9 (ref 5–15)
BUN: 9 mg/dL (ref 8–23)
CO2: 27 mmol/L (ref 22–32)
Calcium: 8.1 mg/dL — ABNORMAL LOW (ref 8.9–10.3)
Chloride: 107 mmol/L (ref 98–111)
Creatinine, Ser: 1.23 mg/dL — ABNORMAL HIGH (ref 0.44–1.00)
GFR calc Af Amer: 46 mL/min — ABNORMAL LOW (ref >60)
GFR calc non Af Amer: 40 mL/min — ABNORMAL LOW (ref >60)
Glucose, Bld: 149 mg/dL — ABNORMAL HIGH (ref 70–99)
Potassium: 3.8 mmol/L (ref 3.5–5.1)
Sodium: 143 mmol/L (ref 135–145)
Total Bilirubin: 0.4 mg/dL (ref 0.3–1.2)
Total Protein: 5.6 g/dL — ABNORMAL LOW (ref 6.5–8.1)

## 2020-07-19 LAB — MAGNESIUM: Magnesium: 1.8 mg/dL (ref 1.7–2.4)

## 2020-07-19 NOTE — Progress Notes (Signed)
Patient ID: [name], female   DOB: 05/24/1933, 86 y.o.   MRN: [id number]  PROGRESS NOTE    [name]  AOZ:[id number] DOB: 06-19-1933 DOA: 07/13/2020 PCP: [name], MD   Brief Narrative:  86 year old female with history of diabetes mellitus type 2, breast cancer on anastrozole, SVT status post ablation with permanent pacemaker, chronic combined heart failure, chronic kidney disease stage IIIb, unspecified stroke, recurrent DVT on Eliquis, recent COVID-19 pneumonia and discharged to a nursing home presented with very low blood sugars at nursing home along with poor appetite and brief.  Of unresponsiveness.  On presentation, CT of the head was negative for any acute abnormality.  She was noted to be slightly hypoxic with mild fever and there was a suspicion for aspiration pneumonia for which she was started on antibiotics.   Assessment & Plan:   Possible aspiration pneumonia hypoxia -Currently on Unasyn.  will switch to Augmentin once ready to be discharged to complete 7-day course of therapy -Currently on room air. -Diet as per SLP recommendations.  Cultures negative so far -Afebrile over the last 48 hours.  Acute metabolic encephalopathy -Probably from pneumonia and hypoglycemia. -Mental status improving.  Continue neurochecks.  Fall precautions.  PT recommends SNF placement.  Recent fully treated COVID-19 pneumonia -Patient had a recent hospitalization and was treated for COVID-19 pneumonia. -Incentive spirometry.  Hypertension -Blood pressure stable.  Continue beta-blocker.  Of IV fluids  Diabetes mellitus type 2 uncontrolled with hypoglycemia/hyperglycemia -Presented with hypoglycemia and altered mental status probably from poor oral intake.  A1c 9.0.  Blood sugars are still fluctuating.  Started Levemir on 07/13/2020.  Continue Levemir 15 units nightly..  Continue CBGs with SSI.  Dehydration -Improving.  Off IV fluids.  AKI on CKD stage IIIb -Baseline creatinine  1.5.  Creatinine 1.23 today.  Chronic combined CHF -EF of 30% on most recent echocardiogram. Strict input output.  Daily weights.  No signs of fluid overload -Cardiology evaluation appreciated.  Continue Toprol. -Torsemide resumed on 07/18/2020 at 20 mg daily.  Positive troponins -Probably from demand ischemia.  Cardiology following and recommending no plans for further inpatient ischemic evaluation.  History of DVT -Continue Eliquis  Prior unspecified CVA -Continue statin and Eliquis  History of SVT/pacemaker placement -On amiodarone as an outpatient for possible?  SVT.  Continue amiodarone.  Outpatient follow-up with cardiology  Chronic weakness and deconditioning -Wheelchair-bound at baseline.  Currently from SNF.  Social worker following for placement to SNF -Palliative care following.  CODE STATUS has been changed to DNR per palliative care team.  DVT prophylaxis: Eliquis Code Status: DNR Family Communication: Spoke to daughter & granddaughter on phone on 07/15/2020 disposition Plan: Status is: Inpatient  Remains inpatient appropriate because:Inpatient level of care appropriate due to severity of illness.     Dispo: The patient is from: SNF              Anticipated d/c is to: SNF              Anticipated d/c date is: 1 day              Patient currently is medically stable to d/c.   Consultants: Cardiology/palliative care Procedures: None  Antimicrobials:  Anti-infectives (From admission, onward)   Start     Dose/Rate Route Frequency Ordered Stop   07/16/20 0815  Ampicillin-Sulbactam (UNASYN) 3 g in sodium chloride 0.9 % 100 mL IVPB        3 g 200 mL/hr over 30 Minutes Intravenous  Every 6 hours 07/16/20 0813     07/14/20 1900  cefTRIAXone (ROCEPHIN) 1 g in sodium chloride 0.9 % 100 mL IVPB  Status:  Discontinued        1 g 200 mL/hr over 30 Minutes Intravenous Every 24 hours 07/13/20 2002 07/14/20 0924   07/14/20 1800  azithromycin (ZITHROMAX) 500 mg in sodium  chloride 0.9 % 250 mL IVPB  Status:  Discontinued        500 mg 250 mL/hr over 60 Minutes Intravenous Every 24 hours 07/13/20 2010 07/14/20 0924   07/14/20 1000  Ampicillin-Sulbactam (UNASYN) 3 g in sodium chloride 0.9 % 100 mL IVPB  Status:  Discontinued        3 g 200 mL/hr over 30 Minutes Intravenous Every 12 hours 07/14/20 0928 07/16/20 0813   07/13/20 1815  cefTRIAXone (ROCEPHIN) 1 g in sodium chloride 0.9 % 100 mL IVPB        1 g 200 mL/hr over 30 Minutes Intravenous  Once 07/13/20 1803 07/13/20 2003   07/13/20 1815  azithromycin (ZITHROMAX) 500 mg in sodium chloride 0.9 % 250 mL IVPB        500 mg 250 mL/hr over 60 Minutes Intravenous  Once 07/13/20 1803 07/13/20 2114       Subjective: Patient seen and examined at bedside.  Poor historian.  No overnight fever, vomiting, worsening shortness of breath reported.  She feels slightly better than yesterday and feels that her diarrhea is improving.   Objective: Vitals:   07/18/20 1925 07/19/20 0149 07/19/20 0446 07/19/20 0737  BP: (!) 142/76 (!) 152/84 119/65 121/70  Pulse:  90 72 71  Resp:  13    Temp: 98 F (36.7 C) 99.3 F (37.4 C) 98 F (36.7 C) 98.2 F (36.8 C)  TempSrc: Oral Oral Oral Oral  SpO2:  100% 98% 100%  Weight:   88.8 kg   Height:        Intake/Output Summary (Last 24 hours) at 07/19/2020 0757 Last data filed at 07/19/2020 0300 Gross per 24 hour  Intake 200 ml  Output 2350 ml  Net -2150 ml   Filed Weights   07/17/20 0500 07/18/20 0504 07/19/20 0446  Weight: 86.7 kg 88.8 kg 88.8 kg    Examination:  General exam: No acute distress.  Currently on room air.  Chronically ill looking female lying on bed. Respiratory system: Bilateral decreased breath sounds at bases scattered crackles.  Cardiovascular system: S1-S2 heard, rate controlled Gastrointestinal system: Abdomen is nondistended, soft and nontender.  Normal bowel sounds are heard  extremities: No cyanosis.  Mild lower extremity edema present.    Central nervous system: Extremely poor historian.  Still very slow to respond to questions.  Awake.  No focal neurological deficits.  Moving extremities Skin: No obvious rashes/petechiae  psychiatry: Affect is flat.     Data Reviewed: I have personally reviewed following labs and imaging studies  CBC: Recent Labs  Lab 07/15/20 1411 07/16/20 0633 07/17/20 0848 07/18/20 0802 07/19/20 0418  WBC 5.7 4.5 3.8* 4.6 4.2  NEUTROABS 4.0 2.6 2.1 2.3 2.1  HGB 9.8* 8.4* 8.7* 9.5* 8.7*  HCT 31.6* 27.3* 28.7* 30.9* 28.1*  MCV 99.1 96.8 97.0 97.2 95.9  PLT 142* PLATELET CLUMPS NOTED ON SMEAR, UNABLE TO ESTIMATE 143* 197 188   Basic Metabolic Panel: Recent Labs  Lab 07/15/20 1411 07/16/20 0633 07/17/20 0848 07/18/20 0802 07/19/20 0418  NA 140 141 141 142 143  K 4.7 3.9 4.2 4.3 3.8  CL 111 107  107 107 107  CO2 24 24 21* 25 27  GLUCOSE 274* 222* 182* 151* 149*  BUN 13 10 7* 6* 9  CREATININE 1.22* 1.13* 1.08* 1.07* 1.23*  CALCIUM 8.5* 8.1* 8.2* 8.5* 8.1*  MG 2.2 2.2 2.1 2.1 1.8   GFR: Estimated Creatinine Clearance: 36.8 mL/min (A) (by C-G formula based on SCr of 1.23 mg/dL (H)). Liver Function Tests: Recent Labs  Lab 07/15/20 1411 07/16/20 0633 07/17/20 0848 07/18/20 0802 07/19/20 0418  AST 36 28 31 25 22   ALT 31 28 30 27 23   ALKPHOS 64 54 55 65 58  BILITOT 0.6 0.6 0.4 0.5 0.4  PROT 6.2* 5.3* 5.7* 5.9* 5.6*  ALBUMIN 2.4* 2.0* 2.2* 2.2* 2.1*   No results for input(s): LIPASE, AMYLASE in the last 168 hours. No results for input(s): AMMONIA in the last 168 hours. Coagulation Profile: Recent Labs  Lab 07/13/20 0934  INR 1.1   Cardiac Enzymes: No results for input(s): CKTOTAL, CKMB, CKMBINDEX, TROPONINI in the last 168 hours. BNP (last 3 results) No results for input(s): PROBNP in the last 8760 hours. HbA1C: No results for input(s): HGBA1C in the last 72 hours. CBG: Recent Labs  Lab 07/18/20 0725 07/18/20 1118 07/18/20 1621 07/18/20 2053 07/19/20 0733  GLUCAP  73 176* 246* 298* 87   Lipid Profile: No results for input(s): CHOL, HDL, LDLCALC, TRIG, CHOLHDL, LDLDIRECT in the last 72 hours. Thyroid Function Tests: No results for input(s): TSH, T4TOTAL, FREET4, T3FREE, THYROIDAB in the last 72 hours. Anemia Panel: No results for input(s): VITAMINB12, FOLATE, FERRITIN, TIBC, IRON, RETICCTPCT in the last 72 hours. Sepsis Labs: Recent Labs  Lab 07/13/20 1213 07/13/20 1850 07/13/20 2116 07/13/20 2316 07/14/20 0900  PROCALCITON  --   --   --   --  8.15  LATICACIDVEN 1.1 1.7 0.5 1.9  --     Recent Results (from the past 240 hour(s))  SARS Coronavirus 2 by RT PCR (hospital order, performed in [facility] hospital lab) Nasopharyngeal Nasopharyngeal Swab     Status: Abnormal   Collection Time: 07/13/20  9:40 AM   Specimen: Nasopharyngeal Swab  Result Value Ref Range Status   SARS Coronavirus 2 POSITIVE (A) NEGATIVE Final    Comment: emailed L. Berdik RN 11:55 07/13/20 ([email or web address]) (NOTE) SARS-CoV-2 target nucleic acids are DETECTED  SARS-CoV-2 RNA is generally detectable in upper respiratory specimens  during the acute phase of infection.  Positive results are indicative  of the presence of the identified virus, but do not rule out bacterial infection or co-infection with other pathogens not detected by the test.  Clinical correlation with patient history and  other diagnostic information is necessary to determine patient infection status.  The expected result is negative.  Fact Sheet for Patients:   StrictlyIdeas.no   Fact Sheet for Healthcare Providers:   BankingDealers.co.za    This test is not yet approved or cleared by the [place] FDA and  has been authorized for detection and/or diagnosis of SARS-CoV-2 by FDA under an Emergency Use Authorization (EUA).  This EUA will remain in effect (meaning this test can be used) for the duration of  the  COVID-19 declaration under Section 564(b)(1)  of the Act, 21 U.S.C. section 360-bbb-3(b)(1), unless the authorization is terminated or revoked sooner.  Performed at [facility], [place] [address], [place], [place] [address]   Urine culture     Status: None   Collection Time: 07/13/20  1:50 PM   Specimen: Urine, Random  Result Value  Ref Range Status   Specimen Description URINE, RANDOM  Final   Special Requests NONE  Final   Culture   Final    NO GROWTH Performed at [facility], [address], [place], [place] [address]    Report Status 07/14/2020 FINAL  Final  Blood culture (routine x 2)     Status: None   Collection Time: 07/13/20  6:50 PM   Specimen: BLOOD  Result Value Ref Range Status   Specimen Description BLOOD RIGHT ANTECUBITAL  Final   Special Requests   Final    BOTTLES DRAWN AEROBIC AND ANAEROBIC Blood Culture results may not be optimal due to an inadequate volume of blood received in culture bottles   Culture   Final    NO GROWTH 5 DAYS Performed at [facility], [place] [address] [place], [place] [address]    Report Status 07/18/2020 FINAL  Final  Blood culture (routine x 2)     Status: None   Collection Time: 07/13/20  6:54 PM   Specimen: BLOOD  Result Value Ref Range Status   Specimen Description BLOOD LEFT ANTECUBITAL  Final   Special Requests   Final    BOTTLES DRAWN AEROBIC AND ANAEROBIC Blood Culture results may not be optimal due to an inadequate volume of blood received in culture bottles   Culture   Final    NO GROWTH 5 DAYS Performed at [facility], [place] [address], [place], [place] [address]    Report Status 07/18/2020 FINAL  Final  Culture, sputum-assessment     Status: None   Collection Time: 07/14/20 12:05 AM   Specimen: Sputum  Result Value Ref Range Status   Specimen Description SPUTUM  Final   Special Requests NONE  Final   Sputum evaluation   Final    THIS SPECIMEN IS ACCEPTABLE FOR SPUTUM CULTURE Performed at [facility], [place] [address]  [place], [place] [address]    Report Status 07/14/2020 FINAL  Final  Culture, respiratory     Status: None   Collection Time: 07/14/20 12:05 AM   Specimen: SPU  Result Value Ref Range Status   Specimen Description SPUTUM  Final   Special Requests NONE Reflexed from [id number]  Final   Gram Stain   Final    RARE WBC PRESENT,BOTH PMN AND MONONUCLEAR MODERATE GRAM POSITIVE COCCI IN PAIRS IN CLUSTERS    Culture   Final    RARE Normal respiratory flora-no Staph aureus or Pseudomonas seen Performed at [facility], [place] [address] [place], [place] [address]    Report Status 07/16/2020 FINAL  Final  MRSA PCR Screening     Status: None   Collection Time: 07/14/20  9:25 AM   Specimen: Nasal Mucosa; Nasopharyngeal  Result Value Ref Range Status   MRSA by PCR NEGATIVE NEGATIVE Final    Comment:        The GeneXpert MRSA Assay (FDA approved for NASAL specimens only), is one component of a comprehensive MRSA colonization surveillance program. It is not intended to diagnose MRSA infection nor to guide or monitor treatment for MRSA infections. Performed at [facility], [place] [address] [place], [place] [address]   Respiratory Panel by RT PCR (Flu A&B, Covid) - Nasopharyngeal Swab     Status: None   Collection Time: 07/17/20 12:04 PM   Specimen: Nasopharyngeal Swab  Result Value Ref Range Status   SARS Coronavirus 2 by RT PCR NEGATIVE NEGATIVE Final  Comment: (NOTE) SARS-CoV-2 target nucleic acids are NOT DETECTED.  The SARS-CoV-2 RNA is generally detectable in upper respiratoy specimens during the acute phase of infection. The lowest concentration of SARS-CoV-2 viral copies this assay can detect is 131 copies/mL. A negative result does not preclude SARS-Cov-2 infection and should not be used as the sole basis for treatment or other patient management decisions. A negative result may occur with  improper specimen collection/handling, submission of specimen other than  nasopharyngeal swab, presence of viral mutation(s) within the areas targeted by this assay, and inadequate number of viral copies (<131 copies/mL). A negative result must be combined with clinical observations, patient history, and epidemiological information. The expected result is Negative.  Fact Sheet for Patients:  PinkCheek.be  Fact Sheet for Healthcare Providers:  GravelBags.it  This test is no t yet approved or cleared by the [place] FDA and  has been authorized for detection and/or diagnosis of SARS-CoV-2 by FDA under an Emergency Use Authorization (EUA). This EUA will remain  in effect (meaning this test can be used) for the duration of the COVID-19 declaration under Section 564(b)(1) of the Act, 21 U.S.C. section 360bbb-3(b)(1), unless the authorization is terminated or revoked sooner.     Influenza A by PCR NEGATIVE NEGATIVE Final   Influenza B by PCR NEGATIVE NEGATIVE Final    Comment: (NOTE) The Xpert Xpress SARS-CoV-2/FLU/RSV assay is intended as an aid in  the diagnosis of influenza from Nasopharyngeal swab specimens and  should not be used as a sole basis for treatment. Nasal washings and  aspirates are unacceptable for Xpert Xpress SARS-CoV-2/FLU/RSV  testing.  Fact Sheet for Patients: PinkCheek.be  Fact Sheet for Healthcare Providers: GravelBags.it  This test is not yet approved or cleared by the [place] FDA and  has been authorized for detection and/or diagnosis of SARS-CoV-2 by  FDA under an Emergency Use Authorization (EUA). This EUA will remain  in effect (meaning this test can be used) for the duration of the  Covid-19 declaration under Section 564(b)(1) of the Act, 21  U.S.C. section 360bbb-3(b)(1), unless the authorization is  terminated or revoked. Performed at [facility], [place] [address], [place],  [place] [address]          Radiology Studies: DG CHEST PORT 1 VIEW  Result Date: 07/18/2020 CLINICAL DATA:  Dyspnea EXAM: PORTABLE CHEST 1 VIEW COMPARISON:  07/13/2020 FINDINGS: Similar enlargement the cardiac silhouette. Stable cardiac rhythm maintenance device. Similar pulmonary vascular congestion. Slightly improved bilateral interstitial ground-glass opacities. No visible pleural effusions. No pneumothorax. No acute osseous abnormality. IMPRESSION: Slightly improved bilateral interstitial and ground-glass opacities, which may represent pulmonary edema (favored) and or multifocal pneumonia. Electronically Signed   By: Margaretha Sheffield MD   On: 07/18/2020 13:49   DG Abd 2 Views  Result Date: 07/17/2020 CLINICAL DATA:  Abdominal pain. EXAM: X-RAY ABDOMEN 3 VIEWS COMPARISON:  None. FINDINGS: The bowel gas pattern is normal. There is no evidence of free air. No radio-opaque calculi or other significant radiographic abnormality is seen. IMPRESSION: Negative. Electronically Signed   By: Marijo Conception M.D.   On: 07/17/2020 10:29        Scheduled Meds: . amiodarone  200 mg Oral Daily  . anastrozole  1 mg Oral Daily  . apixaban  2.5 mg Oral BID  . atorvastatin  20 mg Oral QHS  . cholecalciferol  1,000 Units Oral Daily  . feeding supplement (GLUCERNA SHAKE)  237 mL Oral BID BM  . ferrous  sulfate  325 mg Oral Q breakfast  . insulin aspart  0-9 Units Subcutaneous TID WC  . insulin detemir  15 Units Subcutaneous QHS  . metoprolol succinate  25 mg Oral Daily  . pantoprazole  40 mg Oral Daily  . pregabalin  75 mg Oral QHS  . torsemide  20 mg Oral Daily  . vitamin B-12  1,000 mcg Oral Daily   Continuous Infusions: . ampicillin-sulbactam (UNASYN) IV 3 g (07/19/20 0152)          Aline August, MD [facility] 07/19/2020, 7:57 AM

## 2020-07-19 NOTE — Progress Notes (Signed)
Pharmacy Antibiotic Note  [name] is a 86 y.o. female admitted on 07/13/2020 with COVID-19 and aspiration pneumonia.  Pharmacy has been consulted for Unasyn dosing.  Plan: - Continue Unasyn 3g IV q6h until 7 days of therapy have been completed (last dose 9/27) - Monitor renal function, cultures and clinical status - If the patient discharges prior to completion of antibiotic course, will transition to oral therapy for the remainder of antibiotic therapy  Height: 5\' 5"  (165.1 cm) Weight: 88.8 kg (195 lb 12.3 oz) IBW/kg (Calculated) : 57  Temp (24hrs), Avg:98.4 F (36.9 C), Min:98 F (36.7 C), Max:99.3 F (37.4 C)  Recent Labs  Lab 07/13/20 0935 07/13/20 1213 07/13/20 1850 07/13/20 2116 07/13/20 2316 07/14/20 0020 07/15/20 1411 07/16/20 0633 07/17/20 0848 07/18/20 0802 07/19/20 0418  WBC  --   --   --   --   --    < > 5.7 4.5 3.8* 4.6 4.2  CREATININE  --   --   --   --   --    < > 1.22* 1.13* 1.08* 1.07* 1.23*  LATICACIDVEN 0.8 1.1 1.7 0.5 1.9  --   --   --   --   --   --    < > = values in this interval not displayed.    Estimated Creatinine Clearance: 36.8 mL/min (A) (by C-G formula based on SCr of 1.23 mg/dL (H)).    Allergies  Allergen Reactions  . Aspirin Itching  . Penicillins Itching and Rash    DID THE REACTION INVOLVE: Swelling of the face/tongue/throat, SOB, or low BP? Yes Sudden or severe rash/hives, skin peeling, or the inside of the mouth or nose? No Did it require medical treatment? Yes When did it last happen?"More than 10 years ago" If all above answers are "NO", may proceed with cephalosporin use.     Antimicrobials this admission: 9/21 Unasyn >>  Dose adjustments this admission: 9/21-9/22 Unasyn 3g q12h >> 9/22-(9/27) Unasyn 3g q6h   Microbiology results: 9/20 Bcx NGTD 9/20 UCx negative 9/21 MRSA PCR negative 9/21 sputum Cx rare WBC, PMN and mononuc, mod gm + cocci pairs/clusters  9/24 respiratory panel negative   Thank you for  allowing pharmacy to be a part of this patient's care.  Shauna Hugh, PharmD, [place]  PGY-1 Pharmacy Resident 07/19/2020 9:10 AM  Please check [email or web address] for unit-specific pharmacy phone numbers.

## 2020-07-19 NOTE — TOC Progression Note (Signed)
Transition of Care [facility]) - Progression Note    Patient Details  Name: [name] MRN: [id number] Date of Birth: 04-20-1933  Transition of Care [facility]) CM/SW [address], [place] Phone Number: [phone] 07/19/2020, 11:02 AM  Clinical Narrative:     CSW spoke with patient's daughter and granddaughter, [name] and [place] about bed offers. They explained that they did not want to accept the offer from [facility] . They also did not want patient to go to [facility] or [facility]. They stated that their first choice was [facility]. CSW explained that a referral would need to be sent. They shared their order of preference for the SNFs: 1. [facility]- referral sent out 2. [facility]- CSW called and asked for it to be reviewed. 3. [facility].  They stated they would consider [facility] as their last option it the other two options was not available.  Patient received COVID test on 9/24. MD Starla Link was about discharge plans.  TOC team will continue to assist with discharge planning needs.      Expected Discharge Plan and Services                                                 Social Determinants of Health (SDOH) Interventions    Readmission Risk Interventions Readmission Risk Prevention Plan 01/03/2019 01/02/2019  Transportation Screening (No Data) Complete  PCP or Specialist Appt within 3-5 Days - Complete  HRI or [place] - Complete  Social Work Consult for [place] Planning/Counseling - Complete  Palliative Care Screening - Not Applicable  Medication Review Press photographer) - Complete  Some recent data might be hidden

## 2020-07-20 DIAGNOSIS — I420 Dilated cardiomyopathy: Secondary | ICD-10-CM

## 2020-07-20 DIAGNOSIS — E1122 Type 2 diabetes mellitus with diabetic chronic kidney disease: Secondary | ICD-10-CM | POA: Diagnosis not present

## 2020-07-20 DIAGNOSIS — I6930 Unspecified sequelae of cerebral infarction: Secondary | ICD-10-CM | POA: Diagnosis not present

## 2020-07-20 DIAGNOSIS — N1831 Chronic kidney disease, stage 3a: Secondary | ICD-10-CM

## 2020-07-20 DIAGNOSIS — R778 Other specified abnormalities of plasma proteins: Secondary | ICD-10-CM | POA: Diagnosis not present

## 2020-07-20 DIAGNOSIS — E162 Hypoglycemia, unspecified: Secondary | ICD-10-CM | POA: Diagnosis not present

## 2020-07-20 DIAGNOSIS — C50919 Malignant neoplasm of unspecified site of unspecified female breast: Secondary | ICD-10-CM | POA: Diagnosis not present

## 2020-07-20 DIAGNOSIS — K219 Gastro-esophageal reflux disease without esophagitis: Secondary | ICD-10-CM | POA: Diagnosis not present

## 2020-07-20 DIAGNOSIS — Z86718 Personal history of other venous thrombosis and embolism: Secondary | ICD-10-CM | POA: Diagnosis not present

## 2020-07-20 DIAGNOSIS — M2550 Pain in unspecified joint: Secondary | ICD-10-CM | POA: Diagnosis not present

## 2020-07-20 DIAGNOSIS — Z950 Presence of cardiac pacemaker: Secondary | ICD-10-CM | POA: Diagnosis not present

## 2020-07-20 DIAGNOSIS — J690 Pneumonitis due to inhalation of food and vomit: Secondary | ICD-10-CM | POA: Diagnosis not present

## 2020-07-20 DIAGNOSIS — Z7401 Bed confinement status: Secondary | ICD-10-CM | POA: Diagnosis not present

## 2020-07-20 DIAGNOSIS — R5381 Other malaise: Secondary | ICD-10-CM | POA: Diagnosis not present

## 2020-07-20 DIAGNOSIS — I10 Essential (primary) hypertension: Secondary | ICD-10-CM

## 2020-07-20 DIAGNOSIS — I5033 Acute on chronic diastolic (congestive) heart failure: Secondary | ICD-10-CM

## 2020-07-20 DIAGNOSIS — I5040 Unspecified combined systolic (congestive) and diastolic (congestive) heart failure: Secondary | ICD-10-CM | POA: Diagnosis not present

## 2020-07-20 DIAGNOSIS — D649 Anemia, unspecified: Secondary | ICD-10-CM | POA: Diagnosis not present

## 2020-07-20 DIAGNOSIS — Z23 Encounter for immunization: Secondary | ICD-10-CM | POA: Diagnosis not present

## 2020-07-20 DIAGNOSIS — E119 Type 2 diabetes mellitus without complications: Secondary | ICD-10-CM | POA: Diagnosis not present

## 2020-07-20 DIAGNOSIS — Z743 Need for continuous supervision: Secondary | ICD-10-CM | POA: Diagnosis not present

## 2020-07-20 DIAGNOSIS — E118 Type 2 diabetes mellitus with unspecified complications: Secondary | ICD-10-CM | POA: Diagnosis not present

## 2020-07-20 DIAGNOSIS — I480 Paroxysmal atrial fibrillation: Secondary | ICD-10-CM | POA: Diagnosis not present

## 2020-07-20 DIAGNOSIS — I5032 Chronic diastolic (congestive) heart failure: Secondary | ICD-10-CM | POA: Diagnosis not present

## 2020-07-20 LAB — CBC WITH DIFFERENTIAL/PLATELET
Abs Immature Granulocytes: 0.04 10*3/uL (ref 0.00–0.07)
Basophils Absolute: 0 10*3/uL (ref 0.0–0.1)
Basophils Relative: 1 %
Eosinophils Absolute: 0.5 10*3/uL (ref 0.0–0.5)
Eosinophils Relative: 11 %
HCT: 26.7 % — ABNORMAL LOW (ref 36.0–46.0)
Hemoglobin: 8.4 g/dL — ABNORMAL LOW (ref 12.0–15.0)
Immature Granulocytes: 1 %
Lymphocytes Relative: 25 %
Lymphs Abs: 1.1 10*3/uL (ref 0.7–4.0)
MCH: 30.5 pg (ref 26.0–34.0)
MCHC: 31.5 g/dL (ref 30.0–36.0)
MCV: 97.1 fL (ref 80.0–100.0)
Monocytes Absolute: 0.6 10*3/uL (ref 0.1–1.0)
Monocytes Relative: 13 %
Neutro Abs: 2.1 10*3/uL (ref 1.7–7.7)
Neutrophils Relative %: 49 %
Platelets: 218 10*3/uL (ref 150–400)
RBC: 2.75 MIL/uL — ABNORMAL LOW (ref 3.87–5.11)
RDW: 14.6 % (ref 11.5–15.5)
WBC: 4.3 10*3/uL (ref 4.0–10.5)
nRBC: 0 % (ref 0.0–0.2)

## 2020-07-20 LAB — BASIC METABOLIC PANEL
Anion gap: 8 (ref 5–15)
BUN: 13 mg/dL (ref 8–23)
CO2: 27 mmol/L (ref 22–32)
Calcium: 8.1 mg/dL — ABNORMAL LOW (ref 8.9–10.3)
Chloride: 106 mmol/L (ref 98–111)
Creatinine, Ser: 1.39 mg/dL — ABNORMAL HIGH (ref 0.44–1.00)
GFR calc Af Amer: 40 mL/min — ABNORMAL LOW (ref >60)
GFR calc non Af Amer: 34 mL/min — ABNORMAL LOW (ref >60)
Glucose, Bld: 208 mg/dL — ABNORMAL HIGH (ref 70–99)
Potassium: 3.7 mmol/L (ref 3.5–5.1)
Sodium: 141 mmol/L (ref 135–145)

## 2020-07-20 LAB — GLUCOSE, CAPILLARY
Glucose-Capillary: 123 mg/dL — ABNORMAL HIGH (ref 70–99)
Glucose-Capillary: 175 mg/dL — ABNORMAL HIGH (ref 70–99)
Glucose-Capillary: 235 mg/dL — ABNORMAL HIGH (ref 70–99)
Glucose-Capillary: 224 mg/dL — ABNORMAL HIGH (ref 70–99)

## 2020-07-20 LAB — RESPIRATORY PANEL BY RT PCR (FLU A&B, COVID)
Influenza A by PCR: NEGATIVE
Influenza B by PCR: NEGATIVE
SARS Coronavirus 2 by RT PCR: NEGATIVE

## 2020-07-20 LAB — MAGNESIUM: Magnesium: 1.8 mg/dL (ref 1.7–2.4)

## 2020-07-20 MED ORDER — METOPROLOL SUCCINATE ER 25 MG PO TB24: 25.0000 mg | ORAL_TABLET | Freq: Every day | ORAL | 0 refills | Status: DC

## 2020-07-20 MED ORDER — TORSEMIDE 20 MG PO TABS: 20.0000 mg | ORAL_TABLET | Freq: Every day | ORAL | Status: DC

## 2020-07-20 MED ORDER — INSULIN DETEMIR 100 UNIT/ML ~~LOC~~ SOLN: 15.0000 [IU] | Freq: Every day | SUBCUTANEOUS | Status: DC

## 2020-07-20 NOTE — Discharge Summary (Addendum)
Physician Discharge Summary  [name] NWG:[id number] DOB: 08-01-1933 DOA: 07/13/2020  PCP: [name], MD  Admit date: 07/13/2020 Discharge date: 07/20/2020  Admitted From: SNF Disposition: SNF  Recommendations for Outpatient Follow-up:  1. Follow up with SNF provider at earliest convenience with repeat CBC/BMP in the next few days 2. Outpatient follow-up with cardiology and palliative care 3. Follow up in ED if symptoms worsen or new appear   Home Health: No Equipment/Devices: None  Discharge Condition: Stable CODE STATUS: Full Diet recommendation: Heart healthy/fluid restriction of up to 1200 cc a day.  Brief/Interim Summary: 86 year old female with history of diabetes mellitus type 2, breast cancer on anastrozole, SVT status post ablation with permanent pacemaker, chronic combined heart failure, chronic kidney disease stage IIIb, unspecified stroke, recurrent DVT on Eliquis, recent COVID-19 pneumonia and discharged to a nursing home presented with very low blood sugars at nursing home along with poor appetite and brief.  Of unresponsiveness.  On presentation, CT of the head was negative for any acute abnormality.  She was noted to be slightly hypoxic with mild fever and there was a suspicion for aspiration pneumonia for which she was started on antibiotics.  During the hospitalization, her condition has gradually improved.  Today is day #7 of her antibiotics so she will not need any more antibiotic treatment.  Cardiology has evaluated the patient and signed off and recommended outpatient follow-up.  PT recommended SNF placement.  She will be discharged to SNF once bed is available.  She will need outpatient palliative care follow-up as well.  Discharge Diagnoses:   Possible aspiration pneumonia hypoxia -Currently on Unasyn, today's day #7.  DC after today's doses. she will not need any more antibiotic treatment.   -Currently on room air. -Diet as per SLP recommendations.   Cultures negative so far -Afebrile over the last several days.  Acute metabolic encephalopathy -Probably from pneumonia and hypoglycemia. -Mental status improving.   PT recommends SNF placement.  Severe sepsis: present on admission -Presented with fever, tachypnea with respiratory rate more than 20, hypoxia, acute metabolic encephalopathy possibly from aspiration pneumonia -Resolved  Recent fully treated COVID-19 pneumonia -Patient had a recent hospitalization and was treated for COVID-19 pneumonia. -Incentive spirometry.  Hypertension -Blood pressure stable.  Continue beta-blocker and torsemide.    Diabetes mellitus type 2 uncontrolled with hypoglycemia/hyperglycemia -Presented with hypoglycemia and altered mental status probably from poor oral intake.  A1c 9.0.  Started Levemir on 07/13/2020.  Continue Levemir 15 units nightly.  Carb modified diet.  Outpatient follow-up.  Dehydration -Improving.  Off IV fluids.  AKI on CKD stage IIIb -Baseline creatinine 1.5.  Creatinine 1.39 today.  Outpatient follow-up  Chronic combined CHF -EF of 30% on most recent echocardiogram. Strict input output.  Daily weights.  No signs of fluid overload -Cardiology evaluation and follow-up appreciated.  Continue Toprol. -Torsemide resumed on 07/18/2020 at 20 mg daily.  Positive troponins -Probably from demand ischemia.  Cardiology following and recommending no plans for further inpatient ischemic evaluation.  History of DVT -Continue Eliquis  Prior unspecified CVA -Continue statin and Eliquis  History of SVT/pacemaker placement -On amiodarone as an outpatient for possible?  SVT.  Continue amiodarone.  Outpatient follow-up with cardiology  Chronic weakness and deconditioning -Wheelchair-bound at baseline.  Currently from SNF.  Social worker following for placement to a different SNF -Palliative care following.  CODE STATUS has been changed to DNR per palliative care  team.  Discharge Instructions  Discharge Instructions    Ambulatory referral to  Cardiology   Complete by: As directed    Hospital follow-up   Diet - low sodium heart healthy   Complete by: As directed    Diet Carb Modified   Complete by: As directed    Increase activity slowly   Complete by: As directed      Allergies as of 07/20/2020      Reactions   Aspirin Itching   Penicillins Itching, Rash   DID THE REACTION INVOLVE: Swelling of the face/tongue/throat, SOB, or low BP? Yes Sudden or severe rash/hives, skin peeling, or the inside of the mouth or nose? No Did it require medical treatment? Yes When did it last happen?"More than 10 years ago" If all above answers are "NO", may proceed with cephalosporin use.      Medication List    STOP taking these medications   BiDil 20-37.5 MG tablet Generic drug: isosorbide-hydrALAZINE   carvedilol 12.5 MG tablet Commonly known as: COREG     TAKE these medications   acetaminophen 500 MG tablet Commonly known as: TYLENOL Take 1,000 mg by mouth every 6 (six) hours as needed for mild pain, moderate pain or headache.   albuterol 108 (90 Base) MCG/ACT inhaler Commonly known as: VENTOLIN HFA Inhale 2 puffs into the lungs every 4 (four) hours as needed for wheezing or shortness of breath.   amiodarone 200 MG tablet Commonly known as: PACERONE Take one tablet by mouth daily Monday-Saturday. Do NOT take Sunday What changed:   how much to take  how to take this  when to take this   anastrozole 1 MG tablet Commonly known as: ARIMIDEX TAKE 1 TABLET(1 MG) BY MOUTH DAILY What changed: See the new instructions.   atorvastatin 20 MG tablet Commonly known as: LIPITOR Take 1 tablet (20 mg total) by mouth at bedtime.   benzonatate 100 MG capsule Commonly known as: TESSALON Take 1 capsule (100 mg total) by mouth every 8 (eight) hours. Swallow capsule whole.  Do not chew   cholecalciferol 25 MCG (1000 UNIT) tablet Commonly known  as: VITAMIN D3 Take 1,000 Units by mouth daily.   diclofenac sodium 1 % Gel Commonly known as: Voltaren Apply 2 g topically 4 (four) times daily.   Eliquis 2.5 MG Tabs tablet Generic drug: apixaban TAKE 1 TABLET(2.5 MG) BY MOUTH TWICE DAILY What changed: See the new instructions.   famotidine 40 MG tablet Commonly known as: PEPCID Take 40 mg by mouth 2 (two) times daily.   ferrous sulfate 325 (65 FE) MG tablet Take 325 mg by mouth daily with breakfast.   HumaLOG KwikPen 200 UNIT/ML KwikPen Generic drug: insulin lispro INJECT 3 UNITS UNDER THE SKIN BEFORE BREAKFAST, LUNCH, DINNER IF SUGARS ARE GREATER THAN 175. What changed: See the new instructions.   insulin detemir 100 UNIT/ML injection Commonly known as: LEVEMIR Inject 0.15 mLs (15 Units total) into the skin at bedtime. What changed: how much to take   ipratropium 17 MCG/ACT inhaler Commonly known as: ATROVENT HFA Inhale 2 puffs into the lungs every 6 (six) hours as needed for wheezing.   metoprolol succinate 25 MG 24 hr tablet Commonly known as: TOPROL-XL Take 1 tablet (25 mg total) by mouth daily. Start taking on: July 21, 2020   mirtazapine 7.5 MG tablet Commonly known as: REMERON TAKE 1 TABLET(7.5 MG) BY MOUTH AT BEDTIME What changed: See the new instructions.   Myrbetriq 50 MG Tb24 tablet Generic drug: mirabegron ER Take 50 mg by mouth daily.   omeprazole 40 MG capsule  Commonly known as: PRILOSEC TAKE 1 CAPSULE BY MOUTH DAILY BEFORE A MEAL What changed:   how much to take  how to take this  when to take this  additional instructions   oxybutynin 5 MG tablet Commonly known as: DITROPAN TAKE 1 TABLET(5 MG) BY MOUTH TWICE DAILY What changed: See the new instructions.   Ozempic (0.25 or 0.5 MG/DOSE) 2 MG/1.5ML Sopn Generic drug: Semaglutide(0.25 or 0.5MG /DOS) Inject 0.25 mg into the skin once a week. Take on Wednesday   pregabalin 75 MG capsule Commonly known as: LYRICA Take 1 capsule  (75 mg total) by mouth at bedtime.   torsemide 20 MG tablet Commonly known as: DEMADEX Take 1 tablet (20 mg total) by mouth daily. You may take an extra 20 mg tablet daily as needed for swelling What changed: when to take this   vitamin B-12 1000 MCG tablet Commonly known as: CYANOCOBALAMIN Take 1,000 mcg by mouth daily.       Contact information for follow-up providers    [name], MD. Schedule an appointment as soon as possible for a visit in 3 days.   Specialty: Internal Medicine Contact information: [address] [place] [address] [phone]        Imogene Burn, PA-C Follow up.   Specialty: Cardiology Why: Hospital follow-up scheduled for 08/04/2020 at 9:15am with Ermalinda Barrios, one of Dr. Hassell Done PAs. Please arrive 15 minute early for check-in. If this date/time does not work for you, please call our office to reschedule. Contact information: [address] [place] [address] [phone]            Contact information for after-discharge care    Destination    [facility] .   Service: Skilled Nursing Contact information: [address] [address] [phone]                 Allergies  Allergen Reactions  . Aspirin Itching  . Penicillins Itching and Rash    DID THE REACTION INVOLVE: Swelling of the face/tongue/throat, SOB, or low BP? Yes Sudden or severe rash/hives, skin peeling, or the inside of the mouth or nose? No Did it require medical treatment? Yes When did it last happen?"More than 10 years ago" If all above answers are "NO", may proceed with cephalosporin use.     Consultations:  Cardiology/palliative care   Procedures/Studies: CT Head Wo Contrast  Result Date: 07/13/2020 CLINICAL DATA:  Mental status change. EXAM: CT HEAD WITHOUT CONTRAST TECHNIQUE: Contiguous axial images were obtained from the base of the skull through the  vertex without intravenous contrast. COMPARISON:  08/10/2019 FINDINGS: Brain: No evidence for acute hemorrhage, mass lesion, midline shift, hydrocephalus or large infarct. Small calcifications in the basal ganglia. Vascular: No hyperdense vessel or unexpected calcification. Skull: Normal. Negative for fracture or focal lesion. Sinuses/Orbits: Mucosal thickening in the maxillary sinuses, right side greater than left. Mild mucosal thickening involving the left frontal sinus and ethmoid air cells. Other: None IMPRESSION: 1. No acute intracranial abnormality. 2. Mild paranasal sinus disease. Electronically Signed   By: Markus Daft M.D.   On: 07/13/2020 10:45   DG CHEST PORT 1 VIEW  Result Date: 07/18/2020 CLINICAL DATA:  Dyspnea EXAM: PORTABLE CHEST 1 VIEW COMPARISON:  07/13/2020 FINDINGS: Similar enlargement the cardiac silhouette. Stable cardiac rhythm maintenance device. Similar pulmonary vascular congestion. Slightly improved bilateral interstitial ground-glass opacities. No visible pleural effusions. No pneumothorax. No acute osseous abnormality. IMPRESSION: Slightly  improved bilateral interstitial and ground-glass opacities, which may represent pulmonary edema (favored) and or multifocal pneumonia. Electronically Signed   By: Margaretha Sheffield MD   On: 07/18/2020 13:49   DG Chest Portable 1 View  Result Date: 07/13/2020 CLINICAL DATA:  Hypoxia EXAM: PORTABLE CHEST 1 VIEW COMPARISON:  07/13/2020 at 9:51 a.m. FINDINGS: Single frontal view of the chest demonstrates stable dual lead pacemaker. Cardiac silhouette is unchanged. Since the prior exam, there is progressive central vascular congestion with increased interstitial and ground-glass opacity within the perihilar distribution. Trace right pleural effusion. No pneumothorax. IMPRESSION: 1. Worsening bilateral perihilar interstitial and ground-glass opacities, favor mild edema over progressive pneumonia. Electronically Signed   By: Randa Ngo M.D.   On:  07/13/2020 17:11   DG Chest Port 1 View  Result Date: 07/13/2020 CLINICAL DATA:  Unresponsive and cough. EXAM: PORTABLE CHEST 1 VIEW COMPARISON:  06/25/2020 FINDINGS: Stable appearance of the left dual chamber cardiac pacemaker. Heart size is upper limits of normal but stable. Evidence for a soft tissue Mach line overlying the left upper chest. No evidence to suggest a pneumothorax. No focal airspace disease or lung consolidation. Trachea is deviated towards the right and this is unchanged. IMPRESSION: Slightly improved aeration at the lung bases compared to the prior examination. No acute cardiopulmonary disease. Electronically Signed   By: Markus Daft M.D.   On: 07/13/2020 10:35   DG CHEST PORT 1 VIEW  Result Date: 06/25/2020 CLINICAL DATA:  History of breast cancer. Heart failure, kidney failure. COVID-19 positive EXAM: PORTABLE CHEST 1 VIEW COMPARISON:  06/22/2020 FINDINGS: Decreased lung volume. Progression of mild bibasilar airspace disease which could be atelectasis or pneumonia. No effusion. Dual lead pacemaker unchanged. Chronic rotator cuff impingement/rotator cuff tear. IMPRESSION: Progression of mild bibasilar airspace disease which may be atelectasis or pneumonia. Electronically Signed   By: Franchot Gallo M.D.   On: 06/25/2020 17:05   DG Chest Port 1 View  Result Date: 06/22/2020 CLINICAL DATA:  COVID positive, weakness EXAM: PORTABLE CHEST 1 VIEW COMPARISON:  06/20/2020 FINDINGS: Chronic mild interstitial prominence. No new consolidation. No pleural effusion. Stable cardiomediastinal contours. Left chest wall pacemaker. IMPRESSION: No significant change since 06/20/2020. Electronically Signed   By: Macy Mis M.D.   On: 06/22/2020 16:29   DG Abd 2 Views  Result Date: 07/17/2020 CLINICAL DATA:  Abdominal pain. EXAM: X-RAY ABDOMEN 3 VIEWS COMPARISON:  None. FINDINGS: The bowel gas pattern is normal. There is no evidence of free air. No radio-opaque calculi or other significant  radiographic abnormality is seen. IMPRESSION: Negative. Electronically Signed   By: Marijo Conception M.D.   On: 07/17/2020 10:29   ECHOCARDIOGRAM COMPLETE  Result Date: 06/23/2020    ECHOCARDIOGRAM REPORT   Patient Name:   [name] Date of Exam: 06/23/2020 Medical Rec #:  [id number]    Height:       65.0 in Accession #:    [id number]   Weight:       192.0 lb Date of Birth:  Apr 28, 1933    BSA:          1.944 m Patient Age:    60 years     BP:           97/55 mmHg Patient Gender: F            HR:           64 bpm. Exam Location:  Inpatient Procedure: 2D Echo, Cardiac Doppler and Color Doppler Indications:  R55 Syncope  History:        Patient has prior history of Echocardiogram examinations, most                 recent 07/22/2019. CHF, Pacemaker, Stroke, Arrythmias:LBBB; Risk                 Factors:Diabetes. Cancer. CKD. COVID-19 Positive.  Sonographer:    Jonelle Sidle Dance Referring Phys: [place]  1. Left ventricular ejection fraction, by estimation, is 30%. The left ventricle has moderate to severely decreased function. The left ventricle demonstrates global hypokinesis with septal-lateral dyssynchrony. There is severe left ventricular hypertrophy. Left ventricular diastolic parameters are consistent with Grade I diastolic dysfunction (impaired relaxation).  2. Right ventricular systolic function is mildly reduced. The right ventricular size is normal. Tricuspid regurgitation signal is inadequate for assessing PA pressure.  3. Left atrial size was severely dilated.  4. The mitral valve is normal in structure. Trivial mitral valve regurgitation. No evidence of mitral stenosis.  5. The aortic valve is tricuspid. Aortic valve regurgitation is not visualized. Mild aortic valve sclerosis is present, with no evidence of aortic valve stenosis.  6. Aortic dilatation noted. There is mild dilatation of the aortic root measuring 39 mm.  7. The inferior vena cava is dilated in size with <50%  respiratory variability, suggesting right atrial pressure of 15 mmHg. FINDINGS  Left Ventricle: Left ventricular ejection fraction, by estimation, is 30%. The left ventricle has moderate to severely decreased function. The left ventricle demonstrates global hypokinesis. The left ventricular internal cavity size was normal in size. There is severe left ventricular hypertrophy. Left ventricular diastolic parameters are consistent with Grade I diastolic dysfunction (impaired relaxation). Right Ventricle: The right ventricular size is normal. No increase in right ventricular wall thickness. Right ventricular systolic function is mildly reduced. Tricuspid regurgitation signal is inadequate for assessing PA pressure. Left Atrium: Left atrial size was severely dilated. Right Atrium: Right atrial size was normal in size. Pericardium: Trivial pericardial effusion is present. Mitral Valve: The mitral valve is normal in structure. Trivial mitral valve regurgitation. No evidence of mitral valve stenosis. Tricuspid Valve: The tricuspid valve is normal in structure. Tricuspid valve regurgitation is trivial. Aortic Valve: The aortic valve is tricuspid. Aortic valve regurgitation is not visualized. Mild aortic valve sclerosis is present, with no evidence of aortic valve stenosis. Pulmonic Valve: The pulmonic valve was normal in structure. Pulmonic valve regurgitation is not visualized. Aorta: Aortic dilatation noted. There is mild dilatation of the aortic root measuring 39 mm. Venous: The inferior vena cava is dilated in size with less than 50% respiratory variability, suggesting right atrial pressure of 15 mmHg. IAS/Shunts: No atrial level shunt detected by color flow Doppler.  LEFT VENTRICLE PLAX 2D LVIDd:         5.00 cm  Diastology LVIDs:         3.20 cm  LV e' lateral:   4.35 cm/s LV PW:         1.20 cm  LV E/e' lateral: 11.8 LV IVS:        1.20 cm  LV e' medial:    3.48 cm/s LVOT diam:     2.30 cm  LV E/e' medial:  14.8 LV  SV:         74 LV SV Index:   38 LVOT Area:     4.15 cm  RIGHT VENTRICLE            IVC RV Basal  diam:  2.70 cm    IVC diam: 2.40 cm RV S prime:     9.36 cm/s TAPSE (M-mode): 1.2 cm LEFT ATRIUM              Index       RIGHT ATRIUM          Index LA diam:        5.40 cm  2.78 cm/m  RA Area:     6.15 cm LA Vol (A2C):   98.9 ml  50.87 ml/m RA Volume:   7.56 ml  3.89 ml/m LA Vol (A4C):   151.0 ml 77.67 ml/m LA Biplane Vol: 125.0 ml 64.29 ml/m  AORTIC VALVE LVOT Vmax:   83.50 cm/s LVOT Vmean:  57.400 cm/s LVOT VTI:    0.177 m  AORTA Ao Root diam: 3.90 cm Ao Asc diam:  3.40 cm MITRAL VALVE MV Area (PHT): 2.69 cm    SHUNTS MV Decel Time: 282 msec    Systemic VTI:  0.18 m MV E velocity: 51.40 cm/s  Systemic Diam: 2.30 cm MV A velocity: 52.80 cm/s MV E/A ratio:  0.97 Loralie Champagne MD Electronically signed by Loralie Champagne MD Signature Date/Time: 06/23/2020/5:51:38 PM    Final        Subjective: Patient seen and examined at bedside.  Poor historian.  Denies worsening abdominal pain, fever, vomiting, diarrhea. Feels better.  Discharge Exam: Vitals:   07/20/20 0939 07/20/20 1221  BP: 114/70 (!) 119/56  Pulse: 74 78  Resp:  16  Temp:  97.9 F (36.6 C)  SpO2:  97%    General exam: No distress.  Currently still on room air.  Chronically ill looking female lying on bed.  Poor historian.  Still slow to respond to questions. Respiratory system: Bilateral decreased breath sounds at bases with some crackles.  No wheezing  cardiovascular system: Rate controlled, S1-S2 heard Gastrointestinal system: Abdomen is nondistended, soft and nontender.  Bowel sounds heard  extremities: Trace lower extremity edema present.  No clubbing     The results of significant diagnostics from this hospitalization (including imaging, microbiology, ancillary and laboratory) are listed below for reference.     Microbiology: Recent Results (from the past 240 hour(s))  SARS Coronavirus 2 by RT PCR (hospital order,  performed in [facility] hospital lab) Nasopharyngeal Nasopharyngeal Swab     Status: Abnormal   Collection Time: 07/13/20  9:40 AM   Specimen: Nasopharyngeal Swab  Result Value Ref Range Status   SARS Coronavirus 2 POSITIVE (A) NEGATIVE Final    Comment: emailed L. Berdik RN 11:55 07/13/20 ([email or web address]) (NOTE) SARS-CoV-2 target nucleic acids are DETECTED  SARS-CoV-2 RNA is generally detectable in upper respiratory specimens  during the acute phase of infection.  Positive results are indicative  of the presence of the identified virus, but do not rule out bacterial infection or co-infection with other pathogens not detected by the test.  Clinical correlation with patient history and  other diagnostic information is necessary to determine patient infection status.  The expected result is negative.  Fact Sheet for Patients:   StrictlyIdeas.no   Fact Sheet for Healthcare Providers:   BankingDealers.co.za    This test is not yet approved or cleared by the [place] FDA and  has been authorized for detection and/or diagnosis of SARS-CoV-2 by FDA under an Emergency Use Authorization (EUA).  This EUA will remain in effect (meaning this test can be used) for the duration of  the  COVID-19 declaration under Section  564(b)(1) of the Act, 21 U.S.C. section 360-bbb-3(b)(1), unless the authorization is terminated or revoked sooner.  Performed at [facility], [place] [address], [place], [place] [address]   Urine culture     Status: None   Collection Time: 07/13/20  1:50 PM   Specimen: Urine, Random  Result Value Ref Range Status   Specimen Description URINE, RANDOM  Final   Special Requests NONE  Final   Culture   Final    NO GROWTH Performed at [facility], [place] [address] [place], [place] [address]    Report Status 07/14/2020 FINAL  Final  Blood culture (routine x 2)     Status: None   Collection Time: 07/13/20  6:50 PM    Specimen: BLOOD  Result Value Ref Range Status   Specimen Description BLOOD RIGHT ANTECUBITAL  Final   Special Requests   Final    BOTTLES DRAWN AEROBIC AND ANAEROBIC Blood Culture results may not be optimal due to an inadequate volume of blood received in culture bottles   Culture   Final    NO GROWTH 5 DAYS Performed at [facility], [place] [address] [place], [place] [address]    Report Status 07/18/2020 FINAL  Final  Blood culture (routine x 2)     Status: None   Collection Time: 07/13/20  6:54 PM   Specimen: BLOOD  Result Value Ref Range Status   Specimen Description BLOOD LEFT ANTECUBITAL  Final   Special Requests   Final    BOTTLES DRAWN AEROBIC AND ANAEROBIC Blood Culture results may not be optimal due to an inadequate volume of blood received in culture bottles   Culture   Final    NO GROWTH 5 DAYS Performed at [facility], [place] [address] [place], [place] [address]    Report Status 07/18/2020 FINAL  Final  Culture, sputum-assessment     Status: None   Collection Time: 07/14/20 12:05 AM   Specimen: Sputum  Result Value Ref Range Status   Specimen Description SPUTUM  Final   Special Requests NONE  Final   Sputum evaluation   Final    THIS SPECIMEN IS ACCEPTABLE FOR SPUTUM CULTURE Performed at [facility], [place] [address], [place], [place] [address]    Report Status 07/14/2020 FINAL  Final  Culture, respiratory     Status: None   Collection Time: 07/14/20 12:05 AM   Specimen: SPU  Result Value Ref Range Status   Specimen Description SPUTUM  Final   Special Requests NONE Reflexed from [id number]  Final   Gram Stain   Final    RARE WBC PRESENT,BOTH PMN AND MONONUCLEAR MODERATE GRAM POSITIVE COCCI IN PAIRS IN CLUSTERS    Culture   Final    RARE Normal respiratory flora-no Staph aureus or Pseudomonas seen Performed at [facility], [place] [address], [place], [place] [address]    Report Status 07/16/2020 FINAL  Final  MRSA PCR Screening     Status:  None   Collection Time: 07/14/20  9:25 AM   Specimen: Nasal Mucosa; Nasopharyngeal  Result Value Ref Range Status   MRSA by PCR NEGATIVE NEGATIVE Final    Comment:        The GeneXpert MRSA Assay (FDA approved for NASAL specimens only), is one component of a comprehensive MRSA colonization surveillance program. It is not intended to diagnose MRSA infection nor to guide or monitor treatment for MRSA infections. Performed at [facility], [place] [address], [place], [place] [address]   Respiratory Panel by RT PCR (Flu A&B, Covid) - Nasopharyngeal Swab     Status: None   Collection Time: 07/17/20 12:04 PM   Specimen: Nasopharyngeal Swab  Result Value Ref Range Status   SARS Coronavirus 2 by RT PCR NEGATIVE NEGATIVE Final    Comment: (NOTE) SARS-CoV-2 target nucleic acids are NOT DETECTED.  The SARS-CoV-2 RNA is generally detectable in upper respiratoy specimens during the acute phase of infection. The lowest concentration of SARS-CoV-2 viral copies this assay can detect is 131 copies/mL. A negative result does not preclude SARS-Cov-2 infection and should not be used as the sole basis for treatment or other patient management decisions. A negative result may occur with  improper specimen collection/handling, submission of specimen other than nasopharyngeal swab, presence of viral mutation(s) within the areas targeted by this assay, and inadequate number of viral copies (<131 copies/mL). A negative result must be combined with clinical observations, patient history, and epidemiological information. The expected result is Negative.  Fact Sheet for Patients:  PinkCheek.be  Fact Sheet for Healthcare Providers:  GravelBags.it  This test is no t yet approved or cleared by the [place] FDA and  has been authorized for detection and/or diagnosis of SARS-CoV-2 by FDA under an Emergency Use Authorization (EUA). This EUA  will remain  in effect (meaning this test can be used) for the duration of the COVID-19 declaration under Section 564(b)(1) of the Act, 21 U.S.C. section 360bbb-3(b)(1), unless the authorization is terminated or revoked sooner.     Influenza A by PCR NEGATIVE NEGATIVE Final   Influenza B by PCR NEGATIVE NEGATIVE Final    Comment: (NOTE) The Xpert Xpress SARS-CoV-2/FLU/RSV assay is intended as an aid in  the diagnosis of influenza from Nasopharyngeal swab specimens and  should not be used as a sole basis for treatment. Nasal washings and  aspirates are unacceptable for Xpert Xpress SARS-CoV-2/FLU/RSV  testing.  Fact Sheet for Patients: PinkCheek.be  Fact Sheet for Healthcare Providers: GravelBags.it  This test is not yet approved or cleared by the [place] FDA and  has been authorized for detection and/or diagnosis of SARS-CoV-2 by  FDA under an Emergency Use Authorization (EUA). This EUA will remain  in effect (meaning this test can be used) for the duration of the  Covid-19 declaration under Section 564(b)(1) of the Act, 21  U.S.C. section 360bbb-3(b)(1), unless the authorization is  terminated or revoked. Performed at [facility], [place] [address], [place], [place] [address]   Respiratory Panel by RT PCR (Flu A&B, Covid) - Nasopharyngeal Swab     Status: None   Collection Time: 07/20/20  1:02 PM   Specimen: Nasopharyngeal Swab  Result Value Ref Range Status   SARS Coronavirus 2 by RT PCR NEGATIVE NEGATIVE Final    Comment: (NOTE) SARS-CoV-2 target nucleic acids are NOT DETECTED.  The SARS-CoV-2 RNA is generally detectable in upper respiratoy specimens during the acute phase of infection. The lowest concentration of SARS-CoV-2 viral copies this assay can detect is 131 copies/mL. A negative result does not preclude SARS-Cov-2 infection and should not be used as the sole basis for treatment or other patient  management decisions. A negative result may occur with  improper specimen collection/handling, submission of specimen other than nasopharyngeal swab, presence of viral mutation(s) within the areas targeted by this assay, and inadequate number of viral copies (<131 copies/mL). A negative result must be combined with clinical observations, patient  history, and epidemiological information. The expected result is Negative.  Fact Sheet for Patients:  PinkCheek.be  Fact Sheet for Healthcare Providers:  GravelBags.it  This test is no t yet approved or cleared by the [place] FDA and  has been authorized for detection and/or diagnosis of SARS-CoV-2 by FDA under an Emergency Use Authorization (EUA). This EUA will remain  in effect (meaning this test can be used) for the duration of the COVID-19 declaration under Section 564(b)(1) of the Act, 21 U.S.C. section 360bbb-3(b)(1), unless the authorization is terminated or revoked sooner.     Influenza A by PCR NEGATIVE NEGATIVE Final   Influenza B by PCR NEGATIVE NEGATIVE Final    Comment: (NOTE) The Xpert Xpress SARS-CoV-2/FLU/RSV assay is intended as an aid in  the diagnosis of influenza from Nasopharyngeal swab specimens and  should not be used as a sole basis for treatment. Nasal washings and  aspirates are unacceptable for Xpert Xpress SARS-CoV-2/FLU/RSV  testing.  Fact Sheet for Patients: PinkCheek.be  Fact Sheet for Healthcare Providers: GravelBags.it  This test is not yet approved or cleared by the [place] FDA and  has been authorized for detection and/or diagnosis of SARS-CoV-2 by  FDA under an Emergency Use Authorization (EUA). This EUA will remain  in effect (meaning this test can be used) for the duration of the  Covid-19 declaration under Section 564(b)(1) of the Act, 21  U.S.C. section 360bbb-3(b)(1),  unless the authorization is  terminated or revoked. Performed at [facility], [place] [address] [place], [place] [address]      Labs: BNP (last 3 results) Recent Labs    07/15/20 1411 07/16/20 0633 07/17/20 0848  BNP 891.4* 872.3* 716.9*   Basic Metabolic Panel: Recent Labs  Lab 07/16/20 6789 07/17/20 0848 07/18/20 0802 07/19/20 0418 07/20/20 0151  NA 141 141 142 143 141  K 3.9 4.2 4.3 3.8 3.7  CL 107 107 107 107 106  CO2 24 21* 25 27 27   GLUCOSE 222* 182* 151* 149* 208*  BUN 10 7* 6* 9 13  CREATININE 1.13* 1.08* 1.07* 1.23* 1.39*  CALCIUM 8.1* 8.2* 8.5* 8.1* 8.1*  MG 2.2 2.1 2.1 1.8 1.8   Liver Function Tests: Recent Labs  Lab 07/15/20 1411 07/16/20 0633 07/17/20 0848 07/18/20 0802 07/19/20 0418  AST 36 28 31 25 22   ALT 31 28 30 27 23   ALKPHOS 64 54 55 65 58  BILITOT 0.6 0.6 0.4 0.5 0.4  PROT 6.2* 5.3* 5.7* 5.9* 5.6*  ALBUMIN 2.4* 2.0* 2.2* 2.2* 2.1*   No results for input(s): LIPASE, AMYLASE in the last 168 hours. No results for input(s): AMMONIA in the last 168 hours. CBC: Recent Labs  Lab 07/16/20 0633 07/17/20 0848 07/18/20 0802 07/19/20 0418 07/20/20 0151  WBC 4.5 3.8* 4.6 4.2 4.3  NEUTROABS 2.6 2.1 2.3 2.1 2.1  HGB 8.4* 8.7* 9.5* 8.7* 8.4*  HCT 27.3* 28.7* 30.9* 28.1* 26.7*  MCV 96.8 97.0 97.2 95.9 97.1  PLT PLATELET CLUMPS NOTED ON SMEAR, UNABLE TO ESTIMATE 143* 197 204 218   Cardiac Enzymes: No results for input(s): CKTOTAL, CKMB, CKMBINDEX, TROPONINI in the last 168 hours. BNP: Invalid input(s): POCBNP CBG: Recent Labs  Lab 07/19/20 1219 07/19/20 1639 07/19/20 2022 07/20/20 0714 07/20/20 1219  GLUCAP 144* 187* 275* 123* 175*   D-Dimer No results for input(s): DDIMER in the last 72 hours. Hgb A1c No results for input(s): HGBA1C in the last 72 hours. Lipid Profile No results for input(s): CHOL, HDL, LDLCALC,  TRIG, CHOLHDL, LDLDIRECT in the last 72 hours. Thyroid function studies No results for input(s): TSH, T4TOTAL,  T3FREE, THYROIDAB in the last 72 hours.  Invalid input(s): FREET3 Anemia work up No results for input(s): VITAMINB12, FOLATE, FERRITIN, TIBC, IRON, RETICCTPCT in the last 72 hours. Urinalysis    Component Value Date/Time   COLORURINE YELLOW 07/13/2020 1240   APPEARANCEUR CLOUDY (A) 07/13/2020 1240   LABSPEC 1.010 07/13/2020 1240   PHURINE 5.0 07/13/2020 1240   GLUCOSEU NEGATIVE 07/13/2020 1240   HGBUR NEGATIVE 07/13/2020 1240   [place] 07/13/2020 1240   BILIRUBINUR negative 11/28/2019 1206   KETONESUR NEGATIVE 07/13/2020 1240   PROTEINUR NEGATIVE 07/13/2020 1240   UROBILINOGEN 0.2 11/28/2019 1206   UROBILINOGEN 0.2 10/12/2012 2037   NITRITE NEGATIVE 07/13/2020 1240   LEUKOCYTESUR MODERATE (A) 07/13/2020 1240   Sepsis Labs Invalid input(s): PROCALCITONIN,  WBC,  LACTICIDVEN Microbiology Recent Results (from the past 240 hour(s))  SARS Coronavirus 2 by RT PCR (hospital order, performed in [place] [facility] lab) Nasopharyngeal Nasopharyngeal Swab     Status: Abnormal   Collection Time: 07/13/20  9:40 AM   Specimen: Nasopharyngeal Swab  Result Value Ref Range Status   SARS Coronavirus 2 POSITIVE (A) NEGATIVE Final    Comment: emailed L. Berdik RN 11:55 07/13/20 ([email or web address]) (NOTE) SARS-CoV-2 target nucleic acids are DETECTED  SARS-CoV-2 RNA is generally detectable in upper respiratory specimens  during the acute phase of infection.  Positive results are indicative  of the presence of the identified virus, but do not rule out bacterial infection or co-infection with other pathogens not detected by the test.  Clinical correlation with patient history and  other diagnostic information is necessary to determine patient infection status.  The expected result is negative.  Fact Sheet for Patients:   StrictlyIdeas.no   Fact Sheet for Healthcare Providers:   BankingDealers.co.za    This test is not yet approved or cleared  by the [place] FDA and  has been authorized for detection and/or diagnosis of SARS-CoV-2 by FDA under an Emergency Use Authorization (EUA).  This EUA will remain in effect (meaning this test can be used) for the duration of  the  COVID-19 declaration under Section 564(b)(1) of the Act, 21 U.S.C. section 360-bbb-3(b)(1), unless the authorization is terminated or revoked sooner.  Performed at [facility], [place] [address], [place], [place] [address]   Urine culture     Status: None   Collection Time: 07/13/20  1:50 PM   Specimen: Urine, Random  Result Value Ref Range Status   Specimen Description URINE, RANDOM  Final   Special Requests NONE  Final   Culture   Final    NO GROWTH Performed at [facility], [place] [address] [place], [place] [address]    Report Status 07/14/2020 FINAL  Final  Blood culture (routine x 2)     Status: None   Collection Time: 07/13/20  6:50 PM   Specimen: BLOOD  Result Value Ref Range Status   Specimen Description BLOOD RIGHT ANTECUBITAL  Final   Special Requests   Final    BOTTLES DRAWN AEROBIC AND ANAEROBIC Blood Culture results may not be optimal due to an inadequate volume of blood received in culture bottles   Culture   Final    NO GROWTH 5 DAYS Performed at [facility], [place] [address], [place], [place] [address]    Report Status 07/18/2020 FINAL  Final  Blood culture (routine x 2)  Status: None   Collection Time: 07/13/20  6:54 PM   Specimen: BLOOD  Result Value Ref Range Status   Specimen Description BLOOD LEFT ANTECUBITAL  Final   Special Requests   Final    BOTTLES DRAWN AEROBIC AND ANAEROBIC Blood Culture results may not be optimal due to an inadequate volume of blood received in culture bottles   Culture   Final    NO GROWTH 5 DAYS Performed at [facility], [place] [address] [place], [place] [address]    Report Status 07/18/2020 FINAL  Final  Culture, sputum-assessment     Status: None   Collection Time:  07/14/20 12:05 AM   Specimen: Sputum  Result Value Ref Range Status   Specimen Description SPUTUM  Final   Special Requests NONE  Final   Sputum evaluation   Final    THIS SPECIMEN IS ACCEPTABLE FOR SPUTUM CULTURE Performed at [facility], [place] [address] [place], [place] [address]    Report Status 07/14/2020 FINAL  Final  Culture, respiratory     Status: None   Collection Time: 07/14/20 12:05 AM   Specimen: SPU  Result Value Ref Range Status   Specimen Description SPUTUM  Final   Special Requests NONE Reflexed from [id number]  Final   Gram Stain   Final    RARE WBC PRESENT,BOTH PMN AND MONONUCLEAR MODERATE GRAM POSITIVE COCCI IN PAIRS IN CLUSTERS    Culture   Final    RARE Normal respiratory flora-no Staph aureus or Pseudomonas seen Performed at [facility], [place] [address], [place], [place] [address]    Report Status 07/16/2020 FINAL  Final  MRSA PCR Screening     Status: None   Collection Time: 07/14/20  9:25 AM   Specimen: Nasal Mucosa; Nasopharyngeal  Result Value Ref Range Status   MRSA by PCR NEGATIVE NEGATIVE Final    Comment:        The GeneXpert MRSA Assay (FDA approved for NASAL specimens only), is one component of a comprehensive MRSA colonization surveillance program. It is not intended to diagnose MRSA infection nor to guide or monitor treatment for MRSA infections. Performed at [facility], [place] [address], [place], [place] [address]   Respiratory Panel by RT PCR (Flu A&B, Covid) - Nasopharyngeal Swab     Status: None   Collection Time: 07/17/20 12:04 PM   Specimen: Nasopharyngeal Swab  Result Value Ref Range Status   SARS Coronavirus 2 by RT PCR NEGATIVE NEGATIVE Final    Comment: (NOTE) SARS-CoV-2 target nucleic acids are NOT DETECTED.  The SARS-CoV-2 RNA is generally detectable in upper respiratoy specimens during the acute phase of infection. The lowest concentration of SARS-CoV-2 viral copies this assay can detect is 131  copies/mL. A negative result does not preclude SARS-Cov-2 infection and should not be used as the sole basis for treatment or other patient management decisions. A negative result may occur with  improper specimen collection/handling, submission of specimen other than nasopharyngeal swab, presence of viral mutation(s) within the areas targeted by this assay, and inadequate number of viral copies (<131 copies/mL). A negative result must be combined with clinical observations, patient history, and epidemiological information. The expected result is Negative.  Fact Sheet for Patients:  PinkCheek.be  Fact Sheet for Healthcare Providers:  GravelBags.it  This test is no t yet approved or cleared by the [place] FDA and  has been authorized for detection and/or diagnosis of SARS-CoV-2 by FDA under an  Emergency Use Authorization (EUA). This EUA will remain  in effect (meaning this test can be used) for the duration of the COVID-19 declaration under Section 564(b)(1) of the Act, 21 U.S.C. section 360bbb-3(b)(1), unless the authorization is terminated or revoked sooner.     Influenza A by PCR NEGATIVE NEGATIVE Final   Influenza B by PCR NEGATIVE NEGATIVE Final    Comment: (NOTE) The Xpert Xpress SARS-CoV-2/FLU/RSV assay is intended as an aid in  the diagnosis of influenza from Nasopharyngeal swab specimens and  should not be used as a sole basis for treatment. Nasal washings and  aspirates are unacceptable for Xpert Xpress SARS-CoV-2/FLU/RSV  testing.  Fact Sheet for Patients: PinkCheek.be  Fact Sheet for Healthcare Providers: GravelBags.it  This test is not yet approved or cleared by the [place] FDA and  has been authorized for detection and/or diagnosis of SARS-CoV-2 by  FDA under an Emergency Use Authorization (EUA). This EUA will remain  in effect (meaning  this test can be used) for the duration of the  Covid-19 declaration under Section 564(b)(1) of the Act, 21  U.S.C. section 360bbb-3(b)(1), unless the authorization is  terminated or revoked. Performed at [facility], [place] [address] [place], [place] [address]   Respiratory Panel by RT PCR (Flu A&B, Covid) - Nasopharyngeal Swab     Status: None   Collection Time: 07/20/20  1:02 PM   Specimen: Nasopharyngeal Swab  Result Value Ref Range Status   SARS Coronavirus 2 by RT PCR NEGATIVE NEGATIVE Final    Comment: (NOTE) SARS-CoV-2 target nucleic acids are NOT DETECTED.  The SARS-CoV-2 RNA is generally detectable in upper respiratoy specimens during the acute phase of infection. The lowest concentration of SARS-CoV-2 viral copies this assay can detect is 131 copies/mL. A negative result does not preclude SARS-Cov-2 infection and should not be used as the sole basis for treatment or other patient management decisions. A negative result may occur with  improper specimen collection/handling, submission of specimen other than nasopharyngeal swab, presence of viral mutation(s) within the areas targeted by this assay, and inadequate number of viral copies (<131 copies/mL). A negative result must be combined with clinical observations, patient history, and epidemiological information. The expected result is Negative.  Fact Sheet for Patients:  PinkCheek.be  Fact Sheet for Healthcare Providers:  GravelBags.it  This test is no t yet approved or cleared by the [place] FDA and  has been authorized for detection and/or diagnosis of SARS-CoV-2 by FDA under an Emergency Use Authorization (EUA). This EUA will remain  in effect (meaning this test can be used) for the duration of the COVID-19 declaration under Section 564(b)(1) of the Act, 21 U.S.C. section 360bbb-3(b)(1), unless the authorization is terminated or revoked  sooner.     Influenza A by PCR NEGATIVE NEGATIVE Final   Influenza B by PCR NEGATIVE NEGATIVE Final    Comment: (NOTE) The Xpert Xpress SARS-CoV-2/FLU/RSV assay is intended as an aid in  the diagnosis of influenza from Nasopharyngeal swab specimens and  should not be used as a sole basis for treatment. Nasal washings and  aspirates are unacceptable for Xpert Xpress SARS-CoV-2/FLU/RSV  testing.  Fact Sheet for Patients: PinkCheek.be  Fact Sheet for Healthcare Providers: GravelBags.it  This test is not yet approved or cleared by the [place] FDA and  has been authorized for detection and/or diagnosis of SARS-CoV-2 by  FDA under an Emergency Use Authorization (EUA). This EUA will remain  in effect (meaning this test can  be used) for the duration of the  Covid-19 declaration under Section 564(b)(1) of the Act, 21  U.S.C. section 360bbb-3(b)(1), unless the authorization is  terminated or revoked. Performed at [facility], [place] [address] [place], [place] [address]      Time coordinating discharge: 35 minutes  SIGNED:   Aline August, MD  [facility] 07/20/2020, 2:28 PM

## 2020-07-20 NOTE — Progress Notes (Signed)
Attempted to call for report. No response noted.

## 2020-07-20 NOTE — Progress Notes (Addendum)
Progress Note  Patient Name: [name] Date of Encounter: 07/20/2020  [facility] Cardiologist: Larae Grooms, MD   Subjective   Denies any chest pain or SOB.   Inpatient Medications    Scheduled Meds: . amiodarone  200 mg Oral Daily  . anastrozole  1 mg Oral Daily  . apixaban  2.5 mg Oral BID  . atorvastatin  20 mg Oral QHS  . cholecalciferol  1,000 Units Oral Daily  . feeding supplement (GLUCERNA SHAKE)  237 mL Oral BID BM  . ferrous sulfate  325 mg Oral Q breakfast  . insulin aspart  0-9 Units Subcutaneous TID WC  . insulin detemir  15 Units Subcutaneous QHS  . metoprolol succinate  25 mg Oral Daily  . pantoprazole  40 mg Oral Daily  . pregabalin  75 mg Oral QHS  . torsemide  20 mg Oral Daily  . vitamin B-12  1,000 mcg Oral Daily   Continuous Infusions: . ampicillin-sulbactam (UNASYN) IV 3 g (07/20/20 0219)   PRN Meds: acetaminophen **OR** acetaminophen, alum & mag hydroxide-simeth, guaiFENesin-dextromethorphan, ipratropium-albuterol, loperamide, [DISCONTINUED] ondansetron **OR** ondansetron (ZOFRAN) IV, Resource ThickenUp Clear, traMADol   Vital Signs    Vitals:   07/19/20 1942 07/19/20 2355 07/20/20 0624 07/20/20 0711  BP: 120/66 116/68 135/65 127/85  Pulse:   63 71  Resp:   (!) 21 20  Temp: 98.7 F (37.1 C) 98.4 F (36.9 C) 98.3 F (36.8 C) 97.9 F (36.6 C)  TempSrc: Oral Oral Oral Oral  SpO2:   99% 100%  Weight:   87.7 kg   Height:        Intake/Output Summary (Last 24 hours) at 07/20/2020 0827 Last data filed at 07/19/2020 2045 Gross per 24 hour  Intake 837 ml  Output 1100 ml  Net -263 ml   Last 3 Weights 07/20/2020 07/19/2020 07/18/2020  Weight (lbs) 193 lb 5.5 oz 195 lb 12.3 oz 195 lb 12.3 oz  Weight (kg) 87.7 kg 88.8 kg 88.8 kg      Telemetry    NSR with  PACs and V paced rhythm - Personally Reviewed  Physical Exam   GEN: Well nourished, well developed in no acute distress HEENT: Normal NECK: No JVD; No carotid  bruits LYMPHATICS: No lymphadenopathy CARDIAC:RRR, no murmurs, rubs, gallops RESPIRATORY:  Clear to auscultation without rales, wheezing or rhonchi  ABDOMEN: Soft, non-tender, non-distended MUSCULOSKELETAL:  No edema; No deformity  SKIN: Warm and dry NEUROLOGIC:  Alert and oriented x 3 PSYCHIATRIC:  Normal affect    Labs    High Sensitivity Troponin:   Recent Labs  Lab 06/22/20 1735 07/13/20 0934 07/13/20 1213 07/13/20 1836 07/14/20 0020  TROPONINIHS 70* 41* 52* 466* 454*      Chemistry Recent Labs  Lab 07/17/20 0848 07/17/20 0848 07/18/20 0802 07/19/20 0418 07/20/20 0151  NA 141   < > 142 143 141  K 4.2   < > 4.3 3.8 3.7  CL 107   < > 107 107 106  CO2 21*   < > 25 27 27   GLUCOSE 182*   < > 151* 149* 208*  BUN 7*   < > 6* 9 13  CREATININE 1.08*   < > 1.07* 1.23* 1.39*  CALCIUM 8.2*   < > 8.5* 8.1* 8.1*  PROT 5.7*  --  5.9* 5.6*  --   ALBUMIN 2.2*  --  2.2* 2.1*  --   AST 31  --  25 22  --   ALT 30  --  27 23  --   ALKPHOS 55  --  65 58  --   BILITOT 0.4  --  0.5 0.4  --   GFRNONAA 47*   < > 47* 40* 34*  GFRAA 54*   < > 55* 46* 40*  ANIONGAP 13   < > 10 9 8    < > = values in this interval not displayed.     Hematology Recent Labs  Lab 07/18/20 0802 07/19/20 0418 07/20/20 0151  WBC 4.6 4.2 4.3  RBC 3.18* 2.93* 2.75*  HGB 9.5* 8.7* 8.4*  HCT 30.9* 28.1* 26.7*  MCV 97.2 95.9 97.1  MCH 29.9 29.7 30.5  MCHC 30.7 31.0 31.5  RDW 14.2 14.3 14.6  PLT 197 204 218    BNP Recent Labs  Lab 07/15/20 1411 07/16/20 0633 07/17/20 0848  BNP 891.4* 872.3* 856.6*     Radiology    DG CHEST PORT 1 VIEW  Result Date: 07/18/2020 CLINICAL DATA:  Dyspnea EXAM: PORTABLE CHEST 1 VIEW COMPARISON:  07/13/2020 FINDINGS: Similar enlargement the cardiac silhouette. Stable cardiac rhythm maintenance device. Similar pulmonary vascular congestion. Slightly improved bilateral interstitial ground-glass opacities. No visible pleural effusions. No pneumothorax. No acute  osseous abnormality. IMPRESSION: Slightly improved bilateral interstitial and ground-glass opacities, which may represent pulmonary edema (favored) and or multifocal pneumonia. Electronically Signed   By: Margaretha Sheffield MD   On: 07/18/2020 13:49    Patient Profile     86 y.o. female with past medical history of hypertension, SVT on amiodarone, chronic stage III kidney disease, chronic systolic congestive heart failure, recent Covid infection admitted with hypoglycemia and probable aspiration pneumonia for evaluation of elevated troponin.  Echocardiogram August 31 showed ejection fraction 01%, grade 1 diastolic dysfunction, severe left atrial enlargement.  Assessment & Plan    1.Elevated troponin -likely demand ischemia in the setting of aspiration pneumonia.   -she has not had any chest pain other than with cough   -consider outpatient nuclear stress test once she recovers from PNA but she is now DNR and Palliative Care is following so would have her followup with Dr. Irish Lack first to determine how aggressive he wants to be with workup -No plans for further inpatient ischemia evaluation.  2. Cardiomyopathy -LV function newly reduced in the setting of recent Covid infection.   -as above consider [place] nuclear study following discharge per Dr. Hassell Done evaluation at [facility].   -BP is controlled -continue Toprol 25 mg daily.   3 Aspiration pneumonia -continue antibiotics per primary care.  4. PPM -she will follow up with Dr. Lovena Le following discharge.  5.  SVT -remains in NSR with PACs on tele and V pacing -patient on chronic amiodarone.   -continue Amio 200mg  daily  6. history of DVT  -on chronic anticoagulation -continue apixaban -Recommend TRH review with PHarmacy as patient has a SCr <1.5 and weight > 60kg so should be on 5mg  BID  I have spent a total of 35 minutes with patient reviewing 2D echo , telemetry, EKGs, labs and examining patient as well as establishing an  assessment and plan that was discussed with the patient.  > 50% of time was spent in direct patient care.    [facility] will sign off.   Medication Recommendations:  Amiodarone 200mg  daily, Toprol XL 25mg  daily and Torsemide 20mg  daily Other recommendations (labs, testing, etc):  None at this time until seen by Dr. Irish Lack Follow up as an outpatient:  Followup with Dr. Irish Lack   For questions or  updates, please contact [place] Please consult [email or web address] for contact info under        Signed, Fransico Him, MD  07/20/2020, 8:27 AM

## 2020-07-20 NOTE — Progress Notes (Signed)
   07/20/20 2310  AVS Discharge Documentation  Name of Person Receiving AVS Discharge Instructions Including Medications PTAR  Name of Clinician That Reviewed AVS Discharge Instructions Including Medications Henderson Newcomer RN

## 2020-07-20 NOTE — Progress Notes (Signed)
Patient ID: [name], female   DOB: 1933-08-12, 86 y.o.   MRN: [id number]  PROGRESS NOTE    [name]  KZS:[id number] DOB: 01/26/1933 DOA: 07/13/2020 PCP: [name], MD   Brief Narrative:  86 year old female with history of diabetes mellitus type 2, breast cancer on anastrozole, SVT status post ablation with permanent pacemaker, chronic combined heart failure, chronic kidney disease stage IIIb, unspecified stroke, recurrent DVT on Eliquis, recent COVID-19 pneumonia and discharged to a nursing home presented with very low blood sugars at nursing home along with poor appetite and brief.  Of unresponsiveness.  On presentation, CT of the head was negative for any acute abnormality.  She was noted to be slightly hypoxic with mild fever and there was a suspicion for aspiration pneumonia for which she was started on antibiotics.   Assessment & Plan:   Possible aspiration pneumonia hypoxia -Currently on Unasyn, today's day #7.  DC after today's doses. -Currently on room air. -Diet as per SLP recommendations.  Cultures negative so far -Afebrile over the last several days.  Acute metabolic encephalopathy -Probably from pneumonia and hypoglycemia. -Mental status improving.  Continue neurochecks.  Fall precautions.  PT recommends SNF placement.  Recent fully treated COVID-19 pneumonia -Patient had a recent hospitalization and was treated for COVID-19 pneumonia. -Incentive spirometry.  Hypertension -Blood pressure stable.  Continue beta-blocker.    Diabetes mellitus type 2 uncontrolled with hypoglycemia/hyperglycemia -Presented with hypoglycemia and altered mental status probably from poor oral intake.  A1c 9.0.  Blood sugars are still fluctuating.  Started Levemir on 07/13/2020.  Continue Levemir 15 units nightly. Continue CBGs with SSI.  Dehydration -Improving.  Off IV fluids.  AKI on CKD stage IIIb -Baseline creatinine 1.5.  Creatinine 1.39 today.  Chronic combined CHF -EF of  30% on most recent echocardiogram. Strict input output.  Daily weights.  No signs of fluid overload -Cardiology evaluation appreciated.  Continue Toprol. -Torsemide resumed on 07/18/2020 at 20 mg daily.  Positive troponins -Probably from demand ischemia.  Cardiology following and recommending no plans for further inpatient ischemic evaluation.  History of DVT -Continue Eliquis  Prior unspecified CVA -Continue statin and Eliquis  History of SVT/pacemaker placement -On amiodarone as an outpatient for possible?  SVT.  Continue amiodarone.  Outpatient follow-up with cardiology  Chronic weakness and deconditioning -Wheelchair-bound at baseline.  Currently from SNF.  Social worker following for placement to SNF -Palliative care following.  CODE STATUS has been changed to DNR per palliative care team.  DVT prophylaxis: Eliquis Code Status: DNR Family Communication: Spoke to daughter & granddaughter on phone on 07/15/2020 disposition Plan: Status is: Inpatient  Remains inpatient appropriate because:Inpatient level of care appropriate due to severity of illness.  Discharge to SNF once bed is available.   Dispo: The patient is from: SNF              Anticipated d/c is to: SNF              Anticipated d/c date is: 1 day              Patient currently is medically stable to d/c.   Consultants: Cardiology/palliative care Procedures: None  Antimicrobials:  Anti-infectives (From admission, onward)   Start     Dose/Rate Route Frequency Ordered Stop   07/16/20 0815  Ampicillin-Sulbactam (UNASYN) 3 g in sodium chloride 0.9 % 100 mL IVPB        3 g 200 mL/hr over 30 Minutes Intravenous Every 6 hours 07/16/20 0813  07/20/20 2359   07/14/20 1900  cefTRIAXone (ROCEPHIN) 1 g in sodium chloride 0.9 % 100 mL IVPB  Status:  Discontinued        1 g 200 mL/hr over 30 Minutes Intravenous Every 24 hours 07/13/20 2002 07/14/20 0924   07/14/20 1800  azithromycin (ZITHROMAX) 500 mg in sodium chloride 0.9 %  250 mL IVPB  Status:  Discontinued        500 mg 250 mL/hr over 60 Minutes Intravenous Every 24 hours 07/13/20 2010 07/14/20 0924   07/14/20 1000  Ampicillin-Sulbactam (UNASYN) 3 g in sodium chloride 0.9 % 100 mL IVPB  Status:  Discontinued        3 g 200 mL/hr over 30 Minutes Intravenous Every 12 hours 07/14/20 0928 07/16/20 0813   07/13/20 1815  cefTRIAXone (ROCEPHIN) 1 g in sodium chloride 0.9 % 100 mL IVPB        1 g 200 mL/hr over 30 Minutes Intravenous  Once 07/13/20 1803 07/13/20 2003   07/13/20 1815  azithromycin (ZITHROMAX) 500 mg in sodium chloride 0.9 % 250 mL IVPB        500 mg 250 mL/hr over 60 Minutes Intravenous  Once 07/13/20 1803 07/13/20 2114       Subjective: Patient seen and examined at bedside.  Poor historian.  Denies worsening abdominal pain, fever, vomiting, diarrhea. Feels better. Objective: Vitals:   07/19/20 1942 07/19/20 2355 07/20/20 0624 07/20/20 0711  BP: 120/66 116/68 135/65 127/85  Pulse:   63 71  Resp:   (!) 21 20  Temp: 98.7 F (37.1 C) 98.4 F (36.9 C) 98.3 F (36.8 C) 97.9 F (36.6 C)  TempSrc: Oral Oral Oral Oral  SpO2:   99% 100%  Weight:   87.7 kg   Height:        Intake/Output Summary (Last 24 hours) at 07/20/2020 0759 Last data filed at 07/19/2020 2045 Gross per 24 hour  Intake 837 ml  Output 1100 ml  Net -263 ml   Filed Weights   07/18/20 0504 07/19/20 0446 07/20/20 0624  Weight: 88.8 kg 88.8 kg 87.7 kg    Examination:  General exam: No distress.  Currently still on room air.  Chronically ill looking female lying on bed.  Poor historian.  Still slow to respond to questions. Respiratory system: Bilateral decreased breath sounds at bases with some crackles.  No wheezing  cardiovascular system: Rate controlled, S1-S2 heard Gastrointestinal system: Abdomen is nondistended, soft and nontender.  Bowel sounds heard  extremities: Trace lower extremity edema present.  No clubbing   Data Reviewed: I have personally reviewed  following labs and imaging studies  CBC: Recent Labs  Lab 07/16/20 0633 07/17/20 0848 07/18/20 0802 07/19/20 0418 07/20/20 0151  WBC 4.5 3.8* 4.6 4.2 4.3  NEUTROABS 2.6 2.1 2.3 2.1 2.1  HGB 8.4* 8.7* 9.5* 8.7* 8.4*  HCT 27.3* 28.7* 30.9* 28.1* 26.7*  MCV 96.8 97.0 97.2 95.9 97.1  PLT PLATELET CLUMPS NOTED ON SMEAR, UNABLE TO ESTIMATE 143* 197 204 536   Basic Metabolic Panel: Recent Labs  Lab 07/16/20 0633 07/17/20 0848 07/18/20 0802 07/19/20 0418 07/20/20 0151  NA 141 141 142 143 141  K 3.9 4.2 4.3 3.8 3.7  CL 107 107 107 107 106  CO2 24 21* 25 27 27   GLUCOSE 222* 182* 151* 149* 208*  BUN 10 7* 6* 9 13  CREATININE 1.13* 1.08* 1.07* 1.23* 1.39*  CALCIUM 8.1* 8.2* 8.5* 8.1* 8.1*  MG 2.2 2.1 2.1 1.8 1.8  GFR: Estimated Creatinine Clearance: 32.4 mL/min (A) (by C-G formula based on SCr of 1.39 mg/dL (H)). Liver Function Tests: Recent Labs  Lab 07/15/20 1411 07/16/20 0633 07/17/20 0848 07/18/20 0802 07/19/20 0418  AST 36 28 31 25 22   ALT 31 28 30 27 23   ALKPHOS 64 54 55 65 58  BILITOT 0.6 0.6 0.4 0.5 0.4  PROT 6.2* 5.3* 5.7* 5.9* 5.6*  ALBUMIN 2.4* 2.0* 2.2* 2.2* 2.1*   No results for input(s): LIPASE, AMYLASE in the last 168 hours. No results for input(s): AMMONIA in the last 168 hours. Coagulation Profile: Recent Labs  Lab 07/13/20 0934  INR 1.1   Cardiac Enzymes: No results for input(s): CKTOTAL, CKMB, CKMBINDEX, TROPONINI in the last 168 hours. BNP (last 3 results) No results for input(s): PROBNP in the last 8760 hours. HbA1C: No results for input(s): HGBA1C in the last 72 hours. CBG: Recent Labs  Lab 07/19/20 0733 07/19/20 1219 07/19/20 1639 07/19/20 2022 07/20/20 0714  GLUCAP 87 144* 187* 275* 123*   Lipid Profile: No results for input(s): CHOL, HDL, LDLCALC, TRIG, CHOLHDL, LDLDIRECT in the last 72 hours. Thyroid Function Tests: No results for input(s): TSH, T4TOTAL, FREET4, T3FREE, THYROIDAB in the last 72 hours. Anemia Panel: No  results for input(s): VITAMINB12, FOLATE, FERRITIN, TIBC, IRON, RETICCTPCT in the last 72 hours. Sepsis Labs: Recent Labs  Lab 07/13/20 1213 07/13/20 1850 07/13/20 2116 07/13/20 2316 07/14/20 0900  PROCALCITON  --   --   --   --  8.15  LATICACIDVEN 1.1 1.7 0.5 1.9  --     Recent Results (from the past 240 hour(s))  SARS Coronavirus 2 by RT PCR (hospital order, performed in [facility] hospital lab) Nasopharyngeal Nasopharyngeal Swab     Status: Abnormal   Collection Time: 07/13/20  9:40 AM   Specimen: Nasopharyngeal Swab  Result Value Ref Range Status   SARS Coronavirus 2 POSITIVE (A) NEGATIVE Final    Comment: emailed L. Berdik RN 11:55 07/13/20 ([email or web address]) (NOTE) SARS-CoV-2 target nucleic acids are DETECTED  SARS-CoV-2 RNA is generally detectable in upper respiratory specimens  during the acute phase of infection.  Positive results are indicative  of the presence of the identified virus, but do not rule out bacterial infection or co-infection with other pathogens not detected by the test.  Clinical correlation with patient history and  other diagnostic information is necessary to determine patient infection status.  The expected result is negative.  Fact Sheet for Patients:   StrictlyIdeas.no   Fact Sheet for Healthcare Providers:   BankingDealers.co.za    This test is not yet approved or cleared by the [place] FDA and  has been authorized for detection and/or diagnosis of SARS-CoV-2 by FDA under an Emergency Use Authorization (EUA).  This EUA will remain in effect (meaning this test can be used) for the duration of  the  COVID-19 declaration under Section 564(b)(1) of the Act, 21 U.S.C. section 360-bbb-3(b)(1), unless the authorization is terminated or revoked sooner.  Performed at [facility], [place] [address], [place], [place] [address]   Urine culture     Status: None   Collection Time: 07/13/20  1:50 PM    Specimen: Urine, Random  Result Value Ref Range Status   Specimen Description URINE, RANDOM  Final   Special Requests NONE  Final   Culture   Final    NO GROWTH Performed at [facility], [place] [address] [place], [place] [address]    Report Status  07/14/2020 FINAL  Final  Blood culture (routine x 2)     Status: None   Collection Time: 07/13/20  6:50 PM   Specimen: BLOOD  Result Value Ref Range Status   Specimen Description BLOOD RIGHT ANTECUBITAL  Final   Special Requests   Final    BOTTLES DRAWN AEROBIC AND ANAEROBIC Blood Culture results may not be optimal due to an inadequate volume of blood received in culture bottles   Culture   Final    NO GROWTH 5 DAYS Performed at [facility], [place] [address], [place], [place] [address]    Report Status 07/18/2020 FINAL  Final  Blood culture (routine x 2)     Status: None   Collection Time: 07/13/20  6:54 PM   Specimen: BLOOD  Result Value Ref Range Status   Specimen Description BLOOD LEFT ANTECUBITAL  Final   Special Requests   Final    BOTTLES DRAWN AEROBIC AND ANAEROBIC Blood Culture results may not be optimal due to an inadequate volume of blood received in culture bottles   Culture   Final    NO GROWTH 5 DAYS Performed at [facility], [place] [address], [place], [place] [address]    Report Status 07/18/2020 FINAL  Final  Culture, sputum-assessment     Status: None   Collection Time: 07/14/20 12:05 AM   Specimen: Sputum  Result Value Ref Range Status   Specimen Description SPUTUM  Final   Special Requests NONE  Final   Sputum evaluation   Final    THIS SPECIMEN IS ACCEPTABLE FOR SPUTUM CULTURE Performed at [facility], [place] [address] [place], [place] [address]    Report Status 07/14/2020 FINAL  Final  Culture, respiratory     Status: None   Collection Time: 07/14/20 12:05 AM   Specimen: SPU  Result Value Ref Range Status   Specimen Description SPUTUM  Final   Special Requests NONE Reflexed from  [id number]  Final   Gram Stain   Final    RARE WBC PRESENT,BOTH PMN AND MONONUCLEAR MODERATE GRAM POSITIVE COCCI IN PAIRS IN CLUSTERS    Culture   Final    RARE Normal respiratory flora-no Staph aureus or Pseudomonas seen Performed at [facility], [place] [address], [place], [place] [address]    Report Status 07/16/2020 FINAL  Final  MRSA PCR Screening     Status: None   Collection Time: 07/14/20  9:25 AM   Specimen: Nasal Mucosa; Nasopharyngeal  Result Value Ref Range Status   MRSA by PCR NEGATIVE NEGATIVE Final    Comment:        The GeneXpert MRSA Assay (FDA approved for NASAL specimens only), is one component of a comprehensive MRSA colonization surveillance program. It is not intended to diagnose MRSA infection nor to guide or monitor treatment for MRSA infections. Performed at [facility], [place] [address] [place], [place] [address]   Respiratory Panel by RT PCR (Flu A&B, Covid) - Nasopharyngeal Swab     Status: None   Collection Time: 07/17/20 12:04 PM   Specimen: Nasopharyngeal Swab  Result Value Ref Range Status   SARS Coronavirus 2 by RT PCR NEGATIVE NEGATIVE Final    Comment: (NOTE) SARS-CoV-2 target nucleic acids are NOT DETECTED.  The SARS-CoV-2 RNA is generally detectable in upper respiratoy specimens during the acute phase of infection. The lowest concentration of SARS-CoV-2 viral copies this assay can detect is 131 copies/mL. A negative result does  not preclude SARS-Cov-2 infection and should not be used as the sole basis for treatment or other patient management decisions. A negative result may occur with  improper specimen collection/handling, submission of specimen other than nasopharyngeal swab, presence of viral mutation(s) within the areas targeted by this assay, and inadequate number of viral copies (<131 copies/mL). A negative result must be combined with clinical observations, patient history, and epidemiological information. The expected  result is Negative.  Fact Sheet for Patients:  PinkCheek.be  Fact Sheet for Healthcare Providers:  GravelBags.it  This test is no t yet approved or cleared by the [place] FDA and  has been authorized for detection and/or diagnosis of SARS-CoV-2 by FDA under an Emergency Use Authorization (EUA). This EUA will remain  in effect (meaning this test can be used) for the duration of the COVID-19 declaration under Section 564(b)(1) of the Act, 21 U.S.C. section 360bbb-3(b)(1), unless the authorization is terminated or revoked sooner.     Influenza A by PCR NEGATIVE NEGATIVE Final   Influenza B by PCR NEGATIVE NEGATIVE Final    Comment: (NOTE) The Xpert Xpress SARS-CoV-2/FLU/RSV assay is intended as an aid in  the diagnosis of influenza from Nasopharyngeal swab specimens and  should not be used as a sole basis for treatment. Nasal washings and  aspirates are unacceptable for Xpert Xpress SARS-CoV-2/FLU/RSV  testing.  Fact Sheet for Patients: PinkCheek.be  Fact Sheet for Healthcare Providers: GravelBags.it  This test is not yet approved or cleared by the [place] FDA and  has been authorized for detection and/or diagnosis of SARS-CoV-2 by  FDA under an Emergency Use Authorization (EUA). This EUA will remain  in effect (meaning this test can be used) for the duration of the  Covid-19 declaration under Section 564(b)(1) of the Act, 21  U.S.C. section 360bbb-3(b)(1), unless the authorization is  terminated or revoked. Performed at [facility], [place] [address] [place], [place] [address]          Radiology Studies: DG CHEST PORT 1 VIEW  Result Date: 07/18/2020 CLINICAL DATA:  Dyspnea EXAM: PORTABLE CHEST 1 VIEW COMPARISON:  07/13/2020 FINDINGS: Similar enlargement the cardiac silhouette. Stable cardiac rhythm maintenance device. Similar pulmonary  vascular congestion. Slightly improved bilateral interstitial ground-glass opacities. No visible pleural effusions. No pneumothorax. No acute osseous abnormality. IMPRESSION: Slightly improved bilateral interstitial and ground-glass opacities, which may represent pulmonary edema (favored) and or multifocal pneumonia. Electronically Signed   By: Margaretha Sheffield MD   On: 07/18/2020 13:49        Scheduled Meds: . amiodarone  200 mg Oral Daily  . anastrozole  1 mg Oral Daily  . apixaban  2.5 mg Oral BID  . atorvastatin  20 mg Oral QHS  . cholecalciferol  1,000 Units Oral Daily  . feeding supplement (GLUCERNA SHAKE)  237 mL Oral BID BM  . ferrous sulfate  325 mg Oral Q breakfast  . insulin aspart  0-9 Units Subcutaneous TID WC  . insulin detemir  15 Units Subcutaneous QHS  . metoprolol succinate  25 mg Oral Daily  . pantoprazole  40 mg Oral Daily  . pregabalin  75 mg Oral QHS  . torsemide  20 mg Oral Daily  . vitamin B-12  1,000 mcg Oral Daily   Continuous Infusions: . ampicillin-sulbactam (UNASYN) IV 3 g (07/20/20 0219)          Aline August, MD [facility] 07/20/2020, 7:59 AM

## 2020-07-20 NOTE — Progress Notes (Signed)
Physical Therapy Treatment Patient Details Name: [name] MRN: [id number] DOB: 05/02/1933 Today's Date: 07/20/2020    History of Present Illness 86 year old female with history of diabetes mellitus type 2, breast cancer on anastrozole, SVT status post ablation with permanent pacemaker, chronic combined heart failure, chronic kidney disease stage IIIb, unspecified stroke, recurrent DVT on Eliquis, recentCOVID-19 pneumonia and discharged to a nursing home presented with very low blood sugars at nursing home along with poor appetite and brief.  Of unresponsiveness.  On presentation, CT of the head was negative for any acute abnormality.  She was noted to be slightly hypoxic with mild fever and there was a suspicion for aspiration pneumonia for which she was started on antibiotics.    PT Comments    Pt participated in session; pt limited by poor endurance during session; pt demonstrating improved sit<>stand pushing up from chair; pt continues to demonstrate deficits in strength; balance, coordination and transfers; pt will benefit from skilled PT to address deficits to maximize independence with functional mobility prior to discharge.     Follow Up Recommendations  SNF     Equipment Recommendations       Recommendations for Other Services       Precautions / Restrictions Precautions Precautions: Fall;Other (comment) Restrictions Weight Bearing Restrictions: No    Mobility  Bed Mobility               General bed mobility comments: pt in chair  Transfers Overall transfer level: Needs assistance Equipment used: Rolling walker (2 wheeled) Transfers: Sit to/from Stand Sit to Stand: Min assist;Mod assist         General transfer comment: pt performed 3 trials sit<>stand; initial trial mod A with 1 UE on RW and one pushing up from chair; 2nd and 3rd trials min A with pushing up wtih both hands from chair  Ambulation/Gait                 Stairs              Wheelchair Mobility    Modified Rankin (Stroke Patients Only)       Balance                                            Cognition Arousal/Alertness: Awake/alert Behavior During Therapy: WFL for tasks assessed/performed Overall Cognitive Status: Within Functional Limits for tasks assessed                                 General Comments: pt states she is ready to go back to rehab      Exercises General Exercises - Lower Extremity Ankle Circles/Pumps: AROM;Both;20 reps;Seated Long Arc Quad: AROM;Both;20 reps; 2 sets;Seated Hip Flexion/Marching: AROM;Both;20 reps;Seated    General Comments        Pertinent Vitals/Pain Pain Assessment: No/denies pain    Home Living                      Prior Function            PT Goals (current goals can now be found in the care plan section) Acute Rehab PT Goals Patient Stated Goal: to go home PT Goal Formulation: With patient Time For Goal Achievement: 07/29/20 Potential to Achieve Goals: Fair Additional Goals Additional Goal #1: Pt  will I set up for transfers and propel wheelchair 25 ft to safely mobilize around her house Progress towards PT goals: Progressing toward goals    Frequency    Min 2X/week      PT Plan Current plan remains appropriate    [name]-evaluation              AM-PAC PT "6 Clicks" Mobility   Outcome Measure  Help needed turning from your back to your side while in a flat bed without using bedrails?: A Little Help needed moving from lying on your back to sitting on the side of a flat bed without using bedrails?: A Little Help needed moving to and from a bed to a chair (including a wheelchair)?: A Little Help needed standing up from a chair using your arms (e.g., wheelchair or bedside chair)?: A Little Help needed to walk in hospital room?: Total Help needed climbing 3-5 steps with a railing? : Total 6 Click Score: 14    End of Session Equipment  Utilized During Treatment: Gait belt Activity Tolerance: Patient tolerated treatment well;Patient limited by fatigue Patient left: in chair;with call bell/phone within reach Nurse Communication: Mobility status PT Visit Diagnosis: Unsteadiness on feet (R26.81);Other abnormalities of gait and mobility (R26.89);Muscle weakness (generalized) (M62.81)     Time: 6967-8938 PT Time Calculation (min) (ACUTE ONLY): 17 min  Charges:  $Therapeutic Exercise: 8-22 mins                     [name], DPT Acute Rehabilitation Services [phone]   [name] 07/20/2020, 12:40 PM

## 2020-07-20 NOTE — Progress Notes (Signed)
Spoke with Helane Rima at [facility] and report was given.

## 2020-07-20 NOTE — TOC Transition Note (Addendum)
Transition of Care [facility]) - CM/SW Discharge Note   Patient Details  Name: [name] MRN: [id number] Date of Birth: 22-Jul-1933  Transition of Care [facility]) CM/SW Contact:  Trula Ore, Center Phone Number: 07/20/2020, 4:45 PM   Clinical Narrative:     Patient will DC to: [place]  Anticipated DC date: 07/20/2020  Family notified: [name]  Transport [facility]  ?  Per MD patient ready for DC to [facility] . RN, patient, patient's family, and facility notified of DC. Discharge Summary sent to facility. RN given number for report tele# [phone] [phone]. DC packet on chart. Ambulance transport requested for patient.DNR on chart signed by MD.  CSW signing off.  Final next level of care: Skilled Nursing Facility Barriers to Discharge: No Barriers Identified   Patient Goals and CMS Choice Patient states their goals for this hospitalization and ongoing recovery are:: to go to SNF CMS Medicare.gov Compare Post Acute Care list provided to:: Patient Represenative (must comment) [name]) Choice offered to / list presented to : Adult Children [name])  Discharge Placement              Patient chooses bed at:  [facility]) Patient to be transferred to facility by: [place] Name of family member notified: [name] Patient and family notified of of transfer: 07/20/20  Discharge Plan and Services                                     Social Determinants of Health (SDOH) Interventions     Readmission Risk Interventions Readmission Risk Prevention Plan 01/03/2019 01/02/2019  Transportation Screening (No Data) Complete  PCP or Specialist Appt within 3-5 Days - Complete  HRI or [place] - Complete  Social Work Consult for [place] Planning/Counseling - Complete  Palliative Care Screening - Not Applicable  Medication Review Press photographer) - Complete  Some recent data might be hidden

## 2020-07-20 NOTE — Progress Notes (Signed)
  Speech Language Pathology Treatment: Dysphagia  Patient Details Name: [name] MRN: [id number] DOB: 03-Jan-1933 Today's Date: 07/20/2020 Time: 1829-9371 SLP Time Calculation (min) (ACUTE ONLY): 14 min  Assessment / Plan / Recommendation Clinical Impression  Pt was seen for dysphagia treatment and was cooperative throughout the session. Pt and nursing reported that the pt has been tolerating the current diet without overt s/sx of aspiration. Pt stated that pineapple and foods that consistency are still too hard for her to masticate but that she has been tolerating all other solids without difficulty. Pt tolerated dysphagia 3 solids, mixed consistency boluses (mechanical soft with thin liquids), and thin liquids via straw using individual and consecutive swallows without symptoms of oropharyngeal dysphagia. It is recommended that the current diet be continued. Further skilled SLP services are not clinically indicated at this time.    HPI HPI: 86yo female admitted 07/13/20. PMH: DM2,  history of breast cancer on anastrozole, SVT status post ablation with permanent pacemaker, chronic combined heart failure, chronic kidney disease stage IIIb, history of a stroke and recurrent DVT on Eliquis, recent COVID infection and PNA. CXR 9/20: Worsening bilateral perihilar interstitial and ground-glass opacities, favor mild edema over progressive pneumonia.      SLP Plan  Discharge SLP treatment due to (comment);All goals met       Recommendations  Diet recommendations: Dysphagia 3 (mechanical soft);Thin liquid Liquids provided via: Cup;Straw Medication Administration: Small pills: whole with thin liquids; larger pills (e.g., potassium) Whole meds with puree  Supervision: Staff to assist with self feeding Compensations: Minimize environmental distractions;Slow rate;Small sips/bites Postural Changes and/or Swallow Maneuvers: Seated upright 90 degrees;Upright 30-60 min after meal                 Oral Care Recommendations: Oral care BID Follow up Recommendations: Other (comment) (TBD) SLP Visit Diagnosis: Dysphagia, unspecified (R13.10) Plan: Discharge SLP treatment due to (comment);All goals met       Kaliyan Osbourn I. Hardin Negus, [place], [place] Office number [phone] Pager [place] 07/20/2020, 2:52 PM

## 2020-07-20 NOTE — TOC Progression Note (Signed)
Transition of Care [facility]) - Progression Note    Patient Details  Name: KATI RIGGENBACH MRN: [id number] Date of Birth: 1933/09/01  Transition of Care [facility]) CM/SW [place], [place] Phone Number: 07/20/2020, 2:41 PM  Clinical Narrative:      CSW spoke with patients daughter [name] who agreed to SNF placement at [facility] for patient. CSW called [place] who confirmed that they can accept patient for SNF placement. CSW called patients insurance to provide facility choice. Insurance authorization has been approved. Insurance approval number is [id number] next review date 9/29.   Patient has SN[facility] bed at [facility]. Insurance authorization has been approved.  CSW will continue to follow.      Expected Discharge Plan and Services           Expected Discharge Date: 07/20/20                                     Social Determinants of Health (SDOH) Interventions    Readmission Risk Interventions Readmission Risk Prevention Plan 01/03/2019 01/02/2019  Transportation Screening (No Data) Complete  PCP or Specialist Appt within 3-5 Days - Complete  HRI or [place] - Complete  Social Work Consult for [place] Planning/Counseling - Complete  Palliative Care Screening - Not Applicable  Medication Review Press photographer) - Complete  Some recent data might be hidden

## 2020-07-22 DIAGNOSIS — K219 Gastro-esophageal reflux disease without esophagitis: Secondary | ICD-10-CM | POA: Diagnosis not present

## 2020-07-22 DIAGNOSIS — E119 Type 2 diabetes mellitus without complications: Secondary | ICD-10-CM | POA: Diagnosis not present

## 2020-07-22 DIAGNOSIS — I10 Essential (primary) hypertension: Secondary | ICD-10-CM | POA: Diagnosis not present

## 2020-07-22 DIAGNOSIS — I5032 Chronic diastolic (congestive) heart failure: Secondary | ICD-10-CM | POA: Diagnosis not present

## 2020-07-22 DIAGNOSIS — D649 Anemia, unspecified: Secondary | ICD-10-CM | POA: Diagnosis not present

## 2020-07-23 DIAGNOSIS — E119 Type 2 diabetes mellitus without complications: Secondary | ICD-10-CM | POA: Diagnosis not present

## 2020-07-23 DIAGNOSIS — I480 Paroxysmal atrial fibrillation: Secondary | ICD-10-CM | POA: Diagnosis not present

## 2020-07-23 DIAGNOSIS — K219 Gastro-esophageal reflux disease without esophagitis: Secondary | ICD-10-CM | POA: Diagnosis not present

## 2020-07-23 DIAGNOSIS — I5032 Chronic diastolic (congestive) heart failure: Secondary | ICD-10-CM | POA: Diagnosis not present

## 2020-07-24 ENCOUNTER — Telehealth: Payer: Self-pay

## 2020-07-24 ENCOUNTER — Telehealth: Payer: Medicare Other

## 2020-07-24 NOTE — Telephone Encounter (Signed)
  Chronic Care Management   Outreach Note  07/24/2020 Name: [name] MRN: [id number] DOB: 1933/08/26  Referred by: [name], MD Reason for referral : Care Coordination   SW placed an unsuccessful outbound call to [place] department to request an update on patient status. SW left a voice message requesting a return call.  Follow Up Plan: The care management team will reach out to the patient again over the next 7 days.   [name], BSW, CDP Social Worker, Certified Dementia Practitioner [place] [facility] [place] Management [phone]

## 2020-07-28 ENCOUNTER — Ambulatory Visit: Payer: Medicare Other

## 2020-07-28 DIAGNOSIS — E1122 Type 2 diabetes mellitus with diabetic chronic kidney disease: Secondary | ICD-10-CM

## 2020-07-28 DIAGNOSIS — I5032 Chronic diastolic (congestive) heart failure: Secondary | ICD-10-CM

## 2020-07-28 DIAGNOSIS — N184 Chronic kidney disease, stage 4 (severe): Secondary | ICD-10-CM

## 2020-07-28 NOTE — Chronic Care Management (AMB) (Signed)
Chronic Care Management    Social Work Follow Up Note  07/28/2020 Name: [name] MRN: [id number] DOB: 06-08-1933  [name] is a 86 y.o. year old female who is a primary care patient of [name], MD. The CCM team was consulted for assistance with care coordination.   Review of patient status, including review of consultants reports, other relevant assessments, and collaboration with appropriate care team members and the patient's provider was performed as part of comprehensive patient evaluation and provision of chronic care management services.    SW received inbound call from [place] with [place] who reports patient is currently in rehab staying in room 111. Successful outbound call placed to the patients daughter, [name], who reports plans for patient to remain in rehab until at least January while [name] recovers from a rotator cuff surgery. [name] is also working with case worker to determine if the patient will qualify for LTC Medicaid to remain in SNF long-term if in the patients best interest.   [name] requests an order be faxed to [facility] for a low sodium diet. SW collaboration with [name] to request diet orders.    Outpatient Encounter Medications as of 07/28/2020  Medication Sig   acetaminophen (TYLENOL) 500 MG tablet Take 1,000 mg by mouth every 6 (six) hours as needed for mild pain, moderate pain or headache.    albuterol (VENTOLIN HFA) 108 (90 Base) MCG/ACT inhaler Inhale 2 puffs into the lungs every 4 (four) hours as needed for wheezing or shortness of breath.   amiodarone (PACERONE) 200 MG tablet Take one tablet by mouth daily Monday-Saturday. Do NOT take Sunday (Patient taking differently: Take 200 mg by mouth See admin instructions. Take one tablet by mouth daily Monday-Saturday. Do NOT take Sunday)   anastrozole (ARIMIDEX) 1 MG tablet TAKE 1 TABLET(1 MG) BY MOUTH DAILY (Patient taking differently: Take 1 mg by mouth daily. )    atorvastatin (LIPITOR) 20 MG tablet Take 1 tablet (20 mg total) by mouth at bedtime.   benzonatate (TESSALON) 100 MG capsule Take 1 capsule (100 mg total) by mouth every 8 (eight) hours. Swallow capsule whole.  Do not chew   cholecalciferol (VITAMIN D3) 25 MCG (1000 UNIT) tablet Take 1,000 Units by mouth daily.   diclofenac sodium (VOLTAREN) 1 % GEL Apply 2 g topically 4 (four) times daily.   ELIQUIS 2.5 MG TABS tablet TAKE 1 TABLET(2.5 MG) BY MOUTH TWICE DAILY (Patient taking differently: Take 2.5 mg by mouth 2 (two) times daily. )   famotidine (PEPCID) 40 MG tablet Take 40 mg by mouth 2 (two) times daily.   ferrous sulfate 325 (65 FE) MG tablet Take 325 mg by mouth daily with breakfast.   HUMALOG KWIKPEN 200 UNIT/ML KwikPen INJECT 3 UNITS UNDER THE SKIN BEFORE BREAKFAST, LUNCH, DINNER IF SUGARS ARE GREATER THAN 175. (Patient taking differently: Inject 3 Units into the skin 3 (three) times daily as needed (Blood Sugar > 175). )   insulin detemir (LEVEMIR) 100 UNIT/ML injection Inject 0.15 mLs (15 Units total) into the skin at bedtime.   ipratropium (ATROVENT HFA) 17 MCG/ACT inhaler Inhale 2 puffs into the lungs every 6 (six) hours as needed for wheezing.   metoprolol succinate (TOPROL-XL) 25 MG 24 hr tablet Take 1 tablet (25 mg total) by mouth daily.   mirtazapine (REMERON) 7.5 MG tablet TAKE 1 TABLET(7.5 MG) BY MOUTH AT BEDTIME (Patient taking differently: Take 7.5 mg by mouth at bedtime. )   MYRBETRIQ 50 MG TB24  tablet Take 50 mg by mouth daily.   omeprazole (PRILOSEC) 40 MG capsule TAKE 1 CAPSULE BY MOUTH DAILY BEFORE A MEAL (Patient taking differently: Take 40 mg by mouth daily before breakfast. )   oxybutynin (DITROPAN) 5 MG tablet TAKE 1 TABLET(5 MG) BY MOUTH TWICE DAILY (Patient taking differently: Take 5 mg by mouth 2 (two) times daily. )   pregabalin (LYRICA) 75 MG capsule Take 1 capsule (75 mg total) by mouth at bedtime.   Semaglutide,0.25 or 0.5MG /DOS, (OZEMPIC, 0.25 OR  0.5 MG/DOSE,) 2 MG/1.5ML SOPN Inject 0.25 mg into the skin once a week. Take on Wednesday   torsemide (DEMADEX) 20 MG tablet Take 1 tablet (20 mg total) by mouth daily. You may take an extra 20 mg tablet daily as needed for swelling   vitamin B-12 (CYANOCOBALAMIN) 1000 MCG tablet Take 1,000 mcg by mouth daily.   No facility-administered encounter medications on file as of 07/28/2020.    Follow Up Plan: SW will follow up with patient by phone over the next two months.   [name], BSW, CDP Social Worker, Certified Dementia Practitioner [place] [facility] [place] Management [phone]

## 2020-07-29 DIAGNOSIS — I10 Essential (primary) hypertension: Secondary | ICD-10-CM | POA: Diagnosis not present

## 2020-07-29 DIAGNOSIS — K219 Gastro-esophageal reflux disease without esophagitis: Secondary | ICD-10-CM | POA: Diagnosis not present

## 2020-07-29 DIAGNOSIS — I5032 Chronic diastolic (congestive) heart failure: Secondary | ICD-10-CM | POA: Diagnosis not present

## 2020-07-29 DIAGNOSIS — D649 Anemia, unspecified: Secondary | ICD-10-CM | POA: Diagnosis not present

## 2020-07-29 DIAGNOSIS — E119 Type 2 diabetes mellitus without complications: Secondary | ICD-10-CM | POA: Diagnosis not present

## 2020-07-29 NOTE — Progress Notes (Signed)
Cardiology Office Note    Date:  08/04/2020   ID:  [name], DOB 09-Aug-1933, MRN [id number]  PCP:  [name], MD  Cardiologist: Larae Grooms, MD EPS: None  Chief Complaint  Patient presents with  . Hospitalization Follow-up    History of Present Illness:  [name] is a 86 y.o. female with past medical history of hypertension, SVT on amiodarone, chronic stage III kidney disease, chronic systolic congestive heart failure,   Patient had a recent Covid infection admitted 8/2021with hypoglycemia and aspiration pneumonia.Cardiology saw for evaluation of elevated troponin felt secondary to demand ischemia. Echocardiogram August 31 showed ejection fraction 29%, grade 1 diastolic dysfunction, severe left atrial enlargement.  Plan was to consider outpatient Myoview but patient was made DNR and palliative care so reassess as an outpatient.  Patient comes in by herself from a rehab facility. She's in a wheelchair and they are trying to get her to walk but she's struggling with it. Complains of no taste or appetite. Says she gets a little short of breath doing her exercises but denies chest pain, palpitations, edema, dizziness or presyncope.  Blood pressures reviewed from the facility and are overall stable.     Past Medical History:  Diagnosis Date  . Abnormal echocardiogram    a. possible mass on echo 05/2019 on PPM, b. no evidence of mass / atrial lead vegetation on follow up echo 06/2019  . Anemia   . Arthritis   . Breast cancer ([place]) 10/2018   Invasive ductal carcinoma  . Cancer ([place])   . Cataract   . Chronic combined systolic and diastolic CHF (congestive heart failure) ([place])    a. Previously diastolic but patient AVS from outside hospital listed systolic CHF and cardiomyopathy, records pending.  . CKD (chronic kidney disease), stage III ([place])   . Clotting disorder ([place])   . CVA (cerebral vascular accident) ([place])    residual right sided weakness and mild dysphagia    . Diabetes mellitus ([place])   . Dizziness and giddiness   . DVT (deep venous thrombosis) ([facility])    a. on anticoagulation for this.  . Essential hypertension   . LBBB (left bundle branch block)   . Mitral regurgitation    a. Mod MR by echo 2014.  . Muscular deconditioning   . Obesity   . Pacemaker   . Pericardial effusion   . SVT (supraventricular tachycardia) ([place])    a. In 2013 she had an EPS with ablation for SVT which did not eliminate the SVT completely. She also had bradycardia which limited medication. She was placed on amiodarone by Dr. Lovena Le.  . Thrombocytopenia ([place]) 11/21/2011    Past Surgical History:  Procedure Laterality Date  . EYE SURGERY    . SUPRAVENTRICULAR TACHYCARDIA ABLATION N/A 10/11/2012   Procedure: SUPRAVENTRICULAR TACHYCARDIA ABLATION;  Surgeon: Evans Lance, MD;  Location: [facility] C[facility] LAB;  Service: Cardiovascular;  Laterality: N/A;    Current Medications: Current Meds  Medication Sig  . acetaminophen (TYLENOL) 500 MG tablet Take 1,000 mg by mouth every 6 (six) hours as needed for mild pain, moderate pain or headache.   . albuterol (VENTOLIN HFA) 108 (90 Base) MCG/ACT inhaler Inhale 2 puffs into the lungs every 4 (four) hours as needed for wheezing or shortness of breath.  [name] amiodarone (PACERONE) 200 MG tablet Take one tablet by mouth daily Monday-Saturday. Do NOT take Sunday  . anastrozole (ARIMIDEX) 1 MG tablet TAKE 1 TABLET(1 MG) BY MOUTH DAILY  .  atorvastatin (LIPITOR) 20 MG tablet Take 1 tablet (20 mg total) by mouth at bedtime.  . benzonatate (TESSALON) 100 MG capsule Take 1 capsule (100 mg total) by mouth every 8 (eight) hours. Swallow capsule whole.  Do not chew  . cholecalciferol (VITAMIN D3) 25 MCG (1000 UNIT) tablet Take 1,000 Units by mouth daily.  . diclofenac sodium (VOLTAREN) 1 % GEL Apply 2 g topically 4 (four) times daily.  [name] ELIQUIS 2.5 MG TABS tablet TAKE 1 TABLET(2.5 MG) BY MOUTH TWICE DAILY  . famotidine (PEPCID) 40 MG tablet Take 40 mg  by mouth 2 (two) times daily.  . ferrous sulfate 325 (65 FE) MG tablet Take 325 mg by mouth daily with breakfast.  . HUMALOG KWIKPEN 200 UNIT/ML KwikPen INJECT 3 UNITS UNDER THE SKIN BEFORE BREAKFAST, LUNCH, DINNER IF SUGARS ARE GREATER THAN 175.  . insulin detemir (LEVEMIR) 100 UNIT/ML injection Inject 0.15 mLs (15 Units total) into the skin at bedtime.  [name] ipratropium (ATROVENT HFA) 17 MCG/ACT inhaler Inhale 2 puffs into the lungs every 6 (six) hours as needed for wheezing.  . metoprolol succinate (TOPROL-XL) 25 MG 24 hr tablet Take 1 tablet (25 mg total) by mouth daily.  . mirtazapine (REMERON) 7.5 MG tablet TAKE 1 TABLET(7.5 MG) BY MOUTH AT BEDTIME  . MYRBETRIQ 50 MG TB24 tablet Take 50 mg by mouth daily.  [name] omeprazole (PRILOSEC) 20 MG capsule Take 20 mg by mouth daily.  [name] oxybutynin (DITROPAN) 5 MG tablet TAKE 1 TABLET(5 MG) BY MOUTH TWICE DAILY  . pregabalin (LYRICA) 75 MG capsule Take 1 capsule (75 mg total) by mouth at bedtime.  . Semaglutide,0.25 or 0.5MG/DOS, (OZEMPIC, 0.25 OR 0.5 MG/DOSE,) 2 MG/1.5ML SOPN Inject 0.25 mg into the skin once a week. Take on Wednesday  . torsemide (DEMADEX) 20 MG tablet Take 1 tablet (20 mg total) by mouth daily. You may take an extra 20 mg tablet daily as needed for swelling  . vitamin B-12 (CYANOCOBALAMIN) 1000 MCG tablet Take 1,000 mcg by mouth daily.     Allergies:   Aspirin and Penicillins   Social History   Socioeconomic History  . Marital status: Widowed    Spouse name: Not on file  . Number of children: Not on file  . Years of education: Not on file  . Highest education level: Not on file  Occupational History  . Occupation: retired  Tobacco Use  . Smoking status: Never Smoker  . Smokeless tobacco: Never Used  Vaping Use  . Vaping Use: Never used  Substance and Sexual Activity  . Alcohol use: No  . Drug use: No  . Sexual activity: Not Currently  Other Topics Concern  . Not on file  Social History Narrative   Lives at home with  grandchild, daughter comes and help    Social Determinants of Health   Financial Resource Strain: Medium Risk  . Difficulty of Paying Living Expenses: Somewhat hard  Food Insecurity: No Food Insecurity  . Worried About Charity fundraiser in the Last Year: Never true  . Ran Out of Food in the Last Year: Never true  Transportation Needs: No Transportation Needs  . Lack of Transportation (Medical): No  . Lack of Transportation (Non-Medical): No  Physical Activity: Inactive  . Days of Exercise per Week: 0 days  . Minutes of Exercise per Session: 0 min  Stress: No Stress Concern Present  . Feeling of Stress : Only a little  Social Connections:   . Frequency of Communication with  Friends and Family: Not on file  . Frequency of Social Gatherings with Friends and Family: Not on file  . Attends Religious Services: Not on file  . Active Member of Clubs or Organizations: Not on file  . Attends Archivist Meetings: Not on file  . Marital Status: Not on file     Family History:  The patient's family history includes Breast cancer in her maternal aunt; Cancer in her father; Heart attack in her brother; Hypertension in her brother, daughter, and sister; Stroke in her mother and sister.   ROS:   Please see the history of present illness.    ROS All other systems reviewed and are negative.   PHYSICAL EXAM:   VS:  BP 134/74   Pulse 72   Ht '5\' 5"'  (1.651 m)   Wt 182 lb (82.6 kg)   SpO2 97%   BMI 30.29 kg/m   Physical Exam  GEN: Well nourished, well developed, in no acute distress  Neck: no JVD, carotid bruits, or masses Cardiac:RRR; no murmurs, rubs, or gallops  Respiratory:  clear to auscultation bilaterally, normal work of breathing GI: soft, nontender, nondistended, + BS Ext: Mild ankle edema bilaterally  neuro:  Alert and Oriented x 3 Psych: euthymic mood, full affect  Wt Readings from Last 3 Encounters:  08/04/20 182 lb (82.6 kg)  07/20/20 193 lb 5.5 oz (87.7 kg)    06/30/20 192 lb 0.3 oz (87.1 kg)      Studies/Labs Reviewed:   EKG:  EKG is not ordered today.    Recent Labs: 07/14/2020: TSH 1.099 07/17/2020: B Natriuretic Peptide 856.6 07/19/2020: ALT 23 07/20/2020: BUN 13; Creatinine, Ser 1.39; Hemoglobin 8.4; Magnesium 1.8; Platelets 218; Potassium 3.7; Sodium 141   Lipid Panel    Component Value Date/Time   CHOL 195 08/15/2018 1236   TRIG 56 01/02/2019 1425   HDL 81 08/15/2018 1236   CHOLHDL 2.4 08/15/2018 1236   CHOLHDL 2.6 12/11/2008 0130   VLDL 13 12/11/2008 0130   LDLCALC 96 08/15/2018 1236    Additional studies/ records that were reviewed today include:  Echo 06/23/2020 IMPRESSIONS     1. Left ventricular ejection fraction, by estimation, is 30%. The left  ventricle has moderate to severely decreased function. The left ventricle  demonstrates global hypokinesis with septal-lateral dyssynchrony. There is  severe left ventricular  hypertrophy. Left ventricular diastolic parameters are consistent with  Grade I diastolic dysfunction (impaired relaxation).   2. Right ventricular systolic function is mildly reduced. The right  ventricular size is normal. Tricuspid regurgitation signal is inadequate  for assessing PA pressure.   3. Left atrial size was severely dilated.   4. The mitral valve is normal in structure. Trivial mitral valve  regurgitation. No evidence of mitral stenosis.   5. The aortic valve is tricuspid. Aortic valve regurgitation is not  visualized. Mild aortic valve sclerosis is present, with no evidence of  aortic valve stenosis.   6. Aortic dilatation noted. There is mild dilatation of the aortic root  measuring 39 mm.   7. The inferior vena cava is dilated in size with <50% respiratory  variability, suggesting right atrial pressure of 15 mmHg.   FINDINGS   Left Ventricle: Left ventricular ejection fraction, by estimation, is  30%. The left ventricle has moderate to severely decreased function. The  left  ventricle demonstrates global hypokinesis. The left ventricular  internal cavity size was normal in size.  There is severe left ventricular hypertrophy. Left ventricular diastolic  parameters are consistent with Grade I diastolic dysfunction (impaired  relaxation).   Right Ventricle: The right ventricular size is normal. No increase in  right ventricular wall thickness. Right ventricular systolic function is  mildly reduced. Tricuspid regurgitation signal is inadequate for assessing  PA pressure.   Left Atrium: Left atrial size was severely dilated.   Right Atrium: Right atrial size was normal in size.   Pericardium: Trivial pericardial effusion is present.   Mitral Valve: The mitral valve is normal in structure. Trivial mitral  valve regurgitation. No evidence of mitral valve stenosis.   Tricuspid Valve: The tricuspid valve is normal in structure. Tricuspid  valve regurgitation is trivial.   Aortic Valve: The aortic valve is tricuspid. Aortic valve regurgitation is  not visualized. Mild aortic valve sclerosis is present, with no evidence  of aortic valve stenosis.   Pulmonic Valve: The pulmonic valve was normal in structure. Pulmonic valve  regurgitation is not visualized.   Aorta: Aortic dilatation noted. There is mild dilatation of the aortic  root measuring 39 mm.   Venous: The inferior vena cava is dilated in size with less than 50%  respiratory variability, suggesting right atrial pressure of 15 mmHg.   IAS/Shunts: No atrial level shunt detected by color flow Doppler.         ASSESSMENT:    1. Elevated troponin   2. Cardiomyopathy, unspecified type ([place])   3. Aspiration pneumonia, unspecified aspiration pneumonia type, unspecified laterality, unspecified part of lung ([place])   4. SVT (supraventricular tachycardia) ([place])   5. Pacemaker   6. History of DVT (deep vein thrombosis)      PLAN:  In order of problems listed above:  Elevated troponins likely demand  ischemia in the setting of aspiration pneumonia.  Plan was to consider NST when she recovers from pneumonia but is DNR and palliative care.  Patient currently in a rehab facility and still unable to walk and mostly in a wheelchair.  She denies complaints of chest pain.  She would like to hold off on any testing which I agree with.  Cardiomyopathy with new LV dysfunction in the setting of recent Covid infection.  Overall she is well compensated.  Her weight is down 11 pounds since hospitalization.  She has mild ankle edema which I believe is chronic.  Lungs are clear.  Continue current medications.  On low-dose Lasix 20 mg once daily.  Check be met and CBC.  SVT on amiodarone heart rate regular on exam.  Status post permanent pacemaker followed by Dr. Lovena Le  History of DVT on chronic anticoagulation with eliquis low dose 2.5 mg bid    Medication Adjustments/Labs and Tests Ordered: Current medicines are reviewed at length with the patient today.  Concerns regarding medicines are outlined above.  Medication changes, Labs and Tests ordered today are listed in the Patient Instructions below. Patient Instructions  Medication Instructions:  Your physician recommends that you continue on your current medications as directed. Please refer to the Current Medication list given to you today.  *If you need a refill on your cardiac medications before your next appointment, please call your pharmacy*   Lab Work: TODAY: BMET, CBC  If you have labs (blood work) drawn today and your tests are completely normal, you will receive your results only by: [name] MyChart Message (if you have MyChart) OR . A paper copy in the mail If you have any lab test that is abnormal or we need to change your treatment, we will call  you to review the results.   Testing/Procedures: None   Follow-Up: At [facility], you and your health needs are our priority.  As part of our continuing mission to provide you with  exceptional heart care, we have created designated Provider Care Teams.  These Care Teams include your primary Cardiologist (physician) and Advanced Practice Providers (APPs -  Physician Assistants and Nurse Practitioners) who all work together to provide you with the care you need, when you need it.  We recommend signing up for the patient portal called "MyChart".  Sign up information is provided on this After Visit Summary.  MyChart is used to connect with patients for Virtual Visits (Telemedicine).  Patients are able to view lab/test results, encounter notes, upcoming appointments, etc.  Non-urgent messages can be sent to your provider as well.   To learn more about what you can do with MyChart, go to [email or web address].    Your next appointment:   11/11/2020  The format for your next appointment:   In Person  Provider:   Casandra Doffing, MD       Signed, Ermalinda Barrios, PA-C  08/04/2020 9:49 AM    [address] [place], [place], [place]  [address] Phone: [phone]; Fax: [phone]

## 2020-08-01 NOTE — Patient Instructions (Addendum)
Visit Information  Goals Addressed            This Visit's Progress   . Pharmacy Care Plan       CARE PLAN ENTRY (see longitudinal plan of care for additional care plan information)  Current Barriers:  . Chronic Disease Management support, education, and care coordination needs related to Hypertension and Diabetes   Hypertension BP Readings from Last 3 Encounters:  07/20/20 (!) 147/73  07/01/20 (!) 116/59  06/21/20 (!) 156/64   . Pharmacist Clinical Goal(s): o Over the next 90 days, patient will work with PharmD and providers to achieve BP goal <130/80 . Current regimen:  o Carvedilol 12.77m twice daily o Torsemide 20 twice daily, may take an extra tablet daily if needed for swelling o Bidil 20-37.536mthree times daily . Interventions: o Provided dietary and exercise recommendations o Recommend chair exercises o Assist patient with obtaining home blood pressure monitor if needed . Patient self care activities - Over the next 90 days, patient will: o Check BP once blood pressure monitored obtained, document, and provide at future appointments o Ensure daily salt intake < 2300 mg/day o Increase exercise as able with a goal of 30 minutes 5 times weekly - Chair exercises  Diabetes Lab Results  Component Value Date/Time   HGBA1C 9.0 (H) 07/14/2020 09:00 AM   HGBA1C 9.1 (H) 06/22/2020 04:21 PM   . Pharmacist Clinical Goal(s): o Over the next 90 days, patient will work with PharmD and providers to achieve A1c goal <7% . Current regimen:  o Humalog kwikpen 200 unit/ml 3 units before meals if blood sugar greater than 175 per sliding scale  o Levemir 16 units in the am and 10 units in the pm . Interventions: o Provided dietary and exercise recommendations o Will assist with patient assistance application for insulin . Patient self care activities - Over the next 90 days, patient will: o Check blood sugar twice daily, document, and provide at future appointments o Contact  provider with any episodes of hypoglycemia o Increase exercise as able with a goal of 30 minutes 5 times weekly - Chair exercises  Medication management . Pharmacist Clinical Goal(s): o Over the next 90 days, patient will work with PharmD and providers to achieve optimal medication adherence . Current pharmacy: [facility] . Interventions o Comprehensive medication review performed. o Continue current medication management strategy . Patient self care activities - Over the next 90 days, patient will: o Focus on medication adherence by utilizing a pill box o Take medications as prescribed o Report any questions or concerns to PharmD and/or provider(s)  Initial goal documentation        Ms. IvOzbunas given information about Chronic Care Management services today including:  1. CCM service includes personalized support from designated clinical staff supervised by her physician, including individualized plan of care and coordination with other care providers 2. 24/7 contact phone numbers for assistance for urgent and routine care needs. 3. Standard insurance, coinsurance, copays and deductibles apply for chronic care management only during months in which we provide at least 20 minutes of these services. Most insurances cover these services at 100%, however patients may be responsible for any copay, coinsurance and/or deductible if applicable. This service may help you avoid the need for more expensive face-to-face services. 4. Only one practitioner may furnish and bill the service in a calendar month. 5. The patient may stop CCM services at any time (effective at the end of the month) by phone call  to the office staff.  Patient agreed to services and verbal consent obtained.   Patient verbalizes understanding of instructions provided today.  The pharmacy team will reach out to the patient again over the next 30 days.   Jannette Fogo, PharmD Clinical Pharmacist [facility] [phone]   Exercises To Do While Sitting  Exercises that you do while sitting (chair exercises) can give you many of the same benefits as full exercise. Benefits include strengthening your heart, burning calories, and keeping muscles and joints healthy. Exercise can also improve your mood and help with depression and anxiety. You may benefit from chair exercises if you are unable to do standing exercises because of:  Diabetic foot pain.  Obesity.  Illness.  Arthritis.  Recovery from surgery or injury.  Breathing problems.  Balance problems.  Another type of disability. Before starting chair exercises, check with your health care provider or a physical therapist to find out how much exercise you can tolerate and which exercises are safe for you. If your health care provider approves:  Start out slowly and build up over time. Aim to work up to about 10-20 minutes for each exercise session.  Make exercise part of your daily routine.  Drink water when you exercise. Do not wait until you are thirsty. Drink every 10-15 minutes.  Stop exercising right away if you have pain, nausea, shortness of breath, or dizziness.  If you are exercising in a wheelchair, make sure to lock the wheels.  Ask your health care provider whether you can do tai chi or yoga. Many positions in these mind-body exercises can be modified to do while seated. Warm-up Before starting other exercises: 1. Sit up as straight as you can. Have your knees bent at 90 degrees, which is the shape of the capital letter "L." Keep your feet flat on the floor. 2. Sit at the front edge of your chair, if you can. 3. Pull in (tighten) the muscles in your abdomen and stretch your spine and neck as straight as you can. Hold this position for a few minutes. 4. Breathe in and out evenly. Try to concentrate on your breathing, and relax your mind. Stretching Exercise A: Arm stretch 1. Hold your arms out  straight in front of your body. 2. Bend your hands at the wrist with your fingers pointing up, as if signaling someone to stop. Notice the slight tension in your forearms as you hold the position. 3. Keeping your arms out and your hands bent, rotate your hands outward as far as you can and hold this stretch. Aim to have your thumbs pointing up and your pinkie fingers pointing down. Slowly repeat arm stretches for one minute as tolerated. Exercise B: Leg stretch 1. If you can move your legs, try to "draw" letters on the floor with the toes of your foot. Write your name with one foot. 2. Write your name with the toes of your other foot. Slowly repeat the movements for one minute as tolerated. Exercise C: Reach for the sky 1. Reach your hands as far over your head as you can to stretch your spine. 2. Move your hands and arms as if you are climbing a rope. Slowly repeat the movements for one minute as tolerated. Range of motion exercises Exercise A: Shoulder roll 1. Let your arms hang loosely at your sides. 2. Lift just your shoulders up toward your ears, then let them relax back down. 3. When your shoulders feel loose, rotate your  shoulders in backward and forward circles. Do shoulder rolls slowly for one minute as tolerated. Exercise B: March in place 1. As if you are marching, pump your arms and lift your legs up and down. Lift your knees as high as you can. ? If you are unable to lift your knees, just pump your arms and move your ankles and feet up and down. March in place for one minute as tolerated. Exercise C: Seated jumping jacks 1. Let your arms hang down straight. 2. Keeping your arms straight, lift them up over your head. Aim to point your fingers to the ceiling. 3. While you lift your arms, straighten your legs and slide your heels along the floor to your sides, as wide as you can. 4. As you bring your arms back down to your sides, slide your legs back together. ? If you are unable  to use your legs, just move your arms. Slowly repeat seated jumping jacks for one minute as tolerated. Strengthening exercises Exercise A: Shoulder squeeze 1. Hold your arms straight out from your body to your sides, with your elbows bent and your fists pointed at the ceiling. 2. Keeping your arms in the bent position, move them forward so your elbows and forearms meet in front of your face. 3. Open your arms back out as wide as you can with your elbows still bent, until you feel your shoulder blades squeezing together. Hold for 5 seconds. Slowly repeat the movements forward and backward for one minute as tolerated. Contact a health care provider if you:  Had to stop exercising due to any of the following: ? Pain. ? Nausea. ? Shortness of breath. ? Dizziness. ? Fatigue.  Have significant pain or soreness after exercising. Get help right away if you have:  Chest pain.  Difficulty breathing. These symptoms may represent a serious problem that is an emergency. Do not wait to see if the symptoms will go away. Get medical help right away. Call your local emergency services (911 in the [place] Do not drive yourself to the hospital. This information is not intended to replace advice given to you by your health care provider. Make sure you discuss any questions you have with your health care provider. Document Revised: 01/31/2019 Document Reviewed: 08/23/2017 Elsevier Patient Education  2020 [employer].

## 2020-08-03 ENCOUNTER — Telehealth: Payer: Self-pay

## 2020-08-03 NOTE — Telephone Encounter (Signed)
Biotronik alert received for 1 episode of AF w/ controlled VR, duration 41 minutes. Last in-clinic check on 05/06/20, AT/AF burden 0%. Currently taking Eliquis per EMR.  Called patient to assess. No answer, LMOVM.

## 2020-08-04 ENCOUNTER — Ambulatory Visit (INDEPENDENT_AMBULATORY_CARE_PROVIDER_SITE_OTHER): Payer: Medicare Other | Admitting: Physician Assistant

## 2020-08-04 ENCOUNTER — Other Ambulatory Visit: Payer: Self-pay

## 2020-08-04 ENCOUNTER — Encounter: Payer: Self-pay | Admitting: Physician Assistant

## 2020-08-04 VITALS — BP 134/74 | HR 72 | Ht 65.0 in | Wt 182.0 lb

## 2020-08-04 DIAGNOSIS — R778 Other specified abnormalities of plasma proteins: Secondary | ICD-10-CM

## 2020-08-04 DIAGNOSIS — J690 Pneumonitis due to inhalation of food and vomit: Secondary | ICD-10-CM | POA: Diagnosis not present

## 2020-08-04 DIAGNOSIS — I471 Supraventricular tachycardia: Secondary | ICD-10-CM

## 2020-08-04 DIAGNOSIS — Z950 Presence of cardiac pacemaker: Secondary | ICD-10-CM | POA: Diagnosis not present

## 2020-08-04 DIAGNOSIS — I429 Cardiomyopathy, unspecified: Secondary | ICD-10-CM

## 2020-08-04 DIAGNOSIS — Z86718 Personal history of other venous thrombosis and embolism: Secondary | ICD-10-CM

## 2020-08-04 LAB — CBC
Hematocrit: 31.2 % — ABNORMAL LOW (ref 34.0–46.6)
Hemoglobin: 10.2 g/dL — ABNORMAL LOW (ref 11.1–15.9)
MCH: 30.7 pg (ref 26.6–33.0)
MCHC: 32.7 g/dL (ref 31.5–35.7)
MCV: 94 fL (ref 79–97)
Platelets: 151 10*3/uL (ref 150–450)
RBC: 3.32 x10E6/uL — ABNORMAL LOW (ref 3.77–5.28)
RDW: 13.8 % (ref 11.7–15.4)
WBC: 3.3 10*3/uL — ABNORMAL LOW (ref 3.4–10.8)

## 2020-08-04 LAB — BASIC METABOLIC PANEL
BUN/Creatinine Ratio: 10 — ABNORMAL LOW (ref 12–28)
BUN: 14 mg/dL (ref 8–27)
CO2: 26 mmol/L (ref 20–29)
Calcium: 8.9 mg/dL (ref 8.7–10.3)
Chloride: 102 mmol/L (ref 96–106)
Creatinine, Ser: 1.41 mg/dL — ABNORMAL HIGH (ref 0.57–1.00)
GFR calc Af Amer: 39 mL/min/{1.73_m2} — ABNORMAL LOW (ref >59)
GFR calc non Af Amer: 34 mL/min/{1.73_m2} — ABNORMAL LOW (ref >59)
Glucose: 246 mg/dL — ABNORMAL HIGH (ref 65–99)
Potassium: 3.7 mmol/L (ref 3.5–5.2)
Sodium: 142 mmol/L (ref 134–144)

## 2020-08-04 NOTE — Patient Instructions (Signed)
Medication Instructions:  Your physician recommends that you continue on your current medications as directed. Please refer to the Current Medication list given to you today.  *If you need a refill on your cardiac medications before your next appointment, please call your pharmacy*   Lab Work: TODAY: BMET, CBC  If you have labs (blood work) drawn today and your tests are completely normal, you will receive your results only by:  [place] (if you have MyChart) OR  A paper copy in the mail If you have any lab test that is abnormal or we need to change your treatment, we will call you to review the results.   Testing/Procedures: None   Follow-Up: At [facility], you and your health needs are our priority.  As part of our continuing mission to provide you with exceptional heart care, we have created designated Provider Care Teams.  These Care Teams include your primary Cardiologist (physician) and Advanced Practice Providers (APPs -  Physician Assistants and Nurse Practitioners) who all work together to provide you with the care you need, when you need it.  We recommend signing up for the patient portal called "MyChart".  Sign up information is provided on this After Visit Summary.  MyChart is used to connect with patients for Virtual Visits (Telemedicine).  Patients are able to view lab/test results, encounter notes, upcoming appointments, etc.  Non-urgent messages can be sent to your provider as well.   To learn more about what you can do with MyChart, go to [email or web address].    Your next appointment:   11/11/2020  The format for your next appointment:   In Person  Provider:   Casandra Doffing, MD

## 2020-08-05 DIAGNOSIS — E119 Type 2 diabetes mellitus without complications: Secondary | ICD-10-CM | POA: Diagnosis not present

## 2020-08-05 DIAGNOSIS — I5032 Chronic diastolic (congestive) heart failure: Secondary | ICD-10-CM | POA: Diagnosis not present

## 2020-08-05 DIAGNOSIS — D649 Anemia, unspecified: Secondary | ICD-10-CM | POA: Diagnosis not present

## 2020-08-05 DIAGNOSIS — K219 Gastro-esophageal reflux disease without esophagitis: Secondary | ICD-10-CM | POA: Diagnosis not present

## 2020-08-05 DIAGNOSIS — I10 Essential (primary) hypertension: Secondary | ICD-10-CM | POA: Diagnosis not present

## 2020-08-06 ENCOUNTER — Ambulatory Visit (INDEPENDENT_AMBULATORY_CARE_PROVIDER_SITE_OTHER): Payer: Medicare Other

## 2020-08-06 DIAGNOSIS — R55 Syncope and collapse: Secondary | ICD-10-CM

## 2020-08-06 LAB — CUP PACEART REMOTE DEVICE CHECK
Battery Remaining Percentage: 90 %
Brady Statistic RA Percent Paced: 10 %
Brady Statistic RV Percent Paced: 100 %
Date Time Interrogation Session: 20211014053033
Implantable Lead Implant Date: 20200624
Implantable Lead Implant Date: 20200624
Implantable Lead Location: 753859
Implantable Lead Location: 753860
Implantable Lead Model: 377
Implantable Lead Model: 377
Implantable Lead Serial Number: 81195510
Implantable Pulse Generator Implant Date: 20200624
Lead Channel Impedance Value: 410 Ohm
Lead Channel Impedance Value: 644 Ohm
Lead Channel Pacing Threshold Amplitude: 1.3 V
Lead Channel Pacing Threshold Pulse Width: 0.4 ms
Lead Channel Sensing Intrinsic Amplitude: 1.7 mV
Lead Channel Sensing Intrinsic Amplitude: 18.7 mV
Lead Channel Setting Pacing Amplitude: 2.4 V
Lead Channel Setting Pacing Amplitude: 2.4 V
Lead Channel Setting Pacing Pulse Width: 0.4 ms
Pulse Gen Model: 407145
Pulse Gen Serial Number: 69638782

## 2020-08-06 NOTE — Telephone Encounter (Signed)
Spoke with patient, she was not aware of her heart being out of rhythm.  She does have history of AF and is on Amiodarone and Eliquis. Pt reports compliance with medications.    She reports she is feeling well expect for Diarrhea that she has today.

## 2020-08-11 NOTE — Progress Notes (Signed)
Remote pacemaker transmission.   

## 2020-08-12 ENCOUNTER — Telehealth: Payer: Self-pay

## 2020-08-12 DIAGNOSIS — E119 Type 2 diabetes mellitus without complications: Secondary | ICD-10-CM | POA: Diagnosis not present

## 2020-08-12 DIAGNOSIS — K219 Gastro-esophageal reflux disease without esophagitis: Secondary | ICD-10-CM | POA: Diagnosis not present

## 2020-08-12 DIAGNOSIS — I10 Essential (primary) hypertension: Secondary | ICD-10-CM | POA: Diagnosis not present

## 2020-08-12 DIAGNOSIS — I5032 Chronic diastolic (congestive) heart failure: Secondary | ICD-10-CM | POA: Diagnosis not present

## 2020-08-12 DIAGNOSIS — D649 Anemia, unspecified: Secondary | ICD-10-CM | POA: Diagnosis not present

## 2020-08-12 NOTE — Telephone Encounter (Signed)
Patient's daughter , [name], contacted office t let us know she is now an inpatient at [facility] facility in [place]. Monitor at bedside at facility and request made that facility be contacted with changes in treatment plan.

## 2020-08-12 NOTE — Telephone Encounter (Signed)
The pt daughter is requesting that we make the doctor at the nursing home aware of episodes. I let her speak with Cindy, rn.

## 2020-08-12 NOTE — Telephone Encounter (Signed)
Pt daughter called pt is in [place] and will be seeing a doctor at the facility while she is there upcoming appt cancelled

## 2020-08-16 ENCOUNTER — Other Ambulatory Visit: Payer: Self-pay | Admitting: Internal Medicine

## 2020-08-19 ENCOUNTER — Ambulatory Visit: Payer: Medicare Other | Admitting: Internal Medicine

## 2020-08-19 DIAGNOSIS — I10 Essential (primary) hypertension: Secondary | ICD-10-CM | POA: Diagnosis not present

## 2020-08-19 DIAGNOSIS — I5032 Chronic diastolic (congestive) heart failure: Secondary | ICD-10-CM | POA: Diagnosis not present

## 2020-08-19 DIAGNOSIS — K219 Gastro-esophageal reflux disease without esophagitis: Secondary | ICD-10-CM | POA: Diagnosis not present

## 2020-08-19 DIAGNOSIS — E119 Type 2 diabetes mellitus without complications: Secondary | ICD-10-CM | POA: Diagnosis not present

## 2020-08-20 DIAGNOSIS — E119 Type 2 diabetes mellitus without complications: Secondary | ICD-10-CM | POA: Diagnosis not present

## 2020-08-20 DIAGNOSIS — M160 Bilateral primary osteoarthritis of hip: Secondary | ICD-10-CM | POA: Diagnosis not present

## 2020-08-20 DIAGNOSIS — I5032 Chronic diastolic (congestive) heart failure: Secondary | ICD-10-CM | POA: Diagnosis not present

## 2020-08-20 DIAGNOSIS — D649 Anemia, unspecified: Secondary | ICD-10-CM | POA: Diagnosis not present

## 2020-09-06 ENCOUNTER — Emergency Department (HOSPITAL_COMMUNITY): Payer: Medicare Other

## 2020-09-06 ENCOUNTER — Emergency Department (HOSPITAL_COMMUNITY)
Admission: EM | Admit: 2020-09-06 | Discharge: 2020-09-06 | Disposition: A | Discharge status: Home / Self Care | Payer: Medicare Other | Attending: Emergency Medicine | Admitting: Emergency Medicine

## 2020-09-06 ENCOUNTER — Encounter (HOSPITAL_COMMUNITY): Payer: Self-pay

## 2020-09-06 DIAGNOSIS — Z7901 Long term (current) use of anticoagulants: Secondary | ICD-10-CM | POA: Diagnosis not present

## 2020-09-06 DIAGNOSIS — Z043 Encounter for examination and observation following other accident: Secondary | ICD-10-CM | POA: Diagnosis not present

## 2020-09-06 DIAGNOSIS — R296 Repeated falls: Secondary | ICD-10-CM | POA: Diagnosis not present

## 2020-09-06 DIAGNOSIS — W19XXXA Unspecified fall, initial encounter: Secondary | ICD-10-CM

## 2020-09-06 DIAGNOSIS — S0990XA Unspecified injury of head, initial encounter: Secondary | ICD-10-CM | POA: Insufficient documentation

## 2020-09-06 DIAGNOSIS — M2550 Pain in unspecified joint: Secondary | ICD-10-CM | POA: Diagnosis not present

## 2020-09-06 DIAGNOSIS — Z794 Long term (current) use of insulin: Secondary | ICD-10-CM | POA: Diagnosis not present

## 2020-09-06 DIAGNOSIS — M79603 Pain in arm, unspecified: Secondary | ICD-10-CM | POA: Diagnosis not present

## 2020-09-06 DIAGNOSIS — I130 Hypertensive heart and chronic kidney disease with heart failure and stage 1 through stage 4 chronic kidney disease, or unspecified chronic kidney disease: Secondary | ICD-10-CM | POA: Diagnosis not present

## 2020-09-06 DIAGNOSIS — R531 Weakness: Secondary | ICD-10-CM | POA: Diagnosis not present

## 2020-09-06 DIAGNOSIS — I5042 Chronic combined systolic (congestive) and diastolic (congestive) heart failure: Secondary | ICD-10-CM | POA: Insufficient documentation

## 2020-09-06 DIAGNOSIS — Z7401 Bed confinement status: Secondary | ICD-10-CM | POA: Diagnosis not present

## 2020-09-06 DIAGNOSIS — Z79899 Other long term (current) drug therapy: Secondary | ICD-10-CM | POA: Insufficient documentation

## 2020-09-06 DIAGNOSIS — M25552 Pain in left hip: Secondary | ICD-10-CM | POA: Insufficient documentation

## 2020-09-06 DIAGNOSIS — Z8616 Personal history of COVID-19: Secondary | ICD-10-CM | POA: Insufficient documentation

## 2020-09-06 DIAGNOSIS — E1122 Type 2 diabetes mellitus with diabetic chronic kidney disease: Secondary | ICD-10-CM | POA: Diagnosis not present

## 2020-09-06 DIAGNOSIS — Z743 Need for continuous supervision: Secondary | ICD-10-CM | POA: Diagnosis not present

## 2020-09-06 DIAGNOSIS — Z853 Personal history of malignant neoplasm of breast: Secondary | ICD-10-CM | POA: Insufficient documentation

## 2020-09-06 DIAGNOSIS — R6889 Other general symptoms and signs: Secondary | ICD-10-CM | POA: Diagnosis not present

## 2020-09-06 DIAGNOSIS — Z950 Presence of cardiac pacemaker: Secondary | ICD-10-CM | POA: Diagnosis not present

## 2020-09-06 DIAGNOSIS — N183 Chronic kidney disease, stage 3 unspecified: Secondary | ICD-10-CM | POA: Diagnosis not present

## 2020-09-06 DIAGNOSIS — S199XXA Unspecified injury of neck, initial encounter: Secondary | ICD-10-CM | POA: Diagnosis not present

## 2020-09-06 DIAGNOSIS — I10 Essential (primary) hypertension: Secondary | ICD-10-CM | POA: Diagnosis not present

## 2020-09-06 DIAGNOSIS — W050XXA Fall from non-moving wheelchair, initial encounter: Secondary | ICD-10-CM | POA: Insufficient documentation

## 2020-09-06 DIAGNOSIS — R519 Headache, unspecified: Secondary | ICD-10-CM | POA: Diagnosis not present

## 2020-09-06 NOTE — ED Notes (Signed)
[facility] called for transport back to [place].  

## 2020-09-06 NOTE — ED Notes (Signed)
Please call pt's daughter at [phone]

## 2020-09-06 NOTE — TOC Initial Note (Signed)
Transition of Care [facility]) - Initial/Assessment Note    Patient Details  Name: [name] MRN: [id number] Date of Birth: 1933-04-27  Transition of Care [facility]) CM/SW Contact:    Verdell Carmine, RN Phone Number: 09/06/2020, 2:19 PM  Clinical Narrative:                  patient from [facility], she is due to complete Rehabilitation by next week.  the patient was speaking about her daughter getting her a new place next week. That she had been to before, perhaps Assisted living. . If the patient does not have anything admittable, she would be discharged back to [place].  Called CP to confirm this with them.    Barriers to Discharge: No Barriers Identified   Patient Goals and CMS Choice        Expected Discharge Plan and Services  Back to [facility] after discharge by [facility]                                              Prior Living Arrangements/Services                       Activities of Daily Living      Permission Sought/Granted                  Emotional Assessment              Admission diagnosis:  Fall on eliquis Patient Active Problem List   Diagnosis Date Noted  . Declining functional status   . Goals of care, counseling/discussion   . Palliative care by specialist   . Aspiration pneumonia ([place]) 07/13/2020  . Hypoglycemia 07/13/2020  . Elevated troponin 07/13/2020  . Sepsis ([place]) 07/13/2020  . Syncope and collapse 06/22/2020  . COVID-19 virus infection 06/22/2020  . Hypokalemia 06/22/2020  . Pacemaker 05/06/2020  . Syncope 08/10/2019  . Displaced fracture of lateral malleolus of right fibula, initial encounter for closed fracture 07/10/2019  . Pain in left shoulder 07/10/2019  . Acute CHF (congestive heart failure) ([place]) 06/09/2019  . Acute pulmonary edema (HCC)   . Personal history of breast cancer 04/22/2019  . Pressure ulcer 01/04/2019  . Acute pancreatitis 01/02/2019  . Malignant neoplasm of lower-inner  quadrant of right breast of female, estrogen receptor positive ([place]) 11/23/2018  . Hematoma of right iliopsoas muscle 11/06/2018  . Intramuscular hematoma 11/06/2018  . Hypertensive heart disease with heart failure ([place]) 09/05/2017  . Aphasia 08/31/2017  . Dysphagia 08/31/2017  . CKD (chronic kidney disease), stage III ([place])   . LBBB (left bundle branch block)   . Anemia   . HTN (hypertension)   . Obesity   . DVT (deep venous thrombosis) ([place])   . DM (diabetes mellitus) ([place]) 07/28/2015  . Fall 07/28/2015  . Warfarin-induced coagulopathy ([place]) 07/28/2015  . Acute on chronic diastolic CHF (congestive heart failure) ([place]) 03/19/2014  . Edema 08/06/2013  . SVT (supraventricular tachycardia) ([place]) 10/15/2012  . First degree AV block 10/15/2012  . Acute renal insufficiency 10/14/2012  . Dizziness and giddiness   . Thrombocytopenia ([place]) 11/21/2011   PCP:  [name], MD Pharmacy:   [facility] [phone] - [place], [place] - [address] AT [place] [address] [place] [address]  Phone: [phone] Fax: [phone]     Social Determinants of Health (SDOH) Interventions    Readmission Risk Interventions Readmission Risk Prevention Plan 01/03/2019 01/02/2019  Transportation Screening (No Data) Complete  PCP or Specialist Appt within 3-5 Days - Complete  HRI or [place] - Complete  Social Work Consult for [place] Planning/Counseling - Complete  Palliative Care Screening - Not Applicable  Medication Review Press photographer) - Complete  Some recent data might be hidden

## 2020-09-06 NOTE — ED Notes (Signed)
Patient verbalizes understanding of discharge instructions. Opportunity for questioning and answers were provided. Armband removed by staff, pt discharged from ED via [name].

## 2020-09-06 NOTE — ED Provider Notes (Signed)
[facility] EMERGENCY DEPARTMENT Provider Note   CSN: [id number] Arrival date & time: 09/06/20  1231     History Chief Complaint  Patient presents with  . Fall    [name] is a 86 y.o. female.  Patient fell off of her wheelchair while trying to transfer.  Hit the back of her head on a doorknob.  No loss of consciousness.  She is on a blood thinner.  No laceration.  The history is provided by the patient.  Fall This is a new problem. The current episode started less than 1 hour ago. The problem has been resolved. Associated symptoms include headaches. Pertinent negatives include no chest pain, no abdominal pain and no shortness of breath. Associated symptoms comments: Left hip pain . Nothing aggravates the symptoms. Nothing relieves the symptoms. She has tried nothing for the symptoms. The treatment provided no relief.       Past Medical History:  Diagnosis Date  . Abnormal echocardiogram    a. possible mass on echo 05/2019 on PPM, b. no evidence of mass / atrial lead vegetation on follow up echo 06/2019  . Anemia   . Arthritis   . Breast cancer ([place]) 10/2018   Invasive ductal carcinoma  . Cancer ([place])   . Cataract   . Chronic combined systolic and diastolic CHF (congestive heart failure) ([place])    a. Previously diastolic but patient AVS from outside hospital listed systolic CHF and cardiomyopathy, records pending.  . CKD (chronic kidney disease), stage III ([place])   . Clotting disorder ([place])   . CVA (cerebral vascular accident) ([place])    residual right sided weakness and mild dysphagia  . Diabetes mellitus ([place])   . Dizziness and giddiness   . DVT (deep venous thrombosis) (HCC)    a. on anticoagulation for this.  . Essential hypertension   . LBBB (left bundle branch block)   . Mitral regurgitation    a. Mod MR by echo 2014.  . Muscular deconditioning   . Obesity   . Pacemaker   . Pericardial effusion   . SVT (supraventricular tachycardia) ([place])     a. In 2013 she had an EPS with ablation for SVT which did not eliminate the SVT completely. She also had bradycardia which limited medication. She was placed on amiodarone by Dr. Lovena Le.  . Thrombocytopenia ([place]) 11/21/2011    Patient Active Problem List   Diagnosis Date Noted  . Declining functional status   . Goals of care, counseling/discussion   . Palliative care by specialist   . Aspiration pneumonia ([place]) 07/13/2020  . Hypoglycemia 07/13/2020  . Elevated troponin 07/13/2020  . Sepsis ([place]) 07/13/2020  . Syncope and collapse 06/22/2020  . COVID-19 virus infection 06/22/2020  . Hypokalemia 06/22/2020  . Pacemaker 05/06/2020  . Syncope 08/10/2019  . Displaced fracture of lateral malleolus of right fibula, initial encounter for closed fracture 07/10/2019  . Pain in left shoulder 07/10/2019  . Acute CHF (congestive heart failure) ([place]) 06/09/2019  . Acute pulmonary edema (HCC)   . Personal history of breast cancer 04/22/2019  . Pressure ulcer 01/04/2019  . Acute pancreatitis 01/02/2019  . Malignant neoplasm of lower-inner quadrant of right breast of female, estrogen receptor positive ([place]) 11/23/2018  . Hematoma of right iliopsoas muscle 11/06/2018  . Intramuscular hematoma 11/06/2018  . Hypertensive heart disease with heart failure ([place]) 09/05/2017  . Aphasia 08/31/2017  . Dysphagia 08/31/2017  . CKD (chronic kidney disease), stage III ([place])   . LBBB (  left bundle branch block)   . Anemia   . HTN (hypertension)   . Obesity   . DVT (deep venous thrombosis) ([place])   . DM (diabetes mellitus) ([place]) 07/28/2015  . Fall 07/28/2015  . Warfarin-induced coagulopathy ([place]) 07/28/2015  . Acute on chronic diastolic CHF (congestive heart failure) ([place]) 03/19/2014  . Edema 08/06/2013  . SVT (supraventricular tachycardia) ([place]) 10/15/2012  . First degree AV block 10/15/2012  . Acute renal insufficiency 10/14/2012  . Dizziness and giddiness   . Thrombocytopenia ([place]) 11/21/2011     Past Surgical History:  Procedure Laterality Date  . EYE SURGERY    . SUPRAVENTRICULAR TACHYCARDIA ABLATION N/A 10/11/2012   Procedure: SUPRAVENTRICULAR TACHYCARDIA ABLATION;  Surgeon: Evans Lance, MD;  Location: [facility] CATH LAB;  Service: Cardiovascular;  Laterality: N/A;     OB History   No obstetric history on file.     Family History  Problem Relation Age of Onset  . Stroke Mother   . Cancer Father        prostate  . Hypertension Daughter   . Heart attack Brother        x2  . Hypertension Brother   . Stroke Sister   . Hypertension Sister   . Breast cancer Maternal Aunt     Social History   Tobacco Use  . Smoking status: Never Smoker  . Smokeless tobacco: Never Used  Vaping Use  . Vaping Use: Never used  Substance Use Topics  . Alcohol use: No  . Drug use: No    Home Medications Prior to Admission medications   Medication Sig Start Date End Date Taking? Authorizing Provider  acetaminophen (TYLENOL) 500 MG tablet Take 1,000 mg by mouth every 6 (six) hours as needed for mild pain, moderate pain or headache.     [provider]  albuterol (VENTOLIN HFA) 108 (90 Base) MCG/ACT inhaler Inhale 2 puffs into the lungs every 4 (four) hours as needed for wheezing or shortness of breath. 07/13/20   Charlesetta Shanks, MD  amiodarone (PACERONE) 200 MG tablet Take one tablet by mouth daily Monday-Saturday. Do NOT take Sunday 05/06/20   Evans Lance, MD  anastrozole (ARIMIDEX) 1 MG tablet TAKE 1 TABLET(1 MG) BY MOUTH DAILY 01/15/20   Nicholas Lose, MD  atorvastatin (LIPITOR) 20 MG tablet TAKE 1 TABLET(20 MG) BY MOUTH AT BEDTIME 08/18/20   Minette Brine, FNP  benzonatate (TESSALON) 100 MG capsule Take 1 capsule (100 mg total) by mouth every 8 (eight) hours. Swallow capsule whole.  Do not chew 07/13/20   Charlesetta Shanks, MD  cholecalciferol (VITAMIN D3) 25 MCG (1000 UNIT) tablet Take 1,000 Units by mouth daily.    [provider]  diclofenac sodium (VOLTAREN) 1 %  GEL Apply 2 g topically 4 (four) times daily. 10/30/18   Rodriguez-Southworth, Sunday Spillers, PA-C  ELIQUIS 2.5 MG TABS tablet TAKE 1 TABLET(2.5 MG) BY MOUTH TWICE DAILY 07/13/20   Dunn, Nedra Hai, PA-C  famotidine (PEPCID) 40 MG tablet Take 40 mg by mouth 2 (two) times daily. 05/22/20   [provider]  ferrous sulfate 325 (65 FE) MG tablet Take 325 mg by mouth daily with breakfast.    [provider]  HUMALOG KWIKPEN 200 UNIT/ML KwikPen INJECT 3 UNITS UNDER THE SKIN BEFORE BREAKFAST, LUNCH, DINNER IF SUGARS ARE GREATER THAN 175. 02/11/20   [name], MD  insulin detemir (LEVEMIR) 100 UNIT/ML injection Inject 0.15 mLs (15 Units total) into the skin at bedtime. 07/20/20   Aline August, MD  ipratropium (ATROVENT HFA) 17 MCG/ACT inhaler Inhale 2 puffs into the lungs every 6 (six) hours as needed for wheezing. 07/13/20   Charlesetta Shanks, MD  metoprolol succinate (TOPROL-XL) 25 MG 24 hr tablet Take 1 tablet (25 mg total) by mouth daily. 07/21/20   Aline August, MD  mirtazapine (REMERON) 7.5 MG tablet TAKE 1 TABLET(7.5 MG) BY MOUTH AT BEDTIME 04/14/20   [name], MD  MYRBETRIQ 50 MG TB24 tablet Take 50 mg by mouth daily. 05/24/20   [provider]  omeprazole (PRILOSEC) 20 MG capsule Take 20 mg by mouth daily.    [provider]  oxybutynin (DITROPAN) 5 MG tablet TAKE 1 TABLET(5 MG) BY MOUTH TWICE DAILY 12/23/19   [name], MD  pregabalin (LYRICA) 75 MG capsule Take 1 capsule (75 mg total) by mouth at bedtime. 06/30/20   Minette Brine, FNP  Semaglutide,0.25 or 0.5MG /DOS, (OZEMPIC, 0.25 OR 0.5 MG/DOSE,) 2 MG/1.5ML SOPN Inject 0.25 mg into the skin once a week. Take on Wednesday    [provider]  torsemide (DEMADEX) 20 MG tablet Take 1 tablet (20 mg total) by mouth daily. You may take an extra 20 mg tablet daily as needed for swelling 07/20/20   Aline August, MD  vitamin B-12 (CYANOCOBALAMIN) 1000 MCG tablet Take 1,000 mcg by mouth daily.    [provider]    Allergies    Aspirin and Penicillins  Review of Systems   Review of Systems  Constitutional: Negative for chills and fever.  HENT: Negative for ear pain and sore throat.   Eyes: Negative for pain and visual disturbance.  Respiratory: Negative for cough and shortness of breath.   Cardiovascular: Negative for chest pain and palpitations.  Gastrointestinal: Negative for abdominal pain and vomiting.  Genitourinary: Negative for dysuria and hematuria.  Musculoskeletal: Negative for arthralgias and back pain.  Skin: Negative for color change and rash.  Neurological: Positive for headaches. Negative for seizures and syncope.  All other systems reviewed and are negative.   Physical Exam Updated Vital Signs BP (!) 144/80   Pulse 66   Temp 98.5 F (36.9 C) (Oral)   Resp 19   SpO2 99%   Physical Exam Vitals and nursing note reviewed.  Constitutional:      General: She is not in acute distress.    Appearance: She is well-developed. She is not ill-appearing.  HENT:     Head: Normocephalic and atraumatic.     Nose: Nose normal.     Mouth/Throat:     Mouth: Mucous membranes are moist.  Eyes:     Extraocular Movements: Extraocular movements intact.     Conjunctiva/sclera: Conjunctivae normal.     Pupils: Pupils are equal, round, and reactive to light.  Cardiovascular:     Rate and Rhythm: Normal rate and regular rhythm.     Pulses: Normal pulses.     Heart sounds: Normal heart sounds. No murmur heard.   Pulmonary:     Effort: Pulmonary effort is normal. No respiratory distress.     Breath sounds: Normal breath sounds.  Abdominal:     General: Abdomen is flat.     Palpations: Abdomen is soft.     Tenderness: There is no abdominal tenderness.  Musculoskeletal:        General: Normal range of motion.     Cervical back: Neck supple.  Skin:    General: Skin is warm and dry.     Capillary Refill: Capillary refill takes less than 2 seconds.  Neurological:     General: No  focal deficit present.     Mental Status: She is alert and oriented to person, place, and time.     Cranial Nerves: No cranial nerve deficit.     Sensory: No sensory deficit.     Coordination: Coordination normal.  Psychiatric:        Mood and Affect: Mood normal.     ED Results / Procedures / Treatments   Labs (all labs ordered are listed, but only abnormal results are displayed) Labs Reviewed - No data to display  EKG EKG Interpretation  Date/Time:  Sunday September 06 2020 12:39:58 EST Ventricular Rate:  77 PR Interval:    QRS Duration: 176 QT Interval:  473 QTC Calculation: 536 R Axis:   -70 Text Interpretation: Sinus rhythm Left bundle branch block Confirmed by Lennice Sites [phone]) on 09/06/2020 12:44:08 PM   Radiology CT Head Wo Contrast  Result Date: 09/06/2020 CLINICAL DATA:  Head trauma. Fall. Hit back of head on door. On Eliquis. EXAM: CT HEAD WITHOUT CONTRAST CT CERVICAL SPINE WITHOUT CONTRAST TECHNIQUE: Multidetector CT imaging of the head and cervical spine was performed following the standard protocol without intravenous contrast. Multiplanar CT image reconstructions of the cervical spine were also generated. COMPARISON:  Head CT 07/13/2020.  Cervical spine CT 08/10/2019. FINDINGS: CT HEAD FINDINGS Brain: There is no evidence of an acute infarct, intracranial hemorrhage, mass, midline shift, or extra-axial fluid collection. The ventricles and sulci are within normal limits for age. Hypodensities in the cerebral white matter bilaterally are nonspecific but compatible with minimal chronic small vessel ischemic disease. Vascular: Calcified atherosclerosis at the skull base. No hyperdense vessel. Skull: No fracture or suspicious osseous lesion. Sinuses/Orbits: Chronic opacification of a posterior left ethmoid air cell. Clear mastoid air cells. Right cataract extraction. Other: None. CT CERVICAL SPINE FINDINGS Alignment: Chronic grade 1 at retrolisthesis of C3 on C4 which  has mildly progressed and is degenerative in appearance. Chronic grade 1 anterolisthesis of C5 on C6, C6 on C7, C7 on T1, and T2 on T3, not significantly changed and also degenerative in appearance. Skull base and vertebrae: No acute fracture or suspicious osseous lesion. Soft tissues and spinal canal: No prevertebral fluid or swelling. No visible canal hematoma. Disc levels: Progressive severe disc degeneration at C3-4 with severe disc space narrowing, degenerative endplate irregularity and sclerosis with Schmorl's nodes, and a broad posterior disc herniation resulting in severe spinal stenosis. Chronic severe left C5-6 facet arthrosis with erosions. Severe left neural foraminal stenosis at C5-6 due to anterolisthesis, uncovertebral spurring, and facet arthrosis. Upper chest: Mild interlobular septal thickening and mild mosaic attenuation in the lung apices. Other: Minimal calcific atherosclerosis at the carotid bifurcations. IMPRESSION: 1. No evidence of acute intracranial abnormality. 2. No evidence of acute fracture or traumatic subluxation in the cervical spine. 3. Progressive severe disc degeneration at C3-4 with severe spinal stenosis. 4. Mild interlobular septal thickening and mosaic attenuation in the lung apices, query mild edema. Electronically Signed   By: Logan Bores M.D.   On: 09/06/2020 14:14   CT Cervical Spine Wo Contrast  Result Date: 09/06/2020 CLINICAL DATA:  Head trauma. Fall. Hit back of head on door. On Eliquis. EXAM: CT HEAD WITHOUT CONTRAST CT CERVICAL SPINE WITHOUT CONTRAST TECHNIQUE: Multidetector CT imaging of the head and cervical spine was performed following the standard protocol without intravenous contrast. Multiplanar CT image reconstructions of the cervical spine were also generated. COMPARISON:  Head CT 07/13/2020.  Cervical spine CT 08/10/2019.  FINDINGS: CT HEAD FINDINGS Brain: There is no evidence of an acute infarct, intracranial hemorrhage, mass, midline shift, or  extra-axial fluid collection. The ventricles and sulci are within normal limits for age. Hypodensities in the cerebral white matter bilaterally are nonspecific but compatible with minimal chronic small vessel ischemic disease. Vascular: Calcified atherosclerosis at the skull base. No hyperdense vessel. Skull: No fracture or suspicious osseous lesion. Sinuses/Orbits: Chronic opacification of a posterior left ethmoid air cell. Clear mastoid air cells. Right cataract extraction. Other: None. CT CERVICAL SPINE FINDINGS Alignment: Chronic grade 1 at retrolisthesis of C3 on C4 which has mildly progressed and is degenerative in appearance. Chronic grade 1 anterolisthesis of C5 on C6, C6 on C7, C7 on T1, and T2 on T3, not significantly changed and also degenerative in appearance. Skull base and vertebrae: No acute fracture or suspicious osseous lesion. Soft tissues and spinal canal: No prevertebral fluid or swelling. No visible canal hematoma. Disc levels: Progressive severe disc degeneration at C3-4 with severe disc space narrowing, degenerative endplate irregularity and sclerosis with Schmorl's nodes, and a broad posterior disc herniation resulting in severe spinal stenosis. Chronic severe left C5-6 facet arthrosis with erosions. Severe left neural foraminal stenosis at C5-6 due to anterolisthesis, uncovertebral spurring, and facet arthrosis. Upper chest: Mild interlobular septal thickening and mild mosaic attenuation in the lung apices. Other: Minimal calcific atherosclerosis at the carotid bifurcations. IMPRESSION: 1. No evidence of acute intracranial abnormality. 2. No evidence of acute fracture or traumatic subluxation in the cervical spine. 3. Progressive severe disc degeneration at C3-4 with severe spinal stenosis. 4. Mild interlobular septal thickening and mosaic attenuation in the lung apices, query mild edema. Electronically Signed   By: Logan Bores M.D.   On: 09/06/2020 14:14   DG Pelvis Portable  Result  Date: 09/06/2020 CLINICAL DATA:  Fall at home. EXAM: PORTABLE PELVIS 1-2 VIEWS COMPARISON:  08/10/2019 FINDINGS: No acute pelvic fracture or diastasis is visualized. Stable degenerative osteoarthritis of both hip joints. No bony lesions or destruction identified. IMPRESSION: No acute findings. Electronically Signed   By: Aletta Edouard M.D.   On: 09/06/2020 13:41    Procedures Procedures (including critical care time)  Medications Ordered in ED Medications - No data to display  ED Course  I have reviewed the triage vital signs and the nursing notes.  Pertinent labs & imaging results that were available during my care of the patient were reviewed by me and considered in my medical decision making (see chart for details).    MDM Rules/Calculators/A&P                          Terianna SYLVANA BONK is an 86 year old female with DVT on blood thinner who presents to the ED after fall off of her wheelchair hitting her head.  Did not lose consciousness.  But is on a blood thinner.  Normal vitals otherwise.  May be having some hip pain after the fall on the left side.  Otherwise appears neurologically intact.  No laceration.  Has been fairly wheelchair-bound since having Covid.  However used to ambulate with a walker before then.  No longer getting physical therapy at her nursing facility and family thinks that she would benefit from more physical therapy therefore will place case management consult for home health needs.  Will get CT scan of head and neck to further evaluate for injury given that she is on blood thinners.  CT imaging x-rays unremarkable.  Discharged back  to facility.  Social work and case management are engaged for hopefully home health and physical therapy.  This chart was dictated using voice recognition software.  Despite best efforts to proofread,  errors can occur which can change the documentation meaning.     Final Clinical Impression(s) / ED Diagnoses Final diagnoses:  Fall,  initial encounter    Rx / DC Orders ED Discharge Orders    None       Lennice Sites, DO 09/06/20 1729

## 2020-09-06 NOTE — ED Triage Notes (Signed)
Pt bib [facility] from [facility] w/ c/o fall. Pt fell while getting out of wheelchair and hit head on back of door. Pt is on eliquis. Pt neuro intact, no injury or trauma noted. EMS VSS.

## 2020-09-07 DIAGNOSIS — H40033 Anatomical narrow angle, bilateral: Secondary | ICD-10-CM | POA: Diagnosis not present

## 2020-09-07 DIAGNOSIS — H1013 Acute atopic conjunctivitis, bilateral: Secondary | ICD-10-CM | POA: Diagnosis not present

## 2020-09-11 DIAGNOSIS — D519 Vitamin B12 deficiency anemia, unspecified: Secondary | ICD-10-CM | POA: Diagnosis not present

## 2020-09-11 DIAGNOSIS — R0602 Shortness of breath: Secondary | ICD-10-CM | POA: Diagnosis not present

## 2020-09-11 DIAGNOSIS — R2681 Unsteadiness on feet: Secondary | ICD-10-CM | POA: Diagnosis not present

## 2020-09-11 DIAGNOSIS — Z760 Encounter for issue of repeat prescription: Secondary | ICD-10-CM | POA: Diagnosis not present

## 2020-09-11 DIAGNOSIS — E119 Type 2 diabetes mellitus without complications: Secondary | ICD-10-CM | POA: Diagnosis not present

## 2020-09-11 DIAGNOSIS — I5032 Chronic diastolic (congestive) heart failure: Secondary | ICD-10-CM | POA: Diagnosis not present

## 2020-09-11 DIAGNOSIS — M6281 Muscle weakness (generalized): Secondary | ICD-10-CM | POA: Diagnosis not present

## 2020-09-11 DIAGNOSIS — R1311 Dysphagia, oral phase: Secondary | ICD-10-CM | POA: Diagnosis not present

## 2020-09-11 DIAGNOSIS — I480 Paroxysmal atrial fibrillation: Secondary | ICD-10-CM | POA: Diagnosis not present

## 2020-09-11 DIAGNOSIS — Z9181 History of falling: Secondary | ICD-10-CM | POA: Diagnosis not present

## 2020-09-11 DIAGNOSIS — I69391 Dysphagia following cerebral infarction: Secondary | ICD-10-CM | POA: Diagnosis not present

## 2020-09-11 DIAGNOSIS — R278 Other lack of coordination: Secondary | ICD-10-CM | POA: Diagnosis not present

## 2020-09-11 DIAGNOSIS — R112 Nausea with vomiting, unspecified: Secondary | ICD-10-CM | POA: Diagnosis not present

## 2020-09-11 DIAGNOSIS — R5381 Other malaise: Secondary | ICD-10-CM | POA: Diagnosis not present

## 2020-09-11 DIAGNOSIS — R2689 Other abnormalities of gait and mobility: Secondary | ICD-10-CM | POA: Diagnosis not present

## 2020-09-11 DIAGNOSIS — E118 Type 2 diabetes mellitus with unspecified complications: Secondary | ICD-10-CM | POA: Diagnosis not present

## 2020-09-11 DIAGNOSIS — Z79899 Other long term (current) drug therapy: Secondary | ICD-10-CM | POA: Diagnosis not present

## 2020-09-11 DIAGNOSIS — K219 Gastro-esophageal reflux disease without esophagitis: Secondary | ICD-10-CM | POA: Diagnosis not present

## 2020-09-12 DIAGNOSIS — I480 Paroxysmal atrial fibrillation: Secondary | ICD-10-CM | POA: Diagnosis not present

## 2020-09-12 DIAGNOSIS — E119 Type 2 diabetes mellitus without complications: Secondary | ICD-10-CM | POA: Diagnosis not present

## 2020-09-12 DIAGNOSIS — I5032 Chronic diastolic (congestive) heart failure: Secondary | ICD-10-CM | POA: Diagnosis not present

## 2020-09-14 DIAGNOSIS — E119 Type 2 diabetes mellitus without complications: Secondary | ICD-10-CM | POA: Diagnosis not present

## 2020-09-15 DIAGNOSIS — I480 Paroxysmal atrial fibrillation: Secondary | ICD-10-CM | POA: Diagnosis not present

## 2020-09-15 DIAGNOSIS — R5381 Other malaise: Secondary | ICD-10-CM | POA: Diagnosis not present

## 2020-09-15 DIAGNOSIS — E119 Type 2 diabetes mellitus without complications: Secondary | ICD-10-CM | POA: Diagnosis not present

## 2020-09-15 DIAGNOSIS — R112 Nausea with vomiting, unspecified: Secondary | ICD-10-CM | POA: Diagnosis not present

## 2020-09-16 ENCOUNTER — Ambulatory Visit: Payer: Medicare Other | Admitting: Podiatry

## 2020-09-16 DIAGNOSIS — D519 Vitamin B12 deficiency anemia, unspecified: Secondary | ICD-10-CM | POA: Diagnosis not present

## 2020-09-18 DIAGNOSIS — I5032 Chronic diastolic (congestive) heart failure: Secondary | ICD-10-CM | POA: Diagnosis not present

## 2020-09-18 DIAGNOSIS — R5381 Other malaise: Secondary | ICD-10-CM | POA: Diagnosis not present

## 2020-09-18 DIAGNOSIS — E119 Type 2 diabetes mellitus without complications: Secondary | ICD-10-CM | POA: Diagnosis not present

## 2020-09-18 DIAGNOSIS — I480 Paroxysmal atrial fibrillation: Secondary | ICD-10-CM | POA: Diagnosis not present

## 2020-09-19 DIAGNOSIS — Z79899 Other long term (current) drug therapy: Secondary | ICD-10-CM | POA: Diagnosis not present

## 2020-09-25 DIAGNOSIS — E119 Type 2 diabetes mellitus without complications: Secondary | ICD-10-CM | POA: Diagnosis not present

## 2020-09-26 DIAGNOSIS — R0602 Shortness of breath: Secondary | ICD-10-CM | POA: Diagnosis not present

## 2020-09-28 ENCOUNTER — Ambulatory Visit: Payer: Medicare Other

## 2020-09-28 DIAGNOSIS — M6281 Muscle weakness (generalized): Secondary | ICD-10-CM | POA: Diagnosis not present

## 2020-09-28 DIAGNOSIS — E118 Type 2 diabetes mellitus with unspecified complications: Secondary | ICD-10-CM | POA: Diagnosis not present

## 2020-09-28 DIAGNOSIS — I5032 Chronic diastolic (congestive) heart failure: Secondary | ICD-10-CM

## 2020-09-28 DIAGNOSIS — R2681 Unsteadiness on feet: Secondary | ICD-10-CM | POA: Diagnosis not present

## 2020-09-28 DIAGNOSIS — E1122 Type 2 diabetes mellitus with diabetic chronic kidney disease: Secondary | ICD-10-CM

## 2020-09-28 DIAGNOSIS — Z9181 History of falling: Secondary | ICD-10-CM | POA: Diagnosis not present

## 2020-09-28 DIAGNOSIS — R278 Other lack of coordination: Secondary | ICD-10-CM | POA: Diagnosis not present

## 2020-09-28 NOTE — Chronic Care Management (AMB) (Signed)
Chronic Care Management    Social Work Follow Up Note  09/28/2020 Name: [name] MRN: [id number] DOB: Jan 13, 1933  [name] is a 85 y.o. year old female who is a primary care patient of Stephanie Chard, MD. The CCM team was consulted for assistance with care coordination.   Review of patient status, including review of consultants reports, other relevant assessments, and collaboration with appropriate care team members and the patient's provider was performed as part of comprehensive patient evaluation and provision of chronic care management services.    SW placed a successful outbound call to the patients daughter to assess for care coordination needs. Determined the patient is now living at [facility] in [facility]. [name] reports the patient has switched to using the in house physician as the patients PCP. [name] requests a referral to orthopedic. SW advised [name] the patient may need this referral from the in house provider as [name] has not seen the patient in several months. [name] requests follow up from [name] office stating "they referred her to someone in the past and I want her to have that same doctor". Collaboration with [name] who reports someone from the office will follow up with the patient and her daughter.    Outpatient Encounter Medications as of 09/28/2020  Medication Sig  . acetaminophen (TYLENOL) 500 MG tablet Take 1,000 mg by mouth every 6 (six) hours as needed for mild pain, moderate pain or headache.   . albuterol (VENTOLIN HFA) 108 (90 Base) MCG/ACT inhaler Inhale 2 puffs into the lungs every 4 (four) hours as needed for wheezing or shortness of breath.  [name] amiodarone (PACERONE) 200 MG tablet Take one tablet by mouth daily Monday-Saturday. Do NOT take Sunday  . anastrozole (ARIMIDEX) 1 MG tablet TAKE 1 TABLET(1 MG) BY MOUTH DAILY  . atorvastatin (LIPITOR) 20 MG tablet TAKE 1 TABLET(20 MG) BY MOUTH AT BEDTIME  . benzonatate  (TESSALON) 100 MG capsule Take 1 capsule (100 mg total) by mouth every 8 (eight) hours. Swallow capsule whole.  Do not chew  . cholecalciferol (VITAMIN D3) 25 MCG (1000 UNIT) tablet Take 1,000 Units by mouth daily.  . diclofenac sodium (VOLTAREN) 1 % GEL Apply 2 g topically 4 (four) times daily.  [name] ELIQUIS 2.5 MG TABS tablet TAKE 1 TABLET(2.5 MG) BY MOUTH TWICE DAILY  . famotidine (PEPCID) 40 MG tablet Take 40 mg by mouth 2 (two) times daily.  . ferrous sulfate 325 (65 FE) MG tablet Take 325 mg by mouth daily with breakfast.  . HUMALOG KWIKPEN 200 UNIT/ML KwikPen INJECT 3 UNITS UNDER THE SKIN BEFORE BREAKFAST, LUNCH, DINNER IF SUGARS ARE GREATER THAN 175.  . insulin detemir (LEVEMIR) 100 UNIT/ML injection Inject 0.15 mLs (15 Units total) into the skin at bedtime.  [name] ipratropium (ATROVENT HFA) 17 MCG/ACT inhaler Inhale 2 puffs into the lungs every 6 (six) hours as needed for wheezing.  . metoprolol succinate (TOPROL-XL) 25 MG 24 hr tablet Take 1 tablet (25 mg total) by mouth daily.  . mirtazapine (REMERON) 7.5 MG tablet TAKE 1 TABLET(7.5 MG) BY MOUTH AT BEDTIME  . MYRBETRIQ 50 MG TB24 tablet Take 50 mg by mouth daily.  [name] omeprazole (PRILOSEC) 20 MG capsule Take 20 mg by mouth daily.  [name] oxybutynin (DITROPAN) 5 MG tablet TAKE 1 TABLET(5 MG) BY MOUTH TWICE DAILY  . pregabalin (LYRICA) 75 MG capsule Take 1 capsule (75 mg total) by mouth at bedtime.  . Semaglutide,0.25 or 0.5MG/DOS, (OZEMPIC, 0.25 OR 0.5  MG/DOSE,) 2 MG/1.5ML SOPN Inject 0.25 mg into the skin once a week. Take on Wednesday  . torsemide (DEMADEX) 20 MG tablet Take 1 tablet (20 mg total) by mouth daily. You may take an extra 20 mg tablet daily as needed for swelling  . vitamin B-12 (CYANOCOBALAMIN) 1000 MCG tablet Take 1,000 mcg by mouth daily.   No facility-administered encounter medications on file as of 09/28/2020.     The below goals have been closed due to the patient no longer being eligible for embedded care management program. SW  collaboration with [name] requesting a new referral if the patient returns home and she feels the patient is appropriate for enrollment.  Goals Addressed              This Visit's Progress     Patient Stated   .  COMPLETED: "I am having pain in my ankle" (pt-stated)        Current Barriers:  [name] Knowledge Deficits related to diagnosis and treatment of right ankle pain and swelling . 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 New 04/06/20 Over the next 30 days, patient will have received her orthotic foot brace and will verbalize she is wearing this brace as directed  CCM RN CM Interventions:  04/06/20 call completed with patient and daughter [name] . Evaluation of current treatment plan related to ankle pain and swelling and patient's adherence to plan as established by provider . Determined patient completed her PT and the ankle fracture has healed; Determined she continues to suffer from bilateral knee pain and it was recommended for her to wear an orthotic knee brace to help correct her leg rotation . Determined patient completed an Ortho visit at [facility] on 01/10/20 with [name] with the following Assessment/Plan noted; o Assessment & Plan: o Visit Diagnoses:  o 1. 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  With all her medical problems she is not really a candidate for total knee arthroplasty.  Prescription for brace given for knee brace with metal upright.  In the past when she tried a knee immobilizer she states that road up on her she had problems taking it on and off.  She principally applies the knee sleeve when she stands or  transfers.  Follow-up here as needed. o Follow-Up Instructions: No follow-ups on file.  o Orders:  o  o  o  Orders Placed This Encounter o  Procedures o  . o XR KNEE 3 VIEW RIGHT o    . Determined she was fitted for an orthotic knee brace and will be notified once it is ready . Assessed for falls and DME needs, patient denies having falls, is using a cane and walker to help with ambulation  . Discussed plans with patient for ongoing care management follow up and provided patient with direct contact information for care management team  Patient Self Care Activities:  . Self administers medications as prescribed . Attends all scheduled provider appointments . Calls pharmacy for medication refills . Performs ADL's independently . Performs IADL's independently . Calls provider office for new concerns or questions  Please see past updates related to this  goal by clicking on the "Past Updates" button in the selected goal      .  COMPLETED: "I check my sugars three times a day" (pt-stated)        Current Barriers:  [name] Knowledge Deficits related to disease process and Self Health management for Diabetes  Nurse Case Manager Clinical Goal(s):  [name] Over the next 30 days, patient will verbalize basic understanding of Diabetes disease process and self health management plan as evidenced by patient will verbalize how to meal plan and follow a low carb Diabetic diet, monitor CBG's, take insulin exactly as prescribed and verbalize knowing when to call the doctor. Goal Met . 06/14/19 Over the next 30 days, patient will report having no hypo/hyperglycemic episodes; <80 and or >250 Goal Met  . 06/14/19 Over the next 90 days, patient will lower her A1C less than 7.8 Goal Not Met  . New 04/06/20 Over the next 60 days, patient will verbalize modifying her diet to better adhere to ADA diet recommendations to help improve her daily glycemic control and lower her A1c . New 04/06/20 Over the next 90 days, patient  will lower her A1c <8.0 %   CCM RN CM Interventions:  04/06/20 call completed with patient and daughter [name] . Evaluation of current treatment plan related to diabetes and patient's adherence to plan as established by provider . Reviewed and discussed patient's A1c is up to 8.9 from 7.9 obtained on 04/02/20; Determined patient needs a new glucometer, in basket message sent to [name]; Determined patient reports adherence to following her prescribed treatment plan for her diabetes, although she admits she can do better with her diet and is willing to eat better; reports no recent lows, continues to monitor CBG before meals and is logging; reports FBS are ranging in the 200's some days; patient verbalizes understanding as to when to call the CCM team and or provider if needed for hypo/hyperglycemic episodes . Education patient on daily glycemic control FBS 80-130, <180 after meals; educated on target A1c <7.0; Educated on 15'15' rule  . Reviewed medications with patient including DM regimen, patient has a good understanding, reports adherence; Sent embedded Pharm D referral for medication review and for improved  management of polypharmacy  . Advised patient, providing education and rationale, to continue to check FBS before meals and record, calling the CCM team for findings outside established parameters . Mailed printed educational materials related to Diabetes management using the Plate Method, Grocery Shopping with Diabetes; Diabetes Care Guide; Diabetes and Kidney disease; Carb Counting, Carb Choices . Discussed plans with patient for ongoing care management follow up and provided patient with direct contact information for care management team  Patient Self Care Activities with assistance from daughter  [name] understanding of the education/information provided today  . Self administers medications as prescribed . Attends all scheduled provider appointments . Calls pharmacy for  medication refills . Performs ADL's independently . Performs IADL's independently . Calls provider office for new concerns or questions  Please see past updates related to this goal by clicking on the "Past Updates" button in the selected goal       .  COMPLETED: "I want a second opinion about my [name]" (pt-stated)        Current Barriers:  [name] Knowledge Deficits related to treatment management for malignant neoplasm of lower-inner quadrant or right breast  . 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 Goal Met . Over the next 30 days, patient will attend all scheduled medical appointments: including follow up at [facility] for 2nd opinion of breast neoplasm scheduled for 03/04/19. Goal Met . 06/14/19: Over the next 30 days, patient will have increased knowledge and understanding of the plan for surgical consultation of her right breast and lymph nodes secondary to malignancy Goal Met  . New 04/06/20 Over the next 90 days, patient will verbalize increased knowledge and understanding about her [name] diagnosis, prognosis and potential complications that may occur as a result of her treatment and or disease process   CCM RN CM Interventions:  04/06/20 call completed with patient and daughter [name] . Evaluation of current treatment plan related to diagnosis and treatment for right breast neoplasm and patient's adherence to plan as established by provider  Determined patient completed an Oncology visit with Dr. Lindi Adie from [facility] on 11/12/19   Reviewed and discussed the following Assessment/Plan noted;   ASSESSMENT & PLAN:   Malignant neoplasm of lower-inner  quadrant of right breast of female, estrogen receptor positive ([place])  11/19/2018:Palpable right breast mass with calcifications, mammogram revealed focal asymmetry lower right breast 5:00 measuring 4.6 cm, ultrasound revealed overall size of 5.7 cm ultrasound-guided biopsy revealed grade 2 IDC ER 90% PR 80% Ki-67 5%, HER-2 negative, T3N0 stage IIa clinical stage  Treatment plan:  1.  Neoadjuvant antiestrogen therapy with anastrozole started 11/28/2018  2.  Patient went to [facility] and they felt that she could undergo surgery.  She was seen by Dr. Donne Hazel recently.    Mammogram and ultrasound 09/06/2019: Decrease in size of the mass from 4.6 cm to 0.5 cm, no axillary lymph nodes  We discussed different options and patient is leaning towards watchful monitoring on hormonal therapy  Anastrozole toxicities: hair loss  RTC in 6 months with Bil mammograms and ultrasound of Rt breast  The patient has a good understanding of the overall plan. she agrees with it. she will call with any problems that may develop before the next visit here  Reviewed and discussed next OV with Dr. Lindi Adie is scheduled for 05/11/20 '@11' :00 AM  Discussed plans with patient for ongoing care management follow up and provided patient with direct contact information for care management team  Patient Self Care Activities with assistance from daughter . Self administers medications as prescribed . Attends all scheduled provider appointments . Calls pharmacy for medication refills . Performs ADL's independently . Performs IADL's independently . Calls provider office for new concerns or questions  Please see past updates related to this goal by clicking on the "Past Updates" button in the selected goal       .  COMPLETED: "I would like to improve my kidney function" (pt-stated)        CARE PLAN ENTRY (see longitudinal plan of care for additional care plan information)  Current Barriers:  [name] Knowledge Deficits  related to disease process and Self Health management of CKD . 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 Inter-disciplinary care team collaboration (see longitudinal plan of care) . Evaluation of current treatment plan related to CKD and patient's adherence to plan as established by provider. . Provided education to patient re: GFR from recent blood draw; Educated on the stages of CKD and how to improve renal function with better DM and BP control, maintaining ideal body weight, drinking plenty of fluids, staying active by implementing a routine exercise regimen as tolerated  . Discussed plans with patient for ongoing care management follow up and provided patient with direct contact information for care management team . Provided patient with printed educational materials related to stages of CKD   Patient Self Care Activities:  . Self administers medications as prescribed . Attends all scheduled provider appointments . Calls pharmacy for medication refills . Calls provider office for new concerns or questions . Supportive daughter to assist with care needs  Initial goal documentation       Other   .  COMPLETED: Pharmacy Care Plan        CARE PLAN ENTRY (see longitudinal plan of care for additional care plan information)  Current Barriers:  . Chronic Disease Management support, education, and care coordination needs related to Hypertension and Diabetes   Hypertension BP Readings from Last 3 Encounters:  07/20/20 (!) 147/73  07/01/20 (!) 116/59  06/21/20 (!) 156/64   . Pharmacist Clinical  Goal(s): o Over the next 90 days, patient will work with PharmD and providers to achieve BP goal <130/80 . Current regimen:  o Carvedilol 12.68m twice daily o Torsemide 20 twice daily, may take an extra tablet daily if needed for swelling o Bidil 20-37.544mthree times daily . Interventions: o Provided dietary and exercise recommendations o Recommend chair exercises o Assist patient with obtaining home blood pressure monitor if needed . Patient self care activities - Over the next 90 days, patient will: o Check BP once blood pressure monitored obtained, document, and provide at future appointments o Ensure daily salt intake < 2300 mg/day o Increase exercise as able with a goal of 30 minutes 5 times weekly - Chair exercises  Diabetes Lab Results  Component Value Date/Time   HGBA1C 9.0 (H) 07/14/2020 09:00 AM   HGBA1C 9.1 (H) 06/22/2020 04:21 PM   . Pharmacist Clinical Goal(s): o Over the next 90 days, patient will work with PharmD and providers to achieve A1c goal <7% . Current regimen:  o Humalog kwikpen 200 unit/ml 3 units before meals if blood sugar greater than 175 per sliding scale  o Levemir 16 units in the am and 10 units in the pm . Interventions: o Provided dietary and exercise recommendations o Will assist with patient assistance application for insulin . Patient self care activities - Over the next 90 days, patient will: o Check blood sugar twice daily, document, and provide at future appointments o Contact provider with any episodes of hypoglycemia o Increase exercise as able with a goal of 30 minutes 5 times weekly - Chair exercises  Medication management . Pharmacist Clinical Goal(s): o Over the next 90 days, patient will work with PharmD and providers to achieve optimal medication adherence . Current pharmacy: [facility] . Interventions o Comprehensive medication review performed. o Continue current medication management strategy . Patient self care activities -  Over the next 90 days, patient will: o Focus on medication adherence by utilizing a pill box o  Take medications as prescribed o Report any questions or concerns to PharmD and/or provider(s)  Initial goal documentation     .  COMPLETED: .High Risk for Knowledge Deficit for CHF         Current Barriers:  [name] Knowledge Deficits related to disease process and Self Health management for CHF  . 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 04/04/19 re-established goal date to 60 days due to treatment delays secondary to Corona virus - Goal Met  . New - 07/05/19 Over the next 60 days, patient will experience no IP or ED events secondary to exacerbation of CHF Goal Met  . New 04/06/20 Over the next 30 days, patient will increase her adherence to obtaining daily weights and recording them as directed . New 04/06/20 Over the next 90 days, patient will contact her health care team to report symptoms suggestive of CHF promptly, as directed to help improve Self Health management of CHF w/o disease exacerbation  CCM RN CM Interventions:  04/06/20 completed call with patient and daughter [name]  . Evaluation of current treatment plan related to CHF and patient's adherence to plan as established by provider . Provided education to patient and daughter re: importance of performing daily weights each am after voiding and wearing the smallest amount of clothing; discussed recording weights daily  . Discussed when to call the doctor for  weight gain of 3 lbs in 1 day and or 5 lbs in 1 week . Reviewed and discussed other s/s suggestive of CHF exacerbation and when to call the doctor; new or worsening fatigue, dry cough, shortness of breath, swelling to face, hands, abdomen and lower extremities, nausea/vomiting with abdominal swelling  . Reviewed and discussed Cardiology visit completed on 01/14/20 with Dr. Irish Lack with the following Assessment/Plan completed;  o ASSESSMENT AND PLAN: 1. Chronic diastolic heart failure: Can increase torsemide to take an extra pill when she feels increased leg swelling. 2. Hypertensive heart disease: Adequately controlled.  3. Lower extremity edema: Elevate legs.  Compression stockings being used.  4. 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 5. Anticoagulated: No bleeding issues.  Now on lower dose Eliquis. 6. Pacemaker, there was a concern for a lead infection.  She was seen by Dr. Lovena Le.  Pacer was functioning normally.  Due to lack of symptoms of infection, no TEE was done.  No fevers or chills at this time. 7. COVID vaccine recommended. o Current medicines are reviewed at length with the patient today.  The patient concerns regarding her medicines were addressed. o The following changes have been made:ok to take increased torsemide o Labs/ tests ordered today include:  o Recommend 150 minutes/week of aerobic exercise o Low fat, low carb, high fiber diet recommended o Disposition:   FU in 1 year  Reviewed and discussed the following reported symptoms reported by patient to Cardiology on 03/12/20 and the recommendations provided;   patient reported that she has been getting more SOB this week. She states that it only occurs with exertion. She has not been up and moving around  as often and gets SOB when wheeling herself around in her wheelchair. She denies any chest pain. She states that she does have some swelling in her legs. Her daughter states that the swelling has  not increased. She reports that she does elevate her legs and use her compression hose.   I advised the patient that she could take an extra 68m of torsemide daily for increased swelling  Discussed plans with patient for ongoing care management follow up and provided patient with direct contact information for care management team  Mailed printed educational material to patient related to CHF zone safety tool  Patient Self Care Activities:  . Attends all scheduled provider appointments . Calls pharmacy for medication refills . Calls provider office for new concerns or questions . Supportive daughter to assist with care needs  Please see past updates related to this goal by clicking on the "Past Updates" button in the selected goal           Follow Up Plan: No follow up planned at this time. The patient is no longer eligible for embedded care management due to switching primary care providers.   KDaneen Schick BSW, CDP Social Worker, Certified Dementia Practitioner [facility] [phone]

## 2020-09-29 ENCOUNTER — Encounter: Payer: Self-pay | Admitting: Internal Medicine

## 2020-09-30 ENCOUNTER — Telehealth: Payer: Self-pay

## 2020-09-30 NOTE — Telephone Encounter (Signed)
Biotronik alert received for increased Atrial burden- 67% of day on 12/7. Ventricular rates appear controlled.   Pt has known history of AF.  Current meds include Amiodarone 200mg  Monday-Saturday (hold on Sunday); Metoprolol 25mg  daily;  Eliquis 2.5 mg BID for [place].  Spoke with pt.  She reports she did feel a little off yesterday but denies any symptoms currently.  She is currently in rehab recovering from a fall in mid November.  Her medications are administered by Nursing staff and she does not believe there have been any changes.     Advised will notify MD, anticipate conitnued monitoring.

## 2020-10-05 DIAGNOSIS — Z79899 Other long term (current) drug therapy: Secondary | ICD-10-CM | POA: Diagnosis not present

## 2020-10-06 DIAGNOSIS — Z79899 Other long term (current) drug therapy: Secondary | ICD-10-CM | POA: Diagnosis not present

## 2020-10-06 DIAGNOSIS — D732 Chronic congestive splenomegaly: Secondary | ICD-10-CM | POA: Diagnosis not present

## 2020-10-06 DIAGNOSIS — D519 Vitamin B12 deficiency anemia, unspecified: Secondary | ICD-10-CM | POA: Diagnosis not present

## 2020-10-06 DIAGNOSIS — E118 Type 2 diabetes mellitus with unspecified complications: Secondary | ICD-10-CM | POA: Diagnosis not present

## 2020-10-07 DIAGNOSIS — M6281 Muscle weakness (generalized): Secondary | ICD-10-CM | POA: Diagnosis not present

## 2020-10-07 DIAGNOSIS — R293 Abnormal posture: Secondary | ICD-10-CM | POA: Diagnosis not present

## 2020-10-08 ENCOUNTER — Telehealth: Payer: Self-pay

## 2020-10-08 ENCOUNTER — Emergency Department (HOSPITAL_COMMUNITY): Payer: Medicare Other

## 2020-10-08 ENCOUNTER — Telehealth: Payer: Self-pay | Admitting: Interventional Cardiology

## 2020-10-08 ENCOUNTER — Inpatient Hospital Stay (HOSPITAL_COMMUNITY)
Admission: EM | Admit: 2020-10-08 | Discharge: 2020-10-13 | DRG: 291 | Disposition: A | Discharge status: Skilled Nursing Facility | Payer: Medicare Other | Source: Skilled Nursing Facility | Attending: Internal Medicine | Admitting: Internal Medicine

## 2020-10-08 DIAGNOSIS — Z794 Long term (current) use of insulin: Secondary | ICD-10-CM

## 2020-10-08 DIAGNOSIS — R0602 Shortness of breath: Secondary | ICD-10-CM | POA: Diagnosis not present

## 2020-10-08 DIAGNOSIS — K219 Gastro-esophageal reflux disease without esophagitis: Secondary | ICD-10-CM | POA: Diagnosis not present

## 2020-10-08 DIAGNOSIS — C50311 Malignant neoplasm of lower-inner quadrant of right female breast: Secondary | ICD-10-CM | POA: Diagnosis present

## 2020-10-08 DIAGNOSIS — Z823 Family history of stroke: Secondary | ICD-10-CM

## 2020-10-08 DIAGNOSIS — D631 Anemia in chronic kidney disease: Secondary | ICD-10-CM | POA: Diagnosis not present

## 2020-10-08 DIAGNOSIS — Z170 Estrogen receptor positive status [ER+]: Secondary | ICD-10-CM

## 2020-10-08 DIAGNOSIS — I4892 Unspecified atrial flutter: Secondary | ICD-10-CM | POA: Diagnosis present

## 2020-10-08 DIAGNOSIS — Z20822 Contact with and (suspected) exposure to covid-19: Secondary | ICD-10-CM | POA: Diagnosis present

## 2020-10-08 DIAGNOSIS — Z8249 Family history of ischemic heart disease and other diseases of the circulatory system: Secondary | ICD-10-CM

## 2020-10-08 DIAGNOSIS — E785 Hyperlipidemia, unspecified: Secondary | ICD-10-CM | POA: Diagnosis present

## 2020-10-08 DIAGNOSIS — R1319 Other dysphagia: Secondary | ICD-10-CM | POA: Diagnosis not present

## 2020-10-08 DIAGNOSIS — I5043 Acute on chronic combined systolic (congestive) and diastolic (congestive) heart failure: Secondary | ICD-10-CM | POA: Diagnosis not present

## 2020-10-08 DIAGNOSIS — I69391 Dysphagia following cerebral infarction: Secondary | ICD-10-CM | POA: Diagnosis not present

## 2020-10-08 DIAGNOSIS — Z950 Presence of cardiac pacemaker: Secondary | ICD-10-CM | POA: Diagnosis present

## 2020-10-08 DIAGNOSIS — E1122 Type 2 diabetes mellitus with diabetic chronic kidney disease: Secondary | ICD-10-CM | POA: Diagnosis present

## 2020-10-08 DIAGNOSIS — I130 Hypertensive heart and chronic kidney disease with heart failure and stage 1 through stage 4 chronic kidney disease, or unspecified chronic kidney disease: Principal | ICD-10-CM | POA: Diagnosis present

## 2020-10-08 DIAGNOSIS — Z853 Personal history of malignant neoplasm of breast: Secondary | ICD-10-CM | POA: Diagnosis not present

## 2020-10-08 DIAGNOSIS — R531 Weakness: Secondary | ICD-10-CM | POA: Diagnosis not present

## 2020-10-08 DIAGNOSIS — J189 Pneumonia, unspecified organism: Secondary | ICD-10-CM

## 2020-10-08 DIAGNOSIS — I340 Nonrheumatic mitral (valve) insufficiency: Secondary | ICD-10-CM | POA: Diagnosis not present

## 2020-10-08 DIAGNOSIS — M7989 Other specified soft tissue disorders: Secondary | ICD-10-CM | POA: Diagnosis not present

## 2020-10-08 DIAGNOSIS — R6889 Other general symptoms and signs: Secondary | ICD-10-CM | POA: Diagnosis not present

## 2020-10-08 DIAGNOSIS — I248 Other forms of acute ischemic heart disease: Secondary | ICD-10-CM | POA: Diagnosis not present

## 2020-10-08 DIAGNOSIS — N1832 Chronic kidney disease, stage 3b: Secondary | ICD-10-CM | POA: Diagnosis not present

## 2020-10-08 DIAGNOSIS — Z7401 Bed confinement status: Secondary | ICD-10-CM | POA: Diagnosis not present

## 2020-10-08 DIAGNOSIS — I5023 Acute on chronic systolic (congestive) heart failure: Secondary | ICD-10-CM | POA: Diagnosis not present

## 2020-10-08 DIAGNOSIS — Z8616 Personal history of COVID-19: Secondary | ICD-10-CM

## 2020-10-08 DIAGNOSIS — Z6830 Body mass index (BMI) 30.0-30.9, adult: Secondary | ICD-10-CM | POA: Diagnosis not present

## 2020-10-08 DIAGNOSIS — Z743 Need for continuous supervision: Secondary | ICD-10-CM | POA: Diagnosis not present

## 2020-10-08 DIAGNOSIS — D696 Thrombocytopenia, unspecified: Secondary | ICD-10-CM | POA: Diagnosis present

## 2020-10-08 DIAGNOSIS — Z7901 Long term (current) use of anticoagulants: Secondary | ICD-10-CM

## 2020-10-08 DIAGNOSIS — R1310 Dysphagia, unspecified: Secondary | ICD-10-CM

## 2020-10-08 DIAGNOSIS — Z86718 Personal history of other venous thrombosis and embolism: Secondary | ICD-10-CM

## 2020-10-08 DIAGNOSIS — Z79811 Long term (current) use of aromatase inhibitors: Secondary | ICD-10-CM

## 2020-10-08 DIAGNOSIS — Z79899 Other long term (current) drug therapy: Secondary | ICD-10-CM

## 2020-10-08 DIAGNOSIS — I10 Essential (primary) hypertension: Secondary | ICD-10-CM | POA: Diagnosis present

## 2020-10-08 DIAGNOSIS — I110 Hypertensive heart disease with heart failure: Secondary | ICD-10-CM | POA: Diagnosis not present

## 2020-10-08 DIAGNOSIS — E669 Obesity, unspecified: Secondary | ICD-10-CM | POA: Diagnosis present

## 2020-10-08 DIAGNOSIS — M2550 Pain in unspecified joint: Secondary | ICD-10-CM | POA: Diagnosis not present

## 2020-10-08 DIAGNOSIS — I4891 Unspecified atrial fibrillation: Secondary | ICD-10-CM | POA: Diagnosis not present

## 2020-10-08 DIAGNOSIS — I517 Cardiomegaly: Secondary | ICD-10-CM | POA: Diagnosis not present

## 2020-10-08 DIAGNOSIS — J690 Pneumonitis due to inhalation of food and vomit: Secondary | ICD-10-CM | POA: Diagnosis not present

## 2020-10-08 DIAGNOSIS — A419 Sepsis, unspecified organism: Secondary | ICD-10-CM | POA: Diagnosis not present

## 2020-10-08 LAB — I-STAT ARTERIAL BLOOD GAS, ED
Acid-Base Excess: 8 mmol/L — ABNORMAL HIGH (ref 0.0–2.0)
Bicarbonate: 31.6 mmol/L — ABNORMAL HIGH (ref 20.0–28.0)
Calcium, Ion: 1.15 mmol/L (ref 1.15–1.40)
HCT: 31 % — ABNORMAL LOW (ref 36.0–46.0)
Hemoglobin: 10.5 g/dL — ABNORMAL LOW (ref 12.0–15.0)
O2 Saturation: 98 %
Patient temperature: 100.5
Potassium: 3.6 mmol/L (ref 3.5–5.1)
Sodium: 146 mmol/L — ABNORMAL HIGH (ref 135–145)
TCO2: 33 mmol/L — ABNORMAL HIGH (ref 22–32)
pCO2 arterial: 43 mmHg (ref 32.0–48.0)
pH, Arterial: 7.478 — ABNORMAL HIGH (ref 7.350–7.450)
pO2, Arterial: 99 mmHg (ref 83.0–108.0)

## 2020-10-08 LAB — CBC WITH DIFFERENTIAL/PLATELET
Abs Immature Granulocytes: 0.01 10*3/uL (ref 0.00–0.07)
Basophils Absolute: 0 10*3/uL (ref 0.0–0.1)
Basophils Relative: 1 %
Eosinophils Absolute: 0.2 10*3/uL (ref 0.0–0.5)
Eosinophils Relative: 5 %
HCT: 35.7 % — ABNORMAL LOW (ref 36.0–46.0)
Hemoglobin: 10.7 g/dL — ABNORMAL LOW (ref 12.0–15.0)
Immature Granulocytes: 0 %
Lymphocytes Relative: 24 %
Lymphs Abs: 1 10*3/uL (ref 0.7–4.0)
MCH: 29.6 pg (ref 26.0–34.0)
MCHC: 30 g/dL (ref 30.0–36.0)
MCV: 98.6 fL (ref 80.0–100.0)
Monocytes Absolute: 0.5 10*3/uL (ref 0.1–1.0)
Monocytes Relative: 12 %
Neutro Abs: 2.3 10*3/uL (ref 1.7–7.7)
Neutrophils Relative %: 58 %
Platelets: 113 10*3/uL — ABNORMAL LOW (ref 150–400)
RBC: 3.62 MIL/uL — ABNORMAL LOW (ref 3.87–5.11)
RDW: 14.4 % (ref 11.5–15.5)
WBC: 4 10*3/uL (ref 4.0–10.5)
nRBC: 0 % (ref 0.0–0.2)

## 2020-10-08 LAB — HEMOGLOBIN A1C
Hgb A1c MFr Bld: 7.5 % — ABNORMAL HIGH (ref 4.8–5.6)
Mean Plasma Glucose: 168.55 mg/dL

## 2020-10-08 LAB — BRAIN NATRIURETIC PEPTIDE: B Natriuretic Peptide: 2506.1 pg/mL — ABNORMAL HIGH (ref 0.0–100.0)

## 2020-10-08 LAB — HIV ANTIBODY (ROUTINE TESTING W REFLEX): HIV Screen 4th Generation wRfx: NONREACTIVE

## 2020-10-08 LAB — RESP PANEL BY RT-PCR (FLU A&B, COVID) ARPGX2
Influenza A by PCR: NEGATIVE
Influenza B by PCR: NEGATIVE
SARS Coronavirus 2 by RT PCR: NEGATIVE

## 2020-10-08 LAB — COMPREHENSIVE METABOLIC PANEL
ALT: 56 U/L — ABNORMAL HIGH (ref 0–44)
AST: 38 U/L (ref 15–41)
Albumin: 3.7 g/dL (ref 3.5–5.0)
Alkaline Phosphatase: 95 U/L (ref 38–126)
Anion gap: 15 (ref 5–15)
BUN: 19 mg/dL (ref 8–23)
CO2: 27 mmol/L (ref 22–32)
Calcium: 9.1 mg/dL (ref 8.9–10.3)
Chloride: 102 mmol/L (ref 98–111)
Creatinine, Ser: 1.49 mg/dL — ABNORMAL HIGH (ref 0.44–1.00)
GFR, Estimated: 34 mL/min — ABNORMAL LOW (ref >60)
Glucose, Bld: 177 mg/dL — ABNORMAL HIGH (ref 70–99)
Potassium: 3.7 mmol/L (ref 3.5–5.1)
Sodium: 144 mmol/L (ref 135–145)
Total Bilirubin: 0.9 mg/dL (ref 0.3–1.2)
Total Protein: 7.4 g/dL (ref 6.5–8.1)

## 2020-10-08 LAB — APTT: aPTT: 29 seconds (ref 24–36)

## 2020-10-08 LAB — PROTIME-INR
INR: 1.4 — ABNORMAL HIGH (ref 0.8–1.2)
Prothrombin Time: 16.8 seconds — ABNORMAL HIGH (ref 11.4–15.2)

## 2020-10-08 LAB — CBG MONITORING, ED: Glucose-Capillary: 126 mg/dL — ABNORMAL HIGH (ref 70–99)

## 2020-10-08 LAB — GLUCOSE, CAPILLARY: Glucose-Capillary: 209 mg/dL — ABNORMAL HIGH (ref 70–99)

## 2020-10-08 LAB — TROPONIN I (HIGH SENSITIVITY)
Troponin I (High Sensitivity): 37 ng/L — ABNORMAL HIGH (ref <18)
Troponin I (High Sensitivity): 38 ng/L — ABNORMAL HIGH (ref <18)

## 2020-10-08 LAB — PROCALCITONIN: Procalcitonin: <0.1 ng/mL

## 2020-10-08 LAB — LACTIC ACID, PLASMA
Lactic Acid, Venous: 1.4 mmol/L (ref 0.5–1.9)
Lactic Acid, Venous: 1.6 mmol/L (ref 0.5–1.9)

## 2020-10-08 MED ORDER — FAMOTIDINE 20 MG PO TABS
40.0000 mg | ORAL_TABLET | Freq: Every day | ORAL | Status: DC
  Administered 2020-10-09 – 2020-10-13 (×5): 40 mg via ORAL
  Filled 2020-10-08 (×5): qty 2

## 2020-10-08 MED ORDER — ATORVASTATIN CALCIUM 10 MG PO TABS
20.0000 mg | ORAL_TABLET | Freq: Every day | ORAL | Status: DC
  Administered 2020-10-08 – 2020-10-12 (×5): 20 mg via ORAL
  Filled 2020-10-08 (×5): qty 2

## 2020-10-08 MED ORDER — DOXYCYCLINE HYCLATE 100 MG PO TABS
100.0000 mg | ORAL_TABLET | Freq: Once | ORAL | Status: AC
  Administered 2020-10-08: 100 mg via ORAL
  Filled 2020-10-08: qty 1

## 2020-10-08 MED ORDER — ACETAMINOPHEN 325 MG PO TABS
650.0000 mg | ORAL_TABLET | ORAL | Status: DC | PRN
  Administered 2020-10-12: 650 mg via ORAL
  Filled 2020-10-08: qty 2

## 2020-10-08 MED ORDER — SODIUM CHLORIDE 0.9% FLUSH
3.0000 mL | Freq: Two times a day (BID) | INTRAVENOUS | Status: DC
  Administered 2020-10-08 – 2020-10-13 (×9): 3 mL via INTRAVENOUS

## 2020-10-08 MED ORDER — AMIODARONE HCL 200 MG PO TABS
200.0000 mg | ORAL_TABLET | ORAL | Status: DC
  Administered 2020-10-09 – 2020-10-13 (×4): 200 mg via ORAL
  Filled 2020-10-08 (×4): qty 1

## 2020-10-08 MED ORDER — SODIUM CHLORIDE 0.9% FLUSH: 3.0000 mL | INTRAVENOUS | Status: DC | PRN

## 2020-10-08 MED ORDER — INSULIN GLARGINE 100 UNIT/ML ~~LOC~~ SOLN
15.0000 [IU] | Freq: Every day | SUBCUTANEOUS | Status: DC
  Administered 2020-10-08 – 2020-10-12 (×5): 15 [IU] via SUBCUTANEOUS
  Filled 2020-10-08 (×7): qty 0.15

## 2020-10-08 MED ORDER — FUROSEMIDE 10 MG/ML IJ SOLN
40.0000 mg | Freq: Once | INTRAMUSCULAR | Status: AC
  Administered 2020-10-08: 40 mg via INTRAVENOUS
  Filled 2020-10-08: qty 4

## 2020-10-08 MED ORDER — SODIUM CHLORIDE 0.9 % IV SOLN
1.0000 g | Freq: Once | INTRAVENOUS | Status: AC
  Administered 2020-10-08: 1 g via INTRAVENOUS
  Filled 2020-10-08: qty 10

## 2020-10-08 MED ORDER — DICLOFENAC SODIUM 1 % EX GEL
1.0000 "application " | Freq: Four times a day (QID) | CUTANEOUS | Status: DC
  Administered 2020-10-08 – 2020-10-13 (×15): 1 via TOPICAL
  Filled 2020-10-08: qty 100

## 2020-10-08 MED ORDER — GUAIFENESIN ER 600 MG PO TB12
600.0000 mg | ORAL_TABLET | Freq: Two times a day (BID) | ORAL | Status: DC
  Administered 2020-10-08 – 2020-10-13 (×10): 600 mg via ORAL
  Filled 2020-10-08 (×10): qty 1

## 2020-10-08 MED ORDER — PREGABALIN 75 MG PO CAPS
75.0000 mg | ORAL_CAPSULE | Freq: Every day | ORAL | Status: DC
  Administered 2020-10-08 – 2020-10-12 (×5): 75 mg via ORAL
  Filled 2020-10-08 (×5): qty 1

## 2020-10-08 MED ORDER — ONDANSETRON HCL 4 MG/2ML IJ SOLN: 4.0000 mg | Freq: Four times a day (QID) | INTRAMUSCULAR | Status: DC | PRN

## 2020-10-08 MED ORDER — SODIUM CHLORIDE 0.9 % IV SOLN: 250.0000 mL | INTRAVENOUS | Status: DC | PRN

## 2020-10-08 MED ORDER — APIXABAN 2.5 MG PO TABS
2.5000 mg | ORAL_TABLET | Freq: Two times a day (BID) | ORAL | Status: DC
  Administered 2020-10-08 – 2020-10-13 (×10): 2.5 mg via ORAL
  Filled 2020-10-08 (×11): qty 1

## 2020-10-08 MED ORDER — MIRTAZAPINE 15 MG PO TABS
7.5000 mg | ORAL_TABLET | Freq: Every day | ORAL | Status: DC
  Administered 2020-10-08 – 2020-10-12 (×5): 7.5 mg via ORAL
  Filled 2020-10-08 (×6): qty 1

## 2020-10-08 MED ORDER — OXYBUTYNIN CHLORIDE 5 MG PO TABS
5.0000 mg | ORAL_TABLET | Freq: Two times a day (BID) | ORAL | Status: DC
  Administered 2020-10-08 – 2020-10-13 (×10): 5 mg via ORAL
  Filled 2020-10-08 (×10): qty 1

## 2020-10-08 MED ORDER — CEFTRIAXONE SODIUM 2 G IJ SOLR
2.0000 g | INTRAMUSCULAR | Status: DC
  Administered 2020-10-09: 2 g via INTRAVENOUS
  Filled 2020-10-08: qty 20

## 2020-10-08 MED ORDER — DOCUSATE SODIUM 100 MG PO CAPS
100.0000 mg | ORAL_CAPSULE | Freq: Every day | ORAL | Status: DC
  Administered 2020-10-09 – 2020-10-13 (×3): 100 mg via ORAL
  Filled 2020-10-08 (×5): qty 1

## 2020-10-08 MED ORDER — METOPROLOL SUCCINATE ER 25 MG PO TB24
25.0000 mg | ORAL_TABLET | Freq: Every day | ORAL | Status: DC
  Administered 2020-10-09 – 2020-10-13 (×5): 25 mg via ORAL
  Filled 2020-10-08 (×5): qty 1

## 2020-10-08 MED ORDER — INSULIN ASPART 100 UNIT/ML ~~LOC~~ SOLN
0.0000 [IU] | Freq: Three times a day (TID) | SUBCUTANEOUS | Status: DC
  Administered 2020-10-09 (×2): 2 [IU] via SUBCUTANEOUS
  Administered 2020-10-10: 1 [IU] via SUBCUTANEOUS
  Administered 2020-10-10 – 2020-10-12 (×4): 2 [IU] via SUBCUTANEOUS
  Administered 2020-10-12: 3 [IU] via SUBCUTANEOUS
  Administered 2020-10-13: 1 [IU] via SUBCUTANEOUS

## 2020-10-08 MED ORDER — DOXYCYCLINE HYCLATE 100 MG PO TABS
100.0000 mg | ORAL_TABLET | Freq: Two times a day (BID) | ORAL | Status: DC
  Administered 2020-10-08 – 2020-10-12 (×9): 100 mg via ORAL
  Filled 2020-10-08 (×9): qty 1

## 2020-10-08 MED ORDER — ACETAMINOPHEN 500 MG PO TABS
1000.0000 mg | ORAL_TABLET | Freq: Once | ORAL | Status: AC
  Administered 2020-10-08: 1000 mg via ORAL
  Filled 2020-10-08: qty 2

## 2020-10-08 NOTE — Telephone Encounter (Signed)
Daughter Angelena Form [facility]) called, states patient has experienced shortness of breath/leg swelling that started this Monday 10/05/20. States patient was recently diagnosed with pneumonia and has started on anti-biotics. States patient has declined and experienced vomiting. Advised I would call facility to speak with staff.  [facility] [place] [phone]. Room 211. Spoke to [place], states patient is now on the way to the hospital for treatment.

## 2020-10-08 NOTE — ED Notes (Signed)
Pt is upset and refusing insulin stating that she wants to eat first. This RN offered pt a [place] sandwich until meal trays arrive however, pt refused that as well.

## 2020-10-08 NOTE — ED Triage Notes (Signed)
Pt here via ems from [facility] with reports of increased shob X3 days. Pt dx with PNA last week, finished course of abx. Pt VSS.

## 2020-10-08 NOTE — ED Notes (Signed)
CBG 126  

## 2020-10-08 NOTE — ED Provider Notes (Signed)
[facility] EMERGENCY DEPARTMENT Provider Note   CSN: [id number] Arrival date & time: 10/08/20  1059     History Chief Complaint  Patient presents with  . Shortness of Breath    [name] is a 86 y.o. female.  [name] is a 86 y.o. female with a history of CHF, HTN, SVT, CKD, DVT, diabetes, mitral regurg, who presents to the ED via EMS from [facility] for evaluation of worsening shortness of breath over the past 3 days.  Patient was diagnosed with pneumonia last week and per nursing facility records was treated with a course of azithromycin, she reports despite this she does not feel like she has been improving and over the past 3 days she has had significantly worsening shortness of breath.  She reports persistent cough with clear sputum.  She has had some chest soreness primarily after coughing.  Also reports some worsening lower extremity swelling.  Denies pain in her legs.  No abdominal pain, did have some posttussive emesis but otherwise no persistent vomiting.  No diarrhea.  No known sick contacts.  Patient unsure if she has been vaccinated for Covid but did have Covid back in September, has had negative Covid test since.  With EMS patient placed on 3 L nasal cannula for increased work of breathing.  Patient has been at rehab facility for the past 3 weeks to work on increased strength and mobility.        Past Medical History:  Diagnosis Date  . Abnormal echocardiogram    a. possible mass on echo 05/2019 on PPM, b. no evidence of mass / atrial lead vegetation on follow up echo 06/2019  . Anemia   . Arthritis   . Breast cancer ([place]) 10/2018   Invasive ductal carcinoma  . Cancer ([place])   . Cataract   . Chronic combined systolic and diastolic CHF (congestive heart failure) ([place])    a. Previously diastolic but patient AVS from outside hospital listed systolic CHF and cardiomyopathy, records pending.  . CKD (chronic kidney disease), stage  III ([place])   . Clotting disorder ([place])   . CVA (cerebral vascular accident) ([place])    residual right sided weakness and mild dysphagia  . Diabetes mellitus ([place])   . Dizziness and giddiness   . DVT (deep venous thrombosis) (HCC)    a. on anticoagulation for this.  . Essential hypertension   . LBBB (left bundle branch block)   . Mitral regurgitation    a. Mod MR by echo 2014.  . Muscular deconditioning   . Obesity   . Pacemaker   . Pericardial effusion   . SVT (supraventricular tachycardia) ([place])    a. In 2013 she had an EPS with ablation for SVT which did not eliminate the SVT completely. She also had bradycardia which limited medication. She was placed on amiodarone by Dr. Lovena Le.  . Thrombocytopenia ([place]) 11/21/2011    Patient Active Problem List   Diagnosis Date Noted  . Declining functional status   . Goals of care, counseling/discussion   . Palliative care by specialist   . Aspiration pneumonia ([place]) 07/13/2020  . Hypoglycemia 07/13/2020  . Elevated troponin 07/13/2020  . Sepsis ([place]) 07/13/2020  . Syncope and collapse 06/22/2020  . COVID-19 virus infection 06/22/2020  . Hypokalemia 06/22/2020  . Pacemaker 05/06/2020  . Syncope 08/10/2019  . Displaced fracture of lateral malleolus of right fibula, initial encounter for closed fracture 07/10/2019  . Pain in left shoulder  07/10/2019  . Acute CHF (congestive heart failure) ([place]) 06/09/2019  . Acute pulmonary edema (HCC)   . Personal history of breast cancer 04/22/2019  . Pressure ulcer 01/04/2019  . Acute pancreatitis 01/02/2019  . Malignant neoplasm of lower-inner quadrant of right breast of female, estrogen receptor positive ([place]) 11/23/2018  . Hematoma of right iliopsoas muscle 11/06/2018  . Intramuscular hematoma 11/06/2018  . Hypertensive heart disease with heart failure ([place]) 09/05/2017  . Aphasia 08/31/2017  . Dysphagia 08/31/2017  . CKD (chronic kidney disease), stage III ([place])   . LBBB (left bundle branch  block)   . Anemia   . HTN (hypertension)   . Obesity   . DVT (deep venous thrombosis) ([place])   . DM (diabetes mellitus) ([place]) 07/28/2015  . Fall 07/28/2015  . Warfarin-induced coagulopathy ([place]) 07/28/2015  . Acute on chronic diastolic CHF (congestive heart failure) ([place]) 03/19/2014  . Edema 08/06/2013  . SVT (supraventricular tachycardia) ([place]) 10/15/2012  . First degree AV block 10/15/2012  . Acute renal insufficiency 10/14/2012  . Dizziness and giddiness   . Thrombocytopenia ([place]) 11/21/2011    Past Surgical History:  Procedure Laterality Date  . EYE SURGERY    . SUPRAVENTRICULAR TACHYCARDIA ABLATION N/A 10/11/2012   Procedure: SUPRAVENTRICULAR TACHYCARDIA ABLATION;  Surgeon: Evans Lance, MD;  Location[facility] C[facility] LAB;  Service: Cardiovascular;  Laterality: N/A;     OB History   No obstetric history on file.     Family History  Problem Relation Age of Onset  . Stroke Mother   . Cancer Father        prostate  . Hypertension Daughter   . Heart attack Brother        x2  . Hypertension Brother   . Stroke Sister   . Hypertension Sister   . Breast cancer Maternal Aunt     Social History   Tobacco Use  . Smoking status: Never Smoker  . Smokeless tobacco: Never Used  Vaping Use  . Vaping Use: Never used  Substance Use Topics  . Alcohol use: No  . Drug use: No    Home Medications Prior to Admission medications   Medication Sig Start Date End Date Taking? Authorizing Provider  acetaminophen (TYLENOL) 500 MG tablet Take 1,000 mg by mouth every 6 (six) hours as needed for mild pain, moderate pain or headache.     [provider]  albuterol (VENTOLIN HFA) 108 (90 Base) MCG/ACT inhaler Inhale 2 puffs into the lungs every 4 (four) hours as needed for wheezing or shortness of breath. 07/13/20   Charlesetta Shanks, MD  amiodarone (PACERONE) 200 MG tablet Take one tablet by mouth daily Monday-Saturday. Do NOT take Sunday 05/06/20   Evans Lance, MD  anastrozole  (ARIMIDEX) 1 MG tablet TAKE 1 TABLET(1 MG) BY MOUTH DAILY 01/15/20   Nicholas Lose, MD  atorvastatin (LIPITOR) 20 MG tablet TAKE 1 TABLET(20 MG) BY MOUTH AT BEDTIME 08/18/20   Minette Brine, FNP  benzonatate (TESSALON) 100 MG capsule Take 1 capsule (100 mg total) by mouth every 8 (eight) hours. Swallow capsule whole.  Do not chew 07/13/20   Charlesetta Shanks, MD  cholecalciferol (VITAMIN D3) 25 MCG (1000 UNIT) tablet Take 1,000 Units by mouth daily.    [provider]  diclofenac sodium (VOLTAREN) 1 % GEL Apply 2 g topically 4 (four) times daily. 10/30/18   Rodriguez-Southworth, Sunday Spillers, PA-C  ELIQUIS 2.5 MG TABS tablet TAKE 1 TABLET(2.5 MG) BY MOUTH TWICE DAILY 07/13/20   Charlie Pitter,  PA-C  famotidine (PEPCID) 40 MG tablet Take 40 mg by mouth 2 (two) times daily. 05/22/20   [provider]  ferrous sulfate 325 (65 FE) MG tablet Take 325 mg by mouth daily with breakfast.    [provider]  HUMALOG KWIKPEN 200 UNIT/ML KwikPen INJECT 3 UNITS UNDER THE SKIN BEFORE BREAKFAST, LUNCH, DINNER IF SUGARS ARE GREATER THAN 175. 02/11/20   [name], MD  insulin detemir (LEVEMIR) 100 UNIT/ML injection Inject 0.15 mLs (15 Units total) into the skin at bedtime. 07/20/20   Aline August, MD  ipratropium (ATROVENT HFA) 17 MCG/ACT inhaler Inhale 2 puffs into the lungs every 6 (six) hours as needed for wheezing. 07/13/20   Charlesetta Shanks, MD  metoprolol succinate (TOPROL-XL) 25 MG 24 hr tablet Take 1 tablet (25 mg total) by mouth daily. 07/21/20   Aline August, MD  mirtazapine (REMERON) 7.5 MG tablet TAKE 1 TABLET(7.5 MG) BY MOUTH AT BEDTIME 04/14/20   [name], MD  MYRBETRIQ 50 MG TB24 tablet Take 50 mg by mouth daily. 05/24/20   [provider]  omeprazole (PRILOSEC) 20 MG capsule Take 20 mg by mouth daily.    [provider]  oxybutynin (DITROPAN) 5 MG tablet TAKE 1 TABLET(5 MG) BY MOUTH TWICE DAILY 12/23/19   [name], MD  pregabalin (LYRICA) 75 MG capsule Take 1  capsule (75 mg total) by mouth at bedtime. 06/30/20   Minette Brine, FNP  Semaglutide,0.25 or 0.5MG /DOS, (OZEMPIC, 0.25 OR 0.5 MG/DOSE,) 2 MG/1.5ML SOPN Inject 0.25 mg into the skin once a week. Take on Wednesday    [provider]  torsemide (DEMADEX) 20 MG tablet Take 1 tablet (20 mg total) by mouth daily. You may take an extra 20 mg tablet daily as needed for swelling 07/20/20   Aline August, MD  vitamin B-12 (CYANOCOBALAMIN) 1000 MCG tablet Take 1,000 mcg by mouth daily.    [provider]    Allergies    Aspirin and Penicillins  Review of Systems   Review of Systems  Constitutional: Negative for chills and fever.  HENT: Negative.   Respiratory: Positive for cough and shortness of breath.   Cardiovascular: Positive for chest pain and leg swelling.  Gastrointestinal: Negative for abdominal pain, diarrhea, nausea and vomiting.  Genitourinary: Negative for dysuria and frequency.  Musculoskeletal: Negative for arthralgias and myalgias.  Skin: Negative for color change and rash.  Neurological: Negative for dizziness, syncope and light-headedness.  All other systems reviewed and are negative.   Physical Exam Updated Vital Signs BP (!) 158/109   Pulse 72   Temp 99.2 F (37.3 C) (Oral)   Resp (!) 23   SpO2 97%   Physical Exam Vitals and nursing note reviewed.  Constitutional:      General: She is not in acute distress.    Appearance: She is well-developed and well-nourished. She is obese. She is ill-appearing. She is not diaphoretic.     Comments: Ill-appearing with increased work of breathing, but alert and mentating well  HENT:     Head: Normocephalic and atraumatic.     Mouth/Throat:     Mouth: Oropharynx is clear and moist.  Eyes:     General:        Right eye: No discharge.        Left eye: No discharge.     Extraocular Movements: EOM normal.     Pupils: Pupils are equal, round, and reactive to light.  Cardiovascular:     Rate and Rhythm: Normal  rate  and regular rhythm.     Pulses: Intact distal pulses.     Heart sounds: Normal heart sounds.  Pulmonary:     Effort: Pulmonary effort is normal. Tachypnea present. No respiratory distress.     Breath sounds: Decreased breath sounds and rales present. No wheezing.     Comments: Patient is tachypneic with increased respiratory effort, satting at 97% on room air, on auscultation she has rales throughout bilateral lower lung fields with some more faint rales in the upper interspaces as well. Chest:     Chest wall: No tenderness.  Abdominal:     General: Bowel sounds are normal. There is no distension.     Palpations: Abdomen is soft. There is no mass.     Tenderness: There is no abdominal tenderness. There is no guarding.  Musculoskeletal:        General: No deformity.     Cervical back: Neck supple.     Right lower leg: Edema present.     Left lower leg: Edema present.     Comments: Pitting edema of bilateral lower extremities up to the upper shin, 2+ DP pulses bilaterally, no erythema or signs of cellulitis  Skin:    General: Skin is warm and dry.     Capillary Refill: Capillary refill takes less than 2 seconds.  Neurological:     Mental Status: She is alert.     Coordination: Coordination normal.     Comments: Speech is clear, able to follow commands Moves extremities without ataxia, coordination intact  Psychiatric:        Mood and Affect: Mood normal.        Behavior: Behavior normal.     ED Results / Procedures / Treatments   Labs (all labs ordered are listed, but only abnormal results are displayed) Labs Reviewed  COMPREHENSIVE METABOLIC PANEL - Abnormal; Notable for the following components:      Result Value   Glucose, Bld 177 (*)    Creatinine, Ser 1.49 (*)    ALT 56 (*)    GFR, Estimated 34 (*)    All other components within normal limits  CBC WITH DIFFERENTIAL/PLATELET - Abnormal; Notable for the following components:   RBC 3.62 (*)    Hemoglobin 10.7 (*)    HCT  35.7 (*)    Platelets 113 (*)    All other components within normal limits  PROTIME-INR - Abnormal; Notable for the following components:   Prothrombin Time 16.8 (*)    INR 1.4 (*)    All other components within normal limits  I-STAT ARTERIAL BLOOD GAS, ED - Abnormal; Notable for the following components:   pH, Arterial 7.478 (*)    Bicarbonate 31.6 (*)    TCO2 33 (*)    Acid-Base Excess 8.0 (*)    Sodium 146 (*)    HCT 31.0 (*)    Hemoglobin 10.5 (*)    All other components within normal limits  TROPONIN I (HIGH SENSITIVITY) - Abnormal; Notable for the following components:   Troponin I (High Sensitivity) 37 (*)    All other components within normal limits  RESP PANEL BY RT-PCR (FLU A&B, COVID) ARPGX2  CULTURE, BLOOD (SINGLE)  URINE CULTURE  LACTIC ACID, PLASMA  LACTIC ACID, PLASMA  APTT  URINALYSIS, ROUTINE W REFLEX MICROSCOPIC  BRAIN NATRIURETIC PEPTIDE  HIV ANTIBODY (ROUTINE TESTING W REFLEX)  PROCALCITONIN  TROPONIN I (HIGH SENSITIVITY)    EKG EKG Interpretation  Date/Time:  Thursday October 08 2020 11:08:56  EST Ventricular Rate:  61 PR Interval:    QRS Duration: 172 QT Interval:  517 QTC Calculation: 517 R Axis:   -48 Text Interpretation: Afib/flut and V-paced complexes No further analysis attempted due to paced rhythm Confirmed by Sherwood Gambler [phone]) on 10/08/2020 11:15:16 AM   Radiology DG Chest Port 1 View  Result Date: 10/08/2020 CLINICAL DATA:  Shortness of breath, leg swelling since Monday, questionable sepsis, pneumonia on antibiotics EXAM: PORTABLE CHEST 1 VIEW COMPARISON:  Portable exam 1125 hours compared to 06/20/2020 FINDINGS: LEFT subclavian sequential transvenous pacemaker leads project at RIGHT atrium and RIGHT ventricle. Enlargement of cardiac silhouette. Mediastinal contours and pulmonary vascularity normal. Bronchitic changes with bibasilar interstitial infiltrates new since previous exam. Upper lungs clear. No definite pleural effusion or  pneumothorax. IMPRESSION: New interstitial infiltrates in the mid to lower lungs bilaterally which could represent edema or infection. Enlargement of cardiac silhouette post pacemaker. Electronically Signed   By: Lavonia Dana M.D.   On: 10/08/2020 11:47    Procedures Procedures (including critical care time)  Medications Ordered in ED Medications  sodium chloride flush (NS) 0.9 % injection 3 mL (3 mLs Intravenous Given 10/08/20 1453)  sodium chloride flush (NS) 0.9 % injection 3 mL (has no administration in time range)  0.9 %  sodium chloride infusion (has no administration in time range)  acetaminophen (TYLENOL) tablet 650 mg (has no administration in time range)  ondansetron (ZOFRAN) injection 4 mg (has no administration in time range)  cefTRIAXone (ROCEPHIN) 2 g in sodium chloride 0.9 % 100 mL IVPB (has no administration in time range)  doxycycline (VIBRA-TABS) tablet 100 mg (has no administration in time range)  cefTRIAXone (ROCEPHIN) 1 g in sodium chloride 0.9 % 100 mL IVPB (0 g Intravenous Stopped 10/08/20 1432)  doxycycline (VIBRA-TABS) tablet 100 mg (100 mg Oral Given 10/08/20 1307)  furosemide (LASIX) injection 40 mg (40 mg Intravenous Given 10/08/20 1452)  acetaminophen (TYLENOL) tablet 1,000 mg (1,000 mg Oral Given 10/08/20 1452)    ED Course  I have reviewed the triage vital signs and the nursing notes.  Pertinent labs & imaging results that were available during my care of the patient were reviewed by me and considered in my medical decision making (see chart for details).    MDM Rules/Calculators/A&P                         86 year old female presents from skilled nursing facility with worsening shortness of breath after being diagnosed with pneumonia last week and treated with a course of azithromycin, on arrival she is tachypneic with increased respiratory effort but satting at 97% on room air, 2 L nasal cannula applied with improvement in work of breathing, she has rales  bilaterally and also has worsening lower extremity swelling, no wheezing or rhonchi noted on exam.  She does have a slight temp of 100.5, but aside from tachypnea the rest of her vitals are reassuring, no tachycardia or hypotension.  Will initiate infectious work-up but will hold off on calling code sepsis given reassuring vitals.  I suspect that patient is also having an exacerbation of her CHF contributing to symptoms. Pt is on eliquis, low suspicion for PE  I have independently ordered, reviewed and interpreted all labs and imaging: CBC: No leukocytosis, hemoglobin of 10.7 CMP: Creatinine of 1.49 which is just slightly above baseline, glucose of 177, no other significant electrolyte derangements, ALT of 56 but otherwise normal LFTs Lactic acid: Not elevated Troponin:  mildly elevated at 37, suspect this may be in the setting of stress from infection versus CHF, will check delta BNP: significantly elevated at 2506.1  EKG with A. Fib/flutter, paced rhythm  Chest x-ray with new interstitial infiltrates in the mid to lower lungs bilaterally which could represent edema or infection, enlargement of the cardiac silhouette  Patient started on IV Rocephin and doxycycline, as well as IV Lasix, will consult for medicine admission  Case discussed with Dr. Fuller Plan with [facility] hospitalist who will see and admit the patient  Final Clinical Impression(s) / ED Diagnoses Final diagnoses:  Acute on chronic combined systolic and diastolic CHF (congestive heart failure) ([place])  Community acquired pneumonia, unspecified laterality    Rx / DC Orders ED Discharge Orders    None       Jacqlyn Larsen, [place] 10/08/20 1617    Sherwood Gambler, MD 10/09/20 [phone]

## 2020-10-08 NOTE — Telephone Encounter (Signed)
Device picked up

## 2020-10-08 NOTE — H&P (Addendum)
History and Physical    [name]:[id number] DOB: [birth year] DOA: 10/08/2020  Referring MD/NP/PA: Benedetto Goad, PA-C PCP: [name], MD  [facility]  Chief Complaint: Shortness of breath  I have personally briefly reviewed patient's old medical records in [place]   HPI: [name] is a 86 y.o. female with medical history significant of combined systolic and diastolic congestive heart, hypertension, chronic kidney disease stage III, history of CVA, dysphagia, recurrent DVT on Eliquis, left bundle branch block, diabetes, SVT s/p ablation with pacemaker and COVID-19 infection in 05/2020.  She presents with complaints of worsening shortness of breath over the last 3 days.  Patient had notes that she has had a intermittently productive cough.  Associated symptoms included chest discomfort on the left side, leg swelling, unknown amount weight gain, and subjective fevers.  Diagnosed with a pneumonia last week and had completed a course of azithromycin.  Patient reports that she feels like food intermittently gets stuck pain causes her to vomit.  Records note that during previous hospitalization patient was evaluated by speech in September and recommended to be on a dysphagia 3 mechanically soft diet and thin liquids.  Patient's daughter notes that recently she has been having a lot of nausea and vomiting and is unsure if the patient was always on a mechanically soft diet.  Her daughter notes that she had gone into atrial fibrillation overnight.  ED Course: Upon admission into the emergency department patient was noted to be febrile up to 100.5 F, respirations 19-25, and all other vital signs maintained.  Patient was placed on 2 L of nasal cannula oxygen due to work of breathing and tachypnea, but was never noted to be hypoxic.  Labs significant for WBC 4, hemoglobin 10.7, platelets 113, troponin 37, and lactic acid 1.4.  ABG for pH 7.478, PCO2 43,  and PO2 99. Chest x-ray showed new interstitial infiltrates in both mid to lower lung fields.  Influenza and COVID-19 screening were both negative. Patient had been given 40 mg of Lasix IV, acetaminophen 1000 mg for fever, Rocephin, and doxycycline.  Review of Systems  Constitutional: Positive for fever and malaise/fatigue.  HENT: Negative for congestion and sinus pain.   Eyes: Negative for photophobia and pain.  Respiratory: Positive for cough, sputum production and shortness of breath.   Cardiovascular: Positive for chest pain and leg swelling.  Gastrointestinal: Positive for vomiting. Negative for abdominal pain and nausea.  Genitourinary: Negative for dysuria and frequency.  Musculoskeletal: Negative for falls.  Skin: Negative for itching.  Neurological: Positive for weakness. Negative for focal weakness and loss of consciousness.  Endo/Heme/Allergies: Negative for polydipsia.  Psychiatric/Behavioral: Negative for memory loss and substance abuse.    Past Medical History:  Diagnosis Date  . Abnormal echocardiogram    a. possible mass on echo 05/2019 on PPM, b. no evidence of mass / atrial lead vegetation on follow up echo 06/2019  . Anemia   . Arthritis   . Breast cancer ([place]) 10/2018   Invasive ductal carcinoma  . Cancer ([place])   . Cataract   . Chronic combined systolic and diastolic CHF (congestive heart failure) ([place])    a. Previously diastolic but patient AVS from outside hospital listed systolic CHF and cardiomyopathy, records pending.  . CKD (chronic kidney disease), stage III ([place])   . Clotting disorder ([place])   . CVA (cerebral vascular accident) ([place])    residual right sided weakness and mild dysphagia  .  Diabetes mellitus ([place])   . Dizziness and giddiness   . DVT (deep venous thrombosis) ([facility])    a. on anticoagulation for this.  . Essential hypertension   . LBBB (left bundle branch block)   . Mitral regurgitation    a. Mod MR by echo 2014.  . Muscular deconditioning    . Obesity   . Pacemaker   . Pericardial effusion   . SVT (supraventricular tachycardia) ([place])    a. In 2013 she had an EPS with ablation for SVT which did not eliminate the SVT completely. She also had bradycardia which limited medication. She was placed on amiodarone by Dr. Lovena Le.  . Thrombocytopenia ([place]) 11/21/2011    Past Surgical History:  Procedure Laterality Date  . EYE SURGERY    . SUPRAVENTRICULAR TACHYCARDIA ABLATION N/A 10/11/2012   Procedure: SUPRAVENTRICULAR TACHYCARDIA ABLATION;  Surgeon: Evans Lance, MD;  Location: [facility] CATH LAB;  Service: Cardiovascular;  Laterality: N/A;     reports that she has never smoked. She has never used smokeless tobacco. She reports that she does not drink alcohol and does not use drugs.  Allergies  Allergen Reactions  . Aspirin Itching  . Penicillins Itching and Rash    DID THE REACTION INVOLVE: Swelling of the face/tongue/throat, SOB, or low BP? Yes Sudden or severe rash/hives, skin peeling, or the inside of the mouth or nose? No Did it require medical treatment? Yes When did it last happen?"More than 10 years ago" If all above answers are "NO", may proceed with cephalosporin use.     Family History  Problem Relation Age of Onset  . Stroke Mother   . Cancer Father        prostate  . Hypertension Daughter   . Heart attack Brother        x2  . Hypertension Brother   . Stroke Sister   . Hypertension Sister   . Breast cancer Maternal Aunt     Prior to Admission medications   Medication Sig Start Date End Date Taking? Authorizing Provider  acetaminophen (TYLENOL) 500 MG tablet Take 1,000 mg by mouth every 6 (six) hours as needed for mild pain, moderate pain or headache.     [provider]  albuterol (VENTOLIN HFA) 108 (90 Base) MCG/ACT inhaler Inhale 2 puffs into the lungs every 4 (four) hours as needed for wheezing or shortness of breath. 07/13/20   Charlesetta Shanks, MD  amiodarone (PACERONE) 200 MG tablet Take one  tablet by mouth daily Monday-Saturday. Do NOT take Sunday 05/06/20   Evans Lance, MD  anastrozole (ARIMIDEX) 1 MG tablet TAKE 1 TABLET(1 MG) BY MOUTH DAILY 01/15/20   Nicholas Lose, MD  atorvastatin (LIPITOR) 20 MG tablet TAKE 1 TABLET(20 MG) BY MOUTH AT BEDTIME 08/18/20   Minette Brine, FNP  benzonatate (TESSALON) 100 MG capsule Take 1 capsule (100 mg total) by mouth every 8 (eight) hours. Swallow capsule whole.  Do not chew 07/13/20   Charlesetta Shanks, MD  cholecalciferol (VITAMIN D3) 25 MCG (1000 UNIT) tablet Take 1,000 Units by mouth daily.    [provider]  diclofenac sodium (VOLTAREN) 1 % GEL Apply 2 g topically 4 (four) times daily. 10/30/18   Rodriguez-Southworth, Sunday Spillers, PA-C  ELIQUIS 2.5 MG TABS tablet TAKE 1 TABLET(2.5 MG) BY MOUTH TWICE DAILY 07/13/20   Dunn, Nedra Hai, PA-C  famotidine (PEPCID) 40 MG tablet Take 40 mg by mouth 2 (two) times daily. 05/22/20   [provider]  ferrous sulfate 325 (65 FE)  MG tablet Take 325 mg by mouth daily with breakfast.    [provider]  HUMALOG KWIKPEN 200 UNIT/ML KwikPen INJECT 3 UNITS UNDER THE SKIN BEFORE BREAKFAST, LUNCH, DINNER IF SUGARS ARE GREATER THAN 175. 02/11/20   [name], MD  insulin detemir (LEVEMIR) 100 UNIT/ML injection Inject 0.15 mLs (15 Units total) into the skin at bedtime. 07/20/20   Aline August, MD  ipratropium (ATROVENT HFA) 17 MCG/ACT inhaler Inhale 2 puffs into the lungs every 6 (six) hours as needed for wheezing. 07/13/20   Charlesetta Shanks, MD  metoprolol succinate (TOPROL-XL) 25 MG 24 hr tablet Take 1 tablet (25 mg total) by mouth daily. 07/21/20   Aline August, MD  mirtazapine (REMERON) 7.5 MG tablet TAKE 1 TABLET(7.5 MG) BY MOUTH AT BEDTIME 04/14/20   [name], MD  MYRBETRIQ 50 MG TB24 tablet Take 50 mg by mouth daily. 05/24/20   [provider]  omeprazole (PRILOSEC) 20 MG capsule Take 20 mg by mouth daily.    [provider]  oxybutynin (DITROPAN) 5 MG tablet TAKE 1  TABLET(5 MG) BY MOUTH TWICE DAILY 12/23/19   [name], MD  pregabalin (LYRICA) 75 MG capsule Take 1 capsule (75 mg total) by mouth at bedtime. 06/30/20   Minette Brine, FNP  Semaglutide,0.25 or 0.5MG /DOS, (OZEMPIC, 0.25 OR 0.5 MG/DOSE,) 2 MG/1.5ML SOPN Inject 0.25 mg into the skin once a week. Take on Wednesday    [provider]  torsemide (DEMADEX) 20 MG tablet Take 1 tablet (20 mg total) by mouth daily. You may take an extra 20 mg tablet daily as needed for swelling 07/20/20   Aline August, MD  vitamin B-12 (CYANOCOBALAMIN) 1000 MCG tablet Take 1,000 mcg by mouth daily.    [provider]    Physical Exam:  Constitutional: Elderly female who appears to be ill in respiratory distress Vitals:   10/08/20 1145 10/08/20 1156 10/08/20 1156 10/08/20 1245  BP: (!) 151/79   (!) 161/97  Pulse: 61   61  Resp: (!) 25   (!) 25  Temp:  (!) 100.5 F (38.1 C)    TempSrc:  Rectal    SpO2: 99%   100%  Weight:   84 kg   Height:   5\' 5"  (1.651 m)    Eyes: PERRL, lids and conjunctivae normal ENMT: Mucous membranes are moist. Posterior pharynx clear of any exudate or lesions. Neck: normal, supple, no masses, no thyromegaly.  Positive JVD Respiratory: Tachypneic with positive crackles appreciated in the mid to lower lung fields bilaterally.  Currently on 2 L of nasal cannula oxygen still talking in shortened sentences. Cardiovascular: Regular rate and rhythm, no murmurs / rubs / gallops.  At least +1 pitting bilateral lower extremity edema. 2+ pedal pulses. No carotid bruits.  Abdomen: no tenderness, no masses palpated. No hepatosplenomegaly. Bowel sounds positive.  Musculoskeletal: no clubbing / cyanosis. No joint deformity upper and lower extremities. Good ROM, no contractures. Normal muscle tone.  Skin: no rashes, lesions, ulcers. No induration Neurologic: CN 2-12 grossly intact. Sensation intact, DTR normal. Strength 5/5 in all 4.  Psychiatric: Normal judgment and insight. Alert  and oriented x 3. Normal mood.     Labs on Admission: I have personally reviewed following labs and imaging studies  CBC: Recent Labs  Lab 10/08/20 1202 10/08/20 1204  WBC  --  4.0  NEUTROABS  --  2.3  HGB 10.5* 10.7*  HCT 31.0* 35.7*  MCV  --  98.6  PLT  --  113*  Basic Metabolic Panel: Recent Labs  Lab 10/08/20 1202 10/08/20 1204  NA 146* 144  K 3.6 3.7  CL  --  102  CO2  --  27  GLUCOSE  --  177*  BUN  --  19  CREATININE  --  1.49*  CALCIUM  --  9.1   GFR: Estimated Creatinine Clearance: 29.5 mL/min (A) (by C-G formula based on SCr of 1.49 mg/dL (H)). Liver Function Tests: Recent Labs  Lab 10/08/20 1204  AST 38  ALT 56*  ALKPHOS 95  BILITOT 0.9  PROT 7.4  ALBUMIN 3.7   No results for input(s): LIPASE, AMYLASE in the last 168 hours. No results for input(s): AMMONIA in the last 168 hours. Coagulation Profile: Recent Labs  Lab 10/08/20 1204  INR 1.4*   Cardiac Enzymes: No results for input(s): CKTOTAL, CKMB, CKMBINDEX, TROPONINI in the last 168 hours. BNP (last 3 results) No results for input(s): PROBNP in the last 8760 hours. HbA1C: No results for input(s): HGBA1C in the last 72 hours. CBG: No results for input(s): GLUCAP in the last 168 hours. Lipid Profile: No results for input(s): CHOL, HDL, LDLCALC, TRIG, CHOLHDL, LDLDIRECT in the last 72 hours. Thyroid Function Tests: No results for input(s): TSH, T4TOTAL, FREET4, T3FREE, THYROIDAB in the last 72 hours. Anemia Panel: No results for input(s): VITAMINB12, FOLATE, FERRITIN, TIBC, IRON, RETICCTPCT in the last 72 hours. Urine analysis:    Component Value Date/Time   COLORURINE YELLOW 07/13/2020 1240   APPEARANCEUR CLOUDY (A) 07/13/2020 1240   LABSPEC 1.010 07/13/2020 1240   PHURINE 5.0 07/13/2020 1240   GLUCOSEU NEGATIVE 07/13/2020 1240   HGBUR NEGATIVE 07/13/2020 1240   [place] 07/13/2020 1240   BILIRUBINUR negative 11/28/2019 1206   KETONESUR NEGATIVE 07/13/2020 1240    PROTEINUR NEGATIVE 07/13/2020 1240   UROBILINOGEN 0.2 11/28/2019 1206   UROBILINOGEN 0.2 10/12/2012 2037   NITRITE NEGATIVE 07/13/2020 1240   LEUKOCYTESUR MODERATE (A) 07/13/2020 1240   Sepsis Labs: Recent Results (from the past 240 hour(s))  Resp Panel by RT-PCR (Flu A&B, Covid) Nasopharyngeal Swab     Status: None   Collection Time: 10/08/20 11:23 AM   Specimen: Nasopharyngeal Swab; Nasopharyngeal(NP) swabs in vial transport medium  Result Value Ref Range Status   SARS Coronavirus 2 by RT PCR NEGATIVE NEGATIVE Final    Comment: (NOTE) SARS-CoV-2 target nucleic acids are NOT DETECTED.  The SARS-CoV-2 RNA is generally detectable in upper respiratory specimens during the acute phase of infection. The lowest concentration of SARS-CoV-2 viral copies this assay can detect is 138 copies/mL. A negative result does not preclude SARS-Cov-2 infection and should not be used as the sole basis for treatment or other patient management decisions. A negative result may occur with  improper specimen collection/handling, submission of specimen other than nasopharyngeal swab, presence of viral mutation(s) within the areas targeted by this assay, and inadequate number of viral copies(<138 copies/mL). A negative result must be combined with clinical observations, patient history, and epidemiological information. The expected result is Negative.  Fact Sheet for Patients:  EntrepreneurPulse.com.au  Fact Sheet for Healthcare Providers:  IncredibleEmployment.be  This test is no t yet approved or cleared by the [place] FDA and  has been authorized for detection and/or diagnosis of SARS-CoV-2 by FDA under an Emergency Use Authorization (EUA). This EUA will remain  in effect (meaning this test can be used) for the duration of the COVID-19 declaration under Section 564(b)(1) of the Act, 21 U.S.C.section 360bbb-3(b)(1), unless the authorization is terminated  or  revoked sooner.       Influenza A by PCR NEGATIVE NEGATIVE Final   Influenza B by PCR NEGATIVE NEGATIVE Final    Comment: (NOTE) The Xpert Xpress SARS-CoV-2/FLU/RSV plus assay is intended as an aid in the diagnosis of influenza from Nasopharyngeal swab specimens and should not be used as a sole basis for treatment. Nasal washings and aspirates are unacceptable for Xpert Xpress SARS-CoV-2/FLU/RSV testing.  Fact Sheet for Patients: EntrepreneurPulse.com.au  Fact Sheet for Healthcare Providers: IncredibleEmployment.be  This test is not yet approved or cleared by the [place] FDA and has been authorized for detection and/or diagnosis of SARS-CoV-2 by FDA under an Emergency Use Authorization (EUA). This EUA will remain in effect (meaning this test can be used) for the duration of the COVID-19 declaration under Section 564(b)(1) of the Act, 21 U.S.C. section 360bbb-3(b)(1), unless the authorization is terminated or revoked.  Performed at [facility], [place] [address] [place], [place] [address]      Radiological Exams on Admission: DG Chest Port 1 View  Result Date: 10/08/2020 CLINICAL DATA:  Shortness of breath, leg swelling since Monday, questionable sepsis, pneumonia on antibiotics EXAM: PORTABLE CHEST 1 VIEW COMPARISON:  Portable exam 1125 hours compared to 06/20/2020 FINDINGS: LEFT subclavian sequential transvenous pacemaker leads project at RIGHT atrium and RIGHT ventricle. Enlargement of cardiac silhouette. Mediastinal contours and pulmonary vascularity normal. Bronchitic changes with bibasilar interstitial infiltrates new since previous exam. Upper lungs clear. No definite pleural effusion or pneumothorax. IMPRESSION: New interstitial infiltrates in the mid to lower lungs bilaterally which could represent edema or infection. Enlargement of cardiac silhouette post pacemaker. Electronically Signed   By: Lavonia Dana M.D.   On: 10/08/2020  11:47    EKG: Independently reviewed.  Paced rhythm 61 bpm  Assessment/Plan Combined systolic and diastolic CHF exacerbation: Acute on chronic.  Patient presents with worsening shortness of breath over last 3 days with reports of weight gain and leg swelling.  On physical exam patient was pitting edema and crackles on lung exam.  Last echocardiogram in 05/2020 revealed EF was noted to be around 30% with grade 1 diastolic dysfunction.  BNP was found to be elevated at 2506.1. -Admit to a telemetry bed -Heart failure orders set  initiated  -Continuous pulse oximetry with nasal cannula oxygen as needed to keep O2 saturations >92% -Strict I&Os and daily weights -Elevate lower extremities -Lasix 40 mg IV Bid -Reassess in a.m. and adjust diuresis as needed.  Suspected pneumonia: Patient was noted to be febrile up to 100.5 F with x-ray showing new interstitial infiltrates in the mid and lower lungs bilaterally.  Patient had already completed a course of azithromycin while at the rehab facility.  Given patient's reports of food feeling like he is getting stuck in her throat and vomiting question possibility of aspiration. -Aspiration precautions -Elevate the head of the bed -Check procalcitonin -Continue Rocephin and doxycycline  Dysphagia: On chronic patient reports having difficulty swallowing and has had several episodes of vomiting. -Precautions as noted above -Speech therapy consulted  Chest pain with elevated troponin: Patient reports chest discomfort on the left side.  High-sensitivity troponin 37->38.  Denies having any chest pain at this present time. -Continue to monitor  History of recurrent DVT on chronic anticoagulation: Patient is on Eliquis due to prior history of recurrent DVTs last noted -Continue Eliquis  History of SVT s/p PM: Patient status post ablation and currently on amiodarone 200 mg daily and does not take on Sundays.  She has a Biotronik pacemaker that was placed in  June of 2020. -Continue amiodarone -Nursing care order placed to have pacemaker interrogated  Essential hypertension: Home blood pressure medications include metoprolol 25 mg and Torsemide 20 mg daily. -Continue metoprolol -Hold oral torsemide following IV diuresis  Chronic kidney disease stage IIIb: Creatinine 1.49 on admission which appears similar to previous. -Continue to monitor  Diabetes mellitus type 2, uncontrolled:Last hemoglobin A1c noted to be 9 on 07/14/2020. -Hypoglycemic protocol -Continue lantus 15 units -CBGs before every meal and at bedtime with sensitive SSI  History of COVID-19 infection: Previously diagnosed in August of this year.  Prior history of CVA -Continue statin and I will  Anemia of chronic disease: Hemoglobin 10.7 g/dL which appears near patient's baseline. -Continue to monitor  Thrombocytopenia: Acute on chronic.  Platelet count 113, but no reports of bleeding. -Continue to monitor.  Hyperlipidemia -Continue atorvastatin  History of breast cancer: Patient is on neoadjuvant antiestrogen treatment anastrozole 1mg  tablet daily.  Followed in outpatient setting by Dr. Lindi Adie. -Continue current medication regimen -Continue follow-up with oncology in the outpatient setting.  GERD: Patient appears to be on Pepcid 40 mg twice daily and omeprazole 20 mg. -Continue Pepcid   DVT prophylaxis: Eliquis Code Status: Patient reports that she like to remain a full code at this time Family Communication: Daughter updated over the phone Disposition Plan: Likely discharged back to rehab facility once Consults called: None Admission status: Inpatient, require more than 2 midnight stay for  Norval Morton MD [facility]   If 7PM-7AM, please contact night-coverage   10/08/2020, 1:23 PM

## 2020-10-08 NOTE — Telephone Encounter (Signed)
The patient daughter [name]) [place] states the patient is very SOB. The pt is being treated for pneumonia. [facility], on the [facility] in [place] [phone]. She wants to know anything is on the transmission and if we can call the nursing home and let them know what we find on the transmission because she do not think they are listening to her.

## 2020-10-09 ENCOUNTER — Encounter (HOSPITAL_COMMUNITY): Payer: Self-pay | Admitting: Internal Medicine

## 2020-10-09 ENCOUNTER — Other Ambulatory Visit: Payer: Self-pay

## 2020-10-09 ENCOUNTER — Ambulatory Visit: Payer: Medicare Other | Admitting: Podiatry

## 2020-10-09 DIAGNOSIS — D696 Thrombocytopenia, unspecified: Secondary | ICD-10-CM

## 2020-10-09 DIAGNOSIS — R1319 Other dysphagia: Secondary | ICD-10-CM

## 2020-10-09 DIAGNOSIS — I5043 Acute on chronic combined systolic (congestive) and diastolic (congestive) heart failure: Secondary | ICD-10-CM

## 2020-10-09 LAB — URINALYSIS, ROUTINE W REFLEX MICROSCOPIC
Bacteria, UA: NONE SEEN
Bilirubin Urine: NEGATIVE
Glucose, UA: NEGATIVE mg/dL
Hgb urine dipstick: NEGATIVE
Ketones, ur: NEGATIVE mg/dL
Nitrite: NEGATIVE
Protein, ur: NEGATIVE mg/dL
Specific Gravity, Urine: 1.01 (ref 1.005–1.030)
pH: 6 (ref 5.0–8.0)

## 2020-10-09 LAB — BASIC METABOLIC PANEL
Anion gap: 12 (ref 5–15)
BUN: 18 mg/dL (ref 8–23)
CO2: 27 mmol/L (ref 22–32)
Calcium: 8.7 mg/dL — ABNORMAL LOW (ref 8.9–10.3)
Chloride: 104 mmol/L (ref 98–111)
Creatinine, Ser: 1.35 mg/dL — ABNORMAL HIGH (ref 0.44–1.00)
GFR, Estimated: 39 mL/min — ABNORMAL LOW (ref >60)
Glucose, Bld: 190 mg/dL — ABNORMAL HIGH (ref 70–99)
Potassium: 3.3 mmol/L — ABNORMAL LOW (ref 3.5–5.1)
Sodium: 143 mmol/L (ref 135–145)

## 2020-10-09 LAB — GLUCOSE, CAPILLARY
Glucose-Capillary: 160 mg/dL — ABNORMAL HIGH (ref 70–99)
Glucose-Capillary: 172 mg/dL — ABNORMAL HIGH (ref 70–99)
Glucose-Capillary: 220 mg/dL — ABNORMAL HIGH (ref 70–99)

## 2020-10-09 MED ORDER — POTASSIUM CHLORIDE 20 MEQ PO PACK
40.0000 meq | PACK | Freq: Two times a day (BID) | ORAL | Status: AC
  Administered 2020-10-09 – 2020-10-10 (×2): 40 meq via ORAL
  Filled 2020-10-09 (×2): qty 2

## 2020-10-09 MED ORDER — FUROSEMIDE 10 MG/ML IJ SOLN
40.0000 mg | Freq: Two times a day (BID) | INTRAMUSCULAR | Status: DC
  Administered 2020-10-09 – 2020-10-11 (×5): 40 mg via INTRAVENOUS
  Filled 2020-10-09 (×5): qty 4

## 2020-10-09 NOTE — Progress Notes (Signed)
[facility] PROGRESS NOTE    Progress Note  [name]  EXH:[id number] DOB: Mar 03, 1933 DOA: 10/08/2020 PCP: [name], MD     Brief Narrative:   [name] is an 86 y.o. female past medical history of combined systolic and diastolic heart failure, essential hypertension, chronic kidney stage III with a history of CVA recurrent DVT on Eliquis left bundle branch block SVT status post ablation and a recent Covid 19 infection on 06/12/2020 comes into the hospital for shortness of breath that started 3 days prior to admission, in the ED he was found to have a temperature of 100.5 breathing 20 times per minute with a white count of 4 hemoglobin of 10 lactic acid of 1.4 chest x-ray showed new infiltrates in both lung fields influenza and SARS-CoV-2 PCR were negative, he was started on IV Lasix Rocephin and doxy  Assessment/Plan:   Acute on chronic combined systolic and diastolic CHF (congestive heart failure): Last 2D echo on September 2021 that showed an EF of 30% global hypokinesia grade 1 diastolic heart failure. Chest x-ray done in the ED showed bilateral infiltrates with a BNP of greater than 2000 ( September was around 850). Strict I's and O's Daily weights monitor electrolytes and replete as needed. Have only about 500 cc, but she only received 40 mg of Lasix, will go ahead and increase her to 40 IV twice daily. She appears significantly fluid overloaded on physical exam restrict her fluids Consult physical therapy.  Dysphagia: She reports having difficulty with swallowing and episodes of vomiting speech has been consulted.  Elevated troponins: High sensitive troponins have basically remained flat there is likely demand ischemia in the setting of heart failure.  History of recurrent DVT: Continue Eliquis.  History of SVT status post pacemaker: Continue amiodarone 200 mg daily. Pacer to be interrogated.  Essential hypertension: Continue metoprolol, blood pressure is  relatively controlled.  Chronic kidney disease stage IIIb: Creatinine appears to be at baseline.  Diabetes mellitus type 2: With an A1c of 7.5,  she was started on long-acting insulin and sliding scale. Her blood glucose running around 160/90 will continue current regimen.  CVA: Continue statins and aspirin.  Anemia of chronic disease: Hemoglobin appears to be at baseline.  Chronic thrombocytopenia: No signs of bleeding, platelets are greater than 100.  History of breast cancer: Continue current regimen follow-up with oncology as an outpatient.   DVT prophylaxis: Eliquis Family Communication:none Status is: Inpatient  Remains inpatient appropriate because:Hemodynamically unstable   Dispo: The patient is from: SNF              Anticipated d/c is to: SNF              Anticipated d/c date is: 3 days              Patient currently is not medically stable to d/c.        Code Status:     Code Status Orders  (From admission, onward)         Start     Ordered   10/08/20 1340  Full code  Continuous        10/08/20 1339        Code Status History    Date Active Date Inactive Code Status Order ID Comments User Context   07/16/2020 2000 07/21/2020 0437 DNR [id number]  Pershing Proud, NP Inpatient   07/13/2020 2011 07/16/2020 2000 Full Code [id number]  Toy Baker, MD ED   06/22/2020 2359  07/01/2020 1904 Full Code [id number]  Elwyn Reach, MD ED   08/10/2019 1954 08/22/2019 2119 Full Code [id number]  Etta Quill, DO ED   06/08/2019 2038 06/11/2019 2239 Full Code [id number]  Neena Rhymes, MD ED   11/06/2018 2300 11/09/2018 2152 Full Code [id number]  Rise Patience, MD Inpatient   Advance Care Planning Activity    Advance Directive Documentation   Flowsheet Row Most Recent Value  Type of Advance Directive Healthcare Power of Attorney  Pre-existing out of facility DNR order (yellow form or pink MOST form) --  "MOST" Form in Place? --        IV  Access:    Peripheral IV   Procedures and diagnostic studies:   DG Chest Port 1 View  Result Date: 10/08/2020 CLINICAL DATA:  Shortness of breath, leg swelling since Monday, questionable sepsis, pneumonia on antibiotics EXAM: PORTABLE CHEST 1 VIEW COMPARISON:  Portable exam 1125 hours compared to 06/20/2020 FINDINGS: LEFT subclavian sequential transvenous pacemaker leads project at RIGHT atrium and RIGHT ventricle. Enlargement of cardiac silhouette. Mediastinal contours and pulmonary vascularity normal. Bronchitic changes with bibasilar interstitial infiltrates new since previous exam. Upper lungs clear. No definite pleural effusion or pneumothorax. IMPRESSION: New interstitial infiltrates in the mid to lower lungs bilaterally which could represent edema or infection. Enlargement of cardiac silhouette post pacemaker. Electronically Signed   By: Lavonia Dana M.D.   On: 10/08/2020 11:47     Medical Consultants:    None.  Anti-Infectives:   none  Subjective:    [name] she relates her breathing is not better, cannot lay flat to sleep  Objective:    Vitals:   10/08/20 2032 10/09/20 0030 10/09/20 0447 10/09/20 0500  BP: (!) 151/84 (!) 147/87 (!) 145/96   Pulse: (!) 59 61 (!) 59   Resp: 20 20 20    Temp: 98.9 F (37.2 C) (!) 97.5 F (36.4 C) 98.6 F (37 C)   TempSrc: Oral Oral Oral   SpO2: 100%  99%   Weight: 84.3 kg 84.3 kg  84.3 kg  Height:       SpO2: 99 %   Intake/Output Summary (Last 24 hours) at 10/09/2020 0734 Last data filed at 10/08/2020 2300 Gross per 24 hour  Intake 243 ml  Output 800 ml  Net -557 ml   Filed Weights   10/08/20 2032 10/09/20 0030 10/09/20 0500  Weight: 84.3 kg 84.3 kg 84.3 kg    Exam: General exam: In no acute distress. Respiratory system: Good air movement and crackles at baseline bilaterally Cardiovascular system: S1 & S2 heard, RRR. +JVD Gastrointestinal system: Abdomen is nondistended, soft and nontender.  Extremities: No  pedal edema. Skin: No rashes, lesions or ulcers Psychiatry: Judgement and insight appear normal. Mood & affect appropriate.   Data Reviewed:    Labs: Basic Metabolic Panel: Recent Labs  Lab 10/08/20 1202 10/08/20 1204 10/09/20 0313  NA 146* 144 143  K 3.6 3.7 3.3*  CL  --  102 104  CO2  --  27 27  GLUCOSE  --  177* 190*  BUN  --  19 18  CREATININE  --  1.49* 1.35*  CALCIUM  --  9.1 8.7*   GFR Estimated Creatinine Clearance: 32.7 mL/min (A) (by C-G formula based on SCr of 1.35 mg/dL (H)). Liver Function Tests: Recent Labs  Lab 10/08/20 1204  AST 38  ALT 56*  ALKPHOS 95  BILITOT 0.9  PROT 7.4  ALBUMIN 3.7   No results  for input(s): LIPASE, AMYLASE in the last 168 hours. No results for input(s): AMMONIA in the last 168 hours. Coagulation profile Recent Labs  Lab 10/08/20 1204  INR 1.4*   COVID-19 Labs  No results for input(s): DDIMER, FERRITIN, LDH, CRP in the last 72 hours.  Lab Results  Component Value Date   SARSCOV2NAA NEGATIVE 10/08/2020   SARSCOV2NAA NEGATIVE 07/20/2020   SARSCOV2NAA NEGATIVE 07/17/2020   SARSCOV2NAA POSITIVE (A) 07/13/2020    CBC: Recent Labs  Lab 10/08/20 1202 10/08/20 1204  WBC  --  4.0  NEUTROABS  --  2.3  HGB 10.5* 10.7*  HCT 31.0* 35.7*  MCV  --  98.6  PLT  --  113*   Cardiac Enzymes: No results for input(s): CKTOTAL, CKMB, CKMBINDEX, TROPONINI in the last 168 hours. BNP (last 3 results) No results for input(s): PROBNP in the last 8760 hours. CBG: Recent Labs  Lab 10/08/20 1631 10/08/20 2118 10/09/20 0606  GLUCAP 126* 209* 160*   D-Dimer: No results for input(s): DDIMER in the last 72 hours. Hgb A1c: Recent Labs    10/08/20 1121  HGBA1C 7.5*   Lipid Profile: No results for input(s): CHOL, HDL, LDLCALC, TRIG, CHOLHDL, LDLDIRECT in the last 72 hours. Thyroid function studies: No results for input(s): TSH, T4TOTAL, T3FREE, THYROIDAB in the last 72 hours.  Invalid input(s): FREET3 Anemia work up: No  results for input(s): VITAMINB12, FOLATE, FERRITIN, TIBC, IRON, RETICCTPCT in the last 72 hours. Sepsis Labs: Recent Labs  Lab 10/08/20 1202 10/08/20 1204 10/08/20 1321  PROCALCITON  --   --  <0.10  WBC  --  4.0  --   LATICACIDVEN 1.4  --  1.6   Microbiology Recent Results (from the past 240 hour(s))  Resp Panel by RT-PCR (Flu A&B, Covid) Nasopharyngeal Swab     Status: None   Collection Time: 10/08/20 11:23 AM   Specimen: Nasopharyngeal Swab; Nasopharyngeal(NP) swabs in vial transport medium  Result Value Ref Range Status   SARS Coronavirus 2 by RT PCR NEGATIVE NEGATIVE Final    Comment: (NOTE) SARS-CoV-2 target nucleic acids are NOT DETECTED.  The SARS-CoV-2 RNA is generally detectable in upper respiratory specimens during the acute phase of infection. The lowest concentration of SARS-CoV-2 viral copies this assay can detect is 138 copies/mL. A negative result does not preclude SARS-Cov-2 infection and should not be used as the sole basis for treatment or other patient management decisions. A negative result may occur with  improper specimen collection/handling, submission of specimen other than nasopharyngeal swab, presence of viral mutation(s) within the areas targeted by this assay, and inadequate number of viral copies(<138 copies/mL). A negative result must be combined with clinical observations, patient history, and epidemiological information. The expected result is Negative.  Fact Sheet for Patients:  EntrepreneurPulse.com.au  Fact Sheet for Healthcare Providers:  IncredibleEmployment.be  This test is no t yet approved or cleared by the [place] FDA and  has been authorized for detection and/or diagnosis of SARS-CoV-2 by FDA under an Emergency Use Authorization (EUA). This EUA will remain  in effect (meaning this test can be used) for the duration of the COVID-19 declaration under Section 564(b)(1) of the Act,  21 U.S.C.section 360bbb-3(b)(1), unless the authorization is terminated  or revoked sooner.       Influenza A by PCR NEGATIVE NEGATIVE Final   Influenza B by PCR NEGATIVE NEGATIVE Final    Comment: (NOTE) The Xpert Xpress SARS-CoV-2/FLU/RSV plus assay is intended as an aid in the diagnosis of influenza  from Nasopharyngeal swab specimens and should not be used as a sole basis for treatment. Nasal washings and aspirates are unacceptable for Xpert Xpress SARS-CoV-2/FLU/RSV testing.  Fact Sheet for Patients: EntrepreneurPulse.com.au  Fact Sheet for Healthcare Providers: IncredibleEmployment.be  This test is not yet approved or cleared by the [place] FDA and has been authorized for detection and/or diagnosis of SARS-CoV-2 by FDA under an Emergency Use Authorization (EUA). This EUA will remain in effect (meaning this test can be used) for the duration of the COVID-19 declaration under Section 564(b)(1) of the Act, 21 U.S.C. section 360bbb-3(b)(1), unless the authorization is terminated or revoked.  Performed at [facility], [place] [address] [place], [place] [address]      Medications:   . amiodarone  200 mg Oral Once per day on Mon Tue Wed Thu Fri Sat  . apixaban  2.5 mg Oral BID  . atorvastatin  20 mg Oral QHS  . diclofenac Sodium  1 application Topical QID  . docusate sodium  100 mg Oral Daily  . doxycycline  100 mg Oral Q12H  . famotidine  40 mg Oral Daily  . guaiFENesin  600 mg Oral BID  . insulin aspart  0-9 Units Subcutaneous TID WC  . insulin glargine  15 Units Subcutaneous QHS  . metoprolol succinate  25 mg Oral Daily  . mirtazapine  7.5 mg Oral QHS  . oxybutynin  5 mg Oral BID  . pregabalin  75 mg Oral QHS  . sodium chloride flush  3 mL Intravenous Q12H   Continuous Infusions: . sodium chloride    . cefTRIAXone (ROCEPHIN)  IV        LOS: 1 day   Charlynne Cousins  [facility]  10/09/2020, 7:34  AM

## 2020-10-09 NOTE — TOC Initial Note (Addendum)
Transition of Care [facility]) - Initial/Assessment Note    Patient Details  Name: [name] MRN: [id number] Date of Birth: 1933/07/26  [facility]) CM/SW Contact:    Bethann Berkshire, [place] Phone Number: 10/09/2020, 12:13 PM  Clinical Narrative:                  CSW met with pt to discuss disposition. Pt plans to return to [facility]. She gives CSW consent to discuss with her daughter.   CSW called left message with the [facility] admissions to inform and confirm pt's status there. Requested call back.   CSW Spoke with pt daughter over the phone. Daughter confirmed pt from [facility] and plans to return there. Daughter reports she is unable to take care of pt at home.  1524: CSW called left message with [facility] requesting return call.    Expected Discharge Plan: Skilled Nursing Facility Barriers to Discharge: Continued Medical Work up   Patient Goals and CMS Choice Patient states their goals for this hospitalization and ongoing recovery are:: Return to SNF      Expected Discharge Plan and Services Expected Discharge Plan: [place]                                              Prior Living Arrangements/Services     Patient language and need for interpreter reviewed:: Yes        Need for Family Participation in Patient Care: Yes (Comment) Care giver support system in place?: Yes (comment)   Criminal Activity/Legal Involvement Pertinent to Current Situation/Hospitalization: No - Comment as needed  Activities of Daily Living Home Assistive Devices/Equipment: Walker (specify type),Wheelchair ADL Screening (condition at time of admission) Patient's cognitive ability adequate to safely complete daily activities?: Yes Is the patient deaf or have difficulty hearing?: Yes Does the patient have difficulty seeing, even when wearing glasses/contacts?: No Does the patient have difficulty concentrating, remembering, or making  decisions?: Yes Patient able to express need for assistance with ADLs?: Yes Does the patient have difficulty dressing or bathing?: Yes Independently performs ADLs?: No Communication: Independent Does the patient have difficulty walking or climbing stairs?: Yes Weakness of Legs: Both Weakness of Arms/Hands: Both  Permission Sought/Granted                  Emotional Assessment Appearance:: Appears stated age Attitude/Demeanor/Rapport: Lethargic Affect (typically observed): Flat Orientation: : Oriented to Self,Oriented to Place,Oriented to  Time,Oriented to Situation Alcohol / Substance Use: Not Applicable Psych Involvement: No (comment)  Admission diagnosis:  Acute on chronic combined systolic and diastolic CHF (congestive heart failure) ([place]) [I50.43] Community acquired pneumonia, unspecified laterality [J18.9] Patient Active Problem List   Diagnosis Date Noted  . Acute on chronic combined systolic and diastolic CHF (congestive heart failure) ([place]) 10/08/2020  . GERD (gastroesophageal reflux disease) 10/08/2020  . Declining functional status   . Goals of care, counseling/discussion   . Palliative care by specialist   . Aspiration pneumonia ([place]) 07/13/2020  . Hypoglycemia 07/13/2020  . Elevated troponin 07/13/2020  . Sepsis ([place]) 07/13/2020  . Syncope and collapse 06/22/2020  . COVID-19 virus infection 06/22/2020  . Hypokalemia 06/22/2020  . Pacemaker 05/06/2020  . Syncope 08/10/2019  . Displaced fracture of lateral malleolus of right fibula, initial encounter for closed fracture 07/10/2019  . Pain in left shoulder 07/10/2019  .  Acute CHF (congestive heart failure) ([place]) 06/09/2019  . Acute pulmonary edema (HCC)   . Personal history of breast cancer 04/22/2019  . Pressure ulcer 01/04/2019  . Acute pancreatitis 01/02/2019  . Malignant neoplasm of lower-inner quadrant of right breast of female, estrogen receptor positive ([place]) 11/23/2018  . Hematoma of right iliopsoas  muscle 11/06/2018  . Intramuscular hematoma 11/06/2018  . Hypertensive heart disease with heart failure ([place]) 09/05/2017  . Aphasia 08/31/2017  . Dysphagia 08/31/2017  . CKD (chronic kidney disease), stage III ([place])   . LBBB (left bundle branch block)   . Anemia   . HTN (hypertension)   . Obesity   . DVT (deep venous thrombosis) ([place])   . DM (diabetes mellitus) ([place]) 07/28/2015  . Fall 07/28/2015  . Warfarin-induced coagulopathy ([place]) 07/28/2015  . Acute on chronic diastolic CHF (congestive heart failure) ([place]) 03/19/2014  . Edema 08/06/2013  . SVT (supraventricular tachycardia) ([place]) 10/15/2012  . First degree AV block 10/15/2012  . Acute renal insufficiency 10/14/2012  . Dizziness and giddiness   . Thrombocytopenia ([place]) 11/21/2011   PCP:  [name], MD Pharmacy:   [facility] [phone] - [place], [place] - [address] AT [place] [address] [place] [address] Phone: [phone] Fax: [phone]     Social Determinants of Health ([place]) Interventions    Readmission Risk Interventions Readmission Risk Prevention Plan 01/03/2019 01/02/2019  Transportation Screening (No Data) Complete  PCP or Specialist Appt within 3-5 Days - Complete  HRI or [place] - Complete  Social Work Consult for [place] Planning/Counseling - Complete  Palliative Care Screening - Not Applicable  Medication Review Press photographer) - Complete  Some recent data might be hidden

## 2020-10-09 NOTE — Evaluation (Signed)
Occupational Therapy Evaluation Patient Details Name: [name] MRN: [id number] DOB: 18-Sep-1933 Today's Date: 10/09/2020    History of Present Illness 86 y.o. female past medical history of combined systolic and diastolic heart failure, essential hypertension, chronic kidney stage III with a history of CVA recurrent DVT on Eliquis left bundle branch block SVT status post ablation and a recent Covid 19 infection on 06/12/2020 comes into the hospital for shortness of breath that started 3 days prior to admission per chart.   Clinical Impression   Patient admitted with the above diagnosis.  At her SNF she was largely wheelchair bound for mobility.  She was able to preform squat pivot transfers to the bed and toilet going to her left side.  The patient states she could preform her own self care from a wheelchair level including grooming.  She has not walked in a few moths, but is hoping to restart PT once she returns to the facility.  Currently, due to deficits listed below, she is needing up to [place] for upper body ADL and Mod A for LB ADL.  Also, she is needing up to Mod A for basic transfers.  Based on the declines to her functional status, OT will follow in the acute setting.  A return to her SNF is planned.      Follow Up Recommendations  SNF    Equipment Recommendations  None recommended by OT    Recommendations for Other Services       Precautions / Restrictions Precautions Precautions: Fall Restrictions Other Position/Activity Restrictions: R hemiparesis.      Mobility Bed Mobility Overal bed mobility: Needs Assistance Bed Mobility: Rolling;Supine to Sit;Sit to Supine Rolling: Modified independent (Device/Increase time)   Supine to sit: Min assist Sit to supine: Mod assist   General bed mobility comments: assist with legs and scooting higher in the bed.    Transfers Overall transfer level: Needs assistance Equipment used: Rolling walker (2 wheeled) Transfers: Sit  to/from Stand Sit to Stand: Min assist              Balance Overall balance assessment: Needs assistance Sitting-balance support: No upper extremity supported;Feet supported Sitting balance-Leahy Scale: Fair     Standing balance support: Bilateral upper extremity supported Standing balance-Leahy Scale: Poor Standing balance comment: needs RW                           ADL either performed or assessed with clinical judgement   ADL Overall ADL's : Needs assistance/impaired Eating/Feeding: Independent;Bed level   Grooming: Wash/dry hands;Wash/dry face;Sitting;Min guard           Upper Body Dressing : Minimal assistance;Sitting   Lower Body Dressing: Moderate assistance;Sit to/from stand Lower Body Dressing Details (indicate cue type and reason): difficulty maintaining stand balance without RW             Functional mobility during ADLs: Minimal assistance;Rolling walker       Vision Baseline Vision/History: Wears glasses Wears Glasses: At all times Patient Visual Report: No change from baseline       Perception     Praxis      Pertinent Vitals/Pain Pain Assessment: No/denies pain     Hand Dominance Right   Extremity/Trunk Assessment Upper Extremity Assessment Upper Extremity Assessment: Generalized weakness;RUE deficits/detail RUE Deficits / Details: 3/5 for shoulder MMT. RUE Sensation: WNL RUE Coordination: WNL   Lower Extremity Assessment Lower Extremity Assessment: Defer to PT evaluation  Cervical / Trunk Assessment Cervical / Trunk Assessment: Kyphotic   Communication Communication Communication: Expressive difficulties   Cognition Arousal/Alertness: Lethargic Behavior During Therapy: WFL for tasks assessed/performed Overall Cognitive Status: Within Functional Limits for tasks assessed                                 General Comments: Patient is very sleepy, states she has not rested well since admission.    General Comments   O2 sats 95% on 2 L via [place] and HR at 61 BPM.  No dizziness noted.      Exercises     Shoulder Instructions      Home Living Family/patient expects to be discharged to:: Skilled nursing facility                                 Additional Comments: Patient states she uses w/c for mobility in her room and in the facility; pt states she is mod I with transfers w/c to commode; states she is setup for ADL at w/c level.      Prior Functioning/Environment Level of Independence:  (Needs assist)        Comments: Setup and supervision for ADL, mobility at w/c level, Independent for self feeding, occasional assist for hygiene.  Has not walked in a few months.        OT Problem List: Decreased strength;Decreased activity tolerance;Impaired balance (sitting and/or standing)      OT Treatment/Interventions: Self-care/ADL training;Therapeutic exercise;Balance training;Therapeutic activities    OT Goals(Current goals can be found in the care plan section) Acute Rehab OT Goals Patient Stated Goal: I'd like to work on walking again. OT Goal Formulation: With patient Time For Goal Achievement: 10/23/20 Potential to Achieve Goals: Fair  OT Frequency: Min 1X/week   Barriers to D/C: Other (comment)  none noted       Co-evaluation              AM-PAC OT "6 Clicks" Daily Activity     Outcome Measure Help from another person eating meals?: None Help from another person taking care of personal grooming?: A Little Help from another person toileting, which includes using toliet, bedpan, or urinal?: A Lot Help from another person bathing (including washing, rinsing, drying)?: A Lot Help from another person to put on and taking off regular upper body clothing?: A Little Help from another person to put on and taking off regular lower body clothing?: A Lot 6 Click Score: 16   End of Session Equipment Utilized During Treatment: Gait belt;Rolling  walker;Oxygen Nurse Communication: Mobility status  Activity Tolerance: Patient tolerated treatment well Patient left: in bed;with call bell/phone within reach;with bed alarm set  OT Visit Diagnosis: Unsteadiness on feet (R26.81);Muscle weakness (generalized) (M62.81)                Time: 1638-4665 OT Time Calculation (min): 22 min Charges:  OT General Charges $OT Visit: 1 Visit OT Evaluation $OT Eval Moderate Complexity: 1 Mod  10/09/2020  Rich, OTR/L  Acute Rehabilitation Services  Office:  [place] 10/09/2020, 2:08 PM

## 2020-10-09 NOTE — Progress Notes (Signed)
Pt. Has arrived to the unit.

## 2020-10-09 NOTE — Telephone Encounter (Signed)
No change. GT 

## 2020-10-09 NOTE — Progress Notes (Signed)
Reported from night shift nurse that patient felt like a pill was stuck in throat and vomited x1 time in the middle of the night Speech eval was placed.  During report patient is experiencing rapid shallow breathing and asked for 02 n/c placed at 2L.

## 2020-10-09 NOTE — Progress Notes (Addendum)
Patient refused full breakfast stated she will eat banana,milk and juice.

## 2020-10-09 NOTE — Evaluation (Signed)
Clinical/Bedside Swallow Evaluation Patient Details  Name: [name] MRN: [id number] Date of Birth: 1933/07/05  Today's Date: 10/09/2020 Time: SLP Start Time (ACUTE ONLY): 0848 SLP Stop Time (ACUTE ONLY): 0905 SLP Time Calculation (min) (ACUTE ONLY): 17 min  Past Medical History:  Past Medical History:  Diagnosis Date  . Abnormal echocardiogram    a. possible mass on echo 05/2019 on PPM, b. no evidence of mass / atrial lead vegetation on follow up echo 06/2019  . Anemia   . Arthritis   . Breast cancer ([place]) 10/2018   Invasive ductal carcinoma  . Cancer ([place])   . Cataract   . Chronic combined systolic and diastolic CHF (congestive heart failure) ([place])    a. Previously diastolic but patient AVS from outside hospital listed systolic CHF and cardiomyopathy, records pending.  . CKD (chronic kidney disease), stage III ([place])   . Clotting disorder ([place])   . CVA (cerebral vascular accident) ([place])    residual right sided weakness and mild dysphagia  . Diabetes mellitus ([place])   . Dizziness and giddiness   . DVT (deep venous thrombosis) ([facility])    a. on anticoagulation for this.  . Essential hypertension   . LBBB (left bundle branch block)   . Mitral regurgitation    a. Mod MR by echo 2014.  . Muscular deconditioning   . Obesity   . Pacemaker   . Pericardial effusion   . SVT (supraventricular tachycardia) ([place])    a. In 2013 she had an EPS with ablation for SVT which did not eliminate the SVT completely. She also had bradycardia which limited medication. She was placed on amiodarone by Dr. Lovena Le.  . Thrombocytopenia ([place]) 11/21/2011   Past Surgical History:  Past Surgical History:  Procedure Laterality Date  . EYE SURGERY    . SUPRAVENTRICULAR TACHYCARDIA ABLATION N/A 10/11/2012   Procedure: SUPRAVENTRICULAR TACHYCARDIA ABLATION;  Surgeon: Evans Lance, MD;  Location: [facility] CATH LAB;  Service: Cardiovascular;  Laterality: N/A;   HPI:  Pt is an 86 yo female presenting with SOB and  fever. CXR shows bilateral infiltrates that could represent edema or infection. MBS in 2018 recommended Dys 3 solids and thin liquids via single sips, with sensed aspiration on larger conscutive sips or when taking a pill with thin liquids. More recent BSE in September 2021 recommended the same diet. Pt describes a recent h/o vomiting and esophageal stretching one year ago. PMH includes: CHF, HTN, CKD, CVA, LBBB, COVID-19, DVT, DM, breast ca   Assessment / Plan / Recommendation Clinical Impression  Pt presents with signs more consistent with an esophageal dysphagia as oropharyngeal swallowing appears to be grossly functional. She does take a little extra time for mastication in light of missing dentition, and she says that she typically consumes foods that are chopped or pureed. Pt subjectively describes solids getting stuck and an increase in frequency of regurgitation over the past week or so. SLP will modify diet to Dys 2 (finely chopped) solids and thin liquids, which per pt description, appears to be her baseline diet PTA. Would also follow esophageal/aspiration precautions and consider a more dedicated test of the esophagus. SLP to f/u briefly in light of diet PNA with emphasis on education and reinforcement of swallowing strategies.  SLP Visit Diagnosis: Dysphagia, unspecified (R13.10)    Aspiration Risk  Mild aspiration risk    Diet Recommendation Dysphagia 2 (Fine chop);Thin liquid   Liquid Administration via: Cup;Straw Medication Administration: Whole meds with puree Supervision: Patient able to  self feed;Intermittent supervision to cue for compensatory strategies Compensations: Slow rate;Small sips/bites;Follow solids with liquid Postural Changes: Seated upright at 90 degrees;Remain upright for at least 30 minutes after po intake    Other  Recommendations Oral Care Recommendations: Oral care BID   Follow up Recommendations None      Frequency and Duration min 1 x/week  1 week        Prognosis Prognosis for Safe Diet Advancement: Good      Swallow Study   General HPI: Pt is an 86 yo female presenting with SOB and fever. CXR shows bilateral infiltrates that could represent edema or infection. MBS in 2018 recommended Dys 3 solids and thin liquids via single sips, with sensed aspiration on larger conscutive sips or when taking a pill with thin liquids. More recent BSE in September 2021 recommended the same diet. Pt describes a recent h/o vomiting and esophageal stretching one year ago. PMH includes: CHF, HTN, CKD, CVA, LBBB, COVID-19, DVT, DM, breast ca Type of Study: Bedside Swallow Evaluation Previous Swallow Assessment: see HPI Diet Prior to this Study: Dysphagia 3 (soft);Thin liquids Temperature Spikes Noted: Yes (100.5) Respiratory Status: Nasal cannula History of Recent Intubation: No Behavior/Cognition: Alert;Cooperative Oral Cavity Assessment: Within Functional Limits Oral Care Completed by SLP: No Oral Cavity - Dentition: Dentures, top;Other (Comment) (pt says that she does not wear bottom dentures) Vision: Functional for self-feeding Self-Feeding Abilities: Able to feed self Patient Positioning: Upright in bed Baseline Vocal Quality: Normal Volitional Cough: Strong Volitional Swallow: Able to elicit    Oral/Motor/Sensory Function Overall Oral Motor/Sensory Function: Within functional limits   Ice Chips Ice chips: Not tested   Thin Liquid Thin Liquid: Within functional limits Presentation: Cup;Self Fed;Straw    Nectar Thick Nectar Thick Liquid: Not tested   Honey Thick Honey Thick Liquid: Not tested   Puree Puree: Within functional limits Presentation: Self Fed;Spoon   Solid     Solid: Within functional limits Presentation: Self Fed      Osie Bond., M.A. [place] Pager [phone] Office [phone]  10/09/2020,9:13 AM

## 2020-10-09 NOTE — Evaluation (Signed)
Physical Therapy Evaluation Patient Details Name: [name] MRN: [id number] DOB: 09-30-1933 Today's Date: 10/09/2020   History of Present Illness  86 y.o. female past medical history of combined systolic and diastolic heart failure, HTN, CKD,  CVA, OA,  recurrent DVT, pacer,  SVT status post ablation and a recent Covid 19 infection on 06/12/2020 comes into the hospital for shortness of breath. Pt with acute on chronic heart failure.  Clinical Impression  Pt admitted with above diagnosis and presents to PT with functional limitations due to deficits listed below (See PT problem list). Pt needs skilled PT to maximize independence and safety to allow discharge back to SNF.      Follow Up Recommendations SNF    Equipment Recommendations  None recommended by PT    Recommendations for Other Services       Precautions / Restrictions Precautions Precautions: Fall Restrictions Other Position/Activity Restrictions: R hemiparesis.      Mobility  Bed Mobility Overal bed mobility: Needs Assistance Bed Mobility: Supine to Sit;Sit to Supine Rolling: Modified independent (Device/Increase time)   Supine to sit: Min assist Sit to supine: Min assist   General bed mobility comments: Assist to elevate trunk into sitting and bring hips to EOB. Assist to bring legs back up into bed.    Transfers Overall transfer level: Needs assistance Equipment used: Rolling walker (2 wheeled) Transfers: Sit to/from Stand Sit to Stand: Min assist         General transfer comment: Assist to bring hips up and for balance  Ambulation/Gait             General Gait Details: Pt nonambulatory  Stairs            Wheelchair Mobility    Modified Rankin (Stroke Patients Only)       Balance Overall balance assessment: Needs assistance Sitting-balance support: No upper extremity supported;Feet supported Sitting balance-Leahy Scale: Fair     Standing balance support: Bilateral upper  extremity supported Standing balance-Leahy Scale: Poor Standing balance comment: walker and min assist for static standing                             Pertinent Vitals/Pain Pain Assessment: No/denies pain Faces Pain Scale: No hurt    Home Living Family/patient expects to be discharged to:: Skilled nursing facility                 Additional Comments: Patient states she uses w/c for mobility in her room and in the facility; pt states she is mod I with transfers w/c to commode; states she is setup for ADL at w/c level.    Prior Function Level of Independence: Needs assistance   Gait / Transfers Assistance Needed: modified independent with w/c transfer. Non ambulatory     Comments: Setup and supervision for ADL, mobility at w/c level, Independent for self feeding, occasional assist for hygiene.  Has not walked in a few months.     Hand Dominance   Dominant Hand: Right    Extremity/Trunk Assessment   Upper Extremity Assessment Upper Extremity Assessment: Defer to OT evaluation RUE Deficits / Details: 3/5 for shoulder MMT. RUE Sensation: WNL RUE Coordination: WNL    Lower Extremity Assessment Lower Extremity Assessment: Generalized weakness    Cervical / Trunk Assessment Cervical / Trunk Assessment: Kyphotic  Communication   Communication: Expressive difficulties  Cognition Arousal/Alertness: Awake/alert Behavior During Therapy: WFL for tasks assessed/performed Overall Cognitive Status: Within  Functional Limits for tasks assessed                                 General Comments: Patient is very sleepy, states she has not rested well since admission.      General Comments      Exercises     Assessment/Plan    PT Assessment Patient needs continued PT services  PT Problem List Decreased strength;Decreased balance;Decreased mobility;Obesity       PT Treatment Interventions DME instruction;Functional mobility training;Therapeutic  activities;Therapeutic exercise;Balance training;Patient/family education    PT Goals (Current goals can be found in the Care Plan section)  Acute Rehab PT Goals Patient Stated Goal: I'd like to work on walking again. PT Goal Formulation: With patient Time For Goal Achievement: 10/23/20 Potential to Achieve Goals: Fair    Frequency Min 2X/week   Barriers to discharge        Co-evaluation               AM-PAC PT "6 Clicks" Mobility  Outcome Measure Help needed turning from your back to your side while in a flat bed without using bedrails?: None Help needed moving from lying on your back to sitting on the side of a flat bed without using bedrails?: A Little Help needed moving to and from a bed to a chair (including a wheelchair)?: A Little Help needed standing up from a chair using your arms (e.g., wheelchair or bedside chair)?: A Little Help needed to walk in hospital room?: Total Help needed climbing 3-5 steps with a railing? : Total 6 Click Score: 15    End of Session   Activity Tolerance: Patient tolerated treatment well Patient left: in bed;with call bell/phone within reach;with bed alarm set   PT Visit Diagnosis: Other abnormalities of gait and mobility (R26.89);Muscle weakness (generalized) (M62.81)    Time: 0981-1914 PT Time Calculation (min) (ACUTE ONLY): 10 min   Charges:   PT Evaluation $PT Eval Moderate Complexity: [place] Pager [phone] Office [place] 10/09/2020, 5:23 PM

## 2020-10-10 LAB — BASIC METABOLIC PANEL
Anion gap: 9 (ref 5–15)
BUN: 18 mg/dL (ref 8–23)
CO2: 31 mmol/L (ref 22–32)
Calcium: 8.5 mg/dL — ABNORMAL LOW (ref 8.9–10.3)
Chloride: 106 mmol/L (ref 98–111)
Creatinine, Ser: 1.35 mg/dL — ABNORMAL HIGH (ref 0.44–1.00)
GFR, Estimated: 39 mL/min — ABNORMAL LOW (ref >60)
Glucose, Bld: 127 mg/dL — ABNORMAL HIGH (ref 70–99)
Potassium: 3.3 mmol/L — ABNORMAL LOW (ref 3.5–5.1)
Sodium: 146 mmol/L — ABNORMAL HIGH (ref 135–145)

## 2020-10-10 LAB — GLUCOSE, CAPILLARY
Glucose-Capillary: 126 mg/dL — ABNORMAL HIGH (ref 70–99)
Glucose-Capillary: 83 mg/dL (ref 70–99)
Glucose-Capillary: 101 mg/dL — ABNORMAL HIGH (ref 70–99)
Glucose-Capillary: 200 mg/dL — ABNORMAL HIGH (ref 70–99)
Glucose-Capillary: 190 mg/dL — ABNORMAL HIGH (ref 70–99)

## 2020-10-10 LAB — URINE CULTURE

## 2020-10-10 MED ORDER — LISINOPRIL 5 MG PO TABS
5.0000 mg | ORAL_TABLET | Freq: Every day | ORAL | Status: DC
  Administered 2020-10-10 – 2020-10-13 (×4): 5 mg via ORAL
  Filled 2020-10-10 (×4): qty 1

## 2020-10-10 MED ORDER — POTASSIUM CHLORIDE 20 MEQ PO PACK: 20.0000 meq | PACK | Freq: Two times a day (BID) | ORAL | Status: DC

## 2020-10-10 MED ORDER — POTASSIUM CHLORIDE CRYS ER 20 MEQ PO TBCR
40.0000 meq | EXTENDED_RELEASE_TABLET | Freq: Two times a day (BID) | ORAL | Status: AC
  Administered 2020-10-10 (×2): 40 meq via ORAL
  Filled 2020-10-10 (×2): qty 2

## 2020-10-10 MED ORDER — POTASSIUM CHLORIDE 20 MEQ PO PACK
40.0000 meq | PACK | Freq: Two times a day (BID) | ORAL | Status: DC
  Filled 2020-10-10: qty 2

## 2020-10-10 NOTE — Progress Notes (Signed)
[facility] PROGRESS NOTE    Progress Note  [name]  LNL:[id number] DOB: 03-10-1933 DOA: 10/08/2020 PCP: [name], MD     Brief Narrative:   [name] is an 86 y.o. female past medical history of combined systolic and diastolic heart failure, essential hypertension, chronic kidney stage III with a history of CVA recurrent DVT on Eliquis left bundle branch block SVT status post ablation and a recent Covid 19 infection on 06/12/2020 comes into the hospital for shortness of breath that started 3 days prior to admission, in the ED he was found to have a temperature of 100.5 breathing 20 times per minute with a white count of 4 hemoglobin of 10 lactic acid of 1.4 chest x-ray showed new infiltrates in both lung fields influenza and SARS-CoV-2 PCR were negative, he was started on IV Lasix Rocephin and doxy  Assessment/Plan:   Acute on chronic combined systolic and diastolic CHF (congestive heart failure): Last 2D echo on September 2021 that showed an EF of 30% global hypokinesia grade 1 diastolic heart failure. Chest x-ray done in the ED showed bilateral infiltrates with a BNP of greater than 2000 ( September was around 850). She is negative about 1 L, monitor electrolytes and replete as needed, monitor strict I's and O's and daily weights.  Her weight is unchanged. Her neck veins are still up. Physical therapy evaluated the patient recommended skilled nursing facility. She is now on Lasix, metoprolol and will go ahead and start her on low-dose ACE inhibitor. Her lowest weight in the chart was around 81 kg. Her urine appears clear.  Dysphagia: Speech evaluated the patient and recommended a dysphagia 2 diet. She has remained afebrile no leukocytosis discontinue antibiotics.  Elevated troponins: High sensitive troponins have basically remained flat there is likely demand ischemia in the setting of heart failure.  History of recurrent DVT: Continue Eliquis.  History of SVT  status post pacemaker: Continue amiodarone 200 mg daily. Pacer to be interrogated.  Essential hypertension: Continue metoprolol, blood pressure is relatively controlled.  Chronic kidney disease stage IIIb: Creatinine appears to be at baseline.  Diabetes mellitus type 2: With an A1c of 7.5 continue current regimen blood glucose seems relatively well controlled.  CVA: Continue statins and aspirin.  Anemia of chronic disease: Hemoglobin appears to be at baseline.  Chronic thrombocytopenia: No signs of bleeding, platelets are greater than 100.  History of breast cancer: Continue current regimen follow-up with oncology as an outpatient.   DVT prophylaxis: Eliquis Family Communication:none Status is: Inpatient  Remains inpatient appropriate because:Hemodynamically unstable   Dispo: The patient is from: SNF              Anticipated d/c is to: SNF              Anticipated d/c date is: 3 days              Patient currently is not medically stable to d/c.        Code Status:     Code Status Orders  (From admission, onward)         Start     Ordered   10/08/20 1340  Full code  Continuous        10/08/20 1339        Code Status History    Date Active Date Inactive Code Status Order ID Comments User Context   07/16/2020 2000 07/21/2020 0437 DNR [id number]  Pershing Proud, NP Inpatient   07/13/2020 2011  07/16/2020 2000 Full Code [id number]  Toy Baker, MD ED   06/22/2020 2359 07/01/2020 1904 Full Code [id number]  Elwyn Reach, MD ED   08/10/2019 1954 08/22/2019 2119 Full Code [id number]  Etta Quill, DO ED   06/08/2019 2038 06/11/2019 2239 Full Code [id number]  Neena Rhymes, MD ED   11/06/2018 2300 11/09/2018 2152 Full Code [id number]  Rise Patience, MD Inpatient   Advance Care Planning Activity    Advance Directive Documentation   Flowsheet Row Most Recent Value  Type of Advance Directive Healthcare Power of Attorney  Pre-existing out of  facility DNR order (yellow form or pink MOST form) --  "MOST" Form in Place? --        IV Access:    Peripheral IV   Procedures and diagnostic studies:   DG Chest Port 1 View  Result Date: 10/08/2020 CLINICAL DATA:  Shortness of breath, leg swelling since Monday, questionable sepsis, pneumonia on antibiotics EXAM: PORTABLE CHEST 1 VIEW COMPARISON:  Portable exam 1125 hours compared to 06/20/2020 FINDINGS: LEFT subclavian sequential transvenous pacemaker leads project at RIGHT atrium and RIGHT ventricle. Enlargement of cardiac silhouette. Mediastinal contours and pulmonary vascularity normal. Bronchitic changes with bibasilar interstitial infiltrates new since previous exam. Upper lungs clear. No definite pleural effusion or pneumothorax. IMPRESSION: New interstitial infiltrates in the mid to lower lungs bilaterally which could represent edema or infection. Enlargement of cardiac silhouette post pacemaker. Electronically Signed   By: Lavonia Dana M.D.   On: 10/08/2020 11:47     Medical Consultants:    None.  Anti-Infectives:   none  Subjective:    [name] she relates her breathing is better compared to yesterday.  Objective:    Vitals:   10/09/20 2301 10/10/20 0204 10/10/20 0526 10/10/20 0756  BP: 120/65  136/82 137/79  Pulse: (!) 59  60 (!) 59  Resp: 20  18 16   Temp: 98.3 F (36.8 C)  97.8 F (36.6 C) 98.3 F (36.8 C)  TempSrc: Oral  Oral   SpO2: 98%  98%   Weight:  84.3 kg    Height:       SpO2: 98 % O2 Flow Rate (L/min): 2 L/min   Intake/Output Summary (Last 24 hours) at 10/10/2020 0921 Last data filed at 10/10/2020 0210 Gross per 24 hour  Intake 363 ml  Output 1175 ml  Net -812 ml   Filed Weights   10/09/20 0030 10/09/20 0500 10/10/20 0204  Weight: 84.3 kg 84.3 kg 84.3 kg    Exam: General exam: In no acute distress. Respiratory system: Good air movement and crackles at bases more pronounced on the right. Cardiovascular system: S1 & S2  heard, RRR.  JVD to the earlobe Gastrointestinal system: Abdomen is nondistended, soft and nontender.  Extremities: 2+ edema Skin: No rashes, lesions or ulcers Psychiatry: Judgement and insight appear normal. Mood & affect appropriate.  Data Reviewed:    Labs: Basic Metabolic Panel: Recent Labs  Lab 10/08/20 1202 10/08/20 1204 10/09/20 0313 10/10/20 0347  NA 146* 144 143 146*  K 3.6 3.7 3.3* 3.3*  CL  --  102 104 106  CO2  --  27 27 31   GLUCOSE  --  177* 190* 127*  BUN  --  19 18 18   CREATININE  --  1.49* 1.35* 1.35*  CALCIUM  --  9.1 8.7* 8.5*   GFR Estimated Creatinine Clearance: 32.7 mL/min (A) (by C-G formula based on SCr of 1.35 mg/dL (H)). Liver Function Tests: Recent  Labs  Lab 10/08/20 1204  AST 38  ALT 56*  ALKPHOS 95  BILITOT 0.9  PROT 7.4  ALBUMIN 3.7   No results for input(s): LIPASE, AMYLASE in the last 168 hours. No results for input(s): AMMONIA in the last 168 hours. Coagulation profile Recent Labs  Lab 10/08/20 1204  INR 1.4*   COVID-19 Labs  No results for input(s): DDIMER, FERRITIN, LDH, CRP in the last 72 hours.  Lab Results  Component Value Date   SARSCOV2NAA NEGATIVE 10/08/2020   SARSCOV2NAA NEGATIVE 07/20/2020   SARSCOV2NAA NEGATIVE 07/17/2020   SARSCOV2NAA POSITIVE (A) 07/13/2020    CBC: Recent Labs  Lab 10/08/20 1202 10/08/20 1204  WBC  --  4.0  NEUTROABS  --  2.3  HGB 10.5* 10.7*  HCT 31.0* 35.7*  MCV  --  98.6  PLT  --  113*   Cardiac Enzymes: No results for input(s): CKTOTAL, CKMB, CKMBINDEX, TROPONINI in the last 168 hours. BNP (last 3 results) No results for input(s): PROBNP in the last 8760 hours. CBG: Recent Labs  Lab 10/09/20 0606 10/09/20 1150 10/09/20 2126 10/10/20 0527 10/10/20 0758  GLUCAP 160* 172* 220* 126* 83   D-Dimer: No results for input(s): DDIMER in the last 72 hours. Hgb A1c: Recent Labs    10/08/20 1121  HGBA1C 7.5*   Lipid Profile: No results for input(s): CHOL, HDL, LDLCALC,  TRIG, CHOLHDL, LDLDIRECT in the last 72 hours. Thyroid function studies: No results for input(s): TSH, T4TOTAL, T3FREE, THYROIDAB in the last 72 hours.  Invalid input(s): FREET3 Anemia work up: No results for input(s): VITAMINB12, FOLATE, FERRITIN, TIBC, IRON, RETICCTPCT in the last 72 hours. Sepsis Labs: Recent Labs  Lab 10/08/20 1202 10/08/20 1204 10/08/20 1321  PROCALCITON  --   --  <0.10  WBC  --  4.0  --   LATICACIDVEN 1.4  --  1.6   Microbiology Recent Results (from the past 240 hour(s))  Urine culture     Status: Abnormal   Collection Time: 10/08/20  3:43 AM   Specimen: In/Out Cath Urine  Result Value Ref Range Status   Specimen Description IN/OUT CATH URINE  Final   Special Requests   Final    NONE Performed at [facility], [place] [address], [place], [place] [address]    Culture MULTIPLE SPECIES PRESENT, SUGGEST RECOLLECTION (A)  Final   Report Status 10/10/2020 FINAL  Final  Blood culture (routine single)     Status: None (Preliminary result)   Collection Time: 10/08/20 11:21 AM   Specimen: BLOOD  Result Value Ref Range Status   Specimen Description BLOOD RIGHT ANTECUBITAL  Final   Special Requests   Final    BOTTLES DRAWN AEROBIC AND ANAEROBIC Blood Culture adequate volume   Culture   Final    NO GROWTH < 24 HOURS Performed at [facility], [place] [address], [place], [place] [address]    Report Status PENDING  Incomplete  Resp Panel by RT-PCR (Flu A&B, Covid) Nasopharyngeal Swab     Status: None   Collection Time: 10/08/20 11:23 AM   Specimen: Nasopharyngeal Swab; Nasopharyngeal(NP) swabs in vial transport medium  Result Value Ref Range Status   SARS Coronavirus 2 by RT PCR NEGATIVE NEGATIVE Final    Comment: (NOTE) SARS-CoV-2 target nucleic acids are NOT DETECTED.  The SARS-CoV-2 RNA is generally detectable in upper respiratory specimens during the acute phase of infection. The lowest concentration of SARS-CoV-2 viral copies this assay can  detect is 138 copies/mL.  A negative result does not preclude SARS-Cov-2 infection and should not be used as the sole basis for treatment or other patient management decisions. A negative result may occur with  improper specimen collection/handling, submission of specimen other than nasopharyngeal swab, presence of viral mutation(s) within the areas targeted by this assay, and inadequate number of viral copies(<138 copies/mL). A negative result must be combined with clinical observations, patient history, and epidemiological information. The expected result is Negative.  Fact Sheet for Patients:  EntrepreneurPulse.com.au  Fact Sheet for Healthcare Providers:  IncredibleEmployment.be  This test is no t yet approved or cleared by the [place] FDA and  has been authorized for detection and/or diagnosis of SARS-CoV-2 by FDA under an Emergency Use Authorization (EUA). This EUA will remain  in effect (meaning this test can be used) for the duration of the COVID-19 declaration under Section 564(b)(1) of the Act, 21 U.S.C.section 360bbb-3(b)(1), unless the authorization is terminated  or revoked sooner.       Influenza A by PCR NEGATIVE NEGATIVE Final   Influenza B by PCR NEGATIVE NEGATIVE Final    Comment: (NOTE) The Xpert Xpress SARS-CoV-2/FLU/RSV plus assay is intended as an aid in the diagnosis of influenza from Nasopharyngeal swab specimens and should not be used as a sole basis for treatment. Nasal washings and aspirates are unacceptable for Xpert Xpress SARS-CoV-2/FLU/RSV testing.  Fact Sheet for Patients: EntrepreneurPulse.com.au  Fact Sheet for Healthcare Providers: IncredibleEmployment.be  This test is not yet approved or cleared by the [place] FDA and has been authorized for detection and/or diagnosis of SARS-CoV-2 by FDA under an Emergency Use Authorization (EUA). This EUA will remain in  effect (meaning this test can be used) for the duration of the COVID-19 declaration under Section 564(b)(1) of the Act, 21 U.S.C. section 360bbb-3(b)(1), unless the authorization is terminated or revoked.  Performed at [facility], [place] [address] [place], [place] [address]      Medications:   . amiodarone  200 mg Oral Once per day on Mon Tue Wed Thu Fri Sat  . apixaban  2.5 mg Oral BID  . atorvastatin  20 mg Oral QHS  . diclofenac Sodium  1 application Topical QID  . docusate sodium  100 mg Oral Daily  . doxycycline  100 mg Oral Q12H  . famotidine  40 mg Oral Daily  . furosemide  40 mg Intravenous Q12H  . guaiFENesin  600 mg Oral BID  . insulin aspart  0-9 Units Subcutaneous TID WC  . insulin glargine  15 Units Subcutaneous QHS  . metoprolol succinate  25 mg Oral Daily  . mirtazapine  7.5 mg Oral QHS  . oxybutynin  5 mg Oral BID  . pregabalin  75 mg Oral QHS  . sodium chloride flush  3 mL Intravenous Q12H   Continuous Infusions: . sodium chloride    . cefTRIAXone (ROCEPHIN)  IV        LOS: 2 days   Charlynne Cousins  [facility]  10/10/2020, 9:21 AM

## 2020-10-11 LAB — GLUCOSE, CAPILLARY
Glucose-Capillary: 110 mg/dL — ABNORMAL HIGH (ref 70–99)
Glucose-Capillary: 174 mg/dL — ABNORMAL HIGH (ref 70–99)
Glucose-Capillary: 154 mg/dL — ABNORMAL HIGH (ref 70–99)
Glucose-Capillary: 195 mg/dL — ABNORMAL HIGH (ref 70–99)

## 2020-10-11 LAB — BASIC METABOLIC PANEL
Anion gap: 9 (ref 5–15)
BUN: 22 mg/dL (ref 8–23)
CO2: 30 mmol/L (ref 22–32)
Calcium: 8.4 mg/dL — ABNORMAL LOW (ref 8.9–10.3)
Chloride: 107 mmol/L (ref 98–111)
Creatinine, Ser: 1.32 mg/dL — ABNORMAL HIGH (ref 0.44–1.00)
GFR, Estimated: 40 mL/min — ABNORMAL LOW (ref >60)
Glucose, Bld: 150 mg/dL — ABNORMAL HIGH (ref 70–99)
Potassium: 4.1 mmol/L (ref 3.5–5.1)
Sodium: 146 mmol/L — ABNORMAL HIGH (ref 135–145)

## 2020-10-11 MED ORDER — GUAIFENESIN-DM 100-10 MG/5ML PO SYRP
5.0000 mL | ORAL_SOLUTION | ORAL | Status: DC | PRN
  Administered 2020-10-11: 5 mL via ORAL
  Filled 2020-10-11: qty 5

## 2020-10-11 MED ORDER — FUROSEMIDE 10 MG/ML IJ SOLN
60.0000 mg | Freq: Two times a day (BID) | INTRAMUSCULAR | Status: DC
  Administered 2020-10-11 – 2020-10-12 (×2): 60 mg via INTRAVENOUS
  Filled 2020-10-11 (×3): qty 6

## 2020-10-11 NOTE — TOC Progression Note (Addendum)
Transition of Care [facility]) - Progression Note    Patient Details  Name: [name] MRN: [id number] Date of Birth: [birth year]  Transition of Care [facility]) CM/SW [place], [place] Phone Number: [phone] 10/11/2020, 9:06 AM  Clinical Narrative:    CSW attempted to call Dustin Flock however had to leave a voice mail.  10:00am Spoke with Soy and she stated that she does not have access to the system therefore she would need to call back if she gets access today or follow up tomorrow.  TOC team will continue to assist with discharge planning needs.  Expected Discharge Plan: Skilled Nursing Facility Barriers to Discharge: Continued Medical Work up  Expected Discharge Plan and Services Expected Discharge Plan: [place]                                               Social Determinants of Health (SDOH) Interventions    Readmission Risk Interventions Readmission Risk Prevention Plan 01/03/2019 01/02/2019  Transportation Screening (No Data) Complete  PCP or Specialist Appt within 3-5 Days - Complete  HRI or [place] - Complete  Social Work Consult for [place] Planning/Counseling - Complete  Palliative Care Screening - Not Applicable  Medication Review Press photographer) - Complete  Some recent data might be hidden

## 2020-10-11 NOTE — Plan of Care (Signed)
Patient up to the chair this evening. Denies generalized pain. Patient last BM was on prior to admission on 12/14. Currently on colace daily. Will seek additional stool softeners from providers.  Patient with nonproductive cough.   Problem: Education: Goal: Knowledge of General Education information will improve Description: Including pain rating scale, medication(s)/side effects and non-pharmacologic comfort measures 10/11/2020 1219 by Kem Kays, RN Outcome: Progressing 10/11/2020 7588 by Kem Kays, RN Outcome: Progressing   Problem: Health Behavior/Discharge Planning: Goal: Ability to manage health-related needs will improve 10/11/2020 3254 by Kem Kays, RN Outcome: Progressing 10/11/2020 9826 by Kem Kays, RN Outcome: Progressing   Problem: Clinical Measurements: Goal: Ability to maintain clinical measurements within normal limits will improve 10/11/2020 4158 by Kem Kays, RN Outcome: Progressing 10/11/2020 3094 by Kem Kays, RN Outcome: Progressing Goal: Will remain free from infection 10/11/2020 0768 by Kem Kays, RN Outcome: Progressing 10/11/2020 0881 by Kem Kays, RN Outcome: Progressing Goal: Diagnostic test results will improve 10/11/2020 1031 by Kem Kays, RN Outcome: Progressing 10/11/2020 5945 by Kem Kays, RN Outcome: Progressing Goal: Respiratory complications will improve 10/11/2020 8592 by Kem Kays, RN Outcome: Progressing 10/11/2020 9244 by Kem Kays, RN Outcome: Progressing Goal: Cardiovascular complication will be avoided 10/11/2020 6286 by Kem Kays, RN Outcome: Progressing 10/11/2020 3817 by Kem Kays, RN Outcome: Progressing   Problem: Activity: Goal: Risk for activity intolerance will decrease 10/11/2020 7116 by Kem Kays, RN Outcome: Progressing 10/11/2020 5790 by Kem Kays, RN Outcome: Progressing   Problem: Nutrition: Goal:  Adequate nutrition will be maintained 10/11/2020 3833 by Kem Kays, RN Outcome: Progressing 10/11/2020 3832 by Kem Kays, RN Outcome: Progressing   Problem: Coping: Goal: Level of anxiety will decrease 10/11/2020 9191 by Kem Kays, RN Outcome: Progressing 10/11/2020 6606 by Kem Kays, RN Outcome: Progressing   Problem: Elimination: Goal: Will not experience complications related to urinary retention 10/11/2020 0045 by Kem Kays, RN Outcome: Progressing 10/11/2020 9977 by Kem Kays, RN Outcome: Progressing   Problem: Pain Managment: Goal: General experience of comfort will improve 10/11/2020 4142 by Kem Kays, RN Outcome: Progressing 10/11/2020 3953 by Kem Kays, RN Outcome: Progressing   Problem: Safety: Goal: Ability to remain free from injury will improve 10/11/2020 2023 by Kem Kays, RN Outcome: Progressing 10/11/2020 3435 by Kem Kays, RN Outcome: Progressing   Problem: Skin Integrity: Goal: Risk for impaired skin integrity will decrease 10/11/2020 6861 by Kem Kays, RN Outcome: Progressing 10/11/2020 6837 by Kem Kays, RN Outcome: Progressing

## 2020-10-11 NOTE — Progress Notes (Signed)
[facility] PROGRESS NOTE    Progress Note  DESTYN PARFITT  XTG:[id number] DOB: 05-07-1933 DOA: 10/08/2020 PCP: [name], MD     Brief Narrative:   SHIRONDA KAIN is an 86 y.o. female past medical history of combined systolic and diastolic heart failure, essential hypertension, chronic kidney stage III with a history of CVA recurrent DVT on Eliquis left bundle branch block SVT status post ablation and a recent Covid 19 infection on 06/12/2020 comes into the hospital for shortness of breath that started 3 days prior to admission, in the ED he was found to have a temperature of 100.5 breathing 20 times per minute with a white count of 4 hemoglobin of 10 lactic acid of 1.4 chest x-ray showed new infiltrates in both lung fields influenza and SARS-CoV-2 PCR were negative, he was started on IV Lasix Rocephin and doxy  Assessment/Plan:   Acute on chronic combined systolic and diastolic CHF (congestive heart failure): Last 2D echo on September 2021 that showed an EF of 30% global hypokinesia grade 1 diastolic heart failure. Chest x-ray done in the ED showed bilateral infiltrates with a BNP of greater than 2000 ( September BNP was around 850). She has been weaned to room air. She is negative about 2 L continue to monitor electrolytes and replete as needed. Continue strict I's and O's and daily weights. Weight Is improving. Still appears fluid overloaded on physical exam. I believe we are getting closer estimated dry weight.  Her lowest weight on her chart was around 81 kg, today she weighs 83. Physical therapy evaluated the patient and recommended skilled nursing facility.  Dysphagia: Speech evaluated the patient and recommended a dysphagia 2 diet. She has remained afebrile no leukocytosis discontinue antibiotics.  Elevated troponins: High sensitive troponins have basically remained flat there is likely demand ischemia in the setting of heart failure.  History of recurrent  DVT: Continue Eliquis.  History of SVT status post pacemaker: Continue amiodarone 200 mg daily. Pacer to be interrogated.  Essential hypertension: Continue metoprolol, blood pressure is relatively controlled.  Chronic kidney disease stage IIIb: Creatinine appears to be at baseline.  Diabetes mellitus type 2: With an A1c of 7.5 continue current regimen blood glucose seems relatively well controlled.  CVA: Continue statins and aspirin.  Anemia of chronic disease: Hemoglobin appears to be at baseline.  Chronic thrombocytopenia: No signs of bleeding, platelets are greater than 100.  History of breast cancer: Continue current regimen follow-up with oncology as an outpatient.   DVT prophylaxis: Eliquis Family Communication:none Status is: Inpatient  Remains inpatient appropriate because:Hemodynamically unstable   Dispo: The patient is from: SNF              Anticipated d/c is to: SNF              Anticipated d/c date is: 2 days              Patient currently is not medically stable to d/c.    Code Status:     Code Status Orders  (From admission, onward)         Start     Ordered   10/08/20 1340  Full code  Continuous        10/08/20 1339        Code Status History    Date Active Date Inactive Code Status Order ID Comments User Context   07/16/2020 2000 07/21/2020 0437 DNR [id number]  Pershing Proud, NP Inpatient   07/13/2020 2011 07/16/2020  2000 Full Code [id number]  Toy Baker, MD ED   06/22/2020 2359 07/01/2020 1904 Full Code [id number]  Elwyn Reach, MD ED   08/10/2019 1954 08/22/2019 2119 Full Code [id number]  Etta Quill, DO ED   06/08/2019 2038 06/11/2019 2239 Full Code [id number]  Neena Rhymes, MD ED   11/06/2018 2300 11/09/2018 2152 Full Code [id number]  Rise Patience, MD Inpatient   Advance Care Planning Activity    Advance Directive Documentation   Flowsheet Row Most Recent Value  Type of Advance Directive Healthcare Power of  Attorney  Pre-existing out of facility DNR order (yellow form or pink MOST form) --  "MOST" Form in Place? --        IV Access:    Peripheral IV   Procedures and diagnostic studies:   No results found.   Medical Consultants:    None.  Anti-Infectives:   none  Subjective:    Lillan F [place] of the oxygen this morning she relates she feels better than yesterday.  Objective:    Vitals:   10/10/20 2200 10/11/20 0242 10/11/20 0420 10/11/20 0930  BP: 124/71  128/73 130/76  Pulse: 60  (!) 59 60  Resp: 18  16 20   Temp: 97.6 F (36.4 C)  (!) 97.5 F (36.4 C)   TempSrc: Oral  Oral   SpO2: 100%  100% 100%  Weight:  85.8 kg 82.8 kg   Height:       SpO2: 100 % O2 Flow Rate (L/min): 2 L/min   Intake/Output Summary (Last 24 hours) at 10/11/2020 1021 Last data filed at 10/11/2020 0926 Gross per 24 hour  Intake 363 ml  Output 1200 ml  Net -837 ml   Filed Weights   10/10/20 0204 10/11/20 0242 10/11/20 0420  Weight: 84.3 kg 85.8 kg 82.8 kg    Exam: General exam: In no acute distress. Respiratory system: Good air movement and clear to auscultation. Cardiovascular system: S1 & S2 heard, RRR.  Neck veins are still up Gastrointestinal system: Abdomen is nondistended, soft and nontender.  Extremities: No pedal edema. Skin: No rashes, lesions or ulcers Psychiatry: Judgement and insight appear normal. Mood & affect appropriate.  Data Reviewed:    Labs: Basic Metabolic Panel: Recent Labs  Lab 10/08/20 1202 10/08/20 1204 10/09/20 0313 10/10/20 0347 10/11/20 0129  NA 146* 144 143 146* 146*  K 3.6 3.7 3.3* 3.3* 4.1  CL  --  102 104 106 107  CO2  --  27 27 31 30   GLUCOSE  --  177* 190* 127* 150*  BUN  --  19 18 18 22   CREATININE  --  1.49* 1.35* 1.35* 1.32*  CALCIUM  --  9.1 8.7* 8.5* 8.4*   GFR Estimated Creatinine Clearance: 33.1 mL/min (A) (by C-G formula based on SCr of 1.32 mg/dL (H)). Liver Function Tests: Recent Labs  Lab 10/08/20 1204  AST 38   ALT 56*  ALKPHOS 95  BILITOT 0.9  PROT 7.4  ALBUMIN 3.7   No results for input(s): LIPASE, AMYLASE in the last 168 hours. No results for input(s): AMMONIA in the last 168 hours. Coagulation profile Recent Labs  Lab 10/08/20 1204  INR 1.4*   COVID-19 Labs  No results for input(s): DDIMER, FERRITIN, LDH, CRP in the last 72 hours.  Lab Results  Component Value Date   SARSCOV2NAA NEGATIVE 10/08/2020   SARSCOV2NAA NEGATIVE 07/20/2020   SARSCOV2NAA NEGATIVE 07/17/2020   SARSCOV2NAA POSITIVE (A) 07/13/2020    CBC: Recent Labs  Lab 10/08/20 1202 10/08/20 1204  WBC  --  4.0  NEUTROABS  --  2.3  HGB 10.5* 10.7*  HCT 31.0* 35.7*  MCV  --  98.6  PLT  --  113*   Cardiac Enzymes: No results for input(s): CKTOTAL, CKMB, CKMBINDEX, TROPONINI in the last 168 hours. BNP (last 3 results) No results for input(s): PROBNP in the last 8760 hours. CBG: Recent Labs  Lab 10/10/20 0758 10/10/20 1200 10/10/20 1609 10/10/20 2209 10/11/20 0610  GLUCAP 83 101* 200* 190* 110*   D-Dimer: No results for input(s): DDIMER in the last 72 hours. Hgb A1c: Recent Labs    10/08/20 1121  HGBA1C 7.5*   Lipid Profile: No results for input(s): CHOL, HDL, LDLCALC, TRIG, CHOLHDL, LDLDIRECT in the last 72 hours. Thyroid function studies: No results for input(s): TSH, T4TOTAL, T3FREE, THYROIDAB in the last 72 hours.  Invalid input(s): FREET3 Anemia work up: No results for input(s): VITAMINB12, FOLATE, FERRITIN, TIBC, IRON, RETICCTPCT in the last 72 hours. Sepsis Labs: Recent Labs  Lab 10/08/20 1202 10/08/20 1204 10/08/20 1321  PROCALCITON  --   --  <0.10  WBC  --  4.0  --   LATICACIDVEN 1.4  --  1.6   Microbiology Recent Results (from the past 240 hour(s))  Urine culture     Status: Abnormal   Collection Time: 10/08/20  3:43 AM   Specimen: In/Out Cath Urine  Result Value Ref Range Status   Specimen Description IN/OUT CATH URINE  Final   Special Requests   Final     NONE Performed at [facility], [place] [address] [place], [place] [address]    Culture MULTIPLE SPECIES PRESENT, SUGGEST RECOLLECTION (A)  Final   Report Status 10/10/2020 FINAL  Final  Blood culture (routine single)     Status: None (Preliminary result)   Collection Time: 10/08/20 11:21 AM   Specimen: BLOOD  Result Value Ref Range Status   Specimen Description BLOOD RIGHT ANTECUBITAL  Final   Special Requests   Final    BOTTLES DRAWN AEROBIC AND ANAEROBIC Blood Culture adequate volume   Culture   Final    NO GROWTH 2 DAYS Performed at [facility], [place] [address] [place], [place] [address]    Report Status PENDING  Incomplete  Resp Panel by RT-PCR (Flu A&B, Covid) Nasopharyngeal Swab     Status: None   Collection Time: 10/08/20 11:23 AM   Specimen: Nasopharyngeal Swab; Nasopharyngeal(NP) swabs in vial transport medium  Result Value Ref Range Status   SARS Coronavirus 2 by RT PCR NEGATIVE NEGATIVE Final    Comment: (NOTE) SARS-CoV-2 target nucleic acids are NOT DETECTED.  The SARS-CoV-2 RNA is generally detectable in upper respiratory specimens during the acute phase of infection. The lowest concentration of SARS-CoV-2 viral copies this assay can detect is 138 copies/mL. A negative result does not preclude SARS-Cov-2 infection and should not be used as the sole basis for treatment or other patient management decisions. A negative result may occur with  improper specimen collection/handling, submission of specimen other than nasopharyngeal swab, presence of viral mutation(s) within the areas targeted by this assay, and inadequate number of viral copies(<138 copies/mL). A negative result must be combined with clinical observations, patient history, and epidemiological information. The expected result is Negative.  Fact Sheet for Patients:  EntrepreneurPulse.com.au  Fact Sheet for Healthcare Providers:   IncredibleEmployment.be  This test is no t yet approved or cleared by the [place] FDA and  has  been authorized for detection and/or diagnosis of SARS-CoV-2 by FDA under an Emergency Use Authorization (EUA). This EUA will remain  in effect (meaning this test can be used) for the duration of the COVID-19 declaration under Section 564(b)(1) of the Act, 21 U.S.C.section 360bbb-3(b)(1), unless the authorization is terminated  or revoked sooner.       Influenza A by PCR NEGATIVE NEGATIVE Final   Influenza B by PCR NEGATIVE NEGATIVE Final    Comment: (NOTE) The Xpert Xpress SARS-CoV-2/FLU/RSV plus assay is intended as an aid in the diagnosis of influenza from Nasopharyngeal swab specimens and should not be used as a sole basis for treatment. Nasal washings and aspirates are unacceptable for Xpert Xpress SARS-CoV-2/FLU/RSV testing.  Fact Sheet for Patients: EntrepreneurPulse.com.au  Fact Sheet for Healthcare Providers: IncredibleEmployment.be  This test is not yet approved or cleared by the [place] FDA and has been authorized for detection and/or diagnosis of SARS-CoV-2 by FDA under an Emergency Use Authorization (EUA). This EUA will remain in effect (meaning this test can be used) for the duration of the COVID-19 declaration under Section 564(b)(1) of the Act, 21 U.S.C. section 360bbb-3(b)(1), unless the authorization is terminated or revoked.  Performed at [facility], [place] [address] [place], [place] [address]      Medications:   . amiodarone  200 mg Oral Once per day on Mon Tue Wed Thu Fri Sat  . apixaban  2.5 mg Oral BID  . atorvastatin  20 mg Oral QHS  . diclofenac Sodium  1 application Topical QID  . docusate sodium  100 mg Oral Daily  . doxycycline  100 mg Oral Q12H  . famotidine  40 mg Oral Daily  . furosemide  40 mg Intravenous Q12H  . guaiFENesin  600 mg Oral BID  . insulin aspart  0-9  Units Subcutaneous TID WC  . insulin glargine  15 Units Subcutaneous QHS  . lisinopril  5 mg Oral Daily  . metoprolol succinate  25 mg Oral Daily  . mirtazapine  7.5 mg Oral QHS  . oxybutynin  5 mg Oral BID  . pregabalin  75 mg Oral QHS  . sodium chloride flush  3 mL Intravenous Q12H   Continuous Infusions: . sodium chloride        LOS: 3 days   Charlynne Cousins  [facility]  10/11/2020, 10:21 AM

## 2020-10-12 LAB — GLUCOSE, CAPILLARY
Glucose-Capillary: 101 mg/dL — ABNORMAL HIGH (ref 70–99)
Glucose-Capillary: 102 mg/dL — ABNORMAL HIGH (ref 70–99)
Glucose-Capillary: 180 mg/dL — ABNORMAL HIGH (ref 70–99)
Glucose-Capillary: 206 mg/dL — ABNORMAL HIGH (ref 70–99)
Glucose-Capillary: 175 mg/dL — ABNORMAL HIGH (ref 70–99)

## 2020-10-12 LAB — BASIC METABOLIC PANEL
Anion gap: 10 (ref 5–15)
BUN: 19 mg/dL (ref 8–23)
CO2: 31 mmol/L (ref 22–32)
Calcium: 8.6 mg/dL — ABNORMAL LOW (ref 8.9–10.3)
Chloride: 104 mmol/L (ref 98–111)
Creatinine, Ser: 1.27 mg/dL — ABNORMAL HIGH (ref 0.44–1.00)
GFR, Estimated: 41 mL/min — ABNORMAL LOW (ref >60)
Glucose, Bld: 120 mg/dL — ABNORMAL HIGH (ref 70–99)
Potassium: 3.3 mmol/L — ABNORMAL LOW (ref 3.5–5.1)
Sodium: 145 mmol/L (ref 135–145)

## 2020-10-12 MED ORDER — MAGNESIUM OXIDE 400 (241.3 MG) MG PO TABS
400.0000 mg | ORAL_TABLET | Freq: Two times a day (BID) | ORAL | Status: AC
  Administered 2020-10-12 (×2): 400 mg via ORAL
  Filled 2020-10-12 (×2): qty 1

## 2020-10-12 MED ORDER — FUROSEMIDE 10 MG/ML IJ SOLN
80.0000 mg | Freq: Two times a day (BID) | INTRAMUSCULAR | Status: AC
  Administered 2020-10-12 – 2020-10-13 (×2): 80 mg via INTRAVENOUS
  Filled 2020-10-12 (×2): qty 8

## 2020-10-12 MED ORDER — POTASSIUM CHLORIDE CRYS ER 20 MEQ PO TBCR
40.0000 meq | EXTENDED_RELEASE_TABLET | Freq: Two times a day (BID) | ORAL | Status: AC
  Administered 2020-10-12 (×2): 40 meq via ORAL
  Filled 2020-10-12 (×2): qty 2

## 2020-10-12 NOTE — Progress Notes (Signed)
[facility] PROGRESS NOTE    Progress Note  [name]  HBZ:[id number] DOB: January 19, 1933 DOA: 10/08/2020 PCP: [name], MD     Brief Narrative:   [name] is an 86 y.o. female past medical history of combined systolic and diastolic heart failure, essential hypertension, chronic kidney stage III with a history of CVA recurrent DVT on Eliquis left bundle branch block SVT status post ablation and a recent Covid 19 infection on 06/12/2020 comes into the hospital for shortness of breath that started 3 days prior to admission, in the ED he was found to have a temperature of 100.5 breathing 20 times per minute with a white count of 4 hemoglobin of 10 lactic acid of 1.4 chest x-ray showed new infiltrates in both lung fields influenza and SARS-CoV-2 PCR were negative, he was started on IV Lasix Rocephin and doxy.  Empiric antibiotics were discontinued she was started on aggressive IV diuresis she is diuresed about 3 L and her breathing is improving along with her weight which is coming down. She has been weaned to room air. We are continuing diuresis.  Assessment/Plan:   Acute on chronic combined systolic and diastolic CHF (congestive heart failure): Last 2D echo on September 2021 that showed an EF of 30% global hypokinesia grade 1 diastolic heart failure. Chest x-ray done in the ED showed bilateral infiltrates with a BNP of greater than 2000 ( September BNP was around 850). Negative about 3 L, continue IV Lasix monitor electrolytes and replete as needed. Continue strict I's and O's and daily weight. Her weight is improved nicely. She is still appears fluid overloaded on physical exam Kilograms, her estimated dry weight is around 81. Physical therapy evaluated the patient and recommended skilled nursing facility.  Dysphagia: Speech evaluated the patient and recommended a dysphagia 2 diet. She has remained afebrile no leukocytosis discontinue antibiotics.  Elevated  troponins: High sensitive troponins have basically remained flat there is likely demand ischemia in the setting of heart failure.  History of recurrent DVT: Continue Eliquis.  History of SVT status post pacemaker: Continue amiodarone 200 mg daily. Pacer to be interrogated.  Essential hypertension: Continue metoprolol, blood pressure is relatively controlled.  Chronic kidney disease stage IIIb: Creatinine appears to be at baseline.  Diabetes mellitus type 2: With an A1c of 7.5 continue current regimen blood glucose seems relatively well controlled.  CVA: Continue statins and aspirin.  Anemia of chronic disease: Hemoglobin appears to be at baseline.  Chronic thrombocytopenia: No signs of bleeding, platelets are greater than 100.  History of breast cancer: Continue current regimen follow-up with oncology as an outpatient.   DVT prophylaxis: Eliquis Family Communication:none Status is: Inpatient  Remains inpatient appropriate because:Hemodynamically unstable   Dispo: The patient is from: SNF              Anticipated d/c is to: SNF              Anticipated d/c date is: 2 days              Patient currently is not medically stable to d/c.    Code Status:     Code Status Orders  (From admission, onward)         Start     Ordered   10/08/20 1340  Full code  Continuous        10/08/20 1339        Code Status History    Date Active Date Inactive Code Status Order ID  Comments User Context   07/16/2020 2000 07/21/2020 0437 DNR [id number]  Pershing Proud, NP Inpatient   07/13/2020 2011 07/16/2020 2000 Full Code [id number]  Toy Baker, MD ED   06/22/2020 2359 07/01/2020 1904 Full Code [id number]  Elwyn Reach, MD ED   08/10/2019 1954 08/22/2019 2119 Full Code [id number]  Etta Quill, DO ED   06/08/2019 2038 06/11/2019 2239 Full Code [id number]  Neena Rhymes, MD ED   11/06/2018 2300 11/09/2018 2152 Full Code [id number]  Rise Patience, MD Inpatient    Advance Care Planning Activity    Advance Directive Documentation   Flowsheet Row Most Recent Value  Type of Advance Directive Healthcare Power of Attorney  Pre-existing out of facility DNR order (yellow form or pink MOST form) --  "MOST" Form in Place? --        IV Access:    Peripheral IV   Procedures and diagnostic studies:   No results found.   Medical Consultants:    None.  Anti-Infectives:   none  Subjective:    [name] has no new complaints this morning.  Objective:    Vitals:   10/11/20 1142 10/11/20 2018 10/12/20 0428 10/12/20 0828  BP: 121/70 (!) 133/94  131/72  Pulse: (!) 59 (!) 59  60  Resp: 20 18  16   Temp: 98.3 F (36.8 C) 98.6 F (37 C)    TempSrc: Oral Oral    SpO2: 91% 97%  94%  Weight:   82.9 kg   Height:       SpO2: 94 % O2 Flow Rate (L/min): 2 L/min   Intake/Output Summary (Last 24 hours) at 10/12/2020 0956 Last data filed at 10/12/2020 0830 Gross per 24 hour  Intake 240 ml  Output 400 ml  Net -160 ml   Filed Weights   10/11/20 0242 10/11/20 0420 10/12/20 0428  Weight: 85.8 kg 82.8 kg 82.9 kg    Exam: General exam: In no acute distress. Respiratory system: Good air movement and clear to auscultation. Cardiovascular system: S1 & S2 heard, RRR.  Positive JVD Gastrointestinal system: Abdomen is nondistended, soft and nontender.  Extremities: No pedal edema. Skin: No rashes, lesions or ulcers Psychiatry: Judgement and insight appear normal. Mood & affect appropriate.  Data Reviewed:    Labs: Basic Metabolic Panel: Recent Labs  Lab 10/08/20 1204 10/09/20 0313 10/10/20 0347 10/11/20 0129 10/12/20 0348  NA 144 143 146* 146* 145  K 3.7 3.3* 3.3* 4.1 3.3*  CL 102 104 106 107 104  CO2 27 27 31 30 31   GLUCOSE 177* 190* 127* 150* 120*  BUN 19 18 18 22 19   CREATININE 1.49* 1.35* 1.35* 1.32* 1.27*  CALCIUM 9.1 8.7* 8.5* 8.4* 8.6*   GFR Estimated Creatinine Clearance: 34.5 mL/min (A) (by C-G formula based  on SCr of 1.27 mg/dL (H)). Liver Function Tests: Recent Labs  Lab 10/08/20 1204  AST 38  ALT 56*  ALKPHOS 95  BILITOT 0.9  PROT 7.4  ALBUMIN 3.7   No results for input(s): LIPASE, AMYLASE in the last 168 hours. No results for input(s): AMMONIA in the last 168 hours. Coagulation profile Recent Labs  Lab 10/08/20 1204  INR 1.4*   COVID-19 Labs  No results for input(s): DDIMER, FERRITIN, LDH, CRP in the last 72 hours.  Lab Results  Component Value Date   SARSCOV2NAA NEGATIVE 10/08/2020   SARSCOV2NAA NEGATIVE 07/20/2020   SARSCOV2NAA NEGATIVE 07/17/2020   SARSCOV2NAA POSITIVE (A) 07/13/2020    CBC: Recent Labs  Lab 10/08/20 1202 10/08/20 1204  WBC  --  4.0  NEUTROABS  --  2.3  HGB 10.5* 10.7*  HCT 31.0* 35.7*  MCV  --  98.6  PLT  --  113*   Cardiac Enzymes: No results for input(s): CKTOTAL, CKMB, CKMBINDEX, TROPONINI in the last 168 hours. BNP (last 3 results) No results for input(s): PROBNP in the last 8760 hours. CBG: Recent Labs  Lab 10/11/20 1135 10/11/20 1653 10/11/20 2114 10/12/20 0554 10/12/20 0811  GLUCAP 174* 154* 195* 101* 102*   D-Dimer: No results for input(s): DDIMER in the last 72 hours. Hgb A1c: No results for input(s): HGBA1C in the last 72 hours. Lipid Profile: No results for input(s): CHOL, HDL, LDLCALC, TRIG, CHOLHDL, LDLDIRECT in the last 72 hours. Thyroid function studies: No results for input(s): TSH, T4TOTAL, T3FREE, THYROIDAB in the last 72 hours.  Invalid input(s): FREET3 Anemia work up: No results for input(s): VITAMINB12, FOLATE, FERRITIN, TIBC, IRON, RETICCTPCT in the last 72 hours. Sepsis Labs: Recent Labs  Lab 10/08/20 1202 10/08/20 1204 10/08/20 1321  PROCALCITON  --   --  <0.10  WBC  --  4.0  --   LATICACIDVEN 1.4  --  1.6   Microbiology Recent Results (from the past 240 hour(s))  Urine culture     Status: Abnormal   Collection Time: 10/08/20  3:43 AM   Specimen: In/Out Cath Urine  Result Value Ref Range  Status   Specimen Description IN/OUT CATH URINE  Final   Special Requests   Final    NONE Performed at [facility], [place] [address] [place], [place] [address]    Culture MULTIPLE SPECIES PRESENT, SUGGEST RECOLLECTION (A)  Final   Report Status 10/10/2020 FINAL  Final  Blood culture (routine single)     Status: None (Preliminary result)   Collection Time: 10/08/20 11:21 AM   Specimen: BLOOD  Result Value Ref Range Status   Specimen Description BLOOD RIGHT ANTECUBITAL  Final   Special Requests   Final    BOTTLES DRAWN AEROBIC AND ANAEROBIC Blood Culture adequate volume   Culture   Final    NO GROWTH 4 DAYS Performed at [facility], [place] [address], [place], [place] [address]    Report Status PENDING  Incomplete  Resp Panel by RT-PCR (Flu A&B, Covid) Nasopharyngeal Swab     Status: None   Collection Time: 10/08/20 11:23 AM   Specimen: Nasopharyngeal Swab; Nasopharyngeal(NP) swabs in vial transport medium  Result Value Ref Range Status   SARS Coronavirus 2 by RT PCR NEGATIVE NEGATIVE Final    Comment: (NOTE) SARS-CoV-2 target nucleic acids are NOT DETECTED.  The SARS-CoV-2 RNA is generally detectable in upper respiratory specimens during the acute phase of infection. The lowest concentration of SARS-CoV-2 viral copies this assay can detect is 138 copies/mL. A negative result does not preclude SARS-Cov-2 infection and should not be used as the sole basis for treatment or other patient management decisions. A negative result may occur with  improper specimen collection/handling, submission of specimen other than nasopharyngeal swab, presence of viral mutation(s) within the areas targeted by this assay, and inadequate number of viral copies(<138 copies/mL). A negative result must be combined with clinical observations, patient history, and epidemiological information. The expected result is Negative.  Fact Sheet for Patients:   EntrepreneurPulse.com.au  Fact Sheet for Healthcare Providers:  IncredibleEmployment.be  This test is no t yet approved or cleared by the [place] and  has been authorized  for detection and/or diagnosis of SARS-CoV-2 by FDA under an Emergency Use Authorization (EUA). This EUA will remain  in effect (meaning this test can be used) for the duration of the COVID-19 declaration under Section 564(b)(1) of the Act, 21 U.S.C.section 360bbb-3(b)(1), unless the authorization is terminated  or revoked sooner.       Influenza A by PCR NEGATIVE NEGATIVE Final   Influenza B by PCR NEGATIVE NEGATIVE Final    Comment: (NOTE) The Xpert Xpress SARS-CoV-2/FLU/RSV plus assay is intended as an aid in the diagnosis of influenza from Nasopharyngeal swab specimens and should not be used as a sole basis for treatment. Nasal washings and aspirates are unacceptable for Xpert Xpress SARS-CoV-2/FLU/RSV testing.  Fact Sheet for Patients: EntrepreneurPulse.com.au  Fact Sheet for Healthcare Providers: IncredibleEmployment.be  This test is not yet approved or cleared by the [place] FDA and has been authorized for detection and/or diagnosis of SARS-CoV-2 by FDA under an Emergency Use Authorization (EUA). This EUA will remain in effect (meaning this test can be used) for the duration of the COVID-19 declaration under Section 564(b)(1) of the Act, 21 U.S.C. section 360bbb-3(b)(1), unless the authorization is terminated or revoked.  Performed at [facility], [place] [address] [place], [place] [address]      Medications:   . amiodarone  200 mg Oral Once per day on Mon Tue Wed Thu Fri Sat  . apixaban  2.5 mg Oral BID  . atorvastatin  20 mg Oral QHS  . diclofenac Sodium  1 application Topical QID  . docusate sodium  100 mg Oral Daily  . doxycycline  100 mg Oral Q12H  . famotidine  40 mg Oral Daily  . furosemide   60 mg Intravenous Q12H  . guaiFENesin  600 mg Oral BID  . insulin aspart  0-9 Units Subcutaneous TID WC  . insulin glargine  15 Units Subcutaneous QHS  . lisinopril  5 mg Oral Daily  . magnesium oxide  400 mg Oral BID  . metoprolol succinate  25 mg Oral Daily  . mirtazapine  7.5 mg Oral QHS  . oxybutynin  5 mg Oral BID  . potassium chloride  40 mEq Oral BID  . pregabalin  75 mg Oral QHS  . sodium chloride flush  3 mL Intravenous Q12H   Continuous Infusions: . sodium chloride        LOS: 4 days   Charlynne Cousins  [facility]  10/12/2020, 9:56 AM

## 2020-10-12 NOTE — TOC Progression Note (Signed)
Transition of Care [facility]) - Progression Note    Patient Details  Name: [name] MRN: [id number] Date of Birth: 1932/12/27  Transition of Care [facility] CM/SW [place], [place] Phone Number: 10/12/2020, 3:04 PM  Clinical Narrative:     CSW called S[facility] at [facility] requesting call back. CSW missed call back; Soy left message.   CSW called [facility] again and left messaging notifying of pt's likely discharge in a few days. CSW to start auth prior to d/c.   Expected Discharge Plan: Skilled Nursing Facility Barriers to Discharge: Continued Medical Work up  Expected Discharge Plan and Services Expected Discharge Plan: [place]                                               Social Determinants of Health (SDOH) Interventions    Readmission Risk Interventions Readmission Risk Prevention Plan 01/03/2019 01/02/2019  Transportation Screening (No Data) Complete  PCP or Specialist Appt within 3-5 Days - Complete  HRI or [place] - Complete  Social Work Consult for [place] Planning/Counseling - Complete  Palliative Care Screening - Not Applicable  Medication Review Press photographer) - Complete  Some recent data might be hidden

## 2020-10-12 NOTE — Progress Notes (Signed)
  Mobility Specialist Criteria Algorithm Info.   Mobility Team: [facility] elevated:HOB greater than 30 Activity: Ambulated in room; Transferred:  Bed to chair Range of motion: Active; All extremities Level of assistance: Moderate assist, patient does 50-74% Assistive device: Front wheel walker Minutes sitting in chair:  Minutes stood: 2 minutes Minutes ambulated: 1 minutes Distance ambulated (ft): 2 ft Mobility response: Tolerated Well Bed Position: Chair (Recliner)  Pt agreed to participate in mobility this morning. Required Mod A to EOB supine>sit and Mod A sit/stand from elevated surface. Pivoted to chair with RW, requiring cues for safety. Tolerated transfer well without any complaints and is now sitting in recliner chair with all needs met.   10/12/2020 11:07 AM

## 2020-10-12 NOTE — NC FL2 (Signed)
[place] MEDICAID FL2 LEVEL OF CARE SCREENING TOOL     IDENTIFICATION  Patient Name: [name] Birthdate: Mar 07, 1933 Sex: female Admission Date (Current Location): 10/08/2020  [facility] and [place] Number:  Herbalist and Address:  [facility] [place], [place] [address] [place], [place] [address]      Provider Number: [id number]  Attending Physician Name and Address:  Charlynne Cousins, MD  Relative Name and Phone Number:  [name] (Daughter)   [phone] [facility])    Current Level of Care: Hospital Recommended Level of Care: [place] Prior Approval Number:    Date Approved/Denied:   PASRR Number: [id number]  Discharge Plan: SNF    Current Diagnoses: Patient Active Problem List   Diagnosis Date Noted  . Acute on chronic combined systolic and diastolic CHF (congestive heart failure) ([place]) 10/08/2020  . GERD (gastroesophageal reflux disease) 10/08/2020  . Declining functional status   . Goals of care, counseling/discussion   . Palliative care by specialist   . Aspiration pneumonia ([place]) 07/13/2020  . Hypoglycemia 07/13/2020  . Elevated troponin 07/13/2020  . Sepsis ([place]) 07/13/2020  . Syncope and collapse 06/22/2020  . COVID-19 virus infection 06/22/2020  . Hypokalemia 06/22/2020  . Pacemaker 05/06/2020  . Syncope 08/10/2019  . Displaced fracture of lateral malleolus of right fibula, initial encounter for closed fracture 07/10/2019  . Pain in left shoulder 07/10/2019  . Acute CHF (congestive heart failure) ([place]) 06/09/2019  . Acute pulmonary edema (HCC)   . Personal history of breast cancer 04/22/2019  . Pressure ulcer 01/04/2019  . Acute pancreatitis 01/02/2019  . Malignant neoplasm of lower-inner quadrant of right breast of female, estrogen receptor positive ([place]) 11/23/2018  . Hematoma of right iliopsoas muscle 11/06/2018  . Intramuscular hematoma 11/06/2018  . Hypertensive heart disease with heart  failure ([place]) 09/05/2017  . Aphasia 08/31/2017  . Dysphagia 08/31/2017  . CKD (chronic kidney disease), stage III ([place])   . LBBB (left bundle branch block)   . Anemia   . HTN (hypertension)   . Obesity   . DVT (deep venous thrombosis) ([place])   . DM (diabetes mellitus) ([place]) 07/28/2015  . Fall 07/28/2015  . Warfarin-induced coagulopathy ([place]) 07/28/2015  . Acute on chronic diastolic CHF (congestive heart failure) ([place]) 03/19/2014  . Edema 08/06/2013  . SVT (supraventricular tachycardia) ([place]) 10/15/2012  . First degree AV block 10/15/2012  . Acute renal insufficiency 10/14/2012  . Dizziness and giddiness   . Thrombocytopenia ([place]) 11/21/2011    Orientation RESPIRATION BLADDER Height & Weight     Self,Time,Situation,Place  Normal External catheter Weight: 182 lb 12.2 oz (82.9 kg) Height:  5\' 5"  (165.1 cm)  BEHAVIORAL SYMPTOMS/MOOD NEUROLOGICAL BOWEL NUTRITION STATUS      Continent Diet (See d/c summary)  AMBULATORY STATUS COMMUNICATION OF NEEDS Skin   Extensive Assist Verbally Normal                       Personal Care Assistance Level of Assistance  Bathing,Feeding,Dressing Bathing Assistance: Limited assistance Feeding assistance: Independent Dressing Assistance: Limited assistance     Functional Limitations Info  Sight,Hearing,Speech Sight Info: Impaired Hearing Info: Impaired Speech Info: Adequate    SPECIAL CARE FACTORS FREQUENCY  PT (By licensed PT),OT (By licensed OT)     PT Frequency: 5x/week OT Frequency: 5x/week            Contractures Contractures Info: Not present    Additional Factors Info  Code Status,Allergies,Insulin Sliding Scale  Code Status Info: Full Allergies Info: Aspirin, penecillins   Insulin Sliding Scale Info: insulin aspart (novoLOG) injection 0-9 Units       Current Medications (10/12/2020):  This is the current hospital active medication list Current Facility-Administered Medications  Medication Dose Route Frequency  Provider Last Rate Last Admin  . 0.9 %  sodium chloride infusion  250 mL Intravenous PRN Opyd, Ilene Qua, MD      . acetaminophen (TYLENOL) tablet 650 mg  650 mg Oral Q4H PRN Opyd, Ilene Qua, MD      . amiodarone (PACERONE) tablet 200 mg  200 mg Oral Once per day on Mon Tue Wed Thu Fri Sat Fuller Plan A, MD   200 mg at 10/12/20 0841  . apixaban (ELIQUIS) tablet 2.5 mg  2.5 mg Oral BID Fuller Plan A, MD   2.5 mg at 10/12/20 0831  . atorvastatin (LIPITOR) tablet 20 mg  20 mg Oral QHS Smith, Rondell A, MD   20 mg at 10/11/20 2106  . diclofenac Sodium (VOLTAREN) 1 % topical gel 1 application  1 application Topical QID Norval Morton, MD   1 application at 53/66/44 0827  . docusate sodium (COLACE) capsule 100 mg  100 mg Oral Daily Tamala Julian, Rondell A, MD   100 mg at 10/11/20 0927  . doxycycline (VIBRA-TABS) tablet 100 mg  100 mg Oral Q12H Opyd, Ilene Qua, MD   100 mg at 10/12/20 0831  . famotidine (PEPCID) tablet 40 mg  40 mg Oral Daily Smith, Rondell A, MD   40 mg at 10/12/20 0831  . furosemide (LASIX) injection 80 mg  80 mg Intravenous Q12H Charlynne Cousins, MD      . guaiFENesin [facility]) 12 hr tablet 600 mg  600 mg Oral BID Vianne Bulls, MD   600 mg at 10/12/20 0831  . guaiFENesin-dextromethorphan (ROBITUSSIN DM) 100-10 MG/5ML syrup 5 mL  5 mL Oral Q4H PRN Jettie Booze, MD   5 mL at 10/11/20 1835  . insulin aspart (novoLOG) injection 0-9 Units  0-9 Units Subcutaneous TID WC Opyd, Ilene Qua, MD   2 Units at 10/11/20 1835  . insulin glargine (LANTUS) injection 15 Units  15 Units Subcutaneous QHS Norval Morton, MD   15 Units at 10/11/20 2114  . lisinopril (ZESTRIL) tablet 5 mg  5 mg Oral Daily Charlynne Cousins, MD   5 mg at 10/12/20 0831  . magnesium oxide (MAG-OX) tablet 400 mg  400 mg Oral BID Charlynne Cousins, MD      . metoprolol succinate (TOPROL-XL) 24 hr tablet 25 mg  25 mg Oral Daily Smith, Rondell A, MD   25 mg at 10/12/20 0830  . mirtazapine (REMERON) tablet 7.5  mg  7.5 mg Oral QHS Smith, Rondell A, MD   7.5 mg at 10/11/20 2106  . ondansetron (ZOFRAN) injection 4 mg  4 mg Intravenous Q6H PRN Opyd, Ilene Qua, MD      . oxybutynin (DITROPAN) tablet 5 mg  5 mg Oral BID Fuller Plan A, MD   5 mg at 10/12/20 0831  . potassium chloride SA (KLOR-CON) CR tablet 40 mEq  40 mEq Oral BID Charlynne Cousins, MD      . pregabalin (LYRICA) capsule 75 mg  75 mg Oral QHS Fuller Plan A, MD   75 mg at 10/11/20 2106  . sodium chloride flush (NS) 0.9 % injection 3 mL  3 mL Intravenous Q12H Opyd, Ilene Qua, MD   3 mL at 10/12/20  2119  . sodium chloride flush (NS) 0.9 % injection 3 mL  3 mL Intravenous PRN Opyd, Ilene Qua, MD         Discharge Medications: Please see discharge summary for a list of discharge medications.  Relevant Imaging Results:  Relevant Lab Results:   Additional Information [phone]  Bethann Berkshire, LCSW

## 2020-10-12 NOTE — Care Management Important Message (Signed)
Important Message  Patient Details  Name: [name] MRN: [id number] Date of Birth: 1933-04-05   Medicare Important Message Given:  Yes     [name] 10/12/2020, 11:13 AM

## 2020-10-13 DIAGNOSIS — I10 Essential (primary) hypertension: Secondary | ICD-10-CM

## 2020-10-13 DIAGNOSIS — J690 Pneumonitis due to inhalation of food and vomit: Secondary | ICD-10-CM

## 2020-10-13 LAB — GLUCOSE, CAPILLARY
Glucose-Capillary: 70 mg/dL (ref 70–99)
Glucose-Capillary: 136 mg/dL — ABNORMAL HIGH (ref 70–99)

## 2020-10-13 LAB — BASIC METABOLIC PANEL
Anion gap: 10 (ref 5–15)
BUN: 21 mg/dL (ref 8–23)
CO2: 31 mmol/L (ref 22–32)
Calcium: 9.3 mg/dL (ref 8.9–10.3)
Chloride: 104 mmol/L (ref 98–111)
Creatinine, Ser: 1.43 mg/dL — ABNORMAL HIGH (ref 0.44–1.00)
GFR, Estimated: 36 mL/min — ABNORMAL LOW (ref >60)
Glucose, Bld: 96 mg/dL (ref 70–99)
Potassium: 3.8 mmol/L (ref 3.5–5.1)
Sodium: 145 mmol/L (ref 135–145)

## 2020-10-13 LAB — CULTURE, BLOOD (SINGLE)
Culture: NO GROWTH
Special Requests: ADEQUATE

## 2020-10-13 LAB — SARS CORONAVIRUS 2 BY RT PCR (HOSPITAL ORDER, PERFORMED IN ~~LOC~~ HOSPITAL LAB): SARS Coronavirus 2: NEGATIVE

## 2020-10-13 MED ORDER — TORSEMIDE 20 MG PO TABS: 20.0000 mg | ORAL_TABLET | Freq: Two times a day (BID) | ORAL | 0 refills | Status: DC

## 2020-10-13 MED ORDER — LISINOPRIL 5 MG PO TABS: 5.0000 mg | ORAL_TABLET | Freq: Every day | ORAL | 0 refills | Status: DC

## 2020-10-13 NOTE — Consult Note (Signed)
   [facility] Inpatient Consult   10/13/2020  [name] October 22, 1933 [id number]   [place] Organization [ACO] Patient:  [name]  Was alerted by [employer] patient is long term skilled resident and her needs will be followed by facility MD.  For questions, please contact:  Natividad Brood, RN BSN [facility] Liaison  [phone] business mobile phone Toll free office [phone]  Fax number: [phone] [email or web address]

## 2020-10-13 NOTE — Discharge Summary (Signed)
Physician Discharge Summary  [name] OEV:[id number] DOB: 28-Feb-1933 DOA: 10/08/2020  PCP: [name], MD  Admit date: 10/08/2020 Discharge date: 10/13/2020  Admitted From: Home Disposition:  SNF  Recommendations for Outpatient Follow-up:  1. Follow up with PCP in 1-2 weeks 2. Please obtain BMP/CBC in one week   Home Health:No Equipment/Devices:None  Discharge Condition:Stable CODE STATUS: Full Diet recommendation: Heart Healthy   Brief/Interim Summary: 86 y.o. female past medical history of combined systolic and diastolic heart failure, essential hypertension, chronic kidney stage III with a history of CVA recurrent DVT on Eliquis left bundle branch block SVT status post ablation and a recent Covid 19 infection on 06/12/2020 comes into the hospital for shortness of breath that started 3 days prior to admission, in the ED he was found to have a temperature of 100.5 breathing 20 times per minute with a white count of 4 hemoglobin of 10 lactic acid of 1.4 chest x-ray showed new infiltrates in both lung fields influenza and SARS-CoV-2 PCR were negative, he was started on IV Lasix Rocephin and doxy.  Empiric antibiotics were discontinued she was started on aggressive IV diuresis she is diuresed about 3 L and her breathing is improving along with her weight which is coming down.   Discharge Diagnoses:  Active Problems:   Thrombocytopenia ([place])   HTN (hypertension)   Dysphagia   Personal history of breast cancer   Pacemaker   Aspiration pneumonia ([place])   Acute on chronic combined systolic and diastolic CHF (congestive heart failure) (HCC)   GERD (gastroesophageal reflux disease) Acute on chronic combined systolic and diastolic heart failure: Last 2D echo in September 2021 showed an EF of 30% with global hypokinesia and grade 1 diastolic heart failure. Chest x-ray on admission showed bilateral infiltrates with a BNP of greater than 2000. She was started on IV Lasix and diuresed  nicely, she is negative out 3-1/2 L and weight improved nicely. She was changed to oral torsemide which she will continue as an outpatient at a higher dose. On the day of discharge her estimated dry weight is around 81 kg. We will continue oral torsemide check be met in 1 week and follow-up with her PCP in 1 week for further titration as needed.  Dysphagia: Continue dysphagia 2 diet.  Elevated troponins: Basically remained flat likely demand ischemia in the setting of heart failure.  History of recurrent DVTs: No changes made to her Eliquis.  History of SVT status post pacemaker: Remains sinus rhythm continue amiodarone 200 mg p.o. daily.  Essential hypertension: No change made to her medication.  Chronic kidney disease stage IIIb: Her creatinine appears to be at baseline.  Diabetes mellitus type 2: With an A1c of 7.5 no changes made to her regimen.  History of CVA: Continue statin.  Anemia of chronic disease: Hemoglobin appears to be at baseline.  Chronic thrombocytopenia.: Appears at baseline.  History of breast cancer: Follow-up with her oncologist as an outpatient continue current regimen.  Discharge Instructions  Discharge Instructions    Diet - low sodium heart healthy   Complete by: As directed    Increase activity slowly   Complete by: As directed      Allergies as of 10/13/2020      Reactions   Aspirin Itching   Penicillins Itching, Rash   DID THE REACTION INVOLVE: Swelling of the face/tongue/throat, SOB, or low BP? Yes Sudden or severe rash/hives, skin peeling, or the inside of the mouth or nose? No Did it require medical  treatment? Yes When did it last happen?"More than 10 years ago" If all above answers are "NO", may proceed with cephalosporin use.      Medication List    STOP taking these medications   albuterol 108 (90 Base) MCG/ACT inhaler Commonly known as: VENTOLIN HFA   azithromycin 250 MG tablet Commonly known as: ZITHROMAX    benzonatate 100 MG capsule Commonly known as: TESSALON   cholecalciferol 25 MCG (1000 UNIT) tablet Commonly known as: VITAMIN D3   insulin detemir 100 UNIT/ML injection Commonly known as: LEVEMIR   ipratropium 17 MCG/ACT inhaler Commonly known as: ATROVENT HFA   vitamin B-12 1000 MCG tablet Commonly known as: CYANOCOBALAMIN     TAKE these medications   amiodarone 200 MG tablet Commonly known as: PACERONE Take one tablet by mouth daily Monday-Saturday. Do NOT take Sunday   anastrozole 1 MG tablet Commonly known as: ARIMIDEX TAKE 1 TABLET(1 MG) BY MOUTH DAILY What changed: See the new instructions.   atorvastatin 20 MG tablet Commonly known as: LIPITOR TAKE 1 TABLET(20 MG) BY MOUTH AT BEDTIME What changed: See the new instructions.   diclofenac sodium 1 % Gel Commonly known as: Voltaren Apply 2 g topically 4 (four) times daily. What changed:   how much to take  additional instructions   Docusate Sodium 100 MG capsule Take 100 mg by mouth daily.   Eliquis 2.5 MG Tabs tablet Generic drug: apixaban TAKE 1 TABLET(2.5 MG) BY MOUTH TWICE DAILY What changed: See the new instructions.   famotidine 40 MG tablet Commonly known as: PEPCID Take 40 mg by mouth 2 (two) times daily.   ferrous sulfate 325 (65 FE) MG tablet Take 325 mg by mouth daily with breakfast.   HumaLOG KwikPen 100 UNIT/ML KwikPen Generic drug: insulin lispro Inject 0-12 Units into the skin See admin instructions. Inject 0-12 units subcutaneously 3 times daily per sliding scale: BG 0-150 = 0 units BG 151-200 = 2 units BG 201-250 = 4 units BG 251-300 = 6 units BG 301-350 = 8 units BG 351-400 = 10 units BG 401+ = 12 units What changed: Another medication with the same name was removed. Continue taking this medication, and follow the directions you see here.   insulin glargine 100 unit/mL Sopn Commonly known as: LANTUS Inject 15 Units into the skin at bedtime.   lisinopril 5 MG tablet Commonly known  as: ZESTRIL Take 1 tablet (5 mg total) by mouth daily.   metoprolol succinate 25 MG 24 hr tablet Commonly known as: TOPROL-XL Take 1 tablet (25 mg total) by mouth daily.   mirtazapine 7.5 MG tablet Commonly known as: REMERON TAKE 1 TABLET(7.5 MG) BY MOUTH AT BEDTIME What changed: See the new instructions.   omeprazole 20 MG capsule Commonly known as: PRILOSEC Take 20 mg by mouth See admin instructions. Take 1 tablet by mouth at 6AM, 8AM, and 4PM.   oxybutynin 5 MG tablet Commonly known as: DITROPAN TAKE 1 TABLET(5 MG) BY MOUTH TWICE DAILY What changed: See the new instructions.   pregabalin 75 MG capsule Commonly known as: LYRICA Take 1 capsule (75 mg total) by mouth at bedtime.   torsemide 20 MG tablet Commonly known as: DEMADEX Take 1 tablet (20 mg total) by mouth 2 (two) times daily. You may take an extra 20 mg tablet daily as needed for swelling What changed: when to take this       Allergies  Allergen Reactions  . Aspirin Itching  . Penicillins Itching and Rash  DID THE REACTION INVOLVE: Swelling of the face/tongue/throat, SOB, or low BP? Yes Sudden or severe rash/hives, skin peeling, or the inside of the mouth or nose? No Did it require medical treatment? Yes When did it last happen?"More than 10 years ago" If all above answers are "NO", may proceed with cephalosporin use.     Consultations:  None   Procedures/Studies: DG Chest Port 1 View  Result Date: 10/08/2020 CLINICAL DATA:  Shortness of breath, leg swelling since Monday, questionable sepsis, pneumonia on antibiotics EXAM: PORTABLE CHEST 1 VIEW COMPARISON:  Portable exam 1125 hours compared to 06/20/2020 FINDINGS: LEFT subclavian sequential transvenous pacemaker leads project at RIGHT atrium and RIGHT ventricle. Enlargement of cardiac silhouette. Mediastinal contours and pulmonary vascularity normal. Bronchitic changes with bibasilar interstitial infiltrates new since previous exam. Upper lungs  clear. No definite pleural effusion or pneumothorax. IMPRESSION: New interstitial infiltrates in the mid to lower lungs bilaterally which could represent edema or infection. Enlargement of cardiac silhouette post pacemaker. Electronically Signed   By: Lavonia Dana M.D.   On: 10/08/2020 11:47      Subjective: No compalins  Discharge Exam: Vitals:   10/12/20 2008 10/13/20 0553  BP: 117/65 137/85  Pulse: 67 60  Resp: 17 16  Temp: 98.4 F (36.9 C) 97.7 F (36.5 C)  SpO2: 100% 98%   Vitals:   10/12/20 1346 10/12/20 2008 10/13/20 0111 10/13/20 0553  BP: 119/64 117/65  137/85  Pulse: 60 67  60  Resp: '16 17  16  ' Temp: 97.7 F (36.5 C) 98.4 F (36.9 C)  97.7 F (36.5 C)  TempSrc: Oral Oral  Oral  SpO2: 100% 100%  98%  Weight:   82.7 kg   Height:        General: Pt is alert, awake, not in acute distress Cardiovascular: RRR, S1/S2 +, no rubs, no gallops Respiratory: CTA bilaterally, no wheezing, no rhonchi Abdominal: Soft, NT, ND, bowel sounds + Extremities: no edema, no cyanosis    The results of significant diagnostics from this hospitalization (including imaging, microbiology, ancillary and laboratory) are listed below for reference.     Microbiology: Recent Results (from the past 240 hour(s))  Urine culture     Status: Abnormal   Collection Time: 10/08/20  3:43 AM   Specimen: In/Out Cath Urine  Result Value Ref Range Status   Specimen Description IN/OUT CATH URINE  Final   Special Requests   Final    NONE Performed at [facility], [address] [place], [place] [address]    Culture MULTIPLE SPECIES PRESENT, SUGGEST RECOLLECTION (A)  Final   Report Status 10/10/2020 FINAL  Final  Blood culture (routine single)     Status: None   Collection Time: 10/08/20 11:21 AM   Specimen: BLOOD  Result Value Ref Range Status   Specimen Description BLOOD RIGHT ANTECUBITAL  Final   Special Requests   Final    BOTTLES DRAWN AEROBIC AND ANAEROBIC Blood Culture adequate  volume   Culture   Final    NO GROWTH 5 DAYS Performed at [facility], [place] [address], [place], [place] [address]    Report Status 10/13/2020 FINAL  Final  Resp Panel by RT-PCR (Flu A&B, Covid) Nasopharyngeal Swab     Status: None   Collection Time: 10/08/20 11:23 AM   Specimen: Nasopharyngeal Swab; Nasopharyngeal(NP) swabs in vial transport medium  Result Value Ref Range Status   SARS Coronavirus 2 by RT PCR NEGATIVE NEGATIVE Final    Comment: (  NOTE) SARS-CoV-2 target nucleic acids are NOT DETECTED.  The SARS-CoV-2 RNA is generally detectable in upper respiratory specimens during the acute phase of infection. The lowest concentration of SARS-CoV-2 viral copies this assay can detect is 138 copies/mL. A negative result does not preclude SARS-Cov-2 infection and should not be used as the sole basis for treatment or other patient management decisions. A negative result may occur with  improper specimen collection/handling, submission of specimen other than nasopharyngeal swab, presence of viral mutation(s) within the areas targeted by this assay, and inadequate number of viral copies(<138 copies/mL). A negative result must be combined with clinical observations, patient history, and epidemiological information. The expected result is Negative.  Fact Sheet for Patients:  EntrepreneurPulse.com.au  Fact Sheet for Healthcare Providers:  IncredibleEmployment.be  This test is no t yet approved or cleared by the [place] FDA and  has been authorized for detection and/or diagnosis of SARS-CoV-2 by FDA under an Emergency Use Authorization (EUA). This EUA will remain  in effect (meaning this test can be used) for the duration of the COVID-19 declaration under Section 564(b)(1) of the Act, 21 U.S.C.section 360bbb-3(b)(1), unless the authorization is terminated  or revoked sooner.       Influenza A by PCR NEGATIVE NEGATIVE Final   Influenza  B by PCR NEGATIVE NEGATIVE Final    Comment: (NOTE) The Xpert Xpress SARS-CoV-2/FLU/RSV plus assay is intended as an aid in the diagnosis of influenza from Nasopharyngeal swab specimens and should not be used as a sole basis for treatment. Nasal washings and aspirates are unacceptable for Xpert Xpress SARS-CoV-2/FLU/RSV testing.  Fact Sheet for Patients: EntrepreneurPulse.com.au  Fact Sheet for Healthcare Providers: IncredibleEmployment.be  This test is not yet approved or cleared by the [place] FDA and has been authorized for detection and/or diagnosis of SARS-CoV-2 by FDA under an Emergency Use Authorization (EUA). This EUA will remain in effect (meaning this test can be used) for the duration of the COVID-19 declaration under Section 564(b)(1) of the Act, 21 U.S.C. section 360bbb-3(b)(1), unless the authorization is terminated or revoked.  Performed at [facility], [place] [address] [place], [place] [address]      Labs: BNP (last 3 results) Recent Labs    07/16/20 0633 07/17/20 0848 10/08/20 1121  BNP 872.3* 856.6* 7,616.0*   Basic Metabolic Panel: Recent Labs  Lab 10/09/20 0313 10/10/20 0347 10/11/20 0129 10/12/20 0348 10/13/20 0259  NA 143 146* 146* 145 145  K 3.3* 3.3* 4.1 3.3* 3.8  CL 104 106 107 104 104  CO2 '27 31 30 31 31  ' GLUCOSE 190* 127* 150* 120* 96  BUN '18 18 22 19 21  ' CREATININE 1.35* 1.35* 1.32* 1.27* 1.43*  CALCIUM 8.7* 8.5* 8.4* 8.6* 9.3   Liver Function Tests: Recent Labs  Lab 10/08/20 1204  AST 38  ALT 56*  ALKPHOS 95  BILITOT 0.9  PROT 7.4  ALBUMIN 3.7   No results for input(s): LIPASE, AMYLASE in the last 168 hours. No results for input(s): AMMONIA in the last 168 hours. CBC: Recent Labs  Lab 10/08/20 1202 10/08/20 1204  WBC  --  4.0  NEUTROABS  --  2.3  HGB 10.5* 10.7*  HCT 31.0* 35.7*  MCV  --  98.6  PLT  --  113*   Cardiac Enzymes: No results for input(s): CKTOTAL,  CKMB, CKMBINDEX, TROPONINI in the last 168 hours. BNP: Invalid input(s): POCBNP CBG: Recent Labs  Lab 10/12/20 0811 10/12/20 1223 10/12/20 1608 10/12/20 2102 10/13/20 7371  GLUCAP 102* 180* 206* 175* 70   D-Dimer No results for input(s): DDIMER in the last 72 hours. Hgb A1c No results for input(s): HGBA1C in the last 72 hours. Lipid Profile No results for input(s): CHOL, HDL, LDLCALC, TRIG, CHOLHDL, LDLDIRECT in the last 72 hours. Thyroid function studies No results for input(s): TSH, T4TOTAL, T3FREE, THYROIDAB in the last 72 hours.  Invalid input(s): FREET3 Anemia work up No results for input(s): VITAMINB12, FOLATE, FERRITIN, TIBC, IRON, RETICCTPCT in the last 72 hours. Urinalysis    Component Value Date/Time   COLORURINE YELLOW 10/09/2020 0315   APPEARANCEUR CLEAR 10/09/2020 0315   LABSPEC 1.010 10/09/2020 0315   PHURINE 6.0 10/09/2020 0315   GLUCOSEU NEGATIVE 10/09/2020 0315   HGBUR NEGATIVE 10/09/2020 0315   BILIRUBINUR NEGATIVE 10/09/2020 0315   BILIRUBINUR negative 11/28/2019 1206   KETONESUR NEGATIVE 10/09/2020 0315   PROTEINUR NEGATIVE 10/09/2020 0315   UROBILINOGEN 0.2 11/28/2019 1206   UROBILINOGEN 0.2 10/12/2012 2037   NITRITE NEGATIVE 10/09/2020 0315   LEUKOCYTESUR MODERATE (A) 10/09/2020 0315   Sepsis Labs Invalid input(s): PROCALCITONIN,  WBC,  LACTICIDVEN Microbiology Recent Results (from the past 240 hour(s))  Urine culture     Status: Abnormal   Collection Time: 10/08/20  3:43 AM   Specimen: In/Out Cath Urine  Result Value Ref Range Status   Specimen Description IN/OUT CATH URINE  Final   Special Requests   Final    NONE Performed at [facility], [address] [place], [place] [address]    Culture MULTIPLE SPECIES PRESENT, SUGGEST RECOLLECTION (A)  Final   Report Status 10/10/2020 FINAL  Final  Blood culture (routine single)     Status: None   Collection Time: 10/08/20 11:21 AM   Specimen: BLOOD  Result Value Ref Range Status    Specimen Description BLOOD RIGHT ANTECUBITAL  Final   Special Requests   Final    BOTTLES DRAWN AEROBIC AND ANAEROBIC Blood Culture adequate volume   Culture   Final    NO GROWTH 5 DAYS Performed at [facility], [place] [address], [place], [place] [address]    Report Status 10/13/2020 FINAL  Final  Resp Panel by RT-PCR (Flu A&B, Covid) Nasopharyngeal Swab     Status: None   Collection Time: 10/08/20 11:23 AM   Specimen: Nasopharyngeal Swab; Nasopharyngeal(NP) swabs in vial transport medium  Result Value Ref Range Status   SARS Coronavirus 2 by RT PCR NEGATIVE NEGATIVE Final    Comment: (NOTE) SARS-CoV-2 target nucleic acids are NOT DETECTED.  The SARS-CoV-2 RNA is generally detectable in upper respiratory specimens during the acute phase of infection. The lowest concentration of SARS-CoV-2 viral copies this assay can detect is 138 copies/mL. A negative result does not preclude SARS-Cov-2 infection and should not be used as the sole basis for treatment or other patient management decisions. A negative result may occur with  improper specimen collection/handling, submission of specimen other than nasopharyngeal swab, presence of viral mutation(s) within the areas targeted by this assay, and inadequate number of viral copies(<138 copies/mL). A negative result must be combined with clinical observations, patient history, and epidemiological information. The expected result is Negative.  Fact Sheet for Patients:  EntrepreneurPulse.com.au  Fact Sheet for Healthcare Providers:  IncredibleEmployment.be  This test is no t yet approved or cleared by the [place] FDA and  has been authorized for detection and/or diagnosis of SARS-CoV-2 by FDA under an Emergency Use Authorization (EUA). This EUA will remain  in effect (meaning  this test can be used) for the duration of the COVID-19 declaration under Section 564(b)(1) of the Act, 21 U.S.C.section  360bbb-3(b)(1), unless the authorization is terminated  or revoked sooner.       Influenza A by PCR NEGATIVE NEGATIVE Final   Influenza B by PCR NEGATIVE NEGATIVE Final    Comment: (NOTE) The Xpert Xpress SARS-CoV-2/FLU/RSV plus assay is intended as an aid in the diagnosis of influenza from Nasopharyngeal swab specimens and should not be used as a sole basis for treatment. Nasal washings and aspirates are unacceptable for Xpert Xpress SARS-CoV-2/FLU/RSV testing.  Fact Sheet for Patients: EntrepreneurPulse.com.au  Fact Sheet for Healthcare Providers: IncredibleEmployment.be  This test is not yet approved or cleared by the [place] FDA and has been authorized for detection and/or diagnosis of SARS-CoV-2 by FDA under an Emergency Use Authorization (EUA). This EUA will remain in effect (meaning this test can be used) for the duration of the COVID-19 declaration under Section 564(b)(1) of the Act, 21 U.S.C. section 360bbb-3(b)(1), unless the authorization is terminated or revoked.  Performed at [facility], [place] [address], [place], [place] [address]      Time coordinating discharge: Over 40 minutes  SIGNED:   Charlynne Cousins, MD  [facility] 10/13/2020, 9:10 AM Pager   If 7PM-7AM, please contact night-coverage [email or web address] Password [email or web address]

## 2020-10-13 NOTE — TOC Transition Note (Addendum)
Transition of Care [facility]) - CM/SW Discharge Note   Patient Details  Name: [name] MRN: [id number] Date of Birth: 16-Aug-1933  [facility] CM/SW Contact:  Bethann Berkshire, LCSW Phone Number: 10/13/2020, 1:32 PM   Clinical Narrative:     [facility] will [facility] to: [facility] Anticipated DC date: 10/13/20 Family notified: [name] Transport by: Corey Harold   Per MD patient ready for DC to [facility]. RN, patient, patient's family, and facility notified of DC. Discharge Summary and FL2 sent to facility. RN to call report prior to discharge [phone] Room 703). DC packet on chart. Ambulance transport requested for patient.   CSW will sign off for now as social work intervention is no longer needed. Please consult [place] again if new needs arise.  Final next level of care: Skilled Nursing Facility Barriers to Discharge: No Barriers Identified   Patient Goals and CMS Choice Patient states their goals for this hospitalization and ongoing recovery are:: Return to SNF      Discharge Placement              [facility] bed at: [facility] Patient to be transferred to facility by: [place] Name of family member notified: [name] Patient and family notified of of transfer: 10/13/20  Discharge Plan and Services                                     Social Determinants of Health (SDOH) Interventions     Readmission Risk Interventions Readmission Risk Prevention Plan 01/03/2019 01/02/2019  Transportation Screening (No Data) Complete  PCP or Specialist Appt within 3-5 Days - Complete  HRI or [place] - Complete  Social Work Consult for [place] Planning/Counseling - Complete  Palliative Care Screening - Not Applicable  Medication Review Press photographer) - Complete  Some recent data might be hidden

## 2020-10-13 NOTE — TOC Progression Note (Addendum)
Transition of Care [facility]) - Progression Note    Patient Details  Name: [name] MRN: [id number] Date of Birth: [birth year]  Transition of Care [facility] CM/SW [place], [place] Phone Number: 10/13/2020, 11:25 AM  Clinical Narrative:     CSW started auth Ref #[id number]; clinicals faxed. CSW notified pt daughter of her d/c today. CSW notified SNF that pt is d/cing pending covid test. Pt will return to room 703 at SNF.   Expected Discharge Plan: [place] Barriers to Discharge: Continued Medical Work up  Expected Discharge Plan and Services Expected Discharge Plan: [place]         Expected Discharge Date: 10/13/20                                     Social Determinants of Health (SDOH) Interventions    Readmission Risk Interventions Readmission Risk Prevention Plan 01/03/2019 01/02/2019  Transportation Screening (No Data) Complete  PCP or Specialist Appt within 3-5 Days - Complete  HRI or [place] - Complete  Social Work Consult for [place] Planning/Counseling - Complete  Palliative Care Screening - Not Applicable  Medication Review Press photographer) - Complete  Some recent data might be hidden

## 2020-10-13 NOTE — Plan of Care (Signed)

## 2020-10-13 NOTE — Progress Notes (Signed)
Attempt to call report x 3 nobody answer the phone, left a message to call back.

## 2020-10-13 NOTE — Progress Notes (Signed)
Physical Therapy Treatment Patient Details Name: [name] MRN: [id number] DOB: 1933-05-15 Today's Date: 10/13/2020    History of Present Illness 86 y.o. female past medical history of combined systolic and diastolic heart failure, HTN, CKD,  CVA, OA,  recurrent DVT, pacer,  SVT status post ablation and a recent Covid 19 infection on 06/12/2020 comes into the hospital for shortness of breath. Pt with acute on chronic heart failure.    PT Comments    Patient progressing well towards PT goals. Reports feeling good today and eager to d/c back to rehab. Tolerated bed mobility with supervision for safety with increased time but not physical assist. Able to perform SPT bed to/from chair with MIn guard assist, taking steps to get to chair but needing UE support for safety. Reports feeling stronger today. Her goal is to walk again and continue working with therapy at rehab. Continue to recommend return to SNF. Will follow.    Follow Up Recommendations  SNF     Equipment Recommendations  None recommended by PT    Recommendations for Other Services       Precautions / Restrictions Precautions Precautions: Fall Restrictions Weight Bearing Restrictions: No    Mobility  Bed Mobility Overal bed mobility: Needs Assistance Bed Mobility: Supine to Sit;Sit to Supine     Supine to sit: Supervision;HOB elevated Sit to supine: Supervision   General bed mobility comments: No assist needed, increased time. Able to scoot self up in bed without difficulty.  Transfers Overall transfer level: Needs assistance Equipment used: None Transfers: Sit to/from [facility] Sit to Stand: Min guard Stand pivot transfers: Min guard       General transfer comment: Able to perform SPT  bed to/from chair with MIn guard assist, able to take a few steps holding onto chair arms for support when turning.  Ambulation/Gait                 Stairs             Wheelchair  Mobility    Modified Rankin (Stroke Patients Only)       Balance Overall balance assessment: Needs assistance Sitting-balance support: Feet supported;No upper extremity supported Sitting balance-Leahy Scale: Good     Standing balance support: During functional activity Standing balance-Leahy Scale: Fair Standing balance comment: Requires UE support in standing.                            Cognition Arousal/Alertness: Awake/alert Behavior During Therapy: WFL for tasks assessed/performed Overall Cognitive Status: Within Functional Limits for tasks assessed                                 General Comments: Follows commands well. Seems WFL for basic mobility tasks but not formally assessed. KNows she is going back to rehab today.      Exercises      General Comments General comments (skin integrity, edema, etc.): VSS on RA.      Pertinent Vitals/Pain Pain Assessment: No/denies pain    Home Living                      Prior Function            PT Goals (current goals can now be found in the care plan section) Progress towards PT goals: Progressing toward goals    Frequency  Min 2X/week      PT Plan Current plan remains appropriate    Co-evaluation              AM-PAC PT "6 Clicks" Mobility   Outcome Measure  Help needed turning from your back to your side while in a flat bed without using bedrails?: None Help needed moving from lying on your back to sitting on the side of a flat bed without using bedrails?: None Help needed moving to and from a bed to a chair (including a wheelchair)?: A Little Help needed standing up from a chair using your arms (e.g., wheelchair or bedside chair)?: A Little Help needed to walk in hospital room?: A Lot Help needed climbing 3-5 steps with a railing? : Total 6 Click Score: 17    End of Session Equipment Utilized During Treatment: Gait belt Activity Tolerance: Patient tolerated  treatment well Patient left: in bed;with call bell/phone within reach;with bed alarm set Nurse Communication: Mobility status PT Visit Diagnosis: Other abnormalities of gait and mobility (R26.89);Muscle weakness (generalized) (M62.81)     Time: 1000-1015 PT Time Calculation (min) (ACUTE ONLY): 15 min  Charges:  $Therapeutic Activity: 8-22 mins                     Marisa Severin, PT, DPT Acute Rehabilitation Services Pager [phone] Office [phone]       [name] 10/13/2020, 10:28 AM

## 2020-10-13 NOTE — Progress Notes (Signed)
D/C instructions printed and placed in packet at nurse's station. Tele and IV removed, tolerated well.  

## 2020-10-13 NOTE — Progress Notes (Signed)
Occupational Therapy Treatment Patient Details Name: [name] MRN: [id number] DOB: 06/12/1933 Today's Date: 10/13/2020    History of present illness 86 y.o. female past medical history of combined systolic and diastolic heart failure, HTN, CKD,  CVA, OA,  recurrent DVT, pacer,  SVT status post ablation and a recent Covid 19 infection on 06/12/2020 comes into the hospital for shortness of breath. Pt with acute on chronic heart failure.   OT comments  Patient continues to make steady progress towards goals in skilled OT session. Patient's session encompassed bed level exercises in order to increase endurance and activity tolerance. Pt is motivated to get back to rehab and was able to employ teach back strategies for exercises once prompted. Therapy will continue to follow.    Follow Up Recommendations  SNF    Equipment Recommendations  None recommended by OT    Recommendations for Other Services      Precautions / Restrictions Precautions Precautions: Fall Restrictions Weight Bearing Restrictions: No       Mobility Bed Mobility                  Transfers                      Balance                                           ADL either performed or assessed with clinical judgement   ADL Overall ADL's : Needs assistance/impaired     Grooming: Wash/dry hands;Wash/dry face;Sitting;Min guard                               Functional mobility during ADLs: Minimal assistance General ADL Comments: Session focus on bed level exercises and ADLs (applying lotion) as pt had already been up with PT and had just gotten back to bed     Vision       Perception     Praxis      Cognition Arousal/Alertness: Awake/alert Behavior During Therapy: WFL for tasks assessed/performed Overall Cognitive Status: Within Functional Limits for tasks assessed                                 General Comments: Follows commands  well. Seems WFL for basic mobility tasks but not formally assessed. KNows she is going back to rehab today.        Exercises General Exercises - Lower Extremity Ankle Circles/Pumps: AROM;Both;20 reps Straight Leg Raises: AROM;Both;15 reps Other Exercises Other Exercises: Hip bridging with BLEs x5   Shoulder Instructions       General Comments      Pertinent Vitals/ Pain       Pain Assessment: No/denies pain  Home Living                                          Prior Functioning/Environment              Frequency  Min 1X/week        Progress Toward Goals  OT Goals(current goals can now be found in the care plan section)  Progress towards OT goals: Progressing  toward goals  Acute Rehab OT Goals Patient Stated Goal: Im ready to get back to rehab OT Goal Formulation: With patient Time For Goal Achievement: 10/23/20 Potential to Achieve Goals: Good  Plan Discharge plan remains appropriate    Co-evaluation                 AM-PAC OT "6 Clicks" Daily Activity     Outcome Measure   Help from another person eating meals?: None Help from another person taking care of personal grooming?: A Little Help from another person toileting, which includes using toliet, bedpan, or urinal?: A Lot Help from another person bathing (including washing, rinsing, drying)?: A Lot Help from another person to put on and taking off regular upper body clothing?: A Little Help from another person to put on and taking off regular lower body clothing?: A Lot 6 Click Score: 16    End of Session    OT Visit Diagnosis: Unsteadiness on feet (R26.81);Muscle weakness (generalized) (M62.81)   Activity Tolerance Patient tolerated treatment well   Patient Left in bed;with call bell/phone within reach;with bed alarm set;with nursing/sitter in room   Nurse Communication Mobility status        Time: 6160-7371 OT Time Calculation (min): 15 min  Charges: OT General  Charges $OT Visit: 1 Visit OT Treatments $Therapeutic Activity: 8-22 mins  Corinne Ports [place]. [place], [place] Acute Rehabilitation Services [place] 10/13/2020, 3:24 PM

## 2020-10-14 ENCOUNTER — Telehealth: Payer: Self-pay

## 2020-10-14 DIAGNOSIS — R2689 Other abnormalities of gait and mobility: Secondary | ICD-10-CM | POA: Diagnosis not present

## 2020-10-14 DIAGNOSIS — I69391 Dysphagia following cerebral infarction: Secondary | ICD-10-CM | POA: Diagnosis not present

## 2020-10-14 DIAGNOSIS — E118 Type 2 diabetes mellitus with unspecified complications: Secondary | ICD-10-CM | POA: Diagnosis not present

## 2020-10-14 DIAGNOSIS — R278 Other lack of coordination: Secondary | ICD-10-CM | POA: Diagnosis not present

## 2020-10-14 DIAGNOSIS — I5043 Acute on chronic combined systolic (congestive) and diastolic (congestive) heart failure: Secondary | ICD-10-CM | POA: Diagnosis not present

## 2020-10-14 DIAGNOSIS — R946 Abnormal results of thyroid function studies: Secondary | ICD-10-CM | POA: Diagnosis not present

## 2020-10-14 DIAGNOSIS — R1312 Dysphagia, oropharyngeal phase: Secondary | ICD-10-CM | POA: Diagnosis not present

## 2020-10-14 DIAGNOSIS — R2681 Unsteadiness on feet: Secondary | ICD-10-CM | POA: Diagnosis not present

## 2020-10-14 DIAGNOSIS — M6281 Muscle weakness (generalized): Secondary | ICD-10-CM | POA: Diagnosis not present

## 2020-10-14 DIAGNOSIS — D696 Thrombocytopenia, unspecified: Secondary | ICD-10-CM | POA: Diagnosis not present

## 2020-10-14 DIAGNOSIS — Z794 Long term (current) use of insulin: Secondary | ICD-10-CM | POA: Diagnosis not present

## 2020-10-14 DIAGNOSIS — Z9181 History of falling: Secondary | ICD-10-CM | POA: Diagnosis not present

## 2020-10-14 NOTE — Telephone Encounter (Signed)
I left the pt a message to call the office back so that I can do a transition of care call.

## 2020-10-15 DIAGNOSIS — Z794 Long term (current) use of insulin: Secondary | ICD-10-CM | POA: Diagnosis not present

## 2020-10-15 DIAGNOSIS — R2689 Other abnormalities of gait and mobility: Secondary | ICD-10-CM | POA: Diagnosis not present

## 2020-10-15 DIAGNOSIS — R2681 Unsteadiness on feet: Secondary | ICD-10-CM | POA: Diagnosis not present

## 2020-10-15 DIAGNOSIS — R1312 Dysphagia, oropharyngeal phase: Secondary | ICD-10-CM | POA: Diagnosis not present

## 2020-10-15 DIAGNOSIS — E119 Type 2 diabetes mellitus without complications: Secondary | ICD-10-CM | POA: Diagnosis not present

## 2020-10-15 DIAGNOSIS — I5041 Acute combined systolic (congestive) and diastolic (congestive) heart failure: Secondary | ICD-10-CM | POA: Diagnosis not present

## 2020-10-15 DIAGNOSIS — I480 Paroxysmal atrial fibrillation: Secondary | ICD-10-CM | POA: Diagnosis not present

## 2020-10-15 DIAGNOSIS — Z9181 History of falling: Secondary | ICD-10-CM | POA: Diagnosis not present

## 2020-10-15 DIAGNOSIS — M6281 Muscle weakness (generalized): Secondary | ICD-10-CM | POA: Diagnosis not present

## 2020-10-15 DIAGNOSIS — E118 Type 2 diabetes mellitus with unspecified complications: Secondary | ICD-10-CM | POA: Diagnosis not present

## 2020-10-15 DIAGNOSIS — R5381 Other malaise: Secondary | ICD-10-CM | POA: Diagnosis not present

## 2020-10-15 DIAGNOSIS — I82409 Acute embolism and thrombosis of unspecified deep veins of unspecified lower extremity: Secondary | ICD-10-CM | POA: Diagnosis not present

## 2020-10-15 DIAGNOSIS — R278 Other lack of coordination: Secondary | ICD-10-CM | POA: Diagnosis not present

## 2020-10-15 DIAGNOSIS — I69391 Dysphagia following cerebral infarction: Secondary | ICD-10-CM | POA: Diagnosis not present

## 2020-10-15 DIAGNOSIS — D696 Thrombocytopenia, unspecified: Secondary | ICD-10-CM | POA: Diagnosis not present

## 2020-10-15 DIAGNOSIS — I5043 Acute on chronic combined systolic (congestive) and diastolic (congestive) heart failure: Secondary | ICD-10-CM | POA: Diagnosis not present

## 2020-10-16 DIAGNOSIS — Z794 Long term (current) use of insulin: Secondary | ICD-10-CM | POA: Diagnosis not present

## 2020-10-16 DIAGNOSIS — D696 Thrombocytopenia, unspecified: Secondary | ICD-10-CM | POA: Diagnosis not present

## 2020-10-16 DIAGNOSIS — E118 Type 2 diabetes mellitus with unspecified complications: Secondary | ICD-10-CM | POA: Diagnosis not present

## 2020-10-16 DIAGNOSIS — I69391 Dysphagia following cerebral infarction: Secondary | ICD-10-CM | POA: Diagnosis not present

## 2020-10-16 DIAGNOSIS — M6281 Muscle weakness (generalized): Secondary | ICD-10-CM | POA: Diagnosis not present

## 2020-10-16 DIAGNOSIS — Z9181 History of falling: Secondary | ICD-10-CM | POA: Diagnosis not present

## 2020-10-16 DIAGNOSIS — R2681 Unsteadiness on feet: Secondary | ICD-10-CM | POA: Diagnosis not present

## 2020-10-16 DIAGNOSIS — R278 Other lack of coordination: Secondary | ICD-10-CM | POA: Diagnosis not present

## 2020-10-16 DIAGNOSIS — G8929 Other chronic pain: Secondary | ICD-10-CM | POA: Diagnosis not present

## 2020-10-16 DIAGNOSIS — R1312 Dysphagia, oropharyngeal phase: Secondary | ICD-10-CM | POA: Diagnosis not present

## 2020-10-16 DIAGNOSIS — R2689 Other abnormalities of gait and mobility: Secondary | ICD-10-CM | POA: Diagnosis not present

## 2020-10-16 DIAGNOSIS — I5043 Acute on chronic combined systolic (congestive) and diastolic (congestive) heart failure: Secondary | ICD-10-CM | POA: Diagnosis not present

## 2020-10-18 DIAGNOSIS — R1312 Dysphagia, oropharyngeal phase: Secondary | ICD-10-CM | POA: Diagnosis not present

## 2020-10-18 DIAGNOSIS — R278 Other lack of coordination: Secondary | ICD-10-CM | POA: Diagnosis not present

## 2020-10-18 DIAGNOSIS — M6281 Muscle weakness (generalized): Secondary | ICD-10-CM | POA: Diagnosis not present

## 2020-10-18 DIAGNOSIS — R2681 Unsteadiness on feet: Secondary | ICD-10-CM | POA: Diagnosis not present

## 2020-10-18 DIAGNOSIS — I69391 Dysphagia following cerebral infarction: Secondary | ICD-10-CM | POA: Diagnosis not present

## 2020-10-18 DIAGNOSIS — R2689 Other abnormalities of gait and mobility: Secondary | ICD-10-CM | POA: Diagnosis not present

## 2020-10-18 DIAGNOSIS — E118 Type 2 diabetes mellitus with unspecified complications: Secondary | ICD-10-CM | POA: Diagnosis not present

## 2020-10-18 DIAGNOSIS — Z794 Long term (current) use of insulin: Secondary | ICD-10-CM | POA: Diagnosis not present

## 2020-10-18 DIAGNOSIS — I5043 Acute on chronic combined systolic (congestive) and diastolic (congestive) heart failure: Secondary | ICD-10-CM | POA: Diagnosis not present

## 2020-10-18 DIAGNOSIS — Z9181 History of falling: Secondary | ICD-10-CM | POA: Diagnosis not present

## 2020-10-18 DIAGNOSIS — D696 Thrombocytopenia, unspecified: Secondary | ICD-10-CM | POA: Diagnosis not present

## 2020-10-19 DIAGNOSIS — I5043 Acute on chronic combined systolic (congestive) and diastolic (congestive) heart failure: Secondary | ICD-10-CM | POA: Diagnosis not present

## 2020-10-19 DIAGNOSIS — I69391 Dysphagia following cerebral infarction: Secondary | ICD-10-CM | POA: Diagnosis not present

## 2020-10-19 DIAGNOSIS — R2689 Other abnormalities of gait and mobility: Secondary | ICD-10-CM | POA: Diagnosis not present

## 2020-10-19 DIAGNOSIS — R278 Other lack of coordination: Secondary | ICD-10-CM | POA: Diagnosis not present

## 2020-10-19 DIAGNOSIS — M6281 Muscle weakness (generalized): Secondary | ICD-10-CM | POA: Diagnosis not present

## 2020-10-19 DIAGNOSIS — I5041 Acute combined systolic (congestive) and diastolic (congestive) heart failure: Secondary | ICD-10-CM | POA: Diagnosis not present

## 2020-10-19 DIAGNOSIS — K219 Gastro-esophageal reflux disease without esophagitis: Secondary | ICD-10-CM | POA: Diagnosis not present

## 2020-10-19 DIAGNOSIS — Z9181 History of falling: Secondary | ICD-10-CM | POA: Diagnosis not present

## 2020-10-19 DIAGNOSIS — R2681 Unsteadiness on feet: Secondary | ICD-10-CM | POA: Diagnosis not present

## 2020-10-19 DIAGNOSIS — Z794 Long term (current) use of insulin: Secondary | ICD-10-CM | POA: Diagnosis not present

## 2020-10-19 DIAGNOSIS — R5381 Other malaise: Secondary | ICD-10-CM | POA: Diagnosis not present

## 2020-10-19 DIAGNOSIS — E118 Type 2 diabetes mellitus with unspecified complications: Secondary | ICD-10-CM | POA: Diagnosis not present

## 2020-10-19 DIAGNOSIS — R1312 Dysphagia, oropharyngeal phase: Secondary | ICD-10-CM | POA: Diagnosis not present

## 2020-10-19 DIAGNOSIS — D696 Thrombocytopenia, unspecified: Secondary | ICD-10-CM | POA: Diagnosis not present

## 2020-10-20 DIAGNOSIS — I69391 Dysphagia following cerebral infarction: Secondary | ICD-10-CM | POA: Diagnosis not present

## 2020-10-20 DIAGNOSIS — E118 Type 2 diabetes mellitus with unspecified complications: Secondary | ICD-10-CM | POA: Diagnosis not present

## 2020-10-20 DIAGNOSIS — R278 Other lack of coordination: Secondary | ICD-10-CM | POA: Diagnosis not present

## 2020-10-20 DIAGNOSIS — Z9181 History of falling: Secondary | ICD-10-CM | POA: Diagnosis not present

## 2020-10-20 DIAGNOSIS — R2689 Other abnormalities of gait and mobility: Secondary | ICD-10-CM | POA: Diagnosis not present

## 2020-10-20 DIAGNOSIS — R1312 Dysphagia, oropharyngeal phase: Secondary | ICD-10-CM | POA: Diagnosis not present

## 2020-10-20 DIAGNOSIS — Z794 Long term (current) use of insulin: Secondary | ICD-10-CM | POA: Diagnosis not present

## 2020-10-20 DIAGNOSIS — I5043 Acute on chronic combined systolic (congestive) and diastolic (congestive) heart failure: Secondary | ICD-10-CM | POA: Diagnosis not present

## 2020-10-20 DIAGNOSIS — M6281 Muscle weakness (generalized): Secondary | ICD-10-CM | POA: Diagnosis not present

## 2020-10-20 DIAGNOSIS — D696 Thrombocytopenia, unspecified: Secondary | ICD-10-CM | POA: Diagnosis not present

## 2020-10-20 DIAGNOSIS — R2681 Unsteadiness on feet: Secondary | ICD-10-CM | POA: Diagnosis not present

## 2020-10-21 DIAGNOSIS — Z9181 History of falling: Secondary | ICD-10-CM | POA: Diagnosis not present

## 2020-10-21 DIAGNOSIS — M6281 Muscle weakness (generalized): Secondary | ICD-10-CM | POA: Diagnosis not present

## 2020-10-21 DIAGNOSIS — E118 Type 2 diabetes mellitus with unspecified complications: Secondary | ICD-10-CM | POA: Diagnosis not present

## 2020-10-21 DIAGNOSIS — I69391 Dysphagia following cerebral infarction: Secondary | ICD-10-CM | POA: Diagnosis not present

## 2020-10-21 DIAGNOSIS — D696 Thrombocytopenia, unspecified: Secondary | ICD-10-CM | POA: Diagnosis not present

## 2020-10-21 DIAGNOSIS — R278 Other lack of coordination: Secondary | ICD-10-CM | POA: Diagnosis not present

## 2020-10-21 DIAGNOSIS — Z794 Long term (current) use of insulin: Secondary | ICD-10-CM | POA: Diagnosis not present

## 2020-10-21 DIAGNOSIS — R2689 Other abnormalities of gait and mobility: Secondary | ICD-10-CM | POA: Diagnosis not present

## 2020-10-21 DIAGNOSIS — R2681 Unsteadiness on feet: Secondary | ICD-10-CM | POA: Diagnosis not present

## 2020-10-21 DIAGNOSIS — I5043 Acute on chronic combined systolic (congestive) and diastolic (congestive) heart failure: Secondary | ICD-10-CM | POA: Diagnosis not present

## 2020-10-21 DIAGNOSIS — R1312 Dysphagia, oropharyngeal phase: Secondary | ICD-10-CM | POA: Diagnosis not present

## 2020-10-22 DIAGNOSIS — I69391 Dysphagia following cerebral infarction: Secondary | ICD-10-CM | POA: Diagnosis not present

## 2020-10-22 DIAGNOSIS — R278 Other lack of coordination: Secondary | ICD-10-CM | POA: Diagnosis not present

## 2020-10-22 DIAGNOSIS — M6281 Muscle weakness (generalized): Secondary | ICD-10-CM | POA: Diagnosis not present

## 2020-10-22 DIAGNOSIS — R2681 Unsteadiness on feet: Secondary | ICD-10-CM | POA: Diagnosis not present

## 2020-10-22 DIAGNOSIS — Z794 Long term (current) use of insulin: Secondary | ICD-10-CM | POA: Diagnosis not present

## 2020-10-22 DIAGNOSIS — D696 Thrombocytopenia, unspecified: Secondary | ICD-10-CM | POA: Diagnosis not present

## 2020-10-22 DIAGNOSIS — R2689 Other abnormalities of gait and mobility: Secondary | ICD-10-CM | POA: Diagnosis not present

## 2020-10-22 DIAGNOSIS — I5043 Acute on chronic combined systolic (congestive) and diastolic (congestive) heart failure: Secondary | ICD-10-CM | POA: Diagnosis not present

## 2020-10-22 DIAGNOSIS — R1312 Dysphagia, oropharyngeal phase: Secondary | ICD-10-CM | POA: Diagnosis not present

## 2020-10-22 DIAGNOSIS — Z9181 History of falling: Secondary | ICD-10-CM | POA: Diagnosis not present

## 2020-10-22 DIAGNOSIS — E118 Type 2 diabetes mellitus with unspecified complications: Secondary | ICD-10-CM | POA: Diagnosis not present

## 2020-10-23 DIAGNOSIS — Z9181 History of falling: Secondary | ICD-10-CM | POA: Diagnosis not present

## 2020-10-23 DIAGNOSIS — Z794 Long term (current) use of insulin: Secondary | ICD-10-CM | POA: Diagnosis not present

## 2020-10-23 DIAGNOSIS — I5043 Acute on chronic combined systolic (congestive) and diastolic (congestive) heart failure: Secondary | ICD-10-CM | POA: Diagnosis not present

## 2020-10-23 DIAGNOSIS — M6281 Muscle weakness (generalized): Secondary | ICD-10-CM | POA: Diagnosis not present

## 2020-10-23 DIAGNOSIS — R2681 Unsteadiness on feet: Secondary | ICD-10-CM | POA: Diagnosis not present

## 2020-10-23 DIAGNOSIS — R1312 Dysphagia, oropharyngeal phase: Secondary | ICD-10-CM | POA: Diagnosis not present

## 2020-10-23 DIAGNOSIS — R2689 Other abnormalities of gait and mobility: Secondary | ICD-10-CM | POA: Diagnosis not present

## 2020-10-23 DIAGNOSIS — I69391 Dysphagia following cerebral infarction: Secondary | ICD-10-CM | POA: Diagnosis not present

## 2020-10-23 DIAGNOSIS — E118 Type 2 diabetes mellitus with unspecified complications: Secondary | ICD-10-CM | POA: Diagnosis not present

## 2020-10-23 DIAGNOSIS — R278 Other lack of coordination: Secondary | ICD-10-CM | POA: Diagnosis not present

## 2020-10-23 DIAGNOSIS — D696 Thrombocytopenia, unspecified: Secondary | ICD-10-CM | POA: Diagnosis not present

## 2020-10-25 DIAGNOSIS — R278 Other lack of coordination: Secondary | ICD-10-CM | POA: Diagnosis not present

## 2020-10-25 DIAGNOSIS — E118 Type 2 diabetes mellitus with unspecified complications: Secondary | ICD-10-CM | POA: Diagnosis not present

## 2020-10-25 DIAGNOSIS — I5043 Acute on chronic combined systolic (congestive) and diastolic (congestive) heart failure: Secondary | ICD-10-CM | POA: Diagnosis not present

## 2020-10-25 DIAGNOSIS — R2681 Unsteadiness on feet: Secondary | ICD-10-CM | POA: Diagnosis not present

## 2020-10-25 DIAGNOSIS — D696 Thrombocytopenia, unspecified: Secondary | ICD-10-CM | POA: Diagnosis not present

## 2020-10-25 DIAGNOSIS — I69391 Dysphagia following cerebral infarction: Secondary | ICD-10-CM | POA: Diagnosis not present

## 2020-10-25 DIAGNOSIS — M6281 Muscle weakness (generalized): Secondary | ICD-10-CM | POA: Diagnosis not present

## 2020-10-25 DIAGNOSIS — R2689 Other abnormalities of gait and mobility: Secondary | ICD-10-CM | POA: Diagnosis not present

## 2020-10-25 DIAGNOSIS — Z9181 History of falling: Secondary | ICD-10-CM | POA: Diagnosis not present

## 2020-10-25 DIAGNOSIS — Z794 Long term (current) use of insulin: Secondary | ICD-10-CM | POA: Diagnosis not present

## 2020-10-26 DIAGNOSIS — D696 Thrombocytopenia, unspecified: Secondary | ICD-10-CM | POA: Diagnosis not present

## 2020-10-26 DIAGNOSIS — I5043 Acute on chronic combined systolic (congestive) and diastolic (congestive) heart failure: Secondary | ICD-10-CM | POA: Diagnosis not present

## 2020-10-26 DIAGNOSIS — M6281 Muscle weakness (generalized): Secondary | ICD-10-CM | POA: Diagnosis not present

## 2020-10-26 DIAGNOSIS — R278 Other lack of coordination: Secondary | ICD-10-CM | POA: Diagnosis not present

## 2020-10-26 DIAGNOSIS — R2689 Other abnormalities of gait and mobility: Secondary | ICD-10-CM | POA: Diagnosis not present

## 2020-10-26 DIAGNOSIS — Z9181 History of falling: Secondary | ICD-10-CM | POA: Diagnosis not present

## 2020-10-26 DIAGNOSIS — I69391 Dysphagia following cerebral infarction: Secondary | ICD-10-CM | POA: Diagnosis not present

## 2020-10-26 DIAGNOSIS — E118 Type 2 diabetes mellitus with unspecified complications: Secondary | ICD-10-CM | POA: Diagnosis not present

## 2020-10-26 DIAGNOSIS — Z794 Long term (current) use of insulin: Secondary | ICD-10-CM | POA: Diagnosis not present

## 2020-10-26 DIAGNOSIS — R2681 Unsteadiness on feet: Secondary | ICD-10-CM | POA: Diagnosis not present

## 2020-10-27 DIAGNOSIS — Z794 Long term (current) use of insulin: Secondary | ICD-10-CM | POA: Diagnosis not present

## 2020-10-27 DIAGNOSIS — R278 Other lack of coordination: Secondary | ICD-10-CM | POA: Diagnosis not present

## 2020-10-27 DIAGNOSIS — M6281 Muscle weakness (generalized): Secondary | ICD-10-CM | POA: Diagnosis not present

## 2020-10-27 DIAGNOSIS — D696 Thrombocytopenia, unspecified: Secondary | ICD-10-CM | POA: Diagnosis not present

## 2020-10-27 DIAGNOSIS — Z9181 History of falling: Secondary | ICD-10-CM | POA: Diagnosis not present

## 2020-10-27 DIAGNOSIS — R2681 Unsteadiness on feet: Secondary | ICD-10-CM | POA: Diagnosis not present

## 2020-10-27 DIAGNOSIS — E118 Type 2 diabetes mellitus with unspecified complications: Secondary | ICD-10-CM | POA: Diagnosis not present

## 2020-10-27 DIAGNOSIS — I69391 Dysphagia following cerebral infarction: Secondary | ICD-10-CM | POA: Diagnosis not present

## 2020-10-27 DIAGNOSIS — R2689 Other abnormalities of gait and mobility: Secondary | ICD-10-CM | POA: Diagnosis not present

## 2020-10-27 DIAGNOSIS — I5043 Acute on chronic combined systolic (congestive) and diastolic (congestive) heart failure: Secondary | ICD-10-CM | POA: Diagnosis not present

## 2020-10-28 DIAGNOSIS — D696 Thrombocytopenia, unspecified: Secondary | ICD-10-CM | POA: Diagnosis not present

## 2020-10-28 DIAGNOSIS — Z9181 History of falling: Secondary | ICD-10-CM | POA: Diagnosis not present

## 2020-10-28 DIAGNOSIS — R278 Other lack of coordination: Secondary | ICD-10-CM | POA: Diagnosis not present

## 2020-10-28 DIAGNOSIS — E118 Type 2 diabetes mellitus with unspecified complications: Secondary | ICD-10-CM | POA: Diagnosis not present

## 2020-10-28 DIAGNOSIS — I69391 Dysphagia following cerebral infarction: Secondary | ICD-10-CM | POA: Diagnosis not present

## 2020-10-28 DIAGNOSIS — M6281 Muscle weakness (generalized): Secondary | ICD-10-CM | POA: Diagnosis not present

## 2020-10-28 DIAGNOSIS — Z794 Long term (current) use of insulin: Secondary | ICD-10-CM | POA: Diagnosis not present

## 2020-10-28 DIAGNOSIS — R2689 Other abnormalities of gait and mobility: Secondary | ICD-10-CM | POA: Diagnosis not present

## 2020-10-28 DIAGNOSIS — R2681 Unsteadiness on feet: Secondary | ICD-10-CM | POA: Diagnosis not present

## 2020-10-28 DIAGNOSIS — I5043 Acute on chronic combined systolic (congestive) and diastolic (congestive) heart failure: Secondary | ICD-10-CM | POA: Diagnosis not present

## 2020-10-29 DIAGNOSIS — Z9181 History of falling: Secondary | ICD-10-CM | POA: Diagnosis not present

## 2020-10-29 DIAGNOSIS — R278 Other lack of coordination: Secondary | ICD-10-CM | POA: Diagnosis not present

## 2020-10-29 DIAGNOSIS — M6281 Muscle weakness (generalized): Secondary | ICD-10-CM | POA: Diagnosis not present

## 2020-10-29 DIAGNOSIS — I5043 Acute on chronic combined systolic (congestive) and diastolic (congestive) heart failure: Secondary | ICD-10-CM | POA: Diagnosis not present

## 2020-10-29 DIAGNOSIS — I69391 Dysphagia following cerebral infarction: Secondary | ICD-10-CM | POA: Diagnosis not present

## 2020-10-29 DIAGNOSIS — R2681 Unsteadiness on feet: Secondary | ICD-10-CM | POA: Diagnosis not present

## 2020-10-29 DIAGNOSIS — R2689 Other abnormalities of gait and mobility: Secondary | ICD-10-CM | POA: Diagnosis not present

## 2020-10-29 DIAGNOSIS — Z794 Long term (current) use of insulin: Secondary | ICD-10-CM | POA: Diagnosis not present

## 2020-10-29 DIAGNOSIS — D696 Thrombocytopenia, unspecified: Secondary | ICD-10-CM | POA: Diagnosis not present

## 2020-10-29 DIAGNOSIS — E118 Type 2 diabetes mellitus with unspecified complications: Secondary | ICD-10-CM | POA: Diagnosis not present

## 2020-10-30 DIAGNOSIS — M6281 Muscle weakness (generalized): Secondary | ICD-10-CM | POA: Diagnosis not present

## 2020-10-30 DIAGNOSIS — R2681 Unsteadiness on feet: Secondary | ICD-10-CM | POA: Diagnosis not present

## 2020-10-30 DIAGNOSIS — D696 Thrombocytopenia, unspecified: Secondary | ICD-10-CM | POA: Diagnosis not present

## 2020-10-30 DIAGNOSIS — R2689 Other abnormalities of gait and mobility: Secondary | ICD-10-CM | POA: Diagnosis not present

## 2020-10-30 DIAGNOSIS — I5043 Acute on chronic combined systolic (congestive) and diastolic (congestive) heart failure: Secondary | ICD-10-CM | POA: Diagnosis not present

## 2020-10-30 DIAGNOSIS — Z9181 History of falling: Secondary | ICD-10-CM | POA: Diagnosis not present

## 2020-10-30 DIAGNOSIS — Z794 Long term (current) use of insulin: Secondary | ICD-10-CM | POA: Diagnosis not present

## 2020-10-30 DIAGNOSIS — R278 Other lack of coordination: Secondary | ICD-10-CM | POA: Diagnosis not present

## 2020-10-30 DIAGNOSIS — E118 Type 2 diabetes mellitus with unspecified complications: Secondary | ICD-10-CM | POA: Diagnosis not present

## 2020-10-30 DIAGNOSIS — I69391 Dysphagia following cerebral infarction: Secondary | ICD-10-CM | POA: Diagnosis not present

## 2020-10-31 DIAGNOSIS — Z794 Long term (current) use of insulin: Secondary | ICD-10-CM | POA: Diagnosis not present

## 2020-10-31 DIAGNOSIS — D696 Thrombocytopenia, unspecified: Secondary | ICD-10-CM | POA: Diagnosis not present

## 2020-10-31 DIAGNOSIS — I5043 Acute on chronic combined systolic (congestive) and diastolic (congestive) heart failure: Secondary | ICD-10-CM | POA: Diagnosis not present

## 2020-10-31 DIAGNOSIS — R2681 Unsteadiness on feet: Secondary | ICD-10-CM | POA: Diagnosis not present

## 2020-10-31 DIAGNOSIS — R278 Other lack of coordination: Secondary | ICD-10-CM | POA: Diagnosis not present

## 2020-10-31 DIAGNOSIS — Z9181 History of falling: Secondary | ICD-10-CM | POA: Diagnosis not present

## 2020-10-31 DIAGNOSIS — R2689 Other abnormalities of gait and mobility: Secondary | ICD-10-CM | POA: Diagnosis not present

## 2020-10-31 DIAGNOSIS — M6281 Muscle weakness (generalized): Secondary | ICD-10-CM | POA: Diagnosis not present

## 2020-10-31 DIAGNOSIS — I69391 Dysphagia following cerebral infarction: Secondary | ICD-10-CM | POA: Diagnosis not present

## 2020-10-31 DIAGNOSIS — E118 Type 2 diabetes mellitus with unspecified complications: Secondary | ICD-10-CM | POA: Diagnosis not present

## 2020-11-01 DIAGNOSIS — R2689 Other abnormalities of gait and mobility: Secondary | ICD-10-CM | POA: Diagnosis not present

## 2020-11-01 DIAGNOSIS — I5043 Acute on chronic combined systolic (congestive) and diastolic (congestive) heart failure: Secondary | ICD-10-CM | POA: Diagnosis not present

## 2020-11-01 DIAGNOSIS — Z794 Long term (current) use of insulin: Secondary | ICD-10-CM | POA: Diagnosis not present

## 2020-11-01 DIAGNOSIS — M6281 Muscle weakness (generalized): Secondary | ICD-10-CM | POA: Diagnosis not present

## 2020-11-01 DIAGNOSIS — R2681 Unsteadiness on feet: Secondary | ICD-10-CM | POA: Diagnosis not present

## 2020-11-01 DIAGNOSIS — I69391 Dysphagia following cerebral infarction: Secondary | ICD-10-CM | POA: Diagnosis not present

## 2020-11-01 DIAGNOSIS — D696 Thrombocytopenia, unspecified: Secondary | ICD-10-CM | POA: Diagnosis not present

## 2020-11-01 DIAGNOSIS — Z9181 History of falling: Secondary | ICD-10-CM | POA: Diagnosis not present

## 2020-11-01 DIAGNOSIS — R278 Other lack of coordination: Secondary | ICD-10-CM | POA: Diagnosis not present

## 2020-11-01 DIAGNOSIS — E118 Type 2 diabetes mellitus with unspecified complications: Secondary | ICD-10-CM | POA: Diagnosis not present

## 2020-11-02 DIAGNOSIS — R2681 Unsteadiness on feet: Secondary | ICD-10-CM | POA: Diagnosis not present

## 2020-11-02 DIAGNOSIS — M6281 Muscle weakness (generalized): Secondary | ICD-10-CM | POA: Diagnosis not present

## 2020-11-02 DIAGNOSIS — Z9181 History of falling: Secondary | ICD-10-CM | POA: Diagnosis not present

## 2020-11-02 DIAGNOSIS — Z794 Long term (current) use of insulin: Secondary | ICD-10-CM | POA: Diagnosis not present

## 2020-11-02 DIAGNOSIS — R278 Other lack of coordination: Secondary | ICD-10-CM | POA: Diagnosis not present

## 2020-11-02 DIAGNOSIS — I69391 Dysphagia following cerebral infarction: Secondary | ICD-10-CM | POA: Diagnosis not present

## 2020-11-02 DIAGNOSIS — R2689 Other abnormalities of gait and mobility: Secondary | ICD-10-CM | POA: Diagnosis not present

## 2020-11-02 DIAGNOSIS — I5043 Acute on chronic combined systolic (congestive) and diastolic (congestive) heart failure: Secondary | ICD-10-CM | POA: Diagnosis not present

## 2020-11-02 DIAGNOSIS — E118 Type 2 diabetes mellitus with unspecified complications: Secondary | ICD-10-CM | POA: Diagnosis not present

## 2020-11-02 DIAGNOSIS — D696 Thrombocytopenia, unspecified: Secondary | ICD-10-CM | POA: Diagnosis not present

## 2020-11-03 DIAGNOSIS — M6281 Muscle weakness (generalized): Secondary | ICD-10-CM | POA: Diagnosis not present

## 2020-11-03 DIAGNOSIS — I5043 Acute on chronic combined systolic (congestive) and diastolic (congestive) heart failure: Secondary | ICD-10-CM | POA: Diagnosis not present

## 2020-11-03 DIAGNOSIS — R2689 Other abnormalities of gait and mobility: Secondary | ICD-10-CM | POA: Diagnosis not present

## 2020-11-03 DIAGNOSIS — Z9181 History of falling: Secondary | ICD-10-CM | POA: Diagnosis not present

## 2020-11-03 DIAGNOSIS — D696 Thrombocytopenia, unspecified: Secondary | ICD-10-CM | POA: Diagnosis not present

## 2020-11-03 DIAGNOSIS — Z794 Long term (current) use of insulin: Secondary | ICD-10-CM | POA: Diagnosis not present

## 2020-11-03 DIAGNOSIS — E118 Type 2 diabetes mellitus with unspecified complications: Secondary | ICD-10-CM | POA: Diagnosis not present

## 2020-11-03 DIAGNOSIS — R278 Other lack of coordination: Secondary | ICD-10-CM | POA: Diagnosis not present

## 2020-11-03 DIAGNOSIS — R2681 Unsteadiness on feet: Secondary | ICD-10-CM | POA: Diagnosis not present

## 2020-11-03 DIAGNOSIS — I69391 Dysphagia following cerebral infarction: Secondary | ICD-10-CM | POA: Diagnosis not present

## 2020-11-04 DIAGNOSIS — R2681 Unsteadiness on feet: Secondary | ICD-10-CM | POA: Diagnosis not present

## 2020-11-04 DIAGNOSIS — E118 Type 2 diabetes mellitus with unspecified complications: Secondary | ICD-10-CM | POA: Diagnosis not present

## 2020-11-04 DIAGNOSIS — R278 Other lack of coordination: Secondary | ICD-10-CM | POA: Diagnosis not present

## 2020-11-04 DIAGNOSIS — I69391 Dysphagia following cerebral infarction: Secondary | ICD-10-CM | POA: Diagnosis not present

## 2020-11-04 DIAGNOSIS — Z794 Long term (current) use of insulin: Secondary | ICD-10-CM | POA: Diagnosis not present

## 2020-11-04 DIAGNOSIS — M6281 Muscle weakness (generalized): Secondary | ICD-10-CM | POA: Diagnosis not present

## 2020-11-04 DIAGNOSIS — R945 Abnormal results of liver function studies: Secondary | ICD-10-CM | POA: Diagnosis not present

## 2020-11-04 DIAGNOSIS — I5043 Acute on chronic combined systolic (congestive) and diastolic (congestive) heart failure: Secondary | ICD-10-CM | POA: Diagnosis not present

## 2020-11-04 DIAGNOSIS — R7989 Other specified abnormal findings of blood chemistry: Secondary | ICD-10-CM | POA: Diagnosis not present

## 2020-11-04 DIAGNOSIS — R7309 Other abnormal glucose: Secondary | ICD-10-CM | POA: Diagnosis not present

## 2020-11-04 DIAGNOSIS — R2689 Other abnormalities of gait and mobility: Secondary | ICD-10-CM | POA: Diagnosis not present

## 2020-11-04 DIAGNOSIS — D696 Thrombocytopenia, unspecified: Secondary | ICD-10-CM | POA: Diagnosis not present

## 2020-11-04 DIAGNOSIS — Z9181 History of falling: Secondary | ICD-10-CM | POA: Diagnosis not present

## 2020-11-05 ENCOUNTER — Ambulatory Visit (INDEPENDENT_AMBULATORY_CARE_PROVIDER_SITE_OTHER): Payer: Medicare Other

## 2020-11-05 DIAGNOSIS — R55 Syncope and collapse: Secondary | ICD-10-CM | POA: Diagnosis not present

## 2020-11-05 LAB — CUP PACEART REMOTE DEVICE CHECK
Battery Remaining Percentage: 85 %
Brady Statistic RA Percent Paced: 8 %
Brady Statistic RV Percent Paced: 97 %
Date Time Interrogation Session: 20220113101809
Implantable Lead Implant Date: 20200624
Implantable Lead Implant Date: 20200624
Implantable Lead Location: 753859
Implantable Lead Location: 753860
Implantable Lead Model: 377
Implantable Lead Model: 377
Implantable Lead Serial Number: 81195510
Implantable Pulse Generator Implant Date: 20200624
Lead Channel Impedance Value: 390 Ohm
Lead Channel Impedance Value: 624 Ohm
Lead Channel Sensing Intrinsic Amplitude: 0.7 mV
Lead Channel Sensing Intrinsic Amplitude: 15 mV
Lead Channel Setting Pacing Amplitude: 2.4 V
Lead Channel Setting Pacing Amplitude: 2.4 V
Lead Channel Setting Pacing Pulse Width: 0.4 ms
Pulse Gen Model: 407145
Pulse Gen Serial Number: 69638782

## 2020-11-10 NOTE — Progress Notes (Unsigned)
Cardiology Office Note   Date:  11/12/2020   ID:  Stephanie, [name] 1933/01/19, MRN [id number]  PCP:  [name], MD    No chief complaint on file.  Chronic diastolic heart failure  Wt Readings from Last 3 Encounters:  11/12/20 183 lb 6.4 oz (83.2 kg)  10/13/20 182 lb 5.1 oz (82.7 kg)  08/04/20 182 lb (82.6 kg)       History of Present Illness: [name] is a 87 y.o. female    who has had diastolic dysfunction in the past. She is on Coumadin for DVT. She has had atrial arrhythmias and chronic LE edema.   In addition to thehistory of h/o DVT (on Coumadin for this), chronic diastolic CHF, she also has deconditioning, obesity, HTN, SVT (correlated with pre-syncope, on amiodarone), DM, chronic anemia/thrombocytopenia, LBBB, probable CKD stage III (Cr 1.3 in 08/2014), mod MR by echo 2014 who presents for f/u. Per notes in 2013 she had an EPS with ablation for SVT which did not eliminate the SVT completely. She also had bradycardia which limited medication. She was placed on amiodarone by Dr. Lovena Le. Last echo 07/2013: mild LVH, moderate focal basal hypertrophy of septum, EF 55-60%, grade 1 DD, mod MR, mod LAE, PASP 40.  She sustained a mechanical fall in 07/2015 and was in the hospital for a week.  She was seen in [facility] 2017 for fluid overload. Lasix was increased and potassium was increased.  In 2018, therewas a concern regarding stroke as she developed some stuttering and swallowing difficulties. Her workup has been unrevealing at this time. Wecheckedcarotid Doppler and echocardiogram.  Prior records show: "She followed with nephrology and cardiology in [place] and also has established care with [facility] as well. There are records in [place] but these only appear to be patient discharge instructions rather than provider notes.  She was admitted 06/08/2019 with shortness of breath and arm pain, found to have acute on chronic diastolic CHF requiring  diuresis. Her troponin was elevated to a peak of 310, felt due to demand ischemia in absence of true angina. Left arm pain was felt MSK, supported by plain films.No further ischemic workup was recommended.Age adjusted D-dimer was felt wnl. Per Dr. Lovena Le, device appeared to be working normally and did not need it interrogated during admission but routine EPf/u wasrecommended. 2D echo 06/09/19 showed EF 50-55%, impaired relaxation, akinetic apex,mildly reduced RV function, severe LAE, moderate RAE, small pericardial effusion, circular mass attached to pacemaker lead in right atrium. Per Dr. Francesca Oman note, "In the absence of fever or sign of sepsis I would not pursue further work-up at this point given the patient age and comorbidities. I would consider TEE once any signs of infection." She recommended to restart oral Bumex after a 1 day holiday from diuresis. F/u Crwas still elevated on f/u labs 8/31 so lisinopril was discontinued. F/u BMET 06/28/19 showed K 4.5, Cr 1.87 (previous range variable from 1.4-2.3). Otherwise recent labs 05/2019 showed Hgb 8.9 (variable in 9-10 range this year), Plt 105 (variable in 2020), WBC 2.4, A1C 7.9, 07/2018 LDL 96.  She had a pacer placed in [place], and has f/u in our PM clinic scheduled.There was a concern for a lead infection.  She was seen by Dr. Lovena Le.  Pacer was functioning normally.  Due to lack of symptoms of infection, no TEE was done.   She was diagnosed with breast cancer and the plan was for her to have surgery in [place].  Ultimately,  she did not have surgery and is being treated with anastrozole.  There was a concern about putting her to sleep.   She has ben bothered by arthritis in her right shoulder.   Records from 12/21 hospital admission show: " Covid 19 infection on 06/12/2020 comes into the hospital for shortness of breath that started 3 days prior to admission, in the ED he was found to have a temperature of 100.5 breathing 20 times per minute  with a white count of 4 hemoglobin of 10 lactic acid of 1.4 chest x-ray showed new infiltrates in both lung fields influenza and SARS-CoV-2 PCR were negative, he was started on IV Lasix Rocephin and doxy. Empiric antibiotics were discontinued she was started on aggressive IV diuresis she is diuresed about 3 L and her breathing is improving along with her weight which is coming down."  1/22 pacer check showed: "Alert for AF burden 100% on 11/12/20, Elevated to > 255 since 09/30/20. Patient just d/ced from hospital for CHF exacerbation 10/13/20. Spoke with [name](daughter) per DPR, and she reports patient has been eating high sodium foods and drinking increased amount o liquids sine returning to long term care facility. She reports increased BLE edema. Daughter concerned that nursing facility does not have patient on fluid restriction or low NA diet."    Recently she has been doing well.  Wants to have her orders allow for 1 extra torsemide pill, 20 mg as needed for swelling.  Denies : Chest pain. Dizziness.  Nitroglycerin use. Orthopnea. Palpitations. Paroxysmal nocturnal dyspnea. Syncope.      Past Medical History:  Diagnosis Date  . Abnormal echocardiogram    a. possible mass on echo 05/2019 on PPM, b. no evidence of mass / atrial lead vegetation on follow up echo 06/2019  . Anemia   . Arthritis   . Breast cancer ([place]) 10/2018   Invasive ductal carcinoma  . Cancer ([place])   . Cataract   . Chronic combined systolic and diastolic CHF (congestive heart failure) ([place])    a. Previously diastolic but patient AVS from outside hospital listed systolic CHF and cardiomyopathy, records pending.  . CKD (chronic kidney disease), stage III ([place])   . Clotting disorder ([place])   . CVA (cerebral vascular accident) ([place])    residual right sided weakness and mild dysphagia  . Diabetes mellitus ([place])   . Dizziness and giddiness   . DVT (deep venous thrombosis) ([facility])    a. on anticoagulation for this.  .  Essential hypertension   . LBBB (left bundle branch block)   . Mitral regurgitation    a. Mod MR by echo 2014.  . Muscular deconditioning   . Obesity   . Pacemaker   . Pericardial effusion   . SVT (supraventricular tachycardia) ([place])    a. In 2013 she had an EPS with ablation for SVT which did not eliminate the SVT completely. She also had bradycardia which limited medication. She was placed on amiodarone by Dr. Lovena Le.  . Thrombocytopenia ([place]) 11/21/2011    Past Surgical History:  Procedure Laterality Date  . EYE SURGERY    . SUPRAVENTRICULAR TACHYCARDIA ABLATION N/A 10/11/2012   Procedure: SUPRAVENTRICULAR TACHYCARDIA ABLATION;  Surgeon: Evans Lance, MD;  Location[facility] CATH LAB;  Service: Cardiovascular;  Laterality: N/A;     Current Outpatient Medications  Medication Sig Dispense Refill  . amiodarone (PACERONE) 200 MG tablet Take one tablet by mouth daily Monday-Saturday. Do NOT take Sunday 90 tablet 3  . anastrozole (ARIMIDEX) 1  MG tablet TAKE 1 TABLET(1 MG) BY MOUTH DAILY 90 tablet 0  . atorvastatin (LIPITOR) 20 MG tablet TAKE 1 TABLET(20 MG) BY MOUTH AT BEDTIME 90 tablet 2  . diclofenac sodium (VOLTAREN) 1 % GEL Apply 2 g topically 4 (four) times daily. 100 g 1  . Docusate Sodium 100 MG capsule Take 100 mg by mouth daily.    [name] ELIQUIS 2.5 MG TABS tablet TAKE 1 TABLET(2.5 MG) BY MOUTH TWICE DAILY 60 tablet 2  . ferrous sulfate 325 (65 FE) MG tablet Take 325 mg by mouth daily with breakfast.    . HUMALOG KWIKPEN 100 UNIT/ML KwikPen Inject 0-12 Units into the skin See admin instructions. Inject 0-12 units subcutaneously 3 times daily per sliding scale: BG 0-150 = 0 units BG 151-200 = 2 units BG 201-250 = 4 units BG 251-300 = 6 units BG 301-350 = 8 units BG 351-400 = 10 units BG 401+ = 12 units    . insulin glargine (LANTUS) 100 unit/mL SOPN Inject 15 Units into the skin at bedtime.    [name] lisinopril (ZESTRIL) 5 MG tablet Take 1 tablet (5 mg total) by mouth daily. 30 tablet 0  .  metoprolol succinate (TOPROL-XL) 25 MG 24 hr tablet Take 1 tablet (25 mg total) by mouth daily. 30 tablet 0  . mirtazapine (REMERON) 7.5 MG tablet TAKE 1 TABLET(7.5 MG) BY MOUTH AT BEDTIME 90 tablet 1  . omeprazole (PRILOSEC) 20 MG capsule Take 20 mg by mouth See admin instructions. Take 1 tablet by mouth at 6AM, 8AM, and 4PM.    . oxybutynin (DITROPAN) 5 MG tablet TAKE 1 TABLET(5 MG) BY MOUTH TWICE DAILY 180 tablet 1  . pregabalin (LYRICA) 75 MG capsule Take 1 capsule (75 mg total) by mouth at bedtime. 90 capsule 1  . torsemide (DEMADEX) 20 MG tablet Take 1 tablet (20 mg total) by mouth 2 (two) times daily. You may take an extra 20 mg tablet daily as needed for swelling 30 tablet 0   No current facility-administered medications for this visit.    Allergies:   Aspirin and Penicillins    Social History:  The patient  reports that she has never smoked. She has never used smokeless tobacco. She reports that she does not drink alcohol and does not use drugs.   Family History:  The patient's family history includes Breast cancer in her maternal aunt; Cancer in her father; Heart attack in her brother; Hypertension in her brother, daughter, and sister; Stroke in her mother and sister.    ROS:  Please see the history of present illness.   Otherwise, review of systems are positive for intermittent fluid overload.   All other systems are reviewed and negative.    PHYSICAL EXAM: VS:  BP 120/90   Pulse 65   Ht 5\' 5"  (1.651 m)   Wt 183 lb 6.4 oz (83.2 kg)   SpO2 95%   BMI 30.52 kg/m  , BMI Body mass index is 30.52 kg/m. GEN: Well nourished, well developed, in no acute distress ; slurred speech HEENT: normal  Neck: no JVD, carotid bruits, or masses Cardiac: irregularly irregular, rate controlled; no murmurs, rubs, or gallops,; mild LE edema bilaterally Respiratory:  clear to auscultation bilaterally, normal work of breathing GI: soft, nontender, nondistended, + BS MS: no deformity or atrophy   Skin: warm and dry, no rash Neuro:  Strength and sensation are intact, as above Psych: euthymic mood, full affect   EKG:   The ekg ordered 12/21  demonstrates AFib, LBBB   Recent Labs: 07/14/2020: TSH 1.099 07/20/2020: Magnesium 1.8 10/08/2020: ALT 56; B Natriuretic Peptide 2,506.1; Hemoglobin 10.7; Platelets 113 10/13/2020: BUN 21; Creatinine, Ser 1.43; Potassium 3.8; Sodium 145   Lipid Panel    Component Value Date/Time   CHOL 195 08/15/2018 1236   TRIG 56 01/02/2019 1425   HDL 81 08/15/2018 1236   CHOLHDL 2.4 08/15/2018 1236   CHOLHDL 2.6 12/11/2008 0130   VLDL 13 12/11/2008 0130   LDLCALC 96 08/15/2018 1236     Other studies Reviewed: Additional studies/ records that were reviewed today with results demonstrating: hospital records reviewed.   ASSESSMENT AND PLAN:  1. Chronic diastolic heart failure: Low salt diet.  Continue torsemide 20 mg BID with flexibility to take extra 20 mg as needed daily- usually, she would take twice a week.  2. Hypertensive heart disease: The current medical regimen is effective;  continue present plan and medications. 3. Anticoagulated: No bleeding problems. Low dose Eliquis given age and CRI. 4.   Lower extremity edema: use torsemide.  OK to take extra 20 mg as needed.  Order written for this.  Pacemaker: followed with pacer  Daughter had a shoulder injury so now the patient is at a nursing facility.  Current medicines are reviewed at length with the patient today.  The patient concerns regarding her medicines were addressed.  The following changes have been made:  No change  Labs/ tests ordered today include:  No orders of the defined types were placed in this encounter.   Recommend 150 minutes/week of aerobic exercise Low fat, low carb, high fiber diet recommended  Disposition:   FU in 6 months   Signed, Larae Grooms, MD  11/12/2020 11:05 AM    [facility] [address], [place], [place]   [address] Phone: [phone]; Fax: [phone]

## 2020-11-11 ENCOUNTER — Ambulatory Visit: Payer: Medicare Other | Admitting: Interventional Cardiology

## 2020-11-12 ENCOUNTER — Ambulatory Visit (INDEPENDENT_AMBULATORY_CARE_PROVIDER_SITE_OTHER): Payer: Medicare Other | Admitting: Interventional Cardiology

## 2020-11-12 ENCOUNTER — Telehealth: Payer: Self-pay | Admitting: Emergency Medicine

## 2020-11-12 ENCOUNTER — Encounter: Payer: Self-pay | Admitting: Interventional Cardiology

## 2020-11-12 ENCOUNTER — Other Ambulatory Visit: Payer: Self-pay

## 2020-11-12 VITALS — BP 120/90 | HR 65 | Ht 65.0 in | Wt 183.4 lb

## 2020-11-12 DIAGNOSIS — I5032 Chronic diastolic (congestive) heart failure: Secondary | ICD-10-CM | POA: Diagnosis not present

## 2020-11-12 DIAGNOSIS — Z7901 Long term (current) use of anticoagulants: Secondary | ICD-10-CM

## 2020-11-12 DIAGNOSIS — I4821 Permanent atrial fibrillation: Secondary | ICD-10-CM | POA: Diagnosis not present

## 2020-11-12 DIAGNOSIS — Z13228 Encounter for screening for other metabolic disorders: Secondary | ICD-10-CM | POA: Diagnosis not present

## 2020-11-12 DIAGNOSIS — R600 Localized edema: Secondary | ICD-10-CM

## 2020-11-12 NOTE — Telephone Encounter (Signed)
Alert for AF burden 100% on 11/12/20, Elevated to > 255 since 09/30/20. Patient just d/ced from hospital for CHF exacerbation 10/13/20. Spoke with [name](daughter) per DPR, and she reports patient has been eating high sodium foods and drinking increased amount o liquids sine returning to long term care facility. She reports increased BLE edema. Daughter concerned that nursing facility does not have patient on fluid restriction or low NA diet. Patient to be at appointment with cardiologist today. Unable to reach patient to confirm appointment. ver

## 2020-11-12 NOTE — Telephone Encounter (Signed)
Patient see in the office today.

## 2020-11-12 NOTE — Patient Instructions (Signed)
Medication Instructions:  Continue torsemide 20mg  twice daily; OK to take an additional dose (20mg  torsemide) daily as needed for fluid overload. *If you need a refill on your cardiac medications before your next appointment, please call your pharmacy*   Lab Work: NONE    Testing/Procedures: NONE   Follow-Up: At [facility], you and your health needs are our priority.  As part of our continuing mission to provide you with exceptional heart care, we have created designated Provider Care Teams.  These Care Teams include your primary Cardiologist (physician) and Advanced Practice Providers (APPs -  Physician Assistants and Nurse Practitioners) who all work together to provide you with the care you need, when you need it.  We recommend signing up for the patient portal called "MyChart".  Sign up information is provided on this After Visit Summary.  MyChart is used to connect with patients for Virtual Visits (Telemedicine).  Patients are able to view lab/test results, encounter notes, upcoming appointments, etc.  Non-urgent messages can be sent to your provider as well.   To learn more about what you can do with MyChart, go to [email or web address].    Your next appointment:   6 month(s)  The format for your next appointment:   In Person  Provider:   You may see Larae Grooms, MD or one of the following Advanced Practice Providers on your designated Care Team:    Melina Copa, PA-C  Ermalinda Barrios, PA-C    Other Instructions  Low-Sodium Eating Plan Sodium, which is an element that makes up salt, helps you maintain a healthy balance of fluids in your body. Too much sodium can increase your blood pressure and cause fluid and waste to be held in your body. Your health care provider or dietitian may recommend following this plan if you have high blood pressure (hypertension), kidney disease, liver disease, or heart failure. Eating less sodium can help lower your blood pressure,  reduce swelling, and protect your heart, liver, and kidneys. What are tips for following this plan? Reading food labels  The Nutrition Facts label lists the amount of sodium in one serving of the food. If you eat more than one serving, you must multiply the listed amount of sodium by the number of servings.  Choose foods with less than 140 mg of sodium per serving.  Avoid foods with 300 mg of sodium or more per serving. Shopping  Look for lower-sodium products, often labeled as "low-sodium" or "no salt added."  Always check the sodium content, even if foods are labeled as "unsalted" or "no salt added."  Buy fresh foods. ? Avoid canned foods and pre-made or frozen meals. ? Avoid canned, cured, or processed meats.  Buy breads that have less than 80 mg of sodium per slice.   Cooking  Eat more home-cooked food and less restaurant, buffet, and fast food.  Avoid adding salt when cooking. Use salt-free seasonings or herbs instead of table salt or sea salt. Check with your health care provider or pharmacist before using salt substitutes.  Cook with plant-based oils, such as canola, sunflower, or olive oil.   Meal planning  When eating at a restaurant, ask that your food be prepared with less salt or no salt, if possible. Avoid dishes labeled as brined, pickled, cured, smoked, or made with soy sauce, miso, or teriyaki sauce.  Avoid foods that contain MSG (monosodium glutamate). MSG is sometimes added to [place] food, bouillon, and some canned foods.  Make meals that can be grilled, baked,  poached, roasted, or steamed. These are generally made with less sodium. General information Most people on this plan should limit their sodium intake to 1,500-2,000 mg (milligrams) of sodium each day. What foods should I eat? Fruits Fresh, frozen, or canned fruit. Fruit juice. Vegetables Fresh or frozen vegetables. "No salt added" canned vegetables. "No salt added" tomato sauce and paste. Low-sodium  or reduced-sodium tomato and vegetable juice. Grains Low-sodium cereals, including oats, puffed wheat and rice, and shredded wheat. Low-sodium crackers. Unsalted rice. Unsalted pasta. Low-sodium bread. Whole-grain breads and whole-grain pasta. Meats and other proteins Fresh or frozen (no salt added) meat, poultry, seafood, and fish. Low-sodium canned tuna and salmon. Unsalted nuts. Dried peas, beans, and lentils without added salt. Unsalted canned beans. Eggs. Unsalted nut butters. Dairy Milk. Soy milk. Cheese that is naturally low in sodium, such as ricotta cheese, fresh mozzarella, or Swiss cheese. Low-sodium or reduced-sodium cheese. Cream cheese. Yogurt. Seasonings and condiments Fresh and dried herbs and spices. Salt-free seasonings. Low-sodium mustard and ketchup. Sodium-free salad dressing. Sodium-free light mayonnaise. Fresh or refrigerated horseradish. Lemon juice. Vinegar. Other foods Homemade, reduced-sodium, or low-sodium soups. Unsalted popcorn and pretzels. Low-salt or salt-free chips. The items listed above may not be a complete list of foods and beverages you can eat. Contact a dietitian for more information. What foods should I avoid? Vegetables Sauerkraut, pickled vegetables, and relishes. Olives. [place] fries. Onion rings. Regular canned vegetables (not low-sodium or reduced-sodium). Regular canned tomato sauce and paste (not low-sodium or reduced-sodium). Regular tomato and vegetable juice (not low-sodium or reduced-sodium). Frozen vegetables in sauces. Grains Instant hot cereals. Bread stuffing, pancake, and biscuit mixes. Croutons. Seasoned rice or pasta mixes. Noodle soup cups. Boxed or frozen macaroni and cheese. Regular salted crackers. Self-rising flour. Meats and other proteins Meat or fish that is salted, canned, smoked, spiced, or pickled. Precooked or cured meat, such as sausages or meat loaves. Berniece Salines. Ham. Pepperoni. Hot dogs. Corned beef. Chipped beef. Salt pork.  Jerky. Pickled herring. Anchovies and sardines. Regular canned tuna. Salted nuts. Dairy Processed cheese and cheese spreads. Hard cheeses. Cheese curds. Blue cheese. Feta cheese. String cheese. Regular cottage cheese. Buttermilk. Canned milk. Fats and oils Salted butter. Regular margarine. Ghee. Bacon fat. Seasonings and condiments Onion salt, garlic salt, seasoned salt, table salt, and sea salt. Canned and packaged gravies. Worcestershire sauce. Tartar sauce. Barbecue sauce. Teriyaki sauce. Soy sauce, including reduced-sodium. Steak sauce. Fish sauce. Oyster sauce. Cocktail sauce. Horseradish that you find on the shelf. Regular ketchup and mustard. Meat flavorings and tenderizers. Bouillon cubes. Hot sauce. Pre-made or packaged marinades. Pre-made or packaged taco seasonings. Relishes. Regular salad dressings. Salsa. Other foods Salted popcorn and pretzels. Corn chips and puffs. Potato and tortilla chips. Canned or dried soups. Pizza. Frozen entrees and pot pies. The items listed above may not be a complete list of foods and beverages you should avoid. Contact a dietitian for more information. Summary  Eating less sodium can help lower your blood pressure, reduce swelling, and protect your heart, liver, and kidneys.  Most people on this plan should limit their sodium intake to 1,500-2,000 mg (milligrams) of sodium each day.  Canned, boxed, and frozen foods are high in sodium. Restaurant foods, fast foods, and pizza are also very high in sodium. You also get sodium by adding salt to food.  Try to cook at home, eat more fresh fruits and vegetables, and eat less fast food and canned, processed, or prepared foods. This information is not intended to replace advice given  to you by your health care provider. Make sure you discuss any questions you have with your health care provider. Document Revised: 11/15/2019 Document Reviewed: 09/11/2019 Elsevier Patient Education  2021 [employer].

## 2020-11-13 DIAGNOSIS — I5032 Chronic diastolic (congestive) heart failure: Secondary | ICD-10-CM | POA: Diagnosis not present

## 2020-11-13 DIAGNOSIS — I480 Paroxysmal atrial fibrillation: Secondary | ICD-10-CM | POA: Diagnosis not present

## 2020-11-13 DIAGNOSIS — E1165 Type 2 diabetes mellitus with hyperglycemia: Secondary | ICD-10-CM | POA: Diagnosis not present

## 2020-11-13 DIAGNOSIS — I10 Essential (primary) hypertension: Secondary | ICD-10-CM | POA: Diagnosis not present

## 2020-11-13 DIAGNOSIS — N189 Chronic kidney disease, unspecified: Secondary | ICD-10-CM | POA: Diagnosis not present

## 2020-11-16 DIAGNOSIS — I82409 Acute embolism and thrombosis of unspecified deep veins of unspecified lower extremity: Secondary | ICD-10-CM | POA: Diagnosis not present

## 2020-11-16 DIAGNOSIS — I5032 Chronic diastolic (congestive) heart failure: Secondary | ICD-10-CM | POA: Diagnosis not present

## 2020-11-16 DIAGNOSIS — E1165 Type 2 diabetes mellitus with hyperglycemia: Secondary | ICD-10-CM | POA: Diagnosis not present

## 2020-11-16 DIAGNOSIS — M160 Bilateral primary osteoarthritis of hip: Secondary | ICD-10-CM | POA: Diagnosis not present

## 2020-11-16 DIAGNOSIS — I480 Paroxysmal atrial fibrillation: Secondary | ICD-10-CM | POA: Diagnosis not present

## 2020-11-19 DIAGNOSIS — Z13228 Encounter for screening for other metabolic disorders: Secondary | ICD-10-CM | POA: Diagnosis not present

## 2020-11-19 NOTE — Progress Notes (Signed)
Remote pacemaker transmission.   

## 2020-11-20 DIAGNOSIS — N189 Chronic kidney disease, unspecified: Secondary | ICD-10-CM | POA: Diagnosis not present

## 2020-11-20 DIAGNOSIS — E1165 Type 2 diabetes mellitus with hyperglycemia: Secondary | ICD-10-CM | POA: Diagnosis not present

## 2020-11-20 DIAGNOSIS — I5032 Chronic diastolic (congestive) heart failure: Secondary | ICD-10-CM | POA: Diagnosis not present

## 2020-11-20 DIAGNOSIS — I480 Paroxysmal atrial fibrillation: Secondary | ICD-10-CM | POA: Diagnosis not present

## 2020-12-23 ENCOUNTER — Ambulatory Visit: Payer: Medicare Other | Admitting: Podiatry

## 2021-01-19 IMAGING — US US BREAST BX W LOC DEV 1ST LESION IMG BX SPEC US GUIDE*R*
1 series · 12 of 12 positions shown · non-contrast
Comparison: Previous exam(s).

Addendum:
CLINICAL DATA: Right breast mass.

EXAM:
ULTRASOUND GUIDED RIGHT BREAST CORE NEEDLE BIOPSY

[Series 1: us breast bx w loc dev 1st lesion img bx spec us g · 0.07mm/px · 12 of 12 slices shown]
[im 1/12]
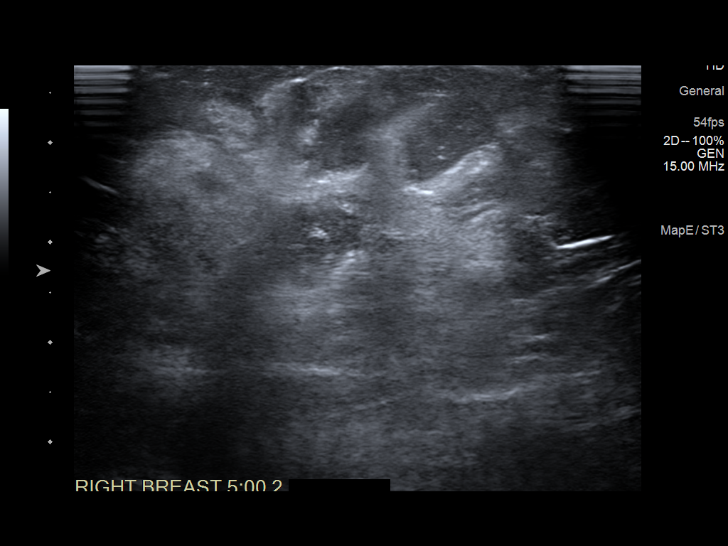
[im 2/12]
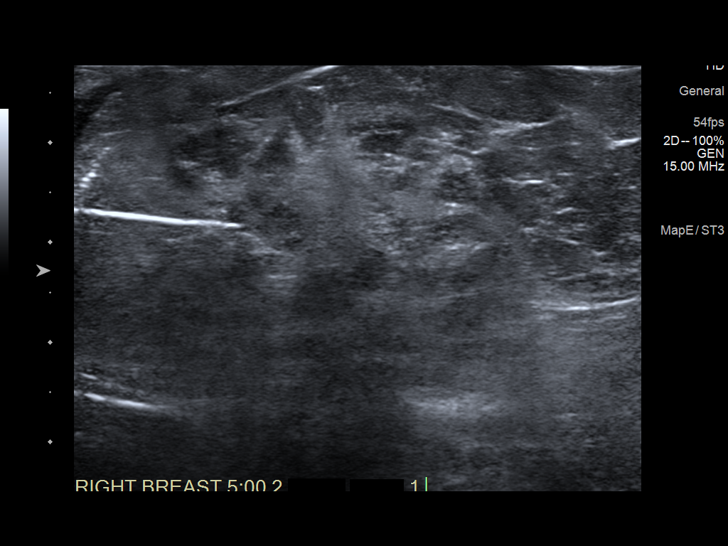
[im 3/12]
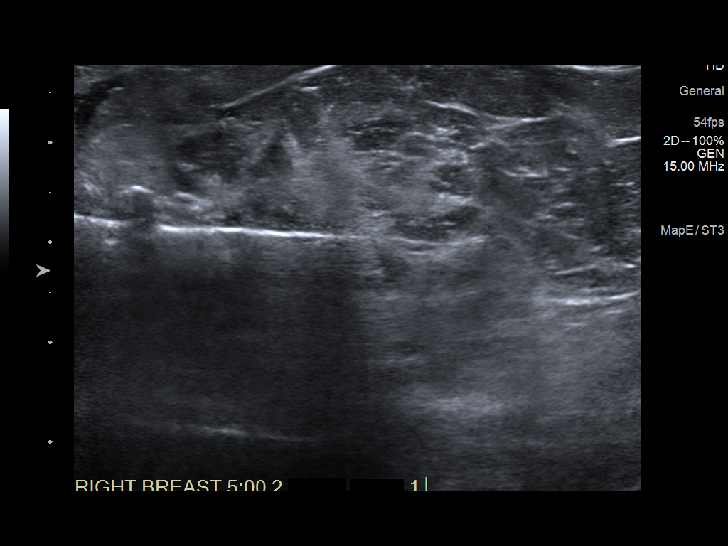
[im 4/12]
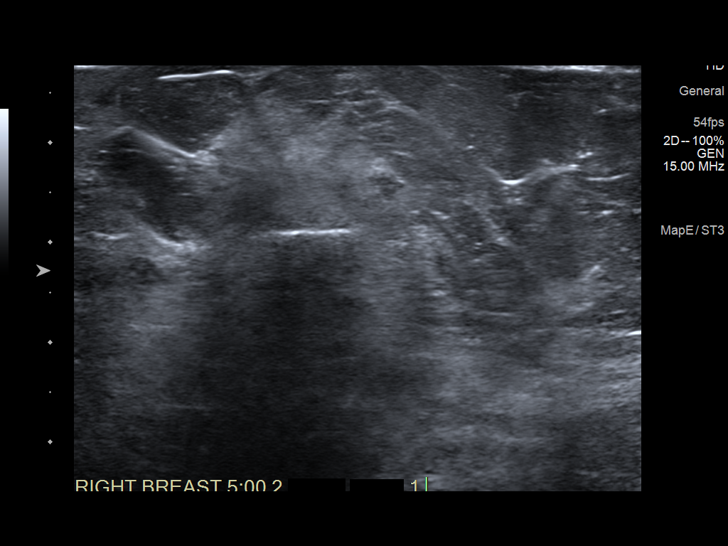
[im 5/12]
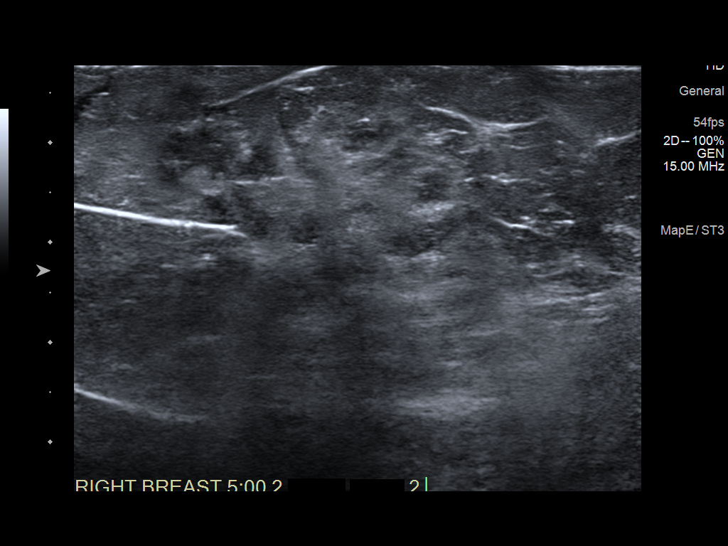
[im 6/12]
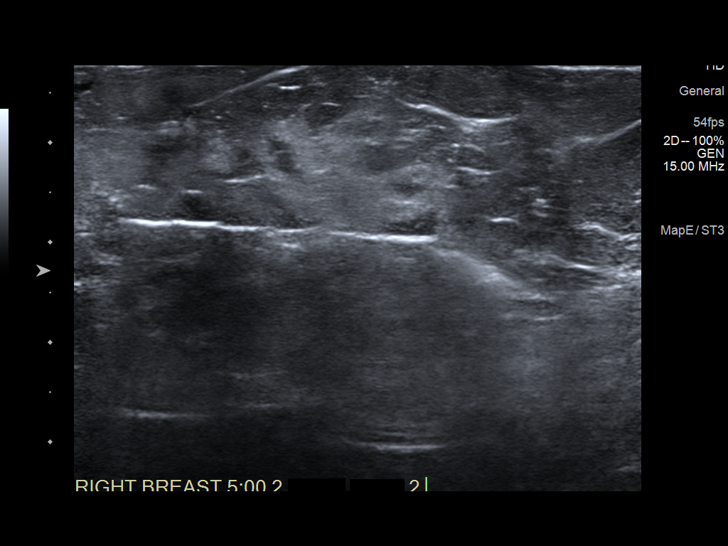
[im 7/12]
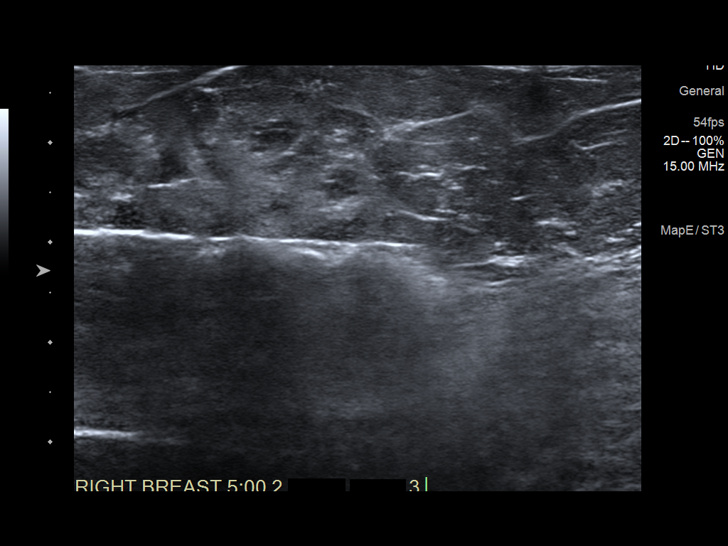
[im 8/12]
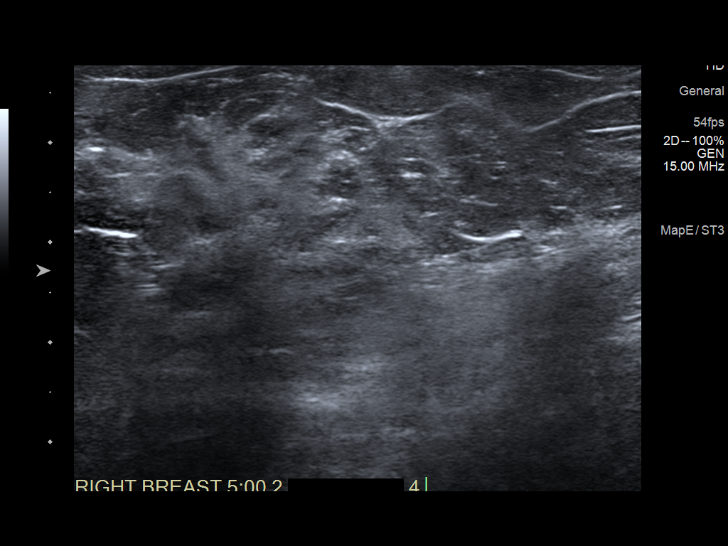
[im 9/12]
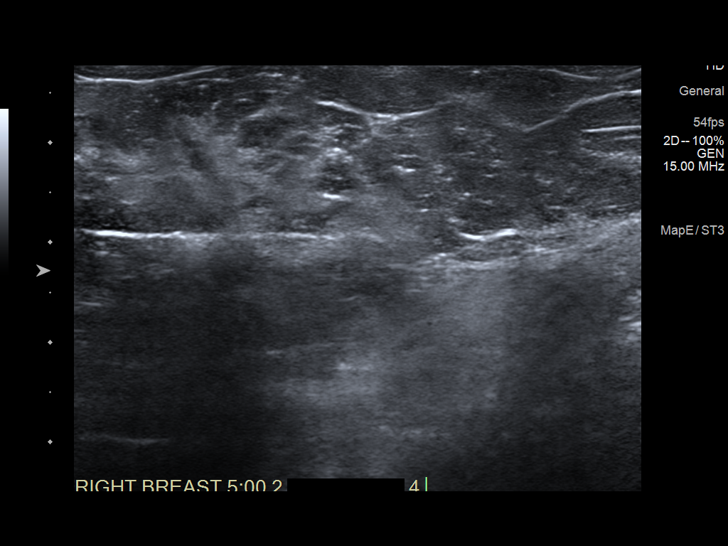
[im 10/12]
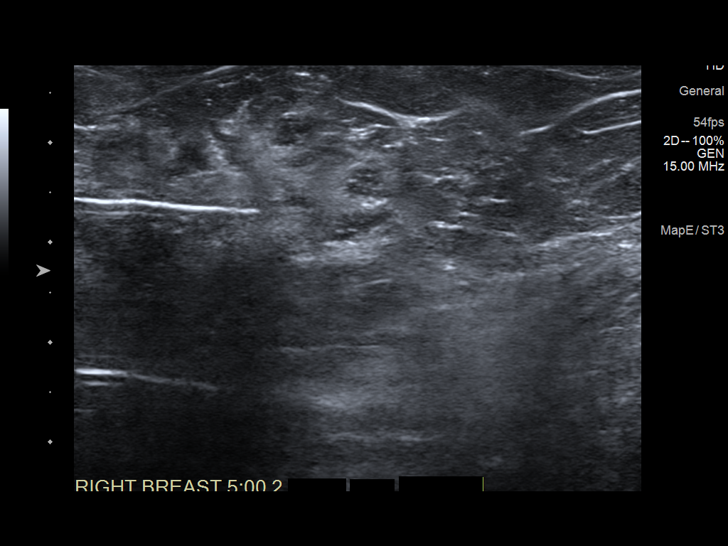
[im 11/12]
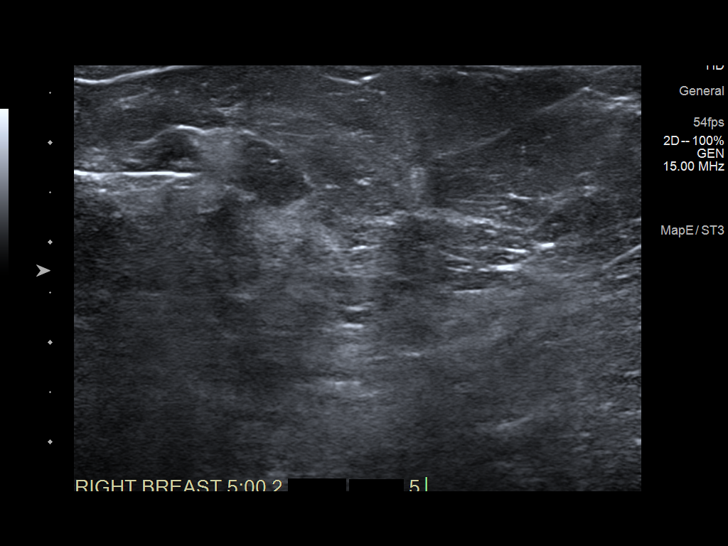
[im 12/12]
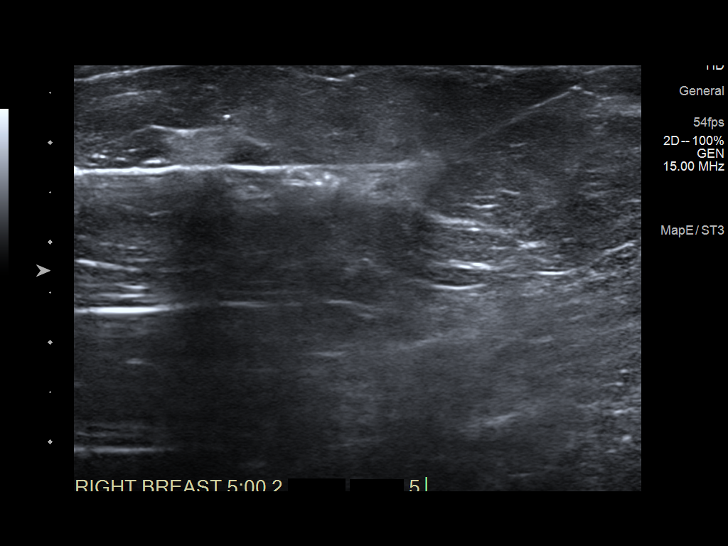

[12 of 12 positions shown; findings below may reference images not displayed]



Lesion quadrant: Lower inner quadrant

Using sterile technique and 1% lidocaine and 1% lidocaine with
epinephrine as local anesthetic, under direct ultrasound
visualization, a 12 gauge Hashimoto device was used to perform
biopsy of a mass in the 5 o'clock region of the right breast using
an inferior to superior approach. At the conclusion of the procedure
a ribbon shaped tissue marker clip was deployed into the biopsy
cavity. Follow up 2 view mammogram was performed and dictated
separately.
IMPRESSION: Ultrasound guided biopsy of the right breast. No apparent
complications.

ADDENDUM:
Pathology revealed GRADE II INVASIVE DUCTAL CARCINOMA of the Right
breast, 5 o'clock. This was found to be concordant by Dr. Thuan
Adam Ahmed.

Pathology results were discussed with the patient's [place], Lotanya by telephone, per patient request. Ms. Lakisha
reported doing well after the biopsy with tenderness at the site.
Post biopsy instructions and care were reviewed and questions were
answered. The patient's daughter was encouraged to call [facility]

The patient was referred to [REDACTED]
[REDACTED] at [REDACTED] on
November 28, 2018.

Pathology results reported by Princess Canady, RN on 11/20/2018.

*** End of Addendum ***

## 2021-01-19 IMAGING — MG MM CLIP PLACEMENT
2 series · 2 of 2 positions shown · non-contrast
Comparison: Previous exam(s).

CLINICAL DATA: Status post ultrasound-guided core biopsy of the
right breast.

EXAM:
DIAGNOSTIC RIGHT MAMMOGRAM POST ULTRASOUND BIOPSY

[R ML]
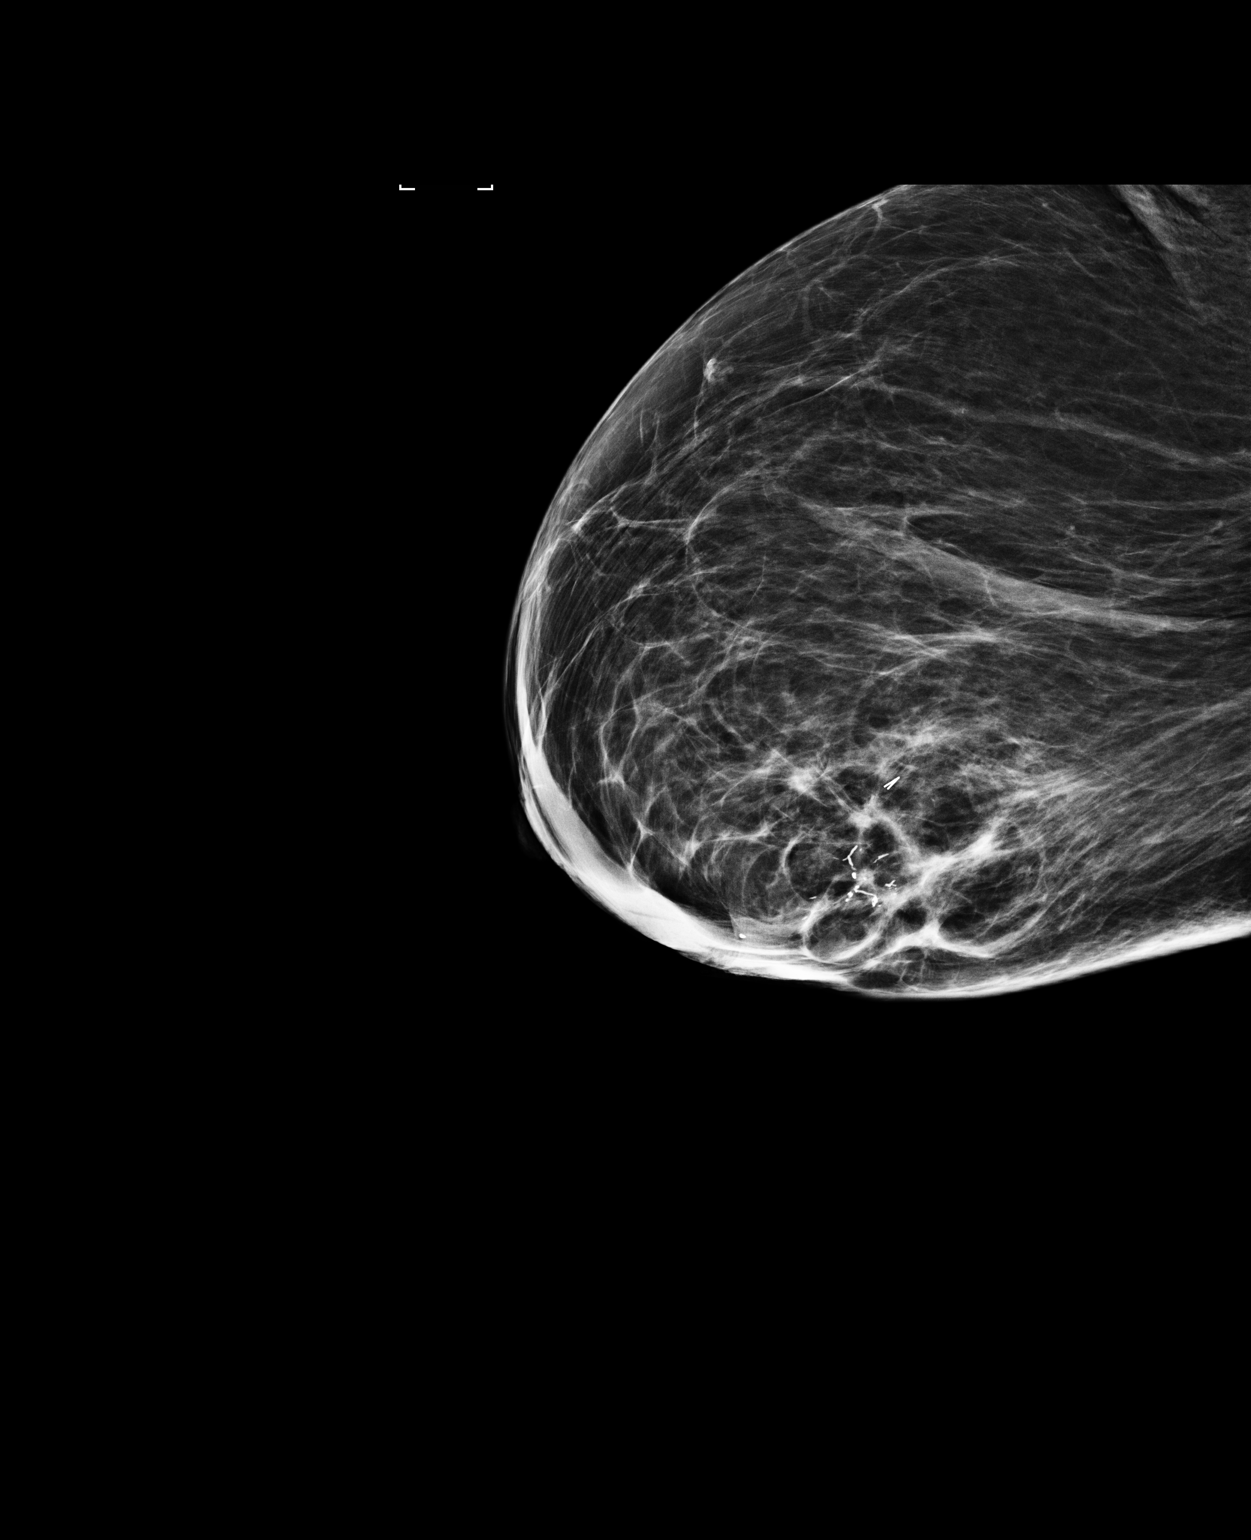

[R CC]
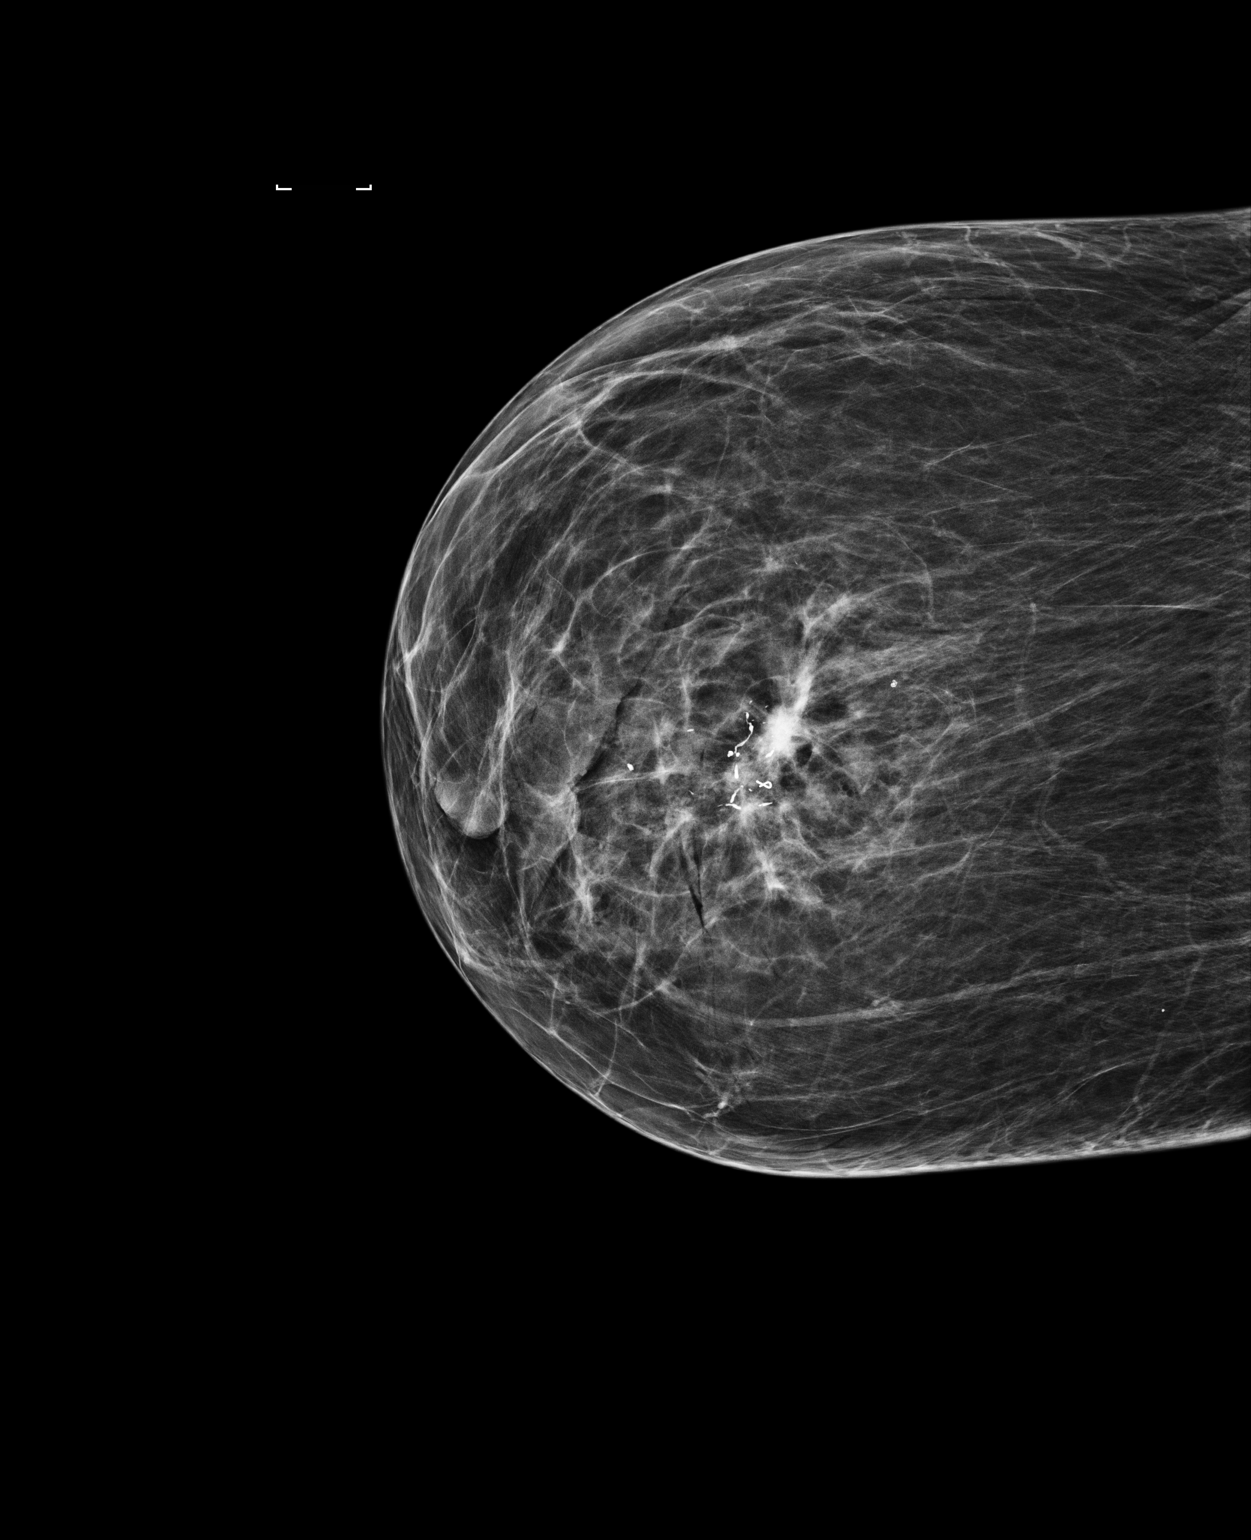

[2 of 2 positions shown; findings below may reference images not displayed]

FINDINGS: Mammographic images were obtained following ultrasound guided biopsy
of the right breast. Mammographic images show there is a ribbon
shaped clip in appropriate position in the 5 o'clock region of the
right breast.
IMPRESSION: Status post ultrasound-guided core biopsy of the right breast with
pathology pending.

Final Assessment: Post Procedure Mammograms for Marker Placement

## 2021-02-04 ENCOUNTER — Ambulatory Visit (INDEPENDENT_AMBULATORY_CARE_PROVIDER_SITE_OTHER): Payer: Medicare Other

## 2021-02-04 DIAGNOSIS — R55 Syncope and collapse: Secondary | ICD-10-CM

## 2021-02-04 LAB — CUP PACEART REMOTE DEVICE CHECK
Battery Remaining Percentage: 85 %
Brady Statistic RA Percent Paced: 4 %
Brady Statistic RV Percent Paced: 88 %
Date Time Interrogation Session: 20220414072938
Implantable Lead Implant Date: 20200624
Implantable Lead Implant Date: 20200624
Implantable Lead Location: 753859
Implantable Lead Location: 753860
Implantable Lead Model: 377
Implantable Lead Model: 377
Implantable Lead Serial Number: 81195510
Implantable Pulse Generator Implant Date: 20200624
Lead Channel Impedance Value: 410 Ohm
Lead Channel Impedance Value: 546 Ohm
Lead Channel Sensing Intrinsic Amplitude: 1 mV
Lead Channel Sensing Intrinsic Amplitude: 16.2 mV
Lead Channel Setting Pacing Amplitude: 2.4 V
Lead Channel Setting Pacing Amplitude: 2.4 V
Lead Channel Setting Pacing Pulse Width: 0.4 ms
Pulse Gen Model: 407145
Pulse Gen Serial Number: 69638782

## 2021-02-22 NOTE — Progress Notes (Signed)
Remote pacemaker transmission.   

## 2021-04-08 ENCOUNTER — Ambulatory Visit: Payer: Medicare Other | Admitting: Internal Medicine

## 2021-04-28 ENCOUNTER — Ambulatory Visit: Payer: Medicare Other | Admitting: Neurology

## 2021-04-28 ENCOUNTER — Encounter: Payer: Self-pay | Admitting: Neurology

## 2021-05-05 LAB — CUP PACEART REMOTE DEVICE CHECK
Battery Remaining Percentage: 85 %
Brady Statistic RA Percent Paced: 3 %
Brady Statistic RV Percent Paced: 88 %
Date Time Interrogation Session: 20220713073738
Implantable Lead Implant Date: 20200624
Implantable Lead Implant Date: 20200624
Implantable Lead Location: 753859
Implantable Lead Location: 753860
Implantable Lead Model: 377
Implantable Lead Model: 377
Implantable Lead Serial Number: 81195510
Implantable Pulse Generator Implant Date: 20200624
Lead Channel Impedance Value: 410 Ohm
Lead Channel Impedance Value: 527 Ohm
Lead Channel Sensing Intrinsic Amplitude: 0.8 mV
Lead Channel Sensing Intrinsic Amplitude: 15.7 mV
Lead Channel Setting Pacing Amplitude: 2.4 V
Lead Channel Setting Pacing Amplitude: 2.4 V
Lead Channel Setting Pacing Pulse Width: 0.4 ms
Pulse Gen Model: 407145
Pulse Gen Serial Number: 69638782

## 2021-05-06 ENCOUNTER — Ambulatory Visit (INDEPENDENT_AMBULATORY_CARE_PROVIDER_SITE_OTHER): Payer: 59

## 2021-05-06 DIAGNOSIS — R55 Syncope and collapse: Secondary | ICD-10-CM

## 2021-05-13 NOTE — Progress Notes (Signed)
Cardiology Office Note   Date:  05/14/2021   ID:  [name], DOB 1933/03/20, MRN [id number]  PCP:  Pcp, No    No chief complaint on file.  Chronic diastolic heart failure  Wt Readings from Last 3 Encounters:  05/14/21 182 lb 9.6 oz (82.8 kg)  11/12/20 183 lb 6.4 oz (83.2 kg)  10/13/20 182 lb 5.1 oz (82.7 kg)       History of Present Illness: [name] is a 87 y.o. female  who has had diastolic dysfunction in the past. She is on Coumadin for DVT. She has had atrial arrhythmias and chronic LE edema.   In addition to the history of h/o DVT (on Coumadin for this), chronic diastolic CHF, she also has deconditioning, obesity, HTN, SVT (correlated with pre-syncope, on amiodarone), DM, chronic anemia/thrombocytopenia, LBBB, probable CKD stage III (Cr 1.3 in 08/2014), mod MR by echo 2014 who presents for f/u. Per notes in 2013 she had an EPS with ablation for SVT which did not eliminate the SVT completely. She also had bradycardia which limited medication. She was placed on amiodarone by Dr. Lovena Le. Last echo 07/2013: mild LVH, moderate focal basal hypertrophy of septum, EF 55-60%, grade 1 DD, mod MR, mod LAE, PASP 40.   She sustained a mechanical fall in 07/2015 and was in the hospital for a week.   She was seen in [facility] 2017 for fluid overload.  Lasix was increased and potassium was increased.    In 2018, there was a concern regarding stroke as she developed some stuttering and swallowing difficulties.  Her workup has been unrevealing at this time.  We checked carotid Doppler and echocardiogram.   Prior records show: "She followed with nephrology and cardiology in [place] and also has established care with [facility] as well. There are records in [place] but these only appear to be patient discharge instructions rather than provider notes.   She was admitted 06/08/2019 with shortness of breath and arm pain, found to have acute on chronic diastolic CHF requiring  diuresis. Her troponin was elevated to a peak of 310, felt due to demand ischemia in absence of true angina. Left arm pain was felt MSK, supported by plain films. No further ischemic workup was recommended. Age adjusted D-dimer was felt wnl. Per Dr. Lovena Le, device appeared to be working normally and did not need it interrogated during admission but routine EP f/u was recommended. 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 Per Dr. Francesca Oman note, "In the absence of fever or sign of sepsis I would not pursue further work-up at this point given the patient age and comorbidities.  I would consider TEE once any signs of infection." She recommended to restart oral Bumex after a 1 day holiday from diuresis. F/u Cr was still elevated on f/u labs 8/31 so lisinopril was discontinued. F/u BMET 06/28/19 showed K 4.5, Cr 1.87 (previous range variable from 1.4-2.3). Otherwise recent labs 05/2019 showed Hgb 8.9 (variable in 9-10 range this year), Plt 105 (variable in 2020), WBC 2.4, A1C 7.9, 07/2018 LDL 96.    She had a pacer placed in [place], and has f/u in our PM clinic scheduled. There was a concern for a lead infection.  She was seen by Dr. Lovena Le.  Pacer was functioning normally.  Due to lack of symptoms of infection, no TEE  was done.   She was diagnosed with breast cancer and the plan was for her to have surgery in [place].  Ultimately, she did not have surgery and is being treated with anastrozole.  There was a concern about putting her to sleep.   She has ben bothered by arthritis in her right shoulder.    Records from 12/21 hospital admission show: " Covid 19 infection on 06/12/2020 comes into the hospital for shortness of breath that started 3 days prior to admission, in the ED he was found to have a temperature of 100.5 breathing 20 times per minute  with a white count of 4 hemoglobin of 10 lactic acid of 1.4 chest x-ray showed new infiltrates in both lung fields influenza and SARS-CoV-2 PCR were negative, he was started on IV Lasix Rocephin and doxy.  Empiric antibiotics were discontinued she was started on aggressive IV diuresis she is diuresed about 3 L and her breathing is improving along with her weight which is coming down."   1/22 pacer check showed: "Alert for AF burden 100% on 11/12/20, Elevated to > 255 since 09/30/20. Patient just d/ced from hospital for CHF exacerbation 10/13/20. Spoke with [name](daughter) per DPR, and she reports patient has been eating high sodium foods and drinking increased amount o liquids sine returning to long term care facility. She reports increased BLE edema. Daughter concerned that nursing facility does not have patient on fluid restriction or low NA diet."     She had a visit in Jan 2022 from a nursing home:  "Wants to have her orders allow for 1 extra torsemide pill, 20 mg as needed for swelling"  She can now get the extra diuretic and this helps.  DM has been difficult to control.     Past Medical History:  Diagnosis Date   Abnormal echocardiogram    a. possible mass on echo 05/2019 on PPM, b. no evidence of mass / atrial lead vegetation on follow up echo 06/2019   Anemia    Arthritis    Breast cancer ([place]) 10/2018   Invasive ductal carcinoma   Cancer (HCC)    Cataract    Chronic combined systolic and diastolic CHF (congestive heart failure) ([place])    a. Previously diastolic but patient AVS from outside hospital listed systolic CHF and cardiomyopathy, records pending.   CKD (chronic kidney disease), stage III (HCC)    Clotting disorder (HCC)    CVA (cerebral vascular accident) ([place])    residual right sided weakness and mild dysphagia   Diabetes mellitus ([place])    Dizziness and giddiness    DVT (deep venous thrombosis) ([place])    a. on anticoagulation for this.   Essential hypertension    LBBB  (left bundle branch block)    Mitral regurgitation    a. Mod MR by echo 2014.   Muscular deconditioning    Obesity    Pacemaker    Pericardial effusion    SVT (supraventricular tachycardia) ([place])    a. In 2013 she had an EPS with ablation for SVT which did not eliminate the SVT completely. She also had bradycardia which limited medication. She was placed on amiodarone by Dr. Lovena Le.   Thrombocytopenia ([place]) 11/21/2011    Past Surgical History:  Procedure Laterality Date   EYE SURGERY     SUPRAVENTRICULAR TACHYCARDIA ABLATION N/A 10/11/2012   Procedure: SUPRAVENTRICULAR TACHYCARDIA ABLATION;  Surgeon: Evans Lance, MD;  Location: [facility] CATH LAB;  Service: Cardiovascular;  Laterality: N/A;  Current Outpatient Medications  Medication Sig Dispense Refill   amiodarone (PACERONE) 200 MG tablet Take one tablet by mouth daily Monday-Saturday. Do NOT take Sunday 90 tablet 3   anastrozole (ARIMIDEX) 1 MG tablet TAKE 1 TABLET(1 MG) BY MOUTH DAILY 90 tablet 0   atorvastatin (LIPITOR) 20 MG tablet TAKE 1 TABLET(20 MG) BY MOUTH AT BEDTIME 90 tablet 2   diclofenac sodium (VOLTAREN) 1 % GEL Apply 2 g topically 4 (four) times daily. 100 g 1   Docusate Sodium 100 MG capsule Take 100 mg by mouth daily.     Dulaglutide (TRULICITY) 3.00 TM/2.2QJ SOPN See admin instructions.     ELIQUIS 2.5 MG TABS tablet TAKE 1 TABLET(2.5 MG) BY MOUTH TWICE DAILY 60 tablet 2   ferrous sulfate 325 (65 FE) MG tablet Take 325 mg by mouth daily with breakfast.     HUMALOG KWIKPEN 100 UNIT/ML KwikPen Inject 0-12 Units into the skin See admin instructions. Inject 0-12 units subcutaneously 3 times daily per sliding scale: BG 0-150 = 0 units BG 151-200 = 2 units BG 201-250 = 4 units BG 251-300 = 6 units BG 301-350 = 8 units BG 351-400 = 10 units BG 401+ = 12 units     insulin glargine (LANTUS) 100 unit/mL SOPN Inject 15 Units into the skin at bedtime.     lisinopril (ZESTRIL) 5 MG tablet Take 1 tablet (5 mg total) by mouth  daily. 30 tablet 0   metoprolol succinate (TOPROL-XL) 25 MG 24 hr tablet Take 1 tablet (25 mg total) by mouth daily. 30 tablet 0   mirtazapine (REMERON) 7.5 MG tablet TAKE 1 TABLET(7.5 MG) BY MOUTH AT BEDTIME 90 tablet 1   omeprazole (PRILOSEC) 20 MG capsule Take 20 mg by mouth See admin instructions. Take 1 tablet by mouth at 6AM, 8AM, and 4PM.     oxybutynin (DITROPAN) 5 MG tablet TAKE 1 TABLET(5 MG) BY MOUTH TWICE DAILY 180 tablet 1   potassium chloride (KLOR-CON) 10 MEQ tablet Take 4 tablets by mouth daily.     pregabalin (LYRICA) 75 MG capsule Take 1 capsule (75 mg total) by mouth at bedtime. 90 capsule 1   torsemide (DEMADEX) 20 MG tablet Take 1 tablet (20 mg total) by mouth 2 (two) times daily. You may take an extra 20 mg tablet daily as needed for swelling 30 tablet 0   No current facility-administered medications for this visit.    Allergies:   Aspirin and Penicillins    Social History:  The patient  reports that she has never smoked. She has never used smokeless tobacco. She reports that she does not drink alcohol and does not use drugs.   Family History:  The patient's family history includes Breast cancer in her maternal aunt; Cancer in her father; Heart attack in her brother; Hypertension in her brother, daughter, and sister; Stroke in her mother and sister.    ROS:  Please see the history of present illness.   Otherwise, review of systems are positive for LE edema.   All other systems are reviewed and negative.    PHYSICAL EXAM: VS:  BP 136/72   Pulse 60   Ht 5\' 5"  (1.651 m)   Wt 182 lb 9.6 oz (82.8 kg)   SpO2 96%   BMI 30.39 kg/m  , BMI Body mass index is 30.39 kg/m. GEN: Well nourished, well developed, in no acute distress; mildly slurred speech, in wheelchair HEENT: normal Neck: no JVD, carotid bruits, or masses Cardiac: RRR; no murmurs,  rubs, or gallops,1+ bilateral LE edema  Respiratory:  clear to auscultation bilaterally, normal work of breathing GI: soft,  nontender, nondistended, + BS MS: no deformity or atrophy Skin: warm and dry, no rash Neuro:  Strength and sensation are intact Psych: euthymic mood, full affect   EKG:   The ekg ordered today demonstrates V paced rhythm   Recent Labs: 07/14/2020: TSH 1.099 07/20/2020: Magnesium 1.8 10/08/2020: ALT 56; B Natriuretic Peptide 2,506.1; Hemoglobin 10.7; Platelets 113 10/13/2020: BUN 21; Creatinine, Ser 1.43; Potassium 3.8; Sodium 145   Lipid Panel    Component Value Date/Time   CHOL 195 08/15/2018 1236   TRIG 56 01/02/2019 1425   HDL 81 08/15/2018 1236   CHOLHDL 2.4 08/15/2018 1236   CHOLHDL 2.6 12/11/2008 0130   VLDL 13 12/11/2008 0130   LDLCALC 96 08/15/2018 1236     Other studies Reviewed: Additional studies/ records that were reviewed today with results demonstrating: labs reviewed.   ASSESSMENT AND PLAN:  Chronic diastolic heart failure: Trying to get lower salt options.  Continue regular torsemide and additional dose as needed for worsening swelling.  Renal function being followed by Dr. Moshe Cipro.  Stage 3 CKD.  Hypertensive heart disease: The current medical regimen is effective;  continue present plan and medications. Anticoagulated: Low dose Eliquis Lower extremity edema: Elevate legs is much as possible.  Compression stockings will be helpful as well. Atrial tach/SVT: Continue Amiodarone. She will need TSH checked at her next blood draw.  She is also seeing Dr. Lovena Le for pacemaker follow-up. DM2: A1c 7.5.  Managed with endocrinology.   Current medicines are reviewed at length with the patient today.  The patient concerns regarding her medicines were addressed.  The following changes have been made:  No change  Labs/ tests ordered today include:  No orders of the defined types were placed in this encounter.   Recommend 150 minutes/week of aerobic exercise Low fat, low carb, high fiber diet recommended  Disposition:   FU in 6 months   Signed, Larae Grooms, MD  05/14/2021 3:20 PM    [facility] [address], [place], [place]  [address] Phone: [phone]; Fax: [phone]

## 2021-05-14 ENCOUNTER — Other Ambulatory Visit: Payer: Self-pay

## 2021-05-14 ENCOUNTER — Encounter: Payer: Self-pay | Admitting: Internal Medicine

## 2021-05-14 ENCOUNTER — Ambulatory Visit (INDEPENDENT_AMBULATORY_CARE_PROVIDER_SITE_OTHER): Payer: Medicare Other | Admitting: Internal Medicine

## 2021-05-14 ENCOUNTER — Encounter: Payer: Self-pay | Admitting: Interventional Cardiology

## 2021-05-14 ENCOUNTER — Ambulatory Visit (INDEPENDENT_AMBULATORY_CARE_PROVIDER_SITE_OTHER): Payer: Medicare Other | Admitting: Interventional Cardiology

## 2021-05-14 VITALS — BP 136/72 | HR 60 | Ht 65.0 in | Wt 182.6 lb

## 2021-05-14 DIAGNOSIS — I471 Supraventricular tachycardia: Secondary | ICD-10-CM

## 2021-05-14 DIAGNOSIS — Z950 Presence of cardiac pacemaker: Secondary | ICD-10-CM | POA: Diagnosis not present

## 2021-05-14 DIAGNOSIS — E1122 Type 2 diabetes mellitus with diabetic chronic kidney disease: Secondary | ICD-10-CM

## 2021-05-14 DIAGNOSIS — N183 Chronic kidney disease, stage 3 unspecified: Secondary | ICD-10-CM

## 2021-05-14 DIAGNOSIS — I442 Atrioventricular block, complete: Secondary | ICD-10-CM | POA: Diagnosis not present

## 2021-05-14 DIAGNOSIS — I10 Essential (primary) hypertension: Secondary | ICD-10-CM

## 2021-05-14 DIAGNOSIS — Z7901 Long term (current) use of anticoagulants: Secondary | ICD-10-CM

## 2021-05-14 DIAGNOSIS — I5032 Chronic diastolic (congestive) heart failure: Secondary | ICD-10-CM | POA: Diagnosis not present

## 2021-05-14 DIAGNOSIS — Z794 Long term (current) use of insulin: Secondary | ICD-10-CM

## 2021-05-14 DIAGNOSIS — I110 Hypertensive heart disease with heart failure: Secondary | ICD-10-CM

## 2021-05-14 NOTE — Patient Instructions (Signed)
Medication Instructions:  Your physician recommends that you continue on your current medications as directed. Please refer to the Current Medication list given to you today.  Labwork: None ordered.  Testing/Procedures: None ordered.  Follow-Up: Your physician wants you to follow-up in: one year with Cristopher Peru, MD or one of the following Advanced Practice Providers on your designated Care Team:   Tommye Standard, [place] Legrand Como "Jonni Sanger" Chalmers Cater, [place]  Remote monitoring is used to monitor your Pacemaker from home. This monitoring reduces the number of office visits required to check your device to one time per year. It allows [place] to keep an eye on the functioning of your device to ensure it is working properly. You are scheduled for a device check from home on 08/05/2021. You may send your transmission at any time that day. If you have a wireless device, the transmission will be sent automatically. After your physician reviews your transmission, you will receive a postcard with your next transmission date.  Any Other Special Instructions Will Be Listed Below (If Applicable).  If you need a refill on your cardiac medications before your next appointment, please call your pharmacy.

## 2021-05-14 NOTE — Patient Instructions (Addendum)
Medication Instructions:  Your physician recommends that you continue on your current medications as directed. Please refer to the Current Medication list given to you today.  *If you need a refill on your cardiac medications before your next appointment, please call your pharmacy*   Lab Work: none If you have labs (blood work) drawn today and your tests are completely normal, you will receive your results only by: [place] (if you have MyChart) OR A paper copy in the mail If you have any lab test that is abnormal or we need to change your treatment, we will call you to review the results.   Testing/Procedures: none   Follow-Up: At [facility], you and your health needs are our priority.  As part of our continuing mission to provide you with exceptional heart care, we have created designated Provider Care Teams.  These Care Teams include your primary Cardiologist (physician) and Advanced Practice Providers (APPs -  Physician Assistants and Nurse Practitioners) who all work together to provide you with the care you need, when you need it.  We recommend signing up for the patient portal called "MyChart".  Sign up information is provided on this After Visit Summary.  MyChart is used to connect with patients for Virtual Visits (Telemedicine).  Patients are able to view lab/test results, encounter notes, upcoming appointments, etc.  Non-urgent messages can be sent to your provider as well.   To learn more about what you can do with MyChart, go to [email or web address].    Your next appointment:   January 23,2023 at 11:00  The format for your next appointment:   In Person  Provider:   Casandra Doffing, MD   Other Instructions

## 2021-05-14 NOTE — Progress Notes (Signed)
HPI Ms. Juste returns today for followup. She is a pleasant 87 yo woman with a h/o SVT s/p remote ablation of a concealed mid-septal AP. She subsequently developed heart block and underwent PPM insertion. She has done well in the interim except for an overall reduction in physical activity. She is using a wheel chair.She has been on amiodarone but is no longer maintaining NSR. She was unaware that she was out of rhythm. No chest pain or sob. No syncope.  Allergies  Allergen Reactions   Aspirin Itching   Penicillins Itching and Rash    DID THE REACTION INVOLVE: Swelling of the face/tongue/throat, SOB, or low BP? Yes Sudden or severe rash/hives, skin peeling, or the inside of the mouth or nose? No Did it require medical treatment? Yes When did it last happen? "More than 10 years ago" If all above answers are "NO", may proceed with cephalosporin use.      Current Outpatient Medications  Medication Sig Dispense Refill   amiodarone (PACERONE) 200 MG tablet Take one tablet by mouth daily Monday-Saturday. Do NOT take Sunday 90 tablet 3   anastrozole (ARIMIDEX) 1 MG tablet TAKE 1 TABLET(1 MG) BY MOUTH DAILY 90 tablet 0   atorvastatin (LIPITOR) 20 MG tablet TAKE 1 TABLET(20 MG) BY MOUTH AT BEDTIME 90 tablet 2   diclofenac sodium (VOLTAREN) 1 % GEL Apply 2 g topically 4 (four) times daily. 100 g 1   Docusate Sodium 100 MG capsule Take 100 mg by mouth daily.     Dulaglutide (TRULICITY) 9.56 LO/7.5IE SOPN See admin instructions.     ELIQUIS 2.5 MG TABS tablet TAKE 1 TABLET(2.5 MG) BY MOUTH TWICE DAILY 60 tablet 2   ferrous sulfate 325 (65 FE) MG tablet Take 325 mg by mouth daily with breakfast.     HUMALOG KWIKPEN 100 UNIT/ML KwikPen Inject 0-12 Units into the skin See admin instructions. Inject 0-12 units subcutaneously 3 times daily per sliding scale: BG 0-150 = 0 units BG 151-200 = 2 units BG 201-250 = 4 units BG 251-300 = 6 units BG 301-350 = 8 units BG 351-400 = 10 units BG 401+ = 12  units     insulin glargine (LANTUS) 100 unit/mL SOPN Inject 15 Units into the skin at bedtime.     lisinopril (ZESTRIL) 5 MG tablet Take 1 tablet (5 mg total) by mouth daily. 30 tablet 0   metoprolol succinate (TOPROL-XL) 25 MG 24 hr tablet Take 1 tablet (25 mg total) by mouth daily. 30 tablet 0   mirtazapine (REMERON) 7.5 MG tablet TAKE 1 TABLET(7.5 MG) BY MOUTH AT BEDTIME 90 tablet 1   omeprazole (PRILOSEC) 20 MG capsule Take 20 mg by mouth See admin instructions. Take 1 tablet by mouth at 6AM, 8AM, and 4PM.     oxybutynin (DITROPAN) 5 MG tablet TAKE 1 TABLET(5 MG) BY MOUTH TWICE DAILY 180 tablet 1   potassium chloride (KLOR-CON) 10 MEQ tablet Take 4 tablets by mouth daily.     pregabalin (LYRICA) 75 MG capsule Take 1 capsule (75 mg total) by mouth at bedtime. 90 capsule 1   torsemide (DEMADEX) 20 MG tablet Take 1 tablet (20 mg total) by mouth 2 (two) times daily. You may take an extra 20 mg tablet daily as needed for swelling 30 tablet 0   No current facility-administered medications for this visit.     Past Medical History:  Diagnosis Date   Abnormal echocardiogram    a. possible mass on echo 05/2019  on PPM, b. no evidence of mass / atrial lead vegetation on follow up echo 06/2019   Anemia    Arthritis    Breast cancer ([place]) 10/2018   Invasive ductal carcinoma   Cancer (HCC)    Cataract    Chronic combined systolic and diastolic CHF (congestive heart failure) ([place])    a. Previously diastolic but patient AVS from outside hospital listed systolic CHF and cardiomyopathy, records pending.   CKD (chronic kidney disease), stage III (HCC)    Clotting disorder (HCC)    CVA (cerebral vascular accident) ([place])    residual right sided weakness and mild dysphagia   Diabetes mellitus ([place])    Dizziness and giddiness    DVT (deep venous thrombosis) ([place])    a. on anticoagulation for this.   Essential hypertension    LBBB (left bundle branch block)    Mitral regurgitation    a. Mod MR by echo  2014.   Muscular deconditioning    Obesity    Pacemaker    Pericardial effusion    SVT (supraventricular tachycardia) ([place])    a. In 2013 she had an EPS with ablation for SVT which did not eliminate the SVT completely. She also had bradycardia which limited medication. She was placed on amiodarone by Dr. Lovena Le.   Thrombocytopenia ([place]) 11/21/2011    ROS:   All systems reviewed and negative except as noted in the HPI.   Past Surgical History:  Procedure Laterality Date   EYE SURGERY     SUPRAVENTRICULAR TACHYCARDIA ABLATION N/A 10/11/2012   Procedure: SUPRAVENTRICULAR TACHYCARDIA ABLATION;  Surgeon: Evans Lance, MD;  Location: [facility] CATH LAB;  Service: Cardiovascular;  Laterality: N/A;     Family History  Problem Relation Age of Onset   Stroke Mother    Cancer Father        prostate   Hypertension Daughter    Heart attack Brother        x2   Hypertension Brother    Stroke Sister    Hypertension Sister    Breast cancer Maternal Aunt      Social History   Socioeconomic History   Marital status: Widowed    Spouse name: Not on file   Number of children: Not on file   Years of education: Not on file   Highest education level: Not on file  Occupational History   Occupation: retired  Tobacco Use   Smoking status: Never   Smokeless tobacco: Never  Vaping Use   Vaping Use: Never used  Substance and Sexual Activity   Alcohol use: No   Drug use: No   Sexual activity: Not Currently  Other Topics Concern   Not on file  Social History Narrative   Lives at home with grandchild, daughter comes and help    Social Determinants of Health   Financial Resource Strain: Medium Risk   Difficulty of Paying Living Expenses: Somewhat hard  Food Insecurity: Not on file  Transportation Needs: Not on file  Physical Activity: Not on file  Stress: Not on file  Social Connections: Not on file  Intimate Partner Violence: Not on file     BP 136/72   Pulse 60   Ht 5\' 5"  (1.651  m)   Wt 182 lb 9.6 oz (82.8 kg)   SpO2 96%   BMI 30.39 kg/m   Physical Exam:  Well appearing NAD HEENT: Unremarkable Neck:  No JVD, no thyromegally Lymphatics:  No adenopathy Back:  No CVA tenderness Lungs:  Clear with no wheees HEART:  Regular rate rhythm, no murmurs, no rubs, no clicks Abd:  soft, positive bowel sounds, no organomegally, no rebound, no guarding Ext:  2 plus pulses, no edema, no cyanosis, no clubbing Skin:  No rashes no nodules Neuro:  CN II through XII intact, motor grossly intact  EKG - atrial fib/flutter with ventricular pacing  DEVICE  Normal device function.  See PaceArt for details.   Assess/Plan:  1. CHB - she is asymptomatic, s/p PPM insertion. 2. PPM - her Biotronik DDD PM is working normally. She has about 8 years of battery longevity. We adjusted her sensitivity in the atrium to improve sensing.  3. HTN - her SBP is well controlled. No change in her meds. 4. Arthritis - we discussed knee replacement. While she could undergo knee replacement, I do not think that she would be a good candidate for the rehab involved.     Salome Spotted.

## 2021-05-29 NOTE — Progress Notes (Signed)
Remote pacemaker transmission.   

## 2021-08-04 LAB — CUP PACEART REMOTE DEVICE CHECK
Battery Remaining Percentage: 80 %
Brady Statistic RA Percent Paced: 0 %
Brady Statistic RV Percent Paced: 79 %
Date Time Interrogation Session: 20221012083421
Implantable Lead Implant Date: 20200624
Implantable Lead Implant Date: 20200624
Implantable Lead Location: 753859
Implantable Lead Location: 753860
Implantable Lead Model: 377
Implantable Lead Model: 377
Implantable Lead Serial Number: 81195510
Implantable Pulse Generator Implant Date: 20200624
Lead Channel Impedance Value: 390 Ohm
Lead Channel Impedance Value: 527 Ohm
Lead Channel Sensing Intrinsic Amplitude: 0.7 mV
Lead Channel Sensing Intrinsic Amplitude: 15.7 mV
Lead Channel Setting Pacing Amplitude: 1.8 V
Lead Channel Setting Pacing Amplitude: 3 V
Lead Channel Setting Pacing Pulse Width: 0.4 ms
Pulse Gen Model: 407145
Pulse Gen Serial Number: 69638782

## 2021-08-05 ENCOUNTER — Ambulatory Visit (INDEPENDENT_AMBULATORY_CARE_PROVIDER_SITE_OTHER): Payer: Medicare Other

## 2021-08-05 DIAGNOSIS — I442 Atrioventricular block, complete: Secondary | ICD-10-CM

## 2021-08-13 NOTE — Progress Notes (Signed)
Remote pacemaker transmission.   

## 2021-09-06 ENCOUNTER — Other Ambulatory Visit: Payer: Self-pay | Admitting: *Deleted

## 2021-09-06 DIAGNOSIS — C50311 Malignant neoplasm of lower-inner quadrant of right female breast: Secondary | ICD-10-CM

## 2021-09-06 NOTE — Progress Notes (Signed)
Patient Care Team: Pcp, No as PCP - General Jettie Booze, MD as PCP - Cardiology (Cardiology) Excell Seltzer, MD (Inactive) as Consulting Physician (General Surgery) Nicholas Lose, MD as Consulting Physician (Hematology and Oncology) Kyung Rudd, MD as Consulting Physician (Radiation Oncology)  DIAGNOSIS:    ICD-10-CM   1. Malignant neoplasm of lower-inner quadrant of right breast of female, estrogen receptor positive ([place])  C50.311 MM DIAG BREAST TOMO BILATERAL   Z17.0       SUMMARY OF ONCOLOGIC HISTORY: Oncology History  Malignant neoplasm of lower-inner quadrant of right breast of female, estrogen receptor positive ([place])  11/19/2018 Initial Diagnosis   Palpable right breast mass with calcifications, mammogram revealed focal asymmetry lower right breast 5:00 measuring 4.6 cm, ultrasound revealed overall size of 5.7 cm ultrasound-guided biopsy revealed grade 2 IDC ER 90% PR 80% Ki-67 5%, HER-2 negative, T3N0 stage IIa clinical stage   11/28/2018 Cancer Staging   Staging form: Breast, AJCC 8th Edition - Clinical: Stage IIA (cT3, cN0, cM0, G2, ER+, PR+, HER2-) - Signed by Nicholas Lose, MD on 11/28/2018      CHIEF COMPLIANT: Follow-up of right breast cancer on neoadjuvant anastrozole  INTERVAL HISTORY: [name] is a 87 y.o. with above-mentioned history of right breast cancer currently on neoadjuvant treatment with anastrozole. Mammogram on 05/01/2020 showed stable appearance in the right breast and no evidence of malignancy in the left breast. She presents to the clinic today for follow-up.  She has had profound alopecia as a result of anastrozole therapy.  She denies any other adverse effects to anastrozole.  She uses a wheelchair for ambulation.  She was in the hospital with COVID-19 and finally recovered from that and is able to swallow and eat well.  She has had some speech issues for which she took speech therapy as well.  ALLERGIES:  is allergic to aspirin and  penicillins.  MEDICATIONS:  Current Outpatient Medications  Medication Sig Dispense Refill   amiodarone (PACERONE) 200 MG tablet Take one tablet by mouth daily Monday-Saturday. Do NOT take Sunday 90 tablet 3   anastrozole (ARIMIDEX) 1 MG tablet TAKE 1 TABLET(1 MG) BY MOUTH DAILY 90 tablet 3   atorvastatin (LIPITOR) 20 MG tablet TAKE 1 TABLET(20 MG) BY MOUTH AT BEDTIME 90 tablet 2   diclofenac sodium (VOLTAREN) 1 % GEL Apply 2 g topically 4 (four) times daily. 100 g 1   Docusate Sodium 100 MG capsule Take 100 mg by mouth daily.     Dulaglutide (TRULICITY) 9.48 AX/6.5VV SOPN See admin instructions.     ELIQUIS 2.5 MG TABS tablet TAKE 1 TABLET(2.5 MG) BY MOUTH TWICE DAILY 60 tablet 2   ferrous sulfate 325 (65 FE) MG tablet Take 325 mg by mouth daily with breakfast.     HUMALOG KWIKPEN 100 UNIT/ML KwikPen Inject 0-12 Units into the skin See admin instructions. Inject 0-12 units subcutaneously 3 times daily per sliding scale: BG 0-150 = 0 units BG 151-200 = 2 units BG 201-250 = 4 units BG 251-300 = 6 units BG 301-350 = 8 units BG 351-400 = 10 units BG 401+ = 12 units     insulin glargine (LANTUS) 100 unit/mL SOPN Inject 15 Units into the skin at bedtime.     lisinopril (ZESTRIL) 5 MG tablet Take 1 tablet (5 mg total) by mouth daily. 30 tablet 0   metoprolol succinate (TOPROL-XL) 25 MG 24 hr tablet Take 1 tablet (25 mg total) by mouth daily. 30 tablet 0   mirtazapine (  REMERON) 7.5 MG tablet TAKE 1 TABLET(7.5 MG) BY MOUTH AT BEDTIME 90 tablet 1   omeprazole (PRILOSEC) 20 MG capsule Take 20 mg by mouth See admin instructions. Take 1 tablet by mouth at 6AM, 8AM, and 4PM.     oxybutynin (DITROPAN) 5 MG tablet TAKE 1 TABLET(5 MG) BY MOUTH TWICE DAILY 180 tablet 1   potassium chloride (KLOR-CON) 10 MEQ tablet Take 4 tablets by mouth daily.     pregabalin (LYRICA) 75 MG capsule Take 1 capsule (75 mg total) by mouth at bedtime. 90 capsule 1   torsemide (DEMADEX) 20 MG tablet Take 1 tablet (20 mg total) by  mouth 2 (two) times daily. You may take an extra 20 mg tablet daily as needed for swelling 30 tablet 0   No current facility-administered medications for this visit.    PHYSICAL EXAMINATION: ECOG PERFORMANCE STATUS: 1 - Symptomatic but completely ambulatory  Vitals:   09/07/21 0910  BP: (!) 144/63  Pulse: (!) 59  Resp: 18  Temp: 97.7 F (36.5 C)  SpO2: 98%   Filed Weights   09/07/21 0910  Weight: 186 lb 6.4 oz (84.6 kg)    BREAST: No palpable masses or nodules in either right or left breasts. No palpable axillary supraclavicular or infraclavicular adenopathy no breast tenderness or nipple discharge. (exam performed in the presence of a chaperone)  LABORATORY DATA:  I have reviewed the data as listed CMP Latest Ref Rng & Units 10/13/2020 10/12/2020 10/11/2020  Glucose 70 - 99 mg/dL 96 120(H) 150(H)  BUN 8 - 23 mg/dL _0 Creatinine 0.44 - 1.00 mg/dL 1.43(H) 1.27(H) 1.32(H)  Sodium 135 - 145 mmol/L 145 145 146(H)  Potassium 3.5 - 5.1 mmol/L 3.8 3.3(L) 4.1  Chloride 98 - 111 mmol/L 104 104 107  CO2 22 - 32 mmol/L _1 Calcium 8.9 - 10.3 mg/dL 9.3 8.6(L) 8.4(L)  Total Protein 6.5 - 8.1 g/dL - - -  Total Bilirubin 0.3 - 1.2 mg/dL - - -  Alkaline Phos 38 - 126 U/L - - -  AST 15 - 41 U/L - - -  ALT 0 - 44 U/L - - -    Lab Results  Component Value Date   WBC 4.0 10/08/2020   HGB 10.7 (L) 10/08/2020   HCT 35.7 (L) 10/08/2020   MCV 98.6 10/08/2020   PLT 113 (L) 10/08/2020   NEUTROABS 2.3 10/08/2020    ASSESSMENT & PLAN:  Malignant neoplasm of lower-inner quadrant of right breast of female, estrogen receptor positive ([place]) 11/19/2018:Palpable right breast mass with calcifications, mammogram revealed focal asymmetry lower right breast 5:00 measuring 4.6 cm, ultrasound revealed overall size of 5.7 cm ultrasound-guided biopsy revealed grade 2 IDC ER 90% PR 80% Ki-67 5%, HER-2 negative, T3N0 stage IIa clinical stage   Treatment plan: 1.  Neoadjuvant antiestrogen  therapy with anastrozole started 11/28/2018 2. not planning on surgery because of her health and co morbidities   Mammogram and ultrasound 05/01/2020: Stable appearance of right breast focal asymmetry 4 x 2.6 x 2.2 cm, ultrasound revealed hypoechoic mass at 5 o'clock position 2 cm from nipple measuring 4 mm Patient will need another mammogram this year.  I sent a new order to the breast center. Physical examination: No palpable lumps or nodules  Anastrozole toxicities: hair loss   RTC in 1 year for follow-up  Orders Placed This Encounter  Procedures   MM DIAG BREAST TOMO BILATERAL    Standing Status:   Future  Standing Expiration Date:   09/07/2022    Order Specific Question:   Reason for Exam (SYMPTOM  OR DIAGNOSIS REQUIRED)    Answer:   on neoadjuvant anti estrogen therapy    Order Specific Question:   Preferred imaging location?    Answer:   [facility]    Order Specific Question:   Release to patient    Answer:   Immediate    The patient has a good understanding of the overall plan. she agrees with it. she will call with any problems that may develop before the next visit here.  Total time spent: 20 mins including face to face time and time spent for planning, charting and coordination of care  Rulon Eisenmenger, MD, MPH 09/07/2021  I, Thana Ates, am acting as scribe for Dr. Nicholas Lose.  I have reviewed the above documentation for accuracy and completeness, and I agree with the above.

## 2021-09-07 ENCOUNTER — Inpatient Hospital Stay (HOSPITAL_BASED_OUTPATIENT_CLINIC_OR_DEPARTMENT_OTHER): Payer: Medicare Other | Admitting: Hematology and Oncology

## 2021-09-07 ENCOUNTER — Other Ambulatory Visit: Payer: Self-pay

## 2021-09-07 ENCOUNTER — Inpatient Hospital Stay: Payer: Medicare Other | Attending: Hematology and Oncology

## 2021-09-07 DIAGNOSIS — C50311 Malignant neoplasm of lower-inner quadrant of right female breast: Secondary | ICD-10-CM

## 2021-09-07 DIAGNOSIS — Z79811 Long term (current) use of aromatase inhibitors: Secondary | ICD-10-CM | POA: Diagnosis not present

## 2021-09-07 DIAGNOSIS — Z170 Estrogen receptor positive status [ER+]: Secondary | ICD-10-CM | POA: Diagnosis not present

## 2021-09-07 LAB — CMP (CANCER CENTER ONLY)
ALT: 16 U/L (ref 0–44)
AST: 17 U/L (ref 15–41)
Albumin: 3.6 g/dL (ref 3.5–5.0)
Alkaline Phosphatase: 112 U/L (ref 38–126)
Anion gap: 10 (ref 5–15)
BUN: 38 mg/dL — ABNORMAL HIGH (ref 8–23)
CO2: 28 mmol/L (ref 22–32)
Calcium: 8.7 mg/dL — ABNORMAL LOW (ref 8.9–10.3)
Chloride: 104 mmol/L (ref 98–111)
Creatinine: 1.88 mg/dL — ABNORMAL HIGH (ref 0.44–1.00)
GFR, Estimated: 26 mL/min — ABNORMAL LOW (ref >60)
Glucose, Bld: 165 mg/dL — ABNORMAL HIGH (ref 70–99)
Potassium: 4.3 mmol/L (ref 3.5–5.1)
Sodium: 142 mmol/L (ref 135–145)
Total Bilirubin: 0.3 mg/dL (ref 0.3–1.2)
Total Protein: 7.2 g/dL (ref 6.5–8.1)

## 2021-09-07 LAB — CBC WITH DIFFERENTIAL (CANCER CENTER ONLY)
Abs Immature Granulocytes: 0.01 10*3/uL (ref 0.00–0.07)
Basophils Absolute: 0 10*3/uL (ref 0.0–0.1)
Basophils Relative: 1 %
Eosinophils Absolute: 0.2 10*3/uL (ref 0.0–0.5)
Eosinophils Relative: 4 %
HCT: 34.6 % — ABNORMAL LOW (ref 36.0–46.0)
Hemoglobin: 11 g/dL — ABNORMAL LOW (ref 12.0–15.0)
Immature Granulocytes: 0 %
Lymphocytes Relative: 28 %
Lymphs Abs: 1.2 10*3/uL (ref 0.7–4.0)
MCH: 30.3 pg (ref 26.0–34.0)
MCHC: 31.8 g/dL (ref 30.0–36.0)
MCV: 95.3 fL (ref 80.0–100.0)
Monocytes Absolute: 0.4 10*3/uL (ref 0.1–1.0)
Monocytes Relative: 11 %
Neutro Abs: 2.3 10*3/uL (ref 1.7–7.7)
Neutrophils Relative %: 56 %
Platelet Count: 115 10*3/uL — ABNORMAL LOW (ref 150–400)
RBC: 3.63 MIL/uL — ABNORMAL LOW (ref 3.87–5.11)
RDW: 13.7 % (ref 11.5–15.5)
WBC Count: 4.1 10*3/uL (ref 4.0–10.5)
nRBC: 0 % (ref 0.0–0.2)

## 2021-09-07 MED ORDER — ANASTROZOLE 1 MG PO TABS: ORAL_TABLET | ORAL | 3 refills | Status: DC

## 2021-09-07 NOTE — Assessment & Plan Note (Signed)
11/19/2018:Palpable right breast mass with calcifications, mammogram revealed focal asymmetry lower right breast 5:00 measuring 4.6 cm, ultrasound revealed overall size of 5.7 cm ultrasound-guided biopsy revealed grade 2 IDC ER 90% PR 80% Ki-67 5%, HER-2 negative, T3N0 stage IIa clinical stage  Treatment plan: 1.Neoadjuvant antiestrogen therapy with anastrozolestarted 11/28/2018 2.  Patient went to [facility] and they felt that she could undergo surgery.  She was seen by Dr. Donne Hazel recently.    Mammogram and ultrasound 05/01/2020: Stable appearance of right breast focal asymmetry 4 x 2.6 x 2.2 cm, ultrasound revealed hypoechoic mass at 5 o'clock position 2 cm from nipple measuring 4 mm  Anastrozole toxicities: hair loss  RTC in 1 year for follow-up

## 2021-09-30 ENCOUNTER — Ambulatory Visit: Payer: Medicare Other | Admitting: Neurology

## 2021-10-19 ENCOUNTER — Ambulatory Visit
Admission: RE | Admit: 2021-10-19 | Discharge: 2021-10-19 | Disposition: A | Discharge status: Home / Self Care | Payer: 59 | Source: Ambulatory Visit | Attending: Hematology and Oncology | Admitting: Hematology and Oncology

## 2021-10-19 DIAGNOSIS — Z170 Estrogen receptor positive status [ER+]: Secondary | ICD-10-CM

## 2021-10-26 ENCOUNTER — Other Ambulatory Visit: Payer: Self-pay

## 2021-10-26 ENCOUNTER — Ambulatory Visit (INDEPENDENT_AMBULATORY_CARE_PROVIDER_SITE_OTHER): Payer: Medicare Other | Admitting: Neurology

## 2021-10-26 ENCOUNTER — Encounter: Payer: Self-pay | Admitting: Neurology

## 2021-10-26 VITALS — BP 163/81 | HR 60 | Ht 60.0 in | Wt 193.4 lb

## 2021-10-26 DIAGNOSIS — R4701 Aphasia: Secondary | ICD-10-CM | POA: Diagnosis not present

## 2021-10-26 NOTE — Progress Notes (Signed)
[facility]    Provider:  Dr Jaynee Eagles Referring Provider: Jimmey Ralph * Primary Care PhysicianSubbiah, Murugananthan *:    CC:  stoke and aphasia  HPI October 26, 2021: [name] is an 88 year old female here as a referral from [place], [place] *for stroke and hemifacial spasm.  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  She is unable to speak or has difficulty speaking due to increased hemifacial spasms.  She also has a history of hemiplegia and hemiparesis following cerebral infarction affecting the right dominant side, secondary hyperparathyroidism due to chronic kidney disease, long-term insulin, depression, psychotherapy was initiated January 2022, all of this information above from review of chart.  She also has neuropathy due to diabetes.  She is on Lyrica.  She has an ongoing hypercoagulable state secondary to recurrent DVTs and history of CVA on Eliquis and she is monitored every 3 months.  Secondary thrombophilia.  She also has vascular dementia without behavioral disturbance.  MMSE was 2530 with chronic progressive decline expected, continue supportive care with ADLs, she has been to PT OT for functional decline.  She also has malignant neoplasm of unspecified site of female breast.  She is on amiodarone daily except for Sundays, cardiology follow-up, stable as far as her cardiac concerns, over the last several months, more processing, more aphasia, she  has hemifacial spasms and we discussed botox. Aphasia started Also it was acute, maybe it was a stroke not seen on MRI. Can be episodic then improve back to baseline. Itis frustrating, granddaughter provides most information.  Patient complains of symptoms per HPI as well as the following symptoms: speech difficulty . Pertinent negatives and positives per HPI. All others negative  06/2020 [id number] 9 08/2021 cbc with mild anemia and thrombocytopenia Cmp bun 38 ckd 1.88  MRI brain 2020 personally reviewed and agree:  Normal examination for age. No acute or reversible finding. Very minimal small vessel change of the hemispheric deep white matter, less than often seen at this age.  CT head and cervical spine 09/06/2020: IMPRESSION: 1. No evidence of acute intracranial abnormality. 2. No evidence of acute fracture or traumatic subluxation in the cervical spine. 3. Progressive severe disc degeneration at C3-4 with severe spinal stenosis. 4. Mild interlobular septal thickening and mosaic attenuation in the lung apices, query mild edema.     HPI 08/29/2017:  [name] is a 88 y.o. female here as a referral from Dr. Rosey Bath for stuttering and speech slurring. PMHx SVT, obesity, LBBB, HTN, DVT, DM, clotting disorder, CKD, CHF.  She has a history of strokes. She is having expressive aphasia, dysphagia, not improving overall. Here with daughter who provides much information. She has to think about it, knows what she wants to say but can't get it out. No new weakness, numbness, facial droop, vision changes, new difficulty walking. It was acute onset, she woke up one morning and couldn't talk, couldn't swallow. Since then symptoms. She takes her coumadin. When she tests her coumadin it is always between 2-2.5. She checks it once a week but did not have the machine 5  months ago. The food wont go all the way down, coughing with food, no drooling. No recent falls, no trauma, no inciting events, last fall was  in 2016. No memory loss, daughter here and also agrees.   Reviewed notes, labs and imaging from outside physicians, which showed:  CT head personally reviews images and agree with the following:  Brain: No evidence of acute infarction, hemorrhage, hydrocephalus, extra-axial collection or mass lesion/mass effect.   Vascular: No hyperdense vessel or unexpected calcification.   Skull: Normal. Negative for fracture or focal lesion.   Sinuses/Orbits: No acute finding.   Other: None.   IMPRESSION: No acute abnormality noted.  MRI brain/MRA head 11/17/2017: IMPRESSION:  This MRI of the brain without contrast shows the following: 1.    Mild chronic microvascular ischemic changes.  2.    There are no acute findings. MRA was normal  08/2020: IMPRESSION: 1. No evidence of acute intracranial abnormality. 2. No evidence of acute fracture or traumatic subluxation in the cervical spine. 3. Progressive severe disc degeneration at C3-4 with severe spinal stenosis. 4. Mild interlobular septal thickening and mosaic attenuation in the lung apices, query mild edema.  Review of Systems: Patient complains of symptoms per HPI as well as the following symptoms: difficulty speaking and swallowing, joint pain. Pertinent negatives and positives per HPI. All others negative.   Social History   Socioeconomic History   Marital status: Widowed    Spouse name: Not on file   Number of children: Not on file   Years of education: Not on file   Highest education level: Not on file  Occupational History   Occupation: retired  Tobacco Use   Smoking status: Never   Smokeless tobacco: Never  Vaping Use   Vaping Use: Never used  Substance and Sexual Activity   Alcohol use: No   Drug use: No   Sexual activity: Not Currently  Other Topics Concern   Not on file  Social History Narrative   Lives at home with grandchild, daughter comes and help    Social Determinants of Health   Financial Resource  Strain: Not on file  Food Insecurity: Not on file  Transportation Needs: Not on file  Physical Activity: Not on file  Stress: Not on file  Social Connections: Not on file  Intimate Partner Violence: Not on file    Family History  Problem Relation Age of Onset   Stroke Mother    Cancer Father        prostate   Hypertension Daughter    Heart attack Brother        x2   Hypertension Brother    Stroke Sister    Hypertension Sister    Breast cancer Maternal Aunt     Past Medical History:  Diagnosis Date   Abnormal echocardiogram    a. possible mass on echo 05/2019 on PPM, b. no evidence of mass / atrial lead vegetation on follow up echo 06/2019   Anemia    Arthritis    Breast cancer ([place]) 10/2018   Invasive ductal carcinoma   Cancer ([place])    Cataract    Chronic combined systolic and diastolic CHF (congestive heart failure) ([place])    a. Previously diastolic but patient AVS from outside hospital listed systolic CHF and cardiomyopathy, records pending.   CKD (chronic kidney disease), stage III (HCC)    Clotting disorder ([place])    CVA (cerebral vascular accident) ([place])    residual right sided weakness and mild dysphagia  Diabetes mellitus ([place])    Dizziness and giddiness    DVT (deep venous thrombosis) (HCC)    a. on anticoagulation for this.   Essential hypertension    LBBB (left bundle branch block)    Mitral regurgitation    a. Mod MR by echo 2014.   Muscular deconditioning    Obesity    Pacemaker    Pericardial effusion    SVT (supraventricular tachycardia) ([place])    a. In 2013 she had an EPS with ablation for SVT which did not eliminate the SVT completely. She also had bradycardia which limited medication. She was placed on amiodarone by Dr. Lovena Le.   Thrombocytopenia ([place]) 11/21/2011    Past Surgical History:  Procedure Laterality Date   BREAST BIOPSY Right 11/19/2018   positive   EYE SURGERY     SUPRAVENTRICULAR TACHYCARDIA ABLATION N/A 10/11/2012   Procedure:  SUPRAVENTRICULAR TACHYCARDIA ABLATION;  Surgeon: Evans Lance, MD;  Location: [facility] CATH LAB;  Service: Cardiovascular;  Laterality: N/A;    Current Outpatient Medications  Medication Sig Dispense Refill   amiodarone (PACERONE) 200 MG tablet Take one tablet by mouth daily Monday-Saturday. Do NOT take Sunday 90 tablet 3   anastrozole (ARIMIDEX) 1 MG tablet TAKE 1 TABLET(1 MG) BY MOUTH DAILY 90 tablet 3   atorvastatin (LIPITOR) 20 MG tablet TAKE 1 TABLET(20 MG) BY MOUTH AT BEDTIME 90 tablet 2   diclofenac sodium (VOLTAREN) 1 % GEL Apply 2 g topically 4 (four) times daily. 100 g 1   Docusate Sodium 100 MG capsule Take 100 mg by mouth daily.     Dulaglutide (TRULICITY) 7.51 ZG/0.1VC SOPN See admin instructions.     ELIQUIS 2.5 MG TABS tablet TAKE 1 TABLET(2.5 MG) BY MOUTH TWICE DAILY 60 tablet 2   ferrous sulfate 325 (65 FE) MG tablet Take 325 mg by mouth daily with breakfast.     HUMALOG KWIKPEN 100 UNIT/ML KwikPen Inject 0-12 Units into the skin See admin instructions. Inject 0-12 units subcutaneously 3 times daily per sliding scale: BG 0-150 = 0 units BG 151-200 = 2 units BG 201-250 = 4 units BG 251-300 = 6 units BG 301-350 = 8 units BG 351-400 = 10 units BG 401+ = 12 units     insulin glargine (LANTUS) 100 unit/mL SOPN Inject 15 Units into the skin at bedtime.     lisinopril (ZESTRIL) 5 MG tablet Take 1 tablet (5 mg total) by mouth daily. 30 tablet 0   metoprolol succinate (TOPROL-XL) 25 MG 24 hr tablet Take 1 tablet (25 mg total) by mouth daily. 30 tablet 0   mirtazapine (REMERON) 7.5 MG tablet TAKE 1 TABLET(7.5 MG) BY MOUTH AT BEDTIME 90 tablet 1   omeprazole (PRILOSEC) 20 MG capsule Take 20 mg by mouth See admin instructions. Take 1 tablet by mouth at 6AM, 8AM, and 4PM.     oxybutynin (DITROPAN) 5 MG tablet TAKE 1 TABLET(5 MG) BY MOUTH TWICE DAILY 180 tablet 1   potassium chloride (KLOR-CON) 10 MEQ tablet Take 4 tablets by mouth daily.     pregabalin (LYRICA) 75 MG capsule Take 1 capsule (75  mg total) by mouth at bedtime. 90 capsule 1   torsemide (DEMADEX) 20 MG tablet Take 1 tablet (20 mg total) by mouth 2 (two) times daily. You may take an extra 20 mg tablet daily as needed for swelling 30 tablet 0   No current facility-administered medications for this visit.    Allergies as of 10/26/2021 - Review Complete 10/26/2021  Allergen Reaction Noted   Aspirin Itching 01/07/2009   Penicillins Itching and Rash 01/07/2009    Vitals: BP (!) 163/81    Pulse 60    Ht 5' (1.524 m)    Wt 193 lb 6.4 oz (87.7 kg)    BMI 37.77 kg/m  Last Weight:  Wt Readings from Last 1 Encounters:  10/26/21 193 lb 6.4 oz (87.7 kg)   Last Height:   Ht Readings from Last 1 Encounters:  10/26/21 5' (1.524 m)    Physical exam: Exam: Gen: NAD, conversant                  CV: distant heart sounds, appears regular, no MRG. No Carotid Bruits. +peripheral edema, warm, nontender Eyes: Conjunctivae clear without exudates or hemorrhage  Neuro: Detailed Neurologic Exam  Speech:   Expressive aphasia and dysarthria (adentulous also) Cognition:     The patient is oriented to person, place, and time;     recent and remote memory impaired;     language expressive aphasic;     Impaired attention, concentration, fund of knowledge Cranial Nerves:    The pupils are pinpoint.  Attempted fundoscopic exam could not visualize.  Visual fields are full to threat in 4 quadrants. Extraocular movements are intact. Trigeminal sensation is intact and the muscles of mastication are normal. The face is symmetric. The palate elevates in the midline. Hearing impaired. Voice is normal. Shoulder shrug is normal. The tongue has normal motion without fasciculations.   Coordination:    No dysmetria  Gait:    Difficulty with exam, in a wheelchair, significant LE swelling moving all etremities antigravity, right-sided weakness with arm and leg drift  Motor Observation:    no involuntary movements noted. Tone:    Normal muscle  tone.    Posture:    Posture is stooped in wheelchair    Strength: right pronator drift, right mild hemiparesis throughout, left proximal mild weakness deltoid and left hip flexion      Sensation: impaired distally to all modalities     Reflex Exam:  DTR's:   Absent lowers,  Deep tendon reflexes in the upper and lower extremities are symmetrical bilaterally 1+ bieps Toes:    The toes are equivocal bilaterally.   Clonus:    Clonus is absent.      Assessment/Plan: [name] is an 88 year old female with a very complicated PMHx here as a referral from [place], [place] *for stroke and hemifacial spasm.  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    She also has a history of hemiplegia and hemiparesis following cerebral infarction affecting the right dominant side(however not seen on MRIs/CTs), secondary hyperparathyroidism due to chronic kidney disease, long-term insulin, depression, psychotherapy was initiated January 2022.  She also has neuropathy due to diabetes.  She is on Lyrica. MMSE was 2530. Also severe cervical stenosis.  I saw patient in 2018 for aphasia/dysarthria. Since then she has been stable, waxes and wanes, MRI of the brain and MRA of the head at the time was normal despite hemiparesis due to reported stroke. more Recent MRIs and CTs have not shown any  acute changes. Possible old stroke not able to be seen on MRI, speech symptoms could be due to stroke, also primary progressive aphasia is a consideration. I had a long talk with  patient and granddaughter as symptoms ongoing for 5 years, and given all her medical conditions we decided on conservative measures, trying to improve her quality of life, not pursuing anything invasive. She is following with ENT for dysphagia and endoscopy. At this time I recommend continued speech therapy and we can continue to monitor. Continue Eliquis for secondary stroke prevention and hypercoag disorder. I would not intervene on severe cervical stenosis with her health but would be very careful about falls.  F/u as needed  Orders Placed This Encounter  Procedures   Ambulatory referral to Speech Therapy   Cc: [place], Murugananthan *:    Sarina Ill, MD  [facility] [address] [place], [place] [address]  Phone [phone] Fax [phone]  I spent 90 minutes of face-to-face and non-face-to-face time with patient on the  1. Expressive aphasia    diagnosis.  This included previsit chart review, lab review, study review, order entry, electronic health record documentation, patient education on the different diagnostic and therapeutic options, counseling and coordination of care, risks and benefits of management, compliance, or risk factor reduction

## 2021-10-26 NOTE — Patient Instructions (Addendum)
Primary Progressive Aphasia:  Overview Illustration of functions of the brain  Functions of the brainOpen pop-up dialog box Primary progressive aphasia ([id number]-zhuh) is a rare nervous system (neurological) syndrome that affects your ability to communicate. People who have it can have trouble expressing their thoughts and understanding or finding words.  Symptoms begin gradually, often before age 86, and worsen over time. People with primary progressive aphasia can lose the ability to speak and write and, eventually, to understand written or spoken language.  This condition progresses slowly, so you may continue caring for yourself and participating in daily life activities for several years after the disorder's onset.  Primary progressive aphasia is a type of frontotemporal dementia, a cluster of related disorders that results from the degeneration of the frontal or temporal lobes of the brain, which include brain tissue involved in speech and language.  Symptoms Primary progressive aphasia symptoms vary, depending on which portion of the brain's language areas are involved. The condition has three types, which cause different symptoms.  Semantic variant primary progressive aphasia Signs and symptoms include:  Difficulty comprehending spoken or written language, particularly single words Trouble comprehending word meanings Struggling to name objects Logopenic variant primary progressive aphasia Signs and symptoms include:  Difficulty retrieving words and word substitutions Frequently pausing in speech while searching for words Difficulty repeating phrases or sentences Nonfluent-agrammatic variant primary progressive aphasia Signs and symptoms include:  Poor grammar in written and spoken form Trouble understanding complex sentences Using grammar incorrectly May be accompanied by speaking problems such as errors in speech sounds (known as apraxia of speech)  Causes Primary  progressive aphasia is caused by a shrinking (atrophy) of certain sections (lobes) of the brain responsible for speech and language. In this case, the frontal, temporal or parietal lobes, primarily on the left side of the brain, are affected.  Atrophy is associated with the presence of abnormal proteins, and brain activity or function in affected areas might be reduced.  Risk factors Risk factors for primary progressive aphasia include:  Learning disabilities. If you had a childhood learning disability, particularly developmental dyslexia, you might be at somewhat higher risk of primary progressive aphasia. Certain gene mutations. Rare gene mutations have been linked to the disorder. If other members of your family have had primary progressive aphasia, you might be more likely to develop it. Complications People with primary progressive aphasia eventually lose the ability to speak and write, and to understand written and spoken language. Some people develop substantial difficulty forming sounds to speak (a problem called apraxia of speech), even when their ability to write and comprehend are not significantly impaired.  As the disease progresses, other mental skills, such as memory, can become impaired. Some people develop other neurological symptoms such as problems with movement. With these complications, the affected person eventually will need help with day-to-day care.  People with primary progressive aphasia can also develop depression or behavioral or social problems as the disease progresses. Other problems might include blunted emotions such as unconcern, poor judgment or inappropriate social behavior.

## 2021-11-04 ENCOUNTER — Ambulatory Visit (INDEPENDENT_AMBULATORY_CARE_PROVIDER_SITE_OTHER): Payer: Medicare Other

## 2021-11-04 DIAGNOSIS — I442 Atrioventricular block, complete: Secondary | ICD-10-CM

## 2021-11-04 LAB — CUP PACEART REMOTE DEVICE CHECK
Date Time Interrogation Session: 20230111135811
Implantable Lead Implant Date: 20200624
Implantable Lead Implant Date: 20200624
Implantable Lead Location: 753859
Implantable Lead Location: 753860
Implantable Lead Model: 377
Implantable Lead Model: 377
Implantable Lead Serial Number: 81195510
Implantable Pulse Generator Implant Date: 20200624
Lead Channel Setting Pacing Amplitude: 1.8 V
Lead Channel Setting Pacing Amplitude: 3 V
Lead Channel Setting Pacing Pulse Width: 0.4 ms
Pulse Gen Model: 407145
Pulse Gen Serial Number: 69638782

## 2021-11-14 NOTE — Progress Notes (Addendum)
Cardiology Office Note   Date:  11/15/2021   ID:  [name], DOB 12-09-1932, MRN [id number]  PCP:  Pcp, No    No chief complaint on file.  AFib, Chronic diastolic heart failure  Wt Readings from Last 3 Encounters:  11/15/21 196 lb (88.9 kg)  10/26/21 193 lb 6.4 oz (87.7 kg)  09/07/21 186 lb 6.4 oz (84.6 kg)       History of Present Illness: [name] is a 88 y.o. female  who has had diastolic dysfunction in the past. She is on Coumadin for DVT. She has had atrial arrhythmias and chronic [name] edema.   In addition to the history of h/o DVT (on Coumadin for this), chronic diastolic CHF, she also has deconditioning, obesity, HTN, SVT (correlated with pre-syncope, on amiodarone), DM, chronic anemia/thrombocytopenia, LBBB, probable CKD stage III (Cr 1.3 in 08/2014), mod MR by echo 2014 who presents for f/u. Per notes in 2013 she had an EPS with ablation for SVT which did not eliminate the SVT completely. She also had bradycardia which limited medication. She was placed on amiodarone by Dr. Lovena [name]. Last echo 07/2013: mild LVH, moderate focal basal hypertrophy of septum, EF 55-60%, grade 1 DD, mod MR, mod LAE, PASP 40.   She sustained a mechanical fall in 07/2015 and was in the hospital for a week.   She was seen in [facility] 2017 for fluid overload.  Lasix was increased and potassium was increased.    In 2018, there was a concern regarding stroke as she developed some stuttering and swallowing difficulties.  Her workup has been unrevealing at this time.  We checked carotid Doppler and echocardiogram.   Prior records show: "She followed with nephrology and cardiology in [place] and also has established care with [facility] as well. There are records in [place] but these only appear to be patient discharge instructions rather than provider notes.   She was admitted 06/08/2019 with shortness of breath and arm pain, found to have acute on chronic diastolic CHF requiring  diuresis. Her troponin was elevated to a peak of 310, felt due to demand ischemia in absence of true angina. Left arm pain was felt MSK, supported by plain films. No further ischemic workup was recommended. Age adjusted D-dimer was felt wnl. Per Dr. Lovena [name], device appeared to be working normally and did not need it interrogated during admission but routine EP f/u was recommended. 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 Per Dr. Francesca [name] note, "In the absence of fever or sign of sepsis I would not pursue further work-up at this point given the patient age and comorbidities.  I would consider TEE once any signs of infection." She recommended to restart oral Bumex after a 1 day holiday from diuresis. F/u Cr was still elevated on f/u labs 8/31 so lisinopril was discontinued. F/u BMET 06/28/19 showed K 4.5, Cr 1.87 (previous range variable from 1.4-2.3). Otherwise recent labs 05/2019 showed Hgb 8.9 (variable in 9-10 range this year), Plt 105 (variable in 2020), WBC 2.4, A1C 7.9, 07/2018 LDL 96.    She had a pacer placed in [place], and has f/u in our PM clinic scheduled. There was a concern for a lead infection.  She was seen by Dr. Lovena [name].  Pacer was functioning normally.  Due to lack of symptoms of infection, no TEE was done.  She was diagnosed with breast cancer and the plan was for her to have surgery in [place].  Ultimately, she did not have surgery and is being treated with anastrozole.  There was a concern about putting her to sleep.   She has been bothered by arthritis in her right shoulder.    Records from 12/21 hospital admission show: " Covid 19 infection on 06/12/2020 comes into the hospital for shortness of breath that started 3 days prior to admission, in the ED he was found to have a temperature of 100.5 breathing 20 times per minute  with a white count of 4 hemoglobin of 10 lactic acid of 1.4 chest x-ray showed new infiltrates in both lung fields influenza and SARS-CoV-2 PCR were negative, he was started on IV Lasix Rocephin and doxy.  Empiric antibiotics were discontinued she was started on aggressive IV diuresis she is diuresed about 3 L and her breathing is improving along with her weight which is coming down."   1/22 pacer check showed: "Alert for AF burden 100% on 11/12/20, Elevated to > 255 since 09/30/20. Patient just d/ced from hospital for CHF exacerbation 10/13/20. Spoke with [name](daughter) per DPR, and she reports patient has been eating high sodium foods and drinking increased amount o liquids sine returning to long term care facility. She reports increased BLE edema. Daughter concerned that nursing facility does not have patient on fluid restriction or low NA diet."     She had a visit in Jan 2022 from a nursing home:  "Wants to have her orders allow for 1 extra torsemide pill, 20 mg as needed for swelling"  Past Medical History:  Diagnosis Date   Abnormal echocardiogram    a. possible mass on echo 05/2019 on PPM, b. no evidence of mass / atrial lead vegetation on follow up echo 06/2019   Anemia    Arthritis    Breast cancer ([place]) 10/2018   Invasive ductal carcinoma   Cancer ([place])    Cataract    Chronic combined systolic and diastolic CHF (congestive heart failure) ([place])    a. Previously diastolic but patient AVS from outside hospital listed systolic CHF and cardiomyopathy, records pending.   CKD (chronic kidney disease), stage III (HCC)    Clotting disorder (H[facility])    CVA (cerebral vascular accident) ([place])    residual right sided weakness and mild dysphagia   Diabetes mellitus ([place])    Dizziness and giddiness    DVT (deep venous thrombosis) ([place])    a. on anticoagulation for this.   Essential hypertension    LBBB (left bundle branch block)    Mitral regurgitation    a. Mod MR by echo 2014.   Muscular  deconditioning    Obesity    Pacemaker    Pericardial effusion    SVT (supraventricular tachycardia) ([place])    a. In 2013 she had an EPS with ablation for SVT which did not eliminate the SVT completely. She also had bradycardia which limited medication. She was placed on amiodarone by Dr. Lovena [name].   Thrombocytopenia ([place]) 11/21/2011    Past Surgical History:  Procedure Laterality Date   BREAST BIOPSY Right 11/19/2018   positive   EYE SURGERY     SUPRAVENTRICULAR TACHYCARDIA ABLATION N/A 10/11/2012   Procedure: SUPRAVENTRICULAR TACHYCARDIA ABLATION;  Surgeon: Evans Lance, MD;  Location: [facility] CATH LAB;  Service: Cardiovascular;  Laterality: N/A;     Current Outpatient Medications  Medication Sig Dispense Refill   amiodarone (PACERONE) 200 MG tablet Take  one tablet by mouth daily Monday-Saturday. Do NOT take Sunday 90 tablet 3   anastrozole (ARIMIDEX) 1 MG tablet TAKE 1 TABLET(1 MG) BY MOUTH DAILY 90 tablet 3   atorvastatin (LIPITOR) 20 MG tablet TAKE 1 TABLET(20 MG) BY MOUTH AT BEDTIME 90 tablet 2   diclofenac sodium (VOLTAREN) 1 % GEL Apply 2 g topically 4 (four) times daily. 100 g 1   Docusate Sodium 100 MG capsule Take 100 mg by mouth daily.     Dulaglutide (TRULICITY) 2.77 OE/4.2PN SOPN See admin instructions.     ELIQUIS 2.5 MG TABS tablet TAKE 1 TABLET(2.5 MG) BY MOUTH TWICE DAILY 60 tablet 2   esomeprazole (NEXIUM) 40 MG packet Take 40 mg by mouth daily before breakfast.     ferrous sulfate 325 (65 FE) MG tablet Take 325 mg by mouth daily with breakfast.     HUMALOG KWIKPEN 100 UNIT/ML KwikPen Inject 0-12 Units into the skin See admin instructions. Inject 0-12 units subcutaneously 3 times daily per sliding scale: BG 0-150 = 0 units BG 151-200 = 2 units BG 201-250 = 4 units BG 251-300 = 6 units BG 301-350 = 8 units BG 351-400 = 10 units BG 401+ = 12 units     insulin glargine (LANTUS) 100 unit/mL SOPN Inject 15 Units into the skin at bedtime.     lisinopril (ZESTRIL) 5 MG tablet  Take 1 tablet (5 mg total) by mouth daily. 30 tablet 0   Menthol, Topical Analgesic, (BIOFREEZE ROLL-ON) 4 % GEL Apply 1 application topically 3 (three) times daily.     metoprolol succinate (TOPROL-XL) 25 MG 24 hr tablet Take 1 tablet (25 mg total) by mouth daily. 30 tablet 0   oxybutynin (DITROPAN) 5 MG tablet TAKE 1 TABLET(5 MG) BY MOUTH TWICE DAILY 180 tablet 1   potassium chloride (KLOR-CON) 10 MEQ tablet Take 4 tablets by mouth daily.     pregabalin (LYRICA) 75 MG capsule Take 1 capsule (75 mg total) by mouth at bedtime. 90 capsule 1   torsemide (DEMADEX) 20 MG tablet Take 1 tablet (20 mg total) by mouth 2 (two) times daily. You may take an extra 20 mg tablet daily as needed for swelling 30 tablet 0   No current facility-administered medications for this visit.    Allergies:   Aspirin and Penicillins    Social History:  The patient  reports that she has never smoked. She has never used smokeless tobacco. She reports that she does not drink alcohol and does not use drugs.   Family History:  The patient's family history includes Breast cancer in her maternal aunt; Cancer in her father; Heart attack in her brother; Hypertension in her brother, daughter, and sister; Stroke in her mother and sister.    ROS:  Please see the history of present illness.   Otherwise, review of systems are positive for .   All other systems are reviewed and negative.    PHYSICAL EXAM: VS:  BP 138/72    Pulse 60    Ht 5\' 5"  (1.651 m)    Wt 196 lb (88.9 kg)    SpO2 98%    BMI 32.62 kg/m  , BMI Body mass index is 32.62 kg/m. GEN: Well nourished, well developed, in no acute distress, frail HEENT: normal Neck: no JVD, carotid bruits, or masses Cardiac: RRR; no murmurs, rubs, or gallops,; bilateral lower extremity edema left greater than right Respiratory:  clear to auscultation bilaterally, normal work of breathing GI: soft, nontender, nondistended, +  BS MS: no deformity or atrophy Skin: warm and dry, no  rash Neuro:  Strength and sensation are intact Psych: euthymic mood, full affect   EKG:   The ekg ordered in [place] 2022 demonstrates V paced rhythm   Recent Labs: 09/07/2021: ALT 16; BUN 38; Creatinine 1.88; Hemoglobin 11.0; Platelet Count 115; Potassium 4.3; Sodium 142   Lipid Panel    Component Value Date/Time   CHOL 195 08/15/2018 1236   TRIG 56 01/02/2019 1425   HDL 81 08/15/2018 1236   CHOLHDL 2.4 08/15/2018 1236   CHOLHDL 2.6 12/11/2008 0130   VLDL 13 12/11/2008 0130   LDLCALC 96 08/15/2018 1236     Other studies Reviewed: Additional studies/ records that were reviewed today with results demonstrating: labs reviewed.   ASSESSMENT AND PLAN:  Chronic systolic/diastolic heart failure: septal dyssynchrony.  EF 30% in the setting of COVID in 2021.  Mild volume overload, mostly in her legs.  Elevate legs.  Compression stockings.  Torsemide dose being adjusted between 20 mg daily and 20 mg twice daily.  Given CKD, bmet needs to be followed closely.  Would give a second dose of torsemide at 3 PM to minimize urinary frequency at night during sleep.  Not a candidate for more aggressive CHF treatments like Entresto , Aldactone given renal insufficiency, Stable on low dose lisinopril. Hypertensive heart disease: The current medical regimen is effective;  continue present plan and medications.  Minimize salt in diet. [name] extremity edema: Elevate legs as noted above. SVT/atrial tach: Status post pacemaker.  Followed by EP.  Amiodarone for rhythm control. Anticoagulated: Last creatinine 1.8.  Given her age, she is on low-dose Eliquis. Type 2 DM: A1c 8.2 in July 2022.   Current medicines are reviewed at length with the patient today.  The patient concerns regarding her medicines were addressed.  The following changes have been made: Change timing of p.m. dose of torsemide to 3 PM  Labs/ tests ordered today include: We will try to obtain lab results from her facility. No orders of the  defined types were placed in this encounter.   Recommend 150 minutes/week of aerobic exercise Low fat, low carb, high fiber diet recommended  Disposition:   FU in 6 months   Signed, Larae Grooms, MD  11/15/2021 11:30 AM    [facility] [address], [place], [place]  [address] Phone: [phone]; Fax: [phone]

## 2021-11-15 ENCOUNTER — Encounter: Payer: Self-pay | Admitting: Interventional Cardiology

## 2021-11-15 ENCOUNTER — Ambulatory Visit (INDEPENDENT_AMBULATORY_CARE_PROVIDER_SITE_OTHER): Payer: Medicare Other | Admitting: Interventional Cardiology

## 2021-11-15 ENCOUNTER — Other Ambulatory Visit: Payer: Self-pay

## 2021-11-15 VITALS — BP 138/72 | HR 60 | Ht 65.0 in | Wt 196.0 lb

## 2021-11-15 DIAGNOSIS — Z7901 Long term (current) use of anticoagulants: Secondary | ICD-10-CM | POA: Diagnosis not present

## 2021-11-15 DIAGNOSIS — N184 Chronic kidney disease, stage 4 (severe): Secondary | ICD-10-CM

## 2021-11-15 DIAGNOSIS — I5032 Chronic diastolic (congestive) heart failure: Secondary | ICD-10-CM

## 2021-11-15 DIAGNOSIS — D6869 Other thrombophilia: Secondary | ICD-10-CM

## 2021-11-15 DIAGNOSIS — Z794 Long term (current) use of insulin: Secondary | ICD-10-CM

## 2021-11-15 DIAGNOSIS — I471 Supraventricular tachycardia, unspecified: Secondary | ICD-10-CM

## 2021-11-15 DIAGNOSIS — Z950 Presence of cardiac pacemaker: Secondary | ICD-10-CM | POA: Diagnosis not present

## 2021-11-15 DIAGNOSIS — E1122 Type 2 diabetes mellitus with diabetic chronic kidney disease: Secondary | ICD-10-CM

## 2021-11-15 DIAGNOSIS — N183 Chronic kidney disease, stage 3 unspecified: Secondary | ICD-10-CM

## 2021-11-15 DIAGNOSIS — I110 Hypertensive heart disease with heart failure: Secondary | ICD-10-CM

## 2021-11-15 NOTE — Patient Instructions (Signed)
Medication Instructions:  °Your physician recommends that you continue on your current medications as directed. Please refer to the Current Medication list given to you today. ° °*If you need a refill on your cardiac medications before your next appointment, please call your pharmacy* ° ° °Lab Work: °none °If you have labs (blood work) drawn today and your tests are completely normal, you will receive your results only by: °MyChart Message (if you have MyChart) OR °A paper copy in the mail °If you have any lab test that is abnormal or we need to change your treatment, we will call you to review the results. ° ° °Testing/Procedures: °none ° ° °Follow-Up: °At [facility], you and your health needs are our priority.  As part of our continuing mission to provide you with exceptional heart care, we have created designated Provider Care Teams.  These Care Teams include your primary Cardiologist (physician) and Advanced Practice Providers (APPs -  Physician Assistants and Nurse Practitioners) who all work together to provide you with the care you need, when you need it. ° °We recommend signing up for the patient portal called "MyChart".  Sign up information is provided on this After Visit Summary.  MyChart is used to connect with patients for Virtual Visits (Telemedicine).  Patients are able to view lab/test results, encounter notes, upcoming appointments, etc.  Non-urgent messages can be sent to your provider as well.   °To learn more about what you can do with MyChart, go to [email or web address].   ° °Your next appointment:   °6 month(s) ° °The format for your next appointment:   °In Person ° °Provider:   °Jayadeep Varanasi, MD   ° ° °Other Instructions ° ° °

## 2021-11-16 NOTE — Progress Notes (Signed)
Remote pacemaker transmission.   

## 2021-11-17 ENCOUNTER — Telehealth: Payer: Self-pay | Admitting: *Deleted

## 2021-11-17 ENCOUNTER — Encounter: Payer: Self-pay | Admitting: *Deleted

## 2021-11-17 NOTE — Telephone Encounter (Signed)
Lab results and note from Tula Nakayama, NP received from [facility].  Reviewed by Dr Irish Lack. Per Dr Irish Lack it is OK to continue Eliquis.   No evidence of bleeding. Continue to follow BMP and CBC every 6 months.  Dose needs to be adjusted for renal function and age. Currently on Eliquis 2.5 mg twice daily. I spoke with Tula Nakayama, NP and gave her this information.  Will fax Dr Hassell Done note back to [facility].

## 2021-11-17 NOTE — Progress Notes (Signed)
Received call from [place] with [facility] [phone]).  States pt recent lab work showing WBC 2.8 and PLT 90.  RN reviewed with MD who has request pt come into the office to have labs re-drawn and follow up with NP.  Appt scheduled and verified with Tiffany RN who states she will assist in transportation for pt.  RN also attempt x1 to contact pt, no answer. LVM for pt to return call to the office.

## 2021-11-25 ENCOUNTER — Other Ambulatory Visit: Payer: Medicare Other

## 2021-11-25 ENCOUNTER — Ambulatory Visit: Payer: Medicare Other | Admitting: Adult Health

## 2021-11-25 ENCOUNTER — Telehealth: Payer: Self-pay | Admitting: Adult Health

## 2021-11-25 NOTE — Telephone Encounter (Signed)
Rescheduled appointment per providers template. Patient aware.  ?

## 2021-12-02 ENCOUNTER — Other Ambulatory Visit: Payer: Medicare Other

## 2021-12-02 ENCOUNTER — Ambulatory Visit: Payer: Medicare Other | Admitting: Adult Health

## 2021-12-03 ENCOUNTER — Telehealth: Payer: Self-pay | Admitting: Adult Health

## 2021-12-03 ENCOUNTER — Inpatient Hospital Stay: Payer: Medicare Other | Admitting: Adult Health

## 2021-12-03 ENCOUNTER — Inpatient Hospital Stay: Payer: Medicare Other

## 2021-12-03 NOTE — Telephone Encounter (Signed)
Sch per 2/9 inbasket, req to r/s per rn 2/10 inbasket, spoke with pt and nursing home  nurse,Kierra , informed of r/s appt, gave # to call back since nursing home scheduler was unavailable.

## 2021-12-10 ENCOUNTER — Inpatient Hospital Stay: Payer: Medicare Other | Attending: Hematology and Oncology

## 2021-12-10 ENCOUNTER — Inpatient Hospital Stay (HOSPITAL_BASED_OUTPATIENT_CLINIC_OR_DEPARTMENT_OTHER): Payer: Medicare Other | Admitting: Adult Health

## 2021-12-10 ENCOUNTER — Other Ambulatory Visit: Payer: Self-pay

## 2021-12-10 ENCOUNTER — Encounter: Payer: Self-pay | Admitting: Adult Health

## 2021-12-10 VITALS — BP 170/90 | HR 60 | Temp 97.7°F | Resp 16 | Ht 65.0 in | Wt 189.6 lb

## 2021-12-10 DIAGNOSIS — C50311 Malignant neoplasm of lower-inner quadrant of right female breast: Secondary | ICD-10-CM | POA: Insufficient documentation

## 2021-12-10 DIAGNOSIS — E1140 Type 2 diabetes mellitus with diabetic neuropathy, unspecified: Secondary | ICD-10-CM | POA: Insufficient documentation

## 2021-12-10 DIAGNOSIS — Z170 Estrogen receptor positive status [ER+]: Secondary | ICD-10-CM

## 2021-12-10 DIAGNOSIS — Z8673 Personal history of transient ischemic attack (TIA), and cerebral infarction without residual deficits: Secondary | ICD-10-CM | POA: Diagnosis not present

## 2021-12-10 DIAGNOSIS — M179 Osteoarthritis of knee, unspecified: Secondary | ICD-10-CM | POA: Insufficient documentation

## 2021-12-10 DIAGNOSIS — E1165 Type 2 diabetes mellitus with hyperglycemia: Secondary | ICD-10-CM | POA: Insufficient documentation

## 2021-12-10 DIAGNOSIS — Z79811 Long term (current) use of aromatase inhibitors: Secondary | ICD-10-CM | POA: Insufficient documentation

## 2021-12-10 DIAGNOSIS — E7800 Pure hypercholesterolemia, unspecified: Secondary | ICD-10-CM | POA: Insufficient documentation

## 2021-12-10 DIAGNOSIS — E2839 Other primary ovarian failure: Secondary | ICD-10-CM

## 2021-12-10 DIAGNOSIS — E1121 Type 2 diabetes mellitus with diabetic nephropathy: Secondary | ICD-10-CM | POA: Insufficient documentation

## 2021-12-10 LAB — CMP (CANCER CENTER ONLY)
ALT: 31 U/L (ref 0–44)
AST: 28 U/L (ref 15–41)
Albumin: 3.9 g/dL (ref 3.5–5.0)
Alkaline Phosphatase: 94 U/L (ref 38–126)
Anion gap: 7 (ref 5–15)
BUN: 39 mg/dL — ABNORMAL HIGH (ref 8–23)
CO2: 33 mmol/L — ABNORMAL HIGH (ref 22–32)
Calcium: 9.1 mg/dL (ref 8.9–10.3)
Chloride: 104 mmol/L (ref 98–111)
Creatinine: 1.86 mg/dL — ABNORMAL HIGH (ref 0.44–1.00)
GFR, Estimated: 26 mL/min — ABNORMAL LOW (ref >60)
Glucose, Bld: 119 mg/dL — ABNORMAL HIGH (ref 70–99)
Potassium: 4.1 mmol/L (ref 3.5–5.1)
Sodium: 144 mmol/L (ref 135–145)
Total Bilirubin: 0.5 mg/dL (ref 0.3–1.2)
Total Protein: 7.4 g/dL (ref 6.5–8.1)

## 2021-12-10 LAB — CBC WITH DIFFERENTIAL (CANCER CENTER ONLY)
Abs Immature Granulocytes: 0 10*3/uL (ref 0.00–0.07)
Basophils Absolute: 0 10*3/uL (ref 0.0–0.1)
Basophils Relative: 1 %
Eosinophils Absolute: 0.2 10*3/uL (ref 0.0–0.5)
Eosinophils Relative: 5 %
HCT: 36.1 % (ref 36.0–46.0)
Hemoglobin: 11.4 g/dL — ABNORMAL LOW (ref 12.0–15.0)
Immature Granulocytes: 0 %
Lymphocytes Relative: 34 %
Lymphs Abs: 1.2 10*3/uL (ref 0.7–4.0)
MCH: 29.7 pg (ref 26.0–34.0)
MCHC: 31.6 g/dL (ref 30.0–36.0)
MCV: 94 fL (ref 80.0–100.0)
Monocytes Absolute: 0.3 10*3/uL (ref 0.1–1.0)
Monocytes Relative: 10 %
Neutro Abs: 1.7 10*3/uL (ref 1.7–7.7)
Neutrophils Relative %: 50 %
Platelet Count: 109 10*3/uL — ABNORMAL LOW (ref 150–400)
RBC: 3.84 MIL/uL — ABNORMAL LOW (ref 3.87–5.11)
RDW: 13.7 % (ref 11.5–15.5)
WBC Count: 3.5 10*3/uL — ABNORMAL LOW (ref 4.0–10.5)
nRBC: 0 % (ref 0.0–0.2)

## 2021-12-10 NOTE — Progress Notes (Signed)
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, estrogen receptor positive ([name])  11/19/2018 Initial Diagnosis   Palpable right breast mass with calcifications, mammogram revealed focal asymmetry lower right breast 5:00 measuring 4.6 cm, ultrasound revealed overall size of 5.7 cm ultrasound-guided biopsy revealed grade 2 IDC ER 90% PR 80% Ki-67 5%, HER-2 negative, T3N0 stage IIa clinical stage   11/28/2018 [name] Staging   Staging form: Breast, AJCC 8th Edition - Clinical: Stage IIA (cT3, cN0, cM0, G2, ER+, PR+, HER2-) - Signed by Nicholas Lose, MD on 11/28/2018    11/28/2018 -  Neo-Adjuvant Anti-estrogen oral therapy   Anastrozole daily, no surgery planned due to comorbidities     CURRENT THERAPY: Anastrozole  INTERVAL HISTORY: [name] 88 y.o. female returns for follow-up and evaluation of her right sided breast [name].  She did not undergo surgery due to her multiple medical comorbidities.  Her most recent mammogram was completed on October 19, 2021 and showed a small residual irregular mass and noted that it was smaller than it was on October 30, 2018.  [name] is living in a rehab facility.  Notes she is tolerating the anastrozole well and has no significant issues.  She is active with working with physical therapy while in the facility.  She says that her plans to stay in the facility or long-term.   Patient Active Problem List   Diagnosis Date Noted    Diabetic neuropathy ([place]) 12/10/2021   Diabetic nephropathy ([place]) 12/10/2021   Hyperglycemia due to type 2 diabetes mellitus ([place]) 12/10/2021   Pure hypercholesterolemia 12/10/2021   Osteoarthritis of knee 12/10/2021   Complete heart block ([place]) 05/14/2021   Acute on chronic combined systolic and diastolic CHF (congestive heart failure) ([place]) 10/08/2020   GERD (gastroesophageal reflux disease) 10/08/2020   Declining functional status    Goals of care, counseling/discussion    Palliative care by specialist    Aspiration pneumonia ([place]) 07/13/2020   Hypoglycemia 07/13/2020   Elevated troponin 07/13/2020   Sepsis ([place]) 07/13/2020   Syncope and collapse 06/22/2020   COVID-19 virus infection 06/22/2020   Hypokalemia 06/22/2020   Pacemaker 05/06/2020   Syncope 08/10/2019   Displaced fracture of lateral malleolus of right fibula, initial encounter for closed fracture 07/10/2019   Pain in left shoulder 07/10/2019   Acute CHF (congestive heart failure) ([place]) 06/09/2019   Acute pulmonary edema ([place])    Personal history of breast [name] 04/22/2019   Pressure ulcer 01/04/2019   Acute pancreatitis 01/02/2019   Malignant neoplasm of lower-inner quadrant of right breast of female, estrogen receptor positive ([place]) 11/23/2018   Hematoma of right iliopsoas muscle 11/06/2018   Intramuscular hematoma 11/06/2018   Hypertensive heart disease with heart failure ([place]) 09/05/2017   Aphasia 08/31/2017   Dysphagia 08/31/2017   CKD (chronic kidney disease), stage III (HCC)    LBBB (left bundle branch block)    Anemia    HTN (hypertension)    Obesity    DVT (deep venous thrombosis) ([place])  DM (diabetes mellitus) ([place]) 07/28/2015   Fall 07/28/2015   Warfarin-induced coagulopathy ([place]) 07/28/2015   Acute on chronic diastolic CHF (congestive heart failure) ([place]) 03/19/2014   Edema 08/06/2013   SVT (supraventricular tachycardia) ([place]) 10/15/2012   First degree AV block 10/15/2012   Acute renal  insufficiency 10/14/2012   Dizziness and giddiness    Thrombocytopenia ([place]) 11/21/2011    is allergic to aspirin, penicillins, and remeron [mirtazapine].  MEDICAL HISTORY: Past Medical History:  Diagnosis Date   Abnormal echocardiogram    a. possible mass on echo 05/2019 on PPM, b. no evidence of mass / atrial lead vegetation on follow up echo 06/2019   Anemia    Arthritis    Breast [name] ([place]) 10/2018   Invasive ductal carcinoma   [name] (HCC)    Cataract    Chronic combined systolic and diastolic CHF (congestive heart failure) ([place])    a. Previously diastolic but patient AVS from outside hospital listed systolic CHF and cardiomyopathy, records pending.   CKD (chronic kidney disease), stage III (HCC)    Clotting disorder (HCC)    CVA (cerebral vascular accident) ([place])    residual right sided weakness and mild dysphagia   Diabetes mellitus ([place])    Dizziness and giddiness    DVT (deep venous thrombosis) ([place])    a. on anticoagulation for this.   Essential hypertension    LBBB (left bundle branch block)    Mitral regurgitation    a. Mod MR by echo 2014.   Muscular deconditioning    Obesity    Pacemaker    Pericardial effusion    SVT (supraventricular tachycardia) ([place])    a. In 2013 she had an EPS with ablation for SVT which did not eliminate the SVT completely. She also had bradycardia which limited medication. She was placed on amiodarone by Dr. Lovena Le.   Thrombocytopenia ([place]) 11/21/2011    SURGICAL HISTORY: Past Surgical History:  Procedure Laterality Date   BREAST BIOPSY Right 11/19/2018   positive   EYE SURGERY     SUPRAVENTRICULAR TACHYCARDIA ABLATION N/A 10/11/2012   Procedure: SUPRAVENTRICULAR TACHYCARDIA ABLATION;  Surgeon: Evans Lance, MD;  Location: [facility] CATH LAB;  Service: Cardiovascular;  Laterality: N/A;    SOCIAL HISTORY: Social History   Socioeconomic History   Marital status: Widowed    Spouse name: Not on file   Number of children: Not on  file   Years of education: Not on file   Highest education level: Not on file  Occupational History   Occupation: retired  Tobacco Use   Smoking status: Never   Smokeless tobacco: Never  Vaping Use   Vaping Use: Never used  Substance and Sexual Activity   Alcohol use: No   Drug use: No   Sexual activity: Not Currently  Other Topics Concern   Not on file  Social History Narrative   Lives at home with grandchild, daughter comes and help    Social Determinants of Health   Financial Resource Strain: Not on file  Food Insecurity: Not on file  Transportation Needs: Not on file  Physical Activity: Not on file  Stress: Not on file  Social Connections: Not on file  Intimate Partner Violence: Not on file    FAMILY HISTORY: Family History  Problem Relation Age of Onset   Stroke Mother    [name] Father        prostate   Hypertension Daughter    Heart attack Brother  x2   Hypertension Brother    Stroke Sister    Hypertension Sister    Breast [name] Maternal Aunt     Review of Systems  Constitutional:  Negative for appetite change, chills, fatigue, fever and unexpected weight change.  HENT:   Negative for hearing loss, lump/mass and trouble swallowing.   Eyes:  Negative for eye problems and icterus.  Respiratory:  Negative for chest tightness, cough and shortness of breath.   Cardiovascular:  Negative for chest pain, leg swelling and palpitations.  Gastrointestinal:  Negative for abdominal distention, abdominal pain, constipation, diarrhea, nausea and vomiting.  Endocrine: Negative for hot flashes.  Genitourinary:  Negative for difficulty urinating.   Musculoskeletal:  Negative for arthralgias.  Skin:  Negative for itching and rash.  Neurological:  Negative for dizziness, extremity weakness, headaches and numbness.  Hematological:  Negative for adenopathy. Does not bruise/bleed easily.  Psychiatric/Behavioral:  Negative for depression. The patient is not  nervous/anxious.      PHYSICAL EXAMINATION  ECOG PERFORMANCE STATUS: 3 - Symptomatic, >50% confined to bed  Vitals:   12/10/21 0858  BP: (!) 170/90  Pulse: 60  Resp: 16  Temp: 97.7 F (36.5 C)  SpO2: 100%    Physical Exam Constitutional:      General: She is not in acute distress.    Appearance: Normal appearance. She is not toxic-appearing.     Comments: Examined in wheelchair  HENT:     Head: Normocephalic and atraumatic.  Eyes:     General: No scleral icterus. Cardiovascular:     Rate and Rhythm: Normal rate and regular rhythm.     Pulses: Normal pulses.     Heart sounds: Normal heart sounds.  Pulmonary:     Effort: Pulmonary effort is normal.     Breath sounds: Normal breath sounds.  Chest:     Comments: Difficulty in palpating right breast mass. Abdominal:     General: Abdomen is flat. Bowel sounds are normal. There is no distension.     Palpations: Abdomen is soft.     Tenderness: There is no abdominal tenderness.  Musculoskeletal:        General: No swelling.     Cervical back: Neck supple.  Lymphadenopathy:     Cervical: No cervical adenopathy.  Skin:    General: Skin is warm and dry.     Findings: No rash.  Neurological:     General: No focal deficit present.     Mental Status: She is alert.     Comments: Patient has word finding difficulty.  She is status post CVA and this is residual side effect and chronic.  Psychiatric:        Mood and Affect: Mood normal.        Behavior: Behavior normal.    LABORATORY DATA:  CBC    Component Value Date/Time   WBC 3.5 (L) 12/10/2021 0835   WBC 4.0 10/08/2020 1204   RBC 3.84 (L) 12/10/2021 0835   HGB 11.4 (L) 12/10/2021 0835   HGB 10.2 (L) 08/04/2020 0952   HGB 11.0 (L) 11/22/2011 1112   HCT 36.1 12/10/2021 0835   HCT 31.2 (L) 08/04/2020 0952   HCT 33.5 (L) 11/22/2011 1112   PLT 109 (L) 12/10/2021 0835   PLT 151 08/04/2020 0952   MCV 94.0 12/10/2021 0835   MCV 94 08/04/2020 0952   MCV 92.8  11/22/2011 1112   MCH 29.7 12/10/2021 0835   MCHC 31.6 12/10/2021 0835   RDW 13.7 12/10/2021 0835  RDW 13.8 08/04/2020 0952   RDW 12.8 11/22/2011 1112   LYMPHSABS 1.2 12/10/2021 0835   LYMPHSABS 1.2 01/15/2019 1303   LYMPHSABS 1.2 11/22/2011 1112   MONOABS 0.3 12/10/2021 0835   MONOABS 0.3 11/22/2011 1112   EOSABS 0.2 12/10/2021 0835   EOSABS 0.2 01/15/2019 1303   BASOSABS 0.0 12/10/2021 0835   BASOSABS 0.1 01/15/2019 1303   BASOSABS 0.0 11/22/2011 1112    CMP     Component Value Date/Time   NA 144 12/10/2021 0835   NA 142 08/04/2020 0952   K 4.1 12/10/2021 0835   CL 104 12/10/2021 0835   CO2 33 (H) 12/10/2021 0835   GLUCOSE 119 (H) 12/10/2021 0835   BUN 39 (H) 12/10/2021 0835   BUN 14 08/04/2020 0952   CREATININE 1.86 (H) 12/10/2021 0835   CREATININE 1.50 (H) 01/19/2016 1212   CALCIUM 9.1 12/10/2021 0835   PROT 7.4 12/10/2021 0835   PROT 7.4 04/02/2020 1244   ALBUMIN 3.9 12/10/2021 0835   ALBUMIN 4.1 04/02/2020 1244   AST 28 12/10/2021 0835   ALT 31 12/10/2021 0835   ALKPHOS 94 12/10/2021 0835   BILITOT 0.5 12/10/2021 0835   GFRNONAA 26 (L) 12/10/2021 0835   GFRAA 39 (L) 08/04/2020 0952   GFRAA 21 (L) 11/28/2018 1211      ASSESSMENT and THERAPY PLAN:   Malignant neoplasm of 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  Neoadjuvant antiestrogen therapy with anastrozole started 11/28/2018 2.    Mammogram and ultrasound every 6 months.    Mammogram and ultrasound 10/19/2021: Breast mass is stable.  44-monthfollow-up was recommended.  Anastrozole toxicities: Hair loss stable she otherwise is tolerating treatment  well.  I have placed orders for bone density testing since she has not undergone this as far as I can tell.  She will undergo this in June 2023 when I ordered her follow-up mammogram and ultrasound.  We will see her sometime in July or August for follow-up.  I placed this information on her rehab paper that she will bring back to her facility.    All questions were answered. The patient knows to call the clinic with any problems, questions or concerns. We can certainly see the patient much sooner if necessary.  Total encounter time: 20 minutes in face-to-face visit time, chart review, lab review, care coordination, order entry, and documentation of the encounter.  LWilber Bihari NP 12/10/21 9:33 AM Medical Oncology [facility] [id number]. [phone]   Fax. [phone] *Total Encounter Time as defined by the Centers for Medicare and Medicaid Services includes, in addition to the face-to-face time of a patient visit (documented in the note above) non-face-to-face time: obtaining and reviewing outside history, ordering and reviewing medications, tests or procedures, care coordination (communications with other health care professionals or caregivers) and documentation in the medical record.

## 2021-12-10 NOTE — Assessment & Plan Note (Signed)
11/19/2018:Palpable right breast mass with calcifications, mammogram revealed focal asymmetry lower right breast 5:00 measuring 4.6 cm, ultrasound revealed overall size of 5.7 cm ultrasound-guided biopsy revealed grade 2 IDC ER 90% PR 80% Ki-67 5%, HER-2 negative, T3N0 stage IIa clinical stage  Treatment plan: 1.Neoadjuvant antiestrogen therapy with anastrozolestarted 11/28/2018 2.    Mammogram and ultrasound every 6 months.   Mammogram and ultrasound 10/19/2021: Breast mass is stable.  28-monthfollow-up was recommended.  Anastrozole toxicities: Hair loss stable she otherwise is tolerating treatment well.  I have placed orders for bone density testing since she has not undergone this as far as I can tell.  She will undergo this in June 2023 when I ordered her follow-up mammogram and ultrasound.  We will see her sometime in July or August for follow-up.  I placed this information on her rehab paper that she will bring back to her facility.

## 2021-12-13 ENCOUNTER — Telehealth: Payer: Self-pay | Admitting: Adult Health

## 2021-12-13 NOTE — Telephone Encounter (Signed)
Scheduled appointment per 2/17 los. Patient is aware. Patient will be mailed an updated calendar.

## 2022-01-05 ENCOUNTER — Telehealth: Payer: Self-pay | Admitting: Interventional Cardiology

## 2022-01-05 NOTE — Telephone Encounter (Signed)
NP reaching out on behalf of the pts family stating that the family is wanting for the pt to take Tumeric.. NP advised that along with pts Eliquis this could cause bleeding... please advise  ?

## 2022-01-06 NOTE — Telephone Encounter (Signed)
Yes turmeric can increase bleeding risk which may be exacerbated since pt already takes Eliquis. Recommend stopping supplement if pt notices bruising/bleeding out of the ordinary. ?

## 2022-01-06 NOTE — Telephone Encounter (Signed)
I spoke with Tiffany and gave her information from pharmacist.  Patient and family will be advised not to start tumeric due to increased bleeding risk ?

## 2022-01-27 ENCOUNTER — Telehealth: Payer: Self-pay | Admitting: *Deleted

## 2022-01-27 ENCOUNTER — Other Ambulatory Visit: Payer: Self-pay | Admitting: *Deleted

## 2022-01-27 DIAGNOSIS — Z170 Estrogen receptor positive status [ER+]: Secondary | ICD-10-CM

## 2022-01-27 NOTE — Telephone Encounter (Signed)
Received call from Jonelle Sidle, NP with [facility] [phone]) stating pt recent CBC showed WBC 1.8, ANC 0.6 and Plt 85.  RN requested copy of recent lab work be faxed.  Per MD pt needing labs to be re checked and f/u office visit in 2 weeks.  Appt scheduled and Tiffany states she will arrange transportation for pt.  ?

## 2022-01-28 ENCOUNTER — Other Ambulatory Visit: Payer: Medicare Other

## 2022-02-02 ENCOUNTER — Telehealth: Payer: Self-pay | Admitting: Pharmacist

## 2022-02-02 LAB — CUP PACEART REMOTE DEVICE CHECK
Battery Remaining Percentage: 80 %
Brady Statistic RA Percent Paced: 0 %
Brady Statistic RV Percent Paced: 83 %
Date Time Interrogation Session: 20230411081725
Implantable Lead Implant Date: 20200624
Implantable Lead Implant Date: 20200624
Implantable Lead Location: 753859
Implantable Lead Location: 753860
Implantable Lead Model: 377
Implantable Lead Model: 377
Implantable Lead Serial Number: 81195510
Implantable Pulse Generator Implant Date: 20200624
Lead Channel Impedance Value: 390 Ohm
Lead Channel Impedance Value: 507 Ohm
Lead Channel Sensing Intrinsic Amplitude: 0.4 mV
Lead Channel Sensing Intrinsic Amplitude: 15 mV
Lead Channel Setting Pacing Amplitude: 1.8 V
Lead Channel Setting Pacing Amplitude: 3 V
Lead Channel Setting Pacing Pulse Width: 0.4 ms
Pulse Gen Model: 407145
Pulse Gen Serial Number: 69638782

## 2022-02-02 NOTE — Telephone Encounter (Signed)
Tiffany NP from [facility] called to report pt drop in plt and heme positive result today.   ?WBC 1.8, Plt 85 (down from 90 in Jan) RDW is normal, Hgb 10.1, HCT 30.7 (stable) ?Heme positive stools ?Iron increased, B12 yesterday ?Seeing Hematology next week ?Patient on Eliquis. Tiffany asking if ok to continue Eliquis.Pt w/ history of stroke ? ?Her work # is [phone] ? ?

## 2022-02-03 ENCOUNTER — Ambulatory Visit (INDEPENDENT_AMBULATORY_CARE_PROVIDER_SITE_OTHER): Payer: Medicare Other

## 2022-02-03 DIAGNOSIS — R55 Syncope and collapse: Secondary | ICD-10-CM

## 2022-02-07 ENCOUNTER — Telehealth: Payer: Self-pay | Admitting: Interventional Cardiology

## 2022-02-07 ENCOUNTER — Emergency Department (HOSPITAL_COMMUNITY): Payer: Medicare Other

## 2022-02-07 ENCOUNTER — Inpatient Hospital Stay (HOSPITAL_COMMUNITY)
Admission: EM | Admit: 2022-02-07 | Discharge: 2022-02-11 | DRG: 377 | Disposition: A | Discharge status: Skilled Nursing Facility | Payer: Medicare Other | Attending: Internal Medicine | Admitting: Internal Medicine

## 2022-02-07 ENCOUNTER — Inpatient Hospital Stay: Payer: Medicare Other | Attending: Hematology and Oncology

## 2022-02-07 ENCOUNTER — Inpatient Hospital Stay: Payer: Medicare Other | Admitting: Hematology and Oncology

## 2022-02-07 DIAGNOSIS — K921 Melena: Secondary | ICD-10-CM | POA: Diagnosis not present

## 2022-02-07 DIAGNOSIS — R059 Cough, unspecified: Secondary | ICD-10-CM | POA: Diagnosis not present

## 2022-02-07 DIAGNOSIS — Z20822 Contact with and (suspected) exposure to covid-19: Secondary | ICD-10-CM | POA: Diagnosis present

## 2022-02-07 DIAGNOSIS — I5042 Chronic combined systolic (congestive) and diastolic (congestive) heart failure: Secondary | ICD-10-CM | POA: Diagnosis present

## 2022-02-07 DIAGNOSIS — Z950 Presence of cardiac pacemaker: Secondary | ICD-10-CM

## 2022-02-07 DIAGNOSIS — Z8673 Personal history of transient ischemic attack (TIA), and cerebral infarction without residual deficits: Secondary | ICD-10-CM

## 2022-02-07 DIAGNOSIS — E119 Type 2 diabetes mellitus without complications: Secondary | ICD-10-CM

## 2022-02-07 DIAGNOSIS — K317 Polyp of stomach and duodenum: Secondary | ICD-10-CM | POA: Diagnosis present

## 2022-02-07 DIAGNOSIS — I495 Sick sinus syndrome: Secondary | ICD-10-CM | POA: Diagnosis present

## 2022-02-07 DIAGNOSIS — E1165 Type 2 diabetes mellitus with hyperglycemia: Secondary | ICD-10-CM | POA: Diagnosis present

## 2022-02-07 DIAGNOSIS — Z853 Personal history of malignant neoplasm of breast: Secondary | ICD-10-CM

## 2022-02-07 DIAGNOSIS — I10 Essential (primary) hypertension: Secondary | ICD-10-CM | POA: Diagnosis present

## 2022-02-07 DIAGNOSIS — Z86718 Personal history of other venous thrombosis and embolism: Secondary | ICD-10-CM

## 2022-02-07 DIAGNOSIS — C50311 Malignant neoplasm of lower-inner quadrant of right female breast: Secondary | ICD-10-CM

## 2022-02-07 DIAGNOSIS — I82409 Acute embolism and thrombosis of unspecified deep veins of unspecified lower extremity: Secondary | ICD-10-CM | POA: Diagnosis present

## 2022-02-07 DIAGNOSIS — J690 Pneumonitis due to inhalation of food and vomit: Secondary | ICD-10-CM | POA: Diagnosis present

## 2022-02-07 DIAGNOSIS — N183 Chronic kidney disease, stage 3 unspecified: Secondary | ICD-10-CM | POA: Diagnosis present

## 2022-02-07 DIAGNOSIS — E1122 Type 2 diabetes mellitus with diabetic chronic kidney disease: Secondary | ICD-10-CM | POA: Diagnosis present

## 2022-02-07 DIAGNOSIS — R079 Chest pain, unspecified: Secondary | ICD-10-CM

## 2022-02-07 DIAGNOSIS — D500 Iron deficiency anemia secondary to blood loss (chronic): Secondary | ICD-10-CM | POA: Diagnosis present

## 2022-02-07 DIAGNOSIS — I110 Hypertensive heart disease with heart failure: Secondary | ICD-10-CM | POA: Diagnosis present

## 2022-02-07 DIAGNOSIS — Z803 Family history of malignant neoplasm of breast: Secondary | ICD-10-CM

## 2022-02-07 DIAGNOSIS — Z170 Estrogen receptor positive status [ER+]: Secondary | ICD-10-CM

## 2022-02-07 DIAGNOSIS — Z7901 Long term (current) use of anticoagulants: Secondary | ICD-10-CM

## 2022-02-07 DIAGNOSIS — Z79811 Long term (current) use of aromatase inhibitors: Secondary | ICD-10-CM

## 2022-02-07 DIAGNOSIS — K219 Gastro-esophageal reflux disease without esophagitis: Secondary | ICD-10-CM | POA: Diagnosis present

## 2022-02-07 DIAGNOSIS — L89152 Pressure ulcer of sacral region, stage 2: Secondary | ICD-10-CM | POA: Diagnosis present

## 2022-02-07 DIAGNOSIS — I471 Supraventricular tachycardia, unspecified: Secondary | ICD-10-CM | POA: Diagnosis present

## 2022-02-07 DIAGNOSIS — E785 Hyperlipidemia, unspecified: Secondary | ICD-10-CM | POA: Diagnosis present

## 2022-02-07 DIAGNOSIS — Z888 Allergy status to other drugs, medicaments and biological substances status: Secondary | ICD-10-CM

## 2022-02-07 DIAGNOSIS — R058 Other specified cough: Secondary | ICD-10-CM

## 2022-02-07 DIAGNOSIS — Z7985 Long-term (current) use of injectable non-insulin antidiabetic drugs: Secondary | ICD-10-CM

## 2022-02-07 DIAGNOSIS — I130 Hypertensive heart and chronic kidney disease with heart failure and stage 1 through stage 4 chronic kidney disease, or unspecified chronic kidney disease: Secondary | ICD-10-CM | POA: Diagnosis present

## 2022-02-07 DIAGNOSIS — E1169 Type 2 diabetes mellitus with other specified complication: Secondary | ICD-10-CM

## 2022-02-07 DIAGNOSIS — N1832 Chronic kidney disease, stage 3b: Secondary | ICD-10-CM | POA: Diagnosis present

## 2022-02-07 DIAGNOSIS — Z8249 Family history of ischemic heart disease and other diseases of the circulatory system: Secondary | ICD-10-CM

## 2022-02-07 DIAGNOSIS — R911 Solitary pulmonary nodule: Secondary | ICD-10-CM | POA: Diagnosis present

## 2022-02-07 DIAGNOSIS — Z79899 Other long term (current) drug therapy: Secondary | ICD-10-CM

## 2022-02-07 DIAGNOSIS — L8990 Pressure ulcer of unspecified site, unspecified stage: Secondary | ICD-10-CM | POA: Insufficient documentation

## 2022-02-07 DIAGNOSIS — Z794 Long term (current) use of insulin: Secondary | ICD-10-CM

## 2022-02-07 DIAGNOSIS — Z823 Family history of stroke: Secondary | ICD-10-CM

## 2022-02-07 DIAGNOSIS — Z880 Allergy status to penicillin: Secondary | ICD-10-CM

## 2022-02-07 DIAGNOSIS — Z886 Allergy status to analgesic agent status: Secondary | ICD-10-CM

## 2022-02-07 DIAGNOSIS — R195 Other fecal abnormalities: Secondary | ICD-10-CM

## 2022-02-07 LAB — BASIC METABOLIC PANEL
Anion gap: 8 (ref 5–15)
BUN: 32 mg/dL — ABNORMAL HIGH (ref 8–23)
CO2: 27 mmol/L (ref 22–32)
Calcium: 8.6 mg/dL — ABNORMAL LOW (ref 8.9–10.3)
Chloride: 103 mmol/L (ref 98–111)
Creatinine, Ser: 1.63 mg/dL — ABNORMAL HIGH (ref 0.44–1.00)
GFR, Estimated: 31 mL/min — ABNORMAL LOW (ref >60)
Glucose, Bld: 285 mg/dL — ABNORMAL HIGH (ref 70–99)
Potassium: 4.1 mmol/L (ref 3.5–5.1)
Sodium: 138 mmol/L (ref 135–145)

## 2022-02-07 LAB — RESP PANEL BY RT-PCR (FLU A&B, COVID) ARPGX2
Influenza A by PCR: NEGATIVE
Influenza B by PCR: NEGATIVE
SARS Coronavirus 2 by RT PCR: NEGATIVE

## 2022-02-07 LAB — CBC WITH DIFFERENTIAL/PLATELET
Abs Immature Granulocytes: 0.05 10*3/uL (ref 0.00–0.07)
Basophils Absolute: 0 10*3/uL (ref 0.0–0.1)
Basophils Relative: 0 %
Eosinophils Absolute: 0.2 10*3/uL (ref 0.0–0.5)
Eosinophils Relative: 3 %
HCT: 33.4 % — ABNORMAL LOW (ref 36.0–46.0)
Hemoglobin: 10.8 g/dL — ABNORMAL LOW (ref 12.0–15.0)
Immature Granulocytes: 1 %
Lymphocytes Relative: 28 %
Lymphs Abs: 1.3 10*3/uL (ref 0.7–4.0)
MCH: 30.3 pg (ref 26.0–34.0)
MCHC: 32.3 g/dL (ref 30.0–36.0)
MCV: 93.6 fL (ref 80.0–100.0)
Monocytes Absolute: 0.4 10*3/uL (ref 0.1–1.0)
Monocytes Relative: 8 %
Neutro Abs: 2.9 10*3/uL (ref 1.7–7.7)
Neutrophils Relative %: 60 %
Platelets: 148 10*3/uL — ABNORMAL LOW (ref 150–400)
RBC: 3.57 MIL/uL — ABNORMAL LOW (ref 3.87–5.11)
RDW: 14 % (ref 11.5–15.5)
WBC: 4.8 10*3/uL (ref 4.0–10.5)
nRBC: 0 % (ref 0.0–0.2)

## 2022-02-07 LAB — TROPONIN I (HIGH SENSITIVITY)
Troponin I (High Sensitivity): 41 ng/L — ABNORMAL HIGH (ref <18)
Troponin I (High Sensitivity): 40 ng/L — ABNORMAL HIGH (ref <18)

## 2022-02-07 LAB — BRAIN NATRIURETIC PEPTIDE: B Natriuretic Peptide: 175.1 pg/mL — ABNORMAL HIGH (ref 0.0–100.0)

## 2022-02-07 LAB — PROTIME-INR
INR: 1.1 (ref 0.8–1.2)
Prothrombin Time: 13.7 seconds (ref 11.4–15.2)

## 2022-02-07 LAB — POC OCCULT BLOOD, ED: Fecal Occult Bld: NEGATIVE

## 2022-02-07 NOTE — ED Provider Notes (Signed)
?[facility] ?Provider Note ? ? ?CSN: [id number] ?Arrival date & time: 02/07/22  1739 ? ?  ? ?History ? ?Chief Complaint  ?Patient presents with  ? Weakness  ? ? ?[name] is a 88 y.o. female presenting from a nursing facility with concern for shortness of breath, dark and tarry stools.  The patient is on Eliquis.  She has had productive cough with yellow sputum for approximately 7 days.  She generally feels weak and low energy.  She does have a history of breast cancer for which she is actively being treated.  She also reports that she has had dark stools for about 3 to 4 days.  She is on Eliquis. ? ?She also has a history of DVT. ? ?Per her facility records, the patient has been treated with IM Rocephin as well as oral azithromycin for suspected pneumonia for the past several days.  Her daughter and granddaughter at the bedside reports that her cough is been getting worse, along with her generalized weakness. ? ?HPI ? ?  ? ?Home Medications ?Prior to Admission medications   ?Medication Sig Start Date End Date Taking? Authorizing Provider  ?amiodarone (PACERONE) 200 MG tablet Take one tablet by mouth daily Monday-Saturday. Do NOT take Sunday 05/06/20   Evans Lance, MD  ?anastrozole (ARIMIDEX) 1 MG tablet TAKE 1 TABLET(1 MG) BY MOUTH DAILY 09/07/21   Nicholas Lose, MD  ?atorvastatin (LIPITOR) 20 MG tablet TAKE 1 TABLET(20 MG) BY MOUTH AT BEDTIME 08/18/20   Minette Brine, FNP  ?diclofenac sodium (VOLTAREN) 1 % GEL Apply 2 g topically 4 (four) times daily. 10/30/18   Rodriguez-Southworth, Sunday Spillers, PA-C  ?Docusate Sodium 100 MG capsule Take 100 mg by mouth daily.    [provider]  ?Dulaglutide (TRULICITY) 0.81 KG/8.1EH SOPN See admin instructions.    [provider]  ?ELIQUIS 2.5 MG TABS tablet TAKE 1 TABLET(2.5 MG) BY MOUTH TWICE DAILY 07/13/20   Dunn, Nedra Hai, PA-C  ?esomeprazole (NEXIUM) 40 MG packet Take 40 mg by mouth daily before breakfast.    [provider]  ?ferrous sulfate 325 (65 FE) MG tablet Take 325 mg by mouth daily with breakfast.    [provider]  ?HUMALOG KWIKPEN 100 UNIT/ML KwikPen Inject 0-12 Units into the skin See admin instructions. Inject 0-12 units subcutaneously 3 times daily per sliding scale: BG 0-150 = 0 units BG 151-200 = 2 units BG 201-250 = 4 units BG 251-300 = 6 units BG 301-350 = 8 units BG 351-400 = 10 units BG 401+ = 12 units 08/18/20   [provider]  ?insulin glargine (LANTUS) 100 unit/mL SOPN Inject 15 Units into the skin at bedtime.    [provider]  ?lisinopril (ZESTRIL) 5 MG tablet Take 1 tablet (5 mg total) by mouth daily. 10/13/20   Charlynne Cousins, MD  ?Menthol, Topical Analgesic, (BIOFREEZE ROLL-ON) 4 % GEL Apply 1 application topically 3 (three) times daily.    [provider]  ?metoprolol succinate (TOPROL-XL) 25 MG 24 hr tablet Take 1 tablet (25 mg total) by mouth daily. 07/21/20   Aline August, MD  ?oxybutynin (DITROPAN) 5 MG tablet TAKE 1 TABLET(5 MG) BY MOUTH TWICE DAILY 12/23/19   [name], MD  ?potassium chloride (KLOR-CON) 10 MEQ tablet Take 4 tablets by mouth daily.    [provider]  ?pregabalin (LYRICA) 75 MG capsule Take 1 capsule (75 mg total) by mouth at bedtime. 06/30/20   Minette Brine, FNP  ?  torsemide (DEMADEX) 20 MG tablet Take 1 tablet (20 mg total) by mouth 2 (two) times daily. You may take an extra 20 mg tablet daily as needed for swelling 10/13/20   Charlynne Cousins, MD  ?   ? ?Allergies    ?Aspirin, Penicillins, and Remeron [mirtazapine]   ? ?Review of Systems   ?Review of Systems ? ?Physical Exam ?Updated Vital Signs ?BP 139/66   Pulse 60   Temp 97.6 ?F (36.4 ?C) (Oral)   Resp 14   SpO2 97%  ?Physical Exam ?Constitutional:   ?   General: She is not in acute distress. ?HENT:  ?   Head: Normocephalic and atraumatic.  ?Eyes:  ?   Conjunctiva/sclera: Conjunctivae normal.  ?   Pupils: Pupils are equal, round, and reactive to light.   ?Cardiovascular:  ?   Rate and Rhythm: Normal rate. Rhythm irregular.  ?Pulmonary:  ?   Effort: Pulmonary effort is normal. No respiratory distress.  ?   Comments: Productive cough ?Abdominal:  ?   General: There is no distension.  ?   Tenderness: There is no abdominal tenderness.  ?Skin: ?   General: Skin is warm and dry.  ?Neurological:  ?   General: No focal deficit present.  ?   Mental Status: She is alert and oriented to person, place, and time. Mental status is at baseline.  ?Psychiatric:     ?   Mood and Affect: Mood normal.     ?   Behavior: Behavior normal.  ? ? ?ED Results / Procedures / Treatments   ?Labs ?(all labs ordered are listed, but only abnormal results are displayed) ?Labs Reviewed  ?BASIC METABOLIC PANEL - Abnormal; Notable for the following components:  ?    Result Value  ? Glucose, Bld 285 (*)   ? BUN 32 (*)   ? Creatinine, Ser 1.63 (*)   ? Calcium 8.6 (*)   ? GFR, Estimated 31 (*)   ? All other components within normal limits  ?CBC WITH DIFFERENTIAL/PLATELET - Abnormal; Notable for the following components:  ? RBC 3.57 (*)   ? Hemoglobin 10.8 (*)   ? HCT 33.4 (*)   ? Platelets 148 (*)   ? All other components within normal limits  ?BRAIN NATRIURETIC PEPTIDE - Abnormal; Notable for the following components:  ? B Natriuretic Peptide 175.1 (*)   ? All other components within normal limits  ?TROPONIN I (HIGH SENSITIVITY) - Abnormal; Notable for the following components:  ? Troponin I (High Sensitivity) 41 (*)   ? All other components within normal limits  ?TROPONIN I (HIGH SENSITIVITY) - Abnormal; Notable for the following components:  ? Troponin I (High Sensitivity) 40 (*)   ? All other components within normal limits  ?RESP PANEL BY RT-PCR (FLU A&B, COVID) [id number]  ?PROTIME-INR  ?POC OCCULT BLOOD, ED  ?TROPONIN I (HIGH SENSITIVITY)  ? ? ?EKG ?EKG Interpretation ? ?Date/Time:  Monday February 07 2022 18:40:00 EDT ?Ventricular Rate:  60 ?PR Interval:    ?QRS Duration: 154 ?QT Interval:  518 ?QTC  Calculation: 518 ?R Axis:   -65 ?Text Interpretation: Aflutter with V-paced rhythm, similar to Dec 2021 ecg Confirmed by Octaviano Glow [phone]) on 02/07/2022 7:31:25 PM ? ?Radiology ?DG Chest 2 View ? ?Result Date: 02/07/2022 ?CLINICAL DATA:  Productive cough.  Evaluate for pneumonia. EXAM: CHEST - 2 VIEW COMPARISON:  Chest x-ray 10/08/2020 FINDINGS: Left chest wall dual lead pacemaker in similar position. The heart and mediastinal contours are unchanged. Aortic calcification.  Lingular scarring versus linear atelectasis. Right mid lung zone linear atelectasis versus scarring. No focal consolidation. No pulmonary edema. No pleural effusion. No pneumothorax. No acute osseous abnormality. Degenerative changes of the shoulders. IMPRESSION: 1. No active cardiopulmonary disease. 2.  Aortic Atherosclerosis (ICD10-I70.0). Electronically Signed   By: Iven Finn M.D.   On: 02/07/2022 19:42   ? ?Procedures ?Procedures  ? ? ?Medications Ordered in ED ?Medications - No data to display ? ?ED Course/ Medical Decision Making/ A&P ?Clinical Course as of 02/07/22 2318  ?Mon Feb 07, 2022  ?2110 Hemoccult negative, hgb near baseline level, no focal PNA or leukocytosis.  Covid/flu are negative. [MT]  ?2110 BNP significantly improved over prior levels. [MT]  ?2303 Admitted to hospitalist.  I've ordered a CT scan of the chest to evaluate for possible underlying PNA - will refrain from further antibiotics until scan is complete [MT]  ?  ?Clinical Course User Index ?[MT] Wyvonnia Dusky, MD  ? ?                        ?Medical Decision Making ?Amount and/or Complexity of Data Reviewed ?Labs: ordered. ?Radiology: ordered. ? ?Risk ?Decision regarding hospitalization. ? ? ?This patient presents to the ED with concern for productive cough, worsening fatigue, dark stools.. This involves an extensive number of treatment options, and is a complaint that carries with it a high risk of complications and morbidity.  The differential diagnosis  includes pneumonia versus congestive heart failure versus GI bleed versus anemia versus other ? ?Co-morbidities that complicate the patient evaluation: Patient is on blood thinners, Eliquis, high risk of ble

## 2022-02-07 NOTE — Telephone Encounter (Signed)
Patient's daughter called to let Dr. Irish Lack know that her mother is on the way to [facility] emergency room.  ?

## 2022-02-07 NOTE — Telephone Encounter (Signed)
Stephanie Booze, MD  You; Ramond Dial, RPH-CPP; Nicholas Lose, MD Yesterday (1:23 AM)  ? ?If Hbg remains stable, would try to continue Eliquis.  High risk of stroke off of anticoagulation given age, gender and prior stroke   ? ? ?I spoke with Tiffany and gave her message from Dr Irish Lack ?

## 2022-02-07 NOTE — ED Notes (Signed)
Pure wick placed.

## 2022-02-07 NOTE — Assessment & Plan Note (Deleted)
Received call from [place], NP with [facility] [phone]) stating pt recent CBC showed WBC 1.8, ANC 0.6 and Plt 85. ? ?12/10/2021: WBC 3.5, hemoglobin 11.4, platelets 109, ANC 1.7 ?02/07/2022: ?

## 2022-02-07 NOTE — Assessment & Plan Note (Deleted)
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 ?Treatment plan: ?1.??Neoadjuvant antiestrogen therapy with anastrozole?started 11/28/2018 ?2.??  Mammogram and ultrasound every 6 months.? ?? ?Mammogram and ultrasound 10/19/2021: Breast mass is stable.  86-monthfollow-up was recommended. ?? ?Anastrozole toxicities: Hair loss stable she otherwise is tolerating treatment well. ?

## 2022-02-07 NOTE — ED Triage Notes (Signed)
PT BIB [facility] for increased weakness, SOB w/ productive cough x 6 days.  PT also endorses black/tarry stools.  Pt has hx of breast cancer which EMS states she is currently being tx for.  EMS states pt has intermittent nausea.  EMS endorses bilateral rales on auscultation.  States pt is afebrile. ?  ?EMS vitsls 146/86, 98% RA, HR 60, CBG 349 ?

## 2022-02-07 NOTE — ED Notes (Signed)
Patient transported to X-ray 

## 2022-02-08 ENCOUNTER — Observation Stay (HOSPITAL_COMMUNITY): Payer: Medicare Other

## 2022-02-08 ENCOUNTER — Other Ambulatory Visit: Payer: Self-pay

## 2022-02-08 DIAGNOSIS — Z170 Estrogen receptor positive status [ER+]: Secondary | ICD-10-CM

## 2022-02-08 DIAGNOSIS — C50311 Malignant neoplasm of lower-inner quadrant of right female breast: Secondary | ICD-10-CM

## 2022-02-08 DIAGNOSIS — R079 Chest pain, unspecified: Secondary | ICD-10-CM

## 2022-02-08 DIAGNOSIS — Z888 Allergy status to other drugs, medicaments and biological substances status: Secondary | ICD-10-CM | POA: Diagnosis not present

## 2022-02-08 DIAGNOSIS — Z8249 Family history of ischemic heart disease and other diseases of the circulatory system: Secondary | ICD-10-CM | POA: Diagnosis not present

## 2022-02-08 DIAGNOSIS — Z803 Family history of malignant neoplasm of breast: Secondary | ICD-10-CM | POA: Diagnosis not present

## 2022-02-08 DIAGNOSIS — E1122 Type 2 diabetes mellitus with diabetic chronic kidney disease: Secondary | ICD-10-CM | POA: Diagnosis present

## 2022-02-08 DIAGNOSIS — Z950 Presence of cardiac pacemaker: Secondary | ICD-10-CM | POA: Diagnosis not present

## 2022-02-08 DIAGNOSIS — Z880 Allergy status to penicillin: Secondary | ICD-10-CM | POA: Diagnosis not present

## 2022-02-08 DIAGNOSIS — E785 Hyperlipidemia, unspecified: Secondary | ICD-10-CM | POA: Diagnosis present

## 2022-02-08 DIAGNOSIS — N1831 Chronic kidney disease, stage 3a: Secondary | ICD-10-CM | POA: Diagnosis not present

## 2022-02-08 DIAGNOSIS — Z794 Long term (current) use of insulin: Secondary | ICD-10-CM

## 2022-02-08 DIAGNOSIS — J690 Pneumonitis due to inhalation of food and vomit: Secondary | ICD-10-CM

## 2022-02-08 DIAGNOSIS — D500 Iron deficiency anemia secondary to blood loss (chronic): Secondary | ICD-10-CM | POA: Diagnosis present

## 2022-02-08 DIAGNOSIS — K921 Melena: Secondary | ICD-10-CM

## 2022-02-08 DIAGNOSIS — R195 Other fecal abnormalities: Secondary | ICD-10-CM | POA: Diagnosis not present

## 2022-02-08 DIAGNOSIS — Z20822 Contact with and (suspected) exposure to covid-19: Secondary | ICD-10-CM | POA: Diagnosis present

## 2022-02-08 DIAGNOSIS — R059 Cough, unspecified: Secondary | ICD-10-CM | POA: Diagnosis present

## 2022-02-08 DIAGNOSIS — N1832 Chronic kidney disease, stage 3b: Secondary | ICD-10-CM | POA: Diagnosis present

## 2022-02-08 DIAGNOSIS — Z886 Allergy status to analgesic agent status: Secondary | ICD-10-CM | POA: Diagnosis not present

## 2022-02-08 DIAGNOSIS — L89152 Pressure ulcer of sacral region, stage 2: Secondary | ICD-10-CM | POA: Diagnosis present

## 2022-02-08 DIAGNOSIS — I471 Supraventricular tachycardia: Secondary | ICD-10-CM | POA: Diagnosis present

## 2022-02-08 DIAGNOSIS — I824Z1 Acute embolism and thrombosis of unspecified deep veins of right distal lower extremity: Secondary | ICD-10-CM | POA: Diagnosis not present

## 2022-02-08 DIAGNOSIS — N184 Chronic kidney disease, stage 4 (severe): Secondary | ICD-10-CM

## 2022-02-08 DIAGNOSIS — K317 Polyp of stomach and duodenum: Secondary | ICD-10-CM | POA: Diagnosis present

## 2022-02-08 DIAGNOSIS — Z823 Family history of stroke: Secondary | ICD-10-CM | POA: Diagnosis not present

## 2022-02-08 DIAGNOSIS — I5042 Chronic combined systolic (congestive) and diastolic (congestive) heart failure: Secondary | ICD-10-CM | POA: Diagnosis present

## 2022-02-08 DIAGNOSIS — I10 Essential (primary) hypertension: Secondary | ICD-10-CM

## 2022-02-08 DIAGNOSIS — Z853 Personal history of malignant neoplasm of breast: Secondary | ICD-10-CM | POA: Diagnosis not present

## 2022-02-08 DIAGNOSIS — R911 Solitary pulmonary nodule: Secondary | ICD-10-CM | POA: Diagnosis present

## 2022-02-08 DIAGNOSIS — I495 Sick sinus syndrome: Secondary | ICD-10-CM | POA: Diagnosis present

## 2022-02-08 DIAGNOSIS — I130 Hypertensive heart and chronic kidney disease with heart failure and stage 1 through stage 4 chronic kidney disease, or unspecified chronic kidney disease: Secondary | ICD-10-CM | POA: Diagnosis present

## 2022-02-08 DIAGNOSIS — Z8673 Personal history of transient ischemic attack (TIA), and cerebral infarction without residual deficits: Secondary | ICD-10-CM | POA: Diagnosis not present

## 2022-02-08 DIAGNOSIS — Z86718 Personal history of other venous thrombosis and embolism: Secondary | ICD-10-CM | POA: Diagnosis not present

## 2022-02-08 LAB — BASIC METABOLIC PANEL
Anion gap: 10 (ref 5–15)
BUN: 25 mg/dL — ABNORMAL HIGH (ref 8–23)
CO2: 28 mmol/L (ref 22–32)
Calcium: 8.5 mg/dL — ABNORMAL LOW (ref 8.9–10.3)
Chloride: 101 mmol/L (ref 98–111)
Creatinine, Ser: 1.57 mg/dL — ABNORMAL HIGH (ref 0.44–1.00)
GFR, Estimated: 32 mL/min — ABNORMAL LOW (ref >60)
Glucose, Bld: 217 mg/dL — ABNORMAL HIGH (ref 70–99)
Potassium: 3.9 mmol/L (ref 3.5–5.1)
Sodium: 139 mmol/L (ref 135–145)

## 2022-02-08 LAB — CBC
HCT: 33.3 % — ABNORMAL LOW (ref 36.0–46.0)
HCT: 31.9 % — ABNORMAL LOW (ref 36.0–46.0)
Hemoglobin: 10.7 g/dL — ABNORMAL LOW (ref 12.0–15.0)
Hemoglobin: 10.4 g/dL — ABNORMAL LOW (ref 12.0–15.0)
MCH: 29.8 pg (ref 26.0–34.0)
MCH: 30.7 pg (ref 26.0–34.0)
MCHC: 32.1 g/dL (ref 30.0–36.0)
MCHC: 32.6 g/dL (ref 30.0–36.0)
MCV: 92.8 fL (ref 80.0–100.0)
MCV: 94.1 fL (ref 80.0–100.0)
Platelets: 147 10*3/uL — ABNORMAL LOW (ref 150–400)
Platelets: 137 10*3/uL — ABNORMAL LOW (ref 150–400)
RBC: 3.59 MIL/uL — ABNORMAL LOW (ref 3.87–5.11)
RBC: 3.39 MIL/uL — ABNORMAL LOW (ref 3.87–5.11)
RDW: 14.1 % (ref 11.5–15.5)
RDW: 14 % (ref 11.5–15.5)
WBC: 5 10*3/uL (ref 4.0–10.5)
WBC: 4 10*3/uL (ref 4.0–10.5)
nRBC: 0 % (ref 0.0–0.2)
nRBC: 0 % (ref 0.0–0.2)

## 2022-02-08 LAB — CBG MONITORING, ED
Glucose-Capillary: 204 mg/dL — ABNORMAL HIGH (ref 70–99)
Glucose-Capillary: 196 mg/dL — ABNORMAL HIGH (ref 70–99)
Glucose-Capillary: 243 mg/dL — ABNORMAL HIGH (ref 70–99)

## 2022-02-08 LAB — PROCALCITONIN: Procalcitonin: <0.1 ng/mL

## 2022-02-08 LAB — TROPONIN I (HIGH SENSITIVITY): Troponin I (High Sensitivity): 43 ng/L — ABNORMAL HIGH (ref <18)

## 2022-02-08 LAB — HEMOGLOBIN A1C
Hgb A1c MFr Bld: 10.2 % — ABNORMAL HIGH (ref 4.8–5.6)
Mean Plasma Glucose: 246.04 mg/dL

## 2022-02-08 LAB — C-REACTIVE PROTEIN: CRP: 0.9 mg/dL (ref <1.0)

## 2022-02-08 LAB — GLUCOSE, CAPILLARY: Glucose-Capillary: 290 mg/dL — ABNORMAL HIGH (ref 70–99)

## 2022-02-08 MED ORDER — ATORVASTATIN CALCIUM 10 MG PO TABS: 10.0000 mg | ORAL_TABLET | Freq: Every day | ORAL | Status: DC

## 2022-02-08 MED ORDER — GUAIFENESIN ER 600 MG PO TB12
600.0000 mg | ORAL_TABLET | Freq: Two times a day (BID) | ORAL | Status: DC
  Administered 2022-02-08 – 2022-02-11 (×7): 600 mg via ORAL
  Filled 2022-02-08 (×8): qty 1

## 2022-02-08 MED ORDER — VITAMIN B-12 1000 MCG PO TABS
500.0000 ug | ORAL_TABLET | Freq: Every morning | ORAL | Status: DC
  Administered 2022-02-08 – 2022-02-11 (×4): 500 ug via ORAL
  Filled 2022-02-08 (×5): qty 1

## 2022-02-08 MED ORDER — AMIODARONE HCL 200 MG PO TABS
200.0000 mg | ORAL_TABLET | ORAL | Status: DC
  Administered 2022-02-09 – 2022-02-11 (×3): 200 mg via ORAL
  Filled 2022-02-08 (×3): qty 1

## 2022-02-08 MED ORDER — ACETAMINOPHEN 325 MG PO TABS
650.0000 mg | ORAL_TABLET | Freq: Four times a day (QID) | ORAL | Status: DC | PRN
  Administered 2022-02-11: 650 mg via ORAL
  Filled 2022-02-08: qty 2

## 2022-02-08 MED ORDER — METOPROLOL SUCCINATE ER 25 MG PO TB24
25.0000 mg | ORAL_TABLET | Freq: Every day | ORAL | Status: DC
  Administered 2022-02-08 – 2022-02-11 (×4): 25 mg via ORAL
  Filled 2022-02-08 (×4): qty 1

## 2022-02-08 MED ORDER — OMEPRAZOLE 20 MG PO CPDR
20.0000 mg | DELAYED_RELEASE_CAPSULE | Freq: Every morning | ORAL | Status: DC
  Filled 2022-02-08: qty 1

## 2022-02-08 MED ORDER — PANTOPRAZOLE SODIUM 40 MG IV SOLR
40.0000 mg | Freq: Two times a day (BID) | INTRAVENOUS | Status: DC
  Administered 2022-02-08 – 2022-02-11 (×7): 40 mg via INTRAVENOUS
  Filled 2022-02-08 (×7): qty 10

## 2022-02-08 MED ORDER — AMIODARONE HCL 200 MG PO TABS
200.0000 mg | ORAL_TABLET | Freq: Every day | ORAL | Status: DC
  Administered 2022-02-08: 200 mg via ORAL
  Filled 2022-02-08: qty 1

## 2022-02-08 MED ORDER — ATORVASTATIN CALCIUM 10 MG PO TABS
20.0000 mg | ORAL_TABLET | Freq: Every day | ORAL | Status: DC
  Administered 2022-02-08: 20 mg via ORAL
  Filled 2022-02-08: qty 2

## 2022-02-08 MED ORDER — SODIUM CHLORIDE 0.9 % IV SOLN
3.0000 g | Freq: Two times a day (BID) | INTRAVENOUS | Status: DC
  Administered 2022-02-08 – 2022-02-11 (×7): 3 g via INTRAVENOUS
  Filled 2022-02-08 (×9): qty 8

## 2022-02-08 MED ORDER — INSULIN GLARGINE-YFGN 100 UNIT/ML ~~LOC~~ SOLN
8.0000 [IU] | Freq: Every day | SUBCUTANEOUS | Status: DC
  Administered 2022-02-08 – 2022-02-10 (×3): 8 [IU] via SUBCUTANEOUS
  Filled 2022-02-08 (×4): qty 0.08

## 2022-02-08 MED ORDER — INSULIN GLARGINE 100 UNITS/ML SOLOSTAR PEN: 8.0000 [IU] | PEN_INJECTOR | Freq: Every day | SUBCUTANEOUS | Status: DC

## 2022-02-08 MED ORDER — FOLIC ACID 1 MG PO TABS
0.5000 mg | ORAL_TABLET | Freq: Every morning | ORAL | Status: DC
  Administered 2022-02-08 – 2022-02-11 (×4): 0.5 mg via ORAL
  Filled 2022-02-08 (×4): qty 1

## 2022-02-08 MED ORDER — LEVOFLOXACIN IN D5W 750 MG/150ML IV SOLN
750.0000 mg | Freq: Once | INTRAVENOUS | Status: DC
  Filled 2022-02-08: qty 150

## 2022-02-08 MED ORDER — ACETAMINOPHEN 650 MG RE SUPP: 650.0000 mg | Freq: Four times a day (QID) | RECTAL | Status: DC | PRN

## 2022-02-08 MED ORDER — LEVOFLOXACIN IN D5W 750 MG/150ML IV SOLN: 750.0000 mg | INTRAVENOUS | Status: DC

## 2022-02-08 MED ORDER — INSULIN ASPART 100 UNIT/ML IJ SOLN: 0.0000 [IU] | Freq: Four times a day (QID) | INTRAMUSCULAR | Status: DC

## 2022-02-08 MED ORDER — LACTATED RINGERS IV SOLN: INTRAVENOUS | Status: AC

## 2022-02-08 MED ORDER — INSULIN ASPART 100 UNIT/ML IJ SOLN
0.0000 [IU] | INTRAMUSCULAR | Status: DC
  Administered 2022-02-08: 5 [IU] via SUBCUTANEOUS
  Administered 2022-02-08: 2 [IU] via SUBCUTANEOUS
  Administered 2022-02-08: 3 [IU] via SUBCUTANEOUS
  Administered 2022-02-09: 2 [IU] via SUBCUTANEOUS
  Administered 2022-02-09: 0 [IU] via SUBCUTANEOUS
  Administered 2022-02-09: 2 [IU] via SUBCUTANEOUS

## 2022-02-08 MED ORDER — ATORVASTATIN CALCIUM 10 MG PO TABS
10.0000 mg | ORAL_TABLET | Freq: Every day | ORAL | Status: DC
  Administered 2022-02-08 – 2022-02-10 (×3): 10 mg via ORAL
  Filled 2022-02-08 (×3): qty 1

## 2022-02-08 MED ORDER — GUAIFENESIN-DM 100-10 MG/5ML PO SYRP
5.0000 mL | ORAL_SOLUTION | ORAL | Status: DC | PRN
Start: 2022-02-08 — End: 2022-02-11
  Administered 2022-02-08: 5 mL via ORAL
  Filled 2022-02-08: qty 5

## 2022-02-08 NOTE — Progress Notes (Addendum)
Pharmacy Antibiotic Note ? [name] is a 88 y.o. female admitted on 02/07/2022 with pneumonia.  Pharmacy has been consulted for Unasyn dosing.  Of note pt has PCN allergy listed (first documented in [facility] in 2010) but she tolerated a full course of Unasyn in 2021. ? ?Plan: ?Unasyn 3g IV Q12H. ? ?Height: '5\' 5"'$  (165.1 cm) ?Weight: 86 kg (189 lb 9.5 oz) ?IBW/kg (Calculated) : 57 ? ?Temp (24hrs), Avg:97.6 ?F (36.4 ?C), Min:97.6 ?F (36.4 ?C), Max:97.6 ?F (36.4 ?C) ? ?Recent Labs  ?Lab 02/07/22 ?1913  ?WBC 4.8  ?CREATININE 1.63*  ?  ?Estimated Creatinine Clearance: 26.8 mL/min (A) (by C-G formula based on SCr of 1.63 mg/dL (H)).   ? ?Allergies  ?Allergen Reactions  ? Aspirin Itching  ? Penicillins Itching and Rash  ?  DID THE REACTION INVOLVE: Swelling of the face/tongue/throat, SOB, or low BP? Yes ?Sudden or severe rash/hives, skin peeling, or the inside of the mouth or nose? No ?Did it require medical treatment? Yes ?When did it last happen? "More than 10 years ago" ?If all above answers are "NO", may proceed with cephalosporin use. ?  ? Remeron [Mirtazapine] Other (See Comments)  ?  Unknown, identified this on list from SNF  ? ? ? ?Thank you for allowing pharmacy to be a part of this patient?s care. ? ?[name], PharmD, BCPS  ?02/08/2022 4:38 AM ? ?

## 2022-02-08 NOTE — Progress Notes (Signed)
Pt arrives to floor around 1745 via stretcher by ED nurse in no acute distress. Pt transferred over from stretcher to bed with 2 assist. VSS.  ?Respirations even and unlabored on room air. Assessment completed. Bed in low position. Call bell and room phone within reach. Pt encouraged to use call bell for assistance. Pt oriented to room.  ?

## 2022-02-08 NOTE — Telephone Encounter (Signed)
Pt's daughter called to notify Dr. Irish Lack that pt is in the ER and being admitted for Pneumonia. Please advise ?

## 2022-02-08 NOTE — Telephone Encounter (Signed)
I spoke with patient's daughter and told her I would let Dr Irish Lack know.  Daughter aware cardiology will be consulted if hospital doctor feels cardiology needed.  ?

## 2022-02-08 NOTE — H&P (Signed)
?History and Physical  ? ? ?[name] OEU:[id number] DOB: May 01, 1933 DOA: 02/07/2022 ? ?PCP: Pcp, No ? ?Patient coming from: SNF ? ?Chief Complaint: Cough, shortness of breath, dark stools ? ?HPI: [name] is a 88 y.o. female with medical history significant of chronic combined CHF, CKD stage IIIb, CVA, insulin-dependent type 2 diabetes, recurrent DVT on Eliquis, hypertension, SVT/atrial tachycardia status post PPM, breast cancer on antiestrogen therapy presented to the ED with complaints of cough, shortness of breath, dark tarry stools, and fatigue.  Facility reported that FOBT was positive earlier this week.  In the ED, not enough stool appreciated on rectal exam.  FOBT negative.  WBC 4.8, hemoglobin 10.8 with no significant change from baseline), platelet count 148k (improved compared to prior labs).  Sodium 138, potassium 4.1, chloride 103, bicarb 27, BUN 32, creatinine 1.6 (stable), glucose 285.  High-sensitivity troponin 41 >40 >43.  BNP 175.  COVID and influenza PCR negative.  CT chest showing patchy airspace and groundglass opacities in the bilateral lower lobes and left upper lobe with additional tree-in-bud opacities in the left upper lobe, findings compatible with infectious/inflammatory process.  Showing a 8 mm solid pulmonary nodule within the right upper lobe, radiologist recommending noncontrast chest CT in 6 to 12 months. ? ?Patient states her roommate was sick at the nursing home.  For the past few days she is coughing up sputum and feeling short of breath.  Also reports slight central chest discomfort from coughing and is requesting Tylenol.  Denies fevers or chills.  States last week she was told at her nursing home that her stool was black in color.  She denies abdominal pain or hematemesis.  Does mention that she had an endoscopy done in [place] 2 years ago ("they took a camera down my throat") as she was having difficulty swallowing and was told that she had "polyps." ? ?Review of  Systems:  ?Review of Systems  ?All other systems reviewed and are negative. ? ?Past Medical History:  ?Diagnosis Date  ? Abnormal echocardiogram   ? a. possible mass on echo 05/2019 on PPM, b. no evidence of mass / atrial lead vegetation on follow up echo 06/2019  ? Anemia   ? Arthritis   ? Breast cancer ([place]) 10/2018  ? Invasive ductal carcinoma  ? Cancer [facility])   ? Cataract   ? Chronic combined systolic and diastolic CHF (congestive heart failure) ([place])   ? a. Previously diastolic but patient AVS from outside hospital listed systolic CHF and cardiomyopathy, records pending.  ? CKD (chronic kidney disease), stage III ([place])   ? Clotting disorder ([place])   ? CVA (cerebral vascular accident) [facility])   ? residual right sided weakness and mild dysphagia  ? Diabetes mellitus ([place])   ? Dizziness and giddiness   ? DVT (deep venous thrombosis) ([place])   ? a. on anticoagulation for this.  ? Essential hypertension   ? LBBB (left bundle branch block)   ? Mitral regurgitation   ? a. Mod MR by echo 2014.  ? Muscular deconditioning   ? Obesity   ? Pacemaker   ? Pericardial effusion   ? SVT (supraventricular tachycardia) ([place])   ? a. In 2013 she had an EPS with ablation for SVT which did not eliminate the SVT completely. She also had bradycardia which limited medication. She was placed on amiodarone by Dr. Lovena Le.  ? Thrombocytopenia ([place]) 11/21/2011  ? ? ?Past Surgical History:  ?Procedure Laterality Date  ? BREAST BIOPSY Right  11/19/2018  ? positive  ? EYE SURGERY    ? SUPRAVENTRICULAR TACHYCARDIA ABLATION N/A 10/11/2012  ? Procedure: SUPRAVENTRICULAR TACHYCARDIA ABLATION;  Surgeon: Evans Lance, MD;  Location: [facility] CATH LAB;  Service: Cardiovascular;  Laterality: N/A;  ? ? ? reports that she has never smoked. She has never used smokeless tobacco. She reports that she does not drink alcohol and does not use drugs. ? ?Allergies  ?Allergen Reactions  ? Aspirin Itching  ? Penicillins Itching and Rash  ?  DID THE REACTION INVOLVE: Swelling of  the face/tongue/throat, SOB, or low BP? Yes ?Sudden or severe rash/hives, skin peeling, or the inside of the mouth or nose? No ?Did it require medical treatment? Yes ?When did it last happen? "More than 10 years ago" ?If all above answers are "NO", may proceed with cephalosporin use. ?  ? Remeron [Mirtazapine] Other (See Comments)  ?  Unknown, identified this on list from SNF  ? ? ?Family History  ?Problem Relation Age of Onset  ? Stroke Mother   ? Cancer Father   ?     prostate  ? Hypertension Daughter   ? Heart attack Brother   ?     x2  ? Hypertension Brother   ? Stroke Sister   ? Hypertension Sister   ? Breast cancer Maternal Aunt   ? ? ?Prior to Admission medications   ?Medication Sig Start Date End Date Taking? Authorizing Provider  ?amiodarone (PACERONE) 200 MG tablet Take one tablet by mouth daily Monday-Saturday. Do NOT take Sunday 05/06/20   Evans Lance, MD  ?anastrozole (ARIMIDEX) 1 MG tablet TAKE 1 TABLET(1 MG) BY MOUTH DAILY 09/07/21   Nicholas Lose, MD  ?atorvastatin (LIPITOR) 20 MG tablet TAKE 1 TABLET(20 MG) BY MOUTH AT BEDTIME 08/18/20   Minette Brine, FNP  ?diclofenac sodium (VOLTAREN) 1 % GEL Apply 2 g topically 4 (four) times daily. 10/30/18   Rodriguez-Southworth, Sunday Spillers, PA-C  ?Docusate Sodium 100 MG capsule Take 100 mg by mouth daily.    [provider]  ?Dulaglutide (TRULICITY) 5.78 IO/9.6EX SOPN See admin instructions.    [provider]  ?ELIQUIS 2.5 MG TABS tablet TAKE 1 TABLET(2.5 MG) BY MOUTH TWICE DAILY 07/13/20   Dunn, Nedra Hai, PA-C  ?esomeprazole (NEXIUM) 40 MG packet Take 40 mg by mouth daily before breakfast.    [provider]  ?ferrous sulfate 325 (65 FE) MG tablet Take 325 mg by mouth daily with breakfast.    [provider]  ?HUMALOG KWIKPEN 100 UNIT/ML KwikPen Inject 0-12 Units into the skin See admin instructions. Inject 0-12 units subcutaneously 3 times daily per sliding scale: BG 0-150 = 0 units BG 151-200 = 2 units BG 201-250 = 4 units BG  251-300 = 6 units BG 301-350 = 8 units BG 351-400 = 10 units BG 401+ = 12 units 08/18/20   [provider]  ?insulin glargine (LANTUS) 100 unit/mL SOPN Inject 15 Units into the skin at bedtime.    [provider]  ?lisinopril (ZESTRIL) 5 MG tablet Take 1 tablet (5 mg total) by mouth daily. 10/13/20   Charlynne Cousins, MD  ?Menthol, Topical Analgesic, (BIOFREEZE ROLL-ON) 4 % GEL Apply 1 application topically 3 (three) times daily.    [provider]  ?metoprolol succinate (TOPROL-XL) 25 MG 24 hr tablet Take 1 tablet (25 mg total) by mouth daily. 07/21/20   Aline August, MD  ?oxybutynin (DITROPAN) 5 MG tablet TAKE 1 TABLET(5 MG) BY MOUTH TWICE DAILY 12/23/19  [name], MD  ?potassium chloride (KLOR-CON) 10 MEQ tablet Take 4 tablets by mouth daily.    [provider]  ?pregabalin (LYRICA) 75 MG capsule Take 1 capsule (75 mg total) by mouth at bedtime. 06/30/20   Minette Brine, FNP  ?torsemide (DEMADEX) 20 MG tablet Take 1 tablet (20 mg total) by mouth 2 (two) times daily. You may take an extra 20 mg tablet daily as needed for swelling 10/13/20   Charlynne Cousins, MD  ? ? ?Physical Exam: ?Vitals:  ? 02/07/22 2330 02/08/22 0115 02/08/22 0245 02/08/22 0300  ?BP: 130/75 (!) 125/104 123/68 118/72  ?Pulse: 60 61 60 (!) 59  ?Resp: '19 19 13 19  '$ ?Temp:      ?TempSrc:      ?SpO2: 97% 98% 100% 97%  ? ? ?Physical Exam ?Vitals reviewed.  ?Constitutional:   ?   General: She is not in acute distress. ?HENT:  ?   Head: Normocephalic and atraumatic.  ?Eyes:  ?   Extraocular Movements: Extraocular movements intact.  ?   Conjunctiva/sclera: Conjunctivae normal.  ?Cardiovascular:  ?   Rate and Rhythm: Normal rate and regular rhythm.  ?   Pulses: Normal pulses.  ?Pulmonary:  ?   Effort: Pulmonary effort is normal. No respiratory distress.  ?   Breath sounds: No wheezing.  ?Abdominal:  ?   General: Bowel sounds are normal. There is no distension.  ?   Palpations: Abdomen is soft.  ?    Tenderness: There is no abdominal tenderness.  ?Musculoskeletal:     ?   General: No swelling or tenderness.  ?   Cervical back: Normal range of motion.  ?Skin: ?   General: Skin is warm and dry.  ?Neurological:

## 2022-02-08 NOTE — H&P (View-Only) (Signed)
Reason for Consult: [place] ?Referring Physician: [facility] ? ?[name] ?HPI:  This is an 88 year old female with a PMH of CHF with an EF of 30%, CKD, DM, recurrent DVT on Eliquis, HTN, SVT s/p PPM, and CVA admitted for a pneumonia.  She also reported having some issues with dark tarry stools and complaints of GERD.  The melenic stools started last Thursday.  At her nursing facility she was found to be heme positive, but the repeat hemoccult was negative.  However, the report was that there was not enough stool obtained for adequate testing.  Her HGB on admission was at 10.4 g/dL, which is her baseline.  An EUS was performed in 2012 to evaluated her dilated CBD, but no abnormalities were noted outside of a mildly dilated CBD at 9 mm.  A CT scan of the chest is positive for ground glass opacities in the bilateral lung fields as well as the LUL. ? ?Past Medical History:  ?Diagnosis Date  ? Abnormal echocardiogram   ? a. possible mass on echo 05/2019 on PPM, b. no evidence of mass / atrial lead vegetation on follow up echo 06/2019  ? Anemia   ? Arthritis   ? Breast cancer ([place]) 10/2018  ? Invasive ductal carcinoma  ? Cancer [facility])   ? Cataract   ? Chronic combined systolic and diastolic CHF (congestive heart failure) ([place])   ? a. Previously diastolic but patient AVS from outside hospital listed systolic CHF and cardiomyopathy, records pending.  ? CKD (chronic kidney disease), stage III ([place])   ? Clotting disorder ([place])   ? CVA (cerebral vascular accident) [facility])   ? residual right sided weakness and mild dysphagia  ? Diabetes mellitus ([place])   ? Dizziness and giddiness   ? DVT (deep venous thrombosis) ([place])   ? a. on anticoagulation for this.  ? Essential hypertension   ? LBBB (left bundle branch block)   ? Mitral regurgitation   ? a. Mod MR by echo 2014.  ? Muscular deconditioning   ? Obesity   ? Pacemaker   ? Pericardial effusion   ? SVT (supraventricular tachycardia) ([employer])   ? a. In 2013 she had an EPS with  ablation for SVT which did not eliminate the SVT completely. She also had bradycardia which limited medication. She was placed on amiodarone by Dr. Lovena Le.  ? Thrombocytopenia ([place]) 11/21/2011  ? ? ?Past Surgical History:  ?Procedure Laterality Date  ? BREAST BIOPSY Right 11/19/2018  ? positive  ? EYE SURGERY    ? SUPRAVENTRICULAR TACHYCARDIA ABLATION N/A 10/11/2012  ? Procedure: SUPRAVENTRICULAR TACHYCARDIA ABLATION;  Surgeon: Evans Lance, MD;  Location: [facility] CATH LAB;  Service: Cardiovascular;  Laterality: N/A;  ? ? ?Family History  ?Problem Relation Age of Onset  ? Stroke Mother   ? Cancer Father   ?     prostate  ? Hypertension Daughter   ? Heart attack Brother   ?     x2  ? Hypertension Brother   ? Stroke Sister   ? Hypertension Sister   ? Breast cancer Maternal Aunt   ? ? ?Social History:  reports that she has never smoked. She has never used smokeless tobacco. She reports that she does not drink alcohol and does not use drugs. ? ?Allergies:  ?Allergies  ?Allergen Reactions  ? Aspirin Itching  ? Penicillins Itching and Rash  ?  DID THE REACTION INVOLVE: Swelling of the face/tongue/throat, SOB, or low BP? Yes ?Sudden or  severe rash/hives, skin peeling, or the inside of the mouth or nose? No ?Did it require medical treatment? Yes ?When did it last happen? "More than 10 years ago" ?If all above answers are "NO", may proceed with cephalosporin use. ? ?Tolerated full course of Unasyn in 2021.  ? Remeron [Mirtazapine] Other (See Comments)  ?  Unknown reaction  ? ? ?Medications: Scheduled: ? [START ON 02/09/2022] amiodarone  200 mg Oral Once per day on Mon Tue Wed Thu Fri Sat  ? atorvastatin  10 mg Oral QHS  ? folic acid  0.5 mg Oral q morning  ? guaiFENesin  600 mg Oral BID  ? insulin aspart  0-9 Units Subcutaneous Q4H  ? insulin glargine  8 Units Subcutaneous QHS  ? metoprolol succinate  25 mg Oral Daily  ? pantoprazole (PROTONIX) IV  40 mg Intravenous Q12H  ? vitamin B-12  500 mcg Oral q morning  ? ?Continuous: ?  ampicillin-sulbactam (UNASYN) IV Stopped (02/08/22 6962)  ? lactated ringers 50 mL/hr at 02/08/22 0835  ? ? ?Results for orders placed or performed during the hospital encounter of 02/07/22 (from the past 24 hour(s))  ?POC occult blood, ED     Status: None  ? Collection Time: 02/07/22  6:51 PM  ?Result Value Ref Range  ? Fecal Occult Bld NEGATIVE NEGATIVE  ?Basic metabolic panel     Status: Abnormal  ? Collection Time: 02/07/22  7:13 PM  ?Result Value Ref Range  ? Sodium 138 135 - 145 mmol/L  ? Potassium 4.1 3.5 - 5.1 mmol/L  ? Chloride 103 98 - 111 mmol/L  ? CO2 27 22 - 32 mmol/L  ? Glucose, Bld 285 (H) 70 - 99 mg/dL  ? BUN 32 (H) 8 - 23 mg/dL  ? Creatinine, Ser 1.63 (H) 0.44 - 1.00 mg/dL  ? Calcium 8.6 (L) 8.9 - 10.3 mg/dL  ? GFR, Estimated 31 (L) >60 mL/min  ? Anion gap 8 5 - 15  ?CBC with Differential     Status: Abnormal  ? Collection Time: 02/07/22  7:13 PM  ?Result Value Ref Range  ? WBC 4.8 4.0 - 10.5 K/uL  ? RBC 3.57 (L) 3.87 - 5.11 MIL/uL  ? Hemoglobin 10.8 (L) 12.0 - 15.0 g/dL  ? HCT 33.4 (L) 36.0 - 46.0 %  ? MCV 93.6 80.0 - 100.0 fL  ? MCH 30.3 26.0 - 34.0 pg  ? MCHC 32.3 30.0 - 36.0 g/dL  ? RDW 14.0 11.5 - 15.5 %  ? Platelets 148 (L) 150 - 400 K/uL  ? nRBC 0.0 0.0 - 0.2 %  ? Neutrophils Relative % 60 %  ? Neutro Abs 2.9 1.7 - 7.7 K/uL  ? Lymphocytes Relative 28 %  ? Lymphs Abs 1.3 0.7 - 4.0 K/uL  ? Monocytes Relative 8 %  ? Monocytes Absolute 0.4 0.1 - 1.0 K/uL  ? Eosinophils Relative 3 %  ? Eosinophils Absolute 0.2 0.0 - 0.5 K/uL  ? Basophils Relative 0 %  ? Basophils Absolute 0.0 0.0 - 0.1 K/uL  ? Immature Granulocytes 1 %  ? Abs Immature Granulocytes 0.05 0.00 - 0.07 K/uL  ?Troponin I (High Sensitivity)     Status: Abnormal  ? Collection Time: 02/07/22  7:13 PM  ?Result Value Ref Range  ? Troponin I (High Sensitivity) 41 (H) <18 ng/L  ?Brain natriuretic peptide     Status: Abnormal  ? Collection Time: 02/07/22  7:13 PM  ?Result Value Ref Range  ? B Natriuretic Peptide 175.1 (H)  0.0 - 100.0 pg/mL   ?Protime-INR     Status: None  ? Collection Time: 02/07/22  7:13 PM  ?Result Value Ref Range  ? Prothrombin Time 13.7 11.4 - 15.2 seconds  ? INR 1.1 0.8 - 1.2  ?Resp Panel by RT-PCR (Flu A&B, Covid) Nasopharyngeal Swab     Status: None  ? Collection Time: 02/07/22  7:16 PM  ? Specimen: Nasopharyngeal Swab; Nasopharyngeal(NP) swabs in vial transport medium  ?Result Value Ref Range  ? SARS Coronavirus 2 by RT PCR NEGATIVE NEGATIVE  ? Influenza A by PCR NEGATIVE NEGATIVE  ? Influenza B by PCR NEGATIVE NEGATIVE  ?Troponin I (High Sensitivity)     Status: Abnormal  ? Collection Time: 02/07/22  9:36 PM  ?Result Value Ref Range  ? Troponin I (High Sensitivity) 40 (H) <18 ng/L  ?Troponin I (High Sensitivity)     Status: Abnormal  ? Collection Time: 02/08/22 12:23 AM  ?Result Value Ref Range  ? Troponin I (High Sensitivity) 43 (H) <18 ng/L  ?Hemoglobin A1c     Status: Abnormal  ? Collection Time: 02/08/22  5:13 AM  ?Result Value Ref Range  ? Hgb A1c MFr Bld 10.2 (H) 4.8 - 5.6 %  ? Mean Plasma Glucose 246.04 mg/dL  ?CBC     Status: Abnormal  ? Collection Time: 02/08/22  5:13 AM  ?Result Value Ref Range  ? WBC 5.0 4.0 - 10.5 K/uL  ? RBC 3.59 (L) 3.87 - 5.11 MIL/uL  ? Hemoglobin 10.7 (L) 12.0 - 15.0 g/dL  ? HCT 33.3 (L) 36.0 - 46.0 %  ? MCV 92.8 80.0 - 100.0 fL  ? MCH 29.8 26.0 - 34.0 pg  ? MCHC 32.1 30.0 - 36.0 g/dL  ? RDW 14.1 11.5 - 15.5 %  ? Platelets 147 (L) 150 - 400 K/uL  ? nRBC 0.0 0.0 - 0.2 %  ?Basic metabolic panel     Status: Abnormal  ? Collection Time: 02/08/22  5:13 AM  ?Result Value Ref Range  ? Sodium 139 135 - 145 mmol/L  ? Potassium 3.9 3.5 - 5.1 mmol/L  ? Chloride 101 98 - 111 mmol/L  ? CO2 28 22 - 32 mmol/L  ? Glucose, Bld 217 (H) 70 - 99 mg/dL  ? BUN 25 (H) 8 - 23 mg/dL  ? Creatinine, Ser 1.57 (H) 0.44 - 1.00 mg/dL  ? Calcium 8.5 (L) 8.9 - 10.3 mg/dL  ? GFR, Estimated 32 (L) >60 mL/min  ? Anion gap 10 5 - 15  ?CBG monitoring, ED     Status: Abnormal  ? Collection Time: 02/08/22  7:23 AM  ?Result Value Ref  Range  ? Glucose-Capillary 204 (H) 70 - 99 mg/dL  ?CBG monitoring, ED     Status: Abnormal  ? Collection Time: 02/08/22 11:47 AM  ?Result Value Ref Range  ? Glucose-Capillary 196 (H) 70 - 99 mg/dL  ?CBC

## 2022-02-08 NOTE — Progress Notes (Signed)
?                                  PROGRESS NOTE                                             ?                                                                                                                     ?                                         ? ? Patient Demographics:  ? ? [name], is a 88 y.o. female, DOB - Apr 17, 1933, HKV:[id number] ? ?Outpatient Primary MD for the patient is Pcp, No    LOS - 0  Admit date - 02/07/2022   ? ?Chief Complaint  ?Patient presents with  ? Weakness  ?    ? ?Brief Narrative (HPI from H&P)    88 y.o. female with medical history significant of chronic combined CHF, CKD stage IIIb, CVA, insulin-dependent type 2 diabetes, recurrent DVT on Eliquis, hypertension, SVT/atrial tachycardia status post PPM, breast cancer on antiestrogen therapy presented to the ED with complaints of cough, shortness of breath, dark tarry stools, and fatigue.  Work-up was consistent with pneumonia and possible upper GI bleed. ? ? Subjective:  ? ? Keniah Blankenbeckler today has, No headache, No chest pain, No abdominal pain - No Nausea, No new weakness tingling or numbness, improved cough and no SOB. ? ? Assessment  & Plan :  ? ?Pneumonia.  Rule out aspiration pneumonia.  She has been placed on Unasyn, seems to be responding well to treatment, not septic, not on oxygen.  Follow cultures and continue Unasyn.  Speech therapist to evaluate ? ?2. 8 mm nodular density in the right upper lobe - outpatient age-appropriate PCP follow-up and monitoring. ? ?3.  Dark stool.  Could have had upper GI bleed, GI has been consulted, H&H is stable, continue PPI await GI input.  Will monitor CBC closely. ? ?4.  Chronic systolic and diastolic heart failure EF 30%.  Appears euvolemic.  Since n.p.o. gentle IV fluids once eating and drinking we will discontinue IV fluids.  Resume home dose beta-blocker, resume diuretics once oral intake has stabilized. ? ?5.  CKD 3B.  Creatinine of 1.6 appears to be  baseline. ? ?6.  History of SVT, atrial tachycardia.  Tachybradycardia syndrome.  S/p pacemaker.  Continue beta-blocker and amiodarone combination.  Currently no chest pain, palpitation or cardiac symptoms at all. ? ?7.  Recurrent DVTs.  Eliquis on hold due to #3 above.  If H&H stabilizes resume Eliquis.  For now SCDs. ? ?8.  History of breast cancer.  On antiestrogen therapy.  Resume after discharge and follow-up with primary oncologist ? ?9.  Dyslipidemia.  On statin. ? ?10.  DM type II.  Half home dose of Lantus along with every 6 sliding scale as she is n.p.o. ? ?Lab Results  ?Component Value Date  ? HGBA1C 10.2 (H) 02/08/2022  ? ?CBG (last 3)  ?Recent Labs  ?  02/08/22 ?0723  ?GLUCAP 204*  ? ? ? ?   ? ?Condition - Extremely Guarded ? ?Family Communication  :  daughter [phone] - 02/08/22 ? ?Code Status :  Full ? ?Consults  :  None ? ?PUD Prophylaxis : PPI ? ? Procedures  :    ? ?CT - Cardiovascular: Heart is mildly enlarged. Pacemaker is present. There is no pericardial effusion. Aorta is nondilated. Mediastinum/Nodes: No enlarged mediastinal or axillary lymph nodes. Thyroid gland, trachea, and esophagus demonstrate no significant findings. Lungs/Pleura: Patchy multifocal ground-glass and airspace opacities are seen in the bilateral lower lobes, right greater than left. There is some atelectasis in lingula. There are minimal tree-in-bud opacities and ground-glass opacities in the inferior left upper lobe. No pleural effusion or pneumothorax identified. There is a 8 mm nodular density in the right upper lobe image 4/57. Upper Abdomen: No acute abnormality. Musculoskeletal: There is no acute fracture or dislocation. Multilevel degenerative changes affect the spine. Degenerative changes also affect both shoulders.  ? ?   ? ?Disposition Plan  :   ? ?Status is: Observation ? ?DVT Prophylaxis  :   ? ? ?Lab Results  ?Component Value Date  ? PLT 147 (L) 02/08/2022  ? ? ?Diet :  ?Diet Order   ? ?       ?  Diet NPO  time specified Except for: Sips with Meds  Diet effective now       ?  ? ?  ?  ? ?  ?  ? ?Inpatient Medications ? ?Scheduled Meds: ? amiodarone  200 mg Oral Daily  ? atorvastatin  20 mg Oral Daily  ? guaiFENesin  600 mg Oral BID  ? insulin aspart  0-9 Units Subcutaneous Q4H  ? insulin aspart  0-9 Units Subcutaneous Q6H  ? insulin glargine  8 Units Subcutaneous QHS  ? metoprolol succinate  25 mg Oral Daily  ? pantoprazole (PROTONIX) IV  40 mg Intravenous Q12H  ? ?Continuous Infusions: ? ampicillin-sulbactam (UNASYN) IV Stopped (02/08/22 6948)  ? ?PRN Meds:.acetaminophen **OR** acetaminophen ? ?Antibiotics  :   ? ?Anti-infectives (From admission, onward)  ? ? Start     Dose/Rate Route Frequency Ordered Stop  ? 02/10/22 0600  levofloxacin (LEVAQUIN) IVPB 750 mg  Status:  Discontinued       ? 750 mg ?100 mL/hr over 90 Minutes Intravenous Every 48 hours 02/08/22 0444 02/08/22 0500  ? 02/08/22 0600  Ampicillin-Sulbactam (UNASYN) 3 g in sodium chloride 0.9 % 100 mL IVPB       ? 3 g ?200 mL/hr over 30 Minutes Intravenous Every 12 hours 02/08/22 0511    ? 02/08/22 0430  levofloxacin (LEVAQUIN) IVPB 750 mg  Status:  Discontinued       ? 750 mg ?100 mL/hr over 90 Minutes Intravenous  Once 02/08/22 0427 02/08/22 0500  ? ?  ? ? ? Time Spent in minutes  30 ? ? ?Lala Lund M.D on 02/08/2022 at 8:25 AM ? ?To page go to [email or web address]  ? ?[facility] -  Office  [phone] ? ?See all Orders from today  for further details ? ? ? Objective:  ? ?Vitals:  ? 02/08/22 0300 02/08/22 0409 02/08/22 0519 02/08/22 0725  ?BP: 118/72 (!) 141/64  132/74  ?Pulse: (!) 59 60  60  ?Resp: '19 18  18  '$ ?Temp:   98.6 ?F (37 ?C)   ?TempSrc:   Oral   ?SpO2: 97% 100%  100%  ?Weight:  86 kg    ?Height:  '5\' 5"'$  (1.651 m)    ? ? ?Wt Readings from Last 3 Encounters:  ?02/08/22 86 kg  ?12/10/21 86 kg  ?11/15/21 88.9 kg  ? ? ? ?Intake/Output Summary (Last 24 hours) at 02/08/2022 0825 ?Last data filed at 02/08/2022 6333 ?Gross per 24 hour  ?Intake 100.09 ml   ?Output --  ?Net 100.09 ml  ? ? ? ?Physical Exam ? ?Awake Alert, No new F.N deficits, Normal affect ?[place].AT,PERRAL ?Supple Neck, No JVD,   ?Symmetrical Chest wall movement, Good air movement bilaterally, CTAB ?RRR,No Gallops,Rubs or new Murmurs,  ?+ve B.Sounds, Abd Soft, No tenderness,   ?No Cyanosis, Clubbing or edema  ?  ?  ? ? Data Review:  ? ? ?CBC ?Recent Labs  ?Lab 02/07/22 ?1913 02/08/22 ?5456  ?WBC 4.8 5.0  ?HGB 10.8* 10.7*  ?HCT 33.4* 33.3*  ?PLT 148* 147*  ?MCV 93.6 92.8  ?MCH 30.3 29.8  ?MCHC 32.3 32.1  ?RDW 14.0 14.1  ?LYMPHSABS 1.3  --   ?MONOABS 0.4  --   ?EOSABS 0.2  --   ?BASOSABS 0.0  --   ? ? ?Electrolytes ?Recent Labs  ?Lab 02/07/22 ?1913 02/08/22 ?2563  ?NA 138 139  ?K 4.1 3.9  ?CL 103 101  ?CO2 27 28  ?GLUCOSE 285* 217*  ?BUN 32* 25*  ?CREATININE 1.63* 1.57*  ?CALCIUM 8.6* 8.5*  ?INR 1.1  --   ?HGBA1C  --  10.2*  ?BNP 175.1*  --   ? ? ?------------------------------------------------------------------------------------------------------------------ ?No results for input(s): CHOL, HDL, LDLCALC, TRIG, CHOLHDL, LDLDIRECT in the last 72 hours. ? ?Lab Results  ?Component Value Date  ? HGBA1C 10.2 (H) 02/08/2022  ? ? ?No results for input(s): TSH, T4TOTAL, T3FREE, THYROIDAB in the last 72 hours. ? ?Invalid input(s): FREET3 ?------------------------------------------------------------------------------------------------------------------ ?ID Labs ?Recent Labs  ?Lab 02/07/22 ?1913 02/08/22 ?8937  ?WBC 4.8 5.0  ?PLT 148* 147*  ?CREATININE 1.63* 1.57*  ? ?Cardiac Enzymes ?No results for input(s): CKMB, TROPONINI, MYOGLOBIN in the last 168 hours. ? ?Invalid input(s): CK ? ? ?Radiology Reports ?DG Chest 2 View ? ?Result Date: 02/07/2022 ?CLINICAL DATA:  Productive cough.  Evaluate for pneumonia. EXAM: CHEST - 2 VIEW COMPARISON:  Chest x-ray 10/08/2020 FINDINGS: Left chest wall dual lead pacemaker in similar position. The heart and mediastinal contours are unchanged. Aortic calcification. Lingular scarring  versus linear atelectasis. Right mid lung zone linear atelectasis versus scarring. No focal consolidation. No pulmonary edema. No pleural effusion. No pneumothorax. No acute osseous abnormality. Degenerative chan

## 2022-02-08 NOTE — Consult Note (Signed)
Reason for Consult: Mel[place] ?Referring Physician: [facility] ? ?[name] ?HPI:  This is an 88 year old female with a PMH of CHF with an EF of 30%, CKD, DM, recurrent DVT on Eliquis, HTN, SVT s/p PPM, and CVA admitted for a pneumonia.  She also reported having some issues with dark tarry stools and complaints of GERD.  The melenic stools started last Thursday.  At her nursing facility she was found to be heme positive, but the repeat hemoccult was negative.  However, the report was that there was not enough stool obtained for adequate testing.  Her HGB on admission was at 10.4 g/dL, which is her baseline.  An EUS was performed in 2012 to evaluated her dilated CBD, but no abnormalities were noted outside of a mildly dilated CBD at 9 mm.  A CT scan of the chest is positive for ground glass opacities in the bilateral lung fields as well as the LUL. ? ?Past Medical History:  ?Diagnosis Date  ? Abnormal echocardiogram   ? a. possible mass on echo 05/2019 on PPM, b. no evidence of mass / atrial lead vegetation on follow up echo 06/2019  ? Anemia   ? Arthritis   ? Breast cancer ([place]) 10/2018  ? Invasive ductal carcinoma  ? Cancer [facility])   ? Cataract   ? Chronic combined systolic and diastolic CHF (congestive heart failure) ([place])   ? a. Previously diastolic but patient AVS from outside hospital listed systolic CHF and cardiomyopathy, records pending.  ? CKD (chronic kidney disease), stage III ([place])   ? Clotting disorder ([place])   ? CVA (cerebral vascular accident) [facility])   ? residual right sided weakness and mild dysphagia  ? Diabetes mellitus ([place])   ? Dizziness and giddiness   ? DVT (deep venous thrombosis) ([place])   ? a. on anticoagulation for this.  ? Essential hypertension   ? LBBB (left bundle branch block)   ? Mitral regurgitation   ? a. Mod MR by echo 2014.  ? Muscular deconditioning   ? Obesity   ? Pacemaker   ? Pericardial effusion   ? SVT (supraventricular tachycardia) ([place])   ? a. In 2013 she had an EPS with  ablation for SVT which did not eliminate the SVT completely. She also had bradycardia which limited medication. She was placed on amiodarone by Dr. Lovena Le.  ? Thrombocytopenia ([place]) 11/21/2011  ? ? ?Past Surgical History:  ?Procedure Laterality Date  ? BREAST BIOPSY Right 11/19/2018  ? positive  ? EYE SURGERY    ? SUPRAVENTRICULAR TACHYCARDIA ABLATION N/A 10/11/2012  ? Procedure: SUPRAVENTRICULAR TACHYCARDIA ABLATION;  Surgeon: Evans Lance, MD;  [facility] C[facility] LAB;  Service: Cardiovascular;  Laterality: N/A;  ? ? ?Family History  ?Problem Relation Age of Onset  ? Stroke Mother   ? Cancer Father   ?     prostate  ? Hypertension Daughter   ? Heart attack Brother   ?     x2  ? Hypertension Brother   ? Stroke Sister   ? Hypertension Sister   ? Breast cancer Maternal Aunt   ? ? ?Social History:  reports that she has never smoked. She has never used smokeless tobacco. She reports that she does not drink alcohol and does not use drugs. ? ?Allergies:  ?Allergies  ?Allergen Reactions  ? Aspirin Itching  ? Penicillins Itching and Rash  ?  DID THE REACTION INVOLVE: Swelling of the face/tongue/throat, SOB, or low BP? Yes ?Sudden or  severe rash/hives, skin peeling, or the inside of the mouth or nose? No ?Did it require medical treatment? Yes ?When did it last happen? "More than 10 years ago" ?If all above answers are "NO", may proceed with cephalosporin use. ? ?Tolerated full course of Unasyn in 2021.  ? Remeron [Mirtazapine] Other (See Comments)  ?  Unknown reaction  ? ? ?Medications: Scheduled: ? [START ON 02/09/2022] amiodarone  200 mg Oral Once per day on Mon Tue Wed Thu Fri Sat  ? atorvastatin  10 mg Oral QHS  ? folic acid  0.5 mg Oral q morning  ? guaiFENesin  600 mg Oral BID  ? insulin aspart  0-9 Units Subcutaneous Q4H  ? insulin glargine  8 Units Subcutaneous QHS  ? metoprolol succinate  25 mg Oral Daily  ? pantoprazole (PROTONIX) IV  40 mg Intravenous Q12H  ? vitamin B-12  500 mcg Oral q morning  ? ?Continuous: ?  ampicillin-sulbactam (UNASYN) IV Stopped (02/08/22 0355)  ? lactated ringers 50 mL/hr at 02/08/22 0835  ? ? ?Results for orders placed or performed during the hospital encounter of 02/07/22 (from the past 24 hour(s))  ?POC occult blood, ED     Status: None  ? Collection Time: 02/07/22  6:51 PM  ?Result Value Ref Range  ? Fecal Occult Bld NEGATIVE NEGATIVE  ?Basic metabolic panel     Status: Abnormal  ? Collection Time: 02/07/22  7:13 PM  ?Result Value Ref Range  ? Sodium 138 135 - 145 mmol/L  ? Potassium 4.1 3.5 - 5.1 mmol/L  ? Chloride 103 98 - 111 mmol/L  ? CO2 27 22 - 32 mmol/L  ? Glucose, Bld 285 (H) 70 - 99 mg/dL  ? BUN 32 (H) 8 - 23 mg/dL  ? Creatinine, Ser 1.63 (H) 0.44 - 1.00 mg/dL  ? Calcium 8.6 (L) 8.9 - 10.3 mg/dL  ? GFR, Estimated 31 (L) >60 mL/min  ? Anion gap 8 5 - 15  ?CBC with Differential     Status: Abnormal  ? Collection Time: 02/07/22  7:13 PM  ?Result Value Ref Range  ? WBC 4.8 4.0 - 10.5 K/uL  ? RBC 3.57 (L) 3.87 - 5.11 MIL/uL  ? Hemoglobin 10.8 (L) 12.0 - 15.0 g/dL  ? HCT 33.4 (L) 36.0 - 46.0 %  ? MCV 93.6 80.0 - 100.0 fL  ? MCH 30.3 26.0 - 34.0 pg  ? MCHC 32.3 30.0 - 36.0 g/dL  ? RDW 14.0 11.5 - 15.5 %  ? Platelets 148 (L) 150 - 400 K/uL  ? nRBC 0.0 0.0 - 0.2 %  ? Neutrophils Relative % 60 %  ? Neutro Abs 2.9 1.7 - 7.7 K/uL  ? Lymphocytes Relative 28 %  ? Lymphs Abs 1.3 0.7 - 4.0 K/uL  ? Monocytes Relative 8 %  ? Monocytes Absolute 0.4 0.1 - 1.0 K/uL  ? Eosinophils Relative 3 %  ? Eosinophils Absolute 0.2 0.0 - 0.5 K/uL  ? Basophils Relative 0 %  ? Basophils Absolute 0.0 0.0 - 0.1 K/uL  ? Immature Granulocytes 1 %  ? Abs Immature Granulocytes 0.05 0.00 - 0.07 K/uL  ?Troponin I (High Sensitivity)     Status: Abnormal  ? Collection Time: 02/07/22  7:13 PM  ?Result Value Ref Range  ? Troponin I (High Sensitivity) 41 (H) <18 ng/L  ?Brain natriuretic peptide     Status: Abnormal  ? Collection Time: 02/07/22  7:13 PM  ?Result Value Ref Range  ? B Natriuretic Peptide 175.1 (H)  0.0 - 100.0 pg/mL   ?Protime-INR     Status: None  ? Collection Time: 02/07/22  7:13 PM  ?Result Value Ref Range  ? Prothrombin Time 13.7 11.4 - 15.2 seconds  ? INR 1.1 0.8 - 1.2  ?Resp Panel by RT-PCR (Flu A&B, Covid) Nasopharyngeal Swab     Status: None  ? Collection Time: 02/07/22  7:16 PM  ? Specimen: Nasopharyngeal Swab; Nasopharyngeal(NP) swabs in vial transport medium  ?Result Value Ref Range  ? SARS Coronavirus 2 by RT PCR NEGATIVE NEGATIVE  ? Influenza A by PCR NEGATIVE NEGATIVE  ? Influenza B by PCR NEGATIVE NEGATIVE  ?Troponin I (High Sensitivity)     Status: Abnormal  ? Collection Time: 02/07/22  9:36 PM  ?Result Value Ref Range  ? Troponin I (High Sensitivity) 40 (H) <18 ng/L  ?Troponin I (High Sensitivity)     Status: Abnormal  ? Collection Time: 02/08/22 12:23 AM  ?Result Value Ref Range  ? Troponin I (High Sensitivity) 43 (H) <18 ng/L  ?Hemoglobin A1c     Status: Abnormal  ? Collection Time: 02/08/22  5:13 AM  ?Result Value Ref Range  ? Hgb A1c MFr Bld 10.2 (H) 4.8 - 5.6 %  ? Mean Plasma Glucose 246.04 mg/dL  ?CBC     Status: Abnormal  ? Collection Time: 02/08/22  5:13 AM  ?Result Value Ref Range  ? WBC 5.0 4.0 - 10.5 K/uL  ? RBC 3.59 (L) 3.87 - 5.11 MIL/uL  ? Hemoglobin 10.7 (L) 12.0 - 15.0 g/dL  ? HCT 33.3 (L) 36.0 - 46.0 %  ? MCV 92.8 80.0 - 100.0 fL  ? MCH 29.8 26.0 - 34.0 pg  ? MCHC 32.1 30.0 - 36.0 g/dL  ? RDW 14.1 11.5 - 15.5 %  ? Platelets 147 (L) 150 - 400 K/uL  ? nRBC 0.0 0.0 - 0.2 %  ?Basic metabolic panel     Status: Abnormal  ? Collection Time: 02/08/22  5:13 AM  ?Result Value Ref Range  ? Sodium 139 135 - 145 mmol/L  ? Potassium 3.9 3.5 - 5.1 mmol/L  ? Chloride 101 98 - 111 mmol/L  ? CO2 28 22 - 32 mmol/L  ? Glucose, Bld 217 (H) 70 - 99 mg/dL  ? BUN 25 (H) 8 - 23 mg/dL  ? Creatinine, Ser 1.57 (H) 0.44 - 1.00 mg/dL  ? Calcium 8.5 (L) 8.9 - 10.3 mg/dL  ? GFR, Estimated 32 (L) >60 mL/min  ? Anion gap 10 5 - 15  ?CBG monitoring, ED     Status: Abnormal  ? Collection Time: 02/08/22  7:23 AM  ?Result Value Ref  Range  ? Glucose-Capillary 204 (H) 70 - 99 mg/dL  ?CBG monitoring, ED     Status: Abnormal  ? Collection Time: 02/08/22 11:47 AM  ?Result Value Ref Range  ? Glucose-Capillary 196 (H) 70 - 99 mg/dL  ?CBC

## 2022-02-08 NOTE — Evaluation (Signed)
Clinical/Bedside Swallow Evaluation ?Patient Details  ?Name: [name] ?MRN: [id number] ?Date of Birth: [birth year] ? ?Today's Date: 02/08/2022 ?Time: SLP Start Time (ACUTE ONLY): 0900 SLP Stop Time (ACUTE ONLY): 0915 ?SLP Time Calculation (min) (ACUTE ONLY): 15 min ? ?Past Medical History:  ?Past Medical History:  ?Diagnosis Date  ? Abnormal echocardiogram   ? a. possible mass on echo 05/2019 on PPM, b. no evidence of mass / atrial lead vegetation on follow up echo 06/2019  ? Anemia   ? Arthritis   ? Breast cancer ([place]) 10/2018  ? Invasive ductal carcinoma  ? Cancer [facility])   ? Cataract   ? Chronic combined systolic and diastolic CHF (congestive heart failure) ([place])   ? a. Previously diastolic but patient AVS from outside hospital listed systolic CHF and cardiomyopathy, records pending.  ? CKD (chronic kidney disease), stage III ([place])   ? Clotting disorder ([place])   ? CVA (cerebral vascular accident) [facility])   ? residual right sided weakness and mild dysphagia  ? Diabetes mellitus ([place])   ? Dizziness and giddiness   ? DVT (deep venous thrombosis) ([place])   ? a. on anticoagulation for this.  ? Essential hypertension   ? LBBB (left bundle branch block)   ? Mitral regurgitation   ? a. Mod MR by echo 2014.  ? Muscular deconditioning   ? Obesity   ? Pacemaker   ? Pericardial effusion   ? SVT (supraventricular tachycardia) ([place])   ? a. In 2013 she had an EPS with ablation for SVT which did not eliminate the SVT completely. She also had bradycardia which limited medication. She was placed on amiodarone by Dr. Lovena Le.  ? Thrombocytopenia ([place]) 11/21/2011  ? ?Past Surgical History:  ?Past Surgical History:  ?Procedure Laterality Date  ? BREAST BIOPSY Right 11/19/2018  ? positive  ? EYE SURGERY    ? SUPRAVENTRICULAR TACHYCARDIA ABLATION N/A 10/11/2012  ? Procedure: SUPRAVENTRICULAR TACHYCARDIA ABLATION;  Surgeon: Evans Lance, MD;  Location: [facility] CATH LAB;  Service: Cardiovascular;  Laterality: N/A;  ? ?HPI:  ?88 y.o. female with  medical history significant of chronic combined CHF, CKD stage IIIb, CVA, insulin-dependent type 2 diabetes, recurrent DVT on Eliquis, hypertension, SVT/atrial tachycardia status post PPM, breast cancer on antiestrogen therapy presented to the ED with complaints of cough, shortness of breath, dark tarry stools, and fatigue.  Work-up was consistent with pneumonia and possible upper GI bleed.CXR shows bilateral ground glass opacities. MD request to rule out aspiration pna.Pt has a history of primary esopahgeal dysphagia with dilatation. Dysmotility seen on 2020 esophagram. Prior BSE in 2021 recommended esopahgeal precautions, soft diet and esophageal interventions as appropriate.  ?  ?Assessment / Plan / Recommendation  ?Clinical Impression ? Pt likely experiencing aspiraiton pna as a result of previously diagnosed and chronic esophageal dysphagia. Pt is a good historian and able to verbalize awareness of esophageal dysphagia including symptoms and strategies. She reports that her foods and liquids stop mid sternum and does report she often regurgitated her food and coughs/chokes. She has previously been on a pureed diet at her SNF but upgraded back to soft solids because the pureed food was intolerable. She independently states she is up to a chair/table for meals, follows solids with liquids and takes rest breaks for digestion. She also reports a 1500 mL fluid restriction she must follow. Under observation she does hve delayed coughing after solids. Pt does report she would use a modified diet if needed to reduce risk of aspiration. It  is not clear if texture modification would benefit her. Will proceed with MBS for instrumental assessment of swallowing to determine if further strategies or diet modification would benefit pt at this point. RIsk of aspiration is likely to remain high given chronic nature of impairment. Pt may consume thin liquids and pureed solids in the meantime. ?SLP Visit Diagnosis: Dysphagia,  unspecified (R13.10) ?   ?Aspiration Risk ? Severe aspiration risk  ?  ?Diet Recommendation Dysphagia 1 (Puree);Thin liquid  ? ?Liquid Administration via: Straw;Cup ?Medication Administration: Crushed with puree ?Supervision: Patient able to self feed ?Compensations: Slow rate;Small sips/bites;Follow solids with liquid  ?  ?Other  Recommendations Oral Care Recommendations: Oral care BID   ? ?Recommendations for follow up therapy are one component of a multi-disciplinary discharge planning process, led by the attending physician.  Recommendations may be updated based on patient status, additional functional criteria and insurance authorization. ? ?Follow up Recommendations Skilled nursing-short term rehab (<3 hours/day)  ? ? ?  ?Assistance Recommended at Discharge None  ?Functional Status Assessment Patient has had a recent decline in their functional status and demonstrates the ability to make significant improvements in function in a reasonable and predictable amount of time.  ?Frequency and Duration    ?  ?  ?   ? ?Prognosis Prognosis for Safe Diet Advancement: Fair ?Barriers to Reach Goals: Severity of deficits  ? ?  ? ?Swallow Study   ?General HPI: 88 y.o. female with medical history significant of chronic combined CHF, CKD stage IIIb, CVA, insulin-dependent type 2 diabetes, recurrent DVT on Eliquis, hypertension, SVT/atrial tachycardia status post PPM, breast cancer on antiestrogen therapy presented to the ED with complaints of cough, shortness of breath, dark tarry stools, and fatigue.  Work-up was consistent with pneumonia and possible upper GI bleed.CXR shows bilateral ground glass opacities. MD request to rule out aspiration pna.Pt has a history of primary esopahgeal dysphagia with dilatation. Dysmotility seen on 2020 esophagram. Prior BSE in 2021 recommended esopahgeal precautions, soft diet and esophageal interventions as appropriate. ?Type of Study: Bedside Swallow Evaluation ?Previous Swallow  Assessment: see HPI ?Diet Prior to this Study: NPO ?Temperature Spikes Noted: No ?Respiratory Status: Room air ?History of Recent Intubation: No ?Behavior/Cognition: Alert;Cooperative;Pleasant mood ?Oral Cavity Assessment: Within Functional Limits ?Oral Care Completed by SLP: No ?Oral Cavity - Dentition: Dentures, top;Edentulous ?Vision: Functional for self-feeding ?Self-Feeding Abilities: Able to feed self ?Patient Positioning: Upright in bed ?Baseline Vocal Quality: Breathy;Low vocal intensity ?Volitional Cough: Strong ?Volitional Swallow: Able to elicit  ?  ?Oral/Motor/Sensory Function Overall Oral Motor/Sensory Function: Within functional limits   ?Ice Chips Ice chips: Not tested   ?Thin Liquid Thin Liquid: Within functional limits  ?  ?Nectar Thick Nectar Thick Liquid: Not tested   ?Honey Thick Honey Thick Liquid: Not tested   ?Puree Puree: Impaired ?Presentation: Self Fed ?Pharyngeal Phase Impairments: Cough - Delayed   ?Solid ? ? ?  Solid: Impaired ?Pharyngeal Phase Impairments: Cough - Delayed  ? ?  ? ?Ulric Salzman, Katherene Ponto ?02/08/2022,9:32 AM ? ? ? ? ?

## 2022-02-09 ENCOUNTER — Encounter (HOSPITAL_COMMUNITY): Payer: Self-pay | Admitting: Certified Registered"

## 2022-02-09 ENCOUNTER — Encounter (HOSPITAL_COMMUNITY)
Admission: EM | Disposition: A | Discharge status: Skilled Nursing Facility | Payer: Self-pay | Source: Home / Self Care | Attending: Internal Medicine

## 2022-02-09 ENCOUNTER — Encounter (HOSPITAL_COMMUNITY): Payer: Self-pay | Admitting: Internal Medicine

## 2022-02-09 ENCOUNTER — Inpatient Hospital Stay (HOSPITAL_COMMUNITY): Payer: Medicare Other

## 2022-02-09 DIAGNOSIS — R195 Other fecal abnormalities: Secondary | ICD-10-CM | POA: Diagnosis not present

## 2022-02-09 DIAGNOSIS — N1831 Chronic kidney disease, stage 3a: Secondary | ICD-10-CM | POA: Diagnosis not present

## 2022-02-09 DIAGNOSIS — J690 Pneumonitis due to inhalation of food and vomit: Secondary | ICD-10-CM | POA: Diagnosis not present

## 2022-02-09 LAB — COMPREHENSIVE METABOLIC PANEL
ALT: 23 U/L (ref 0–44)
AST: 25 U/L (ref 15–41)
Albumin: 2.6 g/dL — ABNORMAL LOW (ref 3.5–5.0)
Alkaline Phosphatase: 77 U/L (ref 38–126)
Anion gap: 6 (ref 5–15)
BUN: 21 mg/dL (ref 8–23)
CO2: 28 mmol/L (ref 22–32)
Calcium: 8.6 mg/dL — ABNORMAL LOW (ref 8.9–10.3)
Chloride: 109 mmol/L (ref 98–111)
Creatinine, Ser: 1.43 mg/dL — ABNORMAL HIGH (ref 0.44–1.00)
GFR, Estimated: 36 mL/min — ABNORMAL LOW (ref >60)
Glucose, Bld: 173 mg/dL — ABNORMAL HIGH (ref 70–99)
Potassium: 3.8 mmol/L (ref 3.5–5.1)
Sodium: 143 mmol/L (ref 135–145)
Total Bilirubin: 0.6 mg/dL (ref 0.3–1.2)
Total Protein: 6.1 g/dL — ABNORMAL LOW (ref 6.5–8.1)

## 2022-02-09 LAB — GLUCOSE, CAPILLARY
Glucose-Capillary: 134 mg/dL — ABNORMAL HIGH (ref 70–99)
Glucose-Capillary: 159 mg/dL — ABNORMAL HIGH (ref 70–99)
Glucose-Capillary: 193 mg/dL — ABNORMAL HIGH (ref 70–99)
Glucose-Capillary: 253 mg/dL — ABNORMAL HIGH (ref 70–99)
Glucose-Capillary: 257 mg/dL — ABNORMAL HIGH (ref 70–99)
Glucose-Capillary: 260 mg/dL — ABNORMAL HIGH (ref 70–99)

## 2022-02-09 LAB — CBC WITH DIFFERENTIAL/PLATELET
Abs Immature Granulocytes: 0.03 10*3/uL (ref 0.00–0.07)
Basophils Absolute: 0 10*3/uL (ref 0.0–0.1)
Basophils Relative: 0 %
Eosinophils Absolute: 0.2 10*3/uL (ref 0.0–0.5)
Eosinophils Relative: 3 %
HCT: 31.7 % — ABNORMAL LOW (ref 36.0–46.0)
Hemoglobin: 10.3 g/dL — ABNORMAL LOW (ref 12.0–15.0)
Immature Granulocytes: 1 %
Lymphocytes Relative: 25 %
Lymphs Abs: 1.4 10*3/uL (ref 0.7–4.0)
MCH: 30.1 pg (ref 26.0–34.0)
MCHC: 32.5 g/dL (ref 30.0–36.0)
MCV: 92.7 fL (ref 80.0–100.0)
Monocytes Absolute: 0.5 10*3/uL (ref 0.1–1.0)
Monocytes Relative: 10 %
Neutro Abs: 3.4 10*3/uL (ref 1.7–7.7)
Neutrophils Relative %: 61 %
Platelets: 153 10*3/uL (ref 150–400)
RBC: 3.42 MIL/uL — ABNORMAL LOW (ref 3.87–5.11)
RDW: 14 % (ref 11.5–15.5)
WBC: 5.5 10*3/uL (ref 4.0–10.5)
nRBC: 0 % (ref 0.0–0.2)

## 2022-02-09 LAB — BRAIN NATRIURETIC PEPTIDE: B Natriuretic Peptide: 356.8 pg/mL — ABNORMAL HIGH (ref 0.0–100.0)

## 2022-02-09 LAB — MAGNESIUM: Magnesium: 2.2 mg/dL (ref 1.7–2.4)

## 2022-02-09 LAB — PROCALCITONIN: Procalcitonin: <0.1 ng/mL

## 2022-02-09 LAB — C-REACTIVE PROTEIN: CRP: 1 mg/dL — ABNORMAL HIGH (ref <1.0)

## 2022-02-09 SURGERY — CANCELLED PROCEDURE

## 2022-02-09 MED ORDER — INSULIN ASPART 100 UNIT/ML IJ SOLN
0.0000 [IU] | Freq: Three times a day (TID) | INTRAMUSCULAR | Status: DC
  Administered 2022-02-09: 5 [IU] via SUBCUTANEOUS

## 2022-02-09 MED ORDER — INSULIN ASPART 100 UNIT/ML IJ SOLN
0.0000 [IU] | Freq: Three times a day (TID) | INTRAMUSCULAR | Status: DC
  Administered 2022-02-09 (×2): 5 [IU] via SUBCUTANEOUS
  Administered 2022-02-10: 1 [IU] via SUBCUTANEOUS
  Administered 2022-02-10 (×2): 2 [IU] via SUBCUTANEOUS
  Administered 2022-02-10: 3 [IU] via SUBCUTANEOUS
  Administered 2022-02-11: 2 [IU] via SUBCUTANEOUS

## 2022-02-09 MED ORDER — INSULIN ASPART 100 UNIT/ML IJ SOLN: 0.0000 [IU] | Freq: Three times a day (TID) | INTRAMUSCULAR | Status: DC

## 2022-02-09 SURGICAL SUPPLY — 15 items

## 2022-02-09 NOTE — Progress Notes (Signed)
Modified Barium Swallow Progress Note ? ?Patient Details  ?Name: [name] ?MRN: [id number] ?Date of Birth: February 18, 1933 ? ?Today's Date: 02/09/2022 ? ?Modified Barium Swallow completed.  Full report located under Chart Review in the Imaging Section. ? ?Brief recommendations include the following: ? ?Clinical Impression ? [name] was seen in radiology suite for MBS. Pt had top dentures in place during study, and reported her bottom dentures will be available soon. Pt presents with a primary esophageal dysphagia. Oral phase of swallowing impaired as evidenced by impaired mastication of soft-solid nutri grain bar. Delayed mastication and oral transit may be the result of missing bottom denture plate. Pharyngeal phase of swallowing initiated at level of the valleculae, which is WNL. Observed adequate hyoid elevation and anterior movement. Pt demonstrated adequate airway closure as evidenced by sufficient epiglottic deflection. Instances of flash penetration (PAS 2) occurred with thin liquids, however pt resolved penetrates independently. Flash penetration is a result of impaired timing. No penetration or aspiration observed with puree and soft-solid textures. SLP had pt consumed ~75% of soft-solid nutrigrain bar. After bite of nutrigrain bar, SLP facilitated supplemental thin liquid wash. Esophageal sweep revealed suspected esophageal dysmotility with soft-solids. Supplemental puree and liquid washes aided in clearance of solid stasis. SLP provided education to the pt regarding standard swallow precautions, such as sitting upright at a mealtime, staying upright for at least 30 mins following a meal, small bites/sips and following solids with liquids. Pt verbalized understanding of standard swallow precautions. At this time, pt may receive soft-solids and thin liquids. SLP will also discuss adding nighttime heartburn medication with pt's MD. ?  ?Swallow Evaluation Recommendations ? ? Recommended Consults: Consider  esophageal assessment ? ? SLP Diet Recommendations: Dysphagia 3 (Mech soft) solids;Thin liquid ? ? Liquid Administration via: Straw ? ? Medication Administration: Whole meds with liquid ? ? Supervision: Full assist for feeding ? ? Compensations: Slow rate;Small sips/bites;Follow solids with liquid ? ? Postural Changes: Remain semi-upright after after feeds/meals (Comment);Seated upright at 90 degrees ? ? Oral Care Recommendations: Oral care BID ? ?   ? ? ? ?Vaughan Sine ?02/09/2022,10:04 AM ?

## 2022-02-09 NOTE — Progress Notes (Signed)
Inpatient Diabetes Program Recommendations ? ?AACE/ADA: New Consensus Statement on Inpatient Glycemic Control (2015) ? ?Target Ranges:  Prepandial:   less than 140 mg/dL ?     Peak postprandial:   less than 180 mg/dL (1-2 hours) ?     Critically ill patients:  140 - 180 mg/dL  ? ?Lab Results  ?Component Value Date  ? GLUCAP 257 (H) 02/09/2022  ? HGBA1C 10.2 (H) 02/08/2022  ? ? ?Review of Glycemic Control ? Latest Reference Range & Units 02/09/22 00:26 02/09/22 04:31 02/09/22 07:41 02/09/22 11:48  ?Glucose-Capillary 70 - 99 mg/dL 193 (H) 159 (H) 134 (H) 257 (H)  ?(H): Data is abnormally high ?Diabetes history: Type 2 DM ?Outpatient Diabetes medications: Trulicity 1.5 mg qwk, Lantus 20 units QHS ?Current orders for Inpatient glycemic control: Semglee 8 units QHS, Novolog 0-9 units Q4H ? ?Inpatient Diabetes Program Recommendations:   ? ?Consider increasing Semglee to 12 units QHS.  ?Attempted to see patient, however was in procedure. Will reattempt 4/20.  ? ?Thanks, ?Bronson Curb, MSN, RNC-OB ?Diabetes Coordinator ?[phone] (8a-5p) ? ? ? ? ?

## 2022-02-09 NOTE — Evaluation (Signed)
Physical Therapy Evaluation ?Patient Details ?Name: [name] ?MRN: [id number] ?DOB: 1932-10-29 ?Today's Date: 02/09/2022 ? ?History of Present Illness ? Pt is a 88 y/o female presenting on 4/17 from SNF with cough, SOB, and dark stools. Found with PNA and possible GI bleed.  PMH includes: arthrits, anemia, CA, cataract, CKD, CVA, MD, DVT, HTN, LBBB, obestiy, pacemaker.  ?Clinical Impression ? Patient presents with generalized weakness, impaired balance and impaired mobility s/p above. Pt is from [facility] SNF and reports being mod I for ADLs and mobility at w/c level PTA. Pt does her own exercise at the gym riding the bike and doing there ex in her chair daily. Today, pt requires Min A for SPT from [facility] to chair and some assist with pericare. Reports feeling weaker than her baseline and some mild SOB reported. Reviewed there ex to perform during hospitalization as well encouraged OOB to chair for all meals to promote mobility. Will follow acutely to maximize independence and mobility prior to return to SNF.  ?   ? ?Recommendations for follow up therapy are one component of a multi-disciplinary discharge planning process, led by the attending physician.  Recommendations may be updated based on patient status, additional functional criteria and insurance authorization. ? ?Follow Up Recommendations Skilled nursing-short term rehab (<3 hours/day) ? ?  ?Assistance Recommended at Discharge Intermittent Supervision/Assistance  ?Patient can return home with the following ? A little help with bathing/dressing/bathroom;A little help with walking and/or transfers;Assist for transportation ? ?  ?Equipment Recommendations None recommended by PT  ?Recommendations for Other Services ?    ?  ?Functional Status Assessment Patient has had a recent decline in their functional status and demonstrates the ability to make significant improvements in function in a reasonable and predictable amount of time.  ? ?  ?Precautions /  Restrictions Precautions ?Precautions: Fall ?Precaution Comments: hx of L CVA with right hemiparesis ?Restrictions ?Weight Bearing Restrictions: No  ? ?  ? ?Mobility ? Bed Mobility ?  ?  ?  ?  ?  ?  ?  ?General bed mobility comments: Sitting on BSC upon PT arrival. ?  ? ?Transfers ?Overall transfer level: Needs assistance ?Equipment used: None ?Transfers: Sit to/from Stand, Bed to chair/wheelchair/BSC ?Sit to Stand: Min guard ?Stand pivot transfers: Min assist ?  ?  ?  ?  ?General transfer comment: Stood from [facility] x1, and performed SPT BSC to chair using BUEs on surfaces for support. ?  ? ?Ambulation/Gait ?  ?  ?  ?  ?  ?  ?  ?General Gait Details: Not ambulatory ? ?Stairs ?  ?  ?  ?  ?  ? ?Wheelchair Mobility ?  ? ?Modified Rankin (Stroke Patients Only) ?  ? ?  ? ?Balance Overall balance assessment: Needs assistance ?Sitting-balance support: Feet supported, No upper extremity supported ?Sitting balance-Leahy Scale: Good ?  ?  ?Standing balance support: During functional activity, Single extremity supported ?Standing balance-Leahy Scale: Poor ?Standing balance comment: Requires at least 1 UE support, able to perform pericare on self with close min guard, some help needed for thorough cleaning ?  ?  ?  ?  ?  ?  ?  ?  ?  ?  ?  ?   ? ? ? ?Pertinent Vitals/Pain Pain Assessment ?Pain Assessment: No/denies pain  ? ? ?Home Living Family/patient expects to be discharged to:: Skilled nursing facility ?  ?  ?  ?  ?  ?  ?  ?  ?  ?Additional  Comments: Pt reports using her wheelchari for mobility and being able to transfer to the bed, shower seat, 3:1 without assist.  She was bathing and dressing on her own as well per her report.  ?  ?Prior Function Prior Level of Function : Independent/Modified Independent ?  ?  ?  ?  ?  ?  ?Mobility Comments: Uses her feet to propel w/c,. "anywhere i want to go." ?ADLs Comments: Mod I ?  ? ? ?Hand Dominance  ? Dominant Hand: Right ? ?  ?Extremity/Trunk Assessment  ? Upper Extremity  Assessment ?Upper Extremity Assessment: Defer to OT evaluation ?RUE Deficits / Details: history of hemiparesis as well as shoulder crepitus.  AAROM shoulder flexion 0-90 degrees, AROM 0-50 degrees, end range pain.  Brunnstrum stage IV movement in the elbow with isolated digit movement and grip strength at 3+/5. ?RUE Sensation: WNL ?RUE Coordination: decreased gross motor ?LUE Deficits / Details: History of rotator cuff injury with crepitus noted during shoulder flexion/abduction.  AROM 0-110 degrees,  all other joints AROM WFLs with strength at 3+/5 throughout. ?LUE Sensation: WNL ?LUE Coordination: decreased gross motor ?  ? ?Lower Extremity Assessment ?Lower Extremity Assessment: RLE deficits/detail;LLE deficits/detail;Generalized weakness ?RLE Deficits / Details: Bad arthritis with malpositioning at knee joint so pt no longer walks. ?RLE Sensation: history of peripheral neuropathy ?RLE Coordination: decreased fine motor ?LLE Sensation: history of peripheral neuropathy ?  ? ?Cervical / Trunk Assessment ?Cervical / Trunk Assessment: Kyphotic  ?Communication  ? Communication: No difficulties  ?Cognition Arousal/Alertness: Awake/alert ?Behavior During Therapy: [facility] for tasks assessed/performed ?Overall Cognitive Status: Within Functional Limits for tasks assessed ?  ?  ?  ?  ?  ?  ?  ?  ?  ?  ?  ?  ?  ?  ?  ?  ?  ?  ?  ? ?  ?General Comments General comments (skin integrity, edema, etc.): HR up to 128 bpm with activity. ? ?  ?Exercises General Exercises - Lower Extremity ?Ankle Circles/Pumps: AROM, Both, 10 reps, Seated ?Long Arc Quad: AROM, Both, 5 reps, Seated ?Hip ABduction/ADduction: AROM, Both, 5 reps, Seated ?Hip Flexion/Marching: AROM, Both, 10 reps, Seated  ? ?Assessment/Plan  ?  ?PT Assessment Patient needs continued PT services  ?PT Problem List Decreased strength;Decreased mobility;Decreased balance;Impaired sensation ? ?   ?  ?PT Treatment Interventions Therapeutic exercise;Patient/family  education;Therapeutic activities;Wheelchair mobility training;Balance training;Functional mobility training   ? ?PT Goals (Current goals can be found in the Care Plan section)  ?Acute Rehab PT Goals ?Patient Stated Goal: to get stronger ?PT Goal Formulation: With patient ?Time For Goal Achievement: 02/24/22 ?Potential to Achieve Goals: Good ? ?  ?Frequency Min 2X/week ?  ? ? ?Co-evaluation   ?  ?  ?  ?  ? ? ?  ?AM-PAC PT "6 Clicks" Mobility  ?Outcome Measure Help needed turning from your back to your side while in a flat bed without using bedrails?: A Little ?Help needed moving from lying on your back to sitting on the side of a flat bed without using bedrails?: A Little ?Help needed moving to and from a bed to a chair (including a wheelchair)?: A Little ?Help needed standing up from a chair using your arms (e.g., wheelchair or bedside chair)?: A Little ?Help needed to walk in hospital room?: A Little ?Help needed climbing 3-5 steps with a railing? : A Little ?6 Click Score: 18 ? ?  ?End of Session   ?Activity Tolerance: Patient tolerated treatment well ?Patient left:  in chair;with call bell/phone within reach ?Nurse Communication: Mobility status ?PT Visit Diagnosis: Muscle weakness (generalized) (M62.81) ?  ? ?Time: 8381-8403 ?PT Time Calculation (min) (ACUTE ONLY): 17 min ? ? ?Charges:   PT Evaluation ?$PT Eval Moderate Complexity: 1 Mod ?  ?  ?   ? ? ?Marisa Severin, PT, DPT ?Acute Rehabilitation Services ?Secure chat preferred ?Office [phone] ? ? ? ? ?[place] ?02/09/2022, 2:21 PM ? ?

## 2022-02-09 NOTE — Progress Notes (Signed)
?                                  PROGRESS NOTE                                             ?                                                                                                                     ?                                         ? ? Patient Demographics:  ? ? [name], is a 88 y.o. female, DOB - November 16, 1932, LAG:[id number] ? ?Outpatient Primary MD for the patient is Pcp, No    LOS - 1  Admit date - 02/07/2022   ? ?Chief Complaint  ?Patient presents with  ? Weakness  ?    ? ?Brief Narrative (HPI from H&P)     ? ?88 y.o. female with medical history significant of chronic combined CHF, CKD stage IIIb, CVA, insulin-dependent type 2 diabetes, recurrent DVT on Eliquis, hypertension, SVT/atrial tachycardia status post PPM, breast cancer on antiestrogen therapy presented to the ED with complaints of cough, shortness of breath, dark tarry stools, and fatigue.  Work-up was consistent with pneumonia and possible upper GI bleed. ? ? Subjective:  ? ? [name] today reports some diarrhea after MBS study today, she denies any nausea, vomiting, chest pain or shortness of breath.   ? ? ? Assessment  & Plan :  ? ?Pneumonia.  ?- Continue with Unasyn ?-SLP input appreciated, low risk for aspiration. ? ? ?2. 8 mm nodular density in the right upper lobe  ?- outpatient age-appropriate PCP follow-up and monitoring. ? ?Dark stool.   ?-GI input greatly appreciated, initial plan for endoscopy today has been postponed given the fact she had Nutrigrain bar during her MBS study today, EGD has been postponed till tomorrow. ? ?Chronic systolic and diastolic heart failure EF 30%.  Appears euvolemic.  Since n.p.o. gentle IV fluids once eating and drinking we will discontinue IV fluids.  Resume home dose beta-blocker, resume diuretics once oral intake has stabilized. ? ?CKD 3B.  Creatinine of 1.6 appears to be baseline. ? ?History of SVT, atrial tachycardia.  Tachybradycardia syndrome.  S/p  pacemaker.  Continue beta-blocker and amiodarone combination.  Currently no chest pain, palpitation or cardiac symptoms at all. ? ?Recurrent DVTs.  Eliquis on hold due to #3 above.  If H&H stabilizes resume Eliquis.  For now SCDs. ? ?History of breast cancer.  On antiestrogen therapy.  Resume after discharge and follow-up with primary oncologist ? ?Dyslipidemia.  On statin. ? ?DM type  II.  Half home dose of Lantus along with every 6 sliding scale as she is n.p.o. ? ?Lab Results  ?Component Value Date  ? HGBA1C 10.2 (H) 02/08/2022  ? ?CBG (last 3)  ?Recent Labs  ?  02/09/22 ?0431 02/09/22 ?0741 02/09/22 ?1148  ?GLUCAP 159* 134* 257*  ? ? ? ?   ? ?Condition - Extremely Guarded ? ?Family Communication  :  None at bedside ? ?Code Status :  Full ? ?Consults  :  None ? ?PUD Prophylaxis : PPI ? ? Procedures  :    ? ?CT - Cardiovascular: Heart is mildly enlarged. Pacemaker is present. There is no pericardial effusion. Aorta is nondilated. Mediastinum/Nodes: No enlarged mediastinal or axillary lymph nodes. Thyroid gland, trachea, and esophagus demonstrate no significant findings. Lungs/Pleura: Patchy multifocal ground-glass and airspace opacities are seen in the bilateral lower lobes, right greater than left. There is some atelectasis in lingula. There are minimal tree-in-bud opacities and ground-glass opacities in the inferior left upper lobe. No pleural effusion or pneumothorax identified. There is a 8 mm nodular density in the right upper lobe image 4/57. Upper Abdomen: No acute abnormality. Musculoskeletal: There is no acute fracture or dislocation. Multilevel degenerative changes affect the spine. Degenerative changes also affect both shoulders.  ? ?   ? ?Disposition Plan  :   ? ?Status is: Observation ? ?DVT Prophylaxis  :   ? ? ?Lab Results  ?Component Value Date  ? PLT 153 02/09/2022  ? ? ?Diet :  ?Diet Order   ? ?       ?  Diet NPO time specified  Diet effective midnight       ?  ?  DIET DYS 3 Room service  appropriate? Yes; Fluid consistency: Thin  Diet effective now       ?  ? ?  ?  ? ?  ?  ? ?Inpatient Medications ? ?Scheduled Meds: ? amiodarone  200 mg Oral Once per day on Mon Tue Wed Thu Fri Sat  ? atorvastatin  10 mg Oral QHS  ? folic acid  0.5 mg Oral q morning  ? guaiFENesin  600 mg Oral BID  ? insulin aspart  0-9 Units Subcutaneous TID AC & HS  ? insulin glargine-yfgn  8 Units Subcutaneous QHS  ? metoprolol succinate  25 mg Oral Daily  ? pantoprazole (PROTONIX) IV  40 mg Intravenous Q12H  ? vitamin B-12  500 mcg Oral q morning  ? ?Continuous Infusions: ? ampicillin-sulbactam (UNASYN) IV 3 g (02/09/22 0523)  ? ?PRN Meds:.acetaminophen **OR** acetaminophen, guaiFENesin-dextromethorphan ? ?Antibiotics  :   ? ?Anti-infectives (From admission, onward)  ? ? Start     Dose/Rate Route Frequency Ordered Stop  ? 02/10/22 0600  levofloxacin (LEVAQUIN) IVPB 750 mg  Status:  Discontinued       ? 750 mg ?100 mL/hr over 90 Minutes Intravenous Every 48 hours 02/08/22 0444 02/08/22 0500  ? 02/08/22 0600  Ampicillin-Sulbactam (UNASYN) 3 g in sodium chloride 0.9 % 100 mL IVPB       ? 3 g ?200 mL/hr over 30 Minutes Intravenous Every 12 hours 02/08/22 0511    ? 02/08/22 0430  levofloxacin (LEVAQUIN) IVPB 750 mg  Status:  Discontinued       ? 750 mg ?100 mL/hr over 90 Minutes Intravenous  Once 02/08/22 0427 02/08/22 0500  ? ?  ? ? ? ? ?Phillips Climes M.D on 02/09/2022 at 3:06 PM ? ?To page go to [email or web address]  ? ?[facility] -  Office  [phone] ? ?See all Orders from today for further details ? ? ? Objective:  ? ?Vitals:  ? 02/09/22 0600 02/09/22 0700 02/09/22 0740 02/09/22 1145  ?BP:   (!) 147/73 (!) 154/76  ?Pulse: 60 61  63  ?Resp: '20 20  18  '$ ?Temp:   98.5 ?F (36.9 ?C) 98.6 ?F (37 ?C)  ?TempSrc:   Oral Oral  ?SpO2: 97% 98%  98%  ?Weight:      ?Height:      ? ? ?Wt Readings from Last 3 Encounters:  ?02/08/22 86.4 kg  ?12/10/21 86 kg  ?11/15/21 88.9 kg  ? ? ? ?Intake/Output Summary (Last 24 hours) at 02/09/2022  1506 ?Last data filed at 02/09/2022 0500 ?Gross per 24 hour  ?Intake 456.55 ml  ?Output 685 ml  ?Net -228.45 ml  ? ? ? ?Physical Exam ? ?Awake Alert, Oriented X 3, No new F.N deficits, frail ?Symmetrical Chest wall movement, Good air movement bilaterally, CTAB ?RRR,No Gallops,Rubs or new Murmurs, No Parasternal Heave ?+ve B.Sounds, Abd Soft, No tenderness, No rebound - guarding or rigidity. ?No Cyanosis, Clubbing or edema, No new Rash or bruise   ? ? ? Data Review:  ? ? ?CBC ?Recent Labs  ?Lab 02/07/22 ?1913 02/08/22 ?5993 02/08/22 ?1149 02/09/22 ?0123  ?WBC 4.8 5.0 4.0 5.5  ?HGB 10.8* 10.7* 10.4* 10.3*  ?HCT 33.4* 33.3* 31.9* 31.7*  ?PLT 148* 147* 137* 153  ?MCV 93.6 92.8 94.1 92.7  ?MCH 30.3 29.8 30.7 30.1  ?MCHC 32.3 32.1 32.6 32.5  ?RDW 14.0 14.1 14.0 14.0  ?LYMPHSABS 1.3  --   --  1.4  ?MONOABS 0.4  --   --  0.5  ?EOSABS 0.2  --   --  0.2  ?BASOSABS 0.0  --   --  0.0  ? ? ?Electrolytes ?Recent Labs  ?Lab 02/07/22 ?1913 02/08/22 ?5701 02/08/22 ?1149 02/08/22 ?1811 02/09/22 ?0123  ?NA 138 139  --   --  143  ?K 4.1 3.9  --   --  3.8  ?CL 103 101  --   --  109  ?CO2 27 28  --   --  28  ?GLUCOSE 285* 217*  --   --  173*  ?BUN 32* 25*  --   --  21  ?CREATININE 1.63* 1.57*  --   --  1.43*  ?CALCIUM 8.6* 8.5*  --   --  8.6*  ?AST  --   --   --   --  25  ?ALT  --   --   --   --  23  ?ALKPHOS  --   --   --   --  77  ?BILITOT  --   --   --   --  0.6  ?ALBUMIN  --   --   --   --  2.6*  ?MG  --   --   --   --  2.2  ?CRP  --   --  0.9  --  1.0*  ?PROCALCITON  --   --   --  <0.10 <0.10  ?INR 1.1  --   --   --   --   ?HGBA1C  --  10.2*  --   --   --   ?BNP 175.1*  --   --   --  356.8*  ? ? ?------------------------------------------------------------------------------------------------------------------ ?No results for input(s): CHOL, HDL, LDLCALC, TRIG, CHOLHDL, LDLDIRECT in the last 72 hours. ? ?Lab Results  ?Component  Value Date  ? HGBA1C 10.2 (H) 02/08/2022  ? ? ?No results for input(s): TSH, T4TOTAL, T3FREE, THYROIDAB in  the last 72 hours. ? ?Invalid input(s): FREET3 ?------------------------------------------------------------------------------------------------------------------ ?ID Labs ?Recent Labs  ?Lab 02/07/22 ?1913 02/08/22 ?2902

## 2022-02-09 NOTE — Anesthesia Preprocedure Evaluation (Deleted)
Anesthesia Evaluation  ? ? ?Reviewed: ?Allergy & Precautions, Patient's Chart, lab work & pertinent test results, Unable to perform ROS - Chart review only ? ?Airway ? ? ? ? ? ? ? Dental ?  ?Pulmonary ?pneumonia,  ?  ? ? ? ? ? ? ? Cardiovascular ?hypertension, +CHF  ?+ dysrhythmias + pacemaker  ? ?Echo 05/2020 ?1. Left ventricular ejection fraction, by estimation, is 30%. The left ventricle has moderate to severely decreased function. The left ventricle demonstrates global hypokinesis with septal-lateral dyssynchrony. There is  ?severe left ventricular hypertrophy. Left ventricular diastolic parameters are consistent with Grade I diastolic dysfunction (impaired relaxation).  ??2. Right ventricular systolic function is mildly reduced. The right ventricular size is normal. Tricuspid regurgitation signal is inadequate for assessing PA pressure.  ??3. Left atrial size was severely dilated.  ??4. The mitral valve is normal in structure. Trivial mitral valve regurgitation. No evidence of mitral stenosis.  ??5. The aortic valve is tricuspid. Aortic valve regurgitation is not visualized. Mild aortic valve sclerosis is present, with no evidence of aortic valve stenosis.  ??6. Aortic dilatation noted. There is mild dilatation of the aortic root measuring 39 mm.  ??7. The inferior vena cava is dilated in size with <50% respiratory variability, suggesting right atrial pressure of 15 mmHg.  ?  ?Neuro/Psych ? Neuromuscular disease CVA   ? GI/Hepatic ?Neg liver ROS, GERD  ,  ?Endo/Other  ?diabetes ? Renal/GU ?Renal disease  ? ?  ?Musculoskeletal ? ?(+) Arthritis ,  ? Abdominal ?  ?Peds ? Hematology ? ?(+) Blood dyscrasia, anemia ,   ?Anesthesia Other Findings ? ? Reproductive/Obstetrics ? ?  ? ? ? ? ? ? ? ? ? ? ? ? ? ?  ?  ? ? ? ? ? ? ? ? ?Anesthesia Physical ?Anesthesia Plan ? ?ASA: 4 ? ?Anesthesia Plan: MAC  ? ?Post-op Pain Management: Minimal or no pain anticipated  ? ?Induction:  Intravenous ? ?PONV Risk Score and Plan: 2 and Propofol infusion, TIVA and Treatment may vary due to age or medical condition ? ?Airway Management Planned: Natural Airway ? ?Additional Equipment:  ? ?Intra-op Plan:  ? ?Post-operative Plan:  ? ?Informed Consent:  ? ?Plan Discussed with:  ? ?Anesthesia Plan Comments: (Patient given granola bar with barium study at ~1030. Cancelled due to non-NPO)  ? ? ? ? ? ?Anesthesia Quick Evaluation ? ?

## 2022-02-09 NOTE — Progress Notes (Signed)
Pt presented to endo today for EGD with MD Benson Norway.  ? ?Upon pre-operative assessment, pt states she was given food by SLP. Note from SLP confirms that pt was fed 75% of a nutrigrain bar at 1000 this morning. ? ?MDA Germ[place] and MD Benson Norway notified. Due to pt not being NPO, case postponed. ? ?Debarah Crape, RN ?02/09/22 ?12:41 PM ? ?

## 2022-02-09 NOTE — Evaluation (Signed)
Occupational Therapy Evaluation ?Patient Details ?Name: [name] ?MRN: [id number] ?DOB: 04/07/1933 ?Today's Date: 02/09/2022 ? ? ?History of Present Illness Pt is a 88 y/o female presenting on 4/17 from SNF with cough, SOB, and dark stools. Found with PNA and possible GI bleed.  PMH includes: arthrits, anemia, CA, cataract, CKD, CVA, MD, DVT, HTN, LBBB, obestiy, pacemaker.  ? ?Clinical Impression ?  ?Pt completes transfers to the [facility] at min to mod assist currently with min to mod as well for LB selfcare sit to stand.  HR elevated up into the upper 120's with activity with O2 sats remaining greater than 94% on room air throughout session.  Pt reports being modified independent at the SNF from her wheelchair so feel she will benefit from acute care OT to help progress back to supervision/modified independent level in simulated environment.    ?   ? ?Recommendations for follow up therapy are one component of a multi-disciplinary discharge planning process, led by the attending physician.  Recommendations may be updated based on patient status, additional functional criteria and insurance authorization.  ? ?Follow Up Recommendations ? Skilled nursing-short term rehab (<3 hours/day)  ?  ?Assistance Recommended at Discharge Frequent or constant Supervision/Assistance  ?Patient can return home with the following A little help with walking and/or transfers;A little help with bathing/dressing/bathroom;Assistance with cooking/housework ? ?  ?Functional Status Assessment ? Patient has had a recent decline in their functional status and demonstrates the ability to make significant improvements in function in a reasonable and predictable amount of time.  ?Equipment Recommendations ? None recommended by OT  ?  ?   ?Precautions / Restrictions Precautions ?Precautions: Fall ?Precaution Comments: hx of L CVA with right hemiparesis  ? ?  ? ?Mobility Bed Mobility ?Overal bed mobility: Needs Assistance ?Bed Mobility: Supine to Sit ?   ?  ?Supine to sit: Min assist (HOB elevated to approximately 30 degrees.) ?  ?  ?  ?  ? ?Transfers ?Overall transfer level: Needs assistance ?Equipment used: None ?Transfers: Sit to/from Stand, Bed to chair/wheelchair/BSC ?Sit to Stand: Min assist ?  ?  ?Step pivot transfers: Mod assist ?  ?  ?General transfer comment: Pt used therapist for UE support as well as reaching for the arm of the 3:1 and arm of the bedside recliner. ?  ? ?  ?Balance Overall balance assessment: Needs assistance ?Sitting-balance support: No upper extremity supported, Feet supported ?Sitting balance-Leahy Scale: Good ?  ?  ?Standing balance support: During functional activity, Reliant on assistive device for balance ?Standing balance-Leahy Scale: Poor ?  ?  ?  ?  ?  ?  ?  ?  ?  ?  ?  ?  ?   ? ?ADL either performed or assessed with clinical judgement  ? ?ADL Overall ADL's : Needs assistance/impaired ?Eating/Feeding: Independent ?  ?Grooming: Wash/dry hands;Wash/dry face;Set up;Sitting ?  ?Upper Body Bathing: Sitting;Supervision/ safety ?  ?Lower Body Bathing: Minimal assistance;Sit to/from stand ?Lower Body Bathing Details (indicate cue type and reason): simulated ?Upper Body Dressing : Minimal assistance;Sitting ?Upper Body Dressing Details (indicate cue type and reason): simulated ?Lower Body Dressing: Minimal assistance;Sit to/from stand ?  ?Toilet Transfer: Stand-pivot;Moderate assistance ?  ?Toileting- Clothing Manipulation and Hygiene: Moderate assistance;Sit to/from stand ?  ?  ?  ?Functional mobility during ADLs: Moderate assistance (stand pivot to the bed and bedside commode only.  Pt does not walk at the SNF.) ?General ADL Comments: Pt reports using her wheelchair for mobility at the SNF  and compelting all of her ADLs, toileting, and showering without assistance.  She does exhibit recent left shoulder weakness and pain in the LUE which was not present as severly before.  ? ? ? ?Vision Baseline Vision/History: 4 Cataracts (Pt with  left cataract causing blurry vision.  She wears glasses all the time but does not have them with her currently.) ?Ability to See in Adequate Light: 1 Impaired ?Patient Visual Report: No change from baseline ?Vision Assessment?: No apparent visual deficits  ?   ?Perception Perception ?Perception: Within Functional Limits ?  ?Praxis Praxis ?Praxis: Intact ?  ? ?Pertinent Vitals/Pain Pain Assessment ?Pain Assessment: Faces ?Faces Pain Scale: No hurt  ? ? ? ?Hand Dominance Right (Uses her left hand more since CVA a few years ago.) ?  ?Extremity/Trunk Assessment Upper Extremity Assessment ?Upper Extremity Assessment: RUE deficits/detail;LUE deficits/detail ?RUE Deficits / Details: history of hemiparesis as well as shoulder crepitus.  AAROM shoulder flexion 0-90 degrees, AROM 0-50 degrees, end range pain.  Brunnstrum stage IV movement in the elbow with isolated digit movement and grip strength at 3+/5. ?RUE Sensation: WNL ?RUE Coordination: decreased gross motor ?LUE Deficits / Details: History of rotator cuff injury with crepitus noted during shoulder flexion/abduction.  AROM 0-110 degrees,  all other joints AROM WFLs with strength at 3+/5 throughout. ?LUE Sensation: WNL ?LUE Coordination: decreased gross motor ?  ?Lower Extremity Assessment ?Lower Extremity Assessment: Defer to PT evaluation ?  ?Cervical / Trunk Assessment ?Cervical / Trunk Assessment: Kyphotic ?  ?Communication Communication ?Communication: No difficulties ?  ?Cognition Arousal/Alertness: Awake/alert ?Behavior During Therapy: [facility] for tasks assessed/performed ?Overall Cognitive Status: Within Functional Limits for tasks assessed ?  ?  ?  ?  ?  ?  ?  ?  ?  ?  ?  ?  ?  ?  ?  ?  ?  ?  ?  ?   ?   ?   ? ? ?Home Living Family/patient expects to be discharged to:: Skilled nursing facility ?  ?  ?  ?  ?  ?  ?  ?  ?  ?  ?  ?  ?  ?  ?  ?  ?Additional Comments: Pt reports using her wheelchari for mobility and being able to transfer to the bed, shower seat, 3:1  without assist.  She was bathing and dressing on her own as well per her report. ?  ? ?  ?Prior Functioning/Environment Prior Level of Function : Independent/Modified Independent ?  ?  ?  ?  ?  ?  ?  ?  ?  ? ?  ?  ?OT Problem List: Decreased strength;Decreased range of motion;Impaired balance (sitting and/or standing);Decreased knowledge of use of DME or AE ?  ?   ?OT Treatment/Interventions: Self-care/ADL training;Therapeutic exercise;Patient/family education;Neuromuscular education;Balance training;Therapeutic activities;DME and/or AE instruction  ?  ?OT Goals(Current goals can be found in the care plan section) Acute Rehab OT Goals ?Patient Stated Goal: Pt did not state, but agreeable to participate in therapy. ?OT Goal Formulation: With patient ?Time For Goal Achievement: 02/23/22 ?Potential to Achieve Goals: Good  ?OT Frequency: Min 2X/week ?  ? ?   ?AM-PAC OT "6 Clicks" Daily Activity     ?Outcome Measure Help from another person eating meals?: None ?Help from another person taking care of personal grooming?: A Little ?Help from another person toileting, which includes using toliet, bedpan, or urinal?: A Lot ?Help from another person bathing (including washing, rinsing, drying)?: A Lot ?Help from  another person to put on and taking off regular upper body clothing?: A Little ?Help from another person to put on and taking off regular lower body clothing?: A Lot ?6 Click Score: 16 ?  ?End of Session Nurse Communication: Mobility status ? ?Activity Tolerance: Patient tolerated treatment well ?Patient left: in chair;with call bell/phone within reach ? ?OT Visit Diagnosis: Unsteadiness on feet (R26.81);Muscle weakness (generalized) (M62.81)  ?              ?Time: 2951-8841 ?OT Time Calculation (min): 47 min ?Charges:  OT General Charges ?$OT Visit: 1 Visit ?OT Evaluation ?$OT Eval Moderate Complexity: 1 Mod ?OT Treatments ?$Self Care/Home Management : 23-37 mins ? ?Kathyann Spaugh OTR/L ?02/09/2022, 11:07 AM ?

## 2022-02-09 NOTE — Plan of Care (Signed)

## 2022-02-09 NOTE — Progress Notes (Signed)
?  Transition of Care (TOC) Screening Note ? ? ?Patient Details  ?Name: [name] ?Date of Birth: 1933/02/06 ? ? ?Transition of Care (TOC) CM/SW Contact:    ?Benard Halsted, LCSW ?Phone Number: ?02/09/2022, 9:38 AM ? ? ? ?Transition of Care Department [facility]) has reviewed patient. Patient resides under long term care at [facility] SN[facility]. We will continue to monitor patient advancement through interdisciplinary progression rounds. If new patient transition needs arise, please place a TOC consult. ? ? ?

## 2022-02-10 ENCOUNTER — Inpatient Hospital Stay (HOSPITAL_COMMUNITY): Payer: Medicare Other | Admitting: Certified Registered Nurse Anesthetist

## 2022-02-10 ENCOUNTER — Encounter (HOSPITAL_COMMUNITY): Payer: Self-pay | Admitting: Internal Medicine

## 2022-02-10 ENCOUNTER — Encounter (HOSPITAL_COMMUNITY)
Admission: EM | Disposition: A | Discharge status: Skilled Nursing Facility | Payer: Self-pay | Source: Home / Self Care | Attending: Internal Medicine

## 2022-02-10 DIAGNOSIS — R195 Other fecal abnormalities: Secondary | ICD-10-CM | POA: Diagnosis not present

## 2022-02-10 DIAGNOSIS — N1832 Chronic kidney disease, stage 3b: Secondary | ICD-10-CM

## 2022-02-10 DIAGNOSIS — J690 Pneumonitis due to inhalation of food and vomit: Secondary | ICD-10-CM | POA: Diagnosis not present

## 2022-02-10 DIAGNOSIS — L8990 Pressure ulcer of unspecified site, unspecified stage: Secondary | ICD-10-CM | POA: Insufficient documentation

## 2022-02-10 DIAGNOSIS — K317 Polyp of stomach and duodenum: Secondary | ICD-10-CM

## 2022-02-10 DIAGNOSIS — K921 Melena: Secondary | ICD-10-CM

## 2022-02-10 DIAGNOSIS — I5042 Chronic combined systolic (congestive) and diastolic (congestive) heart failure: Secondary | ICD-10-CM

## 2022-02-10 DIAGNOSIS — I130 Hypertensive heart and chronic kidney disease with heart failure and stage 1 through stage 4 chronic kidney disease, or unspecified chronic kidney disease: Secondary | ICD-10-CM

## 2022-02-10 HISTORY — PX: ESOPHAGOGASTRODUODENOSCOPY (EGD) WITH PROPOFOL: SHX5813

## 2022-02-10 LAB — COMPREHENSIVE METABOLIC PANEL
ALT: 24 U/L (ref 0–44)
AST: 23 U/L (ref 15–41)
Albumin: 2.7 g/dL — ABNORMAL LOW (ref 3.5–5.0)
Alkaline Phosphatase: 75 U/L (ref 38–126)
Anion gap: 6 (ref 5–15)
BUN: 14 mg/dL (ref 8–23)
CO2: 26 mmol/L (ref 22–32)
Calcium: 8.9 mg/dL (ref 8.9–10.3)
Chloride: 110 mmol/L (ref 98–111)
Creatinine, Ser: 1.27 mg/dL — ABNORMAL HIGH (ref 0.44–1.00)
GFR, Estimated: 41 mL/min — ABNORMAL LOW (ref >60)
Glucose, Bld: 164 mg/dL — ABNORMAL HIGH (ref 70–99)
Potassium: 3.6 mmol/L (ref 3.5–5.1)
Sodium: 142 mmol/L (ref 135–145)
Total Bilirubin: 0.7 mg/dL (ref 0.3–1.2)
Total Protein: 6.3 g/dL — ABNORMAL LOW (ref 6.5–8.1)

## 2022-02-10 LAB — CBC WITH DIFFERENTIAL/PLATELET
Abs Immature Granulocytes: 0.03 10*3/uL (ref 0.00–0.07)
Basophils Absolute: 0 10*3/uL (ref 0.0–0.1)
Basophils Relative: 1 %
Eosinophils Absolute: 0.2 10*3/uL (ref 0.0–0.5)
Eosinophils Relative: 5 %
HCT: 32.8 % — ABNORMAL LOW (ref 36.0–46.0)
Hemoglobin: 10.4 g/dL — ABNORMAL LOW (ref 12.0–15.0)
Immature Granulocytes: 1 %
Lymphocytes Relative: 21 %
Lymphs Abs: 1 10*3/uL (ref 0.7–4.0)
MCH: 29.8 pg (ref 26.0–34.0)
MCHC: 31.7 g/dL (ref 30.0–36.0)
MCV: 94 fL (ref 80.0–100.0)
Monocytes Absolute: 0.5 10*3/uL (ref 0.1–1.0)
Monocytes Relative: 11 %
Neutro Abs: 2.9 10*3/uL (ref 1.7–7.7)
Neutrophils Relative %: 61 %
Platelets: 156 10*3/uL (ref 150–400)
RBC: 3.49 MIL/uL — ABNORMAL LOW (ref 3.87–5.11)
RDW: 14.1 % (ref 11.5–15.5)
WBC: 4.7 10*3/uL (ref 4.0–10.5)
nRBC: 0 % (ref 0.0–0.2)

## 2022-02-10 LAB — GLUCOSE, CAPILLARY
Glucose-Capillary: 191 mg/dL — ABNORMAL HIGH (ref 70–99)
Glucose-Capillary: 161 mg/dL — ABNORMAL HIGH (ref 70–99)
Glucose-Capillary: 141 mg/dL — ABNORMAL HIGH (ref 70–99)
Glucose-Capillary: 243 mg/dL — ABNORMAL HIGH (ref 70–99)

## 2022-02-10 LAB — C-REACTIVE PROTEIN: CRP: 0.9 mg/dL (ref <1.0)

## 2022-02-10 LAB — MAGNESIUM: Magnesium: 2.4 mg/dL (ref 1.7–2.4)

## 2022-02-10 LAB — BRAIN NATRIURETIC PEPTIDE: B Natriuretic Peptide: 408.1 pg/mL — ABNORMAL HIGH (ref 0.0–100.0)

## 2022-02-10 LAB — PROCALCITONIN: Procalcitonin: <0.1 ng/mL

## 2022-02-10 SURGERY — ESOPHAGOGASTRODUODENOSCOPY (EGD) WITH PROPOFOL
Anesthesia: Monitor Anesthesia Care

## 2022-02-10 MED ORDER — PROPOFOL 500 MG/50ML IV EMUL
INTRAVENOUS | Status: DC | PRN
  Administered 2022-02-10: 75 ug/kg/min via INTRAVENOUS

## 2022-02-10 MED ORDER — SODIUM CHLORIDE 0.9 % IV SOLN
INTRAVENOUS | Status: AC
Start: 2022-02-10 — End: 2022-02-10

## 2022-02-10 MED ORDER — APIXABAN 2.5 MG PO TABS
2.5000 mg | ORAL_TABLET | Freq: Two times a day (BID) | ORAL | Status: DC
  Administered 2022-02-10 – 2022-02-11 (×2): 2.5 mg via ORAL
  Filled 2022-02-10 (×2): qty 1

## 2022-02-10 MED ORDER — PROPOFOL 10 MG/ML IV BOLUS
INTRAVENOUS | Status: DC | PRN
  Administered 2022-02-10 (×2): 20 mg via INTRAVENOUS

## 2022-02-10 MED ORDER — SODIUM CHLORIDE 0.9 % IV SOLN: INTRAVENOUS | Status: DC

## 2022-02-10 SURGICAL SUPPLY — 15 items

## 2022-02-10 NOTE — Anesthesia Postprocedure Evaluation (Signed)
Anesthesia Post Note ? ?Patient: MITSUKO LUERA ? ?Procedure(s) Performed: ESOPHAGOGASTRODUODENOSCOPY (EGD) WITH PROPOFOL ? ?  ? ?Patient location during evaluation: PACU ?Anesthesia Type: MAC ?Level of consciousness: awake and alert ?Pain management: pain level controlled ?Vital Signs Assessment: post-procedure vital signs reviewed and stable ?Respiratory status: spontaneous breathing, nonlabored ventilation and respiratory function stable ?Cardiovascular status: stable and blood pressure returned to baseline ?Anesthetic complications: no ? ? ?No notable events documented. ? ?Last Vitals:  ?Vitals:  ? 02/10/22 1431 02/10/22 1441  ?BP: (!) 151/75 (!) 149/72  ?Pulse: 60 60  ?Resp: 13 18  ?Temp:    ?SpO2: 100% 98%  ?  ?Last Pain:  ?Vitals:  ? 02/10/22 1441  ?TempSrc:   ?PainSc: 0-No pain  ? ? ?  ?  ?  ?  ?  ?  ? ?Audry Pili ? ? ? ? ?

## 2022-02-10 NOTE — Progress Notes (Signed)
?                                  PROGRESS NOTE                                             ?                                                                                                                     ?                                         ? ? Patient Demographics:  ? ? [name], is a 88 y.o. female, DOB - [birth year], SLH:[id number] ? ?Outpatient Primary MD for the patient is Pcp, No    LOS - 2  Admit date - 02/07/2022   ? ?Chief Complaint  ?Patient presents with  ? Weakness  ?    ? ?Brief Narrative (HPI from H&P)     ? ?88 y.o. female with medical history significant of chronic combined CHF, CKD stage IIIb, CVA, insulin-dependent type 2 diabetes, recurrent DVT on Eliquis, hypertension, SVT/atrial tachycardia status post PPM, breast cancer on antiestrogen therapy presented to the ED with complaints of cough, shortness of breath, dark tarry stools, and fatigue.  Work-up was consistent with pneumonia and possible upper GI bleed. ? ? Subjective:  ? ? [name] today is any diarrhea, nausea, vomiting, reports bowel movement overnight, not dark in color. . ? ? Assessment  & Plan :  ? ?Pneumonia.  ?- Continue with Unasyn ?-SLP input appreciated, low risk for aspiration. ? ? ?2. 8 mm nodular density in the right upper lobe  ?- outpatient age-appropriate PCP follow-up and monitoring. ? ?Dark stool.   ?-GI input greatly appreciated, initial plan for endoscopy has been postponed yesterday as patient  had Nutrigrain bar during her MBS study today, EGD has been postponed till t afternoon, hemoglobin is stable ? ?Chronic systolic and diastolic heart failure EF 30%.   ?-Appears euvolemic.  Since n.p.o. gentle IV fluids once eating and drinking we will discontinue IV fluids.  Resume home dose beta-blocker, resume diuretics once oral intake has stabilized. ? ?CKD 3B.   ?-Creatinine of 1.6 appears to be baseline. ? ?History of SVT, atrial tachycardia.  Tachybradycardia syndrome.   ?- S/p  pacemaker.  Continue beta-blocker and amiodarone combination.  Currently no chest pain, palpitation or cardiac symptoms at all. ? ?Recurrent DVTs.  Eliquis on hold due to  above.  Resume Eliquis if work-up is negative ? ?History of breast cancer.  On antiestrogen therapy.  Resume after discharge and follow-up with primary oncologist ? ?Dyslipidemia.  On statin. ? ?DM type II.  Half  home dose of Lantus along with every 6 sliding scale as she is n.p.o. ? ?Lab Results  ?Component Value Date  ? HGBA1C 10.2 (H) 02/08/2022  ? ?CBG (last 3)  ?Recent Labs  ?  02/09/22 ?2106 02/10/22 ?1610 02/10/22 ?1156  ?GLUCAP 260* 191* 161*  ? ? ? ? ?   ? ?Condition - Extremely Guarded ? ?Family Communication  :  None at bedside ? ?Code Status :  Full ? ?Consults  :  None ? ?PUD Prophylaxis : PPI ? ? Procedures  :    ? ?CT - Cardiovascular: Heart is mildly enlarged. Pacemaker is present. There is no pericardial effusion. Aorta is nondilated. Mediastinum/Nodes: No enlarged mediastinal or axillary lymph nodes. Thyroid gland, trachea, and esophagus demonstrate no significant findings. Lungs/Pleura: Patchy multifocal ground-glass and airspace opacities are seen in the bilateral lower lobes, right greater than left. There is some atelectasis in lingula. There are minimal tree-in-bud opacities and ground-glass opacities in the inferior left upper lobe. No pleural effusion or pneumothorax identified. There is a 8 mm nodular density in the right upper lobe image 4/57. Upper Abdomen: No acute abnormality. Musculoskeletal: There is no acute fracture or dislocation. Multilevel degenerative changes affect the spine. Degenerative changes also affect both shoulders.  ? ?   ? ?Disposition Plan  :   ? ?Status is: inpatient ? ?DVT Prophylaxis  :   ? ? ?Lab Results  ?Component Value Date  ? PLT 156 02/10/2022  ? ? ?Diet :  ?Diet Order   ? ?       ?  Diet NPO time specified  Diet effective now       ?  ? ?  ?  ? ?  ?  ? ?Inpatient Medications ? ?Scheduled  Meds: ? [MAR Hold] amiodarone  200 mg Oral Once per day on Mon Tue Wed Thu Fri Sat  ? [MAR Hold] atorvastatin  10 mg Oral QHS  ? [MAR Hold] folic acid  0.5 mg Oral q morning  ? [MAR Hold] guaiFENesin  600 mg Oral BID  ? [MAR Hold] insulin aspart  0-9 Units Subcutaneous TID AC & HS  ? [MAR Hold] insulin glargine-yfgn  8 Units Subcutaneous QHS  ? [MAR Hold] metoprolol succinate  25 mg Oral Daily  ? [MAR Hold] pantoprazole (PROTONIX) IV  40 mg Intravenous Q12H  ? [MAR Hold] vitamin B-12  500 mcg Oral q morning  ? ?Continuous Infusions: ? sodium chloride    ? sodium chloride 50 mL/hr at 02/10/22 0941  ? [MAR Hold] ampicillin-sulbactam (UNASYN) IV 3 g (02/10/22 0505)  ? ?PRN Meds:.[MAR Hold] acetaminophen **OR** [MAR Hold] acetaminophen, [MAR Hold] guaiFENesin-dextromethorphan ? ?Antibiotics  :   ? ?Anti-infectives (From admission, onward)  ? ? Start     Dose/Rate Route Frequency Ordered Stop  ? 02/10/22 0600  levofloxacin (LEVAQUIN) IVPB 750 mg  Status:  Discontinued       ? 750 mg ?100 mL/hr over 90 Minutes Intravenous Every 48 hours 02/08/22 0444 02/08/22 0500  ? 02/08/22 0600  [MAR Hold]  Ampicillin-Sulbactam (UNASYN) 3 g in sodium chloride 0.9 % 100 mL IVPB        (MAR Hold since Thu 02/10/2022 at 1316.Hold Reason: Transfer to a Procedural area)  ? 3 g ?200 mL/hr over 30 Minutes Intravenous Every 12 hours 02/08/22 0511    ? 02/08/22 0430  levofloxacin (LEVAQUIN) IVPB 750 mg  Status:  Discontinued       ? 750 mg ?100 mL/hr over 90  Minutes Intravenous  Once 02/08/22 0427 02/08/22 0500  ? ?  ? ? ? ? ?Phillips Climes M.D on 02/10/2022 at 1:57 PM ? ?To page go to [email or web address]  ? ?[facility] -  Office  [phone] ? ?See all Orders from today for further details ? ? ? Objective:  ? ?Vitals:  ? 02/10/22 0835 02/10/22 0945 02/10/22 1157 02/10/22 1318  ?BP: (!) 161/65 (!) 173/76 (!) 154/76 (!) 180/70  ?Pulse: 61 61 60 60  ?Resp: '12 17 20 15  '$ ?Temp: 97.8 ?F (36.6 ?C) 97.8 ?F (36.6 ?C) 98 ?F (36.7 ?C) (!) 97.2 ?F  (36.2 ?C)  ?TempSrc: Oral Oral Oral Temporal  ?SpO2: 98% 98% 96% 98%  ?Weight:      ?Height:      ? ? ?Wt Readings from Last 3 Encounters:  ?02/08/22 86.4 kg  ?12/10/21 86 kg  ?11/15/21 88.9 kg  ? ? ? ?Intake/Output Summary (Last 24 hours) at 02/10/2022 1357 ?Last data filed at 02/10/2022 1055 ?Gross per 24 hour  ?Intake 100 ml  ?Output 600 ml  ?Net -500 ml  ? ? ? ? ?Physical Exam ? ?Awake Alert, Oriented X 3, No new F.N deficits, frail ?Symmetrical Chest wall movement, Good air movement bilaterally, CTAB ?RRR,No Gallops,Rubs or new Murmurs, No Parasternal Heave ?+ve B.Sounds, Abd Soft, No tenderness, No rebound - guarding or rigidity. ?No Cyanosis, Clubbing or edema, No new Rash or bruise   ? ? ? ? Data Review:  ? ? ?CBC ?Recent Labs  ?Lab 02/07/22 ?1913 02/08/22 ?4503 02/08/22 ?1149 02/09/22 ?0123 02/10/22 ?8882  ?WBC 4.8 5.0 4.0 5.5 4.7  ?HGB 10.8* 10.7* 10.4* 10.3* 10.4*  ?HCT 33.4* 33.3* 31.9* 31.7* 32.8*  ?PLT 148* 147* 137* 153 156  ?MCV 93.6 92.8 94.1 92.7 94.0  ?MCH 30.3 29.8 30.7 30.1 29.8  ?MCHC 32.3 32.1 32.6 32.5 31.7  ?RDW 14.0 14.1 14.0 14.0 14.1  ?LYMPHSABS 1.3  --   --  1.4 1.0  ?MONOABS 0.4  --   --  0.5 0.5  ?EOSABS 0.2  --   --  0.2 0.2  ?BASOSABS 0.0  --   --  0.0 0.0  ? ? ? ?Electrolytes ?Recent Labs  ?Lab 02/07/22 ?1913 02/08/22 ?8003 02/08/22 ?1149 02/08/22 ?1811 02/09/22 ?0123 02/10/22 ?4917  ?NA 138 139  --   --  143 142  ?K 4.1 3.9  --   --  3.8 3.6  ?CL 103 101  --   --  109 110  ?CO2 27 28  --   --  28 26  ?GLUCOSE 285* 217*  --   --  173* 164*  ?BUN 32* 25*  --   --  21 14  ?CREATININE 1.63* 1.57*  --   --  1.43* 1.27*  ?CALCIUM 8.6* 8.5*  --   --  8.6* 8.9  ?AST  --   --   --   --  25 23  ?ALT  --   --   --   --  23 24  ?ALKPHOS  --   --   --   --  91 50  ?BILITOT  --   --   --   --  0.6 0.7  ?ALBUMIN  --   --   --   --  2.6* 2.7*  ?MG  --   --   --   --  2.2 2.4  ?CRP  --   --  0.9  --  1.0* 0.9  ?PROCALCITON  --   --   --  <  0.10 <0.10 <0.10  ?INR 1.1  --   --   --   --   --   ?HGBA1C   --  10.2*  --   --   --   --   ?BNP 175.1*  --   --   --  356.8* 408.1*  ? ? ? ?------------------------------------------------------------------------------------------------------------------ ?No resu

## 2022-02-10 NOTE — Anesthesia Procedure Notes (Signed)
Procedure Name: Long Neck ?Date/Time: 02/10/2022 2:05 PM ?Performed by: Carolan Clines, CRNA ?Pre-anesthesia Checklist: Patient identified, Emergency Drugs available, Suction available and Patient being monitored ?Patient Re-evaluated:Patient Re-evaluated prior to induction ?Oxygen Delivery Method: Nasal cannula ?Dental Injury: Teeth and Oropharynx as per pre-operative assessment  ? ? ? ? ?

## 2022-02-10 NOTE — Interval H&P Note (Signed)
History and Physical Interval Note: ? ?02/10/2022 ?2:01 PM ? ?[name]  has presented today for surgery, with the diagnosis of Melena and anemia.  The various methods of treatment have been discussed with the patient and family. After consideration of risks, benefits and other options for treatment, the patient has consented to  Procedure(s): ?ESOPHAGOGASTRODUODENOSCOPY (EGD) WITH PROPOFOL (N/A) as a surgical intervention.  The patient's history has been reviewed, patient examined, no change in status, stable for surgery.  I have reviewed the patient's chart and labs.  Questions were answered to the patient's satisfaction.   ? ? ?Yonatan Guitron D ? ? ?

## 2022-02-10 NOTE — Op Note (Signed)
[facility] ?Patient Name: [name] ?Procedure Date : 02/10/2022 ?MRN: [id number] ?Attending MD: Carol Ada , MD ?Date of Birth: 1932-12-30 ?CSN: [id number] ?Age: 88 ?Admit Type: Inpatient ?Procedure:                Upper GI endoscopy ?Indications:              Heme positive stool, Melena ?Providers:                Carol Ada, MD, Doristine Johns, RN, Primitivo Gauze  ?                          [place], Technician, [employer],  ?                          Technician ?Referring MD:              ?Medicines:                 ?Complications:            No immediate complications. ?Estimated Blood Loss:     Estimated blood loss: none. ?Procedure:                Pre-Anesthesia Assessment: ?                          - Prior to the procedure, a History and Physical  ?                          was performed, and patient medications and  ?                          allergies were reviewed. The patient's tolerance of  ?                          previous anesthesia was also reviewed. The risks  ?                          and benefits of the procedure and the sedation  ?                          options and risks were discussed with the patient.  ?                          All questions were answered, and informed consent  ?                          was obtained. Prior Anticoagulants: The patient has  ?                          taken Eliquis (apixaban), last dose was 2 days  ?                          prior to procedure. ASA Grade Assessment: IV - A  ?                          patient  with severe systemic disease that is a  ?                          constant threat to life. After reviewing the risks  ?                          and benefits, the patient was deemed in  ?                          satisfactory condition to undergo the procedure. ?                          - Sedation was administered by an anesthesia  ?                          professional. Deep sedation was attained. ?                           After obtaining informed consent, the endoscope was  ?                          passed under direct vision. Throughout the  ?                          procedure, the patient's blood pressure, pulse, and  ?                          oxygen saturations were monitored continuously. The  ?                          GIF-[id number] ([id number]) Olympus endoscope was introduced  ?                          through the mouth, and advanced to the second part  ?                          of duodenum. The upper GI endoscopy was  ?                          accomplished without difficulty. The patient  ?                          tolerated the procedure well. ?Scope In: ?Scope Out: ?Findings: ?     The esophagus was normal. ?     A few small sessile polyps with no bleeding and no stigmata of recent  ?     bleeding were found in the gastric body. ?     The examined duodenum was normal. ?     There was evidence of stasis of fluid in the esophagus. In fact, a small  ?     pill was noted in the distal esophagus and the was suctioned into the  ?     scope. A couple of inflammatory appearing gastric polyps were noted.  ?     These were not bleeding nor were they the source of her  melena. Her HGB  ?     is unchanged since admission and it is at her baseline. Her health is  ?     poor and a colonoscopy is high risk and it is not advised with her  ?     severe comorbidities. ?Impression:               - Normal esophagus. ?                          - A few gastric polyps. ?                          - Normal examined duodenum. ?                          - No specimens collected. ?Recommendation:           - Return patient to hospital ward for ongoing care. ?                          - Resume regular diet. ?                          - Resume Eliquis. ?                          - Follow up with Dr. Collene Mares in 2-3 weeks. ?Procedure Code(s):        --- Professional --- ?                          [phone], Esophagogastroduodenoscopy, flexible,  ?                           transoral; diagnostic, including collection of  ?                          specimen(s) by brushing or washing, when performed  ?                          (separate procedure) ?Diagnosis Code(s):        --- Professional --- ?                          K31.7, Polyp of stomach and duodenum ?                          R19.5, Other fecal abnormalities ?                          K92.1, Melena (includes Hematochezia) ?CPT copyright 2019 [employer]. All rights reserved. ?The codes documented in this report are preliminary and upon coder review may  ?be revised to meet current compliance requirements. ?Carol Ada, MD ?Carol Ada, MD ?02/10/2022 2:23:27 PM ?This report has been signed electronically. ?Number of Addenda: 0 ?

## 2022-02-10 NOTE — Transfer of Care (Signed)
Immediate Anesthesia Transfer of Care Note ? ?Patient: [name] ? ?Procedure(s) Performed: ESOPHAGOGASTRODUODENOSCOPY (EGD) WITH PROPOFOL ? ?Patient Location: Endoscopy Unit ? ?Anesthesia Type:MAC ? ?Level of Consciousness: drowsy ? ?Airway & Oxygen Therapy: Patient Spontanous Breathing ? ?Post-op Assessment: Report given to RN and Post -op Vital signs reviewed and stable ? ?Post vital signs: Reviewed and stable ? ?Last Vitals:  ?Vitals Value Taken Time  ?BP    ?Temp    ?Pulse 60 02/10/22 1420  ?Resp 15 02/10/22 1420  ?SpO2 95 % 02/10/22 1420  ?Vitals shown include unvalidated device data. ? ?Last Pain:  ?Vitals:  ? 02/10/22 1318  ?TempSrc: Temporal  ?PainSc: 3   ?   ? ?  ? ?Complications: No notable events documented. ?

## 2022-02-10 NOTE — Plan of Care (Signed)

## 2022-02-10 NOTE — Progress Notes (Signed)
Inpatient Diabetes Program Recommendations ? ?AACE/ADA: New Consensus Statement on Inpatient Glycemic Control (2015) ? ?Target Ranges:  Prepandial:   less than 140 mg/dL ?     Peak postprandial:   less than 180 mg/dL (1-2 hours) ?     Critically ill patients:  140 - 180 mg/dL  ? ?Lab Results  ?Component Value Date  ? GLUCAP 191 (H) 02/10/2022  ? HGBA1C 10.2 (H) 02/08/2022  ? ? ?Review of Glycemic Control ? Latest Reference Range & Units 02/09/22 00:26 02/09/22 04:31 02/09/22 07:41 02/09/22 11:48  ?Glucose-Capillary 70 - 99 mg/dL 193 (H) 159 (H) 134 (H) 257 (H)  ? ?Diabetes history: Type 2 DM ?Outpatient Diabetes medications: Trulicity 1.5 mg qwk, Lantus 20 units QHS ?Current orders for Inpatient glycemic control: Semglee 8 units QHS, Novolog 0-9 units tid + hs ? ?Spoke with pt at bedside regarding A1c level. Per pt report, the facility she was at had stopped her insulin and she is not sure why. But she noticed her glucose trends increase into the 200-300 range. Pt reports she sees Dr. Chalmers Cater, Endocrinologist outpt.  ? ?Pt will need to resume insulin dose at facility. ? ?Thanks, ?Tama Headings RN, MSN, BC-ADM ?Inpatient Diabetes Coordinator ?Team Pager [phone] (8a-5p) ?

## 2022-02-10 NOTE — Anesthesia Preprocedure Evaluation (Addendum)
Anesthesia Evaluation  ?Patient identified by MRN, date of birth, ID band ?Patient awake ? ? ? ?Reviewed: ?Allergy & Precautions, NPO status , Patient's Chart, lab work & pertinent test results, reviewed documented beta blocker date and time  ? ?History of Anesthesia Complications ?Negative for: history of anesthetic complications ? ?Airway ?Mallampati: III ? ?TM Distance: >3 FB ?Neck ROM: Full ? ? ? Dental ? ?(+) Edentulous Upper, Edentulous Lower ?  ?Pulmonary ?neg pulmonary ROS,  ?  ?Pulmonary exam normal ? ? ? ? ? ? ? Cardiovascular ?hypertension, Pt. on medications and Pt. on home beta blockers ?+CHF and + DVT  ?Normal cardiovascular exam+ dysrhythmias Supra Ventricular Tachycardia + pacemaker + Valvular Problems/Murmurs MR  ? ? ?'21 TTE - EF 30%. Global hypokinesis with septal-lateral dyssynchrony. There is severe left ventricular hypertrophy. ?Grade I diastolic dysfunction (impaired relaxation). Right ventricular systolic function is mildly reduced. Left atrial size was severely dilated. Trivial mitral valve regurgitation. There is mild dilatation of the aortic root measuring 39 mm.  ? ?  ?Neuro/Psych ?CVA, Residual Symptoms negative psych ROS  ? GI/Hepatic ?Neg liver ROS, GERD  Medicated and Controlled,  ?Endo/Other  ?diabetes, Type 2, Insulin Dependent ?Obesity ? ? Renal/GU ?CRFRenal disease  ? ?  ?Musculoskeletal ? ?(+) Arthritis ,  ? Abdominal ?  ?Peds ? Hematology ? ?(+) Blood dyscrasia, anemia ,  ?On eliquis ?   ?Anesthesia Other Findings ? ? Reproductive/Obstetrics ? ?Breast cancer ? ? ?  ? ? ? ? ? ? ? ? ? ? ? ? ? ?  ?  ? ? ? ? ? ? ? ?Anesthesia Physical ?Anesthesia Plan ? ?ASA: 3 ? ?Anesthesia Plan: MAC  ? ?Post-op Pain Management:   ? ?Induction:  ? ?PONV Risk Score and Plan: 2 and Propofol infusion and Treatment may vary due to age or medical condition ? ?Airway Management Planned: Nasal Cannula and Natural Airway ? ?Additional Equipment: None ? ?Intra-op Plan:   ? ?Post-operative Plan:  ? ?Informed Consent: I have reviewed the patients History and Physical, chart, labs and discussed the procedure including the risks, benefits and alternatives for the proposed anesthesia with the patient or authorized representative who has indicated his/her understanding and acceptance.  ? ? ? ? ? ?Plan Discussed with: CRNA and Anesthesiologist ? ?Anesthesia Plan Comments:   ? ? ? ? ? ?Anesthesia Quick Evaluation ? ?

## 2022-02-11 DIAGNOSIS — N1832 Chronic kidney disease, stage 3b: Secondary | ICD-10-CM | POA: Diagnosis not present

## 2022-02-11 DIAGNOSIS — J690 Pneumonitis due to inhalation of food and vomit: Secondary | ICD-10-CM | POA: Diagnosis not present

## 2022-02-11 DIAGNOSIS — I824Z1 Acute embolism and thrombosis of unspecified deep veins of right distal lower extremity: Secondary | ICD-10-CM

## 2022-02-11 LAB — CBC WITH DIFFERENTIAL/PLATELET
Abs Immature Granulocytes: 0.02 10*3/uL (ref 0.00–0.07)
Basophils Absolute: 0 10*3/uL (ref 0.0–0.1)
Basophils Relative: 1 %
Eosinophils Absolute: 0.2 10*3/uL (ref 0.0–0.5)
Eosinophils Relative: 4 %
HCT: 29.3 % — ABNORMAL LOW (ref 36.0–46.0)
Hemoglobin: 9.4 g/dL — ABNORMAL LOW (ref 12.0–15.0)
Immature Granulocytes: 0 %
Lymphocytes Relative: 20 %
Lymphs Abs: 1 10*3/uL (ref 0.7–4.0)
MCH: 30 pg (ref 26.0–34.0)
MCHC: 32.1 g/dL (ref 30.0–36.0)
MCV: 93.6 fL (ref 80.0–100.0)
Monocytes Absolute: 0.5 10*3/uL (ref 0.1–1.0)
Monocytes Relative: 10 %
Neutro Abs: 3.1 10*3/uL (ref 1.7–7.7)
Neutrophils Relative %: 65 %
Platelets: 146 10*3/uL — ABNORMAL LOW (ref 150–400)
RBC: 3.13 MIL/uL — ABNORMAL LOW (ref 3.87–5.11)
RDW: 14.3 % (ref 11.5–15.5)
WBC: 4.8 10*3/uL (ref 4.0–10.5)
nRBC: 0 % (ref 0.0–0.2)

## 2022-02-11 LAB — COMPREHENSIVE METABOLIC PANEL
ALT: 22 U/L (ref 0–44)
AST: 24 U/L (ref 15–41)
Albumin: 2.6 g/dL — ABNORMAL LOW (ref 3.5–5.0)
Alkaline Phosphatase: 70 U/L (ref 38–126)
Anion gap: 7 (ref 5–15)
BUN: 12 mg/dL (ref 8–23)
CO2: 24 mmol/L (ref 22–32)
Calcium: 8.5 mg/dL — ABNORMAL LOW (ref 8.9–10.3)
Chloride: 112 mmol/L — ABNORMAL HIGH (ref 98–111)
Creatinine, Ser: 1.21 mg/dL — ABNORMAL HIGH (ref 0.44–1.00)
GFR, Estimated: 43 mL/min — ABNORMAL LOW (ref >60)
Glucose, Bld: 169 mg/dL — ABNORMAL HIGH (ref 70–99)
Potassium: 3.6 mmol/L (ref 3.5–5.1)
Sodium: 143 mmol/L (ref 135–145)
Total Bilirubin: 0.6 mg/dL (ref 0.3–1.2)
Total Protein: 5.8 g/dL — ABNORMAL LOW (ref 6.5–8.1)

## 2022-02-11 LAB — MAGNESIUM: Magnesium: 2.2 mg/dL (ref 1.7–2.4)

## 2022-02-11 LAB — GLUCOSE, CAPILLARY
Glucose-Capillary: 164 mg/dL — ABNORMAL HIGH (ref 70–99)
Glucose-Capillary: 263 mg/dL — ABNORMAL HIGH (ref 70–99)

## 2022-02-11 SURGERY — EGD (ESOPHAGOGASTRODUODENOSCOPY)
Anesthesia: Monitor Anesthesia Care

## 2022-02-11 MED ORDER — POTASSIUM CHLORIDE ER 10 MEQ PO TBCR: 20.0000 meq | EXTENDED_RELEASE_TABLET | Freq: Every day | ORAL | Status: DC

## 2022-02-11 MED ORDER — AMOXICILLIN-POT CLAVULANATE 875-125 MG PO TABS: 1.0000 | ORAL_TABLET | Freq: Two times a day (BID) | ORAL | 0 refills | Status: AC

## 2022-02-11 MED ORDER — INSULIN GLARGINE 100 UNITS/ML SOLOSTAR PEN: 16.0000 [IU] | PEN_INJECTOR | Freq: Every day | SUBCUTANEOUS | 11 refills | Status: DC

## 2022-02-11 MED ORDER — TORSEMIDE 20 MG PO TABS: 20.0000 mg | ORAL_TABLET | Freq: Two times a day (BID) | ORAL | 0 refills | Status: DC

## 2022-02-11 NOTE — Discharge Instructions (Signed)
Follow with Primary MD /SNF Physician in  in 3 days  ? ?Get CBC, CMP, in 3 days, repeat 2 wies CXR in 2 weeks ? ? ?Activity: As tolerated with Full fall precautions use walker/cane & assistance as needed ? ? ?Disposition Home  ? ? ?Diet: Heart Healthy , with feeding assistance and aspiration precautions. ? ? ? ?On your next visit with your primary care physician please Get Medicines reviewed and adjusted. ? ? ?Please request your Prim.MD to go over all Hospital Tests and Procedure/Radiological results at the follow up, please get all Hospital records sent to your Prim MD by signing hospital release before you go home. ? ? ?If you experience worsening of your admission symptoms, develop shortness of breath, life threatening emergency, suicidal or homicidal thoughts you must seek medical attention immediately by calling 911 or calling your MD immediately  if symptoms less severe. ? ?You Must read complete instructions/literature along with all the possible adverse reactions/side effects for all the Medicines you take and that have been prescribed to you. Take any new Medicines after you have completely understood and accpet all the possible adverse reactions/side effects.  ? ?Do not drive, operating heavy machinery, perform activities at heights, swimming or participation in water activities or provide baby sitting services if your were admitted for syncope or siezures until you have seen by Primary MD or a Neurologist and advised to do so again. ? ?Do not drive when taking Pain medications.  ? ? ?Do not take more than prescribed Pain, Sleep and Anxiety Medications ? ?Special Instructions: If you have smoked or chewed Tobacco  in the last 2 yrs please stop smoking, stop any regular Alcohol  and or any Recreational drug use. ? ?Wear Seat belts while driving. ? ? ?Please note ? ?You were cared for by a hospitalist during your hospital stay. If you have any questions about your discharge medications or the care you  received while you were in the hospital after you are discharged, you can call the unit and asked to speak with the hospitalist on call if the hospitalist that took care of you is not available. Once you are discharged, your primary care physician will handle any further medical issues. Please note that NO REFILLS for any discharge medications will be authorized once you are discharged, as it is imperative that you return to your primary care physician (or establish a relationship with a primary care physician if you do not have one) for your aftercare needs so that they can reassess your need for medications and monitor your lab values.  ?

## 2022-02-11 NOTE — [place] FL2 (Signed)
?[place] MEDICAID FL2 LEVEL OF CARE SCREENING TOOL  ?  ? ?IDENTIFICATION  ?Patient Name: ?[name] Birthdate: [birth year] Sex: female Admission Date (Current Location): ?02/07/2022  ?[place] and [place] Number: ? [place] ?  Facility and Address:  ?[facility], [place] [address], [place], [place] [address] ?     Provider Number: ?[id number]  ?Attending Physician Name and Address:  ?Elgergawy, Silver Huguenin, MD ? Relative Name and Phone Number:  ?Tyler Deis (Daughter) [phone] ?   ?Current Level of Care: ?Hospital Recommended Level of Care: ?[place] Prior Approval Number: ?  ? ?Date Approved/Denied: ?  PASRR Number: ?  ? ?Discharge Plan: ?Hospital ?  ? ?Current Diagnoses: ?Patient Active Problem List  ? Diagnosis Date Noted  ? Pressure injury of skin 02/10/2022  ? Melena 02/08/2022  ? Chest pain 02/08/2022  ? Cough 02/08/2022  ? Diabetic neuropathy ([place]) 12/10/2021  ? Diabetic nephropathy ([place]) 12/10/2021  ? Hyperglycemia due to type 2 diabetes mellitus ([place]) 12/10/2021  ? Pure hypercholesterolemia 12/10/2021  ? Osteoarthritis of knee 12/10/2021  ? Complete heart block ([place]) 05/14/2021  ? Acute on chronic combined systolic and diastolic CHF (congestive heart failure) ([place]) 10/08/2020  ? GERD (gastroesophageal reflux disease) 10/08/2020  ? Declining functional status   ? Goals of care, counseling/discussion   ? Palliative care by specialist   ? Aspiration pneumonia ([place]) 07/13/2020  ? Hypoglycemia 07/13/2020  ? Elevated troponin 07/13/2020  ? Sepsis ([place]) 07/13/2020  ? Syncope and collapse 06/22/2020  ? COVID-19 virus infection 06/22/2020  ? Hypokalemia 06/22/2020  ? Pacemaker 05/06/2020  ? Syncope 08/10/2019  ? Displaced fracture of lateral malleolus of right fibula, initial encounter for closed fracture 07/10/2019  ? Pain in left shoulder 07/10/2019  ? Acute CHF (congestive heart failure) ([place]) 06/09/2019  ? Acute pulmonary edema (HCC)   ? Personal history of breast  cancer 04/22/2019  ? Pressure ulcer 01/04/2019  ? Acute pancreatitis 01/02/2019  ? Malignant neoplasm of lower-inner quadrant of right breast of female, estrogen receptor positive ([place]) 11/23/2018  ? Hematoma of right iliopsoas muscle 11/06/2018  ? Intramuscular hematoma 11/06/2018  ? Hypertensive heart disease with heart failure ([place]) 09/05/2017  ? Aphasia 08/31/2017  ? Dysphagia 08/31/2017  ? CKD (chronic kidney disease), stage III ([place])   ? LBBB (left bundle branch block)   ? Other pancytopenia ([place])   ? HTN (hypertension)   ? Obesity   ? DVT (deep venous thrombosis) ([place])   ? DM (diabetes mellitus) ([place]) 07/28/2015  ? Fall 07/28/2015  ? Warfarin-induced coagulopathy ([place]) 07/28/2015  ? Acute on chronic diastolic CHF (congestive heart failure) ([place]) 03/19/2014  ? Edema 08/06/2013  ? SVT (supraventricular tachycardia) ([place]) 10/15/2012  ? First degree AV block 10/15/2012  ? Acute renal insufficiency 10/14/2012  ? Dizziness and giddiness   ? Thrombocytopenia ([place]) 11/21/2011  ? ? ?Orientation RESPIRATION BLADDER Height & Weight   ?  ?Self, Time, Situation, Place ? Normal Incontinent Weight: 190 lb 7.6 oz (86.4 kg) ?Height:  '5\' 5"'$  (165.1 cm)  ?BEHAVIORAL SYMPTOMS/MOOD NEUROLOGICAL BOWEL NUTRITION STATUS  ?    Continent Diet (See DC summary)  ?AMBULATORY STATUS COMMUNICATION OF NEEDS Skin   ?Extensive Assist Verbally Other (Comment) (Coccyx mid stage 2; shallow open injury with red, pink wound bed without slough) ?  ?  ?  ?    ?     ?     ? ? ?Personal Care Assistance Level of Assistance  ?Bathing, Feeding Bathing  Assistance: Maximum assistance ?Feeding assistance: Limited assistance ?  ?   ? ?Functional Limitations Info  ?Hearing, Sight, Speech Sight Info: Impaired ?Hearing Info: Impaired ?Speech Info: Adequate  ? ? ?SPECIAL CARE FACTORS FREQUENCY  ?OT (By licensed OT)   ?  ?  ?  ?  ?  ?  ?   ? ? ?Contractures Contractures Info: Not present  ? ? ?Additional Factors Info  ?Code Status, Allergies, Insulin Sliding Scale  Code Status Info: Full ?Allergies Info: Aspirin, Penicillins, Remeron (Mirtazapine) ?  ?Insulin Sliding Scale Info: insulin aspart (novoLOG) injection 0-9 Units ?  ?   ? ?Current Medications (02/11/2022):  This is the current hospital active medication list ?Current Facility-Administered Medications  ?Medication Dose Route Frequency Provider Last Rate Last Admin  ? acetaminophen (TYLENOL) tablet 650 mg  650 mg Oral Q6H PRN Shela Leff, MD   650 mg at 02/11/22 0248  ? Or  ? acetaminophen (TYLENOL) suppository 650 mg  650 mg Rectal Q6H PRN Shela Leff, MD      ? amiodarone (PACERONE) tablet 200 mg  200 mg Oral Once per day on Mon Tue Wed Thu Fri Sat Thurnell Lose, MD   200 mg at 02/11/22 5329  ? Ampicillin-Sulbactam (UNASYN) 3 g in sodium chloride 0.9 % 100 mL IVPB  3 g Intravenous Q12H Laren Everts, RPH 200 mL/hr at 02/11/22 0507 3 g at 02/11/22 0507  ? apixaban (ELIQUIS) tablet 2.5 mg  2.5 mg Oral BID Elgergawy, Silver Huguenin, MD   2.5 mg at 02/11/22 9242  ? atorvastatin (LIPITOR) tablet 10 mg  10 mg Oral QHS Thurnell Lose, MD   10 mg at 02/10/22 2141  ? folic acid (FOLVITE) tablet 0.5 mg  0.5 mg Oral q morning Thurnell Lose, MD   0.5 mg at 02/11/22 0849  ? guaiFENesin (MUCINEX) 12 hr tablet 600 mg  600 mg Oral BID Shela Leff, MD   600 mg at 02/11/22 6834  ? guaiFENesin-dextromethorphan (ROBITUSSIN DM) 100-10 MG/5ML syrup 5 mL  5 mL Oral Q4H PRN Etta Quill, DO   5 mL at 02/08/22 2140  ? insulin aspart (novoLOG) injection 0-9 Units  0-9 Units Subcutaneous TID AC & HS Elgergawy, Silver Huguenin, MD   2 Units at 02/11/22 0848  ? insulin glargine-yfgn (SEMGLEE) injection 8 Units  8 Units Subcutaneous QHS Onnie Boer Q, RPH-CPP   8 Units at 02/10/22 2142  ? metoprolol succinate (TOPROL-XL) 24 hr tablet 25 mg  25 mg Oral Daily Thurnell Lose, MD   25 mg at 02/11/22 1962  ? pantoprazole (PROTONIX) injection 40 mg  40 mg Intravenous Q12H Shela Leff, MD   40 mg at 02/11/22 0837  ?  vitamin B-12 (CYANOCOBALAMIN) tablet 500 mcg  500 mcg Oral q morning Thurnell Lose, MD   500 mcg at 02/11/22 2297  ? ? ? ?Discharge Medications: ?Please see discharge summary for a list of discharge medications. ? ?Relevant Imaging Results: ? ?Relevant Lab Results: ? ? ?Additional Information ?[phone].No immunizations on file. ? ?Benard Halsted, LCSW ? ? ? ? ?

## 2022-02-11 NOTE — Care Management Important Message (Signed)
Important Message ? ?Patient Details  ?Name: [name] ?MRN: [id number] ?Date of Birth: 1933-08-23 ? ? ?Medicare Important Message Given:  Yes ? ? ? ? ?[name] ?02/11/2022, 3:17 PM ?

## 2022-02-11 NOTE — Discharge Summary (Signed)
Physician Discharge Summary  ?[name] LEX:[id number] DOB: 30-Jun-1933 DOA: 02/07/2022 ? ?PCP: Pcp, No ? ?Admit date: 02/07/2022 ?Discharge date: 02/11/2022 ? ?Admitted From: SNF ?Disposition:  SNF ? ?Recommendations for Outpatient Follow-up:  ?To follow with her primary GI Dr. Collene Mares in 2 weeks ?Patient will need noncontrast CT chest in 6 to 12 months to follow an 8 mm right solid pulmonary nodule within the upper lobe ?Please obtain BMP/CBC in one week ? ? ?Discharge Condition:Stable ?CODE STATUS:FULL ?Diet recommendation: Heart Healthy / Carb Modified ? ? ? ?Brief/Interim Summary: ? ?88 y.o. female with medical history significant of chronic combined CHF, CKD stage IIIb, CVA, insulin-dependent type 2 diabetes, recurrent DVT on Eliquis, hypertension, SVT/atrial tachycardia status post PPM, breast cancer on antiestrogen therapy presented to the ED with complaints of cough, shortness of breath, dark tarry stools, and fatigue.  Work-up was consistent with pneumonia and possible upper GI bleed. ? ?Pneumonia ?-Evidence of bibasilar pneumonia on admission on CT chest, concern for aspiration of pneumonia, she was treated with IV Unasyn during hospital stay, evaluated by SLP, she was noted to be low aspiration risk. ?-She is to continue with oral Augmentin as an outpatient for another 3 days. ?  ?8 mm nodular density in the right upper lobe  ?- outpatient age-appropriate PCP follow-up and monitoring. ?-We will need repeat CT chest in 6 to 12 months. ?  ?Dark stool.   ?-GI input greatly appreciated, initial plan for endoscopy has been postponed  as patient  had Nutrigrain bar during her MBS study today, EGD has been postponed till 4/20, it was performed by Dr. Carlean Jews, there is no evidence of any lesions or evidence of active GI bleed, patient is high risk for colonoscopy, so it was deferred this admission especially her hemoglobin remained stable, and no evidence of GI bleed.. ?-Eliquis has been held on admission, it was  discharged resumed yesterday after her hemoglobin has been stable no acute findings and EGD.   ? ?Chronic systolic and diastolic heart failure EF 30%.   ?-Appears euvolemic.  Diuresis has been held during hospital stay. ?-We will resume her torsemide on discharge to continue with losartan and beta-blockers, she is on Farxiga as well.  ?  ?CKD 3B.   ?-Creatinine of 1.6 appears to be baseline. ?  ?History of SVT, atrial tachycardia.  Tachybradycardia syndrome.   ?- S/p pacemaker.  Continue beta-blocker and amiodarone combination.  Currently no chest pain, palpitation or cardiac symptoms at all. ?  ?Recurrent DVTs.  Resume held initially, currently back on Eliquis. ?  ?History of breast cancer.  On antiestrogen therapy.   ?  ?Dyslipidemia.  On statin. ?  ?DM type II.  Resume home medications ?  ?  ? ?Discharge Diagnoses:  ?Principal Problem: ?  Aspiration pneumonia ([place]) ?Active Problems: ?  SVT (supraventricular tachycardia) (HCC) ?  CKD (chronic kidney disease), stage III ([place]) ?  HTN (hypertension) ?  DVT (deep venous thrombosis) ([place]) ?  DM (diabetes mellitus) ([place]) ?  Hypertensive heart disease with heart failure ([place]) ?  Malignant neoplasm of lower-inner quadrant of right breast of female, estrogen receptor positive ([place]) ?  Melena ?  Chest pain ?  Cough ?  Pressure injury of skin ? ? ? ?Discharge Instructions ? ?Discharge Instructions   ? ? Diet - low sodium heart healthy   Complete by: As directed ?  ? Discharge instructions   Complete by: As directed ?  ? Follow with Primary MD /SNF Physician in  in 3 days  ? ?Get CBC, CMP, in 3 days, repeat 2 wies CXR in 2 weeks ? ? ?Activity: As tolerated with Full fall precautions use walker/cane & assistance as needed ? ? ?Disposition Home  ? ? ?Diet: Heart Healthy , carb modified with feeding assistance and aspiration precautions. ? ? ? ?On your next visit with your primary care physician please Get Medicines reviewed and adjusted. ? ? ?Please request your Prim.MD to go  over all Hospital Tests and Procedure/Radiological results at the follow up, please get all Hospital records sent to your Prim MD by signing hospital release before you go home. ? ? ?If you experience worsening of your admission symptoms, develop shortness of breath, life threatening emergency, suicidal or homicidal thoughts you must seek medical attention immediately by calling 911 or calling your MD immediately  if symptoms less severe. ? ?You Must read complete instructions/literature along with all the possible adverse reactions/side effects for all the Medicines you take and that have been prescribed to you. Take any new Medicines after you have completely understood and accpet all the possible adverse reactions/side effects.  ? ?Do not drive, operating heavy machinery, perform activities at heights, swimming or participation in water activities or provide baby sitting services if your were admitted for syncope or siezures until you have seen by Primary MD or a Neurologist and advised to do so again. ? ?Do not drive when taking Pain medications.  ? ? ?Do not take more than prescribed Pain, Sleep and Anxiety Medications ? ?Special Instructions: If you have smoked or chewed Tobacco  in the last 2 yrs please stop smoking, stop any regular Alcohol  and or any Recreational drug use. ? ?Wear Seat belts while driving. ? ? ?Please note ? ?You were cared for by a hospitalist during your hospital stay. If you have any questions about your discharge medications or the care you received while you were in the hospital after you are discharged, you can call the unit and asked to speak with the hospitalist on call if the hospitalist that took care of you is not available. Once you are discharged, your primary care physician will handle any further medical issues. Please note that NO REFILLS for any discharge medications will be authorized once you are discharged, as it is imperative that you return to your primary care  physician (or establish a relationship with a primary care physician if you do not have one) for your aftercare needs so that they can reassess your need for medications and monitor your lab values.  ? Increase activity slowly   Complete by: As directed ?  ? No wound care   Complete by: As directed ?  ? ?  ? ?Allergies as of 02/11/2022   ? ?   Reactions  ? Aspirin Itching  ? Penicillins Itching, Rash  ? DID THE REACTION INVOLVE: Swelling of the face/tongue/throat, SOB, or low BP? Yes ?Sudden or severe rash/hives, skin peeling, or the inside of the mouth or nose? No ?Did it require medical treatment? Yes ?When did it last happen? "More than 10 years ago" ?If all above answers are "NO", may proceed with cephalosporin use. ?Tolerated full course of Unasyn in 2021.  ? Remeron [mirtazapine] Other (See Comments)  ? Unknown reaction  ? ?  ? ?  ?Medication List  ?  ? ?STOP taking these medications   ? ?azithromycin 250 MG tablet ?Commonly known as: ZITHROMAX ?  ?cefTRIAXone 2 g injection ?Commonly known as: ROCEPHIN ?  ? ?  ? ?  TAKE these medications   ? ?acetaminophen 650 MG CR tablet ?Commonly known as: TYLENOL ?Take 650 mg by mouth every 8 (eight) hours. ?  ?amiodarone 200 MG tablet ?Commonly known as: PACERONE ?Take one tablet by mouth daily Monday-Saturday. Do NOT take Sunday ?What changed:  ?how much to take ?how to take this ?when to take this ?additional instructions ?  ?amoxicillin-clavulanate 875-125 MG tablet ?Commonly known as: Augmentin ?Take 1 tablet by mouth 2 (two) times daily for 3 days. Take for 3 days ?Start taking on: February 12, 2022 ?  ?anastrozole 1 MG tablet ?Commonly known as: ARIMIDEX ?TAKE 1 TABLET(1 MG) BY MOUTH DAILY ?What changed:  ?how much to take ?how to take this ?when to take this ?additional instructions ?  ?atorvastatin 10 MG tablet ?Commonly known as: LIPITOR ?Take 10 mg by mouth at bedtime. ?What changed: Another medication with the same name was removed. Continue taking this medication,  and follow the directions you see here. ?  ?Biofreeze 4 % Gel ?Generic drug: Menthol (Topical Analgesic) ?Apply 1 application. topically 3 (three) times daily. For shoulder pain ?  ?cetirizine 10 MG tablet ?Commonl

## 2022-02-11 NOTE — TOC Initial Note (Signed)
Transition of Care (TOC) - Initial/Assessment Note  ? ? ?Patient Details  ?Name: [name] ?MRN: [id number] ?Date of Birth: 02-Apr-1933 ? ?Transition of Care (TOC) CM/SW Contact:    ?Benard Halsted, LCSW ?Phone Number: ?02/11/2022, 12:09 PM ? ?Clinical Narrative:                 ?Patient ready to discharge back to [place] today. CSW notified patient's daughter.  ? ?Expected Discharge Plan: [place] ?Barriers to Discharge: No Barriers Identified ? ? ?Patient Goals and CMS Choice ?Patient states their goals for this hospitalization and ongoing recovery are:: Return to snf ?CMS Medicare.gov Compare Post Acute Care list provided to:: Patient ?Choice offered to / list presented to : Patient, Adult Children ? ?Expected Discharge Plan and Services ?Expected Discharge Plan: [place] ?In-house Referral: Clinical Social Work ?  ?Post Acute Care Choice: [place] ?Living arrangements for the past 2 months: [place] ?Expected Discharge Date: 02/11/22               ?  ?  ?  ?  ?  ?  ?  ?  ?  ?  ? ?Prior Living Arrangements/Services ?Living arrangements for the past 2 months: [place] ?Lives with:: Facility Resident ?Patient language and need for interpreter reviewed:: Yes ?Do you feel safe going back to the place where you live?: Yes      ?Need for Family Participation in Patient Care: Yes (Comment) ?Care giver support system in place?: Yes (comment) ?  ?Criminal Activity/Legal Involvement Pertinent to Current Situation/Hospitalization: No - Comment as needed ? ?Activities of Daily Living ?Home Assistive Devices/Equipment: Wheelchair ?ADL Screening (condition at time of admission) ?Patient's cognitive ability adequate to safely complete daily activities?: Yes ?Is the patient deaf or have difficulty hearing?: Yes ?Does the patient have difficulty seeing, even when wearing glasses/contacts?: No ?Does the patient have difficulty concentrating,  remembering, or making decisions?: No ?Patient able to express need for assistance with ADLs?: Yes ?Does the patient have difficulty dressing or bathing?: Yes ?Independently performs ADLs?: No ?Communication: Independent ?Dressing (OT): Dependent ?Is this a change from baseline?: Pre-admission baseline ?Grooming: Dependent ?Is this a change from baseline?: Pre-admission baseline ?Feeding: Independent ?Is this a change from baseline?: Pre-admission baseline ?Bathing: Dependent ?Is this a change from baseline?: Pre-admission baseline ?Toileting: Dependent ?Is this a change from baseline?: Pre-admission baseline ?In/Out Bed: Dependent ?Is this a change from baseline?: Pre-admission baseline ?Walks in Home: Dependent ?Is this a change from baseline?: Pre-admission baseline ?Does the patient have difficulty walking or climbing stairs?: Yes ?Weakness of Legs: Both ?Weakness of Arms/Hands: Both ? ?Permission Sought/Granted ?Permission sought to share information with : Facility Sport and exercise psychologist, Family Supports ?Permission granted to share information with : Yes, Verbal Permission Granted ? Share Information with NAME: [name] ? Permission granted to share info w AGENC[facility]: [facility] ? Permission granted to share info w Relationship: Daughter ? Permission granted to share info w Contact Information: [phone] ? ?Emotional Assessment ?Appearance:: Appears stated age ?Attitude/Demeanor/Rapport: Gracious ?Affect (typically observed): Accepting, Appropriate ?Orientation: : Oriented to Self, Oriented to Place, Oriented to  Time, Oriented to Situation (some confusion) ?Alcohol / Substance Use: Not Applicable ?Psych Involvement: No (comment) ? ?Admission diagnosis:  Cough [R05.9] ?Dark stools [R19.5] ?Productive cough [R05.8] ?Patient Active Problem List  ? Diagnosis Date Noted  ? Pressure injury of skin 02/10/2022  ? Melena 02/08/2022  ? Chest pain 02/08/2022  ? Cough 02/08/2022  ?  Diabetic neuropathy ([place])  12/10/2021  ? Diabetic nephropathy ([place]) 12/10/2021  ? Hyperglycemia due to type 2 diabetes mellitus ([place]) 12/10/2021  ? Pure hypercholesterolemia 12/10/2021  ? Osteoarthritis of knee 12/10/2021  ? Complete heart block ([place]) 05/14/2021  ? Acute on chronic combined systolic and diastolic CHF (congestive heart failure) ([place]) 10/08/2020  ? GERD (gastroesophageal reflux disease) 10/08/2020  ? Declining functional status   ? Goals of care, counseling/discussion   ? Palliative care by specialist   ? Aspiration pneumonia ([place]) 07/13/2020  ? Hypoglycemia 07/13/2020  ? Elevated troponin 07/13/2020  ? Sepsis ([place]) 07/13/2020  ? Syncope and collapse 06/22/2020  ? COVID-19 virus infection 06/22/2020  ? Hypokalemia 06/22/2020  ? Pacemaker 05/06/2020  ? Syncope 08/10/2019  ? Displaced fracture of lateral malleolus of right fibula, initial encounter for closed fracture 07/10/2019  ? Pain in left shoulder 07/10/2019  ? Acute CHF (congestive heart failure) ([place]) 06/09/2019  ? Acute pulmonary edema (HCC)   ? Personal history of breast cancer 04/22/2019  ? Pressure ulcer 01/04/2019  ? Acute pancreatitis 01/02/2019  ? Malignant neoplasm of lower-inner quadrant of right breast of female, estrogen receptor positive ([place]) 11/23/2018  ? Hematoma of right iliopsoas muscle 11/06/2018  ? Intramuscular hematoma 11/06/2018  ? Hypertensive heart disease with heart failure ([place]) 09/05/2017  ? Aphasia 08/31/2017  ? Dysphagia 08/31/2017  ? CKD (chronic kidney disease), stage III ([place])   ? LBBB (left bundle branch block)   ? Other pancytopenia ([place])   ? HTN (hypertension)   ? Obesity   ? DVT (deep venous thrombosis) ([place])   ? DM (diabetes mellitus) ([place]) 07/28/2015  ? Fall 07/28/2015  ? Warfarin-induced coagulopathy ([place]) 07/28/2015  ? Acute on chronic diastolic CHF (congestive heart failure) ([place]) 03/19/2014  ? Edema 08/06/2013  ? SVT (supraventricular tachycardia) ([place]) 10/15/2012  ? First degree AV block 10/15/2012  ? Acute renal insufficiency  10/14/2012  ? Dizziness and giddiness   ? Thrombocytopenia ([place]) 11/21/2011  ? ?PCP:  Pcp, No ?Pharmacy:  No Pharmacies Listed ? ? ? ?Social Determinants of Health (SDOH) Interventions ?  ? ?Readmission Risk Interventions ?   ? View : No data to display.  ?  ?  ?  ? ? ? ?

## 2022-02-11 NOTE — TOC Transition Note (Signed)
Transition of Care (TOC) - CM/SW Discharge Note ? ? ?Patient Details  ?Name: [name] ?MRN: [id number] ?Date of Birth: 01-27-1933 ? ?Transition of Care (TOC) CM/SW Contact:  ?Benard Halsted, LCSW ?Phone Number: ?02/11/2022, 12:11 PM ? ? ?Clinical Narrative:    ?[facility] ?Anticipated DC date: 02/11/22 ?Family notified: Daughter, [name] ?Transport by: Corey Harold ? ? ?Per MD patient ready for DC to [facility]. RN to call report prior to discharge [phone] room 209). RN, patient, patient's family, and facility notified of DC. Discharge Summary and FL2 sent to facility. DC packet on chart. Ambulance transport requested for patient.  ? ?CSW will sign off for now as social work intervention is no longer needed. Please consult [place] again if new needs arise. ? ? ? ? ?Final next level of care: [place] ?Barriers to Discharge: No Barriers Identified ? ? ?Patient Goals and CMS Choice ?Patient states their goals for this hospitalization and ongoing recovery are:: Return to snf ?CMS Medicare.gov Compare Post Acute Care list provided to:: Patient ?Choice offered to / list presented to : Patient, Adult Children ? ?Discharge Placement ?  ?Existing PASRR number confirmed : 02/11/22          ?[facility] ?Patient to be transferred to facility by: [facility] ?Name of family member notified: Daughter ?Patient and family notified of of transfer: 02/11/22 ? ?Discharge Plan and Services ?In-house Referral: Clinical Social Work ?  ?Post Acute Care Choice: [place]          ?  ?  ?  ?  ?  ?  ?  ?  ?  ?  ? ?Social Determinants of Health (SDOH) Interventions ?  ? ? ?Readmission Risk Interventions ?   ? View : No data to display.  ?  ?  ?  ? ? ? ? ? ?

## 2022-02-13 LAB — CULTURE, BLOOD (ROUTINE X 2)
Culture: NO GROWTH
Culture: NO GROWTH
Special Requests: ADEQUATE
Special Requests: ADEQUATE

## 2022-02-15 ENCOUNTER — Encounter (HOSPITAL_COMMUNITY): Payer: Self-pay | Admitting: Gastroenterology

## 2022-02-15 ENCOUNTER — Telehealth: Payer: Self-pay | Admitting: *Deleted

## 2022-02-15 NOTE — Telephone Encounter (Signed)
Received call fro [place], NP with [employer] [phone]) stating pt was recently hospitalized for pneumonia and was found to have a new lung nodule.  States she reviewed scans with pt family who states they were not aware of this while pt was hospitalized.  States pt family is requesting MD f/u to review scans and discuss with pt in more detail.  Appt scheduled and Tiffany states she will set up transportation from [place] home.  ?

## 2022-02-21 ENCOUNTER — Other Ambulatory Visit: Payer: Self-pay

## 2022-02-21 ENCOUNTER — Inpatient Hospital Stay: Attending: Hematology and Oncology | Admitting: Hematology and Oncology

## 2022-02-21 DIAGNOSIS — R911 Solitary pulmonary nodule: Secondary | ICD-10-CM | POA: Insufficient documentation

## 2022-02-21 DIAGNOSIS — C50311 Malignant neoplasm of lower-inner quadrant of right female breast: Secondary | ICD-10-CM | POA: Diagnosis present

## 2022-02-21 DIAGNOSIS — Z79899 Other long term (current) drug therapy: Secondary | ICD-10-CM | POA: Diagnosis not present

## 2022-02-21 DIAGNOSIS — Z170 Estrogen receptor positive status [ER+]: Secondary | ICD-10-CM

## 2022-02-21 DIAGNOSIS — Z7901 Long term (current) use of anticoagulants: Secondary | ICD-10-CM | POA: Diagnosis not present

## 2022-02-21 DIAGNOSIS — Z79811 Long term (current) use of aromatase inhibitors: Secondary | ICD-10-CM | POA: Diagnosis not present

## 2022-02-21 NOTE — Progress Notes (Signed)
? ?Patient Care Team: ?Pcp, No as PCP - General ?Jettie Booze, MD as PCP - Cardiology (Cardiology) ?Excell Seltzer, MD (Inactive) as Consulting Physician (General Surgery) ?Nicholas Lose, MD as Consulting Physician (Hematology and Oncology) ?Kyung Rudd, MD as Consulting Physician (Radiation Oncology) ? ?DIAGNOSIS:  ?Encounter Diagnosis  ?Name Primary?  ? Malignant neoplasm of lower-inner quadrant of right breast of female, estrogen receptor positive ([place])   ? ? ?SUMMARY OF ONCOLOGIC HISTORY: ?Oncology History  ?Malignant neoplasm of lower-inner quadrant of right breast of female, estrogen receptor positive ([place])  ?11/19/2018 Initial Diagnosis  ? Palpable right breast mass with calcifications, mammogram revealed focal asymmetry lower right breast 5:00 measuring 4.6 cm, ultrasound revealed overall size of 5.7 cm ultrasound-guided biopsy revealed grade 2 IDC ER 90% PR 80% Ki-67 5%, HER-2 negative, T3N0 stage IIa clinical stage ? ?  ?11/28/2018 Cancer Staging  ? Staging form: Breast, AJCC 8th Edition ?- Clinical: Stage IIA (cT3, cN0, cM0, G2, ER+, PR+, HER2-) - Signed by Nicholas Lose, MD on 11/28/2018 ? ?  ?11/28/2018 -  Neo-Adjuvant Anti-estrogen oral therapy  ? Anastrozole daily, no surgery planned due to comorbidities ?  ? ? ?CHIEF COMPLIANT: estrogen receptor positive. Lung nodule. ? ?INTERVAL HISTORY: [name] is a 88 y.o. with the above mention estrogen positive and lung nodule.She state that she went to the hospital for shortens of breath.  And was diagnosed with pneumonia.  She was treated with antibiotics and she was noted to have multiple problems including heart disease, kidney disease and lung disease.  She was put on 24-hour oxygen.  She was discharged home with hospice care.  Family was asking as questions related to what to do with anastrozole therapy as well as whether hospice care is the right approach.  In the hospital that a CT chest which showed a 8 mm lung nodule which is nonspecific in  nature. ? ?ALLERGIES:  is allergic to aspirin, penicillins, and remeron [mirtazapine]. ? ?MEDICATIONS:  ?Current Outpatient Medications  ?Medication Sig Dispense Refill  ? acetaminophen (TYLENOL) 650 MG CR tablet Take 650 mg by mouth every 8 (eight) hours.    ? amiodarone (PACERONE) 200 MG tablet Take one tablet by mouth daily Monday-Saturday. Do NOT take Sunday (Patient taking differently: Take 200 mg by mouth See admin instructions. Take one tablet (200 mg)  by mouth daily Monday-Saturday. Do NOT take Sunday) 90 tablet 3  ? anastrozole (ARIMIDEX) 1 MG tablet TAKE 1 TABLET(1 MG) BY MOUTH DAILY (Patient taking differently: Take 1 mg by mouth every morning.) 90 tablet 3  ? atorvastatin (LIPITOR) 10 MG tablet Take 10 mg by mouth at bedtime.    ? cetirizine (ZYRTEC) 10 MG tablet Take 10 mg by mouth every morning.    ? Docusate Sodium 100 MG capsule Take 100 mg by mouth every morning.    ? Dulaglutide (TRULICITY) 1.5 OT/1.5BW SOPN Inject 1.5 mg into the skin every Thursday.    ? ELIQUIS 2.5 MG TABS tablet TAKE 1 TABLET(2.5 MG) BY MOUTH TWICE DAILY (Patient taking differently: Take 2.5 mg by mouth 2 (two) times daily.) 60 tablet 2  ? ferrous sulfate 325 (65 FE) MG tablet Take 325 mg by mouth 3 (three) times daily with meals.    ? folic acid (FOLVITE) 620 MCG tablet Take 400 mcg by mouth every morning.    ? insulin glargine (LANTUS) 100 unit/mL SOPN Inject 16 Units into the skin at bedtime. 15 mL 11  ? lisinopril (ZESTRIL) 5 MG tablet Take  1 tablet (5 mg total) by mouth daily. (Patient taking differently: Take 5 mg by mouth every morning.) 30 tablet 0  ? Menthol, Topical Analgesic, (BIOFREEZE) 4 % GEL Apply 1 application. topically 3 (three) times daily. For shoulder pain    ? metoprolol succinate (TOPROL-XL) 25 MG 24 hr tablet Take 1 tablet (25 mg total) by mouth daily. (Patient taking differently: Take 25 mg by mouth every morning.) 30 tablet 0  ? omeprazole (PRILOSEC OTC) 20 MG tablet Take 20 mg by mouth every morning.     ? oxybutynin (DITROPAN) 5 MG tablet TAKE 1 TABLET(5 MG) BY MOUTH TWICE DAILY (Patient taking differently: Take 5 mg by mouth 2 (two) times daily.) 180 tablet 1  ? polyethylene glycol (MIRALAX / GLYCOLAX) 17 g packet Take 17 g by mouth every morning.    ? potassium chloride (KLOR-CON) 10 MEQ tablet Take 2 tablets (20 mEq total) by mouth daily.    ? pregabalin (LYRICA) 75 MG capsule Take 1 capsule (75 mg total) by mouth at bedtime. 90 capsule 1  ? torsemide (DEMADEX) 20 MG tablet Take 1 tablet (20 mg total) by mouth 2 (two) times daily. You may take an extra 20 mg tablet daily as needed for swelling 30 tablet 0  ? vitamin B-12 (CYANOCOBALAMIN) 500 MCG tablet Take 500 mcg by mouth every morning.    ? vitamin C (ASCORBIC ACID) 500 MG tablet Take 500 mg by mouth 3 (three) times daily.    ? ?No current facility-administered medications for this visit.  ? ? ?PHYSICAL EXAMINATION: ?ECOG PERFORMANCE STATUS: 3 - Symptomatic, >50% confined to bed ? ?Vitals:  ? 02/21/22 1320  ?BP: (!) 142/68  ?Pulse: (!) 120  ?Resp: 18  ?Temp: (!) 97.2 ?F (36.2 ?C)  ?SpO2: 100%  ? ?Filed Weights  ? ?  ? ?LABORATORY DATA:  ?I have reviewed the data as listed ? ?  Latest Ref Rng & Units 02/11/2022  ? 12:44 AM 02/10/2022  ? 12:35 AM 02/09/2022  ?  1:23 AM  ?CMP  ?Glucose 70 - 99 mg/dL 169   164   173    ?BUN 8 - 23 mg/dL _0 ?Creatinine 0.44 - 1.00 mg/dL 1.21   1.27   1.43    ?Sodium 135 - 145 mmol/L 143   142   143    ?Potassium 3.5 - 5.1 mmol/L 3.6   3.6   3.8    ?Chloride 98 - 111 mmol/L 112   110   109    ?CO2 22 - 32 mmol/L _1 ?Calcium 8.9 - 10.3 mg/dL 8.5   8.9   8.6    ?Total Protein 6.5 - 8.1 g/dL 5.8   6.3   6.1    ?Total Bilirubin 0.3 - 1.2 mg/dL 0.6   0.7   0.6    ?Alkaline Phos 38 - 126 U/L 70   75   77    ?AST 15 - 41 U/L _2 ?ALT 0 - 44 U/L _3 ? ? ?Lab Results  ?Component Value Date  ? WBC 4.8 02/11/2022  ? HGB 9.4 (L) 02/11/2022  ? HCT 29.3 (L) 02/11/2022  ? MCV 93.6 02/11/2022  ?  PLT 146 (L) 02/11/2022  ? NEUTROABS 3.1 02/11/2022  ? ? ?ASSESSMENT & PLAN:  ?Malignant neoplasm of  lower-inner quadrant of right breast of female, estrogen receptor positive ([place]) ?11/19/2018:Palpable right breast mass with calcifications, mammogram revealed focal asymmetry lower right breast 5:00 measuring 4.6 cm, ultrasound revealed overall size of 5.7 cm ultrasound-guided biopsy revealed grade 2 IDC ER 90% PR 80% Ki-67 5%, HER-2 negative, T3N0 stage IIa clinical stage ?  ?Treatment plan: ?1.  Neoadjuvant antiestrogen therapy with anastrozole started 11/28/2018 ?2. not planning on surgery because of her health and co morbidities ? ? ?Breast cancer surveillance: ?1.  Mammogram 10/19/2021: No evidence of progression of the right breast cancer. ? ?Multiple health issues including heart disease, chronic kidney disease and lung disease: Patient was placed on hospice care after she was discharged from the hospital recently.  Hospice was questioning the role of anastrozole. ? ?Since she is not hospice care, I recommend discontinuation of anastrozole therapy.  I discussed with the family that even though the anastrozole has been stopped the benefits of anastrozole in the last several years and therefore it can be discontinued at this time. ?Patient's energy levels have declined significantly.  She is not eating much food.  Water intake is also diminished. ? ?Return to clinic on an as-needed basis ? ? ?No orders of the defined types were placed in this encounter. ? ?The patient has a good understanding of the overall plan. she agrees with it. she will call with any problems that may develop before the next visit here. ?Total time spent: 30 mins including face to face time and time spent for planning, charting and co-ordination of care ? ? Harriette Ohara, MD ?02/21/22 ? ? ? I Gardiner Coins am scribing for Dr. Lindi Adie ? ?I have reviewed the above documentation for accuracy and completeness, and I agree with the above. ?

## 2022-02-21 NOTE — Assessment & Plan Note (Addendum)
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 ?Treatment plan: ?1.??Neoadjuvant antiestrogen therapy with anastrozole?started 11/28/2018 ?2.?not planning on surgery because of her health and co morbidities ? ? ?Breast cancer surveillance: ?1.  Mammogram 10/19/2021: No evidence of progression of the right breast cancer. ? ?Multiple health issues including heart disease, chronic kidney disease and lung disease: Patient was placed on hospice care after she was discharged from the hospital recently.  Hospice was questioning the role of anastrozole. ? ?Since she is not hospice care, I recommend discontinuation of anastrozole therapy.  I discussed with the family that even though the anastrozole has been stopped the benefits of anastrozole in the last several years and therefore it can be discontinued at this time. ?Patient's energy levels have declined significantly.  She is not eating much food.  Water intake is also diminished. ? ?Return to clinic on an as-needed basis ?

## 2022-02-21 NOTE — Progress Notes (Signed)
Remote pacemaker transmission.   

## 2022-04-20 ENCOUNTER — Other Ambulatory Visit: Payer: Medicare Other

## 2022-05-05 ENCOUNTER — Ambulatory Visit (INDEPENDENT_AMBULATORY_CARE_PROVIDER_SITE_OTHER)

## 2022-05-05 DIAGNOSIS — Z950 Presence of cardiac pacemaker: Secondary | ICD-10-CM | POA: Diagnosis not present

## 2022-05-05 LAB — CUP PACEART REMOTE DEVICE CHECK
Date Time Interrogation Session: 20230713084105
Implantable Lead Implant Date: 20200624
Implantable Lead Implant Date: 20200624
Implantable Lead Location: 753859
Implantable Lead Location: 753860
Implantable Lead Model: 377
Implantable Lead Model: 377
Implantable Lead Serial Number: 81195510
Implantable Pulse Generator Implant Date: 20200624
Pulse Gen Model: 407145
Pulse Gen Serial Number: 69638782

## 2022-05-09 ENCOUNTER — Telehealth: Payer: Self-pay | Admitting: Adult Health

## 2022-05-09 NOTE — Telephone Encounter (Signed)
Rescheduled appointment per provider PAL. Left message. Patient will be mailed an updated calendar.

## 2022-05-20 NOTE — Progress Notes (Signed)
Remote pacemaker transmission.   

## 2022-05-27 ENCOUNTER — Inpatient Hospital Stay: Admission: RE | Admit: 2022-05-27 | Payer: Medicare Other | Source: Ambulatory Visit

## 2022-06-13 ENCOUNTER — Ambulatory Visit: Payer: Medicare Other | Admitting: Adult Health

## 2022-06-13 NOTE — Progress Notes (Signed)
Electrophysiology Office Note Date: 06/21/2022  ID:  Aziyah, Provencal December 27, 1932, MRN [id number]  PCP: Pcp, No Primary Cardiologist: Larae Grooms, MD Electrophysiologist: Cristopher Peru, MD   CC: Pacemaker follow-up  [name] is a 88 y.o. female seen today for Cristopher Peru, MD for routine electrophysiology followup.  Since last being seen in our clinic the patient reports doing well from a cardiac perspective.  she denies exertional chest pain, palpitations, dyspnea, PND, orthopnea, nausea, vomiting, dizziness, syncope, edema, weight gain, or early satiety.  Device History: Biotronik Dual Chamber PPM implanted 03/2019 for CHB  Past Medical History:  Diagnosis Date   Abnormal echocardiogram    a. possible mass on echo 05/2019 on PPM, b. no evidence of mass / atrial lead vegetation on follow up echo 06/2019   Anemia    Arthritis    Breast cancer ([place]) 10/2018   Invasive ductal carcinoma   Cancer (HCC)    Cataract    Chronic combined systolic and diastolic CHF (congestive heart failure) ([place])    a. Previously diastolic but patient AVS from outside hospital listed systolic CHF and cardiomyopathy, records pending.   CKD (chronic kidney disease), stage III (HCC)    Clotting disorder (HCC)    CVA (cerebral vascular accident) ([place])    residual right sided weakness and mild dysphagia   Diabetes mellitus ([place])    Dizziness and giddiness    DVT (deep venous thrombosis) ([place])    a. on anticoagulation for this.   Essential hypertension    LBBB (left bundle branch block)    Mitral regurgitation    a. Mod MR by echo 2014.   Muscular deconditioning    Obesity    Pacemaker    Pericardial effusion    SVT (supraventricular tachycardia) ([place])    a. In 2013 she had an EPS with ablation for SVT which did not eliminate the SVT completely. She also had bradycardia which limited medication. She was placed on amiodarone by Dr. Lovena Le.   Thrombocytopenia ([place]) 11/21/2011   Past Surgical  History:  Procedure Laterality Date   BREAST BIOPSY Right 11/19/2018   positive   ESOPHAGOGASTRODUODENOSCOPY (EGD) WITH PROPOFOL N/A 02/10/2022   Procedure: ESOPHAGOGASTRODUODENOSCOPY (EGD) WITH PROPOFOL;  Surgeon: Carol Ada, MD;  Location: [facility];  Service: Gastroenterology;  Laterality: N/A;   EYE SURGERY     SUPRAVENTRICULAR TACHYCARDIA ABLATION N/A 10/11/2012   Procedure: SUPRAVENTRICULAR TACHYCARDIA ABLATION;  Surgeon: Evans Lance, MD;  Location: [facility] CATH LAB;  Service: Cardiovascular;  Laterality: N/A;    Current Outpatient Medications  Medication Sig Dispense Refill   amiodarone (PACERONE) 200 MG tablet Take one tablet by mouth daily Monday-Saturday. Do NOT take Sunday (Patient taking differently: Take 200 mg by mouth See admin instructions. Take one tablet (200 mg)  by mouth daily Monday-Saturday. Do NOT take Sunday) 90 tablet 3   cetirizine (ZYRTEC) 10 MG tablet Take 10 mg by mouth every morning.     Docusate Sodium 100 MG capsule Take 100 mg by mouth every morning.     Dulaglutide (TRULICITY) 1.5 YP/9.5KD SOPN Inject 1.5 mg into the skin every Thursday.     ELIQUIS 2.5 MG TABS tablet TAKE 1 TABLET(2.5 MG) BY MOUTH TWICE DAILY (Patient taking differently: Take 2.5 mg by mouth 2 (two) times daily.) 60 tablet 2   folic acid (FOLVITE) 326 MCG tablet Take 400 mcg by mouth every morning.     insulin glargine (LANTUS) 100 unit/mL SOPN Inject 16 Units into the skin  at bedtime. 15 mL 11   lisinopril (ZESTRIL) 5 MG tablet Take 1 tablet (5 mg total) by mouth daily. (Patient taking differently: Take 5 mg by mouth every morning.) 30 tablet 0   metoprolol succinate (TOPROL-XL) 25 MG 24 hr tablet Take 1 tablet (25 mg total) by mouth daily. (Patient taking differently: Take 25 mg by mouth every morning.) 30 tablet 0   omeprazole (PRILOSEC OTC) 20 MG tablet Take 20 mg by mouth every morning.     oxybutynin (DITROPAN) 5 MG tablet TAKE 1 TABLET(5 MG) BY MOUTH TWICE DAILY (Patient taking  differently: Take 5 mg by mouth 2 (two) times daily.) 180 tablet 1   polyethylene glycol (MIRALAX / GLYCOLAX) 17 g packet Take 17 g by mouth every morning.     potassium chloride (KLOR-CON) 10 MEQ tablet Take 2 tablets (20 mEq total) by mouth daily.     pregabalin (LYRICA) 50 MG capsule Take 50 mg by mouth every morning.     pregabalin (LYRICA) 75 MG capsule Take 1 capsule (75 mg total) by mouth at bedtime. 90 capsule 1   torsemide (DEMADEX) 20 MG tablet Take 1 tablet (20 mg total) by mouth 2 (two) times daily. You may take an extra 20 mg tablet daily as needed for swelling 30 tablet 0   acetaminophen (TYLENOL) 650 MG CR tablet Take 650 mg by mouth every 8 (eight) hours. (Patient not taking: Reported on 06/21/2022)     anastrozole (ARIMIDEX) 1 MG tablet TAKE 1 TABLET(1 MG) BY MOUTH DAILY (Patient not taking: Reported on 06/21/2022) 90 tablet 3   atorvastatin (LIPITOR) 10 MG tablet Take 10 mg by mouth at bedtime. (Patient not taking: Reported on 06/21/2022)     ferrous sulfate 325 (65 FE) MG tablet Take 325 mg by mouth 3 (three) times daily with meals. (Patient not taking: Reported on 06/21/2022)     Menthol, Topical Analgesic, (BIOFREEZE) 4 % GEL Apply 1 application. topically 3 (three) times daily. For shoulder pain (Patient not taking: Reported on 06/21/2022)     vitamin B-12 (CYANOCOBALAMIN) 500 MCG tablet Take 500 mcg by mouth every morning. (Patient not taking: Reported on 06/21/2022)     vitamin C (ASCORBIC ACID) 500 MG tablet Take 500 mg by mouth 3 (three) times daily. (Patient not taking: Reported on 06/21/2022)     No current facility-administered medications for this visit.    Allergies:   Aspirin, Penicillins, and Remeron [mirtazapine]   Social History: Social History   Socioeconomic History   Marital status: Widowed    Spouse name: Not on file   Number of children: Not on file   Years of education: Not on file   Highest education level: Not on file  Occupational History    Occupation: retired  Tobacco Use   Smoking status: Never   Smokeless tobacco: Never  Vaping Use   Vaping Use: Never used  Substance and Sexual Activity   Alcohol use: No   Drug use: No   Sexual activity: Not Currently  Other Topics Concern   Not on file  Social History Narrative   Lives at home with grandchild, daughter comes and help    Social Determinants of Health   Financial Resource Strain: Medium Risk (08/01/2020)   Overall Financial Resource Strain (CARDIA)    Difficulty of Paying Living Expenses: Somewhat hard  Food Insecurity: No Food Insecurity (04/02/2020)   Hunger Vital Sign    Worried About Running Out of Food in the Last Year: Never true  Ran Out of Food in the Last Year: Never true  Transportation Needs: No Transportation Needs (04/02/2020)   PRAPARE - Hydrologist (Medical): No    Lack of Transportation (Non-Medical): No  Physical Activity: Inactive (04/02/2020)   Exercise Vital Sign    Days of Exercise per Week: 0 days    Minutes of Exercise per Session: 0 min  Stress: No Stress Concern Present (04/02/2020)   [place]    Feeling of Stress : Only a little  Social Connections: Not on file  Intimate Partner Violence: Not At Risk (04/02/2019)   Humiliation, Afraid, Rape, and Kick questionnaire    Fear of Current or Ex-Partner: No    Emotionally Abused: No    Physically Abused: No    Sexually Abused: No    Family History: Family History  Problem Relation Age of Onset   Stroke Mother    Cancer Father        prostate   Hypertension Daughter    Heart attack Brother        x2   Hypertension Brother    Stroke Sister    Hypertension Sister    Breast cancer Maternal Aunt      Review of Systems: All other systems reviewed and are otherwise negative except as noted above.  Physical Exam: Vitals:   06/21/22 0920  BP: 118/76  Pulse: 71  SpO2: 98%  Weight:  192 lb 6.4 oz (87.3 kg)  Height: '5\' 5"'$  (1.651 m)     GEN- The patient is elderly appearing, alert and oriented x 3 today.   HEENT: normocephalic, atraumatic; sclera clear, conjunctiva pink; hearing intact; oropharynx clear; neck supple  Lungs- Clear to ausculation bilaterally, normal work of breathing.  No wheezes, rales, rhonchi Heart- Regular rate and rhythm, no murmurs, rubs or gallops  GI- soft, non-tender, non-distended, bowel sounds present  Extremities- no clubbing or cyanosis. No edema MS- no significant deformity or atrophy Skin- warm and dry, no rash or lesion; PPM pocket well healed Psych- euthymic mood, full affect Neuro- strength and sensation are intact  PPM Interrogation- reviewed in detail today,  See PACEART report  EKG:  EKG is not ordered today. Personal review of ekg ordered  02/07/2022  shows AF with VP at 60 bpm  Recent Labs: 02/10/2022: B Natriuretic Peptide 408.1 02/11/2022: ALT 22; BUN 12; Creatinine, Ser 1.21; Hemoglobin 9.4; Magnesium 2.2; Platelets 146; Potassium 3.6; Sodium 143   Wt Readings from Last 3 Encounters:  06/21/22 192 lb 6.4 oz (87.3 kg)  02/08/22 190 lb 7.6 oz (86.4 kg)  12/10/21 189 lb 9.6 oz (86 kg)     Other studies Reviewed: Additional studies/ records that were reviewed today include: Previous EP office notes, Previous remote checks, Most recent labwork.   Assessment and Plan:  1. CHB s/p Biotronik PPM  Normal PPM function See Pace Art report No changes today  2. HTN Stable on current regimen   3. AF, Permanent 4. SVT On eliquis Rates controlled. Decrease amiodarone to 200 mg Monday through Friday, none on Sat/Sun. We discussed stopping vs using as continued rate / SVT control. She wishes to make minimal changes.  Surveillance labs today.    Current medicines are reviewed at length with the patient today.    Labs/ tests ordered today include:  Orders Placed This Encounter  Procedures   T4, free   TSH   Comprehensive  metabolic panel  Disposition:   Follow up with Dr. Lovena Le in 12 months    Signed, Annamaria Helling  06/21/2022 9:23 AM  [facility] [address] [place]  [place] [address] [phone] (office) [phone] (fax)

## 2022-06-20 ENCOUNTER — Telehealth: Payer: Self-pay | Admitting: Adult Health

## 2022-06-20 NOTE — Telephone Encounter (Signed)
Rescheduled appointment per provider PAL. Patient is aware of the changes made to her upcoming appointment. 

## 2022-06-21 ENCOUNTER — Encounter: Payer: Self-pay | Admitting: Student

## 2022-06-21 ENCOUNTER — Ambulatory Visit: Payer: Medicare Other | Attending: Student | Admitting: Student

## 2022-06-21 VITALS — BP 118/76 | HR 71 | Ht 65.0 in | Wt 192.4 lb

## 2022-06-21 DIAGNOSIS — I10 Essential (primary) hypertension: Secondary | ICD-10-CM

## 2022-06-21 DIAGNOSIS — I440 Atrioventricular block, first degree: Secondary | ICD-10-CM

## 2022-06-21 DIAGNOSIS — I471 Supraventricular tachycardia: Secondary | ICD-10-CM

## 2022-06-21 DIAGNOSIS — Z950 Presence of cardiac pacemaker: Secondary | ICD-10-CM

## 2022-06-21 DIAGNOSIS — I442 Atrioventricular block, complete: Secondary | ICD-10-CM | POA: Diagnosis not present

## 2022-06-21 DIAGNOSIS — R55 Syncope and collapse: Secondary | ICD-10-CM

## 2022-06-21 LAB — COMPREHENSIVE METABOLIC PANEL
ALT: 14 IU/L (ref 0–32)
AST: 21 IU/L (ref 0–40)
Albumin/Globulin Ratio: 1.5 (ref 1.2–2.2)
Albumin: 4.1 g/dL (ref 3.7–4.7)
Alkaline Phosphatase: 96 IU/L (ref 44–121)
BUN/Creatinine Ratio: 22 (ref 12–28)
BUN: 36 mg/dL — ABNORMAL HIGH (ref 8–27)
Bilirubin Total: 0.3 mg/dL (ref 0.0–1.2)
CO2: 29 mmol/L (ref 20–29)
Calcium: 9.1 mg/dL (ref 8.7–10.3)
Chloride: 106 mmol/L (ref 96–106)
Creatinine, Ser: 1.62 mg/dL — ABNORMAL HIGH (ref 0.57–1.00)
Globulin, Total: 2.8 g/dL (ref 1.5–4.5)
Glucose: 164 mg/dL — ABNORMAL HIGH (ref 70–99)
Potassium: 4.5 mmol/L (ref 3.5–5.2)
Sodium: 147 mmol/L — ABNORMAL HIGH (ref 134–144)
Total Protein: 6.9 g/dL (ref 6.0–8.5)
eGFR: 31 mL/min/{1.73_m2} — ABNORMAL LOW (ref >59)

## 2022-06-21 LAB — CUP PACEART INCLINIC DEVICE CHECK
Date Time Interrogation Session: 20230829101402
Implantable Lead Implant Date: 20200624
Implantable Lead Implant Date: 20200624
Implantable Lead Location: 753859
Implantable Lead Location: 753860
Implantable Lead Model: 377
Implantable Lead Model: 377
Implantable Lead Serial Number: 81195510
Implantable Pulse Generator Implant Date: 20200624
Pulse Gen Model: 407145
Pulse Gen Serial Number: 69638782

## 2022-06-21 LAB — TSH: TSH: 1.73 u[IU]/mL (ref 0.450–4.500)

## 2022-06-21 LAB — T4, FREE: Free T4: 1.32 ng/dL (ref 0.82–1.77)

## 2022-06-21 MED ORDER — AMIODARONE HCL 200 MG PO TABS: ORAL_TABLET | ORAL | 3 refills | Status: AC

## 2022-06-21 NOTE — Patient Instructions (Signed)
Medication Instructions:  Your physician has recommended you make the following change in your medication:   DECREASE: Amiodarone to '200mg'$  M-F only, hold Saturday & Sunday  *If you need a refill on your cardiac medications before your next appointment, please call your pharmacy*   Lab Work: TODAY: CMET, TSH, FreeT4  If you have labs (blood work) drawn today and your tests are completely normal, you will receive your results only by: [place] (if you have MyChart) OR A paper copy in the mail If you have any lab test that is abnormal or we need to change your treatment, we will call you to review the results.   Follow-Up: At [facility], you and your health needs are our priority.  As part of our continuing mission to provide you with exceptional heart care, we have created designated Provider Care Teams.  These Care Teams include your primary Cardiologist (physician) and Advanced Practice Providers (APPs -  Physician Assistants and Nurse Practitioners) who all work together to provide you with the care you need, when you need it.   Your next appointment:   1 year(s)  The format for your next appointment:   In Person  Provider:   Cristopher Peru, MD

## 2022-06-22 ENCOUNTER — Ambulatory Visit: Payer: Medicare Other | Admitting: Adult Health

## 2022-06-23 ENCOUNTER — Ambulatory Visit: Payer: Medicare Other | Admitting: Adult Health

## 2022-07-04 ENCOUNTER — Inpatient Hospital Stay: Payer: Medicare Other | Admitting: Adult Health

## 2022-07-25 ENCOUNTER — Inpatient Hospital Stay: Payer: Medicare Other | Admitting: Adult Health

## 2022-08-04 ENCOUNTER — Ambulatory Visit (INDEPENDENT_AMBULATORY_CARE_PROVIDER_SITE_OTHER): Payer: Medicare Other

## 2022-08-04 DIAGNOSIS — I442 Atrioventricular block, complete: Secondary | ICD-10-CM

## 2022-08-04 LAB — CUP PACEART REMOTE DEVICE CHECK
Date Time Interrogation Session: 20231012090037
Implantable Lead Implant Date: 20200624
Implantable Lead Implant Date: 20200624
Implantable Lead Location: 753859
Implantable Lead Location: 753860
Implantable Lead Model: 377
Implantable Lead Model: 377
Implantable Lead Serial Number: 81195510
Implantable Pulse Generator Implant Date: 20200624
Pulse Gen Model: 407145
Pulse Gen Serial Number: 69638782

## 2022-08-09 ENCOUNTER — Inpatient Hospital Stay: Payer: Medicare Other | Attending: Adult Health | Admitting: Adult Health

## 2022-08-09 ENCOUNTER — Other Ambulatory Visit: Payer: Self-pay

## 2022-08-09 VITALS — BP 154/77 | HR 72 | Temp 97.9°F | Resp 18 | Ht 65.0 in | Wt 190.8 lb

## 2022-08-09 DIAGNOSIS — C50311 Malignant neoplasm of lower-inner quadrant of right female breast: Secondary | ICD-10-CM

## 2022-08-09 DIAGNOSIS — Z853 Personal history of malignant neoplasm of breast: Secondary | ICD-10-CM | POA: Diagnosis present

## 2022-08-09 DIAGNOSIS — N183 Chronic kidney disease, stage 3 unspecified: Secondary | ICD-10-CM | POA: Insufficient documentation

## 2022-08-09 DIAGNOSIS — Z170 Estrogen receptor positive status [ER+]: Secondary | ICD-10-CM | POA: Diagnosis not present

## 2022-08-10 ENCOUNTER — Telehealth: Payer: Self-pay | Admitting: Hematology and Oncology

## 2022-08-10 NOTE — Telephone Encounter (Signed)
Scheduled appointment per 10/17 los. Patient is aware.

## 2022-08-11 NOTE — Progress Notes (Signed)
Remote pacemaker transmission.   

## 2022-08-14 ENCOUNTER — Encounter: Payer: Self-pay | Admitting: Adult Health

## 2022-08-14 NOTE — Progress Notes (Signed)
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, estrogen receptor positive ([place])  11/19/2018 Initial Diagnosis   Palpable right breast mass with calcifications, mammogram revealed focal asymmetry lower right breast 5:00 measuring 4.6 cm, ultrasound revealed overall size of 5.7 cm ultrasound-guided biopsy revealed grade 2 IDC ER 90% PR 80% Ki-67 5%, HER-2 negative, T3N0 stage IIa clinical stage   11/28/2018 Cancer Staging   Staging form: Breast, AJCC 8th Edition - Clinical: Stage IIA (cT3, cN0, cM0, G2, ER+, PR+, HER2-) - Signed by Nicholas Lose, MD on 11/28/2018   11/28/2018 -  Neo-Adjuvant Anti-estrogen oral therapy   Anastrozole daily, no surgery planned due to comorbidities     CURRENT THERAPY:  INTERVAL HISTORY: [name] 88 y.o. female returns for f/u of her stage IIA breast cancer.  She was previously on treatment with anastrozole daily and tolerated it well.  She stopped taking it in April 2023 after discussion with Dr. Lindi Adie.   Her most recent mammogram occurred on 10/19/2021 and showed no progression in the size of her right breast cancer and no or new breast concerns.  She continues to live at a nursing home and is doing well without concerns.    Patient Active Problem List   Diagnosis Date Noted   Pressure injury of skin 02/10/2022   Melena 02/08/2022   Chest pain 02/08/2022   Cough 02/08/2022   Diabetic neuropathy ([place]) 12/10/2021   Diabetic  nephropathy ([place]) 12/10/2021   Hyperglycemia due to type 2 diabetes mellitus ([place]) 12/10/2021   Pure hypercholesterolemia 12/10/2021   Osteoarthritis of knee 12/10/2021   Complete heart block ([place]) 05/14/2021   Acute on chronic combined systolic and diastolic CHF (congestive heart failure) ([place]) 10/08/2020   GERD (gastroesophageal reflux disease) 10/08/2020   Declining functional status    Goals of care, counseling/discussion    Palliative care by specialist    Aspiration pneumonia ([place]) 07/13/2020   Hypoglycemia 07/13/2020   Elevated troponin 07/13/2020   Sepsis ([place]) 07/13/2020   Syncope and collapse 06/22/2020   COVID-19 virus infection 06/22/2020   Hypokalemia 06/22/2020   Pacemaker 05/06/2020   Syncope 08/10/2019   Displaced fracture of lateral malleolus of right fibula, initial encounter for closed fracture 07/10/2019   Pain in left shoulder 07/10/2019   Acute CHF (congestive heart failure) ([place]) 06/09/2019   Acute pulmonary edema ([place])    Personal history of breast cancer 04/22/2019   Pressure ulcer 01/04/2019   Acute pancreatitis 01/02/2019   Malignant neoplasm of lower-inner quadrant of right breast of female, estrogen receptor positive ([place]) 11/23/2018   Hematoma of right iliopsoas muscle 11/06/2018   Intramuscular hematoma 11/06/2018   Hypertensive heart disease with heart failure ([place]) 09/05/2017   Aphasia 08/31/2017   Dysphagia 08/31/2017   CKD (chronic kidney disease), stage III (HCC)    LBBB (left bundle branch block)    Other pancytopenia ([place])    HTN (hypertension)    Obesity    DVT (deep venous thrombosis) ([place])    DM (  diabetes mellitus) ([place]) 07/28/2015   Fall 07/28/2015   Warfarin-induced coagulopathy ([place]) 07/28/2015   Acute on chronic diastolic CHF (congestive heart failure) ([place]) 03/19/2014   Edema 08/06/2013   SVT (supraventricular tachycardia) ([place]) 10/15/2012   First degree AV block 10/15/2012   Acute renal insufficiency 10/14/2012   Dizziness  and giddiness    Thrombocytopenia ([place]) 11/21/2011    is allergic to aspirin, penicillins, and remeron [mirtazapine].  MEDICAL HISTORY: Past Medical History:  Diagnosis Date   Abnormal echocardiogram    a. possible mass on echo 05/2019 on PPM, b. no evidence of mass / atrial lead vegetation on follow up echo 06/2019   Anemia    Arthritis    Breast cancer ([place]) 10/2018   Invasive ductal carcinoma   Cancer (HCC)    Cataract    Chronic combined systolic and diastolic CHF (congestive heart failure) ([place])    a. Previously diastolic but patient AVS from outside hospital listed systolic CHF and cardiomyopathy, records pending.   CKD (chronic kidney disease), stage III (HCC)    Clotting disorder (HCC)    CVA (cerebral vascular accident) ([place])    residual right sided weakness and mild dysphagia   Diabetes mellitus ([place])    Dizziness and giddiness    DVT (deep venous thrombosis) ([place])    a. on anticoagulation for this.   Essential hypertension    LBBB (left bundle branch block)    Mitral regurgitation    a. Mod MR by echo 2014.   Muscular deconditioning    Obesity    Pacemaker    Pericardial effusion    SVT (supraventricular tachycardia) ([place])    a. In 2013 she had an EPS with ablation for SVT which did not eliminate the SVT completely. She also had bradycardia which limited medication. She was placed on amiodarone by Dr. Lovena Le.   Thrombocytopenia ([place]) 11/21/2011    SURGICAL HISTORY: Past Surgical History:  Procedure Laterality Date   BREAST BIOPSY Right 11/19/2018   positive   ESOPHAGOGASTRODUODENOSCOPY (EGD) WITH PROPOFOL N/A 02/10/2022   Procedure: ESOPHAGOGASTRODUODENOSCOPY (EGD) WITH PROPOFOL;  Surgeon: Carol Ada, MD;  Location: [facility];  Service: Gastroenterology;  Laterality: N/A;   EYE SURGERY     SUPRAVENTRICULAR TACHYCARDIA ABLATION N/A 10/11/2012   Procedure: SUPRAVENTRICULAR TACHYCARDIA ABLATION;  Surgeon: Evans Lance, MD;  Location: [facility] CATH LAB;  Service:  Cardiovascular;  Laterality: N/A;    SOCIAL HISTORY: Social History   Socioeconomic History   Marital status: Widowed    Spouse name: Not on file   Number of children: Not on file   Years of education: Not on file   Highest education level: Not on file  Occupational History   Occupation: retired  Tobacco Use   Smoking status: Never   Smokeless tobacco: Never  Vaping Use   Vaping Use: Never used  Substance and Sexual Activity   Alcohol use: No   Drug use: No   Sexual activity: Not Currently  Other Topics Concern   Not on file  Social History Narrative   Lives at home with grandchild, daughter comes and help    Social Determinants of Health   Financial Resource Strain: Medium Risk (08/01/2020)   Overall Financial Resource Strain (CARDIA)    Difficulty of Paying Living Expenses: Somewhat hard  Food Insecurity: No Food Insecurity (04/02/2020)   Hunger Vital Sign    Worried About Running Out of Food in the Last Year: Never true    [place] in the Last  Year: Never true  Transportation Needs: No Transportation Needs (04/02/2020)   PRAPARE - Hydrologist (Medical): No    Lack of Transportation (Non-Medical): No  Physical Activity: Inactive (04/02/2020)   Exercise Vital Sign    Days of Exercise per Week: 0 days    Minutes of Exercise per Session: 0 min  Stress: No Stress Concern Present (04/02/2020)   [place]    Feeling of Stress : Only a little  Social Connections: Not on file  Intimate Partner Violence: Not At Risk (04/02/2019)   Humiliation, Afraid, Rape, and Kick questionnaire    Fear of Current or Ex-Partner: No    Emotionally Abused: No    Physically Abused: No    Sexually Abused: No    FAMILY HISTORY: Family History  Problem Relation Age of Onset   Stroke Mother    Cancer Father        prostate   Hypertension Daughter    Heart attack Brother        x2    Hypertension Brother    Stroke Sister    Hypertension Sister    Breast cancer Maternal Aunt     Review of Systems  Constitutional:  Negative for appetite change, chills, fatigue, fever and unexpected weight change.  HENT:   Negative for hearing loss, lump/mass and trouble swallowing.   Eyes:  Negative for eye problems and icterus.  Respiratory:  Negative for chest tightness, cough and shortness of breath.   Cardiovascular:  Negative for chest pain, leg swelling and palpitations.  Gastrointestinal:  Negative for abdominal distention, abdominal pain, constipation, diarrhea, nausea and vomiting.  Endocrine: Negative for hot flashes.  Genitourinary:  Negative for difficulty urinating.   Musculoskeletal:  Negative for arthralgias.  Skin:  Negative for itching and rash.  Neurological:  Negative for dizziness, extremity weakness, headaches and numbness.  Hematological:  Negative for adenopathy. Does not bruise/bleed easily.  Psychiatric/Behavioral:  Negative for depression. The patient is not nervous/anxious.       PHYSICAL EXAMINATION  ECOG PERFORMANCE STATUS: 0 - Asymptomatic  Vitals:   08/09/22 1329  BP: (!) 154/77  Pulse: 72  Resp: 18  Temp: 97.9 F (36.6 C)  SpO2: 99%    Physical Exam Constitutional:      General: She is not in acute distress.    Appearance: Normal appearance. She is not toxic-appearing.  HENT:     Head: Normocephalic and atraumatic.  Eyes:     General: No scleral icterus. Cardiovascular:     Rate and Rhythm: Normal rate and regular rhythm.     Pulses: Normal pulses.     Heart sounds: Normal heart sounds.  Pulmonary:     Effort: Pulmonary effort is normal.     Breath sounds: Normal breath sounds.  Chest:     Comments: Unable to palpate a right breast mass; left breast benign  Abdominal:     General: Abdomen is flat. Bowel sounds are normal. There is no distension.     Palpations: Abdomen is soft.     Tenderness: There is no abdominal  tenderness.  Musculoskeletal:        General: No swelling.     Cervical back: Neck supple.  Lymphadenopathy:     Cervical: No cervical adenopathy.  Skin:    General: Skin is warm and dry.     Findings: No rash.  Neurological:     General: No focal deficit present.  Mental Status: She is alert.  Psychiatric:        Mood and Affect: Mood normal.        Behavior: Behavior normal.     LABORATORY DATA: None for this visit    ASSESSMENT and THERAPY PLAN:   Malignant neoplasm of lower-inner quadrant of right breast of female, estrogen receptor positive ([place]) [name] is here today for f/u of her stage IIA breast cancer on observation alone.  She is no longer taking anastrozole.    Clinically she has no evidence of breast cancer progression.  I placed orders for diagnostic mammogram and ultrasound for continued surveillance.    We will see her back in April, 2024.  She knows to call for any questions or concerns that may arise between now and her next visit.   All questions were answered. The patient knows to call the clinic with any problems, questions or concerns. We can certainly see the patient much sooner if necessary.  Total encounter time:20 minutes*in face-to-face visit time, chart review, lab review, care coordination, order entry, and documentation of the encounter time.  Wilber Bihari, NP 08/14/22 4:27 PM Medical Oncology and Hematology [facility] [place], [place] [address] Tel. [phone]    Fax. [phone]  *Total Encounter Time as defined by the Centers for Medicare and Medicaid Services includes, in addition to the face-to-face time of a patient visit (documented in the note above) non-face-to-face time: obtaining and reviewing outside history, ordering and reviewing medications, tests or procedures, care coordination (communications with other health care professionals or caregivers) and documentation in the medical record.

## 2022-08-14 NOTE — Assessment & Plan Note (Signed)
[name] is here today for f/u of her stage IIA breast cancer on observation alone.  She is no longer taking anastrozole.    Clinically she has no evidence of breast cancer progression.  I placed orders for diagnostic mammogram and ultrasound for continued surveillance.    We will see her back in April, 2024.  She knows to call for any questions or concerns that may arise between now and her next visit.

## 2022-08-30 ENCOUNTER — Ambulatory Visit: Payer: Medicare Other | Admitting: Interventional Cardiology

## 2022-09-08 ENCOUNTER — Ambulatory Visit: Payer: Medicare Other | Admitting: Hematology and Oncology

## 2022-10-07 ENCOUNTER — Telehealth: Payer: Self-pay | Admitting: Hematology and Oncology

## 2022-10-07 NOTE — Telephone Encounter (Signed)
Patient called to confirm upcoming appointment. Patient notified and mailing reminder.

## 2022-11-03 ENCOUNTER — Ambulatory Visit (INDEPENDENT_AMBULATORY_CARE_PROVIDER_SITE_OTHER): Payer: Medicare Other

## 2022-11-03 DIAGNOSIS — I440 Atrioventricular block, first degree: Secondary | ICD-10-CM | POA: Diagnosis not present

## 2022-11-03 LAB — CUP PACEART REMOTE DEVICE CHECK
Battery Voltage: 70
Date Time Interrogation Session: 20240111080939
Implantable Lead Connection Status: 753985
Implantable Lead Connection Status: 753985
Implantable Lead Implant Date: 20200624
Implantable Lead Implant Date: 20200624
Implantable Lead Location: 753859
Implantable Lead Location: 753860
Implantable Lead Model: 377
Implantable Lead Model: 377
Implantable Lead Serial Number: 81195510
Implantable Pulse Generator Implant Date: 20200624
Pulse Gen Model: 407145
Pulse Gen Serial Number: 69638782

## 2022-11-07 IMAGING — CT CT HEAD W/O CM
4 series · 15 of 47 positions shown, 17 images · non-contrast
Comparison: Head CT 07/13/2020.  Cervical spine CT 08/10/2019.

CLINICAL DATA: Head trauma. Fall. Hit back of head on door. On
Eliquis.

EXAM:
CT HEAD WITHOUT CONTRAST
CT CERVICAL SPINE WITHOUT CONTRAST
TECHNIQUE: Multidetector CT imaging of the head and cervical spine was
performed following the standard protocol without intravenous
contrast. Multiplanar CT image reconstructions of the cervical spine
were also generated.

[Series 3: head wo · axial · 0.46mm/px · z∈[-166,-56]mm · 6 of 32 slices shown, 8 images]
[im 5/32  brain]
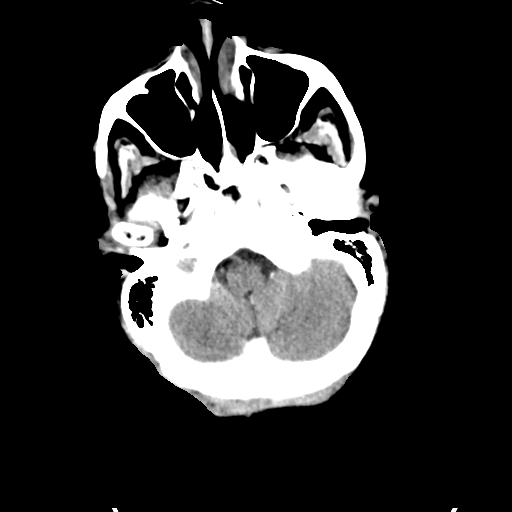
[im 5/32  bone]
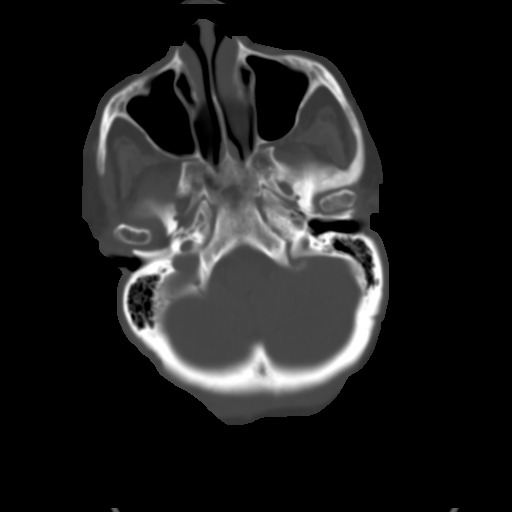
[im 9/32  brain]
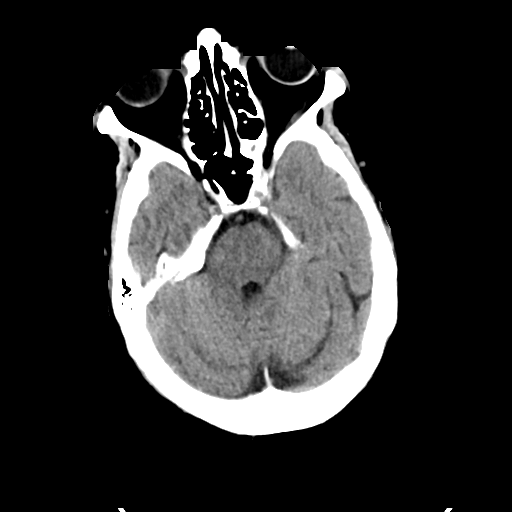
[im 14/32  brain]
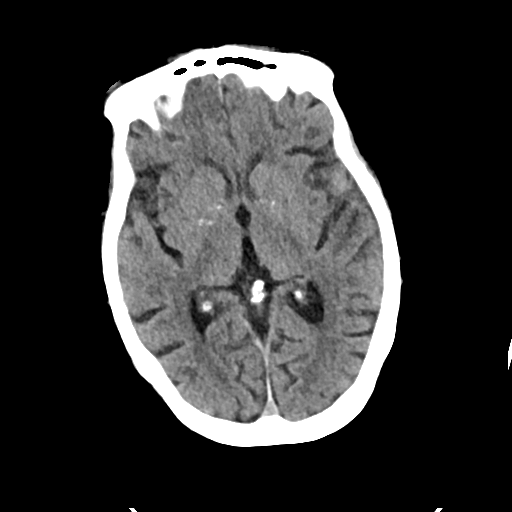
[im 18/32  brain]
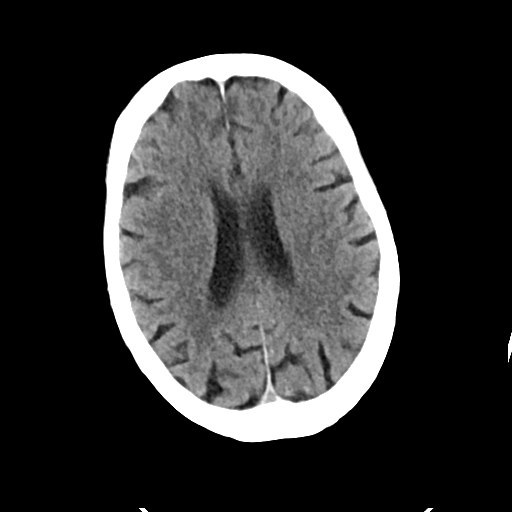
[im 23/32  brain]
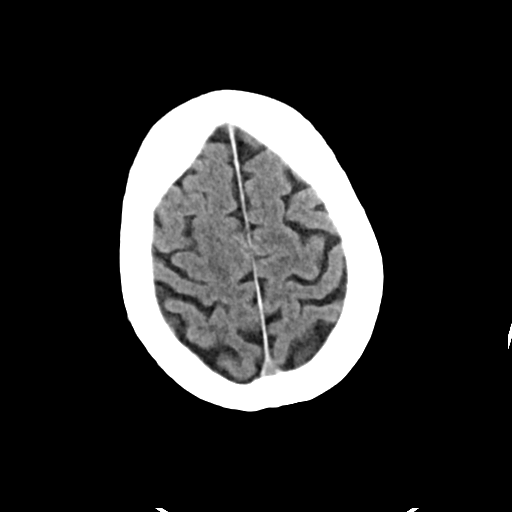
[im 23/32  bone]
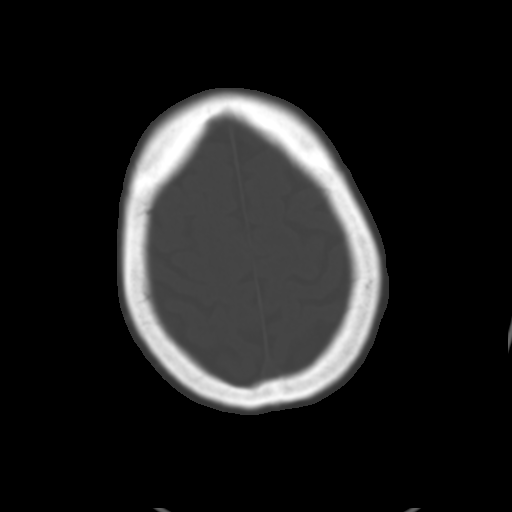
[im 27/32  brain]
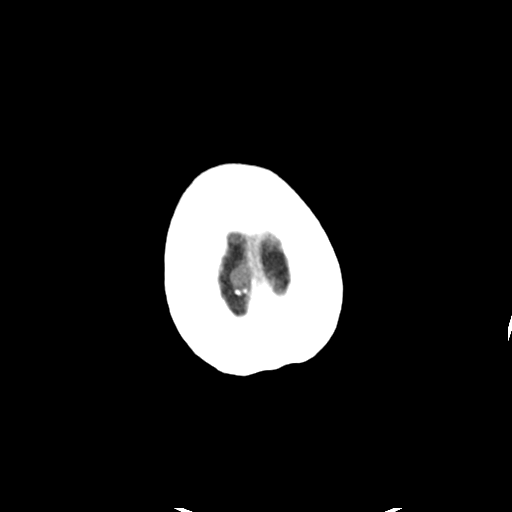

[Series 4: head bone · axial · 0.46mm/px · z∈[-172,-134]mm · 3 of 81 slices shown]
[im 8/81  bone]
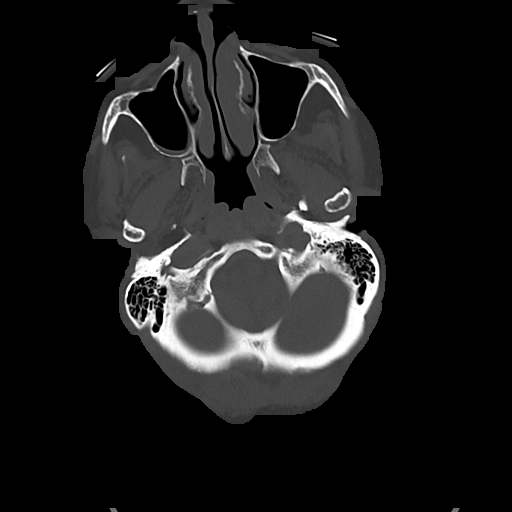
[im 16/81  bone]
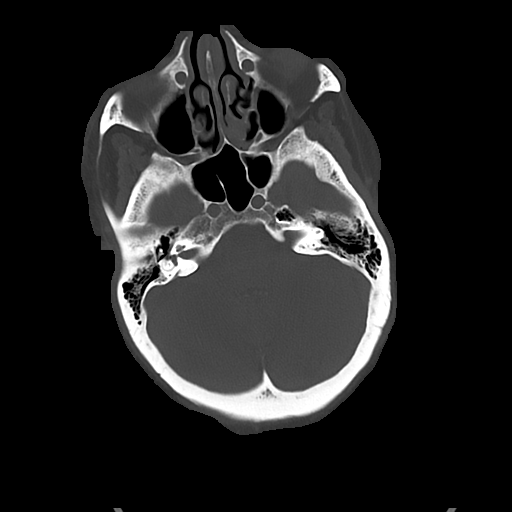
[im 27/81  bone]
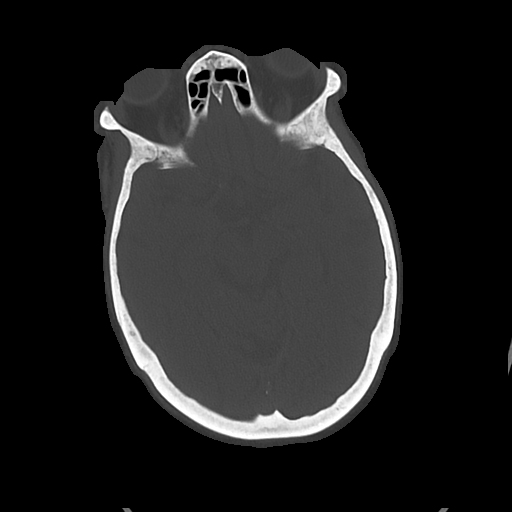

[Series 5: cor soft · coronal · 0.32mm/px · 3 of 70 slices shown]
[im 24/70  brain]
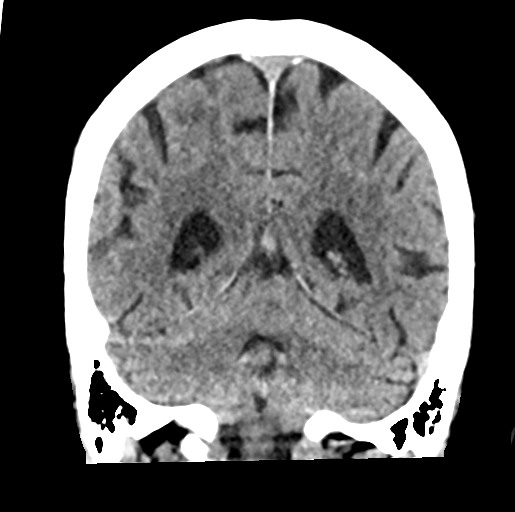
[im 31/70  brain]
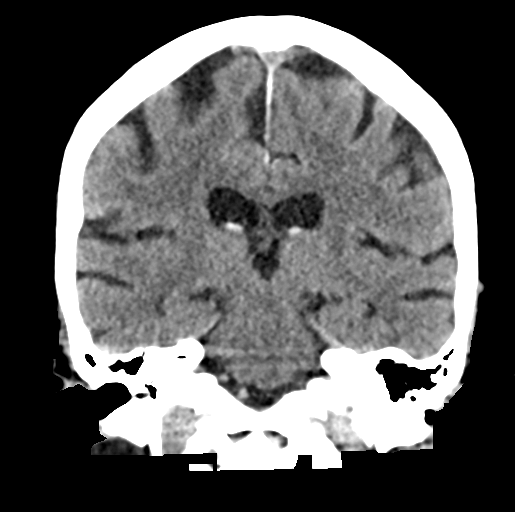
[im 39/70  brain]
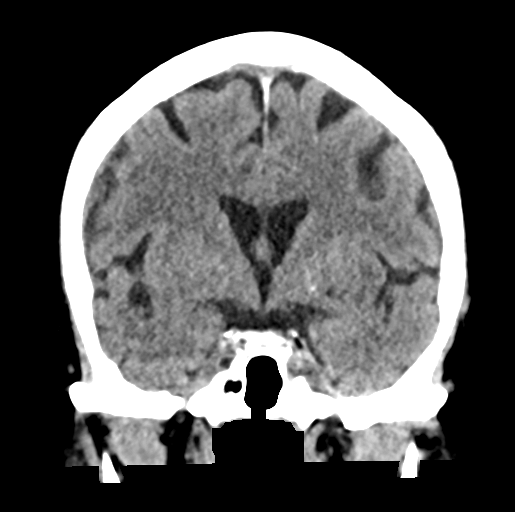

[Series 6: sag soft · sagittal · 0.32mm/px · 3 of 55 slices shown]
[im 19/55  brain]
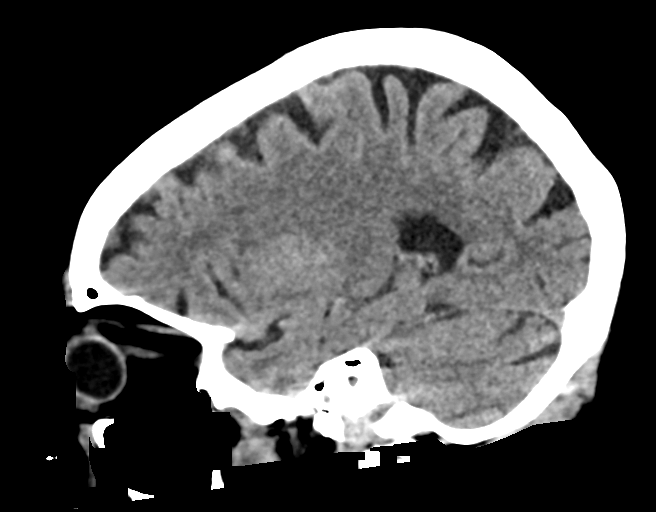
[im 28/55  brain]
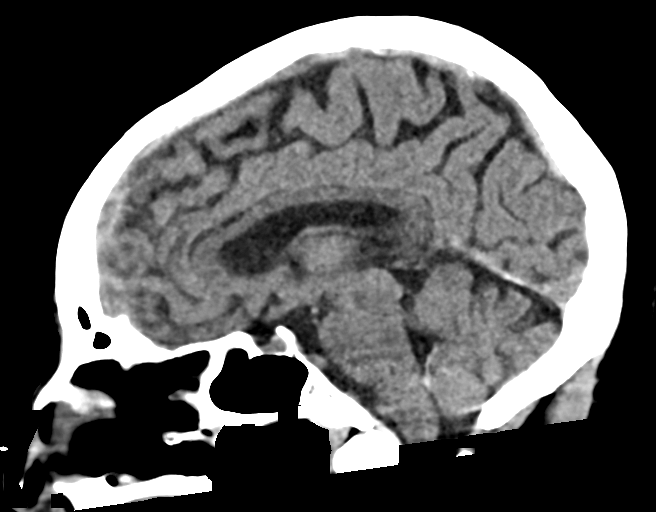
[im 37/55  brain]
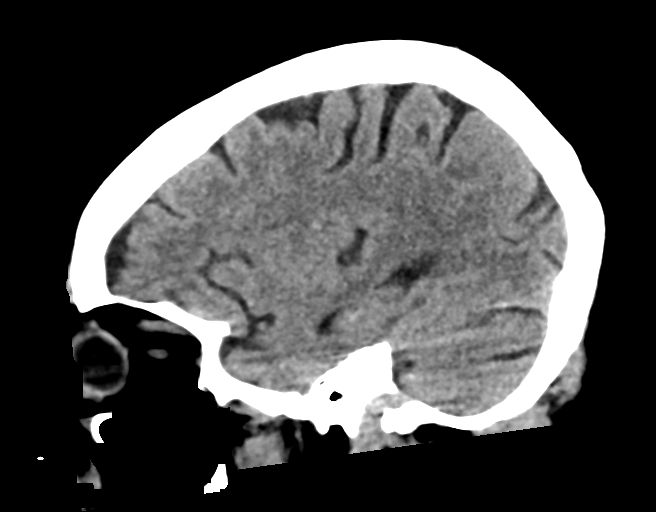

[15 of 47 positions shown; findings below may reference images not displayed]

FINDINGS: CT HEAD FINDINGS

Brain: There is no evidence of an acute infarct, intracranial
hemorrhage, mass, midline shift, or extra-axial fluid collection.
The ventricles and sulci are within normal limits for age.
Hypodensities in the cerebral white matter bilaterally are
nonspecific but compatible with minimal chronic small vessel
ischemic disease.

Vascular: Calcified atherosclerosis at the skull base. No hyperdense
vessel.

Skull: No fracture or suspicious osseous lesion.

Sinuses/Orbits: Chronic opacification of a posterior left ethmoid
air cell. Clear mastoid air cells. Right cataract extraction.

Other: None.

CT CERVICAL SPINE FINDINGS

Alignment: Chronic grade 1 at retrolisthesis of C3 on C4 which has
mildly progressed and is degenerative in appearance. Chronic grade 1
anterolisthesis of C5 on C6, C6 on C7, C7 on T1, and T2 on T3, not
significantly changed and also degenerative in appearance.

Skull base and vertebrae: No acute fracture or suspicious osseous
lesion.

Soft tissues and spinal canal: No prevertebral fluid or swelling. No
visible canal hematoma.

Disc levels: Progressive severe disc degeneration at C3-4 with
severe disc space narrowing, degenerative endplate irregularity and
sclerosis with Schmorl's nodes, and a broad posterior disc
herniation resulting in severe spinal stenosis. Chronic severe left
C5-6 facet arthrosis with erosions. Severe left neural foraminal
stenosis at C5-6 due to anterolisthesis, uncovertebral spurring, and
facet arthrosis.

Upper chest: Mild interlobular septal thickening and mild mosaic
attenuation in the lung apices.

Other: Minimal calcific atherosclerosis at the carotid bifurcations.
IMPRESSION: 1. No evidence of acute intracranial abnormality.
2. No evidence of acute fracture or traumatic subluxation in the
cervical spine.
3. Progressive severe disc degeneration at C3-4 with severe spinal
stenosis.
4. Mild interlobular septal thickening and mosaic attenuation in the
lung apices, query mild edema.

## 2022-11-08 ENCOUNTER — Telehealth: Payer: Self-pay | Admitting: *Deleted

## 2022-11-08 NOTE — Telephone Encounter (Signed)
I agree with this plan to increase torsemide for 5 days. [name]

## 2022-11-08 NOTE — Telephone Encounter (Signed)
I spoke with Ivor Reining, NP who has been seeing patient at her facility.  Tiffany reports BNP was done on 1/8 due to swelling.  BNP was 425.  Weight was up a little. Patient was given one extra torsemide 20 mg daily for 2 days.  Lab work was checked on 1/15 and BNP was 739. BUN 23.1, creatinine 1.32.  Weight had been running 191 -192 lbs.  Currently 195 lbs. Patient has swelling in her legs. Per med list patient can take one extra torsemide 20 mg daily as needed in addition to 20 mg twice daily dose.  Tiffany will have patient take extra dose for next 5 days.  Patient is due for follow up.  Appointment made for December 01, 2021 with Nicholes Rough, PA. Lab work will be sent with patient to this appointment.  I told Tiffany I would make Dr Irish Lack aware and let her know if he recommended any changes.

## 2022-11-23 NOTE — Progress Notes (Signed)
Remote pacemaker transmission.   

## 2022-11-30 NOTE — Progress Notes (Unsigned)
Office Visit    Patient Name: [name] Date of Encounter: 12/01/2022  PCP:  Kathyrn Lass   [place]  Cardiologist:  Larae Grooms, MD  Advanced Practice Provider:  No care team member to display Electrophysiologist:  Cristopher Peru, MD   HPI    [name] is a 89 y.o. female with a past medical history of breast cancer, chronic combined systolic and diastolic CHF, CKD, clotting disorder, CVA, diabetes mellitus, LBBB, SVT, pacemaker presents today for follow-up visit.   Last seen by Oda Kilts, PA-C 05/2022. Amio was decreased. Her PPM was functioning normally. She preferred minimal changes.   Today, she is getting over the pneumonia, still coughing up yellow phlegm, she is on them for three more days. She has been doing therapy, left shoulder is doing pretty good. She is sore from the exercises. She had a heart pain the other night. It pulls when she raises her arm.Probably MS in nature.  She has that pulling sensation when she is moving her arm. She had a spirometer and is still using for SOB. SOB with the pneumonia and it has been getting better. Sometimes her heart beats fast but doesn't last long. Sometimes her pacemaker pocket feels swollen but she thinks this is due to her therapy. Her speech is getting worse from the stroke. Had a care meeting with neurology. Her granddaughter things it has to do with some kind of dementia that she was diagnosed with. She is having trouble finding her words at times. Her left leg is much more swollen than the right. They want to make sure her SOB and cough are not due to her heart.   Reports no chest pain, pressure, or tightness. No edema, orthopnea, PND.   Past Medical History    Past Medical History:  Diagnosis Date   Abnormal echocardiogram    a. possible mass on echo 05/2019 on PPM, b. no evidence of mass / atrial lead vegetation on follow up echo 06/2019   Anemia    Arthritis    Breast cancer ([facility] [facility]) 10/2018    Invasive ductal carcinoma   Cancer (HCC)    Cataract    Chronic combined systolic and diastolic CHF (congestive heart failure) ([place])    a. Previously diastolic but patient AVS from outside hospital listed systolic CHF and cardiomyopathy, records pending.   CKD (chronic kidney disease), stage III (HCC)    Clotting disorder (HCC)    CVA (cerebral vascular accident) ([place])    residual right sided weakness and mild dysphagia   Diabetes mellitus ([place])    Dizziness and giddiness    DVT (deep venous thrombosis) ([place])    a. on anticoagulation for this.   Essential hypertension    LBBB (left bundle branch block)    Mitral regurgitation    a. Mod MR by echo 2014.   Muscular deconditioning    Obesity    Pacemaker    Pericardial effusion    SVT (supraventricular tachycardia)    a. In 2013 she had an EPS with ablation for SVT which did not eliminate the SVT completely. She also had bradycardia which limited medication. She was placed on amiodarone by Dr. Lovena Le.   Thrombocytopenia ([place]) 11/21/2011   Past Surgical History:  Procedure Laterality Date   BREAST BIOPSY Right 11/19/2018   positive   ESOPHAGOGASTRODUODENOSCOPY (EGD) WITH PROPOFOL N/A 02/10/2022   Procedure: ESOPHAGOGASTRODUODENOSCOPY (EGD) WITH PROPOFOL;  Surgeon: Carol Ada, MD;  Location: [facility];  Service:  Gastroenterology;  Laterality: N/A;   EYE SURGERY     SUPRAVENTRICULAR TACHYCARDIA ABLATION N/A 10/11/2012   Procedure: SUPRAVENTRICULAR TACHYCARDIA ABLATION;  Surgeon: Evans Lance, MD;  Location: [facility] CATH LAB;  Service: Cardiovascular;  Laterality: N/A;    Allergies  Allergies  Allergen Reactions   Aspirin Itching   Penicillins Itching and Rash    DID THE REACTION INVOLVE: Swelling of the face/tongue/throat, SOB, or low BP? Yes Sudden or severe rash/hives, skin peeling, or the inside of the mouth or nose? No Did it require medical treatment? Yes When did it last happen? "More than 10 years ago" If all above  answers are "NO", may proceed with cephalosporin use.  Tolerated full course of Unasyn in 2021.   Remeron [Mirtazapine] Other (See Comments)    Unknown reaction    EKGs/Labs/Other Studies Reviewed:   The following studies were reviewed today:  Echo 06/23/20  IMPRESSIONS     1. Left ventricular ejection fraction, by estimation, is 30%. The left  ventricle has moderate to severely decreased function. The left ventricle  demonstrates global hypokinesis with septal-lateral dyssynchrony. There is  severe left ventricular  hypertrophy. Left ventricular diastolic parameters are consistent with  Grade I diastolic dysfunction (impaired relaxation).   2. Right ventricular systolic function is mildly reduced. The right  ventricular size is normal. Tricuspid regurgitation signal is inadequate  for assessing PA pressure.   3. Left atrial size was severely dilated.   4. The mitral valve is normal in structure. Trivial mitral valve  regurgitation. No evidence of mitral stenosis.   5. The aortic valve is tricuspid. Aortic valve regurgitation is not  visualized. Mild aortic valve sclerosis is present, with no evidence of  aortic valve stenosis.   6. Aortic dilatation noted. There is mild dilatation of the aortic root  measuring 39 mm.   7. The inferior vena cava is dilated in size with <50% respiratory  variability, suggesting right atrial pressure of 15 mmHg.   FINDINGS   Left Ventricle: Left ventricular ejection fraction, by estimation, is  30%. The left ventricle has moderate to severely decreased function. The  left ventricle demonstrates global hypokinesis. The left ventricular  internal cavity size was normal in size.  There is severe left ventricular hypertrophy. Left ventricular diastolic  parameters are consistent with Grade I diastolic dysfunction (impaired  relaxation).   Right Ventricle: The right ventricular size is normal. No increase in  right ventricular wall thickness.  Right ventricular systolic function is  mildly reduced. Tricuspid regurgitation signal is inadequate for assessing  PA pressure.   Left Atrium: Left atrial size was severely dilated.   Right Atrium: Right atrial size was normal in size.   Pericardium: Trivial pericardial effusion is present.   Mitral Valve: The mitral valve is normal in structure. Trivial mitral  valve regurgitation. No evidence of mitral valve stenosis.   Tricuspid Valve: The tricuspid valve is normal in structure. Tricuspid  valve regurgitation is trivial.   Aortic Valve: The aortic valve is tricuspid. Aortic valve regurgitation is  not visualized. Mild aortic valve sclerosis is present, with no evidence  of aortic valve stenosis.   Pulmonic Valve: The pulmonic valve was normal in structure. Pulmonic valve  regurgitation is not visualized.   Aorta: Aortic dilatation noted. There is mild dilatation of the aortic  root measuring 39 mm.   Venous: The inferior vena cava is dilated in size with less than 50%  respiratory variability, suggesting right atrial pressure of 15 mmHg.  IAS/Shunts: No atrial level shunt detected by color flow Doppler.       EKG:  EKG is not ordered today.   Recent Labs: 02/10/2022: B Natriuretic Peptide 408.1 02/11/2022: Hemoglobin 9.4; Magnesium 2.2; Platelets 146 06/21/2022: ALT 14; BUN 36; Creatinine, Ser 1.62; Potassium 4.5; Sodium 147; TSH 1.730  Recent Lipid Panel    Component Value Date/Time   CHOL 195 08/15/2018 1236   TRIG 56 01/02/2019 1425   HDL 81 08/15/2018 1236   CHOLHDL 2.4 08/15/2018 1236   CHOLHDL 2.6 12/11/2008 0130   VLDL 13 12/11/2008 0130   LDLCALC 96 08/15/2018 1236    Risk Assessment/Calculations:   CHA2DS2-VASc Score = 8   This indicates a 10.8% annual risk of stroke. The patient's score is based upon: CHF History: 1 HTN History: 1 Diabetes History: 1 Stroke History: 2 Vascular Disease History: 0 Age Score: 2 Gender Score: 1     Home  Medications   Current Meds  Medication Sig   acetaminophen (TYLENOL) 650 MG CR tablet Take 650 mg by mouth every 8 (eight) hours.   amiodarone (PACERONE) 200 MG tablet Take one tablet by mouth daily Monday-Friday. Do NOT take Saturday and Sunday   atorvastatin (LIPITOR) 10 MG tablet Take 10 mg by mouth at bedtime.   cetirizine (ZYRTEC) 10 MG tablet Take 10 mg by mouth every morning.   Docusate Sodium 100 MG capsule Take 100 mg by mouth every morning.   Dulaglutide (TRULICITY) 1.5 ER/1.5QM SOPN Inject 1.5 mg into the skin every Thursday.   ELIQUIS 2.5 MG TABS tablet TAKE 1 TABLET(2.5 MG) BY MOUTH TWICE DAILY (Patient taking differently: Take 2.5 mg by mouth 2 (two) times daily.)   ferrous sulfate 325 (65 FE) MG tablet Take 325 mg by mouth 3 (three) times daily with meals.   folic acid (FOLVITE) 086 MCG tablet Take 400 mcg by mouth every morning.   insulin glargine (LANTUS) 100 unit/mL SOPN Inject 16 Units into the skin at bedtime.   lisinopril (ZESTRIL) 5 MG tablet Take 1 tablet (5 mg total) by mouth daily. (Patient taking differently: Take 5 mg by mouth every morning.)   Menthol, Topical Analgesic, (BIOFREEZE) 4 % GEL Apply 1 application  topically 3 (three) times daily. For shoulder pain   metoprolol succinate (TOPROL-XL) 25 MG 24 hr tablet Take 1 tablet (25 mg total) by mouth daily. (Patient taking differently: Take 25 mg by mouth every morning.)   omeprazole (PRILOSEC OTC) 20 MG tablet Take 20 mg by mouth every morning.   oxybutynin (DITROPAN) 5 MG tablet TAKE 1 TABLET(5 MG) BY MOUTH TWICE DAILY (Patient taking differently: Take 5 mg by mouth 2 (two) times daily.)   polyethylene glycol (MIRALAX / GLYCOLAX) 17 g packet Take 17 g by mouth every morning.   potassium chloride (KLOR-CON) 10 MEQ tablet Take 2 tablets (20 mEq total) by mouth daily.   pregabalin (LYRICA) 50 MG capsule Take 50 mg by mouth every morning.   pregabalin (LYRICA) 75 MG capsule Take 1 capsule (75 mg total) by mouth at  bedtime.   torsemide (DEMADEX) 20 MG tablet Take 1 tablet (20 mg total) by mouth 2 (two) times daily. You may take an extra 20 mg tablet daily as needed for swelling   vitamin B-12 (CYANOCOBALAMIN) 500 MCG tablet Take 500 mcg by mouth every morning.   vitamin C (ASCORBIC ACID) 500 MG tablet Take 500 mg by mouth 3 (three) times daily.     Review of Systems  All other systems reviewed and are otherwise negative except as noted above.  Physical Exam    VS:  BP (!) 148/82   Pulse 61   Ht '5\' 5"'$  (1.651 m)   Wt 200 lb 6.4 oz (90.9 kg)   SpO2 99%   BMI 33.35 kg/m  , BMI Body mass index is 33.35 kg/m.  Wt Readings from Last 3 Encounters:  12/01/22 200 lb 6.4 oz (90.9 kg)  08/09/22 190 lb 12.8 oz (86.5 kg)  06/21/22 192 lb 6.4 oz (87.3 kg)     GEN: Well nourished, well developed, in no acute distress. HEENT: normal. Neck: Supple, no JVD, carotid bruits, or masses. Cardiac: RRR, no murmurs, rubs, or gallops. No clubbing, cyanosis, LE edema L > R.  Radials/PT 2+ and equal bilaterally.  Respiratory:  Respirations regular and unlabored, clear to auscultation bilaterally. GI: Soft, nontender, nondistended. MS: No deformity or atrophy. Skin: Warm and dry, no rash. Neuro:  Strength and sensation are intact. Psych: Normal affect.  Assessment & Plan    Pneumonia with cough -keeps getting pneumonia and they are very concerned -would recommend  sputum culture  -she has been on several rounds of antibiotics  Diastolic CHF -will plan for follow-up echo today -continue current medications: Metoprolol succinate 25 mg daily, Demadex 20 mg twice a day -unilateral edema today-will get LE [place] to r/o DVT  CHB s/p PPM -stable -follow-up with EP  HTN -slightly elevated today 148/82 -close monitoring at facility  AF, permanent/SVT -RRR today -on eliquis -continue Amio '200mg'$  Mon-Fri -CHA2DS2-VASc score of 8    Disposition: Follow up 3-4 months with Larae Grooms, MD or  APP.  Signed, Elgie Collard, PA-C 12/01/2022, 12:10 PM [facility]

## 2022-12-01 ENCOUNTER — Encounter: Payer: Self-pay | Admitting: Physician Assistant

## 2022-12-01 ENCOUNTER — Ambulatory Visit: Payer: Medicare Other | Attending: Physician Assistant | Admitting: Physician Assistant

## 2022-12-01 VITALS — BP 148/82 | HR 61 | Ht 65.0 in | Wt 200.4 lb

## 2022-12-01 DIAGNOSIS — I471 Supraventricular tachycardia, unspecified: Secondary | ICD-10-CM | POA: Diagnosis not present

## 2022-12-01 DIAGNOSIS — I824Z9 Acute embolism and thrombosis of unspecified deep veins of unspecified distal lower extremity: Secondary | ICD-10-CM

## 2022-12-01 DIAGNOSIS — I10 Essential (primary) hypertension: Secondary | ICD-10-CM | POA: Diagnosis not present

## 2022-12-01 DIAGNOSIS — I5032 Chronic diastolic (congestive) heart failure: Secondary | ICD-10-CM

## 2022-12-01 DIAGNOSIS — I4821 Permanent atrial fibrillation: Secondary | ICD-10-CM | POA: Diagnosis not present

## 2022-12-01 NOTE — Patient Instructions (Signed)
Medication Instructions:  Your physician recommends that you continue on your current medications as directed. Please refer to the Current Medication list given to you today.  *If you need a refill on your cardiac medications before your next appointment, please call your pharmacy*   Lab Work: None ordered If you have labs (blood work) drawn today and your tests are completely normal, you will receive your results only by: [place] (if you have MyChart) OR A paper copy in the mail If you have any lab test that is abnormal or we need to change your treatment, we will call you to review the results.   Testing/Procedures: Your physician has requested that you have an echocardiogram. Echocardiography is a painless test that uses sound waves to create images of your heart. It provides your doctor with information about the size and shape of your heart and how well your heart's chambers and valves are working. This procedure takes approximately one hour. There are no restrictions for this procedure. Please do NOT wear cologne, perfume, aftershave, or lotions (deodorant is allowed). Please arrive 15 minutes prior to your appointment time.   Your physician has requested that you have a lower extremity venous duplex. This test is an ultrasound of the veins in the legs or arms. It looks at venous blood flow that carries blood from the heart to the legs or arms. Allow one hour for a Lower Venous exam. Allow thirty minutes for an Upper Venous exam. There are no restrictions or special instructions.     Follow-Up: At [facility], you and your health needs are our priority.  As part of our continuing mission to provide you with exceptional heart care, we have created designated Provider Care Teams.  These Care Teams include your primary Cardiologist (physician) and Advanced Practice Providers (APPs -  Physician Assistants and Nurse Practitioners) who all work together to provide you with  the care you need, when you need it.   Your next appointment:   3-4 month(s)  Provider:   Larae Grooms, MD

## 2022-12-02 ENCOUNTER — Telehealth: Payer: Self-pay | Admitting: *Deleted

## 2022-12-02 MED ORDER — TORSEMIDE 20 MG PO TABS: ORAL_TABLET | ORAL | 3 refills | Status: DC

## 2022-12-02 NOTE — Telephone Encounter (Signed)
Received call transferred from operator and spoke with Tula Nakayama, NP who sees patient at her facility.  Tiffany is calling for update after visit.  I let her know sputum culture was recommended.  Tiffany will obtain this.  Also notified that echo and LE venous doppler ordered. No med changes made.  Chart indicates patient is taking torsemide 20 mg twice daily but Tiffany reports patient has been taking Torsemide 40 mg every AM and 20 mg every PM.  Will update med list and make Nicholes Rough, PA aware.  I told Tiffany we would call her back if any changes needed.  Patient's PCP is Dr Jannifer Rodney [place] [place].  Unable to put name in EPIC as PCP.  Will fax yesterday's office note to [place] at [phone]

## 2022-12-09 ENCOUNTER — Ambulatory Visit (HOSPITAL_COMMUNITY)
Admission: RE | Admit: 2022-12-09 | Discharge: 2022-12-09 | Disposition: A | Discharge status: Home / Self Care | Payer: Medicare Other | Source: Ambulatory Visit | Attending: Internal Medicine | Admitting: Internal Medicine

## 2022-12-09 DIAGNOSIS — I824Z9 Acute embolism and thrombosis of unspecified deep veins of unspecified distal lower extremity: Secondary | ICD-10-CM

## 2022-12-12 ENCOUNTER — Ambulatory Visit: Payer: Medicare Other | Admitting: Hematology and Oncology

## 2022-12-16 ENCOUNTER — Other Ambulatory Visit: Payer: Self-pay

## 2022-12-16 ENCOUNTER — Encounter (HOSPITAL_COMMUNITY): Payer: Self-pay | Admitting: Emergency Medicine

## 2022-12-16 ENCOUNTER — Emergency Department (HOSPITAL_COMMUNITY)
Admission: EM | Admit: 2022-12-16 | Discharge: 2022-12-16 | Disposition: A | Discharge status: Home / Self Care | Payer: Medicare Other | Attending: Emergency Medicine | Admitting: Emergency Medicine

## 2022-12-16 ENCOUNTER — Emergency Department (HOSPITAL_COMMUNITY): Payer: Medicare Other

## 2022-12-16 DIAGNOSIS — Z7901 Long term (current) use of anticoagulants: Secondary | ICD-10-CM | POA: Diagnosis not present

## 2022-12-16 DIAGNOSIS — I130 Hypertensive heart and chronic kidney disease with heart failure and stage 1 through stage 4 chronic kidney disease, or unspecified chronic kidney disease: Secondary | ICD-10-CM | POA: Insufficient documentation

## 2022-12-16 DIAGNOSIS — N189 Chronic kidney disease, unspecified: Secondary | ICD-10-CM | POA: Diagnosis not present

## 2022-12-16 DIAGNOSIS — Z853 Personal history of malignant neoplasm of breast: Secondary | ICD-10-CM | POA: Insufficient documentation

## 2022-12-16 DIAGNOSIS — Z794 Long term (current) use of insulin: Secondary | ICD-10-CM | POA: Insufficient documentation

## 2022-12-16 DIAGNOSIS — R0789 Other chest pain: Secondary | ICD-10-CM | POA: Diagnosis not present

## 2022-12-16 DIAGNOSIS — E1122 Type 2 diabetes mellitus with diabetic chronic kidney disease: Secondary | ICD-10-CM | POA: Diagnosis not present

## 2022-12-16 DIAGNOSIS — I5042 Chronic combined systolic (congestive) and diastolic (congestive) heart failure: Secondary | ICD-10-CM | POA: Insufficient documentation

## 2022-12-16 DIAGNOSIS — R079 Chest pain, unspecified: Secondary | ICD-10-CM

## 2022-12-16 DIAGNOSIS — R1013 Epigastric pain: Secondary | ICD-10-CM | POA: Diagnosis not present

## 2022-12-16 LAB — CBC WITH DIFFERENTIAL/PLATELET
Abs Immature Granulocytes: 0.02 10*3/uL (ref 0.00–0.07)
Basophils Absolute: 0 10*3/uL (ref 0.0–0.1)
Basophils Relative: 1 %
Eosinophils Absolute: 0 10*3/uL (ref 0.0–0.5)
Eosinophils Relative: 0 %
HCT: 33.7 % — ABNORMAL LOW (ref 36.0–46.0)
Hemoglobin: 10.7 g/dL — ABNORMAL LOW (ref 12.0–15.0)
Immature Granulocytes: 1 %
Lymphocytes Relative: 25 %
Lymphs Abs: 1 10*3/uL (ref 0.7–4.0)
MCH: 30.1 pg (ref 26.0–34.0)
MCHC: 31.8 g/dL (ref 30.0–36.0)
MCV: 94.9 fL (ref 80.0–100.0)
Monocytes Absolute: 0.4 10*3/uL (ref 0.1–1.0)
Monocytes Relative: 10 %
Neutro Abs: 2.5 10*3/uL (ref 1.7–7.7)
Neutrophils Relative %: 63 %
Platelets: 96 10*3/uL — ABNORMAL LOW (ref 150–400)
RBC: 3.55 MIL/uL — ABNORMAL LOW (ref 3.87–5.11)
RDW: 14.9 % (ref 11.5–15.5)
WBC: 3.9 10*3/uL — ABNORMAL LOW (ref 4.0–10.5)
nRBC: 0 % (ref 0.0–0.2)

## 2022-12-16 LAB — COMPREHENSIVE METABOLIC PANEL
ALT: 18 U/L (ref 0–44)
AST: 20 U/L (ref 15–41)
Albumin: 3.5 g/dL (ref 3.5–5.0)
Alkaline Phosphatase: 61 U/L (ref 38–126)
Anion gap: 11 (ref 5–15)
BUN: 34 mg/dL — ABNORMAL HIGH (ref 8–23)
CO2: 28 mmol/L (ref 22–32)
Calcium: 9.2 mg/dL (ref 8.9–10.3)
Chloride: 99 mmol/L (ref 98–111)
Creatinine, Ser: 1.74 mg/dL — ABNORMAL HIGH (ref 0.44–1.00)
GFR, Estimated: 28 mL/min — ABNORMAL LOW (ref >60)
Glucose, Bld: 181 mg/dL — ABNORMAL HIGH (ref 70–99)
Potassium: 3.2 mmol/L — ABNORMAL LOW (ref 3.5–5.1)
Sodium: 138 mmol/L (ref 135–145)
Total Bilirubin: 0.6 mg/dL (ref 0.3–1.2)
Total Protein: 7 g/dL (ref 6.5–8.1)

## 2022-12-16 LAB — LIPASE, BLOOD: Lipase: 30 U/L (ref 11–51)

## 2022-12-16 LAB — BRAIN NATRIURETIC PEPTIDE: B Natriuretic Peptide: 842.2 pg/mL — ABNORMAL HIGH (ref 0.0–100.0)

## 2022-12-16 LAB — TROPONIN I (HIGH SENSITIVITY)
Troponin I (High Sensitivity): 57 ng/L — ABNORMAL HIGH (ref <18)
Troponin I (High Sensitivity): 64 ng/L — ABNORMAL HIGH (ref <18)

## 2022-12-16 MED ORDER — BENZONATATE 100 MG PO CAPS
100.0000 mg | ORAL_CAPSULE | Freq: Once | ORAL | Status: AC
  Administered 2022-12-16: 100 mg via ORAL
  Filled 2022-12-16: qty 1

## 2022-12-16 MED ORDER — FUROSEMIDE 10 MG/ML IJ SOLN
80.0000 mg | Freq: Once | INTRAMUSCULAR | Status: AC
  Administered 2022-12-16: 80 mg via INTRAVENOUS
  Filled 2022-12-16: qty 8

## 2022-12-16 MED ORDER — ACETAMINOPHEN 500 MG PO TABS
1000.0000 mg | ORAL_TABLET | Freq: Once | ORAL | Status: AC
  Administered 2022-12-16: 1000 mg via ORAL
  Filled 2022-12-16: qty 2

## 2022-12-16 MED ORDER — BENZONATATE 100 MG PO CAPS: 100.0000 mg | ORAL_CAPSULE | Freq: Three times a day (TID) | ORAL | 0 refills | Status: DC | PRN

## 2022-12-16 NOTE — ED Provider Notes (Signed)
[facility] Provider Note   CSN: [id number] Arrival date & time: 12/16/22  0301     History  Chief Complaint  Patient presents with   Chest Pain    [name] is a 89 y.o. female.  89 year old female with a history of breast cancer, chronic combined systolic and diastolic CHF, CKD, clotting disorder, CVA, diabetes mellitus, LBBB, SVT, pacemaker on Eliquis who presents ER today with right upper abdominal pain.  Seems to radiate down towards her right lower/suprapubic area.  Started tonight.  Not associated nausea, vomiting, diaphoresis, lightheadedness or dyspnea.  Had pneumonia recently and recovered but has been coughing again now with yellow phlegm again.   Chest Pain      Home Medications Prior to Admission medications   Medication Sig Start Date End Date Taking? Authorizing Provider  benzonatate (TESSALON) 100 MG capsule Take 1 capsule (100 mg total) by mouth 3 (three) times daily as needed for cough. 12/16/22  Yes Thai Hemrick, Corene Cornea, MD  acetaminophen (TYLENOL) 650 MG CR tablet Take 650 mg by mouth every 8 (eight) hours.    [provider]  amiodarone (PACERONE) 200 MG tablet Take one tablet by mouth daily Monday-Friday. Do NOT take Saturday and Sunday 06/21/22   Shirley Friar, PA-C  atorvastatin (LIPITOR) 10 MG tablet Take 10 mg by mouth at bedtime.    [provider]  cetirizine (ZYRTEC) 10 MG tablet Take 10 mg by mouth every morning.    [provider]  Docusate Sodium 100 MG capsule Take 100 mg by mouth every morning.    [provider]  Dulaglutide (TRULICITY) 1.5 0000000 SOPN Inject 1.5 mg into the skin every Thursday.    [provider]  ELIQUIS 2.5 MG TABS tablet TAKE 1 TABLET(2.5 MG) BY MOUTH TWICE DAILY Patient taking differently: Take 2.5 mg by mouth 2 (two) times daily. 07/13/20   Dunn, Nedra Hai, PA-C  ferrous sulfate 325 (65 FE) MG tablet Take 325 mg by mouth 3 (three)  times daily with meals.    [provider]  folic acid (FOLVITE) A999333 MCG tablet Take 400 mcg by mouth every morning.    [provider]  insulin glargine (LANTUS) 100 unit/mL SOPN Inject 16 Units into the skin at bedtime. 02/11/22   Elgergawy, Silver Huguenin, MD  lisinopril (ZESTRIL) 5 MG tablet Take 1 tablet (5 mg total) by mouth daily. Patient taking differently: Take 5 mg by mouth every morning. 10/13/20   Charlynne Cousins, MD  Menthol, Topical Analgesic, (BIOFREEZE) 4 % GEL Apply 1 application  topically 3 (three) times daily. For shoulder pain    [provider]  metoprolol succinate (TOPROL-XL) 25 MG 24 hr tablet Take 1 tablet (25 mg total) by mouth daily. Patient taking differently: Take 25 mg by mouth every morning. 07/21/20   Aline August, MD  omeprazole (PRILOSEC OTC) 20 MG tablet Take 20 mg by mouth every morning.    [provider]  oxybutynin (DITROPAN) 5 MG tablet TAKE 1 TABLET(5 MG) BY MOUTH TWICE DAILY Patient taking differently: Take 5 mg by mouth 2 (two) times daily. 12/23/19   [name], MD  polyethylene glycol (MIRALAX / GLYCOLAX) 17 g packet Take 17 g by mouth every morning.    [provider]  potassium chloride (KLOR-CON) 10 MEQ tablet Take 2 tablets (20 mEq total) by mouth daily. 02/11/22   Elgergawy, Silver Huguenin, MD  pregabalin (LYRICA) 50 MG capsule Take 50 mg by  mouth every morning. 06/10/22   [provider]  pregabalin (LYRICA) 75 MG capsule Take 1 capsule (75 mg total) by mouth at bedtime. 06/30/20   Minette Brine, FNP  torsemide (DEMADEX) 20 MG tablet Take 2 tablets by mouth every morning and one tablet every afternoon 12/02/22   Jettie Booze, MD  vitamin B-12 (CYANOCOBALAMIN) 500 MCG tablet Take 500 mcg by mouth every morning.    [provider]  vitamin C (ASCORBIC ACID) 500 MG tablet Take 500 mg by mouth 3 (three) times daily.    [provider]      Allergies    Aspirin, Penicillins, and  Remeron [mirtazapine]    Review of Systems   Review of Systems  Cardiovascular:  Positive for chest pain.    Physical Exam Updated Vital Signs BP 132/66 (BP Location: Right Arm)   Pulse 60   Temp 97.6 F (36.4 C) (Oral)   Resp 11   Ht '5\' 5"'$  (1.651 m)   Wt 90.7 kg   SpO2 100%   BMI 33.28 kg/m  Physical Exam Vitals and nursing note reviewed.  Constitutional:      Appearance: She is well-developed.  HENT:     Head: Normocephalic and atraumatic.  Cardiovascular:     Rate and Rhythm: Normal rate and regular rhythm.  Pulmonary:     Effort: No respiratory distress.     Breath sounds: No stridor.  Abdominal:     General: There is no distension.  Musculoskeletal:     Cervical back: Normal range of motion.  Skin:    General: Skin is warm and dry.  Neurological:     Mental Status: She is alert.     ED Results / Procedures / Treatments   Labs (all labs ordered are listed, but only abnormal results are displayed) Labs Reviewed  CBC WITH DIFFERENTIAL/PLATELET - Abnormal; Notable for the following components:      Result Value   WBC 3.9 (*)    RBC 3.55 (*)    Hemoglobin 10.7 (*)    HCT 33.7 (*)    Platelets 96 (*)    All other components within normal limits  COMPREHENSIVE METABOLIC PANEL - Abnormal; Notable for the following components:   Potassium 3.2 (*)    Glucose, Bld 181 (*)    BUN 34 (*)    Creatinine, Ser 1.74 (*)    GFR, Estimated 28 (*)    All other components within normal limits  BRAIN NATRIURETIC PEPTIDE - Abnormal; Notable for the following components:   B Natriuretic Peptide 842.2 (*)    All other components within normal limits  TROPONIN I (HIGH SENSITIVITY) - Abnormal; Notable for the following components:   Troponin I (High Sensitivity) 57 (*)    All other components within normal limits  TROPONIN I (HIGH SENSITIVITY) - Abnormal; Notable for the following components:   Troponin I (High Sensitivity) 64 (*)    All other components within normal  limits  LIPASE, BLOOD    EKG EKG Interpretation  Date/Time:  Friday December 16 2022 03:14:02 EST Ventricular Rate:  66 PR Interval:  173 QRS Duration: 169 QT Interval:  474 QTC Calculation: 497 R Axis:   -35 Text Interpretation: Sinus rhythm Left bundle branch block Confirmed by Merrily Pew [phone]) on 12/16/2022 5:26:57 AM  Radiology CT Renal Stone Study  Result Date: 12/16/2022 CLINICAL DATA:  89 year old female with flank, epigastric pain. EXAM: CT ABDOMEN AND PELVIS WITHOUT CONTRAST TECHNIQUE: Multidetector CT imaging of the abdomen  and pelvis was performed following the standard protocol without IV contrast. RADIATION DOSE REDUCTION: This exam was performed according to the departmental dose-optimization program which includes automated exposure control, adjustment of the mA and/or kV according to patient size and/or use of iterative reconstruction technique. COMPARISON:  CT Abdomen and Pelvis without contrast 01/02/2019. FINDINGS: Lower chest: Cardiomegaly. Cardiac pacemaker leads. No pericardial effusion. Lung bases are well aerated, there is mild chronic lower lobe atelectasis, scarring. No pleural effusion. Hepatobiliary: Negative noncontrast liver. Evidence of dependent gallbladder sludge, but no evidence of gallbladder inflammation. Pancreas: Negative, p atrophied since 2020. Spleen: Negative. Adrenals/Urinary Tract: Normal adrenal glands. Bilateral nephrolithiasis ranging from punctate to 4 mm (left lower pole). But no hydronephrosis, renal inflammation. Decompressed ureters. Moderate bladder distension, similar to the 2020 CT. Numerous pelvic phleboliths. No evidence of bladder inflammation. Stomach/Bowel: Redundant but mostly decompressed large bowel. There is moderate retained stool in the right colon and at the hepatic flexure. Normal retrocecal appendix (series 3, image 50). No dilated small bowel. Decompressed stomach and duodenum. No free air, free fluid, or mesenteric  inflammation identified. Vascular/Lymphatic: Normal caliber abdominal aorta. Mild Aortoiliac calcified atherosclerosis. Vascular patency is not evaluated in the absence of IV contrast. No lymphadenopathy identified. Reproductive: Surgically absent uterus. Diminutive or absent ovaries. Other: No pelvis free fluid.  Numerous pelvic phleboliths. Musculoskeletal: Chronic severe disc and endplate degeneration in the visible spine. Chronic L2-L3 endplate erosions which are probably degenerative in nature, with extensive intervening vacuum disc there. Chronic L4-L5 spondylolisthesis. No acute or suspicious osseous lesion identified. IMPRESSION: 1. No acute or inflammatory process identified in the non-contrast abdomen or pelvis. Bilateral nephrolithiasis but no obstructive uropathy. 2. Pancreatic atrophy following pancreatitis in 2020. 3. Suspicion of gallbladder sludge but no CT evidence of acute cholecystitis. 4. Cardiomegaly.  Aortic Atherosclerosis (ICD10-I70.0). Electronically Signed   By: Genevie Ann M.D.   On: 12/16/2022 07:11   DG Chest 2 View  Result Date: 12/16/2022 CLINICAL DATA:  Chest pain EXAM: CHEST - 2 VIEW COMPARISON:  None Available. FINDINGS: Mild cardiomegaly. Both lungs are clear. The visualized skeletal structures are unremarkable. Unchanged position of left chest wall pacemaker. IMPRESSION: Mild cardiomegaly. No acute abnormality of the lungs. Electronically Signed   By: Ulyses Jarred M.D.   On: 12/16/2022 03:50    Procedures Procedures    Medications Ordered in ED Medications  furosemide (LASIX) injection 80 mg (has no administration in time range)  benzonatate (TESSALON) capsule 100 mg (has no administration in time range)  acetaminophen (TYLENOL) tablet 1,000 mg (1,000 mg Oral Given 12/16/22 0547)    ED Course/ Medical Decision Making/ A&P                             Medical Decision Making Amount and/or Complexity of Data Reviewed Labs: ordered. Radiology: ordered.  Risk OTC  drugs. Prescription drug management.  As she is having pain close to her chest will evaluate with troponins and EKG for that with a chest x-ray as she recently had pneumonia and a productive cough now.  Seems more abdominal in nature.  Will check her basic labs and see what those look like.  Likely would not of get a CT scan to make sure is nothing acute or emergent going on in her abdomen. BNP slightly elevated which could be related to her cough.  Will give a dose of Lasix here and increase her torsemide for few days at home and try Tessalon.  This could be just a post pneumonia cough versus possibly related to her lisinopril.  I discussed with her if not improving in about 2 weeks to follow-up with her doctor for consideration of medication changes versus further workup/imaging.  She is not hypoxic, tachypneic.  This could also be related to her lower chest and abdominal pain as her CT scan showed a little bit of sludge but no other signs of cholecystitis or other biliary pathology.  Also consider gastritis although not really typical location for that.  Final Clinical Impression(s) / ED Diagnoses Final diagnoses:  Nonspecific chest pain  Epigastric pain  Chronic kidney disease, unspecified CKD stage    Rx / DC Orders ED Discharge Orders          Ordered    benzonatate (TESSALON) 100 MG capsule  3 times daily PRN        12/16/22 0735              Laneya Gasaway, Corene Cornea, MD 12/16/22 [phone]

## 2022-12-16 NOTE — Discharge Instructions (Addendum)
Double your torsemide for the next 3 days to help with possible CHF.   If your cough doesn't improve in the next 2 weeks, consider the lisinopril as a possible cause and speak to your PCP.

## 2022-12-16 NOTE — ED Notes (Signed)
Called [facility]- will transport pt back to [facility] today.

## 2022-12-16 NOTE — ED Triage Notes (Signed)
  Patient BIB EMS for epigastric chest pain that started yesterday around 1500.  Patient states pain goes down to her R side of her chest/abdomen.  Patient states she just recently had pneumonia.  Pain 8/10, pressure.

## 2022-12-16 NOTE — ED Notes (Signed)
Discharge instructions discussed with pt. Verbalized understanding. VSS. No questions or concerns regarding discharge

## 2022-12-21 ENCOUNTER — Ambulatory Visit
Admission: RE | Admit: 2022-12-21 | Discharge: 2022-12-21 | Disposition: A | Discharge status: Home / Self Care | Payer: Medicare Other | Source: Ambulatory Visit | Attending: Adult Health | Admitting: Adult Health

## 2022-12-21 DIAGNOSIS — C50311 Malignant neoplasm of lower-inner quadrant of right female breast: Secondary | ICD-10-CM

## 2022-12-29 ENCOUNTER — Ambulatory Visit (HOSPITAL_COMMUNITY): Payer: Medicare Other | Attending: Internal Medicine

## 2022-12-29 DIAGNOSIS — I5032 Chronic diastolic (congestive) heart failure: Secondary | ICD-10-CM

## 2022-12-29 LAB — ECHOCARDIOGRAM COMPLETE
Area-P 1/2: 4.68 cm2
Calc EF: 48.1 %
MV M vel: 5.32 m/s
MV Peak grad: 113.2 mmHg
S' Lateral: 3.2 cm
Single Plane A2C EF: 52.7 %
Single Plane A4C EF: 40.5 %

## 2023-02-02 ENCOUNTER — Ambulatory Visit (INDEPENDENT_AMBULATORY_CARE_PROVIDER_SITE_OTHER): Payer: Medicare Other

## 2023-02-02 DIAGNOSIS — I440 Atrioventricular block, first degree: Secondary | ICD-10-CM | POA: Diagnosis not present

## 2023-02-02 LAB — CUP PACEART REMOTE DEVICE CHECK
Battery Voltage: 70
Date Time Interrogation Session: 20240411081928
Implantable Lead Connection Status: 753985
Implantable Lead Connection Status: 753985
Implantable Lead Implant Date: 20200624
Implantable Lead Implant Date: 20200624
Implantable Lead Location: 753859
Implantable Lead Location: 753860
Implantable Lead Model: 377
Implantable Lead Model: 377
Implantable Lead Serial Number: 81195510
Implantable Pulse Generator Implant Date: 20200624
Pulse Gen Model: 407145
Pulse Gen Serial Number: 69638782

## 2023-02-06 NOTE — Progress Notes (Signed)
Patient Care Team: Pcp, No as PCP - General Corky Crafts, MD as PCP - Cardiology (Cardiology) Marinus Maw, MD as PCP - Electrophysiology (Cardiology) Glenna Fellows, MD (Inactive) as Consulting Physician (General Surgery) Serena Croissant, MD as Consulting Physician (Hematology and Oncology) Dorothy Puffer, MD as Consulting Physician (Radiation Oncology)  DIAGNOSIS: No diagnosis found.  SUMMARY OF ONCOLOGIC HISTORY: Oncology History  Malignant neoplasm of lower-inner quadrant of right breast of female, estrogen receptor positive  11/19/2018 Initial Diagnosis   Palpable right breast mass with calcifications, mammogram revealed focal asymmetry lower right breast 5:00 measuring 4.6 cm, ultrasound revealed overall size of 5.7 cm ultrasound-guided biopsy revealed grade 2 IDC ER 90% PR 80% Ki-67 5%, HER-2 negative, T3N0 stage IIa clinical stage   11/28/2018 Cancer Staging   Staging form: Breast, AJCC 8th Edition - Clinical: Stage IIA (cT3, cN0, cM0, G2, ER+, PR+, HER2-) - Signed by Serena Croissant, MD on 11/28/2018   11/28/2018 -  Neo-Adjuvant Anti-estrogen oral therapy   Anastrozole daily, no surgery planned due to comorbidities     CHIEF COMPLIANT:   INTERVAL HISTORY: ALEIYA KISTNER is a   ALLERGIES:  is allergic to aspirin, penicillins, and remeron [mirtazapine].  MEDICATIONS:  Current Outpatient Medications  Medication Sig Dispense Refill   acetaminophen (TYLENOL) 650 MG CR tablet Take 650 mg by mouth every 8 (eight) hours.     amiodarone (PACERONE) 200 MG tablet Take one tablet by mouth daily Monday-Friday. Do NOT take Saturday and Sunday 90 tablet 3   atorvastatin (LIPITOR) 10 MG tablet Take 10 mg by mouth at bedtime.     benzonatate (TESSALON) 100 MG capsule Take 1 capsule (100 mg total) by mouth 3 (three) times daily as needed for cough. 21 capsule 0   cetirizine (ZYRTEC) 10 MG tablet Take 10 mg by mouth every morning.     Docusate Sodium 100 MG capsule Take 100 mg by  mouth every morning.     Dulaglutide (TRULICITY) 1.5 MG/0.5ML SOPN Inject 1.5 mg into the skin every Thursday.     ELIQUIS 2.5 MG TABS tablet TAKE 1 TABLET(2.5 MG) BY MOUTH TWICE DAILY (Patient taking differently: Take 2.5 mg by mouth 2 (two) times daily.) 60 tablet 2   ferrous sulfate 325 (65 FE) MG tablet Take 325 mg by mouth 3 (three) times daily with meals.     folic acid (FOLVITE) 400 MCG tablet Take 400 mcg by mouth every morning.     insulin glargine (LANTUS) 100 unit/mL SOPN Inject 16 Units into the skin at bedtime. 15 mL 11   lisinopril (ZESTRIL) 5 MG tablet Take 1 tablet (5 mg total) by mouth daily. (Patient taking differently: Take 5 mg by mouth every morning.) 30 tablet 0   Menthol, Topical Analgesic, (BIOFREEZE) 4 % GEL Apply 1 application  topically 3 (three) times daily. For shoulder pain     metoprolol succinate (TOPROL-XL) 25 MG 24 hr tablet Take 1 tablet (25 mg total) by mouth daily. (Patient taking differently: Take 25 mg by mouth every morning.) 30 tablet 0   omeprazole (PRILOSEC OTC) 20 MG tablet Take 20 mg by mouth every morning.     oxybutynin (DITROPAN) 5 MG tablet TAKE 1 TABLET(5 MG) BY MOUTH TWICE DAILY (Patient taking differently: Take 5 mg by mouth 2 (two) times daily.) 180 tablet 1   polyethylene glycol (MIRALAX / GLYCOLAX) 17 g packet Take 17 g by mouth every morning.     potassium chloride (KLOR-CON) 10 MEQ tablet Take 2  tablets (20 mEq total) by mouth daily.     pregabalin (LYRICA) 50 MG capsule Take 50 mg by mouth every morning.     pregabalin (LYRICA) 75 MG capsule Take 1 capsule (75 mg total) by mouth at bedtime. 90 capsule 1   torsemide (DEMADEX) 20 MG tablet Take 2 tablets by mouth every morning and one tablet every afternoon 270 tablet 3   vitamin B-12 (CYANOCOBALAMIN) 500 MCG tablet Take 500 mcg by mouth every morning.     vitamin C (ASCORBIC ACID) 500 MG tablet Take 500 mg by mouth 3 (three) times daily.     No current facility-administered medications for  this visit.    PHYSICAL EXAMINATION: ECOG PERFORMANCE STATUS: {CHL ONC ECOG [phone]}  There were no vitals filed for this visit. There were no vitals filed for this visit.  BREAST:*** No palpable masses or nodules in either right or left breasts. No palpable axillary supraclavicular or infraclavicular adenopathy no breast tenderness or nipple discharge. (exam performed in the presence of a chaperone)  LABORATORY DATA:  I have reviewed the data as listed    Latest Ref Rng & Units 12/16/2022    3:34 AM 06/21/2022    9:50 AM 02/11/2022   12:44 AM  CMP  Glucose 70 - 99 mg/dL 426  834  196   BUN 8 - 23 mg/dL 34  36  12   Creatinine 0.44 - 1.00 mg/dL 2.22  9.79  8.92   Sodium 135 - 145 mmol/L 138  147  143   Potassium 3.5 - 5.1 mmol/L 3.2  4.5  3.6   Chloride 98 - 111 mmol/L 99  106  112   CO2 22 - 32 mmol/L 28  29  24    Calcium 8.9 - 10.3 mg/dL 9.2  9.1  8.5   Total Protein 6.5 - 8.1 g/dL 7.0  6.9  5.8   Total Bilirubin 0.3 - 1.2 mg/dL 0.6  0.3  0.6   Alkaline Phos 38 - 126 U/L 61  96  70   AST 15 - 41 U/L 20  21  24    ALT 0 - 44 U/L 18  14  22      Lab Results  Component Value Date   WBC 3.9 (L) 12/16/2022   HGB 10.7 (L) 12/16/2022   HCT 33.7 (L) 12/16/2022   MCV 94.9 12/16/2022   PLT 96 (L) 12/16/2022   NEUTROABS 2.5 12/16/2022    ASSESSMENT & PLAN:  No problem-specific Assessment & Plan notes found for this encounter.    No orders of the defined types were placed in this encounter.  The patient has a good understanding of the overall plan. she agrees with it. she will call with any problems that may develop before the next visit here. Total time spent: 30 mins including face to face time and time spent for planning, charting and co-ordination of care   Sherlyn Lick, CMA 02/06/23    I Janan Ridge am acting as a Neurosurgeon for [employer]  ***

## 2023-02-07 ENCOUNTER — Other Ambulatory Visit: Payer: Self-pay

## 2023-02-07 ENCOUNTER — Inpatient Hospital Stay: Payer: Medicare Other | Attending: Hematology and Oncology | Admitting: Hematology and Oncology

## 2023-02-07 VITALS — BP 145/61 | HR 65 | Temp 97.9°F | Resp 18 | Ht 65.0 in | Wt 198.3 lb

## 2023-02-07 DIAGNOSIS — Z170 Estrogen receptor positive status [ER+]: Secondary | ICD-10-CM | POA: Diagnosis not present

## 2023-02-07 DIAGNOSIS — C50311 Malignant neoplasm of lower-inner quadrant of right female breast: Secondary | ICD-10-CM | POA: Diagnosis present

## 2023-02-07 DIAGNOSIS — Z79811 Long term (current) use of aromatase inhibitors: Secondary | ICD-10-CM | POA: Diagnosis not present

## 2023-02-07 NOTE — Assessment & Plan Note (Addendum)
11/19/2018:Palpable right breast mass with calcifications, mammogram revealed focal asymmetry lower right breast 5:00 measuring 4.6 cm, ultrasound revealed overall size of 5.7 cm ultrasound-guided biopsy revealed grade 2 IDC ER 90% PR 80% Ki-67 5%, HER-2 negative, T3N0 stage IIa clinical stage   Treatment plan: 1.  Neoadjuvant antiestrogen therapy with anastrozole started 11/28/2018-02/21/2022 2. not planning on surgery because of her health and co morbidities     Breast cancer surveillance: 1.  Mammogram 12/21/2022: No evidence of progression of the right breast cancer.   Multiple health issues including heart disease, chronic kidney disease and lung disease  Since patient is doing extremely well without antiestrogen therapy, we decided to watch and monitor and recheck her back in 1 year for follow-up.  She tells me that she is no longer with hospice care.  She is celebrating her 35th birthday in a few days.

## 2023-02-28 LAB — LAB REPORT - SCANNED: EGFR: 27

## 2023-03-09 NOTE — Progress Notes (Signed)
Remote pacemaker transmission.   

## 2023-04-09 NOTE — Progress Notes (Unsigned)
Cardiology Office Note   Date:  04/10/2023   ID:  [name], DOB [birth year], MRN [id number]  PCP:  Pcp, No    No chief complaint on file.  AFib  Wt Readings from Last 3 Encounters:  04/10/23 199 lb (90.3 kg)  02/07/23 198 lb 4.8 oz (89.9 kg)  12/16/22 200 lb (90.7 kg)       History of Present Illness: [name] is a 89 y.o. female  who has had diastolic dysfunction in the past. She is on Coumadin for DVT. She has had atrial arrhythmias and chronic LE edema.   In addition to the history of h/o DVT (on Coumadin for this), chronic diastolic CHF, she also has deconditioning, obesity, HTN, SVT (correlated with pre-syncope, on amiodarone), DM, chronic anemia/thrombocytopenia, LBBB, probable CKD stage III (Cr 1.3 in 08/2014), mod MR by echo 2014 who presents for f/u. Per notes in 2013 she had an EPS with ablation for SVT which did not eliminate the SVT completely. She also had bradycardia which limited medication. She was placed on amiodarone by Dr. Ladona Ridgel. Last echo 07/2013: mild LVH, moderate focal basal hypertrophy of septum, EF 55-60%, grade 1 DD, mod MR, mod LAE, PASP 40.   She sustained a mechanical fall in 07/2015 and was in the hospital for a week.   She was seen in [facility] 2017 for fluid overload.  Lasix was increased and potassium was increased.    In 2018, there was a concern regarding stroke as she developed some stuttering and swallowing difficulties.  Her workup has been unrevealing at this time.  We checked carotid Doppler and echocardiogram.   Prior records show: "She followed with nephrology and cardiology in [place] and also has established care with [facility] as well. There are records in Epic but these only appear to be patient discharge instructions rather than provider notes.   She was admitted 06/08/2019 with shortness of breath and arm pain, found to have acute on chronic diastolic CHF requiring diuresis. Her troponin was elevated to a peak  of 310, felt due to demand ischemia in absence of true angina. Left arm pain was felt MSK, supported by plain films. No further ischemic workup was recommended. Age adjusted D-dimer was felt wnl. Per Dr. Ladona Ridgel, device appeared to be working normally and did not need it interrogated during admission but routine EP f/u was recommended. 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 Per Dr. Lindaann Slough note, "In the absence of fever or sign of sepsis I would not pursue further work-up at this point given the patient age and comorbidities.  I would consider TEE once any signs of infection." She recommended to restart oral Bumex after a 1 day holiday from diuresis. F/u Cr was still elevated on f/u labs 8/31 so lisinopril was discontinued. F/u BMET 06/28/19 showed K 4.5, Cr 1.87 (previous range variable from 1.4-2.3). Otherwise recent labs 05/2019 showed Hgb 8.9 (variable in 9-10 range this year), Plt 105 (variable in 2020), WBC 2.4, A1C 7.9, 07/2018 LDL 96.    She had a pacer placed in [place], and has f/u in our PM clinic scheduled. There was a concern for a lead infection.  She was seen by Dr. Ladona Ridgel.  Pacer was functioning normally.  Due to lack of symptoms of infection, no TEE was done.   She was diagnosed with  breast cancer and the plan was for her to have surgery in [place].  Ultimately, she did not have surgery and is being treated with anastrozole.  There was a concern about putting her to sleep.     Records from 12/21 hospital admission show: " Covid 19 infection on 06/12/2020 comes into the hospital for shortness of breath that started 3 days prior to admission, in the ED he was found to have a temperature of 100.5 breathing 20 times per minute with a white count of 4 hemoglobin of 10 lactic acid of 1.4 chest x-ray showed new infiltrates in both  lung fields influenza and SARS-CoV-2 PCR were negative, he was started on IV Lasix Rocephin and doxy.  Empiric antibiotics were discontinued she was started on aggressive IV diuresis she is diuresed about 3 L and her breathing is improving along with her weight which is coming down."   1/22 pacer check showed: "Alert for AF burden 100% on 11/12/20, Elevated to > 255 since 09/30/20. Patient just d/ced from hospital for CHF exacerbation 10/13/20. Spoke with [name](daughter) per DPR, and she reports patient has been eating high sodium foods and drinking increased amount o liquids sine returning to long term care facility. She reports increased BLE edema. Daughter concerned that nursing facility does not have patient on fluid restriction or low NA diet."     She had a visit in Jan 2022 from a nursing home:  "Wants to have her orders allow for 1 extra torsemide pill, 20 mg as needed for swelling"  Diagnosed with [facility] in 2020 (Dr. 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  Neoadjuvant antiestrogen therapy with anastrozole started 11/28/2018-02/21/2022 2. not planning on surgery because of her health and co morbidities"    Past Medical History:  Diagnosis Date   Abnormal echocardiogram    a. possible mass on echo 05/2019 on PPM, b. 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 Previously diastolic but patient AVS from outside hospital listed systolic CHF and cardiomyopathy, records pending.   CKD (chronic kidney disease), stage III (HCC)    Clotting disorder (HCC)    CVA (cerebral vascular accident) (HCC)    residual right sided weakness and mild dysphagia    Diabetes mellitus (HCC)    Dizziness and giddiness    DVT (deep venous thrombosis) (HCC)    a. on anticoagulation for this.   Essential hypertension    LBBB (left bundle branch block)    Mitral regurgitation    a. Mod MR by echo 2014.   Muscular deconditioning    Obesity    Pacemaker    Pericardial effusion    SVT (supraventricular tachycardia)    a. In 2013 she had an EPS with ablation for SVT which did not eliminate the SVT completely. She also had bradycardia which limited medication. She was placed on amiodarone by Dr. Ladona Ridgel.   Thrombocytopenia (HCC) 11/21/2011    Past Surgical History:  Procedure Laterality Date   BREAST BIOPSY Right 11/19/2018   positive   ESOPHAGOGASTRODUODENOSCOPY (EGD) WITH PROPOFOL N/A 02/10/2022   Procedure: ESOPHAGOGASTRODUODENOSCOPY (EGD) WITH PROPOFOL;  Surgeon: Jeani Hawking, MD;  Location: [facility] ENDOSCOPY;  Service: Gastroenterology;  Laterality: N/A;   EYE SURGERY     SUPRAVENTRICULAR TACHYCARDIA ABLATION N/A 10/11/2012   Procedure: SUPRAVENTRICULAR TACHYCARDIA ABLATION;  Surgeon: Marinus Maw, MD;  Location: [facility] CATH LAB;  Service: Cardiovascular;  Laterality: N/A;     Current Outpatient Medications  Medication Sig Dispense Refill   acetaminophen (TYLENOL) 650 MG CR tablet Take 650 mg by mouth every 8 (eight) hours.     amiodarone (PACERONE) 200 MG tablet Take one tablet by mouth daily Monday-Friday. Do NOT take Saturday and Sunday 90 tablet 3   amLODipine (NORVASC) 5 MG tablet Take 5 mg by mouth daily.     Docusate Sodium 100 MG capsule Take 100 mg by mouth every morning.     ELIQUIS 2.5 MG TABS tablet TAKE 1 TABLET(2.5 MG) BY MOUTH TWICE DAILY (Patient taking differently: Take 2.5 mg by mouth 2 (two) times daily.) 60 tablet 2   escitalopram (LEXAPRO) 5 MG tablet Take 5 mg by mouth daily.     guaifenesin (HUMIBID E) 400 MG TABS tablet Take 400 mg by mouth. Take 1 1/2 tablets by mouth at bedtime     insulin glargine (LANTUS) 100 unit/mL SOPN  Inject 16 Units into the skin at bedtime. (Patient taking differently: Inject 23 Units into the skin at bedtime.) 15 mL 11   loratadine (CLARITIN) 10 MG tablet Take 10 mg by mouth daily.     metoprolol succinate (TOPROL-XL) 25 MG 24 hr tablet Take 1 tablet (25 mg total) by mouth daily. (Patient taking differently: Take 25 mg by mouth every morning.) 30 tablet 0   omeprazole (PRILOSEC OTC) 20 MG tablet Take 20 mg by mouth every morning.     oxybutynin (DITROPAN) 5 MG tablet TAKE 1 TABLET(5 MG) BY MOUTH TWICE DAILY (Patient taking differently: Take 5 mg by mouth daily.) 180 tablet 1   potassium chloride (KLOR-CON) 10 MEQ tablet Take 2 tablets (20 mEq total) by mouth daily.     pregabalin (LYRICA) 50 MG capsule Take 50 mg by mouth every morning.     pregabalin (LYRICA) 75 MG capsule Take 1 capsule (75 mg total) by mouth at bedtime. 90 capsule 1   torsemide (DEMADEX) 20 MG tablet Take 2 tablets by mouth every morning and one tablet every afternoon 270 tablet 3   TRULICITY 3 MG/0.5ML SOPN Inject into the skin.     atorvastatin (LIPITOR) 10 MG tablet Take 10 mg by mouth at bedtime. (Patient not taking: Reported on 04/10/2023)     benzonatate (TESSALON) 100 MG capsule Take 1 capsule (100 mg total) by mouth 3 (three) times daily as needed for cough. (Patient not taking: Reported on 04/10/2023) 21 capsule 0   cetirizine (ZYRTEC) 10 MG tablet Take 10 mg by mouth every morning. (Patient not taking: Reported on 04/10/2023)     Dulaglutide (TRULICITY) 1.5 MG/0.5ML SOPN Inject 1.5 mg into the skin every Thursday. (Patient not taking: Reported on 04/10/2023)     ferrous sulfate 325 (65 FE) MG tablet Take 325 mg by mouth 3 (three) times daily with meals. (Patient not taking: Reported on 04/10/2023)     folic acid (FOLVITE) 400 MCG tablet Take 400 mcg by mouth every morning. (Patient not taking: Reported on 04/10/2023)     lisinopril (ZESTRIL) 5 MG tablet Take 1 tablet (5 mg total) by mouth daily. (Patient not taking:  Reported on 04/10/2023) 30 tablet 0   Menthol, Topical Analgesic, (BIOFREEZE) 4 % GEL  Apply 1 application  topically 3 (three) times daily. For shoulder pain (Patient not taking: Reported on 04/10/2023)     polyethylene glycol (MIRALAX / GLYCOLAX) 17 g packet Take 17 g by mouth every morning. (Patient not taking: Reported on 04/10/2023)     vitamin B-12 (CYANOCOBALAMIN) 500 MCG tablet Take 500 mcg by mouth every morning. (Patient not taking: Reported on 04/10/2023)     vitamin C (ASCORBIC ACID) 500 MG tablet Take 500 mg by mouth 3 (three) times daily. (Patient not taking: Reported on 04/10/2023)     No current facility-administered medications for this visit.    Allergies:   Aspirin, Penicillins, and Remeron [mirtazapine]    Social History:  The patient  reports that she has never smoked. She has never used smokeless tobacco. She reports that she does not drink alcohol and does not use drugs.   Family History:  The patient's family history includes Breast cancer in her maternal aunt; Cancer in her father; Heart attack in her brother; Hypertension in her brother, daughter, and sister; Stroke in her mother and sister.    ROS:  Please see the history of present illness.   Otherwise, review of systems are positive for swallowing problems.   All other systems are reviewed and negative.    PHYSICAL EXAM: VS:  BP 128/68   Pulse 60   Ht 5\' 5"  (1.651 m)   Wt 199 lb (90.3 kg)   BMI 33.12 kg/m  , BMI Body mass index is 33.12 kg/m. GEN: Well nourished, well developed, in no acute distress HEENT: normal Neck: no JVD, carotid bruits, or masses Cardiac: RRR; no murmurs, rubs, or gallops,no edema  Respiratory:  clear to auscultation bilaterally, normal work of breathing GI: soft, nontender, nondistended, + BS MS: no deformity or atrophy Skin: warm and dry, no rash Neuro:  Strength and sensation are intact Psych: euthymic mood, full affect   EKG:   The ekg ordered 11/2022 demonstrates atrial  flutter, rate controlled   Recent Labs: 06/21/2022: TSH 1.730 12/16/2022: ALT 18; B Natriuretic Peptide 842.2; BUN 34; Creatinine, Ser 1.74; Hemoglobin 10.7; Platelets 96; Potassium 3.2; Sodium 138   Lipid Panel    Component Value Date/Time   CHOL 195 08/15/2018 1236   TRIG 56 01/02/2019 1425   HDL 81 08/15/2018 1236   CHOLHDL 2.4 08/15/2018 1236   CHOLHDL 2.6 12/11/2008 0130   VLDL 13 12/11/2008 0130   LDLCALC 96 08/15/2018 1236     Other studies Reviewed: Additional studies/ records that were reviewed today with results demonstrating: EF 45-50%.   ASSESSMENT AND PLAN:  Chronic combined systolic/diastolic heart failure: Most recent ejection fraction in 2024 was 45 to 50%.  Appears euvolemic.  Currently taking torsemide 40 mg in the morning and 20 mg in the afternoon.We will change the afternoon dose to allow for an extra 20 mg if requested for leg swelling or increased shortness of breath. Hypertension: Low-salt diet. Atrial fibrillation: Anticoagulated with Eliquis for stroke prevention.  Low-dose Eliquis due to stage III chronic kidney disease. SVT: Followed by EP. Pacemaker: Followed by device clinic. Type 2 DM:CKD noted as well.  Cr 1.8.  Stay hydrated.  Avoid nephrotoxins.   Current medicines are reviewed at length with the patient today.  The patient concerns regarding her medicines were addressed.  The following changes have been made:  No change  Labs/ tests ordered today include:  No orders of the defined types were placed in this encounter.   Recommend 150 minutes/week of aerobic  exercise Low fat, low carb, high fiber diet recommended  Disposition:   FU in 1 year   Signed, Lance Muss, MD  04/10/2023 10:50 AM    [facility] [address] [place], [place], [place]  [address] Phone: [phone]; Fax: [phone]

## 2023-04-10 ENCOUNTER — Encounter: Payer: Self-pay | Admitting: Interventional Cardiology

## 2023-04-10 ENCOUNTER — Ambulatory Visit: Payer: Medicare Other | Attending: Interventional Cardiology | Admitting: Interventional Cardiology

## 2023-04-10 VITALS — BP 128/68 | HR 60 | Ht 65.0 in | Wt 199.0 lb

## 2023-04-10 DIAGNOSIS — Z950 Presence of cardiac pacemaker: Secondary | ICD-10-CM

## 2023-04-10 DIAGNOSIS — Z794 Long term (current) use of insulin: Secondary | ICD-10-CM

## 2023-04-10 DIAGNOSIS — I5032 Chronic diastolic (congestive) heart failure: Secondary | ICD-10-CM

## 2023-04-10 DIAGNOSIS — I10 Essential (primary) hypertension: Secondary | ICD-10-CM | POA: Diagnosis not present

## 2023-04-10 DIAGNOSIS — N183 Chronic kidney disease, stage 3 unspecified: Secondary | ICD-10-CM

## 2023-04-10 DIAGNOSIS — I4821 Permanent atrial fibrillation: Secondary | ICD-10-CM | POA: Diagnosis not present

## 2023-04-10 DIAGNOSIS — Z7901 Long term (current) use of anticoagulants: Secondary | ICD-10-CM

## 2023-04-10 DIAGNOSIS — I471 Supraventricular tachycardia, unspecified: Secondary | ICD-10-CM | POA: Diagnosis not present

## 2023-04-10 DIAGNOSIS — E1122 Type 2 diabetes mellitus with diabetic chronic kidney disease: Secondary | ICD-10-CM

## 2023-04-10 MED ORDER — TORSEMIDE 20 MG PO TABS: ORAL_TABLET | ORAL | 3 refills | Status: DC

## 2023-04-10 NOTE — Patient Instructions (Addendum)
Medication Instructions:  Your physician has recommended you make the following change in your medication:  Continue current dose of Torsemide in the AM (40 mg daily).  Can take 20 -40 mg torsemide daily in the afternoon as needed depending on swelling and shortness of breath  *If you need a refill on your cardiac medications before your next appointment, please call your pharmacy*   Lab Work: none If you have labs (blood work) drawn today and your tests are completely normal, you will receive your results only by: MyChart Message (if you have MyChart) OR A paper copy in the mail If you have any lab test that is abnormal or we need to change your treatment, we will call you to review the results.   Testing/Procedures: none   Follow-Up: At [facility], you and your health needs are our priority.  As part of our continuing mission to provide you with exceptional heart care, we have created designated Provider Care Teams.  These Care Teams include your primary Cardiologist (physician) and Advanced Practice Providers (APPs -  Physician Assistants and Nurse Practitioners) who all work together to provide you with the care you need, when you need it.  We recommend signing up for the patient portal called "MyChart".  Sign up information is provided on this After Visit Summary.  MyChart is used to connect with patients for Virtual Visits (Telemedicine).  Patients are able to view lab/test results, encounter notes, upcoming appointments, etc.  Non-urgent messages can be sent to your provider as well.   To learn more about what you can do with MyChart, go to [email or web address].    Your next appointment:   6 month(s)  Provider:   Lance Muss, MD     Other Instructions

## 2023-05-04 ENCOUNTER — Ambulatory Visit (INDEPENDENT_AMBULATORY_CARE_PROVIDER_SITE_OTHER): Payer: Medicare Other

## 2023-05-04 DIAGNOSIS — I440 Atrioventricular block, first degree: Secondary | ICD-10-CM

## 2023-05-04 LAB — CUP PACEART REMOTE DEVICE CHECK
Battery Voltage: 70
Date Time Interrogation Session: 20240711092335
Implantable Lead Connection Status: 753985
Implantable Lead Connection Status: 753985
Implantable Lead Implant Date: 20200624
Implantable Lead Implant Date: 20200624
Implantable Lead Location: 753859
Implantable Lead Location: 753860
Implantable Lead Model: 377
Implantable Lead Model: 377
Implantable Lead Serial Number: 81195510
Implantable Pulse Generator Implant Date: 20200624
Pulse Gen Model: 407145
Pulse Gen Serial Number: 69638782

## 2023-05-23 NOTE — Progress Notes (Signed)
Remote pacemaker transmission.   

## 2023-06-03 ENCOUNTER — Observation Stay (HOSPITAL_COMMUNITY): Payer: Medicare Other

## 2023-06-03 ENCOUNTER — Inpatient Hospital Stay (HOSPITAL_COMMUNITY)
Admission: EM | Admit: 2023-06-03 | Discharge: 2023-06-05 | DRG: 682 | Disposition: A | Discharge status: Skilled Nursing Facility | Payer: Medicare Other | Source: Skilled Nursing Facility | Attending: Internal Medicine | Admitting: Internal Medicine

## 2023-06-03 ENCOUNTER — Other Ambulatory Visit: Payer: Self-pay

## 2023-06-03 ENCOUNTER — Encounter (HOSPITAL_COMMUNITY): Payer: Self-pay

## 2023-06-03 ENCOUNTER — Emergency Department (HOSPITAL_COMMUNITY): Payer: Medicare Other

## 2023-06-03 DIAGNOSIS — I674 Hypertensive encephalopathy: Secondary | ICD-10-CM | POA: Diagnosis present

## 2023-06-03 DIAGNOSIS — Z886 Allergy status to analgesic agent status: Secondary | ICD-10-CM

## 2023-06-03 DIAGNOSIS — D65 Disseminated intravascular coagulation [defibrination syndrome]: Secondary | ICD-10-CM | POA: Diagnosis present

## 2023-06-03 DIAGNOSIS — E1122 Type 2 diabetes mellitus with diabetic chronic kidney disease: Secondary | ICD-10-CM | POA: Diagnosis present

## 2023-06-03 DIAGNOSIS — I130 Hypertensive heart and chronic kidney disease with heart failure and stage 1 through stage 4 chronic kidney disease, or unspecified chronic kidney disease: Secondary | ICD-10-CM | POA: Diagnosis present

## 2023-06-03 DIAGNOSIS — Z993 Dependence on wheelchair: Secondary | ICD-10-CM

## 2023-06-03 DIAGNOSIS — E1165 Type 2 diabetes mellitus with hyperglycemia: Secondary | ICD-10-CM | POA: Diagnosis present

## 2023-06-03 DIAGNOSIS — E860 Dehydration: Secondary | ICD-10-CM | POA: Diagnosis present

## 2023-06-03 DIAGNOSIS — R253 Fasciculation: Secondary | ICD-10-CM | POA: Diagnosis present

## 2023-06-03 DIAGNOSIS — N179 Acute kidney failure, unspecified: Principal | ICD-10-CM | POA: Diagnosis present

## 2023-06-03 DIAGNOSIS — E872 Acidosis, unspecified: Secondary | ICD-10-CM | POA: Diagnosis present

## 2023-06-03 DIAGNOSIS — Z950 Presence of cardiac pacemaker: Secondary | ICD-10-CM

## 2023-06-03 DIAGNOSIS — E871 Hypo-osmolality and hyponatremia: Secondary | ICD-10-CM | POA: Diagnosis present

## 2023-06-03 DIAGNOSIS — Z794 Long term (current) use of insulin: Secondary | ICD-10-CM

## 2023-06-03 DIAGNOSIS — Z79899 Other long term (current) drug therapy: Secondary | ICD-10-CM

## 2023-06-03 DIAGNOSIS — E1169 Type 2 diabetes mellitus with other specified complication: Secondary | ICD-10-CM

## 2023-06-03 DIAGNOSIS — E870 Hyperosmolality and hypernatremia: Secondary | ICD-10-CM | POA: Diagnosis present

## 2023-06-03 DIAGNOSIS — E039 Hypothyroidism, unspecified: Secondary | ICD-10-CM | POA: Diagnosis present

## 2023-06-03 DIAGNOSIS — E0590 Thyrotoxicosis, unspecified without thyrotoxic crisis or storm: Secondary | ICD-10-CM | POA: Diagnosis present

## 2023-06-03 DIAGNOSIS — I82409 Acute embolism and thrombosis of unspecified deep veins of unspecified lower extremity: Secondary | ICD-10-CM | POA: Diagnosis present

## 2023-06-03 DIAGNOSIS — R569 Unspecified convulsions: Secondary | ICD-10-CM | POA: Diagnosis not present

## 2023-06-03 DIAGNOSIS — I480 Paroxysmal atrial fibrillation: Secondary | ICD-10-CM

## 2023-06-03 DIAGNOSIS — Z888 Allergy status to other drugs, medicaments and biological substances status: Secondary | ICD-10-CM

## 2023-06-03 DIAGNOSIS — I69322 Dysarthria following cerebral infarction: Secondary | ICD-10-CM

## 2023-06-03 DIAGNOSIS — Z66 Do not resuscitate: Secondary | ICD-10-CM | POA: Diagnosis present

## 2023-06-03 DIAGNOSIS — I69351 Hemiplegia and hemiparesis following cerebral infarction affecting right dominant side: Secondary | ICD-10-CM

## 2023-06-03 DIAGNOSIS — Z8249 Family history of ischemic heart disease and other diseases of the circulatory system: Secondary | ICD-10-CM

## 2023-06-03 DIAGNOSIS — R4781 Slurred speech: Secondary | ICD-10-CM | POA: Diagnosis present

## 2023-06-03 DIAGNOSIS — T383X6A Underdosing of insulin and oral hypoglycemic [antidiabetic] drugs, initial encounter: Secondary | ICD-10-CM | POA: Diagnosis present

## 2023-06-03 DIAGNOSIS — I160 Hypertensive urgency: Secondary | ICD-10-CM | POA: Diagnosis present

## 2023-06-03 DIAGNOSIS — I10 Essential (primary) hypertension: Secondary | ICD-10-CM | POA: Diagnosis present

## 2023-06-03 DIAGNOSIS — Z7985 Long-term (current) use of injectable non-insulin antidiabetic drugs: Secondary | ICD-10-CM

## 2023-06-03 DIAGNOSIS — L89152 Pressure ulcer of sacral region, stage 2: Secondary | ICD-10-CM | POA: Diagnosis present

## 2023-06-03 DIAGNOSIS — D496 Neoplasm of unspecified behavior of brain: Secondary | ICD-10-CM | POA: Diagnosis present

## 2023-06-03 DIAGNOSIS — Z86718 Personal history of other venous thrombosis and embolism: Secondary | ICD-10-CM

## 2023-06-03 DIAGNOSIS — F0393 Unspecified dementia, unspecified severity, with mood disturbance: Secondary | ICD-10-CM | POA: Diagnosis present

## 2023-06-03 DIAGNOSIS — F05 Delirium due to known physiological condition: Secondary | ICD-10-CM | POA: Diagnosis present

## 2023-06-03 DIAGNOSIS — I5042 Chronic combined systolic (congestive) and diastolic (congestive) heart failure: Secondary | ICD-10-CM | POA: Diagnosis present

## 2023-06-03 DIAGNOSIS — Z7901 Long term (current) use of anticoagulants: Secondary | ICD-10-CM

## 2023-06-03 DIAGNOSIS — F32A Depression, unspecified: Secondary | ICD-10-CM | POA: Diagnosis present

## 2023-06-03 DIAGNOSIS — I69320 Aphasia following cerebral infarction: Secondary | ICD-10-CM

## 2023-06-03 DIAGNOSIS — Z823 Family history of stroke: Secondary | ICD-10-CM

## 2023-06-03 DIAGNOSIS — E1140 Type 2 diabetes mellitus with diabetic neuropathy, unspecified: Secondary | ICD-10-CM | POA: Diagnosis present

## 2023-06-03 DIAGNOSIS — K219 Gastro-esophageal reflux disease without esophagitis: Secondary | ICD-10-CM | POA: Diagnosis present

## 2023-06-03 DIAGNOSIS — E119 Type 2 diabetes mellitus without complications: Secondary | ICD-10-CM

## 2023-06-03 DIAGNOSIS — I5040 Unspecified combined systolic (congestive) and diastolic (congestive) heart failure: Secondary | ICD-10-CM

## 2023-06-03 DIAGNOSIS — Z803 Family history of malignant neoplasm of breast: Secondary | ICD-10-CM

## 2023-06-03 DIAGNOSIS — F209 Schizophrenia, unspecified: Secondary | ICD-10-CM | POA: Diagnosis present

## 2023-06-03 DIAGNOSIS — Z853 Personal history of malignant neoplasm of breast: Secondary | ICD-10-CM

## 2023-06-03 DIAGNOSIS — Z880 Allergy status to penicillin: Secondary | ICD-10-CM

## 2023-06-03 DIAGNOSIS — I447 Left bundle-branch block, unspecified: Secondary | ICD-10-CM | POA: Diagnosis present

## 2023-06-03 DIAGNOSIS — N1832 Chronic kidney disease, stage 3b: Secondary | ICD-10-CM | POA: Diagnosis present

## 2023-06-03 LAB — URINALYSIS, ROUTINE W REFLEX MICROSCOPIC
Bilirubin Urine: NEGATIVE
Glucose, UA: 50 mg/dL — AB
Hgb urine dipstick: NEGATIVE
Ketones, ur: NEGATIVE mg/dL
Nitrite: NEGATIVE
Protein, ur: NEGATIVE mg/dL
Specific Gravity, Urine: 1.012 (ref 1.005–1.030)
pH: 5 (ref 5.0–8.0)

## 2023-06-03 LAB — OSMOLALITY: Osmolality: 311 mOsm/kg — ABNORMAL HIGH (ref 275–295)

## 2023-06-03 LAB — CBC WITH DIFFERENTIAL/PLATELET
Abs Immature Granulocytes: 0.02 10*3/uL (ref 0.00–0.07)
Basophils Absolute: 0 10*3/uL (ref 0.0–0.1)
Basophils Relative: 0 %
Eosinophils Absolute: 0.1 10*3/uL (ref 0.0–0.5)
Eosinophils Relative: 2 %
HCT: 35.7 % — ABNORMAL LOW (ref 36.0–46.0)
Hemoglobin: 11.3 g/dL — ABNORMAL LOW (ref 12.0–15.0)
Immature Granulocytes: 0 %
Lymphocytes Relative: 11 %
Lymphs Abs: 0.6 10*3/uL — ABNORMAL LOW (ref 0.7–4.0)
MCH: 30.1 pg (ref 26.0–34.0)
MCHC: 31.7 g/dL (ref 30.0–36.0)
MCV: 95.2 fL (ref 80.0–100.0)
Monocytes Absolute: 0.6 10*3/uL (ref 0.1–1.0)
Monocytes Relative: 11 %
Neutro Abs: 4.1 10*3/uL (ref 1.7–7.7)
Neutrophils Relative %: 76 %
Platelets: 112 10*3/uL — ABNORMAL LOW (ref 150–400)
RBC: 3.75 MIL/uL — ABNORMAL LOW (ref 3.87–5.11)
RDW: 13.8 % (ref 11.5–15.5)
WBC: 5.5 10*3/uL (ref 4.0–10.5)
nRBC: 0 % (ref 0.0–0.2)

## 2023-06-03 LAB — CBG MONITORING, ED
Glucose-Capillary: 233 mg/dL — ABNORMAL HIGH (ref 70–99)
Glucose-Capillary: 235 mg/dL — ABNORMAL HIGH (ref 70–99)
Glucose-Capillary: 223 mg/dL — ABNORMAL HIGH (ref 70–99)
Glucose-Capillary: 105 mg/dL — ABNORMAL HIGH (ref 70–99)

## 2023-06-03 LAB — I-STAT VENOUS BLOOD GAS, ED
Acid-base deficit: 3 mmol/L — ABNORMAL HIGH (ref 0.0–2.0)
Bicarbonate: 20.8 mmol/L (ref 20.0–28.0)
Calcium, Ion: 1 mmol/L — ABNORMAL LOW (ref 1.15–1.40)
HCT: 33 % — ABNORMAL LOW (ref 36.0–46.0)
Hemoglobin: 11.2 g/dL — ABNORMAL LOW (ref 12.0–15.0)
O2 Saturation: 94 %
Potassium: 4 mmol/L (ref 3.5–5.1)
Sodium: 137 mmol/L (ref 135–145)
TCO2: 22 mmol/L (ref 22–32)
pCO2, Ven: 31.3 mmHg — ABNORMAL LOW (ref 44–60)
pH, Ven: 7.431 — ABNORMAL HIGH (ref 7.25–7.43)
pO2, Ven: 68 mmHg — ABNORMAL HIGH (ref 32–45)

## 2023-06-03 LAB — VITAMIN B12: Vitamin B-12: 1212 pg/mL — ABNORMAL HIGH (ref 180–914)

## 2023-06-03 LAB — MAGNESIUM: Magnesium: 2.2 mg/dL (ref 1.7–2.4)

## 2023-06-03 LAB — ETHANOL: Alcohol, Ethyl (B): <10 mg/dL (ref <10)

## 2023-06-03 LAB — COMPREHENSIVE METABOLIC PANEL
ALT: 19 U/L (ref 0–44)
AST: 23 U/L (ref 15–41)
Albumin: 3.4 g/dL — ABNORMAL LOW (ref 3.5–5.0)
Alkaline Phosphatase: 84 U/L (ref 38–126)
Anion gap: 17 — ABNORMAL HIGH (ref 5–15)
BUN: 57 mg/dL — ABNORMAL HIGH (ref 8–23)
CO2: 20 mmol/L — ABNORMAL LOW (ref 22–32)
Calcium: 8.4 mg/dL — ABNORMAL LOW (ref 8.9–10.3)
Chloride: 99 mmol/L (ref 98–111)
Creatinine, Ser: 2.77 mg/dL — ABNORMAL HIGH (ref 0.44–1.00)
GFR, Estimated: 16 mL/min — ABNORMAL LOW (ref >60)
Glucose, Bld: 246 mg/dL — ABNORMAL HIGH (ref 70–99)
Potassium: 4.2 mmol/L (ref 3.5–5.1)
Sodium: 136 mmol/L (ref 135–145)
Total Bilirubin: 0.4 mg/dL (ref 0.3–1.2)
Total Protein: 6.5 g/dL (ref 6.5–8.1)

## 2023-06-03 LAB — PROTIME-INR
INR: 1.1 (ref 0.8–1.2)
Prothrombin Time: 14.6 seconds (ref 11.4–15.2)

## 2023-06-03 LAB — RAPID URINE DRUG SCREEN, HOSP PERFORMED
Amphetamines: NOT DETECTED
Barbiturates: NOT DETECTED
Benzodiazepines: NOT DETECTED
Cocaine: NOT DETECTED
Opiates: NOT DETECTED
Tetrahydrocannabinol: NOT DETECTED

## 2023-06-03 LAB — BETA-HYDROXYBUTYRIC ACID: Beta-Hydroxybutyric Acid: 0.09 mmol/L (ref 0.05–0.27)

## 2023-06-03 LAB — TSH: TSH: 0.595 u[IU]/mL (ref 0.350–4.500)

## 2023-06-03 LAB — GLUCOSE, CAPILLARY: Glucose-Capillary: 254 mg/dL — ABNORMAL HIGH (ref 70–99)

## 2023-06-03 MED ORDER — INSULIN ASPART 100 UNIT/ML IJ SOLN
8.0000 [IU] | Freq: Once | INTRAMUSCULAR | Status: AC
  Administered 2023-06-03: 8 [IU] via SUBCUTANEOUS

## 2023-06-03 MED ORDER — ACETAMINOPHEN 650 MG RE SUPP: 650.0000 mg | Freq: Four times a day (QID) | RECTAL | Status: DC | PRN

## 2023-06-03 MED ORDER — PREGABALIN 75 MG PO CAPS
75.0000 mg | ORAL_CAPSULE | Freq: Every day | ORAL | Status: DC
  Administered 2023-06-03 – 2023-06-04 (×2): 75 mg via ORAL
  Filled 2023-06-03 (×2): qty 1

## 2023-06-03 MED ORDER — INSULIN ASPART 100 UNIT/ML IJ SOLN
0.0000 [IU] | Freq: Three times a day (TID) | INTRAMUSCULAR | Status: DC
  Administered 2023-06-03: 5 [IU] via SUBCUTANEOUS
  Administered 2023-06-04: 3 [IU] via SUBCUTANEOUS
  Administered 2023-06-04 (×2): 2 [IU] via SUBCUTANEOUS
  Administered 2023-06-05: 5 [IU] via SUBCUTANEOUS
  Administered 2023-06-05: 2 [IU] via SUBCUTANEOUS

## 2023-06-03 MED ORDER — CARVEDILOL 6.25 MG PO TABS
6.2500 mg | ORAL_TABLET | Freq: Two times a day (BID) | ORAL | Status: DC
  Administered 2023-06-03 – 2023-06-05 (×5): 6.25 mg via ORAL
  Filled 2023-06-03 (×5): qty 1

## 2023-06-03 MED ORDER — SENNA 8.6 MG PO TABS
2.0000 | ORAL_TABLET | Freq: Every day | ORAL | Status: DC
  Administered 2023-06-04: 17.2 mg via ORAL
  Filled 2023-06-03 (×2): qty 2

## 2023-06-03 MED ORDER — ONDANSETRON HCL 4 MG/2ML IJ SOLN: 4.0000 mg | Freq: Four times a day (QID) | INTRAMUSCULAR | Status: DC | PRN

## 2023-06-03 MED ORDER — PREGABALIN 25 MG PO CAPS
50.0000 mg | ORAL_CAPSULE | Freq: Every morning | ORAL | Status: DC
  Administered 2023-06-03 – 2023-06-05 (×3): 50 mg via ORAL
  Filled 2023-06-03 (×3): qty 2

## 2023-06-03 MED ORDER — ACETAMINOPHEN 325 MG PO TABS
650.0000 mg | ORAL_TABLET | Freq: Four times a day (QID) | ORAL | Status: DC | PRN
  Administered 2023-06-03 (×2): 650 mg via ORAL
  Filled 2023-06-03 (×2): qty 2

## 2023-06-03 MED ORDER — OXYBUTYNIN CHLORIDE 5 MG PO TABS
5.0000 mg | ORAL_TABLET | Freq: Every day | ORAL | Status: DC
  Administered 2023-06-03 – 2023-06-05 (×3): 5 mg via ORAL
  Filled 2023-06-03 (×3): qty 1

## 2023-06-03 MED ORDER — ONDANSETRON HCL 4 MG PO TABS: 4.0000 mg | ORAL_TABLET | Freq: Four times a day (QID) | ORAL | Status: DC | PRN

## 2023-06-03 MED ORDER — AMLODIPINE BESYLATE 5 MG PO TABS
5.0000 mg | ORAL_TABLET | Freq: Every day | ORAL | Status: DC
  Administered 2023-06-03 – 2023-06-04 (×2): 5 mg via ORAL
  Filled 2023-06-03 (×2): qty 1

## 2023-06-03 MED ORDER — AMIODARONE HCL 200 MG PO TABS
200.0000 mg | ORAL_TABLET | Freq: Every day | ORAL | Status: DC
  Administered 2023-06-05: 200 mg via ORAL
  Filled 2023-06-03: qty 1

## 2023-06-03 MED ORDER — ESCITALOPRAM OXALATE 10 MG PO TABS
5.0000 mg | ORAL_TABLET | Freq: Every day | ORAL | Status: DC
  Administered 2023-06-03 – 2023-06-05 (×3): 5 mg via ORAL
  Filled 2023-06-03 (×3): qty 1

## 2023-06-03 MED ORDER — INSULIN GLARGINE-YFGN 100 UNIT/ML ~~LOC~~ SOLN
23.0000 [IU] | Freq: Every day | SUBCUTANEOUS | Status: DC
  Administered 2023-06-03: 23 [IU] via SUBCUTANEOUS
  Filled 2023-06-03 (×2): qty 0.23

## 2023-06-03 MED ORDER — POTASSIUM CHLORIDE CRYS ER 20 MEQ PO TBCR
20.0000 meq | EXTENDED_RELEASE_TABLET | Freq: Every day | ORAL | Status: DC
  Administered 2023-06-03 – 2023-06-05 (×3): 20 meq via ORAL
  Filled 2023-06-03 (×3): qty 1

## 2023-06-03 MED ORDER — SODIUM CHLORIDE 0.9 % IV BOLUS
1000.0000 mL | Freq: Once | INTRAVENOUS | Status: AC
  Administered 2023-06-03: 1000 mL via INTRAVENOUS

## 2023-06-03 MED ORDER — PANTOPRAZOLE SODIUM 40 MG PO TBEC
40.0000 mg | DELAYED_RELEASE_TABLET | Freq: Every morning | ORAL | Status: DC
  Administered 2023-06-03 – 2023-06-05 (×3): 40 mg via ORAL
  Filled 2023-06-03 (×3): qty 1

## 2023-06-03 MED ORDER — APIXABAN 2.5 MG PO TABS
2.5000 mg | ORAL_TABLET | Freq: Two times a day (BID) | ORAL | Status: DC
  Administered 2023-06-03 – 2023-06-05 (×5): 2.5 mg via ORAL
  Filled 2023-06-03 (×5): qty 1

## 2023-06-03 NOTE — ED Notes (Signed)
ED TO INPATIENT HANDOFF REPORT  ED Nurse Name and Phone #:  [phone]   S Name/Age/Gender [name] 89 y.o. female Room/Bed: 041C/041C  Code Status   Code Status: DNR  Home/SNF/Other Nursing Home Patient oriented to: self, place, time, and situation Is this baseline? Yes   Triage Complete: Triage complete  Chief Complaint Slurred speech [R47.81]  Triage Note PT BIB by [facility] from [facility] with a c/o of right sided eye twitching and slurred speech. This is not baseline, PT usually speaks fine. A and O x 4. CBG was 450 at 0930 was given 5 of Humalog, 10 of Humalog at 2235 and when EMS was called at 0000, CBG was 459. After 500l of fluid , it was 298. Speech has been off for x 2 days. Normally wheelchair bound.   Allergies Allergies  Allergen Reactions   Aspirin Itching   Penicillins Itching and Rash    DID THE REACTION INVOLVE: Swelling of the face/tongue/throat, SOB, or low BP? Yes Sudden or severe rash/hives, skin peeling, or the inside of the mouth or nose? No Did it require medical treatment? Yes When did it last happen? "More than 10 years ago" If all above answers are "NO", may proceed with cephalosporin use.  Tolerated full course of Unasyn in 2021.   Remeron [Mirtazapine] Other (See Comments)    Unknown reaction    Level of Care/Admitting Diagnosis ED Disposition     ED Disposition  Admit   Condition  --   Comment  Hospital Area: [facility] [[id number]]  Level of Care: Telemetry Medical [104]  May place patient in observation at [facility] or [facility] if equivalent level of care is available:: No  Covid Evaluation: Asymptomatic - no recent exposure (last 10 days) testing not required  Diagnosis: Slurred speech [phone]  Admitting Physician: Orland Mustard [[id number]]  Attending Physician: Orland Mustard [[id number]]          B Medical/Surgery History Past Medical History:  Diagnosis Date   Abnormal  echocardiogram    a. possible mass on echo 05/2019 on PPM, b. 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 Previously diastolic but patient AVS from outside hospital listed systolic CHF and cardiomyopathy, records pending.   CKD (chronic kidney disease), stage III (HCC)    Clotting disorder (HCC)    CVA (cerebral vascular accident) (HCC)    residual right sided weakness and mild dysphagia   Diabetes mellitus (HCC)    Dizziness and giddiness    DVT (deep venous thrombosis) (HCC)    a. on anticoagulation for this.   Essential hypertension    LBBB (left bundle branch block)    Mitral regurgitation    a. Mod MR by echo 2014.   Muscular deconditioning    Obesity    Pacemaker    Pericardial effusion    SVT (supraventricular tachycardia)    a. In 2013 she had an EPS with ablation for SVT which did not eliminate the SVT completely. She also had bradycardia which limited medication. She was placed on amiodarone by Dr. Ladona Ridgel.   Thrombocytopenia (HCC) 11/21/2011   Past Surgical History:  Procedure Laterality Date   BREAST BIOPSY Right  11/19/2018   positive   ESOPHAGOGASTRODUODENOSCOPY (EGD) WITH PROPOFOL N/A 02/10/2022   Procedure: ESOPHAGOGASTRODUODENOSCOPY (EGD) WITH PROPOFOL;  Surgeon: Jeani Hawking, MD;  Location[facility] ENDOSCOPY;  Service: Gastroenterology;  Laterality: N/A;   EYE SURGERY     SUPRAVENTRICULAR TACHYCARDIA ABLATION N/A 10/11/2012   Procedure: SUPRAVENTRICULAR TACHYCARDIA ABLATION;  Surgeon: Marinus Maw, MD;  Location: [facility] CATH LAB;  Service: Cardiovascular;  Laterality: N/A;     A IV Location/Drains/Wounds Patient Lines/Drains/Airways Status     Active Line/Drains/Airways     Name Placement date Placement time Site Days   Peripheral IV 06/03/23 20  G Left Forearm 06/03/23  0300  Forearm  less than 1   Pressure Injury 02/09/22 Coccyx Mid Stage 2 -  Partial thickness loss of dermis presenting as a shallow open injury with a red, pink wound bed without slough. Abrasion 02/09/22  2015  -- 479            Intake/Output Last 24 hours  Intake/Output Summary (Last 24 hours) at 06/03/2023 1147 Last data filed at 06/03/2023 1610 Gross per 24 hour  Intake 1000 ml  Output --  Net 1000 ml    Labs/Imaging Results for orders placed or performed during the hospital encounter of 06/03/23 (from the past 48 hour(s))  CBG monitoring, ED     Status: Abnormal   Collection Time: 06/03/23  2:01 AM  Result Value Ref Range   Glucose-Capillary 233 (H) 70 - 99 mg/dL    Comment: Glucose reference range applies only to samples taken after fasting for at least 8 hours.  CBG monitoring, ED     Status: Abnormal   Collection Time: 06/03/23  2:20 AM  Result Value Ref Range   Glucose-Capillary 235 (H) 70 - 99 mg/dL    Comment: Glucose reference range applies only to samples taken after fasting for at least 8 hours.  CBC with Differential (PNL)     Status: Abnormal   Collection Time: 06/03/23  3:12 AM  Result Value Ref Range   WBC 5.5 4.0 - 10.5 K/uL   RBC 3.75 (L) 3.87 - 5.11 MIL/uL   Hemoglobin 11.3 (L) 12.0 - 15.0 g/dL   HCT 96.0 (L) 45.4 - 09.8 %   MCV 95.2 80.0 - 100.0 fL   MCH 30.1 26.0 - 34.0 pg   MCHC 31.7 30.0 - 36.0 g/dL   RDW 11.9 14.7 - 82.9 %   Platelets 112 (L) 150 - 400 K/uL   nRBC 0.0 0.0 - 0.2 %   Neutrophils Relative % 76 %   Neutro Abs 4.1 1.7 - 7.7 K/uL   Lymphocytes Relative 11 %   Lymphs Abs 0.6 (L) 0.7 - 4.0 K/uL   Monocytes Relative 11 %   Monocytes Absolute 0.6 0.1 - 1.0 K/uL   Eosinophils Relative 2 %   Eosinophils Absolute 0.1 0.0 - 0.5 K/uL   Basophils Relative 0 %   Basophils Absolute 0.0 0.0 - 0.1 K/uL   Immature Granulocytes 0 %   Abs Immature Granulocytes 0.02 0.00 - 0.07 K/uL    Comment: Performed at [facility], [address] [place], [place] [address]  Beta-hydroxybutyric acid     Status: None   Collection Time: 06/03/23  3:12 AM  Result Value Ref Range   Beta-Hydroxybutyric Acid 0.09 0.05 - 0.27 mmol/L    Comment: Performed at [facility], [address] [place], [place] [address]  Osmolality     Status: Abnormal   Collection Time:  06/03/23  3:12 AM  Result Value Ref Range   Osmolality 311 (H) 275 - 295 mOsm/kg    Comment: Performed at [facility], [address], [place], [place] [address]  Ethanol     Status: None   Collection Time: 06/03/23  3:12 AM  Result Value Ref Range   Alcohol, Ethyl (B) <10 <10 mg/dL    Comment: (NOTE) Lowest detectable limit for serum alcohol is 10 mg/dL.  For medical purposes only. Performed at [facility], [address], [place], [place] [address]   Comprehensive metabolic panel     Status: Abnormal   Collection Time: 06/03/23  3:12 AM  Result Value Ref Range   Sodium 136 135 - 145 mmol/L   Potassium 4.2 3.5 - 5.1 mmol/L   Chloride 99 98 - 111 mmol/L   CO2 20 (L) 22 - 32 mmol/L   Glucose, Bld 246 (H) 70 - 99 mg/dL    Comment: Glucose reference range applies only to samples taken after fasting for at least 8 hours.   BUN 57 (H) 8 - 23 mg/dL   Creatinine, Ser 4.78 (H) 0.44 - 1.00 mg/dL   Calcium 8.4 (L) 8.9 - 10.3 mg/dL   Total Protein 6.5 6.5 - 8.1 g/dL   Albumin 3.4 (L) 3.5 - 5.0 g/dL   AST 23 15 - 41 U/L   ALT 19 0 - 44 U/L   Alkaline Phosphatase 84 38 - 126 U/L   Total Bilirubin 0.4 0.3 - 1.2 mg/dL   GFR, Estimated 16 (L) >60 mL/min    Comment: (NOTE) Calculated using the CKD-EPI Creatinine Equation (2021)    Anion gap 17 (H) 5 - 15    Comment: Performed at [facility], [address] [place], [place] [address]  Protime-INR     Status: None   Collection Time: 06/03/23  3:12 AM  Result Value Ref Range   Prothrombin Time 14.6 11.4 - 15.2 seconds   INR 1.1 0.8 - 1.2    Comment: (NOTE) INR goal varies  based on device and disease states. Performed at [facility], [address], [place], [place] [address]   I-Stat Venous Blood Gas, ED     Status: Abnormal   Collection Time: 06/03/23  3:21 AM  Result Value Ref Range   pH, Ven 7.431 (H) 7.25 - 7.43   pCO2, Ven 31.3 (L) 44 - 60 mmHg   pO2, Ven 68 (H) 32 - 45 mmHg   Bicarbonate 20.8 20.0 - 28.0 mmol/L   TCO2 22 22 - 32 mmol/L   O2 Saturation 94 %   Acid-base deficit 3.0 (H) 0.0 - 2.0 mmol/L   Sodium 137 135 - 145 mmol/L   Potassium 4.0 3.5 - 5.1 mmol/L   Calcium, Ion 1.00 (L) 1.15 - 1.40 mmol/L   HCT 33.0 (L) 36.0 - 46.0 %   Hemoglobin 11.2 (L) 12.0 - 15.0 g/dL   Sample type VENOUS   Urinalysis, Routine w reflex microscopic -Urine, Clean Catch     Status: Abnormal   Collection Time: 06/03/23  5:26 AM  Result Value Ref Range   Color, Urine YELLOW YELLOW   APPearance CLEAR CLEAR   Specific Gravity, Urine 1.012 1.005 - 1.030   pH 5.0 5.0 - 8.0   Glucose, UA 50 (A) NEGATIVE mg/dL   Hgb urine dipstick NEGATIVE NEGATIVE   Bilirubin Urine NEGATIVE NEGATIVE   Ketones, ur NEGATIVE NEGATIVE mg/dL   Protein, ur NEGATIVE NEGATIVE  mg/dL   Nitrite NEGATIVE NEGATIVE   Leukocytes,Ua TRACE (A) NEGATIVE   RBC / HPF 0-5 0 - 5 RBC/hpf   WBC, UA 0-5 0 - 5 WBC/hpf   Bacteria, UA RARE (A) NONE SEEN   Squamous Epithelial / HPF 6-10 0 - 5 /HPF   Mucus PRESENT    Hyaline Casts, UA PRESENT     Comment: Performed at [facility], [address] [place], [place] [address]  Rapid urine drug screen (hospital performed)     Status: None   Collection Time: 06/03/23  5:26 AM  Result Value Ref Range   Opiates NONE DETECTED NONE DETECTED   Cocaine NONE DETECTED NONE DETECTED   Benzodiazepines NONE DETECTED NONE DETECTED   Amphetamines NONE DETECTED NONE DETECTED   Tetrahydrocannabinol NONE DETECTED NONE DETECTED   Barbiturates NONE DETECTED NONE DETECTED    Comment: (NOTE) DRUG SCREEN FOR MEDICAL PURPOSES ONLY.  IF CONFIRMATION IS  NEEDED FOR ANY PURPOSE, NOTIFY LAB WITHIN 5 DAYS.  LOWEST DETECTABLE LIMITS FOR URINE DRUG SCREEN Drug Class                     Cutoff (ng/mL) Amphetamine and metabolites    1000 Barbiturate and metabolites    200 Benzodiazepine                 200 Opiates and metabolites        300 Cocaine and metabolites        300 THC                            50 Performed at [facility], [address] [place], [place] [address]   CBG monitoring, ED     Status: Abnormal   Collection Time: 06/03/23  6:55 AM  Result Value Ref Range   Glucose-Capillary 223 (H) 70 - 99 mg/dL    Comment: Glucose reference range applies only to samples taken after fasting for at least 8 hours.  Magnesium     Status: None   Collection Time: 06/03/23 10:54 AM  Result Value Ref Range   Magnesium 2.2 1.7 - 2.4 mg/dL    Comment: Performed at [facility], [address] [place], [place] [address]  POC CBG, ED     Status: Abnormal   Collection Time: 06/03/23 11:36 AM  Result Value Ref Range   Glucose-Capillary 105 (H) 70 - 99 mg/dL    Comment: Glucose reference range applies only to samples taken after fasting for at least 8 hours.   CT HEAD WO CONTRAST ( )  Result Date: 06/03/2023 CLINICAL DATA:  Neuro deficit, acute, stroke suspected. EXAM: CT HEAD WITHOUT CONTRAST TECHNIQUE: Contiguous axial images were obtained from the base of the skull through the vertex without intravenous contrast. RADIATION DOSE REDUCTION: This exam was performed according to the departmental dose-optimization program which includes automated exposure control, adjustment of the mA and/or kV according to patient size and/or use of iterative reconstruction technique. COMPARISON:  Head CT 09/06/2020 FINDINGS: Brain: Streak artifacts extend across most of the images, most likely due to patient positioning. There is mild cerebral atrophy, small-vessel disease, and atrophic ventriculomegaly. Cerebellum and brainstem are unremarkable.  No asymmetry worrisome for an acute infarct, hemorrhage or mass is seen through the artifacts. There is senescent mineralization in both basal ganglia. There is no midline shift. Basal cisterns are clear. Dural calcifications are scattered along the falx. Vascular:  There are patchy calcifications in the carotid siphons. There are no hyperdense central vessels. Skull: Negative for fractures or focal lesions. Sinuses/Orbits: No acute finding. Negative orbits apart from old right lens replacement. Clear sinuses and mastoids. Other: Midline nasal septum. IMPRESSION: Limited exam due to streak artifacts. No convincing acute intracranial CT abnormality. Chronic changes. Electronically Signed   By: Almira Bar M.D.   On: 06/03/2023 03:50   DG Chest Portable 1 View  Result Date: 06/03/2023 CLINICAL DATA:  Slurred speech EXAM: PORTABLE CHEST 1 VIEW COMPARISON:  05/27/2023 FINDINGS: Cardiac shadow is enlarged but stable. Pacing device is again seen. No sizable effusion is noted. Patchy atelectatic changes are noted in the left mid lung. No bony abnormality is seen. IMPRESSION: Patchy left basilar atelectasis Electronically Signed   By: Alcide Clever M.D.   On: 06/03/2023 03:16    Pending Labs Unresulted Labs (From admission, onward)     Start     Ordered   06/04/23 0500  Basic metabolic panel  Tomorrow morning,   R        06/03/23 1056   06/04/23 0500  CBC  Tomorrow morning,   R        06/03/23 1056   06/03/23 1007  TSH  Once,   URGENT        06/03/23 1006   06/03/23 1007  Vitamin B12  Once,   URGENT        06/03/23 1006            Vitals/Pain Today's Vitals   06/03/23 0800 06/03/23 0804 06/03/23 1000 06/03/23 1140  BP: (!) 155/81  134/67   Pulse: (!) 59  (!) 59   Resp: 16  (!) 23   Temp: 97.8 F (36.6 C)     TempSrc: Oral     SpO2: 100%  100%   Weight:      Height:      PainSc:  3   8     Isolation Precautions No active isolations  Medications Medications  insulin glargine-yfgn  (SEMGLEE) injection 23 Units (23 Units Subcutaneous Given 06/03/23 1134)  insulin aspart (novoLOG) injection 0-9 Units ( Subcutaneous Not Given 06/03/23 1139)  acetaminophen (TYLENOL) tablet 650 mg (has no administration in time range)    Or  acetaminophen (TYLENOL) suppository 650 mg (has no administration in time range)  ondansetron (ZOFRAN) tablet 4 mg (has no administration in time range)    Or  ondansetron (ZOFRAN) injection 4 mg (has no administration in time range)  sodium chloride 0.9 % bolus 1,000 mL (0 mLs Intravenous Stopped 06/03/23 0804)  insulin aspart (novoLOG) injection 8 Units (8 Units Subcutaneous Given 06/03/23 0715)    Mobility manual wheelchair     Focused Assessments Neuro Assessment Handoff:  Swallow screen pass? Yes  Cardiac Rhythm: Normal sinus rhythm       Neuro Assessment: Within Defined Limits Neuro Checks:      Has TPA been given? No If patient is a Neuro Trauma and patient is going to OR before floor call report to 4N Charge nurse: [phone] or [phone]   R Recommendations: See Admitting Provider Note  Report given to:   Additional Notes: pt has right sided facial twitching when she speaks, at rest she does not have any twitching. Pt getting EEG at this time. C.o right shoulder/chest pain at this time, will give tylenol for pain

## 2023-06-03 NOTE — H&P (Signed)
History and Physical    Patient: [name] UEA:[id number] DOB: 12/22/1932 DOA: 06/03/2023 DOS: the patient was seen and examined on 06/03/2023 PCP: Pcp, No  Patient coming from:  [facility]   She is WC dependent, but can transfer with assistance.    Chief Complaint: right sided facial twitching and slurred speech   HPI: [name] is a 89 y.o. female with medical history significant of systolic CHF, CKD stage III, hx of CVA, T2DM, hx of breast cancer, PAF on eliquis, CHB s/p PPM, who presented to ED with concerns of right sided facial twitching and slurred speech that started last night. Nursing has also not been administering her nightly dose of insulin for past few days since her return home from the hospital. Per family they didn't have orders for sugar checks or insulin. Family saw her last night and thought she was more drowsy and she had a twitch on the right side of her fast. She also thought she looked loopy and her speech was more slurred than her baseline. This was around 9:30pm at night. Her sugar was 589 and was given insulin which came down to 500 and EMS was called after midnight. Other concerns was some confusion and like her whole body would convulse like a hiccup. She also had some dizziness yesterday morning that she reported and was more drowsy yesterday throughout the day. No new weakness/droopy face. She has right sided weakness and aphasia from past CVA, but aphasia is worse per family.   Recently admitted at [facility] on 05/27/23-05/30/23 for systolic CHF exacerbation and HTN urgency. BP medication was adjusted. Started on 37.5mg  of cozaar and 6.25mg  of coreg BID. Continued home norvasc 5mg .     Denies any fever/chills, vision changes/headaches, chest pain or palpitations, shortness of breath or cough, abdominal pain, N/V/D, dysuria or leg swelling. Eating well.    She does not smoke or drink alcohol.   ER Course:  vitals: afebrile, bp: 141/74, HR: 59, RR:  17, oxygen: 100%RA Pertinent labs: glucose: 233, hgb: 11.3, BUN: 57, creatinine: 2.77,  CXR: patchy left basilar atelectasis Head CT: limited due to artifact. No convincing acute abnormality.  In ED neurology consulted. Given 1L IVF bolus and 8  units novolog.  Per neurology after improvement of hyperglycemia if patient continues to have right-sided facial twitching then need to do an EEG in the afternoon around 1 PM.    Review of Systems: As mentioned in the history of present illness. All other systems reviewed and are negative. Past Medical History:  Diagnosis Date   Abnormal echocardiogram    a. possible mass on echo 05/2019 on PPM, b. 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 Previously diastolic but patient AVS from outside hospital listed systolic CHF and cardiomyopathy, records pending.   CKD (chronic kidney disease), stage III (HCC)    Clotting disorder (HCC)    CVA (cerebral vascular accident) (HCC)    residual right sided weakness and mild dysphagia   Diabetes mellitus (HCC)    Dizziness and giddiness    DVT (deep venous thrombosis) (HCC)    a. on anticoagulation for this.   Essential hypertension    LBBB (left bundle  branch block)    Mitral regurgitation    a. Mod MR by echo 2014.   Muscular deconditioning    Obesity    Pacemaker    Pericardial effusion    SVT (supraventricular tachycardia)    a. In 2013 she had an EPS with ablation for SVT which did not eliminate the SVT completely. She also had bradycardia which limited medication. She was placed on amiodarone by Dr. Ladona Ridgel.   Thrombocytopenia (HCC) 11/21/2011   Past Surgical History:  Procedure Laterality Date   BREAST BIOPSY Right 11/19/2018   positive   ESOPHAGOGASTRODUODENOSCOPY (EGD) WITH  PROPOFOL N/A 02/10/2022   Procedure: ESOPHAGOGASTRODUODENOSCOPY (EGD) WITH PROPOFOL;  Surgeon: Jeani Hawking, MD;  Location: [facility] ENDOSCOPY;  Service: Gastroenterology;  Laterality: N/A;   EYE SURGERY     SUPRAVENTRICULAR TACHYCARDIA ABLATION N/A 10/11/2012   Procedure: SUPRAVENTRICULAR TACHYCARDIA ABLATION;  Surgeon: Marinus Maw, MD;  Location: [facility] CATH LAB;  Service: Cardiovascular;  Laterality: N/A;   Social History:  reports that she has never smoked. She has never used smokeless tobacco. She reports that she does not drink alcohol and does not use drugs.  Allergies  Allergen Reactions   Aspirin Itching   Penicillins Itching and Rash    DID THE REACTION INVOLVE: Swelling of the face/tongue/throat, SOB, or low BP? Yes Sudden or severe rash/hives, skin peeling, or the inside of the mouth or nose? No Did it require medical treatment? Yes When did it last happen? "More than 10 years ago" If all above answers are "NO", may proceed with cephalosporin use.  Tolerated full course of Unasyn in 2021.   Remeron [Mirtazapine] Other (See Comments)    Unknown reaction    Family History  Problem Relation Age of Onset   Stroke Mother    Cancer Father        prostate   Hypertension Daughter    Heart attack Brother        x2   Hypertension Brother    Stroke Sister    Hypertension Sister    Breast cancer Maternal Aunt     Prior to Admission medications   Medication Sig Start Date End Date Taking? Authorizing Provider  acetaminophen (TYLENOL) 650 MG CR tablet Take 650 mg by mouth every 8 (eight) hours.    [provider]  amiodarone (PACERONE) 200 MG tablet Take one tablet by mouth daily Monday-Friday. Do NOT take Saturday and Sunday 06/21/22   Graciella Freer, PA-C  amLODipine (NORVASC) 5 MG tablet Take 5 mg by mouth daily. 01/26/23   [provider]  atorvastatin (LIPITOR) 10 MG tablet Take 10 mg by mouth at bedtime. Patient not taking: Reported on 04/10/2023     [provider]  benzonatate (TESSALON) 100 MG capsule Take 1 capsule (100 mg total) by mouth 3 (three) times daily as needed for cough. Patient not taking: Reported on 04/10/2023 12/16/22   Mesner, Barbara Cower, MD  cetirizine (ZYRTEC) 10 MG tablet Take 10 mg by mouth every morning. Patient not taking: Reported on 04/10/2023    [provider]  Docusate Sodium 100 MG capsule Take 100 mg by mouth every morning.    [provider]  Dulaglutide (TRULICITY) 1.5 MG/0.5ML SOPN Inject 1.5 mg into the skin every Thursday. Patient not taking: Reported on 04/10/2023    [provider]  ELIQUIS 2.5 MG TABS tablet TAKE 1 TABLET(2.5 MG) BY MOUTH TWICE DAILY Patient taking differently: Take 2.5 mg by mouth 2 (two) times daily. 07/13/20   Dunn,  Dayna N, PA-C  escitalopram (LEXAPRO) 5 MG tablet Take 5 mg by mouth daily. 03/25/23   [provider]  ferrous sulfate 325 (65 FE) MG tablet Take 325 mg by mouth 3 (three) times daily with meals. Patient not taking: Reported on 04/10/2023    [provider]  folic acid (FOLVITE) 400 MCG tablet Take 400 mcg by mouth every morning. Patient not taking: Reported on 04/10/2023    [provider]  guaifenesin (HUMIBID E) 400 MG TABS tablet Take 400 mg by mouth. Take 1 1/2 tablets by mouth at bedtime    [provider]  insulin glargine (LANTUS) 100 unit/mL SOPN Inject 16 Units into the skin at bedtime. Patient taking differently: Inject 23 Units into the skin at bedtime. 02/11/22   Elgergawy, Leana Roe, MD  lisinopril (ZESTRIL) 5 MG tablet Take 1 tablet (5 mg total) by mouth daily. Patient not taking: Reported on 04/10/2023 10/13/20   Marinda Elk, MD  loratadine (CLARITIN) 10 MG tablet Take 10 mg by mouth daily.    [provider]  Menthol, Topical Analgesic, (BIOFREEZE) 4 % GEL Apply 1 application  topically 3 (three) times daily. For shoulder pain Patient not taking: Reported on 04/10/2023    [provider]  metoprolol succinate (TOPROL-XL) 25 MG 24 hr tablet Take 1 tablet (25 mg total) by mouth daily. Patient taking differently: Take 25 mg by mouth every morning. 07/21/20   Glade Lloyd, MD  omeprazole (PRILOSEC OTC) 20 MG tablet Take 20 mg by mouth every morning.    [provider]  oxybutynin (DITROPAN) 5 MG tablet TAKE 1 TABLET(5 MG) BY MOUTH TWICE DAILY Patient taking differently: Take 5 mg by mouth daily. 12/23/19   Dorothyann Peng, MD  polyethylene glycol (MIRALAX / GLYCOLAX) 17 g packet Take 17 g by mouth every morning. Patient not taking: Reported on 04/10/2023    [provider]  potassium chloride (KLOR-CON) 10 MEQ tablet Take 2 tablets (20 mEq total) by mouth daily. 02/11/22   Elgergawy, Leana Roe, MD  pregabalin (LYRICA) 50 MG capsule Take 50 mg by mouth every morning. 06/10/22   [provider]  pregabalin (LYRICA) 75 MG capsule Take 1 capsule (75 mg total) by mouth at bedtime. 06/30/20   Arnette Felts, FNP  torsemide (DEMADEX) 20 MG tablet Take 40 mg every morning.  Take 20-40 mg daily every afternoon as needed for swelling or shortness of breath 04/10/23   Corky Crafts, MD  TRULICITY 3 MG/0.5ML SOPN Inject into the skin. 04/04/23   [provider]  vitamin B-12 (CYANOCOBALAMIN) 500 MCG tablet Take 500 mcg by mouth every morning. Patient not taking: Reported on 04/10/2023    [provider]  vitamin C (ASCORBIC ACID) 500 MG tablet Take 500 mg by mouth 3 (three) times daily. Patient not taking: Reported on 04/10/2023    [provider]    Physical Exam: Vitals:   06/03/23 1000 06/03/23 1200 06/03/23 1245 06/03/23 1322  BP: 134/67 (!) 156/77  131/78  Pulse: (!) 59 60  60  Resp: (!) 23 19  15   Temp:   98.1 F (36.7 C)   TempSrc:   Oral   SpO2: 100% 100%  98%  Weight:      Height:       General:  Appears calm and comfortable and is in NAD Eyes:  PERRL, EOMI, normal lids, Cristalle ENT:  grossly normal hearing, lips  & tongue, mmm; appropriate dentition Neck:  no LAD,  masses or thyromegaly; no carotid bruits Cardiovascular:  RRR, no m/r/g. 1+ BLE edema  Respiratory:   CTA bilaterally with no wheezes/rales/rhonchi.  Normal respiratory effort. Abdomen:  soft, NT, ND, NABS Back:   normal alignment, no CVAT Skin:  no rash or induration seen on limited exam Musculoskeletal:  grossly normal tone BUE/BLE, mildly weaker on right side, good ROM, no bony abnormality Lower extremity:   Limited foot exam with no ulcerations.  2+ distal pulses. Psychiatric:  grossly normal mood and affect, speech mildly aphasic/dysarthric, AOx3 Neurologic:  CN 2-12 grossly intact, moves all extremities in coordinated fashion, sensation intact   Radiological Exams on Admission: Independently reviewed - see discussion in A/P where applicable  EEG adult  Result Date: 06/03/2023 Charlsie Quest, MD     06/03/2023 12:43 PM Patient Name: [name] MRN: [id number] Epilepsy Attending: Charlsie Quest Referring Physician/Provider: Orland Mustard, MD Date: 06/03/2023 Duration: 22.38 mins Patient history: 89yo female with right sided facial twitching and slurred speech. EEG to evaluate for seizure. Level of alertness: Awake AEDs during EEG study: None Technical aspects: This EEG study was done with scalp electrodes positioned according to the 10-20 International system of electrode placement. Electrical activity was reviewed with band pass filter of 1-70Hz , sensitivity of 7 uV/mm, display speed of 40mm/sec with a 60Hz  notched filter applied as appropriate. EEG data were recorded continuously and digitally stored.  Video monitoring was available and reviewed as appropriate. Description: The posterior dominant rhythm consists of 9-10 Hz activity of moderate voltage (25-35 uV) seen predominantly in posterior head regions, symmetric and reactive to eye opening and eye closing. Generalized sharp transients were noted. Physiologic photic driving was not  seen during photic stimulation.  Hyperventilation was not performed.   Of note, study was technically difficult due to significant myogenic artifact. IMPRESSION: This technically difficult study is within normal limits. No seizures or epileptiform discharges were seen throughout the recording. A normal interictal EEG does not exclude the diagnosis of epilepsy. Priyanka Annabelle Harman   CT HEAD WO CONTRAST ( )  Result Date: 06/03/2023 CLINICAL DATA:  Neuro deficit, acute, stroke suspected. EXAM: CT HEAD WITHOUT CONTRAST TECHNIQUE: Contiguous axial images were obtained from the base of the skull through the vertex without intravenous contrast. RADIATION DOSE REDUCTION: This exam was performed according to the departmental dose-optimization program which includes automated exposure control, adjustment of the mA and/or kV according to patient size and/or use of iterative reconstruction technique. COMPARISON:  Head CT 09/06/2020 FINDINGS: Brain: Streak artifacts extend across most of the images, most likely due to patient positioning. There is mild cerebral atrophy, small-vessel disease, and atrophic ventriculomegaly. Cerebellum and brainstem are unremarkable. No asymmetry worrisome for an acute infarct, hemorrhage or mass is seen through the artifacts. There is senescent mineralization in both basal ganglia. There is no midline shift. Basal cisterns are clear. Dural calcifications are scattered along the falx. Vascular: There are patchy calcifications in the carotid siphons. There are no hyperdense central vessels. Skull: Negative for fractures or focal lesions. Sinuses/Orbits: No acute finding. Negative orbits apart from old right lens replacement. Clear sinuses and mastoids. Other: Midline nasal septum. IMPRESSION: Limited exam due to streak artifacts. No convincing acute intracranial CT abnormality. Chronic changes. Electronically Signed   By: Almira Bar M.D.   On: 06/03/2023 03:50   DG Chest Portable 1  View  Result Date: 06/03/2023 CLINICAL DATA:  Slurred speech EXAM: PORTABLE CHEST 1 VIEW COMPARISON:  05/27/2023 FINDINGS: Cardiac shadow is enlarged but stable. Pacing  device is again seen. No sizable effusion is noted. Patchy atelectatic changes are noted in the left mid lung. No bony abnormality is seen. IMPRESSION: Patchy left basilar atelectasis Electronically Signed   By: Alcide Clever M.D.   On: 06/03/2023 03:16    EKG: Independently reviewed.  NSR with rate 60; nonspecific ST changes with no evidence of acute ischemia. LBBB (similar to previous)    Labs on Admission: I have personally reviewed the available labs and imaging studies at the time of the admission.  Pertinent labs:   : glucose: 233,  hgb: 11.3,  BUN: 57,  creatinine: 2.77,   Assessment and Plan: Principal Problem:   Slurred speech and right facial twitching Active Problems:   Acute renal failure superimposed on stage 3b chronic kidney disease (HCC)   DM (diabetes mellitus) (HCC)   HTN (hypertension)   Paroxysmal atrial fibrillation (HCC)   Combined congestive systolic and diastolic heart failure (HCC)   hx of DVT   GERD (gastroesophageal reflux disease)    Assessment and Plan: * Slurred speech and right facial twitching 89 year old presenting with right sided facial spasms and worsening aphasia, drowsiness and confusion from baseline that started yesterday evening -obs to tele  -CT head with no acute findings. Neurology called in ED and didn't think she was stroke. Advised to correct blood sugar and if not improved to let them know. Sugar corrected and patient still with hemifacial spasms. Discussed with neurology on call and after chart review she has had this with worsening aphasia since 10/2021 where she saw neurology outpatient. Will get EEG and if abnormal will formally consult neurology  -AKI on CKD could also be contributing with increased uremia, although this is mild  -swallow screen  -seizure  precautions   Acute renal failure superimposed on stage 3b chronic kidney disease (HCC) Baseline creatinine of 1.6-1.7 and 1.4 on hospital d/c on 8/6 Presenting today with creatinine of 2.77 in setting of hospitalization a few days ago for acute combined CHF exacerbation Possibly due to diuresis  UA obtained Given 1L IVF Hold nephrotoxic drugs Strict I/O  Watch MAP and bp with blood pressure medication change as well  Trend    DM (diabetes mellitus) (HCC) A1C this month of 8.3 Missed her regular long acting insulin last few days at her SNF and blood sugar reported in 500 Given 1L IVF here no DKA and 8 units novolog. Sugar now in 200 Start back her home long acting insulin at 23 units and will give now SSI and accuchecks QAC/HS   HTN (hypertension) Had HTN urgency last week at [facility]  Blood pressure medication change and she is now on the following -continued norvasc 5mg  daily  -metoprolol changed to coreg 6.25mg  BID -cozaar 25mg  added BP has been stable, continue for now. Do not think these drug changes contributing to presenting symptoms  Monitor to make sure MAP adequate for renal perfusion and not contributing to AKI   Paroxysmal atrial fibrillation (HCC) Continue eliquis  Metoprolol changed to coreg 6.25mg  BID  Continue amiodorone   Combined congestive systolic and diastolic heart failure (HCC) Just hospitalized for CHF exacerbation and diuresed with medication change Echo in 12/2022 showed improved EF from 30% to 45-50%. Diastolic indeterminate.  Appears euvolemic Continue medical management with coreg. Holding torsemide with acute kidney injury as well as ARB  Daily weights   hx of DVT Remote hx treated with coumadin in the past Continue eliquis BID   GERD (gastroesophageal reflux disease) Continue  omeprazole, recently increased to BID a few days ago at hospital D/C     Advance Care Planning:   Code Status: DNR   Consults: neurology   DVT Prophylaxis:  eliquis  Family Communication: granddaughters at bedside   Severity of Illness: The appropriate patient status for this patient is OBSERVATION. Observation status is judged to be reasonable and necessary in order to provide the required intensity of service to ensure the patient's safety. The patient's presenting symptoms, physical exam findings, and initial radiographic and laboratory data in the context of their medical condition is felt to place them at decreased risk for further clinical deterioration. Furthermore, it is anticipated that the patient will be medically stable for discharge from the hospital within 2 midnights of admission.   Author: Orland Mustard, MD 06/03/2023 2:19 PM  For on call review [email or web address].

## 2023-06-03 NOTE — Assessment & Plan Note (Signed)
Continue omeprazole, recently increased to BID a few days ago at hospital D/C

## 2023-06-03 NOTE — Procedures (Signed)
Patient Name: [name]  MRN: [id number]  Epilepsy Attending: Charlsie Quest  Referring Physician/Provider: Orland Mustard, MD  Date: 06/03/2023 Duration: 22.38 mins  Patient history: 89yo female with right sided facial twitching and slurred speech. EEG to evaluate for seizure.   Level of alertness: Awake  AEDs during EEG study: None  Technical aspects: This EEG study was done with scalp electrodes positioned according to the 10-20 International system of electrode placement. Electrical activity was reviewed with band pass filter of 1-70Hz , sensitivity of 7 uV/mm, display speed of 17mm/sec with a 60Hz  notched filter applied as appropriate. EEG data were recorded continuously and digitally stored.  Video monitoring was available and reviewed as appropriate.  Description: The posterior dominant rhythm consists of 9-10 Hz activity of moderate voltage (25-35 uV) seen predominantly in posterior head regions, symmetric and reactive to eye opening and eye closing. Generalized sharp transients were noted. Physiologic photic driving was not seen during photic stimulation.  Hyperventilation was not performed.     Of note, study was technically difficult due to significant myogenic artifact.  IMPRESSION: This technically difficult study is within normal limits. No seizures or epileptiform discharges were seen throughout the recording.  A normal interictal EEG does not exclude the diagnosis of epilepsy.   Annabelle Harman

## 2023-06-03 NOTE — Assessment & Plan Note (Addendum)
Baseline creatinine of 1.6-1.7 and 1.4 on hospital d/c on 8/6 Presenting today with creatinine of 2.77 in setting of hospitalization a few days ago for acute combined CHF exacerbation Possibly due to diuresis  UA obtained Given 1L IVF Hold nephrotoxic drugs Strict I/O  Watch MAP and bp with blood pressure medication change as well  Trend

## 2023-06-03 NOTE — Plan of Care (Signed)

## 2023-06-03 NOTE — Assessment & Plan Note (Addendum)
Had HTN urgency last week at [facility]  Blood pressure medication change and she is now on the following -continued norvasc 5mg  daily  -metoprolol changed to coreg 6.25mg  BID -cozaar 25mg  added BP has been stable, continue for now. Do not think these drug changes contributing to presenting symptoms  Monitor to make sure MAP adequate for renal perfusion and not contributing to AKI

## 2023-06-03 NOTE — Assessment & Plan Note (Addendum)
Continue eliquis  Metoprolol changed to coreg 6.25mg  BID  Continue amiodorone

## 2023-06-03 NOTE — Assessment & Plan Note (Addendum)
Just hospitalized for CHF exacerbation and diuresed with medication change Echo in 12/2022 showed improved EF from 30% to 45-50%. Diastolic indeterminate.  Appears euvolemic Continue medical management with coreg. Holding torsemide with acute kidney injury as well as ARB  Daily weights

## 2023-06-03 NOTE — Assessment & Plan Note (Addendum)
89 year old presenting with right sided facial spasms and worsening aphasia, drowsiness and confusion from baseline that started yesterday evening -obs to tele  -CT head with no acute findings. Neurology called in ED and didn't think she was stroke. Advised to correct blood sugar and if not improved to let them know. Sugar corrected and patient still with hemifacial spasms. Discussed with neurology on call and after chart review she has had this with worsening aphasia since 10/2021 where she saw neurology outpatient. Will get EEG and if abnormal will formally consult neurology  -AKI on CKD could also be contributing with increased uremia, although this is mild  -swallow screen  -seizure precautions

## 2023-06-03 NOTE — Assessment & Plan Note (Signed)
A1C this month of 8.3 Missed her regular long acting insulin last few days at her SNF and blood sugar reported in 500 Given 1L IVF here no DKA and 8 units novolog. Sugar now in 200 Start back her home long acting insulin at 23 units and will give now SSI and accuchecks QAC/HS

## 2023-06-03 NOTE — ED Notes (Signed)
EEG at bedside.

## 2023-06-03 NOTE — ED Provider Notes (Cosign Needed Addendum)
[place] EMERGENCY DEPARTMENT AT [facility] Provider Note   CSN: [id number] Arrival date & time: 06/03/23  0157     History  Chief Complaint  Patient presents with   Hyperglycemia    [name] is a 89 y.o. female who presents from rehab facility with report of right-sided facial twitching and slurred speech that started this evening.  At baseline patient speaks normally and is ANO x 4.  Patient is ANO x 4 at this time.  Wheelchair-bound secondary to neuropathy from diabetes.  Per physician at facility, night nurse has not been administering the patient her night dose of insulin for the last few days resulting in hyperglycemia up to 559.  Patient administered 500 cc of fluid and route as well as 2 dose of insulin prior to her transport to the hospital.  At time my evaluation patient still concerned about difficulty with speaking and right-sided facial twitch.  Denies pain.  I have reviewed her medical records.  History of hypertension, diabetes, CKD stage III CHF, history of DVT anticoagulated on Eliquis. CHF with EF of 30-40%.   HPI     Home Medications Prior to Admission medications   Medication Sig Start Date End Date Taking? Authorizing Provider  acetaminophen (TYLENOL) 650 MG CR tablet Take 650 mg by mouth every 8 (eight) hours.    [provider]  amiodarone (PACERONE) 200 MG tablet Take one tablet by mouth daily Monday-Friday. Do NOT take Saturday and Sunday 06/21/22   Graciella Freer, PA-C  amLODipine (NORVASC) 5 MG tablet Take 5 mg by mouth daily. 01/26/23   [provider]  atorvastatin (LIPITOR) 10 MG tablet Take 10 mg by mouth at bedtime. Patient not taking: Reported on 04/10/2023    [provider]  benzonatate (TESSALON) 100 MG capsule Take 1 capsule (100 mg total) by mouth 3 (three) times daily as needed for cough. Patient not taking: Reported on 04/10/2023 12/16/22   Mesner, Barbara Cower, MD  cetirizine (ZYRTEC) 10 MG tablet Take  10 mg by mouth every morning. Patient not taking: Reported on 04/10/2023    [provider]  Docusate Sodium 100 MG capsule Take 100 mg by mouth every morning.    [provider]  Dulaglutide (TRULICITY) 1.5 MG/0.5ML SOPN Inject 1.5 mg into the skin every Thursday. Patient not taking: Reported on 04/10/2023    [provider]  ELIQUIS 2.5 MG TABS tablet TAKE 1 TABLET(2.5 MG) BY MOUTH TWICE DAILY Patient taking differently: Take 2.5 mg by mouth 2 (two) times daily. 07/13/20   Dunn, Kriste Basque N, PA-C  escitalopram (LEXAPRO) 5 MG tablet Take 5 mg by mouth daily. 03/25/23   [provider]  ferrous sulfate 325 (65 FE) MG tablet Take 325 mg by mouth 3 (three) times daily with meals. Patient not taking: Reported on 04/10/2023    [provider]  folic acid (FOLVITE) 400 MCG tablet Take 400 mcg by mouth every morning. Patient not taking: Reported on 04/10/2023    [provider]  guaifenesin (HUMIBID E) 400 MG TABS tablet Take 400 mg by mouth. Take 1 1/2 tablets by mouth at bedtime    [provider]  insulin glargine (LANTUS) 100 unit/mL SOPN Inject 16 Units into the skin at bedtime. Patient taking differently: Inject 23 Units into the skin at bedtime. 02/11/22   Elgergawy, Leana Roe, MD  lisinopril (ZESTRIL) 5 MG tablet Take 1 tablet (5 mg total) by mouth daily. Patient not taking: Reported on 04/10/2023 10/13/20  Marinda Elk, MD  loratadine (CLARITIN) 10 MG tablet Take 10 mg by mouth daily.    [provider]  Menthol, Topical Analgesic, (BIOFREEZE) 4 % GEL Apply 1 application  topically 3 (three) times daily. For shoulder pain Patient not taking: Reported on 04/10/2023    [provider]  metoprolol succinate (TOPROL-XL) 25 MG 24 hr tablet Take 1 tablet (25 mg total) by mouth daily. Patient taking differently: Take 25 mg by mouth every morning. 07/21/20   Glade Lloyd, MD  omeprazole (PRILOSEC OTC) 20 MG tablet Take 20 mg  by mouth every morning.    [provider]  oxybutynin (DITROPAN) 5 MG tablet TAKE 1 TABLET(5 MG) BY MOUTH TWICE DAILY Patient taking differently: Take 5 mg by mouth daily. 12/23/19   Dorothyann Peng, MD  polyethylene glycol (MIRALAX / GLYCOLAX) 17 g packet Take 17 g by mouth every morning. Patient not taking: Reported on 04/10/2023    [provider]  potassium chloride (KLOR-CON) 10 MEQ tablet Take 2 tablets (20 mEq total) by mouth daily. 02/11/22   Elgergawy, Leana Roe, MD  pregabalin (LYRICA) 50 MG capsule Take 50 mg by mouth every morning. 06/10/22   [provider]  pregabalin (LYRICA) 75 MG capsule Take 1 capsule (75 mg total) by mouth at bedtime. 06/30/20   Arnette Felts, FNP  torsemide (DEMADEX) 20 MG tablet Take 40 mg every morning.  Take 20-40 mg daily every afternoon as needed for swelling or shortness of breath 04/10/23   Corky Crafts, MD  TRULICITY 3 MG/0.5ML SOPN Inject into the skin. 04/04/23   [provider]  vitamin B-12 (CYANOCOBALAMIN) 500 MCG tablet Take 500 mcg by mouth every morning. Patient not taking: Reported on 04/10/2023    [provider]  vitamin C (ASCORBIC ACID) 500 MG tablet Take 500 mg by mouth 3 (three) times daily. Patient not taking: Reported on 04/10/2023    [provider]      Allergies    Aspirin, Penicillins, and Remeron [mirtazapine]    Review of Systems   Review of Systems  Neurological:  Positive for facial asymmetry and speech difficulty.  Psychiatric/Behavioral:  Positive for confusion.     Physical Exam Updated Vital Signs BP (!) 154/71 (BP Location: Right Arm)   Pulse 60   Temp 98.6 F (37 C) (Oral)   Resp 19   Ht 5\' 5"  (1.651 m)   Wt 90.3 kg   SpO2 100%   BMI 33.13 kg/m  Physical Exam Vitals and nursing note reviewed.  Constitutional:      Appearance: She is obese. She is not ill-appearing or toxic-appearing.  HENT:     Head: Normocephalic and atraumatic.     Mouth/Throat:      Mouth: Mucous membranes are moist.     Pharynx: No oropharyngeal exudate or posterior oropharyngeal erythema.  Eyes:     General:        Right eye: No discharge.        Left eye: No discharge.     Conjunctiva/sclera: Conjunctivae normal.  Cardiovascular:     Rate and Rhythm: Normal rate and regular rhythm.     Pulses: Normal pulses.     Heart sounds: Normal heart sounds.  Pulmonary:     Effort: Pulmonary effort is normal. No respiratory distress.     Breath sounds: Normal breath sounds. No wheezing or rales.  Abdominal:     General: Bowel sounds are normal. There is no distension.  Palpations: Abdomen is soft.     Tenderness: There is no abdominal tenderness. There is no guarding or rebound.  Musculoskeletal:        General: No deformity.     Cervical back: Neck supple.  Skin:    General: Skin is warm and dry.     Capillary Refill: Capillary refill takes less than 2 seconds.  Neurological:     Mental Status: She is alert and oriented to person, place, and time.     GCS: GCS eye subscore is 3. GCS verbal subscore is 4. GCS motor subscore is 6.     Cranial Nerves: Dysarthria present.     Sensory: Sensation is intact.     Motor: No tremor or pronator drift.     Comments: Right-sided facial twitching throughout exam.  Right-sided upper and lower extremity weakness as residual deficits from prior CVA.  Impaired finger-to-nose testing on the left side of the movement is normal on the left.  Impaired rapid alternating movement on the right as well, per patient at baseline.  Gait deferred for patient safety.  Stuttering speech  Psychiatric:        Mood and Affect: Mood normal.     ED Results / Procedures / Treatments   Labs (all labs ordered are listed, but only abnormal results are displayed) Labs Reviewed  CBC WITH DIFFERENTIAL/PLATELET - Abnormal; Notable for the following components:      Result Value   RBC 3.75 (*)    Hemoglobin 11.3 (*)    HCT 35.7 (*)    Platelets  112 (*)    Lymphs Abs 0.6 (*)    All other components within normal limits  URINALYSIS, ROUTINE W REFLEX MICROSCOPIC - Abnormal; Notable for the following components:   Glucose, UA 50 (*)    Leukocytes,Ua TRACE (*)    Bacteria, UA RARE (*)    All other components within normal limits  OSMOLALITY - Abnormal; Notable for the following components:   Osmolality 311 (*)    All other components within normal limits  COMPREHENSIVE METABOLIC PANEL - Abnormal; Notable for the following components:   CO2 20 (*)    Glucose, Bld 246 (*)    BUN 57 (*)    Creatinine, Ser 2.77 (*)    Calcium 8.4 (*)    Albumin 3.4 (*)    GFR, Estimated 16 (*)    Anion gap 17 (*)    All other components within normal limits  CBG MONITORING, ED - Abnormal; Notable for the following components:   Glucose-Capillary 233 (*)    All other components within normal limits  CBG MONITORING, ED - Abnormal; Notable for the following components:   Glucose-Capillary 235 (*)    All other components within normal limits  I-STAT VENOUS BLOOD GAS, ED - Abnormal; Notable for the following components:   pH, Ven 7.431 (*)    pCO2, Ven 31.3 (*)    pO2, Ven 68 (*)    Acid-base deficit 3.0 (*)    Calcium, Ion 1.00 (*)    HCT 33.0 (*)    Hemoglobin 11.2 (*)    All other components within normal limits  BETA-HYDROXYBUTYRIC ACID  RAPID URINE DRUG SCREEN, HOSP PERFORMED  ETHANOL  PROTIME-INR  CBG MONITORING, ED  CBG MONITORING, ED    EKG EKG Interpretation Date/Time:  Saturday June 03 2023 01:59:55 EDT Ventricular Rate:  60 PR Interval:  59 QRS Duration:  170 QT Interval:  510 QTC Calculation: 510 R Axis:   -  77  Text Interpretation: Sinus rhythm Short PR interval Left bundle branch block No significant change since last tracing Confirmed by Zadie Rhine ([id number]) on 06/03/2023 3:56:53 AM  Radiology CT HEAD WO CONTRAST ( )  Result Date: 06/03/2023 CLINICAL DATA:  Neuro deficit, acute, stroke suspected. EXAM: CT  HEAD WITHOUT CONTRAST TECHNIQUE: Contiguous axial images were obtained from the base of the skull through the vertex without intravenous contrast. RADIATION DOSE REDUCTION: This exam was performed according to the departmental dose-optimization program which includes automated exposure control, adjustment of the mA and/or kV according to patient size and/or use of iterative reconstruction technique. COMPARISON:  Head CT 09/06/2020 FINDINGS: Brain: Streak artifacts extend across most of the images, most likely due to patient positioning. There is mild cerebral atrophy, small-vessel disease, and atrophic ventriculomegaly. Cerebellum and brainstem are unremarkable. No asymmetry worrisome for an acute infarct, hemorrhage or mass is seen through the artifacts. There is senescent mineralization in both basal ganglia. There is no midline shift. Basal cisterns are clear. Dural calcifications are scattered along the falx. Vascular: There are patchy calcifications in the carotid siphons. There are no hyperdense central vessels. Skull: Negative for fractures or focal lesions. Sinuses/Orbits: No acute finding. Negative orbits apart from old right lens replacement. Clear sinuses and mastoids. Other: Midline nasal septum. IMPRESSION: Limited exam due to streak artifacts. No convincing acute intracranial CT abnormality. Chronic changes. Electronically Signed   By: Almira Bar M.D.   On: 06/03/2023 03:50   DG Chest Portable 1 View  Result Date: 06/03/2023 CLINICAL DATA:  Slurred speech EXAM: PORTABLE CHEST 1 VIEW COMPARISON:  05/27/2023 FINDINGS: Cardiac shadow is enlarged but stable. Pacing device is again seen. No sizable effusion is noted. Patchy atelectatic changes are noted in the left mid lung. No bony abnormality is seen. IMPRESSION: Patchy left basilar atelectasis Electronically Signed   By: Alcide Clever M.D.   On: 06/03/2023 03:16    Procedures Procedures    Medications Ordered in ED Medications  insulin  aspart (novoLOG) injection 8 Units (has no administration in time range)  sodium chloride 0.9 % bolus 1,000 mL (1,000 mLs Intravenous New Bag/Given 06/03/23 2130)    ED Course/ Medical Decision Making/ A&P Clinical Course as of 06/03/23 8657  Sat Jun 03, 2023  8469 Consult to hospitalist Dr. Janalyn Shy, who states AM team will see her this morning for admission. I appreciate her collaboration in the care of this patient.  [RS]    Clinical Course User Index [RS] , Eugene Gavia, PA-C                                Medical Decision Making 89 year old female who presents with facial twitching and stuttering speech.  Hypertensive intake, vital signs otherwise normal.  Cardiopulmonary exam without acute concern, abdominal exam is benign.  Neurologic exam with strength at patient's baseline, appeared with motor movement of the right at patient's baseline from prior CVA.  Persistent right-sided facial twitching and difficulty speaking.  The differential diagnosis for AMS is extensive and includes, but is not limited to:  Drug overdose - opioids, alcohol, sedatives, antipsychotics, drug withdrawal, others Metabolic: hypoxia, hypoglycemia, hyperglycemia, hypercalcemia, hypernatremia, hyponatremia, uremia, hepatic encephalopathy, hypothyroidism, hyperthyroidism, vitamin B12 or thiamine deficiency, carbon monoxide poisoning, Wilson's disease, Lactic acidosis, DKA/HHOS Infectious: meningitis, encephalitis, bacteremia/sepsis, urinary tract infection, pneumonia, neurosyphilis Structural: Space-occupying lesion, (brain tumor, subdural hematoma, hydrocephalus,) Vascular: stroke, subarachnoid hemorrhage, coronary ischemia, hypertensive encephalopathy, CNS vasculitis, thrombotic thrombocytopenic  purpura, disseminated intravascular coagulation, hyperviscosity Psychiatric: Schizophrenia, depression; Other: Seizure, hypothermia, heat stroke, ICU psychosis, dementia -"sundowning."    Amount and/or Complexity  of Data Reviewed Labs: ordered.    Details:  CBC without leukocytosis but with anemia with hemoglobin 11.3 at patient's baseline.  CMP with AKI with creatinine 2.7 increased from patient's baseline of 1.7, anion gap elevated to 17.  INR is normal, beta hydroxybutyrate catheter is normal.  Osmole's 311, VBG without acidosis UA without evidence of infection.  UDS pan negative.  Radiology: ordered.  Risk Prescription drug management. Decision regarding hospitalization.   Clinical picture consistent with HHS or DKA.  Suspect contribution of hyperglycemia to her presentation though this may not completely explain neurologic symptoms today.  Consult to Dr. Otelia Limes, neurologist to recommends reevaluation of facial twitching following rehydration and correction of her metabolic concerns.  If twitching persists would recommend EEG at that time.  I appreciate his collaboration in the care of this patient.  Patient will require admission to the hospital for management of AKI and ongoing neurologic evaluation.  Remains a GCS of 15 at this time, hemodynamically stable.  Patient is amenable to plan for management this time.  Admitted to hospital medicine service as above.  Jocilynn  voiced understanding of her medical evaluation and treatment plan. Each of their questions answered to their expressed satisfaction.  She is amenable to plan for admission.  This chart was dictated using voice recognition software, Dragon. Despite the best efforts of this provider to proofread and correct errors, errors may still occur which can change documentation meaning.   Final Clinical Impression(s) / ED Diagnoses Final diagnoses:  AKI (acute kidney injury) [facility])    Rx / DC Orders ED Discharge Orders     None         Paris Lore, PA-C 06/03/23 0656    , Eugene Gavia, PA-C 06/03/23 0702    , Eugene Gavia, PA-C 06/03/23 1610    Zadie Rhine, MD 06/04/23 [phone]

## 2023-06-03 NOTE — ED Triage Notes (Signed)
PT BIB by [facility] from [facility] with a c/o of right sided eye twitching and slurred speech. This is not baseline, PT usually speaks fine. A and O x 4. CBG was 450 at 0930 was given 5 of Humalog, 10 of Humalog at 2235 and when EMS was called at 0000, CBG was 459. After 500l of fluid , it was 298. Speech has been off for x 2 days. Normally wheelchair bound.

## 2023-06-03 NOTE — Progress Notes (Signed)
EEG complete - results pending 

## 2023-06-03 NOTE — Assessment & Plan Note (Signed)
Remote hx treated with coumadin in the past Continue eliquis BID

## 2023-06-03 NOTE — ED Notes (Signed)
Pt assisted to bedpan.  ?

## 2023-06-04 DIAGNOSIS — E870 Hyperosmolality and hypernatremia: Secondary | ICD-10-CM | POA: Diagnosis present

## 2023-06-04 DIAGNOSIS — N1832 Chronic kidney disease, stage 3b: Secondary | ICD-10-CM | POA: Diagnosis present

## 2023-06-04 DIAGNOSIS — Z66 Do not resuscitate: Secondary | ICD-10-CM | POA: Diagnosis present

## 2023-06-04 DIAGNOSIS — I69351 Hemiplegia and hemiparesis following cerebral infarction affecting right dominant side: Secondary | ICD-10-CM | POA: Diagnosis not present

## 2023-06-04 DIAGNOSIS — I480 Paroxysmal atrial fibrillation: Secondary | ICD-10-CM | POA: Diagnosis present

## 2023-06-04 DIAGNOSIS — F32A Depression, unspecified: Secondary | ICD-10-CM | POA: Diagnosis present

## 2023-06-04 DIAGNOSIS — E1140 Type 2 diabetes mellitus with diabetic neuropathy, unspecified: Secondary | ICD-10-CM | POA: Diagnosis present

## 2023-06-04 DIAGNOSIS — L89152 Pressure ulcer of sacral region, stage 2: Secondary | ICD-10-CM | POA: Diagnosis present

## 2023-06-04 DIAGNOSIS — I674 Hypertensive encephalopathy: Secondary | ICD-10-CM | POA: Diagnosis present

## 2023-06-04 DIAGNOSIS — E1165 Type 2 diabetes mellitus with hyperglycemia: Secondary | ICD-10-CM | POA: Diagnosis present

## 2023-06-04 DIAGNOSIS — R4781 Slurred speech: Secondary | ICD-10-CM | POA: Diagnosis not present

## 2023-06-04 DIAGNOSIS — E0590 Thyrotoxicosis, unspecified without thyrotoxic crisis or storm: Secondary | ICD-10-CM | POA: Diagnosis present

## 2023-06-04 DIAGNOSIS — E871 Hypo-osmolality and hyponatremia: Secondary | ICD-10-CM | POA: Diagnosis present

## 2023-06-04 DIAGNOSIS — E860 Dehydration: Secondary | ICD-10-CM | POA: Diagnosis present

## 2023-06-04 DIAGNOSIS — E1122 Type 2 diabetes mellitus with diabetic chronic kidney disease: Secondary | ICD-10-CM | POA: Diagnosis present

## 2023-06-04 DIAGNOSIS — I130 Hypertensive heart and chronic kidney disease with heart failure and stage 1 through stage 4 chronic kidney disease, or unspecified chronic kidney disease: Secondary | ICD-10-CM | POA: Diagnosis present

## 2023-06-04 DIAGNOSIS — F209 Schizophrenia, unspecified: Secondary | ICD-10-CM | POA: Diagnosis present

## 2023-06-04 DIAGNOSIS — I5042 Chronic combined systolic (congestive) and diastolic (congestive) heart failure: Secondary | ICD-10-CM | POA: Diagnosis present

## 2023-06-04 DIAGNOSIS — D496 Neoplasm of unspecified behavior of brain: Secondary | ICD-10-CM | POA: Diagnosis present

## 2023-06-04 DIAGNOSIS — D65 Disseminated intravascular coagulation [defibrination syndrome]: Secondary | ICD-10-CM | POA: Diagnosis present

## 2023-06-04 DIAGNOSIS — N179 Acute kidney failure, unspecified: Secondary | ICD-10-CM | POA: Diagnosis present

## 2023-06-04 DIAGNOSIS — F05 Delirium due to known physiological condition: Secondary | ICD-10-CM | POA: Diagnosis present

## 2023-06-04 DIAGNOSIS — E872 Acidosis, unspecified: Secondary | ICD-10-CM | POA: Diagnosis present

## 2023-06-04 DIAGNOSIS — E039 Hypothyroidism, unspecified: Secondary | ICD-10-CM | POA: Diagnosis present

## 2023-06-04 DIAGNOSIS — F0393 Unspecified dementia, unspecified severity, with mood disturbance: Secondary | ICD-10-CM | POA: Diagnosis present

## 2023-06-04 LAB — CBC
HCT: 31.4 % — ABNORMAL LOW (ref 36.0–46.0)
Hemoglobin: 10.1 g/dL — ABNORMAL LOW (ref 12.0–15.0)
MCH: 30.2 pg (ref 26.0–34.0)
MCHC: 32.2 g/dL (ref 30.0–36.0)
MCV: 94 fL (ref 80.0–100.0)
Platelets: 102 10*3/uL — ABNORMAL LOW (ref 150–400)
RBC: 3.34 MIL/uL — ABNORMAL LOW (ref 3.87–5.11)
RDW: 13.8 % (ref 11.5–15.5)
WBC: 3.6 10*3/uL — ABNORMAL LOW (ref 4.0–10.5)
nRBC: 0 % (ref 0.0–0.2)

## 2023-06-04 LAB — GLUCOSE, CAPILLARY
Glucose-Capillary: 187 mg/dL — ABNORMAL HIGH (ref 70–99)
Glucose-Capillary: 238 mg/dL — ABNORMAL HIGH (ref 70–99)
Glucose-Capillary: 161 mg/dL — ABNORMAL HIGH (ref 70–99)
Glucose-Capillary: 195 mg/dL — ABNORMAL HIGH (ref 70–99)

## 2023-06-04 LAB — BASIC METABOLIC PANEL WITH GFR
Anion gap: 9 (ref 5–15)
BUN: 43 mg/dL — ABNORMAL HIGH (ref 8–23)
CO2: 22 mmol/L (ref 22–32)
Calcium: 7.9 mg/dL — ABNORMAL LOW (ref 8.9–10.3)
Chloride: 107 mmol/L (ref 98–111)
Creatinine, Ser: 1.87 mg/dL — ABNORMAL HIGH (ref 0.44–1.00)
GFR, Estimated: 26 mL/min — ABNORMAL LOW (ref >60)
Glucose, Bld: 214 mg/dL — ABNORMAL HIGH (ref 70–99)
Potassium: 4.5 mmol/L (ref 3.5–5.1)
Sodium: 138 mmol/L (ref 135–145)

## 2023-06-04 MED ORDER — LACTATED RINGERS IV SOLN: INTRAVENOUS | Status: AC

## 2023-06-04 MED ORDER — AMLODIPINE BESYLATE 10 MG PO TABS: 10.0000 mg | ORAL_TABLET | Freq: Every day | ORAL | Status: DC

## 2023-06-04 MED ORDER — CALCIUM CARBONATE ANTACID 500 MG PO CHEW
1.0000 | CHEWABLE_TABLET | Freq: Two times a day (BID) | ORAL | Status: DC | PRN
  Administered 2023-06-04: 200 mg via ORAL
  Filled 2023-06-04: qty 1

## 2023-06-04 MED ORDER — INSULIN GLARGINE-YFGN 100 UNIT/ML ~~LOC~~ SOLN
25.0000 [IU] | Freq: Every day | SUBCUTANEOUS | Status: DC
  Administered 2023-06-04: 25 [IU] via SUBCUTANEOUS
  Filled 2023-06-04 (×2): qty 0.25

## 2023-06-04 MED ORDER — AMLODIPINE BESYLATE 5 MG PO TABS
5.0000 mg | ORAL_TABLET | Freq: Every day | ORAL | Status: DC
  Administered 2023-06-05: 5 mg via ORAL
  Filled 2023-06-04: qty 1

## 2023-06-04 MED ORDER — LACTATED RINGERS IV SOLN
INTRAVENOUS | Status: DC
Start: 2023-06-04 — End: 2023-06-04

## 2023-06-04 NOTE — Progress Notes (Signed)
[facility] night cross cover note:   I was notified by RN of the patient's request for medication for heartburn.  I subsequently ordered prn Tums for heartburn/indigestion.    Newton Pigg, DO Hospitalist

## 2023-06-04 NOTE — Plan of Care (Signed)

## 2023-06-04 NOTE — Evaluation (Signed)
Occupational Therapy Evaluation Patient Details Name: [name] MRN: [id number] DOB: June 03, 1933 Today's Date: 06/04/2023   History of Present Illness 89 y.o. female admitted on 06/03/23 for R sided facial twitching and slurred speech.  EEG stable, head CT unremarkable.  Blood sugars are  high and likely causing symptoms.  Pt with significant PMH of anemai, breast CA, CHF, CKD, CVA with R sided weakness, DVT, essential HTN, LBBB, mitral regurgitation, obesity, pacemaker, SVT s/p ablation, and thrombocytopenia.   Clinical Impression   Pt received in recliner. Pt reports she is from SNF and enjoyed her social activities. She reports staff assisted her with ADL and IADL. She reports she had a little assistance from staff during w/c transfers. Pt currently requires modA for LB dressing and minA for transfer recliner<>EOB to simulate toilet transfer and w/c transfer. Pt would continue to benefit from skilled OT services to maximize safety and independence to maximize safety and independence with ADL/IADL and functional mobility. Patient will benefit from continued inpatient follow up therapy, <3 hours/day     If plan is discharge home, recommend the following: A little help with walking and/or transfers;A lot of help with bathing/dressing/bathroom    Functional Status Assessment  Patient has had a recent decline in their functional status and demonstrates the ability to make significant improvements in function in a reasonable and predictable amount of time.  Equipment Recommendations  Other (comment) (defer)    Recommendations for Other Services       Precautions / Restrictions Precautions Precautions: Fall Precaution Comments: right UE/LE weakness from PMH stroke Restrictions Weight Bearing Restrictions: No      Mobility Bed Mobility               General bed mobility comments: in recliner upon arrival, returned to recliner following session    Transfers Overall transfer  level: Needs assistance   Transfers: Sit to/from Stand, Bed to chair/wheelchair/BSC Sit to Stand: Min assist     Step pivot transfers: Min assist     General transfer comment: minA to powerup into standing and for stability during pivot transfer      Balance Overall balance assessment: Needs assistance Sitting-balance support: No upper extremity supported, Bilateral upper extremity supported, Single extremity supported Sitting balance-Leahy Scale: Fair Sitting balance - Comments: close supervision EOB   Standing balance support: Single extremity supported Standing balance-Leahy Scale: Poor Standing balance comment: needs external support in standing.                           ADL either performed or assessed with clinical judgement   ADL Overall ADL's : Needs assistance/impaired Eating/Feeding: Set up;Sitting   Grooming: Set up;Sitting;Wash/dry face Grooming Details (indicate cue type and reason): while sitting EOb Upper Body Bathing: Set up;Sitting   Lower Body Bathing: Moderate assistance;Sit to/from stand   Upper Body Dressing : Minimal assistance;Sitting Upper Body Dressing Details (indicate cue type and reason): minA due to shoulder ROM limitations Lower Body Dressing: Moderate assistance;Sit to/from stand   Toilet Transfer: Tax adviser Details (indicate cue type and reason): simulated from EOB to recliner Toileting- Clothing Manipulation and Hygiene: Moderate assistance;Sit to/from stand       Functional mobility during ADLs: Minimal assistance General ADL Comments: pt limited by generalized weakness, R shoulder limitations and weakness     Vision         Perception         Praxis  Pertinent Vitals/Pain Pain Assessment Pain Assessment: Faces Faces Pain Scale: Hurts little more Pain Location: right knee (arthritis) Pain Descriptors / Indicators: Grimacing, Guarding Pain Intervention(s): Limited activity within  patient's tolerance, Monitored during session     Extremity/Trunk Assessment Upper Extremity Assessment Upper Extremity Assessment: RUE deficits/detail;LUE deficits/detail RUE Deficits / Details: hx of right sided weakness from previous CVA. Rshoulder flexion <90*, strength 3+/5 grossly RUE Sensation: WNL RUE Coordination: WNL LUE Deficits / Details: shoulder flexion ~140*, strength 4/5 grossly LUE Sensation: WNL LUE Coordination: WNL   Lower Extremity Assessment Lower Extremity Assessment: Defer to PT evaluation RLE Deficits / Details: right leg is weaker than left leg and is generally 3/5 ankle, 3/5 knee, 3-/5 hip flexion LLE Deficits / Details: left leg mildly stronger than R, 3/5 ankle 3/5 knee, 3/5 hip per gross assessment in supine and sitting.   Cervical / Trunk Assessment Cervical / Trunk Assessment: Normal   Communication Communication Communication: Difficulty communicating thoughts/reduced clarity of speech (expressive difficulties)   Cognition Arousal: Alert Behavior During Therapy: WFL for tasks assessed/performed Overall Cognitive Status: Within Functional Limits for tasks assessed                                       General Comments       Exercises     Shoulder Instructions      Home Living Family/patient expects to be discharged to:: Skilled nursing facility                                        Prior Functioning/Environment Prior Level of Function : Needs assist       Physical Assist : Mobility (physical)     Mobility Comments: Pt reports she can normally transfer to her chair, she preforms exercises in the therapy gym from the [facility] and normally can get herself to the bathroom for toileting.          OT Problem List: Decreased activity tolerance;Impaired balance (sitting and/or standing);Decreased safety awareness      OT Treatment/Interventions: Self-care/ADL training;DME and/or AE instruction;Patient/family  education;Balance training    OT Goals(Current goals can be found in the care plan section) Acute Rehab OT Goals Patient Stated Goal: to return to baseline OT Goal Formulation: With patient Time For Goal Achievement: 06/18/23 Potential to Achieve Goals: Good ADL Goals Pt Will Perform Lower Body Dressing: with min assist;sit to/from stand Pt Will Transfer to Toilet: with contact guard assist;stand pivot transfer  OT Frequency: Min 1X/week    Co-evaluation              AM-PAC OT "6 Clicks" Daily Activity     Outcome Measure Help from another person eating meals?: A Little Help from another person taking care of personal grooming?: A Little Help from another person toileting, which includes using toliet, bedpan, or urinal?: A Little Help from another person bathing (including washing, rinsing, drying)?: A Lot Help from another person to put on and taking off regular upper body clothing?: A Little Help from another person to put on and taking off regular lower body clothing?: A Lot 6 Click Score: 16   End of Session Nurse Communication: Mobility status  Activity Tolerance: Patient tolerated treatment well Patient left: in chair;with call bell/phone within reach;with chair alarm set  OT Visit Diagnosis: Other  abnormalities of gait and mobility (R26.89);Muscle weakness (generalized) (M62.81)                Time: 4540-9811 OT Time Calculation (min): 19 min Charges:  OT General Charges $OT Visit: 1 Visit OT Evaluation $OT Eval Moderate Complexity: 1 Mod   OTR/L Acute Rehabilitation Services Office: [phone]   Providence Crosby 06/04/2023, 1:07 PM

## 2023-06-04 NOTE — Plan of Care (Signed)
  Problem: Coping: Goal: Ability to adjust to condition or change in health will improve Outcome: Progressing   Problem: Health Behavior/Discharge Planning: Goal: Ability to identify and utilize available resources and services will improve Outcome: Progressing Goal: Ability to manage health-related needs will improve Outcome: Progressing   Problem: Metabolic: Goal: Ability to maintain appropriate glucose levels will improve Outcome: Progressing   Problem: Education: Goal: Knowledge of General Education information will improve Description: Including pain rating scale, medication(s)/side effects and non-pharmacologic comfort measures Outcome: Progressing   Problem: Health Behavior/Discharge Planning: Goal: Ability to manage health-related needs will improve Outcome: Progressing   Problem: Activity: Goal: Risk for activity intolerance will decrease Outcome: Progressing

## 2023-06-04 NOTE — Evaluation (Signed)
Physical Therapy Evaluation Patient Details Name: [name] MRN: [id number] DOB: August 06, 1933 Today's Date: 06/04/2023  History of Present Illness  89 y.o. female admitted on 06/03/23 for R sided facial twitching and slurred speech.  EEG stable, head CT unremarkable.  Blood sugars are  high and likely causing symptoms.  Pt with significant PMH of anemai, breast CA, CHF, CKD, CVA with R sided weakness, DVT, essential HTN, LBBB, mitral regurgitation, obesity, pacemaker, SVT s/p ablation, and thrombocytopenia.  Clinical Impression  Pt is from SNF returning to SNF and was active in social activities and exercise at her SNF.  She is min assist overall for bed mobility and transfers and functioned PTA from [facility] level.  She would benefit from continued therapy during her acute hospital stay to ensure she returns to her baseline level of functioning.   PT to follow acutely for deficits listed below.       If plan is discharge home, recommend the following: A little help with walking and/or transfers;A little help with bathing/dressing/bathroom;Assistance with cooking/housework;Direct supervision/assist for medications management;Direct supervision/assist for financial management;Assist for transportation;Help with stairs or ramp for entrance   Can travel by private vehicle   No    Equipment Recommendations None recommended by PT  Recommendations for Other Services       Functional Status Assessment Patient has had a recent decline in their functional status and demonstrates the ability to make significant improvements in function in a reasonable and predictable amount of time.     Precautions / Restrictions Precautions Precautions: Fall Precaution Comments: right UE/LE weakness from PMH stroke Restrictions Weight Bearing Restrictions: No      Mobility  Bed Mobility Overal bed mobility: Needs Assistance Bed Mobility: Supine to Sit     Supine to sit: Min assist, Used rails     General  bed mobility comments: Min hand held assist to come all the way up to sitting EOB.  Extra time needed to complete task with use of railing.    Transfers Overall transfer level: Needs assistance   Transfers: Sit to/from Stand, Bed to chair/wheelchair/BSC Sit to Stand: Min assist   Step pivot transfers: Min assist       General transfer comment: Min assist to stand and pivot to pt's right (weak side) with uncontrolled descent to sit once turned around.    Ambulation/Gait               General Gait Details: pt is non ambulatory at baseline  Stairs            Wheelchair Mobility     Tilt Bed    Modified Rankin (Stroke Patients Only) Modified Rankin (Stroke Patients Only) Pre-Morbid Rankin Score: Moderately severe disability Modified Rankin: Moderately severe disability     Balance Overall balance assessment: Needs assistance Sitting-balance support: No upper extremity supported, Bilateral upper extremity supported, Single extremity supported Sitting balance-Leahy Scale: Fair Sitting balance - Comments: close supervision EOB   Standing balance support: Single extremity supported Standing balance-Leahy Scale: Poor Standing balance comment: needs external support in standing.                             Pertinent Vitals/Pain Pain Assessment Pain Assessment: Faces Faces Pain Scale: Hurts little more Pain Location: right knee (arthritis) Pain Descriptors / Indicators: Grimacing, Guarding Pain Intervention(s): Limited activity within patient's tolerance, Monitored during session, Repositioned    Home Living Family/patient expects to be discharged to::  Skilled nursing facility                        Prior Function Prior Level of Function : Needs assist       Physical Assist : Mobility (physical)     Mobility Comments: Pt reports she can normally transfer to her chair, she preforms exercises in the therapy gym from the [facility] and  normally can get herself to the bathroom for toileting.       Extremity/Trunk Assessment   Upper Extremity Assessment Upper Extremity Assessment: Defer to OT evaluation    Lower Extremity Assessment Lower Extremity Assessment: RLE deficits/detail;LLE deficits/detail RLE Deficits / Details: right leg is weaker than left leg and is generally 3/5 ankle, 3/5 knee, 3-/5 hip flexion LLE Deficits / Details: left leg mildly stronger than R, 3/5 ankle 3/5 knee, 3/5 hip per gross assessment in supine and sitting.    Cervical / Trunk Assessment Cervical / Trunk Assessment: Normal  Communication   Communication Communication: Difficulty communicating thoughts/reduced clarity of speech (expressive difficulties)  Cognition Arousal: Alert Behavior During Therapy: WFL for tasks assessed/performed Overall Cognitive Status: Within Functional Limits for tasks assessed                                          General Comments      Exercises General Exercises - Lower Extremity Ankle Circles/Pumps: AROM, Both, 10 reps Long Arc Quad: AROM, Both, 10 reps Hip Flexion/Marching: AROM, Both, 10 reps   Assessment/Plan    PT Assessment Patient needs continued PT services  PT Problem List Decreased strength;Decreased range of motion;Decreased activity tolerance;Decreased balance;Decreased mobility;Decreased knowledge of use of DME;Obesity;Pain       PT Treatment Interventions DME instruction;Gait training;Functional mobility training;Therapeutic activities;Therapeutic exercise;Balance training;Patient/family education;Modalities;Wheelchair mobility training    PT Goals (Current goals can be found in the Care Plan section)  Acute Rehab PT Goals Patient Stated Goal: to return to her SNF and get back to doing her biking and drum/music group PT Goal Formulation: With patient Time For Goal Achievement: 06/18/23 Potential to Achieve Goals: Good    Frequency Min 1X/week      Co-evaluation               AM-PAC PT "6 Clicks" Mobility  Outcome Measure Help needed turning from your back to your side while in a flat bed without using bedrails?: A Little Help needed moving from lying on your back to sitting on the side of a flat bed without using bedrails?: A Little Help needed moving to and from a bed to a chair (including a wheelchair)?: A Little Help needed standing up from a chair using your arms (e.g., wheelchair or bedside chair)?: A Little Help needed to walk in hospital room?: Total Help needed climbing 3-5 steps with a railing? : Total 6 Click Score: 14    End of Session Equipment Utilized During Treatment: Gait belt Activity Tolerance: Patient limited by fatigue;Patient limited by pain Patient left: in chair;with call bell/phone within reach;Other (comment);with nursing/sitter in room (no chair alarms on the floor right now, RN and RN tech made aware) Nurse Communication: Mobility status PT Visit Diagnosis: Muscle weakness (generalized) (M62.81);Difficulty in walking, not elsewhere classified (R26.2);Pain Pain - Right/Left: Right Pain - part of body: Knee    Time: 1032-1100 PT Time Calculation (min) (ACUTE ONLY): 28 min  Charges:   PT Evaluation $PT Eval Moderate Complexity: 1 Mod PT Treatments $Therapeutic Activity: 8-22 mins PT General Charges $$ ACUTE PT VISIT: 1 Visit         Corinna Capra, PT, DPT  Acute Rehabilitation Secure chat is best for contact #[phone] office

## 2023-06-04 NOTE — Progress Notes (Signed)
SLP Cancellation Note  Patient Details Name: [name] MRN: [id number] DOB: 1933-08-14   Cancelled treatment:       Reason Eval/Treat Not Completed: Patient at procedure or test/unavailable (Pt being transferred to the bedside commode at the time when SLP attempted to see pt. SLP will follow up on subsequent date.)   I. Vear Clock, MS, CCC-SLP Acute Rehabilitation Services Office number [phone]  Scheryl Marten 06/04/2023, 3:57 PM

## 2023-06-04 NOTE — Progress Notes (Signed)
PROGRESS NOTE                                                                                                                                                                                                             Patient Demographics:    [name], is a 89 y.o. female, DOB - 07-21-1933, YQM:[id number]  Outpatient Primary MD for the patient is Pcp, No    LOS - 0  Admit date - 06/03/2023    Chief Complaint  Patient presents with   Hyperglycemia       Brief Narrative (HPI from H&P)   89 y.o. female with medical history significant of systolic CHF, CKD stage III, hx of CVA, T2DM, hx of breast cancer, PAF on eliquis, CHB s/p PPM, who presented to ED with concerns of right sided facial twitching and slurred speech that started last night, also her sugars were running extremely high for the last 2 to 3 days at the nursing facility.  Per patient and family she has had issues with facial twitching and slurred speech in the past and has seen neurology in the past however these symptoms are waxing and waning, she came to the hospital was seen by neurologist in the ER who requested that a EEG be obtained and if stable patient needs no further neurological workup, according to the neurologist most of her symptoms were due to uncontrolled high sugars.   Subjective:    [name] today has, No headache, No chest pain, No abdominal pain - No Nausea, No new weakness tingling or numbness, no shortness of breath, facial twitching has improved according to the patient, speech at baseline according to the patient   Assessment  & Plan :    Slurred speech and right facial twitching -  89 year old presenting with right sided facial spasms and worsening aphasia, drowsiness and confusion, she has had some facial spasms on the right side and dysarthria off and on for a while for which she has seen neurology in the past, seen by neurology and ER  thought to have exacerbation of chronic symptoms due to high sugars.  EEG stable, head CT unremarkable, no headache or focal deficits.  Control sugars, PT OT speech eval and monitor.  Acute renal failure superimposed on stage 3b chronic kidney disease (HCC) Baseline creatinine of 1.6-1.7 AKI due  to uncontrolled sugars, continue gentle IV hydration and monitor.  Hold diuretics and nephrotoxins.  HTN (hypertension) Blood pressure regimen adjusted further on 06/04/2023 monitor  Paroxysmal atrial fibrillation (HCC) Continue eliquis  Metoprolol changed to coreg 6.25mg  BID  Continue amiodorone   Combined congestive systolic and diastolic heart failure (HCC) Just hospitalized for CHF exacerbation and diuresed with medication change Echo in 12/2022 showed improved EF from 30% to 45-50%. Diastolic indeterminate.  Mildly dehydrated gentle hydration.  Continue amiodarone and Coreg, ACE/ARB held due to AKI.  hx of DVT Remote hx treated with coumadin in the past Continue eliquis BID   GERD (gastroesophageal reflux disease) Continue omeprazole, recently increased to BID a few days ago at hospital D/C   DM (diabetes mellitus) (HCC) A1C this month of 8.3 Apparently long-acting insulin was somehow missed at the nursing home for several days, placed on long-acting insulin and sliding scale.  Monitor and adjust.   CBG (last 3)  Recent Labs    06/03/23 1136 06/03/23 1615 06/04/23 0846  GLUCAP 105* 254* 187*         Condition - Extremely Guarded  Family Communication  : Daughter [phone] and granddaughter in detail on 06/04/2023 at 9:05 AM  Code Status :  DNR  Consults  :  None  PUD Prophylaxis : PPI   Procedures  :     CT - Non acute  EEG - Non acute      Disposition Plan  :    Status is: Observation  DVT Prophylaxis  :    apixaban (ELIQUIS) tablet 2.5 mg Start: 06/03/23 1245 apixaban (ELIQUIS) tablet 2.5 mg     Lab Results  Component Value Date   PLT 102 (L)  06/04/2023    Diet :  Diet Order             Diet heart healthy/carb modified Room service appropriate? Yes; Fluid consistency: Thin  Diet effective now                    Inpatient Medications  Scheduled Meds:  [START ON 06/05/2023] amiodarone  200 mg Oral Daily   [START ON 06/05/2023] amLODipine  5 mg Oral Daily   apixaban  2.5 mg Oral BID   carvedilol  6.25 mg Oral BID   escitalopram  5 mg Oral Daily   insulin aspart  0-9 Units Subcutaneous TID WC   insulin glargine-yfgn  25 Units Subcutaneous QHS   oxybutynin  5 mg Oral Daily   pantoprazole  40 mg Oral q morning   potassium chloride SA  20 mEq Oral Daily   pregabalin  50 mg Oral q morning   pregabalin  75 mg Oral QHS   senna  2 tablet Oral QHS   Continuous Infusions:  lactated ringers     PRN Meds:.acetaminophen **OR** acetaminophen, ondansetron **OR** ondansetron (ZOFRAN) IV  Antibiotics  :    Anti-infectives (From admission, onward)    None         Objective:   Vitals:   06/03/23 2100 06/04/23 0000 06/04/23 0400 06/04/23 0800  BP:  (!) 103/53 (!) 140/67   Pulse: 60 60 60   Resp: 20 20 18    Temp:  97.9 F (36.6 C)  97.7 F (36.5 C)  TempSrc:  Oral  Oral  SpO2: 97% 98% 97%   Weight:      Height:        Wt Readings from Last 3 Encounters:  06/03/23 90.3 kg  04/10/23 90.3  kg  02/07/23 89.9 kg     Intake/Output Summary (Last 24 hours) at 06/04/2023 0926 Last data filed at 06/04/2023 0400 Gross per 24 hour  Intake --  Output 1500 ml  Net -1500 ml     Physical Exam  Awake Alert, No new F.N deficits, mild intermittent right-sided facial twitching, some dysarthria [place].AT,PERRAL Supple Neck, No JVD,   Symmetrical Chest wall movement, Good air movement bilaterally, CTAB RRR,No Gallops,Rubs or new Murmurs,  +ve B.Sounds, Abd Soft, No tenderness,   No Cyanosis, Clubbing or edema     RN pressure injury documentation: Pressure Injury 02/09/22 Coccyx Mid Stage 2 -  Partial thickness loss of  dermis presenting as a shallow open injury with a red, pink wound bed without slough. Abrasion (Active)  02/09/22 2015  Location: Coccyx  Location Orientation: Mid  Staging: Stage 2 -  Partial thickness loss of dermis presenting as a shallow open injury with a red, pink wound bed without slough.  Wound Description (Comments): Abrasion  Present on Admission: No      Data Review:    Recent Labs  Lab 06/03/23 0312 06/03/23 0321 06/04/23 0146  WBC 5.5  --  3.6*  HGB 11.3* 11.2* 10.1*  HCT 35.7* 33.0* 31.4*  PLT 112*  --  102*  MCV 95.2  --  94.0  MCH 30.1  --  30.2  MCHC 31.7  --  32.2  RDW 13.8  --  13.8  LYMPHSABS 0.6*  --   --   MONOABS 0.6  --   --   EOSABS 0.1  --   --   BASOSABS 0.0  --   --     Recent Labs  Lab 06/03/23 0312 06/03/23 0321 06/03/23 1054 06/04/23 0146  NA 136 137  --  138  K 4.2 4.0  --  4.5  CL 99  --   --  107  CO2 20*  --   --  22  ANIONGAP 17*  --   --  9  GLUCOSE 246*  --   --  214*  BUN 57*  --   --  43*  CREATININE 2.77*  --   --  1.87*  AST 23  --   --   --   ALT 19  --   --   --   ALKPHOS 84  --   --   --   BILITOT 0.4  --   --   --   ALBUMIN 3.4*  --   --   --   INR 1.1  --   --   --   TSH  --   --  0.595  --   MG  --   --  2.2  --   CALCIUM 8.4*  --   --  7.9*      Recent Labs  Lab 06/03/23 0312 06/03/23 1054 06/04/23 0146  INR 1.1  --   --   TSH  --  0.595  --   MG  --  2.2  --   CALCIUM 8.4*  --  7.9*    Recent Labs  Lab 06/03/23 0312 06/04/23 0146  WBC 5.5 3.6*  PLT 112* 102*  CREATININE 2.77* 1.87*    ------------------------------------------------------------------------------------------------------------------ Lab Results  Component Value Date   CHOL 195 08/15/2018   HDL 81 08/15/2018   LDLCALC 96 08/15/2018   TRIG 56 01/02/2019   CHOLHDL 2.4 08/15/2018    Lab Results  Component Value Date  HGBA1C 10.2 (H) 02/08/2022    Recent Labs    06/03/23 1054  TSH 0.595    ------------------------------------------------------------------------------------------------------------------ Cardiac Enzymes No results for input(s): "CKMB", "TROPONINI", "MYOGLOBIN" in the last 168 hours.  Invalid input(s): "CK"  Micro Results No results found for this or any previous visit (from the past 240 hour(s)).  Radiology Reports EEG adult  Result Date: 06/03/2023 Charlsie Quest, MD     06/03/2023 12:43 PM Patient Name: DANILYNN LANPHIER MRN: [id number] Epilepsy Attending: Charlsie Quest Referring Physician/Provider: Orland Mustard, MD Date: 06/03/2023 Duration: 22.38 mins Patient history: 89yo female with right sided facial twitching and slurred speech. EEG to evaluate for seizure. Level of alertness: Awake AEDs during EEG study: None Technical aspects: This EEG study was done with scalp electrodes positioned according to the 10-20 International system of electrode placement. Electrical activity was reviewed with band pass filter of 1-70Hz , sensitivity of 7 uV/mm, display speed of 36mm/sec with a 60Hz  notched filter applied as appropriate. EEG data were recorded continuously and digitally stored.  Video monitoring was available and reviewed as appropriate. Description: The posterior dominant rhythm consists of 9-10 Hz activity of moderate voltage (25-35 uV) seen predominantly in posterior head regions, symmetric and reactive to eye opening and eye closing. Generalized sharp transients were noted. Physiologic photic driving was not seen during photic stimulation.  Hyperventilation was not performed.   Of note, study was technically difficult due to significant myogenic artifact. IMPRESSION: This technically difficult study is within normal limits. No seizures or epileptiform discharges were seen throughout the recording. A normal interictal EEG does not exclude the diagnosis of epilepsy. Priyanka Annabelle Harman   CT HEAD WO CONTRAST ( )  Result Date: 06/03/2023 CLINICAL DATA:  Neuro  deficit, acute, stroke suspected. EXAM: CT HEAD WITHOUT CONTRAST TECHNIQUE: Contiguous axial images were obtained from the base of the skull through the vertex without intravenous contrast. RADIATION DOSE REDUCTION: This exam was performed according to the departmental dose-optimization program which includes automated exposure control, adjustment of the mA and/or kV according to patient size and/or use of iterative reconstruction technique. COMPARISON:  Head CT 09/06/2020 FINDINGS: Brain: Streak artifacts extend across most of the images, most likely due to patient positioning. There is mild cerebral atrophy, small-vessel disease, and atrophic ventriculomegaly. Cerebellum and brainstem are unremarkable. No asymmetry worrisome for an acute infarct, hemorrhage or mass is seen through the artifacts. There is senescent mineralization in both basal ganglia. There is no midline shift. Basal cisterns are clear. Dural calcifications are scattered along the falx. Vascular: There are patchy calcifications in the carotid siphons. There are no hyperdense central vessels. Skull: Negative for fractures or focal lesions. Sinuses/Orbits: No acute finding. Negative orbits apart from old right lens replacement. Clear sinuses and mastoids. Other: Midline nasal septum. IMPRESSION: Limited exam due to streak artifacts. No convincing acute intracranial CT abnormality. Chronic changes. Electronically Signed   By: Stephanie Bar M.D.   On: 06/03/2023 03:50   DG Chest Portable 1 View  Result Date: 06/03/2023 CLINICAL DATA:  Slurred speech EXAM: PORTABLE CHEST 1 VIEW COMPARISON:  05/27/2023 FINDINGS: Cardiac shadow is enlarged but stable. Pacing device is again seen. No sizable effusion is noted. Patchy atelectatic changes are noted in the left mid lung. No bony abnormality is seen. IMPRESSION: Patchy left basilar atelectasis Electronically Signed   By: Alcide Clever M.D.   On: 06/03/2023 03:16      Signature  -   Susa Raring M.D  on 06/04/2023 at 9:26 AM   -  To page go to [email or web address]

## 2023-06-05 DIAGNOSIS — R4781 Slurred speech: Secondary | ICD-10-CM | POA: Diagnosis not present

## 2023-06-05 LAB — GLUCOSE, CAPILLARY
Glucose-Capillary: 153 mg/dL — ABNORMAL HIGH (ref 70–99)
Glucose-Capillary: 258 mg/dL — ABNORMAL HIGH (ref 70–99)

## 2023-06-05 MED ORDER — INSULIN LISPRO (1 UNIT DIAL) 100 UNIT/ML (KWIKPEN): PEN_INJECTOR | SUBCUTANEOUS | Status: AC

## 2023-06-05 MED ORDER — INSULIN GLARGINE 100 UNITS/ML SOLOSTAR PEN: 23.0000 [IU] | PEN_INJECTOR | Freq: Every day | SUBCUTANEOUS | Status: DC

## 2023-06-05 MED ORDER — LOSARTAN POTASSIUM 25 MG PO TABS: 25.0000 mg | ORAL_TABLET | Freq: Every day | ORAL | Status: DC

## 2023-06-05 NOTE — Discharge Summary (Signed)
[name] HKV:[id number] DOB: 28-Nov-1932 DOA: 06/03/2023  PCP: Pcp, No  Admit date: 06/03/2023  Discharge date: 06/05/2023  Admitted From: SNF   Disposition:  SNF   Recommendations for Outpatient Follow-up:   Follow up with PCP in 1-2 weeks  PCP Please obtain BMP/CBC, 2 view CXR in 1week,  (see Discharge instructions)   PCP Please follow up on the following pending results:     Home Health: None   Equipment/Devices: None  Consultations: Neuro Discharge Condition: Stable    CODE STATUS: Full    Diet Recommendation: Carb modified dysphagia 3 diet with feeding assistance and aspiration precautions, check CBGs q. [facility] S    Chief Complaint  Patient presents with   Hyperglycemia     Brief history of present illness from the day of admission and additional interim summary    89 y.o. female with medical history significant of systolic CHF, CKD stage III, hx of CVA, T2DM, hx of breast cancer, PAF on eliquis, CHB s/p PPM, who presented to ED with concerns of right sided facial twitching and slurred speech that started last night, also her sugars were running extremely high for the last 2 to 3 days at the nursing facility.  Per patient and family she has had issues with facial twitching and slurred speech in the past and has seen neurology in the past however these symptoms are waxing and waning, she came to the hospital was seen by neurologist in the ER who requested that a EEG be obtained and if stable patient needs no further neurological workup, according to the neurologist most of her symptoms were due to uncontrolled high sugars.                                                                  Hospital Course   Slurred speech and right facial twitching -  89 year old presenting with right sided facial spasms and  worsening aphasia, drowsiness and confusion, she has had some facial spasms on the right side and dysarthria off and on for a while for which she has seen neurology in the past, seen by neurology and ER thought to have exacerbation of chronic symptoms due to high sugars.  EEG stable, head CT unremarkable, no headache or focal deficits.  Control sugars, PT OT speech eval done, back to her baseline.  Her chronic symptoms were exacerbated  due to dehydration and uncontrolled high blood sugars.  They have resolved.   Acute renal failure superimposed on stage 3b chronic kidney disease (HCC) Baseline creatinine of 1.6-1.7 AKI due to uncontrolled sugars, hydrated, resolved.   HTN (hypertension) Blood pressure regimen stable, resume ARB - in 2 days.   Paroxysmal atrial fibrillation (HCC) Continue eliquis  Metoprolol changed to coreg 6.25mg  BID  Continue amiodorone  Combined congestive systolic and diastolic heart failure (HCC) Just hospitalized for CHF exacerbation and diuresed with medication change Echo in 12/2022 showed improved EF from 30% to 45-50%. Diastolic indeterminate.  Mildly dehydrated gentle hydration.  Continue amiodarone and Coreg, ACE/ARB held due to AKI.  Home in 2 days.   Hx of DVT Remote hx treated with coumadin in the past Continue eliquis BID    GERD (gastroesophageal reflux disease) Continue omeprazole, recently increased to BID a few days ago at hospital D/C    DM (diabetes mellitus) (HCC) A1C this month of 8.3 Apparently long-acting insulin was somehow missed at the nursing home for several days, placed on long-acting insulin and sliding scale.  Will on present regimen discharge on the present regimen, request SNF to check CBGs q. [facility], continue present insulin regimen as below.   CBG (last 3)   CBG (last 3)  Recent Labs    06/04/23 1630 06/04/23 2146 06/05/23 0730  GLUCAP 161* 195* 153*    Discharge diagnosis     Principal Problem:   Slurred speech and  right facial twitching Active Problems:   Acute renal failure superimposed on stage 3b chronic kidney disease (HCC)   DM (diabetes mellitus) (HCC)   HTN (hypertension)   Paroxysmal atrial fibrillation (HCC)   Combined congestive systolic and diastolic heart failure (HCC)   hx of DVT   GERD (gastroesophageal reflux disease)    Discharge instructions    Discharge Instructions     Discharge instructions   Complete by: As directed    Follow with Primary MD Pcp, No in 7 days   Get CBC, CMP, 2 view Chest X ray -  checked next visit with your primary MD or SNF MD   Activity: As tolerated with Full fall precautions use walker/cane & assistance as needed  Disposition SNF  Diet: Carb Modified dysphagia 3 diet with feeding assistance and aspiration precautions.  Check CBGs q. [facility] S  Special Instructions: If you have smoked or chewed Tobacco  in the last 2 yrs please stop smoking, stop any regular Alcohol  and or any Recreational drug use.  On your next visit with your primary care physician please Get Medicines reviewed and adjusted.  Please request your Prim.MD to go over all Hospital Tests and Procedure/Radiological results at the follow up, please get all Hospital records sent to your Prim MD by signing hospital release before you go home.  If you experience worsening of your admission symptoms, develop shortness of breath, life threatening emergency, suicidal or homicidal thoughts you must seek medical attention immediately by calling 911 or calling your MD immediately  if symptoms less severe.  You Must read complete instructions/literature along with all the possible adverse reactions/side effects for all the Medicines you take and that have been prescribed to you. Take any new Medicines after you have completely understood and accpet all the possible adverse reactions/side effects.   Increase activity slowly   Complete by: As directed        Discharge Medications    Allergies as of 06/05/2023       Reactions   Aspirin Itching   Penicillins Itching, Rash   DID THE REACTION INVOLVE: Swelling of the face/tongue/throat, SOB, or low BP? Yes Sudden or severe rash/hives, skin peeling, or the inside of the mouth or nose? No Did it require medical treatment? Yes When did it last happen? "More than 10 years ago" If all above answers are "NO", may proceed with cephalosporin use. Tolerated  full course of Unasyn in 2021.   Remeron [mirtazapine] Other (See Comments)   Unknown reaction        Medication List     TAKE these medications    acetaminophen 650 MG CR tablet Commonly known as: TYLENOL Take 650 mg by mouth every 8 (eight) hours.   amiodarone 200 MG tablet Commonly known as: PACERONE Take one tablet by mouth daily Monday-Friday. Do NOT take Saturday and Sunday   amLODipine 5 MG tablet Commonly known as: NORVASC Take 5 mg by mouth daily.   carvedilol 6.25 MG tablet Commonly known as: COREG Take 6.25 mg by mouth 2 (two) times daily.   Docusate Sodium 100 MG capsule Take 100 mg by mouth every morning.   Dulaglutide 3 MG/0.5ML Sopn Inject 1.5 mg into the skin every 7 (seven) days. tuesday What changed: Another medication with the same name was removed. Continue taking this medication, and follow the directions you see here.   Eliquis 2.5 MG Tabs tablet Generic drug: apixaban TAKE 1 TABLET(2.5 MG) BY MOUTH TWICE DAILY What changed: See the new instructions.   escitalopram 5 MG tablet Commonly known as: LEXAPRO Take 5 mg by mouth daily.   guaifenesin 400 MG Tabs tablet Commonly known as: HUMIBID E Take 400 mg by mouth. Take 1 1/2 tablets by mouth at bedtime   insulin glargine 100 unit/mL Sopn Commonly known as: LANTUS Inject 23 Units into the skin at bedtime.   insulin lispro 100 UNIT/ML KwikPen Commonly known as: HumaLOG KwikPen Before each meal 3 times a day, 140-199 - 2 units, 200-250 - 4 units, 251-299 - 6 units,   300-349 - 8 units,  350 or above 10 units.   loratadine 10 MG tablet Commonly known as: CLARITIN Take 10 mg by mouth daily.   losartan 25 MG tablet Commonly known as: COZAAR Take 1 tablet (25 mg total) by mouth daily. Start taking on: June 07, 2023 What changed: These instructions start on June 07, 2023. If you are unsure what to do until then, ask your doctor or other care provider.   omeprazole 20 MG tablet Commonly known as: PRILOSEC OTC Take 20 mg by mouth every morning.   oxybutynin 5 MG tablet Commonly known as: DITROPAN TAKE 1 TABLET(5 MG) BY MOUTH TWICE DAILY What changed: See the new instructions.   Potassium Chloride ER 20 MEQ Tbcr Take 20 mEq by mouth daily.   pregabalin 75 MG capsule Commonly known as: LYRICA Take 1 capsule (75 mg total) by mouth at bedtime.   pregabalin 50 MG capsule Commonly known as: LYRICA Take 50 mg by mouth every morning.   senna 8.6 MG tablet Commonly known as: SENOKOT Take 2 tablets by mouth at bedtime.   torsemide 20 MG tablet Commonly known as: DEMADEX Take 40 mg every morning.  Take 20-40 mg daily every afternoon as needed for swelling or shortness of breath What changed:  how much to take how to take this when to take this          Major procedures and Radiology Reports - PLEASE review detailed and final reports thoroughly  -       EEG adult  Result Date: 06/03/2023 Charlsie Quest, MD     06/03/2023 12:43 PM Patient Name: [name] MRN: [id number] Epilepsy Attending: Charlsie Quest Referring Physician/Provider: Orland Mustard, MD Date: 06/03/2023 Duration: 22.38 mins Patient history: 89yo female with right sided facial twitching and slurred speech. EEG to evaluate for seizure. Level of  alertness: Awake AEDs during EEG study: None Technical aspects: This EEG study was done with scalp electrodes positioned according to the 10-20 International system of electrode placement. Electrical activity was reviewed with  band pass filter of 1-70Hz , sensitivity of 7 uV/mm, display speed of 75mm/sec with a 60Hz  notched filter applied as appropriate. EEG data were recorded continuously and digitally stored.  Video monitoring was available and reviewed as appropriate. Description: The posterior dominant rhythm consists of 9-10 Hz activity of moderate voltage (25-35 uV) seen predominantly in posterior head regions, symmetric and reactive to eye opening and eye closing. Generalized sharp transients were noted. Physiologic photic driving was not seen during photic stimulation.  Hyperventilation was not performed.   Of note, study was technically difficult due to significant myogenic artifact. IMPRESSION: This technically difficult study is within normal limits. No seizures or epileptiform discharges were seen throughout the recording. A normal interictal EEG does not exclude the diagnosis of epilepsy. Priyanka Annabelle Harman   CT HEAD WO CONTRAST ( )  Result Date: 06/03/2023 CLINICAL DATA:  Neuro deficit, acute, stroke suspected. EXAM: CT HEAD WITHOUT CONTRAST TECHNIQUE: Contiguous axial images were obtained from the base of the skull through the vertex without intravenous contrast. RADIATION DOSE REDUCTION: This exam was performed according to the departmental dose-optimization program which includes automated exposure control, adjustment of the mA and/or kV according to patient size and/or use of iterative reconstruction technique. COMPARISON:  Head CT 09/06/2020 FINDINGS: Brain: Streak artifacts extend across most of the images, most likely due to patient positioning. There is mild cerebral atrophy, small-vessel disease, and atrophic ventriculomegaly. Cerebellum and brainstem are unremarkable. No asymmetry worrisome for an acute infarct, hemorrhage or mass is seen through the artifacts. There is senescent mineralization in both basal ganglia. There is no midline shift. Basal cisterns are clear. Dural calcifications are scattered along  the falx. Vascular: There are patchy calcifications in the carotid siphons. There are no hyperdense central vessels. Skull: Negative for fractures or focal lesions. Sinuses/Orbits: No acute finding. Negative orbits apart from old right lens replacement. Clear sinuses and mastoids. Other: Midline nasal septum. IMPRESSION: Limited exam due to streak artifacts. No convincing acute intracranial CT abnormality. Chronic changes. Electronically Signed   By: Almira Bar M.D.   On: 06/03/2023 03:50   DG Chest Portable 1 View  Result Date: 06/03/2023 CLINICAL DATA:  Slurred speech EXAM: PORTABLE CHEST 1 VIEW COMPARISON:  05/27/2023 FINDINGS: Cardiac shadow is enlarged but stable. Pacing device is again seen. No sizable effusion is noted. Patchy atelectatic changes are noted in the left mid lung. No bony abnormality is seen. IMPRESSION: Patchy left basilar atelectasis Electronically Signed   By: Alcide Clever M.D.   On: 06/03/2023 03:16    Micro Results    No results found for this or any previous visit (from the past 240 hour(s)).  Today   Subjective    [name] today has no headache,no chest abdominal pain,no new weakness tingling or numbness, feels much better wants to go home today.    Objective   Blood pressure (!) 148/74, pulse 60, temperature 98 F (36.7 C), temperature source Oral, resp. rate 16, height 5\' 5"  (1.651 m), weight 90.8 kg, SpO2 99%.   Intake/Output Summary (Last 24 hours) at 06/05/2023 1043 Last data filed at 06/05/2023 1004 Gross per 24 hour  Intake 240.7 ml  Output --  Net 240.7 ml    Exam  Awake Alert, No new F.N deficits,    [place].AT,PERRAL Supple Neck,   Symmetrical  Chest wall movement, Good air movement bilaterally, CTAB RRR,No Gallops,   +ve B.Sounds, Abd Soft, Non tender,  No Cyanosis, Clubbing or edema    Data Review   Recent Labs  Lab 06/03/23 0312 06/03/23 0321 06/04/23 0146 06/05/23 0242  WBC 5.5  --  3.6* 3.4*  HGB 11.3* 11.2* 10.1* 10.3*  HCT  35.7* 33.0* 31.4* 32.4*  PLT 112*  --  102* 102*  MCV 95.2  --  94.0 93.6  MCH 30.1  --  30.2 29.8  MCHC 31.7  --  32.2 31.8  RDW 13.8  --  13.8 13.9  LYMPHSABS 0.6*  --   --  1.0  MONOABS 0.6  --   --  0.4  EOSABS 0.1  --   --  0.1  BASOSABS 0.0  --   --  0.0    Recent Labs  Lab 06/03/23 0312 06/03/23 0321 06/03/23 1054 06/04/23 0146 06/05/23 0242  NA 136 137  --  138 139  K 4.2 4.0  --  4.5 4.7  CL 99  --   --  107 108  CO2 20*  --   --  22 22  ANIONGAP 17*  --   --  9 9  GLUCOSE 246*  --   --  214* 209*  BUN 57*  --   --  43* 27*  CREATININE 2.77*  --   --  1.87* 1.28*  AST 23  --   --   --   --   ALT 19  --   --   --   --   ALKPHOS 84  --   --   --   --   BILITOT 0.4  --   --   --   --   ALBUMIN 3.4*  --   --   --   --   INR 1.1  --   --   --   --   TSH  --   --  0.595  --   --   BNP  --   --   --   --  377.1*  MG  --   --  2.2  --  2.2  CALCIUM 8.4*  --   --  7.9* 8.4*    Total Time in preparing paper work, data evaluation and todays exam - 35 minutes  Signature  -    Susa Raring M.D on 06/05/2023 at 10:43 AM   -  To page go to [email or web address]

## 2023-06-05 NOTE — Discharge Instructions (Signed)
Follow with Primary MD Pcp, No in 7 days   Get CBC, CMP, 2 view Chest X ray -  checked next visit with your primary MD or SNF MD   Activity: As tolerated with Full fall precautions use walker/cane & assistance as needed  Disposition SNF  Diet: Carb Modified dysphagia 3 diet with feeding assistance and aspiration precautions.  Check CBGs q. [facility] S  Special Instructions: If you have smoked or chewed Tobacco  in the last 2 yrs please stop smoking, stop any regular Alcohol  and or any Recreational drug use.  On your next visit with your primary care physician please Get Medicines reviewed and adjusted.  Please request your Prim.MD to go over all Hospital Tests and Procedure/Radiological results at the follow up, please get all Hospital records sent to your Prim MD by signing hospital release before you go home.  If you experience worsening of your admission symptoms, develop shortness of breath, life threatening emergency, suicidal or homicidal thoughts you must seek medical attention immediately by calling 911 or calling your MD immediately  if symptoms less severe.  You Must read complete instructions/literature along with all the possible adverse reactions/side effects for all the Medicines you take and that have been prescribed to you. Take any new Medicines after you have completely understood and accpet all the possible adverse reactions/side effects.

## 2023-06-05 NOTE — NC FL2 (Signed)
[place] MEDICAID FL2 LEVEL OF CARE FORM     IDENTIFICATION  Patient Name: [name] Birthdate: 19-Sep-1933 Sex: female Admission Date (Current Location): 06/03/2023  [facility] and [place] Number:  Producer, television/film/video and Address:  [facility] [place]. [facility], [address], [place], [place] [address]      Provider Number: [id number]  Attending Physician Name and Address:  Leroy Sea, MD  Relative Name and Phone Number:       Current Level of Care: Hospital Recommended Level of Care: Skilled Nursing Facility Prior Approval Number:    Date Approved/Denied:   PASRR Number: [id number]  Discharge Plan: SNF    Current Diagnoses: Patient Active Problem List   Diagnosis Date Noted   Slurred speech and right facial twitching 06/03/2023   Acute renal failure superimposed on stage 3b chronic kidney disease (HCC) 06/03/2023   Paroxysmal atrial fibrillation (HCC) 06/03/2023   Combined congestive systolic and diastolic heart failure (HCC) 06/03/2023   Pressure injury of skin 02/10/2022   Melena 02/08/2022   Diabetic neuropathy (HCC) 12/10/2021   Diabetic nephropathy (HCC) 12/10/2021   Hyperglycemia due to type 2 diabetes mellitus (HCC) 12/10/2021   Pure hypercholesterolemia 12/10/2021   Osteoarthritis of knee 12/10/2021   Complete heart block (HCC) 05/14/2021   Acute on chronic combined systolic and diastolic CHF (congestive heart failure) (HCC) 10/08/2020   GERD (gastroesophageal reflux disease) 10/08/2020   Pacemaker 05/06/2020   Syncope 08/10/2019   Displaced fracture of lateral malleolus of right fibula, initial encounter for closed fracture 07/10/2019   Pain in left shoulder 07/10/2019   Personal history of breast cancer 04/22/2019   Malignant neoplasm of lower-inner quadrant of right breast of female, estrogen receptor positive (HCC) 11/23/2018   Hypertensive heart disease with heart failure (HCC) 09/05/2017   Aphasia 08/31/2017   CKD (chronic kidney  disease), stage III (HCC)    LBBB (left bundle branch block)    Other pancytopenia (HCC)    HTN (hypertension)    Obesity    hx of DVT    DM (diabetes mellitus) (HCC) 07/28/2015   Fall 07/28/2015   Warfarin-induced coagulopathy (HCC) 07/28/2015   Acute on chronic diastolic CHF (congestive heart failure) (HCC) 03/19/2014   SVT (supraventricular tachycardia) (HCC) 10/15/2012   First degree AV block 10/15/2012    Orientation RESPIRATION BLADDER Height & Weight     Self, Time, Situation, Place  Normal Continent Weight: 200 lb 2.8 oz (90.8 kg) Height:  5\' 5"  (165.1 cm)  BEHAVIORAL SYMPTOMS/MOOD NEUROLOGICAL BOWEL NUTRITION STATUS      Incontinent Diet (See dc summary)  AMBULATORY STATUS COMMUNICATION OF NEEDS Skin   Limited Assist Verbally Normal                       Personal Care Assistance Level of Assistance  Bathing, Feeding, Dressing Bathing Assistance: Limited assistance Feeding assistance: Limited assistance Dressing Assistance: Limited assistance     Functional Limitations Info             SPECIAL CARE FACTORS FREQUENCY                       Contractures Contractures Info: Not present    Additional Factors Info  Code Status, Allergies, Psychotropic Code Status Info: DNR Allergies Info: Aspirin, Penicillins, Remeron (Mirtazapine) Psychotropic Info: Lexapro         Current Medications (06/05/2023):  This is the current hospital active medication list Current Facility-Administered Medications  Medication Dose Route Frequency Provider Last Rate Last Admin   acetaminophen (TYLENOL) tablet 650 mg  650 mg Oral Q6H PRN Orland Mustard, MD   650 mg at 06/03/23 2103   Or   acetaminophen (TYLENOL) suppository 650 mg  650 mg Rectal Q6H PRN Orland Mustard, MD       amiodarone (PACERONE) tablet 200 mg  200 mg Oral Daily Orland Mustard, MD   200 mg at 06/05/23 0919   amLODipine (NORVASC) tablet 5 mg  5 mg Oral Daily Leroy Sea, MD   5 mg at 06/05/23  0920   apixaban (ELIQUIS) tablet 2.5 mg  2.5 mg Oral BID Orland Mustard, MD   2.5 mg at 06/05/23 0919   calcium carbonate (TUMS - dosed in mg elemental calcium) chewable tablet 200 mg of elemental calcium  1 tablet Oral BID PRN Howerter, Justin B, DO   200 mg of elemental calcium at 06/04/23 2232   carvedilol (COREG) tablet 6.25 mg  6.25 mg Oral BID Orland Mustard, MD   6.25 mg at 06/05/23 0919   escitalopram (LEXAPRO) tablet 5 mg  5 mg Oral Daily Orland Mustard, MD   5 mg at 06/05/23 0920   insulin aspart (novoLOG) injection 0-9 Units  0-9 Units Subcutaneous TID [facility] Orland Mustard, MD   2 Units at 06/05/23 0920   insulin glargine-yfgn (SEMGLEE) injection 25 Units  25 Units Subcutaneous QHS Leroy Sea, MD   25 Units at 06/04/23 2232   ondansetron (ZOFRAN) tablet 4 mg  4 mg Oral Q6H PRN Orland Mustard, MD       Or   ondansetron [facility]) injection 4 mg  4 mg Intravenous Q6H PRN Orland Mustard, MD       oxybutynin (DITROPAN) tablet 5 mg  5 mg Oral Daily Orland Mustard, MD   5 mg at 06/05/23 4098   pantoprazole (PROTONIX) EC tablet 40 mg  40 mg Oral q morning Orland Mustard, MD   40 mg at 06/05/23 0919   potassium chloride SA (KLOR-CON M) CR tablet 20 mEq  20 mEq Oral Daily Orland Mustard, MD   20 mEq at 06/05/23 0919   pregabalin (LYRICA) capsule 50 mg  50 mg Oral q morning Orland Mustard, MD   50 mg at 06/05/23 0919   pregabalin (LYRICA) capsule 75 mg  75 mg Oral QHS Orland Mustard, MD   75 mg at 06/04/23 2232   senna (SENOKOT) tablet 17.2 mg  2 tablet Oral QHS Orland Mustard, MD   17.2 mg at 06/04/23 2231     Discharge Medications: Please see discharge summary for a list of discharge medications.  Relevant Imaging Results:  Relevant Lab Results:   Additional Information SSN-[id number].  Mearl Latin, LCSW

## 2023-06-05 NOTE — Evaluation (Signed)
Clinical/Bedside Swallow Evaluation Patient Details  Name: [name] MRN: [id number] Date of Birth: 10/06/1933  Today's Date: 06/05/2023 Time: SLP Start Time (ACUTE ONLY): 0347 SLP Stop Time (ACUTE ONLY): 0845 SLP Time Calculation (min) (ACUTE ONLY): 11 min  Past Medical History:  Past Medical History:  Diagnosis Date   Abnormal echocardiogram    a. possible mass on echo 05/2019 on PPM, b. 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 Previously diastolic but patient AVS from outside hospital listed systolic CHF and cardiomyopathy, records pending.   CKD (chronic kidney disease), stage III (HCC)    Clotting disorder (HCC)    CVA (cerebral vascular accident) (HCC)    residual right sided weakness and mild dysphagia   Diabetes mellitus (HCC)    Dizziness and giddiness    DVT (deep venous thrombosis) (HCC)    a. on anticoagulation for this.   Essential hypertension    LBBB (left bundle branch block)    Mitral regurgitation    a. Mod MR by echo 2014.   Muscular deconditioning    Obesity    Pacemaker    Pericardial effusion    SVT (supraventricular tachycardia)    a. In 2013 she had an EPS with ablation for SVT which did not eliminate the SVT completely. She also had bradycardia which limited medication. She was placed on amiodarone by Dr. Ladona Ridgel.   Thrombocytopenia (HCC) 11/21/2011   Past Surgical History:  Past Surgical History:  Procedure Laterality Date   BREAST BIOPSY Right 11/19/2018   positive   ESOPHAGOGASTRODUODENOSCOPY (EGD) WITH PROPOFOL N/A 02/10/2022   Procedure: ESOPHAGOGASTRODUODENOSCOPY (EGD) WITH PROPOFOL;  Surgeon: Jeani Hawking, MD;  Location: [facility] ENDOSCOPY;  Service: Gastroenterology;  Laterality: N/A;   EYE SURGERY     SUPRAVENTRICULAR  TACHYCARDIA ABLATION N/A 10/11/2012   Procedure: SUPRAVENTRICULAR TACHYCARDIA ABLATION;  Surgeon: Marinus Maw, MD;  Location: [facility] CATH LAB;  Service: Cardiovascular;  Laterality: N/A;   HPI:  Pt is an 89 y.o. female who presented with concerns for right sided facial twitching and slurred speech. CT head 8/10 negative for acute changes. PMH: systolic CHF, CKD stage III, hx of CVA, T2DM, hx of breast cancer, PAF on eliquis, CHB s/p PPM. primary esopahgeal dysphagia with dilatation. Dysmotility seen on 2020 esophagram. Prior BSE in 2021 recommended esopahgeal precautions, soft diet and esophageal interventions as appropriate. MBS 02/09/22:  primary esophageal dysphagia. Oral phase impaired as evidenced by impaired mastication of soft-solid nutri grain bar possibly due to missing bottom denture plate. Pharyngeal phase of swallowing initiated at level of the valleculae, which is WNL. Observed adequate hyoid elevation and anterior movement. Esophageal stasis noted which was improved with a liquid wash. A dysphagia 3 diet with thin liquids was recommended at that time.    Assessment / Plan / Recommendation  Clinical Impression  Pt demonstrates mild esophageal impairments with a known history of esophageal dysmotility and  states "sometimes it comes up and I feel a pain in my chest." She did have eructation during assessment but denied globus sensation. There are no concerns with her pharyngeal swallow or s/s aspiration. She has upper dentures but no lower and stated she was unable to masticate the pot roast yesterday and prefers  to have her meats chopped. Able to masticate the graham cracker without difficulty. She was educated on esophageal precautions to stay upright after meals and alternate liquids and solids. Pt in agreement with recommendations for Dys 3 texture, thin liquids. She takes meds with liquids unless Potassium which she prefers whole in applesauce to help dissolve. No further ST is needed at this  time. SLP Visit Diagnosis: Dysphagia, unspecified (R13.10)    Aspiration Risk  Mild aspiration risk    Diet Recommendation Dysphagia 3 (Mech soft);Thin liquid    Liquid Administration via: Cup;Straw Medication Administration: Whole meds with liquid (prefers potassium in applesauce) Supervision: Patient able to self feed Postural Changes: Seated upright at 90 degrees;Remain upright for at least 30 minutes after po intake    Other  Recommendations Oral Care Recommendations: Oral care BID    Recommendations for follow up therapy are one component of a multi-disciplinary discharge planning process, led by the attending physician.  Recommendations may be updated based on patient status, additional functional criteria and insurance authorization.  Follow up Recommendations No SLP follow up      Assistance Recommended at Discharge    Functional Status Assessment Patient has not had a recent decline in their functional status  Frequency and Duration            Prognosis        Swallow Study   General HPI: Pt is an 89 y.o. female who presented with concerns for right sided facial twitching and slurred speech. CT head 8/10 negative for acute changes. PMH: systolic CHF, CKD stage III, hx of CVA, T2DM, hx of breast cancer, PAF on eliquis, CHB s/p PPM. primary esopahgeal dysphagia with dilatation. Dysmotility seen on 2020 esophagram. Prior BSE in 2021 recommended esopahgeal precautions, soft diet and esophageal interventions as appropriate. MBS 02/09/22:  primary esophageal dysphagia. Oral phase impaired as evidenced by impaired mastication of soft-solid nutri grain bar possibly due to missing bottom denture plate. Pharyngeal phase of swallowing initiated at level of the valleculae, which is WNL. Observed adequate hyoid elevation and anterior movement. Esophageal stasis noted which was improved with a liquid wash. A dysphagia 3 diet with thin liquids was recommended at that time. Type of Study:  Bedside Swallow Evaluation Previous Swallow Assessment:  (see HPI) Diet Prior to this Study: Regular;Thin liquids (Level 0) Temperature Spikes Noted: No Respiratory Status: Room air History of Recent Intubation: No Behavior/Cognition: Alert;Cooperative;Pleasant mood Oral Cavity Assessment: Within Functional Limits Oral Care Completed by SLP: No Oral Cavity - Dentition: Dentures, top (no  lower dentition) Vision: Functional for self-feeding Self-Feeding Abilities: Able to feed self Patient Positioning: Upright in bed Baseline Vocal Quality: Normal Volitional Cough: Strong Volitional Swallow: Able to elicit    Oral/Motor/Sensory Function Overall Oral Motor/Sensory Function: Within functional limits   Ice Chips Ice chips: Not tested   Thin Liquid Thin Liquid: Within functional limits Presentation: Straw    Nectar Thick Nectar Thick Liquid: Not tested   Honey Thick Honey Thick Liquid: Not tested   Puree Puree: Within functional limits   Solid     Solid: Within functional limits      Royce Macadamia 06/05/2023,8:57 AM

## 2023-06-05 NOTE — Plan of Care (Signed)

## 2023-06-05 NOTE — TOC Initial Note (Signed)
Transition of Care [facility]) - Initial/Assessment Note    Patient Details  Name: [name] MRN: [id number] Date of Birth: [birth year]  Transition [facility]) CM/SW Contact:    Mearl Latin, LCSW Phone Number: 06/05/2023, 11:46 AM  Clinical Narrative:                 CSW met with patient and she reported agreement to returning to [facility] today. CSW provided verbal permission for CSW to notify her daughter. She requested [facility] for transport. CSW spoke with patient's daughter, Cherly Hensen, who had questions about getting patient a CPAP. CSW reviewed chart and patient had a sleep study done in 2020 and was supposed to receive one but daughter stated she never did. She reported that she is going to contact the Pulmonology office to set up an appointment.   Expected Discharge Plan: Skilled Nursing Facility Barriers to Discharge: No Barriers Identified   Patient Goals and CMS Choice Patient states their goals for this hospitalization and ongoing recovery are:: Return to SNF CMS Medicare.gov Compare Post Acute Care list provided to:: Patient Choice offered to / list presented to : Patient [place] ownership interest in [facility].provided to:: Patient    Expected Discharge Plan and Services In-house Referral: Clinical Social Work   Post Acute Care Choice: Skilled Nursing Facility Living arrangements for the past 2 months: Skilled Nursing Facility Expected Discharge Date: 06/05/23                                    Prior Living Arrangements/Services Living arrangements for the past 2 months: Skilled Nursing Facility Lives with:: Facility Resident Patient language and need for interpreter reviewed:: Yes Do you feel safe going back to the place where you live?: Yes      Need for Family Participation in Patient Care: No (Comment) Care giver support system in place?: Yes (comment)   Criminal Activity/Legal Involvement Pertinent to Current  Situation/Hospitalization: No - Comment as needed  Activities of Daily Living      Permission Sought/Granted Permission sought to share information with : Facility Medical sales representative, Family Supports Permission granted to share information with : Yes, Verbal Permission Granted  Share Information with NAME: Cherly Hensen  Permission granted to share info w AGENCY: [facility]  Permission granted to share info w Relationship: Daughter  Permission granted to share info w Contact Information: [phone]  Emotional Assessment Appearance:: Appears stated age Attitude/Demeanor/Rapport: Engaged Affect (typically observed): Accepting, Appropriate, Pleasant Orientation: : Oriented to Self, Oriented to Place, Oriented to  Time, Oriented to Situation Alcohol / Substance Use: Not Applicable Psych Involvement: No (comment)  Admission diagnosis:  Slurred speech [R47.81] AKI (acute kidney injury) (HCC) [N17.9] Patient Active Problem List   Diagnosis Date Noted   Slurred speech and right facial twitching 06/03/2023   Acute renal failure superimposed on stage 3b chronic kidney disease (HCC) 06/03/2023   Paroxysmal atrial fibrillation (HCC) 06/03/2023   Combined congestive systolic and diastolic heart failure (HCC) 06/03/2023   Pressure injury of skin 02/10/2022   Melena 02/08/2022   Diabetic neuropathy (HCC) 12/10/2021   Diabetic nephropathy (HCC) 12/10/2021   Hyperglycemia due to type 2 diabetes mellitus (HCC) 12/10/2021   Pure hypercholesterolemia 12/10/2021   Osteoarthritis of knee 12/10/2021   Complete heart block (HCC) 05/14/2021   Acute on chronic combined systolic and diastolic CHF (congestive heart failure) (HCC) 10/08/2020   GERD (gastroesophageal reflux  disease) 10/08/2020   Pacemaker 05/06/2020   Syncope 08/10/2019   Displaced fracture of lateral malleolus of right fibula, initial encounter for closed fracture 07/10/2019   Pain in left shoulder 07/10/2019   Personal history  of breast cancer 04/22/2019   Malignant neoplasm of lower-inner quadrant of right breast of female, estrogen receptor positive (HCC) 11/23/2018   Hypertensive heart disease with heart failure (HCC) 09/05/2017   Aphasia 08/31/2017   CKD (chronic kidney disease), stage III (HCC)    LBBB (left bundle branch block)    Other pancytopenia (HCC)    HTN (hypertension)    Obesity    hx of DVT    DM (diabetes mellitus) (HCC) 07/28/2015   Fall 07/28/2015   Warfarin-induced coagulopathy (HCC) 07/28/2015   Acute on chronic diastolic CHF (congestive heart failure) (HCC) 03/19/2014   SVT (supraventricular tachycardia) (HCC) 10/15/2012   First degree AV block 10/15/2012   PCP:  Pcp, No Pharmacy:   [id number] - [place], [place] - [address] AT [facility] [address] [place] [address] Phone: [phone] Fax: [phone]     Social Determinants of Health (SDOH) Social History: SDOH Screenings   Food Insecurity: Low Risk  (05/27/2023)   Received from [facility]  Utilities: Low Risk  (05/27/2023)   Received from [facility]  Depression (PHQ2-9): Medium Risk (04/02/2020)  Financial Resource Strain: Medium Risk (08/01/2020)  Physical Activity: Inactive (04/02/2020)  Stress: No Stress Concern Present (04/02/2020)  Tobacco Use: Low Risk  (06/03/2023)   SDOH Interventions:     Readmission Risk Interventions     No data to display

## 2023-06-05 NOTE — TOC Transition Note (Signed)
Transition of Care [facility]) - CM/SW Discharge Note   Patient Details  Name: [name] MRN: [id number] Date of Birth: June 12, 1933  Transition of Care [facility]) CM/SW Contact:  Mearl Latin, LCSW Phone Number: 06/05/2023, 2:12 PM   Clinical Narrative:    Patient will DC to: [facility] Anticipated DC date: 06/05/23 Family notified: Daughter, Architectural technologist by: Sharin Mons   Per MD patient ready for DC to [facility]. RN to call report prior to discharge [phone] room 701b [facility] community). RN, patient, patient's family, and facility notified of DC. Discharge Summary and FL2 sent to facility. DC packet on chart including signed DNR. Ambulance transport requested for patient.   CSW will sign off for now as social work intervention is no longer needed. Please consult [place] again if new needs arise.     Final next level of care: Skilled Nursing Facility Barriers to Discharge: No Barriers Identified   Patient Goals and CMS Choice CMS Medicare.gov Compare Post Acute Care list provided to:: Patient Choice offered to / list presented to : Patient  Discharge Placement     Existing PASRR number confirmed : 06/05/23          Patient chooses bed at: [facility] Patient to be transferred to facility by: [facility] Name of family member notified: Daughter Patient and family notified of of transfer: 06/05/23  Discharge Plan and Services Additional resources added to the After Visit Summary for   In-house Referral: Clinical Social Work   Post Acute Care Choice: Skilled Nursing Facility                               Social Determinants of Health (SDOH) Interventions SDOH Screenings   Food Insecurity: Low Risk  (05/27/2023)   Received from [facility]  Utilities: Low Risk  (05/27/2023)   Received from [facility]  Depression (PHQ2-9): Medium Risk (04/02/2020)  Financial Resource Strain: Medium Risk (08/01/2020)  Physical Activity: Inactive (04/02/2020)  Stress: No Stress  Concern Present (04/02/2020)  Tobacco Use: Low Risk  (06/03/2023)     Readmission Risk Interventions     No data to display

## 2023-07-06 ENCOUNTER — Encounter: Payer: Self-pay | Admitting: Adult Health

## 2023-07-06 ENCOUNTER — Ambulatory Visit (INDEPENDENT_AMBULATORY_CARE_PROVIDER_SITE_OTHER): Payer: Medicare Other | Admitting: Adult Health

## 2023-07-06 VITALS — BP 128/72 | HR 60 | Ht 65.0 in | Wt 198.4 lb

## 2023-07-06 DIAGNOSIS — G4733 Obstructive sleep apnea (adult) (pediatric): Secondary | ICD-10-CM | POA: Diagnosis not present

## 2023-07-06 DIAGNOSIS — I5042 Chronic combined systolic (congestive) and diastolic (congestive) heart failure: Secondary | ICD-10-CM

## 2023-07-06 NOTE — Progress Notes (Signed)
@Patient  ID: [name], female    DOB: 04-12-1933, 89 y.o.   MRN: [id number]  Chief Complaint  Patient presents with   Consult    Referring provider: No ref. provider found  HPI: 89 year old female seen for sleep consult July 06, 2023 to evaluate for sleep apnea Medical history significant for combined systolic and diastolic heart failure, chronic kidney disease, A-fib, history of DVT, breast cancer  Nursing home patient  TEST/EVENTS :  Split night study 08/02/19 showed severe OSA , AHI 46.8/h, SpO2 Low 80%, Optimal control on BIPAP 17/13 cm .   07/06/2023 Sleep consult  Patient presents for sleep consult today to establish for sleep apnea.  Patient says she has very loud snoring, witnessed apneic events, gasping for air, significant daytime sleepiness.  She says the symptoms have been going on for a long time.  She was set up for a sleep study that was done in October 2020.  Split-night study showed very severe sleep apnea with AHI of 46.8/hour and an SpO2 low at 80%.  Optimal control was on BiPAP IPAP max 17 and EPAP minimum 13 cm H2O.  Unfortunately patient says that she was never started on BiPAP therapy.  She does have multiple medical problems including congestive heart failure, chronic kidney disease and A-fib.  She is currently a resident of a skilled nursing facility.  She says she has been having ongoing issues with her daytime sleepiness and loud snoring.   Typically goes to bed about 10:00 PM.  Can take a long time to go to sleep.  Is up multiple times throughout the night.  Gets up at 7 AM.  Her weight is up a few pounds current weight is at 198 pounds with a BMI of 33.  She has no symptoms suspicious for cataplexy or sleep paralysis. Epworth score is 16 out of 24.  Typically gets sleepy if she sits down to rest or watch TV and in the evening hours.   Allergies  Allergen Reactions   Aspirin Itching   Penicillins Itching and Rash    DID THE REACTION INVOLVE: Swelling  of the face/tongue/throat, SOB, or low BP? Yes Sudden or severe rash/hives, skin peeling, or the inside of the mouth or nose? No Did it require medical treatment? Yes When did it last happen? "More than 10 years ago" If all above answers are "NO", may proceed with cephalosporin use.  Tolerated full course of Unasyn in 2021.   Remeron [Mirtazapine] Other (See Comments)    Unknown reaction    Immunization History  Administered Date(s) Administered   DTaP 12/13/2012   Fluad Quad(high Dose 65+) 07/16/2020   Influenza, High Dose Seasonal PF 08/15/2018   Influenza, Quadrivalent, Recombinant, Inj, Pf 09/20/2017   Pneumococcal-Unspecified 04/04/2016    Past Medical History:  Diagnosis Date   Abnormal echocardiogram    a. possible mass on echo 05/2019 on PPM, b. 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 Previously diastolic but patient AVS from outside hospital listed systolic CHF and cardiomyopathy, records pending.   CKD (chronic kidney disease), stage III (HCC)    Clotting disorder (HCC)    CVA (cerebral vascular accident) (HCC)    residual right sided weakness  and mild dysphagia   Diabetes mellitus (HCC)    Dizziness and giddiness    DVT (deep venous thrombosis) (HCC)    a. on anticoagulation for this.   Essential hypertension    LBBB (left bundle branch block)    Mitral regurgitation    a. Mod MR by echo 2014.   Muscular deconditioning    Obesity    Pacemaker    Pericardial effusion    SVT (supraventricular tachycardia)    a. In 2013 she had an EPS with ablation for SVT which did not eliminate the SVT completely. She also had bradycardia which limited medication. She was placed on amiodarone by Dr. Ladona Ridgel.   Thrombocytopenia (HCC) 11/21/2011    Tobacco  History: Social History   Tobacco Use  Smoking Status Never  Smokeless Tobacco Never   Counseling given: Not Answered   Outpatient Medications Prior to Visit  Medication Sig Dispense Refill   acetaminophen (TYLENOL) 650 MG CR tablet Take 650 mg by mouth every 8 (eight) hours.     amiodarone (PACERONE) 200 MG tablet Take one tablet by mouth daily Monday-Friday. Do NOT take Saturday and Sunday 90 tablet 3   amLODipine (NORVASC) 5 MG tablet Take 5 mg by mouth daily.     carvedilol (COREG) 6.25 MG tablet Take 6.25 mg by mouth 2 (two) times daily.     Docusate Sodium 100 MG capsule Take 100 mg by mouth every morning.     Dulaglutide 3 MG/0.5ML SOPN Inject 1.5 mg into the skin every 7 (seven) days. tuesday     ELIQUIS 2.5 MG TABS tablet TAKE 1 TABLET(2.5 MG) BY MOUTH TWICE DAILY (Patient taking differently: Take 2.5 mg by mouth 2 (two) times daily.) 60 tablet 2   escitalopram (LEXAPRO) 5 MG tablet Take 5 mg by mouth daily.     guaifenesin (HUMIBID E) 400 MG TABS tablet Take 400 mg by mouth. Take 1 1/2 tablets by mouth at bedtime     insulin glargine (LANTUS) 100 unit/mL SOPN Inject 23 Units into the skin at bedtime.     insulin lispro (HUMALOG KWIKPEN) 100 UNIT/ML KwikPen Before each meal 3 times a day, 140-199 - 2 units, 200-250 - 4 units, 251-299 - 6 units,  300-349 - 8 units,  350 or above 10 units.     loratadine (CLARITIN) 10 MG tablet Take 10 mg by mouth daily.     losartan (COZAAR) 25 MG tablet Take 1 tablet (25 mg total) by mouth daily.     omeprazole (PRILOSEC OTC) 20 MG tablet Take 20 mg by mouth every morning.     oxybutynin (DITROPAN) 5 MG tablet TAKE 1 TABLET(5 MG) BY MOUTH TWICE DAILY (Patient taking differently: Take 5 mg by mouth daily.) 180 tablet 1   Potassium Chloride ER 20 MEQ TBCR Take 20 mEq by mouth daily.     pregabalin (LYRICA) 50 MG capsule Take 50 mg by mouth every morning.     pregabalin (LYRICA) 75 MG capsule Take 1 capsule (75 mg total) by mouth at bedtime. 90  capsule 1   senna (SENOKOT) 8.6 MG tablet Take 2 tablets by mouth at bedtime.     torsemide (DEMADEX) 20 MG tablet Take 40 mg every morning.  Take 20-40 mg daily every afternoon as needed for swelling or shortness of breath (Patient taking differently: Take 40 mg by mouth daily. Take 40 mg every morning.  Take 20-40 mg daily every afternoon as needed for swelling or shortness of breath) 360 tablet  3   No facility-administered medications prior to visit.     Review of Systems:   Constitutional:   No  weight loss, night sweats,  Fevers, chills,  +fatigue, or  lassitude.  HEENT:   No headaches,  Difficulty swallowing,  Tooth/dental problems, or  Sore throat,                No sneezing, itching, ear ache, nasal congestion, post nasal drip,   CV:  No chest pain,  Orthopnea, PND,anasarca, dizziness, palpitations, syncope.   GI  No heartburn, indigestion, abdominal pain, nausea, vomiting, diarrhea, change in bowel habits, loss of appetite, bloody stools.   Resp: No shortness of breath with exertion or at rest.  No excess mucus, no productive cough,  No non-productive cough,  No coughing up of blood.  No change in color of mucus.  No wheezing.  No chest wall deformity  Skin: no rash or lesions.  GU: no dysuria, change in color of urine, no urgency or frequency.  No flank pain, no hematuria   MS:  No joint pain or swelling.  No decreased range of motion.  No back pain.    Physical Exam  BP 128/72   Pulse 60   Ht 5\' 5"  (1.651 m)   Wt 198 lb 6.4 oz (90 kg)   SpO2 99%   BMI 33.02 kg/m   GEN: A/Ox3; pleasant , NAD, elderly in wc    HEENT:  [place]/AT,  NOSE-clear, THROAT-clear, no lesions, no postnasal drip or exudate noted. Class 3-4 MP airway   NECK:  Supple w/ fair ROM; no JVD; normal carotid impulses w/o bruits; no thyromegaly or nodules palpated; no lymphadenopathy.    RESP  Clear  P & A; w/o, wheezes/ rales/ or rhonchi. no accessory muscle use, no dullness to percussion  CARD:   RRR, no m/r/g, 1+ peripheral edema, pulses intact, no cyanosis or clubbing.  GI:   Soft & nt; nml bowel sounds; no organomegaly or masses detected.   Musco: Warm bil, no deformities or joint swelling noted.   Neuro: alert, no focal deficits noted.    Skin: Warm, no lesions or rashes    Lab Results:  CBC   BMET     Imaging: No results found.  Administration History     None          Latest Ref Rng & Units 07/25/2019    4:00 PM  PFT Results  FVC-Pre L 2.11   FVC-Predicted Pre % 140   FVC-Post L 2.13   FVC-Predicted Post % 141   Pre FEV1/FVC % % 82   Post FEV1/FCV % % 86   FEV1-Pre L 1.72   FEV1-Predicted Pre % 149   FEV1-Post L 1.83   DLCO uncorrected ml/min/mmHg 15.57   DLCO UNC% % 94   DLCO corrected ml/min/mmHg 17.75   DLCO COR %Predicted % 107   DLVA Predicted % 106   TLC L 4.92   TLC % Predicted % 108   RV % Predicted % 109     No results found for: "NITRICOXIDE"      Assessment & Plan:   No problem-specific Assessment & Plan notes found for this encounter.     Rubye Oaks, NP 07/06/2023

## 2023-07-06 NOTE — Assessment & Plan Note (Signed)
Appears compensated.  Euvolemic on exam.  Continue follow-up with cardiology

## 2023-07-06 NOTE — Patient Instructions (Signed)
Set up for BIPAP titration study.  Healthy sleep regimen  Do not drive if sleepy  Work on healthy weight  Follow up with Dr. Wynona Neat or Chasen Mendell NP in 3 months and As needed

## 2023-07-06 NOTE — Assessment & Plan Note (Signed)
Severe obstructive sleep apnea.  Patient has multiple underlying comorbidities including congestive heart failure, A-fib and chronic kidney disease.  Patient would benefit from BiPAP therapy.  Her sleep study was in 2020.  Recommend a BiPAP titration study.  Once results are available would proceed with BiPAP therapy.  If not covered by insurance we will need to repeat a split-night in order to get therapy  - discussed how weight can impact sleep and risk for sleep disordered breathing - discussed options to assist with weight loss: combination of diet modification, cardiovascular and strength training exercises   - had an extensive discussion regarding the adverse health consequences related to untreated sleep disordered breathing - specifically discussed the risks for hypertension, coronary artery disease, cardiac dysrhythmias, cerebrovascular disease, and diabetes - lifestyle modification discussed   - discussed how sleep disruption can increase risk of accidents, particularly when driving - safe driving practices were discussed   Plan  Patient Instructions  Set up for BIPAP titration study.  Healthy sleep regimen  Do not drive if sleepy  Work on healthy weight  Follow up with Dr. Wynona Neat or Berlie Persky NP in 3 months and As needed

## 2023-08-03 ENCOUNTER — Ambulatory Visit: Payer: Medicare Other

## 2023-08-03 DIAGNOSIS — I440 Atrioventricular block, first degree: Secondary | ICD-10-CM

## 2023-08-03 LAB — CUP PACEART REMOTE DEVICE CHECK
Battery Voltage: 65
Date Time Interrogation Session: 20241010071614
Implantable Lead Connection Status: 753985
Implantable Lead Connection Status: 753985
Implantable Lead Implant Date: 20200624
Implantable Lead Implant Date: 20200624
Implantable Lead Location: 753859
Implantable Lead Location: 753860
Implantable Lead Model: 377
Implantable Lead Model: 377
Implantable Lead Serial Number: 81195510
Implantable Pulse Generator Implant Date: 20200624
Pulse Gen Model: 407145
Pulse Gen Serial Number: 69638782

## 2023-08-17 NOTE — Progress Notes (Signed)
Remote pacemaker transmission.   

## 2023-08-24 ENCOUNTER — Telehealth: Payer: Self-pay | Admitting: Adult Health

## 2023-08-24 NOTE — Telephone Encounter (Signed)
[name] calling from [facility] has been trying to get this Bipap Tritraition authorized since Sept 13th. The Patient got the order Sept 12th, according to the insurance [facility])  it was covered 100% by [employer], any updates ?

## 2023-08-30 NOTE — Telephone Encounter (Signed)
This is scheduled for 09/10/23 and patient aware no [name] was needed for either insurance

## 2023-09-10 ENCOUNTER — Ambulatory Visit (HOSPITAL_BASED_OUTPATIENT_CLINIC_OR_DEPARTMENT_OTHER): Payer: Medicare Other | Attending: Adult Health | Admitting: Pulmonary Disease

## 2023-09-10 DIAGNOSIS — G4733 Obstructive sleep apnea (adult) (pediatric): Secondary | ICD-10-CM | POA: Diagnosis present

## 2023-09-12 DIAGNOSIS — G4733 Obstructive sleep apnea (adult) (pediatric): Secondary | ICD-10-CM

## 2023-09-12 NOTE — Procedures (Signed)
Patient Name: [name], Pazmino Date: 09/10/2023 Gender: Female D.O.B: [birth year] Age (years): 78 Referring Provider: Babette Relic Parrett Height (inches): 65 Interpreting Physician: Cyril Mourning MD, ABSM Weight (lbs): 187 RPSGT: Rolene Arbour BMI: 31 MRN: [id number] Neck Size: 15.50 <br> <br> CLINICAL INFORMATION The patient is referred for a BiPAP titration to treat sleep apnea.    Split night study 08/02/19 showed severe OSA , AHI 46.8/h, SpO2 Low 80%, Optimal control on BIPAP 17/13 cm .  SLEEP STUDY TECHNIQUE As per the AASM Manual for the Scoring of Sleep and Associated Events v2.3 (April 2016) with a hypopnea requiring 4% desaturations.  The channels recorded and monitored were frontal, central and occipital EEG, electrooculogram (EOG), submentalis EMG (chin), nasal and oral airflow, thoracic and abdominal wall motion, anterior tibialis EMG, snore microphone, electrocardiogram, and pulse oximetry. Bilevel positive airway pressure (BPAP) was initiated at the beginning of the study and titrated to treat sleep-disordered breathing.  MEDICATIONS Medications self-administered by patient taken the night of the study : FAMOTIDINE, LEVEMIR FLEXTOUCH, LIPITOR, LYRICA  RESPIRATORY PARAMETERS Optimal IPAP Pressure (cm): 16 AHI at Optimal Pressure (/hr) 6.5 Optimal EPAP Pressure (cm): 12   Overall Minimal O2 (%): 86.0 Minimal O2 at Optimal Pressure (%): 93.0 SLEEP ARCHITECTURE Start Time: 10:51:07 PM Stop Time: 4:58:09 AM Total Time (min): 367 Total Sleep Time (min): 252.7 Sleep Latency (min): 22.9 Sleep Efficiency (%): 68.8% REM Latency (min): 305.5 WASO (min): 91.5 Stage N1 (%): 0.8% Stage N2 (%): 88.5% Stage N3 (%): 0.0% Stage R (%): 10.7 Supine (%): 100.00 Arousal Index (/hr): 1.4     CARDIAC DATA The 2 lead EKG demonstrated sinus rhythm. The mean heart rate was 60.1 beats per minute. Other EKG findings include: None.   LEG MOVEMENT DATA The total Periodic Limb Movements of  Sleep (PLMS) were 0. The PLMS index was 0.0. A PLMS index of <15 is considered normal in adults.  IMPRESSIONS - An optimal BiPAP pressure was selected for this patient ( 15 / 11 cm of water). REM was not seen at this level. Centrals emerged on 16/12 cm - Central sleep apnea was not noted during this titration (CAI = 2.6/h). - Moderate oxygen desaturations were observed during this titration (min O2 = 86.0%). - The patient snored with moderate snoring volume. - No cardiac abnormalities were observed during this study. - Clinically significant periodic limb movements were not noted during this study. Arousals associated with PLMs were rare.   DIAGNOSIS - Obstructive Sleep Apnea (G47.33)   RECOMMENDATIONS - Trial of BiPAP therapy on 15/11 cm H2O with a Small size Fisher&Paykel Full Face Simplus mask and heated humidification. - Avoid alcohol, sedatives and other CNS depressants that may worsen sleep apnea and disrupt normal sleep architecture. - Sleep hygiene should be reviewed to assess factors that may improve sleep quality. - Weight management and regular exercise should be initiated or continued. - Return to Sleep Center for re-evaluation after 4 weeks of therapy  [Electronically signed] 09/12/2023 08:07 AM  Cyril Mourning MD, ABSM Diplomate, American Board of Sleep Medicine NPI: [id number]

## 2023-09-13 NOTE — Progress Notes (Unsigned)
  Electrophysiology Office Note:   ID:  Stephanie, [name] [birth year], MRN [id number]  Primary Cardiologist: Lance Muss, MD Electrophysiologist: Lewayne Bunting, MD  {Click to update primary MD,subspecialty MD or APP then REFRESH:1}    History of Present Illness:   [name] is a 89 y.o. female with h/o Permanent AF, HTN, SVT, and CHB s/p PPM seen today for routine electrophysiology followup.   Since last being seen in our clinic the patient reports doing ***.  she denies chest pain, palpitations, dyspnea, PND, orthopnea, nausea, vomiting, dizziness, syncope, edema, weight gain, or early satiety.   Review of systems complete and found to be negative unless listed in HPI.   EP Information / Studies Reviewed:    EKG is not ordered today. EKG from 06/03/2023 reviewed which showed NSR at 60 bpm       PPM Interrogation-  reviewed in detail today,  See PACEART report.  Device History: Biotronik Dual Chamber PPM implanted 03/2019 for CHB   Physical Exam:   VS:  There were no vitals taken for this visit.   Wt Readings from Last 3 Encounters:  09/10/23 199 lb (90.3 kg)  07/06/23 198 lb 6.4 oz (90 kg)  06/05/23 200 lb 2.8 oz (90.8 kg)     GEN: Well nourished, well developed in no acute distress NECK: No JVD; No carotid bruits CARDIAC: {EPRHYTHM:28826}, no murmurs, rubs, gallops RESPIRATORY:  Clear to auscultation without rales, wheezing or rhonchi  ABDOMEN: Soft, non-tender, non-distended EXTREMITIES:  No edema; No deformity   ASSESSMENT AND PLAN:    CHB s/p Biotronik PPM  Normal PPM function See Pace Art report No changes today  HTN Stable on current regimen   Permanent AF SVT Continue amiodarone 200 mg M-F. Surveillance labs today.   {Click here to Review PMH, Prob List, Meds, Allergies, SHx, FHx  :1}   Disposition:   Follow up with {EPPROVIDERS:28135} {EPFOLLOW UP:28173}  Signed, Graciella Freer, PA-C

## 2023-09-14 ENCOUNTER — Ambulatory Visit: Payer: Medicare Other | Attending: Student | Admitting: Student

## 2023-09-14 ENCOUNTER — Encounter: Payer: Self-pay | Admitting: Student

## 2023-09-14 VITALS — BP 138/80 | HR 60 | Ht 65.0 in | Wt 198.8 lb

## 2023-09-14 DIAGNOSIS — I5032 Chronic diastolic (congestive) heart failure: Secondary | ICD-10-CM

## 2023-09-14 DIAGNOSIS — I4821 Permanent atrial fibrillation: Secondary | ICD-10-CM | POA: Diagnosis not present

## 2023-09-14 DIAGNOSIS — I10 Essential (primary) hypertension: Secondary | ICD-10-CM

## 2023-09-14 DIAGNOSIS — I471 Supraventricular tachycardia, unspecified: Secondary | ICD-10-CM | POA: Diagnosis not present

## 2023-09-14 LAB — CUP PACEART INCLINIC DEVICE CHECK
Date Time Interrogation Session: 20241121131058
Implantable Lead Connection Status: 753985
Implantable Lead Connection Status: 753985
Implantable Lead Implant Date: 20200624
Implantable Lead Implant Date: 20200624
Implantable Lead Location: 753859
Implantable Lead Location: 753860
Implantable Lead Model: 377
Implantable Lead Model: 377
Implantable Lead Serial Number: 81195510
Implantable Pulse Generator Implant Date: 20200624
Pulse Gen Model: 407145
Pulse Gen Serial Number: 69638782

## 2023-09-14 NOTE — Patient Instructions (Signed)
Medication Instructions:  Your physician recommends that you continue on your current medications as directed. Please refer to the Current Medication list given to you today.  *If you need a refill on your cardiac medications before your next appointment, please call your pharmacy*  Lab Work: CMET, TSH, FreeT4-TODAY If you have labs (blood work) drawn today and your tests are completely normal, you will receive your results only by: MyChart Message (if you have MyChart) OR A paper copy in the mail If you have any lab test that is abnormal or we need to change your treatment, we will call you to review the results.  Follow-Up: At [facility], you and your health needs are our priority.  As part of our continuing mission to provide you with exceptional heart care, we have created designated Provider Care Teams.  These Care Teams include your primary Cardiologist (physician) and Advanced Practice Providers (APPs -  Physician Assistants and Nurse Practitioners) who all work together to provide you with the care you need, when you need it.  Your next appointment:   6 month(s)  Provider:   Lewayne Bunting, MD   8 months with Dr Anne Fu

## 2023-09-15 ENCOUNTER — Other Ambulatory Visit: Payer: Self-pay

## 2023-09-15 DIAGNOSIS — G4733 Obstructive sleep apnea (adult) (pediatric): Secondary | ICD-10-CM

## 2023-09-15 LAB — COMPREHENSIVE METABOLIC PANEL
ALT: 14 [IU]/L (ref 0–32)
AST: 15 [IU]/L (ref 0–40)
Albumin: 3.9 g/dL (ref 3.7–4.7)
Alkaline Phosphatase: 90 [IU]/L (ref 44–121)
BUN/Creatinine Ratio: 21 (ref 12–28)
BUN: 33 mg/dL — ABNORMAL HIGH (ref 8–27)
Bilirubin Total: 0.3 mg/dL (ref 0.0–1.2)
CO2: 27 mmol/L (ref 20–29)
Calcium: 8.6 mg/dL — ABNORMAL LOW (ref 8.7–10.3)
Chloride: 102 mmol/L (ref 96–106)
Creatinine, Ser: 1.59 mg/dL — ABNORMAL HIGH (ref 0.57–1.00)
Globulin, Total: 2.6 g/dL (ref 1.5–4.5)
Glucose: 176 mg/dL — ABNORMAL HIGH (ref 70–99)
Potassium: 3.8 mmol/L (ref 3.5–5.2)
Sodium: 144 mmol/L (ref 134–144)
Total Protein: 6.5 g/dL (ref 6.0–8.5)
eGFR: 31 mL/min/{1.73_m2} — ABNORMAL LOW (ref >59)

## 2023-09-15 LAB — TSH: TSH: 1.14 u[IU]/mL (ref 0.450–4.500)

## 2023-09-15 LAB — T4, FREE: Free T4: 1.39 ng/dL (ref 0.82–1.77)

## 2023-10-10 ENCOUNTER — Ambulatory Visit: Payer: Medicare Other | Admitting: Interventional Cardiology

## 2023-11-02 ENCOUNTER — Ambulatory Visit (INDEPENDENT_AMBULATORY_CARE_PROVIDER_SITE_OTHER): Payer: Medicare Other

## 2023-11-02 DIAGNOSIS — I440 Atrioventricular block, first degree: Secondary | ICD-10-CM | POA: Diagnosis not present

## 2023-11-02 LAB — CUP PACEART REMOTE DEVICE CHECK
Battery Remaining Percentage: 65 %
Brady Statistic RA Percent Paced: 0 %
Brady Statistic RV Percent Paced: 83 %
Date Time Interrogation Session: 20250109074623
Implantable Lead Connection Status: 753985
Implantable Lead Connection Status: 753985
Implantable Lead Implant Date: 20200624
Implantable Lead Implant Date: 20200624
Implantable Lead Location: 753859
Implantable Lead Location: 753860
Implantable Lead Model: 377
Implantable Lead Model: 377
Implantable Lead Serial Number: 81195510
Implantable Pulse Generator Implant Date: 20200624
Lead Channel Impedance Value: 390 Ohm
Lead Channel Impedance Value: 488 Ohm
Lead Channel Sensing Intrinsic Amplitude: 0.9 mV
Lead Channel Sensing Intrinsic Amplitude: 13.5 mV
Lead Channel Setting Pacing Amplitude: 1.8 V
Lead Channel Setting Pacing Amplitude: 3 V
Lead Channel Setting Pacing Pulse Width: 0.4 ms
Pulse Gen Model: 407145
Pulse Gen Serial Number: 69638782

## 2023-12-01 ENCOUNTER — Other Ambulatory Visit: Payer: Self-pay | Admitting: Hematology and Oncology

## 2023-12-01 DIAGNOSIS — N6310 Unspecified lump in the right breast, unspecified quadrant: Secondary | ICD-10-CM

## 2023-12-12 NOTE — Progress Notes (Signed)
 Remote pacemaker transmission.

## 2023-12-18 ENCOUNTER — Telehealth: Payer: Self-pay

## 2023-12-19 ENCOUNTER — Encounter: Payer: Self-pay | Admitting: Adult Health

## 2023-12-19 ENCOUNTER — Ambulatory Visit: Payer: Medicare Other | Admitting: Adult Health

## 2023-12-25 ENCOUNTER — Ambulatory Visit
Admission: RE | Admit: 2023-12-25 | Discharge: 2023-12-25 | Disposition: A | Discharge status: Home / Self Care | Payer: Medicare Other | Source: Ambulatory Visit | Attending: Hematology and Oncology | Admitting: Hematology and Oncology

## 2023-12-25 DIAGNOSIS — N6310 Unspecified lump in the right breast, unspecified quadrant: Secondary | ICD-10-CM

## 2024-01-01 NOTE — Telephone Encounter (Signed)
 spoke with DME no airview or record becuase they believe due to cost

## 2024-01-01 NOTE — Telephone Encounter (Signed)
 spoke w/ pt adapt took the machince back due to her not being able to use she choose to cancel until she can talk to her facilty  Outgoing call

## 2024-01-04 ENCOUNTER — Ambulatory Visit: Payer: Medicare Other | Admitting: Adult Health

## 2024-01-04 ENCOUNTER — Encounter: Payer: Self-pay | Admitting: Adult Health

## 2024-01-04 VITALS — BP 130/70 | HR 60 | Temp 98.1°F | Ht 65.0 in | Wt 198.0 lb

## 2024-01-04 DIAGNOSIS — G4733 Obstructive sleep apnea (adult) (pediatric): Secondary | ICD-10-CM | POA: Diagnosis not present

## 2024-01-04 DIAGNOSIS — I5042 Chronic combined systolic (congestive) and diastolic (congestive) heart failure: Secondary | ICD-10-CM | POA: Diagnosis not present

## 2024-01-04 DIAGNOSIS — G4734 Idiopathic sleep related nonobstructive alveolar hypoventilation: Secondary | ICD-10-CM | POA: Diagnosis not present

## 2024-01-04 NOTE — Patient Instructions (Addendum)
 Begin Oxygen 2l/m At bedtime   Sleep with head of bed at 30 degree incline .  Avoid sleeping on your back, try to sleep on your side.  Healthy sleep regimen  Work on healthy weight  Follow up with Cardiology as planned.  Follow up with Dr. Wynona Neat or Soffia Doshier NP in 6 months and As needed

## 2024-01-04 NOTE — Progress Notes (Signed)
 @Patient  ID: [name], female    DOB: October 27, 1932, 89 y.o.   MRN: [id number]  Chief Complaint  Patient presents with   Follow-up   Discussed the use of AI scribe software for clinical note transcription with the patient, who gave verbal consent to proceed.  Referring provider: No ref. provider found  HPI: 89 year old female never smoker seen for sleep consult September 2024 for sleep apnea  Medical history significant for A-fib, complete heart block status post PPM, congestive heart failure, chronic kidney disease, history of DVT, history of breast cancer  TEST/EVENTS :  Split night study 08/02/19 showed severe OSA , AHI 46.8/h, SpO2 Low 80%, Optimal control on BIPAP 17/13 cm .  BiPAP titration study November 2024 showed optimal control on BiPAP 15 over 11 cm H2O.  01/04/2024 Follow up : OSA  The patient is an 89 year old with severe sleep apnea who presents for a 6 month follow up.   She has severe sleep apnea, initially evaluated in September 2024, with symptoms of snoring, witnessed apneic events, gasping for air, and significant daytime sleepiness. A split-night sleep study in 2020 revealed very severe sleep apnea with an apnea-hypopnea index (AHI) of 46.8 events per hour and oxygen saturation dropping to 80%. She was recommended BiPAP therapy with settings of IPAP max at 17 cm H2O and EPAP min at 13 cm H2O. However, did not start this.  At last visit she was set up for a BiPAP titration study that was completed November 2024 that showed optimal control on BiPAP 15 over 11 cm H2O.  Patient says she received her new BiPAP but was unable to tolerate it she tried it several times but says that it smothered her and made her breathing worse and she absolutely will not wear BiPAP.  Does have an intermittent cough that comes and goes. BIPAP made this worse.  She is a never smoker.  No history of asthma or COPD.  She was previously on oxygen in the past.  Previous sleep study showed nocturnal  desaturations with an SpO2 low at 80%.  We discussed restarting oxygen at bedtime.  She has a history of congestive heart failure, A-fib and complete heart block status post pacemaker.  She is followed by cardiology.  2D echo March 2024 showed EF at 45 to 50%, reduced right ventricular systolic function, RV size mildly enlarged and severely elevated pulmonary artery systolic pressure at 74.9 mmHg.  She remains on amiodarone, Coreg, Eliquis and Demadex. Chest xray 05/2023 showed left basilar atelectasis.    Socially, she resides in a skilled nursing facility, [facility] in [place], due to falls and lack of home care support. Her family, including her daughter and granddaughter, face their own health challenges, limiting their ability to visit her frequently.         Allergies  Allergen Reactions   Aspirin Itching   Penicillins Itching and Rash    DID THE REACTION INVOLVE: Swelling of the face/tongue/throat, SOB, or low BP? Yes Sudden or severe rash/hives, skin peeling, or the inside of the mouth or nose? No Did it require medical treatment? Yes When did it last happen? "More than 10 years ago" If all above answers are "NO", may proceed with cephalosporin use.  Tolerated full course of Unasyn in 2021.   Remeron [Mirtazapine] Other (See Comments)    Unknown reaction    Immunization History  Administered Date(s) Administered   DTaP 12/13/2012   Fluad Quad(high Dose 65+) 07/16/2020, 07/25/2023  Influenza, High Dose Seasonal PF 08/15/2018   Influenza, Quadrivalent, Recombinant, Inj, Pf 09/20/2017   Moderna Covid-19 Vaccine Bivalent Booster 9yrs & up 10/13/2022   Pneumococcal-Unspecified 04/04/2016    Past Medical History:  Diagnosis Date   Abnormal echocardiogram    a. possible mass on echo 05/2019 on PPM, b. no evidence of mass / atrial lead vegetation on follow up echo 06/2019   Anemia    Arthritis    Breast cancer (HCC) 10/2018   Invasive ductal carcinoma   Cancer (HCC)     Cataract    Chronic combined systolic and diastolic CHF (congestive heart failure) (HCC)    a. Previously diastolic but patient AVS from outside hospital listed systolic CHF and cardiomyopathy, records pending.   CKD (chronic kidney disease), stage III (HCC)    Clotting disorder (HCC)    CVA (cerebral vascular accident) (HCC)    residual right sided weakness and mild dysphagia   Diabetes mellitus (HCC)    Dizziness and giddiness    DVT (deep venous thrombosis) (HCC)    a. on anticoagulation for this.   Essential hypertension    LBBB (left bundle branch block)    Mitral regurgitation    a. Mod MR by echo 2014.   Muscular deconditioning    Obesity    Pacemaker    Pericardial effusion    SVT (supraventricular tachycardia) (HCC)    a. In 2013 she had an EPS with ablation for SVT which did not eliminate the SVT completely. She also had bradycardia which limited medication. She was placed on amiodarone by Dr. Ladona Ridgel.   Thrombocytopenia (HCC) 11/21/2011    Tobacco History: Social History   Tobacco Use  Smoking Status Never  Smokeless Tobacco Never   Counseling given: Not Answered   Outpatient Medications Prior to Visit  Medication Sig Dispense Refill   acetaminophen (TYLENOL) 650 MG CR tablet Take 650 mg by mouth every 8 (eight) hours.     amiodarone (PACERONE) 200 MG tablet Take one tablet by mouth daily Monday-Friday. Do NOT take Saturday and Sunday 90 tablet 3   amLODipine (NORVASC) 5 MG tablet Take 5 mg by mouth daily.     Docusate Sodium 100 MG capsule Take 100 mg by mouth every morning.     Dulaglutide 3 MG/0.5ML SOPN Inject 1.5 mg into the skin every 7 (seven) days. tuesday     ELIQUIS 2.5 MG TABS tablet TAKE 1 TABLET(2.5 MG) BY MOUTH TWICE DAILY (Patient taking differently: Take 2.5 mg by mouth 2 (two) times daily.) 60 tablet 2   escitalopram (LEXAPRO) 5 MG tablet Take 5 mg by mouth daily.     guaifenesin (HUMIBID E) 400 MG TABS tablet Take 400 mg by mouth. Take 1 1/2  tablets by mouth at bedtime     insulin glargine (LANTUS) 100 unit/mL SOPN Inject 23 Units into the skin at bedtime.     insulin lispro (HUMALOG KWIKPEN) 100 UNIT/ML KwikPen Before each meal 3 times a day, 140-199 - 2 units, 200-250 - 4 units, 251-299 - 6 units,  300-349 - 8 units,  350 or above 10 units.     loratadine (CLARITIN) 10 MG tablet Take 10 mg by mouth daily.     losartan (COZAAR) 25 MG tablet Take 1 tablet (25 mg total) by mouth daily.     omeprazole (PRILOSEC OTC) 20 MG tablet Take 20 mg by mouth every morning.     oxybutynin (DITROPAN) 5 MG tablet TAKE 1 TABLET(5 MG) BY MOUTH TWICE  DAILY (Patient taking differently: Take 5 mg by mouth daily.) 180 tablet 1   Potassium Chloride ER 20 MEQ TBCR Take 20 mEq by mouth daily.     pregabalin (LYRICA) 50 MG capsule Take 50 mg by mouth every morning.     pregabalin (LYRICA) 75 MG capsule Take 1 capsule (75 mg total) by mouth at bedtime. 90 capsule 1   senna (SENOKOT) 8.6 MG tablet Take 2 tablets by mouth at bedtime.     torsemide (DEMADEX) 20 MG tablet Take 40 mg every morning.  Take 20-40 mg daily every afternoon as needed for swelling or shortness of breath (Patient taking differently: Take 40 mg by mouth daily. Take 40 mg every morning.  Take 20-40 mg daily every afternoon as needed for swelling or shortness of breath) 360 tablet 3   No facility-administered medications prior to visit.     Review of Systems:   Constitutional:   No  weight loss, night sweats,  Fevers, chills, +fatigue, or  lassitude.  HEENT:   No headaches,  Difficulty swallowing,  Tooth/dental problems, or  Sore throat,                No sneezing, itching, ear ache, nasal congestion, post nasal drip,   CV:  No chest pain,  Orthopnea, PND, swelling in lower extremities, anasarca, dizziness, palpitations, syncope.   GI  No heartburn, indigestion, abdominal pain, nausea, vomiting, diarrhea, change in bowel habits, loss of appetite, bloody stools.   Resp:   No chest  wall deformity  Skin: no rash or lesions.  GU: no dysuria, change in color of urine, no urgency or frequency.  No flank pain, no hematuria   MS:  No joint pain or swelling.  No decreased range of motion.  No back pain.    Physical Exam  BP 130/70 (BP Location: Left Arm, Patient Position: Sitting, Cuff Size: Large)   Pulse 60   Temp 98.1 F (36.7 C) (Oral)   Ht 5\' 5"  (1.651 m)   Wt 198 lb (89.8 kg)   SpO2 98%   BMI 32.95 kg/m   GEN: A/Ox3; pleasant , NAD, elderly in wc    HEENT:  /AT, NOSE-clear, THROAT-clear, no lesions, no postnasal drip or exudate noted.   NECK:  Supple w/ fair ROM; no JVD; normal carotid impulses w/o bruits; no thyromegaly or nodules palpated; no lymphadenopathy.    RESP  Clear  P & A; w/o, wheezes/ rales/ or rhonchi. no accessory muscle use, no dullness to percussion  CARD:  RRR, no m/r/g, 1+ peripheral edema, pulses intact, no cyanosis or clubbing.  GI:   Soft & nt; nml bowel sounds; no organomegaly or masses detected.   Musco: Warm bil, no deformities or joint swelling noted.   Neuro: alert, no focal deficits noted.    Skin: Warm, no lesions or rashes    Lab Results:   BMET    Administration History     None          Latest Ref Rng & Units 07/25/2019    4:00 PM  PFT Results  FVC-Pre L 2.11   FVC-Predicted Pre % 140   FVC-Post L 2.13   FVC-Predicted Post % 141   Pre FEV1/FVC % % 82   Post FEV1/FCV % % 86   FEV1-Pre L 1.72   FEV1-Predicted Pre % 149   FEV1-Post L 1.83   DLCO uncorrected ml/min/mmHg 15.57   DLCO UNC% % 94   DLCO corrected ml/min/mmHg 17.75  DLCO COR %Predicted % 107   DLVA Predicted % 106   TLC L 4.92   TLC % Predicted % 108   RV % Predicted % 109     No results found for: "NITRICOXIDE"      Assessment & Plan:  Assessment and Plan    Obstructive Sleep Apnea   Severe obstructive sleep apnea - BiPAP therapy was recommended but not tolerated.  Original sleep study did show nocturnal hypoxemia  she has underlying congestive heart failure, pulmonary hypertension, A-fib would recommend beginning nocturnal oxygen at 2 L.  Depending on insurance qualifications may need to do an overnight pulse oximeter for qualifications for now order has been sent to skilled nursing facility to begin oxygen at 2 L.  Patient was educated on potential complications of untreated sleep apnea.  Would recommend sleeping with her head of the bed at incline at 30 degrees and avoiding sleeping in a supine position.   Advise sleeping with the head of the bed elevated at 30 degrees and encourage side sleeping rather than a supine position.  Intermittent cough -patient complains of an intermittent cough that comes and goes she has had this since she had COVID-19 infection a couple years ago.  Chest x-ray 2024 showed no acute process.  She has no history of smoking, asthma or COPD.  If continues will need to further workup and imaging.  Of note patient is on amiodarone.  Congestive Heart Failure   She has combined systolic and diastolic heart failure . Ensure she keeps follow-up with cardiologist as planned.   Atrial Fibrillation  -hx of A Fib . Continue on current regimen and follow up with Cards   Nocturnal Hypoxemia-begin O2 at 2l/ At bedtime  .          Rubye Oaks, NP 01/04/2024

## 2024-02-01 ENCOUNTER — Ambulatory Visit (INDEPENDENT_AMBULATORY_CARE_PROVIDER_SITE_OTHER): Payer: Medicare Other

## 2024-02-01 DIAGNOSIS — I440 Atrioventricular block, first degree: Secondary | ICD-10-CM

## 2024-02-01 LAB — CUP PACEART REMOTE DEVICE CHECK
Battery Voltage: 60
Date Time Interrogation Session: 20250410083853
Implantable Lead Connection Status: 753985
Implantable Lead Connection Status: 753985
Implantable Lead Implant Date: 20200624
Implantable Lead Implant Date: 20200624
Implantable Lead Location: 753859
Implantable Lead Location: 753860
Implantable Lead Model: 377
Implantable Lead Model: 377
Implantable Lead Serial Number: 81195510
Implantable Pulse Generator Implant Date: 20200624
Pulse Gen Model: 407145
Pulse Gen Serial Number: 69638782

## 2024-02-06 ENCOUNTER — Encounter: Payer: Self-pay | Admitting: Internal Medicine

## 2024-02-07 ENCOUNTER — Inpatient Hospital Stay: Payer: Medicare Other | Attending: Hematology and Oncology | Admitting: Hematology and Oncology

## 2024-02-07 VITALS — BP 158/65 | HR 59 | Temp 97.8°F | Resp 20 | Ht 65.0 in | Wt 193.8 lb

## 2024-02-07 DIAGNOSIS — C50311 Malignant neoplasm of lower-inner quadrant of right female breast: Secondary | ICD-10-CM | POA: Diagnosis present

## 2024-02-07 DIAGNOSIS — Z79811 Long term (current) use of aromatase inhibitors: Secondary | ICD-10-CM | POA: Diagnosis not present

## 2024-02-07 DIAGNOSIS — R0602 Shortness of breath: Secondary | ICD-10-CM | POA: Insufficient documentation

## 2024-02-07 DIAGNOSIS — Z170 Estrogen receptor positive status [ER+]: Secondary | ICD-10-CM | POA: Insufficient documentation

## 2024-02-07 NOTE — Progress Notes (Signed)
 Patient Care Team: Pcp, No as PCP - General Corky Crafts, MD as PCP - Cardiology (Cardiology) Marinus Maw, MD as PCP - Electrophysiology (Cardiology) Glenna Fellows, MD (Inactive) as Consulting Physician (General Surgery) Serena Croissant, MD as Consulting Physician (Hematology and Oncology) Dorothy Puffer, MD as Consulting Physician (Radiation Oncology)  DIAGNOSIS:  Encounter Diagnosis  Name Primary?   Malignant neoplasm of lower-inner quadrant of right breast of female, estrogen receptor positive (HCC) Yes    SUMMARY OF ONCOLOGIC HISTORY: Oncology History  Malignant neoplasm of lower-inner quadrant of right breast of female, estrogen receptor positive (HCC)  11/19/2018 Initial Diagnosis   Palpable right breast mass with calcifications, mammogram revealed focal asymmetry lower right breast 5:00 measuring 4.6 cm, ultrasound revealed overall size of 5.7 cm ultrasound-guided biopsy revealed grade 2 IDC ER 90% PR 80% Ki-67 5%, HER-2 negative, T3N0 stage IIa clinical stage   11/28/2018 Cancer Staging   Staging form: Breast, AJCC 8th Edition - Clinical: Stage IIA (cT3, cN0, cM0, G2, ER+, PR+, HER2-) - Signed by Serena Croissant, MD on 11/28/2018   11/28/2018 -  Neo-Adjuvant Anti-estrogen oral therapy   Anastrozole daily, no surgery planned due to comorbidities     CHIEF COMPLIANT: Follow-up and surveillance of breast cancer  HISTORY OF PRESENT ILLNESS:  History of Present Illness The patient, with a history of breast cancer and kidney disease, presents with worsening kidney function and shoulder pain. The patient's kidney function has reportedly worsened since her last visit with her nephrologist. She also reports pain in her left shoulder and is due for a mammogram. The patient's breast cancer has been well-managed, with recent mammograms showing no active cancer involvement. The patient also reports shortness of breath and has been using oxygen at night. She is currently seeking a  new cardiologist after her previous one moved away. The patient also mentions having difficulty swallowing and has been drinking water before meals to help with this. She reports no issues with bowel movements.     ALLERGIES:  is allergic to aspirin, penicillins, and remeron [mirtazapine].  MEDICATIONS:  Current Outpatient Medications  Medication Sig Dispense Refill   acetaminophen (TYLENOL) 325 MG suppository Place 650 mg rectally 3 (three) times daily.     amiodarone (PACERONE) 200 MG tablet Take one tablet by mouth daily Monday-Friday. Do NOT take Saturday and Sunday 90 tablet 3   amLODipine (NORVASC) 5 MG tablet Take 5 mg by mouth daily.     carvedilol (COREG) 6.25 MG tablet Take 6.25 mg by mouth 2 (two) times daily with a meal.     Docusate Sodium 100 MG capsule Take 100 mg by mouth every morning.     ELIQUIS 2.5 MG TABS tablet TAKE 1 TABLET(2.5 MG) BY MOUTH TWICE DAILY (Patient taking differently: Take 2.5 mg by mouth 2 (two) times daily.) 60 tablet 2   escitalopram (LEXAPRO) 5 MG tablet Take 5 mg by mouth daily.     fluticasone (FLONASE) 50 MCG/ACT nasal spray Place into both nostrils daily.     guaifenesin (HUMIBID E) 400 MG TABS tablet Take 400 mg by mouth. Take 1 1/2 tablets by mouth at bedtime     insulin glargine (LANTUS) 100 UNIT/ML injection Inject 8 Units into the skin every morning.     insulin glargine (LANTUS) 100 unit/mL SOPN Inject 23 Units into the skin at bedtime. (Patient taking differently: Inject 5 Units into the skin at bedtime.)     insulin lispro (HUMALOG KWIKPEN) 100 UNIT/ML KwikPen Before each meal  3 times a day, 140-199 - 2 units, 200-250 - 4 units, 251-299 - 6 units,  300-349 - 8 units,  350 or above 10 units.     insulin lispro (HUMALOG) 100 UNIT/ML injection Inject 2 Units into the skin 3 (three) times daily before meals. As directed:  150-199=2 units 200     ipratropium-albuterol (DUONEB) 0.5-2.5 (3) MG/3ML SOLN Take 3 mLs by nebulization every 4 (four)  hours as needed.     lactulose (CHRONULAC) 10 GM/15ML solution Take by mouth as needed for mild constipation.     lidocaine (LIDODERM) 5 % Place 1 patch onto the skin daily. Remove & Discard patch within 12 hours or as directed by MD     loperamide (IMODIUM A-D) 2 MG tablet Take 2 mg by mouth 4 (four) times daily as needed for diarrhea or loose stools.     loratadine (CLARITIN) 10 MG tablet Take 10 mg by mouth daily.     losartan (COZAAR) 25 MG tablet Take 1 tablet (25 mg total) by mouth daily.     Menthol, Topical Analgesic, (BIOFREEZE COOL THE PAIN) 4 % GEL Apply topically.     mirabegron ER (MYRBETRIQ) 25 MG TB24 tablet Take 25 mg by mouth daily.     omeprazole (PRILOSEC OTC) 20 MG tablet Take 20 mg by mouth every morning.     oxybutynin (DITROPAN) 5 MG tablet TAKE 1 TABLET(5 MG) BY MOUTH TWICE DAILY (Patient taking differently: Take 5 mg by mouth daily.) 180 tablet 1   Potassium Chloride ER 20 MEQ TBCR Take 20 mEq by mouth daily.     pregabalin (LYRICA) 50 MG capsule Take 50 mg by mouth every morning.     pregabalin (LYRICA) 75 MG capsule Take 1 capsule (75 mg total) by mouth at bedtime. 90 capsule 1   senna (SENOKOT) 8.6 MG tablet Take 2 tablets by mouth at bedtime.     torsemide (DEMADEX) 20 MG tablet Take 40 mg every morning.  Take 20-40 mg daily every afternoon as needed for swelling or shortness of breath (Patient taking differently: Take 40 mg by mouth daily. Take 40 mg every morning.  Take 20-40 mg daily every afternoon as needed for swelling or shortness of breath) 360 tablet 3   Dulaglutide 3 MG/0.5ML SOPN Inject 1.5 mg into the skin every 7 (seven) days. tuesday (Patient not taking: Reported on 01/04/2024)     HYDROcodone-acetaminophen (NORCO/VICODIN) 5-325 MG tablet Take 1 tablet by mouth every 6 (six) hours as needed for moderate pain (pain score 4-6). (Patient not taking: Reported on 02/07/2024)     ondansetron (ZOFRAN) 4 MG tablet Take 4 mg by mouth every 8 (eight) hours as needed  for nausea or vomiting. (Patient not taking: Reported on 02/07/2024)     No current facility-administered medications for this visit.    PHYSICAL EXAMINATION: ECOG PERFORMANCE STATUS: 1 - Symptomatic but completely ambulatory  Vitals:   02/07/24 1102  BP: (!) 158/65  Pulse: (!) 59  Resp: 20  Temp: 97.8 F (36.6 C)  SpO2: 97%   Filed Weights   02/07/24 1102  Weight: 193 lb 12.8 oz (87.9 kg)    LABORATORY DATA:  I have reviewed the data as listed    Latest Ref Rng & Units 09/14/2023    9:44 AM 06/05/2023    2:42 AM 06/04/2023    1:46 AM  CMP  Glucose 70 - 99 mg/dL 161  096  045   BUN 8 - 27 mg/dL 33  27  43   Creatinine 0.57 - 1.00 mg/dL 2.95  6.21  3.08   Sodium 134 - 144 mmol/L 144  139  138   Potassium 3.5 - 5.2 mmol/L 3.8  4.7  4.5   Chloride 96 - 106 mmol/L 102  108  107   CO2 20 - 29 mmol/L 27  22  22    Calcium 8.7 - 10.3 mg/dL 8.6  8.4  7.9   Total Protein 6.0 - 8.5 g/dL 6.5     Total Bilirubin 0.0 - 1.2 mg/dL 0.3     Alkaline Phos 44 - 121 IU/L 90     AST 0 - 40 IU/L 15     ALT 0 - 32 IU/L 14       Lab Results  Component Value Date   WBC 3.4 (L) 06/05/2023   HGB 10.3 (L) 06/05/2023   HCT 32.4 (L) 06/05/2023   MCV 93.6 06/05/2023   PLT 102 (L) 06/05/2023   NEUTROABS 1.9 06/05/2023    ASSESSMENT & PLAN:  Malignant neoplasm of lower-inner quadrant of right breast of female, estrogen receptor positive (HCC) 11/19/2018:Palpable right breast mass with calcifications, mammogram revealed focal asymmetry lower right breast 5:00 measuring 4.6 cm, ultrasound revealed overall size of 5.7 cm ultrasound-guided biopsy revealed grade 2 IDC ER 90% PR 80% Ki-67 5%, HER-2 negative, T3N0 stage IIa clinical stage   Treatment plan: 1.  Neoadjuvant antiestrogen therapy with anastrozole started 11/28/2018-02/21/2022 2. not planning on surgery because of her health and co morbidities     Breast cancer surveillance: 1.  Mammogram 12/25/2023: No evidence of progression of the right  breast cancer.  Stable biopsy-proven malignancy in the right breast with no additional suspicious findings In spite of not being on antiestrogen therapy her mammograms have been excellent   Multiple health issues including heart disease, chronic kidney disease and lung disease   She is celebrating her 51th birthday in a few days. Her granddaughter accompanied her today for the appointment.    Orders Placed This Encounter  Procedures   MM DIAG BREAST TOMO BILATERAL    Standing Status:   Future    Expected Date:   12/26/2024    Expiration Date:   02/06/2025    Reason for Exam (SYMPTOM  OR DIAGNOSIS REQUIRED):   annual mammograms    Preferred imaging location?:   [facility]    Release to patient:   Immediate   The patient has a good understanding of the overall plan. she agrees with it. she will call with any problems that may develop before the next visit here. Total time spent: 30 mins including face to face time and time spent for planning, charting and co-ordination of care   Margert Sheerer, MD 02/07/24

## 2024-02-07 NOTE — Assessment & Plan Note (Addendum)
 11/19/2018:Palpable right breast mass with calcifications, mammogram revealed focal asymmetry lower right breast 5:00 measuring 4.6 cm, ultrasound revealed overall size of 5.7 cm ultrasound-guided biopsy revealed grade 2 IDC ER 90% PR 80% Ki-67 5%, HER-2 negative, T3N0 stage IIa clinical stage   Treatment plan: 1.  Neoadjuvant antiestrogen therapy with anastrozole started 11/28/2018-02/21/2022 2. not planning on surgery because of her health and co morbidities     Breast cancer surveillance: 1.  Mammogram 12/25/2023: No evidence of progression of the right breast cancer.  Stable biopsy-proven malignancy in the right breast with no additional suspicious findings   Multiple health issues including heart disease, chronic kidney disease and lung disease   Since patient is doing extremely well without antiestrogen therapy, we decided to watch and monitor and recheck her back in 1 year for follow-up.  She tells me that she is no longer with hospice care.  She is celebrating her 89th birthday in a few days.

## 2024-03-13 ENCOUNTER — Ambulatory Visit: Payer: Medicare Other | Admitting: Internal Medicine

## 2024-03-15 NOTE — Progress Notes (Signed)
 Remote pacemaker transmission.

## 2024-05-02 ENCOUNTER — Ambulatory Visit: Payer: Medicare Other

## 2024-05-02 DIAGNOSIS — I440 Atrioventricular block, first degree: Secondary | ICD-10-CM | POA: Diagnosis not present

## 2024-05-03 LAB — CUP PACEART REMOTE DEVICE CHECK
Battery Voltage: 60
Date Time Interrogation Session: 20250710103913
Implantable Lead Connection Status: 753985
Implantable Lead Connection Status: 753985
Implantable Lead Implant Date: 20200624
Implantable Lead Implant Date: 20200624
Implantable Lead Location: 753859
Implantable Lead Location: 753860
Implantable Lead Model: 377
Implantable Lead Model: 377
Implantable Lead Serial Number: 81195510
Implantable Pulse Generator Implant Date: 20200624
Pulse Gen Model: 407145
Pulse Gen Serial Number: 69638782

## 2024-05-06 ENCOUNTER — Ambulatory Visit: Payer: Self-pay | Admitting: Internal Medicine

## 2024-05-11 NOTE — Progress Notes (Unsigned)
  Electrophysiology Office Note:   Date:  05/16/2024  ID:  [name], DOB [birth year], MRN [id number]  Primary Cardiologist: Oneil Parchment, MD Primary Heart Failure: None Electrophysiologist: Danelle Birmingham, MD       History of Present Illness:   [name] is a 89 y.o. female with h/o AF, CHB s/p PPM, LBBB, SVT, HFpEF,  seen today for routine electrophysiology followup.   Since last being seen in our clinic the patient reports she is living at a facility in [place]. She has been doing overall well. No device related concerns. She has noted LE swelling which is not new.    She denies chest pain, palpitations, dyspnea, PND, orthopnea, nausea, vomiting, dizziness, syncope, edema, weight gain, or early satiety.   Review of systems complete and found to be negative unless listed in HPI.    EP Information / Studies Reviewed:    EKG is ordered today. Personal review as below.  EKG Interpretation Date/Time:  Thursday May 16 2024 08:47:26 EDT Ventricular Rate:  60 PR Interval:    QRS Duration:  172 QT Interval:  538 QTC Calculation: 538 R Axis:   -76  Text Interpretation: Ventricular-paced rhythm Confirmed by Aniceto Jarvis ([id number]) on 05/16/2024 8:56:41 AM   PPM Interrogation-  reviewed in detail today,  See PACEART report.  Device History: Biotronik Dual Chamber PPM implanted 03/2019 for CHB  Risk Assessment/Calculations:    CHA2DS2-VASc Score = 8   This indicates a 10.8% annual risk of stroke. The patient's score is based upon: CHF History: 1 HTN History: 1 Diabetes History: 1 Stroke History: 2 Vascular Disease History: 0 Age Score: 2 Gender Score: 1     Physical Exam:   VS:  BP (!) 140/80   Pulse 60   Ht 5' 5 (1.651 m)   Wt 197 lb (89.4 kg) Comment: PER PATIENT, PT IN WHEELCHAIR  SpO2 99%   BMI 32.78 kg/m    Wt Readings from Last 3 Encounters:  05/16/24 197 lb (89.4 kg)  02/07/24 193 lb 12.8 oz (87.9 kg)  01/04/24 198 lb (89.8 kg)     GEN: pleasant elderly  female, sitting in wheelchair, well developed in no acute distress NECK: No JVD; No carotid bruits CARDIAC: Regular rate and rhythm (VP), no murmurs, rubs, gallops RESPIRATORY:  Clear to auscultation without rales, wheezing or rhonchi  ABDOMEN: Soft, non-tender, non-distended EXTREMITIES:  LE 1-2+ chronic edema; No deformity   ASSESSMENT AND PLAN:    CHB s/p Biotronik PPM  -Normal PPM function -See Pace Art report -No changes today  Permanent Atrial Fibrillation  SVT High Risk Medication Monitoring: Amiodarone   CHA2DS2-VASc 8 -coreg  6.25 mg BID  -amiodarone  200 mg daily M-F -update amio labs > LFT's, TSH, Free T4  -continue OAC for stroke prophylaxis   Secondary Hypercoagulable State  -continue Eliquis  5mg  BID, dose reviewed and appropriate by wt / Cr   Disposition:   Follow up with Dr. Birmingham in 12 months  Signed, Jarvis Aniceto, NP-C, AGACNP-BC [facility] - Electrophysiology  05/16/2024, 12:44 PM

## 2024-05-15 ENCOUNTER — Encounter: Admitting: Internal Medicine

## 2024-05-16 ENCOUNTER — Ambulatory Visit: Attending: Pulmonary Disease | Admitting: Pulmonary Disease

## 2024-05-16 ENCOUNTER — Encounter: Payer: Self-pay | Admitting: Adult Health

## 2024-05-16 ENCOUNTER — Ambulatory Visit: Admitting: Adult Health

## 2024-05-16 ENCOUNTER — Encounter: Payer: Self-pay | Admitting: Pulmonary Disease

## 2024-05-16 ENCOUNTER — Ambulatory Visit: Attending: Cardiology | Admitting: Cardiology

## 2024-05-16 VITALS — BP 165/64 | HR 56 | Temp 97.7°F | Ht 65.0 in | Wt 197.0 lb

## 2024-05-16 VITALS — BP 140/80 | HR 60 | Ht 65.0 in | Wt 197.0 lb

## 2024-05-16 DIAGNOSIS — G4734 Idiopathic sleep related nonobstructive alveolar hypoventilation: Secondary | ICD-10-CM | POA: Diagnosis not present

## 2024-05-16 DIAGNOSIS — I471 Supraventricular tachycardia, unspecified: Secondary | ICD-10-CM

## 2024-05-16 DIAGNOSIS — I442 Atrioventricular block, complete: Secondary | ICD-10-CM

## 2024-05-16 DIAGNOSIS — I4821 Permanent atrial fibrillation: Secondary | ICD-10-CM | POA: Diagnosis not present

## 2024-05-16 DIAGNOSIS — G4733 Obstructive sleep apnea (adult) (pediatric): Secondary | ICD-10-CM

## 2024-05-16 DIAGNOSIS — Z79899 Other long term (current) drug therapy: Secondary | ICD-10-CM

## 2024-05-16 DIAGNOSIS — I10 Essential (primary) hypertension: Secondary | ICD-10-CM

## 2024-05-16 DIAGNOSIS — Z950 Presence of cardiac pacemaker: Secondary | ICD-10-CM | POA: Diagnosis not present

## 2024-05-16 DIAGNOSIS — D6869 Other thrombophilia: Secondary | ICD-10-CM

## 2024-05-16 LAB — CUP PACEART INCLINIC DEVICE CHECK
Battery Remaining Percentage: 60 %
Brady Statistic RA Percent Paced: 0 %
Brady Statistic RV Percent Paced: 85 %
Date Time Interrogation Session: 20250724090029
Implantable Lead Connection Status: 753985
Implantable Lead Connection Status: 753985
Implantable Lead Implant Date: 20200624
Implantable Lead Implant Date: 20200624
Implantable Lead Location: 753859
Implantable Lead Location: 753860
Implantable Lead Model: 377
Implantable Lead Model: 377
Implantable Lead Serial Number: 81195510
Implantable Pulse Generator Implant Date: 20200624
Lead Channel Impedance Value: 253 Ohm
Lead Channel Impedance Value: 507 Ohm
Lead Channel Pacing Threshold Amplitude: 0.8 V
Lead Channel Pacing Threshold Pulse Width: 0.4 ms
Lead Channel Setting Pacing Amplitude: 1.8 V
Lead Channel Setting Pacing Amplitude: 1.8 V
Lead Channel Setting Pacing Pulse Width: 0.4 ms
Pulse Gen Model: 407145
Pulse Gen Serial Number: 69638782

## 2024-05-16 NOTE — Progress Notes (Signed)
 @Patient  ID: [name], female    DOB: February 20, 1933, 89 y.o.   MRN: [id number]  Chief Complaint  Patient presents with   Follow-up    70mo f/u OSA    Referring provider: No ref. provider found  HPI: 89 year old sleep consult September 2024 for sleep apnea Resides in SNF  Medical history significant for A-fib, complete heart block status post PPM, congestive heart failure, chronic kidney disease, history of DVT, history of breast cancer   TEST/EVENTS :  Split night study 08/02/19 showed severe OSA , AHI 46.8/h, SpO2 Low 80%, Optimal control on BIPAP 17/13 cm .  BiPAP titration study November 2024 showed optimal control on BiPAP 15 over 11 cm H2O.  2D echo March 2024 showed EF at 45 to 50%, reduced right ventricular systolic function, RV size mildly enlarged and severely elevated pulmonary artery systolic pressure at 74.9 mmHg.   05/16/2024 Follow up: OSA  Discussed the use of AI scribe software for clinical note transcription with the patient, who gave verbal consent to proceed.  History of Present Illness [name] is an 89 year old female with sleep apnea who presents for follow-up regarding her oxygen therapy.  She has a history of sleep apnea and previously used a BiPAP machine, which she found intolerable and subsequently discontinued. Currently, she is on oxygen therapy-2l/m At bedtime  . She experiences shortness of breath with activity but notes improvement since starting oxygen therapy. She has never smoked and reports that her oxygen levels are good on O2 .   Patient has a history of congestive heart failure, A-fib. Followed by cardiology.  She remains on Eliquis  twice daily.  History of complete heart block s/p pacemaker.    She resides in a nursing home where she uses a wheelchair for mobility. She is undergoing therapy to improve her ability to stand and transfer. She participates in various activities at the nursing home, such as arts and crafts, and requires  assistance with showering.   Allergies  Allergen Reactions   Aspirin Itching   Penicillins Itching and Rash    DID THE REACTION INVOLVE: Swelling of the face/tongue/throat, SOB, or low BP? Yes Sudden or severe rash/hives, skin peeling, or the inside of the mouth or nose? No Did it require medical treatment? Yes When did it last happen? More than 10 years ago If all above answers are NO, may proceed with cephalosporin use.  Tolerated full course of Unasyn  in 2021.   Remeron  [Mirtazapine ] Other (See Comments)    Unknown reaction    Immunization History  Administered Date(s) Administered   DTaP 12/13/2012   Fluad Quad(high Dose 65+) 07/16/2020, 07/25/2023   Influenza, High Dose Seasonal PF 08/15/2018   Influenza, Quadrivalent, Recombinant, Inj, Pf 09/20/2017   Moderna Covid-19 Vaccine Bivalent Booster 26yrs & up 10/13/2022   Pneumococcal-Unspecified 04/04/2016    Past Medical History:  Diagnosis Date   Abnormal echocardiogram    a. possible mass on echo 05/2019 on PPM, b. 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 Previously diastolic but patient AVS from outside hospital listed systolic CHF and cardiomyopathy, records pending.   CKD (chronic kidney disease), stage III (HCC)    Clotting disorder (HCC)  CVA (cerebral vascular accident) (HCC)    residual right sided weakness and mild dysphagia   Diabetes mellitus (HCC)    Dizziness and giddiness    DVT (deep venous thrombosis) (HCC)    a. on anticoagulation for this.   Essential hypertension    LBBB (left bundle branch block)    Mitral regurgitation    a. Mod MR by echo 2014.   Muscular deconditioning    Obesity    Pacemaker    Pericardial effusion    SVT (supraventricular tachycardia) (HCC)     a. In 2013 she had an EPS with ablation for SVT which did not eliminate the SVT completely. She also had bradycardia which limited medication. She was placed on amiodarone  by Dr. Waddell.   Thrombocytopenia (HCC) 11/21/2011    Tobacco History: Social History   Tobacco Use  Smoking Status Never  Smokeless Tobacco Never   Counseling given: Not Answered   Outpatient Medications Prior to Visit  Medication Sig Dispense Refill   acetaminophen  (TYLENOL ) 325 MG suppository Place 650 mg rectally 3 (three) times daily.     amiodarone  (PACERONE ) 200 MG tablet Take one tablet by mouth daily Monday-Friday. Do NOT take Saturday and Sunday 90 tablet 3   amLODipine  (NORVASC ) 5 MG tablet Take 5 mg by mouth daily.     carvedilol  (COREG ) 6.25 MG tablet Take 6.25 mg by mouth 2 (two) times daily with a meal.     Docusate Sodium  100 MG capsule Take 100 mg by mouth every morning.     Dulaglutide 3 MG/0.5ML SOPN Inject 1.5 mg into the skin every 7 (seven) days. tuesday     ELIQUIS  2.5 MG TABS tablet TAKE 1 TABLET(2.5 MG) BY MOUTH TWICE DAILY 60 tablet 2   escitalopram  (LEXAPRO ) 5 MG tablet Take 5 mg by mouth daily.     fluticasone (FLONASE) 50 MCG/ACT nasal spray Place into both nostrils daily.     guaifenesin  (HUMIBID E) 400 MG TABS tablet Take 400 mg by mouth. Take 1 1/2 tablets by mouth at bedtime     HYDROcodone -acetaminophen  (NORCO/VICODIN) 5-325 MG tablet Take 1 tablet by mouth every 6 (six) hours as needed for moderate pain (pain score 4-6).     insulin  glargine (LANTUS ) 100 UNIT/ML injection Inject 8 Units into the skin every morning.     insulin  glargine (LANTUS ) 100 unit/mL SOPN Inject 23 Units into the skin at bedtime.     insulin  lispro (HUMALOG  KWIKPEN) 100 UNIT/ML KwikPen Before each meal 3 times a day, 140-199 - 2 units, 200-250 - 4 units, 251-299 - 6 units,  300-349 - 8 units,  350 or above 10 units.     insulin  lispro (HUMALOG ) 100 UNIT/ML injection Inject 2 Units into the skin 3 (three)  times daily before meals. As directed:  150-199=2 units 200     ipratropium-albuterol  (DUONEB) 0.5-2.5 (3) MG/3ML SOLN Take 3 mLs by nebulization every 4 (four) hours as needed.     lactulose  (CHRONULAC ) 10 GM/15ML solution Take by mouth as needed for mild constipation.     lidocaine (LIDODERM) 5 % Place 1 patch onto the skin daily. Remove & Discard patch within 12 hours or as directed by MD     loperamide  (IMODIUM  A-D) 2 MG tablet Take 2 mg by mouth 4 (four) times daily as needed for diarrhea or loose stools.     loratadine (CLARITIN) 10 MG tablet Take 10 mg by mouth daily.     losartan  (COZAAR ) 25 MG tablet Take  1 tablet (25 mg total) by mouth daily.     Menthol, Topical Analgesic, (BIOFREEZE COOL THE PAIN) 4 % GEL Apply topically.     mirabegron  ER (MYRBETRIQ ) 25 MG TB24 tablet Take 25 mg by mouth daily.     omeprazole  (PRILOSEC OTC) 20 MG tablet Take 20 mg by mouth every morning.     ondansetron  (ZOFRAN ) 4 MG tablet Take 4 mg by mouth every 8 (eight) hours as needed for nausea or vomiting.     oxybutynin  (DITROPAN ) 5 MG tablet TAKE 1 TABLET(5 MG) BY MOUTH TWICE DAILY (Patient taking differently: Take 5 mg by mouth daily.) 180 tablet 1   Potassium Chloride  ER 20 MEQ TBCR Take 20 mEq by mouth daily.     pregabalin  (LYRICA ) 50 MG capsule Take 50 mg by mouth every morning.     pregabalin  (LYRICA ) 75 MG capsule Take 1 capsule (75 mg total) by mouth at bedtime. 90 capsule 1   senna (SENOKOT) 8.6 MG tablet Take 2 tablets by mouth at bedtime.     torsemide  (DEMADEX ) 20 MG tablet Take 40 mg every morning.  Take 20-40 mg daily every afternoon as needed for swelling or shortness of breath 360 tablet 3   No facility-administered medications prior to visit.     Review of Systems:   Constitutional:   No  weight loss, night sweats,  Fevers, chills, +fatigue, or  lassitude.  HEENT:   No headaches,  Difficulty swallowing,  Tooth/dental problems, or  Sore throat,                No sneezing, itching,  ear ache, nasal congestion, post nasal drip,   CV:  No chest pain,  Orthopnea, PND, +swelling in lower extremities, anasarca, dizziness, palpitations, syncope.   GI  No heartburn, indigestion, abdominal pain, nausea, vomiting, diarrhea, change in bowel habits, loss of appetite, bloody stools.   Resp:  No wheezing.  No chest wall deformity  Skin: no rash or lesions.  GU: no dysuria, change in color of urine, no urgency or frequency.  No flank pain, no hematuria   MS:  No joint pain or swelling.  No decreased range of motion.  No back pain.    Physical Exam  BP (!) 165/64 (BP Location: Left Arm, Patient Position: Sitting, Cuff Size: Normal)   Pulse (!) 56   Temp 97.7 F (36.5 C) (Oral)   Ht 5' 5 (1.651 m)   Wt 197 lb (89.4 kg)   SpO2 97%   BMI 32.78 kg/m   GEN: A/Ox3; pleasant , NAD, elderly in WC    HEENT:  [place]/AT,  EACs-clear, TMs-wnl, NOSE-clear, THROAT-clear, no lesions, no postnasal drip or exudate noted.   NECK:  Supple w/ fair ROM; no JVD; normal carotid impulses w/o bruits; no thyromegaly or nodules palpated; no lymphadenopathy.    RESP  Clear  P & A; w/o, wheezes/ rales/ or rhonchi. no accessory muscle use, no dullness to percussion  CARD:  RRR, no m/r/g, tr  peripheral edema, pulses intact, no cyanosis or clubbing.  GI:   Soft & nt; nml bowel sounds; no organomegaly or masses detected.   Musco: Warm bil, no deformities or joint swelling noted.   Neuro: alert, no focal deficits noted.    Skin: Warm, no lesions or rashes    Lab Results:  CBC    Administration History     None          Latest Ref Rng & Units 07/25/2019  4:00 PM  PFT Results  FVC-Pre L 2.11   FVC-Predicted Pre % 140   FVC-Post L 2.13   FVC-Predicted Post % 141   Pre FEV1/FVC % % 82   Post FEV1/FCV % % 86   FEV1-Pre L 1.72   FEV1-Predicted Pre % 149   FEV1-Post L 1.83   DLCO uncorrected ml/min/mmHg 15.57   DLCO UNC% % 94   DLCO corrected ml/min/mmHg 17.75   DLCO COR  %Predicted % 107   DLVA Predicted % 106   TLC L 4.92   TLC % Predicted % 108   RV % Predicted % 109     No results found for: NITRICOXIDE      Assessment & Plan:   No problem-specific Assessment & Plan notes found for this encounter.  Assessment and Plan Assessment & Plan Sleep apnea  -Severe -BIPAP intolerant  Continue on positional sleep with head of bed elevated at 30 degrees. Continue on oxygen 2l/m At bedtime  She previously found BiPAP intolerable due to discomfort and difficulty using the device. She reports improvement with oxygen therapy.    Physical Deconditioning  She requires a wheelchair due to mobility impairment and is undergoing therapy to improve standing and transfer abilities. Assistance is needed for showering and other daily activities.  A Fib, CHB s/p PPM - continue follow up with Cardiology        Madelin Stank, NP 05/16/2024

## 2024-05-16 NOTE — Patient Instructions (Signed)
 Medication Instructions:  Your physician recommends that you continue on your current medications as directed. Please refer to the Current Medication list given to you today.  *If you need a refill on your cardiac medications before your next appointment, please call your pharmacy*  Lab Work: CMET, TSH, FreeT4-TODAY If you have labs (blood work) drawn today and your tests are completely normal, you will receive your results only by: MyChart Message (if you have MyChart) OR A paper copy in the mail If you have any lab test that is abnormal or we need to change your treatment, we will call you to review the results.  Follow-Up: At [facility], you and your health needs are our priority.  As part of our continuing mission to provide you with exceptional heart care, our providers are all part of one team.  This team includes your primary Cardiologist (physician) and Advanced Practice Providers or APPs (Physician Assistants and Nurse Practitioners) who all work together to provide you with the care you need, when you need it.  Your next appointment:   1 year(s)  Provider:   You will see one of the following Advanced Practice Providers on your designated Care Team:   Charlies Arthur, PA-C Michael Andy Tillery, PA-C Suzann Riddle, NP Daphne Barrack, NP

## 2024-05-16 NOTE — Patient Instructions (Addendum)
 Continue on Oxygen 2l/m At bedtime   Sleep with head of bed at 30 degree incline .  Avoid sleeping on your back, try to sleep on your side.  Healthy sleep regimen  Work on healthy weight  Follow up with Cardiology as planned.  Follow up with Dr. Neda or Ayrton Mcvay NP in 1 year and As needed

## 2024-05-17 ENCOUNTER — Ambulatory Visit: Payer: Self-pay | Admitting: Pulmonary Disease

## 2024-05-17 LAB — T4, FREE: Free T4: 1.29 ng/dL (ref 0.82–1.77)

## 2024-05-17 LAB — COMPREHENSIVE METABOLIC PANEL WITH GFR
ALT: 10 IU/L (ref 0–32)
AST: 17 IU/L (ref 0–40)
Albumin: 4.2 g/dL (ref 3.7–4.7)
Alkaline Phosphatase: 101 IU/L (ref 44–121)
BUN/Creatinine Ratio: 16 (ref 12–28)
BUN: 25 mg/dL (ref 8–27)
Bilirubin Total: 0.4 mg/dL (ref 0.0–1.2)
CO2: 25 mmol/L (ref 20–29)
Calcium: 9 mg/dL (ref 8.7–10.3)
Chloride: 100 mmol/L (ref 96–106)
Creatinine, Ser: 1.52 mg/dL — ABNORMAL HIGH (ref 0.57–1.00)
Globulin, Total: 3 g/dL (ref 1.5–4.5)
Glucose: 168 mg/dL — ABNORMAL HIGH (ref 70–99)
Potassium: 4 mmol/L (ref 3.5–5.2)
Sodium: 146 mmol/L — ABNORMAL HIGH (ref 134–144)
Total Protein: 7.2 g/dL (ref 6.0–8.5)
eGFR: 33 mL/min/1.73 — ABNORMAL LOW (ref >59)

## 2024-05-17 LAB — TSH: TSH: 1.73 u[IU]/mL (ref 0.450–4.500)

## 2024-07-28 ENCOUNTER — Other Ambulatory Visit: Payer: Self-pay

## 2024-07-28 ENCOUNTER — Encounter (HOSPITAL_COMMUNITY): Payer: Self-pay

## 2024-07-28 ENCOUNTER — Emergency Department (HOSPITAL_COMMUNITY)
Admission: EM | Admit: 2024-07-28 | Discharge: 2024-07-29 | Disposition: A | Discharge status: Home / Self Care | Attending: Emergency Medicine | Admitting: Emergency Medicine

## 2024-07-28 ENCOUNTER — Emergency Department (HOSPITAL_COMMUNITY)

## 2024-07-28 DIAGNOSIS — Z7901 Long term (current) use of anticoagulants: Secondary | ICD-10-CM | POA: Insufficient documentation

## 2024-07-28 DIAGNOSIS — R600 Localized edema: Secondary | ICD-10-CM | POA: Insufficient documentation

## 2024-07-28 DIAGNOSIS — J449 Chronic obstructive pulmonary disease, unspecified: Secondary | ICD-10-CM | POA: Insufficient documentation

## 2024-07-28 DIAGNOSIS — N179 Acute kidney failure, unspecified: Secondary | ICD-10-CM | POA: Diagnosis not present

## 2024-07-28 DIAGNOSIS — Z853 Personal history of malignant neoplasm of breast: Secondary | ICD-10-CM | POA: Insufficient documentation

## 2024-07-28 DIAGNOSIS — Z794 Long term (current) use of insulin: Secondary | ICD-10-CM | POA: Insufficient documentation

## 2024-07-28 DIAGNOSIS — R051 Acute cough: Secondary | ICD-10-CM | POA: Insufficient documentation

## 2024-07-28 DIAGNOSIS — E119 Type 2 diabetes mellitus without complications: Secondary | ICD-10-CM | POA: Insufficient documentation

## 2024-07-28 DIAGNOSIS — R059 Cough, unspecified: Secondary | ICD-10-CM | POA: Diagnosis present

## 2024-07-28 DIAGNOSIS — Z950 Presence of cardiac pacemaker: Secondary | ICD-10-CM | POA: Diagnosis not present

## 2024-07-28 DIAGNOSIS — R0602 Shortness of breath: Secondary | ICD-10-CM | POA: Diagnosis not present

## 2024-07-28 DIAGNOSIS — R0781 Pleurodynia: Secondary | ICD-10-CM | POA: Diagnosis not present

## 2024-07-28 LAB — COMPREHENSIVE METABOLIC PANEL WITH GFR
ALT: 21 U/L (ref 0–44)
AST: 23 U/L (ref 15–41)
Albumin: 3 g/dL — ABNORMAL LOW (ref 3.5–5.0)
Alkaline Phosphatase: 62 U/L (ref 38–126)
Anion gap: 12 (ref 5–15)
BUN: 41 mg/dL — ABNORMAL HIGH (ref 8–23)
CO2: 28 mmol/L (ref 22–32)
Calcium: 8.4 mg/dL — ABNORMAL LOW (ref 8.9–10.3)
Chloride: 102 mmol/L (ref 98–111)
Creatinine, Ser: 1.95 mg/dL — ABNORMAL HIGH (ref 0.44–1.00)
GFR, Estimated: 24 mL/min — ABNORMAL LOW (ref >60)
Glucose, Bld: 108 mg/dL — ABNORMAL HIGH (ref 70–99)
Potassium: 3.7 mmol/L (ref 3.5–5.1)
Sodium: 142 mmol/L (ref 135–145)
Total Bilirubin: 0.5 mg/dL (ref 0.0–1.2)
Total Protein: 6.6 g/dL (ref 6.5–8.1)

## 2024-07-28 LAB — RESP PANEL BY RT-PCR (RSV, FLU A&B, COVID)  RVPGX2
Influenza A by PCR: NEGATIVE
Influenza B by PCR: NEGATIVE
Resp Syncytial Virus by PCR: NEGATIVE
SARS Coronavirus 2 by RT PCR: NEGATIVE

## 2024-07-28 LAB — TROPONIN I (HIGH SENSITIVITY): Troponin I (High Sensitivity): 61 ng/L — ABNORMAL HIGH (ref <18)

## 2024-07-28 LAB — CBC
HCT: 34.9 % — ABNORMAL LOW (ref 36.0–46.0)
Hemoglobin: 10.5 g/dL — ABNORMAL LOW (ref 12.0–15.0)
MCH: 28.5 pg (ref 26.0–34.0)
MCHC: 30.1 g/dL (ref 30.0–36.0)
MCV: 94.6 fL (ref 80.0–100.0)
Platelets: 115 K/uL — ABNORMAL LOW (ref 150–400)
RBC: 3.69 MIL/uL — ABNORMAL LOW (ref 3.87–5.11)
RDW: 14.1 % (ref 11.5–15.5)
WBC: 4.2 K/uL (ref 4.0–10.5)
nRBC: 0 % (ref 0.0–0.2)

## 2024-07-28 LAB — PROTIME-INR
INR: 1 (ref 0.8–1.2)
Prothrombin Time: 14.2 s (ref 11.4–15.2)

## 2024-07-28 NOTE — ED Triage Notes (Signed)
 Arrives [facility] from [facility] reports productive cough and weakness.   Has been prescribed antibiotics for unknown reason per facility. Staff report reading RX wrong and only gave 1 singular dose.   EMS Administer:  15mg  Albuterol  0.5mg  Atrovent   125mg  Solumedrol 650mg  Tylenol  for suspected fever

## 2024-07-28 NOTE — ED Provider Notes (Signed)
 [place] EMERGENCY DEPARTMENT AT [facility] Provider Note   CSN: [id number] Arrival date & time: 07/28/24  2205     Patient presents with: Cough   [name] is a 89 y.o. female coming from SNF in [place] who presents with concern for productive cough x 3 to 4 days and rib soreness, generalized fatigue.  Symptoms started after she received her flu vaccination.  Reportedly was on antibiotics at her facility but staff was unsure why therefore they only administered 1 dose.  Does have history of COPD and wears 2 L home oxygen at nighttime when she sleeping.  Received albuterol , Atrovent  Solu-Medrol  and Tylenol  and route with EMS she is anticoagulated on Eliquis  2.5 mg twice daily***  History of CVA with residual right-sided weakness, DVT, SVT status post ablation in 2013, type 2 diabetes, breast cancer.  Pacemaker in place  {Add pertinent medical, surgical, social history, OB history to HPI:32947} HPI     Prior to Admission medications   Medication Sig Start Date End Date Taking? Authorizing Provider  acetaminophen  (TYLENOL ) 325 MG suppository Place 650 mg rectally 3 (three) times daily.    [provider]  amiodarone  (PACERONE ) 200 MG tablet Take one tablet by mouth daily Monday-Friday. Do NOT take Saturday and Sunday 06/21/22   Lesia Ozell Barter, PA-C  amLODipine  (NORVASC ) 5 MG tablet Take 5 mg by mouth daily. 01/26/23   [provider]  carvedilol  (COREG ) 6.25 MG tablet Take 6.25 mg by mouth 2 (two) times daily with a meal.    [provider]  Docusate Sodium  100 MG capsule Take 100 mg by mouth every morning.    [provider]  Dulaglutide 3 MG/0.5ML SOPN Inject 1.5 mg into the skin every 7 (seven) days. tuesday    [provider]  ELIQUIS  2.5 MG TABS tablet TAKE 1 TABLET(2.5 MG) BY MOUTH TWICE DAILY 07/13/20   Dunn, Dayna N, PA-C  escitalopram  (LEXAPRO ) 5 MG tablet Take 5 mg by mouth daily. 03/25/23   [provider]  fluticasone (FLONASE) 50 MCG/ACT nasal spray Place into both nostrils daily.    [provider]  guaifenesin  (HUMIBID E) 400 MG TABS tablet Take 400 mg by mouth. Take 1 1/2 tablets by mouth at bedtime    [provider]  HYDROcodone -acetaminophen  (NORCO/VICODIN) 5-325 MG tablet Take 1 tablet by mouth every 6 (six) hours as needed for moderate pain (pain score 4-6).    [provider]  insulin  glargine (LANTUS ) 100 UNIT/ML injection Inject 8 Units into the skin every morning.    [provider]  insulin  glargine (LANTUS ) 100 unit/mL SOPN Inject 23 Units into the skin at bedtime. 06/05/23   Singh, Prashant K, MD  insulin  lispro (HUMALOG  KWIKPEN) 100 UNIT/ML KwikPen Before each meal 3 times a day, 140-199 - 2 units, 200-250 - 4 units, 251-299 - 6 units,  300-349 - 8 units,  350 or above 10 units. 06/05/23   Singh, Prashant K, MD  insulin  lispro (HUMALOG ) 100 UNIT/ML injection Inject 2 Units into the skin 3 (three) times daily before meals. As directed:  150-199=2 units 200    [provider]  ipratropium-albuterol  (DUONEB) 0.5-2.5 (3) MG/3ML SOLN Take 3 mLs by nebulization every 4 (four) hours as needed.    [provider]  lactulose  (CHRONULAC ) 10 GM/15ML solution Take by mouth as needed for mild constipation.    [provider]  lidocaine (LIDODERM) 5 % Place 1 patch onto the skin daily. Remove &  Discard patch within 12 hours or as directed by MD    [provider]  loperamide  (IMODIUM  A-D) 2 MG tablet Take 2 mg by mouth 4 (four) times daily as needed for diarrhea or loose stools.    [provider]  loratadine (CLARITIN) 10 MG tablet Take 10 mg by mouth daily.    [provider]  losartan  (COZAAR ) 25 MG tablet Take 1 tablet (25 mg total) by mouth daily. 06/07/23 06/06/24  Singh, Prashant K, MD  Menthol, Topical Analgesic, (BIOFREEZE COOL THE PAIN) 4 % GEL Apply topically.    [provider]   mirabegron  ER (MYRBETRIQ ) 25 MG TB24 tablet Take 25 mg by mouth daily.    [provider]  omeprazole  (PRILOSEC OTC) 20 MG tablet Take 20 mg by mouth every morning.    [provider]  ondansetron  (ZOFRAN ) 4 MG tablet Take 4 mg by mouth every 8 (eight) hours as needed for nausea or vomiting.    [provider]  oxybutynin  (DITROPAN ) 5 MG tablet TAKE 1 TABLET(5 MG) BY MOUTH TWICE DAILY Patient taking differently: Take 5 mg by mouth daily. 12/23/19   Jarold Medici, MD  Potassium Chloride  ER 20 MEQ TBCR Take 20 mEq by mouth daily.    [provider]  pregabalin  (LYRICA ) 50 MG capsule Take 50 mg by mouth every morning. 06/10/22   [provider]  pregabalin  (LYRICA ) 75 MG capsule Take 1 capsule (75 mg total) by mouth at bedtime. 06/30/20   Moore, Janece, FNP  senna (SENOKOT) 8.6 MG tablet Take 2 tablets by mouth at bedtime.    [provider]  torsemide  (DEMADEX ) 20 MG tablet Take 40 mg every morning.  Take 20-40 mg daily every afternoon as needed for swelling or shortness of breath 04/10/23   Dann Candyce RAMAN, MD    Allergies: Aspirin, Penicillins, and Remeron  [mirtazapine ]    Review of Systems  Constitutional:  Positive for appetite change, chills, fatigue and fever.  HENT: Negative.    Respiratory:  Positive for cough, chest tightness, shortness of breath and wheezing.   Cardiovascular:  Positive for chest pain.  Gastrointestinal: Negative.   Genitourinary: Negative.   Musculoskeletal: Negative.   Neurological: Negative.     Updated Vital Signs Ht 5' 5 (1.651 m)   Wt 88.5 kg   BMI 32.45 kg/m   Physical Exam Vitals and nursing note reviewed.  Constitutional:      Appearance: She is obese. She is not ill-appearing or toxic-appearing.  HENT:     Head: Normocephalic and atraumatic.     Mouth/Throat:     Mouth: Mucous membranes are moist.     Pharynx: Oropharynx is clear. No oropharyngeal exudate or posterior oropharyngeal  erythema.  Eyes:     General:        Right eye: No discharge.        Left eye: No discharge.     Conjunctiva/sclera: Conjunctivae normal.  Cardiovascular:     Rate and Rhythm: Normal rate and regular rhythm.     Pulses: Normal pulses.  Pulmonary:     Effort: Pulmonary effort is normal. Tachypnea present. No respiratory distress.     Breath sounds: Normal breath sounds. No wheezing or rales.     Comments: Coarse breath sounds throughout. Abdominal:     General: Bowel sounds are normal. There is no distension.     Tenderness: There is no abdominal tenderness.  Musculoskeletal:        General: No deformity.  Cervical back: Normal range of motion and neck supple.     Right lower leg: 1+ Edema present.     Left lower leg: 1+ Edema present.  Skin:    General: Skin is warm and dry.  Neurological:     Mental Status: She is alert and oriented to person, place, and time. Mental status is at baseline.  Psychiatric:        Mood and Affect: Mood normal.     (all labs ordered are listed, but only abnormal results are displayed) Labs Reviewed - No data to display  EKG: None  Radiology: No results found.  {Document cardiac monitor, telemetry assessment procedure when appropriate:32947} Procedures   Medications Ordered in the ED - No data to display    {Click here for ABCD2, HEART and other calculators REFRESH Note before signing:1}                              Medical Decision Making 89 year old female presents with cough and shortness of breath.  Vital signs normal on intake.  Cardiopulmonary exam is significant for coarse breath sounds throughout lateral lung fields worse in the left, abdominal exam is benign.  Tenderness noted to the anterior anterior ribs bilaterally.  BLE edema is nonpitting per patient at baseline.  DDx includes limited to viral URI, pneumonia, CHF exacerbation, pneumothorax, PE, pleural effusion.   Amount and/or Complexity of Data Reviewed Labs:  ordered. Radiology: ordered.   ***  {Document critical care time when appropriate  Document review of labs and clinical decision tools ie [id number], etc  Document your independent review of radiology images and any outside records  Document your discussion with family members, caretakers and with consultants  Document social determinants of health affecting pt's care  Document your decision making why or why not admission, treatments were needed:32947:::1}   Final diagnoses:  None    ED Discharge Orders     None

## 2024-07-29 ENCOUNTER — Emergency Department (HOSPITAL_COMMUNITY)

## 2024-07-29 DIAGNOSIS — R051 Acute cough: Secondary | ICD-10-CM | POA: Diagnosis not present

## 2024-07-29 LAB — TROPONIN I (HIGH SENSITIVITY): Troponin I (High Sensitivity): 59 ng/L — ABNORMAL HIGH (ref <18)

## 2024-07-29 LAB — BRAIN NATRIURETIC PEPTIDE: B Natriuretic Peptide: 239.4 pg/mL — ABNORMAL HIGH (ref 0.0–100.0)

## 2024-07-29 MED ORDER — HYDROCODONE-ACETAMINOPHEN 5-325 MG PO TABS: 1.0000 | ORAL_TABLET | Freq: Once | ORAL | Status: DC

## 2024-07-29 MED ORDER — MORPHINE SULFATE (PF) 2 MG/ML IV SOLN
2.0000 mg | Freq: Once | INTRAVENOUS | Status: AC
  Administered 2024-07-29: 2 mg via INTRAVENOUS
  Filled 2024-07-29: qty 1

## 2024-07-29 MED ORDER — SODIUM CHLORIDE 0.9 % IV BOLUS
500.0000 mL | Freq: Once | INTRAVENOUS | Status: AC
  Administered 2024-07-29: 500 mL via INTRAVENOUS

## 2024-07-29 NOTE — ED Notes (Signed)
[facility] called for pt. transportation ?

## 2024-07-29 NOTE — Discharge Instructions (Addendum)
 [name] was seen in the ER today for her cough.  There is no emergent finding on her workup today.  She likely has a viral illness causing her cough; her rib pain is likely muscular strain from her persistent cough.  She may use the cough suppressant medication discussed with her nurse practitioner at the facility, which the facility will prescribe for her.  Return to the ER with any severe symptoms.

## 2024-08-01 ENCOUNTER — Ambulatory Visit

## 2024-08-01 DIAGNOSIS — I442 Atrioventricular block, complete: Secondary | ICD-10-CM | POA: Diagnosis not present

## 2024-08-01 LAB — CUP PACEART REMOTE DEVICE CHECK
Date Time Interrogation Session: 20251009085728
Implantable Lead Connection Status: 753985
Implantable Lead Connection Status: 753985
Implantable Lead Implant Date: 20200624
Implantable Lead Implant Date: 20200624
Implantable Lead Location: 753859
Implantable Lead Location: 753860
Implantable Lead Model: 377
Implantable Lead Model: 377
Implantable Lead Serial Number: 81195510
Implantable Pulse Generator Implant Date: 20200624
Pulse Gen Model: 407145
Pulse Gen Serial Number: 69638782

## 2024-08-02 NOTE — Progress Notes (Signed)
 Remote PPM Transmission

## 2024-08-06 NOTE — Progress Notes (Signed)
 Remote PPM Transmission

## 2024-08-07 ENCOUNTER — Ambulatory Visit: Payer: Self-pay | Admitting: Internal Medicine

## 2024-09-03 ENCOUNTER — Emergency Department (HOSPITAL_COMMUNITY)

## 2024-09-03 ENCOUNTER — Encounter (HOSPITAL_COMMUNITY): Payer: Self-pay

## 2024-09-03 ENCOUNTER — Other Ambulatory Visit: Payer: Self-pay

## 2024-09-03 ENCOUNTER — Inpatient Hospital Stay (HOSPITAL_COMMUNITY)
Admission: EM | Admit: 2024-09-03 | Discharge: 2024-09-12 | DRG: 291 | Disposition: A | Discharge status: Skilled Nursing Facility | Source: Skilled Nursing Facility | Attending: Emergency Medicine | Admitting: Emergency Medicine

## 2024-09-03 DIAGNOSIS — I509 Heart failure, unspecified: Principal | ICD-10-CM

## 2024-09-03 DIAGNOSIS — Z7951 Long term (current) use of inhaled steroids: Secondary | ICD-10-CM

## 2024-09-03 DIAGNOSIS — K5900 Constipation, unspecified: Secondary | ICD-10-CM | POA: Diagnosis present

## 2024-09-03 DIAGNOSIS — D696 Thrombocytopenia, unspecified: Secondary | ICD-10-CM | POA: Diagnosis present

## 2024-09-03 DIAGNOSIS — Z888 Allergy status to other drugs, medicaments and biological substances status: Secondary | ICD-10-CM

## 2024-09-03 DIAGNOSIS — I4821 Permanent atrial fibrillation: Secondary | ICD-10-CM | POA: Diagnosis present

## 2024-09-03 DIAGNOSIS — I340 Nonrheumatic mitral (valve) insufficiency: Secondary | ICD-10-CM | POA: Diagnosis present

## 2024-09-03 DIAGNOSIS — E1122 Type 2 diabetes mellitus with diabetic chronic kidney disease: Secondary | ICD-10-CM | POA: Diagnosis present

## 2024-09-03 DIAGNOSIS — Z1152 Encounter for screening for COVID-19: Secondary | ICD-10-CM

## 2024-09-03 DIAGNOSIS — E66811 Obesity, class 1: Secondary | ICD-10-CM | POA: Diagnosis present

## 2024-09-03 DIAGNOSIS — E1165 Type 2 diabetes mellitus with hyperglycemia: Secondary | ICD-10-CM | POA: Diagnosis present

## 2024-09-03 DIAGNOSIS — N1832 Chronic kidney disease, stage 3b: Secondary | ICD-10-CM | POA: Diagnosis present

## 2024-09-03 DIAGNOSIS — I10 Essential (primary) hypertension: Secondary | ICD-10-CM | POA: Diagnosis present

## 2024-09-03 DIAGNOSIS — N179 Acute kidney failure, unspecified: Secondary | ICD-10-CM | POA: Diagnosis present

## 2024-09-03 DIAGNOSIS — K219 Gastro-esophageal reflux disease without esophagitis: Secondary | ICD-10-CM | POA: Diagnosis present

## 2024-09-03 DIAGNOSIS — Z853 Personal history of malignant neoplasm of breast: Secondary | ICD-10-CM

## 2024-09-03 DIAGNOSIS — R140 Abdominal distension (gaseous): Secondary | ICD-10-CM | POA: Diagnosis present

## 2024-09-03 DIAGNOSIS — I130 Hypertensive heart and chronic kidney disease with heart failure and stage 1 through stage 4 chronic kidney disease, or unspecified chronic kidney disease: Principal | ICD-10-CM | POA: Diagnosis present

## 2024-09-03 DIAGNOSIS — T502X5A Adverse effect of carbonic-anhydrase inhibitors, benzothiadiazides and other diuretics, initial encounter: Secondary | ICD-10-CM | POA: Diagnosis present

## 2024-09-03 DIAGNOSIS — R0602 Shortness of breath: Secondary | ICD-10-CM

## 2024-09-03 DIAGNOSIS — I5043 Acute on chronic combined systolic (congestive) and diastolic (congestive) heart failure: Secondary | ICD-10-CM | POA: Diagnosis present

## 2024-09-03 DIAGNOSIS — I480 Paroxysmal atrial fibrillation: Secondary | ICD-10-CM | POA: Diagnosis present

## 2024-09-03 DIAGNOSIS — I5023 Acute on chronic systolic (congestive) heart failure: Secondary | ICD-10-CM | POA: Diagnosis present

## 2024-09-03 DIAGNOSIS — E1169 Type 2 diabetes mellitus with other specified complication: Secondary | ICD-10-CM

## 2024-09-03 DIAGNOSIS — E1140 Type 2 diabetes mellitus with diabetic neuropathy, unspecified: Secondary | ICD-10-CM | POA: Diagnosis present

## 2024-09-03 DIAGNOSIS — I471 Supraventricular tachycardia, unspecified: Secondary | ICD-10-CM | POA: Diagnosis present

## 2024-09-03 DIAGNOSIS — Z6831 Body mass index (BMI) 31.0-31.9, adult: Secondary | ICD-10-CM

## 2024-09-03 DIAGNOSIS — Z794 Long term (current) use of insulin: Secondary | ICD-10-CM

## 2024-09-03 DIAGNOSIS — Z7901 Long term (current) use of anticoagulants: Secondary | ICD-10-CM

## 2024-09-03 DIAGNOSIS — G4733 Obstructive sleep apnea (adult) (pediatric): Secondary | ICD-10-CM | POA: Diagnosis present

## 2024-09-03 DIAGNOSIS — Z803 Family history of malignant neoplasm of breast: Secondary | ICD-10-CM

## 2024-09-03 DIAGNOSIS — Z823 Family history of stroke: Secondary | ICD-10-CM

## 2024-09-03 DIAGNOSIS — D631 Anemia in chronic kidney disease: Secondary | ICD-10-CM | POA: Diagnosis present

## 2024-09-03 DIAGNOSIS — J40 Bronchitis, not specified as acute or chronic: Secondary | ICD-10-CM | POA: Diagnosis present

## 2024-09-03 DIAGNOSIS — Z950 Presence of cardiac pacemaker: Secondary | ICD-10-CM

## 2024-09-03 DIAGNOSIS — Z880 Allergy status to penicillin: Secondary | ICD-10-CM

## 2024-09-03 DIAGNOSIS — E119 Type 2 diabetes mellitus without complications: Secondary | ICD-10-CM

## 2024-09-03 DIAGNOSIS — I272 Pulmonary hypertension, unspecified: Secondary | ICD-10-CM | POA: Diagnosis present

## 2024-09-03 DIAGNOSIS — Z886 Allergy status to analgesic agent status: Secondary | ICD-10-CM

## 2024-09-03 DIAGNOSIS — I5022 Chronic systolic (congestive) heart failure: Secondary | ICD-10-CM | POA: Diagnosis present

## 2024-09-03 DIAGNOSIS — R079 Chest pain, unspecified: Secondary | ICD-10-CM | POA: Diagnosis present

## 2024-09-03 DIAGNOSIS — Z8249 Family history of ischemic heart disease and other diseases of the circulatory system: Secondary | ICD-10-CM

## 2024-09-03 DIAGNOSIS — Z86718 Personal history of other venous thrombosis and embolism: Secondary | ICD-10-CM

## 2024-09-03 DIAGNOSIS — I5041 Acute combined systolic (congestive) and diastolic (congestive) heart failure: Secondary | ICD-10-CM

## 2024-09-03 DIAGNOSIS — I447 Left bundle-branch block, unspecified: Secondary | ICD-10-CM | POA: Diagnosis present

## 2024-09-03 DIAGNOSIS — Z8673 Personal history of transient ischemic attack (TIA), and cerebral infarction without residual deficits: Secondary | ICD-10-CM

## 2024-09-03 DIAGNOSIS — Z66 Do not resuscitate: Secondary | ICD-10-CM | POA: Diagnosis present

## 2024-09-03 DIAGNOSIS — I4892 Unspecified atrial flutter: Secondary | ICD-10-CM | POA: Diagnosis present

## 2024-09-03 DIAGNOSIS — I429 Cardiomyopathy, unspecified: Secondary | ICD-10-CM | POA: Diagnosis present

## 2024-09-03 DIAGNOSIS — Z79899 Other long term (current) drug therapy: Secondary | ICD-10-CM

## 2024-09-03 LAB — CBC WITH DIFFERENTIAL/PLATELET
Abs Immature Granulocytes: 0.02 K/uL (ref 0.00–0.07)
Basophils Absolute: 0 K/uL (ref 0.0–0.1)
Basophils Relative: 1 %
Eosinophils Absolute: 0.2 K/uL (ref 0.0–0.5)
Eosinophils Relative: 4 %
HCT: 35.9 % — ABNORMAL LOW (ref 36.0–46.0)
Hemoglobin: 10.9 g/dL — ABNORMAL LOW (ref 12.0–15.0)
Immature Granulocytes: 1 %
Lymphocytes Relative: 22 %
Lymphs Abs: 1 K/uL (ref 0.7–4.0)
MCH: 28.1 pg (ref 26.0–34.0)
MCHC: 30.4 g/dL (ref 30.0–36.0)
MCV: 92.5 fL (ref 80.0–100.0)
Monocytes Absolute: 0.5 K/uL (ref 0.1–1.0)
Monocytes Relative: 11 %
Neutro Abs: 2.8 K/uL (ref 1.7–7.7)
Neutrophils Relative %: 61 %
Platelets: 161 K/uL (ref 150–400)
RBC: 3.88 MIL/uL (ref 3.87–5.11)
RDW: 15.1 % (ref 11.5–15.5)
WBC: 4.4 K/uL (ref 4.0–10.5)
nRBC: 0 % (ref 0.0–0.2)

## 2024-09-03 LAB — RESP PANEL BY RT-PCR (RSV, FLU A&B, COVID)  RVPGX2
Influenza A by PCR: NEGATIVE
Influenza B by PCR: NEGATIVE
Resp Syncytial Virus by PCR: NEGATIVE
SARS Coronavirus 2 by RT PCR: NEGATIVE

## 2024-09-03 LAB — BASIC METABOLIC PANEL WITH GFR
Anion gap: 15 (ref 5–15)
BUN: 18 mg/dL (ref 8–23)
CO2: 28 mmol/L (ref 22–32)
Calcium: 8.7 mg/dL — ABNORMAL LOW (ref 8.9–10.3)
Chloride: 100 mmol/L (ref 98–111)
Creatinine, Ser: 1.38 mg/dL — ABNORMAL HIGH (ref 0.44–1.00)
GFR, Estimated: 37 mL/min — ABNORMAL LOW (ref >60)
Glucose, Bld: 267 mg/dL — ABNORMAL HIGH (ref 70–99)
Potassium: 3.9 mmol/L (ref 3.5–5.1)
Sodium: 143 mmol/L (ref 135–145)

## 2024-09-03 LAB — BRAIN NATRIURETIC PEPTIDE: B Natriuretic Peptide: 383.6 pg/mL — ABNORMAL HIGH (ref 0.0–100.0)

## 2024-09-03 MED ORDER — FUROSEMIDE 10 MG/ML IJ SOLN
120.0000 mg | INTRAVENOUS | Status: AC
  Administered 2024-09-03: 120 mg via INTRAVENOUS
  Filled 2024-09-03: qty 10

## 2024-09-03 NOTE — ED Provider Notes (Signed)
 [place] EMERGENCY DEPARTMENT AT [facility] Provider Note   CSN: [id number] Arrival date & time: 09/03/24  1339     Patient presents with: Cough   [name] is a 89 y.o. female.   89 year old female with a history of CHF on torsemide , CKD, DVT on Eliquis , atrial fibrillation and bradycardia status post pacemaker who presents emergency department shortness of breath.  6-week ago the patient got a flu shot.  Patient had URI type symptoms and has been sick ever since.  His had shortness of breath and dry cough.  Over the past few days has also had lower extremity swelling that is worsened.  She says that her shortness of breath is worse at night especially when she lays down to sleep.  Thinks has been getting weight.  It is unclear if she has been getting torsemide  at her facility but is prescribed 60 mg of this.  Has trialed multiple rounds of antibiotics without relief of her symptoms.  Did attempt to contact [name] from her facility without success.  History obtained per patient and her granddaughter       Prior to Admission medications   Medication Sig Start Date End Date Taking? Authorizing Provider  acetaminophen  (TYLENOL ) 325 MG suppository Place 650 mg rectally 3 (three) times daily.    [provider]  amiodarone  (PACERONE ) 200 MG tablet Take one tablet by mouth daily Monday-Friday. Do NOT take Saturday and Sunday 06/21/22   Lesia Ozell Barter, PA-C  amLODipine  (NORVASC ) 5 MG tablet Take 5 mg by mouth daily. 01/26/23   [provider]  carvedilol  (COREG ) 6.25 MG tablet Take 6.25 mg by mouth 2 (two) times daily with a meal.    [provider]  Docusate Sodium  100 MG capsule Take 100 mg by mouth every morning.    [provider]  Dulaglutide 3 MG/0.5ML SOPN Inject 1.5 mg into the skin every 7 (seven) days. tuesday    [provider]  ELIQUIS  2.5 MG TABS tablet TAKE 1 TABLET(2.5 MG) BY MOUTH TWICE DAILY 07/13/20    Dunn, Dayna N, PA-C  escitalopram  (LEXAPRO ) 5 MG tablet Take 5 mg by mouth daily. 03/25/23   [provider]  fluticasone (FLONASE) 50 MCG/ACT nasal spray Place into both nostrils daily.    [provider]  guaifenesin  (HUMIBID E) 400 MG TABS tablet Take 400 mg by mouth. Take 1 1/2 tablets by mouth at bedtime    [provider]  HYDROcodone -acetaminophen  (NORCO/VICODIN) 5-325 MG tablet Take 1 tablet by mouth every 6 (six) hours as needed for moderate pain (pain score 4-6).    [provider]  insulin  glargine (LANTUS ) 100 UNIT/ML injection Inject 8 Units into the skin every morning.    [provider]  insulin  glargine (LANTUS ) 100 unit/mL SOPN Inject 23 Units into the skin at bedtime. 06/05/23   Singh, Prashant K, MD  insulin  lispro (HUMALOG  KWIKPEN) 100 UNIT/ML KwikPen Before each meal 3 times a day, 140-199 - 2 units, 200-250 - 4 units, 251-299 - 6 units,  300-349 - 8 units,  350 or above 10 units. 06/05/23   Dennise Lavada POUR, MD  insulin  lispro (HUMALOG ) 100 UNIT/ML injection Inject 2 Units into the skin 3 (three) times daily before meals. As directed:  150-199=2 units 200    [provider]  ipratropium-albuterol  (DUONEB) 0.5-2.5 (3) MG/3ML SOLN Take 3 mLs by nebulization every 4 (four) hours as needed.    [provider]  lactulose  (CHRONULAC )  10 GM/15ML solution Take by mouth as needed for mild constipation.    [provider]  lidocaine (LIDODERM) 5 % Place 1 patch onto the skin daily. Remove & Discard patch within 12 hours or as directed by MD    [provider]  loperamide  (IMODIUM  A-D) 2 MG tablet Take 2 mg by mouth 4 (four) times daily as needed for diarrhea or loose stools.    [provider]  loratadine (CLARITIN) 10 MG tablet Take 10 mg by mouth daily.    [provider]  losartan  (COZAAR ) 25 MG tablet Take 1 tablet (25 mg total) by mouth daily. 06/07/23 06/06/24  Singh, Prashant K, MD   Menthol, Topical Analgesic, (BIOFREEZE COOL THE PAIN) 4 % GEL Apply topically.    [provider]  mirabegron  ER (MYRBETRIQ ) 25 MG TB24 tablet Take 25 mg by mouth daily.    [provider]  omeprazole  (PRILOSEC OTC) 20 MG tablet Take 20 mg by mouth every morning.    [provider]  ondansetron  (ZOFRAN ) 4 MG tablet Take 4 mg by mouth every 8 (eight) hours as needed for nausea or vomiting.    [provider]  oxybutynin  (DITROPAN ) 5 MG tablet TAKE 1 TABLET(5 MG) BY MOUTH TWICE DAILY Patient taking differently: Take 5 mg by mouth daily. 12/23/19   Jarold Medici, MD  Potassium Chloride  ER 20 MEQ TBCR Take 20 mEq by mouth daily.    [provider]  pregabalin  (LYRICA ) 50 MG capsule Take 50 mg by mouth every morning. 06/10/22   [provider]  pregabalin  (LYRICA ) 75 MG capsule Take 1 capsule (75 mg total) by mouth at bedtime. 06/30/20   Moore, Janece, FNP  senna (SENOKOT) 8.6 MG tablet Take 2 tablets by mouth at bedtime.    [provider]  torsemide  (DEMADEX ) 20 MG tablet Take 40 mg every morning.  Take 20-40 mg daily every afternoon as needed for swelling or shortness of breath 04/10/23   Dann Candyce RAMAN, MD    Allergies: Aspirin, Penicillins, and Remeron  [mirtazapine ]    Review of Systems  Updated Vital Signs BP (!) 156/76   Pulse (!) 59   Temp 98.1 F (36.7 C) (Oral)   Resp 10   Ht 5' 5 (1.651 m)   Wt 89.8 kg   SpO2 97%   BMI 32.95 kg/m   Physical Exam Vitals and nursing note reviewed.  Constitutional:      General: She is not in acute distress.    Appearance: She is well-developed.  HENT:     Head: Normocephalic and atraumatic.     Right Ear: External ear normal.     Left Ear: External ear normal.     Nose: Nose normal.  Eyes:     Extraocular Movements: Extraocular movements intact.     Conjunctiva/sclera: Conjunctivae normal.     Pupils: Pupils are equal, round, and reactive to light.  Cardiovascular:      Rate and Rhythm: Normal rate and regular rhythm.     Heart sounds: No murmur heard. Pulmonary:     Effort: Pulmonary effort is normal. No respiratory distress.     Breath sounds: Rales (Bibasilar) present.  Musculoskeletal:     Cervical back: Normal range of motion and neck supple.     Right lower leg: Edema present.     Left lower leg: Edema present.  Skin:    General: Skin is warm and dry.  Neurological:     Mental Status: She is  alert and oriented to person, place, and time. Mental status is at baseline.  Psychiatric:        Mood and Affect: Mood normal.     (all labs ordered are listed, but only abnormal results are displayed) Labs Reviewed  BRAIN NATRIURETIC PEPTIDE - Abnormal; Notable for the following components:      Result Value   B Natriuretic Peptide 383.6 (*)    All other components within normal limits  CBC WITH DIFFERENTIAL/PLATELET - Abnormal; Notable for the following components:   Hemoglobin 10.9 (*)    HCT 35.9 (*)    All other components within normal limits  BASIC METABOLIC PANEL WITH GFR - Abnormal; Notable for the following components:   Glucose, Bld 267 (*)    Creatinine, Ser 1.38 (*)    Calcium  8.7 (*)    GFR, Estimated 37 (*)    All other components within normal limits  RESP PANEL BY RT-PCR (RSV, FLU A&B, COVID)  [id number]    EKG: EKG Interpretation Date/Time:  Tuesday September 03 2024 21:33:39 EST Ventricular Rate:  66 PR Interval:    QRS Duration:  163 QT Interval:  521 QTC Calculation: 521 R Axis:   -33  Text Interpretation: Atrial flutter Left bundle branch block Confirmed by Yolande Charleston [phone]) on 09/03/2024 9:55:10 PM  Radiology: [facility] Chest 2 View Result Date: 09/03/2024 CLINICAL DATA:  Cough. EXAM: CHEST - 2 VIEW COMPARISON:  Chest radiograph dated 07/28/2024. FINDINGS: Cardiomegaly with vascular congestion. No focal consolidation, pleural effusion or pneumothorax. No acute pathology visualized. New the pathology. Degenerative  changes spine and shoulders. IMPRESSION: Cardiomegaly with vascular congestion. No focal consolidation. Electronically Signed   By: Vanetta Chou M.D.   On: 09/03/2024 15:02     Procedures   Medications Ordered in the ED  furosemide  (LASIX ) 120 mg in dextrose  5 % 50 mL IVPB (has no administration in time range)    Clinical Course as of 09/03/24 2304  Tue Sep 03, 2024  2303 Discussed with Dr. Alfornia for admission [RP]    Clinical Course User Index [RP] Yolande Charleston BROCKS, MD                                 Medical Decision Making Amount and/or Complexity of Data Reviewed Radiology: ordered.  Risk Decision regarding hospitalization.   89 year old female with a history of CHF on torsemide , CKD, DVT on Eliquis , atrial fibrillation and complete heart block status post pacemaker who presents emergency department shortness of breath.  Initial Ddx:  CHF, COPD, URI, pneumonia, PE  MDM/Course:  Patient presents emergency department with shortness of breath.  Is been ongoing for the past 6 weeks.  Also has had a cough and lower extremity swelling.  Has having PND as well.  Is prescribed torsemide  60 mg at her facility but is unclear how often she has been getting this.  Already anticoagulated making PE highly unlikely.  On exam does appear volume overloaded but is satting well on room air and is not in respiratory distress.  EKG with atrial flutter that is rate controlled.  BNP is elevated at 383 and chest x-ray shows cardiomegaly with pulmonary vascular congestion.  COVID and flu negative.  Upon re-evaluation patient was stable.  Suspect she is likely having a heart failure exacerbation.  Somewhat concerned that she is on such a high dose of torsemide  and is still becoming volume overloaded.  Will discuss with hospitalist  for IV diuresis  This patient presents to the ED for concern of complaints listed in HPI, this involves an extensive number of treatment options, and is a complaint  that carries with it a high risk of complications and morbidity. Disposition including potential need for admission considered.   Dispo: Admit to Floor  Additional history obtained from granddaughter Records reviewed Outpatient Clinic Notes The following labs were independently interpreted: Chemistry and show CKD I independently reviewed the following imaging with scope of interpretation limited to determining acute life threatening conditions related to emergency care: Chest x-ray and agree with the radiologist interpretation with the following exceptions: none I personally reviewed and interpreted cardiac monitoring: Atrial flutter rate controlled I personally reviewed and interpreted the pt's EKG: see above for interpretation  I have reviewed the patients home medications and made adjustments as needed Consults: Hospitalist Social Determinants of health:  Geriatric  Portions of this note were generated with Scientist, clinical (histocompatibility and immunogenetics). Dictation errors may occur despite best attempts at proofreading.     Final diagnoses:  Acute on chronic congestive heart failure, unspecified heart failure type [facility])  Shortness of breath    ED Discharge Orders     None          Yolande Lamar BROCKS, MD 09/03/24 2213

## 2024-09-03 NOTE — ED Triage Notes (Signed)
 Pt bib [facility] from skilled facility. She has had bronchitis but treatment has not improved. Doc said to send her here. Lung sounds clear Diabetic  Cbg 371 Bp 160/80 Hr 80 93% RA

## 2024-09-03 NOTE — ED Provider Triage Note (Signed)
 Emergency Medicine Provider Triage Evaluation Note  [name] , a 89 y.o. female with history of CKD diabetes breast cancer and heart failure was evaluated in triage.  Pt complains of shortness of breath.  1 month of shortness of breath.  Treated in skilled nursing facility for bronchitis but no improvement and reported shortness of breath.  Nursing facility physician sent her here.  No fevers chills nausea vomiting or chest pain  Review of Systems  Positive: As above Negative: Of  Physical Exam  BP (!) 166/91 (BP Location: Right Arm)   Pulse 67   Temp 99.3 F (37.4 C)   Resp 18   Ht 5' 5 (1.651 m)   Wt 89.8 kg   SpO2 99%   BMI 32.95 kg/m  Gen:   Awake, no distress   Resp:  Normal effort  MSK:   Moves extremities without difficulty  Other:    Medical Decision Making  Medically screening exam initiated at 2:44 PM.  Appropriate orders placed.  [name] was informed that the remainder of the evaluation will be completed by another provider, this initial triage assessment does not replace that evaluation, and the importance of remaining in the ED until their evaluation is complete.     Pamella Ozell LABOR, DO 09/03/24 1445

## 2024-09-03 NOTE — ED Provider Notes (Signed)
 Provider from facility called,  Bobie Gentry can be reached at [phone]   Shaniquia Brafford K, PA-C 09/03/24 1511    Simon Lavonia SAILOR, MD 09/11/24 [phone]

## 2024-09-04 ENCOUNTER — Inpatient Hospital Stay (HOSPITAL_COMMUNITY)

## 2024-09-04 DIAGNOSIS — E66811 Obesity, class 1: Secondary | ICD-10-CM | POA: Diagnosis present

## 2024-09-04 DIAGNOSIS — I272 Pulmonary hypertension, unspecified: Secondary | ICD-10-CM | POA: Diagnosis present

## 2024-09-04 DIAGNOSIS — N1832 Chronic kidney disease, stage 3b: Secondary | ICD-10-CM | POA: Diagnosis present

## 2024-09-04 DIAGNOSIS — I471 Supraventricular tachycardia, unspecified: Secondary | ICD-10-CM | POA: Diagnosis present

## 2024-09-04 DIAGNOSIS — D631 Anemia in chronic kidney disease: Secondary | ICD-10-CM | POA: Diagnosis present

## 2024-09-04 DIAGNOSIS — I5023 Acute on chronic systolic (congestive) heart failure: Secondary | ICD-10-CM | POA: Diagnosis present

## 2024-09-04 DIAGNOSIS — N179 Acute kidney failure, unspecified: Secondary | ICD-10-CM | POA: Diagnosis present

## 2024-09-04 DIAGNOSIS — Z7901 Long term (current) use of anticoagulants: Secondary | ICD-10-CM | POA: Diagnosis not present

## 2024-09-04 DIAGNOSIS — K219 Gastro-esophageal reflux disease without esophagitis: Secondary | ICD-10-CM | POA: Diagnosis present

## 2024-09-04 DIAGNOSIS — I130 Hypertensive heart and chronic kidney disease with heart failure and stage 1 through stage 4 chronic kidney disease, or unspecified chronic kidney disease: Secondary | ICD-10-CM | POA: Diagnosis present

## 2024-09-04 DIAGNOSIS — G4733 Obstructive sleep apnea (adult) (pediatric): Secondary | ICD-10-CM | POA: Diagnosis present

## 2024-09-04 DIAGNOSIS — I4821 Permanent atrial fibrillation: Secondary | ICD-10-CM | POA: Diagnosis present

## 2024-09-04 DIAGNOSIS — I5022 Chronic systolic (congestive) heart failure: Secondary | ICD-10-CM | POA: Diagnosis present

## 2024-09-04 DIAGNOSIS — I429 Cardiomyopathy, unspecified: Secondary | ICD-10-CM | POA: Diagnosis present

## 2024-09-04 DIAGNOSIS — I5041 Acute combined systolic (congestive) and diastolic (congestive) heart failure: Secondary | ICD-10-CM | POA: Diagnosis not present

## 2024-09-04 DIAGNOSIS — I5043 Acute on chronic combined systolic (congestive) and diastolic (congestive) heart failure: Secondary | ICD-10-CM | POA: Diagnosis present

## 2024-09-04 DIAGNOSIS — I5021 Acute systolic (congestive) heart failure: Secondary | ICD-10-CM

## 2024-09-04 DIAGNOSIS — I447 Left bundle-branch block, unspecified: Secondary | ICD-10-CM | POA: Diagnosis present

## 2024-09-04 DIAGNOSIS — Z66 Do not resuscitate: Secondary | ICD-10-CM | POA: Diagnosis present

## 2024-09-04 DIAGNOSIS — I340 Nonrheumatic mitral (valve) insufficiency: Secondary | ICD-10-CM | POA: Diagnosis present

## 2024-09-04 DIAGNOSIS — E1140 Type 2 diabetes mellitus with diabetic neuropathy, unspecified: Secondary | ICD-10-CM | POA: Diagnosis present

## 2024-09-04 DIAGNOSIS — Z794 Long term (current) use of insulin: Secondary | ICD-10-CM | POA: Diagnosis not present

## 2024-09-04 DIAGNOSIS — Z1152 Encounter for screening for COVID-19: Secondary | ICD-10-CM | POA: Diagnosis not present

## 2024-09-04 DIAGNOSIS — E1165 Type 2 diabetes mellitus with hyperglycemia: Secondary | ICD-10-CM | POA: Diagnosis present

## 2024-09-04 DIAGNOSIS — D696 Thrombocytopenia, unspecified: Secondary | ICD-10-CM | POA: Diagnosis present

## 2024-09-04 DIAGNOSIS — R0602 Shortness of breath: Secondary | ICD-10-CM | POA: Diagnosis present

## 2024-09-04 DIAGNOSIS — E1122 Type 2 diabetes mellitus with diabetic chronic kidney disease: Secondary | ICD-10-CM | POA: Diagnosis present

## 2024-09-04 DIAGNOSIS — J40 Bronchitis, not specified as acute or chronic: Secondary | ICD-10-CM | POA: Diagnosis present

## 2024-09-04 DIAGNOSIS — I4892 Unspecified atrial flutter: Secondary | ICD-10-CM | POA: Diagnosis present

## 2024-09-04 LAB — COMPREHENSIVE METABOLIC PANEL WITH GFR
ALT: 13 U/L (ref 0–44)
AST: 17 U/L (ref 15–41)
Albumin: 3.2 g/dL — ABNORMAL LOW (ref 3.5–5.0)
Alkaline Phosphatase: 63 U/L (ref 38–126)
Anion gap: 15 (ref 5–15)
BUN: 15 mg/dL (ref 8–23)
CO2: 28 mmol/L (ref 22–32)
Calcium: 8.5 mg/dL — ABNORMAL LOW (ref 8.9–10.3)
Chloride: 102 mmol/L (ref 98–111)
Creatinine, Ser: 1.21 mg/dL — ABNORMAL HIGH (ref 0.44–1.00)
GFR, Estimated: 43 mL/min — ABNORMAL LOW (ref >60)
Glucose, Bld: 227 mg/dL — ABNORMAL HIGH (ref 70–99)
Potassium: 3.4 mmol/L — ABNORMAL LOW (ref 3.5–5.1)
Sodium: 145 mmol/L (ref 135–145)
Total Bilirubin: 0.8 mg/dL (ref 0.0–1.2)
Total Protein: 6.6 g/dL (ref 6.5–8.1)

## 2024-09-04 LAB — GLUCOSE, CAPILLARY
Glucose-Capillary: 207 mg/dL — ABNORMAL HIGH (ref 70–99)
Glucose-Capillary: 239 mg/dL — ABNORMAL HIGH (ref 70–99)
Glucose-Capillary: 290 mg/dL — ABNORMAL HIGH (ref 70–99)
Glucose-Capillary: 111 mg/dL — ABNORMAL HIGH (ref 70–99)

## 2024-09-04 LAB — ECHOCARDIOGRAM COMPLETE
Area-P 1/2: 3.46 cm2
Calc EF: 29.2 %
Height: 65 in
MV M vel: 4.67 m/s
MV Peak grad: 87.2 mmHg
MV VTI: 2.28 cm2
Radius: 0.3 cm
S' Lateral: 3.5 cm
Single Plane A2C EF: 30.9 %
Single Plane A4C EF: 29.8 %
Weight: 3168 [oz_av]

## 2024-09-04 LAB — HEMOGLOBIN A1C
Hgb A1c MFr Bld: 9.8 % — ABNORMAL HIGH (ref 4.8–5.6)
Mean Plasma Glucose: 234.56 mg/dL

## 2024-09-04 LAB — CBG MONITORING, ED: Glucose-Capillary: 234 mg/dL — ABNORMAL HIGH (ref 70–99)

## 2024-09-04 LAB — TROPONIN I (HIGH SENSITIVITY): Troponin I (High Sensitivity): 55 ng/L — ABNORMAL HIGH (ref <18)

## 2024-09-04 MED ORDER — ONDANSETRON HCL 4 MG/2ML IJ SOLN
4.0000 mg | Freq: Four times a day (QID) | INTRAMUSCULAR | Status: DC | PRN
  Administered 2024-09-07 – 2024-09-08 (×2): 4 mg via INTRAVENOUS
  Filled 2024-09-04 (×2): qty 2

## 2024-09-04 MED ORDER — POTASSIUM CHLORIDE CRYS ER 20 MEQ PO TBCR
40.0000 meq | EXTENDED_RELEASE_TABLET | Freq: Every day | ORAL | Status: AC
  Administered 2024-09-04 – 2024-09-05 (×2): 40 meq via ORAL
  Filled 2024-09-04 (×2): qty 2

## 2024-09-04 MED ORDER — FUROSEMIDE 10 MG/ML IJ SOLN: 80.0000 mg | Freq: Two times a day (BID) | INTRAMUSCULAR | Status: DC

## 2024-09-04 MED ORDER — FUROSEMIDE 10 MG/ML IJ SOLN
40.0000 mg | Freq: Two times a day (BID) | INTRAMUSCULAR | Status: DC
  Administered 2024-09-04 (×2): 40 mg via INTRAVENOUS
  Filled 2024-09-04 (×2): qty 4

## 2024-09-04 MED ORDER — INSULIN ASPART 100 UNIT/ML IJ SOLN
0.0000 [IU] | Freq: Every day | INTRAMUSCULAR | Status: DC
  Administered 2024-09-04 – 2024-09-07 (×3): 2 [IU] via SUBCUTANEOUS
  Administered 2024-09-09 – 2024-09-10 (×2): 3 [IU] via SUBCUTANEOUS
  Filled 2024-09-04 (×2): qty 2
  Filled 2024-09-04: qty 3
  Filled 2024-09-04 (×2): qty 2
  Filled 2024-09-04: qty 3

## 2024-09-04 MED ORDER — ACETAMINOPHEN 650 MG RE SUPP: 650.0000 mg | Freq: Four times a day (QID) | RECTAL | Status: DC | PRN

## 2024-09-04 MED ORDER — APIXABAN 2.5 MG PO TABS
2.5000 mg | ORAL_TABLET | Freq: Two times a day (BID) | ORAL | Status: DC
  Administered 2024-09-04 – 2024-09-12 (×17): 2.5 mg via ORAL
  Filled 2024-09-04 (×17): qty 1

## 2024-09-04 MED ORDER — INSULIN GLARGINE-YFGN 100 UNIT/ML ~~LOC~~ SOLN
15.0000 [IU] | Freq: Every day | SUBCUTANEOUS | Status: DC
  Administered 2024-09-04 – 2024-09-08 (×5): 15 [IU] via SUBCUTANEOUS
  Filled 2024-09-04 (×6): qty 0.15

## 2024-09-04 MED ORDER — POTASSIUM CHLORIDE CRYS ER 20 MEQ PO TBCR: 40.0000 meq | EXTENDED_RELEASE_TABLET | Freq: Two times a day (BID) | ORAL | Status: DC

## 2024-09-04 MED ORDER — AMIODARONE HCL 200 MG PO TABS
200.0000 mg | ORAL_TABLET | Freq: Every day | ORAL | Status: DC
  Administered 2024-09-04 – 2024-09-06 (×3): 200 mg via ORAL
  Filled 2024-09-04 (×4): qty 1

## 2024-09-04 MED ORDER — INSULIN ASPART 100 UNIT/ML IJ SOLN
0.0000 [IU] | Freq: Three times a day (TID) | INTRAMUSCULAR | Status: DC
  Administered 2024-09-04: 5 [IU] via SUBCUTANEOUS
  Administered 2024-09-04: 8 [IU] via SUBCUTANEOUS
  Administered 2024-09-04: 5 [IU] via SUBCUTANEOUS
  Administered 2024-09-05: 3 [IU] via SUBCUTANEOUS
  Administered 2024-09-05: 11 [IU] via SUBCUTANEOUS
  Administered 2024-09-06: 5 [IU] via SUBCUTANEOUS
  Administered 2024-09-06: 3 [IU] via SUBCUTANEOUS
  Administered 2024-09-07: 5 [IU] via SUBCUTANEOUS
  Administered 2024-09-07: 3 [IU] via SUBCUTANEOUS
  Administered 2024-09-08: 8 [IU] via SUBCUTANEOUS
  Administered 2024-09-08: 5 [IU] via SUBCUTANEOUS
  Administered 2024-09-08: 3 [IU] via SUBCUTANEOUS
  Administered 2024-09-09: 11 [IU] via SUBCUTANEOUS
  Administered 2024-09-09: 5 [IU] via SUBCUTANEOUS
  Administered 2024-09-09: 3 [IU] via SUBCUTANEOUS
  Administered 2024-09-10: 15 [IU] via SUBCUTANEOUS
  Administered 2024-09-10: 2 [IU] via SUBCUTANEOUS
  Administered 2024-09-11 (×2): 3 [IU] via SUBCUTANEOUS
  Administered 2024-09-11: 5 [IU] via SUBCUTANEOUS
  Administered 2024-09-12: 3 [IU] via SUBCUTANEOUS
  Filled 2024-09-04: qty 5
  Filled 2024-09-04: qty 2
  Filled 2024-09-04: qty 11
  Filled 2024-09-04 (×2): qty 5
  Filled 2024-09-04 (×4): qty 3
  Filled 2024-09-04: qty 11
  Filled 2024-09-04: qty 3
  Filled 2024-09-04: qty 5
  Filled 2024-09-04: qty 15
  Filled 2024-09-04: qty 8
  Filled 2024-09-04: qty 3
  Filled 2024-09-04: qty 8
  Filled 2024-09-04 (×2): qty 5
  Filled 2024-09-04: qty 3
  Filled 2024-09-04: qty 5

## 2024-09-04 MED ORDER — ACETAMINOPHEN 325 MG PO TABS
650.0000 mg | ORAL_TABLET | Freq: Four times a day (QID) | ORAL | Status: DC | PRN
  Administered 2024-09-04 – 2024-09-11 (×6): 650 mg via ORAL
  Filled 2024-09-04 (×7): qty 2

## 2024-09-04 NOTE — H&P (Signed)
 History and Physical    [name] FMW:[id number] DOB: Mar 02, 1933 DOA: 09/03/2024  PCP: Pcp, No  Patient coming from: Home  Chief Complaint: Shortness of breath  HPI: [name] is a 89 y.o. female with medical history significant of CHB status post PPM, permanent A-fib on Eliquis , SVT, HFrEF, OSA on 2 L supplemental oxygen at bedtime (BiPAP intolerant per pulmonology note), breast cancer status post antiestrogen therapy with anastrozole  11/28/2018-02/21/2022, CKD stage IIIb, CVA, type 2 diabetes, hypertension, history of DVT, GERD presenting with a chief complaint of shortness of breath.  Patient is reporting worsening shortness of breath, orthopnea, paroxysmal nocturnal dyspnea, and bilateral lower extremity edema for several weeks.  She is not sure what dose of torsemide  they are giving to her at her nursing facility.  Also having intermittent nonexertional right-sided chest pain for several weeks, last episode was a week ago.  Not having active chest pain, pressure, or discomfort at this time.  No other complaints.  States she has urinated multiple times since after receiving IV Lasix  in the ED and reporting improvement of her dyspnea.  ED Course: SBP as high as 170s but remainder vital signs stable.  Not hypoxic. No fever or leukocytosis, hemoglobin 10.9 (at baseline), glucose 267, bicarb 28, creatinine 1.38 (at baseline), BNP 383, COVID/influenza/RSV PCR negative.  Chest x-ray showing cardiomegaly with vascular congestion and no focal consolidation.  Patient was given IV Lasix  120 mg.  Review of Systems:  Review of Systems  All other systems reviewed and are negative.   Past Medical History:  Diagnosis Date   Abnormal echocardiogram    a. possible mass on echo 05/2019 on PPM, b. no evidence of mass / atrial lead vegetation on follow up echo 06/2019   Anemia    Arthritis    Breast cancer (HCC) 10/2018   Invasive ductal carcinoma   Cancer (HCC)    Cataract    Chronic combined systolic  and diastolic CHF (congestive heart failure) (HCC)    a. Previously diastolic but patient AVS from outside hospital listed systolic CHF and cardiomyopathy, records pending.   CKD (chronic kidney disease), stage III (HCC)    Clotting disorder    CVA (cerebral vascular accident) (HCC)    residual right sided weakness and mild dysphagia   Diabetes mellitus (HCC)    Dizziness and giddiness    DVT (deep venous thrombosis) (HCC)    a. on anticoagulation for this.   Essential hypertension    LBBB (left bundle branch block)    Mitral regurgitation    a. Mod MR by echo 2014.   Muscular deconditioning    Obesity    Pacemaker    Pericardial effusion    SVT (supraventricular tachycardia)    a. In 2013 she had an EPS with ablation for SVT which did not eliminate the SVT completely. She also had bradycardia which limited medication. She was placed on amiodarone  by Dr. Waddell.   Thrombocytopenia 11/21/2011    Past Surgical History:  Procedure Laterality Date   BREAST BIOPSY Right 11/19/2018   positive   ESOPHAGOGASTRODUODENOSCOPY (EGD) WITH PROPOFOL  N/A 02/10/2022   Procedure: ESOPHAGOGASTRODUODENOSCOPY (EGD) WITH PROPOFOL ;  Surgeon: Rollin Dover, MD;  Location: [facility] ENDOSCOPY;  Service: Gastroenterology;  Laterality: N/A;   EYE SURGERY     SUPRAVENTRICULAR TACHYCARDIA ABLATION N/A 10/11/2012   Procedure: SUPRAVENTRICULAR TACHYCARDIA ABLATION;  Surgeon: Danelle LELON Waddell, MD;  Location: [facility] CATH LAB;  Service: Cardiovascular;  Laterality: N/A;     reports that she has  never smoked. She has never used smokeless tobacco. She reports that she does not drink alcohol and does not use drugs.  Allergies  Allergen Reactions   Aspirin Itching   Penicillins Itching and Rash    DID THE REACTION INVOLVE: Swelling of the face/tongue/throat, SOB, or low BP? Yes Sudden or severe rash/hives, skin peeling, or the inside of the mouth or nose? No Did it require medical treatment? Yes When did it last happen? More  than 10 years ago If all above answers are NO, may proceed with cephalosporin use.  Tolerated full course of Unasyn  in 2021.   Remeron  [Mirtazapine ] Other (See Comments)    Unknown reaction    Family History  Problem Relation Age of Onset   Stroke Mother    Cancer Father        prostate   Hypertension Daughter    Heart attack Brother        x2   Hypertension Brother    Stroke Sister    Hypertension Sister    Breast cancer Maternal Aunt     Prior to Admission medications   Medication Sig Start Date End Date Taking? Authorizing Provider  acetaminophen  (TYLENOL ) 325 MG suppository Place 650 mg rectally 3 (three) times daily.    [provider]  amiodarone  (PACERONE ) 200 MG tablet Take one tablet by mouth daily Monday-Friday. Do NOT take Saturday and Sunday 06/21/22   Lesia Ozell Barter, PA-C  amLODipine  (NORVASC ) 5 MG tablet Take 5 mg by mouth daily. 01/26/23   [provider]  carvedilol  (COREG ) 6.25 MG tablet Take 6.25 mg by mouth 2 (two) times daily with a meal.    [provider]  Docusate Sodium  100 MG capsule Take 100 mg by mouth every morning.    [provider]  Dulaglutide 3 MG/0.5ML SOPN Inject 1.5 mg into the skin every 7 (seven) days. tuesday    [provider]  ELIQUIS  2.5 MG TABS tablet TAKE 1 TABLET(2.5 MG) BY MOUTH TWICE DAILY 07/13/20   Dunn, Dayna N, PA-C  escitalopram  (LEXAPRO ) 5 MG tablet Take 5 mg by mouth daily. 03/25/23   [provider]  fluticasone (FLONASE) 50 MCG/ACT nasal spray Place into both nostrils daily.    [provider]  guaifenesin  (HUMIBID E) 400 MG TABS tablet Take 400 mg by mouth. Take 1 1/2 tablets by mouth at bedtime    [provider]  HYDROcodone -acetaminophen  (NORCO/VICODIN) 5-325 MG tablet Take 1 tablet by mouth every 6 (six) hours as needed for moderate pain (pain score 4-6).    [provider]  insulin  glargine (LANTUS ) 100 UNIT/ML injection Inject 8 Units  into the skin every morning.    [provider]  insulin  glargine (LANTUS ) 100 unit/mL SOPN Inject 23 Units into the skin at bedtime. 06/05/23   Singh, Prashant K, MD  insulin  lispro (HUMALOG  KWIKPEN) 100 UNIT/ML KwikPen Before each meal 3 times a day, 140-199 - 2 units, 200-250 - 4 units, 251-299 - 6 units,  300-349 - 8 units,  350 or above 10 units. 06/05/23   Dennise Lavada POUR, MD  insulin  lispro (HUMALOG ) 100 UNIT/ML injection Inject 2 Units into the skin 3 (three) times daily before meals. As directed:  150-199=2 units 200    [provider]  ipratropium-albuterol  (DUONEB) 0.5-2.5 (3) MG/3ML SOLN Take 3 mLs by nebulization every 4 (four) hours as needed.    [provider]  lactulose  (CHRONULAC ) 10 GM/15ML solution Take by mouth as needed for mild  constipation.    [provider]  lidocaine (LIDODERM) 5 % Place 1 patch onto the skin daily. Remove & Discard patch within 12 hours or as directed by MD    [provider]  loperamide  (IMODIUM  A-D) 2 MG tablet Take 2 mg by mouth 4 (four) times daily as needed for diarrhea or loose stools.    [provider]  loratadine (CLARITIN) 10 MG tablet Take 10 mg by mouth daily.    [provider]  losartan  (COZAAR ) 25 MG tablet Take 1 tablet (25 mg total) by mouth daily. 06/07/23 06/06/24  Singh, Prashant K, MD  Menthol, Topical Analgesic, (BIOFREEZE COOL THE PAIN) 4 % GEL Apply topically.    [provider]  mirabegron  ER (MYRBETRIQ ) 25 MG TB24 tablet Take 25 mg by mouth daily.    [provider]  omeprazole  (PRILOSEC OTC) 20 MG tablet Take 20 mg by mouth every morning.    [provider]  ondansetron  (ZOFRAN ) 4 MG tablet Take 4 mg by mouth every 8 (eight) hours as needed for nausea or vomiting.    [provider]  oxybutynin  (DITROPAN ) 5 MG tablet TAKE 1 TABLET(5 MG) BY MOUTH TWICE DAILY Patient taking differently: Take 5 mg by mouth daily. 12/23/19   Jarold Medici, MD  Potassium Chloride  ER 20 MEQ TBCR Take 20 mEq by mouth daily.    [provider]  pregabalin  (LYRICA ) 50 MG capsule Take 50 mg by mouth every morning. 06/10/22   [provider]  pregabalin  (LYRICA ) 75 MG capsule Take 1 capsule (75 mg total) by mouth at bedtime. 06/30/20   Moore, Janece, FNP  senna (SENOKOT) 8.6 MG tablet Take 2 tablets by mouth at bedtime.    [provider]  torsemide  (DEMADEX ) 20 MG tablet Take 40 mg every morning.  Take 20-40 mg daily every afternoon as needed for swelling or shortness of breath 04/10/23   Dann Candyce RAMAN, MD    Physical Exam: Vitals:   09/03/24 2230 09/03/24 2300 09/03/24 2330 09/04/24 0015  BP: (!) 151/61 (!) 159/60 (!) 159/82 (!) 151/72  Pulse: 62 62 77 68  Resp:    18  Temp:      TempSrc:      SpO2: 100% 98% 98% 96%  Weight:      Height:        Physical Exam Vitals reviewed.  Constitutional:      General: She is not in acute distress. HENT:     Head: Normocephalic and atraumatic.  Eyes:     Extraocular Movements: Extraocular movements intact.  Cardiovascular:     Rate and Rhythm: Normal rate and regular rhythm.     Heart sounds: Normal heart sounds.  Pulmonary:     Effort: Pulmonary effort is normal. No respiratory distress.     Breath sounds: Rales present.  Abdominal:     General: Bowel sounds are normal.     Palpations: Abdomen is soft.     Tenderness: There is no abdominal tenderness. There is no guarding.  Musculoskeletal:     Cervical back: Normal range of motion.     Right lower leg: Edema present.     Left lower leg: Edema present.  Skin:    General: Skin is warm and dry.  Neurological:     General: No focal deficit present.     Mental Status: She is alert and oriented to person, place, and time.     Labs on Admission: I have personally reviewed  following labs and imaging studies  CBC: Recent Labs  Lab 09/03/24 1633  WBC 4.4  NEUTROABS 2.8  HGB 10.9*  HCT 35.9*  MCV  92.5  PLT 161   Basic Metabolic Panel: Recent Labs  Lab 09/03/24 1633  NA 143  K 3.9  CL 100  CO2 28  GLUCOSE 267*  BUN 18  CREATININE 1.38*  CALCIUM  8.7*   GFR: Estimated Creatinine Clearance: 30.6 mL/min (A) (by C-G formula based on SCr of 1.38 mg/dL (H)). Liver Function Tests: No results for input(s): AST, ALT, ALKPHOS, BILITOT, PROT, ALBUMIN  in the last 168 hours. No results for input(s): LIPASE, AMYLASE in the last 168 hours. No results for input(s): AMMONIA in the last 168 hours. Coagulation Profile: No results for input(s): INR, PROTIME in the last 168 hours. Cardiac Enzymes: No results for input(s): CKTOTAL, CKMB, CKMBINDEX, TROPONINI in the last 168 hours. BNP (last 3 results) No results for input(s): PROBNP in the last 8760 hours. HbA1C: No results for input(s): HGBA1C in the last 72 hours. CBG: No results for input(s): GLUCAP in the last 168 hours. Lipid Profile: No results for input(s): CHOL, HDL, LDLCALC, TRIG, CHOLHDL, LDLDIRECT in the last 72 hours. Thyroid  Function Tests: No results for input(s): TSH, T4TOTAL, FREET4, T3FREE, THYROIDAB in the last 72 hours. Anemia Panel: No results for input(s): VITAMINB12, FOLATE, FERRITIN, TIBC, IRON , RETICCTPCT in the last 72 hours. Urine analysis:    Component Value Date/Time   COLORURINE YELLOW 06/03/2023 0526   APPEARANCEUR CLEAR 06/03/2023 0526   LABSPEC 1.012 06/03/2023 0526   PHURINE 5.0 06/03/2023 0526   GLUCOSEU 50 (A) 06/03/2023 0526   HGBUR NEGATIVE 06/03/2023 0526   BILIRUBINUR NEGATIVE 06/03/2023 0526   BILIRUBINUR negative 11/28/2019 1206   KETONESUR NEGATIVE 06/03/2023 0526   PROTEINUR NEGATIVE 06/03/2023 0526   UROBILINOGEN 0.2 11/28/2019 1206   UROBILINOGEN 0.2 10/12/2012 2037   NITRITE NEGATIVE 06/03/2023 0526   LEUKOCYTESUR TRACE (A) 06/03/2023 0526    Radiological Exams on Admission: DG Chest 2 View Result Date:  09/03/2024 CLINICAL DATA:  Cough. EXAM: CHEST - 2 VIEW COMPARISON:  Chest radiograph dated 07/28/2024. FINDINGS: Cardiomegaly with vascular congestion. No focal consolidation, pleural effusion or pneumothorax. No acute pathology visualized. New the pathology. Degenerative changes spine and shoulders. IMPRESSION: Cardiomegaly with vascular congestion. No focal consolidation. Electronically Signed   By: Vanetta Chou M.D.   On: 09/03/2024 15:02    EKG: Independently reviewed.  Paced rhythm, baseline wander, LBBB is not new.  Assessment and Plan  Acute on chronic HFmrEF Last echo done in March 2024 showing EF 45 to 50%, RV systolic function is moderately reduced, severely elevated pulmonary artery systolic pressure, severe left atrial dilatation, and moderate mitral regurgitation.  Patient is presenting with complaints of progressively worsening shortness of breath, orthopnea, PND, and bilateral lower extremity edema for several weeks.  Unclear what dose of torsemide  she is receiving at her nursing facility.  Appears volume overloaded on exam.  BNP 383.  Chest x-ray showing vascular congestion.  She is not hypoxic.  Patient was given IV Lasix  120 mg in the ED.  Repeat labs in the morning to check renal function and order additional doses of IV diuretic accordingly.  Monitor intake and output, daily weights.  Low-sodium diet with fluid restriction.  Repeat echocardiogram ordered.  Chest pain Appears atypical, right-sided, and nonexertional.  Last episode was a week ago and no longer having any active chest pain pressure, or discomfort at this time.  EKG with paced rhythm  and difficult to read due to significant baseline wander, LBBB does not appear to be new.  Repeat EKG ordered and check troponin.  CKD stage IIIb Creatinine at baseline, monitor renal function.  Chronic anemia Hemoglobin at baseline.  Hypertension: SBP currently in the 140s.  Patient received IV Lasix  in the ED. Poorly controlled  type 2 diabetes: Glucose in the 260s.  Last hemoglobin A1c 10.2 in April 2023, repeat ordered.  Placed on moderate sliding scale insulin  ACHS. Paroxysmal A-fib History of CVA Unable to verify home medications with the patient.  Awaiting pharmacy med rec.  DVT prophylaxis: She is chronically anticoagulated.  Continue Eliquis  after pharmacy med rec is done. Code Status: DNR/DNI (discussed with the patient) Level of care: Progressive Care Unit Admission status: It is my clinical opinion that admission to INPATIENT is reasonable and necessary because of the expectation that this patient will require hospital care that crosses at least 2 midnights to treat this condition based on the medical complexity of the problems presented.  Given the aforementioned information, the predictability of an adverse outcome is felt to be significant.  Editha Ram MD [facility]  If 7PM-7AM, please contact night-coverage [email or web address]  09/04/2024, 12:34 AM

## 2024-09-04 NOTE — Progress Notes (Signed)
 Echocardiogram 2D Echocardiogram has been performed.  [name] 09/04/2024, 10:06 AM

## 2024-09-04 NOTE — Inpatient Diabetes Management (Signed)
 Inpatient Diabetes Program Recommendations  AACE/ADA: New Consensus Statement on Inpatient Glycemic Control (2015)  Target Ranges:  Prepandial:   less than 140 mg/dL      Peak postprandial:   less than 180 mg/dL (1-2 hours)      Critically ill patients:  140 - 180 mg/dL   Lab Results  Component Value Date   GLUCAP 239 (H) 09/04/2024   HGBA1C 9.8 (H) 09/04/2024    Latest Reference Range & Units 09/04/24 03:02 09/04/24 06:33 09/04/24 10:56  Glucose-Capillary 70 - 99 mg/dL 765 (H) 792 (H) 760 (H)  (H): Data is abnormally high   Diabetes history: DM2 Outpatient Diabetes medications:  Lantus  8 units am, 23 units pm Humalog  sliding scale tid Current orders for Inpatient glycemic control: Semglee  15 units daily Novolog  0-15 units tid, 0-5 units hs correction  Inpatient Diabetes Program Recommendations:   Please consider: -Add Novolog  3 units tid meal coverage if postprandial >180  Thank you, Dagoberto E. Kaylena Pacifico, RN, MSN, CNS, CDCES  Diabetes Coordinator Inpatient Glycemic Control Team Team Pager [phone] (8am-5pm) 09/04/2024 12:49 PM

## 2024-09-04 NOTE — Progress Notes (Addendum)
 PROGRESS NOTE    [name]  FMW:[id number] DOB: [birth year] DOA: 09/03/2024 PCP: Pcp, No  89/F SNF resident, bedbound with complete heart block, PPM, permanent A-fib on Eliquis , chronic systolic CHF, OSA on 2 L O2, breast cancer treated with antiestrogen therapy, CKD 3B, CVA, type 2 diabetes mellitus, hypertension, DVT, GERD presented to the ED with worsening dyspnea, orthopnea cough PND and edema.  Allegedly on torsemide  at baseline, reports compliance - In the ER she was hypertensive, hemoglobin 8.9, no leukocytosis, creatinine 1.3, BNP 383, flu COVID and RSV PCR were negative, chest x-ray noted cardiomegaly pulmonary vascular congestion   Subjective: -Feels a little better, breathing is improving  Assessment and Plan:  Acute on chronic HFmrEF Severe pulmonary hypertension, RV failure -Last echo 3/24 noted EF 45-50%, moderately reduced RV, severely elevated PASP, moderate mitral regurgitation -Presenting with worsening dyspnea on exertion, orthopnea and edema -Continue IV Lasix  today, Coreg , add Aldactone -Poor candidate for SGLT2i -Fairly obese, volume status is a little difficult to evaluate -Follow-up repeat echo   Chest pain -Reports having an episode last week, since resolved, LBBB is old, troponin 55, do not suspect ACS  History of CHB, PPM -Follow-up with the EP   CKD stage IIIb Creatinine at baseline, monitor renal function.   Chronic anemia Hemoglobin at baseline.  Poorly controlled type 2 diabetes: Glucose in the 260s.  Last hemoglobin A1c 10.2 in April 2023, - Restart glargine  Paroxysmal A-fib -In sinus rhythm this morning, resume amiodarone  and Eliquis   History of CVA -Continue Eliquis     DVT prophylaxis: Eliquis  Code Status: DNR Family Communication: No family at bedside Disposition Plan: [name] for [facility]-term care when improved  Consultants:    Procedures:   Antimicrobials:    Objective: Vitals:   09/04/24 0500  09/04/24 0542 09/04/24 0853 09/04/24 1057  BP: (!) 135/59 (!) 183/75 (!) 145/68 138/70  Pulse: (!) 59 (!) 59 61 61  Resp: 17 20 18    Temp:  98.2 F (36.8 C) (!) 97.4 F (36.3 C)   TempSrc:  Oral Oral   SpO2: 100% 100% 99% 91%  Weight:      Height:        Intake/Output Summary (Last 24 hours) at 09/04/2024 1234 Last data filed at 09/04/2024 0446 Gross per 24 hour  Intake --  Output 1500 ml  Net -1500 ml   Filed Weights   09/03/24 1346  Weight: 89.8 kg    Examination:  General exam: Appears calm and comfortable, AO x 2, no distress Respiratory system: Decreased breath sounds at the bases, rare basilar rales Cardiovascular system: S1 & S2 heard, RRR.  Abd: nondistended, soft and nontender.Normal bowel sounds heard. Central nervous system: Alert and oriented. No focal neurological deficits. Extremities: 1+ edema Skin: No rashes Psychiatry:  Mood & affect appropriate.     Data Reviewed:   CBC: Recent Labs  Lab 09/03/24 1633  WBC 4.4  NEUTROABS 2.8  HGB 10.9*  HCT 35.9*  MCV 92.5  PLT 161   Basic Metabolic Panel: Recent Labs  Lab 09/03/24 1633 09/04/24 0627  NA 143 145  K 3.9 3.4*  CL 100 102  CO2 28 28  GLUCOSE 267* 227*  BUN 18 15  CREATININE 1.38* 1.21*  CALCIUM  8.7* 8.5*   GFR: Estimated Creatinine Clearance: 34.9 mL/min (A) (by C-G formula based on SCr of 1.21 mg/dL (H)). Liver Function Tests: Recent Labs  Lab 09/04/24 0627  AST 17  ALT 13  ALKPHOS 63  BILITOT 0.8  PROT 6.6  ALBUMIN  3.2*   No results for input(s): LIPASE, AMYLASE in the last 168 hours. No results for input(s): AMMONIA in the last 168 hours. Coagulation Profile: No results for input(s): INR, PROTIME in the last 168 hours. Cardiac Enzymes: No results for input(s): CKTOTAL, CKMB, CKMBINDEX, TROPONINI in the last 168 hours. BNP (last 3 results) No results for input(s): PROBNP in the last 8760 hours. HbA1C: Recent Labs    09/04/24 0305  HGBA1C  9.8*   CBG: Recent Labs  Lab 09/04/24 0302 09/04/24 0633 09/04/24 1056  GLUCAP 234* 207* 239*   Lipid Profile: No results for input(s): CHOL, HDL, LDLCALC, TRIG, CHOLHDL, LDLDIRECT in the last 72 hours. Thyroid  Function Tests: No results for input(s): TSH, T4TOTAL, FREET4, T3FREE, THYROIDAB in the last 72 hours. Anemia Panel: No results for input(s): VITAMINB12, FOLATE, FERRITIN, TIBC, IRON , RETICCTPCT in the last 72 hours. Urine analysis:    Component Value Date/Time   COLORURINE YELLOW 06/03/2023 0526   APPEARANCEUR CLEAR 06/03/2023 0526   LABSPEC 1.012 06/03/2023 0526   PHURINE 5.0 06/03/2023 0526   GLUCOSEU 50 (A) 06/03/2023 0526   HGBUR NEGATIVE 06/03/2023 0526   BILIRUBINUR NEGATIVE 06/03/2023 0526   BILIRUBINUR negative 11/28/2019 1206   KETONESUR NEGATIVE 06/03/2023 0526   PROTEINUR NEGATIVE 06/03/2023 0526   UROBILINOGEN 0.2 11/28/2019 1206   UROBILINOGEN 0.2 10/12/2012 2037   NITRITE NEGATIVE 06/03/2023 0526   LEUKOCYTESUR TRACE (A) 06/03/2023 0526   Sepsis Labs: @LABRCNTIP (procalcitonin:4,lacticidven:4)  ) Recent Results (from the past 240 hours)  Resp panel by RT-PCR (RSV, Flu A&B, Covid) Anterior Nasal Swab     Status: None   Collection Time: 09/03/24  1:50 PM   Specimen: Anterior Nasal Swab  Result Value Ref Range Status   SARS Coronavirus 2 by RT PCR NEGATIVE NEGATIVE Final   Influenza A by PCR NEGATIVE NEGATIVE Final   Influenza B by PCR NEGATIVE NEGATIVE Final    Comment: (NOTE) The Xpert Xpress SARS-CoV-2/FLU/RSV plus assay is intended as an aid in the diagnosis of influenza from Nasopharyngeal swab specimens and should not be used as a sole basis for treatment. Nasal washings and aspirates are unacceptable for Xpert Xpress SARS-CoV-2/FLU/RSV testing.  Fact Sheet for Patients: bloggercourse.com  Fact Sheet for Healthcare Providers: seriousbroker.it  This test  is not yet approved or cleared by the [place]  FDA and has been authorized for detection and/or diagnosis of SARS-CoV-2 by FDA under an Emergency Use Authorization (EUA). This EUA will remain in effect (meaning this test can be used) for the duration of the COVID-19 declaration under Section 564(b)(1) of the Act, 21 U.S.C. section 360bbb-3(b)(1), unless the authorization is terminated or revoked.     Resp Syncytial Virus by PCR NEGATIVE NEGATIVE Final    Comment: (NOTE) Fact Sheet for Patients: bloggercourse.com  Fact Sheet for Healthcare Providers: seriousbroker.it  This test is not yet approved or cleared by the [place]  FDA and has been authorized for detection and/or diagnosis of SARS-CoV-2 by FDA under an Emergency Use Authorization (EUA). This EUA will remain in effect (meaning this test can be used) for the duration of the COVID-19 declaration under Section 564(b)(1) of the Act, 21 U.S.C. section 360bbb-3(b)(1), unless the authorization is terminated or revoked.  Performed at [facility], [address], [place], [place] [address]      Radiology Studies: DG Chest 2 View Result Date: 09/03/2024 CLINICAL DATA:  Cough. EXAM: CHEST - 2 VIEW COMPARISON:  Chest radiograph dated 07/28/2024.  FINDINGS: Cardiomegaly with vascular congestion. No focal consolidation, pleural effusion or pneumothorax. No acute pathology visualized. New the pathology. Degenerative changes spine and shoulders. IMPRESSION: Cardiomegaly with vascular congestion. No focal consolidation. Electronically Signed   By: Vanetta Chou M.D.   On: 09/03/2024 15:02     Scheduled Meds:  amiodarone   200 mg Oral Daily   apixaban   2.5 mg Oral BID   furosemide   40 mg Intravenous BID   insulin  aspart  0-15 Units Subcutaneous TID WC   insulin  aspart  0-5 Units Subcutaneous QHS   insulin  glargine-yfgn  15 Units Subcutaneous Daily   potassium chloride    40 mEq Oral Daily   Continuous Infusions:   LOS: 0 days    Time spent:    Sigurd Pac, MD [facility]   09/04/2024, 12:34 PM

## 2024-09-05 ENCOUNTER — Encounter (HOSPITAL_COMMUNITY): Payer: Self-pay | Admitting: Internal Medicine

## 2024-09-05 DIAGNOSIS — I5041 Acute combined systolic (congestive) and diastolic (congestive) heart failure: Secondary | ICD-10-CM | POA: Diagnosis not present

## 2024-09-05 DIAGNOSIS — Z79899 Other long term (current) drug therapy: Secondary | ICD-10-CM

## 2024-09-05 DIAGNOSIS — I471 Supraventricular tachycardia, unspecified: Secondary | ICD-10-CM

## 2024-09-05 DIAGNOSIS — I429 Cardiomyopathy, unspecified: Secondary | ICD-10-CM

## 2024-09-05 DIAGNOSIS — I4821 Permanent atrial fibrillation: Secondary | ICD-10-CM | POA: Diagnosis not present

## 2024-09-05 DIAGNOSIS — Z7901 Long term (current) use of anticoagulants: Secondary | ICD-10-CM | POA: Diagnosis not present

## 2024-09-05 DIAGNOSIS — Z950 Presence of cardiac pacemaker: Secondary | ICD-10-CM

## 2024-09-05 DIAGNOSIS — I5023 Acute on chronic systolic (congestive) heart failure: Secondary | ICD-10-CM | POA: Diagnosis not present

## 2024-09-05 LAB — GLUCOSE, CAPILLARY
Glucose-Capillary: 176 mg/dL — ABNORMAL HIGH (ref 70–99)
Glucose-Capillary: 320 mg/dL — ABNORMAL HIGH (ref 70–99)
Glucose-Capillary: 78 mg/dL (ref 70–99)
Glucose-Capillary: 223 mg/dL — ABNORMAL HIGH (ref 70–99)

## 2024-09-05 LAB — BASIC METABOLIC PANEL WITH GFR
Anion gap: 13 (ref 5–15)
BUN: 14 mg/dL (ref 8–23)
CO2: 26 mmol/L (ref 22–32)
Calcium: 8.6 mg/dL — ABNORMAL LOW (ref 8.9–10.3)
Chloride: 102 mmol/L (ref 98–111)
Creatinine, Ser: 1.41 mg/dL — ABNORMAL HIGH (ref 0.44–1.00)
GFR, Estimated: 36 mL/min — ABNORMAL LOW (ref >60)
Glucose, Bld: 172 mg/dL — ABNORMAL HIGH (ref 70–99)
Potassium: 3.6 mmol/L (ref 3.5–5.1)
Sodium: 141 mmol/L (ref 135–145)

## 2024-09-05 MED ORDER — INSULIN ASPART 100 UNIT/ML IJ SOLN
3.0000 [IU] | Freq: Three times a day (TID) | INTRAMUSCULAR | Status: DC
  Administered 2024-09-05 – 2024-09-08 (×9): 3 [IU] via SUBCUTANEOUS
  Filled 2024-09-05 (×9): qty 3

## 2024-09-05 MED ORDER — LACTULOSE 10 GM/15ML PO SOLN
20.0000 g | Freq: Two times a day (BID) | ORAL | Status: DC
  Administered 2024-09-05 – 2024-09-07 (×6): 20 g via ORAL
  Filled 2024-09-05 (×7): qty 30

## 2024-09-05 MED ORDER — FUROSEMIDE 10 MG/ML IJ SOLN
60.0000 mg | Freq: Two times a day (BID) | INTRAMUSCULAR | Status: DC
  Administered 2024-09-05 – 2024-09-06 (×3): 60 mg via INTRAVENOUS
  Filled 2024-09-05 (×3): qty 6

## 2024-09-05 MED ORDER — SPIRONOLACTONE 25 MG PO TABS
25.0000 mg | ORAL_TABLET | Freq: Every day | ORAL | Status: DC
  Administered 2024-09-05 – 2024-09-07 (×3): 25 mg via ORAL
  Filled 2024-09-05 (×3): qty 1

## 2024-09-05 NOTE — Plan of Care (Signed)
   Problem: Fluid Volume: Goal: Ability to maintain a balanced intake and output will improve Outcome: Progressing

## 2024-09-05 NOTE — Progress Notes (Signed)
 Heart Failure Nurse Navigator Progress Note  PCP: Pcp, No PCP-Cardiologist: None Admission Diagnosis: Acute on chronic congestive heart failure, shortness of breath.  Ad[facility] from: [name] via [facility]  Presentation:   [name] presented with recent diagnosis of bronchitis, however the treatment has not helped her. Shortness of breath, BLE edema, BP 156/76, HR 59, BNP 383, Troponin 55, CBG 371. CXR with cardiomegaly with pulmonary vascular congestion.   Patient a very pleasant 89 yr old was educated on the sign and symptoms of heart failure, daily weights, when to call her doctor or go to the ED. Diet/ fluid restrictions, taking all medications as prescribed and attending all medical appointments. She verbalized her understanding of all education. A HF TOC appointment was scheduled for 09/11/2024 @ 10:30 am per Stephanie Mosses, NP.   ECHO/ LVEF: 30-35%  Clinical Course:  Past Medical History:  Diagnosis Date   Abnormal echocardiogram    a. possible mass on echo 05/2019 on PPM, b. 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 Previously diastolic but patient AVS from outside hospital listed systolic CHF and cardiomyopathy, records pending.   CKD (chronic kidney disease), stage III (HCC)    Clotting disorder    CVA (cerebral vascular accident) (HCC)    residual right sided weakness and mild dysphagia   Diabetes mellitus (HCC)    Dizziness and giddiness    DVT (deep venous thrombosis) (HCC)    a. on anticoagulation for this.   Essential hypertension    LBBB (left bundle branch block)    Mitral regurgitation    a. Mod MR by echo 2014.   Muscular deconditioning    Obesity    Pacemaker    Pericardial effusion    SVT (supraventricular tachycardia)    a. In  2013 she had an EPS with ablation for SVT which did not eliminate the SVT completely. She also had bradycardia which limited medication. She was placed on amiodarone  by Dr. Waddell.   Thrombocytopenia 11/21/2011     Social History   Socioeconomic History   Marital status: Widowed    Spouse name: Not on file   Number of children: Not on file   Years of education: Not on file   Highest education level: Not on file  Occupational History   Occupation: retired  Tobacco Use   Smoking status: Never   Smokeless tobacco: Never  Vaping Use   Vaping status: Never Used  Substance and Sexual Activity   Alcohol use: No   Drug use: No   Sexual activity: Not Currently  Other Topics Concern   Not on file  Social History Narrative   Lives at home with grandchild, daughter comes and help    Social Drivers of Health   Financial Resource Strain: Medium Risk (08/01/2020)   Overall Financial Resource Strain (CARDIA)    Difficulty of Paying Living Expenses: Somewhat hard  Food Insecurity: No Food Insecurity (09/04/2024)   Hunger Vital Sign    Worried About Running Out of Food in the Last Year: Never true    Ran Out of Food in the Last Year: Never true  Transportation Needs: No Transportation Needs (09/04/2024)   PRAPARE - Transportation    Lack  of Transportation (Medical): No    Lack of Transportation (Non-Medical): No  Physical Activity: Inactive (04/02/2020)   Exercise Vital Sign    Days of Exercise per Week: 0 days    Minutes of Exercise per Session: 0 min  Stress: No Stress Concern Present (04/02/2020)   [name] - Occupational Stress Questionnaire    Feeling of Stress : Only a little  Social Connections: Moderately Integrated (09/04/2024)   Social Connection and Isolation Panel    Frequency of Communication with Friends and Family: Three times a week    Frequency of Social Gatherings with Friends and Family: Three times a week    Attends Religious Services:  1 to 4 times per year    Active Member of Clubs or Organizations: No    Attends Banker Meetings: 1 to 4 times per year    Marital Status: Widowed   Education Assessment and Provision:  Detailed education and instructions provided on heart failure disease management including the following:  Signs and symptoms of Heart Failure When to call the physician Importance of daily weights Low sodium diet Fluid restriction Medication management Anticipated future follow-up appointments  Patient education given on each of the above topics.  Patient acknowledges understanding via teach back method and acceptance of all instructions.  Education Materials:  Living Better With Heart Failure Booklet, HF zone tool, & Daily Weight Tracker Tool.  Patient has scale at home: Yes at SNF  Patient has pill box at home: SNF brings her daily meds    High Risk Criteria for Readmission and/or Poor Patient Outcomes: Heart failure hospital admissions (last 6 months): 1  No Show rate: 8 % Difficult social situation: No, lives at [facility]  Demonstrates medication adherence: yes Primary Language: English Literacy level: Reading, writing, comprehension  Barriers of Care:   Diet/ fluid restrictions ( at SNF patient states her food that is cooked is usually very salty)  Daily weights, per patient she usually only gets weighted on Monday's.   Considerations/Referrals:   Referral made to Heart Failure Pharmacist [name]: Yes Referral made to Heart Failure CSW/NCM TOC: NA Referral made to Heart & Vascular TOC clinic: Yes, per Stephanie Mosses, NP on 09/11/2024 @ 10:30 am.   Items for Follow-up on DC/TOC: Continued HF education Diet/ fluid restrictions/ daily weights.    [name], BSN, Scientist, Clinical (histocompatibility And Immunogenetics) Only

## 2024-09-05 NOTE — Progress Notes (Addendum)
 PROGRESS NOTE    [name]  FMW:[id number] DOB: 1933-06-06 DOA: 09/03/2024 PCP: Pcp, No  89/F SNF resident, with complete heart block, PPM, permanent A-fib on Eliquis , chronic combined CHF, OSA on 2 L O2, breast cancer treated with antiestrogen therapy, CKD 3B, CVA, type 2 diabetes mellitus, hypertension, DVT, GERD presented to the ED with worsening dyspnea, orthopnea cough PND and edema.  Allegedly on torsemide  at baseline, reports compliance - In the ER she was hypertensive, hemoglobin 8.9, no leukocytosis, creatinine 1.3, BNP 383, flu COVID and RSV PCR were negative, chest x-ray noted cardiomegaly pulmonary vascular congestion   Subjective: - A little better, still with some orthopnea overnight, complains of constipation as well  Assessment and Plan:  Acute on chronic HFmrEF Pulmonary hypertension, RV failure -Last echo 3/24 noted EF 45-50%, moderately reduced RV, severely elevated PASP, moderate mitral regurgitation -Presenting with worsening dyspnea on exertion, orthopnea and edema -Repeat echo with a EF down to 30-35%, moderate LVH, grade 3 DD, mildly reduced RV, moderately elevated PASP, will request cards eval - Continue IV Lasix  1 more day, add Aldactone -Poor candidate for SGLT2i -Fairly obese, volume status is a little difficult to evaluate - PT OT eval  Recent Chest pain -Reports having an episode last week, since resolved, troponin 55, do not suspect ACS  History of CHB, PPM -Follow-up with the EP   CKD stage IIIb Creatinine at baseline, monitor renal function.   Chronic anemia Hemoglobin at baseline.  Poorly controlled type 2 diabetes: Glucose in the 260s.  Last hemoglobin A1c 10.2 in April 2023, - Continue glargine, add meal coverage  Paroxysmal A-fib -continue amiodarone  and Eliquis   History of CVA -Continue Eliquis     DVT prophylaxis: Eliquis  Code Status: DNR Family Communication: No family at bedside Disposition Plan: Back to [name]  for long-term care when improved  Consultants:    Procedures:   Antimicrobials:    Objective: Vitals:   09/04/24 2007 09/04/24 2341 09/05/24 0407 09/05/24 0859  BP: (!) 145/74 (!) 146/65 137/65 (!) 142/84  Pulse: 60 63 67 68  Resp: 20 20 20 18   Temp: 98.1 F (36.7 C) 98.6 F (37 C) 98.2 F (36.8 C) (!) 97.4 F (36.3 C)  TempSrc: Oral Oral Oral Oral  SpO2: 99% 100% 98% 100%  Weight:      Height:        Intake/Output Summary (Last 24 hours) at 09/05/2024 0914 Last data filed at 09/04/2024 1505 Gross per 24 hour  Intake --  Output 500 ml  Net -500 ml   Filed Weights   09/03/24 1346  Weight: 89.8 kg    Examination:  General exam: Appears calm and comfortable, AO x 2, no distress HEENT: Positive JVD Respiratory system: Decreased breath sounds at the bases, rare basilar rales Cardiovascular system: S1 & S2 heard, RRR.  Abd: nondistended, soft and nontender.Normal bowel sounds heard. Central nervous system: Alert and oriented. No focal neurological deficits. Extremities: 1+ edema Skin: No rashes Psychiatry:  Mood & affect appropriate.     Data Reviewed:   CBC: Recent Labs  Lab 09/03/24 1633  WBC 4.4  NEUTROABS 2.8  HGB 10.9*  HCT 35.9*  MCV 92.5  PLT 161   Basic Metabolic Panel: Recent Labs  Lab 09/03/24 1633 09/04/24 0627 09/05/24 0328  NA 143 145 141  K 3.9 3.4* 3.6  CL 100 102 102  CO2 28 28 26   GLUCOSE 267* 227* 172*  BUN 18 15 14   CREATININE 1.38* 1.21* 1.41*  CALCIUM  8.7* 8.5* 8.6*   GFR: Estimated Creatinine Clearance: 29.9 mL/min (A) (by C-G formula based on SCr of 1.41 mg/dL (H)). Liver Function Tests: Recent Labs  Lab 09/04/24 0627  AST 17  ALT 13  ALKPHOS 63  BILITOT 0.8  PROT 6.6  ALBUMIN  3.2*   No results for input(s): LIPASE, AMYLASE in the last 168 hours. No results for input(s): AMMONIA in the last 168 hours. Coagulation Profile: No results for input(s): INR, PROTIME in the last 168 hours. Cardiac  Enzymes: No results for input(s): CKTOTAL, CKMB, CKMBINDEX, TROPONINI in the last 168 hours. BNP (last 3 results) No results for input(s): PROBNP in the last 8760 hours. HbA1C: Recent Labs    09/04/24 0305  HGBA1C 9.8*   CBG: Recent Labs  Lab 09/04/24 0633 09/04/24 1056 09/04/24 1632 09/04/24 2112 09/05/24 0618  GLUCAP 207* 239* 290* 111* 176*   Lipid Profile: No results for input(s): CHOL, HDL, LDLCALC, TRIG, CHOLHDL, LDLDIRECT in the last 72 hours. Thyroid  Function Tests: No results for input(s): TSH, T4TOTAL, FREET4, T3FREE, THYROIDAB in the last 72 hours. Anemia Panel: No results for input(s): VITAMINB12, FOLATE, FERRITIN, TIBC, IRON , RETICCTPCT in the last 72 hours. Urine analysis:    Component Value Date/Time   COLORURINE YELLOW 06/03/2023 0526   APPEARANCEUR CLEAR 06/03/2023 0526   LABSPEC 1.012 06/03/2023 0526   PHURINE 5.0 06/03/2023 0526   GLUCOSEU 50 (A) 06/03/2023 0526   HGBUR NEGATIVE 06/03/2023 0526   BILIRUBINUR NEGATIVE 06/03/2023 0526   BILIRUBINUR negative 11/28/2019 1206   KETONESUR NEGATIVE 06/03/2023 0526   PROTEINUR NEGATIVE 06/03/2023 0526   UROBILINOGEN 0.2 11/28/2019 1206   UROBILINOGEN 0.2 10/12/2012 2037   NITRITE NEGATIVE 06/03/2023 0526   LEUKOCYTESUR TRACE (A) 06/03/2023 0526   Sepsis Labs: @LABRCNTIP (procalcitonin:4,lacticidven:4)  ) Recent Results (from the past 240 hours)  Resp panel by RT-PCR (RSV, Flu A&B, Covid) Anterior Nasal Swab     Status: None   Collection Time: 09/03/24  1:50 PM   Specimen: Anterior Nasal Swab  Result Value Ref Range Status   SARS Coronavirus 2 by RT PCR NEGATIVE NEGATIVE Final   Influenza A by PCR NEGATIVE NEGATIVE Final   Influenza B by PCR NEGATIVE NEGATIVE Final    Comment: (NOTE) The Xpert Xpress SARS-CoV-2/FLU/RSV plus assay is intended as an aid in the diagnosis of influenza from Nasopharyngeal swab specimens and should not be used as a sole basis  for treatment. Nasal washings and aspirates are unacceptable for Xpert Xpress SARS-CoV-2/FLU/RSV testing.  Fact Sheet for Patients: bloggercourse.com  Fact Sheet for Healthcare Providers: seriousbroker.it  This test is not yet approved or cleared by the [place]  FDA and has been authorized for detection and/or diagnosis of SARS-CoV-2 by FDA under an Emergency Use Authorization (EUA). This EUA will remain in effect (meaning this test can be used) for the duration of the COVID-19 declaration under Section 564(b)(1) of the Act, 21 U.S.C. section 360bbb-3(b)(1), unless the authorization is terminated or revoked.     Resp Syncytial Virus by PCR NEGATIVE NEGATIVE Final    Comment: (NOTE) Fact Sheet for Patients: bloggercourse.com  Fact Sheet for Healthcare Providers: seriousbroker.it  This test is not yet approved or cleared by the [place]  FDA and has been authorized for detection and/or diagnosis of SARS-CoV-2 by FDA under an Emergency Use Authorization (EUA). This EUA will remain in effect (meaning this test can be used) for the duration of the COVID-19 declaration under Section 564(b)(1) of the Act, 21 U.S.C. section 360bbb-3(b)(1), unless the  authorization is terminated or revoked.  Performed at [facility], [address] [place], [place] [address]      Radiology Studies: ECHOCARDIOGRAM COMPLETE Result Date: 09/04/2024    ECHOCARDIOGRAM REPORT   Patient Name:   [name] Date of Exam: 09/04/2024 Medical Rec #:  [id number]    Height:       65.0 in Accession #:    [id number]   Weight:       198.0 lb Date of Birth:  11/20/1932    BSA:          1.970 m Patient Age:    89 years     BP:           145/68 mmHg Patient Gender: F            HR:           60 bpm. Exam Location:  Inpatient Procedure: 2D Echo, Cardiac Doppler and Color Doppler (Both Spectral and Color             Flow Doppler were utilized during procedure). Indications:    CHF-acute systolic 150.21  History:        Patient has prior history of Echocardiogram examinations, most                 recent 12/29/2022. CHF, Pacemaker, Arrythmias:Atrial Fibrillation,                 Signs/Symptoms:Chest Pain and Syncope; Risk Factors:Hypertension                 and Diabetes. History of breast cancer.  Sonographer:    Merlynn Argyle Referring Phys: [id number] VASUNDHRA RATHORE IMPRESSIONS  1. Left ventricular ejection fraction, by estimation, is 30 to 35%. The left ventricle has moderately decreased function. The left ventricle has no regional wall motion abnormalities. There is moderate concentric left ventricular hypertrophy. Left ventricular diastolic parameters are consistent with Grade III diastolic dysfunction (restrictive).  2. Right ventricular systolic function is mildly reduced. The right ventricular size is normal. There is moderately elevated pulmonary artery systolic pressure. The estimated right ventricular systolic pressure is 49.6 mmHg.  3. Left atrial size was severely dilated.  4. A small pericardial effusion is present.  5. The mitral valve is normal in structure. Mild mitral valve regurgitation. No evidence of mitral stenosis.  6. The aortic valve is tricuspid. Aortic valve regurgitation is not visualized. No aortic stenosis is present.  7. The inferior vena cava is dilated in size with <50% respiratory variability, suggesting right atrial pressure of 15 mmHg. Comparison(s): Changes from prior study are noted. LV function worse than prior. FINDINGS  Left Ventricle: Left ventricular ejection fraction, by estimation, is 30 to 35%. The left ventricle has moderately decreased function. The left ventricle has no regional wall motion abnormalities. The left ventricular internal cavity size was normal in size. There is moderate concentric left ventricular hypertrophy. Left ventricular diastolic parameters are consistent  with Grade III diastolic dysfunction (restrictive). Right Ventricle: The right ventricular size is normal. No increase in right ventricular wall thickness. Right ventricular systolic function is mildly reduced. There is moderately elevated pulmonary artery systolic pressure. The tricuspid regurgitant velocity is 2.94 m/s, and with an assumed right atrial pressure of 15 mmHg, the estimated right ventricular systolic pressure is 49.6 mmHg. Left Atrium: Left atrial size was severely dilated. Right Atrium: Right atrial size was normal in size. Pericardium: A small pericardial effusion is present. Mitral Valve: The mitral valve  is normal in structure. Mild mitral valve regurgitation. No evidence of mitral valve stenosis. MV peak gradient, 3.3 mmHg. The mean mitral valve gradient is 1.0 mmHg. Tricuspid Valve: The tricuspid valve is normal in structure. Tricuspid valve regurgitation is mild . No evidence of tricuspid stenosis. Aortic Valve: The aortic valve is tricuspid. Aortic valve regurgitation is not visualized. No aortic stenosis is present. Pulmonic Valve: The pulmonic valve was normal in structure. Pulmonic valve regurgitation is mild. No evidence of pulmonic stenosis. Aorta: The aortic root is normal in size and structure. Venous: The inferior vena cava is dilated in size with less than 50% respiratory variability, suggesting right atrial pressure of 15 mmHg. IAS/Shunts: The interatrial septum is aneurysmal. No atrial level shunt detected by color flow Doppler.  LEFT VENTRICLE PLAX 2D LVIDd:         4.40 cm     Diastology LVIDs:         3.50 cm     LV e' medial:    2.81 cm/s LV PW:         1.30 cm     LV E/e' medial:  29.9 LV IVS:        1.45 cm     LV e' lateral:   8.56 cm/s LVOT diam:     2.40 cm     LV E/e' lateral: 9.8 LV SV:         63 LV SV Index:   32 LVOT Area:     4.52 cm LV IVRT:       120 msec  LV Volumes (MOD) LV vol d, MOD A2C: 90.0 ml LV vol d, MOD A4C: 80.3 ml LV vol s, MOD A2C: 62.2 ml LV vol s,  MOD A4C: 56.4 ml LV SV MOD A2C:     27.8 ml LV SV MOD A4C:     80.3 ml LV SV MOD BP:      25.7 ml RIGHT VENTRICLE            IVC RV Basal diam:  3.80 cm    IVC diam: 2.30 cm RV Mid diam:    3.10 cm RV S prime:     9.55 cm/s TAPSE (M-mode): 1.0 cm LEFT ATRIUM              Index        RIGHT ATRIUM           Index LA diam:        5.30 cm  2.69 cm/m   RA Area:     20.70 cm LA Vol (A2C):   149.0 ml 75.65 ml/m  RA Volume:   52.20 ml  26.50 ml/m LA Vol (A4C):   130.0 ml 66.00 ml/m LA Biplane Vol: 139.0 ml 70.57 ml/m  AORTIC VALVE             PULMONIC VALVE LVOT Vmax:   71.00 cm/s  PR End Diast Vel: 6.97 msec LVOT Vmean:  45.100 cm/s LVOT VTI:    0.139 m  AORTA Ao Root diam: 3.20 cm Ao Asc diam:  3.30 cm MITRAL VALVE                  TRICUSPID VALVE MV Area (PHT): 3.46 cm       TR Peak grad:   34.6 mmHg MV Area VTI:   2.28 cm       TR Vmax:        294.00 cm/s MV Peak grad:  3.3 mmHg  MV Mean grad:  1.0 mmHg       SHUNTS MV Vmax:       0.90 m/s       Systemic VTI:  0.14 m MV Vmean:      55.0 cm/s      Systemic Diam: 2.40 cm MV Decel Time: 219 msec MR Peak grad:    87.2 mmHg MR Mean grad:    58.5 mmHg MR Vmax:         467.00 cm/s MR Vmean:        359.5 cm/s MR PISA:         0.57 cm MR PISA Eff ROA: 4 mm MR PISA Radius:  0.30 cm MV E velocity: 84.00 cm/s MV A velocity: 36.00 cm/s MV E/A ratio:  2.33 Morene Brownie Electronically signed by Morene Brownie Signature Date/Time: 09/04/2024/4:42:07 PM    Final    DG Chest 2 View Result Date: 09/03/2024 CLINICAL DATA:  Cough. EXAM: CHEST - 2 VIEW COMPARISON:  Chest radiograph dated 07/28/2024. FINDINGS: Cardiomegaly with vascular congestion. No focal consolidation, pleural effusion or pneumothorax. No acute pathology visualized. New the pathology. Degenerative changes spine and shoulders. IMPRESSION: Cardiomegaly with vascular congestion. No focal consolidation. Electronically Signed   By: Vanetta Chou M.D.   On: 09/03/2024 15:02     Scheduled Meds:   amiodarone   200 mg Oral Daily   apixaban   2.5 mg Oral BID   furosemide   60 mg Intravenous BID   insulin  aspart  0-15 Units Subcutaneous TID WC   insulin  aspart  0-5 Units Subcutaneous QHS   insulin  aspart  3 Units Subcutaneous TID WC   insulin  glargine-yfgn  15 Units Subcutaneous Daily   lactulose   20 g Oral BID   Continuous Infusions:   LOS: 1 day    Time spent:    Sigurd Pac, MD [facility]   09/05/2024, 9:14 AM

## 2024-09-05 NOTE — TOC Initial Note (Signed)
 Transition of Care [facility]) - Initial/Assessment Note    Patient Details  Name: [name] MRN: [id number] Date of Birth: 10/02/1933  Transition of Care [facility]) CM/SW Contact:    Montie LOISE Louder, LCSW Phone Number: 09/05/2024, 4:46 PM  Clinical Narrative:                  CSW met with patient at bedside. CSW introduced self and explained role. Patient confirmed she arrived form [name] and is expected to return once stable.    TOC will continue to follow and assist with discharge planning.  Montie Louder, MSW, LCSW Clinical Social Worker    Expected Discharge Plan: Skilled Nursing Facility Barriers to Discharge: Continued Medical Work up   Patient Goals and CMS Choice            Expected Discharge Plan and Services In-house Referral: Clinical Social Work     Living arrangements for the past 2 months: Skilled Nursing Facility                                      Prior Living Arrangements/Services Living arrangements for the past 2 months: Skilled Nursing Facility Lives with:: Self, Facility Resident                   Activities of Daily Living   ADL Screening (condition at time of admission) Independently performs ADLs?: No Does the patient have a NEW difficulty with bathing/dressing/toileting/self-feeding that is expected to last >3 days?: Yes (Initiates electronic notice to provider for possible OT consult) Does the patient have a NEW difficulty with getting in/out of bed, walking, or climbing stairs that is expected to last >3 days?: Yes (Initiates electronic notice to provider for possible PT consult) Does the patient have a NEW difficulty with communication that is expected to last >3 days?: No Is the patient deaf or have difficulty hearing?: No Does the patient have difficulty seeing, even when wearing glasses/contacts?: No Does the patient have difficulty concentrating, remembering, or making decisions?: No  Permission  Sought/Granted                  Emotional Assessment       Orientation: : Oriented to Self, Oriented to Place, Oriented to  Time, Oriented to Situation Alcohol / Substance Use: Not Applicable Psych Involvement: No (comment)  Admission diagnosis:  Shortness of breath [R06.02] Acute on chronic HFrEF (heart failure with reduced ejection fraction) (HCC) [I50.23] Acute on chronic congestive heart failure, unspecified heart failure type (HCC) [I50.9] Patient Active Problem List   Diagnosis Date Noted   Cardiomyopathy (HCC) 09/05/2024   Permanent atrial fibrillation (HCC) 09/05/2024   Long term (current) use of anticoagulants 09/05/2024   Long term current use of antiarrhythmic drug 09/05/2024   Cardiac pacemaker in situ 09/05/2024   Acute combined systolic and diastolic CHF, NYHA class 3 (HCC) 09/05/2024   Acute on chronic HFrEF (heart failure with reduced ejection fraction) (HCC) 09/04/2024   OSA (obstructive sleep apnea) 07/06/2023   Slurred speech and right facial twitching 06/03/2023   Acute renal failure superimposed on stage 3b chronic kidney disease (HCC) 06/03/2023   Paroxysmal atrial fibrillation (HCC) 06/03/2023   Combined congestive systolic and diastolic heart failure (HCC) 06/03/2023   Pressure injury of skin 02/10/2022   Melena 02/08/2022   Chest pain 02/08/2022   Diabetic neuropathy (HCC) 12/10/2021   Diabetic nephropathy (HCC) 12/10/2021  Hyperglycemia due to type 2 diabetes mellitus (HCC) 12/10/2021   Pure hypercholesterolemia 12/10/2021   Osteoarthritis of knee 12/10/2021   Complete heart block (HCC) 05/14/2021   Acute on chronic combined systolic and diastolic CHF (congestive heart failure) (HCC) 10/08/2020   GERD (gastroesophageal reflux disease) 10/08/2020   Pacemaker 05/06/2020   Syncope 08/10/2019   Displaced fracture of lateral malleolus of right fibula, initial encounter for closed fracture 07/10/2019   Pain in left shoulder 07/10/2019   Personal  history of breast cancer 04/22/2019   Malignant neoplasm of lower-inner quadrant of right breast of female, estrogen receptor positive (HCC) 11/23/2018   Hypertensive heart disease with heart failure (HCC) 09/05/2017   Aphasia 08/31/2017   CKD (chronic kidney disease), stage III (HCC)    LBBB (left bundle branch block)    Other pancytopenia (HCC)    HTN (hypertension)    Obesity    hx of DVT    DM (diabetes mellitus) (HCC) 07/28/2015   Fall 07/28/2015   Warfarin-induced coagulopathy 07/28/2015   Acute on chronic diastolic CHF (congestive heart failure) (HCC) 03/19/2014   SVT (supraventricular tachycardia) (HCC) 10/15/2012   First degree AV block 10/15/2012   PCP:  Pcp, No Pharmacy:   [facility] [place], [place] - [address] [facility] [address] [place] [address] Phone: [phone] Fax: [phone]     Social Drivers of Health (SDOH) Social History: SDOH Screenings   Food Insecurity: No Food Insecurity (09/04/2024)  Housing: Unknown (09/04/2024)  Recent Concern: Housing - High Risk (09/04/2024)  Transportation Needs: No Transportation Needs (09/04/2024)  Utilities: Not At Risk (09/04/2024)  Alcohol Screen: Low Risk  (09/05/2024)  Depression (PHQ2-9): Medium Risk (04/02/2020)  Financial Resource Strain: Low Risk  (09/05/2024)  Physical Activity: Inactive (04/02/2020)  Social Connections: Moderately Integrated (09/04/2024)  Stress: No Stress Concern Present (04/02/2020)  Tobacco Use: Low Risk  (09/03/2024)   SDOH Interventions: Alcohol Usage Interventions: Intervention Not Indicated (Score <7) Financial Strain Interventions: Intervention Not Indicated   Readmission Risk Interventions     No data to display

## 2024-09-05 NOTE — Consult Note (Addendum)
 Cardiology Consultation  Patient ID: [name] MRN: [id number]; DOB: 1933-06-13  Admit date: 09/03/2024 Date of Consult: 09/05/2024  PCP:  [name]   [facility] Providers Cardiologist:  Stephanie Parchment, MD  Electrophysiologist:  Stephanie Birmingham, MD   Patient Profile: [name] is a 89 y.o. female with a hx of permanent A-fib, SVT, complete heart block s/p PPM, known LBBB, chronic combined CHF, history of DVT on Eliquis , hypertension, CKD stage 3b, chronic anemia/thrombocytopenia, diabetes,  who is being seen 09/05/2024 for the evaluation of acute on chronic CHF at the request of [name].  History of Present Illness: [name] has past medical history as stated above. She presented to the [facility] ED on 09/03/2024 with a cough, shortness of breath. She reported that she received her flu shot about 6 weeks prior and has been having URI symptoms ever since. She then noted over the last few days she began having progressively worsening LE edema and orthopnea.   She is currently living at [facility] where she reports that she is typically only weighed on Monday mornings and says that the food they cook at the facility is typically very salty.   Relevant workup since while in the ED/since admission includes: negative respiratory panel, BNP mildly elevated at 383, CBC shows stable chronic anemia, BMP showed creatinine 1.38 ? 1.41 (baseline ~1.5), troponin 55 consistent with chronic elevation. CXR showed cardiomegaly with vascular congestion.   Echocardiogram this admission: LVEF 30-35%, no RWMA, moderate LVH, G3DD, mildly reduced RV function, moderately elevated PASP at 49.6 mmHg, severe LAE, small pericardial effusion, mild MR, dilated IVC.   Compared to echo from March 2024: LVEF was 45-50%, it was noted to be 30% prior. Remote device check from October 2025 showed normal device function, V-paced.   She follows closely with general cardiology as well as EP. She  was last seen by Stephanie Barrack, NP 04/2024 for routine follow up. She was doing well overall. She was noted to be 197 lb at this visit. Her treatment plan at this appointment included: amiodarone  200 mg M-F, Eliquis  2.5 mg BID (renal, age), amlodipine  5 mg daily, carvedilol  6.25 mg BID, losartan  25 mg daily, torsemide  40 mg Q AM and 20-40 mg in PM PRN for swelling/shortness of breath with 20 mEq K daily.   He was last seen by [name] on 03/2023 where her weight was noted to be 199 lb.   After speaking to the patient, she tells me that she started having symptoms around 6 weeks ago when she got her flu shot. She tells me those were more URI symptoms. She then noted that over the last few days she started noticing LE edema and difficulty breathing. She reports good urine output since being placed on IV Lasix  while in the hospital and reports much improvement in regards to her LE edema and shortness of breath/orthopnea.   Past Medical History:  Diagnosis Date   Abnormal echocardiogram    a. possible mass on echo 05/2019 on PPM, b. 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 Previously diastolic but patient AVS from outside hospital listed systolic CHF and cardiomyopathy, records pending.   CKD (chronic kidney  disease), stage III (HCC)    Clotting disorder    CVA (cerebral vascular accident) (HCC)    residual right sided weakness and mild dysphagia   Diabetes mellitus (HCC)    Dizziness and giddiness    DVT (deep venous thrombosis) (HCC)    a. on anticoagulation for this.   Essential hypertension    LBBB (left bundle branch block)    Mitral regurgitation    a. Mod MR by echo 2014.   Muscular deconditioning    Obesity    Pacemaker    Pericardial effusion    SVT  (supraventricular tachycardia)    a. In 2013 she had an EPS with ablation for SVT which did not eliminate the SVT completely. She also had bradycardia which limited medication. She was placed on amiodarone  by Dr. Waddell.   Thrombocytopenia 11/21/2011   Past Surgical History:  Procedure Laterality Date   BREAST BIOPSY Right 11/19/2018   positive   ESOPHAGOGASTRODUODENOSCOPY (EGD) WITH PROPOFOL  N/A 02/10/2022   Procedure: ESOPHAGOGASTRODUODENOSCOPY (EGD) WITH PROPOFOL ;  Surgeon: Stephanie Dover, MD;  Location: [facility] ENDOSCOPY;  Service: Gastroenterology;  Laterality: N/A;   EYE SURGERY     SUPRAVENTRICULAR TACHYCARDIA ABLATION N/A 10/11/2012   Procedure: SUPRAVENTRICULAR TACHYCARDIA ABLATION;  Surgeon: Stephanie LELON Waddell, MD;  Location: [facility] CATH LAB;  Service: Cardiovascular;  Laterality: N/A;    Home Medications:  Prior to Admission medications   Medication Sig Start Date End Date Taking? Authorizing Provider  acetaminophen  (TYLENOL ) 325 MG tablet Take 650 mg by mouth in the morning, at noon, and at bedtime.   Yes [provider]  amiodarone  (PACERONE ) 200 MG tablet Take one tablet by mouth daily Monday-Friday. Do NOT take Saturday and Sunday 06/21/22  Yes Tillery, Ozell Barter, PA-C  amLODipine  (NORVASC ) 5 MG tablet Take 5 mg by mouth daily. 01/26/23  Yes [provider]  carvedilol  (COREG ) 6.25 MG tablet Take 6.25 mg by mouth 2 (two) times daily with a meal.   Yes [provider]  ELIQUIS  2.5 MG TABS tablet TAKE 1 TABLET(2.5 MG) BY MOUTH TWICE DAILY 07/13/20  Yes Dunn, Dayna N, PA-C  fluticasone-salmeterol (ADVAIR) 250-50 MCG/ACT AEPB Inhale 1 puff into the lungs 2 (two) times daily. 08/16/24  Yes [provider]  HYDROcodone -acetaminophen  (NORCO/VICODIN) 5-325 MG tablet Take 1 tablet by mouth every 6 (six) hours as needed for moderate pain (pain score 4-6).   Yes [provider]  insulin  glargine (LANTUS ) 100 UNIT/ML injection Inject 7-12 Units into the skin  See admin instructions. Inject 12 units into the skin in the morning and 7 units in the evening   Yes [provider]  insulin  lispro (HUMALOG  KWIKPEN) 100 UNIT/ML KwikPen Before each meal 3 times a day, 140-199 - 2 units, 200-250 - 4 units, 251-299 - 6 units,  300-349 - 8 units,  350 or above 10 units. Patient taking differently: Inject 2-10 Units into the skin in the morning and at bedtime. If 150-199= 2 units, 200-250= 4 units, 251-299= 6 units, 301-349= 8 units, 350-399= 10 units, recheck 1 hour >400 call [employer] provider 06/05/23  Yes Singh, Prashant K, MD  ipratropium-albuterol  (DUONEB) 0.5-2.5 (3) MG/3ML SOLN Take 3 mLs by nebulization every 4 (four) hours as needed (for shortness of breath).   Yes [provider]  lactulose  (CHRONULAC ) 10 GM/15ML solution Take 20 g by mouth daily as needed for mild constipation.   Yes [provider]  lidocaine (LIDODERM) 5 % Place 1 patch onto the skin daily. Apply to  left posterior shoulder--Remove & Discard patch within 12 hours or as directed by MD   Yes [provider]  Menthol, Topical Analgesic, (BIOFREEZE COOL THE PAIN) 4 % GEL Apply 1 application  topically every 12 (twelve) hours as needed (for osteoarthritis).   Yes [provider]  mirabegron  ER (MYRBETRIQ ) 25 MG TB24 tablet Take 25 mg by mouth daily.   Yes [provider]  omeprazole  (PRILOSEC OTC) 20 MG tablet Take 20 mg by mouth every morning.   Yes [provider]  polyethylene glycol (MIRALAX  / GLYCOLAX ) 17 g packet Take 17 g by mouth daily as needed for mild constipation.   Yes [provider]  Potassium Chloride  ER 20 MEQ TBCR Take 20 mEq by mouth daily.   Yes [provider]  pregabalin  (LYRICA ) 50 MG capsule Take 50 mg by mouth every morning. 06/10/22  Yes [provider]  pregabalin  (LYRICA ) 75 MG capsule Take 1 capsule (75 mg total) by mouth at bedtime. 06/30/20  Yes Georgina Speaks, FNP  senna (SENOKOT) 8.6  MG tablet Take 2 tablets by mouth at bedtime.   Yes [provider]  torsemide  (DEMADEX ) 20 MG tablet Take 40 mg every morning.  Take 20-40 mg daily every afternoon as needed for swelling or shortness of breath Patient taking differently: Take 60 mg by mouth daily. 04/10/23  Yes Dann Candyce RAMAN, MD  Docusate Sodium  100 MG capsule Take 100 mg by mouth every morning.    [provider]  loperamide  (IMODIUM  A-D) 2 MG tablet Take 2 mg by mouth 4 (four) times daily as needed for diarrhea or loose stools. Patient not taking: Reported on 09/04/2024    [provider]  ondansetron  (ZOFRAN ) 4 MG tablet Take 4 mg by mouth every 8 (eight) hours as needed for nausea or vomiting. Patient not taking: Reported on 09/04/2024    [provider]  Propylene Glycol (SYSTANE COMPLETE OP) Place 1 drop into both eyes as needed (for itching and dry eye). Patient not taking: Reported on 09/04/2024    [provider]   Scheduled Meds:  amiodarone   200 mg Oral Daily   apixaban   2.5 mg Oral BID   furosemide   60 mg Intravenous BID   insulin  aspart  0-15 Units Subcutaneous TID WC   insulin  aspart  0-5 Units Subcutaneous QHS   insulin  aspart  3 Units Subcutaneous TID WC   insulin  glargine-yfgn  15 Units Subcutaneous Daily   lactulose   20 g Oral BID   spironolactone  25 mg Oral Daily   Continuous Infusions:  PRN Meds: acetaminophen  **OR** acetaminophen , ondansetron  (ZOFRAN ) IV  Allergies:    Allergies  Allergen Reactions   Aspirin Itching   Penicillins Itching and Rash    DID THE REACTION INVOLVE: Swelling of the face/tongue/throat, SOB, or low BP? Yes Sudden or severe rash/hives, skin peeling, or the inside of the mouth or nose? No Did it require medical treatment? Yes When did it last happen? More than 10 years ago If all above answers are NO, may proceed with cephalosporin use.  Tolerated full course of Unasyn  in 2021.   Remeron  [Mirtazapine ] Other (See  Comments)    Unknown reaction   Social History:   Social History   Socioeconomic History   Marital status: Widowed    Spouse name: Not on file   Number of children: 3   Years of education: Not on file   Highest education level: High school graduate  Occupational History  Occupation: retired  Tobacco Use   Smoking status: Never   Smokeless tobacco: Never  Vaping Use   Vaping status: Never Used  Substance and Sexual Activity   Alcohol use: No   Drug use: No   Sexual activity: Not Currently  Other Topics Concern   Not on file  Social History Narrative   Lives at home with grandchild, daughter comes and help    Social Drivers of Health   Financial Resource Strain: Low Risk  (09/05/2024)   Overall Financial Resource Strain (CARDIA)    Difficulty of Paying Living Expenses: Not very hard  Food Insecurity: No Food Insecurity (09/04/2024)   Hunger Vital Sign    Worried About Running Out of Food in the Last Year: Never true    Ran Out of Food in the Last Year: Never true  Transportation Needs: No Transportation Needs (09/04/2024)   PRAPARE - Administrator, Civil Service (Medical): No    Lack of Transportation (Non-Medical): No  Physical Activity: Inactive (04/02/2020)   Exercise Vital Sign    Days of Exercise per Week: 0 days    Minutes of Exercise per Session: 0 min  Stress: No Stress Concern Present (04/02/2020)   [employer] of Occupational Health - Occupational Stress Questionnaire    Feeling of Stress : Only a little  Social Connections: Moderately Integrated (09/04/2024)   Social Connection and Isolation Panel    Frequency of Communication with Friends and Family: Three times a week    Frequency of Social Gatherings with Friends and Family: Three times a week    Attends Religious Services: 1 to 4 times per year    Active Member of Clubs or Organizations: No    Attends Banker Meetings: 1 to 4 times per year    Marital Status: Widowed   Intimate Partner Violence: Not At Risk (09/04/2024)   Humiliation, Afraid, Rape, and Kick questionnaire    Fear of Current or Ex-Partner: No    Emotionally Abused: No    Physically Abused: No    Sexually Abused: No    Family History:   Family History  Problem Relation Age of Onset   Stroke Mother    Cancer Father        prostate   Hypertension Daughter    Heart attack Brother        x2   Hypertension Brother    Stroke Sister    Hypertension Sister    Breast cancer Maternal Aunt     ROS:  Review of Systems  Cardiovascular:  Positive for dyspnea on exertion, leg swelling, orthopnea and paroxysmal nocturnal dyspnea. Negative for chest pain, claudication, irregular heartbeat, near-syncope, palpitations and syncope.  Respiratory:  Positive for cough, shortness of breath and sputum production.   Hematologic/Lymphatic: Negative for bleeding problem.     Physical Exam/Data: Vitals:   09/04/24 2341 09/05/24 0407 09/05/24 0859 09/05/24 1142  BP: (!) 146/65 137/65 (!) 142/84 (!) 157/69  Pulse: 63 67 68 63  Resp: 20 20 18 16   Temp: 98.6 F (37 C) 98.2 F (36.8 C) (!) 97.4 F (36.3 C) 98.1 F (36.7 C)  TempSrc: Oral Oral Oral Oral  SpO2: 100% 98% 100% 100%  Weight:      Height:        Intake/Output Summary (Last 24 hours) at 09/05/2024 1442 Last data filed at 09/04/2024 1505 Gross per 24 hour  Intake --  Output 500 ml  Net -500 ml  09/03/2024    1:46 PM 07/28/2024   10:07 PM 05/16/2024    1:49 PM  Last 3 Weights  Weight (lbs) 198 lb 195 lb 197 lb  Weight (kg) 89.812 kg 88.451 kg 89.359 kg     Body mass index is 32.95 kg/m.   General:  in no acute distress, on room air HEENT: normal Neck: mild JVD Vascular: No carotid bruits Cardiac:  normal S1, S2; RRR; no murmur  Lungs:  rales at bilateral bases, no increased respiratory effort   Abd: soft, nontender, no hepatomegaly  Ext: 1+ edema Musculoskeletal:  No deformities Skin: warm and dry  Neuro:  no focal  abnormalities noted Psych:  Normal affect   EKG:  The EKG was personally reviewed and demonstrates:  V-paced, HR 66  Telemetry:  Telemetry was personally reviewed and demonstrates:  A-Fib, V-pacing, HR 70s  Relevant CV Studies:  Echocardiogram, 09/04/2024 Left ventricular ejection fraction, by estimation, is 30 to 35% . The left ventricle has moderately decreased function. The left ventricle has no regional wall motion abnormalities. There is moderate concentric left ventricular hypertrophy. Left ventricular diastolic parameters are consistent with Grade III diastolic dysfunction ( restrictive) Right ventricular systolic function is mildly reduced. The right ventricular size is normal. There is moderately elevated pulmonary artery systolic pressure. The estimated right ventricular systolic pressure is 49. 6 mmHg. Left atrial size was severely dilated.  A small pericardial effusion is present. The mitral valve is normal in structure. Mild mitral valve regurgitation. No evidence of mitral stenosis.  The aortic valve is tricuspid. Aortic valve regurgitation is not visualized. No aortic stenosis is present.  The inferior vena cava is dilated in size with < 50% respiratory variability, suggesting right atrial pressure of 15 mmHg.  Lexiscan , 04/13/2018 Nuclear stress EF: 50%. Defect 1: There is a medium defect of severe severity present in the basal inferoseptal and mid inferoseptal location. This is a low risk study. The left ventricular ejection fraction is mildly decreased (45-54%).   Abnormal, low risk stress nuclear study with fixed inferoseptal defect c/w LBBB vs prior infarct; no ischemia; EF 50 with hypokinesis of the inferosepal wall; mild LVE.  Laboratory Data: High Sensitivity Troponin:   Recent Labs  Lab 09/04/24 0305  TROPONINIHS 55*     Chemistry Recent Labs  Lab 09/03/24 1633 09/04/24 0627 09/05/24 0328  NA 143 145 141  K 3.9 3.4* 3.6  CL 100 102 102  CO2 28 28 26    GLUCOSE 267* 227* 172*  BUN 18 15 14   CREATININE 1.38* 1.21* 1.41*  CALCIUM  8.7* 8.5* 8.6*  GFRNONAA 37* 43* 36*  ANIONGAP 15 15 13     Recent Labs  Lab 09/04/24 0627  PROT 6.6  ALBUMIN  3.2*  AST 17  ALT 13  ALKPHOS 63  BILITOT 0.8   Lipids No results for input(s): CHOL, TRIG, HDL, LABVLDL, LDLCALC, CHOLHDL in the last 168 hours.  Hematology Recent Labs  Lab 09/03/24 1633  WBC 4.4  RBC 3.88  HGB 10.9*  HCT 35.9*  MCV 92.5  MCH 28.1  MCHC 30.4  RDW 15.1  PLT 161   Thyroid  No results for input(s): TSH, FREET4 in the last 168 hours.  BNP Recent Labs  Lab 09/03/24 1633  BNP 383.6*    DDimer No results for input(s): DDIMER in the last 168 hours.  Radiology/Studies:  ECHOCARDIOGRAM COMPLETE Result Date: 09/04/2024    ECHOCARDIOGRAM REPORT   Patient Name:   Wilmoth ZAIRAH ARISTA Date of Exam: 09/04/2024 Medical Rec #:  [id number]    Height:       65.0 in Accession #:    [id number]   Weight:       198.0 lb Date of Birth:  April 25, 1933    BSA:          1.970 m Patient Age:    89 years     BP:           145/68 mmHg Patient Gender: F            HR:           60 bpm. Exam Location:  Inpatient Procedure: 2D Echo, Cardiac Doppler and Color Doppler (Both Spectral and Color            Flow Doppler were utilized during procedure). Indications:    CHF-acute systolic 150.21  History:        Patient has prior history of Echocardiogram examinations, most                 recent 12/29/2022. CHF, Pacemaker, Arrythmias:Atrial Fibrillation,                 Signs/Symptoms:Chest Pain and Syncope; Risk Factors:Hypertension                 and Diabetes. History of breast cancer.  Sonographer:    Merlynn Argyle Referring Phys: [id number] VASUNDHRA RATHORE IMPRESSIONS  1. Left ventricular ejection fraction, by estimation, is 30 to 35%. The left ventricle has moderately decreased function. The left ventricle has no regional wall motion abnormalities. There is moderate concentric left ventricular  hypertrophy. Left ventricular diastolic parameters are consistent with Grade III diastolic dysfunction (restrictive).  2. Right ventricular systolic function is mildly reduced. The right ventricular size is normal. There is moderately elevated pulmonary artery systolic pressure. The estimated right ventricular systolic pressure is 49.6 mmHg.  3. Left atrial size was severely dilated.  4. A small pericardial effusion is present.  5. The mitral valve is normal in structure. Mild mitral valve regurgitation. No evidence of mitral stenosis.  6. The aortic valve is tricuspid. Aortic valve regurgitation is not visualized. No aortic stenosis is present.  7. The inferior vena cava is dilated in size with <50% respiratory variability, suggesting right atrial pressure of 15 mmHg. Comparison(s): Changes from prior study are noted. LV function worse than prior. FINDINGS  Left Ventricle: Left ventricular ejection fraction, by estimation, is 30 to 35%. The left ventricle has moderately decreased function. The left ventricle has no regional wall motion abnormalities. The left ventricular internal cavity size was normal in size. There is moderate concentric left ventricular hypertrophy. Left ventricular diastolic parameters are consistent with Grade III diastolic dysfunction (restrictive). Right Ventricle: The right ventricular size is normal. No increase in right ventricular wall thickness. Right ventricular systolic function is mildly reduced. There is moderately elevated pulmonary artery systolic pressure. The tricuspid regurgitant velocity is 2.94 m/s, and with an assumed right atrial pressure of 15 mmHg, the estimated right ventricular systolic pressure is 49.6 mmHg. Left Atrium: Left atrial size was severely dilated. Right Atrium: Right atrial size was normal in size. Pericardium: A small pericardial effusion is present. Mitral Valve: The mitral valve is normal in structure. Mild mitral valve regurgitation. No evidence of  mitral valve stenosis. MV peak gradient, 3.3 mmHg. The mean mitral valve gradient is 1.0 mmHg. Tricuspid Valve: The tricuspid valve is normal in structure. Tricuspid valve regurgitation is mild . No evidence of tricuspid stenosis. Aortic Valve: The aortic valve is  tricuspid. Aortic valve regurgitation is not visualized. No aortic stenosis is present. Pulmonic Valve: The pulmonic valve was normal in structure. Pulmonic valve regurgitation is mild. No evidence of pulmonic stenosis. Aorta: The aortic root is normal in size and structure. Venous: The inferior vena cava is dilated in size with less than 50% respiratory variability, suggesting right atrial pressure of 15 mmHg. IAS/Shunts: The interatrial septum is aneurysmal. No atrial level shunt detected by color flow Doppler.  LEFT VENTRICLE PLAX 2D LVIDd:         4.40 cm     Diastology LVIDs:         3.50 cm     LV e' medial:    2.81 cm/s LV PW:         1.30 cm     LV E/e' medial:  29.9 LV IVS:        1.45 cm     LV e' lateral:   8.56 cm/s LVOT diam:     2.40 cm     LV E/e' lateral: 9.8 LV SV:         63 LV SV Index:   32 LVOT Area:     4.52 cm LV IVRT:       120 msec  LV Volumes (MOD) LV vol d, MOD A2C: 90.0 ml LV vol d, MOD A4C: 80.3 ml LV vol s, MOD A2C: 62.2 ml LV vol s, MOD A4C: 56.4 ml LV SV MOD A2C:     27.8 ml LV SV MOD A4C:     80.3 ml LV SV MOD BP:      25.7 ml RIGHT VENTRICLE            IVC RV Basal diam:  3.80 cm    IVC diam: 2.30 cm RV Mid diam:    3.10 cm RV S prime:     9.55 cm/s TAPSE (M-mode): 1.0 cm LEFT ATRIUM              Index        RIGHT ATRIUM           Index LA diam:        5.30 cm  2.69 cm/m   RA Area:     20.70 cm LA Vol (A2C):   149.0 ml 75.65 ml/m  RA Volume:   52.20 ml  26.50 ml/m LA Vol (A4C):   130.0 ml 66.00 ml/m LA Biplane Vol: 139.0 ml 70.57 ml/m  AORTIC VALVE             PULMONIC VALVE LVOT Vmax:   71.00 cm/s  PR End Diast Vel: 6.97 msec LVOT Vmean:  45.100 cm/s LVOT VTI:    0.139 m  AORTA Ao Root diam: 3.20 cm Ao Asc diam:   3.30 cm MITRAL VALVE                  TRICUSPID VALVE MV Area (PHT): 3.46 cm       TR Peak grad:   34.6 mmHg MV Area VTI:   2.28 cm       TR Vmax:        294.00 cm/s MV Peak grad:  3.3 mmHg MV Mean grad:  1.0 mmHg       SHUNTS MV Vmax:       0.90 m/s       Systemic VTI:  0.14 m MV Vmean:      55.0 cm/s      Systemic Diam: 2.40 cm MV  Decel Time: 219 msec MR Peak grad:    87.2 mmHg MR Mean grad:    58.5 mmHg MR Vmax:         467.00 cm/s MR Vmean:        359.5 cm/s MR PISA:         0.57 cm MR PISA Eff ROA: 4 mm MR PISA Radius:  0.30 cm MV E velocity: 84.00 cm/s MV A velocity: 36.00 cm/s MV E/A ratio:  2.33 Morene Brownie Electronically signed by Morene Brownie Signature Date/Time: 09/04/2024/4:42:07 PM    Final    DG Chest 2 View Result Date: 09/03/2024 CLINICAL DATA:  Cough. EXAM: CHEST - 2 VIEW COMPARISON:  Chest radiograph dated 07/28/2024. FINDINGS: Cardiomegaly with vascular congestion. No focal consolidation, pleural effusion or pneumothorax. No acute pathology visualized. New the pathology. Degenerative changes spine and shoulders. IMPRESSION: Cardiomegaly with vascular congestion. No focal consolidation. Electronically Signed   By: Vanetta Chou M.D.   On: 09/03/2024 15:02   Assessment and Plan:  Acute on chronic combined CHF Home meds: torsemide  60 mg daily, carvedilol  6.25 mg BID Presented to ED with worsening DOE, LE edema, orthopnea Echo 12/2022 showed LVEF 45-50% Echo this admission: LVEF 30-35%, moderate LVH, G3DD, mildly reduced RV, moderately elevated PASP  BNP 383  Started on IV Lasix  this admission  Baseline creatinine ~1.5  Net -2.6 L this admission, reports good urine output Reports improvement in symptoms with IV diuresis  She believes most of her excess fluid to be dietary as the food at her current SNF is very salty  Currently on IV Lasix  60 mg BID, okay to continue at this dose and monitor response  Currently on spironolactone 25 mg daily   Continue to monitor daily  weights, strict I&O's, daily BMPs Follow up made with our advanced heart failure TOC clinic 09/11/2024  Hypertension Home meds: carvedilol  6.25 mg BID, amlodipine  5 mg daily SBP 130-150s this admission  Continue home medications Spironolactone was added today  Hold norvasc  and will uptitrate GDMT as renal function allows   Permanent A-Fib/flutter  Home meds: Eliquis  2.5 mg BID (renal, age), carvedilol  6.25 mg BID, amiodarone  200 mg Mon-Fri Follows closely with EP Continue home medications  Follow up with EP and consider adjusting dose of amiodarone  in the future if indicated   History of SVT  Complete heart block s/p PPM Device check from 10//2025 showed normal device function, V-paced Follows closely with EP Continue to follow up with EP as outpatient Continue remote device checks   Per primary CKD 3b Chronic anemia History of CVA Type 2 DM  Risk Assessment/Risk Scores:      [place]  Heart Association (NYHA) Functional Class NYHA Class II  CHA2DS2-VASc Score = 8   This indicates a 10.8% annual risk of stroke. The patient's score is based upon: CHF History: 1 HTN History: 1 Diabetes History: 1 Stroke History: 2 Vascular Disease History: 0 Age Score: 2 Gender Score: 1     For questions or updates, please contact [facility] Please consult [email or web address] for contact info under   Signed, [name] DELENA Donath, PA-C  09/05/2024 2:42 PM  ADDENDUM:   Patient seen and examined with [name] DELENA Donath, PA-C.  I personally taken a history, examined the patient, reviewed relevant notes,  laboratory data / imaging studies.  I performed a substantive portion of this encounter and formulated the important aspects of the plan.  I agree with the APP's note, impression, and recommendations; however, I have edited  the note to reflect changes or salient points.   Patient presents to the hospital from her nursing home due to worsening shortness of breath and concern for  possible pneumonia or heart failure exacerbation.  Patient states that about 6 weeks ago she had her flu shot and since then has been having URI symptoms.  They have been treating her for bronchitis and also had given her antibiotics without significant improvement.  Several days prior to the hospitalization she was having shortness of breath with effort related activity, orthopnea, PND.  She has sleep apnea and cannot tolerate device therapy and therefore uses nocturnal oxygen.  She has been diuresed by internal medicine and cardiology consulted for heart failure management.  Echocardiogram notes moderately reduced LVEF with grade 3 diastolic dysfunction, RVSP suggestive of moderate pulmonary hypertension, severely dilated left atrium, small pericardial effusion, and no other significant valvular heart disease, estimated RAP was 15 mmHg.  Patient states that she is compliant with medical therapy. She is attributing her exacerbation secondary to increased salt in the diet that she receives at the nursing facility as well as the recent flulike symptoms.  Since admission patient states that she is less short of breath.  She has not experienced any exertional chest pain.  PHYSICAL EXAM: Today's Vitals   09/05/24 0530 09/05/24 0859 09/05/24 0904 09/05/24 1142  BP:  (!) 142/84  (!) 157/69  Pulse:  68  63  Resp:  18  16  Temp:  (!) 97.4 F (36.3 C)  98.1 F (36.7 C)  TempSrc:  Oral  Oral  SpO2:  100%  100%  Weight:      Height:      PainSc: 3   0-No pain    Body mass index is 32.95 kg/m.   Net IO Since Admission: -2,600 mL [09/05/24 1502]  Filed Weights   09/03/24 1346  Weight: 89.8 kg    Physical Exam  Constitutional: No distress. She appears chronically ill.  hemodynamically stable  Neck: No JVD present.  Cardiovascular: Normal rate, regular rhythm, S1 normal and S2 normal. Exam reveals no gallop, no S3 and no S4.  No murmur heard. Pulmonary/Chest: Effort normal and breath  sounds normal. No stridor. She has no wheezes. She has no rales.  Pacemaker site is well-healed  Abdominal: Soft. She exhibits no distension. There is no abdominal tenderness.  Musculoskeletal:        General: No edema.     Cervical back: Neck supple.  Neurological: She is alert and oriented to person, place, and time.  Skin: Skin is warm.    EKG: (personally reviewed by me) 09/04/24: Atrial flutter with controlled ventricular rate, occasionally V paced, left axis, left bundle branch block  Telemetry: (personally reviewed by me) Atrial fibrillation/flutter, PVCs   Impression & Recommendations: :  Acute systolic diastolic heart failure Cardiomyopathy Stage C, NYHA class II/III Improving since admission Her prior echocardiograms have illustrated LVEF ranging between mild to moderately reduced. Her current echocardiogram notes moderately reduced LVEF, elevated RAP, and elevated PASP. Net IO Since Admission: -2,600 mL [09/05/24 1510] Currently on Lasix  60 mg IV push twice daily Primary team started spironolactone 25 mg p.o. daily Continue strict I's and O's and daily weights. Clinically appears to be euvolemic. Uptitrate GDMT.  Permanent atrial fibrillation/flutter: SVT High risk medication: Amiodarone  High CHA2DS2-VASc score Currently on amiodarone  200 mg p.o. Monday through Friday, based on EMR due to her history of SVT.  It appears that she was placed on this by EP.  May consider reducing amiodarone  to 100 mg p.o. daily and continue to monitor amiodarone  side side effect profile.  Her last PFT was in 2020. LFTs and TSH are within acceptable limits Carvedilol  has been held, will resume once euvolemic And Eliquis  also needs to be monitored closely due to renal dosing.  History of complete heart block status post pacemaker implant: Follows with EP closely See above  Further recommendations to follow as the case evolves.   This note was created using a voice recognition  software as a result there may be grammatical errors inadvertently enclosed that do not reflect the nature of this encounter. Every attempt is made to correct such errors.   Madonna Michele HAS, [facility]  A Division of [facility] [address] [place], [place] [address]  Pager: [phone] Office: [phone] 09/05/2024 3:02 PM

## 2024-09-06 ENCOUNTER — Telehealth (HOSPITAL_COMMUNITY): Payer: Self-pay | Admitting: Pharmacy Technician

## 2024-09-06 ENCOUNTER — Other Ambulatory Visit (HOSPITAL_COMMUNITY): Payer: Self-pay

## 2024-09-06 DIAGNOSIS — N184 Chronic kidney disease, stage 4 (severe): Secondary | ICD-10-CM

## 2024-09-06 DIAGNOSIS — I10 Essential (primary) hypertension: Secondary | ICD-10-CM

## 2024-09-06 DIAGNOSIS — I429 Cardiomyopathy, unspecified: Secondary | ICD-10-CM | POA: Diagnosis not present

## 2024-09-06 DIAGNOSIS — I5023 Acute on chronic systolic (congestive) heart failure: Secondary | ICD-10-CM | POA: Diagnosis not present

## 2024-09-06 DIAGNOSIS — E1122 Type 2 diabetes mellitus with diabetic chronic kidney disease: Secondary | ICD-10-CM

## 2024-09-06 DIAGNOSIS — Z7901 Long term (current) use of anticoagulants: Secondary | ICD-10-CM | POA: Diagnosis not present

## 2024-09-06 DIAGNOSIS — I5041 Acute combined systolic (congestive) and diastolic (congestive) heart failure: Secondary | ICD-10-CM | POA: Diagnosis not present

## 2024-09-06 DIAGNOSIS — Z794 Long term (current) use of insulin: Secondary | ICD-10-CM

## 2024-09-06 DIAGNOSIS — I4821 Permanent atrial fibrillation: Secondary | ICD-10-CM | POA: Diagnosis not present

## 2024-09-06 LAB — BASIC METABOLIC PANEL WITH GFR
Anion gap: 11 (ref 5–15)
BUN: 20 mg/dL (ref 8–23)
CO2: 26 mmol/L (ref 22–32)
Calcium: 8.5 mg/dL — ABNORMAL LOW (ref 8.9–10.3)
Chloride: 101 mmol/L (ref 98–111)
Creatinine, Ser: 1.58 mg/dL — ABNORMAL HIGH (ref 0.44–1.00)
GFR, Estimated: 31 mL/min — ABNORMAL LOW (ref >60)
Glucose, Bld: 205 mg/dL — ABNORMAL HIGH (ref 70–99)
Potassium: 3.6 mmol/L (ref 3.5–5.1)
Sodium: 138 mmol/L (ref 135–145)

## 2024-09-06 LAB — GLUCOSE, CAPILLARY
Glucose-Capillary: 201 mg/dL — ABNORMAL HIGH (ref 70–99)
Glucose-Capillary: 223 mg/dL — ABNORMAL HIGH (ref 70–99)
Glucose-Capillary: 106 mg/dL — ABNORMAL HIGH (ref 70–99)
Glucose-Capillary: 140 mg/dL — ABNORMAL HIGH (ref 70–99)

## 2024-09-06 MED ORDER — ISOSORB DINITRATE-HYDRALAZINE 20-37.5 MG PO TABS
1.0000 | ORAL_TABLET | Freq: Three times a day (TID) | ORAL | Status: DC
  Administered 2024-09-06 – 2024-09-12 (×19): 1 via ORAL
  Filled 2024-09-06 (×19): qty 1

## 2024-09-06 MED ORDER — AMIODARONE HCL 200 MG PO TABS
200.0000 mg | ORAL_TABLET | ORAL | Status: DC
  Administered 2024-09-09 – 2024-09-12 (×4): 200 mg via ORAL
  Filled 2024-09-06 (×4): qty 1

## 2024-09-06 MED ORDER — TORSEMIDE 20 MG PO TABS
20.0000 mg | ORAL_TABLET | Freq: Every morning | ORAL | Status: DC
  Administered 2024-09-06 – 2024-09-07 (×2): 20 mg via ORAL
  Filled 2024-09-06 (×2): qty 1

## 2024-09-06 NOTE — Progress Notes (Signed)
 Mobility Specialist Progress Note:   09/06/24 1032  Mobility  Activity Pivoted/transferred from bed to chair  Level of Assistance Moderate assist, patient does 50-74%  Assistive Device Other (Comment) (HHA)  Distance Ambulated (ft) 3 ft  Activity Response Tolerated well  Mobility Referral Yes  Mobility visit 1 Mobility  Mobility Specialist Start Time (ACUTE ONLY) 1032  Mobility Specialist Stop Time (ACUTE ONLY) 1047  Mobility Specialist Time Calculation (min) (ACUTE ONLY) 15 min   Pt received in bed, eager to transfer to recliner. Tolerated well. Sat EOB with MinA. ModA+2 to stand and pivot to chair via HHA. Left sitting up in chair with phone, table and call bell in reach. All needs met. RN aware.   Staphany Ditton Mobility Specialist Please contact via Special Educational Needs Teacher or  Rehab office at [phone]

## 2024-09-06 NOTE — Progress Notes (Addendum)
 Progress Note  Patient Name: [name] Date of Encounter: 09/06/2024 [facility] Cardiologist: Oneil Parchment, MD   Interval Summary   Patient reports feeling much better today Spoke with her daughter via the phone  She says that her breathing and LE edema are much improved  She typically wears 2 L nocturnal oxygen at SNF but [name] not required any here Discussed some medication changes, patient is agreeable to plan   Vital Signs Vitals:   09/06/24 0040 09/06/24 0243 09/06/24 0649 09/06/24 0821  BP: (!) 153/67 (!) 154/65  (!) 148/74  Pulse: 72 65  60  Resp:  16  15  Temp: 97.9 F (36.6 C) 98.5 F (36.9 C)  98.7 F (37.1 C)  TempSrc: Oral Oral  Oral  SpO2:  97%  96%  Weight:   82.9 kg   Height:        Intake/Output Summary (Last 24 hours) at 09/06/2024 1202 Last data filed at 09/05/2024 2203 Gross per 24 hour  Intake 120 ml  Output 750 ml  Net -630 ml   Net IO Since Admission: -3,230 mL [09/06/24 1202]     09/06/2024    6:49 AM 09/03/2024    1:46 PM 07/28/2024   10:07 PM  Last 3 Weights  Weight (lbs) 182 lb 12.2 oz 198 lb 195 lb  Weight (kg) 82.9 kg 89.812 kg 88.451 kg     Telemetry/ECG  A-Fib, intermittently V-paced, HR 60s - Personally Reviewed  Physical Exam  GEN: No acute distress.   Neck: No JVD Cardiac: iRRR, no murmurs, rubs, or gallops.  Respiratory: Clear to auscultation bilaterally. GI: Soft, nontender, non-distended  MS: No edema  Assessment & Plan  Acute on chronic combined CHF Home meds: torsemide  60 mg daily, carvedilol  6.25 mg BID Presented to ED with worsening DOE, LE edema, orthopnea Echo 12/2022 showed LVEF 45-50% Echo this admission: LVEF 30-35%, moderate LVH, G3DD, mildly reduced RV, moderately elevated PASP  BNP 383  Baseline creatinine ~1.5  Net -3.2 L this admission, reports good urine output Believes she held on to fluid 2/2 dietary sodium at SNF Creatine trending up today but near baseline   Given IV Lasix  60 mg x 1  this morning, held further IV diuresis at this time can consider adding back home torsemide  tomorrow pending renal function and volume status in AM Continue spironolactone 25 mg daily, trend renal function tomorrow and consider holding if needed  Adding low-dose BiDil  today  Continue to monitor daily weights, strict I&O's, daily BMPs Follow up made with our advanced heart failure TOC clinic 09/11/2024   Hypertension Home meds: carvedilol  6.25 mg BID, amlodipine  5 mg daily SBP 130-150s this admission  Continue to monitor daily weights, strict I&O's, daily BMPs Hold home medications and will uptitrate GDMT as renal function allows  Adding low-dose BiDil  today   Permanent A-Fib/flutter  Home meds: Eliquis  2.5 mg BID (renal, age), carvedilol  6.25 mg BID, amiodarone  200 mg Mon-Fri Follows closely with EP Continue amiodarone  200 mg M-F Continue Eliquis  2.5 mg BID   Follow up with EP and consider adjusting dose of amiodarone  in the future if indicated    History of SVT  Complete heart block s/p PPM Device check from 10//2025 showed normal device function, V-paced Follows closely with EP Continue to follow up with EP as outpatient Continue remote device checks    Per primary CKD 3b Chronic anemia History of CVA Type 2 DM   For questions or updates, please contact [facility] Please consult [email or web address] for contact info under       Signed, Waddell DELENA Donath, PA-C   ADDENDUM:   Patient seen and examined with Waddell DELENA Donath, PA-C.  I personally taken a history, examined the patient, reviewed relevant notes,  laboratory data / imaging studies.  I performed a substantive portion of this encounter and formulated the important aspects of the plan.  I agree with the APP's note, impression, and recommendations; however, I have edited the note to reflect changes or salient points.   Patient seen and examined at bedside. Denies chest pain. States volume status and  shortness of breath is significantly improved.  PHYSICAL EXAM: Today's Vitals   09/06/24 0040 09/06/24 0243 09/06/24 0649 09/06/24 0821  BP: (!) 153/67 (!) 154/65  (!) 148/74  Pulse: 72 65  60  Resp:  16  15  Temp: 97.9 F (36.6 C) 98.5 F (36.9 C)  98.7 F (37.1 C)  TempSrc: Oral Oral  Oral  SpO2:  97%  96%  Weight:   82.9 kg   Height:      PainSc: 0-No pain 0-No pain  0-No pain   Body mass index is 30.41 kg/m.   Net IO Since Admission: -3,230 mL [09/06/24 1202]  Filed Weights   09/03/24 1346 09/06/24 0649  Weight: 89.8 kg 82.9 kg    Physical Exam  Constitutional: No distress. She appears chronically ill.  hemodynamically stable  Neck: No JVD present.  Cardiovascular: Normal rate, S1 normal and S2 normal. An irregularly irregular rhythm present. Exam reveals no gallop, no S3 and no S4.  No murmur heard. Pulmonary/Chest: Effort normal and breath sounds normal. No stridor. She [name] no wheezes. She [name] no rales.  Abdominal: Soft. She exhibits no distension. There is no abdominal tenderness.  Musculoskeletal:        General: No edema.     Cervical back: Neck supple.  Skin: Skin is warm and dry.    EKG: (personally reviewed by me) No new EKGs  Telemetry: (personally reviewed by me) Atrial fibrillation with controlled ventricular rate with intermittent episodes of ventricular paced rhythm   Impression & Recommendations: :  Acute systolic diastolic heart failure Cardiomyopathy Stage C, NYHA class II Improving since admission Her prior echocardiograms have illustrated LVEF ranging between mild to moderately reduced. Her current echocardiogram notes moderately reduced LVEF, elevated RAP, and elevated PASP. Net IO Since Admission: -3,230 mL [09/06/24 1202] Was on IV Lasix  - currently held due to renal function Continue spironolactone 25 mg p.o. daily. Add BiDil  20/37.5 mg p.o. twice daily. Add Torsemide  20mg  po qAM.  Follow-up renal function tomorrow. Continue  strict I's and O's and daily weights. Clinically appears to be euvolemic. Uptitrate GDMT.   Permanent atrial fibrillation/flutter: SVT High risk medication: Amiodarone  High CHA2DS2-VASc score Currently on amiodarone  200 mg p.o. Monday through Friday, based on EMR due to her history of SVT.  It appears that she was placed on this by EP for PSVT  May consider reducing amiodarone  to 100 mg p.o. daily and continue to monitor amiodarone  side effect profile.  Her last PFT was in 2020.  Recommend rechecking it as outpatient. LFTs and TSH are within acceptable limits At home she is on carvedilol  6.25 mg p.o. twice daily, consider restarting Coreg  tomorrow 09/07/2024 either 3.125 or 6.25mg  formulation based on her BP and HR.  Eliquis  also needs to be monitored closely due to renal dosing.   History of complete heart block status post pacemaker implant:  Follows with EP closely See above  Care discussed with attending physician.   Further recommendations to follow as the case evolves.   This note was created using a voice recognition software as a result there may be grammatical errors inadvertently enclosed that do not reflect the nature of this encounter. Every attempt is made to correct such errors.   [name], [facility]  A Division of [facility] [address] [place], [place] [address]  Pager: [phone] Office: [phone] 09/06/2024 12:02 PM

## 2024-09-06 NOTE — Telephone Encounter (Signed)
 Patient Product/process Development Scientist completed.    The patient is insured through [facility]. Patient has Medicare and is not eligible for a copay card, but may be able to apply for patient assistance or Medicare RX Payment Plan (Patient Must reach out to their plan, if eligible for payment plan), if available.    Ran test claim for isosorbide -hydralazine  20-37.5 mg and the current 30 day co-pay is $0.00.   This test claim was processed through [facility]- copay amounts may vary at other pharmacies due to pharmacy/plan contracts, or as the patient moves through the different stages of their insurance plan.     Reyes Sharps, CPHT Pharmacy Technician Patient Advocate Specialist Lead [facility] Patient Advocate Team Direct Number: [phone]  Fax: [phone]

## 2024-09-06 NOTE — Evaluation (Signed)
 Occupational Therapy Evaluation Patient Details Name: [name] MRN: [id number] DOB: 06-May-1933 Today's Date: 09/06/2024   History of Present Illness   Pt is a 89 y/o female who presented to [facility] on 09/[facility]24 with c/o worsening shortness of breath, orthopnea, paroxysmal nocturnal dyspnea, and bilateral lower extremity edema for several weeks. Admitted for evaluation of acute on chronic CHF. PMH includes: arthrits, anemia, cataract, CHB status post PPM, permanent A-fib on Eliquis , SVT, HFrEF, OSA on 2 L supplemental oxygen at bedtime (BiPAP intolerant per pulmonology note), breast cancer status post antiestrogen therapy with anastrozole  11/28/2018-02/21/2022, CKD stage IIIb, CVA, type 2 diabetes, hypertension, history of DVT, GERD     Clinical Impressions At baseline, pt receives assistance with ADLs and IADLs, uses a wheelchair for functional mobility, and performs functional transfers with +1 assist. Pt now presents with decreased activity tolerance, increased fatigue, generalized B UE weakness, pain in B shoulder with ROM, decreased B UE coordination, impaired cognition, and decreased safety and independence with functional tasks. Pt currently demonstrating ability to complete ADLs largely with Set up to Total assist and bed mobility with Min to Mod assist. Pt declined OOB transfer this session due to fatigue; however, earlier this day, pt required Mod assist +2 for functional transfer from bed <-> recliner. Pt participated well in session, but was limited by fatigue. Pt VSS on RA. Pt will benefit from acute OT services to address deficits and increase safety and independence with functional tasks. Post acute discharge, pt will benefit from return to LTC with skilled OT services to maximize rehab potential and return to PLOF.      If plan is discharge home, recommend the following:   Two people to help with walking and/or transfers;A lot of help with bathing/dressing/bathroom;Assistance with  cooking/housework;Direct supervision/assist for medications management;Direct supervision/assist for financial management;Assist for transportation;Help with stairs or ramp for entrance     Functional Status Assessment   Patient has had a recent decline in their functional status and demonstrates the ability to make significant improvements in function in a reasonable and predictable amount of time.     Equipment Recommendations   Other (comment) (defer to next level of care)     Recommendations for Other Services         Precautions/Restrictions   Precautions Precautions: Fall Recall of Precautions/Restrictions: Impaired Restrictions Weight Bearing Restrictions Per Provider Order: No     Mobility Bed Mobility Overal bed mobility: Needs Assistance Bed Mobility: Rolling Rolling: Min assist, Mod assist, Used rails         General bed mobility comments: cues for hand placement/technique    Transfers Overall transfer level: Needs assistance                 General transfer comment: Pt declined OOB transfer this session due to fatigue. Earlier this day, pt completed stand-pivot transfer with [employer] with Mod assist +2      Balance                                           ADL either performed or assessed with clinical judgement   ADL Overall ADL's : Needs assistance/impaired Eating/Feeding: Set up;Bed level   Grooming: Set up;Contact guard assist;Bed level   Upper Body Bathing: Moderate assistance;Bed level;Cueing for sequencing;Cueing for compensatory techniques   Lower Body Bathing: Total assistance;Bed level   Upper Body Dressing : Minimal  assistance;Moderate assistance;Bed level;Cueing for sequencing   Lower Body Dressing: Total assistance;Bed level     Toilet Transfer Details (indicate cue type and reason): deferred this session Toileting- Clothing Manipulation and Hygiene: Total assistance;Bed level          General ADL Comments: Pt with decreased activity tolerance, fatiguing quickly during tasks. Eval completed from bed level due to pt declining sitting EOB or OOB transfer this session due to fatigue after sitting up in chair earlier this morning.     Vision Baseline Vision/History: 1 Wears glasses;4 Cataracts (Hx of cataracts per chart) Ability to See in Adequate Light: 0 Adequate (with glasses on) Patient Visual Report: No change from baseline Additional Comments: Vision [facility] for tasks assessed with glasses on; not formally screened or evaluated; hx of cataracts per chart     Perception         Praxis         Pertinent Vitals/Pain Pain Assessment Pain Assessment: Faces Faces Pain Scale: Hurts even more Pain Location: B shoulders with ROM; pt also reports intermittent abdominal pain but none this session Pain Descriptors / Indicators: Aching, Discomfort, Grimacing, Guarding, Sore Pain Intervention(s): Limited activity within patient's tolerance, Monitored during session, Repositioned     Extremity/Trunk Assessment Upper Extremity Assessment Upper Extremity Assessment: Right hand dominant;RUE deficits/detail;LUE deficits/detail;Generalized weakness (Pt reports she uses both L and R hands for tasks since prior CVA) RUE Deficits / Details: hx of CVA with residual R side weakness; generalized weakness; AROM/AAROM shoulder flexion to approx 50 degrees; decreased coordination RUE: Shoulder pain with ROM RUE Coordination: decreased gross motor;decreased fine motor LUE Deficits / Details: generalized weakness; AROM/AAROM shoulder flexion to approx 70 degrees; decreased coordination; pt reports hx of arthritis in shoulder with hx of having fluid removed from shoulder joint multiple times LUE: Shoulder pain with ROM LUE Coordination: decreased fine motor;decreased gross motor   Lower Extremity Assessment Lower Extremity Assessment: Defer to PT evaluation       Communication  Communication Communication: No apparent difficulties   Cognition Arousal: Lethargic, Alert (Pt asleep and initially lethargic, but quickly alerted with activity) Behavior During Therapy: WFL for tasks assessed/performed Cognition: Cognition impaired, No family/caregiver present to determine baseline   Orientation impairments: Time, Situation Awareness: Intellectual awareness intact, Online awareness impaired Memory impairment (select all impairments): Short-term memory, Working civil service fast streamer, Conservation officer, historic buildings Attention impairment (select first level of impairment): Selective attention Executive functioning impairment (select all impairments): Organization, Sequencing, Reasoning, Problem solving OT - Cognition Comments: Pt oriented to self and being in the hospital. Pt pleasant throughout session. No caregiver/family present to confirm baseline cognition                 Following commands: Impaired Following commands impaired: Only follows one step commands consistently, Follows one step commands with increased time, Follows multi-step commands inconsistently, Follows multi-step commands with increased time     Cueing  General Comments   Cueing Techniques: Verbal cues;Gestural cues;Tactile cues;Visual cues  VSS on RA throughout session   Exercises     Shoulder Instructions      Home Living Family/patient expects to be discharged to:: Skilled nursing facility                                 Additional Comments: Pt is a LTC resident of [name].      Prior Functioning/Environment Prior Level of Function : Needs assist;Other (comment) (Pt able  to provide limited information due to current cognitive level)             Mobility Comments: Pt reports at baseline she is able to transfer bed<->w/c and w/c <-> commode with MOd I; however, pt reports she has been needing assist for transfers for a while. Pt also reports she has been working with PT  at [facility]. ADLs Comments: Pt reports at baseline she self-feed with Set up and receives assistance for all other ADLs. Pt reports she and staff work together to complete ADLs. Pt is unable to provide further information regarding level of assistance. Pt reports she had been receiving OT at SNF, but stopped due to pain in Left shoulder. Pt uncertain when OT was stopped.    OT Problem List: Decreased strength;Decreased range of motion;Decreased activity tolerance;Decreased cognition;Decreased coordination   OT Treatment/Interventions: Self-care/ADL training;Therapeutic exercise;DME and/or AE instruction;Energy conservation;Therapeutic activities;Cognitive remediation/compensation;Patient/family education;Balance training      OT Goals(Current goals can be found in the care plan section)   Acute Rehab OT Goals Patient Stated Goal: to return to SNF and conitnue using the bike with PT OT Goal Formulation: With patient Time For Goal Achievement: 09/20/24 Potential to Achieve Goals: Fair ADL Goals Pt Will Perform Upper Body Bathing: with min assist;sitting Pt Will Perform Upper Body Dressing: with contact guard assist;sitting Pt Will Transfer to Toilet: with min assist;stand pivot transfer;bedside commode (with least restrictive AD) Pt Will Perform Toileting - Clothing Manipulation and hygiene: with mod assist;sitting/lateral leans;sit to/from stand Pt/caregiver will Perform Home Exercise Program: Increased ROM;Increased strength;Both right and left upper extremity;With minimal assist;With written HEP provided (AROM progressing to theraband; decreased pain in B shoulders)   OT Frequency:  Min 1X/week    Co-evaluation              AM-PAC OT 6 Clicks Daily Activity     Outcome Measure Help from another person eating meals?: A Little Help from another person taking care of personal grooming?: A Little Help from another person toileting, which includes using toliet, bedpan, or urinal?:  Total Help from another person bathing (including washing, rinsing, drying)?: A Lot Help from another person to put on and taking off regular upper body clothing?: A Lot Help from another person to put on and taking off regular lower body clothing?: Total 6 Click Score: 12   End of Session Nurse Communication: Mobility status  Activity Tolerance: Patient limited by fatigue;Patient tolerated treatment well Patient left: in bed;with call bell/phone within reach;with bed alarm set  OT Visit Diagnosis: Muscle weakness (generalized) (M62.81);Pain                Time: 8482-8464 OT Time Calculation (min): 18 min Charges:  OT General Charges $OT Visit: 1 Visit OT Evaluation $OT Eval Low Complexity: 1 Low  Margarie Rockey HERO., OTR/L, MA Acute Rehab [phone]   Margarie FORBES Horns 09/06/2024, 6:59 PM

## 2024-09-06 NOTE — Progress Notes (Signed)
 PROGRESS NOTE    [name]  FMW:[id number] DOB: 1933-07-16 DOA: 09/03/2024 PCP: Pcp, No  89/F SNF resident, with complete heart block, PPM, permanent A-fib on Eliquis , chronic combined CHF, OSA on 2 L O2, breast cancer treated with antiestrogen therapy, CKD 3B, CVA, type 2 diabetes mellitus, hypertension, DVT, GERD presented to the ED with worsening dyspnea, orthopnea cough PND and edema.  Allegedly on torsemide  at baseline, reports compliance - In the ER she was hypertensive, hemoglobin 8.9, no leukocytosis, creatinine 1.3, BNP 383, flu COVID and RSV PCR were negative, chest x-ray noted cardiomegaly pulmonary vascular congestion   Subjective: - Feels much better, breathing is improving  Assessment and Plan:  Acute on chronic HFmrEF Pulmonary hypertension, RV failure -Last echo 3/24 noted EF 45-50%, moderately reduced RV, severely elevated PASP, moderate mitral regurgitation -Presenting with worsening dyspnea on exertion, orthopnea and edema -Repeat echo with a EF down to 30-35%, moderate LVH, grade 3 DD, mildly reduced RV, moderately elevated PASP, will request cards eval - Improving with diuresis, 3 L negative at least,, volume status improving, cards following, transition to oral diuretics soon -Poor candidate for SGLT2i - Continue Aldactone, starting low-dose BiDil  - Discharge planning, back to SNF soon, has follow-up in advanced heart failure TOC clinic on 11/19  Recent Chest pain -Reports having an episode last week, since resolved, troponin 55, do not suspect ACS  History of CHB, PPM -Follow-up with the EP   CKD stage IIIb Creatinine at baseline, monitor    Chronic anemia Hemoglobin at baseline.  Poorly controlled type 2 diabetes: Glucose in the 260s.  Last hemoglobin A1c 10.2 in April 2023, - Continue glargine, increase dose, meal coverage  Paroxysmal A-fib -continue amiodarone  and Eliquis   History of CVA -Continue Eliquis     DVT prophylaxis: Eliquis  Code  Status: DNR Family Communication: No family at bedside Disposition Plan: Back to [name] for long-term care, likely this weekend  Consultants:    Procedures:   Antimicrobials:    Objective: Vitals:   09/06/24 0040 09/06/24 0243 09/06/24 0649 09/06/24 0821  BP: (!) 153/67 (!) 154/65  (!) 148/74  Pulse: 72 65  60  Resp:  16  15  Temp: 97.9 F (36.6 C) 98.5 F (36.9 C)  98.7 F (37.1 C)  TempSrc: Oral Oral  Oral  SpO2:  97%  96%  Weight:   82.9 kg   Height:        Intake/Output Summary (Last 24 hours) at 09/06/2024 1108 Last data filed at 09/05/2024 2203 Gross per 24 hour  Intake 120 ml  Output 750 ml  Net -630 ml   Filed Weights   09/03/24 1346 09/06/24 0649  Weight: 89.8 kg 82.9 kg    Examination:  General exam: Appears calm and comfortable, AO x 2, no distress HEENT: Positive JVD Respiratory system: Decreased breath sounds at the bases, rare basilar rales Cardiovascular system: S1 & S2 heard, RRR.  Abd: nondistended, soft and nontender.Normal bowel sounds heard. Central nervous system: Alert and oriented. No focal neurological deficits. Extremities: Trace edema Skin: No rashes Psychiatry:  Mood & affect appropriate.     Data Reviewed:   CBC: Recent Labs  Lab 09/03/24 1633  WBC 4.4  NEUTROABS 2.8  HGB 10.9*  HCT 35.9*  MCV 92.5  PLT 161   Basic Metabolic Panel: Recent Labs  Lab 09/03/24 1633 09/04/24 0627 09/05/24 0328 09/06/24 0241  NA 143 145 141 138  K 3.9 3.4* 3.6 3.6  CL 100 102 102 101  CO2 28 28 26 26   GLUCOSE 267* 227* 172* 205*  BUN 18 15 14 20   CREATININE 1.38* 1.21* 1.41* 1.58*  CALCIUM  8.7* 8.5* 8.6* 8.5*   GFR: Estimated Creatinine Clearance: 25.7 mL/min (A) (by C-G formula based on SCr of 1.58 mg/dL (H)). Liver Function Tests: Recent Labs  Lab 09/04/24 0627  AST 17  ALT 13  ALKPHOS 63  BILITOT 0.8  PROT 6.6  ALBUMIN  3.2*   No results for input(s): LIPASE, AMYLASE in the last 168 hours. No  results for input(s): AMMONIA in the last 168 hours. Coagulation Profile: No results for input(s): INR, PROTIME in the last 168 hours. Cardiac Enzymes: No results for input(s): CKTOTAL, CKMB, CKMBINDEX, TROPONINI in the last 168 hours. BNP (last 3 results) No results for input(s): PROBNP in the last 8760 hours. HbA1C: Recent Labs    09/04/24 0305  HGBA1C 9.8*   CBG: Recent Labs  Lab 09/05/24 0618 09/05/24 1144 09/05/24 1710 09/05/24 2121 09/06/24 0637  GLUCAP 176* 320* 78 223* 201*   Lipid Profile: No results for input(s): CHOL, HDL, LDLCALC, TRIG, CHOLHDL, LDLDIRECT in the last 72 hours. Thyroid  Function Tests: No results for input(s): TSH, T4TOTAL, FREET4, T3FREE, THYROIDAB in the last 72 hours. Anemia Panel: No results for input(s): VITAMINB12, FOLATE, FERRITIN, TIBC, IRON , RETICCTPCT in the last 72 hours. Urine analysis:    Component Value Date/Time   COLORURINE YELLOW 06/03/2023 0526   APPEARANCEUR CLEAR 06/03/2023 0526   LABSPEC 1.012 06/03/2023 0526   PHURINE 5.0 06/03/2023 0526   GLUCOSEU 50 (A) 06/03/2023 0526   HGBUR NEGATIVE 06/03/2023 0526   BILIRUBINUR NEGATIVE 06/03/2023 0526   BILIRUBINUR negative 11/28/2019 1206   KETONESUR NEGATIVE 06/03/2023 0526   PROTEINUR NEGATIVE 06/03/2023 0526   UROBILINOGEN 0.2 11/28/2019 1206   UROBILINOGEN 0.2 10/12/2012 2037   NITRITE NEGATIVE 06/03/2023 0526   LEUKOCYTESUR TRACE (A) 06/03/2023 0526   Sepsis Labs: @LABRCNTIP (procalcitonin:4,lacticidven:4)  ) Recent Results (from the past 240 hours)  Resp panel by RT-PCR (RSV, Flu A&B, Covid) Anterior Nasal Swab     Status: None   Collection Time: 09/03/24  1:50 PM   Specimen: Anterior Nasal Swab  Result Value Ref Range Status   SARS Coronavirus 2 by RT PCR NEGATIVE NEGATIVE Final   Influenza A by PCR NEGATIVE NEGATIVE Final   Influenza B by PCR NEGATIVE NEGATIVE Final    Comment: (NOTE) The Xpert Xpress  SARS-CoV-2/FLU/RSV plus assay is intended as an aid in the diagnosis of influenza from Nasopharyngeal swab specimens and should not be used as a sole basis for treatment. Nasal washings and aspirates are unacceptable for Xpert Xpress SARS-CoV-2/FLU/RSV testing.  Fact Sheet for Patients: bloggercourse.com  Fact Sheet for Healthcare Providers: seriousbroker.it  This test is not yet approved or cleared by the [place]  FDA and has been authorized for detection and/or diagnosis of SARS-CoV-2 by FDA under an Emergency Use Authorization (EUA). This EUA will remain in effect (meaning this test can be used) for the duration of the COVID-19 declaration under Section 564(b)(1) of the Act, 21 U.S.C. section 360bbb-3(b)(1), unless the authorization is terminated or revoked.     Resp Syncytial Virus by PCR NEGATIVE NEGATIVE Final    Comment: (NOTE) Fact Sheet for Patients: bloggercourse.com  Fact Sheet for Healthcare Providers: seriousbroker.it  This test is not yet approved or cleared by the [place]  FDA and has been authorized for detection and/or diagnosis of SARS-CoV-2 by FDA under an Emergency Use Authorization (EUA). This EUA will remain in effect (  meaning this test can be used) for the duration of the COVID-19 declaration under Section 564(b)(1) of the Act, 21 U.S.C. section 360bbb-3(b)(1), unless the authorization is terminated or revoked.  Performed at [facility], [address] [place], [place] [address]      Radiology Studies: No results found.    Scheduled Meds:  [START ON 09/09/2024] amiodarone   200 mg Oral Once per day on Monday Tuesday Wednesday Thursday Friday   apixaban   2.5 mg Oral BID   insulin  aspart  0-15 Units Subcutaneous TID WC   insulin  aspart  0-5 Units Subcutaneous QHS   insulin  aspart  3 Units Subcutaneous TID WC   insulin   glargine-yfgn  15 Units Subcutaneous Daily   isosorbide -hydrALAZINE   1 tablet Oral TID   lactulose   20 g Oral BID   spironolactone  25 mg Oral Daily   Continuous Infusions:   LOS: 2 days    Time spent:    Sigurd Pac, MD [facility]   09/06/2024, 11:08 AM

## 2024-09-06 NOTE — TOC Progression Note (Signed)
 Transition of Care [facility]) - Progression Note    Patient Details  Name: [name] MRN: [id number] Date of Birth: [birth year]  Transition [facility] CM/SW Contact  Montie LOISE Louder, [place] Phone Number: 09/06/2024, 1:51 PM  Clinical Narrative:     Informed [name] of possible discharge this weekend.   TOC will continue to follow and assist with discharge planning.  Montie Louder, MSW, LCSW Clinical Social Worker    Expected Discharge Plan: Skilled Nursing Facility Barriers to Discharge: Continued Medical Work up               Expected Discharge Plan and Services In-house Referral: Clinical Social Work     Living arrangements for the past 2 months: Skilled Nursing Facility                                       Social Drivers of Health (SDOH) Interventions SDOH Screenings   Food Insecurity: No Food Insecurity (09/04/2024)  Housing: Unknown (09/04/2024)  Recent Concern: Housing - High Risk (09/04/2024)  Transportation Needs: No Transportation Needs (09/04/2024)  Utilities: Not At Risk (09/04/2024)  Alcohol Screen: Low Risk  (09/05/2024)  Depression (PHQ2-9): Medium Risk (04/02/2020)  Financial Resource Strain: Low Risk  (09/05/2024)  Physical Activity: Inactive (04/02/2020)  Social Connections: Moderately Integrated (09/04/2024)  Stress: No Stress Concern Present (04/02/2020)  Tobacco Use: Low Risk  (09/03/2024)    Readmission Risk Interventions     No data to display

## 2024-09-07 DIAGNOSIS — I5023 Acute on chronic systolic (congestive) heart failure: Secondary | ICD-10-CM | POA: Diagnosis not present

## 2024-09-07 LAB — BASIC METABOLIC PANEL WITH GFR
Anion gap: 13 (ref 5–15)
BUN: 26 mg/dL — ABNORMAL HIGH (ref 8–23)
CO2: 25 mmol/L (ref 22–32)
Calcium: 8.4 mg/dL — ABNORMAL LOW (ref 8.9–10.3)
Chloride: 103 mmol/L (ref 98–111)
Creatinine, Ser: 2.15 mg/dL — ABNORMAL HIGH (ref 0.44–1.00)
GFR, Estimated: 21 mL/min — ABNORMAL LOW (ref >60)
Glucose, Bld: 164 mg/dL — ABNORMAL HIGH (ref 70–99)
Potassium: 3.7 mmol/L (ref 3.5–5.1)
Sodium: 141 mmol/L (ref 135–145)

## 2024-09-07 LAB — GLUCOSE, CAPILLARY
Glucose-Capillary: 193 mg/dL — ABNORMAL HIGH (ref 70–99)
Glucose-Capillary: 221 mg/dL — ABNORMAL HIGH (ref 70–99)
Glucose-Capillary: 77 mg/dL (ref 70–99)
Glucose-Capillary: 238 mg/dL — ABNORMAL HIGH (ref 70–99)

## 2024-09-07 MED ORDER — CARVEDILOL 6.25 MG PO TABS
6.2500 mg | ORAL_TABLET | Freq: Two times a day (BID) | ORAL | Status: DC
  Administered 2024-09-07 – 2024-09-12 (×11): 6.25 mg via ORAL
  Filled 2024-09-07 (×11): qty 1

## 2024-09-07 MED ORDER — AMLODIPINE BESYLATE 5 MG PO TABS
5.0000 mg | ORAL_TABLET | Freq: Every day | ORAL | Status: DC
  Administered 2024-09-07: 5 mg via ORAL
  Filled 2024-09-07: qty 1

## 2024-09-07 MED ORDER — SIMETHICONE 80 MG PO CHEW
80.0000 mg | CHEWABLE_TABLET | Freq: Four times a day (QID) | ORAL | Status: DC | PRN
  Administered 2024-09-07: 80 mg via ORAL
  Filled 2024-09-07: qty 1

## 2024-09-07 NOTE — Progress Notes (Addendum)
 PROGRESS NOTE    [name]  FMW:[id number] DOB: 06-07-1933 DOA: 09/03/2024 PCP: Pcp, No  89/F SNF resident, with complete heart block, PPM, permanent A-fib on Eliquis , chronic combined CHF, OSA on 2 L O2, breast cancer treated with antiestrogen therapy, CKD 3B, CVA, type 2 diabetes mellitus, hypertension, DVT, GERD presented to the ED with worsening dyspnea, orthopnea cough PND and edema.  Allegedly on torsemide  at baseline, reports compliance - In the ER she was hypertensive, hemoglobin 8.9, no leukocytosis, creatinine 1.3, BNP 383, flu COVID and RSV PCR were negative, chest x-ray noted cardiomegaly pulmonary vascular congestion - Admitted, started on diuretics, echo noted worsening cardiomyopathy, cards consulted - 11/15, creatinine worse, diuretics held  Subjective: - Feels fair, no events overnight, breathing continues to improve  Assessment and Plan:  Acute on chronic HFmrEF Pulmonary hypertension, RV failure -Last echo 3/24 noted EF 45-50%, moderately reduced RV, severely elevated PASP, moderate mitral regurgitation -Presenting with worsening dyspnea on exertion, orthopnea and edema -Repeat echo with a EF down to 30-35%, moderate LVH, grade 3 DD, mildly reduced RV, moderately elevated PASP, appreciate cards input - Overall improving with diuresis, urine output inaccurate, weight down 16 LB, creatinine has bumped up to 2.1 this morning, hold further diuretics today - Continue Coreg , Aldactone, BiDil , poor candidate for SGLT2i - Discharge planning, back to SNF soon if creatinine stabilizes, has follow-up in advanced heart failure TOC clinic on 11/19  Recent Chest pain -Reports having an episode last week, since resolved, troponin 55, do not suspect ACS  History of CHB, PPM -Follow-up with the EP   AKI on CKD stage IIIb -See discussion above, holding diuretics today   Chronic anemia Hemoglobin at baseline.  Poorly controlled type 2 diabetes: Glucose in the 260s.  Last  hemoglobin A1c 10.2 in April 2023, - Continue glargine, increase dose, meal coverage  Paroxysmal A-fib -continue amiodarone  and Eliquis   History of CVA -Continue Eliquis     DVT prophylaxis: Eliquis  Code Status: DNR Family Communication: No family at bedside, called and updated granddaughter from patient's room Disposition Plan: [facility] to [name] for [facility]-term care, likely this weekend  Consultants:    Procedures:   Antimicrobials:    Objective: Vitals:   09/07/24 0313 09/07/24 0500 09/07/24 0726 09/07/24 0835  BP: 121/79  (!) 140/79 122/64  Pulse: 76  70   Resp: 16  20   Temp: 97.7 F (36.5 C)  98.2 F (36.8 C)   TempSrc: Oral  Oral   SpO2: 98%  95%   Weight:  83 kg    Height:        Intake/Output Summary (Last 24 hours) at 09/07/2024 1042 Last data filed at 09/07/2024 0511 Gross per 24 hour  Intake --  Output 450 ml  Net -450 ml   Filed Weights   09/03/24 1346 09/06/24 0649 09/07/24 0500  Weight: 89.8 kg 82.9 kg 83 kg    Examination:  General exam: Appears calm and comfortable, AO x 2, no distress HEENT: No JVD Respiratory system: Decreased breath sounds at the bases, rare basilar rales Cardiovascular system: S1 & S2 heard, RRR.  Abd: nondistended, soft and nontender.Normal bowel sounds heard. Central nervous system: Alert and oriented. No focal neurological deficits. Extremities: Trace edema Skin: No rashes Psychiatry:  Mood & affect appropriate.     Data Reviewed:   CBC: Recent Labs  Lab 09/03/24 1633  WBC 4.4  NEUTROABS 2.8  HGB 10.9*  HCT 35.9*  MCV 92.5  PLT 161  Basic Metabolic Panel: Recent Labs  Lab 09/03/24 1633 09/04/24 0627 09/05/24 0328 09/06/24 0241 09/07/24 0302  NA 143 145 141 138 141  K 3.9 3.4* 3.6 3.6 3.7  CL 100 102 102 101 103  CO2 28 28 26 26 25   GLUCOSE 267* 227* 172* 205* 164*  BUN 18 15 14 20  26*  CREATININE 1.38* 1.21* 1.41* 1.58* 2.15*  CALCIUM  8.7* 8.5* 8.6* 8.5* 8.4*   GFR: Estimated  Creatinine Clearance: 18.9 mL/min (A) (by C-G formula based on SCr of 2.15 mg/dL (H)). Liver Function Tests: Recent Labs  Lab 09/04/24 0627  AST 17  ALT 13  ALKPHOS 63  BILITOT 0.8  PROT 6.6  ALBUMIN  3.2*   No results for input(s): LIPASE, AMYLASE in the last 168 hours. No results for input(s): AMMONIA in the last 168 hours. Coagulation Profile: No results for input(s): INR, PROTIME in the last 168 hours. Cardiac Enzymes: No results for input(s): CKTOTAL, CKMB, CKMBINDEX, TROPONINI in the last 168 hours. BNP (last 3 results) No results for input(s): PROBNP in the last 8760 hours. HbA1C: No results for input(s): HGBA1C in the last 72 hours.  CBG: Recent Labs  Lab 09/06/24 0637 09/06/24 1157 09/06/24 1712 09/06/24 2105 09/07/24 0552  GLUCAP 201* 223* 106* 140* 193*   Lipid Profile: No results for input(s): CHOL, HDL, LDLCALC, TRIG, CHOLHDL, LDLDIRECT in the last 72 hours. Thyroid  Function Tests: No results for input(s): TSH, T4TOTAL, FREET4, T3FREE, THYROIDAB in the last 72 hours. Anemia Panel: No results for input(s): VITAMINB12, FOLATE, FERRITIN, TIBC, IRON , RETICCTPCT in the last 72 hours. Urine analysis:    Component Value Date/Time   COLORURINE YELLOW 06/03/2023 0526   APPEARANCEUR CLEAR 06/03/2023 0526   LABSPEC 1.012 06/03/2023 0526   PHURINE 5.0 06/03/2023 0526   GLUCOSEU 50 (A) 06/03/2023 0526   HGBUR NEGATIVE 06/03/2023 0526   BILIRUBINUR NEGATIVE 06/03/2023 0526   BILIRUBINUR negative 11/28/2019 1206   KETONESUR NEGATIVE 06/03/2023 0526   PROTEINUR NEGATIVE 06/03/2023 0526   UROBILINOGEN 0.2 11/28/2019 1206   UROBILINOGEN 0.2 10/12/2012 2037   NITRITE NEGATIVE 06/03/2023 0526   LEUKOCYTESUR TRACE (A) 06/03/2023 0526   Sepsis Labs: @LABRCNTIP (procalcitonin:4,lacticidven:4)  ) Recent Results (from the past 240 hours)  Resp panel by RT-PCR (RSV, Flu A&B, Covid) Anterior Nasal Swab     Status:  None   Collection Time: 09/03/24  1:50 PM   Specimen: Anterior Nasal Swab  Result Value Ref Range Status   SARS Coronavirus 2 by RT PCR NEGATIVE NEGATIVE Final   Influenza A by PCR NEGATIVE NEGATIVE Final   Influenza B by PCR NEGATIVE NEGATIVE Final    Comment: (NOTE) The Xpert Xpress SARS-CoV-2/FLU/RSV plus assay is intended as an aid in the diagnosis of influenza from Nasopharyngeal swab specimens and should not be used as a sole basis for treatment. Nasal washings and aspirates are unacceptable for Xpert Xpress SARS-CoV-2/FLU/RSV testing.  Fact Sheet for Patients: bloggercourse.com  Fact Sheet for Healthcare Providers: seriousbroker.it  This test is not yet approved or cleared by the [place]  FDA and has been authorized for detection and/or diagnosis of SARS-CoV-2 by FDA under an Emergency Use Authorization (EUA). This EUA will remain in effect (meaning this test can be used) for the duration of the COVID-19 declaration under Section 564(b)(1) of the Act, 21 U.S.C. section 360bbb-3(b)(1), unless the authorization is terminated or revoked.     Resp Syncytial Virus by PCR NEGATIVE NEGATIVE Final    Comment: (NOTE) Fact Sheet for Patients: bloggercourse.com  Fact  Sheet for Healthcare Providers: seriousbroker.it  This test is not yet approved or cleared by the [place]  FDA and has been authorized for detection and/or diagnosis of SARS-CoV-2 by FDA under an Emergency Use Authorization (EUA). This EUA will remain in effect (meaning this test can be used) for the duration of the COVID-19 declaration under Section 564(b)(1) of the Act, 21 U.S.C. section 360bbb-3(b)(1), unless the authorization is terminated or revoked.  Performed at [facility], [address], [place], [place] [address]      Radiology Studies: No results found.    Scheduled Meds:   [START ON 09/09/2024] amiodarone   200 mg Oral Once per day on Monday Tuesday Wednesday Thursday Friday   apixaban   2.5 mg Oral BID   carvedilol   6.25 mg Oral BID WC   insulin  aspart  0-15 Units Subcutaneous TID WC   insulin  aspart  0-5 Units Subcutaneous QHS   insulin  aspart  3 Units Subcutaneous TID WC   insulin  glargine-yfgn  15 Units Subcutaneous Daily   isosorbide -hydrALAZINE   1 tablet Oral TID   lactulose   20 g Oral BID   Continuous Infusions:   LOS: 3 days    Time spent:    Sigurd Pac, MD [facility]   09/07/2024, 10:42 AM

## 2024-09-07 NOTE — Progress Notes (Signed)
 Rounding Note   Patient Name: [name] Date of Encounter: 09/07/2024  [facility] Cardiologist: Oneil Parchment, MD   Subjective - No acute events overnight - Patient says that she feels a lot better than when she first came in and that her breathing has improved. - Renal function continues to worsen.  Scheduled Meds:  [START ON 09/09/2024] amiodarone   200 mg Oral Once per day on Monday Tuesday Wednesday Thursday Friday   apixaban   2.5 mg Oral BID   carvedilol   6.25 mg Oral BID WC   insulin  aspart  0-15 Units Subcutaneous TID WC   insulin  aspart  0-5 Units Subcutaneous QHS   insulin  aspart  3 Units Subcutaneous TID WC   insulin  glargine-yfgn  15 Units Subcutaneous Daily   isosorbide -hydrALAZINE   1 tablet Oral TID   lactulose   20 g Oral BID   Continuous Infusions:  PRN Meds: acetaminophen  **OR** acetaminophen , ondansetron  (ZOFRAN ) IV   Vital Signs  Vitals:   09/07/24 0726 09/07/24 0835 09/07/24 1108 09/07/24 1112  BP: (!) 140/79 122/64 (!) 147/66 (!) 147/66  Pulse: 70  73 73  Resp: 20  20   Temp: 98.2 F (36.8 C)  98.2 F (36.8 C)   TempSrc: Oral  Oral   SpO2: 95%  98%   Weight:      Height:        Intake/Output Summary (Last 24 hours) at 09/07/2024 1504 Last data filed at 09/07/2024 1215 Gross per 24 hour  Intake 480 ml  Output 800 ml  Net -320 ml      09/07/2024    5:00 AM 09/06/2024    6:49 AM 09/03/2024    1:46 PM  Last 3 Weights  Weight (lbs) 182 lb 15.7 oz 182 lb 12.2 oz 198 lb  Weight (kg) 83 kg 82.9 kg 89.812 kg      Telemetry Atrial fibrillation is rate controlled, LBBB- Personally Reviewed  ECG  No new ECG- Personally Reviewed  Physical Exam  GEN: No acute distress.   Neck: No JVD Cardiac: Regular rate with irregularly irregular rhythm, no murmurs, rubs, or gallops.  Respiratory: Faint bibasilar rales, no wheezes or rhonchi GI: Soft, nontender, non-distended  MS: No edema; No deformity. Neuro:  Nonfocal  Psych: Normal  affect   Labs High Sensitivity Troponin:   Recent Labs  Lab 09/04/24 0305  TROPONINIHS 55*     Chemistry Recent Labs  Lab 09/04/24 0627 09/05/24 0328 09/06/24 0241 09/07/24 0302  NA 145 141 138 141  K 3.4* 3.6 3.6 3.7  CL 102 102 101 103  CO2 28 26 26 25   GLUCOSE 227* 172* 205* 164*  BUN 15 14 20  26*  CREATININE 1.21* 1.41* 1.58* 2.15*  CALCIUM  8.5* 8.6* 8.5* 8.4*  PROT 6.6  --   --   --   ALBUMIN  3.2*  --   --   --   AST 17  --   --   --   ALT 13  --   --   --   ALKPHOS 63  --   --   --   BILITOT 0.8  --   --   --   GFRNONAA 43* 36* 31* 21*  ANIONGAP 15 13 11 13     Lipids No results for input(s): CHOL, TRIG, HDL, LABVLDL, LDLCALC, CHOLHDL in the last 168 hours.  Hematology Recent Labs  Lab 09/03/24 1633  WBC 4.4  RBC 3.88  HGB 10.9*  HCT 35.9*  MCV 92.5  MCH  28.1  MCHC 30.4  RDW 15.1  PLT 161   Thyroid  No results for input(s): TSH, FREET4 in the last 168 hours.  BNP Recent Labs  Lab 09/03/24 1633  BNP 383.6*    DDimer No results for input(s): DDIMER in the last 168 hours.   Radiology  No results found.  Cardiac Studies  TTE 09/04/24:  IMPRESSIONS     1. Left ventricular ejection fraction, by estimation, is 30 to 35%. The  left ventricle has moderately decreased function. The left ventricle has  no regional wall motion abnormalities. There is moderate concentric left  ventricular hypertrophy. Left  ventricular diastolic parameters are consistent with Grade III diastolic  dysfunction (restrictive).   2. Right ventricular systolic function is mildly reduced. The right  ventricular size is normal. There is moderately elevated pulmonary artery  systolic pressure. The estimated right ventricular systolic pressure is  49.6 mmHg.   3. Left atrial size was severely dilated.   4. A small pericardial effusion is present.   5. The mitral valve is normal in structure. Mild mitral valve  regurgitation. No evidence of mitral  stenosis.   6. The aortic valve is tricuspid. Aortic valve regurgitation is not  visualized. No aortic stenosis is present.   7. The inferior vena cava is dilated in size with <50% respiratory  variability, suggesting right atrial pressure of 15 mmHg.   Patient Profile   [name] is a 89 y.o. female with a hx of permanent A-fib, SVT, complete heart block s/p PPM, known LBBB, chronic combined CHF, history of DVT on Eliquis , hypertension, CKD stage 3b, chronic anemia/thrombocytopenia, diabetes,  who is being seen 09/05/2024 for the evaluation of acute on chronic CHF at the request of Dr. Fairy.   Assessment & Plan   #Acute on Chronic HFrEF Exacerbation (EF = 30-35%) - Patient admitted with decompensated CHF and SOB. - Since diuresis, the patient has improved substantially from a respiratory standpoint. - She was started on spironolactone and diuretic, but her creatinine has since worsened. - She appears mostly euvolemic by physical exam apart from some inspiratory crackles at the bases. - I will start her on her home dose of carvedilol  today but will hold spironolactone and any other potentially nephrotoxic medications until her creatinine stabilizes. - I shared with the patient that she may need an RHC to better understand her filling pressures if her creatinine continues to worsen. Hold diuresis today Hold spironolactone Start carvedilol  6.25 mg twice daily Continue isosorbide /hydralazine  1 tablet 3 times daily Consider RHC pending the progression of her AKI  #Permanent Atrial Fibrillation - Currently in rate controlled atrial fibrillation is permanent without symptoms. Continue Eliquis  2.5 mg twice daily Starting Coreg  as above  #AKI on CKD - Her creatinine was 1.21 during this hospitalization and is now 2.15. - She appears euvolemic on exam, so wondering if she is now dry. - Holding diuresis as described above. - May ultimately need an RHC to better understand her filling  pressures. Avoid potentially nephrotoxic medications  #HTN Restart amlodipine  today      For questions or updates, please contact [facility] Please consult [email or web address] for contact info under       Signed, Georganna Archer, MD  09/07/2024, 3:04 PM

## 2024-09-08 DIAGNOSIS — I5023 Acute on chronic systolic (congestive) heart failure: Secondary | ICD-10-CM | POA: Diagnosis not present

## 2024-09-08 LAB — BASIC METABOLIC PANEL WITH GFR
Anion gap: 13 (ref 5–15)
BUN: 31 mg/dL — ABNORMAL HIGH (ref 8–23)
CO2: 26 mmol/L (ref 22–32)
Calcium: 8.7 mg/dL — ABNORMAL LOW (ref 8.9–10.3)
Chloride: 101 mmol/L (ref 98–111)
Creatinine, Ser: 2.28 mg/dL — ABNORMAL HIGH (ref 0.44–1.00)
GFR, Estimated: 20 mL/min — ABNORMAL LOW (ref >60)
Glucose, Bld: 183 mg/dL — ABNORMAL HIGH (ref 70–99)
Potassium: 4.3 mmol/L (ref 3.5–5.1)
Sodium: 140 mmol/L (ref 135–145)

## 2024-09-08 LAB — GLUCOSE, CAPILLARY
Glucose-Capillary: 267 mg/dL — ABNORMAL HIGH (ref 70–99)
Glucose-Capillary: 208 mg/dL — ABNORMAL HIGH (ref 70–99)
Glucose-Capillary: 165 mg/dL — ABNORMAL HIGH (ref 70–99)
Glucose-Capillary: 121 mg/dL — ABNORMAL HIGH (ref 70–99)

## 2024-09-08 MED ORDER — SIMETHICONE 80 MG PO CHEW
80.0000 mg | CHEWABLE_TABLET | Freq: Four times a day (QID) | ORAL | Status: DC
  Administered 2024-09-08 – 2024-09-12 (×15): 80 mg via ORAL
  Filled 2024-09-08 (×15): qty 1

## 2024-09-08 MED ORDER — PREGABALIN 25 MG PO CAPS
25.0000 mg | ORAL_CAPSULE | Freq: Two times a day (BID) | ORAL | Status: DC
  Administered 2024-09-08 – 2024-09-12 (×9): 25 mg via ORAL
  Filled 2024-09-08 (×9): qty 1

## 2024-09-08 MED ORDER — FUROSEMIDE 10 MG/ML IJ SOLN
80.0000 mg | Freq: Once | INTRAMUSCULAR | Status: AC
  Administered 2024-09-08: 80 mg via INTRAVENOUS
  Filled 2024-09-08: qty 8

## 2024-09-08 MED ORDER — PREGABALIN 75 MG PO CAPS: 75.0000 mg | ORAL_CAPSULE | Freq: Every day | ORAL | Status: DC

## 2024-09-08 MED ORDER — MIRABEGRON ER 25 MG PO TB24
25.0000 mg | ORAL_TABLET | Freq: Every day | ORAL | Status: DC
  Administered 2024-09-08 – 2024-09-12 (×5): 25 mg via ORAL
  Filled 2024-09-08 (×5): qty 1

## 2024-09-08 MED ORDER — PANTOPRAZOLE SODIUM 40 MG PO TBEC
40.0000 mg | DELAYED_RELEASE_TABLET | Freq: Every day | ORAL | Status: DC
  Administered 2024-09-08 – 2024-09-12 (×5): 40 mg via ORAL
  Filled 2024-09-08 (×5): qty 1

## 2024-09-08 MED ORDER — PREGABALIN 25 MG PO CAPS: 50.0000 mg | ORAL_CAPSULE | Freq: Every morning | ORAL | Status: DC

## 2024-09-08 NOTE — Progress Notes (Signed)
 Mobility Specialist Progress Note:    09/08/24 1332  Mobility  Activity Pivoted/transferred to/from [facility]  Level of Assistance Minimal assist, patient does 75% or more  Assistive Device Other (Comment) (HHA)  Distance Ambulated (ft) 2 ft  Activity Response Tolerated well  Mobility Referral Yes  Mobility visit 1 Mobility  Mobility Specialist Start Time (ACUTE ONLY) 0932  Mobility Specialist Stop Time (ACUTE ONLY) 0944  Mobility Specialist Time Calculation (min) (ACUTE ONLY) 12 min   Pt received in bed agreeable to mobility. Requested to use BSC. Was able to transfer w/ MinA. Once finished on [facility] pt agreeable to transfer to chair. No c/o throughout. Left in chair w/ call bell and personal belongings in reach. All needs met.  Thersia Minder Mobility Specialist  Please contact vis Secure Chat or  Rehab Office [phone]

## 2024-09-08 NOTE — Progress Notes (Signed)
 [facility] Progress Note Patient: [name] FMW:[id number] DOB: 09-14-1933  DOA: 09/03/2024 DOS: the patient was seen and examined on 09/08/2024  Brief Hospital Course: 89/F SNF resident, with complete heart block, PPM, permanent A-fib on Eliquis , chronic combined CHF, OSA on 2 L O2, breast cancer treated with antiestrogen therapy, CKD 3B, CVA, type 2 diabetes mellitus, hypertension, DVT, GERD presented to the ED with worsening dyspnea, orthopnea cough PND and edema.  Allegedly on torsemide  at baseline, reports compliance - In the ER she was hypertensive, hemoglobin 8.9, no leukocytosis, creatinine 1.3, BNP 383, flu COVID and RSV PCR were negative, chest x-ray noted cardiomegaly pulmonary vascular congestion - Admitted, started on diuretics, echo noted worsening cardiomyopathy, cards consulted Assessment and Plan: Acute on chronic HFmrEF Pulmonary hypertension, RV failure -Last echo 3/24 noted EF 45-50%, moderately reduced RV, severely elevated PASP, moderate mitral regurgitation -Presenting with worsening dyspnea on exertion, orthopnea and edema -Repeat echo with a EF down to 30-35%, moderate LVH, grade 3 DD, mildly reduced RV, moderately elevated PASP, appreciate cards input - Overall improving with diuresis, urine output inaccurate, weight down 16 LB, creatinine has bumped up to 2.1 this morning, hold further diuretics today - Continue Coreg , Aldactone, BiDil , poor candidate for SGLT2i Continuing diuresis.   Recent Chest pain -Reports having an episode last week, since resolved, troponin 55, do not suspect ACS   History of CHB, PPM -Follow-up with the EP   AKI on CKD stage IIIb -See discussion above, holding diuretics today   Chronic anemia Hemoglobin at baseline.   Poorly controlled type 2 diabetes: Glucose in the 260s.  Last hemoglobin A1c 10.2 in April 2023, - Continue glargine, increase dose, meal coverage   Paroxysmal A-fib -continue amiodarone  and Eliquis     History of CVA -Continue Eliquis   Subjective: Ongoing shortness of breath.  No nausea no vomiting right now but reports some episode of vomiting last night.  No fever no chills. Also reports bloating. Physical Exam: Clear to auscultation Bowel sounds present nontender. Bilateral edema.  Data Reviewed: I have Reviewed nursing notes, Vitals, and Lab results. Since last encounter, pertinent lab results CBC and BMP   . I have ordered test including CBC and BMP  .   Disposition: Status is: Inpatient Remains inpatient appropriate because: Monitor for improvement in respiratory status  apixaban  (ELIQUIS ) tablet 2.5 mg Start: 09/04/24 1000 apixaban  (ELIQUIS ) tablet 2.5 mg   Family Communication: No one at bedside Level of care: Progressive   Vitals:   09/08/24 0947 09/08/24 1201 09/08/24 1656 09/08/24 1714  BP: 113/60 104/70 130/71 130/71  Pulse: 62 66 61 62  Resp: 20 18 20    Temp:  97.8 F (36.6 C) 97.8 F (36.6 C)   TempSrc:  Oral Oral   SpO2:  98% 99%   Weight:      Height:         Author: Yetta Blanch, MD 09/08/2024 6:59 PM  Please look on [email or web address] to find out who is on call.

## 2024-09-08 NOTE — Significant Event (Signed)
 I asked the patient earlier today if she would be willing to undergo RHC given her worsening renal failure. She requested that I speak with her daughter [name] first before deciding. I attempted to call [name] earlier in the day and once just now, but sadly was unable to get in contact with her. I will keep the patient NPO at midnight in the off chance she is consented before tomorrow. I will also give her IV lasix  today since she appears maybe slightly volume up today compared to yesterday.   [name] HEATH, MD [place]  [facility]  09/08/2024 5:13 PM

## 2024-09-08 NOTE — Progress Notes (Signed)
 Rounding Note   Patient Name: [name] Date of Encounter: 09/08/2024   [facility] Cardiologist: Oneil Parchment, MD   Subjective - Patient said that she became nauseous overnight and vomited once.  Currently does not have any GI symptoms that she feels better. - Overall feels more fatigued, but denies chest pain and SOB. - Creatinine continues to worsen.  Scheduled Meds:  [START ON 09/09/2024] amiodarone   200 mg Oral Once per day on Monday Tuesday Wednesday Thursday Friday   apixaban   2.5 mg Oral BID   carvedilol   6.25 mg Oral BID WC   insulin  aspart  0-15 Units Subcutaneous TID WC   insulin  aspart  0-5 Units Subcutaneous QHS   insulin  aspart  3 Units Subcutaneous TID WC   insulin  glargine-yfgn  15 Units Subcutaneous Daily   isosorbide -hydrALAZINE   1 tablet Oral TID   lactulose   20 g Oral BID   Continuous Infusions:  PRN Meds: acetaminophen  **OR** acetaminophen , ondansetron  (ZOFRAN ) IV, simethicone    Vital Signs  Vitals:   09/08/24 0327 09/08/24 0625 09/08/24 0738 09/08/24 0810  BP: 115/66  (!) 105/58 118/62  Pulse: 68  64 62  Resp: 20  18   Temp: 97.6 F (36.4 C)  97.8 F (36.6 C) (!) 97.4 F (36.3 C)  TempSrc: Oral  Oral Oral  SpO2: 98%  100% 100%  Weight:  86.4 kg    Height:        Intake/Output Summary (Last 24 hours) at 09/08/2024 0927 Last data filed at 09/08/2024 0016 Gross per 24 hour  Intake 720 ml  Output 400 ml  Net 320 ml      09/08/2024    6:25 AM 09/07/2024    5:00 AM 09/06/2024    6:49 AM  Last 3 Weights  Weight (lbs) 190 lb 7.6 oz 182 lb 15.7 oz 182 lb 12.2 oz  Weight (kg) 86.4 kg 83 kg 82.9 kg      Telemetry Atrial fibrillation is rate controlled, LBBB- Personally Reviewed  ECG  No new ECG- Personally Reviewed  Physical Exam  GEN: No acute distress.   Neck: JVD 1 cm above clavicle while sitting upright Cardiac: Regular rate with irregularly irregular rhythm, no murmurs, rubs, or gallops.  Respiratory: CTAB, no  wheezes, rales or rhonchi GI: Soft, nontender, non-distended  MS: No edema; No deformity. Neuro:  Nonfocal  Psych: Normal affect   Labs High Sensitivity Troponin:   Recent Labs  Lab 09/04/24 0305  TROPONINIHS 55*     Chemistry Recent Labs  Lab 09/04/24 0627 09/05/24 0328 09/06/24 0241 09/07/24 0302 09/08/24 0339  NA 145   < > 138 141 140  K 3.4*   < > 3.6 3.7 4.3  CL 102   < > 101 103 101  CO2 28   < > 26 25 26   GLUCOSE 227*   < > 205* 164* 183*  BUN 15   < > 20 26* 31*  CREATININE 1.21*   < > 1.58* 2.15* 2.28*  CALCIUM  8.5*   < > 8.5* 8.4* 8.7*  PROT 6.6  --   --   --   --   ALBUMIN  3.2*  --   --   --   --   AST 17  --   --   --   --   ALT 13  --   --   --   --   ALKPHOS 63  --   --   --   --  BILITOT 0.8  --   --   --   --   GFRNONAA 43*   < > 31* 21* 20*  ANIONGAP 15   < > 11 13 13    < > = values in this interval not displayed.    Lipids No results for input(s): CHOL, TRIG, HDL, LABVLDL, LDLCALC, CHOLHDL in the last 168 hours.  Hematology Recent Labs  Lab 09/03/24 1633  WBC 4.4  RBC 3.88  HGB 10.9*  HCT 35.9*  MCV 92.5  MCH 28.1  MCHC 30.4  RDW 15.1  PLT 161   Thyroid  No results for input(s): TSH, FREET4 in the last 168 hours.  BNP Recent Labs  Lab 09/03/24 1633  BNP 383.6*    DDimer No results for input(s): DDIMER in the last 168 hours.   Radiology  No results found.  Cardiac Studies  TTE 09/04/24:  IMPRESSIONS     1. Left ventricular ejection fraction, by estimation, is 30 to 35%. The  left ventricle has moderately decreased function. The left ventricle has  no regional wall motion abnormalities. There is moderate concentric left  ventricular hypertrophy. Left  ventricular diastolic parameters are consistent with Grade III diastolic  dysfunction (restrictive).   2. Right ventricular systolic function is mildly reduced. The right  ventricular size is normal. There is moderately elevated pulmonary artery  systolic  pressure. The estimated right ventricular systolic pressure is  49.6 mmHg.   3. Left atrial size was severely dilated.   4. A small pericardial effusion is present.   5. The mitral valve is normal in structure. Mild mitral valve  regurgitation. No evidence of mitral stenosis.   6. The aortic valve is tricuspid. Aortic valve regurgitation is not  visualized. No aortic stenosis is present.   7. The inferior vena cava is dilated in size with <50% respiratory  variability, suggesting right atrial pressure of 15 mmHg.   Patient Profile   [name] is a 89 y.o. female with a hx of permanent A-fib, SVT, complete heart block s/p PPM, known LBBB, chronic combined CHF, history of DVT on Eliquis , hypertension, CKD stage 3b, chronic anemia/thrombocytopenia, diabetes,  who is being seen 09/05/2024 for the evaluation of acute on chronic CHF at the request of Dr. Fairy.   Assessment & Plan   #Acute on Chronic HFrEF Exacerbation (EF = 30-35%) - Patient admitted with decompensated CHF and SOB. - Since diuresis, the patient has improved substantially from a respiratory standpoint, but her creatinine continues to worsen. - It is really challenging to understand her volume status given that when she was diuresed her creatinine has risen and with holding her diuresis it continues to rise. - Ultimately I think the patient needs an RHC, but she is on the fence about it.  She would like for me to speak to her daughters first before deciding. - I will reach out to them today. Hold diuresis given possible RHC tomorrow Continue holding spironolactone Continue carvedilol  6.25 mg twice daily Continue isosorbide /hydralazine  1 tablet 3 times daily RHC tomorrow pending my discussion with the patient's family N.p.o. at midnight pending discussion with the patient's family  #Permanent Atrial Fibrillation - Currently in rate controlled atrial fibrillation is permanent without symptoms. Continue Eliquis  2.5 mg twice  daily Continue Coreg  as above  #AKI on CKD - Her creatinine was 1.21 during this hospitalization and is now 2.15. - She appears euvolemic on exam, so wondering if she is now dry. - Holding diuresis as described above. -  May ultimately need an RHC to better understand her filling pressures. Avoid potentially nephrotoxic medications  #HTN Amlodipine  held by primary      For questions or updates, please contact [facility] Please consult [email or web address] for contact info under       Signed, Georganna Archer, MD  09/08/2024, 9:27 AM

## 2024-09-09 DIAGNOSIS — I5023 Acute on chronic systolic (congestive) heart failure: Secondary | ICD-10-CM | POA: Diagnosis not present

## 2024-09-09 LAB — BASIC METABOLIC PANEL WITH GFR
Anion gap: 15 (ref 5–15)
BUN: 34 mg/dL — ABNORMAL HIGH (ref 8–23)
CO2: 21 mmol/L — ABNORMAL LOW (ref 22–32)
Calcium: 8.5 mg/dL — ABNORMAL LOW (ref 8.9–10.3)
Chloride: 103 mmol/L (ref 98–111)
Creatinine, Ser: 2.56 mg/dL — ABNORMAL HIGH (ref 0.44–1.00)
GFR, Estimated: 17 mL/min — ABNORMAL LOW (ref >60)
Glucose, Bld: 138 mg/dL — ABNORMAL HIGH (ref 70–99)
Potassium: 4.8 mmol/L (ref 3.5–5.1)
Sodium: 139 mmol/L (ref 135–145)

## 2024-09-09 LAB — GLUCOSE, CAPILLARY
Glucose-Capillary: 144 mg/dL — ABNORMAL HIGH (ref 70–99)
Glucose-Capillary: 178 mg/dL — ABNORMAL HIGH (ref 70–99)
Glucose-Capillary: 206 mg/dL — ABNORMAL HIGH (ref 70–99)
Glucose-Capillary: 292 mg/dL — ABNORMAL HIGH (ref 70–99)
Glucose-Capillary: 334 mg/dL — ABNORMAL HIGH (ref 70–99)

## 2024-09-09 LAB — MAGNESIUM: Magnesium: 2.5 mg/dL — ABNORMAL HIGH (ref 1.7–2.4)

## 2024-09-09 LAB — CBC
HCT: 34.3 % — ABNORMAL LOW (ref 36.0–46.0)
Hemoglobin: 10.5 g/dL — ABNORMAL LOW (ref 12.0–15.0)
MCH: 29 pg (ref 26.0–34.0)
MCHC: 30.6 g/dL (ref 30.0–36.0)
MCV: 94.8 fL (ref 80.0–100.0)
Platelets: 121 K/uL — ABNORMAL LOW (ref 150–400)
RBC: 3.62 MIL/uL — ABNORMAL LOW (ref 3.87–5.11)
RDW: 15.6 % — ABNORMAL HIGH (ref 11.5–15.5)
WBC: 4.4 K/uL (ref 4.0–10.5)
nRBC: 0 % (ref 0.0–0.2)

## 2024-09-09 MED ORDER — INSULIN GLARGINE-YFGN 100 UNIT/ML ~~LOC~~ SOLN
15.0000 [IU] | Freq: Every day | SUBCUTANEOUS | Status: DC
  Administered 2024-09-10 – 2024-09-12 (×3): 15 [IU] via SUBCUTANEOUS
  Filled 2024-09-09 (×3): qty 0.15

## 2024-09-09 MED ORDER — INSULIN ASPART 100 UNIT/ML IJ SOLN
3.0000 [IU] | Freq: Three times a day (TID) | INTRAMUSCULAR | Status: DC
  Administered 2024-09-11 – 2024-09-12 (×4): 3 [IU] via SUBCUTANEOUS
  Filled 2024-09-09 (×4): qty 3

## 2024-09-09 NOTE — Hospital Course (Addendum)
 89/F SNF resident, with complete heart block, PPM, permanent A-fib on Eliquis , chronic combined CHF, OSA on 2 L O2, breast cancer treated with antiestrogen therapy, CKD 3B, CVA, type 2 diabetes mellitus, hypertension, DVT, GERD presented to the ED with worsening dyspnea, orthopnea cough PND and edema.  Allegedly on torsemide  at baseline, reports compliance In the ER she was hypertensive, hemoglobin 8.9, no leukocytosis, creatinine 1.3, BNP 383, flu COVID and RSV PCR were negative, chest x-ray noted cardiomegaly pulmonary vascular congestion Admitted, started on diuretics, echo noted worsening cardiomyopathy, cards consulted  Assessment and Plan: Acute on chronic HFmrEF Pulmonary hypertension, RV failure Last echo 3/24 noted EF 45-50%, moderately reduced RV, severely elevated PASP, moderate mitral regurgitation Presenting with worsening dyspnea on exertion, orthopnea and edema Repeat echo with a EF down to 30-35%, moderate LVH, grade 3 DD, mildly reduced RV, moderately elevated PASP, appreciate cards input Improving with diuresis. Continue Coreg , Aldactone, BiDil , poor candidate for SGLT2i Currently holding diuresis due to worsening renal function.   Chest pain Reports having an episode last week, since resolved, troponin 55, do not suspect ACS   History of CHB, PPM Follow-up with the EP   AKI on CKD stage IIIb Baseline serum creatinine appears to be around 1.8-1.5. Currently serum creatinine is 2.5. Due to diuresis. Holding diuretics today   Chronic anemia Thrombocytopenia Hemoglobin at baseline. Platelet count low unremarkable for now.  Poorly controlled type 2 diabetes: Glucose in the 260s.  Last hemoglobin A1c 10.2 in April 2023, - Continue glargine, increase dose, meal coverage   Paroxysmal A-fib -continue amiodarone  and Eliquis    History of CVA -Continue Eliquis   Obesity Class 1 Body mass index is 31.11 kg/m.  Placing the pt at higher risk of poor  outcomes  Neuropathy. On Lyrica . Initiating at a lower dose.  Constipation. Improving with current regimen.  For now continue. Discontinue lactulose  as it can lead to bloating and nausea.

## 2024-09-09 NOTE — TOC Progression Note (Signed)
 Transition of Care [facility]) - Progression Note    Patient Details  Name: [name] MRN: [id number] Date of Birth: [birth year]  Transition of [facility]) CM/SW Contact  Luann SHAUNNA [place], [place] Phone Number: 09/09/2024, 10:38 AM  Clinical Narrative:     ICM to continue following. Plan to return [name] once medically ready.    Expected Discharge Plan: Skilled Nursing Facility Barriers to Discharge: Continued Medical Work up               Expected Discharge Plan and Services In-house Referral: Clinical Social Work     Living arrangements for the past 2 months: Skilled Nursing Facility                                       Social Drivers of Health (SDOH) Interventions SDOH Screenings   Food Insecurity: No Food Insecurity (09/04/2024)  Housing: Unknown (09/04/2024)  Recent Concern: Housing - High Risk (09/04/2024)  Transportation Needs: No Transportation Needs (09/04/2024)  Utilities: Not At Risk (09/04/2024)  Alcohol Screen: Low Risk  (09/05/2024)  Depression (PHQ2-9): Medium Risk (04/02/2020)  Financial Resource Strain: Low Risk  (09/05/2024)  Physical Activity: Inactive (04/02/2020)  Social Connections: Moderately Integrated (09/04/2024)  Stress: No Stress Concern Present (04/02/2020)  Tobacco Use: Low Risk  (09/03/2024)    Readmission Risk Interventions     No data to display

## 2024-09-09 NOTE — Progress Notes (Signed)
 Rounding Note   Patient Name: [name] Date of Encounter: 09/09/2024  [facility] Cardiologist: Oneil Parchment, MD   Subjective No CP or dyspnea  Scheduled Meds:  amiodarone   200 mg Oral Once per day on Monday Tuesday Wednesday Thursday Friday   apixaban   2.5 mg Oral BID   carvedilol   6.25 mg Oral BID WC   insulin  aspart  0-15 Units Subcutaneous TID WC   insulin  aspart  0-5 Units Subcutaneous QHS   insulin  aspart  3 Units Subcutaneous TID WC   [START ON 09/10/2024] insulin  glargine-yfgn  15 Units Subcutaneous Daily   isosorbide -hydrALAZINE   1 tablet Oral TID   mirabegron  ER  25 mg Oral Daily   pantoprazole   40 mg Oral Daily   pregabalin   25 mg Oral BID   simethicone   80 mg Oral QID   Continuous Infusions:  PRN Meds: acetaminophen  **OR** acetaminophen , ondansetron  (ZOFRAN ) IV   Vital Signs  Vitals:   09/08/24 2329 09/09/24 0322 09/09/24 0423 09/09/24 0810  BP: (!) 106/52 (!) 111/54  (!) 140/65  Pulse: 61 64  68  Resp: 10 19 18 18   Temp: 98.2 F (36.8 C) 97.9 F (36.6 C)  98.3 F (36.8 C)  TempSrc: Oral Oral  Oral  SpO2: 100% 96% 95% 100%  Weight:   84.8 kg   Height:        Intake/Output Summary (Last 24 hours) at 09/09/2024 0908 Last data filed at 09/08/2024 2030 Gross per 24 hour  Intake 600 ml  Output --  Net 600 ml      09/09/2024    4:23 AM 09/08/2024    6:25 AM 09/07/2024    5:00 AM  Last 3 Weights  Weight (lbs) 186 lb 15.2 oz 190 lb 7.6 oz 182 lb 15.7 oz  Weight (kg) 84.8 kg 86.4 kg 83 kg      Telemetry Vpaced with underlying atrial flutter - Personally Reviewed   Physical Exam  GEN: No acute distress.   Neck: positive JVD Cardiac: irregular Respiratory: Clear to auscultation bilaterally. GI: Soft, nontender, non-distended  MS: No edema Neuro:  Nonfocal  Psych: Normal affect   Labs High Sensitivity Troponin:   Recent Labs  Lab 09/04/24 0305  TROPONINIHS 55*     Chemistry Recent Labs  Lab 09/04/24 0627  09/05/24 0328 09/07/24 0302 09/08/24 0339 09/09/24 0417  NA 145   < > 141 140 139  K 3.4*   < > 3.7 4.3 4.8  CL 102   < > 103 101 103  CO2 28   < > 25 26 21*  GLUCOSE 227*   < > 164* 183* 138*  BUN 15   < > 26* 31* 34*  CREATININE 1.21*   < > 2.15* 2.28* 2.56*  CALCIUM  8.5*   < > 8.4* 8.7* 8.5*  MG  --   --   --   --  2.5*  PROT 6.6  --   --   --   --   ALBUMIN  3.2*  --   --   --   --   AST 17  --   --   --   --   ALT 13  --   --   --   --   ALKPHOS 63  --   --   --   --   BILITOT 0.8  --   --   --   --   GFRNONAA 43*   < > 21* 20* 17*  ANIONGAP 15   < > 13 13 15    < > = values in this interval not displayed.     Hematology Recent Labs  Lab 09/03/24 1633 09/09/24 0417  WBC 4.4 4.4  RBC 3.88 3.62*  HGB 10.9* 10.5*  HCT 35.9* 34.3*  MCV 92.5 94.8  MCH 28.1 29.0  MCHC 30.4 30.6  RDW 15.1 15.6*  PLT 161 121*    BNP Recent Labs  Lab 09/03/24 1633  BNP 383.6*      Patient Profile   [name] is a 89 y.o. female with a hx of permanent A-fib, SVT, complete heart block s/p PPM, known LBBB, chronic combined CHF, history of DVT on Eliquis , hypertension, CKD stage 3b, chronic anemia/thrombocytopenia, diabetes, who is being seen 09/05/2024 for the evaluation of acute on chronic CHF.  Echocardiogram this admission shows ejection fraction 30 to 35%, moderate left ventricular hypertrophy, grade 3 diastolic dysfunction, moderate pulmonary hypertension, small pericardial fusion, severe left atrial enlargement, mild mitral regurgitation.  Assessment & Plan  1 Acute on chronic combined systolic/diastolic congestive heart failure-she appears to be euvolemic on examination this morning.  Renal function continues to decline.  Will continue to hold diuretics.  2 cardiomyopathy-ejection fraction 30 to 35%.  Continue low-dose BiDil  and carvedilol .  No ARB or Entresto due to acute renal insufficiency.  3 acute kidney injury-creatinine slightly worse today.  Continue to hold diuretics  and follow.  We are trying to balance keeping patient euvolemic versus worsening renal function.  4 permanent atrial fibrillation-continue carvedilol  and apixaban .  5 hypertension-blood pressure is reasonable.  Continue present medical regimen.  6 status post pacemaker  For questions or updates, please contact [facility] Please consult [email or web address] for contact info under    Signed, Redell Shallow, MD  09/09/2024, 9:08 AM

## 2024-09-09 NOTE — Progress Notes (Signed)
 Mobility Specialist Progress Note:   09/09/24 1028  Mobility  Activity Pivoted/transferred from bed to chair  Level of Assistance Moderate assist, patient does 50-74%  Assistive Device Front wheel walker  Distance Ambulated (ft) 3 ft  Activity Response Tolerated well  Mobility Referral Yes  Mobility visit 1 Mobility  Mobility Specialist Start Time (ACUTE ONLY) 1028  Mobility Specialist Stop Time (ACUTE ONLY) 1041  Mobility Specialist Time Calculation (min) (ACUTE ONLY) 13 min   Pt received in bed, agreeable to transfer from bed to chair for linen change and bath. ModA to stand and pivot with RW. Tolerated well, asx throughout. Sitting up in chair with all needs met. Call bell in reach.   Saman Umstead Mobility Specialist Please contact via Special Educational Needs Teacher or  Rehab office at [phone]

## 2024-09-09 NOTE — Progress Notes (Addendum)
 [facility] Progress Note Patient: [name] FMW:[id number] DOB: 27-Sep-1933  DOA: 09/03/2024 DOS: the patient was seen and examined on 09/09/2024  Brief Hospital Course: 89/F SNF resident, with complete heart block, PPM, permanent A-fib on Eliquis , chronic combined CHF, OSA on 2 L O2, breast cancer treated with antiestrogen therapy, CKD 3B, CVA, type 2 diabetes mellitus, hypertension, DVT, GERD presented to the ED with worsening dyspnea, orthopnea cough PND and edema.  Allegedly on torsemide  at baseline, reports compliance In the ER she was hypertensive, hemoglobin 8.9, no leukocytosis, creatinine 1.3, BNP 383, flu COVID and RSV PCR were negative, chest x-ray noted cardiomegaly pulmonary vascular congestion Admitted, started on diuretics, echo noted worsening cardiomyopathy, cards consulted  Assessment and Plan: Acute on chronic HFmrEF Pulmonary hypertension, RV failure Last echo 3/24 noted EF 45-50%, moderately reduced RV, severely elevated PASP, moderate mitral regurgitation Presenting with worsening dyspnea on exertion, orthopnea and edema Repeat echo with a EF down to 30-35%, moderate LVH, grade 3 DD, mildly reduced RV, moderately elevated PASP, appreciate cards input Improving with diuresis. Continue Coreg , Aldactone, BiDil , poor candidate for SGLT2i Currently holding diuresis due to worsening renal function.   Chest pain Reports having an episode last week, since resolved, troponin 55, do not suspect ACS   History of CHB, PPM Follow-up with the EP   AKI on CKD stage IIIb Baseline serum creatinine appears to be around 1.8-1.5. Currently serum creatinine is 2.5. Due to diuresis. Holding diuretics today   Chronic anemia Thrombocytopenia Hemoglobin at baseline. Platelet count low unremarkable for now.  Poorly controlled type 2 diabetes: Glucose in the 260s.  Last hemoglobin A1c 10.2 in April 2023, - Continue glargine, increase dose, meal coverage   Paroxysmal  A-fib -continue amiodarone  and Eliquis    History of CVA -Continue Eliquis   Obesity Class 1 Body mass index is 31.11 kg/m.  Placing the pt at higher risk of poor outcomes  Neuropathy. On Lyrica . Initiating at a lower dose.  Constipation. Improving with current regimen.  For now continue. Discontinue lactulose  as it can lead to bloating and nausea.   Subjective: No nausea or vomiting.  Had a BM.  Passing gas.  Physical Exam: General: in Mild distress, No Rash Cardiovascular: S1 and S2 Present, No Murmur Respiratory: Good respiratory effort, Bilateral Air entry present.  Basal crackles, No wheezes Abdomen: Bowel Sound present, No tenderness Extremities: Trace edema Neuro: Alert and oriented x3, no new focal deficit   Data Reviewed: I have Reviewed nursing notes, Vitals, and Lab results. Since last encounter, pertinent lab results CBC and BMP   . I have ordered test including CBC and BMP  .  Discussed with cardiology with regards to her n.p.o. status and procedure needs.  Disposition: Status is: Inpatient Remains inpatient appropriate because: Monitor for improvement in volume status and renal function  apixaban  (ELIQUIS ) tablet 2.5 mg Start: 09/04/24 1000 apixaban  (ELIQUIS ) tablet 2.5 mg   Family Communication: No one at bedside Level of care: Progressive   Vitals:   09/09/24 0423 09/09/24 0810 09/09/24 1221 09/09/24 1634  BP:  (!) 140/65 129/78 120/62  Pulse:  68 80 72  Resp: 18 18 15 16   Temp:  98.3 F (36.8 C) 98.3 F (36.8 C) 98.3 F (36.8 C)  TempSrc:  Oral Oral Oral  SpO2: 95% 100%  100%  Weight: 84.8 kg     Height:         Author: Yetta Blanch, MD 09/09/2024 5:42 PM  Please look on [email or web address] to find out  who is on call.

## 2024-09-10 DIAGNOSIS — I5023 Acute on chronic systolic (congestive) heart failure: Secondary | ICD-10-CM | POA: Diagnosis not present

## 2024-09-10 LAB — GLUCOSE, CAPILLARY
Glucose-Capillary: 207 mg/dL — ABNORMAL HIGH (ref 70–99)
Glucose-Capillary: 395 mg/dL — ABNORMAL HIGH (ref 70–99)
Glucose-Capillary: 311 mg/dL — ABNORMAL HIGH (ref 70–99)
Glucose-Capillary: 207 mg/dL — ABNORMAL HIGH (ref 70–99)
Glucose-Capillary: 229 mg/dL — ABNORMAL HIGH (ref 70–99)

## 2024-09-10 LAB — CBC
HCT: 30.4 % — ABNORMAL LOW (ref 36.0–46.0)
Hemoglobin: 9.4 g/dL — ABNORMAL LOW (ref 12.0–15.0)
MCH: 28.8 pg (ref 26.0–34.0)
MCHC: 30.9 g/dL (ref 30.0–36.0)
MCV: 93.3 fL (ref 80.0–100.0)
Platelets: 128 K/uL — ABNORMAL LOW (ref 150–400)
RBC: 3.26 MIL/uL — ABNORMAL LOW (ref 3.87–5.11)
RDW: 15.6 % — ABNORMAL HIGH (ref 11.5–15.5)
WBC: 4 K/uL (ref 4.0–10.5)
nRBC: 0 % (ref 0.0–0.2)

## 2024-09-10 LAB — BASIC METABOLIC PANEL WITH GFR
Anion gap: 15 (ref 5–15)
BUN: 40 mg/dL — ABNORMAL HIGH (ref 8–23)
CO2: 24 mmol/L (ref 22–32)
Calcium: 8.4 mg/dL — ABNORMAL LOW (ref 8.9–10.3)
Chloride: 97 mmol/L — ABNORMAL LOW (ref 98–111)
Creatinine, Ser: 2.67 mg/dL — ABNORMAL HIGH (ref 0.44–1.00)
GFR, Estimated: 17 mL/min — ABNORMAL LOW (ref >60)
Glucose, Bld: 202 mg/dL — ABNORMAL HIGH (ref 70–99)
Potassium: 4.4 mmol/L (ref 3.5–5.1)
Sodium: 136 mmol/L (ref 135–145)

## 2024-09-10 LAB — MAGNESIUM: Magnesium: 2.4 mg/dL (ref 1.7–2.4)

## 2024-09-10 MED ORDER — GUAIFENESIN-DM 100-10 MG/5ML PO SYRP
5.0000 mL | ORAL_SOLUTION | Freq: Once | ORAL | Status: AC
  Administered 2024-09-11: 5 mL via ORAL
  Filled 2024-09-10: qty 5

## 2024-09-10 NOTE — Inpatient Diabetes Management (Signed)
 Inpatient Diabetes Program Recommendations  AACE/ADA: New Consensus Statement on Inpatient Glycemic Control   Target Ranges:  Prepandial:   less than 140 mg/dL      Peak postprandial:   less than 180 mg/dL (1-2 hours)      Critically ill patients:  140 - 180 mg/dL    Latest Reference Range & Units 09/09/24 06:12 09/09/24 12:20 09/09/24 16:47 09/09/24 21:07 09/10/24 06:26  Glucose-Capillary 70 - 99 mg/dL 821 (H) 793 (H) 665 (H) 292 (H) 207 (H)    Review of Glycemic Control  Diabetes history: DM2 Outpatient Diabetes medications: Lantus  8 units QAM, Lantus  23 units QPM, Humalog  correction scale TID Current orders for Inpatient glycemic control: Semglee  15 units daily, Novolog  3 units TID with meals, Novolog  0-15 units TID with meals, Novolog  0-5 units QHS  Inpatient Diabetes Program Recommendations:    Insulin : No Semglee  given on 09/09/24. As a result, CBGs trended higher.  Do not recommend any insulin  changes at this time.  Thanks, Earnie Gainer, RN, MSN, CDCES Diabetes Coordinator Inpatient Diabetes Program [phone] (Team Pager from 8am to 5pm)

## 2024-09-10 NOTE — Progress Notes (Signed)
 Mobility Specialist Progress Note:    09/10/24 0929  Mobility  Activity Pivoted/transferred from bed to chair;Pivoted/transferred from chair to bed  Level of Assistance Moderate assist, patient does 50-74%  Assistive Device Front wheel walker  Distance Ambulated (ft) 3 ft  Activity Response Tolerated well  Mobility Referral Yes  Mobility visit 1 Mobility  Mobility Specialist Start Time (ACUTE ONLY) [id number]  Mobility Specialist Stop Time (ACUTE ONLY) 0943  Mobility Specialist Time Calculation (min) (ACUTE ONLY) 14 min   Pt received in chair. Requesting bath after purewick malfunction. Linens changed. ModA+2 to stand with RW. Tolerated well, asx throughout. Transferred back to chair after linen change and bath. All needs met, call bell in reach, chair alarm on.    Andersen Mckiver Mobility Specialist Please contact via Special Educational Needs Teacher or  Rehab office at [phone]

## 2024-09-10 NOTE — Progress Notes (Signed)
 Occupational Therapy Treatment Patient Details Name: [name] MRN: [id number] DOB: 01/28/1933 Today's Date: 09/10/2024   History of present illness Pt is a 89 y/o female who presented to [facility] on 09/03/24 with c/o worsening shortness of breath, orthopnea, paroxysmal nocturnal dyspnea, and bilateral lower extremity edema for several weeks. Admitted for evaluation of acute on chronic CHF. PMH includes: arthrits, anemia, cataract, CHB status post PPM, permanent A-fib on Eliquis , SVT, HFrEF, OSA on 2 L supplemental oxygen at bedtime (BiPAP intolerant per pulmonology note), breast cancer status post antiestrogen therapy with anastrozole  11/28/2018-02/21/2022, CKD stage IIIb, CVA, type 2 diabetes, hypertension, history of DVT, GERD   OT comments  Pt presented in bed and wanting to get OOB. Pt required increase in time with CGA to min assist with BLE for supine to sitting. She was able to the complete step pivot to chair with min-mod assist. At this time only wanted to complete face hygiene post set up and then needed min assist to set up breakfast. At this time recommendation for SNF at [facility].       If plan is discharge home, recommend the following:  A lot of help with walking and/or transfers;A lot of help with bathing/dressing/bathroom;Assistance with cooking/housework;Direct supervision/assist for medications management;Direct supervision/assist for financial management;Assist for transportation;Help with stairs or ramp for entrance;Supervision due to cognitive status   Equipment Recommendations   (TBD at ext venue)    Recommendations for Other Services      Precautions / Restrictions Precautions Precautions: Fall Recall of Precautions/Restrictions: Impaired Restrictions Weight Bearing Restrictions Per Provider Order: No       Mobility Bed Mobility Overal bed mobility: Needs Assistance Bed Mobility: Supine to Sit Rolling: Contact guard assist   Supine to sit: Contact guard, Min assist,  Used rails     General bed mobility comments: bed in supine    Transfers Overall transfer level: Needs assistance   Transfers: Sit to/from Stand Sit to Stand: Min assist, Mod assist           General transfer comment: step pivotted to chair, pt noted they did not realize they were able to take a step with transfer     Balance Overall balance assessment: Needs assistance Sitting-balance support: Feet supported, Bilateral upper extremity supported Sitting balance-Leahy Scale: Fair Sitting balance - Comments: often started to posterior sit and encouraged to remain at midline                                   ADL either performed or assessed with clinical judgement   ADL Overall ADL's : Needs assistance/impaired Eating/Feeding: Set up;Minimal assistance;Sitting Eating/Feeding Details (indicate cue type and reason): opening drink items Grooming: Wash/dry face;Set up;Sitting   Upper Body Bathing: Set up;Sitting;Contact guard assist   Lower Body Bathing: Maximal assistance;Sit to/from stand;Total assistance   Upper Body Dressing : Contact guard assist;Sitting   Lower Body Dressing: Maximal assistance;Total assistance;Sit to/from stand Lower Body Dressing Details (indicate cue type and reason): completed BLE donning of shoes and was able to coordinate to place foot into shoe Toilet Transfer: Minimal assistance;Cueing for safety;Cueing for sequencing Toilet Transfer Details (indicate cue type and reason): step pivoted and simulated to chair Toileting- Clothing Manipulation and Hygiene: Total assistance;Sitting/lateral lean;Sit to/from stand       Functional mobility during ADLs: Contact guard assist;Minimal assistance;Cueing for sequencing;Cueing for safety      Extremity/Trunk Assessment Upper Extremity Assessment Upper Extremity  Assessment: RUE deficits/detail RUE Deficits / Details: hx of CVA with residual R side weakness; generalized weakness; noted  increase in AROM through from last session to grossly 50 to about 70 deg shoulder flexion when completion of ADLS RUE Coordination: decreased fine motor;decreased gross motor LUE Deficits / Details: generalized weakness pt reported near baseline and now gone from about 70 deg to 100 deg shoulder flexion LUE Coordination: decreased fine motor;decreased gross motor   Lower Extremity Assessment Lower Extremity Assessment: Defer to PT evaluation        Vision   Vision Assessment?: Wears glasses for reading   Perception     Praxis     Communication Communication Communication: No apparent difficulties   Cognition Arousal: Alert Behavior During Therapy: WFL for tasks assessed/performed Cognition: No family/caregiver present to determine baseline       Memory impairment (select all impairments): Short-term memory, Working civil service fast streamer, Conservation officer, historic buildings Attention impairment (select first level of impairment): Alternating attention Executive functioning impairment (select all impairments): Sequencing, Reasoning, Problem solving, Organization OT - Cognition Comments: Pt slower to process information but was reporting on how they were transfering                 Following commands: Impaired Following commands impaired: Only follows one step commands consistently      Cueing   Cueing Techniques: Verbal cues  Exercises      Shoulder Instructions       General Comments      Pertinent Vitals/ Pain       Pain Assessment Pain Assessment: Faces Faces Pain Scale: Hurts a little bit Pain Location: BLE stiffness Pain Descriptors / Indicators: Aching Pain Intervention(s): Limited activity within patient's tolerance, Monitored during session, Repositioned  Home Living                                          Prior Functioning/Environment              Frequency  Min 2X/week        Progress Toward Goals  OT Goals(current goals can now be  found in the care plan section)  Progress towards OT goals: Progressing toward goals  Acute Rehab OT Goals Patient Stated Goal: to get OOB OT Goal Formulation: With patient Time For Goal Achievement: 09/20/24 Potential to Achieve Goals: Fair ADL Goals Pt Will Perform Upper Body Bathing: with min assist;sitting Pt Will Perform Upper Body Dressing: with contact guard assist;sitting Pt Will Transfer to Toilet: with min assist;stand pivot transfer;bedside commode Pt Will Perform Toileting - Clothing Manipulation and hygiene: with mod assist;sitting/lateral leans;sit to/from stand Pt/caregiver will Perform Home Exercise Program: Increased ROM;Increased strength;Both right and left upper extremity;With minimal assist;With written HEP provided  Plan      Co-evaluation                 AM-PAC OT 6 Clicks Daily Activity     Outcome Measure   Help from another person eating meals?: A Little Help from another person taking care of personal grooming?: A Little Help from another person toileting, which includes using toliet, bedpan, or urinal?: A Lot Help from another person bathing (including washing, rinsing, drying)?: A Lot Help from another person to put on and taking off regular upper body clothing?: A Little Help from another person to put on and taking off regular lower body clothing?: A Lot 6  Click Score: 15    End of Session Equipment Utilized During Treatment: Gait belt  OT Visit Diagnosis: Muscle weakness (generalized) (M62.81);Pain Pain - part of body: Leg (BLE)   Activity Tolerance Patient tolerated treatment well   Patient Left in chair;with call bell/phone within reach;with chair alarm set   Nurse Communication Mobility status        Time: 9194-9173 OT Time Calculation (min): 21 min  Charges: OT General Charges $OT Visit: 1 Visit OT Treatments $Self Care/Home Management : 8-22 mins  Warrick POUR OTR/L  Acute Rehab Services  [phone] office  number   Warrick Berber 09/10/2024, 8:37 AM

## 2024-09-10 NOTE — Progress Notes (Signed)
 Rounding Note   Patient Name: [name] Date of Encounter: 09/10/2024  [facility] Cardiologist: Oneil Parchment, MD   Subjective  Pt denies CP or dyspnea  Scheduled Meds:  amiodarone   200 mg Oral Once per day on Monday Tuesday Wednesday Thursday Friday   apixaban   2.5 mg Oral BID   carvedilol   6.25 mg Oral BID WC   insulin  aspart  0-15 Units Subcutaneous TID WC   insulin  aspart  0-5 Units Subcutaneous QHS   insulin  aspart  3 Units Subcutaneous TID WC   insulin  glargine-yfgn  15 Units Subcutaneous Daily   isosorbide -hydrALAZINE   1 tablet Oral TID   mirabegron  ER  25 mg Oral Daily   pantoprazole   40 mg Oral Daily   pregabalin   25 mg Oral BID   simethicone   80 mg Oral QID   Continuous Infusions:  PRN Meds: acetaminophen  **OR** acetaminophen , ondansetron  (ZOFRAN ) IV   Vital Signs  Vitals:   09/09/24 2311 09/10/24 0424 09/10/24 0622 09/10/24 0826  BP: (!) 94/47  117/61 132/67  Pulse: 74  65 70  Resp: 17   10  Temp: 98.3 F (36.8 C)  97.9 F (36.6 C) 97.8 F (36.6 C)  TempSrc: Oral  Oral Oral  SpO2: 100%  97%   Weight:  84.8 kg    Height:        Intake/Output Summary (Last 24 hours) at 09/10/2024 0849 Last data filed at 09/09/2024 1820 Gross per 24 hour  Intake 480 ml  Output --  Net 480 ml      09/10/2024    4:24 AM 09/09/2024    4:23 AM 09/08/2024    6:25 AM  Last 3 Weights  Weight (lbs) 186 lb 15.2 oz 186 lb 15.2 oz 190 lb 7.6 oz  Weight (kg) 84.8 kg 84.8 kg 86.4 kg      Telemetry Vpaced with underlying atrial flutter - Personally Reviewed   Physical Exam  GEN: NAD Neck: supple Cardiac: irregular Respiratory: CTA GI: Soft, NT/ND MS: No edema Neuro:  Grossly intact Psych: Normal affect   Labs High Sensitivity Troponin:   Recent Labs  Lab 09/04/24 0305  TROPONINIHS 55*     Chemistry Recent Labs  Lab 09/04/24 0627 09/05/24 0328 09/08/24 0339 09/09/24 0417 09/10/24 0402  NA 145   < > 140 139 136  K 3.4*   < > 4.3 4.8  4.4  CL 102   < > 101 103 97*  CO2 28   < > 26 21* 24  GLUCOSE 227*   < > 183* 138* 202*  BUN 15   < > 31* 34* 40*  CREATININE 1.21*   < > 2.28* 2.56* 2.67*  CALCIUM  8.5*   < > 8.7* 8.5* 8.4*  MG  --   --   --  2.5* 2.4  PROT 6.6  --   --   --   --   ALBUMIN  3.2*  --   --   --   --   AST 17  --   --   --   --   ALT 13  --   --   --   --   ALKPHOS 63  --   --   --   --   BILITOT 0.8  --   --   --   --   GFRNONAA 43*   < > 20* 17* 17*  ANIONGAP 15   < > 13 15 15    < > =  values in this interval not displayed.     Hematology Recent Labs  Lab 09/03/24 1633 09/09/24 0417 09/10/24 0402  WBC 4.4 4.4 4.0  RBC 3.88 3.62* 3.26*  HGB 10.9* 10.5* 9.4*  HCT 35.9* 34.3* 30.4*  MCV 92.5 94.8 93.3  MCH 28.1 29.0 28.8  MCHC 30.4 30.6 30.9  RDW 15.1 15.6* 15.6*  PLT 161 121* 128*    BNP Recent Labs  Lab 09/03/24 1633  BNP 383.6*      Patient Profile   [name] is a 89 y.o. female with a hx of permanent A-fib, SVT, complete heart block s/p PPM, known LBBB, chronic combined CHF, history of DVT on Eliquis , hypertension, CKD stage 3b, chronic anemia/thrombocytopenia, diabetes, who is being seen 09/05/2024 for the evaluation of acute on chronic CHF.  Echocardiogram this admission shows ejection fraction 30 to 35%, moderate left ventricular hypertrophy, grade 3 diastolic dysfunction, moderate pulmonary hypertension, small pericardial fusion, severe left atrial enlargement, mild mitral regurgitation.  Assessment & Plan  1 Acute on chronic combined systolic/diastolic congestive heart failure-patient is not volume overloaded on examination.  Creatinine appears to be leveling off.  Continue to hold diuretics today.  Will resume lower dose as renal function improves.    2 cardiomyopathy-ejection fraction 30 to 35%.  Continue low-dose BiDil  and carvedilol .  No ARB or Entresto due to acute renal insufficiency.  3 acute kidney injury-creatinine slightly worse today but appears to be leveling  off.  Continue to hold diuretics and follow.  We will likely resume low-dose diuretic as renal function improves.  4 permanent atrial fibrillation-continue carvedilol  and apixaban .  5 hypertension-blood pressure is reasonable.  Continue present medical regimen.  6 status post pacemaker  For questions or updates, please contact [facility] Please consult [email or web address] for contact info under    Signed, Redell Shallow, MD  09/10/2024, 8:49 AM

## 2024-09-10 NOTE — Progress Notes (Signed)
 [facility] Progress Note Patient: [name] FMW:[id number] DOB: December 31, 1932  DOA: 09/03/2024 DOS: the patient was seen and examined on 09/10/2024  Brief Hospital Course: 89/F SNF resident, with complete heart block, PPM, permanent A-fib on Eliquis , chronic combined CHF, OSA on 2 L O2, breast cancer treated with antiestrogen therapy, CKD 3B, CVA, type 2 diabetes mellitus, hypertension, DVT, GERD presented to the ED with worsening dyspnea, orthopnea cough PND and edema.  Allegedly on torsemide  at baseline, reports compliance In the ER she was hypertensive, hemoglobin 8.9, no leukocytosis, creatinine 1.3, BNP 383, flu COVID and RSV PCR were negative, chest x-ray noted cardiomegaly pulmonary vascular congestion Admitted, started on diuretics, echo noted worsening cardiomyopathy, cards consulted  Assessment and Plan: Acute on chronic HFmrEF Pulmonary hypertension, RV failure Last echo 3/24 noted EF 45-50%, moderately reduced RV, severely elevated PASP, moderate mitral regurgitation Presenting with worsening dyspnea on exertion, orthopnea and edema Repeat echo with a EF down to 30-35%, moderate LVH, grade 3 DD, mildly reduced RV, moderately elevated PASP, appreciate cards input Improving with diuresis. Continue Coreg , Aldactone, BiDil , poor candidate for SGLT2i Currently holding diuresis due to worsening renal function.   AKI on CKD stage IIIb Baseline serum creatinine appears to be around 1.8-1.5. Continues to trend up.  Continue to hold.   Chronic anemia Thrombocytopenia Hemoglobin at baseline. Platelet count low unremarkable for now.  Chest pain Reports having an episode last week, since resolved, troponin 55, do not suspect ACS   History of CHB, PPM Follow-up with the EP.  Type 2 diabetes mellitus, uncontrolled with hyperglycemia with long-term insulin  use with CKD. No recent hemoglobin A1c. On insulin  at home. Currently on 15 units basal and sliding scale insulin  if  Premeal coverage. Given worsening renal function I will maintain the same dose despite high sugars   Paroxysmal A-fib Continue amiodarone  and Eliquis .  Eliquis  dose adjusted based on renal function.   History of CVA Continue Eliquis   Obesity Class 1 Body mass index is 31.11 kg/m.  Placing the pt at higher risk of poor outcomes  Neuropathy. On Lyrica . Initiating at a lower dose.  Constipation. Improving with current regimen.  For now continue. Discontinue lactulose  as it can lead to bloating and nausea.   Subjective: No nausea no vomiting no fever no chills.  No chest pain.  Feeling better.  Physical Exam: Clear to auscultation. S1-S2 present Bowel sounds present Trace edema.  Data Reviewed: I have Reviewed nursing notes, Vitals, and Lab results. Since last encounter, pertinent lab results CBC and BMP   . I have ordered test including CBC and BMP  . I have discussed pt's care plan and test results with cardiology  .   Disposition: Status is: Inpatient Remains inpatient appropriate because: Monitor for improvement in renal function  apixaban  (ELIQUIS ) tablet 2.5 mg Start: 09/04/24 1000 apixaban  (ELIQUIS ) tablet 2.5 mg   Family Communication: No one at bedside Level of care: Progressive   Vitals:   09/10/24 0826 09/10/24 0942 09/10/24 1107 09/10/24 1633  BP: 132/67 (!) 143/57 119/61 121/72  Pulse: 70 74 65 65  Resp: 10 16 20 20   Temp: 97.8 F (36.6 C) 97.6 F (36.4 C) 97.8 F (36.6 C) 97.9 F (36.6 C)  TempSrc: Oral Oral Oral Oral  SpO2:  99%  99%  Weight:      Height:         Author: Yetta Blanch, MD 09/10/2024 4:44 PM  Please look on [email or web address] to find out who is on call.

## 2024-09-10 NOTE — Plan of Care (Signed)
  Problem: Education: Goal: Ability to describe self-care measures that may prevent or decrease complications (Diabetes Survival Skills Education) will improve Outcome: Progressing Goal: Individualized Educational Video(s) Outcome: Progressing   Problem: Coping: Goal: Ability to adjust to condition or change in health will improve Outcome: Progressing   Problem: Fluid Volume: Goal: Ability to maintain a balanced intake and output will improve Outcome: Progressing   Problem: Health Behavior/Discharge Planning: Goal: Ability to identify and utilize available resources and services will improve Outcome: Progressing Goal: Ability to manage health-related needs will improve Outcome: Progressing   Problem: Health Behavior/Discharge Planning: Goal: Ability to manage health-related needs will improve Outcome: Progressing

## 2024-09-11 ENCOUNTER — Ambulatory Visit (HOSPITAL_COMMUNITY)

## 2024-09-11 DIAGNOSIS — I5023 Acute on chronic systolic (congestive) heart failure: Secondary | ICD-10-CM | POA: Diagnosis not present

## 2024-09-11 LAB — BASIC METABOLIC PANEL WITH GFR
Anion gap: 14 (ref 5–15)
BUN: 39 mg/dL — ABNORMAL HIGH (ref 8–23)
CO2: 24 mmol/L (ref 22–32)
Calcium: 8.4 mg/dL — ABNORMAL LOW (ref 8.9–10.3)
Chloride: 101 mmol/L (ref 98–111)
Creatinine, Ser: 2.48 mg/dL — ABNORMAL HIGH (ref 0.44–1.00)
GFR, Estimated: 18 mL/min — ABNORMAL LOW (ref >60)
Glucose, Bld: 139 mg/dL — ABNORMAL HIGH (ref 70–99)
Potassium: 4.2 mmol/L (ref 3.5–5.1)
Sodium: 139 mmol/L (ref 135–145)

## 2024-09-11 LAB — GLUCOSE, CAPILLARY
Glucose-Capillary: 151 mg/dL — ABNORMAL HIGH (ref 70–99)
Glucose-Capillary: 215 mg/dL — ABNORMAL HIGH (ref 70–99)
Glucose-Capillary: 159 mg/dL — ABNORMAL HIGH (ref 70–99)
Glucose-Capillary: 186 mg/dL — ABNORMAL HIGH (ref 70–99)

## 2024-09-11 LAB — CBC
HCT: 29.2 % — ABNORMAL LOW (ref 36.0–46.0)
Hemoglobin: 9.3 g/dL — ABNORMAL LOW (ref 12.0–15.0)
MCH: 29.3 pg (ref 26.0–34.0)
MCHC: 31.8 g/dL (ref 30.0–36.0)
MCV: 92.1 fL (ref 80.0–100.0)
Platelets: 126 K/uL — ABNORMAL LOW (ref 150–400)
RBC: 3.17 MIL/uL — ABNORMAL LOW (ref 3.87–5.11)
RDW: 15.5 % (ref 11.5–15.5)
WBC: 3.5 K/uL — ABNORMAL LOW (ref 4.0–10.5)
nRBC: 0 % (ref 0.0–0.2)

## 2024-09-11 LAB — MAGNESIUM: Magnesium: 2.5 mg/dL — ABNORMAL HIGH (ref 1.7–2.4)

## 2024-09-11 NOTE — Progress Notes (Signed)
 Mobility Specialist Progress Note;   09/11/24 1506  Mobility  Activity Pivoted/transferred from chair to bed  Level of Assistance Minimal assist, patient does 75% or more  Assistive Device Other (Comment) (HHA)  Distance Ambulated (ft) 8 ft  Activity Response Tolerated well  Mobility Referral Yes  Mobility visit 1 Mobility  Mobility Specialist Start Time (ACUTE ONLY) 1506  Mobility Specialist Stop Time (ACUTE ONLY) 1518  Mobility Specialist Time Calculation (min) (ACUTE ONLY) 12 min   Pt requesting assistance back to bed. Required MinA+2 via HHA to safely transfer to [facility] then to chair. VSS throughout. Only c/o feeling cold, provided hot blankets for pt once in bed. Pt left with all needs met, alarm on.   Lauraine Erm Mobility Specialist Please contact via SecureChat or [employer] [phone]

## 2024-09-11 NOTE — Progress Notes (Signed)
 [facility] Progress Note Patient: [name] FMW:[id number] DOB: 25-Dec-1932  DOA: 09/03/2024 DOS: the patient was seen and examined on 09/11/2024  Brief Hospital Course: 89/F SNF resident, with complete heart block, PPM, permanent A-fib on Eliquis , chronic combined CHF, OSA on 2 L O2, breast cancer treated with antiestrogen therapy, CKD 3B, CVA, type 2 diabetes mellitus, hypertension, DVT, GERD presented to the ED with worsening dyspnea, orthopnea cough PND and edema.  Allegedly on torsemide  at baseline, reports compliance In the ER she was hypertensive, hemoglobin 8.9, no leukocytosis, creatinine 1.3, BNP 383, flu COVID and RSV PCR were negative, chest x-ray noted cardiomegaly pulmonary vascular congestion Admitted, started on diuretics, echo noted worsening cardiomyopathy, cards consulted  Assessment and Plan: Acute on chronic HFmrEF Pulmonary hypertension, RV failure Last echo 3/24 noted EF 45-50%, moderately reduced RV, severely elevated PASP, moderate mitral regurgitation Presenting with worsening dyspnea on exertion, orthopnea and edema Repeat echo with a EF down to 30-35%, moderate LVH, grade 3 DD, mildly reduced RV, moderately elevated PASP, appreciate cards input Improving with diuresis. Continue Coreg , Aldactone, BiDil , poor candidate for SGLT2i Currently holding diuresis due to worsening renal function.   AKI on CKD stage IIIb Baseline serum creatinine appears to be around 1.8-1.5. Creatinine finally plateaued and now appears to be trending down. Continue to hold diuresis.   Chronic anemia Thrombocytopenia Hemoglobin at baseline. Platelet count low unremarkable for now.  Chest pain Reports having an episode last week, since resolved, troponin 55, do not suspect ACS   History of CHB, PPM Follow-up with the EP.  Type 2 diabetes mellitus, uncontrolled with hyperglycemia with long-term insulin  use with CKD. No recent hemoglobin A1c. On insulin  at home. Currently  on 15 units basal and sliding scale insulin  if Premeal coverage. Given worsening renal function I will maintain the same dose despite high sugars   Paroxysmal A-fib Continue amiodarone  and Eliquis .  Eliquis  dose adjusted based on renal function.   History of CVA Continue Eliquis   Obesity Class 1 Body mass index is 31.11 kg/m.  Placing the pt at higher risk of poor outcomes  Neuropathy. On Lyrica . Initiating at a lower dose.  Constipation. Improving with current regimen.  For now continue. Discontinue lactulose  as it can lead to bloating and nausea.   Subjective: Feeling better.  No nausea no vomiting.  No other acute complaint.  Physical Exam: Trace edema. S1-S2 present Bowel sounds present No crackles.  Data Reviewed: I have Reviewed nursing notes, Vitals, and Lab results. Since last encounter, pertinent lab results CBC and BMP   . I have ordered test including CBC and BMP  .   Disposition: Status is: Inpatient Remains inpatient appropriate because: Improvement in volume status  apixaban  (ELIQUIS ) tablet 2.5 mg Start: 09/04/24 1000 apixaban  (ELIQUIS ) tablet 2.5 mg    Family Communication: No at bedside Level of care: Telemetry   Vitals:   09/11/24 0420 09/11/24 0827 09/11/24 1229 09/11/24 1707  BP: (!) 113/56 138/89 (!) 102/58 (!) 122/57  Pulse: 60  75 68  Resp: 16 20 19 14   Temp: 97.8 F (36.6 C) 98 F (36.7 C) 97.9 F (36.6 C) 97.9 F (36.6 C)  TempSrc: Oral Oral Oral Oral  SpO2: 95%     Weight: 86.6 kg     Height:         Author: Yetta Blanch, MD 09/11/2024 6:01 PM  Please look on [email or web address] to find out who is on call.

## 2024-09-11 NOTE — Evaluation (Signed)
 Physical Therapy Evaluation Patient Details Name: DEWANA AMMIRATI MRN: [id number] DOB: 1933-01-10 Today's Date: 09/11/2024  History of Present Illness  Pt is a 89 y/o female who presented to [facility] on 09/03/24 with c/o worsening shortness of breath, orthopnea, paroxysmal nocturnal dyspnea, and bilateral lower extremity edema for several weeks. Admitted for evaluation of acute on chronic CHF. PMH includes: arthrits, anemia, cataract, CHB status post PPM, permanent A-fib on Eliquis , SVT, HFrEF, OSA on 2 L supplemental oxygen at bedtime (BiPAP intolerant per pulmonology note), breast cancer status post antiestrogen therapy with anastrozole  11/28/2018-02/21/2022, CKD stage IIIb, CVA, type 2 diabetes, hypertension, history of DVT, GERD  Clinical Impression  Pt admitted with above diagnosis. Pt was able to stand pivot to recliner with CGA of 2 persons.  Pt with good technique overall.  Will work with pt while in hospital in conjuction with mobility specialists.  REcommend post acute rehab < 3 hours day so that pt can return to prior level of function. Pt currently with functional limitations due to the deficits listed below (see PT Problem List). Pt will benefit from acute skilled PT to increase their independence and safety with mobility to allow discharge.           If plan is discharge home, recommend the following: A little help with walking and/or transfers;A little help with bathing/dressing/bathroom;Assistance with cooking/housework;Assist for transportation;Help with stairs or ramp for entrance   Can travel by private vehicle   No    Equipment Recommendations None recommended by PT  Recommendations for Other Services       Functional Status Assessment Patient has had a recent decline in their functional status and demonstrates the ability to make significant improvements in function in a reasonable and predictable amount of time.     Precautions / Restrictions Precautions Precautions: Fall Recall  of Precautions/Restrictions: Impaired Restrictions Weight Bearing Restrictions Per Provider Order: No      Mobility  Bed Mobility Overal bed mobility: Needs Assistance Bed Mobility: Supine to Sit Rolling: Contact guard assist   Supine to sit: Contact guard, Min assist, Used rails     General bed mobility comments: incr time but can come to EOB without assist    Transfers Overall transfer level: Needs assistance   Transfers: Sit to/from Stand Sit to Stand: Contact guard assist, From elevated surface  Had +2 for safety but didn't need it today.          General transfer comment: step pivotted to chair, setting up as pt sets her wheelchair up at home.  Pt with partial stand and was able to stand and pivot to recliner with CGA only and no physical assist.  Great job with controlled descent into chair.    Ambulation/Gait                  Stairs            Wheelchair Mobility     Tilt Bed    Modified Rankin (Stroke Patients Only)       Balance Overall balance assessment: Needs assistance Sitting-balance support: Feet supported, Bilateral upper extremity supported, No upper extremity supported Sitting balance-Leahy Scale: Fair     Standing balance support: Bilateral upper extremity supported, During functional activity, Single extremity supported Standing balance-Leahy Scale: Poor Standing balance comment: needs at least 1 UE support reaching for arm of chair to stand and other arm once pivoted to steady self.  Pertinent Vitals/Pain Pain Assessment Pain Assessment: Faces Faces Pain Scale: Hurts a little bit Pain Location: BLE stiffness Pain Descriptors / Indicators: Aching Pain Intervention(s): Limited activity within patient's tolerance, Monitored during session, Repositioned    Home Living Family/patient expects to be discharged to:: Skilled nursing facility                   Additional Comments:  Pt is a LTC resident of [name].    Prior Function Prior Level of Function : Needs assist             Mobility Comments: Pt reports at baseline she is able to transfer bed<->w/c and w/c <-> commode with MOd I; however, pt reports she has been needing assist for transfers for a while. Pt also reports she has been working with PT at [facility]. ADLs Comments: Pt reports at baseline she self-feed with Set up and receives assistance for all other ADLs. Pt reports she and staff work together to complete ADLs. Pt is unable to provide further information regarding level of assistance. Pt reports she had been receiving OT at SNF, but stopped due to pain in Left shoulder. Pt uncertain when OT was stopped.     Extremity/Trunk Assessment   Upper Extremity Assessment Upper Extremity Assessment: Defer to OT evaluation    Lower Extremity Assessment Lower Extremity Assessment: Generalized weakness       Communication   Communication Communication: No apparent difficulties    Cognition Arousal: Alert Behavior During Therapy: WFL for tasks assessed/performed                             Following commands: Impaired Following commands impaired: Only follows one step commands consistently     Cueing Cueing Techniques: Verbal cues     General Comments General comments (skin integrity, edema, etc.): VSS on RA    Exercises General Exercises - Lower Extremity Ankle Circles/Pumps: AROM, Both, 10 reps, Supine Long Arc Quad: AROM, Both, 10 reps, Seated Hip Flexion/Marching: AROM, Both, 10 reps, Seated   Assessment/Plan    PT Assessment Patient needs continued PT services  PT Problem List Decreased balance;Decreased activity tolerance;Decreased mobility;Decreased knowledge of use of DME;Decreased safety awareness;Decreased knowledge of precautions;Decreased strength       PT Treatment Interventions DME instruction;Functional mobility training;Therapeutic  exercise;Therapeutic activities;Balance training;Patient/family education;Wheelchair mobility training    PT Goals (Current goals can be found in the Care Plan section)  Acute Rehab PT Goals Patient Stated Goal: to go backl to facility PT Goal Formulation: With patient Time For Goal Achievement: 09/25/24 Potential to Achieve Goals: Fair    Frequency Min 1X/week     Co-evaluation               AM-PAC PT 6 Clicks Mobility  Outcome Measure Help needed turning from your back to your side while in a flat bed without using bedrails?: A Little Help needed moving from lying on your back to sitting on the side of a flat bed without using bedrails?: A Little Help needed moving to and from a bed to a chair (including a wheelchair)?: A Little Help needed standing up from a chair using your arms (e.g., wheelchair or bedside chair)?: A Little Help needed to walk in hospital room?: Total Help needed climbing 3-5 steps with a railing? : Total 6 Click Score: 14    End of Session Equipment Utilized During Treatment: Gait belt Activity Tolerance: Patient limited by  fatigue;Patient limited by pain Patient left: in chair;with call bell/phone within reach;with chair alarm set Nurse Communication: Mobility status PT Visit Diagnosis: Muscle weakness (generalized) (M62.81)    Time: 9052-8994 PT Time Calculation (min) (ACUTE ONLY): 18 min   Charges:   PT Evaluation $PT Eval Moderate Complexity: 1 Mod   PT General Charges $$ ACUTE PT VISIT: 1 Visit         Eoin Willden M,PT Acute Rehab Services [phone]   Stephane JULIANNA Bevel 09/11/2024, 11:24 AM

## 2024-09-11 NOTE — Progress Notes (Signed)
 Rounding Note   Patient Name: [name] Date of Encounter: 09/11/2024  [facility] Cardiologist: Oneil Parchment, MD   Subjective  No CP or dyspnea  Scheduled Meds:  amiodarone   200 mg Oral Once per day on Monday Tuesday Wednesday Thursday Friday   apixaban   2.5 mg Oral BID   carvedilol   6.25 mg Oral BID WC   insulin  aspart  0-15 Units Subcutaneous TID WC   insulin  aspart  0-5 Units Subcutaneous QHS   insulin  aspart  3 Units Subcutaneous TID WC   insulin  glargine-yfgn  15 Units Subcutaneous Daily   isosorbide -hydrALAZINE   1 tablet Oral TID   mirabegron  ER  25 mg Oral Daily   pantoprazole   40 mg Oral Daily   pregabalin   25 mg Oral BID   simethicone   80 mg Oral QID   Continuous Infusions:  PRN Meds: acetaminophen  **OR** acetaminophen , ondansetron  (ZOFRAN ) IV   Vital Signs  Vitals:   09/10/24 1633 09/10/24 2010 09/11/24 0005 09/11/24 0420  BP: 121/72 (!) 119/54 (!) 90/56 (!) 113/56  Pulse: 65 64 60 60  Resp: 20   16  Temp: 97.9 F (36.6 C) 98 F (36.7 C) 97.8 F (36.6 C) 97.8 F (36.6 C)  TempSrc: Oral Oral Oral Oral  SpO2: 99% 96% 100% 95%  Weight:    86.6 kg  Height:        Intake/Output Summary (Last 24 hours) at 09/11/2024 9177 Last data filed at 09/10/2024 2023 Gross per 24 hour  Intake 480 ml  Output 350 ml  Net 130 ml      09/11/2024    4:20 AM 09/10/2024    4:24 AM 09/09/2024    4:23 AM  Last 3 Weights  Weight (lbs) 190 lb 14.7 oz 186 lb 15.2 oz 186 lb 15.2 oz  Weight (kg) 86.6 kg 84.8 kg 84.8 kg      Telemetry Vpaced with underlying atrial flutter - Personally Reviewed   Physical Exam  GEN: NAD, elderly, WD Neck: supple, no JVD Cardiac: irregular, no rub Respiratory: CTA, no wheeze GI: Soft, NT/ND, no masses MS: No edema Neuro:  No focal findings Psych: Normal affect   Labs High Sensitivity Troponin:   Recent Labs  Lab 09/04/24 0305  TROPONINIHS 55*     Chemistry Recent Labs  Lab 09/09/24 0417 09/10/24 0402  09/11/24 0348  NA 139 136 139  K 4.8 4.4 4.2  CL 103 97* 101  CO2 21* 24 24  GLUCOSE 138* 202* 139*  BUN 34* 40* 39*  CREATININE 2.56* 2.67* 2.48*  CALCIUM  8.5* 8.4* 8.4*  MG 2.5* 2.4 2.5*  GFRNONAA 17* 17* 18*  ANIONGAP 15 15 14      Hematology Recent Labs  Lab 09/09/24 0417 09/10/24 0402 09/11/24 0348  WBC 4.4 4.0 3.5*  RBC 3.62* 3.26* 3.17*  HGB 10.5* 9.4* 9.3*  HCT 34.3* 30.4* 29.2*  MCV 94.8 93.3 92.1  MCH 29.0 28.8 29.3  MCHC 30.6 30.9 31.8  RDW 15.6* 15.6* 15.5  PLT 121* 128* 126*      Patient Profile   [name] is a 89 y.o. female with a hx of permanent A-fib, SVT, complete heart block s/p PPM, known LBBB, chronic combined CHF, history of DVT on Eliquis , hypertension, CKD stage 3b, chronic anemia/thrombocytopenia, diabetes, who is being seen 09/05/2024 for the evaluation of acute on chronic CHF.  Echocardiogram this admission shows ejection fraction 30 to 35%, moderate left ventricular hypertrophy, grade 3 diastolic dysfunction, moderate pulmonary hypertension, small  pericardial fusion, severe left atrial enlargement, mild mitral regurgitation.  Assessment & Plan  1 Acute on chronic combined systolic/diastolic congestive heart failure-patient remains euvolemic on examination.  Creatinine now starting to trend down.  Will likely resume lower dose diuretic tomorrow.    2 cardiomyopathy-ejection fraction 30 to 35%.  Continue low-dose BiDil  and carvedilol .  No ARB or Entresto due to acute renal insufficiency.  3 acute kidney injury-creatinine improving today with holding diuretic.  Will continue to follow.    4 permanent atrial fibrillation-continue carvedilol  and apixaban .  5 hypertension-blood pressure is reasonable.  Continue present medical regimen.  6 status post pacemaker  For questions or updates, please contact [facility]    Signed, Redell Shallow, MD  09/11/2024, 8:22 AM

## 2024-09-12 DIAGNOSIS — I5023 Acute on chronic systolic (congestive) heart failure: Secondary | ICD-10-CM | POA: Diagnosis not present

## 2024-09-12 LAB — BASIC METABOLIC PANEL WITH GFR
Anion gap: 11 (ref 5–15)
BUN: 42 mg/dL — ABNORMAL HIGH (ref 8–23)
CO2: 25 mmol/L (ref 22–32)
Calcium: 8.4 mg/dL — ABNORMAL LOW (ref 8.9–10.3)
Chloride: 100 mmol/L (ref 98–111)
Creatinine, Ser: 2.2 mg/dL — ABNORMAL HIGH (ref 0.44–1.00)
GFR, Estimated: 21 mL/min — ABNORMAL LOW (ref >60)
Glucose, Bld: 190 mg/dL — ABNORMAL HIGH (ref 70–99)
Potassium: 4.2 mmol/L (ref 3.5–5.1)
Sodium: 136 mmol/L (ref 135–145)

## 2024-09-12 LAB — CBC
HCT: 30.6 % — ABNORMAL LOW (ref 36.0–46.0)
Hemoglobin: 9.4 g/dL — ABNORMAL LOW (ref 12.0–15.0)
MCH: 28.3 pg (ref 26.0–34.0)
MCHC: 30.7 g/dL (ref 30.0–36.0)
MCV: 92.2 fL (ref 80.0–100.0)
Platelets: 143 K/uL — ABNORMAL LOW (ref 150–400)
RBC: 3.32 MIL/uL — ABNORMAL LOW (ref 3.87–5.11)
RDW: 15.7 % — ABNORMAL HIGH (ref 11.5–15.5)
WBC: 3.4 K/uL — ABNORMAL LOW (ref 4.0–10.5)
nRBC: 0 % (ref 0.0–0.2)

## 2024-09-12 LAB — MAGNESIUM: Magnesium: 2.6 mg/dL — ABNORMAL HIGH (ref 1.7–2.4)

## 2024-09-12 LAB — GLUCOSE, CAPILLARY: Glucose-Capillary: 198 mg/dL — ABNORMAL HIGH (ref 70–99)

## 2024-09-12 MED ORDER — PREGABALIN 25 MG PO CAPS: 25.0000 mg | ORAL_CAPSULE | Freq: Two times a day (BID) | ORAL | 0 refills | Status: AC

## 2024-09-12 MED ORDER — ISOSORB DINITRATE-HYDRALAZINE 20-37.5 MG PO TABS: 1.0000 | ORAL_TABLET | Freq: Three times a day (TID) | ORAL | 0 refills | Status: DC

## 2024-09-12 MED ORDER — TORSEMIDE 20 MG PO TABS: 60.0000 mg | ORAL_TABLET | Freq: Every day | ORAL | 0 refills | Status: DC

## 2024-09-12 MED ORDER — SIMETHICONE 80 MG PO CHEW: 80.0000 mg | CHEWABLE_TABLET | Freq: Four times a day (QID) | ORAL | 0 refills | Status: DC

## 2024-09-12 NOTE — Progress Notes (Signed)
 Rounding Note   Patient Name: [name] Date of Encounter: 09/12/2024  [facility] Cardiologist: Oneil Parchment, MD   Subjective  Pt denies CP or dyspnea  Scheduled Meds:  amiodarone   200 mg Oral Once per day on Monday Tuesday Wednesday Thursday Friday   apixaban   2.5 mg Oral BID   carvedilol   6.25 mg Oral BID WC   insulin  aspart  0-15 Units Subcutaneous TID WC   insulin  aspart  0-5 Units Subcutaneous QHS   insulin  aspart  3 Units Subcutaneous TID WC   insulin  glargine-yfgn  15 Units Subcutaneous Daily   isosorbide -hydrALAZINE   1 tablet Oral TID   mirabegron  ER  25 mg Oral Daily   pantoprazole   40 mg Oral Daily   pregabalin   25 mg Oral BID   simethicone   80 mg Oral QID   Continuous Infusions:  PRN Meds: acetaminophen  **OR** acetaminophen , ondansetron  (ZOFRAN ) IV   Vital Signs  Vitals:   09/11/24 2315 09/12/24 0432 09/12/24 0500 09/12/24 0827  BP: (!) 110/52 (!) 121/55  118/82  Pulse:  67  65  Resp: 19 20  20   Temp: 98.3 F (36.8 C) 97.9 F (36.6 C)  98 F (36.7 C)  TempSrc: Oral Oral  Oral  SpO2: 98% 99%    Weight:   87.2 kg   Height:        Intake/Output Summary (Last 24 hours) at 09/12/2024 0854 Last data filed at 09/11/2024 1930 Gross per 24 hour  Intake --  Output 400 ml  Net -400 ml      09/12/2024    5:00 AM 09/11/2024    4:20 AM 09/10/2024    4:24 AM  Last 3 Weights  Weight (lbs) 192 lb 3.9 oz 190 lb 14.7 oz 186 lb 15.2 oz  Weight (kg) 87.2 kg 86.6 kg 84.8 kg      Telemetry Vpaced with underlying atrial flutter - Personally Reviewed   Physical Exam  GEN: NAD Neck: supple Cardiac: irregular Respiratory: CTA GI: Soft, NT/ND MS: No edema Neuro:  Grossly intact Psych: Normal affect   Labs High Sensitivity Troponin:   Recent Labs  Lab 09/04/24 0305  TROPONINIHS 55*     Chemistry Recent Labs  Lab 09/10/24 0402 09/11/24 0348 09/12/24 0333  NA 136 139 136  K 4.4 4.2 4.2  CL 97* 101 100  CO2 24 24 25   GLUCOSE  202* 139* 190*  BUN 40* 39* 42*  CREATININE 2.67* 2.48* 2.20*  CALCIUM  8.4* 8.4* 8.4*  MG 2.4 2.5* 2.6*  GFRNONAA 17* 18* 21*  ANIONGAP 15 14 11      Hematology Recent Labs  Lab 09/10/24 0402 09/11/24 0348 09/12/24 0333  WBC 4.0 3.5* 3.4*  RBC 3.26* 3.17* 3.32*  HGB 9.4* 9.3* 9.4*  HCT 30.4* 29.2* 30.6*  MCV 93.3 92.1 92.2  MCH 28.8 29.3 28.3  MCHC 30.9 31.8 30.7  RDW 15.6* 15.5 15.7*  PLT 128* 126* 143*      Patient Profile   [name] is a 89 y.o. female with a hx of permanent A-fib, SVT, complete heart block s/p PPM, known LBBB, chronic combined CHF, history of DVT on Eliquis , hypertension, CKD stage 3b, chronic anemia/thrombocytopenia, diabetes, who is being seen 09/05/2024 for the evaluation of acute on chronic CHF.  Echocardiogram this admission shows ejection fraction 30 to 35%, moderate left ventricular hypertrophy, grade 3 diastolic dysfunction, moderate pulmonary hypertension, small pericardial fusion, severe left atrial enlargement, mild mitral regurgitation.  Assessment & Plan  1  Acute on chronic combined systolic/diastolic congestive heart failure-patient remains euvolemic on examination.  Renal function slowly improving.  Would hold diuretic for 2 more days and then resume Demadex  at 60 mg daily with an additional 20 mg for weight gain of 2 to 3 pounds.  Check potassium and renal function in 1 week.  Will need follow-up with APP 2 weeks after discharge and Dr. Jeffrie in 8 to 12 weeks.  2 cardiomyopathy-ejection fraction 30 to 35%.  Continue low-dose BiDil  and carvedilol .  No ARB or Entresto due to acute renal insufficiency.  3 acute kidney injury-creatinine improving today with holding diuretic.  Continue to follow.    4 permanent atrial fibrillation-continue carvedilol  and apixaban .  5 hypertension-blood pressure is reasonable.  Continue present medical regimen.  6 status post pacemaker  Patient can be discharged from a cardiac standpoint with plan as  outlined above.  For questions or updates, please contact [facility]    Signed, Redell Shallow, MD  09/12/2024, 8:54 AM

## 2024-09-12 NOTE — TOC Transition Note (Signed)
 Transition of Care [facility]) - Discharge Note   Patient Details  Name: [name] MRN: [id number] Date of Birth: [birth year]  [facility] of [facility]) CM/SW Contact:  Whitfield Campanile, Student-Social Work Phone Number: 09/12/2024, 10:14 AM   Clinical Narrative:     Patient will Discharge to: [name]  Discharge Date: 09/12/2024  Family Notified: Philippa Gillis (Daughter) [phone] (Mobile)   Transport By: [facility]     Per MD patient is ready for discharge. RN, patient, and facility notified of discharge. Discharge Summary sent to facility. RN given number for report. [phone] rm# [id number] Ambulance transport requested for patient.     Social Work Administrator, arts off.  Whitfield Campanile, MSW Intern  Final next level of care: Skilled Nursing Facility Barriers to Discharge: No Barriers Identified   Patient Goals and CMS Choice            Discharge Placement PASRR number recieved: 09/12/24 Existing PASRR number confirmed : 09/12/24          Patient chooses bed at: [name] Patient to be transferred to facility by: [facility] Name of family member notified: Philippa Gillis (Daughter)  [phone] [facility]) Patient and family notified of of transfer: 09/12/24  Discharge Plan and Services Additional resources added to the After Visit Summary for   In-house Referral: Clinical Social Work                                   Social Drivers of Health (SDOH) Interventions SDOH Screenings   Food Insecurity: No Food Insecurity (09/04/2024)  Housing: Unknown (09/04/2024)  Recent Concern: Housing - High Risk (09/04/2024)  Transportation Needs: No Transportation Needs (09/04/2024)  Utilities: Not At Risk (09/04/2024)  Alcohol Screen: Low Risk  (09/05/2024)  Depression (PHQ2-9): Medium Risk (04/02/2020)  Financial Resource Strain: Low Risk  (09/05/2024)  Physical Activity: Inactive (04/02/2020)  Social Connections: Moderately Integrated (09/04/2024)  Stress: No Stress  Concern Present (04/02/2020)  Tobacco Use: Low Risk  (09/03/2024)     Readmission Risk Interventions     No data to display

## 2024-09-12 NOTE — Progress Notes (Signed)
 Order received to discharge patient.  Telemetry monitor removed and CCMD notified.  PIV access removed.  Report called to receiving RN at [facility].  AVS and other paperwork given to [facility] staff.

## 2024-09-12 NOTE — Care Management Important Message (Signed)
 Important Message  Patient Details  Name: [name] MRN: [id number] Date of Birth: 03-11-1933   Important Message Given:  Yes - Medicare IM     [name] 09/12/2024, 10:09 AM

## 2024-09-12 NOTE — Discharge Summary (Signed)
 Physician Discharge Summary   Patient: [name] MRN: [id number] DOB: [birth year]  Admit date:     09/03/2024  Discharge date: 09/12/24  Discharge Physician: Yetta Blanch  PCP: Pcp, No  Recommendations at discharge: Follow up with PCP in 1 week Repeat BMP in 1 week Follow up with Cardiology as recommended in 2 weeks   Follow-up Information     [facility]. Go in 5 day(s).   Specialty: Cardiology Why: Hospital follow up 09/16/2024 @ 9 am PLEASE bring a current medication list to appointment FREE valet parking, Entrance C, off [address] [facility] and [facility] entrance Contact information: [address] [place]  [phone]        PCP. Schedule an appointment as soon as possible for a visit in 1 week(s).   Why: with BMP lab to look at kidney/electrolyte numbers               Hospital Course: 89/F SNF resident, with complete heart block, PPM, permanent A-fib on Eliquis , chronic combined CHF, OSA on 2 L O2, breast cancer treated with antiestrogen therapy, CKD 3B, CVA, type 2 diabetes mellitus, hypertension, DVT, GERD presented to the ED with worsening dyspnea, orthopnea cough PND and edema.  Allegedly on torsemide  at baseline, reports compliance In the ER she was hypertensive, hemoglobin 8.9, no leukocytosis, creatinine 1.3, BNP 383, flu COVID and RSV PCR were negative, chest x-ray noted cardiomegaly pulmonary vascular congestion Admitted, started on diuretics, echo noted worsening cardiomyopathy, cards consulted.  Plan is to hold diuretic for 2 more days and then resume Demadex  at 60 mg daily with an additional 20 mg for weight gain. Check potassium and renal function in 1 week. Will need follow-up with APP 2 weeks after discharge and Dr. Jeffrie in 8 to 12 weeks.   Assessment and Plan: Acute on chronic HFmrEF Pulmonary hypertension, RV failure Last echo 3/24 noted EF 45-50%,  moderately reduced RV, severely elevated PASP, moderate mitral regurgitation Presenting with worsening dyspnea on exertion, orthopnea and edema Repeat echo with a EF down to 30-35%, moderate LVH, grade 3 DD, mildly reduced RV, moderately elevated PASP, appreciate cards input Improving with diuresis. Continue Coreg , BiDil ,  poor candidate for SGLT2i, ARB, Entresto or aldactone due to potassium levels and CKD Currently holding diuresis due to worsening renal function. As above resume torsemide  on 11/23 and repeat BMP in 1 week. Close follow up with Cardiology   AKI on CKD stage IIIb Baseline serum creatinine appears to be around 1.8-1.5. Creatinine finally plateaued and now appears to be trending down. hold diuresis for 2 days as above.    Chronic anemia Thrombocytopenia Hemoglobin at baseline. Platelet count low unremarkable for now.  Chest pain Reports having an episode last week, since resolved, troponin 55, do not suspect ACS   History of CHB, PPM Follow-up with the EP.  Type 2 diabetes mellitus, uncontrolled with hyperglycemia with long-term insulin  use with CKD. No recent hemoglobin A1c. On insulin  at home. Currently on 15 units basal and sliding scale insulin  if Premeal coverage. Given worsening renal function I will maintain the same dose despite high sugars   Paroxysmal A-fib Continue amiodarone  and Eliquis .  Eliquis  dose adjusted based on renal function.   History of CVA Continue Eliquis   Obesity Class 1 Body mass index is 31.99 kg/m.  Placing the pt at higher risk of poor outcomes  Neuropathy. On Lyrica . Initiating at a lower dose due to renal  function.  Constipation. Improving with current regimen.  For now continue. Discontinue lactulose  as it can lead to bloating and nausea.  Consultants:  Cardiology   Procedures performed:  Echocardiogram   DISCHARGE MEDICATION: Allergies as of 09/12/2024       Reactions   Aspirin Itching   Penicillins  Itching, Rash   DID THE REACTION INVOLVE: Swelling of the face/tongue/throat, SOB, or low BP? Yes Sudden or severe rash/hives, skin peeling, or the inside of the mouth or nose? No Did it require medical treatment? Yes When did it last happen? More than 10 years ago If all above answers are NO, may proceed with cephalosporin use. Tolerated full course of Unasyn  in 2021.   Remeron  [mirtazapine ] Other (See Comments)   Unknown reaction        Medication List     STOP taking these medications    amLODipine  5 MG tablet Commonly known as: NORVASC    HYDROcodone -acetaminophen  5-325 MG tablet Commonly known as: NORCO/VICODIN   lactulose  10 GM/15ML solution Commonly known as: CHRONULAC    loperamide  2 MG tablet Commonly known as: IMODIUM  A-D   ondansetron  4 MG tablet Commonly known as: ZOFRAN    Potassium Chloride  ER 20 MEQ Tbcr   senna 8.6 MG tablet Commonly known as: SENOKOT       TAKE these medications    acetaminophen  325 MG tablet Commonly known as: TYLENOL  Take 650 mg by mouth in the morning, at noon, and at bedtime.   amiodarone  200 MG tablet Commonly known as: PACERONE  Take one tablet by mouth daily Monday-Friday. Do NOT take Saturday and Sunday   Biofreeze Cool The Pain 4 % Gel Generic drug: Menthol (Topical Analgesic) Apply 1 application  topically every 12 (twelve) hours as needed (for osteoarthritis).   carvedilol  6.25 MG tablet Commonly known as: COREG  Take 6.25 mg by mouth 2 (two) times daily with a meal.   Docusate Sodium  100 MG capsule Take 100 mg by mouth every morning.   Eliquis  2.5 MG Tabs tablet Generic drug: apixaban  TAKE 1 TABLET(2.5 MG) BY MOUTH TWICE DAILY   fluticasone-salmeterol 250-50 MCG/ACT Aepb Commonly known as: ADVAIR Inhale 1 puff into the lungs 2 (two) times daily.   insulin  glargine 100 UNIT/ML injection Commonly known as: LANTUS  Inject 7-12 Units into the skin See admin instructions. Inject 12 units into the skin in  the morning and 7 units in the evening   insulin  lispro 100 UNIT/ML KwikPen Commonly known as: HumaLOG  KwikPen Before each meal 3 times a day, 140-199 - 2 units, 200-250 - 4 units, 251-299 - 6 units,  300-349 - 8 units,  350 or above 10 units. What changed:  how much to take how to take this when to take this additional instructions   ipratropium-albuterol  0.5-2.5 (3) MG/3ML Soln Commonly known as: DUONEB Take 3 mLs by nebulization every 4 (four) hours as needed (for shortness of breath).   isosorbide -hydrALAZINE  20-37.5 MG tablet Commonly known as: BIDIL  Take 1 tablet by mouth 3 (three) times daily.   lidocaine 5 % Commonly known as: LIDODERM Place 1 patch onto the skin daily. Apply to left posterior shoulder--Remove & Discard patch within 12 hours or as directed by MD   mirabegron  ER 25 MG Tb24 tablet Commonly known as: MYRBETRIQ  Take 25 mg by mouth daily.   omeprazole  20 MG tablet Commonly known as: PRILOSEC OTC Take 20 mg by mouth every morning.   polyethylene glycol 17 g packet Commonly known as: MIRALAX  / GLYCOLAX  Take 17 g by  mouth daily as needed for mild constipation.   pregabalin  25 MG capsule Commonly known as: LYRICA  Take 1 capsule (25 mg total) by mouth 2 (two) times daily. What changed:  medication strength how much to take when to take this Another medication with the same name was removed. Continue taking this medication, and follow the directions you see here.   simethicone  80 MG chewable tablet Commonly known as: MYLICON Chew 1 tablet (80 mg total) by mouth 4 (four) times daily.   SYSTANE COMPLETE OP Place 1 drop into both eyes as needed (for itching and dry eye).   torsemide  20 MG tablet Commonly known as: DEMADEX  Take 3 tablets (60 mg total) by mouth daily. Take additional 20 mg daily as needed for weight gain of 3 lbs in 1 day or 5 lbs in 1 week Start taking on: September 15, 2024 What changed:  how much to take how to take this when to  take this additional instructions These instructions start on September 15, 2024. If you are unsure what to do until then, ask your doctor or other care provider.       Disposition: SNF Diet recommendation: Cardiac diet  Discharge Exam: Vitals:   09/11/24 2315 09/12/24 0432 09/12/24 0500 09/12/24 0827  BP: (!) 110/52 (!) 121/55  118/82  Pulse:  67  65  Resp: 19 20  20   Temp: 98.3 F (36.8 C) 97.9 F (36.6 C)  98 F (36.7 C)  TempSrc: Oral Oral  Oral  SpO2: 98% 99%    Weight:   87.2 kg   Height:       General: in Mild distress, No Rash Cardiovascular: S1 and S2 Present, No Murmur Respiratory: Good respiratory effort, Bilateral Air entry present. basal Crackles, No wheezes Abdomen: Bowel Sound present, No tenderness Extremities: trace edema Neuro: Alert and oriented x3, no new focal deficit   Filed Weights   09/10/24 0424 09/11/24 0420 09/12/24 0500  Weight: 84.8 kg 86.6 kg 87.2 kg   Condition at discharge: stable  The results of significant diagnostics from this hospitalization (including imaging, microbiology, ancillary and laboratory) are listed below for reference.   Imaging Studies: ECHOCARDIOGRAM COMPLETE Result Date: 09/04/2024    ECHOCARDIOGRAM REPORT   Patient Name:   [place] TANAIRY PAYEUR Date of Exam: 09/04/2024 Medical Rec #:  [id number]    Height:       65.0 in Accession #:    [id number]   Weight:       198.0 lb Date of Birth:  May 16, 1933    BSA:          1.970 m Patient Age:    89 years     BP:           145/68 mmHg Patient Gender: F            HR:           60 bpm. Exam Location:  Inpatient Procedure: 2D Echo, Cardiac Doppler and Color Doppler (Both Spectral and Color            Flow Doppler were utilized during procedure). Indications:    CHF-acute systolic 150.21  History:        Patient has prior history of Echocardiogram examinations, most                 recent 12/29/2022. CHF, Pacemaker, Arrythmias:Atrial Fibrillation,                 Signs/Symptoms:Chest Pain and  Syncope; Risk  Factors:Hypertension                 and Diabetes. History of breast cancer.  Sonographer:    Merlynn Argyle Referring Phys: [id number] VASUNDHRA RATHORE IMPRESSIONS  1. Left ventricular ejection fraction, by estimation, is 30 to 35%. The left ventricle has moderately decreased function. The left ventricle has no regional wall motion abnormalities. There is moderate concentric left ventricular hypertrophy. Left ventricular diastolic parameters are consistent with Grade III diastolic dysfunction (restrictive).  2. Right ventricular systolic function is mildly reduced. The right ventricular size is normal. There is moderately elevated pulmonary artery systolic pressure. The estimated right ventricular systolic pressure is 49.6 mmHg.  3. Left atrial size was severely dilated.  4. A small pericardial effusion is present.  5. The mitral valve is normal in structure. Mild mitral valve regurgitation. No evidence of mitral stenosis.  6. The aortic valve is tricuspid. Aortic valve regurgitation is not visualized. No aortic stenosis is present.  7. The inferior vena cava is dilated in size with <50% respiratory variability, suggesting right atrial pressure of 15 mmHg. Comparison(s): Changes from prior study are noted. LV function worse than prior. FINDINGS  Left Ventricle: Left ventricular ejection fraction, by estimation, is 30 to 35%. The left ventricle has moderately decreased function. The left ventricle has no regional wall motion abnormalities. The left ventricular internal cavity size was normal in size. There is moderate concentric left ventricular hypertrophy. Left ventricular diastolic parameters are consistent with Grade III diastolic dysfunction (restrictive). Right Ventricle: The right ventricular size is normal. No increase in right ventricular wall thickness. Right ventricular systolic function is mildly reduced. There is moderately elevated pulmonary artery systolic pressure. The tricuspid regurgitant  velocity is 2.94 m/s, and with an assumed right atrial pressure of 15 mmHg, the estimated right ventricular systolic pressure is 49.6 mmHg. Left Atrium: Left atrial size was severely dilated. Right Atrium: Right atrial size was normal in size. Pericardium: A small pericardial effusion is present. Mitral Valve: The mitral valve is normal in structure. Mild mitral valve regurgitation. No evidence of mitral valve stenosis. MV peak gradient, 3.3 mmHg. The mean mitral valve gradient is 1.0 mmHg. Tricuspid Valve: The tricuspid valve is normal in structure. Tricuspid valve regurgitation is mild . No evidence of tricuspid stenosis. Aortic Valve: The aortic valve is tricuspid. Aortic valve regurgitation is not visualized. No aortic stenosis is present. Pulmonic Valve: The pulmonic valve was normal in structure. Pulmonic valve regurgitation is mild. No evidence of pulmonic stenosis. Aorta: The aortic root is normal in size and structure. Venous: The inferior vena cava is dilated in size with less than 50% respiratory variability, suggesting right atrial pressure of 15 mmHg. IAS/Shunts: The interatrial septum is aneurysmal. No atrial level shunt detected by color flow Doppler.  LEFT VENTRICLE PLAX 2D LVIDd:         4.40 cm     Diastology LVIDs:         3.50 cm     LV e' medial:    2.81 cm/s LV PW:         1.30 cm     LV E/e' medial:  29.9 LV IVS:        1.45 cm     LV e' lateral:   8.56 cm/s LVOT diam:     2.40 cm     LV E/e' lateral: 9.8 LV SV:         63 LV SV Index:   32 LVOT Area:  4.52 cm LV IVRT:       120 msec  LV Volumes (MOD) LV vol d, MOD A2C: 90.0 ml LV vol d, MOD A4C: 80.3 ml LV vol s, MOD A2C: 62.2 ml LV vol s, MOD A4C: 56.4 ml LV SV MOD A2C:     27.8 ml LV SV MOD A4C:     80.3 ml LV SV MOD BP:      25.7 ml RIGHT VENTRICLE            IVC RV Basal diam:  3.80 cm    IVC diam: 2.30 cm RV Mid diam:    3.10 cm RV S prime:     9.55 cm/s TAPSE (M-mode): 1.0 cm LEFT ATRIUM              Index        RIGHT ATRIUM            Index LA diam:        5.30 cm  2.69 cm/m   RA Area:     20.70 cm LA Vol (A2C):   149.0 ml 75.65 ml/m  RA Volume:   52.20 ml  26.50 ml/m LA Vol (A4C):   130.0 ml 66.00 ml/m LA Biplane Vol: 139.0 ml 70.57 ml/m  AORTIC VALVE             PULMONIC VALVE LVOT Vmax:   71.00 cm/s  PR End Diast Vel: 6.97 msec LVOT Vmean:  45.100 cm/s LVOT VTI:    0.139 m  AORTA Ao Root diam: 3.20 cm Ao Asc diam:  3.30 cm MITRAL VALVE                  TRICUSPID VALVE MV Area (PHT): 3.46 cm       TR Peak grad:   34.6 mmHg MV Area VTI:   2.28 cm       TR Vmax:        294.00 cm/s MV Peak grad:  3.3 mmHg MV Mean grad:  1.0 mmHg       SHUNTS MV Vmax:       0.90 m/s       Systemic VTI:  0.14 m MV Vmean:      55.0 cm/s      Systemic Diam: 2.40 cm MV Decel Time: 219 msec MR Peak grad:    87.2 mmHg MR Mean grad:    58.5 mmHg MR Vmax:         467.00 cm/s MR Vmean:        359.5 cm/s MR PISA:         0.57 cm MR PISA Eff ROA: 4 mm MR PISA Radius:  0.30 cm MV E velocity: 84.00 cm/s MV A velocity: 36.00 cm/s MV E/A ratio:  2.33 Morene Brownie Electronically signed by Morene Brownie Signature Date/Time: 09/04/2024/4:42:07 PM    Final    DG Chest 2 View Result Date: 09/03/2024 CLINICAL DATA:  Cough. EXAM: CHEST - 2 VIEW COMPARISON:  Chest radiograph dated 07/28/2024. FINDINGS: Cardiomegaly with vascular congestion. No focal consolidation, pleural effusion or pneumothorax. No acute pathology visualized. New the pathology. Degenerative changes spine and shoulders. IMPRESSION: Cardiomegaly with vascular congestion. No focal consolidation. Electronically Signed   By: Vanetta Chou M.D.   On: 09/03/2024 15:02    Microbiology: Results for orders placed or performed during the hospital encounter of 09/03/24  Resp panel by RT-PCR (RSV, Flu A&B, Covid) Anterior Nasal Swab     Status: None  Collection Time: 09/03/24  1:50 PM   Specimen: Anterior Nasal Swab  Result Value Ref Range Status   SARS Coronavirus 2 by RT PCR NEGATIVE NEGATIVE  Final   Influenza A by PCR NEGATIVE NEGATIVE Final   Influenza B by PCR NEGATIVE NEGATIVE Final    Comment: (NOTE) The Xpert Xpress SARS-CoV-2/FLU/RSV plus assay is intended as an aid in the diagnosis of influenza from Nasopharyngeal swab specimens and should not be used as a sole basis for treatment. Nasal washings and aspirates are unacceptable for Xpert Xpress SARS-CoV-2/FLU/RSV testing.  Fact Sheet for Patients: bloggercourse.com  Fact Sheet for Healthcare Providers: seriousbroker.it  This test is not yet approved or cleared by the [place]  FDA and has been authorized for detection and/or diagnosis of SARS-CoV-2 by FDA under an Emergency Use Authorization (EUA). This EUA will remain in effect (meaning this test can be used) for the duration of the COVID-19 declaration under Section 564(b)(1) of the Act, 21 U.S.C. section 360bbb-3(b)(1), unless the authorization is terminated or revoked.     Resp Syncytial Virus by PCR NEGATIVE NEGATIVE Final    Comment: (NOTE) Fact Sheet for Patients: bloggercourse.com  Fact Sheet for Healthcare Providers: seriousbroker.it  This test is not yet approved or cleared by the [place]  FDA and has been authorized for detection and/or diagnosis of SARS-CoV-2 by FDA under an Emergency Use Authorization (EUA). This EUA will remain in effect (meaning this test can be used) for the duration of the COVID-19 declaration under Section 564(b)(1) of the Act, 21 U.S.C. section 360bbb-3(b)(1), unless the authorization is terminated or revoked.  Performed at [facility], [address] [place], [place] [address]    Labs: CBC: Recent Labs  Lab 09/09/24 [id number] 09/10/24 0402 09/11/24 0348 09/12/24 0333  WBC 4.4 4.0 3.5* 3.4*  HGB 10.5* 9.4* 9.3* 9.4*  HCT 34.3* 30.4* 29.2* 30.6*  MCV 94.8 93.3 92.1 92.2  PLT 121* 128* 126* 143*    Basic Metabolic Panel: Recent Labs  Lab 09/08/24 0339 09/09/24 0417 09/10/24 0402 09/11/24 0348 09/12/24 0333  NA 140 139 136 139 136  K 4.3 4.8 4.4 4.2 4.2  CL 101 103 97* 101 100  CO2 26 21* 24 24 25   GLUCOSE 183* 138* 202* 139* 190*  BUN 31* 34* 40* 39* 42*  CREATININE 2.28* 2.56* 2.67* 2.48* 2.20*  CALCIUM  8.7* 8.5* 8.4* 8.4* 8.4*  MG  --  2.5* 2.4 2.5* 2.6*   Liver Function Tests: No results for input(s): AST, ALT, ALKPHOS, BILITOT, PROT, ALBUMIN  in the last 168 hours. CBG: Recent Labs  Lab 09/11/24 0611 09/11/24 1228 09/11/24 1706 09/11/24 2112 09/12/24 0615  GLUCAP 151* 215* 159* 186* 198*    Discharge time spent: greater than 30 minutes.  Author: Yetta Blanch, MD  [facility]

## 2024-09-16 ENCOUNTER — Ambulatory Visit (HOSPITAL_COMMUNITY)

## 2024-09-17 ENCOUNTER — Ambulatory Visit (HOSPITAL_COMMUNITY)

## 2024-09-18 ENCOUNTER — Telehealth (HOSPITAL_COMMUNITY): Payer: Self-pay | Admitting: *Deleted

## 2024-09-18 NOTE — Telephone Encounter (Signed)
 Called to confirm/remind patient of their appointment at the [facility] on    09/23/24:    Appointment:              [x] Confirmed             [] Left mess              [] No answer/No voice mail             [] Phone not in service   Patient reminded to bring all medications and/or complete list.   Confirmed patient has transportation. Gave directions, instructed to utilize valet parking.

## 2024-09-23 ENCOUNTER — Ambulatory Visit (HOSPITAL_COMMUNITY)
Admission: RE | Admit: 2024-09-23 | Discharge: 2024-09-23 | Disposition: A | Source: Ambulatory Visit | Attending: Cardiology

## 2024-09-23 ENCOUNTER — Ambulatory Visit (HOSPITAL_COMMUNITY): Payer: Self-pay | Admitting: Cardiology

## 2024-09-23 ENCOUNTER — Encounter (HOSPITAL_COMMUNITY): Payer: Self-pay

## 2024-09-23 VITALS — BP 136/82 | HR 64 | Ht 65.0 in | Wt 196.0 lb

## 2024-09-23 DIAGNOSIS — I5022 Chronic systolic (congestive) heart failure: Secondary | ICD-10-CM | POA: Diagnosis present

## 2024-09-23 DIAGNOSIS — I340 Nonrheumatic mitral (valve) insufficiency: Secondary | ICD-10-CM | POA: Diagnosis not present

## 2024-09-23 DIAGNOSIS — E1122 Type 2 diabetes mellitus with diabetic chronic kidney disease: Secondary | ICD-10-CM | POA: Diagnosis not present

## 2024-09-23 DIAGNOSIS — I442 Atrioventricular block, complete: Secondary | ICD-10-CM | POA: Insufficient documentation

## 2024-09-23 DIAGNOSIS — I130 Hypertensive heart and chronic kidney disease with heart failure and stage 1 through stage 4 chronic kidney disease, or unspecified chronic kidney disease: Secondary | ICD-10-CM | POA: Diagnosis not present

## 2024-09-23 DIAGNOSIS — Z7901 Long term (current) use of anticoagulants: Secondary | ICD-10-CM | POA: Diagnosis not present

## 2024-09-23 DIAGNOSIS — Z79899 Other long term (current) drug therapy: Secondary | ICD-10-CM | POA: Insufficient documentation

## 2024-09-23 DIAGNOSIS — Z950 Presence of cardiac pacemaker: Secondary | ICD-10-CM | POA: Diagnosis not present

## 2024-09-23 DIAGNOSIS — Z86718 Personal history of other venous thrombosis and embolism: Secondary | ICD-10-CM | POA: Diagnosis not present

## 2024-09-23 DIAGNOSIS — I4821 Permanent atrial fibrillation: Secondary | ICD-10-CM | POA: Insufficient documentation

## 2024-09-23 DIAGNOSIS — N1832 Chronic kidney disease, stage 3b: Secondary | ICD-10-CM | POA: Insufficient documentation

## 2024-09-23 DIAGNOSIS — I447 Left bundle-branch block, unspecified: Secondary | ICD-10-CM | POA: Insufficient documentation

## 2024-09-23 LAB — BASIC METABOLIC PANEL WITH GFR
Anion gap: 11 (ref 5–15)
BUN: 36 mg/dL — ABNORMAL HIGH (ref 8–23)
CO2: 29 mmol/L (ref 22–32)
Calcium: 8.8 mg/dL — ABNORMAL LOW (ref 8.9–10.3)
Chloride: 102 mmol/L (ref 98–111)
Creatinine, Ser: 1.78 mg/dL — ABNORMAL HIGH (ref 0.44–1.00)
GFR, Estimated: 27 mL/min — ABNORMAL LOW (ref >60)
Glucose, Bld: 204 mg/dL — ABNORMAL HIGH (ref 70–99)
Potassium: 3.8 mmol/L (ref 3.5–5.1)
Sodium: 142 mmol/L (ref 135–145)

## 2024-09-23 LAB — BRAIN NATRIURETIC PEPTIDE: B Natriuretic Peptide: 665.8 pg/mL — ABNORMAL HIGH (ref 0.0–100.0)

## 2024-09-23 NOTE — Patient Instructions (Signed)
 Medication Changes:  No Changes In Medications at this time.   Lab Work:  Labs done today, your results will be available in MyChart, we will contact you for abnormal readings.  Special Instructions // Education:  PLEASE PLACE UNNA BOOTS   Follow-Up in: 4 WEEKS WITH APP CLINIC   At the [facility], you and your health needs are our priority. We have a designated team specialized in the treatment of Heart Failure. This Care Team includes your primary Heart Failure Specialized Cardiologist (physician), Advanced Practice Providers (APPs- Physician Assistants and Nurse Practitioners), and Pharmacist who all work together to provide you with the care you need, when you need it.   You may see any of the following providers on your designated Care Team at your next follow up:  Dr. Toribio Fuel Dr. Ezra Shuck Dr. Odis Brownie Greig Mosses, NP Caffie Shed, [place] [facility] [place], [place] Beckey Coe, NP Jordan Lee, NP Tinnie Redman, PharmD   Please be sure to bring in all your medications bottles to every appointment.   Need to Contact Us :  If you have any questions or concerns before your next appointment please send us  a message through [place] or call our office at [phone].    TO LEAVE A MESSAGE FOR THE NURSE SELECT OPTION 2, PLEASE LEAVE A MESSAGE INCLUDING: YOUR NAME DATE OF BIRTH CALL BACK NUMBER REASON FOR CALL**this is important as we prioritize the call backs  YOU WILL RECEIVE A CALL BACK THE SAME DAY AS LONG AS YOU CALL BEFORE 4:00 PM

## 2024-09-23 NOTE — Progress Notes (Signed)
 HEART & VASCULAR TRANSITION OF CARE CONSULT NOTE     Referring Physician: Dr. Pietro   Chief Complaint: Chronic Systolic Heart Failure   HPI: Referred to clinic by Dr. Pietro for heart failure consultation.   89 y.o. female with a hx of permanent A-fib, SVT, complete heart block s/p PPM, known LBBB, chronic combined CHF, history of DVT on Eliquis , hypertension, CKD stage 3b, chronic anemia/thrombocytopenia and diabetes, who was admitted 11/25 w/ acute on chronic CHF. Echo showed EF 30 to 35%, moderate left ventricular hypertrophy, grade 3 diastolic dysfunction, moderate pulmonary hypertension (RVSP 49 mmHg), small pericardial fusion, severe left atrial enlargement, mild mitral regurgitation, RV normal. Prior Echo 3/24 EF 45-50%. She has never had a cardiac MRI.   She was diuresed w/ IV lasix  and developed an AKI, felt d/t overdiuresis. SCr had bumped from b/l of 1.58>>2.7. Diuretics were held x 2 days and SCr demonstrated downward trend and she was resumed on PO torsemide . SCr down to 2.20 by day of d/c. HF GDMT limited by CKD. She was placed on Bidil  and Coreg  for BP/afterload reduction. She was discharged on 11/20. D/c wt 192 lb.   She presents today for post hospital f/u. Resides at [facility]. Here w/ her granddaughter. Overall feels better w/ improved breathing, no dyspnea at rest. Nocturnal cough has also resolved. No PND. She however is not very active at baseline. She is wheel chair dependent. Mobility limited by chronic orthopedic issues/ knee pain. She does report some mild LEE, though improved from when she was admitted to the hospital. She does not like wearing TED hoses, says they are uncomfortable. Wt is up 4 lb since d/c at 196 lb. I personally reviewed SNF MAR, she has been getting prescribed meds as directed.   BP today 136/82. She denies any h/o chest pain.   Of note, recent device interrogation noted 55% RV pacing.     Cardiac Testing   2D Echo 11/25 1. Left  ventricular ejection fraction, by estimation, is 30 to 35%. The  left ventricle has moderately decreased function. The left ventricle has  no regional wall motion abnormalities. There is moderate concentric left  ventricular hypertrophy. Left  ventricular diastolic parameters are consistent with Grade III diastolic  dysfunction (restrictive).   2. Right ventricular systolic function is mildly reduced. The right  ventricular size is normal. There is moderately elevated pulmonary artery  systolic pressure. The estimated right ventricular systolic pressure is  49.6 mmHg.   3. Left atrial size was severely dilated.   4. A small pericardial effusion is present.   5. The mitral valve is normal in structure. Mild mitral valve  regurgitation. No evidence of mitral stenosis.   6. The aortic valve is tricuspid. Aortic valve regurgitation is not  visualized. No aortic stenosis is present.   7. The inferior vena cava is dilated in size with <50% respiratory  variability, suggesting right atrial pressure of 15 mmHg.    Past Medical History:  Diagnosis Date   Abnormal echocardiogram    a. possible mass on echo 05/2019 on PPM, b. 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 Previously diastolic but patient AVS from outside hospital listed systolic CHF and  cardiomyopathy, records pending.   CKD (chronic kidney disease), stage III (HCC)    Clotting disorder    CVA (cerebral vascular accident) (HCC)    residual right sided weakness and mild dysphagia   Diabetes mellitus (HCC)    Dizziness and giddiness    DVT (deep venous thrombosis) (HCC)    a. on anticoagulation for this.   Essential hypertension    LBBB (left bundle branch block)    Mitral regurgitation    a. Mod MR by echo  2014.   Muscular deconditioning    Obesity    Pacemaker    Pericardial effusion    SVT (supraventricular tachycardia)    a. In 2013 she had an EPS with ablation for SVT which did not eliminate the SVT completely. She also had bradycardia which limited medication. She was placed on amiodarone  by Dr. Waddell.   Thrombocytopenia 11/21/2011    Current Outpatient Medications  Medication Sig Dispense Refill   acetaminophen  (TYLENOL ) 325 MG tablet Take 650 mg by mouth in the morning, at noon, and at bedtime.     amiodarone  (PACERONE ) 200 MG tablet Take one tablet by mouth daily Monday-Friday. Do NOT take Saturday and Sunday 90 tablet 3   carvedilol  (COREG ) 6.25 MG tablet Take 6.25 mg by mouth 2 (two) times daily with a meal.     Docusate Sodium  100 MG capsule Take 100 mg by mouth every morning.     ELIQUIS  2.5 MG TABS tablet TAKE 1 TABLET(2.5 MG) BY MOUTH TWICE DAILY 60 tablet 2   fluticasone-salmeterol (ADVAIR) 250-50 MCG/ACT AEPB Inhale 1 puff into the lungs 2 (two) times daily.     insulin  glargine (LANTUS ) 100 UNIT/ML injection Inject 7-12 Units into the skin See admin instructions. Inject 12 units into the skin in the morning and 7 units in the evening     insulin  lispro (HUMALOG  KWIKPEN) 100 UNIT/ML KwikPen Before each meal 3 times a day, 140-199 - 2 units, 200-250 - 4 units, 251-299 - 6 units,  300-349 - 8 units,  350 or above 10 units. (Patient taking differently: Inject 2-10 Units into the skin in the morning and at bedtime. If 150-199= 2 units, 200-250= 4 units, 251-299= 6 units, 301-349= 8 units, 350-399= 10 units, recheck 1 hour >400 call [facility] provider)     ipratropium-albuterol  (DUONEB) 0.5-2.5 (3) MG/3ML SOLN Take 3 mLs by nebulization every 4 (four) hours as needed (for shortness of breath).     isosorbide -hydrALAZINE  (BIDIL ) 20-37.5 MG tablet Take 1 tablet by mouth 3 (three) times daily. 90 tablet 0   lidocaine (LIDODERM) 5 % Place 1 patch onto the skin daily. Apply to left posterior  shoulder--Remove & Discard patch within 12 hours or as directed by MD     Menthol, Topical Analgesic, (BIOFREEZE COOL THE PAIN) 4 % GEL Apply 1 application  topically every 12 (twelve) hours as needed (for osteoarthritis).     mirabegron  ER (MYRBETRIQ ) 25 MG TB24 tablet Take 25 mg by mouth daily.     omeprazole  (PRILOSEC OTC) 20 MG tablet Take 20 mg by mouth every morning.     polyethylene glycol (MIRALAX  / GLYCOLAX ) 17 g packet Take 17 g by mouth daily as needed for mild constipation.     pregabalin  (LYRICA ) 25 MG capsule Take 1 capsule (25 mg total) by mouth 2 (two) times daily. 60 capsule 0   Propylene Glycol (SYSTANE COMPLETE OP) Place 1 drop into both eyes as needed (for itching and dry eye).  simethicone  (MYLICON) 80 MG chewable tablet Chew 1 tablet (80 mg total) by mouth 4 (four) times daily. 30 tablet 0   torsemide  (DEMADEX ) 20 MG tablet Take 3 tablets (60 mg total) by mouth daily. Take additional 20 mg daily as needed for weight gain of 3 lbs in 1 day or 5 lbs in 1 week 90 tablet 0   No current facility-administered medications for this encounter.    Allergies  Allergen Reactions   Aspirin Itching   Penicillins Itching and Rash    DID THE REACTION INVOLVE: Swelling of the face/tongue/throat, SOB, or low BP? Yes Sudden or severe rash/hives, skin peeling, or the inside of the mouth or nose? No Did it require medical treatment? Yes When did it last happen? More than 10 years ago If all above answers are NO, may proceed with cephalosporin use.  Tolerated full course of Unasyn  in 2021.   Remeron  [Mirtazapine ] Other (See Comments)    Unknown reaction      Social History   Socioeconomic History   Marital status: Widowed    Spouse name: Not on file   Number of children: 3   Years of education: Not on file   Highest education level: High school graduate  Occupational History   Occupation: retired  Tobacco Use   Smoking status: Never   Smokeless tobacco: Never  Vaping  Use   Vaping status: Never Used  Substance and Sexual Activity   Alcohol use: No   Drug use: No   Sexual activity: Not Currently  Other Topics Concern   Not on file  Social History Narrative   Lives at home with grandchild, daughter comes and help    Social Drivers of Health   Financial Resource Strain: Low Risk  (09/05/2024)   Overall Financial Resource Strain (CARDIA)    Difficulty of Paying Living Expenses: Not very hard  Food Insecurity: No Food Insecurity (09/04/2024)   Hunger Vital Sign    Worried About Running Out of Food in the Last Year: Never true    Ran Out of Food in the Last Year: Never true  Transportation Needs: No Transportation Needs (09/04/2024)   PRAPARE - Administrator, Civil Service (Medical): No    Lack of Transportation (Non-Medical): No  Physical Activity: Inactive (04/02/2020)   Exercise Vital Sign    Days of Exercise per Week: 0 days    Minutes of Exercise per Session: 0 min  Stress: No Stress Concern Present (04/02/2020)   [employer] of Occupational Health - Occupational Stress Questionnaire    Feeling of Stress : Only a little  Social Connections: Moderately Integrated (09/04/2024)   Social Connection and Isolation Panel    Frequency of Communication with Friends and Family: Three times a week    Frequency of Social Gatherings with Friends and Family: Three times a week    Attends Religious Services: 1 to 4 times per year    Active Member of Clubs or Organizations: No    Attends Banker Meetings: 1 to 4 times per year    Marital Status: Widowed  Intimate Partner Violence: Not At Risk (09/04/2024)   Humiliation, Afraid, Rape, and Kick questionnaire    Fear of Current or Ex-Partner: No    Emotionally Abused: No    Physically Abused: No    Sexually Abused: No      Family History  Problem Relation Age of Onset   Stroke Mother    Cancer Father  prostate   Hypertension Daughter    Heart attack Brother         x2   Hypertension Brother    Stroke Sister    Hypertension Sister    Breast cancer Maternal Aunt     Vitals:   09/23/24 1048  BP: 136/82  Pulse: 64  SpO2: 95%  Weight: 88.9 kg (196 lb)  Height: 5' 5 (1.651 m)   Physical Exam  GENERAL: Elderly, in WC, NAD Lungs- clear  CARDIAC:  JVP 7 cm          Irregularly irregular rhythm and rate. No MRG. 1+ b/l ankle edema L>R  ABDOMEN: Soft, non-tender, non-distended.  EXTREMITIES: Warm and well perfused.  NEUROLOGIC: No obvious FND   ECG: not performed.    ASSESSMENT & PLAN:  1. Chronic Combined Systolic and Diastolic Heart Failure - recent drop in EF, prior Echo 3/24 EF 45-50% - Echo 11/25 EF 30-35%, RV normal - She denies ischemic CP and not candidate for LHC w/ CKD  - ? Pacer mediated CM as she had > 20% RV pacing on last interrogation (55%). She also has known LBBB, recent EKG QRS 154 ms. Will refer back to EP clinic to see if device settings can be adjusted to limit RV pacing or if candidate for upgrade to left bundle lead.  - Given h/o conduction disorder including bradycardia necessitating PPM, Afib, SVT as well as combined systolic/diastolic heart failure w/ mod LVH, CKD and chronic anemia, consider w/u to exclude an infiltrative process such as amyloidosis. Use of cMRI for diagnostic testing would be dependent on pacemaker compatibility, will check w/ EP. Would recommend starting w/ PYP testing at cardiology f/u. I will arrange for SPEP and multiple myeloma labs today.  - Unable to ascertain NYHA functional class as she is WC dependent, but she denies any resting dyspnea - Has mild LEE on exam but not markedly volume overloaded. I will order for Unna boots to be placed to help w/ residual LEE - continue Torsemide  60 mg daily and can take extra 20 mg PRN for wt gain  - Check BMP and BNP today  - GDMT limited by CKD - continue Bidil  1 tab tid - continue Coreg  6.25 mg bid  - continue sodium/fluid restriction    2.  Permanent Atrial Fibrillation  - rate controlled, continue Coreg  and amiodarone  - anticoagulated w/ Eliquis , 2.5 mg bid d/t age and renal fx    3. CKD IIIb w/ Recent AKI  - followed by [facility] - previous b/l SCr ~1.7 but bumped to  2.7 recent hospitalization w/ downward trend by discharge. Felt d/t overdiuresis - obtain f/u BMP today  - no SGLT2i given limited mobility/risk for GU infections   4. Bradycardia/CHB s/p PPM - followed by EP  - see discussion above     Referred to HFSW (PCP, Medications, Transportation, ETOH Abuse, Drug Abuse, Insurance, Financial ): No  Refer to Pharmacy: No  Refer to Home Health: No  Refer to Advanced Heart Failure Clinic: No  Refer to General Cardiology: Yes (already established)   Follow up  w/ gen cards in 4 wks. Recommend arranging PYP at f/u.   Caffie Shed, PA-C  09/23/2024

## 2024-09-24 NOTE — Telephone Encounter (Signed)
 Sam with [facility] aware and voiced understanding. As requested orders and lab results tfaxed to [phone]

## 2024-09-25 LAB — KAPPA/LAMBDA LIGHT CHAINS
Kappa free light chain: 38 mg/L — ABNORMAL HIGH (ref 3.3–19.4)
Kappa, lambda light chain ratio: 1.66 — ABNORMAL HIGH (ref 0.26–1.65)
Lambda free light chains: 22.9 mg/L (ref 5.7–26.3)

## 2024-10-01 LAB — MULTIPLE MYELOMA PANEL, SERUM
Albumin SerPl Elph-Mcnc: 3.3 g/dL (ref 2.9–4.4)
Albumin/Glob SerPl: 1 (ref 0.7–1.7)
Alpha 1: 0.3 g/dL (ref 0.0–0.4)
Alpha2 Glob SerPl Elph-Mcnc: 0.9 g/dL (ref 0.4–1.0)
B-Globulin SerPl Elph-Mcnc: 1.2 g/dL (ref 0.7–1.3)
Gamma Glob SerPl Elph-Mcnc: 1.2 g/dL (ref 0.4–1.8)
Globulin, Total: 3.6 g/dL (ref 2.2–3.9)
IgA: 289 mg/dL (ref 64–422)
IgG (Immunoglobin G), Serum: 1033 mg/dL (ref 586–1602)
IgM (Immunoglobulin M), Srm: 197 mg/dL (ref 26–217)
Total Protein ELP: 6.9 g/dL (ref 6.0–8.5)

## 2024-10-04 ENCOUNTER — Encounter (HOSPITAL_COMMUNITY): Payer: Self-pay

## 2024-10-04 NOTE — Progress Notes (Unsigned)
 Received lab work   Potassium 3.7  BUN/Creat 30.1/1.58  Reviewed by DOROTHA Gainer NP

## 2024-10-23 ENCOUNTER — Telehealth (HOSPITAL_COMMUNITY): Payer: Self-pay

## 2024-10-23 NOTE — Progress Notes (Signed)
 "   HEART & VASCULAR TRANSITION OF CARE NOTE   Referring Physician: Dr. Pietro  Primary Cardiologist: Dr. Jeffrie EP: Dr. Waddell  HPI: Referred to clinic by Dr. Pietro for heart failure consultation.   89 y.o. female with a hx of permanent A-fib, SVT, complete heart block s/p PPM, known LBBB, chronic combined CHF, history of DVT on Eliquis , hypertension, CKD stage 3b, chronic anemia/thrombocytopenia and diabetes.  She was admitted 11/25 w/ acute on chronic CHF. Echo showed EF 30-35%, moderate left ventricular hypertrophy, G3DD, moderate pulmonary hypertension (RVSP 49 mmHg), small pericardial fusion, severe left atrial enlargement, mild mitral regurgitation, RV normal. Prior Echo 3/24 EF 45-50%. She has never had a cardiac MRI.   She was diuresed w/ IV lasix  and developed an AKI, felt d/t overdiuresis. SCr had bumped from b/l of 1.58>>2.7. Diuretics were held x 2 days and SCr demonstrated downward trend and she was resumed on PO torsemide . SCr down to 2.20 by day of d/c. HF GDMT limited by CKD. She was placed on Bidil  and Coreg  for BP/afterload reduction. She was discharged on 11/20. D/c wt 192 lb.   Initial TOC visit 12/25, she was mildly volume overloaded. Unna boots arranged at SNF, and instructed to use extra 20 mg torsemide  PRN.  Today she returns for HF follow up. Overall feeling fine. She is mostly WC-bound, but can pivot for transfers. She has compression hose, did not tolerate unna boots. She has BLE edema and SOB with transfers out of her WC. Denies palpitations, abnormal bleeding, CP, dizziness,or PND/Orthopnea. Taking all medications provided by facility.   Cardiac Testing  - Echo 11/25: EF 30-35%, G3DD, RV mildly reduced, mild MR - Echo 3/24: EF 45-50%   Past Medical History:  Diagnosis Date   Abnormal echocardiogram    a. possible mass on echo 05/2019 on PPM, b. no evidence of mass / atrial lead vegetation on follow up echo 06/2019   Anemia    Arthritis    Breast cancer  (HCC) 10/2018   Invasive ductal carcinoma   Cancer (HCC)    Cataract    Chronic combined systolic and diastolic CHF (congestive heart failure) (HCC)    a. Previously diastolic but patient AVS from outside hospital listed systolic CHF and cardiomyopathy, records pending.   CKD (chronic kidney disease), stage III (HCC)    Clotting disorder    CVA (cerebral vascular accident) (HCC)    residual right sided weakness and mild dysphagia   Diabetes mellitus (HCC)    Dizziness and giddiness    DVT (deep venous thrombosis) (HCC)    a. on anticoagulation for this.   Essential hypertension    LBBB (left bundle branch block)    Mitral regurgitation    a. Mod MR by echo 2014.   Muscular deconditioning    Obesity    Pacemaker    Pericardial effusion    SVT (supraventricular tachycardia)    a. In 2013 she had an EPS with ablation for SVT which did not eliminate the SVT completely. She also had bradycardia which limited medication. She was placed on amiodarone  by Dr. Waddell.   Thrombocytopenia 11/21/2011   Current Outpatient Medications  Medication Sig Dispense Refill   acetaminophen  (TYLENOL ) 325 MG tablet Take 650 mg by mouth in the morning, at noon, and at bedtime.     amiodarone  (PACERONE ) 200 MG tablet Take one tablet by mouth daily Monday-Friday. Do NOT take Saturday and Sunday 90 tablet 3   carvedilol  (COREG ) 6.25 MG tablet Take  6.25 mg by mouth 2 (two) times daily with a meal.     Docusate Sodium  100 MG capsule Take 100 mg by mouth every morning.     ELIQUIS  2.5 MG TABS tablet TAKE 1 TABLET(2.5 MG) BY MOUTH TWICE DAILY 60 tablet 2   fluticasone-salmeterol (ADVAIR) 250-50 MCG/ACT AEPB Inhale 1 puff into the lungs 2 (two) times daily.     insulin  glargine (LANTUS ) 100 UNIT/ML injection Inject 7-12 Units into the skin See admin instructions. Inject 12 units into the skin in the morning and 7 units in the evening     insulin  lispro (HUMALOG  KWIKPEN) 100 UNIT/ML KwikPen Before each meal 3 times a  day, 140-199 - 2 units, 200-250 - 4 units, 251-299 - 6 units,  300-349 - 8 units,  350 or above 10 units. (Patient taking differently: Inject 2-10 Units into the skin in the morning and at bedtime. If 150-199= 2 units, 200-250= 4 units, 251-299= 6 units, 301-349= 8 units, 350-399= 10 units, recheck 1 hour >400 call [facility] provider)     ipratropium-albuterol  (DUONEB) 0.5-2.5 (3) MG/3ML SOLN Take 3 mLs by nebulization every 4 (four) hours as needed (for shortness of breath).     isosorbide -hydrALAZINE  (BIDIL ) 20-37.5 MG tablet Take 1 tablet by mouth 3 (three) times daily. 90 tablet 0   lidocaine (LIDODERM) 5 % Place 1 patch onto the skin daily. Apply to left posterior shoulder--Remove & Discard patch within 12 hours or as directed by MD     Menthol, Topical Analgesic, (BIOFREEZE COOL THE PAIN) 4 % GEL Apply 1 application  topically every 12 (twelve) hours as needed (for osteoarthritis).     mirabegron  ER (MYRBETRIQ ) 25 MG TB24 tablet Take 25 mg by mouth daily.     omeprazole  (PRILOSEC OTC) 20 MG tablet Take 20 mg by mouth every morning.     polyethylene glycol (MIRALAX  / GLYCOLAX ) 17 g packet Take 17 g by mouth daily as needed for mild constipation.     pregabalin  (LYRICA ) 25 MG capsule Take 1 capsule (25 mg total) by mouth 2 (two) times daily. 60 capsule 0   Propylene Glycol (SYSTANE COMPLETE OP) Place 1 drop into both eyes as needed (for itching and dry eye).     simethicone  (MYLICON) 80 MG chewable tablet Chew 1 tablet (80 mg total) by mouth 4 (four) times daily. 30 tablet 0   torsemide  (DEMADEX ) 20 MG tablet Take 3 tablets (60 mg total) by mouth daily. Take additional 20 mg daily as needed for weight gain of 3 lbs in 1 day or 5 lbs in 1 week (Patient taking differently: Take 80 mg by mouth daily. Take additional 20 mg daily as needed for weight gain of 3 lbs in 1 day or 5 lbs in 1 week) 90 tablet 0   No current facility-administered medications for this encounter.   Allergies  Allergen Reactions    Aspirin Itching   Penicillins Itching and Rash    DID THE REACTION INVOLVE: Swelling of the face/tongue/throat, SOB, or low BP? Yes Sudden or severe rash/hives, skin peeling, or the inside of the mouth or nose? No Did it require medical treatment? Yes When did it last happen? More than 10 years ago If all above answers are NO, may proceed with cephalosporin use.  Tolerated full course of Unasyn  in 2021.   Remeron  [Mirtazapine ] Other (See Comments)    Unknown reaction   Social History   Socioeconomic History   Marital status: Widowed    Spouse name:  Not on file   Number of children: 3   Years of education: Not on file   Highest education level: High school graduate  Occupational History   Occupation: retired  Tobacco Use   Smoking status: Never   Smokeless tobacco: Never  Vaping Use   Vaping status: Never Used  Substance and Sexual Activity   Alcohol use: No   Drug use: No   Sexual activity: Not Currently  Other Topics Concern   Not on file  Social History Narrative   Lives at home with grandchild, daughter comes and help    Social Drivers of Health   Tobacco Use: Low Risk (10/25/2024)   Patient History    Smoking Tobacco Use: Never    Smokeless Tobacco Use: Never    Passive Exposure: Not on file  Financial Resource Strain: Low Risk (09/05/2024)   Overall Financial Resource Strain (CARDIA)    Difficulty of Paying Living Expenses: Not very hard  Food Insecurity: No Food Insecurity (09/04/2024)   Epic    Worried About Radiation Protection Practitioner of Food in the Last Year: Never true    Ran Out of Food in the Last Year: Never true  Transportation Needs: No Transportation Needs (09/04/2024)   Epic    Lack of Transportation (Medical): No    Lack of Transportation (Non-Medical): No  Physical Activity: Not on file  Stress: Not on file  Social Connections: Moderately Integrated (09/04/2024)   Social Connection and Isolation Panel    Frequency of Communication with Friends and  Family: Three times a week    Frequency of Social Gatherings with Friends and Family: Three times a week    Attends Religious Services: 1 to 4 times per year    Active Member of Clubs or Organizations: No    Attends Banker Meetings: 1 to 4 times per year    Marital Status: Widowed  Intimate Partner Violence: Not At Risk (09/04/2024)   Epic    Fear of Current or Ex-Partner: No    Emotionally Abused: No    Physically Abused: No    Sexually Abused: No  Depression (PHQ2-9): Not on file  Alcohol Screen: Low Risk (09/05/2024)   Alcohol Screen    Last Alcohol Screening Score (AUDIT): 0  Housing: Unknown (09/04/2024)   Epic    Unable to Pay for Housing in the Last Year: No    Number of Times Moved in the Last Year: 0    Homeless in the Last Year: Not on file  Recent Concern: Housing - High Risk (09/04/2024)   Epic    Unable to Pay for Housing in the Last Year: Yes    Number of Times Moved in the Last Year: Not on file    Homeless in the Last Year: No  Utilities: Not At Risk (09/04/2024)   Epic    Threatened with loss of utilities: No  Health Literacy: Not on file   Family History  Problem Relation Age of Onset   Stroke Mother    Cancer Father        prostate   Hypertension Daughter    Heart attack Brother        x2   Hypertension Brother    Stroke Sister    Hypertension Sister    Breast cancer Maternal Aunt    Wt Readings from Last 3 Encounters:  10/25/24 92.5 kg (204 lb)  09/23/24 88.9 kg (196 lb)  09/12/24 87.2 kg (192 lb 3.9 oz)   BP 130/64  Pulse 62   Ht 5' 5 (1.651 m)   Wt 92.5 kg (204 lb)   SpO2 90%   BMI 33.95 kg/m   Physical Exam  General:  NAD. No resp difficulty, arrived in [facility], elderly HEENT: Normal Neck: Supple. JVP 10 Cor: Irregular rate & rhythm. No rubs, gallops or murmurs. Lungs: Clear Abdomen: Obese, nontender, nondistended.  Extremities: No cyanosis, clubbing, rash, 2+ BLE pre-tibial LE edema Neuro: Alert & oriented x 3, moves  all 4 extremities w/o difficulty. Affect pleasant.  ReDs reading: 45 %, abnormal  Device interrogation: unable to interrogate device today.  ASSESSMENT & PLAN: 1. Chronic Combined Systolic and Diastolic Heart Failure - Echo 6/75 EF 45-50% - Echo 11/25 EF 30-35%, RV normal - She denies ischemic CP and not candidate for LHC w/ CKD  - ? Pacer mediated CM as she had > 20% RV pacing on last interrogation (55%). She also has known LBBB, recent EKG QRS 154 ms. She has been referred back to EP clinic to see if device settings can be adjusted to limit RV pacing or if candidate for upgrade to left bundle lead.  - Given h/o conduction disorder including bradycardia necessitating PPM, Afib, SVT as well as combined systolic/diastolic heart failure w/ mod LVH, CKD and chronic anemia, consider w/u to exclude an infiltrative process such as amyloidosis.  - Use of cMRI for diagnostic testing would be dependent on pacemaker compatibility.Would recommend starting w/ PYP testing at cardiology f/u. SPEP and multiple myeloma labs unremarkble, and with no M spike. - Functional status difficult due to mobility issues and WC-bound status, but suspect chronically NYHA III, she is volume overloaded today. Weight is up and ReDs 45% - GDMT limited by CKD - Continue compression hose. - Increase torsemide  to 60 mg q am and 40 mg q pm, add 20 KCL daily. - Continue Coreg  6.25 mg bid - Continue BiDil  1 tab tid - Avoiding SGLT2i with frailty and limited mobility - Consider ARB and MRA if/when renal function settles out. - Labs today, recommend repeat BMET at cards follow up on 10/29/24  2. Permanent Atrial Fibrillation  - rate controlled - Continue Coreg . - Continue amiodarone  200 mg M-F, per EP - Continue Eliquis  2.5 mg bid (age and SCr). No bleeding issues.  3. CKD IIIb  - followed by [facility] - Baseline SCr 1.7 - no SGLT2i given limited mobility/risk for GU infections   4. Bradycardia/CHB s/p PPM - followed by EP  -  see discussion above   Referred to HFSW (PCP, Medications, Transportation, ETOH Abuse, Drug Abuse, Insurance, Financial ): No  Refer to Pharmacy: No  Refer to Home Health: No, resides at SNF Refer to Advanced Heart Failure Clinic: No  Refer to General Cardiology: No, established patient.  Follow up PRN with AHF clinic. She has scheduled follow up w/ general cardiology.   Harlene HERO [place], FNP-BC 10/25/2024  "

## 2024-10-23 NOTE — Telephone Encounter (Signed)
 Called to confirm/remind patient of their appointment at the [facility] on 10/25/24.   Appointment:   [] Confirmed  [x] Left mess   [] No answer/No voice mail  [] VM Full/unable to leave message  [] Phone not in service  And to bring in all medications and/or complete list.

## 2024-10-25 ENCOUNTER — Ambulatory Visit (HOSPITAL_COMMUNITY)
Admission: RE | Admit: 2024-10-25 | Discharge: 2024-10-25 | Disposition: A | Discharge status: Home / Self Care | Source: Ambulatory Visit | Attending: Family Medicine | Admitting: Family Medicine

## 2024-10-25 ENCOUNTER — Ambulatory Visit (HOSPITAL_COMMUNITY): Payer: Self-pay | Admitting: Family Medicine

## 2024-10-25 ENCOUNTER — Encounter (HOSPITAL_COMMUNITY): Payer: Self-pay

## 2024-10-25 VITALS — BP 130/64 | HR 62 | Ht 65.0 in | Wt 204.0 lb

## 2024-10-25 DIAGNOSIS — I428 Other cardiomyopathies: Secondary | ICD-10-CM | POA: Insufficient documentation

## 2024-10-25 DIAGNOSIS — Z79899 Other long term (current) drug therapy: Secondary | ICD-10-CM | POA: Insufficient documentation

## 2024-10-25 DIAGNOSIS — I272 Pulmonary hypertension, unspecified: Secondary | ICD-10-CM | POA: Insufficient documentation

## 2024-10-25 DIAGNOSIS — I130 Hypertensive heart and chronic kidney disease with heart failure and stage 1 through stage 4 chronic kidney disease, or unspecified chronic kidney disease: Secondary | ICD-10-CM | POA: Diagnosis not present

## 2024-10-25 DIAGNOSIS — I5042 Chronic combined systolic (congestive) and diastolic (congestive) heart failure: Secondary | ICD-10-CM | POA: Insufficient documentation

## 2024-10-25 DIAGNOSIS — I4821 Permanent atrial fibrillation: Secondary | ICD-10-CM | POA: Diagnosis not present

## 2024-10-25 DIAGNOSIS — R001 Bradycardia, unspecified: Secondary | ICD-10-CM | POA: Insufficient documentation

## 2024-10-25 DIAGNOSIS — Z950 Presence of cardiac pacemaker: Secondary | ICD-10-CM | POA: Diagnosis not present

## 2024-10-25 DIAGNOSIS — R0602 Shortness of breath: Secondary | ICD-10-CM | POA: Insufficient documentation

## 2024-10-25 DIAGNOSIS — D696 Thrombocytopenia, unspecified: Secondary | ICD-10-CM | POA: Insufficient documentation

## 2024-10-25 DIAGNOSIS — D631 Anemia in chronic kidney disease: Secondary | ICD-10-CM | POA: Diagnosis not present

## 2024-10-25 DIAGNOSIS — I340 Nonrheumatic mitral (valve) insufficiency: Secondary | ICD-10-CM | POA: Diagnosis not present

## 2024-10-25 DIAGNOSIS — Z86718 Personal history of other venous thrombosis and embolism: Secondary | ICD-10-CM | POA: Insufficient documentation

## 2024-10-25 DIAGNOSIS — N1832 Chronic kidney disease, stage 3b: Secondary | ICD-10-CM | POA: Insufficient documentation

## 2024-10-25 DIAGNOSIS — I5022 Chronic systolic (congestive) heart failure: Secondary | ICD-10-CM | POA: Diagnosis not present

## 2024-10-25 DIAGNOSIS — I447 Left bundle-branch block, unspecified: Secondary | ICD-10-CM | POA: Insufficient documentation

## 2024-10-25 DIAGNOSIS — I442 Atrioventricular block, complete: Secondary | ICD-10-CM | POA: Diagnosis not present

## 2024-10-25 DIAGNOSIS — R600 Localized edema: Secondary | ICD-10-CM | POA: Diagnosis not present

## 2024-10-25 DIAGNOSIS — E1122 Type 2 diabetes mellitus with diabetic chronic kidney disease: Secondary | ICD-10-CM | POA: Insufficient documentation

## 2024-10-25 DIAGNOSIS — Z7901 Long term (current) use of anticoagulants: Secondary | ICD-10-CM | POA: Diagnosis not present

## 2024-10-25 DIAGNOSIS — N183 Chronic kidney disease, stage 3 unspecified: Secondary | ICD-10-CM

## 2024-10-25 DIAGNOSIS — Z993 Dependence on wheelchair: Secondary | ICD-10-CM | POA: Insufficient documentation

## 2024-10-25 LAB — BASIC METABOLIC PANEL WITH GFR
Anion gap: 10 (ref 5–15)
BUN: 33 mg/dL — ABNORMAL HIGH (ref 8–23)
CO2: 31 mmol/L (ref 22–32)
Calcium: 9 mg/dL (ref 8.9–10.3)
Chloride: 103 mmol/L (ref 98–111)
Creatinine, Ser: 1.98 mg/dL — ABNORMAL HIGH (ref 0.44–1.00)
GFR, Estimated: 24 mL/min — ABNORMAL LOW
Glucose, Bld: 163 mg/dL — ABNORMAL HIGH (ref 70–99)
Potassium: 4.7 mmol/L (ref 3.5–5.1)
Sodium: 144 mmol/L (ref 135–145)

## 2024-10-25 LAB — CBC
HCT: 30.8 % — ABNORMAL LOW (ref 36.0–46.0)
Hemoglobin: 9.3 g/dL — ABNORMAL LOW (ref 12.0–15.0)
MCH: 28.9 pg (ref 26.0–34.0)
MCHC: 30.2 g/dL (ref 30.0–36.0)
MCV: 95.7 fL (ref 80.0–100.0)
Platelets: 139 K/uL — ABNORMAL LOW (ref 150–400)
RBC: 3.22 MIL/uL — ABNORMAL LOW (ref 3.87–5.11)
RDW: 15.9 % — ABNORMAL HIGH (ref 11.5–15.5)
WBC: 2.9 K/uL — ABNORMAL LOW (ref 4.0–10.5)
nRBC: 0 % (ref 0.0–0.2)

## 2024-10-25 LAB — PRO BRAIN NATRIURETIC PEPTIDE: Pro Brain Natriuretic Peptide: 4899 pg/mL — ABNORMAL HIGH

## 2024-10-25 MED ORDER — POTASSIUM CHLORIDE CRYS ER 20 MEQ PO TBCR: 20.0000 meq | EXTENDED_RELEASE_TABLET | Freq: Every day | ORAL | 3 refills | Status: AC

## 2024-10-25 MED ORDER — TORSEMIDE 20 MG PO TABS: ORAL_TABLET | ORAL | 1 refills | Status: DC

## 2024-10-25 NOTE — Patient Instructions (Addendum)
 Good to see you today!   INCREASE torsemide  to 80 mg ( 4 tablets) in the morning and 40 mg(2 tablets) in  the evening   START potassium 20 meq daily( 1 tablet)   Follow up with electrophysiology soon   Wear compression hose daily on in the morning and off at night  Labs done today, your results will be available in MyChart, we will contact you for abnormal readings.  Your physician recommends that you schedule a follow-up appointment as needed keep  appointment with general cardiology on 10/29/2024  If you have any questions or concerns before your next appointment please send us  a message through [facility] or call our office at [phone].    TO LEAVE A MESSAGE FOR THE NURSE SELECT OPTION 2, PLEASE LEAVE A MESSAGE INCLUDING: YOUR NAME DATE OF BIRTH CALL BACK NUMBER REASON FOR CALL**this is important as we prioritize the call backs  YOU WILL RECEIVE A CALL BACK THE SAME DAY AS LONG AS YOU CALL BEFORE 4:00 PM At the [facility], you and your health needs are our priority. As part of our continuing mission to provide you with exceptional heart care, we have created designated Provider Care Teams. These Care Teams include your primary Cardiologist (physician) and Advanced Practice Providers (APPs- Physician Assistants and Nurse Practitioners) who all work together to provide you with the care you need, when you need it.   You may see any of the following providers on your designated Care Team at your next follow up: Dr Toribio Fuel Dr Ezra Shuck Dr. Morene Brownie Greig Mosses, NP Caffie Shed, [place] [facility] [place], [place] Beckey Coe, NP Jordan Lee, NP Ellouise Class, NP Tinnie Redman, PharmD Jaun Bash, PharmD   Please be sure to bring in all your medications bottles to every appointment.    Thank you for choosing [facility]

## 2024-10-25 NOTE — Progress Notes (Signed)
"   ReDS Vest / Clip - 10/25/24 1014       ReDS Vest / Clip   Station Marker A    Ruler Value 31    ReDS Value Range High volume overload    ReDS Actual Value 45          "

## 2024-10-28 NOTE — Progress Notes (Signed)
 " Cardiology Office Note:    Date:  10/29/2024   ID:  [name], DOB March 30, 1933, MRN [id number]  PCP:  Freddrick Johns   [facility] Providers Cardiologist:  Oneil Parchment, MD Electrophysiologist:  Danelle Birmingham, MD     Referring MD: No ref. provider found   Chief complaint: Hospital follow-up     History of Present Illness:   [name] is a 89 y.o. female with a hx of permanent A-fib on Amio, SVT, complete heart block s/p PPM, known LBBB, chronic combined CHF, history of DVT on Eliquis , hypertension, CKD stage 3b, chronic anemia/thrombocytopenia and diabetes presenting the office today for follow-up of recent hospitalization secondary to CHF exacerbation.  Previously a patient of Dr. Dann.  EP study with ablation for SVT in 2013, did not eliminate SVT completely, subsequent bradycardia, placed on amiodarone .  2018 there was concern for stroke 2/2 stuttering and swallowing difficulties, unremarkable carotid Doppler study and echocardiogram showing moderate focal basal hypertrophy of the septum with LVEF 50-55%, no source of emboli.  Admitted in August 2020 for CHF requiring diuresis, echo demonstrating a small pericardial effusion and circular mass attached to the pacemaker lead in the right atrium. Per Dr. Ceola note, In the absence of fever or sign of sepsis I would not pursue further work-up at this point given the patient age and comorbidities.  I would consider TEE once any signs of infection. She recommended to restart oral Bumex  after a 1 day holiday from diuresis.  PPM was inserted at some point in the past in [place]  following an episode of complete heart block, followed by Dr. Birmingham with our EP group, pacer functioning normally in 2020.  Diagnosed with breast cancer, ultimately did not have surgery due to concerns about poor outcomes with anesthesia, treated with anastrozole .  Admitted December 2021 following a COVID-19 infection quiring IV antibiotics and diuresis.    Presented to the ED September 03, 2024 with complaints of shortness of breath following a an upper respiratory infection, with complaints of lower extremity edema and orthopnea.  BNP 383, Hgb 10.9, creatinine 1.38, GFR 37, troponin 55, EKG atrial flutter, 66 bpm, LBBB, CXR demonstrating cardiomegaly with pulmonary vascular congestion.  Echo 09/04/2024: LVEF 30-35%, moderately decreased LV function, no RWMA, moderate concentric LVH, G2 DD, RV SF mildly reduced, normal RV size, moderately elevated PASP, RV pressure 49 mmHg, LA severely dilated, small pericardial effusion, mild mitral regurgitation, normal MV, normal AV, RA pressure 15 mmHg.  Diuresed in the hospital.  Discharged on Coreg  and BiDil .  Not a candidate for SGLT2 inhibitors, ARB's, Entresto or Aldactone  due to potassium levels and CKD.  Discharged with follow-up to advanced heart failure clinic.  TOC clinic evaluation on 09/23/2024 demonstrated patient improving following discharge.  Question pacing involvement with decreased EF, referred to EP for setting adjustment versus evaluation for candidacy for upgrade to left bundle lead.  Recommended PYP testing and cardiology follow-up, with unremarkable SPEP and multiple myeloma labs.  The patient presents with her granddaughter to clinic, does not appear in acute distress at this time.  Patient states since leaving the hospital she has noticed worsening leg edema and shortness of breath progressively.  She does get shortness of breath with any form of positional transfer, denies dyspnea at rest.  Reports orthopnea requiring several pillows to sleep at night.  Feels as though I am breathing from my stomach, and reports her stomach feels swollen, decreased appetite, not to eat as much.  Feels like my  heart is just going to shut off.  She does get lightheadedness with positional change.  Denies chest pain, unsure of palpitations, denies changes to stool or urine.  States they have been checking her  weight at the facility and mentioned it had been increasing, they are uncertain of the exact weights.  Reports her legs are becoming heavier and more difficult to move, noting a darkening of the skin of the right lower leg.  They have questions regarding previous discussions around adjusting pacemaker settings to improve symptoms, and how they can go about making her more comfortable going forward.  ROS:   Please see the history of present illness.    All other systems reviewed and are negative.     Past Medical History:  Diagnosis Date   Abnormal echocardiogram    a. possible mass on echo 05/2019 on PPM, b. 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 Previously diastolic but patient AVS from outside hospital listed systolic CHF and cardiomyopathy, records pending.   CKD (chronic kidney disease), stage III (HCC)    Clotting disorder    CVA (cerebral vascular accident) (HCC)    residual right sided weakness and mild dysphagia   Diabetes mellitus (HCC)    Dizziness and giddiness    DVT (deep venous thrombosis) (HCC)    a. on anticoagulation for this.   Essential hypertension    LBBB (left bundle branch block)    Mitral regurgitation    a. Mod MR by echo 2014.   Muscular deconditioning    Obesity    Pacemaker    Pericardial effusion    SVT (supraventricular tachycardia)    a. In 2013 she had an EPS with ablation for SVT which did not eliminate the SVT completely. She also had bradycardia which limited medication. She was placed on amiodarone  by Dr. Waddell.   Thrombocytopenia 11/21/2011    Past Surgical History:  Procedure Laterality Date   BREAST BIOPSY Right 11/19/2018   positive   ESOPHAGOGASTRODUODENOSCOPY (EGD) WITH PROPOFOL  N/A 02/10/2022   Procedure:  ESOPHAGOGASTRODUODENOSCOPY (EGD) WITH PROPOFOL ;  Surgeon: Rollin Dover, MD;  Location: [facility] ENDOSCOPY;  Service: Gastroenterology;  Laterality: N/A;   EYE SURGERY     SUPRAVENTRICULAR TACHYCARDIA ABLATION N/A 10/11/2012   Procedure: SUPRAVENTRICULAR TACHYCARDIA ABLATION;  Surgeon: Danelle LELON Waddell, MD;  Location: [facility] CATH LAB;  Service: Cardiovascular;  Laterality: N/A;    Current Medications: Active Medications[1]   Allergies:   Aspirin, Penicillins, and Remeron  [mirtazapine ]   Social History   Socioeconomic History   Marital status: Widowed    Spouse name: Not on file   Number of children: 3   Years of education: Not on file   Highest education level: High school graduate  Occupational History   Occupation: retired  Tobacco Use   Smoking status: Never   Smokeless tobacco: Never  Vaping Use   Vaping status: Never Used  Substance and Sexual Activity   Alcohol use: No   Drug use: No   Sexual activity: Not Currently  Other Topics Concern   Not on file  Social History Narrative   Lives at home with grandchild, daughter comes and help    Social Drivers of Health   Tobacco Use:  Low Risk (10/29/2024)   Patient History    Smoking Tobacco Use: Never    Smokeless Tobacco Use: Never    Passive Exposure: Not on file  Financial Resource Strain: Low Risk (09/05/2024)   Overall Financial Resource Strain (CARDIA)    Difficulty of Paying Living Expenses: Not very hard  Food Insecurity: No Food Insecurity (09/04/2024)   Epic    Worried About Programme Researcher, Broadcasting/film/video in the Last Year: Never true    Ran Out of Food in the Last Year: Never true  Transportation Needs: No Transportation Needs (09/04/2024)   Epic    Lack of Transportation (Medical): No    Lack of Transportation (Non-Medical): No  Physical Activity: Not on file  Stress: Not on file  Social Connections: Moderately Integrated (09/04/2024)   Social Connection and Isolation Panel    Frequency of Communication with Friends and  Family: Three times a week    Frequency of Social Gatherings with Friends and Family: Three times a week    Attends Religious Services: 1 to 4 times per year    Active Member of Clubs or Organizations: No    Attends Banker Meetings: 1 to 4 times per year    Marital Status: Widowed  Depression (PHQ2-9): Not on file  Alcohol Screen: Low Risk (09/05/2024)   Alcohol Screen    Last Alcohol Screening Score (AUDIT): 0  Housing: Unknown (09/04/2024)   Epic    Unable to Pay for Housing in the Last Year: No    Number of Times Moved in the Last Year: 0    Homeless in the Last Year: Not on file  Recent Concern: Housing - High Risk (09/04/2024)   Epic    Unable to Pay for Housing in the Last Year: Yes    Number of Times Moved in the Last Year: Not on file    Homeless in the Last Year: No  Utilities: Not At Risk (09/04/2024)   Epic    Threatened with loss of utilities: No  Health Literacy: Not on file     Family History: The patient's family history includes Breast cancer in her maternal aunt; Cancer in her father; Heart attack in her brother; Hypertension in her brother, daughter, and sister; Stroke in her mother and sister.  EKGs/Labs/Other Studies Reviewed:    The following studies were reviewed today:       Recent Labs: 05/16/2024: TSH 1.730 09/04/2024: ALT 13 09/12/2024: Magnesium  2.6 09/23/2024: B Natriuretic Peptide 665.8 10/25/2024: BUN 33; Creatinine, Ser 1.98; Hemoglobin 9.3; Platelets 139; Potassium 4.7; Pro Brain Natriuretic Peptide 4,899.0; Sodium 144  Recent Lipid Panel    Component Value Date/Time   CHOL 195 08/15/2018 1236   TRIG 56 01/02/2019 1425   HDL 81 08/15/2018 1236   CHOLHDL 2.4 08/15/2018 1236   CHOLHDL 2.6 12/11/2008 0130   VLDL 13 12/11/2008 0130   LDLCALC 96 08/15/2018 1236     Risk Assessment/Calculations:    CHA2DS2-VASc Score = 8   This indicates a 10.8% annual risk of stroke. The patient's score is based upon: CHF History: 1 HTN  History: 1 Diabetes History: 1 Stroke History: 2 Vascular Disease History: 0 Age Score: 2 Gender Score: 1               Physical Exam:    VS:  BP 132/68   Pulse 60   Ht 5' 5 (1.651 m)   Wt 200 lb (90.7 kg)   SpO2 97%   BMI 33.28 kg/m  Wt Readings from Last 3 Encounters:  10/29/24 200 lb (90.7 kg)  10/25/24 204 lb (92.5 kg)  09/23/24 196 lb (88.9 kg)     GEN:  Well nourished, well developed in no acute distress HEENT: Normal NECK: + JVD; No carotid bruits CARDIAC:  S1-S2 normal, RRR, no murmurs, rubs, gallops RESPIRATORY:  No increased work of breathing at rest, able to complete full sentences, clear to auscultation without rales, wheezing or rhonchi  MUSCULOSKELETAL: Significant edema with pitting and nonpitting qualities bilateral lower extremities; No deformity  SKIN: Darkening of skin of right lower leg present, warm and dry NEUROLOGIC:  Alert and oriented x 3 PSYCHIATRIC:  Normal affect       Assessment & Plan HFrEF (heart failure with reduced ejection fraction) (HCC) 2024 LVEF 45-50% --> 08/2024 LVEF 30-35% with moderate LVH, G3 DD, mildly reduced RV, moderately elevated PASP BNP 383 --> 665 on 09/23/2024, advised to increase torsemide , follow-up proBNP 10/25/2024: 4899 Recent labs over the last month demonstrate elevating creatinine and potassium following torsemide  administration Appears volume overloaded with severe bilateral lower extremity edema and JVD, lungs are surprisingly clear auscultation, dizziness and shortness of breath reported with minimal exertion Weight reported today at 200 lb, unable to stand due to dizziness when attempting to get on the scale (d/c weight 192 lb on 11/20, as low as 183 lbon 11/15) Unable to determine NYHA functional class as she is wheelchair dependent, no resting dyspnea  Poor candidate for SGLT2 inhibitor, ARB, Entresto, and Aldactone  due to potassium levels and CKD Given volume status and apparent unresponsiveness  to increases in torsemide  dosing, discussed options with the DOD, Dr. Michele, patient, and family in regards to hospitalization for IV diuresis versus comfort measures to try to clarify goals of care. Patient and family wish to proceed to the emergency department for symptom management, where further discussion with nephrology and our EP team can be had to optimize patient volume and kidney function.  Patient and family aware that hospice consultation may be further discussed moving forward. Patient does appear stable for transport, will arrange for EMS services to take patient to hospital at this time.  Disposition: Proceed to emergency department           Medication Adjustments/Labs and Tests Ordered: Current medicines are reviewed at length with the patient today.  Concerns regarding medicines are outlined above.  No orders of the defined types were placed in this encounter.  No orders of the defined types were placed in this encounter.   Patient Instructions    Other Instructions Please proceed to the ER for fluid overload.           Signed, Nivan Melendrez E Charelle Petrakis, NP  10/29/2024 11:04 AM    [facility]     [1]  Current Meds  Medication Sig   acetaminophen  (TYLENOL ) 325 MG tablet Take 650 mg by mouth in the morning, at noon, and at bedtime.   amiodarone  (PACERONE ) 200 MG tablet Take one tablet by mouth daily Monday-Friday. Do NOT take Saturday and Sunday   carvedilol  (COREG ) 6.25 MG tablet Take 6.25 mg by mouth 2 (two) times daily with a meal.   Docusate Sodium  100 MG capsule Take 100 mg by mouth every morning.   ELIQUIS  2.5 MG TABS tablet TAKE 1 TABLET(2.5 MG) BY MOUTH TWICE DAILY   fluticasone-salmeterol (ADVAIR) 250-50 MCG/ACT AEPB Inhale 1 puff into the lungs 2 (two) times daily.   insulin  glargine (LANTUS ) 100 UNIT/ML injection Inject 7-12 Units into  the skin See admin instructions. Inject 12 units into the skin in the morning and 7 units in the evening    insulin  lispro (HUMALOG  KWIKPEN) 100 UNIT/ML KwikPen Before each meal 3 times a day, 140-199 - 2 units, 200-250 - 4 units, 251-299 - 6 units,  300-349 - 8 units,  350 or above 10 units. (Patient taking differently: Inject 2-10 Units into the skin in the morning and at bedtime. If 150-199= 2 units, 200-250= 4 units, 251-299= 6 units, 301-349= 8 units, 350-399= 10 units, recheck 1 hour >400 call [facility] provider)   ipratropium-albuterol  (DUONEB) 0.5-2.5 (3) MG/3ML SOLN Take 3 mLs by nebulization every 4 (four) hours as needed (for shortness of breath).   isosorbide -hydrALAZINE  (BIDIL ) 20-37.5 MG tablet Take 1 tablet by mouth 3 (three) times daily.   lidocaine (LIDODERM) 5 % Place 1 patch onto the skin daily. Apply to left posterior shoulder--Remove & Discard patch within 12 hours or as directed by MD   Menthol, Topical Analgesic, (BIOFREEZE COOL THE PAIN) 4 % GEL Apply 1 application  topically every 12 (twelve) hours as needed (for osteoarthritis).   mirabegron  ER (MYRBETRIQ ) 25 MG TB24 tablet Take 25 mg by mouth daily.   omeprazole  (PRILOSEC OTC) 20 MG tablet Take 20 mg by mouth every morning.   polyethylene glycol (MIRALAX  / GLYCOLAX ) 17 g packet Take 17 g by mouth daily as needed for mild constipation.   potassium chloride  SA (KLOR-CON  M) 20 MEQ tablet Take 1 tablet (20 mEq total) by mouth daily.   pregabalin  (LYRICA ) 25 MG capsule Take 1 capsule (25 mg total) by mouth 2 (two) times daily.   Propylene Glycol (SYSTANE COMPLETE OP) Place 1 drop into both eyes as needed (for itching and dry eye).   simethicone  (MYLICON) 80 MG chewable tablet Chew 1 tablet (80 mg total) by mouth 4 (four) times daily.   torsemide  (DEMADEX ) 20 MG tablet Take 4 tablets (80 mg total) by mouth in the morning AND 2 tablets (40 mg total) every evening. Take additional 20 mg daily as needed for weight gain of 3 lbs in 1 day or 5 lbs in 1 week.   "

## 2024-10-29 ENCOUNTER — Ambulatory Visit: Attending: Emergency Medicine | Admitting: Emergency Medicine

## 2024-10-29 ENCOUNTER — Ambulatory Visit: Admitting: Physician Assistant

## 2024-10-29 ENCOUNTER — Encounter (HOSPITAL_COMMUNITY): Payer: Self-pay

## 2024-10-29 ENCOUNTER — Encounter: Payer: Self-pay | Admitting: Emergency Medicine

## 2024-10-29 ENCOUNTER — Inpatient Hospital Stay (HOSPITAL_COMMUNITY)
Admission: EM | Admit: 2024-10-29 | Discharge: 2024-11-04 | DRG: 291 | Disposition: A | Discharge status: Skilled Nursing Facility | Source: Skilled Nursing Facility | Attending: Internal Medicine | Admitting: Internal Medicine

## 2024-10-29 ENCOUNTER — Emergency Department (HOSPITAL_COMMUNITY)

## 2024-10-29 ENCOUNTER — Other Ambulatory Visit: Payer: Self-pay

## 2024-10-29 VITALS — BP 132/68 | HR 60 | Ht 65.0 in | Wt 200.0 lb

## 2024-10-29 DIAGNOSIS — I5082 Biventricular heart failure: Secondary | ICD-10-CM | POA: Diagnosis present

## 2024-10-29 DIAGNOSIS — E1151 Type 2 diabetes mellitus with diabetic peripheral angiopathy without gangrene: Secondary | ICD-10-CM | POA: Diagnosis present

## 2024-10-29 DIAGNOSIS — D61818 Other pancytopenia: Secondary | ICD-10-CM | POA: Diagnosis present

## 2024-10-29 DIAGNOSIS — I129 Hypertensive chronic kidney disease with stage 1 through stage 4 chronic kidney disease, or unspecified chronic kidney disease: Secondary | ICD-10-CM

## 2024-10-29 DIAGNOSIS — Z803 Family history of malignant neoplasm of breast: Secondary | ICD-10-CM

## 2024-10-29 DIAGNOSIS — E66811 Obesity, class 1: Secondary | ICD-10-CM | POA: Diagnosis present

## 2024-10-29 DIAGNOSIS — Z8673 Personal history of transient ischemic attack (TIA), and cerebral infarction without residual deficits: Secondary | ICD-10-CM | POA: Diagnosis not present

## 2024-10-29 DIAGNOSIS — E7849 Other hyperlipidemia: Secondary | ICD-10-CM | POA: Diagnosis present

## 2024-10-29 DIAGNOSIS — I10 Essential (primary) hypertension: Secondary | ICD-10-CM | POA: Diagnosis present

## 2024-10-29 DIAGNOSIS — E1165 Type 2 diabetes mellitus with hyperglycemia: Secondary | ICD-10-CM | POA: Diagnosis present

## 2024-10-29 DIAGNOSIS — E785 Hyperlipidemia, unspecified: Secondary | ICD-10-CM | POA: Diagnosis not present

## 2024-10-29 DIAGNOSIS — I4821 Permanent atrial fibrillation: Secondary | ICD-10-CM | POA: Diagnosis present

## 2024-10-29 DIAGNOSIS — I5023 Acute on chronic systolic (congestive) heart failure: Secondary | ICD-10-CM | POA: Diagnosis not present

## 2024-10-29 DIAGNOSIS — R0902 Hypoxemia: Secondary | ICD-10-CM | POA: Diagnosis present

## 2024-10-29 DIAGNOSIS — E876 Hypokalemia: Secondary | ICD-10-CM | POA: Diagnosis not present

## 2024-10-29 DIAGNOSIS — Z8249 Family history of ischemic heart disease and other diseases of the circulatory system: Secondary | ICD-10-CM

## 2024-10-29 DIAGNOSIS — E870 Hyperosmolality and hypernatremia: Secondary | ICD-10-CM | POA: Diagnosis present

## 2024-10-29 DIAGNOSIS — E1169 Type 2 diabetes mellitus with other specified complication: Secondary | ICD-10-CM | POA: Diagnosis present

## 2024-10-29 DIAGNOSIS — I509 Heart failure, unspecified: Principal | ICD-10-CM

## 2024-10-29 DIAGNOSIS — D6869 Other thrombophilia: Secondary | ICD-10-CM | POA: Diagnosis present

## 2024-10-29 DIAGNOSIS — I130 Hypertensive heart and chronic kidney disease with heart failure and stage 1 through stage 4 chronic kidney disease, or unspecified chronic kidney disease: Secondary | ICD-10-CM | POA: Diagnosis present

## 2024-10-29 DIAGNOSIS — E1122 Type 2 diabetes mellitus with diabetic chronic kidney disease: Secondary | ICD-10-CM | POA: Diagnosis present

## 2024-10-29 DIAGNOSIS — Z823 Family history of stroke: Secondary | ICD-10-CM

## 2024-10-29 DIAGNOSIS — Z7401 Bed confinement status: Secondary | ICD-10-CM

## 2024-10-29 DIAGNOSIS — Z6833 Body mass index (BMI) 33.0-33.9, adult: Secondary | ICD-10-CM

## 2024-10-29 DIAGNOSIS — N184 Chronic kidney disease, stage 4 (severe): Secondary | ICD-10-CM | POA: Diagnosis not present

## 2024-10-29 DIAGNOSIS — Z888 Allergy status to other drugs, medicaments and biological substances status: Secondary | ICD-10-CM

## 2024-10-29 DIAGNOSIS — Z853 Personal history of malignant neoplasm of breast: Secondary | ICD-10-CM

## 2024-10-29 DIAGNOSIS — I428 Other cardiomyopathies: Secondary | ICD-10-CM | POA: Diagnosis present

## 2024-10-29 DIAGNOSIS — G4733 Obstructive sleep apnea (adult) (pediatric): Secondary | ICD-10-CM | POA: Diagnosis present

## 2024-10-29 DIAGNOSIS — Z880 Allergy status to penicillin: Secondary | ICD-10-CM

## 2024-10-29 DIAGNOSIS — I440 Atrioventricular block, first degree: Secondary | ICD-10-CM | POA: Diagnosis not present

## 2024-10-29 DIAGNOSIS — I495 Sick sinus syndrome: Secondary | ICD-10-CM | POA: Diagnosis present

## 2024-10-29 DIAGNOSIS — I5043 Acute on chronic combined systolic (congestive) and diastolic (congestive) heart failure: Secondary | ICD-10-CM | POA: Diagnosis present

## 2024-10-29 DIAGNOSIS — I340 Nonrheumatic mitral (valve) insufficiency: Secondary | ICD-10-CM | POA: Diagnosis present

## 2024-10-29 DIAGNOSIS — I4891 Unspecified atrial fibrillation: Secondary | ICD-10-CM

## 2024-10-29 DIAGNOSIS — I471 Supraventricular tachycardia, unspecified: Secondary | ICD-10-CM | POA: Diagnosis present

## 2024-10-29 DIAGNOSIS — N1832 Chronic kidney disease, stage 3b: Secondary | ICD-10-CM | POA: Diagnosis present

## 2024-10-29 DIAGNOSIS — Z170 Estrogen receptor positive status [ER+]: Secondary | ICD-10-CM | POA: Diagnosis not present

## 2024-10-29 DIAGNOSIS — Z66 Do not resuscitate: Secondary | ICD-10-CM | POA: Diagnosis present

## 2024-10-29 DIAGNOSIS — I4892 Unspecified atrial flutter: Secondary | ICD-10-CM | POA: Diagnosis present

## 2024-10-29 DIAGNOSIS — N183 Chronic kidney disease, stage 3 unspecified: Secondary | ICD-10-CM | POA: Diagnosis not present

## 2024-10-29 DIAGNOSIS — K219 Gastro-esophageal reflux disease without esophagitis: Secondary | ICD-10-CM | POA: Diagnosis present

## 2024-10-29 DIAGNOSIS — I5020 Unspecified systolic (congestive) heart failure: Secondary | ICD-10-CM | POA: Diagnosis not present

## 2024-10-29 DIAGNOSIS — R0602 Shortness of breath: Secondary | ICD-10-CM | POA: Diagnosis present

## 2024-10-29 DIAGNOSIS — I824Z1 Acute embolism and thrombosis of unspecified deep veins of right distal lower extremity: Secondary | ICD-10-CM | POA: Diagnosis not present

## 2024-10-29 DIAGNOSIS — I3139 Other pericardial effusion (noninflammatory): Secondary | ICD-10-CM | POA: Diagnosis present

## 2024-10-29 DIAGNOSIS — Z993 Dependence on wheelchair: Secondary | ICD-10-CM

## 2024-10-29 DIAGNOSIS — Z7951 Long term (current) use of inhaled steroids: Secondary | ICD-10-CM

## 2024-10-29 DIAGNOSIS — I5021 Acute systolic (congestive) heart failure: Secondary | ICD-10-CM | POA: Diagnosis not present

## 2024-10-29 DIAGNOSIS — K5900 Constipation, unspecified: Secondary | ICD-10-CM | POA: Diagnosis present

## 2024-10-29 DIAGNOSIS — C50311 Malignant neoplasm of lower-inner quadrant of right female breast: Secondary | ICD-10-CM | POA: Diagnosis not present

## 2024-10-29 DIAGNOSIS — I447 Left bundle-branch block, unspecified: Secondary | ICD-10-CM | POA: Diagnosis not present

## 2024-10-29 DIAGNOSIS — I480 Paroxysmal atrial fibrillation: Secondary | ICD-10-CM | POA: Diagnosis present

## 2024-10-29 DIAGNOSIS — I442 Atrioventricular block, complete: Secondary | ICD-10-CM | POA: Diagnosis present

## 2024-10-29 DIAGNOSIS — Z794 Long term (current) use of insulin: Secondary | ICD-10-CM

## 2024-10-29 DIAGNOSIS — Z950 Presence of cardiac pacemaker: Secondary | ICD-10-CM

## 2024-10-29 DIAGNOSIS — Z7901 Long term (current) use of anticoagulants: Secondary | ICD-10-CM

## 2024-10-29 DIAGNOSIS — Z886 Allergy status to analgesic agent status: Secondary | ICD-10-CM

## 2024-10-29 DIAGNOSIS — Z86718 Personal history of other venous thrombosis and embolism: Secondary | ICD-10-CM

## 2024-10-29 DIAGNOSIS — R54 Age-related physical debility: Secondary | ICD-10-CM | POA: Diagnosis present

## 2024-10-29 DIAGNOSIS — Z79899 Other long term (current) drug therapy: Secondary | ICD-10-CM

## 2024-10-29 DIAGNOSIS — I484 Atypical atrial flutter: Secondary | ICD-10-CM | POA: Diagnosis not present

## 2024-10-29 DIAGNOSIS — I82409 Acute embolism and thrombosis of unspecified deep veins of unspecified lower extremity: Secondary | ICD-10-CM | POA: Diagnosis present

## 2024-10-29 DIAGNOSIS — R55 Syncope and collapse: Secondary | ICD-10-CM

## 2024-10-29 LAB — TROPONIN T, HIGH SENSITIVITY
Troponin T High Sensitivity: 68 ng/L — ABNORMAL HIGH (ref 0–19)
Troponin T High Sensitivity: 66 ng/L — ABNORMAL HIGH (ref 0–19)

## 2024-10-29 LAB — BASIC METABOLIC PANEL WITH GFR
Anion gap: 12 (ref 5–15)
BUN: 32 mg/dL — ABNORMAL HIGH (ref 8–23)
CO2: 30 mmol/L (ref 22–32)
Calcium: 9.2 mg/dL (ref 8.9–10.3)
Chloride: 104 mmol/L (ref 98–111)
Creatinine, Ser: 1.68 mg/dL — ABNORMAL HIGH (ref 0.44–1.00)
GFR, Estimated: 29 mL/min — ABNORMAL LOW
Glucose, Bld: 130 mg/dL — ABNORMAL HIGH (ref 70–99)
Potassium: 3.7 mmol/L (ref 3.5–5.1)
Sodium: 146 mmol/L — ABNORMAL HIGH (ref 135–145)

## 2024-10-29 LAB — CBC
HCT: 32.6 % — ABNORMAL LOW (ref 36.0–46.0)
Hemoglobin: 10 g/dL — ABNORMAL LOW (ref 12.0–15.0)
MCH: 28.7 pg (ref 26.0–34.0)
MCHC: 30.7 g/dL (ref 30.0–36.0)
MCV: 93.7 fL (ref 80.0–100.0)
Platelets: 143 K/uL — ABNORMAL LOW (ref 150–400)
RBC: 3.48 MIL/uL — ABNORMAL LOW (ref 3.87–5.11)
RDW: 16 % — ABNORMAL HIGH (ref 11.5–15.5)
WBC: 3.7 K/uL — ABNORMAL LOW (ref 4.0–10.5)
nRBC: 0 % (ref 0.0–0.2)

## 2024-10-29 LAB — PRO BRAIN NATRIURETIC PEPTIDE: Pro Brain Natriuretic Peptide: 7389 pg/mL — ABNORMAL HIGH

## 2024-10-29 LAB — CBG MONITORING, ED: Glucose-Capillary: 229 mg/dL — ABNORMAL HIGH (ref 70–99)

## 2024-10-29 MED ORDER — ACETAMINOPHEN 325 MG PO TABS: 650.0000 mg | ORAL_TABLET | Freq: Four times a day (QID) | ORAL | Status: DC | PRN

## 2024-10-29 MED ORDER — ONDANSETRON HCL 4 MG PO TABS: 4.0000 mg | ORAL_TABLET | Freq: Four times a day (QID) | ORAL | Status: DC | PRN

## 2024-10-29 MED ORDER — ACETAMINOPHEN 650 MG RE SUPP: 650.0000 mg | Freq: Four times a day (QID) | RECTAL | Status: DC | PRN

## 2024-10-29 MED ORDER — INSULIN ASPART 100 UNIT/ML IJ SOLN
0.0000 [IU] | Freq: Three times a day (TID) | INTRAMUSCULAR | Status: DC
  Administered 2024-10-30: 1 [IU] via SUBCUTANEOUS
  Administered 2024-10-30 (×2): 3 [IU] via SUBCUTANEOUS
  Administered 2024-10-31 (×2): 2 [IU] via SUBCUTANEOUS
  Administered 2024-10-31: 5 [IU] via SUBCUTANEOUS
  Administered 2024-11-01: 2 [IU] via SUBCUTANEOUS
  Administered 2024-11-01 – 2024-11-02 (×3): 3 [IU] via SUBCUTANEOUS
  Administered 2024-11-02: 2 [IU] via SUBCUTANEOUS
  Administered 2024-11-02: 5 [IU] via SUBCUTANEOUS
  Administered 2024-11-03 (×2): 2 [IU] via SUBCUTANEOUS
  Administered 2024-11-03 – 2024-11-04 (×2): 3 [IU] via SUBCUTANEOUS
  Administered 2024-11-04: 2 [IU] via SUBCUTANEOUS
  Filled 2024-10-29: qty 1
  Filled 2024-10-29 (×3): qty 3
  Filled 2024-10-29 (×2): qty 2
  Filled 2024-10-29: qty 3
  Filled 2024-10-29 (×4): qty 2
  Filled 2024-10-29 (×2): qty 3
  Filled 2024-10-29: qty 2
  Filled 2024-10-29: qty 3

## 2024-10-29 MED ORDER — FUROSEMIDE 10 MG/ML IJ SOLN
40.0000 mg | Freq: Two times a day (BID) | INTRAMUSCULAR | Status: DC
  Administered 2024-10-30: 40 mg via INTRAVENOUS
  Filled 2024-10-29: qty 4

## 2024-10-29 MED ORDER — FUROSEMIDE 10 MG/ML IJ SOLN
40.0000 mg | Freq: Once | INTRAMUSCULAR | Status: AC
  Administered 2024-10-29: 40 mg via INTRAVENOUS
  Filled 2024-10-29: qty 4

## 2024-10-29 MED ORDER — INSULIN GLARGINE-YFGN 100 UNIT/ML ~~LOC~~ SOLN
6.0000 [IU] | Freq: Every day | SUBCUTANEOUS | Status: DC
  Administered 2024-10-29 – 2024-11-03 (×6): 6 [IU] via SUBCUTANEOUS
  Filled 2024-10-29 (×7): qty 0.06

## 2024-10-29 MED ORDER — ONDANSETRON HCL 4 MG/2ML IJ SOLN: 4.0000 mg | Freq: Four times a day (QID) | INTRAMUSCULAR | Status: DC | PRN

## 2024-10-29 MED ORDER — INSULIN ASPART 100 UNIT/ML IJ SOLN
0.0000 [IU] | Freq: Every day | INTRAMUSCULAR | Status: DC
  Administered 2024-10-29 – 2024-11-03 (×4): 2 [IU] via SUBCUTANEOUS
  Filled 2024-10-29 (×5): qty 2

## 2024-10-29 MED ORDER — NITROGLYCERIN 2 % TD OINT
1.0000 [in_us] | TOPICAL_OINTMENT | Freq: Four times a day (QID) | TRANSDERMAL | Status: DC
  Administered 2024-10-29: 1 [in_us] via TOPICAL
  Filled 2024-10-29 (×2): qty 1

## 2024-10-29 NOTE — ED Provider Notes (Signed)
 " [place] EMERGENCY DEPARTMENT AT [facility] Provider Note   CSN: [id number] Arrival date & time: 10/29/24  1210     Patient presents with: Shortness of Breath   [name] is a 89 y.o. female.    Shortness of Breath    Patient has a history of thrombocytopenia DVT hypertension diabetes chronic kidney disease, stroke, congestive heart failure.  Patient presents ED with complaints of increasing shortness of breath.  Patient states she has noted some increasing leg swelling.  She has been gaining weight.  Patient went to her cardiologist office today who felt like she needed to be admitted to the hospital for fluid overload.  Patient denies any fevers.  No abdominal pain.  No chest pain.  Prior to Admission medications  Medication Sig Start Date End Date Taking? Authorizing Provider  acetaminophen  (TYLENOL ) 325 MG tablet Take 650 mg by mouth in the morning, at noon, and at bedtime.    [provider]  amiodarone  (PACERONE ) 200 MG tablet Take one tablet by mouth daily Monday-Friday. Do NOT take Saturday and Sunday 06/21/22   Lesia Ozell Barter, PA-C  carvedilol  (COREG ) 6.25 MG tablet Take 6.25 mg by mouth 2 (two) times daily with a meal.    [provider]  Docusate Sodium  100 MG capsule Take 100 mg by mouth every morning.    [provider]  ELIQUIS  2.5 MG TABS tablet TAKE 1 TABLET(2.5 MG) BY MOUTH TWICE DAILY 07/13/20   Dunn, Dayna N, PA-C  fluticasone-salmeterol (ADVAIR) 250-50 MCG/ACT AEPB Inhale 1 puff into the lungs 2 (two) times daily. 08/16/24   [provider]  insulin  glargine (LANTUS ) 100 UNIT/ML injection Inject 7-12 Units into the skin See admin instructions. Inject 12 units into the skin in the morning and 7 units in the evening    [provider]  insulin  lispro (HUMALOG  KWIKPEN) 100 UNIT/ML KwikPen Before each meal 3 times a day, 140-199 - 2 units, 200-250 - 4 units, 251-299 - 6 units,  300-349 - 8 units,  350 or  above 10 units. Patient taking differently: Inject 2-10 Units into the skin in the morning and at bedtime. If 150-199= 2 units, 200-250= 4 units, 251-299= 6 units, 301-349= 8 units, 350-399= 10 units, recheck 1 hour >400 call [employer] provider 06/05/23   Singh, Prashant K, MD  ipratropium-albuterol  (DUONEB) 0.5-2.5 (3) MG/3ML SOLN Take 3 mLs by nebulization every 4 (four) hours as needed (for shortness of breath).    [provider]  isosorbide -hydrALAZINE  (BIDIL ) 20-37.5 MG tablet Take 1 tablet by mouth 3 (three) times daily. 09/12/24   Patel, Pranav M, MD  lidocaine (LIDODERM) 5 % Place 1 patch onto the skin daily. Apply to left posterior shoulder--Remove & Discard patch within 12 hours or as directed by MD    [provider]  Menthol, Topical Analgesic, (BIOFREEZE COOL THE PAIN) 4 % GEL Apply 1 application  topically every 12 (twelve) hours as needed (for osteoarthritis).    [provider]  mirabegron  ER (MYRBETRIQ ) 25 MG TB24 tablet Take 25 mg by mouth daily.    [provider]  omeprazole  (PRILOSEC OTC) 20 MG tablet Take 20 mg by mouth every morning.    [provider]  polyethylene glycol (MIRALAX  / GLYCOLAX ) 17 g packet Take 17 g by mouth daily as needed for mild constipation.    [provider]  potassium chloride  SA (KLOR-CON  M) 20 MEQ tablet Take 1 tablet (20 mEq total) by mouth daily.  10/25/24   Milford, Harlene HERO, FNP  pregabalin  (LYRICA ) 25 MG capsule Take 1 capsule (25 mg total) by mouth 2 (two) times daily. 09/12/24   Tobie Yetta HERO, MD  Propylene Glycol (SYSTANE COMPLETE OP) Place 1 drop into both eyes as needed (for itching and dry eye).    [provider]  simethicone  (MYLICON) 80 MG chewable tablet Chew 1 tablet (80 mg total) by mouth 4 (four) times daily. 09/12/24   Tobie Yetta HERO, MD  torsemide  (DEMADEX ) 20 MG tablet Take 4 tablets (80 mg total) by mouth in the morning AND 2 tablets (40 mg total) every evening. Take  additional 20 mg daily as needed for weight gain of 3 lbs in 1 day or 5 lbs in 1 week. 10/25/24   Glena Harlene HERO, FNP    Allergies: Aspirin, Penicillins, and Remeron  [mirtazapine ]    Review of Systems  Respiratory:  Positive for shortness of breath.     Updated Vital Signs BP (!) 152/90   Pulse (!) 102   Temp 98 F (36.7 C) (Oral)   Resp (!) 33   Ht 1.651 m (5' 5)   Wt 91.2 kg   SpO2 98%   BMI 33.45 kg/m   Physical Exam Vitals and nursing note reviewed.  Constitutional:      General: She is not in acute distress.    Appearance: She is well-developed. She is obese.  HENT:     Head: Normocephalic and atraumatic.     Right Ear: External ear normal.     Left Ear: External ear normal.  Eyes:     General: No scleral icterus.       Right eye: No discharge.        Left eye: No discharge.     Conjunctiva/sclera: Conjunctivae normal.  Neck:     Trachea: No tracheal deviation.  Cardiovascular:     Rate and Rhythm: Normal rate and regular rhythm.  Pulmonary:     Effort: Pulmonary effort is normal. No respiratory distress.     Breath sounds: Normal breath sounds. No stridor. No wheezing or rales.  Abdominal:     General: Bowel sounds are normal. There is no distension.     Palpations: Abdomen is soft.     Tenderness: There is no abdominal tenderness. There is no guarding or rebound.  Musculoskeletal:        General: No tenderness or deformity.     Cervical back: Neck supple.     Right lower leg: Edema present.     Left lower leg: Edema present.  Skin:    General: Skin is warm and dry.     Findings: No rash.  Neurological:     General: No focal deficit present.     Mental Status: She is alert.     Cranial Nerves: No cranial nerve deficit, dysarthria or facial asymmetry.     Sensory: No sensory deficit.     Motor: No abnormal muscle tone or seizure activity.     Coordination: Coordination normal.  Psychiatric:        Mood and Affect: Mood normal.     (all labs  ordered are listed, but only abnormal results are displayed) Labs Reviewed  BASIC METABOLIC PANEL WITH GFR - Abnormal; Notable for the following components:      Result Value   Sodium 146 (*)    Glucose, Bld 130 (*)    BUN 32 (*)    Creatinine, Ser 1.68 (*)  GFR, Estimated 29 (*)    All other components within normal limits  CBC - Abnormal; Notable for the following components:   WBC 3.7 (*)    RBC 3.48 (*)    Hemoglobin 10.0 (*)    HCT 32.6 (*)    RDW 16.0 (*)    Platelets 143 (*)    All other components within normal limits  PRO BRAIN NATRIURETIC PEPTIDE - Abnormal; Notable for the following components:   Pro Brain Natriuretic Peptide 7,389.0 (*)    All other components within normal limits  TROPONIN T, HIGH SENSITIVITY - Abnormal; Notable for the following components:   Troponin T High Sensitivity 68 (*)    All other components within normal limits  TROPONIN T, HIGH SENSITIVITY - Abnormal; Notable for the following components:   Troponin T High Sensitivity 66 (*)    All other components within normal limits    EKG: EKG Interpretation Date/Time:  Tuesday October 29 2024 12:55:47 EST Ventricular Rate:  64 PR Interval:    QRS Duration:  152 QT Interval:  466 QTC Calculation: 480 R Axis:   -35  Text Interpretation: Atrial fibrillation with frequent ventricular-paced complexes Left axis deviation Left bundle branch block Abnormal ECG When compared with ECG of 04-Sep-2024 16:58, No significant change since last tracing Confirmed by Randol Simmonds [phone]) on 10/29/2024 6:10:24 PM  Radiology: [facility] Chest 2 View Result Date: 10/29/2024 CLINICAL DATA:  Shortness of breath. EXAM: CHEST - 2 VIEW COMPARISON:  September 03, 2024 FINDINGS: There is stable dual lead AICD positioning. The cardiac silhouette is enlarged and unchanged in size. Mild perihilar prominence of the pulmonary vasculature is seen. Mild to moderate severity diffusely increased interstitial lung markings are present. Mild  linear scarring and/or atelectasis is noted within the mid left lung and bilateral lung bases. No pleural effusion or pneumothorax is identified. There is scoliosis of the thoracic spine with multilevel degenerative changes. IMPRESSION: 1. Cardiomegaly with mild to moderate severity interstitial edema. 2. Mild mid left lung and bibasilar linear scarring and/or atelectasis. Electronically Signed   By: Suzen Dials M.D.   On: 10/29/2024 13:34     Procedures   Medications Ordered in the ED  furosemide  (LASIX ) injection 40 mg (has no administration in time range)  nitroGLYCERIN  (NITROGLYN) 2 % ointment 1 inch (has no administration in time range)    Clinical Course as of 10/29/24 1903  Tue Oct 29, 2024  1851 DG Chest 2 View C/w chf [JK]  1851 Basic metabolic panel(!) Cr similar to previous [JK]  1851 CBC(!) Anemia stable [JK]  1851 Pro Brain natriuretic peptide(!) Increased  [JK]  1852 Troponin T, High Sensitivity(!) Delta unchanged [JK]    Clinical Course User Index [JK] Randol Simmonds, MD                                 Medical Decision Making Amount and/or Complexity of Data Reviewed Labs: ordered. Decision-making details documented in ED Course. Radiology: ordered. Decision-making details documented in ED Course.  Risk Prescription drug management.   Patient presented to the ED for evaluation of shortness of breath.  Patient does have a history of CHF.  She has noticed increased weight gain.  Patient has also noticed some leg swelling.  Patient's x-ray does suggest CHF exacerbation.  She does have an elevated proBNP.  Patient is not have any chest pain.  Doubt acute ischemia at this time.  I have  ordered diuretics and topical nitrates.  I will consult the medical service for admission.   Case discussed with Dr     Final diagnoses:  Acute on chronic congestive heart failure, unspecified heart failure type [facility])    ED Discharge Orders     None           Randol Simmonds, MD 10/29/24 2321  "

## 2024-10-29 NOTE — ED Triage Notes (Signed)
 Pt to er via ems, per ems pt is from [facility] nursing home, states that pt went to her cardiologist and the granddaughter wanted her taken to the er, states that she is here for sob and fluid retention. Pt states that she is more sob than normal

## 2024-10-29 NOTE — ED Notes (Addendum)
 Pt given ice chips, she is aware no more ice chips once Bipap is placed. Dr. Delphine OK with this.

## 2024-10-29 NOTE — ED Notes (Signed)
 CCMD called for continuous cardiac monitoring.

## 2024-10-29 NOTE — H&P (Addendum)
 " History and Physical    Patient: [name] FMW:[id number] DOB: 1933/09/13 DOA: 10/29/2024 DOS: the patient was seen and examined on 10/29/2024 PCP: Pcp, No  Patient coming from: SNF  Chief Complaint:  Chief Complaint  Patient presents with   Shortness of Breath   HPI: TANAYA DUNIGAN is a 89 y.o. female with medical history significant of permanent A-fib on Eliquis , SVT, HFrEF, OSA on 2 L supplemental oxygen at bedtime (BiPAP intolerant per pulmonology note), breast cancer status post antiestrogen therapy with anastrozole  11/28/2018-02/21/2022, CKD stage IIIb, CVA, type 2 diabetes, hypertension, history of DVT, GERD, CHB s/p PPM who presents to the emergency department via EMS from [facility] due to 3-day onset of increasing shortness of breath and about 4-day onset of leg swelling.  Shortness of breath worsens with exertion.  She went to her cardiologist office today and after being evaluated, it was felt that patient needed to be hospitalized for fluid overload.  She denies chest pain, fever, abdominal pain.  ED course In the emergency department, BP was 183/75, other vital signs were within normal range.  Workup in the ED showed normocytic anemia, platelets 143.  BMP was significant for sodium of 146, blood glucose 130, BUN/creatinine 32/1.68 (creatinine improved from 1.8-2.0 baseline), troponin 68 > 66, proBNP 7,389. Chest x-ray showed cardiomegaly with mild to moderate severity interstitial edema Patient was treated with IV Lasix  40 mg x 1.  TRH was asked to admit patient    Review of Systems: As mentioned in the history of present illness. All other systems reviewed and are negative. Past Medical History:  Diagnosis Date   Abnormal echocardiogram    a. possible mass on echo 05/2019 on PPM, b. no evidence of mass / atrial lead vegetation on follow up echo 06/2019   Anemia    Arthritis    Breast cancer (HCC) 10/2018   Invasive ductal carcinoma   Cancer (HCC)    Cataract     Chronic combined systolic and diastolic CHF (congestive heart failure) (HCC)    a. Previously diastolic but patient AVS from outside hospital listed systolic CHF and cardiomyopathy, records pending.   CKD (chronic kidney disease), stage III (HCC)    Clotting disorder    CVA (cerebral vascular accident) (HCC)    residual right sided weakness and mild dysphagia   Diabetes mellitus (HCC)    Dizziness and giddiness    DVT (deep venous thrombosis) (HCC)    a. on anticoagulation for this.   Essential hypertension    LBBB (left bundle branch block)    Mitral regurgitation    a. Mod MR by echo 2014.   Muscular deconditioning    Obesity    Pacemaker    Pericardial effusion    SVT (supraventricular tachycardia)    a. In 2013 she had an EPS with ablation for SVT which did not eliminate the SVT completely. She also had bradycardia which limited medication. She was placed on amiodarone  by Dr. Waddell.   Thrombocytopenia 11/21/2011   Past Surgical History:  Procedure Laterality Date   BREAST BIOPSY Right 11/19/2018   positive   ESOPHAGOGASTRODUODENOSCOPY (EGD) WITH PROPOFOL  N/A 02/10/2022   Procedure: ESOPHAGOGASTRODUODENOSCOPY (EGD) WITH PROPOFOL ;  Surgeon: Rollin Dover, MD;  Location[facility] ENDOSCOPY;  Service: Gastroenterology;  Laterality: N/A;   EYE SURGERY     SUPRAVENTRICULAR TACHYCARDIA ABLATION N/A 10/11/2012   Procedure: SUPRAVENTRICULAR TACHYCARDIA ABLATION;  Surgeon: Danelle LELON Waddell, MD;  Location: [facility] CATH LAB;  Service: Cardiovascular;  Laterality: N/A;   Social History:  reports that she has never smoked. She has never used smokeless tobacco. She reports that she does not drink alcohol and does not use drugs.  Allergies[1]  Family History  Problem Relation Age of Onset   Stroke Mother    Cancer Father        prostate   Hypertension Daughter    Heart attack Brother        x2   Hypertension Brother    Stroke Sister    Hypertension Sister    Breast cancer Maternal Aunt      Prior to Admission medications  Medication Sig Start Date End Date Taking? Authorizing Provider  acetaminophen  (TYLENOL ) 325 MG tablet Take 650 mg by mouth in the morning, at noon, and at bedtime.    [provider]  amiodarone  (PACERONE ) 200 MG tablet Take one tablet by mouth daily Monday-Friday. Do NOT take Saturday and Sunday 06/21/22   Lesia Ozell Barter, PA-C  carvedilol  (COREG ) 6.25 MG tablet Take 6.25 mg by mouth 2 (two) times daily with a meal.    [provider]  Docusate Sodium  100 MG capsule Take 100 mg by mouth every morning.    [provider]  ELIQUIS  2.5 MG TABS tablet TAKE 1 TABLET(2.5 MG) BY MOUTH TWICE DAILY 07/13/20   Dunn, Dayna N, PA-C  fluticasone-salmeterol (ADVAIR) 250-50 MCG/ACT AEPB Inhale 1 puff into the lungs 2 (two) times daily. 08/16/24   [provider]  insulin  glargine (LANTUS ) 100 UNIT/ML injection Inject 7-12 Units into the skin See admin instructions. Inject 12 units into the skin in the morning and 7 units in the evening    [provider]  insulin  lispro (HUMALOG  KWIKPEN) 100 UNIT/ML KwikPen Before each meal 3 times a day, 140-199 - 2 units, 200-250 - 4 units, 251-299 - 6 units,  300-349 - 8 units,  350 or above 10 units. Patient taking differently: Inject 2-10 Units into the skin in the morning and at bedtime. If 150-199= 2 units, 200-250= 4 units, 251-299= 6 units, 301-349= 8 units, 350-399= 10 units, recheck 1 hour >400 call [employer] provider 06/05/23   Singh, Prashant K, MD  ipratropium-albuterol  (DUONEB) 0.5-2.5 (3) MG/3ML SOLN Take 3 mLs by nebulization every 4 (four) hours as needed (for shortness of breath).    [provider]  isosorbide -hydrALAZINE  (BIDIL ) 20-37.5 MG tablet Take 1 tablet by mouth 3 (three) times daily. 09/12/24   Patel, Pranav M, MD  lidocaine (LIDODERM) 5 % Place 1 patch onto the skin daily. Apply to left posterior shoulder--Remove & Discard patch within 12 hours or as directed by  MD    [provider]  Menthol, Topical Analgesic, (BIOFREEZE COOL THE PAIN) 4 % GEL Apply 1 application  topically every 12 (twelve) hours as needed (for osteoarthritis).    [provider]  mirabegron  ER (MYRBETRIQ ) 25 MG TB24 tablet Take 25 mg by mouth daily.    [provider]  omeprazole  (PRILOSEC OTC) 20 MG tablet Take 20 mg by mouth every morning.    [provider]  polyethylene glycol (MIRALAX  / GLYCOLAX ) 17 g packet Take 17 g by mouth daily as needed for mild constipation.    [provider]  potassium chloride  SA (KLOR-CON  M) 20 MEQ tablet Take 1 tablet (20 mEq total) by mouth daily. 10/25/24   Milford, Harlene HERO, FNP  pregabalin  (LYRICA ) 25 MG capsule Take 1 capsule (25 mg total) by mouth 2 (two) times daily. 09/12/24  Tobie Yetta HERO, MD  Propylene Glycol (SYSTANE COMPLETE OP) Place 1 drop into both eyes as needed (for itching and dry eye).    [provider]  simethicone  (MYLICON) 80 MG chewable tablet Chew 1 tablet (80 mg total) by mouth 4 (four) times daily. 09/12/24   Tobie Yetta HERO, MD  torsemide  (DEMADEX ) 20 MG tablet Take 4 tablets (80 mg total) by mouth in the morning AND 2 tablets (40 mg total) every evening. Take additional 20 mg daily as needed for weight gain of 3 lbs in 1 day or 5 lbs in 1 week. 10/25/24   Glena Harlene HERO, FNP    Physical Exam: Vitals:   10/29/24 1830 10/29/24 1833 10/29/24 1838 10/29/24 1846  BP: (!) 152/90     Pulse:  76  (!) 102  Resp:  (!) 29  (!) 33  Temp:   98 F (36.7 C)   TempSrc:   Oral   SpO2:  (!) 89%  98%  Weight:      Height:       General: Elderly female. Awake and alert and oriented x3. Not in any acute distress.  HEENT: NCAT.  PERRLA. EOMI. Sclerae anicteric.  Moist mucosal membranes. Neck: Neck supple without lymphadenopathy. No carotid bruits. No masses palpated.  Cardiovascular: Regular rate with normal S1-S2 sounds. No murmurs, rubs or gallops auscultated. No JVD.   Respiratory: Diffuse Rales in lower lobes..  Clear breath sounds.  No accessory muscle use. Abdomen: Soft, nontender, nondistended. Active bowel sounds. No masses or hepatosplenomegaly  Skin: No rashes, lesions, or ulcerations.  Dry, warm to touch. Musculoskeletal: Noted insertion site of AICD.  +2 edema in the lower extremities bilaterally.  2+ dorsalis pedis and radial pulses. Good ROM.  No contractures  Psychiatric: Intact judgment and insight.  Mood appropriate to current condition. Neurologic: No focal neurological deficits. Strength is 5/5 x 4.  CN II - XII grossly intact.  Assessment and Plan: Acute on chronic combined systolic and diastolic CHF proBNP 7389.  Chest x-ray indicative of interstitial edema She presents with worsening dyspnea on exertion and increased leg swelling Continue total input/output, daily weights and fluid restriction IV Lasix  40 mg x 1 was given.  Continue IV Lasix  40 mg twice daily Continue heart healthy diet  Echocardiogram on 09/04/2024 showed LVEF of 30 to 35%.  No RWMA.  Moderate concentric LVH.  G3 DD.  RV systolic function mildly reduced.  Elevated PASP.   Cardiology will be consulted  Type 2 diabetes mellitus with hyperglycemia hemoglobin A1c 9.8 (09/04/2024) Continue Semglee  6 units nightly and adjust dose accordingly Continue ISS and hypoglycemia protocol   Paroxysmal A-fib EKG personally reviewed showed atrial fibrillation with rate controlled and LBBB Continue amiodarone  and Eliquis .     History of CVA Continue Eliquis    Obesity Class 1 (BMI 33.45) Diet and lifestyle modification  History of CHB, PPM Last pacemaker interrogation was reported on 08/07/2024 with a device programming being appropriate  Constipation Continue on docusate  GERD Continue PPI    Advance Care Planning: DNR  Consults: Cardiology  Family Communication: None at bedside  Severity of Illness: The appropriate patient status for this patient is INPATIENT.  Inpatient status is judged to be reasonable and necessary in order to provide the required intensity of service to ensure the patient's safety. The patient's presenting symptoms, physical exam findings, and initial radiographic and laboratory data in the context of their chronic comorbidities is felt to place them at high risk for further  clinical deterioration. Furthermore, it is not anticipated that the patient will be medically stable for discharge from the hospital within 2 midnights of admission.   * I certify that at the point of admission it is my clinical judgment that the patient will require inpatient hospital care spanning beyond 2 midnights from the point of admission due to high intensity of service, high risk for further deterioration and high frequency of surveillance required.*  Author: Gillian Meeuwsen, DO 10/29/2024 7:17 PM  For on call review [email or web address].      [1]  Allergies Allergen Reactions   Aspirin Itching   Penicillins Itching and Rash    DID THE REACTION INVOLVE: Swelling of the face/tongue/throat, SOB, or low BP? Yes Sudden or severe rash/hives, skin peeling, or the inside of the mouth or nose? No Did it require medical treatment? Yes When did it last happen? More than 10 years ago If all above answers are NO, may proceed with cephalosporin use.  Tolerated full course of Unasyn  in 2021.   Remeron  [Mirtazapine ] Other (See Comments)    Unknown reaction   "

## 2024-10-29 NOTE — ED Notes (Signed)
 Patient CBG was 229 at 2130

## 2024-10-29 NOTE — ED Notes (Addendum)
 Attending hospitalist to bedside

## 2024-10-29 NOTE — ED Provider Triage Note (Signed)
 Emergency Medicine Provider Triage Evaluation Note  [name] , a 89 y.o. female  was evaluated in triage.  Pt complains of worsening edema shortness of breath and fatigue.  Review of Systems  Positive: Peripheral edema, shortness of breath, fatigue, some chest tightness and discomfort Negative: No nausea, vomiting, constipation, diarrhea, or urinary changes.  No fevers or chills.  Physical Exam  BP (!) 183/75 (BP Location: Left Arm)   Pulse 66   Temp 99.5 F (37.5 C) (Oral)   Ht 5' 5 (1.651 m)   Wt 91.2 kg   SpO2 98%   BMI 33.45 kg/m  Gen:   Awake, no distress   Resp:  Normal effort, diffuse rales and rhonchi in all lung fields MSK:   Moves extremities without difficulty , significant edema in both legs that she reports is worsening Other:    Medical Decision Making  Medically screening exam initiated at 1:28 PM.  Appropriate orders placed.  [name] was informed that the remainder of the evaluation will be completed by another provider, this initial triage assessment does not replace that evaluation, and the importance of remaining in the ED until their evaluation is complete.  [name] is a 89 y.o. female with a past medical history significant for CKD, CHF, hypertension, diabetes, previous SVT and previous complete heart block with a pacemaker, previous breast cancer, and previous DVT and atrial fibrillation on Eliquis  therapy who further management with IV diuresis and admission to manage worsening fluid overload and shortness of breath.  According to patient and documentation from her cardiologist office visit today, she has been gaining weight and having worsened peripheral edema and worsening kidney functions despite outpatient management.  She is getting more short of breath and is getting more edematous and cardiology sent her in to be admitted for IV diuresis and management.  She does report some intermittent chest discomfort but not constantly.  She denies any  fevers, chills, congestion, or cough.  She denies nausea, vomiting, constipation, diarrhea, or urinary changes.  She does report more peripheral edema than before.  On exam, lungs have rales diffusely with some rhonchi.  Chest is nontender.  I do hear slight murmur.  Abdomen nontender.  Legs are quite edematous.  Patient otherwise resting comfortably on nasal cannula oxygen.  Will get screening labs x-ray and workup started however anticipate she will likely need IV diuresis and admission as per her ideations.  Anticipate disposition and further management after she is seen by provider in exam room.      [name], Stephanie PARAS, MD 10/29/24 [phone]

## 2024-10-29 NOTE — ED Notes (Signed)
 Notified Dr. Adefeso DO regarding patient's increased work of breathing and respiratory rates in the 30s to 40s. [place] upped to 5L

## 2024-10-29 NOTE — Progress Notes (Signed)
 Progress Note  RN called due to patient having increased work of breathing with need to increase O2 sat via [place] to 5 LPM from 2 LPM.  At bedside, patient with labored breathing and Rales on auscultation.   BP (!) 175/93   Pulse 88   Temp 98 F (36.7 C) (Oral)   Resp (!) 32   Ht 5' 5 (1.651 m)   Wt 91.2 kg   SpO2 99%   BMI 33.45 kg/m   Assessment and plan Acute on chronic combined systolic and diastolic CHF Decision was made to place patient on BiPAP. Patient will be changed to progressive unit for closer monitoring Continue management as described in H&P Cardiology was already consulted and plan on seeing patient.  We shall await further recommendations.  Total time:  32 minutes This includes time reviewing the chart including progress notes, labs, EKGs, taking medical decisions, ordering labs and documenting findings.  Please refer to admission H&P for details regarding the care of this patient

## 2024-10-29 NOTE — ED Notes (Signed)
 Notified RT of bipap order. RT on the way

## 2024-10-29 NOTE — Consult Note (Addendum)
 "  Cardiology Consultation   Patient ID: [name] MRN: [id number]; DOB: 09-May-1933  Admit date: 10/29/2024 Date of Consult: 10/29/2024  PCP:  Freddrick Johns   [facility] Providers Cardiologist:  Oneil Parchment, MD  Electrophysiologist:  Danelle Birmingham, MD       Patient Profile: [name] is a 89 y.o. female with a hx of  permanent AF, CHB s/p  biotronik DC PPM, LBBB, SVT, HFrEF, OSA oxygen, breast cancer, CKD stage IIIb, CVA, TDM-II, HTN,  PVD, history of DVT who is being seen 10/29/2024 for the evaluation of shortness of breath at the request of the hospitalist service.  History of Present Illness: [name] is on BiPAP at the time of this encounter.  History was severely limited.  Hospitalist note, patient was having progressively worsening shortness of breath and leg swelling over the past couple of days.  She went to her cardiologist today for evaluation, and it was suggested she go to the emergency department for management of congestive heart failure.    While in the ER, patient was hypoxemic and required up to 5 L oxygen before being converted to BiPAP.  Chest x-ray showed evidence of pulmonary edema.  proBNP was elevated to 7389, other notable labs included serum creatinine 1.68 (GFR of 29).  Patient was diuresed with 40 mg IV Lasix .  Upon my encounter, patient states her breathing had improved with interventions as stated above.  Denied chest pain or syncope.   Patient follows up with cardiology.  Pharmacological nuclear medicine stress test in 2019 was stopped early due to nausea and shortness of breath.  No further evaluation for coronary artery disease.  3 times daily score 0.98 patient has had several hospitalizations for congestive heart failure.  Most recent EF on 09/04/2024 shows an EF of 30 to 35%, grade 3 diastolic dysfunction, mild RV function, RVSP 49, severe left atrial dilation, small pericardial effusion, mild ovular disease.  For heart failure GDMT, patient is on  carvedilol  6.25 mg twice daily.  GDMT is limited by CKD (GFR approximately 17).  Diuretic regimen is torsemide  80 mg in the morning and 40 mg in the evening.  Patient has a left bundle branch block with a QRS of greater than 150.  Per chart review, patient has had a 10 kg weight gain since September 06, 2024.  Patient has a Biotronik dual-chamber pacemaker that was implanted in 2020.  Last interrogated on 08/01/2024.  Patient is currently VVIR lower rate limit of 60.  patient has a 82% V paced.   Past Medical History:  Diagnosis Date   Abnormal echocardiogram    a. possible mass on echo 05/2019 on PPM, b. 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 Previously diastolic but patient AVS from outside hospital listed systolic CHF and cardiomyopathy, records pending.   CKD (chronic kidney disease), stage III (HCC)    Clotting disorder    CVA (cerebral vascular accident) (HCC)    residual right sided weakness and mild dysphagia   Diabetes mellitus (HCC)    Dizziness and giddiness    DVT (deep venous thrombosis) (HCC)    a. on anticoagulation for this.   Essential hypertension    LBBB (  left bundle branch block)    Mitral regurgitation    a. Mod MR by echo 2014.   Muscular deconditioning    Obesity    Pacemaker    Pericardial effusion    SVT (supraventricular tachycardia)    a. In 2013 she had an EPS with ablation for SVT which did not eliminate the SVT completely. She also had bradycardia which limited medication. She was placed on amiodarone  by Dr. Waddell.   Thrombocytopenia 11/21/2011    Past Surgical History:  Procedure Laterality Date   BREAST BIOPSY Right 11/19/2018   positive   ESOPHAGOGASTRODUODENOSCOPY (EGD) WITH PROPOFOL  N/A 02/10/2022   Procedure:  ESOPHAGOGASTRODUODENOSCOPY (EGD) WITH PROPOFOL ;  Surgeon: Rollin Dover, MD;  Location: [facility] ENDOSCOPY;  Service: Gastroenterology;  Laterality: N/A;   EYE SURGERY     SUPRAVENTRICULAR TACHYCARDIA ABLATION N/A 10/11/2012   Procedure: SUPRAVENTRICULAR TACHYCARDIA ABLATION;  Surgeon: Danelle LELON Waddell, MD;  Location: [facility] CATH LAB;  Service: Cardiovascular;  Laterality: N/A;     Home Medications:  Prior to Admission medications  Medication Sig Start Date End Date Taking? Authorizing Provider  acetaminophen  (TYLENOL ) 325 MG tablet Take 650 mg by mouth in the morning, at noon, and at bedtime.    [provider]  amiodarone  (PACERONE ) 200 MG tablet Take one tablet by mouth daily Monday-Friday. Do NOT take Saturday and Sunday 06/21/22   Lesia Ozell Barter, PA-C  carvedilol  (COREG ) 6.25 MG tablet Take 6.25 mg by mouth 2 (two) times daily with a meal.    [provider]  Docusate Sodium  100 MG capsule Take 100 mg by mouth every morning.    [provider]  ELIQUIS  2.5 MG TABS tablet TAKE 1 TABLET(2.5 MG) BY MOUTH TWICE DAILY 07/13/20   Dunn, Dayna N, PA-C  fluticasone-salmeterol (ADVAIR) 250-50 MCG/ACT AEPB Inhale 1 puff into the lungs 2 (two) times daily. 08/16/24   [provider]  insulin  glargine (LANTUS ) 100 UNIT/ML injection Inject 7-12 Units into the skin See admin instructions. Inject 12 units into the skin in the morning and 7 units in the evening    [provider]  insulin  lispro (HUMALOG  KWIKPEN) 100 UNIT/ML KwikPen Before each meal 3 times a day, 140-199 - 2 units, 200-250 - 4 units, 251-299 - 6 units,  300-349 - 8 units,  350 or above 10 units. Patient taking differently: Inject 2-10 Units into the skin in the morning and at bedtime. If 150-199= 2 units, 200-250= 4 units, 251-299= 6 units, 301-349= 8 units, 350-399= 10 units, recheck 1 hour >400 call [employer] provider 06/05/23   Singh, Prashant K, MD  ipratropium-albuterol  (DUONEB) 0.5-2.5 (3) MG/3ML SOLN  Take 3 mLs by nebulization every 4 (four) hours as needed (for shortness of breath).    [provider]  isosorbide -hydrALAZINE  (BIDIL ) 20-37.5 MG tablet Take 1 tablet by mouth 3 (three) times daily. 09/12/24   Patel, Pranav M, MD  lidocaine (LIDODERM) 5 % Place 1 patch onto the skin daily. Apply to left posterior shoulder--Remove & Discard patch within 12 hours or as directed by MD    [provider]  Menthol, Topical Analgesic, (BIOFREEZE COOL THE PAIN) 4 % GEL Apply 1 application  topically every 12 (twelve) hours as needed (for osteoarthritis).    [provider]  mirabegron  ER (MYRBETRIQ ) 25 MG TB24 tablet Take 25 mg by mouth daily.    [provider]  omeprazole  (PRILOSEC OTC) 20 MG tablet Take 20 mg by mouth every morning.    [provider]  polyethylene glycol (MIRALAX  / GLYCOLAX ) 17 g packet Take 17 g by mouth daily as needed for mild constipation.    [provider]  potassium chloride  SA (KLOR-CON  M) 20 MEQ tablet Take 1 tablet (20 mEq total) by mouth daily. 10/25/24   Milford, Harlene HERO, FNP  pregabalin  (LYRICA ) 25 MG capsule Take 1 capsule (25 mg total) by mouth 2 (two) times daily. 09/12/24   Tobie Yetta HERO, MD  Propylene Glycol (SYSTANE COMPLETE OP) Place 1 drop into both eyes as needed (for itching and dry eye).    [provider]  simethicone  (MYLICON) 80 MG chewable tablet Chew 1 tablet (80 mg total) by mouth 4 (four) times daily. 09/12/24   Tobie Yetta HERO, MD  torsemide  (DEMADEX ) 20 MG tablet Take 4 tablets (80 mg total) by mouth in the morning AND 2 tablets (40 mg total) every evening. Take additional 20 mg daily as needed for weight gain of 3 lbs in 1 day or 5 lbs in 1 week. 10/25/24   Glena Harlene HERO, FNP    Scheduled Meds:  NOREEN ON 10/30/2024] furosemide   40 mg Intravenous Q12H   insulin  aspart  0-5 Units Subcutaneous QHS   [START ON 10/30/2024] insulin  aspart  0-9 Units Subcutaneous TID WC   insulin  glargine-yfgn   6 Units Subcutaneous QHS   nitroGLYCERIN   1 inch Topical Q6H   Continuous Infusions:  PRN Meds: acetaminophen  **OR** acetaminophen , ondansetron  **OR** ondansetron  (ZOFRAN ) IV  Allergies:   Allergies[1]  Social History:   Social History   Socioeconomic History   Marital status: Widowed    Spouse name: Not on file   Number of children: 3   Years of education: Not on file   Highest education level: High school graduate  Occupational History   Occupation: retired  Tobacco Use   Smoking status: Never   Smokeless tobacco: Never  Vaping Use   Vaping status: Never Used  Substance and Sexual Activity   Alcohol use: No   Drug use: No   Sexual activity: Not Currently  Other Topics Concern   Not on file  Social History Narrative   Lives at home with grandchild, daughter comes and help    Social Drivers of Health   Tobacco Use: Low Risk (10/29/2024)   Patient History    Smoking Tobacco Use: Never    Smokeless Tobacco Use: Never    Passive Exposure: Not on file  Financial Resource Strain: Low Risk (09/05/2024)   Overall Financial Resource Strain (CARDIA)    Difficulty of Paying Living Expenses: Not very hard  Food Insecurity: No Food Insecurity (09/04/2024)   Epic    Worried About Radiation Protection Practitioner of Food in the Last Year: Never true    Ran Out of Food in the Last Year: Never true  Transportation Needs: No Transportation Needs (09/04/2024)   Epic    Lack of Transportation (Medical): No    Lack of Transportation (Non-Medical): No  Physical Activity: Not on file  Stress: Not on file  Social Connections: Moderately Integrated (09/04/2024)   Social Connection and Isolation Panel    Frequency of Communication with Friends and Family: Three times a week    Frequency of Social Gatherings with Friends and Family: Three times a week    Attends Religious Services: 1 to 4 times per year    Active Member of Clubs or Organizations: No    Attends Banker Meetings: 1 to 4 times  per year    Marital Status: Widowed  Intimate Partner Violence: Not At Risk (09/04/2024)   Epic    Fear of Current or Ex-Partner: No    Emotionally Abused: No    Physically Abused: No    Sexually Abused: No  Depression (PHQ2-9): Not on file  Alcohol Screen: Low Risk (09/05/2024)   Alcohol Screen    Last Alcohol Screening Score (AUDIT): 0  Housing: Unknown (09/04/2024)   Epic    Unable to Pay for Housing in the Last Year: No    Number of Times Moved in the Last Year: 0    Homeless in the Last Year: Not on file  Recent Concern: Housing - High Risk (09/04/2024)   Epic    Unable to Pay for Housing in the Last Year: Yes    Number of Times Moved in the Last Year: Not on file    Homeless in the Last Year: No  Utilities: Not At Risk (09/04/2024)   Epic    Threatened with loss of utilities: No  Health Literacy: Not on file    Family History:    Family History  Problem Relation Age of Onset   Stroke Mother    Cancer Father        prostate   Hypertension Daughter    Heart attack Brother        x2   Hypertension Brother    Stroke Sister    Hypertension Sister    Breast cancer Maternal Aunt      ROS:  Please see the history of present illness.   All other ROS reviewed and negative.     Physical Exam/Data: Vitals:   10/29/24 1845 10/29/24 1846 10/29/24 2030 10/29/24 2115  BP: (!) 169/98  (!) 182/87 (!) 175/93  Pulse: (!) 101 (!) 102 81 88  Resp: (!) 29 (!) 33 (!) 27 (!) 32  Temp:      TempSrc:      SpO2: 95% 98% 99% 99%  Weight:      Height:       No intake or output data in the 24 hours ending 10/29/24 2155    10/29/2024   12:14 PM 10/29/2024    9:34 AM 10/25/2024   10:14 AM  Last 3 Weights  Weight (lbs) 201 lb 200 lb 204 lb  Weight (kg) 91.173 kg 90.719 kg 92.534 kg     Body mass index is 33.45 kg/m.  General:  In mild distress, on BIPAP HEENT: normal Neck: ABD not visualized.  Patient was on BiPAP Vascular: No carotid bruits; Distal pulses 2+  bilaterally Cardiac:  normal S1, S2; RRR; no murmur  Lungs:  clear to auscultation bilaterally, no wheezing, rhonchi or rales  Abd: soft, nontender, no hepatomegaly  Ext: no edema Musculoskeletal:  No deformities, BUE and BLE strength normal and equal Skin: warm and dry  Neuro:  CNs 2-12 intact, no focal abnormalities noted Psych:  Normal affect   EKG:  The EKG was personally reviewed and demonstrates: Atrial fibrillation with left bundle branch block. Telemetry:  Telemetry was personally reviewed and demonstrates: Viewed  Relevant CV Studies: Reviewed  Laboratory Data: High Sensitivity Troponin:  No results for input(s): TROPONINIHS in the last 720 hours.  Recent Labs  Lab 10/29/24 1351 10/29/24 1534  TRNPT 68* 66*      Chemistry Recent Labs  Lab 10/25/24 1046 10/29/24 1340  NA 144 146*  K 4.7 3.7  CL 103 104  CO2 31 30  GLUCOSE 163* 130*  BUN 33* 32*  CREATININE 1.98* 1.68*  CALCIUM  9.0 9.2  GFRNONAA 24* 29*  ANIONGAP 10 12    No results for input(s): PROT, ALBUMIN , AST, ALT, ALKPHOS, BILITOT in the last 168 hours. Lipids No results for input(s): CHOL, TRIG, HDL, LABVLDL, LDLCALC, CHOLHDL in the last 168 hours.  Hematology Recent Labs  Lab 10/25/24 1046 10/29/24 1340  WBC 2.9* 3.7*  RBC 3.22* 3.48*  HGB 9.3* 10.0*  HCT 30.8* 32.6*  MCV 95.7 93.7  MCH 28.9 28.7  MCHC 30.2 30.7  RDW 15.9* 16.0*  PLT 139* 143*   Thyroid  No results for input(s): TSH, FREET4 in the last 168 hours.  BNP Recent Labs  Lab 10/25/24 1046 10/29/24 1340  PROBNP 4,899.0* 7,389.0*    DDimer No results for input(s): DDIMER in the last 168 hours.  Radiology/Studies:  DG Chest 2 View Result Date: 10/29/2024 CLINICAL DATA:  Shortness of breath. EXAM: CHEST - 2 VIEW COMPARISON:  September 03, 2024 FINDINGS: There is stable dual lead AICD positioning. The cardiac silhouette is enlarged and unchanged in size. Mild perihilar prominence of the pulmonary  vasculature is seen. Mild to moderate severity diffusely increased interstitial lung markings are present. Mild linear scarring and/or atelectasis is noted within the mid left lung and bilateral lung bases. No pleural effusion or pneumothorax is identified. There is scoliosis of the thoracic spine with multilevel degenerative changes. IMPRESSION: 1. Cardiomegaly with mild to moderate severity interstitial edema. 2. Mild mid left lung and bibasilar linear scarring and/or atelectasis. Electronically Signed   By: Suzen Dials M.D.   On: 10/29/2024 13:34     Assessment and Plan:  MYRLE WANEK is a 89 y.o. female with a hx of  permanent AF, CHB s/p  biotronik DC PPM, LBBB, SVT, HFrEF, OSA oxygen, breast cancer, CKD stage IIIb, CVA, TDM-II, HTN,  PVD, history of DVT who is admitted for congestive heart failure.  Volume assessment limited upon my encounter.  Warm on exam.  No evidence of organ malperfusion.  NYHA class II symptoms. Unclear etiology of cardiomyopathy.  She has permanent A-fib and is predominantly V paced with 82% pacing burden.  She also has a left bundle branch block.  Both could be contributing to worsening EF.  She has mild chronic myocardial injury in the setting of chronic kidney disease.  No clinical signs of objective findings suggestive of ischemia.  Will aggressively diurese while she is inpatient   Spot diuresis with 80mg  IV lasix  in AM Strict I/O's Goal K>4, MG>2 Continue beta blocker (further GDMT limited by kidney function)   Risk Assessment/Risk Scores:    TIMI Risk Score for Unstable Angina or Non-ST Elevation MI:   The patient's TIMI risk score is  , which indicates a  % risk of all cause mortality, new or recurrent myocardial infarction or need for urgent revascularization in the next 14 days.  New York  Heart Association (NYHA) Functional Class NYHA Class II  CHA2DS2-VASc Score = 8   This indicates a 10.8% annual risk of stroke. The patient's score is based  upon: CHF History: 1 HTN History: 1 Diabetes History: 1 Stroke History: 2 Vascular Disease History: 0 Age Score: 2 Gender Score: 1        For questions or updates, please contact [facility] Please consult [email or web address] for contact info under      Signed, Haasini Patnaude A Kenlee Vogt, MD  10/29/2024 9:55 PM      [1]  Allergies Allergen Reactions   Aspirin Itching   Penicillins Itching and Rash  DID THE REACTION INVOLVE: Swelling of the face/tongue/throat, SOB, or low BP? Yes Sudden or severe rash/hives, skin peeling, or the inside of the mouth or nose? No Did it require medical treatment? Yes When did it last happen? More than 10 years ago If all above answers are NO, may proceed with cephalosporin use.  Tolerated full course of Unasyn  in 2021.   Remeron  [Mirtazapine ] Other (See Comments)    Unknown reaction   "

## 2024-10-29 NOTE — ED Notes (Signed)
 RT to bedside

## 2024-10-29 NOTE — Patient Instructions (Signed)
" ° °  Other Instructions Please proceed to the ER for fluid overload.         "

## 2024-10-30 ENCOUNTER — Other Ambulatory Visit: Payer: Self-pay

## 2024-10-30 ENCOUNTER — Inpatient Hospital Stay (HOSPITAL_COMMUNITY)

## 2024-10-30 DIAGNOSIS — C50311 Malignant neoplasm of lower-inner quadrant of right female breast: Secondary | ICD-10-CM | POA: Diagnosis not present

## 2024-10-30 DIAGNOSIS — I4821 Permanent atrial fibrillation: Secondary | ICD-10-CM

## 2024-10-30 DIAGNOSIS — I5021 Acute systolic (congestive) heart failure: Secondary | ICD-10-CM

## 2024-10-30 DIAGNOSIS — Z170 Estrogen receptor positive status [ER+]: Secondary | ICD-10-CM | POA: Diagnosis not present

## 2024-10-30 DIAGNOSIS — E66811 Obesity, class 1: Secondary | ICD-10-CM | POA: Diagnosis not present

## 2024-10-30 DIAGNOSIS — I10 Essential (primary) hypertension: Secondary | ICD-10-CM

## 2024-10-30 DIAGNOSIS — N1832 Chronic kidney disease, stage 3b: Secondary | ICD-10-CM | POA: Diagnosis not present

## 2024-10-30 DIAGNOSIS — I442 Atrioventricular block, complete: Secondary | ICD-10-CM

## 2024-10-30 DIAGNOSIS — I824Z1 Acute embolism and thrombosis of unspecified deep veins of right distal lower extremity: Secondary | ICD-10-CM

## 2024-10-30 DIAGNOSIS — E785 Hyperlipidemia, unspecified: Secondary | ICD-10-CM | POA: Diagnosis not present

## 2024-10-30 DIAGNOSIS — I480 Paroxysmal atrial fibrillation: Secondary | ICD-10-CM | POA: Diagnosis not present

## 2024-10-30 DIAGNOSIS — I5023 Acute on chronic systolic (congestive) heart failure: Secondary | ICD-10-CM

## 2024-10-30 DIAGNOSIS — E1169 Type 2 diabetes mellitus with other specified complication: Secondary | ICD-10-CM

## 2024-10-30 DIAGNOSIS — K219 Gastro-esophageal reflux disease without esophagitis: Secondary | ICD-10-CM | POA: Diagnosis not present

## 2024-10-30 LAB — CBC
HCT: 30.9 % — ABNORMAL LOW (ref 36.0–46.0)
Hemoglobin: 9.3 g/dL — ABNORMAL LOW (ref 12.0–15.0)
MCH: 28.2 pg (ref 26.0–34.0)
MCHC: 30.1 g/dL (ref 30.0–36.0)
MCV: 93.6 fL (ref 80.0–100.0)
Platelets: 129 K/uL — ABNORMAL LOW (ref 150–400)
RBC: 3.3 MIL/uL — ABNORMAL LOW (ref 3.87–5.11)
RDW: 15.9 % — ABNORMAL HIGH (ref 11.5–15.5)
WBC: 3.8 K/uL — ABNORMAL LOW (ref 4.0–10.5)
nRBC: 0 % (ref 0.0–0.2)

## 2024-10-30 LAB — COMPREHENSIVE METABOLIC PANEL WITH GFR
ALT: 20 U/L (ref 0–44)
AST: 23 U/L (ref 15–41)
Albumin: 3.8 g/dL (ref 3.5–5.0)
Alkaline Phosphatase: 69 U/L (ref 38–126)
Anion gap: 11 (ref 5–15)
BUN: 32 mg/dL — ABNORMAL HIGH (ref 8–23)
CO2: 31 mmol/L (ref 22–32)
Calcium: 8.9 mg/dL (ref 8.9–10.3)
Chloride: 107 mmol/L (ref 98–111)
Creatinine, Ser: 1.63 mg/dL — ABNORMAL HIGH (ref 0.44–1.00)
GFR, Estimated: 30 mL/min — ABNORMAL LOW
Glucose, Bld: 160 mg/dL — ABNORMAL HIGH (ref 70–99)
Potassium: 3.6 mmol/L (ref 3.5–5.1)
Sodium: 149 mmol/L — ABNORMAL HIGH (ref 135–145)
Total Bilirubin: 0.5 mg/dL (ref 0.0–1.2)
Total Protein: 6.6 g/dL (ref 6.5–8.1)

## 2024-10-30 LAB — ECHOCARDIOGRAM LIMITED
Area-P 1/2: 4.36 cm2
Height: 65 in
S' Lateral: 3.6 cm
Single Plane A4C EF: 34.5 %
Weight: 3216 [oz_av]

## 2024-10-30 LAB — CBG MONITORING, ED
Glucose-Capillary: 139 mg/dL — ABNORMAL HIGH (ref 70–99)
Glucose-Capillary: 213 mg/dL — ABNORMAL HIGH (ref 70–99)

## 2024-10-30 LAB — GLUCOSE, CAPILLARY
Glucose-Capillary: 233 mg/dL — ABNORMAL HIGH (ref 70–99)
Glucose-Capillary: 233 mg/dL — ABNORMAL HIGH (ref 70–99)

## 2024-10-30 LAB — MAGNESIUM: Magnesium: 2.3 mg/dL (ref 1.7–2.4)

## 2024-10-30 LAB — PHOSPHORUS: Phosphorus: 4.6 mg/dL (ref 2.5–4.6)

## 2024-10-30 MED ORDER — FUROSEMIDE 10 MG/ML IJ SOLN
80.0000 mg | Freq: Two times a day (BID) | INTRAMUSCULAR | Status: DC
  Administered 2024-10-30 – 2024-11-03 (×8): 80 mg via INTRAVENOUS
  Filled 2024-10-30 (×8): qty 8

## 2024-10-30 MED ORDER — ISOSORB DINITRATE-HYDRALAZINE 20-37.5 MG PO TABS
1.0000 | ORAL_TABLET | Freq: Three times a day (TID) | ORAL | Status: DC
  Administered 2024-10-30 – 2024-11-04 (×16): 1 via ORAL
  Filled 2024-10-30 (×16): qty 1

## 2024-10-30 MED ORDER — PERFLUTREN LIPID MICROSPHERE
1.0000 mL | INTRAVENOUS | Status: AC | PRN
  Administered 2024-10-30: 2 mL via INTRAVENOUS

## 2024-10-30 MED ORDER — APIXABAN 2.5 MG PO TABS
2.5000 mg | ORAL_TABLET | Freq: Two times a day (BID) | ORAL | Status: DC
  Administered 2024-10-30 – 2024-11-04 (×11): 2.5 mg via ORAL
  Filled 2024-10-30 (×11): qty 1

## 2024-10-30 MED ORDER — DOCUSATE SODIUM 100 MG PO CAPS
100.0000 mg | ORAL_CAPSULE | Freq: Every morning | ORAL | Status: DC
  Administered 2024-10-30 – 2024-11-04 (×6): 100 mg via ORAL
  Filled 2024-10-30 (×6): qty 1

## 2024-10-30 MED ORDER — FUROSEMIDE 10 MG/ML IJ SOLN
40.0000 mg | Freq: Once | INTRAMUSCULAR | Status: AC
  Administered 2024-10-30: 40 mg via INTRAVENOUS
  Filled 2024-10-30: qty 4

## 2024-10-30 MED ORDER — AMIODARONE HCL 200 MG PO TABS
200.0000 mg | ORAL_TABLET | Freq: Every day | ORAL | Status: DC
  Administered 2024-10-30: 200 mg via ORAL
  Filled 2024-10-30: qty 1

## 2024-10-30 MED ORDER — PANTOPRAZOLE SODIUM 40 MG PO TBEC
40.0000 mg | DELAYED_RELEASE_TABLET | Freq: Every morning | ORAL | Status: DC
  Administered 2024-10-30 – 2024-11-04 (×6): 40 mg via ORAL
  Filled 2024-10-30 (×6): qty 1

## 2024-10-30 NOTE — ED Notes (Signed)
 Writer spoke to [place], [place]. Pt complaining of bipap discomfort. Per Luke, place pt on 2L [place].

## 2024-10-30 NOTE — Assessment & Plan Note (Addendum)
 Patient was placed on insulin  sliding scale for glucose cover and monitoring, plus basal insulin . Her glucose remained well controlled, plan to continue her usual insulin  regimen at SNF   Continue statin therapy

## 2024-10-30 NOTE — Progress Notes (Signed)
" °  Echocardiogram 2D Echocardiogram has been performed.  Tinnie FORBES Gosling RDCS 10/30/2024, 3:28 PM "

## 2024-10-30 NOTE — Assessment & Plan Note (Addendum)
 Limited echocardiogram with reduced LV systolic function 30 to 35%, global hypokinesis, mild dilatated LV cavity, moderate LVH, RV systolic function with moderate reduction, LA with severe dilatation and moderate dilatation RA, small pericardial effusion, mild mitral valve regurgitation   Urine output 950 ml (documented)  Systolic blood pressure 150 to 160 mmHg.   Continue diuresis with furosemide  80 mg IV bid.  Continue afterload reduction with hydralazine / isosorbide .  Hold on carvedilol  for now due to low EF.  Continue blood pressure monitoring.  Limited therapy due to reduced GFR.

## 2024-10-30 NOTE — Plan of Care (Signed)
   Problem: Clinical Measurements: Goal: Respiratory complications will improve Outcome: Progressing   Problem: Safety: Goal: Ability to remain free from injury will improve Outcome: Progressing

## 2024-10-30 NOTE — Consult Note (Signed)
 "   ELECTROPHYSIOLOGY CONSULT NOTE    Patient ID: [name] MRN: [id number], DOB/AGE: December 24, 1932 89 y.o.  Admit date: 10/29/2024 Date of Consult: 10/30/2024  Primary Physician: Pcp, No Primary Cardiologist: Oneil Parchment, MD  Electrophysiologist: Dr. Almetta   Referring Provider: Dr. Noralee  Patient Profile: [name] is a 89 y.o. female with a history of permanent AF, CHB s/p PPM, LBBB, SVT, HFpEF who is being seen today for the evaluation of PPM optimization at the request of Dr. Sheena.  HPI:  [name] is a 89 y.o. female who presented to [facility] ER on 10/30/23 with shortness of breath and lower extremity swelling.   She was recently admitted 11/11-11/20/25 for decompensated systolic heart failure.  She was diuresed during that admit but a rise in renal function limited additional diuresis (baseline Cr 1.5-1.8).  She clinically improved with diuresis & was discharged back to SNF.    The patient lives at a facility in [place].  She is bed to wheelchair dependent. She pivots from chair to bed.  She reports she has been short of breath for weeks but no one would listen to her.  She has had increasing shortness of breath and lower extremity swelling. She has been wearing compression stockings and they helped some but she continued to swell. She indicates she can not lie flat due to shortness of breath.   She had a dual chamber PPM implanted in [place]  in 03/2019.  She has a pre-existing LBBB before implant.  EKG shows QRS 150-136ms.  Admit labs notable for Na+ 149, K 3.6, BUN 32, Cr 1.63, Mg 2.3, ALT 20, AST 23, WBC 3.8, Hgb 9.3, platelets 129 (baseline 120-140).  She denies chest pain, palpitations, dyspnea, PND, orthopnea, nausea, vomiting, dizziness, syncope, edema, weight gain, or early satiety.   Labs Potassium3.6 (01/07 0544) Magnesium   2.3 (01/07 0544) Creatinine, ser  1.63* (01/07 0544) PLT  129* (01/07 0544) HGB  9.3* (01/07 0544) WBC 3.8* (01/07 0544)  .    Past  Medical History:  Diagnosis Date   Abnormal echocardiogram    a. possible mass on echo 05/2019 on PPM, b. 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 Previously diastolic but patient AVS from outside hospital listed systolic CHF and cardiomyopathy, records pending.   CKD (chronic kidney disease), stage III (HCC)    Clotting disorder    CVA (cerebral vascular accident) (HCC)    residual right sided weakness and mild dysphagia   Diabetes mellitus (HCC)    Dizziness and giddiness    DVT (deep venous thrombosis) (HCC)    a. on anticoagulation for this.   Essential hypertension    LBBB (left bundle branch block)    Mitral regurgitation    a. Mod MR by echo 2014.   Muscular deconditioning    Obesity    Pacemaker    Pericardial effusion    SVT (supraventricular tachycardia)    a. In 2013 she had an EPS with ablation for SVT which did not eliminate the SVT completely. She also had bradycardia which limited medication. She was placed on amiodarone  by Dr. Waddell.   Thrombocytopenia 11/21/2011     Surgical History:  Past Surgical History:  Procedure Laterality Date   BREAST BIOPSY Right 11/19/2018   positive   ESOPHAGOGASTRODUODENOSCOPY (EGD) WITH PROPOFOL  N/A 02/10/2022   Procedure: ESOPHAGOGASTRODUODENOSCOPY (EGD) WITH PROPOFOL ;  Surgeon: Rollin Dover, MD;  Location: [facility] ENDOSCOPY;  Service: Gastroenterology;  Laterality: N/A;   EYE SURGERY     SUPRAVENTRICULAR TACHYCARDIA ABLATION N/A 10/11/2012   Procedure: SUPRAVENTRICULAR TACHYCARDIA ABLATION;  Surgeon: Danelle LELON Birmingham, MD;  Location: [facility] CATH LAB;  Service: Cardiovascular;  Laterality: N/A;     (Not in a hospital admission)   Inpatient Medications:   amiodarone   200 mg Oral Daily   apixaban   2.5 mg Oral BID    docusate sodium   100 mg Oral q morning   furosemide   80 mg Intravenous BID   insulin  aspart  0-5 Units Subcutaneous QHS   insulin  aspart  0-9 Units Subcutaneous TID WC   insulin  glargine-yfgn  6 Units Subcutaneous QHS   isosorbide -hydrALAZINE   1 tablet Oral TID   nitroGLYCERIN   1 inch Topical Q6H   pantoprazole   40 mg Oral q morning    Allergies: Allergies[1]  Family History  Problem Relation Age of Onset   Stroke Mother    Cancer Father        prostate   Hypertension Daughter    Heart attack Brother        x2   Hypertension Brother    Stroke Sister    Hypertension Sister    Breast cancer Maternal Aunt      Physical Exam: Vitals:   10/30/24 0945 10/30/24 1000 10/30/24 1015 10/30/24 1030  BP: (!) 151/77 (!) 143/74 (!) 154/77 (!) 154/74  Pulse:  (!) 59 (!) 59 (!) 59  Resp: 17 (!) 23 19 20   Temp:      TempSrc:      SpO2:  100% 100% 100%  Weight:      Height:        GEN- chronically ill appearing adult female in NAD, awake / alert with normal affect HEENT: Normocephalic, atraumatic Lungs- CTAB, Normal effort.  Heart- Irregularly irregular rate and rhythm, No M/G/R.  GI- Soft, NT, ND.  Extremities- No clubbing, cyanosis. 2-3+ pitting LE edema   Radiology/Studies: DG Chest 2 View Result Date: 10/29/2024 CLINICAL DATA:  Shortness of breath. EXAM: CHEST - 2 VIEW COMPARISON:  September 03, 2024 FINDINGS: There is stable dual lead AICD positioning. The cardiac silhouette is enlarged and unchanged in size. Mild perihilar prominence of the pulmonary vasculature is seen. Mild to moderate severity diffusely increased interstitial lung markings are present. Mild linear scarring and/or atelectasis is noted within the mid left lung and bilateral lung bases. No pleural effusion or pneumothorax is identified. There is scoliosis of the thoracic spine with multilevel degenerative changes. IMPRESSION: 1. Cardiomegaly with mild to moderate severity interstitial edema. 2. Mild mid left lung and  bibasilar linear scarring and/or atelectasis. Electronically Signed   By: Suzen Dials M.D.   On: 10/29/2024 13:34    EKG:10/30/23 AF with intermittent VP 64 bpm(personally reviewed)  TELEMETRY: AF/AFL with VP 60-70's (personally reviewed)  DEVICE HISTORY:  Biotronik Dual Chamber PPM implanted 03/2019 for CHB    Assessment/Plan:  CHB s/p Biotronik PPM  Programmed VVI 60 due to permanent AF  -pt interrogated at bedside with normal interrogation > LRL was programmed at 60 bpm, reduced her to 50 bpm and turned on -5 for hysteresis.  She conducts in the 50's at rest and when awake HR increases to the mid 60's.  Given presence of LBBB pre-PPM  implant, this change is not likely to improve her LVEF  -histogram shows well controlled ventricular rates  -will follow remotes for ventricular pacing % with changes as above  -given patients limited mobility (bed bound / stand to pivot to chair), inability to lie flat, risks of device upgrade may outweigh benefit of additional lead but will discuss with patient and family  Permanent Atrial Fibrillation  SVT CHA2DS2-VASc 8 -given permanent AF, will plan to stop amiodarone  due to age / risk factors and no longer pursuing attempts at rhythm control  -coreg  on hold per Cardiology  -[facility] for stroke prophylaxis   Secondary Hypercoagulable State  -continue Eliquis  2.5mg  BID, dose reviewed and appropriate by wt / Cr   Acute on Chronic HFrEF  LVEF 30-35% 08/2024, unclear etiology of cardiomyopathy -GDMT per Cardiology -diuresis as renal function / BP permit  -follow strict I/O's, wt trend  -defer amyloid work up to Cardiology    Pancytopenia  -per primary   For questions or updates, please contact [facility] Please consult [email or web address] for contact info under     Signed, Daphne Barrack, NP-C, AGACNP-BC [facility] - Electrophysiology  10/30/2024, 11:38 AM       [1]  Allergies Allergen Reactions   Aspirin Itching    Penicillins Itching and Rash    DID THE REACTION INVOLVE: Swelling of the face/tongue/throat, SOB, or low BP? Yes Sudden or severe rash/hives, skin peeling, or the inside of the mouth or nose? No Did it require medical treatment? Yes When did it last happen? More than 10 years ago If all above answers are NO, may proceed with cephalosporin use.  Tolerated full course of Unasyn  in 2021.   Remeron  [Mirtazapine ] Other (See Comments)    Unknown reaction   "

## 2024-10-30 NOTE — Assessment & Plan Note (Addendum)
 continue Bidil  and aggressive diuresis with IV furosemide  Continue blood pressure monitoring

## 2024-10-30 NOTE — Assessment & Plan Note (Signed)
"     Continue pantoprazole         "

## 2024-10-30 NOTE — Progress Notes (Addendum)
 " Progress Note   Patient: [name] FMW:[id number] DOB: 25-Dec-1932 DOA: 10/29/2024     1 DOS: the patient was seen and examined on 10/30/2024   Brief hospital course: [name] was admitted to the hospital with the working diagnosis of heart failure   89 yo female nursing home resident with the past medical history of atrial fibrillation, SVT, heart failure, CKD, CVA, T2DM, hypertension, heart block sp pacemaker, who presents with dyspnea.  Reported 4 days of lower extremity edema, that progressed to dyspnea on exertion, despite increase in her oral diuretic therapy. She was evaluated by cardiology on the day of admission as outpatient, she was found volume overloaded and refer to the ED.  On her initial physical examination her blood pressure was 152/90, HR 102, RR 33 and 02 saturation was 89% Lungs with diffuse rales, heart with S1 and S2 present and irregular, abdomen soft not distended, and positive lower extremity edema +++   Assessment and Plan: * Acute on chronic systolic CHF (congestive heart failure) (HCC) 08/2024 echocardiogram with reduced LV systolic function 30 to 35%, moderate LVH, grade III diastolic dysfunction, RV systolic function mildly reduced, RVSP 49.6 mmHg, LA with severe dilatation, small pericardial effusion, mild mitral regurgitation.   She continue volume overloaded.  Systolic blood pressure 150 to 170 mmHg.   Continue diuresis with furosemide  80 mg IV bid.  Resume afterload reduction with hydralazine / isosorbide .  Hold on carvedilol  for now due to low EF.  Continue blood pressure monitoring.  Limited therapy due to reduced GFR.    Paroxysmal atrial fibrillation (HCC) Continue amiodarone  for rate control  She has pacemaker in place.  Continue anticoagulation with apixaban  Continue telemetry monitoring   HTN (hypertension) Resume Bidil  and continue aggressive diuresis with IV furosemide  Continue blood pressure monitoring   Chronic kidney disease, stage  3b (HCC) Hypernatremia, Baseline serum cr is about 1.5 (per old records review)  Today her serum cr is 1,49 with K at 3,7 and serum bicarbonate is 27 and Na 149   Plan to continue diuresis with IV furosemide  Follow up renal function and electrolytes in am.    Type 2 diabetes mellitus with hyperlipidemia (HCC) Continue insulin  sliding scale for glucose cover and monitoring Continue basal insulin   Fasting glucose today 160 mg/dl   GERD (gastroesophageal reflux disease) Continue pantoprazole    hx of DVT Continue anticoagulation with apixaban    Malignant neoplasm of lower-inner quadrant of right breast of female, estrogen receptor positive (HCC) Follow as outpatient.   Obesity, class 1 Calculated BMI 33.4       Subjective: Patient was feeling better but not yet back to baseline, continue to have dyspnea and lower extremity edema.   Physical Exam: Vitals:   10/30/24 0945 10/30/24 1000 10/30/24 1015 10/30/24 1030  BP: (!) 151/77 (!) 143/74 (!) 154/77 (!) 154/74  Pulse:  (!) 59 (!) 59 (!) 59  Resp: 17 (!) 23 19 20   Temp:      TempSrc:      SpO2:  100% 100% 100%  Weight:      Height:       Neurology awake and alert ENT with mild pallor Cardiovascular with S1 and S2 present, and irregular, with no gallops or rubs, no murmurs Positive JVD Respiratory with positive rales bilaterally with no wheezing Abdomen protuberant, soft and not distended Positive pitting lower extremity edema +++  Data Reviewed:    Family Communication: no family at the bedside   Disposition: Status is: Inpatient Remains inpatient  appropriate because: IV diuresis   Planned Discharge Destination: Skilled nursing facility     Author: Elidia Toribio Furnace, MD 10/30/2024 10:45 AM  For on call review [email or web address].  "

## 2024-10-30 NOTE — Assessment & Plan Note (Signed)
Follow as outpatient  °

## 2024-10-30 NOTE — Progress Notes (Addendum)
 "  Progress Note  Patient Name: [name] Date of Encounter: 10/30/2024 [facility] Cardiologist: Oneil Parchment, MD   Interval Summary   Unclear of output.  Reports she is peeing okay and breathing has improved.  No other complaints today.  Family not in the room.  Vital Signs Vitals:   10/30/24 0700 10/30/24 0715 10/30/24 0730 10/30/24 0735  BP: (!) 155/86 (!) 168/77 (!) 165/83   Pulse: 63 61 62   Resp: 12 20 17    Temp:    97.6 F (36.4 C)  TempSrc:    Oral  SpO2: 100% 100% 100%   Weight:      Height:        Intake/Output Summary (Last 24 hours) at 10/30/2024 0832 Last data filed at 10/30/2024 0130 Gross per 24 hour  Intake --  Output 500 ml  Net -500 ml      10/29/2024   12:14 PM 10/29/2024    9:34 AM 10/25/2024   10:14 AM  Last 3 Weights  Weight (lbs) 201 lb 200 lb 204 lb  Weight (kg) 91.173 kg 90.719 kg 92.534 kg      Telemetry/ECG  Atrial fibrillation heart rates in the 60s- Personally Reviewed  Physical Exam  GEN: No acute distress.   Neck: Significant JVD Cardiac: IRRR, no murmurs, rubs, or gallops.  Respiratory: Clear to auscultation bilaterally. GI: Soft, nontender, non-distended  MS: 2+ edema  Patient Profile Patient with past medical history significant for chronic HFrEF, permanent atrial fibrillation, CHB with PPM, LBBB, SVT, history of DVT on Eliquis , hypertension, CKD, chronic anemia/thrombocytopenia, diabetes.  Patient returns for early readmission for CHF exacerbation, recent admission 08/2024.  Seen in the office in recommend hospital admission again.  Reported compliancy with all of her medications at home.  Assessment & Plan   Acute on chronic HFrEF -08/2024 EF 30 to 35%, moderate concentric LVH, G3 DD, mildly reduced RV.  RVSP 49.6.  Mild MR. Etiology of her cardiomyopathy has not been identified.  Has not had recent ischemic evaluation.  Cardiomyopathy may be related to atrial fibrillation, RV pacing, left bundle branch block.  She  returns for early readmission and severely volume up on exam.  Output unclear and not documented. Increase IV Lasix  40 mg to 80 mg twice daily.  Monitor urinary output closely, may need to be on a drip.  Will give another dose of IV Lasix  40 mg now and then start with 80 mg tonight. GDMT limited by frailty and CKD.  Hold beta-blocker for now. There were some considerations of upgrading device with underlying left on a branch block.  Defer to EP.  Will also see if they will view device settings given concerns of RV pacing/settings Differential for her cardiomyopathy includes possible infiltrative disease.  PYP scan could be ordered outpatient.  Cardiac MRI may be possible if her leads are compatible, defer to EP.  Previous SPEP/multiple myeloma panel unremarkable. Will repeat limited echocardiogram here given effusion and change in clinical status without clear triggers. Appears to be up approximately 8 pounds from previous admission.   Permanent atrial fibrillation Rate controlled with heart rates in the 60s. Chronically has been on amiodarone  200 mg M-F per EP. Continue Eliquis  2.5 mg twice daily for age and renal function.  CHB status post PPM/bradycardia Follow-up with EP.  [facility] Palliative care consult may be reasonable given recurrent hospitalizations for CHF and advanced age.  Given this not sure how aggressive we want to be about device upgrades and  workup for cardiac amyloid   For questions or updates, please contact [facility] Please consult [email or web address] for contact info under       Signed, Thom LITTIE Sluder, PA-C    Patient seen and examined, note reviewed with the signed Advanced Practice Provider. I personally reviewed laboratory data, imaging studies and relevant notes. I independently examined the patient and formulated the important aspects of the plan. I have personally discussed the plan with the patient and/or family. Comments or changes to the note/plan are  indicated below.  Acute on chronic heart failure with reduced ejection fraction Permanent atrial fibrillation Complete heart block status post pacemaker   She is significantly fluid overloaded in the hospital today.  We have started IV diuretics appears that she is giving good urine output though the documented does not appear to align with what is in the canister by her bedside.  Continue the Lasix  80 mg twice a day will continue closely with her kidney function.  If needed we will transition her to Lasix  drip for at least 24 hours but we will see what happens tomorrow given her recovery.  But she tells me she started to feel a lot better since being on the IV Lasix .  Agree with repeat echocardiogram .  Once she is discharged it will be good in the outpatient setting to discuss goals of care with patient and her daughter and granddaughter.   Laya Letendre DO, MS [facility] Attending Cardiologist [facility]  [address] [place], [place] [address] [phone] Website: [email or web address]  "

## 2024-10-30 NOTE — Hospital Course (Addendum)
 Mrs. Dittmer was admitted to the hospital with the working diagnosis of heart failure   89 yo female nursing home resident, wheelchair bound, with the past medical history of atrial fibrillation, SVT, heart failure, CKD, CVA, T2DM, hypertension, heart block sp pacemaker, who presents with dyspnea.  Reported 4 days of lower extremity edema, that progressed to dyspnea on exertion, despite increase in her oral diuretic therapy. She was evaluated by cardiology on the day of admission as outpatient, she was found volume overloaded and refer to the ED.  On her initial physical examination her blood pressure was 152/90, HR 102, RR 33 and 02 saturation was 89% Lungs with diffuse rales, heart with S1 and S2 present and irregular, abdomen soft not distended, and positive lower extremity edema +++  Na 146, K 3.7 Cl 104 bicarbonate 30 glucose 130 bun 32 cr 1,68  BNP 7,389  High sensitive troponin 68 and 66 Wbc 3,7 hgb 10.0 plt 143   Chest radiograph with hypoinflation, left rotation, positive cardiomegaly, with bilateral hilar vascular congestion, left pleural effusion.   EKG 64 bpm, left axis deviation, left bundle branch block, qtc 480, atrial flutter with variable block, intermittent ventricular pacing, with no significant ST segment or T wave changes.   Patient placed on furosemide  for diuresis,   01/09 responding to diuresis.  01/10 continue need for IV diuresis.  01/11 continue diuresis  01/12 improved volume status, plan to continue diuresis and have close follow up. Plan for outpatient palliative care.

## 2024-10-30 NOTE — Assessment & Plan Note (Signed)
"  Continue anticoagulation with apixaban   "

## 2024-10-30 NOTE — Assessment & Plan Note (Addendum)
 Continue amiodarone  for rate control  She has pacemaker in place.  Continue anticoagulation with apixaban .

## 2024-10-30 NOTE — Assessment & Plan Note (Addendum)
 Hypernatremia, hypokalemia Baseline serum cr is about 1.5 (per old records review)  Renal function with serum cr at 1,54 with K at 3,4 and serum bicarbonate at 34 Na 144 and Mg 2.1   Add one dose KCl 40 meq today  Follow up renal function and electrolytes in am.  Avoid hypotension or nephrotoxic medications  Transitioned to po torsemide 

## 2024-10-30 NOTE — Assessment & Plan Note (Signed)
 Calculated BMI 33.4

## 2024-10-31 ENCOUNTER — Encounter

## 2024-10-31 DIAGNOSIS — I5023 Acute on chronic systolic (congestive) heart failure: Secondary | ICD-10-CM | POA: Diagnosis not present

## 2024-10-31 DIAGNOSIS — I440 Atrioventricular block, first degree: Secondary | ICD-10-CM | POA: Diagnosis not present

## 2024-10-31 DIAGNOSIS — I480 Paroxysmal atrial fibrillation: Secondary | ICD-10-CM | POA: Diagnosis not present

## 2024-10-31 DIAGNOSIS — I10 Essential (primary) hypertension: Secondary | ICD-10-CM | POA: Diagnosis not present

## 2024-10-31 DIAGNOSIS — N1832 Chronic kidney disease, stage 3b: Secondary | ICD-10-CM | POA: Diagnosis not present

## 2024-10-31 LAB — GLUCOSE, CAPILLARY
Glucose-Capillary: 176 mg/dL — ABNORMAL HIGH (ref 70–99)
Glucose-Capillary: 179 mg/dL — ABNORMAL HIGH (ref 70–99)
Glucose-Capillary: 252 mg/dL — ABNORMAL HIGH (ref 70–99)
Glucose-Capillary: 212 mg/dL — ABNORMAL HIGH (ref 70–99)

## 2024-10-31 LAB — BASIC METABOLIC PANEL WITH GFR
Anion gap: 11 (ref 5–15)
BUN: 27 mg/dL — ABNORMAL HIGH (ref 8–23)
CO2: 31 mmol/L (ref 22–32)
Calcium: 8.8 mg/dL — ABNORMAL LOW (ref 8.9–10.3)
Chloride: 103 mmol/L (ref 98–111)
Creatinine, Ser: 1.44 mg/dL — ABNORMAL HIGH (ref 0.44–1.00)
GFR, Estimated: 35 mL/min — ABNORMAL LOW
Glucose, Bld: 269 mg/dL — ABNORMAL HIGH (ref 70–99)
Potassium: 3.6 mmol/L (ref 3.5–5.1)
Sodium: 144 mmol/L (ref 135–145)

## 2024-10-31 LAB — MRSA NEXT GEN BY PCR, NASAL: MRSA by PCR Next Gen: NOT DETECTED

## 2024-10-31 LAB — MAGNESIUM: Magnesium: 2.1 mg/dL (ref 1.7–2.4)

## 2024-10-31 NOTE — Evaluation (Signed)
 Occupational Therapy Evaluation & Discharge Patient Details Name: [name] MRN: [id number] DOB: 06-15-1933 Today's Date: 10/31/2024   History of Present Illness   89 yo F adm 1/6 acute CHF PMH chronic CHF, arthritis, anemia, cataract, CHB status post PPM, permanent A-fib on Eliquis , SVT, HFrEF, OSA on 2 L night only (BiPAP intolerant per pulmonology note), breast CA s/p  post antiestrogen therapy with anastrozole  11/28/2018-02/21/2022, CKDIII CVA, DM2, HTN,hx of DVT, GERD     Clinical Impressions Pt admitted based on above, and was seen based on problem list below. PTA pt was a LTC resident at [facility]. Pt reports working with PT/OT services on improving functional independence. Pt at baseline receives assistance for bathing, dressing, and toileting. PT reports being able to squat pivot transfer to w/c with supervision. Today pt is at baseline for ADLs. Completed squat pivot transfers to w/c and recliner at supervision level. Pt able to self-propel in w/c with feet. Pt would benefit from continued activity with mobility specialist, no acute OT needs. Pt can return to SNF and continue rehab services at facility. OT is signing off on this pt.      If plan is discharge home, recommend the following:   A little help with walking and/or transfers;A lot of help with bathing/dressing/bathroom;Assist for transportation     Functional Status Assessment   Patient has not had a recent decline in their functional status     Equipment Recommendations   None recommended by OT      Precautions/Restrictions   Precautions Precautions: Fall Recall of Precautions/Restrictions: Intact Restrictions Weight Bearing Restrictions Per Provider Order: No     Mobility Bed Mobility Overal bed mobility: Needs Assistance Bed Mobility: Supine to Sit     Supine to sit: Min assist     General bed mobility comments: HH assist for trunk    Transfers Overall transfer level: Needs assistance Equipment  used: None Transfers: Bed to chair/wheelchair/BSC   Stand pivot transfers: Supervision         General transfer comment: Pt completed squat pivot from bed to w/c to recliner. Able to propel self in hallway      Balance Overall balance assessment: Needs assistance Sitting-balance support: No upper extremity supported, Feet supported Sitting balance-Leahy Scale: Good Sitting balance - Comments: seated EOB         ADL either performed or assessed with clinical judgement   ADL Overall ADL's : At baseline     General ADL Comments: At baseline for ADLs pt requires assist with dressing, bathing, and toileting     Vision Baseline Vision/History: 1 Wears glasses Patient Visual Report: No change from baseline Vision Assessment?: No apparent visual deficits            Pertinent Vitals/Pain Pain Assessment Pain Assessment: No/denies pain     Extremity/Trunk Assessment Upper Extremity Assessment Upper Extremity Assessment: Generalized weakness   Lower Extremity Assessment Lower Extremity Assessment: Defer to PT evaluation   Cervical / Trunk Assessment Cervical / Trunk Assessment: Kyphotic   Communication Communication Communication: No apparent difficulties   Cognition Arousal: Alert Behavior During Therapy: WFL for tasks assessed/performed Cognition: No apparent impairments       Following commands: Intact       Cueing  General Comments   Cueing Techniques: Verbal cues  O2 91% on RA, replaced on 2L at end of session, pt O2 sats at 94%           Home Living Family/patient expects to be discharged to::  Skilled nursing facility       Additional Comments: Pt is a LTC resident of [name].      Prior Functioning/Environment Prior Level of Function : Needs assist       Physical Assist : ADLs (physical)     Mobility Comments: Pt reports she is mod I with transfers from bed <> WC, performing a stand pivot utilzing bed railing. Pt  self-propels manual WC around facility, and has been working with PT on standing balance. ADLs Comments: Pt reports she has been receiving assistance for bathing, dressing, and some assistance with toileting.    OT Problem List: Decreased strength;Decreased activity tolerance;Impaired balance (sitting and/or standing);Cardiopulmonary status limiting activity        OT Goals(Current goals can be found in the care plan section)   Acute Rehab OT Goals Patient Stated Goal: To get OOB OT Goal Formulation: All assessment and education complete, DC therapy Time For Goal Achievement: 11/14/24 Potential to Achieve Goals: Good   AM-PAC OT 6 Clicks Daily Activity     Outcome Measure Help from another person eating meals?: None Help from another person taking care of personal grooming?: A Little Help from another person toileting, which includes using toliet, bedpan, or urinal?: A Lot Help from another person bathing (including washing, rinsing, drying)?: A Lot Help from another person to put on and taking off regular upper body clothing?: A Little Help from another person to put on and taking off regular lower body clothing?: A Lot 6 Click Score: 16   End of Session Equipment Utilized During Treatment: Other (comment) (w/c) Nurse Communication: Mobility status  Activity Tolerance: Patient tolerated treatment well Patient left: in chair;with call bell/phone within reach;with chair alarm set;with family/visitor present  OT Visit Diagnosis: Unsteadiness on feet (R26.81);Other abnormalities of gait and mobility (R26.89);Muscle weakness (generalized) (M62.81)                Time: 1524-1600 OT Time Calculation (min): 36 min Charges:  OT General Charges $OT Visit: 1 Visit OT Evaluation $OT Eval Moderate Complexity: 1 Mod OT Treatments $Self Care/Home Management : 8-22 mins  Adrianne BROCKS, OT  Acute Rehabilitation Services Office [phone] Secure chat preferred   Adrianne GORMAN Savers 10/31/2024, 4:44 PM

## 2024-10-31 NOTE — Evaluation (Signed)
 Physical Therapy Evaluation Patient Details Name: [name] MRN: [id number] DOB: [birth year] Today's Date: 10/31/2024  History of Present Illness  89 yo F adm 1/6 acute CHF PMH chronic CHF, arthritis, anemia, cataract, CHB status post PPM, permanent A-fib on Eliquis , SVT, HFrEF, OSA on 2 L night only (BiPAP intolerant per pulmonology note), breast CA s/p  post antiestrogen therapy with anastrozole  11/28/2018-02/21/2022, CKDIII CVA, DM2, HTN,hx of DVT, GERD   Clinical Impression  Prior to admittance, pt was mod I with transfers from [facility] to bed, performing standing pivot, and was needing assistance with ADLs. Pt presents to evaluation with deficits in mobility, strength, power, activity tolerance, and balance. Pt was able to perform WC mobility with supervision, and complete stand pivot transfer from [facility] to bed with single UE support on bed railing (as per home setup) and no physical assistance. PT will continue to treat pt while she is admitted. Recommending follow up PT at pt's SNF to address remaining mobility deficits.         If plan is discharge home, recommend the following: A little help with walking and/or transfers;A little help with bathing/dressing/bathroom;Assist for transportation   Can travel by private vehicle        Equipment Recommendations None recommended by PT  Recommendations for Other Services       Functional Status Assessment Patient has had a recent decline in their functional status and demonstrates the ability to make significant improvements in function in a reasonable and predictable amount of time.     Precautions / Restrictions Precautions Precautions: Fall Recall of Precautions/Restrictions: Intact Restrictions Weight Bearing Restrictions Per Provider Order: No      Mobility  Bed Mobility Overal bed mobility: Needs Assistance Bed Mobility: Sit to Supine       Sit to supine: Mod assist   General bed mobility comments: Pt required moderate physical  assistance for sit to supine for BLE management. Pt is total A +2 for situating toward HOB via bed pad.    Transfers Overall transfer level: Needs assistance Equipment used: None Transfers: Bed to chair/wheelchair/BSC   Stand pivot transfers: Contact guard assist         General transfer comment: Pt completed stand pivot transfer from [facility] to bed on the L, utilizing L bed railing, to assist with stability while turning. VC for sequencing; increased time to complete.    Ambulation/Gait                  Administrator mobility: Yes Wheelchair propulsion: Both lower extermities Wheelchair parts: Supervision/cueing Distance: 25 Wheelchair Assistance Details (indicate cue type and reason): Pt able to propel self in manual WC utilizing BLEs.   Tilt Bed    Modified Rankin (Stroke Patients Only)       Balance Overall balance assessment: Needs assistance Sitting-balance support: No upper extremity supported, Feet supported Sitting balance-Leahy Scale: Good Sitting balance - Comments: seated EOB   Standing balance support: Single extremity supported, During functional activity Standing balance-Leahy Scale: Poor Standing balance comment: reliant on external support for bed railing for improved stability                             Pertinent Vitals/Pain Pain Assessment Pain Assessment: No/denies pain    Home Living Family/patient expects to be discharged to:: Skilled nursing facility  Additional Comments: Pt is a LTC resident of [name].    Prior Function Prior Level of Function : Needs assist       Physical Assist : ADLs (physical)     Mobility Comments: Pt reports she is mod I with transfers from bed <> WC, performing a stand pivot utilzing bed railing. Pt self-propels manual WC around facility, and has been working with PT on standing balance. ADLs  Comments: Pt reports she has been receiving assistance for bathing, specifically washing her back, for dressing, and some assistance with toileting.     Extremity/Trunk Assessment   Upper Extremity Assessment Upper Extremity Assessment: Defer to OT evaluation    Lower Extremity Assessment Lower Extremity Assessment: Generalized weakness    Cervical / Trunk Assessment Cervical / Trunk Assessment: Kyphotic  Communication   Communication Communication: No apparent difficulties    Cognition Arousal: Alert Behavior During Therapy: WFL for tasks assessed/performed   PT - Cognitive impairments: No apparent impairments                         Following commands: Intact       Cueing Cueing Techniques: Verbal cues     General Comments General comments (skin integrity, edema, etc.): SpO2 93% on RA    Exercises     Assessment/Plan    PT Assessment Patient needs continued PT services  PT Problem List Decreased strength;Decreased balance;Decreased activity tolerance;Decreased mobility       PT Treatment Interventions DME instruction;Functional mobility training;Therapeutic activities;Therapeutic exercise;Balance training;Wheelchair mobility training;Manual techniques    PT Goals (Current goals can be found in the Care Plan section)  Acute Rehab PT Goals Patient Stated Goal: to get stronger PT Goal Formulation: With patient Time For Goal Achievement: 11/14/24 Potential to Achieve Goals: Fair    Frequency Min 1X/week     Co-evaluation               AM-PAC PT 6 Clicks Mobility  Outcome Measure Help needed turning from your back to your side while in a flat bed without using bedrails?: A Little Help needed moving from lying on your back to sitting on the side of a flat bed without using bedrails?: A Lot Help needed moving to and from a bed to a chair (including a wheelchair)?: A Little Help needed standing up from a chair using your arms (e.g.,  wheelchair or bedside chair)?: A Little Help needed to walk in hospital room?: Total Help needed climbing 3-5 steps with a railing? : Total 6 Click Score: 13    End of Session Equipment Utilized During Treatment: Oxygen Activity Tolerance: Patient tolerated treatment well Patient left: in bed;with call bell/phone within reach;with bed alarm set Nurse Communication: Mobility status PT Visit Diagnosis: Other abnormalities of gait and mobility (R26.89);Muscle weakness (generalized) (M62.81)    Time: 8947-8881 PT Time Calculation (min) (ACUTE ONLY): 26 min   Charges:   PT Evaluation $PT Eval Low Complexity: 1 Low   PT General Charges $$ ACUTE PT VISIT: 1 Visit         Leontine Hilt DPT Acute Rehab Services [phone] Prefer contact via chat   Leontine NOVAK Hopelynn Gartland 10/31/2024, 11:39 AM

## 2024-10-31 NOTE — TOC Initial Note (Addendum)
 Transition of Care [facility]) - Initial/Assessment Note    Patient Details  Name: [name] MRN: [id number] Date of Birth: 1933-03-03  [facility] CM/SW Contact:    [name], LCSWA Phone Number: 10/31/2024, 11:32 AM  Clinical Narrative:     Patient from [name]. CSW followed up with Soy, AD at facility, about patient being able to return when MR. Per Soy, patient can return when medically ready and does not need prior insurance auth. CSW left vm for patients daughter, [name], for a call back.   11:34 AM [name] called CSW back and is okay with patient returning to [facility] when stable.   CSW will continue to follow.          Expected Discharge Plan: Skilled Nursing Facility Barriers to Discharge: Continued Medical Work up   Patient Goals and CMS Choice Patient states their goals for this hospitalization and ongoing recovery are:: To return to ltc   Choice offered to / list presented to : NA      Expected Discharge Plan and Services In-house Referral: Clinical Social Work   Post Acute Care Choice: Skilled Nursing Facility Living arrangements for the past 2 months: Skilled Nursing Facility                                      Prior Living Arrangements/Services Living arrangements for the past 2 months: Skilled Nursing Facility Lives with:: Facility Resident Patient language and need for interpreter reviewed:: Yes Do you feel safe going back to the place where you live?: Yes      Need for Family Participation in Patient Care: Yes (Comment) Care giver support system in place?: Yes (comment)   Criminal Activity/Legal Involvement Pertinent to Current Situation/Hospitalization: No - Comment as needed  Activities of Daily Living   ADL Screening (condition at time of admission) Independently performs ADLs?: No Does the patient have a NEW difficulty with bathing/dressing/toileting/self-feeding that is expected to last >3 days?:  No Does the patient have a NEW difficulty with getting in/out of bed, walking, or climbing stairs that is expected to last >3 days?: No Does the patient have a NEW difficulty with communication that is expected to last >3 days?: No Is the patient deaf or have difficulty hearing?: No Does the patient have difficulty seeing, even when wearing glasses/contacts?: No Does the patient have difficulty concentrating, remembering, or making decisions?: No  Permission Sought/Granted Permission sought to share information with : Facility Medical Sales Representative, Family Supports Permission granted to share information with : No (Patient from a ltc facility and family member is POA)  Share Information with NAME: Small,[name]  Daughter, Emergency Contact  [phone]  Permission granted to share info w AGENCY: [name]        Emotional Assessment Appearance:: Appears stated age Attitude/Demeanor/Rapport: Unable to Assess Affect (typically observed): Unable to Assess Orientation: : Oriented to Situation, Oriented to  Time, Oriented to Self, Oriented to Place Alcohol / Substance Use: Not Applicable Psych Involvement: No (comment)  Admission diagnosis:  First degree AV block [I44.0] Pacemaker [Z95.0] Acute exacerbation of CHF (congestive heart failure) (HCC) [I50.9] Essential hypertension [I10] Acute on chronic combined systolic and diastolic CHF (congestive heart failure) (HCC) [I50.43] Syncope, unspecified syncope type [R55] Acute on chronic congestive heart failure, unspecified heart failure type [facility]) [I50.9] Patient Active Problem List   Diagnosis Date Noted  Obesity, class 1 10/30/2024   Acute on chronic systolic CHF (congestive heart failure) (HCC) 10/29/2024   Cardiomyopathy (HCC) 09/05/2024   Permanent atrial fibrillation (HCC) 09/05/2024   Long term (current) use of anticoagulants 09/05/2024   Long term current use of antiarrhythmic drug 09/05/2024   Cardiac pacemaker in  situ 09/05/2024   Acute combined systolic and diastolic CHF, NYHA class 3 (HCC) 09/05/2024   Chronic systolic heart failure (HCC) 09/04/2024   OSA (obstructive sleep apnea) 07/06/2023   Slurred speech and right facial twitching 06/03/2023   Acute renal failure superimposed on stage 3b chronic kidney disease (HCC) 06/03/2023   Paroxysmal atrial fibrillation (HCC) 06/03/2023   Combined congestive systolic and diastolic heart failure (HCC) 06/03/2023   Pressure injury of skin 02/10/2022   Melena 02/08/2022   Chest pain 02/08/2022   Diabetic neuropathy (HCC) 12/10/2021   Diabetic nephropathy (HCC) 12/10/2021   Hyperglycemia due to type 2 diabetes mellitus (HCC) 12/10/2021   Pure hypercholesterolemia 12/10/2021   Osteoarthritis of knee 12/10/2021   Complete heart block (HCC) 05/14/2021   Acute on chronic combined systolic and diastolic CHF (congestive heart failure) (HCC) 10/08/2020   GERD (gastroesophageal reflux disease) 10/08/2020   Pacemaker 05/06/2020   Syncope 08/10/2019   Displaced fracture of lateral malleolus of right fibula, initial encounter for closed fracture 07/10/2019   Pain in left shoulder 07/10/2019   Personal history of breast cancer 04/22/2019   Malignant neoplasm of lower-inner quadrant of right breast of female, estrogen receptor positive (HCC) 11/23/2018   Hypertensive heart disease with heart failure (HCC) 09/05/2017   Aphasia 08/31/2017   Chronic kidney disease, stage 3b (HCC)    LBBB (left bundle branch block)    Other pancytopenia (HCC)    HTN (hypertension)    Obesity    hx of DVT    Type 2 diabetes mellitus with hyperlipidemia (HCC) 07/28/2015   Fall 07/28/2015   Warfarin-induced coagulopathy 07/28/2015   Acute on chronic diastolic CHF (congestive heart failure) (HCC) 03/19/2014   SVT (supraventricular tachycardia) (HCC) 10/15/2012   First degree AV block 10/15/2012   PCP:  Pcp, No Pharmacy:   [facility] [place], [place] - [address] [facility]  [address] [place] [address] Phone: [phone] Fax: [phone]     Social Drivers of Health (SDOH) Social History: SDOH Screenings   Food Insecurity: No Food Insecurity (10/30/2024)  Housing: Low Risk (10/30/2024)  Recent Concern: Housing - High Risk (09/04/2024)  Transportation Needs: No Transportation Needs (10/30/2024)  Utilities: Not At Risk (10/30/2024)  Alcohol Screen: Low Risk (09/05/2024)  Financial Resource Strain: Low Risk (09/05/2024)  Social Connections: Moderately Isolated (10/30/2024)  Tobacco Use: Low Risk (10/29/2024)   SDOH Interventions:     Readmission Risk Interventions     No data to display

## 2024-10-31 NOTE — Progress Notes (Signed)
 "  Progress Note  Patient Name: [name] Date of Encounter: 10/31/2024  Primary Cardiologist: Oneil Parchment, MD   Subjective   Patient seen examined by her bedside.  She was awake in bed when I arrived.  She tells me that shortness of breath is improving she is really happy about this.  Inpatient Medications    Scheduled Meds:  apixaban   2.5 mg Oral BID   docusate sodium   100 mg Oral q morning   furosemide   80 mg Intravenous BID   insulin  aspart  0-5 Units Subcutaneous QHS   insulin  aspart  0-9 Units Subcutaneous TID WC   insulin  glargine-yfgn  6 Units Subcutaneous QHS   isosorbide -hydrALAZINE   1 tablet Oral TID   pantoprazole   40 mg Oral q morning   Continuous Infusions:  PRN Meds: acetaminophen  **OR** acetaminophen , ondansetron  **OR** ondansetron  (ZOFRAN ) IV   Vital Signs    Vitals:   10/30/24 2330 10/31/24 0400 10/31/24 0740 10/31/24 1118  BP: 123/83 (!) 123/58  (!) 158/72  Pulse: 75 69  62  Resp: 13 19  18   Temp: 98.6 F (37 C) 98.4 F (36.9 C) 98.3 F (36.8 C) 98.4 F (36.9 C)  TempSrc: Oral Oral Oral Oral  SpO2: 100% 100%  94%  Weight:  87.4 kg    Height:        Intake/Output Summary (Last 24 hours) at 10/31/2024 1126 Last data filed at 10/31/2024 0800 Gross per 24 hour  Intake 440 ml  Output 1000 ml  Net -560 ml   Filed Weights   10/29/24 1214 10/30/24 1615 10/31/24 0400  Weight: 91.2 kg 87 kg 87.4 kg    Telemetry    Atrial flutter with intermittent pacing- Personally Reviewed  ECG     - Personally Reviewed  Physical Exam    General: Comfortable, Head: Atraumatic, normal size  Eyes: PEERLA, EOMI  Neck: Supple, normal JVD Cardiac: Normal S1, S2; RRR; no murmurs, rubs, or gallops Lungs: Clear to auscultation bilaterally Abd: Soft, nontender, no hepatomegaly  Ext: warm, bilateral +3 edema   Labs    Chemistry Recent Labs  Lab 10/25/24 1046 10/29/24 1340 10/30/24 0544  NA 144 146* 149*  K 4.7 3.7 3.6  CL 103 104 107  CO2 31 30 31    GLUCOSE 163* 130* 160*  BUN 33* 32* 32*  CREATININE 1.98* 1.68* 1.63*  CALCIUM  9.0 9.2 8.9  PROT  --   --  6.6  ALBUMIN   --   --  3.8  AST  --   --  23  ALT  --   --  20  ALKPHOS  --   --  69  BILITOT  --   --  0.5  GFRNONAA 24* 29* 30*  ANIONGAP 10 12 11      Hematology Recent Labs  Lab 10/25/24 1046 10/29/24 1340 10/30/24 0544  WBC 2.9* 3.7* 3.8*  RBC 3.22* 3.48* 3.30*  HGB 9.3* 10.0* 9.3*  HCT 30.8* 32.6* 30.9*  MCV 95.7 93.7 93.6  MCH 28.9 28.7 28.2  MCHC 30.2 30.7 30.1  RDW 15.9* 16.0* 15.9*  PLT 139* 143* 129*    Cardiac EnzymesNo results for input(s): TROPONINI in the last 168 hours. No results for input(s): TROPIPOC in the last 168 hours.   BNP Recent Labs  Lab 10/25/24 1046 10/29/24 1340  PROBNP 4,899.0* 7,389.0*     DDimer No results for input(s): DDIMER in the last 168 hours.   Radiology    ECHOCARDIOGRAM LIMITED Result Date: 10/30/2024  ECHOCARDIOGRAM LIMITED REPORT   Patient Name:   [name] Date of Exam: 10/30/2024 Medical Rec #:  [id number]    Height:       65.0 in Accession #:    [id number]   Weight:       201.0 lb Date of Birth:  1933/01/04    BSA:          1.982 m Patient Age:    89 years     BP:           173/69 mmHg Patient Gender: F            HR:           48 bpm. Exam Location:  Inpatient Procedure: Cardiac Doppler, Color Doppler, Intracardiac Opacification Agent and            Limited Echo (Both Spectral and Color Flow Doppler were utilized            during procedure). Indications:    CHF I50.21  History:        Patient has prior history of Echocardiogram examinations, most                 recent 09/04/2024. CHF, Pacemaker; Arrythmias:Atrial                 Fibrillation.  Sonographer:    Tinnie Gosling RDCS Referring Phys: [id number] SHENG L HALEY IMPRESSIONS  1. Left ventricular ejection fraction, by estimation, is 30 to 35%. The left ventricle has moderately decreased function. The left ventricle demonstrates global hypokinesis. The left  ventricular internal cavity size was mildly dilated. There is moderate  concentric left ventricular hypertrophy. Left ventricular diastolic parameters are indeterminate.  2. Right ventricular systolic function is moderately reduced.  3. Left atrial size was severely dilated.  4. Right atrial size was moderately dilated.  5. A small pericardial effusion is present. The pericardial effusion is posterior to the left ventricle. There is no evidence of cardiac tamponade.  6. The mitral valve is degenerative. Mild mitral valve regurgitation.  7. The aortic valve is tricuspid. There is mild calcification of the aortic valve. Aortic valve sclerosis/calcification is present, without any evidence of aortic stenosis.  8. The inferior vena cava is dilated in size with <50% respiratory variability, suggesting right atrial pressure of 15 mmHg. FINDINGS  Left Ventricle: Left ventricular ejection fraction, by estimation, is 30 to 35%. The left ventricle has moderately decreased function. The left ventricle demonstrates global hypokinesis. The left ventricular internal cavity size was mildly dilated. There is moderate concentric left ventricular hypertrophy. Left ventricular diastolic parameters are indeterminate. Right Ventricle: Right ventricular systolic function is moderately reduced. Left Atrium: Left atrial size was severely dilated. Right Atrium: Right atrial size was moderately dilated. Pericardium: A small pericardial effusion is present. The pericardial effusion is posterior to the left ventricle. There is no evidence of cardiac tamponade. Mitral Valve: The mitral valve is degenerative in appearance. Mild mitral valve regurgitation. Aortic Valve: The aortic valve is tricuspid. There is mild calcification of the aortic valve. Aortic valve sclerosis/calcification is present, without any evidence of aortic stenosis. Venous: The inferior vena cava is dilated in size with less than 50% respiratory variability, suggesting right  atrial pressure of 15 mmHg. IAS/Shunts: There is right bowing of the interatrial septum, suggestive of elevated left atrial pressure. Additional Comments: A device lead is visualized.  LEFT VENTRICLE PLAX 2D LVIDd:         4.70 cm LVIDs:  3.60 cm LV PW:         1.40 cm LV IVS:        1.40 cm LVOT diam:     2.00 cm LVOT Area:     3.14 cm  LV Volumes (MOD) LV vol d, MOD A4C: 146.0 ml LV vol s, MOD A4C: 95.7 ml LV SV MOD A4C:     146.0 ml IVC IVC diam: 2.90 cm  AORTA Ao Root diam: 3.00 cm MITRAL VALVE MV Area (PHT): 4.36 cm     SHUNTS MV Decel Time: 174 msec     Systemic Diam: 2.00 cm MV E velocity: 110.00 cm/s MV A velocity: 54.00 cm/s MV E/A ratio:  2.04 Toribio Fuel MD Electronically signed by Toribio Fuel MD Signature Date/Time: 10/30/2024/11:03:56 PM    Final    DG Chest 2 View Result Date: 10/29/2024 CLINICAL DATA:  Shortness of breath. EXAM: CHEST - 2 VIEW COMPARISON:  September 03, 2024 FINDINGS: There is stable dual lead AICD positioning. The cardiac silhouette is enlarged and unchanged in size. Mild perihilar prominence of the pulmonary vasculature is seen. Mild to moderate severity diffusely increased interstitial lung markings are present. Mild linear scarring and/or atelectasis is noted within the mid left lung and bilateral lung bases. No pleural effusion or pneumothorax is identified. There is scoliosis of the thoracic spine with multilevel degenerative changes. IMPRESSION: 1. Cardiomegaly with mild to moderate severity interstitial edema. 2. Mild mid left lung and bibasilar linear scarring and/or atelectasis. Electronically Signed   By: Suzen Dials M.D.   On: 10/29/2024 13:34    Cardiac Studies   Echo   Patient Profile     89 y.o. female admitted for acute exacerbation of heart failure  Assessment & Plan    Acute exacerbation of heart failure reduced ejection fraction Status post pacemaker implantation Permanent atrial flutter atrial fibrillation  Clinically she  appears to still be in acute exacerbation of heart failure needs to get more IV Lasix  we will continue this for now. She was seen by EP for evaluation for pacemaker upgrade.  Plan for continuation to optimize the patient with diuretics and hold off on pacemaker upgrade.  Reconsideration if refractory heart failure.  Blood pressure is slightly elevated will monitor if needed and still remains elevated will adjust antihypertensive medications tomorrow.  Holding carvedilol  for now.  Continue her dose of Eliquis  2.5 mg daily.  Will continue to follow with you     For questions or updates, please contact [facility] Please consult [email or web address] for contact info under Cardiology/STEMI.      Signed, Shellie Rogoff, DO  10/31/2024, 11:26 AM    "

## 2024-10-31 NOTE — Plan of Care (Signed)
   Problem: Clinical Measurements: Goal: Diagnostic test results will improve Outcome: Progressing Goal: Respiratory complications will improve Outcome: Progressing

## 2024-10-31 NOTE — Progress Notes (Signed)
 " Progress Note   Patient: [name] FMW:[id number] DOB: 1933-07-06 DOA: 10/29/2024     2 DOS: the patient was seen and examined on 10/31/2024   Brief hospital course: [name] was admitted to the hospital with the working diagnosis of heart failure   89 yo female nursing home resident with the past medical history of atrial fibrillation, SVT, heart failure, CKD, CVA, T2DM, hypertension, heart block sp pacemaker, who presents with dyspnea.  Reported 4 days of lower extremity edema, that progressed to dyspnea on exertion, despite increase in her oral diuretic therapy. She was evaluated by cardiology on the day of admission as outpatient, she was found volume overloaded and refer to the ED.  On her initial physical examination her blood pressure was 152/90, HR 102, RR 33 and 02 saturation was 89% Lungs with diffuse rales, heart with S1 and S2 present and irregular, abdomen soft not distended, and positive lower extremity edema +++  Na 146, K 3.7 Cl 104 bicarbonate 30 glucose 130 bun 32 cr 1,68  BNP 7,389  High sensitive troponin 68 and 66 Wbc 3,7 hgb 10.0 plt 143   Chest radiograph with hypoinflation, left rotation, positive cardiomegaly, with bilateral hilar vascular congestion, left pleural effusion.   EKG 64 bpm, left axis deviation, left bundle branch block, qtc 480, atrial flutter with variable block, intermittent ventricular pacing, with no significant ST segment or T wave changes.   Assessment and Plan: * Acute on chronic systolic CHF (congestive heart failure) (HCC) Limited echocardiogram with reduced LV systolic function 30 to 35%, global hypokinesis, mild dilatated LV cavity, moderate LVH, RV systolic function with moderate reduction, LA with severe dilatation and moderate dilatation RA, small pericardial effusion, mild mitral valve regurgitation   Urine output 1,350 ml Systolic blood pressure 150 to 160 mmHg.   Continue diuresis with furosemide  80 mg IV bid.  Continue afterload  reduction with hydralazine / isosorbide .  Hold on carvedilol  for now due to low EF.  Continue blood pressure monitoring.  Limited therapy due to reduced GFR.   Paroxysmal atrial fibrillation (HCC) Continue amiodarone  for rate control  She has pacemaker in place.  Continue anticoagulation with apixaban  Continue telemetry monitoring   HTN (hypertension) continue Bidil  and aggressive diuresis with IV furosemide  Continue blood pressure monitoring   Chronic kidney disease, stage 3b (HCC) Hypernatremia, Baseline serum cr is about 1.5 (per old records review)  01/07 Renal function with serum cr at 1,63 with K at 3,6 and serum bicarbonate at 31 Na 149 Mg 2,3   Plan to continue diuresis with IV furosemide  Follow up renal function and electrolytes in am.  Avoid hypotension or nephrotoxic medications   Type 2 diabetes mellitus with hyperlipidemia (HCC) Continue insulin  sliding scale for glucose cover and monitoring Continue basal insulin   Fasting glucose today 176 mg/dl   GERD (gastroesophageal reflux disease) Continue pantoprazole    hx of DVT Continue anticoagulation with apixaban    Malignant neoplasm of lower-inner quadrant of right breast of female, estrogen receptor positive (HCC) Follow as outpatient.   Obesity, class 1 Calculated BMI 33.4        Subjective: Patient is feeling better but not yet back to baseline, continue to have edema and dyspnea, poor mobility   Physical Exam: Vitals:   10/31/24 0400 10/31/24 0740 10/31/24 0742 10/31/24 1118  BP: (!) 123/58  (!) 140/71 (!) 158/72  Pulse: 69 (!) 58  62  Resp: 19 16  18   Temp: 98.4 F (36.9 C) 98.3 F (36.8 C)  98.4 F (36.9 C)  TempSrc: Oral Oral  Oral  SpO2: 100% 100%  94%  Weight: 87.4 kg     Height:       Neurology awake and alert ENT with mild pallor with no icterus Cardiovascular with S1 and S2 present and irregular with no gallops or rubs, positive systolic murmur at the apex Mild JVD Respiratory  with mild rales at bases with no wheezing or rhonchi, poor inspiratory effort Abdomen protuberant, soft and non tender, not distended Positive lower extremity edema + to ++  Data Reviewed:    Family Communication: no family at the bedside   Disposition: Status is: Inpatient Remains inpatient appropriate because: IV diuresis   Planned Discharge Destination: SNF      Author: Elidia Toribio Furnace, MD 10/31/2024 3:11 PM  For on call review [email or web address].  "

## 2024-10-31 NOTE — Progress Notes (Signed)
 Heart Failure Navigator Progress Note  Assessed for Heart & Vascular TOC clinic readiness.  Patient does not meet criteria due to per Dr. Arrien , no HF TOC will follow up with [facility]. .   Navigator will sign off at this time.   Stephane Haddock, BSN, Scientist, Clinical (histocompatibility And Immunogenetics) Only

## 2024-11-01 DIAGNOSIS — N1832 Chronic kidney disease, stage 3b: Secondary | ICD-10-CM | POA: Diagnosis not present

## 2024-11-01 DIAGNOSIS — I5023 Acute on chronic systolic (congestive) heart failure: Secondary | ICD-10-CM | POA: Diagnosis not present

## 2024-11-01 DIAGNOSIS — I480 Paroxysmal atrial fibrillation: Secondary | ICD-10-CM | POA: Diagnosis not present

## 2024-11-01 DIAGNOSIS — I10 Essential (primary) hypertension: Secondary | ICD-10-CM | POA: Diagnosis not present

## 2024-11-01 LAB — GLUCOSE, CAPILLARY
Glucose-Capillary: 166 mg/dL — ABNORMAL HIGH (ref 70–99)
Glucose-Capillary: 213 mg/dL — ABNORMAL HIGH (ref 70–99)
Glucose-Capillary: 252 mg/dL — ABNORMAL HIGH (ref 70–99)
Glucose-Capillary: 196 mg/dL — ABNORMAL HIGH (ref 70–99)

## 2024-11-01 LAB — BASIC METABOLIC PANEL WITH GFR
Anion gap: 8 (ref 5–15)
BUN: 27 mg/dL — ABNORMAL HIGH (ref 8–23)
CO2: 35 mmol/L — ABNORMAL HIGH (ref 22–32)
Calcium: 8.5 mg/dL — ABNORMAL LOW (ref 8.9–10.3)
Chloride: 104 mmol/L (ref 98–111)
Creatinine, Ser: 1.5 mg/dL — ABNORMAL HIGH (ref 0.44–1.00)
GFR, Estimated: 33 mL/min — ABNORMAL LOW
Glucose, Bld: 158 mg/dL — ABNORMAL HIGH (ref 70–99)
Potassium: 3.3 mmol/L — ABNORMAL LOW (ref 3.5–5.1)
Sodium: 147 mmol/L — ABNORMAL HIGH (ref 135–145)

## 2024-11-01 MED ORDER — POTASSIUM CHLORIDE CRYS ER 20 MEQ PO TBCR
40.0000 meq | EXTENDED_RELEASE_TABLET | Freq: Once | ORAL | Status: AC
  Administered 2024-11-01: 40 meq via ORAL
  Filled 2024-11-01: qty 2

## 2024-11-01 NOTE — Progress Notes (Addendum)
 " Progress Note   Patient: [name] FMW:[id number] DOB: 1933/04/15 DOA: 10/29/2024     3 DOS: the patient was seen and examined on 11/01/2024   Brief hospital course: [name] was admitted to the hospital with the working diagnosis of heart failure   89 yo female nursing home resident with the past medical history of atrial fibrillation, SVT, heart failure, CKD, CVA, T2DM, hypertension, heart block sp pacemaker, who presents with dyspnea.  Reported 4 days of lower extremity edema, that progressed to dyspnea on exertion, despite increase in her oral diuretic therapy. She was evaluated by cardiology on the day of admission as outpatient, she was found volume overloaded and refer to the ED.  On her initial physical examination her blood pressure was 152/90, HR 102, RR 33 and 02 saturation was 89% Lungs with diffuse rales, heart with S1 and S2 present and irregular, abdomen soft not distended, and positive lower extremity edema +++  Na 146, K 3.7 Cl 104 bicarbonate 30 glucose 130 bun 32 cr 1,68  BNP 7,389  High sensitive troponin 68 and 66 Wbc 3,7 hgb 10.0 plt 143   Chest radiograph with hypoinflation, left rotation, positive cardiomegaly, with bilateral hilar vascular congestion, left pleural effusion.   EKG 64 bpm, left axis deviation, left bundle branch block, qtc 480, atrial flutter with variable block, intermittent ventricular pacing, with no significant ST segment or T wave changes.   Patient placed on furosemide  for diuresis,   01/09 responding to diuresis.   Assessment and Plan: * Acute on chronic systolic CHF (congestive heart failure) (HCC) Limited echocardiogram with reduced LV systolic function 30 to 35%, global hypokinesis, mild dilatated LV cavity, moderate LVH, RV systolic function with moderate reduction, LA with severe dilatation and moderate dilatation RA, small pericardial effusion, mild mitral valve regurgitation   Urine output 950 ml (documented)  Systolic blood  pressure 150 to 160 mmHg.   Continue diuresis with furosemide  80 mg IV bid.  Continue afterload reduction with hydralazine / isosorbide .  Hold on carvedilol  for now due to low EF.  Continue blood pressure monitoring.  Limited therapy due to reduced GFR.   Paroxysmal atrial fibrillation (HCC) Continue amiodarone  for rate control  She has pacemaker in place.  Continue anticoagulation with apixaban  Continue telemetry monitoring   HTN (hypertension) continue Bidil  and aggressive diuresis with IV furosemide  Continue blood pressure monitoring   Chronic kidney disease, stage 3b (HCC) Hypernatremia, hypokalemia Baseline serum cr is about 1.5 (per old records review)  Renal function with serum cr at 1,50 with K at 3,3 and serum bicarbonate at 35 Na 147   Plan to continue diuresis with IV furosemide  Add one dose KCl 40 meq today  Follow up renal function and electrolytes in am.  Avoid hypotension or nephrotoxic medications   Type 2 diabetes mellitus with hyperlipidemia (HCC) Continue insulin  sliding scale for glucose cover and monitoring Continue basal insulin   Fasting glucose today 158 mg/dl   GERD (gastroesophageal reflux disease) Continue pantoprazole    hx of DVT Continue anticoagulation with apixaban    Malignant neoplasm of lower-inner quadrant of right breast of female, estrogen receptor positive (HCC) Follow as outpatient.   Obesity, class 1 Calculated BMI 33.4         Subjective: Patient with no chest pain, dyspnea and edema are improving, today with nausea   Physical Exam: Vitals:   11/01/24 0406 11/01/24 0727 11/01/24 1117 11/01/24 1618  BP: (!) 129/59 139/68 (!) 142/59   Pulse: 63 (!) 56  Resp: 19 16    Temp: 97.8 F (36.6 C) 98.1 F (36.7 C)  (!) 97.5 F (36.4 C)  TempSrc: Oral Oral Oral Oral  SpO2: 100% 100%    Weight: 87.8 kg     Height:       Neurology awake and alert, deconditioned ENT with mild pallor with no icterus Cardiovascular with S1  and S2 present and regular with no gallops or rubs, positive systolic murmur at the left lower sternal border Respiratory with mild rales at bases with poor inspiratory effort, positive kyphosis  Abdomen soft and non tender Positive lower extremity edema +  Data Reviewed:    Family Communication: no family at the bedside   Disposition: Status is: Inpatient Remains inpatient appropriate because: IV diuresis   Planned Discharge Destination: Home      Author: Elidia Toribio Furnace, MD 11/01/2024 4:59 PM  For on call review [email or web address].  "

## 2024-11-01 NOTE — Progress Notes (Addendum)
 "  Progress Note  Patient Name: [name] Date of Encounter: 11/01/2024  Primary Cardiologist: Oneil Parchment, MD   Subjective   Patient seen examined by her bedside. She was sleeping when I arrived and opens her eyes to voice  Inpatient Medications    Scheduled Meds:  apixaban   2.5 mg Oral BID   docusate sodium   100 mg Oral q morning   furosemide   80 mg Intravenous BID   insulin  aspart  0-5 Units Subcutaneous QHS   insulin  aspart  0-9 Units Subcutaneous TID WC   insulin  glargine-yfgn  6 Units Subcutaneous QHS   isosorbide -hydrALAZINE   1 tablet Oral TID   pantoprazole   40 mg Oral q morning   potassium chloride   40 mEq Oral Once   Continuous Infusions:  PRN Meds: acetaminophen  **OR** acetaminophen , ondansetron  **OR** ondansetron  (ZOFRAN ) IV   Vital Signs    Vitals:   10/31/24 1612 10/31/24 2003 11/01/24 0406 11/01/24 0727  BP: (!) 150/72 129/70 (!) 129/59 139/68  Pulse:  60 63 (!) 56  Resp:  19 19 16   Temp: 98.4 F (36.9 C) 98.2 F (36.8 C) 97.8 F (36.6 C) 98.1 F (36.7 C)  TempSrc: Oral Oral Oral Oral  SpO2:  100% 100% 100%  Weight:   87.8 kg   Height:        Intake/Output Summary (Last 24 hours) at 11/01/2024 0915 Last data filed at 11/01/2024 0408 Gross per 24 hour  Intake 480 ml  Output 600 ml  Net -120 ml   Filed Weights   10/30/24 1615 10/31/24 0400 11/01/24 0406  Weight: 87 kg 87.4 kg 87.8 kg    Telemetry    Atrial flutter with intermittent pacing- Personally Reviewed  ECG     - Personally Reviewed  Physical Exam    General: Comfortable, Head: Atraumatic, normal size  Eyes: PEERLA, EOMI  Neck: Supple, normal JVD Cardiac: Normal S1, S2; RRR; no murmurs, rubs, or gallops Lungs: Clear to auscultation bilaterally Abd: Soft, nontender, no hepatomegaly  Ext: warm, bilateral +3 edema   Labs    Chemistry Recent Labs  Lab 10/30/24 0544 10/31/24 1603 11/01/24 0259  NA 149* 144 147*  K 3.6 3.6 3.3*  CL 107 103 104  CO2 31 31 35*   GLUCOSE 160* 269* 158*  BUN 32* 27* 27*  CREATININE 1.63* 1.44* 1.50*  CALCIUM  8.9 8.8* 8.5*  PROT 6.6  --   --   ALBUMIN  3.8  --   --   AST 23  --   --   ALT 20  --   --   ALKPHOS 69  --   --   BILITOT 0.5  --   --   GFRNONAA 30* 35* 33*  ANIONGAP 11 11 8      Hematology Recent Labs  Lab 10/25/24 1046 10/29/24 1340 10/30/24 0544  WBC 2.9* 3.7* 3.8*  RBC 3.22* 3.48* 3.30*  HGB 9.3* 10.0* 9.3*  HCT 30.8* 32.6* 30.9*  MCV 95.7 93.7 93.6  MCH 28.9 28.7 28.2  MCHC 30.2 30.7 30.1  RDW 15.9* 16.0* 15.9*  PLT 139* 143* 129*    Cardiac EnzymesNo results for input(s): TROPONINI in the last 168 hours. No results for input(s): TROPIPOC in the last 168 hours.   BNP Recent Labs  Lab 10/25/24 1046 10/29/24 1340  PROBNP 4,899.0* 7,389.0*     DDimer No results for input(s): DDIMER in the last 168 hours.   Radiology    ECHOCARDIOGRAM LIMITED Result Date: 10/30/2024  ECHOCARDIOGRAM LIMITED REPORT   Patient Name:   [name] Date of Exam: 10/30/2024 Medical Rec #:  [id number]    Height:       65.0 in Accession #:    [id number]   Weight:       201.0 lb Date of Birth:  [birth year]    BSA:          1.982 m Patient Age:    89 years     BP:           173/69 mmHg Patient Gender: F            HR:           48 bpm. Exam Location:  Inpatient Procedure: Cardiac Doppler, Color Doppler, Intracardiac Opacification Agent and            Limited Echo (Both Spectral and Color Flow Doppler were utilized            during procedure). Indications:    CHF I50.21  History:        Patient has prior history of Echocardiogram examinations, most                 recent 09/04/2024. CHF, Pacemaker; Arrythmias:Atrial                 Fibrillation.  Sonographer:    Tinnie Gosling RDCS Referring Phys: [id number] SHENG L HALEY IMPRESSIONS  1. Left ventricular ejection fraction, by estimation, is 30 to 35%. The left ventricle has moderately decreased function. The left ventricle demonstrates global hypokinesis. The left  ventricular internal cavity size was mildly dilated. There is moderate  concentric left ventricular hypertrophy. Left ventricular diastolic parameters are indeterminate.  2. Right ventricular systolic function is moderately reduced.  3. Left atrial size was severely dilated.  4. Right atrial size was moderately dilated.  5. A small pericardial effusion is present. The pericardial effusion is posterior to the left ventricle. There is no evidence of cardiac tamponade.  6. The mitral valve is degenerative. Mild mitral valve regurgitation.  7. The aortic valve is tricuspid. There is mild calcification of the aortic valve. Aortic valve sclerosis/calcification is present, without any evidence of aortic stenosis.  8. The inferior vena cava is dilated in size with <50% respiratory variability, suggesting right atrial pressure of 15 mmHg. FINDINGS  Left Ventricle: Left ventricular ejection fraction, by estimation, is 30 to 35%. The left ventricle has moderately decreased function. The left ventricle demonstrates global hypokinesis. The left ventricular internal cavity size was mildly dilated. There is moderate concentric left ventricular hypertrophy. Left ventricular diastolic parameters are indeterminate. Right Ventricle: Right ventricular systolic function is moderately reduced. Left Atrium: Left atrial size was severely dilated. Right Atrium: Right atrial size was moderately dilated. Pericardium: A small pericardial effusion is present. The pericardial effusion is posterior to the left ventricle. There is no evidence of cardiac tamponade. Mitral Valve: The mitral valve is degenerative in appearance. Mild mitral valve regurgitation. Aortic Valve: The aortic valve is tricuspid. There is mild calcification of the aortic valve. Aortic valve sclerosis/calcification is present, without any evidence of aortic stenosis. Venous: The inferior vena cava is dilated in size with less than 50% respiratory variability, suggesting right  atrial pressure of 15 mmHg. IAS/Shunts: There is right bowing of the interatrial septum, suggestive of elevated left atrial pressure. Additional Comments: A device lead is visualized.  LEFT VENTRICLE PLAX 2D LVIDd:         4.70 cm LVIDs:  3.60 cm LV PW:         1.40 cm LV IVS:        1.40 cm LVOT diam:     2.00 cm LVOT Area:     3.14 cm  LV Volumes (MOD) LV vol d, MOD A4C: 146.0 ml LV vol s, MOD A4C: 95.7 ml LV SV MOD A4C:     146.0 ml IVC IVC diam: 2.90 cm  AORTA Ao Root diam: 3.00 cm MITRAL VALVE MV Area (PHT): 4.36 cm     SHUNTS MV Decel Time: 174 msec     Systemic Diam: 2.00 cm MV E velocity: 110.00 cm/s MV A velocity: 54.00 cm/s MV E/A ratio:  2.04 Toribio Fuel MD Electronically signed by Toribio Fuel MD Signature Date/Time: 10/30/2024/11:03:56 PM    Final     Cardiac Studies   Echo   Patient Profile     89 y.o. female admitted for acute exacerbation of heart failure  Assessment & Plan    Acute exacerbation of heart failure reduced ejection fraction Status post pacemaker implantation Permanent atrial flutter atrial fibrillation  Clinically she appears to still be in acute exacerbation of heart failure needs to get more IV Lasix  we will continue this for now.  She was seen by EP for evaluation for pacemaker upgrade.  Plan for continuation to optimize the patient with diuretics and hold off on pacemaker upgrade.  Reconsideration if refractory heart failure.  Blood pressure is at target this morning. No medication changes.  Continue her dose of Eliquis  2.5 mg daily.  Will continue to follow with you    For questions or updates, please contact [facility] Please consult [email or web address] for contact info under Cardiology/STEMI.      Signed, Thaddeaus Monica, DO  11/01/2024, 9:15 AM    "

## 2024-11-01 NOTE — Inpatient Diabetes Management (Signed)
 Inpatient Diabetes Program Recommendations  AACE/ADA: New Consensus Statement on Inpatient Glycemic Control (2015)  Target Ranges:  Prepandial:   less than 140 mg/dL      Peak postprandial:   less than 180 mg/dL (1-2 hours)      Critically ill patients:  140 - 180 mg/dL    Latest Reference Range & Units 10/31/24 06:26 10/31/24 11:28 10/31/24 16:15 10/31/24 21:36  Glucose-Capillary 70 - 99 mg/dL 823 (H)  2 units Novolog   179 (H)  2 units Novolog   252 (H)  5 units Novolog   212 (H)  2 units Novolog   6 units Semglee   (H): Data is abnormally high  Latest Reference Range & Units 11/01/24 06:32  Glucose-Capillary 70 - 99 mg/dL 833 (H)  2 units Novolog    (H): Data is abnormally high    Admit SOB/ Acute on chronic systolic CHF   History: DM, CKD, CVA  SNF DM Meds: Lantus  12 units AM/ 7 units PM      Humalog  2-10 units TID      FSL3 CGM  Current Orders: Semglee  6 units at HS    Novolog  0-9 units TID ac/hs    MD- Please consider:  1. Increase Semglee  slightly to 8 units at HS  2. Start Novolog  2 units TID with meals HOLD if pt NPO HOLD if pt eats <50% meals   --Will follow patient during hospitalization--  Adina Rudolpho Arrow RN, MSN, CDCES Diabetes Coordinator Inpatient Glycemic Control Team Team Pager: [phone] (8a-5p)

## 2024-11-01 NOTE — Plan of Care (Signed)
" °  Problem: Education: Goal: Ability to describe self-care measures that may prevent or decrease complications (Diabetes Survival Skills Education) will improve Outcome: Progressing   Problem: Coping: Goal: Ability to adjust to condition or change in health will improve Outcome: Progressing   Problem: Fluid Volume: Goal: Ability to maintain a balanced intake and output will improve Outcome: Progressing   Problem: Health Behavior/Discharge Planning: Goal: Ability to identify and utilize available resources and services will improve Outcome: Progressing   Problem: Metabolic: Goal: Ability to maintain appropriate glucose levels will improve Outcome: Progressing   Problem: Skin Integrity: Goal: Risk for impaired skin integrity will decrease Outcome: Progressing   Problem: Education: Goal: Knowledge of General Education information will improve Description: Including pain rating scale, medication(s)/side effects and non-pharmacologic comfort measures Outcome: Progressing   Problem: Health Behavior/Discharge Planning: Goal: Ability to manage health-related needs will improve Outcome: Progressing   Problem: Clinical Measurements: Goal: Ability to maintain clinical measurements within normal limits will improve Outcome: Progressing Goal: Will remain free from infection Outcome: Progressing Goal: Diagnostic test results will improve Outcome: Progressing   "

## 2024-11-02 DIAGNOSIS — I5023 Acute on chronic systolic (congestive) heart failure: Secondary | ICD-10-CM | POA: Diagnosis not present

## 2024-11-02 DIAGNOSIS — I447 Left bundle-branch block, unspecified: Secondary | ICD-10-CM

## 2024-11-02 DIAGNOSIS — I4891 Unspecified atrial fibrillation: Secondary | ICD-10-CM

## 2024-11-02 DIAGNOSIS — N1832 Chronic kidney disease, stage 3b: Secondary | ICD-10-CM | POA: Diagnosis not present

## 2024-11-02 DIAGNOSIS — Z950 Presence of cardiac pacemaker: Secondary | ICD-10-CM

## 2024-11-02 DIAGNOSIS — I10 Essential (primary) hypertension: Secondary | ICD-10-CM | POA: Diagnosis not present

## 2024-11-02 DIAGNOSIS — I480 Paroxysmal atrial fibrillation: Secondary | ICD-10-CM | POA: Diagnosis not present

## 2024-11-02 DIAGNOSIS — I5082 Biventricular heart failure: Secondary | ICD-10-CM

## 2024-11-02 LAB — GLUCOSE, CAPILLARY
Glucose-Capillary: 182 mg/dL — ABNORMAL HIGH (ref 70–99)
Glucose-Capillary: 254 mg/dL — ABNORMAL HIGH (ref 70–99)
Glucose-Capillary: 211 mg/dL — ABNORMAL HIGH (ref 70–99)

## 2024-11-02 LAB — BASIC METABOLIC PANEL WITH GFR
Anion gap: 9 (ref 5–15)
BUN: 25 mg/dL — ABNORMAL HIGH (ref 8–23)
CO2: 32 mmol/L (ref 22–32)
Calcium: 8.5 mg/dL — ABNORMAL LOW (ref 8.9–10.3)
Chloride: 105 mmol/L (ref 98–111)
Creatinine, Ser: 1.45 mg/dL — ABNORMAL HIGH (ref 0.44–1.00)
GFR, Estimated: 34 mL/min — ABNORMAL LOW
Glucose, Bld: 170 mg/dL — ABNORMAL HIGH (ref 70–99)
Potassium: 3.5 mmol/L (ref 3.5–5.1)
Sodium: 146 mmol/L — ABNORMAL HIGH (ref 135–145)

## 2024-11-02 LAB — MAGNESIUM: Magnesium: 2.1 mg/dL (ref 1.7–2.4)

## 2024-11-02 MED ORDER — POTASSIUM CHLORIDE CRYS ER 20 MEQ PO TBCR
40.0000 meq | EXTENDED_RELEASE_TABLET | Freq: Once | ORAL | Status: AC
  Administered 2024-11-02: 40 meq via ORAL
  Filled 2024-11-02: qty 2

## 2024-11-02 MED ORDER — METOLAZONE 2.5 MG PO TABS
2.5000 mg | ORAL_TABLET | Freq: Once | ORAL | Status: AC
  Administered 2024-11-02: 2.5 mg via ORAL
  Filled 2024-11-02: qty 1

## 2024-11-02 NOTE — Plan of Care (Signed)
  Problem: Coping: Goal: Ability to adjust to condition or change in health will improve Outcome: Progressing   Problem: Fluid Volume: Goal: Ability to maintain a balanced intake and output will improve Outcome: Progressing   Problem: Health Behavior/Discharge Planning: Goal: Ability to manage health-related needs will improve Outcome: Progressing   Problem: Nutritional: Goal: Maintenance of adequate nutrition will improve Outcome: Progressing   Problem: Tissue Perfusion: Goal: Adequacy of tissue perfusion will improve Outcome: Progressing   

## 2024-11-02 NOTE — Progress Notes (Addendum)
 " Progress Note   Patient: [name] FMW:[id number] DOB: 06/02/1933 DOA: 10/29/2024     4 DOS: the patient was seen and examined on 11/02/2024   Brief hospital course: Mrs. Verret was admitted to the hospital with the working diagnosis of heart failure   89 yo female nursing home resident with the past medical history of atrial fibrillation, SVT, heart failure, CKD, CVA, T2DM, hypertension, heart block sp pacemaker, who presents with dyspnea.  Reported 4 days of lower extremity edema, that progressed to dyspnea on exertion, despite increase in her oral diuretic therapy. She was evaluated by cardiology on the day of admission as outpatient, she was found volume overloaded and refer to the ED.  On her initial physical examination her blood pressure was 152/90, HR 102, RR 33 and 02 saturation was 89% Lungs with diffuse rales, heart with S1 and S2 present and irregular, abdomen soft not distended, and positive lower extremity edema +++  Na 146, K 3.7 Cl 104 bicarbonate 30 glucose 130 bun 32 cr 1,68  BNP 7,389  High sensitive troponin 68 and 66 Wbc 3,7 hgb 10.0 plt 143   Chest radiograph with hypoinflation, left rotation, positive cardiomegaly, with bilateral hilar vascular congestion, left pleural effusion.   EKG 64 bpm, left axis deviation, left bundle branch block, qtc 480, atrial flutter with variable block, intermittent ventricular pacing, with no significant ST segment or T wave changes.   Patient placed on furosemide  for diuresis,   01/09 responding to diuresis.  01/10 continue need for IV diuresis.   Assessment and Plan: * Acute on chronic systolic CHF (congestive heart failure) (HCC) Limited echocardiogram with reduced LV systolic function 30 to 35%, global hypokinesis, mild dilatated LV cavity, moderate LVH, RV systolic function with moderate reduction, LA with severe dilatation and moderate dilatation RA, small pericardial effusion, mild mitral valve regurgitation   Urine output  1200 ml (documented)  Systolic blood pressure 150 to 160 mmHg.   Continue diuresis with furosemide  80 mg IV bid.  Continue afterload reduction with hydralazine / isosorbide .  Hold on carvedilol  for now due to low EF.  Continue blood pressure monitoring.  Limited therapy due to reduced GFR.   Paroxysmal atrial fibrillation (HCC) Continue amiodarone  for rate control  She has pacemaker in place.  Continue anticoagulation with apixaban  Continue telemetry monitoring   HTN (hypertension) continue Bidil  and aggressive diuresis with IV furosemide  Continue blood pressure monitoring   Chronic kidney disease, stage 3b (HCC) Hypernatremia, hypokalemia Baseline serum cr is about 1.5 (per old records review)  Follow up renal function with serum cr at 1,45 with K at 3,5 and serum bicarbonate at 32 Na 146 Mg 2.1   Plan to continue diuresis with IV furosemide  Add one dose KCl 40 meq today  Follow up renal function and electrolytes in am.  Avoid hypotension or nephrotoxic medications   Type 2 diabetes mellitus with hyperlipidemia (HCC) Continue insulin  sliding scale for glucose cover and monitoring Continue basal insulin   Fasting glucose today 170 mg/dl   GERD (gastroesophageal reflux disease) Continue pantoprazole    hx of DVT Continue anticoagulation with apixaban    Malignant neoplasm of lower-inner quadrant of right breast of female, estrogen receptor positive (HCC) Follow as outpatient.   Obesity, class 1 Calculated BMI 33.4       Subjective: Patient with no chest pain, continue very weak and deconditioned, dyspnea and edema are improving, she has nausea and sensation of abdominal edema/ tightness   Physical Exam: Vitals:   11/01/24 2000 11/01/24  2305 11/02/24 0351 11/02/24 1100  BP:  133/61  (!) 178/80  Pulse:  (!) 58  67  Resp:  19  (!) 21  Temp:  98.4 F (36.9 C) 98 F (36.7 C) 98 F (36.7 C)  TempSrc:  Oral Oral Oral  SpO2: 100% 99%  95%  Weight:   88.1 kg    Height:       Neurology awake and alert, deconditioned ENT with mild pallor with no icterus Cardiovascular with S1 and S2 present and regular, positive systolic murmur at the left lower sternal border Positive JVD Respiratory with bilateral rales with no wheezing or rhonchi  Abdomen with no distention, soft and non tender Positive lower extremity edema ++  Data Reviewed:    Family Communication: I spoke with patient's granddaughter over the phone at the bedside, we talked in detail about patient's condition, plan of care and prognosis and all questions were addressed.   Disposition: Status is: Inpatient Remains inpatient appropriate because: IV diuresis   Planned Discharge Destination: Home     Author: Elidia Toribio Furnace, MD 11/02/2024 1:38 PM  For on call review [email or web address].  "

## 2024-11-02 NOTE — Progress Notes (Signed)
 Patient ID: [name], female   DOB: November 15, 1932, 89 y.o.   MRN: [id number] MD aware. Continue Lasix .  Verdie JONETTA Collier, RN

## 2024-11-02 NOTE — Progress Notes (Signed)
 "   Rounding Note    Patient Name: [name] Date of Encounter: 11/02/2024  [facility] Cardiologist: Oneil Parchment, MD  Subjective   Sitting up in chair.  No chest pain or shortness of breath Swelling improving  No family at bedside  Inpatient Medications    Scheduled Meds:  apixaban   2.5 mg Oral BID   docusate sodium   100 mg Oral q morning   furosemide   80 mg Intravenous BID   insulin  aspart  0-5 Units Subcutaneous QHS   insulin  aspart  0-9 Units Subcutaneous TID WC   insulin  glargine-yfgn  6 Units Subcutaneous QHS   isosorbide -hydrALAZINE   1 tablet Oral TID   metolazone   2.5 mg Oral Once   pantoprazole   40 mg Oral q morning   Continuous Infusions:  PRN Meds: acetaminophen  **OR** acetaminophen , ondansetron  **OR** ondansetron  (ZOFRAN ) IV   Vital Signs    Vitals:   11/01/24 2000 11/01/24 2305 11/02/24 0351 11/02/24 1100  BP:  133/61  (!) 178/80  Pulse:  (!) 58  67  Resp:  19  (!) 21  Temp:  98.4 F (36.9 C) 98 F (36.7 C) 98 F (36.7 C)  TempSrc:  Oral Oral Oral  SpO2: 100% 99%  95%  Weight:   88.1 kg   Height:        Intake/Output Summary (Last 24 hours) at 11/02/2024 1215 Last data filed at 11/01/2024 2306 Gross per 24 hour  Intake 240 ml  Output 1200 ml  Net -960 ml   Net IO Since Admission: -2,840 mL [11/02/24 1215]     11/02/2024    3:51 AM 11/01/2024    4:06 AM 10/31/2024    4:00 AM  Last 3 Weights  Weight (lbs) 194 lb 3.6 oz 193 lb 9 oz 192 lb 10.9 oz  Weight (kg) 88.1 kg 87.8 kg 87.4 kg      Telemetry    AFL cvr - Personally Reviewed  ECG    No new tracing - Personally Reviewed  Physical Exam   General: Older than stated age, hemodynamically stable, no acute distress HEENT: Normocephalic, atraumatic, moist membranes, JVP Lungs: Decreased breath sounds bilaterally, poor inspiratory effort Heart: Irregularly irregular, variable S1-S2, no murmurs rubs or gallops appreciated. Abdomen: Soft, nontender, nondistended, positive  bowel sounds Extremities: Warm to touch bilaterally, trace pitting edema  Labs    High Sensitivity Troponin:  No results for input(s): TROPONINIHS in the last 720 hours.   Chemistry Recent Labs  Lab 10/30/24 0544 10/31/24 1603 11/01/24 0259 11/02/24 0202  NA 149* 144 147* 146*  K 3.6 3.6 3.3* 3.5  CL 107 103 104 105  CO2 31 31 35* 32  GLUCOSE 160* 269* 158* 170*  BUN 32* 27* 27* 25*  CREATININE 1.63* 1.44* 1.50* 1.45*  CALCIUM  8.9 8.8* 8.5* 8.5*  MG 2.3 2.1  --  2.1  PROT 6.6  --   --   --   ALBUMIN  3.8  --   --   --   AST 23  --   --   --   ALT 20  --   --   --   ALKPHOS 69  --   --   --   BILITOT 0.5  --   --   --   GFRNONAA 30* 35* 33* 34*  ANIONGAP 11 11 8 9     Lipids No results for input(s): CHOL, TRIG, HDL, LABVLDL, LDLCALC, CHOLHDL in the last 168 hours.  Hematology Recent Labs  Lab 10/29/24 1340 10/30/24 0544  WBC 3.7* 3.8*  RBC 3.48* 3.30*  HGB 10.0* 9.3*  HCT 32.6* 30.9*  MCV 93.7 93.6  MCH 28.7 28.2  MCHC 30.7 30.1  RDW 16.0* 15.9*  PLT 143* 129*   Thyroid  No results for input(s): TSH, FREET4 in the last 168 hours.  BNP Recent Labs  Lab 10/29/24 1340  PROBNP 7,389.0*    DDimer No results for input(s): DDIMER in the last 168 hours.   Cardiac Studies   Echocardiography October 30 2024 1. Left ventricular ejection fraction, by estimation, is 30 to 35%. The left ventricle has moderately decreased function. The left ventricle demonstrates global hypokinesis. The left ventricular internal cavity size was mildly dilated. There is moderate concentric left ventricular hypertrophy. Left ventricular diastolic parameters are indeterminate. 2. Right ventricular systolic function is moderately reduced. 3. Left atrial size was severely dilated. 4. Right atrial size was moderately dilated. 5. A small pericardial effusion is present. The pericardial effusion is posterior to the left ventricle. There is no evidence of cardiac tamponade. 6. The  mitral valve is degenerative. Mild mitral valve regurgitation. 7. The aortic valve is tricuspid. There is mild calcification of the aortic valve. Aortic valve sclerosis/calcification is present, without any evidence of aortic stenosis. 8. The inferior vena cava is dilated in size with <50% respiratory variability, suggesting right atrial pressure of 15 mmHg.   Patient Profile     89 y.o. female permanent atrial fibrillation/flutter, sinus node dysfunction status post pacemaker implant June 2020, HFrEF, left bundle branch block, history of DVT, chronic kidney disease stage IIIb, chronic anemia and thrombocytopenia, diabetes.  8 good morning how are you good good  Assessment & Plan    Acute on chronic heart failure with reduced EF with biventricular heart failure physiology Left bundle branch block. Pacemaker in situ Failed outpatient diuretic therapy Net IO Since Admission: -2,840 mL [11/02/24 1215] Has been evaluated by EP during his hospitalization with regards to consideration for pacemaker upgrade.  Recommended medical therapy for now but if refractory to medical therapy device therapy could be considered. Currently on Lasix  IV push 80 mg twice daily. Continue BiDil  20/37.5 mg p.o. twice daily Will given one dose of zaroxolyn  2.5 mg po qday  Compression stockings to help mobilize fluids  GDMT limited due to renal function -  will focus on volume management now and reevaluate.  Recommend discussing with family goals of care.   Permanent atrial fibrillation/flutter Currently not on AV nodal blocking agents, ventricular rate well-controlled. On anticoagulation. Telemetry reviewed.  For questions or updates, please contact [facility] Please consult [email or web address] for contact info under     Signed, Madonna Michele HAS, [facility]  A Division of [facility] [address], [place], [place] [address]  11/02/2024, 12:15 PM     "

## 2024-11-03 DIAGNOSIS — I480 Paroxysmal atrial fibrillation: Secondary | ICD-10-CM | POA: Diagnosis not present

## 2024-11-03 DIAGNOSIS — N1832 Chronic kidney disease, stage 3b: Secondary | ICD-10-CM | POA: Diagnosis not present

## 2024-11-03 DIAGNOSIS — I10 Essential (primary) hypertension: Secondary | ICD-10-CM | POA: Diagnosis not present

## 2024-11-03 DIAGNOSIS — N183 Chronic kidney disease, stage 3 unspecified: Secondary | ICD-10-CM

## 2024-11-03 DIAGNOSIS — E785 Hyperlipidemia, unspecified: Secondary | ICD-10-CM | POA: Diagnosis not present

## 2024-11-03 DIAGNOSIS — I824Z1 Acute embolism and thrombosis of unspecified deep veins of right distal lower extremity: Secondary | ICD-10-CM | POA: Diagnosis not present

## 2024-11-03 DIAGNOSIS — K219 Gastro-esophageal reflux disease without esophagitis: Secondary | ICD-10-CM | POA: Diagnosis not present

## 2024-11-03 DIAGNOSIS — I5023 Acute on chronic systolic (congestive) heart failure: Secondary | ICD-10-CM | POA: Diagnosis not present

## 2024-11-03 DIAGNOSIS — E1169 Type 2 diabetes mellitus with other specified complication: Secondary | ICD-10-CM | POA: Diagnosis not present

## 2024-11-03 LAB — GLUCOSE, CAPILLARY
Glucose-Capillary: 163 mg/dL — ABNORMAL HIGH (ref 70–99)
Glucose-Capillary: 175 mg/dL — ABNORMAL HIGH (ref 70–99)
Glucose-Capillary: 209 mg/dL — ABNORMAL HIGH (ref 70–99)
Glucose-Capillary: 218 mg/dL — ABNORMAL HIGH (ref 70–99)
Glucose-Capillary: 332 mg/dL — ABNORMAL HIGH (ref 70–99)

## 2024-11-03 LAB — BASIC METABOLIC PANEL WITH GFR
Anion gap: 10 (ref 5–15)
BUN: 23 mg/dL (ref 8–23)
CO2: 34 mmol/L — ABNORMAL HIGH (ref 22–32)
Calcium: 8.9 mg/dL (ref 8.9–10.3)
Chloride: 101 mmol/L (ref 98–111)
Creatinine, Ser: 1.54 mg/dL — ABNORMAL HIGH (ref 0.44–1.00)
GFR, Estimated: 32 mL/min — ABNORMAL LOW
Glucose, Bld: 166 mg/dL — ABNORMAL HIGH (ref 70–99)
Potassium: 3.4 mmol/L — ABNORMAL LOW (ref 3.5–5.1)
Sodium: 144 mmol/L (ref 135–145)

## 2024-11-03 LAB — MAGNESIUM: Magnesium: 2.1 mg/dL (ref 1.7–2.4)

## 2024-11-03 MED ORDER — METOCLOPRAMIDE HCL 5 MG PO TABS
5.0000 mg | ORAL_TABLET | Freq: Three times a day (TID) | ORAL | Status: DC
  Administered 2024-11-03 – 2024-11-04 (×3): 5 mg via ORAL
  Filled 2024-11-03 (×3): qty 1

## 2024-11-03 MED ORDER — CARVEDILOL 6.25 MG PO TABS
6.2500 mg | ORAL_TABLET | Freq: Two times a day (BID) | ORAL | Status: DC
  Administered 2024-11-03 – 2024-11-04 (×2): 6.25 mg via ORAL
  Filled 2024-11-03 (×2): qty 1

## 2024-11-03 MED ORDER — POTASSIUM CHLORIDE CRYS ER 20 MEQ PO TBCR
40.0000 meq | EXTENDED_RELEASE_TABLET | Freq: Once | ORAL | Status: AC
  Administered 2024-11-03: 40 meq via ORAL
  Filled 2024-11-03: qty 2

## 2024-11-03 MED ORDER — SIMETHICONE 80 MG PO CHEW
80.0000 mg | CHEWABLE_TABLET | Freq: Four times a day (QID) | ORAL | Status: DC | PRN
  Administered 2024-11-03: 80 mg via ORAL
  Filled 2024-11-03: qty 1

## 2024-11-03 MED ORDER — TORSEMIDE 20 MG PO TABS
40.0000 mg | ORAL_TABLET | Freq: Every day | ORAL | Status: DC
  Administered 2024-11-03 – 2024-11-04 (×2): 40 mg via ORAL
  Filled 2024-11-03 (×2): qty 2

## 2024-11-03 NOTE — Plan of Care (Signed)
  Problem: Education: Goal: Knowledge of General Education information will improve Description: Including pain rating scale, medication(s)/side effects and non-pharmacologic comfort measures Outcome: Progressing   Problem: Clinical Measurements: Goal: Diagnostic test results will improve Outcome: Progressing   Problem: Activity: Goal: Risk for activity intolerance will decrease Outcome: Progressing   Problem: Nutrition: Goal: Adequate nutrition will be maintained Outcome: Progressing   Problem: Coping: Goal: Level of anxiety will decrease Outcome: Progressing   Problem: Safety: Goal: Ability to remain free from injury will improve Outcome: Progressing   Problem: Skin Integrity: Goal: Risk for impaired skin integrity will decrease Outcome: Progressing

## 2024-11-03 NOTE — Progress Notes (Signed)
 " Progress Note   Patient: [name] FMW:[id number] DOB: 12-10-1932 DOA: 10/29/2024     5 DOS: the patient was seen and examined on 11/03/2024   Brief hospital course: Mrs. Kintz was admitted to the hospital with the working diagnosis of heart failure   89 yo female nursing home resident, wheelchair bound, with the past medical history of atrial fibrillation, SVT, heart failure, CKD, CVA, T2DM, hypertension, heart block sp pacemaker, who presents with dyspnea.  Reported 4 days of lower extremity edema, that progressed to dyspnea on exertion, despite increase in her oral diuretic therapy. She was evaluated by cardiology on the day of admission as outpatient, she was found volume overloaded and refer to the ED.  On her initial physical examination her blood pressure was 152/90, HR 102, RR 33 and 02 saturation was 89% Lungs with diffuse rales, heart with S1 and S2 present and irregular, abdomen soft not distended, and positive lower extremity edema +++  Na 146, K 3.7 Cl 104 bicarbonate 30 glucose 130 bun 32 cr 1,68  BNP 7,389  High sensitive troponin 68 and 66 Wbc 3,7 hgb 10.0 plt 143   Chest radiograph with hypoinflation, left rotation, positive cardiomegaly, with bilateral hilar vascular congestion, left pleural effusion.   EKG 64 bpm, left axis deviation, left bundle branch block, qtc 480, atrial flutter with variable block, intermittent ventricular pacing, with no significant ST segment or T wave changes.   Patient placed on furosemide  for diuresis,   01/09 responding to diuresis.  01/10 continue need for IV diuresis.  01/11 continue diuresis   Assessment and Plan: * Acute on chronic systolic CHF (congestive heart failure) (HCC) Limited echocardiogram with reduced LV systolic function 30 to 35%, global hypokinesis, mild dilatated LV cavity, moderate LVH, RV systolic function with moderate reduction, LA with severe dilatation and moderate dilatation RA, small pericardial effusion, mild  mitral valve regurgitation   Urine output 2,700 ml  Systolic blood pressure 150 to 160 mmHg.   Continue afterload reduction with hydralazine / isosorbide .  Carvedilol  6.25 mg bid  Transitioned to torsemide  40 mg po daily.  Limited therapy due to reduced GFR.   Paroxysmal atrial fibrillation (HCC) Continue amiodarone  for rate control  She has pacemaker in place.  Continue anticoagulation with apixaban  Continue telemetry monitoring   HTN (hypertension) Continue hydralazine / isosorbide  and carvedilol  Loop diuretic with torsemide    Chronic kidney disease, stage 3b (HCC) Hypernatremia, hypokalemia Baseline serum cr is about 1.5 (per old records review)  Renal function with serum cr at 1,54 with K at 3,4 and serum bicarbonate at 34 Na 144 and Mg 2.1   Add one dose KCl 40 meq today  Follow up renal function and electrolytes in am.  Avoid hypotension or nephrotoxic medications  Transitioned to po torsemide    Type 2 diabetes mellitus with hyperlipidemia (HCC) Continue insulin  sliding scale for glucose cover and monitoring Continue basal insulin   Fasting glucose today 166 mg/dl   GERD (gastroesophageal reflux disease) Continue pantoprazole    hx of DVT Continue anticoagulation with apixaban    Malignant neoplasm of lower-inner quadrant of right breast of female, estrogen receptor positive (HCC) Follow as outpatient.   Obesity, class 1 Calculated BMI 33.4      Subjective: Patient with abdominal pain and nausea, poor oral intake, dyspnea and edema present with mild improvement   Physical Exam: Vitals:   11/02/24 2020 11/02/24 2320 11/03/24 0458 11/03/24 0833  BP: (!) 163/79 (!) 157/77 (!) 147/67 (!) 142/65  Pulse: 69 75 75 73  Resp: 19 18 16 20   Temp: 98.1 F (36.7 C) 98.9 F (37.2 C) 98.1 F (36.7 C) 97.9 F (36.6 C)  TempSrc: Oral Oral Oral Oral  SpO2: 97% 100% 98% 99%  Weight:   82.8 kg   Height:       Neurology awake and alert, deconditioned ENT with mild  pallor  Cardiovascular with S1 and S2 present, irregular, with no gallops or rubs, positive systolic murmur at the left lower sternal border Positive JVD Respiratory with rales at bases with no wheezing, poor inspiratory effort, anterior auscultation  Abdomen with no distention, soft and non tender Positive lower extremity edema ++  Data Reviewed:    Family Communication: no family at the bedside   Disposition: Status is: Inpatient Remains inpatient appropriate because: IV diuresis   Planned Discharge Destination: Skilled nursing facility     Author: Elidia Toribio Furnace, MD 11/03/2024 3:06 PM  For on call review [email or web address].  "

## 2024-11-03 NOTE — Plan of Care (Signed)
 Patient ID: [name], female   DOB: 1932/11/13, 89 y.o.   MRN: [id number]  Problem: Education: Goal: Ability to describe self-care measures that may prevent or decrease complications (Diabetes Survival Skills Education) will improve Outcome: Progressing Goal: Individualized Educational Video(s) Outcome: Progressing   Problem: Coping: Goal: Ability to adjust to condition or change in health will improve Outcome: Progressing   Problem: Fluid Volume: Goal: Ability to maintain a balanced intake and output will improve Outcome: Progressing   Problem: Health Behavior/Discharge Planning: Goal: Ability to identify and utilize available resources and services will improve Outcome: Progressing Goal: Ability to manage health-related needs will improve Outcome: Progressing   Problem: Metabolic: Goal: Ability to maintain appropriate glucose levels will improve Outcome: Progressing   Problem: Nutritional: Goal: Maintenance of adequate nutrition will improve Outcome: Progressing Goal: Progress toward achieving an optimal weight will improve Outcome: Progressing   Problem: Skin Integrity: Goal: Risk for impaired skin integrity will decrease Outcome: Progressing   Problem: Tissue Perfusion: Goal: Adequacy of tissue perfusion will improve Outcome: Progressing   Problem: Education: Goal: Knowledge of General Education information will improve Description: Including pain rating scale, medication(s)/side effects and non-pharmacologic comfort measures Outcome: Progressing   Problem: Health Behavior/Discharge Planning: Goal: Ability to manage health-related needs will improve Outcome: Progressing   Problem: Clinical Measurements: Goal: Ability to maintain clinical measurements within normal limits will improve Outcome: Progressing Goal: Will remain free from infection Outcome: Progressing Goal: Diagnostic test results will improve Outcome: Progressing Goal: Respiratory  complications will improve Outcome: Progressing Goal: Cardiovascular complication will be avoided Outcome: Progressing   Problem: Activity: Goal: Risk for activity intolerance will decrease Outcome: Progressing   Problem: Nutrition: Goal: Adequate nutrition will be maintained Outcome: Progressing   Problem: Coping: Goal: Level of anxiety will decrease Outcome: Progressing   Problem: Elimination: Goal: Will not experience complications related to bowel motility Outcome: Progressing Goal: Will not experience complications related to urinary retention Outcome: Progressing   Problem: Pain Managment: Goal: General experience of comfort will improve and/or be controlled Outcome: Progressing   Problem: Safety: Goal: Ability to remain free from injury will improve Outcome: Progressing   Problem: Skin Integrity: Goal: Risk for impaired skin integrity will decrease Outcome: Progressing    Stephanie JONETTA Collier, RN

## 2024-11-03 NOTE — Progress Notes (Signed)
 "   Rounding Note    Patient Name: [name] Date of Encounter: 11/03/2024   [facility] Cardiologist: Oneil Parchment, MD  Subjective   No events overnight. Less short of breath and significantly pleased with the degree of diuresis   Inpatient Medications    Scheduled Meds:  apixaban   2.5 mg Oral BID   carvedilol   6.25 mg Oral BID WC   docusate sodium   100 mg Oral q morning   insulin  aspart  0-5 Units Subcutaneous QHS   insulin  aspart  0-9 Units Subcutaneous TID WC   insulin  glargine-yfgn  6 Units Subcutaneous QHS   isosorbide -hydrALAZINE   1 tablet Oral TID   pantoprazole   40 mg Oral q morning   torsemide   40 mg Oral Daily   Continuous Infusions:  PRN Meds: acetaminophen  **OR** acetaminophen , ondansetron  **OR** ondansetron  (ZOFRAN ) IV   Vital Signs    Vitals:   11/02/24 2020 11/02/24 2320 11/03/24 0458 11/03/24 0833  BP: (!) 163/79 (!) 157/77 (!) 147/67 (!) 142/65  Pulse: 69 75 75 73  Resp: 19 18 16 20   Temp: 98.1 F (36.7 C) 98.9 F (37.2 C) 98.1 F (36.7 C) 97.9 F (36.6 C)  TempSrc: Oral Oral Oral Oral  SpO2: 97% 100% 98% 99%  Weight:   82.8 kg   Height:        Intake/Output Summary (Last 24 hours) at 11/03/2024 1106 Last data filed at 11/03/2024 9161 Gross per 24 hour  Intake 480 ml  Output 3500 ml  Net -3020 ml   Net IO Since Admission: -5,620 mL [11/03/24 1106]     11/03/2024    4:58 AM 11/02/2024    3:51 AM 11/01/2024    4:06 AM  Last 3 Weights  Weight (lbs) 182 lb 8.7 oz 194 lb 3.6 oz 193 lb 9 oz  Weight (kg) 82.8 kg 88.1 kg 87.8 kg      Telemetry    AFL cvr - Personally Reviewed  ECG    No new tracing - Personally Reviewed  Physical Exam   General: Older than stated age, hemodynamically stable, no acute distress HEENT: Normocephalic, atraumatic, moist membranes, JVP (improving) Lungs: Decreased breath sounds bilaterally, poor inspiratory effort Heart: Irregularly irregular, variable S1-S2, no murmurs rubs or gallops  appreciated. Abdomen: Soft, nontender, nondistended, positive bowel sounds Extremities: Warm to touch bilaterally, trace pitting edema  Labs    High Sensitivity Troponin:  No results for input(s): TROPONINIHS in the last 720 hours.   Chemistry Recent Labs  Lab 10/30/24 0544 10/31/24 1603 11/01/24 0259 11/02/24 0202 11/03/24 0232  NA 149* 144 147* 146* 144  K 3.6 3.6 3.3* 3.5 3.4*  CL 107 103 104 105 101  CO2 31 31 35* 32 34*  GLUCOSE 160* 269* 158* 170* 166*  BUN 32* 27* 27* 25* 23  CREATININE 1.63* 1.44* 1.50* 1.45* 1.54*  CALCIUM  8.9 8.8* 8.5* 8.5* 8.9  MG 2.3 2.1  --  2.1 2.1  PROT 6.6  --   --   --   --   ALBUMIN  3.8  --   --   --   --   AST 23  --   --   --   --   ALT 20  --   --   --   --   ALKPHOS 69  --   --   --   --   BILITOT 0.5  --   --   --   --   GFRNONAA 30* 35*  33* 34* 32*  ANIONGAP 11 11 8 9 10     Lipids No results for input(s): CHOL, TRIG, HDL, LABVLDL, LDLCALC, CHOLHDL in the last 168 hours.  Hematology Recent Labs  Lab 10/29/24 1340 10/30/24 0544  WBC 3.7* 3.8*  RBC 3.48* 3.30*  HGB 10.0* 9.3*  HCT 32.6* 30.9*  MCV 93.7 93.6  MCH 28.7 28.2  MCHC 30.7 30.1  RDW 16.0* 15.9*  PLT 143* 129*   Thyroid  No results for input(s): TSH, FREET4 in the last 168 hours.  BNP Recent Labs  Lab 10/29/24 1340  PROBNP 7,389.0*    DDimer No results for input(s): DDIMER in the last 168 hours.   Cardiac Studies   Echocardiography October 30 2024 1. Left ventricular ejection fraction, by estimation, is 30 to 35%. The left ventricle has moderately decreased function. The left ventricle demonstrates global hypokinesis. The left ventricular internal cavity size was mildly dilated. There is moderate concentric left ventricular hypertrophy. Left ventricular diastolic parameters are indeterminate. 2. Right ventricular systolic function is moderately reduced. 3. Left atrial size was severely dilated. 4. Right atrial size was moderately dilated. 5.  A small pericardial effusion is present. The pericardial effusion is posterior to the left ventricle. There is no evidence of cardiac tamponade. 6. The mitral valve is degenerative. Mild mitral valve regurgitation. 7. The aortic valve is tricuspid. There is mild calcification of the aortic valve. Aortic valve sclerosis/calcification is present, without any evidence of aortic stenosis. 8. The inferior vena cava is dilated in size with <50% respiratory variability, suggesting right atrial pressure of 15 mmHg.   Patient Profile     89 y.o. female permanent atrial fibrillation/flutter, sinus node dysfunction status post pacemaker implant June 2020, HFrEF, left bundle branch block, history of DVT, chronic kidney disease stage IIIb, chronic anemia and thrombocytopenia, diabetes.  8 good morning how are you good good  Assessment & Plan    Acute on chronic heart failure with reduced EF with biventricular heart failure physiology Left bundle branch block. Pacemaker in situ Failed outpatient diuretic therapy Net IO Since Admission: -5,620 mL [11/03/24 1106] Has been evaluated by EP during his hospitalization with regards to consideration for pacemaker upgrade.  Recommended medical therapy for now but if refractory to medical therapy device therapy could be considered. Currently on Lasix  IV push 80 mg twice daily.  Will decrease to torsemide  40 mg p.o. daily Continue BiDil  20/37.5 mg p.o. twice daily Restart home dose carvedilol  6.25 mg p.o. twice daily GDMT limited due to renal function -  will focus on volume management now and reevaluate.  Compression stockings to help mobilize fluids  Recommend discussing with family goals of care.   Permanent atrial fibrillation/flutter Ventricular rates are well-controlled Restart carvedilol  as discussed above. Continue Eliquis  for thromboembolic prophylaxis Telemetry reviewed.  For questions or updates, please contact [facility] Please consult  [email or web address] for contact info under     Signed, Madonna Michele HAS, [facility]  A Division of [facility] [address], [place], [place] [address]  11/03/2024, 11:06 AM     "

## 2024-11-04 DIAGNOSIS — I824Z1 Acute embolism and thrombosis of unspecified deep veins of right distal lower extremity: Secondary | ICD-10-CM | POA: Diagnosis not present

## 2024-11-04 DIAGNOSIS — I129 Hypertensive chronic kidney disease with stage 1 through stage 4 chronic kidney disease, or unspecified chronic kidney disease: Secondary | ICD-10-CM

## 2024-11-04 DIAGNOSIS — I5082 Biventricular heart failure: Secondary | ICD-10-CM

## 2024-11-04 DIAGNOSIS — I4892 Unspecified atrial flutter: Secondary | ICD-10-CM

## 2024-11-04 DIAGNOSIS — I10 Essential (primary) hypertension: Secondary | ICD-10-CM | POA: Diagnosis not present

## 2024-11-04 DIAGNOSIS — I4891 Unspecified atrial fibrillation: Secondary | ICD-10-CM

## 2024-11-04 DIAGNOSIS — I5023 Acute on chronic systolic (congestive) heart failure: Secondary | ICD-10-CM | POA: Diagnosis not present

## 2024-11-04 DIAGNOSIS — E66811 Obesity, class 1: Secondary | ICD-10-CM | POA: Diagnosis not present

## 2024-11-04 DIAGNOSIS — E1169 Type 2 diabetes mellitus with other specified complication: Secondary | ICD-10-CM | POA: Diagnosis not present

## 2024-11-04 DIAGNOSIS — C50311 Malignant neoplasm of lower-inner quadrant of right female breast: Secondary | ICD-10-CM | POA: Diagnosis not present

## 2024-11-04 DIAGNOSIS — I484 Atypical atrial flutter: Secondary | ICD-10-CM

## 2024-11-04 DIAGNOSIS — N1832 Chronic kidney disease, stage 3b: Secondary | ICD-10-CM | POA: Diagnosis not present

## 2024-11-04 DIAGNOSIS — K219 Gastro-esophageal reflux disease without esophagitis: Secondary | ICD-10-CM | POA: Diagnosis not present

## 2024-11-04 DIAGNOSIS — I480 Paroxysmal atrial fibrillation: Secondary | ICD-10-CM | POA: Diagnosis not present

## 2024-11-04 DIAGNOSIS — N184 Chronic kidney disease, stage 4 (severe): Secondary | ICD-10-CM

## 2024-11-04 DIAGNOSIS — N183 Chronic kidney disease, stage 3 unspecified: Secondary | ICD-10-CM

## 2024-11-04 LAB — GLUCOSE, CAPILLARY
Glucose-Capillary: 196 mg/dL — ABNORMAL HIGH (ref 70–99)
Glucose-Capillary: 281 mg/dL — ABNORMAL HIGH (ref 70–99)

## 2024-11-04 LAB — RENAL FUNCTION PANEL
Albumin: 3.5 g/dL (ref 3.5–5.0)
Anion gap: 11 (ref 5–15)
BUN: 24 mg/dL — ABNORMAL HIGH (ref 8–23)
CO2: 33 mmol/L — ABNORMAL HIGH (ref 22–32)
Calcium: 9.1 mg/dL (ref 8.9–10.3)
Chloride: 98 mmol/L (ref 98–111)
Creatinine, Ser: 1.72 mg/dL — ABNORMAL HIGH (ref 0.44–1.00)
GFR, Estimated: 28 mL/min — ABNORMAL LOW
Glucose, Bld: 193 mg/dL — ABNORMAL HIGH (ref 70–99)
Phosphorus: 4.1 mg/dL (ref 2.5–4.6)
Potassium: 3.5 mmol/L (ref 3.5–5.1)
Sodium: 142 mmol/L (ref 135–145)

## 2024-11-04 MED ORDER — ENSURE PLUS HIGH PROTEIN PO LIQD: 237.0000 mL | Freq: Every day | ORAL | 0 refills | Status: AC

## 2024-11-04 MED ORDER — INSULIN ASPART 100 UNIT/ML IJ SOLN
2.0000 [IU] | Freq: Three times a day (TID) | INTRAMUSCULAR | Status: DC
  Administered 2024-11-04: 2 [IU] via SUBCUTANEOUS
  Filled 2024-11-04: qty 2

## 2024-11-04 MED ORDER — ENSURE PLUS HIGH PROTEIN PO LIQD: 237.0000 mL | Freq: Every day | ORAL | Status: DC

## 2024-11-04 MED ORDER — INSULIN GLARGINE-YFGN 100 UNIT/ML ~~LOC~~ SOLN: 8.0000 [IU] | Freq: Every day | SUBCUTANEOUS | Status: DC

## 2024-11-04 MED ORDER — TORSEMIDE 40 MG PO TABS: 40.0000 mg | ORAL_TABLET | Freq: Every day | ORAL | 0 refills | Status: AC

## 2024-11-04 MED ORDER — INSULIN GLARGINE 100 UNIT/ML ~~LOC~~ SOLN
8.0000 [IU] | Freq: Every day | SUBCUTANEOUS | Status: DC
  Filled 2024-11-04: qty 0.08

## 2024-11-04 MED ORDER — POTASSIUM CHLORIDE CRYS ER 20 MEQ PO TBCR
20.0000 meq | EXTENDED_RELEASE_TABLET | Freq: Once | ORAL | Status: AC
  Administered 2024-11-04: 20 meq via ORAL
  Filled 2024-11-04: qty 1

## 2024-11-04 MED ORDER — METOCLOPRAMIDE HCL 5 MG PO TABS: 5.0000 mg | ORAL_TABLET | Freq: Three times a day (TID) | ORAL | 0 refills | Status: AC | PRN

## 2024-11-04 MED ORDER — ISOSORB DINITRATE-HYDRALAZINE 20-37.5 MG PO TABS: 1.0000 | ORAL_TABLET | Freq: Three times a day (TID) | ORAL | 0 refills | Status: AC

## 2024-11-04 MED ORDER — SIMETHICONE 80 MG PO CHEW: 80.0000 mg | CHEWABLE_TABLET | Freq: Four times a day (QID) | ORAL | 0 refills | Status: AC

## 2024-11-04 NOTE — TOC Transition Note (Signed)
 Transition of Care [facility]) - Discharge Note   Patient Details  Name: [name] MRN: [id number] Date of Birth: Oct 26, 1932  [facility] CM/SW Contact:  [name], LCSWA Phone Number: 11/04/2024, 11:40 AM   Clinical Narrative:   Patient will DC to: [name] Anticipated DC date: 11/04/2024  Family notified: [facility] Daughter, Emergency Contact [phone]   Transport by: [facility]   Per MD patient ready for DC to . RN to call report prior to discharge [phone]; room [facility]b [facility] ). RN, patient, patient's family, and facility notified of DC. Discharge Summary and FL2 sent to facility. DC packet on chart. Ambulance transport requested for patient at 11:39 AM.   CSW will sign off for now as social work intervention is no longer needed. Please consult us  again if new needs arise.      Final next level of care: Skilled Nursing Facility Barriers to Discharge: Barriers Resolved   Patient Goals and CMS Choice Patient states their goals for this hospitalization and ongoing recovery are:: To return to ltc   Choice offered to / list presented to : NA      Discharge Placement              [facility] bed at: [name] Patient to be transferred to facility by: [facility] Name of family member notified: Small,[name]  Daughter, Emergency Contact  [phone] Patient and family notified of of transfer: 11/04/24  Discharge Plan and Services Additional resources added to the After Visit Summary for   In-house Referral: Clinical Social Work   Post Acute Care Choice: Skilled Nursing Facility                               Social Drivers of Health (SDOH) Interventions SDOH Screenings   Food Insecurity: No Food Insecurity (10/30/2024)  Housing: Low Risk (10/30/2024)  Recent Concern: Housing - High Risk (09/04/2024)  Transportation Needs: No Transportation Needs (10/30/2024)  Utilities: Not At Risk (10/30/2024)  Alcohol Screen: Low Risk  (09/05/2024)  Financial Resource Strain: Low Risk (09/05/2024)  Social Connections: Moderately Isolated (10/30/2024)  Tobacco Use: Low Risk (10/29/2024)     Readmission Risk Interventions     No data to display

## 2024-11-04 NOTE — Plan of Care (Signed)
   Problem: Coping: Goal: Ability to adjust to condition or change in health will improve Outcome: Progressing   Problem: Fluid Volume: Goal: Ability to maintain a balanced intake and output will improve Outcome: Progressing

## 2024-11-04 NOTE — Progress Notes (Addendum)
 " Progress Note  Patient Name: [name] Date of Encounter: 11/04/2024  Primary Cardiologist: Was previously assigned to Dr. Jeffrie upon Dr. Johnice departure, but he has never seen patient. Now primary patient of Dr. Michele.  Subjective   Feeling well, no complaints. Edema remains improved. Breathing stable. No CP. Feels good to get out of bed up to chair.  Inpatient Medications    Scheduled Meds:  apixaban   2.5 mg Oral BID   carvedilol   6.25 mg Oral BID WC   docusate sodium   100 mg Oral q morning   insulin  aspart  0-5 Units Subcutaneous QHS   insulin  aspart  0-9 Units Subcutaneous TID WC   insulin  glargine-yfgn  6 Units Subcutaneous QHS   isosorbide -hydrALAZINE   1 tablet Oral TID   metoCLOPramide   5 mg Oral TID AC   pantoprazole   40 mg Oral q morning   potassium chloride   20 mEq Oral Once   torsemide   40 mg Oral Daily   Continuous Infusions:  PRN Meds: acetaminophen  **OR** acetaminophen , ondansetron  **OR** ondansetron  (ZOFRAN ) IV, simethicone    Vital Signs    Vitals:   11/04/24 0117 11/04/24 0415 11/04/24 0500 11/04/24 0748  BP: 137/66 129/61  (!) 105/56  Pulse: 71 69  65  Resp: 17 19  18   Temp: 97.6 F (36.4 C) 98 F (36.7 C)  98.5 F (36.9 C)  TempSrc: Oral Oral  Oral  SpO2: 100% 100%  98%  Weight:   79.6 kg   Height:        Intake/Output Summary (Last 24 hours) at 11/04/2024 0859 Last data filed at 11/04/2024 0837 Gross per 24 hour  Intake 300 ml  Output 400 ml  Net -100 ml      11/04/2024    5:00 AM 11/03/2024    4:58 AM 11/02/2024    3:51 AM  Last 3 Weights  Weight (lbs) 175 lb 7.8 oz 182 lb 8.7 oz 194 lb 3.6 oz  Weight (kg) 79.6 kg 82.8 kg 88.1 kg     Telemetry    Atrial flutter with CVR - Personally Reviewed  Physical Exam   GEN: No acute distress.  HEENT: Normocephalic, atraumatic, sclera non-icteric. Neck: + JVD, no bruits. Cardiac: Irregular, rate controlled, no murmurs, rubs, or gallops.  Respiratory: Decreased BS bilaterally  without wheezing/rhonchi/rales. Breathing is unlabored. GI: Soft, nontender, non-distended, BS +x 4. MS: no deformity. Extremities: No clubbing or cyanosis. Bilateral woody edema with striations present indicative of previous more significant swelling. Distal pedal pulses are 2+ and equal bilaterally. Neuro:  AAOx3. Follows commands. Psych:  Responds to questions appropriately with a normal affect.  Labs    High Sensitivity Troponin:  No results for input(s): TROPONINIHS in the last 720 hours.    Cardiac EnzymesNo results for input(s): TROPONINI in the last 168 hours. No results for input(s): TROPIPOC in the last 168 hours.   Chemistry Recent Labs  Lab 10/30/24 0544 10/31/24 1603 11/02/24 0202 11/03/24 0232 11/04/24 0154  NA 149*   < > 146* 144 142  K 3.6   < > 3.5 3.4* 3.5  CL 107   < > 105 101 98  CO2 31   < > 32 34* 33*  GLUCOSE 160*   < > 170* 166* 193*  BUN 32*   < > 25* 23 24*  CREATININE 1.63*   < > 1.45* 1.54* 1.72*  CALCIUM  8.9   < > 8.5* 8.9 9.1  PROT 6.6  --   --   --   --  ALBUMIN  3.8  --   --   --  3.5  AST 23  --   --   --   --   ALT 20  --   --   --   --   ALKPHOS 69  --   --   --   --   BILITOT 0.5  --   --   --   --   GFRNONAA 30*   < > 34* 32* 28*  ANIONGAP 11   < > 9 10 11    < > = values in this interval not displayed.     Hematology Recent Labs  Lab 10/29/24 1340 10/30/24 0544  WBC 3.7* 3.8*  RBC 3.48* 3.30*  HGB 10.0* 9.3*  HCT 32.6* 30.9*  MCV 93.7 93.6  MCH 28.7 28.2  MCHC 30.7 30.1  RDW 16.0* 15.9*  PLT 143* 129*    BNP Recent Labs  Lab 10/29/24 1340  PROBNP 7,389.0*     DDimer No results for input(s): DDIMER in the last 168 hours.   Radiology    No results found.  Cardiac Studies   Echo 10/30/24 1. Left ventricular ejection fraction, by estimation, is 30 to 35%. The  left ventricle has moderately decreased function. The left ventricle  demonstrates global hypokinesis. The left ventricular internal cavity size   was mildly dilated. There is moderate   concentric left ventricular hypertrophy. Left ventricular diastolic  parameters are indeterminate.   2. Right ventricular systolic function is moderately reduced.   3. Left atrial size was severely dilated.   4. Right atrial size was moderately dilated.   5. A small pericardial effusion is present. The pericardial effusion is  posterior to the left ventricle. There is no evidence of cardiac  tamponade.   6. The mitral valve is degenerative. Mild mitral valve regurgitation.   7. The aortic valve is tricuspid. There is mild calcification of the  aortic valve. Aortic valve sclerosis/calcification is present, without any  evidence of aortic stenosis.   8. The inferior vena cava is dilated in size with <50% respiratory  variability, suggesting right atrial pressure of 15 mmHg.   Patient Profile     89 y.o. female with permanent atrial fibrillation/flutter, sinus node dysfunction s/p BTK PPM 03/2019, chronic HFrEF, left bundle branch block, SVT, history of DVT, CVA, breast CA, chronic kidney disease stage IIIb, chronic anemia and thrombocytopenia, diabetes, mostly wheelchair bound. Over the years has had variable LVEF - last normal in 2018 with variability from 30-50% since then. Stress test 2019 showed fixed inferoseptal defect c/w LBBB vs prior infarct; no ischemia; EF 50%. Multiple admissions with heart failure noted, GDMT limited by CKD, previously developed AKI with overdiuresis in 08/2024. Did not tolerate Unna boots. Has required SNF. Seen in office 1/6, recommended to go to ED; required BiPAP in ER with pulm edema on CXR.  Assessment & Plan    1. Acute on chronic HFrEF (biventricular) - etiology of cardiomyopathy not clear but presumed NICM - felt poor candidate for invasive ischemic evaluation with CKD, use of cMRI dependent on PPM capabilities - AHF team recommended to start with PYP testing at cardiology f/u. They note  SPEP and multiple myeloma  labs unremarkble, and with no M spike. PYP ordering can be revisited at outpatient follow-up - diuresed -5.7L net with weight 191.8->175.49 today - ARB/MRA deferred with variable renal function, SGLT2i deferred due to frailty and limited mobility - current GDMT: carvedilol  6.25mg  BID (started 1/11 PM), Bidil   20/37.5mg  TID, torsemide  40mg  daily (transitioned to oral 1/11) + KCl 20meq today - will review med plan with MD  2. Permanent atrial fibrillation/flutter - amiodarone  stopped this admission given permanent atrial fibrillation- tele c/w AFL with controlled rate - remains on Eliquis  2.5mg  BID, dose appropriate for age/Cr  3. CKD 3b - recent baseline extremely variable in setting of admissions, ranging from 1.4 to 2.7 - slight uptrend today, - will review med plan with MD - would benefit from OP nephrology f/u  4. Bradycardia/CHB s/p PPM - EP saw patient with regards to LVEF/PPM implant, see consult note - per Dr. Almetta, decline in LVEF felt likely 2/2 NICM with conduction disease. Per his note, Her functional status is extremely limited. I will reach out to her family and discuss further but after talking with her, she and I both felt that the risk vs benefit of considering device upgrade to CRTP did not make sense with her multiple co morbidities and severe functional limitations. If able to optimize her diuretics then I think that would be the best scenario. If she has refractory HF sx despite diuresis then we can discuss device upgrade further but for now would try to avoid as a last resort for refractory HF sx. Device reprogrammed to allow for intrinsic conduction (acknowledging that this is likely not much different than her LBBB at baseline on recent review of her ECGs).  5. Elevated troponin - low/flat at 68,66, felt due to demand ischemia  6. Pancytopenia - per primary team  7. Small pericardial effusion, mild MR - follow clinically, no tamponade  Have tentatively arranged  f/u 11/12/24 with LOIS Shams on a day when Dr. Michele is in the office.   For questions or updates, please contact [facility] Please consult [email or web address] for contact info under Cardiology/STEMI.  Signed, Raphael LOISE Bring, PA-C 11/04/2024, 8:59 AM     I have seen and examined the patient along with Dayna N Dunn, PA-C.  I have reviewed the chart, notes and new data.  I agree with PA/NP's note.  Key new complaints: Denies shortness of breath.  Able to lie fully supine in bed without difficulty. Key examination changes: Still has mild jugular venous distention.  Chronic trophic skin changes in the lower extremities consistent with longstanding brawny edema, but with substantial wrinkling suggesting edema has improved.  She has lost roughly 9 kg since admission to the hospital.  Weight today is just under 80 kg, roughly 10 kg less than it was at her office appointment in July 2025, at which point she was asymptomatic. Key new findings / data: Native LBBB QRS complex 146 ms, reviewed older ECGs that showed some paced QRS complex about 170 ms.  Note slight upward trend in creatinine and BUN in last 24 hours, although her creatinine is pretty much at baseline and definitely better compared to November.  GFR is 25-30.  PLAN: Improved volume status following aggressive diuresis.  Dry weight is very unclear: Would set a target of less than 180 pounds at least for the time being.  She has clearly lost a lot of real weight.  Transition to oral diuretics yesterday. No discussion regarding limitations in use of medications for heart failure due to advanced chronic kidney disease, as well as concerns regarding invasive procedures such as CRT-P upgrade, due to her numerous comorbid conditions.  Native QRS complex while broad (146 ms with LBBB pattern) is definitely narrower than her paced QRS complex which is approximately 170 ms.  Pacemaker settings adjusted to allow more intrinsic AV conduction.  Avoid  excessive AV nodal blockade for the same reason.   [facility] will sign off.   Medication Recommendations:   Apixaban  2.5 mg twice daily Carvedilol  6.25 mg twice daily BiDil  20-37.5 mg 3 times daily Torsemide  40 mg daily and potassium chloride  20 mill equivalents daily Other recommendations (labs, testing, etc): Daily weights.  Call us  for weight gain more than 3 pounds in 24 hours, more than 5 pounds in a week or when weight exceeds 180 pounds for 2 consecutive days.  Sodium restricted diet. Follow up as an outpatient: Has appointment scheduled 11/12/2024  Jerel Balding, MD, FACC [facility] [phone] 11/04/2024, 10:02 AM   "

## 2024-11-04 NOTE — Plan of Care (Signed)

## 2024-11-04 NOTE — Progress Notes (Signed)
 The patient is level 2 mobility and incontinent.

## 2024-11-04 NOTE — NC FL2 (Signed)
 "   MEDICAID FL2 LEVEL OF CARE FORM     IDENTIFICATION  Patient Name: [name] Birthdate: May 12, 1933 Sex: female Admission Date (Current Location): 10/29/2024  [place] and [place] Number:  [place] [id number] Facility and Address:  [facility], [address], [place], [place] [address]      Provider Number: [id number]  Attending Physician Name and Address:  Noralee Elidia Sieving,*  Relative Name and Phone Number:  Small,[name]  Daughter, Emergency Contact  [phone] (Mobile)    Current Level of Care: Hospital Recommended Level of Care: Skilled Nursing Facility Prior Approval Number:    Date Approved/Denied:   PASRR Number: [id number]  Discharge Plan: SNF    Current Diagnoses: Patient Active Problem List   Diagnosis Date Noted   Benign hypertension with CKD (chronic kidney disease) stage III (HCC) 11/03/2024   Atrial fibrillation/flutter (HCC) 11/02/2024   Biventricular heart failure (HCC) 11/02/2024   Acute on chronic heart failure with reduced ejection fraction (HFrEF, <= 40%) (HCC) 11/02/2024   Obesity, class 1 10/30/2024   Acute on chronic systolic CHF (congestive heart failure) (HCC) 10/29/2024   Cardiomyopathy (HCC) 09/05/2024   Permanent atrial fibrillation (HCC) 09/05/2024   Long term (current) use of anticoagulants 09/05/2024   Long term current use of antiarrhythmic drug 09/05/2024   Cardiac pacemaker in situ 09/05/2024   Acute combined systolic and diastolic CHF, NYHA class 3 (HCC) 09/05/2024   Chronic systolic heart failure (HCC) 09/04/2024   OSA (obstructive sleep apnea) 07/06/2023   Slurred speech and right facial twitching 06/03/2023   Acute renal failure superimposed on stage 3b chronic kidney disease (HCC) 06/03/2023   Paroxysmal atrial fibrillation (HCC) 06/03/2023   Combined congestive systolic and diastolic heart failure (HCC) 06/03/2023   Pressure injury of skin 02/10/2022   Melena 02/08/2022   Chest  pain 02/08/2022   Diabetic neuropathy (HCC) 12/10/2021   Diabetic nephropathy (HCC) 12/10/2021   Hyperglycemia due to type 2 diabetes mellitus (HCC) 12/10/2021   Pure hypercholesterolemia 12/10/2021   Osteoarthritis of knee 12/10/2021   Complete heart block (HCC) 05/14/2021   Acute on chronic combined systolic and diastolic CHF (congestive heart failure) (HCC) 10/08/2020   GERD (gastroesophageal reflux disease) 10/08/2020   Pacemaker 05/06/2020   Syncope 08/10/2019   Displaced fracture of lateral malleolus of right fibula, initial encounter for closed fracture 07/10/2019   Pain in left shoulder 07/10/2019   Personal history of breast cancer 04/22/2019   Malignant neoplasm of lower-inner quadrant of right breast of female, estrogen receptor positive (HCC) 11/23/2018   Hypertensive heart disease with heart failure (HCC) 09/05/2017   Aphasia 08/31/2017   Chronic kidney disease, stage 3b (HCC)    LBBB (left bundle branch block)    Other pancytopenia (HCC)    HTN (hypertension)    Obesity    hx of DVT    Type 2 diabetes mellitus with hyperlipidemia (HCC) 07/28/2015   Fall 07/28/2015   Warfarin-induced coagulopathy 07/28/2015   Acute on chronic diastolic CHF (congestive heart failure) (HCC) 03/19/2014   SVT (supraventricular tachycardia) (HCC) 10/15/2012   First degree AV block 10/15/2012    Orientation RESPIRATION BLADDER Height & Weight     Self, Time, Situation, Place  O2 (2L O2 [place]) Continent, External catheter Weight: 175 lb 7.8 oz (79.6 kg) Height:  5' 5 (165.1 cm)  BEHAVIORAL SYMPTOMS/MOOD NEUROLOGICAL BOWEL NUTRITION STATUS      Continent Diet (Please see discharge summary)  AMBULATORY STATUS COMMUNICATION OF NEEDS Skin  Verbally Normal                       Personal Care Assistance Level of Assistance  Bathing, Feeding, Dressing Bathing Assistance: Limited assistance Feeding assistance: Limited assistance Dressing Assistance: Limited assistance      Functional Limitations Info  Hearing, Sight, Speech Sight Info: Impaired (eyeglasses) Hearing Info: Impaired (R and L) Speech Info: Adequate    SPECIAL CARE FACTORS FREQUENCY                       Contractures Contractures Info: Not present    Additional Factors Info  Code Status, Allergies, Insulin  Sliding Scale Code Status Info: DNR limited Allergies Info: Aspirin, Penicillins, Remeron  (Mirtazapine )   Insulin  Sliding Scale Info: Please see discharge summary       Current Medications (11/04/2024):  This is the current hospital active medication list Current Facility-Administered Medications  Medication Dose Route Frequency Provider Last Rate Last Admin   acetaminophen  (TYLENOL ) tablet 650 mg  650 mg Oral Q6H PRN Adefeso, Oladapo, DO       Or   acetaminophen  (TYLENOL ) suppository 650 mg  650 mg Rectal Q6H PRN Adefeso, Oladapo, DO       apixaban  (ELIQUIS ) tablet 2.5 mg  2.5 mg Oral BID Adefeso, Oladapo, DO   2.5 mg at 11/03/24 2201   carvedilol  (COREG ) tablet 6.25 mg  6.25 mg Oral BID WC Tolia, Sunit, DO   6.25 mg at 11/04/24 9373   docusate sodium  (COLACE) capsule 100 mg  100 mg Oral q morning Adefeso, Oladapo, DO   100 mg at 11/03/24 9083   insulin  aspart (novoLOG ) injection 0-5 Units  0-5 Units Subcutaneous QHS Adefeso, Oladapo, DO   2 Units at 11/03/24 0001   insulin  aspart (novoLOG ) injection 0-9 Units  0-9 Units Subcutaneous TID WC Adefeso, Oladapo, DO   2 Units at 11/04/24 9371   insulin  aspart (novoLOG ) injection 2 Units  2 Units Subcutaneous TID WC Arrien, Mauricio Daniel, MD       insulin  glargine (LANTUS ) injection 8 Units  8 Units Subcutaneous QHS Arrien, Mauricio Daniel, MD       isosorbide -hydrALAZINE  (BIDIL ) 20-37.5 MG per tablet 1 tablet  1 tablet Oral TID Arrien, Elidia Sieving, MD   1 tablet at 11/03/24 2201   metoCLOPramide  (REGLAN ) tablet 5 mg  5 mg Oral TID AC Arrien, Elidia Sieving, MD   5 mg at 11/04/24 9373   ondansetron  (ZOFRAN ) tablet 4 mg  4  mg Oral Q6H PRN Adefeso, Oladapo, DO       Or   ondansetron  (ZOFRAN ) injection 4 mg  4 mg Intravenous Q6H PRN Adefeso, Oladapo, DO       pantoprazole  (PROTONIX ) EC tablet 40 mg  40 mg Oral q morning Adefeso, Oladapo, DO   40 mg at 11/03/24 9082   potassium chloride  SA (KLOR-CON  M) CR tablet 20 mEq  20 mEq Oral Once Arrien, Mauricio Daniel, MD       simethicone  (MYLICON) chewable tablet 80 mg  80 mg Oral QID PRN Arrien, Mauricio Daniel, MD   80 mg at 11/03/24 1243   torsemide  (DEMADEX ) tablet 40 mg  40 mg Oral Daily Tolia, Sunit, DO   40 mg at 11/03/24 1710     Discharge Medications: Please see discharge summary for a list of discharge medications.  Relevant Imaging Results:  Relevant Lab Results:   Additional Information [id number].  Anna Beaird C Wandra Babin, LCSWA     "

## 2024-11-04 NOTE — Progress Notes (Signed)
 Initial Nutrition Assessment  DOCUMENTATION CODES:   Not applicable  INTERVENTION:  Change diet order to dysphagia 3 given pt report of baseline diet and edentulous status Monitor for swallow and consider SLP evaluation if concern for altered swallow function on mechanical soft diet.  Ensure Plus High Protein po at bedtime, each supplement provides 350 kcal and 20 grams of protein.  NUTRITION DIAGNOSIS:   Inadequate oral intake related to social / environmental circumstances as evidenced by per patient/family report.  GOAL:   Patient will meet greater than or equal to 90% of their needs  MONITOR:   PO intake, Supplement acceptance, Labs, Weight trends, I & O's  REASON FOR ASSESSMENT:   Consult Assessment of nutrition requirement/status  ASSESSMENT:   Pt admitted with c/o lower extremity edema, progressing to dyspnea on exertion r/t volume overload and heart failure. PMH significant for afib, SVT, heart failure, CKD, CVA, T2DM, HTN, heart block s/p pacemaker.  Echo with reduced LV systolic function 30 to 35%, global hypokinesis, mild dilatated LV cavity, moderate LVH, RV systolic function with moderate reduction, LA with severe dilatation and moderate dilatation RA, small pericardial effusion, mild mitral valve regurgitation   Remains admitted for continued diuresis.   Pt is from SNF. He is wheelchair bound.   Meal completions: 1/8: 75% breakfast, 100% lunch 1/9: 25% dinner 1/10: 10% breakfast, 100% lunch 1/11: 100% breakfast 1/12: 100% breakfast  Of note, CBG's yesterday appeared to remain elevated. DM coordinator following with recommendations for meal coverage and increase in long acting coverage. Discussed with MD and DM coordinator.   Patient sitting up in bedside recliner at time of visit. She is in good spirits. She reports a long history of difficulty with foods getting stuck in esophageal region + reflux. She reports being followed with speech therapy  outpatient. She had been on pureed diet and had been upgraded to dysphagia 3/mechanical soft and prefers this diet.   At her facility she reports the food is too salty and only eats about </= 50% of meals. Breakfast she enjoys eating cereal with boiled egg and fruit. Lunch and dinner vary.   She mentions that her blood sugar is very sensitive to what she eats and is easily elevated. She often has low blood sugar at night, identified with presence of abdominal itching. She will alert her nurse when this happens and her blood sugar will be low. Pt reports that she will often have milk and cookies to help bring her blood sugar back up.   Discussed with patient adding a night time nutrition supplement to help with stabilization of her night time blood sugar.  Her dry weight is unknown. Despite presence of edema, pt's weight is lowest it has been documented (79.6 kg) within the last year. Pt's highest weight noted to be 92.5 kg on 10/25/24. This would reflect a significant weight loss however given this is likely d/t diuresing, it is difficult to determine how much of current weight loss is d/t fluid.   + edema: moderate pitting BLE  Medications: colace daily, SSI 0-5 units at bedtime, SSI 0-9 units TID, semglee  6 units daily, reglan  TID, torsemide  40mg  daily  Labs:   BUN 24 Cr 1.72 GFR 28 CBG's 163-332 x24 hours HgbA1c (09/04/24) 9.8%   NUTRITION - FOCUSED PHYSICAL EXAM:  Flowsheet Row Most Recent Value  Orbital Region No depletion  Upper Arm Region Mild depletion  Thoracic and Lumbar Region No depletion  Buccal Region No depletion  Temple Region No depletion  Clavicle Bone Region Mild depletion  Clavicle and Acromion Bone Region No depletion  Scapular Bone Region No depletion  Dorsal Hand Moderate depletion  Patellar Region Unable to assess  [edema]  Anterior Thigh Region Unable to assess  Posterior Calf Region Unable to assess  Edema (RD Assessment) Moderate  [pitting BLE]  Hair  Reviewed  Eyes Reviewed  Mouth Other (Comment)  [only wears upper dentures]  Skin Reviewed  Nails Reviewed    Diet Order:   Diet Order             DIET DYS 3 Room service appropriate? Yes with Assist; Fluid consistency: Thin; Fluid restriction: 1200 mL Fluid  Diet effective now                   EDUCATION NEEDS:   Education needs have been addressed  Skin:  Skin Assessment: Reviewed RN Assessment  Last BM:  1/10  Height:   Ht Readings from Last 1 Encounters:  10/30/24 5' 5 (1.651 m)    Weight:   Wt Readings from Last 1 Encounters:  11/04/24 79.6 kg    Ideal Body Weight:  56.8 kg  BMI:  Body mass index is 29.2 kg/m.  Estimated Nutritional Needs:   Kcal:  1500-1700  Protein:  75-85g  Fluid:  1.5L  Royce Maris, RDN, LDN Clinical Nutrition See AMiON for contact information.

## 2024-11-04 NOTE — Discharge Summary (Addendum)
 " Physician Discharge Summary   Patient: [name] MRN: [id number] DOB: 04-20-1933  Admit date:     10/29/2024  Discharge date: 11/04/2024  Discharge Physician: [name]   PCP: Pcp, No   Recommendations at discharge:    Patient will continue bidil , and carvedilol  for heart failure, limited therapy due to reduced GFR. Diuresis with torsemide  40 mg po daily. Please weight every morning, in case of weight gain 2 to 3 lbs in 24 hrs or 5 lbs in 7 days, call cardiology and take torsemide  40 mg twice day until weight back to baseline.  Follow up renal function and electrolytes in 7 days as outpatient Follow up with primary care in 7 to 10 days as outpatient Follow up with cardiology as scheduled Follow up with palliative care as outpatient.   I spoke with patient's granddaughter over the phone, we talked in detail about patient's condition, plan of care and prognosis and all questions were addressed.  Discharge Diagnoses: Principal Problem:   Acute on chronic systolic CHF (congestive heart failure) (HCC) Active Problems:   Paroxysmal atrial fibrillation (HCC)   HTN (hypertension)   Chronic kidney disease, stage 3b (HCC)   Type 2 diabetes mellitus with hyperlipidemia (HCC)   GERD (gastroesophageal reflux disease)   hx of DVT   Malignant neoplasm of lower-inner quadrant of right breast of female, estrogen receptor positive (HCC)   Obesity, class 1   Atrial fibrillation/flutter (HCC)   Biventricular heart failure (HCC)   Acute on chronic heart failure with reduced ejection fraction (HFrEF, <= 40%) (HCC)   Benign hypertension with CKD (chronic kidney disease) stage III (HCC)  Resolved Problems:   * No resolved hospital problems. [facility] Course: [name] was admitted to the hospital with the working diagnosis of heart failure   89 yo female nursing home resident, wheelchair bound, with the past medical history of atrial fibrillation, SVT, heart failure, CKD, CVA,  T2DM, hypertension, heart block sp pacemaker, who presents with dyspnea.  Reported 4 days of lower extremity edema, that progressed to dyspnea on exertion, despite increase in her oral diuretic therapy. She was evaluated by cardiology on the day of admission as outpatient, she was found volume overloaded and refer to the ED.  On her initial physical examination her blood pressure was 152/90, HR 102, RR 33 and 02 saturation was 89% Lungs with diffuse rales, heart with S1 and S2 present and irregular, abdomen soft not distended, and positive lower extremity edema +++  Na 146, K 3.7 Cl 104 bicarbonate 30 glucose 130 bun 32 cr 1,68  BNP 7,389  High sensitive troponin 68 and 66 Wbc 3,7 hgb 10.0 plt 143   Chest radiograph with hypoinflation, left rotation, positive cardiomegaly, with bilateral hilar vascular congestion, left pleural effusion.   EKG 64 bpm, left axis deviation, left bundle branch block, qtc 480, atrial flutter with variable block, intermittent ventricular pacing, with no significant ST segment or T wave changes.   Patient placed on furosemide  for diuresis,   01/09 responding to diuresis.  01/10 continue need for IV diuresis.  01/11 continue diuresis  01/12 improved volume status, plan to continue diuresis and have close follow up. Plan for outpatient palliative care.   Assessment and Plan: * Acute on chronic systolic CHF (congestive heart failure) (HCC) Limited echocardiogram with reduced LV systolic function 30 to 35%, global hypokinesis, mild dilatated LV cavity, moderate LVH, RV systolic function with moderate reduction, LA with severe dilatation and moderate dilatation RA,  small pericardial effusion, mild mitral valve regurgitation   Patient was placed on IV furosemide  for diuresis, negative fluid balance was achieved, - 5,483 ml, with significant improvement in her symptoms.   Continue afterload reduction with hydralazine / isosorbide .  Carvedilol  6.25 mg bid  Transitioned  to torsemide  40 mg po daily.  Plan to increase torsemide  to 40 po bid in case of volume overload, weight gain 2 to 3 lbs in 24 hrs or 5 lbs in 7 days  Limited therapy due to reduced GFR.   Paroxysmal atrial fibrillation (HCC) Continue amiodarone  for rate control  She has pacemaker in place.  Continue anticoagulation with apixaban .  HTN (hypertension) Continue hydralazine / isosorbide  and carvedilol  Loop diuretic with torsemide    Chronic kidney disease, stage 3b (HCC) Hypernatremia, hypokalemia Baseline serum cr is about 1.5 (per old records review)  At the time of her discharge her serum cr is 1,72 with K at 3,5 and serum bicarbonate at 33,  Na 142 P 4.1   Continue diuresis with torsemide  Follow up renal function and electrolytes in 7 days as outpatient   Type 2 diabetes mellitus with hyperlipidemia (HCC) Patient was placed on insulin  sliding scale for glucose cover and monitoring, plus basal insulin . Her glucose remained well controlled, plan to continue her usual insulin  regimen at SNF   Continue statin therapy   GERD (gastroesophageal reflux disease) Continue pantoprazole    hx of DVT Continue anticoagulation with apixaban    Malignant neoplasm of lower-inner quadrant of right breast of female, estrogen receptor positive (HCC) Follow as outpatient.   Obesity, class 1 Calculated BMI 33.4   Consultants: cardiology  Procedures performed: none   Disposition: Skilled nursing facility Diet recommendation:  Cardiac and Carb modified diet fluid restriction 1200 ml per day  DISCHARGE MEDICATION: Allergies as of 11/04/2024       Reactions   Aspirin Itching   Penicillins Itching, Rash   DID THE REACTION INVOLVE: Swelling of the face/tongue/throat, SOB, or low BP? Yes Sudden or severe rash/hives, skin peeling, or the inside of the mouth or nose? No Did it require medical treatment? Yes When did it last happen? More than 10 years ago If all above answers are NO, may  proceed with cephalosporin use. Tolerated full course of Unasyn  in 2021.   Remeron  [mirtazapine ] Other (See Comments)   Unknown reaction        Medication List     TAKE these medications    acetaminophen  325 MG tablet Commonly known as: TYLENOL  Take 650 mg by mouth in the morning, at noon, and at bedtime.   amiodarone  200 MG tablet Commonly known as: PACERONE  Take one tablet by mouth daily Monday-Friday. Do NOT take Saturday and Sunday   ascorbic acid 500 MG tablet Commonly known as: VITAMIN C Take 500 mg by mouth at bedtime.   Biofreeze Cool The Pain 4 % Gel Generic drug: Menthol (Topical Analgesic) Apply 1 application  topically every 12 (twelve) hours as needed (for osteoarthritis).   carvedilol  6.25 MG tablet Commonly known as: COREG  Take 6.25 mg by mouth 2 (two) times daily with a meal.   Docusate Sodium  100 MG capsule Take 100 mg by mouth every morning.   Eliquis  2.5 MG Tabs tablet Generic drug: apixaban  TAKE 1 TABLET(2.5 MG) BY MOUTH TWICE DAILY   feeding supplement Liqd Take 237 mLs by mouth at bedtime.   fluticasone-salmeterol 250-50 MCG/ACT Aepb Commonly known as: ADVAIR Inhale 1 puff into the lungs 2 (two) times daily.   FreeStyle Pettit 3  Sensor Misc Inject 1 Device into the skin every 14 (fourteen) days.   insulin  glargine 100 UNIT/ML injection Commonly known as: LANTUS  Inject 7-12 Units into the skin See admin instructions. Inject 12 units into the skin in the morning and 7 units in the evening   insulin  lispro 100 UNIT/ML KwikPen Commonly known as: HumaLOG  KwikPen Before each meal 3 times a day, 140-199 - 2 units, 200-250 - 4 units, 251-299 - 6 units,  300-349 - 8 units,  350 or above 10 units. What changed:  how much to take how to take this when to take this additional instructions   ipratropium-albuterol  0.5-2.5 (3) MG/3ML Soln Commonly known as: DUONEB Take 3 mLs by nebulization every 4 (four) hours as needed (for shortness of  breath).   isosorbide -hydrALAZINE  20-37.5 MG tablet Commonly known as: BIDIL  Take 1 tablet by mouth 3 (three) times daily.   lidocaine 5 % Commonly known as: LIDODERM Place 1 patch onto the skin daily. Apply to left posterior shoulder--Remove & Discard patch within 12 hours or as directed by MD   metoCLOPramide  5 MG tablet Commonly known as: REGLAN  Take 1 tablet (5 mg total) by mouth every 8 (eight) hours as needed for nausea (before meals as needed for nausea).   mirabegron  ER 25 MG Tb24 tablet Commonly known as: MYRBETRIQ  Take 25 mg by mouth daily.   omeprazole  20 MG tablet Commonly known as: PRILOSEC OTC Take 20 mg by mouth every morning.   polyethylene glycol 17 g packet Commonly known as: MIRALAX  / GLYCOLAX  Take 17 g by mouth daily as needed for mild constipation.   potassium chloride  SA 20 MEQ tablet Commonly known as: KLOR-CON  M Take 1 tablet (20 mEq total) by mouth daily.   pregabalin  25 MG capsule Commonly known as: LYRICA  Take 1 capsule (25 mg total) by mouth 2 (two) times daily.   simethicone  80 MG chewable tablet Commonly known as: MYLICON Chew 1 tablet (80 mg total) by mouth 4 (four) times daily.   SYSTANE COMPLETE OP Place 1 drop into both eyes as needed (for itching and dry eye).   Torsemide  40 MG Tabs Take 40 mg by mouth daily. What changed:  medication strength See the new instructions.        Follow-up Information     Campbell, Kenzie E, NP Follow up.   Specialty: Cardiology Why: Davene Nicolas - cardiology follow-up scheduled on Tuesday Nov 12, 2024 2:15 PM (Arrive by 1:55 PM). Contact information: [address] [place] [address] [phone]                Discharge Exam: Filed Weights   11/02/24 0351 11/03/24 0458 11/04/24 0500  Weight: 88.1 kg 82.8 kg 79.6 kg   BP (!) 153/95 (BP Location: Left Arm)   Pulse 65   Temp 97.8 F (36.6 C) (Oral)   Resp 18   Ht 5' 5 (1.651 m)   Wt 79.6 kg   SpO2 98%    BMI 29.20 kg/m   Patient is feeling better, dyspnea and edema has improved, no nausea or vomiting, no PND, or orthopnea.   Neurology awake and alert ENT with mild pallor with no icterus.  Cardiovascular with S1 and S2 present, irregular with no gallops, rubs or murmurs No JVD Respiratory with no rales or wheezing,  Abdomen with no distention  Lower extremity with no pitting edema, mild component of lymphedema    Condition at discharge: stable  The results of significant diagnostics from  this hospitalization (including imaging, microbiology, ancillary and laboratory) are listed below for reference.   Imaging Studies: ECHOCARDIOGRAM LIMITED Result Date: 10/30/2024    ECHOCARDIOGRAM LIMITED REPORT   Patient Name:   [name] Date of Exam: 10/30/2024 Medical Rec #:  [id number]    Height:       65.0 in Accession #:    [id number]   Weight:       201.0 lb Date of Birth:  Feb 09, 1933    BSA:          1.982 m Patient Age:    89 years     BP:           173/69 mmHg Patient Gender: F            HR:           48 bpm. Exam Location:  Inpatient Procedure: Cardiac Doppler, Color Doppler, Intracardiac Opacification Agent and            Limited Echo (Both Spectral and Color Flow Doppler were utilized            during procedure). Indications:    CHF I50.21  History:        Patient has prior history of Echocardiogram examinations, most                 recent 09/04/2024. CHF, Pacemaker; Arrythmias:Atrial                 Fibrillation.  Sonographer:    Tinnie Gosling RDCS Referring Phys: [id number] SHENG L HALEY IMPRESSIONS  1. Left ventricular ejection fraction, by estimation, is 30 to 35%. The left ventricle has moderately decreased function. The left ventricle demonstrates global hypokinesis. The left ventricular internal cavity size was mildly dilated. There is moderate  concentric left ventricular hypertrophy. Left ventricular diastolic parameters are indeterminate.  2. Right ventricular systolic function is moderately  reduced.  3. Left atrial size was severely dilated.  4. Right atrial size was moderately dilated.  5. A small pericardial effusion is present. The pericardial effusion is posterior to the left ventricle. There is no evidence of cardiac tamponade.  6. The mitral valve is degenerative. Mild mitral valve regurgitation.  7. The aortic valve is tricuspid. There is mild calcification of the aortic valve. Aortic valve sclerosis/calcification is present, without any evidence of aortic stenosis.  8. The inferior vena cava is dilated in size with <50% respiratory variability, suggesting right atrial pressure of 15 mmHg. FINDINGS  Left Ventricle: Left ventricular ejection fraction, by estimation, is 30 to 35%. The left ventricle has moderately decreased function. The left ventricle demonstrates global hypokinesis. The left ventricular internal cavity size was mildly dilated. There is moderate concentric left ventricular hypertrophy. Left ventricular diastolic parameters are indeterminate. Right Ventricle: Right ventricular systolic function is moderately reduced. Left Atrium: Left atrial size was severely dilated. Right Atrium: Right atrial size was moderately dilated. Pericardium: A small pericardial effusion is present. The pericardial effusion is posterior to the left ventricle. There is no evidence of cardiac tamponade. Mitral Valve: The mitral valve is degenerative in appearance. Mild mitral valve regurgitation. Aortic Valve: The aortic valve is tricuspid. There is mild calcification of the aortic valve. Aortic valve sclerosis/calcification is present, without any evidence of aortic stenosis. Venous: The inferior vena cava is dilated in size with less than 50% respiratory variability, suggesting right atrial pressure of 15 mmHg. IAS/Shunts: There is right bowing of the interatrial septum, suggestive of elevated left atrial pressure. Additional Comments:  A device lead is visualized.  LEFT VENTRICLE PLAX 2D LVIDd:          4.70 cm LVIDs:         3.60 cm LV PW:         1.40 cm LV IVS:        1.40 cm LVOT diam:     2.00 cm LVOT Area:     3.14 cm  LV Volumes (MOD) LV vol d, MOD A4C: 146.0 ml LV vol s, MOD A4C: 95.7 ml LV SV MOD A4C:     146.0 ml IVC IVC diam: 2.90 cm  AORTA Ao Root diam: 3.00 cm MITRAL VALVE MV Area (PHT): 4.36 cm     SHUNTS MV Decel Time: 174 msec     Systemic Diam: 2.00 cm MV E velocity: 110.00 cm/s MV A velocity: 54.00 cm/s MV E/A ratio:  2.04 Toribio Fuel MD Electronically signed by Toribio Fuel MD Signature Date/Time: 10/30/2024/11:03:56 PM    Final    DG Chest 2 View Result Date: 10/29/2024 CLINICAL DATA:  Shortness of breath. EXAM: CHEST - 2 VIEW COMPARISON:  September 03, 2024 FINDINGS: There is stable dual lead AICD positioning. The cardiac silhouette is enlarged and unchanged in size. Mild perihilar prominence of the pulmonary vasculature is seen. Mild to moderate severity diffusely increased interstitial lung markings are present. Mild linear scarring and/or atelectasis is noted within the mid left lung and bilateral lung bases. No pleural effusion or pneumothorax is identified. There is scoliosis of the thoracic spine with multilevel degenerative changes. IMPRESSION: 1. Cardiomegaly with mild to moderate severity interstitial edema. 2. Mild mid left lung and bibasilar linear scarring and/or atelectasis. Electronically Signed   By: Suzen Dials M.D.   On: 10/29/2024 13:34    Microbiology: Results for orders placed or performed during the hospital encounter of 10/29/24  MRSA Next Gen by PCR, Nasal     Status: None   Collection Time: 10/31/24  8:10 AM   Specimen: Nasal Mucosa; Nasal Swab  Result Value Ref Range Status   MRSA by PCR Next Gen NOT DETECTED NOT DETECTED Final    Comment: (NOTE) The GeneXpert MRSA Assay (FDA approved for NASAL specimens only), is one component of a comprehensive MRSA colonization surveillance program. It is not intended to diagnose MRSA infection nor to  guide or monitor treatment for MRSA infections. Test performance is not FDA approved in patients less than 70 years old. Performed at [facility], [address], [place], [place] [address]     Labs: CBC: Recent Labs  Lab 10/29/24 1340 10/30/24 0544  WBC 3.7* 3.8*  HGB 10.0* 9.3*  HCT 32.6* 30.9*  MCV 93.7 93.6  PLT 143* 129*   Basic Metabolic Panel: Recent Labs  Lab 10/30/24 0544 10/31/24 1603 11/01/24 0259 11/02/24 0202 11/03/24 0232 11/04/24 0154  NA 149* 144 147* 146* 144 142  K 3.6 3.6 3.3* 3.5 3.4* 3.5  CL 107 103 104 105 101 98  CO2 31 31 35* 32 34* 33*  GLUCOSE 160* 269* 158* 170* 166* 193*  BUN 32* 27* 27* 25* 23 24*  CREATININE 1.63* 1.44* 1.50* 1.45* 1.54* 1.72*  CALCIUM  8.9 8.8* 8.5* 8.5* 8.9 9.1  MG 2.3 2.1  --  2.1 2.1  --   PHOS 4.6  --   --   --   --  4.1   Liver Function Tests: Recent Labs  Lab 10/30/24 0544 11/04/24 0154  AST 23  --   ALT  20  --   ALKPHOS 69  --   BILITOT 0.5  --   PROT 6.6  --   ALBUMIN  3.8 3.5   CBG: Recent Labs  Lab 11/02/24 2353 11/03/24 0552 11/03/24 1123 11/03/24 1622 11/03/24 2155  GLUCAP 218* 163* 332* 209* 175*    Discharge time spent: greater than 30 minutes.  Signed: Elidia Toribio Furnace, MD [facility] 11/04/2024 "

## 2024-11-04 NOTE — Care Management Important Message (Signed)
 Important Message  Patient Details  Name: [name] MRN: [id number] Date of Birth: [birth year]   Important Message Given:  Yes - Medicare IM     [name] 11/04/2024, 11:42 AM

## 2024-11-04 NOTE — TOC Progression Note (Addendum)
 Transition of Care [facility]) - Progression Note    Patient Details  Name: [name] MRN: [id number] Date of Birth: Apr 02, 1933  [facility] CM/SW Contact  [name], [place] Phone Number: 11/04/2024, 11:03 AM  Clinical Narrative:   Per MD, patient is medically stable for dc today. MD recommending patient be followed by Palliative at SNF. CSW spoke with patients daughter, [name], and she is agreeable with patient discharging to facility and Palliative recommendation.    Per Soy, AD at [name], patient can return today. Facility uses [facility] for Palliative services. CSW initiated referral.  1:14 PM CSW received notice from unit secretary that patients wheelchair was left at bedside by [facility] as they are not able to take DME. CSW attempted to call patients daughter but she did not answer and no VM is setup. CSW notified facility.   Expected Discharge Plan: Skilled Nursing Facility Barriers to Discharge: Continued Medical Work up               Expected Discharge Plan and Services In-house Referral: Clinical Social Work   Post Acute Care Choice: Skilled Nursing Facility Living arrangements for the past 2 months: Skilled Nursing Facility                                       Social Drivers of Health (SDOH) Interventions SDOH Screenings   Food Insecurity: No Food Insecurity (10/30/2024)  Housing: Low Risk (10/30/2024)  Recent Concern: Housing - High Risk (09/04/2024)  Transportation Needs: No Transportation Needs (10/30/2024)  Utilities: Not At Risk (10/30/2024)  Alcohol Screen: Low Risk (09/05/2024)  Financial Resource Strain: Low Risk (09/05/2024)  Social Connections: Moderately Isolated (10/30/2024)  Tobacco Use: Low Risk (10/29/2024)    Readmission Risk Interventions     No data to display

## 2024-11-05 ENCOUNTER — Ambulatory Visit: Payer: Self-pay | Admitting: Cardiology

## 2024-11-05 LAB — CUP PACEART REMOTE DEVICE CHECK
Battery Voltage: 55
Date Time Interrogation Session: 20260113104613
Implantable Lead Connection Status: 753985
Implantable Lead Connection Status: 753985
Implantable Lead Implant Date: 20200624
Implantable Lead Implant Date: 20200624
Implantable Lead Location: 753859
Implantable Lead Location: 753860
Implantable Lead Model: 377
Implantable Lead Model: 377
Implantable Lead Serial Number: 81195510
Implantable Pulse Generator Implant Date: 20200624
Pulse Gen Model: 407145
Pulse Gen Serial Number: 69638782

## 2024-11-06 LAB — LAB REPORT - SCANNED: EGFR: 16

## 2024-11-06 NOTE — Progress Notes (Signed)
 Remote PPM Transmission

## 2024-11-09 LAB — LAB REPORT - SCANNED: EGFR: 17

## 2024-11-10 NOTE — Progress Notes (Unsigned)
 " Cardiology Office Note:    Date:  11/12/2024   ID:  [name], DOB 1933-04-17, MRN [id number]  PCP:  [name]   [facility] Providers Cardiologist:  Stephanie Large, DO Electrophysiologist:  Stephanie Birmingham, MD     Referring MD: No ref. provider found   Chief complaint: Follow-up recent hospitalization     History of Present Illness:   [name] is a 89 y.o. female with a hx of permanent A-fib on Amio, SVT, complete heart block s/p PPM, known LBBB, chronic combined CHF, history of DVT on Eliquis , hypertension, CKD stage 3b, chronic anemia/thrombocytopenia and diabetes presenting the office today for follow-up of recent hospitalization secondary to CHF exacerbation.   Previously a patient of Dr. Dann.  EP study with ablation for SVT in 2013, did not eliminate SVT completely, subsequent bradycardia, placed on amiodarone .  2018 there was concern for stroke 2/2 stuttering and swallowing difficulties, unremarkable carotid Doppler study and echocardiogram showing moderate focal basal hypertrophy of the septum with LVEF 50-55%, no source of emboli.  Admitted in August 2020 for CHF requiring diuresis, echo demonstrating a small pericardial effusion and circular mass attached to the pacemaker lead in the right atrium. Per Dr. Ceola note, In the absence of fever or sign of sepsis I would not pursue further work-up at this point given the patient age and comorbidities.  I would consider TEE once any signs of infection. She recommended to restart oral Bumex  after a 1 day holiday from diuresis.  PPM was inserted at some point in the past in [place]  following an episode of complete heart block, followed by Dr. Birmingham with our EP group, pacer functioning normally in 2020.  Diagnosed with breast cancer, ultimately did not have surgery due to concerns about poor outcomes with anesthesia, treated with anastrozole .     Presented to the ED September 03, 2024 with complaints of shortness of breath  following a an upper respiratory infection, with complaints of lower extremity edema and orthopnea.  BNP 383, Hgb 10.9, creatinine 1.38, GFR 37, troponin 55, EKG atrial flutter, 66 bpm, LBBB, CXR demonstrating cardiomegaly with pulmonary vascular congestion.  Echo 09/04/2024: LVEF 30-35%, moderately decreased LV function, no RWMA, moderate concentric LVH, G2 DD, RV SF mildly reduced, normal RV size, moderately elevated PASP, RV pressure 49 mmHg, LA severely dilated, small pericardial effusion, mild mitral regurgitation, normal MV, normal AV, RA pressure 15 mmHg.  Diuresed in the hospital.  Discharged on Coreg  and BiDil .  Not a candidate for SGLT2 inhibitors, ARB's, Entresto or Aldactone  due to potassium levels and CKD.  Discharged with follow-up to advanced heart failure clinic.   TOC clinic evaluation on 09/23/2024 demonstrated patient improving following discharge.  Question pacing involvement with decreased EF, referred to EP for setting adjustment versus evaluation for candidacy for upgrade to left bundle lead.  Recommended PYP testing and cardiology follow-up, with unremarkable SPEP and multiple myeloma labs.  Most recently seen in the office by myself on 10/29/2024, appeared volume overloaded at that time.  Recommended proceeding to the emergency department for further diuresis.  She arrived to the emergency department via EMS shortly after her visit, HR 102, RR 33, SpO2 89% on RA.  BNP 7389, troponin 68-66, Hgb 10.0, CXR with hypoinflation, left rotation, positive cardiomegaly, bihilar vascular congestion, left pleural effusion.  EKG demonstrating atrial flutter with variable block, intermittent ventricular pacing, no significant ST or T wave changes, 64 bpm, left axis deviation, LBBB.  She was admitted and continued  to improve following diuresis over the course of the next 5 days.   Overall, -5,483 ml, with significant improvement in symptoms.  She was transition to torsemide  40 mg p.o. daily, with  instructions to increase torsemide  to twice daily with weight guiding parameters.  She was seen by EP, device reprogrammed to allow for intrinsic conduction to reduce likelihood of pacing induced CM, although her decline in LVEF was believed to be 2/2 NICM with conduction disease rather than pacer induced.  There was discussion for consideration of device upgrade to CRT-P, however for this was not pursued due to multiple comorbidities and severe functional limitations.  Medical therapy was recommended for treatment of her heart failure. Therapy limited due to reduced GFR.  Discharged on amiodarone , Eliquis , carvedilol , isosorbide  dinitrate-hydralazine , potassium supplementation, torsemide . Discharged with plan for outpatient palliative care.   Presents with her granddaughter.  Reports a couple days following discharge from hospital patient did experience a brief syncopal episode precipitated by diaphoresis and lightheadedness.  It was uncertain whether this was secondary to hypoglycemia or hypotension.  Labs drawn at facility on 11/06/2024 (DC from hospital on 11/04/2024) showed new creatinine of 2.8, eGFR 16.  Torsemide  was held, labs were repeated on 11/09/2024, creatinine 2.66, eGFR 17.  Patient reports weights have steadily been increasing.  Discharge weight in hospital 175 pounds, today 187 pounds.  Patient reports she feels mostly okay, denies any further episodes of syncope or near syncope.  Denies chest pain.  Reports SOB with exertion, not at rest.  Orthopnea is regularly present, denies PND.  Denies palpitations, dark/tarry/bloody stools, hematuria.  Lower extremity edema appears improved from prior visit, however patient states is returning slowly.  Granddaughter is concerned for possibility of repeat hospitalization.  They have their first appointment with the palliative care team tomorrow.  ROS:   Please see the history of present illness.    All other systems reviewed and are negative.     Past  Medical History:  Diagnosis Date   Abnormal echocardiogram    a. possible mass on echo 05/2019 on PPM, b. 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 Previously diastolic but patient AVS from outside hospital listed systolic CHF and cardiomyopathy, records pending.   CKD (chronic kidney disease), stage III (HCC)    Clotting disorder    CVA (cerebral vascular accident) (HCC)    residual right sided weakness and mild dysphagia   Diabetes mellitus (HCC)    Dizziness and giddiness    DVT (deep venous thrombosis) (HCC)    a. on anticoagulation for this.   Essential hypertension    LBBB (left bundle branch block)    Mitral regurgitation    a. Mod MR by echo 2014.   Muscular deconditioning    Obesity    Pacemaker    Pericardial effusion    SVT (supraventricular tachycardia)    a. In 2013 she had an EPS with ablation for SVT which did not eliminate the SVT completely. She also had bradycardia which limited medication. She was placed on amiodarone  by Dr. Waddell.   Thrombocytopenia 11/21/2011    Past Surgical History:  Procedure Laterality Date   BREAST BIOPSY Right 11/19/2018   positive   ESOPHAGOGASTRODUODENOSCOPY (EGD)  WITH PROPOFOL  N/A 02/10/2022   Procedure: ESOPHAGOGASTRODUODENOSCOPY (EGD) WITH PROPOFOL ;  Surgeon: Rollin Dover, MD;  Location: [facility] ENDOSCOPY;  Service: Gastroenterology;  Laterality: N/A;   EYE SURGERY     SUPRAVENTRICULAR TACHYCARDIA ABLATION N/A 10/11/2012   Procedure: SUPRAVENTRICULAR TACHYCARDIA ABLATION;  Surgeon: Stephanie LELON Birmingham, MD;  Location: [facility] CATH LAB;  Service: Cardiovascular;  Laterality: N/A;    Current Medications: Active Medications[1]   Allergies:   Aspirin, Penicillins, Lisinopril , and Remeron  Audi.bidding ]   Social History    Socioeconomic History   Marital status: Widowed    Spouse name: Not on file   Number of children: 3   Years of education: Not on file   Highest education level: High school graduate  Occupational History   Occupation: retired  Tobacco Use   Smoking status: Never   Smokeless tobacco: Never  Vaping Use   Vaping status: Never Used  Substance and Sexual Activity   Alcohol use: No   Drug use: No   Sexual activity: Not Currently  Other Topics Concern   Not on file  Social History Narrative   Lives at home with grandchild, daughter comes and help    Social Drivers of Health   Tobacco Use: Low Risk (11/12/2024)   Patient History    Smoking Tobacco Use: Never    Smokeless Tobacco Use: Never    Passive Exposure: Not on file  Financial Resource Strain: Low Risk (09/05/2024)   Overall Financial Resource Strain (CARDIA)    Difficulty of Paying Living Expenses: Not very hard  Food Insecurity: No Food Insecurity (10/30/2024)   Epic    Worried About Radiation Protection Practitioner of Food in the Last Year: Never true    Ran Out of Food in the Last Year: Never true  Transportation Needs: No Transportation Needs (10/30/2024)   Epic    Lack of Transportation (Medical): No    Lack of Transportation (Non-Medical): No  Physical Activity: Not on file  Stress: Not on file  Social Connections: Moderately Isolated (10/30/2024)   Social Connection and Isolation Panel    Frequency of Communication with Friends and Family: More than three times a week    Frequency of Social Gatherings with Friends and Family: Never    Attends Religious Services: Never    Database Administrator or Organizations: No    Attends Banker Meetings: 1 to 4 times per year    Marital Status: Widowed  Depression (PHQ2-9): Not on file  Alcohol Screen: Low Risk (09/05/2024)   Alcohol Screen    Last Alcohol Screening Score (AUDIT): 0  Housing: Low Risk (10/30/2024)   Epic    Unable to Pay for Housing in the Last Year: No     Number of Times Moved in the Last Year: 0    Homeless in the Last Year: No  Recent Concern: Housing - High Risk (09/04/2024)   Epic    Unable to Pay for Housing in the Last Year: Yes    Number of Times Moved in the Last Year: Not on file    Homeless in the Last Year: No  Utilities: Not At Risk (10/30/2024)   Epic    Threatened with loss of utilities: No  Health Literacy: Not on file     Family History: The patient's family history includes Breast cancer in her maternal aunt; Cancer in her father; Heart attack in her brother; Hypertension in her brother, daughter, and sister; Stroke in her mother and sister.  EKGs/Labs/Other Studies Reviewed:  The following studies were reviewed today:       Recent Labs: 05/16/2024: TSH 1.730 09/23/2024: B Natriuretic Peptide 665.8 10/29/2024: Pro Brain Natriuretic Peptide 7,389.0 10/30/2024: ALT 20; Hemoglobin 9.3; Platelets 129 11/03/2024: Magnesium  2.1 11/04/2024: BUN 24; Creatinine, Ser 1.72; Potassium 3.5; Sodium 142  Recent Lipid Panel    Component Value Date/Time   CHOL 195 08/15/2018 1236   TRIG 56 01/02/2019 1425   HDL 81 08/15/2018 1236   CHOLHDL 2.4 08/15/2018 1236   CHOLHDL 2.6 12/11/2008 0130   VLDL 13 12/11/2008 0130   LDLCALC 96 08/15/2018 1236     Risk Assessment/Calculations:    CHA2DS2-VASc Score = 8   This indicates a 10.8% annual risk of stroke. The patient's score is based upon: CHF History: 1 HTN History: 1 Diabetes History: 1 Stroke History: 2 Vascular Disease History: 0 Age Score: 2 Gender Score: 1               Physical Exam:    VS:  BP 122/60 (Cuff Size: [name])   Pulse 65   Ht 5' 5 (1.651 m)   Wt 187 lb (84.8 kg)   SpO2 95%   BMI 31.12 kg/m        Wt Readings from Last 3 Encounters:  11/12/24 187 lb (84.8 kg)  11/04/24 175 lb 7.8 oz (79.6 kg)  10/29/24 200 lb (90.7 kg)     GEN:  Well nourished, well developed in no acute distress HEENT: Normal NECK:  No carotid bruits CARDIAC:  S1-S2  normal, irregularly irregular, no murmurs, rubs, gallops RESPIRATORY: Mild basilar posterior Rales, no wheezing or rhonchi  MUSCULOSKELETAL: Moderate pitting edema bilaterally; No deformity  SKIN: Warm and dry NEUROLOGIC:  Alert and oriented x 3 PSYCHIATRIC:  Normal affect       Assessment & Plan Congestive heart failure, unspecified HF chronicity, unspecified heart failure type (HCC) LVEF 30-35%, moderately decreased LV function, no RWMA, moderate concentric LVH, G3 DD, RV SF mildly reduced, moderately elevated PASP, RVSP 49.6 mmHg, LA severely dilated, small pericardial effusion present, mild MV regurg, normal AV, RA pressure 15 mmHg Reports DOE and leg edema, no SOB at rest Moderate pitting edema bilaterally, very mild basilar rales Weight increased from 175 pounds at discharge-187 pounds today Significant decline in kidney function shortly following discharge led to facility holding diuretic Facility performed chest x-ray and repeat lab work last night, do not have results of either of these available to me Discussed case with DOD, Dr. Michele, who recommended decreasing torsemide  to 20 mg daily, may take additional 20 mg as needed weight gain of >3 lb overnight or 5 lb in a week to preserve kidney function Continue Coreg  6.25 mg twice daily, torsemide  as above, potassium chloride  20 mill equivalents daily Will place referral to advanced heart failure team for help with managing diuresis in combination with preserving kidney function Most recent labs performed at skilled nursing facility, will scan and labs received from there today Stage 4 chronic kidney disease (HCC) 1/12 Crt 1.72 -> 1/14 CRT 2.8 -> 1/17 CRT 2.66 1/17 K 4.2 Most recent values performed at SNF, will scan labs into chart Placing urgent referral to nephrology for sudden decline in kidney function, would appreciate recommendations for diuretic management Primary hypertension Historically hypertensive, BPs reaching 178  systolic while admitted 10 days ago Reducing torsemide  as above Recent episode of syncope, possibly related to hypotension vs hypoglycemia, denies any further episodes or continued dizziness/lightheadedness Recommending BP log from facility to be brought next  visit for further titration of antihypertensives Continue carvedilol  6.25 mg twice daily, BiDil  20-37.5 mg 3 times daily PAF (paroxysmal atrial fibrillation) (HCC) Atrial fibrillation/flutter (HCC) Most recent EKG in hospital demonstrating atrial flutter at 61 bpm Pacemaker reprogrammed to allow for intrinsic rhythm while in hospital due to concern for pacing induced CM Continue amiodarone  and Eliquis  as prescribed Will need updated TSH at next visit Syncope, unspecified syncope type Patient reported single brief episode of syncope lasting a few seconds while eating 1-2 days following discharge.  Reports she felt uneasy, broke out in a sweat, then remembers waking up unsure of what happened.  Was sitting when this happened.  Patient reports she was told the nursing staff was unsure whether this was related to hypotension or hypoglycemia.   Patient denies any further episodes of this, denies dizziness/lightheadedness/near syncope, palpitations. Reducing torsemide  dose today, continue to monitor for recurrent symptoms and triggers  Goals of care, counseling/discussion Palliative to care to see them for first appointment tomorrow  Disposition: Follow-up in 2 weeks or sooner if needed, urgent referral placed to nephrology.  Proceed to the ED with any new or worsening symptoms            Medication Adjustments/Labs and Tests Ordered: Current medicines are reviewed at length with the patient today.  Concerns regarding medicines are outlined above.  Orders Placed This Encounter  Procedures   AMB Referral to Advance Heart Failure Pharmacy Clinic   Ambulatory referral to Nephrology   No orders of the defined types were placed in this  encounter.   Patient Instructions  Medication Instructions:  Decrease Torsemide  20 mg daily. May take an additional 20 mg on the days of swelling weight gain of 3 lb over night or 5 lb in 1 week.  *If you need a refill on your cardiac medications before your next appointment, please call your pharmacy*  Lab Work: NONE ordered at this time of appointment   Testing/Procedures: NONE ordered at this time of appointment   Follow-Up: At [facility], you and your health needs are our priority.  As part of our continuing mission to provide you with exceptional heart care, our providers are all part of one team.  This team includes your primary Cardiologist (physician) and Advanced Practice Providers or APPs (Physician Assistants and Nurse Practitioners) who all work together to provide you with the care you need, when you need it.  Your next appointment:    2-3 weeks   Provider:   Madonna Large, DO or APP           We recommend signing up for the patient portal called MyChart.  Sign up information is provided on this After Visit Summary.  MyChart is used to connect with patients for Virtual Visits (Telemedicine).  Patients are able to view lab/test results, encounter notes, upcoming appointments, etc.  Non-urgent messages can be sent to your provider as well.   To learn more about what you can do with MyChart, go to [email or web address].   Other Instructions             Signed, Yaslin Kirtley E Karsen Fellows, NP  11/12/2024 7:13 PM    [facility]     [1]  Current Meds  Medication Sig   acetaminophen  (TYLENOL ) 325 MG tablet Take 650 mg by mouth in the morning, at noon, and at bedtime.   amiodarone  (PACERONE ) 200 MG tablet Take one tablet by mouth daily Monday-Friday. Do NOT take Saturday and Sunday   ascorbic  acid (VITAMIN C) 500 MG tablet Take 500 mg by mouth at bedtime.   carvedilol  (COREG ) 6.25 MG tablet Take 6.25 mg by mouth 2 (two) times daily with a meal.    Continuous Glucose Sensor (FREESTYLE LIBRE 3 SENSOR) MISC Inject 1 Device into the skin every 14 (fourteen) days.   Docusate Sodium  100 MG capsule Take 100 mg by mouth every morning.   ELIQUIS  2.5 MG TABS tablet TAKE 1 TABLET(2.5 MG) BY MOUTH TWICE DAILY   feeding supplement (ENSURE PLUS HIGH PROTEIN) LIQD Take 237 mLs by mouth at bedtime.   fluticasone-salmeterol (ADVAIR) 250-50 MCG/ACT AEPB Inhale 1 puff into the lungs 2 (two) times daily.   insulin  glargine (LANTUS ) 100 UNIT/ML injection Inject 7-12 Units into the skin See admin instructions. Inject 12 units into the skin in the morning and 7 units in the evening   ipratropium-albuterol  (DUONEB) 0.5-2.5 (3) MG/3ML SOLN Take 3 mLs by nebulization every 4 (four) hours as needed (for shortness of breath).   isosorbide -hydrALAZINE  (BIDIL ) 20-37.5 MG tablet Take 1 tablet by mouth 3 (three) times daily.   lidocaine (LIDODERM) 5 % Place 1 patch onto the skin daily. Apply to left posterior shoulder--Remove & Discard patch within 12 hours or as directed by MD   Menthol, Topical Analgesic, (BIOFREEZE COOL THE PAIN) 4 % GEL Apply 1 application  topically every 12 (twelve) hours as needed (for osteoarthritis).   metoCLOPramide  (REGLAN ) 5 MG tablet Take 1 tablet (5 mg total) by mouth every 8 (eight) hours as needed for nausea (before meals as needed for nausea).   mirabegron  ER (MYRBETRIQ ) 25 MG TB24 tablet Take 25 mg by mouth daily.   omeprazole  (PRILOSEC OTC) 20 MG tablet Take 20 mg by mouth every morning.   polyethylene glycol (MIRALAX  / GLYCOLAX ) 17 g packet Take 17 g by mouth daily as needed for mild constipation.   potassium chloride  SA (KLOR-CON  M) 20 MEQ tablet Take 1 tablet (20 mEq total) by mouth daily.   pregabalin  (LYRICA ) 25 MG capsule Take 1 capsule (25 mg total) by mouth 2 (two) times daily.   Propylene Glycol (SYSTANE COMPLETE OP) Place 1 drop into both eyes as needed (for itching and dry eye).   simethicone  (MYLICON) 80 MG chewable tablet  Chew 1 tablet (80 mg total) by mouth 4 (four) times daily.   torsemide  40 MG TABS Take 40 mg by mouth daily. (Patient taking differently: Take 40 mg by mouth daily. Decrease Torsemide  20 mg daily. May take an additional 20 mg on the days of swelling weight gain of 3 lb over night or 5 lb in 1 week.)   "

## 2024-11-12 ENCOUNTER — Ambulatory Visit: Attending: Emergency Medicine | Admitting: Emergency Medicine

## 2024-11-12 ENCOUNTER — Encounter: Payer: Self-pay | Admitting: Emergency Medicine

## 2024-11-12 VITALS — BP 122/60 | HR 65 | Ht 65.0 in | Wt 187.0 lb

## 2024-11-12 DIAGNOSIS — I4892 Unspecified atrial flutter: Secondary | ICD-10-CM | POA: Diagnosis not present

## 2024-11-12 DIAGNOSIS — I480 Paroxysmal atrial fibrillation: Secondary | ICD-10-CM

## 2024-11-12 DIAGNOSIS — R55 Syncope and collapse: Secondary | ICD-10-CM | POA: Diagnosis not present

## 2024-11-12 DIAGNOSIS — I10 Essential (primary) hypertension: Secondary | ICD-10-CM | POA: Diagnosis not present

## 2024-11-12 DIAGNOSIS — I509 Heart failure, unspecified: Secondary | ICD-10-CM | POA: Diagnosis not present

## 2024-11-12 DIAGNOSIS — Z7189 Other specified counseling: Secondary | ICD-10-CM | POA: Diagnosis not present

## 2024-11-12 DIAGNOSIS — N184 Chronic kidney disease, stage 4 (severe): Secondary | ICD-10-CM

## 2024-11-12 DIAGNOSIS — I4891 Unspecified atrial fibrillation: Secondary | ICD-10-CM | POA: Diagnosis not present

## 2024-11-12 NOTE — Assessment & Plan Note (Signed)
 Patient reported single brief episode of syncope lasting a few seconds while eating 1-2 days following discharge.  Reports she felt uneasy, broke out in a sweat, then remembers waking up unsure of what happened.  Was sitting when this happened.  Patient reports she was told the nursing staff was unsure whether this was related to hypotension or hypoglycemia.   Patient denies any further episodes of this, denies dizziness/lightheadedness/near syncope, palpitations. Reducing torsemide  dose today, continue to monitor for recurrent symptoms and triggers

## 2024-11-12 NOTE — Assessment & Plan Note (Signed)
 Most recent EKG in hospital demonstrating atrial flutter at 61 bpm Pacemaker reprogrammed to allow for intrinsic rhythm while in hospital due to concern for pacing induced CM Continue amiodarone  and Eliquis  as prescribed Will need updated TSH at next visit

## 2024-11-12 NOTE — Assessment & Plan Note (Signed)
 Historically hypertensive, BPs reaching 178 systolic while admitted 10 days ago Reducing torsemide  as above Recent episode of syncope, possibly related to hypotension vs hypoglycemia, denies any further episodes or continued dizziness/lightheadedness Recommending BP log from facility to be brought next visit for further titration of antihypertensives Continue carvedilol  6.25 mg twice daily, BiDil  20-37.5 mg 3 times daily

## 2024-11-12 NOTE — Patient Instructions (Signed)
 Medication Instructions:  Decrease Torsemide  20 mg daily. May take an additional 20 mg on the days of swelling weight gain of 3 lb over night or 5 lb in 1 week.  *If you need a refill on your cardiac medications before your next appointment, please call your pharmacy*  Lab Work: NONE ordered at this time of appointment   Testing/Procedures: NONE ordered at this time of appointment   Follow-Up: At [facility], you and your health needs are our priority.  As part of our continuing mission to provide you with exceptional heart care, our providers are all part of one team.  This team includes your primary Cardiologist (physician) and Advanced Practice Providers or APPs (Physician Assistants and Nurse Practitioners) who all work together to provide you with the care you need, when you need it.  Your next appointment:    2-3 weeks   Provider:   Madonna Large, DO or APP           We recommend signing up for the patient portal called MyChart.  Sign up information is provided on this After Visit Summary.  MyChart is used to connect with patients for Virtual Visits (Telemedicine).  Patients are able to view lab/test results, encounter notes, upcoming appointments, etc.  Non-urgent messages can be sent to your provider as well.   To learn more about what you can do with MyChart, go to [email or web address].   Other Instructions

## 2024-11-25 ENCOUNTER — Ambulatory Visit: Admitting: Emergency Medicine

## 2024-12-27 ENCOUNTER — Encounter

## 2025-01-06 ENCOUNTER — Ambulatory Visit: Admitting: Cardiology

## 2025-01-30 ENCOUNTER — Encounter

## 2025-02-06 ENCOUNTER — Inpatient Hospital Stay: Admitting: Hematology and Oncology

## 2025-05-01 ENCOUNTER — Encounter

## 2025-07-31 ENCOUNTER — Encounter
